# Patient Record
Sex: Male | Born: 1955 | Race: White | Hispanic: No | Marital: Married | Smoking: Former smoker
Health system: Southern US, Community
[De-identification: ages and names within clinical notes are randomized; demographics above are authoritative.]

## PROBLEM LIST (undated history)

## (undated) DIAGNOSIS — N189 Chronic kidney disease, unspecified: Secondary | ICD-10-CM

## (undated) DIAGNOSIS — Z87442 Personal history of urinary calculi: Secondary | ICD-10-CM

## (undated) DIAGNOSIS — E785 Hyperlipidemia, unspecified: Secondary | ICD-10-CM

## (undated) DIAGNOSIS — I739 Peripheral vascular disease, unspecified: Secondary | ICD-10-CM

## (undated) DIAGNOSIS — I219 Acute myocardial infarction, unspecified: Secondary | ICD-10-CM

## (undated) DIAGNOSIS — D494 Neoplasm of unspecified behavior of bladder: Secondary | ICD-10-CM

## (undated) DIAGNOSIS — I2510 Atherosclerotic heart disease of native coronary artery without angina pectoris: Secondary | ICD-10-CM

## (undated) DIAGNOSIS — C679 Malignant neoplasm of bladder, unspecified: Secondary | ICD-10-CM

## (undated) DIAGNOSIS — I700 Atherosclerosis of aorta: Secondary | ICD-10-CM

## (undated) DIAGNOSIS — C801 Malignant (primary) neoplasm, unspecified: Secondary | ICD-10-CM

## (undated) DIAGNOSIS — I10 Essential (primary) hypertension: Secondary | ICD-10-CM

## (undated) DIAGNOSIS — N186 End stage renal disease: Secondary | ICD-10-CM

## (undated) DIAGNOSIS — I779 Disorder of arteries and arterioles, unspecified: Secondary | ICD-10-CM

## (undated) DIAGNOSIS — Z973 Presence of spectacles and contact lenses: Secondary | ICD-10-CM

## (undated) HISTORY — DX: Malignant (primary) neoplasm, unspecified: C80.1

## (undated) SURGERY — Surgical Case
Anesthesia: *Unknown

## (undated) SURGICAL SUPPLY — 3 items
BALLN LUTONIX 018 4X150X130 (BALLOONS) ×2 IMPLANT
BALLN LUTONIX 018 5X220X130 (BALLOONS) ×1 IMPLANT
BALLN ULTRVRSE 2.5X300X150 (BALLOONS) ×1 IMPLANT

## (undated) NOTE — *Deleted (*Deleted)
   11/16/2019  CC: No chief complaint on file.  Urologic history: -Hospitalized12/16/2018-12/21/2018to [place] for hematuria and acute kidney injury with creatinine 6.9 and CT showing severe bilateral hydronephrosis and hyperdense material in the bladder with an antecedent 1 year history of intermittent gross hematuria and voiding difficulty.  -Initial placement bilateral percutaneous nephrostomy tubes 02/07/2017  -Cystoscopy with clot evacuation; TURBT performed 02/09/2017  -Intraoperative findings extensive papillary tumor estimated >5 cm occupying the entire trigone and obscuring both ureteral orifice ease. The tumor was incompletely resected  -Pathology:Taurothelial carcinoma of the bladder, low-grade  -Repeat TURBT 03/15/2017 [facility] with findings of extensive tumor over the entire right lateral wall and dome. Estimated approximately 90% of the tumor resected.  -Pathology low-grade urothelial carcinoma; equivocal subepithelial invasion; no muscle invasion noted  -Nephrostomy tubes converted to bilateral ureteral stents 04/06/2017  -Cystoscopy under anesthesia 04/25/2017; 4 cm papillary tumor posterior wall of the bladder entirely resected  -Pathology Taurothelial carcinoma, low-grade; muscle present and not involved  -Ureteral stents removed 05/02/2017  -6-week course of intravesical BCG recommended however patient did not follow-up stating he moved to [place] and did not establish urologic care in the area.  HPI: Anthony Hess is a 15 y.o. male who returns for a cystoscopy.   - Office surveillance cystoscopy performed late August 2020 showed a large amount of sediment and suboptimal visualization.  -He was scheduled for cystoscopy under anesthesia with possible bladder biopsy/TURBT. -He was scheduled in late October 2020 however his preop ECG was abnormal and Myoview showed <30% EF with anterior, apical  and septal scars.   -Cardiac catheterization showed significant two-vessel coronary artery disease and he subsequently underwent CABG in [facility] on 11/4.   -He saw his CT surgeon in late November and was cleared for his urologic procedure. -S/p cystoscopy with bladder biopsies and bilateral retrograde pyelograms with interpretation on 05/15/2019.  -Pathology noted urothelial mucosa with focal cystitis cystica. Negative for atypia and malignancy.    There were no vitals taken for this visit. NED. A&Ox3.   No respiratory distress   Abd soft, NT, ND Normal phallus with bilateral descended testicles  Cystoscopy Procedure Note  Patient identification was confirmed, informed consent was obtained, and patient was prepped using Betadine solution.  Lidocaine jelly was administered per urethral meatus.     Pre-Procedure: - Inspection reveals a normal caliber ureteral meatus.  Procedure: The flexible cystoscope was introduced without difficulty - No urethral strictures/lesions are present. - {Blank multiple:19197::"Enlarged","Surgically absent","Normal"} prostate *** - {Blank multiple:19197::"Normal","Elevated","Tight"} bladder neck - Bilateral ureteral orifices identified - Bladder mucosa  reveals no ulcers, tumors, or lesions - No bladder stones - No trabeculation  Retroflexion shows ***   Post-Procedure: - Patient tolerated the procedure well  Assessment/ Plan:   No follow-ups on file.  Selena Batten

---

## 2017-02-22 HISTORY — PX: TUMOR REMOVAL: SHX12

## 2017-07-07 ENCOUNTER — Ambulatory Visit: Payer: Self-pay | Admitting: Physician Assistant

## 2017-07-26 ENCOUNTER — Ambulatory Visit: Payer: Medicaid Other | Admitting: Physician Assistant

## 2018-01-02 ENCOUNTER — Ambulatory Visit: Payer: Medicaid Other | Admitting: Physician Assistant

## 2018-01-02 NOTE — Progress Notes (Deleted)
       Patient: Anthony Hess, Male    DOB: [birth year], 62 y.o.   MRN: [id number] Visit Date: 01/02/2018  Today's Provider: Trinna Post, PA-C   No chief complaint on file.  Subjective:    New Patient Appointment Anthony Hess is a 62 y.o. male who presents today for new patient appointment. He feels {DESC; WELL/FAIRLY WELL/POORLY:18703}. He reports exercising ***. He reports he is sleeping {DESC; WELL/FAIRLY WELL/POORLY:18703}.  -----------------------------------------------------------------   Review of Systems  Constitutional: Negative.   HENT: Negative.   Eyes: Negative.   Respiratory: Negative.   Cardiovascular: Negative.   Gastrointestinal: Negative.   Endocrine: Negative.   Genitourinary: Negative.   Musculoskeletal: Negative.   Skin: Negative.   Allergic/Immunologic: Negative.   Neurological: Negative.   Hematological: Negative.   Psychiatric/Behavioral: Negative.     Social History He   Social History   Socioeconomic History  . Marital status: Single    Spouse name: Not on file  . Number of children: Not on file  . Years of education: Not on file  . Highest education level: Not on file  Occupational History  . Not on file  Social Needs  . Financial resource strain: Not on file  . Food insecurity:    Worry: Not on file    Inability: Not on file  . Transportation needs:    Medical: Not on file    Non-medical: Not on file  Tobacco Use  . Smoking status: Not on file  Substance and Sexual Activity  . Alcohol use: Not on file  . Drug use: Not on file  . Sexual activity: Not on file  Lifestyle  . Physical activity:    Days per week: Not on file    Minutes per session: Not on file  . Stress: Not on file  Relationships  . Social connections:    Talks on phone: Not on file    Gets together: Not on file    Attends religious service: Not on file    Active member of club or organization: Not on file    Attends meetings of clubs or  organizations: Not on file    Relationship status: Not on file  Other Topics Concern  . Not on file  Social History Narrative  . Not on file    There are no active problems to display for this patient.   *** The histories are not reviewed yet. Please review them in the "History" navigator section and refresh this [place].  Family History  No family status information on file.   His family history is not on file.     Allergies not on file  Previous Medications   No medications on file    Patient Care Team: Paulene Floor as PCP - General (Physician Assistant)      Objective:   Vitals: There were no vitals taken for this visit.   Physical Exam   Depression Screen No flowsheet data found.    Assessment & Plan:     Routine Health Maintenance and Physical Exam  Exercise Activities and Dietary recommendations Goals   None      There is no immunization history on file for this patient.  There are no preventive care reminders to display for this patient.   Discussed health benefits of physical activity, and encouraged him to engage in regular exercise appropriate for his age and condition.    --------------------------------------------------------------------

## 2018-03-15 ENCOUNTER — Ambulatory Visit: Payer: Medicaid Other | Admitting: Family Medicine

## 2018-03-20 ENCOUNTER — Ambulatory Visit: Payer: Medicaid Other | Admitting: Family Medicine

## 2018-03-20 ENCOUNTER — Encounter: Payer: Self-pay | Admitting: Family Medicine

## 2018-03-20 VITALS — BP 156/99 | HR 69 | Temp 97.5°F | Ht 63.6 in | Wt 130.4 lb

## 2018-03-20 DIAGNOSIS — F17210 Nicotine dependence, cigarettes, uncomplicated: Secondary | ICD-10-CM | POA: Diagnosis not present

## 2018-03-20 DIAGNOSIS — Z7689 Persons encountering health services in other specified circumstances: Secondary | ICD-10-CM

## 2018-03-20 DIAGNOSIS — Z8551 Personal history of malignant neoplasm of bladder: Secondary | ICD-10-CM | POA: Diagnosis not present

## 2018-03-20 MED ORDER — NICOTINE 21 MG/24HR TD PT24: 21.0000 mg | MEDICATED_PATCH | Freq: Every day | TRANSDERMAL | 0 refills | Status: DC

## 2018-03-20 NOTE — Assessment & Plan Note (Signed)
Will obtain records from [facility]. Referral placed for further management locally

## 2018-03-20 NOTE — Progress Notes (Signed)
BP (!) 156/99 (BP Location: Right Arm, Cuff Size: Normal)   Pulse 69   Temp (!) 97.5 F (36.4 C)   Ht 5' 3.6" (1.615 m)   Wt 130 lb 6 oz (59.1 kg)   SpO2 98%   BMI 22.66 kg/m    Subjective:    Patient ID: Anthony Hess, male    DOB: 1955-12-14, 63 y.o.   MRN: [id number]  HPI: Anthony Hess is a 63 y.o. male  Chief Complaint  Patient presents with  . Establish Care  . Referral    Patient was diagnosed with bladder cancer in 01/2017, he needs to see a provider that takes medicaid  . Nicotine Dependence    would like to start patches   Here today to establish care.   Was diagnosed in 11/2016 with bladder cancer and had excisional surgery and then some scrapings after that per patient. Was told that he would always have the cancer and require regular scrapings to keep things at [place]. Needs to establish with a local provider - just moved here from [place]. No current sxs.   Tried quitting smoking last year with chantix and patches but stopped them and started back up smoking. Did not have any side effects. Wanting to restart patches.   No other known medical problems. Has not had a CPE in a long time.   Relevant past medical, surgical, family and social history reviewed and updated as indicated. Interim medical history since our last visit reviewed. Allergies and medications reviewed and updated.  Review of Systems  Per HPI unless specifically indicated above     Objective:    BP (!) 156/99 (BP Location: Right Arm, Cuff Size: Normal)   Pulse 69   Temp (!) 97.5 F (36.4 C)   Ht 5' 3.6" (1.615 m)   Wt 130 lb 6 oz (59.1 kg)   SpO2 98%   BMI 22.66 kg/m   Wt Readings from Last 3 Encounters:  03/20/18 130 lb 6 oz (59.1 kg)    Physical Exam Vitals signs and nursing note reviewed.  Constitutional:      Appearance: Normal appearance.  HENT:     Head: Atraumatic.  Eyes:     Extraocular Movements: Extraocular movements intact.     Conjunctiva/sclera: Conjunctivae normal.    Neck:     Musculoskeletal: Normal range of motion and neck supple.  Cardiovascular:     Rate and Rhythm: Normal rate and regular rhythm.  Pulmonary:     Effort: Pulmonary effort is normal.     Breath sounds: Normal breath sounds.  Musculoskeletal: Normal range of motion.  Skin:    General: Skin is warm and dry.  Neurological:     General: No focal deficit present.     Mental Status: He is oriented to person, place, and time.  Psychiatric:        Mood and Affect: Mood normal.        Thought Content: Thought content normal.        Judgment: Judgment normal.     No results found for this or any previous visit.    Assessment & Plan:   Problem List Items Addressed This Visit      Other   Cigarette smoker - Primary    Restart patches, discussed needing to be set on smoking prior to starting treatment. Follow up on prgress at upcoming CPE      History of bladder cancer    Will obtain records from [facility]. Referral  placed for further management locally      Relevant Orders   Ambulatory referral to Urology    Other Visit Diagnoses    Encounter to establish care           Follow up plan: Return for CPE.

## 2018-03-20 NOTE — Assessment & Plan Note (Signed)
Restart patches, discussed needing to be set on smoking prior to starting treatment. Follow up on prgress at upcoming CPE

## 2018-03-23 ENCOUNTER — Encounter: Payer: Medicaid Other | Admitting: Family Medicine

## 2018-03-24 ENCOUNTER — Telehealth: Payer: Self-pay | Admitting: Family Medicine

## 2018-03-24 NOTE — Telephone Encounter (Signed)
Copied from [place] [phone]. Topic: Quick Communication - Rx Refill/Question >> Mar 24, 2018  2:10 PM Sheppard Coil, [employer] wrote: Medication:  nicotine (NICODERM CQ - DOSED IN MG/24 HOURS) 21 mg/24hr patch  Pt states that Medicaid doesn't cover this patch.  Pt wants to know if there is anything else that can be called in for him.  Preferred Pharmacy (with phone number or street name): [place] (N), [place] - [place] [phone] (Phone) [phone] (Fax)  Agent: Please be advised that RX refills may take up to 3 business days. We ask that you follow-up with your pharmacy.

## 2018-03-27 ENCOUNTER — Encounter: Payer: Self-pay | Admitting: Family Medicine

## 2018-03-27 ENCOUNTER — Ambulatory Visit: Payer: Medicaid Other | Admitting: Family Medicine

## 2018-03-27 VITALS — BP 163/102 | HR 75 | Temp 98.3°F | Ht 63.6 in | Wt 131.5 lb

## 2018-03-27 DIAGNOSIS — R221 Localized swelling, mass and lump, neck: Secondary | ICD-10-CM

## 2018-03-27 DIAGNOSIS — Z1211 Encounter for screening for malignant neoplasm of colon: Secondary | ICD-10-CM

## 2018-03-27 DIAGNOSIS — I10 Essential (primary) hypertension: Secondary | ICD-10-CM

## 2018-03-27 DIAGNOSIS — Z0000 Encounter for general adult medical examination without abnormal findings: Secondary | ICD-10-CM | POA: Diagnosis not present

## 2018-03-27 MED ORDER — LISINOPRIL 10 MG PO TABS: 10.0000 mg | ORAL_TABLET | Freq: Every day | ORAL | 0 refills | Status: DC

## 2018-03-27 NOTE — Telephone Encounter (Signed)
Patient seen in clinic, has started patches

## 2018-03-27 NOTE — Patient Instructions (Signed)
We would like your blood pressures to be consistently under 140/90 but not lower than 100-110/60s-70s

## 2018-03-27 NOTE — Assessment & Plan Note (Signed)
Will start lisinopril and recheck in 1 month

## 2018-03-27 NOTE — Progress Notes (Signed)
BP (!) 163/102 (BP Location: Left Arm, Patient Position: Sitting, Cuff Size: Normal)   Pulse 75   Temp 98.3 F (36.8 C)   Ht 5' 3.6" (1.615 m)   Wt 131 lb 8 oz (59.6 kg)   SpO2 98%   BMI 22.86 kg/m    Subjective:    Patient ID: Anthony Hess, male    DOB: [birth year], 63 y.o.   MRN: [id number]  HPI: Anthony Hess is a 63 y.o. male presenting on 03/27/2018 for comprehensive medical examination. Current medical complaints include:see below  Interested in smoking cessation. Using the patches sent in last week, started yesterday. So far no issues but has not noticed a difference.  Awaiting establishing care with Urology for his hx of bladder cancer currently in remission per pt. Awaiting records from his Urologist in [place].   Has a cyst on the left side of his neck that he states has been there for years that he would like removed. Has never become infected but is a concern to patient.   No hx of BP issues and has never been treated with BP medicines. Was noted to have elevated BP reading at new pt visit last week and again today. Denies Cp, SOB, HAs, dizziness.   He currently lives with: Interim Problems from his last visit: yes  Depression Screen done today and results listed below:  Depression screen [facility] 2/9 03/20/2018  Decreased Interest 0  Down, Depressed, Hopeless 0  PHQ - 2 Score 0  Altered sleeping 0  Tired, decreased energy 3  Change in appetite 0  Feeling bad or failure about yourself  0  Trouble concentrating 0  Moving slowly or fidgety/restless 0  Suicidal thoughts 0  PHQ-9 Score 3  Difficult doing work/chores Not difficult at all    The patient does not have a history of falls. I did not complete a risk assessment for falls. A plan of care for falls was not documented.   Past Medical History:  Past Medical History:  Diagnosis Date  . Cancer [facility])    Bladder    Surgical History:  Past Surgical History:  Procedure Laterality Date  . TUMOR REMOVAL  2019   Bladder    Medications:  Current Outpatient Medications on File Prior to Visit  Medication Sig  . nicotine (NICODERM CQ - DOSED IN MG/24 HOURS) 21 mg/24hr patch Place 1 patch (21 mg total) onto the skin daily.   No current facility-administered medications on file prior to visit.     Allergies:  No Known Allergies  Social History:  Social History   Socioeconomic History  . Marital status: Single    Spouse name: Not on file  . Number of children: Not on file  . Years of education: Not on file  . Highest education level: Not on file  Occupational History  . Not on file  Social Needs  . Financial resource strain: Not on file  . Food insecurity:    Worry: Not on file    Inability: Not on file  . Transportation needs:    Medical: Not on file    Non-medical: Not on file  Tobacco Use  . Smoking status: Former Smoker    Packs/day: 0.50    Types: Cigarettes    Last attempt to quit: 03/25/2018  . Smokeless tobacco: Never Used  Substance and Sexual Activity  . Alcohol use: Never    Frequency: Never  . Drug use: Never  . Sexual activity: Not Currently  Lifestyle  .  Physical activity:    Days per week: Not on file    Minutes per session: Not on file  . Stress: Not on file  Relationships  . Social connections:    Talks on phone: Not on file    Gets together: Not on file    Attends religious service: Not on file    Active member of club or organization: Not on file    Attends meetings of clubs or organizations: Not on file    Relationship status: Not on file  . Intimate partner violence:    Fear of current or ex partner: Not on file    Emotionally abused: Not on file    Physically abused: Not on file    Forced sexual activity: Not on file  Other Topics Concern  . Not on file  Social History Narrative  . Not on file   Social History   Tobacco Use  Smoking Status Former Smoker  . Packs/day: 0.50  . Types: Cigarettes  . Last attempt to quit: 03/25/2018  Smokeless  Tobacco Never Used   Social History   Substance and Sexual Activity  Alcohol Use Never  . Frequency: Never    Family History:  Family History  Family history unknown: Yes    Past medical history, surgical history, medications, allergies, family history and social history reviewed with patient today and changes made to appropriate areas of the chart.   Review of Systems - General ROS: negative Psychological ROS: negative Ophthalmic ROS: negative ENT ROS: negative Allergy and Immunology ROS: negative Hematological and Lymphatic ROS: negative Endocrine ROS: negative Respiratory ROS: no cough, shortness of breath, or wheezing Cardiovascular ROS: no chest pain or dyspnea on exertion Gastrointestinal ROS: no abdominal pain, change in bowel habits, or black or bloody stools Genito-Urinary ROS: no dysuria, trouble voiding, or hematuria Musculoskeletal ROS: negative Neurological ROS: no TIA or stroke symptoms Dermatological ROS: negative All other ROS negative except what is listed above and in the HPI.      Objective:    BP (!) 163/102 (BP Location: Left Arm, Patient Position: Sitting, Cuff Size: Normal)   Pulse 75   Temp 98.3 F (36.8 C)   Ht 5' 3.6" (1.615 m)   Wt 131 lb 8 oz (59.6 kg)   SpO2 98%   BMI 22.86 kg/m   Wt Readings from Last 3 Encounters:  03/27/18 131 lb 8 oz (59.6 kg)  03/20/18 130 lb 6 oz (59.1 kg)    Physical Exam Vitals signs and nursing note reviewed.  Constitutional:      General: He is not in acute distress.    Appearance: He is well-developed.  HENT:     Head: Atraumatic.     Right Ear: External ear normal.     Left Ear: External ear normal.     Nose: Nose normal.  Eyes:     General: No scleral icterus.    Conjunctiva/sclera: Conjunctivae normal.     Pupils: Pupils are equal, round, and reactive to light.  Neck:     Musculoskeletal: Normal range of motion and neck supple.  Cardiovascular:     Rate and Rhythm: Normal rate and regular  rhythm.     Heart sounds: Normal heart sounds. No murmur.  Pulmonary:     Effort: Pulmonary effort is normal. No respiratory distress.     Breath sounds: Normal breath sounds.  Abdominal:     General: Bowel sounds are normal. There is no distension.     Palpations: Abdomen  is soft. There is no mass.     Tenderness: There is no abdominal tenderness. There is no guarding.  Musculoskeletal: Normal range of motion.        General: No tenderness.  Skin:    General: Skin is warm and dry.     Findings: No rash.     Comments: Large sebaceous cyst left side of neck, non infected  Neurological:     General: No focal deficit present.     Mental Status: He is alert.     Deep Tendon Reflexes: Reflexes are normal and symmetric.  Psychiatric:        Mood and Affect: Mood normal.        Behavior: Behavior normal.        Thought Content: Thought content normal.     No results found for this or any previous visit.    Assessment & Plan:   Problem List Items Addressed This Visit      Cardiovascular and Mediastinum   Essential hypertension    Will start lisinopril and recheck in 1 month      Relevant Medications   lisinopril (PRINIVIL,ZESTRIL) 10 MG tablet    Other Visit Diagnoses    Annual physical exam    -  Primary   Relevant Orders   CBC with Differential/Platelet   Comprehensive metabolic panel   Lipid Panel w/o Chol/HDL Ratio   TSH   UA/M w/rflx Culture, Routine   Screening for colon cancer       Relevant Orders   Cologuard   Neck mass       Referral placed to dermatology for removal   Relevant Orders   Ambulatory referral to Dermatology       Discussed aspirin prophylaxis for myocardial infarction prevention and decision was it was not indicated  LABORATORY TESTING:  Health maintenance labs ordered today as discussed above.   The natural history of prostate cancer and ongoing controversy regarding screening and potential treatment outcomes of prostate cancer has been  discussed with the patient. The meaning of a false positive PSA and a false negative PSA has been discussed. He indicates understanding of the limitations of this screening test and wishes to proceed with screening PSA testing. Will be managed by his Urologist once established   IMMUNIZATIONS:   - Tdap: Tetanus vaccination status reviewed: refused. - Influenza: Refused - Pneumovax: Refused - Prevnar: Not applicable - HPV: Not applicable - Zostavax vaccine: Refused  SCREENING: - Colonoscopy: cologuard ordered today  Discussed with patient purpose of the colonoscopy is to detect colon cancer at curable precancerous or early stages   PATIENT COUNSELING:    Sexuality: Discussed sexually transmitted diseases, partner selection, use of condoms, avoidance of unintended pregnancy  and contraceptive alternatives.   Advised to avoid cigarette smoking.  I discussed with the patient that most people either abstain from alcohol or drink within safe limits (<=14/week and <=4 drinks/occasion for males, <=7/weeks and <= 3 drinks/occasion for females) and that the risk for alcohol disorders and other health effects rises proportionally with the number of drinks per week and how often a drinker exceeds daily limits.  Discussed cessation/primary prevention of drug use and availability of treatment for abuse.   Diet: Encouraged to adjust caloric intake to maintain  or achieve ideal body weight, to reduce intake of dietary saturated fat and total fat, to limit sodium intake by avoiding high sodium foods and not adding table salt, and to maintain adequate dietary potassium and calcium preferably  from fresh fruits, vegetables, and low-fat dairy products.    stressed the importance of regular exercise  Injury prevention: Discussed safety belts, safety helmets, smoke detector, smoking near bedding or upholstery.   Dental health: Discussed importance of regular tooth brushing, flossing, and dental visits.    Follow up plan: NEXT PREVENTATIVE PHYSICAL DUE IN 1 YEAR. Return in about 4 weeks (around 04/24/2018) for BP.

## 2018-03-28 ENCOUNTER — Telehealth: Payer: Self-pay | Admitting: Family Medicine

## 2018-03-28 DIAGNOSIS — R7989 Other specified abnormal findings of blood chemistry: Secondary | ICD-10-CM

## 2018-03-28 LAB — COMPREHENSIVE METABOLIC PANEL
ALT: 21 IU/L (ref 0–44)
AST: 20 IU/L (ref 0–40)
Albumin/Globulin Ratio: 1.6 (ref 1.2–2.2)
Albumin: 4.2 g/dL (ref 3.8–4.9)
Alkaline Phosphatase: 91 IU/L (ref 39–117)
BUN/Creatinine Ratio: 19 (ref 10–24)
BUN: 62 mg/dL — ABNORMAL HIGH (ref 8–27)
Bilirubin Total: <0.2 mg/dL (ref 0.0–1.2)
CO2: 17 mmol/L — ABNORMAL LOW (ref 20–29)
Calcium: 8.6 mg/dL (ref 8.6–10.2)
Chloride: 110 mmol/L — ABNORMAL HIGH (ref 96–106)
Creatinine, Ser: 3.31 mg/dL — ABNORMAL HIGH (ref 0.76–1.27)
GFR calc Af Amer: 22 mL/min/{1.73_m2} — ABNORMAL LOW (ref >59)
GFR calc non Af Amer: 19 mL/min/{1.73_m2} — ABNORMAL LOW (ref >59)
Globulin, Total: 2.7 g/dL (ref 1.5–4.5)
Glucose: 95 mg/dL (ref 65–99)
Potassium: 5.6 mmol/L — ABNORMAL HIGH (ref 3.5–5.2)
Sodium: 144 mmol/L (ref 134–144)
Total Protein: 6.9 g/dL (ref 6.0–8.5)

## 2018-03-28 LAB — CBC WITH DIFFERENTIAL/PLATELET
Basophils Absolute: 0 10*3/uL (ref 0.0–0.2)
Basos: 1 %
EOS (ABSOLUTE): 0.5 10*3/uL — ABNORMAL HIGH (ref 0.0–0.4)
Eos: 6 %
Hematocrit: 34.3 % — ABNORMAL LOW (ref 37.5–51.0)
Hemoglobin: 10.7 g/dL — ABNORMAL LOW (ref 13.0–17.7)
Immature Grans (Abs): 0 10*3/uL (ref 0.0–0.1)
Immature Granulocytes: 0 %
Lymphocytes Absolute: 1.2 10*3/uL (ref 0.7–3.1)
Lymphs: 15 %
MCH: 26.9 pg (ref 26.6–33.0)
MCHC: 31.2 g/dL — ABNORMAL LOW (ref 31.5–35.7)
MCV: 86 fL (ref 79–97)
Monocytes Absolute: 1.1 10*3/uL — ABNORMAL HIGH (ref 0.1–0.9)
Monocytes: 13 %
Neutrophils Absolute: 5.5 10*3/uL (ref 1.4–7.0)
Neutrophils: 65 %
Platelets: 334 10*3/uL (ref 150–450)
RBC: 3.98 x10E6/uL — ABNORMAL LOW (ref 4.14–5.80)
RDW: 15.3 % (ref 11.6–15.4)
WBC: 8.4 10*3/uL (ref 3.4–10.8)

## 2018-03-28 LAB — LIPID PANEL W/O CHOL/HDL RATIO
Cholesterol, Total: 151 mg/dL (ref 100–199)
HDL: 41 mg/dL (ref >39)
LDL Calculated: 83 mg/dL (ref 0–99)
Triglycerides: 134 mg/dL (ref 0–149)
VLDL Cholesterol Cal: 27 mg/dL (ref 5–40)

## 2018-03-28 LAB — TSH: TSH: 3.97 u[IU]/mL (ref 0.450–4.500)

## 2018-03-28 MED ORDER — AMLODIPINE BESYLATE 5 MG PO TABS: 5.0000 mg | ORAL_TABLET | Freq: Every day | ORAL | 0 refills | Status: DC

## 2018-03-28 NOTE — Telephone Encounter (Signed)
Patient notified.  Medication was cancelled with the pharmacy.

## 2018-03-28 NOTE — Telephone Encounter (Signed)
Called and spoke to [place]. Relayed information in message. She is going to try to get patient to go to [facility] but she does not know if she can convince him. Patient is very concerned about money, recommenced they come get application for charity care. Ebony Hail would like to know if he should d/c his caffeine pills. Please advise.

## 2018-03-28 NOTE — Telephone Encounter (Signed)
Called and spoke with patient about his results - significantly elevated creatinine of 3.3 with GFR of 19. States he's got no hx of kidney issues and stays hydrated. Will place urgent referral to Nephrology, recommended ER for immediate evaluation in meantime. Does note his urine has been very cloudy and strange colored recently. Also anemic with a hg of 10.7, will recheck next week and send to Hematology if still abnormal.  Pt notes he never picked up the lisinopril that was sent over yesterday, discussed not picking this one up and starting amlodipine instead. Pt agreeable to all of these plans.   *Please call pharmacy and cancel the lisinopril from yesterday*

## 2018-03-28 NOTE — Telephone Encounter (Signed)
Pt and his room mate Ebony Hail is returning call. Pt would like for the office to discuss his results with his roommate also because he doesn't have a clear understanding. Roommate is on DPR. Call back: [phone] - Ebony Hail

## 2018-03-28 NOTE — Telephone Encounter (Signed)
100% he should stop caffeine pills

## 2018-03-28 NOTE — Telephone Encounter (Signed)
Routing to Rachel 

## 2018-03-29 ENCOUNTER — Other Ambulatory Visit: Payer: Self-pay | Admitting: Nephrology

## 2018-03-29 ENCOUNTER — Ambulatory Visit: Payer: Medicaid Other

## 2018-03-29 DIAGNOSIS — N179 Acute kidney failure, unspecified: Secondary | ICD-10-CM

## 2018-03-29 NOTE — Telephone Encounter (Signed)
Called and left a detailed message for patient, patient is going to the kidney doctor at 945am this morning, they will see what this doctor recommends and give [place] a call back about the ER,

## 2018-03-29 NOTE — Telephone Encounter (Signed)
Pt's ER visit should be fully covered by his medicaid, please call him and let him know that his insurance should cover that and recommend again that he goes

## 2018-03-29 NOTE — Telephone Encounter (Signed)
Attempted to notify Anthony Hess. Unable to leave VM.

## 2018-03-30 LAB — UA/M W/RFLX CULTURE, ROUTINE
Bilirubin, UA: NEGATIVE
Glucose, UA: NEGATIVE
Ketones, UA: NEGATIVE
Nitrite, UA: NEGATIVE
Specific Gravity, UA: 1.015 (ref 1.005–1.030)
Urobilinogen, Ur: 0.2 mg/dL (ref 0.2–1.0)
pH, UA: 6 (ref 5.0–7.5)

## 2018-03-30 LAB — MICROSCOPIC EXAMINATION
Bacteria, UA: NONE SEEN
Epithelial Cells (non renal): NONE SEEN /hpf (ref 0–10)
RBC, UA: NONE SEEN /hpf (ref 0–2)

## 2018-03-30 LAB — URINE CULTURE, REFLEX

## 2018-03-31 ENCOUNTER — Other Ambulatory Visit: Payer: Self-pay | Admitting: Family Medicine

## 2018-03-31 MED ORDER — AMOXICILLIN-POT CLAVULANATE 875-125 MG PO TABS: 1.0000 | ORAL_TABLET | Freq: Two times a day (BID) | ORAL | 0 refills | Status: DC

## 2018-04-03 ENCOUNTER — Ambulatory Visit
Admission: RE | Admit: 2018-04-03 | Discharge: 2018-04-03 | Disposition: A | Discharge status: Home / Self Care | Payer: Medicaid Other | Source: Ambulatory Visit | Attending: Nephrology | Admitting: Nephrology

## 2018-04-03 ENCOUNTER — Other Ambulatory Visit: Payer: Self-pay

## 2018-04-03 ENCOUNTER — Ambulatory Visit: Payer: Self-pay | Admitting: Family Medicine

## 2018-04-03 ENCOUNTER — Inpatient Hospital Stay
Admission: AD | Admit: 2018-04-03 | Discharge: 2018-04-05 | DRG: 690 | Disposition: A | Discharge status: Home / Self Care | Payer: Medicaid Other | Source: Ambulatory Visit | Attending: Internal Medicine | Admitting: Internal Medicine

## 2018-04-03 ENCOUNTER — Inpatient Hospital Stay: Payer: Medicaid Other

## 2018-04-03 DIAGNOSIS — I129 Hypertensive chronic kidney disease with stage 1 through stage 4 chronic kidney disease, or unspecified chronic kidney disease: Secondary | ICD-10-CM | POA: Diagnosis present

## 2018-04-03 DIAGNOSIS — B961 Klebsiella pneumoniae [K. pneumoniae] as the cause of diseases classified elsewhere: Secondary | ICD-10-CM | POA: Diagnosis present

## 2018-04-03 DIAGNOSIS — N1339 Other hydronephrosis: Secondary | ICD-10-CM | POA: Diagnosis not present

## 2018-04-03 DIAGNOSIS — Z87442 Personal history of urinary calculi: Secondary | ICD-10-CM

## 2018-04-03 DIAGNOSIS — E872 Acidosis: Secondary | ICD-10-CM | POA: Diagnosis present

## 2018-04-03 DIAGNOSIS — N136 Pyonephrosis: Principal | ICD-10-CM | POA: Diagnosis present

## 2018-04-03 DIAGNOSIS — N179 Acute kidney failure, unspecified: Secondary | ICD-10-CM

## 2018-04-03 DIAGNOSIS — N2581 Secondary hyperparathyroidism of renal origin: Secondary | ICD-10-CM | POA: Diagnosis present

## 2018-04-03 DIAGNOSIS — N184 Chronic kidney disease, stage 4 (severe): Secondary | ICD-10-CM | POA: Diagnosis present

## 2018-04-03 DIAGNOSIS — K769 Liver disease, unspecified: Secondary | ICD-10-CM | POA: Diagnosis present

## 2018-04-03 DIAGNOSIS — R229 Localized swelling, mass and lump, unspecified: Secondary | ICD-10-CM

## 2018-04-03 DIAGNOSIS — Z87891 Personal history of nicotine dependence: Secondary | ICD-10-CM | POA: Diagnosis not present

## 2018-04-03 DIAGNOSIS — R160 Hepatomegaly, not elsewhere classified: Secondary | ICD-10-CM

## 2018-04-03 DIAGNOSIS — Z8551 Personal history of malignant neoplasm of bladder: Secondary | ICD-10-CM | POA: Diagnosis not present

## 2018-04-03 DIAGNOSIS — Z79899 Other long term (current) drug therapy: Secondary | ICD-10-CM

## 2018-04-03 DIAGNOSIS — E875 Hyperkalemia: Secondary | ICD-10-CM | POA: Diagnosis present

## 2018-04-03 DIAGNOSIS — N1330 Unspecified hydronephrosis: Secondary | ICD-10-CM | POA: Diagnosis present

## 2018-04-03 DIAGNOSIS — IMO0002 Reserved for concepts with insufficient information to code with codable children: Secondary | ICD-10-CM

## 2018-04-03 LAB — URINALYSIS, COMPLETE (UACMP) WITH MICROSCOPIC
Bilirubin Urine: NEGATIVE
Glucose, UA: NEGATIVE mg/dL
Ketones, ur: NEGATIVE mg/dL
Nitrite: NEGATIVE
Protein, ur: 30 mg/dL — AB
Specific Gravity, Urine: 1.01 (ref 1.005–1.030)
Squamous Epithelial / HPF: NONE SEEN (ref 0–5)
WBC, UA: >50 WBC/hpf — ABNORMAL HIGH (ref 0–5)
pH: 6 (ref 5.0–8.0)

## 2018-04-03 LAB — BASIC METABOLIC PANEL
Anion gap: 5 (ref 5–15)
BUN: 66 mg/dL — ABNORMAL HIGH (ref 6–20)
CO2: 19 mmol/L — ABNORMAL LOW (ref 22–32)
Calcium: 8.1 mg/dL — ABNORMAL LOW (ref 8.9–10.3)
Chloride: 117 mmol/L — ABNORMAL HIGH (ref 98–111)
Creatinine, Ser: 2.86 mg/dL — ABNORMAL HIGH (ref 0.61–1.24)
GFR calc Af Amer: 26 mL/min — ABNORMAL LOW (ref >60)
GFR calc non Af Amer: 23 mL/min — ABNORMAL LOW (ref >60)
Glucose, Bld: 124 mg/dL — ABNORMAL HIGH (ref 70–99)
Potassium: 4.2 mmol/L (ref 3.5–5.1)
Sodium: 141 mmol/L (ref 135–145)

## 2018-04-03 LAB — CBC
HCT: 32.1 % — ABNORMAL LOW (ref 39.0–52.0)
Hemoglobin: 9.8 g/dL — ABNORMAL LOW (ref 13.0–17.0)
MCH: 26.8 pg (ref 26.0–34.0)
MCHC: 30.5 g/dL (ref 30.0–36.0)
MCV: 87.9 fL (ref 80.0–100.0)
Platelets: 284 10*3/uL (ref 150–400)
RBC: 3.65 MIL/uL — ABNORMAL LOW (ref 4.22–5.81)
RDW: 15.3 % (ref 11.5–15.5)
WBC: 7.3 10*3/uL (ref 4.0–10.5)
nRBC: 0 % (ref 0.0–0.2)

## 2018-04-03 MED ORDER — ONDANSETRON HCL 4 MG PO TABS: 4.0000 mg | ORAL_TABLET | Freq: Four times a day (QID) | ORAL | Status: DC | PRN

## 2018-04-03 MED ORDER — HYDROCODONE-ACETAMINOPHEN 5-325 MG PO TABS: 1.0000 | ORAL_TABLET | ORAL | Status: DC | PRN

## 2018-04-03 MED ORDER — HYDRALAZINE HCL 20 MG/ML IJ SOLN
10.0000 mg | Freq: Four times a day (QID) | INTRAMUSCULAR | Status: DC | PRN
  Administered 2018-04-03 – 2018-04-04 (×3): 10 mg via INTRAVENOUS
  Filled 2018-04-03 (×4): qty 1

## 2018-04-03 MED ORDER — ACETAMINOPHEN 325 MG PO TABS: 650.0000 mg | ORAL_TABLET | Freq: Four times a day (QID) | ORAL | Status: DC | PRN

## 2018-04-03 MED ORDER — ALBUTEROL SULFATE (2.5 MG/3ML) 0.083% IN NEBU: 2.5000 mg | INHALATION_SOLUTION | RESPIRATORY_TRACT | Status: DC | PRN

## 2018-04-03 MED ORDER — SODIUM CHLORIDE 0.9 % IV SOLN
INTRAVENOUS | Status: AC
  Administered 2018-04-03 – 2018-04-04 (×3): via INTRAVENOUS

## 2018-04-03 MED ORDER — BISACODYL 5 MG PO TBEC: 5.0000 mg | DELAYED_RELEASE_TABLET | Freq: Every day | ORAL | Status: DC | PRN

## 2018-04-03 MED ORDER — AMLODIPINE BESYLATE 5 MG PO TABS: 5.0000 mg | ORAL_TABLET | Freq: Every day | ORAL | Status: DC

## 2018-04-03 MED ORDER — ACETAMINOPHEN 650 MG RE SUPP: 650.0000 mg | Freq: Four times a day (QID) | RECTAL | Status: DC | PRN

## 2018-04-03 MED ORDER — SENNOSIDES-DOCUSATE SODIUM 8.6-50 MG PO TABS: 1.0000 | ORAL_TABLET | Freq: Every evening | ORAL | Status: DC | PRN

## 2018-04-03 MED ORDER — HEPARIN SODIUM (PORCINE) 5000 UNIT/ML IJ SOLN
5000.0000 [IU] | Freq: Three times a day (TID) | INTRAMUSCULAR | Status: DC
  Administered 2018-04-03 – 2018-04-05 (×6): 5000 [IU] via SUBCUTANEOUS
  Filled 2018-04-03 (×6): qty 1

## 2018-04-03 MED ORDER — ONDANSETRON HCL 4 MG/2ML IJ SOLN: 4.0000 mg | Freq: Four times a day (QID) | INTRAMUSCULAR | Status: DC | PRN

## 2018-04-03 MED ORDER — NICOTINE 21 MG/24HR TD PT24
21.0000 mg | MEDICATED_PATCH | Freq: Every day | TRANSDERMAL | Status: DC
  Administered 2018-04-04 – 2018-04-05 (×2): 21 mg via TRANSDERMAL
  Filled 2018-04-03 (×2): qty 1

## 2018-04-03 NOTE — Consult Note (Signed)
Urology Consult  Chief Complaint: Kidneys not working  History of Present Illness: Fritz Cauthon is a 63 y.o. year old seen in consultation at request of Dr. Bridgett Larsson for evaluation of bilateral hydronephrosis and an elevated creatinine.  He states he was diagnosed with carcinoma of the bladder while living in [place] in December 2018.  He apparently underwent TURBT and after the procedure had bilateral nephrostomy tubes placed which were indwelling for approximately 6 weeks.  He states he never had a follow-up visit with his urologist and had no additional treatment or follow-up because he moved to [place] in April 2019.    He was seen at [facility] in late January 2020.  He had a creatinine drawn on 03/27/2018 which was 3.31 with an estimated GFR of 19.  He was referred to nephrology and saw Dr. [name] today.  A renal ultrasound today showed severe bilateral hydronephrosis.  PVR was 71 mL.  Bilateral ureteral jets were identified.  No obvious tumor was identified in the bladder however there was bladder wall thickening.  He presently denies flank or abdominal pain.  He has no bothersome lower urinary tract symptoms and denies gross hematuria.  Recent urine culture grew Klebsiella.  His urine today does show significant pyuria.   Past Medical History:  Diagnosis Date  . [facility])    Bladder    Past Surgical History:  Procedure Laterality Date  . TUMOR REMOVAL  2019   Bladder    Home Medications:  Current Meds  Medication Sig  . amLODipine (NORVASC) 5 MG tablet Take 1 tablet (5 mg total) by mouth daily.  . nicotine (NICODERM CQ - DOSED IN MG/24 HOURS) 21 mg/24hr patch Place 1 patch (21 mg total) onto the skin daily.    Allergies: No Known Allergies  Family History  Family history unknown: Yes    Social History:  reports that he quit smoking 9 days ago. His smoking use included cigarettes. He smoked 0.50 packs per day. He has never used smokeless  tobacco. He reports that he does not drink alcohol or use drugs.  ROS: A complete review of systems was performed.  All systems are negative except for pertinent findings as noted.  Physical Exam:  Vital signs in last 24 hours: Temp:  [97.8 F (36.6 C)] 97.8 F (36.6 C) (02/10 1416) Pulse Rate:  [56-69] 66 (02/10 1821) Resp:  [20] 20 (02/10 1416) BP: (146-163)/(95-103) 146/97 (02/10 1821) SpO2:  [99 %] 99 % (02/10 1416) Weight:  [58.7 kg] 58.7 kg (02/10 1629) Constitutional:  Alert and oriented, No acute distress HEENT: [place] AT, moist mucus membranes.  Trachea midline, no masses Cardiovascular: Regular rate and rhythm, no clubbing, cyanosis, or edema. Respiratory: Normal respiratory effort, lungs clear bilaterally GI: Abdomen is soft, nontender, nondistended, no abdominal masses GU: No CVA tenderness Skin: No rashes, bruises or suspicious lesions Lymph: No cervical or inguinal adenopathy Neurologic: Grossly intact, no focal deficits, moving all 4 extremities Psychiatric: Normal mood and affect   Laboratory Data:  Recent Labs    04/03/18 1458  WBC 7.3  HGB 9.8*  HCT 32.1*   Recent Labs    04/03/18 1458  NA 141  K 4.2  CL 117*  CO2 19*  GLUCOSE 124*  BUN 66*  CREATININE 2.86*  CALCIUM 8.1*    Results for orders placed or performed in visit on 03/27/18  Microscopic Examination     Status: None   Collection Time: 03/27/18  4:30  PM  Result Value Ref Range Status   WBC, UA >30W 0 - 5 /hpf Final   RBC, UA None seen 0 - 2 /hpf Final   Epithelial Cells (non renal) None seen 0 - 10 /hpf Final   Bacteria, UA None seen None seen/Few Final  Urine Culture, Reflex     Status: Abnormal   Collection Time: 03/27/18  4:30 PM  Result Value Ref Range Status   Urine Culture, Routine Final report (A)  Final   Organism ID, Bacteria Klebsiella pneumoniae (A)  Final    Comment: Greater than 100,000 colony forming units per mL Cefazolin <=4 ug/mL Cefazolin with an MIC <=16 predicts  susceptibility to the oral agents cefaclor, cefdinir, cefpodoxime, cefprozil, cefuroxime, cephalexin, and loracarbef when used for therapy of uncomplicated urinary tract infections due to E. coli, Klebsiella pneumoniae, and Proteus mirabilis.    Antimicrobial Susceptibility Comment  Final    Comment:       ** S = Susceptible; I = Intermediate; R = Resistant **                    P = Positive; N = Negative             MICS are expressed in micrograms per mL    Antibiotic                 RSLT#1    RSLT#2    RSLT#3    RSLT#4 Amoxicillin/Clavulanic Acid    S Ampicillin                     R Cefepime                       S Ceftriaxone                    S Cefuroxime                     S Ciprofloxacin                  S Ertapenem                      S Gentamicin                     S Imipenem                       S Levofloxacin                   S Meropenem                      S Nitrofurantoin                 S Piperacillin/Tazobactam        S Tetracycline                   S Tobramycin                     S Trimethoprim/Sulfa             S      Radiologic Imaging: Images were personally reviewed Ct Abdomen Pelvis Wo Contrast  Result Date: 04/03/2018 CLINICAL DATA:  Hydronephrosis.  Reported history of bladder cancer. EXAM: CT ABDOMEN AND PELVIS WITHOUT CONTRAST TECHNIQUE:  Multidetector CT imaging of the abdomen and pelvis was performed following the standard protocol without IV contrast. COMPARISON:  04/03/2018 ultrasound FINDINGS: Lower chest: Unremarkable Hepatobiliary: 2.8 by 2.0 cm lesion in the right hepatic lobe, internal density 37 Hounsfield units, image 11/2. 2.2 by 1.9 cm lesion in segment 4 of the liver, internal density 40 Hounsfield units, image 16/2. 0.6 cm hypodense lesion inferiorly in the right hepatic lobe, image 37/2. Questionable 6 mm lesion in the right hepatic lobe, image 22/2. 0.9 cm fluid density lesion in the right hepatic lobe, image 20/2. Slightly  contracted gallbladder with dependent densities which could be gallstones or from sludge. Pancreas: Ill-defined pancreatic margins. Some of this may be from paucity of intra-abdominal adipose tissue. Spleen: Unremarkable Adrenals/Urinary Tract: Both adrenal glands appear normal. There is considerable bilateral hydronephrosis with bilateral hydroureter extending down to the urinary bladder without well-defined calculi. Cellules/lobulation along the urinary bladder noted. I do not see a well-defined tumor put some of the bladder wall appears mildly thickened including posteriorly. The bladder is currently not distended. Stomach/Bowel: Paucity of intra-adipose tissues and the lack of oral contrast makes separation of adjacent bowel loops problematic. Appendix is visualized and appears normal. Prominent stool throughout the colon favors constipation. Vascular/Lymphatic: Aortoiliac atherosclerotic vascular disease. No definite adenopathy is identified. Reproductive: Unremarkable Other: No supplemental non-categorized findings. Musculoskeletal: Considerable levoconvex lumbar scoliosis. Degenerative subcortical cyst formation or erosions along both acetabular rims superiorly. Grade 1 degenerative anterolisthesis at L4-5 with disc uncovering and facet arthropathy resulting in mild left foraminal impingement at the L4-5 level. 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 IMPRESSION: 1. Prominent bilateral hydronephrosis and hydroureter extending down to the bladder. The urinary bladder has an unusual contour with appearance of peripheral cellules and some bladder wall thickening more notably posteriorly. Sessile bladder tumor not excluded. No calculi are identified. 2. Several hypodense but above fluid density lesions in the liver are technically nonspecific. These could be benign or malignant/metastatic. Consider hepatic protocol MRI with and without contrast for definitive characterization. The  patient's most recent GFR is only 23 and accordingly MRI with contrast can not be performed at this time, but assuming improvement in renal function after resolution of the obstructive uropathy, MRI should be considered. An alternative would be to perform ultrasound, which might be expected to be less specific. 3. Ill-defined pancreatic margins, much of which may be due to the paucity of intra-adipose tissue. Consider correlating with a lipase level to ensure no pancreatitis. 4.  Prominent stool throughout the colon favors constipation. 5.  Aortic Atherosclerosis (ICD10-I70.0). 6. Levoconvex lumbar scoliosis. 7. Left foraminal impingement at L4-5. Electronically Signed   By: Van Clines M.D.   On: 04/03/2018 15:41   US Renal  Result Date: 04/03/2018 CLINICAL DATA:  Acute renal insufficiency. Known bladder cancer. Patient has an appointment with the nephrologist this morning. EXAM: RENAL / URINARY TRACT ULTRASOUND COMPLETE COMPARISON:  None. FINDINGS: Right Kidney: Renal measurements: 10.5 x 6.1 x 7.4 cm = volume: 248 mL. Severe hydronephrosis. Left Kidney: Renal measurements: 12.5 x 5.5 x 5.1 cm = volume: 184 mL. Severe hydronephrosis Bladder: The posterior wall of the bladder is thickened to 7 or 8 mm. Bilateral ureteral jets are noted. Debris is seen in the bladder and the dilated upper renal collecting systems. The pre void volume is 321 cc. The postvoid volume is 71 cc. IMPRESSION: 1. Severe bilateral hydronephrosis. There is some debris within the dilated collecting systems. 2. The posterior wall of the  bladder is thickened. No other evidence of the reported known bladder cancer. The postvoid volume is 71 cc. There is debris within the urine within the bladder. 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  No records are available for review.  He has a history of bilateral  nephrostomy tubes.  He has severe bilateral hydronephrosis/hydroureter which is most likely chronic.  We do not have a baseline creatinine.  CT shows bladder wall thickening but no obvious tumor.  Recommendation:  1.  Hydronephrosis is most likely chronic.  Prior to any intervention would recommend reviewing his previous records and baseline creatinine before any intervention if this can be expedited.  According to the patient he had bilateral nephrostomy tubes placed after his TURBT leading me to believe that he was unable to be stented.  2.  Request previous urology records for review  Kris Mouton, MD A.M.P. Urology [facility] [address] [place] [address] [place] [address]  Phone: [phone] Fax: [phone]  3.  He will eventually need cystoscopy with bilateral retrograde pyelograms  4.  Follow creatinine and if rising earlier intervention.   04/03/2018, 7:45 PM  John Giovanni,  MD

## 2018-04-03 NOTE — H&P (Addendum)
[facility] NAME: Anthony Hess    MR#:  [id number]  DATE OF BIRTH:  [birth year]  DATE OF ADMISSION:  04/03/2018  PRIMARY CARE PHYSICIAN: Volney American, PA-C   REQUESTING/REFERRING PHYSICIAN: Dr. Gabriela Eves  CHIEF COMPLAINT:  No chief complaint on file.  Bilateral severe hydronephrosis HISTORY OF PRESENT ILLNESS:  Anthony Hess  is a 63 y.o. male with a known history of bladder cancer and hypertension.  The patient is sent from Dr. Keturah Barre office for direct admission due to severe bilateral hydronephrosis.  He has a history of bladder cancer.  He went to Dr. Keturah Barre office due to PCPs referral but is found bilateral severe hydronephrosis per ultrasound.  In addition, he has worsening renal failure.  Dr. [name] requested for direct admission.  The patient denies any symptoms. PAST MEDICAL HISTORY:   Past Medical History:  Diagnosis Date  . Cancer [facility])    Bladder    PAST SURGICAL HISTORY:   Past Surgical History:  Procedure Laterality Date  . TUMOR REMOVAL  2019   Bladder    SOCIAL HISTORY:   Social History   Tobacco Use  . Smoking status: Former Smoker    Packs/day: 0.50    Types: Cigarettes    Last attempt to quit: 03/25/2018    Years since quitting: 0.0  . Smokeless tobacco: Never Used  Substance Use Topics  . Alcohol use: Never    Frequency: Never    FAMILY HISTORY:   Family History  Family history unknown: Yes    DRUG ALLERGIES:  No Known Allergies  REVIEW OF SYSTEMS:   Review of Systems  Constitutional: Negative for chills, fever and malaise/fatigue.  HENT: Negative for sore throat.   Eyes: Negative for blurred vision and double vision.  Respiratory: Negative for cough, hemoptysis, shortness of breath, wheezing and stridor.   Cardiovascular: Negative for chest pain, palpitations, orthopnea and leg swelling.  Gastrointestinal: Negative for abdominal pain, blood in stool, diarrhea,  melena, nausea and vomiting.  Genitourinary: Negative for dysuria, flank pain and hematuria.  Musculoskeletal: Negative for back pain and joint pain.  Skin: Negative for rash.  Neurological: Negative for dizziness, sensory change, focal weakness, seizures, loss of consciousness, weakness and headaches.  Endo/Heme/Allergies: Negative for polydipsia.  Psychiatric/Behavioral: Negative for depression. The patient is not nervous/anxious.     MEDICATIONS AT HOME:   Prior to Admission medications   Medication Sig Start Date End Date Taking? Authorizing Provider  amLODipine (NORVASC) 5 MG tablet Take 1 tablet (5 mg total) by mouth daily. 03/28/18   Volney American, PA-C  amoxicillin-clavulanate (AUGMENTIN) 875-125 MG tablet Take 1 tablet by mouth 2 (two) times daily. 03/31/18   Volney American, PA-C  nicotine (NICODERM CQ - DOSED IN MG/24 HOURS) 21 mg/24hr patch Place 1 patch (21 mg total) onto the skin daily. 03/20/18   Volney American, PA-C      VITAL SIGNS:  Blood pressure (!) 163/102, pulse 69, temperature 97.8 F (36.6 C), temperature source Oral, resp. rate 20, SpO2 99 %.  PHYSICAL EXAMINATION:  Physical Exam  GENERAL:  63 y.o.-year-old patient lying in the bed with no acute distress.  EYES: Pupils equal, round, reactive to light and accommodation. No scleral icterus. Extraocular muscles intact.  HEENT: Head atraumatic, normocephalic. Oropharynx and nasopharynx clear.  NECK:  Supple, no jugular venous distention. No thyroid enlargement, no tenderness.  LUNGS: Normal breath sounds bilaterally, no wheezing, rales,rhonchi or crepitation. No use  of accessory muscles of respiration.  CARDIOVASCULAR: S1, S2 normal. No murmurs, rubs, or gallops.  ABDOMEN: Soft, nontender, nondistended. Bowel sounds present. No organomegaly or mass.  EXTREMITIES: No pedal edema, cyanosis, or clubbing.  NEUROLOGIC: Cranial nerves II through XII are intact. Muscle strength 5/5 in all  extremities. Sensation intact. Gait not checked.  PSYCHIATRIC: The patient is alert and oriented x 3.  SKIN: No obvious rash, lesion, or ulcer.   LABORATORY PANEL:   CBC Recent Labs  Lab 03/27/18 1643  WBC 8.4  HGB 10.7*  HCT 34.3*  PLT 334   ------------------------------------------------------------------------------------------------------------------  Chemistries  Recent Labs  Lab 03/27/18 1643  NA 144  K 5.6*  CL 110*  CO2 17*  GLUCOSE 95  BUN 62*  CREATININE 3.31*  CALCIUM 8.6  AST 20  ALT 21  ALKPHOS 91  BILITOT <0.2   ------------------------------------------------------------------------------------------------------------------  Cardiac Enzymes No results for input(s): TROPONINI in the last 168 hours. ------------------------------------------------------------------------------------------------------------------  RADIOLOGY:  US Renal  Result Date: 04/03/2018 CLINICAL DATA:  Acute renal insufficiency. Known bladder cancer. Patient has an appointment with the nephrologist this morning. EXAM: RENAL / URINARY TRACT ULTRASOUND COMPLETE COMPARISON:  None. FINDINGS: Right Kidney: Renal measurements: 10.5 x 6.1 x 7.4 cm = volume: 248 mL. Severe hydronephrosis. Left Kidney: Renal measurements: 12.5 x 5.5 x 5.1 cm = volume: 184 mL. Severe hydronephrosis Bladder: The posterior wall of the bladder is thickened to 7 or 8 mm. Bilateral ureteral jets are noted. Debris is seen in the bladder and the dilated upper renal collecting systems. The pre void volume is 321 cc. The postvoid volume is 71 cc. IMPRESSION: 1. Severe bilateral hydronephrosis. There is some debris within the dilated collecting systems. 2. The posterior wall of the bladder is thickened. No other evidence of the reported known bladder cancer. The postvoid volume is 71 cc. There is debris within the urine within the bladder. Electronically Signed   By: Dorise Bullion III M.D   On: 04/03/2018 08:39       IMPRESSION AND PLAN:   Bilateral severe hydronephrosis. The patient will be admitted to medical floor. N.p.o. with IV fluid support, follow-up urologist for procedure. Per Dr. Bernardo Heater, CT of abdomen and pelvis without contrast.   May need nephrostomy tube by IR, depending on CT results.  CKD stage IV.  Stable.  History of bladder cancer.  Follow-up urologist consult.  Hypertension.  Continue home hypertension medication.  Tobacco abuse.  Smoking cessation was counseled for 3 to 4 minutes, nicotine patch.  Discussed with Dr. Bernardo Heater. All the records are reviewed and case discussed with ED provider. Management plans discussed with the patient, his wife and they are in agreement.  CODE STATUS: Full code.  TOTAL TIME TAKING CARE OF THIS PATIENT: 32 minutes.    Demetrios Loll M.D on 04/03/2018 at 2:44 PM  Between 7am to 6pm - Pager - [phone]  After 6pm go to [email or web address] - password [email or web address] [facility]  [facility]  Office  [phone]  CC: Primary care physician; Volney American, PA-C   Note: This dictation was prepared with Dragon dictation along with smaller phrase technology. Any transcriptional errors that result from this process are unin

## 2018-04-03 NOTE — Progress Notes (Addendum)
MD notified of vitals after PRN BP med given. Most current BP after hydralazyne given 146/97. HR 66. Diastolic BP still elevated.

## 2018-04-03 NOTE — Progress Notes (Signed)
MD messaged: Do you want to order PRN IV Blood pressure medication. admission BP 163/102. BP rechecked 154/95. He indicates he took his home dose of norvasc at home so dose order for 1600 has held for now.

## 2018-04-03 NOTE — Progress Notes (Signed)
Advanced Care Plan.  Purpose of Encounter: CODE STATUS. Parties in Attendance: The patient, his wife, RN and me. Patient's Decisional Capacity: Yes. Medical Story: Anthony Hess  is a 63 y.o. male with a known history of bladder cancer and hypertension.  The patient is being admitted for bilateral severe hydronephrosis, acute renal failure on CKD and bladder cancer.  I discussed with the patient about his current condition, prognosis and CODE STATUS.  The patient does want to be resuscitated and intubated if he has a cardiopulmonary arrest. Plan:  Code Status: Full code. Time spent discussing advance care planning: 17 minutes.

## 2018-04-04 LAB — HIV ANTIBODY (ROUTINE TESTING W REFLEX): HIV Screen 4th Generation wRfx: NONREACTIVE

## 2018-04-04 LAB — BASIC METABOLIC PANEL
Anion gap: 6 (ref 5–15)
BUN: 54 mg/dL — ABNORMAL HIGH (ref 6–20)
CO2: 18 mmol/L — ABNORMAL LOW (ref 22–32)
Calcium: 7.9 mg/dL — ABNORMAL LOW (ref 8.9–10.3)
Chloride: 118 mmol/L — ABNORMAL HIGH (ref 98–111)
Creatinine, Ser: 2.52 mg/dL — ABNORMAL HIGH (ref 0.61–1.24)
GFR calc Af Amer: 31 mL/min — ABNORMAL LOW (ref >60)
GFR calc non Af Amer: 27 mL/min — ABNORMAL LOW (ref >60)
Glucose, Bld: 88 mg/dL (ref 70–99)
Potassium: 4.3 mmol/L (ref 3.5–5.1)
Sodium: 142 mmol/L (ref 135–145)

## 2018-04-04 LAB — CBC
HCT: 35.5 % — ABNORMAL LOW (ref 39.0–52.0)
Hemoglobin: 10.8 g/dL — ABNORMAL LOW (ref 13.0–17.0)
MCH: 27.1 pg (ref 26.0–34.0)
MCHC: 30.4 g/dL (ref 30.0–36.0)
MCV: 89 fL (ref 80.0–100.0)
Platelets: 300 10*3/uL (ref 150–400)
RBC: 3.99 MIL/uL — ABNORMAL LOW (ref 4.22–5.81)
RDW: 15.4 % (ref 11.5–15.5)
WBC: 8.8 10*3/uL (ref 4.0–10.5)
nRBC: 0 % (ref 0.0–0.2)

## 2018-04-04 MED ORDER — AMLODIPINE BESYLATE 10 MG PO TABS
10.0000 mg | ORAL_TABLET | Freq: Every day | ORAL | Status: DC
  Administered 2018-04-04 – 2018-04-05 (×2): 10 mg via ORAL
  Filled 2018-04-04 (×2): qty 1

## 2018-04-04 NOTE — Progress Notes (Addendum)
[facility] NAME: Anthony Hess    MR#:  [id number]  DATE OF BIRTH:  31-May-1955  SUBJECTIVE:  CHIEF COMPLAINT: Patient sent as a direct admission for evaluation for severe bilateral hydronephrosis  No new complaint this morning.  No fevers.  No nausea vomiting.  No abdominal pains.  Patient seen by urologist.  Awaiting records from [place] before making decision on any intervention.  REVIEW OF SYSTEMS:  Review of Systems  Constitutional: Negative for chills and fever.  HENT: Negative for hearing loss and tinnitus.   Eyes: Negative for blurred vision and double vision.  Respiratory: Negative for cough, hemoptysis and shortness of breath.   Cardiovascular: Negative for chest pain, palpitations and orthopnea.  Gastrointestinal: Negative for abdominal pain, heartburn, nausea and vomiting.  Genitourinary: Negative for dysuria and urgency.  Musculoskeletal: Negative for myalgias and neck pain.  Skin: Negative for itching and rash.  Neurological: Negative for dizziness and headaches.  Psychiatric/Behavioral: Negative for depression and hallucinations.    DRUG ALLERGIES:  No Known Allergies VITALS:  Blood pressure (!) 148/99, pulse 79, temperature 98.6 F (37 C), temperature source Oral, resp. rate 16, height 5\' 4"  (1.626 m), weight 58.7 kg, SpO2 99 %. PHYSICAL EXAMINATION:   Physical Exam  Constitutional: He is oriented to person, place, and time. He appears well-developed and well-nourished.  HENT:  Head: Normocephalic and atraumatic.  Eyes: Pupils are equal, round, and reactive to light. Conjunctivae and EOM are normal.  Neck: Normal range of motion. Neck supple. No thyromegaly present.  Cardiovascular: Normal rate, regular rhythm and normal heart sounds.  Respiratory: Effort normal and breath sounds normal.  GI: Soft. Bowel sounds are normal. There is no abdominal tenderness.  Musculoskeletal: Normal range of motion.       General: No edema.  Neurological: He is alert and oriented to person, place, and time. He has normal reflexes.  Skin: Skin is warm. He is not diaphoretic. No erythema.  Psychiatric: He has a normal mood and affect. His behavior is normal.   LABORATORY PANEL:  Male CBC Recent Labs  Lab 04/04/18 0243  WBC 8.8  HGB 10.8*  HCT 35.5*  PLT 300   ------------------------------------------------------------------------------------------------------------------ Chemistries  Recent Labs  Lab 04/04/18 0243  NA 142  K 4.3  CL 118*  CO2 18*  GLUCOSE 88  BUN 54*  CREATININE 2.52*  CALCIUM 7.9*   RADIOLOGY:  Ct Abdomen Pelvis Wo Contrast  Result Date: 04/03/2018 CLINICAL DATA:  Hydronephrosis.  Reported history of bladder cancer. EXAM: CT ABDOMEN AND PELVIS WITHOUT CONTRAST TECHNIQUE: Multidetector CT imaging of the abdomen and pelvis was performed following the standard protocol without IV contrast. COMPARISON:  04/03/2018 ultrasound FINDINGS: Lower chest: Unremarkable Hepatobiliary: 2.8 by 2.0 cm lesion in the right hepatic lobe, internal density 37 Hounsfield units, image 11/2. 2.2 by 1.9 cm lesion in segment 4 of the liver, internal density 40 Hounsfield units, image 16/2. 0.6 cm hypodense lesion inferiorly in the right hepatic lobe, image 37/2. Questionable 6 mm lesion in the right hepatic lobe, image 22/2. 0.9 cm fluid density lesion in the right hepatic lobe, image 20/2. Slightly contracted gallbladder with dependent densities which could be gallstones or from sludge. Pancreas: Ill-defined pancreatic margins. Some of this may be from paucity of intra-abdominal adipose tissue. Spleen: Unremarkable Adrenals/Urinary Tract: Both adrenal glands appear normal. There is considerable bilateral hydronephrosis with bilateral hydroureter extending down to the urinary bladder without well-defined calculi. Cellules/lobulation along the urinary  bladder noted. I do not see a well-defined tumor put some of  the bladder wall appears mildly thickened including posteriorly. The bladder is currently not distended. Stomach/Bowel: Paucity of intra-adipose tissues and the lack of oral contrast makes separation of adjacent bowel loops problematic. Appendix is visualized and appears normal. Prominent stool throughout the colon favors constipation. Vascular/Lymphatic: Aortoiliac atherosclerotic vascular disease. No definite adenopathy is identified. Reproductive: Unremarkable Other: No supplemental non-categorized findings. Musculoskeletal: Considerable levoconvex lumbar scoliosis. Degenerative subcortical cyst formation or erosions along both acetabular rims superiorly. Grade 1 degenerative anterolisthesis at L4-5 with disc uncovering and facet arthropathy resulting in mild left foraminal impingement at the L4-5 level. 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 IMPRESSION: 1. Prominent bilateral hydronephrosis and hydroureter extending down to the bladder. The urinary bladder has an unusual contour with appearance of peripheral cellules and some bladder wall thickening more notably posteriorly. Sessile bladder tumor not excluded. No calculi are identified. 2. Several hypodense but above fluid density lesions in the liver are technically nonspecific. These could be benign or malignant/metastatic. Consider hepatic protocol MRI with and without contrast for definitive characterization. The patient's most recent GFR is only 23 and accordingly MRI with contrast can not be performed at this time, but assuming improvement in renal function after resolution of the obstructive uropathy, MRI should be considered. An alternative would be to perform ultrasound, which might be expected to be less specific. 3. Ill-defined pancreatic margins, much of which may be due to the paucity of intra-adipose tissue. Consider correlating with a lipase level to ensure no pancreatitis. 4.  Prominent stool throughout the colon favors  constipation. 5.  Aortic Atherosclerosis (ICD10-I70.0). 6. Levoconvex lumbar scoliosis. 7. Left foraminal impingement at L4-5. Electronically Signed   By: Van Clines M.D.   On: 04/03/2018 15:41   ASSESSMENT AND PLAN:   1. Severe bilateral hydronephrosis. Patient seen by urologist already.  Hydronephrosis felt to be likely chronic.  Medical records for patient already requested from [place] before urologist to make any decision regarding any further intervention. Renal function appears to be gradually improving.  No prior creatinine to compare with.  Records already requested. Interventional radiologist note also seen.  Holding off on bilateral nephrostomy tube placement for now until medical records reviewed by urologist.  Nursing staff working on getting records.  To go ahead and resume diet since records still not yet available and no immediate plans for any intervention at this time.  2.  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   Noted gradual improvement in renal function  Follow-up on previous labs when medical records available  3.  History of bladder cancer Follow-up on prior records from [place] when available.  4.  Hypertension; uncontrolled Increased dose of Norvasc from 5 to 10 mg p.o. daily.  PRN IV hydralazine with parameters  5. Tobacco abuse.  Smoking cessation was counseled done already.  Already placed on nicotine patch  6.  Several hypodense lesions in the liver.  Unable to do hepatic MRI protocol with contrast due to kidney disease.  Requested for liver ultrasound for further evaluation.  Also mention of nonspecific findings around the pancreatic margins.  Clinical correlation with lipase level to ensure no pancreatitis recommended.  DVT prophylaxis; heparin  All the records are reviewed and case discussed with Care Management/Social Worker. Management plans discussed with the patient, family and they are in agreement.  CODE STATUS: Full Code  TOTAL TIME TAKING CARE OF  THIS PATIENT: 36 minutes.  More than 50% of the time was spent in counseling/coordination of care: YES  POSSIBLE D/C IN 2 DAYS, DEPENDING ON CLINICAL CONDITION.   Santresa Levett M.D on 04/04/2018 at 12:46 PM  Between 7am to 6pm - Pager - [phone]  After 6pm go to [email or web address] - password [email or web address] [facility]  [employer] [facility] [facility]  Office  [phone]  CC: Primary care physician; Volney American, PA-C  Note: This dictation was prepared with Dragon dictation along with smaller phrase technology. Any transcriptional errors that result from this process are unintentional.

## 2018-04-04 NOTE — Progress Notes (Signed)
[facility]  ROUNDING NOTE   Subjective:   UOP 1975 Creatinine 2.52 (2.86)  NS at 44mL/hr  Objective:  Vital signs in last 24 hours:  Temp:  [97.8 F (36.6 C)-98.6 F (37 C)] 98.6 F (37 C) (02/11 1221) Pulse Rate:  [56-81] 79 (02/11 1221) Resp:  [15-20] 16 (02/11 1221) BP: (135-165)/(91-109) 148/99 (02/11 1221) SpO2:  [97 %-100 %] 99 % (02/11 1221) Weight:  [58.7 kg] 58.7 kg (02/10 1629)  Weight change:  Filed Weights   04/03/18 1629  Weight: 58.7 kg    Intake/Output: I/O last 3 completed shifts: In: 1153.1 [P.O.:390; I.V.:763.1] Out: 1975 [[id number]]   Intake/Output this shift:  Total I/O In: -  Out: 500 [Urine:500]  Physical Exam: General: NAD,   Head: Normocephalic, atraumatic. Moist oral mucosal membranes  Eyes: Anicteric, PERRL  Neck: Supple, trachea midline  Lungs:  Clear to auscultation  Heart: Regular rate and rhythm  Abdomen:  Soft, nontender,   Extremities: no peripheral edema.  Neurologic: Nonfocal, moving all four extremities  Skin: No lesions        Basic Metabolic Panel: Recent Labs  Lab 04/03/18 1458 04/04/18 0243  NA 141 142  K 4.2 4.3  CL 117* 118*  CO2 19* 18*  GLUCOSE 124* 88  BUN 66* 54*  CREATININE 2.86* 2.52*  CALCIUM 8.1* 7.9*    Liver Function Tests: No results for input(s): AST, ALT, ALKPHOS, BILITOT, PROT, ALBUMIN in the last 168 hours. No results for input(s): LIPASE, AMYLASE in the last 168 hours. No results for input(s): AMMONIA in the last 168 hours.  CBC: Recent Labs  Lab 04/03/18 1458 04/04/18 0243  WBC 7.3 8.8  HGB 9.8* 10.8*  HCT 32.1* 35.5*  MCV 87.9 89.0  PLT 284 300    Cardiac Enzymes: No results for input(s): CKTOTAL, CKMB, CKMBINDEX, TROPONINI in the last 168 hours.  BNP: Invalid input(s): POCBNP  CBG: No results for input(s): GLUCAP in the last 168 hours.  Microbiology: Results for orders placed or performed in visit on 03/27/18  Microscopic Examination     Status:  None   Collection Time: 03/27/18  4:30 PM  Result Value Ref Range Status   WBC, UA >30W 0 - 5 /hpf Final   RBC, UA None seen 0 - 2 /hpf Final   Epithelial Cells (non renal) None seen 0 - 10 /hpf Final   Bacteria, UA None seen None seen/Few Final  Urine Culture, Reflex     Status: Abnormal   Collection Time: 03/27/18  4:30 PM  Result Value Ref Range Status   Urine Culture, Routine Final report (A)  Final   Organism ID, Bacteria Klebsiella pneumoniae (A)  Final    Comment: Greater than 100,000 colony forming units per mL Cefazolin <=4 ug/mL Cefazolin with an MIC <=16 predicts susceptibility to the oral agents cefaclor, cefdinir, cefpodoxime, cefprozil, cefuroxime, cephalexin, and loracarbef when used for therapy of uncomplicated urinary tract infections due to E. coli, Klebsiella pneumoniae, and Proteus mirabilis.    Antimicrobial Susceptibility Comment  Final    Comment:       ** S = Susceptible; I = Intermediate; R = Resistant **                    P = Positive; N = Negative             MICS are expressed in micrograms per mL    Antibiotic  RSLT#1    RSLT#2    RSLT#3    RSLT#4 Amoxicillin/Clavulanic Acid    S Ampicillin                     R Cefepime                       S Ceftriaxone                    S Cefuroxime                     S Ciprofloxacin                  S Ertapenem                      S Gentamicin                     S Imipenem                       S Levofloxacin                   S Meropenem                      S Nitrofurantoin                 S Piperacillin/Tazobactam        S Tetracycline                   S Tobramycin                     S Trimethoprim/Sulfa             S     Coagulation Studies: No results for input(s): LABPROT, INR in the last 72 hours.  Urinalysis: Recent Labs    04/03/18 1510  COLORURINE YELLOW*  LABSPEC 1.010  PHURINE 6.0  GLUCOSEU NEGATIVE  HGBUR SMALL*  BILIRUBINUR NEGATIVE  KETONESUR NEGATIVE   PROTEINUR 30*  NITRITE NEGATIVE  LEUKOCYTESUR LARGE*      Imaging: Ct Abdomen Pelvis Wo Contrast  Result Date: 04/03/2018 CLINICAL DATA:  Hydronephrosis.  Reported history of bladder cancer. EXAM: CT ABDOMEN AND PELVIS WITHOUT CONTRAST TECHNIQUE: Multidetector CT imaging of the abdomen and pelvis was performed following the standard protocol without IV contrast. COMPARISON:  04/03/2018 ultrasound FINDINGS: Lower chest: Unremarkable Hepatobiliary: 2.8 by 2.0 cm lesion in the right hepatic lobe, internal density 37 Hounsfield units, image 11/2. 2.2 by 1.9 cm lesion in segment 4 of the liver, internal density 40 Hounsfield units, image 16/2. 0.6 cm hypodense lesion inferiorly in the right hepatic lobe, image 37/2. Questionable 6 mm lesion in the right hepatic lobe, image 22/2. 0.9 cm fluid density lesion in the right hepatic lobe, image 20/2. Slightly contracted gallbladder with dependent densities which could be gallstones or from sludge. Pancreas: Ill-defined pancreatic margins. Some of this may be from paucity of intra-abdominal adipose tissue. Spleen: Unremarkable Adrenals/Urinary Tract: Both adrenal glands appear normal. There is considerable bilateral hydronephrosis with bilateral hydroureter extending down to the urinary bladder without well-defined calculi. Cellules/lobulation along the urinary bladder noted. I do not see a well-defined tumor put some of the bladder wall appears mildly thickened including posteriorly. The bladder is currently not distended. Stomach/Bowel: Paucity of intra-adipose tissues and the lack  of oral contrast makes separation of adjacent bowel loops problematic. Appendix is visualized and appears normal. Prominent stool throughout the colon favors constipation. Vascular/Lymphatic: Aortoiliac atherosclerotic vascular disease. No definite adenopathy is identified. Reproductive: Unremarkable Other: No supplemental non-categorized findings. Musculoskeletal: Considerable  levoconvex lumbar scoliosis. Degenerative subcortical cyst formation or erosions along both acetabular rims superiorly. Grade 1 degenerative anterolisthesis at L4-5 with disc uncovering and facet arthropathy resulting in mild left foraminal impingement at the L4-5 level. 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 IMPRESSION: 1. Prominent bilateral hydronephrosis and hydroureter extending down to the bladder. The urinary bladder has an unusual contour with appearance of peripheral cellules and some bladder wall thickening more notably posteriorly. Sessile bladder tumor not excluded. No calculi are identified. 2. Several hypodense but above fluid density lesions in the liver are technically nonspecific. These could be benign or malignant/metastatic. Consider hepatic protocol MRI with and without contrast for definitive characterization. The patient's most recent GFR is only 23 and accordingly MRI with contrast can not be performed at this time, but assuming improvement in renal function after resolution of the obstructive uropathy, MRI should be considered. An alternative would be to perform ultrasound, which might be expected to be less specific. 3. Ill-defined pancreatic margins, much of which may be due to the paucity of intra-adipose tissue. Consider correlating with a lipase level to ensure no pancreatitis. 4.  Prominent stool throughout the colon favors constipation. 5.  Aortic Atherosclerosis (ICD10-I70.0). 6. Levoconvex lumbar scoliosis. 7. Left foraminal impingement at L4-5. Electronically Signed   By: Van Clines M.D.   On: 04/03/2018 15:41   US Renal  Result Date: 04/03/2018 CLINICAL DATA:  Acute renal insufficiency. Known bladder cancer. Patient has an appointment with the nephrologist this morning. EXAM: RENAL / URINARY TRACT ULTRASOUND COMPLETE COMPARISON:  None. FINDINGS: Right Kidney: Renal measurements: 10.5 x 6.1 x 7.4 cm = volume: 248 mL. Severe hydronephrosis. Left  Kidney: Renal measurements: 12.5 x 5.5 x 5.1 cm = volume: 184 mL. Severe hydronephrosis Bladder: The posterior wall of the bladder is thickened to 7 or 8 mm. Bilateral ureteral jets are noted. Debris is seen in the bladder and the dilated upper renal collecting systems. The pre void volume is 321 cc. The postvoid volume is 71 cc. IMPRESSION: 1. Severe bilateral hydronephrosis. There is some debris within the dilated collecting systems. 2. The posterior wall of the bladder is thickened. No other evidence of the reported known bladder cancer. The postvoid volume is 71 cc. There is debris within the urine within the bladder. Electronically Signed   By: Dorise Bullion III M.D   On: 04/03/2018 08:39     Medications:   . sodium chloride 75 mL/hr at 04/04/18 0737   . amLODipine  10 mg Oral Daily  . heparin  5,000 Units Subcutaneous Q8H  . nicotine  21 mg Transdermal Daily   acetaminophen **OR** acetaminophen, albuterol, bisacodyl, hydrALAZINE, HYDROcodone-acetaminophen, ondansetron **OR** ondansetron (ZOFRAN) IV, senna-docusate  Assessment/ Plan:  [name] is a 63 y.o. white male with hypertension, history of bladder cancer, history of kidney stones in the remote past  1. Acute kidney failure with hyperkalemia and metabolic acidosis on chronic kidney disease NOS. Unknown baseline creatinine. Serologic studies are negative from 03/30/2018 Obstructive uropathy with bilateral hydronephrosis.  Appreciate urology input.  - NS infusion  2. Hypertension: 148/99. Elevated.  - amlodipine.   3. Urinary tract infection: urine culture from 03/29/18 shows klebsiella. Has not received antibiotics.   4.  Secondary Hyperparathyroidism: PTH 182 from outpatient. Calcium low at 7.9. Not currently on vitamin D agent.    LOS: 1 Ezzie Senat 2/11/202012:53 PM

## 2018-04-04 NOTE — Progress Notes (Signed)
  Urology note seen.  Will hold of on bilateral nephrostomy tube placement for now.  Please call IR MD at [phone] if patient does need PCNs placed.  Tobey Schmelzle S Jenesys Casseus PA-C 04/04/2018 8:57 AM

## 2018-04-04 NOTE — Plan of Care (Signed)
Medical records requested from urologist in [place]. No success obtaining the medical records at this time. The patient indicated to day that if he is not able to have any procedure done he would like out patient follow up for procedures. MD has been notified. Voiding. NPO after midnight for US abdomen in the morning since the patient has to be NPO for 8hrs before procedure.  Problem: Education: Goal: Knowledge of General Education information will improve Description Including pain rating scale, medication(s)/side effects and non-pharmacologic comfort measures Outcome: Progressing   Problem: Health Behavior/Discharge Planning: Goal: Ability to manage health-related needs will improve Outcome: Progressing   Problem: Clinical Measurements: Goal: Ability to maintain clinical measurements within normal limits will improve Outcome: Progressing Goal: Will remain free from infection Outcome: Progressing Goal: Diagnostic test results will improve Outcome: Progressing Goal: Respiratory complications will improve Outcome: Progressing Goal: Cardiovascular complication will be avoided Outcome: Progressing   Problem: Activity: Goal: Risk for activity intolerance will decrease Outcome: Progressing   Problem: Nutrition: Goal: Adequate nutrition will be maintained Outcome: Progressing   Problem: Coping: Goal: Level of anxiety will decrease Outcome: Progressing   Problem: Elimination: Goal: Will not experience complications related to bowel motility Outcome: Progressing Goal: Will not experience complications related to urinary retention Outcome: Progressing   Problem: Pain Managment: Goal: General experience of comfort will improve Outcome: Progressing   Problem: Safety: Goal: Ability to remain free from injury will improve Outcome: Progressing   Problem: Skin Integrity: Goal: Risk for impaired skin integrity will decrease Outcome: Progressing

## 2018-04-04 NOTE — Progress Notes (Signed)
Verbal order provided by MD for the patient to be on a heart healthy diet.

## 2018-04-04 NOTE — Progress Notes (Signed)
No complaints this morning.  He remains asymptomatic. Creatinine this morning has decreased to 2.52.  I suspect his hydronephrosis is chronic.  Since his creatinine is decreasing would hold off on any intervention at this time until his records can be reviewed.  John Giovanni, MD

## 2018-04-05 ENCOUNTER — Inpatient Hospital Stay: Payer: Medicaid Other

## 2018-04-05 LAB — BASIC METABOLIC PANEL
Anion gap: 3 — ABNORMAL LOW (ref 5–15)
BUN: 49 mg/dL — ABNORMAL HIGH (ref 6–20)
CO2: 19 mmol/L — ABNORMAL LOW (ref 22–32)
Calcium: 8.1 mg/dL — ABNORMAL LOW (ref 8.9–10.3)
Chloride: 118 mmol/L — ABNORMAL HIGH (ref 98–111)
Creatinine, Ser: 2.75 mg/dL — ABNORMAL HIGH (ref 0.61–1.24)
GFR calc Af Amer: 28 mL/min — ABNORMAL LOW (ref >60)
GFR calc non Af Amer: 24 mL/min — ABNORMAL LOW (ref >60)
Glucose, Bld: 108 mg/dL — ABNORMAL HIGH (ref 70–99)
Potassium: 4.6 mmol/L (ref 3.5–5.1)
Sodium: 140 mmol/L (ref 135–145)

## 2018-04-05 LAB — CBC
HCT: 36.1 % — ABNORMAL LOW (ref 39.0–52.0)
Hemoglobin: 10.8 g/dL — ABNORMAL LOW (ref 13.0–17.0)
MCH: 26.5 pg (ref 26.0–34.0)
MCHC: 29.9 g/dL — ABNORMAL LOW (ref 30.0–36.0)
MCV: 88.5 fL (ref 80.0–100.0)
Platelets: 315 10*3/uL (ref 150–400)
RBC: 4.08 MIL/uL — ABNORMAL LOW (ref 4.22–5.81)
RDW: 15.4 % (ref 11.5–15.5)
WBC: 11.3 10*3/uL — ABNORMAL HIGH (ref 4.0–10.5)
nRBC: 0 % (ref 0.0–0.2)

## 2018-04-05 LAB — PHOSPHORUS: Phosphorus: 3.1 mg/dL (ref 2.5–4.6)

## 2018-04-05 LAB — MAGNESIUM: Magnesium: 2 mg/dL (ref 1.7–2.4)

## 2018-04-05 LAB — LIPASE, BLOOD: Lipase: 42 U/L (ref 11–51)

## 2018-04-05 MED ORDER — CEFTRIAXONE SODIUM 1 G IJ SOLR
1.0000 g | INTRAMUSCULAR | Status: DC
  Filled 2018-04-05 (×2): qty 10

## 2018-04-05 MED ORDER — AMLODIPINE BESYLATE 10 MG PO TABS: 10.0000 mg | ORAL_TABLET | Freq: Every day | ORAL | 0 refills | Status: DC

## 2018-04-05 MED ORDER — SODIUM CHLORIDE 0.9 % IV SOLN
INTRAVENOUS | Status: DC
  Administered 2018-04-05: 09:00:00 via INTRAVENOUS

## 2018-04-05 MED ORDER — SODIUM CHLORIDE 0.9 % IV SOLN
1.0000 g | INTRAVENOUS | Status: DC
  Administered 2018-04-05: 1 g via INTRAVENOUS
  Filled 2018-04-05: qty 1
  Filled 2018-04-05: qty 10

## 2018-04-05 NOTE — Progress Notes (Signed)
Anthony Hess  A and O x 4. VSS. Pt tolerating diet well. No complaints of pain or nausea. IV removed intact, prescriptions given. Pt voiced understanding of discharge instructions with no further questions. Pt discharged via wheelchair.    Allergies as of 04/05/2018   No Known Allergies     Medication List    TAKE these medications   amLODipine 10 MG tablet Commonly known as:  NORVASC Take 1 tablet (10 mg total) by mouth daily. Start taking on:  April 06, 2018 What changed:    medication strength  how much to take   amoxicillin-clavulanate 875-125 MG tablet Commonly known as:  AUGMENTIN Take 1 tablet by mouth 2 (two) times daily.   nicotine 21 mg/24hr patch Commonly known as:  NICODERM CQ - dosed in mg/24 hours Place 1 patch (21 mg total) onto the skin daily.       Vitals:   04/05/18 0921 04/05/18 1202  BP: (!) 145/75 (!) 134/97  Pulse:  76  Resp:    Temp:  98 F (36.7 C)  SpO2:  97%    [name]

## 2018-04-05 NOTE — Progress Notes (Addendum)
Per MD okay for RN to reorder iv fluids. NS at 75.  And place diet order,

## 2018-04-05 NOTE — Discharge Summary (Signed)
[facility] [facility] NAME: Anthony Hess    MR#:  [id number]  DATE OF BIRTH:  04/02/1955  DATE OF ADMISSION:  04/03/2018   ADMITTING PHYSICIAN: Demetrios Loll, MD  DATE OF DISCHARGE: 04/05/2018  PRIMARY CARE PHYSICIAN: Volney American, PA-C   ADMISSION DIAGNOSIS:  Acute renal failure bilateral hydronephrosis DISCHARGE DIAGNOSIS:  Active Problems:   Bilateral hydronephrosis  SECONDARY DIAGNOSIS:   Past Medical History:  Diagnosis Date  . Cancer [facility])    Bladder   HOSPITAL COURSE:  Chief complaint; patient sent as a direct admission due to severe bilateral hydronephrosis  History of presenting complaint; Anthony Hess  is a 63 y.o. male with a known history of bladder cancer , previously managed at [place] and hypertension.  The patient is sent from Dr. Keturah Barre office for direct admission due to severe bilateral hydronephrosis.  He has a history of bladder cancer.  He went to Dr. Keturah Barre office due to PCPs referral but is found bilateral severe hydronephrosis per ultrasound.  In addition, he has worsening renal failure.  Dr. [name] requested for direct admission.  The patient denied any symptoms.   Hospital course; 1. Severe bilateral hydronephrosis. Patient seen by urologist during this admission..  Hydronephrosis felt to be likely chronic.  Medical records was requested from [place].  After multiple attempts, no success with getting medical records from [place].  Patient and wife also confirmed that getting medical records from previous provider in [place] is always been very challenging for them.  I discussed with urologist again this morning Dr. Bernardo Heater who does not plan for any urological intervention at this time.  Felt findings are most likely chronic especially given prior creatinine of 3.31 on 03/27/2018.  Most recent creatinine of 2.75 today.  Patient asking to be discharged today and prefers to follow-up with urologist for  further evaluation and management as outpatient.  Neurologist has given clearance to discharge patient with recommendation to follow-up with primary care physician to repeat BMP early next week.  To follow-up with urologist in 1 week to make arrangements for cystoscopy under general anesthesia with bilateral retrograde pyelogram and possible stent placement within the next 1 to 2 weeks.  2.  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 Noted gradual improvement in renal function  Monitoring by primary care physician and possible outpatient nephrology referral after urological intervention is completed.  3.  History of bladder cancer Previously managed at [place].  To follow-up with urologist locally in the next 1 week.  4.  Hypertension;  Blood pressure better controlled this morning.  Dose of Norvasc was increased from 5 to 10 mg p.o. daily during this admission.  Prescription for 10 mg daily of Norvasc given on discharge.  5. Tobacco abuse. Smoking cessation was counseled done already.  Already placed on nicotine patch  6.  Several hypodense lesions in the liver.  Unable to do hepatic MRI protocol with contrast due to kidney disease. Liver ultrasound was done which revealed at least 2 echogenic lesions in the liver.  Findings suggestive of cavernous hemangioma.  MRI with gadolinium may be considered when renal function adequately improved and appropriate for studies.  DISCHARGE CONDITIONS:  Stable CONSULTS OBTAINED:  Treatment Team:  Abbie Sons, MD Murlean Iba, MD DRUG ALLERGIES:  No Known Allergies DISCHARGE MEDICATIONS:   Allergies as of 04/05/2018   No Known Allergies     Medication List    TAKE these medications  amLODipine 5 MG tablet Commonly known as:  NORVASC Take 1 tablet (5 mg total) by mouth daily.   amoxicillin-clavulanate 875-125 MG tablet Commonly known as:  AUGMENTIN Take 1 tablet by mouth 2 (two) times daily.   nicotine 21 mg/24hr patch Commonly known as:   NICODERM CQ - dosed in mg/24 hours Place 1 patch (21 mg total) onto the skin daily.        DISCHARGE INSTRUCTIONS:   DIET:  Cardiac diet DISCHARGE CONDITION:  Stable ACTIVITY:  Activity as tolerated OXYGEN:  Home Oxygen: No.  Oxygen Delivery: room air DISCHARGE LOCATION:  home   If you experience worsening of your admission symptoms, develop shortness of breath, life threatening emergency, suicidal or homicidal thoughts you must seek medical attention immediately by calling 911 or calling your MD immediately  if symptoms less severe.  You Must read complete instructions/literature along with all the possible adverse reactions/side effects for all the Medicines you take and that have been prescribed to you. Take any new Medicines after you have completely understood and accpet all the possible adverse reactions/side effects.   Please note  You were cared for by a hospitalist during your hospital stay. If you have any questions about your discharge medications or the care you received while you were in the hospital after you are discharged, you can call the unit and asked to speak with the hospitalist on call if the hospitalist that took care of you is not available. Once you are discharged, your primary care physician will handle any further medical issues. Please note that NO REFILLS for any discharge medications will be authorized once you are discharged, as it is imperative that you return to your primary care physician (or establish a relationship with a primary care physician if you do not have one) for your aftercare needs so that they can reassess your need for medications and monitor your lab values.    On the day of Discharge:  VITAL SIGNS:  Blood pressure (!) 134/97, pulse 76, temperature 98 F (36.7 C), temperature source Oral, resp. rate 16, height 5\' 4"  (1.626 m), weight 58.7 kg, SpO2 97 %. PHYSICAL EXAMINATION:  GENERAL:  63 y.o.-year-old patient lying in the bed with  no acute distress.  EYES: Pupils equal, round, reactive to light and accommodation. No scleral icterus. Extraocular muscles intact.  HEENT: Head atraumatic, normocephalic. Oropharynx and nasopharynx clear.  NECK:  Supple, no jugular venous distention. No thyroid enlargement, no tenderness.  LUNGS: Normal breath sounds bilaterally, no wheezing, rales,rhonchi or crepitation. No use of accessory muscles of respiration.  CARDIOVASCULAR: S1, S2 normal. No murmurs, rubs, or gallops.  ABDOMEN: Soft, non-tender, non-distended. Bowel sounds present. No organomegaly or mass.  EXTREMITIES: No pedal edema, cyanosis, or clubbing.  NEUROLOGIC: Cranial nerves II through XII are intact. Muscle strength 5/5 in all extremities. Sensation intact. Gait not checked.  PSYCHIATRIC: The patient is alert and oriented x 3.  SKIN: No obvious rash, lesion, or ulcer.  DATA REVIEW:   CBC Recent Labs  Lab 04/05/18 0437  WBC 11.3*  HGB 10.8*  HCT 36.1*  PLT 315    Chemistries  Recent Labs  Lab 04/05/18 0437  NA 140  K 4.6  CL 118*  CO2 19*  GLUCOSE 108*  BUN 49*  CREATININE 2.75*  CALCIUM 8.1*  MG 2.0     Microbiology Results  Results for orders placed or performed in visit on 03/27/18  Microscopic Examination     Status: None   Collection  Time: 03/27/18  4:30 PM  Result Value Ref Range Status   WBC, UA >30W 0 - 5 /hpf Final   RBC, UA None seen 0 - 2 /hpf Final   Epithelial Cells (non renal) None seen 0 - 10 /hpf Final   Bacteria, UA None seen None seen/Few Final  Urine Culture, Reflex     Status: Abnormal   Collection Time: 03/27/18  4:30 PM  Result Value Ref Range Status   Urine Culture, Routine Final report (A)  Final   Organism ID, Bacteria Klebsiella pneumoniae (A)  Final    Comment: Greater than 100,000 colony forming units per mL Cefazolin <=4 ug/mL Cefazolin with an MIC <=16 predicts susceptibility to the oral agents cefaclor, cefdinir, cefpodoxime, cefprozil, cefuroxime,  cephalexin, and loracarbef when used for therapy of uncomplicated urinary tract infections due to E. coli, Klebsiella pneumoniae, and Proteus mirabilis.    Antimicrobial Susceptibility Comment  Final    Comment:       ** S = Susceptible; I = Intermediate; R = Resistant **                    P = Positive; N = Negative             MICS are expressed in micrograms per mL    Antibiotic                 RSLT#1    RSLT#2    RSLT#3    RSLT#4 Amoxicillin/Clavulanic Acid    S Ampicillin                     R Cefepime                       S Ceftriaxone                    S Cefuroxime                     S Ciprofloxacin                  S Ertapenem                      S Gentamicin                     S Imipenem                       S Levofloxacin                   S Meropenem                      S Nitrofurantoin                 S Piperacillin/Tazobactam        S Tetracycline                   S Tobramycin                     S Trimethoprim/Sulfa             S     RADIOLOGY:  US Abdomen Limited Ruq  Result Date: 04/05/2018 CLINICAL DATA:  63 year old male with history of hepatic lesions noted on prior CT examination. Follow-up study. EXAM: ULTRASOUND ABDOMEN  LIMITED RIGHT UPPER QUADRANT COMPARISON:  None. FINDINGS: Gallbladder: 5 mm echogenic mural nodule associated with the fundus of the gallbladder which is in mobile and nonshadowing, most compatible with a small gallbladder polyp. No gallstones. No sonographic Murphy sign noted by sonographer. Common bile duct: Diameter: 5 mm Liver: At least 2 echogenic lesions are noted in the liver, largest of which measures 2.6 x 2.1 x 2.0 cm in the right lobe, and appears to have some increased through transmission. The other lesion also in the right lobe of the liver measures 2.2 x 1.9 x 1.3 cm, also with apparent increased through transmission. Within normal limits in parenchymal echogenicity. Portal vein is patent on color Doppler imaging with normal  direction of blood flow towards the liver. Other: Severe right-sided hydronephrosis. IMPRESSION: 1. There are at least 2 echogenic lesions in the liver, both of which appear to demonstrate some increased through transmission. These imaging findings are suggestive of cavernous hemangiomas. These could be definitively characterized with abdominal MRI with and without IV gadolinium if clinically appropriate. 2. Severe right-sided hydronephrosis redemonstrated. 3. Small gallbladder wall polyp measuring 5 mm. Electronically Signed   By: Vinnie Langton M.D.   On: 04/05/2018 10:36     Management plans discussed with the patient, family and they are in agreement.  CODE STATUS: Full Code   TOTAL TIME TAKING CARE OF THIS PATIENT: 35 minutes.    Emmitte Surgeon M.D on 04/05/2018 at 1:16 PM  Between 7am to 6pm - Pager - [phone]  After 6pm go to [email or web address] - password [email or web address] [facility]  [facility]  Office  [phone]  CC: Primary care physician; Volney American, PA-C   Note: This dictation was prepared with Dragon dictation along with smaller phrase technology. Any transcriptional errors that result from this process are unintentional.

## 2018-04-05 NOTE — Progress Notes (Signed)
[facility]  ROUNDING NOTE   Subjective:   UOP 2775.   NS at 8mL/hr  Objective:  Vital signs in last 24 hours:  Temp:  [98 F (36.7 C)-98.6 F (37 C)] 98 F (36.7 C) (02/12 1202) Pulse Rate:  [74-93] 76 (02/12 1202) Resp:  [16] 16 (02/12 0424) BP: (131-160)/(75-105) 134/97 (02/12 1202) SpO2:  [97 %-99 %] 97 % (02/12 1202)  Weight change:  Filed Weights   04/03/18 1629  Weight: 58.7 kg    Intake/Output: I/O last 3 completed shifts: In: 2580.7 [P.O.:830; I.V.:1750.7] Out: 4325 [Urine:4325]   Intake/Output this shift:  Total I/O In: -  Out: 200 [Urine:200]  Physical Exam: General: NAD,   Head: Normocephalic, atraumatic. Moist oral mucosal membranes  Eyes: Anicteric, PERRL  Neck: Supple, trachea midline  Lungs:  Clear to auscultation  Heart: Regular rate and rhythm  Abdomen:  Soft, nontender,   Extremities: no peripheral edema.  Neurologic: Nonfocal, moving all four extremities  Skin: No lesions        Basic Metabolic Panel: Recent Labs  Lab 04/03/18 1458 04/04/18 0243 04/05/18 0437  NA 141 142 140  K 4.2 4.3 4.6  CL 117* 118* 118*  CO2 19* 18* 19*  GLUCOSE 124* 88 108*  BUN 66* 54* 49*  CREATININE 2.86* 2.52* 2.75*  CALCIUM 8.1* 7.9* 8.1*  MG  --   --  2.0  PHOS  --   --  3.1    Liver Function Tests: No results for input(s): AST, ALT, ALKPHOS, BILITOT, PROT, ALBUMIN in the last 168 hours. Recent Labs  Lab 04/05/18 0437  LIPASE 42   No results for input(s): AMMONIA in the last 168 hours.  CBC: Recent Labs  Lab 04/03/18 1458 04/04/18 0243 04/05/18 0437  WBC 7.3 8.8 11.3*  HGB 9.8* 10.8* 10.8*  HCT 32.1* 35.5* 36.1*  MCV 87.9 89.0 88.5  PLT 284 300 315    Cardiac Enzymes: No results for input(s): CKTOTAL, CKMB, CKMBINDEX, TROPONINI in the last 168 hours.  BNP: Invalid input(s): POCBNP  CBG: No results for input(s): GLUCAP in the last 168 hours.  Microbiology: Results for orders placed or performed in visit on  03/27/18  Microscopic Examination     Status: None   Collection Time: 03/27/18  4:30 PM  Result Value Ref Range Status   WBC, UA >30W 0 - 5 /hpf Final   RBC, UA None seen 0 - 2 /hpf Final   Epithelial Cells (non renal) None seen 0 - 10 /hpf Final   Bacteria, UA None seen None seen/Few Final  Urine Culture, Reflex     Status: Abnormal   Collection Time: 03/27/18  4:30 PM  Result Value Ref Range Status   Urine Culture, Routine Final report (A)  Final   Organism ID, Bacteria Klebsiella pneumoniae (A)  Final    Comment: Greater than 100,000 colony forming units per mL Cefazolin <=4 ug/mL Cefazolin with an MIC <=16 predicts susceptibility to the oral agents cefaclor, cefdinir, cefpodoxime, cefprozil, cefuroxime, cephalexin, and loracarbef when used for therapy of uncomplicated urinary tract infections due to E. coli, Klebsiella pneumoniae, and Proteus mirabilis.    Antimicrobial Susceptibility Comment  Final    Comment:       ** S = Susceptible; I = Intermediate; R = Resistant **                    P = Positive; N = Negative  MICS are expressed in micrograms per mL    Antibiotic                 RSLT#1    RSLT#2    RSLT#3    RSLT#4 Amoxicillin/Clavulanic Acid    S Ampicillin                     R Cefepime                       S Ceftriaxone                    S Cefuroxime                     S Ciprofloxacin                  S Ertapenem                      S Gentamicin                     S Imipenem                       S Levofloxacin                   S Meropenem                      S Nitrofurantoin                 S Piperacillin/Tazobactam        S Tetracycline                   S Tobramycin                     S Trimethoprim/Sulfa             S     Coagulation Studies: No results for input(s): LABPROT, INR in the last 72 hours.  Urinalysis: Recent Labs    04/03/18 1510  COLORURINE YELLOW*  LABSPEC 1.010  PHURINE 6.0  GLUCOSEU NEGATIVE  HGBUR SMALL*   BILIRUBINUR NEGATIVE  KETONESUR NEGATIVE  PROTEINUR 30*  NITRITE NEGATIVE  LEUKOCYTESUR LARGE*      Imaging: Ct Abdomen Pelvis Wo Contrast  Result Date: 04/03/2018 CLINICAL DATA:  Hydronephrosis.  Reported history of bladder cancer. EXAM: CT ABDOMEN AND PELVIS WITHOUT CONTRAST TECHNIQUE: Multidetector CT imaging of the abdomen and pelvis was performed following the standard protocol without IV contrast. COMPARISON:  04/03/2018 ultrasound FINDINGS: Lower chest: Unremarkable Hepatobiliary: 2.8 by 2.0 cm lesion in the right hepatic lobe, internal density 37 Hounsfield units, image 11/2. 2.2 by 1.9 cm lesion in segment 4 of the liver, internal density 40 Hounsfield units, image 16/2. 0.6 cm hypodense lesion inferiorly in the right hepatic lobe, image 37/2. Questionable 6 mm lesion in the right hepatic lobe, image 22/2. 0.9 cm fluid density lesion in the right hepatic lobe, image 20/2. Slightly contracted gallbladder with dependent densities which could be gallstones or from sludge. Pancreas: Ill-defined pancreatic margins. Some of this may be from paucity of intra-abdominal adipose tissue. Spleen: Unremarkable Adrenals/Urinary Tract: Both adrenal glands appear normal. There is considerable bilateral hydronephrosis with bilateral hydroureter extending down to the urinary bladder without well-defined calculi. Cellules/lobulation along the urinary bladder noted. I do not see a  well-defined tumor put some of the bladder wall appears mildly thickened including posteriorly. The bladder is currently not distended. Stomach/Bowel: Paucity of intra-adipose tissues and the lack of oral contrast makes separation of adjacent bowel loops problematic. Appendix is visualized and appears normal. Prominent stool throughout the colon favors constipation. Vascular/Lymphatic: Aortoiliac atherosclerotic vascular disease. No definite adenopathy is identified. Reproductive: Unremarkable Other: No supplemental non-categorized  findings. Musculoskeletal: Considerable levoconvex lumbar scoliosis. Degenerative subcortical cyst formation or erosions along both acetabular rims superiorly. Grade 1 degenerative anterolisthesis at L4-5 with disc uncovering and facet arthropathy resulting in mild left foraminal impingement at the L4-5 level. 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 IMPRESSION: 1. Prominent bilateral hydronephrosis and hydroureter extending down to the bladder. The urinary bladder has an unusual contour with appearance of peripheral cellules and some bladder wall thickening more notably posteriorly. Sessile bladder tumor not excluded. No calculi are identified. 2. Several hypodense but above fluid density lesions in the liver are technically nonspecific. These could be benign or malignant/metastatic. Consider hepatic protocol MRI with and without contrast for definitive characterization. The patient's most recent GFR is only 23 and accordingly MRI with contrast can not be performed at this time, but assuming improvement in renal function after resolution of the obstructive uropathy, MRI should be considered. An alternative would be to perform ultrasound, which might be expected to be less specific. 3. Ill-defined pancreatic margins, much of which may be due to the paucity of intra-adipose tissue. Consider correlating with a lipase level to ensure no pancreatitis. 4.  Prominent stool throughout the colon favors constipation. 5.  Aortic Atherosclerosis (ICD10-I70.0). 6. Levoconvex lumbar scoliosis. 7. Left foraminal impingement at L4-5. Electronically Signed   By: Van Clines M.D.   On: 04/03/2018 15:41   US Abdomen Limited Ruq  Result Date: 04/05/2018 CLINICAL DATA:  63 year old male with history of hepatic lesions noted on prior CT examination. Follow-up study. EXAM: ULTRASOUND ABDOMEN LIMITED RIGHT UPPER QUADRANT COMPARISON:  None. FINDINGS: Gallbladder: 5 mm echogenic mural nodule associated  with the fundus of the gallbladder which is in mobile and nonshadowing, most compatible with a small gallbladder polyp. No gallstones. No sonographic Murphy sign noted by sonographer. Common bile duct: Diameter: 5 mm Liver: At least 2 echogenic lesions are noted in the liver, largest of which measures 2.6 x 2.1 x 2.0 cm in the right lobe, and appears to have some increased through transmission. The other lesion also in the right lobe of the liver measures 2.2 x 1.9 x 1.3 cm, also with apparent increased through transmission. Within normal limits in parenchymal echogenicity. Portal vein is patent on color Doppler imaging with normal direction of blood flow towards the liver. Other: Severe right-sided hydronephrosis. IMPRESSION: 1. There are at least 2 echogenic lesions in the liver, both of which appear to demonstrate some increased through transmission. These imaging findings are suggestive of cavernous hemangiomas. These could be definitively characterized with abdominal MRI with and without IV gadolinium if clinically appropriate. 2. Severe right-sided hydronephrosis redemonstrated. 3. Small gallbladder wall polyp measuring 5 mm. Electronically Signed   By: Vinnie Langton M.D.   On: 04/05/2018 10:36     Medications:   . cefTRIAXone (ROCEPHIN)  IV 1 g (04/05/18 1112)   . amLODipine  10 mg Oral Daily  . heparin  5,000 Units Subcutaneous Q8H  . nicotine  21 mg Transdermal Daily   acetaminophen **OR** acetaminophen, albuterol, bisacodyl, hydrALAZINE, HYDROcodone-acetaminophen, ondansetron **OR** ondansetron (ZOFRAN) IV, senna-docusate  Assessment/ Plan:  [name] is a 63 y.o. white male with hypertension, history of bladder cancer, history of kidney stones in the remote past  1. Acute kidney failure with hyperkalemia and metabolic acidosis on chronic kidney disease NOS. Unknown baseline creatinine. Serologic studies are negative from 03/30/2018 Obstructive uropathy with bilateral  hydronephrosis.  Appreciate urology input.  - NS infusion as per urology and hospitalist.   2. Hypertension:   - amlodipine.   3. Urinary tract infection: urine culture from 03/29/18 shows klebsiella.  - ceftriaxone  4. Secondary Hyperparathyroidism: PTH 182 from outpatient. Calcium low at 7.9. Not currently on vitamin D agent.    LOS: 2 Nan Maya 2/12/202012:37 PM

## 2018-04-07 ENCOUNTER — Telehealth: Payer: Self-pay

## 2018-04-07 NOTE — Telephone Encounter (Signed)
I have made the 1st attempt to contact the patient or family member in charge, in order to follow up from recently being discharged from the hospital. I left a message on voicemail requesting a CB. -MM 

## 2018-04-10 ENCOUNTER — Inpatient Hospital Stay: Payer: Medicaid Other | Admitting: Family Medicine

## 2018-04-14 ENCOUNTER — Ambulatory Visit: Payer: Medicaid Other | Admitting: Family Medicine

## 2018-04-14 ENCOUNTER — Telehealth: Payer: Self-pay | Admitting: Family Medicine

## 2018-04-14 ENCOUNTER — Other Ambulatory Visit: Payer: Self-pay

## 2018-04-14 ENCOUNTER — Encounter: Payer: Self-pay | Admitting: Family Medicine

## 2018-04-14 VITALS — BP 152/90 | HR 72 | Temp 97.5°F | Wt 138.4 lb

## 2018-04-14 DIAGNOSIS — R799 Abnormal finding of blood chemistry, unspecified: Secondary | ICD-10-CM

## 2018-04-14 DIAGNOSIS — Z8551 Personal history of malignant neoplasm of bladder: Secondary | ICD-10-CM

## 2018-04-14 DIAGNOSIS — R7989 Other specified abnormal findings of blood chemistry: Secondary | ICD-10-CM

## 2018-04-14 DIAGNOSIS — N1330 Unspecified hydronephrosis: Secondary | ICD-10-CM

## 2018-04-14 DIAGNOSIS — J439 Emphysema, unspecified: Secondary | ICD-10-CM | POA: Insufficient documentation

## 2018-04-14 DIAGNOSIS — J438 Other emphysema: Secondary | ICD-10-CM | POA: Insufficient documentation

## 2018-04-14 MED ORDER — AMLODIPINE BESYLATE 10 MG PO TABS: 10.0000 mg | ORAL_TABLET | Freq: Every day | ORAL | 0 refills | Status: DC

## 2018-04-14 MED ORDER — METOPROLOL SUCCINATE ER 25 MG PO TB24: 25.0000 mg | ORAL_TABLET | Freq: Every day | ORAL | 0 refills | Status: DC

## 2018-04-14 NOTE — Telephone Encounter (Signed)
Looks like he's scheduled to come in today - please let him know we will check at that visit  Copied from [place] [id number]. Topic: General - Other >> Apr 13, 2018  1:43 PM Yvette Rack wrote: Reason for CRM: pt calling stating that he would like to come in to get his creatine level checked please call pt at [phone]

## 2018-04-14 NOTE — Telephone Encounter (Signed)
Called patient, no answer. Left detailed message (DPR reviewed) and asked pt to return the call with any questions about the message.

## 2018-04-14 NOTE — Progress Notes (Signed)
BP (!) 152/90   Pulse 72   Temp (!) 97.5 F (36.4 C) (Oral)   Wt 138 lb 6.4 oz (62.8 kg)   SpO2 98%   BMI 23.76 kg/m    Subjective:    Patient ID: Anthony Hess, male    DOB: Jun 21, 1955, 63 y.o.   MRN: [id number]  HPI: Anthony Hess is a 63 y.o. male  Chief Complaint  Patient presents with  . Hospitalization Follow-up    at [facility] for 2 days, has appt with urology on Monday 04/17/18   Here today for hospital f/u for b/l hydronephrosis and renal insufficiency. Was direct admitted by Nephrologist after renal u/s indicated severe b/l hydronephrosis for further testing. Creatinine has slowly improved, at 2.75 at time of discharge. Has outpt Urology f/u for further management and possible stenting next week. Remains asymptomatic.   Relevant past medical, surgical, family and social history reviewed and updated as indicated. Interim medical history since our last visit reviewed. Allergies and medications reviewed and updated.  Review of Systems  Per HPI unless specifically indicated above     Objective:    BP (!) 152/90   Pulse 72   Temp (!) 97.5 F (36.4 C) (Oral)   Wt 138 lb 6.4 oz (62.8 kg)   SpO2 98%   BMI 23.76 kg/m   Wt Readings from Last 3 Encounters:  04/14/18 138 lb 6.4 oz (62.8 kg)  04/03/18 129 lb 6.6 oz (58.7 kg)  03/27/18 131 lb 8 oz (59.6 kg)    Physical Exam Vitals signs and nursing note reviewed.  Constitutional:      Appearance: Normal appearance.  HENT:     Head: Atraumatic.  Eyes:     Extraocular Movements: Extraocular movements intact.     Conjunctiva/sclera: Conjunctivae normal.  Neck:     Musculoskeletal: Normal range of motion and neck supple.  Cardiovascular:     Rate and Rhythm: Normal rate and regular rhythm.  Pulmonary:     Effort: Pulmonary effort is normal.     Breath sounds: Normal breath sounds.  Musculoskeletal: Normal range of motion.  Skin:    General: Skin is warm and dry.  Neurological:     General: No focal deficit  present.     Mental Status: He is oriented to person, place, and time.  Psychiatric:        Mood and Affect: Mood normal.        Thought Content: Thought content normal.        Judgment: Judgment normal.     Results for orders placed or performed in visit on 29/79/89  Basic Metabolic Panel (BMET)  Result Value Ref Range   Glucose 90 65 - 99 mg/dL   BUN 56 (H) 8 - 27 mg/dL   Creatinine, Ser 2.45 (H) 0.76 - 1.27 mg/dL   GFR calc non Af Amer 28 (L) >59 mL/min/1.73   GFR calc Af Amer 32 (L) >59 mL/min/1.73   BUN/Creatinine Ratio 23 10 - 24   Sodium 145 (H) 134 - 144 mmol/L   Potassium 5.2 3.5 - 5.2 mmol/L   Chloride 112 (H) 96 - 106 mmol/L   CO2 18 (L) 20 - 29 mmol/L   Calcium 7.9 (L) 8.6 - 10.2 mg/dL      Assessment & Plan:   Problem List Items Addressed This Visit      Genitourinary   Bilateral hydronephrosis - Primary    Await Urology follow up early next week. Repeat bmp today  Relevant Orders   Basic Metabolic Panel (BMET) (Completed)     Other   History of bladder cancer    Other Visit Diagnoses    Abnormal serum creatinine level       Relevant Orders   Basic Metabolic Panel (BMET) (Completed)       Follow up plan: Return in about 4 weeks (around 05/12/2018) for BP.

## 2018-04-15 LAB — BASIC METABOLIC PANEL
BUN/Creatinine Ratio: 23 (ref 10–24)
BUN: 56 mg/dL — ABNORMAL HIGH (ref 8–27)
CO2: 18 mmol/L — ABNORMAL LOW (ref 20–29)
Calcium: 7.9 mg/dL — ABNORMAL LOW (ref 8.6–10.2)
Chloride: 112 mmol/L — ABNORMAL HIGH (ref 96–106)
Creatinine, Ser: 2.45 mg/dL — ABNORMAL HIGH (ref 0.76–1.27)
GFR calc Af Amer: 32 mL/min/{1.73_m2} — ABNORMAL LOW (ref >59)
GFR calc non Af Amer: 28 mL/min/{1.73_m2} — ABNORMAL LOW (ref >59)
Glucose: 90 mg/dL (ref 65–99)
Potassium: 5.2 mmol/L (ref 3.5–5.2)
Sodium: 145 mmol/L — ABNORMAL HIGH (ref 134–144)

## 2018-04-15 NOTE — Assessment & Plan Note (Signed)
Await Urology follow up early next week. Repeat bmp today

## 2018-04-17 ENCOUNTER — Other Ambulatory Visit: Payer: Medicaid Other | Admitting: Urology

## 2018-04-18 ENCOUNTER — Ambulatory Visit: Payer: Medicaid Other | Admitting: Urology

## 2018-04-25 ENCOUNTER — Telehealth: Payer: Self-pay | Admitting: Family Medicine

## 2018-04-25 NOTE — Telephone Encounter (Signed)
Copied from [place] [phone] [id number]. Topic: General - Other >> Apr 25, 2018  8:28 AM Keene Breath wrote: Reason for CRM: Patient called to ask that the doctor fill out some temporary disability forms.  Please advise and call patient back at [phone]

## 2018-04-25 NOTE — Telephone Encounter (Signed)
Patient called and scheduled for OV to discuss Short term disability.

## 2018-04-26 ENCOUNTER — Encounter: Payer: Self-pay | Admitting: Family Medicine

## 2018-04-26 ENCOUNTER — Ambulatory Visit: Payer: Medicaid Other | Admitting: Family Medicine

## 2018-04-26 ENCOUNTER — Other Ambulatory Visit: Payer: Self-pay

## 2018-04-26 VITALS — BP 115/71 | HR 61 | Temp 97.3°F | Ht 63.6 in | Wt 131.0 lb

## 2018-04-26 DIAGNOSIS — N1330 Unspecified hydronephrosis: Secondary | ICD-10-CM

## 2018-04-26 NOTE — Progress Notes (Signed)
BP 115/71   Pulse 61   Temp (!) 97.3 F (36.3 C) (Oral)   Ht 5' 3.6" (1.615 m)   Wt 131 lb (59.4 kg)   SpO2 97%   BMI 22.77 kg/m    Subjective:    Patient ID: Anthony Hess, male    DOB: 03/30/1955, 63 y.o.   MRN: [id number]  HPI: Anthony Hess is a 63 y.o. male  Chief Complaint  Patient presents with  . Paperwork    short term disability   Here today requesting paperwork to excuse him from work for doctor appointments. States he has been unable to miss days in order to go to his specialist appointments for his severe b/l nephrosis and renal issues the past few weeks as strongly recommended. No new sxs, has been tracking his BP and states it's been normal when checked at home. Has been trying to make dietary changes to reduce sodium intake as well. Denies CP, SOB, new urinary sxs.   Relevant past medical, surgical, family and social history reviewed and updated as indicated. Interim medical history since our last visit reviewed. Allergies and medications reviewed and updated.  Review of Systems  Per HPI unless specifically indicated above     Objective:    BP 115/71   Pulse 61   Temp (!) 97.3 F (36.3 C) (Oral)   Ht 5' 3.6" (1.615 m)   Wt 131 lb (59.4 kg)   SpO2 97%   BMI 22.77 kg/m   Wt Readings from Last 3 Encounters:  04/26/18 131 lb (59.4 kg)  04/14/18 138 lb 6.4 oz (62.8 kg)  04/03/18 129 lb 6.6 oz (58.7 kg)    Physical Exam Vitals signs and nursing note reviewed.  Constitutional:      Appearance: Normal appearance.  HENT:     Head: Atraumatic.  Eyes:     Extraocular Movements: Extraocular movements intact.     Conjunctiva/sclera: Conjunctivae normal.  Neck:     Musculoskeletal: Normal range of motion and neck supple.  Cardiovascular:     Rate and Rhythm: Normal rate and regular rhythm.  Pulmonary:     Effort: Pulmonary effort is normal.     Breath sounds: Normal breath sounds.  Musculoskeletal: Normal range of motion.  Skin:    General: Skin is  warm and dry.  Neurological:     General: No focal deficit present.     Mental Status: He is oriented to person, place, and time.  Psychiatric:        Mood and Affect: Mood normal.        Thought Content: Thought content normal.        Judgment: Judgment normal.     Results for orders placed or performed in visit on 08/15/74  Basic Metabolic Panel (BMET)  Result Value Ref Range   Glucose 93 65 - 99 mg/dL   BUN 52 (H) 8 - 27 mg/dL   Creatinine, Ser 2.95 (H) 0.76 - 1.27 mg/dL   GFR calc non Af Amer 22 (L) >59 mL/min/1.73   GFR calc Af Amer 25 (L) >59 mL/min/1.73   BUN/Creatinine Ratio 18 10 - 24   Sodium 142 134 - 144 mmol/L   Potassium 5.4 (H) 3.5 - 5.2 mmol/L   Chloride 117 (HH) 96 - 106 mmol/L   CO2 12 (L) 20 - 29 mmol/L   Calcium 7.8 (L) 8.6 - 10.2 mg/dL      Assessment & Plan:   Problem List Items Addressed This Visit  Genitourinary   Bilateral hydronephrosis - Primary    Reiterated imperative nature of keeping his specialist appts to address his severe hydronephrosis and renal injury. He states he will get back in with them this week. Will recheck BMP and await specialist notes. Recommended he meet with HR at his job and generate paperwork to cover for his appts.       Relevant Orders   Basic Metabolic Panel (BMET) (Completed)       Follow up plan: Return for as scheduled.

## 2018-04-27 ENCOUNTER — Ambulatory Visit: Payer: Medicaid Other | Admitting: Family Medicine

## 2018-04-27 LAB — BASIC METABOLIC PANEL
BUN/Creatinine Ratio: 18 (ref 10–24)
BUN: 52 mg/dL — ABNORMAL HIGH (ref 8–27)
CO2: 12 mmol/L — ABNORMAL LOW (ref 20–29)
Calcium: 7.8 mg/dL — ABNORMAL LOW (ref 8.6–10.2)
Chloride: 117 mmol/L (ref 96–106)
Creatinine, Ser: 2.95 mg/dL — ABNORMAL HIGH (ref 0.76–1.27)
GFR calc Af Amer: 25 mL/min/{1.73_m2} — ABNORMAL LOW (ref >59)
GFR calc non Af Amer: 22 mL/min/{1.73_m2} — ABNORMAL LOW (ref >59)
Glucose: 93 mg/dL (ref 65–99)
Potassium: 5.4 mmol/L — ABNORMAL HIGH (ref 3.5–5.2)
Sodium: 142 mmol/L (ref 134–144)

## 2018-04-28 ENCOUNTER — Telehealth: Payer: Self-pay | Admitting: Family Medicine

## 2018-04-28 NOTE — Telephone Encounter (Signed)
Relayed lab results to patient. Please see lab results for message. He stated he would call to schedule OV w/ urology.

## 2018-04-28 NOTE — Telephone Encounter (Signed)
Copied from [place] [id number]. Topic: Quick Communication - See Telephone Encounter >> Apr 28, 2018  8:54 AM Anthony Hess wrote: CRM for notification. See Telephone encounter for: 04/28/18.  Patient is calling back for lab results-Please advise. Thank you. [phone]

## 2018-04-28 NOTE — Telephone Encounter (Signed)
Patient states he did not talk to anyone and get his lab results and would like Jada to call him

## 2018-04-28 NOTE — Telephone Encounter (Signed)
Please give him a call regarding the lab message - see previous telephone encounter

## 2018-04-29 NOTE — Assessment & Plan Note (Signed)
Reiterated imperative nature of keeping his specialist appts to address his severe hydronephrosis and renal injury. He states he will get back in with them this week. Will recheck BMP and await specialist notes. Recommended he meet with HR at his job and generate paperwork to cover for his appts.

## 2018-05-01 NOTE — Telephone Encounter (Signed)
Message relayed to patient. Verbalized understanding and denied questions. Pt stated he would schedule OV immediately. Reiterated that pt had no further questions and he again denied questions.    'Please let him know that his labs are worsening again significantly and it is imperative that he go see the Urologist immediately as discussed at multiple visits now.'

## 2018-05-04 ENCOUNTER — Other Ambulatory Visit: Payer: Self-pay

## 2018-05-22 ENCOUNTER — Ambulatory Visit: Payer: Medicaid Other | Admitting: Family Medicine

## 2018-05-25 ENCOUNTER — Telehealth: Payer: Self-pay | Admitting: Family Medicine

## 2018-05-25 MED ORDER — AMLODIPINE BESYLATE 10 MG PO TABS: 10.0000 mg | ORAL_TABLET | Freq: Every day | ORAL | 0 refills | Status: DC

## 2018-05-25 MED ORDER — METOPROLOL SUCCINATE ER 25 MG PO TB24: 25.0000 mg | ORAL_TABLET | Freq: Every day | ORAL | 0 refills | Status: DC

## 2018-05-25 NOTE — Telephone Encounter (Signed)
Copied from [place] [phone]. Topic: Quick Communication - Rx Refill/Question >> May 25, 2018 11:00 AM Rayann Heman wrote: Medication: metoprolol succinate (TOPROL-XL) 25 MG 24 hr tablet [[id number]] amLODipine (NORVASC) 10 MG tablet [[id number]]  Has the patient contacted their pharmacy? no Preferred Pharmacy (with phone number or street name):[facility] [place] [phone] (Phone) [phone] (Fax)   Agent: Please be advised that RX refills may take up to 3 business days. We ask that you follow-up with your pharmacy.

## 2018-05-25 NOTE — Telephone Encounter (Signed)
Refills sent

## 2018-06-06 ENCOUNTER — Encounter: Payer: Self-pay | Admitting: Urology

## 2018-06-06 NOTE — Progress Notes (Unsigned)
Patient seen as an inpatient consultation April 04, 2018 with a prior history of bladder cancer and his records were not available for review at the time of his hospitalization.  They have subsequently been received.  He was scheduled for a follow-up appointment last month however canceled. Chart review as follows:   -Hospitalized 02/06/2017-02/11/2017 to [facility] in [place] for hematuria and acute kidney injury with creatinine 6.9 and CT showing severe bilateral hydronephrosis and hyperdense material in the bladder with an antecedent 1 year history of intermittent gross hematuria and voiding difficulty.  -Initial placement bilateral percutaneous nephrostomy tubes 02/07/2017  -Cystoscopy with clot evacuation; TURBT performed 02/09/2017  -Intraoperative findings extensive papillary tumor estimated >5 cm occupying the entire trigone and obscuring both ureteral orifice ease.  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  Estimated approximately 90% of the tumor resected.  -Pathology low-grade urothelial carcinoma; equivocal subepithelial invasion; no muscle invasion noted  -Nephrostomy tubes converted to bilateral ureteral stents 04/06/2017  -Cystoscopy under anesthesia 04/25/2017; 4 cm papillary tumor posterior wall of the bladder entirely resected  -Pathology Ta urothelial carcinoma, low-grade; muscle present and not involved  -Ureteral stents removed 05/02/2017  -6-week course of intravesical BCG recommended however patient did not follow-up stating he moved to [place] and did not establish urologic care in the area.  -Last creatinine I can find in record review was January 2019 at 2.67

## 2018-06-07 ENCOUNTER — Telehealth: Payer: Self-pay | Admitting: Urology

## 2018-06-07 NOTE — Telephone Encounter (Signed)
LMOM FOR PT TO CALL OFFICE TO SCHEDULE FOLLOW UP APPT W/STOIOFF

## 2018-06-09 NOTE — Progress Notes (Unsigned)
I called and left a message for the patient to call the office back to reschd his missed app with Dr. Bernardo Heater for his hospital follow up.  Sharyn Lull

## 2018-06-16 ENCOUNTER — Telehealth: Payer: Self-pay | Admitting: Family Medicine

## 2018-06-16 NOTE — Telephone Encounter (Signed)
Called pt to set up virtual appt for Monday no answer left vm

## 2018-06-19 ENCOUNTER — Ambulatory Visit: Payer: Medicaid Other | Admitting: Family Medicine

## 2018-06-19 ENCOUNTER — Other Ambulatory Visit: Payer: Self-pay

## 2018-08-16 ENCOUNTER — Telehealth: Payer: Self-pay | Admitting: Family Medicine

## 2018-08-16 NOTE — Telephone Encounter (Signed)
Needs appt. Once appt made will bridge until then

## 2018-08-16 NOTE — Telephone Encounter (Signed)
Medication Refill - Medication:   amLODipine (NORVASC) 10 MG tablet    metoprolol succinate (TOPROL-XL) 25 MG 24 hr tablet     Preferred Pharmacy:  [facility] (N), [place] - [address] [phone] (Phone) [phone] (Fax     Agent: Please be advised that RX refills may take up to 3 business days. We ask that you follow-up with your pharmacy.

## 2018-08-17 MED ORDER — AMLODIPINE BESYLATE 10 MG PO TABS: 10.0000 mg | ORAL_TABLET | Freq: Every day | ORAL | 0 refills | Status: DC

## 2018-08-17 MED ORDER — METOPROLOL SUCCINATE ER 25 MG PO TB24: 25.0000 mg | ORAL_TABLET | Freq: Every day | ORAL | 0 refills | Status: DC

## 2018-08-17 NOTE — Telephone Encounter (Signed)
Pt scheduled for 2 weeks.

## 2018-08-17 NOTE — Telephone Encounter (Signed)
Left message on machine for pt to return call to the office.  

## 2018-08-17 NOTE — Telephone Encounter (Signed)
1 month rx's sent to bridge until appt

## 2018-08-31 ENCOUNTER — Other Ambulatory Visit: Payer: Self-pay

## 2018-08-31 ENCOUNTER — Ambulatory Visit: Payer: Medicaid Other | Admitting: Family Medicine

## 2018-08-31 ENCOUNTER — Encounter: Payer: Self-pay | Admitting: Family Medicine

## 2018-08-31 VITALS — BP 135/87 | HR 66 | Temp 98.5°F | Ht 63.6 in | Wt 127.0 lb

## 2018-08-31 DIAGNOSIS — Z8551 Personal history of malignant neoplasm of bladder: Secondary | ICD-10-CM

## 2018-08-31 DIAGNOSIS — N1330 Unspecified hydronephrosis: Secondary | ICD-10-CM

## 2018-08-31 DIAGNOSIS — I10 Essential (primary) hypertension: Secondary | ICD-10-CM

## 2018-08-31 DIAGNOSIS — R8290 Unspecified abnormal findings in urine: Secondary | ICD-10-CM

## 2018-08-31 NOTE — Patient Instructions (Signed)
Dr. Elenor Quinones the Urologist  [phone]

## 2018-08-31 NOTE — Progress Notes (Signed)
BP 135/87   Pulse 66   Temp 98.5 F (36.9 C) (Oral)   Ht 5' 3.6" (1.615 m)   Wt 127 lb (57.6 kg)   SpO2 97%   BMI 22.07 kg/m    Subjective:    Patient ID: Anthony Hess, male    DOB: 24-Jan-1956, 63 y.o.   MRN: [id number]  HPI: Anthony Hess is a 63 y.o. male  Chief Complaint  Patient presents with  . Hypertension   Here today for HTN f/u. BPs have been around 130s/80s when checked at home other than last week for a few days when he ran out of medicine. Otherwise takes faithfully. No side effects.   Hx of bladder cancer and b/l hydronephrosis with acute renal failure episode last year. Was supposed to establish with outpatient Urology several different times over the past year and has not done so yet. States he will call them today or tomorrow and get set up with an appointment. Having cloudy urine but otherwise feeling "ok".   Relevant past medical, surgical, family and social history reviewed and updated as indicated. Interim medical history since our last visit reviewed. Allergies and medications reviewed and updated.  Review of Systems  Per HPI unless specifically indicated above     Objective:    BP 135/87   Pulse 66   Temp 98.5 F (36.9 C) (Oral)   Ht 5' 3.6" (1.615 m)   Wt 127 lb (57.6 kg)   SpO2 97%   BMI 22.07 kg/m   Wt Readings from Last 3 Encounters:  08/31/18 127 lb (57.6 kg)  04/26/18 131 lb (59.4 kg)  04/14/18 138 lb 6.4 oz (62.8 kg)    Physical Exam Vitals signs and nursing note reviewed.  Constitutional:      Appearance: Normal appearance.  HENT:     Head: Atraumatic.  Eyes:     Extraocular Movements: Extraocular movements intact.     Conjunctiva/sclera: Conjunctivae normal.  Neck:     Musculoskeletal: Normal range of motion and neck supple.  Cardiovascular:     Rate and Rhythm: Normal rate and regular rhythm.  Pulmonary:     Effort: Pulmonary effort is normal.     Breath sounds: Normal breath sounds.  Musculoskeletal: Normal range of  motion.  Skin:    General: Skin is warm and dry.  Neurological:     General: No focal deficit present.     Mental Status: He is oriented to person, place, and time.  Psychiatric:        Mood and Affect: Mood normal.        Thought Content: Thought content normal.        Judgment: Judgment normal.     Results for orders placed or performed in visit on 08/31/18  Microscopic Examination   URINE  Result Value Ref Range   WBC, UA >30 (A) 0 - 5 /hpf   RBC 3-10 (A) 0 - 2 /hpf   Epithelial Cells (non renal) 0-10 0 - 10 /hpf   Bacteria, UA Many (A) None seen/Few  Urine Culture, Reflex   URINE  Result Value Ref Range   Urine Culture, Routine Preliminary report (A)    Organism ID, Bacteria Enterobacter cloacae (A)   UA/M w/rflx Culture, Routine   Specimen: Urine   URINE  Result Value Ref Range   Specific Gravity, UA 1.015 1.005 - 1.030   pH, UA 6.5 5.0 - 7.5   Color, UA Yellow Yellow   Appearance Ur Cloudy (A)  Clear   Leukocytes,UA 3+ (A) Negative   Protein,UA 2+ (A) Negative/Trace   Glucose, UA Negative Negative   Ketones, UA Negative Negative   RBC, UA 2+ (A) Negative   Bilirubin, UA Negative Negative   Urobilinogen, Ur 0.2 0.2 - 1.0 mg/dL   Nitrite, UA Positive (A) Negative   Microscopic Examination See below:    Urinalysis Reflex Comment   Basic metabolic panel  Result Value Ref Range   Glucose 88 65 - 99 mg/dL   BUN 44 (H) 8 - 27 mg/dL   Creatinine, Ser 2.79 (H) 0.76 - 1.27 mg/dL   GFR calc non Af Amer 24 (L) >59 mL/min/1.73   GFR calc Af Amer 27 (L) >59 mL/min/1.73   BUN/Creatinine Ratio 16 10 - 24   Sodium 142 134 - 144 mmol/L   Potassium 5.1 3.5 - 5.2 mmol/L   Chloride 111 (H) 96 - 106 mmol/L   CO2 18 (L) 20 - 29 mmol/L   Calcium 8.4 (L) 8.6 - 10.2 mg/dL      Assessment & Plan:   Problem List Items Addressed This Visit      Cardiovascular and Mediastinum   Essential hypertension - Primary    BPs stable and WNL, continue current regimen      Relevant  Orders   UA/M w/rflx Culture, Routine (Completed)   Basic metabolic panel (Completed)     Genitourinary   Bilateral hydronephrosis     Other   History of bladder cancer    Other Visit Diagnoses    Cloudy urine       Await U/A, tx as needed. Strongly urged patient to follow up with Urology as recommended for multiple issues both acute and chronic   Relevant Orders   UA/M w/rflx Culture, Routine (Completed)       Follow up plan: Return in about 6 months (around 03/03/2019) for 6 month f/u.

## 2018-09-01 LAB — BASIC METABOLIC PANEL
BUN/Creatinine Ratio: 16 (ref 10–24)
BUN: 44 mg/dL — ABNORMAL HIGH (ref 8–27)
CO2: 18 mmol/L — ABNORMAL LOW (ref 20–29)
Calcium: 8.4 mg/dL — ABNORMAL LOW (ref 8.6–10.2)
Chloride: 111 mmol/L — ABNORMAL HIGH (ref 96–106)
Creatinine, Ser: 2.79 mg/dL — ABNORMAL HIGH (ref 0.76–1.27)
GFR calc Af Amer: 27 mL/min/{1.73_m2} — ABNORMAL LOW (ref >59)
GFR calc non Af Amer: 24 mL/min/{1.73_m2} — ABNORMAL LOW (ref >59)
Glucose: 88 mg/dL (ref 65–99)
Potassium: 5.1 mmol/L (ref 3.5–5.2)
Sodium: 142 mmol/L (ref 134–144)

## 2018-09-03 NOTE — Assessment & Plan Note (Signed)
BPs stable and WNL, continue current regimen 

## 2018-09-04 ENCOUNTER — Other Ambulatory Visit: Payer: Self-pay | Admitting: Family Medicine

## 2018-09-04 LAB — MICROSCOPIC EXAMINATION: WBC, UA: >30 /hpf — AB (ref 0–5)

## 2018-09-04 LAB — UA/M W/RFLX CULTURE, ROUTINE
Bilirubin, UA: NEGATIVE
Glucose, UA: NEGATIVE
Ketones, UA: NEGATIVE
Nitrite, UA: POSITIVE — AB
Specific Gravity, UA: 1.015 (ref 1.005–1.030)
Urobilinogen, Ur: 0.2 mg/dL (ref 0.2–1.0)
pH, UA: 6.5 (ref 5.0–7.5)

## 2018-09-04 LAB — URINE CULTURE, REFLEX

## 2018-09-04 MED ORDER — SULFAMETHOXAZOLE-TRIMETHOPRIM 800-160 MG PO TABS: 1.0000 | ORAL_TABLET | Freq: Two times a day (BID) | ORAL | 0 refills | Status: DC

## 2018-09-13 ENCOUNTER — Telehealth: Payer: Self-pay | Admitting: Family Medicine

## 2018-09-13 NOTE — Telephone Encounter (Signed)
Pt called in to speak with Tiffany, pt says that he is returning her call. Pt would like a call back at spouse line  CB: [phone]- Anthony Hess

## 2018-09-13 NOTE — Telephone Encounter (Signed)
Called Anthony Hess back to set up 6 month fu no answer left vm

## 2018-10-12 ENCOUNTER — Other Ambulatory Visit: Payer: Medicaid Other | Admitting: Urology

## 2018-10-19 ENCOUNTER — Other Ambulatory Visit: Payer: Self-pay

## 2018-10-19 ENCOUNTER — Encounter: Payer: Self-pay | Admitting: Urology

## 2018-10-19 ENCOUNTER — Ambulatory Visit (INDEPENDENT_AMBULATORY_CARE_PROVIDER_SITE_OTHER): Payer: Medicaid Other | Admitting: Urology

## 2018-10-19 VITALS — BP 149/84 | HR 56 | Ht 65.0 in | Wt 128.5 lb

## 2018-10-19 DIAGNOSIS — Z8551 Personal history of malignant neoplasm of bladder: Secondary | ICD-10-CM

## 2018-10-19 LAB — MICROSCOPIC EXAMINATION: WBC, UA: >30 /hpf — AB (ref 0–5)

## 2018-10-19 LAB — URINALYSIS, COMPLETE
Bilirubin, UA: NEGATIVE
Glucose, UA: NEGATIVE
Ketones, UA: NEGATIVE
Nitrite, UA: NEGATIVE
Specific Gravity, UA: 1.015 (ref 1.005–1.030)
Urobilinogen, Ur: 0.2 mg/dL (ref 0.2–1.0)
pH, UA: 6 (ref 5.0–7.5)

## 2018-10-19 MED ORDER — LIDOCAINE HCL URETHRAL/MUCOSAL 2 % EX GEL: 1.0000 "application " | Freq: Once | CUTANEOUS | Status: DC

## 2018-10-22 LAB — CULTURE, URINE COMPREHENSIVE

## 2018-10-23 NOTE — Progress Notes (Signed)
   10/23/18  CC:  Chief Complaint  Patient presents with  . Cysto   Urologic history: -Hospitalized 02/06/2017-02/11/2017 to [facility] in [place] for hematuria and acute kidney injury with creatinine 6.9 and CT showing severe bilateral hydronephrosis and hyperdense material in the bladder with an antecedent 1 year history of intermittent gross hematuria and voiding difficulty.  -Initial placement bilateral percutaneous nephrostomy tubes 02/07/2017  -Cystoscopy with clot evacuation; TURBT performed 02/09/2017  -Intraoperative findings extensive papillary tumor estimated >5 cm occupying the entire trigone and obscuring both ureteral orifice ease.  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  Estimated approximately 90% of the tumor resected.  -Pathology low-grade urothelial carcinoma; equivocal subepithelial invasion; no muscle invasion noted  -Nephrostomy tubes converted to bilateral ureteral stents 04/06/2017  -Cystoscopy under anesthesia 04/25/2017; 4 cm papillary tumor posterior wall of the bladder entirely resected  -Pathology Ta urothelial carcinoma, low-grade; muscle present and not involved  -Ureteral stents removed 05/02/2017  -6-week course of intravesical BCG recommended however patient did not follow-up stating he moved to [place] and did not establish urologic care in the area.  -Last creatinine I can find in record review was January 2019 at 2.67   HPI: Patient presents for cystoscopy.  He has no complaints  Blood pressure (!) 149/84, pulse (!) 56, height 5\' 5"  (1.651 m), weight 128 lb 8 oz (58.3 kg). NED. A&Ox3.   No respiratory distress   Abd soft, NT, ND Normal phallus with bilateral descended testicles  Cystoscopy Procedure Note   Patient identification was confirmed, informed consent was obtained, and patient was prepped using Betadine solution.  Lidocaine jelly was administered per urethral meatus.     Pre-Procedure: - Inspection reveals a normal caliber ureteral meatus.  Procedure: The flexible cystoscope was introduced without difficulty.  Due to suboptimal visualization 200 mL of cloudy urine was aspirated through the cystoscope and sent for culture. - No urethral strictures/lesions are present. - Enlarged prostate  - Elevated bladder neck - Bilateral ureteral orifices identified - Bladder mucosa  reveals no ulcers, tumors, or lesions - No bladder stones - No trabeculation  Retroflexion shows no abnormalities   Post-Procedure: - Patient tolerated the procedure well  Assessment/ Plan: History urothelial carcinoma the bladder.  No obvious recurrent tumor on today's cystoscopy however visualization was suboptimal.  I have recommended scheduling cystoscopy under anesthesia with possible bladder biopsies/TURBT.  The indications and nature of the planned procedure were discussed as well as the potential benefits and expected outcome.  Alternatives have been discussed.  Potential complications were discussed including but not limited to bleeding, infection and bladder perforation. The postoperative care and followup was reviewed.  The patient was informed that he may need additional treatment along with periodic surveillance cystoscopy.  All of his questions were answered and he desires to proceed.   Abbie Sons, MD

## 2018-10-25 ENCOUNTER — Other Ambulatory Visit: Payer: Self-pay | Admitting: Family Medicine

## 2018-10-25 ENCOUNTER — Telehealth: Payer: Self-pay | Admitting: Urology

## 2018-10-25 MED ORDER — AMLODIPINE BESYLATE 10 MG PO TABS: 10.0000 mg | ORAL_TABLET | Freq: Every day | ORAL | 0 refills | Status: DC

## 2018-10-25 MED ORDER — METOPROLOL SUCCINATE ER 25 MG PO TB24: 25.0000 mg | ORAL_TABLET | Freq: Every day | ORAL | 0 refills | Status: DC

## 2018-10-25 NOTE — Telephone Encounter (Signed)
Medication Refill - Medication: amLODipine (NORVASC) 10 MG tablet [[id number]]  metoprolol succinate (TOPROL-XL) 25 MG 24 hr tablet     Has the patient contacted their pharmacy? No. (Agent: If no, request that the patient contact the pharmacy for the refill.) (Agent: If yes, when and what did the pharmacy advise?)  Preferred Pharmacy (with phone number or street name): [facility] on [address]   Agent: Please be advised that RX refills may take up to 3 business days. We ask that you follow-up with your pharmacy.

## 2018-10-25 NOTE — Telephone Encounter (Signed)
ERROR

## 2018-10-30 ENCOUNTER — Encounter: Payer: Self-pay | Admitting: Urology

## 2018-10-30 NOTE — Progress Notes (Signed)
10/19/2018 2:18 PM   Jens Som Aug 13, 1955 [id number]  Referring provider: Volney American, PA-C [place],  [place] [address]  Chief Complaint  Patient presents with  . Cysto    HPI: -Hospitalized 02/06/2017-02/11/2017 to [facility] in [place] for hematuria and acute kidney injury with creatinine 6.9 and CT showing severe bilateral hydronephrosis and hyperdense material in the bladder with an antecedent 1 year history of intermittent gross hematuria and voiding difficulty.  -Initial placement bilateral percutaneous nephrostomy tubes 02/07/2017  -Cystoscopy with clot evacuation; TURBT performed 02/09/2017  -Intraoperative findings extensive papillary tumor estimated >5 cm occupying the entire trigone and obscuring both ureteral orifice ease.  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  Estimated approximately 90% of the tumor resected.  -Pathology low-grade urothelial carcinoma; equivocal subepithelial invasion; no muscle invasion noted  -Nephrostomy tubes converted to bilateral ureteral stents 04/06/2017  -Cystoscopy under anesthesia 04/25/2017; 4 cm papillary tumor posterior wall of the bladder entirely resected  -Pathology Ta urothelial carcinoma, low-grade; muscle present and not involved  -Ureteral stents removed 05/02/2017  -6-week course of intravesical BCG recommended however patient did not follow-up stating he moved to [place] and did not establish urologic care in the area.  -Last creatinine I can find in record review was January 2019 at 2.67  Recent office cystoscopy with large amount of sediment and suboptimal visualization.  He presents for cystoscopy under anesthesia, bilateral retrograde pyelograms and TURBT.  PMH: Past  Medical History:  Diagnosis Date  . Cancer [facility])    Bladder    Surgical History: Past Surgical History:  Procedure Laterality Date  . TUMOR REMOVAL  2019   Bladder    Home Medications:  Allergies as of 10/19/2018   No Known Allergies     Medication List       Accurate as of October 19, 2018 11:59 PM. If you have any questions, ask your nurse or doctor.        STOP taking these medications   sulfamethoxazole-trimethoprim 800-160 MG tablet Commonly known as: BACTRIM DS Stopped by: Abbie Sons, MD     TAKE these medications   amLODipine 10 MG tablet Commonly known as: NORVASC Take 1 tablet (10 mg total) by mouth daily.   metoprolol succinate 25 MG 24 hr tablet Commonly known as: TOPROL-XL Take 1 tablet (25 mg total) by mouth daily.   nicotine 21 mg/24hr patch Commonly known as: NICODERM CQ - dosed in mg/24 hours Place 1 patch (21 mg total) onto the skin daily.       Allergies: No Known Allergies  Family History: Family History  Family history unknown: Yes    Social History:  reports that he quit smoking about 7 months ago. His smoking use included cigarettes. He smoked 0.50 packs per day. He has never used smokeless tobacco. He reports that he does not drink alcohol or use drugs.  ROS: Denies chest pain, cough, shortness of breath, nausea, vomiting  Physical Exam: BP (!) 149/84 (BP Location: Left Arm, Patient Position: Sitting, Cuff Size: Normal)   Pulse (!) 56   Ht 5\' 5"  (1.651 m)   Wt 128 lb 8 oz (58.3 kg)   BMI 21.38 kg/m   Constitutional:  Alert and oriented, No acute distress. HEENT: [place] AT, moist mucus membranes.  Trachea midline,  no masses. Cardiovascular: No clubbing, cyanosis, or edema.  RRR Respiratory: Normal respiratory effort, no increased work of breathing.  Clear GI: Abdomen is soft, nontender, nondistended, no abdominal masses GU: No CVA tenderness Lymph: No cervical or inguinal lymphadenopathy. Skin: No rashes, bruises or  suspicious lesions. Neurologic: Grossly intact, no focal deficits, moving all 4 extremities. Psychiatric: Normal mood and affect.  Pertinent Imaging:  Results for orders placed during the hospital encounter of 04/03/18  US RENAL   Narrative CLINICAL DATA:  Acute renal insufficiency. Known bladder cancer. Patient has an appointment with the nephrologist this morning.  EXAM: RENAL / URINARY TRACT ULTRASOUND COMPLETE  COMPARISON:  None.  FINDINGS: Right Kidney:  Renal measurements: 10.5 x 6.1 x 7.4 cm = volume: 248 mL. Severe hydronephrosis.  Left Kidney:  Renal measurements: 12.5 x 5.5 x 5.1 cm = volume: 184 mL. Severe hydronephrosis  Bladder:  The posterior wall of the bladder is thickened to 7 or 8 mm. Bilateral ureteral jets are noted. Debris is seen in the bladder and the dilated upper renal collecting systems. The pre void volume is 321 cc. The postvoid volume is 71 cc.  IMPRESSION: 1. Severe bilateral hydronephrosis. There is some debris within the dilated collecting systems. 2. The posterior wall of the bladder is thickened. No other evidence of the reported known bladder cancer. The postvoid volume is 71 cc. There is debris within the urine within the bladder.   Electronically Signed   By: Dorise Bullion III M.D   On: 04/03/2018 08:39      Assessment & Plan:    - History of bladder cancer 18 months overdue for surveillance cystoscopy with history of significant urothelial carcinoma the bladder. Office cystoscopy suboptimal and he presents for cystoscopy and anesthesia with bilateral retrograde pyelograms.    Abbie Sons, [place] [address] [place], [place] [address] [phone]

## 2018-10-31 ENCOUNTER — Other Ambulatory Visit: Payer: Self-pay | Admitting: Urology

## 2018-10-31 ENCOUNTER — Telehealth: Payer: Self-pay

## 2018-10-31 MED ORDER — SULFAMETHOXAZOLE-TRIMETHOPRIM 800-160 MG PO TABS: 1.0000 | ORAL_TABLET | Freq: Two times a day (BID) | ORAL | 0 refills | Status: AC

## 2018-10-31 NOTE — Telephone Encounter (Signed)
-----   Message from Abbie Sons, MD sent at 10/31/2018  7:37 AM EDT ----- Urine culture was positive for bacteria.  Antibiotic Rx was sent to pharmacy.  He should be hearing from our surgery scheduler this week.

## 2018-10-31 NOTE — Progress Notes (Signed)
Urine culture was positive for bacteria.  Antibiotic Rx was sent to pharmacy.  He should be hearing from our surgery scheduler this week.

## 2018-10-31 NOTE — Telephone Encounter (Signed)
Called pt informed him of the information below. Pt gave verbal understanding.  

## 2018-11-09 ENCOUNTER — Other Ambulatory Visit: Payer: Self-pay | Admitting: Radiology

## 2018-11-09 DIAGNOSIS — Z8551 Personal history of malignant neoplasm of bladder: Secondary | ICD-10-CM

## 2018-11-14 ENCOUNTER — Other Ambulatory Visit
Admission: RE | Admit: 2018-11-14 | Discharge: 2018-11-14 | Disposition: A | Discharge status: Home / Self Care | Payer: Medicaid Other | Source: Ambulatory Visit | Attending: Urology | Admitting: Urology

## 2018-11-14 DIAGNOSIS — Z8551 Personal history of malignant neoplasm of bladder: Secondary | ICD-10-CM | POA: Diagnosis present

## 2018-11-14 LAB — URINALYSIS, COMPLETE (UACMP) WITH MICROSCOPIC
Bacteria, UA: NONE SEEN
Bilirubin Urine: NEGATIVE
Glucose, UA: NEGATIVE mg/dL
Hgb urine dipstick: NEGATIVE
Ketones, ur: NEGATIVE mg/dL
Leukocytes,Ua: NEGATIVE
Nitrite: NEGATIVE
Protein, ur: 100 mg/dL — AB
Specific Gravity, Urine: 1.013 (ref 1.005–1.030)
Squamous Epithelial / HPF: NONE SEEN (ref 0–5)
pH: 5 (ref 5.0–8.0)

## 2018-11-15 LAB — URINE CULTURE: Culture: NO GROWTH

## 2018-11-21 ENCOUNTER — Other Ambulatory Visit: Payer: Self-pay

## 2018-11-21 ENCOUNTER — Encounter
Admission: RE | Admit: 2018-11-21 | Discharge: 2018-11-21 | Disposition: A | Discharge status: Home / Self Care | Payer: Medicaid Other | Source: Ambulatory Visit | Attending: Urology | Admitting: Urology

## 2018-11-21 DIAGNOSIS — Z01818 Encounter for other preprocedural examination: Secondary | ICD-10-CM | POA: Insufficient documentation

## 2018-11-21 DIAGNOSIS — I10 Essential (primary) hypertension: Secondary | ICD-10-CM | POA: Insufficient documentation

## 2018-11-21 DIAGNOSIS — Z20828 Contact with and (suspected) exposure to other viral communicable diseases: Secondary | ICD-10-CM | POA: Insufficient documentation

## 2018-11-21 HISTORY — DX: Chronic kidney disease, unspecified: N18.9

## 2018-11-21 HISTORY — DX: Essential (primary) hypertension: I10

## 2018-11-21 HISTORY — DX: Neoplasm of unspecified behavior of bladder: D49.4

## 2018-11-21 NOTE — Patient Instructions (Signed)
Your procedure is scheduled on: 11/28/2018 Tues Report to Same Day Surgery 2nd floor medical mall [facility] Entrance-take elevator on left to 2nd floor.  Check in with surgery information desk.) To find out your arrival time please call [phone] between 1PM - 3PM on 11/27/2018 Mon  Remember: Instructions that are not followed completely may result in serious medical risk, up to and including death, or upon the discretion of your surgeon and anesthesiologist your surgery may need to be rescheduled.    _x___ 1. Do not eat food after midnight the night before your procedure. You may drink clear liquids up to 2 hours before you are scheduled to arrive at the hospital for your procedure.  Do not drink clear liquids within 2 hours of your scheduled arrival to the hospital.  Clear liquids include  --Water or Apple juice without pulp  --Clear carbohydrate beverage such as ClearFast or Gatorade  --Black Coffee or Clear Tea (No milk, no creamers, do not add anything to                  the coffee or Tea Type 1 and type 2 diabetics should only drink water.   ____Ensure clear carbohydrate drink on the way to the hospital for bariatric patients  ____Ensure clear carbohydrate drink 3 hours before surgery.   No gum chewing or hard candies.     __x__ 2. No Alcohol for 24 hours before or after surgery.   __x__3. No Smoking or e-cigarettes for 24 prior to surgery.  Do not use any chewable tobacco products for at least 6 hour prior to surgery   ____  4. Bring all medications with you on the day of surgery if instructed.    __x__ 5. Notify your doctor if there is any change in your medical condition     (cold, fever, infections).    x___6. On the morning of surgery brush your teeth with toothpaste and water.  You may rinse your mouth with mouth wash if you wish.  Do not swallow any toothpaste or mouthwash.   Do not wear jewelry, make-up, hairpins, clips or nail polish.  Do not wear lotions,  powders, or perfumes. You may wear deodorant.  Do not shave 48 hours prior to surgery. Men may shave face and neck.  Do not bring valuables to the hospital.    [facility] is not responsible for any belongings or valuables.               Contacts, dentures or bridgework may not be worn into surgery.  Leave your suitcase in the car. After surgery it may be brought to your room.  For patients admitted to the hospital, discharge time is determined by your                       treatment team.  _  Patients discharged the day of surgery will not be allowed to drive home.  You will need someone to drive you home and stay with you the night of your procedure.    Please read over the following fact sheets that you were given:   [facility] Preparing for Surgery and or MRSA Information   _x___ Take anti-hypertensive listed below, cardiac, seizure, asthma,     anti-reflux and psychiatric medicines. These include:  1. amLODipine (NORVASC) 10 MG tablet  2.metoprolol succinate (TOPROL-XL) 25 MG 24 hr tablet  3.  4.  5.  6.  ____Fleets enema or  Magnesium Citrate as directed.   _x___ Use CHG Soap or sage wipes as directed on instruction sheet   ____ Use inhalers on the day of surgery and bring to hospital day of surgery  ____ Stop Metformin and Janumet 2 days prior to surgery.    ____ Take 1/2 of usual insulin dose the night before surgery and none on the morning     surgery.   _x___ Follow recommendations from Cardiologist, Pulmonologist or PCP regarding          stopping Aspirin, Coumadin, Plavix ,Eliquis, Effient, or Pradaxa, and Pletal.  X____Stop Anti-inflammatories such as Advil, Aleve, Ibuprofen, Motrin, Naproxen, Naprosyn, Goodies powders or aspirin products. OK to take Tylenol and                          Celebrex.   _x___ Stop supplements until after surgery.  But may continue Vitamin D, Vitamin B,       and multivitamin.   ____ Bring C-Pap to the hospital.

## 2018-11-24 ENCOUNTER — Other Ambulatory Visit: Payer: Medicaid Other

## 2018-11-24 ENCOUNTER — Encounter
Admission: RE | Admit: 2018-11-24 | Discharge: 2018-11-24 | Disposition: A | Discharge status: Home / Self Care | Payer: Medicaid Other | Source: Ambulatory Visit | Attending: Urology | Admitting: Urology

## 2018-11-24 ENCOUNTER — Encounter: Payer: Self-pay | Admitting: Anesthesiology

## 2018-11-24 ENCOUNTER — Other Ambulatory Visit: Payer: Self-pay

## 2018-11-24 ENCOUNTER — Telehealth: Payer: Self-pay | Admitting: Family Medicine

## 2018-11-24 DIAGNOSIS — I10 Essential (primary) hypertension: Secondary | ICD-10-CM

## 2018-11-24 DIAGNOSIS — Z01818 Encounter for other preprocedural examination: Secondary | ICD-10-CM | POA: Diagnosis present

## 2018-11-24 DIAGNOSIS — R9431 Abnormal electrocardiogram [ECG] [EKG]: Secondary | ICD-10-CM

## 2018-11-24 DIAGNOSIS — Z20828 Contact with and (suspected) exposure to other viral communicable diseases: Secondary | ICD-10-CM | POA: Diagnosis not present

## 2018-11-24 LAB — CBC
HCT: 37.4 % — ABNORMAL LOW (ref 39.0–52.0)
Hemoglobin: 11.5 g/dL — ABNORMAL LOW (ref 13.0–17.0)
MCH: 27.7 pg (ref 26.0–34.0)
MCHC: 30.7 g/dL (ref 30.0–36.0)
MCV: 90.1 fL (ref 80.0–100.0)
Platelets: 242 10*3/uL (ref 150–400)
RBC: 4.15 MIL/uL — ABNORMAL LOW (ref 4.22–5.81)
RDW: 15.9 % — ABNORMAL HIGH (ref 11.5–15.5)
WBC: 6.3 10*3/uL (ref 4.0–10.5)
nRBC: 0 % (ref 0.0–0.2)

## 2018-11-24 LAB — BASIC METABOLIC PANEL
Anion gap: 6 (ref 5–15)
BUN: 65 mg/dL — ABNORMAL HIGH (ref 6–20)
CO2: 21 mmol/L — ABNORMAL LOW (ref 22–32)
Calcium: 8 mg/dL — ABNORMAL LOW (ref 8.9–10.3)
Chloride: 114 mmol/L — ABNORMAL HIGH (ref 98–111)
Creatinine, Ser: 2.64 mg/dL — ABNORMAL HIGH (ref 0.61–1.24)
GFR calc Af Amer: 29 mL/min — ABNORMAL LOW (ref >60)
GFR calc non Af Amer: 25 mL/min — ABNORMAL LOW (ref >60)
Glucose, Bld: 92 mg/dL (ref 70–99)
Potassium: 5.7 mmol/L — ABNORMAL HIGH (ref 3.5–5.1)
Sodium: 141 mmol/L (ref 135–145)

## 2018-11-24 LAB — SARS CORONAVIRUS 2 (TAT 6-24 HRS): SARS Coronavirus 2: NEGATIVE

## 2018-11-24 NOTE — Telephone Encounter (Signed)
Will he need a surgical clearance if they have an EKG on him already?

## 2018-11-24 NOTE — Telephone Encounter (Signed)
Leah with [facility] calling and states that a form was faxed over yesterday for the patient to get surgical clearance for a surgery next week. Advised that there was no documentation of receiving this form. States that she would re-fax form, along with EKG. Please advise.

## 2018-11-24 NOTE — Pre-Procedure Instructions (Signed)
Patient sates "had chest pain on Wed came to hospital had blood work and EKG." I was sent home. "  Unable to locate any information in Epic.Secure chat with Dr Rosey Bath.  Cardiac clearance requested.

## 2018-11-24 NOTE — Pre-Procedure Instructions (Signed)
Dr Dene Gentry office and PCP, Merrie Roof, notified regarding need for cardiac clearance.Called and faxed the information.

## 2018-11-25 NOTE — Pre-Procedure Instructions (Signed)
CBC results sent to Dr. Bernardo Heater for review.

## 2018-11-26 LAB — URINE CULTURE: Culture: >=100000 — AB

## 2018-11-27 NOTE — Pre-Procedure Instructions (Signed)
Has appointment with PCP and [facility] cardiology.  Surgery rescheduled for 12/05/2018

## 2018-11-27 NOTE — Telephone Encounter (Signed)
Placed urgent Cardiology referral to eval abnormal EKG

## 2018-11-27 NOTE — Telephone Encounter (Signed)
Patient scheduled for 2:00PM tomorrow for Cardiology and 2:30 for here at [facility]. Patient stated he could not get off work and he works until 4:00.  Patient notified of both appointments and [facility] notified and is rescheduling surgery.   Will fax form to [facility].  Routing to provider as Juluis Rainier.

## 2018-11-29 ENCOUNTER — Ambulatory Visit: Payer: Self-pay | Admitting: Family Medicine

## 2018-11-30 ENCOUNTER — Other Ambulatory Visit: Payer: Self-pay | Admitting: Cardiology

## 2018-11-30 ENCOUNTER — Other Ambulatory Visit: Payer: Self-pay

## 2018-11-30 ENCOUNTER — Encounter: Payer: Self-pay | Admitting: Family Medicine

## 2018-11-30 ENCOUNTER — Ambulatory Visit (INDEPENDENT_AMBULATORY_CARE_PROVIDER_SITE_OTHER): Payer: Medicaid Other | Admitting: Family Medicine

## 2018-11-30 VITALS — BP 140/112 | HR 79 | Temp 98.3°F

## 2018-11-30 DIAGNOSIS — R079 Chest pain, unspecified: Secondary | ICD-10-CM

## 2018-11-30 DIAGNOSIS — R0602 Shortness of breath: Secondary | ICD-10-CM

## 2018-11-30 DIAGNOSIS — I10 Essential (primary) hypertension: Secondary | ICD-10-CM | POA: Diagnosis not present

## 2018-11-30 DIAGNOSIS — E875 Hyperkalemia: Secondary | ICD-10-CM

## 2018-11-30 DIAGNOSIS — Z8551 Personal history of malignant neoplasm of bladder: Secondary | ICD-10-CM | POA: Diagnosis not present

## 2018-11-30 DIAGNOSIS — Z01818 Encounter for other preprocedural examination: Secondary | ICD-10-CM

## 2018-11-30 DIAGNOSIS — R9431 Abnormal electrocardiogram [ECG] [EKG]: Secondary | ICD-10-CM

## 2018-11-30 DIAGNOSIS — N1330 Unspecified hydronephrosis: Secondary | ICD-10-CM

## 2018-11-30 MED ORDER — METOPROLOL SUCCINATE ER 25 MG PO TB24: 25.0000 mg | ORAL_TABLET | Freq: Every day | ORAL | 1 refills | Status: DC

## 2018-11-30 MED ORDER — AMLODIPINE BESYLATE 10 MG PO TABS: 10.0000 mg | ORAL_TABLET | Freq: Every day | ORAL | 1 refills | Status: DC

## 2018-11-30 NOTE — Progress Notes (Signed)
BP (!) 140/112   Pulse 79   Temp 98.3 F (36.8 C) (Oral)   SpO2 100%    Subjective:    Patient ID: Anthony Hess, male    DOB: Jan 21, 1956, 63 y.o.   MRN: [id number]  HPI: Anthony Hess is a 64 y.o. male  Chief Complaint  Patient presents with  . Surgical Clearance    not cleared by cardio, states he is having a stress test and echo tomorrow   Patient presenting today to discuss pre-surgical evaluation. Had pre-op labs and EKG at the hospital and was found to be hyperkalemic and with abnormal EKG changes. Urgent referral was placed to Cardiology for further evaluation, and he is currently awaiting echocardiogram and nuclear stress test prior to gaining cardiac clearance. Reports currently having frequent anginal episodes of chest pain and pressure, relived by the nitro tabs recently given through Cardiology. Otherwise, feeling in usual state of health.   He also notes running out of his BP medications 2 days ago. Prior to that, his BPs had been remaining WNL.   Relevant past medical, surgical, family and social history reviewed and updated as indicated. Interim medical history since our last visit reviewed. Allergies and medications reviewed and updated.  Review of Systems  Per HPI unless specifically indicated above     Objective:    BP (!) 140/112   Pulse 79   Temp 98.3 F (36.8 C) (Oral)   SpO2 100%   Wt Readings from Last 3 Encounters:  11/21/18 130 lb (59 kg)  10/19/18 128 lb 8 oz (58.3 kg)  08/31/18 127 lb (57.6 kg)    Physical Exam Vitals signs and nursing note reviewed.  Constitutional:      Appearance: Normal appearance.  HENT:     Head: Atraumatic.  Eyes:     Extraocular Movements: Extraocular movements intact.     Conjunctiva/sclera: Conjunctivae normal.  Neck:     Musculoskeletal: Normal range of motion and neck supple.  Cardiovascular:     Rate and Rhythm: Normal rate and regular rhythm.  Pulmonary:     Effort: Pulmonary effort is normal.   Breath sounds: Normal breath sounds.  Musculoskeletal: Normal range of motion.  Skin:    General: Skin is warm and dry.  Neurological:     General: No focal deficit present.     Mental Status: He is oriented to person, place, and time.  Psychiatric:        Mood and Affect: Mood normal.        Thought Content: Thought content normal.        Judgment: Judgment normal.     Results for orders placed or performed in visit on 35/00/93  Basic metabolic panel  Result Value Ref Range   Glucose 92 65 - 99 mg/dL   BUN 53 (H) 8 - 27 mg/dL   Creatinine, Ser 2.73 (H) 0.76 - 1.27 mg/dL   GFR calc non Af Amer 24 (L) >59 mL/min/1.73   GFR calc Af Amer 28 (L) >59 mL/min/1.73   BUN/Creatinine Ratio 19 10 - 24   Sodium 142 134 - 144 mmol/L   Potassium 5.2 3.5 - 5.2 mmol/L   Chloride 113 (H) 96 - 106 mmol/L   CO2 15 (L) 20 - 29 mmol/L   Calcium 8.1 (L) 8.6 - 10.2 mg/dL      Assessment & Plan:   Problem List Items Addressed This Visit      Cardiovascular and Mediastinum   Essential hypertension  Elevated today but reports being out of his BP medication x 2 days. Will refill and restart his regimen. Typically WNL during home checks. Has multiple Cardiology appts in next 2 weeks for further testing, can have BP rechecked at these prior to surgical clearance      Relevant Medications   nitroGLYCERIN (NITROSTAT) 0.4 MG SL tablet   metoprolol succinate (TOPROL-XL) 25 MG 24 hr tablet   amLODipine (NORVASC) 10 MG tablet     Genitourinary   Bilateral hydronephrosis    With CKD. Awaiting Urology procedure        Other   History of bladder cancer    Other Visit Diagnoses    Abnormal EKG    -  Primary   Being worked up by Cardiology, awaiting echocardiogram and stress test prior to surgical clearance   Hyperkalemia       Recheck BMP. Avoid high potassium foods. Push fluids   Relevant Orders   Basic metabolic panel (Completed)       Follow up plan: Return in about 6 months (around  05/31/2019) for 6 month f/u.

## 2018-12-01 ENCOUNTER — Ambulatory Visit
Admission: RE | Admit: 2018-12-01 | Discharge: 2018-12-01 | Disposition: A | Discharge status: Home / Self Care | Payer: Medicaid Other | Source: Ambulatory Visit | Attending: Cardiology | Admitting: Cardiology

## 2018-12-01 ENCOUNTER — Other Ambulatory Visit: Payer: Self-pay

## 2018-12-01 ENCOUNTER — Other Ambulatory Visit: Payer: Medicaid Other

## 2018-12-01 DIAGNOSIS — R079 Chest pain, unspecified: Secondary | ICD-10-CM | POA: Diagnosis present

## 2018-12-01 DIAGNOSIS — Z01818 Encounter for other preprocedural examination: Secondary | ICD-10-CM | POA: Diagnosis present

## 2018-12-01 DIAGNOSIS — R0602 Shortness of breath: Secondary | ICD-10-CM | POA: Insufficient documentation

## 2018-12-01 LAB — BASIC METABOLIC PANEL
BUN/Creatinine Ratio: 19 (ref 10–24)
BUN: 53 mg/dL — ABNORMAL HIGH (ref 8–27)
CO2: 15 mmol/L — ABNORMAL LOW (ref 20–29)
Calcium: 8.1 mg/dL — ABNORMAL LOW (ref 8.6–10.2)
Chloride: 113 mmol/L — ABNORMAL HIGH (ref 96–106)
Creatinine, Ser: 2.73 mg/dL — ABNORMAL HIGH (ref 0.76–1.27)
GFR calc Af Amer: 28 mL/min/{1.73_m2} — ABNORMAL LOW (ref >59)
GFR calc non Af Amer: 24 mL/min/{1.73_m2} — ABNORMAL LOW (ref >59)
Glucose: 92 mg/dL (ref 65–99)
Potassium: 5.2 mmol/L (ref 3.5–5.2)
Sodium: 142 mmol/L (ref 134–144)

## 2018-12-01 LAB — NM MYOCAR MULTI W/SPECT W/WALL MOTION / EF
Estimated workload: 1 METS
Exercise duration (min): 1 min
Exercise duration (sec): 0 s
LV dias vol: 119 mL (ref 62–150)
LV sys vol: 87 mL
MPHR: 160 {beats}/min
Peak HR: 85 {beats}/min
Percent HR: 53 %
Rest HR: 60 {beats}/min
SDS: 4
SRS: 8
SSS: 16
TID: 1.05

## 2018-12-01 MED ORDER — TECHNETIUM TC 99M TETROFOSMIN IV KIT
32.7700 | PACK | Freq: Once | INTRAVENOUS | Status: AC | PRN
  Administered 2018-12-01: 32.77 via INTRAVENOUS

## 2018-12-01 MED ORDER — REGADENOSON 0.4 MG/5ML IV SOLN
0.4000 mg | Freq: Once | INTRAVENOUS | Status: AC
  Administered 2018-12-01: 0.4 mg via INTRAVENOUS

## 2018-12-01 MED ORDER — TECHNETIUM TC 99M TETROFOSMIN IV KIT
10.9500 | PACK | Freq: Once | INTRAVENOUS | Status: AC | PRN
  Administered 2018-12-01: 10.95 via INTRAVENOUS

## 2018-12-05 ENCOUNTER — Other Ambulatory Visit: Payer: Self-pay | Admitting: Radiology

## 2018-12-05 ENCOUNTER — Ambulatory Visit: Payer: Self-pay | Admitting: Family Medicine

## 2018-12-05 ENCOUNTER — Ambulatory Visit
Admission: RE | Admit: 2018-12-05 | Discharge: 2018-12-05 | Disposition: A | Discharge status: Home / Self Care | Payer: Medicaid Other | Source: Ambulatory Visit | Attending: Cardiology | Admitting: Cardiology

## 2018-12-05 ENCOUNTER — Other Ambulatory Visit: Payer: Self-pay

## 2018-12-05 ENCOUNTER — Ambulatory Visit: Payer: Medicaid Other

## 2018-12-05 ENCOUNTER — Telehealth: Payer: Self-pay | Admitting: Radiology

## 2018-12-05 DIAGNOSIS — I420 Dilated cardiomyopathy: Secondary | ICD-10-CM

## 2018-12-05 DIAGNOSIS — N189 Chronic kidney disease, unspecified: Secondary | ICD-10-CM | POA: Diagnosis not present

## 2018-12-05 DIAGNOSIS — Z8551 Personal history of malignant neoplasm of bladder: Secondary | ICD-10-CM

## 2018-12-05 DIAGNOSIS — I070 Rheumatic tricuspid stenosis: Secondary | ICD-10-CM | POA: Insufficient documentation

## 2018-12-05 DIAGNOSIS — N390 Urinary tract infection, site not specified: Secondary | ICD-10-CM

## 2018-12-05 DIAGNOSIS — R0602 Shortness of breath: Secondary | ICD-10-CM | POA: Insufficient documentation

## 2018-12-05 DIAGNOSIS — R079 Chest pain, unspecified: Secondary | ICD-10-CM | POA: Diagnosis not present

## 2018-12-05 DIAGNOSIS — I5020 Unspecified systolic (congestive) heart failure: Secondary | ICD-10-CM

## 2018-12-05 HISTORY — DX: Unspecified systolic (congestive) heart failure: I50.20

## 2018-12-05 HISTORY — DX: Dilated cardiomyopathy: I42.0

## 2018-12-05 MED ORDER — SULFAMETHOXAZOLE-TRIMETHOPRIM 800-160 MG PO TABS: 1.0000 | ORAL_TABLET | Freq: Two times a day (BID) | ORAL | 0 refills | Status: DC

## 2018-12-05 NOTE — Assessment & Plan Note (Signed)
With CKD. Awaiting Urology procedure

## 2018-12-05 NOTE — Assessment & Plan Note (Addendum)
Elevated today but reports being out of his BP medication x 2 days. Will refill and restart his regimen. Typically WNL during home checks. Has multiple Cardiology appts in next 2 weeks for further testing, can have BP rechecked at these prior to surgical clearance

## 2018-12-05 NOTE — Progress Notes (Signed)
*  PRELIMINARY RESULTS* Echocardiogram 2D Echocardiogram has been performed.  Sherrie Sport 12/05/2018, 10:37 AM

## 2018-12-05 NOTE — Telephone Encounter (Signed)
Notified wife of script sent to pharmacy & to begin taking antibiotic on 12/16/2018. Perioperative antibiotic changed to Cipro 400mg  IV per Dr Bernardo Heater.

## 2018-12-05 NOTE — Telephone Encounter (Signed)
-----   Message from Abbie Sons, MD sent at 12/05/2018 11:48 AM EDT ----- He is asymptomatic and this represents colonization.  Would recommend starting Septra DS 1 twice daily 3 days prior to the procedure for a total of 5 days.  Would also change IV antibiotics to Cipro 400 mg ----- Message ----- From: Ranell Patrick, RN Sent: 12/05/2018  10:41 AM EDT To: Abbie Sons, MD  Does this patient need abx?

## 2018-12-07 ENCOUNTER — Encounter: Payer: Self-pay | Admitting: Family Medicine

## 2018-12-08 ENCOUNTER — Ambulatory Visit: Payer: Self-pay | Admitting: Family Medicine

## 2018-12-08 ENCOUNTER — Ambulatory Visit: Payer: Medicaid Other

## 2018-12-15 ENCOUNTER — Inpatient Hospital Stay
Admission: RE | Admit: 2018-12-15 | Discharge: 2018-12-15 | Disposition: A | Discharge status: Home / Self Care | Payer: Medicaid Other | Source: Ambulatory Visit | Attending: Urology | Admitting: Urology

## 2018-12-15 NOTE — Progress Notes (Signed)
Patient did not show up for covid test today as scheduled. Tried to call but went to voice mail and could not leave a message.Will contact Dr. Bernardo Heater office to inform them.

## 2018-12-18 ENCOUNTER — Other Ambulatory Visit: Payer: Self-pay

## 2018-12-18 ENCOUNTER — Other Ambulatory Visit
Admission: RE | Admit: 2018-12-18 | Discharge: 2018-12-18 | Disposition: A | Discharge status: Home / Self Care | Payer: Medicaid Other | Source: Ambulatory Visit | Attending: Cardiology | Admitting: Cardiology

## 2018-12-18 DIAGNOSIS — Z01812 Encounter for preprocedural laboratory examination: Secondary | ICD-10-CM | POA: Diagnosis not present

## 2018-12-18 DIAGNOSIS — Z20828 Contact with and (suspected) exposure to other viral communicable diseases: Secondary | ICD-10-CM | POA: Insufficient documentation

## 2018-12-19 ENCOUNTER — Ambulatory Visit: Admission: RE | Admit: 2018-12-19 | Payer: Medicaid Other | Source: Home / Self Care | Admitting: Urology

## 2018-12-19 ENCOUNTER — Encounter: Admission: RE | Payer: Self-pay | Source: Home / Self Care

## 2018-12-19 LAB — SARS CORONAVIRUS 2 (TAT 6-24 HRS): SARS Coronavirus 2: NEGATIVE

## 2018-12-19 SURGERY — CYSTOSCOPY, WITH RETROGRADE PYELOGRAM
Anesthesia: Choice

## 2018-12-20 ENCOUNTER — Encounter
Admission: RE | Disposition: A | Discharge status: Home / Self Care | Payer: Self-pay | Source: Home / Self Care | Attending: Cardiology

## 2018-12-20 ENCOUNTER — Other Ambulatory Visit: Payer: Self-pay

## 2018-12-20 ENCOUNTER — Ambulatory Visit
Admission: RE | Admit: 2018-12-20 | Discharge: 2018-12-20 | Disposition: A | Discharge status: Home / Self Care | Payer: Medicaid Other | Attending: Cardiology | Admitting: Cardiology

## 2018-12-20 DIAGNOSIS — I420 Dilated cardiomyopathy: Secondary | ICD-10-CM | POA: Insufficient documentation

## 2018-12-20 DIAGNOSIS — R079 Chest pain, unspecified: Secondary | ICD-10-CM | POA: Insufficient documentation

## 2018-12-20 DIAGNOSIS — Z79899 Other long term (current) drug therapy: Secondary | ICD-10-CM | POA: Insufficient documentation

## 2018-12-20 DIAGNOSIS — I2510 Atherosclerotic heart disease of native coronary artery without angina pectoris: Secondary | ICD-10-CM

## 2018-12-20 DIAGNOSIS — Z8551 Personal history of malignant neoplasm of bladder: Secondary | ICD-10-CM | POA: Insufficient documentation

## 2018-12-20 DIAGNOSIS — F17210 Nicotine dependence, cigarettes, uncomplicated: Secondary | ICD-10-CM | POA: Insufficient documentation

## 2018-12-20 HISTORY — PX: LEFT HEART CATH AND CORONARY ANGIOGRAPHY: CATH118249

## 2018-12-20 HISTORY — DX: Atherosclerotic heart disease of native coronary artery without angina pectoris: I25.10

## 2018-12-20 LAB — BASIC METABOLIC PANEL
Anion gap: 8 (ref 5–15)
BUN: 58 mg/dL — ABNORMAL HIGH (ref 6–20)
CO2: 20 mmol/L — ABNORMAL LOW (ref 22–32)
Calcium: 7.8 mg/dL — ABNORMAL LOW (ref 8.9–10.3)
Chloride: 116 mmol/L — ABNORMAL HIGH (ref 98–111)
Creatinine, Ser: 2.84 mg/dL — ABNORMAL HIGH (ref 0.61–1.24)
GFR calc Af Amer: 27 mL/min — ABNORMAL LOW (ref >60)
GFR calc non Af Amer: 23 mL/min — ABNORMAL LOW (ref >60)
Glucose, Bld: 94 mg/dL (ref 70–99)
Potassium: 4.9 mmol/L (ref 3.5–5.1)
Sodium: 144 mmol/L (ref 135–145)

## 2018-12-20 SURGERY — LEFT HEART CATH AND CORONARY ANGIOGRAPHY
Anesthesia: Moderate Sedation | Laterality: Left

## 2018-12-20 MED ORDER — SODIUM CHLORIDE 0.9 % IV SOLN: 250.0000 mL | INTRAVENOUS | Status: DC | PRN

## 2018-12-20 MED ORDER — HEPARIN (PORCINE) IN NACL 1000-0.9 UT/500ML-% IV SOLN
INTRAVENOUS | Status: DC | PRN
  Administered 2018-12-20: 500 mL

## 2018-12-20 MED ORDER — ASPIRIN 81 MG PO CHEW
81.0000 mg | CHEWABLE_TABLET | ORAL | Status: AC
  Administered 2018-12-20: 81 mg via ORAL

## 2018-12-20 MED ORDER — VERAPAMIL HCL 2.5 MG/ML IV SOLN
INTRAVENOUS | Status: AC
  Filled 2018-12-20: qty 2

## 2018-12-20 MED ORDER — CIPROFLOXACIN IN D5W 400 MG/200ML IV SOLN: 400.0000 mg | Freq: Once | INTRAVENOUS | Status: DC

## 2018-12-20 MED ORDER — SODIUM CHLORIDE 0.9% FLUSH: 3.0000 mL | Freq: Two times a day (BID) | INTRAVENOUS | Status: DC

## 2018-12-20 MED ORDER — IOHEXOL 300 MG/ML  SOLN
INTRAMUSCULAR | Status: DC | PRN
  Administered 2018-12-20: 40 mL

## 2018-12-20 MED ORDER — HYDRALAZINE HCL 20 MG/ML IJ SOLN: 10.0000 mg | INTRAMUSCULAR | Status: DC | PRN

## 2018-12-20 MED ORDER — FENTANYL CITRATE (PF) 100 MCG/2ML IJ SOLN
INTRAMUSCULAR | Status: DC | PRN
  Administered 2018-12-20: 25 ug via INTRAVENOUS

## 2018-12-20 MED ORDER — HEPARIN SODIUM (PORCINE) 1000 UNIT/ML IJ SOLN
INTRAMUSCULAR | Status: DC | PRN
  Administered 2018-12-20: 3000 [IU] via INTRAVENOUS

## 2018-12-20 MED ORDER — ASPIRIN 81 MG PO CHEW
CHEWABLE_TABLET | ORAL | Status: AC
  Filled 2018-12-20: qty 1

## 2018-12-20 MED ORDER — SODIUM CHLORIDE 0.9 % IV SOLN: INTRAVENOUS | Status: DC

## 2018-12-20 MED ORDER — ONDANSETRON HCL 4 MG/2ML IJ SOLN: 4.0000 mg | Freq: Four times a day (QID) | INTRAMUSCULAR | Status: DC | PRN

## 2018-12-20 MED ORDER — LABETALOL HCL 5 MG/ML IV SOLN: 10.0000 mg | INTRAVENOUS | Status: DC | PRN

## 2018-12-20 MED ORDER — VERAPAMIL HCL 2.5 MG/ML IV SOLN
INTRAVENOUS | Status: DC | PRN
  Administered 2018-12-20: 2.5 mg via INTRA_ARTERIAL

## 2018-12-20 MED ORDER — FENTANYL CITRATE (PF) 100 MCG/2ML IJ SOLN
INTRAMUSCULAR | Status: AC
  Filled 2018-12-20: qty 2

## 2018-12-20 MED ORDER — HEPARIN (PORCINE) IN NACL 1000-0.9 UT/500ML-% IV SOLN
INTRAVENOUS | Status: AC
  Filled 2018-12-20: qty 1000

## 2018-12-20 MED ORDER — MIDAZOLAM HCL 2 MG/2ML IJ SOLN
INTRAMUSCULAR | Status: AC
  Filled 2018-12-20: qty 2

## 2018-12-20 MED ORDER — MIDAZOLAM HCL 2 MG/2ML IJ SOLN
INTRAMUSCULAR | Status: DC | PRN
  Administered 2018-12-20: 1 mg via INTRAVENOUS

## 2018-12-20 MED ORDER — SODIUM CHLORIDE 0.9 % WEIGHT BASED INFUSION: 1.0000 mL/kg/h | INTRAVENOUS | Status: DC

## 2018-12-20 MED ORDER — SODIUM CHLORIDE 0.9% FLUSH: 3.0000 mL | INTRAVENOUS | Status: DC | PRN

## 2018-12-20 MED ORDER — SODIUM CHLORIDE 0.9 % IV BOLUS: 300.0000 mL | Freq: Once | INTRAVENOUS | Status: DC

## 2018-12-20 MED ORDER — ACETAMINOPHEN 325 MG PO TABS: 650.0000 mg | ORAL_TABLET | ORAL | Status: DC | PRN

## 2018-12-20 MED ORDER — HEPARIN SODIUM (PORCINE) 1000 UNIT/ML IJ SOLN
INTRAMUSCULAR | Status: AC
  Filled 2018-12-20: qty 1

## 2018-12-20 SURGICAL SUPPLY — 6 items
CATH 5F 110X4 TIG (CATHETERS) ×3 IMPLANT
DEVICE RAD TR BAND REGULAR (VASCULAR PRODUCTS) ×3 IMPLANT
GLIDESHEATH SLEND SS 6F .021 (SHEATH) ×3 IMPLANT
KIT MANI 3VAL PERCEP (MISCELLANEOUS) ×3 IMPLANT
PACK CARDIAC CATH (CUSTOM PROCEDURE TRAY) ×3 IMPLANT
WIRE ROSEN-J .035X260CM (WIRE) ×3 IMPLANT

## 2018-12-20 NOTE — Discharge Instructions (Signed)
Coronary Artery Bypass Grafting  Coronary artery bypass grafting (CABG) is a surgery that is done when arteries of the heart have become narrow or blocked. This is often caused by the buildup of fat called plaques. These arteries give the heart the oxygen and nutrients it needs to pump blood to your body. During CABG, a section of blood vessel from another part of the body is taken. This section is called a graft. The graft is placed where there is narrowing or blockage. Tell your doctor about:  Any allergies you have.  All medicines you are taking. Tell him or her about any steroids, blood thinners, vitamins, herbs, eye drops, creams, and over-the-counter medicines.  Any problems you or family members have had with anesthetic medicines.  Any blood disorders you have.  Any surgeries you have had.  Any medical conditions you have.  Whether you are pregnant or may be pregnant. What are the risks? Generally, this is a safe procedure. However, problems may occur, including:  Bleeding. You may need to get blood through an IV tube (transfusions).  Infection.  Allergic reactions to medicines or dyes.  Pain at the surgical site.  Damage to organs or other parts of the body.  Short-term memory loss, confusion, and personality changes.  Heart rhythm problems (arrhythmias).  Stroke.  Heart attack during or after surgery.  Kidney failure. What happens before the procedure? Staying hydrated Follow instructions from your doctor about hydration. These may include:  Up to 2 hours before the procedure - you may continue to drink clear liquids, such as: ? Water. ? Clear fruit juice. ? Black coffee. ? Plain tea.  Eating and drinking Follow instructions from your doctor about eating and drinking. These may include:  8 hours before the procedure - stop eating heavy meals or foods, such as: ? Meat. ? Fried foods. ? Fatty foods.  6 hours before the procedure - stop eating light  meals or foods, such as: ? Toast. ? Cereal.  6 hours before the procedure - stop drinking milk or drinks that contain milk.  2 hours before the procedure - stop drinking clear liquids. Medicines  Take over-the-counter and prescription medicines only as told by your doctor.  Ask your doctor about: ? Changing or stopping your normal medicines. This is important. ? Taking aspirin and ibuprofen. Do not take these medicines unless your doctor tells you to take them. ? Taking over-the-counter medicines, vitamins, herbs, and supplements. General instructions  Ask your doctor: ? How your surgery site will be marked. ? What steps will be taken to help prevent the spread of germs. These may include:  Removing hair at the surgery site.  Washing skin with a germ-killing soap.  Taking antibiotic medicine.  You may be asked to shower with a germ-killing soap.  For 3-6 weeks before the CABG, do not use any products that contain nicotine or tobacco. These include cigarettes, e-cigarettes, and chewing tobacco. Quitting smoking is one of the best things you can do for your heart health. If you need help quitting, ask your doctor.  Talk with your doctor about where the grafts will be taken from for your surgery. What happens during the procedure?  An IV tube will be placed into one of your veins.  You will be given one or more of the following: ? A medicine to help you relax (sedative). ? A medicine to make you fall asleep (general anesthetic).  A cut (incision) will be made down the front of the  chest through the breastbone (sternum).  The breastbone will be opened so your heart can be seen.  You may or may not be placed on a heart-lung bypass machine. ? If this machine is used, your heart will be briefly stopped. ? This machine will give oxygen to your blood while your heart is being worked on.  A section of blood vessel will be removed from another part of your body (often the chest,  arm, or leg).  The blood vessel will be attached above and below the blocked artery of your heart. This may be done on more than one artery of the heart.  You will be taken off the heart-lung machine if it was used.  If your heart was stopped, it will be restarted.  Your chest will be closed with special wire that will hold your bones together as they heal.  Your cuts will be closed with stitches (sutures), skin glue, or skin tape (adhesive) strips.  A bandage (dressing) will be placed over the cuts.  Tubes will stay in your chest. They will be connected to a device that will help drain fluid and reinflate the lungs. The procedure may vary among doctors and hospitals. What happens after the procedure?  You will be monitored until you leave the hospital. This includes checking your blood pressure, heart rate, breathing rate, and blood oxygen level.  You may wake up with a tube in your throat. This tube will help you breathe. You may be connected to a breathing machine. You will not be able to talk when the tube is in. The tube will be taken out when it is safe.  You will be groggy and may have some pain. You will be given medicine to help the pain.  You may be in the intensive care unit for 1-2 days.  You may be given oxygen to help you breathe.  You will be shown how to do deep breathing exercises.  You may have to wear compression stockings. These stockings help to prevent blood clots and reduce swelling in your legs.  You may be given new medicines to take.  Cardiac rehab will be started while you are in the hospital. This may include education and exercises to help you recover from your surgery. Summary  During CABG, a section of blood vessel from another part of the body is taken out. It is then placed where there is narrowing or blockage.  For 3-6 weeks before the procedure, do not use any products that contain nicotine or tobacco. Quitting smoking is one of the best  things you can do for your heart health. If you need help quitting, ask your doctor.  You may wake up with a tube in your throat. This tube will help you breathe. You will not be able to talk when the tube is in. The tube will be taken out when it is safe. This information is not intended to replace advice given to you by your health care provider. Make sure you discuss any questions you have with your health care provider. Document Released: 02/13/2013 Document Revised: 10/18/2017 Document Reviewed: 10/18/2017 Elsevier Patient Education  2020 [employer].    Coronary Artery Disease, Male Coronary artery disease (CAD) is a condition in which the arteries that lead to the heart (coronary arteries) become narrow or blocked. The narrowing or blockage can lead to decreased blood flow to the heart. Prolonged reduced blood flow can cause a heart attack (myocardial infarction or MI). This condition may  also be called coronary heart disease. Because CAD is the leading cause of death in men, it is important to understand what causes this condition and how it is treated. What are the causes? CAD is most often caused by atherosclerosis. This is the buildup of fat and cholesterol (plaque) on the inside of the arteries. Over time, the plaque may narrow or block the artery, reducing blood flow to the heart. Plaque can also become weak and break off within a coronary artery and cause a sudden blockage. Other less common causes of CAD include:  A blood clot or a piece of a blood clot or other substance that blocks the flow of blood in a coronary artery (embolism).  A tearing of the artery (spontaneous coronary artery dissection).  An enlargement of an artery (aneurysm).  Inflammation (vasculitis) in the artery wall. What increases the risk? The following factors may make you more likely to develop this condition:  Age. Men over age 51 are at a greater risk of CAD.  Family history of CAD.  Gender. Men  often develop CAD earlier in life than women.  High blood pressure (hypertension).  Diabetes.  High cholesterol levels.  Tobacco use.  Excessive alcohol use.  Lack of exercise.  A diet high in saturated and trans fats, such as fried food and processed meat. Other possible risk factors include:  High stress levels.  Depression.  Obesity.  Sleep apnea. What are the signs or symptoms? Many people do not have any symptoms during the early stages of CAD. As the condition progresses, symptoms may include:  Chest pain (angina). The pain can: ? Feel like crushing or squeezing, or like a tightness, pressure, fullness, or heaviness in the chest. ? Last more than a few minutes or can stop and recur. The pain tends to get worse with exercise or stress and to fade with rest.  Pain in the arms, neck, jaw, ear, or back.  Unexplained heartburn or indigestion.  Shortness of breath.  Nausea or vomiting.  Sudden light-headedness.  Sudden cold sweats.  Fluttering or fast heartbeat (palpitations). How is this diagnosed? This condition is diagnosed based on:  Your family and medical history.  A physical exam.  Tests, including: ? A test to check the electrical signals in your heart (electrocardiogram). ? Exercise stress test. This looks for signs of blockage when the heart is stressed with exercise, such as running on a treadmill. ? Pharmacologic stress test. This test looks for signs of blockage when the heart is being stressed with a medicine. ? Blood tests. ? Coronary angiogram. This is a procedure to look at the coronary arteries to see if there is any blockage. During this test, a dye is injected into your arteries so they appear on an X-ray. ? Coronary artery CT scan. This CT scan helps detect calcium deposits in your coronary arteries. Calcium deposits are an indicator of CAD. ? A test that uses sound waves to take a picture of your heart (echocardiogram). ? Chest  X-ray. How is this treated? This condition may be treated by:  Healthy lifestyle changes to reduce risk factors.  Medicines such as: ? Antiplatelet medicines and blood-thinning medicines, such as aspirin. These help to prevent blood clots. ? Nitroglycerin. ? Blood pressure medicines. ? Cholesterol-lowering medicine.  Coronary angioplasty and stenting. During this procedure, a thin, flexible tube is inserted through a blood vessel and into a blocked artery. A balloon or similar device on the end of the tube is inflated to  open up the artery. In some cases, a small, mesh tube (stent) is inserted into the artery to keep it open.  Coronary artery bypass surgery. During this surgery, veins or arteries from other parts of the body are used to create a bypass around the blockage and allow blood to reach your heart. Follow these instructions at home: Medicines  Take over-the-counter and prescription medicines only as told by your health care provider.  Do not take the following medicines unless your health care provider approves: ? NSAIDs, such as ibuprofen, naproxen, or celecoxib. ? Vitamin supplements that contain vitamin A, vitamin E, or both. Lifestyle  Follow an exercise program approved by your health care provider. Aim for 150 minutes of moderate exercise or 75 minutes of vigorous exercise each week.  Maintain a healthy weight or lose weight as approved by your health care provider.  Learn to manage stress or try to limit your stress. Ask your health care provider for suggestions if you need help.  Get screened for depression and seek treatment, if needed.  Do not use any products that contain nicotine or tobacco, such as cigarettes, e-cigarettes, and chewing tobacco. If you need help quitting, ask your health care provider.  Do not use illegal drugs. Eating and drinking   Follow a heart-healthy diet. A dietitian can help educate you about healthy food options and changes. In  general, eat plenty of fruits and vegetables, lean meats, and whole grains.  Avoid foods high in: ? Sugar. ? Salt (sodium). ? Saturated fat, such as processed or fatty meat. ? Trans fat, such as fried foods.  Use healthy cooking methods such as roasting, grilling, broiling, baking, poaching, steaming, or stir-frying.  Do not drink alcohol if your health care provider tells you not to drink.  If you drink alcohol: ? Limit how much you have to 0-2 drinks per day. ? Be aware of how much alcohol is in your drink. In the [place] one drink equals one 12 oz bottle of beer (355 mL), one 5 oz glass of wine (148 mL), or one 1 oz glass of hard liquor (44 mL). General instructions  Manage any other health conditions, such as hypertension and diabetes. These conditions affect your heart.  Your health care provider may ask you to monitor your blood pressure. Ideally, your blood pressure should be below 130/80.  Keep all follow-up visits as told by your health care provider. This is important. Get help right away if:  You have pain in your chest, neck, ear, arm, jaw, stomach, or back that: ? Lasts more than a few minutes. ? Is recurring. ? Is not relieved by taking medicine under your tongue (sublingual nitroglycerin).  You have profuse sweating without cause.  You have unexplained: ? Heartburn or indigestion. ? Shortness of breath or difficulty breathing. ? Fluttering or fast heartbeat (palpitations). ? Nausea or vomiting. ? Fatigue. ? Feelings of nervousness or anxiety. ? Weakness. ? Diarrhea.  You have sudden light-headedness or dizziness.  You faint.  You feel like hurting yourself or think about taking your own life. These symptoms may represent a serious problem that is an emergency. Do not wait to see if the symptoms will go away. Get medical help right away. Call your local emergency services (911 in the [place] Do not drive yourself to the hospital. Summary  Coronary artery  disease (CAD) is a condition in which the arteries that lead to the heart (coronary arteries) become narrow or blocked. The narrowing or blockage can  lead to a heart attack.  Many people do not have any symptoms during the early stages of CAD.  CAD can be treated with lifestyle changes, medicines, surgery, or a combination of these treatments. This information is not intended to replace advice given to you by your health care provider. Make sure you discuss any questions you have with your health care provider. Document Released: 09/05/2013 Document Revised: 10/28/2017 Document Reviewed: 10/18/2017 Elsevier Patient Education  2020 [place]  This sheet gives you information about how to care for yourself after your procedure. Your health care provider may also give you more specific instructions. If you have problems or questions, contact your health care provider. What can I expect after the procedure? After the procedure, it is common to have:  Bruising and tenderness at the catheter insertion area. Follow these instructions at home: Medicines  Take over-the-counter and prescription medicines only as told by your health care provider. Insertion site care  Follow instructions from your health care provider about how to take care of your insertion site. Make sure you: ? Wash your hands with soap and water before you change your bandage (dressing). If soap and water are not available, use hand sanitizer. ? Change your dressing as told by your health care provider. ? Leave stitches (sutures), skin glue, or adhesive strips in place. These skin closures may need to stay in place for 2 weeks or longer. If adhesive strip edges start to loosen and curl up, you may trim the loose edges. Do not remove adhesive strips completely unless your health care provider tells you to do that.  Check your insertion site every day for signs of infection. Check for: ? Redness,  swelling, or pain. ? Fluid or blood. ? Pus or a bad smell. ? Warmth.  Do not take baths, swim, or use a hot tub until your health care provider approves.  You may shower 24-48 hours after the procedure, or as directed by your health care provider. ? Remove the dressing and gently wash the site with plain soap and water. ? Pat the area dry with a clean towel. ? Do not rub the site. That could cause bleeding.  Do not apply powder or lotion to the site. Activity   For 24 hours after the procedure, or as directed by your health care provider: ? Do not flex or bend the affected arm. ? Do not push or pull heavy objects with the affected arm. ? Do not drive yourself home from the hospital or clinic. You may drive 24 hours after the procedure unless your health care provider tells you not to. ? Do not operate machinery or power tools.  Do not lift anything that is heavier than 10 lb (4.5 kg), or the limit that you are told, until your health care provider says that it is safe.  Ask your health care provider when it is okay to: ? Return to work or school. ? Resume usual physical activities or sports. ? Resume sexual activity. General instructions  If the catheter site starts to bleed, raise your arm and put firm pressure on the site. If the bleeding does not stop, get help right away. This is a medical emergency.  If you went home on the same day as your procedure, a responsible adult should be with you for the first 24 hours after you arrive home.  Keep all follow-up visits as told by your health care provider. This is important.  Contact a health care provider if:  You have a fever.  You have redness, swelling, or yellow drainage around your insertion site. Get help right away if:  You have unusual pain at the radial site.  The catheter insertion area swells very fast.  The insertion area is bleeding, and the bleeding does not stop when you hold steady pressure on the  area.  Your arm or hand becomes pale, cool, tingly, or numb. These symptoms may represent a serious problem that is an emergency. Do not wait to see if the symptoms will go away. Get medical help right away. Call your local emergency services (911 in the [place] Do not drive yourself to the hospital. Summary  After the procedure, it is common to have bruising and tenderness at the site.  Follow instructions from your health care provider about how to take care of your radial site wound. Check the wound every day for signs of infection.  Do not lift anything that is heavier than 10 lb (4.5 kg), or the limit that you are told, until your health care provider says that it is safe. This information is not intended to replace advice given to you by your health care provider. Make sure you discuss any questions you have with your health care provider. Document Released: 03/13/2010 Document Revised: 03/16/2017 Document Reviewed: 03/16/2017 Elsevier Patient Education  2020 [employer].

## 2018-12-21 ENCOUNTER — Institutional Professional Consult (permissible substitution): Payer: Medicaid Other | Admitting: Cardiothoracic Surgery

## 2018-12-21 ENCOUNTER — Other Ambulatory Visit: Payer: Self-pay | Admitting: *Deleted

## 2018-12-21 VITALS — BP 149/92 | HR 79 | Temp 97.7°F | Resp 16 | Ht 63.0 in | Wt 135.2 lb

## 2018-12-21 DIAGNOSIS — I2510 Atherosclerotic heart disease of native coronary artery without angina pectoris: Secondary | ICD-10-CM

## 2018-12-21 DIAGNOSIS — I25110 Atherosclerotic heart disease of native coronary artery with unstable angina pectoris: Secondary | ICD-10-CM

## 2018-12-21 MED ORDER — NICOTINE 14 MG/24HR TD PT24: 14.0000 mg | MEDICATED_PATCH | TRANSDERMAL | 0 refills | Status: DC

## 2018-12-21 NOTE — Progress Notes (Signed)
[address]Suite [address]       [place] [address]             [phone]     CARDIOTHORACIC SURGERY CONSULTATION REPORT  Referring Provider is Isaias Cowman, MD Primary Cardiologist is No primary care provider on file. PCP is Volney American, PA-C  Chief Complaint  Patient presents with  . Coronary Artery Disease    CABG evaluation    HPI:  63 yo man with PMHx of bladder CA was to undergo repeat bladder procedure when pre-op cardiac clearance was requested due to report of frequent chest pain, typically occurring with exertion but occasionally at rest. He had a + stress test which was followed by [facility] yesterday in [place], demonstrating severe 3V CAD. Referred for surgical consultation. Denies current chest pain. Is a recent and long-term smoker, quit approximately one week ago. Occasional shortness of breath with heavy exertion; continues to work.   Past Medical History:  Diagnosis Date  . Bladder tumor   . Cancer [facility])    Bladder  . Chronic kidney disease    renal insufficiency  . Hypertension     Past Surgical History:  Procedure Laterality Date  . LEFT HEART CATH AND CORONARY ANGIOGRAPHY Left 12/20/2018   Procedure: LEFT HEART CATH AND CORONARY ANGIOGRAPHY;  Surgeon: Isaias Cowman, MD;  Location: [facility];  Service: Cardiovascular;  Laterality: Left;  . TUMOR REMOVAL  2019   Bladder    Family History  Family history unknown: Yes    Social History   Socioeconomic History  . Marital status: Single    Spouse name: Not on file  . Number of children: Not on file  . Years of education: Not on file  . Highest education level: Not on file  Occupational History  . Not on file  Social Needs  . Financial resource strain: Not on file  . Food insecurity    Worry: Not on file    Inability: Not on file  . Transportation needs    Medical: Not on file    Non-medical: Not on file  Tobacco Use  . Smoking status:  Current Every Day Smoker    Packs/day: 0.50    Types: Cigarettes    Last attempt to quit: 03/25/2018    Years since quitting: 0.7  . Smokeless tobacco: Never Used  Substance and Sexual Activity  . Alcohol use: Never    Frequency: Never  . Drug use: Never  . Sexual activity: Yes  Lifestyle  . Physical activity    Days per week: Not on file    Minutes per session: Not on file  . Stress: Not on file  Relationships  . Social Herbalist on phone: Not on file    Gets together: Not on file    Attends religious service: Not on file    Active member of club or organization: Not on file    Attends meetings of clubs or organizations: Not on file    Relationship status: Not on file  . Intimate partner violence    Fear of current or ex partner: Not on file    Emotionally abused: Not on file    Physically abused: Not on file    Forced sexual activity: Not on file  Other Topics Concern  . Not on file  Social History Narrative  . Not on file    Current Outpatient Medications  Medication Sig Dispense Refill  .  amLODipine (NORVASC) 10 MG tablet Take 1 tablet (10 mg total) by mouth daily. 90 tablet 1  . isosorbide mononitrate (IMDUR) 60 MG 24 hr tablet Take 60 mg by mouth 2 (two) times daily.     . metoprolol succinate (TOPROL-XL) 25 MG 24 hr tablet Take 1 tablet (25 mg total) by mouth daily. 90 tablet 1  . nicotine (NICODERM CQ - DOSED IN MG/24 HOURS) 14 mg/24hr patch Place 1 patch (14 mg total) onto the skin daily. 30 patch 0  . nicotine (NICODERM CQ - DOSED IN MG/24 HOURS) 21 mg/24hr patch Place 1 patch (21 mg total) onto the skin daily. (Patient not taking: Reported on 12/19/2018) 28 patch 0  . sulfamethoxazole-trimethoprim (BACTRIM DS) 800-160 MG tablet Take 1 tablet by mouth every 12 (twelve) hours. (Patient not taking: Reported on 12/20/2018) 10 tablet 0   Current Facility-Administered Medications  Medication Dose Route Frequency Provider Last Rate Last Dose  . lidocaine  (XYLOCAINE) 2 % jelly 1 application  1 application Urethral Once Stoioff, Scott C, MD        No Known Allergies    Review of Systems:   General:  Decreased appetite, +weight loss,   Cardiac:  Per HPI  Respiratory:  Per HPI  GI:   neg  GU:   Bladder cancer s/p TURBT; h/o bilateral nephrostomy tubes; baseline Cr 2.5  Vascular:  neg  Neuro:   neg  Musculoskeletal: Denies arthritis  Skin:   neg  Psych:   neg  Eyes:   neg  ENT:   neg  Hematologic:  neg  Endocrine:  no diabetes, does not check CBG's at home     Physical Exam:   BP (!) 149/92 (BP Location: Left Arm)   Pulse 79   Temp 97.7 F (36.5 C) (Skin)   Resp 16   Ht 5\' 3"  (1.6 m)   Wt 61.3 kg   SpO2 94%   BMI 23.95 kg/m   General:   well-appearing  HEENT:  Unremarkable   Neck:   no JVD, no bruits, no adenopathy   Chest:   clear to auscultation, symmetrical breath sounds, no wheezes, no rhonchi   CV:   RRR, no murmur   Abdomen:  soft, non-tender, no masses  Extremities:  warm, well-perfused, pulses intact throughout, no LE edema  Rectal/GU  Deferred  Neuro:   Grossly non-focal and symmetrical throughout  Skin:   Clean and dry, no rashes, no breakdown   Diagnostic Tests:  [facility]HC personally reviewed and agree with interpretation   Impression:  62 yo man with daily angina and 3V CAD by [facility] yesterday   Plan:  For CABG next week.    I spent in excess of 45 minutes during the conduct of this office consultation and >50% of this time involved direct face-to-face encounter with the patient for counseling and/or coordination of their care.          Level 3 Office Consult = 40 minutes         Level 4 Office Consult = 60 minutes         Level 5 Office Consult = 80 minutes  B. Murvin Natal, MD 12/21/2018 2:56 PM

## 2018-12-25 ENCOUNTER — Other Ambulatory Visit (HOSPITAL_COMMUNITY): Payer: Medicaid Other

## 2018-12-25 ENCOUNTER — Ambulatory Visit (HOSPITAL_COMMUNITY)
Admission: RE | Admit: 2018-12-25 | Discharge: 2018-12-25 | Disposition: A | Discharge status: Home / Self Care | Payer: Medicaid Other | Source: Ambulatory Visit | Attending: Cardiothoracic Surgery | Admitting: Cardiothoracic Surgery

## 2018-12-25 ENCOUNTER — Encounter (HOSPITAL_COMMUNITY): Payer: Self-pay

## 2018-12-25 ENCOUNTER — Other Ambulatory Visit: Payer: Self-pay

## 2018-12-25 ENCOUNTER — Encounter (HOSPITAL_COMMUNITY)
Admission: RE | Admit: 2018-12-25 | Discharge: 2018-12-25 | Disposition: A | Discharge status: Home / Self Care | Payer: Medicaid Other | Source: Ambulatory Visit | Attending: Cardiothoracic Surgery | Admitting: Cardiothoracic Surgery

## 2018-12-25 ENCOUNTER — Other Ambulatory Visit (HOSPITAL_COMMUNITY)
Admission: RE | Admit: 2018-12-25 | Discharge: 2018-12-25 | Disposition: A | Discharge status: Home / Self Care | Payer: Medicaid Other | Source: Ambulatory Visit | Attending: Cardiothoracic Surgery | Admitting: Cardiothoracic Surgery

## 2018-12-25 DIAGNOSIS — I2510 Atherosclerotic heart disease of native coronary artery without angina pectoris: Secondary | ICD-10-CM

## 2018-12-25 DIAGNOSIS — Z20828 Contact with and (suspected) exposure to other viral communicable diseases: Secondary | ICD-10-CM | POA: Insufficient documentation

## 2018-12-25 DIAGNOSIS — Z01818 Encounter for other preprocedural examination: Secondary | ICD-10-CM | POA: Insufficient documentation

## 2018-12-25 HISTORY — DX: Acute myocardial infarction, unspecified: I21.9

## 2018-12-25 HISTORY — DX: Presence of spectacles and contact lenses: Z97.3

## 2018-12-25 HISTORY — DX: Atherosclerotic heart disease of native coronary artery without angina pectoris: I25.10

## 2018-12-25 LAB — COMPREHENSIVE METABOLIC PANEL
ALT: 23 U/L (ref 0–44)
AST: 17 U/L (ref 15–41)
Albumin: 3.2 g/dL — ABNORMAL LOW (ref 3.5–5.0)
Alkaline Phosphatase: 64 U/L (ref 38–126)
Anion gap: 9 (ref 5–15)
BUN: 57 mg/dL — ABNORMAL HIGH (ref 6–20)
CO2: 14 mmol/L — ABNORMAL LOW (ref 22–32)
Calcium: 7.6 mg/dL — ABNORMAL LOW (ref 8.9–10.3)
Chloride: 117 mmol/L — ABNORMAL HIGH (ref 98–111)
Creatinine, Ser: 2.9 mg/dL — ABNORMAL HIGH (ref 0.61–1.24)
GFR calc Af Amer: 26 mL/min — ABNORMAL LOW (ref >60)
GFR calc non Af Amer: 22 mL/min — ABNORMAL LOW (ref >60)
Glucose, Bld: 142 mg/dL — ABNORMAL HIGH (ref 70–99)
Potassium: 5.2 mmol/L — ABNORMAL HIGH (ref 3.5–5.1)
Sodium: 140 mmol/L (ref 135–145)
Total Bilirubin: 0.3 mg/dL (ref 0.3–1.2)
Total Protein: 6 g/dL — ABNORMAL LOW (ref 6.5–8.1)

## 2018-12-25 LAB — HEMOGLOBIN A1C
Hgb A1c MFr Bld: 6 % — ABNORMAL HIGH (ref 4.8–5.6)
Mean Plasma Glucose: 125.5 mg/dL

## 2018-12-25 LAB — SARS CORONAVIRUS 2 (TAT 6-24 HRS): SARS Coronavirus 2: NEGATIVE

## 2018-12-25 LAB — BLOOD GAS, ARTERIAL
Acid-base deficit: 8.5 mmol/L — ABNORMAL HIGH (ref 0.0–2.0)
Bicarbonate: 16 mmol/L — ABNORMAL LOW (ref 20.0–28.0)
Drawn by: 421801
FIO2: 21
O2 Saturation: 98 %
Patient temperature: 37
pCO2 arterial: 30 mmHg — ABNORMAL LOW (ref 32.0–48.0)
pH, Arterial: 7.348 — ABNORMAL LOW (ref 7.350–7.450)
pO2, Arterial: 142 mmHg — ABNORMAL HIGH (ref 83.0–108.0)

## 2018-12-25 LAB — URINALYSIS, ROUTINE W REFLEX MICROSCOPIC
Bilirubin Urine: NEGATIVE
Glucose, UA: NEGATIVE mg/dL
Ketones, ur: NEGATIVE mg/dL
Nitrite: POSITIVE — AB
Protein, ur: 100 mg/dL — AB
Specific Gravity, Urine: 1.01 (ref 1.005–1.030)
WBC, UA: >50 WBC/hpf — ABNORMAL HIGH (ref 0–5)
pH: 5 (ref 5.0–8.0)

## 2018-12-25 LAB — CBC
HCT: 35.5 % — ABNORMAL LOW (ref 39.0–52.0)
Hemoglobin: 10.4 g/dL — ABNORMAL LOW (ref 13.0–17.0)
MCH: 27.7 pg (ref 26.0–34.0)
MCHC: 29.3 g/dL — ABNORMAL LOW (ref 30.0–36.0)
MCV: 94.4 fL (ref 80.0–100.0)
Platelets: 294 10*3/uL (ref 150–400)
RBC: 3.76 MIL/uL — ABNORMAL LOW (ref 4.22–5.81)
RDW: 15.9 % — ABNORMAL HIGH (ref 11.5–15.5)
WBC: 7.5 10*3/uL (ref 4.0–10.5)
nRBC: 0 % (ref 0.0–0.2)

## 2018-12-25 LAB — ABO/RH: ABO/RH(D): A POS

## 2018-12-25 LAB — PROTIME-INR
INR: 1 (ref 0.8–1.2)
Prothrombin Time: 12.9 seconds (ref 11.4–15.2)

## 2018-12-25 LAB — APTT: aPTT: 28 seconds (ref 24–36)

## 2018-12-25 LAB — SURGICAL PCR SCREEN
MRSA, PCR: NEGATIVE
Staphylococcus aureus: NEGATIVE

## 2018-12-25 NOTE — Progress Notes (Signed)
Pt denies any acute cardiopulmonary issues. Pt stated that he is under the care of Dr. Isaias Cowman, Cardiology and Merrie Roof, [place]. Pt denies having an EKG in the last month and a chest x ray in the last 2 weeks. Pt denies having recent labs. Pt verbalized understanding of all pre-op instructions. Nurse spoke with Henrine Screws RN, to make MD aware to review pt abnormal labs ( UA, CMP). Pt verbalized understanding of all pre-op instructions. Pt chart forwarded to PA, Anesthesiology, for review.

## 2018-12-25 NOTE — Progress Notes (Signed)
Pre op vascular       has been completed. Preliminary results can be found under CV proc through chart review. June Leap, BS, RDMS, RVT

## 2018-12-25 NOTE — Pre-Procedure Instructions (Signed)
   [place]  12/25/2018    Anthony [facility] [address]), [place] [address] [place]) [place] [address] Phone: [phone] Fax: [phone]   Your procedure is scheduled on Wednesday, December 27, 2018  Report to [facility] Admitting at 6:30 A.M.  Call this number if you have problems the morning of surgery:  [phone]   Remember:  Do not eat or drink after midnight Tuesday, December 26, 2018   Take these medicines the morning of surgery with A SIP OF WATER : amLODipine (NORVASC), isosorbide mononitrate (IMDUR), metoprolol succinate (TOPROL-XL)  Stop taking  vitamins, fish oil and herbal medications. Do not take any NSAIDs ie: Ibuprofen, Advil, Naproxen (Aleve), Motrin, BC and Goody Powder; stop now.   Do not wear jewelry, make-up or nail polish.  Do not wear lotions, powders, or perfumes, or deodorant.  Do not shave 48 hours prior to surgery.  Men may shave face and neck.  Do not bring valuables to the hospital.  [facility] is not responsible for any belongings or valuables.  Contacts, dentures or bridgework may not be worn into surgery.  Leave your suitcase in the car.  After surgery it may be brought to your room.  For patients admitted to the hospital, discharge time will be determined by your treatment team. Special instructions: See " [facility] Preparing For Surgery " sheet.  Please read over the following fact sheets that you were given. Pain Booklet, Coughing and Deep Breathing, MRSA Information and Surgical Site Infection Prevention

## 2018-12-26 ENCOUNTER — Encounter (HOSPITAL_COMMUNITY): Payer: Self-pay | Admitting: Anesthesiology

## 2018-12-26 MED ORDER — DOPAMINE-DEXTROSE 3.2-5 MG/ML-% IV SOLN
0.0000 ug/kg/min | INTRAVENOUS | Status: DC
  Filled 2018-12-26: qty 250

## 2018-12-26 MED ORDER — VANCOMYCIN HCL 10 G IV SOLR
1250.0000 mg | INTRAVENOUS | Status: AC
  Administered 2018-12-27: 1250 mg via INTRAVENOUS
  Filled 2018-12-26: qty 1250

## 2018-12-26 MED ORDER — INSULIN REGULAR(HUMAN) IN NACL 100-0.9 UT/100ML-% IV SOLN
INTRAVENOUS | Status: AC
  Administered 2018-12-27: 1.2 [IU]/h via INTRAVENOUS
  Filled 2018-12-26: qty 100

## 2018-12-26 MED ORDER — SODIUM CHLORIDE 0.9 % IV SOLN
INTRAVENOUS | Status: DC
  Filled 2018-12-26: qty 30

## 2018-12-26 MED ORDER — TRANEXAMIC ACID 1000 MG/10ML IV SOLN
1.5000 mg/kg/h | INTRAVENOUS | Status: AC
  Administered 2018-12-27: 1.5 mg/kg/h via INTRAVENOUS
  Filled 2018-12-26: qty 25

## 2018-12-26 MED ORDER — MAGNESIUM SULFATE 50 % IJ SOLN
40.0000 meq | INTRAMUSCULAR | Status: DC
  Filled 2018-12-26: qty 9.85

## 2018-12-26 MED ORDER — TRANEXAMIC ACID (OHS) BOLUS VIA INFUSION
15.0000 mg/kg | INTRAVENOUS | Status: AC
  Administered 2018-12-27: 10:00:00 925.5 mg via INTRAVENOUS
  Filled 2018-12-26: qty 926

## 2018-12-26 MED ORDER — PLASMA-LYTE 148 IV SOLN
INTRAVENOUS | Status: DC
  Filled 2018-12-26: qty 2.5

## 2018-12-26 MED ORDER — SODIUM CHLORIDE 0.9 % IV SOLN
1.5000 g | INTRAVENOUS | Status: AC
  Administered 2018-12-27: 1.5 g via INTRAVENOUS
  Filled 2018-12-26: qty 1.5

## 2018-12-26 MED ORDER — EPINEPHRINE HCL 5 MG/250ML IV SOLN IN NS
0.0000 ug/min | INTRAVENOUS | Status: DC
  Filled 2018-12-26: qty 250

## 2018-12-26 MED ORDER — SODIUM CHLORIDE 0.9 % IV SOLN
750.0000 mg | INTRAVENOUS | Status: AC
  Administered 2018-12-27: 750 mg via INTRAVENOUS
  Filled 2018-12-26: qty 750

## 2018-12-26 MED ORDER — VANCOMYCIN HCL 1000 MG IV SOLR
INTRAVENOUS | Status: DC
  Filled 2018-12-26: qty 1000

## 2018-12-26 MED ORDER — POTASSIUM CHLORIDE 2 MEQ/ML IV SOLN
80.0000 meq | INTRAVENOUS | Status: DC
  Filled 2018-12-26: qty 40

## 2018-12-26 MED ORDER — NITROGLYCERIN IN D5W 200-5 MCG/ML-% IV SOLN
2.0000 ug/min | INTRAVENOUS | Status: AC
  Administered 2018-12-27: 16.6 ug/min via INTRAVENOUS
  Filled 2018-12-26: qty 250

## 2018-12-26 MED ORDER — DEXMEDETOMIDINE HCL IN NACL 400 MCG/100ML IV SOLN
0.1000 ug/kg/h | INTRAVENOUS | Status: AC
  Administered 2018-12-27: 12:00:00 .5 ug/kg/h via INTRAVENOUS
  Filled 2018-12-26: qty 100

## 2018-12-26 MED ORDER — TRANEXAMIC ACID (OHS) PUMP PRIME SOLUTION
2.0000 mg/kg | INTRAVENOUS | Status: DC
  Filled 2018-12-26: qty 1.23

## 2018-12-26 MED ORDER — PHENYLEPHRINE HCL-NACL 20-0.9 MG/250ML-% IV SOLN
30.0000 ug/min | INTRAVENOUS | Status: AC
  Administered 2018-12-27: 30 ug/min via INTRAVENOUS
  Filled 2018-12-26: qty 250

## 2018-12-26 MED ORDER — MILRINONE LACTATE IN DEXTROSE 20-5 MG/100ML-% IV SOLN
0.3000 ug/kg/min | INTRAVENOUS | Status: DC
  Filled 2018-12-26: qty 100

## 2018-12-26 NOTE — Anesthesia Preprocedure Evaluation (Addendum)
Anesthesia Evaluation  Patient identified by MRN, date of birth, ID band Patient awake    Reviewed: Allergy & Precautions, NPO status , Patient's Chart, lab work & pertinent test results  Airway Mallampati: II  TM Distance: >3 FB Neck ROM: Full    Dental  (+) Poor Dentition, Missing, Loose, Dental Advisory Given   Pulmonary COPD, Current Smoker and Patient abstained from smoking.,    breath sounds clear to auscultation       Cardiovascular hypertension, Pt. on home beta blockers and Pt. on medications + CAD and + Past MI   Rhythm:Regular Rate:Normal     Neuro/Psych negative neurological ROS     GI/Hepatic negative GI ROS, Neg liver ROS,   Endo/Other  negative endocrine ROS  Renal/GU Renal Insufficiency and CRFRenal disease     Musculoskeletal negative musculoskeletal ROS (+)   Abdominal Normal abdominal exam  (+)   Peds  Hematology negative hematology ROS (+)   Anesthesia Other Findings   Reproductive/Obstetrics                          Echo: Left ventricular ejection fraction, by visual estimation, is 40 to 45%. The left ventricle has mildly decreased function. Left ventricular septal wall thickness was mildly increased. Mildly increased left ventricular posterior wall thickness. There is mildly increased left ventricular hypertrophy. 2. Ant/Apical/Septal Hypo. 3. Global right ventricle has normal systolic function.The right ventricular size is normal. No increase in right ventricular wall thickness. 4. Left atrial size was normal. 5. Right atrial size was normal. 6. The mitral valve is grossly normal. No evidence of mitral valve regurgitation. 7. The tricuspid valve is normal in structure. Tricuspid valve regurgitation is mild. 8. The aortic valve is grossly normal Aortic valve regurgitation was not visualized by color flow Doppler. Mild aortic valve sclerosis without stenosis. 9. The  pulmonic valve was grossly normal. Pulmonic valve regurgitation is not visualized by color flow Doppler. 10. The aortic root was not well visualized.  Anesthesia Physical Anesthesia Plan  ASA: IV  Anesthesia Plan: General   Post-op Pain Management:    Induction: Intravenous  PONV Risk Score and Plan: Ondansetron and Treatment may vary due to age or medical condition  Airway Management Planned: Oral ETT  Additional Equipment: Arterial line, CVP, PA Cath, TEE and Ultrasound Guidance Line Placement  Intra-op Plan:   Post-operative Plan: Post-operative intubation/ventilation  Informed Consent: I have reviewed the patients History and Physical, chart, labs and discussed the procedure including the risks, benefits and alternatives for the proposed anesthesia with the patient or authorized representative who has indicated his/her understanding and acceptance.     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 Now with chronic hydronephrosis and CKD IV. He was scheduled for urologic procedure, however preop eval significant for CV symptoms and abn EKG, referred to cardiology and found to have significant CAD. Baseline creatinine ~2.8. Creatinine 2.9 on preop labs. Urinalysis was also abnormal. These results were called to Dr. Orvan Seen' office by PAT nurse. )      Anesthesia Quick Evaluation

## 2018-12-27 ENCOUNTER — Encounter (HOSPITAL_COMMUNITY): Payer: Self-pay | Admitting: Certified Registered Nurse Anesthetist

## 2018-12-27 ENCOUNTER — Other Ambulatory Visit: Payer: Self-pay

## 2018-12-27 ENCOUNTER — Inpatient Hospital Stay (HOSPITAL_COMMUNITY): Payer: Medicaid Other | Admitting: Vascular Surgery

## 2018-12-27 ENCOUNTER — Inpatient Hospital Stay (HOSPITAL_COMMUNITY)
Admission: RE | Admit: 2018-12-27 | Discharge: 2019-01-05 | DRG: 236 | Disposition: A | Discharge status: Home Health | Payer: Medicaid Other | Attending: Cardiothoracic Surgery | Admitting: Cardiothoracic Surgery

## 2018-12-27 ENCOUNTER — Inpatient Hospital Stay (HOSPITAL_COMMUNITY): Payer: Medicaid Other

## 2018-12-27 ENCOUNTER — Inpatient Hospital Stay (HOSPITAL_COMMUNITY): Payer: Medicaid Other | Admitting: Anesthesiology

## 2018-12-27 ENCOUNTER — Inpatient Hospital Stay (HOSPITAL_COMMUNITY)
Admission: RE | Disposition: A | Discharge status: Home Health | Payer: Self-pay | Source: Home / Self Care | Attending: Cardiothoracic Surgery

## 2018-12-27 DIAGNOSIS — I4891 Unspecified atrial fibrillation: Secondary | ICD-10-CM | POA: Diagnosis not present

## 2018-12-27 DIAGNOSIS — I4892 Unspecified atrial flutter: Secondary | ICD-10-CM | POA: Diagnosis not present

## 2018-12-27 DIAGNOSIS — N179 Acute kidney failure, unspecified: Secondary | ICD-10-CM

## 2018-12-27 DIAGNOSIS — E8770 Fluid overload, unspecified: Secondary | ICD-10-CM | POA: Diagnosis not present

## 2018-12-27 DIAGNOSIS — Z951 Presence of aortocoronary bypass graft: Secondary | ICD-10-CM | POA: Insufficient documentation

## 2018-12-27 DIAGNOSIS — D62 Acute posthemorrhagic anemia: Secondary | ICD-10-CM | POA: Diagnosis not present

## 2018-12-27 DIAGNOSIS — Z8551 Personal history of malignant neoplasm of bladder: Secondary | ICD-10-CM | POA: Diagnosis not present

## 2018-12-27 DIAGNOSIS — Z23 Encounter for immunization: Secondary | ICD-10-CM | POA: Diagnosis not present

## 2018-12-27 DIAGNOSIS — R0789 Other chest pain: Secondary | ICD-10-CM | POA: Diagnosis present

## 2018-12-27 DIAGNOSIS — N184 Chronic kidney disease, stage 4 (severe): Secondary | ICD-10-CM | POA: Diagnosis not present

## 2018-12-27 DIAGNOSIS — F05 Delirium due to known physiological condition: Secondary | ICD-10-CM | POA: Diagnosis not present

## 2018-12-27 DIAGNOSIS — I2510 Atherosclerotic heart disease of native coronary artery without angina pectoris: Principal | ICD-10-CM

## 2018-12-27 DIAGNOSIS — N186 End stage renal disease: Secondary | ICD-10-CM

## 2018-12-27 DIAGNOSIS — J439 Emphysema, unspecified: Secondary | ICD-10-CM | POA: Diagnosis present

## 2018-12-27 DIAGNOSIS — I129 Hypertensive chronic kidney disease with stage 1 through stage 4 chronic kidney disease, or unspecified chronic kidney disease: Secondary | ICD-10-CM | POA: Diagnosis present

## 2018-12-27 DIAGNOSIS — I9581 Postprocedural hypotension: Secondary | ICD-10-CM | POA: Diagnosis not present

## 2018-12-27 DIAGNOSIS — Z9889 Other specified postprocedural states: Secondary | ICD-10-CM

## 2018-12-27 DIAGNOSIS — I482 Chronic atrial fibrillation, unspecified: Secondary | ICD-10-CM

## 2018-12-27 DIAGNOSIS — E875 Hyperkalemia: Secondary | ICD-10-CM | POA: Diagnosis not present

## 2018-12-27 DIAGNOSIS — I10 Essential (primary) hypertension: Secondary | ICD-10-CM | POA: Diagnosis not present

## 2018-12-27 DIAGNOSIS — F17210 Nicotine dependence, cigarettes, uncomplicated: Secondary | ICD-10-CM | POA: Diagnosis not present

## 2018-12-27 DIAGNOSIS — N189 Chronic kidney disease, unspecified: Secondary | ICD-10-CM

## 2018-12-27 HISTORY — PX: TEE WITHOUT CARDIOVERSION: SHX5443

## 2018-12-27 HISTORY — DX: Presence of aortocoronary bypass graft: Z95.1

## 2018-12-27 HISTORY — PX: CORONARY ARTERY BYPASS GRAFT: SHX141

## 2018-12-27 HISTORY — PX: RADIAL ARTERY HARVEST: SHX5067

## 2018-12-27 LAB — CBC
HCT: 27.2 % — ABNORMAL LOW (ref 39.0–52.0)
Hemoglobin: 8.2 g/dL — ABNORMAL LOW (ref 13.0–17.0)
MCH: 28 pg (ref 26.0–34.0)
MCHC: 30.1 g/dL (ref 30.0–36.0)
MCV: 92.8 fL (ref 80.0–100.0)
Platelets: 236 10*3/uL (ref 150–400)
RBC: 2.93 MIL/uL — ABNORMAL LOW (ref 4.22–5.81)
RDW: 15.8 % — ABNORMAL HIGH (ref 11.5–15.5)
WBC: 13.6 10*3/uL — ABNORMAL HIGH (ref 4.0–10.5)
nRBC: 0 % (ref 0.0–0.2)

## 2018-12-27 LAB — POCT I-STAT 7, (LYTES, BLD GAS, ICA,H+H)
Acid-base deficit: 6 mmol/L — ABNORMAL HIGH (ref 0.0–2.0)
Acid-base deficit: 5 mmol/L — ABNORMAL HIGH (ref 0.0–2.0)
Acid-base deficit: 6 mmol/L — ABNORMAL HIGH (ref 0.0–2.0)
Acid-base deficit: 3 mmol/L — ABNORMAL HIGH (ref 0.0–2.0)
Acid-base deficit: 3 mmol/L — ABNORMAL HIGH (ref 0.0–2.0)
Acid-base deficit: 5 mmol/L — ABNORMAL HIGH (ref 0.0–2.0)
Acid-base deficit: 3 mmol/L — ABNORMAL HIGH (ref 0.0–2.0)
Bicarbonate: 22.1 mmol/L (ref 20.0–28.0)
Bicarbonate: 23.6 mmol/L (ref 20.0–28.0)
Bicarbonate: 21.4 mmol/L (ref 20.0–28.0)
Bicarbonate: 23.7 mmol/L (ref 20.0–28.0)
Bicarbonate: 23.5 mmol/L (ref 20.0–28.0)
Bicarbonate: 20.6 mmol/L (ref 20.0–28.0)
Bicarbonate: 21.8 mmol/L (ref 20.0–28.0)
Calcium, Ion: 0.91 mmol/L — ABNORMAL LOW (ref 1.15–1.40)
Calcium, Ion: 1.17 mmol/L (ref 1.15–1.40)
Calcium, Ion: 1.09 mmol/L — ABNORMAL LOW (ref 1.15–1.40)
Calcium, Ion: 1.03 mmol/L — ABNORMAL LOW (ref 1.15–1.40)
Calcium, Ion: 0.99 mmol/L — ABNORMAL LOW (ref 1.15–1.40)
Calcium, Ion: 1.01 mmol/L — ABNORMAL LOW (ref 1.15–1.40)
Calcium, Ion: 1.01 mmol/L — ABNORMAL LOW (ref 1.15–1.40)
HCT: 26 % — ABNORMAL LOW (ref 39.0–52.0)
HCT: 24 % — ABNORMAL LOW (ref 39.0–52.0)
HCT: 26 % — ABNORMAL LOW (ref 39.0–52.0)
HCT: 27 % — ABNORMAL LOW (ref 39.0–52.0)
HCT: 24 % — ABNORMAL LOW (ref 39.0–52.0)
HCT: 25 % — ABNORMAL LOW (ref 39.0–52.0)
HCT: 23 % — ABNORMAL LOW (ref 39.0–52.0)
Hemoglobin: 8.8 g/dL — ABNORMAL LOW (ref 13.0–17.0)
Hemoglobin: 8.2 g/dL — ABNORMAL LOW (ref 13.0–17.0)
Hemoglobin: 8.8 g/dL — ABNORMAL LOW (ref 13.0–17.0)
Hemoglobin: 9.2 g/dL — ABNORMAL LOW (ref 13.0–17.0)
Hemoglobin: 8.2 g/dL — ABNORMAL LOW (ref 13.0–17.0)
Hemoglobin: 8.5 g/dL — ABNORMAL LOW (ref 13.0–17.0)
Hemoglobin: 7.8 g/dL — ABNORMAL LOW (ref 13.0–17.0)
O2 Saturation: 100 %
O2 Saturation: 100 %
O2 Saturation: 97 %
O2 Saturation: 95 %
O2 Saturation: 98 %
O2 Saturation: 98 %
O2 Saturation: 99 %
Patient temperature: 36.1
Patient temperature: 36.6
Patient temperature: 36.9
Patient temperature: 37.1
Patient temperature: 36.9
Potassium: 6.5 mmol/L (ref 3.5–5.1)
Potassium: 5.7 mmol/L — ABNORMAL HIGH (ref 3.5–5.1)
Potassium: 5.5 mmol/L — ABNORMAL HIGH (ref 3.5–5.1)
Potassium: 5.6 mmol/L — ABNORMAL HIGH (ref 3.5–5.1)
Potassium: 6.1 mmol/L — ABNORMAL HIGH (ref 3.5–5.1)
Potassium: 6.2 mmol/L — ABNORMAL HIGH (ref 3.5–5.1)
Potassium: 6 mmol/L — ABNORMAL HIGH (ref 3.5–5.1)
Sodium: 142 mmol/L (ref 135–145)
Sodium: 143 mmol/L (ref 135–145)
Sodium: 144 mmol/L (ref 135–145)
Sodium: 145 mmol/L (ref 135–145)
Sodium: 144 mmol/L (ref 135–145)
Sodium: 143 mmol/L (ref 135–145)
Sodium: 143 mmol/L (ref 135–145)
TCO2: 24 mmol/L (ref 22–32)
TCO2: 25 mmol/L (ref 22–32)
TCO2: 23 mmol/L (ref 22–32)
TCO2: 25 mmol/L (ref 22–32)
TCO2: 25 mmol/L (ref 22–32)
TCO2: 22 mmol/L (ref 22–32)
TCO2: 23 mmol/L (ref 22–32)
pCO2 arterial: 59.1 mmHg — ABNORMAL HIGH (ref 32.0–48.0)
pCO2 arterial: 63.5 mmHg — ABNORMAL HIGH (ref 32.0–48.0)
pCO2 arterial: 52.3 mmHg — ABNORMAL HIGH (ref 32.0–48.0)
pCO2 arterial: 51.3 mmHg — ABNORMAL HIGH (ref 32.0–48.0)
pCO2 arterial: 48.4 mmHg — ABNORMAL HIGH (ref 32.0–48.0)
pCO2 arterial: 39.4 mmHg (ref 32.0–48.0)
pCO2 arterial: 38.9 mmHg (ref 32.0–48.0)
pH, Arterial: 7.181 — CL (ref 7.350–7.450)
pH, Arterial: 7.178 — CL (ref 7.350–7.450)
pH, Arterial: 7.215 — ABNORMAL LOW (ref 7.350–7.450)
pH, Arterial: 7.27 — ABNORMAL LOW (ref 7.350–7.450)
pH, Arterial: 7.293 — ABNORMAL LOW (ref 7.350–7.450)
pH, Arterial: 7.325 — ABNORMAL LOW (ref 7.350–7.450)
pH, Arterial: 7.356 (ref 7.350–7.450)
pO2, Arterial: 406 mmHg — ABNORMAL HIGH (ref 83.0–108.0)
pO2, Arterial: 381 mmHg — ABNORMAL HIGH (ref 83.0–108.0)
pO2, Arterial: 110 mmHg — ABNORMAL HIGH (ref 83.0–108.0)
pO2, Arterial: 88 mmHg (ref 83.0–108.0)
pO2, Arterial: 118 mmHg — ABNORMAL HIGH (ref 83.0–108.0)
pO2, Arterial: 107 mmHg (ref 83.0–108.0)
pO2, Arterial: 131 mmHg — ABNORMAL HIGH (ref 83.0–108.0)

## 2018-12-27 LAB — POCT I-STAT, CHEM 8
BUN: 44 mg/dL — ABNORMAL HIGH (ref 6–20)
BUN: 40 mg/dL — ABNORMAL HIGH (ref 6–20)
BUN: 43 mg/dL — ABNORMAL HIGH (ref 6–20)
BUN: 46 mg/dL — ABNORMAL HIGH (ref 6–20)
BUN: 45 mg/dL — ABNORMAL HIGH (ref 6–20)
BUN: 42 mg/dL — ABNORMAL HIGH (ref 6–20)
BUN: 45 mg/dL — ABNORMAL HIGH (ref 6–20)
Calcium, Ion: 1.14 mmol/L — ABNORMAL LOW (ref 1.15–1.40)
Calcium, Ion: 0.91 mmol/L — ABNORMAL LOW (ref 1.15–1.40)
Calcium, Ion: 0.92 mmol/L — ABNORMAL LOW (ref 1.15–1.40)
Calcium, Ion: 1.12 mmol/L — ABNORMAL LOW (ref 1.15–1.40)
Calcium, Ion: 0.93 mmol/L — ABNORMAL LOW (ref 1.15–1.40)
Calcium, Ion: 0.93 mmol/L — ABNORMAL LOW (ref 1.15–1.40)
Calcium, Ion: 1.21 mmol/L (ref 1.15–1.40)
Chloride: 116 mmol/L — ABNORMAL HIGH (ref 98–111)
Chloride: 112 mmol/L — ABNORMAL HIGH (ref 98–111)
Chloride: 112 mmol/L — ABNORMAL HIGH (ref 98–111)
Chloride: 115 mmol/L — ABNORMAL HIGH (ref 98–111)
Chloride: 110 mmol/L (ref 98–111)
Chloride: 111 mmol/L (ref 98–111)
Chloride: 111 mmol/L (ref 98–111)
Creatinine, Ser: 2.8 mg/dL — ABNORMAL HIGH (ref 0.61–1.24)
Creatinine, Ser: 2.4 mg/dL — ABNORMAL HIGH (ref 0.61–1.24)
Creatinine, Ser: 2.7 mg/dL — ABNORMAL HIGH (ref 0.61–1.24)
Creatinine, Ser: 2.9 mg/dL — ABNORMAL HIGH (ref 0.61–1.24)
Creatinine, Ser: 2.8 mg/dL — ABNORMAL HIGH (ref 0.61–1.24)
Creatinine, Ser: 2.5 mg/dL — ABNORMAL HIGH (ref 0.61–1.24)
Creatinine, Ser: 2.8 mg/dL — ABNORMAL HIGH (ref 0.61–1.24)
Glucose, Bld: 89 mg/dL (ref 70–99)
Glucose, Bld: 110 mg/dL — ABNORMAL HIGH (ref 70–99)
Glucose, Bld: 119 mg/dL — ABNORMAL HIGH (ref 70–99)
Glucose, Bld: 162 mg/dL — ABNORMAL HIGH (ref 70–99)
Glucose, Bld: 153 mg/dL — ABNORMAL HIGH (ref 70–99)
Glucose, Bld: 133 mg/dL — ABNORMAL HIGH (ref 70–99)
Glucose, Bld: 75 mg/dL (ref 70–99)
HCT: 32 % — ABNORMAL LOW (ref 39.0–52.0)
HCT: 25 % — ABNORMAL LOW (ref 39.0–52.0)
HCT: 29 % — ABNORMAL LOW (ref 39.0–52.0)
HCT: 33 % — ABNORMAL LOW (ref 39.0–52.0)
HCT: 24 % — ABNORMAL LOW (ref 39.0–52.0)
HCT: 23 % — ABNORMAL LOW (ref 39.0–52.0)
HCT: 26 % — ABNORMAL LOW (ref 39.0–52.0)
Hemoglobin: 10.9 g/dL — ABNORMAL LOW (ref 13.0–17.0)
Hemoglobin: 11.2 g/dL — ABNORMAL LOW (ref 13.0–17.0)
Hemoglobin: 8.5 g/dL — ABNORMAL LOW (ref 13.0–17.0)
Hemoglobin: 9.9 g/dL — ABNORMAL LOW (ref 13.0–17.0)
Hemoglobin: 8.2 g/dL — ABNORMAL LOW (ref 13.0–17.0)
Hemoglobin: 7.8 g/dL — ABNORMAL LOW (ref 13.0–17.0)
Hemoglobin: 8.8 g/dL — ABNORMAL LOW (ref 13.0–17.0)
Potassium: 5.2 mmol/L — ABNORMAL HIGH (ref 3.5–5.1)
Potassium: 6.6 mmol/L (ref 3.5–5.1)
Potassium: 6.6 mmol/L (ref 3.5–5.1)
Potassium: 7.1 mmol/L (ref 3.5–5.1)
Potassium: 7.2 mmol/L (ref 3.5–5.1)
Potassium: 5.8 mmol/L — ABNORMAL HIGH (ref 3.5–5.1)
Potassium: 7.2 mmol/L (ref 3.5–5.1)
Sodium: 145 mmol/L (ref 135–145)
Sodium: 141 mmol/L (ref 135–145)
Sodium: 143 mmol/L (ref 135–145)
Sodium: 143 mmol/L (ref 135–145)
Sodium: 139 mmol/L (ref 135–145)
Sodium: 142 mmol/L (ref 135–145)
Sodium: 144 mmol/L (ref 135–145)
TCO2: 19 mmol/L — ABNORMAL LOW (ref 22–32)
TCO2: 19 mmol/L — ABNORMAL LOW (ref 22–32)
TCO2: 22 mmol/L (ref 22–32)
TCO2: 23 mmol/L (ref 22–32)
TCO2: 21 mmol/L — ABNORMAL LOW (ref 22–32)
TCO2: 22 mmol/L (ref 22–32)
TCO2: 23 mmol/L (ref 22–32)

## 2018-12-27 LAB — GLUCOSE, CAPILLARY
Glucose-Capillary: 50 mg/dL — ABNORMAL LOW (ref 70–99)
Glucose-Capillary: 78 mg/dL (ref 70–99)
Glucose-Capillary: 97 mg/dL (ref 70–99)
Glucose-Capillary: 107 mg/dL — ABNORMAL HIGH (ref 70–99)
Glucose-Capillary: 130 mg/dL — ABNORMAL HIGH (ref 70–99)
Glucose-Capillary: 143 mg/dL — ABNORMAL HIGH (ref 70–99)
Glucose-Capillary: 163 mg/dL — ABNORMAL HIGH (ref 70–99)

## 2018-12-27 LAB — PLATELET COUNT: Platelets: 203 10*3/uL (ref 150–400)

## 2018-12-27 LAB — HEMOGLOBIN AND HEMATOCRIT, BLOOD
HCT: 23.3 % — ABNORMAL LOW (ref 39.0–52.0)
Hemoglobin: 7.3 g/dL — ABNORMAL LOW (ref 13.0–17.0)

## 2018-12-27 LAB — ECHO INTRAOPERATIVE TEE

## 2018-12-27 LAB — APTT: aPTT: 29 seconds (ref 24–36)

## 2018-12-27 LAB — PREPARE RBC (CROSSMATCH)

## 2018-12-27 LAB — PROTIME-INR
INR: 1.3 — ABNORMAL HIGH (ref 0.8–1.2)
Prothrombin Time: 15.7 seconds — ABNORMAL HIGH (ref 11.4–15.2)

## 2018-12-27 SURGERY — CORONARY ARTERY BYPASS GRAFTING (CABG)
Anesthesia: General | Site: Chest

## 2018-12-27 MED ORDER — HEMOSTATIC AGENTS (NO CHARGE) OPTIME
TOPICAL | Status: DC | PRN
  Administered 2018-12-27: 1 via TOPICAL

## 2018-12-27 MED ORDER — ALBUMIN HUMAN 5 % IV SOLN
250.0000 mL | INTRAVENOUS | Status: AC | PRN
Start: 2018-12-27 — End: 2018-12-28
  Administered 2018-12-27 – 2018-12-28 (×3): 12.5 g via INTRAVENOUS
  Filled 2018-12-27: qty 250

## 2018-12-27 MED ORDER — PROTAMINE SULFATE 10 MG/ML IV SOLN
INTRAVENOUS | Status: DC | PRN
  Administered 2018-12-27: 250 mg via INTRAVENOUS

## 2018-12-27 MED ORDER — BISACODYL 5 MG PO TBEC
10.0000 mg | DELAYED_RELEASE_TABLET | Freq: Every day | ORAL | Status: DC
  Administered 2018-12-28 – 2019-01-04 (×4): 10 mg via ORAL
  Filled 2018-12-27 (×7): qty 2

## 2018-12-27 MED ORDER — OXYCODONE HCL 5 MG PO TABS
5.0000 mg | ORAL_TABLET | ORAL | Status: DC | PRN
  Administered 2018-12-28 – 2019-01-02 (×7): 10 mg via ORAL
  Filled 2018-12-27 (×7): qty 2

## 2018-12-27 MED ORDER — CHLORHEXIDINE GLUCONATE 0.12 % MT SOLN
OROMUCOSAL | Status: AC
  Administered 2018-12-27: 07:00:00 15 mL via OROMUCOSAL
  Filled 2018-12-27: qty 15

## 2018-12-27 MED ORDER — SODIUM BICARBONATE 8.4 % IV SOLN
100.0000 meq | Freq: Once | INTRAVENOUS | Status: AC
  Administered 2018-12-27: 100 meq via INTRAVENOUS

## 2018-12-27 MED ORDER — LACTATED RINGERS IV SOLN
INTRAVENOUS | Status: DC | PRN
  Administered 2018-12-27 (×2): via INTRAVENOUS

## 2018-12-27 MED ORDER — SODIUM CHLORIDE 0.9 % IV SOLN
INTRAVENOUS | Status: DC
  Administered 2018-12-27: 16:00:00 via INTRAVENOUS

## 2018-12-27 MED ORDER — SODIUM CHLORIDE 0.9 % IV SOLN: INTRAVENOUS | Status: DC

## 2018-12-27 MED ORDER — MORPHINE SULFATE (PF) 2 MG/ML IV SOLN
1.0000 mg | INTRAVENOUS | Status: DC | PRN
  Administered 2018-12-27 – 2018-12-28 (×5): 2 mg via INTRAVENOUS
  Administered 2018-12-28 (×2): 4 mg via INTRAVENOUS
  Filled 2018-12-27: qty 2
  Filled 2018-12-27: qty 1
  Filled 2018-12-27: qty 2
  Filled 2018-12-27 (×4): qty 1

## 2018-12-27 MED ORDER — DOCUSATE SODIUM 100 MG PO CAPS
200.0000 mg | ORAL_CAPSULE | Freq: Every day | ORAL | Status: DC
  Administered 2018-12-28 – 2019-01-04 (×5): 200 mg via ORAL
  Filled 2018-12-27 (×8): qty 2

## 2018-12-27 MED ORDER — SODIUM CHLORIDE 0.9% FLUSH: 3.0000 mL | INTRAVENOUS | Status: DC | PRN

## 2018-12-27 MED ORDER — DEXTROSE 50 % IV SOLN
INTRAVENOUS | Status: DC | PRN
  Administered 2018-12-27: 12.5 g via INTRAVENOUS

## 2018-12-27 MED ORDER — LACTATED RINGERS IV SOLN: INTRAVENOUS | Status: DC

## 2018-12-27 MED ORDER — PHENYLEPHRINE HCL-NACL 20-0.9 MG/250ML-% IV SOLN
0.0000 ug/min | INTRAVENOUS | Status: DC
  Administered 2018-12-27 – 2018-12-28 (×2): 30 ug/min via INTRAVENOUS
  Administered 2018-12-28: 20 ug/min via INTRAVENOUS
  Administered 2018-12-28: 35 ug/min via INTRAVENOUS
  Filled 2018-12-27 (×3): qty 250

## 2018-12-27 MED ORDER — ASPIRIN EC 325 MG PO TBEC
325.0000 mg | DELAYED_RELEASE_TABLET | Freq: Every day | ORAL | Status: DC
  Administered 2018-12-28: 325 mg via ORAL
  Filled 2018-12-27: qty 1

## 2018-12-27 MED ORDER — EPHEDRINE SULFATE-NACL 50-0.9 MG/10ML-% IV SOSY
PREFILLED_SYRINGE | INTRAVENOUS | Status: DC | PRN
  Administered 2018-12-27: 10 mg via INTRAVENOUS
  Administered 2018-12-27 (×3): 5 mg via INTRAVENOUS

## 2018-12-27 MED ORDER — DEXTROSE 50 % IV SOLN
20.0000 mL | Freq: Once | INTRAVENOUS | Status: AC
  Administered 2018-12-27: 20 mL via INTRAVENOUS

## 2018-12-27 MED ORDER — STERILE WATER FOR INJECTION IJ SOLN
INTRAMUSCULAR | Status: DC | PRN
  Administered 2018-12-27: 10 mL

## 2018-12-27 MED ORDER — STERILE WATER FOR IRRIGATION IR SOLN
Status: DC | PRN
  Administered 2018-12-27: 1000 mL

## 2018-12-27 MED ORDER — VANCOMYCIN HCL 1000 MG IV SOLR
INTRAVENOUS | Status: DC | PRN
  Administered 2018-12-27: 1000 mL

## 2018-12-27 MED ORDER — DEXTROSE-NACL 5-0.45 % IV SOLN
INTRAVENOUS | Status: DC
  Administered 2018-12-27 – 2018-12-28 (×2): via INTRAVENOUS

## 2018-12-27 MED ORDER — CALCIUM CHLORIDE 10 % IV SOLN
INTRAVENOUS | Status: DC | PRN
  Administered 2018-12-27: 300 mg via INTRAVENOUS
  Administered 2018-12-27 (×3): 100 mg via INTRAVENOUS
  Administered 2018-12-27: 300 mg via INTRAVENOUS

## 2018-12-27 MED ORDER — STERILE WATER FOR INJECTION IJ SOLN
INTRAMUSCULAR | Status: AC
  Filled 2018-12-27: qty 10

## 2018-12-27 MED ORDER — BISACODYL 10 MG RE SUPP: 10.0000 mg | Freq: Every day | RECTAL | Status: DC

## 2018-12-27 MED ORDER — BUPIVACAINE HCL (PF) 0.5 % IJ SOLN
INTRAMUSCULAR | Status: DC | PRN
  Administered 2018-12-27: 30 mL

## 2018-12-27 MED ORDER — SODIUM CHLORIDE 0.9 % IV SOLN
20.0000 ug | Freq: Once | INTRAVENOUS | Status: AC
  Administered 2018-12-27: 20 ug via INTRAVENOUS
  Filled 2018-12-27: qty 5

## 2018-12-27 MED ORDER — VANCOMYCIN HCL 1000 MG IV SOLR
INTRAVENOUS | Status: DC | PRN
  Administered 2018-12-27: 3 g

## 2018-12-27 MED ORDER — HEPARIN SODIUM (PORCINE) 1000 UNIT/ML IJ SOLN
INTRAMUSCULAR | Status: AC
  Filled 2018-12-27: qty 1

## 2018-12-27 MED ORDER — ACETAMINOPHEN 160 MG/5ML PO SOLN: 650.0000 mg | Freq: Once | ORAL | Status: AC

## 2018-12-27 MED ORDER — MAGNESIUM SULFATE 4 GM/100ML IV SOLN
4.0000 g | Freq: Once | INTRAVENOUS | Status: DC
  Filled 2018-12-27: qty 100

## 2018-12-27 MED ORDER — NON FORMULARY
Status: DC | PRN
  Administered 2018-12-27: 10:00:00 30 mL via INTRA_ARTERIAL

## 2018-12-27 MED ORDER — PANTOPRAZOLE SODIUM 40 MG PO TBEC
40.0000 mg | DELAYED_RELEASE_TABLET | Freq: Every day | ORAL | Status: DC
  Administered 2018-12-29 – 2019-01-04 (×7): 40 mg via ORAL
  Filled 2018-12-27 (×8): qty 1

## 2018-12-27 MED ORDER — METOPROLOL TARTRATE 12.5 MG HALF TABLET: 12.5000 mg | ORAL_TABLET | Freq: Once | ORAL | Status: DC

## 2018-12-27 MED ORDER — PROPOFOL 10 MG/ML IV BOLUS
INTRAVENOUS | Status: AC
  Filled 2018-12-27: qty 20

## 2018-12-27 MED ORDER — METOCLOPRAMIDE HCL 5 MG/ML IJ SOLN
10.0000 mg | Freq: Four times a day (QID) | INTRAMUSCULAR | Status: AC
  Administered 2018-12-27 – 2018-12-28 (×3): 10 mg via INTRAVENOUS
  Filled 2018-12-27 (×3): qty 2

## 2018-12-27 MED ORDER — ONDANSETRON HCL 4 MG/2ML IJ SOLN
4.0000 mg | Freq: Four times a day (QID) | INTRAMUSCULAR | Status: DC | PRN
  Administered 2018-12-29 – 2018-12-30 (×3): 4 mg via INTRAVENOUS
  Filled 2018-12-27 (×3): qty 2

## 2018-12-27 MED ORDER — MIDAZOLAM HCL 2 MG/2ML IJ SOLN: 2.0000 mg | INTRAMUSCULAR | Status: DC | PRN

## 2018-12-27 MED ORDER — CHLORHEXIDINE GLUCONATE 0.12% ORAL RINSE (MEDLINE KIT)
15.0000 mL | Freq: Two times a day (BID) | OROMUCOSAL | Status: DC
  Administered 2018-12-27: 15 mL via OROMUCOSAL

## 2018-12-27 MED ORDER — VANCOMYCIN HCL 1000 MG IV SOLR
INTRAVENOUS | Status: AC
  Filled 2018-12-27: qty 3000

## 2018-12-27 MED ORDER — PROTAMINE SULFATE 10 MG/ML IV SOLN
INTRAVENOUS | Status: AC
  Filled 2018-12-27: qty 25

## 2018-12-27 MED ORDER — AMIODARONE HCL IN DEXTROSE 360-4.14 MG/200ML-% IV SOLN
30.0000 mg/h | INTRAVENOUS | Status: DC
  Administered 2018-12-27 – 2018-12-28 (×2): 30 mg/h via INTRAVENOUS
  Filled 2018-12-27: qty 200

## 2018-12-27 MED ORDER — CHLORHEXIDINE GLUCONATE 4 % EX LIQD: 30.0000 mL | CUTANEOUS | Status: DC

## 2018-12-27 MED ORDER — SODIUM CHLORIDE 0.9 % IV SOLN
1.5000 g | Freq: Two times a day (BID) | INTRAVENOUS | Status: DC
  Administered 2018-12-27 – 2018-12-28 (×2): 1.5 g via INTRAVENOUS
  Filled 2018-12-27 (×3): qty 1.5

## 2018-12-27 MED ORDER — BUPIVACAINE HCL (PF) 0.5 % IJ SOLN
INTRAMUSCULAR | Status: AC
  Filled 2018-12-27: qty 30

## 2018-12-27 MED ORDER — ORAL CARE MOUTH RINSE
15.0000 mL | Freq: Two times a day (BID) | OROMUCOSAL | Status: DC
  Administered 2018-12-28 – 2019-01-04 (×12): 15 mL via OROMUCOSAL

## 2018-12-27 MED ORDER — ASPIRIN 81 MG PO CHEW
324.0000 mg | CHEWABLE_TABLET | Freq: Every day | ORAL | Status: DC
Start: 2018-12-28 — End: 2018-12-28

## 2018-12-27 MED ORDER — ALBUTEROL SULFATE HFA 108 (90 BASE) MCG/ACT IN AERS
INHALATION_SPRAY | RESPIRATORY_TRACT | Status: DC | PRN
  Administered 2018-12-27: 6 via RESPIRATORY_TRACT

## 2018-12-27 MED ORDER — SODIUM CHLORIDE 0.9 % IV SOLN
INTRAVENOUS | Status: DC | PRN
  Administered 2018-12-27: 14:00:00 via INTRAVENOUS

## 2018-12-27 MED ORDER — AMIODARONE IV BOLUS ONLY 150 MG/100ML
INTRAVENOUS | Status: DC | PRN
  Administered 2018-12-27: 150 mg via INTRAVENOUS

## 2018-12-27 MED ORDER — MIDAZOLAM HCL 5 MG/5ML IJ SOLN
INTRAMUSCULAR | Status: DC | PRN
  Administered 2018-12-27 (×2): 1 mg via INTRAVENOUS
  Administered 2018-12-27 (×2): 2 mg via INTRAVENOUS
  Administered 2018-12-27 (×2): 1 mg via INTRAVENOUS
  Administered 2018-12-27: 2 mg via INTRAVENOUS

## 2018-12-27 MED ORDER — HEPARIN SODIUM (PORCINE) 1000 UNIT/ML IJ SOLN
INTRAMUSCULAR | Status: DC | PRN
  Administered 2018-12-27: 5000 [IU] via INTRAVENOUS
  Administered 2018-12-27: 17000 [IU] via INTRAVENOUS
  Administered 2018-12-27: 8000 [IU] via INTRAVENOUS

## 2018-12-27 MED ORDER — BUPIVACAINE LIPOSOME 1.3 % IJ SUSP
20.0000 mL | Freq: Once | INTRAMUSCULAR | Status: DC
  Filled 2018-12-27: qty 20

## 2018-12-27 MED ORDER — AMIODARONE HCL IN DEXTROSE 360-4.14 MG/200ML-% IV SOLN
INTRAVENOUS | Status: DC | PRN
  Administered 2018-12-27: 60 mg/h via INTRAVENOUS

## 2018-12-27 MED ORDER — AMIODARONE HCL IN DEXTROSE 360-4.14 MG/200ML-% IV SOLN: 60.0000 mg/h | INTRAVENOUS | Status: AC

## 2018-12-27 MED ORDER — AMIODARONE HCL IN DEXTROSE 360-4.14 MG/200ML-% IV SOLN
30.0000 mg/h | INTRAVENOUS | Status: DC
  Filled 2018-12-27: qty 200

## 2018-12-27 MED ORDER — POTASSIUM CHLORIDE 10 MEQ/50ML IV SOLN: 10.0000 meq | INTRAVENOUS | Status: AC

## 2018-12-27 MED ORDER — ALBUMIN HUMAN 5 % IV SOLN
INTRAVENOUS | Status: DC | PRN
  Administered 2018-12-27: 14:00:00 via INTRAVENOUS

## 2018-12-27 MED ORDER — CHLORHEXIDINE GLUCONATE 0.12 % MT SOLN: 15.0000 mL | Freq: Once | OROMUCOSAL | Status: DC

## 2018-12-27 MED ORDER — ACETAMINOPHEN 160 MG/5ML PO SOLN: 1000.0000 mg | Freq: Four times a day (QID) | ORAL | Status: AC

## 2018-12-27 MED ORDER — SODIUM CHLORIDE 0.9% FLUSH
3.0000 mL | Freq: Two times a day (BID) | INTRAVENOUS | Status: DC
  Administered 2018-12-28 – 2019-01-04 (×9): 3 mL via INTRAVENOUS

## 2018-12-27 MED ORDER — ACETAMINOPHEN 500 MG PO TABS
1000.0000 mg | ORAL_TABLET | Freq: Four times a day (QID) | ORAL | Status: AC
  Administered 2018-12-27 – 2019-01-01 (×13): 1000 mg via ORAL
  Filled 2018-12-27 (×15): qty 2

## 2018-12-27 MED ORDER — NOREPINEPHRINE 4 MG/250ML-% IV SOLN
INTRAVENOUS | Status: DC | PRN
  Administered 2018-12-27: 2 ug/min via INTRAVENOUS

## 2018-12-27 MED ORDER — LACTATED RINGERS IV SOLN: 500.0000 mL | Freq: Once | INTRAVENOUS | Status: DC | PRN

## 2018-12-27 MED ORDER — MIDAZOLAM HCL (PF) 10 MG/2ML IJ SOLN
INTRAMUSCULAR | Status: AC
  Filled 2018-12-27: qty 2

## 2018-12-27 MED ORDER — ALBUTEROL SULFATE (2.5 MG/3ML) 0.083% IN NEBU
INHALATION_SOLUTION | RESPIRATORY_TRACT | Status: AC
  Administered 2018-12-27: 2.5 mg
  Filled 2018-12-27: qty 3

## 2018-12-27 MED ORDER — METOPROLOL TARTRATE 5 MG/5ML IV SOLN: 2.5000 mg | INTRAVENOUS | Status: DC | PRN

## 2018-12-27 MED ORDER — ORAL CARE MOUTH RINSE
15.0000 mL | OROMUCOSAL | Status: DC
  Administered 2018-12-27: 15 mL via OROMUCOSAL

## 2018-12-27 MED ORDER — METOPROLOL TARTRATE 12.5 MG HALF TABLET
12.5000 mg | ORAL_TABLET | Freq: Two times a day (BID) | ORAL | Status: DC
  Administered 2018-12-29: 12.5 mg via ORAL
  Filled 2018-12-27 (×2): qty 1

## 2018-12-27 MED ORDER — HEMOSTATIC AGENTS (NO CHARGE) OPTIME
TOPICAL | Status: DC | PRN
  Administered 2018-12-27: 3 via TOPICAL

## 2018-12-27 MED ORDER — PROPOFOL 10 MG/ML IV BOLUS
INTRAVENOUS | Status: DC | PRN
  Administered 2018-12-27: 90 mg via INTRAVENOUS

## 2018-12-27 MED ORDER — PHENYLEPHRINE 40 MCG/ML (10ML) SYRINGE FOR IV PUSH (FOR BLOOD PRESSURE SUPPORT)
PREFILLED_SYRINGE | INTRAVENOUS | Status: DC | PRN
  Administered 2018-12-27: 80 ug via INTRAVENOUS
  Administered 2018-12-27: 40 ug via INTRAVENOUS

## 2018-12-27 MED ORDER — PLASMA-LYTE 148 IV SOLN
INTRAVENOUS | Status: DC | PRN
  Administered 2018-12-27: 500 mL via INTRAVASCULAR

## 2018-12-27 MED ORDER — INSULIN REGULAR BOLUS VIA INFUSION
0.0000 [IU] | Freq: Three times a day (TID) | INTRAVENOUS | Status: DC
  Filled 2018-12-27: qty 10

## 2018-12-27 MED ORDER — SODIUM CHLORIDE (PF) 0.9 % IJ SOLN
INTRAMUSCULAR | Status: AC
  Filled 2018-12-27: qty 10

## 2018-12-27 MED ORDER — SODIUM CHLORIDE 0.45 % IV SOLN
INTRAVENOUS | Status: DC | PRN
  Administered 2018-12-27: 16:00:00 via INTRAVENOUS

## 2018-12-27 MED ORDER — CHLORHEXIDINE GLUCONATE CLOTH 2 % EX PADS
6.0000 | MEDICATED_PAD | Freq: Every day | CUTANEOUS | Status: DC
  Administered 2018-12-28 – 2019-01-03 (×5): 6 via TOPICAL

## 2018-12-27 MED ORDER — FAMOTIDINE IN NACL 20-0.9 MG/50ML-% IV SOLN
20.0000 mg | Freq: Two times a day (BID) | INTRAVENOUS | Status: AC
  Administered 2018-12-27 (×2): 20 mg via INTRAVENOUS
  Filled 2018-12-27: qty 50

## 2018-12-27 MED ORDER — NOREPINEPHRINE 4 MG/250ML-% IV SOLN: 0.0000 ug/min | INTRAVENOUS | Status: DC

## 2018-12-27 MED ORDER — VANCOMYCIN HCL IN DEXTROSE 1-5 GM/200ML-% IV SOLN
1000.0000 mg | Freq: Once | INTRAVENOUS | Status: AC
  Administered 2018-12-27: 1000 mg via INTRAVENOUS
  Filled 2018-12-27: qty 200

## 2018-12-27 MED ORDER — DEXMEDETOMIDINE HCL IN NACL 400 MCG/100ML IV SOLN
0.0000 ug/kg/h | INTRAVENOUS | Status: DC
Start: 2018-12-27 — End: 2018-12-28
  Administered 2018-12-27 (×2): 0.7 ug/kg/h via INTRAVENOUS
  Filled 2018-12-27: qty 100

## 2018-12-27 MED ORDER — ROCURONIUM BROMIDE 10 MG/ML (PF) SYRINGE
PREFILLED_SYRINGE | INTRAVENOUS | Status: AC
  Filled 2018-12-27: qty 20

## 2018-12-27 MED ORDER — FENTANYL CITRATE (PF) 250 MCG/5ML IJ SOLN
INTRAMUSCULAR | Status: AC
  Filled 2018-12-27: qty 25

## 2018-12-27 MED ORDER — METOPROLOL TARTRATE 25 MG/10 ML ORAL SUSPENSION: 12.5000 mg | Freq: Two times a day (BID) | ORAL | Status: DC

## 2018-12-27 MED ORDER — TRAMADOL HCL 50 MG PO TABS
50.0000 mg | ORAL_TABLET | ORAL | Status: DC | PRN
  Administered 2018-12-28: 100 mg via ORAL
  Filled 2018-12-27: qty 2

## 2018-12-27 MED ORDER — FAMOTIDINE 20 MG PO TABS
20.0000 mg | ORAL_TABLET | Freq: Once | ORAL | Status: AC
  Administered 2018-12-27: 20 mg via ORAL
  Filled 2018-12-27: qty 1

## 2018-12-27 MED ORDER — LACTATED RINGERS IV SOLN
INTRAVENOUS | Status: DC
  Administered 2018-12-27: 08:00:00 via INTRAVENOUS

## 2018-12-27 MED ORDER — ROCURONIUM BROMIDE 10 MG/ML (PF) SYRINGE
PREFILLED_SYRINGE | INTRAVENOUS | Status: DC | PRN
  Administered 2018-12-27: 60 mg via INTRAVENOUS
  Administered 2018-12-27: 50 mg via INTRAVENOUS
  Administered 2018-12-27: 40 mg via INTRAVENOUS
  Administered 2018-12-27: 50 mg via INTRAVENOUS

## 2018-12-27 MED ORDER — EPHEDRINE 5 MG/ML INJ
INTRAVENOUS | Status: AC
  Filled 2018-12-27: qty 20

## 2018-12-27 MED ORDER — ACETAMINOPHEN 650 MG RE SUPP
650.0000 mg | Freq: Once | RECTAL | Status: AC
  Administered 2018-12-27: 650 mg via RECTAL

## 2018-12-27 MED ORDER — CHLORHEXIDINE GLUCONATE 0.12 % MT SOLN
15.0000 mL | OROMUCOSAL | Status: AC
  Administered 2018-12-27: 07:00:00 15 mL via OROMUCOSAL

## 2018-12-27 MED ORDER — NITROGLYCERIN IN D5W 200-5 MCG/ML-% IV SOLN: 7.0000 ug/min | INTRAVENOUS | Status: DC

## 2018-12-27 MED ORDER — BUPIVACAINE LIPOSOME 1.3 % IJ SUSP
INTRAMUSCULAR | Status: DC | PRN
  Administered 2018-12-27: 20 mL

## 2018-12-27 MED ORDER — SODIUM CHLORIDE 0.9 % IV SOLN: 250.0000 mL | INTRAVENOUS | Status: DC

## 2018-12-27 MED ORDER — INSULIN ASPART 100 UNIT/ML ~~LOC~~ SOLN
SUBCUTANEOUS | Status: DC | PRN
  Administered 2018-12-27: 10 [IU] via INTRAVENOUS
  Administered 2018-12-27: 10 [IU] via SUBCUTANEOUS

## 2018-12-27 MED ORDER — NITROGLYCERIN IN D5W 200-5 MCG/ML-% IV SOLN
0.0000 ug/min | INTRAVENOUS | Status: DC
  Administered 2018-12-27: 16.667 ug/min via INTRAVENOUS

## 2018-12-27 MED ORDER — 0.9 % SODIUM CHLORIDE (POUR BTL) OPTIME
TOPICAL | Status: DC | PRN
  Administered 2018-12-27: 5000 mL

## 2018-12-27 MED ORDER — FENTANYL CITRATE (PF) 250 MCG/5ML IJ SOLN
INTRAMUSCULAR | Status: DC | PRN
  Administered 2018-12-27: 100 ug via INTRAVENOUS
  Administered 2018-12-27 (×3): 50 ug via INTRAVENOUS
  Administered 2018-12-27: 100 ug via INTRAVENOUS
  Administered 2018-12-27: 150 ug via INTRAVENOUS
  Administered 2018-12-27: 100 ug via INTRAVENOUS
  Administered 2018-12-27: 150 ug via INTRAVENOUS
  Administered 2018-12-27: 100 ug via INTRAVENOUS
  Administered 2018-12-27: 150 ug via INTRAVENOUS

## 2018-12-27 MED ORDER — INSULIN REGULAR(HUMAN) IN NACL 100-0.9 UT/100ML-% IV SOLN
INTRAVENOUS | Status: DC
  Administered 2018-12-27: 0.3 [IU]/h via INTRAVENOUS

## 2018-12-27 MED ORDER — PHENYLEPHRINE 40 MCG/ML (10ML) SYRINGE FOR IV PUSH (FOR BLOOD PRESSURE SUPPORT)
PREFILLED_SYRINGE | INTRAVENOUS | Status: AC
  Filled 2018-12-27: qty 10

## 2018-12-27 MED ORDER — LACTATED RINGERS IV SOLN
INTRAVENOUS | Status: DC | PRN
  Administered 2018-12-27: 08:00:00 via INTRAVENOUS

## 2018-12-27 SURGICAL SUPPLY — 126 items
ADAPTER CARDIO PERF ANTE/RETRO (ADAPTER) ×4 IMPLANT
APPLIER CLIP 9.375 SM OPEN (CLIP) ×12
BAG DECANTER FOR FLEXI CONT (MISCELLANEOUS) ×4 IMPLANT
BASKET HEART (ORDER IN 25'S) (MISCELLANEOUS) ×1
BASKET HEART (ORDER IN 25S) (MISCELLANEOUS) ×3 IMPLANT
BLADE 10 SAFETY STRL DISP (BLADE) ×1 IMPLANT
BLADE 11 SAFETY STRL DISP (BLADE) ×1 IMPLANT
BLADE CLIPPER SURG (BLADE) ×4 IMPLANT
BLADE STERNUM SYSTEM 6 (BLADE) ×4 IMPLANT
BLADE SURG 15 STRL LF DISP TIS (BLADE) ×3 IMPLANT
BLADE SURG 15 STRL SS (BLADE) ×1
BNDG ELASTIC 4X5.8 VLCR STR LF (GAUZE/BANDAGES/DRESSINGS) ×4 IMPLANT
BNDG ELASTIC 6X5.8 VLCR STR LF (GAUZE/BANDAGES/DRESSINGS) ×4 IMPLANT
BNDG ESMARK 4X9 LF (GAUZE/BANDAGES/DRESSINGS) ×1 IMPLANT
BNDG GAUZE ELAST 4 BULKY (GAUZE/BANDAGES/DRESSINGS) ×4 IMPLANT
CANISTER SUCT 3000ML PPV (MISCELLANEOUS) ×4 IMPLANT
CANNULA NON VENT 18FR 12 (CANNULA) ×1 IMPLANT
CATH CPB KIT HENDRICKSON (MISCELLANEOUS) ×4 IMPLANT
CATH ROBINSON RED A/P 18FR (CATHETERS) ×8 IMPLANT
CLIP APPLIE 9.375 SM OPEN (CLIP) ×3 IMPLANT
CLIP RETRACTION 3.0MM CORONARY (MISCELLANEOUS) ×5 IMPLANT
CLIP VESOCCLUDE MED 24/CT (CLIP) IMPLANT
CLIP VESOCCLUDE SM WIDE 24/CT (CLIP) ×3 IMPLANT
CONN ST 1/4X3/8  BEN (MISCELLANEOUS) ×2
CONN ST 1/4X3/8 BEN (MISCELLANEOUS) IMPLANT
COVER MAYO STAND STRL (DRAPES) ×4 IMPLANT
COVER WAND RF STERILE (DRAPES) ×3 IMPLANT
CUFF TOURN SGL QUICK 34 (TOURNIQUET CUFF) ×1
CUFF TRNQT CYL 34X4.125X (TOURNIQUET CUFF) IMPLANT
DEFOGGER ANTIFOG KIT (MISCELLANEOUS) ×1 IMPLANT
DERMABOND ADVANCED (GAUZE/BANDAGES/DRESSINGS) ×2
DERMABOND ADVANCED .7 DNX12 (GAUZE/BANDAGES/DRESSINGS) ×3 IMPLANT
DRAIN CHANNEL 28F RND 3/8 FF (WOUND CARE) ×12 IMPLANT
DRAPE CARDIOVASCULAR INCISE (DRAPES) ×1
DRAPE EXTREMITY T 121X128X90 (DISPOSABLE) ×1 IMPLANT
DRAPE HALF SHEET 40X57 (DRAPES) ×4 IMPLANT
DRAPE SLUSH/WARMER DISC (DRAPES) ×4 IMPLANT
DRAPE SRG 135X102X78XABS (DRAPES) ×3 IMPLANT
DRSG AQUACEL AG ADV 3.5X14 (GAUZE/BANDAGES/DRESSINGS) ×4 IMPLANT
ELECT BLADE 4.0 EZ CLEAN MEGAD (MISCELLANEOUS) ×4
ELECT CAUTERY BLADE 6.4 (BLADE) ×5 IMPLANT
ELECT REM PT RETURN 9FT ADLT (ELECTROSURGICAL) ×8
ELECTRODE BLDE 4.0 EZ CLN MEGD (MISCELLANEOUS) IMPLANT
ELECTRODE REM PT RTRN 9FT ADLT (ELECTROSURGICAL) ×6 IMPLANT
FELT TEFLON 1X6 (MISCELLANEOUS) ×8 IMPLANT
GAUZE SPONGE 4X4 12PLY STRL (GAUZE/BANDAGES/DRESSINGS) ×8 IMPLANT
GEL ULTRASOUND 20GR AQUASONIC (MISCELLANEOUS) ×3 IMPLANT
GLOVE BIO SURGEON STRL SZ 6.5 (GLOVE) ×3 IMPLANT
GLOVE BIO SURGEON STRL SZ7.5 (GLOVE) ×1 IMPLANT
GLOVE BIOGEL PI IND STRL 6.5 (GLOVE) IMPLANT
GLOVE BIOGEL PI IND STRL 7.5 (GLOVE) IMPLANT
GLOVE BIOGEL PI IND STRL 8.5 (GLOVE) IMPLANT
GLOVE BIOGEL PI INDICATOR 6.5 (GLOVE) ×5
GLOVE BIOGEL PI INDICATOR 7.5 (GLOVE) ×4
GLOVE BIOGEL PI INDICATOR 8.5 (GLOVE) ×1
GLOVE NEODERM STRL 7.5 LF PF (GLOVE) ×9 IMPLANT
GLOVE SURG NEODERM 7.5  LF PF (GLOVE) ×3
GLOVE SURG SS PI 7.0 STRL IVOR (GLOVE) ×3 IMPLANT
GOWN STRL REUS W/ TWL LRG LVL3 (GOWN DISPOSABLE) ×12 IMPLANT
GOWN STRL REUS W/TWL LRG LVL3 (GOWN DISPOSABLE) ×8
HEMOSTAT POWDER SURGIFOAM 1G (HEMOSTASIS) ×12 IMPLANT
HEMOSTAT SURGICEL 2X14 (HEMOSTASIS) ×4 IMPLANT
INSERT FOGARTY XLG (MISCELLANEOUS) ×2 IMPLANT
KIT BASIN OR (CUSTOM PROCEDURE TRAY) ×4 IMPLANT
KIT MAXAN 1 LEVEL (KITS) ×1 IMPLANT
KIT SUCTION CATH 14FR (SUCTIONS) ×4 IMPLANT
KIT TURNOVER KIT B (KITS) ×4 IMPLANT
KIT VASOVIEW HEMOPRO 2 VH 4000 (KITS) ×4 IMPLANT
LEAD PACING MYOCARDI (MISCELLANEOUS) ×4 IMPLANT
MARKER GRAFT CORONARY BYPASS (MISCELLANEOUS) ×12 IMPLANT
MARKER SKIN DUAL TIP RULER LAB (MISCELLANEOUS) ×1 IMPLANT
NDL 18GX1X1/2 (RX/OR ONLY) (NEEDLE) ×3 IMPLANT
NEEDLE 18GX1X1/2 (RX/OR ONLY) (NEEDLE) ×4 IMPLANT
NS IRRIG 1000ML POUR BTL (IV SOLUTION) ×20 IMPLANT
PACK E OPEN HEART (SUTURE) ×4 IMPLANT
PACK OPEN HEART (CUSTOM PROCEDURE TRAY) ×4 IMPLANT
PACK SPY-PHI (KITS) IMPLANT
PAD ARMBOARD 7.5X6 YLW CONV (MISCELLANEOUS) ×8 IMPLANT
PAD CARDIAC INSULATION (MISCELLANEOUS) ×1 IMPLANT
PAD ELECT DEFIB RADIOL ZOLL (MISCELLANEOUS) ×4 IMPLANT
PENCIL BUTTON HOLSTER BLD 10FT (ELECTRODE) ×4 IMPLANT
POSITIONER HEAD DONUT 9IN (MISCELLANEOUS) ×4 IMPLANT
PUNCH AORTIC ROT 4.0MM RCL 40 (MISCELLANEOUS) ×1 IMPLANT
SEALANT SURG COSEAL 4ML (VASCULAR PRODUCTS) ×1 IMPLANT
SET CARDIOPLEGIA MPS 5001102 (MISCELLANEOUS) ×1 IMPLANT
SHEARS HARMONIC 9CM CVD (BLADE) ×3 IMPLANT
SHEARS HARMONIC STRL 23CM (MISCELLANEOUS) ×3 IMPLANT
SUPPORT HEART JANKE-BARRON (MISCELLANEOUS) ×1 IMPLANT
SUT BONE WAX W31G (SUTURE) ×4 IMPLANT
SUT ETHIBOND X763 2 0 SH 1 (SUTURE) ×2 IMPLANT
SUT MNCRL AB 3-0 PS2 18 (SUTURE) ×10 IMPLANT
SUT MNCRL AB 4-0 PS2 18 (SUTURE) ×1 IMPLANT
SUT PDS AB 1 CTX 36 (SUTURE) ×8 IMPLANT
SUT PROLENE 3 0 SH DA (SUTURE) ×4 IMPLANT
SUT PROLENE 4 0 RB 1 (SUTURE) ×1
SUT PROLENE 4 0 SH DA (SUTURE) ×2 IMPLANT
SUT PROLENE 4-0 RB1 .5 CRCL 36 (SUTURE) IMPLANT
SUT PROLENE 5 0 C 1 36 (SUTURE) IMPLANT
SUT PROLENE 6 0 C 1 30 (SUTURE) ×14 IMPLANT
SUT PROLENE 7 0 BV1 MDA (SUTURE) ×1 IMPLANT
SUT PROLENE 8 0 BV175 6 (SUTURE) ×2 IMPLANT
SUT PROLENE BLUE 7 0 (SUTURE) ×4 IMPLANT
SUT SILK 2 0 SH CR/8 (SUTURE) IMPLANT
SUT SILK 3 0 SH CR/8 (SUTURE) IMPLANT
SUT STEEL 6MS V (SUTURE) ×4 IMPLANT
SUT STEEL SZ 6 DBL 3X14 BALL (SUTURE) ×4 IMPLANT
SUT VIC AB 2-0 CT1 27 (SUTURE) ×2
SUT VIC AB 2-0 CT1 TAPERPNT 27 (SUTURE) IMPLANT
SUT VIC AB 2-0 CTX 27 (SUTURE) ×1 IMPLANT
SUT VIC AB 3-0 SH 27 (SUTURE)
SUT VIC AB 3-0 SH 27X BRD (SUTURE) IMPLANT
SUT VIC AB 3-0 X1 27 (SUTURE) IMPLANT
SYR 10ML LL (SYRINGE) ×2 IMPLANT
SYR 30ML LL (SYRINGE) ×4 IMPLANT
SYR 3ML LL SCALE MARK (SYRINGE) ×4 IMPLANT
SYR 50ML SLIP (SYRINGE) IMPLANT
SYSTEM SAHARA CHEST DRAIN ATS (WOUND CARE) ×4 IMPLANT
TAPE CLOTH SURG 4X10 WHT LF (GAUZE/BANDAGES/DRESSINGS) ×1 IMPLANT
TAPE PAPER 2X10 WHT MICROPORE (GAUZE/BANDAGES/DRESSINGS) ×1 IMPLANT
TOWEL GREEN STERILE (TOWEL DISPOSABLE) ×4 IMPLANT
TOWEL GREEN STERILE FF (TOWEL DISPOSABLE) ×4 IMPLANT
TRAY FOLEY SLVR 16FR TEMP STAT (SET/KITS/TRAYS/PACK) ×4 IMPLANT
TUBING LAP HI FLOW INSUFFLATIO (TUBING) ×4 IMPLANT
UNDERPAD 30X30 (UNDERPADS AND DIAPERS) ×4 IMPLANT
WATER STERILE IRR 1000ML POUR (IV SOLUTION) ×8 IMPLANT
WATER STERILE IRR 1000ML UROMA (IV SOLUTION) ×1 IMPLANT

## 2018-12-27 NOTE — Anesthesia Procedure Notes (Signed)
Central Venous Catheter Insertion Performed by: Effie Berkshire, MD, anesthesiologist Start/End11/05/2018 7:50 AM, 12/27/2018 8:00 AM Patient location: Pre-op. Preanesthetic checklist: patient identified, IV checked, site marked, risks and benefits discussed, surgical consent, monitors and equipment checked, pre-op evaluation, timeout performed and anesthesia consent Position: Trendelenburg Lidocaine 1% used for infiltration and patient sedated Hand hygiene performed , maximum sterile barriers used  and Seldinger technique used Catheter size: 8.5 Fr Total catheter length 10. Central line was placed.Sheath introducer Swan type:thermodilution PA Cath depth:50 Procedure performed using ultrasound guided technique. Ultrasound Notes:anatomy identified, needle tip was noted to be adjacent to the nerve/plexus identified, no ultrasound evidence of intravascular and/or intraneural injection and image(s) printed for medical record Attempts: 2 Following insertion, line sutured and dressing applied. Post procedure assessment: blood return through all ports, free fluid flow and no air  Patient tolerated the procedure well with no immediate complications. Additional procedure comments: Unable to pass guidewire during R IJ.  Small superficial mass noted near L IJ. L IJ placed below mass.  Marland Kitchen

## 2018-12-27 NOTE — Anesthesia Procedure Notes (Signed)
Arterial Line Insertion Start/End11/05/2018 8:05 AM Performed by: Alain Marion, CRNA, CRNA  Patient location: Pre-op. Preanesthetic checklist: patient identified, IV checked, risks and benefits discussed, surgical consent and monitors and equipment checked Lidocaine 1% used for infiltration Right, radial was placed Catheter size: 20 G Hand hygiene performed  and maximum sterile barriers used  Allen's test indicative of satisfactory collateral circulation Attempts: 2 (attempt by K. Olen Pel, CRNA x1, attempt by Zenon Mayo, CRNA x1) Procedure performed without using ultrasound guided technique. Following insertion, dressing applied and Biopatch. Post procedure assessment: normal  Patient tolerated the procedure well with no immediate complications.

## 2018-12-27 NOTE — Progress Notes (Signed)
Dr. Orvan Seen made aware of ABG results from 2030. Okay to proceed with wean protocol. Also notified of K+ 6.2. Per MD, keep patient on D5 1/2 NS infusion at 50/hr and insulin infusion overnight. Will continue to monitor.   Shirl Harris, RN

## 2018-12-27 NOTE — H&P (Signed)
History and Physical Interval Note:  12/27/2018 8:17 AM  Anthony Hess  has presented today for surgery, with the diagnosis of CAD.  The various methods of treatment have been discussed with the patient and family. After consideration of risks, benefits and other options for treatment, the patient has consented to  Procedure(s): CORONARY ARTERY BYPASS GRAFTING (CABG) (N/A) ENDOSCOPIC RADIAL ARTERY HARVEST (Left) TRANSESOPHAGEAL ECHOCARDIOGRAM (TEE) (N/A) INDOCYANINE GREEN FLUORESCENCE IMAGING (ICG) (N/A) as a surgical intervention.  The patient's history has been reviewed, patient examined, no change in status, stable for surgery.  I have reviewed the patient's chart and labs.  Questions were answered to the patient's satisfaction.     [name]

## 2018-12-27 NOTE — Brief Op Note (Signed)
12/27/2018  2:55 PM  PATIENT:  Anthony Hess  63 y.o. male  PRE-OPERATIVE DIAGNOSIS:  CORONARY ARTERY DISEASE  POST-OPERATIVE DIAGNOSIS:  CORONARY ARTERY DISEASE  PROCEDURE:  Procedure(s): CORONARY ARTERY BYPASS GRAFTING (CABG) X 4 ON PUMP USING RIGHT & LEFT INTERNAL MAMMARY ARTERY LEFT RADIAL ARTERY ENDOSCOPICALLY HARVESTED (N/A) ENDOSCOPIC RADIAL ARTERY HARVEST (Left) TRANSESOPHAGEAL ECHOCARDIOGRAM (TEE) (N/A) INDOCYANINE GREEN FLUORESCENCE IMAGING (ICG) (N/A)  SURGEON:  Surgeon(s) and Role:    * [name], MD - Primary  PHYSICIAN ASSISTANT:   ASSISTANTS: E. Barrett, PA-C   ANESTHESIA:   general  EBL:  400 mL   BLOOD ADMINISTERED:500 CC PRBC  DRAINS: 3 Chest Tube(s) in the mediastinum and bilateral pleural spaces   LOCAL MEDICATIONS USED:  OTHER expare/bupivicaine mixture  SPECIMEN:  No Specimen  DISPOSITION OF SPECIMEN:  N/A  COUNTS:  YES  TOURNIQUET:   Total Tourniquet Time Documented: Upper Arm (Left) - 24 minutes Total: Upper Arm (Left) - 24 minutes   DICTATION: .Note written in EPIC  PLAN OF CARE: Admit to inpatient   PATIENT DISPOSITION:  ICU - intubated and hemodynamically stable.   Delay start of Pharmacological VTE agent (>24hrs) due to surgical blood loss or risk of bleeding: yes  Anthony Hess, Anthony Hess

## 2018-12-27 NOTE — Transfer of Care (Signed)
Immediate Anesthesia Transfer of Care Note  Patient: Anthony Hess  Procedure(s) Performed: CORONARY ARTERY BYPASS GRAFTING (CABG) X 4 ON PUMP USING RIGHT & LEFT INTERNAL MAMMARY ARTERY LEFT RADIAL ARTERY ENDOSCOPICALLY HARVESTED (N/A Chest) ENDOSCOPIC RADIAL ARTERY HARVEST (Left Arm Lower) TRANSESOPHAGEAL ECHOCARDIOGRAM (TEE) (N/A ) INDOCYANINE GREEN FLUORESCENCE IMAGING (ICG) (N/A )  Patient Location: ICU  Anesthesia Type:General  Level of Consciousness: Patient remains intubated per anesthesia plan  Airway & Oxygen Therapy: Patient remains intubated per anesthesia plan and Patient placed on Ventilator (see vital sign flow sheet for setting)  Post-op Assessment: Report given to RN and Post -op Vital signs reviewed and stable  Post vital signs: Reviewed and stable  Last Vitals:  Vitals Value Taken Time  BP    Temp    Pulse 80 12/27/18 1523  Resp 18 12/27/18 1523  SpO2 97 % 12/27/18 1523  Vitals shown include unvalidated device data.  Last Pain:  Vitals:   12/27/18 0657  TempSrc: Oral      Patients Stated Pain Goal: 3 (62/86/38 1771)  Complications: No apparent anesthesia complications

## 2018-12-27 NOTE — Progress Notes (Signed)
  Echocardiogram Echocardiogram Transesophageal has been performed.  Anthony Hess 12/27/2018, 10:18 AM

## 2018-12-27 NOTE — Anesthesia Procedure Notes (Signed)
Central Venous Catheter Insertion Performed by: Effie Berkshire, MD, anesthesiologist Start/End11/05/2018 8:00 AM, 12/27/2018 8:05 AM Patient location: Pre-op. Preanesthetic checklist: patient identified, IV checked, site marked, risks and benefits discussed, surgical consent, monitors and equipment checked, pre-op evaluation, timeout performed and anesthesia consent Hand hygiene performed  and maximum sterile barriers used  PA cath was placed.Swan type:thermodilution Procedure performed without using ultrasound guided technique. Attempts: 1 Patient tolerated the procedure well with no immediate complications.

## 2018-12-27 NOTE — Brief Op Note (Signed)
12/27/2018  9:55 AM  PATIENT:  Jeneen Rinks Kiesler  63 y.o. male  PRE-OPERATIVE DIAGNOSIS:  CORONARY ARTERY DISEASE  POST-OPERATIVE DIAGNOSIS:  CORONARY ARTERY DISEASE  PROCEDURE:  Procedure(s):  CORONARY ARTERY BYPASS GRAFTING x 4 -LIMA to LAD -RIMA to PDA -SEQUENTIAL RADIAL ARTERY to OM and DIAGONAL  ENDOSCOPIC HARVEST RADIAL ARTERY -Left   TRANSESOPHAGEAL ECHOCARDIOGRAM (TEE) (N/A) INDOCYANINE GREEN FLUORESCENCE IMAGING (ICG) (N/A)  SURGEON:  Surgeon(s) and Role:    * [name], MD - Primary  PHYSICIAN ASSISTANT: Ellwood Handler PA-C  ANESTHESIA:   general  EBL:  400 mL   BLOOD ADMINISTERED: CELLSAVER  DRAINS: Left and Right Pleural Chest Tubes, Mediastinal Chest Drains   LOCAL MEDICATIONS USED:  NONE  SPECIMEN:  No Specimen  DISPOSITION OF SPECIMEN:  N/A  COUNTS:  YES  TOURNIQUET:   Total Tourniquet Time Documented: Upper Arm (Left) - 24 minutes Total: Upper Arm (Left) - 24 minutes   DICTATION: .Viviann Spare Dictation  PLAN OF CARE: Admit to inpatient   PATIENT DISPOSITION:  ICU - intubated and hemodynamically stable.   Delay start of Pharmacological VTE agent (>24hrs) due to surgical blood loss or risk of bleeding: yes

## 2018-12-27 NOTE — Op Note (Signed)
CARDIOTHORACIC SURGERY OPERATIVE NOTE  Date of Procedure: 12/27/2018  Preoperative Diagnosis: Severe 3-vessel Coronary Artery Disease  Postoperative Diagnosis: Same  Procedure:    Coronary Artery Bypass Grafting x 4   Left Internal Mammary Artery to Distal Left Anterior Descending Coronary Artery; pedicled right IMA to Posterior Descending Coronary Artery ; left radial artery graft to OM1 and Diagonal artery as a sequential graft; endoscopic left radial artery harvesting; bilateral IMA harvesting; completion graft surveillance with indocyanine green fluorescence imaging (SPY)   Surgeon: B. Murvin Natal, MD  Assistant: Ellwood Handler, PA-C  Anesthesia: get  Operative Findings:  reduced left ventricular systolic function  good quality left internal mammary artery conduit  Good quality radial artery conduit  good quality target vessels for grafting    BRIEF CLINICAL NOTE AND INDICATIONS FOR SURGERY  63 yo man with h/o bladder CA was scheduled to undergo additional procedure under general anesthesia when he reported shortness of breath and chest pain with exertion. He has also been quite fatigued lately. Underwent cardiac clearance workup which lead to LHC. This demonstrated 3v CAD; he is referred for surgery as best form of therapy given the severity of coronary stenoses and diffuse nature of disease.   DETAILS OF THE OPERATIVE PROCEDURE  Preparation:  The patient is brought to the operating room on the above mentioned date and central monitoring was established by the anesthesia team including placement of Swan-Ganz catheter and radial arterial line. The patient is placed in the supine position on the operating table.  Intravenous antibiotics are administered. General endotracheal anesthesia is induced uneventfully. A Foley catheter is placed.  Baseline transesophageal echocardiogram was performed.  Findings were notable for mildly dilated LV with reduced LV function.  The  patient's chest, abdomen, both groins, left upper extremity and both lower extremities are prepared and draped in a sterile manner. A time out procedure is performed.   Surgical Approach and Conduit Harvest:  Attention is first turned to the left radial artery harvesting, which is performed by an endoscopic technique. Preoperatively, doppler examination of the left radial and ulnar arteries was performed to demonstrate safety in removing the left radial in regards to palm perfusion. After removal of the radial artery, the small surgical incisions in the upper extremity are closed with absorbable suture. The arm is then tucked at the side. Median sternotomy incision was performed and the left internal mammary artery is dissected from the chest wall and prepared for bypass grafting. The left internal mammary artery is notably good quality conduit but is relatively small.  Prior to dividing the pedicle distally, 5,000 units of heparin is given intravenously. The LIMA pedicle was transected distally noted to have excellent flow; it is treated with papaverine solution. Next, attention is turned to the right chest where the right IMA is harvested in a similar fashion. Prior to dividing the pedicle distally, full heparinization is achieved. The pedicle is divided and treated with papaverine solution.    Extracorporeal Cardiopulmonary Bypass and Myocardial Protection:  Attention is returned to the mediastinum. The pericardium is opened. The ascending aorta is nondiseased in appearance. The ascending aorta and the right atrium are cannulated for cardiopulmonary bypass.  Adequate heparinization is verified.    The entire pre-bypass portion of the operation was notable for stable hemodynamics.  Cardiopulmonary bypass was begun and the surface of the heart is inspected. Distal target vessels are selected for coronary artery bypass grafting. A cardioplegia cannula is placed in the ascending aorta.   The patient is  allowed to cool passively to 34C systemic temperature.  The aortic cross clamp is applied and cold blood cardioplegia is delivered initially in an antegrade fashion through the aortic root.   Iced saline slush is applied for topical hypothermia.  The initial cardioplegic arrest is rapid with early diastolic arrest.  Repeat doses of cardioplegia are administered intermittently throughout the entire cross clamp portion of the operation through the aortic root and through subsequently placed vein grafts in order to maintain completely flat electrocardiogram.  Myocardial protection was felt to be excellent.   Coronary Artery Bypass Grafting:  The 1st obtuse marginal branch of the left circumflex coronary artery was grafted using the left radial artery in an end-to-side fashion.  At the site of distal anastomosis the target vessel was good quality and measured approximately 2 mm in diameter. Anastomotic patency and runoff was confirmed with indocyanine green fluorescence imaging (SPY). The more proximal portion of the radial graft was then anastomosed to the 1st diagonal branch of the left anterior descending coronary artery in an side-to-side fashion to create a sequenced graft.  At the site of distal anastomosis the target vessel was good quality and measured approximately 2 mm in diameter.Anastomotic patency and runoff was confirmed with indocyanine green fluorescence imaging (SPY).  The distal left anterior coronary artery was grafted with the left internal mammary artery in an end-to-side fashion.  At the site of distal anastomosis the target vessel was good quality and measured approximately 1.5 mm in diameter.Anastomotic patency and runoff was confirmed with indocyanine green fluorescence imaging (SPY).  The  posterior descending branch of the right coronary artery was grafted using the pedicled RIMA graft in an end-to-side fashion.  At the site of distal anastomosis the target vessel was good quality  and measured approximately 1.5 mm in diameter.Anastomotic patency and runoff was confirmed with indocyanine green fluorescence imaging (SPY).   The proximal radial artery graft anastomosis wasplaced directly to the ascending aorta prior to removal of the aortic cross clamp.  The aortic cross clamp was removed after a total cross clamp time of 80 minutes.   Procedure Completion:  All proximal and distal coronary anastomoses were inspected for hemostasis and appropriate graft orientation. Epicardial pacing wires are fixed to the right ventricular outflow tract and to the right atrial appendage. The patient is rewarmed to 37C temperature. The patient is weaned and disconnected from cardiopulmonary bypass.  The patient's rhythm at separation from bypass was NSR.  The patient was weaned from cardiopulmonary bypass without any inotropic support. Total cardiopulmonary bypass time for the operation was 109 minutes.  Followup transesophageal echocardiogram performed after separation from bypass revealed  no changes from the preoperative exam.  The aortic and venous cannula were removed uneventfully. Protamine was administered to reverse the anticoagulation. The mediastinum and pleural space were inspected for hemostasis and irrigated with saline solution. The mediastinum and bilateral pleural space were drained using fluted chest tubes placed through separate stab incisions inferiorly.  The soft tissues anterior to the aorta were reapproximated loosely. The sternum is closed with double strength sternal wire. The soft tissues anterior to the sternum were closed in multiple layers and the skin is closed with a running subcuticular skin closure.  The post-bypass portion of the operation was notable for stable rhythm and hemodynamics.    Disposition:  The patient tolerated the procedure well and is transported to the surgical intensive care in stable condition. There are no intraoperative complications. All  sponge instrument and needle counts are  verified correct at completion of the operation.    Jayme Cloud, MD 12/27/2018 9:53 PM

## 2018-12-27 NOTE — Procedures (Signed)
Extubation Procedure Note  Patient Details:   Name: Anthony Hess DOB: 06/10/1955 MRN: [id number]   Airway Documentation:  Airway 8 mm (Active)  Secured at (cm) 19 cm 12/27/18 1956  Measured From Lips 12/27/18 1956  Secured Location Right 12/27/18 1956  Secured By Pink Tape 12/27/18 1956  Cuff Pressure (cm H2O) 28 cm H2O 12/27/18 1956  Site Condition Dry 12/27/18 1956   Patient extubated to 4L [place]. Patient able to get NIF -20 and VC 1.1L. No stridor noted. RN at beside working on IS with the patient.  Evaluation  O2 sats: stable throughout Complications: No apparent complications Patient did tolerate procedure well. Bilateral Breath Sounds: Clear   Yes  Kelle Darting 12/27/2018, 2234

## 2018-12-27 NOTE — Progress Notes (Signed)
Progress  CT surgery p.m. rounds  Patient waking up, ventilator wean process beginning Hemodynamic stable No air leak from chest tubes Postoperative labs stable Continue current care

## 2018-12-27 NOTE — Anesthesia Procedure Notes (Signed)
Procedure Name: Intubation Date/Time: 12/27/2018 8:54 AM Performed by: Janace Litten, CRNA Pre-anesthesia Checklist: Patient identified, Emergency Drugs available, Suction available and Patient being monitored Patient Re-evaluated:Patient Re-evaluated prior to induction Oxygen Delivery Method: Circle System Utilized Preoxygenation: Pre-oxygenation with 100% oxygen Induction Type: IV induction Laryngoscope Size: Mac and 4 Grade View: Grade II Tube type: Oral Tube size: 8.0 mm Number of attempts: 1 Airway Equipment and Method: Stylet Placement Confirmation: ETT inserted through vocal cords under direct vision,  positive ETCO2 and breath sounds checked- equal and bilateral Secured at: 19 cm Tube secured with: Tape Dental Injury: Teeth and Oropharynx as per pre-operative assessment

## 2018-12-28 ENCOUNTER — Inpatient Hospital Stay (HOSPITAL_COMMUNITY): Payer: Medicaid Other

## 2018-12-28 ENCOUNTER — Other Ambulatory Visit: Payer: Self-pay

## 2018-12-28 ENCOUNTER — Encounter (HOSPITAL_COMMUNITY): Payer: Self-pay | Admitting: Cardiothoracic Surgery

## 2018-12-28 LAB — POCT I-STAT 7, (LYTES, BLD GAS, ICA,H+H)
Acid-base deficit: 5 mmol/L — ABNORMAL HIGH (ref 0.0–2.0)
Acid-base deficit: 7 mmol/L — ABNORMAL HIGH (ref 0.0–2.0)
Bicarbonate: 20.3 mmol/L (ref 20.0–28.0)
Bicarbonate: 20.4 mmol/L (ref 20.0–28.0)
Calcium, Ion: 1.02 mmol/L — ABNORMAL LOW (ref 1.15–1.40)
Calcium, Ion: 1.05 mmol/L — ABNORMAL LOW (ref 1.15–1.40)
HCT: 25 % — ABNORMAL LOW (ref 39.0–52.0)
HCT: 26 % — ABNORMAL LOW (ref 39.0–52.0)
Hemoglobin: 8.5 g/dL — ABNORMAL LOW (ref 13.0–17.0)
Hemoglobin: 8.8 g/dL — ABNORMAL LOW (ref 13.0–17.0)
O2 Saturation: 88 %
O2 Saturation: 96 %
Patient temperature: 35.9
Patient temperature: 36.9
Potassium: 5.6 mmol/L — ABNORMAL HIGH (ref 3.5–5.1)
Potassium: 6 mmol/L — ABNORMAL HIGH (ref 3.5–5.1)
Sodium: 142 mmol/L (ref 135–145)
Sodium: 144 mmol/L (ref 135–145)
TCO2: 22 mmol/L (ref 22–32)
TCO2: 22 mmol/L (ref 22–32)
pCO2 arterial: 40.2 mmHg (ref 32.0–48.0)
pCO2 arterial: 45.3 mmHg (ref 32.0–48.0)
pH, Arterial: 7.253 — ABNORMAL LOW (ref 7.350–7.450)
pH, Arterial: 7.313 — ABNORMAL LOW (ref 7.350–7.450)
pO2, Arterial: 60 mmHg — ABNORMAL LOW (ref 83.0–108.0)
pO2, Arterial: 86 mmHg (ref 83.0–108.0)

## 2018-12-28 LAB — MAGNESIUM
Magnesium: 2 mg/dL (ref 1.7–2.4)
Magnesium: 1.9 mg/dL (ref 1.7–2.4)

## 2018-12-28 LAB — BASIC METABOLIC PANEL
Anion gap: 11 (ref 5–15)
Anion gap: 11 (ref 5–15)
BUN: 45 mg/dL — ABNORMAL HIGH (ref 6–20)
BUN: 47 mg/dL — ABNORMAL HIGH (ref 6–20)
CO2: 19 mmol/L — ABNORMAL LOW (ref 22–32)
CO2: 21 mmol/L — ABNORMAL LOW (ref 22–32)
Calcium: 7.1 mg/dL — ABNORMAL LOW (ref 8.9–10.3)
Calcium: 7.3 mg/dL — ABNORMAL LOW (ref 8.9–10.3)
Chloride: 113 mmol/L — ABNORMAL HIGH (ref 98–111)
Chloride: 112 mmol/L — ABNORMAL HIGH (ref 98–111)
Creatinine, Ser: 2.95 mg/dL — ABNORMAL HIGH (ref 0.61–1.24)
Creatinine, Ser: 3.76 mg/dL — ABNORMAL HIGH (ref 0.61–1.24)
GFR calc Af Amer: 26 mL/min — ABNORMAL LOW (ref >60)
GFR calc Af Amer: 19 mL/min — ABNORMAL LOW (ref >60)
GFR calc non Af Amer: 22 mL/min — ABNORMAL LOW (ref >60)
GFR calc non Af Amer: 16 mL/min — ABNORMAL LOW (ref >60)
Glucose, Bld: 111 mg/dL — ABNORMAL HIGH (ref 70–99)
Glucose, Bld: 128 mg/dL — ABNORMAL HIGH (ref 70–99)
Potassium: 6 mmol/L — ABNORMAL HIGH (ref 3.5–5.1)
Potassium: 6.1 mmol/L — ABNORMAL HIGH (ref 3.5–5.1)
Sodium: 143 mmol/L (ref 135–145)
Sodium: 144 mmol/L (ref 135–145)

## 2018-12-28 LAB — CBC
HCT: 26.9 % — ABNORMAL LOW (ref 39.0–52.0)
HCT: 28.8 % — ABNORMAL LOW (ref 39.0–52.0)
Hemoglobin: 8.3 g/dL — ABNORMAL LOW (ref 13.0–17.0)
Hemoglobin: 8.6 g/dL — ABNORMAL LOW (ref 13.0–17.0)
MCH: 28.1 pg (ref 26.0–34.0)
MCH: 28.1 pg (ref 26.0–34.0)
MCHC: 30.9 g/dL (ref 30.0–36.0)
MCHC: 29.9 g/dL — ABNORMAL LOW (ref 30.0–36.0)
MCV: 91.2 fL (ref 80.0–100.0)
MCV: 94.1 fL (ref 80.0–100.0)
Platelets: 203 10*3/uL (ref 150–400)
Platelets: 207 10*3/uL (ref 150–400)
RBC: 2.95 MIL/uL — ABNORMAL LOW (ref 4.22–5.81)
RBC: 3.06 MIL/uL — ABNORMAL LOW (ref 4.22–5.81)
RDW: 15.9 % — ABNORMAL HIGH (ref 11.5–15.5)
RDW: 16.1 % — ABNORMAL HIGH (ref 11.5–15.5)
WBC: 13 10*3/uL — ABNORMAL HIGH (ref 4.0–10.5)
WBC: 17.5 10*3/uL — ABNORMAL HIGH (ref 4.0–10.5)
nRBC: 0 % (ref 0.0–0.2)
nRBC: 0 % (ref 0.0–0.2)

## 2018-12-28 LAB — GLUCOSE, CAPILLARY
Glucose-Capillary: 104 mg/dL — ABNORMAL HIGH (ref 70–99)
Glucose-Capillary: 114 mg/dL — ABNORMAL HIGH (ref 70–99)
Glucose-Capillary: 115 mg/dL — ABNORMAL HIGH (ref 70–99)
Glucose-Capillary: 115 mg/dL — ABNORMAL HIGH (ref 70–99)
Glucose-Capillary: 116 mg/dL — ABNORMAL HIGH (ref 70–99)
Glucose-Capillary: 120 mg/dL — ABNORMAL HIGH (ref 70–99)
Glucose-Capillary: 121 mg/dL — ABNORMAL HIGH (ref 70–99)
Glucose-Capillary: 121 mg/dL — ABNORMAL HIGH (ref 70–99)
Glucose-Capillary: 121 mg/dL — ABNORMAL HIGH (ref 70–99)
Glucose-Capillary: 132 mg/dL — ABNORMAL HIGH (ref 70–99)
Glucose-Capillary: 134 mg/dL — ABNORMAL HIGH (ref 70–99)
Glucose-Capillary: 139 mg/dL — ABNORMAL HIGH (ref 70–99)
Glucose-Capillary: 149 mg/dL — ABNORMAL HIGH (ref 70–99)
Glucose-Capillary: 73 mg/dL (ref 70–99)
Glucose-Capillary: 97 mg/dL (ref 70–99)

## 2018-12-28 MED ORDER — CLOPIDOGREL BISULFATE 75 MG PO TABS
75.0000 mg | ORAL_TABLET | Freq: Every day | ORAL | Status: DC
  Administered 2018-12-28 – 2019-01-04 (×8): 75 mg via ORAL
  Filled 2018-12-28 (×9): qty 1

## 2018-12-28 MED ORDER — ISOSORBIDE DINITRATE 10 MG PO TABS
5.0000 mg | ORAL_TABLET | Freq: Three times a day (TID) | ORAL | Status: DC
  Administered 2018-12-28 – 2018-12-29 (×5): 5 mg via ORAL
  Filled 2018-12-28 (×6): qty 1

## 2018-12-28 MED ORDER — ASPIRIN EC 81 MG PO TBEC
81.0000 mg | DELAYED_RELEASE_TABLET | Freq: Every day | ORAL | Status: DC
  Administered 2018-12-29 – 2019-01-04 (×7): 81 mg via ORAL
  Filled 2018-12-28 (×8): qty 1

## 2018-12-28 MED ORDER — DIAZEPAM 2 MG PO TABS
2.0000 mg | ORAL_TABLET | Freq: Three times a day (TID) | ORAL | Status: DC | PRN
  Administered 2019-01-02: 2 mg via ORAL
  Filled 2018-12-28: qty 1

## 2018-12-28 MED ORDER — FUROSEMIDE 10 MG/ML IJ SOLN
4.0000 mg/h | INTRAVENOUS | Status: DC
  Filled 2018-12-28: qty 25

## 2018-12-28 MED ORDER — FUROSEMIDE 10 MG/ML IJ SOLN
4.0000 mg/h | INTRAVENOUS | Status: DC
  Administered 2018-12-28: 4 mg/h via INTRAVENOUS
  Filled 2018-12-28 (×2): qty 25

## 2018-12-28 MED ORDER — SODIUM CHLORIDE 0.9 % IV SOLN
1.5000 g | INTRAVENOUS | Status: AC
  Administered 2018-12-29: 1.5 g via INTRAVENOUS
  Filled 2018-12-28: qty 1.5

## 2018-12-28 MED ORDER — AMIODARONE HCL 200 MG PO TABS
400.0000 mg | ORAL_TABLET | Freq: Two times a day (BID) | ORAL | Status: DC
  Administered 2018-12-28 – 2019-01-04 (×16): 400 mg via ORAL
  Filled 2018-12-28 (×17): qty 2

## 2018-12-28 MED ORDER — FUROSEMIDE 10 MG/ML IJ SOLN
60.0000 mg | Freq: Once | INTRAMUSCULAR | Status: AC
  Administered 2018-12-28: 60 mg via INTRAVENOUS
  Filled 2018-12-28: qty 6

## 2018-12-28 MED ORDER — INFLUENZA VAC SPLIT QUAD 0.5 ML IM SUSY: 0.5000 mL | PREFILLED_SYRINGE | INTRAMUSCULAR | Status: DC | PRN

## 2018-12-28 MED ORDER — DOPAMINE-DEXTROSE 3.2-5 MG/ML-% IV SOLN
3.0000 ug/kg/min | INTRAVENOUS | Status: DC
  Administered 2018-12-28: 2.5 ug/kg/min via INTRAVENOUS
  Filled 2018-12-28: qty 250

## 2018-12-28 MED ORDER — PNEUMOCOCCAL VAC POLYVALENT 25 MCG/0.5ML IJ INJ
0.5000 mL | INJECTION | INTRAMUSCULAR | Status: AC | PRN
  Administered 2019-01-05: 0.5 mL via INTRAMUSCULAR
  Filled 2018-12-28: qty 0.5

## 2018-12-28 NOTE — Progress Notes (Signed)
Late Entry  Approx 1330 patient c/o SOB. On assessment, O2 sats 90% on 4L n/c and pt diaphoretic. C/o some chest discomfort/pressure at incision site. EKG obtained. Prn morphine administered - appeared effective after approx 15 minutes. Dr. Orvan Seen happened to be rounding on patient at this time. Orders for ABG - obtained and results shown to MD. Orders for one time dose of lasix. Placed on 6L hfnc. Will continue to monitor.  Joellen Jersey, RN

## 2018-12-28 NOTE — Anesthesia Postprocedure Evaluation (Signed)
Anesthesia Post Note  Patient: Anthony Hess  Procedure(s) Performed: CORONARY ARTERY BYPASS GRAFTING (CABG) X 4 ON PUMP USING RIGHT & LEFT INTERNAL MAMMARY ARTERY LEFT RADIAL ARTERY ENDOSCOPICALLY HARVESTED (N/A Chest) ENDOSCOPIC RADIAL ARTERY HARVEST (Left Arm Lower) TRANSESOPHAGEAL ECHOCARDIOGRAM (TEE) (N/A ) INDOCYANINE GREEN FLUORESCENCE IMAGING (ICG) (N/A )     Patient location during evaluation: SICU Anesthesia Type: General Level of consciousness: awake and alert Pain management: pain level controlled Vital Signs Assessment: post-procedure vital signs reviewed and stable Respiratory status: spontaneous breathing, nonlabored ventilation, respiratory function stable and patient connected to nasal cannula oxygen Cardiovascular status: blood pressure returned to baseline and stable Postop Assessment: no apparent nausea or vomiting Anesthetic complications: no    Last Vitals:  Vitals:   12/28/18 0900 12/28/18 0915  BP: 105/76   Pulse: 89 88  Resp: 19 13  Temp: (!) 36.1 C (!) 36.1 C  SpO2: 91% 97%    Last Pain:  Vitals:   12/28/18 0830  TempSrc: Core  PainSc: 7                  Effie Berkshire

## 2018-12-28 NOTE — Discharge Summary (Signed)
Physician Discharge Summary  Patient ID: Anthony Hess MRN: [id number] DOB/AGE: 05/25/1955 63 y.o.  Admit date: 12/27/2018 Discharge date: 01/05/2019  Admission Diagnoses:  Patient Active Problem List   Diagnosis Date Noted  . Emphysema lung ([place]) 04/14/2018  . Bilateral hydronephrosis 04/03/2018  . Essential hypertension 03/27/2018  . Cigarette smoker 03/20/2018  . History of bladder cancer 03/20/2018   Discharge Diagnoses:   Patient Active Problem List   Diagnosis Date Noted  . Atrial fibrillation ([place]) 01/05/2019  . Chronic kidney disease (CKD), stage IV (severe) ([place]) 01/05/2019  . 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 Emphysema lung ([place]) 04/14/2018  . Bilateral hydronephrosis 04/03/2018  . Essential hypertension 03/27/2018  . Cigarette smoker 03/20/2018  . History of bladder cancer 03/20/2018   Discharged Condition: good  History of Present Illness:  Anthony Hess is a 63 yo male with PMH of bladder cancer.  He was scheduled to undergo repeat bladder procedure when pre operative cardiac clearance was requested due to the patient complaining of frequent chest typically with exertion, but occasionally this occurred with rest.  He underwent a stress test which was positive.  He underwent cardiac catheterization on 12/20/2018 which showed 3V CAD.  It was felt coronary bypass grafting would be indicated.  He was referred to [facility] for evaluation.  He was evaluated by Dr. Orvan Seen at which time the patient denied current chest pain.  He experienced occasional shortness of breath with heavy exertion.  He is able to continue work.  It was felt coronary bypass grafting would be indicated.  The risks and benefits of the procedure were explained to the patient he was agreeable to proceed.  Hospital Course:   Anthony Hess presented to [facility] hospital on 12/27/2018.  He was taken to the operating room and underwent CABG x 4 utilizing LIMA to LAD, RIMA to PDA, and Sequential  radial artery to OM 1 and Diagonal.  He also underwent endoscopic harvest of left radial artery.  He tolerated the procedure without difficulty and was taken to the SICU in stable condition.  He was extubated the evening of surgery.  He was weaned off NTG, Amiodarone, Levophed, and Neo-synephrine as tolerated.  His chest tubes and arterial lines were removed without difficulty.  The patient developed non oliguric renal failure.  Nephrology consult was obtained, patient was started on Lasix drip.  His potassium level improved.  His creatinine level peaked at 6.08.  He did not require dialysis.  Nephrology managed his diuretics.The patient developed confusion with impulse control.  He got up and pulled out his central line.  He had mild post op delirium, which resolved with time. He remained alert and oriented but continued to require instruction on impulse. He initially required AAI pacing and had hypotension.  His BP improved and he was not on a BB or Imdur, as taken pre op. His bradycardia resolved. As of 11/12, his creatine is down to 5.01. PT was consulted to assist with ambulation. He was felt stable for transfer from the ICU to [facility] on 11/10. He does not have much appetite (no abdominal pain, nausea, or vomiting) but is trying to eat more. Epicardial pacing wires were removed on 11/11. He continued to ambulate more with PT assistance. His wounds are continuing to heal and there are no signs of infection. He has been ambulating on room air. He has had several bowel movements. He is felt surgically stable for discharge today.  Consults: nephrology  Significant Diagnostic  Studies: angiography:    1st Mrg lesion is 50% stenosed.  2nd Mrg lesion is 40% stenosed.  Ost LAD to Prox LAD lesion is 95% stenosed.  Ost Cx to Prox Cx lesion is 75% stenosed.  Mid LM lesion is 40% stenosed.  1st Diag lesion is 70% stenosed.  Mid RCA-1 lesion is 60% stenosed.  Mid RCA-2 lesion is 50% stenosed.   1.   Two-vessel coronary artery disease with complex 95% stenosis ostial/proximal LAD, 70% stenosis D1, 75% stenosis ostium left circumflex 2.  Moderate reduced left ventricular function with dilated cardiomyopathy, LVEF 30 to 35%  Treatments: surgery:    Coronary Artery Bypass Grafting x 4              Left Internal Mammary Artery to Distal Left Anterior Descending Coronary Artery; pedicled right IMA to Posterior Descending Coronary Artery ; left radial artery graft to OM1 and Diagonal artery as a sequential graft; endoscopic left radial artery harvesting; bilateral IMA harvesting; completion graft surveillance with indocyanine green fluorescence imaging (SPY)   Discharge Exam: Blood pressure 90/60, pulse 73, temperature 97.6 F (36.4 C), temperature source Oral, resp. rate 15, height 5\' 3"  (1.6 m), weight 54.4 kg, SpO2 98 %.   Physical Exam: General appearance: alert, cooperative and no distress Neurologic: intact Heart: regular rate and rhythm Lungs: Breath sounds clear. Extremities: No edema.  Wound: Sternotomy incision is healing well.   Discharge medications:   The patient has been discharged on:   1.Beta Blocker:  Yes [ X ]                              No   [   ]                              If No, reason:  2.Ace Inhibitor/ARB: Yes [   ]                                     No  [  x  ]                                     If No, reason:Elevated creatinine  3.Statin:   Yes [  x ]                  No  [   ]                  If No, reason:  4.Ecasa:  Yes  [ x  ]                  No   [   ]                  If No, reason:     Allergies as of 01/05/2019   No Known Allergies     Medication List    STOP taking these medications   amLODipine 10 MG tablet Commonly known as: NORVASC   isosorbide mononitrate 60 MG 24 hr tablet Commonly known as: IMDUR     TAKE these medications   amiodarone 200 MG tablet Commonly known as: PACERONE Take 1 tablet (200 mg total) by  mouth 2 (two) times daily.   aspirin 81 MG EC tablet Take 1 tablet (81 mg total) by mouth daily. Start taking on: January 06, 2019   atorvastatin 40 MG tablet Commonly known as: LIPITOR Take 1 tablet (40 mg total) by mouth daily at 6 PM.   clopidogrel 75 MG tablet Commonly known as: PLAVIX Take 1 tablet (75 mg total) by mouth daily. Start taking on: January 06, 2019   furosemide 80 MG tablet Commonly known as: LASIX Take 1 tablet (80 mg total) by mouth daily.   metoprolol succinate 25 MG 24 hr tablet Commonly known as: TOPROL-XL Take 1 tablet (25 mg total) by mouth daily.   nicotine 21 mg/24hr patch Commonly known as: NICODERM CQ - dosed in mg/24 hours Place 1 patch (21 mg total) onto the skin daily.   nicotine 14 mg/24hr patch Commonly known as: NICODERM CQ - dosed in mg/24 hours Place 1 patch (14 mg total) onto the skin daily.   sulfamethoxazole-trimethoprim 800-160 MG tablet Commonly known as: BACTRIM DS Take 1 tablet by mouth every 12 (twelve) hours.   traMADol 50 MG tablet Commonly known as: ULTRAM Take 1 tablet (50 mg total) by mouth every 4 (four) hours as needed for up to 5 days for moderate pain.            Durable Medical Equipment  (From admission, onward)         Start     Ordered   01/04/19 0825  For home use only DME Walker rolling  Once    Comments: Patient needs rolling walker with 5" rolls  Question:  Patient needs a walker to treat with the following condition  Answer:  Physical deconditioning   01/04/19 0824         Follow-up Information    [name], MD. Go on 01/12/2019.   Specialty: Cardiothoracic Surgery Why: Appointment time is at 2:30 pm Contact information: [place] [id number] [phone]        Isaias Cowman, MD Follow up on 01/08/2019.   Specialty: Cardiology Why: Appointment is at 11:00 Contact information: [facility] [place] [address] [phone]        Rexene Agent, MD. Call on 01/04/2019.   Specialty: Nephrology Why: for a follow up appointment 1-2 weeks regarding follow up of kidney function (AKI on CKD)  Contact information: [place] [phone]           Signed: Antony Odea PA-C 01/05/2019, 10:13 AM

## 2018-12-28 NOTE — Progress Notes (Signed)
EVENING ROUNDS NOTE :     [address]       [place] [address]             [phone]                 1 Day Post-Op Procedure(s) (LRB): CORONARY ARTERY BYPASS GRAFTING (CABG) X 4 ON PUMP USING RIGHT & LEFT INTERNAL MAMMARY ARTERY LEFT RADIAL ARTERY ENDOSCOPICALLY HARVESTED (N/A) ENDOSCOPIC RADIAL ARTERY HARVEST (Left) TRANSESOPHAGEAL ECHOCARDIOGRAM (TEE) (N/A) INDOCYANINE GREEN FLUORESCENCE IMAGING (ICG) (N/A)   Total Length of Stay:  LOS: 1 day  Events:  Remains on neo Remains on high flow     BP 107/83   Pulse (!) 25   Temp 97.7 F (36.5 C) (Oral)   Resp 13   Ht 5\' 3"  (1.6 m)   Wt 64.2 kg   SpO2 93%   BMI 25.07 kg/m   PAP: (25-49)/(9-27) 39/26 CVP:  [1 mmHg-21 mmHg] 19 mmHg CO:  [4.6 L/min-4.9 L/min] 4.6 L/min CI:  [2.8 L/min/m2-3 L/min/m2] 2.8 L/min/m2  Vent Mode: CPAP;PSV FiO2 (%):  [40 %-50 %] 40 % Set Rate:  [4 bmp-18 bmp] 4 bmp Vt Set:  [450 mL] 450 mL PEEP:  [5 cmH20] 5 cmH20 Pressure Support:  [10 cmH20] 10 cmH20 Plateau Pressure:  [0 cmH20] 0 cmH20  . [START ON 12/29/2018] cefUROXime (ZINACEF)  IV    . lactated ringers    . magnesium sulfate    . nitroGLYCERIN    . norepinephrine (LEVOPHED) Adult infusion Stopped (12/28/18 0111)  . phenylephrine (NEO-SYNEPHRINE) Adult infusion 30 mcg/min (12/28/18 1600)    I/O last 3 completed shifts: In: 5236.3 [I.V.:4286.2; Blood:200; IV Piggyback:750.1] Out: 6734 [Urine:2390; Blood:400; Chest Tube:970]   CBC Latest Ref Rng & Units 12/28/2018 12/28/2018 12/28/2018  WBC 4.0 - 10.5 K/uL - 13.0(H) -  Hemoglobin 13.0 - 17.0 g/dL 8.5(L) 8.3(L) 8.8(L)  Hematocrit 39.0 - 52.0 % 25.0(L) 26.9(L) 26.0(L)  Platelets 150 - 400 K/uL - 203 -    BMP Latest Ref Rng & Units 12/28/2018 12/28/2018 12/28/2018  Glucose 70 - 99 mg/dL - 111(H) -  BUN 6 - 20 mg/dL - 45(H) -  Creatinine 0.61 - 1.24 mg/dL - 2.95(H) -  BUN/Creat Ratio 10 - 24 - - -  Sodium 135 - 145 mmol/L 142 143 144  Potassium 3.5 - 5.1 mmol/L  6.0(H) 6.0(H) 5.6(H)  Chloride 98 - 111 mmol/L - 113(H) -  CO2 22 - 32 mmol/L - 19(L) -  Calcium 8.9 - 10.3 mg/dL - 7.1(L) -    ABG    Component Value Date/Time   PHART 7.253 (L) 12/28/2018 1430   PCO2ART 45.3 12/28/2018 1430   PO2ART 60.0 (L) 12/28/2018 1430   HCO3 20.3 12/28/2018 1430   TCO2 22 12/28/2018 1430   ACIDBASEDEF 7.0 (H) 12/28/2018 1430   O2SAT 88.0 12/28/2018 [place], MD 12/28/2018 4:54 PM

## 2018-12-28 NOTE — Plan of Care (Signed)
  Problem: Education: Goal: Will demonstrate proper wound care and an understanding of methods to prevent future damage Outcome: Progressing Goal: Knowledge of disease or condition will improve Outcome: Progressing Goal: Knowledge of the prescribed therapeutic regimen will improve Outcome: Progressing   Problem: Activity: Goal: Risk for activity intolerance will decrease Outcome: Progressing   Problem: Cardiac: Goal: Will achieve and/or maintain hemodynamic stability Outcome: Progressing   Problem: Clinical Measurements: Goal: Postoperative complications will be avoided or minimized Outcome: Progressing   Problem: Respiratory: Goal: Respiratory status will improve Outcome: Progressing

## 2018-12-28 NOTE — Progress Notes (Signed)
1 Day Post-Op Procedure(s) (LRB): CORONARY ARTERY BYPASS GRAFTING (CABG) X 4 ON PUMP USING RIGHT & LEFT INTERNAL MAMMARY ARTERY LEFT RADIAL ARTERY ENDOSCOPICALLY HARVESTED (N/A) ENDOSCOPIC RADIAL ARTERY HARVEST (Left) TRANSESOPHAGEAL ECHOCARDIOGRAM (TEE) (N/A) INDOCYANINE GREEN FLUORESCENCE IMAGING (ICG) (N/A) Subjective: One episode of sweating and "chest pain"  Objective: Vital signs in last 24 hours: Temp:  [96.4 F (35.8 C)-98.8 F (37.1 C)] 97.7 F (36.5 C) (11/05 1606) Pulse Rate:  [25-114] 25 (11/05 1615) Cardiac Rhythm: A-V Sequential paced (11/05 0830) Resp:  [9-33] 13 (11/05 1615) BP: (85-124)/(68-93) 107/83 (11/05 1615) SpO2:  [87 %-100 %] 93 % (11/05 1615) Arterial Line BP: (86-132)/(52-77) 102/63 (11/05 1445) FiO2 (%):  [40 %-50 %] 40 % (11/04 2211) Weight:  [64.2 kg] 64.2 kg (11/05 0600)  Hemodynamic parameters for last 24 hours: PAP: (25-49)/(9-27) 39/26 CVP:  [1 mmHg-21 mmHg] 19 mmHg CO:  [4.6 L/min-4.9 L/min] 4.6 L/min CI:  [2.8 L/min/m2-3 L/min/m2] 2.8 L/min/m2  Intake/Output from previous day: 11/04 0701 - 11/05 0700 In: 5236.3 [I.V.:4286.2; Blood:200; IV Piggyback:750.1] Out: 3760 [Urine:2390; Blood:400; Chest Tube:970] Intake/Output this shift: Total I/O In: 1327.5 [P.O.:480; I.V.:847.5] Out: 720 [Urine:390; Chest Tube:330]  General appearance: alert, cooperative and mild distress Neurologic: intact Heart: regular rate and rhythm, S1, S2 normal, no murmur, click, rub or gallop Lungs: rales bilaterally Abdomen: soft, non-tender; bowel sounds normal; no masses,  no organomegaly Extremities: edema 1+  Lab Results: Recent Labs    12/27/18 1540  12/28/18 0317 12/28/18 1430  WBC 13.6*  --  13.0*  --   HGB 8.2*   < > 8.3* 8.5*  HCT 27.2*   < > 26.9* 25.0*  PLT 236  --  203  --    < > = values in this interval not displayed.   BMET:  Recent Labs    12/27/18 1408  12/28/18 0317 12/28/18 1430  NA 144   < > 143 142  K 5.8*   < > 6.0* 6.0*  CL  111  --  113*  --   CO2  --   --  19*  --   GLUCOSE 75  --  111*  --   BUN 45*  --  45*  --   CREATININE 2.50*  --  2.95*  --   CALCIUM  --   --  7.1*  --    < > = values in this interval not displayed.    PT/INR:  Recent Labs    12/27/18 1540  LABPROT 15.7*  INR 1.3*   ABG    Component Value Date/Time   PHART 7.253 (L) 12/28/2018 1430   HCO3 20.3 12/28/2018 1430   TCO2 22 12/28/2018 1430   ACIDBASEDEF 7.0 (H) 12/28/2018 1430   O2SAT 88.0 12/28/2018 1430   CBG (last 3)  Recent Labs    12/28/18 1219 12/28/18 1328 12/28/18 1602  GLUCAP 73 132* 121*    Assessment/Plan: S/P Procedure(s) (LRB): CORONARY ARTERY BYPASS GRAFTING (CABG) X 4 ON PUMP USING RIGHT & LEFT INTERNAL MAMMARY ARTERY LEFT RADIAL ARTERY ENDOSCOPICALLY HARVESTED (N/A) ENDOSCOPIC RADIAL ARTERY HARVEST (Left) TRANSESOPHAGEAL ECHOCARDIOGRAM (TEE) (N/A) INDOCYANINE GREEN FLUORESCENCE IMAGING (ICG) (N/A) Mobilize Diuresis remove pa catheter; remove arterial line   LOS: 1 day    Anthony Hess 12/28/2018

## 2018-12-29 ENCOUNTER — Inpatient Hospital Stay (HOSPITAL_COMMUNITY): Payer: Medicaid Other

## 2018-12-29 LAB — GLUCOSE, CAPILLARY
Glucose-Capillary: 49 mg/dL — ABNORMAL LOW (ref 70–99)
Glucose-Capillary: 65 mg/dL — ABNORMAL LOW (ref 70–99)
Glucose-Capillary: 86 mg/dL (ref 70–99)
Glucose-Capillary: 109 mg/dL — ABNORMAL HIGH (ref 70–99)

## 2018-12-29 LAB — BASIC METABOLIC PANEL
Anion gap: 10 (ref 5–15)
BUN: 50 mg/dL — ABNORMAL HIGH (ref 6–20)
CO2: 20 mmol/L — ABNORMAL LOW (ref 22–32)
Calcium: 7 mg/dL — ABNORMAL LOW (ref 8.9–10.3)
Chloride: 109 mmol/L (ref 98–111)
Creatinine, Ser: 4.18 mg/dL — ABNORMAL HIGH (ref 0.61–1.24)
GFR calc Af Amer: 17 mL/min — ABNORMAL LOW (ref >60)
GFR calc non Af Amer: 14 mL/min — ABNORMAL LOW (ref >60)
Glucose, Bld: 125 mg/dL — ABNORMAL HIGH (ref 70–99)
Potassium: 6.3 mmol/L (ref 3.5–5.1)
Sodium: 139 mmol/L (ref 135–145)

## 2018-12-29 LAB — CBC WITH DIFFERENTIAL/PLATELET
Abs Immature Granulocytes: 0.08 10*3/uL — ABNORMAL HIGH (ref 0.00–0.07)
Basophils Absolute: 0 10*3/uL (ref 0.0–0.1)
Basophils Relative: 0 %
Eosinophils Absolute: 0.1 10*3/uL (ref 0.0–0.5)
Eosinophils Relative: 0 %
HCT: 28.7 % — ABNORMAL LOW (ref 39.0–52.0)
Hemoglobin: 8.4 g/dL — ABNORMAL LOW (ref 13.0–17.0)
Immature Granulocytes: 0 %
Lymphocytes Relative: 6 %
Lymphs Abs: 1.1 10*3/uL (ref 0.7–4.0)
MCH: 27.8 pg (ref 26.0–34.0)
MCHC: 29.3 g/dL — ABNORMAL LOW (ref 30.0–36.0)
MCV: 95 fL (ref 80.0–100.0)
Monocytes Absolute: 1.6 10*3/uL — ABNORMAL HIGH (ref 0.1–1.0)
Monocytes Relative: 9 %
Neutro Abs: 15.1 10*3/uL — ABNORMAL HIGH (ref 1.7–7.7)
Neutrophils Relative %: 85 %
Platelets: 190 10*3/uL (ref 150–400)
RBC: 3.02 MIL/uL — ABNORMAL LOW (ref 4.22–5.81)
RDW: 16.3 % — ABNORMAL HIGH (ref 11.5–15.5)
WBC: 17.9 10*3/uL — ABNORMAL HIGH (ref 4.0–10.5)
nRBC: 0 % (ref 0.0–0.2)

## 2018-12-29 MED ORDER — SODIUM POLYSTYRENE SULFONATE 15 GM/60ML PO SUSP
15.0000 g | Freq: Once | ORAL | Status: AC
  Administered 2018-12-29: 15 g via ORAL
  Filled 2018-12-29: qty 60

## 2018-12-29 MED FILL — Magnesium Sulfate Inj 50%: INTRAMUSCULAR | Qty: 10 | Status: AC

## 2018-12-29 MED FILL — Heparin Sodium (Porcine) Inj 1000 Unit/ML: INTRAMUSCULAR | Qty: 10 | Status: AC

## 2018-12-29 MED FILL — Mannitol IV Soln 20%: INTRAVENOUS | Qty: 500 | Status: AC

## 2018-12-29 MED FILL — Lidocaine HCl Local Soln Prefilled Syringe 100 MG/5ML (2%): INTRAMUSCULAR | Qty: 5 | Status: AC

## 2018-12-29 MED FILL — Heparin Sodium (Porcine) Inj 1000 Unit/ML: INTRAMUSCULAR | Qty: 30 | Status: AC

## 2018-12-29 MED FILL — Electrolyte-R (PH 7.4) Solution: INTRAVENOUS | Qty: 3000 | Status: AC

## 2018-12-29 MED FILL — Potassium Chloride Inj 2 mEq/ML: INTRAVENOUS | Qty: 40 | Status: AC

## 2018-12-29 MED FILL — Sodium Chloride IV Soln 0.9%: INTRAVENOUS | Qty: 2000 | Status: AC

## 2018-12-29 MED FILL — Sodium Bicarbonate IV Soln 8.4%: INTRAVENOUS | Qty: 50 | Status: AC

## 2018-12-29 NOTE — Progress Notes (Signed)
      [address]       [place] [address]             [phone]      POD # 2 CABG  Just finished eating BP 101/73   Pulse 79   Temp 97.8 F (36.6 C)   Resp 12   Ht 5\' 3"  (1.6 m)   Wt 65.6 kg   SpO2 100%   BMI 25.62 kg/m   On HF [place] at 6L/min- 100% sat Dopamine at 3, lasix at 4, off neo  Intake/Output Summary (Last 24 hours) at 12/29/2018 1701 Last data filed at 12/29/2018 1600 Gross per 24 hour  Intake 1472.78 ml  Output 1860 ml  Net -387.22 ml   Hypoglycemia- improved PM labs pending  Remo Lipps C. Roxan Hockey, MD [facility] [phone]

## 2018-12-30 ENCOUNTER — Encounter (HOSPITAL_COMMUNITY): Payer: Self-pay | Admitting: Nephrology

## 2018-12-30 ENCOUNTER — Inpatient Hospital Stay (HOSPITAL_COMMUNITY): Payer: Medicaid Other

## 2018-12-30 LAB — GLUCOSE, CAPILLARY
Glucose-Capillary: 104 mg/dL — ABNORMAL HIGH (ref 70–99)
Glucose-Capillary: 104 mg/dL — ABNORMAL HIGH (ref 70–99)
Glucose-Capillary: 130 mg/dL — ABNORMAL HIGH (ref 70–99)

## 2018-12-30 LAB — CBC WITH DIFFERENTIAL/PLATELET
Abs Immature Granulocytes: 0.06 10*3/uL (ref 0.00–0.07)
Basophils Absolute: 0 10*3/uL (ref 0.0–0.1)
Basophils Relative: 0 %
Eosinophils Absolute: 0 10*3/uL (ref 0.0–0.5)
Eosinophils Relative: 0 %
HCT: 27.1 % — ABNORMAL LOW (ref 39.0–52.0)
Hemoglobin: 7.9 g/dL — ABNORMAL LOW (ref 13.0–17.0)
Immature Granulocytes: 1 %
Lymphocytes Relative: 5 %
Lymphs Abs: 0.5 10*3/uL — ABNORMAL LOW (ref 0.7–4.0)
MCH: 27.5 pg (ref 26.0–34.0)
MCHC: 29.2 g/dL — ABNORMAL LOW (ref 30.0–36.0)
MCV: 94.4 fL (ref 80.0–100.0)
Monocytes Absolute: 0.8 10*3/uL (ref 0.1–1.0)
Monocytes Relative: 8 %
Neutro Abs: 9.7 10*3/uL — ABNORMAL HIGH (ref 1.7–7.7)
Neutrophils Relative %: 86 %
Platelets: 183 10*3/uL (ref 150–400)
RBC: 2.87 MIL/uL — ABNORMAL LOW (ref 4.22–5.81)
RDW: 16.1 % — ABNORMAL HIGH (ref 11.5–15.5)
WBC: 11.2 10*3/uL — ABNORMAL HIGH (ref 4.0–10.5)
nRBC: 0 % (ref 0.0–0.2)

## 2018-12-30 LAB — SODIUM, URINE, RANDOM: Sodium, Ur: 27 mmol/L

## 2018-12-30 LAB — BASIC METABOLIC PANEL
Anion gap: 11 (ref 5–15)
BUN: 65 mg/dL — ABNORMAL HIGH (ref 6–20)
CO2: 21 mmol/L — ABNORMAL LOW (ref 22–32)
Calcium: 6.7 mg/dL — ABNORMAL LOW (ref 8.9–10.3)
Chloride: 108 mmol/L (ref 98–111)
Creatinine, Ser: 5.27 mg/dL — ABNORMAL HIGH (ref 0.61–1.24)
GFR calc Af Amer: 13 mL/min — ABNORMAL LOW (ref >60)
GFR calc non Af Amer: 11 mL/min — ABNORMAL LOW (ref >60)
Glucose, Bld: 114 mg/dL — ABNORMAL HIGH (ref 70–99)
Potassium: 6.1 mmol/L — ABNORMAL HIGH (ref 3.5–5.1)
Sodium: 140 mmol/L (ref 135–145)

## 2018-12-30 LAB — CREATININE, URINE, RANDOM: Creatinine, Urine: 70.41 mg/dL

## 2018-12-30 MED ORDER — SODIUM CHLORIDE 0.9 % IV SOLN
INTRAVENOUS | Status: DC | PRN
  Administered 2018-12-30: 16:00:00 250 mL via INTRAVENOUS

## 2018-12-30 MED ORDER — SODIUM ZIRCONIUM CYCLOSILICATE 10 G PO PACK
10.0000 g | PACK | Freq: Three times a day (TID) | ORAL | Status: AC
  Administered 2018-12-30 (×2): 10 g via ORAL
  Filled 2018-12-30 (×3): qty 1

## 2018-12-30 MED ORDER — THIAMINE HCL 100 MG/ML IJ SOLN
Freq: Once | INTRAVENOUS | Status: AC
  Administered 2018-12-30: 17:00:00 via INTRAVENOUS
  Filled 2018-12-30: qty 1000

## 2018-12-30 MED ORDER — FUROSEMIDE 10 MG/ML IJ SOLN
80.0000 mg | Freq: Three times a day (TID) | INTRAMUSCULAR | Status: DC
  Administered 2018-12-30 – 2019-01-02 (×8): 80 mg via INTRAVENOUS
  Filled 2018-12-30 (×10): qty 8

## 2018-12-30 NOTE — Progress Notes (Signed)
Pt non compliant with lokelma ordered TID for hyperkalemia.... will only take small sips this evening.... Per day shift, he did not take lokelma for them.... Will continue to encourage pt .... education given on importance of adhering to medication regimen while in ICU... pt verb understanding.... Will cont to monitor pt closely.

## 2018-12-30 NOTE — Consult Note (Signed)
Renal Service Consult Note [facility]  Anthony Hess 12/30/2018 Anthony Hess Requesting Physician:  Dr Orvan Seen, B.   Reason for Consult:  Renal failure HPI: The patient is a 63 y.o. year-old w/ hx of HTN, CAD, CKD IV and bladder Ca referred to cardiologist for frequent chest pain needing pre-op cardiac clearance for a repeat bladder procedure.  Had +stress test and then [facility] on 10/28 in [place] which showed severe 3VCAD. Pt was seen by CTS and admitted and underwent CABG elective surg on 12/27/18.  Creat was 2.90 on admission and has risen to 4.28 yesterday and 5.27 today. Asked to see for renal failure.    UOP was 2.4 on 11/4, 1.2 L on 11/5, 875 cc on 11/6.  Pt has had some postop hypotension , on neo gtt for 48hrs then weaned off. On Neo gtt for <24 hrs then weaned off. Some postop amio IV also weaned off. Dopamine 2.5 started on 11/5. Lasix IV at 28m /hr was started on 11/5.  Rec'd IV vanc 1250 mg on 11/4, also 3gm vanc made into paste for sternum 11/4 and 1gm for similar. No nsaids , no contrast given. Now is getting po amio 400 bid, ecasa 81, plavix, colace, isordil 5 tid, metoprolol 12.5 bid (most doses held for low BP, got one last night), PPI qd, IV dopamine, IV lasix 4 mg /hr  [place]. He is poor for details today.  Denies any SOB.  Abdomen / feet and hands feel swollen.  Hasn't been OOB yet.       Date  Creat  eGFR  Feb 2020 2.52 - 3.31 19- 19 May 2018 2.95  13 Sep 2018 2.79  16 Dec 2018 2.64- 2.84 23-25 ml /min   ROS  denies CP  no joint pain   no HA  no blurry vision  no rash  no diarrhea  no nausea/ vomiting     Past Medical History  Past Medical History:  Diagnosis Date  . Bladder tumor   . Cancer ([facility]    Bladder  . Chronic kidney disease    renal insufficiency  . Coronary artery disease   . Hypertension   . Myocardial infarction ([place]   . Wears glasses    Past Surgical  History  Past Surgical History:  Procedure Laterality Date  . CORONARY ARTERY BYPASS GRAFT N/A 12/27/2018   Procedure: CORONARY ARTERY BYPASS GRAFTING (CABG) X 4 ON PUMP USING RIGHT & LEFT INTERNAL MAMMARY ARTERY LEFT RADIAL ARTERY ENDOSCOPICALLY HARVESTED;  Surgeon: AWonda Olds MD;  [place]  Service: Open Heart Surgery;  Laterality: N/A;  . LEFT HEART CATH AND CORONARY ANGIOGRAPHY Left 12/20/2018   Procedure: LEFT HEART CATH AND CORONARY ANGIOGRAPHY;  Surgeon: PIsaias Cowman MD;  [facility] [facility];  Service: Cardiovascular;  Laterality: Left;  . RADIAL ARTERY HARVEST Left 12/27/2018   Procedure: ENDOSCOPIC RADIAL ARTERY HARVEST;  Surgeon: AWonda Olds MD;  [place] [facility]  Service: Open Heart Surgery;  Laterality: Left;  . TEE WITHOUT CARDIOVERSION N/A 12/27/2018   Procedure: TRANSESOPHAGEAL ECHOCARDIOGRAM (TEE);  Surgeon: AWonda Olds MD;  [place] [facility]  Service: Open Heart Surgery;  Laterality: N/A;  . TUMOR REMOVAL  2019   Bladder   Family History  Family History  Family history unknown: Yes   Social History  reports that he has been smoking cigarettes. He has been smoking  about 0.25 packs per day. He has never used smokeless tobacco. He reports that he does not drink alcohol or use drugs. Allergies No Known Allergies Home medications Prior to Admission medications   Medication Sig Start Date End Date Taking? Authorizing Provider  amLODipine (NORVASC) 10 MG tablet Take 1 tablet (10 mg total) by mouth daily. 11/30/18  Yes Volney American, PA-C  isosorbide mononitrate (IMDUR) 60 MG 24 hr tablet Take 60 mg by mouth 2 (two) times daily.    Yes [provider]  metoprolol succinate (TOPROL-XL) 25 MG 24 hr tablet Take 1 tablet (25 mg total) by mouth daily. 11/30/18  Yes Volney American, PA-C  nicotine (NICODERM CQ - DOSED IN MG/24 HOURS) 14 mg/24hr patch Place 1 patch (14 mg total) onto the skin daily. 12/21/18 12/21/19 Yes  Atkins, Glenice Bow, MD  nicotine (NICODERM CQ - DOSED IN MG/24 HOURS) 21 mg/24hr patch Place 1 patch (21 mg total) onto the skin daily. Patient not taking: Reported on 12/19/2018 03/20/18   Volney American, PA-C  sulfamethoxazole-trimethoprim (BACTRIM DS) 800-160 MG tablet Take 1 tablet by mouth every 12 (twelve) hours. Patient not taking: Reported on 12/20/2018 12/16/18   Abbie Sons, MD   Liver Function Tests Recent Labs  Lab 12/25/18 1220  AST 17  ALT 23  ALKPHOS 64  BILITOT 0.3  PROT 6.0*  ALBUMIN 3.2*   No results for input(s): LIPASE, AMYLASE in the last 168 hours. CBC Recent Labs  Lab 12/28/18 1830 12/29/18 0410 12/30/18 0451  WBC 17.5* 17.9* 11.2*  NEUTROABS  --  15.1* 9.7*  HGB 8.6* 8.4* 7.9*  HCT 28.8* 28.7* 27.1*  MCV 94.1 95.0 94.4  PLT 207 190 081   Basic Metabolic Panel Recent Labs  Lab 12/25/18 1220  12/27/18 1300 12/27/18 1336 12/27/18 1408  12/27/18 2231 12/28/18 0028 12/28/18 0317 12/28/18 1430 12/28/18 1830 12/29/18 0410 12/30/18 0451  NA 140   < > 139 142 144   < > 143 144 143 142 144 139 140  K 5.2*   < > 7.2* 7.2* 5.8*   < > 6.0* 5.6* 6.0* 6.0* 6.1* 6.3* 6.1*  CL 117*   < > 110 111 111  --   --   --  113*  --  112* 109 108  CO2 14*  --   --   --   --   --   --   --  19*  --  21* 20* 21*  GLUCOSE 142*   < > 153* 133* 75  --   --   --  111*  --  128* 125* 114*  BUN 57*   < > 45* 42* 45*  --   --   --  45*  --  47* 50* 65*  CREATININE 2.90*   < > 2.80* 2.80* 2.50*  --   --   --  2.95*  --  3.76* 4.18* 5.27*  CALCIUM 7.6*  --   --   --   --   --   --   --  7.1*  --  7.3* 7.0* 6.7*   < > = values in this interval not displayed.   Iron/TIBC/Ferritin/ %Sat No results found for: IRON, TIBC, FERRITIN, IRONPCTSAT  Vitals:   12/30/18 1200 12/30/18 1300 12/30/18 1400 12/30/18 1500  BP: _0 93/61  Pulse: 80 80 80   Resp: 11 10 (!) 8 (!) 9  Temp:      TempSrc:  SpO2: 98% 98% 96%   Weight:      Height:         Exam Gen alert, lying flat, no distress, nasal O2 No rash, cyanosis or gangrene Sclera anicteric, throat clear  +JVD to angle of jaw Chest clear bilat rales R > L at bases RRR no MRG, sternotomy scar and dressing Abd soft , nontender, slighlty distended, no mass or hsm GU normal male w/ foley draining normal appearing urine   MS no joint effusions or deformity Ext bilat pitting dependent hip edema 2+ , no wounds or ulcers Neuro is alert, Ox 3 , nf    Home meds:  - amlodipine 10 qd/ metoprolol xl 25  - isosorbide mononitrate 60  - nicotine patch/ bactrim DS bid   UA 11/2 - >50 wbc/ 0-5 rbc, few bact, 1.010, large LE/ +nitrite, 100 prot  CXR 11/2 - normal  CXR  11/4- 11/7 > vasc congestion + IS edema mild pattern  I/o since admission = 8.3L in and 7.6 L out  Creat 11/2 = 2.90 > 2.95 on 11/5 > 4.18 yest >  5.27 today   Assessment/ Plan: 1. AKI on CKD IV - baseline eGFR 77m/min , baseline creat 2.5- 2.9. AKI here could be hypoperfusion from hypotension, decomp HF/ vol ^, ATN from day-of-surg IV + intraop paste vanc toxicity (unlikely but will check vanc level), or a combination, or other. Looks volume up, UOP declining. Will add bolus lasix 80 mg IV tid. Will dc isordil and metoprolol po for now.  May require dialysis in the next 2- 3 days, have d/w pt and questions answered.  2. 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 Hypotension - bp's too low for optimal renal perfusion, would recommend holding any BP lowering meds for now (nitrates, BB) and let BP's come up some > 110 preferably.  Have d/w primary 4. Hyperkalemia - lokelma ordered tid until better, and renal diet 5. Vol excess - has some early CHF on all post op films, rales/ jvd and some edema. Not real symptomatic. As above      RKelly Splinter MD 12/30/2018, 4:26 PM

## 2018-12-30 NOTE — Progress Notes (Signed)
Summary- Pt labs drawn from [facility]- pt agreeable to ambulation. Pt agreed to wait 15 minutes for lab draw from 2nd pt. When room entered to prepare to walk, pt noted with legs over the edge of the bed. When room fully illuminated, blood noted at right wrist and left neck. Pt had self DC'ed his IV access and the introducer in his left neck. Small hematoma noted at neck site, pressure applied and call sent out for assistance. New IV, # 20 , started by Gwyndolyn Saxon, Agricultural consultant. After pressure held, sutures were removed and dressing applied to site. Dr Roxan Hockey informed of pt status, instruction rec'd to stop dopamine gtt and to continue to infuse the lasix gtt.

## 2018-12-30 NOTE — Progress Notes (Signed)
Pt remains alert since removing his IV access, but thinks he is in a garage, despite frequent re-orientation. Pt knows he had heart surgery, the date, month and year, but is now confused to location. No physical manifestations of neurologic impairment assessed.

## 2018-12-30 NOTE — Progress Notes (Signed)
      [address]       [place] [address]             [phone]      Still some confusion BP 102/77   Pulse 80   Temp 98.2 F (36.8 C) (Oral)   Resp 10   Ht 5\' 3"  (1.6 m)   Wt 65.6 kg   SpO2 96%   BMI 25.62 kg/m    Intake/Output Summary (Last 24 hours) at 12/30/2018 1725 Last data filed at 12/30/2018 1300 Gross per 24 hour  Intake 300.68 ml  Output 1320 ml  Net -1019.32 ml   340 of UO since 0700 Seen by Nephrology Hopefully can avoid dialysis Will hold Imdur and beta blocker in effort to increase renal perfusion Also increase AAI to 90  Felicidad Sugarman C. Roxan Hockey, MD [facility] [phone]

## 2018-12-30 NOTE — Progress Notes (Signed)
3 Days Post-Op Procedure(s) (LRB): CORONARY ARTERY BYPASS GRAFTING (CABG) X 4 ON PUMP USING RIGHT & LEFT INTERNAL MAMMARY ARTERY LEFT RADIAL ARTERY ENDOSCOPICALLY HARVESTED (N/A) ENDOSCOPIC RADIAL ARTERY HARVEST (Left) TRANSESOPHAGEAL ECHOCARDIOGRAM (TEE) (N/A) INDOCYANINE GREEN FLUORESCENCE IMAGING (ICG) (N/A) Subjective: Denies pain. Some confusion, but answers some questions appropriately Got out of bed last night and pulled out central line in the process  Objective: Vital signs in last 24 hours: Temp:  [97.8 F (36.6 C)-98.3 F (36.8 C)] 98.3 F (36.8 C) (11/07 0403) Pulse Rate:  [38-116] 80 (11/07 0900) Cardiac Rhythm: Atrial paced (11/07 0800) Resp:  [8-22] 11 (11/07 0900) BP: (81-112)/(55-82) 100/72 (11/07 0900) SpO2:  [20 %-100 %] 98 % (11/07 0900)  Hemodynamic parameters for last 24 hours:    Intake/Output from previous day: 11/06 0701 - 11/07 0700 In: 769.5 [P.O.:540; I.V.:229.5] Out: 1270 [Urine:845; Chest Tube:425] Intake/Output this shift: Total I/O In: 3.9 [I.V.:3.9] Out: 100 [Urine:50; Chest Tube:50]  General appearance: alert, cooperative, no distress and confused but easily reoriented Neurologic: no focal deficit Heart: regular rate and rhythm Lungs: diminished breath sounds bibasilar Abdomen: normal findings: soft, non-tender  Lab Results: Recent Labs    12/29/18 0410 12/30/18 0451  WBC 17.9* 11.2*  HGB 8.4* 7.9*  HCT 28.7* 27.1*  PLT 190 183   BMET:  Recent Labs    12/29/18 0410 12/30/18 0451  NA 139 140  K 6.3* 6.1*  CL 109 108  CO2 20* 21*  GLUCOSE 125* 114*  BUN 50* 65*  CREATININE 4.18* 5.27*  CALCIUM 7.0* 6.7*    PT/INR:  Recent Labs    12/27/18 1540  LABPROT 15.7*  INR 1.3*   ABG    Component Value Date/Time   PHART 7.253 (L) 12/28/2018 1430   HCO3 20.3 12/28/2018 1430   TCO2 22 12/28/2018 1430   ACIDBASEDEF 7.0 (H) 12/28/2018 1430   O2SAT 88.0 12/28/2018 1430   CBG (last 3)  Recent Labs    12/30/18 0013  12/30/18 0402 12/30/18 0920  GLUCAP 104* 104* 130*    Assessment/Plan: S/P Procedure(s) (LRB): CORONARY ARTERY BYPASS GRAFTING (CABG) X 4 ON PUMP USING RIGHT & LEFT INTERNAL MAMMARY ARTERY LEFT RADIAL ARTERY ENDOSCOPICALLY HARVESTED (N/A) ENDOSCOPIC RADIAL ARTERY HARVEST (Left) TRANSESOPHAGEAL ECHOCARDIOGRAM (TEE) (N/A) INDOCYANINE GREEN FLUORESCENCE IMAGING (ICG) (N/A) -POD # 3 CABG CV- in SR, BP relatively low but no significant change after dopamine stopped RESP- Continue IS RENAL- creatinine continues to rise. Nonoliguric acute on chronic renal failure  Dopamine stopped after central access lost. Continue with lasix drip for now  Will ask Nephrology to consult, hyperkalemia persists ENDO- CBG well controlled NEURO- mild postop delirium Dc chest tubes   LOS: 3 days    Melrose Nakayama 12/30/2018

## 2018-12-31 ENCOUNTER — Inpatient Hospital Stay (HOSPITAL_COMMUNITY): Payer: Medicaid Other

## 2018-12-31 LAB — CBC WITH DIFFERENTIAL/PLATELET
Abs Immature Granulocytes: 0.07 10*3/uL (ref 0.00–0.07)
Basophils Absolute: 0 10*3/uL (ref 0.0–0.1)
Basophils Relative: 0 %
Eosinophils Absolute: 0.1 10*3/uL (ref 0.0–0.5)
Eosinophils Relative: 1 %
HCT: 28.1 % — ABNORMAL LOW (ref 39.0–52.0)
Hemoglobin: 8.6 g/dL — ABNORMAL LOW (ref 13.0–17.0)
Immature Granulocytes: 1 %
Lymphocytes Relative: 6 %
Lymphs Abs: 0.6 10*3/uL — ABNORMAL LOW (ref 0.7–4.0)
MCH: 28 pg (ref 26.0–34.0)
MCHC: 30.6 g/dL (ref 30.0–36.0)
MCV: 91.5 fL (ref 80.0–100.0)
Monocytes Absolute: 1 10*3/uL (ref 0.1–1.0)
Monocytes Relative: 10 %
Neutro Abs: 7.8 10*3/uL — ABNORMAL HIGH (ref 1.7–7.7)
Neutrophils Relative %: 82 %
Platelets: 188 10*3/uL (ref 150–400)
RBC: 3.07 MIL/uL — ABNORMAL LOW (ref 4.22–5.81)
RDW: 15.9 % — ABNORMAL HIGH (ref 11.5–15.5)
WBC: 9.5 10*3/uL (ref 4.0–10.5)
nRBC: 0 % (ref 0.0–0.2)

## 2018-12-31 LAB — COMPREHENSIVE METABOLIC PANEL
ALT: 24 U/L (ref 0–44)
AST: 30 U/L (ref 15–41)
Albumin: 2.3 g/dL — ABNORMAL LOW (ref 3.5–5.0)
Alkaline Phosphatase: 68 U/L (ref 38–126)
Anion gap: 17 — ABNORMAL HIGH (ref 5–15)
BUN: 87 mg/dL — ABNORMAL HIGH (ref 6–20)
CO2: 19 mmol/L — ABNORMAL LOW (ref 22–32)
Calcium: 6.2 mg/dL — CL (ref 8.9–10.3)
Chloride: 106 mmol/L (ref 98–111)
Creatinine, Ser: 5.89 mg/dL — ABNORMAL HIGH (ref 0.61–1.24)
GFR calc Af Amer: 11 mL/min — ABNORMAL LOW (ref >60)
GFR calc non Af Amer: 10 mL/min — ABNORMAL LOW (ref >60)
Glucose, Bld: 91 mg/dL (ref 70–99)
Potassium: 5.2 mmol/L — ABNORMAL HIGH (ref 3.5–5.1)
Sodium: 142 mmol/L (ref 135–145)
Total Bilirubin: 0.6 mg/dL (ref 0.3–1.2)
Total Protein: 5.6 g/dL — ABNORMAL LOW (ref 6.5–8.1)

## 2018-12-31 LAB — GLUCOSE, CAPILLARY: Glucose-Capillary: 126 mg/dL — ABNORMAL HIGH (ref 70–99)

## 2018-12-31 LAB — VANCOMYCIN, RANDOM: Vancomycin Rm: 18

## 2018-12-31 MED ORDER — AMIODARONE IV BOLUS ONLY 150 MG/100ML: 150.0000 mg | Freq: Once | INTRAVENOUS | Status: DC

## 2018-12-31 NOTE — Plan of Care (Signed)
  Problem: Cardiac: Goal: Will achieve and/or maintain hemodynamic stability Outcome: Progressing   Problem: Clinical Measurements: Goal: Postoperative complications will be avoided or minimized Outcome: Progressing   Problem: Respiratory: Goal: Respiratory status will improve Outcome: Progressing   Problem: Skin Integrity: Goal: Wound healing without signs and symptoms of infection Outcome: Progressing   Problem: Clinical Measurements: Goal: Will remain free from infection Outcome: Progressing   Problem: Elimination: Goal: Will not experience complications related to urinary retention Outcome: Progressing   Problem: Safety: Goal: Ability to remain free from injury will improve Outcome: Progressing   Problem: Education: Goal: Will demonstrate proper wound care and an understanding of methods to prevent future damage Outcome: Not Progressing Goal: Knowledge of disease or condition will improve Outcome: Not Progressing Goal: Knowledge of the prescribed therapeutic regimen will improve Outcome: Not Progressing   Problem: Activity: Goal: Risk for activity intolerance will decrease Outcome: Not Progressing   Problem: Skin Integrity: Goal: Risk for impaired skin integrity will decrease Outcome: Not Progressing   Problem: Clinical Measurements: Goal: Diagnostic test results will improve Outcome: Not Progressing   Problem: Activity: Goal: Risk for activity intolerance will decrease Outcome: Not Progressing

## 2018-12-31 NOTE — Progress Notes (Addendum)
4 Days Post-Op Procedure(s) (LRB): CORONARY ARTERY BYPASS GRAFTING (CABG) X 4 ON PUMP USING RIGHT & LEFT INTERNAL MAMMARY ARTERY LEFT RADIAL ARTERY ENDOSCOPICALLY HARVESTED (N/A) ENDOSCOPIC RADIAL ARTERY HARVEST (Left) TRANSESOPHAGEAL ECHOCARDIOGRAM (TEE) (N/A) INDOCYANINE GREEN FLUORESCENCE IMAGING (ICG) (N/A) Subjective: Refused lab draws earlier, but were finally done Denies pain Aware he is hospital and had bypass surgery. Impulse control remains a problem  Objective: Vital signs in last 24 hours: Temp:  [97.9 F (36.6 C)-98.2 F (36.8 C)] 98.2 F (36.8 C) (11/08 0829) Pulse Rate:  [46-90] 89 (11/08 0800) Cardiac Rhythm: Atrial paced (11/08 0800) Resp:  [7-16] 12 (11/08 0800) BP: (80-110)/(58-81) 95/72 (11/08 0800) SpO2:  [96 %-100 %] 100 % (11/08 0800) Weight:  [65.3 kg] 65.3 kg (11/08 0500)  Hemodynamic parameters for last 24 hours:    Intake/Output from previous day: 11/07 0701 - 11/08 0700 In: 649.9 [I.V.:649.9] Out: 1580 [Urine:1530; Chest Tube:50] Intake/Output this shift: Total I/O In: 4 [I.V.:4] Out: 200 [Urine:200]  General appearance: alert, no distress and intermittently cooperative Neurologic: no focal deficit Heart: regular rate and rhythm Lungs: diminished breath sounds bibasilar Abdomen: normal findings: soft, non-tender  Lab Results: Recent Labs    12/29/18 0410 12/30/18 0451  WBC 17.9* 11.2*  HGB 8.4* 7.9*  HCT 28.7* 27.1*  PLT 190 183   BMET:  Recent Labs    12/29/18 0410 12/30/18 0451  NA 139 140  K 6.3* 6.1*  CL 109 108  CO2 20* 21*  GLUCOSE 125* 114*  BUN 50* 65*  CREATININE 4.18* 5.27*  CALCIUM 7.0* 6.7*    PT/INR: No results for input(s): LABPROT, INR in the last 72 hours. ABG    Component Value Date/Time   PHART 7.253 (L) 12/28/2018 1430   HCO3 20.3 12/28/2018 1430   TCO2 22 12/28/2018 1430   ACIDBASEDEF 7.0 (H) 12/28/2018 1430   O2SAT 88.0 12/28/2018 1430   CBG (last 3)  Recent Labs    12/30/18 0013  12/30/18 0402 12/30/18 0920  GLUCAP 104* 104* 130*    Assessment/Plan: S/P Procedure(s) (LRB): CORONARY ARTERY BYPASS GRAFTING (CABG) X 4 ON PUMP USING RIGHT & LEFT INTERNAL MAMMARY ARTERY LEFT RADIAL ARTERY ENDOSCOPICALLY HARVESTED (N/A) ENDOSCOPIC RADIAL ARTERY HARVEST (Left) TRANSESOPHAGEAL ECHOCARDIOGRAM (TEE) (N/A) INDOCYANINE GREEN FLUORESCENCE IMAGING (ICG) (N/A) -CV- in SR, BP better with beta blocker and Imdur held RESP- continue IS RENAL- relatively good UO. Labs pending ENDO= CBG well controlled Gi- Tolerating diet Continue cardiac rehab   LOS: 4 days    Melrose Nakayama 12/31/2018  Non oliguric renal failure- Creatinine up to 5.89, K better at 5.2  Remo Lipps C. Roxan Hockey, MD [facility] [phone]

## 2018-12-31 NOTE — Progress Notes (Signed)
Patient has refused lab draw twice this morning. He is very non-compliant and refuses to get his lab drawn till he can speak to his wife. RN called wife Derrek Monaco) three times this morning but it went straight to voicemail and her voicemail is also full. RN could not leave a voicemail. RN educated patient on the need for his labs to be drawn this morning especially when his potassium was elevated yesterday. Patient verbalized understanding but still refused staff from getting his labs. Will keep trying to reach wife.  RN also attempted ambulating patient this morning and patient refused to walk around unit. Will continue to monitor.

## 2018-12-31 NOTE — Progress Notes (Signed)
[facility] Progress Note  Subjective: pt w/o new c/o's, confused but pleasant, can't remember how long has been here, no SOB or cough  Vitals:   12/31/18 0900 12/31/18 0945 12/31/18 1000 12/31/18 1015  BP: 115/87   107/75  Pulse: 88  66 68  Resp: (!) _0 Temp:      TempSrc:      SpO2: 95%  100% 98%  Weight:      Height:        Inpatient medications: . acetaminophen  1,000 mg Oral Q6H   Or  . acetaminophen (TYLENOL) oral liquid 160 mg/5 mL  1,000 mg Per Tube Q6H  . amiodarone  400 mg Oral BID  . aspirin EC  81 mg Oral Daily  . bisacodyl  10 mg Oral Daily   Or  . bisacodyl  10 mg Rectal Daily  . Chlorhexidine Gluconate Cloth  6 each Topical Daily  . clopidogrel  75 mg Oral Daily  . docusate sodium  200 mg Oral Daily  . furosemide  80 mg Intravenous TID  . mouth rinse  15 mL Mouth Rinse BID  . pantoprazole  40 mg Oral Daily  . sodium chloride flush  3 mL Intravenous Q12H  . sodium zirconium cyclosilicate  10 g Oral TID   . sodium chloride Stopped (12/30/18 1703)  . amiodarone Stopped (12/31/18 1037)  . amiodarone    . DOPamine Stopped (12/30/18 6754)  . furosemide (LASIX) infusion 4 mg/hr (12/31/18 0800)  . magnesium sulfate    . norepinephrine (LEVOPHED) Adult infusion Stopped (12/28/18 0111)  . phenylephrine (NEO-SYNEPHRINE) Adult infusion Stopped (12/29/18 1503)   sodium chloride, diazepam, influenza vac split quadrivalent PF, metoprolol tartrate, morphine injection, ondansetron (ZOFRAN) IV, oxyCODONE, pneumococcal 23 valent vaccine, sodium chloride flush, traMADol    Exam: Gen alert, lying flat, no distress, nasal O2 No rash, cyanosis or gangrene Sclera anicteric, throat clear  +JVD Chest mild rales L base, R clear RRR no MRG, sternotomy scar and dressing Abd soft , nontender, slighlty distended, no mass or hsm GU normal male w/ foley draining normal appearing urine   MS no joint effusions or deformity Ext bilat pitting dependent hip  edema 2+ Neuro is alert, Ox 3 , nf    Home meds:  - amlodipine 10 qd/ metoprolol xl 25  - isosorbide mononitrate 60  - nicotine patch/ bactrim DS bid   UA 11/2 - >50 wbc/ 0-5 rbc, few bact, 1.010, large LE/ +nitrite, 100 prot  CXR 11/2 - normal  CXR  11/4- 11/7 > vasc congestion + IS edema mild pattern  I/o since admission = 8.3L in and 7.6 L out  Creat 11/2 = 2.90 > 2.95 on 11/5 > 4.18 yest >  5.27 today   Assessment/ Plan: 1. AKI on CKD IV - baseline eGFR 73m/min , baseline creat 2.5- 2.9. AKI here could be hypoperfusion from hypotension, decomp HF/ vol ^, ATN from vanc toxicity w/ vanc level 18 three days after surgery, or a combination. Making urine. DC"d metop/ isordil and surgery ^'d HR by pacer to get SBP up, goal BP > 100 for renal perfusion in CKD pt. Cont bolus lasix 80 IV tid , will dc lasix drip as bolus works better in advanced CKD.  Creat up a bit today but no indication for RRT. Cont to follow, hopefully w/ recover soon.  2. 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 Hypotension - a bit better, as above 4. Hyperkalemia - lokelma  ordered tid until K better, and renal diet 5. Vol excess - has some early CHF on all post op films, rales/ jvd and some edema. Not real symptomatic. As above      [employer] 12/31/2018, 11:32 AM  Iron/TIBC/Ferritin/ %Sat No results found for: IRON, TIBC, FERRITIN, IRONPCTSAT Recent Labs  Lab 12/27/18 1540  12/31/18 0911  NA  --    < > 142  K  --    < > 5.2*  CL  --    < > 106  CO2  --    < > 19*  GLUCOSE  --    < > 91  BUN  --    < > 87*  CREATININE  --    < > 5.89*  CALCIUM  --    < > 6.2*  ALBUMIN  --   --  2.3*  INR 1.3*  --   --    < > = values in this interval not displayed.   Recent Labs  Lab 12/31/18 0911  AST 30  ALT 24  ALKPHOS 68  BILITOT 0.6  PROT 5.6*   Recent Labs  Lab 12/31/18 0911  WBC 9.5  HGB 8.6*  HCT 28.1*  PLT 188

## 2018-12-31 NOTE — Progress Notes (Signed)
      [address]       [place] [address]             [phone]           [address]       [place] [address]             [phone]      Resting comfortably  Impulse control remains an issue  BP 107/85 (BP Location: Left Arm)   Pulse 88   Temp 98.1 F (36.7 C) (Oral)   Resp 13   Ht 5\' 3"  (1.6 m)   Wt 65.3 kg   SpO2 100%   BMI 25.50 kg/m    Intake/Output Summary (Last 24 hours) at 12/31/2018 1825 Last data filed at 12/31/2018 1800 Gross per 24 hour  Intake 1171.64 ml  Output 2050 ml  Net -878.36 ml   1000 ml of urine last 12 hours  Remo Lipps C. Roxan Hockey, MD [facility] [phone]

## 2018-12-31 NOTE — Plan of Care (Signed)
  Problem: Education: Goal: Will demonstrate proper wound care and an understanding of methods to prevent future damage Outcome: Progressing Goal: Knowledge of disease or condition will improve Outcome: Progressing Goal: Knowledge of the prescribed therapeutic regimen will improve Outcome: Progressing   Problem: Cardiac: Goal: Will achieve and/or maintain hemodynamic stability Outcome: Progressing   

## 2019-01-01 LAB — CBC WITH DIFFERENTIAL/PLATELET
Abs Immature Granulocytes: 0.03 10*3/uL (ref 0.00–0.07)
Basophils Absolute: 0 10*3/uL (ref 0.0–0.1)
Basophils Relative: 0 %
Eosinophils Absolute: 0.2 10*3/uL (ref 0.0–0.5)
Eosinophils Relative: 2 %
HCT: 26.4 % — ABNORMAL LOW (ref 39.0–52.0)
Hemoglobin: 8.3 g/dL — ABNORMAL LOW (ref 13.0–17.0)
Immature Granulocytes: 0 %
Lymphocytes Relative: 10 %
Lymphs Abs: 0.8 10*3/uL (ref 0.7–4.0)
MCH: 27.5 pg (ref 26.0–34.0)
MCHC: 31.4 g/dL (ref 30.0–36.0)
MCV: 87.4 fL (ref 80.0–100.0)
Monocytes Absolute: 0.8 10*3/uL (ref 0.1–1.0)
Monocytes Relative: 11 %
Neutro Abs: 5.9 10*3/uL (ref 1.7–7.7)
Neutrophils Relative %: 77 %
Platelets: 223 10*3/uL (ref 150–400)
RBC: 3.02 MIL/uL — ABNORMAL LOW (ref 4.22–5.81)
RDW: 15.6 % — ABNORMAL HIGH (ref 11.5–15.5)
WBC: 7.7 10*3/uL (ref 4.0–10.5)
nRBC: 0 % (ref 0.0–0.2)

## 2019-01-01 LAB — BASIC METABOLIC PANEL
Anion gap: 15 (ref 5–15)
BUN: 91 mg/dL — ABNORMAL HIGH (ref 6–20)
CO2: 22 mmol/L (ref 22–32)
Calcium: 6 mg/dL — CL (ref 8.9–10.3)
Chloride: 105 mmol/L (ref 98–111)
Creatinine, Ser: 6.08 mg/dL — ABNORMAL HIGH (ref 0.61–1.24)
GFR calc Af Amer: 11 mL/min — ABNORMAL LOW (ref >60)
GFR calc non Af Amer: 9 mL/min — ABNORMAL LOW (ref >60)
Glucose, Bld: 101 mg/dL — ABNORMAL HIGH (ref 70–99)
Potassium: 4.1 mmol/L (ref 3.5–5.1)
Sodium: 142 mmol/L (ref 135–145)

## 2019-01-01 LAB — GLUCOSE, CAPILLARY: Glucose-Capillary: >600 mg/dL (ref 70–99)

## 2019-01-01 NOTE — Progress Notes (Signed)
[facility] Progress Note  Subjective: No new complaints, in chair, excellent urine output, on 2 L nasal cannula, on 80 IV 3 times daily Lasix.  Serum creatinine fairly stable at 6.1 compared to 5.9 yesterday, K4.1, bicarbonate 22.  2.4 L net negative.  Vitals:   01/01/19 0700 01/01/19 0800 01/01/19 0809 01/01/19 0900  BP: 97/83 (!) 124/103  (!) 117/94  Pulse:  89    Resp: (!) 9 11  15   Temp:   98.1 F (36.7 C)   TempSrc:   Oral   SpO2:  99%    Weight:      Height:        Inpatient medications: . acetaminophen  1,000 mg Oral Q6H   Or  . acetaminophen (TYLENOL) oral liquid 160 mg/5 mL  1,000 mg Per Tube Q6H  . amiodarone  400 mg Oral BID  . aspirin EC  81 mg Oral Daily  . bisacodyl  10 mg Oral Daily   Or  . bisacodyl  10 mg Rectal Daily  . Chlorhexidine Gluconate Cloth  6 each Topical Daily  . clopidogrel  75 mg Oral Daily  . docusate sodium  200 mg Oral Daily  . furosemide  80 mg Intravenous TID  . mouth rinse  15 mL Mouth Rinse BID  . pantoprazole  40 mg Oral Daily  . sodium chloride flush  3 mL Intravenous Q12H   . sodium chloride Stopped (12/30/18 1703)  . amiodarone Stopped (12/31/18 1037)  . amiodarone    . DOPamine Stopped (12/30/18 7001)  . magnesium sulfate    . norepinephrine (LEVOPHED) Adult infusion Stopped (12/28/18 0111)  . phenylephrine (NEO-SYNEPHRINE) Adult infusion Stopped (12/29/18 1503)   sodium chloride, diazepam, influenza vac split quadrivalent PF, metoprolol tartrate, morphine injection, ondansetron (ZOFRAN) IV, oxyCODONE, pneumococcal 23 valent vaccine, sodium chloride flush, traMADol    Exam: Gen alert, sitting in chair, no distress, nasal O2 No rash, cyanosis or gangrene Sclera anicteric, throat clear  Chest mild rales L base, R clear RRR no MRG, sternotomy scar and dressing Abd soft , nontender, slighlty distended, no mass or hsm GU normal male w/ foley draining normal appearing urine   MS no joint effusions or  deformity Ext bilat pitting dependent hip edema 2+ Neuro is alert, Ox 3 , nf    Home meds:  - amlodipine 10 qd/ metoprolol xl 25  - isosorbide mononitrate 60  - nicotine patch/ bactrim DS bid   UA 11/2 - >50 wbc/ 0-5 rbc, few bact, 1.010, large LE/ +nitrite, 100 prot  CXR 11/2 - normal  CXR  11/4- 11/7 > vasc congestion + IS edema mild pattern  I/o since admission = 8.3L in and 7.6 L out  Creat 11/2 = 2.90 > 2.95 on 11/5 > 4.18 yest >  5.27 today   Assessment/ Plan: 1. AKI on CKD IV - baseline eGFR 76m/min , baseline creat 2.5- 2.9. AKI here could be hypoperfusion from hypotension, decomp HF/ vol ^, ATN from vanc toxicity w/ vanc level 18 three days after surgery, or a combination.  Responsive to Lasix.  Off metop/ isordil and surgery ^'d HR by pacer to get continue bolus Lasix.   creat up a bit today but no indication for RRT. Cont to follow, hopefully w/ recover soon.  2. 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 Hypotension - bettter, as above 4. Hyperkalemia - lStable, off Lokelma 5. Vol excess -improved in past 24 hours. As above   [employer]  01/01/2019, 9:51 AM  Iron/TIBC/Ferritin/ %Sat No results found for: IRON, TIBC, FERRITIN, IRONPCTSAT Recent Labs  Lab 12/27/18 1540  12/31/18 0911 01/01/19 0252  NA  --    < > 142 142  K  --    < > 5.2* 4.1  CL  --    < > 106 105  CO2  --    < > 19* 22  GLUCOSE  --    < > 91 101*  BUN  --    < > 87* 91*  CREATININE  --    < > 5.89* 6.08*  CALCIUM  --    < > 6.2* 6.0*  ALBUMIN  --   --  2.3*  --   INR 1.3*  --   --   --    < > = values in this interval not displayed.   Recent Labs  Lab 12/31/18 0911  AST 30  ALT 24  ALKPHOS 68  BILITOT 0.6  PROT 5.6*   Recent Labs  Lab 01/01/19 0252  WBC 7.7  HGB 8.3*  HCT 26.4*  PLT 223

## 2019-01-01 NOTE — Evaluation (Signed)
Physical Therapy Evaluation Patient Details Name: Anthony Hess MRN: [id number] DOB: [birth year] Today's Date: 01/01/2019   History of Present Illness  Patient is a 63 y/o male who presents s/p CABGx4 11/4. PMH includes MI, HTN, CAD, bladder cancer.  Clinical Impression  Patient presents with generalized weakness, fatigue, decreased activity tolerance, impaired cognition and impaired mobility s/p above. 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 Today, pt requires Min guard-Min A for bed mobility, transfers and gait training with use of RW for support. Pt demonstrates cognitive deficits relating to orientation, attention, problem solving, safety, awareness. Education re: sternal precautions and "move in the tube" however pt not adhering to these during session despite Max cues. Will follow acutely to maximize independence and mobility prior to return home.     Follow Up Recommendations Home health PT;Supervision for mobility/OOB;Supervision/Assistance - 24 hour    Equipment Recommendations  Rolling walker with 5" wheels    Recommendations for Other Services       Precautions / Restrictions Precautions Precautions: Fall Precaution Comments: pacer Restrictions Weight Bearing Restrictions: Yes Other Position/Activity Restrictions: sternal      Mobility  Bed Mobility Overal bed mobility: Needs Assistance Bed Mobility: Supine to Sit     Supine to sit: Min guard;HOB elevated     General bed mobility comments: Impulsively getting to EOB despite cues to adhere to sternal precautions. "now what"  Transfers Overall transfer level: Needs assistance Equipment used: Rolling walker (2 wheeled) Transfers: Sit to/from Stand Sit to Stand: Min assist         General transfer comment: Min A to steady in standing; stood from EOB x2. Cues for hand palcement however pt pulling up on RW, impulsive.  Ambulation/Gait Ambulation/Gait assistance: Min  guard Gait Distance (Feet): 5 Feet Assistive device: Rolling walker (2 wheeled) Gait Pattern/deviations: Step-through pattern;Decreased stride length Gait velocity: decreased   General Gait Details: Slow, unsteady gait taking a few steps to get to chair; declined further walking distance, "i am just tired" Sp02 difficult reading, on 2L/min 02. HR stable paced.  Stairs            Wheelchair Mobility    Modified Rankin (Stroke Patients Only)       Balance Overall balance assessment: Needs assistance Sitting-balance support: Feet supported;No upper extremity supported Sitting balance-Leahy Scale: Fair     Standing balance support: During functional activity Standing balance-Leahy Scale: Poor Standing balance comment: Requires UE support in standing.                             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     Journalist, newspaper        Extremity/Trunk Assessment   Upper Extremity Assessment Upper Extremity Assessment: Defer to OT evaluation    Lower Extremity Assessment Lower Extremity Assessment: Generalized weakness       Communication   Communication: No difficulties  Cognition Arousal/Alertness: Awake/alert Behavior During Therapy: Impulsive Overall Cognitive Status: Impaired/Different from baseline Area of  Impairment: Orientation;Memory;Safety/judgement;Awareness;Problem solving;Attention;Following commands                 Orientation Level: Disoriented to;Time;Situation Current Attention  Level: Focused Memory: Decreased short-term memory Following Commands: Follows one step commands inconsistently Safety/Judgement: Decreased awareness of safety;Decreased awareness of deficits Awareness: Intellectual Problem Solving: Slow processing;Requires verbal cues General Comments: "January" "December" when asked the year "11" and then got to "20"; "why didn't they just let me die?" Adamant about wanting to use the bathroom despite being told he had a catheter in, "dont look at me." "I did not have surgery, I had stents"      General Comments General comments (skin integrity, edema, etc.): VSS throughout. NOt able to get a good SP02 reading, no SOB noted.    Exercises     Assessment/Plan    PT Assessment Patient needs continued PT services  PT Problem List Decreased strength;Decreased mobility;Decreased safety awareness;Decreased knowledge of precautions;Decreased activity tolerance;Decreased cognition;Cardiopulmonary status limiting activity;Decreased balance;Decreased knowledge of use of DME       PT Treatment Interventions Therapeutic activities;Gait training;Therapeutic exercise;Patient/family education;Balance training;Stair training;Functional mobility training;DME instruction    PT Goals (Current goals can be found in the Care Plan section)  Acute Rehab PT Goals Patient Stated Goal: to just rest PT Goal Formulation: With patient Time For Goal Achievement: 01/15/19 Potential to Achieve Goals: Good    Frequency Min 3X/week   Barriers to discharge Inaccessible home environment stairs    Co-evaluation               AM-PAC PT "6 Clicks" Mobility  Outcome Measure Help needed turning from your back to your side while in a flat bed without using bedrails?: A Little Help needed moving from lying on your back to sitting on the side of a flat bed without using bedrails?: A Little Help needed moving to and from a bed to a chair (including a wheelchair)?: A  Little Help needed standing up from a chair using your arms (e.g., wheelchair or bedside chair)?: A Little Help needed to walk in hospital room?: A Little Help needed climbing 3-5 steps with a railing? : A Lot 6 Click Score: 17    End of Session Equipment Utilized During Treatment: Oxygen;Gait belt Activity Tolerance: Patient limited by fatigue Patient left: in chair;with call bell/phone within reach Nurse Communication: Mobility status;Other (comment)(RN aware no chair alarm, watching from window) PT Visit Diagnosis: Unsteadiness on feet (R26.81);Muscle weakness (generalized) (M62.81);Difficulty in walking, not elsewhere classified (R26.2)    Time: 8786-7672 PT Time Calculation (min) (ACUTE ONLY): 20 min   Charges:   PT Evaluation $PT Eval Moderate Complexity: 1 Mod          Marisa Severin, PT, DPT Acute Rehabilitation Services Pager [phone] Office [place] 01/01/2019, 9:18 AM

## 2019-01-01 NOTE — Progress Notes (Signed)
Patient ID: Anthony Hess, male   DOB: 1955/04/23, 63 y.o.   MRN: [id number] TCTS Evening Rounds:  Hemodynamically stable in sinus rhythm 80's. Pacer turned off but left attached.  Good urine output.  Reportedly more alert and cooperative today.  Ambulated.

## 2019-01-02 ENCOUNTER — Inpatient Hospital Stay (HOSPITAL_COMMUNITY): Payer: Medicaid Other

## 2019-01-02 LAB — CBC WITH DIFFERENTIAL/PLATELET
Abs Immature Granulocytes: 0.03 10*3/uL (ref 0.00–0.07)
Basophils Absolute: 0 10*3/uL (ref 0.0–0.1)
Basophils Relative: 0 %
Eosinophils Absolute: 0.2 10*3/uL (ref 0.0–0.5)
Eosinophils Relative: 3 %
HCT: 25.7 % — ABNORMAL LOW (ref 39.0–52.0)
Hemoglobin: 8.1 g/dL — ABNORMAL LOW (ref 13.0–17.0)
Immature Granulocytes: 0 %
Lymphocytes Relative: 10 %
Lymphs Abs: 0.7 10*3/uL (ref 0.7–4.0)
MCH: 27.5 pg (ref 26.0–34.0)
MCHC: 31.5 g/dL (ref 30.0–36.0)
MCV: 87.1 fL (ref 80.0–100.0)
Monocytes Absolute: 1 10*3/uL (ref 0.1–1.0)
Monocytes Relative: 15 %
Neutro Abs: 4.9 10*3/uL (ref 1.7–7.7)
Neutrophils Relative %: 72 %
Platelets: 231 10*3/uL (ref 150–400)
RBC: 2.95 MIL/uL — ABNORMAL LOW (ref 4.22–5.81)
RDW: 15.1 % (ref 11.5–15.5)
WBC: 6.8 10*3/uL (ref 4.0–10.5)
nRBC: 0 % (ref 0.0–0.2)

## 2019-01-02 LAB — BASIC METABOLIC PANEL
Anion gap: 14 (ref 5–15)
BUN: 96 mg/dL — ABNORMAL HIGH (ref 6–20)
CO2: 23 mmol/L (ref 22–32)
Calcium: 6.2 mg/dL — CL (ref 8.9–10.3)
Chloride: 105 mmol/L (ref 98–111)
Creatinine, Ser: 5.53 mg/dL — ABNORMAL HIGH (ref 0.61–1.24)
GFR calc Af Amer: 12 mL/min — ABNORMAL LOW (ref >60)
GFR calc non Af Amer: 10 mL/min — ABNORMAL LOW (ref >60)
Glucose, Bld: 116 mg/dL — ABNORMAL HIGH (ref 70–99)
Potassium: 3.2 mmol/L — ABNORMAL LOW (ref 3.5–5.1)
Sodium: 142 mmol/L (ref 135–145)

## 2019-01-02 MED ORDER — ADULT MULTIVITAMIN LIQUID CH
15.0000 mL | Freq: Every day | ORAL | Status: DC
  Administered 2019-01-02 – 2019-01-03 (×2): 15 mL via ORAL
  Filled 2019-01-02 (×3): qty 15

## 2019-01-02 MED ORDER — FE FUMARATE-B12-VIT C-FA-IFC PO CAPS
1.0000 | ORAL_CAPSULE | Freq: Three times a day (TID) | ORAL | Status: DC
  Administered 2019-01-02 – 2019-01-04 (×9): 1 via ORAL
  Filled 2019-01-02 (×12): qty 1

## 2019-01-02 MED ORDER — FUROSEMIDE 10 MG/ML IJ SOLN
80.0000 mg | Freq: Two times a day (BID) | INTRAMUSCULAR | Status: DC
  Administered 2019-01-02 – 2019-01-03 (×2): 80 mg via INTRAVENOUS
  Filled 2019-01-02 (×2): qty 8

## 2019-01-02 MED ORDER — POTASSIUM CHLORIDE CRYS ER 20 MEQ PO TBCR
20.0000 meq | EXTENDED_RELEASE_TABLET | ORAL | Status: AC
  Administered 2019-01-02 (×3): 20 meq via ORAL
  Filled 2019-01-02 (×3): qty 1

## 2019-01-02 MED ORDER — PRO-STAT SUGAR FREE PO LIQD
30.0000 mL | Freq: Two times a day (BID) | ORAL | Status: DC
  Administered 2019-01-02 – 2019-01-04 (×5): 30 mL via ORAL
  Filled 2019-01-02 (×6): qty 30

## 2019-01-02 NOTE — Progress Notes (Signed)
6 Days Post-Op Procedure(s) (LRB): CORONARY ARTERY BYPASS GRAFTING (CABG) X 4 ON PUMP USING RIGHT & LEFT INTERNAL MAMMARY ARTERY LEFT RADIAL ARTERY ENDOSCOPICALLY HARVESTED (N/A) ENDOSCOPIC RADIAL ARTERY HARVEST (Left) TRANSESOPHAGEAL ECHOCARDIOGRAM (TEE) (N/A) INDOCYANINE GREEN FLUORESCENCE IMAGING (ICG) (N/A) Subjective: No complaints  Objective: Vital signs in last 24 hours: Temp:  [97.9 F (36.6 C)-98.7 F (37.1 C)] 98.7 F (37.1 C) (11/10 0817) Pulse Rate:  [59-90] 75 (11/10 0800) Cardiac Rhythm: Normal sinus rhythm (11/10 0800) Resp:  [9-24] 10 (11/10 0800) BP: (81-124)/(55-94) 103/81 (11/10 0800) SpO2:  [90 %-99 %] 95 % (11/10 0800) Weight:  [57.6 kg] 57.6 kg (11/10 0659)  Hemodynamic parameters for last 24 hours:    Intake/Output from previous day: 11/09 0701 - 11/10 0700 In: 480 [P.O.:480] Out: 2735 [Urine:2735] Intake/Output this shift: Total I/O In: 120 [P.O.:120] Out: 125 [Urine:125]  General appearance: alert and no distress Neurologic: intact Heart: regular rate and rhythm, S1, S2 normal, no murmur, click, rub or gallop Lungs: clear to auscultation bilaterally Abdomen: soft, non-tender; bowel sounds normal; no masses,  no organomegaly Extremities: edema mild Wound: dressed, dry  Lab Results: Recent Labs    01/01/19 0252 01/02/19 0237  WBC 7.7 6.8  HGB 8.3* 8.1*  HCT 26.4* 25.7*  PLT 223 231   BMET:  Recent Labs    01/01/19 0252 01/02/19 0237  NA 142 142  K 4.1 3.2*  CL 105 105  CO2 22 23  GLUCOSE 101* 116*  BUN 91* 96*  CREATININE 6.08* 5.53*  CALCIUM 6.0* 6.2*    PT/INR: No results for input(s): LABPROT, INR in the last 72 hours. ABG    Component Value Date/Time   PHART 7.253 (L) 12/28/2018 1430   HCO3 20.3 12/28/2018 1430   TCO2 22 12/28/2018 1430   ACIDBASEDEF 7.0 (H) 12/28/2018 1430   O2SAT 88.0 12/28/2018 1430   CBG (last 3)  Recent Labs    12/30/18 0920 12/31/18 1132  GLUCAP 130* 126*    Assessment/Plan: S/P  Procedure(s) (LRB): CORONARY ARTERY BYPASS GRAFTING (CABG) X 4 ON PUMP USING RIGHT & LEFT INTERNAL MAMMARY ARTERY LEFT RADIAL ARTERY ENDOSCOPICALLY HARVESTED (N/A) ENDOSCOPIC RADIAL ARTERY HARVEST (Left) TRANSESOPHAGEAL ECHOCARDIOGRAM (TEE) (N/A) INDOCYANINE GREEN FLUORESCENCE IMAGING (ICG) (N/A) Mobilize Diuresis continue to allow renal recovery; may be able to leave ICU   LOS: 6 days    [name] 01/02/2019

## 2019-01-02 NOTE — Progress Notes (Signed)
CARDIAC REHAB PHASE I   PRE:  Rate/Rhythm: 82 SR  BP:  Sitting: 96/67      SaO2: 96 RA  Pt agreeable to walk. Pt needing frequent reminders of sternal precautions. Pt assisted around the end of the bed and seemed less alert. When asked if he was tired, pt agreed and stated he needed to sit. Pt sat on edge of bed, BP retaken, 75/58. Pt then states he needed to lie down, BP taken again 98/80. Pt adjusted in bed, bed alarm on. Encouraged pt to get up to chair as able later today with help. Will continue to follow.  [id number] Rufina Falco, RN BSN 01/02/2019 2:53 PM

## 2019-01-02 NOTE — TOC Progression Note (Signed)
Transition of Care [facility]) - Progression Note    Patient Details  Name: Anthony Hess MRN: [id number] Date of Birth: May 29, 1955  Transition of Care [facility]) CM/SW Contact  Zenon Mayo, RN Phone Number: 01/02/2019, 7:11 PM  Clinical Narrative:    Transfer from [place], patient from home with wife,  s/p CABG, has tele sitter, oreinted to self and place. Per pt eval rec HHPT and rolling walker.          Expected Discharge Plan and Services                                                 Social Determinants of Health (SDOH) Interventions    Readmission Risk Interventions No flowsheet data found.

## 2019-01-02 NOTE — Progress Notes (Signed)
[facility] Progress Note  Subjective:  Excellent urine output yesterday, 3.5 L, 2.4 L net negative and 2.7 kg down  Creatinine improved from 6.1-5.5, K3.2 this morning.  Currently on IV Lasix 80 mg 3 times daily  More awake, alert.  Vitals:   01/02/19 0659 01/02/19 0700 01/02/19 0800 01/02/19 0817  BP:  106/90 103/81   Pulse:  85 75   Resp:  15 10   Temp:    98.7 F (37.1 C)  TempSrc:    Oral  SpO2:  94% 95%   Weight: 57.6 kg     Height:        Inpatient medications: . amiodarone  400 mg Oral BID  . aspirin EC  81 mg Oral Daily  . bisacodyl  10 mg Oral Daily   Or  . bisacodyl  10 mg Rectal Daily  . Chlorhexidine Gluconate Cloth  6 each Topical Daily  . clopidogrel  75 mg Oral Daily  . docusate sodium  200 mg Oral Daily  . feeding supplement (PRO-STAT SUGAR FREE 64)  30 mL Oral BID  . ferrous [id number] C-folic acid  1 capsule Oral TID PC  . furosemide  80 mg Intravenous TID  . mouth rinse  15 mL Mouth Rinse BID  . multivitamin  15 mL Oral Daily  . pantoprazole  40 mg Oral Daily  . potassium chloride  20 mEq Oral Q4H  . sodium chloride flush  3 mL Intravenous Q12H   . sodium chloride Stopped (12/30/18 1703)  . magnesium sulfate     sodium chloride, influenza vac split quadrivalent PF, metoprolol tartrate, ondansetron (ZOFRAN) IV, oxyCODONE, pneumococcal 23 valent vaccine, sodium chloride flush, traMADol    Exam: Gen alert,no distress, nasal O2 Diminished in the bases otherwise clear, normal work of breathing RRR no MRG, sternotomy scar and dressing Abd soft , nontender, slighlty distended, no mass or hsm GU normal male w/ foley draining normal appearing urine   MS no joint effusions or deformity Ext  trace edema FF Neuro is alert, Ox 3 , nf    Home meds:  - amlodipine 10 qd/ metoprolol xl 25  - isosorbide mononitrate 60  - nicotine patch/ bactrim DS bid   UA 11/2 - >50 wbc/ 0-5 rbc, few bact, 1.010, large LE/ +nitrite, 100  prot  CXR 11/2 - normal  CXR  11/4- 11/7 > vasc congestion + IS edema mild pattern  I/o since admission = 8.3L in and 7.6 L out  Creat 11/2 = 2.90 > 2.95 on 11/5 > 4.18 yest >  5.27 today   Assessment/ Plan: 1. AKI on CKD IV - baseline eGFR 80m/min , baseline creat 2.5- 2.9. AKI here could be hypoperfusion from hypotension, decomp HF/ vol ^, ATN from vanc toxicity w/ vanc level 18 three days after surgery, or a combination.  Increasingly responsive to Lasix with improving creatinine, suggestive of recovery.  Reduce Lasix to twice daily today, will make sure he receives potassium chloride 2. 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 Hypotension - bettter, as above 4. Hyperkalemia -resolved with diuresis and improved GFR 5. Vol excess -significantly improved in past 24 hours. As above   RRexene Agent 01/02/2019, 9:25 AM  Iron/TIBC/Ferritin/ %Sat No results found for: IRON, TIBC, FERRITIN, IRONPCTSAT Recent Labs  Lab 12/27/18 1540  12/31/18 0911  01/02/19 0237  NA  --    < > 142   < > 142  K  --    < >  5.2*   < > 3.2*  CL  --    < > 106   < > 105  CO2  --    < > 19*   < > 23  GLUCOSE  --    < > 91   < > 116*  BUN  --    < > 87*   < > 96*  CREATININE  --    < > 5.89*   < > 5.53*  CALCIUM  --    < > 6.2*   < > 6.2*  ALBUMIN  --   --  2.3*  --   --   INR 1.3*  --   --   --   --    < > = values in this interval not displayed.   Recent Labs  Lab 12/31/18 0911  AST 30  ALT 24  ALKPHOS 68  BILITOT 0.6  PROT 5.6*   Recent Labs  Lab 01/02/19 0237  WBC 6.8  HGB 8.1*  HCT 25.7*  PLT 231

## 2019-01-02 NOTE — Progress Notes (Signed)
[facility] Progress Note  Subjective: No new complaints, in chair, excellent urine output, on 2 L nasal cannula, on 80 IV 3 times daily Lasix.  Serum creatinine fairly stable at 6.1 compared to 5.9 yesterday, K4.1, bicarbonate 22.  2.4 L net negative.  Vitals:   01/02/19 0659 01/02/19 0700 01/02/19 0800 01/02/19 0817  BP:  106/90 103/81   Pulse:  85 75   Resp:  15 10   Temp:    98.7 F (37.1 C)  TempSrc:    Oral  SpO2:  94% 95%   Weight: 57.6 kg     Height:        Inpatient medications: . amiodarone  400 mg Oral BID  . aspirin EC  81 mg Oral Daily  . bisacodyl  10 mg Oral Daily   Or  . bisacodyl  10 mg Rectal Daily  . Chlorhexidine Gluconate Cloth  6 each Topical Daily  . clopidogrel  75 mg Oral Daily  . docusate sodium  200 mg Oral Daily  . feeding supplement (PRO-STAT SUGAR FREE 64)  30 mL Oral BID  . ferrous [id number] C-folic acid  1 capsule Oral TID PC  . furosemide  80 mg Intravenous TID  . mouth rinse  15 mL Mouth Rinse BID  . multivitamin  15 mL Oral Daily  . pantoprazole  40 mg Oral Daily  . potassium chloride  20 mEq Oral Q4H  . sodium chloride flush  3 mL Intravenous Q12H   . sodium chloride Stopped (12/30/18 1703)  . magnesium sulfate     sodium chloride, influenza vac split quadrivalent PF, metoprolol tartrate, ondansetron (ZOFRAN) IV, oxyCODONE, pneumococcal 23 valent vaccine, sodium chloride flush, traMADol    Exam: Gen alert, sitting in chair, no distress, nasal O2 No rash, cyanosis or gangrene Sclera anicteric, throat clear  Chest mild rales L base, R clear RRR no MRG, sternotomy scar and dressing Abd soft , nontender, slighlty distended, no mass or hsm GU normal male w/ foley draining normal appearing urine   MS no joint effusions or deformity Ext bilat pitting dependent hip edema 2+ Neuro is alert, Ox 3 , nf    Home meds:  - amlodipine 10 qd/ metoprolol xl 25  - isosorbide mononitrate 60  - nicotine patch/  bactrim DS bid   UA 11/2 - >50 wbc/ 0-5 rbc, few bact, 1.010, large LE/ +nitrite, 100 prot  CXR 11/2 - normal  CXR  11/4- 11/7 > vasc congestion + IS edema mild pattern  I/o since admission = 8.3L in and 7.6 L out  Creat 11/2 = 2.90 > 2.95 on 11/5 > 4.18 yest >  5.27 today   Assessment/ Plan: 1. AKI on CKD IV - baseline eGFR 58m/min , baseline creat 2.5- 2.9. AKI here could be hypoperfusion from hypotension, decomp HF/ vol ^, ATN from vanc toxicity w/ vanc level 18 three days after surgery, or a combination.  Responsive to Lasix.  Off metop/ isordil and surgery ^'d HR by pacer to get continue bolus Lasix.   creat up a bit today but no indication for RRT. Cont to follow, hopefully w/ recover soon.  2. 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 Hypotension - bettter, as above 4. Hyperkalemia - lStable, off Lokelma 5. Vol excess -improved in past 24 hours. As above   RRexene Agent 01/02/2019, 8:33 AM  Iron/TIBC/Ferritin/ %Sat No results found for: IRON, TIBC, FERRITIN, IRONPCTSAT Recent Labs  Lab 12/27/18 1540  12/31/18 0911  01/02/19 0237  NA  --    < > 142   < > 142  K  --    < > 5.2*   < > 3.2*  CL  --    < > 106   < > 105  CO2  --    < > 19*   < > 23  GLUCOSE  --    < > 91   < > 116*  BUN  --    < > 87*   < > 96*  CREATININE  --    < > 5.89*   < > 5.53*  CALCIUM  --    < > 6.2*   < > 6.2*  ALBUMIN  --   --  2.3*  --   --   INR 1.3*  --   --   --   --    < > = values in this interval not displayed.   Recent Labs  Lab 12/31/18 0911  AST 30  ALT 24  ALKPHOS 68  BILITOT 0.6  PROT 5.6*   Recent Labs  Lab 01/02/19 0237  WBC 6.8  HGB 8.1*  HCT 25.7*  PLT 231

## 2019-01-02 NOTE — Plan of Care (Signed)
°  Problem: Education: °Goal: Will demonstrate proper wound care and an understanding of methods to prevent future damage °Outcome: Progressing °Goal: Knowledge of disease or condition will improve °Outcome: Progressing °Goal: Knowledge of the prescribed therapeutic regimen will improve °Outcome: Progressing °Goal: Individualized Educational Video(s) °Outcome: Progressing °  °Problem: Activity: °Goal: Risk for activity intolerance will decrease °Outcome: Progressing °  °Problem: Cardiac: °Goal: Will achieve and/or maintain hemodynamic stability °Outcome: Progressing °  °Problem: Clinical Measurements: °Goal: Postoperative complications will be avoided or minimized °Outcome: Progressing °  °Problem: Respiratory: °Goal: Respiratory status will improve °Outcome: Progressing °  °Problem: Skin Integrity: °Goal: Wound healing without signs and symptoms of infection °Outcome: Progressing °Goal: Risk for impaired skin integrity will decrease °Outcome: Progressing °  °Problem: Urinary Elimination: °Goal: Ability to achieve and maintain adequate renal perfusion and functioning will improve °Outcome: Progressing °  °Problem: Education: °Goal: Knowledge of General Education information will improve °Description: Including pain rating scale, medication(s)/side effects and non-pharmacologic comfort measures °Outcome: Progressing °  °Problem: Health Behavior/Discharge Planning: °Goal: Ability to manage health-related needs will improve °Outcome: Progressing °  °Problem: Clinical Measurements: °Goal: Ability to maintain clinical measurements within normal limits will improve °Outcome: Progressing °Goal: Will remain free from infection °Outcome: Progressing °Goal: Diagnostic test results will improve °Outcome: Progressing °Goal: Respiratory complications will improve °Outcome: Progressing °Goal: Cardiovascular complication will be avoided °Outcome: Progressing °  °Problem: Activity: °Goal: Risk for activity intolerance will  decrease °Outcome: Progressing °  °Problem: Nutrition: °Goal: Adequate nutrition will be maintained °Outcome: Progressing °  °Problem: Coping: °Goal: Level of anxiety will decrease °Outcome: Progressing °  °Problem: Elimination: °Goal: Will not experience complications related to bowel motility °Outcome: Progressing °Goal: Will not experience complications related to urinary retention °Outcome: Progressing °  °Problem: Pain Managment: °Goal: General experience of comfort will improve °Outcome: Progressing °  °Problem: Safety: °Goal: Ability to remain free from injury will improve °Outcome: Progressing °  °Problem: Skin Integrity: °Goal: Risk for impaired skin integrity will decrease °Outcome: Progressing °  °

## 2019-01-03 ENCOUNTER — Inpatient Hospital Stay (HOSPITAL_COMMUNITY): Payer: Medicaid Other

## 2019-01-03 LAB — COMPREHENSIVE METABOLIC PANEL
ALT: 25 U/L (ref 0–44)
AST: 27 U/L (ref 15–41)
Albumin: 2.5 g/dL — ABNORMAL LOW (ref 3.5–5.0)
Alkaline Phosphatase: 65 U/L (ref 38–126)
Anion gap: 16 — ABNORMAL HIGH (ref 5–15)
BUN: 96 mg/dL — ABNORMAL HIGH (ref 6–20)
CO2: 26 mmol/L (ref 22–32)
Calcium: 6.7 mg/dL — ABNORMAL LOW (ref 8.9–10.3)
Chloride: 100 mmol/L (ref 98–111)
Creatinine, Ser: 5.15 mg/dL — ABNORMAL HIGH (ref 0.61–1.24)
GFR calc Af Amer: 13 mL/min — ABNORMAL LOW (ref >60)
GFR calc non Af Amer: 11 mL/min — ABNORMAL LOW (ref >60)
Glucose, Bld: 138 mg/dL — ABNORMAL HIGH (ref 70–99)
Potassium: 3.4 mmol/L — ABNORMAL LOW (ref 3.5–5.1)
Sodium: 142 mmol/L (ref 135–145)
Total Bilirubin: 0.5 mg/dL (ref 0.3–1.2)
Total Protein: 6 g/dL — ABNORMAL LOW (ref 6.5–8.1)

## 2019-01-03 LAB — CBC WITH DIFFERENTIAL/PLATELET
Abs Immature Granulocytes: 0.03 10*3/uL (ref 0.00–0.07)
Basophils Absolute: 0 10*3/uL (ref 0.0–0.1)
Basophils Relative: 0 %
Eosinophils Absolute: 0.4 10*3/uL (ref 0.0–0.5)
Eosinophils Relative: 5 %
HCT: 27.9 % — ABNORMAL LOW (ref 39.0–52.0)
Hemoglobin: 8.8 g/dL — ABNORMAL LOW (ref 13.0–17.0)
Immature Granulocytes: 0 %
Lymphocytes Relative: 9 %
Lymphs Abs: 0.8 10*3/uL (ref 0.7–4.0)
MCH: 27.2 pg (ref 26.0–34.0)
MCHC: 31.5 g/dL (ref 30.0–36.0)
MCV: 86.1 fL (ref 80.0–100.0)
Monocytes Absolute: 1.3 10*3/uL — ABNORMAL HIGH (ref 0.1–1.0)
Monocytes Relative: 16 %
Neutro Abs: 5.7 10*3/uL (ref 1.7–7.7)
Neutrophils Relative %: 70 %
Platelets: 258 10*3/uL (ref 150–400)
RBC: 3.24 MIL/uL — ABNORMAL LOW (ref 4.22–5.81)
RDW: 14.6 % (ref 11.5–15.5)
WBC: 8.3 10*3/uL (ref 4.0–10.5)
nRBC: 0 % (ref 0.0–0.2)

## 2019-01-03 MED ORDER — FUROSEMIDE 80 MG PO TABS
80.0000 mg | ORAL_TABLET | Freq: Two times a day (BID) | ORAL | Status: DC
  Administered 2019-01-03 – 2019-01-05 (×4): 80 mg via ORAL
  Filled 2019-01-03 (×3): qty 1

## 2019-01-03 MED ORDER — POTASSIUM CHLORIDE CRYS ER 20 MEQ PO TBCR
40.0000 meq | EXTENDED_RELEASE_TABLET | Freq: Two times a day (BID) | ORAL | Status: AC
  Administered 2019-01-03 (×2): 40 meq via ORAL
  Filled 2019-01-03 (×2): qty 2

## 2019-01-03 MED ORDER — MAGIC MOUTHWASH
5.0000 mL | Freq: Three times a day (TID) | ORAL | Status: DC
  Administered 2019-01-03 – 2019-01-04 (×6): 5 mL via ORAL
  Filled 2019-01-03 (×9): qty 5

## 2019-01-03 MED ORDER — ATORVASTATIN CALCIUM 80 MG PO TABS
80.0000 mg | ORAL_TABLET | Freq: Every day | ORAL | Status: DC
  Administered 2019-01-03 – 2019-01-04 (×2): 80 mg via ORAL
  Filled 2019-01-03 (×2): qty 1

## 2019-01-03 NOTE — Plan of Care (Signed)
°  Problem: Education: °Goal: Will demonstrate proper wound care and an understanding of methods to prevent future damage °Outcome: Progressing °Goal: Knowledge of disease or condition will improve °Outcome: Progressing °Goal: Knowledge of the prescribed therapeutic regimen will improve °Outcome: Progressing °Goal: Individualized Educational Video(s) °Outcome: Progressing °  °Problem: Activity: °Goal: Risk for activity intolerance will decrease °Outcome: Progressing °  °Problem: Cardiac: °Goal: Will achieve and/or maintain hemodynamic stability °Outcome: Progressing °  °Problem: Clinical Measurements: °Goal: Postoperative complications will be avoided or minimized °Outcome: Progressing °  °Problem: Respiratory: °Goal: Respiratory status will improve °Outcome: Progressing °  °Problem: Skin Integrity: °Goal: Wound healing without signs and symptoms of infection °Outcome: Progressing °Goal: Risk for impaired skin integrity will decrease °Outcome: Progressing °  °Problem: Urinary Elimination: °Goal: Ability to achieve and maintain adequate renal perfusion and functioning will improve °Outcome: Progressing °  °Problem: Education: °Goal: Knowledge of General Education information will improve °Description: Including pain rating scale, medication(s)/side effects and non-pharmacologic comfort measures °Outcome: Progressing °  °Problem: Health Behavior/Discharge Planning: °Goal: Ability to manage health-related needs will improve °Outcome: Progressing °  °Problem: Clinical Measurements: °Goal: Ability to maintain clinical measurements within normal limits will improve °Outcome: Progressing °Goal: Will remain free from infection °Outcome: Progressing °Goal: Diagnostic test results will improve °Outcome: Progressing °Goal: Respiratory complications will improve °Outcome: Progressing °Goal: Cardiovascular complication will be avoided °Outcome: Progressing °  °Problem: Activity: °Goal: Risk for activity intolerance will  decrease °Outcome: Progressing °  °Problem: Nutrition: °Goal: Adequate nutrition will be maintained °Outcome: Progressing °  °Problem: Coping: °Goal: Level of anxiety will decrease °Outcome: Progressing °  °Problem: Elimination: °Goal: Will not experience complications related to bowel motility °Outcome: Progressing °Goal: Will not experience complications related to urinary retention °Outcome: Progressing °  °Problem: Pain Managment: °Goal: General experience of comfort will improve °Outcome: Progressing °  °Problem: Safety: °Goal: Ability to remain free from injury will improve °Outcome: Progressing °  °Problem: Skin Integrity: °Goal: Risk for impaired skin integrity will decrease °Outcome: Progressing °  °

## 2019-01-03 NOTE — Progress Notes (Addendum)
      [address]       Anthony Hess,[place] [address]             [phone]        7 Days Post-Op Procedure(s) (LRB): CORONARY ARTERY BYPASS GRAFTING (CABG) X 4 ON PUMP USING RIGHT & LEFT INTERNAL MAMMARY ARTERY LEFT RADIAL ARTERY ENDOSCOPICALLY HARVESTED (N/A) ENDOSCOPIC RADIAL ARTERY HARVEST (Left) TRANSESOPHAGEAL ECHOCARDIOGRAM (TEE) (N/A) INDOCYANINE GREEN FLUORESCENCE IMAGING (ICG) (N/A)  Subjective: Patient awake and alert. He states he is not eating much because he does not have an appetite.  Objective: Vital signs in last 24 hours: Temp:  [97.6 F (36.4 C)-98.1 F (36.7 C)] 98.1 F (36.7 C) (11/11 0715) Pulse Rate:  [76-80] 78 (11/11 0715) Cardiac Rhythm: Normal sinus rhythm (11/11 0731) Resp:  [12-18] 15 (11/11 0715) BP: (97-115)/(66-81) 99/66 (11/11 0715) SpO2:  [92 %-95 %] 94 % (11/11 0715) Weight:  [56 kg] 56 kg (11/11 0610)  Pre op weight 64.2 kg Current Weight  01/03/19 56 kg       Intake/Output from previous day: 11/10 0701 - 11/11 0700 In: 1249 [P.O.:1246; I.V.:3] Out: 1450 [Urine:1450]   Physical Exam:  Cardiovascular: RRR Pulmonary: Slightly diminished bibasilar breath sounds Abdomen: Soft, non tender, bowel sounds present. Extremities: No lower extremity edema. Wounds: Clean and dry.  No erythema or signs of infection.  Lab Results: CBC: Recent Labs    01/01/19 0252 01/02/19 0237  WBC 7.7 6.8  HGB 8.3* 8.1*  HCT 26.4* 25.7*  PLT 223 231   BMET:  Recent Labs    01/01/19 0252 01/02/19 0237  NA 142 142  K 4.1 3.2*  CL 105 105  CO2 22 23  GLUCOSE 101* 116*  BUN 91* 96*  CREATININE 6.08* 5.53*  CALCIUM 6.0* 6.2*    PT/INR:  Lab Results  Component Value Date   INR 1.3 (H) 12/27/2018   INR 1.0 12/25/2018   ABG:  INR: Will add last result for INR, ABG once components are confirmed Will add last 4 CBG results once components are confirmed  Assessment/Plan:  1. CV - SR in the 70's.  On Amiodarone 400 mg bid,  and Plavix 75 mg daily. Not on Imdur or BB secondary to hypotension. 2.  Pulmonary - On room air. CXR this am shows cardiomegaly, trace bilateral pleural effusions, and left base atelectasis. Encourage incentive spirometer 3. AKI on CKD (stage IV)-baseline creatinine 2.5-2.9. Nephrology following 4.  Acute blood loss anemia - H and H yesterday 8.1 and 25.7. On Trinsicon 5. Await this am's labs  6. Volume overload-on Lasix 80 IV bid. He still has foley. Will discuss with Dr. Orvan Seen about removal 7. Has magic mouthwash ordered  for apparent thrush 8. Will remove EPW  Anthony Weltman M ZimmermanPA-C 01/03/2019,8:22 AM

## 2019-01-03 NOTE — Plan of Care (Signed)
  Problem: Cardiac: Goal: Will achieve and/or maintain hemodynamic stability Outcome: Progressing

## 2019-01-03 NOTE — Progress Notes (Signed)
Physical Therapy Treatment Patient Details Name: Anthony Hess MRN: [id number] DOB: 01-23-1956 Today's Date: 01/03/2019    History of Present Illness Patient is a 63 y/o male who presents s/p CABGx4 11/4. PMH includes MI, HTN, CAD, bladder cancer.    PT Comments    Pt limited by dizziness and orthostatic BP during session. Pt mobilized well in bed and during transfers, however becomes dizzy with all standing activity, BP dropping as documented below. Pt continues to demonstrate the need for PT cueing to reinforce sternal precautions. PT anticipates the patient will progress quickly with ambulation if BP remains stable. Pt will continue to benefit from PT POC for further gait and stair training.   Follow Up Recommendations  Home health PT;Supervision for mobility/OOB;Supervision/Assistance - 24 hour     Equipment Recommendations  Rolling walker with 5" wheels    Recommendations for Other Services       Precautions / Restrictions Precautions Precautions: Fall;Sternal Restrictions Weight Bearing Restrictions: No Other Position/Activity Restrictions: sternal    Mobility  Bed Mobility Overal bed mobility: Needs Assistance Bed Mobility: Sit to Supine;Supine to Sit     Supine to sit: Supervision Sit to supine: Supervision   General bed mobility comments: pt requires cueing to abide by sternal precautions  Transfers Overall transfer level: Needs assistance Equipment used: Rolling walker (2 wheeled) Transfers: Sit to/from Stand Sit to Stand: Supervision         General transfer comment: pt requires cueing to abide by sternal precautions  Ambulation/Gait Ambulation/Gait assistance: Min guard Gait Distance (Feet): 0 Feet Assistive device: Rolling walker (2 wheeled) Gait Pattern/deviations: Step-to pattern     General Gait Details: pt practicing marching in place at edge of bed for ~20 seconds, limited ambulation 2/2 dizziness and hypotension   Stairs              Wheelchair Mobility    Modified Rankin (Stroke Patients Only)       Balance Overall balance assessment: Needs assistance Sitting-balance support: Feet supported;No upper extremity supported Sitting balance-Leahy Scale: Good Sitting balance - Comments: modI   Standing balance support: Bilateral upper extremity supported Standing balance-Leahy Scale: Fair Standing balance comment: minG                            Cognition Arousal/Alertness: Awake/alert Behavior During Therapy: Impulsive Overall Cognitive Status: Within Functional Limits for tasks assessed                                        Exercises      General Comments General comments (skin integrity, edema, etc.): Pt with orthostatic hypotension during multiple attempts at standing with BP of 68/54 and 64/55 in standing. Pt with symptoms of dizziness.      Pertinent Vitals/Pain Pain Assessment: No/denies pain    Home Living                      Prior Function            PT Goals (current goals can now be found in the care plan section) Acute Rehab PT Goals Patient Stated Goal: to improve mobility Progress towards PT goals: Not progressing toward goals - comment(limited by orthostatic)    Frequency    Min 3X/week      PT Plan Current plan remains appropriate    Co-evaluation  AM-PAC PT "6 Clicks" Mobility   Outcome Measure  Help needed turning from your back to your side while in a flat bed without using bedrails?: None Help needed moving from lying on your back to sitting on the side of a flat bed without using bedrails?: None Help needed moving to and from a bed to a chair (including a wheelchair)?: None Help needed standing up from a chair using your arms (e.g., wheelchair or bedside chair)?: None Help needed to walk in hospital room?: A Little Help needed climbing 3-5 steps with a railing? : A Lot 6 Click Score: 21    End of  Session Equipment Utilized During Treatment: Gait belt Activity Tolerance: Treatment limited secondary to medical complications (Comment)(orthostatic) Patient left: in bed;with call bell/phone within reach;with bed alarm set Nurse Communication: Mobility status;Other (comment)(orthostatic) PT Visit Diagnosis: Unsteadiness on feet (R26.81);Muscle weakness (generalized) (M62.81);Difficulty in walking, not elsewhere classified (R26.2)     Time: 4196-2229 PT Time Calculation (min) (ACUTE ONLY): 17 min  Charges:  $Therapeutic Activity: 8-22 mins                     Zenaida Niece, PT, DPT Acute Rehabilitation Pager: [phone]    Zenaida Niece 01/03/2019, 1:14 PM

## 2019-01-03 NOTE — Progress Notes (Signed)
[facility] Progress Note  Subjective:  Continues to improve, looks much better today, awake, alert, interactive  Afebrile, stable vital signs  Serum creatinine continues to improve, 5.15 this morning, K3.4, BUN 96.  1.5 L urine output yesterday, weight down 1.6 kg  Vitals:   01/03/19 0419 01/03/19 0610 01/03/19 0715 01/03/19 1053  BP: 105/78  99/66 104/71  Pulse:   78 80  Resp: _0 Temp: 98 F (36.7 C)  98.1 F (36.7 C) 98.4 F (36.9 C)  TempSrc: Oral  Oral Oral  SpO2: 95%  94% 94%  Weight:  56 kg    Height:        Inpatient medications: . amiodarone  400 mg Oral BID  . aspirin EC  81 mg Oral Daily  . atorvastatin  80 mg Oral q1800  . bisacodyl  10 mg Oral Daily   Or  . bisacodyl  10 mg Rectal Daily  . Chlorhexidine Gluconate Cloth  6 each Topical Daily  . clopidogrel  75 mg Oral Daily  . docusate sodium  200 mg Oral Daily  . feeding supplement (PRO-STAT SUGAR FREE 64)  30 mL Oral BID  . ferrous [id number] C-folic acid  1 capsule Oral TID PC  . furosemide  80 mg Intravenous BID  . magic mouthwash  5 mL Oral TID  . mouth rinse  15 mL Mouth Rinse BID  . multivitamin  15 mL Oral Daily  . pantoprazole  40 mg Oral Daily  . potassium chloride  40 mEq Oral BID  . sodium chloride flush  3 mL Intravenous Q12H    influenza vac split quadrivalent PF, metoprolol tartrate, ondansetron (ZOFRAN) IV, oxyCODONE, pneumococcal 23 valent vaccine, traMADol    Exam: Gen alert,no distress, nasal O2 Diminished in the bases otherwise clear, normal work of breathing RRR no MRG, sternotomy scar and dressing Abd soft , nontender, slighlty distended, no mass or hsm GU normal male w/ foley draining normal appearing urine   MS no joint effusions or deformity Ext  trace edema FF Neuro is alert, Ox 3 , nf    Home meds:  - amlodipine 10 qd/ metoprolol xl 25  - isosorbide mononitrate 60  - nicotine patch/ bactrim DS bid   UA 11/2 - >50 wbc/ 0-5  rbc, few bact, 1.010, large LE/ +nitrite, 100 prot  CXR 11/2 - normal  CXR  11/4- 11/7 > vasc congestion + IS edema mild pattern  I/o since admission = 8.3L in and 7.6 L out  Creat 11/2 = 2.90 > 2.95 on 11/5 > 4.18 yest >  5.27 today   Assessment/ Plan: 1. AKI on CKD IV - baseline eGFR 69m/min , baseline creat 2.5- 2.9. AKI here could be hypoperfusion from hypotension, decomp HF/ vol ^, ATN from vanc toxicity w/ vanc level 18 three days after surgery, or a combination.  Recovering GFR and is responsive to Lasix.convert to oral dosing today, 80 mg twice daily  2. 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 Hypotension - bettter, as above 4. Hyperkalemia -resolved 5. Vol excess -significantly improved. As above   RRexene Agent 01/03/2019, 12:03 PM  Iron/TIBC/Ferritin/ %Sat No results found for: IRON, TIBC, FERRITIN, IRONPCTSAT Recent Labs  Lab 12/27/18 1540  01/03/19 0852  NA  --    < > 142  K  --    < > 3.4*  CL  --    < > 100  CO2  --    < >  26  GLUCOSE  --    < > 138*  BUN  --    < > 96*  CREATININE  --    < > 5.15*  CALCIUM  --    < > 6.7*  ALBUMIN  --    < > 2.5*  INR 1.3*  --   --    < > = values in this interval not displayed.   Recent Labs  Lab 01/03/19 0852  AST 27  ALT 25  ALKPHOS 65  BILITOT 0.5  PROT 6.0*   Recent Labs  Lab 01/03/19 0852  WBC 8.3  HGB 8.8*  HCT 27.9*  PLT 258

## 2019-01-04 ENCOUNTER — Inpatient Hospital Stay (HOSPITAL_COMMUNITY): Payer: Medicaid Other

## 2019-01-04 LAB — CBC WITH DIFFERENTIAL/PLATELET
Abs Immature Granulocytes: 0.05 10*3/uL (ref 0.00–0.07)
Basophils Absolute: 0 10*3/uL (ref 0.0–0.1)
Basophils Relative: 0 %
Eosinophils Absolute: 0.5 10*3/uL (ref 0.0–0.5)
Eosinophils Relative: 6 %
HCT: 27.5 % — ABNORMAL LOW (ref 39.0–52.0)
Hemoglobin: 9 g/dL — ABNORMAL LOW (ref 13.0–17.0)
Immature Granulocytes: 1 %
Lymphocytes Relative: 10 %
Lymphs Abs: 0.9 10*3/uL (ref 0.7–4.0)
MCH: 27.9 pg (ref 26.0–34.0)
MCHC: 32.7 g/dL (ref 30.0–36.0)
MCV: 85.1 fL (ref 80.0–100.0)
Monocytes Absolute: 1.3 10*3/uL — ABNORMAL HIGH (ref 0.1–1.0)
Monocytes Relative: 14 %
Neutro Abs: 6.7 10*3/uL (ref 1.7–7.7)
Neutrophils Relative %: 69 %
Platelets: 278 10*3/uL (ref 150–400)
RBC: 3.23 MIL/uL — ABNORMAL LOW (ref 4.22–5.81)
RDW: 14.6 % (ref 11.5–15.5)
WBC: 9.5 10*3/uL (ref 4.0–10.5)
nRBC: 0 % (ref 0.0–0.2)

## 2019-01-04 LAB — BASIC METABOLIC PANEL
Anion gap: 15 (ref 5–15)
BUN: 95 mg/dL — ABNORMAL HIGH (ref 6–20)
CO2: 25 mmol/L (ref 22–32)
Calcium: 6.9 mg/dL — ABNORMAL LOW (ref 8.9–10.3)
Chloride: 101 mmol/L (ref 98–111)
Creatinine, Ser: 5.01 mg/dL — ABNORMAL HIGH (ref 0.61–1.24)
GFR calc Af Amer: 13 mL/min — ABNORMAL LOW (ref >60)
GFR calc non Af Amer: 12 mL/min — ABNORMAL LOW (ref >60)
Glucose, Bld: 131 mg/dL — ABNORMAL HIGH (ref 70–99)
Potassium: 3.4 mmol/L — ABNORMAL LOW (ref 3.5–5.1)
Sodium: 141 mmol/L (ref 135–145)

## 2019-01-04 MED ORDER — ADULT MULTIVITAMIN W/MINERALS CH
1.0000 | ORAL_TABLET | Freq: Every day | ORAL | Status: DC
  Administered 2019-01-04: 1 via ORAL
  Filled 2019-01-04 (×2): qty 1

## 2019-01-04 NOTE — Discharge Instructions (Signed)
Discharge Instructions:  1. You may shower, please wash incisions daily with soap and water and keep dry.  If you wish to cover wounds with dressing you may do so but please keep clean and change daily.  No tub baths or swimming until incisions have completely healed.  If your incisions become red or develop any drainage please call our office at [phone]  2. No Driving until cleared by Dr. Orvan Seen office and you are no longer using narcotic pain medications  3. Monitor your weight daily.. Please use the same scale and weigh at same time... If you gain 5-10 lbs in 48 hours with associated lower extremity swelling, please contact our office at [phone]  4. Fever of 101.5 for at least 24 hours with no source, please contact our office at [phone]  5. Activity- up as tolerated, please walk at least 3 times per day.  Avoid strenuous activity, no lifting, pushing, or pulling with your arms over 8-10 lbs for a minimum of 6 weeks  6. If any questions or concerns arise, please do not hesitate to contact our office at [place] [facility] When your kidneys are not working well, they cannot remove waste and excess substances from your blood as effectively as they did before. This can lead to a buildup and imbalance of these substances, which can worsen kidney damage and affect how your body functions. Certain foods lead to a buildup of these substances in the body. By changing your diet as recommended by your diet and nutrition specialist (dietitian) or health care provider, you could help prevent further kidney damage and delay or prevent the need for dialysis. What are tips for following this plan? General instructions   Work with your health care provider and dietitian to develop a meal plan that is right for you. Foods you can eat, limit, or avoid will be different for each person depending on the stage of kidney disease and any other existing health  conditions.  Talk with your health care provider about whether you should take a vitamin and mineral supplement.  Use standard measuring cups and spoons to measure servings of foods. Use a kitchen scale to measure portions of protein foods.  If directed by your health care provider, avoid drinking too much fluid. Measure and count all liquids, including water, ice, soups, flavored gelatin, and frozen desserts such as popsicles or ice cream. Reading food labels  Check the amount of sodium in foods. Choose foods that have less than 300 milligrams (mg) per serving.  Check the ingredient list for phosphorus or potassium-based additives or preservatives.  Check the amount of saturated and trans fat. Limit or avoid these fats as told by your dietitian. Shopping  Avoid buying foods that are: ? Processed, frozen, or prepackaged. ? Calcium-enriched or fortified.  Do not buy foods that have salt or sodium listed among the first five ingredients.  Do not buy canned vegetables. Cooking  Replace animal proteins, such as meat, fish, eggs, or dairy, with plant proteins from beans, nuts, and soy. ? Use soy milk instead of cow's milk. ? Add beans or tofu to soups, casseroles, or pasta dishes instead of meat.  Soak vegetables, such as potatoes, before cooking to reduce potassium. To do this: ? Peel and cut into small pieces. ? Soak in warm water for at least 2 hours. For every 1 cup of vegetables, use 10 cups of water. ? Drain and rinse with warm water. ? Boil  for at least 5 minutes. Meal planning  Limit the amount of protein from plant and animal sources you eat each day.  Do not add salt to food when cooking or before eating.  Eat meals and snacks at around the same time each day. If you have diabetes:  If you have diabetes (diabetes mellitus) and chronic kidney disease, it is important to keep your blood glucose in the target range recommended by your health care provider. Follow your  diabetes management plan. This may include: ? Checking your blood glucose regularly. ? Taking oral medicines, insulin, or both. ? Exercising for at least 30 minutes on 5 or more days each week, or as told by your health care provider. ? Tracking how many servings of carbohydrates you eat at each meal.  You may be given specific guidelines on how much of certain foods and nutrients you may eat, depending on your stage of kidney disease and whether you have high blood pressure (hypertension). Follow your meal plan as told by your dietitian. What nutrients should be limited? The items listed are not a complete list. Talk with your dietitian about what dietary choices are best for you. Potassium Potassium affects how steadily your heart beats. If too much potassium builds up in your blood, it can cause an irregular heartbeat or even a heart attack. You may need to eat less potassium, depending on your blood potassium levels and the stage of kidney disease. Talk to your dietitian about how much potassium you may have each day. You may need to limit or avoid foods that are high in potassium, such as:  Milk and soy milk.  Fruits, such as bananas, papaya, apricots, nectarines, melon, prunes, raisins, kiwi, and oranges.  Vegetables, such as potatoes, sweet potatoes, yams, tomatoes, leafy greens, beets, okra, avocado, pumpkin, and winter squash.  White and lima beans. Phosphorus Phosphorus is a mineral found in your bones. A balance between calcium and phosphorous is needed to build and maintain healthy bones. Too much phosphorus pulls calcium from your bones. This can make your bones weak and more likely to break. Too much phosphorus can also make your skin itch. You may need to eat less phosphorus depending on your blood phosphorus levels and the stage of kidney disease. Talk to your dietitian about how much potassium you may have each day. You may need to take medicine to lower your blood phosphorus  levels if diet changes do not help. You may need to limit or avoid foods that are high in phosphorus, such as:  Milk and dairy products.  Dried beans and peas.  Tofu, soy milk, and other soy-based meat replacements.  Colas.  Nuts and peanut butter.  Meat, poultry, and fish.  Bran cereals and oatmeals. Protein Protein helps you to make and keep muscle. It also helps in the repair of your bodys cells and tissues. One of the natural breakdown products of protein is a waste product called urea. When your kidneys are not working properly, they cannot remove wastes, such as urea, like they did before you developed chronic kidney disease. Reducing how much protein you eat can help prevent a buildup of urea in your blood. Depending on your stage of kidney disease, you may need to limit foods that are high in protein. Sources of animal protein include:  Meat (all types).  Fish and seafood.  Poultry.  Eggs.  Dairy. Other protein foods include:  Beans and legumes.  Nuts and nut butter.  Soy and tofu.  Sodium Sodium, which is found in salt, helps maintain a healthy balance of fluids in your body. Too much sodium can increase your blood pressure and have a negative effect on the function of your heart and lungs. Too much sodium can also cause your body to retain too much fluid, making your kidneys work harder. Most people should have less than 2,300 milligrams (mg) of sodium each day. If you have hypertension, you may need to limit your sodium to 1,500 mg each day. Talk to your dietitian about how much sodium you may have each day. You may need to limit or avoid foods that are high in sodium, such as:  Salt seasonings.  Soy sauce.  Cured and processed meats.  Salted crackers and snack foods.  Fast food.  Canned soups and most canned foods.  Pickled foods.  Vegetable juice.  Boxed mixes or ready-to-eat boxed meals and side dishes.  Bottled dressings, sauces, and  marinades. Summary  Chronic kidney disease can lead to a buildup and imbalance of waste and excess substances in the body. Certain foods lead to a buildup of these substances. By adjusting your intake of these foods, you could help prevent more kidney damage and delay or prevent the need for dialysis.  Food adjustments are different for each person with chronic kidney disease. Work with a dietitian to set up nutrient goals and a meal plan that is right for you.  If you have diabetes and chronic kidney disease, it is important to keep your blood glucose in the target range recommended by your health care provider. This information is not intended to replace advice given to you by your health care provider. Make sure you discuss any questions you have with your health care provider. Document Released: 05/01/2002 Document Revised: 06/01/2018 Document Reviewed: 02/04/2016 Elsevier Patient Education  2020 [employer].

## 2019-01-04 NOTE — Progress Notes (Signed)
      [address]       [place] [address]             [phone]        8 Days Post-Op Procedure(s) (LRB): CORONARY ARTERY BYPASS GRAFTING (CABG) X 4 ON PUMP USING RIGHT & LEFT INTERNAL MAMMARY ARTERY LEFT RADIAL ARTERY ENDOSCOPICALLY HARVESTED (N/A) ENDOSCOPIC RADIAL ARTERY HARVEST (Left) TRANSESOPHAGEAL ECHOCARDIOGRAM (TEE) (N/A) INDOCYANINE GREEN FLUORESCENCE IMAGING (ICG) (N/A)  Subjective: Patient just finished walking around the unit. He has no specific complaints this am  Objective: Vital signs in last 24 hours: Temp:  [97.5 F (36.4 C)-98.4 F (36.9 C)] 97.8 F (36.6 C) (11/12 0758) Pulse Rate:  [75-80] 75 (11/12 0759) Cardiac Rhythm: Normal sinus rhythm (11/12 0807) Resp:  [17-23] 20 (11/12 0759) BP: (89-108)/(63-73) 108/72 (11/12 0759) SpO2:  [92 %-100 %] 100 % (11/12 0759) Weight:  [55.8 kg] 55.8 kg (11/12 0401)  Pre op weight 64.2 kg Current Weight  01/04/19 55.8 kg       Intake/Output from previous day: 11/11 0701 - 11/12 0700 In: 1320 [P.O.:1320] Out: 3750 [Urine:3750]   Physical Exam:  Cardiovascular: RRR Pulmonary: Slightly diminished bibasilar breath sounds Abdomen: Soft, non tender, bowel sounds present. Extremities: No lower extremity edema. Wounds: Clean and dry.  No erythema or signs of infection.  Lab Results: CBC: Recent Labs    01/03/19 0852 01/04/19 0248  WBC 8.3 9.5  HGB 8.8* 9.0*  HCT 27.9* 27.5*  PLT 258 278   BMET:  Recent Labs    01/03/19 0852 01/04/19 0248  NA 142 141  K 3.4* 3.4*  CL 100 101  CO2 26 25  GLUCOSE 138* 131*  BUN 96* 95*  CREATININE 5.15* 5.01*  CALCIUM 6.7* 6.9*    PT/INR:  Lab Results  Component Value Date   INR 1.3 (H) 12/27/2018   INR 1.0 12/25/2018   ABG:  INR: Will add last result for INR, ABG once components are confirmed Will add last 4 CBG results once components are confirmed  Assessment/Plan:  1. CV - SR in the 80's.  On Amiodarone 400 mg bid, and  Plavix 75 mg daily. Not on Imdur or BB secondary to hypotension. Will decrease Amiodarone in am. 2.  Pulmonary - On room air. CXR this am shows cardiomegaly, trace bilateral pleural effusions, and left base atelectasis. Encourage incentive spirometer 3. AKI on CKD (stage IV)-baseline creatinine 2.5-2.9. Nephrology following 4.  Acute blood loss anemia - H and H yesterday 8.1 and 25.7. On Trinsicon 6. Volume overload-on Lasix 80 PO bid.  7. Has magic mouthwash ordered  for apparent thrush 8. Will arrange for home PT and likely discharge in am  Byran Bilotti M ZimmermanPA-C 01/04/2019,8:21 AM

## 2019-01-04 NOTE — Progress Notes (Signed)
CARDIAC REHAB PHASE I   PRE:  Rate/Rhythm: 65 SR with PACs  BP:  Sitting: 102/74     SaO2: 97 RA  MODE:  Ambulation: 150 ft   POST:  Rate/Rhythm: 88 SR with PACs  BP:  Sitting: 116/83    SaO2: 97 RA  After some convincing, pt ambulated 114ft in hallway assist of one with gait belt and front wheel walker. Tried to encourage pt to walk further, but pt stated he needed to rest. Pt returned to bed, bed alarm on, call bell and bedside table within reach. Will continue to follow.  [id number] Rufina Falco, RN BSN 01/04/2019 2:15 PM

## 2019-01-04 NOTE — Progress Notes (Signed)
Physical Therapy Treatment Patient Details Name: Anthony Hess MRN: [id number] DOB: 01-30-1956 Today's Date: 01/04/2019    History of Present Illness Patient is a 63 y/o male who presents s/p CABGx4 11/4. PMH includes MI, HTN, CAD, bladder cancer.    PT Comments    Pt tolerated treatment well, ambulating out of room for limited community distances without symptoms of orthostasis. Pt demonstrates steady gait with use of RW. Pt's endurance remains limited compared to baseline and pt continues to require verbal cues for sternal precautions. Pt will continue to benefit from PT POC to aide in a return to PLOF.  Follow Up Recommendations  Home health PT;Supervision for mobility/OOB;Supervision/Assistance - 24 hour     Equipment Recommendations  None recommended by PT(pt reports owning a RW)    Recommendations for Other Services       Precautions / Restrictions Precautions Precautions: Fall;Sternal Restrictions Weight Bearing Restrictions: No Other Position/Activity Restrictions: sternal    Mobility  Bed Mobility Overal bed mobility: Needs Assistance Bed Mobility: Supine to Sit;Sit to Supine     Supine to sit: Supervision Sit to supine: Supervision   General bed mobility comments: pt requires verbal cues to maintain sternal precautions  Transfers Overall transfer level: Needs assistance Equipment used: Rolling walker (2 wheeled) Transfers: Sit to/from Stand Sit to Stand: Supervision         General transfer comment: pt requiring verbal cues to maintain sternal precautions  Ambulation/Gait Ambulation/Gait assistance: Supervision Gait Distance (Feet): 150 Feet Assistive device: Rolling walker (2 wheeled) Gait Pattern/deviations: Step-through pattern Gait velocity: decreased Gait velocity interpretation: 1.31 - 2.62 ft/sec, indicative of limited community ambulator General Gait Details: steady step through gait pattern, otherwise unremarkable   Stairs             Wheelchair Mobility    Modified Rankin (Stroke Patients Only)       Balance Overall balance assessment: Modified Independent Sitting-balance support: Feet supported;No upper extremity supported Sitting balance-Leahy Scale: Good Sitting balance - Comments: modI   Standing balance support: Bilateral upper extremity supported Standing balance-Leahy Scale: Good Standing balance comment: modI                            Cognition Arousal/Alertness: Awake/alert Behavior During Therapy: Impulsive Overall Cognitive Status: Within Functional Limits for tasks assessed                                        Exercises      General Comments General comments (skin integrity, edema, etc.): pt VSS during session, no reports of orthostatic symptoms      Pertinent Vitals/Pain Pain Assessment: No/denies pain    Home Living                      Prior Function            PT Goals (current goals can now be found in the care plan section) Acute Rehab PT Goals Patient Stated Goal: to improve mobility Progress towards PT goals: Progressing toward goals    Frequency    Min 3X/week      PT Plan Current plan remains appropriate    Co-evaluation              AM-PAC PT "6 Clicks" Mobility   Outcome Measure  Help needed turning from your back to your  side while in a flat bed without using bedrails?: None Help needed moving from lying on your back to sitting on the side of a flat bed without using bedrails?: None Help needed moving to and from a bed to a chair (including a wheelchair)?: None Help needed standing up from a chair using your arms (e.g., wheelchair or bedside chair)?: None Help needed to walk in hospital room?: None Help needed climbing 3-5 steps with a railing? : A Little 6 Click Score: 23    End of Session Equipment Utilized During Treatment: Gait belt Activity Tolerance: Patient tolerated treatment well Patient  left: in bed;with call bell/phone within reach Nurse Communication: Mobility status PT Visit Diagnosis: Unsteadiness on feet (R26.81)     Time: 2505-3976 PT Time Calculation (min) (ACUTE ONLY): 11 min  Charges:  $Gait Training: 8-22 mins                     Zenaida Niece, PT, DPT Acute Rehabilitation Pager: [phone]    Zenaida Niece 01/04/2019, 11:14 AM

## 2019-01-04 NOTE — Plan of Care (Signed)
°  Problem: Education: °Goal: Will demonstrate proper wound care and an understanding of methods to prevent future damage °Outcome: Progressing °Goal: Knowledge of disease or condition will improve °Outcome: Progressing °Goal: Knowledge of the prescribed therapeutic regimen will improve °Outcome: Progressing °Goal: Individualized Educational Video(s) °Outcome: Progressing °  °Problem: Activity: °Goal: Risk for activity intolerance will decrease °Outcome: Progressing °  °Problem: Cardiac: °Goal: Will achieve and/or maintain hemodynamic stability °Outcome: Progressing °  °Problem: Clinical Measurements: °Goal: Postoperative complications will be avoided or minimized °Outcome: Progressing °  °Problem: Respiratory: °Goal: Respiratory status will improve °Outcome: Progressing °  °Problem: Skin Integrity: °Goal: Wound healing without signs and symptoms of infection °Outcome: Progressing °Goal: Risk for impaired skin integrity will decrease °Outcome: Progressing °  °Problem: Urinary Elimination: °Goal: Ability to achieve and maintain adequate renal perfusion and functioning will improve °Outcome: Progressing °  °Problem: Education: °Goal: Knowledge of General Education information will improve °Description: Including pain rating scale, medication(s)/side effects and non-pharmacologic comfort measures °Outcome: Progressing °  °Problem: Health Behavior/Discharge Planning: °Goal: Ability to manage health-related needs will improve °Outcome: Progressing °  °Problem: Clinical Measurements: °Goal: Ability to maintain clinical measurements within normal limits will improve °Outcome: Progressing °Goal: Will remain free from infection °Outcome: Progressing °Goal: Diagnostic test results will improve °Outcome: Progressing °Goal: Respiratory complications will improve °Outcome: Progressing °Goal: Cardiovascular complication will be avoided °Outcome: Progressing °  °Problem: Activity: °Goal: Risk for activity intolerance will  decrease °Outcome: Progressing °  °Problem: Nutrition: °Goal: Adequate nutrition will be maintained °Outcome: Progressing °  °Problem: Coping: °Goal: Level of anxiety will decrease °Outcome: Progressing °  °Problem: Elimination: °Goal: Will not experience complications related to bowel motility °Outcome: Progressing °Goal: Will not experience complications related to urinary retention °Outcome: Progressing °  °Problem: Pain Managment: °Goal: General experience of comfort will improve °Outcome: Progressing °  °Problem: Safety: °Goal: Ability to remain free from injury will improve °Outcome: Progressing °  °Problem: Skin Integrity: °Goal: Risk for impaired skin integrity will decrease °Outcome: Progressing °  °

## 2019-01-04 NOTE — Progress Notes (Signed)
[facility] Progress Note  Subjective:  No c/o  Afebrile, stable vital signs  Excellent UOP, 2.4L Negative again, on PO diuretics  SCr cont to improve, K 3.4  Vitals:   01/04/19 0758 01/04/19 0759 01/04/19 1034 01/04/19 1525  BP: 108/72 108/72 107/87 102/73  Pulse: 78 75 78 78  Resp: _0 Temp: 97.8 F (36.6 C)  97.8 F (36.6 C) 98.3 F (36.8 C)  TempSrc: Oral  Oral Oral  SpO2: 93% 100% 95% 93%  Weight:      Height:        Inpatient medications: . amiodarone  400 mg Oral BID  . aspirin EC  81 mg Oral Daily  . atorvastatin  80 mg Oral q1800  . bisacodyl  10 mg Oral Daily   Or  . bisacodyl  10 mg Rectal Daily  . Chlorhexidine Gluconate Cloth  6 each Topical Daily  . clopidogrel  75 mg Oral Daily  . docusate sodium  200 mg Oral Daily  . feeding supplement (PRO-STAT SUGAR FREE 64)  30 mL Oral BID  . ferrous [id number] C-folic acid  1 capsule Oral TID PC  . furosemide  80 mg Oral BID  . magic mouthwash  5 mL Oral TID  . mouth rinse  15 mL Mouth Rinse BID  . multivitamin with minerals  1 tablet Oral Daily  . pantoprazole  40 mg Oral Daily  . sodium chloride flush  3 mL Intravenous Q12H    influenza vac split quadrivalent PF, metoprolol tartrate, ondansetron (ZOFRAN) IV, oxyCODONE, pneumococcal 23 valent vaccine, traMADol    Exam: Gen alert,no distress, nasal O2 Diminished in the bases otherwise clear, normal work of breathing RRR no MRG, sternotomy scar and dressing Abd soft , nontender, slighlty distended, no mass or hsm GU normal male w/ foley draining normal appearing urine   MS no joint effusions or deformity Ext  trace edema FF Neuro is alert, Ox 3 , nf    Home meds:  - amlodipine 10 qd/ metoprolol xl 25  - isosorbide mononitrate 60  - nicotine patch/ bactrim DS bid   UA 11/2 - >50 wbc/ 0-5 rbc, few bact, 1.010, large LE/ +nitrite, 100 prot  CXR 11/2 - normal  CXR  11/4- 11/7 > vasc congestion + IS edema mild  pattern  I/o since admission = 8.3L in and 7.6 L out  Creat 11/2 = 2.90 > 2.95 on 11/5 > 4.18 yest >  5.27 today   Assessment/ Plan: 1. AKI on CKD IV - baseline eGFR 72m/min , baseline creat 2.5- 2.9. AKI here could be hypoperfusion from hypotension, decomp HF/ vol ^, ATN from vanc toxicity w/ vanc level 18 three days after surgery, or a combination.  Recovering GFR and is responsive to Lasix.cont oral dosing today, 80 mg twice daily  2. 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 Hypotension - bettter, as above 4. Hyperkalemia -stable 5. Vol excess -significantly improved. As above   RRexene Agent 01/04/2019, 3:42 PM  Iron/TIBC/Ferritin/ %Sat No results found for: IRON, TIBC, FERRITIN, IRONPCTSAT Recent Labs  Lab 01/03/19 0852 01/04/19 0248  NA 142 141  K 3.4* 3.4*  CL 100 101  CO2 26 25  GLUCOSE 138* 131*  BUN 96* 95*  CREATININE 5.15* 5.01*  CALCIUM 6.7* 6.9*  ALBUMIN 2.5*  --    Recent Labs  Lab 01/03/19 0852  AST 27  ALT 25  ALKPHOS 65  BILITOT 0.5  PROT 6.0*  Recent Labs  Lab 01/04/19 0248  WBC 9.5  HGB 9.0*  HCT 27.5*  PLT 278

## 2019-01-05 ENCOUNTER — Other Ambulatory Visit: Payer: Self-pay | Admitting: Cardiothoracic Surgery

## 2019-01-05 DIAGNOSIS — N186 End stage renal disease: Secondary | ICD-10-CM

## 2019-01-05 DIAGNOSIS — I482 Chronic atrial fibrillation, unspecified: Secondary | ICD-10-CM

## 2019-01-05 DIAGNOSIS — D631 Anemia in chronic kidney disease: Secondary | ICD-10-CM | POA: Insufficient documentation

## 2019-01-05 DIAGNOSIS — N184 Chronic kidney disease, stage 4 (severe): Secondary | ICD-10-CM

## 2019-01-05 DIAGNOSIS — I4891 Unspecified atrial fibrillation: Secondary | ICD-10-CM

## 2019-01-05 DIAGNOSIS — I2510 Atherosclerotic heart disease of native coronary artery without angina pectoris: Secondary | ICD-10-CM

## 2019-01-05 MED ORDER — ATORVASTATIN CALCIUM 40 MG PO TABS: 40.0000 mg | ORAL_TABLET | Freq: Every day | ORAL | 2 refills | Status: DC

## 2019-01-05 MED ORDER — ASPIRIN 81 MG PO TBEC: 81.0000 mg | DELAYED_RELEASE_TABLET | Freq: Every day | ORAL | Status: DC

## 2019-01-05 MED ORDER — TRAMADOL HCL 50 MG PO TABS: 50.0000 mg | ORAL_TABLET | ORAL | 0 refills | Status: AC | PRN

## 2019-01-05 MED ORDER — FUROSEMIDE 80 MG PO TABS: 80.0000 mg | ORAL_TABLET | Freq: Every day | ORAL | 0 refills | Status: DC

## 2019-01-05 MED ORDER — CLOPIDOGREL BISULFATE 75 MG PO TABS: 75.0000 mg | ORAL_TABLET | Freq: Every day | ORAL | 3 refills | Status: DC

## 2019-01-05 MED ORDER — AMIODARONE HCL 200 MG PO TABS: 200.0000 mg | ORAL_TABLET | Freq: Two times a day (BID) | ORAL | 2 refills | Status: DC

## 2019-01-05 NOTE — Progress Notes (Signed)
[facility] Progress Note  Subjective:  No c/o  Creatinine continues to slowly improve, excellent urine output on oral Lasix  Potassium 3.4, bicarbonate 25  His baseline creatinine was around 3  Vitals:   01/04/19 2348 01/05/19 0300 01/05/19 0534 01/05/19 0800  BP: 94/65 97/67  90/60  Pulse: 77 73    Resp: 20 17  15   Temp: 98.1 F (36.7 C) 97.7 F (36.5 C)  97.6 F (36.4 C)  TempSrc: Oral Oral  Oral  SpO2: 99% 98%  98%  Weight:   54.4 kg   Height:        Inpatient medications: . amiodarone  400 mg Oral BID  . aspirin EC  81 mg Oral Daily  . atorvastatin  80 mg Oral q1800  . bisacodyl  10 mg Oral Daily   Or  . bisacodyl  10 mg Rectal Daily  . Chlorhexidine Gluconate Cloth  6 each Topical Daily  . clopidogrel  75 mg Oral Daily  . docusate sodium  200 mg Oral Daily  . feeding supplement (PRO-STAT SUGAR FREE 64)  30 mL Oral BID  . ferrous [id number] C-folic acid  1 capsule Oral TID PC  . furosemide  80 mg Oral BID  . magic mouthwash  5 mL Oral TID  . mouth rinse  15 mL Mouth Rinse BID  . multivitamin with minerals  1 tablet Oral Daily  . pantoprazole  40 mg Oral Daily  . sodium chloride flush  3 mL Intravenous Q12H    influenza vac split quadrivalent PF, metoprolol tartrate, ondansetron (ZOFRAN) IV, oxyCODONE, traMADol    Exam: Gen alert,no distress, nasal O2 Diminished in the bases otherwise clear, normal work of breathing RRR no MRG, sternotomy scar and dressing Abd soft , nontender, slighlty distended, no mass or hsm GU normal male w/ foley draining normal appearing urine   MS no joint effusions or deformity Ext  trace edema FF Neuro is alert, Ox 3 , nf    Home meds:  - amlodipine 10 qd/ metoprolol xl 25  - isosorbide mononitrate 60  - nicotine patch/ bactrim DS bid   UA 11/2 - >50 wbc/ 0-5 rbc, few bact, 1.010, large LE/ +nitrite, 100 prot  CXR 11/2 - normal  CXR  11/4- 11/7 > vasc congestion + IS edema mild pattern  I/o since admission = 8.3L in and 7.6 L out  Creat 11/2 = 2.90 > 2.95 on 11/5 > 4.18 yest >  5.27 today   Assessment/ Plan: 1. Resolving AKI on CKD IV - baseline eGFR 11m/min , baseline creat ~3.  No further suggestions, continue Lasix at current dosing.  [facility]   2. 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 Hypotension -stable 4. Hyperkalemia -stable 5. Vol excess -significantly improved. As above   RRexene Agent 01/05/2019, 11:54 AM  Iron/TIBC/Ferritin/ %Sat No results found for: IRON, TIBC, FERRITIN, IRONPCTSAT Recent Labs  Lab 01/03/19 0852 01/04/19 0248  NA 142 141  K 3.4* 3.4*  CL 100 101  CO2 26 25  GLUCOSE 138* 131*  BUN 96* 95*  CREATININE 5.15* 5.01*  CALCIUM 6.7* 6.9*  ALBUMIN 2.5*  --    Recent Labs  Lab 01/03/19 0852  AST 27  ALT 25  ALKPHOS 65  BILITOT 0.5  PROT 6.0*   Recent Labs  Lab 01/04/19 0248  WBC 9.5  HGB 9.0*  HCT 27.5*  PLT 278

## 2019-01-05 NOTE — TOC Transition Note (Signed)
Transition of Care [facility]) - CM/SW Discharge Note   Patient Details  Name: Anthony Hess MRN: [id number] Date of Birth: 01/11/1956  [facility]) CM/SW Contact:  Zenon Mayo, RN Phone Number: 01/05/2019, 10:48 AM   Clinical Narrative:    Patient for dc home today, he is refusing HHPT, but states he would like to have a walker.  NCM contacted Zack with [employer] and they will bring the walker to patient's room.  He has no other needs.   Final next level of care: [place] Barriers to Discharge: No Barriers Identified   Patient Goals and CMS Choice Patient states their goals for this hospitalization and ongoing recovery are:: go home CMS Medicare.gov Compare Post Acute Care list provided to:: Patient Choice offered to / list presented to : Patient  Discharge Placement                       Discharge Plan and Services                DME Arranged: Walker rolling DME Agency: [employer] Date DME Agency Contacted: 01/05/19 Time DME Agency Contacted: [phone] Representative spoke with at DME Agency: zack HH Arranged: Refused HH          Social Determinants of Health ([place]) Interventions     Readmission Risk Interventions No flowsheet data found.

## 2019-01-05 NOTE — Progress Notes (Signed)
Discharge instructions reviewed with patient.  Understanding verbalized and written instructions given to patient.  His ride will be at the hospital at 3pm.

## 2019-01-05 NOTE — Progress Notes (Unsigned)
b

## 2019-01-05 NOTE — Progress Notes (Signed)
Patient refused all am medications.  Stated " I will take them when I get home"

## 2019-01-05 NOTE — Progress Notes (Signed)
Discussed sternal precautions, IS (got him a new one, 1500 mL), exercise, smoking cessation, diet, and CRPII. Pt receptive. He quit smoking 1 month ago. Discussed strategies to stay quit. Will refer to [place]. Encouraged pt to take meds appropriately. [place], ACSM 11:23 AM 01/05/2019

## 2019-01-05 NOTE — Progress Notes (Signed)
9 Days Post-Op Procedure(s) (LRB): CORONARY ARTERY BYPASS GRAFTING (CABG) X 4 ON PUMP USING RIGHT & LEFT INTERNAL MAMMARY ARTERY LEFT RADIAL ARTERY ENDOSCOPICALLY HARVESTED (N/A) ENDOSCOPIC RADIAL ARTERY HARVEST (Left) TRANSESOPHAGEAL ECHOCARDIOGRAM (TEE) (N/A) INDOCYANINE GREEN FLUORESCENCE IMAGING (ICG) (N/A) Subjective: Awake and alert. Says he feels well and is ready to return home.   Objective: Vital signs in last 24 hours: Temp:  [97.5 F (36.4 C)-98.4 F (36.9 C)] 97.6 F (36.4 C) (11/13 0800) Pulse Rate:  [73-86] 73 (11/13 0300) Cardiac Rhythm: Normal sinus rhythm;Bundle branch block (11/13 0746) Resp:  [15-20] 15 (11/13 0800) BP: (90-107)/(60-87) 90/60 (11/13 0800) SpO2:  [93 %-99 %] 98 % (11/13 0800) Weight:  [54.4 kg] 54.4 kg (11/13 0534)    Intake/Output from previous day: 11/12 0701 - 11/13 0700 In: 1640 [P.O.:1640] Out: 1825 [Urine:1825] Intake/Output this shift: No intake/output data recorded.  General appearance: alert, cooperative and no distress Neurologic: intact Heart: regular rate and rhythm Lungs: Breath sounds clear. Extremities: No edema.  Wound: Sternotomy incision is healing well.   Lab Results: Recent Labs    01/03/19 0852 01/04/19 0248  WBC 8.3 9.5  HGB 8.8* 9.0*  HCT 27.9* 27.5*  PLT 258 278   BMET:  Recent Labs    01/03/19 0852 01/04/19 0248  NA 142 141  K 3.4* 3.4*  CL 100 101  CO2 26 25  GLUCOSE 138* 131*  BUN 96* 95*  CREATININE 5.15* 5.01*  CALCIUM 6.7* 6.9*    PT/INR: No results for input(s): LABPROT, INR in the last 72 hours. ABG    Component Value Date/Time   PHART 7.253 (L) 12/28/2018 1430   HCO3 20.3 12/28/2018 1430   TCO2 22 12/28/2018 1430   ACIDBASEDEF 7.0 (H) 12/28/2018 1430   O2SAT 88.0 12/28/2018 1430   CBG (last 3)  No results for input(s): GLUCAP in the last 72 hours.  Assessment/Plan: S/P Procedure(s) (LRB): CORONARY ARTERY BYPASS GRAFTING (CABG) X 4 ON PUMP USING RIGHT & LEFT INTERNAL MAMMARY  ARTERY LEFT RADIAL ARTERY ENDOSCOPICALLY HARVESTED (N/A) ENDOSCOPIC RADIAL ARTERY HARVEST (Left) TRANSESOPHAGEAL ECHOCARDIOGRAM (TEE) (N/A) INDOCYANINE GREEN FLUORESCENCE IMAGING (ICG) (N/A)  -POD9 CABG x 4 with arterial conduit. Stable cardiac status and mobility is progressing. Plan to discharge with home health PT today. He said he has a walker at home. Continue Plavix, and ASA 81mg  along with Lipitor.   -Post-op A-fib Flutter- had a short burst of Flutter this AM, o/w SR. Discharge on amiodarone 200mg  BID.  -Acute on chronic renal failure- Urine output  1859ml yesterday. Creat 5.01 and trending down with baseline of 3.76. current wt is well below pre-op.  -Expected acute blood loss anemia- Hgb is stable.     LOS: 9 days    Antony Odea, [place] [phone] 01/05/2019

## 2019-01-07 LAB — TYPE AND SCREEN
ABO/RH(D): A POS
Antibody Screen: NEGATIVE
Unit division: 0
Unit division: 0
Unit division: 0
Unit division: 0

## 2019-01-07 LAB — BPAM RBC
Blood Product Expiration Date: 202011302359
Blood Product Expiration Date: 202011302359
Blood Product Expiration Date: 202011302359
Blood Product Expiration Date: 202011302359
ISSUE DATE / TIME: 202011040929
ISSUE DATE / TIME: 202011040929
Unit Type and Rh: 6200
Unit Type and Rh: 6200
Unit Type and Rh: 6200
Unit Type and Rh: 6200

## 2019-01-11 ENCOUNTER — Other Ambulatory Visit: Payer: Self-pay | Admitting: Cardiothoracic Surgery

## 2019-01-11 ENCOUNTER — Other Ambulatory Visit: Payer: Self-pay

## 2019-01-11 ENCOUNTER — Ambulatory Visit (INDEPENDENT_AMBULATORY_CARE_PROVIDER_SITE_OTHER): Payer: Medicaid Other | Admitting: Family Medicine

## 2019-01-11 ENCOUNTER — Encounter: Payer: Self-pay | Admitting: Family Medicine

## 2019-01-11 VITALS — BP 134/84 | HR 79 | Temp 98.4°F

## 2019-01-11 DIAGNOSIS — F17210 Nicotine dependence, cigarettes, uncomplicated: Secondary | ICD-10-CM | POA: Diagnosis not present

## 2019-01-11 DIAGNOSIS — Z951 Presence of aortocoronary bypass graft: Secondary | ICD-10-CM | POA: Diagnosis not present

## 2019-01-11 DIAGNOSIS — I10 Essential (primary) hypertension: Secondary | ICD-10-CM

## 2019-01-11 DIAGNOSIS — N184 Chronic kidney disease, stage 4 (severe): Secondary | ICD-10-CM | POA: Diagnosis not present

## 2019-01-11 LAB — BASIC METABOLIC PANEL
BUN/Creatinine Ratio: 21 (calc) (ref 6–22)
BUN: 99 mg/dL — ABNORMAL HIGH (ref 7–25)
CO2: 25 mmol/L (ref 20–32)
Calcium: 7.6 mg/dL — ABNORMAL LOW (ref 8.6–10.3)
Chloride: 106 mmol/L (ref 98–110)
Creat: 4.62 mg/dL — ABNORMAL HIGH (ref 0.70–1.25)
Glucose, Bld: 107 mg/dL — ABNORMAL HIGH (ref 65–99)
Potassium: 5.2 mmol/L (ref 3.5–5.3)
Sodium: 143 mmol/L (ref 135–146)

## 2019-01-11 NOTE — Progress Notes (Signed)
BP 134/84   Pulse 79   Temp 98.4 F (36.9 C)   SpO2 99%    Subjective:    Patient ID: Anthony Hess, male    DOB: 03/10/1955, 63 y.o.   MRN: [id number]  HPI: Anthony Hess is a 63 y.o. male  Chief Complaint  Patient presents with  . Hospitalization Follow-up   Patient here today for hospital follow up for CAD s/p CABG x 4 that was performed on 12/27/2018. Post operatively developed acute renal failure with highest creatinine of 6.08. Nephrology was consulted and diuretics modified with eventual return to baseline. He also became confused and mildly combative transiently. Also had some complications with bradycardia which initially required pacing which resolved as well. His amlodipine and imdur were held at discharge, and he was discharged home with metoprolol, lasix, and amiodarone which he seems to be tolerating well without known hypotensive episodes (does not check regularly at home). Also started on aspirin and plavix without issue so far. Chest pain has improved, surgical incision still painful but he states he's tolerating the pain with just tylenol. Denies fever, chills, dizziness, syncope.   Relevant past medical, surgical, family and social history reviewed and updated as indicated. Interim medical history since our last visit reviewed. Allergies and medications reviewed and updated.  Review of Systems  Per HPI unless specifically indicated above     Objective:    BP 134/84   Pulse 79   Temp 98.4 F (36.9 C)   SpO2 99%   Wt Readings from Last 3 Encounters:  01/12/19 132 lb (59.9 kg)  01/05/19 119 lb 14.9 oz (54.4 kg)  12/25/18 136 lb 1.6 oz (61.7 kg)    Physical Exam Vitals signs and nursing note reviewed.  Constitutional:      Appearance: Normal appearance.  HENT:     Head: Atraumatic.  Eyes:     Extraocular Movements: Extraocular movements intact.     Conjunctiva/sclera: Conjunctivae normal.  Neck:     Musculoskeletal: Normal range of motion and neck  supple.  Cardiovascular:     Rate and Rhythm: Normal rate and regular rhythm.  Pulmonary:     Effort: Pulmonary effort is normal.     Breath sounds: Normal breath sounds.  Abdominal:     General: Bowel sounds are normal.     Palpations: Abdomen is soft.     Tenderness: There is no abdominal tenderness. There is no guarding.  Musculoskeletal: Normal range of motion.  Skin:    General: Skin is warm.     Comments: Surgical site from recent CABG healing fairly well, but some edema and ttp particularly toward center of incision vertically. No active drainage, bleeding, or cellulitis noted at this time  Neurological:     General: No focal deficit present.     Mental Status: He is oriented to person, place, and time.  Psychiatric:        Mood and Affect: Mood normal.        Thought Content: Thought content normal.        Judgment: Judgment normal.     Results for orders placed or performed during the hospital encounter of 12/27/18  Hemoglobin and hematocrit, blood  Result Value Ref Range   Hemoglobin 7.3 (L) 13.0 - 17.0 g/dL   HCT 23.3 (L) 39.0 - 52.0 %  Platelet count  Result Value Ref Range   Platelets 203 150 - 400 K/uL  CBC  Result Value Ref Range   WBC 13.6 (H) 4.0 -  10.5 K/uL   RBC 2.93 (L) 4.22 - 5.81 MIL/uL   Hemoglobin 8.2 (L) 13.0 - 17.0 g/dL   HCT 27.2 (L) 39.0 - 52.0 %   MCV 92.8 80.0 - 100.0 fL   MCH 28.0 26.0 - 34.0 pg   MCHC 30.1 30.0 - 36.0 g/dL   RDW 15.8 (H) 11.5 - 15.5 %   Platelets 236 150 - 400 K/uL   nRBC 0.0 0.0 - 0.2 %  Protime-INR  Result Value Ref Range   Prothrombin Time 15.7 (H) 11.4 - 15.2 seconds   INR 1.3 (H) 0.8 - 1.2  APTT  Result Value Ref Range   aPTT 29 24 - 36 seconds  Glucose, capillary  Result Value Ref Range   Glucose-Capillary 50 (L) 70 - 99 mg/dL   Comment 1 Document in Chart   Glucose, capillary  Result Value Ref Range   Glucose-Capillary 78 70 - 99 mg/dL  CBC  Result Value Ref Range   WBC 13.0 (H) 4.0 - 10.5 K/uL   RBC  2.95 (L) 4.22 - 5.81 MIL/uL   Hemoglobin 8.3 (L) 13.0 - 17.0 g/dL   HCT 26.9 (L) 39.0 - 52.0 %   MCV 91.2 80.0 - 100.0 fL   MCH 28.1 26.0 - 34.0 pg   MCHC 30.9 30.0 - 36.0 g/dL   RDW 15.9 (H) 11.5 - 15.5 %   Platelets 203 150 - 400 K/uL   nRBC 0.0 0.0 - 0.2 %  Basic metabolic panel  Result Value Ref Range   Sodium 143 135 - 145 mmol/L   Potassium 6.0 (H) 3.5 - 5.1 mmol/L   Chloride 113 (H) 98 - 111 mmol/L   CO2 19 (L) 22 - 32 mmol/L   Glucose, Bld 111 (H) 70 - 99 mg/dL   BUN 45 (H) 6 - 20 mg/dL   Creatinine, Ser 2.95 (H) 0.61 - 1.24 mg/dL   Calcium 7.1 (L) 8.9 - 10.3 mg/dL   GFR calc non Af Amer 22 (L) >60 mL/min   GFR calc Af Amer 26 (L) >60 mL/min   Anion gap 11 5 - 15  Magnesium  Result Value Ref Range   Magnesium 2.0 1.7 - 2.4 mg/dL  Glucose, capillary  Result Value Ref Range   Glucose-Capillary 97 70 - 99 mg/dL  Glucose, capillary  Result Value Ref Range   Glucose-Capillary 107 (H) 70 - 99 mg/dL  Glucose, capillary  Result Value Ref Range   Glucose-Capillary 130 (H) 70 - 99 mg/dL  Glucose, capillary  Result Value Ref Range   Glucose-Capillary 143 (H) 70 - 99 mg/dL  Glucose, capillary  Result Value Ref Range   Glucose-Capillary 163 (H) 70 - 99 mg/dL  Basic metabolic panel  Result Value Ref Range   Sodium 144 135 - 145 mmol/L   Potassium 6.1 (H) 3.5 - 5.1 mmol/L   Chloride 112 (H) 98 - 111 mmol/L   CO2 21 (L) 22 - 32 mmol/L   Glucose, Bld 128 (H) 70 - 99 mg/dL   BUN 47 (H) 6 - 20 mg/dL   Creatinine, Ser 3.76 (H) 0.61 - 1.24 mg/dL   Calcium 7.3 (L) 8.9 - 10.3 mg/dL   GFR calc non Af Amer 16 (L) >60 mL/min   GFR calc Af Amer 19 (L) >60 mL/min   Anion gap 11 5 - 15  Magnesium  Result Value Ref Range   Magnesium 1.9 1.7 - 2.4 mg/dL  CBC  Result Value Ref Range   WBC 17.5 (H)  4.0 - 10.5 K/uL   RBC 3.06 (L) 4.22 - 5.81 MIL/uL   Hemoglobin 8.6 (L) 13.0 - 17.0 g/dL   HCT 28.8 (L) 39.0 - 52.0 %   MCV 94.1 80.0 - 100.0 fL   MCH 28.1 26.0 - 34.0 pg   MCHC 29.9  (L) 30.0 - 36.0 g/dL   RDW 16.1 (H) 11.5 - 15.5 %   Platelets 207 150 - 400 K/uL   nRBC 0.0 0.0 - 0.2 %  Glucose, capillary  Result Value Ref Range   Glucose-Capillary 139 (H) 70 - 99 mg/dL  Glucose, capillary  Result Value Ref Range   Glucose-Capillary 115 (H) 70 - 99 mg/dL  Glucose, capillary  Result Value Ref Range   Glucose-Capillary 116 (H) 70 - 99 mg/dL  Glucose, capillary  Result Value Ref Range   Glucose-Capillary 97 70 - 99 mg/dL  Glucose, capillary  Result Value Ref Range   Glucose-Capillary 134 (H) 70 - 99 mg/dL  Glucose, capillary  Result Value Ref Range   Glucose-Capillary 149 (H) 70 - 99 mg/dL  Glucose, capillary  Result Value Ref Range   Glucose-Capillary 121 (H) 70 - 99 mg/dL  Glucose, capillary  Result Value Ref Range   Glucose-Capillary 115 (H) 70 - 99 mg/dL  Glucose, capillary  Result Value Ref Range   Glucose-Capillary 121 (H) 70 - 99 mg/dL  Glucose, capillary  Result Value Ref Range   Glucose-Capillary 120 (H) 70 - 99 mg/dL  Glucose, capillary  Result Value Ref Range   Glucose-Capillary 114 (H) 70 - 99 mg/dL  Glucose, capillary  Result Value Ref Range   Glucose-Capillary 104 (H) 70 - 99 mg/dL  Glucose, capillary  Result Value Ref Range   Glucose-Capillary 73 70 - 99 mg/dL  Glucose, capillary  Result Value Ref Range   Glucose-Capillary 132 (H) 70 - 99 mg/dL  Glucose, capillary  Result Value Ref Range   Glucose-Capillary 121 (H) 70 - 99 mg/dL  CBC with Differential/Platelet  Result Value Ref Range   WBC 17.9 (H) 4.0 - 10.5 K/uL   RBC 3.02 (L) 4.22 - 5.81 MIL/uL   Hemoglobin 8.4 (L) 13.0 - 17.0 g/dL   HCT 28.7 (L) 39.0 - 52.0 %   MCV 95.0 80.0 - 100.0 fL   MCH 27.8 26.0 - 34.0 pg   MCHC 29.3 (L) 30.0 - 36.0 g/dL   RDW 16.3 (H) 11.5 - 15.5 %   Platelets 190 150 - 400 K/uL   nRBC 0.0 0.0 - 0.2 %   Neutrophils Relative % 85 %   Neutro Abs 15.1 (H) 1.7 - 7.7 K/uL   Lymphocytes Relative 6 %   Lymphs Abs 1.1 0.7 - 4.0 K/uL   Monocytes  Relative 9 %   Monocytes Absolute 1.6 (H) 0.1 - 1.0 K/uL   Eosinophils Relative 0 %   Eosinophils Absolute 0.1 0.0 - 0.5 K/uL   Basophils Relative 0 %   Basophils Absolute 0.0 0.0 - 0.1 K/uL   Immature Granulocytes 0 %   Abs Immature Granulocytes 0.08 (H) 0.00 - 0.07 K/uL  Basic metabolic panel  Result Value Ref Range   Sodium 139 135 - 145 mmol/L   Potassium 6.3 (HH) 3.5 - 5.1 mmol/L   Chloride 109 98 - 111 mmol/L   CO2 20 (L) 22 - 32 mmol/L   Glucose, Bld 125 (H) 70 - 99 mg/dL   BUN 50 (H) 6 - 20 mg/dL   Creatinine, Ser 4.18 (H) 0.61 - 1.24 mg/dL  Calcium 7.0 (L) 8.9 - 10.3 mg/dL   GFR calc non Af Amer 14 (L) >60 mL/min   GFR calc Af Amer 17 (L) >60 mL/min   Anion gap 10 5 - 15  Glucose, capillary  Result Value Ref Range   Glucose-Capillary 49 (L) 70 - 99 mg/dL  Glucose, capillary  Result Value Ref Range   Glucose-Capillary 65 (L) 70 - 99 mg/dL  Glucose, capillary  Result Value Ref Range   Glucose-Capillary 86 70 - 99 mg/dL  CBC with Differential/Platelet  Result Value Ref Range   WBC 11.2 (H) 4.0 - 10.5 K/uL   RBC 2.87 (L) 4.22 - 5.81 MIL/uL   Hemoglobin 7.9 (L) 13.0 - 17.0 g/dL   HCT 27.1 (L) 39.0 - 52.0 %   MCV 94.4 80.0 - 100.0 fL   MCH 27.5 26.0 - 34.0 pg   MCHC 29.2 (L) 30.0 - 36.0 g/dL   RDW 16.1 (H) 11.5 - 15.5 %   Platelets 183 150 - 400 K/uL   nRBC 0.0 0.0 - 0.2 %   Neutrophils Relative % 86 %   Neutro Abs 9.7 (H) 1.7 - 7.7 K/uL   Lymphocytes Relative 5 %   Lymphs Abs 0.5 (L) 0.7 - 4.0 K/uL   Monocytes Relative 8 %   Monocytes Absolute 0.8 0.1 - 1.0 K/uL   Eosinophils Relative 0 %   Eosinophils Absolute 0.0 0.0 - 0.5 K/uL   Basophils Relative 0 %   Basophils Absolute 0.0 0.0 - 0.1 K/uL   Immature Granulocytes 1 %   Abs Immature Granulocytes 0.06 0.00 - 0.07 K/uL  Basic metabolic panel  Result Value Ref Range   Sodium 140 135 - 145 mmol/L   Potassium 6.1 (H) 3.5 - 5.1 mmol/L   Chloride 108 98 - 111 mmol/L   CO2 21 (L) 22 - 32 mmol/L   Glucose,  Bld 114 (H) 70 - 99 mg/dL   BUN 65 (H) 6 - 20 mg/dL   Creatinine, Ser 5.27 (H) 0.61 - 1.24 mg/dL   Calcium 6.7 (L) 8.9 - 10.3 mg/dL   GFR calc non Af Amer 11 (L) >60 mL/min   GFR calc Af Amer 13 (L) >60 mL/min   Anion gap 11 5 - 15  Glucose, capillary  Result Value Ref Range   Glucose-Capillary 109 (H) 70 - 99 mg/dL  Glucose, capillary  Result Value Ref Range   Glucose-Capillary >600 (HH) 70 - 99 mg/dL  Glucose, capillary  Result Value Ref Range   Glucose-Capillary 104 (H) 70 - 99 mg/dL  Glucose, capillary  Result Value Ref Range   Glucose-Capillary 104 (H) 70 - 99 mg/dL  Glucose, capillary  Result Value Ref Range   Glucose-Capillary 130 (H) 70 - 99 mg/dL  Sodium, urine, random  Result Value Ref Range   Sodium, Ur 27 mmol/L  Creatinine, urine, random  Result Value Ref Range   Creatinine, Urine 70.41 mg/dL  CBC with Differential/Platelet  Result Value Ref Range   WBC 9.5 4.0 - 10.5 K/uL   RBC 3.07 (L) 4.22 - 5.81 MIL/uL   Hemoglobin 8.6 (L) 13.0 - 17.0 g/dL   HCT 28.1 (L) 39.0 - 52.0 %   MCV 91.5 80.0 - 100.0 fL   MCH 28.0 26.0 - 34.0 pg   MCHC 30.6 30.0 - 36.0 g/dL   RDW 15.9 (H) 11.5 - 15.5 %   Platelets 188 150 - 400 K/uL   nRBC 0.0 0.0 - 0.2 %   Neutrophils Relative % 82 %  Neutro Abs 7.8 (H) 1.7 - 7.7 K/uL   Lymphocytes Relative 6 %   Lymphs Abs 0.6 (L) 0.7 - 4.0 K/uL   Monocytes Relative 10 %   Monocytes Absolute 1.0 0.1 - 1.0 K/uL   Eosinophils Relative 1 %   Eosinophils Absolute 0.1 0.0 - 0.5 K/uL   Basophils Relative 0 %   Basophils Absolute 0.0 0.0 - 0.1 K/uL   Immature Granulocytes 1 %   Abs Immature Granulocytes 0.07 0.00 - 0.07 K/uL  Comprehensive metabolic panel  Result Value Ref Range   Sodium 142 135 - 145 mmol/L   Potassium 5.2 (H) 3.5 - 5.1 mmol/L   Chloride 106 98 - 111 mmol/L   CO2 19 (L) 22 - 32 mmol/L   Glucose, Bld 91 70 - 99 mg/dL   BUN 87 (H) 6 - 20 mg/dL   Creatinine, Ser 5.89 (H) 0.61 - 1.24 mg/dL   Calcium 6.2 (LL) 8.9 - 10.3  mg/dL   Total Protein 5.6 (L) 6.5 - 8.1 g/dL   Albumin 2.3 (L) 3.5 - 5.0 g/dL   AST 30 15 - 41 U/L   ALT 24 0 - 44 U/L   Alkaline Phosphatase 68 38 - 126 U/L   Total Bilirubin 0.6 0.3 - 1.2 mg/dL   GFR calc non Af Amer 10 (L) >60 mL/min   GFR calc Af Amer 11 (L) >60 mL/min   Anion gap 17 (H) 5 - 15  Vancomycin, random  Result Value Ref Range   Vancomycin Rm 18   Glucose, capillary  Result Value Ref Range   Glucose-Capillary 126 (H) 70 - 99 mg/dL  CBC with Differential/Platelet  Result Value Ref Range   WBC 7.7 4.0 - 10.5 K/uL   RBC 3.02 (L) 4.22 - 5.81 MIL/uL   Hemoglobin 8.3 (L) 13.0 - 17.0 g/dL   HCT 26.4 (L) 39.0 - 52.0 %   MCV 87.4 80.0 - 100.0 fL   MCH 27.5 26.0 - 34.0 pg   MCHC 31.4 30.0 - 36.0 g/dL   RDW 15.6 (H) 11.5 - 15.5 %   Platelets 223 150 - 400 K/uL   nRBC 0.0 0.0 - 0.2 %   Neutrophils Relative % 77 %   Neutro Abs 5.9 1.7 - 7.7 K/uL   Lymphocytes Relative 10 %   Lymphs Abs 0.8 0.7 - 4.0 K/uL   Monocytes Relative 11 %   Monocytes Absolute 0.8 0.1 - 1.0 K/uL   Eosinophils Relative 2 %   Eosinophils Absolute 0.2 0.0 - 0.5 K/uL   Basophils Relative 0 %   Basophils Absolute 0.0 0.0 - 0.1 K/uL   Immature Granulocytes 0 %   Abs Immature Granulocytes 0.03 0.00 - 0.07 K/uL  Basic metabolic panel  Result Value Ref Range   Sodium 142 135 - 145 mmol/L   Potassium 4.1 3.5 - 5.1 mmol/L   Chloride 105 98 - 111 mmol/L   CO2 22 22 - 32 mmol/L   Glucose, Bld 101 (H) 70 - 99 mg/dL   BUN 91 (H) 6 - 20 mg/dL   Creatinine, Ser 6.08 (H) 0.61 - 1.24 mg/dL   Calcium 6.0 (LL) 8.9 - 10.3 mg/dL   GFR calc non Af Amer 9 (L) >60 mL/min   GFR calc Af Amer 11 (L) >60 mL/min   Anion gap 15 5 - 15  CBC with Differential/Platelet  Result Value Ref Range   WBC 6.8 4.0 - 10.5 K/uL   RBC 2.95 (L) 4.22 - 5.81 MIL/uL  Hemoglobin 8.1 (L) 13.0 - 17.0 g/dL   HCT 25.7 (L) 39.0 - 52.0 %   MCV 87.1 80.0 - 100.0 fL   MCH 27.5 26.0 - 34.0 pg   MCHC 31.5 30.0 - 36.0 g/dL   RDW 15.1 11.5  - 15.5 %   Platelets 231 150 - 400 K/uL   nRBC 0.0 0.0 - 0.2 %   Neutrophils Relative % 72 %   Neutro Abs 4.9 1.7 - 7.7 K/uL   Lymphocytes Relative 10 %   Lymphs Abs 0.7 0.7 - 4.0 K/uL   Monocytes Relative 15 %   Monocytes Absolute 1.0 0.1 - 1.0 K/uL   Eosinophils Relative 3 %   Eosinophils Absolute 0.2 0.0 - 0.5 K/uL   Basophils Relative 0 %   Basophils Absolute 0.0 0.0 - 0.1 K/uL   Immature Granulocytes 0 %   Abs Immature Granulocytes 0.03 0.00 - 0.07 K/uL  Basic metabolic panel  Result Value Ref Range   Sodium 142 135 - 145 mmol/L   Potassium 3.2 (L) 3.5 - 5.1 mmol/L   Chloride 105 98 - 111 mmol/L   CO2 23 22 - 32 mmol/L   Glucose, Bld 116 (H) 70 - 99 mg/dL   BUN 96 (H) 6 - 20 mg/dL   Creatinine, Ser 5.53 (H) 0.61 - 1.24 mg/dL   Calcium 6.2 (LL) 8.9 - 10.3 mg/dL   GFR calc non Af Amer 10 (L) >60 mL/min   GFR calc Af Amer 12 (L) >60 mL/min   Anion gap 14 5 - 15  CBC with Differential/Platelet  Result Value Ref Range   WBC 8.3 4.0 - 10.5 K/uL   RBC 3.24 (L) 4.22 - 5.81 MIL/uL   Hemoglobin 8.8 (L) 13.0 - 17.0 g/dL   HCT 27.9 (L) 39.0 - 52.0 %   MCV 86.1 80.0 - 100.0 fL   MCH 27.2 26.0 - 34.0 pg   MCHC 31.5 30.0 - 36.0 g/dL   RDW 14.6 11.5 - 15.5 %   Platelets 258 150 - 400 K/uL   nRBC 0.0 0.0 - 0.2 %   Neutrophils Relative % 70 %   Neutro Abs 5.7 1.7 - 7.7 K/uL   Lymphocytes Relative 9 %   Lymphs Abs 0.8 0.7 - 4.0 K/uL   Monocytes Relative 16 %   Monocytes Absolute 1.3 (H) 0.1 - 1.0 K/uL   Eosinophils Relative 5 %   Eosinophils Absolute 0.4 0.0 - 0.5 K/uL   Basophils Relative 0 %   Basophils Absolute 0.0 0.0 - 0.1 K/uL   Immature Granulocytes 0 %   Abs Immature Granulocytes 0.03 0.00 - 0.07 K/uL  Comprehensive metabolic panel  Result Value Ref Range   Sodium 142 135 - 145 mmol/L   Potassium 3.4 (L) 3.5 - 5.1 mmol/L   Chloride 100 98 - 111 mmol/L   CO2 26 22 - 32 mmol/L   Glucose, Bld 138 (H) 70 - 99 mg/dL   BUN 96 (H) 6 - 20 mg/dL   Creatinine, Ser 5.15 (H)  0.61 - 1.24 mg/dL   Calcium 6.7 (L) 8.9 - 10.3 mg/dL   Total Protein 6.0 (L) 6.5 - 8.1 g/dL   Albumin 2.5 (L) 3.5 - 5.0 g/dL   AST 27 15 - 41 U/L   ALT 25 0 - 44 U/L   Alkaline Phosphatase 65 38 - 126 U/L   Total Bilirubin 0.5 0.3 - 1.2 mg/dL   GFR calc non Af Amer 11 (L) >60 mL/min   GFR calc  Af Amer 13 (L) >60 mL/min   Anion gap 16 (H) 5 - 15  Basic metabolic panel  Result Value Ref Range   Sodium 141 135 - 145 mmol/L   Potassium 3.4 (L) 3.5 - 5.1 mmol/L   Chloride 101 98 - 111 mmol/L   CO2 25 22 - 32 mmol/L   Glucose, Bld 131 (H) 70 - 99 mg/dL   BUN 95 (H) 6 - 20 mg/dL   Creatinine, Ser 5.01 (H) 0.61 - 1.24 mg/dL   Calcium 6.9 (L) 8.9 - 10.3 mg/dL   GFR calc non Af Amer 12 (L) >60 mL/min   GFR calc Af Amer 13 (L) >60 mL/min   Anion gap 15 5 - 15  CBC with Differential/Platelet  Result Value Ref Range   WBC 9.5 4.0 - 10.5 K/uL   RBC 3.23 (L) 4.22 - 5.81 MIL/uL   Hemoglobin 9.0 (L) 13.0 - 17.0 g/dL   HCT 27.5 (L) 39.0 - 52.0 %   MCV 85.1 80.0 - 100.0 fL   MCH 27.9 26.0 - 34.0 pg   MCHC 32.7 30.0 - 36.0 g/dL   RDW 14.6 11.5 - 15.5 %   Platelets 278 150 - 400 K/uL   nRBC 0.0 0.0 - 0.2 %   Neutrophils Relative % 69 %   Neutro Abs 6.7 1.7 - 7.7 K/uL   Lymphocytes Relative 10 %   Lymphs Abs 0.9 0.7 - 4.0 K/uL   Monocytes Relative 14 %   Monocytes Absolute 1.3 (H) 0.1 - 1.0 K/uL   Eosinophils Relative 6 %   Eosinophils Absolute 0.5 0.0 - 0.5 K/uL   Basophils Relative 0 %   Basophils Absolute 0.0 0.0 - 0.1 K/uL   Immature Granulocytes 1 %   Abs Immature Granulocytes 0.05 0.00 - 0.07 K/uL  I-STAT, chem 8  Result Value Ref Range   Sodium 145 135 - 145 mmol/L   Potassium 5.2 (H) 3.5 - 5.1 mmol/L   Chloride 116 (H) 98 - 111 mmol/L   BUN 44 (H) 6 - 20 mg/dL   Creatinine, Ser 2.80 (H) 0.61 - 1.24 mg/dL   Glucose, Bld 89 70 - 99 mg/dL   Calcium, Ion 1.14 (L) 1.15 - 1.40 mmol/L   TCO2 19 (L) 22 - 32 mmol/L   Hemoglobin 10.9 (L) 13.0 - 17.0 g/dL   HCT 32.0 (L) 39.0 - 52.0  %  I-STAT, chem 8  Result Value Ref Range   Sodium 143 135 - 145 mmol/L   Potassium 6.6 (HH) 3.5 - 5.1 mmol/L   Chloride 115 (H) 98 - 111 mmol/L   BUN 46 (H) 6 - 20 mg/dL   Creatinine, Ser 2.90 (H) 0.61 - 1.24 mg/dL   Glucose, Bld 119 (H) 70 - 99 mg/dL   Calcium, Ion 1.12 (L) 1.15 - 1.40 mmol/L   TCO2 19 (L) 22 - 32 mmol/L   Hemoglobin 11.2 (L) 13.0 - 17.0 g/dL   HCT 33.0 (L) 39.0 - 52.0 %  I-STAT 7, (LYTES, BLD GAS, ICA, H+H)  Result Value Ref Range   pH, Arterial 7.181 (LL) 7.350 - 7.450   pCO2 arterial 59.1 (H) 32.0 - 48.0 mmHg   pO2, Arterial 406.0 (H) 83.0 - 108.0 mmHg   Bicarbonate 22.1 20.0 - 28.0 mmol/L   TCO2 24 22 - 32 mmol/L   O2 Saturation 100.0 %   Acid-base deficit 6.0 (H) 0.0 - 2.0 mmol/L   Sodium 142 135 - 145 mmol/L   Potassium 6.5 (HH) 3.5 -  5.1 mmol/L   Calcium, Ion 0.91 (L) 1.15 - 1.40 mmol/L   HCT 26.0 (L) 39.0 - 52.0 %   Hemoglobin 8.8 (L) 13.0 - 17.0 g/dL   Patient temperature HIDE    Sample type CARDIOPULMONARY BYPASS   I-STAT, chem 8  Result Value Ref Range   Sodium 143 135 - 145 mmol/L   Potassium 6.6 (HH) 3.5 - 5.1 mmol/L   Chloride 112 (H) 98 - 111 mmol/L   BUN 40 (H) 6 - 20 mg/dL   Creatinine, Ser 2.40 (H) 0.61 - 1.24 mg/dL   Glucose, Bld 110 (H) 70 - 99 mg/dL   Calcium, Ion 0.91 (L) 1.15 - 1.40 mmol/L   TCO2 22 22 - 32 mmol/L   Hemoglobin 9.9 (L) 13.0 - 17.0 g/dL   HCT 29.0 (L) 39.0 - 52.0 %  I-STAT, chem 8  Result Value Ref Range   Sodium 141 135 - 145 mmol/L   Potassium 7.1 (HH) 3.5 - 5.1 mmol/L   Chloride 112 (H) 98 - 111 mmol/L   BUN 43 (H) 6 - 20 mg/dL   Creatinine, Ser 2.70 (H) 0.61 - 1.24 mg/dL   Glucose, Bld 162 (H) 70 - 99 mg/dL   Calcium, Ion 0.92 (L) 1.15 - 1.40 mmol/L   TCO2 23 22 - 32 mmol/L   Hemoglobin 8.5 (L) 13.0 - 17.0 g/dL   HCT 25.0 (L) 39.0 - 52.0 %  I-STAT, chem 8  Result Value Ref Range   Sodium 139 135 - 145 mmol/L   Potassium 7.2 (HH) 3.5 - 5.1 mmol/L   Chloride 110 98 - 111 mmol/L   BUN 45 (H) 6 - 20  mg/dL   Creatinine, Ser 2.80 (H) 0.61 - 1.24 mg/dL   Glucose, Bld 153 (H) 70 - 99 mg/dL   Calcium, Ion 0.93 (L) 1.15 - 1.40 mmol/L   TCO2 21 (L) 22 - 32 mmol/L   Hemoglobin 8.2 (L) 13.0 - 17.0 g/dL   HCT 24.0 (L) 39.0 - 52.0 %  I-STAT, chem 8  Result Value Ref Range   Sodium 142 135 - 145 mmol/L   Potassium 7.2 (HH) 3.5 - 5.1 mmol/L   Chloride 111 98 - 111 mmol/L   BUN 42 (H) 6 - 20 mg/dL   Creatinine, Ser 2.80 (H) 0.61 - 1.24 mg/dL   Glucose, Bld 133 (H) 70 - 99 mg/dL   Calcium, Ion 0.93 (L) 1.15 - 1.40 mmol/L   TCO2 23 22 - 32 mmol/L   Hemoglobin 7.8 (L) 13.0 - 17.0 g/dL   HCT 23.0 (L) 39.0 - 52.0 %  I-STAT, chem 8  Result Value Ref Range   Sodium 144 135 - 145 mmol/L   Potassium 5.8 (H) 3.5 - 5.1 mmol/L   Chloride 111 98 - 111 mmol/L   BUN 45 (H) 6 - 20 mg/dL   Creatinine, Ser 2.50 (H) 0.61 - 1.24 mg/dL   Glucose, Bld 75 70 - 99 mg/dL   Calcium, Ion 1.21 1.15 - 1.40 mmol/L   TCO2 22 22 - 32 mmol/L   Hemoglobin 8.8 (L) 13.0 - 17.0 g/dL   HCT 26.0 (L) 39.0 - 52.0 %  I-STAT 7, (LYTES, BLD GAS, ICA, H+H)  Result Value Ref Range   pH, Arterial 7.178 (LL) 7.350 - 7.450   pCO2 arterial 63.5 (H) 32.0 - 48.0 mmHg   pO2, Arterial 381.0 (H) 83.0 - 108.0 mmHg   Bicarbonate 23.6 20.0 - 28.0 mmol/L   TCO2 25 22 - 32 mmol/L  O2 Saturation 100.0 %   Acid-base deficit 5.0 (H) 0.0 - 2.0 mmol/L   Sodium 143 135 - 145 mmol/L   Potassium 5.7 (H) 3.5 - 5.1 mmol/L   Calcium, Ion 1.17 1.15 - 1.40 mmol/L   HCT 24.0 (L) 39.0 - 52.0 %   Hemoglobin 8.2 (L) 13.0 - 17.0 g/dL   Patient temperature HIDE    Sample type CARDIOPULMONARY BYPASS   I-STAT 7, (LYTES, BLD GAS, ICA, H+H)  Result Value Ref Range   pH, Arterial 7.215 (L) 7.350 - 7.450   pCO2 arterial 52.3 (H) 32.0 - 48.0 mmHg   pO2, Arterial 110.0 (H) 83.0 - 108.0 mmHg   Bicarbonate 21.4 20.0 - 28.0 mmol/L   TCO2 23 22 - 32 mmol/L   O2 Saturation 97.0 %   Acid-base deficit 6.0 (H) 0.0 - 2.0 mmol/L   Sodium 144 135 - 145 mmol/L    Potassium 5.5 (H) 3.5 - 5.1 mmol/L   Calcium, Ion 1.09 (L) 1.15 - 1.40 mmol/L   HCT 26.0 (L) 39.0 - 52.0 %   Hemoglobin 8.8 (L) 13.0 - 17.0 g/dL   Patient temperature 36.1 C    Collection site ARTERIAL LINE    Drawn by Operator    Sample type CARDIOPULMONARY BYPASS   I-STAT 7, (LYTES, BLD GAS, ICA, H+H)  Result Value Ref Range   pH, Arterial 7.270 (L) 7.350 - 7.450   pCO2 arterial 51.3 (H) 32.0 - 48.0 mmHg   pO2, Arterial 88.0 83.0 - 108.0 mmHg   Bicarbonate 23.7 20.0 - 28.0 mmol/L   TCO2 25 22 - 32 mmol/L   O2 Saturation 95.0 %   Acid-base deficit 3.0 (H) 0.0 - 2.0 mmol/L   Sodium 145 135 - 145 mmol/L   Potassium 5.6 (H) 3.5 - 5.1 mmol/L   Calcium, Ion 1.03 (L) 1.15 - 1.40 mmol/L   HCT 27.0 (L) 39.0 - 52.0 %   Hemoglobin 9.2 (L) 13.0 - 17.0 g/dL   Patient temperature 36.6 C    Collection site ARTERIAL LINE    Drawn by Operator    Sample type CARDIOPULMONARY BYPASS   I-STAT 7, (LYTES, BLD GAS, ICA, H+H)  Result Value Ref Range   pH, Arterial 7.293 (L) 7.350 - 7.450   pCO2 arterial 48.4 (H) 32.0 - 48.0 mmHg   pO2, Arterial 118.0 (H) 83.0 - 108.0 mmHg   Bicarbonate 23.5 20.0 - 28.0 mmol/L   TCO2 25 22 - 32 mmol/L   O2 Saturation 98.0 %   Acid-base deficit 3.0 (H) 0.0 - 2.0 mmol/L   Sodium 144 135 - 145 mmol/L   Potassium 6.1 (H) 3.5 - 5.1 mmol/L   Calcium, Ion 0.99 (L) 1.15 - 1.40 mmol/L   HCT 24.0 (L) 39.0 - 52.0 %   Hemoglobin 8.2 (L) 13.0 - 17.0 g/dL   Patient temperature 36.9 C    Collection site ARTERIAL LINE    Drawn by Operator    Sample type ARTERIAL   I-STAT 7, (LYTES, BLD GAS, ICA, H+H)  Result Value Ref Range   pH, Arterial 7.325 (L) 7.350 - 7.450   pCO2 arterial 39.4 32.0 - 48.0 mmHg   pO2, Arterial 107.0 83.0 - 108.0 mmHg   Bicarbonate 20.6 20.0 - 28.0 mmol/L   TCO2 22 22 - 32 mmol/L   O2 Saturation 98.0 %   Acid-base deficit 5.0 (H) 0.0 - 2.0 mmol/L   Sodium 143 135 - 145 mmol/L   Potassium 6.2 (H) 3.5 - 5.1 mmol/L   Calcium,  Ion 1.01 (L) 1.15 - 1.40  mmol/L   HCT 25.0 (L) 39.0 - 52.0 %   Hemoglobin 8.5 (L) 13.0 - 17.0 g/dL   Patient temperature 37.1 C    Sample type ARTERIAL   I-STAT 7, (LYTES, BLD GAS, ICA, H+H)  Result Value Ref Range   pH, Arterial 7.356 7.350 - 7.450   pCO2 arterial 38.9 32.0 - 48.0 mmHg   pO2, Arterial 131.0 (H) 83.0 - 108.0 mmHg   Bicarbonate 21.8 20.0 - 28.0 mmol/L   TCO2 23 22 - 32 mmol/L   O2 Saturation 99.0 %   Acid-base deficit 3.0 (H) 0.0 - 2.0 mmol/L   Sodium 143 135 - 145 mmol/L   Potassium 6.0 (H) 3.5 - 5.1 mmol/L   Calcium, Ion 1.01 (L) 1.15 - 1.40 mmol/L   HCT 23.0 (L) 39.0 - 52.0 %   Hemoglobin 7.8 (L) 13.0 - 17.0 g/dL   Patient temperature 36.9 C    Sample type ARTERIAL   I-STAT 7, (LYTES, BLD GAS, ICA, H+H)  Result Value Ref Range   pH, Arterial 7.313 (L) 7.350 - 7.450   pCO2 arterial 40.2 32.0 - 48.0 mmHg   pO2, Arterial 86.0 83.0 - 108.0 mmHg   Bicarbonate 20.4 20.0 - 28.0 mmol/L   TCO2 22 22 - 32 mmol/L   O2 Saturation 96.0 %   Acid-base deficit 5.0 (H) 0.0 - 2.0 mmol/L   Sodium 144 135 - 145 mmol/L   Potassium 5.6 (H) 3.5 - 5.1 mmol/L   Calcium, Ion 1.02 (L) 1.15 - 1.40 mmol/L   HCT 26.0 (L) 39.0 - 52.0 %   Hemoglobin 8.8 (L) 13.0 - 17.0 g/dL   Patient temperature 36.9 C    Sample type ARTERIAL   I-STAT 7, (LYTES, BLD GAS, ICA, H+H)  Result Value Ref Range   pH, Arterial 7.253 (L) 7.350 - 7.450   pCO2 arterial 45.3 32.0 - 48.0 mmHg   pO2, Arterial 60.0 (L) 83.0 - 108.0 mmHg   Bicarbonate 20.3 20.0 - 28.0 mmol/L   TCO2 22 22 - 32 mmol/L   O2 Saturation 88.0 %   Acid-base deficit 7.0 (H) 0.0 - 2.0 mmol/L   Sodium 142 135 - 145 mmol/L   Potassium 6.0 (H) 3.5 - 5.1 mmol/L   Calcium, Ion 1.05 (L) 1.15 - 1.40 mmol/L   HCT 25.0 (L) 39.0 - 52.0 %   Hemoglobin 8.5 (L) 13.0 - 17.0 g/dL   Patient temperature 35.9 C    Sample type ARTERIAL   ECHO INTRAOPERATIVE TEE  Result Value Ref Range   BP 169/114 mmHg  Prepare RBC (crossmatch)  Result Value Ref Range   Order Confirmation       ORDER PROCESSED BY BLOOD BANK Performed at [facility], [place] [address] [place], [place] [address]       Assessment & Plan:   Problem List Items Addressed This Visit      Cardiovascular and Mediastinum   Essential hypertension    Stable, appears to be tolerating regimen well. Continue per Cardiology recommendations        Genitourinary   Chronic kidney disease (CKD), stage IV (severe) ([place])    Has f/u with cardiothoracic surgery tomorrow, will hold f/u labs today as they will be repeated at this time. Creatinine back near baseline per most recent labs        Other   Cigarette smoker    Quit smoking prior to surgery, no longer taking NRT  S/P CABG x 4 - Primary    Has f/u with surgeon coming up. Incision does not appear acutely infected at this time but does have some edematous areas. Overall appears to be healing fairly well. Continue current regimen          Follow up plan: Return in about 3 months (around 04/13/2019) for F/u.

## 2019-01-12 ENCOUNTER — Ambulatory Visit
Admission: RE | Admit: 2019-01-12 | Discharge: 2019-01-12 | Disposition: A | Discharge status: Home / Self Care | Payer: Medicaid Other | Source: Ambulatory Visit | Attending: Cardiothoracic Surgery | Admitting: Cardiothoracic Surgery

## 2019-01-12 ENCOUNTER — Ambulatory Visit (INDEPENDENT_AMBULATORY_CARE_PROVIDER_SITE_OTHER): Payer: Self-pay | Admitting: Cardiothoracic Surgery

## 2019-01-12 VITALS — BP 116/83 | HR 83 | Temp 98.1°F | Resp 16 | Ht 63.0 in | Wt 132.0 lb

## 2019-01-12 DIAGNOSIS — Z951 Presence of aortocoronary bypass graft: Secondary | ICD-10-CM

## 2019-01-12 DIAGNOSIS — J9851 Mediastinitis: Secondary | ICD-10-CM

## 2019-01-12 DIAGNOSIS — I2510 Atherosclerotic heart disease of native coronary artery without angina pectoris: Secondary | ICD-10-CM

## 2019-01-15 ENCOUNTER — Other Ambulatory Visit: Payer: Self-pay | Admitting: Cardiothoracic Surgery

## 2019-01-15 DIAGNOSIS — Z951 Presence of aortocoronary bypass graft: Secondary | ICD-10-CM

## 2019-01-15 NOTE — Progress Notes (Signed)
[address]       [place] [address]             [phone]     CARDIOTHORACIC SURGERY OFFICE NOTE  Referring Provider is Isaias Cowman, MD Primary Cardiologist is No primary care provider on file. PCP is Volney American, PA-C   HPI:  63 yo man s/p "all-arterial" CABG x 4 for diffuse coronary disease and mildly depressed LV function. He remained in the hospital an extra few days due to mild delirium and acute-on-chronic renal insufficiency, never becoming oliguric/anuric and ultimately his renal function recovered to the point of being safe for discharge. He presented today complaining of "fullness" in his chest. Denies angina pain, fever or chills.   Current Outpatient Medications  Medication Sig Dispense Refill  . amiodarone (PACERONE) 200 MG tablet Take 1 tablet (200 mg total) by mouth 2 (two) times daily. 60 tablet 2  . aspirin EC 81 MG EC tablet Take 1 tablet (81 mg total) by mouth daily.    Marland Kitchen atorvastatin (LIPITOR) 40 MG tablet Take 1 tablet (40 mg total) by mouth daily at 6 PM. 30 tablet 2  . clopidogrel (PLAVIX) 75 MG tablet Take 1 tablet (75 mg total) by mouth daily. 30 tablet 3  . furosemide (LASIX) 80 MG tablet Take 1 tablet (80 mg total) by mouth daily. 7 tablet 0  . metoprolol succinate (TOPROL-XL) 25 MG 24 hr tablet Take 1 tablet (25 mg total) by mouth daily. 90 tablet 1   Current Facility-Administered Medications  Medication Dose Route Frequency Provider Last Rate Last Dose  . lidocaine (XYLOCAINE) 2 % jelly 1 application  1 application Urethral Once Stoioff, Ronda Fairly, MD          Physical Exam:   BP 116/83 (BP Location: Right Arm)   Pulse 83   Temp 98.1 F (36.7 C) (Skin) Comment: s  Resp 16   Ht 5\' 3"  (1.6 m)   Wt 59.9 kg   SpO2 99% Comment: RA  BMI 23.38 kg/m   General:  Well-appearing, NAD  Chest:   cta  CV:   rrr  Incisions:  Well-healed with one area of eschar approximately 1.5 cm in diameter in mid-sternum;  no surrounding erythema, fluctuence or fluid expression  Abdomen:  sntnd  Extremities:  No edema  Diagnostic Tests:  CXR with clear lung fields   Impression:  Doing well after CABG with slow recovery of renal function based on chem panel this past week  Plan:  F/u in 2 weeks Repeat chemistry panel at that time Report any wound drainage from the eschar area noted above or fevers Will arrange outpatient, NON-CONTRAST ct scan of the chest given his sensation of "fullness" and the presence of unusual eschar at the incisional level.   I spent in excess of 15 minutes during the conduct of this office consultation and >50% of this time involved direct face-to-face encounter with the patient for counseling and/or coordination of their care.  Level 2                 10 minutes Level 3                 15 minutes Level 4                 25 minutes Level 5                 40 minutes  B.  Murvin Natal, MD 01/15/2019 7:15 AM

## 2019-01-22 ENCOUNTER — Encounter: Payer: Self-pay | Admitting: Cardiothoracic Surgery

## 2019-01-22 ENCOUNTER — Ambulatory Visit (INDEPENDENT_AMBULATORY_CARE_PROVIDER_SITE_OTHER): Payer: Self-pay | Admitting: Cardiothoracic Surgery

## 2019-01-22 ENCOUNTER — Ambulatory Visit
Admission: RE | Admit: 2019-01-22 | Discharge: 2019-01-22 | Disposition: A | Discharge status: Home / Self Care | Payer: Medicaid Other | Source: Ambulatory Visit | Attending: Cardiothoracic Surgery | Admitting: Cardiothoracic Surgery

## 2019-01-22 ENCOUNTER — Other Ambulatory Visit: Payer: Self-pay

## 2019-01-22 VITALS — BP 178/112 | HR 78 | Temp 97.9°F | Resp 20 | Wt 133.2 lb

## 2019-01-22 DIAGNOSIS — Z951 Presence of aortocoronary bypass graft: Secondary | ICD-10-CM

## 2019-01-22 NOTE — Assessment & Plan Note (Signed)
Quit smoking prior to surgery, no longer taking NRT

## 2019-01-22 NOTE — Assessment & Plan Note (Signed)
Has f/u with surgeon coming up. Incision does not appear acutely infected at this time but does have some edematous areas. Overall appears to be healing fairly well. Continue current regimen

## 2019-01-22 NOTE — Assessment & Plan Note (Signed)
Has f/u with cardiothoracic surgery tomorrow, will hold f/u labs today as they will be repeated at this time. Creatinine back near baseline per most recent labs

## 2019-01-22 NOTE — Assessment & Plan Note (Signed)
Stable, appears to be tolerating regimen well. Continue per Cardiology recommendations

## 2019-01-22 NOTE — Progress Notes (Signed)
      [address]       [place] [address]             [phone]     CARDIOTHORACIC SURGERY OFFICE NOTE  Referring Provider is Isaias Cowman, MD Primary Cardiologist is No primary care provider on file. PCP is Volney American, PA-C   HPI:  Presents for 2nd visit after cabg. No complaints of chest pain, shortness of breath or fevers.  Ready to return to work. Has successfully stopped smoking.    Current Outpatient Medications  Medication Sig Dispense Refill  . aspirin EC 81 MG tablet Take 81 mg by mouth daily.    Marland Kitchen atorvastatin (LIPITOR) 40 MG tablet Take 1 tablet (40 mg total) by mouth daily at 6 PM. 30 tablet 2  . clopidogrel (PLAVIX) 75 MG tablet Take 1 tablet (75 mg total) by mouth daily. 30 tablet 3  . metoprolol succinate (TOPROL-XL) 25 MG 24 hr tablet Take 1 tablet (25 mg total) by mouth daily. 90 tablet 1   Current Facility-Administered Medications  Medication Dose Route Frequency Provider Last Rate Last Dose  . 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  F/u as needed  I spent in excess of 15 minutes during the conduct of this office consultation and >50% of this time involved direct face-to-face encounter with the patient for counseling and/or coordination of their care.  Level 2                 10  minutes Level 3                 15 minutes Level 4                 25 minutes Level 5                 40 minutes  B. Murvin Natal, MD 01/22/2019 10:31 AM

## 2019-01-26 ENCOUNTER — Encounter: Payer: Medicaid Other | Admitting: Cardiothoracic Surgery

## 2019-01-29 ENCOUNTER — Telehealth: Payer: Self-pay | Admitting: Family Medicine

## 2019-01-29 MED ORDER — AMLODIPINE BESYLATE 5 MG PO TABS: 5.0000 mg | ORAL_TABLET | Freq: Every day | ORAL | 0 refills | Status: DC

## 2019-01-29 NOTE — Telephone Encounter (Signed)
Pt is calling to ask rachel if he can go back to old BP med. Pt does not know the name . Pt said current BP med is not working. Pt BP is 112/102. Pt is not having any symptoms

## 2019-01-29 NOTE — Telephone Encounter (Signed)
Pt called again to check about old BP medication. Please advise.

## 2019-01-29 NOTE — Telephone Encounter (Signed)
Patient notified and verbalized understanding. Appointment scheduled for 02/09/19.

## 2019-01-29 NOTE — Telephone Encounter (Signed)
I will send back through the lower dose of amlodipine for him to restart and he should come in for recheck in about 2 weeks. If dizzy or numbers running low he should call back before then

## 2019-02-09 ENCOUNTER — Ambulatory Visit: Payer: Self-pay | Admitting: Family Medicine

## 2019-02-21 ENCOUNTER — Other Ambulatory Visit: Payer: Self-pay | Admitting: Family Medicine

## 2019-02-21 MED ORDER — AMLODIPINE BESYLATE 5 MG PO TABS: 5.0000 mg | ORAL_TABLET | Freq: Every day | ORAL | 0 refills | Status: DC

## 2019-02-21 MED ORDER — METOPROLOL SUCCINATE ER 25 MG PO TB24: 25.0000 mg | ORAL_TABLET | Freq: Every day | ORAL | 0 refills | Status: DC

## 2019-02-21 NOTE — Telephone Encounter (Signed)
Medication Refill - Medication: amLODipine (NORVASC) 5 MG tablet  metoprolol succinate (TOPROL-XL) 25 MG 24 hr   Has the patient contacted their pharmacy? Yes.   (Agent: If no, request that the patient contact the pharmacy for the refill.) (Agent: If yes, when and what did the pharmacy advise?)  Preferred Pharmacy (with phone number or street name):  [facility] (N), [place] [facility] [place] [facility] [place] [address]  Phone: [phone] Fax: [phone]     Agent: Please be advised that RX refills may take up to 3 business days. We ask that you follow-up with your pharmacy.

## 2019-04-06 ENCOUNTER — Ambulatory Visit: Payer: Self-pay

## 2019-04-06 NOTE — Telephone Encounter (Signed)
Patient called stating that he has had high BP for about a week. He is sitting at [facility] with his wife now. He has not checked it today. Yesterday BP 204/120. He denies symptoms. He has Hx of HTN and heart surgery 3 months ago. He is taking his medications as prescribed. Patient will go to ER at [facility] to have his BP evaluated. He will call back as advised by ER.  Reason for Disposition . [8] Systolic BP  >= 003 OR Diastolic >= 491 AND [7] cardiac or neurologic symptoms (e.g., chest pain, difficulty breathing, unsteady gait, blurred vision)  Answer Assessment - Initial Assessment Questions 1. BLOOD PRESSURE: "What is the blood pressure?" "Did you take at least two measurements 5 minutes apart?"     204/120 yesterday 2. ONSET: "When did you take your blood pressure?"     1week 3. HOW: "How did you obtain the blood pressure?" (e.g., visiting nurse, automatic home BP monitor)     Home monitor 4. HISTORY: "Do you have a history of high blood pressure?"     yes 5. MEDICATIONS: "Are you taking any medications for blood pressure?" "Have you missed any doses recently?"   No taking meds as prescribed 6. OTHER SYMPTOMS: "Do you have any symptoms?" (e.g., headache, chest pain, blurred vision, difficulty breathing, weakness)     Had heart surgery 3 months ago 7. PREGNANCY: "Is there any chance you are pregnant?" "When was your last menstrual period?"     N/A  Protocols used: HIGH BLOOD PRESSURE-A-AH

## 2019-04-25 ENCOUNTER — Other Ambulatory Visit: Payer: Self-pay

## 2019-04-25 ENCOUNTER — Ambulatory Visit (INDEPENDENT_AMBULATORY_CARE_PROVIDER_SITE_OTHER): Payer: Medicaid Other | Admitting: Urology

## 2019-04-25 ENCOUNTER — Encounter: Payer: Self-pay | Admitting: Urology

## 2019-04-25 VITALS — BP 178/122 | HR 85 | Ht 64.0 in | Wt 140.0 lb

## 2019-04-25 DIAGNOSIS — Z8551 Personal history of malignant neoplasm of bladder: Secondary | ICD-10-CM

## 2019-04-25 NOTE — Progress Notes (Signed)
04/25/2019 11:40 AM   Anthony Hess 01/13/1956 [id number]  Referring provider: Volney American, PA-C [address] [place],  [place] [address]  Chief Complaint  Patient presents with  . Follow-up    Urologic history: -Hospitalized12/16/2018-12/21/2018to [facility] for hematuria and acute kidney injury with creatinine 6.9 and CT showing severe bilateral hydronephrosis and hyperdense material in the bladder with an antecedent 1 year history of intermittent gross hematuria and voiding difficulty.  -Initial placement bilateral percutaneous nephrostomy tubes 02/07/2017  -Cystoscopy with clot evacuation; TURBT performed 02/09/2017  -Intraoperative findings extensive papillary tumor estimated >5 cm occupying the entire trigone and obscuring both ureteral orifice ease. The tumor was incompletely resected  -Pathology:Taurothelial carcinoma of the bladder, low-grade  -Repeat TURBT 03/15/2017 [place] with findings of extensive tumor over the entire right lateral wall and dome. Estimated approximately 90% of the tumor resected.  -Pathology low-grade urothelial carcinoma; equivocal subepithelial invasion; no muscle invasion noted  -Nephrostomy tubes converted to bilateral ureteral stents 04/06/2017  -Cystoscopy under anesthesia 04/25/2017; 4 cm papillary tumor posterior wall of the bladder entirely resected  -Pathology Taurothelial carcinoma, low-grade; muscle present and not involved  -Ureteral stents removed 05/02/2017  -6-week course of intravesical BCG recommended however patient did not follow-up stating he moved to [place] and did not establish urologic care in the area.  HPI: 64 yo presents for follow-up.  Office surveillance cystoscopy performed late August 2020 showed a large amount of sediment and suboptimal visualization.  He was scheduled for cystoscopy under anesthesia with possible bladder  biopsy/TURBT.  He was scheduled in late October 2020 however his preop ECG was abnormal and Myoview showed <30% EF with anterior, apical and septal scars.  Cardiac catheterization showed significant two-vessel coronary artery disease and he subsequently underwent CABG in [facility] on 11/4.  He saw his CT surgeon in late November and was cleared for his urologic procedure.   PMH: Past Medical History:  Diagnosis Date  . Bladder tumor   . Cancer [facility])    Bladder  . Chronic kidney disease    renal insufficiency  . Coronary artery disease   . Hypertension   . Myocardial infarction ([place])   . Wears glasses     Surgical History: Past Surgical History:  Procedure Laterality Date  . CORONARY ARTERY BYPASS GRAFT N/A 12/27/2018   Procedure: CORONARY ARTERY BYPASS GRAFTING (CABG) X 4 ON PUMP USING RIGHT & LEFT INTERNAL MAMMARY ARTERY LEFT RADIAL ARTERY ENDOSCOPICALLY HARVESTED;  Surgeon: [name], MD;  Location: [facility];  Service: Open Heart Surgery;  Laterality: N/A;  . LEFT HEART CATH AND CORONARY ANGIOGRAPHY Left 12/20/2018   Procedure: LEFT HEART CATH AND CORONARY ANGIOGRAPHY;  Surgeon: Isaias Cowman, MD;  Location: [facility] CV LAB;  Service: Cardiovascular;  Laterality: Left;  . RADIAL ARTERY HARVEST Left 12/27/2018   Procedure: ENDOSCOPIC RADIAL ARTERY HARVEST;  Surgeon: [name], MD;  Location: [facility];  Service: Open Heart Surgery;  Laterality: Left;  . TEE WITHOUT CARDIOVERSION N/A 12/27/2018   Procedure: TRANSESOPHAGEAL ECHOCARDIOGRAM (TEE);  Surgeon: [name], MD;  Location: [facility];  Service: Open Heart Surgery;  Laterality: N/A;  . TUMOR REMOVAL  2019   Bladder    Home Medications:  Allergies as of 04/25/2019   No Known Allergies     Medication List       Accurate as of April 25, 2019 11:40 AM. If you have any questions, ask your nurse or doctor.  amLODipine 5 MG tablet Commonly known as: NORVASC Take 1 tablet (5 mg total) by mouth  daily.   aspirin EC 81 MG tablet Take 81 mg by mouth daily.   atorvastatin 40 MG tablet Commonly known as: LIPITOR Take 1 tablet (40 mg total) by mouth daily at 6 PM.   clopidogrel 75 MG tablet Commonly known as: PLAVIX Take 1 tablet (75 mg total) by mouth daily.   metoprolol succinate 25 MG 24 hr tablet Commonly known as: TOPROL-XL Take 1 tablet (25 mg total) by mouth daily.       Allergies: No Known Allergies  Family History: Family History  Family history unknown: Yes    Social History:  reports that he has been smoking cigarettes. He has been smoking about 0.25 packs per day. He has never used smokeless tobacco. He reports that he does not drink alcohol or use drugs.   Physical Exam: BP (!) 178/122 (BP Location: Left Arm, Patient Position: Sitting, Cuff Size: Large)   Pulse 85   Ht 5\' 4"  (1.626 m)   Wt 140 lb (63.5 kg)   BMI 24.03 kg/m   Constitutional:  Alert and oriented, No acute distress. HEENT: [place] AT, moist mucus membranes.  Trachea midline, no masses. Cardiovascular: No clubbing, cyanosis, or edema.  RRR Respiratory: Normal respiratory effort, no increased work of breathing.  Clear GI: Abdomen is soft, nontender, nondistended, no abdominal masses GU: No CVA tenderness Lymph: No cervical or inguinal lymphadenopathy. Skin: No rashes, bruises or suspicious lesions. Neurologic: Grossly intact, no focal deficits, moving all 4 extremities. Psychiatric: Normal mood and affect.   Assessment & Plan:    - History urothelial carcinoma bladder Suboptimal surveillance cystoscopy.  He desires to reschedule cystoscopy under sedation/anesthesia and possible bladder biopsy/TURBT.  He has been cleared for surgery by his CT surgeon however will need to see if his Plavix can be held preprocedure.  The procedure was cussed in detail potential risk of bleeding, infection as well as anesthetic risks.  He indicated all questions were answered and desires to  proceed.   Abbie Sons, [place] [address] [place], [place] [address] [phone]

## 2019-04-25 NOTE — H&P (View-Only) (Signed)
04/25/2019 11:40 AM   Jens Som 1955-04-10 [id number]  Referring provider: Volney American, PA-C [address] [place],  [place] [address]  Chief Complaint  Patient presents with  . Follow-up    Urologic history: -Hospitalized12/16/2018-12/21/2018to [facility] for hematuria and acute kidney injury with creatinine 6.9 and CT showing severe bilateral hydronephrosis and hyperdense material in the bladder with an antecedent 1 year history of intermittent gross hematuria and voiding difficulty.  -Initial placement bilateral percutaneous nephrostomy tubes 02/07/2017  -Cystoscopy with clot evacuation; TURBT performed 02/09/2017  -Intraoperative findings extensive papillary tumor estimated >5 cm occupying the entire trigone and obscuring both ureteral orifice ease. The tumor was incompletely resected  -Pathology:Taurothelial carcinoma of the bladder, low-grade  -Repeat TURBT 03/15/2017 [place] with findings of extensive tumor over the entire right lateral wall and dome. Estimated approximately 90% of the tumor resected.  -Pathology low-grade urothelial carcinoma; equivocal subepithelial invasion; no muscle invasion noted  -Nephrostomy tubes converted to bilateral ureteral stents 04/06/2017  -Cystoscopy under anesthesia 04/25/2017; 4 cm papillary tumor posterior wall of the bladder entirely resected  -Pathology Taurothelial carcinoma, low-grade; muscle present and not involved  -Ureteral stents removed 05/02/2017  -6-week course of intravesical BCG recommended however patient did not follow-up stating he moved to [place] and did not establish urologic care in the area.  HPI: 64 yo presents for follow-up.  Office surveillance cystoscopy performed late August 2020 showed a large amount of sediment and suboptimal visualization.  He was scheduled for cystoscopy under anesthesia with possible bladder  biopsy/TURBT.  He was scheduled in late October 2020 however his preop ECG was abnormal and Myoview showed <30% EF with anterior, apical and septal scars.  Cardiac catheterization showed significant two-vessel coronary artery disease and he subsequently underwent CABG in [facility] on 11/4.  He saw his CT surgeon in late November and was cleared for his urologic procedure.   PMH: Past Medical History:  Diagnosis Date  . Bladder tumor   . Cancer [facility])    Bladder  . Chronic kidney disease    renal insufficiency  . Coronary artery disease   . Hypertension   . Myocardial infarction ([place])   . Wears glasses     Surgical History: Past Surgical History:  Procedure Laterality Date  . CORONARY ARTERY BYPASS GRAFT N/A 12/27/2018   Procedure: CORONARY ARTERY BYPASS GRAFTING (CABG) X 4 ON PUMP USING RIGHT & LEFT INTERNAL MAMMARY ARTERY LEFT RADIAL ARTERY ENDOSCOPICALLY HARVESTED;  Surgeon: [name], MD;  Location: [facility];  Service: Open Heart Surgery;  Laterality: N/A;  . LEFT HEART CATH AND CORONARY ANGIOGRAPHY Left 12/20/2018   Procedure: LEFT HEART CATH AND CORONARY ANGIOGRAPHY;  Surgeon: Isaias Cowman, MD;  Location: [facility] CV LAB;  Service: Cardiovascular;  Laterality: Left;  . RADIAL ARTERY HARVEST Left 12/27/2018   Procedure: ENDOSCOPIC RADIAL ARTERY HARVEST;  Surgeon: [name], MD;  Location: [facility];  Service: Open Heart Surgery;  Laterality: Left;  . TEE WITHOUT CARDIOVERSION N/A 12/27/2018   Procedure: TRANSESOPHAGEAL ECHOCARDIOGRAM (TEE);  Surgeon: [name], MD;  Location: [facility];  Service: Open Heart Surgery;  Laterality: N/A;  . TUMOR REMOVAL  2019   Bladder    Home Medications:  Allergies as of 04/25/2019   No Known Allergies     Medication List       Accurate as of April 25, 2019 11:40 AM. If you have any questions, ask your nurse or doctor.  amLODipine 5 MG tablet Commonly known as: NORVASC Take 1 tablet (5 mg total) by mouth  daily.   aspirin EC 81 MG tablet Take 81 mg by mouth daily.   atorvastatin 40 MG tablet Commonly known as: LIPITOR Take 1 tablet (40 mg total) by mouth daily at 6 PM.   clopidogrel 75 MG tablet Commonly known as: PLAVIX Take 1 tablet (75 mg total) by mouth daily.   metoprolol succinate 25 MG 24 hr tablet Commonly known as: TOPROL-XL Take 1 tablet (25 mg total) by mouth daily.       Allergies: No Known Allergies  Family History: Family History  Family history unknown: Yes    Social History:  reports that he has been smoking cigarettes. He has been smoking about 0.25 packs per day. He has never used smokeless tobacco. He reports that he does not drink alcohol or use drugs.   Physical Exam: BP (!) 178/122 (BP Location: Left Arm, Patient Position: Sitting, Cuff Size: Large)   Pulse 85   Ht 5\' 4"  (1.626 m)   Wt 140 lb (63.5 kg)   BMI 24.03 kg/m   Constitutional:  Alert and oriented, No acute distress. HEENT: [place] AT, moist mucus membranes.  Trachea midline, no masses. Cardiovascular: No clubbing, cyanosis, or edema.  RRR Respiratory: Normal respiratory effort, no increased work of breathing.  Clear GI: Abdomen is soft, nontender, nondistended, no abdominal masses GU: No CVA tenderness Lymph: No cervical or inguinal lymphadenopathy. Skin: No rashes, bruises or suspicious lesions. Neurologic: Grossly intact, no focal deficits, moving all 4 extremities. Psychiatric: Normal mood and affect.   Assessment & Plan:    - History urothelial carcinoma bladder Suboptimal surveillance cystoscopy.  He desires to reschedule cystoscopy under sedation/anesthesia and possible bladder biopsy/TURBT.  He has been cleared for surgery by his CT surgeon however will need to see if his Plavix can be held preprocedure.  The procedure was cussed in detail potential risk of bleeding, infection as well as anesthetic risks.  He indicated all questions were answered and desires to  proceed.   Abbie Sons, [place] [address] [place], [place] [address] [phone]

## 2019-05-02 ENCOUNTER — Other Ambulatory Visit: Payer: Self-pay | Admitting: Radiology

## 2019-05-02 DIAGNOSIS — Z8551 Personal history of malignant neoplasm of bladder: Secondary | ICD-10-CM

## 2019-05-04 ENCOUNTER — Other Ambulatory Visit: Payer: Self-pay | Admitting: Radiology

## 2019-05-07 ENCOUNTER — Other Ambulatory Visit: Payer: Self-pay | Admitting: *Deleted

## 2019-05-07 DIAGNOSIS — N390 Urinary tract infection, site not specified: Secondary | ICD-10-CM

## 2019-05-08 ENCOUNTER — Other Ambulatory Visit: Payer: Self-pay

## 2019-05-08 ENCOUNTER — Other Ambulatory Visit: Payer: Medicaid Other

## 2019-05-08 ENCOUNTER — Encounter
Admission: RE | Admit: 2019-05-08 | Discharge: 2019-05-08 | Disposition: A | Discharge status: Home / Self Care | Payer: Medicaid Other | Source: Ambulatory Visit | Attending: Urology | Admitting: Urology

## 2019-05-08 ENCOUNTER — Inpatient Hospital Stay: Admission: RE | Admit: 2019-05-08 | Payer: Medicaid Other | Source: Ambulatory Visit

## 2019-05-08 DIAGNOSIS — Z01818 Encounter for other preprocedural examination: Secondary | ICD-10-CM | POA: Diagnosis present

## 2019-05-08 DIAGNOSIS — N390 Urinary tract infection, site not specified: Secondary | ICD-10-CM

## 2019-05-08 HISTORY — DX: Personal history of urinary calculi: Z87.442

## 2019-05-08 LAB — CBC
HCT: 34.4 % — ABNORMAL LOW (ref 39.0–52.0)
Hemoglobin: 10.3 g/dL — ABNORMAL LOW (ref 13.0–17.0)
MCH: 24.8 pg — ABNORMAL LOW (ref 26.0–34.0)
MCHC: 29.9 g/dL — ABNORMAL LOW (ref 30.0–36.0)
MCV: 82.9 fL (ref 80.0–100.0)
Platelets: 323 10*3/uL (ref 150–400)
RBC: 4.15 MIL/uL — ABNORMAL LOW (ref 4.22–5.81)
RDW: 17.7 % — ABNORMAL HIGH (ref 11.5–15.5)
WBC: 6.6 10*3/uL (ref 4.0–10.5)
nRBC: 0 % (ref 0.0–0.2)

## 2019-05-08 LAB — BASIC METABOLIC PANEL
Anion gap: 9 (ref 5–15)
BUN: 77 mg/dL — ABNORMAL HIGH (ref 8–23)
CO2: 20 mmol/L — ABNORMAL LOW (ref 22–32)
Calcium: 7.5 mg/dL — ABNORMAL LOW (ref 8.9–10.3)
Chloride: 114 mmol/L — ABNORMAL HIGH (ref 98–111)
Creatinine, Ser: 3.4 mg/dL — ABNORMAL HIGH (ref 0.61–1.24)
GFR calc Af Amer: 21 mL/min — ABNORMAL LOW (ref >60)
GFR calc non Af Amer: 18 mL/min — ABNORMAL LOW (ref >60)
Glucose, Bld: 73 mg/dL (ref 70–99)
Potassium: 5.5 mmol/L — ABNORMAL HIGH (ref 3.5–5.1)
Sodium: 143 mmol/L (ref 135–145)

## 2019-05-08 NOTE — Patient Instructions (Signed)
Your procedure is scheduled on: Tues 3/23 Report to Day Surgery To find out your arrival time please call [phone] between 1PM - 3PM on Mon 3/22.  Remember: Instructions that are not followed completely may result in serious medical risk,  up to and including death, or upon the discretion of your surgeon and anesthesiologist your  surgery may need to be rescheduled.     _X__ 1. Do not eat food after midnight the night before your procedure.                 No gum chewing or hard candies. You may drink clear liquids up to 2 hours                 before you are scheduled to arrive for your surgery- DO not drink clear                 liquids within 2 hours of the start of your surgery.                 Clear Liquids include:  water, apple juice without pulp, clear Gatorade, G2 or                  Gatorade Zero (avoid Red/Purple/Blue), Black Coffee or Tea (Do not add                 anything to coffee or tea). _____2.   Complete the carbohydrate drink provided to you, 2 hours before arrival.  __X__2.  On the morning of surgery brush your teeth with toothpaste and water, you                may rinse your mouth with mouthwash if you wish.  Do not swallow any toothpaste of mouthwash.     ___ 3.  No Alcohol for 24 hours before or after surgery.   ___ 4.  Do Not Smoke or use e-cigarettes For 24 Hours Prior to Your Surgery.                 Do not use any chewable tobacco products for at least 6 hours prior to                 surgery.  ____  5.  Bring all medications with you on the day of surgery if instructed.   __x__  6.  Notify your doctor if there is any change in your medical condition      (cold, fever, infections).     Do not wear jewelry, make-up, hairpins, clips or nail polish. Do not wear lotions, powders, or perfumes. You may wear deodorant. Do not shave 48 hours prior to surgery. Men may shave face and neck. Do not bring valuables to the hospital.     [facility] is not responsible for any belongings or valuables.  Contacts, dentures or bridgework may not be worn into surgery. Leave your suitcase in the car. After surgery it may be brought to your room. For patients admitted to the hospital, discharge time is determined by your treatment team.   Patients discharged the day of surgery will not be allowed to drive home.   Make arrangements for someone to be with you for the first 24 hours of your Same Day Discharge.    Please read over the following fact sheets that you were given:       _x___ Take these medicines the morning of surgery with A SIP OF WATER:  1. amLODipine (NORVASC) 5 MG tablet  2. metoprolol succinate (TOPROL-XL) 25 MG 24 hr tablet  3. traMADol (ULTRAM) 50 MG tablet  If needed  4.  5.  6.  ____ Fleet Enema (as directed)   _x___ shower the night before and the morning of surgery.  Wear clean clothes to the hospital  ____ Use Benzoyl Peroxide Gel as instructed  ____ Use inhalers on the day of surgery  ____ Stop metformin 2 days prior to surgery    ____ Take 1/2 of usual insulin dose the night before surgery. No insulin the morning          of surgery.   _x___ Stop Plavix on 3/16     Continue aspirin.  Do not take the morning of surgery  __x__ Stop Anti-inflammatories no ibrprofen or aleve after 3/16   ____ Stop supplements until after surgery.    ____ Bring C-Pap to the hospital.

## 2019-05-09 LAB — MICROSCOPIC EXAMINATION: WBC, UA: >30 /hpf — AB (ref 0–5)

## 2019-05-09 LAB — URINALYSIS, COMPLETE
Bilirubin, UA: NEGATIVE
Glucose, UA: NEGATIVE
Ketones, UA: NEGATIVE
Nitrite, UA: NEGATIVE
Specific Gravity, UA: 1.02 (ref 1.005–1.030)
Urobilinogen, Ur: 0.2 mg/dL (ref 0.2–1.0)
pH, UA: 5.5 (ref 5.0–7.5)

## 2019-05-10 ENCOUNTER — Telehealth: Payer: Self-pay | Admitting: Radiology

## 2019-05-10 ENCOUNTER — Other Ambulatory Visit: Payer: Self-pay | Admitting: Radiology

## 2019-05-10 NOTE — Telephone Encounter (Signed)
-----   Message from Abbie Sons, MD sent at 05/09/2019  9:49 PM EDT ----- Potassium was elevated at 5.5.  Will need to repeat the morning of surgery

## 2019-05-11 ENCOUNTER — Other Ambulatory Visit
Admission: RE | Admit: 2019-05-11 | Discharge: 2019-05-11 | Disposition: A | Discharge status: Home / Self Care | Payer: Medicaid Other | Source: Ambulatory Visit | Attending: Urology | Admitting: Urology

## 2019-05-11 ENCOUNTER — Other Ambulatory Visit: Payer: Self-pay

## 2019-05-11 DIAGNOSIS — Z01812 Encounter for preprocedural laboratory examination: Secondary | ICD-10-CM | POA: Diagnosis present

## 2019-05-11 DIAGNOSIS — Z20822 Contact with and (suspected) exposure to covid-19: Secondary | ICD-10-CM | POA: Diagnosis not present

## 2019-05-11 LAB — SARS CORONAVIRUS 2 (TAT 6-24 HRS): SARS Coronavirus 2: NEGATIVE

## 2019-05-12 LAB — CULTURE, URINE COMPREHENSIVE

## 2019-05-14 ENCOUNTER — Telehealth: Payer: Self-pay | Admitting: Radiology

## 2019-05-14 ENCOUNTER — Other Ambulatory Visit: Payer: Self-pay | Admitting: Urology

## 2019-05-14 ENCOUNTER — Other Ambulatory Visit: Payer: Self-pay | Admitting: Radiology

## 2019-05-14 DIAGNOSIS — Z8551 Personal history of malignant neoplasm of bladder: Secondary | ICD-10-CM

## 2019-05-14 MED ORDER — SULFAMETHOXAZOLE-TRIMETHOPRIM 800-160 MG PO TABS: 1.0000 | ORAL_TABLET | Freq: Two times a day (BID) | ORAL | 0 refills | Status: AC

## 2019-05-14 NOTE — Telephone Encounter (Signed)
-----   Message from Abbie Sons, MD sent at 05/14/2019  7:35 AM EDT ----- Preop urine culture positive representing colonization.  Antibiotic Rx was sent to pharmacy.  Have him get 2 doses in today and please change preop IV antibiotic to Levaquin 500 mg.

## 2019-05-14 NOTE — Telephone Encounter (Signed)
LMOM to notify patient of script sent to pharmacy and importance of taking 2 doses today. Requested return call to confirm.

## 2019-05-15 ENCOUNTER — Encounter
Admission: RE | Disposition: A | Discharge status: Home / Self Care | Payer: Self-pay | Source: Home / Self Care | Attending: Urology

## 2019-05-15 ENCOUNTER — Ambulatory Visit: Payer: Medicaid Other | Admitting: Anesthesiology

## 2019-05-15 ENCOUNTER — Ambulatory Visit: Payer: Medicaid Other

## 2019-05-15 ENCOUNTER — Other Ambulatory Visit: Payer: Self-pay

## 2019-05-15 ENCOUNTER — Ambulatory Visit
Admission: RE | Admit: 2019-05-15 | Discharge: 2019-05-15 | Disposition: A | Discharge status: Home / Self Care | Payer: Medicaid Other | Attending: Urology | Admitting: Urology

## 2019-05-15 ENCOUNTER — Encounter: Payer: Self-pay | Admitting: Urology

## 2019-05-15 DIAGNOSIS — F17210 Nicotine dependence, cigarettes, uncomplicated: Secondary | ICD-10-CM | POA: Insufficient documentation

## 2019-05-15 DIAGNOSIS — N1330 Unspecified hydronephrosis: Secondary | ICD-10-CM | POA: Insufficient documentation

## 2019-05-15 DIAGNOSIS — N3080 Other cystitis without hematuria: Secondary | ICD-10-CM | POA: Insufficient documentation

## 2019-05-15 DIAGNOSIS — N189 Chronic kidney disease, unspecified: Secondary | ICD-10-CM | POA: Diagnosis not present

## 2019-05-15 DIAGNOSIS — E669 Obesity, unspecified: Secondary | ICD-10-CM | POA: Diagnosis not present

## 2019-05-15 DIAGNOSIS — Z7902 Long term (current) use of antithrombotics/antiplatelets: Secondary | ICD-10-CM | POA: Insufficient documentation

## 2019-05-15 DIAGNOSIS — I2510 Atherosclerotic heart disease of native coronary artery without angina pectoris: Secondary | ICD-10-CM | POA: Diagnosis not present

## 2019-05-15 DIAGNOSIS — I129 Hypertensive chronic kidney disease with stage 1 through stage 4 chronic kidney disease, or unspecified chronic kidney disease: Secondary | ICD-10-CM | POA: Insufficient documentation

## 2019-05-15 DIAGNOSIS — Z8551 Personal history of malignant neoplasm of bladder: Secondary | ICD-10-CM

## 2019-05-15 DIAGNOSIS — Z7982 Long term (current) use of aspirin: Secondary | ICD-10-CM | POA: Diagnosis not present

## 2019-05-15 DIAGNOSIS — Z951 Presence of aortocoronary bypass graft: Secondary | ICD-10-CM | POA: Diagnosis not present

## 2019-05-15 DIAGNOSIS — Z6824 Body mass index (BMI) 24.0-24.9, adult: Secondary | ICD-10-CM | POA: Insufficient documentation

## 2019-05-15 DIAGNOSIS — I252 Old myocardial infarction: Secondary | ICD-10-CM | POA: Insufficient documentation

## 2019-05-15 DIAGNOSIS — Z79899 Other long term (current) drug therapy: Secondary | ICD-10-CM | POA: Insufficient documentation

## 2019-05-15 HISTORY — PX: CYSTOSCOPY W/ RETROGRADES: SHX1426

## 2019-05-15 HISTORY — PX: CYSTOSCOPY WITH BIOPSY: SHX5122

## 2019-05-15 LAB — POCT I-STAT, CHEM 8
BUN: 50 mg/dL — ABNORMAL HIGH (ref 8–23)
Calcium, Ion: 1.07 mmol/L — ABNORMAL LOW (ref 1.15–1.40)
Chloride: 121 mmol/L — ABNORMAL HIGH (ref 98–111)
Creatinine, Ser: 3.2 mg/dL — ABNORMAL HIGH (ref 0.61–1.24)
Glucose, Bld: 87 mg/dL (ref 70–99)
HCT: 33 % — ABNORMAL LOW (ref 39.0–52.0)
Hemoglobin: 11.2 g/dL — ABNORMAL LOW (ref 13.0–17.0)
Potassium: 4.7 mmol/L (ref 3.5–5.1)
Sodium: 143 mmol/L (ref 135–145)
TCO2: 16 mmol/L — ABNORMAL LOW (ref 22–32)

## 2019-05-15 SURGERY — CYSTOSCOPY, WITH RETROGRADE PYELOGRAM
Anesthesia: General | Site: Ureter

## 2019-05-15 MED ORDER — FAMOTIDINE 20 MG PO TABS
ORAL_TABLET | ORAL | Status: AC
  Filled 2019-05-15: qty 1

## 2019-05-15 MED ORDER — PROPOFOL 10 MG/ML IV BOLUS
INTRAVENOUS | Status: AC
  Filled 2019-05-15: qty 20

## 2019-05-15 MED ORDER — FAMOTIDINE 20 MG PO TABS
20.0000 mg | ORAL_TABLET | Freq: Once | ORAL | Status: AC
  Administered 2019-05-15: 20 mg via ORAL

## 2019-05-15 MED ORDER — DEXAMETHASONE SODIUM PHOSPHATE 10 MG/ML IJ SOLN
INTRAMUSCULAR | Status: DC | PRN
  Administered 2019-05-15: 5 mg via INTRAVENOUS

## 2019-05-15 MED ORDER — MIDAZOLAM HCL 2 MG/2ML IJ SOLN
INTRAMUSCULAR | Status: DC | PRN
  Administered 2019-05-15: 2 mg via INTRAVENOUS

## 2019-05-15 MED ORDER — DEXAMETHASONE SODIUM PHOSPHATE 10 MG/ML IJ SOLN
INTRAMUSCULAR | Status: AC
  Filled 2019-05-15: qty 1

## 2019-05-15 MED ORDER — LIDOCAINE HCL (CARDIAC) PF 100 MG/5ML IV SOSY
PREFILLED_SYRINGE | INTRAVENOUS | Status: DC | PRN
  Administered 2019-05-15: 70 mg via INTRAVENOUS

## 2019-05-15 MED ORDER — ONDANSETRON HCL 4 MG/2ML IJ SOLN: 4.0000 mg | Freq: Once | INTRAMUSCULAR | Status: DC | PRN

## 2019-05-15 MED ORDER — LEVOFLOXACIN IN D5W 500 MG/100ML IV SOLN
INTRAVENOUS | Status: AC
  Filled 2019-05-15: qty 100

## 2019-05-15 MED ORDER — LEVOFLOXACIN IN D5W 500 MG/100ML IV SOLN
500.0000 mg | INTRAVENOUS | Status: AC
  Administered 2019-05-15: 500 mg via INTRAVENOUS

## 2019-05-15 MED ORDER — PROPOFOL 10 MG/ML IV BOLUS
INTRAVENOUS | Status: DC | PRN
  Administered 2019-05-15: 120 mg via INTRAVENOUS

## 2019-05-15 MED ORDER — ONDANSETRON HCL 4 MG/2ML IJ SOLN
INTRAMUSCULAR | Status: AC
  Filled 2019-05-15: qty 2

## 2019-05-15 MED ORDER — EPHEDRINE 5 MG/ML INJ
INTRAVENOUS | Status: AC
  Filled 2019-05-15: qty 10

## 2019-05-15 MED ORDER — MIDAZOLAM HCL 2 MG/2ML IJ SOLN
INTRAMUSCULAR | Status: AC
  Filled 2019-05-15: qty 2

## 2019-05-15 MED ORDER — EPHEDRINE SULFATE 50 MG/ML IJ SOLN
INTRAMUSCULAR | Status: DC | PRN
  Administered 2019-05-15 (×2): 10 mg via INTRAVENOUS
  Administered 2019-05-15: 5 mg via INTRAVENOUS

## 2019-05-15 MED ORDER — LACTATED RINGERS IV SOLN: INTRAVENOUS | Status: DC

## 2019-05-15 MED ORDER — FENTANYL CITRATE (PF) 100 MCG/2ML IJ SOLN
INTRAMUSCULAR | Status: DC | PRN
  Administered 2019-05-15 (×2): 25 ug via INTRAVENOUS

## 2019-05-15 MED ORDER — IOHEXOL 180 MG/ML  SOLN
INTRAMUSCULAR | Status: DC | PRN
  Administered 2019-05-15: 40 mL via ORAL

## 2019-05-15 MED ORDER — FENTANYL CITRATE (PF) 100 MCG/2ML IJ SOLN
INTRAMUSCULAR | Status: AC
  Filled 2019-05-15: qty 2

## 2019-05-15 MED ORDER — ONDANSETRON HCL 4 MG/2ML IJ SOLN
INTRAMUSCULAR | Status: DC | PRN
  Administered 2019-05-15: 4 mg via INTRAVENOUS

## 2019-05-15 MED ORDER — FENTANYL CITRATE (PF) 100 MCG/2ML IJ SOLN: 25.0000 ug | INTRAMUSCULAR | Status: DC | PRN

## 2019-05-15 SURGICAL SUPPLY — 35 items
BAG DRAIN CYSTO-URO LG1000N (MISCELLANEOUS) ×4 IMPLANT
BAG URINE DRAIN 2000ML AR STRL (UROLOGICAL SUPPLIES) ×4 IMPLANT
BRUSH SCRUB EZ  4% CHG (MISCELLANEOUS) ×2
BRUSH SCRUB EZ 1% IODOPHOR (MISCELLANEOUS) ×4 IMPLANT
BRUSH SCRUB EZ 4% CHG (MISCELLANEOUS) ×2 IMPLANT
CATH FOLEY 2WAY 18X30 (CATHETERS) IMPLANT
CATH FOLEY 2WAY SIL 18X30 (CATHETERS)
CATH URETL 5X70 OPEN END (CATHETERS) ×6 IMPLANT
DRAPE UTILITY 15X26 TOWEL STRL (DRAPES) ×4 IMPLANT
DRSG TELFA 4X3 1S NADH ST (GAUZE/BANDAGES/DRESSINGS) ×4 IMPLANT
ELECT LOOP 22F BIPOLAR SML (ELECTROSURGICAL)
ELECT REM PT RETURN 9FT ADLT (ELECTROSURGICAL) ×4
ELECTRODE LOOP 22F BIPOLAR SML (ELECTROSURGICAL) IMPLANT
ELECTRODE REM PT RTRN 9FT ADLT (ELECTROSURGICAL) ×2 IMPLANT
GLOVE BIO SURGEON STRL SZ8 (GLOVE) ×4 IMPLANT
GOWN STRL REUS W/ TWL LRG LVL3 (GOWN DISPOSABLE) ×4 IMPLANT
GOWN STRL REUS W/ TWL XL LVL3 (GOWN DISPOSABLE) ×2 IMPLANT
GOWN STRL REUS W/TWL LRG LVL3 (GOWN DISPOSABLE) ×4
GOWN STRL REUS W/TWL XL LVL3 (GOWN DISPOSABLE) ×6 IMPLANT
GOWN STRL REUS W/TWL XL LVL4 (GOWN DISPOSABLE) ×4 IMPLANT
GUIDEWIRE STR DUAL SENSOR (WIRE) ×4 IMPLANT
IV NS IRRIG 3000ML ARTHROMATIC (IV SOLUTION) ×8 IMPLANT
KIT TURNOVER CYSTO (KITS) ×4 IMPLANT
LOOP CUT BIPOLAR 24F LRG (ELECTROSURGICAL) IMPLANT
NDL SAFETY ECLIPSE 18X1.5 (NEEDLE) ×2 IMPLANT
NEEDLE HYPO 18GX1.5 SHARP (NEEDLE)
PACK CYSTO AR (MISCELLANEOUS) ×4 IMPLANT
SET CYSTO W/LG BORE CLAMP LF (SET/KITS/TRAYS/PACK) ×4 IMPLANT
SET IRRIG Y TYPE TUR BLADDER L (SET/KITS/TRAYS/PACK) ×4 IMPLANT
SOL .9 NS 3000ML IRR  AL (IV SOLUTION) ×2
SOL .9 NS 3000ML IRR UROMATIC (IV SOLUTION) ×2 IMPLANT
SURGILUBE 2OZ TUBE FLIPTOP (MISCELLANEOUS) ×4 IMPLANT
SYRINGE IRR TOOMEY STRL 70CC (SYRINGE) ×4 IMPLANT
WATER STERILE IRR 1000ML POUR (IV SOLUTION) ×4 IMPLANT
WATER STERILE IRR 3000ML UROMA (IV SOLUTION) ×4 IMPLANT

## 2019-05-15 NOTE — Anesthesia Procedure Notes (Signed)
Procedure Name: LMA Insertion Date/Time: 05/15/2019 11:00 AM Performed by: Aline Brochure, CRNA Pre-anesthesia Checklist: Patient identified, Emergency Drugs available, Suction available and Patient being monitored Patient Re-evaluated:Patient Re-evaluated prior to induction Oxygen Delivery Method: Circle system utilized Preoxygenation: Pre-oxygenation with 100% oxygen Induction Type: IV induction Ventilation: Mask ventilation without difficulty LMA: LMA inserted LMA Size: 4.0 Number of attempts: 1 Placement Confirmation: positive ETCO2 Tube secured with: Tape Dental Injury: Teeth and Oropharynx as per pre-operative assessment

## 2019-05-15 NOTE — Interval H&P Note (Signed)
History and Physical Interval Note:  05/15/2019 9:54 AM  Anthony Hess  has presented today for surgery, with the diagnosis of history of bladder cancer.  The various methods of treatment have been discussed with the patient and family. After consideration of risks, benefits and other options for treatment, the patient has consented to  Procedure(s): CYSTOSCOPY WITH RETROGRADE PYELOGRAM (Bilateral) TRANSURETHRAL RESECTION OF BLADDER TUMOR (TURBT) (N/A) CYSTOSCOPY WITH bladder BIOPSY (N/A) as a surgical intervention.  The patient's history has been reviewed, patient examined, no change in status, stable for surgery.  I have reviewed the patient's chart and labs.  Questions were answered to the patient's satisfaction.     [place]

## 2019-05-15 NOTE — Op Note (Signed)
Preoperative diagnosis:  1. History urothelial carcinoma the bladder 2. Bilateral hydronephrosis  Postoperative diagnosis:  1. Same  Procedure: 1. Cystoscopy with bladder biopsies 2. Bilateral retrograde pyelograms with interpretation  Surgeon: Abbie Sons, MD  Anesthesia: General  Complications: None  Intraoperative findings:  1.  No bladder lesions noted 2.  Left retrograde pyelogram shows moderate to severe hydronephrosis and tortuosity and dilation of the ureter.  Prompt emptying of the system with mild retention in the renal pelvis.  No filling defects. 3.  Right retrograde pyelogram with moderate to severe hydronephrosis and tortuosity and dilation of the ureter.  Prompt emptying of the system under real-time fluoroscopy with moderate contrast retention in the renal pelvis.  No filling defects noted  EBL: Minimal  Specimens: Bladder biopsies  Indication: Anthony Hess is a 64 y.o. patient with a history of urothelial carcinoma the bladder.  Refer to my H&P for clinical summary.  He underwent office cystoscopy September 2020 however visualization was suboptimal due to marked urinary sediment.  Cystoscopy under anesthesia was recommended however he subsequently was hospitalized and underwent CABG.  He has been cleared for the procedure by his cardiothoracic surgeon and has stopped his anticoagulation.  After reviewing the management options for treatment, he elected to proceed with the above surgical procedure(s). We have discussed the potential benefits and risks of the procedure, side effects of the proposed treatment, the likelihood of the patient achieving the goals of the procedure, and any potential problems that might occur during the procedure or recuperation. Informed consent has been obtained.  Description of procedure:  The patient was taken to the operating room and general anesthesia was induced.  The patient was placed in the dorsal lithotomy position, prepped and  draped in the usual sterile fashion, and preoperative antibiotics were administered. A preoperative time-out was performed.   A 26 French continuous-flow resectoscope sheath with visual obturator was lubricated and passed under direct vision.  There was a wide caliber bulbar stricture present which the cystoscope easily negotiated.  The prostate was remarkable for moderate lateral lobe enlargement.    The obturator was exchanged for an Saxton with loop and the bladder mucosa was closely inspected. The bladder was trabeculated with early cellule formation.  No other diverticula were noted.  No solid or papillary bladder lesions were identified.  There was minimal mucosal erythema of the posterior wall and bladder base.  The ureteral orifices were patulous bilaterally.  A 5 French open-ended catheter was placed through the cystoscope into the left ureteral orifice and advanced to the mid ureter.  There was backflow of contrast with poor visualization of the proximal system.  A 0.038 sensor wire was placed through the ureteral catheter and the catheter was advanced to the renal pelvis.  Repeat retrograde pyelogram was performed with findings as described above.  An identical procedure was then performed on the contralateral side with findings as described above.  Cold biopsies x3 were then taken of the areas of erythema with a flexible biopsy forceps placed through a laser bridge.  The laser bridge was exchanged for the resectoscope and the biopsy sites were then cauterized.  The bladder was emptied and the resectoscope was removed.  After anesthetic reversal patient was transported to the PACU in stable condition.  Plan: -Foley catheter was not placed -He will be contacted with his pathology results and recommended follow-up    Abbie Sons, M.D.

## 2019-05-15 NOTE — Transfer of Care (Signed)
Immediate Anesthesia Transfer of Care Note  Patient: Anthony Hess  Procedure(s) Performed: CYSTOSCOPY WITH RETROGRADE PYELOGRAM (Bilateral Ureter) CYSTOSCOPY WITH bladder BIOPSY (N/A )  Patient Location: PACU  Anesthesia Type:General  Level of Consciousness: sedated  Airway & Oxygen Therapy: Patient connected to face mask oxygen  Post-op Assessment: Post -op Vital signs reviewed and stable  Post vital signs: stable  Last Vitals:  Vitals Value Taken Time  BP 101/73 05/15/19 1144  Temp 36.2 C 05/15/19 1144  Pulse 72 05/15/19 1146  Resp 14 05/15/19 1146  SpO2 100 % 05/15/19 1146  Vitals shown include unvalidated device data.  Last Pain:  Vitals:   05/15/19 1144  TempSrc:   PainSc: Asleep         Complications: No apparent anesthesia complications

## 2019-05-15 NOTE — OR Nursing (Signed)
Per Dr. Elenor Quinones verbal, may resume aspirin and plavix today.  Added to discharge instructions under medication section.

## 2019-05-15 NOTE — Anesthesia Preprocedure Evaluation (Signed)
Anesthesia Evaluation  Patient identified by MRN, date of birth, ID band Patient awake    Reviewed: Allergy & Precautions, NPO status , Patient's Chart, lab work & pertinent test results  History of Anesthesia Complications Negative for: history of anesthetic complications  Airway Mallampati: II  TM Distance: >3 FB Neck ROM: Full    Dental  (+) Poor Dentition, Chipped, Missing   Pulmonary neg sleep apnea, COPD, former smoker,    breath sounds clear to auscultation- rhonchi (-) wheezing      Cardiovascular hypertension, Pt. on medications + CAD, + Past MI and + CABG  (-) Cardiac Stents  Rhythm:Regular Rate:Normal - Systolic murmurs and - Diastolic murmurs    Neuro/Psych neg Seizures negative neurological ROS  negative psych ROS   GI/Hepatic negative GI ROS, Neg liver ROS,   Endo/Other  negative endocrine ROS  Renal/GU CRFRenal disease     Musculoskeletal negative musculoskeletal ROS (+)   Abdominal (+) - obese,   Peds  Hematology negative hematology ROS (+)   Anesthesia Other Findings Past Medical History: No date: Bladder tumor No date: Cancer (HCC)     Comment:  Bladder No date: Chronic kidney disease     Comment:  renal insufficiency No date: Coronary artery disease No date: History of kidney stones No date: Hypertension No date: Myocardial infarction ([place]) No date: Wears glasses   Reproductive/Obstetrics                             Anesthesia Physical Anesthesia Plan  ASA: III  Anesthesia Plan: General   Post-op Pain Management:    Induction: Intravenous  PONV Risk Score and Plan: 1 and Ondansetron and Dexamethasone  Airway Management Planned: LMA  Additional Equipment:   Intra-op Plan:   Post-operative Plan:   Informed Consent: I have reviewed the patients History and Physical, chart, labs and discussed the procedure including the risks, benefits and  alternatives for the proposed anesthesia with the patient or authorized representative who has indicated his/her understanding and acceptance.     Dental advisory given  Plan Discussed with: CRNA and Anesthesiologist  Anesthesia Plan Comments:         Anesthesia Quick Evaluation

## 2019-05-15 NOTE — Anesthesia Postprocedure Evaluation (Signed)
Anesthesia Post Note  Patient: Anthony Hess  Procedure(s) Performed: CYSTOSCOPY WITH RETROGRADE PYELOGRAM (Bilateral Ureter) CYSTOSCOPY WITH bladder BIOPSY (N/A )  Patient location during evaluation: PACU Anesthesia Type: General Level of consciousness: awake and alert and oriented Pain management: pain level controlled Vital Signs Assessment: post-procedure vital signs reviewed and stable Respiratory status: spontaneous breathing, nonlabored ventilation and respiratory function stable Cardiovascular status: blood pressure returned to baseline and stable Postop Assessment: no signs of nausea or vomiting Anesthetic complications: no     Last Vitals:  Vitals:   05/15/19 1215 05/15/19 1236  BP: (!) 138/94 (!) 145/88  Pulse: 72 67  Resp: 15 16  Temp:  (!) 36.3 C  SpO2: 100% 97%    Last Pain:  Vitals:   05/15/19 1236  TempSrc: Temporal  PainSc: 0-No pain                 Encarnacion Bole

## 2019-05-15 NOTE — Discharge Instructions (Addendum)
Bladder Biopsy   Definition:  Transurethral Resection of the Bladder Tumor is a surgical procedure used to diagnose and remove tumors within the bladder. T  General instructions:     Your recent bladder surgery requires very little post hospital care but some definite precautions.  Despite the fact that no skin incisions were used, the area around the bladder incisions are raw and covered with scabs to promote healing and prevent bleeding. Certain precautions are needed to insure that the scabs are not disturbed over the next 2-4 weeks while the healing proceeds.  Because the raw surface inside your bladder and the irritating effects of urine you may expect frequency of urination and/or urgency (a stronger desire to urinate) and perhaps even getting up at night more often. This will usually resolve or improve slowly over the healing period. You may see some blood in your urine over the first 6 weeks. Do not be alarmed, even if the urine was clear for a while. Get off your feet and drink lots of fluids until clearing occurs. If you start to pass clots or don't improve call us.  Diet:  You may return to your normal diet immediately. Because of the raw surface of your bladder, alcohol, spicy foods, foods high in acid and drinks with caffeine may cause irritation or frequency and should be used in moderation. To keep your urine flowing freely and avoid constipation, drink plenty of fluids during the day (8-10 glasses). Tip: Avoid cranberry juice because it is very acidic.  Activity:  Your physical activity doesn't need to be restricted. However, if you are very active, you may see some blood in the urine. We suggest that you reduce your activity under the circumstances until the bleeding has stopped.  Bowels:  It is important to keep your bowels regular during the postoperative period. Straining with bowel movements can cause bleeding. A bowel movement every other day is reasonable. Use a mild  laxative if needed, such as milk of magnesia 2-3 tablespoons, or 2 Dulcolax tablets. Call if you continue to have problems. If you had been taking narcotics for pain, before, during or after your surgery, you may be constipated. Take a laxative if necessary.    Medication:  You should resume your pre-surgery medications unless told not to. In addition you may be given an antibiotic to prevent or treat infection. Antibiotics are not always necessary. All medication should be taken as prescribed until the bottles are finished unless you are having an unusual reaction to one of the drugs.   [facility] [address] [place], [place] [address] [phone]    AMBULATORY SURGERY  DISCHARGE INSTRUCTIONS   1) The drugs that you were given will stay in your system until tomorrow so for the next 24 hours you should not:  A) Drive an automobile B) Make any legal decisions C) Drink any alcoholic beverage   2) You may resume regular meals tomorrow.  Today it is better to start with liquids and gradually work up to solid foods.  You may eat anything you prefer, but it is better to start with liquids, then soup and crackers, and gradually work up to solid foods.   3) Please notify your doctor immediately if you have any unusual bleeding, trouble breathing, redness and pain at the surgery site, drainage, fever, or pain not relieved by medication.    4) Additional Instructions:        Please contact your physician with any problems  or Same Day Surgery at [phone], Monday through Friday 6 am to 4 pm, or [place] at [facility] number at [phone].

## 2019-05-16 LAB — SURGICAL PATHOLOGY

## 2019-05-18 ENCOUNTER — Telehealth: Payer: Self-pay | Admitting: Urology

## 2019-05-18 NOTE — Telephone Encounter (Signed)
Spoke with pt gave results all questions answered appts made and mailed per pts request   Sharyn Lull

## 2019-05-18 NOTE — Telephone Encounter (Signed)
-----   Message from Abbie Sons, MD sent at 05/18/2019  8:11 AM EDT ----- Can let patient know his bladder biopsy showed no evidence of cancer.  Recommend office cystoscopy in 6 months.  UA 1 week prior to cystoscopy appointment

## 2019-05-22 ENCOUNTER — Telehealth: Payer: Self-pay | Admitting: Family Medicine

## 2019-05-22 NOTE — Telephone Encounter (Signed)
Called pt and let him know that he would need an appt, pt states that he will see how it goes and may talk to provider about at next appt 07/11/19

## 2019-05-22 NOTE — Telephone Encounter (Signed)
Needs appt  Copied from [facility] #[id number]. Topic: General - Other >> May 22, 2019  9:38 AM Keene Breath wrote: Reason for CRM: Patient would like a note to go to physical therapy.  He said he cannot afford a gym membership.  He needs this exercise for his heart condition.  CB# [phone]

## 2019-05-23 ENCOUNTER — Ambulatory Visit: Payer: Medicaid Other | Admitting: Urology

## 2019-05-31 ENCOUNTER — Other Ambulatory Visit: Payer: Self-pay | Admitting: Family Medicine

## 2019-05-31 MED ORDER — AMLODIPINE BESYLATE 5 MG PO TABS: 5.0000 mg | ORAL_TABLET | Freq: Every day | ORAL | 0 refills | Status: DC

## 2019-05-31 NOTE — Telephone Encounter (Signed)
Medication Refill - Medication: amLODipine (NORVASC) 5 MG tablet   Pt is out of medication  Has the patient contacted their pharmacy? Yes.   (Agent: If no, request that the patient contact the pharmacy for the refill.) (Agent: If yes, when and what did the pharmacy advise?)  Preferred Pharmacy (with phone number or street name):  [facility] [place] [facility] [place] Phone:  [phone]  Fax:  [phone]       Agent: Please be advised that RX refills may take up to 3 business days. We ask that you follow-up with your pharmacy.

## 2019-05-31 NOTE — Telephone Encounter (Signed)
Requested Prescriptions  Pending Prescriptions Disp Refills  . amLODipine (NORVASC) 5 MG tablet 90 tablet 0    Sig: Take 1 tablet (5 mg total) by mouth daily.     Cardiovascular:  Calcium Channel Blockers Failed - 05/31/2019 10:26 AM      Failed - Last BP in normal range    BP Readings from Last 1 Encounters:  05/15/19 (!) 145/88         Passed - Valid encounter within last 6 months    Recent Outpatient Visits          4 months ago S/P CABG x [place], [place]   6 months ago Abnormal EKG   [facility] Merrie Roof [place], [place]   9 months ago Essential hypertension   [facility] Merrie Roof [place], [place]   1 year ago Bilateral hydronephrosis   [facility] [place], [place]   1 year ago Bilateral hydronephrosis   [facility] [place], [place]      Future Appointments            In 1 month Orene Desanctis, Lilia Argue, [place], [place]

## 2019-06-03 ENCOUNTER — Emergency Department
Admission: EM | Admit: 2019-06-03 | Discharge: 2019-06-03 | Disposition: A | Discharge status: Home / Self Care | Payer: Medicaid Other | Attending: Student in an Organized Health Care Education/Training Program | Admitting: Student in an Organized Health Care Education/Training Program

## 2019-06-03 ENCOUNTER — Other Ambulatory Visit: Payer: Self-pay

## 2019-06-03 DIAGNOSIS — N184 Chronic kidney disease, stage 4 (severe): Secondary | ICD-10-CM | POA: Diagnosis not present

## 2019-06-03 DIAGNOSIS — R339 Retention of urine, unspecified: Secondary | ICD-10-CM | POA: Diagnosis not present

## 2019-06-03 DIAGNOSIS — I129 Hypertensive chronic kidney disease with stage 1 through stage 4 chronic kidney disease, or unspecified chronic kidney disease: Secondary | ICD-10-CM | POA: Insufficient documentation

## 2019-06-03 DIAGNOSIS — Z7901 Long term (current) use of anticoagulants: Secondary | ICD-10-CM | POA: Insufficient documentation

## 2019-06-03 DIAGNOSIS — Z79899 Other long term (current) drug therapy: Secondary | ICD-10-CM | POA: Diagnosis not present

## 2019-06-03 DIAGNOSIS — F17210 Nicotine dependence, cigarettes, uncomplicated: Secondary | ICD-10-CM | POA: Insufficient documentation

## 2019-06-03 DIAGNOSIS — Z951 Presence of aortocoronary bypass graft: Secondary | ICD-10-CM | POA: Diagnosis not present

## 2019-06-03 DIAGNOSIS — I2510 Atherosclerotic heart disease of native coronary artery without angina pectoris: Secondary | ICD-10-CM | POA: Insufficient documentation

## 2019-06-03 DIAGNOSIS — C679 Malignant neoplasm of bladder, unspecified: Secondary | ICD-10-CM | POA: Diagnosis not present

## 2019-06-03 DIAGNOSIS — I252 Old myocardial infarction: Secondary | ICD-10-CM | POA: Diagnosis not present

## 2019-06-03 DIAGNOSIS — Z7982 Long term (current) use of aspirin: Secondary | ICD-10-CM | POA: Insufficient documentation

## 2019-06-03 DIAGNOSIS — N3090 Cystitis, unspecified without hematuria: Secondary | ICD-10-CM

## 2019-06-03 LAB — URINALYSIS, COMPLETE (UACMP) WITH MICROSCOPIC
Bacteria, UA: NONE SEEN
Bilirubin Urine: NEGATIVE
Glucose, UA: NEGATIVE mg/dL
Ketones, ur: NEGATIVE mg/dL
Nitrite: NEGATIVE
Protein, ur: 100 mg/dL — AB
RBC / HPF: >50 RBC/hpf — ABNORMAL HIGH (ref 0–5)
Specific Gravity, Urine: 1.017 (ref 1.005–1.030)
Squamous Epithelial / HPF: NONE SEEN (ref 0–5)
WBC, UA: >50 WBC/hpf — ABNORMAL HIGH (ref 0–5)
pH: 6 (ref 5.0–8.0)

## 2019-06-03 LAB — BASIC METABOLIC PANEL
Anion gap: 7 (ref 5–15)
BUN: 55 mg/dL — ABNORMAL HIGH (ref 8–23)
CO2: 18 mmol/L — ABNORMAL LOW (ref 22–32)
Calcium: 7.1 mg/dL — ABNORMAL LOW (ref 8.9–10.3)
Chloride: 115 mmol/L — ABNORMAL HIGH (ref 98–111)
Creatinine, Ser: 3.26 mg/dL — ABNORMAL HIGH (ref 0.61–1.24)
GFR calc Af Amer: 22 mL/min — ABNORMAL LOW (ref >60)
GFR calc non Af Amer: 19 mL/min — ABNORMAL LOW (ref >60)
Glucose, Bld: 124 mg/dL — ABNORMAL HIGH (ref 70–99)
Potassium: 4.6 mmol/L (ref 3.5–5.1)
Sodium: 140 mmol/L (ref 135–145)

## 2019-06-03 LAB — CBC
HCT: 33.6 % — ABNORMAL LOW (ref 39.0–52.0)
Hemoglobin: 10 g/dL — ABNORMAL LOW (ref 13.0–17.0)
MCH: 24.9 pg — ABNORMAL LOW (ref 26.0–34.0)
MCHC: 29.8 g/dL — ABNORMAL LOW (ref 30.0–36.0)
MCV: 83.6 fL (ref 80.0–100.0)
Platelets: 254 10*3/uL (ref 150–400)
RBC: 4.02 MIL/uL — ABNORMAL LOW (ref 4.22–5.81)
RDW: 18.6 % — ABNORMAL HIGH (ref 11.5–15.5)
WBC: 12.2 10*3/uL — ABNORMAL HIGH (ref 4.0–10.5)
nRBC: 0 % (ref 0.0–0.2)

## 2019-06-03 LAB — LACTIC ACID, PLASMA: Lactic Acid, Venous: 1.1 mmol/L (ref 0.5–1.9)

## 2019-06-03 MED ORDER — SODIUM CHLORIDE 0.9 % IV SOLN
2.0000 g | Freq: Once | INTRAVENOUS | Status: AC
  Administered 2019-06-03: 2 g via INTRAVENOUS
  Filled 2019-06-03: qty 2

## 2019-06-03 MED ORDER — PROBIOTIC 250 MG PO CAPS: 1.0000 | ORAL_CAPSULE | Freq: Two times a day (BID) | ORAL | 0 refills | Status: DC

## 2019-06-03 MED ORDER — CIPROFLOXACIN HCL 250 MG PO TABS: 250.0000 mg | ORAL_TABLET | Freq: Two times a day (BID) | ORAL | 0 refills | Status: AC

## 2019-06-03 MED ORDER — SODIUM CHLORIDE 0.9 % IV BOLUS
500.0000 mL | Freq: Once | INTRAVENOUS | Status: AC
  Administered 2019-06-03: 500 mL via INTRAVENOUS

## 2019-06-03 NOTE — Progress Notes (Signed)
PHARMACY -  BRIEF ANTIBIOTIC NOTE   Pharmacy has received consult(s) for cefepime from an ED provider.  The patient's profile has been reviewed for ht/wt/allergies/indication/available labs.    One time order(s) placed for cefepime 2 g IV  Further antibiotics/pharmacy consults should be ordered by admitting physician if indicated.                       Thank you,  Tawnya Crook, PharmD 06/03/2019  12:33 PM

## 2019-06-03 NOTE — ED Triage Notes (Signed)
Pt states he had bladder surgery for bladder cancer 2 weeks ago and had been doing fine until yesterday started passing blood with clots and has not been able to pass urine only a few drops since last night.

## 2019-06-03 NOTE — Discharge Instructions (Addendum)
I have sent a prescription for antibiotics to your pharmacy.  Please stop taking your plavix for 4 days to help decrease bleeding.  You can then restart after 4 days (06/07/19).   If you have trouble emptying your bladder you will need to return to the ED.  Follow up with urology.  Be sure to drink plenty of fluids.

## 2019-06-03 NOTE — ED Provider Notes (Signed)
[facility] Emergency Department Provider Note    First MD Initiated Contact with Patient 06/03/19 1137     (approximate)  I have reviewed the triage vital signs and the nursing notes.   HISTORY  Chief Complaint Urinary Retention    HPI Anthony Hess is a 64 y.o. male history of bladder cancer status post recent cystoscopy and recent mission for cardiac disease on anticoagulation presents to the ER for evaluation of urinary retention dysuria starting last night.  States he is passing clots and having hematuria.  No flank pain.  No measured fevers.  States last time he was able to empty his bladder was around 9 last night.    Past Medical History:  Diagnosis Date  . Bladder tumor   . Cancer [facility])    Bladder  . Chronic kidney disease    renal insufficiency  . Coronary artery disease   . History of kidney stones   . Hypertension   . Myocardial infarction ([place])   . Wears glasses    Family History  Family history unknown: Yes   Past Surgical History:  Procedure Laterality Date  . CORONARY ARTERY BYPASS GRAFT N/A 12/27/2018   Procedure: CORONARY ARTERY BYPASS GRAFTING (CABG) X 4 ON PUMP USING RIGHT & LEFT INTERNAL MAMMARY ARTERY LEFT RADIAL ARTERY ENDOSCOPICALLY HARVESTED;  Surgeon: [name], MD;  Location: [place];  Service: Open Heart Surgery;  Laterality: N/A;  . CYSTOSCOPY W/ RETROGRADES Bilateral 05/15/2019   Procedure: CYSTOSCOPY WITH RETROGRADE PYELOGRAM;  Surgeon: Abbie Sons, MD;  Location: [facility] ORS;  Service: Urology;  Laterality: Bilateral;  . CYSTOSCOPY WITH BIOPSY N/A 05/15/2019   Procedure: CYSTOSCOPY WITH bladder BIOPSY;  Surgeon: Abbie Sons, MD;  Location: [facility];  Service: Urology;  Laterality: N/A;  . LEFT HEART CATH AND CORONARY ANGIOGRAPHY Left 12/20/2018   Procedure: LEFT HEART CATH AND CORONARY ANGIOGRAPHY;  Surgeon: Isaias Cowman, MD;  Location: [place] CV LAB;  Service: Cardiovascular;  Laterality:  Left;  . RADIAL ARTERY HARVEST Left 12/27/2018   Procedure: ENDOSCOPIC RADIAL ARTERY HARVEST;  Surgeon: [name], MD;  Location: [place];  Service: Open Heart Surgery;  Laterality: Left;  . TEE WITHOUT CARDIOVERSION N/A 12/27/2018   Procedure: TRANSESOPHAGEAL ECHOCARDIOGRAM (TEE);  Surgeon: [name], MD;  Location: [place];  Service: Open Heart Surgery;  Laterality: N/A;  . TUMOR REMOVAL  2019   Bladder   Patient Active Problem List   Diagnosis Date Noted  . Atrial fibrillation ([place]) 01/05/2019  . Chronic kidney disease (CKD), stage IV (severe) ([place]) 01/05/2019  . 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 Emphysema lung ([place]) 04/14/2018  . Bilateral hydronephrosis 04/03/2018  . Essential hypertension 03/27/2018  . Cigarette smoker 03/20/2018  . History of bladder cancer 03/20/2018      Prior to Admission medications   Medication Sig Start Date End Date Taking? Authorizing Provider  amLODipine (NORVASC) 5 MG tablet Take 1 tablet (5 mg total) by mouth daily. 05/31/19   Volney American, PA-C  aspirin EC 81 MG tablet Take 81 mg by mouth daily.    [name], MD  atorvastatin (LIPITOR) 40 MG tablet Take 1 tablet (40 mg total) by mouth daily at 6 PM. Patient taking differently: Take 40 mg by mouth daily.  01/05/19   Antony Odea, PA-C  ciprofloxacin (CIPRO) 250 MG tablet Take 1 tablet (250 mg total) by mouth 2 (two) times daily for 7 days. 06/03/19 06/10/19  Merlyn Lot,  MD  clopidogrel (PLAVIX) 75 MG tablet Take 1 tablet (75 mg total) by mouth daily. 01/06/19   Antony Odea, PA-C  metoprolol succinate (TOPROL-XL) 25 MG 24 hr tablet Take 1 tablet (25 mg total) by mouth daily. 02/21/19   Volney American, PA-C  Saccharomyces boulardii (PROBIOTIC) 250 MG CAPS Take 1 capsule by mouth in the morning and at bedtime. 06/03/19   Merlyn Lot, MD  traMADol (ULTRAM) 50 MG tablet Take 50 mg by mouth 2 (two) times daily as needed (pain.).    [provider]    Allergies Patient has no known allergies.    Social History Social History   Tobacco Use  . Smoking status: Current Every Day Smoker    Packs/day: 0.25    Types: Cigarettes    Last attempt to quit: 02/19/2019    Years since quitting: 0.2  . Smokeless tobacco: Never Used  Substance Use Topics  . Alcohol use: Never  . Drug use: Never    Review of Systems Patient denies headaches, rhinorrhea, blurry vision, numbness, shortness of breath, chest pain, edema, cough, abdominal pain, nausea, vomiting, diarrhea, dysuria, fevers, rashes or hallucinations unless otherwise stated above in HPI. ____________________________________________   PHYSICAL EXAM:  VITAL SIGNS: Vitals:   06/03/19 1024 06/03/19 1402  BP: (!) 157/77 (!) 121/96  Pulse: 77   Resp: 17   Temp: 97.7 F (36.5 C)   SpO2: 100%     Constitutional: Alert and oriented.  Eyes: Conjunctivae are normal.  Head: Atraumatic. Nose: No congestion/rhinnorhea. Mouth/Throat: Mucous membranes are moist.   Neck: No stridor. Painless ROM.  Cardiovascular: Normal rate, regular rhythm. Grossly normal heart sounds.  Good peripheral circulation. Respiratory: Normal respiratory effort.  No retractions. Lungs CTAB. Gastrointestinal: Soft and nontender. No distention. No abdominal bruits. No CVA tenderness. Musculoskeletal: No lower extremity tenderness nor edema.  No joint effusions. Neurologic:  Normal speech and language. No gross focal neurologic deficits are appreciated. No facial droop Skin:  Skin is warm, dry and intact. No rash noted. Psychiatric: Mood and affect are normal. Speech and behavior are normal.  ____________________________________________   LABS (all labs ordered are listed, but only abnormal results are displayed)  Results for orders placed or performed during the hospital encounter of 06/03/19 (from the past 24 hour(s))  Urinalysis, Complete w Microscopic     Status: Abnormal   Collection Time:  06/03/19 10:36 AM  Result Value Ref Range   Color, Urine YELLOW (A) YELLOW   APPearance TURBID (A) CLEAR   Specific Gravity, Urine 1.017 1.005 - 1.030   pH 6.0 5.0 - 8.0   Glucose, UA NEGATIVE NEGATIVE mg/dL   Hgb urine dipstick MODERATE (A) NEGATIVE   Bilirubin Urine NEGATIVE NEGATIVE   Ketones, ur NEGATIVE NEGATIVE mg/dL   Protein, ur 100 (A) NEGATIVE mg/dL   Nitrite NEGATIVE NEGATIVE   Leukocytes,Ua MODERATE (A) NEGATIVE   RBC / HPF >50 (H) 0 - 5 RBC/hpf   WBC, UA >50 (H) 0 - 5 WBC/hpf   Bacteria, UA NONE SEEN NONE SEEN   Squamous Epithelial / LPF NONE SEEN 0 - 5   WBC Clumps PRESENT   Basic metabolic panel     Status: Abnormal   Collection Time: 06/03/19 10:36 AM  Result Value Ref Range   Sodium 140 135 - 145 mmol/L   Potassium 4.6 3.5 - 5.1 mmol/L   Chloride 115 (H) 98 - 111 mmol/L   CO2 18 (L) 22 - 32 mmol/L   Glucose, Bld  124 (H) 70 - 99 mg/dL   BUN 55 (H) 8 - 23 mg/dL   Creatinine, Ser 3.26 (H) 0.61 - 1.24 mg/dL   Calcium 7.1 (L) 8.9 - 10.3 mg/dL   GFR calc non Af Amer 19 (L) >60 mL/min   GFR calc Af Amer 22 (L) >60 mL/min   Anion gap 7 5 - 15  CBC     Status: Abnormal   Collection Time: 06/03/19 10:36 AM  Result Value Ref Range   WBC 12.2 (H) 4.0 - 10.5 K/uL   RBC 4.02 (L) 4.22 - 5.81 MIL/uL   Hemoglobin 10.0 (L) 13.0 - 17.0 g/dL   HCT 33.6 (L) 39.0 - 52.0 %   MCV 83.6 80.0 - 100.0 fL   MCH 24.9 (L) 26.0 - 34.0 pg   MCHC 29.8 (L) 30.0 - 36.0 g/dL   RDW 18.6 (H) 11.5 - 15.5 %   Platelets 254 150 - 400 K/uL   nRBC 0.0 0.0 - 0.2 %  Lactic acid, plasma     Status: None   Collection Time: 06/03/19 12:57 PM  Result Value Ref Range   Lactic Acid, Venous 1.1 0.5 - 1.9 mmol/L   ____________________________________________ ____________________________________________  RADIOLOGY  EMERGENCY DEPARTMENT ULTRASOUND  Study: Limited Ultrasound of Bladder  INDICATIONS: to assess for urinary retention and/or bladder volume prior to urinary catheter Multiple views of  the bladder were obtained in real-time in the transverse and longitudinal planes with a multi-frequency probe.  PERFORMED BY: Myself IMAGES ARCHIVED?: Yes LIMITATIONS:  none INTERPRETATION: Minimal Volume and Volume Measurement 150        ____________________________________________   PROCEDURES  Procedure(s) performed:  Procedures    Critical Care performed: no ____________________________________________   INITIAL IMPRESSION / ASSESSMENT AND PLAN / ED COURSE  Pertinent labs & imaging results that were available during my care of the patient were reviewed by me and considered in my medical decision making (see chart for details).   DDX: cystitis, pyelo, prostatitis, retention  Alik Mawson is a 64 y.o. who presents to the ED with symptoms as described above.  Patient with complaints of dysuria following recent cystoscopy.  Bladder is fairly reassuring and has baseline is not septic appearing.  Bedside ultrasound does not show any evidence of acute urinary retention or clots in the bladder.  Clinical Course as of Jun 02 1512  Sun Jun 03, 2019  1301 Patient discussed in consultation with Dr. Glori Luis of urology.  We will plan fluids give dose of antibiotics here.  Based on his previous urine cultures will cover with Cipro.   [PR]    Clinical Course User Index [PR] Merlyn Lot, MD   Patient tolerating p.o.  He is emptying his bladder.  No signs of obstruction or retention.  Antibiotic sent to pharmacy.  Discussed case with Dr. Nehemiah Massed who agrees with appropriate to hold Plavix for 4 days while having bleeding.  Discussed signs symptoms for which he should return to the ER.  Have discussed with the patient and available family all diagnostics and treatments performed thus far and all questions were answered to the best of my ability. The patient demonstrates understanding and agreement with plan.   The patient was evaluated in Emergency Department today for the  symptoms described in the history of present illness. He/she was evaluated in the context of the global COVID-19 pandemic, which necessitated consideration that the patient might be at risk for infection with the SARS-CoV-2 virus that causes COVID-19. Institutional protocols and algorithms that  pertain to the evaluation of patients at risk for COVID-19 are in a state of rapid change based on information released by regulatory bodies including the CDC and federal and state organizations. These policies and algorithms were followed during the patient's care in the ED.  As part of my medical decision making, I reviewed the following data within the [place] notes reviewed and incorporated, Labs reviewed, notes from prior ED visits and [place] Controlled Substance Database   ____________________________________________   FINAL CLINICAL IMPRESSION(S) / ED DIAGNOSES  Final diagnoses:  Cystitis      NEW MEDICATIONS STARTED DURING THIS VISIT:  Discharge Medication List as of 06/03/2019  1:48 PM    START taking these medications   Details  ciprofloxacin (CIPRO) 250 MG tablet Take 1 tablet (250 mg total) by mouth 2 (two) times daily for 7 days., Starting Sun 06/03/2019, Until Sun 06/10/2019, Normal    Saccharomyces boulardii (PROBIOTIC) 250 MG CAPS Take 1 capsule by mouth in the morning and at bedtime., Starting Sun 06/03/2019, Normal         Note:  This document was prepared using Dragon voice recognition software and may include unintentional dictation errors.    Merlyn Lot, MD 06/03/19 [phone]

## 2019-06-05 LAB — URINE CULTURE: Culture: 30000 — AB

## 2019-07-11 ENCOUNTER — Ambulatory Visit: Payer: Medicaid Other | Admitting: Family Medicine

## 2019-08-08 ENCOUNTER — Telehealth: Payer: Self-pay | Admitting: Family Medicine

## 2019-08-10 ENCOUNTER — Ambulatory Visit: Payer: Medicaid Other | Admitting: Family Medicine

## 2019-08-28 ENCOUNTER — Other Ambulatory Visit: Payer: Self-pay | Admitting: Physician Assistant

## 2019-11-12 ENCOUNTER — Other Ambulatory Visit: Payer: Self-pay | Admitting: Physician Assistant

## 2019-11-13 ENCOUNTER — Other Ambulatory Visit: Payer: Self-pay

## 2019-11-13 DIAGNOSIS — Z8551 Personal history of malignant neoplasm of bladder: Secondary | ICD-10-CM

## 2019-11-14 ENCOUNTER — Other Ambulatory Visit: Payer: Medicaid Other

## 2019-11-16 ENCOUNTER — Other Ambulatory Visit: Payer: Medicaid Other | Admitting: Urology

## 2019-11-16 ENCOUNTER — Encounter: Payer: Self-pay | Admitting: Urology

## 2019-11-21 ENCOUNTER — Other Ambulatory Visit: Payer: Medicaid Other | Admitting: Urology

## 2019-12-26 ENCOUNTER — Telehealth: Payer: Self-pay | Admitting: *Deleted

## 2019-12-26 NOTE — Telephone Encounter (Signed)
[name] called c/o SOB upon waking in the morning. He is s/p CABG x4 12/27/2018 with Dr. Orvan Seen. I advised [name] to call his PCP as it has been a year since Dr. Orvan Seen has seen him. Pt states [facility]t. Pt advised if he feels the SOB is worsening he should go to an urgent care or ED. Pt understands. All questions answered.

## 2019-12-27 ENCOUNTER — Encounter: Payer: Self-pay | Admitting: Emergency Medicine

## 2019-12-27 ENCOUNTER — Emergency Department: Payer: Medicaid Other

## 2019-12-27 ENCOUNTER — Other Ambulatory Visit: Payer: Self-pay

## 2019-12-27 ENCOUNTER — Inpatient Hospital Stay
Admission: EM | Admit: 2019-12-27 | Discharge: 2020-01-01 | DRG: 673 | Disposition: A | Discharge status: Home / Self Care | Payer: Medicaid Other | Attending: Internal Medicine | Admitting: Internal Medicine

## 2019-12-27 DIAGNOSIS — N184 Chronic kidney disease, stage 4 (severe): Secondary | ICD-10-CM

## 2019-12-27 DIAGNOSIS — I10 Essential (primary) hypertension: Secondary | ICD-10-CM

## 2019-12-27 DIAGNOSIS — I509 Heart failure, unspecified: Secondary | ICD-10-CM

## 2019-12-27 DIAGNOSIS — N179 Acute kidney failure, unspecified: Principal | ICD-10-CM | POA: Diagnosis present

## 2019-12-27 DIAGNOSIS — Z8249 Family history of ischemic heart disease and other diseases of the circulatory system: Secondary | ICD-10-CM

## 2019-12-27 DIAGNOSIS — Z87442 Personal history of urinary calculi: Secondary | ICD-10-CM

## 2019-12-27 DIAGNOSIS — T447X6A Underdosing of beta-adrenoreceptor antagonists, initial encounter: Secondary | ICD-10-CM | POA: Diagnosis present

## 2019-12-27 DIAGNOSIS — F17210 Nicotine dependence, cigarettes, uncomplicated: Secondary | ICD-10-CM | POA: Diagnosis present

## 2019-12-27 DIAGNOSIS — E785 Hyperlipidemia, unspecified: Secondary | ICD-10-CM | POA: Diagnosis present

## 2019-12-27 DIAGNOSIS — D631 Anemia in chronic kidney disease: Secondary | ICD-10-CM | POA: Diagnosis present

## 2019-12-27 DIAGNOSIS — K668 Other specified disorders of peritoneum: Secondary | ICD-10-CM | POA: Diagnosis present

## 2019-12-27 DIAGNOSIS — I429 Cardiomyopathy, unspecified: Secondary | ICD-10-CM | POA: Diagnosis present

## 2019-12-27 DIAGNOSIS — I132 Hypertensive heart and chronic kidney disease with heart failure and with stage 5 chronic kidney disease, or end stage renal disease: Secondary | ICD-10-CM | POA: Diagnosis present

## 2019-12-27 DIAGNOSIS — K37 Unspecified appendicitis: Secondary | ICD-10-CM | POA: Diagnosis present

## 2019-12-27 DIAGNOSIS — Z951 Presence of aortocoronary bypass graft: Secondary | ICD-10-CM

## 2019-12-27 DIAGNOSIS — N1330 Unspecified hydronephrosis: Secondary | ICD-10-CM | POA: Diagnosis present

## 2019-12-27 DIAGNOSIS — Z79899 Other long term (current) drug therapy: Secondary | ICD-10-CM

## 2019-12-27 DIAGNOSIS — Z7902 Long term (current) use of antithrombotics/antiplatelets: Secondary | ICD-10-CM

## 2019-12-27 DIAGNOSIS — J449 Chronic obstructive pulmonary disease, unspecified: Secondary | ICD-10-CM | POA: Diagnosis present

## 2019-12-27 DIAGNOSIS — N186 End stage renal disease: Secondary | ICD-10-CM | POA: Diagnosis present

## 2019-12-27 DIAGNOSIS — I5023 Acute on chronic systolic (congestive) heart failure: Secondary | ICD-10-CM | POA: Diagnosis present

## 2019-12-27 DIAGNOSIS — Z20822 Contact with and (suspected) exposure to covid-19: Secondary | ICD-10-CM | POA: Diagnosis present

## 2019-12-27 DIAGNOSIS — Z825 Family history of asthma and other chronic lower respiratory diseases: Secondary | ICD-10-CM

## 2019-12-27 DIAGNOSIS — E872 Acidosis: Secondary | ICD-10-CM | POA: Diagnosis present

## 2019-12-27 DIAGNOSIS — N185 Chronic kidney disease, stage 5: Secondary | ICD-10-CM

## 2019-12-27 DIAGNOSIS — I252 Old myocardial infarction: Secondary | ICD-10-CM

## 2019-12-27 DIAGNOSIS — Z91138 Patient's unintentional underdosing of medication regimen for other reason: Secondary | ICD-10-CM

## 2019-12-27 DIAGNOSIS — I5022 Chronic systolic (congestive) heart failure: Secondary | ICD-10-CM

## 2019-12-27 DIAGNOSIS — I160 Hypertensive urgency: Secondary | ICD-10-CM | POA: Diagnosis present

## 2019-12-27 DIAGNOSIS — Z7982 Long term (current) use of aspirin: Secondary | ICD-10-CM

## 2019-12-27 DIAGNOSIS — I5042 Chronic combined systolic (congestive) and diastolic (congestive) heart failure: Secondary | ICD-10-CM

## 2019-12-27 DIAGNOSIS — Z8551 Personal history of malignant neoplasm of bladder: Secondary | ICD-10-CM

## 2019-12-27 DIAGNOSIS — I2510 Atherosclerotic heart disease of native coronary artery without angina pectoris: Secondary | ICD-10-CM | POA: Diagnosis present

## 2019-12-27 DIAGNOSIS — T461X6A Underdosing of calcium-channel blockers, initial encounter: Secondary | ICD-10-CM | POA: Diagnosis present

## 2019-12-27 DIAGNOSIS — Z992 Dependence on renal dialysis: Secondary | ICD-10-CM

## 2019-12-27 LAB — HIV ANTIBODY (ROUTINE TESTING W REFLEX): HIV Screen 4th Generation wRfx: NONREACTIVE

## 2019-12-27 LAB — HEMOGLOBIN A1C
Hgb A1c MFr Bld: 5.6 % (ref 4.8–5.6)
Mean Plasma Glucose: 114.02 mg/dL

## 2019-12-27 LAB — COMPREHENSIVE METABOLIC PANEL
ALT: 29 U/L (ref 0–44)
AST: 30 U/L (ref 15–41)
Albumin: 3.4 g/dL — ABNORMAL LOW (ref 3.5–5.0)
Alkaline Phosphatase: 56 U/L (ref 38–126)
Anion gap: 14 (ref 5–15)
BUN: 90 mg/dL — ABNORMAL HIGH (ref 8–23)
CO2: 18 mmol/L — ABNORMAL LOW (ref 22–32)
Calcium: 5.3 mg/dL — CL (ref 8.9–10.3)
Chloride: 109 mmol/L (ref 98–111)
Creatinine, Ser: 6.35 mg/dL — ABNORMAL HIGH (ref 0.61–1.24)
GFR, Estimated: 9 mL/min — ABNORMAL LOW (ref >60)
Glucose, Bld: 101 mg/dL — ABNORMAL HIGH (ref 70–99)
Potassium: 4.7 mmol/L (ref 3.5–5.1)
Sodium: 141 mmol/L (ref 135–145)
Total Bilirubin: 0.7 mg/dL (ref 0.3–1.2)
Total Protein: 6.4 g/dL — ABNORMAL LOW (ref 6.5–8.1)

## 2019-12-27 LAB — CBC WITH DIFFERENTIAL/PLATELET
Abs Immature Granulocytes: 0.03 10*3/uL (ref 0.00–0.07)
Basophils Absolute: 0 10*3/uL (ref 0.0–0.1)
Basophils Relative: 0 %
Eosinophils Absolute: 0.3 10*3/uL (ref 0.0–0.5)
Eosinophils Relative: 3 %
HCT: 30 % — ABNORMAL LOW (ref 39.0–52.0)
Hemoglobin: 9.7 g/dL — ABNORMAL LOW (ref 13.0–17.0)
Immature Granulocytes: 0 %
Lymphocytes Relative: 9 %
Lymphs Abs: 0.8 10*3/uL (ref 0.7–4.0)
MCH: 28.4 pg (ref 26.0–34.0)
MCHC: 32.3 g/dL (ref 30.0–36.0)
MCV: 88 fL (ref 80.0–100.0)
Monocytes Absolute: 0.7 10*3/uL (ref 0.1–1.0)
Monocytes Relative: 8 %
Neutro Abs: 6.9 10*3/uL (ref 1.7–7.7)
Neutrophils Relative %: 80 %
Platelets: 232 10*3/uL (ref 150–400)
RBC: 3.41 MIL/uL — ABNORMAL LOW (ref 4.22–5.81)
RDW: 17 % — ABNORMAL HIGH (ref 11.5–15.5)
WBC: 8.7 10*3/uL (ref 4.0–10.5)
nRBC: 0 % (ref 0.0–0.2)

## 2019-12-27 LAB — IRON AND TIBC
Iron: 39 ug/dL — ABNORMAL LOW (ref 45–182)
Saturation Ratios: 18 % (ref 17.9–39.5)
TIBC: 221 ug/dL — ABNORMAL LOW (ref 250–450)
UIBC: 182 ug/dL

## 2019-12-27 LAB — MAGNESIUM: Magnesium: 2.1 mg/dL (ref 1.7–2.4)

## 2019-12-27 LAB — FERRITIN: Ferritin: 45 ng/mL (ref 24–336)

## 2019-12-27 LAB — URINALYSIS, ROUTINE W REFLEX MICROSCOPIC
Bacteria, UA: NONE SEEN
Bilirubin Urine: NEGATIVE
Glucose, UA: NEGATIVE mg/dL
Hgb urine dipstick: NEGATIVE
Ketones, ur: NEGATIVE mg/dL
Leukocytes,Ua: NEGATIVE
Nitrite: NEGATIVE
Protein, ur: >=300 mg/dL — AB
Specific Gravity, Urine: 1.01 (ref 1.005–1.030)
Squamous Epithelial / HPF: NONE SEEN (ref 0–5)
pH: 5 (ref 5.0–8.0)

## 2019-12-27 LAB — TSH: TSH: 5.156 u[IU]/mL — ABNORMAL HIGH (ref 0.350–4.500)

## 2019-12-27 LAB — TROPONIN I (HIGH SENSITIVITY)
Troponin I (High Sensitivity): 100 ng/L (ref <18)
Troponin I (High Sensitivity): 98 ng/L — ABNORMAL HIGH (ref <18)

## 2019-12-27 LAB — BRAIN NATRIURETIC PEPTIDE: B Natriuretic Peptide: 4155.4 pg/mL — ABNORMAL HIGH (ref 0.0–100.0)

## 2019-12-27 LAB — RESPIRATORY PANEL BY RT PCR (FLU A&B, COVID)
Influenza A by PCR: NEGATIVE
Influenza B by PCR: NEGATIVE
SARS Coronavirus 2 by RT PCR: NEGATIVE

## 2019-12-27 MED ORDER — CALCIUM ACETATE (PHOS BINDER) 667 MG PO CAPS
1334.0000 mg | ORAL_CAPSULE | Freq: Three times a day (TID) | ORAL | Status: DC
  Administered 2019-12-28 – 2020-01-01 (×6): 1334 mg via ORAL
  Filled 2019-12-27 (×15): qty 2

## 2019-12-27 MED ORDER — AMLODIPINE BESYLATE 5 MG PO TABS
5.0000 mg | ORAL_TABLET | Freq: Every day | ORAL | Status: DC
  Administered 2019-12-27 – 2019-12-28 (×2): 5 mg via ORAL
  Filled 2019-12-27 (×2): qty 1

## 2019-12-27 MED ORDER — FUROSEMIDE 10 MG/ML IJ SOLN
80.0000 mg | Freq: Two times a day (BID) | INTRAMUSCULAR | Status: DC
  Administered 2019-12-27 – 2019-12-28 (×2): 80 mg via INTRAVENOUS
  Filled 2019-12-27 (×2): qty 8

## 2019-12-27 MED ORDER — ATORVASTATIN CALCIUM 20 MG PO TABS
40.0000 mg | ORAL_TABLET | Freq: Every day | ORAL | Status: DC
  Administered 2019-12-27 – 2019-12-28 (×2): 40 mg via ORAL
  Filled 2019-12-27 (×2): qty 2

## 2019-12-27 MED ORDER — CALCIUM GLUCONATE-NACL 2-0.675 GM/100ML-% IV SOLN
2.0000 g | Freq: Once | INTRAVENOUS | Status: AC
  Administered 2019-12-27: 2000 mg via INTRAVENOUS
  Filled 2019-12-27: qty 100

## 2019-12-27 MED ORDER — METOPROLOL SUCCINATE ER 25 MG PO TB24
25.0000 mg | ORAL_TABLET | Freq: Every day | ORAL | Status: DC
  Administered 2019-12-27 – 2019-12-28 (×2): 25 mg via ORAL
  Filled 2019-12-27 (×2): qty 1

## 2019-12-27 MED ORDER — ASPIRIN EC 81 MG PO TBEC
81.0000 mg | DELAYED_RELEASE_TABLET | Freq: Every day | ORAL | Status: DC
  Administered 2019-12-27 – 2019-12-28 (×2): 81 mg via ORAL
  Filled 2019-12-27 (×2): qty 1

## 2019-12-27 MED ORDER — HEPARIN SODIUM (PORCINE) 5000 UNIT/ML IJ SOLN
5000.0000 [IU] | Freq: Three times a day (TID) | INTRAMUSCULAR | Status: DC
  Administered 2019-12-27 – 2019-12-31 (×9): 5000 [IU] via SUBCUTANEOUS
  Filled 2019-12-27 (×10): qty 1

## 2019-12-27 MED ORDER — SODIUM CHLORIDE 0.9 % IV SOLN
1.5000 mg/kg/h | INTRAVENOUS | Status: DC
  Filled 2019-12-27: qty 100

## 2019-12-27 NOTE — ED Notes (Signed)
Patient to US at this time.

## 2019-12-27 NOTE — ED Triage Notes (Addendum)
C/O blood pressure being high today when checking into work.  Patient states he has BP medications but "sometimes forgets to take it".  Patient does not know name of medication.  States he takes two types.  Has not had medication x 2-3 weeks.  Patient has appointment scheduled for early December with new PCP.  AAOx3.  Skin warm and dry. NAD

## 2019-12-27 NOTE — H&P (Addendum)
History and Physical   Safal Halderman INO:[id number] DOB: January 14, 1956 DOA: 12/27/2019  PCP: Volney American, PA-C  Patient coming from: Place of work  I have personally briefly reviewed patient's old medical records in [place].  Chief Concern: Blood pressure too high at work  HPI: Anthony Hess is a 64 y.o. male with medical history significant for complicated urologic history, hypertension with history of medication noncompliance, CKD 4, anemia of chronic disease, heart failure reduced ejection fraction presented to the ED at the urging of his employer for high blood pressure to the 096G systolic.  He endorses 3 days of shortness of breath.  He endorses unchanged cough.  Review of system was negative for headache, vision changes, dysphagia, abdominal pain, nausea, vomiting, chest pain, difficulty ambulation, diarrhea, dysuria, hematuria.  He endorses previous history of hematuria.  Social history: works in [employer], smokes 1/2 ppd, denies etoh and recreational drug use  ED Course: Discussed with ED provider.  Admit for sepsis-like presentation.  Review of Systems: As per HPI otherwise 10 point review of systems negative.   Past Medical History:  Diagnosis Date   Bladder tumor    Cancer [facility])    Bladder   Chronic kidney disease    renal insufficiency   Coronary artery disease    History of kidney stones    Hypertension    Myocardial infarction [facility])    Wears glasses    Past Surgical History:  Procedure Laterality Date   CORONARY ARTERY BYPASS GRAFT N/A 12/27/2018   Procedure: CORONARY ARTERY BYPASS GRAFTING (CABG) X 4 ON PUMP USING RIGHT & LEFT INTERNAL MAMMARY ARTERY LEFT RADIAL ARTERY ENDOSCOPICALLY HARVESTED;  Surgeon: [name], MD;  Location: [place];  Service: Open Heart Surgery;  Laterality: N/A;   CYSTOSCOPY W/ RETROGRADES Bilateral 05/15/2019   Procedure: CYSTOSCOPY WITH RETROGRADE PYELOGRAM;  Surgeon: Abbie Sons, MD;  Location: [facility];  Service: Urology;  Laterality: Bilateral;   CYSTOSCOPY WITH BIOPSY N/A 05/15/2019   Procedure: CYSTOSCOPY WITH bladder BIOPSY;  Surgeon: Abbie Sons, MD;  Location: [facility];  Service: Urology;  Laterality: N/A;   LEFT HEART CATH AND CORONARY ANGIOGRAPHY Left 12/20/2018   Procedure: LEFT HEART CATH AND CORONARY ANGIOGRAPHY;  Surgeon: Isaias [place]man, MD;  Location: [place] CV LAB;  Service: Cardiovascular;  Laterality: Left;   RADIAL ARTERY HARVEST Left 12/27/2018   Procedure: ENDOSCOPIC RADIAL ARTERY HARVEST;  Surgeon: [name], MD;  Location: [place];  Service: Open Heart Surgery;  Laterality: Left;   TEE WITHOUT CARDIOVERSION N/A 12/27/2018   Procedure: TRANSESOPHAGEAL ECHOCARDIOGRAM (TEE);  Surgeon: [name], MD;  Location: [place];  Service: Open Heart Surgery;  Laterality: N/A;   TUMOR REMOVAL  2019   Bladder   Social History:  reports that he has been smoking cigarettes. He has been smoking about 0.25 packs per day. He has never used smokeless tobacco. He reports that he does not drink alcohol and does not use drugs.  No Known Allergies  Family History  Family history unknown: Yes   Family history: Family history reviewed and no known family history of bladder cancer.  Prior to Admission medications   Medication Sig Start Date End Date Taking? Authorizing Provider  amLODipine (NORVASC) 5 MG tablet Take 1 tablet (5 mg total) by mouth daily. 05/31/19   Volney American, PA-C  aspirin EC 81 MG tablet Take 81 mg by mouth daily.    [name], MD  atorvastatin (LIPITOR) 40  MG tablet Take 1 tablet (40 mg total) by mouth daily at 6 PM. Patient taking differently: Take 40 mg by mouth daily.  01/05/19   Antony Odea, PA-C  clopidogrel (PLAVIX) 75 MG tablet Take 1 tablet (75 mg total) by mouth daily. 01/06/19   Antony Odea, PA-C  metoprolol succinate (TOPROL-XL) 25 MG 24 hr tablet Take 1 tablet (25 mg total) by mouth daily.  02/21/19   Volney American, PA-C  Saccharomyces boulardii (PROBIOTIC) 250 MG CAPS Take 1 capsule by mouth in the morning and at bedtime. 06/03/19   Merlyn Lot, MD  traMADol (ULTRAM) 50 MG tablet Take 50 mg by mouth 2 (two) times daily as needed (pain.).    [provider]   Physical Exam: Vitals:   12/27/19 1059 12/27/19 1102 12/27/19 1245  BP:  (!) 166/114 (!) 184/116  Pulse:  86 80  Resp:  16 18  Temp:  97.7 F (36.5 C)   TempSrc:  Oral   SpO2:  97% 93%  Weight: 63.5 kg    Height: 5\' 2"  (1.575 m)     Constitutional: NAD, calm, comfortable Eyes: PERRL, lids and conjunctivae normal ENMT: Mucous membranes are moist. Posterior pharynx clear of any exudate or lesions. poor dentition.  Neck: normal, supple, no masses, no thyromegaly Respiratory: clear to auscultation bilaterally, no wheezing, no crackles. Normal respiratory effort. No accessory muscle use.  Cardiovascular: Regular rate and rhythm, no murmurs / rubs / gallops.  1+ pitting lower extremity edema. 2+ pedal pulses. No carotid bruits.  Abdomen: no tenderness, no masses palpated. No hepatosplenomegaly. Bowel sounds positive.  Musculoskeletal: no clubbing / cyanosis. No joint deformity upper and lower extremities. Good ROM, no contractures. Normal muscle tone.  Skin: no rashes, lesions, ulcers. No induration Neurologic: CN 2-12 grossly intact. Sensation intact. Strength 5/5 in all 4.  Psychiatric:  Alert and oriented x 3.  Poor insight into personal health  Labs on Admission: I have personally reviewed following labs and imaging studies  CBC: Recent Labs  Lab 12/27/19 1108  WBC 8.7  NEUTROABS 6.9  HGB 9.7*  HCT 30.0*  MCV 88.0  PLT 809   Basic Metabolic Panel: Recent Labs  Lab 12/27/19 1108  NA 141  K 4.7  CL 109  CO2 18*  GLUCOSE 101*  BUN 90*  CREATININE 6.35*  CALCIUM 5.3*   GFR: Estimated Creatinine Clearance: 9.4 mL/min (A) (by C-G formula based on SCr of 6.35 mg/dL (H)). Liver  Function Tests: Recent Labs  Lab 12/27/19 1108  AST 30  ALT 29  ALKPHOS 56  BILITOT 0.7  PROT 6.4*  ALBUMIN 3.4*   Urine analysis:    Component Value Date/Time   COLORURINE YELLOW (A) 06/03/2019 1036   APPEARANCEUR TURBID (A) 06/03/2019 1036   APPEARANCEUR Cloudy (A) 05/08/2019 1448   LABSPEC 1.017 06/03/2019 1036   PHURINE 6.0 06/03/2019 1036   GLUCOSEU NEGATIVE 06/03/2019 1036   HGBUR MODERATE (A) 06/03/2019 1036   BILIRUBINUR NEGATIVE 06/03/2019 1036   BILIRUBINUR Negative 05/08/2019 1448   KETONESUR NEGATIVE 06/03/2019 1036   PROTEINUR 100 (A) 06/03/2019 1036   NITRITE NEGATIVE 06/03/2019 1036   LEUKOCYTESUR MODERATE (A) 06/03/2019 1036   Chest x-ray on Admission: Personally reviewed and I agree with radiologist reading as below. And presence of sternotomy wires  DG Chest 2 View  Result Date: 12/27/2019 CLINICAL DATA:  Hypertensive urgency EXAM: CHEST - 2 VIEW COMPARISON:  01/22/2019 FINDINGS: Increased interstitial prominence. No pleural effusion. No pneumothorax. Stable cardiomediastinal contours  no acute osseous abnormality. IMPRESSION: Increased interstitial prominence, which may reflect mild edema. Electronically Signed   By: Macy Mis M.D.   On: 12/27/2019 12:05   US Renal  Result Date: 12/27/2019 CLINICAL DATA:  Renal failure. EXAM: RENAL / URINARY TRACT ULTRASOUND COMPLETE COMPARISON:  Abdomen and pelvis CT dated 04/03/2018. Renal ultrasound dated 12/30/2018. FINDINGS: Right Kidney: Renal measurements: 9.58.2 x 5.7 cm = volume: 233 mL. Markedly diffusely echogenic with marked diffuse parenchymal thinning. Interval marked right collecting system dilatation. Left Kidney: Renal measurements: 10.7 x 5.5 x 5.5 cm = volume: 168 mL. Diffusely echogenic with poorly defined margins. Interval marked dilatation of the collecting system. Bladder: Mildly diffusely irregular walls with mild floating internal debris. Calculated prevoid volume of 411 cc and postvoid volume of 161  cc. Other: None. IMPRESSION: 1. Interval marked bilateral hydronephrosis. 2. Markedly echogenic kidneys compatible with marked chronic medical renal disease. 3. Marked diffuse right renal parenchymal atrophy and no significant left renal parenchymal atrophy. 4. Irregular bladder with a moderate-sized postvoid residual. Electronically Signed   By: Claudie Revering M.D.   On: 12/27/2019 14:11   EKG: Independently reviewed, showing normal sinus rhythm with rate of 82, no ST elevation  Assessment/Plan  Active Problems:   AKI (acute kidney injury) ([place])   Bilateral hydronephrosis and history of bladder cancer status post transurethral resection -Interval marked bilateral hydronephrosis, irregular bladder with moderate sized postvoid residual -Urology has been consulted  AKI -  - Nephrology has been consulted - Echo ordered for possible cardiorenal syndrome versus poor blood pressure control chronically -Ultrasound of the renals have been ordered  Hypocalcemia-suspect secondary to AKI -Asymptomatic at this time -Calcium gluconate has been ordered -Pharmacy has been consulted for calcium gluconate infusion management -Nephrology has been consulted and recommends check PTH, PTH related peptide, vitamin D, phosphorus -Recommends start calcium acetate oral 3 times daily with meals  Elevated troponin - echo,  - Echo ordered - Trend HS Troponin - Patient denies chest pain  Hypertension-amlodipine 5 mg daily, atorvastatin 40 mg  Possible acute on chronic heart failure-echo -Elevated BNP -Lasix trial 80 mg IV twice daily  CAD-low clinical suspicion for ACS at this time -No chest pain no EKG changes -Troponin negative delta -Resumed atorvastatin 40 mg, metoprolol succinate 25 daily, aspirin  DVT prophylaxis: Heparin Code Status: Full code Diet: Cardiac Family Communication: Spouse is aware, patient will update spouse Disposition Plan: Pending clinical course Consults called: Nephrology and  urology Admission status: Observation  Keelia Graybill N Ryon Layton D.O. [facility]  If 7AM-7PM, please contact day-coverage provider [email or web address]  12/27/2019, 2:23 PM

## 2019-12-27 NOTE — Consult Note (Signed)
Urology Consult   I have been asked to see the patient by Dr. Tobie Poet, for evaluation and management of chronic bilateral hydroureteronephrosis and AKI with history of bladder cancer  Chief Complaint: High blood pressure  HPI:  Anthony Hess is a 64 y.o. year old male regularly followed by Dr. Bernardo Heater in urology who has a complex urologic history.  He is asymptomatic today, but was sent to the ED because of hypertension at work.  Blood pressure on admission to ED was 180/115.  Baseline renal function is poor with creatinine of~3.3, eGFR 21.   To briefly summarize his urologic history, he was hospitalized in December 2018 in [place] for hematuria and AKI with a creatinine of 6.9 and CT showing severe bilateral hydronephrosis and hyperdense material in the bladder with 1 year of gross hematuria and voiding problems.  He underwent bilateral percutaneous nephrostomy tube placement on 02/07/2017, followed by cystoscopy with clot evacuation and TURBT on 02/09/2017.  At that time intraoperative findings showed extensive papillary tumor greater than 5 cm occupying the trigone and obscuring both ureteral orifices, and the tumor was reportedly incompletely resected.  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  He then underwent a repeat TURBT on 03/15/2017 with extensive tumor over the right lateral wall and dome which again showed low-grade noninvasive urothelial cell carcinoma, muscle was present with no evidence of involvement.  His nephrostomy tubes were converted to bilateral ureteral stents on 04/06/2017.  He underwent an additional TURBT on 04/25/2017 with 4 cm of papillary tumor at the posterior bladder wall that was resected, and again was low-grade noninvasive disease.  His ureteral stents were removed on 05/02/2017.  BCG was recommended, but he did not follow-up and ultimately moved to [facility].  He underwent CABG in [place] on 12/27/2018.  He ultimately underwent  cystoscopy under anesthesia and bilateral retrograde pyelograms with Dr. Bernardo Heater on 05/15/2019.  There were no bladder tumors noted, and biopsy of the bladder showed no evidence of malignancy or recurrent bladder cancer.  Retrograde pyelograms bilaterally showed chronic severe hydroureteronephrosis, but contrast drained promptly from both kidneys.  Both ureters were easily identified and patulous bilaterally.  He denies any gross hematuria, flank pain, or urinary complaints.  Renal ultrasound on admission shows severe bilateral hydroureteronephrosis.  Labs show acute on chronic renal failure with creatinine of 6.35 from 3.3 prior.  Other labs notable for BNP of 4000 155, troponin of 100, WBC 8.7, potassium normal at 4.7, and low calcium of 5.3.  He has not been compliant with his blood pressure medications over the last few weeks.  PMH: Past Medical History:  Diagnosis Date  . Bladder tumor   . Cancer [facility])    Bladder  . Chronic kidney disease    renal insufficiency  . Coronary artery disease   . History of kidney stones   . Hypertension   . Myocardial infarction ([place])   . Wears glasses     Surgical History: Past Surgical History:  Procedure Laterality Date  . CORONARY ARTERY BYPASS GRAFT N/A 12/27/2018   Procedure: CORONARY ARTERY BYPASS GRAFTING (CABG) X 4 ON PUMP USING RIGHT & LEFT INTERNAL MAMMARY ARTERY LEFT RADIAL ARTERY ENDOSCOPICALLY HARVESTED;  Surgeon: [name], MD;  Location: [place];  Service: Open Heart Surgery;  Laterality: N/A;  . CYSTOSCOPY W/ RETROGRADES Bilateral 05/15/2019   Procedure: CYSTOSCOPY WITH RETROGRADE PYELOGRAM;  Surgeon: Abbie Sons, MD;  Location: [facility];  Service: Urology;  Laterality: Bilateral;  .  CYSTOSCOPY WITH BIOPSY N/A 05/15/2019   Procedure: CYSTOSCOPY WITH bladder BIOPSY;  Surgeon: Abbie Sons, MD;  Location: [facility];  Service: Urology;  Laterality: N/A;  . LEFT HEART CATH AND CORONARY ANGIOGRAPHY Left 12/20/2018   Procedure: LEFT  HEART CATH AND CORONARY ANGIOGRAPHY;  Surgeon: Isaias Cowman, MD;  Location: [place] [facility];  Service: Cardiovascular;  Laterality: Left;  . RADIAL ARTERY HARVEST Left 12/27/2018   Procedure: ENDOSCOPIC RADIAL ARTERY HARVEST;  Surgeon: [name], MD;  Location: [place];  Service: Open Heart Surgery;  Laterality: Left;  . TEE WITHOUT CARDIOVERSION N/A 12/27/2018   Procedure: TRANSESOPHAGEAL ECHOCARDIOGRAM (TEE);  Surgeon: [name], MD;  Location: [place];  Service: Open Heart Surgery;  Laterality: N/A;  . TUMOR REMOVAL  2019   Bladder    Allergies: No Known Allergies  Family History: Family History  Family history unknown: Yes    Social History:  reports that he has been smoking cigarettes. He has been smoking about 0.25 packs per day. He has never used smokeless tobacco. He reports that he does not drink alcohol and does not use drugs.  ROS: Negative aside from those stated in the HPI.  Physical Exam: BP (!) 158/99 (BP Location: Right Arm)   Pulse 79   Temp 97.7 F (36.5 C) (Oral)   Resp 18   Ht _0  (1.575 m)   Wt 63.5 kg   SpO2 94%   BMI 25.60 kg/m    Constitutional:  Alert and oriented, No acute distress. Cardiovascular: No clubbing, cyanosis, or edema. Respiratory: Normal respiratory effort, no increased work of breathing. GI: Abdomen is soft, nontender, nondistended, no abdominal masses GU: No CVA tenderness  Laboratory Data: Reviewed, see HPI  Pertinent Imaging: I have personally reviewed the prior CT, as well as the recent renal ultrasound showing chronic severe bilateral hydroureteronephrosis  Assessment & Plan:   Briefly, he is a complex 64 year old male with a history of chronic bilateral severe hydroureteronephrosis of unclear etiology(bladder cancer, BPH/outlet obstruction, fibrotic bladder, scar tissue from prior resections, poorly compliant bladder) and CKD with baseline creatinine of 3.3(eGFR 20) who was sent from his work with  hypertension and is completely asymptomatic today.  His last cystoscopy was with Dr. Bernardo Heater in March 2021 and showed no evidence of recurrent bladder cancer, and chronic severe bilateral hydroureteronephrosis with collecting systems that drained well with no clear evidence of obstruction.  We discussed possible etiologies of his worsening renal function including poorly controlled hypertension and dehydration, and my low suspicion for acute obstruction as an etiology of his AKI.  He is not a candidate for nephrostomy tubes on anticoagulation, and I have a very high concern for placing ureteral stents with his poor compliance for following up with medical care in the past, as he would be high risk for developing recurrent UTIs and encrusted stents bilaterally.  Additionally, I also think there is a low likelihood of significant improvement in renal function with ureteral stent placement or nephrostomy tube placement.  -Recommend controlling blood pressure, hydration, trend renal function.  Urology will continue to follow along.  Unfortunately, I think he is at high risk for progressing to ESRD and ultimately requiring dialysis.  -No role for acute intervention with ureteral stents or nephrostomy tubes in the setting of chronic bilateral severe hydroureteronephrosis, and recent evaluation of both collecting systems in March 2021 with prompt drainage of contrast  -Case discussed with Dr. Juleen China of nephrology who is in agreement  Billey Co,  MD  Total time spent on the floor was 80 minutes, with greater than 50% spent in counseling and coordination of care with the patient regarding history of bladder cancer, chronic bilateral hydroureteronephrosis, progressing renal failure, and treatment options.  [facility] [address], [place], [place] [address] [phone]

## 2019-12-27 NOTE — ED Notes (Signed)
Patient at xray

## 2019-12-27 NOTE — ED Notes (Signed)
Patient comes in with complaints for high blood pressure after not taking BP meds for the past few weeks. Denies headaches or additional symptoms with the high blood pressure.

## 2019-12-27 NOTE — ED Notes (Signed)
Date and time results received: 12/27/19 1214 (use smartphrase ".now" to insert current time)  Test: Calcium Critical Value: 5.3  Name of Provider Notified: Quentin Cornwall  Orders Received? Or Actions Taken?: Orders Received - See Orders for details

## 2019-12-27 NOTE — Progress Notes (Signed)
[facility]  ROUNDING NOTE   Subjective:   [name] was admitted to [facility] on 12/27/2019 for AKI (acute kidney injury) [facility]) [N17.9]  Patient was last seen by nephrology on 03/2018 where his creatinine was 3.02, GFR of 21.   Today, patient presents with a serum creatinine of 8.46 with metabolic acidosis, hypocalcemia.   Found to have bilateral hydronephrosis on renal ultrasound. Patient has a history of bladder cancer.  Last cystoscopy by Dr. Bernardo Heater on 05/15/19 where patient was found to have bilateral hydronephrosis that seemed chronic with no acute obstruction.    Objective:  Vital signs in last 24 hours:  Temp:  [97.7 F (36.5 C)] 97.7 F (36.5 C) (11/04 1102) Pulse Rate:  [80-86] 80 (11/04 1245) Resp:  [16-18] 18 (11/04 1245) BP: (155-184)/(101-116) 155/101 (11/04 1547) SpO2:  [93 %-97 %] 93 % (11/04 1245) Weight:  [63.5 kg] 63.5 kg (11/04 1059)  Weight change:  Filed Weights   12/27/19 1059  Weight: 63.5 kg    Intake/Output: No intake/output data recorded.   Intake/Output this shift:  No intake/output data recorded.  Physical Exam: General: NAD,   Head: Normocephalic, atraumatic. Moist oral mucosal membranes  Eyes: Anicteric, PERRL  Neck: Supple, trachea midline  Lungs:  Clear to auscultation  Heart: Regular rate and rhythm  Abdomen:  Soft, nontender,   Extremities: no peripheral edema.  Neurologic: Nonfocal, moving all four extremities  Skin: No lesions        Basic Metabolic Panel: Recent Labs  Lab 12/27/19 1108  NA 141  K 4.7  CL 109  CO2 18*  GLUCOSE 101*  BUN 90*  CREATININE 6.35*  CALCIUM 5.3*    Liver Function Tests: Recent Labs  Lab 12/27/19 1108  AST 30  ALT 29  ALKPHOS 56  BILITOT 0.7  PROT 6.4*  ALBUMIN 3.4*   No results for input(s): LIPASE, AMYLASE in the last 168 hours. No results for input(s): AMMONIA in the last 168 hours.  CBC: Recent Labs  Lab 12/27/19 1108  WBC 8.7  NEUTROABS 6.9  HGB 9.7*   HCT 30.0*  MCV 88.0  PLT 232    Cardiac Enzymes: No results for input(s): CKTOTAL, CKMB, CKMBINDEX, TROPONINI in the last 168 hours.  BNP: Invalid input(s): POCBNP  CBG: No results for input(s): GLUCAP in the last 168 hours.  Microbiology: Results for orders placed or performed during the hospital encounter of 12/27/19  Respiratory Panel by RT PCR (Flu A&B, Covid) - Nasopharyngeal Swab     Status: None   Collection Time: 12/27/19 12:29 PM   Specimen: Nasopharyngeal Swab  Result Value Ref Range Status   SARS Coronavirus 2 by RT PCR NEGATIVE NEGATIVE Final    Comment: (NOTE) SARS-CoV-2 target nucleic acids are NOT DETECTED.  The SARS-CoV-2 RNA is generally detectable in upper respiratoy specimens during the acute phase of infection. The lowest concentration of SARS-CoV-2 viral copies this assay can detect is 131 copies/mL. A negative result does not preclude SARS-Cov-2 infection and should not be used as the sole basis for treatment or other patient management decisions. A negative result may occur with  improper specimen collection/handling, submission of specimen other than nasopharyngeal swab, presence of viral mutation(s) within the areas targeted by this assay, and inadequate number of viral copies (<131 copies/mL). A negative result must be combined with clinical observations, patient history, and epidemiological information. The expected result is Negative.  Fact Sheet for Patients:  PinkCheek.be  Fact Sheet for Healthcare Providers:  GravelBags.it  This test is no t yet approved or cleared by the [place] and  has been authorized for detection and/or diagnosis of SARS-CoV-2 by FDA under an Emergency Use Authorization (EUA). This EUA will remain  in effect (meaning this test can be used) for the duration of the COVID-19 declaration under Section 564(b)(1) of the Act, 21 U.S.C. section  360bbb-3(b)(1), unless the authorization is terminated or revoked sooner.     Influenza A by PCR NEGATIVE NEGATIVE Final   Influenza B by PCR NEGATIVE NEGATIVE Final    Comment: (NOTE) The Xpert Xpress SARS-CoV-2/FLU/RSV assay is intended as an aid in  the diagnosis of influenza from Nasopharyngeal swab specimens and  should not be used as a sole basis for treatment. Nasal washings and  aspirates are unacceptable for Xpert Xpress SARS-CoV-2/FLU/RSV  testing.  Fact Sheet for Patients: PinkCheek.be  Fact Sheet for Healthcare Providers: GravelBags.it  This test is not yet approved or cleared by the [place] FDA and  has been authorized for detection and/or diagnosis of SARS-CoV-2 by  FDA under an Emergency Use Authorization (EUA). This EUA will remain  in effect (meaning this test can be used) for the duration of the  Covid-19 declaration under Section 564(b)(1) of the Act, 21  U.S.C. section 360bbb-3(b)(1), unless the authorization is  terminated or revoked. Performed at [facility], [address] [place], [place] [address]     Coagulation Studies: No results for input(s): LABPROT, INR in the last 72 hours.  Urinalysis: No results for input(s): COLORURINE, LABSPEC, PHURINE, GLUCOSEU, HGBUR, BILIRUBINUR, KETONESUR, PROTEINUR, UROBILINOGEN, NITRITE, LEUKOCYTESUR in the last 72 hours.  Invalid input(s): APPERANCEUR    Imaging: DG Chest 2 View  Result Date: 12/27/2019 CLINICAL DATA:  Hypertensive urgency EXAM: CHEST - 2 VIEW COMPARISON:  01/22/2019 FINDINGS: Increased interstitial prominence. No pleural effusion. No pneumothorax. Stable cardiomediastinal contours no acute osseous abnormality. IMPRESSION: Increased interstitial prominence, which may reflect mild edema. Electronically Signed   By: Macy Mis M.D.   On: 12/27/2019 12:05   US Renal  Result Date: 12/27/2019 CLINICAL DATA:  Renal  failure. EXAM: RENAL / URINARY TRACT ULTRASOUND COMPLETE COMPARISON:  Abdomen and pelvis CT dated 04/03/2018. Renal ultrasound dated 12/30/2018. FINDINGS: Right Kidney: Renal measurements: 9.58.2 x 5.7 cm = volume: 233 mL. Markedly diffusely echogenic with marked diffuse parenchymal thinning. Interval marked right collecting system dilatation. Left Kidney: Renal measurements: 10.7 x 5.5 x 5.5 cm = volume: 168 mL. Diffusely echogenic with poorly defined margins. Interval marked dilatation of the collecting system. Bladder: Mildly diffusely irregular walls with mild floating internal debris. Calculated prevoid volume of 411 cc and postvoid volume of 161 cc. Other: None. IMPRESSION: 1. Interval marked bilateral hydronephrosis. 2. Markedly echogenic kidneys compatible with marked chronic medical renal disease. 3. Marked diffuse right renal parenchymal atrophy and no significant left renal parenchymal atrophy. 4. Irregular bladder with a moderate-sized postvoid residual. Electronically Signed   By: Claudie Revering M.D.   On: 12/27/2019 14:11     Medications:    calcium gluconate      amLODipine  5 mg Oral Daily   aspirin EC  81 mg Oral Daily   atorvastatin  40 mg Oral Daily   heparin  5,000 Units Subcutaneous Q8H   lidocaine  1 application Urethral Once   metoprolol succinate  25 mg Oral Daily     Assessment/ Plan:  [name] is a 64 y.o. white male with hypertension, coronary artery disease, nephrolithiasis, history of bladder  cancer who is admitted to [facility] on Jan 22, 2020 for AKI (acute kidney injury) ([place]) [N17.9]  1. Acute kidney injury with metabolic acidosis on chronic kidney disease stage IV: baseline creatinine of 3.26, GFR of 19 on 06/03/2019.  Acute kidney injury seems secondary to obstructive uropathy versus progression of chronic kidney disease.  Discussed case with Urology, Dr. Diamantina Providence.  - No indication for dialysis at this time.   2. Hypertension: hypertensive urgency.  Restart amlodipine and metoprolol. Most likely will need further control.  Chronic Systolic Congestive heart failure: with acute exacerbation - IV furosemide   3. Hypocalcemia: consistent with progression of chronic kidney disease - Check PTH, PTH related peptide, vitamin D, phosphorus - IV replacement - Start calcium acetate  4. Anemia with kidney disease: hemoglobin 9.7. normocytic - Check iron studies.   LOS: 0 Anthony Hess 11/30/214:00 PM

## 2019-12-27 NOTE — ED Notes (Signed)
Hospitalist at bedside 

## 2019-12-27 NOTE — ED Notes (Signed)
Patient given grape juice.

## 2019-12-27 NOTE — Progress Notes (Signed)
[facility] for Electrolyte Monitoring and Replacement of Calcium  Recent Labs: Potassium (mmol/L)  Date Value  12/27/2019 4.7   Magnesium (mg/dL)  Date Value  12/28/2018 1.9   Calcium (mg/dL)  Date Value  12/27/2019 5.3 (LL)   Albumin (g/dL)  Date Value  12/27/2019 3.4 (L)  03/27/2018 4.2   Phosphorus (mg/dL)  Date Value  04/05/2018 3.1   Sodium (mmol/L)  Date Value  12/27/2019 141  11/30/2018 142     Assessment: Patient is a 64yo male admitted with hypocalcemia. Pharmacy consulted to replace 90-180mg  of elemental calcium slowly over 1 hour.  Ca 5.3, Albumin 3.4  Plan:  Will order Calcium Gluconate 2g (186mg  elemental Calcium) IV to infuse over 1 hour. Follow up on AM labs.  Paulina Fusi, PharmD, BCPS 12/27/2019 4:20 PM

## 2019-12-27 NOTE — ED Notes (Signed)
Lab at bedside

## 2019-12-27 NOTE — ED Notes (Signed)
Date and time results received: 12/27/19 1254 (use smartphrase ".now" to insert current time)  Test: Troponin Critical Value: 100  Name of Provider Notified: Quentin Cornwall, MD  Orders Received? Or Actions Taken?: Orders Received - See Orders for details

## 2019-12-27 NOTE — ED Notes (Signed)
Patient given ginger ale. 

## 2019-12-27 NOTE — ED Notes (Signed)
Called dietary about getting patient meal tray. They stated tray should be on the way.

## 2019-12-28 ENCOUNTER — Encounter
Admission: EM | Disposition: A | Discharge status: Home / Self Care | Payer: Self-pay | Source: Home / Self Care | Attending: Internal Medicine

## 2019-12-28 ENCOUNTER — Other Ambulatory Visit: Payer: Self-pay

## 2019-12-28 ENCOUNTER — Observation Stay
Admit: 2019-12-28 | Discharge: 2019-12-28 | Disposition: A | Discharge status: Home / Self Care | Payer: Medicaid Other | Attending: Internal Medicine | Admitting: Internal Medicine

## 2019-12-28 ENCOUNTER — Encounter: Payer: Self-pay | Admitting: Vascular Surgery

## 2019-12-28 ENCOUNTER — Other Ambulatory Visit (INDEPENDENT_AMBULATORY_CARE_PROVIDER_SITE_OTHER): Payer: Self-pay | Admitting: Vascular Surgery

## 2019-12-28 DIAGNOSIS — Z87442 Personal history of urinary calculi: Secondary | ICD-10-CM | POA: Diagnosis not present

## 2019-12-28 DIAGNOSIS — K37 Unspecified appendicitis: Secondary | ICD-10-CM | POA: Diagnosis present

## 2019-12-28 DIAGNOSIS — I509 Heart failure, unspecified: Secondary | ICD-10-CM | POA: Diagnosis not present

## 2019-12-28 DIAGNOSIS — T447X6A Underdosing of beta-adrenoreceptor antagonists, initial encounter: Secondary | ICD-10-CM | POA: Diagnosis present

## 2019-12-28 DIAGNOSIS — N185 Chronic kidney disease, stage 5: Secondary | ICD-10-CM

## 2019-12-28 DIAGNOSIS — Z992 Dependence on renal dialysis: Secondary | ICD-10-CM | POA: Diagnosis not present

## 2019-12-28 DIAGNOSIS — I429 Cardiomyopathy, unspecified: Secondary | ICD-10-CM | POA: Diagnosis present

## 2019-12-28 DIAGNOSIS — N1330 Unspecified hydronephrosis: Secondary | ICD-10-CM | POA: Diagnosis present

## 2019-12-28 DIAGNOSIS — E872 Acidosis: Secondary | ICD-10-CM | POA: Diagnosis present

## 2019-12-28 DIAGNOSIS — I10 Essential (primary) hypertension: Secondary | ICD-10-CM | POA: Diagnosis not present

## 2019-12-28 DIAGNOSIS — I132 Hypertensive heart and chronic kidney disease with heart failure and with stage 5 chronic kidney disease, or end stage renal disease: Secondary | ICD-10-CM | POA: Diagnosis present

## 2019-12-28 DIAGNOSIS — N186 End stage renal disease: Secondary | ICD-10-CM | POA: Diagnosis present

## 2019-12-28 DIAGNOSIS — I5023 Acute on chronic systolic (congestive) heart failure: Secondary | ICD-10-CM | POA: Diagnosis present

## 2019-12-28 DIAGNOSIS — K668 Other specified disorders of peritoneum: Secondary | ICD-10-CM | POA: Diagnosis present

## 2019-12-28 DIAGNOSIS — T461X6A Underdosing of calcium-channel blockers, initial encounter: Secondary | ICD-10-CM | POA: Diagnosis present

## 2019-12-28 DIAGNOSIS — N179 Acute kidney failure, unspecified: Secondary | ICD-10-CM | POA: Diagnosis present

## 2019-12-28 DIAGNOSIS — D631 Anemia in chronic kidney disease: Secondary | ICD-10-CM | POA: Diagnosis present

## 2019-12-28 DIAGNOSIS — J449 Chronic obstructive pulmonary disease, unspecified: Secondary | ICD-10-CM | POA: Diagnosis present

## 2019-12-28 DIAGNOSIS — Z8551 Personal history of malignant neoplasm of bladder: Secondary | ICD-10-CM | POA: Diagnosis not present

## 2019-12-28 DIAGNOSIS — I252 Old myocardial infarction: Secondary | ICD-10-CM | POA: Diagnosis not present

## 2019-12-28 DIAGNOSIS — I2510 Atherosclerotic heart disease of native coronary artery without angina pectoris: Secondary | ICD-10-CM | POA: Diagnosis present

## 2019-12-28 DIAGNOSIS — Z7902 Long term (current) use of antithrombotics/antiplatelets: Secondary | ICD-10-CM | POA: Diagnosis not present

## 2019-12-28 DIAGNOSIS — Z20822 Contact with and (suspected) exposure to covid-19: Secondary | ICD-10-CM | POA: Diagnosis present

## 2019-12-28 DIAGNOSIS — I160 Hypertensive urgency: Secondary | ICD-10-CM | POA: Diagnosis present

## 2019-12-28 DIAGNOSIS — F17210 Nicotine dependence, cigarettes, uncomplicated: Secondary | ICD-10-CM | POA: Diagnosis present

## 2019-12-28 DIAGNOSIS — Z91138 Patient's unintentional underdosing of medication regimen for other reason: Secondary | ICD-10-CM | POA: Diagnosis not present

## 2019-12-28 HISTORY — PX: DIALYSIS/PERMA CATHETER INSERTION: CATH118288

## 2019-12-28 LAB — ECHOCARDIOGRAM COMPLETE
AR max vel: 2.05 cm2
AV Area VTI: 2.31 cm2
AV Area mean vel: 2.27 cm2
AV Mean grad: 4 mmHg
AV Peak grad: 8 mmHg
Ao pk vel: 1.41 m/s
Area-P 1/2: 4.06 cm2
Height: 62 in
S' Lateral: 4.17 cm
Weight: 2192 oz

## 2019-12-28 LAB — HEPATITIS B CORE ANTIBODY, TOTAL: Hep B Core Total Ab: NONREACTIVE

## 2019-12-28 LAB — COMPREHENSIVE METABOLIC PANEL
ALT: 23 U/L (ref 0–44)
AST: 21 U/L (ref 15–41)
Albumin: 2.9 g/dL — ABNORMAL LOW (ref 3.5–5.0)
Alkaline Phosphatase: 55 U/L (ref 38–126)
Anion gap: 14 (ref 5–15)
BUN: 86 mg/dL — ABNORMAL HIGH (ref 8–23)
CO2: 18 mmol/L — ABNORMAL LOW (ref 22–32)
Calcium: 5.8 mg/dL — CL (ref 8.9–10.3)
Chloride: 111 mmol/L (ref 98–111)
Creatinine, Ser: 6.71 mg/dL — ABNORMAL HIGH (ref 0.61–1.24)
GFR, Estimated: 9 mL/min — ABNORMAL LOW (ref >60)
Glucose, Bld: 88 mg/dL (ref 70–99)
Potassium: 4.5 mmol/L (ref 3.5–5.1)
Sodium: 143 mmol/L (ref 135–145)
Total Bilirubin: 0.5 mg/dL (ref 0.3–1.2)
Total Protein: 5.5 g/dL — ABNORMAL LOW (ref 6.5–8.1)

## 2019-12-28 LAB — CBC
HCT: 30.8 % — ABNORMAL LOW (ref 39.0–52.0)
Hemoglobin: 9.8 g/dL — ABNORMAL LOW (ref 13.0–17.0)
MCH: 28.2 pg (ref 26.0–34.0)
MCHC: 31.8 g/dL (ref 30.0–36.0)
MCV: 88.5 fL (ref 80.0–100.0)
Platelets: 235 10*3/uL (ref 150–400)
RBC: 3.48 MIL/uL — ABNORMAL LOW (ref 4.22–5.81)
RDW: 17.1 % — ABNORMAL HIGH (ref 11.5–15.5)
WBC: 7.1 10*3/uL (ref 4.0–10.5)
nRBC: 0 % (ref 0.0–0.2)

## 2019-12-28 LAB — HEPATITIS B SURFACE ANTIBODY,QUALITATIVE: Hep B S Ab: NONREACTIVE

## 2019-12-28 LAB — HEPATITIS B CORE ANTIBODY, IGM: Hep B C IgM: NONREACTIVE

## 2019-12-28 LAB — HEPATITIS C ANTIBODY: HCV Ab: NONREACTIVE

## 2019-12-28 LAB — CALCIUM, IONIZED: Calcium, Ionized, Serum: 3 mg/dL — ABNORMAL LOW (ref 4.5–5.6)

## 2019-12-28 LAB — HEPATITIS B SURFACE ANTIGEN: Hepatitis B Surface Ag: NONREACTIVE

## 2019-12-28 SURGERY — DIALYSIS/PERMA CATHETER INSERTION
Anesthesia: Choice

## 2019-12-28 MED ORDER — DIPHENHYDRAMINE HCL 50 MG/ML IJ SOLN: 50.0000 mg | Freq: Once | INTRAMUSCULAR | Status: DC | PRN

## 2019-12-28 MED ORDER — ONDANSETRON HCL 4 MG/2ML IJ SOLN
4.0000 mg | Freq: Four times a day (QID) | INTRAMUSCULAR | Status: DC | PRN
  Administered 2019-12-31: 4 mg via INTRAVENOUS

## 2019-12-28 MED ORDER — METOPROLOL SUCCINATE ER 50 MG PO TB24
50.0000 mg | ORAL_TABLET | Freq: Every day | ORAL | Status: DC
  Administered 2019-12-31: 50 mg via ORAL
  Filled 2019-12-28 (×2): qty 1

## 2019-12-28 MED ORDER — FAMOTIDINE 20 MG PO TABS: 40.0000 mg | ORAL_TABLET | Freq: Once | ORAL | Status: DC | PRN

## 2019-12-28 MED ORDER — AMLODIPINE BESYLATE 10 MG PO TABS: 10.0000 mg | ORAL_TABLET | Freq: Every day | ORAL | Status: DC

## 2019-12-28 MED ORDER — FENTANYL CITRATE (PF) 100 MCG/2ML IJ SOLN
INTRAMUSCULAR | Status: AC
  Filled 2019-12-28: qty 2

## 2019-12-28 MED ORDER — MIDAZOLAM HCL 2 MG/ML PO SYRP: 8.0000 mg | ORAL_SOLUTION | Freq: Once | ORAL | Status: DC | PRN

## 2019-12-28 MED ORDER — CEFAZOLIN SODIUM-DEXTROSE 1-4 GM/50ML-% IV SOLN
1.0000 g | Freq: Once | INTRAVENOUS | Status: AC
  Administered 2019-12-28: 1 g via INTRAVENOUS
  Filled 2019-12-28: qty 50

## 2019-12-28 MED ORDER — CHLORHEXIDINE GLUCONATE CLOTH 2 % EX PADS
6.0000 | MEDICATED_PAD | Freq: Every day | CUTANEOUS | Status: DC
  Administered 2019-12-29 – 2020-01-01 (×3): 6 via TOPICAL

## 2019-12-28 MED ORDER — SODIUM CHLORIDE 0.9 % IV SOLN: INTRAVENOUS | Status: DC

## 2019-12-28 MED ORDER — HYDROMORPHONE HCL 1 MG/ML IJ SOLN
1.0000 mg | Freq: Once | INTRAMUSCULAR | Status: AC | PRN
  Administered 2019-12-29: 1 mg via INTRAVENOUS
  Filled 2019-12-28: qty 1

## 2019-12-28 MED ORDER — MIDAZOLAM HCL 2 MG/2ML IJ SOLN
INTRAMUSCULAR | Status: DC | PRN
  Administered 2019-12-28: 2 mg via INTRAVENOUS

## 2019-12-28 MED ORDER — HYDRALAZINE HCL 20 MG/ML IJ SOLN
10.0000 mg | Freq: Four times a day (QID) | INTRAMUSCULAR | Status: DC | PRN
  Administered 2019-12-29 – 2019-12-31 (×4): 10 mg via INTRAVENOUS
  Filled 2019-12-28 (×4): qty 1

## 2019-12-28 MED ORDER — MIDAZOLAM HCL 5 MG/5ML IJ SOLN
INTRAMUSCULAR | Status: AC
  Filled 2019-12-28: qty 5

## 2019-12-28 MED ORDER — METHYLPREDNISOLONE SODIUM SUCC 125 MG IJ SOLR: 125.0000 mg | Freq: Once | INTRAMUSCULAR | Status: DC | PRN

## 2019-12-28 MED ORDER — HEPARIN SODIUM (PORCINE) 10000 UNIT/ML IJ SOLN
INTRAMUSCULAR | Status: AC
  Filled 2019-12-28: qty 1

## 2019-12-28 MED ORDER — FENTANYL CITRATE (PF) 100 MCG/2ML IJ SOLN
INTRAMUSCULAR | Status: DC | PRN
  Administered 2019-12-28: 50 ug via INTRAVENOUS

## 2019-12-28 SURGICAL SUPPLY — 8 items
BIOPATCH RED 1 DISK 7.0 (GAUZE/BANDAGES/DRESSINGS) ×2 IMPLANT
BIOPATCH RED 1IN DISK 7.0MM (GAUZE/BANDAGES/DRESSINGS) ×1
CATH PALIN MAXID VT KIT 19CM (CATHETERS) ×3 IMPLANT
DERMABOND ADVANCED (GAUZE/BANDAGES/DRESSINGS) ×2
DERMABOND ADVANCED .7 DNX12 (GAUZE/BANDAGES/DRESSINGS) ×1 IMPLANT
PACK ANGIOGRAPHY (CUSTOM PROCEDURE TRAY) ×3 IMPLANT
SUT MNCRL AB 4-0 PS2 18 (SUTURE) ×3 IMPLANT
SUT PROLENE 0 CT 1 30 (SUTURE) ×3 IMPLANT

## 2019-12-28 NOTE — Consult Note (Signed)
[facility] Vascular Consult Note  MRN : [id number]  Anthony Hess is a 64 y.o. (May 20, 1955) male who presents with chief complaint of  Chief Complaint  Patient presents with   Hypertension   History of Present Illness:  Anthony Hess is a 64 y.o. male with medical history significant for complicated urologic history, hypertension with history of medication noncompliance, CKD 4, anemia of chronic disease, heart failure reduced ejection fraction presented to the ED at the urging of his employer for high blood pressure to the 144R systolic.  He endorses 3 days of shortness of breath.  He endorses unchanged cough.  Review of system was negative for headache, vision changes, dysphagia, abdominal pain, nausea, vomiting, chest pain, difficulty ambulation, diarrhea, dysuria, hematuria.  Patient was last seen by nephrology on 03/2018 where his creatinine was 3.02, GFR of 21. Today, patient presents with a serum creatinine of 1.54 with metabolic acidosis, hypocalcemia.   Vascular surgery was consulted by Dr. Juleen China for PermCath insertion.  Current Facility-Administered Medications  Medication Dose Route Frequency Provider Last Rate Last Admin   0.9 %  sodium chloride infusion   Intravenous Continuous Pennie Vanblarcom, Janalyn Harder, PA-C       [START ON 12/29/2019] amLODipine (NORVASC) tablet 10 mg  10 mg Oral Daily Fritzi Mandes, MD       aspirin EC tablet 81 mg  81 mg Oral Daily Cox, Amy N, DO   81 mg at 12/28/19 0810   atorvastatin (LIPITOR) tablet 40 mg  40 mg Oral Daily Cox, Amy N, DO   40 mg at 12/28/19 0810   calcium acetate (PHOSLO) capsule 1,334 mg  1,334 mg Oral TID WC Kolluru, Lurena Nida, MD       [START ON 12/29/2019] ceFAZolin (ANCEF) IVPB 1 g/50 mL premix  1 g Intravenous Once Tomia Enlow A, PA-C       diphenhydrAMINE (BENADRYL) injection 50 mg  50 mg Intravenous Once PRN Marylyn Appenzeller A, PA-C       famotidine (PEPCID) tablet 40 mg  40 mg Oral Once PRN  Lachele Lievanos A, PA-C       heparin injection 5,000 Units  5,000 Units Subcutaneous Q8H Cox, Amy N, DO   5,000 Units at 12/28/19 0546   hydrALAZINE (APRESOLINE) injection 10 mg  10 mg Intravenous Q6H PRN Fritzi Mandes, MD       HYDROmorphone (DILAUDID) injection 1 mg  1 mg Intravenous Once PRN Jaymere Alen A, PA-C       methylPREDNISolone sodium succinate (SOLU-MEDROL) 125 mg/2 mL injection 125 mg  125 mg Intravenous Once PRN Tajee Savant, Janalyn Harder, PA-C       [START ON 12/29/2019] metoprolol succinate (TOPROL-XL) 24 hr tablet 50 mg  50 mg Oral Daily Fritzi Mandes, MD       midazolam (VERSED) 2 MG/ML syrup 8 mg  8 mg Oral Once PRN Derotha Fishbaugh A, PA-C       ondansetron (ZOFRAN) injection 4 mg  4 mg Intravenous Q6H PRN Khadijatou Borak, Janalyn Harder, PA-C       Past Medical History:  Diagnosis Date   Bladder tumor    Cancer ([place])    Bladder   Chronic kidney disease    renal insufficiency   Coronary artery disease    History of kidney stones    Hypertension    Myocardial infarction [facility])    Wears glasses    Past Surgical History:  Procedure Laterality Date   CORONARY ARTERY BYPASS GRAFT N/A 12/27/2018   Procedure:  CORONARY ARTERY BYPASS GRAFTING (CABG) X 4 ON PUMP USING RIGHT & LEFT INTERNAL MAMMARY ARTERY LEFT RADIAL ARTERY ENDOSCOPICALLY HARVESTED;  Surgeon: [name], MD;  Location: [place];  Service: Open Heart Surgery;  Laterality: N/A;   CYSTOSCOPY W/ RETROGRADES Bilateral 05/15/2019   Procedure: CYSTOSCOPY WITH RETROGRADE PYELOGRAM;  Surgeon: Abbie Sons, MD;  Location: [facility];  Service: Urology;  Laterality: Bilateral;   CYSTOSCOPY WITH BIOPSY N/A 05/15/2019   Procedure: CYSTOSCOPY WITH bladder BIOPSY;  Surgeon: Abbie Sons, MD;  Location: [facility];  Service: Urology;  Laterality: N/A;   LEFT HEART CATH AND CORONARY ANGIOGRAPHY Left 12/20/2018   Procedure: LEFT HEART CATH AND CORONARY ANGIOGRAPHY;  Surgeon: Isaias Cowman, MD;   Location: [place] CV LAB;  Service: Cardiovascular;  Laterality: Left;   RADIAL ARTERY HARVEST Left 12/27/2018   Procedure: ENDOSCOPIC RADIAL ARTERY HARVEST;  Surgeon: [name], MD;  Location: [place];  Service: Open Heart Surgery;  Laterality: Left;   TEE WITHOUT CARDIOVERSION N/A 12/27/2018   Procedure: TRANSESOPHAGEAL ECHOCARDIOGRAM (TEE);  Surgeon: [name], MD;  Location: [place];  Service: Open Heart Surgery;  Laterality: N/A;   TUMOR REMOVAL  2019   Bladder   Social History Social History   Tobacco Use   Smoking status: Current Every Day Smoker    Packs/day: 0.25    Types: Cigarettes    Last attempt to quit: 02/19/2019    Years since quitting: 0.8   Smokeless tobacco: Never Used  Vaping Use   Vaping Use: Never used  Substance Use Topics   Alcohol use: Never   Drug use: Never   Family History Family History  Family history unknown: Yes  Denies family history of peripheral artery disease, venous disease or renal disease  No Known Allergies  REVIEW OF SYSTEMS (Negative unless checked)  Constitutional: [] Weight loss  [] Fever  [] Chills Cardiac: [] Chest pain   [] Chest pressure   [] Palpitations   [x] Shortness of breath when laying flat   [x] Shortness of breath at rest   [x] Shortness of breath with exertion. Vascular:  [] Pain in legs with walking   [] Pain in legs at rest   [] Pain in legs when laying flat   [] Claudication   [] Pain in feet when walking  [] Pain in feet at rest  [] Pain in feet when laying flat   [] History of DVT   [] Phlebitis   [] Swelling in legs   [] Varicose veins   [] Non-healing ulcers Pulmonary:   [] Uses home oxygen   [] Productive cough   [] Hemoptysis   [] Wheeze  [] COPD   [] Asthma Neurologic:  [] Dizziness  [] Blackouts   [] Seizures   [] History of stroke   [] History of TIA  [] Aphasia   [] Temporary blindness   [] Dysphagia   [] Weakness or numbness in arms   [] Weakness or numbness in legs Musculoskeletal:  [] Arthritis   [] Joint swelling    [] Joint pain   [] Low back pain Hematologic:  [] Easy bruising  [] Easy bleeding   [] Hypercoagulable state   [] Anemic  [] Hepatitis Gastrointestinal:  [] Blood in stool   [] Vomiting blood  [] Gastroesophageal reflux/heartburn   [] Difficulty swallowing. Genitourinary:  [] Chronic kidney disease   [] Difficult urination  [] Frequent urination  [] Burning with urination   [] Blood in urine Skin:  [] Rashes   [] Ulcers   [] Wounds Psychological:  [] History of anxiety   []  History of major depression.  Physical Examination  Vitals:   12/28/19 0430 12/28/19 0500 12/28/19 0738 12/28/19 1125  BP: (!) 160/110  (!) 157/109 (!) 143/103  Pulse:  72  72 69  Resp: 18  16 17   Temp: 97.6 F (36.4 C)  97.7 F (36.5 C) 97.8 F (36.6 C)  TempSrc: Oral     SpO2: 98%  97% 96%  Weight:  62.1 kg    Height:       Body mass index is 25.06 kg/m. Gen:  WD/WN, NAD Head: [place]/AT, No temporalis wasting. Prominent temp pulse not noted. Ear/Nose/Throat: Hearing grossly intact, nares w/o erythema or drainage, oropharynx w/o Erythema/Exudate Eyes: Sclera non-icteric, conjunctiva clear Neck: Trachea midline.  No JVD.  Pulmonary:  Good air movement, respirations not labored, equal bilaterally.  Cardiac: RRR, normal S1, S2. Vascular:  Vessel Right Left  Radial Palpable Palpable  Ulnar Palpable Palpable  Brachial Palpable Palpable  Carotid Palpable, without bruit Palpable, without bruit  Aorta Not palpable N/A  Femoral Palpable Palpable  Popliteal Palpable Palpable  PT Palpable Palpable  DP Palpable Palpable   Gastrointestinal: soft, non-tender/non-distended. No guarding/reflex.  Musculoskeletal: M/S 5/5 throughout.  Extremities without ischemic changes.  No deformity or atrophy. No edema. Neurologic: Sensation grossly intact in extremities.  Symmetrical.  Speech is fluent. Motor exam as listed above. Psychiatric: Judgment intact, Mood & affect appropriate for pt's clinical situation. Dermatologic: No rashes or ulcers  noted.  No cellulitis or open wounds. Lymph : No Cervical, Axillary, or Inguinal lymphadenopathy.  CBC Lab Results  Component Value Date   WBC 7.1 12/28/2019   HGB 9.8 (L) 12/28/2019   HCT 30.8 (L) 12/28/2019   MCV 88.5 12/28/2019   PLT 235 12/28/2019   BMET    Component Value Date/Time   NA 143 12/28/2019 0524   NA 142 11/30/2018 1426   K 4.5 12/28/2019 0524   CL 111 12/28/2019 0524   CO2 18 (L) 12/28/2019 0524   GLUCOSE 88 12/28/2019 0524   BUN 86 (H) 12/28/2019 0524   BUN 53 (H) 11/30/2018 1426   CREATININE 6.71 (H) 12/28/2019 0524   CREATININE 4.62 (H) 01/10/2019 1351   CALCIUM 5.8 (LL) 12/28/2019 0524   GFRNONAA 9 (L) 12/28/2019 0524   GFRAA 22 (L) 06/03/2019 1036   Estimated Creatinine Clearance: 8.9 mL/min (A) (by C-G formula based on SCr of 6.71 mg/dL (H)).  COAG Lab Results  Component Value Date   INR 1.3 (H) 12/27/2018   INR 1.0 12/25/2018   Radiology DG Chest 2 View  Result Date: 12/27/2019 CLINICAL DATA:  Hypertensive urgency EXAM: CHEST - 2 VIEW COMPARISON:  01/22/2019 FINDINGS: Increased interstitial prominence. No pleural effusion. No pneumothorax. Stable cardiomediastinal contours no acute osseous abnormality. IMPRESSION: Increased interstitial prominence, which may reflect mild edema. Electronically Signed   By: Macy Mis M.D.   On: 12/27/2019 12:05   US Renal  Result Date: 12/27/2019 CLINICAL DATA:  Renal failure. EXAM: RENAL / URINARY TRACT ULTRASOUND COMPLETE COMPARISON:  Abdomen and pelvis CT dated 04/03/2018. Renal ultrasound dated 12/30/2018. FINDINGS: Right Kidney: Renal measurements: 9.58.2 x 5.7 cm = volume: 233 mL. Markedly diffusely echogenic with marked diffuse parenchymal thinning. Interval marked right collecting system dilatation. Left Kidney: Renal measurements: 10.7 x 5.5 x 5.5 cm = volume: 168 mL. Diffusely echogenic with poorly defined margins. Interval marked dilatation of the collecting system. Bladder: Mildly diffusely irregular  walls with mild floating internal debris. Calculated prevoid volume of 411 cc and postvoid volume of 161 cc. Other: None. IMPRESSION: 1. Interval marked bilateral hydronephrosis. 2. Markedly echogenic kidneys compatible with marked chronic medical renal disease. 3. Marked diffuse right renal parenchymal atrophy and no significant left  renal parenchymal atrophy. 4. Irregular bladder with a moderate-sized postvoid residual. Electronically Signed   By: Claudie Revering M.D.   On: 12/27/2019 14:11   Assessment/Plan Anthony Hess is a 64 y.o. male with medical history significant for complicated urologic history, hypertension with history of medication noncompliance, CKD 4, anemia of chronic disease, heart failure reduced ejection fraction  1.  Acute on chronic kidney disease: Patient with known history of chronic kidney disease noncompliant with medications now presents with worsening kidney function requiring hemodialysis.  At this time, the patient does not have an adequate dialysis access and vascular surgery has been consulted to place a PermCath.  PermCath will allow the patient to dialyze in the inpatient outpatient setting.  Risks and benefits were explained to the patient.  All questions were answered.  The patient wants to proceed.   2.  Hypertension: Noncompliant with medications Encouraged good control as its slows the progression of atherosclerotic disease  3.  Tobacco abuse We had a discussion for approximately three minutes regarding the absolute need for smoking cessation due to the deleterious nature of tobacco on the vascular system. We discussed the tobacco use would diminish patency of any intervention, and likely significantly worsen progressio of disease. We discussed multiple agents for quitting including replacement therapy or medications to reduce cravings such as Chantix. The patient voices their understanding of the importance of smoking cessation.  Discussed with Dr. Mayme Genta, PA-C  12/28/2019 12:19 PM  This note was created with Dragon medical transcription system.  Any error is purely unintentional

## 2019-12-28 NOTE — Progress Notes (Addendum)
[facility]  ROUNDING NOTE   Subjective:   [name] was admitted to [facility] on 12/27/2019 for AKI (acute kidney injury) [facility]) [N17.9] Acute renal failure, unspecified acute renal failure type ([place]) [N17.9] Hypertension, unspecified type [I10]  Patient was last seen by nephrology on 03/2018 where his creatinine was 3.02, GFR of 21.   Patient awake and alert, resting in bed.Dr.Kolluru discussed with the patient regarding acute kidney injury requiring dialysis at this point. Patient willing to go forward with temporary catheter placement and dialysis today. Patient also requested to talk with his wife over the phone. Dr.Kolluru spoke with his wife.She is in agreement with the treatment plan, she also requested to consider peritoneal dialysis on discharge. Dialysis educator will contact patient and wife for further education.   Objective:  Vital signs in last 24 hours:  Temp:  [97.5 F (36.4 C)-98 F (36.7 C)] 97.8 F (36.6 C) (11/05 1519) Pulse Rate:  [63-79] 64 (11/05 1519) Resp:  [11-20] 16 (11/05 1519) BP: (92-184)/(70-119) 124/87 (11/05 1519) SpO2:  [90 %-100 %] 96 % (11/05 1519) Weight:  [62.1 kg] 62.1 kg (11/05 0500)  Weight change:  Filed Weights   12/27/19 1059 12/28/19 0500  Weight: 63.5 kg 62.1 kg    Intake/Output: I/O last 3 completed shifts: In: 212.3 [P.O.:120; IV Piggyback:92.3] Out: 0    Intake/Output this shift:  Total I/O In: -  Out: 600 [Urine:600]  Physical Exam: General: In no acute distress  Head: Moist oral mucosal membranes  Eyes: Sclerae and conjunctivae clear  Lungs:  Respiration even, unlabored, Wheezes+   Heart: S1S2,no rubs or gallops,Regular  Abdomen:  Soft, nontender, non distended  Extremities: No peripheral edema.  Neurologic: Oriented x 3  Skin: No acute lesions or rashes        Basic Metabolic Panel: Recent Labs  Lab 12/27/19 1108 12/27/19 1756 12/28/19 0524  NA 141  --  143  K 4.7  --  4.5  CL 109  --  111   CO2 18*  --  18*  GLUCOSE 101*  --  88  BUN 90*  --  86*  CREATININE 6.35*  --  6.71*  CALCIUM 5.3*  --  5.8*  MG  --  2.1  --     Liver Function Tests: Recent Labs  Lab 12/27/19 1108 12/28/19 0524  AST 30 21  ALT 29 23  ALKPHOS 56 55  BILITOT 0.7 0.5  PROT 6.4* 5.5*  ALBUMIN 3.4* 2.9*   No results for input(s): LIPASE, AMYLASE in the last 168 hours. No results for input(s): AMMONIA in the last 168 hours.  CBC: Recent Labs  Lab 12/27/19 1108 12/28/19 0524  WBC 8.7 7.1  NEUTROABS 6.9  --   HGB 9.7* 9.8*  HCT 30.0* 30.8*  MCV 88.0 88.5  PLT 232 235    Cardiac Enzymes: No results for input(s): CKTOTAL, CKMB, CKMBINDEX, TROPONINI in the last 168 hours.  BNP: Invalid input(s): POCBNP  CBG: No results for input(s): GLUCAP in the last 168 hours.  Microbiology: Results for orders placed or performed during the hospital encounter of 12/27/19  Respiratory Panel by RT PCR (Flu A&B, Covid) - Nasopharyngeal Swab     Status: None   Collection Time: 12/27/19 12:29 PM   Specimen: Nasopharyngeal Swab  Result Value Ref Range Status   SARS Coronavirus 2 by RT PCR NEGATIVE NEGATIVE Final    Comment: (NOTE) SARS-CoV-2 target nucleic acids are NOT DETECTED.  The SARS-CoV-2 RNA is generally detectable  in upper respiratoy specimens during the acute phase of infection. The lowest concentration of SARS-CoV-2 viral copies this assay can detect is 131 copies/mL. A negative result does not preclude SARS-Cov-2 infection and should not be used as the sole basis for treatment or other patient management decisions. A negative result may occur with  improper specimen collection/handling, submission of specimen other than nasopharyngeal swab, presence of viral mutation(s) within the areas targeted by this assay, and inadequate number of viral copies (<131 copies/mL). A negative result must be combined with clinical observations, patient history, and epidemiological information.  The expected result is Negative.  Fact Sheet for Patients:  PinkCheek.be  Fact Sheet for Healthcare Providers:  GravelBags.it  This test is no t yet approved or cleared by the [place] FDA and  has been authorized for detection and/or diagnosis of SARS-CoV-2 by FDA under an Emergency Use Authorization (EUA). This EUA will remain  in effect (meaning this test can be used) for the duration of the COVID-19 declaration under Section 564(b)(1) of the Act, 21 U.S.C. section 360bbb-3(b)(1), unless the authorization is terminated or revoked sooner.     Influenza A by PCR NEGATIVE NEGATIVE Final   Influenza B by PCR NEGATIVE NEGATIVE Final    Comment: (NOTE) The Xpert Xpress SARS-CoV-2/FLU/RSV assay is intended as an aid in  the diagnosis of influenza from Nasopharyngeal swab specimens and  should not be used as a sole basis for treatment. Nasal washings and  aspirates are unacceptable for Xpert Xpress SARS-CoV-2/FLU/RSV  testing.  Fact Sheet for Patients: PinkCheek.be  Fact Sheet for Healthcare Providers: GravelBags.it  This test is not yet approved or cleared by the [place] FDA and  has been authorized for detection and/or diagnosis of SARS-CoV-2 by  FDA under an Emergency Use Authorization (EUA). This EUA will remain  in effect (meaning this test can be used) for the duration of the  Covid-19 declaration under Section 564(b)(1) of the Act, 21  U.S.C. section 360bbb-3(b)(1), unless the authorization is  terminated or revoked. Performed at [facility], [address] [place], [place] [address]     Coagulation Studies: No results for input(s): LABPROT, INR in the last 72 hours.  Urinalysis: Recent Labs    12/27/19 1756  COLORURINE STRAW*  LABSPEC 1.010  PHURINE 5.0  GLUCOSEU NEGATIVE  HGBUR NEGATIVE  BILIRUBINUR NEGATIVE  KETONESUR  NEGATIVE  PROTEINUR >=300*  NITRITE NEGATIVE  LEUKOCYTESUR NEGATIVE      Imaging: DG Chest 2 View  Result Date: 12/27/2019 CLINICAL DATA:  Hypertensive urgency EXAM: CHEST - 2 VIEW COMPARISON:  01/22/2019 FINDINGS: Increased interstitial prominence. No pleural effusion. No pneumothorax. Stable cardiomediastinal contours no acute osseous abnormality. IMPRESSION: Increased interstitial prominence, which may reflect mild edema. Electronically Signed   By: Macy Mis M.D.   On: 12/27/2019 12:05   US Renal  Result Date: 12/27/2019 CLINICAL DATA:  Renal failure. EXAM: RENAL / URINARY TRACT ULTRASOUND COMPLETE COMPARISON:  Abdomen and pelvis CT dated 04/03/2018. Renal ultrasound dated 12/30/2018. FINDINGS: Right Kidney: Renal measurements: 9.58.2 x 5.7 cm = volume: 233 mL. Markedly diffusely echogenic with marked diffuse parenchymal thinning. Interval marked right collecting system dilatation. Left Kidney: Renal measurements: 10.7 x 5.5 x 5.5 cm = volume: 168 mL. Diffusely echogenic with poorly defined margins. Interval marked dilatation of the collecting system. Bladder: Mildly diffusely irregular walls with mild floating internal debris. Calculated prevoid volume of 411 cc and postvoid volume of 161 cc. Other: None. IMPRESSION: 1. Interval marked bilateral hydronephrosis. 2.  Markedly echogenic kidneys compatible with marked chronic medical renal disease. 3. Marked diffuse right renal parenchymal atrophy and no significant left renal parenchymal atrophy. 4. Irregular bladder with a moderate-sized postvoid residual. Electronically Signed   By: Claudie Revering M.D.   On: 12/27/2019 14:11   PERIPHERAL VASCULAR CATHETERIZATION  Result Date: 12/28/2019 See op note  ECHOCARDIOGRAM COMPLETE  Result Date: 12/28/2019    ECHOCARDIOGRAM REPORT   Patient Name:   Anthony Hess Date of Exam: 12/28/2019 Medical Rec #:  [id number]    Height:       62.0 in Accession #:    [id number]   Weight:       137.0 lb Date of  Birth:  19-Jul-1955   BSA:          1.628 m Patient Age:    76 years     BP:           157/109 mmHg Patient Gender: M            HR:           69 bpm. Exam Location:  [facility] Procedure: 2D Echo, Color Doppler and Cardiac Doppler Indications:     R06.00 Dyspnea  History:         Patient has prior history of Echocardiogram examinations.                  Previous Myocardial Infarction and CAD, Prior CABG, CKD; Risk                  Factors:Hypertension.  Sonographer:     Charmayne Sheer RDCS (AE) Referring Phys:  [id number] AMY N COX Diagnosing Phys: Isaias Cowman MD IMPRESSIONS  1. Left ventricular ejection fraction, by estimation, is 20 to 25%. The left ventricle has severely decreased function. The left ventricle has no regional wall motion abnormalities. The left ventricular internal cavity size was moderately dilated. Left ventricular diastolic parameters are consistent with Grade I diastolic dysfunction (impaired relaxation).  2. Right ventricular systolic function is normal. The right ventricular size is normal.  3. The mitral valve is normal in structure. Moderate mitral valve regurgitation. No evidence of mitral stenosis.  4. The aortic valve is normal in structure. Aortic valve regurgitation is mild to moderate. No aortic stenosis is present.  5. The inferior vena cava is normal in size with greater than 50% respiratory variability, suggesting right atrial pressure of 3 mmHg. FINDINGS  Left Ventricle: Left ventricular ejection fraction, by estimation, is 20 to 25%. The left ventricle has severely decreased function. The left ventricle has no regional wall motion abnormalities. The left ventricular internal cavity size was moderately dilated. There is no left ventricular hypertrophy. Left ventricular diastolic parameters are consistent with Grade I diastolic dysfunction (impaired relaxation). Right Ventricle: The right ventricular size is normal. No increase in right ventricular wall thickness. Right ventricular  systolic function is normal. Left Atrium: Left atrial size was normal in size. Right Atrium: Right atrial size was normal in size. Pericardium: There is no evidence of pericardial effusion. Mitral Valve: The mitral valve is normal in structure. Moderate mitral valve regurgitation. No evidence of mitral valve stenosis. MV peak gradient, 4.6 mmHg. The mean mitral valve gradient is 2.0 mmHg. Tricuspid Valve: The tricuspid valve is normal in structure. Tricuspid valve regurgitation is mild . No evidence of tricuspid stenosis. Aortic Valve: The aortic valve is normal in structure. Aortic valve regurgitation is mild to moderate. No aortic stenosis is present. Aortic valve mean gradient measures  4.0 mmHg. Aortic valve peak gradient measures 8.0 mmHg. Aortic valve area, by VTI measures 2.31 cm. Pulmonic Valve: The pulmonic valve was normal in structure. Pulmonic valve regurgitation is not visualized. No evidence of pulmonic stenosis. Aorta: The aortic root is normal in size and structure. Venous: The inferior vena cava is normal in size with greater than 50% respiratory variability, suggesting right atrial pressure of 3 mmHg. IAS/Shunts: No atrial level shunt detected by color flow Doppler.  LEFT VENTRICLE PLAX 2D LVIDd:         4.52 cm  Diastology LVIDs:         4.17 cm  LV e' medial:    2.94 cm/s LV PW:         1.55 cm  LV E/e' medial:  21.8 LV IVS:        1.04 cm  LV e' lateral:   4.79 cm/s LVOT diam:     1.90 cm  LV E/e' lateral: 13.4 LV SV:         54 LV SV Index:   33 LVOT Area:     2.84 cm  RIGHT VENTRICLE RV Basal diam:  3.11 cm LEFT ATRIUM             Index       RIGHT ATRIUM           Index LA diam:        3.70 cm 2.27 cm/m  RA Area:     10.30 cm LA Vol (A2C):   36.7 ml 22.55 ml/m RA Volume:   23.20 ml  14.25 ml/m LA Vol (A4C):   36.5 ml 22.42 ml/m LA Biplane Vol: 37.0 ml 22.73 ml/m  AORTIC VALVE                   PULMONIC VALVE AV Area (Vmax):    2.05 cm    PV Vmax:       0.98 m/s AV Area (Vmean):   2.27  cm    PV Vmean:      61.000 cm/s AV Area (VTI):     2.31 cm    PV VTI:        0.160 m AV Vmax:           141.00 cm/s PV Peak grad:  3.8 mmHg AV Vmean:          88.300 cm/s PV Mean grad:  2.0 mmHg AV VTI:            0.232 m AV Peak Grad:      8.0 mmHg AV Mean Grad:      4.0 mmHg LVOT Vmax:         102.00 cm/s LVOT Vmean:        70.800 cm/s LVOT VTI:          0.189 m LVOT/AV VTI ratio: 0.81  AORTA Ao Root diam: 3.60 cm MITRAL VALVE                TRICUSPID VALVE MV Area (PHT): 4.06 cm     TR Peak grad:   22.8 mmHg MV Peak grad:  4.6 mmHg     TR Vmax:        239.00 cm/s MV Mean grad:  2.0 mmHg MV Vmax:       1.07 m/s     SHUNTS MV Vmean:      65.7 cm/s    Systemic VTI:  0.19 m MV Decel Time: 187 msec  Systemic Diam: 1.90 cm MV E velocity: 64.00 cm/s MV A velocity: 104.00 cm/s MV E/A ratio:  0.62 Isaias Cowman MD Electronically signed by Isaias Cowman MD Signature Date/Time: 12/28/2019/1:46:03 PM    Final      Medications:    . [START ON 12/29/2019] amLODipine  10 mg Oral Daily  . aspirin EC  81 mg Oral Daily  . atorvastatin  40 mg Oral Daily  . calcium acetate  1,334 mg Oral TID WC  . [START ON 12/29/2019] Chlorhexidine Gluconate Cloth  6 each Topical Q0600  . fentaNYL      . heparin      . heparin  5,000 Units Subcutaneous Q8H  . [START ON 12/29/2019] metoprolol succinate  50 mg Oral Daily  . midazolam         Assessment/ Plan:  [name] is a 64 y.o. white male with hypertension, coronary artery disease, nephrolithiasis, history of bladder cancer who is admitted to [facility] on 12/27/2019 for AKI (acute kidney injury) ([place]) [N17.9] Acute renal failure, unspecified acute renal failure type ([place]) [N17.9] Hypertension, unspecified type [I10]  # Acute kidney injury with metabolic acidosis on chronic kidney disease stage IV: baseline creatinine of 3.26, GFR of 19 on 06/03/2019.  Acute kidney injury seems secondary to obstructive uropathy versus progression of chronic kidney disease.     Renal US on 12/27/2019 IMPRESSION: 1. Interval marked bilateral hydronephrosis. 2. Markedly echogenic kidneys compatible with marked chronic medical renal disease. 3. Marked diffuse right renal parenchymal atrophy and no significant left renal parenchymal atrophy. 4. Irregular bladder with a moderate-sized postvoid residual.  Urology team is not recommending any interventions as Hydroureteonephrosis is chronic   Lab Results  Component Value Date   CREATININE 6.71 (H) 12/28/2019   CREATININE 6.35 (H) 12/27/2019   CREATININE 3.26 (H) 06/03/2019  Worsening Renal function BUN 86 GFR 9 Vascular consulted for Temporary dialysis access placement Patient is NPO Planning dialysis today  # Hypertension Blood Pressure readings were elevated on admission Normotensive today  Amlodipine and Metoprolol restarted yesterday  #Chronic Systolic Congestive heart failure: with acute exacerbation -Echocardiogram today -Elevated BNP  #Anemia with kidney disease Hemoglobin 9.8 No acute indication for Epogen  #Hypocalcemia  Received Calcium Gluconate Calcium 5.8 today Paient is on Phoslo     LOS: 0 Anthony Hess 11/5/20214:04 PM

## 2019-12-28 NOTE — Op Note (Signed)
OPERATIVE NOTE    PRE-OPERATIVE DIAGNOSIS: 1. ESRD   POST-OPERATIVE DIAGNOSIS: same as above  PROCEDURE: 1. Ultrasound guidance for vascular access to the right internal jugular vein 2. Fluoroscopic guidance for placement of catheter 3. Placement of a 19 cm tip to cuff tunneled hemodialysis catheter via the right internal jugular vein  SURGEON: [name], MD  ANESTHESIA:  Local with Moderate conscious sedation for approximately 14 minutes using 2 mg of Versed and 50 mcg of Fentanyl  ESTIMATED BLOOD LOSS: 5 cc  FLUORO TIME: less than one minute  CONTRAST: none  FINDING(S): 1.  Patent right internal jugular vein  SPECIMEN(S):  None  INDICATIONS:   Anthony Hess is a 64 y.o.male who presents with renal failure.  The patient needs long term dialysis access for their ESRD, and a Permcath is necessary.  Risks and benefits are discussed and informed consent is obtained.    DESCRIPTION: After obtaining full informed written consent, the patient was brought back to the vascular suited. The patient's right neck and chest were sterilely prepped and draped in a sterile surgical field was created. Moderate conscious sedation was administered during a face to face encounter with the patient throughout the procedure with my supervision of the RN administering medicines and monitoring the patient's vital signs, pulse oximetry, telemetry and mental status throughout from the start of the procedure until the patient was taken to the recovery room.  The right internal jugular vein was visualized with ultrasound and found to be patent. It was then accessed under direct ultrasound guidance and a permanent image was recorded. A wire was placed. After skin nick and dilatation, the peel-away sheath was placed over the wire. I then turned my attention to an area under the clavicle. Approximately 1-2 fingerbreadths below the clavicle a small counterincision was created and tunneled from the subclavicular  incision to the access site. Using fluoroscopic guidance, a 19 centimeter tip to cuff tunneled hemodialysis catheter was selected, and tunneled from the subclavicular incision to the access site. It was then placed through the peel-away sheath and the peel-away sheath was removed. Using fluoroscopic guidance the catheter tips were parked in the right atrium. The appropriate distal connectors were placed. It withdrew blood well and flushed easily with heparinized saline and a concentrated heparin solution was then placed. It was secured to the chest wall with 2 Prolene sutures. The access incision was closed single 4-0 Monocryl. A 4-0 Monocryl pursestring suture was placed around the exit site. Sterile dressings were placed. The patient tolerated the procedure well and was taken to the recovery room in stable condition.  COMPLICATIONS: None  CONDITION: Stable  [name], MD 12/28/2019 2:19 PM   This note was created with Dragon Medical transcription system. Any errors in dictation are purely unintentional.

## 2019-12-28 NOTE — ED Provider Notes (Signed)
[facility] Emergency Department Provider Note    First MD Initiated Contact with Patient 12/27/19 1146     (approximate)  I have reviewed the triage vital signs and the nursing notes.   HISTORY  Chief Complaint Hypertension    HPI Anthony Hess is a 64 y.o. male presents to the ER for evaluation of high blood pressure.  Patient was asymptomatic.  Was at work today not planing of any symptoms but had screening vital signs he states he knows that he was significantly hypertensive and called the symptoms to the ER.  States he was previously on blood pressure medications but has not taken in several months.  Does not really give a good reason as to why.  He denies any chest pain or shortness of breath.  No blurry vision.    Past Medical History:  Diagnosis Date  . Bladder tumor   . Cancer [facility])    Bladder  . Chronic kidney disease    renal insufficiency  . Coronary artery disease   . History of kidney stones   . Hypertension   . Myocardial infarction ([place])   . Wears glasses    Family History  Family history unknown: Yes   Past Surgical History:  Procedure Laterality Date  . CORONARY ARTERY BYPASS GRAFT N/A 12/27/2018   Procedure: CORONARY ARTERY BYPASS GRAFTING (CABG) X 4 ON PUMP USING RIGHT & LEFT INTERNAL MAMMARY ARTERY LEFT RADIAL ARTERY ENDOSCOPICALLY HARVESTED;  Surgeon: [name], MD;  Location: [place];  Service: Open Heart Surgery;  Laterality: N/A;  . CYSTOSCOPY W/ RETROGRADES Bilateral 05/15/2019   Procedure: CYSTOSCOPY WITH RETROGRADE PYELOGRAM;  Surgeon: Abbie Sons, MD;  Location: [facility] ORS;  Service: Urology;  Laterality: Bilateral;  . CYSTOSCOPY WITH BIOPSY N/A 05/15/2019   Procedure: CYSTOSCOPY WITH bladder BIOPSY;  Surgeon: Abbie Sons, MD;  Location: [facility];  Service: Urology;  Laterality: N/A;  . LEFT HEART CATH AND CORONARY ANGIOGRAPHY Left 12/20/2018   Procedure: LEFT HEART CATH AND CORONARY ANGIOGRAPHY;  Surgeon:  Isaias Cowman, MD;  Location: [place] CV LAB;  Service: Cardiovascular;  Laterality: Left;  . RADIAL ARTERY HARVEST Left 12/27/2018   Procedure: ENDOSCOPIC RADIAL ARTERY HARVEST;  Surgeon: [name], MD;  Location: [place];  Service: Open Heart Surgery;  Laterality: Left;  . TEE WITHOUT CARDIOVERSION N/A 12/27/2018   Procedure: TRANSESOPHAGEAL ECHOCARDIOGRAM (TEE);  Surgeon: [name], MD;  Location: [place];  Service: Open Heart Surgery;  Laterality: N/A;  . TUMOR REMOVAL  2019   Bladder   Patient Active Problem List   Diagnosis Date Noted  . Hydroureteronephrosis   . Acute congestive heart failure ([place])   . Acute renal failure superimposed on stage 5 chronic kidney disease, not on chronic dialysis ([place]) 12/27/2019  . Atrial fibrillation ([place]) 01/05/2019  . Chronic kidney disease (CKD), stage IV (severe) ([place]) 01/05/2019  . 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 Emphysema lung ([place]) 04/14/2018  . Bilateral hydronephrosis 04/03/2018  . Hypertension 03/27/2018  . Cigarette smoker 03/20/2018  . History of bladder cancer 03/20/2018      Prior to Admission medications   Medication Sig Start Date End Date Taking? Authorizing Provider  amLODipine (NORVASC) 5 MG tablet Take 1 tablet (5 mg total) by mouth daily. 05/31/19  Yes Volney American, PA-C  aspirin EC 81 MG tablet Take 81 mg by mouth daily.   Yes [name], MD  atorvastatin (LIPITOR) 40 MG tablet Take 1 tablet (  40 mg total) by mouth daily at 6 PM. Patient taking differently: Take 40 mg by mouth daily.  01/05/19  Yes Roddenberry, Arlis Porta, PA-C  clopidogrel (PLAVIX) 75 MG tablet Take 1 tablet (75 mg total) by mouth daily. 01/06/19  Yes Roddenberry, Arlis Porta, PA-C  metoprolol succinate (TOPROL-XL) 25 MG 24 hr tablet Take 1 tablet (25 mg total) by mouth daily. 02/21/19  Yes Volney American, PA-C  traMADol (ULTRAM) 50 MG tablet Take 50 mg by mouth 2 (two) times daily as needed (pain.).   Yes [provider]  amiodarone (PACERONE) 200 MG tablet Take 200 mg by mouth 2 (two) times daily. Patient not taking: Reported on 12/27/2019 08/11/19   [provider]  Saccharomyces boulardii (PROBIOTIC) 250 MG CAPS Take 1 capsule by mouth in the morning and at bedtime. Patient not taking: Reported on 12/27/2019 06/03/19   Merlyn Lot, MD    Allergies Patient has no known allergies.    Social History Social History   Tobacco Use  . Smoking status: Current Every Day Smoker    Packs/day: 0.25    Types: Cigarettes    Last attempt to quit: 02/19/2019    Years since quitting: 0.8  . Smokeless tobacco: Never Used  Vaping Use  . Vaping Use: Never used  Substance Use Topics  . Alcohol use: Never  . Drug use: Never    Review of Systems Patient denies headaches, rhinorrhea, blurry vision, numbness, shortness of breath, chest pain, edema, cough, abdominal pain, nausea, vomiting, diarrhea, dysuria, fevers, rashes or hallucinations unless otherwise stated above in HPI. ____________________________________________   PHYSICAL EXAM:  VITAL SIGNS: Vitals:   12/28/19 1500 12/28/19 1519  BP: 116/79 124/87  Pulse: 63 64  Resp: 12 16  Temp:  97.8 F (36.6 C)  SpO2: 98% 96%    Constitutional: Alert and oriented.  Eyes: Conjunctivae are normal.  Head: Atraumatic. Nose: No congestion/rhinnorhea. Mouth/Throat: Mucous membranes are moist.   Neck: No stridor. Painless ROM.  Cardiovascular: Normal rate, regular rhythm. Grossly normal heart sounds.  Good peripheral circulation. Respiratory: Normal respiratory effort.  No retractions. Lungs CTAB. Gastrointestinal: Soft and nontender. No distention. No abdominal bruits. No CVA tenderness. Genitourinary:  Musculoskeletal: No lower extremity tenderness nor edema.  No joint effusions. Neurologic:  Normal speech and language. No gross focal neurologic deficits are appreciated. No facial droop Skin:  Skin is warm, dry and intact. No  rash noted. Psychiatric: Mood and affect are normal. Speech and behavior are normal.  ____________________________________________   LABS (all labs ordered are listed, but only abnormal results are displayed)  Results for orders placed or performed during the hospital encounter of 12/27/19 (from the past 24 hour(s))  Hemoglobin A1c     Status: None   Collection Time: 12/27/19  3:50 PM  Result Value Ref Range   Hgb A1c MFr Bld 5.6 4.8 - 5.6 %   Mean Plasma Glucose 114.02 mg/dL  HIV Antibody (routine testing w rflx)     Status: None   Collection Time: 12/27/19  3:51 PM  Result Value Ref Range   HIV Screen 4th Generation wRfx Non Reactive Non Reactive  Urinalysis, Routine w reflex microscopic     Status: Abnormal   Collection Time: 12/27/19  5:56 PM  Result Value Ref Range   Color, Urine STRAW (A) YELLOW   APPearance CLEAR (A) CLEAR   Specific Gravity, Urine 1.010 1.005 - 1.030   pH 5.0 5.0 - 8.0   Glucose, UA NEGATIVE NEGATIVE mg/dL  Hgb urine dipstick NEGATIVE NEGATIVE   Bilirubin Urine NEGATIVE NEGATIVE   Ketones, ur NEGATIVE NEGATIVE mg/dL   Protein, ur >=300 (A) NEGATIVE mg/dL   Nitrite NEGATIVE NEGATIVE   Leukocytes,Ua NEGATIVE NEGATIVE   RBC / HPF 0-5 0 - 5 RBC/hpf   WBC, UA 0-5 0 - 5 WBC/hpf   Bacteria, UA NONE SEEN NONE SEEN   Squamous Epithelial / LPF NONE SEEN 0 - 5   Mucus PRESENT   Magnesium     Status: None   Collection Time: 12/27/19  5:56 PM  Result Value Ref Range   Magnesium 2.1 1.7 - 2.4 mg/dL  Ferritin     Status: None   Collection Time: 12/27/19  5:56 PM  Result Value Ref Range   Ferritin 45 24 - 336 ng/mL  Iron and TIBC     Status: Abnormal   Collection Time: 12/27/19  5:56 PM  Result Value Ref Range   Iron 39 (L) 45 - 182 ug/dL   TIBC 221 (L) 250 - 450 ug/dL   Saturation Ratios 18 17.9 - 39.5 %   UIBC 182 ug/dL  Hepatitis C antibody     Status: None   Collection Time: 12/27/19 11:07 PM  Result Value Ref Range   HCV Ab NON REACTIVE NON  REACTIVE  Hepatitis B surface antibody,qualitative     Status: None   Collection Time: 12/27/19 11:07 PM  Result Value Ref Range   Hep B S Ab NON REACTIVE NON REACTIVE  Hepatitis B core antibody, IgM     Status: None   Collection Time: 12/27/19 11:07 PM  Result Value Ref Range   Hep B C IgM NON REACTIVE NON REACTIVE  Hepatitis B surface antigen     Status: None   Collection Time: 12/27/19 11:07 PM  Result Value Ref Range   Hepatitis B Surface Ag NON REACTIVE NON REACTIVE  Hepatitis B core antibody, total     Status: None   Collection Time: 12/27/19 11:07 PM  Result Value Ref Range   Hep B Core Total Ab NON REACTIVE NON REACTIVE  Comprehensive metabolic panel     Status: Abnormal   Collection Time: 12/28/19  5:24 AM  Result Value Ref Range   Sodium 143 135 - 145 mmol/L   Potassium 4.5 3.5 - 5.1 mmol/L   Chloride 111 98 - 111 mmol/L   CO2 18 (L) 22 - 32 mmol/L   Glucose, Bld 88 70 - 99 mg/dL   BUN 86 (H) 8 - 23 mg/dL   Creatinine, Ser 6.71 (H) 0.61 - 1.24 mg/dL   Calcium 5.8 (LL) 8.9 - 10.3 mg/dL   Total Protein 5.5 (L) 6.5 - 8.1 g/dL   Albumin 2.9 (L) 3.5 - 5.0 g/dL   AST 21 15 - 41 U/L   ALT 23 0 - 44 U/L   Alkaline Phosphatase 55 38 - 126 U/L   Total Bilirubin 0.5 0.3 - 1.2 mg/dL   GFR, Estimated 9 (L) >60 mL/min   Anion gap 14 5 - 15  CBC     Status: Abnormal   Collection Time: 12/28/19  5:24 AM  Result Value Ref Range   WBC 7.1 4.0 - 10.5 K/uL   RBC 3.48 (L) 4.22 - 5.81 MIL/uL   Hemoglobin 9.8 (L) 13.0 - 17.0 g/dL   HCT 30.8 (L) 39 - 52 %   MCV 88.5 80.0 - 100.0 fL   MCH 28.2 26.0 - 34.0 pg   MCHC 31.8 30.0 - 36.0 g/dL  RDW 17.1 (H) 11.5 - 15.5 %   Platelets 235 150 - 400 K/uL   nRBC 0.0 0.0 - 0.2 %   ____________________________________________  EKG My review and personal interpretation at Time: 11:05   Indication: htn  Rate: 80  Rhythm: sinus Axis: normal Other: nonspecific st and t wave abnm ____________________________________________  RADIOLOGY  I  personally reviewed all radiographic images ordered to evaluate for the above acute complaints and reviewed radiology reports and findings.  These findings were personally discussed with the patient.  Please see medical record for radiology report.  ____________________________________________   PROCEDURES  Procedure(s) performed:  Procedures    Critical Care performed: no ____________________________________________   INITIAL IMPRESSION / ASSESSMENT AND PLAN / ED COURSE  Pertinent labs & imaging results that were available during my care of the patient were reviewed by me and considered in my medical decision making (see chart for details).   DDX: aki, electrolyte abn, chf, acs, obstructive uropathy  Raphel Stickles is a 64 y.o. who presents to the ED with evidence of acute renal failure.  Patient minimally symptomatic at this point.  Chest x-ray with some possible etiologies not complaining shortness of breath no hypoxia.  EKG with some nonspecific changes.  Troponin is mildly elevated but may be secondary to his renal failure.  I have ordered ultrasound to evaluate for obstructive uropathy.  Noted hypocalcemia.  Patient will require hospitalization for further work-up and management.  Clinical Course as of Dec 27 1532  Thu Dec 27, 2019  1219 Monocyte #: [phone] [PR]    Clinical Course User Index [PR] Merlyn Lot, MD    The patient was evaluated in Emergency Department today for the symptoms described in the history of present illness. He/she was evaluated in the context of the global COVID-19 pandemic, which necessitated consideration that the patient might be at risk for infection with the SARS-CoV-2 virus that causes COVID-19. Institutional protocols and algorithms that pertain to the evaluation of patients at risk for COVID-19 are in a state of rapid change based on information released by regulatory bodies including the CDC and federal and state organizations. These policies and  algorithms were followed during the patient's care in the ED.  As part of my medical decision making, I reviewed the following data within the [place] notes reviewed and incorporated, Labs reviewed, notes from prior ED visits and [place] Controlled Substance Database   ____________________________________________   FINAL CLINICAL IMPRESSION(S) / ED DIAGNOSES  Final diagnoses:  Acute renal failure, unspecified acute renal failure type ([place])  Hypertension, unspecified type      NEW MEDICATIONS STARTED DURING THIS VISIT:  Current Discharge Medication List       Note:  This document was prepared using Dragon voice recognition software and may include unintentional dictation errors.    Merlyn Lot, MD 12/28/19 1534

## 2019-12-28 NOTE — Progress Notes (Signed)
*  PRELIMINARY RESULTS* Echocardiogram 2D Echocardiogram has been performed.  Anthony Hess 12/28/2019, 9:42 AM

## 2019-12-28 NOTE — Plan of Care (Signed)
Nutrition Education Note  RD consulted for Renal Education.   Attempted to meet with patient at bedside multiple times throughout the day but he was out of room in procedure. RD left handouts in patient's room at bedside. Able to speak with patient over the phone later in the evening after he returned from PermCath placement and before he was taken for dialysis. Patient reports he has a good appetite and intake at baseline. He typically eats 2-3 meals per day. Patient reports if he eats breakfast he usually has eggs. Lunch is usually hamburger with fries. Dinner may be spaghetti, cubed steak, pot roast with green beans or other side. Patient denies any weight loss. He reports he is weight-stable at around 140 lbs.   Provided "Chronic Kidney Disease Stages 3-5 Nutrition Therapy", "Potassium Content of Foods" and "Phosphorus Content of Foods" handouts to patient from the [employer]. Explained why diet restrictions are needed and provided lists of foods to limit/avoid that are high potassium, sodium, and phosphorus. Provided specific recommendations on safer alternatives of these foods. Strongly encouraged compliance of this diet.   Discussed importance of protein intake at each meal and snack. Provided examples of how to maximize protein intake throughout the day. Discussed need for fluid restriction with dialysis, importance of minimizing weight gain between HD treatments, and renal-friendly beverage options.  Encouraged pt to discuss specific diet questions/concerns with RD at HD outpatient facility. Teach back method used.  Expect good compliance.  Body mass index is 25.06 kg/m. Pt meets criteria for overweight based on current BMI.  Current diet order is renal. No meal documentation completed at this time as patient was NPO most of the day for procedure and then was taken to dialysis. Labs and medications reviewed. No further nutrition interventions warranted at this time.  RD contact information provided. If additional nutrition issues arise, please re-consult RD.  Jacklynn Barnacle, MS, RD, LDN Pager number available on Amion

## 2019-12-28 NOTE — Progress Notes (Signed)
[facility] NAME: Anthony Hess    MR#:  [id number]  DATE OF BIRTH:  07-Jun-1955  SUBJECTIVE:  came in with increasing shortness of breath and elevated blood pressure at work. Poor compliance with MD follow-up and medication at home. Continues to smoke heavy. No family in the room  REVIEW OF SYSTEMS:   Review of Systems  Constitutional: Negative for chills, fever and weight loss.  HENT: Negative for ear discharge, ear pain and nosebleeds.   Eyes: Negative for blurred vision, pain and discharge.  Respiratory: Positive for shortness of breath and wheezing. Negative for sputum production and stridor.   Cardiovascular: Negative for chest pain, palpitations, orthopnea and PND.  Gastrointestinal: Negative for abdominal pain, diarrhea, nausea and vomiting.  Genitourinary: Negative for frequency and urgency.  Musculoskeletal: Positive for back pain. Negative for joint pain.  Neurological: Positive for weakness. Negative for sensory change, speech change and focal weakness.  Psychiatric/Behavioral: Negative for depression and hallucinations. The patient is not nervous/anxious.    Tolerating Diet:yes Tolerating PT:   DRUG ALLERGIES:  No Known Allergies  VITALS:  Blood pressure 116/79, pulse 63, temperature 98 F (36.7 C), temperature source Oral, resp. rate 12, height 5\' 2"  (1.575 m), weight 62.1 kg, SpO2 98 %.  PHYSICAL EXAMINATION:   Physical Exam  GENERAL:  64 y.o.-year-old patient lying in the bed with no acute distress. Weak, thin HEENT: Head atraumatic, normocephalic. Oropharynx and nasopharynx clear. Poor dentition NECK:  Supple, no jugular venous distention. No thyroid enlargement, no tenderness.  LUNGS: decreased breath sounds bilaterally, no wheezing, rales, rhonchi. No use of accessory muscles of respiration.  CARDIOVASCULAR: S1, S2 normal. No murmurs, rubs, or gallops.  ABDOMEN: Soft, nontender, nondistended. Bowel sounds  present. No organomegaly or mass.  EXTREMITIES: No cyanosis, clubbing or edema b/l.    NEUROLOGIC: Cranial nerves II through XII are intact. No focal Motor or sensory deficits b/l.   PSYCHIATRIC:  patient is alert and oriented x 3.  SKIN: No obvious rash, lesion, or ulcer.   LABORATORY PANEL:  CBC Recent Labs  Lab 12/28/19 0524  WBC 7.1  HGB 9.8*  HCT 30.8*  PLT 235    Chemistries  Recent Labs  Lab 12/27/19 1108 12/27/19 1756 12/28/19 0524  NA   < >  --  143  K   < >  --  4.5  CL   < >  --  111  CO2   < >  --  18*  GLUCOSE   < >  --  88  BUN   < >  --  86*  CREATININE   < >  --  6.71*  CALCIUM   < >  --  5.8*  MG  --  2.1  --   AST   < >  --  21  ALT   < >  --  23  ALKPHOS   < >  --  55  BILITOT   < >  --  0.5   < > = values in this interval not displayed.   Cardiac Enzymes No results for input(s): TROPONINI in the last 168 hours. RADIOLOGY:  DG Chest 2 View  Result Date: 12/27/2019 CLINICAL DATA:  Hypertensive urgency EXAM: CHEST - 2 VIEW COMPARISON:  01/22/2019 FINDINGS: Increased interstitial prominence. No pleural effusion. No pneumothorax. Stable cardiomediastinal contours no acute osseous abnormality. IMPRESSION: Increased interstitial prominence, which may reflect mild edema. Electronically Signed   By: Malachi Carl  Amery Minasyan M.D.   On: 12/27/2019 12:05   US Renal  Result Date: 12/27/2019 CLINICAL DATA:  Renal failure. EXAM: RENAL / URINARY TRACT ULTRASOUND COMPLETE COMPARISON:  Abdomen and pelvis CT dated 04/03/2018. Renal ultrasound dated 12/30/2018. FINDINGS: Right Kidney: Renal measurements: 9.58.2 x 5.7 cm = volume: 233 mL. Markedly diffusely echogenic with marked diffuse parenchymal thinning. Interval marked right collecting system dilatation. Left Kidney: Renal measurements: 10.7 x 5.5 x 5.5 cm = volume: 168 mL. Diffusely echogenic with poorly defined margins. Interval marked dilatation of the collecting system. Bladder: Mildly diffusely irregular walls with mild  floating internal debris. Calculated prevoid volume of 411 cc and postvoid volume of 161 cc. Other: None. IMPRESSION: 1. Interval marked bilateral hydronephrosis. 2. Markedly echogenic kidneys compatible with marked chronic medical renal disease. 3. Marked diffuse right renal parenchymal atrophy and no significant left renal parenchymal atrophy. 4. Irregular bladder with a moderate-sized postvoid residual. Electronically Signed   By: Claudie Revering M.D.   On: 12/27/2019 14:11   PERIPHERAL VASCULAR CATHETERIZATION  Result Date: 12/28/2019 See op note  ECHOCARDIOGRAM COMPLETE  Result Date: 12/28/2019    ECHOCARDIOGRAM REPORT   Patient Name:   Anthony Hess Date of Exam: 12/28/2019 Medical Rec #:  [id number]    Height:       62.0 in Accession #:    [id number]   Weight:       137.0 lb Date of Birth:  [birth year]   BSA:          1.628 m Patient Age:    64 years     BP:           157/109 mmHg Patient Gender: M            HR:           69 bpm. Exam Location:  [facility] Procedure: 2D Echo, Color Doppler and Cardiac Doppler Indications:     R06.00 Dyspnea  History:         Patient has prior history of Echocardiogram examinations.                  Previous Myocardial Infarction and CAD, Prior CABG, CKD; Risk                  Factors:Hypertension.  Sonographer:     Charmayne Sheer RDCS (AE) Referring Phys:  [id number] AMY N COX Diagnosing Phys: Isaias Cowman MD IMPRESSIONS  1. Left ventricular ejection fraction, by estimation, is 20 to 25%. The left ventricle has severely decreased function. The left ventricle has no regional wall motion abnormalities. The left ventricular internal cavity size was moderately dilated. Left ventricular diastolic parameters are consistent with Grade I diastolic dysfunction (impaired relaxation).  2. Right ventricular systolic function is normal. The right ventricular size is normal.  3. The mitral valve is normal in structure. Moderate mitral valve regurgitation. No evidence of mitral stenosis.  4.  The aortic valve is normal in structure. Aortic valve regurgitation is mild to moderate. No aortic stenosis is present.  5. The inferior vena cava is normal in size with greater than 50% respiratory variability, suggesting right atrial pressure of 3 mmHg. FINDINGS  Left Ventricle: Left ventricular ejection fraction, by estimation, is 20 to 25%. The left ventricle has severely decreased function. The left ventricle has no regional wall motion abnormalities. The left ventricular internal cavity size was moderately dilated. There is no left ventricular hypertrophy. Left ventricular diastolic parameters are consistent with Grade I diastolic dysfunction (impaired relaxation). Right  Ventricle: The right ventricular size is normal. No increase in right ventricular wall thickness. Right ventricular systolic function is normal. Left Atrium: Left atrial size was normal in size. Right Atrium: Right atrial size was normal in size. Pericardium: There is no evidence of pericardial effusion. Mitral Valve: The mitral valve is normal in structure. Moderate mitral valve regurgitation. No evidence of mitral valve stenosis. MV peak gradient, 4.6 mmHg. The mean mitral valve gradient is 2.0 mmHg. Tricuspid Valve: The tricuspid valve is normal in structure. Tricuspid valve regurgitation is mild . No evidence of tricuspid stenosis. Aortic Valve: The aortic valve is normal in structure. Aortic valve regurgitation is mild to moderate. No aortic stenosis is present. Aortic valve mean gradient measures 4.0 mmHg. Aortic valve peak gradient measures 8.0 mmHg. Aortic valve area, by VTI measures 2.31 cm. Pulmonic Valve: The pulmonic valve was normal in structure. Pulmonic valve regurgitation is not visualized. No evidence of pulmonic stenosis. Aorta: The aortic root is normal in size and structure. Venous: The inferior vena cava is normal in size with greater than 50% respiratory variability, suggesting right atrial pressure of 3 mmHg.  IAS/Shunts: No atrial level shunt detected by color flow Doppler.  LEFT VENTRICLE PLAX 2D LVIDd:         4.52 cm  Diastology LVIDs:         4.17 cm  LV e' medial:    2.94 cm/s LV PW:         1.55 cm  LV E/e' medial:  21.8 LV IVS:        1.04 cm  LV e' lateral:   4.79 cm/s LVOT diam:     1.90 cm  LV E/e' lateral: 13.4 LV SV:         54 LV SV Index:   33 LVOT Area:     2.84 cm  RIGHT VENTRICLE RV Basal diam:  3.11 cm LEFT ATRIUM             Index       RIGHT ATRIUM           Index LA diam:        3.70 cm 2.27 cm/m  RA Area:     10.30 cm LA Vol (A2C):   36.7 ml 22.55 ml/m RA Volume:   23.20 ml  14.25 ml/m LA Vol (A4C):   36.5 ml 22.42 ml/m LA Biplane Vol: 37.0 ml 22.73 ml/m  AORTIC VALVE                   PULMONIC VALVE AV Area (Vmax):    2.05 cm    PV Vmax:       0.98 m/s AV Area (Vmean):   2.27 cm    PV Vmean:      61.000 cm/s AV Area (VTI):     2.31 cm    PV VTI:        0.160 m AV Vmax:           141.00 cm/s PV Peak grad:  3.8 mmHg AV Vmean:          88.300 cm/s PV Mean grad:  2.0 mmHg AV VTI:            0.232 m AV Peak Grad:      8.0 mmHg AV Mean Grad:      4.0 mmHg LVOT Vmax:         102.00 cm/s LVOT Vmean:        70.800 cm/s LVOT  VTI:          0.189 m LVOT/AV VTI ratio: 0.81  AORTA Ao Root diam: 3.60 cm MITRAL VALVE                TRICUSPID VALVE MV Area (PHT): 4.06 cm     TR Peak grad:   22.8 mmHg MV Peak grad:  4.6 mmHg     TR Vmax:        239.00 cm/s MV Mean grad:  2.0 mmHg MV Vmax:       1.07 m/s     SHUNTS MV Vmean:      65.7 cm/s    Systemic VTI:  0.19 m MV Decel Time: 187 msec     Systemic Diam: 1.90 cm MV E velocity: 64.00 cm/s MV A velocity: 104.00 cm/s MV E/A ratio:  0.62 Isaias Cowman MD Electronically signed by Isaias Cowman MD Signature Date/Time: 12/28/2019/1:46:03 PM    Final    ASSESSMENT AND PLAN:   Jedaiah Rathbun is a 64 y.o. male with medical history significant for complicated urologic history, hypertension with history of medication noncompliance, CKD 4, anemia of  chronic disease, heart failure reduced ejection fraction presented to the ED at the urging of his employer for high blood pressure to the 417E systolic.   Chronic Bilateral hydronephrosis and history of bladder cancer status post transurethral resection -Interval marked bilateral hydronephrosis, irregular bladder with moderate sized postvoid residual -Urology consultation with Dr. Diamantina Providence upper appreciated. Per urology no indication for any urology procedure at this time. Recommend controlling blood pressure hydration and train renal function. No rule for acute intervention with ureteral strengths or nephrectomy tube in the setting of chronic bilateral severe Hydroureteronephrosis -patient has history of bladder cancer.  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 Juleen China recommends perm cath placement and start patient on dialysis. -He has discussed with patient's wife whose preference is peritoneal dialysis. PD cath to be placed on Monday by surgery. - -Ultrasound of the renals reviewed. --11/5-- vascular consultation by Dr. Lucky Cowboy for perm cath placement.  Hypocalcemia-suspect secondary to renal disease. -Asymptomatic at this time -Calcium gluconate has been given.t -Nephrology has been consulted and recommends check PTH, PTH related peptide, vitamin D, phosphorus -Recommends start calcium acetate oral 3 times daily with meals  uncontrolled hypertension with hypertensive nephropathy noncompliance to medication -increase-amlodipine 10 mg daily -added metoprolol 50 mg daily  Hyperlipidemia on atorvastatin  Possible acute on chronic heart failure-echo -Elevated BNP -Lasix trial 80 mg IV twice daily-- holding for now given renal failure --hopefully ultrafiltration once dialysis was started  CAD-low clinical suspicion for ACS at this time -No chest pain no EKG changes -Troponin negative  delta -Resumed atorvastatin 40 mg, metoprolol succinate 25 daily, aspirin  DVT prophylaxis: Heparin Code Status: Full code Diet: Cardiac Family Communication: Spouse is aware, updated spouse by dr Juleen China Disposition Plan: Pending clinical course Consults called: Nephrology and urology  Procedures: permcath placement  Status is: Inpatient  Remains inpatient appropriate because:IV treatments appropriate due to intensity of illness or inability to take PO   Dispo: The patient is from: Home              Anticipated d/c is to: Home              Anticipated d/c date is: 3 days              Patient currently is not medically stable  to d/c. patient admitted with elevated creatinine and uncontrolled blood pressure. To be started on hemodialysis. Perm cath placement done today. Patient will need peritoneal dialysis at home at discharge and hence catheter will be placed on Monday by surgery.       TOTAL TIME TAKING CARE OF THIS PATIENT: 25 minutes.  >50% time spent on counselling and coordination of care  Note: This dictation was prepared with Dragon dictation along with smaller phrase technology. Any transcriptional errors that result from this process are unintentional.  Fritzi Mandes M.D    [facility]   CC: Primary care physician; Volney American, PA-CPatient ID: Jens Som, male   DOB: 1955/12/02, 64 y.o.   MRN: [id number]

## 2019-12-29 DIAGNOSIS — N186 End stage renal disease: Secondary | ICD-10-CM | POA: Diagnosis not present

## 2019-12-29 DIAGNOSIS — N179 Acute kidney failure, unspecified: Secondary | ICD-10-CM | POA: Diagnosis not present

## 2019-12-29 DIAGNOSIS — I10 Essential (primary) hypertension: Secondary | ICD-10-CM | POA: Diagnosis not present

## 2019-12-29 DIAGNOSIS — I509 Heart failure, unspecified: Secondary | ICD-10-CM | POA: Diagnosis not present

## 2019-12-29 DIAGNOSIS — N1330 Unspecified hydronephrosis: Secondary | ICD-10-CM | POA: Diagnosis not present

## 2019-12-29 LAB — RENAL FUNCTION PANEL
Albumin: 2.9 g/dL — ABNORMAL LOW (ref 3.5–5.0)
Anion gap: 13 (ref 5–15)
BUN: 63 mg/dL — ABNORMAL HIGH (ref 8–23)
CO2: 25 mmol/L (ref 22–32)
Calcium: 5.9 mg/dL — CL (ref 8.9–10.3)
Chloride: 102 mmol/L (ref 98–111)
Creatinine, Ser: 5.34 mg/dL — ABNORMAL HIGH (ref 0.61–1.24)
GFR, Estimated: 11 mL/min — ABNORMAL LOW (ref >60)
Glucose, Bld: 230 mg/dL — ABNORMAL HIGH (ref 70–99)
Phosphorus: 5.1 mg/dL — ABNORMAL HIGH (ref 2.5–4.6)
Potassium: 3.6 mmol/L (ref 3.5–5.1)
Sodium: 140 mmol/L (ref 135–145)

## 2019-12-29 LAB — CBC
HCT: 31.4 % — ABNORMAL LOW (ref 39.0–52.0)
Hemoglobin: 10.1 g/dL — ABNORMAL LOW (ref 13.0–17.0)
MCH: 28.3 pg (ref 26.0–34.0)
MCHC: 32.2 g/dL (ref 30.0–36.0)
MCV: 88 fL (ref 80.0–100.0)
Platelets: 214 10*3/uL (ref 150–400)
RBC: 3.57 MIL/uL — ABNORMAL LOW (ref 4.22–5.81)
RDW: 16.5 % — ABNORMAL HIGH (ref 11.5–15.5)
WBC: 7.3 10*3/uL (ref 4.0–10.5)
nRBC: 0 % (ref 0.0–0.2)

## 2019-12-29 LAB — GLUCOSE, CAPILLARY: Glucose-Capillary: 104 mg/dL — ABNORMAL HIGH (ref 70–99)

## 2019-12-29 LAB — QUANTIFERON-TB GOLD PLUS: QuantiFERON-TB Gold Plus: NEGATIVE

## 2019-12-29 LAB — QUANTIFERON-TB GOLD PLUS (RQFGPL)
QuantiFERON Mitogen Value: >10 IU/mL
QuantiFERON Nil Value: 0 IU/mL
QuantiFERON TB1 Ag Value: 0 IU/mL
QuantiFERON TB2 Ag Value: 0 IU/mL

## 2019-12-29 LAB — PARATHYROID HORMONE, INTACT (NO CA): PTH: 159 pg/mL — ABNORMAL HIGH (ref 15–65)

## 2019-12-29 MED ORDER — CALCITRIOL 0.25 MCG PO CAPS
0.5000 ug | ORAL_CAPSULE | Freq: Every day | ORAL | Status: DC
  Filled 2019-12-29 (×5): qty 2

## 2019-12-29 MED ORDER — VITAMIN D (ERGOCALCIFEROL) 1.25 MG (50000 UNIT) PO CAPS
50000.0000 [IU] | ORAL_CAPSULE | ORAL | Status: DC
  Filled 2019-12-29: qty 1

## 2019-12-29 MED ORDER — CALCIUM CARBONATE 1250 (500 CA) MG PO TABS
1250.0000 mg | ORAL_TABLET | Freq: Two times a day (BID) | ORAL | Status: DC
  Administered 2019-12-29 – 2019-12-30 (×2): 1250 mg via ORAL
  Filled 2019-12-29 (×8): qty 1

## 2019-12-29 MED ORDER — ACETAMINOPHEN 325 MG PO TABS
650.0000 mg | ORAL_TABLET | Freq: Four times a day (QID) | ORAL | Status: DC | PRN
  Administered 2019-12-29 – 2019-12-30 (×3): 650 mg via ORAL
  Filled 2019-12-29 (×3): qty 2

## 2019-12-29 NOTE — Progress Notes (Signed)
[facility] NAME: Anthony Hess    MR#:  [id number]  DATE OF BIRTH:  September 26, 1955  SUBJECTIVE:  came in with increasing shortness of breath and elevated blood pressure at work. Poor compliance with MD follow-up and medication at home. Continues to smoke heavy. No family in the room  Patient started on hemodialysis tolerating well. No new complaints today. REVIEW OF SYSTEMS:   Review of Systems  Constitutional: Negative for chills, fever and weight loss.  HENT: Negative for ear discharge, ear pain and nosebleeds.   Eyes: Negative for blurred vision, pain and discharge.  Respiratory: Positive for shortness of breath and wheezing. Negative for sputum production and stridor.   Cardiovascular: Negative for chest pain, palpitations, orthopnea and PND.  Gastrointestinal: Negative for abdominal pain, diarrhea, nausea and vomiting.  Genitourinary: Negative for frequency and urgency.  Musculoskeletal: Positive for back pain. Negative for joint pain.  Neurological: Positive for weakness. Negative for sensory change, speech change and focal weakness.  Psychiatric/Behavioral: Negative for depression and hallucinations. The patient is not nervous/anxious.    Tolerating Diet:yes Tolerating PT: not needed  DRUG ALLERGIES:  No Known Allergies  VITALS:  Blood pressure 137/86, pulse 63, temperature 97.6 F (36.4 C), temperature source Oral, resp. rate 20, height 5\' 2"  (1.575 m), weight 57.7 kg, SpO2 98 %.  PHYSICAL EXAMINATION:   Physical Exam  GENERAL:  64 y.o.-year-old patient lying in the bed with no acute distress. Weak, thin HEENT: Head atraumatic, normocephalic. Oropharynx and nasopharynx clear. Poor dentition NECK:  Supple, no jugular venous distention. No thyroid enlargement, no tenderness.  LUNGS: decreased breath sounds bilaterally, no wheezing, rales, rhonchi. No use of accessory muscles of respiration. HD Access perm  cath CARDIOVASCULAR: S1, S2 normal. No murmurs, rubs, or gallops.  ABDOMEN: Soft, nontender, nondistended. Bowel sounds present. No organomegaly or mass.  EXTREMITIES: No cyanosis, clubbing or edema b/l.    NEUROLOGIC: Cranial nerves II through XII are intact. No focal Motor or sensory deficits b/l.   PSYCHIATRIC:  patient is alert and oriented x 3.  SKIN: No obvious rash, lesion, or ulcer.   LABORATORY PANEL:  CBC Recent Labs  Lab 12/29/19 0913  WBC 7.3  HGB 10.1*  HCT 31.4*  PLT 214    Chemistries  Recent Labs  Lab 12/27/19 1108 12/27/19 1756 12/28/19 0524 12/28/19 0524 12/29/19 0913  NA   < >  --  143   < > 140  K   < >  --  4.5   < > 3.6  CL   < >  --  111   < > 102  CO2   < >  --  18*   < > 25  GLUCOSE   < >  --  88   < > 230*  BUN   < >  --  86*   < > 63*  CREATININE   < >  --  6.71*   < > 5.34*  CALCIUM   < >  --  5.8*   < > 5.9*  MG  --  2.1  --   --   --   AST   < >  --  21  --   --   ALT   < >  --  23  --   --   ALKPHOS   < >  --  55  --   --   BILITOT   < >  --  0.5  --   --    < > = values in this interval not displayed.   Cardiac Enzymes No results for input(s): TROPONINI in the last 168 hours. RADIOLOGY:  US Renal  Result Date: 12/27/2019 CLINICAL DATA:  Renal failure. EXAM: RENAL / URINARY TRACT ULTRASOUND COMPLETE COMPARISON:  Abdomen and pelvis CT dated 04/03/2018. Renal ultrasound dated 12/30/2018. FINDINGS: Right Kidney: Renal measurements: 9.58.2 x 5.7 cm = volume: 233 mL. Markedly diffusely echogenic with marked diffuse parenchymal thinning. Interval marked right collecting system dilatation. Left Kidney: Renal measurements: 10.7 x 5.5 x 5.5 cm = volume: 168 mL. Diffusely echogenic with poorly defined margins. Interval marked dilatation of the collecting system. Bladder: Mildly diffusely irregular walls with mild floating internal debris. Calculated prevoid volume of 411 cc and postvoid volume of 161 cc. Other: None. IMPRESSION: 1. Interval marked  bilateral hydronephrosis. 2. Markedly echogenic kidneys compatible with marked chronic medical renal disease. 3. Marked diffuse right renal parenchymal atrophy and no significant left renal parenchymal atrophy. 4. Irregular bladder with a moderate-sized postvoid residual. Electronically Signed   By: Claudie Revering M.D.   On: 12/27/2019 14:11   PERIPHERAL VASCULAR CATHETERIZATION  Result Date: 12/28/2019 See op note  ECHOCARDIOGRAM COMPLETE  Result Date: 12/28/2019    ECHOCARDIOGRAM REPORT   Patient Name:   Anthony Hess Date of Exam: 12/28/2019 Medical Rec #:  [id number]    Height:       62.0 in Accession #:    [id number]   Weight:       137.0 lb Date of Birth:  [birth year]   BSA:          1.628 m Patient Age:    44 years     BP:           157/109 mmHg Patient Gender: M            HR:           69 bpm. Exam Location:  [facility] Procedure: 2D Echo, Color Doppler and Cardiac Doppler Indications:     R06.00 Dyspnea  History:         Patient has prior history of Echocardiogram examinations.                  Previous Myocardial Infarction and CAD, Prior CABG, CKD; Risk                  Factors:Hypertension.  Sonographer:     Charmayne Sheer RDCS (AE) Referring Phys:  [id number] AMY N COX Diagnosing Phys: Isaias Cowman MD IMPRESSIONS  1. Left ventricular ejection fraction, by estimation, is 20 to 25%. The left ventricle has severely decreased function. The left ventricle has no regional wall motion abnormalities. The left ventricular internal cavity size was moderately dilated. Left ventricular diastolic parameters are consistent with Grade I diastolic dysfunction (impaired relaxation).  2. Right ventricular systolic function is normal. The right ventricular size is normal.  3. The mitral valve is normal in structure. Moderate mitral valve regurgitation. No evidence of mitral stenosis.  4. The aortic valve is normal in structure. Aortic valve regurgitation is mild to moderate. No aortic stenosis is present.  5. The inferior  vena cava is normal in size with greater than 50% respiratory variability, suggesting right atrial pressure of 3 mmHg. FINDINGS  Left Ventricle: Left ventricular ejection fraction, by estimation, is 20 to 25%. The left ventricle has severely decreased function. The left ventricle has no regional wall motion abnormalities. The left ventricular internal  cavity size was moderately dilated. There is no left ventricular hypertrophy. Left ventricular diastolic parameters are consistent with Grade I diastolic dysfunction (impaired relaxation). Right Ventricle: The right ventricular size is normal. No increase in right ventricular wall thickness. Right ventricular systolic function is normal. Left Atrium: Left atrial size was normal in size. Right Atrium: Right atrial size was normal in size. Pericardium: There is no evidence of pericardial effusion. Mitral Valve: The mitral valve is normal in structure. Moderate mitral valve regurgitation. No evidence of mitral valve stenosis. MV peak gradient, 4.6 mmHg. The mean mitral valve gradient is 2.0 mmHg. Tricuspid Valve: The tricuspid valve is normal in structure. Tricuspid valve regurgitation is mild . No evidence of tricuspid stenosis. Aortic Valve: The aortic valve is normal in structure. Aortic valve regurgitation is mild to moderate. No aortic stenosis is present. Aortic valve mean gradient measures 4.0 mmHg. Aortic valve peak gradient measures 8.0 mmHg. Aortic valve area, by VTI measures 2.31 cm. Pulmonic Valve: The pulmonic valve was normal in structure. Pulmonic valve regurgitation is not visualized. No evidence of pulmonic stenosis. Aorta: The aortic root is normal in size and structure. Venous: The inferior vena cava is normal in size with greater than 50% respiratory variability, suggesting right atrial pressure of 3 mmHg. IAS/Shunts: No atrial level shunt detected by color flow Doppler.  LEFT VENTRICLE PLAX 2D LVIDd:         4.52 cm  Diastology LVIDs:         4.17 cm   LV e' medial:    2.94 cm/s LV PW:         1.55 cm  LV E/e' medial:  21.8 LV IVS:        1.04 cm  LV e' lateral:   4.79 cm/s LVOT diam:     1.90 cm  LV E/e' lateral: 13.4 LV SV:         54 LV SV Index:   33 LVOT Area:     2.84 cm  RIGHT VENTRICLE RV Basal diam:  3.11 cm LEFT ATRIUM             Index       RIGHT ATRIUM           Index LA diam:        3.70 cm 2.27 cm/m  RA Area:     10.30 cm LA Vol (A2C):   36.7 ml 22.55 ml/m RA Volume:   23.20 ml  14.25 ml/m LA Vol (A4C):   36.5 ml 22.42 ml/m LA Biplane Vol: 37.0 ml 22.73 ml/m  AORTIC VALVE                   PULMONIC VALVE AV Area (Vmax):    2.05 cm    PV Vmax:       0.98 m/s AV Area (Vmean):   2.27 cm    PV Vmean:      61.000 cm/s AV Area (VTI):     2.31 cm    PV VTI:        0.160 m AV Vmax:           141.00 cm/s PV Peak grad:  3.8 mmHg AV Vmean:          88.300 cm/s PV Mean grad:  2.0 mmHg AV VTI:            0.232 m AV Peak Grad:      8.0 mmHg AV Mean Grad:      4.0  mmHg LVOT Vmax:         102.00 cm/s LVOT Vmean:        70.800 cm/s LVOT VTI:          0.189 m LVOT/AV VTI ratio: 0.81  AORTA Ao Root diam: 3.60 cm MITRAL VALVE                TRICUSPID VALVE MV Area (PHT): 4.06 cm     TR Peak grad:   22.8 mmHg MV Peak grad:  4.6 mmHg     TR Vmax:        239.00 cm/s MV Mean grad:  2.0 mmHg MV Vmax:       1.07 m/s     SHUNTS MV Vmean:      65.7 cm/s    Systemic VTI:  0.19 m MV Decel Time: 187 msec     Systemic Diam: 1.90 cm MV E velocity: 64.00 cm/s MV A velocity: 104.00 cm/s MV E/A ratio:  0.62 Isaias Cowman MD Electronically signed by Isaias Cowman MD Signature Date/Time: 12/28/2019/1:46:03 PM    Final    ASSESSMENT AND PLAN:   Anthony Hess is a 64 y.o. male with medical history significant for complicated urologic history, hypertension with history of medication noncompliance, CKD 4, anemia of chronic disease, heart failure reduced ejection fraction presented to the ED at the urging of his employer for high blood pressure to the 741S  systolic.   Chronic Bilateral hydronephrosis and history of bladder cancer status post transurethral resection -Interval marked bilateral hydronephrosis, irregular bladder with moderate sized postvoid residual -Urology consultation with Dr. Diamantina Providence upper appreciated. Per urology no indication for any urology procedure at this time. Recommend controlling blood pressure hydration and train renal function. No rule for acute intervention with ureteral strengths or nephrectomy tube in the setting of chronic bilateral severe Hydroureteronephrosis -patient has history of bladder cancer.  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 UF 500 mL -sats 97 or 98% on room air.  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 Floyce Stakes. [name]  --11/5-- perm cath placement and started patient on dialysis. -He has discussed with patient's wife whose preference is peritoneal dialysis. PD cath to be placed on Monday by surgery. - -Ultrasound of the renals reviewed. --11/6--2nd HD rx  Hypocalcemia-suspect secondary to renal disease. -Asymptomatic at this time -Calcium gluconate has been given. -Recommends start calcium acetate oral 3 times daily with meals  uncontrolled hypertension with hypertensive nephropathy noncompliance to medication -increase-amlodipine 10 mg daily -added metoprolol 50 mg daily  Hyperlipidemia on atorvastatin  CAD -No chest pain no EKG changes -Troponin negative delta -Resumed atorvastatin 40 mg, metoprolol succinate 25 daily, aspirin  DVT prophylaxis: Heparin Code Status: Full code Diet: Cardiac Family Communication: Spouse is aware, updated spouse by dr Juleen China Disposition Plan: Pending clinical course Consults called:  Nephrology and urology  Procedures: permcath placement  Status is: Inpatient  Remains inpatient appropriate because:IV treatments appropriate due to intensity of illness or inability to take PO   Dispo: The patient is from: Home              Anticipated d/c is to: Home              Anticipated d/c date is: 3 days  Patient currently is not medically stable to d/c. patient admitted with elevated creatinine and uncontrolled blood pressure. To be started on hemodialysis. Perm cath placement done today. Patient will need peritoneal dialysis at home at discharge and hence catheter will be placed on Monday by surgery.       TOTAL TIME TAKING CARE OF THIS PATIENT: 25 minutes.  >50% time spent on counselling and coordination of care  Note: This dictation was prepared with Dragon dictation along with smaller phrase technology. Any transcriptional errors that result from this process are unintentional.  Fritzi Mandes M.D    [facility]   CC: Primary care physician; Volney American, PA-CPatient ID: Anthony Hess, male   DOB: Mar 01, 1955, 64 y.o.   MRN: [id number]

## 2019-12-29 NOTE — Progress Notes (Signed)
[facility]  ROUNDING NOTE   Subjective:   [name] was admitted to [facility] on 12/27/2019 for AKI (acute kidney injury) [facility]) [N17.9] Acute renal failure, unspecified acute renal failure type ([place]) [N17.9] Hypertension, unspecified type [I10]  Patient was last seen by nephrology on 03/2018 where his creatinine was 3.02, GFR of 21.    Patient seen during dialysis Tolerating well    HEMODIALYSIS FLOWSHEET:  Blood Flow Rate (mL/min): (P) 250 mL/min Arterial Pressure (mmHg): (P) -90 mmHg Venous Pressure (mmHg): (P) 70 mmHg Transmembrane Pressure (mmHg): (P) 60 mmHg Ultrafiltration Rate (mL/min): (P) 400 mL/min Dialysate Flow Rate (mL/min): (P) 500 ml/min Conductivity: Machine : (P) 13.8 Conductivity: Machine : (P) 13.8 Dialysis Fluid Bolus: Normal Saline Bolus Amount (mL): 250 mL     Objective:  Vital signs in last 24 hours:  Temp:  [97.7 F (36.5 C)-98 F (36.7 C)] 97.7 F (36.5 C) (11/06 0737) Pulse Rate:  [63-75] 70 (11/06 0737) Resp:  [11-22] 17 (11/06 0737) BP: (92-181)/(70-125) 150/114 (11/06 0737) SpO2:  [90 %-99 %] 97 % (11/06 0737) Weight:  [57.7 kg] 57.7 kg (11/06 0601)  Weight change: -5.802 kg Filed Weights   12/27/19 1059 12/28/19 0500 12/29/19 0601  Weight: 63.5 kg 62.1 kg 57.7 kg    Intake/Output: I/O last 3 completed shifts: In: 356.2 [P.O.:120; I.V.:36.2; IV Piggyback:200] Out: 1700 [Urine:1700]   Intake/Output this shift:  No intake/output data recorded.  Physical Exam: General: In no acute distress  Head: Moist oral mucosal membranes  Eyes: Sclerae and conjunctivae clear  Lungs:  Respiration even, unlabored, Wheezes+   Heart: S1S2,no rubs or gallops,Regular  Abdomen:  Soft, nontender, non distended  Extremities: No peripheral edema.  Neurologic: Oriented x 3  Skin: No acute lesions or rashes    Rt IJ PC / Dew/ 45/04/6466    Basic Metabolic Panel: Recent Labs  Lab 12/27/19 1108 12/27/19 1756 12/28/19 0524  NA 141   --  143  K 4.7  --  4.5  CL 109  --  111  CO2 18*  --  18*  GLUCOSE 101*  --  88  BUN 90*  --  86*  CREATININE 6.35*  --  6.71*  CALCIUM 5.3*  --  5.8*  MG  --  2.1  --     Liver Function Tests: Recent Labs  Lab 12/27/19 1108 12/28/19 0524  AST 30 21  ALT 29 23  ALKPHOS 56 55  BILITOT 0.7 0.5  PROT 6.4* 5.5*  ALBUMIN 3.4* 2.9*   No results for input(s): LIPASE, AMYLASE in the last 168 hours. No results for input(s): AMMONIA in the last 168 hours.  CBC: Recent Labs  Lab 12/27/19 1108 12/28/19 0524  WBC 8.7 7.1  NEUTROABS 6.9  --   HGB 9.7* 9.8*  HCT 30.0* 30.8*  MCV 88.0 88.5  PLT 232 235    Cardiac Enzymes: No results for input(s): CKTOTAL, CKMB, CKMBINDEX, TROPONINI in the last 168 hours.  BNP: Invalid input(s): POCBNP  CBG: No results for input(s): GLUCAP in the last 168 hours.  Microbiology: Results for orders placed or performed during the hospital encounter of 12/27/19  Respiratory Panel by RT PCR (Flu A&B, Covid) - Nasopharyngeal Swab     Status: None   Collection Time: 12/27/19 12:29 PM   Specimen: Nasopharyngeal Swab  Result Value Ref Range Status   SARS Coronavirus 2 by RT PCR NEGATIVE NEGATIVE Final    Comment: (NOTE) SARS-CoV-2 target nucleic acids are NOT DETECTED.  The SARS-CoV-2 RNA is generally detectable in upper respiratoy specimens during the acute phase of infection. The lowest concentration of SARS-CoV-2 viral copies this assay can detect is 131 copies/mL. A negative result does not preclude SARS-Cov-2 infection and should not be used as the sole basis for treatment or other patient management decisions. A negative result may occur with  improper specimen collection/handling, submission of specimen other than nasopharyngeal swab, presence of viral mutation(s) within the areas targeted by this assay, and inadequate number of viral copies (<131 copies/mL). A negative result must be combined with clinical observations, patient  history, and epidemiological information. The expected result is Negative.  Fact Sheet for Patients:  PinkCheek.be  Fact Sheet for Healthcare Providers:  GravelBags.it  This test is no t yet approved or cleared by the [place] FDA and  has been authorized for detection and/or diagnosis of SARS-CoV-2 by FDA under an Emergency Use Authorization (EUA). This EUA will remain  in effect (meaning this test can be used) for the duration of the COVID-19 declaration under Section 564(b)(1) of the Act, 21 U.S.C. section 360bbb-3(b)(1), unless the authorization is terminated or revoked sooner.     Influenza A by PCR NEGATIVE NEGATIVE Final   Influenza B by PCR NEGATIVE NEGATIVE Final    Comment: (NOTE) The Xpert Xpress SARS-CoV-2/FLU/RSV assay is intended as an aid in  the diagnosis of influenza from Nasopharyngeal swab specimens and  should not be used as a sole basis for treatment. Nasal washings and  aspirates are unacceptable for Xpert Xpress SARS-CoV-2/FLU/RSV  testing.  Fact Sheet for Patients: PinkCheek.be  Fact Sheet for Healthcare Providers: GravelBags.it  This test is not yet approved or cleared by the [place] FDA and  has been authorized for detection and/or diagnosis of SARS-CoV-2 by  FDA under an Emergency Use Authorization (EUA). This EUA will remain  in effect (meaning this test can be used) for the duration of the  Covid-19 declaration under Section 564(b)(1) of the Act, 21  U.S.C. section 360bbb-3(b)(1), unless the authorization is  terminated or revoked. Performed at [facility], [address] [place], [place] [address]     Coagulation Studies: No results for input(s): LABPROT, INR in the last 72 hours.  Urinalysis: Recent Labs    12/27/19 1756  COLORURINE STRAW*  LABSPEC 1.010  PHURINE 5.0  GLUCOSEU NEGATIVE  HGBUR  NEGATIVE  BILIRUBINUR NEGATIVE  KETONESUR NEGATIVE  PROTEINUR >=300*  NITRITE NEGATIVE  LEUKOCYTESUR NEGATIVE      Imaging: DG Chest 2 View  Result Date: 12/27/2019 CLINICAL DATA:  Hypertensive urgency EXAM: CHEST - 2 VIEW COMPARISON:  01/22/2019 FINDINGS: Increased interstitial prominence. No pleural effusion. No pneumothorax. Stable cardiomediastinal contours no acute osseous abnormality. IMPRESSION: Increased interstitial prominence, which may reflect mild edema. Electronically Signed   By: Macy Mis M.D.   On: 12/27/2019 12:05   US Renal  Result Date: 12/27/2019 CLINICAL DATA:  Renal failure. EXAM: RENAL / URINARY TRACT ULTRASOUND COMPLETE COMPARISON:  Abdomen and pelvis CT dated 04/03/2018. Renal ultrasound dated 12/30/2018. FINDINGS: Right Kidney: Renal measurements: 9.58.2 x 5.7 cm = volume: 233 mL. Markedly diffusely echogenic with marked diffuse parenchymal thinning. Interval marked right collecting system dilatation. Left Kidney: Renal measurements: 10.7 x 5.5 x 5.5 cm = volume: 168 mL. Diffusely echogenic with poorly defined margins. Interval marked dilatation of the collecting system. Bladder: Mildly diffusely irregular walls with mild floating internal debris. Calculated prevoid volume of 411 cc and postvoid volume of 161 cc. Other: None. IMPRESSION:  1. Interval marked bilateral hydronephrosis. 2. Markedly echogenic kidneys compatible with marked chronic medical renal disease. 3. Marked diffuse right renal parenchymal atrophy and no significant left renal parenchymal atrophy. 4. Irregular bladder with a moderate-sized postvoid residual. Electronically Signed   By: Claudie Revering M.D.   On: 12/27/2019 14:11   PERIPHERAL VASCULAR CATHETERIZATION  Result Date: 12/28/2019 See op note  ECHOCARDIOGRAM COMPLETE  Result Date: 12/28/2019    ECHOCARDIOGRAM REPORT   Patient Name:   Anthony Hess Date of Exam: 12/28/2019 Medical Rec #:  [id number]    Height:       62.0 in Accession #:     [id number]   Weight:       137.0 lb Date of Birth:  05/10/1955   BSA:          1.628 m Patient Age:    40 years     BP:           157/109 mmHg Patient Gender: M            HR:           69 bpm. Exam Location:  [facility] Procedure: 2D Echo, Color Doppler and Cardiac Doppler Indications:     R06.00 Dyspnea  History:         Patient has prior history of Echocardiogram examinations.                  Previous Myocardial Infarction and CAD, Prior CABG, CKD; Risk                  Factors:Hypertension.  Sonographer:     Charmayne Sheer RDCS (AE) Referring Phys:  [id number] AMY N COX Diagnosing Phys: Isaias Cowman MD IMPRESSIONS  1. Left ventricular ejection fraction, by estimation, is 20 to 25%. The left ventricle has severely decreased function. The left ventricle has no regional wall motion abnormalities. The left ventricular internal cavity size was moderately dilated. Left ventricular diastolic parameters are consistent with Grade I diastolic dysfunction (impaired relaxation).  2. Right ventricular systolic function is normal. The right ventricular size is normal.  3. The mitral valve is normal in structure. Moderate mitral valve regurgitation. No evidence of mitral stenosis.  4. The aortic valve is normal in structure. Aortic valve regurgitation is mild to moderate. No aortic stenosis is present.  5. The inferior vena cava is normal in size with greater than 50% respiratory variability, suggesting right atrial pressure of 3 mmHg. FINDINGS  Left Ventricle: Left ventricular ejection fraction, by estimation, is 20 to 25%. The left ventricle has severely decreased function. The left ventricle has no regional wall motion abnormalities. The left ventricular internal cavity size was moderately dilated. There is no left ventricular hypertrophy. Left ventricular diastolic parameters are consistent with Grade I diastolic dysfunction (impaired relaxation). Right Ventricle: The right ventricular size is normal. No increase in right  ventricular wall thickness. Right ventricular systolic function is normal. Left Atrium: Left atrial size was normal in size. Right Atrium: Right atrial size was normal in size. Pericardium: There is no evidence of pericardial effusion. Mitral Valve: The mitral valve is normal in structure. Moderate mitral valve regurgitation. No evidence of mitral valve stenosis. MV peak gradient, 4.6 mmHg. The mean mitral valve gradient is 2.0 mmHg. Tricuspid Valve: The tricuspid valve is normal in structure. Tricuspid valve regurgitation is mild . No evidence of tricuspid stenosis. Aortic Valve: The aortic valve is normal in structure. Aortic valve regurgitation is mild to moderate. No aortic stenosis is  present. Aortic valve mean gradient measures 4.0 mmHg. Aortic valve peak gradient measures 8.0 mmHg. Aortic valve area, by VTI measures 2.31 cm. Pulmonic Valve: The pulmonic valve was normal in structure. Pulmonic valve regurgitation is not visualized. No evidence of pulmonic stenosis. Aorta: The aortic root is normal in size and structure. Venous: The inferior vena cava is normal in size with greater than 50% respiratory variability, suggesting right atrial pressure of 3 mmHg. IAS/Shunts: No atrial level shunt detected by color flow Doppler.  LEFT VENTRICLE PLAX 2D LVIDd:         4.52 cm  Diastology LVIDs:         4.17 cm  LV e' medial:    2.94 cm/s LV PW:         1.55 cm  LV E/e' medial:  21.8 LV IVS:        1.04 cm  LV e' lateral:   4.79 cm/s LVOT diam:     1.90 cm  LV E/e' lateral: 13.4 LV SV:         54 LV SV Index:   33 LVOT Area:     2.84 cm  RIGHT VENTRICLE RV Basal diam:  3.11 cm LEFT ATRIUM             Index       RIGHT ATRIUM           Index LA diam:        3.70 cm 2.27 cm/m  RA Area:     10.30 cm LA Vol (A2C):   36.7 ml 22.55 ml/m RA Volume:   23.20 ml  14.25 ml/m LA Vol (A4C):   36.5 ml 22.42 ml/m LA Biplane Vol: 37.0 ml 22.73 ml/m  AORTIC VALVE                   PULMONIC VALVE AV Area (Vmax):    2.05 cm     PV Vmax:       0.98 m/s AV Area (Vmean):   2.27 cm    PV Vmean:      61.000 cm/s AV Area (VTI):     2.31 cm    PV VTI:        0.160 m AV Vmax:           141.00 cm/s PV Peak grad:  3.8 mmHg AV Vmean:          88.300 cm/s PV Mean grad:  2.0 mmHg AV VTI:            0.232 m AV Peak Grad:      8.0 mmHg AV Mean Grad:      4.0 mmHg LVOT Vmax:         102.00 cm/s LVOT Vmean:        70.800 cm/s LVOT VTI:          0.189 m LVOT/AV VTI ratio: 0.81  AORTA Ao Root diam: 3.60 cm MITRAL VALVE                TRICUSPID VALVE MV Area (PHT): 4.06 cm     TR Peak grad:   22.8 mmHg MV Peak grad:  4.6 mmHg     TR Vmax:        239.00 cm/s MV Mean grad:  2.0 mmHg MV Vmax:       1.07 m/s     SHUNTS MV Vmean:      65.7 cm/s    Systemic VTI:  0.19 m MV  Decel Time: 187 msec     Systemic Diam: 1.90 cm MV E velocity: 64.00 cm/s MV A velocity: 104.00 cm/s MV E/A ratio:  0.62 Isaias Cowman MD Electronically signed by Isaias Cowman MD Signature Date/Time: 12/28/2019/1:46:03 PM    Final      Medications:    . amLODipine  10 mg Oral Daily  . aspirin EC  81 mg Oral Daily  . atorvastatin  40 mg Oral Daily  . calcium acetate  1,334 mg Oral TID WC  . Chlorhexidine Gluconate Cloth  6 each Topical Q0600  . heparin  5,000 Units Subcutaneous Q8H  . metoprolol succinate  50 mg Oral Daily     Assessment/ Plan:  [name] is a 64 y.o. white male with hypertension, coronary artery disease, nephrolithiasis, history of bladder cancer who is admitted to [facility] on 12/27/2019 for AKI (acute kidney injury) ([place]) [N17.9] Acute renal failure, unspecified acute renal failure type ([place]) [N17.9] Hypertension, unspecified type [I10]  # ESRD  baseline creatinine of 3.26, GFR of 19 on 06/03/2019.  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 Interval marked bilateral hydronephrosis. 2. Markedly echogenic kidneys compatible with marked chronic medical renal disease. 3. Marked  diffuse right renal parenchymal atrophy and no significant left renal parenchymal atrophy. 4. Irregular bladder with a moderate-sized postvoid residual.  Urology team is not recommending any interventions as Hydroureteonephrosis is chronic   Lab Results  Component Value Date   CREATININE 6.71 (H) 12/28/2019   CREATININE 6.35 (H) 12/27/2019   CREATININE 3.26 (H) 06/03/2019    permcath in place Plan for PD cathter placement on Monday 11/8 by Dr Dahlia Byes   #Chronic Systolic Congestive heart failure: with acute exacerbation -Echocardiogram from December 28, 2019 shows LVEF 20 to 25%, no regional wall motion abnormalities, grade 1 diastolic dysfunction, moderately dilated left ventricular cavity, mild to moderate aortic regurgitation  - Euvolemic at present  #Anemia with kidney disease Lab Results  Component Value Date   HGB 10.1 (L) 12/29/2019    No acute indication for Epogen  #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 Supplement with vitamin D as well as calcitriol Paient is on Phoslo      LOS: 1 Anthony Hess 11/6/20218:42 AM

## 2019-12-29 NOTE — Progress Notes (Signed)
[place] visited pt. per OR for AD education; pt. lying in bed when [facility] arrived; [facility] brought pt. AD paperwork --> pt. will look over documents on his own.  No further needs shared at this time; [facility] remains available as needed.

## 2019-12-29 NOTE — Consult Note (Signed)
Patient ID: Anthony Hess, male   DOB: 01/06/1956, 64 y.o.   MRN: [id number]  HPI Anthony Hess is a 64 y.o. male seen in consultation at the request of Dr. Juleen China, discussed the situation in detail with both Dr. Mackey Birchwood and Dr. [name].  He was admitted and has been managed for bilateral chronic hydronephrosis.  He does have a history of bladder tumor and history significant for coronary artery disease and had an open heart surgery last year at [facility].  He had a recent right permacath placed by Dr. Lucky Cowboy for hemodialysis and has been started on hemodialysis and tolerated this well.  He is very interested in pursuing peritoneal dialysis at home.  He was in dual antiplatelet therapy up to a few days ago.  Since admission he has only been on aspirin.  He says he is able to walk.  He denies any abdominal pain he denies any major abdominal interventions.  CBC shows hemoglobin of 10 normal platelets, BMP shows hyperglycemia with a creatinine of 5.3.  He also had a CT scan from last year that have personally reviewed showing hydronephrosis.  No  findings to contraindicate PD catheter placement      HPI  Past Medical History:  Diagnosis Date  . Bladder tumor   . Cancer [facility])    Bladder  . Chronic kidney disease    renal insufficiency  . Coronary artery disease   . History of kidney stones   . Hypertension   . Myocardial infarction ([place])   . Wears glasses     Past Surgical History:  Procedure Laterality Date  . CORONARY ARTERY BYPASS GRAFT N/A 12/27/2018   Procedure: CORONARY ARTERY BYPASS GRAFTING (CABG) X 4 ON PUMP USING RIGHT & LEFT INTERNAL MAMMARY ARTERY LEFT RADIAL ARTERY ENDOSCOPICALLY HARVESTED;  Surgeon: [name], MD;  Location: [place];  Service: Open Heart Surgery;  Laterality: N/A;  . CYSTOSCOPY W/ RETROGRADES Bilateral 05/15/2019   Procedure: CYSTOSCOPY WITH RETROGRADE PYELOGRAM;  Surgeon: Abbie Sons, MD;  Location: [facility] ORS;  Service: Urology;  Laterality: Bilateral;  .  CYSTOSCOPY WITH BIOPSY N/A 05/15/2019   Procedure: CYSTOSCOPY WITH bladder BIOPSY;  Surgeon: Abbie Sons, MD;  Location: [facility] OR[facility];  Service: Urology;  Laterality: N/A;  . DIALYSIS/PERMA CATHETER INSERTION N/A 12/28/2019   Procedure: DIALYSIS/PERMA CATHETER INSERTION;  Surgeon: Algernon Huxley, MD;  Location: [place] CV LAB;  Service: Cardiovascular;  Laterality: N/A;  . LEFT HEART CATH AND CORONARY ANGIOGRAPHY Left 12/20/2018   Procedure: LEFT HEART CATH AND CORONARY ANGIOGRAPHY;  Surgeon: Isaias Cowman, MD;  Location: [place] [facility];  Service: Cardiovascular;  Laterality: Left;  . RADIAL ARTERY HARVEST Left 12/27/2018   Procedure: ENDOSCOPIC RADIAL ARTERY HARVEST;  Surgeon: [name], MD;  Location: [place];  Service: Open Heart Surgery;  Laterality: Left;  . TEE WITHOUT CARDIOVERSION N/A 12/27/2018   Procedure: TRANSESOPHAGEAL ECHOCARDIOGRAM (TEE);  Surgeon: [name], MD;  Location: [place];  Service: Open Heart Surgery;  Laterality: N/A;  . TUMOR REMOVAL  2019   Bladder    Family History  Family history unknown: Yes    Social History Social History   Tobacco Use  . Smoking status: Current Every Day Smoker    Packs/day: 0.25    Types: Cigarettes    Last attempt to quit: 02/19/2019    Years since quitting: 0.8  . Smokeless tobacco: Never Used  Vaping Use  . Vaping Use: Never used  Substance Use Topics  .  Alcohol use: Never  . Drug use: Never    No Known Allergies  Current Facility-Administered Medications  Medication Dose Route Frequency Provider Last Rate Last Admin  . acetaminophen (TYLENOL) tablet 650 mg  650 mg Oral Q6H PRN Fritzi Mandes, MD      . amLODipine (NORVASC) tablet 10 mg  10 mg Oral Daily Algernon Huxley, MD      . atorvastatin (LIPITOR) tablet 40 mg  40 mg Oral Daily Algernon Huxley, MD   40 mg at 12/28/19 0810  . calcitRIOL (ROCALTROL) capsule 0.5 mcg  0.5 mcg Oral Daily [name], [place], MD      . calcium acetate (PHOSLO) capsule 1,334  mg  1,334 mg Oral TID WC Algernon Huxley, MD   1,334 mg at 12/29/19 1602  . calcium carbonate (OS-CAL - dosed in mg of elemental calcium) tablet 1,250 mg  1,250 mg Oral BID BM Murlean Iba, MD   1,250 mg at 12/29/19 1602  . Chlorhexidine Gluconate Cloth 2 % PADS 6 each  6 each Topical Q0600 Algernon Huxley, MD   6 each at 12/29/19 0603  . heparin injection 5,000 Units  5,000 Units Subcutaneous Q8H Algernon Huxley, MD   5,000 Units at 12/29/19 1347  . hydrALAZINE (APRESOLINE) injection 10 mg  10 mg Intravenous Q6H PRN Algernon Huxley, MD      . metoprolol succinate (TOPROL-XL) 24 hr tablet 50 mg  50 mg Oral Daily Dew, Erskine Squibb, MD      . ondansetron (ZOFRAN) injection 4 mg  4 mg Intravenous Q6H PRN Algernon Huxley, MD      . Vitamin D (Ergocalciferol) (DRISDOL) capsule 50,000 Units  50,000 Units Oral Q7 days Murlean Iba, MD         Review of Systems Full ROS  was asked and was negative except for the information on the HPI  Physical Exam Blood pressure (!) 145/103, pulse 64, temperature 98.1 F (36.7 C), resp. rate 17, height 5\' 2"  (1.575 m), weight 57.7 kg, SpO2 97 %. CONSTITUTIONAL: NAD EYES: Pupils are equal, round, , Sclera are non-icteric. EARS, NOSE, MOUTH AND THROAT: He is wearing a mask hearing is intact to voice. LYMPH NODES:  Lymph nodes in the neck are normal. RESPIRATORY:  Lungs are clear. There is normal respiratory effort, with equal breath sounds bilaterally, and without pathologic use of accessory muscles. CARDIOVASCULAR: Heart is regular without murmurs, gallops, or rubs.  Well-healed midline sternotomy without any instability GI: The abdomen is  soft, nontender, and nondistended. There are no palpable masses. There is no hepatosplenomegaly. There are normal bowel sounds in all quadrants. GU: Rectal deferred.   MUSCULOSKELETAL: Normal muscle strength and tone. No cyanosis or edema.   SKIN: Turgor is good and there are no pathologic skin lesions or ulcers. NEUROLOGIC: Motor and  sensation is grossly normal. Cranial nerves are grossly intact. PSYCH:  Oriented to person, place and time. Affect is normal.  Data Reviewed  I have personally reviewed the patient's imaging, laboratory findings and medical records.    Assessment/Plan 64 year old male with end-stage renal disease coronary artery disease in need for dialysis.  He is currently being hemodialyzed.  He is very interested in pursuing peritoneal dialysis.  I do think that he will be a good candidate for it.  We will tentatively put him on schedule for this coming Monday.  In the interim we will hold aspirin since he was on dual antiplatelet therapy.  We will make him n.p.o.  after midnight on Sunday.  Procedure discussed with the patient in detail.  Risks, benefits and possible applications including but not limited to: Bleeding, infection, bowel injury, catheter dysfunction and chronic pain.  Caroleen Hamman, MD FACS General Surgeon 12/29/2019, 5:51 PM

## 2019-12-30 DIAGNOSIS — N179 Acute kidney failure, unspecified: Secondary | ICD-10-CM | POA: Diagnosis not present

## 2019-12-30 DIAGNOSIS — I509 Heart failure, unspecified: Secondary | ICD-10-CM | POA: Diagnosis not present

## 2019-12-30 DIAGNOSIS — N1330 Unspecified hydronephrosis: Secondary | ICD-10-CM | POA: Diagnosis not present

## 2019-12-30 DIAGNOSIS — I10 Essential (primary) hypertension: Secondary | ICD-10-CM | POA: Diagnosis not present

## 2019-12-30 NOTE — Progress Notes (Signed)
[place] [facility] [place] NAME: Anthony Hess    MR#:  [id number]  DATE OF BIRTH:  Feb 14, 1956  SUBJECTIVE:   Patient started on hemodialysis tolerating well. No new complaints today. Patient has a lot of questions about PD catheter placement, in-home dialysis attempted to answer to my best ability. Called and spoke with patient's wife Danton Clap on the phone. REVIEW OF SYSTEMS:   Review of Systems  Constitutional: Negative for chills, fever and weight loss.  HENT: Negative for ear discharge, ear pain and nosebleeds.   Eyes: Negative for blurred vision, pain and discharge.  Respiratory: Positive for shortness of breath and wheezing. Negative for sputum production and stridor.   Cardiovascular: Negative for chest pain, palpitations, orthopnea and PND.  Gastrointestinal: Negative for abdominal pain, diarrhea, nausea and vomiting.  Genitourinary: Negative for frequency and urgency.  Musculoskeletal: Positive for back pain. Negative for joint pain.  Neurological: Positive for weakness. Negative for sensory change, speech change and focal weakness.  Psychiatric/Behavioral: Negative for depression and hallucinations. The patient is not nervous/anxious.    Tolerating Diet:yes Tolerating PT: not needed  DRUG ALLERGIES:  No Known Allergies  VITALS:  Blood pressure (!) 144/94, pulse 75, temperature 98 F (36.7 C), temperature source Oral, resp. rate 20, height 5\' 2"  (1.575 m), weight 60.5 kg, SpO2 97 %.  PHYSICAL EXAMINATION:   Physical Exam  GENERAL:  64 y.o.-year-old patient lying in the bed with no acute distress. Weak, thin HEENT: Head atraumatic, normocephalic. Oropharynx and nasopharynx clear. Poor dentition NECK:  Supple, no jugular venous distention. No thyroid enlargement, no tenderness.  LUNGS: decreased breath sounds bilaterally, no wheezing, rales, rhonchi. No use of accessory muscles of respiration. HD Access perm cath CARDIOVASCULAR: S1, S2  normal. No murmurs, rubs, or gallops.  ABDOMEN: Soft, nontender, nondistended. Bowel sounds present. No organomegaly or mass.  EXTREMITIES: No cyanosis, clubbing or edema b/l.    NEUROLOGIC: Cranial nerves II through XII are intact. No focal Motor or sensory deficits b/l.   PSYCHIATRIC:  patient is alert and oriented x 3.  SKIN: No obvious rash, lesion, or ulcer.   LABORATORY PANEL:  CBC Recent Labs  Lab 12/29/19 0913  WBC 7.3  HGB 10.1*  HCT 31.4*  PLT 214    Chemistries  Recent Labs  Lab 12/27/19 1108 12/27/19 1756 12/28/19 0524 12/28/19 0524 12/29/19 0913  NA   < >  --  143   < > 140  K   < >  --  4.5   < > 3.6  CL   < >  --  111   < > 102  CO2   < >  --  18*   < > 25  GLUCOSE   < >  --  88   < > 230*  BUN   < >  --  86*   < > 63*  CREATININE   < >  --  6.71*   < > 5.34*  CALCIUM   < >  --  5.8*   < > 5.9*  MG  --  2.1  --   --   --   AST   < >  --  21  --   --   ALT   < >  --  23  --   --   ALKPHOS   < >  --  55  --   --   BILITOT   < >  --  0.5  --   --    < > = values in this interval not displayed.   Cardiac Enzymes No results for input(s): TROPONINI in the last 168 hours. RADIOLOGY:  PERIPHERAL VASCULAR CATHETERIZATION  Result Date: 12/28/2019 See op note  ASSESSMENT AND PLAN:   Anthony Hess is a 64 y.o. male with medical history significant for complicated urologic history, hypertension with history of medication noncompliance, CKD 4, anemia of chronic disease, heart failure reduced ejection fraction presented to the ED at the urging of his employer for high blood pressure to the 563O systolic.   Chronic Bilateral hydronephrosis and history of bladder cancer status post transurethral resection -Interval marked bilateral hydronephrosis, irregular bladder with moderate sized postvoid residual -Urology consultation with Dr. Diamantina Providence upper appreciated. Per urology no indication for any urology procedure at this time. Recommend controlling blood pressure  hydration and train renal function. No rule for acute intervention with ureteral strengths or nephrectomy tube in the setting of chronic bilateral severe Hydroureteronephrosis -patient has history of bladder cancer.  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 UF 500 mL -sats 97 or 98% on room air.  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 Floyce Stakes. [name]  --11/5-- perm cath placement and started patient on dialysis. -He has discussed with patient's wife whose preference is peritoneal dialysis. PD cath to be placed on Monday by surgery. - -Ultrasound of the renals reviewed. --11/6--2nd HD rx -11/7 tolerating HD well --pt to get PD cath tomorrow by Dr Dahlia Byes  Hypocalcemia-suspect secondary to renal disease. -Calcium gluconate has been given. -Recommends start calcium acetate oral 3 times daily with meals  uncontrolled hypertension with hypertensive nephropathy noncompliance to medication -increase-amlodipine 10 mg daily -added metoprolol 50 mg daily  Hyperlipidemia on atorvastatin  CAD -No chest pain no EKG changes -Troponin negative delta -Resumed atorvastatin 40 mg, metoprolol succinate 25 daily, aspirin  DVT prophylaxis: Heparin Code Status: Full code Diet: Cardiac Family Communication: spoke with wife [name] Disposition Plan: Pending clinical course Consults called: Nephrology and urology  Procedures: permcath placement  Status is: Inpatient  Remains inpatient appropriate because:IV treatments appropriate due to intensity of illness or inability to take PO   Dispo: The patient is from: Home              Anticipated d/c is to: Home              Anticipated d/c date is: 3 days              Patient currently is not  medically stable to d/c. Needs HD chair time and PD cath placement (planned for Monday)      TOTAL TIME TAKING CARE OF THIS PATIENT: 25 minutes.  >50% time spent on counselling and coordination of care  Note: This dictation was prepared with Dragon dictation along with smaller phrase technology. Any transcriptional errors that result from this process are unintentional.  Fritzi Mandes M.D    [facility]   CC: Primary care physician; Volney American, PA-CPatient ID: Anthony Hess, male   DOB: [birth year], 64 y.o.   MRN: [id number]

## 2019-12-30 NOTE — Progress Notes (Signed)
[facility]  ROUNDING NOTE   Subjective:   [name] was admitted to [facility] on 12/27/2019 for AKI (acute kidney injury) ([place]) [N17.9] Acute renal failure, unspecified acute renal failure type ([place]) [N17.9] Hypertension, unspecified type [I10]  Resting quietly today. Plan for PD catheter placement tomorrow Denies any nausea vomiting    Objective:  Vital signs in last 24 hours:  Temp:  [97.6 F (36.4 C)-99.1 F (37.3 C)] 98 F (36.7 C) (11/07 0757) Pulse Rate:  [63-95] 75 (11/07 0757) Resp:  [17-20] 20 (11/07 0757) BP: (126-160)/(86-113) 141/99 (11/07 0757) SpO2:  [95 %-100 %] 95 % (11/07 0757) Weight:  [60.5 kg] 60.5 kg (11/07 0447)  Weight change: 2.802 kg Filed Weights   12/28/19 0500 12/29/19 0601 12/30/19 0447  Weight: 62.1 kg 57.7 kg 60.5 kg    Intake/Output: I/O last 3 completed shifts: In: -  Out: 1700 [Urine:1200; Other:500]   Intake/Output this shift:  No intake/output data recorded.  Physical Exam: General: In no acute distress  Head: Moist oral mucosal membranes  Eyes: Sclerae and conjunctivae clear  Lungs:  Respiration even, unlabored,    Heart: S1S2,no rubs or gallops,Regular  Abdomen:  Soft, nontender, non distended  Extremities: No peripheral edema.  Neurologic: Oriented x 3  Skin: No acute lesions or rashes    Rt IJ PC / Dew/ 74/09/2705    Basic Metabolic Panel: Recent Labs  Lab 12/27/19 1108 12/27/19 1756 12/28/19 0524 12/29/19 0913  NA 141  --  143 140  K 4.7  --  4.5 3.6  CL 109  --  111 102  CO2 18*  --  18* 25  GLUCOSE 101*  --  88 230*  BUN 90*  --  86* 63*  CREATININE 6.35*  --  6.71* 5.34*  CALCIUM 5.3*  --  5.8* 5.9*  MG  --  2.1  --   --   PHOS  --   --   --  5.1*    Liver Function Tests: Recent Labs  Lab 12/27/19 1108 12/28/19 0524 12/29/19 0913  AST 30 21  --   ALT 29 23  --   ALKPHOS 56 55  --   BILITOT 0.7 0.5  --   PROT 6.4* 5.5*  --   ALBUMIN 3.4* 2.9* 2.9*   No results for input(s):  LIPASE, AMYLASE in the last 168 hours. No results for input(s): AMMONIA in the last 168 hours.  CBC: Recent Labs  Lab 12/27/19 1108 12/28/19 0524 12/29/19 0913  WBC 8.7 7.1 7.3  NEUTROABS 6.9  --   --   HGB 9.7* 9.8* 10.1*  HCT 30.0* 30.8* 31.4*  MCV 88.0 88.5 88.0  PLT 232 235 214    Cardiac Enzymes: No results for input(s): CKTOTAL, CKMB, CKMBINDEX, TROPONINI in the last 168 hours.  BNP: Invalid input(s): POCBNP  CBG: Recent Labs  Lab 12/29/19 2108  GLUCAP 104*    Microbiology: Results for orders placed or performed during the hospital encounter of 12/27/19  Respiratory Panel by RT PCR (Flu A&B, Covid) - Nasopharyngeal Swab     Status: None   Collection Time: 12/27/19 12:29 PM   Specimen: Nasopharyngeal Swab  Result Value Ref Range Status   SARS Coronavirus 2 by RT PCR NEGATIVE NEGATIVE Final    Comment: (NOTE) SARS-CoV-2 target nucleic acids are NOT DETECTED.  The SARS-CoV-2 RNA is generally detectable in upper respiratoy specimens during the acute phase of infection. The lowest concentration of SARS-CoV-2 viral copies this assay  can detect is 131 copies/mL. A negative result does not preclude SARS-Cov-2 infection and should not be used as the sole basis for treatment or other patient management decisions. A negative result may occur with  improper specimen collection/handling, submission of specimen other than nasopharyngeal swab, presence of viral mutation(s) within the areas targeted by this assay, and inadequate number of viral copies (<131 copies/mL). A negative result must be combined with clinical observations, patient history, and epidemiological information. The expected result is Negative.  Fact Sheet for Patients:  PinkCheek.be  Fact Sheet for Healthcare Providers:  GravelBags.it  This test is no t yet approved or cleared by the [place] FDA and  has been authorized for detection  and/or diagnosis of SARS-CoV-2 by FDA under an Emergency Use Authorization (EUA). This EUA will remain  in effect (meaning this test can be used) for the duration of the COVID-19 declaration under Section 564(b)(1) of the Act, 21 U.S.C. section 360bbb-3(b)(1), unless the authorization is terminated or revoked sooner.     Influenza A by PCR NEGATIVE NEGATIVE Final   Influenza B by PCR NEGATIVE NEGATIVE Final    Comment: (NOTE) The Xpert Xpress SARS-CoV-2/FLU/RSV assay is intended as an aid in  the diagnosis of influenza from Nasopharyngeal swab specimens and  should not be used as a sole basis for treatment. Nasal washings and  aspirates are unacceptable for Xpert Xpress SARS-CoV-2/FLU/RSV  testing.  Fact Sheet for Patients: PinkCheek.be  Fact Sheet for Healthcare Providers: GravelBags.it  This test is not yet approved or cleared by the [place] FDA and  has been authorized for detection and/or diagnosis of SARS-CoV-2 by  FDA under an Emergency Use Authorization (EUA). This EUA will remain  in effect (meaning this test can be used) for the duration of the  Covid-19 declaration under Section 564(b)(1) of the Act, 21  U.S.C. section 360bbb-3(b)(1), unless the authorization is  terminated or revoked. Performed at [facility], [address] [place], [place] [address]     Coagulation Studies: No results for input(s): LABPROT, INR in the last 72 hours.  Urinalysis: Recent Labs    12/27/19 1756  COLORURINE STRAW*  LABSPEC 1.010  PHURINE 5.0  GLUCOSEU NEGATIVE  HGBUR NEGATIVE  BILIRUBINUR NEGATIVE  KETONESUR NEGATIVE  PROTEINUR >=300*  NITRITE NEGATIVE  LEUKOCYTESUR NEGATIVE      Imaging: PERIPHERAL VASCULAR CATHETERIZATION  Result Date: 12/28/2019 See op note    Medications:    . amLODipine  10 mg Oral Daily  . atorvastatin  40 mg Oral Daily  . calcitRIOL  0.5 mcg Oral Daily  .  calcium acetate  1,334 mg Oral TID WC  . calcium carbonate  1,250 mg Oral BID BM  . Chlorhexidine Gluconate Cloth  6 each Topical Q0600  . heparin  5,000 Units Subcutaneous Q8H  . metoprolol succinate  50 mg Oral Daily  . Vitamin D (Ergocalciferol)  50,000 Units Oral Q7 days     Assessment/ Plan:  [name] is a 64 y.o. white male with hypertension, coronary artery disease, nephrolithiasis, history of bladder cancer who is admitted to [facility] on 12/27/2019 for AKI (acute kidney injury) ([place]) [N17.9] Acute renal failure, unspecified acute renal failure type ([place]) [N17.9] Hypertension, unspecified type [I10]  # ESRD  baseline creatinine of 3.26, GFR of 19 on 06/03/2019.  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 Interval marked bilateral hydronephrosis. 2. Markedly echogenic kidneys compatible with marked chronic medical  renal disease. 3. Marked diffuse right renal parenchymal atrophy and no significant left renal parenchymal atrophy. 4. Irregular bladder with a moderate-sized postvoid residual.  Urology team is not recommending any interventions as Hydroureteonephrosis is chronic   Lab Results  Component Value Date   CREATININE 5.34 (H) 12/29/2019   CREATININE 6.71 (H) 12/28/2019   CREATININE 6.35 (H) 12/27/2019    permcath in place Plan for PD cathter placement on Monday 11/8 by Dr Dahlia Byes Awaiting outpatient HD placement Next HD Monday or Tuesday depending on clinical status and outpatient schedule  #Chronic Systolic Congestive heart failure: with acute exacerbation -Echocardiogram from December 28, 2019 shows LVEF 20 to 25%, no regional wall motion abnormalities, grade 1 diastolic dysfunction, moderately dilated left ventricular cavity, mild to moderate aortic regurgitation  - Euvolemic at present  #Anemia with kidney disease Lab Results  Component Value Date   HGB 10.1 (L) 12/29/2019    No acute indication  for Epogen  #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 Supplement with vitamin D as well as calcitriol Paient is on Phoslo      LOS: 2 Anthony Hess 11/7/20211:08 PM

## 2019-12-30 NOTE — Anesthesia Preprocedure Evaluation (Addendum)
Anesthesia Evaluation  Patient identified by MRN, date of birth, ID band Patient awake    Reviewed: Allergy & Precautions, H&P , NPO status , Patient's Chart, lab work & pertinent test results  History of Anesthesia Complications Negative for: history of anesthetic complications  Airway Mallampati: III  TM Distance: >3 FB Neck ROM: limited    Dental  (+) Chipped, Poor Dentition, Missing   Pulmonary shortness of breath and with exertion, COPD, Current Smoker,    + rhonchi  + decreased breath sounds      Cardiovascular hypertension, Pt. on medications (-) angina+ CAD, + Past MI, + CABG and +CHF  Normal cardiovascular exam+ Valvular Problems/Murmurs MR and AI  - Systolic murmurs TTE 49/7/02: 1. Left ventricular ejection fraction, by estimation, is 20 to 25%. The  left ventricle has severely decreased function. The left ventricle has no  regional wall motion abnormalities. The left ventricular internal cavity  size was moderately dilated. Left  ventricular diastolic parameters are consistent with Grade I diastolic  dysfunction (impaired relaxation).  2. Right ventricular systolic function is normal. The right ventricular  size is normal.  3. The mitral valve is normal in structure. Moderate mitral valve  regurgitation. No evidence of mitral stenosis.  4. The aortic valve is normal in structure. Aortic valve regurgitation is  mild to moderate. No aortic stenosis is present.  5. The inferior vena cava is normal in size with greater than 50%  respiratory variability, suggesting right atrial pressure of 3 mmHg.    Neuro/Psych negative neurological ROS  negative psych ROS   GI/Hepatic negative GI ROS, Neg liver ROS, neg GERD  ,  Endo/Other  negative endocrine ROS  Renal/GU CRF and DialysisRenal disease     Musculoskeletal   Abdominal (+) - obese,   Peds  Hematology negative hematology ROS (+)   Anesthesia Other  Findings Past Medical History: No date: Bladder tumor No date: Cancer [facility])     Comment:  Bladder No date: Chronic kidney disease     Comment:  renal insufficiency No date: Coronary artery disease No date: History of kidney stones No date: Hypertension No date: Myocardial infarction ([place]) No date: Wears glasses   Reproductive/Obstetrics                           Anesthesia Physical  Anesthesia Plan  ASA: IV  Anesthesia Plan: General ETT   Post-op Pain Management:    Induction: Intravenous  PONV Risk Score and Plan: Ondansetron, Dexamethasone, Midazolam and Treatment may vary due to age or medical condition  Airway Management Planned: Oral ETT  Additional Equipment:   Intra-op Plan:   Post-operative Plan: Extubation in OR and Possible Post-op intubation/ventilation  Informed Consent: I have reviewed the patients History and Physical, chart, labs and discussed the procedure including the risks, benefits and alternatives for the proposed anesthesia with the patient or authorized representative who has indicated his/her understanding and acceptance.     Dental Advisory Given  Plan Discussed with: Anesthesiologist, CRNA and Surgeon  Anesthesia Plan Comments: (Patient informed that they are higher risk for complications from anesthesia during this procedure due to their medical history.  Patient voiced understanding.  Patient consented for risks of anesthesia including but not limited to:  - adverse reactions to medications - damage to eyes, teeth, lips or other oral mucosa - nerve damage due to positioning  - sore throat or hoarseness - Damage to heart, brain, nerves, lungs, other parts  of body or loss of life  Patient voiced understanding.)        Anesthesia Quick Evaluation

## 2019-12-30 NOTE — Progress Notes (Signed)
[facility]  ROUNDING NOTE   Subjective:   [name] was admitted to [facility] on 12/27/2019 for AKI (acute kidney injury) ([place]) [N17.9] Acute renal failure, unspecified acute renal failure type ([place]) [N17.9] Hypertension, unspecified type [I10]  Patient resting in bed, in no acute distress.  Planning for peritoneal catheter placement tomorrow.Plan reinforced today.Will be NPO tonight after midnight for the procedure.  Objective:  Vital signs in last 24 hours:  Temp:  [97.6 F (36.4 C)-99.1 F (37.3 C)] 98 F (36.7 C) (11/07 0757) Pulse Rate:  [63-95] 75 (11/07 0757) Resp:  [17-20] 20 (11/07 0757) BP: (126-160)/(86-113) 141/99 (11/07 0757) SpO2:  [95 %-100 %] 95 % (11/07 0757) Weight:  [60.5 kg] 60.5 kg (11/07 0447)  Weight change: 2.802 kg Filed Weights   12/28/19 0500 12/29/19 0601 12/30/19 0447  Weight: 62.1 kg 57.7 kg 60.5 kg    Intake/Output: I/O last 3 completed shifts: In: -  Out: 1700 [Urine:1200; Other:500]   Intake/Output this shift:  No intake/output data recorded.  Physical Exam: General: In no acute distress  Head: Moist oral mucosal membranes  Eyes: Sclerae and conjunctivae clear  Lungs:  Respiration even, unlabored  Heart: S1S2,no rubs or gallops,Regular  Abdomen:  Soft, nontender, non distended  Extremities: No peripheral edema.  Neurologic: Oriented x 3  Skin: No acute lesions or rashes   Access  Rt IJ Permcath    Basic Metabolic Panel: Recent Labs  Lab 12/27/19 1108 12/27/19 1756 12/28/19 0524 12/29/19 0913  NA 141  --  143 140  K 4.7  --  4.5 3.6  CL 109  --  111 102  CO2 18*  --  18* 25  GLUCOSE 101*  --  88 230*  BUN 90*  --  86* 63*  CREATININE 6.35*  --  6.71* 5.34*  CALCIUM 5.3*  --  5.8* 5.9*  MG  --  2.1  --   --   PHOS  --   --   --  5.1*    Liver Function Tests: Recent Labs  Lab 12/27/19 1108 12/28/19 0524 12/29/19 0913  AST 30 21  --   ALT 29 23  --   ALKPHOS 56 55  --   BILITOT 0.7 0.5  --    PROT 6.4* 5.5*  --   ALBUMIN 3.4* 2.9* 2.9*   No results for input(s): LIPASE, AMYLASE in the last 168 hours. No results for input(s): AMMONIA in the last 168 hours.  CBC: Recent Labs  Lab 12/27/19 1108 12/28/19 0524 12/29/19 0913  WBC 8.7 7.1 7.3  NEUTROABS 6.9  --   --   HGB 9.7* 9.8* 10.1*  HCT 30.0* 30.8* 31.4*  MCV 88.0 88.5 88.0  PLT 232 235 214    Cardiac Enzymes: No results for input(s): CKTOTAL, CKMB, CKMBINDEX, TROPONINI in the last 168 hours.  BNP: Invalid input(s): POCBNP  CBG: Recent Labs  Lab 12/29/19 2108  GLUCAP 104*    Microbiology: Results for orders placed or performed during the hospital encounter of 12/27/19  Respiratory Panel by RT PCR (Flu A&B, Covid) - Nasopharyngeal Swab     Status: None   Collection Time: 12/27/19 12:29 PM   Specimen: Nasopharyngeal Swab  Result Value Ref Range Status   SARS Coronavirus 2 by RT PCR NEGATIVE NEGATIVE Final    Comment: (NOTE) SARS-CoV-2 target nucleic acids are NOT DETECTED.  The SARS-CoV-2 RNA is generally detectable in upper respiratoy specimens during the acute phase of infection. The lowest concentration  of SARS-CoV-2 viral copies this assay can detect is 131 copies/mL. A negative result does not preclude SARS-Cov-2 infection and should not be used as the sole basis for treatment or other patient management decisions. A negative result may occur with  improper specimen collection/handling, submission of specimen other than nasopharyngeal swab, presence of viral mutation(s) within the areas targeted by this assay, and inadequate number of viral copies (<131 copies/mL). A negative result must be combined with clinical observations, patient history, and epidemiological information. The expected result is Negative.  Fact Sheet for Patients:  PinkCheek.be  Fact Sheet for Healthcare Providers:  GravelBags.it  This test is no t yet approved or  cleared by the [place] FDA and  has been authorized for detection and/or diagnosis of SARS-CoV-2 by FDA under an Emergency Use Authorization (EUA). This EUA will remain  in effect (meaning this test can be used) for the duration of the COVID-19 declaration under Section 564(b)(1) of the Act, 21 U.S.C. section 360bbb-3(b)(1), unless the authorization is terminated or revoked sooner.     Influenza A by PCR NEGATIVE NEGATIVE Final   Influenza B by PCR NEGATIVE NEGATIVE Final    Comment: (NOTE) The Xpert Xpress SARS-CoV-2/FLU/RSV assay is intended as an aid in  the diagnosis of influenza from Nasopharyngeal swab specimens and  should not be used as a sole basis for treatment. Nasal washings and  aspirates are unacceptable for Xpert Xpress SARS-CoV-2/FLU/RSV  testing.  Fact Sheet for Patients: PinkCheek.be  Fact Sheet for Healthcare Providers: GravelBags.it  This test is not yet approved or cleared by the [place] FDA and  has been authorized for detection and/or diagnosis of SARS-CoV-2 by  FDA under an Emergency Use Authorization (EUA). This EUA will remain  in effect (meaning this test can be used) for the duration of the  Covid-19 declaration under Section 564(b)(1) of the Act, 21  U.S.C. section 360bbb-3(b)(1), unless the authorization is  terminated or revoked. Performed at [facility], [address] [place], [place] [address]     Coagulation Studies: No results for input(s): LABPROT, INR in the last 72 hours.  Urinalysis: Recent Labs    12/27/19 1756  COLORURINE STRAW*  LABSPEC 1.010  PHURINE 5.0  GLUCOSEU NEGATIVE  HGBUR NEGATIVE  BILIRUBINUR NEGATIVE  KETONESUR NEGATIVE  PROTEINUR >=300*  NITRITE NEGATIVE  LEUKOCYTESUR NEGATIVE      Imaging: PERIPHERAL VASCULAR CATHETERIZATION  Result Date: 12/28/2019 See op note    Medications:    . amLODipine  10 mg Oral Daily  .  atorvastatin  40 mg Oral Daily  . calcitRIOL  0.5 mcg Oral Daily  . calcium acetate  1,334 mg Oral TID WC  . calcium carbonate  1,250 mg Oral BID BM  . Chlorhexidine Gluconate Cloth  6 each Topical Q0600  . heparin  5,000 Units Subcutaneous Q8H  . metoprolol succinate  50 mg Oral Daily  . Vitamin D (Ergocalciferol)  50,000 Units Oral Q7 days     Assessment/ Plan:  [name] is a 64 y.o. white male with hypertension, coronary artery disease, nephrolithiasis, history of bladder cancer who is admitted to [facility] on 12/27/2019 for AKI (acute kidney injury) ([place]) [N17.9] Acute renal failure, unspecified acute renal failure type ([place]) [N17.9] Hypertension, unspecified type [I10]  # Acute kidney injury with metabolic acidosis on chronic kidney disease stage IV: baseline creatinine of 3.26, GFR of 19 on 06/03/2019.  Acute kidney injury seems secondary to obstructive uropathy versus progression of chronic kidney disease.  Patient started on hemodialysis during this admission, on 12/28/2019 Will plan dialysis again tomorrow Also planning for PD catheter placement tomorrow  # Hypertension Blood Pressure readings were elevated on admission Better controlled now  He is on Amlodipine and Metoprolol  #Anemia with kidney disease Hemoglobin 10.1 No acute indication for Epogen       LOS: 2 Tosca Pletz 11/7/202112:09 PM

## 2019-12-31 ENCOUNTER — Inpatient Hospital Stay: Payer: Medicaid Other | Admitting: Anesthesiology

## 2019-12-31 ENCOUNTER — Encounter
Admission: EM | Disposition: A | Discharge status: Home / Self Care | Payer: Self-pay | Source: Home / Self Care | Attending: Internal Medicine

## 2019-12-31 ENCOUNTER — Ambulatory Visit: Admit: 2019-12-31 | Payer: Medicaid Other | Admitting: Surgery

## 2019-12-31 DIAGNOSIS — N179 Acute kidney failure, unspecified: Secondary | ICD-10-CM | POA: Diagnosis not present

## 2019-12-31 DIAGNOSIS — N1330 Unspecified hydronephrosis: Secondary | ICD-10-CM | POA: Diagnosis not present

## 2019-12-31 DIAGNOSIS — I10 Essential (primary) hypertension: Secondary | ICD-10-CM | POA: Diagnosis not present

## 2019-12-31 DIAGNOSIS — N186 End stage renal disease: Secondary | ICD-10-CM | POA: Diagnosis not present

## 2019-12-31 DIAGNOSIS — I509 Heart failure, unspecified: Secondary | ICD-10-CM | POA: Diagnosis not present

## 2019-12-31 HISTORY — PX: CAPD INSERTION: SHX5233

## 2019-12-31 LAB — PROTEIN ELECTROPHORESIS, SERUM
A/G Ratio: 1.4 (ref 0.7–1.7)
Albumin ELP: 3.1 g/dL (ref 2.9–4.4)
Alpha-1-Globulin: 0.2 g/dL (ref 0.0–0.4)
Alpha-2-Globulin: 0.7 g/dL (ref 0.4–1.0)
Beta Globulin: 0.7 g/dL (ref 0.7–1.3)
Gamma Globulin: 0.6 g/dL (ref 0.4–1.8)
Globulin, Total: 2.2 g/dL (ref 2.2–3.9)
Total Protein ELP: 5.3 g/dL — ABNORMAL LOW (ref 6.0–8.5)

## 2019-12-31 LAB — BASIC METABOLIC PANEL
Anion gap: 11 (ref 5–15)
BUN: 59 mg/dL — ABNORMAL HIGH (ref 8–23)
CO2: 23 mmol/L (ref 22–32)
Calcium: 6.2 mg/dL — CL (ref 8.9–10.3)
Chloride: 108 mmol/L (ref 98–111)
Creatinine, Ser: 5.23 mg/dL — ABNORMAL HIGH (ref 0.61–1.24)
GFR, Estimated: 12 mL/min — ABNORMAL LOW (ref >60)
Glucose, Bld: 92 mg/dL (ref 70–99)
Potassium: 3.7 mmol/L (ref 3.5–5.1)
Sodium: 142 mmol/L (ref 135–145)

## 2019-12-31 LAB — CBC
HCT: 30.1 % — ABNORMAL LOW (ref 39.0–52.0)
Hemoglobin: 9.5 g/dL — ABNORMAL LOW (ref 13.0–17.0)
MCH: 28.4 pg (ref 26.0–34.0)
MCHC: 31.6 g/dL (ref 30.0–36.0)
MCV: 89.9 fL (ref 80.0–100.0)
Platelets: 174 10*3/uL (ref 150–400)
RBC: 3.35 MIL/uL — ABNORMAL LOW (ref 4.22–5.81)
RDW: 16.2 % — ABNORMAL HIGH (ref 11.5–15.5)
WBC: 7 10*3/uL (ref 4.0–10.5)
nRBC: 0 % (ref 0.0–0.2)

## 2019-12-31 LAB — KAPPA/LAMBDA LIGHT CHAINS
Kappa free light chain: 115.7 mg/L — ABNORMAL HIGH (ref 3.3–19.4)
Kappa, lambda light chain ratio: 1.8 — ABNORMAL HIGH (ref 0.26–1.65)
Lambda free light chains: 64.3 mg/L — ABNORMAL HIGH (ref 5.7–26.3)

## 2019-12-31 SURGERY — LAPAROSCOPIC INSERTION CONTINUOUS AMBULATORY PERITONEAL DIALYSIS  (CAPD) CATHETER
Anesthesia: General

## 2019-12-31 MED ORDER — LIDOCAINE HCL (PF) 2 % IJ SOLN
INTRAMUSCULAR | Status: AC
  Filled 2019-12-31: qty 5

## 2019-12-31 MED ORDER — CEFAZOLIN SODIUM-DEXTROSE 2-4 GM/100ML-% IV SOLN
INTRAVENOUS | Status: AC
  Filled 2019-12-31: qty 100

## 2019-12-31 MED ORDER — BUPIVACAINE LIPOSOME 1.3 % IJ SUSP
INTRAMUSCULAR | Status: AC
  Filled 2019-12-31: qty 20

## 2019-12-31 MED ORDER — HYDRALAZINE HCL 50 MG PO TABS
50.0000 mg | ORAL_TABLET | Freq: Three times a day (TID) | ORAL | Status: DC
  Administered 2019-12-31 – 2020-01-01 (×2): 50 mg via ORAL
  Filled 2019-12-31 (×2): qty 1

## 2019-12-31 MED ORDER — PHENYLEPHRINE HCL (PRESSORS) 10 MG/ML IV SOLN
INTRAVENOUS | Status: DC | PRN
  Administered 2019-12-31 (×2): 50 ug via INTRAVENOUS
  Administered 2019-12-31: 100 ug via INTRAVENOUS

## 2019-12-31 MED ORDER — CEFAZOLIN SODIUM-DEXTROSE 2-4 GM/100ML-% IV SOLN
2.0000 g | Freq: Once | INTRAVENOUS | Status: AC
  Administered 2019-12-31: 2 g via INTRAVENOUS

## 2019-12-31 MED ORDER — OXYCODONE HCL 5 MG/5ML PO SOLN: 5.0000 mg | Freq: Once | ORAL | Status: DC | PRN

## 2019-12-31 MED ORDER — DEXAMETHASONE SODIUM PHOSPHATE 10 MG/ML IJ SOLN
INTRAMUSCULAR | Status: DC | PRN
  Administered 2019-12-31: 10 mg via INTRAVENOUS

## 2019-12-31 MED ORDER — SODIUM CHLORIDE 0.9 % IV SOLN: INTRAVENOUS | Status: DC | PRN

## 2019-12-31 MED ORDER — FENTANYL CITRATE (PF) 100 MCG/2ML IJ SOLN
INTRAMUSCULAR | Status: DC | PRN
  Administered 2019-12-31 (×2): 100 ug via INTRAVENOUS

## 2019-12-31 MED ORDER — FENTANYL CITRATE (PF) 100 MCG/2ML IJ SOLN: 25.0000 ug | INTRAMUSCULAR | Status: DC | PRN

## 2019-12-31 MED ORDER — LIDOCAINE HCL (CARDIAC) PF 100 MG/5ML IV SOSY
PREFILLED_SYRINGE | INTRAVENOUS | Status: DC | PRN
  Administered 2019-12-31: 60 mg via INTRAVENOUS

## 2019-12-31 MED ORDER — ETOMIDATE 2 MG/ML IV SOLN
INTRAVENOUS | Status: DC | PRN
  Administered 2019-12-31: 20 mg via INTRAVENOUS

## 2019-12-31 MED ORDER — HEPARIN SODIUM (PORCINE) 10000 UNIT/ML IJ SOLN
INTRAMUSCULAR | Status: AC
  Filled 2019-12-31: qty 1

## 2019-12-31 MED ORDER — FENTANYL CITRATE (PF) 100 MCG/2ML IJ SOLN
INTRAMUSCULAR | Status: AC
  Filled 2019-12-31: qty 2

## 2019-12-31 MED ORDER — CHLORHEXIDINE GLUCONATE 0.12 % MT SOLN
15.0000 mL | Freq: Once | OROMUCOSAL | Status: AC
  Administered 2019-12-31: 15 mL via OROMUCOSAL

## 2019-12-31 MED ORDER — ETOMIDATE 2 MG/ML IV SOLN
INTRAVENOUS | Status: AC
  Filled 2019-12-31: qty 10

## 2019-12-31 MED ORDER — SUGAMMADEX SODIUM 200 MG/2ML IV SOLN
INTRAVENOUS | Status: DC | PRN
  Administered 2019-12-31: 200 mg via INTRAVENOUS

## 2019-12-31 MED ORDER — BUPIVACAINE-EPINEPHRINE (PF) 0.25% -1:200000 IJ SOLN
INTRAMUSCULAR | Status: AC
  Filled 2019-12-31: qty 30

## 2019-12-31 MED ORDER — ROCURONIUM BROMIDE 100 MG/10ML IV SOLN
INTRAVENOUS | Status: DC | PRN
  Administered 2019-12-31: 30 mg via INTRAVENOUS

## 2019-12-31 MED ORDER — BUPIVACAINE-EPINEPHRINE 0.25% -1:200000 IJ SOLN
INTRAMUSCULAR | Status: DC | PRN
  Administered 2019-12-31: 30 mL

## 2019-12-31 MED ORDER — MIDAZOLAM HCL 2 MG/2ML IJ SOLN
INTRAMUSCULAR | Status: DC | PRN
  Administered 2019-12-31 (×2): 1 mg via INTRAVENOUS

## 2019-12-31 MED ORDER — OXYCODONE HCL 5 MG PO TABS: 5.0000 mg | ORAL_TABLET | Freq: Once | ORAL | Status: DC | PRN

## 2019-12-31 MED ORDER — BUPIVACAINE LIPOSOME 1.3 % IJ SUSP
INTRAMUSCULAR | Status: DC | PRN
  Administered 2019-12-31: 20 mL

## 2019-12-31 MED ORDER — ONDANSETRON HCL 4 MG/2ML IJ SOLN
INTRAMUSCULAR | Status: AC
  Filled 2019-12-31: qty 2

## 2019-12-31 MED ORDER — EPHEDRINE SULFATE 50 MG/ML IJ SOLN
INTRAMUSCULAR | Status: DC | PRN
  Administered 2019-12-31 (×2): 20 mg via INTRAVENOUS

## 2019-12-31 MED ORDER — ACETAMINOPHEN 500 MG PO TABS
1000.0000 mg | ORAL_TABLET | Freq: Four times a day (QID) | ORAL | Status: DC
  Administered 2019-12-31 – 2020-01-01 (×2): 1000 mg via ORAL
  Filled 2019-12-31 (×2): qty 2

## 2019-12-31 MED ORDER — HEPARIN SOD (PORK) LOCK FLUSH 10 UNIT/ML IV SOLN
INTRAVENOUS | Status: DC | PRN
  Administered 2019-12-31: 1 mL via INTRAPERITONEAL

## 2019-12-31 MED ORDER — DEXAMETHASONE SODIUM PHOSPHATE 10 MG/ML IJ SOLN
INTRAMUSCULAR | Status: AC
  Filled 2019-12-31: qty 1

## 2019-12-31 MED ORDER — LACTATED RINGERS IV SOLN: INTRAVENOUS | Status: DC | PRN

## 2019-12-31 MED ORDER — MIDAZOLAM HCL 2 MG/2ML IJ SOLN
INTRAMUSCULAR | Status: AC
  Filled 2019-12-31: qty 2

## 2019-12-31 MED ORDER — CHLORHEXIDINE GLUCONATE 0.12 % MT SOLN
OROMUCOSAL | Status: AC
  Filled 2019-12-31: qty 15

## 2019-12-31 MED ORDER — ACETAMINOPHEN 10 MG/ML IV SOLN
INTRAVENOUS | Status: AC
  Filled 2019-12-31: qty 100

## 2019-12-31 MED ORDER — ACETAMINOPHEN 10 MG/ML IV SOLN
INTRAVENOUS | Status: DC | PRN
  Administered 2019-12-31: 1000 mg via INTRAVENOUS

## 2019-12-31 MED ORDER — OXYCODONE HCL 5 MG PO TABS
5.0000 mg | ORAL_TABLET | ORAL | Status: DC | PRN
  Administered 2019-12-31: 5 mg via ORAL
  Filled 2019-12-31: qty 1

## 2019-12-31 SURGICAL SUPPLY — 53 items
ADAPTER CATH DIALYSIS 4X8 IT L (MISCELLANEOUS) ×3 IMPLANT
ADAPTER TITANIUM MEDIONICS (MISCELLANEOUS) IMPLANT
BIOPATCH WHT 1IN DISK W/4.0 H (GAUZE/BANDAGES/DRESSINGS) ×3 IMPLANT
BLADE CLIPPER SURG (BLADE) ×3 IMPLANT
CANISTER SUCT 1200ML W/VALVE (MISCELLANEOUS) ×3 IMPLANT
CATH EXTENDED DIALYSIS (CATHETERS) ×3 IMPLANT
CHLORAPREP W/TINT 26 (MISCELLANEOUS) ×3 IMPLANT
COVER WAND RF STERILE (DRAPES) ×3 IMPLANT
DEFOGGER SCOPE WARMER CLEARIFY (MISCELLANEOUS) ×3 IMPLANT
DERMABOND ADVANCED (GAUZE/BANDAGES/DRESSINGS) ×4
DERMABOND ADVANCED .7 DNX12 (GAUZE/BANDAGES/DRESSINGS) ×2 IMPLANT
ELECT CAUTERY BLADE 6.4 (BLADE) ×3 IMPLANT
ELECT REM PT RETURN 9FT ADLT (ELECTROSURGICAL) ×3
ELECTRODE REM PT RTRN 9FT ADLT (ELECTROSURGICAL) ×1 IMPLANT
GLOVE BIO SURGEON STRL SZ7 (GLOVE) ×3 IMPLANT
GOWN STRL REUS W/ TWL LRG LVL3 (GOWN DISPOSABLE) ×2 IMPLANT
GOWN STRL REUS W/TWL LRG LVL3 (GOWN DISPOSABLE) ×4
GRASPER SUT TROCAR 14GX15 (MISCELLANEOUS) ×3 IMPLANT
IV NS 1000ML (IV SOLUTION) ×2
IV NS 1000ML BAXH (IV SOLUTION) ×1 IMPLANT
KIT TURNOVER KIT A (KITS) ×3 IMPLANT
MANIFOLD NEPTUNE II (INSTRUMENTS) ×3 IMPLANT
MINICAP W/POVIDONE IODINE SOL (MISCELLANEOUS) ×3 IMPLANT
NDL SAFETY ECLIPSE 18X1.5 (NEEDLE) ×1 IMPLANT
NEEDLE HYPO 18GX1.5 SHARP (NEEDLE) ×2
NEEDLE HYPO 22GX1.5 SAFETY (NEEDLE) ×3 IMPLANT
NEEDLE VERESS 14GA 120MM (NEEDLE) ×3 IMPLANT
NS IRRIG 500ML POUR BTL (IV SOLUTION) ×3 IMPLANT
PACK LAP CHOLECYSTECTOMY (MISCELLANEOUS) ×3 IMPLANT
PENCIL ELECTRO HAND CTR (MISCELLANEOUS) ×3 IMPLANT
SCISSORS METZENBAUM CVD 33 (INSTRUMENTS) ×3 IMPLANT
SET CYSTO W/LG BORE CLAMP LF (SET/KITS/TRAYS/PACK) ×3 IMPLANT
SET TRANSFER 6 W/TWIST CLAMP 5 (SET/KITS/TRAYS/PACK) ×3 IMPLANT
SET TUBE SMOKE EVAC HIGH FLOW (TUBING) ×3 IMPLANT
SLEEVE ADV FIXATION 5X100MM (TROCAR) ×3 IMPLANT
SPONGE DRAIN TRACH 4X4 STRL 2S (GAUZE/BANDAGES/DRESSINGS) ×3 IMPLANT
SPONGE LAP 18X18 RF (DISPOSABLE) ×6 IMPLANT
STYLET FALLER (MISCELLANEOUS) ×3 IMPLANT
STYLET FALLER MEDIONICS (MISCELLANEOUS) IMPLANT
SUT ETHILON 3-0 FS-10 30 BLK (SUTURE) ×3
SUT MNCRL 4-0 (SUTURE) ×4
SUT MNCRL 4-0 27XMFL (SUTURE) ×2
SUT VIC AB 3-0 SH 27 (SUTURE) ×2
SUT VIC AB 3-0 SH 27X BRD (SUTURE) ×1 IMPLANT
SUT VICRYL 0 AB UR-6 (SUTURE) ×9 IMPLANT
SUTURE EHLN 3-0 FS-10 30 BLK (SUTURE) ×1 IMPLANT
SUTURE MNCRL 4-0 27XMF (SUTURE) ×2 IMPLANT
SYR 20ML LL LF (SYRINGE) ×3 IMPLANT
SYR 3ML LL SCALE MARK (SYRINGE) ×3 IMPLANT
SYS KII FIOS ACCESS ABD 5X100 (TROCAR) ×3
SYSTEM KII FIOS ACES ABD 5X100 (TROCAR) ×1 IMPLANT
TROCAR BALLN GELPORT 12X130M (ENDOMECHANICALS) IMPLANT
TROCAR XCEL NON-BLD 5MMX100MML (ENDOMECHANICALS) ×3 IMPLANT

## 2019-12-31 NOTE — Anesthesia Procedure Notes (Signed)
Procedure Name: Intubation Date/Time: 12/31/2019 11:39 AM Performed by: Jonna Clark, CRNA Pre-anesthesia Checklist: Patient identified, Patient being monitored, Timeout performed, Emergency Drugs available and Suction available Patient Re-evaluated:Patient Re-evaluated prior to induction Oxygen Delivery Method: Circle system utilized Preoxygenation: Pre-oxygenation with 100% oxygen Induction Type: IV induction Ventilation: Mask ventilation without difficulty Laryngoscope Size: 3 and McGraph Grade View: Grade I Tube type: Oral Tube size: 7.0 mm Number of attempts: 1 Airway Equipment and Method: Stylet Placement Confirmation: ETT inserted through vocal cords under direct vision,  positive ETCO2 and breath sounds checked- equal and bilateral Secured at: 21 cm Tube secured with: Tape Dental Injury: Teeth and Oropharynx as per pre-operative assessment

## 2019-12-31 NOTE — Progress Notes (Signed)
[facility] NAME: Anthony Hess    MR#:  [id number]  DATE OF BIRTH:  [birth year]  SUBJECTIVE:   Patient to get PD catheter placed into night. Tolerating dialysis well. Blood pressure bit on the higher side. No family in the room. Denies any complaints. No shortness of breath. REVIEW OF SYSTEMS:   Review of Systems  Constitutional: Negative for chills, fever and weight loss.  HENT: Negative for ear discharge, ear pain and nosebleeds.   Eyes: Negative for blurred vision, pain and discharge.  Respiratory: Positive for shortness of breath and wheezing. Negative for sputum production and stridor.   Cardiovascular: Negative for chest pain, palpitations, orthopnea and PND.  Gastrointestinal: Negative for abdominal pain, diarrhea, nausea and vomiting.  Genitourinary: Negative for frequency and urgency.  Musculoskeletal: Positive for back pain. Negative for joint pain.  Neurological: Positive for weakness. Negative for sensory change, speech change and focal weakness.  Psychiatric/Behavioral: Negative for depression and hallucinations. The patient is not nervous/anxious.    Tolerating Diet:yes Tolerating PT: not needed  DRUG ALLERGIES:  No Known Allergies  VITALS:  Blood pressure (!) 127/93, pulse 73, temperature 98.1 F (36.7 C), temperature source Tympanic, resp. rate 20, height 5\' 2"  (1.575 m), weight 60.5 kg, SpO2 97 %.  PHYSICAL EXAMINATION:   Physical Exam  GENERAL:  64 y.o.-year-old patient lying in the bed with no acute distress. Weak, thin HEENT: Head atraumatic, normocephalic. Oropharynx and nasopharynx clear. Poor dentition  LUNGS: decreased breath sounds bilaterally, no wheezing, rales, rhonchi. No use of accessory muscles of respiration. HD Access perm cath CARDIOVASCULAR: S1, S2 normal. No murmurs, rubs, or gallops.  ABDOMEN: Soft, nontender, nondistended. Bowel sounds present. No organomegaly or mass.  EXTREMITIES: No  cyanosis, clubbing or edema b/l.    NEUROLOGIC: Cranial nerves II through XII are intact. No focal Motor or sensory deficits b/l.   PSYCHIATRIC:  patient is alert and oriented x 3.  SKIN: No obvious rash, lesion, or ulcer.   LABORATORY PANEL:  CBC Recent Labs  Lab 12/31/19 0506  WBC 7.0  HGB 9.5*  HCT 30.1*  PLT 174    Chemistries  Recent Labs  Lab 12/27/19 1108 12/27/19 1756 12/28/19 0524 12/29/19 0913 12/31/19 0506  NA   < >  --  143   < > 142  K   < >  --  4.5   < > 3.7  CL   < >  --  111   < > 108  CO2   < >  --  18*   < > 23  GLUCOSE   < >  --  88   < > 92  BUN   < >  --  86*   < > 59*  CREATININE   < >  --  6.71*   < > 5.23*  CALCIUM   < >  --  5.8*   < > 6.2*  MG  --  2.1  --   --   --   AST   < >  --  21  --   --   ALT   < >  --  23  --   --   ALKPHOS   < >  --  55  --   --   BILITOT   < >  --  0.5  --   --    < > = values in this interval not displayed.  Cardiac Enzymes No results for input(s): TROPONINI in the last 168 hours. RADIOLOGY:  No results found. ASSESSMENT AND PLAN:   Anthony Hess is a 64 y.o. male with medical history significant for complicated urologic history, hypertension with history of medication noncompliance, CKD 4, anemia of chronic disease, heart failure reduced ejection fraction presented to the ED at the urging of his employer for high blood pressure to the 997F systolic.  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 Floyce Stakes. [name]  --11/5-- perm cath placement and started patient on dialysis. -He has discussed with patient's wife whose preference is peritoneal dialysis. PD cath to be placed on Monday by surgery. - -Ultrasound of the renals reviewed. --11/6--2nd HD rx -11/7 tolerating HD well --11/8 pt to get PD cath by Dr Dahlia Byes  Chronic Bilateral hydronephrosis and history of bladder cancer status post transurethral resection -Interval marked  bilateral hydronephrosis, irregular bladder with moderate sized postvoid residual -Urology consultation with Dr. Diamantina Providence upper appreciated. Per urology no indication for any urology procedure at this time. Recommend controlling blood pressure hydration and train renal function. No rule for acute intervention with ureteral strengths or nephrectomy tube in the setting of chronic bilateral severe Hydroureteronephrosis -patient has history of bladder cancer.  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 UF 500 mL -sats 97 or 98% on room air.  Hypocalcemia-suspect secondary to renal disease. -Calcium gluconate has been given. -Recommends start calcium acetate oral 3 times daily with meals  uncontrolled hypertension with hypertensive nephropathy noncompliance to medication -- amlodipine 10 mg daily, metoprolol 50 mg daily -11/8--added Hydralazine 50 mg tid  Hyperlipidemia on atorvastatin  CAD -No chest pain no EKG changes -Troponin negative delta -Resumed atorvastatin 40 mg, metoprolol succinate , aspirin (on hold)  DVT prophylaxis: Heparin Code Status: Full code Diet: Cardiac Family Communication: spoke with wife [name] Disposition Plan: Pending clinical course Consults called: Nephrology and urology  Procedures: permcath placement  Status is: Inpatient  Remains inpatient appropriate because:IV treatments appropriate due to intensity of illness or inability to take PO   Dispo: The patient is from: Home              Anticipated d/c is to: Home              Anticipated d/c date is: 1-2 days              Patient currently is not medically stable to d/c. Needs HD chair time and PD cath placement (planned for Monday)      TOTAL TIME TAKING CARE OF THIS PATIENT: 25 minutes.  >50% time spent on counselling and  coordination of care  Note: This dictation was prepared with Dragon dictation along with smaller phrase technology. Any transcriptional errors that result from this process are unintentional.  Fritzi Mandes M.D    [facility]   CC: Primary care physician; Volney American, PA-CPatient ID: Anthony Hess, male   DOB: [birth year], 64 y.o.   MRN: [id number]

## 2019-12-31 NOTE — Discharge Instructions (Addendum)
Your dialysis has been set up for Tue-thur-sat by Nephrology. Follow their instructions  Renal diet  Stop smoking

## 2019-12-31 NOTE — Anesthesia Postprocedure Evaluation (Signed)
Anesthesia Post Note  Patient: Anthony Hess  Procedure(s) Performed: LAPAROSCOPIC INSERTION CONTINUOUS AMBULATORY PERITONEAL DIALYSIS  (CAPD) CATHETER (N/A )  Anesthesia Type: General Anesthetic complications: no   No complications documented.   Last Vitals:  Vitals:   12/31/19 1421 12/31/19 1440  BP:  105/78  Pulse:  80  Resp:  16  Temp:  (!) 36.3 C  SpO2: 94% 95%    Last Pain:  Vitals:   12/31/19 1440  TempSrc:   PainSc: 0-No pain                 [name]

## 2019-12-31 NOTE — Op Note (Signed)
Laparoscopic placement of peritoneal Dialysis catheter ( Total three cuffs) Laparoscopic Omentopexy   Pre-operative Diagnosis: ESRD   Post-operative Diagnosis: same     Surgeon: Caroleen Hamman, MD FACS   Anesthesia: Gen. with endotracheal tube      Findings: Catheter within pelvis Good return after infusing Peritoneal cavity ometopexy   Estimated Blood Loss: 5cc              Complications: none     Procedure Details  The patient was seen again in the Holding Room. The benefits, complications, treatment options, and expected outcomes were discussed with the patient. The risks of bleeding, infection, recurrence of symptoms, failure to resolve symptoms, catheter malfunction bowel injury, any of which could require further surgery were reviewed with the patient. The likelihood of improving the patient's symptoms with return to their baseline status is good.  The patient and/or family concurred with the proposed plan, giving informed consent.  The patient was taken to Operating Room, identified and the procedure verified. A Time Out was held and the above information confirmed.   Prior to the induction of general anesthesia, antibiotic prophylaxis was administered. VTE prophylaxis was in place. General endotracheal anesthesia was then administered and tolerated well. After the induction, the abdomen was prepped with Chloraprep and draped in the sterile fashion. The patient was positioned in the supine position.   Periumbilical incision created to the left of the midline.   The anterior rectus fascia identified and incised, rectus muscle identified and retracted laterally, Fascia was elevated and veres needle insertedPneumoperitoneum was obtained w/o hemodynamic changes.  We placed two additional laparoscopic ports under direct visualization, No injuries were visualized Using a port We were able to tunnel into the retro rectus space for about 6 cms, We were able to thread the catheter via the  laparoscopic port within the retrorectus space in the standard fashion.  Under direct visualization we made sure that the coil portion of the catheter layed within the pelvis without any kinks.  With some evidence of epiploic appendicitis within the sigmoid colon that can potentially interfere with the patency of the catheter. I was able to also perform a counterincision in the left subcostal area and Tailored an additional extension of the catheter . The  Extension had 2 cuffs and I was able to connect it together with the titanium connector and reinforced the connection with 3-0 nylon stitch. in the standard fashion.  There was no evidence of any kinks.  The exit site was to the left upper quadrant. We instilled heparinized saline a liter into the pelvis and we had very good return. He did have generous omentum, attention was then turned to the omentum and using a PMI device we were able to perform an omentopexy and tacked the omentum to the abdominal wall using 2 interrupted 2-0 Vicryl sutures in the standard fashion. All skin incisions  were infiltrated with a liposomal Marcaine. 4-0 subcuticular Monocryl was used to close the skin. Dermabond was  applied. Sterile dressing applied to the catheter. The patient was then extubated and brought to the recovery room in stable condition. Sponge, lap, and needle counts were correct at closure and at the conclusion of the case.               Caroleen Hamman, MD, FACS

## 2019-12-31 NOTE — Transfer of Care (Signed)
Immediate Anesthesia Transfer of Care Note  Patient: Anthony Hess  Procedure(s) Performed: LAPAROSCOPIC INSERTION CONTINUOUS AMBULATORY PERITONEAL DIALYSIS  (CAPD) CATHETER (N/A )  Patient Location: PACU  Anesthesia Type:General  Level of Consciousness: sedated  Airway & Oxygen Therapy: Patient Spontanous Breathing and Patient connected to face mask oxygen  Post-op Assessment: Report given to RN  Post vital signs: Reviewed and stable  Last Vitals:  Vitals Value Taken Time  BP 112/67 12/31/19 1334  Temp    Pulse 71 12/31/19 1337  Resp 13 12/31/19 1337  SpO2 97 % 12/31/19 1337  Vitals shown include unvalidated device data.  Last Pain:  Vitals:   12/31/19 1101  TempSrc: Tympanic  PainSc: 0-No pain         Complications: No complications documented.

## 2019-12-31 NOTE — Progress Notes (Signed)
[facility]  ROUNDING NOTE   Subjective:   [name] was admitted to [facility] on 12/27/2019 for AKI (acute kidney injury) ([place]) [N17.9] Acute renal failure, unspecified acute renal failure type ([place]) [N17.9] Hypertension, unspecified type [I10]  Patient is laying in bed, resting. Discussed with patient that he will be getting his catheter placed today and receive dialysis tomorrow, then would likely be able to go home after his session. Patient asked about what he and his wife needed to learn about managing his PD at home, and it was discussed that one of the PD nurses outpatient would contact him or his wife to schedule outpatient PD training in one of the centers.  Patient understood plan and stated he would update his wife about this. Patient denies any nausea, vomiting, hematuria or dysuria. He reports right shoulder pain where the right IJ permcath is located, but otherwise is in no pain/distress.   Objective:  Vital signs in last 24 hours:  Temp:  [97.5 F (36.4 C)-98.5 F (36.9 C)] 98.1 F (36.7 C) (11/08 1101) Pulse Rate:  [69-80] 73 (11/08 1101) Resp:  [16-20] 20 (11/08 1101) BP: (127-173)/(92-117) 127/93 (11/08 1101) SpO2:  [95 %-98 %] 97 % (11/08 1101)  Weight change:  Filed Weights   12/28/19 0500 12/29/19 0601 12/30/19 0447  Weight: 62.1 kg 57.7 kg 60.5 kg    Intake/Output: I/O last 3 completed shifts: In: 3 [P.O.:960] Out: 1150 [Urine:1150]   Intake/Output this shift:  Total I/O In: 188 [I.V.:88; IV Piggyback:100] Out: 510 [Urine:510]  Physical Exam: General: In no acute distress or with gross abnormalities  Head: Moist oral mucosal membranes  Eyes: Sclerae and conjunctivae clear  Lungs:  Respiration even, unlabored, clear to auscultation bilaterally  Heart: S1S2,no rubs or gallops,Regular rate and rhythm   Abdomen:  Soft, nontender, non distended  Extremities: No peripheral edema. Knee length compression stockings on both legs   Neurologic: Oriented x 3  Skin: No acute lesions or rashes    Rt IJ PC / Dew/ 33/09/2503    Basic Metabolic Panel: Recent Labs  Lab 12/27/19 1108 12/27/19 1108 12/27/19 1756 12/28/19 0524 12/29/19 0913 12/31/19 0506  NA 141  --   --  143 140 142  K 4.7  --   --  4.5 3.6 3.7  CL 109  --   --  111 102 108  CO2 18*  --   --  18* 25 23  GLUCOSE 101*  --   --  88 230* 92  BUN 90*  --   --  86* 63* 59*  CREATININE 6.35*  --   --  6.71* 5.34* 5.23*  CALCIUM 5.3*   < >  --  5.8* 5.9* 6.2*  MG  --   --  2.1  --   --   --   PHOS  --   --   --   --  5.1*  --    < > = values in this interval not displayed.    Liver Function Tests: Recent Labs  Lab 12/27/19 1108 12/28/19 0524 12/29/19 0913  AST 30 21  --   ALT 29 23  --   ALKPHOS 56 55  --   BILITOT 0.7 0.5  --   PROT 6.4* 5.5*  --   ALBUMIN 3.4* 2.9* 2.9*   No results for input(s): LIPASE, AMYLASE in the last 168 hours. No results for input(s): AMMONIA in the last 168 hours.  CBC: Recent Labs  Lab  12/27/19 1108 12/28/19 0524 12/29/19 0913 12/31/19 0506  WBC 8.7 7.1 7.3 7.0  NEUTROABS 6.9  --   --   --   HGB 9.7* 9.8* 10.1* 9.5*  HCT 30.0* 30.8* 31.4* 30.1*  MCV 88.0 88.5 88.0 89.9  PLT 232 235 214 174    Cardiac Enzymes: No results for input(s): CKTOTAL, CKMB, CKMBINDEX, TROPONINI in the last 168 hours.  BNP: Invalid input(s): POCBNP  CBG: Recent Labs  Lab 12/29/19 2108  GLUCAP 104*    Microbiology: Results for orders placed or performed during the hospital encounter of 12/27/19  Respiratory Panel by RT PCR (Flu A&B, Covid) - Nasopharyngeal Swab     Status: None   Collection Time: 12/27/19 12:29 PM   Specimen: Nasopharyngeal Swab  Result Value Ref Range Status   SARS Coronavirus 2 by RT PCR NEGATIVE NEGATIVE Final    Comment: (NOTE) SARS-CoV-2 target nucleic acids are NOT DETECTED.  The SARS-CoV-2 RNA is generally detectable in upper respiratoy specimens during the acute phase of infection. The  lowest concentration of SARS-CoV-2 viral copies this assay can detect is 131 copies/mL. A negative result does not preclude SARS-Cov-2 infection and should not be used as the sole basis for treatment or other patient management decisions. A negative result may occur with  improper specimen collection/handling, submission of specimen other than nasopharyngeal swab, presence of viral mutation(s) within the areas targeted by this assay, and inadequate number of viral copies (<131 copies/mL). A negative result must be combined with clinical observations, patient history, and epidemiological information. The expected result is Negative.  Fact Sheet for Patients:  PinkCheek.be  Fact Sheet for Healthcare Providers:  GravelBags.it  This test is no t yet approved or cleared by the [place] FDA and  has been authorized for detection and/or diagnosis of SARS-CoV-2 by FDA under an Emergency Use Authorization (EUA). This EUA will remain  in effect (meaning this test can be used) for the duration of the COVID-19 declaration under Section 564(b)(1) of the Act, 21 U.S.C. section 360bbb-3(b)(1), unless the authorization is terminated or revoked sooner.     Influenza A by PCR NEGATIVE NEGATIVE Final   Influenza B by PCR NEGATIVE NEGATIVE Final    Comment: (NOTE) The Xpert Xpress SARS-CoV-2/FLU/RSV assay is intended as an aid in  the diagnosis of influenza from Nasopharyngeal swab specimens and  should not be used as a sole basis for treatment. Nasal washings and  aspirates are unacceptable for Xpert Xpress SARS-CoV-2/FLU/RSV  testing.  Fact Sheet for Patients: PinkCheek.be  Fact Sheet for Healthcare Providers: GravelBags.it  This test is not yet approved or cleared by the [place] FDA and  has been authorized for detection and/or diagnosis of SARS-CoV-2 by  FDA under  an Emergency Use Authorization (EUA). This EUA will remain  in effect (meaning this test can be used) for the duration of the  Covid-19 declaration under Section 564(b)(1) of the Act, 21  U.S.C. section 360bbb-3(b)(1), unless the authorization is  terminated or revoked. Performed at [facility], [address] [place], [place] [address]     Coagulation Studies: No results for input(s): LABPROT, INR in the last 72 hours.  Urinalysis: No results for input(s): COLORURINE, LABSPEC, PHURINE, GLUCOSEU, HGBUR, BILIRUBINUR, KETONESUR, PROTEINUR, UROBILINOGEN, NITRITE, LEUKOCYTESUR in the last 72 hours.  Invalid input(s): APPERANCEUR    Imaging: No results found.   Medications:   .  ceFAZolin (ANCEF) IV     . [MAR Hold] amLODipine  10 mg Oral Daily  . [  MAR Hold] atorvastatin  40 mg Oral Daily  . [MAR Hold] calcitRIOL  0.5 mcg Oral Daily  . [MAR Hold] calcium acetate  1,334 mg Oral TID WC  . [MAR Hold] calcium carbonate  1,250 mg Oral BID BM  . chlorhexidine      . [MAR Hold] Chlorhexidine Gluconate Cloth  6 each Topical Q0600  . [MAR Hold] heparin  5,000 Units Subcutaneous Q8H  . [MAR Hold] hydrALAZINE  50 mg Oral Q8H  . [MAR Hold] metoprolol succinate  50 mg Oral Daily  . [MAR Hold] Vitamin D (Ergocalciferol)  50,000 Units Oral Q7 days     Assessment/ Plan:  [name] is a 64 y.o. white male with hypertension, coronary artery disease, nephrolithiasis, history of bladder cancer who is admitted to [facility] on 12/27/2019 for AKI (acute kidney injury) ([place]) [N17.9] Acute renal failure, unspecified acute renal failure type ([place]) [N17.9] Hypertension, unspecified type [I10]  # ESRD  baseline creatinine of 3.26, GFR of 19 on 06/03/2019.  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 Interval marked bilateral hydronephrosis. 2. Markedly echogenic kidneys compatible with marked chronic medical renal disease. 3. Marked  diffuse right renal parenchymal atrophy and no significant left renal parenchymal atrophy. 4. Irregular bladder with a moderate-sized postvoid residual.  Urology team is not recommending any interventions as Hydroureteonephrosis is chronic   Lab Results  Component Value Date   CREATININE 5.23 (H) 12/31/2019   CREATININE 5.34 (H) 12/29/2019   CREATININE 6.71 (H) 12/28/2019    Right IJ permcath in place Plan for PD cathter placement today by Dr Dahlia Byes Awaiting outpatient HD placement Next HD planned for tomorrow, Tuesday  #Chronic Systolic Congestive heart failure: with acute exacerbation -Echocardiogram from December 28, 2019 shows LVEF 20 to 25%, no regional wall motion abnormalities, grade 1 diastolic dysfunction, moderately dilated left ventricular cavity, mild to moderate aortic regurgitation  - Euvolemic at present  #Anemia with kidney disease Lab Results  Component Value Date   HGB 9.5 (L) 12/31/2019    - No acute indication for Epogen  #Hypocalcemia in setting of secondary hyperparathyroidism Lab Results  Component Value Date   PTH 159 (H) 12/27/2019   CALCIUM 6.2 (LL) 12/31/2019   CAION 1.07 (L) 05/15/2019   PHOS 5.1 (H) 12/29/2019  Low calcium is noted. Supplement with vitamin D as well as calcitriol Paient is on Phoslo TID     LOS: 3 Cassandra Gonzalez-Hernandez 11/8/202112:07 PM  Patient was seen and evaluated with [place] PA student, Cassandra Hernandez-Gonzalez.  She assisted with transcription of the note.  I agree with the note as documented.  Murlean Iba, MD [facility]  11/8/20213:28 PM

## 2020-01-01 ENCOUNTER — Encounter: Payer: Self-pay | Admitting: Surgery

## 2020-01-01 DIAGNOSIS — I5022 Chronic systolic (congestive) heart failure: Secondary | ICD-10-CM

## 2020-01-01 DIAGNOSIS — I5023 Acute on chronic systolic (congestive) heart failure: Secondary | ICD-10-CM | POA: Diagnosis not present

## 2020-01-01 DIAGNOSIS — N1330 Unspecified hydronephrosis: Secondary | ICD-10-CM | POA: Diagnosis not present

## 2020-01-01 DIAGNOSIS — I10 Essential (primary) hypertension: Secondary | ICD-10-CM | POA: Diagnosis not present

## 2020-01-01 DIAGNOSIS — I5042 Chronic combined systolic (congestive) and diastolic (congestive) heart failure: Secondary | ICD-10-CM

## 2020-01-01 LAB — PROTEIN ELECTRO, RANDOM URINE
Albumin ELP, Urine: 67 %
Alpha-1-Globulin, U: 4.7 %
Alpha-2-Globulin, U: 7.9 %
Beta Globulin, U: 11.3 %
Gamma Globulin, U: 9.1 %
Total Protein, Urine: 294 mg/dL

## 2020-01-01 LAB — PTH-RELATED PEPTIDE: PTH-related peptide: <2 pmol/L

## 2020-01-01 MED ORDER — CLOPIDOGREL BISULFATE 75 MG PO TABS
75.0000 mg | ORAL_TABLET | Freq: Every day | ORAL | 3 refills | Status: DC
Start: 2020-01-01 — End: 2020-04-09

## 2020-01-01 MED ORDER — CALCITRIOL 0.5 MCG PO CAPS
0.5000 ug | ORAL_CAPSULE | Freq: Every day | ORAL | 1 refills | Status: DC
Start: 2020-01-01 — End: 2021-04-17

## 2020-01-01 MED ORDER — CALCIUM CARBONATE 1250 (500 CA) MG PO TABS
1250.0000 mg | ORAL_TABLET | Freq: Two times a day (BID) | ORAL | 1 refills | Status: DC
Start: 2020-01-01 — End: 2021-09-10

## 2020-01-01 MED ORDER — ATORVASTATIN CALCIUM 40 MG PO TABS
40.0000 mg | ORAL_TABLET | Freq: Every day | ORAL | 1 refills | Status: DC
Start: 2020-01-01 — End: 2021-11-18

## 2020-01-01 MED ORDER — CALCIUM ACETATE (PHOS BINDER) 667 MG PO CAPS
1334.0000 mg | ORAL_CAPSULE | Freq: Three times a day (TID) | ORAL | 1 refills | Status: DC
Start: 2020-01-01 — End: 2021-03-16

## 2020-01-01 MED ORDER — METOPROLOL SUCCINATE ER 50 MG PO TB24
50.0000 mg | ORAL_TABLET | Freq: Every day | ORAL | 1 refills | Status: DC
Start: 2020-01-01 — End: 2021-04-17

## 2020-01-01 MED ORDER — AMLODIPINE BESYLATE 10 MG PO TABS
10.0000 mg | ORAL_TABLET | Freq: Every day | ORAL | 1 refills | Status: DC
Start: 2020-01-01 — End: 2020-12-26

## 2020-01-01 MED ORDER — VITAMIN D (ERGOCALCIFEROL) 1.25 MG (50000 UNIT) PO CAPS
50000.0000 [IU] | ORAL_CAPSULE | ORAL | 1 refills | Status: DC
Start: 2020-01-05 — End: 2021-11-18

## 2020-01-01 MED ORDER — HYDRALAZINE HCL 50 MG PO TABS
50.0000 mg | ORAL_TABLET | Freq: Three times a day (TID) | ORAL | 1 refills | Status: DC
Start: 2020-01-01 — End: 2020-01-01

## 2020-01-01 NOTE — Discharge Summary (Addendum)
[facility] NAME: Anthony Hess    MR#:  [id number]  DATE OF BIRTH:  18-Jun-1955  DATE OF ADMISSION:  12/27/2019 ADMITTING PHYSICIAN: Fritzi Mandes, MD  DATE OF DISCHARGE: 01/01/2020  PRIMARY CARE PHYSICIAN: Volney American, PA-C    ADMISSION DIAGNOSIS:  AKI (acute kidney injury) ([place]) [N17.9] Acute renal failure, unspecified acute renal failure type ([place]) [N17.9] Hypertension, unspecified type [I10]  DISCHARGE DIAGNOSIS:  ESRD now started on Hemodialysis Malignant HTN Severe Cardiomyopathy with Acute on Chronic CHF systolic  SECONDARY DIAGNOSIS:   Past Medical History:  Diagnosis Date  . Bladder tumor   . Cancer [facility])    Bladder  . Chronic kidney disease    renal insufficiency  . Coronary artery disease   . History of kidney stones   . Hypertension   . Myocardial infarction ([place])   . Wears glasses     HOSPITAL COURSE:   Anthony Hess a 64 y.o.malewith medical history significant forcomplicated urologichistory,hypertension with history of medication noncompliance, CKD 4, anemia of chronic disease, heart failure reduced ejection fraction presented to the ED at the urging of his employer for high blood pressure to the 885O systolic.  Acute on chronic kidney disease stage V-- now has  end-stage renal disease requiring HD --Nephrology has been consulted-- Dr. Floyce Stakes. [name]  --11/5-- perm cath placement and started patient on dialysis. -He has discussed with patient's wife whose preference is peritoneal dialysis. PD cath to be placed on Monday by surgery. - -Ultrasound of the renals reviewed. --11/6--2nd HD rx -11/7 tolerating HD well --11/8 s/p PD cath by Dr Dahlia Byes --11/9-- pt to get HD today and then d/c to home--ok with nephrology  Chronic Bilateral hydronephrosis and history ofbladder cancer status post transurethral resection -Urology consultation with Dr. Diamantina Providence upper appreciated. Per urology no  indication for any urology procedure at this time. Recommend controlling blood pressure hydration and train renal function. No rule for acute intervention with ureteral strengths or nephrectomy tube in the setting of chronic bilateral severe Hydroureteronephrosis -patient has history of bladder cancer. --f/u urology  Acute on chronicheart systolic failure --echo shows EF of 20 to 25% with severe cardiomyopathy -Elevated BNP -Lasix trial 80 mg IV twice daily- x 2 doses -- started now on dialysis. UF 500 mL -sats 97 or 98% on room air.Euvolemic  Hypocalcemia-suspect secondary to renal disease. -Calcium gluconate has been given. -Recommends start calcium acetate oral 3 times daily with meals  uncontrolled hypertension with hypertensive nephropathy noncompliance to medication -- amlodipine 10 mg daily, metoprolol 50 mg daily --11/8--added Hydralazine 50 mg tid --11/9--d/c hydralazine due to low/stable bp  Hyperlipidemia on atorvastatin  CAD -No chest pain no EKG changes -Troponin negative delta -Resumed atorvastatin 40 mg, metoprolol succinate , aspirin and plavix          DVT prophylaxis:Heparin Code Status:Full code Diet:Cardiac Family Communication:spoke with wife Anthony Hess Disposition Plan:d/c to home after HD today Consults called:Nephrology, surgery and urology  Procedures: permcath placement , PD cath placement Status is: Inpatient  Dispo: The patient is from: Home  Anticipated d/c is to: Home  Anticipated d/c date YD:XAJOI  Patient currently is medically stable to d/c.  Pt has TTS chair time for HD in [place], [place]. Pt and wife aware   CONSULTS OBTAINED:  Treatment Team:  Billey Co, MD  DRUG ALLERGIES:  No Known Allergies  DISCHARGE MEDICATIONS:   Allergies as of 01/01/2020   No Known Allergies  Medication List    STOP taking these medications   amiodarone 200 MG tablet Commonly known as:  PACERONE   Probiotic 250 MG Caps     TAKE these medications   amLODipine 10 MG tablet Commonly known as: NORVASC Take 1 tablet (10 mg total) by mouth daily. What changed:  medication strength how much to take   aspirin EC 81 MG tablet Take 81 mg by mouth daily.   atorvastatin 40 MG tablet Commonly known as: LIPITOR Take 1 tablet (40 mg total) by mouth daily.   calcitRIOL 0.5 MCG capsule Commonly known as: ROCALTROL Take 1 capsule (0.5 mcg total) by mouth daily.   calcium acetate 667 MG capsule Commonly known as: PHOSLO Take 2 capsules (1,334 mg total) by mouth 3 (three) times daily with meals.   calcium carbonate 1250 (500 Ca) MG tablet Commonly known as: OS-CAL - dosed in mg of elemental calcium Take 1 tablet (1,250 mg total) by mouth 2 (two) times daily between meals.   clopidogrel 75 MG tablet Commonly known as: PLAVIX Take 1 tablet (75 mg total) by mouth daily.   hydrALAZINE 50 MG tablet Commonly known as: APRESOLINE Take 1 tablet (50 mg total) by mouth every 8 (eight) hours.   metoprolol succinate 50 MG 24 hr tablet Commonly known as: TOPROL-XL Take 1 tablet (50 mg total) by mouth daily. Take with or immediately following a meal. What changed:  medication strength how much to take additional instructions   traMADol 50 MG tablet Commonly known as: ULTRAM Take 50 mg by mouth 2 (two) times daily as needed (pain.).   Vitamin D (Ergocalciferol) 1.25 MG (50000 UNIT) Caps capsule Commonly known as: DRISDOL Take 1 capsule (50,000 Units total) by mouth every 7 (seven) days. Start taking on: January 05, 2020            Discharge Care Instructions  (From admission, onward)         Start     Ordered   01/01/20 0000  Discharge wound care:       Comments: Continue with current abdominal dressing. Change if needed   01/01/20 0819          If you experience worsening of your admission symptoms, develop shortness of breath, life threatening emergency,  suicidal or homicidal thoughts you must seek medical attention immediately by calling 911 or calling your MD immediately  if symptoms less severe.  You Must read complete instructions/literature along with all the possible adverse reactions/side effects for all the Medicines you take and that have been prescribed to you. Take any new Medicines after you have completely understood and accept all the possible adverse reactions/side effects.   Please note  You were cared for by a hospitalist during your hospital stay. If you have any questions about your discharge medications or the care you received while you were in the hospital after you are discharged, you can call the unit and asked to speak with the hospitalist on call if the hospitalist that took care of you is not available. Once you are discharged, your primary care physician will handle any further medical issues. Please note that NO REFILLS for any discharge medications will be authorized once you are discharged, as it is imperative that you return to your primary care physician (or establish a relationship with a primary care physician if you do not have one) for your aftercare needs so that they can reassess your need for medications and monitor your lab values. Today  SUBJECTIVE   Doing well. Anxious to go home  VITAL SIGNS:  Blood pressure 103/64, pulse 66, temperature 98.2 F (36.8 C), temperature source Oral, resp. rate 17, height 5\' 2"  (1.575 m), weight 60.5 kg, SpO2 96 %.  I/O:    Intake/Output Summary (Last 24 hours) at 01/01/2020 0821 Last data filed at 12/31/2019 2358 Gross per 24 hour  Intake 413 ml  Output 440 ml  Net -27 ml    PHYSICAL EXAMINATION:  GENERAL:  64 y.o.-year-old patient lying in the bed with no acute distress. thin HEENT: Head atraumatic, normocephalic. Oropharynx and nasopharynx clear. Poor dentition  LUNGS: decreased breath sounds bilaterally, no wheezing, rales, rhonchi. No use of accessory muscles  of respiration. HD Access perm cath CARDIOVASCULAR: S1, S2 normal. No murmurs, rubs, or gallops.  ABDOMEN: Soft, nontender, nondistended. Bowel sounds present. No organomegaly or mass. PD cath+ EXTREMITIES: No cyanosis, clubbing or edema b/l.    NEUROLOGIC: Cranial nerves II through XII are intact. No focal Motor or sensory deficits b/l.   PSYCHIATRIC:  patient is alert and oriented x 3.  SKIN: No obvious rash, lesion, or ulcer.  DATA REVIEW:   CBC  Recent Labs  Lab 12/31/19 0506  WBC 7.0  HGB 9.5*  HCT 30.1*  PLT 174    Chemistries  Recent Labs  Lab 12/27/19 1108 12/27/19 1756 12/28/19 0524 12/29/19 0913 12/31/19 0506  NA   < >  --  143   < > 142  K   < >  --  4.5   < > 3.7  CL   < >  --  111   < > 108  CO2   < >  --  18*   < > 23  GLUCOSE   < >  --  88   < > 92  BUN   < >  --  86*   < > 59*  CREATININE   < >  --  6.71*   < > 5.23*  CALCIUM   < >  --  5.8*   < > 6.2*  MG  --  2.1  --   --   --   AST   < >  --  21  --   --   ALT   < >  --  23  --   --   ALKPHOS   < >  --  55  --   --   BILITOT   < >  --  0.5  --   --    < > = values in this interval not displayed.    Microbiology Results   Recent Results (from the past 240 hour(s))  Respiratory Panel by RT PCR (Flu A&B, Covid) - Nasopharyngeal Swab     Status: None   Collection Time: 12/27/19 12:29 PM   Specimen: Nasopharyngeal Swab  Result Value Ref Range Status   SARS Coronavirus 2 by RT PCR NEGATIVE NEGATIVE Final    Comment: (NOTE) SARS-CoV-2 target nucleic acids are NOT DETECTED.  The SARS-CoV-2 RNA is generally detectable in upper respiratoy specimens during the acute phase of infection. The lowest concentration of SARS-CoV-2 viral copies this assay can detect is 131 copies/mL. A negative result does not preclude SARS-Cov-2 infection and should not be used as the sole basis for treatment or other patient management decisions. A negative result may occur with  improper specimen collection/handling,  submission of specimen other than nasopharyngeal swab, presence of viral mutation(s) within the  areas targeted by this assay, and inadequate number of viral copies (<131 copies/mL). A negative result must be combined with clinical observations, patient history, and epidemiological information. The expected result is Negative.  Fact Sheet for Patients:  PinkCheek.be  Fact Sheet for Healthcare Providers:  GravelBags.it  This test is no t yet approved or cleared by the [place] FDA and  has been authorized for detection and/or diagnosis of SARS-CoV-2 by FDA under an Emergency Use Authorization (EUA). This EUA will remain  in effect (meaning this test can be used) for the duration of the COVID-19 declaration under Section 564(b)(1) of the Act, 21 U.S.C. section 360bbb-3(b)(1), unless the authorization is terminated or revoked sooner.     Influenza A by PCR NEGATIVE NEGATIVE Final   Influenza B by PCR NEGATIVE NEGATIVE Final    Comment: (NOTE) The Xpert Xpress SARS-CoV-2/FLU/RSV assay is intended as an aid in  the diagnosis of influenza from Nasopharyngeal swab specimens and  should not be used as a sole basis for treatment. Nasal washings and  aspirates are unacceptable for Xpert Xpress SARS-CoV-2/FLU/RSV  testing.  Fact Sheet for Patients: PinkCheek.be  Fact Sheet for Healthcare Providers: GravelBags.it  This test is not yet approved or cleared by the [place] FDA and  has been authorized for detection and/or diagnosis of SARS-CoV-2 by  FDA under an Emergency Use Authorization (EUA). This EUA will remain  in effect (meaning this test can be used) for the duration of the  Covid-19 declaration under Section 564(b)(1) of the Act, 21  U.S.C. section 360bbb-3(b)(1), unless the authorization is  terminated or revoked. Performed at [facility], [address] [place], [place] [address]     RADIOLOGY:  No results found.   CODE STATUS:     Code Status Orders  (From admission, onward)         Start     Ordered   12/27/19 1422  Full code  Continuous        12/27/19 1422        Code Status History    Date Active Date Inactive Code Status Order ID Comments User Context   12/27/2018 1512 01/05/2019 1734 Full Code [id number]  Marcene Corning Inpatient   12/20/2018 1040 12/20/2018 1618 Full Code [id number]  Isaias Cowman, MD Inpatient   04/03/2018 1447 04/05/2018 1952 Full Code [id number]  Demetrios Loll, MD Inpatient   Advance Care Planning Activity       TOTAL TIME TAKING CARE OF THIS PATIENT: *35* minutes.    Fritzi Mandes M.D  [facility]    CC: Primary care physician; Volney American, [place]

## 2020-01-01 NOTE — Plan of Care (Signed)
  Problem: Health Behavior/Discharge Planning: Goal: Ability to manage health-related needs will improve Outcome: Not Progressing   

## 2020-01-01 NOTE — Progress Notes (Signed)
Dc instructions given and IV dc'd pt verbalizes understanding. Pt taken out via w/c in stable condition.

## 2020-01-01 NOTE — Progress Notes (Addendum)
[facility]  ROUNDING NOTE   Subjective:   [name] was admitted to [facility] on 12/27/2019 for AKI (acute kidney injury) ([place]) [N17.9] Acute renal failure, unspecified acute renal failure type ([place]) [N17.9] Hypertension, unspecified type [I10]  Patient is laying in bed, currently receiving his hemodialysis treatment. He reports he is feeling well since his PD catheter placement surgery yesterday. Denies fever, abdominal pain, nausea, or vomiting. Reports he has a good appetite, ate all of his breakfast, and is ready for lunch. Discussed with patient that he is planned to be discharged after his hemodialysis session today. Patient states he and his wife feel ready for the outpatient PD training that will be done at the [facility] center in [place]. Patient did not have any other questions or concerns.      HEMODIALYSIS FLOWSHEET:  Blood Flow Rate (mL/min): 150 mL/min Arterial Pressure (mmHg): -80 mmHg Venous Pressure (mmHg): 70 mmHg Transmembrane Pressure (mmHg): 20 mmHg Ultrafiltration Rate (mL/min): 340 mL/min Dialysate Flow Rate (mL/min): 600 ml/min Conductivity: Machine : 13.8 Conductivity: Machine : 13.8 Dialysis Fluid Bolus: Normal Saline Bolus Amount (mL): 250 mL     Objective:  Vital signs in last 24 hours:  Temp:  [96.9 F (36.1 C)-98.5 F (36.9 C)] 98.5 F (36.9 C) (11/09 0945) Pulse Rate:  [63-80] 66 (11/09 0800) Resp:  [14-18] 17 (11/09 0800) BP: (100-117)/(62-78) 100/70 (11/09 1015) SpO2:  [94 %-98 %] 96 % (11/09 0800)  Weight change:  Filed Weights   12/28/19 0500 12/29/19 0601 12/30/19 0447  Weight: 62.1 kg 57.7 kg 60.5 kg    Intake/Output: I/O last 3 completed shifts: In: 653 [P.O.:240; I.V.:213; IV Piggyback:200] Out: 1400 [Urine:1360; Blood:40]   Intake/Output this shift:  Total I/O In: 0  Out: 500 [Urine:500]  Physical Exam: General: Laying in bed, receiving hemodialysis, in no acute distress  Head: Moist oral mucosal membranes   Eyes: Sclerae and conjunctivae clear  Lungs:  Respiration even, unlabored, clear to auscultation bilaterally  Heart: S1S2,no rubs or gallops,Regular rate and rhythm   Abdomen:  Soft, nontender, non distended. Clean, dry dressing covering site of catheter  Extremities: No peripheral edema. Knee length compression stockings present on both legs  Neurologic: Oriented x 3  Skin: No acute lesions or rashes    Rt IJ PC / [place]/ 12/28/2019, new PD cathter    Basic Metabolic Panel: Recent Labs  Lab 12/27/19 1108 12/27/19 1108 12/27/19 1756 12/28/19 0524 12/29/19 0913 12/31/19 0506  NA 141  --   --  143 140 142  K 4.7  --   --  4.5 3.6 3.7  CL 109  --   --  111 102 108  CO2 18*  --   --  18* 25 23  GLUCOSE 101*  --   --  88 230* 92  BUN 90*  --   --  86* 63* 59*  CREATININE 6.35*  --   --  6.71* 5.34* 5.23*  CALCIUM 5.3*   < >  --  5.8* 5.9* 6.2*  MG  --   --  2.1  --   --   --   PHOS  --   --   --   --  5.1*  --    < > = values in this interval not displayed.    Liver Function Tests: Recent Labs  Lab 12/27/19 1108 12/28/19 0524 12/29/19 0913  AST 30 21  --   ALT 29 23  --   ALKPHOS 56 55  --  BILITOT 0.7 0.5  --   PROT 6.4* 5.5*  --   ALBUMIN 3.4* 2.9* 2.9*   No results for input(s): LIPASE, AMYLASE in the last 168 hours. No results for input(s): AMMONIA in the last 168 hours.  CBC: Recent Labs  Lab 12/27/19 1108 12/28/19 0524 12/29/19 0913 12/31/19 0506  WBC 8.7 7.1 7.3 7.0  NEUTROABS 6.9  --   --   --   HGB 9.7* 9.8* 10.1* 9.5*  HCT 30.0* 30.8* 31.4* 30.1*  MCV 88.0 88.5 88.0 89.9  PLT 232 235 214 174    Cardiac Enzymes: No results for input(s): CKTOTAL, CKMB, CKMBINDEX, TROPONINI in the last 168 hours.  BNP: Invalid input(s): POCBNP  CBG: Recent Labs  Lab 12/29/19 2108  GLUCAP 104*    Microbiology: Results for orders placed or performed during the hospital encounter of 12/27/19  Respiratory Panel by RT PCR (Flu A&B, Covid) - Nasopharyngeal Swab      Status: None   Collection Time: 12/27/19 12:29 PM   Specimen: Nasopharyngeal Swab  Result Value Ref Range Status   SARS Coronavirus 2 by RT PCR NEGATIVE NEGATIVE Final    Comment: (NOTE) SARS-CoV-2 target nucleic acids are NOT DETECTED.  The SARS-CoV-2 RNA is generally detectable in upper respiratoy specimens during the acute phase of infection. The lowest concentration of SARS-CoV-2 viral copies this assay can detect is 131 copies/mL. A negative result does not preclude SARS-Cov-2 infection and should not be used as the sole basis for treatment or other patient management decisions. A negative result may occur with  improper specimen collection/handling, submission of specimen other than nasopharyngeal swab, presence of viral mutation(s) within the areas targeted by this assay, and inadequate number of viral copies (<131 copies/mL). A negative result must be combined with clinical observations, patient history, and epidemiological information. The expected result is Negative.  Fact Sheet for Patients:  PinkCheek.be  Fact Sheet for Healthcare Providers:  GravelBags.it  This test is no t yet approved or cleared by the [place] FDA and  has been authorized for detection and/or diagnosis of SARS-CoV-2 by FDA under an Emergency Use Authorization (EUA). This EUA will remain  in effect (meaning this test can be used) for the duration of the COVID-19 declaration under Section 564(b)(1) of the Act, 21 U.S.C. section 360bbb-3(b)(1), unless the authorization is terminated or revoked sooner.     Influenza A by PCR NEGATIVE NEGATIVE Final   Influenza B by PCR NEGATIVE NEGATIVE Final    Comment: (NOTE) The Xpert Xpress SARS-CoV-2/FLU/RSV assay is intended as an aid in  the diagnosis of influenza from Nasopharyngeal swab specimens and  should not be used as a sole basis for treatment. Nasal washings and  aspirates are  unacceptable for Xpert Xpress SARS-CoV-2/FLU/RSV  testing.  Fact Sheet for Patients: PinkCheek.be  Fact Sheet for Healthcare Providers: GravelBags.it  This test is not yet approved or cleared by the [place] FDA and  has been authorized for detection and/or diagnosis of SARS-CoV-2 by  FDA under an Emergency Use Authorization (EUA). This EUA will remain  in effect (meaning this test can be used) for the duration of the  Covid-19 declaration under Section 564(b)(1) of the Act, 21  U.S.C. section 360bbb-3(b)(1), unless the authorization is  terminated or revoked. Performed at [facility], [address] [place], [place] [address]     Coagulation Studies: No results for input(s): LABPROT, INR in the last 72 hours.  Urinalysis: No results for input(s): COLORURINE, LABSPEC, [place],  GLUCOSEU, HGBUR, BILIRUBINUR, KETONESUR, PROTEINUR, UROBILINOGEN, NITRITE, LEUKOCYTESUR in the last 72 hours.  Invalid input(s): APPERANCEUR    Imaging: No results found.   Medications:    . acetaminophen  1,000 mg Oral Q6H  . amLODipine  10 mg Oral Daily  . atorvastatin  40 mg Oral Daily  . calcitRIOL  0.5 mcg Oral Daily  . calcium acetate  1,334 mg Oral TID WC  . calcium carbonate  1,250 mg Oral BID BM  . Chlorhexidine Gluconate Cloth  6 each Topical Q0600  . heparin  5,000 Units Subcutaneous Q8H  . hydrALAZINE  50 mg Oral Q8H  . metoprolol succinate  50 mg Oral Daily  . Vitamin D (Ergocalciferol)  50,000 Units Oral Q7 days     Assessment/ Plan:  [name] is a 64 y.o. white male with hypertension, coronary artery disease, nephrolithiasis, history of bladder cancer who is admitted to [facility] on 12/27/2019 for AKI (acute kidney injury) ([place]) [N17.9] Acute renal failure, unspecified acute renal failure type ([place]) [N17.9] Hypertension, unspecified type [I10]  # End stage renal disease  baseline creatinine of  3.26, GFR of 19 on 06/03/2019.  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 Interval marked bilateral hydronephrosis. 2. Markedly echogenic kidneys compatible with marked chronic medical renal disease. 3. Marked diffuse right renal parenchymal atrophy and no significant left renal parenchymal atrophy. 4. Irregular bladder with a moderate-sized postvoid residual.  Urology team is not recommending any interventions as Hydroureteonephrosis is chronic   Lab Results  Component Value Date   CREATININE 5.23 (H) 12/31/2019   CREATININE 5.34 (H) 12/29/2019   CREATININE 6.71 (H) 12/28/2019    - PD catheter was placed on 63/7 without complications.  - Right IJ permcath in place for hemodialysis while PD catheter heals. Plan to discharge after HD session today. - As per case management coordinator, the patient is accepted at [facility] in [place], on TTS schedule. Clinic will start him this Thursday 11/11.  #Chronic Systolic Congestive heart failure: with acute exacerbation -Echocardiogram from December 28, 2019 shows LVEF 20 to 25%, no regional wall motion abnormalities, grade 1 diastolic dysfunction, moderately dilated left ventricular cavity, mild to moderate aortic regurgitation  - Euvolemic at present  #Anemia with kidney disease Lab Results  Component Value Date   HGB 9.5 (L) 12/31/2019    - No acute indication for Epogen  #Hypocalcemia in setting of secondary hyperparathyroidism Lab Results  Component Value Date   PTH 159 (H) 12/27/2019   CALCIUM 6.2 (LL) 12/31/2019   CAION 1.07 (L) 05/15/2019   PHOS 5.1 (H) 12/29/2019  Low calcium is noted. Supplement with vitamin D as well as calcitriol Paient is on Phoslo TID  # Hypertension Blood pressure remained elevated on 11/8, and Hydralazine 50mg  TID was added to regimen as per inpatient hospitalist.  Recent BP 100/70. Currently on amlodipine, metoprolol, and hydralazine.  - Blood  pressure during hemodialysis decreased  Consider d/c hydralazine May have to modify Anti_HTN regimen outpatient    LOS: [place]  11/9/202112:13 PM   Patient was seen and evaluated with [place] PA student, Haynes Kerns.  She assisted with transcription of the note.  I agree with the note as documented.  Murlean Iba, MD [facility]  11/9/20212:37 PM

## 2020-01-01 NOTE — Plan of Care (Signed)

## 2020-01-01 NOTE — Progress Notes (Signed)
Patient accepted at [facility]) TTS 6:00am. Clinic will start him on Thursday 11/11 at 5:45am. Patient is aware of this plan. Please contact me with any other dialysis placement concerns.  Elvera Bicker Dialysis Coordinator [phone]

## 2020-01-02 ENCOUNTER — Telehealth: Payer: Self-pay | Admitting: Surgery

## 2020-01-02 ENCOUNTER — Other Ambulatory Visit: Payer: Self-pay | Admitting: Surgery

## 2020-01-02 LAB — 25-HYDROXY VITAMIN D LCMS D2+D3
25-Hydroxy, Vitamin D-2: <1 ng/mL
25-Hydroxy, Vitamin D-3: 28 ng/mL
25-Hydroxy, Vitamin D: 28 ng/mL — ABNORMAL LOW

## 2020-01-02 MED ORDER — HYDROCODONE-ACETAMINOPHEN 5-325 MG PO TABS
1.0000 | ORAL_TABLET | Freq: Four times a day (QID) | ORAL | 0 refills | Status: DC | PRN
Start: 2020-01-02 — End: 2020-01-16

## 2020-01-02 NOTE — Telephone Encounter (Signed)
Patient calls, he had surgery, hernia repair with Dr. Dahlia Byes on 01/01/20 and states that he needs something for pain.  He was not given anything when he left the hospital yesterday.  Please call him.  Thank you.

## 2020-01-02 NOTE — Telephone Encounter (Signed)
Patient notified to pick up medication at the pharmacy.

## 2020-01-02 NOTE — Telephone Encounter (Signed)
Let patient know request for pain medication has been sent to Dr.Pabon. For now patient can try ibuprofen and tylenol.

## 2020-01-03 ENCOUNTER — Inpatient Hospital Stay: Payer: Medicaid Other | Admitting: Nurse Practitioner

## 2020-01-04 ENCOUNTER — Ambulatory Visit: Payer: Medicaid Other | Admitting: Nurse Practitioner

## 2020-01-04 ENCOUNTER — Encounter: Payer: Self-pay | Admitting: Nurse Practitioner

## 2020-01-04 ENCOUNTER — Telehealth: Payer: Self-pay

## 2020-01-04 ENCOUNTER — Other Ambulatory Visit: Payer: Self-pay

## 2020-01-04 VITALS — BP 94/63 | HR 81 | Temp 98.1°F | Wt 131.4 lb

## 2020-01-04 DIAGNOSIS — N185 Chronic kidney disease, stage 5: Secondary | ICD-10-CM

## 2020-01-04 DIAGNOSIS — N186 End stage renal disease: Secondary | ICD-10-CM | POA: Diagnosis not present

## 2020-01-04 DIAGNOSIS — I10 Essential (primary) hypertension: Secondary | ICD-10-CM

## 2020-01-04 DIAGNOSIS — E785 Hyperlipidemia, unspecified: Secondary | ICD-10-CM | POA: Insufficient documentation

## 2020-01-04 DIAGNOSIS — I4891 Unspecified atrial fibrillation: Secondary | ICD-10-CM | POA: Diagnosis not present

## 2020-01-04 DIAGNOSIS — N179 Acute kidney failure, unspecified: Secondary | ICD-10-CM | POA: Diagnosis not present

## 2020-01-04 DIAGNOSIS — I5023 Acute on chronic systolic (congestive) heart failure: Secondary | ICD-10-CM

## 2020-01-04 DIAGNOSIS — D631 Anemia in chronic kidney disease: Secondary | ICD-10-CM

## 2020-01-04 DIAGNOSIS — E782 Mixed hyperlipidemia: Secondary | ICD-10-CM

## 2020-01-04 DIAGNOSIS — N189 Chronic kidney disease, unspecified: Secondary | ICD-10-CM | POA: Insufficient documentation

## 2020-01-04 DIAGNOSIS — I952 Hypotension due to drugs: Secondary | ICD-10-CM

## 2020-01-04 DIAGNOSIS — I959 Hypotension, unspecified: Secondary | ICD-10-CM | POA: Insufficient documentation

## 2020-01-04 DIAGNOSIS — Z8551 Personal history of malignant neoplasm of bladder: Secondary | ICD-10-CM

## 2020-01-04 DIAGNOSIS — R221 Localized swelling, mass and lump, neck: Secondary | ICD-10-CM

## 2020-01-04 DIAGNOSIS — D692 Other nonthrombocytopenic purpura: Secondary | ICD-10-CM

## 2020-01-04 LAB — CBC WITH DIFFERENTIAL/PLATELET
Hematocrit: 29.6 % — ABNORMAL LOW (ref 37.5–51.0)
Hemoglobin: 9.1 g/dL — ABNORMAL LOW (ref 13.0–17.7)
Lymphocytes Absolute: 1 10*3/uL (ref 0.7–3.1)
Lymphs: 15 %
MCH: 28.8 pg (ref 26.6–33.0)
MCHC: 30.7 g/dL — ABNORMAL LOW (ref 31.5–35.7)
MCV: 94 fL (ref 79–97)
MID (Absolute): 0.8 10*3/uL (ref 0.1–1.6)
MID: 12 %
Neutrophils Absolute: 5 10*3/uL (ref 1.4–7.0)
Neutrophils: 73 %
Platelets: 191 10*3/uL (ref 150–450)
RBC: 3.16 x10E6/uL — ABNORMAL LOW (ref 4.14–5.80)
RDW: 16.2 % — ABNORMAL HIGH (ref 11.6–15.4)
WBC: 6.8 10*3/uL (ref 3.4–10.8)

## 2020-01-04 NOTE — Assessment & Plan Note (Signed)
Continue collaboration with urology.

## 2020-01-04 NOTE — Assessment & Plan Note (Signed)
New diagnosis from hospital.  Currently starting dialysis.  Continue current medication regimen and collaboration with nephrology.

## 2020-01-04 NOTE — Assessment & Plan Note (Signed)
Chronic, ongoing, rate controlled.  Continue Metoprolol for rate control.  CMP today.  Continue to collaborate with cardiology.

## 2020-01-04 NOTE — Telephone Encounter (Signed)
Will complete and bring to you Monday:)

## 2020-01-04 NOTE — Assessment & Plan Note (Signed)
Acute and stable at this time.  Currently on dialysis, started in hospital.  Will continue current medication regimen and collaboration with nephrology.  BMP and TSH today.

## 2020-01-04 NOTE — Assessment & Plan Note (Signed)
Noted on hospital labs.  CBC noting H/H 9.1/29.6, baseline from hospital discharge.  Will continue collaboration with nephrology and monitor.  Check iron and ferritin today.

## 2020-01-04 NOTE — Assessment & Plan Note (Signed)
Chronic, ongoing.  Continue current medication regimen and adjust as needed. Lipid panel today. 

## 2020-01-04 NOTE — Assessment & Plan Note (Signed)
Noted on exam today, he reports this is a "cyst" that had been present for years and is not changing.  Continue to monitor and consider imaging of area and general surgery referral if any changes present.

## 2020-01-04 NOTE — Assessment & Plan Note (Signed)
Acute, improving, euvolemic at this time.  However, hypotension present.  Will stop Hydralazine due to concern for worsening hypotension now that has started dialysis, risk for hypotensive event during dialysis with tight control.  Continue Metoprolol and Amlodipine + Plavix and ASA.  Labs today include CBC, CMP, TSH, lipid.  Recommend: - Reminded to call for an overnight weight gain of >2 pounds or a weekly weight weight of >5 pounds - not adding salt to his food and has been reading food labels. Reviewed the importance of keeping daily sodium intake to 2000mg  daily

## 2020-01-04 NOTE — Patient Instructions (Signed)
STOP HYDRALAZINE DUE TO LOW BLOOD PRESSURE.   Hypotension As your heart beats, it forces blood through your body. This force is called blood pressure. If you have hypotension, you have low blood pressure. When your blood pressure is too low, you may not get enough blood to your brain or other parts of your body. This may cause you to feel weak, light-headed, have a fast heartbeat, or even pass out (faint). Low blood pressure may be harmless, or it may cause serious problems. What are the causes?  Blood loss.  Not enough water in the body (dehydration).  Heart problems.  Hormone problems.  Pregnancy.  A very bad infection.  Not having enough of certain nutrients.  Very bad allergic reactions.  Certain medicines. What increases the risk?  Age. The risk increases as you get older.  Conditions that affect the heart or the brain and spinal cord (central nervous system).  Taking certain medicines.  Being pregnant. What are the signs or symptoms?  Feeling: ? Weak. ? Light-headed. ? Dizzy. ? Tired (fatigued).  Blurred vision.  Fast heartbeat.  Passing out, in very bad cases. How is this treated?  Changing your diet. This may involve eating more salt (sodium) or drinking more water.  Taking medicines to raise your blood pressure.  Changing how much you take (the dosage) of some of your medicines.  Wearing compression stockings. These stockings help to prevent blood clots and reduce swelling in your legs. In some cases, you may need to go to the hospital for:  Fluid replacement. This means you will receive fluids through an IV tube.  Blood replacement. This means you will receive donated blood through an IV tube (transfusion).  Treating an infection or heart problems, if this applies.  Monitoring. You may need to be monitored while medicines that you are taking wear off. Follow these instructions at home: Eating and drinking   Drink enough fluids to keep your  pee (urine) pale yellow.  Eat a healthy diet. Follow instructions from your doctor about what you can eat or drink. A healthy diet includes: ? Fresh fruits and vegetables. ? Whole grains. ? Low-fat (lean) meats. ? Low-fat dairy products.  Eat extra salt only as told. Do not add extra salt to your diet unless your doctor tells you to.  Eat small meals often.  Avoid standing up quickly after you eat. Medicines  Take over-the-counter and prescription medicines only as told by your doctor. ? Follow instructions from your doctor about changing how much you take of your medicines, if this applies. ? Do not stop or change any of your medicines on your own. General instructions   Wear compression stockings as told by your doctor.  Get up slowly from lying down or sitting.  Avoid hot showers and a lot of heat as told by your doctor.  Return to your normal activities as told by your doctor. Ask what activities are safe for you.  Do not use any products that contain nicotine or tobacco, such as cigarettes, e-cigarettes, and chewing tobacco. If you need help quitting, ask your doctor.  Keep all follow-up visits as told by your doctor. This is important. Contact a doctor if:  You throw up (vomit).  You have watery poop (diarrhea).  You have a fever for more than 2-3 days.  You feel more thirsty than normal.  You feel weak and tired. Get help right away if:  You have chest pain.  You have a fast or uneven  heartbeat.  You lose feeling (have numbness) in any part of your body.  You cannot move your arms or your legs.  You have trouble talking.  You get sweaty or feel light-headed.  You pass out.  You have trouble breathing.  You have trouble staying awake.  You feel mixed up (confused). Summary  Hypotension is also called low blood pressure. It is when the force of blood pumping through your arteries is too weak.  Hypotension may be harmless, or it may cause  serious problems.  Treatment may include changing your diet and medicines, and wearing compression stockings.  In very bad cases, you may need to go to the hospital. This information is not intended to replace advice given to you by your health care provider. Make sure you discuss any questions you have with your health care provider. Document Revised: 08/04/2017 Document Reviewed: 08/04/2017 Elsevier Patient Education  [place].

## 2020-01-04 NOTE — Telephone Encounter (Signed)
Copied from [place] [phone]. Topic: General - Other >> Jan 04, 2020  4:18 PM Leward Quan A wrote: Reason for CRM: Patient called to say that he will need the disability paper work left this afternoon back on Monday. Please call with an answer to Ph# [phone]

## 2020-01-04 NOTE — Progress Notes (Signed)
BP 94/63 (BP Location: Left Arm)   Pulse 81   Temp 98.1 F (36.7 C) (Oral)   Wt 131 lb 6.4 oz (59.6 kg)   SpO2 97%   BMI 24.03 kg/m    Subjective:    Patient ID: Anthony Hess, male    DOB: [birth year], 64 y.o.   MRN: [id number]  HPI: Anthony Hess is a 64 y.o. male  Chief Complaint  Patient presents with  . Hospitalization Follow-up   Transition of Care Hospital Follow up.   Admitted to [facility] on 12/27/2019 for hypertension, his BP was elevated at work.  They stopped Amiodarone and Probiotic.  Added Hydralazine and continued Amlodipine and Metoprolol. Started hemodialysis -- is scheduled Tuesday, Thursday, and Saturday -- with plan to start PD. Yesterday was first visit with them.  Denies any light-headed, dizziness, chest pain.  Does endorse at baseline he gets winded pretty easily.  Has history of bypass 12/20/2018.  Is to follow-up with Dr. [name] for recent new diagnosis of ESRD.  He is followed by urology for history of bladder cancer, Dr. Bernardo Heater and Dr. Josefa Half for cardiology.  "Anthony Hess a 64 y.o.malewith medical history significant forcomplicated urologichistory,hypertension with history of medication noncompliance, CKD 4, anemia of chronic disease, heart failure reduced ejection fraction presented to the ED at the urging of his employer for high blood pressure to the 585I systolic.  Acute on chronic kidney disease stage V-- now has end-stage renal disease requiring HD --Nephrology has been consulted-- Dr. Floyce Stakes. [name]  --11/5-- perm cath placement and started patient on dialysis. -He has discussed with patient's wife whose preference is peritoneal dialysis. PD cath to be placed on Monday by surgery. - -Ultrasound of the renals reviewed. --11/6--2nd HD rx -11/7 tolerating HD well --11/8s/p PD cath by Dr Dahlia Byes --11/9-- pt to get HD today and then d/c to home--ok with nephrology  Chronic Bilateral hydronephrosis and history ofbladder cancer status post  transurethral resection -Urology consultation with Dr. Diamantina Providence upper appreciated. Per urology no indication for any urology procedure at this time. Recommend controlling blood pressure hydration and train renal function. No rule for acute intervention with ureteral strengths or nephrectomy tube in the setting of chronic bilateral severe Hydroureteronephrosis -patient has history of bladder cancer. --f/u urology  Acute on chronicheart systolic failure --echo shows EF of 20 to 25% with severe cardiomyopathy -Elevated BNP -Lasix trial 80 mg IV twice daily- x 2 doses -- started now on dialysis. UF 500 mL -sats 97 or 98% on room air.Euvolemic  Hypocalcemia-suspect secondary to renal disease. -Calcium gluconate has been given. -Recommends start calcium acetate oral 3 times daily with meals  uncontrolled hypertension with hypertensive nephropathy noncompliance to medication --amlodipine 10 mg daily,metoprolol 50 mg daily --11/8--added Hydralazine 50 mg tid --11/9--d/c hydralazine due to low/stable bp  Hyperlipidemia on atorvastatin  CAD -No chest pain no EKG changes -Troponin negative delta -Resumed atorvastatin 40 mg, metoprolol succinate,aspirin and plavix  DVT prophylaxis:Heparin Code Status:Full code Diet:Cardiac Family Communication:spoke with wife [name] Disposition Plan:d/c to home after HD today Consults called:Nephrology, surgery and urology  Procedures: permcath placement , PD cath placement Status is: Inpatient  Dispo: The patient is from: Home Anticipated d/c is to: Home Anticipated d/c date DP:OEUMP Patient currently is medically stable to d/c.  Pt has TTS chair time for HD in [place], [place]. Pt and wife aware"  Hospital/Facility: [facility] D/C Physician: Dr. Posey Pronto D/C Date: 01/01/20  Records Requested: 01/04/20 Records Received: 01/04/20 Records Reviewed: 01/04/20  Diagnoses on Discharge:  ESRD now  started on Hemodialysis Malignant HTN Severe Cardiomyopathy with Acute on Chronic CHF systolic  Date of interactive Contact within 48 hours of discharge:  Contact was through: phone  Date of 7 day or 14 day face-to-face visit:    within 14 days  Outpatient Encounter Medications as of 01/04/2020  Medication Sig  . amLODipine (NORVASC) 10 MG tablet Take 1 tablet (10 mg total) by mouth daily.  Marland Kitchen aspirin EC 81 MG tablet Take 81 mg by mouth daily.  Marland Kitchen atorvastatin (LIPITOR) 40 MG tablet Take 1 tablet (40 mg total) by mouth daily.  . calcitRIOL (ROCALTROL) 0.5 MCG capsule Take 1 capsule (0.5 mcg total) by mouth daily.  . calcium acetate (PHOSLO) 667 MG capsule Take 2 capsules (1,334 mg total) by mouth 3 (three) times daily with meals.  . calcium carbonate (OS-CAL - DOSED IN MG OF ELEMENTAL CALCIUM) 1250 (500 Ca) MG tablet Take 1 tablet (1,250 mg total) by mouth 2 (two) times daily between meals.  . clopidogrel (PLAVIX) 75 MG tablet Take 1 tablet (75 mg total) by mouth daily.  Marland Kitchen HYDROcodone-acetaminophen (NORCO) 5-325 MG tablet Take 1-2 tablets by mouth every 6 (six) hours as needed for moderate pain.  . metoprolol succinate (TOPROL-XL) 50 MG 24 hr tablet Take 1 tablet (50 mg total) by mouth daily. Take with or immediately following a meal.  . traMADol (ULTRAM) 50 MG tablet Take 50 mg by mouth 2 (two) times daily as needed (pain.).  Derrill Memo ON 01/05/2020] Vitamin D, Ergocalciferol, (DRISDOL) 1.25 MG (50000 UNIT) CAPS capsule Take 1 capsule (50,000 Units total) by mouth every 7 (seven) days.  . [DISCONTINUED] hydrALAZINE (APRESOLINE) 50 MG tablet Take 50 mg by mouth 3 (three) times daily.   No facility-administered encounter medications on file as of 01/04/2020.    Diagnostic Tests Reviewed/Disposition:   CLINICAL DATA:  Hypertensive urgency  EXAM: CHEST - 2 VIEW  COMPARISON:  01/22/2019  FINDINGS: Increased interstitial prominence. No pleural effusion. No pneumothorax. Stable  cardiomediastinal contours no acute osseous abnormality.  IMPRESSION: Increased interstitial prominence, which may reflect mild edema.   Electronically Signed   By: Macy Mis M.D.   On: 12/27/2019 12:05  CLINICAL DATA:  Renal failure.  EXAM: RENAL / URINARY TRACT ULTRASOUND COMPLETE  COMPARISON:  Abdomen and pelvis CT dated 04/03/2018. Renal ultrasound dated 12/30/2018.  FINDINGS: Right Kidney:  Renal measurements: 9.58.2 x 5.7 cm = volume: 233 mL. Markedly diffusely echogenic with marked diffuse parenchymal thinning. Interval marked right collecting system dilatation.  Left Kidney:  Renal measurements: 10.7 x 5.5 x 5.5 cm = volume: 168 mL. Diffusely echogenic with poorly defined margins. Interval marked dilatation of the collecting system.  Bladder:  Mildly diffusely irregular walls with mild floating internal debris. Calculated prevoid volume of 411 cc and postvoid volume of 161 cc.  Other:  None.  IMPRESSION: 1. Interval marked bilateral hydronephrosis. 2. Markedly echogenic kidneys compatible with marked chronic medical renal disease. 3. Marked diffuse right renal parenchymal atrophy and no significant left renal parenchymal atrophy. 4. Irregular bladder with a moderate-sized postvoid residual.   Electronically Signed   By: Claudie Revering M.D.   On: 12/27/2019 14:11  Consults: urology  Discharge Instructions: Follow-up with urology, nephrology, and PCP  Disease/illness Education: Discussed and educated patient today  Home Health/Community Services Discussions/Referrals:   Establishment or re-establishment of referral orders for community resources: None  Discussion with other health care providers: Reviewed notes  Assessment and Support of treatment regimen adherence: Reviewed with  patient  Appointments Coordinated with: Reviewed with patient  Education for self-management, independent living, and ADLs: Reviewed with  patient  Relevant past medical, surgical, family and social history reviewed and updated as indicated. Interim medical history since our last visit reviewed. Allergies and medications reviewed and updated.  Review of Systems  Constitutional: Negative for activity change, diaphoresis, fatigue and fever.  Respiratory: Positive for shortness of breath (at baseline, no worsening). Negative for cough, chest tightness and wheezing.   Cardiovascular: Negative for chest pain, palpitations and leg swelling.  Gastrointestinal: Negative.   Endocrine: Negative.   Neurological: Negative.   Psychiatric/Behavioral: Negative.     Per HPI unless specifically indicated above     Objective:    BP 94/63 (BP Location: Left Arm)   Pulse 81   Temp 98.1 F (36.7 C) (Oral)   Wt 131 lb 6.4 oz (59.6 kg)   SpO2 97%   BMI 24.03 kg/m   Wt Readings from Last 3 Encounters:  01/04/20 131 lb 6.4 oz (59.6 kg)  12/30/19 133 lb 6.1 oz (60.5 kg)  06/03/19 140 lb (63.5 kg)    Physical Exam Vitals and nursing note reviewed.  Constitutional:      General: He is awake. He is not in acute distress.    Appearance: He is well-developed and well-groomed. He is not ill-appearing.  HENT:     Head: Normocephalic and atraumatic.     Right Ear: Hearing normal. No drainage.     Left Ear: Hearing normal. No drainage.  Eyes:     General: Lids are normal.        Right eye: No discharge.        Left eye: No discharge.     Conjunctiva/sclera: Conjunctivae normal.     Pupils: Pupils are equal, round, and reactive to light.  Neck:     Thyroid: No thyromegaly.     Vascular: No carotid bruit.     Trachea: Trachea normal.   Cardiovascular:     Rate and Rhythm: Normal rate and regular rhythm.     Heart sounds: Normal heart sounds, S1 normal and S2 normal. No murmur heard.  No gallop.   Pulmonary:     Effort: Pulmonary effort is normal. No accessory muscle usage or respiratory distress.     Breath sounds: Normal breath  sounds.  Chest:     Comments: HD catheter intact with dressing present to right upper chest. Abdominal:     General: Bowel sounds are normal.     Palpations: Abdomen is soft.     Comments: PD catheter present with intact dressing over top to left quadrant.  Musculoskeletal:        General: Normal range of motion.     Cervical back: Normal range of motion and neck supple.     Right lower leg: No edema.     Left lower leg: No edema.  Skin:    General: Skin is warm and dry.     Capillary Refill: Capillary refill takes less than 2 seconds.     Findings: No rash.     Comments: Scattered pale purple bruises to bilateral upper extremities.  Neurological:     Mental Status: He is alert and oriented to person, place, and time.     Deep Tendon Reflexes: Reflexes are normal and symmetric.  Psychiatric:        Attention and Perception: Attention normal.        Mood and Affect: Mood normal.  Speech: Speech normal.        Behavior: Behavior normal. Behavior is cooperative.        Thought Content: Thought content normal.     Results for orders placed or performed in visit on 01/04/20  CBC With Differential/Platelet  Result Value Ref Range   WBC 6.8 3.4 - 10.8 x10E3/uL   RBC 3.16 (L) 4.14 - 5.80 x10E6/uL   Hemoglobin 9.1 (L) 13.0 - 17.7 g/dL   Hematocrit 29.6 (L) 37.5 - 51.0 %   MCV 94 79 - 97 fL   MCH 28.8 26.6 - 33.0 pg   MCHC 30.7 (L) 31 - 35 g/dL   RDW 16.2 (H) 11.6 - 15.4 %   Platelets 191 150 - 450 x10E3/uL   Neutrophils 73 Not Estab. %   Lymphs 15 Not Estab. %   MID 12 Not Estab. %   Neutrophils Absolute 5.0 1.40 - 7.00 x10E3/uL   Lymphocytes Absolute 1.0 0 - 3 x10E3/uL   MID (Absolute) 0.8 0.1 - 1.6 X10E3/uL      Assessment & Plan:   Problem List Items Addressed This Visit      Cardiovascular and Mediastinum   Hypertension    Chronic with lower levels at this time, tightly controlled.  Concerned for ongoing hypotension and worsening with dialysis.  Will stop  Hydralazine and continue Amlodipine and Metoprolol at this time, plan to further reduce as needed based on BP readings.  Recommend he monitor BP at home daily and document for provider + focus on DASH diet.  BMP, lipid, and TSH today.  Return in one week for follow-up.      Atrial fibrillation (HCC)    Chronic, ongoing, rate controlled.  Continue Metoprolol for rate control.  CMP today.  Continue to collaborate with cardiology.      Acute on chronic systolic CHF (congestive heart failure) (HCC) - Primary    Acute, improving, euvolemic at this time.  However, hypotension present.  Will stop Hydralazine due to concern for worsening hypotension now that has started dialysis, risk for hypotensive event during dialysis with tight control.  Continue Metoprolol and Amlodipine + Plavix and ASA.  Labs today include CBC, CMP, TSH, lipid.  Recommend: - Reminded to call for an overnight weight gain of >2 pounds or a weekly weight weight of >5 pounds - not adding salt to his food and has been reading food labels. Reviewed the importance of keeping daily sodium intake to 2000mg  daily       Relevant Orders   CBC With Differential/Platelet (Completed)   Comprehensive metabolic panel   Lipid Panel w/o Chol/HDL Ratio   TSH   Hypotension    Noted on exam today on all 3 checks.  He is asymptomatic at this time.  Concerning for worsening low readings now that is on dialysis.  Will stop Hydralazine and continue Amlodipine + Metoprolol at this time.  Plan to further reduce as needed.  Recommend he monitor BP at home and document for provider.  Check BMP today.  CBC in office shows ongoing baseline anemia, suspect from ESRD.  Return to office in one week for BP check.      Senile purpura ([place])    Noted on exam.  Recommend gentle skin care at home and moisturize skin daily.  To monitor for wounds and notify provider if present.        Genitourinary   ESRD (end stage renal disease) ([place])    New diagnosis from  hospital.  Currently  starting dialysis.  Continue current medication regimen and collaboration with nephrology.      Acute renal failure superimposed on stage 5 chronic kidney disease, not on chronic dialysis ([place])    Acute and stable at this time.  Currently on dialysis, started in hospital.  Will continue current medication regimen and collaboration with nephrology.  BMP and TSH today.      Relevant Orders   CBC With Differential/Platelet (Completed)   TSH   Iron, TIBC and Ferritin Panel   Anemia in ESRD (end-stage renal disease) ([place])    Noted on hospital labs.  CBC noting H/H 9.1/29.6, baseline from hospital discharge.  Will continue collaboration with nephrology and monitor.  Check iron and ferritin today.        Other   History of bladder cancer    Continue collaboration with urology.      Hyperlipidemia    Chronic, ongoing.  Continue current medication regimen and adjust as needed.  Lipid panel today.         Mass of left side of neck    Noted on exam today, he reports this is a "cyst" that had been present for years and is not changing.  Continue to monitor and consider imaging of area and general surgery referral if any changes present.         Time: 30 minutes, >50% spent counseling/or care coordination  Follow up plan: Return in about 1 week (around 01/11/2020) for Blood pressure check.

## 2020-01-04 NOTE — Assessment & Plan Note (Addendum)
Chronic with lower levels at this time, tightly controlled.  Concerned for ongoing hypotension and worsening with dialysis.  Will stop Hydralazine and continue Amlodipine and Metoprolol at this time, plan to further reduce as needed based on BP readings.  Recommend he monitor BP at home daily and document for provider + focus on DASH diet.  BMP, lipid, and TSH today.  Return in one week for follow-up.

## 2020-01-04 NOTE — Assessment & Plan Note (Signed)
Noted on exam.  Recommend gentle skin care at home and moisturize skin daily.  To monitor for wounds and notify provider if present.

## 2020-01-04 NOTE — Telephone Encounter (Signed)
Routing to provider, FYI.  

## 2020-01-04 NOTE — Assessment & Plan Note (Signed)
Noted on exam today on all 3 checks.  He is asymptomatic at this time.  Concerning for worsening low readings now that is on dialysis.  Will stop Hydralazine and continue Amlodipine + Metoprolol at this time.  Plan to further reduce as needed.  Recommend he monitor BP at home and document for provider.  Check BMP today.  CBC in office shows ongoing baseline anemia, suspect from ESRD.  Return to office in one week for BP check.

## 2020-01-05 LAB — IRON,TIBC AND FERRITIN PANEL
Ferritin: 137 ng/mL (ref 30–400)
Iron Saturation: 22 % (ref 15–55)
Iron: 41 ug/dL (ref 38–169)
Total Iron Binding Capacity: 184 ug/dL — ABNORMAL LOW (ref 250–450)
UIBC: 143 ug/dL (ref 111–343)

## 2020-01-05 LAB — LIPID PANEL W/O CHOL/HDL RATIO
Cholesterol, Total: 153 mg/dL (ref 100–199)
HDL: 36 mg/dL — ABNORMAL LOW (ref >39)
LDL Chol Calc (NIH): 78 mg/dL (ref 0–99)
Triglycerides: 236 mg/dL — ABNORMAL HIGH (ref 0–149)
VLDL Cholesterol Cal: 39 mg/dL (ref 5–40)

## 2020-01-05 LAB — COMPREHENSIVE METABOLIC PANEL
ALT: 8 IU/L (ref 0–44)
AST: 30 IU/L (ref 0–40)
Albumin/Globulin Ratio: 1.6 (ref 1.2–2.2)
Albumin: 3.5 g/dL — ABNORMAL LOW (ref 3.8–4.8)
Alkaline Phosphatase: 67 IU/L (ref 44–121)
BUN/Creatinine Ratio: 11 (ref 10–24)
BUN: 52 mg/dL — ABNORMAL HIGH (ref 8–27)
Bilirubin Total: <0.2 mg/dL (ref 0.0–1.2)
CO2: 22 mmol/L (ref 20–29)
Calcium: 7.1 mg/dL — ABNORMAL LOW (ref 8.6–10.2)
Chloride: 105 mmol/L (ref 96–106)
Creatinine, Ser: 4.59 mg/dL — ABNORMAL HIGH (ref 0.76–1.27)
GFR calc Af Amer: 15 mL/min/{1.73_m2} — ABNORMAL LOW (ref >59)
GFR calc non Af Amer: 13 mL/min/{1.73_m2} — ABNORMAL LOW (ref >59)
Globulin, Total: 2.2 g/dL (ref 1.5–4.5)
Glucose: 122 mg/dL — ABNORMAL HIGH (ref 65–99)
Potassium: 4.2 mmol/L (ref 3.5–5.2)
Sodium: 143 mmol/L (ref 134–144)
Total Protein: 5.7 g/dL — ABNORMAL LOW (ref 6.0–8.5)

## 2020-01-05 LAB — TSH: TSH: 3.98 u[IU]/mL (ref 0.450–4.500)

## 2020-01-06 NOTE — Progress Notes (Signed)
Good morning, please let Mr. Hoelting know his labs have returned and kidney function continues to show stage 5 disease, but some improvement from previous.  Thyroid lab is normal and cholesterol labs show LDL close to goal.  Continue all current medications, except for Hydralazine as we discussed stopping.  Will follow-up as scheduled.  If any questions, please let me know.  Have a great day!! Keep being awesome!!  Thank you for allowing me to participate in your care. Kindest regards, Ludivina Guymon

## 2020-01-07 ENCOUNTER — Ambulatory Visit: Payer: Self-pay

## 2020-01-07 ENCOUNTER — Encounter: Payer: Self-pay | Admitting: *Deleted

## 2020-01-07 ENCOUNTER — Other Ambulatory Visit: Payer: Self-pay

## 2020-01-07 DIAGNOSIS — Z8551 Personal history of malignant neoplasm of bladder: Secondary | ICD-10-CM | POA: Insufficient documentation

## 2020-01-07 DIAGNOSIS — N186 End stage renal disease: Secondary | ICD-10-CM | POA: Insufficient documentation

## 2020-01-07 DIAGNOSIS — I2510 Atherosclerotic heart disease of native coronary artery without angina pectoris: Secondary | ICD-10-CM | POA: Insufficient documentation

## 2020-01-07 DIAGNOSIS — I5023 Acute on chronic systolic (congestive) heart failure: Secondary | ICD-10-CM | POA: Insufficient documentation

## 2020-01-07 DIAGNOSIS — T8571XA Infection and inflammatory reaction due to peritoneal dialysis catheter, initial encounter: Secondary | ICD-10-CM | POA: Insufficient documentation

## 2020-01-07 DIAGNOSIS — Z87891 Personal history of nicotine dependence: Secondary | ICD-10-CM | POA: Insufficient documentation

## 2020-01-07 DIAGNOSIS — Z951 Presence of aortocoronary bypass graft: Secondary | ICD-10-CM | POA: Insufficient documentation

## 2020-01-07 DIAGNOSIS — Z79899 Other long term (current) drug therapy: Secondary | ICD-10-CM | POA: Insufficient documentation

## 2020-01-07 DIAGNOSIS — Z7982 Long term (current) use of aspirin: Secondary | ICD-10-CM | POA: Insufficient documentation

## 2020-01-07 DIAGNOSIS — Z992 Dependence on renal dialysis: Secondary | ICD-10-CM | POA: Insufficient documentation

## 2020-01-07 DIAGNOSIS — I132 Hypertensive heart and chronic kidney disease with heart failure and with stage 5 chronic kidney disease, or end stage renal disease: Secondary | ICD-10-CM | POA: Insufficient documentation

## 2020-01-07 LAB — CBC WITH DIFFERENTIAL/PLATELET
Abs Immature Granulocytes: 0.04 10*3/uL (ref 0.00–0.07)
Basophils Absolute: 0 10*3/uL (ref 0.0–0.1)
Basophils Relative: 1 %
Eosinophils Absolute: 0.9 10*3/uL — ABNORMAL HIGH (ref 0.0–0.5)
Eosinophils Relative: 10 %
HCT: 28.1 % — ABNORMAL LOW (ref 39.0–52.0)
Hemoglobin: 8.6 g/dL — ABNORMAL LOW (ref 13.0–17.0)
Immature Granulocytes: 1 %
Lymphocytes Relative: 16 %
Lymphs Abs: 1.4 10*3/uL (ref 0.7–4.0)
MCH: 28.8 pg (ref 26.0–34.0)
MCHC: 30.6 g/dL (ref 30.0–36.0)
MCV: 94 fL (ref 80.0–100.0)
Monocytes Absolute: 1.1 10*3/uL — ABNORMAL HIGH (ref 0.1–1.0)
Monocytes Relative: 12 %
Neutro Abs: 5.4 10*3/uL (ref 1.7–7.7)
Neutrophils Relative %: 60 %
Platelets: 242 10*3/uL (ref 150–400)
RBC: 2.99 MIL/uL — ABNORMAL LOW (ref 4.22–5.81)
RDW: 15.7 % — ABNORMAL HIGH (ref 11.5–15.5)
WBC: 8.9 10*3/uL (ref 4.0–10.5)
nRBC: 0 % (ref 0.0–0.2)

## 2020-01-07 LAB — BASIC METABOLIC PANEL
Anion gap: 14 (ref 5–15)
BUN: 68 mg/dL — ABNORMAL HIGH (ref 8–23)
CO2: 21 mmol/L — ABNORMAL LOW (ref 22–32)
Calcium: 6.7 mg/dL — ABNORMAL LOW (ref 8.9–10.3)
Chloride: 109 mmol/L (ref 98–111)
Creatinine, Ser: 5.53 mg/dL — ABNORMAL HIGH (ref 0.61–1.24)
GFR, Estimated: 11 mL/min — ABNORMAL LOW (ref >60)
Glucose, Bld: 112 mg/dL — ABNORMAL HIGH (ref 70–99)
Potassium: 4.5 mmol/L (ref 3.5–5.1)
Sodium: 144 mmol/L (ref 135–145)

## 2020-01-07 NOTE — Telephone Encounter (Signed)
I would recommend visit to ER today due to current symptoms and concerns for his dialysis catheter + new onset flank pain.  May need more urgent imaging and labs to further assess.  Also with BP running low still labs would be beneficial, as well as assessment today.

## 2020-01-07 NOTE — ED Triage Notes (Signed)
Pt to ED reporting pain at peritoneal dialysis catheter. Pt just had surgery to place the catheter last Saturday. No drainage or redness around site and no NVD reported. Pt was prescribed a pain medication that he can not remember at this time but reports it has not been helping.   Catheter was flushed today but pt reports the pain started before that.

## 2020-01-07 NOTE — Telephone Encounter (Signed)
Yes, as concern for current flank pain and tube placement + low BP, which I suspect may be lower after dialysis today.  I would also recommend he alert the dialysis crew while there to his concerns, so nephrology may be able to see him while there.

## 2020-01-07 NOTE — Telephone Encounter (Signed)
Called pt he states that he is a [facility] today doing his kidney flush and he doesn't know if he will make it to the hosp today. Pt is scheduled for tomorrow in office though. Would it be better for him to still go to the er instead of coming here. Please advise.

## 2020-01-07 NOTE — Telephone Encounter (Signed)
Forms completed and signed by provider. Called and notified patient that they were ready to be picked up. No fax number on forms to fax in for the patient.

## 2020-01-07 NOTE — Telephone Encounter (Signed)
Called pt advised of Jolene's message and concerns. He states that he told the dialysis crew and they told him to keep appt tomorrow. Advised that provider wants him to go to the ER pt verbalized understanding

## 2020-01-07 NOTE — Telephone Encounter (Signed)
Called pt back and initially spoke with wife Dorena Dew stated pt having back pain and is worried about his peritoneal dialysis tube coming out. Pt has HD Tues, Thursday and Saturdays. Wife concerned that his BP are still low. BP med dc'd last week.  Spoke with pt and he stated that his flank pain started last night and is 7/10 on pain scale. Pt stated he does not have BP cuff at home.  Pt requesting an appt today with Marnee Guarneri DNP. No appts available. Called office and spoke with [place] who stated to send note to office for Jolene to review.  Advised pt and wife to go to ED if worsens.  Informed pt and wife to expect a phone call back from office.     Reason for Disposition . MODERATE pain (e.g., interferes with normal activities or awakens from sleep)  Answer Assessment - Initial Assessment Questions 1. LOCATION: "Where does it hurt?" (e.g., left, right)     Both sides 2. ONSET: "When did the pain start?"     Last night 3. SEVERITY: "How bad is the pain?" (e.g., Scale 1-10; mild, moderate, or severe)   - MILD (1-3): doesn't interfere with normal activities    - MODERATE (4-7): interferes with normal activities or awakens from sleep    - SEVERE (8-10): excruciating pain and patient unable to do normal activities (stays in bed)       moderate 5. CAUSE: "What do you think is causing the pain?"     Kidney infection 6. OTHER SYMPTOMS:  "Do you have any other symptoms?" (e.g., fever, abdominal pain, vomiting, leg weakness, burning with urination, blood in urine)     no 7. PREGNANCY:  "Is there any chance you are pregnant?" "When was your last menstrual period?"     no  Protocols used: FLANK PAIN-A-AH

## 2020-01-08 ENCOUNTER — Emergency Department
Admission: EM | Admit: 2020-01-08 | Discharge: 2020-01-08 | Disposition: A | Discharge status: Home / Self Care | Payer: Medicaid Other | Source: Home / Self Care | Attending: Emergency Medicine | Admitting: Emergency Medicine

## 2020-01-08 ENCOUNTER — Other Ambulatory Visit: Payer: Self-pay

## 2020-01-08 ENCOUNTER — Ambulatory Visit: Payer: Self-pay | Admitting: Nurse Practitioner

## 2020-01-08 ENCOUNTER — Telehealth: Payer: Self-pay

## 2020-01-08 ENCOUNTER — Encounter: Payer: Self-pay | Admitting: Emergency Medicine

## 2020-01-08 ENCOUNTER — Inpatient Hospital Stay
Admission: EM | Admit: 2020-01-08 | Discharge: 2020-01-09 | DRG: 291 | Disposition: A | Discharge status: Home / Self Care | Payer: Medicaid Other | Attending: Internal Medicine | Admitting: Internal Medicine

## 2020-01-08 DIAGNOSIS — J438 Other emphysema: Secondary | ICD-10-CM | POA: Diagnosis present

## 2020-01-08 DIAGNOSIS — I5022 Chronic systolic (congestive) heart failure: Secondary | ICD-10-CM | POA: Diagnosis present

## 2020-01-08 DIAGNOSIS — I132 Hypertensive heart and chronic kidney disease with heart failure and with stage 5 chronic kidney disease, or end stage renal disease: Principal | ICD-10-CM | POA: Diagnosis present

## 2020-01-08 DIAGNOSIS — Z87891 Personal history of nicotine dependence: Secondary | ICD-10-CM

## 2020-01-08 DIAGNOSIS — I252 Old myocardial infarction: Secondary | ICD-10-CM

## 2020-01-08 DIAGNOSIS — Z7982 Long term (current) use of aspirin: Secondary | ICD-10-CM

## 2020-01-08 DIAGNOSIS — K650 Generalized (acute) peritonitis: Secondary | ICD-10-CM | POA: Diagnosis present

## 2020-01-08 DIAGNOSIS — T8571XA Infection and inflammatory reaction due to peritoneal dialysis catheter, initial encounter: Secondary | ICD-10-CM

## 2020-01-08 DIAGNOSIS — Z8551 Personal history of malignant neoplasm of bladder: Secondary | ICD-10-CM | POA: Diagnosis not present

## 2020-01-08 DIAGNOSIS — Z79899 Other long term (current) drug therapy: Secondary | ICD-10-CM | POA: Diagnosis not present

## 2020-01-08 DIAGNOSIS — Z951 Presence of aortocoronary bypass graft: Secondary | ICD-10-CM | POA: Diagnosis not present

## 2020-01-08 DIAGNOSIS — Z87442 Personal history of urinary calculi: Secondary | ICD-10-CM | POA: Diagnosis not present

## 2020-01-08 DIAGNOSIS — Z7902 Long term (current) use of antithrombotics/antiplatelets: Secondary | ICD-10-CM | POA: Diagnosis not present

## 2020-01-08 DIAGNOSIS — Z992 Dependence on renal dialysis: Secondary | ICD-10-CM

## 2020-01-08 DIAGNOSIS — N1330 Unspecified hydronephrosis: Secondary | ICD-10-CM | POA: Diagnosis present

## 2020-01-08 DIAGNOSIS — J439 Emphysema, unspecified: Secondary | ICD-10-CM | POA: Diagnosis present

## 2020-01-08 DIAGNOSIS — E785 Hyperlipidemia, unspecified: Secondary | ICD-10-CM | POA: Diagnosis present

## 2020-01-08 DIAGNOSIS — D631 Anemia in chronic kidney disease: Secondary | ICD-10-CM | POA: Diagnosis present

## 2020-01-08 DIAGNOSIS — I2510 Atherosclerotic heart disease of native coronary artery without angina pectoris: Secondary | ICD-10-CM | POA: Diagnosis present

## 2020-01-08 DIAGNOSIS — Z20822 Contact with and (suspected) exposure to covid-19: Secondary | ICD-10-CM | POA: Diagnosis present

## 2020-01-08 DIAGNOSIS — I10 Essential (primary) hypertension: Secondary | ICD-10-CM | POA: Diagnosis not present

## 2020-01-08 DIAGNOSIS — N186 End stage renal disease: Secondary | ICD-10-CM | POA: Diagnosis present

## 2020-01-08 DIAGNOSIS — N189 Chronic kidney disease, unspecified: Secondary | ICD-10-CM | POA: Diagnosis present

## 2020-01-08 LAB — BODY FLUID CELL COUNT WITH DIFFERENTIAL
Eos, Fluid: 59 %
Lymphs, Fluid: 8 %
Monocyte-Macrophage-Serous Fluid: 30 % — ABNORMAL LOW (ref 50–90)
Neutrophil Count, Fluid: 3 % (ref 0–25)
Total Nucleated Cell Count, Fluid: 2000 cu mm — ABNORMAL HIGH (ref 0–1000)

## 2020-01-08 LAB — CBC
HCT: 28.9 % — ABNORMAL LOW (ref 39.0–52.0)
Hemoglobin: 9 g/dL — ABNORMAL LOW (ref 13.0–17.0)
MCH: 28.8 pg (ref 26.0–34.0)
MCHC: 31.1 g/dL (ref 30.0–36.0)
MCV: 92.6 fL (ref 80.0–100.0)
Platelets: 233 10*3/uL (ref 150–400)
RBC: 3.12 MIL/uL — ABNORMAL LOW (ref 4.22–5.81)
RDW: 15.7 % — ABNORMAL HIGH (ref 11.5–15.5)
WBC: 8.1 10*3/uL (ref 4.0–10.5)
nRBC: 0 % (ref 0.0–0.2)

## 2020-01-08 LAB — RENAL FUNCTION PANEL
Albumin: 3 g/dL — ABNORMAL LOW (ref 3.5–5.0)
Anion gap: 13 (ref 5–15)
BUN: 69 mg/dL — ABNORMAL HIGH (ref 8–23)
CO2: 22 mmol/L (ref 22–32)
Calcium: 6.5 mg/dL — ABNORMAL LOW (ref 8.9–10.3)
Chloride: 109 mmol/L (ref 98–111)
Creatinine, Ser: 5.62 mg/dL — ABNORMAL HIGH (ref 0.61–1.24)
GFR, Estimated: 11 mL/min — ABNORMAL LOW (ref >60)
Glucose, Bld: 87 mg/dL (ref 70–99)
Phosphorus: 7 mg/dL — ABNORMAL HIGH (ref 2.5–4.6)
Potassium: 4.5 mmol/L (ref 3.5–5.1)
Sodium: 144 mmol/L (ref 135–145)

## 2020-01-08 LAB — RESPIRATORY PANEL BY RT PCR (FLU A&B, COVID)
Influenza A by PCR: NEGATIVE
Influenza B by PCR: NEGATIVE
SARS Coronavirus 2 by RT PCR: NEGATIVE

## 2020-01-08 MED ORDER — ASPIRIN EC 81 MG PO TBEC
81.0000 mg | DELAYED_RELEASE_TABLET | Freq: Every day | ORAL | Status: DC
  Administered 2020-01-09: 81 mg via ORAL
  Filled 2020-01-08: qty 1

## 2020-01-08 MED ORDER — PENTAFLUOROPROP-TETRAFLUOROETH EX AERO
1.0000 "application " | INHALATION_SPRAY | CUTANEOUS | Status: DC | PRN
  Filled 2020-01-08: qty 30

## 2020-01-08 MED ORDER — CALCIUM ACETATE (PHOS BINDER) 667 MG PO CAPS
1334.0000 mg | ORAL_CAPSULE | Freq: Three times a day (TID) | ORAL | Status: DC
  Administered 2020-01-08 – 2020-01-09 (×3): 1334 mg via ORAL
  Filled 2020-01-08 (×4): qty 2

## 2020-01-08 MED ORDER — ONDANSETRON HCL 4 MG PO TABS: 4.0000 mg | ORAL_TABLET | Freq: Four times a day (QID) | ORAL | Status: DC | PRN

## 2020-01-08 MED ORDER — ALTEPLASE 2 MG IJ SOLR: 2.0000 mg | Freq: Once | INTRAMUSCULAR | Status: DC | PRN

## 2020-01-08 MED ORDER — HEPARIN SODIUM (PORCINE) 1000 UNIT/ML DIALYSIS: 1000.0000 [IU] | INTRAMUSCULAR | Status: DC | PRN

## 2020-01-08 MED ORDER — EPOETIN ALFA 4000 UNIT/ML IJ SOLN
4000.0000 [IU] | INTRAMUSCULAR | Status: DC
  Administered 2020-01-08: 4000 [IU] via INTRAVENOUS
  Filled 2020-01-08: qty 1

## 2020-01-08 MED ORDER — HYDROCODONE-ACETAMINOPHEN 5-325 MG PO TABS: 1.0000 | ORAL_TABLET | Freq: Four times a day (QID) | ORAL | Status: DC | PRN

## 2020-01-08 MED ORDER — VITAMIN D (ERGOCALCIFEROL) 1.25 MG (50000 UNIT) PO CAPS
50000.0000 [IU] | ORAL_CAPSULE | ORAL | Status: DC
  Administered 2020-01-08: 50000 [IU] via ORAL
  Filled 2020-01-08: qty 1

## 2020-01-08 MED ORDER — CHLORHEXIDINE GLUCONATE CLOTH 2 % EX PADS
6.0000 | MEDICATED_PAD | Freq: Every day | CUTANEOUS | Status: DC
  Administered 2020-01-09: 6 via TOPICAL

## 2020-01-08 MED ORDER — HEPARIN SODIUM (PORCINE) 5000 UNIT/ML IJ SOLN
5000.0000 [IU] | Freq: Three times a day (TID) | INTRAMUSCULAR | Status: DC
  Administered 2020-01-08 – 2020-01-09 (×3): 5000 [IU] via SUBCUTANEOUS
  Filled 2020-01-08 (×3): qty 1

## 2020-01-08 MED ORDER — CLOPIDOGREL BISULFATE 75 MG PO TABS
75.0000 mg | ORAL_TABLET | Freq: Every day | ORAL | Status: DC
  Administered 2020-01-08 – 2020-01-09 (×2): 75 mg via ORAL
  Filled 2020-01-08 (×2): qty 1

## 2020-01-08 MED ORDER — SODIUM CHLORIDE 0.9 % IV SOLN: 100.0000 mL | INTRAVENOUS | Status: DC | PRN

## 2020-01-08 MED ORDER — CALCITRIOL 0.25 MCG PO CAPS
0.5000 ug | ORAL_CAPSULE | Freq: Every day | ORAL | Status: DC
  Administered 2020-01-08 – 2020-01-09 (×2): 0.5 ug via ORAL
  Filled 2020-01-08 (×2): qty 2

## 2020-01-08 MED ORDER — ONDANSETRON HCL 4 MG/2ML IJ SOLN: 4.0000 mg | Freq: Four times a day (QID) | INTRAMUSCULAR | Status: DC | PRN

## 2020-01-08 MED ORDER — SODIUM CHLORIDE 0.9 % IV SOLN
1.0000 g | Freq: Once | INTRAVENOUS | Status: AC
  Administered 2020-01-08: 1 g via INTRAVENOUS
  Filled 2020-01-08: qty 1

## 2020-01-08 MED ORDER — LIDOCAINE HCL (PF) 1 % IJ SOLN
5.0000 mL | INTRAMUSCULAR | Status: DC | PRN
  Filled 2020-01-08: qty 5

## 2020-01-08 MED ORDER — SODIUM CHLORIDE 0.9% FLUSH
3.0000 mL | Freq: Two times a day (BID) | INTRAVENOUS | Status: DC
  Administered 2020-01-08 – 2020-01-09 (×3): 3 mL via INTRAVENOUS

## 2020-01-08 MED ORDER — METOPROLOL SUCCINATE ER 50 MG PO TB24
50.0000 mg | ORAL_TABLET | Freq: Every day | ORAL | Status: DC
  Administered 2020-01-08 – 2020-01-09 (×2): 50 mg via ORAL
  Filled 2020-01-08 (×2): qty 1

## 2020-01-08 MED ORDER — VANCOMYCIN HCL 1250 MG/250ML IV SOLN
1250.0000 mg | Freq: Once | INTRAVENOUS | Status: AC
  Administered 2020-01-08: 1250 mg via INTRAVENOUS
  Filled 2020-01-08 (×2): qty 250

## 2020-01-08 MED ORDER — CEFTAZIDIME 200 MG/ML FOR DIALYSIS
1000.0000 mg | INJECTION | Freq: Once | INTRAVENOUS_CENTRAL | Status: DC
  Filled 2020-01-08: qty 5

## 2020-01-08 MED ORDER — SODIUM CHLORIDE 0.9 % IV SOLN: 250.0000 mL | INTRAVENOUS | Status: DC | PRN

## 2020-01-08 MED ORDER — SODIUM CHLORIDE 0.9% FLUSH: 3.0000 mL | INTRAVENOUS | Status: DC | PRN

## 2020-01-08 MED ORDER — ATORVASTATIN CALCIUM 20 MG PO TABS
40.0000 mg | ORAL_TABLET | Freq: Every day | ORAL | Status: DC
  Administered 2020-01-09: 40 mg via ORAL
  Filled 2020-01-08: qty 2

## 2020-01-08 MED ORDER — AMLODIPINE BESYLATE 10 MG PO TABS
10.0000 mg | ORAL_TABLET | Freq: Every day | ORAL | Status: DC
  Administered 2020-01-08 – 2020-01-09 (×2): 10 mg via ORAL
  Filled 2020-01-08: qty 2
  Filled 2020-01-08: qty 1

## 2020-01-08 MED ORDER — CALCIUM CARBONATE 1250 (500 CA) MG PO TABS
1250.0000 mg | ORAL_TABLET | Freq: Two times a day (BID) | ORAL | Status: DC
  Administered 2020-01-08 – 2020-01-09 (×2): 1250 mg via ORAL
  Filled 2020-01-08 (×4): qty 1

## 2020-01-08 MED ORDER — LIDOCAINE-PRILOCAINE 2.5-2.5 % EX CREA
1.0000 "application " | TOPICAL_CREAM | CUTANEOUS | Status: DC | PRN
  Filled 2020-01-08: qty 5

## 2020-01-08 NOTE — Progress Notes (Signed)
Patient's wife, Anthony Hess called wanting an update from MD and to understand plan of care as well as pending discharge date. She states she would like MD to call her back today with an update on patient's status.  MD notified.

## 2020-01-08 NOTE — Progress Notes (Signed)
Patient to room from ED, alert and oriented.  Patient oriented to room and call bell and alerted he would be going to dialysis. Will continue to montior.  Call bell within reach and bed locked in lowest position.

## 2020-01-08 NOTE — ED Provider Notes (Signed)
[facility] Emergency Department Provider Note ____________________________________________   First MD Initiated Contact with Patient 01/08/20 0118     (approximate)  I have reviewed the triage vital signs and the nursing notes.  HISTORY  Chief Complaint Post Op Problem   HPI Anthony Hess is a 64 y.o. malewho presents to the ED for evaluation of pain at PD catheter site  Chart review indicates Dr. Adora Fridge with general surgery placed PD catheter on 11/8, 8 days ago. History of bladder cancer s/p transurethral resection.  Systolic heart failure with EF of 20%.  CAD on DAPT.  HTN, HLD.  Patient presents to the ED for evaluation of pain around his PD catheter access site.  He reports the pain has been there since the catheter was placed a week ago in a constant fashion.  He denies diffuse abdominal pain reports the pain is more focally oriented around the insertion site.  He reports 4/10 intensity aching pain that is constant and nonradiating.  He reports getting his PD catheter flushed earlier today (11/15), and casually telling the nurse about this who advised ED evaluation.  Patient denies any fevers, emesis, stool changes or diarrhea, shortness of breath, cough or chest pain.  He reports that he feels fine, but has had constant pain to his PD access site.   Past Medical History:  Diagnosis Date  . Bladder tumor   . Cancer [facility])    Bladder  . Chronic kidney disease    renal insufficiency  . Coronary artery disease   . History of kidney stones   . Hypertension   . Myocardial infarction ([place])   . Wears glasses     Patient Active Problem List   Diagnosis Date Noted  . Hypotension 01/04/2020  . Anemia in ESRD (end-stage renal disease) ([place]) 01/04/2020  . Hyperlipidemia 01/04/2020  . Mass of left side of neck 01/04/2020  . Senile purpura ([place]) 01/04/2020  . Acute on chronic systolic CHF (congestive heart failure) ([place])   . Hydroureteronephrosis   .  Acute renal failure superimposed on stage 5 chronic kidney disease, not on chronic dialysis ([place]) 12/27/2019  . Atrial fibrillation ([place]) 01/05/2019  . ESRD (end stage renal disease) ([place]) 01/05/2019  . 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 Emphysema lung ([place]) 04/14/2018  . Bilateral hydronephrosis 04/03/2018  . Hypertension 03/27/2018  . Cigarette smoker 03/20/2018  . History of bladder cancer 03/20/2018    Past Surgical History:  Procedure Laterality Date  . CAPD INSERTION N/A 12/31/2019   Procedure: LAPAROSCOPIC INSERTION CONTINUOUS AMBULATORY PERITONEAL DIALYSIS  (CAPD) CATHETER;  Surgeon: Jules Husbands, MD;  Location: [facility];  Service: General;  Laterality: N/A;  . CORONARY ARTERY BYPASS GRAFT N/A 12/27/2018   Procedure: CORONARY ARTERY BYPASS GRAFTING (CABG) X 4 ON PUMP USING RIGHT & LEFT INTERNAL MAMMARY ARTERY LEFT RADIAL ARTERY ENDOSCOPICALLY HARVESTED;  Surgeon: [name], MD;  Location: [place];  Service: Open Heart Surgery;  Laterality: N/A;  . CYSTOSCOPY W/ RETROGRADES Bilateral 05/15/2019   Procedure: CYSTOSCOPY WITH RETROGRADE PYELOGRAM;  Surgeon: Abbie Sons, MD;  Location: [facility];  Service: Urology;  Laterality: Bilateral;  . CYSTOSCOPY WITH BIOPSY N/A 05/15/2019   Procedure: CYSTOSCOPY WITH bladder BIOPSY;  Surgeon: Abbie Sons, MD;  Location: [facility];  Service: Urology;  Laterality: N/A;  . DIALYSIS/PERMA CATHETER INSERTION N/A 12/28/2019   Procedure: DIALYSIS/PERMA CATHETER INSERTION;  Surgeon: Algernon Huxley, MD;  Location: [place] [facility];  Service: Cardiovascular;  Laterality: N/A;  .  LEFT HEART CATH AND CORONARY ANGIOGRAPHY Left 12/20/2018   Procedure: LEFT HEART CATH AND CORONARY ANGIOGRAPHY;  Surgeon: Isaias Cowman, MD;  Location: [place] [facility];  Service: Cardiovascular;  Laterality: Left;  . RADIAL ARTERY HARVEST Left 12/27/2018   Procedure: ENDOSCOPIC RADIAL ARTERY HARVEST;  Surgeon: [name], MD;  Location: [place];  Service: Open  Heart Surgery;  Laterality: Left;  . TEE WITHOUT CARDIOVERSION N/A 12/27/2018   Procedure: TRANSESOPHAGEAL ECHOCARDIOGRAM (TEE);  Surgeon: [name], MD;  Location: [place];  Service: Open Heart Surgery;  Laterality: N/A;  . TUMOR REMOVAL  2019   Bladder    Prior to Admission medications   Medication Sig Start Date End Date Taking? Authorizing Provider  amLODipine (NORVASC) 10 MG tablet Take 1 tablet (10 mg total) by mouth daily. 01/01/20   Fritzi Mandes, MD  aspirin EC 81 MG tablet Take 81 mg by mouth daily.    [name], MD  atorvastatin (LIPITOR) 40 MG tablet Take 1 tablet (40 mg total) by mouth daily. 01/01/20   Fritzi Mandes, MD  calcitRIOL (ROCALTROL) 0.5 MCG capsule Take 1 capsule (0.5 mcg total) by mouth daily. 01/01/20   Fritzi Mandes, MD  calcium acetate (PHOSLO) 667 MG capsule Take 2 capsules (1,334 mg total) by mouth 3 (three) times daily with meals. 01/01/20   Fritzi Mandes, MD  calcium carbonate (OS-CAL - DOSED IN MG OF ELEMENTAL CALCIUM) 1250 (500 Ca) MG tablet Take 1 tablet (1,250 mg total) by mouth 2 (two) times daily between meals. 01/01/20   Fritzi Mandes, MD  clopidogrel (PLAVIX) 75 MG tablet Take 1 tablet (75 mg total) by mouth daily. 01/01/20   Fritzi Mandes, MD  HYDROcodone-acetaminophen (NORCO) 5-325 MG tablet Take 1-2 tablets by mouth every 6 (six) hours as needed for moderate pain. 01/02/20   Pabon, Diego F, MD  metoprolol succinate (TOPROL-XL) 50 MG 24 hr tablet Take 1 tablet (50 mg total) by mouth daily. Take with or immediately following a meal. 01/01/20   Fritzi Mandes, MD  traMADol (ULTRAM) 50 MG tablet Take 50 mg by mouth 2 (two) times daily as needed (pain.).    [provider]  Vitamin D, Ergocalciferol, (DRISDOL) 1.25 MG (50000 UNIT) CAPS capsule Take 1 capsule (50,000 Units total) by mouth every 7 (seven) days. 01/05/20   Fritzi Mandes, MD    Allergies Patient has no known allergies.  Family History  Family history unknown: Yes    Social History Social  History   Tobacco Use  . Smoking status: Former Smoker    Packs/day: 0.25    Types: Cigarettes    Quit date: 02/19/2019    Years since quitting: 0.8  . Smokeless tobacco: Never Used  Vaping Use  . Vaping Use: Never used  Substance Use Topics  . Alcohol use: Never  . Drug use: Never    Review of Systems  Constitutional: No fever/chills Eyes: No visual changes. ENT: No sore throat. Cardiovascular: Denies chest pain. Respiratory: Denies shortness of breath. Gastrointestinal: .  No nausea, no vomiting.  No diarrhea.  No constipation.  Positive for abdominal pain. Genitourinary: Negative for dysuria. Musculoskeletal: Negative for back pain. Skin: Negative for rash. Neurological: Negative for headaches, focal weakness or numbness.  ____________________________________________   PHYSICAL EXAM:  VITAL SIGNS: Vitals:   01/08/20 0213 01/08/20 0505  BP: (!) 185/110 (!) 189/109  Pulse: 85 71  Resp: 17 16  Temp: 98 F (36.7 C)   SpO2: 97% 95%  Constitutional: Alert and oriented. Well appearing and in no acute distress. Eyes: Conjunctivae are normal. PERRL. EOMI. Head: Atraumatic. Nose: No congestion/rhinnorhea. Mouth/Throat: Mucous membranes are moist.  Oropharynx non-erythematous. Neck: No stridor. No cervical spine tenderness to palpation. Cardiovascular: Normal rate, regular rhythm. Grossly normal heart sounds.  Good peripheral circulation. Respiratory: Normal respiratory effort.  No retractions. Lungs CTAB. Gastrointestinal: Soft , nondistended, nontender to palpation. No CVA tenderness. PD catheter in place, and I removed clean bandages demonstrating clean insertion site to the skin.  No erythema, induration, fluctuance or purulence. Benign abdomen throughout. He does report "some bits of pain around there" referring to my palpations directly adjacent to his PD catheter insertion site, but he minimizes this and I don't appreciate it causing him any discomfort.   Musculoskeletal: No lower extremity tenderness nor edema.  No joint effusions. No signs of acute trauma. Neurologic:  Normal speech and language. No gross focal neurologic deficits are appreciated. No gait instability noted. Skin:  Skin is warm, dry and intact. No rash noted. Psychiatric: Mood and affect are normal. Speech and behavior are normal.  ____________________________________________   LABS (all labs ordered are listed, but only abnormal results are displayed)  Labs Reviewed  CBC WITH DIFFERENTIAL/PLATELET - Abnormal; Notable for the following components:      Result Value   RBC 2.99 (*)    Hemoglobin 8.6 (*)    HCT 28.1 (*)    RDW 15.7 (*)    Monocytes Absolute 1.1 (*)    Eosinophils Absolute 0.9 (*)    All other components within normal limits  BASIC METABOLIC PANEL - Abnormal; Notable for the following components:   CO2 21 (*)    Glucose, Bld 112 (*)    BUN 68 (*)    Creatinine, Ser 5.53 (*)    Calcium 6.7 (*)    GFR, Estimated 11 (*)    All other components within normal limits  BODY FLUID CELL COUNT WITH DIFFERENTIAL - Abnormal; Notable for the following components:   Appearance, Fluid CLOUDY (*)    Total Nucleated Cell Count, Fluid 2,000 (*)    Monocyte-Macrophage-Serous Fluid 30 (*)    All other components within normal limits  BODY FLUID CULTURE  PATHOLOGIST SMEAR REVIEW    ____________________________________________   PROCEDURES and INTERVENTIONS  Procedure(s) performed (including Critical Care):  .1-3 Lead EKG Interpretation Performed by: Vladimir Crofts, MD Authorized by: Vladimir Crofts, MD     Interpretation: normal     ECG rate:  85   ECG rate assessment: normal     Rhythm: sinus rhythm     Ectopy: none     Conduction: normal      Medications - No data to display  ____________________________________________   MDM / ED COURSE  64 year old man who just had a PD catheter placed 8 days ago, presents with complaints of mild pain that has  been constant and present since insertion, concerning for peritonitis and ultimately leaving AMA. Hemodynamically stable without fever or tachycardia. Exam is quite benign and reassuring with a well-appearing patient without distress or peritoneal signs on abdominal exam. Blood work demonstrates stigmata of ESRD, without leukocytosis or left shift. I sterilely acquire about 10 cc of his peritoneal fluid and sent to the lab. While we are waiting for this test, patient expresses desire to be discharged. We initially discussed via shared decision making plan to discharge to his normally scheduled dialysis, and he will get a phone call if any abnormal lab results. As he is leaving,  we are finally getting some information from cell count which shows a remarkably elevated total cell count concerning for peritonitis. Before he leaves, I educate him of this and my recommendations for medical admission, but he continues to decline indicating that he wishes to leave. He has capacity to make this decision and acknowledges the risk of decompensation. He reports that he will return to the ED after he let his dog out and gets dialyzed as an outpatient. We thoroughly discussed return precautions for the ED multiple times, and patient ambulates out of the ED independently in no acute distress AMA.    Clinical Course as of Jan 08 539  Tue Jan 08, 2020  3536 Utilizing sterile technique, about 10 cc of peritoneal fluid was aspirated by me at the bedside.  Sample is not particularly cloudy, it is clear, and has some floaters   [DS]  0325 Call the lab to add on cell count with differential which is not originally ordered.  The indicate that the fluid specimen had to be sent to [facility] because of the culture order which cannot be performed at this facility.  They will call over to [facility] and have the cell count added on   [DS]  0428 Call from lab tech, who has fixed my lab orders because one of them was for a CSF  cell count and not body fluid.  She updates me that the fluid probably has not yet arrived to [facility]   [DS]  0507 Reassessed.  Patient reports that he feels fine and has no complaints.  Reexamination of his abdomen reveals continued benign exam without peritoneal features.  We discussed the possibility of peritonitis as the source of his pain.  I educate him that this is relatively unlikely considering the catheters only been in place for 1 week, he is a nontender abdomen, no leukocytosis or fever, but certainly still possible.  Shared decision making of discharging so patient can make his normal dialysis appointment and we call him if there is any significant pathology on his fluid testing versus patient stays and we wait for these lab tests and he tries to reschedule his dialysis appointment.  He indicates a desire to go home and feels comfortable doing so.  I think this is reasonable.  We again discussed return precautions for the ED.    [DS]  0522 Gram stain just resulting. No organisms noted. Just WBCs. Still no cell count. Patient getting his clothes on and requesting to leave. Not willing to stay for remainder of results   [DS]  0531 Cell count returning very elevated.  Patient was walking out the door, and I catch him before he leaves.  Return to the room that he was in and I explained to him my concern for infection within his abdomen based off of these fluid tests.  I strongly urged him to stay and recommended medical admission for treatment of peritonitis.  He expresses hesitancy because he would like to go to his normally scheduled dialysis appointment, as well as having a dog at home and reports that it needs to be let out.  I again urged him to stay, we can try to have social work help arrange for someone to let his dog out.  He declines and indicates that he wishes to go home, then go to dialysis and then he will immediately return thereafter for treatment of peritonitis.  He acknowledges  the risk of worsening disease, infection, sepsis and/or death, and he has  capacity to make this decision.  He promises that he will return after dialysis and letting his dogs out.  He ambulates out of the ED in no acute distress after signing AMA paperwork   [DS]    Clinical Course User Index [DS] Vladimir Crofts, MD    ____________________________________________   FINAL CLINICAL IMPRESSION(S) / ED DIAGNOSES  Final diagnoses:  Peritoneal dialysis catheter in place [facility])  Peritoneal dialysis-associated peritonitis, initial encounter [facility])     ED Discharge Orders    None       Darnise Montag   Note:  This document was prepared using Dragon voice recognition software and may include unintentional dictation errors.   Vladimir Crofts, MD 01/08/20 [phone]

## 2020-01-08 NOTE — Telephone Encounter (Signed)
Please let his wife know I am glad he went to ER, I reviewed current admission note.  I looked at medication list and do not see any concerns for interaction.  They may adjust this further though while in hospital.  I would recommend she reach out to staff at hospital during this admission and see if changes made.

## 2020-01-08 NOTE — ED Provider Notes (Signed)
[facility] Emergency Department Provider Note ____________________________________________   First MD Initiated Contact with Patient 01/08/20 [phone]     (approximate)  I have reviewed the triage vital signs and the nursing notes.  HISTORY  Chief Complaint Vascular Access Problem and Post-op Problem   HPI Anthony Hess is a 64 y.o. malewho presents to the ED for evaluation of peritonitis  Chart review indicates I just saw this patient less than 1 hour ago for abdominal pain in the setting of recent PD catheter placement.  His work-up was concerning for peritonitis with cloudy dialysis fluid with high cell count.  I recommended medical admission, but he indicated that he had to go home to let his dog out.  He reports going home, letting his dog out and returns to the ED for admission.  While he was gone, he denies any acute events.  Denies fevers, worsening of his abdominal pain, syncope, emesis.  Reports similar and continued 5/10 intensity generalized aching abdominal discomfort.   Past Medical History:  Diagnosis Date  . Bladder tumor   . Cancer [facility])    Bladder  . Chronic kidney disease    renal insufficiency  . Coronary artery disease   . History of kidney stones   . Hypertension   . Myocardial infarction ([place])   . Wears glasses     Patient Active Problem List   Diagnosis Date Noted  . Hypotension 01/04/2020  . Anemia in ESRD (end-stage renal disease) ([place]) 01/04/2020  . Hyperlipidemia 01/04/2020  . Mass of left side of neck 01/04/2020  . Senile purpura ([place]) 01/04/2020  . Acute on chronic systolic CHF (congestive heart failure) ([place])   . Hydroureteronephrosis   . Acute renal failure superimposed on stage 5 chronic kidney disease, not on chronic dialysis ([place]) 12/27/2019  . Atrial fibrillation ([place]) 01/05/2019  . ESRD (end stage renal disease) ([place]) 01/05/2019  . 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 Emphysema lung ([place]) 04/14/2018  . Bilateral  hydronephrosis 04/03/2018  . Hypertension 03/27/2018  . Cigarette smoker 03/20/2018  . History of bladder cancer 03/20/2018    Past Surgical History:  Procedure Laterality Date  . CAPD INSERTION N/A 12/31/2019   Procedure: LAPAROSCOPIC INSERTION CONTINUOUS AMBULATORY PERITONEAL DIALYSIS  (CAPD) CATHETER;  Surgeon: Jules Husbands, MD;  Location: [facility] ORS;  Service: General;  Laterality: N/A;  . CORONARY ARTERY BYPASS GRAFT N/A 12/27/2018   Procedure: CORONARY ARTERY BYPASS GRAFTING (CABG) X 4 ON PUMP USING RIGHT & LEFT INTERNAL MAMMARY ARTERY LEFT RADIAL ARTERY ENDOSCOPICALLY HARVESTED;  Surgeon: [name], MD;  Location: [place];  Service: Open Heart Surgery;  Laterality: N/A;  . CYSTOSCOPY W/ RETROGRADES Bilateral 05/15/2019   Procedure: CYSTOSCOPY WITH RETROGRADE PYELOGRAM;  Surgeon: Abbie Sons, MD;  Location: [facility] ORS;  Service: Urology;  Laterality: Bilateral;  . CYSTOSCOPY WITH BIOPSY N/A 05/15/2019   Procedure: CYSTOSCOPY WITH bladder BIOPSY;  Surgeon: Abbie Sons, MD;  Location: [facility] ORS;  Service: Urology;  Laterality: N/A;  . DIALYSIS/PERMA CATHETER INSERTION N/A 12/28/2019   Procedure: DIALYSIS/PERMA CATHETER INSERTION;  Surgeon: Algernon Huxley, MD;  Location: [place] CV LAB;  Service: Cardiovascular;  Laterality: N/A;  . LEFT HEART CATH AND CORONARY ANGIOGRAPHY Left 12/20/2018   Procedure: LEFT HEART CATH AND CORONARY ANGIOGRAPHY;  Surgeon: Isaias Cowman, MD;  Location: [place] CV LAB;  Service: Cardiovascular;  Laterality: Left;  . RADIAL ARTERY HARVEST Left 12/27/2018   Procedure: ENDOSCOPIC RADIAL ARTERY HARVEST;  Surgeon: [name],  MD;  Location[facility];  Service: Open Heart Surgery;  Laterality: Left;  . TEE WITHOUT CARDIOVERSION N/A 12/27/2018   Procedure: TRANSESOPHAGEAL ECHOCARDIOGRAM (TEE);  Surgeon: [name], MD;  Location: [place];  Service: Open Heart Surgery;  Laterality: N/A;  . TUMOR REMOVAL  2019   Bladder    Prior to  Admission medications   Medication Sig Start Date End Date Taking? Authorizing Provider  amLODipine (NORVASC) 10 MG tablet Take 1 tablet (10 mg total) by mouth daily. 01/01/20   Fritzi Mandes, MD  aspirin EC 81 MG tablet Take 81 mg by mouth daily.    [name], MD  atorvastatin (LIPITOR) 40 MG tablet Take 1 tablet (40 mg total) by mouth daily. 01/01/20   Fritzi Mandes, MD  calcitRIOL (ROCALTROL) 0.5 MCG capsule Take 1 capsule (0.5 mcg total) by mouth daily. 01/01/20   Fritzi Mandes, MD  calcium acetate (PHOSLO) 667 MG capsule Take 2 capsules (1,334 mg total) by mouth 3 (three) times daily with meals. 01/01/20   Fritzi Mandes, MD  calcium carbonate (OS-CAL - DOSED IN MG OF ELEMENTAL CALCIUM) 1250 (500 Ca) MG tablet Take 1 tablet (1,250 mg total) by mouth 2 (two) times daily between meals. 01/01/20   Fritzi Mandes, MD  clopidogrel (PLAVIX) 75 MG tablet Take 1 tablet (75 mg total) by mouth daily. 01/01/20   Fritzi Mandes, MD  HYDROcodone-acetaminophen (NORCO) 5-325 MG tablet Take 1-2 tablets by mouth every 6 (six) hours as needed for moderate pain. 01/02/20   Pabon, Diego F, MD  metoprolol succinate (TOPROL-XL) 50 MG 24 hr tablet Take 1 tablet (50 mg total) by mouth daily. Take with or immediately following a meal. 01/01/20   Fritzi Mandes, MD  traMADol (ULTRAM) 50 MG tablet Take 50 mg by mouth 2 (two) times daily as needed (pain.).    [provider]  Vitamin D, Ergocalciferol, (DRISDOL) 1.25 MG (50000 UNIT) CAPS capsule Take 1 capsule (50,000 Units total) by mouth every 7 (seven) days. 01/05/20   Fritzi Mandes, MD    Allergies Patient has no known allergies.  Family History  Family history unknown: Yes    Social History Social History   Tobacco Use  . Smoking status: Former Smoker    Packs/day: 0.25    Types: Cigarettes    Quit date: 02/19/2019    Years since quitting: 0.8  . Smokeless tobacco: Never Used  Vaping Use  . Vaping Use: Never used  Substance Use Topics  . Alcohol use: Never   . Drug use: Never    Review of Systems  Constitutional: No fever/chills Eyes: No visual changes. ENT: No sore throat. Cardiovascular: Denies chest pain. Respiratory: Denies shortness of breath. Gastrointestinal:   No nausea, no vomiting.  No diarrhea.  No constipation. Positive for abdominal pain. Genitourinary: Negative for dysuria. Musculoskeletal: Negative for back pain. Skin: Negative for rash. Neurological: Negative for headaches, focal weakness or numbness.  ____________________________________________   PHYSICAL EXAM:  VITAL SIGNS: Vitals:   01/08/20 0626  BP: (!) 186/120  Pulse: 78  Resp: 16  Temp: 97.7 F (36.5 C)  SpO2: 96%     Constitutional: Alert and oriented. Well appearing and in no acute distress. Eyes: Conjunctivae are normal. PERRL. EOMI. Head: Atraumatic. Nose: No congestion/rhinnorhea. Mouth/Throat: Mucous membranes are moist.  Oropharynx non-erythematous. Neck: No stridor. No cervical spine tenderness to palpation. Cardiovascular: Normal rate, regular rhythm. Grossly normal heart sounds.  Good peripheral circulation. Respiratory: Normal respiratory effort.  No retractions. Lungs CTAB. Gastrointestinal:  Soft , nondistended. No CVA tenderness. PD catheter in place with clean insertion site without erythema, induration or purulence. Musculoskeletal: No lower extremity tenderness nor edema.  No joint effusions. No signs of acute trauma. Neurologic:  Normal speech and language. No gross focal neurologic deficits are appreciated. No gait instability noted. Skin:  Skin is warm, dry and intact. No rash noted. Psychiatric: Mood and affect are normal. Speech and behavior are normal.  ____________________________________________   LABS (all labs ordered are listed, but only abnormal results are displayed)  Labs from previous encounter reviewed, culture still in process.  Gram stain without organisms.  Cell count elevated with cloudy peritoneal  fluid. ____________________________________________   PROCEDURES and INTERVENTIONS  Procedure(s) performed (including Critical Care):  Procedures  Medications  cefTAZidime (FORTAZ) 200 mg/mL injection 1,000 mg (has no administration in time range)    ____________________________________________   MDM / ED COURSE   64 year old male presents to the ED with generalized abdominal pain in the setting of recent PD catheter placement, with evidence of peritonitis requiring intraperitoneal antibiotics and medical admission.  Hemodynamically stable without fever or tachycardia.  Exam without peritoneal tenderness in a well-appearing patient.  Cell counts from his dialysate reviewed and concerning for peritonitis.  Consulted pharmacy for assistance with ordering intraperitoneal antibiotics, and we will provide ceftazidime for this.  Will admit to hospitalist medicine for further work-up and management.   Clinical Course as of Jan 08 640  Tue Jan 08, 2020  0632 Called pharmacy to help with ordering intra-peritoneal rocephin to be delivered in a luer-lock syringe   [DS]  [phone] We do not readily have Rocephin available for this purpose, but have ceftazidime.  I order 1 g of this to be delivered in a Luer-Lok syringe to be provided intraperitoneally.   [DS]    Clinical Course User Index [DS] Vladimir Crofts, MD    ____________________________________________   FINAL CLINICAL IMPRESSION(S) / ED DIAGNOSES  Final diagnoses:  Peritoneal dialysis-associated peritonitis, initial encounter [facility])     ED Discharge Orders    None       Anthony Hess   Note:  This document was prepared using Dragon voice recognition software and may include unintentional dictation errors.   Vladimir Crofts, MD 01/08/20 [phone]

## 2020-01-08 NOTE — Telephone Encounter (Signed)
Patient's significant other notified of Jolene's message.

## 2020-01-08 NOTE — ED Notes (Signed)
MD in room to advise pt of new test results and informed pt of need to stay and be admitted for treatment. Pt states he wishes to leave a this time and come back after receiving his regular dialysis treatment and taking care of his dog at home. MD and RN strongly encouraged pt to stay. Pt still wishes to leave at this time and pt signed AMA form.

## 2020-01-08 NOTE — Progress Notes (Signed)
New hemodialysis patient known from last admission. Started at [facility] 6:00am on 11/11. Patient transports by  self or family. Please contact me with any dialysis placement concerns.  Elvera Bicker Dialysis Coordinator [phone]

## 2020-01-08 NOTE — Consult Note (Signed)
Pharmacy Antibiotic Note  Anthony Hess is a 64 y.o. male admitted on 01/08/2020 with Peritonitis.  Pharmacy has been consulted for Vancomycin & Ceftazidime dosing.  Plan:  Dosing below is for HD scheme until PD plan is determined. Ceftazidime 1g qHD session (re-evaluate at 24h) Vancomycin 1.25g x1 Loading dose (~20mg /kg); followed by maintenance dose of 750mg  (~12mg /kg) with each HD session. (re-evaluate at 24h)  Height: 5\' 4"  (162.6 cm) Weight: 59.4 kg (131 lb) IBW/kg (Calculated) : 59.2  Temp (24hrs), Avg:97.8 F (36.6 C), Min:97.6 F (36.4 C), Max:98 F (36.7 C)  Recent Labs  Lab 01/04/20 1540 01/04/20 1542 01/07/20 2257 01/08/20 0928  WBC 6.8  --  8.9 8.1  CREATININE  --  4.59* 5.53* 5.62*    Estimated Creatinine Clearance: 11.6 mL/min (A) (by C-G formula based on SCr of 5.62 mg/dL (H)).    No Known Allergies  Antimicrobials this admission: Ceftazadime 11/16 >> Vancomycin 11/16 >>  Dose adjustments this admission: Currently receiving HD; then will transition to PD access with intraperitoneal antibiotics.  Microbiology results: Flu A/B - Neg Sars-Cov 2 - Neg  Thank you for allowing pharmacy to be a part of this patient's care.  Anthony Hess 01/08/2020 2:19 PM

## 2020-01-08 NOTE — Discharge Instructions (Signed)
As we discussed, your abdominal dialysis fluid had to be sent to [facility] for appropriate lab testing.  If there are any significant problems with this, you should get a phone call with recommendations.  Like we talked about, if you develop any significantly worsening abdominal pain, fevers, throwing up please return to the ED immediately.  Otherwise, I recommend you follow-up with your primary care physician or your kidney doctor as soon as you are able to, ideally later this week, discuss your continued pain and to follow-up on these lab tests.

## 2020-01-08 NOTE — Telephone Encounter (Signed)
Copied from [place] [id number]. Topic: General - Other >> Jan 08, 2020  9:45 AM Antonieta Iba C wrote: Reason for CRM: pt's spouse called in for assistance. She says that pt took providers advise and is currently in the hospital. Spouse would like to know if provider could review pt's meds because she feels that some of his meds could be counter acting.   [phone]

## 2020-01-08 NOTE — H&P (Signed)
History and Physical    Anthony Hess ALP:[id number] DOB: Oct 10, 1955 DOA: 01/08/2020  PCP: Anthony American, PA-C   Patient coming from: Home  I have personally briefly reviewed patient's old medical records in [place]  Chief Complaint: Abdominal pain  HPI: Anthony Hess is a 64 y.o. male with medical history significant for end-stage renal disease on hemodialysis (replacement therapy was started on 12/28/19 and dialysis days are T/TH/S, chronic systolic heart failure last known LVEF of 20%, coronary artery disease, hypertension and dyslipidemia who presents to the ER for evaluation of pain around the site of insertion of fluids his peritoneal dialysis catheter.  Peritoneal dialysis catheter was placed on 12/31/19.  He complains of abdominal pain which he rates a 5 x 10 in intensity at its worst with no radiation and denies having any nausea, no vomiting, no changes in his bowel habits, no fever or chills. He denies having any chest pain, no shortness of breath, no cough, no headaches, no dizziness or lightheadedness.  Excuse me He was advised to come to the emergency room by the PD nurse who had flushed his PD catheter. Labs show sodium 144, potassium 4.5, chloride 109, bicarb 21, glucose 112, BUN 68, creatinine 5.53, calcium 6.7, white count 8.9, hemoglobin 8.6, hematocrit 28.1, MCV 94.0, RDW 15.7, platelet count 242 Respiratory viral panel is negative Peritoneal fluid was cloudy and had about 2000 nucleated cells Twelve-lead EKG reviewed by me shows normal sinus rhythm   ED Course: Patient is a 64 year old Caucasian male who was recently started on renal replacement therapy during his last hospitalization through a PermCath but had a PD catheter inserted due to patient's preference for peritoneal dialysis who presents to the ER for evaluation of abdominal pain mostly around site of insertion of the PD catheter.  PD fluid has about 2000 nucleated cells concerning for an  infection.  Patient be admitted to the hospital for further evaluation.  Review of Systems: As per HPI otherwise 10 point review of systems negative.    Past Medical History:  Diagnosis Date  . Bladder tumor   . Cancer [facility])    Bladder  . Chronic kidney disease    renal insufficiency  . Coronary artery disease   . History of kidney stones   . Hypertension   . Myocardial infarction ([place])   . Wears glasses     Past Surgical History:  Procedure Laterality Date  . CAPD INSERTION N/A 12/31/2019   Procedure: LAPAROSCOPIC INSERTION CONTINUOUS AMBULATORY PERITONEAL DIALYSIS  (CAPD) CATHETER;  Surgeon: Anthony Husbands, MD;  Location: [facility] ORS;  Service: General;  Laterality: N/A;  . CORONARY ARTERY BYPASS GRAFT N/A 12/27/2018   Procedure: CORONARY ARTERY BYPASS GRAFTING (CABG) X 4 ON PUMP USING RIGHT & LEFT INTERNAL MAMMARY ARTERY LEFT RADIAL ARTERY ENDOSCOPICALLY HARVESTED;  Surgeon: Anthony Olds, MD;  Location: [place];  Service: Open Heart Surgery;  Laterality: N/A;  . CYSTOSCOPY W/ RETROGRADES Bilateral 05/15/2019   Procedure: CYSTOSCOPY WITH RETROGRADE PYELOGRAM;  Surgeon: Anthony Sons, MD;  Location: [facility];  Service: Urology;  Laterality: Bilateral;  . CYSTOSCOPY WITH BIOPSY N/A 05/15/2019   Procedure: CYSTOSCOPY WITH bladder BIOPSY;  Surgeon: Anthony Sons, MD;  Location: [facility];  Service: Urology;  Laterality: N/A;  . DIALYSIS/PERMA CATHETER INSERTION N/A 12/28/2019   Procedure: DIALYSIS/PERMA CATHETER INSERTION;  Surgeon: Anthony Huxley, MD;  Location: [place] CV LAB;  Service: Cardiovascular;  Laterality: N/A;  . LEFT HEART CATH AND CORONARY ANGIOGRAPHY Left 12/20/2018  Procedure: LEFT HEART CATH AND CORONARY ANGIOGRAPHY;  Surgeon: Anthony Cowman, MD;  Location: [place] CV LAB;  Service: Cardiovascular;  Laterality: Left;  . RADIAL ARTERY HARVEST Left 12/27/2018   Procedure: ENDOSCOPIC RADIAL ARTERY HARVEST;  Surgeon: Anthony Olds, MD;  Location: [place];  Service: Open Heart Surgery;  Laterality: Left;  . TEE WITHOUT CARDIOVERSION N/A 12/27/2018   Procedure: TRANSESOPHAGEAL ECHOCARDIOGRAM (TEE);  Surgeon: Anthony Olds, MD;  Location: [place];  Service: Open Heart Surgery;  Laterality: N/A;  . TUMOR REMOVAL  2019   Bladder     reports that he quit smoking about 10 months ago. His smoking use included cigarettes. He smoked 0.25 packs per day. He has never used smokeless tobacco. He reports that he does not drink alcohol and does not use drugs.  No Known Allergies  Family History  Family history unknown: Yes     Prior to Admission medications   Medication Sig Start Date End Date Taking? Authorizing Provider  amLODipine (NORVASC) 10 MG tablet Take 1 tablet (10 mg total) by mouth daily. 01/01/20   Anthony Mandes, MD  aspirin EC 81 MG tablet Take 81 mg by mouth daily.    Anthony Olds, MD  atorvastatin (LIPITOR) 40 MG tablet Take 1 tablet (40 mg total) by mouth daily. 01/01/20   Anthony Mandes, MD  calcitRIOL (ROCALTROL) 0.5 MCG capsule Take 1 capsule (0.5 mcg total) by mouth daily. 01/01/20   Anthony Mandes, MD  calcium acetate (PHOSLO) 667 MG capsule Take 2 capsules (1,334 mg total) by mouth 3 (three) times daily with meals. 01/01/20   Anthony Mandes, MD  calcium carbonate (OS-CAL - DOSED IN MG OF ELEMENTAL CALCIUM) 1250 (500 Ca) MG tablet Take 1 tablet (1,250 mg total) by mouth 2 (two) times daily between meals. 01/01/20   Anthony Mandes, MD  clopidogrel (PLAVIX) 75 MG tablet Take 1 tablet (75 mg total) by mouth daily. 01/01/20   Anthony Mandes, MD  HYDROcodone-acetaminophen (NORCO) 5-325 MG tablet Take 1-2 tablets by mouth every 6 (six) hours as needed for moderate pain. 01/02/20   Hess, Anthony F, MD  metoprolol succinate (TOPROL-XL) 50 MG 24 hr tablet Take 1 tablet (50 mg total) by mouth daily. Take with or immediately following a meal. 01/01/20   Anthony Mandes, MD  traMADol (ULTRAM) 50 MG tablet Take 50 mg by mouth 2 (two) times daily as needed (pain.).     [provider]  Vitamin D, Ergocalciferol, (DRISDOL) 1.25 MG (50000 UNIT) CAPS capsule Take 1 capsule (50,000 Units total) by mouth every 7 (seven) days. 01/05/20   Anthony Mandes, MD    Physical Exam: Vitals:   01/08/20 0626 01/08/20 0628 01/08/20 0730 01/08/20 0808  BP: (!) 186/120  (!) 170/97 (!) 184/103  Pulse: 78  61 60  Resp: 16  15 16   Temp: 97.7 F (36.5 C)   97.8 F (36.6 C)  TempSrc: Oral   Oral  SpO2: 96%  98% 99%  Weight:  59.4 kg    Height:  5\' 4"  (1.626 m)       Vitals:   01/08/20 0626 01/08/20 0628 01/08/20 0730 01/08/20 0808  BP: (!) 186/120  (!) 170/97 (!) 184/103  Pulse: 78  61 60  Resp: 16  15 16   Temp: 97.7 F (36.5 C)   97.8 F (36.6 C)  TempSrc: Oral   Oral  SpO2: 96%  98% 99%  Weight:  59.4 kg    Height:  5\' 4"  (1.626 m)  Constitutional: NAD, alert and oriented x 3 Eyes: PERRL, lids and conjunctivae pallor ENMT: Mucous membranes are moist.  Neck: normal, supple, no masses, no thyromegaly Respiratory: clear to auscultation bilaterally, no wheezing, no crackles. Normal respiratory effort. No accessory muscle use.  Cardiovascular: Regular rate and rhythm, no murmurs / rubs / gallops. No extremity edema. 2+ pedal pulses. No carotid bruits. Permcath in the right ACW Abdomen: tenderness around the PD catheter insertion site , no masses palpated. No hepatosplenomegaly. Bowel sounds positive. Dressing over PD catheter  Musculoskeletal: no clubbing / cyanosis. No joint deformity upper and lower extremities.  Skin: no rashes, lesions, ulcers.  Neurologic: No gross focal neurologic deficit. Psychiatric: Normal mood and affect.   Labs on Admission: I have personally reviewed following labs and imaging studies  CBC: Recent Labs  Lab 01/04/20 1540 01/07/20 2257  WBC 6.8 8.9  NEUTROABS 5.0 5.4  HGB 9.1* 8.6*  HCT 29.6* 28.1*  MCV 94 94.0  PLT 191 193   Basic Metabolic Panel: Recent Labs  Lab 01/04/20 1542 01/07/20 2257  NA 143 144   K 4.2 4.5  CL 105 109  CO2 22 21*  GLUCOSE 122* 112*  BUN 52* 68*  CREATININE 4.59* 5.53*  CALCIUM 7.1* 6.7*   GFR: Estimated Creatinine Clearance: 11.7 mL/min (A) (by C-G formula based on SCr of 5.53 mg/dL (H)). Liver Function Tests: Recent Labs  Lab 01/04/20 1542  AST 30  ALT 8  ALKPHOS 67  BILITOT <0.2  PROT 5.7*  ALBUMIN 3.5*   No results for input(s): LIPASE, AMYLASE in the last 168 hours. No results for input(s): AMMONIA in the last 168 hours. Coagulation Profile: No results for input(s): INR, PROTIME in the last 168 hours. Cardiac Enzymes: No results for input(s): CKTOTAL, CKMB, CKMBINDEX, TROPONINI in the last 168 hours. BNP (last 3 results) No results for input(s): PROBNP in the last 8760 hours. HbA1C: No results for input(s): HGBA1C in the last 72 hours. CBG: No results for input(s): GLUCAP in the last 168 hours. Lipid Profile: No results for input(s): CHOL, HDL, LDLCALC, TRIG, CHOLHDL, LDLDIRECT in the last 72 hours. Thyroid Function Tests: No results for input(s): TSH, T4TOTAL, FREET4, T3FREE, THYROIDAB in the last 72 hours. Anemia Panel: No results for input(s): VITAMINB12, FOLATE, FERRITIN, TIBC, IRON, RETICCTPCT in the last 72 hours. Urine analysis:    Component Value Date/Time   COLORURINE STRAW (A) 12/27/2019 1756   APPEARANCEUR CLEAR (A) 12/27/2019 1756   APPEARANCEUR Cloudy (A) 05/08/2019 1448   LABSPEC 1.010 12/27/2019 1756   PHURINE 5.0 12/27/2019 1756   GLUCOSEU NEGATIVE 12/27/2019 1756   HGBUR NEGATIVE 12/27/2019 1756   BILIRUBINUR NEGATIVE 12/27/2019 1756   BILIRUBINUR Negative 05/08/2019 1448   KETONESUR NEGATIVE 12/27/2019 1756   PROTEINUR >=300 (A) 12/27/2019 1756   NITRITE NEGATIVE 12/27/2019 1756   LEUKOCYTESUR NEGATIVE 12/27/2019 1756    Radiological Exams on Admission: No results found.  EKG: Independently reviewed.  Sinus rhythm  Assessment/Plan Principal Problem:   Acute peritonitis ([place]) Active Problems:   History  of bladder cancer   Emphysema lung ([facility])   S/P CABG x 4   ESRD (end stage renal disease) ([facility])   Anemia in ESRD (end-stage renal disease) ([place])      Acute peritonitis Patient is status post recent PD catheter insertion for peritoneal dialysis and presents for evaluation of abdominal pain mostly around the insertion of the PD catheter Peritoneal fluid yields about 2000 nucleated cells We will place patient empirically on vancomycin and Tressie Ellis  Follow-up results of PD fluid culture    Hypertension Continue amlodipine and metoprolol   History of coronary artery disease Status post CABG Continue aspirin, Plavix, metoprolol and statins    End-stage renal disease  Patient was recently started on renal replacement therapy prior to initiation of peritoneal dialysis Dialysis days are T/TH/S Will consult nephrology    Chronic systolic heart failure Last known LVEF is 20% Continue renal replacement therapy for fluid management Optimize blood pressure control   DVT prophylaxis: Heparin Code Status: Full code Family Communication: Greater than 50% of time was spent discussing plan of care with patient at the bedside.  All questions and concerns have been addressed.  He verbalizes understanding and agrees with the plan. Disposition Plan: Back to previous home environment Consults called: Nephrology    Collier Bullock MD [facility]     01/08/2020, 9:03 AM

## 2020-01-08 NOTE — ED Triage Notes (Signed)
See prior triage note.  Pt returned after leaving AMA.  Here for pain around peritoneal dialysis catheter that was placed last Saturday.  No changes since leaving.  Pt A&Ox4, chest rise even and unlabored, in NAD at this time.

## 2020-01-08 NOTE — Progress Notes (Signed)
[facility]  ROUNDING NOTE   Subjective:   [name] was admitted to [facility] on 01/08/2020 for Acute peritonitis ([place]) [K65.0] Peritoneal dialysis-associated peritonitis, initial encounter [facility]) [T85.71XA]  Patient known to our practice from previous admission, with h/o ESRD, started  hemodialysis on 12/28/2019. Patient has a peritoneal dialysis catheter placed on 12/31/2019 as he and his wife preferred PD, and was getting trained as outpatient on management of PD. Patient reports getting his PD catheter flushed on Saturday along with receiving his last HD session. He noticed soreness and redness around  the PD catheter area, so presented to the ED today,denies fever/chills. We will plan for hemodialysis today,orders placed.  Objective:  Vital signs in last 24 hours:  Temp:  [97.7 F (36.5 C)-98 F (36.7 C)] 97.8 F (36.6 C) (11/16 0808) Pulse Rate:  [60-85] 60 (11/16 0808) Resp:  [15-17] 16 (11/16 0808) BP: (170-189)/(97-120) 174/105 (11/16 0905) SpO2:  [95 %-99 %] 99 % (11/16 0808) Weight:  [59.4 kg] 59.4 kg (11/16 0628)  Weight change:  Filed Weights   01/08/20 0628  Weight: 59.4 kg    Intake/Output: No intake/output data recorded.   Intake/Output this shift:  No intake/output data recorded.  Physical Exam: General: Resting in bed, in no acute distress  Head: Normocephalic, atraumatic  Eyes: Anicteric  Lungs:  Respirations symmetrical, unlabored  Heart: Regular rate and rhythm   Abdomen:  Soft, PD catheter site with mild erythema and tenderness  Extremities: No peripheral edema  Neurologic: Oriented x 3  Skin: No acute lesions or rashes   Access Rt IJ PC / Dew/ 12/28/2019,PD catheter    Basic Metabolic Panel: Recent Labs  Lab 01/04/20 1542 01/07/20 2257 01/08/20 0928  NA 143 144 144  K 4.2 4.5 4.5  CL 105 109 109  CO2 22 21* 22  GLUCOSE 122* 112* 87  BUN 52* 68* 69*  CREATININE 4.59* 5.53* 5.62*  CALCIUM 7.1* 6.7* 6.5*  PHOS  --   --   7.0*    Liver Function Tests: Recent Labs  Lab 01/04/20 1542 01/08/20 0928  AST 30  --   ALT 8  --   ALKPHOS 67  --   BILITOT <0.2  --   PROT 5.7*  --   ALBUMIN 3.5* 3.0*   No results for input(s): LIPASE, AMYLASE in the last 168 hours. No results for input(s): AMMONIA in the last 168 hours.  CBC: Recent Labs  Lab 01/04/20 1540 01/07/20 2257 01/08/20 0928  WBC 6.8 8.9 8.1  NEUTROABS 5.0 5.4  --   HGB 9.1* 8.6* 9.0*  HCT 29.6* 28.1* 28.9*  MCV 94 94.0 92.6  PLT 191 242 233    Cardiac Enzymes: No results for input(s): CKTOTAL, CKMB, CKMBINDEX, TROPONINI in the last 168 hours.  BNP: Invalid input(s): POCBNP  CBG: No results for input(s): GLUCAP in the last 168 hours.  Microbiology: Results for orders placed or performed during the hospital encounter of 01/08/20  Respiratory Panel by RT PCR (Flu A&B, Covid) - Nasopharyngeal Swab     Status: None   Collection Time: 01/08/20  6:46 AM   Specimen: Nasopharyngeal Swab  Result Value Ref Range Status   SARS Coronavirus 2 by RT PCR NEGATIVE NEGATIVE Final    Comment: (NOTE) SARS-CoV-2 target nucleic acids are NOT DETECTED.  The SARS-CoV-2 RNA is generally detectable in upper respiratoy specimens during the acute phase of infection. The lowest concentration of SARS-CoV-2 viral copies this assay can detect is 131 copies/mL. A  negative result does not preclude SARS-Cov-2 infection and should not be used as the sole basis for treatment or other patient management decisions. A negative result may occur with  improper specimen collection/handling, submission of specimen other than nasopharyngeal swab, presence of viral mutation(s) within the areas targeted by this assay, and inadequate number of viral copies (<131 copies/mL). A negative result must be combined with clinical observations, patient history, and epidemiological information. The expected result is Negative.  Fact Sheet for Patients:   PinkCheek.be  Fact Sheet for Healthcare Providers:  GravelBags.it  This test is no t yet approved or cleared by the [place] FDA and  has been authorized for detection and/or diagnosis of SARS-CoV-2 by FDA under an Emergency Use Authorization (EUA). This EUA will remain  in effect (meaning this test can be used) for the duration of the COVID-19 declaration under Section 564(b)(1) of the Act, 21 U.S.C. section 360bbb-3(b)(1), unless the authorization is terminated or revoked sooner.     Influenza A by PCR NEGATIVE NEGATIVE Final   Influenza B by PCR NEGATIVE NEGATIVE Final    Comment: (NOTE) The Xpert Xpress SARS-CoV-2/FLU/RSV assay is intended as an aid in  the diagnosis of influenza from Nasopharyngeal swab specimens and  should not be used as a sole basis for treatment. Nasal washings and  aspirates are unacceptable for Xpert Xpress SARS-CoV-2/FLU/RSV  testing.  Fact Sheet for Patients: PinkCheek.be  Fact Sheet for Healthcare Providers: GravelBags.it  This test is not yet approved or cleared by the [place] FDA and  has been authorized for detection and/or diagnosis of SARS-CoV-2 by  FDA under an Emergency Use Authorization (EUA). This EUA will remain  in effect (meaning this test can be used) for the duration of the  Covid-19 declaration under Section 564(b)(1) of the Act, 21  U.S.C. section 360bbb-3(b)(1), unless the authorization is  terminated or revoked. Performed at [facility], [address] [place], [place] [address]     Coagulation Studies: No results for input(s): LABPROT, INR in the last 72 hours.  Urinalysis: No results for input(s): COLORURINE, LABSPEC, PHURINE, GLUCOSEU, HGBUR, BILIRUBINUR, KETONESUR, PROTEINUR, UROBILINOGEN, NITRITE, LEUKOCYTESUR in the last 72 hours.  Invalid input(s): APPERANCEUR    Imaging: No  results found.   Medications:   . sodium chloride    . sodium chloride    . sodium chloride    . cefTAZidime (FORTAZ)  IV     . amLODipine  10 mg Oral Daily  . aspirin EC  81 mg Oral Daily  . atorvastatin  40 mg Oral Daily  . calcitRIOL  0.5 mcg Oral Daily  . calcium acetate  1,334 mg Oral TID WC  . calcium carbonate  1,250 mg Oral BID BM  . cefTAZidime  1,000 mg Intraperitoneal Once in dialysis  . Chlorhexidine Gluconate Cloth  6 each Topical Q0600  . clopidogrel  75 mg Oral Daily  . epoetin (EPOGEN/PROCRIT) injection  4,000 Units Intravenous Q T,Th,Sa-HD  . heparin  5,000 Units Subcutaneous Q8H  . metoprolol succinate  50 mg Oral Daily  . sodium chloride flush  3 mL Intravenous Q12H  . Vitamin D (Ergocalciferol)  50,000 Units Oral Q7 days     Assessment/ Plan:  Mr. Alija Riano is a 64 y.o. white male with hypertension, coronary artery disease, nephrolithiasis, history of bladder cancer who is admitted to [facility] on 01/08/2020 for Acute peritonitis ([place]) [K65.0] Peritoneal dialysis-associated peritonitis, initial encounter ([place]) [T85.71XA]  # ESRD  baseline creatinine of 3.26,  GFR of 19 on 06/03/2019.  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   Patient received his last HD session as outpatient on Saturday We will plan for hemodialysis today Orders placed    #Acute Peritonitis Patient coming with c/o erythema and tenderness on peritoneal catheter insertion site  Started on antibiotics from ED  #Anemia with kidney disease Lab Results  Component Value Date   HGB 9.0 (L) 01/08/2020   Started Epogen with dialysis  #Hypocalcemia in setting of secondary hyperparathyroidism Lab Results  Component Value Date   PTH 159 (H) 12/27/2019   CALCIUM 6.5 (L) 01/08/2020   CAION 1.07 (L) 05/15/2019   PHOS 7.0 (H) 01/08/2020  Hypocalcemic Patient is on Calcium and Vit D supplements  He is also on Phoslo     LOS: 0 Ginia Rudell 11/16/202110:58 AM

## 2020-01-09 DIAGNOSIS — Z951 Presence of aortocoronary bypass graft: Secondary | ICD-10-CM | POA: Diagnosis not present

## 2020-01-09 DIAGNOSIS — I10 Essential (primary) hypertension: Secondary | ICD-10-CM

## 2020-01-09 DIAGNOSIS — N186 End stage renal disease: Secondary | ICD-10-CM

## 2020-01-09 DIAGNOSIS — D631 Anemia in chronic kidney disease: Secondary | ICD-10-CM | POA: Diagnosis not present

## 2020-01-09 DIAGNOSIS — Z8551 Personal history of malignant neoplasm of bladder: Secondary | ICD-10-CM

## 2020-01-09 LAB — CBC
HCT: 32 % — ABNORMAL LOW (ref 39.0–52.0)
Hemoglobin: 10.1 g/dL — ABNORMAL LOW (ref 13.0–17.0)
MCH: 28.9 pg (ref 26.0–34.0)
MCHC: 31.6 g/dL (ref 30.0–36.0)
MCV: 91.7 fL (ref 80.0–100.0)
Platelets: 246 10*3/uL (ref 150–400)
RBC: 3.49 MIL/uL — ABNORMAL LOW (ref 4.22–5.81)
RDW: 15.6 % — ABNORMAL HIGH (ref 11.5–15.5)
WBC: 8.7 10*3/uL (ref 4.0–10.5)
nRBC: 0 % (ref 0.0–0.2)

## 2020-01-09 LAB — BASIC METABOLIC PANEL
Anion gap: 13 (ref 5–15)
BUN: 43 mg/dL — ABNORMAL HIGH (ref 8–23)
CO2: 26 mmol/L (ref 22–32)
Calcium: 7.4 mg/dL — ABNORMAL LOW (ref 8.9–10.3)
Chloride: 102 mmol/L (ref 98–111)
Creatinine, Ser: 4.19 mg/dL — ABNORMAL HIGH (ref 0.61–1.24)
GFR, Estimated: 15 mL/min — ABNORMAL LOW (ref >60)
Glucose, Bld: 96 mg/dL (ref 70–99)
Potassium: 4.5 mmol/L (ref 3.5–5.1)
Sodium: 141 mmol/L (ref 135–145)

## 2020-01-09 LAB — PATHOLOGIST SMEAR REVIEW

## 2020-01-09 MED ORDER — SODIUM CHLORIDE 0.9 % IV SOLN
1.0000 g | INTRAVENOUS | Status: DC | PRN
  Filled 2020-01-09: qty 1

## 2020-01-09 MED ORDER — SODIUM CHLORIDE 0.9 % IV SOLN: 1.5000 g | Freq: Four times a day (QID) | INTRAVENOUS | Status: DC

## 2020-01-09 MED ORDER — VANCOMYCIN HCL 750 MG/150ML IV SOLN
750.0000 mg | INTRAVENOUS | Status: DC | PRN
  Filled 2020-01-09: qty 150

## 2020-01-09 NOTE — Plan of Care (Signed)
Patient discharging to home with family.  Patient alert, oriented and independent in room.  PD cath dressing changed prior to leaving, no drainage or redness noted to site.  PIV removed.  Discharge instructions, and appointment reminders reviewed with patient prior to discharge and he verbalized understanding.  No changes made to medication list.  Patient notifying family for transport.  Patient awaiting ride.

## 2020-01-09 NOTE — Consult Note (Signed)
[name]  DOB: [birth year]  MRN: [id number]  Date/Time: 01/09/2020 1:08 PM  REQUESTING PROVIDER Dr. Jimmye Norman Subjective:  REASON FOR CONSULT: Peritonitis ? Anthony Hess is a 64 y.o. male with a history of bladder carcinoma causing bilateral hydronephrosis leading to End-stage renal disease and now  on dialysis, coronary artery disease status post CABG in 2020, hypertension, severe cardiomyopathy with acute on chronic CHF Presented  to the ED on 01/07/2020 after his nurse asked him to go to the ED as she thought the PD catheter may not have been placed right.  Patient had a catheter placed recently on 12/31/2019.  He has not started peritoneal dialysis yet.   patient had minimal pain at the site of the PD catheter.  There was no redness, no discharge or no fever.   He started hemodialysis for the first time during his last admission which was between11/05/2019 01/01/2020 when he presented with high blood pressure .  He was found to have AKI on CKD.  Creatinine was 6.35, calcium 5.3, K of 4.7.  He has a history of bladder cancer with bilateral hydronephrosis and hydroureter.  On 12/28/2019 a permacath was placed and he was started on dialysis. As he preferred to have peritoneal dialysis catheter was placed on 12/31/2019.  He was discharged after his blood pressure was under control.  He had a regular visit by the nurse and she found that the catheter may not have been placed correctly and sent to the ED. In the ED his vitals were temperature of 97.9, blood pressure of 186/114, heart rate of 82.  He underwent paracentesis and the fluid count was WBC of 2000 with 3% neutrophils.  Started on vancomycin and ceftazidime Both the blood culture and the peritoneal fluid culture has been negative.  I am asked to see the patient to rule out any infection.    As per his urological history he was hospitalized December 2018 in [place] for hematuria and acute kidney injury and CT showing bilateral  hydronephrosis and hyperdense material in the bladder with an antecedent 1 year history of intermittent gross hematuria and voiding difficulty.  Initially bilateral  percutaneous nephrostomy tubes were placed on 02/07/2017 that hospitalization.  Cystoscopy was done with clot evacuation.  TURBT performed 02/09/2017.  Intraoperative findings were extensive papillary tumor estimated more than 5 cm occupying the entire trigone and obscuring both ureteral orifice and the tumor was incompletely resected.  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  Then a repeat TURBT on 03/15/2017 at [facility] showed extensive tumor over the right lateral wall and dome.  About 90% of the tumor was resected.  The pathology showed low-grade urothelial carcinoma with equivocal subepithelial invasion.  Then in April 06, 2017 the nephrostomy tubes were converted into bilateral ureteral stents.  And in April 25, 2017 a 4 cm papillary tumor on the posterior wall of the bladder wall was entirely resected. Ureteral stents were removed on 05/02/2017. A 6-week course of intravesical BCG recommended however patient did not follow-up and he moved to [place].  In August 2020 he saw Dr. Bernardo Heater under internal office cystoscopy was suboptimal.  He was scheduled to have a cystoscopy under anesthesia with possible bladder biopsy/TURBT however his preop EKG was abnormal and Myoview showed less than 30% EF with anterior apical and septal scars.  Cardiac catheterization showed significant two-vessel coronary artery disease and subsequently underwent CABG in [facility] on 12/27/2018. Once he was cleared by cardiac surgeon he underwent cystoscopy  with bladder biopsies on 05/15/2019.  There was no solid or papillary bladder lesion seen.  Cold biopsies were taken.  That biopsy showed urothelial mucosa with focal cystitis cystica.  There was no malignancy.   Past Medical History:  Diagnosis Date  . Bladder tumor   . Cancer [facility])      Bladder  . Chronic kidney disease    renal insufficiency  . Coronary artery disease   . History of kidney stones   . Hypertension   . Myocardial infarction ([place])   . Wears glasses     Past Surgical History:  Procedure Laterality Date  . CAPD INSERTION N/A 12/31/2019   Procedure: LAPAROSCOPIC INSERTION CONTINUOUS AMBULATORY PERITONEAL DIALYSIS  (CAPD) CATHETER;  Surgeon: Jules Husbands, MD;  Location: [facility] ORS;  Service: General;  Laterality: N/A;  . CORONARY ARTERY BYPASS GRAFT N/A 12/27/2018   Procedure: CORONARY ARTERY BYPASS GRAFTING (CABG) X 4 ON PUMP USING RIGHT & LEFT INTERNAL MAMMARY ARTERY LEFT RADIAL ARTERY ENDOSCOPICALLY HARVESTED;  Surgeon: [name], MD;  Location: [place];  Service: Open Heart Surgery;  Laterality: N/A;  . CYSTOSCOPY W/ RETROGRADES Bilateral 05/15/2019   Procedure: CYSTOSCOPY WITH RETROGRADE PYELOGRAM;  Surgeon: Abbie Sons, MD;  Location: [facility] ORS;  Service: Urology;  Laterality: Bilateral;  . CYSTOSCOPY WITH BIOPSY N/A 05/15/2019   Procedure: CYSTOSCOPY WITH bladder BIOPSY;  Surgeon: Abbie Sons, MD;  Location: [facility] OR[facility];  Service: Urology;  Laterality: N/A;  . DIALYSIS/PERMA CATHETER INSERTION N/A 12/28/2019   Procedure: DIALYSIS/PERMA CATHETER INSERTION;  Surgeon: Algernon Huxley, MD;  Location: [place] [facility];  Service: Cardiovascular;  Laterality: N/A;  . LEFT HEART CATH AND CORONARY ANGIOGRAPHY Left 12/20/2018   Procedure: LEFT HEART CATH AND CORONARY ANGIOGRAPHY;  Surgeon: Isaias Cowman, MD;  Location: [place] [facility];  Service: Cardiovascular;  Laterality: Left;  . RADIAL ARTERY HARVEST Left 12/27/2018   Procedure: ENDOSCOPIC RADIAL ARTERY HARVEST;  Surgeon: [name], MD;  Location: [place];  Service: Open Heart Surgery;  Laterality: Left;  . TEE WITHOUT CARDIOVERSION N/A 12/27/2018   Procedure: TRANSESOPHAGEAL ECHOCARDIOGRAM (TEE);  Surgeon: [name], MD;  Location: [place];  Service: Open Heart Surgery;   Laterality: N/A;  . TUMOR REMOVAL  2019   Bladder    Social History   Socioeconomic History  . Marital status: Married    Spouse name: Not on file  . Number of children: Not on file  . Years of education: Not on file  . Highest education level: Not on file  Occupational History  . Not on file  Tobacco Use  . Smoking status: Former Smoker    Packs/day: 0.25    Types: Cigarettes    Quit date: 02/19/2019    Years since quitting: 0.8  . Smokeless tobacco: Never Used  Vaping Use  . Vaping Use: Never used  Substance and Sexual Activity  . Alcohol use: Never  . Drug use: Never  . Sexual activity: Yes  Other Topics Concern  . Not on file  Social History Narrative  . Not on file   Social Determinants of Health   Financial Resource Strain:   . Difficulty of Paying Living Expenses: Not on file  Food Insecurity:   . Worried About Charity fundraiser in the Last Year: Not on file  . Ran Out of Food in the Last Year: Not on file  Transportation Needs:   . Lack of Transportation (Medical): Not on file  . Lack of  Transportation (Non-Medical): Not on file  Physical Activity:   . Days of Exercise per Week: Not on file  . Minutes of Exercise per Session: Not on file  Stress:   . Feeling of Stress : Not on file  Social Connections:   . Frequency of Communication with Friends and Family: Not on file  . Frequency of Social Gatherings with Friends and Family: Not on file  . Attends Religious Services: Not on file  . Active Member of Clubs or Organizations: Not on file  . Attends Archivist Meetings: Not on file  . Marital Status: Not on file  Intimate Partner Violence:   . Fear of Current or Ex-Partner: Not on file  . Emotionally Abused: Not on file  . Physically Abused: Not on file  . Sexually Abused: Not on file    Family History  Family history unknown: Yes   No Known Allergies  ? Current Facility-Administered Medications  Medication Dose Route Frequency  Provider Last Rate Last Admin  . 0.9 %  sodium chloride infusion  250 mL Intravenous PRN Agbata, Tochukwu, MD      . amLODipine (NORVASC) tablet 10 mg  10 mg Oral Daily Agbata, Tochukwu, MD   10 mg at 01/09/20 0922  . aspirin EC tablet 81 mg  81 mg Oral Daily Agbata, Tochukwu, MD   81 mg at 01/09/20 0922  . atorvastatin (LIPITOR) tablet 40 mg  40 mg Oral Daily Agbata, Tochukwu, MD   40 mg at 01/09/20 0922  . calcitRIOL (ROCALTROL) capsule 0.5 mcg  0.5 mcg Oral Daily Agbata, Tochukwu, MD   0.5 mcg at 01/09/20 0922  . calcium acetate (PHOSLO) capsule 1,334 mg  1,334 mg Oral TID WC Agbata, Tochukwu, MD   1,334 mg at 01/09/20 1201  . calcium carbonate (OS-CAL - dosed in mg of elemental calcium) tablet 1,250 mg  1,250 mg Oral BID BM Agbata, Tochukwu, MD   1,250 mg at 01/09/20 0922  . Chlorhexidine Gluconate Cloth 2 % PADS 6 each  6 each Topical Q0600 Agbata, Tochukwu, MD   6 each at 01/09/20 0615  . clopidogrel (PLAVIX) tablet 75 mg  75 mg Oral Daily Agbata, Tochukwu, MD   75 mg at 01/09/20 0922  . epoetin alfa (EPOGEN) injection 4,000 Units  4,000 Units Intravenous Q T,Th,Sa-HD Agbata, Tochukwu, MD   4,000 Units at 01/08/20 1156  . heparin injection 5,000 Units  5,000 Units Subcutaneous Q8H Agbata, Tochukwu, MD   5,000 Units at 01/09/20 0614  . HYDROcodone-acetaminophen (NORCO/VICODIN) 5-325 MG per tablet 1-2 tablet  1-2 tablet Oral Q6H PRN Agbata, Tochukwu, MD      . metoprolol succinate (TOPROL-XL) 24 hr tablet 50 mg  50 mg Oral Daily Agbata, Tochukwu, MD   50 mg at 01/09/20 0922  . ondansetron (ZOFRAN) tablet 4 mg  4 mg Oral Q6H PRN Agbata, Tochukwu, MD       Or  . ondansetron (ZOFRAN) injection 4 mg  4 mg Intravenous Q6H PRN Agbata, Tochukwu, MD      . sodium chloride flush (NS) 0.9 % injection 3 mL  3 mL Intravenous Q12H Agbata, Tochukwu, MD   3 mL at 01/09/20 1000  . sodium chloride flush (NS) 0.9 % injection 3 mL  3 mL Intravenous PRN Agbata, Tochukwu, MD      . Vitamin D (Ergocalciferol)  (DRISDOL) capsule 50,000 Units  50,000 Units Oral Q7 days Agbata, Tochukwu, MD   50,000 Units at 01/08/20 1430     Abtx:  Anti-infectives (From admission, onward)   Start     Dose/Rate Route Frequency Ordered Stop   01/09/20 0930  ampicillin-sulbactam (UNASYN) 1.5 g in sodium chloride 0.9 % 100 mL IVPB  Status:  Discontinued        1.5 g 200 mL/hr over 30 Minutes Intravenous Every 6 hours 01/09/20 0839 01/09/20 0911   01/09/20 0856  cefTAZidime (FORTAZ) 1 g in sodium chloride 0.9 % 100 mL IVPB  Status:  Discontinued        1 g 200 mL/hr over 30 Minutes Intravenous Every Dialysis 01/09/20 0857 01/09/20 1249   01/09/20 0854  vancomycin (VANCOREADY) IVPB 750 mg/150 mL  Status:  Discontinued        750 mg 150 mL/hr over 60 Minutes Intravenous Every Dialysis 01/09/20 0857 01/09/20 1249   01/08/20 1400  vancomycin (VANCOREADY) IVPB 1250 mg/250 mL        1,250 mg 166.7 mL/hr over 90 Minutes Intravenous  Once 01/08/20 1309 01/08/20 1803   01/08/20 1130  cefTAZidime (FORTAZ) 1 g in sodium chloride 0.9 % 100 mL IVPB        1 g 200 mL/hr over 30 Minutes Intravenous  Once 01/08/20 1007 01/08/20 1514   01/08/20 0645  cefTAZidime (FORTAZ) 200 mg/mL injection 1,000 mg  Status:  Discontinued        1,000 mg Intraperitoneal Once in dialysis 01/08/20 0635 01/08/20 1424      REVIEW OF SYSTEMS:  Const: negative fever, negative chills, positive weight loss Eyes: negative diplopia or visual changes, negative eye pain ENT: negative coryza, negative sore throat Resp:Cough, no hemoptysis, dyspnea Cards: negative for chest pain, palpitations, lower extremity edema GI: Negative for abdominal pain, diarrhea, bleeding, constipation Skin: negative for rash and pruritus Heme: negative for easy bruising and gum/nose bleeding MS: Weakness Neurolo:negative for headaches, dizziness, vertigo, memory problems  Psych: negative for feelings of anxiety, depression  Endocrine: negative for thyroid,  diabetes Allergy/Immunology- negative for any medication or food allergies ? Objective:  VITALS:  BP (!) 145/97 (BP Location: Right Arm)   Pulse 66   Temp 98.2 F (36.8 C)   Resp 16   Ht 5\' 4"  (1.626 m)   Wt 59.4 kg   SpO2 98%   BMI 22.49 kg/m  PHYSICAL EXAM:  General: Alert, cooperative, no distress, thin built Head: Normocephalic, without obvious abnormality, atraumatic. Eyes: Conjunctivae clear, anicteric sclerae. Pupils are equal ENT Nares normal. No drainage or sinus tenderness. Lips, mucosa, and tongue normal. No Thrush Poor dentition. Neck: Supple, symmetrical, no adenopathy, thyroid: non tender Cystic lesion on the left mandibular area no carotid bruit and no JVD. Back: No CVA tenderness. Lungs: Lateral air entry Sternal scar Heart: Regular rate and rhythm, no murmur, rub or gallop. Hemodialysis catheter on the right IJ Abdomen: Soft, non-tender,not distended. Bowel sounds normal. No masses PD catheter present No redness no discharge no tenderness.    Extremities: atraumatic, no cyanosis. No edema. No clubbing Skin: No rashes or lesions. Or bruising Lymph: Cervical, supraclavicular normal. Neurologic: Grossly non-focal Pertinent Labs Lab Results CBC    Component Value Date/Time   WBC 8.7 01/09/2020 0613   RBC 3.49 (L) 01/09/2020 0613   HGB 10.1 (L) 01/09/2020 0613   HGB 9.1 (L) 01/04/2020 1540   HCT 32.0 (L) 01/09/2020 0613   HCT 29.6 (L) 01/04/2020 1540   PLT 246 01/09/2020 0613   PLT 191 01/04/2020 1540   MCV 91.7 01/09/2020 0613   MCV 94 01/04/2020 1540   MCH 28.9 01/09/2020  2671   MCHC 31.6 01/09/2020 0613   RDW 15.6 (H) 01/09/2020 0613   RDW 16.2 (H) 01/04/2020 1540   LYMPHSABS 1.4 01/07/2020 2257   LYMPHSABS 1.0 01/04/2020 1540   MONOABS 1.1 (H) 01/07/2020 2257   EOSABS 0.9 (H) 01/07/2020 2257   EOSABS 0.5 (H) 03/27/2018 1643   BASOSABS 0.0 01/07/2020 2257   BASOSABS 0.0 03/27/2018 1643    CMP Latest Ref Rng & Units 01/09/2020  01/08/2020 01/07/2020  Glucose 70 - 99 mg/dL 96 87 112(H)  BUN 8 - 23 mg/dL 43(H) 69(H) 68(H)  Creatinine 0.61 - 1.24 mg/dL 4.19(H) 5.62(H) 5.53(H)  Sodium 135 - 145 mmol/L 141 144 144  Potassium 3.5 - 5.1 mmol/L 4.5 4.5 4.5  Chloride 98 - 111 mmol/L 102 109 109  CO2 22 - 32 mmol/L 26 22 21(L)  Calcium 8.9 - 10.3 mg/dL 7.4(L) 6.5(L) 6.7(L)  Total Protein 6.0 - 8.5 g/dL - - -  Total Bilirubin 0.0 - 1.2 mg/dL - - -  Alkaline Phos 44 - 121 IU/L - - -  AST 0 - 40 IU/L - - -  ALT 0 - 44 IU/L - - -      Microbiology: Recent Results (from the past 240 hour(s))  Body fluid culture     Status: None (Preliminary result)   Collection Time: 01/08/20  2:09 AM   Specimen: Peritoneal Cavity  Result Value Ref Range Status   Specimen Description   Final    PERITONEAL CAVITY Performed at [facility], [address], [place], [place] [address]    Special Requests   Final    NONE Performed at [facility], [address] [place], [place] [address]    Gram Stain   Final    WBC PRESENT, PREDOMINANTLY PMN NO ORGANISMS SEEN CYTOSPIN SMEAR    Culture   Final    NO GROWTH 1 DAY Performed at [facility], [place] [address], [place],  [address]    Report Status PENDING  Incomplete  Respiratory Panel by RT PCR (Flu A&B, Covid) - Nasopharyngeal Swab     Status: None   Collection Time: 01/08/20  6:46 AM   Specimen: Nasopharyngeal Swab  Result Value Ref Range Status   SARS Coronavirus 2 by RT PCR NEGATIVE NEGATIVE Final    Comment: (NOTE) SARS-CoV-2 target nucleic acids are NOT DETECTED.  The SARS-CoV-2 RNA is generally detectable in upper respiratoy specimens during the acute phase of infection. The lowest concentration of SARS-CoV-2 viral copies this assay can detect is 131 copies/mL. A negative result does not preclude SARS-Cov-2 infection and should not be used as the sole basis for treatment or other patient management decisions. A negative result may occur  with  improper specimen collection/handling, submission of specimen other than nasopharyngeal swab, presence of viral mutation(s) within the areas targeted by this assay, and inadequate number of viral copies (<131 copies/mL). A negative result must be combined with clinical observations, patient history, and epidemiological information. The expected result is Negative.  Fact Sheet for Patients:  PinkCheek.be  Fact Sheet for Healthcare Providers:  GravelBags.it  This test is no t yet approved or cleared by the [place] FDA and  has been authorized for detection and/or diagnosis of SARS-CoV-2 by FDA under an Emergency Use Authorization (EUA). This EUA will remain  in effect (meaning this test can be used) for the duration of the COVID-19 declaration under Section 564(b)(1) of the Act, 21 U.S.C. section 360bbb-3(b)(1), unless the authorization is  terminated or revoked sooner.     Influenza A by PCR NEGATIVE NEGATIVE Final   Influenza B by PCR NEGATIVE NEGATIVE Final    Comment: (NOTE) The Xpert Xpress SARS-CoV-2/FLU/RSV assay is intended as an aid in  the diagnosis of influenza from Nasopharyngeal swab specimens and  should not be used as a sole basis for treatment. Nasal washings and  aspirates are unacceptable for Xpert Xpress SARS-CoV-2/FLU/RSV  testing.  Fact Sheet for Patients: PinkCheek.be  Fact Sheet for Healthcare Providers: GravelBags.it  This test is not yet approved or cleared by the [place] FDA and  has been authorized for detection and/or diagnosis of SARS-CoV-2 by  FDA under an Emergency Use Authorization (EUA). This EUA will remain  in effect (meaning this test can be used) for the duration of the  Covid-19 declaration under Section 564(b)(1) of the Act, 21  U.S.C. section 360bbb-3(b)(1), unless the authorization is  terminated or  revoked. Performed at [facility], [address] [place], [place] [address]     IMAGING RESULTS: No current images to review  Impression/Recommendation ?64 year old male with history of bladder carcinoma since 2018 status post multiple resection, bilateral hydronephrosis and hydroureter secondary to that leading to CKD and now end-stage renal disease.  Since 12/28/2019 has been on hemodialysis. Peritoneal catheter was placed on 12/31/2019. There is no evidence of infection at the site of the catheter placement or peritonitis.  The peritoneal fluid just had 2000 WBCs predominantly eosinophils.  Not sure why this is but pathology smear review does not show any malignant cells.  Nephrology team need to keep an eye on this.  Need to be followed up as outpatient He does not need any antibiotics.  End-stage renal disease on dialysis  Bilateral hydronephrosis hydroureter  secondary to bladder cancer with obstruction  Bladder cancer urothelial carcinoma status post multiple resection  Hypertension on amlodipine and metoprolol.  Coronary artery disease status post CABG. 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  Discussed with patient, requesting provider Note:  This document was prepared using Dragon voice recognition software and may include unintentional dictation errors.

## 2020-01-09 NOTE — Progress Notes (Signed)
Mobility Specialist - Progress Note   01/09/20 1228  Mobility  Activity Ambulated in room;Ambulated in hall  Level of Assistance Independent ([place] for safety)  Assistive Device None  Distance Ambulated (ft) 340 ft  Mobility Response Tolerated well  Mobility performed by Mobility specialist  $Mobility charge 1 Mobility    Pre-mobility: 72 HR, 98% SpO2 Post-mobility: 80 HR, 99% SpO2   Pt laying in bed upon arrival. Pt agreed to session. Pt ambulated 340' total in room and hallway independently w/o AD. CGA utilized for safety. No LOB noted. No c/o pain or SOB. Overall, pt tolerated session very well. Pt pleasant and motivated t/o session. Pt left laying in bed w/ all needs placed in reach.      Anthony Hess Mobility Specialist  01/09/20, 12:31 PM

## 2020-01-09 NOTE — Discharge Summary (Signed)
Physician Discharge Summary  Anthony Hess BBC:[id number] DOB: 1955/12/21 DOA: 01/08/2020  PCP: Volney American, PA-C  Admit date: 01/08/2020 Discharge date: 01/09/2020  Admitted From: home  Disposition: home  Recommendations for Outpatient Follow-up:  1. Follow up with PCP in 1-2 weeks 2. F/u nephro in 1 week   Home Health: no  Equipment/Devices:  Discharge Condition: stable  CODE STATUS: full  Diet recommendation: Heart Healthy / Renal   Brief/Interim Summary: HPI was taken from Dr. Francine Graven: Anthony Hess is a 64 y.o. male with medical history significant for end-stage renal disease on hemodialysis (replacement therapy was started on 12/28/19 and dialysis days are T/TH/S, chronic systolic heart failure last known LVEF of 20%, coronary artery disease, hypertension and dyslipidemia who presents to the ER for evaluation of pain around the site of insertion of fluids his peritoneal dialysis catheter.  Peritoneal dialysis catheter was placed on 12/31/19.  He complains of abdominal pain which he rates a 5 x 10 in intensity at its worst with no radiation and denies having any nausea, no vomiting, no changes in his bowel habits, no fever or chills. He denies having any chest pain, no shortness of breath, no cough, no headaches, no dizziness or lightheadedness.  Excuse me He was advised to come to the emergency room by the PD nurse who had flushed his PD catheter. Labs show sodium 144, potassium 4.5, chloride 109, bicarb 21, glucose 112, BUN 68, creatinine 5.53, calcium 6.7, white count 8.9, hemoglobin 8.6, hematocrit 28.1, MCV 94.0, RDW 15.7, platelet count 242 Respiratory viral panel is negative Peritoneal fluid was cloudy and had about 2000 nucleated cells Twelve-lead EKG reviewed by me shows normal sinus rhythm   ED Course: Patient is a 64 year old Caucasian male who was recently started on renal replacement therapy during his last hospitalization through a [facility] but had a PD  catheter inserted due to patient's preference for peritoneal dialysis who presents to the ER for evaluation of abdominal pain mostly around site of insertion of the PD catheter.  PD fluid has about 2000 nucleated cells concerning for an infection.  Patient be admitted to the hospital for further evaluation.  Hospital course from Dr. Lenna Sciara. Jimmye Norman 01/09/20: Pt presented w/ abd pain initially thought to be secondary to peritonitis. Peritoneal fluid cx was NGTD. Abxs were d/c as per ID and pt was d/c home. Pt will f/u w/ nephro w/in 1 week. Pt verbalized his understanding. For more information, please see other progress/consult notes.    Discharge Diagnoses:  Principal Problem:   Acute peritonitis ([place]) Active Problems:   History of bladder cancer   Emphysema lung ([facility])   S/P CABG x 4   ESRD (end stage renal disease) ([place])   Anemia in ESRD (end-stage renal disease) ([facility])   R/o acute peritonitis: s/p recent PD catheter insertion for peritoneal dialysis. Peritoneal fluid cx NGTD. D/c IV abxs. Nephro following and recs apprec   HTN: continue on metoprolol, amlodipine   CAD: s/p CABG. Continue on aspirin, plavix, metoprolol, & statin  ESRD: on peritoneal dialysis TTS. Nephro following and recs apprec  Chronic systolic heart failure: last known LVEF is 20%. Continue renal replacement therapy for fluid management  Discharge Instructions  Discharge Instructions    Diet - low sodium heart healthy   Complete by: As directed    Renal diet as well   Discharge instructions   Complete by: As directed    F/u PCP in 1-2 weeks. F/u w/ nephro in 1 week   Discharge  wound care:   Complete by: As directed    Wound care as stated above   Increase activity slowly   Complete by: As directed      Allergies as of 01/09/2020   No Known Allergies     Medication List    TAKE these medications   amLODipine 10 MG tablet Commonly known as: NORVASC Take 1 tablet (10 mg total) by mouth daily.    aspirin EC 81 MG tablet Take 81 mg by mouth daily.   atorvastatin 40 MG tablet Commonly known as: LIPITOR Take 1 tablet (40 mg total) by mouth daily.   calcitRIOL 0.5 MCG capsule Commonly known as: ROCALTROL Take 1 capsule (0.5 mcg total) by mouth daily.   calcium acetate 667 MG capsule Commonly known as: PHOSLO Take 2 capsules (1,334 mg total) by mouth 3 (three) times daily with meals.   calcium carbonate 1250 (500 Ca) MG tablet Commonly known as: OS-CAL - dosed in mg of elemental calcium Take 1 tablet (1,250 mg total) by mouth 2 (two) times daily between meals.   clopidogrel 75 MG tablet Commonly known as: PLAVIX Take 1 tablet (75 mg total) by mouth daily.   HYDROcodone-acetaminophen 5-325 MG tablet Commonly known as: Norco Take 1-2 tablets by mouth every 6 (six) hours as needed for moderate pain.   metoprolol succinate 50 MG 24 hr tablet Commonly known as: TOPROL-XL Take 1 tablet (50 mg total) by mouth daily. Take with or immediately following a meal.   traMADol 50 MG tablet Commonly known as: ULTRAM Take 50 mg by mouth 2 (two) times daily as needed (pain.).   Vitamin D (Ergocalciferol) 1.25 MG (50000 UNIT) Caps capsule Commonly known as: DRISDOL Take 1 capsule (50,000 Units total) by mouth every 7 (seven) days.            Discharge Care Instructions  (From admission, onward)         Start     Ordered   01/09/20 0000  Discharge wound care:       Comments: Wound care as stated above   01/09/20 1251          No Known Allergies  Consultations:  ID  Nephro    Procedures/Studies: DG Chest 2 View  Result Date: 12/27/2019 CLINICAL DATA:  Hypertensive urgency EXAM: CHEST - 2 VIEW COMPARISON:  01/22/2019 FINDINGS: Increased interstitial prominence. No pleural effusion. No pneumothorax. Stable cardiomediastinal contours no acute osseous abnormality. IMPRESSION: Increased interstitial prominence, which may reflect mild edema. Electronically Signed   By:  Macy Mis M.D.   On: 12/27/2019 12:05   US Renal  Result Date: 12/27/2019 CLINICAL DATA:  Renal failure. EXAM: RENAL / URINARY TRACT ULTRASOUND COMPLETE COMPARISON:  Abdomen and pelvis CT dated 04/03/2018. Renal ultrasound dated 12/30/2018. FINDINGS: Right Kidney: Renal measurements: 9.58.2 x 5.7 cm = volume: 233 mL. Markedly diffusely echogenic with marked diffuse parenchymal thinning. Interval marked right collecting system dilatation. Left Kidney: Renal measurements: 10.7 x 5.5 x 5.5 cm = volume: 168 mL. Diffusely echogenic with poorly defined margins. Interval marked dilatation of the collecting system. Bladder: Mildly diffusely irregular walls with mild floating internal debris. Calculated prevoid volume of 411 cc and postvoid volume of 161 cc. Other: None. IMPRESSION: 1. Interval marked bilateral hydronephrosis. 2. Markedly echogenic kidneys compatible with marked chronic medical renal disease. 3. Marked diffuse right renal parenchymal atrophy and no significant left renal parenchymal atrophy. 4. Irregular bladder with a moderate-sized postvoid residual. Electronically Signed   By: Claudie Revering  M.D.   On: 12/27/2019 14:11   PERIPHERAL VASCULAR CATHETERIZATION  Result Date: 12/28/2019 See op note  ECHOCARDIOGRAM COMPLETE  Result Date: 12/28/2019    ECHOCARDIOGRAM REPORT   Patient Name:   LES LONGMORE Date of Exam: 12/28/2019 Medical Rec #:  [id number]    Height:       62.0 in Accession #:    [id number]   Weight:       137.0 lb Date of Birth:  1955-06-05   BSA:          1.628 m Patient Age:    46 years     BP:           157/109 mmHg Patient Gender: M            HR:           69 bpm. Exam Location:  [facility] Procedure: 2D Echo, Color Doppler and Cardiac Doppler Indications:     R06.00 Dyspnea  History:         Patient has prior history of Echocardiogram examinations.                  Previous Myocardial Infarction and CAD, Prior CABG, CKD; Risk                  Factors:Hypertension.  Sonographer:      Charmayne Sheer RDCS (AE) Referring Phys:  [id number] AMY N COX Diagnosing Phys: Isaias Cowman MD IMPRESSIONS  1. Left ventricular ejection fraction, by estimation, is 20 to 25%. The left ventricle has severely decreased function. The left ventricle has no regional wall motion abnormalities. The left ventricular internal cavity size was moderately dilated. Left ventricular diastolic parameters are consistent with Grade I diastolic dysfunction (impaired relaxation).  2. Right ventricular systolic function is normal. The right ventricular size is normal.  3. The mitral valve is normal in structure. Moderate mitral valve regurgitation. No evidence of mitral stenosis.  4. The aortic valve is normal in structure. Aortic valve regurgitation is mild to moderate. No aortic stenosis is present.  5. The inferior vena cava is normal in size with greater than 50% respiratory variability, suggesting right atrial pressure of 3 mmHg. FINDINGS  Left Ventricle: Left ventricular ejection fraction, by estimation, is 20 to 25%. The left ventricle has severely decreased function. The left ventricle has no regional wall motion abnormalities. The left ventricular internal cavity size was moderately dilated. There is no left ventricular hypertrophy. Left ventricular diastolic parameters are consistent with Grade I diastolic dysfunction (impaired relaxation). Right Ventricle: The right ventricular size is normal. No increase in right ventricular wall thickness. Right ventricular systolic function is normal. Left Atrium: Left atrial size was normal in size. Right Atrium: Right atrial size was normal in size. Pericardium: There is no evidence of pericardial effusion. Mitral Valve: The mitral valve is normal in structure. Moderate mitral valve regurgitation. No evidence of mitral valve stenosis. MV peak gradient, 4.6 mmHg. The mean mitral valve gradient is 2.0 mmHg. Tricuspid Valve: The tricuspid valve is normal in structure. Tricuspid valve  regurgitation is mild . No evidence of tricuspid stenosis. Aortic Valve: The aortic valve is normal in structure. Aortic valve regurgitation is mild to moderate. No aortic stenosis is present. Aortic valve mean gradient measures 4.0 mmHg. Aortic valve peak gradient measures 8.0 mmHg. Aortic valve area, by VTI measures 2.31 cm. Pulmonic Valve: The pulmonic valve was normal in structure. Pulmonic valve regurgitation is not visualized. No evidence of pulmonic stenosis. Aorta: The aortic  root is normal in size and structure. Venous: The inferior vena cava is normal in size with greater than 50% respiratory variability, suggesting right atrial pressure of 3 mmHg. IAS/Shunts: No atrial level shunt detected by color flow Doppler.  LEFT VENTRICLE PLAX 2D LVIDd:         4.52 cm  Diastology LVIDs:         4.17 cm  LV e' medial:    2.94 cm/s LV PW:         1.55 cm  LV E/e' medial:  21.8 LV IVS:        1.04 cm  LV e' lateral:   4.79 cm/s LVOT diam:     1.90 cm  LV E/e' lateral: 13.4 LV SV:         54 LV SV Index:   33 LVOT Area:     2.84 cm  RIGHT VENTRICLE RV Basal diam:  3.11 cm LEFT ATRIUM             Index       RIGHT ATRIUM           Index LA diam:        3.70 cm 2.27 cm/m  RA Area:     10.30 cm LA Vol (A2C):   36.7 ml 22.55 ml/m RA Volume:   23.20 ml  14.25 ml/m LA Vol (A4C):   36.5 ml 22.42 ml/m LA Biplane Vol: 37.0 ml 22.73 ml/m  AORTIC VALVE                   PULMONIC VALVE AV Area (Vmax):    2.05 cm    PV Vmax:       0.98 m/s AV Area (Vmean):   2.27 cm    PV Vmean:      61.000 cm/s AV Area (VTI):     2.31 cm    PV VTI:        0.160 m AV Vmax:           141.00 cm/s PV Peak grad:  3.8 mmHg AV Vmean:          88.300 cm/s PV Mean grad:  2.0 mmHg AV VTI:            0.232 m AV Peak Grad:      8.0 mmHg AV Mean Grad:      4.0 mmHg LVOT Vmax:         102.00 cm/s LVOT Vmean:        70.800 cm/s LVOT VTI:          0.189 m LVOT/AV VTI ratio: 0.81  AORTA Ao Root diam: 3.60 cm MITRAL VALVE                TRICUSPID VALVE  MV Area (PHT): 4.06 cm     TR Peak grad:   22.8 mmHg MV Peak grad:  4.6 mmHg     TR Vmax:        239.00 cm/s MV Mean grad:  2.0 mmHg MV Vmax:       1.07 m/s     SHUNTS MV Vmean:      65.7 cm/s    Systemic VTI:  0.19 m MV Decel Time: 187 msec     Systemic Diam: 1.90 cm MV E velocity: 64.00 cm/s MV A velocity: 104.00 cm/s MV E/A ratio:  0.62 Isaias Cowman MD Electronically signed by Isaias Cowman MD Signature Date/Time: 12/28/2019/1:46:03 PM    Final  Subjective: Pt c/o fatigue    Discharge Exam: Vitals:   01/09/20 0754 01/09/20 1148  BP: (!) 149/105 (!) 145/97  Pulse: 67 66  Resp: 18 16  Temp: 98 F (36.7 C) 98.2 F (36.8 C)  SpO2: 97% 98%   Vitals:   01/08/20 2314 01/09/20 0448 01/09/20 0754 01/09/20 1148  BP: (!) 145/100 (!) 134/97 (!) 149/105 (!) 145/97  Pulse: 73 70 67 66  Resp: 16  18 16   Temp: 98.4 F (36.9 C) 99.3 F (37.4 C) 98 F (36.7 C) 98.2 F (36.8 C)  TempSrc: Oral  Oral   SpO2: 96% 95% 97% 98%  Weight:      Height:        General: Pt is alert, awake, not in acute distress Cardiovascular: S1/S2 +, no rubs, no gallops Respiratory: CTA bilaterally, no wheezing, no rhonchi Abdominal: Soft, NT, ND, bowel sounds + Extremities:  no cyanosis    The results of significant diagnostics from this hospitalization (including imaging, microbiology, ancillary and laboratory) are listed below for reference.     Microbiology: Recent Results (from the past 240 hour(s))  Body fluid culture     Status: None (Preliminary result)   Collection Time: 01/08/20  2:09 AM   Specimen: Peritoneal Cavity  Result Value Ref Range Status   Specimen Description   Final    PERITONEAL CAVITY Performed at [facility], [address] [place], [place] [address]    Special Requests   Final    NONE Performed at [facility], [address] [place], [place] [address]    Gram Stain   Final    WBC PRESENT, PREDOMINANTLY PMN NO ORGANISMS  SEEN CYTOSPIN SMEAR    Culture   Final    NO GROWTH 1 DAY Performed at [facility], [place] [address] [place], [place] [address]    Report Status PENDING  Incomplete  Respiratory Panel by RT PCR (Flu A&B, Covid) - Nasopharyngeal Swab     Status: None   Collection Time: 01/08/20  6:46 AM   Specimen: Nasopharyngeal Swab  Result Value Ref Range Status   SARS Coronavirus 2 by RT PCR NEGATIVE NEGATIVE Final    Comment: (NOTE) SARS-CoV-2 target nucleic acids are NOT DETECTED.  The SARS-CoV-2 RNA is generally detectable in upper respiratoy specimens during the acute phase of infection. The lowest concentration of SARS-CoV-2 viral copies this assay can detect is 131 copies/mL. A negative result does not preclude SARS-Cov-2 infection and should not be used as the sole basis for treatment or other patient management decisions. A negative result may occur with  improper specimen collection/handling, submission of specimen other than nasopharyngeal swab, presence of viral mutation(s) within the areas targeted by this assay, and inadequate number of viral copies (<131 copies/mL). A negative result must be combined with clinical observations, patient history, and epidemiological information. The expected result is Negative.  Fact Sheet for Patients:  PinkCheek.be  Fact Sheet for Healthcare Providers:  GravelBags.it  This test is no t yet approved or cleared by the [place] FDA and  has been authorized for detection and/or diagnosis of SARS-CoV-2 by FDA under an Emergency Use Authorization (EUA). This EUA will remain  in effect (meaning this test can be used) for the duration of the COVID-19 declaration under Section 564(b)(1) of the Act, 21 U.S.C. section 360bbb-3(b)(1), unless the authorization is terminated or revoked sooner.     Influenza A by PCR NEGATIVE NEGATIVE Final   Influenza B by  PCR NEGATIVE NEGATIVE Final     Comment: (NOTE) The Xpert Xpress SARS-CoV-2/FLU/RSV assay is intended as an aid in  the diagnosis of influenza from Nasopharyngeal swab specimens and  should not be used as a sole basis for treatment. Nasal washings and  aspirates are unacceptable for Xpert Xpress SARS-CoV-2/FLU/RSV  testing.  Fact Sheet for Patients: PinkCheek.be  Fact Sheet for Healthcare Providers: GravelBags.it  This test is not yet approved or cleared by the [place] FDA and  has been authorized for detection and/or diagnosis of SARS-CoV-2 by  FDA under an Emergency Use Authorization (EUA). This EUA will remain  in effect (meaning this test can be used) for the duration of the  Covid-19 declaration under Section 564(b)(1) of the Act, 21  U.S.C. section 360bbb-3(b)(1), unless the authorization is  terminated or revoked. Performed at [facility], [address] [place], [place] [address]      Labs: BNP (last 3 results) Recent Labs    12/27/19 1108  BNP 2,130.8*   Basic Metabolic Panel: Recent Labs  Lab 01/04/20 1542 01/07/20 2257 01/08/20 0928 01/09/20 0613  NA 143 144 144 141  K 4.2 4.5 4.5 4.5  CL 105 109 109 102  CO2 22 21* 22 26  GLUCOSE 122* 112* 87 96  BUN 52* 68* 69* 43*  CREATININE 4.59* 5.53* 5.62* 4.19*  CALCIUM 7.1* 6.7* 6.5* 7.4*  PHOS  --   --  7.0*  --    Liver Function Tests: Recent Labs  Lab 01/04/20 1542 01/08/20 0928  AST 30  --   ALT 8  --   ALKPHOS 67  --   BILITOT <0.2  --   PROT 5.7*  --   ALBUMIN 3.5* 3.0*   No results for input(s): LIPASE, AMYLASE in the last 168 hours. No results for input(s): AMMONIA in the last 168 hours. CBC: Recent Labs  Lab 01/04/20 1540 01/07/20 2257 01/08/20 0928 01/09/20 0613  WBC 6.8 8.9 8.1 8.7  NEUTROABS 5.0 5.4  --   --   HGB 9.1* 8.6* 9.0* 10.1*  HCT 29.6* 28.1* 28.9* 32.0*  MCV 94 94.0 92.6 91.7  PLT 191 242 233 246   Cardiac Enzymes: No  results for input(s): CKTOTAL, CKMB, CKMBINDEX, TROPONINI in the last 168 hours. BNP: Invalid input(s): POCBNP CBG: No results for input(s): GLUCAP in the last 168 hours. D-Dimer No results for input(s): DDIMER in the last 72 hours. Hgb A1c No results for input(s): HGBA1C in the last 72 hours. Lipid Profile No results for input(s): CHOL, HDL, LDLCALC, TRIG, CHOLHDL, LDLDIRECT in the last 72 hours. Thyroid function studies No results for input(s): TSH, T4TOTAL, T3FREE, THYROIDAB in the last 72 hours.  Invalid input(s): FREET3 Anemia work up No results for input(s): VITAMINB12, FOLATE, FERRITIN, TIBC, IRON, RETICCTPCT in the last 72 hours. Urinalysis    Component Value Date/Time   COLORURINE STRAW (A) 12/27/2019 1756   APPEARANCEUR CLEAR (A) 12/27/2019 1756   APPEARANCEUR Cloudy (A) 05/08/2019 1448   LABSPEC 1.010 12/27/2019 1756   PHURINE 5.0 12/27/2019 1756   GLUCOSEU NEGATIVE 12/27/2019 1756   HGBUR NEGATIVE 12/27/2019 1756   BILIRUBINUR NEGATIVE 12/27/2019 1756   BILIRUBINUR Negative 05/08/2019 1448   KETONESUR NEGATIVE 12/27/2019 1756   PROTEINUR >=300 (A) 12/27/2019 1756   NITRITE NEGATIVE 12/27/2019 1756   LEUKOCYTESUR NEGATIVE 12/27/2019 1756   Sepsis Labs Invalid input(s): PROCALCITONIN,  WBC,  LACTICIDVEN Microbiology Recent Results (from the past 240 hour(s))  Body fluid culture  Status: None (Preliminary result)   Collection Time: 01/08/20  2:09 AM   Specimen: Peritoneal Cavity  Result Value Ref Range Status   Specimen Description   Final    PERITONEAL CAVITY Performed at [facility], [address] [place], [place] [address]    Special Requests   Final    NONE Performed at [facility], [address] [place], [place] [address]    Gram Stain   Final    WBC PRESENT, PREDOMINANTLY PMN NO ORGANISMS SEEN CYTOSPIN SMEAR    Culture   Final    NO GROWTH 1 DAY Performed at [facility], [place] [address] [place], [place]  [address]    Report Status PENDING  Incomplete  Respiratory Panel by RT PCR (Flu A&B, Covid) - Nasopharyngeal Swab     Status: None   Collection Time: 01/08/20  6:46 AM   Specimen: Nasopharyngeal Swab  Result Value Ref Range Status   SARS Coronavirus 2 by RT PCR NEGATIVE NEGATIVE Final    Comment: (NOTE) SARS-CoV-2 target nucleic acids are NOT DETECTED.  The SARS-CoV-2 RNA is generally detectable in upper respiratoy specimens during the acute phase of infection. The lowest concentration of SARS-CoV-2 viral copies this assay can detect is 131 copies/mL. A negative result does not preclude SARS-Cov-2 infection and should not be used as the sole basis for treatment or other patient management decisions. A negative result may occur with  improper specimen collection/handling, submission of specimen other than nasopharyngeal swab, presence of viral mutation(s) within the areas targeted by this assay, and inadequate number of viral copies (<131 copies/mL). A negative result must be combined with clinical observations, patient history, and epidemiological information. The expected result is Negative.  Fact Sheet for Patients:  PinkCheek.be  Fact Sheet for Healthcare Providers:  GravelBags.it  This test is no t yet approved or cleared by the [place] FDA and  has been authorized for detection and/or diagnosis of SARS-CoV-2 by FDA under an Emergency Use Authorization (EUA). This EUA will remain  in effect (meaning this test can be used) for the duration of the COVID-19 declaration under Section 564(b)(1) of the Act, 21 U.S.C. section 360bbb-3(b)(1), unless the authorization is terminated or revoked sooner.     Influenza A by PCR NEGATIVE NEGATIVE Final   Influenza B by PCR NEGATIVE NEGATIVE Final    Comment: (NOTE) The Xpert Xpress SARS-CoV-2/FLU/RSV assay is intended as an aid in  the diagnosis of influenza from  Nasopharyngeal swab specimens and  should not be used as a sole basis for treatment. Nasal washings and  aspirates are unacceptable for Xpert Xpress SARS-CoV-2/FLU/RSV  testing.  Fact Sheet for Patients: PinkCheek.be  Fact Sheet for Healthcare Providers: GravelBags.it  This test is not yet approved or cleared by the [place] FDA and  has been authorized for detection and/or diagnosis of SARS-CoV-2 by  FDA under an Emergency Use Authorization (EUA). This EUA will remain  in effect (meaning this test can be used) for the duration of the  Covid-19 declaration under Section 564(b)(1) of the Act, 21  U.S.C. section 360bbb-3(b)(1), unless the authorization is  terminated or revoked. Performed at [facility], [address] [place], [place] [address]      Time coordinating discharge: Over 30 minutes  SIGNED:   Wyvonnia Dusky, MD  [facility] 01/09/2020, 1:49 PM Pager   If 7PM-7AM, please contact night-coverage [email or web address]

## 2020-01-09 NOTE — [place] Notes (Signed)
[facility]  ROUNDING NOTE   Subjective:   [name] was admitted to [facility] on 01/08/2020 for Acute peritonitis ([place]) [K65.0] Peritoneal dialysis-associated peritonitis, initial encounter [facility]) [T85.71XA]  Patient received hemodialysis treatment yesterday, tolerated treatment well. He denies nausea,vomiting, abdominal pain and SOB. Peritoneal catheter site still 'sore'.   Objective:  Vital signs in last 24 hours:  Temp:  [97.5 F (36.4 C)-99.3 F (37.4 C)] 98 F (36.7 C) (11/17 0754) Pulse Rate:  [61-76] 67 (11/17 0754) Resp:  [13-19] 18 (11/17 0754) BP: (115-150)/(91-106) 149/105 (11/17 0754) SpO2:  [94 %-99 %] 97 % (11/17 0754)  Weight change:  Filed Weights   01/08/20 0628  Weight: 59.4 kg    Intake/Output: I/O last 3 completed shifts: In: 100 [IV Piggyback:100] Out: 1610 [Urine:1100; Other:1500]   Intake/Output this shift:  No intake/output data recorded.  Physical Exam: General: In no acute distress  Head: Oral mucous membranes moist  Eyes: Sclerae and conjunctivae clear  Lungs:  Lungs clear to auscultation, respiration symmetrical,unlabored   Heart: S1S2, no rubs or gallops   Abdomen:  Soft, PD catheter site with mild erythema and tenderness  Extremities: No peripheral edema  Neurologic: Awake, alert,oriented  Skin: No acute lesions or rashes   Access Rt IJ PC / Dew/ 12/28/2019,PD catheter    Basic Metabolic Panel: Recent Labs  Lab 01/04/20 1542 01/04/20 1542 01/07/20 2257 01/08/20 0928 01/09/20 0613  NA 143  --  144 144 141  K 4.2  --  4.5 4.5 4.5  CL 105  --  109 109 102  CO2 22  --  21* 22 26  GLUCOSE 122*  --  112* 87 96  BUN 52*  --  68* 69* 43*  CREATININE 4.59*  --  5.53* 5.62* 4.19*  CALCIUM 7.1*   < > 6.7* 6.5* 7.4*  PHOS  --   --   --  7.0*  --    < > = values in this interval not displayed.    Liver Function Tests: Recent Labs  Lab 01/04/20 1542 01/08/20 0928  AST 30  --   ALT 8  --   ALKPHOS 67  --    BILITOT <0.2  --   PROT 5.7*  --   ALBUMIN 3.5* 3.0*   No results for input(s): LIPASE, AMYLASE in the last 168 hours. No results for input(s): AMMONIA in the last 168 hours.  CBC: Recent Labs  Lab 01/04/20 1540 01/07/20 2257 01/08/20 0928 01/09/20 0613  WBC 6.8 8.9 8.1 8.7  NEUTROABS 5.0 5.4  --   --   HGB 9.1* 8.6* 9.0* 10.1*  HCT 29.6* 28.1* 28.9* 32.0*  MCV 94 94.0 92.6 91.7  PLT 191 242 233 246    Cardiac Enzymes: No results for input(s): CKTOTAL, CKMB, CKMBINDEX, TROPONINI in the last 168 hours.  BNP: Invalid input(s): POCBNP  CBG: No results for input(s): GLUCAP in the last 168 hours.  Microbiology: Results for orders placed or performed during the hospital encounter of 01/08/20  Respiratory Panel by RT PCR (Flu A&B, Covid) - Nasopharyngeal Swab     Status: None   Collection Time: 01/08/20  6:46 AM   Specimen: Nasopharyngeal Swab  Result Value Ref Range Status   SARS Coronavirus 2 by RT PCR NEGATIVE NEGATIVE Final    Comment: (NOTE) SARS-CoV-2 target nucleic acids are NOT DETECTED.  The SARS-CoV-2 RNA is generally detectable in upper respiratoy specimens during the acute phase of infection. The lowest concentration of SARS-CoV-2 viral  copies this assay can detect is 131 copies/mL. A negative result does not preclude SARS-Cov-2 infection and should not be used as the sole basis for treatment or other patient management decisions. A negative result may occur with  improper specimen collection/handling, submission of specimen other than nasopharyngeal swab, presence of viral mutation(s) within the areas targeted by this assay, and inadequate number of viral copies (<131 copies/mL). A negative result must be combined with clinical observations, patient history, and epidemiological information. The expected result is Negative.  Fact Sheet for Patients:  PinkCheek.be  Fact Sheet for Healthcare Providers:   GravelBags.it  This test is no t yet approved or cleared by the [place] FDA and  has been authorized for detection and/or diagnosis of SARS-CoV-2 by FDA under an Emergency Use Authorization (EUA). This EUA will remain  in effect (meaning this test can be used) for the duration of the COVID-19 declaration under Section 564(b)(1) of the Act, 21 U.S.C. section 360bbb-3(b)(1), unless the authorization is terminated or revoked sooner.     Influenza A by PCR NEGATIVE NEGATIVE Final   Influenza B by PCR NEGATIVE NEGATIVE Final    Comment: (NOTE) The Xpert Xpress SARS-CoV-2/FLU/RSV assay is intended as an aid in  the diagnosis of influenza from Nasopharyngeal swab specimens and  should not be used as a sole basis for treatment. Nasal washings and  aspirates are unacceptable for Xpert Xpress SARS-CoV-2/FLU/RSV  testing.  Fact Sheet for Patients: PinkCheek.be  Fact Sheet for Healthcare Providers: GravelBags.it  This test is not yet approved or cleared by the [place] FDA and  has been authorized for detection and/or diagnosis of SARS-CoV-2 by  FDA under an Emergency Use Authorization (EUA). This EUA will remain  in effect (meaning this test can be used) for the duration of the  Covid-19 declaration under Section 564(b)(1) of the Act, 21  U.S.C. section 360bbb-3(b)(1), unless the authorization is  terminated or revoked. Performed at [facility], [address] [place], [place] [address]     Coagulation Studies: No results for input(s): LABPROT, INR in the last 72 hours.  Urinalysis: No results for input(s): COLORURINE, LABSPEC, PHURINE, GLUCOSEU, HGBUR, BILIRUBINUR, KETONESUR, PROTEINUR, UROBILINOGEN, NITRITE, LEUKOCYTESUR in the last 72 hours.  Invalid input(s): APPERANCEUR    Imaging: No results found.   Medications:   . sodium chloride    . cefTAZidime (FORTAZ)   IV    . vancomycin     . amLODipine  10 mg Oral Daily  . aspirin EC  81 mg Oral Daily  . atorvastatin  40 mg Oral Daily  . calcitRIOL  0.5 mcg Oral Daily  . calcium acetate  1,334 mg Oral TID WC  . calcium carbonate  1,250 mg Oral BID BM  . Chlorhexidine Gluconate Cloth  6 each Topical Q0600  . clopidogrel  75 mg Oral Daily  . epoetin (EPOGEN/PROCRIT) injection  4,000 Units Intravenous Q T,Th,Sa-HD  . heparin  5,000 Units Subcutaneous Q8H  . metoprolol succinate  50 mg Oral Daily  . sodium chloride flush  3 mL Intravenous Q12H  . Vitamin D (Ergocalciferol)  50,000 Units Oral Q7 days     Assessment/ Plan:  [name] is a 64 y.o. white male with hypertension, coronary artery disease, nephrolithiasis, history of bladder cancer who is admitted to [facility] on 01/08/2020 for Acute peritonitis ([place]) [K65.0] Peritoneal dialysis-associated peritonitis, initial encounter ([place]) [T85.71XA]  # ESRD  baseline creatinine of 3.26, GFR of 19 on 06/03/2019.  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   Received dialysis treatment yesterday, tolerated well No additional dialysis required Will continue TTS schedule as outpatient if getting discharged today    #Acute Peritonitis Patient coming with c/o erythema and tenderness on peritoneal catheter insertion site  Peritoneal fluid culture with no growth in one day Patient is on Ceftazidime and Vancomycin   #Anemia with kidney disease Lab Results  Component Value Date   HGB 10.1 (L) 01/09/2020   At goal  #Hypocalcemia in setting of secondary hyperparathyroidism Lab Results  Component Value Date   PTH 159 (H) 12/27/2019   CALCIUM 7.4 (L) 01/09/2020   CAION 1.07 (L) 05/15/2019   PHOS 7.0 (H) 01/08/2020  Continue Calcitriol, Calcium carbonate and calcium acetate     LOS: 1 Dashanti Burr 11/17/202111:19 AM

## 2020-01-09 NOTE — TOC Initial Note (Addendum)
Transition of Care [facility]) - Initial/Assessment Note    Patient Details  Name: Anthony Hess MRN: [id number] Date of Birth: [birth year]  [facility]) CM/SW Contact:    Beverly Sessions, RN Phone Number: 01/09/2020, 10:48 AM  Clinical Narrative:                 Patient admitted for peritonitis Patient states that he live at home with wife Currently received outpatient HD and drives himself to appointments. Patient states that he already has PD cath in place, wife is going to be educated and plan for initiated of PD at home in the future.   PCP [facility] - denies issues obtaining medications  Patient states that the plan if for him to "get antibiotics at dialysis, but they already set that up"  TOC to confirm   Update:  Per MD patient will discharge home today.  Does not require antibiotics at HD  Elvera Bicker dialysis liaison notified of discharge   Expected Discharge Plan: Home/Self Care Barriers to Discharge: Continued Medical Work up   Patient Goals and CMS Choice        Expected Discharge Plan and Services Expected Discharge Plan: Home/Self Care   Discharge Planning Services: CM Consult   Living arrangements for the past 2 months: Single Family Home                                      Prior Living Arrangements/Services Living arrangements for the past 2 months: Single Family Home Lives with:: Spouse   Do you feel safe going back to the place where you live?: Yes      Need for Family Participation in Patient Care: No (Comment) Care giver support system in place?: Yes (comment)   Criminal Activity/Legal Involvement Pertinent to Current Situation/Hospitalization: No - Comment as needed  Activities of Daily Living Home Assistive Devices/Equipment: Blood pressure cuff, Eyeglasses ADL Screening (condition at time of admission) Patient's cognitive ability adequate to safely complete daily activities?: Yes Is the  patient deaf or have difficulty hearing?: No Does the patient have difficulty seeing, even when wearing glasses/contacts?: No Does the patient have difficulty concentrating, remembering, or making decisions?: No Patient able to express need for assistance with ADLs?: Yes Does the patient have difficulty dressing or bathing?: No Independently performs ADLs?: Yes (appropriate for developmental age) Does the patient have difficulty walking or climbing stairs?: No Weakness of Legs: None Weakness of Arms/Hands: None  Permission Sought/Granted                  Emotional Assessment Appearance:: Appears stated age     Orientation: : Oriented to Self, Oriented to Place, Oriented to  Time, Oriented to Situation   Psych Involvement: No (comment)  Admission diagnosis:  Acute peritonitis ([place]) [K65.0] Peritoneal dialysis-associated peritonitis, initial encounter [facility]) [T85.71XA] Patient Active Problem List   Diagnosis Date Noted  . Acute peritonitis ([place]) 01/08/2020  . Hypotension 01/04/2020  . Anemia in ESRD (end-stage renal disease) ([place]) 01/04/2020  . Hyperlipidemia 01/04/2020  . Mass of left side of neck 01/04/2020  . Senile purpura ([place]) 01/04/2020  . Acute on chronic systolic CHF (congestive heart failure) ([place])   . Hydroureteronephrosis   . Acute renal failure superimposed on stage 5 chronic kidney disease, not on chronic dialysis ([place]) 12/27/2019  . Atrial fibrillation ([place]) 01/05/2019  . ESRD (end stage renal disease) (  [place]) 01/05/2019  . 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 Emphysema lung ([place]) 04/14/2018  . Bilateral hydronephrosis 04/03/2018  . Hypertension 03/27/2018  . Cigarette smoker 03/20/2018  . History of bladder cancer 03/20/2018   PCP:  Volney American, PA-C Pharmacy:   [facility] [place] [address] [place] [address] Phone: [phone] Fax: [phone]     Social Determinants  of Health ([place]) Interventions    Readmission Risk Interventions Readmission Risk Prevention Plan 01/09/2020  Transportation Screening Complete  Social Work Consult for [place] Planning/Counseling [place] Not Applicable  Medication Review Press photographer) Complete

## 2020-01-11 ENCOUNTER — Ambulatory Visit (INDEPENDENT_AMBULATORY_CARE_PROVIDER_SITE_OTHER): Payer: Medicaid Other | Admitting: Nurse Practitioner

## 2020-01-11 ENCOUNTER — Other Ambulatory Visit: Payer: Self-pay

## 2020-01-11 ENCOUNTER — Encounter: Payer: Self-pay | Admitting: Nurse Practitioner

## 2020-01-11 VITALS — BP 113/71 | HR 70 | Temp 98.5°F | Wt 134.4 lb

## 2020-01-11 DIAGNOSIS — I10 Essential (primary) hypertension: Secondary | ICD-10-CM | POA: Diagnosis not present

## 2020-01-11 DIAGNOSIS — I952 Hypotension due to drugs: Secondary | ICD-10-CM | POA: Diagnosis not present

## 2020-01-11 DIAGNOSIS — I5022 Chronic systolic (congestive) heart failure: Secondary | ICD-10-CM

## 2020-01-11 DIAGNOSIS — N186 End stage renal disease: Secondary | ICD-10-CM | POA: Diagnosis not present

## 2020-01-11 LAB — BODY FLUID CULTURE: Culture: NO GROWTH

## 2020-01-11 NOTE — Assessment & Plan Note (Signed)
Chronic, stable.  Continue Amlodipine and Metoprolol at this time, plan to further reduce as needed based on BP readings.  Recommend he monitor BP at home daily and document for provider + focus on DASH diet.  Labs next visit.  Return in 3 months.

## 2020-01-11 NOTE — Patient Instructions (Signed)
Chronic Kidney Disease, Adult Chronic kidney disease (CKD) happens when the kidneys are damaged over a long period of time. The kidneys are two organs that help with:  Getting rid of waste and extra fluid from the blood.  Making hormones that maintain the amount of fluid in your tissues and blood vessels.  Making sure that the body has the right amount of fluids and chemicals. Most of the time, CKD does not go away, but it can usually be controlled. Steps must be taken to slow down the kidney damage or to stop it from getting worse. If this is not done, the kidneys may stop working. Follow these instructions at home: Medicines  Take over-the-counter and prescription medicines only as told by your doctor. You may need to change the amount of medicines you take.  Do not take any new medicines unless your doctor says it is okay. Many medicines can make your kidney damage worse.  Do not take any vitamin and supplements unless your doctor says it is okay. Many vitamins and supplements can make your kidney damage worse. General instructions  Follow a diet as told by your doctor. You may need to stay away from: ? Alcohol. ? Salty foods. ? Foods that are high in:  Potassium.  Calcium.  Protein.  Do not use any products that contain nicotine or tobacco, such as cigarettes and e-cigarettes. If you need help quitting, ask your doctor.  Keep track of your blood pressure at home. Tell your doctor about any changes.  If you have diabetes, keep track of your blood sugar as told by your doctor.  Try to stay at a healthy weight. If you need help, ask your doctor.  Exercise at least 30 minutes a day, 5 days a week.  Stay up-to-date with your shots (immunizations) as told by your doctor.  Keep all follow-up visits as told by your doctor. This is important. Contact a doctor if:  Your symptoms get worse.  You have new symptoms. Get help right away if:  You have symptoms of end-stage  kidney disease. These may include: ? Headaches. ? Numbness in your hands or feet. ? Easy bruising. ? Having hiccups often. ? Chest pain. ? Shortness of breath. ? Stopping of menstrual periods in women.  You have a fever.  You have very little pee (urine).  You have pain or bleeding when you pee. Summary  Chronic kidney disease (CKD) happens when the kidneys are damaged over a long period of time.  Most of the time, this condition does not go away, but it can usually be controlled. Steps must be taken to slow down the kidney damage or to stop it from getting worse.  Treatment may include a combination of medicines and lifestyle changes. This information is not intended to replace advice given to you by your health care provider. Make sure you discuss any questions you have with your health care provider. Document Revised: 01/21/2017 Document Reviewed: 03/15/2016 Elsevier Patient Education  2020 Elsevier Inc.  

## 2020-01-11 NOTE — Assessment & Plan Note (Signed)
Ongoing.  Currently doing dialysis Tuesday, Thursday, Saturday.  Continue current medication regimen and collaboration with nephrology.

## 2020-01-11 NOTE — [place] Notes (Signed)
BP 113/71   Pulse 70   Temp 98.5 F (36.9 C)   Wt 134 lb 6.4 oz (61 kg)   SpO2 96%   BMI 23.07 kg/m    Subjective:    Patient ID: Anthony Hess, male    DOB: August 16, 1955, 64 y.o.   MRN: [id number]  HPI: Anthony Hess is a 64 y.o. male  Chief Complaint  Patient presents with  . Hypertension   HYPERTENSION WITH HF AND ESRD Follow-up today for low blood pressure readings last visit with recent initiation of hemodialysis in presence of new diagnoses HF and ESRD.  Was hospitalized recently for PD catheter issues -- discharge labs noted CRT 4.19, GFR 15, CA+ 7.4, BUN 43, H/H 10.1/32.0.  Last visit discontinued Hydralazine due to low BP readings.  Currently taking Amlodipine and Metoprolol + ASA and Plavix.  Goes to dialysis on Tuesday, Thursday, Saturday. Sees nephrology next on 24th he reports.   Hypertension status: stable  Satisfied with current treatment? yes Duration of hypertension: chronic BP monitoring frequency:  daily BP range: 116-20/70 range at home BP medication side effects:  no Medication compliance: good compliance Previous BP meds: Hydralazine, Amlodipine, Metoprolol Aspirin: yes Recurrent headaches: no Visual changes: no Palpitations: no Dyspnea: no Chest pain: no Lower extremity edema: no Dizzy/lightheaded: no  Relevant past medical, surgical, family and social history reviewed and updated as indicated. Interim medical history since our last visit reviewed. Allergies and medications reviewed and updated.  Review of Systems  Constitutional: Negative for activity change, diaphoresis, fatigue and fever.  Respiratory: Negative for cough, chest tightness, shortness of breath and wheezing.   Cardiovascular: Negative for chest pain, palpitations and leg swelling.  Gastrointestinal: Negative.   Endocrine: Negative.   Neurological: Negative.   Psychiatric/Behavioral: Negative.     Per HPI unless specifically indicated above     Objective:    BP 113/71    Pulse 70   Temp 98.5 F (36.9 C)   Wt 134 lb 6.4 oz (61 kg)   SpO2 96%   BMI 23.07 kg/m   Wt Readings from Last 3 Encounters:  01/11/20 134 lb 6.4 oz (61 kg)  01/08/20 131 lb (59.4 kg)  01/07/20 131 lb (59.4 kg)    Physical Exam Vitals and nursing note reviewed.  Constitutional:      General: He is awake. He is not in acute distress.    Appearance: He is well-developed and well-groomed. He is not ill-appearing.  HENT:     Head: Normocephalic and atraumatic.     Right Ear: Hearing normal. No drainage.     Left Ear: Hearing normal. No drainage.  Eyes:     General: Lids are normal.        Right eye: No discharge.        Left eye: No discharge.     Conjunctiva/sclera: Conjunctivae normal.     Pupils: Pupils are equal, round, and reactive to light.  Neck:     Thyroid: No thyromegaly.     Vascular: No carotid bruit.     Trachea: Trachea normal.   Cardiovascular:     Rate and Rhythm: Normal rate and regular rhythm.     Heart sounds: Normal heart sounds, S1 normal and S2 normal. No murmur heard.  No gallop.   Pulmonary:     Effort: Pulmonary effort is normal. No accessory muscle usage or respiratory distress.     Breath sounds: Normal breath sounds.  Chest:     Comments: HD catheter intact  with dressing present to right upper chest. Abdominal:     General: Bowel sounds are normal.     Palpations: Abdomen is soft.     Comments: PD catheter present with intact dressing over top to left quadrant.  Musculoskeletal:        General: Normal range of motion.     Cervical back: Normal range of motion and neck supple.     Right lower leg: No edema.     Left lower leg: No edema.  Skin:    General: Skin is warm and dry.     Capillary Refill: Capillary refill takes less than 2 seconds.     Findings: No rash.     Comments: Scattered pale purple bruises to bilateral upper extremities.  Neurological:     Mental Status: He is alert and oriented to person, place, and time.     Deep  Tendon Reflexes: Reflexes are normal and symmetric.  Psychiatric:        Attention and Perception: Attention normal.        Mood and Affect: Mood normal.        Speech: Speech normal.        Behavior: Behavior normal. Behavior is cooperative.        Thought Content: Thought content normal.     Results for orders placed or performed during the hospital encounter of 01/08/20  Respiratory Panel by RT PCR (Flu A&B, Covid) - Nasopharyngeal Swab   Specimen: Nasopharyngeal Swab  Result Value Ref Range   SARS Coronavirus 2 by RT PCR NEGATIVE NEGATIVE   Influenza A by PCR NEGATIVE NEGATIVE   Influenza B by PCR NEGATIVE NEGATIVE  Renal function panel  Result Value Ref Range   Sodium 144 135 - 145 mmol/L   Potassium 4.5 3.5 - 5.1 mmol/L   Chloride 109 98 - 111 mmol/L   CO2 22 22 - 32 mmol/L   Glucose, Bld 87 70 - 99 mg/dL   BUN 69 (H) 8 - 23 mg/dL   Creatinine, Ser 5.62 (H) 0.61 - 1.24 mg/dL   Calcium 6.5 (L) 8.9 - 10.3 mg/dL   Phosphorus 7.0 (H) 2.5 - 4.6 mg/dL   Albumin 3.0 (L) 3.5 - 5.0 g/dL   GFR, Estimated 11 (L) >60 mL/min   Anion gap 13 5 - 15  CBC  Result Value Ref Range   WBC 8.1 4.0 - 10.5 K/uL   RBC 3.12 (L) 4.22 - 5.81 MIL/uL   Hemoglobin 9.0 (L) 13.0 - 17.0 g/dL   HCT 28.9 (L) 39 - 52 %   MCV 92.6 80.0 - 100.0 fL   MCH 28.8 26.0 - 34.0 pg   MCHC 31.1 30.0 - 36.0 g/dL   RDW 15.7 (H) 11.5 - 15.5 %   Platelets 233 150 - 400 K/uL   nRBC 0.0 0.0 - 0.2 %  Basic metabolic panel  Result Value Ref Range   Sodium 141 135 - 145 mmol/L   Potassium 4.5 3.5 - 5.1 mmol/L   Chloride 102 98 - 111 mmol/L   CO2 26 22 - 32 mmol/L   Glucose, Bld 96 70 - 99 mg/dL   BUN 43 (H) 8 - 23 mg/dL   Creatinine, Ser 4.19 (H) 0.61 - 1.24 mg/dL   Calcium 7.4 (L) 8.9 - 10.3 mg/dL   GFR, Estimated 15 (L) >60 mL/min   Anion gap 13 5 - 15  CBC  Result Value Ref Range   WBC 8.7 4.0 - 10.5 K/uL  RBC 3.49 (L) 4.22 - 5.81 MIL/uL   Hemoglobin 10.1 (L) 13.0 - 17.0 g/dL   HCT 32.0 (L) 39 - 52 %    MCV 91.7 80.0 - 100.0 fL   MCH 28.9 26.0 - 34.0 pg   MCHC 31.6 30.0 - 36.0 g/dL   RDW 15.6 (H) 11.5 - 15.5 %   Platelets 246 150 - 400 K/uL   nRBC 0.0 0.0 - 0.2 %      Assessment & Plan:   Problem List Items Addressed This Visit      Cardiovascular and Mediastinum   Hypertension    Chronic, stable.  Continue Amlodipine and Metoprolol at this time, plan to further reduce as needed based on BP readings.  Recommend he monitor BP at home daily and document for provider + focus on DASH diet.  Labs next visit.  Return in 3 months.      Chronic HFrEF (heart failure with reduced ejection fraction) (HCC) - Primary    Chronic, euvolemic at this time.  Continue Metoprolol and Amlodipine + Plavix and ASA.  Labs up to date, will defer further today as had at dialysis recently he reports.  Recommend: - Reminded to call for an overnight weight gain of >2 pounds or a weekly weight weight of >5 pounds - not adding salt to his food and has been reading food labels. Reviewed the importance of keeping daily sodium intake to 2000mg  daily       Hypotension    Improved this visit with discontinuation of Hydralazine.  Continue Amlodipine + Metoprolol at this time.  Plan to further reduce as needed based on BP readings.  Recommend he continue to monitor BP at home and document for provider.  Check labs next visit.  Return to office in 3 months.        Genitourinary   ESRD (end stage renal disease) ([place])    Ongoing.  Currently doing dialysis Tuesday, Thursday, Saturday.  Continue current medication regimen and collaboration with nephrology.          Follow up plan: Return in about 3 months (around 04/12/2020) for HTN, ESRD, HF -- to meet new provider.

## 2020-01-11 NOTE — Assessment & Plan Note (Signed)
Chronic, euvolemic at this time.  Continue Metoprolol and Amlodipine + Plavix and ASA.  Labs up to date, will defer further today as had at dialysis recently he reports.  Recommend: - Reminded to call for an overnight weight gain of >2 pounds or a weekly weight weight of >5 pounds - not adding salt to his food and has been reading food labels. Reviewed the importance of keeping daily sodium intake to 2000mg  daily

## 2020-01-11 NOTE — Assessment & Plan Note (Signed)
Improved this visit with discontinuation of Hydralazine.  Continue Amlodipine + Metoprolol at this time.  Plan to further reduce as needed based on BP readings.  Recommend he continue to monitor BP at home and document for provider.  Check labs next visit.  Return to office in 3 months.

## 2020-01-14 ENCOUNTER — Other Ambulatory Visit: Payer: Self-pay | Admitting: Nephrology

## 2020-01-16 ENCOUNTER — Encounter: Payer: Self-pay | Admitting: Surgery

## 2020-01-16 ENCOUNTER — Ambulatory Visit (INDEPENDENT_AMBULATORY_CARE_PROVIDER_SITE_OTHER): Payer: Medicaid Other | Admitting: Surgery

## 2020-01-16 ENCOUNTER — Other Ambulatory Visit: Payer: Self-pay

## 2020-01-16 VITALS — BP 148/91 | HR 72 | Temp 98.2°F | Ht 64.0 in | Wt 132.0 lb

## 2020-01-16 DIAGNOSIS — Z09 Encounter for follow-up examination after completed treatment for conditions other than malignant neoplasm: Secondary | ICD-10-CM

## 2020-01-16 NOTE — Patient Instructions (Addendum)
Please see follow up appointment below.

## 2020-01-22 NOTE — [place] Notes (Signed)
S/p rob PD placement Doing well Doing HD  PE: NAD Abd: soft, nt, incisions c/d/i, no infection \ A/P  Doing well No complications Ok to use PD next week

## 2020-01-28 ENCOUNTER — Telehealth: Payer: Self-pay | Admitting: *Deleted

## 2020-01-28 NOTE — Telephone Encounter (Signed)
Patient called and had a CAPD Catheter insertion on 12/31/19 by Dr Dahlia Byes. He stated that it is very sore and when he went to dialysis they wanted him to call to see why it was placed so close to the skin. Please call and advise

## 2020-01-28 NOTE — Telephone Encounter (Signed)
Spoke with patient to gather more information regarding catheter. Patient states he has some discomfort and pain such as a pulling sensation when he is bending over, but it isn't constant pain. Patient also informed me that during his dialysis treatment, the nurse ask him to give our office a call to ask why his catheter was so close to the skin. Per Dr.Pabon states he will see the patient Wednesday afternoon to check the catheter site. Patient verbalized understanding and has no further questions.

## 2020-01-30 ENCOUNTER — Encounter: Payer: Medicaid Other | Admitting: Surgery

## 2020-01-30 DIAGNOSIS — N2581 Secondary hyperparathyroidism of renal origin: Secondary | ICD-10-CM | POA: Insufficient documentation

## 2020-01-30 DIAGNOSIS — N185 Chronic kidney disease, stage 5: Secondary | ICD-10-CM | POA: Insufficient documentation

## 2020-01-30 DIAGNOSIS — I129 Hypertensive chronic kidney disease with stage 1 through stage 4 chronic kidney disease, or unspecified chronic kidney disease: Secondary | ICD-10-CM | POA: Insufficient documentation

## 2020-02-04 ENCOUNTER — Encounter: Payer: Self-pay | Admitting: Surgery

## 2020-02-04 ENCOUNTER — Other Ambulatory Visit: Payer: Self-pay

## 2020-02-04 ENCOUNTER — Ambulatory Visit: Payer: Medicaid Other | Admitting: Surgery

## 2020-02-06 ENCOUNTER — Ambulatory Visit: Payer: Medicaid Other | Admitting: Surgery

## 2020-02-11 ENCOUNTER — Ambulatory Visit: Payer: Self-pay | Admitting: *Deleted

## 2020-02-11 ENCOUNTER — Ambulatory Visit: Payer: Medicaid Other | Admitting: Nurse Practitioner

## 2020-02-11 NOTE — Telephone Encounter (Signed)
B/P now with home cuff 169/112, hr 57. B/p before PD treatment today was 220/190 reported the wife. Denies CP/SOB/dizziness, blurred vision/weakness.Took  Norvasc 10 mg and Toprol xl 50mg  prior to the peritoneal dialysis treatment today. Requesting name of medication that was discontinued in November, it was Hydralazine. Medicaid has moved him to the [facility] physicians which he has appointment tonight at 6:20p. Reviewed urgent symptoms and parameters requiring immediate evaluation.   Reason for Disposition . Systolic BP  >= 142 OR Diastolic >= 395  Answer Assessment - Initial Assessment Questions 1. BLOOD PRESSURE: "What is the blood pressure?" "Did you take at least two measurements 5 minutes apart?"     220/190 Saturday, 165/110 after peritoneal dialysis.  2. ONSET: "When did you take your blood pressure?"     At the training center. 3. HOW: "How did you obtain the blood pressure?" (e.g., visiting nurse, automatic home BP monitor)     At his PD treatment 4. HISTORY: "Do you have a history of high blood pressure?"     yes 5. MEDICATIONS: "Are you taking any medications for blood pressure?" "Have you missed any doses recently?"     Yes 6. OTHER SYMPTOMS: "Do you have any symptoms?" (e.g., headache, chest pain, blurred vision, difficulty breathing, weakness)     none 7. PREGNANCY: "Is there any chance you are pregnant?" "When was your last menstrual period?"     na  Protocols used: BLOOD PRESSURE - HIGH-A-AH

## 2020-02-27 ENCOUNTER — Other Ambulatory Visit: Payer: Self-pay

## 2020-02-27 ENCOUNTER — Other Ambulatory Visit: Payer: Self-pay | Admitting: Surgery

## 2020-02-27 ENCOUNTER — Encounter: Payer: Self-pay | Admitting: Surgery

## 2020-02-27 ENCOUNTER — Ambulatory Visit (INDEPENDENT_AMBULATORY_CARE_PROVIDER_SITE_OTHER): Payer: Medicaid Other | Admitting: Surgery

## 2020-02-27 VITALS — BP 139/96 | HR 70 | Temp 98.8°F | Ht 64.0 in | Wt 131.0 lb

## 2020-02-27 DIAGNOSIS — L720 Epidermal cyst: Secondary | ICD-10-CM | POA: Diagnosis not present

## 2020-02-27 NOTE — Progress Notes (Signed)
Procedure 1.  Excision of 3.2 cm left neck mass EIC 2.  Complex repair of an 8 cm left neck  Diagnosis : Epidermal inclusion cyst left neck  EBL: 2cc  Anesthesia: lidocaine 1% w epi 10 cc  complications None  Patient with a symptomatic left neck epidermal inclusion cyst and need for excision.  Patient was explained about the procedure in detail.  Risks, benefits and possible applications including but not limited to: Bleeding, infection hematomas and deformities.  Awake consent was obtained.  He was placed in a supine position and was prepped and draped in the usual sterile fashion.  Local anesthetic was infiltrated.  Elliptical incision was created incorporating epidermal inclusion cyst.  We were able to dissect and remove the cyst from the platysma muscle shaving it off with Metzenbaum scissors.  Specimen was sent for permanent pathology.  Hemostasis was obtained with pressure.  Due to the defect I needed to perform a complex repair and random cutaneous flaps were developed in the subcu space within the neck to be able to perform a tension-free repair.  We then closed the dermal tissue with multiple interrupted 3-0 Vicryls and the skin with 4-0 Monocryl in a subcuticular fashion.  Dermabond was applied. Complications

## 2020-02-27 NOTE — Patient Instructions (Signed)
We have removed a Cyst in our office today.  You have sutures under the skin that will dissolve and also dermabond (skin glue) on top of your skin which will come off on it's own in 10-14 days.  You may shower in 48 hours, this is on 02/29/2020.  Avoid Strenuous activities that will make you sweat during the next 48 hours to avoid the glue coming off prematurely. Avoid activities that will place pressure to this area of the body for 1-2 weeks to avoid re-injury to incision site.  Please see your follow-up appointment provided. We will see you back in office to make sure this area is healed and to review the final pathology. If you have any questions or concerns prior to this appointment, call our office and speak with a nurse.    Excision of Skin Cysts or Lesions Excision of a skin lesion refers to the removal of a section of skin by making small cuts (incisions) in the skin. This procedure may be done to remove a cancerous (malignant) or noncancerous (benign) growth on the skin. It is typically done to treat or prevent cancer or infection. It may also be done to improve cosmetic appearance. The procedure may be done to remove:  Cancerous growths, such as basal cell carcinoma, squamous cell carcinoma, or melanoma.  Noncancerous growths, such as a cyst or lipoma.  Growths, such as moles or skin tags, which may be removed for cosmetic reasons.  Various excision or surgical techniques may be used depending on your condition, the location of the lesion, and your overall health. Tell a health care provider about:  Any allergies you have.  All medicines you are taking, including vitamins, herbs, eye drops, creams, and over-the-counter medicines.  Any problems you or family members have had with anesthetic medicines.  Any blood disorders you have.  Any surgeries you have had.  Any medical conditions you have.  Whether you are pregnant or may be pregnant. What are the risks? Generally,  this is a safe procedure. However, problems may occur, including:  Bleeding.  Infection.  Scarring.  Recurrence of the cyst, lipoma, or cancer.  Changes in skin sensation or appearance, such as discoloration or swelling.  Reaction to the anesthetics.  Allergic reaction to surgical materials or ointments.  Damage to nerves, blood vessels, muscles, or other structures.  Continued pain.  What happens before the procedure?  Ask your health care provider about: ? Changing or stopping your regular medicines. This is especially important if you are taking diabetes medicines or blood thinners. ? Taking medicines such as aspirin and ibuprofen. These medicines can thin your blood. Do not take these medicines before your procedure if your health care provider instructs you not to.  You may be asked to take certain medicines.  You may be asked to stop smoking.  You may have an exam or testing.  Plan to have someone take you home after the procedure.  Plan to have someone help you with activities during recovery. What happens during the procedure?  To reduce your risk of infection: ? Your health care team will wash or sanitize their hands. ? Your skin will be washed with soap.  You will be given a medicine to numb the area (local anesthetic).  One of the following excision techniques will be performed.  At the end of any of these procedures, antibiotic ointment will be applied as needed. Each of the following techniques may vary among health care providers and hospitals. Complete  Surgical Excision The area of skin that needs to be removed will be marked with a pen. Using a small scalpel or scissors, the surgeon will gently cut around and under the lesion until it is completely removed. The lesion will be placed in a fluid and sent to the lab for examination. If necessary, bleeding will be controlled with a device that delivers heat (electrocautery). The edges of the wound may be  stitched (sutured) together, and a bandage (dressing) will be applied. This procedure may be performed to treat a cancerous growth or a noncancerous cyst or lesion. Excision of a Cyst The surgeon will make an incision on the cyst. The entire cyst will be removed through the incision. The incision may be closed with sutures. Shave Excision During shave excision, the surgeon will use a small blade or an electrically heated loop instrument to shave off the lesion. This may be done to remove a mole or a skin tag. The wound will usually be left to heal on its own without sutures. Punch Excision During punch excision, the surgeon will use a small tool that is like a cookie cutter or a hole punch to cut a circle shape out of the skin. The outer edges of the skin will be sutured together. This may be done to remove a mole or a scar or to perform a biopsy of the lesion. Mohs Micrographic Surgery During Mohs micrographic surgery, layers of the lesion will be removed with a scalpel or a loop instrument and will be examined right away under a microscope. Layers will be removed until all of the abnormal or cancerous tissue has been removed. This procedure is minimally invasive, and it ensures the best cosmetic outcome. It involves the removal of as little normal tissue as possible. Mohs is usually done to treat skin cancer, such as basal cell carcinoma or squamous cell carcinoma, particularly on the face and ears. Depending on the size of the surgical wound, it may be sutured closed. What happens after the procedure?  Return to your normal activities as told by your health care provider.  Talk with your health care provider to discuss any test results, treatment options, and if necessary, the need for more tests. This information is not intended to replace advice given to you by your health care provider. Make sure you discuss any questions you have with your health care provider. Document Released: 05/05/2009  Document Revised: 07/17/2015 Document Reviewed: 03/27/2014 Elsevier Interactive Patient Education  [employer].

## 2020-02-29 DIAGNOSIS — I6523 Occlusion and stenosis of bilateral carotid arteries: Secondary | ICD-10-CM | POA: Insufficient documentation

## 2020-02-29 DIAGNOSIS — I25810 Atherosclerosis of coronary artery bypass graft(s) without angina pectoris: Secondary | ICD-10-CM | POA: Insufficient documentation

## 2020-03-05 ENCOUNTER — Other Ambulatory Visit: Payer: Self-pay

## 2020-03-05 ENCOUNTER — Telehealth (INDEPENDENT_AMBULATORY_CARE_PROVIDER_SITE_OTHER): Payer: Medicaid Other | Admitting: Surgery

## 2020-03-05 DIAGNOSIS — T85611A Breakdown (mechanical) of intraperitoneal dialysis catheter, initial encounter: Secondary | ICD-10-CM

## 2020-03-05 DIAGNOSIS — Z09 Encounter for follow-up examination after completed treatment for conditions other than malignant neoplasm: Secondary | ICD-10-CM

## 2020-03-05 NOTE — Progress Notes (Signed)
This service was provided via telemedicine. I placed a phone call to his house.  The patient consented to the visit being carried via telemedicine ( limitation, risks and benefits were explaiend.)  Patient's location:  home  Provider's location:  office   People participating in this telemedicine visit:  Pt and provider  Time spent:  5 minutes  S/p EIC neck doing very well, pathology d/w pt. He has no complaints F/u prn

## 2020-03-19 ENCOUNTER — Telehealth (INDEPENDENT_AMBULATORY_CARE_PROVIDER_SITE_OTHER): Payer: Self-pay

## 2020-03-19 NOTE — Telephone Encounter (Signed)
I attempted to contact the regarding getting scheduled for a permcath removal and due to the voicemail box not being set up I was unable to leave a message. Patient is scheduled with Dr. Delana Meyer for 03/25/20 with a 10:15 am arrival to the [facility]. Covid testing on 03/21/20 at the [place] between 8-1 pm. Pre-procedure instructions will be faxed to [facility].

## 2020-03-21 ENCOUNTER — Other Ambulatory Visit: Payer: Self-pay

## 2020-03-21 ENCOUNTER — Other Ambulatory Visit
Admission: RE | Admit: 2020-03-21 | Discharge: 2020-03-21 | Disposition: A | Discharge status: Home / Self Care | Payer: Medicaid Other | Source: Ambulatory Visit | Attending: Vascular Surgery | Admitting: Vascular Surgery

## 2020-03-21 DIAGNOSIS — Z20822 Contact with and (suspected) exposure to covid-19: Secondary | ICD-10-CM | POA: Diagnosis not present

## 2020-03-21 DIAGNOSIS — Z01812 Encounter for preprocedural laboratory examination: Secondary | ICD-10-CM | POA: Diagnosis not present

## 2020-03-22 LAB — SARS CORONAVIRUS 2 (TAT 6-24 HRS): SARS Coronavirus 2: NEGATIVE

## 2020-03-24 ENCOUNTER — Telehealth (INDEPENDENT_AMBULATORY_CARE_PROVIDER_SITE_OTHER): Payer: Self-pay

## 2020-03-24 NOTE — Telephone Encounter (Signed)
I attempted to contact the patient to reschedule his appt with Dr. Delana Meyer for a permcath removal and the phone declined the call. I contacted Garnette Scheuermann at [facility] and explained that the patient will be rescheduled due to unforseen circumstances and I will fax the information to her. Patient rescheduled to 04/08/20 with a 11:00 am arrival time to the [facility] and covid testing on 04/04/20 between 8-1 pm at  [facility] [place].

## 2020-03-25 DIAGNOSIS — N186 End stage renal disease: Secondary | ICD-10-CM

## 2020-03-26 ENCOUNTER — Other Ambulatory Visit: Payer: Self-pay | Admitting: Nephrology

## 2020-03-26 ENCOUNTER — Telehealth: Payer: Self-pay

## 2020-03-26 ENCOUNTER — Other Ambulatory Visit: Payer: Self-pay

## 2020-03-26 DIAGNOSIS — T85611A Breakdown (mechanical) of intraperitoneal dialysis catheter, initial encounter: Secondary | ICD-10-CM

## 2020-03-26 NOTE — Telephone Encounter (Signed)
Per Dr.Pabon-patient needs abdominal xray tomorrow and added to Dr.Pabons schedule 03/31/20.spoke with patient wife and she verbalized understanding.

## 2020-03-27 ENCOUNTER — Ambulatory Visit
Admission: RE | Admit: 2020-03-27 | Discharge: 2020-03-27 | Disposition: A | Discharge status: Home / Self Care | Payer: Medicare Other | Source: Ambulatory Visit | Attending: Surgery | Admitting: Surgery

## 2020-03-27 ENCOUNTER — Other Ambulatory Visit: Payer: Self-pay

## 2020-03-27 DIAGNOSIS — T85611A Breakdown (mechanical) of intraperitoneal dialysis catheter, initial encounter: Secondary | ICD-10-CM | POA: Diagnosis present

## 2020-03-28 ENCOUNTER — Telehealth: Payer: Self-pay

## 2020-03-28 NOTE — Telephone Encounter (Signed)
Spoke with pt regarding IR appt at 9 am at [facility] and the r/s appt with Dr. Dahlia Byes on 04/02/20 @ 3:45 pm. Rosezena Sensor understanding.

## 2020-03-28 NOTE — Addendum Note (Signed)
Addended by: Caroleen Hamman F on: 03/28/2020 12:02 PM   Modules accepted: Orders

## 2020-03-31 ENCOUNTER — Ambulatory Visit: Payer: Self-pay | Admitting: Surgery

## 2020-03-31 NOTE — Progress Notes (Signed)
Patient placed on schedule for IR procedure with PD catheter 04/02/2020 for 0900. Spoke with patient's wife on phone with instructions given. Made aware he needs to be here @ 0900 to have procedure @ 0930. Wife states he has H/D that am, however will cut dialysis run that am thus to be here @ 0900. Informed her that is necessary for him to be here no later in that due to schedule he may not be able to have procedure if late. Stated understanding.

## 2020-04-01 ENCOUNTER — Other Ambulatory Visit: Payer: Self-pay | Admitting: Radiology

## 2020-04-02 ENCOUNTER — Ambulatory Visit
Admission: RE | Admit: 2020-04-02 | Discharge: 2020-04-02 | Disposition: A | Discharge status: Home / Self Care | Payer: Medicaid Other | Source: Ambulatory Visit | Attending: Surgery | Admitting: Surgery

## 2020-04-02 ENCOUNTER — Ambulatory Visit: Payer: Self-pay | Admitting: Surgery

## 2020-04-03 ENCOUNTER — Other Ambulatory Visit: Payer: Self-pay | Admitting: Radiology

## 2020-04-04 ENCOUNTER — Other Ambulatory Visit: Payer: Self-pay | Admitting: Student

## 2020-04-04 ENCOUNTER — Other Ambulatory Visit: Payer: Self-pay | Admitting: Radiology

## 2020-04-07 ENCOUNTER — Telehealth (INDEPENDENT_AMBULATORY_CARE_PROVIDER_SITE_OTHER): Payer: Self-pay

## 2020-04-07 ENCOUNTER — Other Ambulatory Visit: Admission: RE | Admit: 2020-04-07 | Payer: Medicare Other | Source: Ambulatory Visit

## 2020-04-07 ENCOUNTER — Ambulatory Visit
Admission: RE | Admit: 2020-04-07 | Discharge: 2020-04-07 | Disposition: A | Discharge status: Home / Self Care | Payer: Medicare Other | Source: Ambulatory Visit | Attending: Surgery | Admitting: Surgery

## 2020-04-07 ENCOUNTER — Other Ambulatory Visit: Payer: Self-pay

## 2020-04-07 DIAGNOSIS — Y841 Kidney dialysis as the cause of abnormal reaction of the patient, or of later complication, without mention of misadventure at the time of the procedure: Secondary | ICD-10-CM | POA: Diagnosis not present

## 2020-04-07 DIAGNOSIS — E785 Hyperlipidemia, unspecified: Secondary | ICD-10-CM | POA: Insufficient documentation

## 2020-04-07 DIAGNOSIS — Z87891 Personal history of nicotine dependence: Secondary | ICD-10-CM | POA: Diagnosis not present

## 2020-04-07 DIAGNOSIS — I132 Hypertensive heart and chronic kidney disease with heart failure and with stage 5 chronic kidney disease, or end stage renal disease: Secondary | ICD-10-CM | POA: Diagnosis not present

## 2020-04-07 DIAGNOSIS — T85611A Breakdown (mechanical) of intraperitoneal dialysis catheter, initial encounter: Secondary | ICD-10-CM | POA: Diagnosis present

## 2020-04-07 DIAGNOSIS — Z992 Dependence on renal dialysis: Secondary | ICD-10-CM | POA: Diagnosis not present

## 2020-04-07 DIAGNOSIS — N186 End stage renal disease: Secondary | ICD-10-CM | POA: Diagnosis not present

## 2020-04-07 DIAGNOSIS — I509 Heart failure, unspecified: Secondary | ICD-10-CM | POA: Diagnosis not present

## 2020-04-07 DIAGNOSIS — I2510 Atherosclerotic heart disease of native coronary artery without angina pectoris: Secondary | ICD-10-CM | POA: Insufficient documentation

## 2020-04-07 HISTORY — PX: IR IMAGE GUIDED DRAINAGE PERCUT CATH  PERITONEAL RETROPERIT: IMG5467

## 2020-04-07 MED ORDER — SODIUM CHLORIDE 0.9 % IV SOLN: INTRAVENOUS | Status: DC

## 2020-04-07 MED ORDER — CEFAZOLIN SODIUM-DEXTROSE 2-4 GM/100ML-% IV SOLN: 2.0000 g | INTRAVENOUS | Status: AC

## 2020-04-07 MED ORDER — CEFAZOLIN SODIUM-DEXTROSE 2-4 GM/100ML-% IV SOLN
INTRAVENOUS | Status: AC
  Administered 2020-04-07: 2 g via INTRAVENOUS
  Filled 2020-04-07: qty 100

## 2020-04-07 NOTE — Telephone Encounter (Signed)
Patient has been rescheduled due to not getting a covid test on 04/04/20 for his permcath removal on 04/08/20. Patient rescheduled to 04/15/20 with a 2:30 pm arrival time to the [facility]. Covid testing on 04/11/20 between 8-1 pm at the [place]. Pre-procedure instructions will be faxed to his dialysis center as requested.

## 2020-04-07 NOTE — Procedures (Signed)
Interventional Radiology Procedure Note  Procedure: PD catheter manipulation   Indication: Poorly functioning PD cath  Findings: Please refer to procedural dictation for full description.  Complications: None  EBL: < 10 mL  Miachel Roux, MD [phone]

## 2020-04-07 NOTE — H&P (Signed)
Chief Complaint: PD catheter malfunction. Team is requesting a PD catheter manipulation  Referring Physician(s): Caroleen Hamman F  Supervising Physician: Mir, Sharen Heck  Patient Status: [facility] - Out-pt  History of Present Illness: Anthony Hess is a 65 y.o. male  65 y.o male outpatient. Smoker. History of CAD, HTN, HLD, CHF, bladder cancer, ESRD on PD via placed on 11.8.21 with 3 cuffs. Abd X-ray from 2.3.22 shows no abnormality. Per Epic patient has a tunneled HD catheter that was placed by cardiovascular surgery and is scheduled to be removed on 2.15.21. Team is reporting malfunction with the existing PD catheter. Patient states that his able to instill but not able to drain the dialyzer. He describes it a  "clog that got worse over the last week".  Patient presents for HPD catheter manipulation.   Past Medical History:  Diagnosis Date  . Bladder tumor   . Cancer [facility])    Bladder  . Chronic kidney disease    renal insufficiency  . Coronary artery disease   . History of kidney stones   . Hypertension   . Myocardial infarction ([place])   . Wears glasses     Past Surgical History:  Procedure Laterality Date  . CAPD INSERTION N/A 12/31/2019   Procedure: LAPAROSCOPIC INSERTION CONTINUOUS AMBULATORY PERITONEAL DIALYSIS  (CAPD) CATHETER;  Surgeon: Jules Husbands, MD;  Location: [facility] ORS;  Service: General;  Laterality: N/A;  . CORONARY ARTERY BYPASS GRAFT N/A 12/27/2018   Procedure: CORONARY ARTERY BYPASS GRAFTING (CABG) X 4 ON PUMP USING RIGHT & LEFT INTERNAL MAMMARY ARTERY LEFT RADIAL ARTERY ENDOSCOPICALLY HARVESTED;  Surgeon: [name], MD;  Location: [place];  Service: Open Heart Surgery;  Laterality: N/A;  . CYSTOSCOPY W/ RETROGRADES Bilateral 05/15/2019   Procedure: CYSTOSCOPY WITH RETROGRADE PYELOGRAM;  Surgeon: Abbie Sons, MD;  Location: [facility] ORS;  Service: Urology;  Laterality: Bilateral;  . CYSTOSCOPY WITH BIOPSY N/A 05/15/2019   Procedure: CYSTOSCOPY WITH bladder BIOPSY;   Surgeon: Abbie Sons, MD;  Location: [facility] ORS;  Service: Urology;  Laterality: N/A;  . DIALYSIS/PERMA CATHETER INSERTION N/A 12/28/2019   Procedure: DIALYSIS/PERMA CATHETER INSERTION;  Surgeon: Algernon Huxley, MD;  Location: [place] CV LAB;  Service: Cardiovascular;  Laterality: N/A;  . LEFT HEART CATH AND CORONARY ANGIOGRAPHY Left 12/20/2018   Procedure: LEFT HEART CATH AND CORONARY ANGIOGRAPHY;  Surgeon: Isaias Cowman, MD;  Location: [place] CV [facility];  Service: Cardiovascular;  Laterality: Left;  . RADIAL ARTERY HARVEST Left 12/27/2018   Procedure: ENDOSCOPIC RADIAL ARTERY HARVEST;  Surgeon: [name], MD;  Location: [place];  Service: Open Heart Surgery;  Laterality: Left;  . TEE WITHOUT CARDIOVERSION N/A 12/27/2018   Procedure: TRANSESOPHAGEAL ECHOCARDIOGRAM (TEE);  Surgeon: [name], MD;  Location: [place];  Service: Open Heart Surgery;  Laterality: N/A;  . TUMOR REMOVAL  2019   Bladder    Allergies: Patient has no known allergies.  Medications: Prior to Admission medications   Medication Sig Start Date End Date Taking? Authorizing Provider  amLODipine (NORVASC) 10 MG tablet Take 1 tablet (10 mg total) by mouth daily. 01/01/20   Fritzi Mandes, MD  aspirin EC 81 MG tablet Take 81 mg by mouth daily.    [name], MD  atorvastatin (LIPITOR) 40 MG tablet Take 1 tablet (40 mg total) by mouth daily. 01/01/20   Fritzi Mandes, MD  calcitRIOL (ROCALTROL) 0.5 MCG capsule Take 1 capsule (0.5 mcg total) by mouth daily. 01/01/20   Fritzi Mandes, MD  calcium  acetate (PHOSLO) 667 MG capsule Take 2 capsules (1,334 mg total) by mouth 3 (three) times daily with meals. 01/01/20   Fritzi Mandes, MD  calcium carbonate (OS-CAL - DOSED IN MG OF ELEMENTAL CALCIUM) 1250 (500 Ca) MG tablet Take 1 tablet (1,250 mg total) by mouth 2 (two) times daily between meals. 01/01/20   Fritzi Mandes, MD  clopidogrel (PLAVIX) 75 MG tablet Take 1 tablet (75 mg total) by mouth daily. 01/01/20   Fritzi Mandes, MD  metoprolol succinate (TOPROL-XL) 50 MG 24 hr tablet Take 1 tablet (50 mg total) by mouth daily. Take with or immediately following a meal. 01/01/20   Fritzi Mandes, MD  Vitamin D, Ergocalciferol, (DRISDOL) 1.25 MG (50000 UNIT) CAPS capsule Take 1 capsule (50,000 Units total) by mouth every 7 (seven) days. 01/05/20   Fritzi Mandes, MD     Family History  Family history unknown: Yes    Social History   Socioeconomic History  . Marital status: Married    Spouse name: Not on file  . Number of children: Not on file  . Years of education: Not on file  . Highest education level: Not on file  Occupational History  . Not on file  Tobacco Use  . Smoking status: Former Smoker    Packs/day: 0.25    Types: Cigarettes    Quit date: 02/19/2019    Years since quitting: 1.1  . Smokeless tobacco: Never Used  Vaping Use  . Vaping Use: Never used  Substance and Sexual Activity  . Alcohol use: Never  . Drug use: Never  . Sexual activity: Yes  Other Topics Concern  . Not on file  Social History Narrative  . Not on file   Social Determinants of Health   Financial Resource Strain: Not on file  Food Insecurity: Not on file  Transportation Needs: Not on file  Physical Activity: Not on file  Stress: Not on file  Social Connections: Not on file    Review of Systems: A 12 point ROS discussed and pertinent positives are indicated in the HPI above.  All other systems are negative.  Review of Systems  Constitutional: Negative for fever.  HENT: Negative for congestion.   Respiratory: Negative for cough and shortness of breath.   Cardiovascular: Negative for chest pain.  Gastrointestinal: Negative for abdominal pain.  Neurological: Negative for headaches.  Psychiatric/Behavioral: Negative for behavioral problems and confusion.    Vital Signs: BP (!) 163/106   Pulse 70   Temp 97.8 F (36.6 C) (Oral)   Resp 17   SpO2 100%   Physical Exam Vitals and nursing note reviewed.   Constitutional:      Appearance: He is well-developed and well-nourished.  HENT:     Head: Normocephalic.  Cardiovascular:     Rate and Rhythm: Normal rate and regular rhythm.     Heart sounds: Normal heart sounds.  Pulmonary:     Effort: Pulmonary effort is normal.     Breath sounds: Normal breath sounds.  Musculoskeletal:        General: Normal range of motion.     Cervical back: Normal range of motion.  Skin:    General: Skin is dry.  Neurological:     Mental Status: He is alert and oriented to person, place, and time.  Psychiatric:        Mood and Affect: Mood and affect normal.     Imaging: DG Abd 1 View  Result Date: 03/27/2020 CLINICAL DATA:  65 year old male with  malfunctioning peritoneal dialysis catheter. EXAM: ABDOMEN - 1 VIEW COMPARISON:  None. FINDINGS: . No dialysis catheter positioned inserted into the peritoneum via the the left lower quadrant with the distal tip also coiled in the left lower quadrant. No evidence of kinking or catheter disruption. The bowel gas pattern is normal. No radio-opaque calculi or other significant radiographic abnormality are seen. IMPRESSION: No radiographic abnormality of the left lower quadrant indwelling peritoneal dialysis catheter. Consider catheter check and possible manipulation by Interventional Radiology as clinically indicated. Ruthann Cancer, MD [facility] Radiology Electronically Signed   By: Ruthann Cancer MD   On: 03/27/2020 16:13    Labs:  CBC: Recent Labs    01/04/20 1540 01/07/20 2257 01/08/20 0928 01/09/20 0613  WBC 6.8 8.9 8.1 8.7  HGB 9.1* 8.6* 9.0* 10.1*  HCT 29.6* 28.1* 28.9* 32.0*  PLT 191 242 233 246    COAGS: No results for input(s): INR, APTT in the last 8760 hours.  BMP: Recent Labs    05/08/19 1503 05/15/19 1049 06/03/19 1036 12/27/19 1108 01/04/20 1542 01/07/20 2257 01/08/20 0928 01/09/20 0613  NA 143   < > 140   < > 143 144 144 141  K  5.5*   < > 4.6   < > 4.2 4.5 4.5 4.5  CL 114*   < > 115*   < > 105 109 109 102  CO2 20*  --  18*   < > 22 21* 22 26  GLUCOSE 73   < > 124*   < > 122* 112* 87 96  BUN 77*   < > 55*   < > 52* 68* 69* 43*  CALCIUM 7.5*  --  7.1*   < > 7.1* 6.7* 6.5* 7.4*  CREATININE 3.40*   < > 3.26*   < > 4.59* 5.53* 5.62* 4.19*  GFRNONAA 18*  --  19*   < > 13* 11* 11* 15*  GFRAA 21*  --  22*  --  15*  --   --   --    < > = values in this interval not displayed.    LIVER FUNCTION TESTS: Recent Labs    12/27/19 1108 12/28/19 0524 12/29/19 0913 01/04/20 1542 01/08/20 0928  BILITOT 0.7 0.5  --  <0.2  --   AST 30 21  --  30  --   ALT 29 23  --  8  --   ALKPHOS 56 55  --  67  --   PROT 6.4* 5.5*  --  5.7*  --   ALBUMIN 3.4* 2.9* 2.9* 3.5* 3.0*     Assessment and Plan:   65 y.o male outpatient. Smoker. History of CAD, HTN, HLD, CHF, bladder cancer, ESRD on PD via placed on 11.8.21 with 3 cuffs. Abd X-ray from 2.3.22 shows no abnormality. Per Epic patient has a tunneled HD catheter that was placed by cardiovascular surgery and is scheduled to be removed on 2.15.21. Team is reporting malfunction with the existing PD catheter. Patient states that his able to instill but not able to drain the dialyzer. He describes it a "clog that got worse over the last week".  Patient presents for HPD catheter manipulation.   No recent labs. All medications are within acceptable parameters. NKDA. Patient has been NPO since midnight.  Risks and benefits discussed with the patient including bleeding, infection, damage to adjacent structures, bowel perforation/fistula connection, and sepsis.  All of the patient's questions were answered, patient is agreeable  to proceed. Consent signed and in chart.    Thank you for this interesting consult.  I greatly enjoyed meeting Anthony Hess and look forward to participating in their care.  A copy of this report was sent to the requesting provider on this date.  Electronically  Signed: Jacqualine Mau, NP 04/07/2020, 1:03 PM   I spent a total of  30 Minutes   in face to face in clinical consultation, greater than 50% of which was counseling/coordinating care for PD catheter manipulation

## 2020-04-07 NOTE — H&P (View-Only) (Signed)
Chief Complaint: PD catheter malfunction. Team is requesting a PD catheter manipulation  Referring Physician(s): Caroleen Hamman F  Supervising Physician: Mir, Sharen Heck  Patient Status: [facility] - Out-pt  History of Present Illness: Anthony Hess is a 65 y.o. male  65 y.o male outpatient. Smoker. History of CAD, HTN, HLD, CHF, bladder cancer, ESRD on PD via placed on 11.8.21 with 3 cuffs. Abd X-ray from 2.3.22 shows no abnormality. Per Epic patient has a tunneled HD catheter that was placed by cardiovascular surgery and is scheduled to be removed on 2.15.21. Team is reporting malfunction with the existing PD catheter. Patient states that his able to instill but not able to drain the dialyzer. He describes it a  "clog that got worse over the last week".  Patient presents for HPD catheter manipulation.   Past Medical History:  Diagnosis Date  . Bladder tumor   . Cancer [facility])    Bladder  . Chronic kidney disease    renal insufficiency  . Coronary artery disease   . History of kidney stones   . Hypertension   . Myocardial infarction ([place])   . Wears glasses     Past Surgical History:  Procedure Laterality Date  . CAPD INSERTION N/A 12/31/2019   Procedure: LAPAROSCOPIC INSERTION CONTINUOUS AMBULATORY PERITONEAL DIALYSIS  (CAPD) CATHETER;  Surgeon: Jules Husbands, MD;  Location: [facility] ORS;  Service: General;  Laterality: N/A;  . CORONARY ARTERY BYPASS GRAFT N/A 12/27/2018   Procedure: CORONARY ARTERY BYPASS GRAFTING (CABG) X 4 ON PUMP USING RIGHT & LEFT INTERNAL MAMMARY ARTERY LEFT RADIAL ARTERY ENDOSCOPICALLY HARVESTED;  Surgeon: [name], MD;  Location: [place];  Service: Open Heart Surgery;  Laterality: N/A;  . CYSTOSCOPY W/ RETROGRADES Bilateral 05/15/2019   Procedure: CYSTOSCOPY WITH RETROGRADE PYELOGRAM;  Surgeon: Abbie Sons, MD;  Location: [facility] ORS;  Service: Urology;  Laterality: Bilateral;  . CYSTOSCOPY WITH BIOPSY N/A 05/15/2019   Procedure: CYSTOSCOPY WITH bladder BIOPSY;   Surgeon: Abbie Sons, MD;  Location: [facility] ORS;  Service: Urology;  Laterality: N/A;  . DIALYSIS/PERMA CATHETER INSERTION N/A 12/28/2019   Procedure: DIALYSIS/PERMA CATHETER INSERTION;  Surgeon: Algernon Huxley, MD;  Location: [place] CV LAB;  Service: Cardiovascular;  Laterality: N/A;  . LEFT HEART CATH AND CORONARY ANGIOGRAPHY Left 12/20/2018   Procedure: LEFT HEART CATH AND CORONARY ANGIOGRAPHY;  Surgeon: Isaias Cowman, MD;  Location: [place] CV LAB;  Service: Cardiovascular;  Laterality: Left;  . RADIAL ARTERY HARVEST Left 12/27/2018   Procedure: ENDOSCOPIC RADIAL ARTERY HARVEST;  Surgeon: [name], MD;  Location: [place];  Service: Open Heart Surgery;  Laterality: Left;  . TEE WITHOUT CARDIOVERSION N/A 12/27/2018   Procedure: TRANSESOPHAGEAL ECHOCARDIOGRAM (TEE);  Surgeon: [name], MD;  Location: [place];  Service: Open Heart Surgery;  Laterality: N/A;  . TUMOR REMOVAL  2019   Bladder    Allergies: Patient has no known allergies.  Medications: Prior to Admission medications   Medication Sig Start Date End Date Taking? Authorizing Provider  amLODipine (NORVASC) 10 MG tablet Take 1 tablet (10 mg total) by mouth daily. 01/01/20   Fritzi Mandes, MD  aspirin EC 81 MG tablet Take 81 mg by mouth daily.    [name], MD  atorvastatin (LIPITOR) 40 MG tablet Take 1 tablet (40 mg total) by mouth daily. 01/01/20   Fritzi Mandes, MD  calcitRIOL (ROCALTROL) 0.5 MCG capsule Take 1 capsule (0.5 mcg total) by mouth daily. 01/01/20   Fritzi Mandes, MD  calcium  acetate (PHOSLO) 667 MG capsule Take 2 capsules (1,334 mg total) by mouth 3 (three) times daily with meals. 01/01/20   Fritzi Mandes, MD  calcium carbonate (OS-CAL - DOSED IN MG OF ELEMENTAL CALCIUM) 1250 (500 Ca) MG tablet Take 1 tablet (1,250 mg total) by mouth 2 (two) times daily between meals. 01/01/20   Fritzi Mandes, MD  clopidogrel (PLAVIX) 75 MG tablet Take 1 tablet (75 mg total) by mouth daily. 01/01/20   Fritzi Mandes, MD  metoprolol succinate (TOPROL-XL) 50 MG 24 hr tablet Take 1 tablet (50 mg total) by mouth daily. Take with or immediately following a meal. 01/01/20   Fritzi Mandes, MD  Vitamin D, Ergocalciferol, (DRISDOL) 1.25 MG (50000 UNIT) CAPS capsule Take 1 capsule (50,000 Units total) by mouth every 7 (seven) days. 01/05/20   Fritzi Mandes, MD     Family History  Family history unknown: Yes    Social History   Socioeconomic History  . Marital status: Married    Spouse name: Not on file  . Number of children: Not on file  . Years of education: Not on file  . Highest education level: Not on file  Occupational History  . Not on file  Tobacco Use  . Smoking status: Former Smoker    Packs/day: 0.25    Types: Cigarettes    Quit date: 02/19/2019    Years since quitting: 1.1  . Smokeless tobacco: Never Used  Vaping Use  . Vaping Use: Never used  Substance and Sexual Activity  . Alcohol use: Never  . Drug use: Never  . Sexual activity: Yes  Other Topics Concern  . Not on file  Social History Narrative  . Not on file   Social Determinants of Health   Financial Resource Strain: Not on file  Food Insecurity: Not on file  Transportation Needs: Not on file  Physical Activity: Not on file  Stress: Not on file  Social Connections: Not on file    Review of Systems: A 12 point ROS discussed and pertinent positives are indicated in the HPI above.  All other systems are negative.  Review of Systems  Constitutional: Negative for fever.  HENT: Negative for congestion.   Respiratory: Negative for cough and shortness of breath.   Cardiovascular: Negative for chest pain.  Gastrointestinal: Negative for abdominal pain.  Neurological: Negative for headaches.  Psychiatric/Behavioral: Negative for behavioral problems and confusion.    Vital Signs: BP (!) 163/106   Pulse 70   Temp 97.8 F (36.6 C) (Oral)   Resp 17   SpO2 100%   Physical Exam Vitals and nursing note reviewed.   Constitutional:      Appearance: He is well-developed and well-nourished.  HENT:     Head: Normocephalic.  Cardiovascular:     Rate and Rhythm: Normal rate and regular rhythm.     Heart sounds: Normal heart sounds.  Pulmonary:     Effort: Pulmonary effort is normal.     Breath sounds: Normal breath sounds.  Musculoskeletal:        General: Normal range of motion.     Cervical back: Normal range of motion.  Skin:    General: Skin is dry.  Neurological:     Mental Status: He is alert and oriented to person, place, and time.  Psychiatric:        Mood and Affect: Mood and affect normal.     Imaging: DG Abd 1 View  Result Date: 03/27/2020 CLINICAL DATA:  65 year old male with  malfunctioning peritoneal dialysis catheter. EXAM: ABDOMEN - 1 VIEW COMPARISON:  None. FINDINGS: . No dialysis catheter positioned inserted into the peritoneum via the the left lower quadrant with the distal tip also coiled in the left lower quadrant. No evidence of kinking or catheter disruption. The bowel gas pattern is normal. No radio-opaque calculi or other significant radiographic abnormality are seen. IMPRESSION: No radiographic abnormality of the left lower quadrant indwelling peritoneal dialysis catheter. Consider catheter check and possible manipulation by Interventional Radiology as clinically indicated. Ruthann Cancer, MD [facility] Electronically Signed   By: Ruthann Cancer MD   On: 03/27/2020 16:13    Labs:  CBC: Recent Labs    01/04/20 1540 01/07/20 2257 01/08/20 0928 01/09/20 0613  WBC 6.8 8.9 8.1 8.7  HGB 9.1* 8.6* 9.0* 10.1*  HCT 29.6* 28.1* 28.9* 32.0*  PLT 191 242 233 246    COAGS: No results for input(s): INR, APTT in the last 8760 hours.  BMP: Recent Labs    05/08/19 1503 05/15/19 1049 06/03/19 1036 12/27/19 1108 01/04/20 1542 01/07/20 2257 01/08/20 0928 01/09/20 0613  NA 143   < > 140   < > 143 144 144 141  K  5.5*   < > 4.6   < > 4.2 4.5 4.5 4.5  CL 114*   < > 115*   < > 105 109 109 102  CO2 20*  --  18*   < > 22 21* 22 26  GLUCOSE 73   < > 124*   < > 122* 112* 87 96  BUN 77*   < > 55*   < > 52* 68* 69* 43*  CALCIUM 7.5*  --  7.1*   < > 7.1* 6.7* 6.5* 7.4*  CREATININE 3.40*   < > 3.26*   < > 4.59* 5.53* 5.62* 4.19*  GFRNONAA 18*  --  19*   < > 13* 11* 11* 15*  GFRAA 21*  --  22*  --  15*  --   --   --    < > = values in this interval not displayed.    LIVER FUNCTION TESTS: Recent Labs    12/27/19 1108 12/28/19 0524 12/29/19 0913 01/04/20 1542 01/08/20 0928  BILITOT 0.7 0.5  --  <0.2  --   AST 30 21  --  30  --   ALT 29 23  --  8  --   ALKPHOS 56 55  --  67  --   PROT 6.4* 5.5*  --  5.7*  --   ALBUMIN 3.4* 2.9* 2.9* 3.5* 3.0*     Assessment and Plan:   65 y.o male outpatient. Smoker. History of CAD, HTN, HLD, CHF, bladder cancer, ESRD on PD via placed on 11.8.21 with 3 cuffs. Abd X-ray from 2.3.22 shows no abnormality. Per Epic patient has a tunneled HD catheter that was placed by cardiovascular surgery and is scheduled to be removed on 2.15.21. Team is reporting malfunction with the existing PD catheter. Patient states that his able to instill but not able to drain the dialyzer. He describes it a "clog that got worse over the last week".  Patient presents for HPD catheter manipulation.   No recent labs. All medications are within acceptable parameters. NKDA. Patient has been NPO since midnight.  Risks and benefits discussed with the patient including bleeding, infection, damage to adjacent structures, bowel perforation/fistula connection, and sepsis.  All of the patient's questions were answered, patient is agreeable  to proceed. Consent signed and in chart.    Thank you for this interesting consult.  I greatly enjoyed meeting Josehua Hammar and look forward to participating in their care.  A copy of this report was sent to the requesting provider on this date.  Electronically  Signed: Jacqualine Mau, NP 04/07/2020, 1:03 PM   I spent a total of  30 Minutes   in face to face in clinical consultation, greater than 50% of which was counseling/coordinating care for PD catheter manipulation

## 2020-04-08 ENCOUNTER — Other Ambulatory Visit (INDEPENDENT_AMBULATORY_CARE_PROVIDER_SITE_OTHER): Payer: Self-pay | Admitting: Nurse Practitioner

## 2020-04-08 DIAGNOSIS — N186 End stage renal disease: Secondary | ICD-10-CM

## 2020-04-09 ENCOUNTER — Other Ambulatory Visit: Payer: Self-pay

## 2020-04-09 ENCOUNTER — Other Ambulatory Visit
Admission: RE | Admit: 2020-04-09 | Discharge: 2020-04-09 | Disposition: A | Discharge status: Home / Self Care | Payer: Medicare Other | Source: Ambulatory Visit | Attending: Surgery | Admitting: Surgery

## 2020-04-09 ENCOUNTER — Telehealth: Payer: Self-pay | Admitting: Surgery

## 2020-04-09 ENCOUNTER — Encounter: Payer: Self-pay | Admitting: Surgery

## 2020-04-09 ENCOUNTER — Ambulatory Visit (INDEPENDENT_AMBULATORY_CARE_PROVIDER_SITE_OTHER): Payer: Medicare Other | Admitting: Surgery

## 2020-04-09 VITALS — BP 172/105 | HR 68 | Temp 97.6°F | Ht 63.0 in | Wt 133.8 lb

## 2020-04-09 DIAGNOSIS — T85611A Breakdown (mechanical) of intraperitoneal dialysis catheter, initial encounter: Secondary | ICD-10-CM | POA: Diagnosis not present

## 2020-04-09 DIAGNOSIS — Z20822 Contact with and (suspected) exposure to covid-19: Secondary | ICD-10-CM | POA: Insufficient documentation

## 2020-04-09 DIAGNOSIS — Z01812 Encounter for preprocedural laboratory examination: Secondary | ICD-10-CM | POA: Diagnosis present

## 2020-04-09 NOTE — Telephone Encounter (Signed)
Patient has been advised of Pre-Admission date/time, COVID Testing date and Surgery date.  Surgery Date: 04/10/20 Preadmission Testing Date: 04/10/20 (phone patient informed to arrive 2 hours early (12:00) Covid Testing Date: 04/09/20 - patient advised to go to the [facility] ([place]) between 8a-1p  Patient has been made aware to arrive 2 hours early, at 12:00 day of his surgery 04/10/20 and voices understanding.  He has also been reminded not to eat or drink after midnight tonight.

## 2020-04-09 NOTE — H&P (View-Only) (Signed)
Outpatient Surgical Follow Up  04/09/2020  Anthony Hess is an 65 y.o. male.   Chief Complaint  Patient presents with  . Follow-up    PD catheter    HPI: Anthony Hess is a 65 year old male well-known to me with a history of ESRD s/p upper scopic PD placement over 3 months ago.  Over the last couple weeks he started having issues with his PD catheter.  They have tried TPA Heparin without success.  I have obtained an x-ray that have personally reviewed showing evidence of good position of the catheter.  There is no kinks.  The obstruction is an outflow problem I have also discussed with him in detail about options and he wanted to try manipulation by interventional radiologist.  I also reviewed personally the images.  The position remains the same report states that the flow is better however the patient reports that he still having significant issues and has not noticed any changes. He denies any fevers and chills.  He is currently dialyzed via permacath in the right IJ but is also giving him issues.   Past Medical History:  Diagnosis Date  . Bladder tumor   . Cancer [facility])    Bladder  . Chronic kidney disease    renal insufficiency  . Coronary artery disease   . History of kidney stones   . Hypertension   . Myocardial infarction ([place])   . Wears glasses     Past Surgical History:  Procedure Laterality Date  . CAPD INSERTION N/A 12/31/2019   Procedure: LAPAROSCOPIC INSERTION CONTINUOUS AMBULATORY PERITONEAL DIALYSIS  (CAPD) CATHETER;  Surgeon: Jules Husbands, MD;  Location: [facility] ORS;  Service: General;  Laterality: N/A;  . CORONARY ARTERY BYPASS GRAFT N/A 12/27/2018   Procedure: CORONARY ARTERY BYPASS GRAFTING (CABG) X 4 ON PUMP USING RIGHT & LEFT INTERNAL MAMMARY ARTERY LEFT RADIAL ARTERY ENDOSCOPICALLY HARVESTED;  Surgeon: [name], MD;  Location: [place];  Service: Open Heart Surgery;  Laterality: N/A;  . CYSTOSCOPY W/ RETROGRADES Bilateral 05/15/2019   Procedure: CYSTOSCOPY WITH  RETROGRADE PYELOGRAM;  Surgeon: Abbie Sons, MD;  Location: [facility] ORS;  Service: Urology;  Laterality: Bilateral;  . CYSTOSCOPY WITH BIOPSY N/A 05/15/2019   Procedure: CYSTOSCOPY WITH bladder BIOPSY;  Surgeon: Abbie Sons, MD;  Location: [facility] ORS;  Service: Urology;  Laterality: N/A;  . DIALYSIS/PERMA CATHETER INSERTION N/A 12/28/2019   Procedure: DIALYSIS/PERMA CATHETER INSERTION;  Surgeon: Algernon Huxley, MD;  Location: [place] CV LAB;  Service: Cardiovascular;  Laterality: N/A;  . IR IMAGE GUIDED DRAINAGE PERCUT CATH  PERITONEAL RETROPERIT  04/07/2020  . LEFT HEART CATH AND CORONARY ANGIOGRAPHY Left 12/20/2018   Procedure: LEFT HEART CATH AND CORONARY ANGIOGRAPHY;  Surgeon: Isaias Cowman, MD;  Location: [place] CV LAB;  Service: Cardiovascular;  Laterality: Left;  . RADIAL ARTERY HARVEST Left 12/27/2018   Procedure: ENDOSCOPIC RADIAL ARTERY HARVEST;  Surgeon: [name], MD;  Location: [place];  Service: Open Heart Surgery;  Laterality: Left;  . TEE WITHOUT CARDIOVERSION N/A 12/27/2018   Procedure: TRANSESOPHAGEAL ECHOCARDIOGRAM (TEE);  Surgeon: [name], MD;  Location: [place];  Service: Open Heart Surgery;  Laterality: N/A;  . TUMOR REMOVAL  2019   Bladder    Family History  Family history unknown: Yes    Social History:  reports that he quit smoking about 13 months ago. His smoking use included cigarettes. He smoked 0.25 packs per day. He has never used smokeless tobacco. He reports that he does not  drink alcohol and does not use drugs.  Allergies: No Known Allergies  Medications reviewed.    ROS Full ROS performed and is otherwise negative other than what is stated in HPI   BP (!) 172/105   Pulse 68   Temp 97.6 F (36.4 C) (Oral)   Ht 5\' 3"  (1.6 m)   Wt 133 lb 12.8 oz (60.7 kg)   SpO2 99%   BMI 23.70 kg/m   Physical Exam Vitals and nursing note reviewed. Exam conducted with a chaperone present.  Constitutional:      General: He is  not in acute distress.    Appearance: Normal appearance. He is normal weight. He is not ill-appearing.  Eyes:     General: No scleral icterus.       Right eye: No discharge.        Left eye: No discharge.  Cardiovascular:     Rate and Rhythm: Normal rate and regular rhythm.  Pulmonary:     Effort: Pulmonary effort is normal. No respiratory distress.     Breath sounds: Normal breath sounds. No stridor. No wheezing or rhonchi.  Abdominal:     General: Abdomen is flat. There is no distension.     Palpations: There is no mass.     Tenderness: There is no abdominal tenderness. There is no guarding.     Hernia: No hernia is present.     Comments: Peritoneal dialysis in place.  No evidence of infection.  Musculoskeletal:        General: Normal range of motion.     Cervical back: Normal range of motion and neck supple. No rigidity or tenderness.  Lymphadenopathy:     Cervical: No cervical adenopathy.  Skin:    General: Skin is warm and dry.     Capillary Refill: Capillary refill takes less than 2 seconds.     Coloration: Skin is not jaundiced.  Neurological:     General: No focal deficit present.     Mental Status: He is alert and oriented to person, place, and time.  Psychiatric:        Mood and Affect: Mood normal.        Behavior: Behavior normal.        Thought Content: Thought content normal.        Judgment: Judgment normal.        No results found for this or any previous visit (from the past 48 hour(s)). IR IMAGE GUIDED DRAINAGE PERCUT CATH  PERITONEAL RETROPERIT  Result Date: 04/07/2020 INDICATION: 65 year old gentleman with poorly functioning peritoneal dialysis catheter presents to interventional radiology for manipulation. EXAM: Peritoneal dialysis catheter manipulation MEDICATIONS: Ancef 1 g IV ANESTHESIA/SEDATION: None COMPLICATIONS: None immediate. PROCEDURE: Informed written consent was obtained from the patient after a thorough discussion of the procedural risks,  benefits and alternatives. All questions were addressed. Maximal Sterile Barrier Technique was utilized including caps, mask, sterile gowns, sterile gloves, sterile drape, hand hygiene and skin antiseptic. A timeout was performed prior to the initiation of the procedure. External segment of the peritoneal dialysis catheter and surrounding skin prepped and draped in usual fashion. The catheter was manipulated under fluoroscopy with stiff glide and superstiff Amplatz guidewires. Although the overall position of the pigtail did not change, there was improved drainage at the end the procedure. Drain flushed and capped. IMPRESSION: Fluoroscopic manipulation of peritoneal dialysis catheter as above. Electronically Signed   By: Miachel Roux M.D.   On: 04/07/2020 16:30    Assessment/Plan:  1. Peritoneal dialysis catheter dysfunction, initial encounter ([place]) Cussed with patient in detail given that he had chemical manipulation as well as manipulation under fluoroscopy and they have not work the next step is to perform revision of the catheter in the OR guided with laparoscopy.  Procedure discussed with patient detail.  Risks, benefits and possible revision including but not limited to: Bleeding, infection bowel injuries, catheter dysfunction.  He wants to get this done as soon as possible.  He also reports that he is permacath has a malfunction.  I have messaged Dr. Lucky Cowboy who was the operating surgeon in hopes that his team can revise the permacath in the same operative setting.  I do understand this is a last-minute request. Extensive counseling provided Greater than 50% of the 40 minutes  visit was spent in counseling/coordination of care   Caroleen Hamman, MD [place] Surgeon

## 2020-04-09 NOTE — Progress Notes (Signed)
Outpatient Surgical Follow Up  04/09/2020  Anthony Hess is an 65 y.o. male.   Chief Complaint  Patient presents with  . Follow-up    PD catheter    HPI: Anthony Hess is a 65 year old male well-known to me with a history of ESRD s/p upper scopic PD placement over 3 months ago.  Over the last couple weeks he started having issues with his PD catheter.  They have tried TPA Heparin without success.  I have obtained an x-ray that have personally reviewed showing evidence of good position of the catheter.  There is no kinks.  The obstruction is an outflow problem I have also discussed with him in detail about options and he wanted to try manipulation by interventional radiologist.  I also reviewed personally the images.  The position remains the same report states that the flow is better however the patient reports that he still having significant issues and has not noticed any changes. He denies any fevers and chills.  He is currently dialyzed via permacath in the right IJ but is also giving him issues.   Past Medical History:  Diagnosis Date  . Bladder tumor   . Cancer [facility])    Bladder  . Chronic kidney disease    renal insufficiency  . Coronary artery disease   . History of kidney stones   . Hypertension   . Myocardial infarction ([place])   . Wears glasses     Past Surgical History:  Procedure Laterality Date  . CAPD INSERTION N/A 12/31/2019   Procedure: LAPAROSCOPIC INSERTION CONTINUOUS AMBULATORY PERITONEAL DIALYSIS  (CAPD) CATHETER;  Surgeon: Jules Husbands, MD;  Location: [facility] ORS;  Service: General;  Laterality: N/A;  . CORONARY ARTERY BYPASS GRAFT N/A 12/27/2018   Procedure: CORONARY ARTERY BYPASS GRAFTING (CABG) X 4 ON PUMP USING RIGHT & LEFT INTERNAL MAMMARY ARTERY LEFT RADIAL ARTERY ENDOSCOPICALLY HARVESTED;  Surgeon: [name], MD;  Location: [place];  Service: Open Heart Surgery;  Laterality: N/A;  . CYSTOSCOPY W/ RETROGRADES Bilateral 05/15/2019   Procedure: CYSTOSCOPY WITH  RETROGRADE PYELOGRAM;  Surgeon: Abbie Sons, MD;  Location: [facility] ORS;  Service: Urology;  Laterality: Bilateral;  . CYSTOSCOPY WITH BIOPSY N/A 05/15/2019   Procedure: CYSTOSCOPY WITH bladder BIOPSY;  Surgeon: Abbie Sons, MD;  Location: [facility] ORS;  Service: Urology;  Laterality: N/A;  . DIALYSIS/PERMA CATHETER INSERTION N/A 12/28/2019   Procedure: DIALYSIS/PERMA CATHETER INSERTION;  Surgeon: Algernon Huxley, MD;  Location: [place] [facility];  Service: Cardiovascular;  Laterality: N/A;  . IR IMAGE GUIDED DRAINAGE PERCUT CATH  PERITONEAL RETROPERIT  04/07/2020  . LEFT HEART CATH AND CORONARY ANGIOGRAPHY Left 12/20/2018   Procedure: LEFT HEART CATH AND CORONARY ANGIOGRAPHY;  Surgeon: Isaias Cowman, MD;  Location: [place] CV [facility];  Service: Cardiovascular;  Laterality: Left;  . RADIAL ARTERY HARVEST Left 12/27/2018   Procedure: ENDOSCOPIC RADIAL ARTERY HARVEST;  Surgeon: [name], MD;  Location: [place];  Service: Open Heart Surgery;  Laterality: Left;  . TEE WITHOUT CARDIOVERSION N/A 12/27/2018   Procedure: TRANSESOPHAGEAL ECHOCARDIOGRAM (TEE);  Surgeon: [name], MD;  Location: [place];  Service: Open Heart Surgery;  Laterality: N/A;  . TUMOR REMOVAL  2019   Bladder    Family History  Family history unknown: Yes    Social History:  reports that he quit smoking about 13 months ago. His smoking use included cigarettes. He smoked 0.25 packs per day. He has never used smokeless tobacco. He reports that he does not  drink alcohol and does not use drugs.  Allergies: No Known Allergies  Medications reviewed.    ROS Full ROS performed and is otherwise negative other than what is stated in HPI   BP (!) 172/105   Pulse 68   Temp 97.6 F (36.4 C) (Oral)   Ht 5\' 3"  (1.6 m)   Wt 133 lb 12.8 oz (60.7 kg)   SpO2 99%   BMI 23.70 kg/m   Physical Exam Vitals and nursing note reviewed. Exam conducted with a chaperone present.  Constitutional:      General: He is  not in acute distress.    Appearance: Normal appearance. He is normal weight. He is not ill-appearing.  Eyes:     General: No scleral icterus.       Right eye: No discharge.        Left eye: No discharge.  Cardiovascular:     Rate and Rhythm: Normal rate and regular rhythm.  Pulmonary:     Effort: Pulmonary effort is normal. No respiratory distress.     Breath sounds: Normal breath sounds. No stridor. No wheezing or rhonchi.  Abdominal:     General: Abdomen is flat. There is no distension.     Palpations: There is no mass.     Tenderness: There is no abdominal tenderness. There is no guarding.     Hernia: No hernia is present.     Comments: Peritoneal dialysis in place.  No evidence of infection.  Musculoskeletal:        General: Normal range of motion.     Cervical back: Normal range of motion and neck supple. No rigidity or tenderness.  Lymphadenopathy:     Cervical: No cervical adenopathy.  Skin:    General: Skin is warm and dry.     Capillary Refill: Capillary refill takes less than 2 seconds.     Coloration: Skin is not jaundiced.  Neurological:     General: No focal deficit present.     Mental Status: He is alert and oriented to person, place, and time.  Psychiatric:        Mood and Affect: Mood normal.        Behavior: Behavior normal.        Thought Content: Thought content normal.        Judgment: Judgment normal.        No results found for this or any previous visit (from the past 48 hour(s)). IR IMAGE GUIDED DRAINAGE PERCUT CATH  PERITONEAL RETROPERIT  Result Date: 04/07/2020 INDICATION: 64 year old gentleman with poorly functioning peritoneal dialysis catheter presents to interventional radiology for manipulation. EXAM: Peritoneal dialysis catheter manipulation MEDICATIONS: Ancef 1 g IV ANESTHESIA/SEDATION: None COMPLICATIONS: None immediate. PROCEDURE: Informed written consent was obtained from the patient after a thorough discussion of the procedural risks,  benefits and alternatives. All questions were addressed. Maximal Sterile Barrier Technique was utilized including caps, mask, sterile gowns, sterile gloves, sterile drape, hand hygiene and skin antiseptic. A timeout was performed prior to the initiation of the procedure. External segment of the peritoneal dialysis catheter and surrounding skin prepped and draped in usual fashion. The catheter was manipulated under fluoroscopy with stiff glide and superstiff Amplatz guidewires. Although the overall position of the pigtail did not change, there was improved drainage at the end the procedure. Drain flushed and capped. IMPRESSION: Fluoroscopic manipulation of peritoneal dialysis catheter as above. Electronically Signed   By: Miachel Roux M.D.   On: 04/07/2020 16:30    Assessment/Plan:  1. Peritoneal dialysis catheter dysfunction, initial encounter ([place]) Cussed with patient in detail given that he had chemical manipulation as well as manipulation under fluoroscopy and they have not work the next step is to perform revision of the catheter in the OR guided with laparoscopy.  Procedure discussed with patient detail.  Risks, benefits and possible revision including but not limited to: Bleeding, infection bowel injuries, catheter dysfunction.  He wants to get this done as soon as possible.  He also reports that he is permacath has a malfunction.  I have messaged Dr. Lucky Cowboy who was the operating surgeon in hopes that his team can revise the permacath in the same operative setting.  I do understand this is a last-minute request. Extensive counseling provided Greater than 50% of the 40 minutes  visit was spent in counseling/coordination of care   Caroleen Hamman, MD [place] Surgeon

## 2020-04-09 NOTE — Patient Instructions (Signed)
We will get you scheduled for surgery on 04/10/2020 and you will need to get covid tested today 04/09/2020 at [facility]. If you have any concerns or questions, please feel free to call our office.    Peritoneal Dialysis Catheter Placement, Care After The following information offers guidance on how to care for yourself after your procedure. Your health care provider may also give you more specific instructions. If you have problems or questions, contact your health care provider. What can I expect after the procedure? After the procedure, it is common to have some pain or discomfort in your abdomen and your incision area. You may need to wait 2 weeks after your procedure before you can start peritoneal dialysis treatment. If you need dialysis before that time, your health care provider may begin peritoneal dialysis treatment early or offer kidney dialysis treatments (hemodialysis) until you heal. Follow these instructions at home: Incision care  Follow instructions from your health care provider about how to take care of your incision or incisions. Make sure you: ? Wash your hands with soap and water for at least 20 seconds before and after you change your bandage (dressing). If soap and water are not available, use hand sanitizer. ? Change your dressing only as told by your health care provider. Your health care provider may tell you not to touch or change your dressing. ? Leave stitches (sutures), staples, skin glue, or adhesive strips in place. These skin closures may need to stay in place for 2 weeks or longer. If adhesive strip edges start to loosen and curl up, you may trim the loose edges. Do not remove adhesive strips completely unless your health care provider tells you to do that.  Check your incision areas every day for signs of infection. If you were instructed not to touch or change your dressing, look at your dressing for signs of infection. Check  for: ? Redness, swelling, or more pain. ? Fluid or blood. ? Warmth. ? Pus or a bad smell.   Medicines  Take over-the-counter and prescription medicines only as told by your health care provider.  If you were prescribed an antibiotic medicine, use it as told by your health care provider. Do not stop using the antibiotic even if you start to feel better.  Ask your health care provider if the medicine prescribed to you requires you to avoid driving or using machinery. Driving  Do not drive or ride in a car until your health care provider approves. Your seat belt could move the catheter out of position or cause irritation by rubbing on your incision. Activity  Rest and limit your activity.  Do not lift anything that is heavier than 10 lb (4.5 kg), or the limit that you are told, until your health care provider says that it is safe.  Return to your normal activities as told by your health care provider. Ask your health care provider what activities are safe for you.   Managing constipation Your condition may cause constipation. To prevent or treat constipation, you may need to:  Drink enough fluid to keep your urine pale yellow.  Take over-the-counter or prescription medicines.  Eat foods that are high in fiber, such as beans, whole grains, and fresh fruits and vegetables.  Limit foods that are high in fat and processed sugars, such as fried or sweet foods. General instructions  Do not use any products that contain nicotine or tobacco. These products include cigarettes, chewing tobacco, and  vaping devices, such as e-cigarettes. If you need help quitting, ask your health care provider.  Follow instructions from your health care provider about eating or drinking restrictions.  Do not take baths, swim, or use a hot tub until your health care provider approves. Ask your health care provider if you may take showers. You may only be allowed to take sponge baths.  Wear loose-fitting  clothing that keeps the catheter covered so that it cannot get caught on something.  Keep your catheter clean and dry.  Keep all follow-up visits. This is important. Contact a health care provider if:  You have a fever or chills.  You have warmth, redness, swelling, or more pain around an incision.  You have fluid or blood coming from an incision.  You have pus or a bad smell coming from an incision.  You cannot eat or drink without vomiting. Get help right away if:  You have problems breathing.  You are confused.  You have trouble speaking.  You have severe pain in your abdomen that does not get better with treatment.  You have bright red blood in your stool (feces), or your stool is dark black and looks like tar. These symptoms may represent a serious problem that is an emergency. Do not wait to see if the symptoms will go away. Get medical help right away. Call your local emergency services (911 in the [place] Do not drive yourself to the hospital. Summary  After the procedure, it is common to have some pain or discomfort in your abdomen, your incision area, or both.  You may have to wait 2 weeks after your procedure before you can start peritoneal dialysis treatment.  Check your incision area every day for signs of infection.  Get medical help right away if you have severe pain in your abdomen that does not get better with treatment. This information is not intended to replace advice given to you by your health care provider. Make sure you discuss any questions you have with your health care provider. Document Revised: 09/27/2019 Document Reviewed: 09/27/2019 Elsevier Patient Education  2021 [employer].

## 2020-04-09 NOTE — Telephone Encounter (Signed)
Patient was scheduled for a permcath removal on 04/15/20 with Dr. Delana Meyer and after a fax requesting a permcath exchange he has been rescheduled for 04/14/20 with Dr. Lucky Cowboy with a 11:45 am arrival time to the [facility]. Covid testing on 04/10/20 between 8-1 pm. Pre-procedure instructions will be faxed to [facility] attention [place].

## 2020-04-10 ENCOUNTER — Ambulatory Visit: Payer: Medicare Other

## 2020-04-10 ENCOUNTER — Ambulatory Visit
Admission: RE | Admit: 2020-04-10 | Discharge: 2020-04-10 | Disposition: A | Discharge status: Home / Self Care | Payer: Medicare Other | Attending: Surgery | Admitting: Surgery

## 2020-04-10 ENCOUNTER — Other Ambulatory Visit: Payer: Self-pay

## 2020-04-10 ENCOUNTER — Ambulatory Visit: Payer: Medicare Other | Admitting: Certified Registered Nurse Anesthetist

## 2020-04-10 ENCOUNTER — Encounter: Payer: Self-pay | Admitting: Surgery

## 2020-04-10 ENCOUNTER — Encounter
Admission: RE | Disposition: A | Discharge status: Home / Self Care | Payer: Self-pay | Source: Home / Self Care | Attending: Surgery

## 2020-04-10 DIAGNOSIS — E785 Hyperlipidemia, unspecified: Secondary | ICD-10-CM | POA: Insufficient documentation

## 2020-04-10 DIAGNOSIS — Z79899 Other long term (current) drug therapy: Secondary | ICD-10-CM | POA: Insufficient documentation

## 2020-04-10 DIAGNOSIS — Z951 Presence of aortocoronary bypass graft: Secondary | ICD-10-CM | POA: Diagnosis not present

## 2020-04-10 DIAGNOSIS — Z87442 Personal history of urinary calculi: Secondary | ICD-10-CM | POA: Diagnosis not present

## 2020-04-10 DIAGNOSIS — I2510 Atherosclerotic heart disease of native coronary artery without angina pectoris: Secondary | ICD-10-CM | POA: Diagnosis not present

## 2020-04-10 DIAGNOSIS — F17210 Nicotine dependence, cigarettes, uncomplicated: Secondary | ICD-10-CM | POA: Diagnosis not present

## 2020-04-10 DIAGNOSIS — Y831 Surgical operation with implant of artificial internal device as the cause of abnormal reaction of the patient, or of later complication, without mention of misadventure at the time of the procedure: Secondary | ICD-10-CM | POA: Insufficient documentation

## 2020-04-10 DIAGNOSIS — Z992 Dependence on renal dialysis: Secondary | ICD-10-CM | POA: Insufficient documentation

## 2020-04-10 DIAGNOSIS — I252 Old myocardial infarction: Secondary | ICD-10-CM | POA: Insufficient documentation

## 2020-04-10 DIAGNOSIS — Z7982 Long term (current) use of aspirin: Secondary | ICD-10-CM | POA: Diagnosis not present

## 2020-04-10 DIAGNOSIS — T85611A Breakdown (mechanical) of intraperitoneal dialysis catheter, initial encounter: Secondary | ICD-10-CM

## 2020-04-10 DIAGNOSIS — I132 Hypertensive heart and chronic kidney disease with heart failure and with stage 5 chronic kidney disease, or end stage renal disease: Secondary | ICD-10-CM | POA: Diagnosis not present

## 2020-04-10 DIAGNOSIS — T85691A Other mechanical complication of intraperitoneal dialysis catheter, initial encounter: Secondary | ICD-10-CM | POA: Insufficient documentation

## 2020-04-10 DIAGNOSIS — Z8551 Personal history of malignant neoplasm of bladder: Secondary | ICD-10-CM | POA: Diagnosis not present

## 2020-04-10 DIAGNOSIS — N185 Chronic kidney disease, stage 5: Secondary | ICD-10-CM | POA: Diagnosis not present

## 2020-04-10 DIAGNOSIS — N186 End stage renal disease: Secondary | ICD-10-CM | POA: Insufficient documentation

## 2020-04-10 DIAGNOSIS — I509 Heart failure, unspecified: Secondary | ICD-10-CM | POA: Insufficient documentation

## 2020-04-10 HISTORY — PX: EXCHANGE OF A DIALYSIS CATHETER: SHX5818

## 2020-04-10 HISTORY — PX: CAPD REMOVAL: SHX5234

## 2020-04-10 LAB — POCT I-STAT, CHEM 8
BUN: 50 mg/dL — ABNORMAL HIGH (ref 8–23)
Calcium, Ion: 0.84 mmol/L — CL (ref 1.15–1.40)
Chloride: 116 mmol/L — ABNORMAL HIGH (ref 98–111)
Creatinine, Ser: 6.5 mg/dL — ABNORMAL HIGH (ref 0.61–1.24)
Glucose, Bld: 82 mg/dL (ref 70–99)
HCT: 32 % — ABNORMAL LOW (ref 39.0–52.0)
Hemoglobin: 10.9 g/dL — ABNORMAL LOW (ref 13.0–17.0)
Potassium: 4.4 mmol/L (ref 3.5–5.1)
Sodium: 145 mmol/L (ref 135–145)
TCO2: 19 mmol/L — ABNORMAL LOW (ref 22–32)

## 2020-04-10 LAB — SARS CORONAVIRUS 2 (TAT 6-24 HRS): SARS Coronavirus 2: NEGATIVE

## 2020-04-10 SURGERY — LAPAROSCOPIC REMOVAL CONTINUOUS AMBULATORY PERITONEAL DIALYSIS  (CAPD) CATHETER
Anesthesia: General | Site: Neck | Laterality: Right

## 2020-04-10 MED ORDER — CHLORHEXIDINE GLUCONATE 0.12 % MT SOLN: 15.0000 mL | Freq: Once | OROMUCOSAL | Status: AC

## 2020-04-10 MED ORDER — HEPARIN SODIUM (PORCINE) 10000 UNIT/ML IJ SOLN
INTRAMUSCULAR | Status: DC | PRN
  Administered 2020-04-10: 10000 [IU] via SUBCUTANEOUS

## 2020-04-10 MED ORDER — HEPARIN SODIUM (PORCINE) 5000 UNIT/ML IJ SOLN
INTRAMUSCULAR | Status: AC
  Administered 2020-04-10: 5000 [IU] via SUBCUTANEOUS
  Filled 2020-04-10: qty 1

## 2020-04-10 MED ORDER — ACETAMINOPHEN 500 MG PO TABS: 1000.0000 mg | ORAL_TABLET | ORAL | Status: AC

## 2020-04-10 MED ORDER — ONDANSETRON HCL 4 MG/2ML IJ SOLN
INTRAMUSCULAR | Status: DC | PRN
  Administered 2020-04-10: 4 mg via INTRAVENOUS

## 2020-04-10 MED ORDER — FENTANYL CITRATE (PF) 100 MCG/2ML IJ SOLN
INTRAMUSCULAR | Status: DC | PRN
  Administered 2020-04-10: 50 ug via INTRAVENOUS
  Administered 2020-04-10 (×2): 25 ug via INTRAVENOUS
  Administered 2020-04-10: 50 ug via INTRAVENOUS

## 2020-04-10 MED ORDER — SUGAMMADEX SODIUM 200 MG/2ML IV SOLN
INTRAVENOUS | Status: DC | PRN
  Administered 2020-04-10: 250 mg via INTRAVENOUS

## 2020-04-10 MED ORDER — CHLORHEXIDINE GLUCONATE CLOTH 2 % EX PADS: 6.0000 | MEDICATED_PAD | Freq: Once | CUTANEOUS | Status: DC

## 2020-04-10 MED ORDER — CEFAZOLIN SODIUM-DEXTROSE 2-4 GM/100ML-% IV SOLN
2.0000 g | INTRAVENOUS | Status: AC
  Administered 2020-04-10: 2 g via INTRAVENOUS

## 2020-04-10 MED ORDER — HEPARIN SODIUM (PORCINE) 5000 UNIT/ML IJ SOLN: 5000.0000 [IU] | Freq: Once | INTRAMUSCULAR | Status: AC

## 2020-04-10 MED ORDER — FENTANYL CITRATE (PF) 100 MCG/2ML IJ SOLN
INTRAMUSCULAR | Status: AC
  Filled 2020-04-10: qty 2

## 2020-04-10 MED ORDER — ROCURONIUM BROMIDE 10 MG/ML (PF) SYRINGE
PREFILLED_SYRINGE | INTRAVENOUS | Status: AC
  Filled 2020-04-10: qty 10

## 2020-04-10 MED ORDER — ESMOLOL HCL 100 MG/10ML IV SOLN
INTRAVENOUS | Status: AC
  Filled 2020-04-10: qty 10

## 2020-04-10 MED ORDER — BUPIVACAINE-EPINEPHRINE (PF) 0.25% -1:200000 IJ SOLN
INTRAMUSCULAR | Status: AC
  Filled 2020-04-10: qty 30

## 2020-04-10 MED ORDER — PROPOFOL 10 MG/ML IV BOLUS
INTRAVENOUS | Status: AC
  Filled 2020-04-10: qty 20

## 2020-04-10 MED ORDER — BUPIVACAINE LIPOSOME 1.3 % IJ SUSP: 20.0000 mL | Freq: Once | INTRAMUSCULAR | Status: DC

## 2020-04-10 MED ORDER — DEXAMETHASONE SODIUM PHOSPHATE 10 MG/ML IJ SOLN
INTRAMUSCULAR | Status: AC
  Filled 2020-04-10: qty 1

## 2020-04-10 MED ORDER — ONDANSETRON HCL 4 MG/2ML IJ SOLN
INTRAMUSCULAR | Status: AC
  Filled 2020-04-10: qty 2

## 2020-04-10 MED ORDER — ROCURONIUM BROMIDE 10 MG/ML (PF) SYRINGE
PREFILLED_SYRINGE | INTRAVENOUS | Status: AC
  Filled 2020-04-10: qty 30

## 2020-04-10 MED ORDER — ONDANSETRON HCL 4 MG/2ML IJ SOLN: 4.0000 mg | Freq: Once | INTRAMUSCULAR | Status: DC | PRN

## 2020-04-10 MED ORDER — BUPIVACAINE LIPOSOME 1.3 % IJ SUSP
INTRAMUSCULAR | Status: AC
  Filled 2020-04-10: qty 20

## 2020-04-10 MED ORDER — GLYCOPYRROLATE 0.2 MG/ML IJ SOLN
INTRAMUSCULAR | Status: DC | PRN
  Administered 2020-04-10: .2 mg via INTRAVENOUS

## 2020-04-10 MED ORDER — EPHEDRINE SULFATE 50 MG/ML IJ SOLN
INTRAMUSCULAR | Status: DC | PRN
  Administered 2020-04-10: 10 mg via INTRAVENOUS
  Administered 2020-04-10 (×2): 5 mg via INTRAVENOUS

## 2020-04-10 MED ORDER — ORAL CARE MOUTH RINSE: 15.0000 mL | Freq: Once | OROMUCOSAL | Status: AC

## 2020-04-10 MED ORDER — ESMOLOL HCL 100 MG/10ML IV SOLN
INTRAVENOUS | Status: DC | PRN
  Administered 2020-04-10: 10 mg via INTRAVENOUS

## 2020-04-10 MED ORDER — KETAMINE HCL 50 MG/5ML IJ SOSY
PREFILLED_SYRINGE | INTRAMUSCULAR | Status: AC
  Filled 2020-04-10: qty 5

## 2020-04-10 MED ORDER — BUPIVACAINE LIPOSOME 1.3 % IJ SUSP
INTRAMUSCULAR | Status: DC | PRN
  Administered 2020-04-10: 20 mL

## 2020-04-10 MED ORDER — DEXAMETHASONE SODIUM PHOSPHATE 10 MG/ML IJ SOLN
INTRAMUSCULAR | Status: DC | PRN
  Administered 2020-04-10: 5 mg via INTRAVENOUS

## 2020-04-10 MED ORDER — HEPARIN SODIUM (PORCINE) 10000 UNIT/ML IJ SOLN
INTRAMUSCULAR | Status: AC
  Filled 2020-04-10: qty 1

## 2020-04-10 MED ORDER — LABETALOL HCL 5 MG/ML IV SOLN
INTRAVENOUS | Status: DC | PRN
  Administered 2020-04-10: 5 mg via INTRAVENOUS

## 2020-04-10 MED ORDER — EPHEDRINE 5 MG/ML INJ
INTRAVENOUS | Status: AC
  Filled 2020-04-10: qty 10

## 2020-04-10 MED ORDER — HYDROCODONE-ACETAMINOPHEN 5-325 MG PO TABS: 1.0000 | ORAL_TABLET | Freq: Four times a day (QID) | ORAL | 0 refills | Status: DC | PRN

## 2020-04-10 MED ORDER — SODIUM CHLORIDE 0.9 % IV SOLN: INTRAVENOUS | Status: DC

## 2020-04-10 MED ORDER — BUPIVACAINE-EPINEPHRINE 0.25% -1:200000 IJ SOLN
INTRAMUSCULAR | Status: DC | PRN
  Administered 2020-04-10: 30 mL

## 2020-04-10 MED ORDER — CHLORHEXIDINE GLUCONATE 0.12 % MT SOLN
OROMUCOSAL | Status: AC
  Administered 2020-04-10: 15 mL via OROMUCOSAL
  Filled 2020-04-10: qty 15

## 2020-04-10 MED ORDER — SUCCINYLCHOLINE CHLORIDE 200 MG/10ML IV SOSY
PREFILLED_SYRINGE | INTRAVENOUS | Status: AC
  Filled 2020-04-10: qty 10

## 2020-04-10 MED ORDER — MIDAZOLAM HCL 2 MG/2ML IJ SOLN
INTRAMUSCULAR | Status: AC
  Filled 2020-04-10: qty 2

## 2020-04-10 MED ORDER — LIDOCAINE HCL (PF) 2 % IJ SOLN
INTRAMUSCULAR | Status: AC
  Filled 2020-04-10: qty 5

## 2020-04-10 MED ORDER — ROCURONIUM BROMIDE 100 MG/10ML IV SOLN
INTRAVENOUS | Status: DC | PRN
  Administered 2020-04-10: 50 mg via INTRAVENOUS

## 2020-04-10 MED ORDER — PROPOFOL 10 MG/ML IV BOLUS
INTRAVENOUS | Status: DC | PRN
  Administered 2020-04-10: 150 mg via INTRAVENOUS

## 2020-04-10 MED ORDER — GABAPENTIN 300 MG PO CAPS: 300.0000 mg | ORAL_CAPSULE | ORAL | Status: AC

## 2020-04-10 MED ORDER — ACETAMINOPHEN 500 MG PO TABS
ORAL_TABLET | ORAL | Status: AC
  Administered 2020-04-10: 1000 mg via ORAL
  Filled 2020-04-10: qty 2

## 2020-04-10 MED ORDER — FENTANYL CITRATE (PF) 100 MCG/2ML IJ SOLN: 25.0000 ug | INTRAMUSCULAR | Status: DC | PRN

## 2020-04-10 MED ORDER — CEFAZOLIN SODIUM-DEXTROSE 2-4 GM/100ML-% IV SOLN
INTRAVENOUS | Status: AC
  Filled 2020-04-10: qty 100

## 2020-04-10 MED ORDER — GABAPENTIN 300 MG PO CAPS
ORAL_CAPSULE | ORAL | Status: AC
  Administered 2020-04-10: 300 mg via ORAL
  Filled 2020-04-10: qty 1

## 2020-04-10 MED ORDER — LIDOCAINE HCL (CARDIAC) PF 100 MG/5ML IV SOSY
PREFILLED_SYRINGE | INTRAVENOUS | Status: DC | PRN
  Administered 2020-04-10: 80 mg via INTRAVENOUS

## 2020-04-10 SURGICAL SUPPLY — 52 items
ADAPTER CATH DIALYSIS 4X8 IT L (MISCELLANEOUS) ×3 IMPLANT
ADH SKN CLS APL DERMABOND .7 (GAUZE/BANDAGES/DRESSINGS)
APL PRP STRL LF DISP 70% ISPRP (MISCELLANEOUS) ×6
BIOPATCH WHT 1IN DISK W/4.0 H (GAUZE/BANDAGES/DRESSINGS) ×3 IMPLANT
BLADE CLIPPER SURG (BLADE) IMPLANT
CATH EXTENDED DIALYSIS (CATHETERS) ×3 IMPLANT
CHLORAPREP W/TINT 26 (MISCELLANEOUS) ×9 IMPLANT
COVER WAND RF STERILE (DRAPES) ×3 IMPLANT
DEFOGGER SCOPE WARMER CLEARIFY (MISCELLANEOUS) ×3 IMPLANT
DERMABOND ADVANCED (GAUZE/BANDAGES/DRESSINGS)
DERMABOND ADVANCED .7 DNX12 (GAUZE/BANDAGES/DRESSINGS) IMPLANT
ELECT CAUTERY BLADE 6.4 (BLADE) ×3 IMPLANT
ELECT REM PT RETURN 9FT ADLT (ELECTROSURGICAL) ×3
ELECTRODE REM PT RTRN 9FT ADLT (ELECTROSURGICAL) ×2 IMPLANT
GLOVE SURG ENC MOIS LTX SZ7 (GLOVE) ×3 IMPLANT
GOWN STRL REUS W/ TWL LRG LVL3 (GOWN DISPOSABLE) ×4 IMPLANT
GOWN STRL REUS W/TWL LRG LVL3 (GOWN DISPOSABLE) ×6
GRASPER SUT TROCAR 14GX15 (MISCELLANEOUS) ×3 IMPLANT
IV NS 1000ML (IV SOLUTION) ×3
IV NS 1000ML BAXH (IV SOLUTION) ×2 IMPLANT
KIT TURNOVER KIT A (KITS) ×3 IMPLANT
MANIFOLD NEPTUNE II (INSTRUMENTS) ×3 IMPLANT
MINICAP W/POVIDONE IODINE SOL (MISCELLANEOUS) ×3 IMPLANT
NDL SAFETY ECLIPSE 18X1.5 (NEEDLE) ×2 IMPLANT
NEEDLE HYPO 18GX1.5 SHARP (NEEDLE) ×3
NEEDLE HYPO 22GX1.5 SAFETY (NEEDLE) ×3 IMPLANT
NEEDLE INSUFFLATION 14GA 120MM (NEEDLE) ×3 IMPLANT
NS IRRIG 500ML POUR BTL (IV SOLUTION) ×3 IMPLANT
PACK LAP CHOLECYSTECTOMY (MISCELLANEOUS) ×3 IMPLANT
PENCIL ELECTRO HAND CTR (MISCELLANEOUS) ×3 IMPLANT
SET CYSTO W/LG BORE CLAMP LF (SET/KITS/TRAYS/PACK) ×3 IMPLANT
SET TRANSFER 6 W/TWIST CLAMP 5 (SET/KITS/TRAYS/PACK) ×3 IMPLANT
SET TUBE SMOKE EVAC HIGH FLOW (TUBING) ×3 IMPLANT
SLEEVE ADV FIXATION 5X100MM (TROCAR) ×3 IMPLANT
SPONGE DRAIN TRACH 4X4 STRL 2S (GAUZE/BANDAGES/DRESSINGS) ×3 IMPLANT
SPONGE LAP 18X18 RF (DISPOSABLE) ×3 IMPLANT
STYLET FALLER (MISCELLANEOUS) ×3 IMPLANT
STYLET FALLER MEDIONICS (MISCELLANEOUS) ×3 IMPLANT
SUT ETHILON 3-0 FS-10 30 BLK (SUTURE) ×3
SUT MNCRL 4-0 (SUTURE) ×3
SUT MNCRL 4-0 27XMFL (SUTURE) ×2
SUT VIC AB 3-0 SH 27 (SUTURE) ×3
SUT VIC AB 3-0 SH 27X BRD (SUTURE) ×2 IMPLANT
SUT VICRYL 0 AB UR-6 (SUTURE) ×6 IMPLANT
SUTURE EHLN 3-0 FS-10 30 BLK (SUTURE) ×2 IMPLANT
SUTURE MNCRL 4-0 27XMF (SUTURE) ×2 IMPLANT
SYR 20ML LL LF (SYRINGE) ×3 IMPLANT
SYR 3ML LL SCALE MARK (SYRINGE) ×3 IMPLANT
SYS KII FIOS ACCESS ABD 5X100 (TROCAR) ×3
SYSTEM KII FIOS ACES ABD 5X100 (TROCAR) ×2 IMPLANT
TROCAR BALLN GELPORT 12X130M (ENDOMECHANICALS) ×3 IMPLANT
TROCAR XCEL NON-BLD 5MMX100MML (ENDOMECHANICALS) ×6 IMPLANT

## 2020-04-10 NOTE — Op Note (Signed)
OPERATIVE NOTE    PRE-OPERATIVE DIAGNOSIS: 1. ESRD 2. Non-functional permcath  POST-OPERATIVE DIAGNOSIS: same as above  PROCEDURE: 1. Fluoroscopic guidance for placement of catheter 2. Placement of a 19 cm tip to cuff tunneled hemodialysis catheter via the right internal jugular vein and removal of previous catheter  SURGEON: [name], MD  ANESTHESIA: general  ESTIMATED BLOOD LOSS: 5 cc  FINDING(S): none  SPECIMEN(S):  None  INDICATIONS:   Patient is a 65 y.o.male who presents with non-functional dialysis catheter and ESRD.  The patient needs long term dialysis access for their ESRD, and a Permcath is necessary.  Risks and benefits are discussed and informed consent is obtained.    DESCRIPTION: After obtaining full informed written consent, the patient was brought back to the OR. The patient's existing catheter, right neck and chest were sterilely prepped and draped in a sterile surgical field was created.  The existing catheter was dissected free from the fibrous sheath securing the cuff with hemostats and blunt dissection.  A wire was placed. The existing catheter was then removed and the wire used to keep venous access. I selected a 19 cm tip to cuff tunneled dialysis catheter.  Using fluoroscopic guidance the catheter tips were parked in the right atrium. The appropriate distal connectors were placed. It withdrew blood well and flushed easily with heparinized saline and a concentrated heparin solution was then placed. It was secured to the chest wall with 2 Prolene sutures. A 4-0 Monocryl pursestring suture was placed around the exit site. Sterile dressings were placed. The patient tolerated the procedure well and was taken to the recovery room in stable condition.  COMPLICATIONS: None  CONDITION: Stable  [name] 04/10/2020 2:21 PM   This note was created with Dragon Medical transcription system. Any errors in dictation are purely unintentional.

## 2020-04-10 NOTE — Interval H&P Note (Signed)
History and Physical Interval Note:  04/10/2020 1:54 PM  Anthony Hess  has presented today for surgery, with the diagnosis of ESRD.  The various methods of treatment have been discussed with the patient and family. After consideration of risks, benefits and other options for treatment, the patient has consented to  Procedure(s): LAPAROSCOPIC REMOVAL CONTINUOUS AMBULATORY PERITONEAL DIALYSIS  (CAPD) CATHETER (N/A) as a surgical intervention.  The patient's history has been reviewed, patient examined, no change in status, stable for surgery.  I have reviewed the patient's chart and labs.  Questions were answered to the patient's satisfaction.     [place]

## 2020-04-10 NOTE — Interval H&P Note (Signed)
History and Physical Interval Note:  04/10/2020 1:50 PM  Anthony Hess  has presented today for surgery, with the diagnosis of ESRD.  The various methods of treatment have been discussed with the patient and family. After consideration of risks, benefits and other options for treatment, the patient has consented to  Procedure(s): LAPAROSCOPIC REMOVAL CONTINUOUS AMBULATORY PERITONEAL DIALYSIS  (CAPD) CATHETER (N/A) as a surgical intervention.  The patient's history has been reviewed, patient examined, no change in status, stable for surgery.  I have reviewed the patient's chart and labs.  Questions were answered to the patient's satisfaction.     [place]

## 2020-04-10 NOTE — Anesthesia Preprocedure Evaluation (Signed)
Anesthesia Evaluation  Patient identified by MRN, date of birth, ID band Patient awake    Reviewed: Allergy & Precautions, H&P , NPO status , Patient's Chart, lab work & pertinent test results  History of Anesthesia Complications Negative for: history of anesthetic complications  Airway Mallampati: III  TM Distance: >3 FB Neck ROM: limited    Dental  (+) Chipped, Poor Dentition, Missing   Pulmonary shortness of breath and with exertion, COPD, neg recent URI, Current Smoker, former smoker,    + rhonchi  + decreased breath sounds      Cardiovascular Exercise Tolerance: Good hypertension, Pt. on medications (-) angina+ CAD, + Past MI, + CABG, + Peripheral Vascular Disease and +CHF  (-) Cardiac Stents Normal cardiovascular exam(-) dysrhythmias + Valvular Problems/Murmurs MR and AI  - Systolic murmurs TTE 85/4/62: 1. Left ventricular ejection fraction, by estimation, is 20 to 25%. The  left ventricle has severely decreased function. The left ventricle has no  regional wall motion abnormalities. The left ventricular internal cavity  size was moderately dilated. Left  ventricular diastolic parameters are consistent with Grade I diastolic  dysfunction (impaired relaxation).  2. Right ventricular systolic function is normal. The right ventricular  size is normal.  3. The mitral valve is normal in structure. Moderate mitral valve  regurgitation. No evidence of mitral stenosis.  4. The aortic valve is normal in structure. Aortic valve regurgitation is  mild to moderate. No aortic stenosis is present.  5. The inferior vena cava is normal in size with greater than 50%  respiratory variability, suggesting right atrial pressure of 3 mmHg.    Neuro/Psych negative neurological ROS  negative psych ROS   GI/Hepatic negative GI ROS, Neg liver ROS, neg GERD  ,  Endo/Other  negative endocrine ROS  Renal/GU CRF and DialysisRenal disease      Musculoskeletal   Abdominal (+) - obese,   Peds  Hematology negative hematology ROS (+)   Anesthesia Other Findings Past Medical History: No date: Bladder tumor No date: Cancer [facility])     Comment:  Bladder No date: Chronic kidney disease     Comment:  renal insufficiency No date: Coronary artery disease No date: History of kidney stones No date: Hypertension No date: Myocardial infarction ([place]) No date: Wears glasses   Reproductive/Obstetrics                             Anesthesia Physical  Anesthesia Plan  ASA: IV  Anesthesia Plan: General   Post-op Pain Management:    Induction: Intravenous  PONV Risk Score and Plan: Ondansetron, Dexamethasone, Midazolam and Treatment may vary due to age or medical condition  Airway Management Planned: Oral ETT  Additional Equipment:   Intra-op Plan:   Post-operative Plan: Extubation in OR and Possible Post-op intubation/ventilation  Informed Consent: I have reviewed the patients History and Physical, chart, labs and discussed the procedure including the risks, benefits and alternatives for the proposed anesthesia with the patient or authorized representative who has indicated his/her understanding and acceptance.     Dental Advisory Given  Plan Discussed with: Anesthesiologist, CRNA and Surgeon  Anesthesia Plan Comments: (Patient informed that they are higher risk for complications from anesthesia during this procedure due to their medical history.  Patient voiced understanding.  Patient consented for risks of anesthesia including but not limited to:  - adverse reactions to medications - damage to eyes, teeth, lips or other oral mucosa - nerve damage  due to positioning  - sore throat or hoarseness - Damage to heart, brain, nerves, lungs, other parts of body or loss of life  Patient voiced understanding.)        Anesthesia Quick Evaluation

## 2020-04-10 NOTE — Discharge Instructions (Addendum)

## 2020-04-10 NOTE — Anesthesia Postprocedure Evaluation (Signed)
Anesthesia Post Note  Patient: Jeneen Rinks Christy  Procedure(s) Performed: LAPAROSCOPIC REVISION OF CONTINUOUS AMBULATORY PERITONEAL DIALYSIS  (CAPD) CATHETER (N/A ) EXCHANGE OF A DIALYSIS CATHETER (Right Neck)  Patient location during evaluation: PACU Anesthesia Type: General Level of consciousness: awake and alert Pain management: pain level controlled Vital Signs Assessment: post-procedure vital signs reviewed and stable Respiratory status: spontaneous breathing, nonlabored ventilation, respiratory function stable and patient connected to nasal cannula oxygen Cardiovascular status: blood pressure returned to baseline and stable Postop Assessment: no apparent nausea or vomiting Anesthetic complications: no   No complications documented.   Last Vitals:  Vitals:   04/10/20 1700 04/10/20 1712  BP: (!) 144/103 (!) 159/93  Pulse: 73 69  Resp: 15 16  Temp: (!) 36.4 C (!) 36.4 C  SpO2: 92% 94%    Last Pain:  Vitals:   04/10/20 1712  TempSrc: Temporal  PainSc: 0-No pain                 Martha Clan

## 2020-04-10 NOTE — Interval H&P Note (Signed)
History and Physical Interval Note:  04/10/2020 1:26 PM  Anthony Hess  has presented today for surgery, with the diagnosis of ESRD.  The various methods of treatment have been discussed with the patient and family. After consideration of risks, benefits and other options for treatment, the patient has consented to  permcath exchange as a surgical intervention.  The patient's history has been reviewed, patient examined, no change in status, stable for surgery.  I have reviewed the patient's chart and labs.  Questions were answered to the patient's satisfaction.     [name]

## 2020-04-10 NOTE — Anesthesia Procedure Notes (Signed)
Procedure Name: Intubation Performed by: Fletcher-Harrison, Ahuva Poynor, CRNA Pre-anesthesia Checklist: Patient identified, Emergency Drugs available, Suction available and Patient being monitored Patient Re-evaluated:Patient Re-evaluated prior to induction Oxygen Delivery Method: Circle system utilized Preoxygenation: Pre-oxygenation with 100% oxygen Induction Type: IV induction Ventilation: Mask ventilation without difficulty Laryngoscope Size: McGraph and 3 Grade View: Grade I Tube type: Oral Tube size: 6.5 mm Number of attempts: 1 Airway Equipment and Method: Stylet and Oral airway Placement Confirmation: ETT inserted through vocal cords under direct vision,  positive ETCO2,  breath sounds checked- equal and bilateral and CO2 detector Secured at: 21 cm Tube secured with: Tape Dental Injury: Teeth and Oropharynx as per pre-operative assessment        

## 2020-04-10 NOTE — Op Note (Signed)
Laparoscopic revision of peritoneal Dialysis catheterwith removal of intraluminal material Laparoscopic Omentopexy   Pre-operative Diagnosis: ESRD, PD catheter dysfunction   Post-operative Diagnosis: same     Surgeon: Caroleen Hamman, MD FACS   Anesthesia: Gen. 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 The benefits, complications, treatment options, and expected outcomes were discussed with the patient. The risks of bleeding, infection, recurrence of symptoms, failure to resolve symptoms, catheter malfunction bowel injury, any of which could require further surgery were reviewed with the patient. The likelihood of improving the patient's symptoms with return to their baseline status is good.  The patient and/or family concurred with the proposed plan, giving informed consent.  The patient was taken to Operating Room, identified and the procedure verified. A Time Out was held and the above information confirmed. Please see Dr. Bunnie Domino report regarding exchange of permcath. Prior to the induction of general anesthesia, antibiotic prophylaxis was administered. VTE prophylaxis was in place. General endotracheal anesthesia was then administered and tolerated well. After the induction, the abdomen was prepped with Chloraprep and draped in the sterile fashion. The patient was positioned in the supine position. Veres needle was used to gain access to the abd cavity. Palmer point was identified and veres needle inserted, drop test was successful and intra-abdominal pressures were appropriate. CO2 insufflation performed w/o HD changes. Three 5 mm trochars were placed under direct visualization using optiview technique  for the first one. Laparoscopy revealed no evidence of injuries and catheter wrapped around an omental tongue. I freed the catheter from the adhesion and performed omentopexy of this tongue tacking the omentum piece to the abd wall using 2-0 vycril suture. I was then able to retrieve the catheter tip via one of the ports and performed direct inspection. There was intraluminal material that I removed using gentle pressure. I tested the catheter exvivo and functioned perfectly. I inserted the catheter back to the abdominal cavity and placed it in the pelvis. 1 lt heparinized saline instilled to the abdominal cavity and there was no obstruction and excellent returned after the bad was placed below the level of the  abdominal cavity.    All skin incisions  were infiltrated with a liposomal Marcaine. 4-0 subcuticular Monocryl was used to close the skin. Dermabond was  applied. Sterile dressing applied to the catheter. The patient was then extubated and brought to the recovery room in stable condition. Sponge, lap, and needle counts were correct at closure and at the conclusion of the case.               Caroleen Hamman, MD, FACS

## 2020-04-10 NOTE — Transfer of Care (Signed)
Immediate Anesthesia Transfer of Care Note  Patient: Anthony Hess  Procedure(s) Performed: LAPAROSCOPIC REVISION OF CONTINUOUS AMBULATORY PERITONEAL DIALYSIS  (CAPD) CATHETER (N/A ) EXCHANGE OF A DIALYSIS CATHETER (Right Neck)  Patient Location: PACU  Anesthesia Type:General  Level of Consciousness: drowsy, patient cooperative and responds to stimulation  Airway & Oxygen Therapy: Patient Spontanous Breathing and Patient connected to face mask oxygen  Post-op Assessment: Report given to RN and Post -op Vital signs reviewed and stable  Post vital signs: Reviewed and stable  Last Vitals:  Vitals Value Taken Time  BP 188/110 04/10/20 1538  Temp    Pulse 73 04/10/20 1540  Resp 16 04/10/20 1540  SpO2 100 % 04/10/20 1540  Vitals shown include unvalidated device data.  Last Pain:  Vitals:   04/10/20 1308  TempSrc: Tympanic  PainSc: 0-No pain         Complications: No complications documented.

## 2020-04-10 NOTE — Progress Notes (Signed)
Dr Rosey Bath aware of b/p of 159/109. Not going to treat unless diastolic gets higher than 110

## 2020-04-11 ENCOUNTER — Encounter: Payer: Self-pay | Admitting: Surgery

## 2020-04-13 ENCOUNTER — Observation Stay
Admission: EM | Admit: 2020-04-13 | Discharge: 2020-04-13 | Disposition: A | Discharge status: Home / Self Care | Payer: Medicare Other | Attending: Internal Medicine | Admitting: Internal Medicine

## 2020-04-13 ENCOUNTER — Other Ambulatory Visit: Payer: Self-pay

## 2020-04-13 ENCOUNTER — Encounter: Payer: Self-pay | Admitting: Emergency Medicine

## 2020-04-13 DIAGNOSIS — Z8551 Personal history of malignant neoplasm of bladder: Secondary | ICD-10-CM | POA: Insufficient documentation

## 2020-04-13 DIAGNOSIS — T85691A Other mechanical complication of intraperitoneal dialysis catheter, initial encounter: Secondary | ICD-10-CM | POA: Diagnosis present

## 2020-04-13 DIAGNOSIS — I120 Hypertensive chronic kidney disease with stage 5 chronic kidney disease or end stage renal disease: Secondary | ICD-10-CM | POA: Insufficient documentation

## 2020-04-13 DIAGNOSIS — I2510 Atherosclerotic heart disease of native coronary artery without angina pectoris: Secondary | ICD-10-CM | POA: Diagnosis not present

## 2020-04-13 DIAGNOSIS — Z79899 Other long term (current) drug therapy: Secondary | ICD-10-CM | POA: Diagnosis not present

## 2020-04-13 DIAGNOSIS — T8090XA Unspecified complication following infusion and therapeutic injection, initial encounter: Secondary | ICD-10-CM

## 2020-04-13 DIAGNOSIS — Z20822 Contact with and (suspected) exposure to covid-19: Secondary | ICD-10-CM | POA: Insufficient documentation

## 2020-04-13 DIAGNOSIS — T85611A Breakdown (mechanical) of intraperitoneal dialysis catheter, initial encounter: Secondary | ICD-10-CM | POA: Diagnosis present

## 2020-04-13 DIAGNOSIS — Z7982 Long term (current) use of aspirin: Secondary | ICD-10-CM | POA: Diagnosis not present

## 2020-04-13 DIAGNOSIS — T85691D Other mechanical complication of intraperitoneal dialysis catheter, subsequent encounter: Secondary | ICD-10-CM

## 2020-04-13 DIAGNOSIS — N186 End stage renal disease: Secondary | ICD-10-CM | POA: Diagnosis not present

## 2020-04-13 DIAGNOSIS — Z87891 Personal history of nicotine dependence: Secondary | ICD-10-CM | POA: Insufficient documentation

## 2020-04-13 DIAGNOSIS — Z992 Dependence on renal dialysis: Secondary | ICD-10-CM | POA: Insufficient documentation

## 2020-04-13 DIAGNOSIS — Y828 Other medical devices associated with adverse incidents: Secondary | ICD-10-CM | POA: Insufficient documentation

## 2020-04-13 DIAGNOSIS — Z951 Presence of aortocoronary bypass graft: Secondary | ICD-10-CM | POA: Diagnosis not present

## 2020-04-13 LAB — COMPREHENSIVE METABOLIC PANEL
ALT: <5 U/L (ref 0–44)
ALT: <5 U/L (ref 0–44)
AST: 15 U/L (ref 15–41)
AST: 20 U/L (ref 15–41)
Albumin: 2.8 g/dL — ABNORMAL LOW (ref 3.5–5.0)
Albumin: 3.3 g/dL — ABNORMAL LOW (ref 3.5–5.0)
Alkaline Phosphatase: 38 U/L (ref 38–126)
Alkaline Phosphatase: 42 U/L (ref 38–126)
Anion gap: 10 (ref 5–15)
Anion gap: 11 (ref 5–15)
BUN: 74 mg/dL — ABNORMAL HIGH (ref 8–23)
BUN: 76 mg/dL — ABNORMAL HIGH (ref 8–23)
CO2: 19 mmol/L — ABNORMAL LOW (ref 22–32)
CO2: 21 mmol/L — ABNORMAL LOW (ref 22–32)
Calcium: 6.1 mg/dL — CL (ref 8.9–10.3)
Calcium: 6.1 mg/dL — CL (ref 8.9–10.3)
Chloride: 111 mmol/L (ref 98–111)
Chloride: 114 mmol/L — ABNORMAL HIGH (ref 98–111)
Creatinine, Ser: 6.07 mg/dL — ABNORMAL HIGH (ref 0.61–1.24)
Creatinine, Ser: 6.11 mg/dL — ABNORMAL HIGH (ref 0.61–1.24)
GFR, Estimated: 10 mL/min — ABNORMAL LOW (ref >60)
GFR, Estimated: 10 mL/min — ABNORMAL LOW (ref >60)
Glucose, Bld: 83 mg/dL (ref 70–99)
Glucose, Bld: 94 mg/dL (ref 70–99)
Potassium: 4.6 mmol/L (ref 3.5–5.1)
Potassium: 4.8 mmol/L (ref 3.5–5.1)
Sodium: 142 mmol/L (ref 135–145)
Sodium: 144 mmol/L (ref 135–145)
Total Bilirubin: 0.6 mg/dL (ref 0.3–1.2)
Total Bilirubin: 0.6 mg/dL (ref 0.3–1.2)
Total Protein: 5.2 g/dL — ABNORMAL LOW (ref 6.5–8.1)
Total Protein: 5.7 g/dL — ABNORMAL LOW (ref 6.5–8.1)

## 2020-04-13 LAB — CBC
HCT: 29.2 % — ABNORMAL LOW (ref 39.0–52.0)
HCT: 29.3 % — ABNORMAL LOW (ref 39.0–52.0)
HCT: 31 % — ABNORMAL LOW (ref 39.0–52.0)
Hemoglobin: 8.9 g/dL — ABNORMAL LOW (ref 13.0–17.0)
Hemoglobin: 9 g/dL — ABNORMAL LOW (ref 13.0–17.0)
Hemoglobin: 9.4 g/dL — ABNORMAL LOW (ref 13.0–17.0)
MCH: 27.7 pg (ref 26.0–34.0)
MCH: 27.8 pg (ref 26.0–34.0)
MCH: 28 pg (ref 26.0–34.0)
MCHC: 30.3 g/dL (ref 30.0–36.0)
MCHC: 30.4 g/dL (ref 30.0–36.0)
MCHC: 30.8 g/dL (ref 30.0–36.0)
MCV: 91 fL (ref 80.0–100.0)
MCV: 91.3 fL (ref 80.0–100.0)
MCV: 91.7 fL (ref 80.0–100.0)
Platelets: 196 10*3/uL (ref 150–400)
Platelets: 199 10*3/uL (ref 150–400)
Platelets: 214 10*3/uL (ref 150–400)
RBC: 3.21 MIL/uL — ABNORMAL LOW (ref 4.22–5.81)
RBC: 3.21 MIL/uL — ABNORMAL LOW (ref 4.22–5.81)
RBC: 3.38 MIL/uL — ABNORMAL LOW (ref 4.22–5.81)
RDW: 17.7 % — ABNORMAL HIGH (ref 11.5–15.5)
RDW: 17.7 % — ABNORMAL HIGH (ref 11.5–15.5)
RDW: 17.9 % — ABNORMAL HIGH (ref 11.5–15.5)
WBC: 6.2 10*3/uL (ref 4.0–10.5)
WBC: 7.2 10*3/uL (ref 4.0–10.5)
WBC: 8.1 10*3/uL (ref 4.0–10.5)
nRBC: 0 % (ref 0.0–0.2)
nRBC: 0 % (ref 0.0–0.2)
nRBC: 0 % (ref 0.0–0.2)

## 2020-04-13 LAB — RESP PANEL BY RT-PCR (FLU A&B, COVID) ARPGX2
Influenza A by PCR: NEGATIVE
Influenza B by PCR: NEGATIVE
SARS Coronavirus 2 by RT PCR: NEGATIVE

## 2020-04-13 LAB — CREATININE, SERUM
Creatinine, Ser: 6.19 mg/dL — ABNORMAL HIGH (ref 0.61–1.24)
GFR, Estimated: 10 mL/min — ABNORMAL LOW (ref >60)

## 2020-04-13 MED ORDER — CALCITRIOL 0.25 MCG PO CAPS
0.5000 ug | ORAL_CAPSULE | Freq: Every day | ORAL | Status: DC
  Administered 2020-04-13: 10:00:00 0.5 ug via ORAL
  Filled 2020-04-13: qty 2

## 2020-04-13 MED ORDER — SENNOSIDES-DOCUSATE SODIUM 8.6-50 MG PO TABS: 1.0000 | ORAL_TABLET | Freq: Every evening | ORAL | Status: DC | PRN

## 2020-04-13 MED ORDER — GENTAMICIN SULFATE 0.1 % EX CREA
1.0000 "application " | TOPICAL_CREAM | Freq: Every day | CUTANEOUS | Status: DC
  Filled 2020-04-13: qty 15

## 2020-04-13 MED ORDER — METOPROLOL SUCCINATE ER 50 MG PO TB24
50.0000 mg | ORAL_TABLET | Freq: Every day | ORAL | Status: DC
  Administered 2020-04-13: 10:00:00 50 mg via ORAL
  Filled 2020-04-13: qty 1

## 2020-04-13 MED ORDER — CALCIUM CARBONATE 1250 (500 CA) MG PO TABS
1250.0000 mg | ORAL_TABLET | Freq: Two times a day (BID) | ORAL | Status: DC
  Administered 2020-04-13 (×2): 1250 mg via ORAL
  Filled 2020-04-13 (×2): qty 1

## 2020-04-13 MED ORDER — MAGNESIUM CITRATE PO SOLN
1.0000 | Freq: Once | ORAL | Status: DC | PRN
  Filled 2020-04-13: qty 296

## 2020-04-13 MED ORDER — ASPIRIN EC 81 MG PO TBEC
81.0000 mg | DELAYED_RELEASE_TABLET | Freq: Every day | ORAL | Status: DC
  Administered 2020-04-13: 10:00:00 81 mg via ORAL
  Filled 2020-04-13: qty 1

## 2020-04-13 MED ORDER — IPRATROPIUM-ALBUTEROL 0.5-2.5 (3) MG/3ML IN SOLN: 3.0000 mL | Freq: Four times a day (QID) | RESPIRATORY_TRACT | Status: DC | PRN

## 2020-04-13 MED ORDER — BISACODYL 5 MG PO TBEC
5.0000 mg | DELAYED_RELEASE_TABLET | Freq: Every day | ORAL | Status: DC | PRN
Start: 2020-04-13 — End: 2020-04-13

## 2020-04-13 MED ORDER — ONDANSETRON HCL 4 MG PO TABS: 4.0000 mg | ORAL_TABLET | Freq: Four times a day (QID) | ORAL | Status: DC | PRN

## 2020-04-13 MED ORDER — HEPARIN 1000 UNIT/ML FOR PERITONEAL DIALYSIS
500.0000 [IU] | INTRAMUSCULAR | Status: DC | PRN
  Filled 2020-04-13: qty 0.5

## 2020-04-13 MED ORDER — ATORVASTATIN CALCIUM 20 MG PO TABS
40.0000 mg | ORAL_TABLET | Freq: Every day | ORAL | Status: DC
  Administered 2020-04-13: 40 mg via ORAL
  Filled 2020-04-13: qty 2

## 2020-04-13 MED ORDER — MORPHINE SULFATE (PF) 2 MG/ML IV SOLN: 1.0000 mg | Freq: Four times a day (QID) | INTRAVENOUS | Status: DC | PRN

## 2020-04-13 MED ORDER — DELFLEX-LC/2.5% DEXTROSE 394 MOSM/L IP SOLN
INTRAPERITONEAL | Status: DC
  Filled 2020-04-13: qty 3000

## 2020-04-13 MED ORDER — ACETAMINOPHEN 650 MG RE SUPP: 650.0000 mg | Freq: Four times a day (QID) | RECTAL | Status: DC | PRN

## 2020-04-13 MED ORDER — HYDROCODONE-ACETAMINOPHEN 5-325 MG PO TABS
1.0000 | ORAL_TABLET | Freq: Four times a day (QID) | ORAL | Status: DC | PRN
Start: 2020-04-13 — End: 2020-04-13

## 2020-04-13 MED ORDER — ACETAMINOPHEN 325 MG PO TABS: 650.0000 mg | ORAL_TABLET | Freq: Four times a day (QID) | ORAL | Status: DC | PRN

## 2020-04-13 MED ORDER — HEPARIN SODIUM (PORCINE) 5000 UNIT/ML IJ SOLN
5000.0000 [IU] | Freq: Three times a day (TID) | INTRAMUSCULAR | Status: DC
  Administered 2020-04-13 (×3): 5000 [IU] via SUBCUTANEOUS
  Filled 2020-04-13 (×3): qty 1

## 2020-04-13 MED ORDER — AMLODIPINE BESYLATE 10 MG PO TABS
10.0000 mg | ORAL_TABLET | Freq: Every day | ORAL | Status: DC
  Administered 2020-04-13: 10:00:00 10 mg via ORAL
  Filled 2020-04-13: qty 1

## 2020-04-13 MED ORDER — CALCIUM ACETATE (PHOS BINDER) 667 MG PO CAPS
1334.0000 mg | ORAL_CAPSULE | Freq: Three times a day (TID) | ORAL | Status: DC
  Administered 2020-04-13 (×2): 1334 mg via ORAL
  Filled 2020-04-13 (×4): qty 2

## 2020-04-13 MED ORDER — ONDANSETRON HCL 4 MG/2ML IJ SOLN: 4.0000 mg | Freq: Four times a day (QID) | INTRAMUSCULAR | Status: DC | PRN

## 2020-04-13 MED ORDER — HYDRALAZINE HCL 20 MG/ML IJ SOLN: 10.0000 mg | INTRAMUSCULAR | Status: DC | PRN

## 2020-04-13 NOTE — ED Notes (Signed)
Pt denies chest pain or SOB. PT does note he does feel a little full.

## 2020-04-13 NOTE — Progress Notes (Signed)
[place], [place] 04/13/20  Subjective:   LOS: 0 Patient known to our practice from outpatient dialysis Presented to the emergency room because he was unable to drain his PD catheter.  He feels like his abdomen was distended He recently underwent revision of PD catheter by Dr. Adora Fridge on February 17 In the outpatient setting, PD catheter was used successfully and patient was given intraperitoneal vancomycin 2 g  At present he denies any nausea, vomiting or abdominal pain No shortness of breath   Objective:  Vital signs in last 24 hours:  Temp:  [97.4 F (36.3 C)-98.4 F (36.9 C)] 98.1 F (36.7 C) (02/20 0814) Pulse Rate:  [63-76] 76 (02/20 0814) Resp:  [15-20] 15 (02/20 0814) BP: (155-192)/(100-121) 155/107 (02/20 0814) SpO2:  [96 %-100 %] 97 % (02/20 0814)  Weight change:  There were no vitals filed for this visit.  Intake/Output:    Intake/Output Summary (Last 24 hours) at 04/13/2020 1010 Last data filed at 04/13/2020 1005 Gross per 24 hour  Intake 358 ml  Output -  Net 358 ml    Physical Exam: General:  No acute distress, laying in the bed  HEENT  anicteric, moist oral mucous membrane  Pulm/lungs  normal breathing effort, lungs are clear to auscultation  CVS/Heart  regular rhythm, no rub or gallop+ systolic murmur  Abdomen:   Soft, nontender  Extremities:  No peripheral edema  Neurologic:  Alert, oriented, able to follow commands  Skin:  No acute rashes    Basic Metabolic Panel:  Recent Labs  Lab 04/10/20 1326 04/13/20 0000 04/13/20 0008 04/13/20 0450  NA 145  --  142 144  K 4.4  --  4.6 4.8  CL 116*  --  111 114*  CO2  --   --  21* 19*  GLUCOSE 82  --  94 83  BUN 50*  --  76* 74*  CREATININE 6.50* 6.19* 6.11* 6.07*  CALCIUM  --   --  6.1* 6.1*     CBC: Recent Labs  Lab 04/10/20 1326 04/13/20 0000 04/13/20 0008 04/13/20 0450  WBC  --  7.2 8.1 6.2  HGB 10.9* 8.9* 9.4* 9.0*  HCT 32.0* 29.3* 31.0* 29.2*  MCV   --  91.3 91.7 91.0  PLT  --  199 214 196      Lab Results  Component Value Date   HEPBSAG NON REACTIVE 12/27/2019   HEPBSAB NON REACTIVE 12/27/2019   HEPBIGM NON REACTIVE 12/27/2019      Microbiology:  Recent Results (from the past 240 hour(s))  SARS CORONAVIRUS 2 (TAT 6-24 HRS) Nasopharyngeal Nasopharyngeal Swab     Status: None   Collection Time: 04/09/20  2:00 PM   Specimen: Nasopharyngeal Swab  Result Value Ref Range Status   SARS Coronavirus 2 NEGATIVE NEGATIVE Final    Comment: (NOTE) SARS-CoV-2 target nucleic acids are NOT DETECTED.  The SARS-CoV-2 RNA is generally detectable in upper and lower respiratory specimens during the acute phase of infection. Negative results do not preclude SARS-CoV-2 infection, do not rule out co-infections with other pathogens, and should not be used as the sole basis for treatment or other patient management decisions. Negative results must be combined with clinical observations, patient history, and epidemiological information. The expected result is Negative.  Fact Sheet for Patients: SugarRoll.be  Fact Sheet for Healthcare Providers: https://www.woods-mathews.com/  This test is not yet approved or cleared by the [place] FDA and  has been authorized for detection and/or diagnosis of  SARS-CoV-2 by FDA under an Emergency Use Authorization (EUA). This EUA will remain  in effect (meaning this test can be used) for the duration of the COVID-19 declaration under Se ction 564(b)(1) of the Act, 21 U.S.C. section 360bbb-3(b)(1), unless the authorization is terminated or revoked sooner.  Performed at [facility], [place] [address], [place], [place] [address]   Resp Panel by RT-PCR (Flu A&B, Covid) Nasopharyngeal Swab     Status: None   Collection Time: 04/13/20 12:08 AM   Specimen: Nasopharyngeal Swab; Nasopharyngeal(NP) swabs in vial transport medium  Result Value Ref Range Status    SARS Coronavirus 2 by RT PCR NEGATIVE NEGATIVE Final    Comment: (NOTE) SARS-CoV-2 target nucleic acids are NOT DETECTED.  The SARS-CoV-2 RNA is generally detectable in upper respiratory specimens during the acute phase of infection. The lowest concentration of SARS-CoV-2 viral copies this assay can detect is 138 copies/mL. A negative result does not preclude SARS-Cov-2 infection and should not be used as the sole basis for treatment or other patient management decisions. A negative result may occur with  improper specimen collection/handling, submission of specimen other than nasopharyngeal swab, presence of viral mutation(s) within the areas targeted by this assay, and inadequate number of viral copies(<138 copies/mL). A negative result must be combined with clinical observations, patient history, and epidemiological information. The expected result is Negative.  Fact Sheet for Patients:  EntrepreneurPulse.com.au  Fact Sheet for Healthcare Providers:  IncredibleEmployment.be  This test is no t yet approved or cleared by the [place] FDA and  has been authorized for detection and/or diagnosis of SARS-CoV-2 by FDA under an Emergency Use Authorization (EUA). This EUA will remain  in effect (meaning this test can be used) for the duration of the COVID-19 declaration under Section 564(b)(1) of the Act, 21 U.S.C.section 360bbb-3(b)(1), unless the authorization is terminated  or revoked sooner.       Influenza A by PCR NEGATIVE NEGATIVE Final   Influenza B by PCR NEGATIVE NEGATIVE Final    Comment: (NOTE) The Xpert Xpress SARS-CoV-2/FLU/RSV plus assay is intended as an aid in the diagnosis of influenza from Nasopharyngeal swab specimens and should not be used as a sole basis for treatment. Nasal washings and aspirates are unacceptable for Xpert Xpress SARS-CoV-2/FLU/RSV testing.  Fact Sheet for  Patients: EntrepreneurPulse.com.au  Fact Sheet for Healthcare Providers: IncredibleEmployment.be  This test is not yet approved or cleared by the [place] FDA and has been authorized for detection and/or diagnosis of SARS-CoV-2 by FDA under an Emergency Use Authorization (EUA). This EUA will remain in effect (meaning this test can be used) for the duration of the COVID-19 declaration under Section 564(b)(1) of the Act, 21 U.S.C. section 360bbb-3(b)(1), unless the authorization is terminated or revoked.  Performed at [facility], [address] [place],  [address]     Coagulation Studies: No results for input(s): LABPROT, INR in the last 72 hours.  Urinalysis: No results for input(s): COLORURINE, LABSPEC, PHURINE, GLUCOSEU, HGBUR, BILIRUBINUR, KETONESUR, PROTEINUR, UROBILINOGEN, NITRITE, LEUKOCYTESUR in the last 72 hours.  Invalid input(s): APPERANCEUR    Imaging: No results found.   Medications:    . amLODipine  10 mg Oral Daily  . aspirin EC  81 mg Oral Daily  . atorvastatin  40 mg Oral Daily  . calcitRIOL  0.5 mcg Oral Daily  . calcium acetate  1,334 mg Oral TID WC  . calcium carbonate  1,250 mg Oral BID BM  . heparin  5,000  Units Subcutaneous Q8H  . metoprolol succinate  50 mg Oral Daily   acetaminophen **OR** acetaminophen, bisacodyl, HYDROcodone-acetaminophen, ipratropium-albuterol, magnesium citrate, morphine injection, ondansetron **OR** ondansetron (ZOFRAN) IV, senna-docusate  Assessment/ Plan:  65 y.o. male with End-stage renal disease, coronary artery disease status post MI, hypertension, history of bladder cancer was admitted on 04/13/2020 for  Active Problems:   Peritoneal dialysis catheter mechanical complication [facility])  Peritoneal dialysis catheter mechanical complication ([place]) [M41.583E] Complication of peritoneal dialysis, initial encounter [T80.90XA]  #. ESRD with complication of medical  device Patient reports he was unable to drain PD fluid last night We will attempt peritoneal dialysis this morning to assess flow of the catheter.  Dialysis nurse has been notified. Patient has tunneled dialysis catheter for backup hemodialysis  #. Anemia of CKD  Lab Results  Component Value Date   HGB 9.0 (L) 04/13/2020   Low dose EPO as outpatient  #. Secondary hyperparathyroidism of renal origin N 25.81      Component Value Date/Time   PTH 159 (H) 12/27/2019 1756   Lab Results  Component Value Date   PHOS 7.0 (H) 01/08/2020   Monitor calcium and phos level during this admission     LOS: 0 Sherlon Nied 2/20/202210:10 [place], [place]

## 2020-04-13 NOTE — Progress Notes (Signed)
PROGRESS NOTE    Anthony Hess  KDT:[id number] DOB: 06-16-1955 DOA: 04/13/2020 PCP: Jon Billings, NP   Brief Narrative:  65 year old with history of ESRD on PD at home, CAD status post MI, HTN, bladder cancer came to the ER with complains of malfunctioning PD reported of abdominal distention and bloating.  Missed two full nights of PD.  Admitted with concerns of malfunctioning PD equipment.   Assessment & Plan:   Active Problems:   Peritoneal dialysis catheter mechanical complication (HCC)  ESRD on PD at home Malfunctioning PD equipment -Admitted to the hospital.  Nephrology team consulted to help facilitate dialysis plans.  Closely monitor electrolytes including potassium and bicarb -Continue rest of the medications as planned.  Adjust as needed-defer to nephrology -Recently had tunneled catheter placed on 2/17 due to obstruction/dysfunction of PD catheter.  Hypocalcemia -Might need renal meds adjusted  Essential hypertension   -Norvasc and metoprolol  CAD -Chest pain-free.  Aspirin statin  Hyperlipidemia -On statin    DVT prophylaxis: Subcu heparin Code Status: Full code Family Communication:     Dispo: The patient is from: Home              Anticipated d/c is to: Home              Anticipated d/c date is: 2 days              Patient currently is not medically stable to d/c.  Maintain hospital stay until cleared by nephrology and we have a safe outpatient dialysis plan.   Difficult to place patient No  There is no height or weight on file to calculate BMI.      Subjective: Patient feels okay.  Tells me he has not been dialyzed in couple of days due to issues with his peritoneal dialysis catheter in his equipment.  Does have mild exertional shortness of breath due to this.  Review of Systems Otherwise negative except as per HPI, including: General: Denies fever, chills, night sweats or unintended weight loss. Resp: Denies cough, wheezing, shortness of  breath. Cardiac: Denies chest pain, palpitations, orthopnea, paroxysmal nocturnal dyspnea. GI: Denies abdominal pain, nausea, vomiting, diarrhea or constipation GU: Denies dysuria, frequency, hesitancy or incontinence MS: Denies muscle aches, joint pain or swelling Neuro: Denies headache, neurologic deficits (focal weakness, numbness, tingling), abnormal gait Psych: Denies anxiety, depression, SI/HI/AVH Skin: Denies new rashes or lesions ID: Denies sick contacts, exotic exposures, travel  Examination:  General exam: Appears calm and comfortable  Respiratory system: Clear to auscultation. Respiratory effort normal. Cardiovascular system: S1 & S2 heard, RRR. No JVD, murmurs, rubs, gallops or clicks. No pedal edema. Gastrointestinal system: Abdomen is nondistended, soft and nontender. No organomegaly or masses felt. Normal bowel sounds heard. Central nervous system: Alert and oriented. No focal neurological deficits. Extremities: Symmetric 5 x 5 power. Skin: No rashes, lesions or ulcers Psychiatry: Judgement and insight appear normal. Mood & affect appropriate.   PD catheter in place  Objective: Vitals:   04/13/20 0008 04/13/20 0220 04/13/20 0255 04/13/20 0502  BP: (!) 192/117 (!) 173/112 (!) 188/121 (!) 176/100  Pulse:  66 67 63  Resp:  20 16 18   Temp:   (!) 97.4 F (36.3 C) 97.8 F (36.6 C)  TempSrc:   Oral Oral  SpO2:  96% 100% 96%    Intake/Output Summary (Last 24 hours) at 04/13/2020 0803 Last data filed at 04/13/2020 0230 Gross per 24 hour  Intake 118 ml  Output --  Net 118 ml  There were no vitals filed for this visit.   Data Reviewed:   CBC: Recent Labs  Lab 04/10/20 1326 04/13/20 0000 04/13/20 0008 04/13/20 0450  WBC  --  7.2 8.1 6.2  HGB 10.9* 8.9* 9.4* 9.0*  HCT 32.0* 29.3* 31.0* 29.2*  MCV  --  91.3 91.7 91.0  PLT  --  199 214 161   Basic Metabolic Panel: Recent Labs  Lab 04/10/20 1326 04/13/20 0000 04/13/20 0008 04/13/20 0450  NA 145  --   142 144  K 4.4  --  4.6 4.8  CL 116*  --  111 114*  CO2  --   --  21* 19*  GLUCOSE 82  --  94 83  BUN 50*  --  76* 74*  CREATININE 6.50* 6.19* 6.11* 6.07*  CALCIUM  --   --  6.1* 6.1*   GFR: Estimated Creatinine Clearance: 10.2 mL/min (A) (by C-G formula based on SCr of 6.07 mg/dL (H)). Liver Function Tests: Recent Labs  Lab 04/13/20 0008 04/13/20 0450  AST 20 15  ALT <5 <5  ALKPHOS 42 38  BILITOT 0.6 0.6  PROT 5.7* 5.2*  ALBUMIN 3.3* 2.8*   No results for input(s): LIPASE, AMYLASE in the last 168 hours. No results for input(s): AMMONIA in the last 168 hours. Coagulation Profile: No results for input(s): INR, PROTIME in the last 168 hours. Cardiac Enzymes: No results for input(s): CKTOTAL, CKMB, CKMBINDEX, TROPONINI in the last 168 hours. BNP (last 3 results) No results for input(s): PROBNP in the last 8760 hours. HbA1C: No results for input(s): HGBA1C in the last 72 hours. CBG: No results for input(s): GLUCAP in the last 168 hours. Lipid Profile: No results for input(s): CHOL, HDL, LDLCALC, TRIG, CHOLHDL, LDLDIRECT in the last 72 hours. Thyroid Function Tests: No results for input(s): TSH, T4TOTAL, FREET4, T3FREE, THYROIDAB in the last 72 hours. Anemia Panel: No results for input(s): VITAMINB12, FOLATE, FERRITIN, TIBC, IRON, RETICCTPCT in the last 72 hours. Sepsis Labs: No results for input(s): PROCALCITON, LATICACIDVEN in the last 168 hours.  Recent Results (from the past 240 hour(s))  SARS CORONAVIRUS 2 (TAT 6-24 HRS) Nasopharyngeal Nasopharyngeal Swab     Status: None   Collection Time: 04/09/20  2:00 PM   Specimen: Nasopharyngeal Swab  Result Value Ref Range Status   SARS Coronavirus 2 NEGATIVE NEGATIVE Final    Comment: (NOTE) SARS-CoV-2 target nucleic acids are NOT DETECTED.  The SARS-CoV-2 RNA is generally detectable in upper and lower respiratory specimens during the acute phase of infection. Negative results do not preclude SARS-CoV-2 infection, do not  rule out co-infections with other pathogens, and should not be used as the sole basis for treatment or other patient management decisions. Negative results must be combined with clinical observations, patient history, and epidemiological information. The expected result is Negative.  Fact Sheet for Patients: SugarRoll.be  Fact Sheet for Healthcare Providers: https://www.woods-mathews.com/  This test is not yet approved or cleared by the [place] FDA and  has been authorized for detection and/or diagnosis of SARS-CoV-2 by FDA under an Emergency Use Authorization (EUA). This EUA will remain  in effect (meaning this test can be used) for the duration of the COVID-19 declaration under Se ction 564(b)(1) of the Act, 21 U.S.C. section 360bbb-3(b)(1), unless the authorization is terminated or revoked sooner.  Performed at [facility], [place] [address] [place], [place] [address]   Resp Panel by RT-PCR (Flu A&B, Covid) Nasopharyngeal Swab     Status: None  Collection Time: 04/13/20 12:08 AM   Specimen: Nasopharyngeal Swab; Nasopharyngeal(NP) swabs in vial transport medium  Result Value Ref Range Status   SARS Coronavirus 2 by RT PCR NEGATIVE NEGATIVE Final    Comment: (NOTE) SARS-CoV-2 target nucleic acids are NOT DETECTED.  The SARS-CoV-2 RNA is generally detectable in upper respiratory specimens during the acute phase of infection. The lowest concentration of SARS-CoV-2 viral copies this assay can detect is 138 copies/mL. A negative result does not preclude SARS-Cov-2 infection and should not be used as the sole basis for treatment or other patient management decisions. A negative result may occur with  improper specimen collection/handling, submission of specimen other than nasopharyngeal swab, presence of viral mutation(s) within the areas targeted by this assay, and inadequate number of viral copies(<138 copies/mL). A negative  result must be combined with clinical observations, patient history, and epidemiological information. The expected result is Negative.  Fact Sheet for Patients:  EntrepreneurPulse.com.au  Fact Sheet for Healthcare Providers:  IncredibleEmployment.be  This test is no t yet approved or cleared by the [place] FDA and  has been authorized for detection and/or diagnosis of SARS-CoV-2 by FDA under an Emergency Use Authorization (EUA). This EUA will remain  in effect (meaning this test can be used) for the duration of the COVID-19 declaration under Section 564(b)(1) of the Act, 21 U.S.C.section 360bbb-3(b)(1), unless the authorization is terminated  or revoked sooner.       Influenza A by PCR NEGATIVE NEGATIVE Final   Influenza B by PCR NEGATIVE NEGATIVE Final    Comment: (NOTE) The Xpert Xpress SARS-CoV-2/FLU/RSV plus assay is intended as an aid in the diagnosis of influenza from Nasopharyngeal swab specimens and should not be used as a sole basis for treatment. Nasal washings and aspirates are unacceptable for Xpert Xpress SARS-CoV-2/FLU/RSV testing.  Fact Sheet for Patients: EntrepreneurPulse.com.au  Fact Sheet for Healthcare Providers: IncredibleEmployment.be  This test is not yet approved or cleared by the [place] FDA and has been authorized for detection and/or diagnosis of SARS-CoV-2 by FDA under an Emergency Use Authorization (EUA). This EUA will remain in effect (meaning this test can be used) for the duration of the COVID-19 declaration under Section 564(b)(1) of the Act, 21 U.S.C. section 360bbb-3(b)(1), unless the authorization is terminated or revoked.  Performed at [facility], [address] [place], [place] [address]          Radiology Studies: No results found.      Scheduled Meds: . amLODipine  10 mg Oral Daily  . aspirin EC  81 mg Oral Daily  .  atorvastatin  40 mg Oral Daily  . calcitRIOL  0.5 mcg Oral Daily  . calcium acetate  1,334 mg Oral TID WC  . calcium carbonate  1,250 mg Oral BID BM  . heparin  5,000 Units Subcutaneous Q8H  . metoprolol succinate  50 mg Oral Daily   Continuous Infusions:   LOS: 0 days   Time spent= 35 mins    Cambrie Sonnenfeld Arsenio Loader, MD [facility]  If 7PM-7AM, please contact night-coverage  04/13/2020, 8:03 AM

## 2020-04-13 NOTE — ED Notes (Signed)
Critical lab value  Calcium-6.1  Dr. Alfred Levins notified face to face

## 2020-04-13 NOTE — H&P (Addendum)
History and Physical   [facility] -  @ [facility] Admission History and Physical [employer], D.O.    Patient Name: Anthony Hess MR#: [id number] Date of Birth: 23-Feb-1955 Date of Admission: 04/13/2020  Referring MD/NP/PA: Dr. Alfred Levins Primary Care Physician: Jon Billings, NP  Chief Complaint:  Chief Complaint  Patient presents with  . Abdominal Pain    HPI: Anthony Hess is a 65 y.o. male with a known history of ESRD on PD at home, CAD s/p MI, HTN, bladder cancer presents to the emergency department for evaluation of malfunctioning PD equipment.  Reports abdominal distention and bloating, about 6 pounds over target weight. Missed two full nights of PD.  Recently had PD catheter replaced by Dr. Dahlia Byes.  Patient denies fevers/chills, weakness, dizziness, chest pain, shortness of breath, N/V/C/D, , dysuria/frequency, changes in mental status.    Otherwise there has been no change in status. Patient has been taking medication as prescribed and there has been no recent change in medication or diet.  No recent antibiotics.  There has been no recent illness, hospitalizations, travel or sick contacts.    EMS/ED Course:  Medical admission has been requested for further management of malfunctioning PD equipment.  Review of Systems:  CONSTITUTIONAL: No fever/chills, fatigue, weakness, weight gain/loss, headache. EYES: No blurry or double vision. ENT: No tinnitus, postnasal drip, redness or soreness of the oropharynx. RESPIRATORY: No cough, dyspnea, wheeze.  No hemoptysis.  CARDIOVASCULAR: No chest pain, palpitations, syncope, orthopnea. No lower extremity edema.  GASTROINTESTINAL: Abdominal distention. No nausea, vomiting, abdominal pain, diarrhea, constipation.  No hematemesis, melena or hematochezia. GENITOURINARY: No dysuria, frequency, hematuria. ENDOCRINE: No polyuria or nocturia. No heat or cold intolerance. HEMATOLOGY: No anemia, bruising,  bleeding. INTEGUMENTARY: No rashes, ulcers, lesions. MUSCULOSKELETAL: No arthritis, gout, dyspnea. NEUROLOGIC: No numbness, tingling, ataxia, seizure-type activity, weakness. PSYCHIATRIC: No anxiety, depression, insomnia.   Past Medical History:  Diagnosis Date  . Bladder tumor   . Cancer [facility])    Bladder  . Chronic kidney disease    renal insufficiency  . Coronary artery disease   . History of kidney stones   . Hypertension   . Myocardial infarction ([place])   . Wears glasses     Past Surgical History:  Procedure Laterality Date  . CAPD INSERTION N/A 12/31/2019   Procedure: LAPAROSCOPIC INSERTION CONTINUOUS AMBULATORY PERITONEAL DIALYSIS  (CAPD) CATHETER;  Surgeon: Jules Husbands, MD;  Location: [facility] ORS;  Service: General;  Laterality: N/A;  . CAPD REMOVAL N/A 04/10/2020   Procedure: LAPAROSCOPIC REVISION OF CONTINUOUS AMBULATORY PERITONEAL DIALYSIS  (CAPD) CATHETER;  Surgeon: Jules Husbands, MD;  Location: [facility];  Service: General;  Laterality: N/A;  . CORONARY ARTERY BYPASS GRAFT N/A 12/27/2018   Procedure: CORONARY ARTERY BYPASS GRAFTING (CABG) X 4 ON PUMP USING RIGHT & LEFT INTERNAL MAMMARY ARTERY LEFT RADIAL ARTERY ENDOSCOPICALLY HARVESTED;  Surgeon: [name], MD;  Location: [place];  Service: Open Heart Surgery;  Laterality: N/A;  . CYSTOSCOPY W/ RETROGRADES Bilateral 05/15/2019   Procedure: CYSTOSCOPY WITH RETROGRADE PYELOGRAM;  Surgeon: Abbie Sons, MD;  Location: [facility];  Service: Urology;  Laterality: Bilateral;  . CYSTOSCOPY WITH BIOPSY N/A 05/15/2019   Procedure: CYSTOSCOPY WITH bladder BIOPSY;  Surgeon: Abbie Sons, MD;  Location: [facility];  Service: Urology;  Laterality: N/A;  . DIALYSIS/PERMA CATHETER INSERTION N/A 12/28/2019   Procedure: DIALYSIS/PERMA CATHETER INSERTION;  Surgeon: Algernon Huxley, MD;  Location: [place] [facility];  Service: Cardiovascular;  Laterality: N/A;  . EXCHANGE  OF A DIALYSIS CATHETER Right 04/10/2020   Procedure: EXCHANGE OF  A DIALYSIS CATHETER;  Surgeon: Jules Husbands, MD;  Location: [facility];  Service: General;  Laterality: Right;  . IR IMAGE GUIDED DRAINAGE PERCUT CATH  PERITONEAL RETROPERIT  04/07/2020  . LEFT HEART CATH AND CORONARY ANGIOGRAPHY Left 12/20/2018   Procedure: LEFT HEART CATH AND CORONARY ANGIOGRAPHY;  Surgeon: Isaias Cowman, MD;  Location: [place] [facility];  Service: Cardiovascular;  Laterality: Left;  . RADIAL ARTERY HARVEST Left 12/27/2018   Procedure: ENDOSCOPIC RADIAL ARTERY HARVEST;  Surgeon: [name], MD;  Location: [place];  Service: Open Heart Surgery;  Laterality: Left;  . TEE WITHOUT CARDIOVERSION N/A 12/27/2018   Procedure: TRANSESOPHAGEAL ECHOCARDIOGRAM (TEE);  Surgeon: [name], MD;  Location: [place];  Service: Open Heart Surgery;  Laterality: N/A;  . TUMOR REMOVAL  2019   Bladder     reports that he quit smoking about 13 months ago. His smoking use included cigarettes. He smoked 0.25 packs per day. He has never used smokeless tobacco. He reports that he does not drink alcohol and does not use drugs.  No Known Allergies  Family History  Family history unknown: Yes    Prior to Admission medications   Medication Sig Start Date End Date Taking? Authorizing Provider  amLODipine (NORVASC) 10 MG tablet Take 1 tablet (10 mg total) by mouth daily. 01/01/20   Fritzi Mandes, MD  aspirin EC 81 MG tablet Take 81 mg by mouth daily.    [name], MD  atorvastatin (LIPITOR) 40 MG tablet Take 1 tablet (40 mg total) by mouth daily. 01/01/20   Fritzi Mandes, MD  calcitRIOL (ROCALTROL) 0.5 MCG capsule Take 1 capsule (0.5 mcg total) by mouth daily. 01/01/20   Fritzi Mandes, MD  calcium acetate (PHOSLO) 667 MG capsule Take 2 capsules (1,334 mg total) by mouth 3 (three) times daily with meals. 01/01/20   Fritzi Mandes, MD  calcium carbonate (OS-CAL - DOSED IN MG OF ELEMENTAL CALCIUM) 1250 (500 Ca) MG tablet Take 1 tablet (1,250 mg total) by mouth 2 (two) times daily between meals.  01/01/20   Fritzi Mandes, MD  HYDROcodone-acetaminophen (NORCO/VICODIN) 5-325 MG tablet Take 1 tablet by mouth every 6 (six) hours as needed for moderate pain. 04/10/20   Pabon, Diego F, MD  metoprolol succinate (TOPROL-XL) 50 MG 24 hr tablet Take 1 tablet (50 mg total) by mouth daily. Take with or immediately following a meal. 01/01/20   Fritzi Mandes, MD  Vitamin D, Ergocalciferol, (DRISDOL) 1.25 MG (50000 UNIT) CAPS capsule Take 1 capsule (50,000 Units total) by mouth every 7 (seven) days. Patient taking differently: Take 50,000 Units by mouth every Saturday. 01/05/20   Fritzi Mandes, MD    Physical Exam: Vitals:   04/13/20 0004 04/13/20 0008  BP:  (!) 192/117  Pulse: 70   Resp: 20   Temp: 98.4 F (36.9 C)   TempSrc: Oral   SpO2: 100%     GENERAL: 65 y.o.-year-old male patient, well-developed, well-nourished lying in the bed in no acute distress.  Pleasant and cooperative.   HEENT: Head atraumatic, normocephalic. Pupils equal. Mucus membranes moist. NECK: Supple. No JVD. CHEST: Normal breath sounds bilaterally. No wheezing, rales, rhonchi or crackles. No use of accessory muscles of respiration.  No reproducible chest wall tenderness.  R chest wall catheter, midline scar well healed CARDIOVASCULAR: S1, S2 normal. No murmurs, rubs, or gallops. ABDOMEN: Soft, distended, mild diffuse tenderness.   No rebound, guarding, rigidity. Normoactive  bowel sounds present in all four quadrants.  EXTREMITIES: No pedal edema, cyanosis, or clubbing. No calf tenderness or Homan's sign.  NEUROLOGIC: The patient is alert and oriented x 3. Cranial nerves II through XII are grossly intact with no focal sensorimotor deficit. PSYCHIATRIC:  Normal affect, mood, thought content. SKIN: Warm, dry, and intact without obvious rash, lesion, or ulcer.    Labs on Admission:  CBC: Recent Labs  Lab 04/10/20 1326 04/13/20 0008  WBC  --  8.1  HGB 10.9* 9.4*  HCT 32.0* 31.0*  MCV  --  91.7  PLT  --  253   Basic  Metabolic Panel: Recent Labs  Lab 04/10/20 1326 04/13/20 0008  NA 145 142  K 4.4 4.6  CL 116* 111  CO2  --  21*  GLUCOSE 82 94  BUN 50* 76*  CREATININE 6.50* 6.11*  CALCIUM  --  6.1*   GFR: Estimated Creatinine Clearance: 10.1 mL/min (A) (by C-G formula based on SCr of 6.11 mg/dL (H)). Liver Function Tests: Recent Labs  Lab 04/13/20 0008  AST 20  ALT <5  ALKPHOS 42  BILITOT 0.6  PROT 5.7*  ALBUMIN 3.3*   No results for input(s): LIPASE, AMYLASE in the last 168 hours. No results for input(s): AMMONIA in the last 168 hours. Coagulation Profile: No results for input(s): INR, PROTIME in the last 168 hours. Cardiac Enzymes: No results for input(s): CKTOTAL, CKMB, CKMBINDEX, TROPONINI in the last 168 hours. BNP (last 3 results) No results for input(s): PROBNP in the last 8760 hours. HbA1C: No results for input(s): HGBA1C in the last 72 hours. CBG: No results for input(s): GLUCAP in the last 168 hours. Lipid Profile: No results for input(s): CHOL, HDL, LDLCALC, TRIG, CHOLHDL, LDLDIRECT in the last 72 hours. Thyroid Function Tests: No results for input(s): TSH, T4TOTAL, FREET4, T3FREE, THYROIDAB in the last 72 hours. Anemia Panel: No results for input(s): VITAMINB12, FOLATE, FERRITIN, TIBC, IRON, RETICCTPCT in the last 72 hours. Urine analysis:    Component Value Date/Time   COLORURINE STRAW (A) 12/27/2019 1756   APPEARANCEUR CLEAR (A) 12/27/2019 1756   APPEARANCEUR Cloudy (A) 05/08/2019 1448   LABSPEC 1.010 12/27/2019 1756   PHURINE 5.0 12/27/2019 1756   GLUCOSEU NEGATIVE 12/27/2019 1756   HGBUR NEGATIVE 12/27/2019 1756   BILIRUBINUR NEGATIVE 12/27/2019 1756   BILIRUBINUR Negative 05/08/2019 1448   KETONESUR NEGATIVE 12/27/2019 1756   PROTEINUR >=300 (A) 12/27/2019 1756   NITRITE NEGATIVE 12/27/2019 1756   LEUKOCYTESUR NEGATIVE 12/27/2019 1756   Sepsis Labs: @LABRCNTIP (procalcitonin:4,lacticidven:4) ) Recent Results (from the past 240 hour(s))  SARS  CORONAVIRUS 2 (TAT 6-24 HRS) Nasopharyngeal Nasopharyngeal Swab     Status: None   Collection Time: 04/09/20  2:00 PM   Specimen: Nasopharyngeal Swab  Result Value Ref Range Status   SARS Coronavirus 2 NEGATIVE NEGATIVE Final    Comment: (NOTE) SARS-CoV-2 target nucleic acids are NOT DETECTED.  The SARS-CoV-2 RNA is generally detectable in upper and lower respiratory specimens during the acute phase of infection. Negative results do not preclude SARS-CoV-2 infection, do not rule out co-infections with other pathogens, and should not be used as the sole basis for treatment or other patient management decisions. Negative results must be combined with clinical observations, patient history, and epidemiological information. The expected result is Negative.  Fact Sheet for Patients: SugarRoll.be  Fact Sheet for Healthcare Providers: https://www.woods-mathews.com/  This test is not yet approved or cleared by the [place] FDA and  has been authorized for detection and/or  diagnosis of SARS-CoV-2 by FDA under an Emergency Use Authorization (EUA). This EUA will remain  in effect (meaning this test can be used) for the duration of the COVID-19 declaration under Se ction 564(b)(1) of the Act, 21 U.S.C. section 360bbb-3(b)(1), unless the authorization is terminated or revoked sooner.  Performed at [facility], [place] [address], [place], [place] [address]      Radiological Exams on Admission: No results found.  EKG: Normal sinus rhythm at 69 bpm with normal axis and nonspecific ST-T wave changes.   Assessment/Plan  This is a 65 y.o. 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 Malfunctioning PD equipment requiring surgical consult - Admit observation for HD  - Nephrology to be contacted in AM - May need surgical consult to evaluate catheter  #. History of HTN - Continue Norvasc,  metoprolol  #. History of ESRD on PD - Continued calcitrol, Oscal, OPhoslo, Vit D  #. History of CAD/HLD - Continue atorvastatin, aspirin  Admission status: Obs IV Fluids: HL Diet/Nutrition: Renal with fluid restriction Consults called: Please call nephrology & surgery in AM  DVT Px: Lovenox  and early ambulation. Code Status: Full Code  Disposition Plan: To home in <24h days  All the records are reviewed and case discussed with ED provider. Management plans discussed with the patient and/or family who express understanding and agree with plan of care.  [employer] D.O. on 04/13/2020 at 12:40 AM CC: Primary care physician; Jon Billings, NP   04/13/2020, 12:40 AM

## 2020-04-13 NOTE — ED Notes (Signed)
Informed Kory, RN of bed assignment.

## 2020-04-13 NOTE — ED Triage Notes (Signed)
Pt arrived via EMS from home where pt has not been able to receive his "at home" peritoneal dialysis x1 day due to malfunctioning equipment. Pt hypertensive in route to ED by EMS. Pt c/o abdominal pain.

## 2020-04-13 NOTE — ED Provider Notes (Signed)
[facility] Emergency Department Provider Note  ____________________________________________  Time seen: Approximately 12:35 AM  I have reviewed the triage vital signs and the nursing notes.   HISTORY  Chief Complaint Abdominal Pain   HPI Anthony Hess is a 65 y.o. male with a history of ESRD on peritoneal dialysis who presents from home for a malfunctioning of his equipment.  Patient has been unable to dialyze himself last night and tonight because his equipment is not working.  He feels like his abdomen is distended due to fluid retention but denies abdominal pain, fever, chills, nausea, vomiting, chest pain or shortness of breath.  Patient does have a dialysis catheter on his right chest wall.   Past Medical History:  Diagnosis Date   Bladder tumor    Cancer [facility])    Bladder   Chronic kidney disease    renal insufficiency   Coronary artery disease    History of kidney stones    Hypertension    Myocardial infarction [facility])    Wears glasses     Patient Active Problem List   Diagnosis Date Noted   Peritoneal dialysis catheter mechanical complication ([place]) 14/78/2956   Chronic kidney disease due to hypertension 01/30/2020   Hyperparathyroidism due to renal insufficiency ([place]) 01/30/2020   Acute peritonitis ([place]) 01/08/2020   Hypotension 01/04/2020   Anemia in ESRD (end-stage renal disease) ([place]) 01/04/2020   Hyperlipidemia 01/04/2020   Mass of left side of neck 01/04/2020   Senile purpura ([place]) 01/04/2020   Chronic HFrEF (heart failure with reduced ejection fraction) ([facility])    Hydroureteronephrosis    Atrial fibrillation ([place]) 01/05/2019   ESRD (end stage renal disease) ([place]) 01/05/2019   Anemia in chronic kidney disease (CODE) 01/05/2019   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   Presence of aortocoronary bypass graft 12/27/2018   Emphysema lung ([place]) 04/14/2018   Bilateral hydronephrosis 04/03/2018   Hypertension 03/27/2018    Cigarette smoker 03/20/2018   History of bladder cancer 03/20/2018    Past Surgical History:  Procedure Laterality Date   CAPD INSERTION N/A 12/31/2019   Procedure: LAPAROSCOPIC INSERTION CONTINUOUS AMBULATORY PERITONEAL DIALYSIS  (CAPD) CATHETER;  Surgeon: Jules Husbands, MD;  Location: [facility];  Service: General;  Laterality: N/A;   CAPD REMOVAL N/A 04/10/2020   Procedure: LAPAROSCOPIC REVISION OF CONTINUOUS AMBULATORY PERITONEAL DIALYSIS  (CAPD) CATHETER;  Surgeon: Jules Husbands, MD;  Location: [facility];  Service: General;  Laterality: N/A;   CORONARY ARTERY BYPASS GRAFT N/A 12/27/2018   Procedure: CORONARY ARTERY BYPASS GRAFTING (CABG) X 4 ON PUMP USING RIGHT & LEFT INTERNAL MAMMARY ARTERY LEFT RADIAL ARTERY ENDOSCOPICALLY HARVESTED;  Surgeon: [name], MD;  Location: [place];  Service: Open Heart Surgery;  Laterality: N/A;   CYSTOSCOPY W/ RETROGRADES Bilateral 05/15/2019   Procedure: CYSTOSCOPY WITH RETROGRADE PYELOGRAM;  Surgeon: Abbie Sons, MD;  Location: [facility];  Service: Urology;  Laterality: Bilateral;   CYSTOSCOPY WITH BIOPSY N/A 05/15/2019   Procedure: CYSTOSCOPY WITH bladder BIOPSY;  Surgeon: Abbie Sons, MD;  Location: [facility];  Service: Urology;  Laterality: N/A;   DIALYSIS/PERMA CATHETER INSERTION N/A 12/28/2019   Procedure: DIALYSIS/PERMA CATHETER INSERTION;  Surgeon: Algernon Huxley, MD;  Location: [place] [facility];  Service: Cardiovascular;  Laterality: N/A;   EXCHANGE OF A DIALYSIS CATHETER Right 04/10/2020   Procedure: EXCHANGE OF A DIALYSIS CATHETER;  Surgeon: Jules Husbands, MD;  Location: [facility];  Service: General;  Laterality: Right;   IR IMAGE GUIDED DRAINAGE  PERCUT CATH  PERITONEAL RETROPERIT  04/07/2020   LEFT HEART CATH AND CORONARY ANGIOGRAPHY Left 12/20/2018   Procedure: LEFT HEART CATH AND CORONARY ANGIOGRAPHY;  Surgeon: Isaias Cowman, MD;  Location: [place] [facility];  Service: Cardiovascular;  Laterality: Left;   RADIAL  ARTERY HARVEST Left 12/27/2018   Procedure: ENDOSCOPIC RADIAL ARTERY HARVEST;  Surgeon: [name], MD;  Location: [place];  Service: Open Heart Surgery;  Laterality: Left;   TEE WITHOUT CARDIOVERSION N/A 12/27/2018   Procedure: TRANSESOPHAGEAL ECHOCARDIOGRAM (TEE);  Surgeon: [name], MD;  Location: [place];  Service: Open Heart Surgery;  Laterality: N/A;   TUMOR REMOVAL  2019   Bladder    Prior to Admission medications   Medication Sig Start Date End Date Taking? Authorizing Provider  amLODipine (NORVASC) 10 MG tablet Take 1 tablet (10 mg total) by mouth daily. 01/01/20   Fritzi Mandes, MD  aspirin EC 81 MG tablet Take 81 mg by mouth daily.    [name], MD  atorvastatin (LIPITOR) 40 MG tablet Take 1 tablet (40 mg total) by mouth daily. 01/01/20   Fritzi Mandes, MD  calcitRIOL (ROCALTROL) 0.5 MCG capsule Take 1 capsule (0.5 mcg total) by mouth daily. 01/01/20   Fritzi Mandes, MD  calcium acetate (PHOSLO) 667 MG capsule Take 2 capsules (1,334 mg total) by mouth 3 (three) times daily with meals. 01/01/20   Fritzi Mandes, MD  calcium carbonate (OS-CAL - DOSED IN MG OF ELEMENTAL CALCIUM) 1250 (500 Ca) MG tablet Take 1 tablet (1,250 mg total) by mouth 2 (two) times daily between meals. 01/01/20   Fritzi Mandes, MD  HYDROcodone-acetaminophen (NORCO/VICODIN) 5-325 MG tablet Take 1 tablet by mouth every 6 (six) hours as needed for moderate pain. 04/10/20   Pabon, Diego F, MD  metoprolol succinate (TOPROL-XL) 50 MG 24 hr tablet Take 1 tablet (50 mg total) by mouth daily. Take with or immediately following a meal. 01/01/20   Fritzi Mandes, MD  Vitamin D, Ergocalciferol, (DRISDOL) 1.25 MG (50000 UNIT) CAPS capsule Take 1 capsule (50,000 Units total) by mouth every 7 (seven) days. Patient taking differently: Take 50,000 Units by mouth every Saturday. 01/05/20   Fritzi Mandes, MD    Allergies Patient has no known allergies.  Family History  Family history unknown: Yes    Social History Social  History   Tobacco Use   Smoking status: Former Smoker    Packs/day: 0.25    Types: Cigarettes    Quit date: 02/19/2019    Years since quitting: 1.1   Smokeless tobacco: Never Used  Vaping Use   Vaping Use: Never used  Substance Use Topics   Alcohol use: Never   Drug use: Never    Review of Systems  Constitutional: Negative for fever. Eyes: Negative for visual changes. ENT: Negative for sore throat. Neck: No neck pain  Cardiovascular: Negative for chest pain. Respiratory: Negative for shortness of breath. Gastrointestinal: Negative for abdominal pain, vomiting or diarrhea. Genitourinary: Negative for dysuria. Musculoskeletal: Negative for back pain. Skin: Negative for rash. Neurological: Negative for headaches, weakness or numbness. Psych: No SI or HI  ____________________________________________   PHYSICAL EXAM:  VITAL SIGNS: ED Triage Vitals  Enc Vitals Group     BP 04/13/20 0008 (!) 192/117     Pulse Rate 04/13/20 0004 70     Resp 04/13/20 0004 20     Temp 04/13/20 0004 98.4 F (36.9 C)     Temp Source 04/13/20 0004 Oral     SpO2  04/13/20 0004 100 %     Weight --      Height --      Head Circumference --      Peak Flow --      Pain Score 04/13/20 0009 0     Pain Loc --      Pain Edu? --      Excl. in [place]? --     Constitutional: Alert and oriented. Well appearing and in no apparent distress. HEENT:      Head: Normocephalic and atraumatic.         Eyes: Conjunctivae are normal. Sclera is non-icteric.       Mouth/Throat: Mucous membranes are moist.       Neck: Supple with no signs of meningismus. Cardiovascular: Regular rate and rhythm. No murmurs, gallops, or rubs.  Respiratory: Normal respiratory effort. Lungs are clear to auscultation bilaterally.  Gastrointestinal: Soft, distended, non tender. Musculoskeletal:  No edema, cyanosis, or erythema of extremities. Neurologic: Normal speech and language. Face is symmetric. Moving all extremities. No  gross focal neurologic deficits are appreciated. Skin: Skin is warm, dry and intact. No rash noted. Psychiatric: Mood and affect are normal. Speech and behavior are normal.  ____________________________________________   LABS (all labs ordered are listed, but only abnormal results are displayed)  Labs Reviewed  CBC - Abnormal; Notable for the following components:      Result Value   RBC 3.38 (*)    Hemoglobin 9.4 (*)    HCT 31.0 (*)    RDW 17.9 (*)    All other components within normal limits  COMPREHENSIVE METABOLIC PANEL - Abnormal; Notable for the following components:   CO2 21 (*)    BUN 76 (*)    Creatinine, Ser 6.11 (*)    Calcium 6.1 (*)    Total Protein 5.7 (*)    Albumin 3.3 (*)    GFR, Estimated 10 (*)    All other components within normal limits  RESP PANEL BY RT-PCR (FLU A&B, COVID) [id number]   ____________________________________________  EKG  ED ECG REPORT I, Rudene Re, the attending physician, personally viewed and interpreted this ECG.  Sinus rhythm, rate of 69, occasional PVCs, intraventricular conduction delay, normal QTC, normal axis, no ST elevations or depressions. Unchanged from prior.  ____________________________________________  RADIOLOGY  none ____________________________________________   PROCEDURES  Procedure(s) performed:yes .1-3 Lead EKG Interpretation Performed by: Rudene Re, MD Authorized by: Rudene Re, MD     Interpretation: non-specific     ECG rate assessment: normal     Rhythm: sinus rhythm     Ectopy: none     Critical Care performed:  None ____________________________________________   INITIAL IMPRESSION / ASSESSMENT AND PLAN / ED COURSE   65 y.o. male with a history of ESRD on peritoneal dialysis who presents from home for a malfunctioning of his equipment.  Patient unable to have dialysis over the last 2 nights.  No indication for emergent dialysis at this time with no hypoxia, no shortness  of breath, no hyperkalemia, no severe acidosis.  Patient does have distended abdomen but no tenderness throughout.  Denies any signs of infectious etiology.  Discussed with Dr. Ara Kussmaul who will admit patient for dialysis in the morning.  Patient placed on telemetry for close monitoring.      _____________________________________________ Please note:  Patient was evaluated in Emergency Department today for the symptoms described in the history of present illness. Patient was evaluated in the context of the global COVID-19 pandemic, which necessitated  consideration that the patient might be at risk for infection with the SARS-CoV-2 virus that causes COVID-19. Institutional protocols and algorithms that pertain to the evaluation of patients at risk for COVID-19 are in a state of rapid change based on information released by regulatory bodies including the CDC and federal and state organizations. These policies and algorithms were followed during the patient's care in the ED.  Some ED evaluations and interventions may be delayed as a result of limited staffing during the pandemic.   [place] Controlled Substance Database was reviewed by me. ____________________________________________   FINAL CLINICAL IMPRESSION(S) / ED DIAGNOSES   Final diagnoses:  Complication of peritoneal dialysis, initial encounter      NEW MEDICATIONS STARTED DURING THIS VISIT:  ED Discharge Orders    None       Note:  This document was prepared using Dragon voice recognition software and may include unintentional dictation errors.    Alfred Levins, [place], MD 04/13/20 [phone]

## 2020-04-13 NOTE — Progress Notes (Signed)
Placed Pt on PD for trail 4 hour treatment to assess if PD cath is draining properly.

## 2020-04-14 ENCOUNTER — Encounter: Admission: RE | Payer: Self-pay | Source: Home / Self Care

## 2020-04-14 ENCOUNTER — Other Ambulatory Visit (INDEPENDENT_AMBULATORY_CARE_PROVIDER_SITE_OTHER): Payer: Self-pay | Admitting: Nurse Practitioner

## 2020-04-14 ENCOUNTER — Ambulatory Visit: Admission: RE | Admit: 2020-04-14 | Payer: Medicaid Other | Source: Home / Self Care | Admitting: Vascular Surgery

## 2020-04-14 DIAGNOSIS — N186 End stage renal disease: Secondary | ICD-10-CM

## 2020-04-14 SURGERY — DIALYSIS/PERMA CATHETER INSERTION
Anesthesia: Moderate Sedation

## 2020-04-14 NOTE — Discharge Summary (Signed)
Physician Discharge Summary  Anthony Hess TMH:[id number] DOB: 06-Aug-1955 DOA: 04/13/2020  PCP: Jon Billings, NP  Admit date: 04/13/2020 Discharge date: 04/14/2020  Admitted From: Home Disposition: Home  Recommendations for Outpatient Follow-up:  1. Follow up with PCP in 1-2 weeks 2. Please obtain BMP/CBC in one week your next doctors visit.  3. Follow-up patient nephrology   Discharge Condition: Stable CODE STATUS: Full Diet recommendation: Heart healthy/renal  Brief/Interim Summary: 65 year old with history of ESRD on PD at home, CAD status post MI, HTN, bladder cancer came to the ER with complains of malfunctioning PD reported of abdominal distention and bloating.  Missed two full nights of PD.  Admitted with concerns of malfunctioning PD equipment. This was adjusted by Nephro and received a session in the hospital. Did well, and was discharged home in stable condition.   Assessment & Plan:   Active Problems:   Peritoneal dialysis catheter mechanical complication (HCC)  ESRD on PD at home Malfunctioning PD equipment -Nephrology team was consulted, his PD catheter was fixed and received a session in the hospital..  TUnneled catheter placed on 2/17 due to obstruction/dysfunction of PD catheter.  Discharged in stable condition after  Hypocalcemia -Might need renal meds adjusted, defer to outpatient nephrology.  Essential hypertension   -Norvasc and metoprolol  CAD -Chest pain-free.  Aspirin statin  Hyperlipidemia -On statin    There is no height or weight on file to calculate BMI.         Discharge Diagnoses:  Active Problems:   Peritoneal dialysis catheter mechanical complication [facility])      Consultations:  Nephrology  Subjective: Felt great no complaints  Discharge Exam: Vitals:   04/13/20 1220 04/13/20 1546  BP: (!) 191/115 (!) 167/101  Pulse: 65 66  Resp: 16   Temp: 98.5 F (36.9 C) 97.6 F (36.4 C)  SpO2: 98% 100%    Vitals:   04/13/20 0502 04/13/20 0814 04/13/20 1220 04/13/20 1546  BP: (!) 176/100 (!) 155/107 (!) 191/115 (!) 167/101  Pulse: 63 76 65 66  Resp: 18 15 16    Temp: 97.8 F (36.6 C) 98.1 F (36.7 C) 98.5 F (36.9 C) 97.6 F (36.4 C)  TempSrc: Oral  Oral Oral  SpO2: 96% 97% 98% 100%    General: Pt is alert, awake, not in acute distress Cardiovascular: RRR, S1/S2 +, no rubs, no gallops Respiratory: CTA bilaterally, no wheezing, no rhonchi Abdominal: Soft, NT, ND, bowel sounds + Extremities: no edema, no cyanosis  Discharge Instructions   Allergies as of 04/13/2020   No Known Allergies     Medication List    TAKE these medications   amLODipine 10 MG tablet Commonly known as: NORVASC Take 1 tablet (10 mg total) by mouth daily.   aspirin EC 81 MG tablet Take 81 mg by mouth daily.   atorvastatin 40 MG tablet Commonly known as: LIPITOR Take 1 tablet (40 mg total) by mouth daily.   calcitRIOL 0.5 MCG capsule Commonly known as: ROCALTROL Take 1 capsule (0.5 mcg total) by mouth daily.   calcium acetate 667 MG capsule Commonly known as: PHOSLO Take 2 capsules (1,334 mg total) by mouth 3 (three) times daily with meals.   calcium carbonate 1250 (500 Ca) MG tablet Commonly known as: OS-CAL - dosed in mg of elemental calcium Take 1 tablet (1,250 mg total) by mouth 2 (two) times daily between meals.   HYDROcodone-acetaminophen 5-325 MG tablet Commonly known as: NORCO/VICODIN Take 1 tablet by mouth every 6 (six) hours as needed for  moderate pain.   metoprolol succinate 50 MG 24 hr tablet Commonly known as: TOPROL-XL Take 1 tablet (50 mg total) by mouth daily. Take with or immediately following a meal.   Vitamin D (Ergocalciferol) 1.25 MG (50000 UNIT) Caps capsule Commonly known as: DRISDOL Take 1 capsule (50,000 Units total) by mouth every 7 (seven) days. What changed: when to take this       No Known Allergies  You were cared for by a hospitalist during your  hospital stay. If you have any questions about your discharge medications or the care you received while you were in the hospital after you are discharged, you can call the unit and asked to speak with the hospitalist on call if the hospitalist that took care of you is not available. Once you are discharged, your primary care physician will handle any further medical issues. Please note that no refills for any discharge medications will be authorized once you are discharged, as it is imperative that you return to your primary care physician (or establish a relationship with a primary care physician if you do not have one) for your aftercare needs so that they can reassess your need for medications and monitor your lab values.   Procedures/Studies: DG Abd 1 View  Result Date: 03/27/2020 CLINICAL DATA:  65 year old male with malfunctioning peritoneal dialysis catheter. EXAM: ABDOMEN - 1 VIEW COMPARISON:  None. FINDINGS: . No dialysis catheter positioned inserted into the peritoneum via the the left lower quadrant with the distal tip also coiled in the left lower quadrant. No evidence of kinking or catheter disruption. The bowel gas pattern is normal. No radio-opaque calculi or other significant radiographic abnormality are seen. IMPRESSION: No radiographic abnormality of the left lower quadrant indwelling peritoneal dialysis catheter. Consider catheter check and possible manipulation by Interventional Radiology as clinically indicated. Ruthann Cancer, MD [facility] Radiology Electronically Signed   By: Ruthann Cancer MD   On: 03/27/2020 16:13   DG C-Arm 1-60 Min-No Report  Result Date: 04/10/2020 Fluoroscopy was utilized by the requesting physician.  No radiographic interpretation.   IR IMAGE GUIDED DRAINAGE PERCUT CATH  PERITONEAL RETROPERIT  Result Date: 04/07/2020 INDICATION: 65 year old gentleman with poorly functioning peritoneal dialysis catheter  presents to interventional radiology for manipulation. EXAM: Peritoneal dialysis catheter manipulation MEDICATIONS: Ancef 1 g IV ANESTHESIA/SEDATION: None COMPLICATIONS: None immediate. PROCEDURE: Informed written consent was obtained from the patient after a thorough discussion of the procedural risks, benefits and alternatives. All questions were addressed. Maximal Sterile Barrier Technique was utilized including caps, mask, sterile gowns, sterile gloves, sterile drape, hand hygiene and skin antiseptic. A timeout was performed prior to the initiation of the procedure. External segment of the peritoneal dialysis catheter and surrounding skin prepped and draped in usual fashion. The catheter was manipulated under fluoroscopy with stiff glide and superstiff Amplatz guidewires. Although the overall position of the pigtail did not change, there was improved drainage at the end the procedure. Drain flushed and capped. IMPRESSION: Fluoroscopic manipulation of peritoneal dialysis catheter as above. Electronically Signed   By: Miachel Roux M.D.   On: 04/07/2020 16:30      The results of significant diagnostics from this hospitalization (including imaging, microbiology, ancillary and laboratory) are listed below for reference.     Microbiology: Recent Results (from the past 240 hour(s))  SARS CORONAVIRUS 2 (TAT 6-24 HRS) Nasopharyngeal Nasopharyngeal Swab     Status: None   Collection Time: 04/09/20  2:00 PM   Specimen: Nasopharyngeal  Swab  Result Value Ref Range Status   SARS Coronavirus 2 NEGATIVE NEGATIVE Final    Comment: (NOTE) SARS-CoV-2 target nucleic acids are NOT DETECTED.  The SARS-CoV-2 RNA is generally detectable in upper and lower respiratory specimens during the acute phase of infection. Negative results do not preclude SARS-CoV-2 infection, do not rule out co-infections with other pathogens, and should not be used as the sole basis for treatment or other patient management  decisions. Negative results must be combined with clinical observations, patient history, and epidemiological information. The expected result is Negative.  Fact Sheet for Patients: SugarRoll.be  Fact Sheet for Healthcare Providers: https://www.woods-mathews.com/  This test is not yet approved or cleared by the [place] FDA and  has been authorized for detection and/or diagnosis of SARS-CoV-2 by FDA under an Emergency Use Authorization (EUA). This EUA will remain  in effect (meaning this test can be used) for the duration of the COVID-19 declaration under Se ction 564(b)(1) of the Act, 21 U.S.C. section 360bbb-3(b)(1), unless the authorization is terminated or revoked sooner.  Performed at [facility], [place] [address], [place], [place] [address]   Resp Panel by RT-PCR (Flu A&B, Covid) Nasopharyngeal Swab     Status: None   Collection Time: 04/13/20 12:08 AM   Specimen: Nasopharyngeal Swab; Nasopharyngeal(NP) swabs in vial transport medium  Result Value Ref Range Status   SARS Coronavirus 2 by RT PCR NEGATIVE NEGATIVE Final    Comment: (NOTE) SARS-CoV-2 target nucleic acids are NOT DETECTED.  The SARS-CoV-2 RNA is generally detectable in upper respiratory specimens during the acute phase of infection. The lowest concentration of SARS-CoV-2 viral copies this assay can detect is 138 copies/mL. A negative result does not preclude SARS-Cov-2 infection and should not be used as the sole basis for treatment or other patient management decisions. A negative result may occur with  improper specimen collection/handling, submission of specimen other than nasopharyngeal swab, presence of viral mutation(s) within the areas targeted by this assay, and inadequate number of viral copies(<138 copies/mL). A negative result must be combined with clinical observations, patient history, and epidemiological information. The expected result is  Negative.  Fact Sheet for Patients:  EntrepreneurPulse.com.au  Fact Sheet for Healthcare Providers:  IncredibleEmployment.be  This test is no t yet approved or cleared by the [place] FDA and  has been authorized for detection and/or diagnosis of SARS-CoV-2 by FDA under an Emergency Use Authorization (EUA). This EUA will remain  in effect (meaning this test can be used) for the duration of the COVID-19 declaration under Section 564(b)(1) of the Act, 21 U.S.C.section 360bbb-3(b)(1), unless the authorization is terminated  or revoked sooner.       Influenza A by PCR NEGATIVE NEGATIVE Final   Influenza B by PCR NEGATIVE NEGATIVE Final    Comment: (NOTE) The Xpert Xpress SARS-CoV-2/FLU/RSV plus assay is intended as an aid in the diagnosis of influenza from Nasopharyngeal swab specimens and should not be used as a sole basis for treatment. Nasal washings and aspirates are unacceptable for Xpert Xpress SARS-CoV-2/FLU/RSV testing.  Fact Sheet for Patients: EntrepreneurPulse.com.au  Fact Sheet for Healthcare Providers: IncredibleEmployment.be  This test is not yet approved or cleared by the [place] FDA and has been authorized for detection and/or diagnosis of SARS-CoV-2 by FDA under an Emergency Use Authorization (EUA). This EUA will remain in effect (meaning this test can be used) for the duration of the COVID-19 declaration under Section 564(b)(1) of the Act, 21 U.S.C. section 360bbb-3(b)(1), unless  the authorization is terminated or revoked.  Performed at [facility], [address] [place], [place] [address]      Labs: BNP (last 3 results) Recent Labs    12/27/19 1108  BNP 6,712.4*   Basic Metabolic Panel: Recent Labs  Lab 04/10/20 1326 04/13/20 0000 04/13/20 0008 04/13/20 0450  NA 145  --  142 144  K 4.4  --  4.6 4.8  CL 116*  --  111 114*  CO2  --   --  21* 19*   GLUCOSE 82  --  94 83  BUN 50*  --  76* 74*  CREATININE 6.50* 6.19* 6.11* 6.07*  CALCIUM  --   --  6.1* 6.1*   Liver Function Tests: Recent Labs  Lab 04/13/20 0008 04/13/20 0450  AST 20 15  ALT <5 <5  ALKPHOS 42 38  BILITOT 0.6 0.6  PROT 5.7* 5.2*  ALBUMIN 3.3* 2.8*   No results for input(s): LIPASE, AMYLASE in the last 168 hours. No results for input(s): AMMONIA in the last 168 hours. CBC: Recent Labs  Lab 04/10/20 1326 04/13/20 0000 04/13/20 0008 04/13/20 0450  WBC  --  7.2 8.1 6.2  HGB 10.9* 8.9* 9.4* 9.0*  HCT 32.0* 29.3* 31.0* 29.2*  MCV  --  91.3 91.7 91.0  PLT  --  199 214 196   Cardiac Enzymes: No results for input(s): CKTOTAL, CKMB, CKMBINDEX, TROPONINI in the last 168 hours. BNP: Invalid input(s): POCBNP CBG: No results for input(s): GLUCAP in the last 168 hours. D-Dimer No results for input(s): DDIMER in the last 72 hours. Hgb A1c No results for input(s): HGBA1C in the last 72 hours. Lipid Profile No results for input(s): CHOL, HDL, LDLCALC, TRIG, CHOLHDL, LDLDIRECT in the last 72 hours. Thyroid function studies No results for input(s): TSH, T4TOTAL, T3FREE, THYROIDAB in the last 72 hours.  Invalid input(s): FREET3 Anemia work up No results for input(s): VITAMINB12, FOLATE, FERRITIN, TIBC, IRON, RETICCTPCT in the last 72 hours. Urinalysis    Component Value Date/Time   COLORURINE STRAW (A) 12/27/2019 1756   APPEARANCEUR CLEAR (A) 12/27/2019 1756   APPEARANCEUR Cloudy (A) 05/08/2019 1448   LABSPEC 1.010 12/27/2019 1756   PHURINE 5.0 12/27/2019 1756   GLUCOSEU NEGATIVE 12/27/2019 1756   HGBUR NEGATIVE 12/27/2019 1756   BILIRUBINUR NEGATIVE 12/27/2019 1756   BILIRUBINUR Negative 05/08/2019 1448   KETONESUR NEGATIVE 12/27/2019 1756   PROTEINUR >=300 (A) 12/27/2019 1756   NITRITE NEGATIVE 12/27/2019 1756   LEUKOCYTESUR NEGATIVE 12/27/2019 1756   Sepsis Labs Invalid input(s): PROCALCITONIN,  WBC,  LACTICIDVEN Microbiology Recent Results  (from the past 240 hour(s))  SARS CORONAVIRUS 2 (TAT 6-24 HRS) Nasopharyngeal Nasopharyngeal Swab     Status: None   Collection Time: 04/09/20  2:00 PM   Specimen: Nasopharyngeal Swab  Result Value Ref Range Status   SARS Coronavirus 2 NEGATIVE NEGATIVE Final    Comment: (NOTE) SARS-CoV-2 target nucleic acids are NOT DETECTED.  The SARS-CoV-2 RNA is generally detectable in upper and lower respiratory specimens during the acute phase of infection. Negative results do not preclude SARS-CoV-2 infection, do not rule out co-infections with other pathogens, and should not be used as the sole basis for treatment or other patient management decisions. Negative results must be combined with clinical observations, patient history, and epidemiological information. The expected result is Negative.  Fact Sheet for Patients: SugarRoll.be  Fact Sheet for Healthcare Providers: https://www.woods-mathews.com/  This test is not yet approved or cleared by the [place]  FDA and  has been authorized for detection and/or diagnosis of SARS-CoV-2 by FDA under an Emergency Use Authorization (EUA). This EUA will remain  in effect (meaning this test can be used) for the duration of the COVID-19 declaration under Se ction 564(b)(1) of the Act, 21 U.S.C. section 360bbb-3(b)(1), unless the authorization is terminated or revoked sooner.  Performed at [facility], [place] [address], [place], [place] [address]   Resp Panel by RT-PCR (Flu A&B, Covid) Nasopharyngeal Swab     Status: None   Collection Time: 04/13/20 12:08 AM   Specimen: Nasopharyngeal Swab; Nasopharyngeal(NP) swabs in vial transport medium  Result Value Ref Range Status   SARS Coronavirus 2 by RT PCR NEGATIVE NEGATIVE Final    Comment: (NOTE) SARS-CoV-2 target nucleic acids are NOT DETECTED.  The SARS-CoV-2 RNA is generally detectable in upper respiratory specimens during the acute phase of  infection. The lowest concentration of SARS-CoV-2 viral copies this assay can detect is 138 copies/mL. A negative result does not preclude SARS-Cov-2 infection and should not be used as the sole basis for treatment or other patient management decisions. A negative result may occur with  improper specimen collection/handling, submission of specimen other than nasopharyngeal swab, presence of viral mutation(s) within the areas targeted by this assay, and inadequate number of viral copies(<138 copies/mL). A negative result must be combined with clinical observations, patient history, and epidemiological information. The expected result is Negative.  Fact Sheet for Patients:  EntrepreneurPulse.com.au  Fact Sheet for Healthcare Providers:  IncredibleEmployment.be  This test is no t yet approved or cleared by the [place] FDA and  has been authorized for detection and/or diagnosis of SARS-CoV-2 by FDA under an Emergency Use Authorization (EUA). This EUA will remain  in effect (meaning this test can be used) for the duration of the COVID-19 declaration under Section 564(b)(1) of the Act, 21 U.S.C.section 360bbb-3(b)(1), unless the authorization is terminated  or revoked sooner.       Influenza A by PCR NEGATIVE NEGATIVE Final   Influenza B by PCR NEGATIVE NEGATIVE Final    Comment: (NOTE) The Xpert Xpress SARS-CoV-2/FLU/RSV plus assay is intended as an aid in the diagnosis of influenza from Nasopharyngeal swab specimens and should not be used as a sole basis for treatment. Nasal washings and aspirates are unacceptable for Xpert Xpress SARS-CoV-2/FLU/RSV testing.  Fact Sheet for Patients: EntrepreneurPulse.com.au  Fact Sheet for Healthcare Providers: IncredibleEmployment.be  This test is not yet approved or cleared by the [place] FDA and has been authorized for detection and/or diagnosis of SARS-CoV-2  by FDA under an Emergency Use Authorization (EUA). This EUA will remain in effect (meaning this test can be used) for the duration of the COVID-19 declaration under Section 564(b)(1) of the Act, 21 U.S.C. section 360bbb-3(b)(1), unless the authorization is terminated or revoked.  Performed at [facility], [address] [place], [place] [address]      Time coordinating discharge:  I have spent 35 minutes face to face with the patient and on the ward discussing the patients care, assessment, plan and disposition with other care givers. >50% of the time was devoted counseling the patient about the risks and benefits of treatment/Discharge disposition and coordinating care.   SIGNED:   Damita Lack, MD  [facility] 04/14/2020, 12:10 PM   If 7PM-7AM, please contact night-coverage

## 2020-04-14 NOTE — Telephone Encounter (Signed)
Encounter opened in error

## 2020-04-15 DIAGNOSIS — N186 End stage renal disease: Secondary | ICD-10-CM

## 2020-04-21 ENCOUNTER — Ambulatory Visit
Admission: RE | Admit: 2020-04-21 | Discharge: 2020-04-21 | Disposition: A | Discharge status: Home / Self Care | Payer: Medicare Other | Attending: Nephrology | Admitting: Nephrology

## 2020-04-21 ENCOUNTER — Other Ambulatory Visit: Payer: Self-pay

## 2020-04-21 ENCOUNTER — Ambulatory Visit
Admission: RE | Admit: 2020-04-21 | Discharge: 2020-04-21 | Disposition: A | Discharge status: Home / Self Care | Payer: Medicare Other | Source: Ambulatory Visit | Attending: Nephrology | Admitting: Nephrology

## 2020-04-21 ENCOUNTER — Other Ambulatory Visit: Payer: Self-pay | Admitting: Nephrology

## 2020-04-21 DIAGNOSIS — N186 End stage renal disease: Secondary | ICD-10-CM

## 2020-05-06 DIAGNOSIS — R17 Unspecified jaundice: Secondary | ICD-10-CM | POA: Insufficient documentation

## 2020-05-06 DIAGNOSIS — E440 Moderate protein-calorie malnutrition: Secondary | ICD-10-CM | POA: Insufficient documentation

## 2020-05-06 DIAGNOSIS — Z79899 Other long term (current) drug therapy: Secondary | ICD-10-CM | POA: Insufficient documentation

## 2020-05-06 DIAGNOSIS — D509 Iron deficiency anemia, unspecified: Secondary | ICD-10-CM | POA: Insufficient documentation

## 2020-05-07 ENCOUNTER — Telehealth: Payer: Medicaid Other | Admitting: Surgery

## 2020-05-27 ENCOUNTER — Telehealth (INDEPENDENT_AMBULATORY_CARE_PROVIDER_SITE_OTHER): Payer: Self-pay

## 2020-05-27 NOTE — Telephone Encounter (Signed)
Spoke with the patient and he is scheduled with Dr. Lucky Cowboy for a permcath removal on 06/02/20 with a 9:30 am arrival time to the [facility]. Covid testing on 05/29/20 between 8-2 pm at the [facility]. Pare-procedure instructions were discussed and will be mailed.

## 2020-05-29 ENCOUNTER — Other Ambulatory Visit: Payer: Self-pay

## 2020-05-29 ENCOUNTER — Other Ambulatory Visit
Admission: RE | Admit: 2020-05-29 | Discharge: 2020-05-29 | Disposition: A | Discharge status: Home / Self Care | Payer: Medicare Other | Source: Ambulatory Visit | Attending: Vascular Surgery | Admitting: Vascular Surgery

## 2020-05-29 DIAGNOSIS — Z20822 Contact with and (suspected) exposure to covid-19: Secondary | ICD-10-CM | POA: Diagnosis not present

## 2020-05-29 DIAGNOSIS — Z01812 Encounter for preprocedural laboratory examination: Secondary | ICD-10-CM | POA: Diagnosis present

## 2020-05-30 LAB — SARS CORONAVIRUS 2 (TAT 6-24 HRS): SARS Coronavirus 2: NEGATIVE

## 2020-06-02 ENCOUNTER — Ambulatory Visit
Admission: RE | Admit: 2020-06-02 | Discharge: 2020-06-02 | Disposition: A | Discharge status: Home / Self Care | Payer: Medicare Other | Attending: Vascular Surgery | Admitting: Vascular Surgery

## 2020-06-02 ENCOUNTER — Other Ambulatory Visit: Payer: Self-pay

## 2020-06-02 ENCOUNTER — Encounter
Admission: RE | Disposition: A | Discharge status: Home / Self Care | Payer: Self-pay | Source: Home / Self Care | Attending: Vascular Surgery

## 2020-06-02 ENCOUNTER — Other Ambulatory Visit (INDEPENDENT_AMBULATORY_CARE_PROVIDER_SITE_OTHER): Payer: Self-pay | Admitting: Nurse Practitioner

## 2020-06-02 ENCOUNTER — Encounter: Payer: Self-pay | Admitting: Vascular Surgery

## 2020-06-02 DIAGNOSIS — Z951 Presence of aortocoronary bypass graft: Secondary | ICD-10-CM | POA: Insufficient documentation

## 2020-06-02 DIAGNOSIS — Z452 Encounter for adjustment and management of vascular access device: Secondary | ICD-10-CM | POA: Diagnosis not present

## 2020-06-02 DIAGNOSIS — N186 End stage renal disease: Secondary | ICD-10-CM | POA: Insufficient documentation

## 2020-06-02 DIAGNOSIS — Z4901 Encounter for fitting and adjustment of extracorporeal dialysis catheter: Secondary | ICD-10-CM | POA: Diagnosis present

## 2020-06-02 DIAGNOSIS — N185 Chronic kidney disease, stage 5: Secondary | ICD-10-CM | POA: Diagnosis not present

## 2020-06-02 DIAGNOSIS — Z87891 Personal history of nicotine dependence: Secondary | ICD-10-CM | POA: Insufficient documentation

## 2020-06-02 DIAGNOSIS — Z8551 Personal history of malignant neoplasm of bladder: Secondary | ICD-10-CM | POA: Diagnosis not present

## 2020-06-02 DIAGNOSIS — I120 Hypertensive chronic kidney disease with stage 5 chronic kidney disease or end stage renal disease: Secondary | ICD-10-CM | POA: Diagnosis not present

## 2020-06-02 DIAGNOSIS — I252 Old myocardial infarction: Secondary | ICD-10-CM | POA: Diagnosis not present

## 2020-06-02 DIAGNOSIS — Z992 Dependence on renal dialysis: Secondary | ICD-10-CM | POA: Diagnosis not present

## 2020-06-02 DIAGNOSIS — I2510 Atherosclerotic heart disease of native coronary artery without angina pectoris: Secondary | ICD-10-CM | POA: Diagnosis not present

## 2020-06-02 HISTORY — PX: DIALYSIS/PERMA CATHETER REMOVAL: CATH118289

## 2020-06-02 SURGERY — DIALYSIS/PERMA CATHETER REMOVAL
Anesthesia: LOCAL

## 2020-06-02 MED ORDER — LIDOCAINE-EPINEPHRINE (PF) 1 %-1:200000 IJ SOLN
INTRAMUSCULAR | Status: DC | PRN
  Administered 2020-06-02: 10 mL via INTRADERMAL

## 2020-06-02 SURGICAL SUPPLY — 3 items
CHLORAPREP W/TINT 26 (MISCELLANEOUS) ×4 IMPLANT
FORCEPS HALSTEAD CVD 5IN STRL (INSTRUMENTS) ×2 IMPLANT
TRAY LACERAT/PLASTIC (MISCELLANEOUS) ×2 IMPLANT

## 2020-06-02 NOTE — Op Note (Signed)
Operative Note  Preoperative diagnosis:    1. ESRD with functional permanent access  Postoperative diagnosis:   1. ESRD with functional permanent access  Procedure:  Removal of right Permcath  Physician Assistant: Hezzie Bump PA-C  Surgeon:  [name], MD  Anesthesia:  Local  EBL:  Minimal  Indication for the Procedure:  The patient has a functional permanent dialysis access and no longer needs their permcath.  This can be removed.  Risks and benefits are discussed and informed consent is obtained.  Description of the Procedure:  The patient's right neck, chest and existing catheter were sterilely prepped and draped. The area around the catheter was anesthetized copiously with 1% lidocaine. The catheter was dissected out with curved hemostats until the cuff was freed from the surrounding fibrous sheath. The fiber sheath was transected, and the catheter was then removed in its entirety using gentle traction. Pressure was held and sterile dressings were placed. The patient tolerated the procedure well and was taken to the recovery room in stable condition.  Jaleigh Mccroskey A Amoni Scallan  06/02/2020, 10:44 AM  This note was created with Dragon Medical transcription system. Any errors in dictation are purely unintentional.

## 2020-06-02 NOTE — H&P (Signed)
[facility] Admission History & Physical  MRN : [id number]  Anthony Hess is a 65 y.o. (10/23/1955) male who presents with chief complaint of scheduled PermCath removal.  History of Present Illness:  I am asked to evaluate the patient by his nephrologist. The patient was sent here because they have a functioning peritoneal dialysis catheter is no longer need of his PermCath.  The patient reports they're not been any problems with any of their dialysis runs. They are reporting good flows with good parameters at dialysis. Patient denies pain or tenderness overlying the access.  There is no pain with dialysis. No fevers or chills while on dialysis.  Patient presents for a scheduled PermCath removal.  No current facility-administered medications for this encounter.   Past Medical History:  Diagnosis Date  . Bladder tumor   . Cancer [facility])    Bladder  . Chronic kidney disease    renal insufficiency  . Coronary artery disease   . History of kidney stones   . Hypertension   . Myocardial infarction ([place])   . Wears glasses    Past Surgical History:  Procedure Laterality Date  . CAPD INSERTION N/A 12/31/2019   Procedure: LAPAROSCOPIC INSERTION CONTINUOUS AMBULATORY PERITONEAL DIALYSIS  (CAPD) CATHETER;  Surgeon: Jules Husbands, MD;  Location: [facility];  Service: General;  Laterality: N/A;  . CAPD REMOVAL N/A 04/10/2020   Procedure: LAPAROSCOPIC REVISION OF CONTINUOUS AMBULATORY PERITONEAL DIALYSIS  (CAPD) CATHETER;  Surgeon: Jules Husbands, MD;  Location: [facility];  Service: General;  Laterality: N/A;  . CORONARY ARTERY BYPASS GRAFT N/A 12/27/2018   Procedure: CORONARY ARTERY BYPASS GRAFTING (CABG) X 4 ON PUMP USING RIGHT & LEFT INTERNAL MAMMARY ARTERY LEFT RADIAL ARTERY ENDOSCOPICALLY HARVESTED;  Surgeon: [name], MD;  Location: [place];  Service: Open Heart Surgery;  Laterality: N/A;  . CYSTOSCOPY W/ RETROGRADES Bilateral 05/15/2019   Procedure: CYSTOSCOPY WITH  RETROGRADE PYELOGRAM;  Surgeon: Abbie Sons, MD;  Location: [facility];  Service: Urology;  Laterality: Bilateral;  . CYSTOSCOPY WITH BIOPSY N/A 05/15/2019   Procedure: CYSTOSCOPY WITH bladder BIOPSY;  Surgeon: Abbie Sons, MD;  Location: [facility];  Service: Urology;  Laterality: N/A;  . DIALYSIS/PERMA CATHETER INSERTION N/A 12/28/2019   Procedure: DIALYSIS/PERMA CATHETER INSERTION;  Surgeon: Algernon Huxley, MD;  Location: [place] [facility];  Service: Cardiovascular;  Laterality: N/A;  . EXCHANGE OF A DIALYSIS CATHETER Right 04/10/2020   Procedure: EXCHANGE OF A DIALYSIS CATHETER;  Surgeon: Jules Husbands, MD;  Location: [facility];  Service: General;  Laterality: Right;  . IR IMAGE GUIDED DRAINAGE PERCUT CATH  PERITONEAL RETROPERIT  04/07/2020  . LEFT HEART CATH AND CORONARY ANGIOGRAPHY Left 12/20/2018   Procedure: LEFT HEART CATH AND CORONARY ANGIOGRAPHY;  Surgeon: Isaias Cowman, MD;  Location: [place] [facility];  Service: Cardiovascular;  Laterality: Left;  . RADIAL ARTERY HARVEST Left 12/27/2018   Procedure: ENDOSCOPIC RADIAL ARTERY HARVEST;  Surgeon: [name], MD;  Location: [place];  Service: Open Heart Surgery;  Laterality: Left;  . TEE WITHOUT CARDIOVERSION N/A 12/27/2018   Procedure: TRANSESOPHAGEAL ECHOCARDIOGRAM (TEE);  Surgeon: [name], MD;  Location: [place];  Service: Open Heart Surgery;  Laterality: N/A;  . TUMOR REMOVAL  2019   Bladder   Social History Social History   Tobacco Use  . Smoking status: Former Smoker    Packs/day: 0.25    Types: Cigarettes    Quit date: 02/19/2019    Years since  quitting: 1.2  . Smokeless tobacco: Never Used  Vaping Use  . Vaping Use: Never used  Substance Use Topics  . Alcohol use: Never  . Drug use: Never   Family History Family History  Family history unknown: Yes  No family history of bleeding or clotting disorders, autoimmune disease or porphyria.  No Known Allergies  REVIEW OF SYSTEMS (Negative  unless checked)  Constitutional: [] Weight loss  [] Fever  [] Chills Cardiac: [] Chest pain   [] Chest pressure   [] Palpitations   [] Shortness of breath when laying flat   [] Shortness of breath at rest   [x] Shortness of breath with exertion. Vascular:  [] Pain in legs with walking   [] Pain in legs at rest   [] Pain in legs when laying flat   [] Claudication   [] Pain in feet when walking  [] Pain in feet at rest  [] Pain in feet when laying flat   [] History of DVT   [] Phlebitis   [] Swelling in legs   [] Varicose veins   [] Non-healing ulcers Pulmonary:   [] Uses home oxygen   [] Productive cough   [] Hemoptysis   [] Wheeze  [] COPD   [] Asthma Neurologic:  [] Dizziness  [] Blackouts   [] Seizures   [] History of stroke   [] History of TIA  [] Aphasia   [] Temporary blindness   [] Dysphagia   [] Weakness or numbness in arms   [] Weakness or numbness in legs Musculoskeletal:  [x] Arthritis   [] Joint swelling   [] Joint pain   [] Low back pain Hematologic:  [] Easy bruising  [] Easy bleeding   [] Hypercoagulable state   [x] Anemic  [] Hepatitis Gastrointestinal:  [] Blood in stool   [] Vomiting blood  [] Gastroesophageal reflux/heartburn   [] Difficulty swallowing. Genitourinary:  [x] Chronic kidney disease   [] Difficult urination  [] Frequent urination  [] Burning with urination   [] Blood in urine Skin:  [] Rashes   [] Ulcers   [] Wounds Psychological:  [] History of anxiety   []  History of major depression.  Physical Examination  Vitals:   06/02/20 0959  BP: (!) 136/105  Pulse: 100  Resp: 19  Temp: 97.8 F (36.6 C)  TempSrc: Oral  SpO2: 95%   There is no height or weight on file to calculate BMI. Gen: WD/WN, NAD Head: [place]/AT, No temporalis wasting. Prominent temp pulse not noted. Ear/Nose/Throat: Hearing grossly intact, nares w/o erythema or drainage, oropharynx w/o Erythema/Exudate,  Eyes: Conjunctiva clear, sclera non-icteric Neck: Trachea midline.  No JVD.  Pulmonary:  Good air movement, respirations not labored, no use of  accessory muscles.  Cardiac: RRR, normal S1, S2. Vascular:  Vessel Right Left  Radial Palpable Palpable  Ulnar Not Palpable Not Palpable  Brachial Palpable Palpable  Carotid Palpable, without bruit Palpable, without bruit   Peritoneal dialysis catheter: Intact.  Site is clean and dry.  No signs of infection.  Right IJ PermCath: Intact.  Site is clean and dry.  No signs of infection.  Gastrointestinal: soft, non-tender/non-distended. No guarding/reflex.  Musculoskeletal: M/S 5/5 throughout.  Extremities without ischemic changes.  No deformity or atrophy.  Neurologic: Sensation grossly intact in extremities.  Symmetrical.  Speech is fluent. Motor exam as listed above. Psychiatric: Judgment intact, Mood & affect appropriate for pt's clinical situation. Dermatologic: No rashes or ulcers noted.  No cellulitis or open wounds. Lymph : No Cervical, Axillary, or Inguinal lymphadenopathy.  CBC Lab Results  Component Value Date   WBC 6.2 04/13/2020   HGB 9.0 (L) 04/13/2020   HCT 29.2 (L) 04/13/2020   MCV 91.0 04/13/2020   PLT 196 04/13/2020   BMET    Component Value  Date/Time   NA 144 04/13/2020 0450   NA 143 01/04/2020 1542   K 4.8 04/13/2020 0450   CL 114 (H) 04/13/2020 0450   CO2 19 (L) 04/13/2020 0450   GLUCOSE 83 04/13/2020 0450   BUN 74 (H) 04/13/2020 0450   BUN 52 (H) 01/04/2020 1542   CREATININE 6.07 (H) 04/13/2020 0450   CREATININE 4.62 (H) 01/10/2019 1351   CALCIUM 6.1 (LL) 04/13/2020 0450   GFRNONAA 10 (L) 04/13/2020 0450   GFRAA 15 (L) 01/04/2020 1542   CrCl cannot be calculated (Patient's most recent lab result is older than the maximum 21 days allowed.).  COAG Lab Results  Component Value Date   INR 1.3 (H) 12/27/2018   INR 1.0 12/25/2018   Radiology No results found.  Assessment/Plan 1.  End-stage renal disease requiring hemodialysis: The patient has a peritoneal dialysis catheter which is functioning appropriately. Therefore, the patient will undergo  removal of the tunneled catheter under local anesthesia. The risks and benefits were described to the patient. All questions were answered. The patient agrees to proceed with angiography and intervention.  Patient will continue dialysis therapy without further interruption if a successful intervention is not achieved then a tunneled catheter will be placed. Dialysis has already been arranged. 2.  Hypertension:  Patient will continue medical management; nephrology is following no changes in oral medications. 3.  Coronary artery disease:  EKG will be monitored. Nitrates will be used if needed. The patient's oral cardiac medications will be continued.  Discussed with Dr. Mayme Genta, PA-C  06/02/2020 10:33 AM

## 2020-11-19 ENCOUNTER — Other Ambulatory Visit: Payer: Self-pay | Admitting: Nephrology

## 2020-11-19 DIAGNOSIS — N186 End stage renal disease: Secondary | ICD-10-CM

## 2020-11-19 DIAGNOSIS — K439 Ventral hernia without obstruction or gangrene: Secondary | ICD-10-CM

## 2020-12-05 ENCOUNTER — Ambulatory Visit
Admission: RE | Admit: 2020-12-05 | Discharge: 2020-12-05 | Disposition: A | Discharge status: Home / Self Care | Payer: Medicare Other | Source: Ambulatory Visit | Attending: Nephrology | Admitting: Nephrology

## 2020-12-05 DIAGNOSIS — K439 Ventral hernia without obstruction or gangrene: Secondary | ICD-10-CM | POA: Insufficient documentation

## 2020-12-05 DIAGNOSIS — N186 End stage renal disease: Secondary | ICD-10-CM | POA: Insufficient documentation

## 2020-12-10 DIAGNOSIS — K650 Generalized (acute) peritonitis: Secondary | ICD-10-CM | POA: Diagnosis present

## 2020-12-26 ENCOUNTER — Encounter: Payer: Self-pay | Admitting: Surgery

## 2020-12-26 ENCOUNTER — Other Ambulatory Visit: Payer: Self-pay

## 2020-12-26 ENCOUNTER — Telehealth: Payer: Self-pay

## 2020-12-26 ENCOUNTER — Ambulatory Visit (INDEPENDENT_AMBULATORY_CARE_PROVIDER_SITE_OTHER): Payer: Medicare Other | Admitting: Surgery

## 2020-12-26 VITALS — BP 143/104 | HR 77 | Temp 97.8°F | Ht 64.0 in | Wt 125.0 lb

## 2020-12-26 DIAGNOSIS — T85611A Breakdown (mechanical) of intraperitoneal dialysis catheter, initial encounter: Secondary | ICD-10-CM

## 2020-12-26 DIAGNOSIS — N186 End stage renal disease: Secondary | ICD-10-CM

## 2020-12-26 DIAGNOSIS — Z992 Dependence on renal dialysis: Secondary | ICD-10-CM

## 2020-12-26 NOTE — Patient Instructions (Addendum)
Our surgery scheduler Pamala Hurry will call you within 24-48 hours to get you scheduled. If you have not heard from her after 48 hours, please call our office. You will not need to get Covid tested before surgery and have the blue sheet available when she calls to write down important information.    If you have any concerns or questions, please feel free to call our office.   Ventral Hernia A ventral hernia is a bulge of tissue from inside the abdomen that pushes through a weak area of the muscles that form the front wall of the abdomen. The tissues inside the abdomen are inside a sac (peritoneum). These tissues include the small intestine, large intestine, and the fatty tissue that covers the intestines (omentum). Sometimes, the bulge that forms a hernia contains intestines. Other hernias contain only fat. Ventral hernias do not go away without surgical treatment. There are several types of ventral hernias. You may have: A hernia at an incision site from previous abdominal surgery (incisional hernia). A hernia just above the belly button (epigastric hernia), or at the belly button (umbilical hernia). These types of hernias can develop from heavy lifting or straining. A hernia that comes and goes (reducible hernia). It may be visible only when you lift or strain. This type of hernia can be pushed back into the abdomen (reduced). A hernia that traps abdominal tissue inside the hernia (incarcerated hernia). This type of hernia does not reduce. A hernia that cuts off blood flow to the tissues inside the hernia (strangulated hernia). The tissues can start to die if this happens. This is a very painful bulge that cannot be reduced. A strangulated hernia is a medical emergency. What are the causes? This condition is caused by abdominal tissue putting pressure on an area of weakness in the abdominal muscles. What increases the risk? The following factors may make you more likely to develop this  condition: Being age 40 or older. Being overweight or obese. Having had previous abdominal surgery, especially if there was an infection after surgery. Having had an injury to the abdominal wall. Frequently lifting or pushing heavy objects. Having had several pregnancies. Having a buildup of fluid inside the abdomen (ascites). Straining to have a bowel movement or to urinate. Having frequent coughing episodes. What are the signs or symptoms? The only symptom of a ventral hernia may be a painless bulge in the abdomen. A reducible hernia may be visible only when you strain, cough, or lift. Other symptoms may include: Dull pain. A feeling of pressure. Signs and symptoms of a strangulated hernia may include: Increasing pain. Nausea and vomiting. Pain when pressing on the hernia. The skin over the hernia turning red or purple. Constipation. Blood in the stool (feces). How is this diagnosed? This condition may be diagnosed based on: Your symptoms. Your medical history. A physical exam. You may be asked to cough or strain while standing. These actions increase the pressure inside your abdomen and force the hernia through the opening in your muscles. Your health care provider may try to reduce the hernia by gently pushing the hernia back in. Imaging studies, such as an ultrasound or CT scan. How is this treated? This condition is treated with surgery. If you have a strangulated hernia, surgery is done as soon as possible. If your hernia is small and not incarcerated, you may be asked to lose some weight before surgery. Follow these instructions at home: Follow instructions from your health care provider about eating or drinking  restrictions. If you are overweight, your health care provider may recommend that you increase your activity level and eat a healthier diet. Do not lift anything that is heavier than 10 lb (4.5 kg), or the limit that you are told, until your health care provider says  that it is safe. Return to your normal activities as told by your health care provider. Ask your health care provider what activities are safe for you. You may need to avoid activities that increase pressure on your hernia. Take over-the-counter and prescription medicines only as told by your health care provider. Keep all follow-up visits. This is important. Contact a health care provider if: Your hernia gets larger. Your hernia becomes painful. Get help right away if: Your hernia becomes increasingly painful. You have pain along with any of the following: Changes in skin color in the area of the hernia. Nausea. Vomiting. Fever. These symptoms may represent a serious problem that is an emergency. Do not wait to see if the symptoms will go away. Get medical help right away. Call your local emergency services (911 in the [place] Do not drive yourself to the hospital. Summary A ventral hernia is a bulge of tissue from inside the abdomen that pushes through a weak area of the muscles that form the front wall of the abdomen. This condition is treated with surgery, which may be urgent depending on your hernia. Do not lift anything that is heavier than 10 lb (4.5 kg), and follow activity instructions from your health care provider. This information is not intended to replace advice given to you by your health care provider. Make sure you discuss any questions you have with your health care provider. Document Revised: 09/28/2019 Document Reviewed: 09/28/2019 Elsevier Patient Education  [place].

## 2020-12-26 NOTE — Telephone Encounter (Signed)
A Cardiac Clearance has been faxed to Dr. Raynald Blend at [phone].

## 2020-12-26 NOTE — Progress Notes (Signed)
12/26/2020  History of Present Illness: Anthony Hess is a 65 y.o. male presenting for evaluation of possible ventral hernias and PD catheter issues.  He has a complex history with his PD catheter.  He has overall had 4 surgeries including replacement and also revisions due to malfunction or infection.  He had two surgeries with Dr. Dahlia Byes and two surgeries with Dr. Raul Del at [facility].  Most recently, he reports that he feels the catheter bulges at the insertion site through the rectus muscle and felt he had a hernia there.  He specifically mentions that he feels the catheter is "migrating".  He also had a recent bacterial peritonitis episode with fluid cultures showing Enterococcus and Klebsiella and he just completed treatment for it.  He had a CT scan on 12/05/20 which shows two small ventral hernias, but not at the site of concern.  He had a small 4 mm defect about 5 cm superior to the insertion site just to the right of midline, and an even smaller defect in the RUQ.  However, the patient denies any issues with those two areas and was not aware of any hernia in those locations.  Reports that the PD catheter currently is working well and has not had any issues with drainage/filtration.  Past Medical History: Past Medical History:  Diagnosis Date   Bladder tumor    Cancer [facility])    Bladder   Chronic kidney disease    renal insufficiency   Coronary artery disease    History of kidney stones    Hypertension    Myocardial infarction [facility])    Wears glasses      Past Surgical History: Past Surgical History:  Procedure Laterality Date   CAPD INSERTION N/A 12/31/2019   Procedure: LAPAROSCOPIC INSERTION CONTINUOUS AMBULATORY PERITONEAL DIALYSIS  (CAPD) CATHETER;  Surgeon: Jules Husbands, MD;  Location: [facility];  Service: General;  Laterality: N/A;   CAPD REMOVAL N/A 04/10/2020   Procedure: LAPAROSCOPIC REVISION OF CONTINUOUS AMBULATORY PERITONEAL DIALYSIS  (CAPD) CATHETER;  Surgeon:  Jules Husbands, MD;  Location: [facility];  Service: General;  Laterality: N/A;   CORONARY ARTERY BYPASS GRAFT N/A 12/27/2018   Procedure: CORONARY ARTERY BYPASS GRAFTING (CABG) X 4 ON PUMP USING RIGHT & LEFT INTERNAL MAMMARY ARTERY LEFT RADIAL ARTERY ENDOSCOPICALLY HARVESTED;  Surgeon: [name], MD;  Location: [place];  Service: Open Heart Surgery;  Laterality: N/A;   CYSTOSCOPY W/ RETROGRADES Bilateral 05/15/2019   Procedure: CYSTOSCOPY WITH RETROGRADE PYELOGRAM;  Surgeon: Abbie Sons, MD;  Location: [facility];  Service: Urology;  Laterality: Bilateral;   CYSTOSCOPY WITH BIOPSY N/A 05/15/2019   Procedure: CYSTOSCOPY WITH bladder BIOPSY;  Surgeon: Abbie Sons, MD;  Location: [facility];  Service: Urology;  Laterality: N/A;   DIALYSIS/PERMA CATHETER INSERTION N/A 12/28/2019   Procedure: DIALYSIS/PERMA CATHETER INSERTION;  Surgeon: Algernon Huxley, MD;  Location: [place] [facility];  Service: Cardiovascular;  Laterality: N/A;   DIALYSIS/PERMA CATHETER REMOVAL N/A 06/02/2020   Procedure: DIALYSIS/PERMA CATHETER REMOVAL;  Surgeon: Algernon Huxley, MD;  Location: [place] [facility];  Service: Cardiovascular;  Laterality: N/A;   EXCHANGE OF A DIALYSIS CATHETER Right 04/10/2020   Procedure: EXCHANGE OF A DIALYSIS CATHETER;  Surgeon: Jules Husbands, MD;  Location: [facility];  Service: General;  Laterality: Right;   IR IMAGE GUIDED DRAINAGE PERCUT CATH  PERITONEAL RETROPERIT  04/07/2020   LEFT HEART CATH AND CORONARY ANGIOGRAPHY Left 12/20/2018   Procedure: LEFT HEART CATH AND CORONARY  ANGIOGRAPHY;  Surgeon: Isaias Cowman, MD;  Location: [place] [facility];  Service: Cardiovascular;  Laterality: Left;   RADIAL ARTERY HARVEST Left 12/27/2018   Procedure: ENDOSCOPIC RADIAL ARTERY HARVEST;  Surgeon: [name], MD;  Location: [place];  Service: Open Heart Surgery;  Laterality: Left;   TEE WITHOUT CARDIOVERSION N/A 12/27/2018   Procedure: TRANSESOPHAGEAL ECHOCARDIOGRAM (TEE);  Surgeon: [name], MD;  Location: [place];  Service: Open Heart Surgery;  Laterality: N/A;   TUMOR REMOVAL  2019   Bladder    Home Medications: Prior to Admission medications   Medication Sig Start Date End Date Taking? Authorizing Provider  aspirin EC 81 MG tablet Take 81 mg by mouth daily.   Yes [name], MD  atorvastatin (LIPITOR) 40 MG tablet Take 1 tablet (40 mg total) by mouth daily. 01/01/20  Yes Fritzi Mandes, MD  calcitRIOL (ROCALTROL) 0.5 MCG capsule Take 1 capsule (0.5 mcg total) by mouth daily. 01/01/20  Yes Fritzi Mandes, MD  calcium acetate (PHOSLO) 667 MG capsule Take 2 capsules (1,334 mg total) by mouth 3 (three) times daily with meals. 01/01/20  Yes Fritzi Mandes, MD  calcium carbonate (OS-CAL - DOSED IN MG OF ELEMENTAL CALCIUM) 1250 (500 Ca) MG tablet Take 1 tablet (1,250 mg total) by mouth 2 (two) times daily between meals. 01/01/20  Yes Fritzi Mandes, MD  metoprolol succinate (TOPROL-XL) 50 MG 24 hr tablet Take 1 tablet (50 mg total) by mouth daily. Take with or immediately following a meal. 01/01/20  Yes Fritzi Mandes, MD  Vitamin D, Ergocalciferol, (DRISDOL) 1.25 MG (50000 UNIT) CAPS capsule Take 1 capsule (50,000 Units total) by mouth every 7 (seven) days. Patient taking differently: Take 50,000 Units by mouth every Saturday. 01/05/20  Yes Fritzi Mandes, MD    Allergies: No Known Allergies  Review of Systems: Review of Systems  Constitutional:  Negative for chills and fever.  HENT:  Negative for hearing loss.   Respiratory:  Negative for shortness of breath.   Cardiovascular:  Negative for chest pain.  Gastrointestinal:  Negative for abdominal pain, constipation, diarrhea, nausea and vomiting.  Genitourinary:  Negative for dysuria.  Musculoskeletal:  Negative for myalgias.  Skin:  Negative for rash.  Neurological:  Negative for dizziness.  Psychiatric/Behavioral:  Negative for depression.    Physical Exam BP (!) 143/104   Pulse 77   Temp 97.8 F (36.6 C) (Oral)   Ht 5\' 4"   (1.626 m)   Wt 125 lb (56.7 kg)   SpO2 98%   BMI 21.46 kg/m  CONSTITUTIONAL: No acute distress, well nourished. HEENT:  Normocephalic, atraumatic, extraocular motion intact. NECK:  trachea is midline, no jugular venous distention. RESPIRATORY:  Lungs are clear, and breath sounds are equal bilaterally. Normal respiratory effort without pathologic use of accessory muscles. CARDIOVASCULAR: Heart is regular without murmurs, gallops, or rubs. GI: The abdomen is soft, non-distended, non-tender to palpation.  The patient has a left sided PD catheter in place without any evidence of skin infection.  Prior laparoscopic incisions are well healed.  I am unable to discern any issues at the site where the catheter goes through the rectus muscle, and the catheter does not move when straining or movement.  He has a palpable 2 cm nodule in the midline about 5 cm superior to the catheter consistent with the hernia noted on CT scan with associated air/fluid collection.  It is soft, non-tender, without any erythema or induration.  Unable to palpate any other hernias.  MUSCULOSKELETAL:  No peripheral edema, normal gait on ambulation. NEUROLOGIC:  Motor and sensation is grossly normal.  Cranial nerves are grossly intact. PSYCH:  Alert and oriented to person, place and time. Affect is normal.  Labs/Imaging: CT scan abdomen/pelvis 12/05/20: IMPRESSION: Small right ventral abdominal wall hernias containing fluid and a few locules of gas.   Small volume abdominal ascites. Scattered small locules of free intraperitoneal air, likely related to peritoneal dialysis technique.   Bilateral hydronephrosis, overall decreased compared to prior exam. Thick-walled and trabeculated urinary bladder, findings are likely due to chronic outlet obstruction.   Aortic Atherosclerosis (ICD10-I70.0).  Assessment and Plan: This is a 65 y.o. male with possible PD catheter malfunction  --Discussed with the patient that I am unable  to palpate any hernia where the catheter inserts through the rectus.  Of the two hernia sites noted on his CT scan, I'm only able to palpate one which is asymptomatic, and the neck of the hernia is only 4 mm.  The patient's main complaint is his feeling that the catheter is "migrating".  Potentially, the cuff of the catheter may not be in good approximation to the rectus sheath and it could possibly be sliding as a result. On the CT scan the cuff does not appear to be in a wrong position.   --Based on his symptoms and findings, I think we could proceed with simple revision of the catheter insertion site through the rectus to evaluate for any loosening and reinforce if needed.  If there is any issue, could possibly need to replace the catheter, but I think that's less likely since the catheter is otherwise working well.  Also discussed with Dr. Juleen China and given that he's responded well to the antibiotic treatment for his peritonitis, he believes the catheter can stay and does not need to be replaced for now.   --Reviewed with the patient the surgery at length including risks of bleeding, infection, injury to surrounding structures, and he's willing to proceed.  We'll schedule him for 01/20/21.  Will also obtain cardiology clearance in case a more in depth surgery is needed.  He would also have to stop his Aspirin 5 days prior to surgery.   I spent 40 minutes dedicated to the care of this patient on the date of this encounter to include pre-visit review of records, face-to-face time with the patient discussing diagnosis and management, and any post-visit coordination of care.   Melvyn Neth, [facility]

## 2020-12-26 NOTE — H&P (View-Only) (Signed)
12/26/2020  History of Present Illness: Anthony Hess is a 65 y.o. male presenting for evaluation of possible ventral hernias and PD catheter issues.  He has a complex history with his PD catheter.  He has overall had 4 surgeries including replacement and also revisions due to malfunction or infection.  He had two surgeries with Dr. Dahlia Byes and two surgeries with Dr. Raul Del at [facility].  Most recently, he reports that he feels the catheter bulges at the insertion site through the rectus muscle and felt he had a hernia there.  He specifically mentions that he feels the catheter is "migrating".  He also had a recent bacterial peritonitis episode with fluid cultures showing Enterococcus and Klebsiella and he just completed treatment for it.  He had a CT scan on 12/05/20 which shows two small ventral hernias, but not at the site of concern.  He had a small 4 mm defect about 5 cm superior to the insertion site just to the right of midline, and an even smaller defect in the RUQ.  However, the patient denies any issues with those two areas and was not aware of any hernia in those locations.  Reports that the PD catheter currently is working well and has not had any issues with drainage/filtration.  Past Medical History: Past Medical History:  Diagnosis Date   Bladder tumor    Cancer [facility])    Bladder   Chronic kidney disease    renal insufficiency   Coronary artery disease    History of kidney stones    Hypertension    Myocardial infarction [facility])    Wears glasses      Past Surgical History: Past Surgical History:  Procedure Laterality Date   CAPD INSERTION N/A 12/31/2019   Procedure: LAPAROSCOPIC INSERTION CONTINUOUS AMBULATORY PERITONEAL DIALYSIS  (CAPD) CATHETER;  Surgeon: Jules Husbands, MD;  Location: [facility] ORS;  Service: General;  Laterality: N/A;   CAPD REMOVAL N/A 04/10/2020   Procedure: LAPAROSCOPIC REVISION OF CONTINUOUS AMBULATORY PERITONEAL DIALYSIS  (CAPD) CATHETER;  Surgeon:  Jules Husbands, MD;  Location: [facility];  Service: General;  Laterality: N/A;   CORONARY ARTERY BYPASS GRAFT N/A 12/27/2018   Procedure: CORONARY ARTERY BYPASS GRAFTING (CABG) X 4 ON PUMP USING RIGHT & LEFT INTERNAL MAMMARY ARTERY LEFT RADIAL ARTERY ENDOSCOPICALLY HARVESTED;  Surgeon: [name], MD;  Location: [place];  Service: Open Heart Surgery;  Laterality: N/A;   CYSTOSCOPY W/ RETROGRADES Bilateral 05/15/2019   Procedure: CYSTOSCOPY WITH RETROGRADE PYELOGRAM;  Surgeon: Abbie Sons, MD;  Location: [facility] OR[facility];  Service: Urology;  Laterality: Bilateral;   CYSTOSCOPY WITH BIOPSY N/A 05/15/2019   Procedure: CYSTOSCOPY WITH bladder BIOPSY;  Surgeon: Abbie Sons, MD;  Location: [facility];  Service: Urology;  Laterality: N/A;   DIALYSIS/PERMA CATHETER INSERTION N/A 12/28/2019   Procedure: DIALYSIS/PERMA CATHETER INSERTION;  Surgeon: Algernon Huxley, MD;  Location: [place] [facility];  Service: Cardiovascular;  Laterality: N/A;   DIALYSIS/PERMA CATHETER REMOVAL N/A 06/02/2020   Procedure: DIALYSIS/PERMA CATHETER REMOVAL;  Surgeon: Algernon Huxley, MD;  Location: [place] [facility];  Service: Cardiovascular;  Laterality: N/A;   EXCHANGE OF A DIALYSIS CATHETER Right 04/10/2020   Procedure: EXCHANGE OF A DIALYSIS CATHETER;  Surgeon: Jules Husbands, MD;  Location: [facility];  Service: General;  Laterality: Right;   IR IMAGE GUIDED DRAINAGE PERCUT CATH  PERITONEAL RETROPERIT  04/07/2020   LEFT HEART CATH AND CORONARY ANGIOGRAPHY Left 12/20/2018   Procedure: LEFT HEART CATH AND CORONARY  ANGIOGRAPHY;  Surgeon: Isaias Cowman, MD;  Location: [place] [facility];  Service: Cardiovascular;  Laterality: Left;   RADIAL ARTERY HARVEST Left 12/27/2018   Procedure: ENDOSCOPIC RADIAL ARTERY HARVEST;  Surgeon: [name], MD;  Location: [place];  Service: Open Heart Surgery;  Laterality: Left;   TEE WITHOUT CARDIOVERSION N/A 12/27/2018   Procedure: TRANSESOPHAGEAL ECHOCARDIOGRAM (TEE);  Surgeon: [name], MD;  Location: [place];  Service: Open Heart Surgery;  Laterality: N/A;   TUMOR REMOVAL  2019   Bladder    Home Medications: Prior to Admission medications   Medication Sig Start Date End Date Taking? Authorizing Provider  aspirin EC 81 MG tablet Take 81 mg by mouth daily.   Yes [name], MD  atorvastatin (LIPITOR) 40 MG tablet Take 1 tablet (40 mg total) by mouth daily. 01/01/20  Yes Fritzi Mandes, MD  calcitRIOL (ROCALTROL) 0.5 MCG capsule Take 1 capsule (0.5 mcg total) by mouth daily. 01/01/20  Yes Fritzi Mandes, MD  calcium acetate (PHOSLO) 667 MG capsule Take 2 capsules (1,334 mg total) by mouth 3 (three) times daily with meals. 01/01/20  Yes Fritzi Mandes, MD  calcium carbonate (OS-CAL - DOSED IN MG OF ELEMENTAL CALCIUM) 1250 (500 Ca) MG tablet Take 1 tablet (1,250 mg total) by mouth 2 (two) times daily between meals. 01/01/20  Yes Fritzi Mandes, MD  metoprolol succinate (TOPROL-XL) 50 MG 24 hr tablet Take 1 tablet (50 mg total) by mouth daily. Take with or immediately following a meal. 01/01/20  Yes Fritzi Mandes, MD  Vitamin D, Ergocalciferol, (DRISDOL) 1.25 MG (50000 UNIT) CAPS capsule Take 1 capsule (50,000 Units total) by mouth every 7 (seven) days. Patient taking differently: Take 50,000 Units by mouth every Saturday. 01/05/20  Yes Fritzi Mandes, MD    Allergies: No Known Allergies  Review of Systems: Review of Systems  Constitutional:  Negative for chills and fever.  HENT:  Negative for hearing loss.   Respiratory:  Negative for shortness of breath.   Cardiovascular:  Negative for chest pain.  Gastrointestinal:  Negative for abdominal pain, constipation, diarrhea, nausea and vomiting.  Genitourinary:  Negative for dysuria.  Musculoskeletal:  Negative for myalgias.  Skin:  Negative for rash.  Neurological:  Negative for dizziness.  Psychiatric/Behavioral:  Negative for depression.    Physical Exam BP (!) 143/104   Pulse 77   Temp 97.8 F (36.6 C) (Oral)   Ht 5\' 4"   (1.626 m)   Wt 125 lb (56.7 kg)   SpO2 98%   BMI 21.46 kg/m  CONSTITUTIONAL: No acute distress, well nourished. HEENT:  Normocephalic, atraumatic, extraocular motion intact. NECK:  trachea is midline, no jugular venous distention. RESPIRATORY:  Lungs are clear, and breath sounds are equal bilaterally. Normal respiratory effort without pathologic use of accessory muscles. CARDIOVASCULAR: Heart is regular without murmurs, gallops, or rubs. GI: The abdomen is soft, non-distended, non-tender to palpation.  The patient has a left sided PD catheter in place without any evidence of skin infection.  Prior laparoscopic incisions are well healed.  I am unable to discern any issues at the site where the catheter goes through the rectus muscle, and the catheter does not move when straining or movement.  He has a palpable 2 cm nodule in the midline about 5 cm superior to the catheter consistent with the hernia noted on CT scan with associated air/fluid collection.  It is soft, non-tender, without any erythema or induration.  Unable to palpate any other hernias.  MUSCULOSKELETAL:  No peripheral edema, normal gait on ambulation. NEUROLOGIC:  Motor and sensation is grossly normal.  Cranial nerves are grossly intact. PSYCH:  Alert and oriented to person, place and time. Affect is normal.  Labs/Imaging: CT scan abdomen/pelvis 12/05/20: IMPRESSION: Small right ventral abdominal wall hernias containing fluid and a few locules of gas.   Small volume abdominal ascites. Scattered small locules of free intraperitoneal air, likely related to peritoneal dialysis technique.   Bilateral hydronephrosis, overall decreased compared to prior exam. Thick-walled and trabeculated urinary bladder, findings are likely due to chronic outlet obstruction.   Aortic Atherosclerosis (ICD10-I70.0).  Assessment and Plan: This is a 65 y.o. male with possible PD catheter malfunction  --Discussed with the patient that I am unable  to palpate any hernia where the catheter inserts through the rectus.  Of the two hernia sites noted on his CT scan, I'm only able to palpate one which is asymptomatic, and the neck of the hernia is only 4 mm.  The patient's main complaint is his feeling that the catheter is "migrating".  Potentially, the cuff of the catheter may not be in good approximation to the rectus sheath and it could possibly be sliding as a result. On the CT scan the cuff does not appear to be in a wrong position.   --Based on his symptoms and findings, I think we could proceed with simple revision of the catheter insertion site through the rectus to evaluate for any loosening and reinforce if needed.  If there is any issue, could possibly need to replace the catheter, but I think that's less likely since the catheter is otherwise working well.  Also discussed with Dr. Juleen China and given that he's responded well to the antibiotic treatment for his peritonitis, he believes the catheter can stay and does not need to be replaced for now.   --Reviewed with the patient the surgery at length including risks of bleeding, infection, injury to surrounding structures, and he's willing to proceed.  We'll schedule him for 01/20/21.  Will also obtain cardiology clearance in case a more in depth surgery is needed.  He would also have to stop his Aspirin 5 days prior to surgery.   I spent 40 minutes dedicated to the care of this patient on the date of this encounter to include pre-visit review of records, face-to-face time with the patient discussing diagnosis and management, and any post-visit coordination of care.   Melvyn Neth, [facility]

## 2020-12-29 ENCOUNTER — Telehealth: Payer: Self-pay | Admitting: Surgery

## 2020-12-29 NOTE — Telephone Encounter (Signed)
Received call back from patient.  He is now aware of all dates regarding his surgery and verbalized understanding.  Patient also reminded to stop his aspirin 5 days prior to surgery.

## 2020-12-29 NOTE — Telephone Encounter (Signed)
Outgoing call is made, unable to leave a message as patient's voice mail box not set up.  Please inform patient of the following:  Patient has been advised of Pre-Admission date/time, COVID Testing date and Surgery date.  Surgery Date: 01/20/21 Preadmission Testing Date: 01/13/21 (phone 8a-1p) Covid Testing Date: Not needed.     Also patient will need to call at [phone], between 1-3:00pm the day before surgery, to find out what time to arrive for surgery.

## 2020-12-29 NOTE — Telephone Encounter (Signed)
ALSO, patient will need to stop his aspirin 5 days prior to surgery.

## 2020-12-30 ENCOUNTER — Telehealth: Payer: Self-pay

## 2020-12-30 NOTE — Telephone Encounter (Signed)
Received Cardiac Clearance from Dr. Cammie Sickle. Pt's procedure risk assessment is low. This patient is optimized for surgery. Pt should stop Aspirin 4 days prior to procedure.

## 2021-01-13 ENCOUNTER — Encounter
Admission: RE | Admit: 2021-01-13 | Discharge: 2021-01-13 | Disposition: A | Discharge status: Home / Self Care | Payer: Medicare Other | Source: Ambulatory Visit | Attending: Surgery | Admitting: Surgery

## 2021-01-13 ENCOUNTER — Other Ambulatory Visit: Payer: Self-pay

## 2021-01-13 VITALS — Ht 64.0 in | Wt 130.0 lb

## 2021-01-13 DIAGNOSIS — I129 Hypertensive chronic kidney disease with stage 1 through stage 4 chronic kidney disease, or unspecified chronic kidney disease: Secondary | ICD-10-CM

## 2021-01-13 NOTE — Patient Instructions (Addendum)
Your procedure is scheduled on: Tuesday 01/20/21 Report to the Registration Desk on the 1st floor of the [facility]. To find out your arrival time, please call [phone] between 1PM - 3PM on: Monday 01/19/21  REMEMBER: Instructions that are not followed completely may result in serious medical risk, up to and including death; or upon the discretion of your surgeon and anesthesiologist your surgery may need to be rescheduled.  Do not eat food after midnight the night before surgery.  No gum chewing, lozengers or hard candies.  You may however, drink CLEAR liquids up to 2 hours before you are scheduled to arrive for your surgery. Do not drink anything within 2 hours of your scheduled arrival time.  Clear liquids include: - water  - apple juice without pulp - gatorade (not RED, PURPLE, OR BLUE) - black coffee or tea (Do NOT add milk or creamers to the coffee or tea) Do NOT drink anything that is not on this list.  TAKE THESE MEDICATIONS THE MORNING OF SURGERY WITH A SIP OF WATER: atorvastatin (LIPITOR) 40 MG tablet  One week prior to surgery: Stop Anti-inflammatories (NSAIDS) such as Advil, Aleve, Ibuprofen, Motrin, Naproxen, Naprosyn and Aspirin based products such as Excedrin, Goodys Powder, BC Powder. Stop ANY OVER THE COUNTER supplements until after surgery.  You may however, continue to take Tylenol if needed for pain up until the day of surgery.  Stop taking your Aspirin 5 days prior to procedure per Dr. Hampton Abbot.  No Alcohol for 24 hours before or after surgery.  No Smoking including e-cigarettes for 24 hours prior to surgery.  No chewable tobacco products for at least 6 hours prior to surgery.  No nicotine patches on the day of surgery.  Do not use any "recreational" drugs for at least a week prior to your surgery.  Please be advised that the combination of cocaine and anesthesia may have negative outcomes, up to and including death. If you test positive for cocaine,  your surgery will be cancelled.  On the morning of surgery brush your teeth with toothpaste and water, you may rinse your mouth with mouthwash if you wish. Do not swallow any toothpaste or mouthwash.  Use CHG wipes as directed on instruction sheet.  Do not wear jewelry.  Do not wear lotions, powders, or cologne.   Do not shave body from the neck down 48 hours prior to surgery just in case you cut yourself which could leave a site for infection.  Also, freshly shaved skin may become irritated if using the CHG soap.  Do not bring valuables to the hospital. [facility] is not responsible for any missing/lost belongings or valuables.   Notify your doctor if there is any change in your medical condition (cold, fever, infection).  Wear comfortable clothing (specific to your surgery type) to the hospital.  After surgery, you can help prevent lung complications by doing breathing exercises.  Take deep breaths and cough every 1-2 hours. Your doctor may order a device called an Incentive Spirometer to help you take deep breaths. When coughing or sneezing, hold a pillow firmly against your incision with both hands. This is called "splinting." Doing this helps protect your incision. It also decreases belly discomfort.  If you are being discharged the day of surgery, you will not be allowed to drive home. You will need a responsible adult (18 years or older) to drive you home and stay with you that night.   If you are taking public transportation, you  will need to have a responsible adult (18 years or older) with you. Please confirm with your physician that it is acceptable to use public transportation.   Please call the [facility]. at [phone] if you have any questions about these instructions.  Surgery Visitation Policy:  Patients undergoing a surgery or procedure may have one family member or support person with them as long as that person is not COVID-19 positive or  experiencing its symptoms.  That person may remain in the waiting area during the procedure and may rotate out with other people.  Inpatient Visitation:    Visiting hours are 7 a.m. to 8 p.m. Up to two visitors ages 16+ are allowed at one time in a patient room. The visitors may rotate out with other people during the day. Visitors must check out when they leave, or other visitors will not be allowed. One designated support person may remain overnight. The visitor must pass COVID-19 screenings, use hand sanitizer when entering and exiting the patient's room and wear a mask at all times, including in the patient's room. Patients must also wear a mask when staff or their visitor are in the room. Masking is required regardless of vaccination status.

## 2021-01-14 ENCOUNTER — Other Ambulatory Visit: Admission: RE | Admit: 2021-01-14 | Payer: Medicare Other | Source: Ambulatory Visit

## 2021-01-19 ENCOUNTER — Other Ambulatory Visit
Admission: RE | Admit: 2021-01-19 | Discharge: 2021-01-19 | Disposition: A | Discharge status: Home / Self Care | Payer: Medicare Other | Source: Ambulatory Visit | Attending: Surgery | Admitting: Surgery

## 2021-01-19 ENCOUNTER — Other Ambulatory Visit: Payer: Self-pay

## 2021-01-19 DIAGNOSIS — N189 Chronic kidney disease, unspecified: Secondary | ICD-10-CM | POA: Insufficient documentation

## 2021-01-19 DIAGNOSIS — I129 Hypertensive chronic kidney disease with stage 1 through stage 4 chronic kidney disease, or unspecified chronic kidney disease: Secondary | ICD-10-CM | POA: Insufficient documentation

## 2021-01-19 DIAGNOSIS — Z01810 Encounter for preprocedural cardiovascular examination: Secondary | ICD-10-CM

## 2021-01-19 DIAGNOSIS — Z01818 Encounter for other preprocedural examination: Secondary | ICD-10-CM | POA: Diagnosis present

## 2021-01-19 LAB — CBC
HCT: 34.3 % — ABNORMAL LOW (ref 39.0–52.0)
Hemoglobin: 10.4 g/dL — ABNORMAL LOW (ref 13.0–17.0)
MCH: 29 pg (ref 26.0–34.0)
MCHC: 30.3 g/dL (ref 30.0–36.0)
MCV: 95.5 fL (ref 80.0–100.0)
Platelets: 329 10*3/uL (ref 150–400)
RBC: 3.59 MIL/uL — ABNORMAL LOW (ref 4.22–5.81)
RDW: 14.6 % (ref 11.5–15.5)
WBC: 7.7 10*3/uL (ref 4.0–10.5)
nRBC: 0 % (ref 0.0–0.2)

## 2021-01-20 ENCOUNTER — Other Ambulatory Visit: Payer: Self-pay

## 2021-01-20 ENCOUNTER — Ambulatory Visit: Payer: Medicare Other | Admitting: Urgent Care

## 2021-01-20 ENCOUNTER — Ambulatory Visit
Admission: RE | Admit: 2021-01-20 | Discharge: 2021-01-20 | Disposition: A | Discharge status: Home / Self Care | Payer: Medicare Other | Attending: Surgery | Admitting: Surgery

## 2021-01-20 ENCOUNTER — Ambulatory Visit: Payer: Medicare Other | Admitting: Anesthesiology

## 2021-01-20 ENCOUNTER — Encounter: Payer: Self-pay | Admitting: Surgery

## 2021-01-20 ENCOUNTER — Encounter
Admission: RE | Disposition: A | Discharge status: Home / Self Care | Payer: Self-pay | Source: Home / Self Care | Attending: Surgery

## 2021-01-20 DIAGNOSIS — Z951 Presence of aortocoronary bypass graft: Secondary | ICD-10-CM | POA: Insufficient documentation

## 2021-01-20 DIAGNOSIS — N2581 Secondary hyperparathyroidism of renal origin: Secondary | ICD-10-CM | POA: Diagnosis not present

## 2021-01-20 DIAGNOSIS — I120 Hypertensive chronic kidney disease with stage 5 chronic kidney disease or end stage renal disease: Secondary | ICD-10-CM | POA: Diagnosis not present

## 2021-01-20 DIAGNOSIS — D631 Anemia in chronic kidney disease: Secondary | ICD-10-CM | POA: Diagnosis not present

## 2021-01-20 DIAGNOSIS — N186 End stage renal disease: Secondary | ICD-10-CM | POA: Diagnosis not present

## 2021-01-20 DIAGNOSIS — T85611D Breakdown (mechanical) of intraperitoneal dialysis catheter, subsequent encounter: Secondary | ICD-10-CM

## 2021-01-20 DIAGNOSIS — T85611A Breakdown (mechanical) of intraperitoneal dialysis catheter, initial encounter: Secondary | ICD-10-CM

## 2021-01-20 DIAGNOSIS — Z8551 Personal history of malignant neoplasm of bladder: Secondary | ICD-10-CM | POA: Diagnosis not present

## 2021-01-20 DIAGNOSIS — Z87442 Personal history of urinary calculi: Secondary | ICD-10-CM | POA: Insufficient documentation

## 2021-01-20 DIAGNOSIS — Z992 Dependence on renal dialysis: Secondary | ICD-10-CM | POA: Diagnosis not present

## 2021-01-20 DIAGNOSIS — K432 Incisional hernia without obstruction or gangrene: Secondary | ICD-10-CM | POA: Insufficient documentation

## 2021-01-20 HISTORY — PX: INCISIONAL HERNIA REPAIR: SHX193

## 2021-01-20 LAB — POCT I-STAT, CHEM 8
BUN: 65 mg/dL — ABNORMAL HIGH (ref 8–23)
Calcium, Ion: 0.74 mmol/L — CL (ref 1.15–1.40)
Chloride: 107 mmol/L (ref 98–111)
Creatinine, Ser: 7.7 mg/dL — ABNORMAL HIGH (ref 0.61–1.24)
Glucose, Bld: 87 mg/dL (ref 70–99)
HCT: 31 % — ABNORMAL LOW (ref 39.0–52.0)
Hemoglobin: 10.5 g/dL — ABNORMAL LOW (ref 13.0–17.0)
Potassium: 3.7 mmol/L (ref 3.5–5.1)
Sodium: 142 mmol/L (ref 135–145)
TCO2: 21 mmol/L — ABNORMAL LOW (ref 22–32)

## 2021-01-20 SURGERY — REPAIR, HERNIA, INCISIONAL
Anesthesia: General

## 2021-01-20 MED ORDER — EPHEDRINE SULFATE 50 MG/ML IJ SOLN
INTRAMUSCULAR | Status: DC | PRN
  Administered 2021-01-20 (×3): 10 mg via INTRAVENOUS

## 2021-01-20 MED ORDER — OXYCODONE HCL 5 MG PO TABS: 5.0000 mg | ORAL_TABLET | ORAL | 0 refills | Status: DC | PRN

## 2021-01-20 MED ORDER — ONDANSETRON HCL 4 MG/2ML IJ SOLN
INTRAMUSCULAR | Status: DC | PRN
  Administered 2021-01-20: 4 mg via INTRAVENOUS

## 2021-01-20 MED ORDER — DEXAMETHASONE SODIUM PHOSPHATE 10 MG/ML IJ SOLN
INTRAMUSCULAR | Status: DC | PRN
  Administered 2021-01-20: 5 mg via INTRAVENOUS

## 2021-01-20 MED ORDER — LACTATED RINGERS IV SOLN: INTRAVENOUS | Status: DC

## 2021-01-20 MED ORDER — CHLORHEXIDINE GLUCONATE 0.12 % MT SOLN
15.0000 mL | Freq: Once | OROMUCOSAL | Status: AC
  Administered 2021-01-20: 15 mL via OROMUCOSAL

## 2021-01-20 MED ORDER — FENTANYL CITRATE (PF) 100 MCG/2ML IJ SOLN
INTRAMUSCULAR | Status: DC | PRN
  Administered 2021-01-20: 25 ug via INTRAVENOUS
  Administered 2021-01-20: 75 ug via INTRAVENOUS

## 2021-01-20 MED ORDER — CHLORHEXIDINE GLUCONATE CLOTH 2 % EX PADS: 6.0000 | MEDICATED_PAD | Freq: Once | CUTANEOUS | Status: DC

## 2021-01-20 MED ORDER — LIDOCAINE HCL (CARDIAC) PF 100 MG/5ML IV SOSY
PREFILLED_SYRINGE | INTRAVENOUS | Status: DC | PRN
  Administered 2021-01-20: 60 mg via INTRAVENOUS

## 2021-01-20 MED ORDER — CEFAZOLIN SODIUM-DEXTROSE 2-4 GM/100ML-% IV SOLN
2.0000 g | INTRAVENOUS | Status: AC
  Administered 2021-01-20: 2 g via INTRAVENOUS

## 2021-01-20 MED ORDER — SUCCINYLCHOLINE CHLORIDE 200 MG/10ML IV SOSY
PREFILLED_SYRINGE | INTRAVENOUS | Status: DC | PRN
  Administered 2021-01-20: 80 mg via INTRAVENOUS

## 2021-01-20 MED ORDER — FAMOTIDINE 20 MG PO TABS
20.0000 mg | ORAL_TABLET | Freq: Once | ORAL | Status: AC
  Administered 2021-01-20: 20 mg via ORAL

## 2021-01-20 MED ORDER — SUGAMMADEX SODIUM 200 MG/2ML IV SOLN
INTRAVENOUS | Status: DC | PRN
  Administered 2021-01-20: 125 mg via INTRAVENOUS

## 2021-01-20 MED ORDER — ORAL CARE MOUTH RINSE: 15.0000 mL | Freq: Once | OROMUCOSAL | Status: AC

## 2021-01-20 MED ORDER — GABAPENTIN 100 MG PO CAPS
200.0000 mg | ORAL_CAPSULE | ORAL | Status: AC
  Administered 2021-01-20: 200 mg via ORAL

## 2021-01-20 MED ORDER — ACETAMINOPHEN 500 MG PO TABS
1000.0000 mg | ORAL_TABLET | ORAL | Status: AC
  Administered 2021-01-20: 1000 mg via ORAL

## 2021-01-20 MED ORDER — ETOMIDATE 2 MG/ML IV SOLN
INTRAVENOUS | Status: DC | PRN
  Administered 2021-01-20: 14 mg via INTRAVENOUS

## 2021-01-20 MED ORDER — SODIUM CHLORIDE 0.9 % IV SOLN: INTRAVENOUS | Status: DC

## 2021-01-20 MED ORDER — ROCURONIUM BROMIDE 100 MG/10ML IV SOLN
INTRAVENOUS | Status: DC | PRN
  Administered 2021-01-20: 30 mg via INTRAVENOUS

## 2021-01-20 MED ORDER — BUPIVACAINE-EPINEPHRINE 0.5% -1:200000 IJ SOLN
INTRAMUSCULAR | Status: DC | PRN
  Administered 2021-01-20: 30 mL

## 2021-01-20 MED ORDER — ACETAMINOPHEN 500 MG PO TABS: 1000.0000 mg | ORAL_TABLET | Freq: Four times a day (QID) | ORAL | Status: DC | PRN

## 2021-01-20 MED ORDER — PHENYLEPHRINE HCL (PRESSORS) 10 MG/ML IV SOLN
INTRAVENOUS | Status: DC | PRN
  Administered 2021-01-20 (×4): 100 ug via INTRAVENOUS

## 2021-01-20 SURGICAL SUPPLY — 47 items
ADAPTER CATH DIALYSIS 4X8 IT L (MISCELLANEOUS) IMPLANT
ADH SKN CLS APL DERMABOND .7 (GAUZE/BANDAGES/DRESSINGS) ×2
APL PRP STRL LF DISP 70% ISPRP (MISCELLANEOUS)
BLADE SURG 11 STRL SS SAFETY (MISCELLANEOUS) ×3 IMPLANT
CATH EXTENDED DIALYSIS (CATHETERS) IMPLANT
CHLORAPREP W/TINT 26 (MISCELLANEOUS) IMPLANT
DERMABOND ADVANCED (GAUZE/BANDAGES/DRESSINGS) ×1
DERMABOND ADVANCED .7 DNX12 (GAUZE/BANDAGES/DRESSINGS) ×2 IMPLANT
ELECT CAUTERY BLADE 6.4 (BLADE) ×3 IMPLANT
ELECT REM PT RETURN 9FT ADLT (ELECTROSURGICAL) ×3 IMPLANT
ELECTRODE REM PT RTRN 9FT ADLT (ELECTROSURGICAL) ×2 IMPLANT
GAUZE 4X4 16PLY ~~LOC~~+RFID DBL (SPONGE) ×3 IMPLANT
GLOVE SURG SYN 7.0 (GLOVE) ×6 IMPLANT
GLOVE SURG SYN 7.5  E (GLOVE) ×2
GLOVE SURG SYN 7.5 E (GLOVE) ×4 IMPLANT
GOWN STRL REUS W/ TWL LRG LVL3 (GOWN DISPOSABLE) ×4 IMPLANT
GOWN STRL REUS W/TWL LRG LVL3 (GOWN DISPOSABLE) ×6
IV NS 1000ML (IV SOLUTION)
IV NS 1000ML BAXH (IV SOLUTION) IMPLANT
KIT TURNOVER KIT A (KITS) ×3 IMPLANT
LABEL OR SOLS (LABEL) ×3 IMPLANT
MANIFOLD NEPTUNE II (INSTRUMENTS) ×3 IMPLANT
MINICAP W/POVIDONE IODINE SOL (MISCELLANEOUS) IMPLANT
NEEDLE HYPO 22GX1.5 SAFETY (NEEDLE) ×3 IMPLANT
NEEDLE INSUFFLATION 14GA 120MM (NEEDLE) IMPLANT
NS IRRIG 500ML POUR BTL (IV SOLUTION) ×3 IMPLANT
PACK LAP CHOLECYSTECTOMY (MISCELLANEOUS) ×3 IMPLANT
PENCIL ELECTRO HAND CTR (MISCELLANEOUS) ×3 IMPLANT
SET CYSTO W/LG BORE CLAMP LF (SET/KITS/TRAYS/PACK) IMPLANT
SET TRANSFER 6 W/TWIST CLAMP 5 (SET/KITS/TRAYS/PACK) IMPLANT
SET TUBE SMOKE EVAC HIGH FLOW (TUBING) IMPLANT
SLEEVE ADV FIXATION 5X100MM (TROCAR) IMPLANT
SPONGE DRAIN TRACH 4X4 STRL 2S (GAUZE/BANDAGES/DRESSINGS) ×3 IMPLANT
STYLET FALLER (MISCELLANEOUS) IMPLANT
STYLET FALLER MEDIONICS (MISCELLANEOUS) IMPLANT
SUT ETHILON 2 0 FS 18 (SUTURE) IMPLANT
SUT MNCRL 4-0 (SUTURE) ×3
SUT MNCRL 4-0 27 PS-2 XMFL (SUTURE) ×2 IMPLANT
SUT MNCRL 4-0 27XMFL (SUTURE) ×2
SUT PDS AB 1 CT1 36 (SUTURE) ×6 IMPLANT
SUT VIC AB 3-0 SH 27 (SUTURE) ×3
SUT VIC AB 3-0 SH 27X BRD (SUTURE) ×2 IMPLANT
SUT VICRYL 0 AB UR-6 (SUTURE) ×3 IMPLANT
SUTURE MNCRL 4-0 27XMF (SUTURE) ×2 IMPLANT
SYS KII FIOS ACCESS ABD 5X100 (TROCAR) IMPLANT
SYSTEM KII FIOS ACES ABD 5X100 (TROCAR) IMPLANT
WATER STERILE IRR 500ML POUR (IV SOLUTION) IMPLANT

## 2021-01-20 NOTE — Interval H&P Note (Signed)
History and Physical Interval Note:  01/20/2021 12:59 PM  Anthony Hess  has presented today for surgery, with the diagnosis of PD catheter malfunction.  The various methods of treatment have been discussed with the patient and family. After consideration of risks, benefits and other options for treatment, the patient has consented to  Procedure(s): [place]  (CAPD) CATHETER (N/A) as a surgical intervention.  The patient's history has been reviewed, patient examined, no change in status, stable for surgery.  I have reviewed the patient's chart and labs.  Questions were answered to the patient's satisfaction.     Kampbell Holaway

## 2021-01-20 NOTE — Progress Notes (Signed)
Notified Dr. Wynetta Emery about elevated BP and made aware that patient reports not taking his Metoprolol in the last week. No new orders were received at this time.

## 2021-01-20 NOTE — Discharge Instructions (Signed)

## 2021-01-20 NOTE — Anesthesia Procedure Notes (Signed)
Procedure Name: Intubation Date/Time: 01/20/2021 1:30 PM Performed by: Chanetta Marshall, CRNA Pre-anesthesia Checklist: Patient identified, Emergency Drugs available, Suction available and Patient being monitored Patient Re-evaluated:Patient Re-evaluated prior to induction Oxygen Delivery Method: Circle system utilized Preoxygenation: Pre-oxygenation with 100% oxygen Induction Type: IV induction Ventilation: Mask ventilation without difficulty Laryngoscope Size: McGraph and 4 Grade View: Grade I Tube type: Oral Tube size: 7.5 mm Number of attempts: 1 Airway Equipment and Method: Oral airway and Video-laryngoscopy Placement Confirmation: ETT inserted through vocal cords under direct vision, positive ETCO2, breath sounds checked- equal and bilateral and CO2 detector Secured at: 21 cm Tube secured with: Tape Dental Injury: Teeth and Oropharynx as per pre-operative assessment

## 2021-01-20 NOTE — Anesthesia Preprocedure Evaluation (Addendum)
Anesthesia Evaluation  Patient identified by MRN, date of birth, ID band Patient awake    Reviewed: Allergy & Precautions, H&P , NPO status , Patient's Chart, lab work & pertinent test results, reviewed documented beta blocker date and time   History of Anesthesia Complications Negative for: history of anesthetic complications  Airway Mallampati: II  TM Distance: >3 FB Neck ROM: limited    Dental  (+) Chipped, Poor Dentition, Missing, Edentulous Upper,    Pulmonary COPD, neg recent URI, former smoker,    - rhonchi + decreased breath sounds      Cardiovascular Exercise Tolerance: Good hypertension, Pt. on medications and Pt. on home beta blockers (-) angina+ CAD, + Past MI, + CABG (x4 2020), + Peripheral Vascular Disease and +CHF (HFrEF)  (-) Cardiac Stents Normal cardiovascular exam(-) dysrhythmias + Valvular Problems/Murmurs MR and AI  Rhythm:Regular Rate:Normal - Systolic murmurs TTE 45/4/09: 1. Left ventricular ejection fraction, by estimation, is 20 to 25%. The  left ventricle has severely decreased function. The left ventricle has no  regional wall motion abnormalities. The left ventricular internal cavity  size was moderately dilated. Left  ventricular diastolic parameters are consistent with Grade I diastolic  dysfunction (impaired relaxation).  2. Right ventricular systolic function is normal. The right ventricular  size is normal.  3. The mitral valve is normal in structure. Moderate mitral valve  regurgitation. No evidence of mitral stenosis.  4. The aortic valve is normal in structure. Aortic valve regurgitation is  mild to moderate. No aortic stenosis is present.  5. The inferior vena cava is normal in size with greater than 50%  respiratory variability, suggesting right atrial pressure of 3 mmHg.    Neuro/Psych negative neurological ROS  negative psych ROS   GI/Hepatic negative GI ROS, Neg liver ROS, neg  GERD  ,  Endo/Other  Secondary hyperparathyroidism   Renal/GU CRF and DialysisRenal diseasePD catheter malfunction     Musculoskeletal   Abdominal (+) - obese,   Peds  Hematology  (+) Blood dyscrasia (Anemia of Chronic disease), anemia ,   Anesthesia Other Findings Past Medical History: No date: Bladder tumor No date: Cancer ([place])     Comment:  Bladder No date: Chronic kidney disease     Comment:  renal insufficiency No date: Coronary artery disease No date: History of kidney stones No date: Hypertension No date: Myocardial infarction ([place]) No date: Wears glasses    Reproductive/Obstetrics                           Anesthesia Physical  Anesthesia Plan  ASA: 3  Anesthesia Plan: General   Post-op Pain Management:    Induction: Intravenous  PONV Risk Score and Plan: 2 and Ondansetron, Dexamethasone, Treatment may vary due to age or medical condition and Midazolam  Airway Management Planned: Oral ETT  Additional Equipment:   Intra-op Plan:   Post-operative Plan: Extubation in OR  Informed Consent: I have reviewed the patients History and Physical, chart, labs and discussed the procedure including the risks, benefits and alternatives for the proposed anesthesia with the patient or authorized representative who has indicated his/her understanding and acceptance.     Dental advisory given  Plan Discussed with: CRNA and Anesthesiologist  Anesthesia Plan Comments: (Patient informed that they are higher risk for complications from anesthesia during this procedure due to their medical history.  Patient voiced understanding.  Patient consented for risks of anesthesia including but not limited to:  -  adverse reactions to medications - damage to eyes, teeth, lips or other oral mucosa - nerve damage due to positioning  - sore throat or hoarseness - Damage to heart, brain, nerves, lungs, other parts of body or loss of life  Patient voiced  understanding.)      Anesthesia Quick Evaluation

## 2021-01-20 NOTE — Op Note (Signed)
  Procedure Date:  01/20/2021  Pre-operative Diagnosis:  Peritoneal dialysis catheter malfunction  Post-operative Diagnosis: Incisional hernia  Procedure:  Open incisional hernia repair  Surgeon:  Melvyn Neth, MD  Anesthesia:  General endotracheal  Estimated Blood Loss:  5 ml  Specimens:  None  Complications:  None  Findings:  Upon exploration of the PD catheter insertion site through the left rectus muscle, it was noted that the anterior rectus sheath had developed an incisional hernia, with a 1 cm hernia defect surrounding the catheter proximal to the cuff.  The cuff itself was well secured and did not need revision.  Indications for Procedure:  This is a 65 y.o. male with a history of end stage renal disease, currently on peritoneal dialysis at home.  He presented to our office with complaints of feeling the catheter migrating at the insertion site.  Discussed with him that he could have a possible hernia or that the cuff itself may be loose, and we would explore the insertion site in the OR.  Discussed with him the risks of bleeding, infection, injury to the catheter, injury to surrounding structures, and he's willing to proceed.  Description of Procedure: The patient was correctly identified in the preoperative area and brought into the operating room.  The patient was placed supine with VTE prophylaxis in place.  Appropriate time-outs were performed.  Anesthesia was induced and the patient was intubated.  Appropriate antibiotics were infused.  The abdomen including the peritoneal dialysis catheter was prepped and draped in a sterile fashion.  An incision was made at the patient's infraumbilical site, and cautery was used to dissect down the subcutaneous tissue down to the catheter itself.  The catheter was preserved intact without injury.  It was tracked down to the rectus sheath and it was noted that the anterior rectus sheath had separated from the catheter, exposing a hernia  defect about 3 cm in size.  The posterior sheath was also separated.  The catheter cuff was well adhered medially.  The anterior rectus sheath was then dissected and the defect in the anterior rectus sheath was closed using a #1 PDS suture x 2.  There was no injury to the catheter.  The wound was irrigated.  Local anesthetic was then infused onto the rectus sheath and subcutaneous tissue.  The wound was then closed in three layers using 2-0 Vicryl, 3-0 Vicryl, and 4-0 Monocryl.  The wound was then cleaned and sealed the DermaBond.  The PD catheter was dressed with 4x4 gauze and tape.  The patient was emerged from anesthesia and extubated and brought to the recovery room for further management.  The patient tolerated the procedure well and all counts were correct at the end of the case.   Melvyn Neth, MD

## 2021-01-20 NOTE — Transfer of Care (Signed)
Immediate Anesthesia Transfer of Care Note  Patient: Anthony Hess  Procedure(s) Performed: HERNIA REPAIR INCISIONAL  Patient Location: PACU  Anesthesia Type:General  Level of Consciousness: awake, alert  and oriented  Airway & Oxygen Therapy: Patient Spontanous Breathing and Patient connected to face mask oxygen  Post-op Assessment: Report given to RN and Post -op Vital signs reviewed and stable  Post vital signs: Reviewed and stable  Last Vitals:  Vitals Value Taken Time  BP    Temp    Pulse    Resp    SpO2      Last Pain:  Vitals:   01/20/21 1219  TempSrc: Temporal  PainSc: 0-No pain         Complications: No notable events documented.

## 2021-01-21 ENCOUNTER — Encounter: Payer: Self-pay | Admitting: Surgery

## 2021-01-21 NOTE — Anesthesia Postprocedure Evaluation (Signed)
Anesthesia Post Note  Patient: Alejos Reinhardt  Procedure(s) Performed: [place] INCISIONAL  Patient location during evaluation: PACU Anesthesia Type: General Level of consciousness: awake and alert Pain management: pain level controlled Vital Signs Assessment: post-procedure vital signs reviewed and stable Respiratory status: spontaneous breathing, nonlabored ventilation, respiratory function stable and patient connected to nasal cannula oxygen Cardiovascular status: blood pressure returned to baseline and stable Postop Assessment: no apparent nausea or vomiting Anesthetic complications: no   No notable events documented.   Last Vitals:  Vitals:   01/20/21 1550 01/20/21 1600  BP: 131/87 (!) 130/92  Pulse: 69 69  Resp: 16 16  Temp:  36.4 C  SpO2: 90% 92%    Last Pain:  Vitals:   01/20/21 1600  TempSrc: Temporal  PainSc: 0-No pain                 Jesusa Stenerson M Shontae Rosiles

## 2021-02-04 ENCOUNTER — Encounter: Payer: Medicare Other | Admitting: Surgery

## 2021-02-11 ENCOUNTER — Encounter: Payer: Medicare Other | Admitting: Surgery

## 2021-02-26 ENCOUNTER — Other Ambulatory Visit: Payer: Self-pay | Admitting: Nephrology

## 2021-02-26 DIAGNOSIS — N399 Disorder of urinary system, unspecified: Secondary | ICD-10-CM

## 2021-02-26 DIAGNOSIS — N179 Acute kidney failure, unspecified: Secondary | ICD-10-CM

## 2021-02-26 DIAGNOSIS — N1330 Unspecified hydronephrosis: Secondary | ICD-10-CM

## 2021-03-04 ENCOUNTER — Emergency Department: Payer: Medicare Other

## 2021-03-04 ENCOUNTER — Inpatient Hospital Stay
Admission: EM | Admit: 2021-03-04 | Discharge: 2021-03-09 | DRG: 371 | Disposition: A | Discharge status: Home / Self Care | Payer: Medicare Other | Attending: Internal Medicine | Admitting: Internal Medicine

## 2021-03-04 ENCOUNTER — Other Ambulatory Visit: Payer: Self-pay

## 2021-03-04 DIAGNOSIS — I10 Essential (primary) hypertension: Secondary | ICD-10-CM | POA: Diagnosis present

## 2021-03-04 DIAGNOSIS — I959 Hypotension, unspecified: Secondary | ICD-10-CM | POA: Diagnosis present

## 2021-03-04 DIAGNOSIS — Z79899 Other long term (current) drug therapy: Secondary | ICD-10-CM

## 2021-03-04 DIAGNOSIS — Z992 Dependence on renal dialysis: Secondary | ICD-10-CM | POA: Diagnosis not present

## 2021-03-04 DIAGNOSIS — I4891 Unspecified atrial fibrillation: Secondary | ICD-10-CM | POA: Diagnosis present

## 2021-03-04 DIAGNOSIS — E871 Hypo-osmolality and hyponatremia: Secondary | ICD-10-CM | POA: Diagnosis present

## 2021-03-04 DIAGNOSIS — D72829 Elevated white blood cell count, unspecified: Secondary | ICD-10-CM | POA: Diagnosis present

## 2021-03-04 DIAGNOSIS — I252 Old myocardial infarction: Secondary | ICD-10-CM

## 2021-03-04 DIAGNOSIS — N186 End stage renal disease: Secondary | ICD-10-CM | POA: Diagnosis present

## 2021-03-04 DIAGNOSIS — R195 Other fecal abnormalities: Secondary | ICD-10-CM | POA: Diagnosis present

## 2021-03-04 DIAGNOSIS — R1084 Generalized abdominal pain: Secondary | ICD-10-CM | POA: Diagnosis not present

## 2021-03-04 DIAGNOSIS — N2581 Secondary hyperparathyroidism of renal origin: Secondary | ICD-10-CM | POA: Diagnosis present

## 2021-03-04 DIAGNOSIS — Z9114 Patient's other noncompliance with medication regimen: Secondary | ICD-10-CM

## 2021-03-04 DIAGNOSIS — K652 Spontaneous bacterial peritonitis: Secondary | ICD-10-CM | POA: Diagnosis present

## 2021-03-04 DIAGNOSIS — E785 Hyperlipidemia, unspecified: Secondary | ICD-10-CM | POA: Diagnosis present

## 2021-03-04 DIAGNOSIS — J438 Other emphysema: Secondary | ICD-10-CM | POA: Diagnosis present

## 2021-03-04 DIAGNOSIS — J439 Emphysema, unspecified: Secondary | ICD-10-CM | POA: Diagnosis present

## 2021-03-04 DIAGNOSIS — D75839 Thrombocytosis, unspecified: Secondary | ICD-10-CM | POA: Diagnosis present

## 2021-03-04 DIAGNOSIS — R34 Anuria and oliguria: Secondary | ICD-10-CM | POA: Diagnosis present

## 2021-03-04 DIAGNOSIS — T8571XA Infection and inflammatory reaction due to peritoneal dialysis catheter, initial encounter: Secondary | ICD-10-CM | POA: Diagnosis not present

## 2021-03-04 DIAGNOSIS — Z8551 Personal history of malignant neoplasm of bladder: Secondary | ICD-10-CM

## 2021-03-04 DIAGNOSIS — Z20822 Contact with and (suspected) exposure to covid-19: Secondary | ICD-10-CM | POA: Diagnosis present

## 2021-03-04 DIAGNOSIS — F17210 Nicotine dependence, cigarettes, uncomplicated: Secondary | ICD-10-CM | POA: Diagnosis present

## 2021-03-04 DIAGNOSIS — N1330 Unspecified hydronephrosis: Secondary | ICD-10-CM | POA: Diagnosis present

## 2021-03-04 DIAGNOSIS — Z951 Presence of aortocoronary bypass graft: Secondary | ICD-10-CM

## 2021-03-04 DIAGNOSIS — U071 COVID-19: Secondary | ICD-10-CM | POA: Diagnosis not present

## 2021-03-04 DIAGNOSIS — G8929 Other chronic pain: Secondary | ICD-10-CM | POA: Diagnosis not present

## 2021-03-04 DIAGNOSIS — E782 Mixed hyperlipidemia: Secondary | ICD-10-CM | POA: Diagnosis present

## 2021-03-04 DIAGNOSIS — D631 Anemia in chronic kidney disease: Secondary | ICD-10-CM | POA: Diagnosis present

## 2021-03-04 DIAGNOSIS — K659 Peritonitis, unspecified: Secondary | ICD-10-CM | POA: Diagnosis not present

## 2021-03-04 DIAGNOSIS — R531 Weakness: Secondary | ICD-10-CM | POA: Diagnosis present

## 2021-03-04 DIAGNOSIS — D494 Neoplasm of unspecified behavior of bladder: Secondary | ICD-10-CM | POA: Diagnosis present

## 2021-03-04 DIAGNOSIS — R109 Unspecified abdominal pain: Secondary | ICD-10-CM | POA: Diagnosis not present

## 2021-03-04 DIAGNOSIS — Z7982 Long term (current) use of aspirin: Secondary | ICD-10-CM

## 2021-03-04 DIAGNOSIS — T8571XD Infection and inflammatory reaction due to peritoneal dialysis catheter, subsequent encounter: Secondary | ICD-10-CM | POA: Diagnosis not present

## 2021-03-04 DIAGNOSIS — I2510 Atherosclerotic heart disease of native coronary artery without angina pectoris: Secondary | ICD-10-CM | POA: Diagnosis present

## 2021-03-04 DIAGNOSIS — N189 Chronic kidney disease, unspecified: Secondary | ICD-10-CM | POA: Diagnosis present

## 2021-03-04 DIAGNOSIS — Z87442 Personal history of urinary calculi: Secondary | ICD-10-CM

## 2021-03-04 DIAGNOSIS — E8809 Other disorders of plasma-protein metabolism, not elsewhere classified: Secondary | ICD-10-CM | POA: Diagnosis present

## 2021-03-04 DIAGNOSIS — I120 Hypertensive chronic kidney disease with stage 5 chronic kidney disease or end stage renal disease: Secondary | ICD-10-CM | POA: Diagnosis present

## 2021-03-04 DIAGNOSIS — I482 Chronic atrial fibrillation, unspecified: Secondary | ICD-10-CM | POA: Diagnosis present

## 2021-03-04 DIAGNOSIS — A419 Sepsis, unspecified organism: Secondary | ICD-10-CM | POA: Diagnosis not present

## 2021-03-04 LAB — HEPATIC FUNCTION PANEL
ALT: 24 U/L (ref 0–44)
AST: 23 U/L (ref 15–41)
Albumin: 1.8 g/dL — ABNORMAL LOW (ref 3.5–5.0)
Alkaline Phosphatase: 49 U/L (ref 38–126)
Bilirubin, Direct: <0.1 mg/dL (ref 0.0–0.2)
Total Bilirubin: 0.7 mg/dL (ref 0.3–1.2)
Total Protein: 5.3 g/dL — ABNORMAL LOW (ref 6.5–8.1)

## 2021-03-04 LAB — CBC
HCT: 35.7 % — ABNORMAL LOW (ref 39.0–52.0)
Hemoglobin: 11.1 g/dL — ABNORMAL LOW (ref 13.0–17.0)
MCH: 26.9 pg (ref 26.0–34.0)
MCHC: 31.1 g/dL (ref 30.0–36.0)
MCV: 86.4 fL (ref 80.0–100.0)
Platelets: 617 10*3/uL — ABNORMAL HIGH (ref 150–400)
RBC: 4.13 MIL/uL — ABNORMAL LOW (ref 4.22–5.81)
RDW: 14.7 % (ref 11.5–15.5)
WBC: 16.9 10*3/uL — ABNORMAL HIGH (ref 4.0–10.5)
nRBC: 0 % (ref 0.0–0.2)

## 2021-03-04 LAB — BASIC METABOLIC PANEL
Anion gap: 18 — ABNORMAL HIGH (ref 5–15)
BUN: 71 mg/dL — ABNORMAL HIGH (ref 8–23)
CO2: 24 mmol/L (ref 22–32)
Calcium: 5.9 mg/dL — CL (ref 8.9–10.3)
Chloride: 93 mmol/L — ABNORMAL LOW (ref 98–111)
Creatinine, Ser: 11.07 mg/dL — ABNORMAL HIGH (ref 0.61–1.24)
GFR, Estimated: 5 mL/min — ABNORMAL LOW (ref >60)
Glucose, Bld: 96 mg/dL (ref 70–99)
Potassium: 3.2 mmol/L — ABNORMAL LOW (ref 3.5–5.1)
Sodium: 135 mmol/L (ref 135–145)

## 2021-03-04 LAB — PHOSPHORUS: Phosphorus: 10.7 mg/dL — ABNORMAL HIGH (ref 2.5–4.6)

## 2021-03-04 LAB — RESP PANEL BY RT-PCR (FLU A&B, COVID) ARPGX2
Influenza A by PCR: NEGATIVE
Influenza B by PCR: NEGATIVE
SARS Coronavirus 2 by RT PCR: NEGATIVE

## 2021-03-04 LAB — TROPONIN I (HIGH SENSITIVITY)
Troponin I (High Sensitivity): 30 ng/L — ABNORMAL HIGH (ref <18)
Troponin I (High Sensitivity): 31 ng/L — ABNORMAL HIGH (ref <18)

## 2021-03-04 LAB — PROCALCITONIN: Procalcitonin: 2.67 ng/mL

## 2021-03-04 LAB — LACTIC ACID, PLASMA
Lactic Acid, Venous: 1.7 mmol/L (ref 0.5–1.9)
Lactic Acid, Venous: 1.5 mmol/L (ref 0.5–1.9)

## 2021-03-04 LAB — LIPASE, BLOOD: Lipase: 30 U/L (ref 11–51)

## 2021-03-04 LAB — MAGNESIUM: Magnesium: 1.7 mg/dL (ref 1.7–2.4)

## 2021-03-04 MED ORDER — ACETAMINOPHEN 325 MG PO TABS
650.0000 mg | ORAL_TABLET | Freq: Four times a day (QID) | ORAL | Status: DC | PRN
  Administered 2021-03-05 – 2021-03-06 (×2): 650 mg via ORAL
  Filled 2021-03-04 (×3): qty 2

## 2021-03-04 MED ORDER — SODIUM CHLORIDE 0.9 % IV BOLUS: 1000.0000 mL | Freq: Once | INTRAVENOUS | Status: DC

## 2021-03-04 MED ORDER — OXYCODONE HCL 5 MG PO TABS
5.0000 mg | ORAL_TABLET | Freq: Four times a day (QID) | ORAL | Status: AC | PRN
  Administered 2021-03-04 – 2021-03-05 (×3): 5 mg via ORAL
  Filled 2021-03-04 (×3): qty 1

## 2021-03-04 MED ORDER — ATORVASTATIN CALCIUM 20 MG PO TABS
40.0000 mg | ORAL_TABLET | Freq: Every day | ORAL | Status: DC
  Administered 2021-03-04 – 2021-03-08 (×5): 40 mg via ORAL
  Filled 2021-03-04 (×5): qty 2

## 2021-03-04 MED ORDER — VANCOMYCIN HCL IN DEXTROSE 1-5 GM/200ML-% IV SOLN
1000.0000 mg | Freq: Once | INTRAVENOUS | Status: AC
  Administered 2021-03-04: 1000 mg via INTRAVENOUS
  Filled 2021-03-04: qty 200

## 2021-03-04 MED ORDER — CEFTAZIDIME 1 G IJ SOLR
1.0000 g | Freq: Once | INTRAMUSCULAR | Status: AC
  Administered 2021-03-04: 1 g via INTRAMUSCULAR
  Filled 2021-03-04: qty 1

## 2021-03-04 MED ORDER — HEPARIN SODIUM (PORCINE) 5000 UNIT/ML IJ SOLN
5000.0000 [IU] | Freq: Three times a day (TID) | INTRAMUSCULAR | Status: DC
  Administered 2021-03-05 – 2021-03-09 (×12): 5000 [IU] via SUBCUTANEOUS
  Filled 2021-03-04 (×12): qty 1

## 2021-03-04 MED ORDER — ACETAMINOPHEN 650 MG RE SUPP: 650.0000 mg | Freq: Four times a day (QID) | RECTAL | Status: DC | PRN

## 2021-03-04 MED ORDER — ONDANSETRON HCL 4 MG PO TABS: 4.0000 mg | ORAL_TABLET | Freq: Four times a day (QID) | ORAL | Status: DC | PRN

## 2021-03-04 MED ORDER — ONDANSETRON HCL 4 MG/2ML IJ SOLN: 4.0000 mg | Freq: Four times a day (QID) | INTRAMUSCULAR | Status: DC | PRN

## 2021-03-04 MED ORDER — MORPHINE SULFATE (PF) 2 MG/ML IV SOLN
0.5000 mg | INTRAVENOUS | Status: AC | PRN
  Administered 2021-03-06 – 2021-03-08 (×5): 0.5 mg via INTRAVENOUS
  Filled 2021-03-04 (×5): qty 1

## 2021-03-04 NOTE — ED Triage Notes (Addendum)
Pt presents to ER via ems from home c/o weakness and "kidney pain."  Pt states he does peritoneal dialysis at home and was only able to do around 3 out of 8 hrs of treatment last night and is more weak today than normal.  Pt also states he was dx with kidney infection a week ago and has been taking abx for it. Ems reports BP of 90/50 in route. Pt A&O x4 at this time.  Pt states he was unable to complete tx d/t slow drain with his machine.

## 2021-03-04 NOTE — ED Provider Notes (Signed)
[facility] Provider Note    Event Date/Time   First MD Initiated Contact with Patient 03/04/21 2011     (approximate)   History   Weakness   HPI  Hudsen Fei is a 66 y.o. male with past medical history of CAD, status post MI, end-stage renal disease on peritoneal dialysis presents with abdominal pain and weakness.  Patient tells me that he has had cloudy effluent from the PD catheter for about a month.  He has been getting intraperitoneal antibiotics at dialysis.  Continues to have cloudy effluent is having ongoing pain.  Has not improved over the last month.  Denies fevers.  Feels overall weak and has decreased p.o. intake.  Also with some cough and shortness of breath.  Denies chest pain.  Does make a small amount of urine.   Viewed notes from outpatient nephrology, patient has been getting intraperitoneal vancomycin.  Culture from the beginning of this month grew Enterobacter which was pansensitive.  Spoke with Dr. Zollie Scale who notes that the patient had cultures redrawn again today and that are in process.    Past Medical History:  Diagnosis Date   Bladder tumor    Cancer [facility])    Bladder   Chronic kidney disease    renal insufficiency   Coronary artery disease    History of kidney stones    Hypertension    Myocardial infarction [facility])    Wears glasses     Patient Active Problem List   Diagnosis Date Noted   Leukocytosis 03/05/2021   Spontaneous bacterial peritonitis ([place]) 03/04/2021   Incisional hernia, without obstruction or gangrene    PD catheter dysfunction ([place]) 04/13/2020   Chronic kidney disease due to hypertension 01/30/2020   Hyperparathyroidism due to renal insufficiency ([place]) 01/30/2020   Acute peritonitis ([place]) 01/08/2020   Hypotension 01/04/2020   Anemia in ESRD (end-stage renal disease) ([place]) 01/04/2020   Hyperlipidemia 01/04/2020   Mass of left side of neck 01/04/2020   Senile purpura ([place]) 01/04/2020   Chronic HFrEF (heart  failure with reduced ejection fraction) (H[facility])    Hydroureteronephrosis    Atrial fibrillation ([place]) 01/05/2019   ESRD (end stage renal disease) ([place]) 01/05/2019   Anemia in chronic kidney disease ([facility]) 01/05/2019   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   Presence of aortocoronary bypass graft 12/27/2018   Emphysema lung ([place]) 04/14/2018   Bilateral hydronephrosis 04/03/2018   Hypertension 03/27/2018   Cigarette smoker 03/20/2018   History of bladder cancer 03/20/2018     Physical Exam  Triage Vital Signs: ED Triage Vitals  Enc Vitals Group     BP 03/04/21 1953 91/60     Pulse Rate 03/04/21 1953 (!) 102     Resp 03/04/21 1953 20     Temp 03/04/21 1953 98.1 F (36.7 C)     Temp Source 03/04/21 1953 Oral     SpO2 03/04/21 1953 95 %     Weight 03/04/21 1952 130 lb (59 kg)     Height 03/04/21 1952 5\' 3"  (1.6 m)     Head Circumference --      Peak Flow --      Pain Score 03/04/21 1952 6     Pain Loc --      Pain Edu? --      Excl. in [place]? --     Most recent vital signs: Vitals:   03/04/21 2300 03/04/21 2330  BP: 109/82 116/70  Pulse: 91 94  Resp:    Temp:  SpO2: 96% 95%     General: Awake, no distress.  CV:  Good peripheral perfusion.  Resp:  Normal effort.  Abd:  No distention.  PD catheter in place, no surrounding erythema, abdomen is mildly tender throughout but nonfocal Neuro:             Awake, Alert, Oriented x 3  Other:     ED Results / Procedures / Treatments  Labs (all labs ordered are listed, but only abnormal results are displayed) Labs Reviewed  BASIC METABOLIC PANEL - Abnormal; Notable for the following components:      Result Value   Potassium 3.2 (*)    Chloride 93 (*)    BUN 71 (*)    Creatinine, Ser 11.07 (*)    Calcium 5.9 (*)    GFR, Estimated 5 (*)    Anion gap 18 (*)    All other components within normal limits  CBC - Abnormal; Notable for the following components:   WBC 16.9 (*)    RBC 4.13 (*)    Hemoglobin 11.1 (*)    HCT 35.7 (*)     Platelets 617 (*)    All other components within normal limits  PHOSPHORUS - Abnormal; Notable for the following components:   Phosphorus 10.7 (*)    All other components within normal limits  HEPATIC FUNCTION PANEL - Abnormal; Notable for the following components:   Total Protein 5.3 (*)    Albumin 1.8 (*)    All other components within normal limits  TROPONIN I (HIGH SENSITIVITY) - Abnormal; Notable for the following components:   Troponin I (High Sensitivity) 30 (*)    All other components within normal limits  TROPONIN I (HIGH SENSITIVITY) - Abnormal; Notable for the following components:   Troponin I (High Sensitivity) 31 (*)    All other components within normal limits  RESP PANEL BY RT-PCR (FLU A&B, COVID) ARPGX2  CULTURE, BLOOD (ROUTINE X 2)  CULTURE, BLOOD (ROUTINE X 2)  C DIFFICILE QUICK SCREEN W PCR REFLEX    GASTROINTESTINAL PANEL BY PCR, STOOL (REPLACES STOOL CULTURE)  MAGNESIUM  LACTIC ACID, PLASMA  LACTIC ACID, PLASMA  LIPASE, BLOOD  PROCALCITONIN  URINALYSIS, ROUTINE W REFLEX MICROSCOPIC  BASIC METABOLIC PANEL  MAGNESIUM  PHOSPHORUS  CBC WITH DIFFERENTIAL/PLATELET  PROTIME-INR  CORTISOL-AM, BLOOD  HIV ANTIBODY (ROUTINE TESTING W REFLEX)     EKG  Sinus tachycardia, right axis deviation, prolonged QT interval, no acute ischemic changes   RADIOLOGY I reviewed the CXR which does not show any acute cardiopulmonary process; agree with radiology report     PROCEDURES:  Critical Care performed: No  Procedures  The patient is on the cardiac monitor to evaluate for evidence of arrhythmia and/or significant heart rate changes.   MEDICATIONS ORDERED IN ED: Medications  sodium chloride 0.9 % bolus 1,000 mL (1,000 mLs Intravenous Not Given 03/04/21 2112)  atorvastatin (LIPITOR) tablet 40 mg (40 mg Oral Given 03/04/21 2346)  acetaminophen (TYLENOL) tablet 650 mg (has no administration in time range)    Or  acetaminophen (TYLENOL) suppository 650 mg (has no  administration in time range)  ondansetron (ZOFRAN) tablet 4 mg (has no administration in time range)    Or  ondansetron (ZOFRAN) injection 4 mg (has no administration in time range)  heparin injection 5,000 Units (5,000 Units Subcutaneous Patient Refused/Not Given 03/05/21 0001)  oxyCODONE (Oxy IR/ROXICODONE) immediate release tablet 5 mg (5 mg Oral Given 03/04/21 2346)  morphine 2 MG/ML injection 0.5 mg (has no  administration in time range)  vancomycin (VANCOCIN) IVPB 1000 mg/200 mL premix (0 mg Intravenous Stopped 03/04/21 2213)  cefTAZidime (FORTAZ) injection 1 g (1 g Intramuscular Given 03/04/21 2254)     IMPRESSION / MDM / ASSESSMENT AND PLAN / ED COURSE  I reviewed the triage vital signs and the nursing notes.                              Differential diagnosis includes, but is not limited to, peritonitis, bacteremia, sepsis, UTI, pneumonia, viral syndrome  Is a 66 year old male on peritoneal dialysis who has been treated as an outpatient for peritonitis who presents with ongoing abdominal pain and weakness.  Blood pressures borderline low 90s over 60s and is initially tachycardic.  Patient appears chronically ill but nontoxic on exam he is a white count of 17.  His BMP is notable for low calcium, mildly low potassium as well.  Does have an anion gap of 18.  Notably patient has had decreased flow from the PD over the last 3 days.  Discussed with Dr. Zollie Scale who after speaking with Dr. Juleen China tells me that there will repeat cultures done today so we do not need to get another sample of the fluid.  He recommends dosing with Vanco and ceftaz now 1 g each and admission.  We will give the patient a liter of fluids and check additional labs including LFTs and lipase.  Low suspicion for acute surgical process given the diffuse nature of the pain that is been going on for about a month do think this is more likely attributed to the peritonitis.  Patient will require admission.  Discussed with the  hospitalist. Clinical Course as of 03/05/21 0048  Wed Mar 04, 2021  2201 Lactic Acid, Venous: 1.7 [KM]    Clinical Course User Index [KM] Rada Hay, MD     FINAL CLINICAL IMPRESSION(S) / ED DIAGNOSES   Final diagnoses:  SBP (spontaneous bacterial peritonitis) ([place])     Rx / DC Orders   ED Discharge Orders     None        Note:  This document was prepared using Dragon voice recognition software and may include unintentional dictation errors.   Rada Hay, MD 03/05/21 [phone]

## 2021-03-04 NOTE — H&P (Addendum)
History and Physical   [name] FAO:[id number] DOB: [birth year] DOA: 03/04/2021  PCP: Pcp, No  Outpatient Specialists: Dr. Juleen China, nephrology Patient coming from: Home  I have personally briefly reviewed patient's old medical records in [place].  Chief Concern: abdominal pain   HPI: Anthony Hess is a 66 y.o. male with medical history significant for end-stage renal disease on peritoneal dialysis, history of tobacco use, hyperlipidemia, hypertension, history of medication noncompliance, anemia of chronic disease, who presents emergency department for chief concerns of abdominal pain.  He states that the abdominal pain has been ongoing for the 1.5 weeks, and it was getting really bad, prompting him to present to the emergency department for further evaluation. He states the pain is sharp, 7/10 at his peak and is consistently still a 7.  He states the pain is persistent. He endorses nausea and vomiting. He reports vomiting 2-3 times in the night for the last two weeks. He endorses diarrhea, watery, yellow. He has been having watery yellow stool for 3-4x day. This is not normal for him as he normally has one bowel movement per day.  He reports the diarrhea has been ongoing for the last 1.5 weeks.  He has been on antibiotics for the last two weeks antibiotic treatments during with his daily peritoneal dialysis treatments.   He also has a new cough that started about 1 week ago that is not productive.  He endorses generalized weakness.  He denies chest pain, shortness of breath, syncope, loss of consciousness. He is anuric at baseline.   Social history: He lives at home with his wife. He is a former tobacco user, quitting about two years ago. At his peak, he smoked 2 ppd. He started smoking at age 15. He denies recreational drug use. He is retired and formerly worked in a [employer].  Vaccination history: he is vaccinated and has received 5 total doses of Moderna,   ROS: Constitutional:  no weight change, no fever ENT/Mouth: no sore throat, no rhinorrhea Eyes: no eye pain, no vision changes Cardiovascular: no chest pain, no dyspnea,  no edema, no palpitations Respiratory: + cough, no sputum, no wheezing Gastrointestinal: + abdominal pain, + nausea, + vomiting, + diarrhea, no constipation Genitourinary: no urinary incontinence, no dysuria, no hematuria Musculoskeletal: no arthralgias, no myalgias Skin: no skin lesions, no pruritus, Neuro: + weakness, no loss of consciousness, no syncope Psych: no anxiety, no depression, + decrease appetite Heme/Lymph: no bruising, no bleeding  ED Course: Discussed with emergency medicine provider, patient requiring hospitalization for chief concerns of spontaneous bacterial peritonitis.  Vitals in the emergency department showed temperature of 98.1, respiration rate of 20, heart rate of 102, blood pressure 91/60, improved to 93/62, SPO2 of 95% on room air.  Serum sodium is 135, potassium 3.2, chloride of 93, bicarb 24, BUN of 71, serum creatinine of 11.07, nonfasting blood glucose 96.  Anion gap is 18.  GFR 5.  WBC is elevated at 16.9, hemoglobin 11.1, platelets of 617.  In the emergency department patient received vancomycin, Fortaz 1 g IM at the recommendation of nephrology.  Assessment/Plan  Principal Problem:   Spontaneous bacterial peritonitis ([place]) Active Problems:   Cigarette smoker   History of bladder cancer   Hypertension   Bilateral hydronephrosis   S/P CABG x 4   Atrial fibrillation (HCC)   ESRD (end stage renal disease) ([place])   Anemia in ESRD (end-stage renal disease) ([place])   # Abdominal pain-suspect secondary to SBP - Blood cultures x2 in  process - Status post vancomycin and ceftazidime injection 1 g IM - Continue with vancomycin per pharmacy - Patient is end-stage renal disease on hemodialysis, ceftazidime should be continued dose adjusted for end-stage renal disease - Would recommend a.m. team to consider  infectious disease consultation for further evaluation - Per EDP, nephrologist has obtained a sample of peritoneal fluid prior to patient presentation to the emergency department - A.m. team follow-up with nephrologist, Dr. Juleen China for further follow-up of peritoneal fluid culture study - Oxycodone 5 mg p.o. every 6 hours as needed for moderate pain, 1 day ordered; morphine 0.5 mg IV every 4 hours as needed for severe pain, 5 doses ordered  # End-stage renal disease on peritoneal dialysis-nephrology is aware - Resumed calcitriol 0.5 mcg daily on 03/05/2021 - Resumed PhosLo twice daily with meals  - Epic order placed for nephrology consultation  # Cough in setting of elevated procalcitonin-added azithromycin for atypical cough  # Diarrhea in setting of recent antibiotic use and markedly elevated leukocytosis - Infectious diarrhea cannot be excluded at this time - Check C. difficile and GI panel  # History of CAD status post CABG-resumed aspirin and atorvastatin - Holding home metoprolol succinate at this time   # Hyperlipidemia-atorvastatin 40 mg nightly resumed  # History of hypertension-patient currently hypotensive and or low normotensive - Holding home metoprolol succinate 50 mg daily, irbesartan 150 mg daily  # Need med reconciliation a.m. team to complete med reconciliation # History of bladder cancer status post transurethral resection  Chart reviewed.   DVT prophylaxis: Heparin 5000 units subcutaneous every 8 hours Code Status: Full code Diet: Renal Family Communication: No Disposition Plan: Pending clinical course Consults called: Nephrology Admission status: Telemetry cardiac, inpatient  Past Medical History:  Diagnosis Date   Bladder tumor    Cancer [facility])    Bladder   Chronic kidney disease    renal insufficiency   Coronary artery disease    History of kidney stones    Hypertension    Myocardial infarction ([place])    Wears glasses    Past Surgical History:   Procedure Laterality Date   CAPD INSERTION N/A 12/31/2019   Procedure: LAPAROSCOPIC INSERTION CONTINUOUS AMBULATORY PERITONEAL DIALYSIS  (CAPD) CATHETER;  Surgeon: Jules Husbands, MD;  Location: [facility] OR[facility];  Service: General;  Laterality: N/A;   CAPD REMOVAL N/A 04/10/2020   Procedure: LAPAROSCOPIC REVISION OF CONTINUOUS AMBULATORY PERITONEAL DIALYSIS  (CAPD) CATHETER;  Surgeon: Jules Husbands, MD;  Location: [facility];  Service: General;  Laterality: N/A;   CORONARY ARTERY BYPASS GRAFT N/A 12/27/2018   Procedure: CORONARY ARTERY BYPASS GRAFTING (CABG) X 4 ON PUMP USING RIGHT & LEFT INTERNAL MAMMARY ARTERY LEFT RADIAL ARTERY ENDOSCOPICALLY HARVESTED;  Surgeon: [name], MD;  Location: [place];  Service: Open Heart Surgery;  Laterality: N/A;   CYSTOSCOPY W/ RETROGRADES Bilateral 05/15/2019   Procedure: CYSTOSCOPY WITH RETROGRADE PYELOGRAM;  Surgeon: Abbie Sons, MD;  Location: [facility];  Service: Urology;  Laterality: Bilateral;   CYSTOSCOPY WITH BIOPSY N/A 05/15/2019   Procedure: CYSTOSCOPY WITH bladder BIOPSY;  Surgeon: Abbie Sons, MD;  Location: [facility];  Service: Urology;  Laterality: N/A;   DIALYSIS/PERMA CATHETER INSERTION N/A 12/28/2019   Procedure: DIALYSIS/PERMA CATHETER INSERTION;  Surgeon: Algernon Huxley, MD;  Location: [place] [facility];  Service: Cardiovascular;  Laterality: N/A;   DIALYSIS/PERMA CATHETER REMOVAL N/A 06/02/2020   Procedure: DIALYSIS/PERMA CATHETER REMOVAL;  Surgeon: Algernon Huxley, MD;  Location: [place] [facility];  Service:  Cardiovascular;  Laterality: N/A;   EXCHANGE OF A DIALYSIS CATHETER Right 04/10/2020   Procedure: EXCHANGE OF A DIALYSIS CATHETER;  Surgeon: Jules Husbands, MD;  Location: [facility];  Service: General;  Laterality: Right;   [place]  01/20/2021   Procedure: HERNIA REPAIR INCISIONAL;  Surgeon: Olean Ree, MD;  Location: [facility];  Service: General;;   IR IMAGE GUIDED DRAINAGE PERCUT CATH  PERITONEAL RETROPERIT   04/07/2020   LEFT HEART CATH AND CORONARY ANGIOGRAPHY Left 12/20/2018   Procedure: LEFT HEART CATH AND CORONARY ANGIOGRAPHY;  Surgeon: Isaias Cowman, MD;  Location: [place] [facility];  Service: Cardiovascular;  Laterality: Left;   RADIAL ARTERY HARVEST Left 12/27/2018   Procedure: ENDOSCOPIC RADIAL ARTERY HARVEST;  Surgeon: [name], MD;  Location: [place];  Service: Open Heart Surgery;  Laterality: Left;   TEE WITHOUT CARDIOVERSION N/A 12/27/2018   Procedure: TRANSESOPHAGEAL ECHOCARDIOGRAM (TEE);  Surgeon: [name], MD;  Location: [place];  Service: Open Heart Surgery;  Laterality: N/A;   TUMOR REMOVAL  2019   Bladder   Social History:  reports that he quit smoking about 2 years ago. His smoking use included cigarettes. He smoked an average of .25 packs per day. He has never used smokeless tobacco. He reports that he does not drink alcohol and does not use drugs.  No Known Allergies Family History  Family history unknown: Yes   Family history: Family history reviewed and not pertinent  Prior to Admission medications   Medication Sig Start Date End Date Taking? Authorizing Provider  acetaminophen (TYLENOL) 500 MG tablet Take 2 tablets (1,000 mg total) by mouth every 6 (six) hours as needed for mild pain. 01/20/21   Olean Ree, MD  aspirin EC 81 MG tablet Take 81 mg by mouth daily.    [name], MD  atorvastatin (LIPITOR) 40 MG tablet Take 1 tablet (40 mg total) by mouth daily. Patient not taking: Reported on 01/13/2021 01/01/20   Fritzi Mandes, MD  calcitRIOL (ROCALTROL) 0.5 MCG capsule Take 1 capsule (0.5 mcg total) by mouth daily. 01/01/20   Fritzi Mandes, MD  calcium acetate (PHOSLO) 667 MG capsule Take 2 capsules (1,334 mg total) by mouth 3 (three) times daily with meals. Patient taking differently: Take 1,334 mg by mouth 2 (two) times daily with a meal. 01/01/20   Fritzi Mandes, MD  calcium carbonate (OS-CAL - DOSED IN MG OF ELEMENTAL CALCIUM) 1250 (500 Ca) MG  tablet Take 1 tablet (1,250 mg total) by mouth 2 (two) times daily between meals. Patient not taking: Reported on 01/09/2021 01/01/20   Fritzi Mandes, MD  irbesartan (AVAPRO) 150 MG tablet Take 150 mg by mouth daily. 09/18/20   [provider]  metoprolol succinate (TOPROL-XL) 50 MG 24 hr tablet Take 1 tablet (50 mg total) by mouth daily. Take with or immediately following a meal. 01/01/20   Fritzi Mandes, MD  oxyCODONE (OXY IR/ROXICODONE) 5 MG immediate release tablet Take 1 tablet (5 mg total) by mouth every 4 (four) hours as needed for severe pain. 01/20/21   Olean Ree, MD  Vitamin D, Ergocalciferol, (DRISDOL) 1.25 MG (50000 UNIT) CAPS capsule Take 1 capsule (50,000 Units total) by mouth every 7 (seven) days. Patient taking differently: Take 50,000 Units by mouth every Saturday. 01/05/20   Fritzi Mandes, MD   Physical Exam: Vitals:   03/04/21 2130 03/04/21 2145 03/04/21 2225 03/04/21 2230  BP: 93/67  101/77 115/81  Pulse:  96 79   Resp:  18   Temp:      TempSrc:      SpO2:  97% 100% 98%  Weight:      Height:       Constitutional: appears older than chronological age, frail, cachectic, NAD, calm, comfortable Eyes: PERRL, lids and conjunctivae normal ENMT: Mucous membranes are moist. Posterior pharynx clear of any exudate or lesions. Age-appropriate dentition. Hearing appropriate Neck: normal, supple, no masses, no thyromegaly Respiratory: clear to auscultation bilaterally, + mild diffuse wheezing, no crackles. Normal respiratory effort. No accessory muscle use.  Cardiovascular: Regular rate and rhythm, no murmurs / rubs / gallops. No extremity edema. 2+ pedal pulses. No carotid bruits.  Abdomen: + Generalized tenderness, no masses palpated, no hepatosplenomegaly. Bowel sounds positive.  Peritoneal catheter in place. Musculoskeletal: no clubbing / cyanosis. No joint deformity upper and lower extremities. Good ROM, no contractures, no atrophy. Normal muscle tone.  Skin: no rashes,  lesions, ulcers. No induration Neurologic: Sensation intact. Strength 5/5 in all 4.  Psychiatric: Normal judgment and insight. Alert and oriented x 3. Normal mood.   EKG: independently reviewed, showing sinus tachycardia with rate of 106, QTc 539  Chest x-ray on Admission: I personally reviewed and I agree with radiologist reading as below.  DG Chest Portable 1 View  Result Date: 03/04/2021 CLINICAL DATA:  Shortness of breath.  Weakness. EXAM: PORTABLE CHEST 1 VIEW COMPARISON:  Radiograph 12/27/2019 FINDINGS: Post median sternotomy. Stable upper normal heart size. Unchanged mediastinal contours. No pulmonary edema, pleural effusion, or focal airspace disease. Scoliotic curvature of the spine. IMPRESSION: No acute chest findings. Electronically Signed   By: Keith Rake M.D.   On: 03/04/2021 20:50    Labs on Admission: I have personally reviewed following labs  CBC: Recent Labs  Lab 03/04/21 1958  WBC 16.9*  HGB 11.1*  HCT 35.7*  MCV 86.4  PLT 962*   Basic Metabolic Panel: Recent Labs  Lab 03/04/21 1958 03/04/21 2146  NA 135  --   K 3.2*  --   CL 93*  --   CO2 24  --   GLUCOSE 96  --   BUN 71*  --   CREATININE 11.07*  --   CALCIUM 5.9*  --   MG  --  1.7  PHOS  --  10.7*   GFR: Estimated Creatinine Clearance: 5.5 mL/min (A) (by C-G formula based on SCr of 11.07 mg/dL (H)).  Liver Function Tests: Recent Labs  Lab 03/04/21 2146  AST 23  ALT 24  ALKPHOS 49  BILITOT 0.7  PROT 5.3*  ALBUMIN 1.8*   Recent Labs  Lab 03/04/21 2146  LIPASE 30   Urine analysis:    Component Value Date/Time   COLORURINE STRAW (A) 12/27/2019 1756   APPEARANCEUR CLEAR (A) 12/27/2019 1756   APPEARANCEUR Cloudy (A) 05/08/2019 1448   LABSPEC 1.010 12/27/2019 1756   PHURINE 5.0 12/27/2019 1756   GLUCOSEU NEGATIVE 12/27/2019 1756   HGBUR NEGATIVE 12/27/2019 1756   BILIRUBINUR NEGATIVE 12/27/2019 1756   BILIRUBINUR Negative 05/08/2019 1448   KETONESUR NEGATIVE 12/27/2019 1756    PROTEINUR >=300 (A) 12/27/2019 1756   NITRITE NEGATIVE 12/27/2019 1756   LEUKOCYTESUR NEGATIVE 12/27/2019 1756   Dr. Tobie Poet [facility]  If 7PM-7AM, please contact overnight-coverage provider If 7AM-7PM, please contact day coverage provider [email or web address]  03/04/2021, 10:49 PM

## 2021-03-05 ENCOUNTER — Inpatient Hospital Stay: Payer: Medicare Other

## 2021-03-05 DIAGNOSIS — I7143 Infrarenal abdominal aortic aneurysm, without rupture: Secondary | ICD-10-CM

## 2021-03-05 DIAGNOSIS — T8571XA Infection and inflammatory reaction due to peritoneal dialysis catheter, initial encounter: Secondary | ICD-10-CM

## 2021-03-05 DIAGNOSIS — N186 End stage renal disease: Secondary | ICD-10-CM

## 2021-03-05 DIAGNOSIS — D72829 Elevated white blood cell count, unspecified: Secondary | ICD-10-CM | POA: Diagnosis present

## 2021-03-05 HISTORY — DX: Infrarenal abdominal aortic aneurysm, without rupture: I71.43

## 2021-03-05 LAB — CBC WITH DIFFERENTIAL/PLATELET
Abs Immature Granulocytes: 0.1 10*3/uL — ABNORMAL HIGH (ref 0.00–0.07)
Basophils Absolute: 0 10*3/uL (ref 0.0–0.1)
Basophils Relative: 0 %
Eosinophils Absolute: 0.2 10*3/uL (ref 0.0–0.5)
Eosinophils Relative: 1 %
HCT: 31.2 % — ABNORMAL LOW (ref 39.0–52.0)
Hemoglobin: 9.8 g/dL — ABNORMAL LOW (ref 13.0–17.0)
Immature Granulocytes: 1 %
Lymphocytes Relative: 6 %
Lymphs Abs: 0.8 10*3/uL (ref 0.7–4.0)
MCH: 27.3 pg (ref 26.0–34.0)
MCHC: 31.4 g/dL (ref 30.0–36.0)
MCV: 86.9 fL (ref 80.0–100.0)
Monocytes Absolute: 1 10*3/uL (ref 0.1–1.0)
Monocytes Relative: 7 %
Neutro Abs: 11.3 10*3/uL — ABNORMAL HIGH (ref 1.7–7.7)
Neutrophils Relative %: 85 %
Platelets: 544 10*3/uL — ABNORMAL HIGH (ref 150–400)
RBC: 3.59 MIL/uL — ABNORMAL LOW (ref 4.22–5.81)
RDW: 14.7 % (ref 11.5–15.5)
WBC: 13.3 10*3/uL — ABNORMAL HIGH (ref 4.0–10.5)
nRBC: 0 % (ref 0.0–0.2)

## 2021-03-05 LAB — BASIC METABOLIC PANEL
Anion gap: 17 — ABNORMAL HIGH (ref 5–15)
BUN: 75 mg/dL — ABNORMAL HIGH (ref 8–23)
CO2: 20 mmol/L — ABNORMAL LOW (ref 22–32)
Calcium: 5.4 mg/dL — CL (ref 8.9–10.3)
Chloride: 97 mmol/L — ABNORMAL LOW (ref 98–111)
Creatinine, Ser: 11.32 mg/dL — ABNORMAL HIGH (ref 0.61–1.24)
GFR, Estimated: 5 mL/min — ABNORMAL LOW (ref >60)
Glucose, Bld: 97 mg/dL (ref 70–99)
Potassium: 3.4 mmol/L — ABNORMAL LOW (ref 3.5–5.1)
Sodium: 134 mmol/L — ABNORMAL LOW (ref 135–145)

## 2021-03-05 LAB — MAGNESIUM: Magnesium: 1.5 mg/dL — ABNORMAL LOW (ref 1.7–2.4)

## 2021-03-05 LAB — CORTISOL-AM, BLOOD: Cortisol - AM: 15.4 ug/dL (ref 6.7–22.6)

## 2021-03-05 LAB — HIV ANTIBODY (ROUTINE TESTING W REFLEX): HIV Screen 4th Generation wRfx: NONREACTIVE

## 2021-03-05 LAB — PHOSPHORUS: Phosphorus: 11.2 mg/dL — ABNORMAL HIGH (ref 2.5–4.6)

## 2021-03-05 LAB — PROTIME-INR
INR: 1.2 (ref 0.8–1.2)
Prothrombin Time: 15.6 seconds — ABNORMAL HIGH (ref 11.4–15.2)

## 2021-03-05 MED ORDER — HEPARIN 1000 UNIT/ML FOR PERITONEAL DIALYSIS
500.0000 [IU] | INTRAMUSCULAR | Status: DC | PRN
  Filled 2021-03-05: qty 0.5

## 2021-03-05 MED ORDER — GENTAMICIN SULFATE 0.1 % EX CREA
1.0000 "application " | TOPICAL_CREAM | Freq: Every day | CUTANEOUS | Status: DC
  Administered 2021-03-08: 1 via TOPICAL
  Filled 2021-03-05 (×3): qty 15

## 2021-03-05 MED ORDER — CALCITRIOL 0.25 MCG PO CAPS
0.5000 ug | ORAL_CAPSULE | Freq: Every day | ORAL | Status: DC
  Administered 2021-03-05 – 2021-03-09 (×5): 0.5 ug via ORAL
  Filled 2021-03-05 (×5): qty 2

## 2021-03-05 MED ORDER — ASPIRIN EC 81 MG PO TBEC
81.0000 mg | DELAYED_RELEASE_TABLET | Freq: Every day | ORAL | Status: DC
  Administered 2021-03-05 – 2021-03-09 (×5): 81 mg via ORAL
  Filled 2021-03-05 (×6): qty 1

## 2021-03-05 MED ORDER — CALCIUM GLUCONATE-NACL 1-0.675 GM/50ML-% IV SOLN
1.0000 g | Freq: Once | INTRAVENOUS | Status: AC
  Administered 2021-03-05: 1000 mg via INTRAVENOUS
  Filled 2021-03-05: qty 50

## 2021-03-05 MED ORDER — SODIUM CHLORIDE 0.9 % IV SOLN
500.0000 mg | Freq: Every day | INTRAVENOUS | Status: DC
  Administered 2021-03-05: 500 mg via INTRAVENOUS
  Filled 2021-03-05: qty 5

## 2021-03-05 MED ORDER — AZITHROMYCIN 250 MG PO TABS
500.0000 mg | ORAL_TABLET | Freq: Every day | ORAL | Status: DC
  Administered 2021-03-06: 500 mg via ORAL
  Filled 2021-03-05: qty 2

## 2021-03-05 MED ORDER — VANCOMYCIN HCL 500 MG/100ML IV SOLN
500.0000 mg | Freq: Once | INTRAVENOUS | Status: AC
  Administered 2021-03-05: 500 mg via INTRAVENOUS
  Filled 2021-03-05: qty 100

## 2021-03-05 MED ORDER — IPRATROPIUM-ALBUTEROL 0.5-2.5 (3) MG/3ML IN SOLN
3.0000 mL | Freq: Four times a day (QID) | RESPIRATORY_TRACT | Status: DC | PRN
  Filled 2021-03-05 (×2): qty 3

## 2021-03-05 MED ORDER — CALCIUM ACETATE (PHOS BINDER) 667 MG PO CAPS
1334.0000 mg | ORAL_CAPSULE | Freq: Two times a day (BID) | ORAL | Status: DC
  Administered 2021-03-05 – 2021-03-06 (×4): 1334 mg via ORAL
  Filled 2021-03-05 (×6): qty 2

## 2021-03-05 MED ORDER — BENZONATATE 100 MG PO CAPS: 200.0000 mg | ORAL_CAPSULE | Freq: Three times a day (TID) | ORAL | Status: DC | PRN

## 2021-03-05 MED ORDER — MAGNESIUM SULFATE 2 GM/50ML IV SOLN
2.0000 g | Freq: Once | INTRAVENOUS | Status: AC
  Administered 2021-03-05: 2 g via INTRAVENOUS
  Filled 2021-03-05: qty 50

## 2021-03-05 MED ORDER — DELFLEX-LC/1.5% DEXTROSE 344 MOSM/L IP SOLN
INTRAPERITONEAL | Status: DC
  Filled 2021-03-05 (×6): qty 3000

## 2021-03-05 MED ORDER — VANCOMYCIN VARIABLE DOSE PER UNSTABLE RENAL FUNCTION (PHARMACIST DOSING): Status: DC

## 2021-03-05 NOTE — Progress Notes (Signed)
Pharmacy Antibiotic Note  Anthony Hess is a 66 y.o. male admitted on 03/04/2021 with Intra-abdominal infection, SBP, end-stage renal disease patient on peritoneal dialysis.  Pharmacy has been consulted for Vancomycin dosing.  Plan: Pt given initial dose of Vancomycin 1 gm.  Ordered additional 500 mg for total LD of 1500 mg per pt wt: 59 kg.  Height: 5\' 3"  (160 cm) Weight: 59 kg (130 lb) IBW/kg (Calculated) : 56.9  Temp (24hrs), Avg:98.1 F (36.7 C), Min:98.1 F (36.7 C), Max:98.1 F (36.7 C)  Recent Labs  Lab 03/04/21 1958 03/04/21 2126 03/04/21 2255  WBC 16.9*  --   --   CREATININE 11.07*  --   --   LATICACIDVEN  --  1.7 1.5    Estimated Creatinine Clearance: 5.5 mL/min (A) (by C-G formula based on SCr of 11.07 mg/dL (H)).    No Known Allergies  Antimicrobials this admission: 1/12 Azithromycin >> x 5 doses 1/11 Vancomycin >> 1/11 Ceftazidime >> x 1  Microbiology results: 1/11 BCx: Pending 1/11 GI Panel: Pending  1/11 C. Diff:  Pending   Thank you for allowing pharmacy to be a part of this patients care.  Renda Rolls, PharmD, [facility] [address] 03/05/2021 2:06 AM

## 2021-03-05 NOTE — Consult Note (Addendum)
NAME: Anthony Hess  DOB: Nov 12, 1955  MRN: [id number]  Date/Time: 03/05/2021 7:56 PM  REQUESTING PROVIDER: Dr. Jimmye Norman Subjective:  REASON FOR CONSULT: Peritoneal infection secondary to peritoneal dialysis catheter ? Anthony Hess is a 66 y.o. male with a history of end-stage renal disease on peritoneal dialysis, hyperlipidemia, hypertension, CAD status post CABG Anemia of chronic disease presents to the ED with chief complaint of abdominal pain, diarrhea for 2 weeks , says he has 3-4 yellow stools/day, no blood or mucus, also has been having sporadic vomiting. Coughing a lot and has pain at the site of the PD catheter esepcially on breathing and coughing No fever . Patient has had a complications with peritoneal dialysis catheter for the past year He first was started on hemodialysis in November 2021.  He had a peritoneal dialysis catheter placed on 12/31/2019 and at the end of the month he started using the PD catheter.  Then in February 2022 was having some trouble with the PD catheter and underwent revision of the catheter with removal of intra-abdominal blockage which was secondary to omentum and underwent omentopexy.  This was done at [facility].  March 2022 he saw [place] and the old catheter was removed and a new was placed.  During that procedure he also had lysis of adhesions and omentopexy.  Then in July 2022 he underwent revision of the PD catheter because of malfunctioning. 01/06/22 Peritoneal fluid coag neg staph He felt the catheter was migrating and thought he had a hernia and on 01/20/2021 he underwent repair of the defect of the anterior rectus sheath at [facility].   01/26/22 peritoneal fluid culture positive for enterobacter cloacae complex pretty sensitive bacteria. Cell count was 3374 (75% N). Not sure what antibiotic was given He now presents to the ED on 03/04/2021 with abdominal pain.  he has had this pain for nearly a week and a half.  In the ED BP was 112/70, temperature  98.1, pulse 94, respiratory 18 and sats 95%.  WBC was 16.9, Hb 11.1, platelets 617 and creatinine was 11.  CT abdomen showed small amount of intraperitoneal fluid without loculated collection.  The catheter was in place. Blood culture was sent. He was started on vancomycin ceftazidime and azithromycin. I am seeing the patient for peritonitis Past Medical History:  Diagnosis Date   Bladder tumor    Cancer [facility])    Bladder   Chronic kidney disease    renal insufficiency   Coronary artery disease    History of kidney stones    Hypertension    Myocardial infarction [facility])    Wears glasses     Past Surgical History:  Procedure Laterality Date   CAPD INSERTION N/A 12/31/2019   Procedure: LAPAROSCOPIC INSERTION CONTINUOUS AMBULATORY PERITONEAL DIALYSIS  (CAPD) CATHETER;  Surgeon: Jules Husbands, MD;  Location: [facility];  Service: General;  Laterality: N/A;   CAPD REMOVAL N/A 04/10/2020   Procedure: LAPAROSCOPIC REVISION OF CONTINUOUS AMBULATORY PERITONEAL DIALYSIS  (CAPD) CATHETER;  Surgeon: Jules Husbands, MD;  Location: [facility];  Service: General;  Laterality: N/A;   CORONARY ARTERY BYPASS GRAFT N/A 12/27/2018   Procedure: CORONARY ARTERY BYPASS GRAFTING (CABG) X 4 ON PUMP USING RIGHT & LEFT INTERNAL MAMMARY ARTERY LEFT RADIAL ARTERY ENDOSCOPICALLY HARVESTED;  Surgeon: [name], MD;  Location: [place];  Service: Open Heart Surgery;  Laterality: N/A;   CYSTOSCOPY W/ RETROGRADES Bilateral 05/15/2019   Procedure: CYSTOSCOPY WITH RETROGRADE PYELOGRAM;  Surgeon: Abbie Sons, MD;  Location: [facility];  Service: Urology;  Laterality: Bilateral;   CYSTOSCOPY WITH BIOPSY N/A 05/15/2019   Procedure: CYSTOSCOPY WITH bladder BIOPSY;  Surgeon: Abbie Sons, MD;  Location: [facility];  Service: Urology;  Laterality: N/A;   DIALYSIS/PERMA CATHETER INSERTION N/A 12/28/2019   Procedure: DIALYSIS/PERMA CATHETER INSERTION;  Surgeon: Algernon Huxley, MD;  Location: [place] [facility];  Service:  Cardiovascular;  Laterality: N/A;   DIALYSIS/PERMA CATHETER REMOVAL N/A 06/02/2020   Procedure: DIALYSIS/PERMA CATHETER REMOVAL;  Surgeon: Algernon Huxley, MD;  Location: [place] [facility];  Service: Cardiovascular;  Laterality: N/A;   EXCHANGE OF A DIALYSIS CATHETER Right 04/10/2020   Procedure: EXCHANGE OF A DIALYSIS CATHETER;  Surgeon: Jules Husbands, MD;  Location: [facility];  Service: General;  Laterality: Right;   [place]  01/20/2021   Procedure: HERNIA REPAIR INCISIONAL;  Surgeon: Olean Ree, MD;  Location: [facility];  Service: General;;   IR IMAGE GUIDED DRAINAGE PERCUT CATH  PERITONEAL RETROPERIT  04/07/2020   LEFT HEART CATH AND CORONARY ANGIOGRAPHY Left 12/20/2018   Procedure: LEFT HEART CATH AND CORONARY ANGIOGRAPHY;  Surgeon: Isaias Cowman, MD;  Location: [place] [facility];  Service: Cardiovascular;  Laterality: Left;   RADIAL ARTERY HARVEST Left 12/27/2018   Procedure: ENDOSCOPIC RADIAL ARTERY HARVEST;  Surgeon: [name], MD;  Location: [place];  Service: Open Heart Surgery;  Laterality: Left;   TEE WITHOUT CARDIOVERSION N/A 12/27/2018   Procedure: TRANSESOPHAGEAL ECHOCARDIOGRAM (TEE);  Surgeon: [name], MD;  Location: [place];  Service: Open Heart Surgery;  Laterality: N/A;   TUMOR REMOVAL  2019   Bladder    Social History   Socioeconomic History   Marital status: Married    Spouse name: Not on file   Number of children: Not on file   Years of education: Not on file   Highest education level: Not on file  Occupational History   Not on file  Tobacco Use   Smoking status: Former    Packs/day: 0.25    Types: Cigarettes    Quit date: 02/19/2019    Years since quitting: 2.0   Smokeless tobacco: Never  Vaping Use   Vaping Use: Never used  Substance and Sexual Activity   Alcohol use: Never   Drug use: Never   Sexual activity: Yes  Other Topics Concern   Not on file  Social History Narrative   Not on file   Social Determinants  of Health   Financial Resource Strain: Not on file  Food Insecurity: Not on file  Transportation Needs: Not on file  Physical Activity: Not on file  Stress: Not on file  Social Connections: Not on file  Intimate Partner Violence: Not on file    Family History  Family history unknown: Yes   No Known Allergies I? Current Facility-Administered Medications  Medication Dose Route Frequency Provider Last Rate Last Admin   acetaminophen (TYLENOL) tablet 650 mg  650 mg Oral Q6H PRN Cox, Amy N, DO   650 mg at 03/05/21 0956   Or   acetaminophen (TYLENOL) suppository 650 mg  650 mg Rectal Q6H PRN Cox, Amy N, DO       aspirin EC tablet 81 mg  81 mg Oral Daily Cox, Amy N, DO   81 mg at 03/05/21 0956   atorvastatin (LIPITOR) tablet 40 mg  40 mg Oral QHS Cox, Amy N, DO   40 mg at 03/04/21 2346   [START ON 03/06/2021] azithromycin (ZITHROMAX) tablet 500 mg  500  mg Oral Daily Benita Gutter, RPH       benzonatate (TESSALON) capsule 200 mg  200 mg Oral TID PRN Wyvonnia Dusky, MD       calcitRIOL (ROCALTROL) capsule 0.5 mcg  0.5 mcg Oral Daily Cox, Amy N, DO   0.5 mcg at 03/05/21 0956   calcium acetate (PHOSLO) capsule 1,334 mg  1,334 mg Oral BID WC Cox, Amy N, DO   1,334 mg at 03/05/21 1749   dialysis solution 1.5% low-MG/low-CA dianeal solution   Intraperitoneal Q24H Colon Flattery, NP   Held at 03/05/21 1429   gentamicin cream (GARAMYCIN) 0.1 % 1 application  1 application Topical Daily Breeze, Benancio Deeds, NP       heparin 1000 unit/ml injection 500 Units  500 Units Intraperitoneal PRN Colon Flattery, NP       heparin injection 5,000 Units  5,000 Units Subcutaneous Q8H Cox, Amy N, DO   5,000 Units at 03/05/21 1320   ipratropium-albuterol (DUONEB) 0.5-2.5 (3) MG/3ML nebulizer solution 3 mL  3 mL Nebulization Q6H PRN Cox, Amy N, DO       morphine 2 MG/ML injection 0.5 mg  0.5 mg Intravenous Q4H PRN Cox, Amy N, DO       ondansetron (ZOFRAN) tablet 4 mg  4 mg Oral Q6H PRN Cox, Amy N, DO        Or   ondansetron (ZOFRAN) injection 4 mg  4 mg Intravenous Q6H PRN Cox, Amy N, DO       oxyCODONE (Oxy IR/ROXICODONE) immediate release tablet 5 mg  5 mg Oral Q6H PRN Cox, Amy N, DO   5 mg at 03/05/21 1345   sodium chloride 0.9 % bolus 1,000 mL  1,000 mL Intravenous Once Cox, Amy N, DO       vancomycin variable dose per unstable renal function (pharmacist dosing)   Does not apply See admin instructions Renda Rolls, [facility]       Current Outpatient Medications  Medication Sig Dispense Refill   acetaminophen (TYLENOL) 500 MG tablet Take 2 tablets (1,000 mg total) by mouth every 6 (six) hours as needed for mild pain.     aspirin EC 81 MG tablet Take 81 mg by mouth daily.     atorvastatin (LIPITOR) 40 MG tablet Take 1 tablet (40 mg total) by mouth daily. 30 tablet 1   benzonatate (TESSALON) 200 MG capsule Take 200 mg by mouth 3 (three) times daily as needed.     calcitRIOL (ROCALTROL) 0.5 MCG capsule Take 1 capsule (0.5 mcg total) by mouth daily. 30 capsule 1   calcium acetate (PHOSLO) 667 MG capsule Take 2 capsules (1,334 mg total) by mouth 3 (three) times daily with meals. (Patient taking differently: Take 1,334 mg by mouth 2 (two) times daily with a meal.) 90 capsule 1   calcium carbonate (OS-CAL - DOSED IN MG OF ELEMENTAL CALCIUM) 1250 (500 Ca) MG tablet Take 1 tablet (1,250 mg total) by mouth 2 (two) times daily between meals. 60 tablet 1   hydrALAZINE (APRESOLINE) 10 MG tablet Take 10 mg by mouth 3 (three) times daily.     irbesartan (AVAPRO) 150 MG tablet Take 150 mg by mouth daily.     metoprolol succinate (TOPROL-XL) 50 MG 24 hr tablet Take 1 tablet (50 mg total) by mouth daily. Take with or immediately following a meal. 30 tablet 1   ondansetron (ZOFRAN-ODT) 8 MG disintegrating tablet Take 8 mg by mouth every 8 (eight) hours as needed.  VELPHORO 500 MG chewable tablet Chew 1,000 mg by mouth 3 (three) times daily.     Vitamin D, Ergocalciferol, (DRISDOL) 1.25 MG (50000 UNIT) CAPS capsule  Take 1 capsule (50,000 Units total) by mouth every 7 (seven) days. (Patient taking differently: Take 50,000 Units by mouth every Saturday.) 5 capsule 1   nystatin (MYCOSTATIN) 500000 units TABS tablet Take 1 tablet by mouth 3 (three) times daily. (Patient not taking: Reported on 03/05/2021)     oxyCODONE (OXY IR/ROXICODONE) 5 MG immediate release tablet Take 1 tablet (5 mg total) by mouth every 4 (four) hours as needed for severe pain. (Patient not taking: Reported on 03/05/2021) 30 tablet 0     Abtx:  Anti-infectives (From admission, onward)    Start     Dose/Rate Route Frequency Ordered Stop   03/06/21 1000  azithromycin (ZITHROMAX) tablet 500 mg        500 mg Oral Daily 03/05/21 0959 03/10/21 0959   03/05/21 0709  vancomycin variable dose per unstable renal function (pharmacist dosing)         Does not apply See admin instructions 03/05/21 0709     03/05/21 0215  vancomycin (VANCOREADY) IVPB 500 mg/100 mL        500 mg 100 mL/hr over 60 Minutes Intravenous  Once 03/05/21 0202 03/05/21 0451   03/05/21 0145  azithromycin (ZITHROMAX) 500 mg in sodium chloride 0.9 % 250 mL IVPB  Status:  Discontinued        500 mg 250 mL/hr over 60 Minutes Intravenous Daily 03/05/21 0141 03/05/21 0958   03/04/21 2100  vancomycin (VANCOCIN) IVPB 1000 mg/200 mL premix        1,000 mg 200 mL/hr over 60 Minutes Intravenous  Once 03/04/21 2050 03/04/21 2213   03/04/21 2100  cefTAZidime (FORTAZ) injection 1 g        1 g Intramuscular  Once 03/04/21 2050 03/04/21 2254       REVIEW OF SYSTEMS:  Const: negative fever, negative chills, some  weight loss Eyes: negative diplopia or visual changes, negative eye pain ENT: negative coryza, negative sore throat Resp: ++ cough,yellow sputum -- hemoptysis, +dyspnea Cards: negative for chest pain, palpitations, lower extremity edema GU: does not make much urine GI: ++ abdominal pain, +diarrhea,  No bleeding, constipation Skin: negative for rash and pruritus Heme:  negative for easy bruising and gum/nose bleeding MS: weakness Neurolo:negative for headaches, dizziness, vertigo, memory problems  Psych: negative for feelings of anxiety, depression  Endocrine: negative for thyroid, diabetes Allergy/Immunology- negative for any medication or food allergies ? Objective:  VITALS:  BP 114/70    Pulse 84    Temp 98.1 F (36.7 C) (Oral)    Resp 16    Ht 5\' 3"  (1.6 m)    Wt 59 kg    SpO2 92%    BMI 23.03 kg/m  PHYSICAL EXAM:  General: Alert, cooperative, no distress, appears stated age. Thin , chronically ill Head: Normocephalic, without obvious abnormality, atraumatic. Eyes: Conjunctivae clear, anicteric sclerae. Pupils are equal ENT Nares normal. No drainage or sinus tenderness. Lips, mucosa, and tongue normal. No Thrush Neck: Supple, symmetrical, no adenopathy, thyroid: non tender no carotid bruit and no JVD. Back: No CVA tenderness. Lungs: b/l air entry Crepts bases Heart: Regular rate and rhythm, no murmur, rub or gallop. Abdomen: Soft, not distended. Tender at the site of the PD cath- scars Bowel sounds normal. No masses Extremities: atraumatic, no cyanosis. No edema. No clubbing Skin: No rashes or lesions. Or  bruising Lymph: Cervical, supraclavicular normal. Neurologic: Grossly non-focal Pertinent Labs Lab Results CBC    Component Value Date/Time   WBC 13.3 (H) 03/05/2021 0731   RBC 3.59 (L) 03/05/2021 0731   HGB 9.8 (L) 03/05/2021 0731   HGB 9.1 (L) 01/04/2020 1540   HCT 31.2 (L) 03/05/2021 0731   HCT 29.6 (L) 01/04/2020 1540   PLT 544 (H) 03/05/2021 0731   PLT 191 01/04/2020 1540   MCV 86.9 03/05/2021 0731   MCV 94 01/04/2020 1540   MCH 27.3 03/05/2021 0731   MCHC 31.4 03/05/2021 0731   RDW 14.7 03/05/2021 0731   RDW 16.2 (H) 01/04/2020 1540   LYMPHSABS 0.8 03/05/2021 0731   LYMPHSABS 1.0 01/04/2020 1540   MONOABS 1.0 03/05/2021 0731   EOSABS 0.2 03/05/2021 0731   EOSABS 0.5 (H) 03/27/2018 1643   BASOSABS 0.0 03/05/2021 0731    BASOSABS 0.0 03/27/2018 1643    CMP Latest Ref Rng & Units 03/05/2021 03/04/2021 01/20/2021  Glucose 70 - 99 mg/dL 97 96 87  BUN 8 - 23 mg/dL 75(H) 71(H) 65(H)  Creatinine 0.61 - 1.24 mg/dL 11.32(H) 11.07(H) 7.70(H)  Sodium 135 - 145 mmol/L 134(L) 135 142  Potassium 3.5 - 5.1 mmol/L 3.4(L) 3.2(L) 3.7  Chloride 98 - 111 mmol/L 97(L) 93(L) 107  CO2 22 - 32 mmol/L 20(L) 24 -  Calcium 8.9 - 10.3 mg/dL 5.4(LL) 5.9(LL) -  Total Protein 6.5 - 8.1 g/dL - 5.3(L) -  Total Bilirubin 0.3 - 1.2 mg/dL - 0.7 -  Alkaline Phos 38 - 126 U/L - 49 -  AST 15 - 41 U/L - 23 -  ALT 0 - 44 U/L - 24 -      Microbiology: Recent Results (from the past 240 hour(s))  Blood culture (routine x 2)     Status: None (Preliminary result)   Collection Time: 03/04/21  9:26 PM   Specimen: BLOOD  Result Value Ref Range Status   Specimen Description BLOOD LEFT FOREARM  Final   Special Requests   Final    BOTTLES DRAWN AEROBIC AND ANAEROBIC Blood Culture adequate volume   Culture   Final    NO GROWTH < 12 HOURS Performed at [facility], [address], [place], [place] [address]    Report Status PENDING  Incomplete  Resp Panel by RT-PCR (Flu A&B, Covid) Nasopharyngeal Swab     Status: None   Collection Time: 03/04/21  9:26 PM   Specimen: Nasopharyngeal Swab; Nasopharyngeal(NP) swabs in vial transport medium  Result Value Ref Range Status   SARS Coronavirus 2 by RT PCR NEGATIVE NEGATIVE Final    Comment: (NOTE) SARS-CoV-2 target nucleic acids are NOT DETECTED.  The SARS-CoV-2 RNA is generally detectable in upper respiratory specimens during the acute phase of infection. The lowest concentration of SARS-CoV-2 viral copies this assay can detect is 138 copies/mL. A negative result does not preclude SARS-Cov-2 infection and should not be used as the sole basis for treatment or other patient management decisions. A negative result may occur with  improper specimen collection/handling, submission of  specimen other than nasopharyngeal swab, presence of viral mutation(s) within the areas targeted by this assay, and inadequate number of viral copies(<138 copies/mL). A negative result must be combined with clinical observations, patient history, and epidemiological information. The expected result is Negative.  Fact Sheet for Patients:  EntrepreneurPulse.com.au  Fact Sheet for Healthcare Providers:  IncredibleEmployment.be  This test is no t yet approved or cleared by the [place] and  has been authorized for detection and/or diagnosis of SARS-CoV-2 by FDA under an Emergency Use Authorization (EUA). This EUA will remain  in effect (meaning this test can be used) for the duration of the COVID-19 declaration under Section 564(b)(1) of the Act, 21 U.S.C.section 360bbb-3(b)(1), unless the authorization is terminated  or revoked sooner.       Influenza A by PCR NEGATIVE NEGATIVE Final   Influenza B by PCR NEGATIVE NEGATIVE Final    Comment: (NOTE) The Xpert Xpress SARS-CoV-2/FLU/RSV plus assay is intended as an aid in the diagnosis of influenza from Nasopharyngeal swab specimens and should not be used as a sole basis for treatment. Nasal washings and aspirates are unacceptable for Xpert Xpress SARS-CoV-2/FLU/RSV testing.  Fact Sheet for Patients: EntrepreneurPulse.com.au  Fact Sheet for Healthcare Providers: IncredibleEmployment.be  This test is not yet approved or cleared by the [place] FDA and has been authorized for detection and/or diagnosis of SARS-CoV-2 by FDA under an Emergency Use Authorization (EUA). This EUA will remain in effect (meaning this test can be used) for the duration of the COVID-19 declaration under Section 564(b)(1) of the Act, 21 U.S.C. section 360bbb-3(b)(1), unless the authorization is terminated or revoked.  Performed at [facility], [address] [place], [place] [address]   Blood culture (routine x 2)     Status: None (Preliminary result)   Collection Time: 03/04/21  9:46 PM   Specimen: BLOOD  Result Value Ref Range Status   Specimen Description BLOOD RIGHT HAND  Final   Special Requests   Final    BOTTLES DRAWN AEROBIC AND ANAEROBIC Blood Culture results may not be optimal due to an inadequate volume of blood received in culture bottles   Culture   Final    NO GROWTH < 12 HOURS Performed at [facility], [address] [place], [place] [address]    Report Status PENDING  Incomplete    IMAGING RESULTS:  I have personally reviewed the films No infiltrate in the chest x-ray.  Impression/Recommendation  Peritonitis secondary to PD catheter- on 01/26/21 he had enterobacter cloacae in the fluid- not sure what antibiotic he got He needs to have repeat fluid cell count and culture Patient is currently on Vanco and ceftazidime and azithromycin. May Dc azithromycin tomorrow   Diarrhea- 2 weeks- r/o cdiff because of antibiotics  Anemia due to ESRD  Hypoalbuminemia  H/o Malfunctioning peritoneal dialysis catheter. Has undergone 4 surgeries in the past year  and twice it has been replaced.  The last surgery has been on 01/20/2021.  Repair of the anterior rectus sheath defect was fixed.  CAD status post CABG  Hypertension Recommend 2 d echo to assess EF  Hyperlipidemia ___________________________________________________ Discussed with patient,and his nurse Note:  This document was prepared using Dragon voice recognition software and may include unintentional dictation errors.

## 2021-03-05 NOTE — Progress Notes (Signed)
PHARMACIST - PHYSICIAN COMMUNICATION  CONCERNING: Antibiotic IV to Oral Route Change Policy  RECOMMENDATION: This patient is receiving azithromycin by the intravenous route.  Based on criteria approved by the Pharmacy and Therapeutics Committee, the antibiotic(s) is/are being converted to the equivalent oral dose form(s).   DESCRIPTION: These criteria include: Patient being treated for a respiratory tract infection, urinary tract infection, cellulitis or clostridium difficile associated diarrhea if on metronidazole The patient is not neutropenic and does not exhibit a GI malabsorption state The patient is eating (either orally or via tube) and/or has been taking other orally administered medications for a least 24 hours The patient is improving clinically and has a Tmax < 100.5  If you have questions about this conversion, please contact the [place]  03/05/21

## 2021-03-05 NOTE — Progress Notes (Signed)
PROGRESS NOTE    Anthony Hess  YYT:[id number] DOB: 02/24/1955 DOA: 03/04/2021 PCP: Pcp, No    Assessment & Plan:   Principal Problem:   Spontaneous bacterial peritonitis ([place]) Active Problems:   Cigarette smoker   History of bladder cancer   Hypertension   Bilateral hydronephrosis   Emphysema lung (HCC)   S/P CABG x 4   Atrial fibrillation (HCC)   ESRD (end stage renal disease) (HCC)   Anemia in ESRD (end-stage renal disease) (HCC)   Hyperlipidemia   Anemia in chronic kidney disease (CODE)   Leukocytosis   Abdominal pain: etiology unclear, possible SBP. Procal 2.67. Blood cxs NGTD. Continue on IV vanco. ID consulted. Nephro, Dr. Juleen China obtain peritoneal fluid prior to pt coming to ED.   ESRD: on PD. Dialysis as per nephro. 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 No need for a transfusion currently   Thrombocytosis: etiology unclear. Will continue to monitor   Hypocalcemia: Ca gluconate x 1. Management as per nephro   Hypomagnesemia: mg sulfate ordered.    Cough: CXR was neg for any acute findings. Tessalon prn. Encourage incentive spirometry    Diarrhea: w/ recent abx use. GI PCR panel and c. diff ordered   Hx of CAD: s/p CABG. Continue on aspirin, statin. Continue to hold metoprolol    HLD: continue on statin    HTN: continue to hold home dose of metoprolol, irbesartan secondary to low normal BP    Hx of bladder cancer: s/p transurethral resection. Management per onco outpatient     DVT prophylaxis: heparin  Code Status: full  Family Communication:  Disposition Plan: likely d/c back home  Level of care: Telemetry Cardiac  Status is: Inpatient  Remains inpatient appropriate because: severity of illness    Consultants:  ID Nephro  Procedures:   Antimicrobials: vanco    Subjective: Pt c/o abd pain   Objective: Vitals:   03/05/21 1300 03/05/21 1315 03/05/21 1330 03/05/21 1345  BP:   103/73   Pulse: 88 89 79 84  Resp:   19    Temp:      TempSrc:      SpO2: 94% 94% 95% 95%  Weight:      Height:        Intake/Output Summary (Last 24 hours) at 03/05/2021 1419 Last data filed at 03/05/2021 0451 Gross per 24 hour  Intake 512.44 ml  Output --  Net 512.44 ml   Filed Weights   03/04/21 1952  Weight: 59 kg    Examination:  General exam: Appears calm and comfortable  Respiratory system: Clear to auscultation. Respiratory effort normal. Cardiovascular system: S1 & S2 +. No rubs, gallops or clicks. No pedal edema. Gastrointestinal system: Abdomen is nondistended, soft and tenderness to palpation. Normal bowel sounds heard. Central nervous system: Alert and oriented. Moves all extremities  Psychiatry: Judgement and insight appear normal. Mood & affect appropriate.     Data Reviewed: I have personally reviewed following labs and imaging studies  CBC: Recent Labs  Lab 03/04/21 1958 03/05/21 0731  WBC 16.9* 13.3*  NEUTROABS  --  11.3*  HGB 11.1* 9.8*  HCT 35.7* 31.2*  MCV 86.4 86.9  PLT 617* 275*   Basic Metabolic Panel: Recent Labs  Lab 03/04/21 1958 03/04/21 2146 03/05/21 0731  NA 135  --  134*  K 3.2*  --  3.4*  CL 93*  --  97*  CO2 24  --  20*  GLUCOSE 96  --  97  BUN 71*  --  75*  CREATININE 11.07*  --  11.32*  CALCIUM 5.9*  --  5.4*  MG  --  1.7 1.5*  PHOS  --  10.7* 11.2*   GFR: Estimated Creatinine Clearance: 5.4 mL/min (A) (by C-G formula based on SCr of 11.32 mg/dL (H)). Liver Function Tests: Recent Labs  Lab 03/04/21 2146  AST 23  ALT 24  ALKPHOS 49  BILITOT 0.7  PROT 5.3*  ALBUMIN 1.8*   Recent Labs  Lab 03/04/21 2146  LIPASE 30   No results for input(s): AMMONIA in the last 168 hours. Coagulation Profile: Recent Labs  Lab 03/05/21 0731  INR 1.2   Cardiac Enzymes: No results for input(s): CKTOTAL, CKMB, CKMBINDEX, TROPONINI in the last 168 hours. BNP (last 3 results) No results for input(s): PROBNP in the last 8760 hours. HbA1C: No results for  input(s): HGBA1C in the last 72 hours. CBG: No results for input(s): GLUCAP in the last 168 hours. Lipid Profile: No results for input(s): CHOL, HDL, LDLCALC, TRIG, CHOLHDL, LDLDIRECT in the last 72 hours. Thyroid Function Tests: No results for input(s): TSH, T4TOTAL, FREET4, T3FREE, THYROIDAB in the last 72 hours. Anemia Panel: No results for input(s): VITAMINB12, FOLATE, FERRITIN, TIBC, IRON, RETICCTPCT in the last 72 hours. Sepsis Labs: Recent Labs  Lab 03/04/21 2126 03/04/21 2255  PROCALCITON  --  2.67  LATICACIDVEN 1.7 1.5    Recent Results (from the past 240 hour(s))  Blood culture (routine x 2)     Status: None (Preliminary result)   Collection Time: 03/04/21  9:26 PM   Specimen: BLOOD  Result Value Ref Range Status   Specimen Description BLOOD LEFT FOREARM  Final   Special Requests   Final    BOTTLES DRAWN AEROBIC AND ANAEROBIC Blood Culture adequate volume   Culture   Final    NO GROWTH < 12 HOURS Performed at [facility], [address] [place], [place] [address]    Report Status PENDING  Incomplete  Resp Panel by RT-PCR (Flu A&B, Covid) Nasopharyngeal Swab     Status: None   Collection Time: 03/04/21  9:26 PM   Specimen: Nasopharyngeal Swab; Nasopharyngeal(NP) swabs in vial transport medium  Result Value Ref Range Status   SARS Coronavirus 2 by RT PCR NEGATIVE NEGATIVE Final    Comment: (NOTE) SARS-CoV-2 target nucleic acids are NOT DETECTED.  The SARS-CoV-2 RNA is generally detectable in upper respiratory specimens during the acute phase of infection. The lowest concentration of SARS-CoV-2 viral copies this assay can detect is 138 copies/mL. A negative result does not preclude SARS-Cov-2 infection and should not be used as the sole basis for treatment or other patient management decisions. A negative result may occur with  improper specimen collection/handling, submission of specimen other than nasopharyngeal swab, presence of viral mutation(s)  within the areas targeted by this assay, and inadequate number of viral copies(<138 copies/mL). A negative result must be combined with clinical observations, patient history, and epidemiological information. The expected result is Negative.  Fact Sheet for Patients:  EntrepreneurPulse.com.au  Fact Sheet for Healthcare Providers:  IncredibleEmployment.be  This test is no t yet approved or cleared by the [place] FDA and  has been authorized for detection and/or diagnosis of SARS-CoV-2 by FDA under an Emergency Use Authorization (EUA). This EUA will remain  in effect (meaning this test can be used) for the duration of the COVID-19 declaration under Section 564(b)(1) of the Act, 21 U.S.C.section 360bbb-3(b)(1), unless the authorization is terminated  or revoked sooner.       Influenza A by PCR NEGATIVE NEGATIVE Final   Influenza B by PCR NEGATIVE NEGATIVE Final    Comment: (NOTE) The Xpert Xpress SARS-CoV-2/FLU/RSV plus assay is intended as an aid in the diagnosis of influenza from Nasopharyngeal swab specimens and should not be used as a sole basis for treatment. Nasal washings and aspirates are unacceptable for Xpert Xpress SARS-CoV-2/FLU/RSV testing.  Fact Sheet for Patients: EntrepreneurPulse.com.au  Fact Sheet for Healthcare Providers: IncredibleEmployment.be  This test is not yet approved or cleared by the [place] FDA and has been authorized for detection and/or diagnosis of SARS-CoV-2 by FDA under an Emergency Use Authorization (EUA). This EUA will remain in effect (meaning this test can be used) for the duration of the COVID-19 declaration under Section 564(b)(1) of the Act, 21 U.S.C. section 360bbb-3(b)(1), unless the authorization is terminated or revoked.  Performed at [facility], [address] [place], [place] [address]   Blood culture (routine x 2)     Status: None  (Preliminary result)   Collection Time: 03/04/21  9:46 PM   Specimen: BLOOD  Result Value Ref Range Status   Specimen Description BLOOD RIGHT HAND  Final   Special Requests   Final    BOTTLES DRAWN AEROBIC AND ANAEROBIC Blood Culture results may not be optimal due to an inadequate volume of blood received in culture bottles   Culture   Final    NO GROWTH < 12 HOURS Performed at [facility], [address], [place],  [address]    Report Status PENDING  Incomplete         Radiology Studies: DG Chest Portable 1 View  Result Date: 03/04/2021 CLINICAL DATA:  Shortness of breath.  Weakness. EXAM: PORTABLE CHEST 1 VIEW COMPARISON:  Radiograph 12/27/2019 FINDINGS: Post median sternotomy. Stable upper normal heart size. Unchanged mediastinal contours. No pulmonary edema, pleural effusion, or focal airspace disease. Scoliotic curvature of the spine. IMPRESSION: No acute chest findings. Electronically Signed   By: Keith Rake M.D.   On: 03/04/2021 20:50        Scheduled Meds:  aspirin EC  81 mg Oral Daily   atorvastatin  40 mg Oral QHS   [START ON 03/06/2021] azithromycin  500 mg Oral Daily   calcitRIOL  0.5 mcg Oral Daily   calcium acetate  1,334 mg Oral BID WC   gentamicin cream  1 application Topical Daily   heparin  5,000 Units Subcutaneous Q8H   vancomycin variable dose per unstable renal function (pharmacist dosing)   Does not apply See admin instructions   Continuous Infusions:  dialysis solution 1.5% low-MG/low-CA     sodium chloride       LOS: 1 day    Time spent: 33 mins     Wyvonnia Dusky, MD [facility] Pager [phone]  If 7PM-7AM, please contact night-coverage 03/05/2021, 2:19 PM

## 2021-03-05 NOTE — Care Management Important Message (Signed)
Important Message  Patient Details  Name: Anthony Hess MRN: [id number] Date of Birth: [birth year]   Medicare Important Message Given:  N/A - LOS <3 / Initial given by admissions     Dannette Barbara 03/05/2021, 7:22 PM

## 2021-03-05 NOTE — Progress Notes (Addendum)
[facility]  ROUNDING NOTE   Subjective:   Anthony Hess is a 66 y.o. male with past medical conditions including hypertension. Hyperlipidemia, and end stage renal disease on peritoneal dialysis. Patient presents to ED with complaints of abdominal pain. He has been admitted for Spontaneous bacterial peritonitis [facility]) [K65.2]   Patient is known to our practice and receives PD treatment and management by Dr Juleen China. Patient states the discomfort began a week ago. He has continued to perform his nightly treatments, but states the pain has gotten worse. He notified the PD clinic and was seen with cultures obtained from PD catheter. Denies fibrin in drainage bags. Reports cloudy PD fluid. States he's had diarrhea for the same time with nausea and vomiting. Denies shortness of breath. Endorses non productive cough.   Labs on arrival include potassium 3.2, BUN 71, and creatinine 11 with GFR 5. Chest xray negative for acute findings.   We have been consulted to manage dialysis needs during this admission   Objective:  Vital signs in last 24 hours:  Temp:  [98.1 F (36.7 C)] 98.1 F (36.7 C) (01/11 1953) Pulse Rate:  [79-102] 93 (01/12 1030) Resp:  [16-20] 17 (01/12 1030) BP: (79-116)/(57-83) 82/57 (01/12 1030) SpO2:  [90 %-100 %] 93 % (01/12 1030) Weight:  [59 kg] 59 kg (01/11 1952)  Weight change:  Filed Weights   03/04/21 1952  Weight: 59 kg    Intake/Output: I/O last 3 completed shifts: In: 512.4 [IV Piggyback:512.4] Out: -    Intake/Output this shift:  No intake/output data recorded.  Physical Exam: General: NAD, resting on stretcher  Head: Normocephalic, atraumatic. Moist oral mucosal membranes  Eyes: Anicteric  Lungs:  Clear to auscultation, normal effort  Heart: Regular rate and rhythm  Abdomen:  Soft, nontender  Extremities:  no peripheral edema.  Neurologic: Nonfocal, moving all four extremities  Skin: No lesions  Access: PD catheter    Basic  Metabolic Panel: Recent Labs  Lab 03/04/21 1958 03/04/21 2146 03/05/21 0731  NA 135  --  134*  K 3.2*  --  3.4*  CL 93*  --  97*  CO2 24  --  20*  GLUCOSE 96  --  97  BUN 71*  --  75*  CREATININE 11.07*  --  11.32*  CALCIUM 5.9*  --  5.4*  MG  --  1.7 1.5*  PHOS  --  10.7* 11.2*    Liver Function Tests: Recent Labs  Lab 03/04/21 2146  AST 23  ALT 24  ALKPHOS 49  BILITOT 0.7  PROT 5.3*  ALBUMIN 1.8*   Recent Labs  Lab 03/04/21 2146  LIPASE 30   No results for input(s): AMMONIA in the last 168 hours.  CBC: Recent Labs  Lab 03/04/21 1958 03/05/21 0731  WBC 16.9* 13.3*  NEUTROABS  --  11.3*  HGB 11.1* 9.8*  HCT 35.7* 31.2*  MCV 86.4 86.9  PLT 617* 544*    Cardiac Enzymes: No results for input(s): CKTOTAL, CKMB, CKMBINDEX, TROPONINI in the last 168 hours.  BNP: Invalid input(s): POCBNP  CBG: No results for input(s): GLUCAP in the last 168 hours.  Microbiology: Results for orders placed or performed during the hospital encounter of 03/04/21  Blood culture (routine x 2)     Status: None (Preliminary result)   Collection Time: 03/04/21  9:26 PM   Specimen: BLOOD  Result Value Ref Range Status   Specimen Description BLOOD LEFT FOREARM  Final   Special Requests   Final  BOTTLES DRAWN AEROBIC AND ANAEROBIC Blood Culture adequate volume   Culture   Final    NO GROWTH < 12 HOURS Performed at [facility], [address] [place], [place] [address]    Report Status PENDING  Incomplete  Resp Panel by RT-PCR (Flu A&B, Covid) Nasopharyngeal Swab     Status: None   Collection Time: 03/04/21  9:26 PM   Specimen: Nasopharyngeal Swab; Nasopharyngeal(NP) swabs in vial transport medium  Result Value Ref Range Status   SARS Coronavirus 2 by RT PCR NEGATIVE NEGATIVE Final    Comment: (NOTE) SARS-CoV-2 target nucleic acids are NOT DETECTED.  The SARS-CoV-2 RNA is generally detectable in upper respiratory specimens during the acute phase of infection.  The lowest concentration of SARS-CoV-2 viral copies this assay can detect is 138 copies/mL. A negative result does not preclude SARS-Cov-2 infection and should not be used as the sole basis for treatment or other patient management decisions. A negative result may occur with  improper specimen collection/handling, submission of specimen other than nasopharyngeal swab, presence of viral mutation(s) within the areas targeted by this assay, and inadequate number of viral copies(<138 copies/mL). A negative result must be combined with clinical observations, patient history, and epidemiological information. The expected result is Negative.  Fact Sheet for Patients:  EntrepreneurPulse.com.au  Fact Sheet for Healthcare Providers:  IncredibleEmployment.be  This test is no t yet approved or cleared by the [place] FDA and  has been authorized for detection and/or diagnosis of SARS-CoV-2 by FDA under an Emergency Use Authorization (EUA). This EUA will remain  in effect (meaning this test can be used) for the duration of the COVID-19 declaration under Section 564(b)(1) of the Act, 21 U.S.C.section 360bbb-3(b)(1), unless the authorization is terminated  or revoked sooner.       Influenza A by PCR NEGATIVE NEGATIVE Final   Influenza B by PCR NEGATIVE NEGATIVE Final    Comment: (NOTE) The Xpert Xpress SARS-CoV-2/FLU/RSV plus assay is intended as an aid in the diagnosis of influenza from Nasopharyngeal swab specimens and should not be used as a sole basis for treatment. Nasal washings and aspirates are unacceptable for Xpert Xpress SARS-CoV-2/FLU/RSV testing.  Fact Sheet for Patients: EntrepreneurPulse.com.au  Fact Sheet for Healthcare Providers: IncredibleEmployment.be  This test is not yet approved or cleared by the [place] FDA and has been authorized for detection and/or diagnosis of SARS-CoV-2 by FDA  under an Emergency Use Authorization (EUA). This EUA will remain in effect (meaning this test can be used) for the duration of the COVID-19 declaration under Section 564(b)(1) of the Act, 21 U.S.C. section 360bbb-3(b)(1), unless the authorization is terminated or revoked.  Performed at [facility], [address] [place], [place] [address]   Blood culture (routine x 2)     Status: None (Preliminary result)   Collection Time: 03/04/21  9:46 PM   Specimen: BLOOD  Result Value Ref Range Status   Specimen Description BLOOD RIGHT HAND  Final   Special Requests   Final    BOTTLES DRAWN AEROBIC AND ANAEROBIC Blood Culture results may not be optimal due to an inadequate volume of blood received in culture bottles   Culture   Final    NO GROWTH < 12 HOURS Performed at [facility], [address], [place], [place] [address]    Report Status PENDING  Incomplete    Coagulation Studies: Recent Labs    03/05/21 0731  LABPROT 15.6*  INR 1.2    Urinalysis: No  results for input(s): COLORURINE, LABSPEC, [place], GLUCOSEU, HGBUR, BILIRUBINUR, KETONESUR, PROTEINUR, UROBILINOGEN, NITRITE, LEUKOCYTESUR in the last 72 hours.  Invalid input(s): APPERANCEUR    Imaging: DG Chest Portable 1 View  Result Date: 03/04/2021 CLINICAL DATA:  Shortness of breath.  Weakness. EXAM: PORTABLE CHEST 1 VIEW COMPARISON:  Radiograph 12/27/2019 FINDINGS: Post median sternotomy. Stable upper normal heart size. Unchanged mediastinal contours. No pulmonary edema, pleural effusion, or focal airspace disease. Scoliotic curvature of the spine. IMPRESSION: No acute chest findings. Electronically Signed   By: Keith Rake M.D.   On: 03/04/2021 20:50     Medications:    sodium chloride      aspirin EC  81 mg Oral Daily   atorvastatin  40 mg Oral QHS   [START ON 03/06/2021] azithromycin  500 mg Oral Daily   calcitRIOL  0.5 mcg Oral Daily   calcium acetate  1,334 mg Oral BID WC   heparin   5,000 Units Subcutaneous Q8H   vancomycin variable dose per unstable renal function (pharmacist dosing)   Does not apply See admin instructions   acetaminophen **OR** acetaminophen, ipratropium-albuterol, morphine injection, ondansetron **OR** ondansetron (ZOFRAN) IV, oxyCODONE  Assessment/ Plan:  Mr. Daqwan Dougal is a 66 y.o.  male ith past medical conditions including hypertension. Hyperlipidemia, and end stage renal disease on peritoneal dialysis. Patient presents to ED with complaints of abdominal pain. He has been admitted for Spontaneous bacterial peritonitis ([place]) [K65.2]   [facility] [address]  End stage renal disease on peritoneal dialysis. Will maintain nightly schedule.   2. Anemia of chronic kidney disease Lab Results  Component Value Date   HGB 9.8 (L) 03/05/2021    Hgb at target. Will continue to monitor  3. Secondary Hyperparathyroidism:   Lab Results  Component Value Date   PTH 159 (H) 12/27/2019   CALCIUM 5.4 (LL) 03/05/2021   CAION 0.74 (LL) 01/20/2021   PHOS 11.2 (H) 03/05/2021    Calcium below target. Primary team has ordered Calcium gluconate 1G IV. Phosphorus elevated.  Continue daily calcitriol and calcium acetate with meals  4. Suspected peritonitis. Awaiting cultures drawn prior to admission. Empirically treated with Vancomycin and Ceftazidime. Remains on oral azithromycin. Primary team to consult ID   LOS: 1 [place] 1/12/202312:32 PM

## 2021-03-05 NOTE — Progress Notes (Addendum)
Pt have no bowel movement but has a stool order sample for c-diff. Accepting nurse made aware. Will continue to monitor.

## 2021-03-06 DIAGNOSIS — K659 Peritonitis, unspecified: Secondary | ICD-10-CM | POA: Diagnosis not present

## 2021-03-06 DIAGNOSIS — T8571XA Infection and inflammatory reaction due to peritoneal dialysis catheter, initial encounter: Secondary | ICD-10-CM | POA: Diagnosis not present

## 2021-03-06 LAB — CBC
HCT: 32.1 % — ABNORMAL LOW (ref 39.0–52.0)
Hemoglobin: 10 g/dL — ABNORMAL LOW (ref 13.0–17.0)
MCH: 27.4 pg (ref 26.0–34.0)
MCHC: 31.2 g/dL (ref 30.0–36.0)
MCV: 87.9 fL (ref 80.0–100.0)
Platelets: 503 10*3/uL — ABNORMAL HIGH (ref 150–400)
RBC: 3.65 MIL/uL — ABNORMAL LOW (ref 4.22–5.81)
RDW: 15 % (ref 11.5–15.5)
WBC: 13.6 10*3/uL — ABNORMAL HIGH (ref 4.0–10.5)
nRBC: 0 % (ref 0.0–0.2)

## 2021-03-06 LAB — ALBUMIN: Albumin: 1.5 g/dL — ABNORMAL LOW (ref 3.5–5.0)

## 2021-03-06 LAB — MAGNESIUM: Magnesium: 2.3 mg/dL (ref 1.7–2.4)

## 2021-03-06 LAB — BASIC METABOLIC PANEL
Anion gap: 19 — ABNORMAL HIGH (ref 5–15)
BUN: 95 mg/dL — ABNORMAL HIGH (ref 8–23)
CO2: 19 mmol/L — ABNORMAL LOW (ref 22–32)
Calcium: 5.9 mg/dL — CL (ref 8.9–10.3)
Chloride: 95 mmol/L — ABNORMAL LOW (ref 98–111)
Creatinine, Ser: 13.07 mg/dL — ABNORMAL HIGH (ref 0.61–1.24)
GFR, Estimated: 4 mL/min — ABNORMAL LOW (ref >60)
Glucose, Bld: 138 mg/dL — ABNORMAL HIGH (ref 70–99)
Potassium: 3.6 mmol/L (ref 3.5–5.1)
Sodium: 133 mmol/L — ABNORMAL LOW (ref 135–145)

## 2021-03-06 MED ORDER — CHLORHEXIDINE GLUCONATE CLOTH 2 % EX PADS
6.0000 | MEDICATED_PAD | Freq: Every day | CUTANEOUS | Status: DC
  Administered 2021-03-07 – 2021-03-08 (×2): 6 via TOPICAL

## 2021-03-06 MED ORDER — CALCIUM GLUCONATE-NACL 2-0.675 GM/100ML-% IV SOLN
2.0000 g | Freq: Once | INTRAVENOUS | Status: AC
  Administered 2021-03-06: 2000 mg via INTRAVENOUS
  Filled 2021-03-06: qty 100

## 2021-03-06 MED ORDER — SODIUM CHLORIDE 0.9 % IV SOLN
1.0000 g | INTRAVENOUS | Status: DC
  Administered 2021-03-06 – 2021-03-08 (×3): 1 g via INTRAVENOUS
  Filled 2021-03-06 (×4): qty 1

## 2021-03-06 MED ORDER — SODIUM CHLORIDE 0.9 % IV SOLN: INTRAVENOUS | Status: DC | PRN

## 2021-03-06 NOTE — Progress Notes (Signed)
Pharmacy Antibiotic Note  Anthony Hess is a 66 y.o. male admitted on 03/04/2021 with Intra-abdominal infection, SBP, end-stage renal disease patient on peritoneal dialysis.  Pharmacy has been consulted for Vancomycin dosing.   Height: 5\' 3"  (160 cm) Weight: 54 kg (119 lb 0.8 oz) IBW/kg (Calculated) : 56.9  Temp (24hrs), Avg:98.1 F (36.7 C), Min:97.8 F (36.6 C), Max:98.6 F (37 C)  Recent Labs  Lab 03/04/21 1958 03/04/21 2126 03/04/21 2255 03/05/21 0731 03/06/21 0533  WBC 16.9*  --   --  13.3* 13.6*  CREATININE 11.07*  --   --  11.32* 13.07*  LATICACIDVEN  --  1.7 1.5  --   --      Estimated Creatinine Clearance: 4.4 mL/min (A) (by C-G formula based on SCr of 13.07 mg/dL (H)).    No Known Allergies  Antimicrobials this admission: 1/12 Azithromycin >>  1/11 Vancomycin >> 1/11 Ceftazidime >> x1  Microbiology results: 1/11 BCx: Pending 1/11 GI Panel: Pending  1/11 C. Diff:  Needs to be collected  Plan: Pt was given initial load of Vancomycin 1.5 grams IV per pt wt: 59 kg on 1/11.   1/13: Vancomycin random level ordered on day 4 post-vancomycin load (03/08/21).  Thank you for allowing pharmacy to be a part of this patients care.  Darrick Penna, PharmD, MS PGPM Clinical Pharmacist 03/06/2021 12:08 PM,

## 2021-03-06 NOTE — Consult Note (Signed)
Pharmacy Antibiotic Note  Anthony Hess is a 66 y.o. male admitted on 03/04/2021 with Intra-abdominal infection, SBP, end-stage renal disease patient on peritoneal dialysis.  Pharmacy previously consulted for Vancomycin dosing. New consult for ceftazidime dosing received today 03/06/2021 from ID provider.  Height: 5\' 3"  (160 cm) Weight: 54 kg (119 lb 0.8 oz) IBW/kg (Calculated) : 56.9  Temp (24hrs), Avg:97.9 F (36.6 C), Min:97.7 F (36.5 C), Max:98.3 F (36.8 C)  Recent Labs  Lab 03/04/21 1958 03/04/21 2126 03/04/21 2255 03/05/21 0731 03/06/21 0533  WBC 16.9*  --   --  13.3* 13.6*  CREATININE 11.07*  --   --  11.32* 13.07*  LATICACIDVEN  --  1.7 1.5  --   --      Estimated Creatinine Clearance: 4.4 mL/min (A) (by C-G formula based on SCr of 13.07 mg/dL (H)).    No Known Allergies  Antimicrobials this admission: 1/12 Azithromycin >> 1/13 1/11 Vancomycin >> 1/11 Ceftazidime >> x1, resumed   Microbiology results: 1/11 BCx: Pending 1/11 GI Panel: Pending  1/11 C. Diff:  Needs to be collected  Plan: Ceftazidine IVPD 1gm q 24hr  Tiphanie Vo Rodriguez-Guzman PharmD, BCPS 03/06/2021 9:25 PM

## 2021-03-06 NOTE — Progress Notes (Signed)
°  Transition of Care [facility]) Screening Note   Patient Details  Name: Anthony Hess Date of Birth: 1955-09-09   Transition of Care [facility]) CM/SW Contact:    Alberteen Sam, LCSW Phone Number: 03/06/2021, 9:20 AM    Transition of Care Department [facility]) has reviewed patient and no TOC needs have been identified at this time. We will continue to monitor patient advancement through interdisciplinary progression rounds. If new patient transition needs arise, please place a TOC consult.  [place], [place]

## 2021-03-06 NOTE — Progress Notes (Signed)
Pt did not receive Peritoneal Dialysis overnight. This RN called the Dialysis unit multiple times but no one answered. This RN also called AC and inquired about dialysis nurse availability, AC was going to check if dialysis nurse is available but did not get a call back. Pt informed of the following events. Will continue to monitor pt.

## 2021-03-06 NOTE — Progress Notes (Signed)
[facility]  ROUNDING NOTE   Subjective:   Anthony Hess is a 66 y.o. male with past medical conditions including hypertension. Hyperlipidemia, and end stage renal disease on peritoneal dialysis. Patient presents to ED with complaints of abdominal pain. He has been admitted for Spontaneous bacterial peritonitis ([place]) [K65.2] SBP (spontaneous bacterial peritonitis) ([place]) [K65.2]   Patient is known to our practice and receives PD treatment and management by Dr Juleen China.  Continues to complain of discomfort in abdomen Tolerating meals without nausea and vomiting Denies shortness of breath No edema   Objective:  Vital signs in last 24 hours:  Temp:  [97.8 F (36.6 C)-98.6 F (37 C)] 98.3 F (36.8 C) (01/13 0905) Pulse Rate:  [49-95] 84 (01/13 0905) Resp:  [16-20] 18 (01/13 0905) BP: (103-146)/(70-103) 146/103 (01/13 0905) SpO2:  [92 %-99 %] 95 % (01/13 0905) Weight:  [53.4 kg-54 kg] 54 kg (01/13 0431)  Weight change: -5.579 kg Filed Weights   03/04/21 1952 03/05/21 2049 03/06/21 0431  Weight: 59 kg 53.4 kg 54 kg    Intake/Output: I/O last 3 completed shifts: In: 1112.4 [P.O.:600; IV Piggyback:512.4] Out: -    Intake/Output this shift:  Total I/O In: 120 [P.O.:120] Out: -   Physical Exam: General: NAD, resting in bed  Head: Normocephalic, atraumatic. Moist oral mucosal membranes  Eyes: Anicteric  Lungs:  Clear to auscultation, normal effort  Heart: Regular rate and rhythm  Abdomen:  Soft, nontender  Extremities:  no peripheral edema.  Neurologic: Nonfocal, moving all four extremities  Skin: No lesions  Access: PD catheter    Basic Metabolic Panel: Recent Labs  Lab 03/04/21 1958 03/04/21 2146 03/05/21 0731 03/06/21 0533  NA 135  --  134* 133*  K 3.2*  --  3.4* 3.6  CL 93*  --  97* 95*  CO2 24  --  20* 19*  GLUCOSE 96  --  97 138*  BUN 71*  --  75* 95*  CREATININE 11.07*  --  11.32* 13.07*  CALCIUM 5.9*  --  5.4* 5.9*  MG  --  1.7 1.5* 2.3   PHOS  --  10.7* 11.2*  --      Liver Function Tests: Recent Labs  Lab 03/04/21 2146 03/06/21 0533  AST 23  --   ALT 24  --   ALKPHOS 49  --   BILITOT 0.7  --   PROT 5.3*  --   ALBUMIN 1.8* 1.5*    Recent Labs  Lab 03/04/21 2146  LIPASE 30    No results for input(s): AMMONIA in the last 168 hours.  CBC: Recent Labs  Lab 03/04/21 1958 03/05/21 0731 03/06/21 0533  WBC 16.9* 13.3* 13.6*  NEUTROABS  --  11.3*  --   HGB 11.1* 9.8* 10.0*  HCT 35.7* 31.2* 32.1*  MCV 86.4 86.9 87.9  PLT 617* 544* 503*     Cardiac Enzymes: No results for input(s): CKTOTAL, CKMB, CKMBINDEX, TROPONINI in the last 168 hours.  BNP: Invalid input(s): POCBNP  CBG: No results for input(s): GLUCAP in the last 168 hours.  Microbiology: Results for orders placed or performed during the hospital encounter of 03/04/21  Blood culture (routine x 2)     Status: None (Preliminary result)   Collection Time: 03/04/21  9:26 PM   Specimen: BLOOD  Result Value Ref Range Status   Specimen Description BLOOD LEFT FOREARM  Final   Special Requests   Final    BOTTLES DRAWN AEROBIC AND ANAEROBIC Blood Culture adequate  volume   Culture   Final    NO GROWTH 2 DAYS Performed at [facility], [address] [place], [place] [address]    Report Status PENDING  Incomplete  Resp Panel by RT-PCR (Flu A&B, Covid) Nasopharyngeal Swab     Status: None   Collection Time: 03/04/21  9:26 PM   Specimen: Nasopharyngeal Swab; Nasopharyngeal(NP) swabs in vial transport medium  Result Value Ref Range Status   SARS Coronavirus 2 by RT PCR NEGATIVE NEGATIVE Final    Comment: (NOTE) SARS-CoV-2 target nucleic acids are NOT DETECTED.  The SARS-CoV-2 RNA is generally detectable in upper respiratory specimens during the acute phase of infection. The lowest concentration of SARS-CoV-2 viral copies this assay can detect is 138 copies/mL. A negative result does not preclude SARS-Cov-2 infection and should not  be used as the sole basis for treatment or other patient management decisions. A negative result may occur with  improper specimen collection/handling, submission of specimen other than nasopharyngeal swab, presence of viral mutation(s) within the areas targeted by this assay, and inadequate number of viral copies(<138 copies/mL). A negative result must be combined with clinical observations, patient history, and epidemiological information. The expected result is Negative.  Fact Sheet for Patients:  EntrepreneurPulse.com.au  Fact Sheet for Healthcare Providers:  IncredibleEmployment.be  This test is no t yet approved or cleared by the [place] FDA and  has been authorized for detection and/or diagnosis of SARS-CoV-2 by FDA under an Emergency Use Authorization (EUA). This EUA will remain  in effect (meaning this test can be used) for the duration of the COVID-19 declaration under Section 564(b)(1) of the Act, 21 U.S.C.section 360bbb-3(b)(1), unless the authorization is terminated  or revoked sooner.       Influenza A by PCR NEGATIVE NEGATIVE Final   Influenza B by PCR NEGATIVE NEGATIVE Final    Comment: (NOTE) The Xpert Xpress SARS-CoV-2/FLU/RSV plus assay is intended as an aid in the diagnosis of influenza from Nasopharyngeal swab specimens and should not be used as a sole basis for treatment. Nasal washings and aspirates are unacceptable for Xpert Xpress SARS-CoV-2/FLU/RSV testing.  Fact Sheet for Patients: EntrepreneurPulse.com.au  Fact Sheet for Healthcare Providers: IncredibleEmployment.be  This test is not yet approved or cleared by the [place] FDA and has been authorized for detection and/or diagnosis of SARS-CoV-2 by FDA under an Emergency Use Authorization (EUA). This EUA will remain in effect (meaning this test can be used) for the duration of the COVID-19 declaration under Section  564(b)(1) of the Act, 21 U.S.C. section 360bbb-3(b)(1), unless the authorization is terminated or revoked.  Performed at [facility], [address] [place], [place] [address]   Blood culture (routine x 2)     Status: None (Preliminary result)   Collection Time: 03/04/21  9:46 PM   Specimen: BLOOD  Result Value Ref Range Status   Specimen Description BLOOD RIGHT HAND  Final   Special Requests   Final    BOTTLES DRAWN AEROBIC AND ANAEROBIC Blood Culture results may not be optimal due to an inadequate volume of blood received in culture bottles   Culture   Final    NO GROWTH 2 DAYS Performed at [facility], [address] [place], [place] [address]    Report Status PENDING  Incomplete    Coagulation Studies: Recent Labs    03/05/21 0731  LABPROT 15.6*  INR 1.2     Urinalysis: No results for input(s): COLORURINE, LABSPEC, [place], GLUCOSEU, [place], BILIRUBINUR,  KETONESUR, PROTEINUR, UROBILINOGEN, NITRITE, LEUKOCYTESUR in the last 72 hours.  Invalid input(s): APPERANCEUR    Imaging: CT ABDOMEN PELVIS WO CONTRAST  Result Date: 03/05/2021 CLINICAL DATA:  Abdominal pain, nonlocalized. Peritoneal dialysis patient. Cloudy dialysate. EXAM: CT ABDOMEN AND PELVIS WITHOUT CONTRAST TECHNIQUE: Multidetector CT imaging of the abdomen and pelvis was performed following the standard protocol without IV contrast. RADIATION DOSE REDUCTION: This exam was performed according to the departmental dose-optimization program which includes automated exposure control, adjustment of the mA and/or kV according to patient size and/or use of iterative reconstruction technique. COMPARISON:  04/03/2018.  12/05/2020. FINDINGS: Lower chest: Lung bases are clear. Hepatobiliary: Chronic benign low densities within the liver probably representing cysts and of angiomas are unchanged. Question small stones or sludge in the gallbladder. Pancreas: No significant finding. Spleen: Normal  Adrenals/Urinary Tract: Adrenal glands are normal. Native kidneys show atrophic change and chronic hydroureteronephrosis right more than left. Bladder is unremarkable. Stomach/Bowel: Stomach and small intestine appear normal. No sign of bowel obstruction or inflammation. Colon is within normal limits. Moderate amount of stool and gas. Peritoneal dialysis catheter in place. Small amount of intraperitoneal fluid without evidence of loculated collection. Vascular/Lymphatic: Aortic atherosclerosis. Maximal diameter of the infrarenal abdominal aorta measures 3.2 cm. Reproductive: Normal Other: Small ventral hernias as seen previously contain less fluid. No sign of bowel extension or incarceration. Musculoskeletal: Scoliosis and degenerative change of the spine. Previous right femoral ORIF with hardware removal. IMPRESSION: Dialysis catheter remains in place. Small amount of intraperitoneal fluid without loculated collection. No specific complicating feature by CT. Stable appearance of chronic and presumably benign low densities within the liver. Question small amount of sludge or tiny stones dependent in the gallbladder. Aortic atherosclerosis. Maximal diameter of the aorta 3.2 cm. Recommend follow-up ultrasound every 3 years. This recommendation follows ACR consensus guidelines: White Paper of the ACR Incidental Findings Committee II on Vascular Findings. J Am Coll Radiol 2013; 10:789-794. Electronically Signed   By: Nelson Chimes M.D.   On: 03/05/2021 19:42   DG Chest Portable 1 View  Result Date: 03/04/2021 CLINICAL DATA:  Shortness of breath.  Weakness. EXAM: PORTABLE CHEST 1 VIEW COMPARISON:  Radiograph 12/27/2019 FINDINGS: Post median sternotomy. Stable upper normal heart size. Unchanged mediastinal contours. No pulmonary edema, pleural effusion, or focal airspace disease. Scoliotic curvature of the spine. IMPRESSION: No acute chest findings. Electronically Signed   By: Keith Rake M.D.   On: 03/04/2021  20:50     Medications:    calcium gluconate 2,000 mg (03/06/21 1129)   dialysis solution 1.5% low-MG/low-CA Stopped (03/05/21 1429)   sodium chloride      aspirin EC  81 mg Oral Daily   atorvastatin  40 mg Oral QHS   azithromycin  500 mg Oral Daily   calcitRIOL  0.5 mcg Oral Daily   calcium acetate  1,334 mg Oral BID WC   gentamicin cream  1 application Topical Daily   heparin  5,000 Units Subcutaneous Q8H   vancomycin variable dose per unstable renal function (pharmacist dosing)   Does not apply See admin instructions   acetaminophen **OR** acetaminophen, benzonatate, heparin, ipratropium-albuterol, morphine injection, ondansetron **OR** ondansetron (ZOFRAN) IV  Assessment/ Plan:  [name] is a 66 y.o.  male ith past medical conditions including hypertension. Hyperlipidemia, and end stage renal disease on peritoneal dialysis. Patient presents to ED with complaints of abdominal pain. He has been admitted for Spontaneous bacterial peritonitis ([place]) [K65.2] SBP (spontaneous bacterial peritonitis) ([place]) [K65.2]   [facility]  [address]  End stage renal disease on peritoneal dialysis. Will maintain nightly schedule.  Dialysis not received overnight.  Will provide treatment during daytime today.  2. Anemia of chronic kidney disease Lab Results  Component Value Date   HGB 10.0 (L) 03/06/2021    Hgb remains within acceptable goal  3. Secondary Hyperparathyroidism:   Lab Results  Component Value Date   PTH 159 (H) 12/27/2019   CALCIUM 5.9 (LL) 03/06/2021   CAION 0.74 (LL) 01/20/2021   PHOS 11.2 (H) 03/05/2021    Calcium remains low but improved.. Calcium gluconate 2G IV.  Continue daily calcitriol and calcium acetate with meals  4. Suspected peritonitis. Awaiting cultures drawn prior to admission. Empirically treated with Vancomycin and Ceftazidime in ED.  Currently prescribed oral azithromycin.  ID consulted and following   LOS: 2 [place] 1/13/202311:31  AM

## 2021-03-06 NOTE — Progress Notes (Signed)
Date of Admission:  03/04/2021    ID: Anthony Hess is a 66 y.o. male  Principal Problem:   Spontaneous bacterial peritonitis ([place]) Active Problems:   Cigarette smoker   History of bladder cancer   Hypertension   Bilateral hydronephrosis   Emphysema lung (H[facility])   S/P CABG x 4   Atrial fibrillation ([facility])   ESRD (end stage renal disease) ([place])   Anemia in ESRD (end-stage renal disease) ([facility])   Hyperlipidemia   Anemia in chronic kidney disease (CODE)   Leukocytosis    Subjective: He has no diarrhea since 24 hrs Has some abdominal pain cough present Getting peritoneal dialysis  Medications:   aspirin EC  81 mg Oral Daily   atorvastatin  40 mg Oral QHS   azithromycin  500 mg Oral Daily   calcitRIOL  0.5 mcg Oral Daily   calcium acetate  1,334 mg Oral BID WC   gentamicin cream  1 application Topical Daily   heparin  5,000 Units Subcutaneous Q8H   vancomycin variable dose per unstable renal function (pharmacist dosing)   Does not apply See admin instructions    Objective: Vital signs in last 24 hours: Patient Vitals for the past 24 hrs:  BP Temp Temp src Pulse Resp SpO2 Weight  03/06/21 1233 (!) 164/98 97.8 F (36.6 C) -- 78 18 93 % --  03/06/21 1132 123/88 98 F (36.7 C) Oral 77 -- 95 % --  03/06/21 0905 (!) 146/103 98.3 F (36.8 C) Oral 84 18 95 % --  03/06/21 0431 (!) 134/98 98 F (36.7 C) Oral 92 16 92 % 54 kg  03/05/21 2049 136/87 97.8 F (36.6 C) -- (!) 49 18 92 % 53.4 kg  03/05/21 2009 124/88 98.6 F (37 C) Oral 78 20 95 % --  03/05/21 1630 114/70 -- -- 84 16 92 % --       PHYSICAL EXAM:  General: Alert, cooperative, chronically ill, pale Lungs: b/l air entry Heart: s1s2 Abdomen: slighlty distended- PD catheter site tender  Extremities: atraumatic, no cyanosis. No edema. No clubbing Skin: No rashes or lesions. Or bruising Lymph: Cervical, supraclavicular normal. Neurologic: Grossly non-focal CBC Latest Ref Rng & Units 03/06/2021 03/05/2021 03/04/2021   WBC 4.0 - 10.5 K/uL 13.6(H) 13.3(H) 16.9(H)  Hemoglobin 13.0 - 17.0 g/dL 10.0(L) 9.8(L) 11.1(L)  Hematocrit 39.0 - 52.0 % 32.1(L) 31.2(L) 35.7(L)  Platelets 150 - 400 K/uL 503(H) 544(H) 617(H)    CMP Latest Ref Rng & Units 03/06/2021 03/05/2021 03/04/2021  Glucose 70 - 99 mg/dL 138(H) 97 96  BUN 8 - 23 mg/dL 95(H) 75(H) 71(H)  Creatinine 0.61 - 1.24 mg/dL 13.07(H) 11.32(H) 11.07(H)  Sodium 135 - 145 mmol/L 133(L) 134(L) 135  Potassium 3.5 - 5.1 mmol/L 3.6 3.4(L) 3.2(L)  Chloride 98 - 111 mmol/L 95(L) 97(L) 93(L)  CO2 22 - 32 mmol/L 19(L) 20(L) 24  Calcium 8.9 - 10.3 mg/dL 5.9(LL) 5.4(LL) 5.9(LL)  Total Protein 6.5 - 8.1 g/dL - - 5.3(L)  Total Bilirubin 0.3 - 1.2 mg/dL - - 0.7  Alkaline Phos 38 - 126 U/L - - 49  AST 15 - 41 U/L - - 23  ALT 0 - 44 U/L - - 24     Sedimentation Rate No results for input(s): ESRSEDRATE in the last 72 hours. C-Reactive Protein No results for input(s): CRP in the last 72 hours.  Microbiology:  Studies/Results: CT ABDOMEN PELVIS WO CONTRAST  Result Date: 03/05/2021 CLINICAL DATA:  Abdominal pain, nonlocalized. Peritoneal dialysis patient. Cloudy  dialysate. EXAM: CT ABDOMEN AND PELVIS WITHOUT CONTRAST TECHNIQUE: Multidetector CT imaging of the abdomen and pelvis was performed following the standard protocol without IV contrast. RADIATION DOSE REDUCTION: This exam was performed according to the departmental dose-optimization program which includes automated exposure control, adjustment of the mA and/or kV according to patient size and/or use of iterative reconstruction technique. COMPARISON:  04/03/2018.  12/05/2020. FINDINGS: Lower chest: Lung bases are clear. Hepatobiliary: Chronic benign low densities within the liver probably representing cysts and of angiomas are unchanged. Question small stones or sludge in the gallbladder. Pancreas: No significant finding. Spleen: Normal Adrenals/Urinary Tract: Adrenal glands are normal. Native kidneys show atrophic change  and chronic hydroureteronephrosis right more than left. Bladder is unremarkable. Stomach/Bowel: Stomach and small intestine appear normal. No sign of bowel obstruction or inflammation. Colon is within normal limits. Moderate amount of stool and gas. Peritoneal dialysis catheter in place. Small amount of intraperitoneal fluid without evidence of loculated collection. Vascular/Lymphatic: Aortic atherosclerosis. Maximal diameter of the infrarenal abdominal aorta measures 3.2 cm. Reproductive: Normal Other: Small ventral hernias as seen previously contain less fluid. No sign of bowel extension or incarceration. Musculoskeletal: Scoliosis and degenerative change of the spine. Previous right femoral ORIF with hardware removal. IMPRESSION: Dialysis catheter remains in place. Small amount of intraperitoneal fluid without loculated collection. No specific complicating feature by CT. Stable appearance of chronic and presumably benign low densities within the liver. Question small amount of sludge or tiny stones dependent in the gallbladder. Aortic atherosclerosis. Maximal diameter of the aorta 3.2 cm. Recommend follow-up ultrasound every 3 years. This recommendation follows ACR consensus guidelines: White Paper of the ACR Incidental Findings Committee II on Vascular Findings. J Am Coll Radiol 2013; 10:789-794. Electronically Signed   By: Nelson Chimes M.D.   On: 03/05/2021 19:42   DG Chest Portable 1 View  Result Date: 03/04/2021 CLINICAL DATA:  Shortness of breath.  Weakness. EXAM: PORTABLE CHEST 1 VIEW COMPARISON:  Radiograph 12/27/2019 FINDINGS: Post median sternotomy. Stable upper normal heart size. Unchanged mediastinal contours. No pulmonary edema, pleural effusion, or focal airspace disease. Scoliotic curvature of the spine. IMPRESSION: No acute chest findings. Electronically Signed   By: Keith Rake M.D.   On: 03/04/2021 20:50     Assessment/Plan: Peritonitis secondary to PD catheter suspected - on 01/26/21  he had enterobacter cloacae in the fluid- and apparently got vanco/cefepime with PD as per nephrology  repeat fluid cell count and culture sent from their office- still pending Patient is currently on Vanco and ceftazidime( apparently after the first dose it was not continued) and azithromycin. DC azithro     Diarrhea- 2 weeks- r/o cdiff because of antibiotics- since 24 hrs  no diarrhea- so doubt this is cdiff   Anemia due to ESRD   Hypoalbuminemia   H/o Malfunctioning peritoneal dialysis catheter. Has undergone 4 surgeries in the past year  and twice it has been replaced.  The last surgery has been on 01/20/2021.  Repair of the anterior rectus sheath defect was fixed.   CAD status post CABG  Hypertension  Discussed the management with care team Because this is peritonitis due to dialysis cath I will let Nephrology manage the case and I will sign off- call if needed

## 2021-03-06 NOTE — Progress Notes (Signed)
PROGRESS NOTE    Anthony Hess  LGX:[id number] DOB: 1955-07-16 DOA: 03/04/2021 PCP: Pcp, No    Assessment & Plan:   Principal Problem:   Spontaneous bacterial peritonitis ([place]) Active Problems:   Cigarette smoker   History of bladder cancer   Hypertension   Bilateral hydronephrosis   Emphysema lung (HCC)   S/P CABG x 4   Atrial fibrillation (HCC)   ESRD (end stage renal disease) (HCC)   Anemia in ESRD (end-stage renal disease) (HCC)   Hyperlipidemia   Anemia in chronic kidney disease (CODE)   Leukocytosis   Abdominal pain: etiology unclear, possible SBP. Procal 2.67. Blood cxs NGTD. Continue on IV vanco as per ID. Peritoneal fluid cx is pending as per nephro (cx was obtained as an outpatient)  ESRD: on PD. Dialysis as per nephro. 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 H&H are labile   Thrombocytosis: etiology unclear. Will continue to monitor   Hypocalcemia: Ca gluconate given again today.   Hypomagnesemia: WNL today   Hyponatremia: will be managed w/ PD   Cough: CXR was neg for any acute findings. Tessalon prn. Encourage incentive spirometry    Diarrhea: w/ recent abx use. C. diff & GI PCR panel need to be collected today, if not will d/c both tests    Hx of CAD: s/p CABG. Continue on aspirin, statin. Continue to hold metoprolol, irbesartan   HLD: continue on statin    HTN: continue to hold home dose of irbesartan, metoprolol secondary to low normal BP   Hx of bladder cancer: s/p transurethral resection. Management per onco outpatient     DVT prophylaxis: heparin  Code Status: full  Family Communication:  Disposition Plan: likely d/c back home  Level of care: Telemetry Cardiac  Status is: Inpatient  Remains inpatient appropriate because: severity of illness    Consultants:  ID Nephro  Procedures:   Antimicrobials: vanco    Subjective: Pt c/o malaise   Objective: Vitals:   03/05/21 1630 03/05/21 2009 03/05/21 2049 03/06/21  0431  BP: 114/70 124/88 136/87 (!) 134/98  Pulse: 84 78 (!) 49 92  Resp: 16 20 18 16   Temp:  98.6 F (37 C) 97.8 F (36.6 C) 98 F (36.7 C)  TempSrc:  Oral  Oral  SpO2: 92% 95% 92% 92%  Weight:   53.4 kg 54 kg  Height:        Intake/Output Summary (Last 24 hours) at 03/06/2021 0747 Last data filed at 03/06/2021 0600 Gross per 24 hour  Intake 600 ml  Output --  Net 600 ml   Filed Weights   03/04/21 1952 03/05/21 2049 03/06/21 0431  Weight: 59 kg 53.4 kg 54 kg    Examination:  General exam: Appears comfortable   Respiratory system: clear breath sounds b/l  Cardiovascular system: S1/S2+. No rubs or clicks  Gastrointestinal system: Abd is soft, NT, ND & hypoactive bowel sounds Central nervous system: alert and oriented. Moves all extremities  Psychiatry: Judgement and insight appears normal. Flat mood and affect     Data Reviewed: I have personally reviewed following labs and imaging studies  CBC: Recent Labs  Lab 03/04/21 1958 03/05/21 0731 03/06/21 0533  WBC 16.9* 13.3* 13.6*  NEUTROABS  --  11.3*  --   HGB 11.1* 9.8* 10.0*  HCT 35.7* 31.2* 32.1*  MCV 86.4 86.9 87.9  PLT 617* 544* 814*   Basic Metabolic Panel: Recent Labs  Lab 03/04/21 1958 03/04/21 2146 03/05/21 0731 03/06/21 0533  NA  135  --  134* 133*  K 3.2*  --  3.4* 3.6  CL 93*  --  97* 95*  CO2 24  --  20* 19*  GLUCOSE 96  --  97 138*  BUN 71*  --  75* 95*  CREATININE 11.07*  --  11.32* 13.07*  CALCIUM 5.9*  --  5.4* 5.9*  MG  --  1.7 1.5* 2.3  PHOS  --  10.7* 11.2*  --    GFR: Estimated Creatinine Clearance: 4.4 mL/min (A) (by C-G formula based on SCr of 13.07 mg/dL (H)). Liver Function Tests: Recent Labs  Lab 03/04/21 2146 03/06/21 0533  AST 23  --   ALT 24  --   ALKPHOS 49  --   BILITOT 0.7  --   PROT 5.3*  --   ALBUMIN 1.8* 1.5*   Recent Labs  Lab 03/04/21 2146  LIPASE 30   No results for input(s): AMMONIA in the last 168 hours. Coagulation Profile: Recent Labs  Lab  03/05/21 0731  INR 1.2   Cardiac Enzymes: No results for input(s): CKTOTAL, CKMB, CKMBINDEX, TROPONINI in the last 168 hours. BNP (last 3 results) No results for input(s): PROBNP in the last 8760 hours. HbA1C: No results for input(s): HGBA1C in the last 72 hours. CBG: No results for input(s): GLUCAP in the last 168 hours. Lipid Profile: No results for input(s): CHOL, HDL, LDLCALC, TRIG, CHOLHDL, LDLDIRECT in the last 72 hours. Thyroid Function Tests: No results for input(s): TSH, T4TOTAL, FREET4, T3FREE, THYROIDAB in the last 72 hours. Anemia Panel: No results for input(s): VITAMINB12, FOLATE, FERRITIN, TIBC, IRON, RETICCTPCT in the last 72 hours. Sepsis Labs: Recent Labs  Lab 03/04/21 2126 03/04/21 2255  PROCALCITON  --  2.67  LATICACIDVEN 1.7 1.5    Recent Results (from the past 240 hour(s))  Blood culture (routine x 2)     Status: None (Preliminary result)   Collection Time: 03/04/21  9:26 PM   Specimen: BLOOD  Result Value Ref Range Status   Specimen Description BLOOD LEFT FOREARM  Final   Special Requests   Final    BOTTLES DRAWN AEROBIC AND ANAEROBIC Blood Culture adequate volume   Culture   Final    NO GROWTH 2 DAYS Performed at [facility], [address] [place], [place] [address]    Report Status PENDING  Incomplete  Resp Panel by RT-PCR (Flu A&B, Covid) Nasopharyngeal Swab     Status: None   Collection Time: 03/04/21  9:26 PM   Specimen: Nasopharyngeal Swab; Nasopharyngeal(NP) swabs in vial transport medium  Result Value Ref Range Status   SARS Coronavirus 2 by RT PCR NEGATIVE NEGATIVE Final    Comment: (NOTE) SARS-CoV-2 target nucleic acids are NOT DETECTED.  The SARS-CoV-2 RNA is generally detectable in upper respiratory specimens during the acute phase of infection. The lowest concentration of SARS-CoV-2 viral copies this assay can detect is 138 copies/mL. A negative result does not preclude SARS-Cov-2 infection and should not be used as  the sole basis for treatment or other patient management decisions. A negative result may occur with  improper specimen collection/handling, submission of specimen other than nasopharyngeal swab, presence of viral mutation(s) within the areas targeted by this assay, and inadequate number of viral copies(<138 copies/mL). A negative result must be combined with clinical observations, patient history, and epidemiological information. The expected result is Negative.  Fact Sheet for Patients:  EntrepreneurPulse.com.au  Fact Sheet for Healthcare Providers:  IncredibleEmployment.be  This test is no  t yet approved or cleared by the [place] and  has been authorized for detection and/or diagnosis of SARS-CoV-2 by FDA under an Emergency Use Authorization (EUA). This EUA will remain  in effect (meaning this test can be used) for the duration of the COVID-19 declaration under Section 564(b)(1) of the Act, 21 U.S.C.section 360bbb-3(b)(1), unless the authorization is terminated  or revoked sooner.       Influenza A by PCR NEGATIVE NEGATIVE Final   Influenza B by PCR NEGATIVE NEGATIVE Final    Comment: (NOTE) The Xpert Xpress SARS-CoV-2/FLU/RSV plus assay is intended as an aid in the diagnosis of influenza from Nasopharyngeal swab specimens and should not be used as a sole basis for treatment. Nasal washings and aspirates are unacceptable for Xpert Xpress SARS-CoV-2/FLU/RSV testing.  Fact Sheet for Patients: EntrepreneurPulse.com.au  Fact Sheet for Healthcare Providers: IncredibleEmployment.be  This test is not yet approved or cleared by the [place] FDA and has been authorized for detection and/or diagnosis of SARS-CoV-2 by FDA under an Emergency Use Authorization (EUA). This EUA will remain in effect (meaning this test can be used) for the duration of the COVID-19 declaration under Section 564(b)(1) of  the Act, 21 U.S.C. section 360bbb-3(b)(1), unless the authorization is terminated or revoked.  Performed at [facility], [address] [place], [place] [address]   Blood culture (routine x 2)     Status: None (Preliminary result)   Collection Time: 03/04/21  9:46 PM   Specimen: BLOOD  Result Value Ref Range Status   Specimen Description BLOOD RIGHT HAND  Final   Special Requests   Final    BOTTLES DRAWN AEROBIC AND ANAEROBIC Blood Culture results may not be optimal due to an inadequate volume of blood received in culture bottles   Culture   Final    NO GROWTH 2 DAYS Performed at [facility], [address], [place], [place] [address]    Report Status PENDING  Incomplete         Radiology Studies: CT ABDOMEN PELVIS WO CONTRAST  Result Date: 03/05/2021 CLINICAL DATA:  Abdominal pain, nonlocalized. Peritoneal dialysis patient. Cloudy dialysate. EXAM: CT ABDOMEN AND PELVIS WITHOUT CONTRAST TECHNIQUE: Multidetector CT imaging of the abdomen and pelvis was performed following the standard protocol without IV contrast. RADIATION DOSE REDUCTION: This exam was performed according to the departmental dose-optimization program which includes automated exposure control, adjustment of the mA and/or kV according to patient size and/or use of iterative reconstruction technique. COMPARISON:  04/03/2018.  12/05/2020. FINDINGS: Lower chest: Lung bases are clear. Hepatobiliary: Chronic benign low densities within the liver probably representing cysts and of angiomas are unchanged. Question small stones or sludge in the gallbladder. Pancreas: No significant finding. Spleen: Normal Adrenals/Urinary Tract: Adrenal glands are normal. Native kidneys show atrophic change and chronic hydroureteronephrosis right more than left. Bladder is unremarkable. Stomach/Bowel: Stomach and small intestine appear normal. No sign of bowel obstruction or inflammation. Colon is within normal limits.  Moderate amount of stool and gas. Peritoneal dialysis catheter in place. Small amount of intraperitoneal fluid without evidence of loculated collection. Vascular/Lymphatic: Aortic atherosclerosis. Maximal diameter of the infrarenal abdominal aorta measures 3.2 cm. Reproductive: Normal Other: Small ventral hernias as seen previously contain less fluid. No sign of bowel extension or incarceration. Musculoskeletal: Scoliosis and degenerative change of the spine. Previous right femoral ORIF with hardware removal. IMPRESSION: Dialysis catheter remains in place. Small amount of intraperitoneal fluid without loculated collection. No specific complicating feature by CT. Stable  appearance of chronic and presumably benign low densities within the liver. Question small amount of sludge or tiny stones dependent in the gallbladder. Aortic atherosclerosis. Maximal diameter of the aorta 3.2 cm. Recommend follow-up ultrasound every 3 years. This recommendation follows ACR consensus guidelines: White Paper of the ACR Incidental Findings Committee II on Vascular Findings. J Am Coll Radiol 2013; 10:789-794. Electronically Signed   By: Nelson Chimes M.D.   On: 03/05/2021 19:42   DG Chest Portable 1 View  Result Date: 03/04/2021 CLINICAL DATA:  Shortness of breath.  Weakness. EXAM: PORTABLE CHEST 1 VIEW COMPARISON:  Radiograph 12/27/2019 FINDINGS: Post median sternotomy. Stable upper normal heart size. Unchanged mediastinal contours. No pulmonary edema, pleural effusion, or focal airspace disease. Scoliotic curvature of the spine. IMPRESSION: No acute chest findings. Electronically Signed   By: Keith Rake M.D.   On: 03/04/2021 20:50        Scheduled Meds:  aspirin EC  81 mg Oral Daily   atorvastatin  40 mg Oral QHS   azithromycin  500 mg Oral Daily   calcitRIOL  0.5 mcg Oral Daily   calcium acetate  1,334 mg Oral BID WC   gentamicin cream  1 application Topical Daily   heparin  5,000 Units Subcutaneous Q8H    vancomycin variable dose per unstable renal function (pharmacist dosing)   Does not apply See admin instructions   Continuous Infusions:  calcium gluconate     dialysis solution 1.5% low-MG/low-CA Stopped (03/05/21 1429)   sodium chloride       LOS: 2 days    Time spent: 25 mins     Wyvonnia Dusky, MD [facility] Pager [phone]  If 7PM-7AM, please contact night-coverage 03/06/2021, 7:47 AM

## 2021-03-06 NOTE — Progress Notes (Signed)
Patient completes 8-hour CCPD treatment, without incident, received one call from unit citing machine alarming, patient states he got up to use bathroom and was lying on the lines. Repositioned patient and restarted machine.   PD fluid cloudy with large amounts of fibrin seen, patient states he uses heparin at home. Catheter dressing changed, no redness nor drainage noted, antibiotic ointment applied. Patient offers no concerns, remains in bed, bed in low position, call light within reach.

## 2021-03-07 LAB — BASIC METABOLIC PANEL
Anion gap: 17 — ABNORMAL HIGH (ref 5–15)
BUN: 90 mg/dL — ABNORMAL HIGH (ref 8–23)
CO2: 20 mmol/L — ABNORMAL LOW (ref 22–32)
Calcium: 7 mg/dL — ABNORMAL LOW (ref 8.9–10.3)
Chloride: 99 mmol/L (ref 98–111)
Creatinine, Ser: 11.15 mg/dL — ABNORMAL HIGH (ref 0.61–1.24)
GFR, Estimated: 5 mL/min — ABNORMAL LOW (ref >60)
Glucose, Bld: 117 mg/dL — ABNORMAL HIGH (ref 70–99)
Potassium: 3.5 mmol/L (ref 3.5–5.1)
Sodium: 136 mmol/L (ref 135–145)

## 2021-03-07 LAB — CBC
HCT: 35.3 % — ABNORMAL LOW (ref 39.0–52.0)
Hemoglobin: 11.1 g/dL — ABNORMAL LOW (ref 13.0–17.0)
MCH: 26.8 pg (ref 26.0–34.0)
MCHC: 31.4 g/dL (ref 30.0–36.0)
MCV: 85.3 fL (ref 80.0–100.0)
Platelets: 567 10*3/uL — ABNORMAL HIGH (ref 150–400)
RBC: 4.14 MIL/uL — ABNORMAL LOW (ref 4.22–5.81)
RDW: 14.8 % (ref 11.5–15.5)
WBC: 17.2 10*3/uL — ABNORMAL HIGH (ref 4.0–10.5)
nRBC: 0 % (ref 0.0–0.2)

## 2021-03-07 LAB — MAGNESIUM: Magnesium: 2.1 mg/dL (ref 1.7–2.4)

## 2021-03-07 MED ORDER — CALCIUM ACETATE (PHOS BINDER) 667 MG PO CAPS
2001.0000 mg | ORAL_CAPSULE | Freq: Two times a day (BID) | ORAL | Status: DC
  Administered 2021-03-07 – 2021-03-09 (×4): 2001 mg via ORAL
  Filled 2021-03-07 (×6): qty 3

## 2021-03-07 MED ORDER — METOPROLOL SUCCINATE ER 50 MG PO TB24
50.0000 mg | ORAL_TABLET | Freq: Every day | ORAL | Status: DC
  Administered 2021-03-07 – 2021-03-09 (×3): 50 mg via ORAL
  Filled 2021-03-07 (×3): qty 1

## 2021-03-07 NOTE — Progress Notes (Signed)
PROGRESS NOTE    Anthony Hess  UVO:[id number] DOB: 08-Jun-1955 DOA: 03/04/2021 PCP: Pcp, No    Assessment & Plan:   Principal Problem:   Spontaneous bacterial peritonitis ([place]) Active Problems:   Cigarette smoker   History of bladder cancer   Hypertension   Bilateral hydronephrosis   Emphysema lung (HCC)   S/P CABG x 4   Atrial fibrillation (HCC)   ESRD (end stage renal disease) (HCC)   Anemia in ESRD (end-stage renal disease) (HCC)   Hyperlipidemia   Anemia in chronic kidney disease (CODE)   Leukocytosis   Abdominal pain: etiology unclear, possible SBP. Procal 2.67. Blood cxs NGTD. Continue on IV vanco, ceftazidime as per nephro. Peritoneal fluid cx pending as per nephro (cx was obtained as an outpatient)   ESRD: on PD. Dialysis as per nephro   ACD: likely secondary to ESRD. H&H are stable   Thrombocytosis: etiology unclear, labile   Hypocalcemia: improved, management as per nephro   Hypomagnesemia: WNL today  Hyponatremia: resolved    Cough: CXR was neg for any acute findings. Tessalon pearls prn. Encourage incentive spirometry    Diarrhea: w/ recent abx use. Resolved    Hx of CAD: s/p CABG. Continue on aspirin, statin and will restart home dose of metoprolol   HLD: continue on statin    HTN:  restart home dose of metoprolol. Continue to hold irbesartan  Hx of bladder cancer: s/p transurethral resection. Management per onco outpatient     DVT prophylaxis: heparin  Code Status: full  Family Communication:  Disposition Plan: likely d/c back home  Level of care: Telemetry Cardiac  Status is: Inpatient  Remains inpatient appropriate because: severity of illness    Consultants:  ID Nephro  Procedures:   Antimicrobials: vanco, ceftazidime    Subjective: Pt c/o fatigue   Objective: Vitals:   03/06/21 2036 03/07/21 0011 03/07/21 0453 03/07/21 0500  BP: (!) 157/112 (!) 133/107 (!) 139/109   Pulse: 95 97 (!) 104   Resp: 17 16 17    Temp: 97.7  F (36.5 C) 98.5 F (36.9 C) 98.2 F (36.8 C)   TempSrc:      SpO2: 94% 93% 94%   Weight:    53.9 kg  Height:        Intake/Output Summary (Last 24 hours) at 03/07/2021 0743 Last data filed at 03/07/2021 0400 Gross per 24 hour  Intake 8443.93 ml  Output --  Net 8443.93 ml   Filed Weights   03/05/21 2049 03/06/21 0431 03/07/21 0500  Weight: 53.4 kg 54 kg 53.9 kg    Examination:  General exam: Appears calm & comfortable   Respiratory system: clear breath sounds b/l  Cardiovascular system: S1 & S2+. No rubs or clicks   Gastrointestinal system: Abd is soft, NT, ND & normal bowel sounds  Central nervous system: alert and oriented. Moves all extremities  Psychiatry: Judgement and insight appears normal. Flat mood and affect     Data Reviewed: I have personally reviewed following labs and imaging studies  CBC: Recent Labs  Lab 03/04/21 1958 03/05/21 0731 03/06/21 0533 03/07/21 0513  WBC 16.9* 13.3* 13.6* 17.2*  NEUTROABS  --  11.3*  --   --   HGB 11.1* 9.8* 10.0* 11.1*  HCT 35.7* 31.2* 32.1* 35.3*  MCV 86.4 86.9 87.9 85.3  PLT 617* 544* 503* 742*   Basic Metabolic Panel: Recent Labs  Lab 03/04/21 1958 03/04/21 2146 03/05/21 0731 03/06/21 0533 03/07/21 0513  NA 135  --  134* 133*  136  K 3.2*  --  3.4* 3.6 3.5  CL 93*  --  97* 95* 99  CO2 24  --  20* 19* 20*  GLUCOSE 96  --  97 138* 117*  BUN 71*  --  75* 95* 90*  CREATININE 11.07*  --  11.32* 13.07* 11.15*  CALCIUM 5.9*  --  5.4* 5.9* 7.0*  MG  --  1.7 1.5* 2.3 2.1  PHOS  --  10.7* 11.2*  --   --    GFR: Estimated Creatinine Clearance: 5.2 mL/min (A) (by C-G formula based on SCr of 11.15 mg/dL (H)). Liver Function Tests: Recent Labs  Lab 03/04/21 2146 03/06/21 0533  AST 23  --   ALT 24  --   ALKPHOS 49  --   BILITOT 0.7  --   PROT 5.3*  --   ALBUMIN 1.8* 1.5*   Recent Labs  Lab 03/04/21 2146  LIPASE 30   No results for input(s): AMMONIA in the last 168 hours. Coagulation Profile: Recent  Labs  Lab 03/05/21 0731  INR 1.2   Cardiac Enzymes: No results for input(s): CKTOTAL, CKMB, CKMBINDEX, TROPONINI in the last 168 hours. BNP (last 3 results) No results for input(s): PROBNP in the last 8760 hours. HbA1C: No results for input(s): HGBA1C in the last 72 hours. CBG: No results for input(s): GLUCAP in the last 168 hours. Lipid Profile: No results for input(s): CHOL, HDL, LDLCALC, TRIG, CHOLHDL, LDLDIRECT in the last 72 hours. Thyroid Function Tests: No results for input(s): TSH, T4TOTAL, FREET4, T3FREE, THYROIDAB in the last 72 hours. Anemia Panel: No results for input(s): VITAMINB12, FOLATE, FERRITIN, TIBC, IRON, RETICCTPCT in the last 72 hours. Sepsis Labs: Recent Labs  Lab 03/04/21 2126 03/04/21 2255  PROCALCITON  --  2.67  LATICACIDVEN 1.7 1.5    Recent Results (from the past 240 hour(s))  Blood culture (routine x 2)     Status: None (Preliminary result)   Collection Time: 03/04/21  9:26 PM   Specimen: BLOOD  Result Value Ref Range Status   Specimen Description BLOOD LEFT FOREARM  Final   Special Requests   Final    BOTTLES DRAWN AEROBIC AND ANAEROBIC Blood Culture adequate volume   Culture   Final    NO GROWTH 3 DAYS Performed at [facility], [address], [place], [place] [address]    Report Status PENDING  Incomplete  Resp Panel by RT-PCR (Flu A&B, Covid) Nasopharyngeal Swab     Status: None   Collection Time: 03/04/21  9:26 PM   Specimen: Nasopharyngeal Swab; Nasopharyngeal(NP) swabs in vial transport medium  Result Value Ref Range Status   SARS Coronavirus 2 by RT PCR NEGATIVE NEGATIVE Final    Comment: (NOTE) SARS-CoV-2 target nucleic acids are NOT DETECTED.  The SARS-CoV-2 RNA is generally detectable in upper respiratory specimens during the acute phase of infection. The lowest concentration of SARS-CoV-2 viral copies this assay can detect is 138 copies/mL. A negative result does not preclude SARS-Cov-2 infection and should not  be used as the sole basis for treatment or other patient management decisions. A negative result may occur with  improper specimen collection/handling, submission of specimen other than nasopharyngeal swab, presence of viral mutation(s) within the areas targeted by this assay, and inadequate number of viral copies(<138 copies/mL). A negative result must be combined with clinical observations, patient history, and epidemiological information. The expected result is Negative.  Fact Sheet for Patients:  EntrepreneurPulse.com.au  Fact Sheet for Healthcare Providers:  IncredibleEmployment.be  This test is no t yet approved or cleared by the [place] and  has been authorized for detection and/or diagnosis of SARS-CoV-2 by FDA under an Emergency Use Authorization (EUA). This EUA will remain  in effect (meaning this test can be used) for the duration of the COVID-19 declaration under Section 564(b)(1) of the Act, 21 U.S.C.section 360bbb-3(b)(1), unless the authorization is terminated  or revoked sooner.       Influenza A by PCR NEGATIVE NEGATIVE Final   Influenza B by PCR NEGATIVE NEGATIVE Final    Comment: (NOTE) The Xpert Xpress SARS-CoV-2/FLU/RSV plus assay is intended as an aid in the diagnosis of influenza from Nasopharyngeal swab specimens and should not be used as a sole basis for treatment. Nasal washings and aspirates are unacceptable for Xpert Xpress SARS-CoV-2/FLU/RSV testing.  Fact Sheet for Patients: EntrepreneurPulse.com.au  Fact Sheet for Healthcare Providers: IncredibleEmployment.be  This test is not yet approved or cleared by the [place] FDA and has been authorized for detection and/or diagnosis of SARS-CoV-2 by FDA under an Emergency Use Authorization (EUA). This EUA will remain in effect (meaning this test can be used) for the duration of the COVID-19 declaration under Section  564(b)(1) of the Act, 21 U.S.C. section 360bbb-3(b)(1), unless the authorization is terminated or revoked.  Performed at [facility], [address] [place], [place] [address]   Blood culture (routine x 2)     Status: None (Preliminary result)   Collection Time: 03/04/21  9:46 PM   Specimen: BLOOD  Result Value Ref Range Status   Specimen Description BLOOD RIGHT HAND  Final   Special Requests   Final    BOTTLES DRAWN AEROBIC AND ANAEROBIC Blood Culture results may not be optimal due to an inadequate volume of blood received in culture bottles   Culture   Final    NO GROWTH 3 DAYS Performed at [facility], [address], [place], [place] [address]    Report Status PENDING  Incomplete         Radiology Studies: CT ABDOMEN PELVIS WO CONTRAST  Result Date: 03/05/2021 CLINICAL DATA:  Abdominal pain, nonlocalized. Peritoneal dialysis patient. Cloudy dialysate. EXAM: CT ABDOMEN AND PELVIS WITHOUT CONTRAST TECHNIQUE: Multidetector CT imaging of the abdomen and pelvis was performed following the standard protocol without IV contrast. RADIATION DOSE REDUCTION: This exam was performed according to the departmental dose-optimization program which includes automated exposure control, adjustment of the mA and/or kV according to patient size and/or use of iterative reconstruction technique. COMPARISON:  04/03/2018.  12/05/2020. FINDINGS: Lower chest: Lung bases are clear. Hepatobiliary: Chronic benign low densities within the liver probably representing cysts and of angiomas are unchanged. Question small stones or sludge in the gallbladder. Pancreas: No significant finding. Spleen: Normal Adrenals/Urinary Tract: Adrenal glands are normal. Native kidneys show atrophic change and chronic hydroureteronephrosis right more than left. Bladder is unremarkable. Stomach/Bowel: Stomach and small intestine appear normal. No sign of bowel obstruction or inflammation. Colon is within normal  limits. Moderate amount of stool and gas. Peritoneal dialysis catheter in place. Small amount of intraperitoneal fluid without evidence of loculated collection. Vascular/Lymphatic: Aortic atherosclerosis. Maximal diameter of the infrarenal abdominal aorta measures 3.2 cm. Reproductive: Normal Other: Small ventral hernias as seen previously contain less fluid. No sign of bowel extension or incarceration. Musculoskeletal: Scoliosis and degenerative change of the spine. Previous right femoral ORIF with hardware removal. IMPRESSION: Dialysis catheter remains in place. Small amount of intraperitoneal fluid without loculated collection. No  specific complicating feature by CT. Stable appearance of chronic and presumably benign low densities within the liver. Question small amount of sludge or tiny stones dependent in the gallbladder. Aortic atherosclerosis. Maximal diameter of the aorta 3.2 cm. Recommend follow-up ultrasound every 3 years. This recommendation follows ACR consensus guidelines: White Paper of the ACR Incidental Findings Committee II on Vascular Findings. J Am Coll Radiol 2013; 10:789-794. Electronically Signed   By: Nelson Chimes M.D.   On: 03/05/2021 19:42        Scheduled Meds:  aspirin EC  81 mg Oral Daily   atorvastatin  40 mg Oral QHS   calcitRIOL  0.5 mcg Oral Daily   calcium acetate  1,334 mg Oral BID WC   Chlorhexidine Gluconate Cloth  6 each Topical Daily   gentamicin cream  1 application Topical Daily   heparin  5,000 Units Subcutaneous Q8H   vancomycin variable dose per unstable renal function (pharmacist dosing)   Does not apply See admin instructions   Continuous Infusions:  sodium chloride Stopped (03/06/21 2353)   cefTAZidime (FORTAZ)  IV 1 g (03/06/21 2235)   dialysis solution 1.5% low-MG/low-CA Stopped (03/05/21 1429)   sodium chloride       LOS: 3 days    Time spent: 15 mins     Wyvonnia Dusky, MD [facility] Pager [phone]  If 7PM-7AM, please  contact night-coverage 03/07/2021, 7:43 AM

## 2021-03-07 NOTE — Progress Notes (Signed)
[facility]  ROUNDING NOTE   Subjective:   Peritoneal dialysis yesterday during the day. Tolerated treatment well. However effluent with fibrin and was cloudy.   Pizza boxes at bedside.   States diarrhea has improved.  Continues to complain of abdominal pain.   Afebrile  Wbc 17.2 (13.6)  Outpatient labs from 1/11 show gram positive cocci is pairs and clusters. WBC of 3398. Consistent with peritonitis.   Objective:  Vital signs in last 24 hours:  Temp:  [97.7 F (36.5 C)-98.5 F (36.9 C)] 98 F (36.7 C) (01/14 0817) Pulse Rate:  [78-104] 101 (01/14 0817) Resp:  [16-19] 19 (01/14 0817) BP: (129-164)/(91-112) 129/91 (01/14 0817) SpO2:  [92 %-95 %] 95 % (01/14 0817) Weight:  [53.9 kg] 53.9 kg (01/14 0500)  Weight change: 0.512 kg Filed Weights   03/05/21 2049 03/06/21 0431 03/07/21 0500  Weight: 53.4 kg 54 kg 53.9 kg    Intake/Output: I/O last 3 completed shifts: In: 9043.9 [P.O.:840; I.V.:3.9; Other:8000; IV Piggyback:200] Out: -    Intake/Output this shift:  No intake/output data recorded.  Physical Exam: General: NAD, laying in bed  Head: Normocephalic, atraumatic. Moist oral mucosal membranes  Eyes: Anicteric  Lungs:  Clear to auscultation, normal effort  Heart: Regular rate and rhythm  Abdomen:  +tender to palpation  Extremities:  no peripheral edema.  Neurologic: Nonfocal, moving all four extremities  Skin: No lesions  Access: PD catheter    Basic Metabolic Panel: Recent Labs  Lab 03/04/21 1958 03/04/21 2146 03/05/21 0731 03/06/21 0533 03/07/21 0513  NA 135  --  134* 133* 136  K 3.2*  --  3.4* 3.6 3.5  CL 93*  --  97* 95* 99  CO2 24  --  20* 19* 20*  GLUCOSE 96  --  97 138* 117*  BUN 71*  --  75* 95* 90*  CREATININE 11.07*  --  11.32* 13.07* 11.15*  CALCIUM 5.9*  --  5.4* 5.9* 7.0*  MG  --  1.7 1.5* 2.3 2.1  PHOS  --  10.7* 11.2*  --   --      Liver Function Tests: Recent Labs  Lab 03/04/21 2146 03/06/21 0533  AST 23   --   ALT 24  --   ALKPHOS 49  --   BILITOT 0.7  --   PROT 5.3*  --   ALBUMIN 1.8* 1.5*    Recent Labs  Lab 03/04/21 2146  LIPASE 30    No results for input(s): AMMONIA in the last 168 hours.  CBC: Recent Labs  Lab 03/04/21 1958 03/05/21 0731 03/06/21 0533 03/07/21 0513  WBC 16.9* 13.3* 13.6* 17.2*  NEUTROABS  --  11.3*  --   --   HGB 11.1* 9.8* 10.0* 11.1*  HCT 35.7* 31.2* 32.1* 35.3*  MCV 86.4 86.9 87.9 85.3  PLT 617* 544* 503* 567*     Cardiac Enzymes: No results for input(s): CKTOTAL, CKMB, CKMBINDEX, TROPONINI in the last 168 hours.  BNP: Invalid input(s): POCBNP  CBG: No results for input(s): GLUCAP in the last 168 hours.  Microbiology: Results for orders placed or performed during the hospital encounter of 03/04/21  Blood culture (routine x 2)     Status: None (Preliminary result)   Collection Time: 03/04/21  9:26 PM   Specimen: BLOOD  Result Value Ref Range Status   Specimen Description BLOOD LEFT FOREARM  Final   Special Requests   Final    BOTTLES DRAWN AEROBIC AND ANAEROBIC Blood Culture adequate volume  Culture   Final    NO GROWTH 3 DAYS Performed at [facility], [address] [place], [place] [address]    Report Status PENDING  Incomplete  Resp Panel by RT-PCR (Flu A&B, Covid) Nasopharyngeal Swab     Status: None   Collection Time: 03/04/21  9:26 PM   Specimen: Nasopharyngeal Swab; Nasopharyngeal(NP) swabs in vial transport medium  Result Value Ref Range Status   SARS Coronavirus 2 by RT PCR NEGATIVE NEGATIVE Final    Comment: (NOTE) SARS-CoV-2 target nucleic acids are NOT DETECTED.  The SARS-CoV-2 RNA is generally detectable in upper respiratory specimens during the acute phase of infection. The lowest concentration of SARS-CoV-2 viral copies this assay can detect is 138 copies/mL. A negative result does not preclude SARS-Cov-2 infection and should not be used as the sole basis for treatment or other patient management  decisions. A negative result may occur with  improper specimen collection/handling, submission of specimen other than nasopharyngeal swab, presence of viral mutation(s) within the areas targeted by this assay, and inadequate number of viral copies(<138 copies/mL). A negative result must be combined with clinical observations, patient history, and epidemiological information. The expected result is Negative.  Fact Sheet for Patients:  EntrepreneurPulse.com.au  Fact Sheet for Healthcare Providers:  IncredibleEmployment.be  This test is no t yet approved or cleared by the [place] FDA and  has been authorized for detection and/or diagnosis of SARS-CoV-2 by FDA under an Emergency Use Authorization (EUA). This EUA will remain  in effect (meaning this test can be used) for the duration of the COVID-19 declaration under Section 564(b)(1) of the Act, 21 U.S.C.section 360bbb-3(b)(1), unless the authorization is terminated  or revoked sooner.       Influenza A by PCR NEGATIVE NEGATIVE Final   Influenza B by PCR NEGATIVE NEGATIVE Final    Comment: (NOTE) The Xpert Xpress SARS-CoV-2/FLU/RSV plus assay is intended as an aid in the diagnosis of influenza from Nasopharyngeal swab specimens and should not be used as a sole basis for treatment. Nasal washings and aspirates are unacceptable for Xpert Xpress SARS-CoV-2/FLU/RSV testing.  Fact Sheet for Patients: EntrepreneurPulse.com.au  Fact Sheet for Healthcare Providers: IncredibleEmployment.be  This test is not yet approved or cleared by the [place] FDA and has been authorized for detection and/or diagnosis of SARS-CoV-2 by FDA under an Emergency Use Authorization (EUA). This EUA will remain in effect (meaning this test can be used) for the duration of the COVID-19 declaration under Section 564(b)(1) of the Act, 21 U.S.C. section 360bbb-3(b)(1), unless the  authorization is terminated or revoked.  Performed at [facility], [address] [place], [place] [address]   Blood culture (routine x 2)     Status: None (Preliminary result)   Collection Time: 03/04/21  9:46 PM   Specimen: BLOOD  Result Value Ref Range Status   Specimen Description BLOOD RIGHT HAND  Final   Special Requests   Final    BOTTLES DRAWN AEROBIC AND ANAEROBIC Blood Culture results may not be optimal due to an inadequate volume of blood received in culture bottles   Culture   Final    NO GROWTH 3 DAYS Performed at [facility], [address], [place], [place] [address]    Report Status PENDING  Incomplete    Coagulation Studies: Recent Labs    03/05/21 0731  LABPROT 15.6*  INR 1.2     Urinalysis: No results for input(s): COLORURINE, LABSPEC, PHURINE, GLUCOSEU, HGBUR, BILIRUBINUR, KETONESUR, PROTEINUR, UROBILINOGEN,  NITRITE, LEUKOCYTESUR in the last 72 hours.  Invalid input(s): APPERANCEUR    Imaging: CT ABDOMEN PELVIS WO CONTRAST  Result Date: 03/05/2021 CLINICAL DATA:  Abdominal pain, nonlocalized. Peritoneal dialysis patient. Cloudy dialysate. EXAM: CT ABDOMEN AND PELVIS WITHOUT CONTRAST TECHNIQUE: Multidetector CT imaging of the abdomen and pelvis was performed following the standard protocol without IV contrast. RADIATION DOSE REDUCTION: This exam was performed according to the departmental dose-optimization program which includes automated exposure control, adjustment of the mA and/or kV according to patient size and/or use of iterative reconstruction technique. COMPARISON:  04/03/2018.  12/05/2020. FINDINGS: Lower chest: Lung bases are clear. Hepatobiliary: Chronic benign low densities within the liver probably representing cysts and of angiomas are unchanged. Question small stones or sludge in the gallbladder. Pancreas: No significant finding. Spleen: Normal Adrenals/Urinary Tract: Adrenal glands are normal. Native kidneys show atrophic  change and chronic hydroureteronephrosis right more than left. Bladder is unremarkable. Stomach/Bowel: Stomach and small intestine appear normal. No sign of bowel obstruction or inflammation. Colon is within normal limits. Moderate amount of stool and gas. Peritoneal dialysis catheter in place. Small amount of intraperitoneal fluid without evidence of loculated collection. Vascular/Lymphatic: Aortic atherosclerosis. Maximal diameter of the infrarenal abdominal aorta measures 3.2 cm. Reproductive: Normal Other: Small ventral hernias as seen previously contain less fluid. No sign of bowel extension or incarceration. Musculoskeletal: Scoliosis and degenerative change of the spine. Previous right femoral ORIF with hardware removal. IMPRESSION: Dialysis catheter remains in place. Small amount of intraperitoneal fluid without loculated collection. No specific complicating feature by CT. Stable appearance of chronic and presumably benign low densities within the liver. Question small amount of sludge or tiny stones dependent in the gallbladder. Aortic atherosclerosis. Maximal diameter of the aorta 3.2 cm. Recommend follow-up ultrasound every 3 years. This recommendation follows ACR consensus guidelines: White Paper of the ACR Incidental Findings Committee II on Vascular Findings. J Am Coll Radiol 2013; 10:789-794. Electronically Signed   By: Nelson Chimes M.D.   On: 03/05/2021 19:42     Medications:    sodium chloride Stopped (03/06/21 2353)   cefTAZidime (FORTAZ)  IV 1 g (03/06/21 2235)   dialysis solution 1.5% low-MG/low-CA Stopped (03/05/21 1429)   sodium chloride      aspirin EC  81 mg Oral Daily   atorvastatin  40 mg Oral QHS   calcitRIOL  0.5 mcg Oral Daily   calcium acetate  1,334 mg Oral BID WC   Chlorhexidine Gluconate Cloth  6 each Topical Daily   gentamicin cream  1 application Topical Daily   heparin  5,000 Units Subcutaneous Q8H   vancomycin variable dose per unstable renal function (pharmacist  dosing)   Does not apply See admin instructions   sodium chloride, acetaminophen **OR** acetaminophen, benzonatate, heparin, ipratropium-albuterol, morphine injection, ondansetron **OR** ondansetron (ZOFRAN) IV  Assessment/ Plan:  [name] is a 66 y.o. white male with end stage renal disease on peritoneal dialysis, hypertension, hyperlipidemia, coronary artery disease status post CABG, history of bladder cancer, COPD with ongoing tobacco use, who is admitted to [facility] on 03/04/2021 for Spontaneous bacterial peritonitis ([place]) [K65.2] SBP (spontaneous bacterial peritonitis) ([place]) [K65.2]  [facility] admitted for Spontaneous bacterial peritonitis ([place]) [K65.2] SBP (spontaneous bacterial peritonitis) ([place]) [K65.2]   [facility] [address] 55kg.  [facility] 8 hours 4 exchanges 2 liter fill  End stage renal disease on peritoneal dialysis. Peritoneal dialysis for tonight. Orders prepared. Mix of 1.5% and 2.5% dextrose.   2. Anemia of chronic kidney disease:  hemoglobin 11.1, Mircera as outpatient.   3. Secondary Hyperparathyroidism: with hyperphosphatemia and hypocalcemia. Eating pizza and not being complaint with his binders.   - Continue calcitriol  - calcium acetate with meals. Increase to 3 tabs with meals.   4. Peritonitis:  Awaiting cultures drawn prior to admission on 1/11. Empirically treated with Vancomycin and Ceftazidime.   - Appreciate ID input - Continue empiric antibiotics  5. Hypertension with chronic kidney disease: 129/91. Currently holding home regimen of irbesartan and metoprolol.    LOS: 3 Evelene Roussin 1/14/202311:38 AM

## 2021-03-08 LAB — CBC
HCT: 29.5 % — ABNORMAL LOW (ref 39.0–52.0)
Hemoglobin: 9.4 g/dL — ABNORMAL LOW (ref 13.0–17.0)
MCH: 27.3 pg (ref 26.0–34.0)
MCHC: 31.9 g/dL (ref 30.0–36.0)
MCV: 85.8 fL (ref 80.0–100.0)
Platelets: 491 10*3/uL — ABNORMAL HIGH (ref 150–400)
RBC: 3.44 MIL/uL — ABNORMAL LOW (ref 4.22–5.81)
RDW: 14.8 % (ref 11.5–15.5)
WBC: 12.6 10*3/uL — ABNORMAL HIGH (ref 4.0–10.5)
nRBC: 0 % (ref 0.0–0.2)

## 2021-03-08 LAB — BASIC METABOLIC PANEL
Anion gap: 19 — ABNORMAL HIGH (ref 5–15)
BUN: 94 mg/dL — ABNORMAL HIGH (ref 8–23)
CO2: 17 mmol/L — ABNORMAL LOW (ref 22–32)
Calcium: 6.9 mg/dL — ABNORMAL LOW (ref 8.9–10.3)
Chloride: 101 mmol/L (ref 98–111)
Creatinine, Ser: 10.99 mg/dL — ABNORMAL HIGH (ref 0.61–1.24)
GFR, Estimated: 5 mL/min — ABNORMAL LOW (ref >60)
Glucose, Bld: 88 mg/dL (ref 70–99)
Potassium: 3.8 mmol/L (ref 3.5–5.1)
Sodium: 137 mmol/L (ref 135–145)

## 2021-03-08 LAB — VANCOMYCIN, RANDOM: Vancomycin Rm: 20

## 2021-03-08 LAB — MAGNESIUM: Magnesium: 2.1 mg/dL (ref 1.7–2.4)

## 2021-03-08 MED ORDER — OXYCODONE-ACETAMINOPHEN 5-325 MG PO TABS
1.0000 | ORAL_TABLET | Freq: Four times a day (QID) | ORAL | Status: DC | PRN
Start: 2021-03-08 — End: 2021-03-08
  Administered 2021-03-08: 1 via ORAL
  Filled 2021-03-08: qty 1

## 2021-03-08 MED ORDER — IRBESARTAN 150 MG PO TABS
150.0000 mg | ORAL_TABLET | Freq: Every day | ORAL | Status: DC
  Administered 2021-03-08 – 2021-03-09 (×2): 150 mg via ORAL
  Filled 2021-03-08 (×2): qty 1

## 2021-03-08 MED ORDER — MORPHINE SULFATE (PF) 2 MG/ML IV SOLN
1.0000 mg | INTRAVENOUS | Status: DC | PRN
Start: 2021-03-08 — End: 2021-03-10
  Administered 2021-03-08 (×2): 1 mg via INTRAVENOUS
  Filled 2021-03-08 (×2): qty 1

## 2021-03-08 MED ORDER — OXYCODONE-ACETAMINOPHEN 5-325 MG PO TABS
2.0000 | ORAL_TABLET | Freq: Four times a day (QID) | ORAL | Status: DC | PRN
  Administered 2021-03-08 – 2021-03-09 (×3): 2 via ORAL
  Filled 2021-03-08 (×3): qty 2

## 2021-03-08 MED ORDER — VANCOMYCIN HCL IN DEXTROSE 1-5 GM/200ML-% IV SOLN
1000.0000 mg | Freq: Once | INTRAVENOUS | Status: AC
  Administered 2021-03-09: 1000 mg via INTRAVENOUS
  Filled 2021-03-08: qty 200

## 2021-03-08 NOTE — Progress Notes (Signed)
MEWS protocol was not implemented as patient recheck vital signs were WNL.   03/08/21 0013  Assess: MEWS Score  Temp 98.6 F (37 C)  BP (!) 126/94  ECG Heart Rate (!) 103  Resp (!) 23  SpO2 99 %  O2 Device Room Air  Assess: MEWS Score  MEWS Temp 0  MEWS Systolic 0  MEWS Pulse 1  MEWS RR 1  MEWS LOC 0  MEWS Score 2  MEWS Score Color Yellow  Assess: if the MEWS score is Yellow or Red  Were vital signs taken at a resting state? No  Focused Assessment No change from prior assessment  Does the patient meet 2 or more of the SIRS criteria? No  MEWS guidelines implemented *See Row Information* No, vital signs rechecked  Treat  Pain Scale 0-10  Pain Score 6  Pain Location Abdomen  Pain Intervention(s) RN made aware  Document  Progress note created (see row info) Yes  Assess: SIRS CRITERIA  SIRS Temperature  0  SIRS Pulse 1  SIRS Respirations  1  SIRS WBC 0  SIRS Score Sum  2

## 2021-03-08 NOTE — Progress Notes (Signed)
PROGRESS NOTE    Anthony Hess  ZOX:[id number] DOB: April 29, 1955 DOA: 03/04/2021 PCP: Pcp, No    Assessment & Plan:   Principal Problem:   Spontaneous bacterial peritonitis ([place]) Active Problems:   Cigarette smoker   History of bladder cancer   Hypertension   Bilateral hydronephrosis   Emphysema lung (HCC)   S/P CABG x 4   Atrial fibrillation (HCC)   ESRD (end stage renal disease) (HCC)   Anemia in ESRD (end-stage renal disease) (HCC)   Hyperlipidemia   Anemia in chronic kidney disease (CODE)   Leukocytosis   Abdominal pain: etiology unclear, possible SBP. Procal 2.67. Blood cxs NGTD. Continue on IV vanco, ceftazidime as per nephro. Peritoneal fluid cx growing stap, sens pending. (Cx was obtained outpatient by nephro)   ESRD: on PD. Dialysis as per nephro   ACD: likely secondary to ESRD. H&H are labile   Thrombocytosis: labile, etiology unclear.   Hypocalcemia: labile, management as per nephro   Hypomagnesemia: WNL today  Hyponatremia: resolved    Cough: CXR was neg for any acute findings. Tessalon pearls prn. Encourage incentive spirometry    Diarrhea: w/ recent abx use. Resolved    Hx of CAD: s/p CABG. Continue on metoprolol, irbesartan, aspirin, & statin   HLD: continue on statin    HTN: continue on home dose of metoprolol, irbesartan   Hx of bladder cancer: s/p transurethral resection. Management per onco outpatient     DVT prophylaxis: heparin  Code Status: full  Family Communication:  Disposition Plan: likely d/c back home  Level of care: Telemetry Cardiac  Status is: Inpatient  Remains inpatient appropriate because: severity of illness    Consultants:  ID Nephro  Procedures:   Antimicrobials: vanco, ceftazidime    Subjective: Pt c/o pain   Objective: Vitals:   03/07/21 2355 03/08/21 0013 03/08/21 0015 03/08/21 0513  BP: (!) 139/103 (!) 126/94 128/90 (!) 150/113  Pulse: (!) 104  99 89  Resp: 17 (!) 23 19 19   Temp: 99.4 F (37.4  C) 98.6 F (37 C) 98.6 F (37 C) 98.3 F (36.8 C)  TempSrc: Oral Oral  Oral  SpO2: 94% 99% 99% 95%  Weight:  56.6 kg  55.8 kg  Height:        Intake/Output Summary (Last 24 hours) at 03/08/2021 0758 Last data filed at 03/08/2021 0100 Gross per 24 hour  Intake 1420 ml  Output --  Net 1420 ml   Filed Weights   03/07/21 0500 03/08/21 0013 03/08/21 0513  Weight: 53.9 kg 56.6 kg 55.8 kg    Examination:  General exam: Appears comfortable   Respiratory system: clear breath sounds b/l  Cardiovascular system: S1/S2+. No rubs or clicks    Gastrointestinal system: Abd is soft, NT, ND & normal bowel sounds  Central nervous system: alert and oriented. Moves all extremities  Psychiatry: Judgement and insight appears normal. Flat mood and affect     Data Reviewed: I have personally reviewed following labs and imaging studies  CBC: Recent Labs  Lab 03/04/21 1958 03/05/21 0731 03/06/21 0533 03/07/21 0513 03/08/21 0539  WBC 16.9* 13.3* 13.6* 17.2* 12.6*  NEUTROABS  --  11.3*  --   --   --   HGB 11.1* 9.8* 10.0* 11.1* 9.4*  HCT 35.7* 31.2* 32.1* 35.3* 29.5*  MCV 86.4 86.9 87.9 85.3 85.8  PLT 617* 544* 503* 567* 811*   Basic Metabolic Panel: Recent Labs  Lab 03/04/21 1958 03/04/21 2146 03/05/21 9147 03/06/21 0533 03/07/21 0513 03/08/21 8295  NA 135  --  134* 133* 136 137  K 3.2*  --  3.4* 3.6 3.5 3.8  CL 93*  --  97* 95* 99 101  CO2 24  --  20* 19* 20* 17*  GLUCOSE 96  --  97 138* 117* 88  BUN 71*  --  75* 95* 90* 94*  CREATININE 11.07*  --  11.32* 13.07* 11.15* 10.99*  CALCIUM 5.9*  --  5.4* 5.9* 7.0* 6.9*  MG  --  1.7 1.5* 2.3 2.1 2.1  PHOS  --  10.7* 11.2*  --   --   --    GFR: Estimated Creatinine Clearance: 5.4 mL/min (A) (by C-G formula based on SCr of 10.99 mg/dL (H)). Liver Function Tests: Recent Labs  Lab 03/04/21 2146 03/06/21 0533  AST 23  --   ALT 24  --   ALKPHOS 49  --   BILITOT 0.7  --   PROT 5.3*  --   ALBUMIN 1.8* 1.5*   Recent Labs   Lab 03/04/21 2146  LIPASE 30   No results for input(s): AMMONIA in the last 168 hours. Coagulation Profile: Recent Labs  Lab 03/05/21 0731  INR 1.2   Cardiac Enzymes: No results for input(s): CKTOTAL, CKMB, CKMBINDEX, TROPONINI in the last 168 hours. BNP (last 3 results) No results for input(s): PROBNP in the last 8760 hours. HbA1C: No results for input(s): HGBA1C in the last 72 hours. CBG: No results for input(s): GLUCAP in the last 168 hours. Lipid Profile: No results for input(s): CHOL, HDL, LDLCALC, TRIG, CHOLHDL, LDLDIRECT in the last 72 hours. Thyroid Function Tests: No results for input(s): TSH, T4TOTAL, FREET4, T3FREE, THYROIDAB in the last 72 hours. Anemia Panel: No results for input(s): VITAMINB12, FOLATE, FERRITIN, TIBC, IRON, RETICCTPCT in the last 72 hours. Sepsis Labs: Recent Labs  Lab 03/04/21 2126 03/04/21 2255  PROCALCITON  --  2.67  LATICACIDVEN 1.7 1.5    Recent Results (from the past 240 hour(s))  Blood culture (routine x 2)     Status: None (Preliminary result)   Collection Time: 03/04/21  9:26 PM   Specimen: BLOOD  Result Value Ref Range Status   Specimen Description BLOOD LEFT FOREARM  Final   Special Requests   Final    BOTTLES DRAWN AEROBIC AND ANAEROBIC Blood Culture adequate volume   Culture   Final    NO GROWTH 4 DAYS Performed at [facility], [address] [place], [place] [address]    Report Status PENDING  Incomplete  Resp Panel by RT-PCR (Flu A&B, Covid) Nasopharyngeal Swab     Status: None   Collection Time: 03/04/21  9:26 PM   Specimen: Nasopharyngeal Swab; Nasopharyngeal(NP) swabs in vial transport medium  Result Value Ref Range Status   SARS Coronavirus 2 by RT PCR NEGATIVE NEGATIVE Final    Comment: (NOTE) SARS-CoV-2 target nucleic acids are NOT DETECTED.  The SARS-CoV-2 RNA is generally detectable in upper respiratory specimens during the acute phase of infection. The lowest concentration of SARS-CoV-2 viral  copies this assay can detect is 138 copies/mL. A negative result does not preclude SARS-Cov-2 infection and should not be used as the sole basis for treatment or other patient management decisions. A negative result may occur with  improper specimen collection/handling, submission of specimen other than nasopharyngeal swab, presence of viral mutation(s) within the areas targeted by this assay, and inadequate number of viral copies(<138 copies/mL). A negative result must be combined with clinical observations, patient history, and epidemiological information.  The expected result is Negative.  Fact Sheet for Patients:  EntrepreneurPulse.com.au  Fact Sheet for Healthcare Providers:  IncredibleEmployment.be  This test is no t yet approved or cleared by the [place] FDA and  has been authorized for detection and/or diagnosis of SARS-CoV-2 by FDA under an Emergency Use Authorization (EUA). This EUA will remain  in effect (meaning this test can be used) for the duration of the COVID-19 declaration under Section 564(b)(1) of the Act, 21 U.S.C.section 360bbb-3(b)(1), unless the authorization is terminated  or revoked sooner.       Influenza A by PCR NEGATIVE NEGATIVE Final   Influenza B by PCR NEGATIVE NEGATIVE Final    Comment: (NOTE) The Xpert Xpress SARS-CoV-2/FLU/RSV plus assay is intended as an aid in the diagnosis of influenza from Nasopharyngeal swab specimens and should not be used as a sole basis for treatment. Nasal washings and aspirates are unacceptable for Xpert Xpress SARS-CoV-2/FLU/RSV testing.  Fact Sheet for Patients: EntrepreneurPulse.com.au  Fact Sheet for Healthcare Providers: IncredibleEmployment.be  This test is not yet approved or cleared by the [place] FDA and has been authorized for detection and/or diagnosis of SARS-CoV-2 by FDA under an Emergency Use Authorization (EUA). This  EUA will remain in effect (meaning this test can be used) for the duration of the COVID-19 declaration under Section 564(b)(1) of the Act, 21 U.S.C. section 360bbb-3(b)(1), unless the authorization is terminated or revoked.  Performed at [facility], [address] [place], [place] [address]   Blood culture (routine x 2)     Status: None (Preliminary result)   Collection Time: 03/04/21  9:46 PM   Specimen: BLOOD  Result Value Ref Range Status   Specimen Description BLOOD RIGHT HAND  Final   Special Requests   Final    BOTTLES DRAWN AEROBIC AND ANAEROBIC Blood Culture results may not be optimal due to an inadequate volume of blood received in culture bottles   Culture   Final    NO GROWTH 4 DAYS Performed at [facility], [address], [place],  [address]    Report Status PENDING  Incomplete         Radiology Studies: No results found.      Scheduled Meds:  aspirin EC  81 mg Oral Daily   atorvastatin  40 mg Oral QHS   calcitRIOL  0.5 mcg Oral Daily   calcium acetate  2,001 mg Oral BID WC   Chlorhexidine Gluconate Cloth  6 each Topical Daily   gentamicin cream  1 application Topical Daily   heparin  5,000 Units Subcutaneous Q8H   metoprolol succinate  50 mg Oral Daily   vancomycin variable dose per unstable renal function (pharmacist dosing)   Does not apply See admin instructions   Continuous Infusions:  sodium chloride Stopped (03/06/21 2353)   cefTAZidime (FORTAZ)  IV 1 g (03/08/21 0000)   dialysis solution 1.5% low-MG/low-CA Stopped (03/05/21 1429)   sodium chloride       LOS: 4 days    Time spent: 15 mins     Wyvonnia Dusky, MD [facility] Pager [phone]  If 7PM-7AM, please contact night-coverage 03/08/2021, 7:58 AM

## 2021-03-08 NOTE — Progress Notes (Signed)
[facility]  ROUNDING NOTE   Subjective:   Seen and examined on peritoneal dialysis.   Cultures now with Staph species  Objective:  Vital signs in last 24 hours:  Temp:  [98 F (36.7 C)-99.4 F (37.4 C)] 98.9 F (37.2 C) (01/15 1055) Pulse Rate:  [89-104] 92 (01/15 0859) Resp:  [17-23] 21 (01/15 1105) BP: (101-150)/(90-113) 133/99 (01/15 1105) SpO2:  [90 %-99 %] 96 % (01/15 1105) Weight:  [55.8 kg-56.6 kg] 55.8 kg (01/15 0513)  Weight change: 2.7 kg Filed Weights   03/07/21 0500 03/08/21 0013 03/08/21 0513  Weight: 53.9 kg 56.6 kg 55.8 kg    Intake/Output: I/O last 3 completed shifts: In: 9523.9 [P.O.:1320; I.V.:3.9; Other:8000; IV Piggyback:200] Out: -    Intake/Output this shift:  Total I/O In: 8000 [Other:8000] Out: -   Physical Exam: General: NAD, laying in bed  Head: Normocephalic, atraumatic. Moist oral mucosal membranes  Eyes: Anicteric  Lungs:  Clear to auscultation, normal effort  Heart: Regular rate and rhythm  Abdomen:  +tender to palpation  Extremities:  no peripheral edema.  Neurologic: Nonfocal, moving all four extremities  Skin: No lesions  Access: PD catheter    Basic Metabolic Panel: Recent Labs  Lab 03/04/21 1958 03/04/21 2146 03/05/21 0731 03/06/21 0533 03/07/21 0513 03/08/21 0539  NA 135  --  134* 133* 136 137  K 3.2*  --  3.4* 3.6 3.5 3.8  CL 93*  --  97* 95* 99 101  CO2 24  --  20* 19* 20* 17*  GLUCOSE 96  --  97 138* 117* 88  BUN 71*  --  75* 95* 90* 94*  CREATININE 11.07*  --  11.32* 13.07* 11.15* 10.99*  CALCIUM 5.9*  --  5.4* 5.9* 7.0* 6.9*  MG  --  1.7 1.5* 2.3 2.1 2.1  PHOS  --  10.7* 11.2*  --   --   --      Liver Function Tests: Recent Labs  Lab 03/04/21 2146 03/06/21 0533  AST 23  --   ALT 24  --   ALKPHOS 49  --   BILITOT 0.7  --   PROT 5.3*  --   ALBUMIN 1.8* 1.5*    Recent Labs  Lab 03/04/21 2146  LIPASE 30    No results for input(s): AMMONIA in the last 168  hours.  CBC: Recent Labs  Lab 03/04/21 1958 03/05/21 0731 03/06/21 0533 03/07/21 0513 03/08/21 0539  WBC 16.9* 13.3* 13.6* 17.2* 12.6*  NEUTROABS  --  11.3*  --   --   --   HGB 11.1* 9.8* 10.0* 11.1* 9.4*  HCT 35.7* 31.2* 32.1* 35.3* 29.5*  MCV 86.4 86.9 87.9 85.3 85.8  PLT 617* 544* 503* 567* 491*     Cardiac Enzymes: No results for input(s): CKTOTAL, CKMB, CKMBINDEX, TROPONINI in the last 168 hours.  BNP: Invalid input(s): POCBNP  CBG: No results for input(s): GLUCAP in the last 168 hours.  Microbiology: Results for orders placed or performed during the hospital encounter of 03/04/21  Blood culture (routine x 2)     Status: None (Preliminary result)   Collection Time: 03/04/21  9:26 PM   Specimen: BLOOD  Result Value Ref Range Status   Specimen Description BLOOD LEFT FOREARM  Final   Special Requests   Final    BOTTLES DRAWN AEROBIC AND ANAEROBIC Blood Culture adequate volume   Culture   Final    NO GROWTH 4 DAYS Performed at [facility], [address] [place], [place] [address]    Report Status PENDING  Incomplete  Resp Panel by RT-PCR (Flu A&B, Covid) Nasopharyngeal Swab     Status: None   Collection Time: 03/04/21  9:26 PM   Specimen: Nasopharyngeal Swab; Nasopharyngeal(NP) swabs in vial transport medium  Result Value Ref Range Status   SARS Coronavirus 2 by RT PCR NEGATIVE NEGATIVE Final    Comment: (NOTE) SARS-CoV-2 target nucleic acids are NOT DETECTED.  The SARS-CoV-2 RNA is generally detectable in upper respiratory specimens during the acute phase of infection. The lowest concentration of SARS-CoV-2 viral copies this assay can detect is 138 copies/mL. A negative result does not preclude SARS-Cov-2 infection and should not be used as the sole basis for treatment or other patient management decisions. A negative result may occur with  improper specimen collection/handling, submission of specimen other than nasopharyngeal swab, presence of  viral mutation(s) within the areas targeted by this assay, and inadequate number of viral copies(<138 copies/mL). A negative result must be combined with clinical observations, patient history, and epidemiological information. The expected result is Negative.  Fact Sheet for Patients:  EntrepreneurPulse.com.au  Fact Sheet for Healthcare Providers:  IncredibleEmployment.be  This test is no t yet approved or cleared by the [place] FDA and  has been authorized for detection and/or diagnosis of SARS-CoV-2 by FDA under an Emergency Use Authorization (EUA). This EUA will remain  in effect (meaning this test can be used) for the duration of the COVID-19 declaration under Section 564(b)(1) of the Act, 21 U.S.C.section 360bbb-3(b)(1), unless the authorization is terminated  or revoked sooner.       Influenza A by PCR NEGATIVE NEGATIVE Final   Influenza B by PCR NEGATIVE NEGATIVE Final    Comment: (NOTE) The Xpert Xpress SARS-CoV-2/FLU/RSV plus assay is intended as an aid in the diagnosis of influenza from Nasopharyngeal swab specimens and should not be used as a sole basis for treatment. Nasal washings and aspirates are unacceptable for Xpert Xpress SARS-CoV-2/FLU/RSV testing.  Fact Sheet for Patients: EntrepreneurPulse.com.au  Fact Sheet for Healthcare Providers: IncredibleEmployment.be  This test is not yet approved or cleared by the [place] FDA and has been authorized for detection and/or diagnosis of SARS-CoV-2 by FDA under an Emergency Use Authorization (EUA). This EUA will remain in effect (meaning this test can be used) for the duration of the COVID-19 declaration under Section 564(b)(1) of the Act, 21 U.S.C. section 360bbb-3(b)(1), unless the authorization is terminated or revoked.  Performed at [facility], [address] [place], [place] [address]   Blood culture (routine x  2)     Status: None (Preliminary result)   Collection Time: 03/04/21  9:46 PM   Specimen: BLOOD  Result Value Ref Range Status   Specimen Description BLOOD RIGHT HAND  Final   Special Requests   Final    BOTTLES DRAWN AEROBIC AND ANAEROBIC Blood Culture results may not be optimal due to an inadequate volume of blood received in culture bottles   Culture   Final    NO GROWTH 4 DAYS Performed at [facility], [address] [place], [place] [address]    Report Status PENDING  Incomplete    Coagulation Studies: No results for input(s): LABPROT, INR in the last 72 hours.   Urinalysis: No results for input(s): COLORURINE, LABSPEC, PHURINE, GLUCOSEU, HGBUR, BILIRUBINUR, KETONESUR, PROTEINUR, UROBILINOGEN, NITRITE, LEUKOCYTESUR in the last 72 hours.  Invalid input(s): APPERANCEUR    Imaging: No results found.   Medications:  sodium chloride Stopped (03/06/21 2353)   cefTAZidime (FORTAZ)  IV 1 g (03/08/21 0000)   dialysis solution 1.5% low-MG/low-CA Stopped (03/05/21 1429)   sodium chloride      aspirin EC  81 mg Oral Daily   atorvastatin  40 mg Oral QHS   calcitRIOL  0.5 mcg Oral Daily   calcium acetate  2,001 mg Oral BID WC   Chlorhexidine Gluconate Cloth  6 each Topical Daily   gentamicin cream  1 application Topical Daily   heparin  5,000 Units Subcutaneous Q8H   metoprolol succinate  50 mg Oral Daily   vancomycin variable dose per unstable renal function (pharmacist dosing)   Does not apply See admin instructions   sodium chloride, acetaminophen **OR** acetaminophen, benzonatate, heparin, ipratropium-albuterol, ondansetron **OR** ondansetron (ZOFRAN) IV, oxyCODONE-acetaminophen  Assessment/ Plan:  [name] is a 66 y.o. white male with end stage renal disease on peritoneal dialysis, hypertension, hyperlipidemia, coronary artery disease status post CABG, history of bladder cancer, COPD with ongoing tobacco use, who is admitted to [facility] on 03/04/2021 for  Spontaneous bacterial peritonitis ([place]) [K65.2] SBP (spontaneous bacterial peritonitis) ([place]) [K65.2]  [facility] [place] admitted for Spontaneous bacterial peritonitis ([place]) [K65.2] SBP (spontaneous bacterial peritonitis) ([place]) [K65.2]   [facility] [address] 55kg.  [facility] 8 hours 4 exchanges 2 liter fill  End stage renal disease on peritoneal dialysis. Continue nightly peritoneal dialysis. Orders prepared.   2. Anemia of chronic kidney disease: hemoglobin 9.4, Mircera as outpatient.   3. Secondary Hyperparathyroidism: with hyperphosphatemia and hypocalcemia.    - Continue calcitriol  - calcium acetate with meals. Increased to 3 tabs with meals.   4. Peritonitis:  Awaiting cultures drawn prior to admission on 1/11. Currently reporting staphylococcus species identification and susceptibility to follow.  - Empirically treated with Vancomycin and Ceftazidime.   - Appreciate ID input - Continue empiric antibiotics  5. Hypertension with chronic kidney disease: 133/99. Currently holding home regimen of irbesartan and metoprolol.   - restart metoprolol.    LOS: 4 Anthony Hess 1/15/202311:12 AM

## 2021-03-08 NOTE — Progress Notes (Signed)
Pharmacy Antibiotic Note  Anthony Hess is a 66 y.o. male admitted on 03/04/2021 with Intra-abdominal infection, SBP, end-stage renal disease patient on peritoneal dialysis.  Pharmacy has been consulted for Vancomycin and Ceftazidime dosing.   Height: 5\' 3"  (160 cm) Weight: 55.8 kg (123 lb) IBW/kg (Calculated) : 56.9  Temp (24hrs), Avg:98.5 F (36.9 C), Min:98 F (36.7 C), Max:99.4 F (37.4 C)  Recent Labs  Lab 03/04/21 1958 03/04/21 2126 03/04/21 2255 03/05/21 0731 03/06/21 0533 03/07/21 0513 03/08/21 0539  WBC 16.9*  --   --  13.3* 13.6* 17.2* 12.6*  CREATININE 11.07*  --   --  11.32* 13.07* 11.15* 10.99*  LATICACIDVEN  --  1.7 1.5  --   --   --   --   VANCORANDOM  --   --   --   --   --   --  20     Estimated Creatinine Clearance: 5.4 mL/min (A) (by C-G formula based on SCr of 10.99 mg/dL (H)).    No Known Allergies  Antimicrobials this admission: 1/12 Azithromycin >>  1/11 Vancomycin >> 1/11 Ceftazidime >> x1  Microbiology results: 1/11 BCx: Pending 1/11 GI Panel: Pending  1/11 C. Diff:  Needs to be collected  Plan: Pt was given initial load of Vancomycin 1.5 grams IV per pt wt: 59 kg on 1/11.   -1/15: Vancomycin random level 1/15 @0539 = 20 mcg/ml (4 days post initial dose). Patient getting PD at night. Goal level 15-20 mcg/ml.  Will order Vancomycin 1 gm IV x 1 dose for 1/16 am.   F/u next level in 3-4 days  - Ceftazidime IVPD 1 gm IV q24hr added 03/06/21 per ID consult   Thank you for allowing pharmacy to be a part of this patients care.  Simrah Chatham A, PharmD 03/08/2021 11:56 AM,

## 2021-03-08 NOTE — Progress Notes (Signed)
Overall, patient had an uneventful night. Awakened minimally for vital signs. PD in progress. Call bell kept within reach.

## 2021-03-09 DIAGNOSIS — D631 Anemia in chronic kidney disease: Secondary | ICD-10-CM

## 2021-03-09 LAB — BASIC METABOLIC PANEL
Anion gap: 14 (ref 5–15)
BUN: 82 mg/dL — ABNORMAL HIGH (ref 8–23)
CO2: 23 mmol/L (ref 22–32)
Calcium: 7.1 mg/dL — ABNORMAL LOW (ref 8.9–10.3)
Chloride: 98 mmol/L (ref 98–111)
Creatinine, Ser: 9.6 mg/dL — ABNORMAL HIGH (ref 0.61–1.24)
GFR, Estimated: 6 mL/min — ABNORMAL LOW (ref >60)
Glucose, Bld: 116 mg/dL — ABNORMAL HIGH (ref 70–99)
Potassium: 3.4 mmol/L — ABNORMAL LOW (ref 3.5–5.1)
Sodium: 135 mmol/L (ref 135–145)

## 2021-03-09 LAB — CBC
HCT: 29.5 % — ABNORMAL LOW (ref 39.0–52.0)
Hemoglobin: 9.2 g/dL — ABNORMAL LOW (ref 13.0–17.0)
MCH: 26.4 pg (ref 26.0–34.0)
MCHC: 31.2 g/dL (ref 30.0–36.0)
MCV: 84.5 fL (ref 80.0–100.0)
Platelets: 520 10*3/uL — ABNORMAL HIGH (ref 150–400)
RBC: 3.49 MIL/uL — ABNORMAL LOW (ref 4.22–5.81)
RDW: 15 % (ref 11.5–15.5)
WBC: 12.6 10*3/uL — ABNORMAL HIGH (ref 4.0–10.5)
nRBC: 0 % (ref 0.0–0.2)

## 2021-03-09 LAB — CULTURE, BLOOD (ROUTINE X 2)
Culture: NO GROWTH
Culture: NO GROWTH
Special Requests: ADEQUATE

## 2021-03-09 LAB — MAGNESIUM: Magnesium: 1.9 mg/dL (ref 1.7–2.4)

## 2021-03-09 NOTE — Discharge Summary (Signed)
Physician Discharge Summary  Anthony Hess WLS:[id number] DOB: 1955-06-15 DOA: 03/04/2021  PCP: Pcp, No  Admit date: 03/04/2021 Discharge date: 03/09/2021  Admitted From: home  Disposition:  home   Recommendations for Outpatient Follow-up:  Follow up with PCP in 1-2 weeks  Home Health: no  Equipment/Devices:  Discharge Condition: stable  CODE STATUS: full  Diet recommendation: Heart Healthy / renal diet   Brief/Interim Summary: HPI was taken from Dr. Tobie Poet:  Anthony Hess is a 66 y.o. male with medical history significant for end-stage renal disease on peritoneal dialysis, history of tobacco use, hyperlipidemia, hypertension, history of medication noncompliance, anemia of chronic disease, who presents emergency department for chief concerns of abdominal pain.   He states that the abdominal pain has been ongoing for the 1.5 weeks, and it was getting really bad, prompting him to present to the emergency department for further evaluation. He states the pain is sharp, 7/10 at his peak and is consistently still a 7.  He states the pain is persistent. He endorses nausea and vomiting. He reports vomiting 2-3 times in the night for the last two weeks. He endorses diarrhea, watery, yellow. He has been having watery yellow stool for 3-4x day. This is not normal for him as he normally has one bowel movement per day.  He reports the diarrhea has been ongoing for the last 1.5 weeks.   He has been on antibiotics for the last two weeks antibiotic treatments during with his daily peritoneal dialysis treatments.    He also has a new cough that started about 1 week ago that is not productive.  He endorses generalized weakness.   He denies chest pain, shortness of breath, syncope, loss of consciousness. He is anuric at baseline.    Social history: He lives at home with his wife. He is a former tobacco user, quitting about two years ago. At his peak, he smoked 2 ppd. He started smoking at age 19. He denies  recreational drug use. He is retired and formerly worked in a [employer].   Vaccination history: he is vaccinated and has received 5 total doses of [facility] course from Dr. Jimmye Norman 1/12-1/16/23: Pt presented w/ abd pain likely secondary to SBP. Outpatient peritoneal cx grew stap species. Pt was receiving IV vanco, ceftazidime inpatient but will be changed to IV cefazolin 125mg /L in PD bags x 2 weeks as per nephro. Pt was able to ambulate and transfer independently so no therapy was needed. See previous progress/consult notes for more information.    Discharge Diagnoses:  Principal Problem:   Spontaneous bacterial peritonitis ([place]) Active Problems:   Cigarette smoker   History of bladder cancer   Hypertension   Bilateral hydronephrosis   Emphysema lung (HCC)   S/P CABG x 4   Atrial fibrillation (HCC)   ESRD (end stage renal disease) (HCC)   Anemia in ESRD (end-stage renal disease) (HCC)   Hyperlipidemia   Anemia in chronic kidney disease (CODE)   Leukocytosis   Hypocalcemia   Abdominal pain: etiology unclear, possible SBP. Procal 2.67. Blood cxs NGTD. Abxs change to IV cefazolin 125mg /L in PD bags x 2 weeks as per nephro. Peritoneal fluid cx growing stap species as per nephro. (Cx was obtained outpatient by nephro)   ESRD: on PD. Dialysis as per nephro   ACD: likely secondary to ESRD. H&H are labile   Thrombocytosis: labile, etiology unclear.   Hypocalcemia: labile, management as per nephro   Hypomagnesemia: WNL today  Hyponatremia: resolved  Cough: CXR was neg for any acute findings. Tessalon pearls prn. Encourage incentive spirometry    Diarrhea: w/ recent abx use. Resolved    Hx of CAD: s/p CABG. Continue on metoprolol, irbesartan, aspirin, & statin   HLD: continue on statin    HTN: continue on home dose of metoprolol, irbesartan   Hx of bladder cancer: s/p transurethral resection. Management per onco outpatient    Discharge Instructions  Discharge  Instructions     Diet - low sodium heart healthy   Complete by: As directed    W/ renal diet, 1200 mL fluid restriction per 24 hrs   Discharge instructions   Complete by: As directed    F/u w/ PCP in 1-2 weeks. F/u w/ nephro, Dr. Juleen China w/in 1 week. Will continue on IV abxs as per nephro recommendations. Dialysis clinic will take care of antibiotics. Continue with your PD.   Discharge wound care:   Complete by: As directed    Clean skin near exit site with chloraprep swab sticks.  Starting at catheter, use circular pattern around exit site, moving towards outer edges of area covered by dressing.  Apply gentamicin cream to site once daily.  Cover with dry dressing.   Increase activity slowly   Complete by: As directed       Allergies as of 03/09/2021   No Known Allergies      Medication List     STOP taking these medications    oxyCODONE 5 MG immediate release tablet Commonly known as: Oxy IR/ROXICODONE       TAKE these medications    acetaminophen 500 MG tablet Commonly known as: TYLENOL Take 2 tablets (1,000 mg total) by mouth every 6 (six) hours as needed for mild pain.   aspirin EC 81 MG tablet Take 81 mg by mouth daily.   atorvastatin 40 MG tablet Commonly known as: LIPITOR Take 1 tablet (40 mg total) by mouth daily.   benzonatate 200 MG capsule Commonly known as: TESSALON Take 200 mg by mouth 3 (three) times daily as needed.   calcitRIOL 0.5 MCG capsule Commonly known as: ROCALTROL Take 1 capsule (0.5 mcg total) by mouth daily.   calcium acetate 667 MG capsule Commonly known as: PHOSLO Take 2 capsules (1,334 mg total) by mouth 3 (three) times daily with meals. What changed: when to take this   calcium carbonate 1250 (500 Ca) MG tablet Commonly known as: OS-CAL - dosed in mg of elemental calcium Take 1 tablet (1,250 mg total) by mouth 2 (two) times daily between meals.   hydrALAZINE 10 MG tablet Commonly known as: APRESOLINE Take 10 mg by mouth 3  (three) times daily.   irbesartan 150 MG tablet Commonly known as: AVAPRO Take 150 mg by mouth daily.   metoprolol succinate 50 MG 24 hr tablet Commonly known as: TOPROL-XL Take 1 tablet (50 mg total) by mouth daily. Take with or immediately following a meal.   nystatin 500000 units Tabs tablet Commonly known as: MYCOSTATIN Take 1 tablet by mouth 3 (three) times daily.   ondansetron 8 MG disintegrating tablet Commonly known as: ZOFRAN-ODT Take 8 mg by mouth every 8 (eight) hours as needed.   Velphoro 500 MG chewable tablet Generic drug: sucroferric oxyhydroxide Chew 1,000 mg by mouth 3 (three) times daily.   Vitamin D (Ergocalciferol) 1.25 MG (50000 UNIT) Caps capsule Commonly known as: DRISDOL Take 1 capsule (50,000 Units total) by mouth every 7 (seven) days. What changed: when to take this  Discharge Care Instructions  (From admission, onward)           Start     Ordered   03/09/21 0000  Discharge wound care:       Comments: Clean skin near exit site with chloraprep swab sticks.  Starting at catheter, use circular pattern around exit site, moving towards outer edges of area covered by dressing.  Apply gentamicin cream to site once daily.  Cover with dry dressing.   03/09/21 1409            No Known Allergies  Consultations: Nephro ID   Procedures/Studies: CT ABDOMEN PELVIS WO CONTRAST  Result Date: 03/05/2021 CLINICAL DATA:  Abdominal pain, nonlocalized. Peritoneal dialysis patient. Cloudy dialysate. EXAM: CT ABDOMEN AND PELVIS WITHOUT CONTRAST TECHNIQUE: Multidetector CT imaging of the abdomen and pelvis was performed following the standard protocol without IV contrast. RADIATION DOSE REDUCTION: This exam was performed according to the departmental dose-optimization program which includes automated exposure control, adjustment of the mA and/or kV according to patient size and/or use of iterative reconstruction technique. COMPARISON:   04/03/2018.  12/05/2020. FINDINGS: Lower chest: Lung bases are clear. Hepatobiliary: Chronic benign low densities within the liver probably representing cysts and of angiomas are unchanged. Question small stones or sludge in the gallbladder. Pancreas: No significant finding. Spleen: Normal Adrenals/Urinary Tract: Adrenal glands are normal. Native kidneys show atrophic change and chronic hydroureteronephrosis right more than left. Bladder is unremarkable. Stomach/Bowel: Stomach and small intestine appear normal. No sign of bowel obstruction or inflammation. Colon is within normal limits. Moderate amount of stool and gas. Peritoneal dialysis catheter in place. Small amount of intraperitoneal fluid without evidence of loculated collection. Vascular/Lymphatic: Aortic atherosclerosis. Maximal diameter of the infrarenal abdominal aorta measures 3.2 cm. Reproductive: Normal Other: Small ventral hernias as seen previously contain less fluid. No sign of bowel extension or incarceration. Musculoskeletal: Scoliosis and degenerative change of the spine. Previous right femoral ORIF with hardware removal. IMPRESSION: Dialysis catheter remains in place. Small amount of intraperitoneal fluid without loculated collection. No specific complicating feature by CT. Stable appearance of chronic and presumably benign low densities within the liver. Question small amount of sludge or tiny stones dependent in the gallbladder. Aortic atherosclerosis. Maximal diameter of the aorta 3.2 cm. Recommend follow-up ultrasound every 3 years. This recommendation follows ACR consensus guidelines: White Paper of the ACR Incidental Findings Committee II on Vascular Findings. J Am Coll Radiol 2013; 10:789-794. Electronically Signed   By: Nelson Chimes M.D.   On: 03/05/2021 19:42   DG Chest Portable 1 View  Result Date: 03/04/2021 CLINICAL DATA:  Shortness of breath.  Weakness. EXAM: PORTABLE CHEST 1 VIEW COMPARISON:  Radiograph 12/27/2019 FINDINGS:  Post median sternotomy. Stable upper normal heart size. Unchanged mediastinal contours. No pulmonary edema, pleural effusion, or focal airspace disease. Scoliotic curvature of the spine. IMPRESSION: No acute chest findings. Electronically Signed   By: Keith Rake M.D.   On: 03/04/2021 20:50   (Echo, Carotid, EGD, Colonoscopy, ERCP)    Subjective: Pt denies any complaints    Discharge Exam: Vitals:   03/09/21 1015 03/09/21 1018  BP:  92/72  Pulse:    Resp: 15 18  Temp: 98 F (36.7 C)   SpO2:  90%   Vitals:   03/09/21 0500 03/09/21 0754 03/09/21 1015 03/09/21 1018  BP:  104/80  92/72  Pulse:  86    Resp:  18 15 18   Temp:  98.4 F (36.9 C) 98 F (36.7 C)   TempSrc:  Oral Oral   SpO2:  94%  90%  Weight: 57.6 kg  57.3 kg   Height:        General: Pt is alert, awake, not in acute distress Cardiovascular: S1/S2 +, no rubs, no gallops Respiratory: CTA bilaterally, no wheezing, no rhonchi Abdominal: Soft, NT, ND, bowel sounds + Extremities: no cyanosis    The results of significant diagnostics from this hospitalization (including imaging, microbiology, ancillary and laboratory) are listed below for reference.     Microbiology: Recent Results (from the past 240 hour(s))  Blood culture (routine x 2)     Status: None   Collection Time: 03/04/21  9:26 PM   Specimen: BLOOD  Result Value Ref Range Status   Specimen Description BLOOD LEFT FOREARM  Final   Special Requests   Final    BOTTLES DRAWN AEROBIC AND ANAEROBIC Blood Culture adequate volume   Culture   Final    NO GROWTH 5 DAYS Performed at [facility], [address] [place], [place] [address]    Report Status 03/09/2021 FINAL  Final  Resp Panel by RT-PCR (Flu A&B, Covid) Nasopharyngeal Swab     Status: None   Collection Time: 03/04/21  9:26 PM   Specimen: Nasopharyngeal Swab; Nasopharyngeal(NP) swabs in vial transport medium  Result Value Ref Range Status   SARS Coronavirus 2 by RT PCR NEGATIVE  NEGATIVE Final    Comment: (NOTE) SARS-CoV-2 target nucleic acids are NOT DETECTED.  The SARS-CoV-2 RNA is generally detectable in upper respiratory specimens during the acute phase of infection. The lowest concentration of SARS-CoV-2 viral copies this assay can detect is 138 copies/mL. A negative result does not preclude SARS-Cov-2 infection and should not be used as the sole basis for treatment or other patient management decisions. A negative result may occur with  improper specimen collection/handling, submission of specimen other than nasopharyngeal swab, presence of viral mutation(s) within the areas targeted by this assay, and inadequate number of viral copies(<138 copies/mL). A negative result must be combined with clinical observations, patient history, and epidemiological information. The expected result is Negative.  Fact Sheet for Patients:  EntrepreneurPulse.com.au  Fact Sheet for Healthcare Providers:  IncredibleEmployment.be  This test is no t yet approved or cleared by the [place] FDA and  has been authorized for detection and/or diagnosis of SARS-CoV-2 by FDA under an Emergency Use Authorization (EUA). This EUA will remain  in effect (meaning this test can be used) for the duration of the COVID-19 declaration under Section 564(b)(1) of the Act, 21 U.S.C.section 360bbb-3(b)(1), unless the authorization is terminated  or revoked sooner.       Influenza A by PCR NEGATIVE NEGATIVE Final   Influenza B by PCR NEGATIVE NEGATIVE Final    Comment: (NOTE) The Xpert Xpress SARS-CoV-2/FLU/RSV plus assay is intended as an aid in the diagnosis of influenza from Nasopharyngeal swab specimens and should not be used as a sole basis for treatment. Nasal washings and aspirates are unacceptable for Xpert Xpress SARS-CoV-2/FLU/RSV testing.  Fact Sheet for Patients: EntrepreneurPulse.com.au  Fact Sheet for Healthcare  Providers: IncredibleEmployment.be  This test is not yet approved or cleared by the [place] FDA and has been authorized for detection and/or diagnosis of SARS-CoV-2 by FDA under an Emergency Use Authorization (EUA). This EUA will remain in effect (meaning this test can be used) for the duration of the COVID-19 declaration under Section 564(b)(1) of the Act, 21 U.S.C. section 360bbb-3(b)(1), unless the authorization is terminated or revoked.  Performed at  [facility], [address] [place], [place] [address]   Blood culture (routine x 2)     Status: None   Collection Time: 03/04/21  9:46 PM   Specimen: BLOOD  Result Value Ref Range Status   Specimen Description BLOOD RIGHT HAND  Final   Special Requests   Final    BOTTLES DRAWN AEROBIC AND ANAEROBIC Blood Culture results may not be optimal due to an inadequate volume of blood received in culture bottles   Culture   Final    NO GROWTH 5 DAYS Performed at [facility], [address] [place], [place] [address]    Report Status 03/09/2021 FINAL  Final     Labs: BNP (last 3 results) No results for input(s): BNP in the last 8760 hours. Basic Metabolic Panel: Recent Labs  Lab 03/04/21 2146 03/05/21 0731 03/06/21 0533 03/07/21 0513 03/08/21 0539 03/09/21 0539  NA  --  134* 133* 136 137 135  K  --  3.4* 3.6 3.5 3.8 3.4*  CL  --  97* 95* 99 101 98  CO2  --  20* 19* 20* 17* 23  GLUCOSE  --  97 138* 117* 88 116*  BUN  --  75* 95* 90* 94* 82*  CREATININE  --  11.32* 13.07* 11.15* 10.99* 9.60*  CALCIUM  --  5.4* 5.9* 7.0* 6.9* 7.1*  MG 1.7 1.5* 2.3 2.1 2.1 1.9  PHOS 10.7* 11.2*  --   --   --   --    Liver Function Tests: Recent Labs  Lab 03/04/21 2146 03/06/21 0533  AST 23  --   ALT 24  --   ALKPHOS 49  --   BILITOT 0.7  --   PROT 5.3*  --   ALBUMIN 1.8* 1.5*   Recent Labs  Lab 03/04/21 2146  LIPASE 30   No results for input(s): AMMONIA in the last 168  hours. CBC: Recent Labs  Lab 03/05/21 0731 03/06/21 0533 03/07/21 0513 03/08/21 0539 03/09/21 0539  WBC 13.3* 13.6* 17.2* 12.6* 12.6*  NEUTROABS 11.3*  --   --   --   --   HGB 9.8* 10.0* 11.1* 9.4* 9.2*  HCT 31.2* 32.1* 35.3* 29.5* 29.5*  MCV 86.9 87.9 85.3 85.8 84.5  PLT 544* 503* 567* 491* 520*   Cardiac Enzymes: No results for input(s): CKTOTAL, CKMB, CKMBINDEX, TROPONINI in the last 168 hours. BNP: Invalid input(s): POCBNP CBG: No results for input(s): GLUCAP in the last 168 hours. D-Dimer No results for input(s): DDIMER in the last 72 hours. Hgb A1c No results for input(s): HGBA1C in the last 72 hours. Lipid Profile No results for input(s): CHOL, HDL, LDLCALC, TRIG, CHOLHDL, LDLDIRECT in the last 72 hours. Thyroid function studies No results for input(s): TSH, T4TOTAL, T3FREE, THYROIDAB in the last 72 hours.  Invalid input(s): FREET3 Anemia work up No results for input(s): VITAMINB12, FOLATE, FERRITIN, TIBC, IRON, RETICCTPCT in the last 72 hours. Urinalysis    Component Value Date/Time   COLORURINE STRAW (A) 12/27/2019 1756   APPEARANCEUR CLEAR (A) 12/27/2019 1756   APPEARANCEUR Cloudy (A) 05/08/2019 1448   LABSPEC 1.010 12/27/2019 1756   PHURINE 5.0 12/27/2019 1756   GLUCOSEU NEGATIVE 12/27/2019 1756   HGBUR NEGATIVE 12/27/2019 1756   BILIRUBINUR NEGATIVE 12/27/2019 1756   BILIRUBINUR Negative 05/08/2019 [place] 12/27/2019 1756   PROTEINUR >=300 (A) 12/27/2019 1756   NITRITE NEGATIVE 12/27/2019 1756   LEUKOCYTESUR NEGATIVE 12/27/2019 1756   Sepsis Labs Invalid input(s):  PROCALCITONIN,  WBC,  LACTICIDVEN Microbiology Recent Results (from the past 240 hour(s))  Blood culture (routine x 2)     Status: None   Collection Time: 03/04/21  9:26 PM   Specimen: BLOOD  Result Value Ref Range Status   Specimen Description BLOOD LEFT FOREARM  Final   Special Requests   Final    BOTTLES DRAWN AEROBIC AND ANAEROBIC Blood Culture adequate volume    Culture   Final    NO GROWTH 5 DAYS Performed at [facility], [address] [place], [place] [address]    Report Status 03/09/2021 FINAL  Final  Resp Panel by RT-PCR (Flu A&B, Covid) Nasopharyngeal Swab     Status: None   Collection Time: 03/04/21  9:26 PM   Specimen: Nasopharyngeal Swab; Nasopharyngeal(NP) swabs in vial transport medium  Result Value Ref Range Status   SARS Coronavirus 2 by RT PCR NEGATIVE NEGATIVE Final    Comment: (NOTE) SARS-CoV-2 target nucleic acids are NOT DETECTED.  The SARS-CoV-2 RNA is generally detectable in upper respiratory specimens during the acute phase of infection. The lowest concentration of SARS-CoV-2 viral copies this assay can detect is 138 copies/mL. A negative result does not preclude SARS-Cov-2 infection and should not be used as the sole basis for treatment or other patient management decisions. A negative result may occur with  improper specimen collection/handling, submission of specimen other than nasopharyngeal swab, presence of viral mutation(s) within the areas targeted by this assay, and inadequate number of viral copies(<138 copies/mL). A negative result must be combined with clinical observations, patient history, and epidemiological information. The expected result is Negative.  Fact Sheet for Patients:  EntrepreneurPulse.com.au  Fact Sheet for Healthcare Providers:  IncredibleEmployment.be  This test is no t yet approved or cleared by the [place] FDA and  has been authorized for detection and/or diagnosis of SARS-CoV-2 by FDA under an Emergency Use Authorization (EUA). This EUA will remain  in effect (meaning this test can be used) for the duration of the COVID-19 declaration under Section 564(b)(1) of the Act, 21 U.S.C.section 360bbb-3(b)(1), unless the authorization is terminated  or revoked sooner.       Influenza A by PCR NEGATIVE NEGATIVE Final   Influenza B by  PCR NEGATIVE NEGATIVE Final    Comment: (NOTE) The Xpert Xpress SARS-CoV-2/FLU/RSV plus assay is intended as an aid in the diagnosis of influenza from Nasopharyngeal swab specimens and should not be used as a sole basis for treatment. Nasal washings and aspirates are unacceptable for Xpert Xpress SARS-CoV-2/FLU/RSV testing.  Fact Sheet for Patients: EntrepreneurPulse.com.au  Fact Sheet for Healthcare Providers: IncredibleEmployment.be  This test is not yet approved or cleared by the [place] FDA and has been authorized for detection and/or diagnosis of SARS-CoV-2 by FDA under an Emergency Use Authorization (EUA). This EUA will remain in effect (meaning this test can be used) for the duration of the COVID-19 declaration under Section 564(b)(1) of the Act, 21 U.S.C. section 360bbb-3(b)(1), unless the authorization is terminated or revoked.  Performed at [facility], [address] [place], [place] [address]   Blood culture (routine x 2)     Status: None   Collection Time: 03/04/21  9:46 PM   Specimen: BLOOD  Result Value Ref Range Status   Specimen Description BLOOD RIGHT HAND  Final   Special Requests   Final    BOTTLES DRAWN AEROBIC AND ANAEROBIC Blood Culture results may not be optimal due to an inadequate volume of blood received  in culture bottles   Culture   Final    NO GROWTH 5 DAYS Performed at [facility], [address] [place], [place] [address]    Report Status 03/09/2021 FINAL  Final     Time coordinating discharge: Over 30 minutes  SIGNED:   Wyvonnia Dusky, MD  [facility] 03/09/2021, 2:10 PM Pager   If 7PM-7AM, please contact night-coverage

## 2021-03-09 NOTE — Progress Notes (Signed)
[facility]  ROUNDING NOTE   Subjective:   Patient resting comfortably Continues to complain of intermittent abdominal tenderness in left upper quadrant Completed successful PD treatment overnight  Staph species seen on culture, awaiting sensitivity  Objective:  Vital signs in last 24 hours:  Temp:  [98 F (36.7 C)-98.4 F (36.9 C)] 98 F (36.7 C) (01/16 1015) Pulse Rate:  [86-97] 86 (01/16 0754) Resp:  [15-23] 18 (01/16 1018) BP: (92-148)/(72-110) 92/72 (01/16 1018) SpO2:  [90 %-99 %] 90 % (01/16 1018) Weight:  [56.9 kg-57.6 kg] 57.3 kg (01/16 1015)  Weight change: 0.3 kg Filed Weights   03/08/21 2045 03/09/21 0500 03/09/21 1015  Weight: 56.9 kg 57.6 kg 57.3 kg    Intake/Output: I/O last 3 completed shifts: In: 60 [P.O.:1200; Other:8000; IV Piggyback:100] Out: 7510 [CHENI:7782]   Intake/Output this shift:  Total I/O In: 8000 [Other:8000] Out: 7764 [Other:7764]  Physical Exam: General: NAD, laying in bed  Head: Normocephalic, atraumatic. Moist oral mucosal membranes  Eyes: Anicteric  Lungs:  Clear to auscultation, normal effort  Heart: Regular rate and rhythm  Abdomen:  +tender to palpation  Extremities:  no peripheral edema.  Neurologic: Nonfocal, moving all four extremities  Skin: No lesions  Access: PD catheter    Basic Metabolic Panel: Recent Labs  Lab 03/04/21 2146 03/05/21 0731 03/06/21 0533 03/07/21 0513 03/08/21 0539 03/09/21 0539  NA  --  134* 133* 136 137 135  K  --  3.4* 3.6 3.5 3.8 3.4*  CL  --  97* 95* 99 101 98  CO2  --  20* 19* 20* 17* 23  GLUCOSE  --  97 138* 117* 88 116*  BUN  --  75* 95* 90* 94* 82*  CREATININE  --  11.32* 13.07* 11.15* 10.99* 9.60*  CALCIUM  --  5.4* 5.9* 7.0* 6.9* 7.1*  MG 1.7 1.5* 2.3 2.1 2.1 1.9  PHOS 10.7* 11.2*  --   --   --   --      Liver Function Tests: Recent Labs  Lab 03/04/21 2146 03/06/21 0533  AST 23  --   ALT 24  --   ALKPHOS 49  --   BILITOT 0.7  --   PROT 5.3*  --    ALBUMIN 1.8* 1.5*    Recent Labs  Lab 03/04/21 2146  LIPASE 30    No results for input(s): AMMONIA in the last 168 hours.  CBC: Recent Labs  Lab 03/05/21 0731 03/06/21 0533 03/07/21 0513 03/08/21 0539 03/09/21 0539  WBC 13.3* 13.6* 17.2* 12.6* 12.6*  NEUTROABS 11.3*  --   --   --   --   HGB 9.8* 10.0* 11.1* 9.4* 9.2*  HCT 31.2* 32.1* 35.3* 29.5* 29.5*  MCV 86.9 87.9 85.3 85.8 84.5  PLT 544* 503* 567* 491* 520*     Cardiac Enzymes: No results for input(s): CKTOTAL, CKMB, CKMBINDEX, TROPONINI in the last 168 hours.  BNP: Invalid input(s): POCBNP  CBG: No results for input(s): GLUCAP in the last 168 hours.  Microbiology: Results for orders placed or performed during the hospital encounter of 03/04/21  Blood culture (routine x 2)     Status: None   Collection Time: 03/04/21  9:26 PM   Specimen: BLOOD  Result Value Ref Range Status   Specimen Description BLOOD LEFT FOREARM  Final   Special Requests   Final    BOTTLES DRAWN AEROBIC AND ANAEROBIC Blood Culture adequate volume   Culture   Final    NO GROWTH  5 DAYS Performed at [facility], [address] [place], [place] [address]    Report Status 03/09/2021 FINAL  Final  Resp Panel by RT-PCR (Flu A&B, Covid) Nasopharyngeal Swab     Status: None   Collection Time: 03/04/21  9:26 PM   Specimen: Nasopharyngeal Swab; Nasopharyngeal(NP) swabs in vial transport medium  Result Value Ref Range Status   SARS Coronavirus 2 by RT PCR NEGATIVE NEGATIVE Final    Comment: (NOTE) SARS-CoV-2 target nucleic acids are NOT DETECTED.  The SARS-CoV-2 RNA is generally detectable in upper respiratory specimens during the acute phase of infection. The lowest concentration of SARS-CoV-2 viral copies this assay can detect is 138 copies/mL. A negative result does not preclude SARS-Cov-2 infection and should not be used as the sole basis for treatment or other patient management decisions. A negative result may occur with   improper specimen collection/handling, submission of specimen other than nasopharyngeal swab, presence of viral mutation(s) within the areas targeted by this assay, and inadequate number of viral copies(<138 copies/mL). A negative result must be combined with clinical observations, patient history, and epidemiological information. The expected result is Negative.  Fact Sheet for Patients:  EntrepreneurPulse.com.au  Fact Sheet for Healthcare Providers:  IncredibleEmployment.be  This test is no t yet approved or cleared by the [place] FDA and  has been authorized for detection and/or diagnosis of SARS-CoV-2 by FDA under an Emergency Use Authorization (EUA). This EUA will remain  in effect (meaning this test can be used) for the duration of the COVID-19 declaration under Section 564(b)(1) of the Act, 21 U.S.C.section 360bbb-3(b)(1), unless the authorization is terminated  or revoked sooner.       Influenza A by PCR NEGATIVE NEGATIVE Final   Influenza B by PCR NEGATIVE NEGATIVE Final    Comment: (NOTE) The Xpert Xpress SARS-CoV-2/FLU/RSV plus assay is intended as an aid in the diagnosis of influenza from Nasopharyngeal swab specimens and should not be used as a sole basis for treatment. Nasal washings and aspirates are unacceptable for Xpert Xpress SARS-CoV-2/FLU/RSV testing.  Fact Sheet for Patients: EntrepreneurPulse.com.au  Fact Sheet for Healthcare Providers: IncredibleEmployment.be  This test is not yet approved or cleared by the [place] FDA and has been authorized for detection and/or diagnosis of SARS-CoV-2 by FDA under an Emergency Use Authorization (EUA). This EUA will remain in effect (meaning this test can be used) for the duration of the COVID-19 declaration under Section 564(b)(1) of the Act, 21 U.S.C. section 360bbb-3(b)(1), unless the authorization is terminated  or revoked.  Performed at [facility], [address] [place], [place] [address]   Blood culture (routine x 2)     Status: None   Collection Time: 03/04/21  9:46 PM   Specimen: BLOOD  Result Value Ref Range Status   Specimen Description BLOOD RIGHT HAND  Final   Special Requests   Final    BOTTLES DRAWN AEROBIC AND ANAEROBIC Blood Culture results may not be optimal due to an inadequate volume of blood received in culture bottles   Culture   Final    NO GROWTH 5 DAYS Performed at [facility], [address], [place], [place] [address]    Report Status 03/09/2021 FINAL  Final    Coagulation Studies: No results for input(s): LABPROT, INR in the last 72 hours.   Urinalysis: No results for input(s): COLORURINE, LABSPEC, PHURINE, GLUCOSEU, HGBUR, BILIRUBINUR, KETONESUR, PROTEINUR, UROBILINOGEN, NITRITE, LEUKOCYTESUR in the last 72 hours.  Invalid input(s): APPERANCEUR  Imaging: No results found.   Medications:    sodium chloride 10 mL/hr at 03/09/21 0422   cefTAZidime (FORTAZ)  IV 1 g (03/08/21 2235)   dialysis solution 1.5% low-MG/low-CA Stopped (03/05/21 1429)   sodium chloride      aspirin EC  81 mg Oral Daily   atorvastatin  40 mg Oral QHS   calcitRIOL  0.5 mcg Oral Daily   calcium acetate  2,001 mg Oral BID WC   Chlorhexidine Gluconate Cloth  6 each Topical Daily   gentamicin cream  1 application Topical Daily   heparin  5,000 Units Subcutaneous Q8H   irbesartan  150 mg Oral Daily   metoprolol succinate  50 mg Oral Daily   vancomycin variable dose per unstable renal function (pharmacist dosing)   Does not apply See admin instructions   sodium chloride, acetaminophen **OR** acetaminophen, benzonatate, heparin, ipratropium-albuterol, morphine injection, ondansetron **OR** ondansetron (ZOFRAN) IV, oxyCODONE-acetaminophen  Assessment/ Plan:  [name] is a 66 y.o. white male with end stage renal disease on peritoneal dialysis,  hypertension, hyperlipidemia, coronary artery disease status post CABG, history of bladder cancer, COPD with ongoing tobacco use, who is admitted to [facility] on 03/04/2021 for Spontaneous bacterial peritonitis ([place]) [K65.2] SBP (spontaneous bacterial peritonitis) ([place]) [K65.2]  [facility] [place] admitted for Spontaneous bacterial peritonitis ([place]) [K65.2] SBP (spontaneous bacterial peritonitis) ([place]) [K65.2]   [facility] [address] 55kg.  [facility] 8 hours 4 exchanges 2 liter fill  End stage renal disease on peritoneal dialysis. Continue nightly peritoneal dialysis.  We will continue nightly treatments during this admission.  2. Anemia of chronic kidney disease: hemoglobin 9.2, Mircera as outpatient.  Hemoglobin remains at target at this time.  3. Secondary Hyperparathyroidism: with hyperphosphatemia and hypocalcemia.    - Continue calcitriol  - calcium acetate with meals, recently increased to 3 tabs per meal  4. Peritonitis:  Awaiting cultures drawn prior to admission on 1/11. Currently reporting staphylococcus species identification and susceptibility to follow.  - Empirically treated with Vancomycin and Ceftazidime.   - Appreciate ID input - Continue empiric antibiotics -Awaiting sensitivity to determine discharge needs.  5. Hypertension with chronic kidney disease: 133/99. Home regimen of irbesartan and metoprolol.   -Both medications have been restarted.  Current BP 92/80   LOS: 5 [place] Basista 1/16/202311:12 AM

## 2021-03-10 ENCOUNTER — Ambulatory Visit: Admission: RE | Admit: 2021-03-10 | Payer: Medicare Other | Source: Ambulatory Visit

## 2021-03-14 ENCOUNTER — Emergency Department: Payer: Medicare Other

## 2021-03-14 ENCOUNTER — Other Ambulatory Visit: Payer: Self-pay

## 2021-03-14 ENCOUNTER — Inpatient Hospital Stay
Admission: EM | Admit: 2021-03-14 | Discharge: 2021-03-24 | DRG: 853 | Disposition: A | Discharge status: Home / Self Care | Payer: Medicare Other | Attending: Internal Medicine | Admitting: Internal Medicine

## 2021-03-14 DIAGNOSIS — R1084 Generalized abdominal pain: Secondary | ICD-10-CM

## 2021-03-14 DIAGNOSIS — Z87442 Personal history of urinary calculi: Secondary | ICD-10-CM

## 2021-03-14 DIAGNOSIS — Y812 Prosthetic and other implants, materials and accessory general- and plastic-surgery devices associated with adverse incidents: Secondary | ICD-10-CM | POA: Diagnosis present

## 2021-03-14 DIAGNOSIS — Z79899 Other long term (current) drug therapy: Secondary | ICD-10-CM

## 2021-03-14 DIAGNOSIS — I252 Old myocardial infarction: Secondary | ICD-10-CM

## 2021-03-14 DIAGNOSIS — I2510 Atherosclerotic heart disease of native coronary artery without angina pectoris: Secondary | ICD-10-CM | POA: Diagnosis present

## 2021-03-14 DIAGNOSIS — N1330 Unspecified hydronephrosis: Secondary | ICD-10-CM | POA: Diagnosis present

## 2021-03-14 DIAGNOSIS — T85611A Breakdown (mechanical) of intraperitoneal dialysis catheter, initial encounter: Secondary | ICD-10-CM | POA: Diagnosis not present

## 2021-03-14 DIAGNOSIS — K828 Other specified diseases of gallbladder: Secondary | ICD-10-CM | POA: Diagnosis present

## 2021-03-14 DIAGNOSIS — T80211A Bloodstream infection due to central venous catheter, initial encounter: Secondary | ICD-10-CM | POA: Diagnosis present

## 2021-03-14 DIAGNOSIS — I132 Hypertensive heart and chronic kidney disease with heart failure and with stage 5 chronic kidney disease, or end stage renal disease: Secondary | ICD-10-CM | POA: Diagnosis present

## 2021-03-14 DIAGNOSIS — I4891 Unspecified atrial fibrillation: Secondary | ICD-10-CM | POA: Diagnosis present

## 2021-03-14 DIAGNOSIS — K652 Spontaneous bacterial peritonitis: Secondary | ICD-10-CM | POA: Diagnosis present

## 2021-03-14 DIAGNOSIS — E785 Hyperlipidemia, unspecified: Secondary | ICD-10-CM | POA: Diagnosis present

## 2021-03-14 DIAGNOSIS — Z951 Presence of aortocoronary bypass graft: Secondary | ICD-10-CM

## 2021-03-14 DIAGNOSIS — I714 Abdominal aortic aneurysm, without rupture, unspecified: Secondary | ICD-10-CM | POA: Diagnosis present

## 2021-03-14 DIAGNOSIS — I509 Heart failure, unspecified: Secondary | ICD-10-CM | POA: Diagnosis present

## 2021-03-14 DIAGNOSIS — D638 Anemia in other chronic diseases classified elsewhere: Secondary | ICD-10-CM | POA: Diagnosis not present

## 2021-03-14 DIAGNOSIS — E782 Mixed hyperlipidemia: Secondary | ICD-10-CM | POA: Diagnosis present

## 2021-03-14 DIAGNOSIS — R109 Unspecified abdominal pain: Secondary | ICD-10-CM | POA: Diagnosis present

## 2021-03-14 DIAGNOSIS — N185 Chronic kidney disease, stage 5: Secondary | ICD-10-CM | POA: Diagnosis not present

## 2021-03-14 DIAGNOSIS — Z8551 Personal history of malignant neoplasm of bladder: Secondary | ICD-10-CM

## 2021-03-14 DIAGNOSIS — T8571XD Infection and inflammatory reaction due to peritoneal dialysis catheter, subsequent encounter: Secondary | ICD-10-CM | POA: Diagnosis not present

## 2021-03-14 DIAGNOSIS — K659 Peritonitis, unspecified: Secondary | ICD-10-CM | POA: Diagnosis not present

## 2021-03-14 DIAGNOSIS — J449 Chronic obstructive pulmonary disease, unspecified: Secondary | ICD-10-CM | POA: Diagnosis present

## 2021-03-14 DIAGNOSIS — A419 Sepsis, unspecified organism: Principal | ICD-10-CM | POA: Diagnosis present

## 2021-03-14 DIAGNOSIS — Z7982 Long term (current) use of aspirin: Secondary | ICD-10-CM

## 2021-03-14 DIAGNOSIS — Y838 Other surgical procedures as the cause of abnormal reaction of the patient, or of later complication, without mention of misadventure at the time of the procedure: Secondary | ICD-10-CM | POA: Diagnosis present

## 2021-03-14 DIAGNOSIS — D631 Anemia in chronic kidney disease: Secondary | ICD-10-CM | POA: Diagnosis present

## 2021-03-14 DIAGNOSIS — Z20822 Contact with and (suspected) exposure to covid-19: Secondary | ICD-10-CM | POA: Diagnosis present

## 2021-03-14 DIAGNOSIS — N189 Chronic kidney disease, unspecified: Secondary | ICD-10-CM | POA: Diagnosis present

## 2021-03-14 DIAGNOSIS — Y848 Other medical procedures as the cause of abnormal reaction of the patient, or of later complication, without mention of misadventure at the time of the procedure: Secondary | ICD-10-CM | POA: Diagnosis present

## 2021-03-14 DIAGNOSIS — K650 Generalized (acute) peritonitis: Secondary | ICD-10-CM | POA: Diagnosis not present

## 2021-03-14 DIAGNOSIS — E876 Hypokalemia: Secondary | ICD-10-CM | POA: Diagnosis not present

## 2021-03-14 DIAGNOSIS — Z87891 Personal history of nicotine dependence: Secondary | ICD-10-CM

## 2021-03-14 DIAGNOSIS — N186 End stage renal disease: Secondary | ICD-10-CM | POA: Diagnosis present

## 2021-03-14 DIAGNOSIS — Z992 Dependence on renal dialysis: Secondary | ICD-10-CM | POA: Diagnosis not present

## 2021-03-14 DIAGNOSIS — I10 Essential (primary) hypertension: Secondary | ICD-10-CM | POA: Diagnosis present

## 2021-03-14 DIAGNOSIS — I482 Chronic atrial fibrillation, unspecified: Secondary | ICD-10-CM | POA: Diagnosis present

## 2021-03-14 HISTORY — DX: Sepsis, unspecified organism: A41.9

## 2021-03-14 LAB — BODY FLUID CELL COUNT WITH DIFFERENTIAL
Eos, Fluid: 0 %
Lymphs, Fluid: 8 %
Monocyte-Macrophage-Serous Fluid: 13 %
Neutrophil Count, Fluid: 78 %
Other Cells, Fluid: 0 %
Total Nucleated Cell Count, Fluid: 2172 cu mm

## 2021-03-14 LAB — CBC
HCT: 32.7 % — ABNORMAL LOW (ref 39.0–52.0)
Hemoglobin: 10 g/dL — ABNORMAL LOW (ref 13.0–17.0)
MCH: 26.8 pg (ref 26.0–34.0)
MCHC: 30.6 g/dL (ref 30.0–36.0)
MCV: 87.7 fL (ref 80.0–100.0)
Platelets: 573 10*3/uL — ABNORMAL HIGH (ref 150–400)
RBC: 3.73 MIL/uL — ABNORMAL LOW (ref 4.22–5.81)
RDW: 15.5 % (ref 11.5–15.5)
WBC: 15.7 10*3/uL — ABNORMAL HIGH (ref 4.0–10.5)
nRBC: 0 % (ref 0.0–0.2)

## 2021-03-14 LAB — COMPREHENSIVE METABOLIC PANEL
ALT: <5 U/L (ref 0–44)
AST: 23 U/L (ref 15–41)
Albumin: 1.6 g/dL — ABNORMAL LOW (ref 3.5–5.0)
Alkaline Phosphatase: 56 U/L (ref 38–126)
Anion gap: 13 (ref 5–15)
BUN: 71 mg/dL — ABNORMAL HIGH (ref 8–23)
CO2: 28 mmol/L (ref 22–32)
Calcium: 7 mg/dL — ABNORMAL LOW (ref 8.9–10.3)
Chloride: 97 mmol/L — ABNORMAL LOW (ref 98–111)
Creatinine, Ser: 8.74 mg/dL — ABNORMAL HIGH (ref 0.61–1.24)
GFR, Estimated: 6 mL/min — ABNORMAL LOW (ref >60)
Glucose, Bld: 106 mg/dL — ABNORMAL HIGH (ref 70–99)
Potassium: 3.4 mmol/L — ABNORMAL LOW (ref 3.5–5.1)
Sodium: 138 mmol/L (ref 135–145)
Total Bilirubin: 0.1 mg/dL — ABNORMAL LOW (ref 0.3–1.2)
Total Protein: 5.2 g/dL — ABNORMAL LOW (ref 6.5–8.1)

## 2021-03-14 LAB — CREATININE, SERUM
Creatinine, Ser: 9.4 mg/dL — ABNORMAL HIGH (ref 0.61–1.24)
GFR, Estimated: 6 mL/min — ABNORMAL LOW (ref >60)

## 2021-03-14 LAB — PROCALCITONIN: Procalcitonin: 1.35 ng/mL

## 2021-03-14 LAB — APTT: aPTT: 40 seconds — ABNORMAL HIGH (ref 24–36)

## 2021-03-14 LAB — RESP PANEL BY RT-PCR (FLU A&B, COVID) ARPGX2
Influenza A by PCR: NEGATIVE
Influenza B by PCR: NEGATIVE
SARS Coronavirus 2 by RT PCR: NEGATIVE

## 2021-03-14 LAB — CBC WITH DIFFERENTIAL/PLATELET
Abs Immature Granulocytes: 0.13 10*3/uL — ABNORMAL HIGH (ref 0.00–0.07)
Basophils Absolute: 0 10*3/uL (ref 0.0–0.1)
Basophils Relative: 0 %
Eosinophils Absolute: 0.2 10*3/uL (ref 0.0–0.5)
Eosinophils Relative: 1 %
HCT: 32.3 % — ABNORMAL LOW (ref 39.0–52.0)
Hemoglobin: 10.2 g/dL — ABNORMAL LOW (ref 13.0–17.0)
Immature Granulocytes: 1 %
Lymphocytes Relative: 5 %
Lymphs Abs: 0.9 10*3/uL (ref 0.7–4.0)
MCH: 27.3 pg (ref 26.0–34.0)
MCHC: 31.6 g/dL (ref 30.0–36.0)
MCV: 86.6 fL (ref 80.0–100.0)
Monocytes Absolute: 1.4 10*3/uL — ABNORMAL HIGH (ref 0.1–1.0)
Monocytes Relative: 8 %
Neutro Abs: 13.9 10*3/uL — ABNORMAL HIGH (ref 1.7–7.7)
Neutrophils Relative %: 85 %
Platelets: 590 10*3/uL — ABNORMAL HIGH (ref 150–400)
RBC: 3.73 MIL/uL — ABNORMAL LOW (ref 4.22–5.81)
RDW: 15.5 % (ref 11.5–15.5)
WBC: 16.4 10*3/uL — ABNORMAL HIGH (ref 4.0–10.5)
nRBC: 0 % (ref 0.0–0.2)

## 2021-03-14 LAB — PROTIME-INR
INR: 1.7 — ABNORMAL HIGH (ref 0.8–1.2)
Prothrombin Time: 20.3 seconds — ABNORMAL HIGH (ref 11.4–15.2)

## 2021-03-14 LAB — LACTIC ACID, PLASMA
Lactic Acid, Venous: 2 mmol/L (ref 0.5–1.9)
Lactic Acid, Venous: 1.3 mmol/L (ref 0.5–1.9)

## 2021-03-14 LAB — LIPASE, BLOOD: Lipase: 25 U/L (ref 11–51)

## 2021-03-14 MED ORDER — SUCROFERRIC OXYHYDROXIDE 500 MG PO CHEW
1000.0000 mg | CHEWABLE_TABLET | Freq: Three times a day (TID) | ORAL | Status: DC
  Administered 2021-03-14 – 2021-03-24 (×26): 1000 mg via ORAL
  Filled 2021-03-14 (×32): qty 2

## 2021-03-14 MED ORDER — LACTATED RINGERS IV SOLN: INTRAVENOUS | Status: DC

## 2021-03-14 MED ORDER — METRONIDAZOLE 500 MG/100ML IV SOLN: 500.0000 mg | Freq: Once | INTRAVENOUS | Status: DC

## 2021-03-14 MED ORDER — VITAMIN D (ERGOCALCIFEROL) 1.25 MG (50000 UNIT) PO CAPS
50000.0000 [IU] | ORAL_CAPSULE | ORAL | Status: DC
  Administered 2021-03-14 – 2021-03-21 (×2): 50000 [IU] via ORAL
  Filled 2021-03-14 (×2): qty 1

## 2021-03-14 MED ORDER — CALCIUM CARBONATE 1250 (500 CA) MG PO TABS
1250.0000 mg | ORAL_TABLET | Freq: Two times a day (BID) | ORAL | Status: DC
  Administered 2021-03-15 – 2021-03-24 (×18): 1250 mg via ORAL
  Filled 2021-03-14 (×20): qty 1

## 2021-03-14 MED ORDER — CALCITRIOL 0.25 MCG PO CAPS
0.5000 ug | ORAL_CAPSULE | Freq: Every day | ORAL | Status: DC
  Administered 2021-03-15 – 2021-03-24 (×10): 0.5 ug via ORAL
  Filled 2021-03-14 (×10): qty 2

## 2021-03-14 MED ORDER — SODIUM CHLORIDE 0.9 % IV SOLN
1.0000 g | Freq: Once | INTRAVENOUS | Status: AC
  Administered 2021-03-14: 1 g via INTRAVENOUS
  Filled 2021-03-14: qty 1

## 2021-03-14 MED ORDER — ONDANSETRON HCL 4 MG PO TABS: 4.0000 mg | ORAL_TABLET | Freq: Four times a day (QID) | ORAL | Status: DC | PRN

## 2021-03-14 MED ORDER — ACETAMINOPHEN 325 MG PO TABS
650.0000 mg | ORAL_TABLET | Freq: Four times a day (QID) | ORAL | Status: DC | PRN
  Administered 2021-03-16: 21:00:00 650 mg via ORAL
  Filled 2021-03-14: qty 2

## 2021-03-14 MED ORDER — HEPARIN 1000 UNIT/ML FOR PERITONEAL DIALYSIS
500.0000 [IU] | INTRAMUSCULAR | Status: DC | PRN
  Filled 2021-03-14: qty 0.5

## 2021-03-14 MED ORDER — SODIUM CHLORIDE 0.9 % IV BOLUS (SEPSIS)
250.0000 mL | Freq: Once | INTRAVENOUS | Status: AC
  Administered 2021-03-15: 250 mL via INTRAVENOUS

## 2021-03-14 MED ORDER — HEPARIN SODIUM (PORCINE) 5000 UNIT/ML IJ SOLN
5000.0000 [IU] | Freq: Three times a day (TID) | INTRAMUSCULAR | Status: DC
  Administered 2021-03-14 – 2021-03-24 (×27): 5000 [IU] via SUBCUTANEOUS
  Filled 2021-03-14 (×28): qty 1

## 2021-03-14 MED ORDER — ASPIRIN EC 81 MG PO TBEC
81.0000 mg | DELAYED_RELEASE_TABLET | Freq: Every day | ORAL | Status: DC
  Administered 2021-03-15 – 2021-03-24 (×10): 81 mg via ORAL
  Filled 2021-03-14 (×10): qty 1

## 2021-03-14 MED ORDER — BENZONATATE 100 MG PO CAPS: 200.0000 mg | ORAL_CAPSULE | Freq: Three times a day (TID) | ORAL | Status: DC | PRN

## 2021-03-14 MED ORDER — VANCOMYCIN HCL IN DEXTROSE 1-5 GM/200ML-% IV SOLN: 1000.0000 mg | Freq: Once | INTRAVENOUS | Status: DC

## 2021-03-14 MED ORDER — ATORVASTATIN CALCIUM 20 MG PO TABS
40.0000 mg | ORAL_TABLET | Freq: Every day | ORAL | Status: DC
  Administered 2021-03-15 – 2021-03-24 (×10): 40 mg via ORAL
  Filled 2021-03-14 (×10): qty 2

## 2021-03-14 MED ORDER — SODIUM CHLORIDE 0.9 % IV SOLN: 2.0000 g | Freq: Once | INTRAVENOUS | Status: DC

## 2021-03-14 MED ORDER — SODIUM CHLORIDE 0.9 % IV BOLUS: 250.0000 mL | Freq: Once | INTRAVENOUS | Status: DC

## 2021-03-14 MED ORDER — NYSTATIN 500000 UNITS PO TABS
1.0000 | ORAL_TABLET | Freq: Three times a day (TID) | ORAL | Status: DC
  Filled 2021-03-14 (×9): qty 1

## 2021-03-14 MED ORDER — GENTAMICIN SULFATE 0.1 % EX CREA
1.0000 "application " | TOPICAL_CREAM | Freq: Every day | CUTANEOUS | Status: DC
  Administered 2021-03-14 – 2021-03-18 (×5): 1 via TOPICAL
  Filled 2021-03-14 (×2): qty 15

## 2021-03-14 MED ORDER — BISACODYL 5 MG PO TBEC: 5.0000 mg | DELAYED_RELEASE_TABLET | Freq: Every day | ORAL | Status: DC | PRN

## 2021-03-14 MED ORDER — DELFLEX-LC/1.5% DEXTROSE 344 MOSM/L IP SOLN
INTRAPERITONEAL | Status: DC
  Filled 2021-03-14 (×15): qty 3000

## 2021-03-14 MED ORDER — VANCOMYCIN HCL 1250 MG/250ML IV SOLN
1250.0000 mg | Freq: Once | INTRAVENOUS | Status: AC
  Administered 2021-03-14: 1250 mg via INTRAVENOUS
  Filled 2021-03-14 (×2): qty 250

## 2021-03-14 MED ORDER — ONDANSETRON HCL 4 MG/2ML IJ SOLN: 4.0000 mg | Freq: Four times a day (QID) | INTRAMUSCULAR | Status: DC | PRN

## 2021-03-14 MED ORDER — OXYCODONE HCL 5 MG PO TABS
5.0000 mg | ORAL_TABLET | ORAL | Status: DC | PRN
  Administered 2021-03-14 – 2021-03-21 (×16): 5 mg via ORAL
  Filled 2021-03-14 (×16): qty 1

## 2021-03-14 NOTE — Progress Notes (Signed)
Patient presents to ED with complaint of abdominal pain, has LUQ PD catheter. Patient recently discharged on ABT for peritonitis reports some improvement in symptoms, but pain has returned. PD sample is collected after a transfer set change. Fluid is cloudy, with fibrin noted. Patient reports that he has decided to return to hemodialysis, citing multiple reasons to end PD treatment, most stem from inadequate treatment, space for supplies, and recent infection. Patient was encouraged to fully discuss options with Nephrologist.  HD Charge Nurse transported sample to the lab and, handed to lab technician.

## 2021-03-14 NOTE — Plan of Care (Signed)
  Problem: Clinical Measurements: Goal: Ability to maintain clinical measurements within normal limits will improve Outcome: Progressing   

## 2021-03-14 NOTE — H&P (Signed)
[facility] History and Physical   Patient: Anthony Hess CLE:[id number]   PCP: Pcp, No DOB: 1955/09/12   DOA: 03/14/2021   DOS: 03/14/2021   DOS: the patient was seen and examined on 03/14/2021  Patient coming from: The patient is coming from Home  Chief Complaint: Worsening of abdominal pain  HPI: Kaelon Weekes is a 66 y.o. male with Past medical history of CAD s/p CABG, HTN, A. fib, CHF, ESRD on PD, history of bladder cancer, hyperparathyroidism, anemia of chronic disease, recently discharged on 1/16, admitted due to SBP, antibiotics were given.  Patient was discharged home.  Patient presented back in the ED secondary to worsening of abdominal pain.    ED Course: BP soft 99/81, saturating well on room air, Potassium 3.4, creatinine 8.7, albumin 1.6, lactic acid 2.0, procalcitonin 1.35 WBC 15.7 and 10.0 Respiratory viral panel is pending Blood culture drawn CT abdomen pelvis shows possible enteritis, ascites, pneumoperitoneum due to PD catheter, edema, 3.2 cm AAA, every 3-year ultrasound follow-up recommended Abdominal sonogram pending   Review of Systems: as mentioned in the history of present illness.  All other systems reviewed and are negative.   Assessment/Plan Principal Problem:   Sepsis ([place]) Active Problems:   History of bladder cancer   Hypertension   Atrial fibrillation (HCC)   ESRD (end stage renal disease) ([place])   Sepsis secondary to SBP Lactic acid 2.0, procalcitonin 1.35, WBC 15.7 hypotension BP 99/81 Patient was started on vancomycin and ceftazidime in the ED which has been continued Follow blood cultures Pharmacy consulted for dosing and Vanco trough monitoring Consider ID consult for antibiotics and duration of treatment   ESRD on PD, Nephrology was informed by ED physician   CAD stable CABG, HTN, A. fib, CHF, abdominal aortic aneurysm Recommended ultrasound every 3 years for AAA Health antihypertensive medications for now due to soft blood  pressure Resumed aspirin and statin Monitor BP and titrate medications accordingly   History of bladder cancer, s/p transurethral resection. Management per onco outpatient         Nutrition: Renal diet DVT Prophylaxis: Subcutaneous Heparin   Advance goals of care discussion: Full code   Consults: Nephrology, informed by ED physician   Family Communication: family was not present at bedside, at the time of interview.  opportunity was given to ask question and all questions were answered satisfactorily.  Disposition: Admitted as inpatient, telemetry- Med unit. Likely to be discharged home, in 2-3 days when clinically stable.   Past Medical History:  Diagnosis Date   Bladder tumor    Cancer [facility])    Bladder   Chronic kidney disease    renal insufficiency   Coronary artery disease    History of kidney stones    Hypertension    Myocardial infarction [facility])    Wears glasses    Past Surgical History:  Procedure Laterality Date   CAPD INSERTION N/A 12/31/2019   Procedure: LAPAROSCOPIC INSERTION CONTINUOUS AMBULATORY PERITONEAL DIALYSIS  (CAPD) CATHETER;  Surgeon: Jules Husbands, MD;  Location: [facility] ORS;  Service: General;  Laterality: N/A;   CAPD REMOVAL N/A 04/10/2020   Procedure: LAPAROSCOPIC REVISION OF CONTINUOUS AMBULATORY PERITONEAL DIALYSIS  (CAPD) CATHETER;  Surgeon: Jules Husbands, MD;  Location: [facility] ORS;  Service: General;  Laterality: N/A;   CORONARY ARTERY BYPASS GRAFT N/A 12/27/2018   Procedure: CORONARY ARTERY BYPASS GRAFTING (CABG) X 4 ON PUMP USING RIGHT & LEFT INTERNAL MAMMARY ARTERY LEFT RADIAL ARTERY ENDOSCOPICALLY HARVESTED;  Surgeon: [name], MD;  Location:  [place] OR;  Service: Open Heart Surgery;  Laterality: N/A;   CYSTOSCOPY W/ RETROGRADES Bilateral 05/15/2019   Procedure: CYSTOSCOPY WITH RETROGRADE PYELOGRAM;  Surgeon: Abbie Sons, MD;  Location: [facility] ORS;  Service: Urology;  Laterality: Bilateral;   CYSTOSCOPY WITH BIOPSY N/A 05/15/2019    Procedure: CYSTOSCOPY WITH bladder BIOPSY;  Surgeon: Abbie Sons, MD;  Location: [facility] ORS;  Service: Urology;  Laterality: N/A;   DIALYSIS/PERMA CATHETER INSERTION N/A 12/28/2019   Procedure: DIALYSIS/PERMA CATHETER INSERTION;  Surgeon: Algernon Huxley, MD;  Location: [place] CV LAB;  Service: Cardiovascular;  Laterality: N/A;   DIALYSIS/PERMA CATHETER REMOVAL N/A 06/02/2020   Procedure: DIALYSIS/PERMA CATHETER REMOVAL;  Surgeon: Algernon Huxley, MD;  Location: [place] CV LAB;  Service: Cardiovascular;  Laterality: N/A;   EXCHANGE OF A DIALYSIS CATHETER Right 04/10/2020   Procedure: EXCHANGE OF A DIALYSIS CATHETER;  Surgeon: Jules Husbands, MD;  Location: [facility] ORS;  Service: General;  Laterality: Right;   [place]  01/20/2021   Procedure: HERNIA REPAIR INCISIONAL;  Surgeon: Olean Ree, MD;  Location: [facility] ORS;  Service: General;;   IR IMAGE GUIDED DRAINAGE PERCUT CATH  PERITONEAL RETROPERIT  04/07/2020   LEFT HEART CATH AND CORONARY ANGIOGRAPHY Left 12/20/2018   Procedure: LEFT HEART CATH AND CORONARY ANGIOGRAPHY;  Surgeon: Isaias Cowman, MD;  Location: [place] [facility] LAB;  Service: Cardiovascular;  Laterality: Left;   RADIAL ARTERY HARVEST Left 12/27/2018   Procedure: ENDOSCOPIC RADIAL ARTERY HARVEST;  Surgeon: [name], MD;  Location: [place];  Service: Open Heart Surgery;  Laterality: Left;   TEE WITHOUT CARDIOVERSION N/A 12/27/2018   Procedure: TRANSESOPHAGEAL ECHOCARDIOGRAM (TEE);  Surgeon: [name], MD;  Location: [place];  Service: Open Heart Surgery;  Laterality: N/A;   TUMOR REMOVAL  2019   Bladder   Social History:  reports that he quit smoking about 2 years ago. His smoking use included cigarettes. He smoked an average of .25 packs per day. He has never used smokeless tobacco. He reports that he does not drink alcohol and does not use drugs.  No Known Allergies   Family history reviewed and not pertinent Family History  Family  history unknown: Yes     Prior to Admission medications   Medication Sig Start Date End Date Taking? Authorizing Provider  acetaminophen (TYLENOL) 500 MG tablet Take 2 tablets (1,000 mg total) by mouth every 6 (six) hours as needed for mild pain. 01/20/21   Olean Ree, MD  aspirin EC 81 MG tablet Take 81 mg by mouth daily.    [name], MD  atorvastatin (LIPITOR) 40 MG tablet Take 1 tablet (40 mg total) by mouth daily. 01/01/20   Fritzi Mandes, MD  benzonatate (TESSALON) 200 MG capsule Take 200 mg by mouth 3 (three) times daily as needed. 02/27/21   [provider]  calcitRIOL (ROCALTROL) 0.5 MCG capsule Take 1 capsule (0.5 mcg total) by mouth daily. 01/01/20   Fritzi Mandes, MD  calcium acetate (PHOSLO) 667 MG capsule Take 2 capsules (1,334 mg total) by mouth 3 (three) times daily with meals. Patient taking differently: Take 1,334 mg by mouth 2 (two) times daily with a meal. 01/01/20   Fritzi Mandes, MD  calcium carbonate (OS-CAL - DOSED IN MG OF ELEMENTAL CALCIUM) 1250 (500 Ca) MG tablet Take 1 tablet (1,250 mg total) by mouth 2 (two) times daily between meals. 01/01/20   Fritzi Mandes, MD  hydrALAZINE (APRESOLINE) 10 MG tablet Take 10 mg by mouth  3 (three) times daily. 01/12/21   [provider]  irbesartan (AVAPRO) 150 MG tablet Take 150 mg by mouth daily. 09/18/20   [provider]  metoprolol succinate (TOPROL-XL) 50 MG 24 hr tablet Take 1 tablet (50 mg total) by mouth daily. Take with or immediately following a meal. 01/01/20   Fritzi Mandes, MD  nystatin (MYCOSTATIN) 500000 units TABS tablet Take 1 tablet by mouth 3 (three) times daily. Patient not taking: Reported on 03/05/2021 02/01/21   [provider]  ondansetron (ZOFRAN-ODT) 8 MG disintegrating tablet Take 8 mg by mouth every 8 (eight) hours as needed. 02/27/21   [provider]  VELPHORO 500 MG chewable tablet Chew 1,000 mg by mouth 3 (three) times daily. 01/21/21   [provider]   Vitamin D, Ergocalciferol, (DRISDOL) 1.25 MG (50000 UNIT) CAPS capsule Take 1 capsule (50,000 Units total) by mouth every 7 (seven) days. Patient taking differently: Take 50,000 Units by mouth every Saturday. 01/05/20   Fritzi Mandes, MD    Physical Exam: Vitals:   03/14/21 1022 03/14/21 1025 03/14/21 1533  BP: 114/84 114/84 99/81  Pulse: 90 90 82  Resp: 20 18 18   Temp:  98.2 F (36.8 C) 97.8 F (36.6 C)  TempSrc:  Oral Oral  SpO2: 98% 92% 97%  Weight: 62.1 kg    Height: 5\' 3"  (1.6 m)      General: alert and oriented to time, place, and person. Appear in mild distress, affect appropriate Eyes: PERRLA, Conjunctiva normal ENT: Oral Mucosa Clear, moist  Neck: no JVD,  Cardiovascular: S1 and S2 Present, no Murmur, peripheral pulses symmetrical Respiratory: good respiratory effort, Bilateral Air entry equal and Decreased, no signs of accessory muscle use, Clear to Auscultation, mild Crackles, no wheezes Abdomen: Bowel Sound present, Soft and generalized tenderness, no hernia, PD catheter intact Skin: no rashes  Extremities: no significant pedal edema, no calf tenderness Neurologic: without any new focal findings Gait not checked due to patient safety concerns  Data Reviewed: I have personally reviewed and interpreted labs, imaging as discussed below.  CBC: Recent Labs  Lab 03/08/21 0539 03/09/21 0539 03/14/21 1025  WBC 12.6* 12.6* 15.7*  HGB 9.4* 9.2* 10.0*  HCT 29.5* 29.5* 32.7*  MCV 85.8 84.5 87.7  PLT 491* 520* 947*   Basic Metabolic Panel: Recent Labs  Lab 03/08/21 0539 03/09/21 0539 03/14/21 1025  NA 137 135 138  K 3.8 3.4* 3.4*  CL 101 98 97*  CO2 17* 23 28  GLUCOSE 88 116* 106*  BUN 94* 82* 71*  CREATININE 10.99* 9.60* 8.74*  CALCIUM 6.9* 7.1* 7.0*  MG 2.1 1.9  --    GFR: Estimated Creatinine Clearance: 7 mL/min (A) (by C-G formula based on SCr of 8.74 mg/dL (H)). Liver Function Tests: Recent Labs  Lab 03/14/21 1025  AST 23  ALT <5  ALKPHOS 56   BILITOT 0.1*  PROT 5.2*  ALBUMIN 1.6*   Recent Labs  Lab 03/14/21 1025  LIPASE 25   No results for input(s): AMMONIA in the last 168 hours. Coagulation Profile: No results for input(s): INR, PROTIME in the last 168 hours. Cardiac Enzymes: No results for input(s): CKTOTAL, CKMB, CKMBINDEX, TROPONINI in the last 168 hours. BNP (last 3 results) No results for input(s): PROBNP in the last 8760 hours. HbA1C: No results for input(s): HGBA1C in the last 72 hours. CBG: No results for input(s): GLUCAP in the last 168 hours. Lipid Profile: No results for input(s): CHOL, HDL, LDLCALC, TRIG, CHOLHDL, LDLDIRECT in  the last 72 hours. Thyroid Function Tests: No results for input(s): TSH, T4TOTAL, FREET4, T3FREE, THYROIDAB in the last 72 hours. Anemia Panel: No results for input(s): VITAMINB12, FOLATE, FERRITIN, TIBC, IRON, RETICCTPCT in the last 72 hours. Urine analysis:    Component Value Date/Time   COLORURINE STRAW (A) 12/27/2019 1756   APPEARANCEUR CLEAR (A) 12/27/2019 1756   APPEARANCEUR Cloudy (A) 05/08/2019 1448   LABSPEC 1.010 12/27/2019 1756   PHURINE 5.0 12/27/2019 1756   GLUCOSEU NEGATIVE 12/27/2019 1756   HGBUR NEGATIVE 12/27/2019 1756   BILIRUBINUR NEGATIVE 12/27/2019 1756   BILIRUBINUR Negative 05/08/2019 1448   KETONESUR NEGATIVE 12/27/2019 1756   PROTEINUR >=300 (A) 12/27/2019 1756   NITRITE NEGATIVE 12/27/2019 1756   LEUKOCYTESUR NEGATIVE 12/27/2019 1756    Radiological Exams on Admission: CT ABDOMEN PELVIS WO CONTRAST  Result Date: 03/14/2021 CLINICAL DATA:  66 year old male with acute abdominal and pelvic pain and nausea and vomiting. Patient on peritoneal dialysis. EXAM: CT ABDOMEN AND PELVIS WITHOUT CONTRAST TECHNIQUE: Multidetector CT imaging of the abdomen and pelvis was performed following the standard protocol without IV contrast. COMPARISON:  03/05/2021 and prior studies FINDINGS: Please note that parenchymal and vascular abnormalities may be missed as  intravenous contrast was not administered. Lower chest: Increased LEFT basilar atelectasis noted. Hepatobiliary: Unchanged low-density hepatic lesions again noted likely representing cysts or hemangiomas. The gallbladder is mildly distended with possible sludge versus cholelithiasis. Mild gallbladder wall thickening is identified. There is no evidence of intrahepatic or extrahepatic biliary dilatation. Pancreas: Unremarkable Spleen: Unremarkable Adrenals/Urinary Tract: Atrophic kidneys and moderate to severe RIGHT hydroureteronephrosis again noted. LEFT hydronephrosis has resolved. The adrenal glands are unremarkable. Mild bladder wall thickening is unchanged. Stomach/Bowel: Mildly distended thick walled loops of small bowel are noted and involving the majority of the small bowel loops. There is no transition point identified and gas and stool is noted within the colon. Vascular/Lymphatic: Abdominal aortic aneurysm measuring 3.2 cm in maximal diameter again noted and unchanged. Aortic atherosclerotic calcifications are present. No vascular changes are otherwise noted. No enlarged lymph nodes are identified. Reproductive: No significant prostate abnormalities noted. Other: Small amount of perihepatic ascites is noted. Stranding throughout the mesentery and subcutaneous tissues is increased. Peritoneal dialysis catheter is present without definite abnormality. A trace amount of pneumoperitoneum within the anterior mid abdomen (series 2: Image 31) is not unexpected given peritoneal dialysis. A small RIGHT paramedian ventral hernia containing fat is present (2:45). Musculoskeletal: No acute or suspicious bony abnormalities are present. Scoliosis and degenerative changes within the lumbar spine again noted. Degenerative changes in the hips again identified. IMPRESSION: 1. Mildly distended thick-walled loops of small bowel and colon without transition point, nonspecific but may represent an enteritis. 2. Small amount of  perihepatic ascites and trace amount of pneumoperitoneum within the anterior mid abdomen, not unexpected given peritoneal dialysis. Correlate clinically. 3. Increased stranding throughout the mesentery and subcutaneous tissues which may be related to edema. 4. Mildly distended gallbladder with possible sludge versus cholelithiasis. Mild gallbladder wall thickening is nonspecific but may be related to ascites. If there is clinical concern for acute cholecystitis, consider ultrasound or nuclear medicine study. 5. Unchanged 3.2 cm abdominal aortic aneurysm. Recommend follow-up ultrasound every 3 years. This recommendation follows ACR consensus guidelines: White Paper of the ACR Incidental Findings Committee II on Vascular Findings. J Am Coll Radiol 2013; 10:789-794. 6. Increased LEFT basilar atelectasis. 7. Aortic Atherosclerosis (ICD10-I70.0). Electronically Signed   By: Margarette Canada M.D.   On: 03/14/2021 12:02   DG Chest  Port 1 View  Result Date: 03/14/2021 CLINICAL DATA:  Question sepsis EXAM: PORTABLE CHEST 1 VIEW COMPARISON:  03/04/2021 FINDINGS: Postop CABG. Heart size and vascularity normal. Negative for edema or effusion Mild bibasilar atelectasis left greater than right has developed since the prior study. IMPRESSION: Mild bibasilar atelectasis. Electronically Signed   By: Franchot Gallo M.D.   On: 03/14/2021 17:46     I reviewed all nursing notes, pharmacy notes, vitals, pertinent old records.   I have discussed plan of care as described above with RN and patient/family.  Severity of Illness: The appropriate patient status for this patient is INPATIENT. Inpatient status is judged to be reasonable and necessary in order to provide the required intensity of service to ensure the patient's safety. The patient's presenting symptoms, physical exam findings, and initial radiographic and laboratory data in the context of their chronic comorbidities is felt to place them at high risk for further clinical  deterioration. Furthermore, it is not anticipated that the patient will be medically stable for discharge from the hospital within 2 midnights of admission.   * I certify that at the point of admission it is my clinical judgment that the patient will require inpatient hospital care spanning beyond 2 midnights from the point of admission due to high intensity of service, high risk for further deterioration and high frequency of surveillance required.*   Author: Val Riles, MD [facility] 03/14/2021 6:14 PM   To reach On-call, see care teams to locate the attending and reach out to them via [email or web address]. If 7PM-7AM, please contact night-coverage If you still have difficulty reaching the attending provider, please page the [facility] (Director on Call) for [facility] on amion for assistance.

## 2021-03-14 NOTE — ED Provider Triage Note (Signed)
Emergency Medicine Provider Triage Evaluation Note  Anthony Hess , a 66 y.o. male  was evaluated in triage.  Pt complains of abd pain.  PD patient who was admitted this past week for SBP.  Reports worsening pain now..  Review of Systems  Positive: Abd pain, N/V Negative:   Physical Exam  BP 114/84 (BP Location: Left Arm)    Pulse 90    Resp 20    Ht 5\' 3"  (1.6 m)    Wt 62.1 kg    SpO2 98%    BMI 24.27 kg/m  Gen:   Awake, no distress   Resp:  Normal effort  MSK:   Moves extremities without difficulty  Other:  Abd Ttp and voluntary guarding  Medical Decision Making  Medically screening exam initiated at 10:29 AM.  Appropriate orders placed.  Sukhraj Knoblock was informed that the remainder of the evaluation will be completed by another provider, this initial triage assessment does not replace that evaluation, and the importance of remaining in the ED until their evaluation is complete.     Vladimir Crofts, MD 03/14/21 1029

## 2021-03-14 NOTE — Consult Note (Signed)
CODE SEPSIS - PHARMACY COMMUNICATION  **Broad Spectrum Antibiotics should be administered within 1 hour of Sepsis diagnosis**  Time Code Sepsis Called/Page Received: 1722  Antibiotics Ordered: 1722  Time of 1st antibiotic administration: 2033  Additional action taken by pharmacy: Messaged RN  If necessary, Name of Provider/Nurse Contacted: Dareen Piano ,PharmD Clinical Pharmacist  03/14/2021  6:00 PM

## 2021-03-14 NOTE — Sepsis Progress Note (Signed)
Secure chat with the provider requesting for lactic acid order. Prior lactic acid was drawn at 1025.

## 2021-03-14 NOTE — ED Provider Notes (Addendum)
[facility] Provider Note    Event Date/Time   First MD Initiated Contact with Patient 03/14/21 1705     (approximate)   History   Abdominal Pain   HPI  Anthony Hess is a 65 y.o. male who comes in reporting pain in his abdomen.  He does peritoneal dialysis at home.  He was seen on Thursday and felt that he might have spontaneous bacterial peritonitis.  He was later discharged.  Patient reports the pain today is worse.  It hurts in his abdomen even to take a deep breath.  He has a low-grade fever 99.5.      Physical Exam   Triage Vital Signs: ED Triage Vitals  Enc Vitals Group     BP 03/14/21 1022 114/84     Pulse Rate 03/14/21 1022 90     Resp 03/14/21 1022 20     Temp 03/14/21 1025 98.2 F (36.8 C)     Temp Source 03/14/21 1025 Oral     SpO2 03/14/21 1022 98 %     Weight 03/14/21 1022 137 lb (62.1 kg)     Height 03/14/21 1022 5\' 3"  (1.6 m)     Head Circumference --      Peak Flow --      Pain Score 03/14/21 1022 8     Pain Loc --      Pain Edu? --      Excl. in [place]? --     Most recent vital signs: Vitals:   03/14/21 1533 03/14/21 2107  BP: 99/81 (!) 139/100  Pulse: 82 88  Resp: 18 18  Temp: 97.8 F (36.6 C) 99.5 F (37.5 C)  SpO2: 97% 98%     General: Awake, alert, no distress.  CV:  Good peripheral perfusion.  Heart regular rate and rhythm no audible murmurs Resp:  Normal effort.  Lungs are clear Abd:  No distention.  Soft but diffusely very tender to palpation or percussion. Extremities very slight trace of edema.   ED Results / Procedures / Treatments   Labs (all labs ordered are listed, but only abnormal results are displayed) Labs Reviewed  COMPREHENSIVE METABOLIC PANEL - Abnormal; Notable for the following components:      Result Value   Potassium 3.4 (*)    Chloride 97 (*)    Glucose, Bld 106 (*)    BUN 71 (*)    Creatinine, Ser 8.74 (*)    Calcium 7.0 (*)    Total Protein 5.2 (*)    Albumin 1.6 (*)    Total  Bilirubin 0.1 (*)    GFR, Estimated 6 (*)    All other components within normal limits  CBC - Abnormal; Notable for the following components:   WBC 15.7 (*)    RBC 3.73 (*)    Hemoglobin 10.0 (*)    HCT 32.7 (*)    Platelets 573 (*)    All other components within normal limits  LACTIC ACID, PLASMA - Abnormal; Notable for the following components:   Lactic Acid, Venous 2.0 (*)    All other components within normal limits  BODY FLUID CELL COUNT WITH DIFFERENTIAL - Abnormal; Notable for the following components:   Color, Fluid COLORLESS (*)    Appearance, Fluid HAZY (*)    All other components within normal limits  CBC WITH DIFFERENTIAL/PLATELET - Abnormal; Notable for the following components:   WBC 16.4 (*)    RBC 3.73 (*)    Hemoglobin 10.2 (*)  HCT 32.3 (*)    Platelets 590 (*)    Neutro Abs 13.9 (*)    Monocytes Absolute 1.4 (*)    Abs Immature Granulocytes 0.13 (*)    All other components within normal limits  PROTIME-INR - Abnormal; Notable for the following components:   Prothrombin Time 20.3 (*)    INR 1.7 (*)    All other components within normal limits  APTT - Abnormal; Notable for the following components:   aPTT 40 (*)    All other components within normal limits  CREATININE, SERUM - Abnormal; Notable for the following components:   Creatinine, Ser 9.40 (*)    GFR, Estimated 6 (*)    All other components within normal limits  RESP PANEL BY RT-PCR (FLU A&B, COVID) ARPGX2  BODY FLUID CULTURE W GRAM STAIN  GASTROINTESTINAL PANEL BY PCR, STOOL (REPLACES STOOL CULTURE)  C DIFFICILE QUICK SCREEN W PCR REFLEX    CULTURE, BLOOD (ROUTINE X 2)  CULTURE, BLOOD (ROUTINE X 2)  URINE CULTURE  LIPASE, BLOOD  LACTIC ACID, PLASMA  PROCALCITONIN  PROCALCITONIN  URINALYSIS, COMPLETE (UACMP) WITH MICROSCOPIC  LACTIC ACID, PLASMA  LACTIC ACID, PLASMA  CBC  BASIC METABOLIC PANEL  MAGNESIUM  PHOSPHORUS     EKG  EKG read interpreted by me shows normal sinus rhythm  rate of 90 slight right axis almost normal axis left bundle branch block there is S1 and Q 3 but no T3 and patient did not have any pleuritic chest pain he did have pain in his abdomen when he took a deep breath but any touching or palpation or movement of his abdomen caused it to be painful in the same way.   RADIOLOGY Patient CT scan read by radiology reviewed by me was read as below.  I did review the film and agree with the reading.  It is easier to copy and paste that and try to summarize that whole thing. 1. Mildly distended thick-walled loops of small bowel and colon without transition point, nonspecific but may represent an enteritis. 2. Small amount of perihepatic ascites and trace amount of pneumoperitoneum within the anterior mid abdomen, not unexpected given peritoneal dialysis. Correlate clinically. 3. Increased stranding throughout the mesentery and subcutaneous tissues which may be related to edema. 4. Mildly distended gallbladder with possible sludge versus cholelithiasis. Mild gallbladder wall thickening is nonspecific but may be related to ascites. If there is clinical concern for acute cholecystitis, consider ultrasound or nuclear medicine study. 5. Unchanged 3.2 cm abdominal aortic aneurysm. Recommend follow-up ultrasound every 3 years. This recommendation follows ACR consensus guidelines: White Paper of the ACR Incidental Findings Committee II on Vascular Findings. J Am Coll Radiol 2013; 10:789-794. 6. Increased LEFT basilar atelectasis. 7. Aortic Atherosclerosis (ICD10-I70.0). Ultrasound was done because of the above reading ordered by me read by radiology and reviewed by me shows only some gallbladder sludge.  Radiology did not feel there were any signs of cholecystitis.  Based on the patient's exam I feel that is most likely.  I reviewed the film but will defer to the radiologist reading Chest x-ray read by radiology reviewed by me shows only some  atelectasis. PROCEDURES:  Critical Care performed: Critical care time 20 minutes.  This includes reviewing all the patient's studies lab work and speaking with both the renal doctor to arrange treatment of his spontaneous bacterial peritonitis with vancomycin and also discussing the care with the hospital doctor.  Procedures   MEDICATIONS ORDERED IN ED: Medications  vancomycin (VANCOREADY) IVPB  1250 mg/250 mL (1,250 mg Intravenous New Bag/Given 03/14/21 2316)  sodium chloride 0.9 % bolus 250 mL (has no administration in time range)  heparin 1000 unit/ml injection 500 Units (has no administration in time range)  gentamicin cream (GARAMYCIN) 0.1 % 1 application (1 application Topical Given 03/14/21 2313)  dialysis solution 1.5% low-MG/low-CA dianeal solution (has no administration in time range)  sodium chloride 0.9 % bolus 250 mL (has no administration in time range)  aspirin EC tablet 81 mg (has no administration in time range)  atorvastatin (LIPITOR) tablet 40 mg (has no administration in time range)  calcium carbonate (OS-CAL - dosed in mg of elemental calcium) tablet 1,250 mg (has no administration in time range)  calcitRIOL (ROCALTROL) capsule 0.5 mcg (has no administration in time range)  Vitamin D (Ergocalciferol) (DRISDOL) capsule 50,000 Units (50,000 Units Oral Given 03/14/21 2312)  acetaminophen (TYLENOL) tablet 650 mg (has no administration in time range)  benzonatate (TESSALON) capsule 200 mg (has no administration in time range)  nystatin (MYCOSTATIN) tablet 500,000 Units (has no administration in time range)  sucroferric oxyhydroxide (VELPHORO) chewable tablet 1,000 mg (1,000 mg Oral Given 03/14/21 2312)  heparin injection 5,000 Units (5,000 Units Subcutaneous Given 03/14/21 2145)  oxyCODONE (Oxy IR/ROXICODONE) immediate release tablet 5 mg (5 mg Oral Given 03/14/21 2145)  bisacodyl (DULCOLAX) EC tablet 5 mg (has no administration in time range)  ondansetron (ZOFRAN) tablet 4 mg  (has no administration in time range)    Or  ondansetron (ZOFRAN) injection 4 mg (has no administration in time range)  cefTAZidime (FORTAZ) 1 g in sodium chloride 0.9 % 100 mL IVPB (0 g Intravenous Stopped 03/14/21 2323)     IMPRESSION / MDM / ASSESSMENT AND PLAN / ED COURSE  I reviewed the triage vital signs and the nursing notes. Patient's presentation and symptoms are most consistent with spontaneous bacterial peritonitis.  Cholecystitis could have been possible although unlikely but does not look likely at all with the results of the ultrasound.  Patient's COVID test was negative.  Patient's white count is elevated lactic acid was elevated procalcitonin was elevated all making me suspicious for bacterial infection.  His electrolytes were okay.   I reviewed all the studies and discussed the care with both the hospitalist and the renal doctor.  We will get the patient in the hospital and treated with IV antibiotics and possibly depending on what the renal doctor feels peritoneal antibiotics.          Some of his old records and his labs were reviewed. The patient is on the cardiac monitor to evaluate for evidence of arrhythmia and/or significant heart rate changes. I did order antibiotics for this patient along with a small amount of fluids since he is a dialysis patient.  I did not want to drown him.  He did meet the criteria for sepsis.      FINAL CLINICAL IMPRESSION(S) / ED DIAGNOSES   Final diagnoses:  Generalized abdominal pain  Spontaneous bacterial peritonitis (HCC)  Sepsis, due to unspecified organism, unspecified whether acute organ dysfunction present [facility])     Rx / DC Orders   ED Discharge Orders     None        Note:  This document was prepared using Dragon voice recognition software and may include unintentional dictation errors.   Nena Polio, MD 03/15/21 Laureen Abrahams    Nena Polio, MD 03/15/21 [phone]

## 2021-03-14 NOTE — ED Notes (Signed)
Waiting on vancomycin and fortaz from pharmacy

## 2021-03-14 NOTE — Consult Note (Signed)
PHARMACY -  BRIEF ANTIBIOTIC NOTE   Pharmacy has received consult(s) for vancomycin from an ED provider.  The patient's profile has been reviewed for ht/wt/allergies/indication/available labs.    One time order(s) placed for vancomycin 1250 mg IV  Further antibiotics/pharmacy consults should be ordered by admitting physician if indicated.                       Thank you, Darnelle Bos, PharmD 03/14/2021  6:02 PM

## 2021-03-14 NOTE — ED Triage Notes (Signed)
Pt states he is having pain in his abdomen d/t his kidneys- pt does peritoneal dialysis at home and has not missed any of his treatments and did his treatment last night- pt states he was see here for same on Thursday- pt states it makes it hard to take a deep breath

## 2021-03-14 NOTE — Sepsis Progress Note (Signed)
Sepsis protocol is being followed by [facility]. 

## 2021-03-14 NOTE — ED Notes (Signed)
Pt transported to ultrasound.

## 2021-03-15 ENCOUNTER — Encounter: Payer: Self-pay | Admitting: Student

## 2021-03-15 LAB — URINALYSIS, COMPLETE (UACMP) WITH MICROSCOPIC
Bacteria, UA: NONE SEEN
Bilirubin Urine: NEGATIVE
Glucose, UA: NEGATIVE mg/dL
Ketones, ur: NEGATIVE mg/dL
Leukocytes,Ua: NEGATIVE
Nitrite: NEGATIVE
Protein, ur: 30 mg/dL — AB
Specific Gravity, Urine: 1.015 (ref 1.005–1.030)
pH: 5 (ref 5.0–8.0)

## 2021-03-15 LAB — BASIC METABOLIC PANEL
Anion gap: 17 — ABNORMAL HIGH (ref 5–15)
BUN: 72 mg/dL — ABNORMAL HIGH (ref 8–23)
CO2: 25 mmol/L (ref 22–32)
Calcium: 6.4 mg/dL — CL (ref 8.9–10.3)
Chloride: 96 mmol/L — ABNORMAL LOW (ref 98–111)
Creatinine, Ser: 8.24 mg/dL — ABNORMAL HIGH (ref 0.61–1.24)
GFR, Estimated: 7 mL/min — ABNORMAL LOW (ref >60)
Glucose, Bld: 98 mg/dL (ref 70–99)
Potassium: 3.5 mmol/L (ref 3.5–5.1)
Sodium: 138 mmol/L (ref 135–145)

## 2021-03-15 LAB — PROCALCITONIN: Procalcitonin: 1.14 ng/mL

## 2021-03-15 LAB — CBC
HCT: 29.4 % — ABNORMAL LOW (ref 39.0–52.0)
Hemoglobin: 9.2 g/dL — ABNORMAL LOW (ref 13.0–17.0)
MCH: 27 pg (ref 26.0–34.0)
MCHC: 31.3 g/dL (ref 30.0–36.0)
MCV: 86.2 fL (ref 80.0–100.0)
Platelets: 538 10*3/uL — ABNORMAL HIGH (ref 150–400)
RBC: 3.41 MIL/uL — ABNORMAL LOW (ref 4.22–5.81)
RDW: 15.6 % — ABNORMAL HIGH (ref 11.5–15.5)
WBC: 14.1 10*3/uL — ABNORMAL HIGH (ref 4.0–10.5)
nRBC: 0 % (ref 0.0–0.2)

## 2021-03-15 LAB — PHOSPHORUS: Phosphorus: 7.4 mg/dL — ABNORMAL HIGH (ref 2.5–4.6)

## 2021-03-15 LAB — MAGNESIUM: Magnesium: 1.7 mg/dL (ref 1.7–2.4)

## 2021-03-15 MED ORDER — ENSURE MAX PROTEIN PO LIQD
11.0000 [oz_av] | Freq: Two times a day (BID) | ORAL | Status: DC
  Administered 2021-03-15 – 2021-03-18 (×4): 11 [oz_av] via ORAL
  Filled 2021-03-15: qty 330

## 2021-03-15 MED ORDER — NYSTATIN 100000 UNIT/ML MT SUSP
5.0000 mL | Freq: Three times a day (TID) | OROMUCOSAL | Status: DC
  Administered 2021-03-15 – 2021-03-24 (×22): 500000 [IU] via ORAL
  Filled 2021-03-15 (×23): qty 5

## 2021-03-15 MED ORDER — HEPARIN SODIUM (PORCINE) 1000 UNIT/ML IJ SOLN
INTRAMUSCULAR | Status: AC
  Filled 2021-03-15: qty 10

## 2021-03-15 MED ORDER — CHLORHEXIDINE GLUCONATE CLOTH 2 % EX PADS
6.0000 | MEDICATED_PAD | Freq: Every day | CUTANEOUS | Status: DC
  Administered 2021-03-15 – 2021-03-24 (×10): 6 via TOPICAL

## 2021-03-15 NOTE — Progress Notes (Signed)
PD patient scheduled 10-hour treatment incomplete due to slow drain per patient report,  the cycler was turned off overnight after troubleshooting with [employer]. Examination of fluid shows large fibrin, heparin will be added to dialysate for next treatment.  Patient reports continued abdominal discomfort and tenderness noted with dressing change. Patient reiterates the desire to discontinue PD and return to HD, informed the Nephrologist will be notified, and Dr. [name] was made aware. Primary nurse at bedside report given.

## 2021-03-15 NOTE — Progress Notes (Signed)
[place], [place] 03/15/21  Subjective:   Hospital day # 1   Patient presents with abdominal pain PD fluid analysis shows peritonitis Patient reports he was taking antibiotics; original pain did not get better Overnight there was some technical difficulty with PD  Patient has lots of fibrin and fluid is cloudy Getting broad spectrum antibiotics No nausea or vomiting. States he is able to eat Reports no loose stool since being in the hospital   Objective:  Vital signs in last 24 hours:  Temp:  [97.8 F (36.6 C)-99.5 F (37.5 C)] 98.1 F (36.7 C) (01/22 0733) Pulse Rate:  [82-93] 92 (01/22 0733) Resp:  [18-20] 18 (01/22 0733) BP: (99-139)/(81-100) 122/95 (01/22 0733) SpO2:  [94 %-98 %] 94 % (01/22 0733) Weight:  [55.8 kg] 55.8 kg (01/21 2107)  Weight change:  Filed Weights   03/14/21 1022 03/14/21 2107  Weight: 62.1 kg 55.8 kg    Intake/Output:    Intake/Output Summary (Last 24 hours) at 03/15/2021 1218 Last data filed at 03/15/2021 1010 Gross per 24 hour  Intake 10600 ml  Output 10274 ml  Net 326 ml    Physical Exam: General:  No acute distress, laying in the bed  HEENT  anicteric, moist oral mucous membrane  Pulm/lungs  normal breathing effort, lungs are clear to auscultation  CVS/Heart  regular rhythm, no rub or gallop  Abdomen:   Soft, diffusely tender, PD cathter in place  Extremities:  No peripheral edema  Neurologic:  Alert, oriented, able to follow commands  Skin:  No acute rashes       Basic Metabolic Panel:  Recent Labs  Lab 03/09/21 0539 03/14/21 1025 03/14/21 2120 03/15/21 0808  NA 135 138  --  138  K 3.4* 3.4*  --  3.5  CL 98 97*  --  96*  CO2 23 28  --  25  GLUCOSE 116* 106*  --  98  BUN 82* 71*  --  72*  CREATININE 9.60* 8.74* 9.40* 8.24*  CALCIUM 7.1* 7.0*  --  6.4*  MG 1.9  --   --  1.7  PHOS  --   --   --  7.4*     CBC: Recent Labs  Lab 03/09/21 0539 03/14/21 1025 03/14/21 2112  03/15/21 0808  WBC 12.6* 15.7* 16.4* 14.1*  NEUTROABS  --   --  13.9*  --   HGB 9.2* 10.0* 10.2* 9.2*  HCT 29.5* 32.7* 32.3* 29.4*  MCV 84.5 87.7 86.6 86.2  PLT 520* 573* 590* 538*      Lab Results  Component Value Date   HEPBSAG NON REACTIVE 12/27/2019   HEPBSAB NON REACTIVE 12/27/2019   HEPBIGM NON REACTIVE 12/27/2019      Microbiology:  Recent Results (from the past 240 hour(s))  Resp Panel by RT-PCR (Flu A&B, Covid) Nasopharyngeal Swab     Status: None   Collection Time: 03/14/21  5:14 PM   Specimen: Nasopharyngeal Swab; Nasopharyngeal(NP) swabs in vial transport medium  Result Value Ref Range Status   SARS Coronavirus 2 by RT PCR NEGATIVE NEGATIVE Final    Comment: (NOTE) SARS-CoV-2 target nucleic acids are NOT DETECTED.  The SARS-CoV-2 RNA is generally detectable in upper respiratory specimens during the acute phase of infection. The lowest concentration of SARS-CoV-2 viral copies this assay can detect is 138 copies/mL. A negative result does not preclude SARS-Cov-2 infection and should not be used as the sole basis for treatment or other patient management decisions. A negative  result may occur with  improper specimen collection/handling, submission of specimen other than nasopharyngeal swab, presence of viral mutation(s) within the areas targeted by this assay, and inadequate number of viral copies(<138 copies/mL). A negative result must be combined with clinical observations, patient history, and epidemiological information. The expected result is Negative.  Fact Sheet for Patients:  EntrepreneurPulse.com.au  Fact Sheet for Healthcare Providers:  IncredibleEmployment.be  This test is no t yet approved or cleared by the [place] FDA and  has been authorized for detection and/or diagnosis of SARS-CoV-2 by FDA under an Emergency Use Authorization (EUA). This EUA will remain  in effect (meaning this test can be used)  for the duration of the COVID-19 declaration under Section 564(b)(1) of the Act, 21 U.S.C.section 360bbb-3(b)(1), unless the authorization is terminated  or revoked sooner.       Influenza A by PCR NEGATIVE NEGATIVE Final   Influenza B by PCR NEGATIVE NEGATIVE Final    Comment: (NOTE) The Xpert Xpress SARS-CoV-2/FLU/RSV plus assay is intended as an aid in the diagnosis of influenza from Nasopharyngeal swab specimens and should not be used as a sole basis for treatment. Nasal washings and aspirates are unacceptable for Xpert Xpress SARS-CoV-2/FLU/RSV testing.  Fact Sheet for Patients: EntrepreneurPulse.com.au  Fact Sheet for Healthcare Providers: IncredibleEmployment.be  This test is not yet approved or cleared by the [place] FDA and has been authorized for detection and/or diagnosis of SARS-CoV-2 by FDA under an Emergency Use Authorization (EUA). This EUA will remain in effect (meaning this test can be used) for the duration of the COVID-19 declaration under Section 564(b)(1) of the Act, 21 U.S.C. section 360bbb-3(b)(1), unless the authorization is terminated or revoked.  Performed at [facility], [address] [place], [place] [address]   Blood Culture (routine x 2)     Status: None (Preliminary result)   Collection Time: 03/14/21  5:14 PM   Specimen: BLOOD  Result Value Ref Range Status   Specimen Description BLOOD RIGHT ANTECUBITAL  Final   Special Requests   Final    BOTTLES DRAWN AEROBIC AND ANAEROBIC Blood Culture adequate volume   Culture   Final    NO GROWTH < 12 HOURS Performed at [facility], [address] [place], [place] [address]    Report Status PENDING  Incomplete  Blood Culture (routine x 2)     Status: None (Preliminary result)   Collection Time: 03/14/21  9:12 PM   Specimen: BLOOD  Result Value Ref Range Status   Specimen Description BLOOD RIGHT ANTECUBITAL  Final   Special Requests    Final    BOTTLES DRAWN AEROBIC AND ANAEROBIC Blood Culture adequate volume   Culture   Final    NO GROWTH < 12 HOURS Performed at [facility], [address] [place], [place] [address]    Report Status PENDING  Incomplete    Coagulation Studies: Recent Labs    03/14/21 06-03-2110  LABPROT 20.3*  INR 1.7*    Urinalysis: No results for input(s): COLORURINE, LABSPEC, PHURINE, GLUCOSEU, HGBUR, BILIRUBINUR, KETONESUR, PROTEINUR, UROBILINOGEN, NITRITE, LEUKOCYTESUR in the last 72 hours.  Invalid input(s): APPERANCEUR    Imaging: CT ABDOMEN PELVIS WO CONTRAST  Result Date: 03/14/2021 CLINICAL DATA:  66 year old male with acute abdominal and pelvic pain and nausea and vomiting. Patient on peritoneal dialysis. EXAM: CT ABDOMEN AND PELVIS WITHOUT CONTRAST TECHNIQUE: Multidetector CT imaging of the abdomen and pelvis was performed following the standard protocol without IV contrast. COMPARISON:  03/05/2021 and  prior studies FINDINGS: Please note that parenchymal and vascular abnormalities may be missed as intravenous contrast was not administered. Lower chest: Increased LEFT basilar atelectasis noted. Hepatobiliary: Unchanged low-density hepatic lesions again noted likely representing cysts or hemangiomas. The gallbladder is mildly distended with possible sludge versus cholelithiasis. Mild gallbladder wall thickening is identified. There is no evidence of intrahepatic or extrahepatic biliary dilatation. Pancreas: Unremarkable Spleen: Unremarkable Adrenals/Urinary Tract: Atrophic kidneys and moderate to severe RIGHT hydroureteronephrosis again noted. LEFT hydronephrosis has resolved. The adrenal glands are unremarkable. Mild bladder wall thickening is unchanged. Stomach/Bowel: Mildly distended thick walled loops of small bowel are noted and involving the majority of the small bowel loops. There is no transition point identified and gas and stool is noted within the colon. Vascular/Lymphatic:  Abdominal aortic aneurysm measuring 3.2 cm in maximal diameter again noted and unchanged. Aortic atherosclerotic calcifications are present. No vascular changes are otherwise noted. No enlarged lymph nodes are identified. Reproductive: No significant prostate abnormalities noted. Other: Small amount of perihepatic ascites is noted. Stranding throughout the mesentery and subcutaneous tissues is increased. Peritoneal dialysis catheter is present without definite abnormality. A trace amount of pneumoperitoneum within the anterior mid abdomen (series 2: Image 31) is not unexpected given peritoneal dialysis. A small RIGHT paramedian ventral hernia containing fat is present (2:45). Musculoskeletal: No acute or suspicious bony abnormalities are present. Scoliosis and degenerative changes within the lumbar spine again noted. Degenerative changes in the hips again identified. IMPRESSION: 1. Mildly distended thick-walled loops of small bowel and colon without transition point, nonspecific but may represent an enteritis. 2. Small amount of perihepatic ascites and trace amount of pneumoperitoneum within the anterior mid abdomen, not unexpected given peritoneal dialysis. Correlate clinically. 3. Increased stranding throughout the mesentery and subcutaneous tissues which may be related to edema. 4. Mildly distended gallbladder with possible sludge versus cholelithiasis. Mild gallbladder wall thickening is nonspecific but may be related to ascites. If there is clinical concern for acute cholecystitis, consider ultrasound or nuclear medicine study. 5. Unchanged 3.2 cm abdominal aortic aneurysm. Recommend follow-up ultrasound every 3 years. This recommendation follows ACR consensus guidelines: White Paper of the ACR Incidental Findings Committee II on Vascular Findings. J Am Coll Radiol 2013; 10:789-794. 6. Increased LEFT basilar atelectasis. 7. Aortic Atherosclerosis (ICD10-I70.0). Electronically Signed   By: Margarette Canada M.D.   On:  03/14/2021 12:02   DG Chest Port 1 View  Result Date: 03/14/2021 CLINICAL DATA:  Question sepsis EXAM: PORTABLE CHEST 1 VIEW COMPARISON:  03/04/2021 FINDINGS: Postop CABG. Heart size and vascularity normal. Negative for edema or effusion Mild bibasilar atelectasis left greater than right has developed since the prior study. IMPRESSION: Mild bibasilar atelectasis. Electronically Signed   By: Franchot Gallo M.D.   On: 03/14/2021 17:46   US Abdomen Limited RUQ (LIVER/GB)  Result Date: 03/14/2021 CLINICAL DATA:  Gallbladder sludge EXAM: ULTRASOUND ABDOMEN LIMITED RIGHT UPPER QUADRANT COMPARISON:  CT today FINDINGS: Gallbladder: Small layering stones within the gallbladder. Gallbladder wall is borderline thickened at 4 mm. Negative sonographic Murphy's. Common bile duct: Diameter: Normal caliber, 5 mm Liver: Multiple hyperechoic lesions within the liver most compatible with hemangiomas, the largest in the left hepatic lobe measuring up to 2.6 cm. Normal echotexture otherwise. No biliary ductal dilatation. Portal vein is patent on color Doppler imaging with normal direction of blood flow towards the liver. Other: Small amount of ascites adjacent to the liver edge. IMPRESSION: Small layering gallstones. Slight gallbladder wall thickening with negative sonographic Murphy sign. Multiple hyperechoic lesions in  the liver most compatible with hemangiomas. Trace perihepatic ascites. Electronically Signed   By: Rolm Baptise M.D.   On: 03/14/2021 18:30     Medications:    dialysis solution 1.5% low-MG/low-CA     sodium chloride      aspirin EC  81 mg Oral Daily   atorvastatin  40 mg Oral Daily   calcitRIOL  0.5 mcg Oral Daily   calcium carbonate  1,250 mg Oral BID WC   Chlorhexidine Gluconate Cloth  6 each Topical Daily   gentamicin cream  1 application Topical Daily   heparin  5,000 Units Subcutaneous Q8H   nystatin  1 tablet Oral TID   sucroferric oxyhydroxide  1,000 mg Oral TID   Vitamin D  (Ergocalciferol)  50,000 Units Oral Q Sat   acetaminophen, benzonatate, bisacodyl, heparin, ondansetron **OR** ondansetron (ZOFRAN) IV, oxyCODONE  Assessment/ Plan:  66 y.o. male with   end stage renal disease on peritoneal dialysis, hypertension, hyperlipidemia, coronary artery disease status post CABG, history of bladder cancer, COPD with ongoing tobacco use, who is    admitted on 03/14/2021 for Spontaneous bacterial peritonitis ([place]) [K65.2] Generalized abdominal pain [R10.84] Gallbladder sludge [K82.8] Sepsis ([place]) [A41.9] Sepsis, due to unspecified organism, unspecified whether acute organ dysfunction present ([place]) [A41.9]  [facility] PD [place]  #End-stage renal disease on peritoneal dialysis Will continue patient on peritoneal dialysis for now.  He wishes to switch to hemodialysis.  Once acute peritonitis has subsided, will consider placing PermCath.  Patient is right-handed therefore save left arm for future AV access  #Acute peritonitis Patient was admitted last week for the same problem and was discharged on January 16. Patient claims he was taking antibiotics at home as prescribed Currently being treated with IV ceftazidime and vancomycin Will need pharmacy consult for dosing assistance Await fluid cultures Follow PD fluid cell count periodically  #Anemia of chronic kidney disease Lab Results  Component Value Date   HGB 9.2 (L) 03/15/2021  Will resume subcu Epogen weekly while hospitalized.  #Secondary hyperparathyroidism Lab Results  Component Value Date   PTH 159 (H) 12/27/2019   CALCIUM 6.4 (LL) 03/15/2021   CAION 0.74 (LL) 01/20/2021   PHOS 7.4 (H) 03/15/2021  Monitor calcium and phosphorus during this admission Currently hypocalcemic.  We will add calcium supplements and calcitriol     LOS: 1 Jonell Brumbaugh 1/22/202312:18 PM  [place], [place]  Note: This note was prepared with Dragon dictation. Any  transcription errors are unintentional

## 2021-03-15 NOTE — Progress Notes (Signed)
[facility] Progress Note  Patient: Anthony Hess    GNF:[id number]  DOA: 03/14/2021     Date of Service: the patient was seen and examined on 03/15/2021  Chief Complaint  Patient presents with   Abdominal Pain   Brief hospital course: Anthony Hess is a 66 y.o. male with Past medical history of CAD s/p CABG, HTN, A. fib, CHF, ESRD on PD, history of bladder cancer, hyperparathyroidism, anemia of chronic disease, recently discharged on 1/16, admitted due to SBP, antibiotics were given.  Patient was discharged home.  Patient presented back in the ED secondary to worsening of abdominal pain.      ED Course: BP soft 99/81, saturating well on room air, Potassium 3.4, creatinine 8.7, albumin 1.6, lactic acid 2.0, procalcitonin 1.35 WBC 15.7 and 10.0 Respiratory viral panel is pending Blood culture drawn CT abdomen pelvis shows possible enteritis, ascites, pneumoperitoneum due to PD catheter, edema, 3.2 cm AAA, every 3-year ultrasound follow-up recommended Abdominal sonogram pending   Assessment and Plan: Principal Problem:   Sepsis ([place]) Active Problems:   History of bladder cancer   Hypertension   Atrial fibrillation ([place])   ESRD (end stage renal disease) ([place])     Sepsis secondary to SBP Lactic acid 2.0, procalcitonin 1.35, WBC 15.7 hypotension BP 99/81 Patient was started on vancomycin and ceftazidime in the ED which has been continued Peritoneal fluid WBC count 2172 elevated Follow fluid culture Follow blood cultures Pharmacy consulted for dosing and Vanco trough monitoring Consider ID consult for antibiotics and duration of treatment     ESRD on PD, Nephrology was informed by ED physician Peritoneal dialysis will be continued, patient may need hemodialysis, dialysis catheter will be inserted after resolution of infection.     CAD stable CABG, HTN, A. fib, CHF, abdominal aortic aneurysm Recommended ultrasound every 3 years for AAA Held antihypertensive medications  for now due to soft blood pressure Resumed aspirin and statin At home patient was taking hydralazine, irbesartan, and  metoprolol Monitor BP and titrate medications accordingly     History of bladder cancer, s/p transurethral resection. Management per onco outpatient        Body mass index is 21.79 kg/m.  Interventions:       Diet: renal DVT Prophylaxis: Subcutaneous Heparin    Advance goals of care discussion: Full code  Family Communication: family was NOT present at bedside, at the time of interview.  The pt provided permission to discuss medical plan with the family. Opportunity was given to ask question and all questions were answered satisfactorily.   Disposition:  Pt is from Home, admitted with sepsis due to SBP, still has abdominal pain on IV antibiotics, on peritoneal dialysis, may need to switch to hemodialysis, which precludes a safe discharge. Discharge to home, when clinically stable, may require 3 to 5 days.  Subjective: No significant overnight events, patient still has significant abdominal pain 8/10, denies any nausea vomiting, no diarrhea, no chest pain or palpitation, no shortness of breath.  Physical Exam: General:  alert oriented to time, place, and person.  Appear in mild distress, affect appropriate Eyes: PERRLA ENT: Oral Mucosa Clear, moist  Neck: no JVD,  Cardiovascular: S1 and S2 Present, no Murmur,  Respiratory: good respiratory effort, Bilateral Air entry equal and Decreased, no Crackles, no wheezes Abdomen: Bowel Sound present, Soft and no tenderness,  Skin: no rashes Extremities: no Pedal edema, no calf tenderness Neurologic: without any new focal findings Gait not checked due to patient safety concerns  Vitals:  03/14/21 1533 03/14/21 2107 03/15/21 0354 03/15/21 0733  BP: 99/81 (!) 139/100 117/89 (!) 122/95  Pulse: 82 88 93 92  Resp: 18 18 20 18   Temp: 97.8 F (36.6 C) 99.5 F (37.5 C) 99.1 F (37.3 C) 98.1 F (36.7 C)  TempSrc:  Oral Oral Oral Oral  SpO2: 97% 98% 94% 94%  Weight:  55.8 kg    Height:  5\' 3"  (1.6 m)      Intake/Output Summary (Last 24 hours) at 03/15/2021 1431 Last data filed at 03/15/2021 1010 Gross per 24 hour  Intake 10600 ml  Output 10274 ml  Net 326 ml   Filed Weights   03/14/21 1022 03/14/21 2107  Weight: 62.1 kg 55.8 kg    Data Reviewed: I have personally reviewed and interpreted daily labs, tele strips, imagings as discussed above. I reviewed all nursing notes, pharmacy notes, vitals, pertinent old records I have discussed plan of care as described above with RN and patient/family.  CBC: Recent Labs  Lab 03/09/21 0539 03/14/21 1025 03/14/21 2112 03/15/21 0808  WBC 12.6* 15.7* 16.4* 14.1*  NEUTROABS  --   --  13.9*  --   HGB 9.2* 10.0* 10.2* 9.2*  HCT 29.5* 32.7* 32.3* 29.4*  MCV 84.5 87.7 86.6 86.2  PLT 520* 573* 590* 416*   Basic Metabolic Panel: Recent Labs  Lab 03/09/21 0539 03/14/21 1025 03/14/21 2120 03/15/21 0808  NA 135 138  --  138  K 3.4* 3.4*  --  3.5  CL 98 97*  --  96*  CO2 23 28  --  25  GLUCOSE 116* 106*  --  98  BUN 82* 71*  --  72*  CREATININE 9.60* 8.74* 9.40* 8.24*  CALCIUM 7.1* 7.0*  --  6.4*  MG 1.9  --   --  1.7  PHOS  --   --   --  7.4*    Studies: DG Chest Port 1 View  Result Date: 03/14/2021 CLINICAL DATA:  Question sepsis EXAM: PORTABLE CHEST 1 VIEW COMPARISON:  03/04/2021 FINDINGS: Postop CABG. Heart size and vascularity normal. Negative for edema or effusion Mild bibasilar atelectasis left greater than right has developed since the prior study. IMPRESSION: Mild bibasilar atelectasis. Electronically Signed   By: Franchot Gallo M.D.   On: 03/14/2021 17:46   US Abdomen Limited RUQ (LIVER/GB)  Result Date: 03/14/2021 CLINICAL DATA:  Gallbladder sludge EXAM: ULTRASOUND ABDOMEN LIMITED RIGHT UPPER QUADRANT COMPARISON:  CT today FINDINGS: Gallbladder: Small layering stones within the gallbladder. Gallbladder wall is borderline thickened  at 4 mm. Negative sonographic Murphy's. Common bile duct: Diameter: Normal caliber, 5 mm Liver: Multiple hyperechoic lesions within the liver most compatible with hemangiomas, the largest in the left hepatic lobe measuring up to 2.6 cm. Normal echotexture otherwise. No biliary ductal dilatation. Portal vein is patent on color Doppler imaging with normal direction of blood flow towards the liver. Other: Small amount of ascites adjacent to the liver edge. IMPRESSION: Small layering gallstones. Slight gallbladder wall thickening with negative sonographic Murphy sign. Multiple hyperechoic lesions in the liver most compatible with hemangiomas. Trace perihepatic ascites. Electronically Signed   By: Rolm Baptise M.D.   On: 03/14/2021 18:30    Scheduled Meds:  aspirin EC  81 mg Oral Daily   atorvastatin  40 mg Oral Daily   calcitRIOL  0.5 mcg Oral Daily   calcium carbonate  1,250 mg Oral BID WC   Chlorhexidine Gluconate Cloth  6 each Topical Daily   gentamicin cream  1 application Topical Daily   heparin  5,000 Units Subcutaneous Q8H   nystatin  1 tablet Oral TID   Ensure Max Protein  11 oz Oral BID   sucroferric oxyhydroxide  1,000 mg Oral TID   Vitamin D (Ergocalciferol)  50,000 Units Oral Q Sat   Continuous Infusions:  dialysis solution 1.5% low-MG/low-CA     sodium chloride     PRN Meds: acetaminophen, benzonatate, bisacodyl, heparin, ondansetron **OR** ondansetron (ZOFRAN) IV, oxyCODONE  Time spent: 35 minutes  Author: Val Riles. MD [facility] 03/15/2021 2:31 PM  To reach On-call, see care teams to locate the attending and reach out to them via [email or web address]. If 7PM-7AM, please contact night-coverage If you still have difficulty reaching the attending provider, please page the [facility] (Director on Call) for [facility] on amion for assistance.

## 2021-03-15 NOTE — Progress Notes (Signed)
The assistance number was called for the Peritoneal Dialysis Machine because it was beeping. Erline Levine, Charge Nurse was on the call for about 30 minutes. The assistant was unsuccessful in helping [place] with the problem. She said clamp all the lines, turn off the machine and call HD in the morning. I did.

## 2021-03-16 LAB — BASIC METABOLIC PANEL
Anion gap: 13 (ref 5–15)
BUN: 71 mg/dL — ABNORMAL HIGH (ref 8–23)
CO2: 23 mmol/L (ref 22–32)
Calcium: 6.3 mg/dL — CL (ref 8.9–10.3)
Chloride: 101 mmol/L (ref 98–111)
Creatinine, Ser: 8.05 mg/dL — ABNORMAL HIGH (ref 0.61–1.24)
GFR, Estimated: 7 mL/min — ABNORMAL LOW (ref >60)
Glucose, Bld: 111 mg/dL — ABNORMAL HIGH (ref 70–99)
Potassium: 3 mmol/L — ABNORMAL LOW (ref 3.5–5.1)
Sodium: 137 mmol/L (ref 135–145)

## 2021-03-16 LAB — PHOSPHORUS: Phosphorus: 6.8 mg/dL — ABNORMAL HIGH (ref 2.5–4.6)

## 2021-03-16 LAB — CBC
HCT: 28.8 % — ABNORMAL LOW (ref 39.0–52.0)
Hemoglobin: 8.9 g/dL — ABNORMAL LOW (ref 13.0–17.0)
MCH: 26.3 pg (ref 26.0–34.0)
MCHC: 30.9 g/dL (ref 30.0–36.0)
MCV: 85.2 fL (ref 80.0–100.0)
Platelets: 493 10*3/uL — ABNORMAL HIGH (ref 150–400)
RBC: 3.38 MIL/uL — ABNORMAL LOW (ref 4.22–5.81)
RDW: 15.6 % — ABNORMAL HIGH (ref 11.5–15.5)
WBC: 12.4 10*3/uL — ABNORMAL HIGH (ref 4.0–10.5)
nRBC: 0 % (ref 0.0–0.2)

## 2021-03-16 LAB — PROCALCITONIN: Procalcitonin: 1.16 ng/mL

## 2021-03-16 LAB — MAGNESIUM: Magnesium: 1.5 mg/dL — ABNORMAL LOW (ref 1.7–2.4)

## 2021-03-16 MED ORDER — HEPARIN SODIUM (PORCINE) 1000 UNIT/ML IJ SOLN
INTRAMUSCULAR | Status: AC
  Filled 2021-03-16: qty 10

## 2021-03-16 MED ORDER — MAGNESIUM OXIDE -MG SUPPLEMENT 400 (240 MG) MG PO TABS
400.0000 mg | ORAL_TABLET | Freq: Every day | ORAL | Status: DC
  Administered 2021-03-16 – 2021-03-24 (×9): 400 mg via ORAL
  Filled 2021-03-16 (×9): qty 1

## 2021-03-16 MED ORDER — FENTANYL CITRATE PF 50 MCG/ML IJ SOSY
12.5000 ug | PREFILLED_SYRINGE | Freq: Once | INTRAMUSCULAR | Status: AC
  Administered 2021-03-16: 12.5 ug via INTRAVENOUS
  Filled 2021-03-16: qty 1

## 2021-03-16 MED ORDER — METOPROLOL SUCCINATE ER 50 MG PO TB24
50.0000 mg | ORAL_TABLET | Freq: Every day | ORAL | Status: DC
  Administered 2021-03-16 – 2021-03-24 (×8): 50 mg via ORAL
  Filled 2021-03-16 (×9): qty 1

## 2021-03-16 MED ORDER — POTASSIUM CHLORIDE CRYS ER 20 MEQ PO TBCR
40.0000 meq | EXTENDED_RELEASE_TABLET | Freq: Once | ORAL | Status: AC
  Administered 2021-03-16: 40 meq via ORAL
  Filled 2021-03-16: qty 2

## 2021-03-16 MED ORDER — SODIUM CHLORIDE 0.9 % IV SOLN
1.0000 g | INTRAVENOUS | Status: DC
  Administered 2021-03-16 – 2021-03-22 (×7): 1 g via INTRAVENOUS
  Filled 2021-03-16 (×9): qty 1

## 2021-03-16 MED ORDER — EPOETIN ALFA 10000 UNIT/ML IJ SOLN
10000.0000 [IU] | INTRAMUSCULAR | Status: DC
  Administered 2021-03-16 – 2021-03-23 (×2): 10000 [IU] via SUBCUTANEOUS
  Filled 2021-03-16: qty 1

## 2021-03-16 MED ORDER — POTASSIUM CHLORIDE CRYS ER 20 MEQ PO TBCR
20.0000 meq | EXTENDED_RELEASE_TABLET | Freq: Every day | ORAL | Status: DC
  Administered 2021-03-17 – 2021-03-24 (×8): 20 meq via ORAL
  Filled 2021-03-16 (×9): qty 1

## 2021-03-16 NOTE — Progress Notes (Signed)
[facility] Progress Note  Patient: Anthony Hess    ZTI:[id number]  DOA: 03/14/2021     Date of Service: the patient was seen and examined on 03/16/2021  Chief Complaint  Patient presents with   Abdominal Pain   Brief hospital course: Anthony Hess is a 65 y.o. male with Past medical history of CAD s/p CABG, HTN, A. fib, CHF, ESRD on PD, history of bladder cancer, hyperparathyroidism, anemia of chronic disease, recently discharged on 1/16, admitted due to SBP, antibiotics were given.  Patient was discharged home.  Patient presented back in the ED secondary to worsening of abdominal pain.      ED Course: BP soft 99/81, saturating well on room air, Potassium 3.4, creatinine 8.7, albumin 1.6, lactic acid 2.0, procalcitonin 1.35 WBC 15.7 and 10.0 Respiratory viral panel is pending Blood culture drawn CT abdomen pelvis shows possible enteritis, ascites, pneumoperitoneum due to PD catheter, edema, 3.2 cm AAA, every 3-year ultrasound follow-up recommended Abdominal sonogram pending   Assessment and Plan: Principal Problem:   Sepsis ([place]) Active Problems:   History of bladder cancer   Hypertension   Atrial fibrillation ([place])   ESRD (end stage renal disease) ([place])     Sepsis secondary to SBP Lactic acid 2.0, procalcitonin 1.35, WBC 15.7 hypotension BP 99/81 Patient was started on vancomycin and ceftazidime in the ED which has been continued Peritoneal fluid WBC count 2172 elevated Follow fluid culture Follow blood cultures Pharmacy consulted for dosing and Vanco trough monitoring Consider ID consult for antibiotics and duration of treatment     ESRD on PD, Nephrology was informed by ED physician Peritoneal dialysis will be continued, patient may need hemodialysis, dialysis catheter will be inserted after resolution of infection.  Hypokalemia, potassium repleted by nephro, monitor and replete as needed. Hypomagnesemia, mag repleted, monitor and replete as needed.    CAD  stable CABG, HTN, A. fib, CHF, abdominal aortic aneurysm Recommended ultrasound every 3 years for AAA Held antihypertensive medications for now due to soft blood pressure Resumed aspirin and statin At home patient was taking hydralazine, irbesartan, and  metoprolol Monitor BP and titrate medications accordingly     History of bladder cancer, s/p transurethral resection. Management per onco outpatient        Body mass index is 21.79 kg/m.  Interventions:       Diet: renal DVT Prophylaxis: Subcutaneous Heparin    Advance goals of care discussion: Full code  Family Communication: family was NOT present at bedside, at the time of interview.  The pt provided permission to discuss medical plan with the family. Opportunity was given to ask question and all questions were answered satisfactorily.   Disposition:  Pt is from Home, admitted with sepsis due to SBP, still has abdominal pain on IV antibiotics, on peritoneal dialysis, may need to switch to hemodialysis, which precludes a safe discharge. Discharge to home, when clinically stable, may require 3 to 5 days.  Subjective: No significant overnight events, patient still has significant abdominal pain 8/10, denies any nausea vomiting, no diarrhea, no chest pain or palpitation, no shortness of breath.  Physical Exam: General:  alert oriented to time, place, and person.  Appear in mild distress, affect appropriate Eyes: PERRLA ENT: Oral Mucosa Clear, moist  Neck: no JVD,  Cardiovascular: S1 and S2 Present, no Murmur,  Respiratory: good respiratory effort, Bilateral Air entry equal and Decreased, no Crackles, no wheezes Abdomen: Bowel Sound present, Soft and generalized tenderness, PD catheter intact Skin: no rashes Extremities: no Pedal  edema, no calf tenderness Neurologic: without any new focal findings Gait not checked due to patient safety concerns  Vitals:   03/15/21 1943 03/16/21 0409 03/16/21 0502 03/16/21 0739  BP: 117/88  (!) 153/108  (!) 141/98  Pulse: 98 88  86  Resp: 15 16  18   Temp: 98.8 F (37.1 C) 98.2 F (36.8 C)  98.6 F (37 C)  TempSrc: Oral     SpO2: 96% 95%  92%  Weight:   57.3 kg   Height:        Intake/Output Summary (Last 24 hours) at 03/16/2021 1503 Last data filed at 03/16/2021 1419 Gross per 24 hour  Intake 420 ml  Output --  Net 420 ml   Filed Weights   03/14/21 1022 03/14/21 2107 03/16/21 0502  Weight: 62.1 kg 55.8 kg 57.3 kg    Data Reviewed: I have personally reviewed and interpreted daily labs, tele strips, imagings as discussed above. I reviewed all nursing notes, pharmacy notes, vitals, pertinent old records I have discussed plan of care as described above with RN and patient/family.  CBC: Recent Labs  Lab 03/14/21 1025 03/14/21 2112 03/15/21 0808 03/16/21 0400  WBC 15.7* 16.4* 14.1* 12.4*  NEUTROABS  --  13.9*  --   --   HGB 10.0* 10.2* 9.2* 8.9*  HCT 32.7* 32.3* 29.4* 28.8*  MCV 87.7 86.6 86.2 85.2  PLT 573* 590* 538* 935*   Basic Metabolic Panel: Recent Labs  Lab 03/14/21 1025 03/14/21 2120 03/15/21 0808 03/16/21 0400  NA 138  --  138 137  K 3.4*  --  3.5 3.0*  CL 97*  --  96* 101  CO2 28  --  25 23  GLUCOSE 106*  --  98 111*  BUN 71*  --  72* 71*  CREATININE 8.74* 9.40* 8.24* 8.05*  CALCIUM 7.0*  --  6.4* 6.3*  MG  --   --  1.7 1.5*  PHOS  --   --  7.4* 6.8*    Studies: No results found.  Scheduled Meds:  aspirin EC  81 mg Oral Daily   atorvastatin  40 mg Oral Daily   calcitRIOL  0.5 mcg Oral Daily   calcium carbonate  1,250 mg Oral BID WC   Chlorhexidine Gluconate Cloth  6 each Topical Daily   gentamicin cream  1 application Topical Daily   heparin  5,000 Units Subcutaneous Q8H   magnesium oxide  400 mg Oral Daily   metoprolol succinate  50 mg Oral Daily   nystatin  5 mL Oral TID   [START ON 03/17/2021] potassium chloride  20 mEq Oral Daily   Ensure Max Protein  11 oz Oral BID   sucroferric oxyhydroxide  1,000 mg Oral TID    Vitamin D (Ergocalciferol)  50,000 Units Oral Q Sat   Continuous Infusions:  cefTAZidime (FORTAZ)  IV 1 g (03/16/21 1141)   dialysis solution 1.5% low-MG/low-CA     sodium chloride     PRN Meds: acetaminophen, benzonatate, bisacodyl, heparin, ondansetron **OR** ondansetron (ZOFRAN) IV, oxyCODONE  Time spent: 35 minutes  Author: Val Riles. MD [facility] 03/16/2021 3:03 PM  To reach On-call, see care teams to locate the attending and reach out to them via [email or web address]. If 7PM-7AM, please contact night-coverage If you still have difficulty reaching the attending provider, please page the [facility] (Director on Call) for [facility] on amion for assistance.

## 2021-03-16 NOTE — Progress Notes (Signed)
Pharmacy Antibiotic Note  Anthony Hess is a 66 y.o. male w/ PMH of CAD s/p CABG, HTN, A. fib, CHF, ESRD on PD, history of bladder cancer, hyperparathyroidism, anemia of chronic disease admitted on 03/14/2021 with SBP.  Pharmacy has been consulted for vancomycin and ceftazidime dosing. On admission he received 1250 mg IV vancomycin and 1000 mg IV ceftazidime.  Plan:  1) will order a vancomycin level with am labs 01/24 Goal vancomycin level 15 - 20 mcg/mL  2) start ceftazidime 1 gram IV every 24 hours   Height: 5\' 3"  (160 cm) Weight: 57.3 kg (126 lb 5.2 oz) IBW/kg (Calculated) : 56.9  Temp (24hrs), Avg:98.5 F (36.9 C), Min:98.2 F (36.8 C), Max:98.8 F (37.1 C)  Recent Labs  Lab 03/14/21 1025 03/14/21 2112 03/14/21 2120 03/15/21 0808 03/16/21 0400  WBC 15.7* 16.4*  --  14.1* 12.4*  CREATININE 8.74*  --  9.40* 8.24* 8.05*  LATICACIDVEN 2.0* 1.3  --   --   --     Estimated Creatinine Clearance: 7.6 mL/min (A) (by C-G formula based on SCr of 8.05 mg/dL (H)).    No Known Allergies  Antimicrobials this admission: 01/21 ceftazidime >>  01/21 vancomycin >>   Microbiology results: 01/21 BCx: NGD 01/21 Wcx: NGTD 01/21 UCx: pending 01/21 SARS CoV-2: negative 01/21 influenza A/B: negative    Thank you for allowing pharmacy to be a part of this patients care.  Dallie Piles 03/16/2021 9:38 AM

## 2021-03-16 NOTE — TOC Initial Note (Signed)
Transition of Care [facility]) - Initial/Assessment Note    Patient Details  Name: Anthony Hess MRN: [id number] Date of Birth: [birth year]  [facility]) CM/SW Contact:    Beverly Sessions, RN Phone Number: 03/16/2021, 2:18 PM  Clinical Narrative:                  Admitted for: sepsis Admitted from: Home with wife  PCP: No PCP.  Patient states he is agreeable to new patient appointment and does not have a preference of practice.  New patient appointment made for 2/6 and added to AVS [facility]  Current home health/prior home health/DME:No DME,  independent per patient   Elvera Bicker dialysis liaison notified of admission.     Expected Discharge Plan: Home/Self Care Barriers to Discharge: Continued Medical Work up   Patient Goals and CMS Choice        Expected Discharge Plan and Services Expected Discharge Plan: Home/Self Care       Living arrangements for the past 2 months: Single Family Home                                      Prior Living Arrangements/Services Living arrangements for the past 2 months: Single Family Home Lives with:: Spouse Patient language and need for interpreter reviewed:: Yes Do you feel safe going back to the place where you live?: Yes      Need for Family Participation in Patient Care: Yes (Comment) Care giver support system in place?: Yes (comment)   Criminal Activity/Legal Involvement Pertinent to Current Situation/Hospitalization: No - Comment as needed  Activities of Daily Living Home Assistive Devices/Equipment: None ADL Screening (condition at time of admission) Patient's cognitive ability adequate to safely complete daily activities?: Yes Is the patient deaf or have difficulty hearing?: No Does the patient have difficulty seeing, even when wearing glasses/contacts?: No Does the patient have difficulty concentrating, remembering, or making decisions?: No Patient able to express need for assistance with  ADLs?: Yes Does the patient have difficulty dressing or bathing?: No Independently performs ADLs?: Yes (appropriate for developmental age) Does the patient have difficulty walking or climbing stairs?: No Weakness of Legs: Both Weakness of Arms/Hands: None  Permission Sought/Granted                  Emotional Assessment Appearance:: Appears stated age     Orientation: : Oriented to Self, Oriented to Place, Oriented to  Time, Oriented to Situation Alcohol / Substance Use: Not Applicable Psych Involvement: No (comment)  Admission diagnosis:  Spontaneous bacterial peritonitis ([place]) [K65.2] Generalized abdominal pain [R10.84] Gallbladder sludge [K82.8] Sepsis ([place]) [A41.9] Sepsis, due to unspecified organism, unspecified whether acute organ dysfunction present [facility]) [A41.9] Patient Active Problem List   Diagnosis Date Noted   Sepsis ([place]) 03/14/2021   Hypocalcemia    Leukocytosis 03/05/2021   Spontaneous bacterial peritonitis ([place]) 03/04/2021   Incisional hernia, without obstruction or gangrene    PD catheter dysfunction ([place]) 04/13/2020   Chronic kidney disease due to hypertension 01/30/2020   Hyperparathyroidism due to renal insufficiency ([place]) 01/30/2020   Acute peritonitis ([place]) 01/08/2020   Hypotension 01/04/2020   Anemia in ESRD (end-stage renal disease) ([place]) 01/04/2020   Hyperlipidemia 01/04/2020   Mass of left side of neck 01/04/2020   Senile purpura ([place]) 01/04/2020   Chronic HFrEF (heart failure with reduced ejection fraction) ([place])    Hydroureteronephrosis  Atrial fibrillation ([place]) 01/05/2019   ESRD (end stage renal disease) ([place]) 01/05/2019   Anemia in chronic kidney disease ([place]) 01/05/2019   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   Presence of aortocoronary bypass graft 12/27/2018   Emphysema lung ([place]) 04/14/2018   Bilateral hydronephrosis 04/03/2018   Hypertension 03/27/2018   Cigarette smoker 03/20/2018   History of bladder cancer 03/20/2018   PCP:  Merryl Hacker,  No Pharmacy:   [place], [place] [address] [place] [address] Phone: [phone] Fax: [phone]  [place] [address], [place] [address] Phone: [phone] Fax: [phone]     Social Determinants of Health ([place]) Interventions    Readmission Risk Interventions Readmission Risk Prevention Plan 03/16/2021 01/09/2020  Transportation Screening Complete Complete  PCP or Specialist Appt within 3-5 Days - Complete  Social Work Consult for [place] Planning/Counseling Complete Complete  Palliative Care Screening Not Applicable Not Applicable  Medication Review Press photographer) Complete Complete  Some recent data might be hidden

## 2021-03-16 NOTE — Progress Notes (Signed)
[facility], [place] 03/16/21  Subjective:   Hospital day # 2  Patient seen resting in bed Alert and oriented Continues to complain of abdominal pain Denies shortness of breath   Objective:  Vital signs in last 24 hours:  Temp:  [98.2 F (36.8 C)-98.8 F (37.1 C)] 98.6 F (37 C) (01/23 0739) Pulse Rate:  [85-98] 86 (01/23 0739) Resp:  [15-20] 18 (01/23 0739) BP: (116-153)/(88-108) 141/98 (01/23 0739) SpO2:  [92 %-96 %] 92 % (01/23 0739) Weight:  [57.3 kg] 57.3 kg (01/23 0502)  Weight change: -4.843 kg Filed Weights   03/14/21 1022 03/14/21 2107 03/16/21 0502  Weight: 62.1 kg 55.8 kg 57.3 kg    Intake/Output:    Intake/Output Summary (Last 24 hours) at 03/16/2021 1354 Last data filed at 03/16/2021 1008 Gross per 24 hour  Intake 240 ml  Output --  Net 240 ml     Physical Exam: General:  No acute distress, laying in the bed  HEENT  anicteric, moist oral mucous membrane  Pulm/lungs  normal breathing effort, lungs are clear to auscultation  CVS/Heart  regular rhythm, no rub or gallop  Abdomen:   Soft, diffusely tender, PD cathter in place  Extremities:  No peripheral edema  Neurologic:  Alert, oriented, able to follow commands  Skin:  No acute rashes       Basic Metabolic Panel:  Recent Labs  Lab 03/14/21 1025 03/14/21 2120 03/15/21 0808 03/16/21 0400  NA 138  --  138 137  K 3.4*  --  3.5 3.0*  CL 97*  --  96* 101  CO2 28  --  25 23  GLUCOSE 106*  --  98 111*  BUN 71*  --  72* 71*  CREATININE 8.74* 9.40* 8.24* 8.05*  CALCIUM 7.0*  --  6.4* 6.3*  MG  --   --  1.7 1.5*  PHOS  --   --  7.4* 6.8*      CBC: Recent Labs  Lab 03/14/21 1025 03/14/21 2112 03/15/21 0808 03/16/21 0400  WBC 15.7* 16.4* 14.1* 12.4*  NEUTROABS  --  13.9*  --   --   HGB 10.0* 10.2* 9.2* 8.9*  HCT 32.7* 32.3* 29.4* 28.8*  MCV 87.7 86.6 86.2 85.2  PLT 573* 590* 538* 493*       Lab Results  Component Value Date   HEPBSAG NON REACTIVE  12/27/2019   HEPBSAB NON REACTIVE 12/27/2019   HEPBIGM NON REACTIVE 12/27/2019      Microbiology:  Recent Results (from the past 240 hour(s))  Resp Panel by RT-PCR (Flu A&B, Covid) Nasopharyngeal Swab     Status: None   Collection Time: 03/14/21  5:14 PM   Specimen: Nasopharyngeal Swab; Nasopharyngeal(NP) swabs in vial transport medium  Result Value Ref Range Status   SARS Coronavirus 2 by RT PCR NEGATIVE NEGATIVE Final    Comment: (NOTE) SARS-CoV-2 target nucleic acids are NOT DETECTED.  The SARS-CoV-2 RNA is generally detectable in upper respiratory specimens during the acute phase of infection. The lowest concentration of SARS-CoV-2 viral copies this assay can detect is 138 copies/mL. A negative result does not preclude SARS-Cov-2 infection and should not be used as the sole basis for treatment or other patient management decisions. A negative result may occur with  improper specimen collection/handling, submission of specimen other than nasopharyngeal swab, presence of viral mutation(s) within the areas targeted by this assay, and inadequate number of viral copies(<138 copies/mL). A negative result must be combined with clinical  observations, patient history, and epidemiological information. The expected result is Negative.  Fact Sheet for Patients:  EntrepreneurPulse.com.au  Fact Sheet for Healthcare Providers:  IncredibleEmployment.be  This test is no t yet approved or cleared by the [place] FDA and  has been authorized for detection and/or diagnosis of SARS-CoV-2 by FDA under an Emergency Use Authorization (EUA). This EUA will remain  in effect (meaning this test can be used) for the duration of the COVID-19 declaration under Section 564(b)(1) of the Act, 21 U.S.C.section 360bbb-3(b)(1), unless the authorization is terminated  or revoked sooner.       Influenza A by PCR NEGATIVE NEGATIVE Final   Influenza B by PCR  NEGATIVE NEGATIVE Final    Comment: (NOTE) The Xpert Xpress SARS-CoV-2/FLU/RSV plus assay is intended as an aid in the diagnosis of influenza from Nasopharyngeal swab specimens and should not be used as a sole basis for treatment. Nasal washings and aspirates are unacceptable for Xpert Xpress SARS-CoV-2/FLU/RSV testing.  Fact Sheet for Patients: EntrepreneurPulse.com.au  Fact Sheet for Healthcare Providers: IncredibleEmployment.be  This test is not yet approved or cleared by the [place] FDA and has been authorized for detection and/or diagnosis of SARS-CoV-2 by FDA under an Emergency Use Authorization (EUA). This EUA will remain in effect (meaning this test can be used) for the duration of the COVID-19 declaration under Section 564(b)(1) of the Act, 21 U.S.C. section 360bbb-3(b)(1), unless the authorization is terminated or revoked.  Performed at [facility], [address] [place], [place] [address]   Blood Culture (routine x 2)     Status: None (Preliminary result)   Collection Time: 03/14/21  5:14 PM   Specimen: BLOOD  Result Value Ref Range Status   Specimen Description BLOOD RIGHT ANTECUBITAL  Final   Special Requests   Final    BOTTLES DRAWN AEROBIC AND ANAEROBIC Blood Culture adequate volume   Culture   Final    NO GROWTH 2 DAYS Performed at [facility], [address] [place], [place] [address]    Report Status PENDING  Incomplete  Body fluid culture w Gram Stain     Status: None (Preliminary result)   Collection Time: 03/14/21  7:19 PM   Specimen: Peritoneal Dialysate; Body Fluid  Result Value Ref Range Status   Specimen Description   Final    PERITONEAL DIALYSATE Performed at [facility], [address] [place], [place] [address]    Special Requests   Final    Normal Performed at [facility], [address] [place], [place] [address]    Gram Stain   Final    MODERATE  WBC PRESENT,BOTH PMN AND MONONUCLEAR NO ORGANISMS SEEN    Culture   Final    NO GROWTH 1 DAY Performed at [facility], [place] [address] [place], [place] [address]    Report Status PENDING  Incomplete  Blood Culture (routine x 2)     Status: None (Preliminary result)   Collection Time: 03/14/21  9:12 PM   Specimen: BLOOD  Result Value Ref Range Status   Specimen Description BLOOD RIGHT ANTECUBITAL  Final   Special Requests   Final    BOTTLES DRAWN AEROBIC AND ANAEROBIC Blood Culture adequate volume   Culture   Final    NO GROWTH 2 DAYS Performed at [facility], [address], [place], [place] [address]    Report Status PENDING  Incomplete    Coagulation Studies: Recent Labs    03/14/21  2112  LABPROT 20.3*  INR 1.7*     Urinalysis: Recent Labs    03/14/21 1720  COLORURINE YELLOW*  LABSPEC 1.015  PHURINE 5.0  GLUCOSEU NEGATIVE  HGBUR SMALL*  BILIRUBINUR NEGATIVE  KETONESUR NEGATIVE  PROTEINUR 30*  NITRITE NEGATIVE  LEUKOCYTESUR NEGATIVE       Imaging: DG Chest Port 1 View  Result Date: 03/14/2021 CLINICAL DATA:  Question sepsis EXAM: PORTABLE CHEST 1 VIEW COMPARISON:  03/04/2021 FINDINGS: Postop CABG. Heart size and vascularity normal. Negative for edema or effusion Mild bibasilar atelectasis left greater than right has developed since the prior study. IMPRESSION: Mild bibasilar atelectasis. Electronically Signed   By: Franchot Gallo M.D.   On: 03/14/2021 17:46   US Abdomen Limited RUQ (LIVER/GB)  Result Date: 03/14/2021 CLINICAL DATA:  Gallbladder sludge EXAM: ULTRASOUND ABDOMEN LIMITED RIGHT UPPER QUADRANT COMPARISON:  CT today FINDINGS: Gallbladder: Small layering stones within the gallbladder. Gallbladder wall is borderline thickened at 4 mm. Negative sonographic Murphy's. Common bile duct: Diameter: Normal caliber, 5 mm Liver: Multiple hyperechoic lesions within the liver most compatible with hemangiomas, the largest in the left hepatic lobe  measuring up to 2.6 cm. Normal echotexture otherwise. No biliary ductal dilatation. Portal vein is patent on color Doppler imaging with normal direction of blood flow towards the liver. Other: Small amount of ascites adjacent to the liver edge. IMPRESSION: Small layering gallstones. Slight gallbladder wall thickening with negative sonographic Murphy sign. Multiple hyperechoic lesions in the liver most compatible with hemangiomas. Trace perihepatic ascites. Electronically Signed   By: Rolm Baptise M.D.   On: 03/14/2021 18:30     Medications:    cefTAZidime (FORTAZ)  IV 1 g (03/16/21 1141)   dialysis solution 1.5% low-MG/low-CA     sodium chloride      aspirin EC  81 mg Oral Daily   atorvastatin  40 mg Oral Daily   calcitRIOL  0.5 mcg Oral Daily   calcium carbonate  1,250 mg Oral BID WC   Chlorhexidine Gluconate Cloth  6 each Topical Daily   gentamicin cream  1 application Topical Daily   heparin  5,000 Units Subcutaneous Q8H   magnesium oxide  400 mg Oral Daily   metoprolol succinate  50 mg Oral Daily   nystatin  5 mL Oral TID   [START ON 03/17/2021] potassium chloride  20 mEq Oral Daily   Ensure Max Protein  11 oz Oral BID   sucroferric oxyhydroxide  1,000 mg Oral TID   Vitamin D (Ergocalciferol)  50,000 Units Oral Q Sat   acetaminophen, benzonatate, bisacodyl, heparin, ondansetron **OR** ondansetron (ZOFRAN) IV, oxyCODONE  Assessment/ Plan:  66 y.o. male with   end stage renal disease on peritoneal dialysis, hypertension, hyperlipidemia, coronary artery disease status post CABG, history of bladder cancer, COPD with ongoing tobacco use, who is    admitted on 03/14/2021 for Spontaneous bacterial peritonitis ([place]) [K65.2] Generalized abdominal pain [R10.84] Gallbladder sludge [K82.8] Sepsis ([place]) [A41.9] Sepsis, due to unspecified organism, unspecified whether acute organ dysfunction present ([place]) [A41.9]  [facility] PD [place]  #End-stage renal disease on peritoneal  dialysis Will continue peritoneal dialysis and transition to hemodialysis once adequate antibiotic therapy is completed. Will continue nightly treatments.   #Acute peritonitis Patient was admitted last week for the same problem and was discharged on January 16. Patient claims he was taking antibiotics at home as prescribed Currently being treated with IV ceftazidime and vancomycin Will need pharmacy consult for dosing assistance Still awaiting fluid cultures Follow PD  fluid cell count periodically  #Anemia of chronic kidney disease Lab Results  Component Value Date   HGB 8.9 (L) 03/16/2021  Resume subcu Epogen weekly while hospitalized.  #Secondary hyperparathyroidism Lab Results  Component Value Date   PTH 159 (H) 12/27/2019   CALCIUM 6.3 (LL) 03/16/2021   CAION 0.74 (LL) 01/20/2021   PHOS 6.8 (H) 03/16/2021  Monitor calcium and phosphorus during this admission Currently hypocalcemic.  We will add calcium supplements and calcitriol    LOS: 2 Colon [place] 1/23/20231:54 PM  [place], [place]  Note: This note was prepared with Dragon dictation. Any transcription errors are unintentional

## 2021-03-17 DIAGNOSIS — K659 Peritonitis, unspecified: Secondary | ICD-10-CM

## 2021-03-17 DIAGNOSIS — I10 Essential (primary) hypertension: Secondary | ICD-10-CM

## 2021-03-17 DIAGNOSIS — Z8551 Personal history of malignant neoplasm of bladder: Secondary | ICD-10-CM

## 2021-03-17 DIAGNOSIS — N186 End stage renal disease: Secondary | ICD-10-CM

## 2021-03-17 DIAGNOSIS — T8571XD Infection and inflammatory reaction due to peritoneal dialysis catheter, subsequent encounter: Secondary | ICD-10-CM

## 2021-03-17 LAB — VANCOMYCIN, RANDOM: Vancomycin Rm: 28

## 2021-03-17 LAB — CBC
HCT: 29.5 % — ABNORMAL LOW (ref 39.0–52.0)
Hemoglobin: 8.9 g/dL — ABNORMAL LOW (ref 13.0–17.0)
MCH: 26.1 pg (ref 26.0–34.0)
MCHC: 30.2 g/dL (ref 30.0–36.0)
MCV: 86.5 fL (ref 80.0–100.0)
Platelets: 467 10*3/uL — ABNORMAL HIGH (ref 150–400)
RBC: 3.41 MIL/uL — ABNORMAL LOW (ref 4.22–5.81)
RDW: 15.7 % — ABNORMAL HIGH (ref 11.5–15.5)
WBC: 14.5 10*3/uL — ABNORMAL HIGH (ref 4.0–10.5)
nRBC: 0 % (ref 0.0–0.2)

## 2021-03-17 LAB — URINE CULTURE: Culture: <10000 — AB

## 2021-03-17 LAB — BASIC METABOLIC PANEL
Anion gap: 13 (ref 5–15)
BUN: 72 mg/dL — ABNORMAL HIGH (ref 8–23)
CO2: 26 mmol/L (ref 22–32)
Calcium: 6.7 mg/dL — ABNORMAL LOW (ref 8.9–10.3)
Chloride: 99 mmol/L (ref 98–111)
Creatinine, Ser: 8.15 mg/dL — ABNORMAL HIGH (ref 0.61–1.24)
GFR, Estimated: 7 mL/min — ABNORMAL LOW (ref >60)
Glucose, Bld: 122 mg/dL — ABNORMAL HIGH (ref 70–99)
Potassium: 3.5 mmol/L (ref 3.5–5.1)
Sodium: 138 mmol/L (ref 135–145)

## 2021-03-17 LAB — PHOSPHORUS: Phosphorus: 7.1 mg/dL — ABNORMAL HIGH (ref 2.5–4.6)

## 2021-03-17 LAB — MAGNESIUM: Magnesium: 1.7 mg/dL (ref 1.7–2.4)

## 2021-03-17 LAB — CALCIUM, IONIZED: Calcium, Ionized, Serum: 3.6 mg/dL — ABNORMAL LOW (ref 4.5–5.6)

## 2021-03-17 NOTE — Progress Notes (Addendum)
[place], [place] 03/17/21  Subjective:   Hospital day # 3  Patient seen resting in bed Continues to complain of abdominal pain, states it was excruciating overnight.  Prevented patient from standing. Denies shortness of breath  Received scheduled PD treatment overnight, tolerated well    Objective:  Vital signs in last 24 hours:  Temp:  [97.8 F (36.6 C)-100.1 F (37.8 C)] 98 F (36.7 C) (01/24 0929) Pulse Rate:  [77-110] 84 (01/24 0929) Resp:  [18-26] 22 (01/24 0929) BP: (96-142)/(66-105) 96/66 (01/24 0929) SpO2:  [92 %-99 %] 95 % (01/24 0929) Weight:  [55.6 kg] 55.6 kg (01/23 2041)  Weight change: -1.7 kg Filed Weights   03/14/21 2107 03/16/21 0502 03/16/21 2041  Weight: 55.8 kg 57.3 kg 55.6 kg    Intake/Output:    Intake/Output Summary (Last 24 hours) at 03/17/2021 1353 Last data filed at 03/17/2021 0951 Gross per 24 hour  Intake 10640 ml  Output 10359 ml  Net 281 ml     Physical Exam: General:  No acute distress, laying in the bed  HEENT  anicteric, moist oral mucous membrane  Pulm/lungs  normal breathing effort, lungs are clear to auscultation  CVS/Heart  regular rhythm, no rub or gallop  Abdomen:   Soft, diffusely tender, PD cathter in place  Extremities:  No peripheral edema  Neurologic:  Alert, oriented, able to follow commands  Skin:  No acute rashes       Basic Metabolic Panel:  Recent Labs  Lab 03/14/21 1025 03/14/21 2120 03/15/21 0808 03/16/21 0400 03/17/21 0413  NA 138  --  138 137 138  K 3.4*  --  3.5 3.0* 3.5  CL 97*  --  96* 101 99  CO2 28  --  25 23 26   GLUCOSE 106*  --  98 111* 122*  BUN 71*  --  72* 71* 72*  CREATININE 8.74* 9.40* 8.24* 8.05* 8.15*  CALCIUM 7.0*  --  6.4* 6.3* 6.7*  MG  --   --  1.7 1.5* 1.7  PHOS  --   --  7.4* 6.8* 7.1*      CBC: Recent Labs  Lab 03/14/21 1025 03/14/21 2112 03/15/21 0808 03/16/21 0400 03/17/21 0413  WBC 15.7* 16.4* 14.1* 12.4* 14.5*  NEUTROABS   --  13.9*  --   --   --   HGB 10.0* 10.2* 9.2* 8.9* 8.9*  HCT 32.7* 32.3* 29.4* 28.8* 29.5*  MCV 87.7 86.6 86.2 85.2 86.5  PLT 573* 590* 538* 493* 467*       Lab Results  Component Value Date   HEPBSAG NON REACTIVE 12/27/2019   HEPBSAB NON REACTIVE 12/27/2019   HEPBIGM NON REACTIVE 12/27/2019      Microbiology:  Recent Results (from the past 240 hour(s))  Resp Panel by RT-PCR (Flu A&B, Covid) Nasopharyngeal Swab     Status: None   Collection Time: 03/14/21  5:14 PM   Specimen: Nasopharyngeal Swab; Nasopharyngeal(NP) swabs in vial transport medium  Result Value Ref Range Status   SARS Coronavirus 2 by RT PCR NEGATIVE NEGATIVE Final    Comment: (NOTE) SARS-CoV-2 target nucleic acids are NOT DETECTED.  The SARS-CoV-2 RNA is generally detectable in upper respiratory specimens during the acute phase of infection. The lowest concentration of SARS-CoV-2 viral copies this assay can detect is 138 copies/mL. A negative result does not preclude SARS-Cov-2 infection and should not be used as the sole basis for treatment or other patient management decisions. A negative result may  occur with  improper specimen collection/handling, submission of specimen other than nasopharyngeal swab, presence of viral mutation(s) within the areas targeted by this assay, and inadequate number of viral copies(<138 copies/mL). A negative result must be combined with clinical observations, patient history, and epidemiological information. The expected result is Negative.  Fact Sheet for Patients:  EntrepreneurPulse.com.au  Fact Sheet for Healthcare Providers:  IncredibleEmployment.be  This test is no t yet approved or cleared by the [place] FDA and  has been authorized for detection and/or diagnosis of SARS-CoV-2 by FDA under an Emergency Use Authorization (EUA). This EUA will remain  in effect (meaning this test can be used) for the duration of  the COVID-19 declaration under Section 564(b)(1) of the Act, 21 U.S.C.section 360bbb-3(b)(1), unless the authorization is terminated  or revoked sooner.       Influenza A by PCR NEGATIVE NEGATIVE Final   Influenza B by PCR NEGATIVE NEGATIVE Final    Comment: (NOTE) The Xpert Xpress SARS-CoV-2/FLU/RSV plus assay is intended as an aid in the diagnosis of influenza from Nasopharyngeal swab specimens and should not be used as a sole basis for treatment. Nasal washings and aspirates are unacceptable for Xpert Xpress SARS-CoV-2/FLU/RSV testing.  Fact Sheet for Patients: EntrepreneurPulse.com.au  Fact Sheet for Healthcare Providers: IncredibleEmployment.be  This test is not yet approved or cleared by the [place] FDA and has been authorized for detection and/or diagnosis of SARS-CoV-2 by FDA under an Emergency Use Authorization (EUA). This EUA will remain in effect (meaning this test can be used) for the duration of the COVID-19 declaration under Section 564(b)(1) of the Act, 21 U.S.C. section 360bbb-3(b)(1), unless the authorization is terminated or revoked.  Performed at [facility], [address] [place], [place] [address]   Blood Culture (routine x 2)     Status: None (Preliminary result)   Collection Time: 03/14/21  5:14 PM   Specimen: BLOOD  Result Value Ref Range Status   Specimen Description BLOOD RIGHT ANTECUBITAL  Final   Special Requests   Final    BOTTLES DRAWN AEROBIC AND ANAEROBIC Blood Culture adequate volume   Culture   Final    NO GROWTH 3 DAYS Performed at [facility], [address] [place], [place] [address]    Report Status PENDING  Incomplete  Urine Culture     Status: Abnormal   Collection Time: 03/14/21  5:20 PM   Specimen: Urine, Random  Result Value Ref Range Status   Specimen Description   Final    URINE, RANDOM Performed at [facility], [address], [place],  [place] [address]    Special Requests   Final    NONE Performed at [facility], [address] [place], [place] [address]    Culture (A)  Final    <10,000 COLONIES/mL INSIGNIFICANT GROWTH Performed at [facility], [place] [address] [place], [place] [address]    Report Status 03/17/2021 FINAL  Final  Body fluid culture w Gram Stain     Status: None (Preliminary result)   Collection Time: 03/14/21  7:19 PM   Specimen: Peritoneal Dialysate; Body Fluid  Result Value Ref Range Status   Specimen Description   Final    PERITONEAL DIALYSATE Performed at [facility], [address] [place], [place] [address]    Special Requests   Final    Normal Performed at [facility], [address] [place],  [address]    Gram Stain   Final  MODERATE WBC PRESENT,BOTH PMN AND MONONUCLEAR NO ORGANISMS SEEN    Culture   Final    NO GROWTH 2 DAYS Performed at [facility], [place] [address] [place], [place] [address]    Report Status PENDING  Incomplete  Blood Culture (routine x 2)     Status: None (Preliminary result)   Collection Time: 03/14/21  9:12 PM   Specimen: BLOOD  Result Value Ref Range Status   Specimen Description BLOOD RIGHT ANTECUBITAL  Final   Special Requests   Final    BOTTLES DRAWN AEROBIC AND ANAEROBIC Blood Culture adequate volume   Culture   Final    NO GROWTH 3 DAYS Performed at [facility], [address] [place],  [address]    Report Status PENDING  Incomplete    Coagulation Studies: Recent Labs    03/14/21 05/13/10  LABPROT 20.3*  INR 1.7*     Urinalysis: Recent Labs    03/14/21 1720  COLORURINE YELLOW*  LABSPEC 1.015  PHURINE 5.0  GLUCOSEU NEGATIVE  HGBUR SMALL*  BILIRUBINUR NEGATIVE  KETONESUR NEGATIVE  PROTEINUR 30*  NITRITE NEGATIVE  LEUKOCYTESUR NEGATIVE       Imaging: No results found.   Medications:    cefTAZidime (FORTAZ)  IV 1 g (03/17/21 1035)   dialysis solution 1.5%  low-MG/low-CA     sodium chloride      aspirin EC  81 mg Oral Daily   atorvastatin  40 mg Oral Daily   calcitRIOL  0.5 mcg Oral Daily   calcium carbonate  1,250 mg Oral BID WC   Chlorhexidine Gluconate Cloth  6 each Topical Daily   epoetin (EPOGEN/PROCRIT) injection  10,000 Units Subcutaneous Weekly   gentamicin cream  1 application Topical Daily   heparin  5,000 Units Subcutaneous Q8H   magnesium oxide  400 mg Oral Daily   metoprolol succinate  50 mg Oral Daily   nystatin  5 mL Oral TID   potassium chloride  20 mEq Oral Daily   Ensure Max Protein  11 oz Oral BID   sucroferric oxyhydroxide  1,000 mg Oral TID   Vitamin D (Ergocalciferol)  50,000 Units Oral Q Sat   acetaminophen, benzonatate, bisacodyl, heparin, ondansetron **OR** ondansetron (ZOFRAN) IV, oxyCODONE  Assessment/ Plan:  66 y.o. male with   end stage renal disease on peritoneal dialysis, hypertension, hyperlipidemia, coronary artery disease status post CABG, history of bladder cancer, COPD with ongoing tobacco use, who is    admitted on 03/14/2021 for Spontaneous bacterial peritonitis ([place]) [K65.2] Generalized abdominal pain [R10.84] Gallbladder sludge [K82.8] Sepsis ([place]) [A41.9] Sepsis, due to unspecified organism, unspecified whether acute organ dysfunction present ([place]) [A41.9]  [facility] PD [place]  #End-stage renal disease on peritoneal dialysis Will continue peritoneal dialysis and transition to hemodialysis once adequate antibiotic therapy is completed.  Successful PD treatment overnight, no fibrin visualized in drainage. UF of 766 mL achieved. We will continue nightly treatments   #Acute peritonitis Patient was admitted last week for the same problem and was discharged on January 16. Patient claims he was taking antibiotics at home as prescribed Currently being treated with IV ceftazidime and vancomycin Pharmacy consult for dosing assistance no growth thus far and fluid culture Follow PD fluid cell  count periodically  #Anemia of chronic kidney disease Lab Results  Component Value Date   HGB 8.9 (L) 03/17/2021  Receiving  Subcu Epogen weekly while hospitalized.  #Secondary hyperparathyroidism Lab Results  Component Value Date   PTH 159 (H)  12/27/2019   CALCIUM 6.7 (L) 03/17/2021   CAION 0.74 (LL) 01/20/2021   PHOS 7.1 (H) 03/17/2021  Monitor calcium and phosphorus during this admission  Calcium level slowly improving.  Continue calcium carbonate and calcitriol.    LOS: [place] 1/24/20231:53 PM  [place], [place]  Note: This note was prepared with Dragon dictation. Any transcription errors are unintentional

## 2021-03-17 NOTE — Consult Note (Signed)
NAMEHarrold Hess  DOB: 10-20-1955  MRN: [id number]  Date/Time: 03/17/2021 9:09 PM  REQUESTING PROVIDER: Dr. Horris Latino Subjective:  REASON FOR CONSULT: Peritonitis secondary to dialysis catheter ? [name] is a 66 y.o. male with a history of end-stage renal disease on peritoneal dialysis, hyperlipidemia, hypertension, CAD status post CABG, anemia of chronic disease Presented to the ED on 03/14/2021 with abdominal pain He was recently in the hospital between 03/04/2021 until 03/09/2021 and was treated for secondary bacterial peritonitis due to peritoneal dialysis catheter.  He had staff epidermidis in the peritoneal culture that was sent from the nephrologist office.  He was sent home on cefazolin to be administered during renal dialysis.  I am not sure whether patient was taking the medication because he still thinks he was going to the dialysis center to get IV antibiotics but that was not the case..  I had seen him briefly during last hospitalization but had signed off because nephrology was on board and they were managing his peritonitis..    In the ED temperature 99.5, BP 139 100, respiratory rate 18, pulse 88. WBC 16.4, Hb 10.2, platelet 590.  Blood cultures were sent. CT abdomen showed atrophic kidneys and moderate to severe right hydroureteronephrosis.  Left hydronephrosis had resolved.  Mildly distended thick-walled loops of small bowel.  Abdominal aortic aneurysm measuring 3.2 cm in diameter.  Peritoneal dialysis catheter was seen.  A trace amount of pneumoperitoneum within the anterior mid abdomen. Patient was started on vancomycin and ceftazidime. Underwent a paracentesis and that showed 2172 cells with 78% neutrophils and lymphs 8%.   I am asked to see the patient for secondary bacterial peritonitis. Past Medical History:  Diagnosis Date   Bladder tumor    Cancer [facility])    Bladder   Chronic kidney disease    renal insufficiency   Coronary artery disease    History of kidney  stones    Hypertension    Myocardial infarction [facility])    Wears glasses     Past Surgical History:  Procedure Laterality Date   CAPD INSERTION N/A 12/31/2019   Procedure: LAPAROSCOPIC INSERTION CONTINUOUS AMBULATORY PERITONEAL DIALYSIS  (CAPD) CATHETER;  Surgeon: Jules Husbands, MD;  Location: [facility] ORS;  Service: General;  Laterality: N/A;   CAPD REMOVAL N/A 04/10/2020   Procedure: LAPAROSCOPIC REVISION OF CONTINUOUS AMBULATORY PERITONEAL DIALYSIS  (CAPD) CATHETER;  Surgeon: Jules Husbands, MD;  Location: [facility];  Service: General;  Laterality: N/A;   CORONARY ARTERY BYPASS GRAFT N/A 12/27/2018   Procedure: CORONARY ARTERY BYPASS GRAFTING (CABG) X 4 ON PUMP USING RIGHT & LEFT INTERNAL MAMMARY ARTERY LEFT RADIAL ARTERY ENDOSCOPICALLY HARVESTED;  Surgeon: [name], MD;  Location: [place];  Service: Open Heart Surgery;  Laterality: N/A;   CYSTOSCOPY W/ RETROGRADES Bilateral 05/15/2019   Procedure: CYSTOSCOPY WITH RETROGRADE PYELOGRAM;  Surgeon: Abbie Sons, MD;  Location: [facility];  Service: Urology;  Laterality: Bilateral;   CYSTOSCOPY WITH BIOPSY N/A 05/15/2019   Procedure: CYSTOSCOPY WITH bladder BIOPSY;  Surgeon: Abbie Sons, MD;  Location: [facility];  Service: Urology;  Laterality: N/A;   DIALYSIS/PERMA CATHETER INSERTION N/A 12/28/2019   Procedure: DIALYSIS/PERMA CATHETER INSERTION;  Surgeon: Algernon Huxley, MD;  Location: [place] [facility];  Service: Cardiovascular;  Laterality: N/A;   DIALYSIS/PERMA CATHETER REMOVAL N/A 06/02/2020   Procedure: DIALYSIS/PERMA CATHETER REMOVAL;  Surgeon: Algernon Huxley, MD;  Location: [place] CV LAB;  Service: Cardiovascular;  Laterality: N/A;   EXCHANGE OF A DIALYSIS CATHETER  Right 04/10/2020   Procedure: EXCHANGE OF A DIALYSIS CATHETER;  Surgeon: Jules Husbands, MD;  Location: [facility];  Service: General;  Laterality: Right;   [place]  01/20/2021   Procedure: HERNIA REPAIR INCISIONAL;  Surgeon: Olean Ree, MD;  Location:  [facility];  Service: General;;   IR IMAGE GUIDED DRAINAGE PERCUT CATH  PERITONEAL RETROPERIT  04/07/2020   LEFT HEART CATH AND CORONARY ANGIOGRAPHY Left 12/20/2018   Procedure: LEFT HEART CATH AND CORONARY ANGIOGRAPHY;  Surgeon: Isaias Cowman, MD;  Location: [place] [facility] LAB;  Service: Cardiovascular;  Laterality: Left;   RADIAL ARTERY HARVEST Left 12/27/2018   Procedure: ENDOSCOPIC RADIAL ARTERY HARVEST;  Surgeon: [name], MD;  Location: [place];  Service: Open Heart Surgery;  Laterality: Left;   TEE WITHOUT CARDIOVERSION N/A 12/27/2018   Procedure: TRANSESOPHAGEAL ECHOCARDIOGRAM (TEE);  Surgeon: [name], MD;  Location: [place];  Service: Open Heart Surgery;  Laterality: N/A;   TUMOR REMOVAL  2019   Bladder    Social History   Socioeconomic History   Marital status: Married    Spouse name: Not on file   Number of children: Not on file   Years of education: Not on file   Highest education level: Not on file  Occupational History   Not on file  Tobacco Use   Smoking status: Former    Packs/day: 0.25    Types: Cigarettes    Quit date: 02/19/2019    Years since quitting: 2.0   Smokeless tobacco: Never  Vaping Use   Vaping Use: Never used  Substance and Sexual Activity   Alcohol use: Never   Drug use: Never   Sexual activity: Yes  Other Topics Concern   Not on file  Social History Narrative   Not on file   Social Determinants of Health   Financial Resource Strain: Not on file  Food Insecurity: Not on file  Transportation Needs: Not on file  Physical Activity: Not on file  Stress: Not on file  Social Connections: Not on file  Intimate Partner Violence: Not on file    Family History  Family history unknown: Yes   No Known Allergies I? Current Facility-Administered Medications  Medication Dose Route Frequency Provider Last Rate Last Admin   acetaminophen (TYLENOL) tablet 650 mg  650 mg Oral Q6H PRN Val Riles, MD   650 mg at 03/16/21 2107    aspirin EC tablet 81 mg  81 mg Oral Daily Val Riles, MD   81 mg at 03/17/21 1029   atorvastatin (LIPITOR) tablet 40 mg  40 mg Oral Daily Val Riles, MD   40 mg at 03/17/21 1028   benzonatate (TESSALON) capsule 200 mg  200 mg Oral TID PRN Val Riles, MD       bisacodyl (DULCOLAX) EC tablet 5 mg  5 mg Oral Daily PRN Val Riles, MD       calcitRIOL (ROCALTROL) capsule 0.5 mcg  0.5 mcg Oral Daily Val Riles, MD   0.5 mcg at 03/17/21 1042   calcium carbonate (OS-CAL - dosed in mg of elemental calcium) tablet 1,250 mg  1,250 mg Oral BID WC Val Riles, MD   1,250 mg at 03/17/21 1752   cefTAZidime (FORTAZ) 1 g in sodium chloride 0.9 % 100 mL IVPB  1 g Intravenous Q24H Dallie Piles, RPH 200 mL/hr at 03/17/21 1035 1 g at 03/17/21 1035   Chlorhexidine Gluconate Cloth 2 % PADS 6 each  6 each Topical Daily Dwyane Dee,  Dileep, MD   6 each at 03/17/21 1450   dialysis solution 1.5% low-MG/low-CA dianeal solution   Intraperitoneal Q24H Val Riles, MD       epoetin alfa (EPOGEN) injection 10,000 Units  10,000 Units Subcutaneous Weekly Murlean Iba, MD   10,000 Units at 03/16/21 1605   gentamicin cream (GARAMYCIN) 0.1 % 1 application  1 application Topical Daily Val Riles, MD   1 application at 51/76/16 1039   heparin 1000 unit/ml injection 500 Units  500 Units Intraperitoneal PRN Val Riles, MD       heparin injection 5,000 Units  5,000 Units Subcutaneous Q8H Val Riles, MD   5,000 Units at 03/17/21 2039   magnesium oxide (MAG-OX) tablet 400 mg  400 mg Oral Daily Murlean Iba, MD   400 mg at 03/17/21 1029   metoprolol succinate (TOPROL-XL) 24 hr tablet 50 mg  50 mg Oral Daily Val Riles, MD   50 mg at 03/16/21 0940   nystatin (MYCOSTATIN) 100000 UNIT/ML suspension 500,000 Units  5 mL Oral TID Darrick Penna, RPH   500,000 Units at 03/17/21 1544   ondansetron (ZOFRAN) tablet 4 mg  4 mg Oral Q6H PRN Val Riles, MD       Or   ondansetron [facility]) injection 4 mg  4 mg  Intravenous Q6H PRN Val Riles, MD       oxyCODONE (Oxy IR/ROXICODONE) immediate release tablet 5 mg  5 mg Oral Q4H PRN Val Riles, MD   5 mg at 03/17/21 2040   potassium chloride SA (KLOR-CON M) CR tablet 20 mEq  20 mEq Oral Daily Murlean Iba, MD   20 mEq at 03/17/21 1027   protein supplement (ENSURE MAX) liquid  11 oz Oral BID Murlean Iba, MD   11 oz at 03/17/21 1026   sodium chloride 0.9 % bolus 250 mL  250 mL Intravenous Once Val Riles, MD       sucroferric oxyhydroxide [facility]) chewable tablet 1,000 mg  1,000 mg Oral TID Val Riles, MD   1,000 mg at 03/17/21 2039   Vitamin D (Ergocalciferol) (DRISDOL) capsule 50,000 Units  50,000 Units Oral Q Sat Val Riles, MD   50,000 Units at 03/14/21 2312     Abtx:  Anti-infectives (From admission, onward)    Start     Dose/Rate Route Frequency Ordered Stop   03/16/21 1200  cefTAZidime (FORTAZ) 1 g in sodium chloride 0.9 % 100 mL IVPB        1 g 200 mL/hr over 30 Minutes Intravenous Every 24 hours 03/16/21 1015     03/14/21 2200  nystatin (MYCOSTATIN) tablet 500,000 Units  Status:  Discontinued        1 tablet Oral 3 times daily 03/14/21 2110 03/15/21 1810   03/14/21 1800  cefTAZidime (FORTAZ) 1 g in sodium chloride 0.9 % 100 mL IVPB        1 g 200 mL/hr over 30 Minutes Intravenous  Once 03/14/21 1716 03/14/21 2323   03/14/21 1800  vancomycin (VANCOREADY) IVPB 1250 mg/250 mL        1,250 mg 166.7 mL/hr over 90 Minutes Intravenous  Once 03/14/21 1716 03/15/21 0046   03/14/21 1730  aztreonam (AZACTAM) 2 g in sodium chloride 0.9 % 100 mL IVPB  Status:  Discontinued        2 g 200 mL/hr over 30 Minutes Intravenous  Once 03/14/21 1721 03/14/21 1805   03/14/21 1730  metroNIDAZOLE (FLAGYL) IVPB 500 mg  Status:  Discontinued  500 mg 100 mL/hr over 60 Minutes Intravenous  Once 03/14/21 1721 03/14/21 1722   03/14/21 1730  vancomycin (VANCOCIN) IVPB 1000 mg/200 mL premix  Status:  Discontinued        1,000 mg 200 mL/hr over  60 Minutes Intravenous  Once 03/14/21 1721 03/14/21 1722       REVIEW OF SYSTEMS:  Const: negative fever, negative chills, has weight loss Eyes: negative diplopia or visual changes, negative eye pain ENT: negative coryza, negative sore throat Resp: Has baseline cough, no hemoptysis, no dyspnea Cards: negative for chest pain, palpitations, lower extremity edema GU: negative for frequency, dysuria and hematuria GI: Has abdominal pain.  No bleeding per rectum. Skin: negative for rash and pruritus Heme: negative for easy bruising and gum/nose bleeding MS: Generalized weakness Neurolo:negative for headaches, dizziness, vertigo, memory problems  Psych: negative for feelings of anxiety, depression  Endocrine: negative for thyroid, diabetes Allergy/Immunology- negative for any medication or food allergies  Objective:  VITALS:  BP (!) 132/92 (BP Location: Left Arm)    Pulse (!) 101    Temp 98.9 F (37.2 C) (Oral)    Resp 16    Ht 5\' 3"  (1.6 m)    Wt 58.4 kg    SpO2 96%    BMI 22.81 kg/m  PHYSICAL EXAM:  General: Alert, cooperative, no distress, emaciated, chronically ill Head: Normocephalic, without obvious abnormality, atraumatic. Eyes: Conjunctivae clear, anicteric sclerae. Pupils are equal ENT Nares normal. No drainage or sinus tenderness. Tongue coated.  While Neck: Supple, symmetrical, no adenopathy, thyroid: non tender no carotid bruit and no JVD. Back: No CVA tenderness. Lungs: Bilateral air entry Heart: Regular rate and rhythm, no murmur, rub or gallop. Abdomen: Mildly distended Soft Tenderness over the umbilical and lower abdominal area PD catheter present Extremities: atraumatic, no cyanosis. No edema. No clubbing Skin: No rashes or lesions. Or bruising Lymph: Cervical, supraclavicular normal. Neurologic: Grossly non-focal Pertinent Labs Lab Results CBC    Component Value Date/Time   WBC 14.5 (H) 03/17/2021 0413   RBC 3.41 (L) 03/17/2021 0413   HGB 8.9 (L)  03/17/2021 0413   HGB 9.1 (L) 01/04/2020 1540   HCT 29.5 (L) 03/17/2021 0413   HCT 29.6 (L) 01/04/2020 1540   PLT 467 (H) 03/17/2021 0413   PLT 191 01/04/2020 1540   MCV 86.5 03/17/2021 0413   MCV 94 01/04/2020 1540   MCH 26.1 03/17/2021 0413   MCHC 30.2 03/17/2021 0413   RDW 15.7 (H) 03/17/2021 0413   RDW 16.2 (H) 01/04/2020 1540   LYMPHSABS 0.9 03/14/2021 2112   LYMPHSABS 1.0 01/04/2020 1540   MONOABS 1.4 (H) 03/14/2021 2112   EOSABS 0.2 03/14/2021 2112   EOSABS 0.5 (H) 03/27/2018 1643   BASOSABS 0.0 03/14/2021 2112   BASOSABS 0.0 03/27/2018 1643    CMP Latest Ref Rng & Units 03/17/2021 03/16/2021 03/15/2021  Glucose 70 - 99 mg/dL 122(H) 111(H) 98  BUN 8 - 23 mg/dL 72(H) 71(H) 72(H)  Creatinine 0.61 - 1.24 mg/dL 8.15(H) 8.05(H) 8.24(H)  Sodium 135 - 145 mmol/L 138 137 138  Potassium 3.5 - 5.1 mmol/L 3.5 3.0(L) 3.5  Chloride 98 - 111 mmol/L 99 101 96(L)  CO2 22 - 32 mmol/L 26 23 25   Calcium 8.9 - 10.3 mg/dL 6.7(L) 6.3(LL) 6.4(LL)  Total Protein 6.5 - 8.1 g/dL - - -  Total Bilirubin 0.3 - 1.2 mg/dL - - -  Alkaline Phos 38 - 126 U/L - - -  AST 15 - 41 U/L - - -  ALT 0 - 44 U/L - - -      Microbiology: Recent Results (from the past 240 hour(s))  Resp Panel by RT-PCR (Flu A&B, Covid) Nasopharyngeal Swab     Status: None   Collection Time: 03/14/21  5:14 PM   Specimen: Nasopharyngeal Swab; Nasopharyngeal(NP) swabs in vial transport medium  Result Value Ref Range Status   SARS Coronavirus 2 by RT PCR NEGATIVE NEGATIVE Final    Comment: (NOTE) SARS-CoV-2 target nucleic acids are NOT DETECTED.  The SARS-CoV-2 RNA is generally detectable in upper respiratory specimens during the acute phase of infection. The lowest concentration of SARS-CoV-2 viral copies this assay can detect is 138 copies/mL. A negative result does not preclude SARS-Cov-2 infection and should not be used as the sole basis for treatment or other patient management decisions. A negative result may occur  with  improper specimen collection/handling, submission of specimen other than nasopharyngeal swab, presence of viral mutation(s) within the areas targeted by this assay, and inadequate number of viral copies(<138 copies/mL). A negative result must be combined with clinical observations, patient history, and epidemiological information. The expected result is Negative.  Fact Sheet for Patients:  EntrepreneurPulse.com.au  Fact Sheet for Healthcare Providers:  IncredibleEmployment.be  This test is no t yet approved or cleared by the [place] FDA and  has been authorized for detection and/or diagnosis of SARS-CoV-2 by FDA under an Emergency Use Authorization (EUA). This EUA will remain  in effect (meaning this test can be used) for the duration of the COVID-19 declaration under Section 564(b)(1) of the Act, 21 U.S.C.section 360bbb-3(b)(1), unless the authorization is terminated  or revoked sooner.       Influenza A by PCR NEGATIVE NEGATIVE Final   Influenza B by PCR NEGATIVE NEGATIVE Final    Comment: (NOTE) The Xpert Xpress SARS-CoV-2/FLU/RSV plus assay is intended as an aid in the diagnosis of influenza from Nasopharyngeal swab specimens and should not be used as a sole basis for treatment. Nasal washings and aspirates are unacceptable for Xpert Xpress SARS-CoV-2/FLU/RSV testing.  Fact Sheet for Patients: EntrepreneurPulse.com.au  Fact Sheet for Healthcare Providers: IncredibleEmployment.be  This test is not yet approved or cleared by the [place] FDA and has been authorized for detection and/or diagnosis of SARS-CoV-2 by FDA under an Emergency Use Authorization (EUA). This EUA will remain in effect (meaning this test can be used) for the duration of the COVID-19 declaration under Section 564(b)(1) of the Act, 21 U.S.C. section 360bbb-3(b)(1), unless the authorization is terminated  or revoked.  Performed at [facility], [address] [place], [place] [address]   Blood Culture (routine x 2)     Status: None (Preliminary result)   Collection Time: 03/14/21  5:14 PM   Specimen: BLOOD  Result Value Ref Range Status   Specimen Description BLOOD RIGHT ANTECUBITAL  Final   Special Requests   Final    BOTTLES DRAWN AEROBIC AND ANAEROBIC Blood Culture adequate volume   Culture   Final    NO GROWTH 3 DAYS Performed at [facility], [address] [place], [place] [address]    Report Status PENDING  Incomplete  Urine Culture     Status: Abnormal   Collection Time: 03/14/21  5:20 PM   Specimen: Urine, Random  Result Value Ref Range Status   Specimen Description   Final    URINE, RANDOM Performed at [facility], [address], [place], [place] [address]    Special Requests   Final  NONE Performed at [facility], [address] [place], [place] [address]    Culture (A)  Final    <10,000 COLONIES/mL INSIGNIFICANT GROWTH Performed at [place] [address], [place], [place] [address]    Report Status 03/17/2021 FINAL  Final  Body fluid culture w Gram Stain     Status: None (Preliminary result)   Collection Time: 03/14/21  7:19 PM   Specimen: Peritoneal Dialysate; Body Fluid  Result Value Ref Range Status   Specimen Description   Final    PERITONEAL DIALYSATE Performed at [facility], [address] [place], [place] [address]    Special Requests   Final    Normal Performed at [facility], [address] [place], [place] [address]    Gram Stain   Final    MODERATE WBC PRESENT,BOTH PMN AND MONONUCLEAR NO ORGANISMS SEEN    Culture   Final    NO GROWTH 2 DAYS Performed at [facility], [place] [address] [place], [place] [address]    Report Status PENDING  Incomplete  Blood Culture (routine x 2)     Status: None (Preliminary result)   Collection Time: 03/14/21  9:12 PM    Specimen: BLOOD  Result Value Ref Range Status   Specimen Description BLOOD RIGHT ANTECUBITAL  Final   Special Requests   Final    BOTTLES DRAWN AEROBIC AND ANAEROBIC Blood Culture adequate volume   Culture   Final    NO GROWTH 3 DAYS Performed at [facility], [address] [place],  [address]    Report Status PENDING  Incomplete    IMAGING RESULTS: I have personally reviewed the films ? Impression/Recommendation Bacterial peritonitis secondary to peritoneal dialysis catheter Has had chronic leukocytosis since earlier this month in spite of antibiotics. Need to remove the infected dialysis catheter which could be keeping the leukocytosis up As a permanent hemodialysis catheter cannot be placed now a temporary catheter must be placed for HD.    Recent coag negative staph PD catheter related peritonitis Patient was discharged on cefazolin but I am not sure whether he was taking it with the peritoneal dialysis. Prior to that patient was on IV Vanco and ceftazidime at the dialysis center  Anemia of chronic disease CAD status post CABG On metoprolol ? __Hyperlipidemia on atorvastatin _________________________________________________ Discussed with patient, and care team.  Note:  This document was prepared using Dragon voice recognition software and may include unintentional dictation errors.

## 2021-03-17 NOTE — Progress Notes (Signed)
Pharmacy Antibiotic Note  Anthony Hess is a 66 y.o. male w/ PMH of CAD s/p CABG, HTN, A. fib, CHF, ESRD on PD, history of bladder cancer, hyperparathyroidism, anemia of chronic disease admitted on 03/14/2021 with SBP.  Pharmacy has been consulted for vancomycin and ceftazidime dosing. On admission he received 1250 mg IV vancomycin and 1000 mg IV ceftazidime.  Plan:  1) will order a vancomycin level with am labs 01/25 redose vancomycin when level is < 15 mcg/mL  2) continue ceftazidime 1 gram IV every 24 hours   Height: 5\' 3"  (160 cm) Weight: 55.6 kg (122 lb 9.2 oz) IBW/kg (Calculated) : 56.9  Temp (24hrs), Avg:99 F (37.2 C), Min:97.9 F (36.6 C), Max:100.1 F (37.8 C)  Recent Labs  Lab 03/14/21 1025 03/14/21 2112 03/14/21 2120 03/15/21 0808 03/16/21 0400 03/17/21 0413  WBC 15.7* 16.4*  --  14.1* 12.4* 14.5*  CREATININE 8.74*  --  9.40* 8.24* 8.05* 8.15*  LATICACIDVEN 2.0* 1.3  --   --   --   --   VANCORANDOM  --   --   --   --   --  28     Estimated Creatinine Clearance: 7.3 mL/min (A) (by C-G formula based on SCr of 8.15 mg/dL (H)).    No Known Allergies  Antimicrobials this admission: 01/21 ceftazidime >>  01/21 vancomycin >>   Microbiology results: 01/21 BCx: NGTD 01/21 WCx (peritoneal dialysate) : NGTD 01/21 UCx: insignificant growth 01/21 SARS CoV-2: negative 01/21 influenza A/B: negative    Thank you for allowing pharmacy to be a part of this patients care.  Dallie Piles 03/17/2021 7:00 AM

## 2021-03-17 NOTE — Progress Notes (Signed)
[facility] Progress Note  Patient: Anthony Hess    QMG:[id number]  DOA: 03/14/2021     Date of Service: the patient was seen and examined on 03/17/2021  Chief Complaint  Patient presents with   Abdominal Pain   Brief hospital course: Anthony Hess is a 66 y.o. male with Past medical history of CAD s/p CABG, HTN, CHF, ESRD on PD, history of bladder cancer, hyperparathyroidism, anemia of chronic disease, recently discharged on 1/16, after management for SBP. Patient presented back in the ED secondary to worsening of abdominal pain. In the ED, BP soft 99/81, saturating well on room air. Labs procalcitonin 1.35, WBC 15.7. CT abdomen pelvis with possible enteritis, ascites, pneumoperitoneum due to PD catheter, edema, 3.2 cm AAA. Pt admitted for further management.   Assessment and Plan: Principal Problem:   Sepsis ([place]) Active Problems:   History of bladder cancer   Hypertension   Atrial fibrillation (HCC)   ESRD (end stage renal disease) ([place])     Sepsis secondary to SBP Currently afebrile with leukocytosis BC x2 NGTD CT abdomen pelvis with possible enteritis, ascites, pneumoperitoneum due to PD catheter, edema, 3.2 cm AAA. Peritoneal fluid analysis showed WBC count 2172, elevated Peritoneal fluid culture NGTD Discussed with ID on 03/17/21, recommend PD catheter be removed, discharge antibiotics and duration will be determined by nephrology Continue vancomycin, ceftazidime Monitor closely   ESRD on PD Nephrology on board PD will be continued, plan to transition to HD once resolution of infection and antibiotics completed  Anemia of ESRD Hemoglobin slightly low from baseline Further management per nephrology Daily CBC  HTN, CHF Held antihypertensive medications for now due to soft blood pressure, except for metoprolol Hold hydralazine, irbesartan  CAD s/p CABG Resumed aspirin and statin  Abdominal aortic aneurysm Recommended ultrasound every 3 years for AAA    History of bladder cancer s/p transurethral resection      Body mass index is 21.79 kg/m.  Interventions:       Diet: renal DVT Prophylaxis: Subcutaneous Heparin    Advance goals of care discussion: Full code  Family Communication:  None at bedside  Disposition:  Plan to d/c home  Subjective:  Still c/o abdominal pain, about 7/10, denies any diarrhea, N/V, fever/chills, chest pain, SOB    Physical Exam: General: NAD  Cardiovascular: S1, S2 present Respiratory: Diminished BS b/l with some minimal wheezing Abdomen: Soft, +tender, +distended, bowel sounds present, PD catheter intact Musculoskeletal: No bilateral pedal edema noted Skin: Normal Psychiatry: Normal mood   Vitals:   03/17/21 0049 03/17/21 0432 03/17/21 0752 03/17/21 0929  BP: 96/84 (!) 131/91 120/90 96/66  Pulse: 90 77 83 84  Resp:  20 18 (!) 22  Temp: 98.7 F (37.1 C) 97.9 F (36.6 C) 97.8 F (36.6 C) 98 F (36.7 C)  TempSrc: Oral Oral    SpO2: 95% 98% 97% 95%  Weight:      Height:        Intake/Output Summary (Last 24 hours) at 03/17/2021 1255 Last data filed at 03/17/2021 0951 Gross per 24 hour  Intake 10640 ml  Output 10359 ml  Net 281 ml   Filed Weights   03/14/21 2107 03/16/21 0502 03/16/21 2041  Weight: 55.8 kg 57.3 kg 55.6 kg      CBC: Recent Labs  Lab 03/14/21 1025 03/14/21 2112 03/15/21 0808 03/16/21 0400 03/17/21 0413  WBC 15.7* 16.4* 14.1* 12.4* 14.5*  NEUTROABS  --  13.9*  --   --   --   HGB  10.0* 10.2* 9.2* 8.9* 8.9*  HCT 32.7* 32.3* 29.4* 28.8* 29.5*  MCV 87.7 86.6 86.2 85.2 86.5  PLT 573* 590* 538* 493* 694*   Basic Metabolic Panel: Recent Labs  Lab 03/14/21 1025 03/14/21 2120 03/15/21 0808 03/16/21 0400 03/17/21 0413  NA 138  --  138 137 138  K 3.4*  --  3.5 3.0* 3.5  CL 97*  --  96* 101 99  CO2 28  --  25 23 26   GLUCOSE 106*  --  98 111* 122*  BUN 71*  --  72* 71* 72*  CREATININE 8.74* 9.40* 8.24* 8.05* 8.15*  CALCIUM 7.0*  --  6.4* 6.3* 6.7*   MG  --   --  1.7 1.5* 1.7  PHOS  --   --  7.4* 6.8* 7.1*    Studies: No results found.  Scheduled Meds:  aspirin EC  81 mg Oral Daily   atorvastatin  40 mg Oral Daily   calcitRIOL  0.5 mcg Oral Daily   calcium carbonate  1,250 mg Oral BID WC   Chlorhexidine Gluconate Cloth  6 each Topical Daily   epoetin (EPOGEN/PROCRIT) injection  10,000 Units Subcutaneous Weekly   gentamicin cream  1 application Topical Daily   heparin  5,000 Units Subcutaneous Q8H   magnesium oxide  400 mg Oral Daily   metoprolol succinate  50 mg Oral Daily   nystatin  5 mL Oral TID   potassium chloride  20 mEq Oral Daily   Ensure Max Protein  11 oz Oral BID   sucroferric oxyhydroxide  1,000 mg Oral TID   Vitamin D (Ergocalciferol)  50,000 Units Oral Q Sat   Continuous Infusions:  cefTAZidime (FORTAZ)  IV 1 g (03/17/21 1035)   dialysis solution 1.5% low-MG/low-CA     sodium chloride     PRN Meds: acetaminophen, benzonatate, bisacodyl, heparin, ondansetron **OR** ondansetron (ZOFRAN) IV, oxyCODONE    Author: Alma Friendly, MD [facility] 03/17/2021 12:55 PM  To reach On-call, see care teams to locate the attending and reach out to them via [email or web address]. If 7PM-7AM, please contact night-coverage

## 2021-03-17 NOTE — Care Management Important Message (Signed)
Important Message  Patient Details  Name: Anthony Hess MRN: [id number] Date of Birth: [birth year]   Medicare Important Message Given:  Yes     Dannette Barbara 03/17/2021, 10:17 AM

## 2021-03-18 ENCOUNTER — Encounter
Admission: EM | Disposition: A | Discharge status: Home / Self Care | Payer: Self-pay | Source: Home / Self Care | Attending: Internal Medicine

## 2021-03-18 DIAGNOSIS — N185 Chronic kidney disease, stage 5: Secondary | ICD-10-CM | POA: Diagnosis not present

## 2021-03-18 DIAGNOSIS — I714 Abdominal aortic aneurysm, without rupture, unspecified: Secondary | ICD-10-CM | POA: Diagnosis present

## 2021-03-18 HISTORY — PX: DIALYSIS/PERMA CATHETER INSERTION: CATH118288

## 2021-03-18 LAB — VANCOMYCIN, RANDOM: Vancomycin Rm: 24

## 2021-03-18 LAB — BODY FLUID CULTURE W GRAM STAIN
Culture: NO GROWTH
Special Requests: NORMAL

## 2021-03-18 LAB — CBC
HCT: 31.3 % — ABNORMAL LOW (ref 39.0–52.0)
Hemoglobin: 9.5 g/dL — ABNORMAL LOW (ref 13.0–17.0)
MCH: 26.9 pg (ref 26.0–34.0)
MCHC: 30.4 g/dL (ref 30.0–36.0)
MCV: 88.7 fL (ref 80.0–100.0)
Platelets: 481 10*3/uL — ABNORMAL HIGH (ref 150–400)
RBC: 3.53 MIL/uL — ABNORMAL LOW (ref 4.22–5.81)
RDW: 15.6 % — ABNORMAL HIGH (ref 11.5–15.5)
WBC: 11.7 10*3/uL — ABNORMAL HIGH (ref 4.0–10.5)
nRBC: 0 % (ref 0.0–0.2)

## 2021-03-18 LAB — RENAL FUNCTION PANEL
Albumin: <1.5 g/dL — ABNORMAL LOW (ref 3.5–5.0)
Anion gap: 13 (ref 5–15)
BUN: 68 mg/dL — ABNORMAL HIGH (ref 8–23)
CO2: 26 mmol/L (ref 22–32)
Calcium: 7.1 mg/dL — ABNORMAL LOW (ref 8.9–10.3)
Chloride: 99 mmol/L (ref 98–111)
Creatinine, Ser: 7.77 mg/dL — ABNORMAL HIGH (ref 0.61–1.24)
GFR, Estimated: 7 mL/min — ABNORMAL LOW (ref >60)
Glucose, Bld: 109 mg/dL — ABNORMAL HIGH (ref 70–99)
Phosphorus: 6.7 mg/dL — ABNORMAL HIGH (ref 2.5–4.6)
Potassium: 3.2 mmol/L — ABNORMAL LOW (ref 3.5–5.1)
Sodium: 138 mmol/L (ref 135–145)

## 2021-03-18 LAB — HEPATITIS B SURFACE ANTIGEN: Hepatitis B Surface Ag: NONREACTIVE

## 2021-03-18 LAB — HEPATITIS B CORE ANTIBODY, TOTAL: Hep B Core Total Ab: NONREACTIVE

## 2021-03-18 SURGERY — DIALYSIS/PERMA CATHETER INSERTION
Anesthesia: Moderate Sedation

## 2021-03-18 MED ORDER — MIDAZOLAM HCL 5 MG/5ML IJ SOLN
INTRAMUSCULAR | Status: AC
  Filled 2021-03-18: qty 5

## 2021-03-18 MED ORDER — SODIUM CHLORIDE 0.9 % IV SOLN: INTRAVENOUS | Status: DC

## 2021-03-18 MED ORDER — FENTANYL CITRATE (PF) 100 MCG/2ML IJ SOLN
INTRAMUSCULAR | Status: DC | PRN
  Administered 2021-03-18: 50 ug via INTRAVENOUS

## 2021-03-18 MED ORDER — CEFAZOLIN SODIUM-DEXTROSE 1-4 GM/50ML-% IV SOLN
INTRAVENOUS | Status: AC
  Filled 2021-03-18: qty 50

## 2021-03-18 MED ORDER — MIDAZOLAM HCL 2 MG/2ML IJ SOLN
INTRAMUSCULAR | Status: DC | PRN
  Administered 2021-03-18: 2 mg via INTRAVENOUS

## 2021-03-18 MED ORDER — IODIXANOL 320 MG/ML IV SOLN
INTRAVENOUS | Status: DC | PRN
  Administered 2021-03-18: 16:00:00 5 mL

## 2021-03-18 MED ORDER — POTASSIUM CHLORIDE CRYS ER 20 MEQ PO TBCR
40.0000 meq | EXTENDED_RELEASE_TABLET | Freq: Once | ORAL | Status: AC
Start: 2021-03-18 — End: 2021-03-18
  Administered 2021-03-18: 18:00:00 40 meq via ORAL
  Filled 2021-03-18: qty 2

## 2021-03-18 MED ORDER — CEFAZOLIN SODIUM-DEXTROSE 1-4 GM/50ML-% IV SOLN: 1.0000 g | INTRAVENOUS | Status: DC

## 2021-03-18 MED ORDER — FENTANYL CITRATE PF 50 MCG/ML IJ SOSY
PREFILLED_SYRINGE | INTRAMUSCULAR | Status: AC
  Filled 2021-03-18: qty 1

## 2021-03-18 SURGICAL SUPPLY — 9 items
BIOPATCH RED 1 DISK 7.0 (GAUZE/BANDAGES/DRESSINGS) ×1 IMPLANT
CANNULA 5F STIFF (CANNULA) ×1 IMPLANT
CATH GLIDEPATH RETRO 14.5X19 (CATHETERS) ×1 IMPLANT
COVER PROBE U/S 5X48 (MISCELLANEOUS) ×1 IMPLANT
DERMABOND ADVANCED (GAUZE/BANDAGES/DRESSINGS) ×1
DERMABOND ADVANCED .7 DNX12 (GAUZE/BANDAGES/DRESSINGS) IMPLANT
PACK ANGIOGRAPHY (CUSTOM PROCEDURE TRAY) ×1 IMPLANT
SUT MNCRL AB 4-0 PS2 18 (SUTURE) ×1 IMPLANT
SUT PROLENE 0 CT 1 30 (SUTURE) ×1 IMPLANT

## 2021-03-18 NOTE — Assessment & Plan Note (Signed)
HR controlled.   Continue metoprolol. Telemetry.

## 2021-03-18 NOTE — Assessment & Plan Note (Addendum)
Continue ceftazidine and vancomycin. PD catheter removed by vascular surgery on 1/27. Follow cultures.  1/29: awaiting decision for DC abx.  ??need ID consult on Monday to assist in final ABX plans.

## 2021-03-18 NOTE — Assessment & Plan Note (Addendum)
Mgmt per nephrology. PD patient with SBP requiring removal of PD catheter -done on 1/27.   Right IJ dialysis catheter placed on 03/18/21.   Dialysis per nephrology. Outpatient dialysis will be at [facility] 12:25, to start 1/31 if pt out of hospital at that time.

## 2021-03-18 NOTE — Assessment & Plan Note (Addendum)
Continue metoprolol. Hold hydralazine, irbesartan with soft BP's, resume when indicated.  1/29 - BP Stable.

## 2021-03-18 NOTE — Progress Notes (Signed)
[place], [place] 03/18/21  Subjective:   Hospital day # 4  Patient resting in bed, continues to complain of abdominal pain PD treatment received overnight, tolerated well Answered patient's question regarding antibiotic regimen spoke to patient's wife on the telephone and informed her of the proposed treatment plan.   Objective:  Vital signs in last 24 hours:  Temp:  [97.9 F (36.6 C)-98.9 F (37.2 C)] 98.4 F (36.9 C) (01/25 0813) Pulse Rate:  [76-101] 94 (01/25 0813) Resp:  [16-25] 17 (01/25 0813) BP: (124-152)/(90-104) 133/98 (01/25 0813) SpO2:  [93 %-96 %] 94 % (01/25 0813) Weight:  [58.4 kg] 58.4 kg (01/24 1817)  Weight change: 2.8 kg Filed Weights   03/16/21 0502 03/16/21 2041 03/17/21 1817  Weight: 57.3 kg 55.6 kg 58.4 kg    Intake/Output:    Intake/Output Summary (Last 24 hours) at 03/18/2021 1414 Last data filed at 03/18/2021 1038 Gross per 24 hour  Intake 10000 ml  Output 10668 ml  Net -668 ml     Physical Exam: General:  No acute distress, laying in the bed  HEENT  anicteric, moist oral mucous membrane  Pulm/lungs  normal breathing effort, lungs are clear to auscultation  CVS/Heart  regular rhythm, no rub or gallop  Abdomen:   Soft, diffusely tender, PD cathter in place  Extremities:  No peripheral edema  Neurologic:  Alert, oriented, able to follow commands  Skin:  No acute rashes       Basic Metabolic Panel:  Recent Labs  Lab 03/14/21 1025 03/14/21 2120 03/15/21 0808 03/16/21 0400 03/17/21 0413 03/18/21 0417  NA 138  --  138 137 138 138  K 3.4*  --  3.5 3.0* 3.5 3.2*  CL 97*  --  96* 101 99 99  CO2 28  --  25 23 26 26   GLUCOSE 106*  --  98 111* 122* 109*  BUN 71*  --  72* 71* 72* 68*  CREATININE 8.74* 9.40* 8.24* 8.05* 8.15* 7.77*  CALCIUM 7.0*  --  6.4* 6.3* 6.7* 7.1*  MG  --   --  1.7 1.5* 1.7  --   PHOS  --   --  7.4* 6.8* 7.1* 6.7*      CBC: Recent Labs  Lab 03/14/21 2112 03/15/21 0808  03/16/21 0400 03/17/21 0413 03/18/21 0417  WBC 16.4* 14.1* 12.4* 14.5* 11.7*  NEUTROABS 13.9*  --   --   --   --   HGB 10.2* 9.2* 8.9* 8.9* 9.5*  HCT 32.3* 29.4* 28.8* 29.5* 31.3*  MCV 86.6 86.2 85.2 86.5 88.7  PLT 590* 538* 493* 467* 481*       Lab Results  Component Value Date   HEPBSAG NON REACTIVE 12/27/2019   HEPBSAB NON REACTIVE 12/27/2019   HEPBIGM NON REACTIVE 12/27/2019      Microbiology:  Recent Results (from the past 240 hour(s))  Resp Panel by RT-PCR (Flu A&B, Covid) Nasopharyngeal Swab     Status: None   Collection Time: 03/14/21  5:14 PM   Specimen: Nasopharyngeal Swab; Nasopharyngeal(NP) swabs in vial transport medium  Result Value Ref Range Status   SARS Coronavirus 2 by RT PCR NEGATIVE NEGATIVE Final    Comment: (NOTE) SARS-CoV-2 target nucleic acids are NOT DETECTED.  The SARS-CoV-2 RNA is generally detectable in upper respiratory specimens during the acute phase of infection. The lowest concentration of SARS-CoV-2 viral copies this assay can detect is 138 copies/mL. A negative result does not preclude SARS-Cov-2 infection and should not  be used as the sole basis for treatment or other patient management decisions. A negative result may occur with  improper specimen collection/handling, submission of specimen other than nasopharyngeal swab, presence of viral mutation(s) within the areas targeted by this assay, and inadequate number of viral copies(<138 copies/mL). A negative result must be combined with clinical observations, patient history, and epidemiological information. The expected result is Negative.  Fact Sheet for Patients:  EntrepreneurPulse.com.au  Fact Sheet for Healthcare Providers:  IncredibleEmployment.be  This test is no t yet approved or cleared by the [place] FDA and  has been authorized for detection and/or diagnosis of SARS-CoV-2 by FDA under an Emergency Use Authorization (EUA). This  EUA will remain  in effect (meaning this test can be used) for the duration of the COVID-19 declaration under Section 564(b)(1) of the Act, 21 U.S.C.section 360bbb-3(b)(1), unless the authorization is terminated  or revoked sooner.       Influenza A by PCR NEGATIVE NEGATIVE Final   Influenza B by PCR NEGATIVE NEGATIVE Final    Comment: (NOTE) The Xpert Xpress SARS-CoV-2/FLU/RSV plus assay is intended as an aid in the diagnosis of influenza from Nasopharyngeal swab specimens and should not be used as a sole basis for treatment. Nasal washings and aspirates are unacceptable for Xpert Xpress SARS-CoV-2/FLU/RSV testing.  Fact Sheet for Patients: EntrepreneurPulse.com.au  Fact Sheet for Healthcare Providers: IncredibleEmployment.be  This test is not yet approved or cleared by the [place] FDA and has been authorized for detection and/or diagnosis of SARS-CoV-2 by FDA under an Emergency Use Authorization (EUA). This EUA will remain in effect (meaning this test can be used) for the duration of the COVID-19 declaration under Section 564(b)(1) of the Act, 21 U.S.C. section 360bbb-3(b)(1), unless the authorization is terminated or revoked.  Performed at [facility], [address] [place], [place] [address]   Blood Culture (routine x 2)     Status: None (Preliminary result)   Collection Time: 03/14/21  5:14 PM   Specimen: BLOOD  Result Value Ref Range Status   Specimen Description BLOOD RIGHT ANTECUBITAL  Final   Special Requests   Final    BOTTLES DRAWN AEROBIC AND ANAEROBIC Blood Culture adequate volume   Culture   Final    NO GROWTH 3 DAYS Performed at [facility], [address], [place], [place] [address]    Report Status PENDING  Incomplete  Urine Culture     Status: Abnormal   Collection Time: 03/14/21  5:20 PM   Specimen: Urine, Random  Result Value Ref Range Status   Specimen Description   Final    URINE,  RANDOM Performed at [facility], [address], [place], [place] [address]    Special Requests   Final    NONE Performed at [facility], [address] [place], [place] [address]    Culture (A)  Final    <10,000 COLONIES/mL INSIGNIFICANT GROWTH Performed at [facility], [place] [address] [place], [place] [address]    Report Status 03/17/2021 FINAL  Final  Body fluid culture w Gram Stain     Status: None   Collection Time: 03/14/21  7:19 PM   Specimen: Peritoneal Dialysate; Body Fluid  Result Value Ref Range Status   Specimen Description   Final    PERITONEAL DIALYSATE Performed at [facility], [address] [place],  [address]    Special Requests   Final    Normal Performed at [facility],  [place], [place] [address]    Gram Stain   Final    MODERATE WBC PRESENT,BOTH PMN AND MONONUCLEAR NO ORGANISMS SEEN    Culture   Final    NO GROWTH 3 DAYS Performed at [place] [address], [place], [place] [address]    Report Status 03/18/2021 FINAL  Final  Blood Culture (routine x 2)     Status: None (Preliminary result)   Collection Time: 03/14/21  9:12 PM   Specimen: BLOOD  Result Value Ref Range Status   Specimen Description BLOOD RIGHT ANTECUBITAL  Final   Special Requests   Final    BOTTLES DRAWN AEROBIC AND ANAEROBIC Blood Culture adequate volume   Culture   Final    NO GROWTH 3 DAYS Performed at [facility], [address] [place], [place] [address]    Report Status PENDING  Incomplete    Coagulation Studies: No results for input(s): LABPROT, INR in the last 72 hours.   Urinalysis: No results for input(s): COLORURINE, LABSPEC, PHURINE, GLUCOSEU, HGBUR, BILIRUBINUR, KETONESUR, PROTEINUR, UROBILINOGEN, NITRITE, LEUKOCYTESUR in the last 72 hours.  Invalid input(s): APPERANCEUR     Imaging: No results found.   Medications:    ceFAZolin     sodium chloride       ceFAZolin (ANCEF) IV     cefTAZidime (FORTAZ)  IV 1 g (03/18/21 1153)   dialysis solution 1.5% low-MG/low-CA     sodium chloride      aspirin EC  81 mg Oral Daily   atorvastatin  40 mg Oral Daily   calcitRIOL  0.5 mcg Oral Daily   calcium carbonate  1,250 mg Oral BID WC   Chlorhexidine Gluconate Cloth  6 each Topical Daily   epoetin (EPOGEN/PROCRIT) injection  10,000 Units Subcutaneous Weekly   gentamicin cream  1 application Topical Daily   heparin  5,000 Units Subcutaneous Q8H   magnesium oxide  400 mg Oral Daily   metoprolol succinate  50 mg Oral Daily   nystatin  5 mL Oral TID   potassium chloride  20 mEq Oral Daily   potassium chloride  40 mEq Oral Once   Ensure Max Protein  11 oz Oral BID   sucroferric oxyhydroxide  1,000 mg Oral TID   Vitamin D (Ergocalciferol)  50,000 Units Oral Q Sat   acetaminophen, benzonatate, bisacodyl, heparin, ondansetron **OR** ondansetron (ZOFRAN) IV, oxyCODONE  Assessment/ Plan:  66 y.o. male with   end stage renal disease on peritoneal dialysis, hypertension, hyperlipidemia, coronary artery disease status post CABG, history of bladder cancer, COPD with ongoing tobacco use, who is    admitted on 03/14/2021 for Spontaneous bacterial peritonitis ([place]) [K65.2] Generalized abdominal pain [R10.84] Gallbladder sludge [K82.8] Sepsis ([place]) [A41.9] Sepsis, due to unspecified organism, unspecified whether acute organ dysfunction present ([place]) [A41.9]  [facility] PD [employer]  #End-stage renal disease on peritoneal dialysis Peritoneal dialysis received overnight, tolerated well with negative UF.  Consulted vascular about possible PermCath placement today or tomorrow to begin initiation to hemodialysis.  Discussed this with patient and wife and they are both agreeable to this plan.  Once placed, patient we will begin hemodialysis tomorrow.  Thus will not have a peritoneal dialysis treatment tonight.  Patient will receive antibiotics during  hemodialysis.  #Acute peritonitis Patient was admitted last week for the same problem and was discharged on January 16. Patient claims he was taking antibiotics at home as prescribed, but now admits to missing 2-3 antibiotic treatments  Currently being treated with IV ceftazidime daily and vancomycin Pharmacy consult for dosing assistance  Follow PD fluid cell count periodically  #Anemia of chronic kidney disease Lab Results  Component Value Date   HGB 9.5 (L) 03/18/2021  hemoglobin at goal. Subcu Epogen weekly while hospitalized.  #Secondary hyperparathyroidism Lab Results  Component Value Date   PTH 159 (H) 12/27/2019   CALCIUM 7.1 (L) 03/18/2021   CAION 0.74 (LL) 01/20/2021   PHOS 6.7 (H) 03/18/2021  Monitor calcium and phosphorus during this admission  Calcium continues to improve slowly. Continue calcium carbonate and calcitriol.    LOS: [place] 1/25/20232:14 PM  [place], [place]  Note: This note was prepared with Dragon dictation. Any transcription errors are unintentional

## 2021-03-18 NOTE — Progress Notes (Signed)
Progress Note   Patient: Anthony Hess ZOX:[id number] DOB: December 14, 1955 DOA: 03/14/2021     4 DOS: the patient was seen and examined on 03/18/2021   Brief hospital course: [name] is a 66 y.o. male with Past medical history of CAD s/p CABG, HTN, CHF, ESRD on PD, history of bladder cancer, hyperparathyroidism, anemia of chronic disease, recently discharged on 1/16, after management for SBP. Patient presented back in the ED secondary to worsening of abdominal pain. In the ED, BP soft 99/81, saturating well on room air. Labs procalcitonin 1.35, WBC 15.7. CT abdomen pelvis with possible enteritis, ascites, pneumoperitoneum due to PD catheter, edema, 3.2 cm AAA. Pt admitted for further management.    Assessment and Plan * Sepsis ([name])- (present on admission) POA with hypotension, leukocytosis, lactic acidosis.  Procalcitonin was elevated. Sepsis physiology improved. --Continue antibiotics per ID --Follow cultures --Monitor hemodynamics  Spontaneous bacterial peritonitis ([name])- (present on admission) Continue ceftazidine and vancomycin. PD catheter to be removed after HD cath is placed. Follow cultures.  ESRD (end stage renal disease) ([name])- (present on admission) Mgmt per nephrology. PD patient with SBP requiring removal of PD catheter.  HD catheter placement planned for transition to hemodialysis.    Anemia in ESRD (end-stage renal disease) ([name])- (present on admission) Hbg stable, slightly low from baseline. On Epo weekly per nephrology. --Monitor CBC  Atrial fibrillation ([name])- (present on admission) HR controlled.   Continue metoprolol. Telemetry.  Hyperlipidemia- (present on admission) Continue statin  S/P CABG x 4 Stable, no chest pain.  Continue ASA, statin  Hypertension- (present on admission) Continue metoprolol. Hold hydralazine, irbesartan with soft BP's, resume when indicated.  AAA (abdominal aortic aneurysm)- (present on admission) Per CT abd/pelvis on  03/14/2021: Abdominal aortic aneurysm measuring 3.2 cm in maximal diameter again noted and unchanged. --Recommended U/S every 3 years   History of bladder cancer S/p transurethral resection     Subjective: Patient awake resting in bed when seen today.  He reports ongoing diffuse abdominal pain and just feeling poorly.  No nausea vomiting fevers or chills.  He reports less urine output than usual for him.  No other acute complaints at this time.  Objective Vitals reviewed and notable for soft blood pressures yesterday have improved.  BP 133/98.  No fevers.  No hypoxia.  Intermittent tachypnea appears resolved.  General exam: awake, alert, no acute distress, chronically ill-appearing HEENT: atraumatic, clear conjunctiva, anicteric sclera, moist mucus membranes, hearing grossly normal  Respiratory system: CTAB with diminished bases, no wheezes, rales or rhonchi, normal respiratory effort. Cardiovascular system: normal S1/S2, RRR, no pedal edema.   Gastrointestinal system: Distended, diffusely tender on palpation, peritoneal dialysis catheter present, +bowel sounds. Central nervous system: A&O x3. no gross focal neurologic deficits, normal speech Extremities: moves all, no edema, normal tone Skin: dry, intact, normal temperature Psychiatry: normal mood, congruent affect, judgement and insight appear normal   Data Reviewed:  Labs reviewed and notable for potassium 3.2, glucose 109, BUN 68, creatinine 7.77, calcium 7.1, phosphorus 6.7, albumin less than 1.5, white count improved to 11.7, hemoglobin improved from 8.9 up to 9.5, platelets 41  Family Communication: None at bedside during encounter, will attempt to call  Disposition: Status is: Inpatient  Remains inpatient appropriate because: Severity of illness on IV antibiotics pending cultures, placement of hemodialysis catheter, and removal of peritoneal dialysis catheter         Time spent: 35 minutes  Author: Ezekiel Slocumb, DO 03/18/2021 1:04 PM  For on call review [email or web address].

## 2021-03-18 NOTE — Op Note (Signed)
OPERATIVE NOTE    PRE-OPERATIVE DIAGNOSIS: 1. ESRD   POST-OPERATIVE DIAGNOSIS: same as above  PROCEDURE: Ultrasound guidance for vascular access to the right internal jugular vein Fluoroscopic guidance for placement of catheter Right jugular venogram and superior venacavogram Placement of a 19 cm tip to cuff tunneled hemodialysis catheter via the right internal jugular vein  SURGEON: [name], MD  ANESTHESIA:  Local with Moderate conscious sedation for approximately 19 minutes using 2 mg of Versed and 50 mcg of Fentanyl  ESTIMATED BLOOD LOSS: 5 cc  FLUORO TIME: less than one minute  CONTRAST: 5 cc  FINDING(S): 1.  Patent right internal jugular vein although it was quite tortuous  SPECIMEN(S):  None  INDICATIONS:   Anthony Hess is a 66 y.o.male who presents with renal failure.  The patient needs long term dialysis access for their ESRD, and a Permcath is necessary.  Risks and benefits are discussed and informed consent is obtained.    DESCRIPTION: After obtaining full informed written consent, the patient was brought back to the vascular suited. The patient's right neck and chest were sterilely prepped and draped in a sterile surgical field was created. Moderate conscious sedation was administered during a face to face encounter with the patient throughout the procedure with my supervision of the RN administering medicines and monitoring the patient's vital signs, pulse oximetry, telemetry and mental status throughout from the start of the procedure until the patient was taken to the recovery room.  The right internal jugular vein was visualized with ultrasound and found to be patent. It was then accessed under direct ultrasound guidance and a permanent image was recorded. A J-wire would not initially traverse across the clavicle.  I then performed a venogram through the needle to evaluate the right jugular vein and the superior vena cava.  Right jugular vein was patent although was  quite tortuous and curved across the clavicle and superior vena cava was widely patent.  I then used a micropuncture wire and navigated through this tortuosity without difficulty and then placed a micropuncture sheath exchanged for a J-wire. After skin nick and dilatation, the peel-away sheath was placed over the wire. I then turned my attention to an area under the clavicle. Approximately 1-2 fingerbreadths below the clavicle a small counterincision was created and tunneled from the subclavicular incision to the access site. Using fluoroscopic guidance, a 19 centimeter tip to cuff tunneled hemodialysis catheter was selected, and tunneled from the subclavicular incision to the access site. It was then placed through the peel-away sheath and the peel-away sheath was removed. Using fluoroscopic guidance the catheter tips were parked in the right atrium. The appropriate distal connectors were placed. It withdrew blood well and flushed easily with heparinized saline and a concentrated heparin solution was then placed. It was secured to the chest wall with 2 Prolene sutures. The access incision was closed single 4-0 Monocryl. A 4-0 Monocryl pursestring suture was placed around the exit site. Sterile dressings were placed. The patient tolerated the procedure well and was taken to the recovery room in stable condition.  COMPLICATIONS: None  CONDITION: Stable  [name], MD 03/18/2021 4:11 PM   This note was created with Dragon Medical transcription system. Any errors in dictation are purely unintentional.

## 2021-03-18 NOTE — Hospital Course (Addendum)
Anthony Hess is a 66 y.o. male with Past medical history of CAD s/p CABG, HTN, CHF, ESRD on PD, history of bladder cancer, hyperparathyroidism, anemia of chronic disease, recently discharged on 1/16, after management for SBP. Patient presented back in the ED secondary to worsening of abdominal pain. In the ED, BP soft 99/81, saturating well on room air. Labs procalcitonin 1.35, WBC 15.7. CT abdomen pelvis with possible enteritis, ascites, pneumoperitoneum due to PD catheter, edema, 3.2 cm AAA. Pt admitted for further management.    1/28: PD catheter removed yesterday.  Outpatient dialysis arranged, can start 1/31.  Awaiting culture data and final antibiotic recommendations prior to discharge.

## 2021-03-18 NOTE — Assessment & Plan Note (Signed)
Stable, no chest pain.  Continue ASA, statin

## 2021-03-18 NOTE — Assessment & Plan Note (Addendum)
POA with hypotension, leukocytosis, lactic acidosis.  Procalcitonin was elevated. Sepsis physiology improved. Blood cultures from admission negative --Continue antibiotics per ID --Follow peritoneal dialysis await culture -negative to date --Monitor hemodynamics  1/29: resolved sepsis.

## 2021-03-18 NOTE — Assessment & Plan Note (Signed)
Hbg stable, slightly low from baseline. On Epo weekly per nephrology. --Monitor CBC

## 2021-03-18 NOTE — Assessment & Plan Note (Signed)
Continue statin. 

## 2021-03-18 NOTE — Assessment & Plan Note (Signed)
S/p transurethral resection

## 2021-03-18 NOTE — Assessment & Plan Note (Signed)
Per CT abd/pelvis on 03/14/2021: Abdominal aortic aneurysm measuring 3.2 cm in maximal diameter again noted and unchanged. --Recommended U/S every 3 years

## 2021-03-19 ENCOUNTER — Encounter: Payer: Self-pay | Admitting: Vascular Surgery

## 2021-03-19 DIAGNOSIS — Z992 Dependence on renal dialysis: Secondary | ICD-10-CM | POA: Diagnosis not present

## 2021-03-19 DIAGNOSIS — I2510 Atherosclerotic heart disease of native coronary artery without angina pectoris: Secondary | ICD-10-CM

## 2021-03-19 DIAGNOSIS — K652 Spontaneous bacterial peritonitis: Secondary | ICD-10-CM

## 2021-03-19 DIAGNOSIS — D638 Anemia in other chronic diseases classified elsewhere: Secondary | ICD-10-CM | POA: Diagnosis not present

## 2021-03-19 DIAGNOSIS — T8571XD Infection and inflammatory reaction due to peritoneal dialysis catheter, subsequent encounter: Secondary | ICD-10-CM | POA: Diagnosis not present

## 2021-03-19 LAB — BASIC METABOLIC PANEL
Anion gap: 12 (ref 5–15)
BUN: 75 mg/dL — ABNORMAL HIGH (ref 8–23)
CO2: 24 mmol/L (ref 22–32)
Calcium: 7.1 mg/dL — ABNORMAL LOW (ref 8.9–10.3)
Chloride: 101 mmol/L (ref 98–111)
Creatinine, Ser: 8.81 mg/dL — ABNORMAL HIGH (ref 0.61–1.24)
GFR, Estimated: 6 mL/min — ABNORMAL LOW (ref >60)
Glucose, Bld: 119 mg/dL — ABNORMAL HIGH (ref 70–99)
Potassium: 3.5 mmol/L (ref 3.5–5.1)
Sodium: 137 mmol/L (ref 135–145)

## 2021-03-19 LAB — RENAL FUNCTION PANEL
Albumin: <1.5 g/dL — ABNORMAL LOW (ref 3.5–5.0)
Anion gap: 13 (ref 5–15)
BUN: 76 mg/dL — ABNORMAL HIGH (ref 8–23)
CO2: 24 mmol/L (ref 22–32)
Calcium: 7.1 mg/dL — ABNORMAL LOW (ref 8.9–10.3)
Chloride: 101 mmol/L (ref 98–111)
Creatinine, Ser: 8.71 mg/dL — ABNORMAL HIGH (ref 0.61–1.24)
GFR, Estimated: 6 mL/min — ABNORMAL LOW (ref >60)
Glucose, Bld: 115 mg/dL — ABNORMAL HIGH (ref 70–99)
Phosphorus: 7.4 mg/dL — ABNORMAL HIGH (ref 2.5–4.6)
Potassium: 3.6 mmol/L (ref 3.5–5.1)
Sodium: 138 mmol/L (ref 135–145)

## 2021-03-19 LAB — HEPATITIS B SURFACE ANTIBODY, QUANTITATIVE: Hep B S AB Quant (Post): 47 m[IU]/mL (ref >9.9)

## 2021-03-19 LAB — CBC
HCT: 29 % — ABNORMAL LOW (ref 39.0–52.0)
Hemoglobin: 8.7 g/dL — ABNORMAL LOW (ref 13.0–17.0)
MCH: 26.7 pg (ref 26.0–34.0)
MCHC: 30 g/dL (ref 30.0–36.0)
MCV: 89 fL (ref 80.0–100.0)
Platelets: 424 10*3/uL — ABNORMAL HIGH (ref 150–400)
RBC: 3.26 MIL/uL — ABNORMAL LOW (ref 4.22–5.81)
RDW: 15.6 % — ABNORMAL HIGH (ref 11.5–15.5)
WBC: 13.4 10*3/uL — ABNORMAL HIGH (ref 4.0–10.5)
nRBC: 0 % (ref 0.0–0.2)

## 2021-03-19 LAB — CULTURE, BLOOD (ROUTINE X 2)
Culture: NO GROWTH
Culture: NO GROWTH
Special Requests: ADEQUATE
Special Requests: ADEQUATE

## 2021-03-19 MED ORDER — PENTAFLUOROPROP-TETRAFLUOROETH EX AERO: 1.0000 "application " | INHALATION_SPRAY | CUTANEOUS | Status: DC | PRN

## 2021-03-19 MED ORDER — ALTEPLASE 2 MG IJ SOLR: 2.0000 mg | Freq: Once | INTRAMUSCULAR | Status: DC | PRN

## 2021-03-19 MED ORDER — SODIUM CHLORIDE 0.9 % IV SOLN: 100.0000 mL | INTRAVENOUS | Status: DC | PRN

## 2021-03-19 MED ORDER — HEPARIN SODIUM (PORCINE) 1000 UNIT/ML IJ SOLN
INTRAMUSCULAR | Status: AC
  Filled 2021-03-19: qty 10

## 2021-03-19 MED ORDER — VANCOMYCIN HCL 500 MG/100ML IV SOLN
500.0000 mg | Freq: Once | INTRAVENOUS | Status: AC
  Administered 2021-03-19: 500 mg via INTRAVENOUS
  Filled 2021-03-19: qty 100

## 2021-03-19 MED ORDER — LIDOCAINE-PRILOCAINE 2.5-2.5 % EX CREA: 1.0000 "application " | TOPICAL_CREAM | CUTANEOUS | Status: DC | PRN

## 2021-03-19 MED ORDER — LIDOCAINE HCL (PF) 1 % IJ SOLN: 5.0000 mL | INTRAMUSCULAR | Status: DC | PRN

## 2021-03-19 MED ORDER — HEPARIN SODIUM (PORCINE) 1000 UNIT/ML DIALYSIS: 1000.0000 [IU] | INTRAMUSCULAR | Status: DC | PRN

## 2021-03-19 NOTE — Progress Notes (Signed)
Have been asked to remove PD catheter Will schedule for tomorrow

## 2021-03-19 NOTE — Progress Notes (Signed)
Patient returned from HD.

## 2021-03-19 NOTE — Progress Notes (Signed)
Patient scheduled HD treatment with newly placed RIJ catheter. Patient resumes HD modality, has been doing PD, currently being treated for an infection. Catheter initially functions well, maintaining BFR, however, increasing arterial and venous pressure caused dialyzer to clot, thus unable to complete 2-hour treatment.  Blood was return, antibiotic which was in the process of being transfused stopped. Patient will return for day 2 of new HD start, with plans of hepatization of dialyzer. Patient returned to assigned room.

## 2021-03-19 NOTE — Progress Notes (Signed)
Peritoneal dialysis patient known at [facility] ([place]), patient is transitioning to Hemodialysis and is needing outpatient placement. Patient would prefer to stay at [facility]. Checking with clinic for capacity. Clinic stated they will follow up in the morning. Patient stated that if [facility] can not accommodate, then next choice is [facility].

## 2021-03-19 NOTE — Progress Notes (Signed)
Date of Admission:  03/14/2021     ID: Anthony Hess is a 66 y.o. male Principal Problem:   Sepsis ([place]) Active Problems:   History of bladder cancer   Hypertension   S/P CABG x 4   Atrial fibrillation ([facility])   ESRD (end stage renal disease) ([place])   Anemia in ESRD (end-stage renal disease) ([place])   Hyperlipidemia   Spontaneous bacterial peritonitis ([place])   AAA (abdominal aortic aneurysm)    Subjective: Patient sitting in chair. Continues to have abdominal pain but is better. Underwent r placement of right IJ dialysis catheter yesterday.  Awaiting removal of PD catheter  Medications:   aspirin EC  81 mg Oral Daily   atorvastatin  40 mg Oral Daily   calcitRIOL  0.5 mcg Oral Daily   calcium carbonate  1,250 mg Oral BID WC   Chlorhexidine Gluconate Cloth  6 each Topical Daily   epoetin (EPOGEN/PROCRIT) injection  10,000 Units Subcutaneous Weekly   gentamicin cream  1 application Topical Daily   heparin  5,000 Units Subcutaneous Q8H   magnesium oxide  400 mg Oral Daily   metoprolol succinate  50 mg Oral Daily   nystatin  5 mL Oral TID   potassium chloride  20 mEq Oral Daily   Ensure Max Protein  11 oz Oral BID   sucroferric oxyhydroxide  1,000 mg Oral TID   Vitamin D (Ergocalciferol)  50,000 Units Oral Q Sat    Objective: Vital signs in last 24 hours: Temp:  [98 F (36.7 C)-98.6 F (37 C)] 98.6 F (37 C) (01/26 1601) Pulse Rate:  [73-96] 91 (01/26 1601) Resp:  [16-20] 18 (01/26 1601) BP: (92-150)/(69-99) 92/69 (01/26 1601) SpO2:  [92 %-100 %] 95 % (01/26 1601) Weight:  [57.2 kg] 57.2 kg (01/26 1030)  PHYSICAL EXAM:  General: Alert, cooperative, no distress, emaciated Head: Normocephalic, without obvious abnormality, atraumatic. Eyes: Conjunctivae clear, anicteric sclerae. Pupils are equal ENT Nares normal. No drainage or sinus tenderness. Lips, mucosa, and tongue normal. No Thrush Neck: Supple, symmetrical, no adenopathy, thyroid: non tender.  Right IJ  catheter no carotid bruit and no JVD. Back: No CVA tenderness. Lungs: Clear to auscultation bilaterally. No Wheezing or Rhonchi. No rales. Heart: Regular rate and rhythm, no murmur, rub or gallop. Abdomen: Soft,  distended.  Tender at the site of dialysis catheter.  Extremities: atraumatic, no cyanosis. No edema. No clubbing Skin: No rashes or lesions. Or bruising Lymph: Cervical, supraclavicular normal. Neurologic: Grossly non-focal  Lab Results Recent Labs    03/18/21 0417 03/19/21 0418  WBC 11.7* 13.4*  HGB 9.5* 8.7*  HCT 31.3* 29.0*  NA 138 138   137  K 3.2* 3.6   3.5  CL 99 101   101  CO2 26 24   24   BUN 68* 76*   75*  CREATININE 7.77* 8.71*   8.81*   Liver Panel Recent Labs    03/18/21 0417 03/19/21 0418  ALBUMIN <1.5* <1.5*   Sedimentation Rate No results for input(s): ESRSEDRATE in the last 72 hours. C-Reactive Protein No results for input(s): CRP in the last 72 hours.  Microbiology:  Studies/Results: PERIPHERAL VASCULAR CATHETERIZATION  Result Date: 03/18/2021 See surgical note for result.    Assessment/Plan: Bacterial peritonitis secondary to peritoneal dialysis catheter Has had chronic leukocytosis since earlier this month in spite of antibiotics. Need to remove the infected dialysis catheter which is keeping the leukocytosis up .  Continue IV vancomycin and ceftazidime   Recent coag negative staph  PD catheter related peritonitis Patient was discharged on cefazolin but I am not sure whether he was taking it with the peritoneal dialysis. Prior to that patient was on IV Vanco and ceftazidime at the dialysis center   Anemia of chronic disease CAD status post CABG On metoprolol ?  Discussed the management with the patient and the care team.

## 2021-03-19 NOTE — H&P (View-Only) (Signed)
Have been asked to remove PD catheter Will schedule for tomorrow

## 2021-03-19 NOTE — Progress Notes (Signed)
°  Progress Note   Patient: Anthony Hess VQX:[id number] DOB: Feb 12, 1956 DOA: 03/14/2021     5 DOS: the patient was seen and examined on 03/19/2021   Brief hospital course: Renso Swett is a 66 y.o. male with Past medical history of CAD s/p CABG, HTN, CHF, ESRD on PD, history of bladder cancer, hyperparathyroidism, anemia of chronic disease, recently discharged on 1/16, after management for SBP. Patient presented back in the ED secondary to worsening of abdominal pain. In the ED, BP soft 99/81, saturating well on room air. Labs procalcitonin 1.35, WBC 15.7. CT abdomen pelvis with possible enteritis, ascites, pneumoperitoneum due to PD catheter, edema, 3.2 cm AAA. Pt admitted for further management.    Assessment and Plan * Sepsis ([place])- (present on admission) POA with hypotension, leukocytosis, lactic acidosis.  Procalcitonin was elevated. Sepsis physiology improved. --Continue antibiotics per ID --Follow cultures --Monitor hemodynamics  Spontaneous bacterial peritonitis ([place])- (present on admission) Continue ceftazidine and vancomycin. PD catheter to be removed.   Follow cultures.  ESRD (end stage renal disease) ([place])- (present on admission) Mgmt per nephrology. PD patient with SBP requiring removal of PD catheter.  Right IJ dialysis catheter placed on 03/18/21.  Pt had HD this AM and tolerated well.  Anemia in ESRD (end-stage renal disease) ([place])- (present on admission) Hbg stable, slightly low from baseline. On Epo weekly per nephrology. --Monitor CBC  Atrial fibrillation ([place])- (present on admission) HR controlled.   Continue metoprolol. Telemetry.  Hyperlipidemia- (present on admission) Continue statin  S/P CABG x 4 Stable, no chest pain.  Continue ASA, statin  Hypertension- (present on admission) Continue metoprolol. Hold hydralazine, irbesartan with soft BP's, resume when indicated.  AAA (abdominal aortic aneurysm)- (present on admission) Per CT abd/pelvis on  03/14/2021: Abdominal aortic aneurysm measuring 3.2 cm in maximal diameter again noted and unchanged. --Recommended U/S every 3 years   History of bladder cancer S/p transurethral resection     Subjective: Pt feeling a little better today.  Tolerated HD this AM without issues.  Asks that I call his wife to update her, she is worried.  Pt denies other complaints.  Asks when PD cath will be taken out.   Objective Vitals reviewed, notable for no fevers, BP mildly elevated this AM 150/99, normalized to 118/86.    General exam: awake, alert, no acute distress, chronically ill appearing HEENT: atraumatic, clear conjunctiva, anicteric sclera, moist mucus membranes, hearing grossly normal  Respiratory system: CTAB, no wheezes, rales or rhonchi, normal respiratory effort. Cardiovascular system: normal S1/S2, RRR, R IJ dialysis catheter in place Gastrointestinal system: distended, mildly tender, PD cath in place Central nervous system: A&O x3. no gross focal neurologic deficits, normal speech Extremities: moves all, no edema, normal tone Skin: dry, intact, normal temperature Psychiatry: normal mood, congruent affect, judgement and insight appear normal   Data Reviewed:  Labs reviewed, notable for glucose 115, BUN 76, Cr 8.71, Ca 7.1, Phos 7.4, WBC 13.4, Hbg stable 8.7.  Family Communication: Updated wife by phone today  Disposition: Status is: Inpatient  Remains inpatient appropriate because: severity of illness on IV antibiotics pending cutlures and removal of PD catheter. Awaiting set up for outpatient hemodialysis.          Time spent: 35 minutes  Author: Ezekiel Slocumb, DO 03/19/2021 1:08 PM  For on call review [email or web address].

## 2021-03-19 NOTE — TOC Progression Note (Signed)
Transition of Care [facility]) - Progression Note    Patient Details  Name: Anthony Hess MRN: [id number] Date of Birth: [birth year]  Transition of [facility]) CM/SW Contact  Beverly Sessions, RN Phone Number: 03/19/2021, 2:31 PM  Clinical Narrative:    HD coordinator working on arranging outpatient HD   Expected Discharge Plan: Home/Self Care Barriers to Discharge: Continued Medical Work up  Expected Discharge Plan and Services Expected Discharge Plan: Home/Self Care       Living arrangements for the past 2 months: Single Family Home                                       Social Determinants of Health (SDOH) Interventions    Readmission Risk Interventions Readmission Risk Prevention Plan 03/16/2021 01/09/2020  Transportation Screening Complete Complete  PCP or Specialist Appt within 3-5 Days - Complete  Social Work Consult for [place] Planning/Counseling Complete Complete  Palliative Care Screening Not Applicable Not Applicable  Medication Review [employer]) Complete Complete  Some recent data might be hidden

## 2021-03-19 NOTE — Progress Notes (Signed)
Order received from Dr [name] for consent for HD

## 2021-03-19 NOTE — Progress Notes (Signed)
Pharmacy Antibiotic Note  Anthony Hess is a 66 y.o. male w/ PMH of CAD s/p CABG, HTN, A. fib, CHF, ESRD on PD, history of bladder cancer, hyperparathyroidism, anemia of chronic disease admitted on 03/14/2021 with SBP.  Pharmacy has been consulted for vancomycin and ceftazidime dosing. On admission he received 1250 mg IV vancomycin (no doses since based on supratherapeutic levels) and 1000 mg IV ceftazidime. Since admission his PD catheter has been removed and today he underwent his first HD session (3h of HD at BFR at 200/hr) estimating ~6% removal or total 18% removal total for anticipated post-HD Level of 18-20 mcg/mL  Plan:  1) will order a vancomycin 500 mg IV x 1 level with am labs 01/27 redose vancomycin when level is 15 - 25 mcg/mL  2) continue ceftazidime 1 gram IV every 24 hours timed after HD   Height: 5\' 3"  (160 cm) Weight: 58.4 kg (128 lb 12 oz) IBW/kg (Calculated) : 56.9  Temp (24hrs), Avg:98.3 F (36.8 C), Min:98 F (36.7 C), Max:98.6 F (37 C)  Recent Labs  Lab 03/14/21 1025 03/14/21 2112 03/14/21 2120 03/15/21 0808 03/16/21 0400 03/17/21 0413 03/18/21 0417 03/19/21 0418  WBC 15.7* 16.4*  --  14.1* 12.4* 14.5* 11.7* 13.4*  CREATININE 8.74*  --    < > 8.24* 8.05* 8.15* 7.77* 8.71*   8.81*  LATICACIDVEN 2.0* 1.3  --   --   --   --   --   --   VANCORANDOM  --   --   --   --   --  28 24  --    < > = values in this interval not displayed.     Estimated Creatinine Clearance: 7 mL/min (A) (by C-G formula based on SCr of 8.71 mg/dL (H)).    No Known Allergies  Antimicrobials this admission: 01/21 ceftazidime >>  01/21 vancomycin >>   Microbiology results: 01/21 BCx: NG final 01/21 WCx (peritoneal dialysate) : NG final 01/21 UCx: insignificant growth 01/21 SARS CoV-2: negative 01/21 influenza A/B: negative    Thank you for allowing pharmacy to be a part of this patients care.  Dallie Piles 03/19/2021 7:37 AM

## 2021-03-19 NOTE — Progress Notes (Signed)
[facility], [place] 03/19/21  Subjective:   Hospital day # 5  Patient seen and evaluated during dialysis   HEMODIALYSIS FLOWSHEET:  Blood Flow Rate (mL/min): 200 mL/min Arterial Pressure (mmHg): -250 mmHg Venous Pressure (mmHg): 400 mmHg Transmembrane Pressure (mmHg): 90 mmHg Ultrafiltration Rate (mL/min): 280 mL/min Dialysate Flow Rate (mL/min): 200 ml/min Conductivity: Machine : 13.8 Conductivity: Machine : 13.8 Dialysis Fluid Bolus: Normal Saline Bolus Amount (mL): 250 mL  No complaints at this time Currently tolerating treatment well   Objective:  Vital signs in last 24 hours:  Temp:  [98 F (36.7 C)-98.6 F (37 C)] 98.1 F (36.7 C) (01/26 1102) Pulse Rate:  [72-96] 83 (01/26 1102) Resp:  [12-22] 16 (01/26 1102) BP: (95-150)/(67-99) 126/93 (01/26 1102) SpO2:  [92 %-100 %] 94 % (01/26 1102) Weight:  [57.2 kg] 57.2 kg (01/26 1030)  Weight change: 0 kg Filed Weights   03/18/21 1425 03/19/21 0844 03/19/21 1030  Weight: 58.4 kg 57.2 kg 57.2 kg    Intake/Output:    Intake/Output Summary (Last 24 hours) at 03/19/2021 1539 Last data filed at 03/19/2021 1357 Gross per 24 hour  Intake 418.67 ml  Output -215 ml  Net 633.67 ml     Physical Exam: General:  No acute distress, laying in the bed  HEENT  anicteric, moist oral mucous membrane  Pulm/lungs  normal breathing effort, lungs are clear to auscultation  CVS/Heart  regular rhythm, no rub or gallop  Abdomen:   Soft, diffusely tender, PD cathter in place  Extremities:  No peripheral edema  Neurologic:  Alert, oriented, able to follow commands  Skin:  No acute rashes  Access Rt IJ Permcath placed on 03/18/21 by Dr Lucky Cowboy     Basic Metabolic Panel:  Recent Labs  Lab 03/15/21 0808 03/16/21 0400 03/17/21 0413 03/18/21 0417 03/19/21 0418  NA 138 137 138 138 138   137  K 3.5 3.0* 3.5 3.2* 3.6   3.5  CL 96* 101 99 99 101   101  CO2 25 23 26 26 24   24   GLUCOSE 98 111* 122* 109*  115*   119*  BUN 72* 71* 72* 68* 76*   75*  CREATININE 8.24* 8.05* 8.15* 7.77* 8.71*   8.81*  CALCIUM 6.4* 6.3* 6.7* 7.1* 7.1*   7.1*  MG 1.7 1.5* 1.7  --   --   PHOS 7.4* 6.8* 7.1* 6.7* 7.4*      CBC: Recent Labs  Lab 03/14/21 2112 03/15/21 0808 03/16/21 0400 03/17/21 0413 03/18/21 0417 03/19/21 0418  WBC 16.4* 14.1* 12.4* 14.5* 11.7* 13.4*  NEUTROABS 13.9*  --   --   --   --   --   HGB 10.2* 9.2* 8.9* 8.9* 9.5* 8.7*  HCT 32.3* 29.4* 28.8* 29.5* 31.3* 29.0*  MCV 86.6 86.2 85.2 86.5 88.7 89.0  PLT 590* 538* 493* 467* 481* 424*       Lab Results  Component Value Date   HEPBSAG NON REACTIVE 03/18/2021   HEPBSAB NON REACTIVE 12/27/2019   HEPBIGM NON REACTIVE 12/27/2019      Microbiology:  Recent Results (from the past 240 hour(s))  Resp Panel by RT-PCR (Flu A&B, Covid) Nasopharyngeal Swab     Status: None   Collection Time: 03/14/21  5:14 PM   Specimen: Nasopharyngeal Swab; Nasopharyngeal(NP) swabs in vial transport medium  Result Value Ref Range Status   SARS Coronavirus 2 by RT PCR NEGATIVE NEGATIVE Final    Comment: (NOTE) SARS-CoV-2 target nucleic acids  are NOT DETECTED.  The SARS-CoV-2 RNA is generally detectable in upper respiratory specimens during the acute phase of infection. The lowest concentration of SARS-CoV-2 viral copies this assay can detect is 138 copies/mL. A negative result does not preclude SARS-Cov-2 infection and should not be used as the sole basis for treatment or other patient management decisions. A negative result may occur with  improper specimen collection/handling, submission of specimen other than nasopharyngeal swab, presence of viral mutation(s) within the areas targeted by this assay, and inadequate number of viral copies(<138 copies/mL). A negative result must be combined with clinical observations, patient history, and epidemiological information. The expected result is Negative.  Fact Sheet for Patients:   EntrepreneurPulse.com.au  Fact Sheet for Healthcare Providers:  IncredibleEmployment.be  This test is no t yet approved or cleared by the [place] FDA and  has been authorized for detection and/or diagnosis of SARS-CoV-2 by FDA under an Emergency Use Authorization (EUA). This EUA will remain  in effect (meaning this test can be used) for the duration of the COVID-19 declaration under Section 564(b)(1) of the Act, 21 U.S.C.section 360bbb-3(b)(1), unless the authorization is terminated  or revoked sooner.       Influenza A by PCR NEGATIVE NEGATIVE Final   Influenza B by PCR NEGATIVE NEGATIVE Final    Comment: (NOTE) The Xpert Xpress SARS-CoV-2/FLU/RSV plus assay is intended as an aid in the diagnosis of influenza from Nasopharyngeal swab specimens and should not be used as a sole basis for treatment. Nasal washings and aspirates are unacceptable for Xpert Xpress SARS-CoV-2/FLU/RSV testing.  Fact Sheet for Patients: EntrepreneurPulse.com.au  Fact Sheet for Healthcare Providers: IncredibleEmployment.be  This test is not yet approved or cleared by the [place] FDA and has been authorized for detection and/or diagnosis of SARS-CoV-2 by FDA under an Emergency Use Authorization (EUA). This EUA will remain in effect (meaning this test can be used) for the duration of the COVID-19 declaration under Section 564(b)(1) of the Act, 21 U.S.C. section 360bbb-3(b)(1), unless the authorization is terminated or revoked.  Performed at [facility], [address] [place], [place] [address]   Blood Culture (routine x 2)     Status: None   Collection Time: 03/14/21  5:14 PM   Specimen: BLOOD  Result Value Ref Range Status   Specimen Description BLOOD RIGHT ANTECUBITAL  Final   Special Requests   Final    BOTTLES DRAWN AEROBIC AND ANAEROBIC Blood Culture adequate volume   Culture   Final    NO  GROWTH 5 DAYS Performed at [facility], [address], [place], [place] [address]    Report Status 03/19/2021 FINAL  Final  Urine Culture     Status: Abnormal   Collection Time: 03/14/21  5:20 PM   Specimen: Urine, Random  Result Value Ref Range Status   Specimen Description   Final    URINE, RANDOM Performed at [facility], [address], [place], [place] [address]    Special Requests   Final    NONE Performed at [facility], [address], [place], [place] [address]    Culture (A)  Final    <10,000 COLONIES/mL INSIGNIFICANT GROWTH Performed at [facility], [place] [address], [place], [place] [address]    Report Status 03/17/2021 FINAL  Final  Body fluid culture w Gram Stain     Status: None   Collection Time: 03/14/21  7:19 PM   Specimen: Peritoneal Dialysate; Body Fluid  Result Value  Ref Range Status   Specimen Description   Final    PERITONEAL DIALYSATE Performed at [facility], [address] [place], [place] [address]    Special Requests   Final    Normal Performed at [facility], [place], [place] [address]    Gram Stain   Final    MODERATE WBC PRESENT,BOTH PMN AND MONONUCLEAR NO ORGANISMS SEEN    Culture   Final    NO GROWTH 3 DAYS Performed at [facility], [place] [address] [place], [place] [address]    Report Status 03/18/2021 FINAL  Final  Blood Culture (routine x 2)     Status: None   Collection Time: 03/14/21  9:12 PM   Specimen: BLOOD  Result Value Ref Range Status   Specimen Description BLOOD RIGHT ANTECUBITAL  Final   Special Requests   Final    BOTTLES DRAWN AEROBIC AND ANAEROBIC Blood Culture adequate volume   Culture   Final    NO GROWTH 5 DAYS Performed at [facility], [address], [place], [place] [address]    Report Status 03/19/2021 FINAL  Final    Coagulation Studies: No results for input(s): LABPROT, INR in the last 72  hours.   Urinalysis: No results for input(s): COLORURINE, LABSPEC, PHURINE, GLUCOSEU, HGBUR, BILIRUBINUR, KETONESUR, PROTEINUR, UROBILINOGEN, NITRITE, LEUKOCYTESUR in the last 72 hours.  Invalid input(s): APPERANCEUR     Imaging: PERIPHERAL VASCULAR CATHETERIZATION  Result Date: 03/18/2021 See surgical note for result.    Medications:    cefTAZidime (FORTAZ)  IV 1 g (03/19/21 1355)   dialysis solution 1.5% low-MG/low-CA     sodium chloride     vancomycin      aspirin EC  81 mg Oral Daily   atorvastatin  40 mg Oral Daily   calcitRIOL  0.5 mcg Oral Daily   calcium carbonate  1,250 mg Oral BID WC   Chlorhexidine Gluconate Cloth  6 each Topical Daily   epoetin (EPOGEN/PROCRIT) injection  10,000 Units Subcutaneous Weekly   gentamicin cream  1 application Topical Daily   heparin  5,000 Units Subcutaneous Q8H   magnesium oxide  400 mg Oral Daily   metoprolol succinate  50 mg Oral Daily   nystatin  5 mL Oral TID   potassium chloride  20 mEq Oral Daily   Ensure Max Protein  11 oz Oral BID   sucroferric oxyhydroxide  1,000 mg Oral TID   Vitamin D (Ergocalciferol)  50,000 Units Oral Q Sat   acetaminophen, benzonatate, bisacodyl, heparin, ondansetron **OR** ondansetron (ZOFRAN) IV, oxyCODONE  Assessment/ Plan:  66 y.o. male with   end stage renal disease on peritoneal dialysis, hypertension, hyperlipidemia, coronary artery disease status post CABG, history of bladder cancer, COPD with ongoing tobacco use, who is    admitted on 03/14/2021 for Spontaneous bacterial peritonitis ([place]) [K65.2] Generalized abdominal pain [R10.84] Gallbladder sludge [K82.8] Sepsis ([place]) [A41.9] Sepsis, due to unspecified organism, unspecified whether acute organ dysfunction present ([place]) [A41.9]  [facility] [address]  #End-stage renal disease on peritoneal dialysis Appreciate vascular placing right IJ PermCath yesterday.  Patient received first dialysis treatment today, tolerated well.   Treatment terminated 1 hour into initiation due to clotted system.  Next treatment scheduled for tomorrow.  We will prime dialyzer and lines with heparin.  Patient will need PD catheter removed.  Initially placed in [facility].  We will consult surgery to gauge their comfort with removal.  #Acute peritonitis Patient was  admitted last week for the same problem and was discharged on January 16. Patient claims he was taking antibiotics at home as prescribed, but now admits to missing 2-3 antibiotic treatments Currently receiving ceftazidime and vancomycin.  Pharmacy assisting with vancomycin dosing.  Attempted to give ceftazidime during dialysis today.  Unsuccessful due to early termination of treatment.  HD RN reported to staff nurse of this event.  #Anemia of chronic kidney disease Lab Results  Component Value Date   HGB 8.7 (L) 03/19/2021  Hemoglobin below target.  Will transition EPO to IV during treatments.  #Secondary hyperparathyroidism Lab Results  Component Value Date   PTH 159 (H) 12/27/2019   CALCIUM 7.1 (L) 03/19/2021   CALCIUM 7.1 (L) 03/19/2021   CAION 0.74 (LL) 01/20/2021   PHOS 7.4 (H) 03/19/2021   Continue calcium carbonate and calcitriol.    LOS: [place] 1/26/20233:39 PM  [place], [place]  Note: This note was prepared with Dragon dictation. Any transcription errors are unintentional

## 2021-03-20 ENCOUNTER — Inpatient Hospital Stay: Payer: Medicare Other | Admitting: Anesthesiology

## 2021-03-20 ENCOUNTER — Encounter
Admission: EM | Disposition: A | Discharge status: Home / Self Care | Payer: Self-pay | Source: Home / Self Care | Attending: Internal Medicine

## 2021-03-20 ENCOUNTER — Encounter: Payer: Self-pay | Admitting: Student

## 2021-03-20 ENCOUNTER — Other Ambulatory Visit: Payer: Self-pay

## 2021-03-20 DIAGNOSIS — N185 Chronic kidney disease, stage 5: Secondary | ICD-10-CM | POA: Diagnosis not present

## 2021-03-20 DIAGNOSIS — D631 Anemia in chronic kidney disease: Secondary | ICD-10-CM

## 2021-03-20 HISTORY — PX: REMOVAL OF A DIALYSIS CATHETER: SHX6053

## 2021-03-20 LAB — BASIC METABOLIC PANEL
Anion gap: 13 (ref 5–15)
BUN: 73 mg/dL — ABNORMAL HIGH (ref 8–23)
CO2: 24 mmol/L (ref 22–32)
Calcium: 6.9 mg/dL — ABNORMAL LOW (ref 8.9–10.3)
Chloride: 102 mmol/L (ref 98–111)
Creatinine, Ser: 8.29 mg/dL — ABNORMAL HIGH (ref 0.61–1.24)
GFR, Estimated: 7 mL/min — ABNORMAL LOW (ref >60)
Glucose, Bld: 100 mg/dL — ABNORMAL HIGH (ref 70–99)
Potassium: 3.8 mmol/L (ref 3.5–5.1)
Sodium: 139 mmol/L (ref 135–145)

## 2021-03-20 LAB — RENAL FUNCTION PANEL
Albumin: <1.5 g/dL — ABNORMAL LOW (ref 3.5–5.0)
Anion gap: 12 (ref 5–15)
BUN: 75 mg/dL — ABNORMAL HIGH (ref 8–23)
CO2: 23 mmol/L (ref 22–32)
Calcium: 6.8 mg/dL — ABNORMAL LOW (ref 8.9–10.3)
Chloride: 102 mmol/L (ref 98–111)
Creatinine, Ser: 8.54 mg/dL — ABNORMAL HIGH (ref 0.61–1.24)
GFR, Estimated: 6 mL/min — ABNORMAL LOW (ref >60)
Glucose, Bld: 94 mg/dL (ref 70–99)
Phosphorus: 6.2 mg/dL — ABNORMAL HIGH (ref 2.5–4.6)
Potassium: 3.8 mmol/L (ref 3.5–5.1)
Sodium: 137 mmol/L (ref 135–145)

## 2021-03-20 LAB — CBC
HCT: 28 % — ABNORMAL LOW (ref 39.0–52.0)
Hemoglobin: 8.3 g/dL — ABNORMAL LOW (ref 13.0–17.0)
MCH: 26.6 pg (ref 26.0–34.0)
MCHC: 29.6 g/dL — ABNORMAL LOW (ref 30.0–36.0)
MCV: 89.7 fL (ref 80.0–100.0)
Platelets: 405 10*3/uL — ABNORMAL HIGH (ref 150–400)
RBC: 3.12 MIL/uL — ABNORMAL LOW (ref 4.22–5.81)
RDW: 15.6 % — ABNORMAL HIGH (ref 11.5–15.5)
WBC: 10.6 10*3/uL — ABNORMAL HIGH (ref 4.0–10.5)
nRBC: 0 % (ref 0.0–0.2)

## 2021-03-20 LAB — VANCOMYCIN, RANDOM: Vancomycin Rm: 27

## 2021-03-20 SURGERY — REMOVAL, DIALYSIS CATHETER
Anesthesia: General | Site: Abdomen | Laterality: Left

## 2021-03-20 MED ORDER — MIDAZOLAM HCL 2 MG/2ML IJ SOLN
INTRAMUSCULAR | Status: DC | PRN
  Administered 2021-03-20 (×2): 1 mg via INTRAVENOUS

## 2021-03-20 MED ORDER — ONDANSETRON HCL 4 MG/2ML IJ SOLN
INTRAMUSCULAR | Status: AC
  Filled 2021-03-20: qty 2

## 2021-03-20 MED ORDER — PROPOFOL 10 MG/ML IV BOLUS
INTRAVENOUS | Status: AC
  Filled 2021-03-20: qty 20

## 2021-03-20 MED ORDER — EPHEDRINE 5 MG/ML INJ
INTRAVENOUS | Status: AC
  Filled 2021-03-20: qty 5

## 2021-03-20 MED ORDER — VASOPRESSIN 20 UNIT/ML IV SOLN
INTRAVENOUS | Status: AC
  Filled 2021-03-20: qty 1

## 2021-03-20 MED ORDER — RENA-VITE PO TABS
1.0000 | ORAL_TABLET | Freq: Every day | ORAL | Status: DC
  Administered 2021-03-20 – 2021-03-23 (×4): 1 via ORAL
  Filled 2021-03-20 (×4): qty 1

## 2021-03-20 MED ORDER — FENTANYL CITRATE (PF) 100 MCG/2ML IJ SOLN
INTRAMUSCULAR | Status: DC | PRN
  Administered 2021-03-20 (×2): 25 ug via INTRAVENOUS

## 2021-03-20 MED ORDER — LIDOCAINE HCL (PF) 2 % IJ SOLN
INTRAMUSCULAR | Status: AC
  Filled 2021-03-20: qty 5

## 2021-03-20 MED ORDER — MIDAZOLAM HCL 2 MG/2ML IJ SOLN
INTRAMUSCULAR | Status: AC
  Filled 2021-03-20: qty 2

## 2021-03-20 MED ORDER — CEFAZOLIN SODIUM-DEXTROSE 2-4 GM/100ML-% IV SOLN
2.0000 g | INTRAVENOUS | Status: AC
  Administered 2021-03-20: 2 g via INTRAVENOUS

## 2021-03-20 MED ORDER — FENTANYL CITRATE (PF) 100 MCG/2ML IJ SOLN
INTRAMUSCULAR | Status: AC
  Filled 2021-03-20: qty 2

## 2021-03-20 MED ORDER — LACTATED RINGERS IV SOLN: INTRAVENOUS | Status: DC | PRN

## 2021-03-20 MED ORDER — ONDANSETRON HCL 4 MG/2ML IJ SOLN: 4.0000 mg | Freq: Once | INTRAMUSCULAR | Status: DC | PRN

## 2021-03-20 MED ORDER — ETOMIDATE 2 MG/ML IV SOLN
INTRAVENOUS | Status: AC
  Filled 2021-03-20: qty 10

## 2021-03-20 MED ORDER — LIDOCAINE HCL (CARDIAC) PF 100 MG/5ML IV SOSY
PREFILLED_SYRINGE | INTRAVENOUS | Status: DC | PRN
  Administered 2021-03-20: 60 mg via INTRAVENOUS

## 2021-03-20 MED ORDER — ALBUMIN HUMAN 25 % IV SOLN
25.0000 g | Freq: Once | INTRAVENOUS | Status: AC
  Administered 2021-03-21: 25 g via INTRAVENOUS
  Filled 2021-03-20: qty 100

## 2021-03-20 MED ORDER — SODIUM CHLORIDE FLUSH 0.9 % IV SOLN
INTRAVENOUS | Status: AC
  Filled 2021-03-20: qty 10

## 2021-03-20 MED ORDER — SODIUM CHLORIDE 0.9 % IV SOLN: INTRAVENOUS | Status: DC

## 2021-03-20 MED ORDER — VASOPRESSIN 20 UNIT/ML IV SOLN
INTRAVENOUS | Status: DC | PRN
Start: 2021-03-20 — End: 2021-03-20
  Administered 2021-03-20 (×2): 1 [IU] via INTRAVENOUS

## 2021-03-20 MED ORDER — EPHEDRINE SULFATE (PRESSORS) 50 MG/ML IJ SOLN
INTRAMUSCULAR | Status: DC | PRN
  Administered 2021-03-20: 10 mg via INTRAVENOUS
  Administered 2021-03-20: 5 mg via INTRAVENOUS

## 2021-03-20 MED ORDER — OXYCODONE HCL 5 MG PO TABS: 5.0000 mg | ORAL_TABLET | Freq: Once | ORAL | Status: DC | PRN

## 2021-03-20 MED ORDER — ETOMIDATE 2 MG/ML IV SOLN
INTRAVENOUS | Status: DC | PRN
  Administered 2021-03-20: 12 mg via INTRAVENOUS

## 2021-03-20 MED ORDER — ONDANSETRON HCL 4 MG/2ML IJ SOLN
INTRAMUSCULAR | Status: DC | PRN
  Administered 2021-03-20: 4 mg via INTRAVENOUS

## 2021-03-20 MED ORDER — CEFAZOLIN SODIUM-DEXTROSE 2-4 GM/100ML-% IV SOLN
INTRAVENOUS | Status: AC
  Filled 2021-03-20: qty 100

## 2021-03-20 MED ORDER — NEPRO/CARBSTEADY PO LIQD
237.0000 mL | Freq: Three times a day (TID) | ORAL | Status: DC
  Administered 2021-03-21 – 2021-03-23 (×7): 237 mL via ORAL
  Filled 2021-03-20 (×5): qty 237

## 2021-03-20 MED ORDER — FENTANYL CITRATE (PF) 100 MCG/2ML IJ SOLN: 25.0000 ug | INTRAMUSCULAR | Status: DC | PRN

## 2021-03-20 MED ORDER — 0.9 % SODIUM CHLORIDE (POUR BTL) OPTIME
TOPICAL | Status: DC | PRN
  Administered 2021-03-20: 500 mL

## 2021-03-20 MED ORDER — ACETAMINOPHEN 10 MG/ML IV SOLN: 1000.0000 mg | Freq: Once | INTRAVENOUS | Status: DC | PRN

## 2021-03-20 MED ORDER — LACTATED RINGERS IV SOLN: INTRAVENOUS | Status: DC

## 2021-03-20 MED ORDER — OXYCODONE HCL 5 MG/5ML PO SOLN: 5.0000 mg | Freq: Once | ORAL | Status: DC | PRN

## 2021-03-20 SURGICAL SUPPLY — 26 items
CHLORAPREP W/TINT 26 (MISCELLANEOUS) ×2 IMPLANT
DERMABOND ADVANCED (GAUZE/BANDAGES/DRESSINGS) ×1
DERMABOND ADVANCED .7 DNX12 (GAUZE/BANDAGES/DRESSINGS) ×1 IMPLANT
DRAPE LAPAROTOMY TRNSV 106X77 (MISCELLANEOUS) ×2 IMPLANT
ELECT REM PT RETURN 9FT ADLT (ELECTROSURGICAL) ×2 IMPLANT
ELECTRODE REM PT RTRN 9FT ADLT (ELECTROSURGICAL) ×1 IMPLANT
GAUZE 4X4 16PLY ~~LOC~~+RFID DBL (SPONGE) ×2 IMPLANT
GAUZE SPONGE 4X4 12PLY STRL (GAUZE/BANDAGES/DRESSINGS) ×2 IMPLANT
GLOVE SURG SYN 7.0 (GLOVE) ×2 IMPLANT
GLOVE SURG SYN 7.0 PF PI (GLOVE) ×1 IMPLANT
GOWN STRL REUS W/ TWL LRG LVL3 (GOWN DISPOSABLE) ×1 IMPLANT
GOWN STRL REUS W/ TWL XL LVL3 (GOWN DISPOSABLE) ×1 IMPLANT
GOWN STRL REUS W/TWL LRG LVL3 (GOWN DISPOSABLE) ×1
GOWN STRL REUS W/TWL XL LVL3 (GOWN DISPOSABLE) ×1
KIT TURNOVER KIT A (KITS) ×2 IMPLANT
LABEL OR SOLS (LABEL) ×2 IMPLANT
MANIFOLD NEPTUNE II (INSTRUMENTS) ×2 IMPLANT
NDL HYPO 25X1 1.5 SAFETY (NEEDLE) ×1 IMPLANT
NEEDLE HYPO 25X1 1.5 SAFETY (NEEDLE) ×2 IMPLANT
NS IRRIG 500ML POUR BTL (IV SOLUTION) ×2 IMPLANT
PACK BASIN MINOR ARMC (MISCELLANEOUS) ×2 IMPLANT
SUT MNCRL AB 4-0 PS2 18 (SUTURE) ×2 IMPLANT
SUT VIC AB 3-0 SH 27 (SUTURE) ×1
SUT VIC AB 3-0 SH 27X BRD (SUTURE) IMPLANT
SUT VICRYL+ 3-0 36IN CT-1 (SUTURE) ×2 IMPLANT
SYR 10ML LL (SYRINGE) ×2 IMPLANT

## 2021-03-20 NOTE — Progress Notes (Signed)
Pharmacy Antibiotic Note  Anthony Hess is a 66 y.o. male w/ PMH of CAD s/p CABG, HTN, A. fib, CHF, ESRD on PD, history of bladder cancer, hyperparathyroidism, anemia of chronic disease admitted on 03/14/2021 with SBP.  Pharmacy has been consulted for vancomycin and ceftazidime dosing.  PD patient with SBP requiring removal of PD catheter.  Right IJ dialysis catheter placed on 03/18/21 s/p 1st HD session 01/26 (terminated 1 hour into session) with next planned for today.  Vancomycin Level (03/20/21 0413): 27 mcg/mL  Plan:  1) given level no vancomycin dose warranted for today level with am labs 01/28 redose vancomycin when level is 15 - 25 mcg/mL  2) continue ceftazidime 1 gram IV every 24 hours timed after HD   Height: 5\' 3"  (160 cm) Weight: 59.1 kg (130 lb 4.7 oz) IBW/kg (Calculated) : 56.9  Temp (24hrs), Avg:98.5 F (36.9 C), Min:98.1 F (36.7 C), Max:98.9 F (37.2 C)  Recent Labs  Lab 03/14/21 1025 03/14/21 2112 03/14/21 2120 03/16/21 0400 03/17/21 0413 03/17/21 0413 03/18/21 0417 03/19/21 0418 03/20/21 0413  WBC 15.7* 16.4*   < > 12.4* 14.5*  --  11.7* 13.4* 10.6*  CREATININE 8.74*  --    < > 8.05* 8.15*  --  7.77* 8.71*   8.81* 8.54*   8.29*  LATICACIDVEN 2.0* 1.3  --   --   --   --   --   --   --   VANCORANDOM  --   --   --   --  28   < > 24  --  27   < > = values in this interval not displayed.     Estimated Creatinine Clearance: 7.1 mL/min (A) (by C-G formula based on SCr of 8.54 mg/dL (H)).    No Known Allergies  Antimicrobials this admission: 01/21 ceftazidime >>  01/21 vancomycin >>   Microbiology results: 01/21 BCx: NG final 01/21 WCx (peritoneal dialysate) : NG final 01/21 UCx: insignificant growth 01/21 SARS CoV-2: negative 01/21 influenza A/B: negative    Thank you for allowing pharmacy to be a part of this patients care.  Dallie Piles 03/20/2021 8:07 AM

## 2021-03-20 NOTE — Progress Notes (Signed)
Initial Nutrition Assessment  DOCUMENTATION CODES:   Not applicable  INTERVENTION:   Nepro Shake po TID, each supplement provides 425 kcal and 19 grams protein  Rena-vit po daily   NUTRITION DIAGNOSIS:   Increased nutrient needs related to chronic illness (ESRD on PD) as evidenced by estimated needs.  GOAL:   Patient will meet greater than or equal to 90% of their needs  MONITOR:   PO intake, Supplement acceptance, Labs, Weight trends, Skin, I & O's  REASON FOR ASSESSMENT:   Consult Assessment of nutrition requirement/status  ASSESSMENT:   66 y.o. male with past medical history of emphysema lung, CAD s/p CABG, HTN, A. fib, CHF, ESRD on PD, bladder cancer, hyperparathyroidism, anemia of chronic disease and AAA who is admitted with SBP and sepsis.  Unable to see pt today as pt in OR at time of RD visit. Per chart review, pt eating 50-100% of meals in hospital. Pt ordered for Ensure Max protein; RD will change pt to Nepro. Pt NPO today for planned removal of his PD cath. Per chart, pt appears weight stable at baseline. RD will obtain nutrition related history and exam at follow up.    Medications reviewed and include: aspirin, calcitriol, epoetin, heparin, Mg oxide, Kcl, vitamin D, albumin, ceftazidime  Labs reviewed: K 3.8 wnl, BUN 75(H), creat 8.54(H), P 6.2(H), alb <1.5(L) Cbgs- 10.6(H), Hgb 8.3(L), Hct 28.0(L)  NUTRITION - FOCUSED PHYSICAL EXAM: Unable to perform at this time   Diet Order:   Diet Order             Diet NPO time specified  Diet effective midnight                  EDUCATION NEEDS:   No education needs have been identified at this time  Skin:  Skin Assessment: Reviewed RN Assessment  Last BM:  1/25- type 2  Height:   Ht Readings from Last 1 Encounters:  03/20/21 5\' 3"  (1.6 m)    Weight:   Wt Readings from Last 1 Encounters:  03/20/21 63 kg    Ideal Body Weight:  56.36 kg  BMI:  Body mass index is 24.62 kg/m.  Estimated  Nutritional Needs:   Kcal:  1700-2000kcal/day  Protein:  85-100g/day  Fluid:  UOP +1L  Koleen Distance MS, RD, LDN Please refer to [facility] for RD and/or RD on-call/weekend/after hours pager

## 2021-03-20 NOTE — Anesthesia Procedure Notes (Signed)
Procedure Name: LMA Insertion Date/Time: 03/20/2021 3:10 PM Performed by: Hedda Slade, CRNA Pre-anesthesia Checklist: Patient identified, Patient being monitored, Timeout performed, Emergency Drugs available and Suction available Patient Re-evaluated:Patient Re-evaluated prior to induction Oxygen Delivery Method: Circle system utilized Preoxygenation: Pre-oxygenation with 100% oxygen Induction Type: IV induction Ventilation: Mask ventilation without difficulty LMA: LMA inserted LMA Size: 4.0 Tube type: Oral Number of attempts: 1 Placement Confirmation: positive ETCO2 and breath sounds checked- equal and bilateral Tube secured with: Tape Dental Injury: Teeth and Oropharynx as per pre-operative assessment

## 2021-03-20 NOTE — Progress Notes (Addendum)
[place], [place] 03/20/21  Subjective:   Hospital day # 6  Patient seen resting in bed, alert and oriented Currently n.p.o. awaiting PD catheter removal by surgery Denies shortness of breath Patient questioned about appetite prior to admission.  Patient states he has never been a big eater. Patient continues to complain of abdominal pain but states discomfort has improved since admission  Albumin <1.5   Objective:  Vital signs in last 24 hours:  Temp:  [98 F (36.7 C)-98.9 F (37.2 C)] 98 F (36.7 C) (01/27 0821) Pulse Rate:  [64-91] 64 (01/27 0821) Resp:  [18-20] 18 (01/27 0821) BP: (91-123)/(68-84) 123/78 (01/27 0821) SpO2:  [93 %-100 %] 99 % (01/27 0821) Weight:  [59.1 kg] 59.1 kg (01/27 0500)  Weight change: -1.2 kg Filed Weights   03/19/21 0844 03/19/21 1030 03/20/21 0500  Weight: 57.2 kg 57.2 kg 59.1 kg    Intake/Output:    Intake/Output Summary (Last 24 hours) at 03/20/2021 1231 Last data filed at 03/19/2021 1853 Gross per 24 hour  Intake 160 ml  Output --  Net 160 ml     Physical Exam: General:  No acute distress, laying in the bed  HEENT  anicteric, moist oral mucous membrane  Pulm/lungs  normal breathing effort, lungs are clear to auscultation  CVS/Heart  regular rhythm, no rub or gallop  Abdomen:   Soft, diffusely tender, PD cathter in place  Extremities:  No peripheral edema  Neurologic:  Alert, oriented, able to follow commands  Skin:  No acute rashes  Access Rt IJ Permcath placed on 03/18/21 by Dr Lucky Cowboy     Basic Metabolic Panel:  Recent Labs  Lab 03/15/21 0808 03/16/21 0400 03/17/21 0413 03/18/21 0417 03/19/21 0418 03/20/21 0413  NA 138 137 138 138 138   137 137   139  K 3.5 3.0* 3.5 3.2* 3.6   3.5 3.8   3.8  CL 96* 101 99 99 101   101 102   102  CO2 25 23 26 26 24   24 23   24   GLUCOSE 98 111* 122* 109* 115*   119* 94   100*  BUN 72* 71* 72* 68* 76*   75* 75*   73*  CREATININE 8.24* 8.05* 8.15* 7.77*  8.71*   8.81* 8.54*   8.29*  CALCIUM 6.4* 6.3* 6.7* 7.1* 7.1*   7.1* 6.8*   6.9*  MG 1.7 1.5* 1.7  --   --   --   PHOS 7.4* 6.8* 7.1* 6.7* 7.4* 6.2*      CBC: Recent Labs  Lab 03/14/21 2112 03/15/21 0808 03/16/21 0400 03/17/21 0413 03/18/21 0417 03/19/21 0418 03/20/21 0413  WBC 16.4*   < > 12.4* 14.5* 11.7* 13.4* 10.6*  NEUTROABS 13.9*  --   --   --   --   --   --   HGB 10.2*   < > 8.9* 8.9* 9.5* 8.7* 8.3*  HCT 32.3*   < > 28.8* 29.5* 31.3* 29.0* 28.0*  MCV 86.6   < > 85.2 86.5 88.7 89.0 89.7  PLT 590*   < > 493* 467* 481* 424* 405*   < > = values in this interval not displayed.       Lab Results  Component Value Date   HEPBSAG NON REACTIVE 03/18/2021   HEPBSAB NON REACTIVE 12/27/2019   HEPBIGM NON REACTIVE 12/27/2019      Microbiology:  Recent Results (from the past 240 hour(s))  Resp Panel by RT-PCR (  Flu A&B, Covid) Nasopharyngeal Swab     Status: None   Collection Time: 03/14/21  5:14 PM   Specimen: Nasopharyngeal Swab; Nasopharyngeal(NP) swabs in vial transport medium  Result Value Ref Range Status   SARS Coronavirus 2 by RT PCR NEGATIVE NEGATIVE Final    Comment: (NOTE) SARS-CoV-2 target nucleic acids are NOT DETECTED.  The SARS-CoV-2 RNA is generally detectable in upper respiratory specimens during the acute phase of infection. The lowest concentration of SARS-CoV-2 viral copies this assay can detect is 138 copies/mL. A negative result does not preclude SARS-Cov-2 infection and should not be used as the sole basis for treatment or other patient management decisions. A negative result may occur with  improper specimen collection/handling, submission of specimen other than nasopharyngeal swab, presence of viral mutation(s) within the areas targeted by this assay, and inadequate number of viral copies(<138 copies/mL). A negative result must be combined with clinical observations, patient history, and epidemiological information. The expected result is  Negative.  Fact Sheet for Patients:  EntrepreneurPulse.com.au  Fact Sheet for Healthcare Providers:  IncredibleEmployment.be  This test is no t yet approved or cleared by the [place] FDA and  has been authorized for detection and/or diagnosis of SARS-CoV-2 by FDA under an Emergency Use Authorization (EUA). This EUA will remain  in effect (meaning this test can be used) for the duration of the COVID-19 declaration under Section 564(b)(1) of the Act, 21 U.S.C.section 360bbb-3(b)(1), unless the authorization is terminated  or revoked sooner.       Influenza A by PCR NEGATIVE NEGATIVE Final   Influenza B by PCR NEGATIVE NEGATIVE Final    Comment: (NOTE) The Xpert Xpress SARS-CoV-2/FLU/RSV plus assay is intended as an aid in the diagnosis of influenza from Nasopharyngeal swab specimens and should not be used as a sole basis for treatment. Nasal washings and aspirates are unacceptable for Xpert Xpress SARS-CoV-2/FLU/RSV testing.  Fact Sheet for Patients: EntrepreneurPulse.com.au  Fact Sheet for Healthcare Providers: IncredibleEmployment.be  This test is not yet approved or cleared by the [place] FDA and has been authorized for detection and/or diagnosis of SARS-CoV-2 by FDA under an Emergency Use Authorization (EUA). This EUA will remain in effect (meaning this test can be used) for the duration of the COVID-19 declaration under Section 564(b)(1) of the Act, 21 U.S.C. section 360bbb-3(b)(1), unless the authorization is terminated or revoked.  Performed at [facility], [address] [place], [place] [address]   Blood Culture (routine x 2)     Status: None   Collection Time: 03/14/21  5:14 PM   Specimen: BLOOD  Result Value Ref Range Status   Specimen Description BLOOD RIGHT ANTECUBITAL  Final   Special Requests   Final    BOTTLES DRAWN AEROBIC AND ANAEROBIC Blood Culture adequate  volume   Culture   Final    NO GROWTH 5 DAYS Performed at [facility], [address], [place], [place] [address]    Report Status 03/19/2021 FINAL  Final  Urine Culture     Status: Abnormal   Collection Time: 03/14/21  5:20 PM   Specimen: Urine, Random  Result Value Ref Range Status   Specimen Description   Final    URINE, RANDOM Performed at [facility], [address] [place], [place] [address]    Special Requests   Final    NONE Performed at [facility], [address] [place], [place] [address]    Culture (A)  Final    <10,000  COLONIES/mL INSIGNIFICANT GROWTH Performed at [facility] [address] [place], [place] [address]    Report Status 03/17/2021 FINAL  Final  Body fluid culture w Gram Stain     Status: None   Collection Time: 03/14/21  7:19 PM   Specimen: Peritoneal Dialysate; Body Fluid  Result Value Ref Range Status   Specimen Description   Final    PERITONEAL DIALYSATE Performed at [facility], [address] [place], [place] [address]    Special Requests   Final    Normal Performed at [facility], [address] [place], [place] [address]    Gram Stain   Final    MODERATE WBC PRESENT,BOTH PMN AND MONONUCLEAR NO ORGANISMS SEEN    Culture   Final    NO GROWTH 3 DAYS Performed at [facility], [place] [address], [place], [place] [address]    Report Status 03/18/2021 FINAL  Final  Blood Culture (routine x 2)     Status: None   Collection Time: 03/14/21  9:12 PM   Specimen: BLOOD  Result Value Ref Range Status   Specimen Description BLOOD RIGHT ANTECUBITAL  Final   Special Requests   Final    BOTTLES DRAWN AEROBIC AND ANAEROBIC Blood Culture adequate volume   Culture   Final    NO GROWTH 5 DAYS Performed at [facility], [address] [place], [place] [address]    Report Status 03/19/2021 FINAL  Final    Coagulation Studies: No results for input(s): LABPROT, INR in  the last 72 hours.   Urinalysis: No results for input(s): COLORURINE, LABSPEC, PHURINE, GLUCOSEU, HGBUR, BILIRUBINUR, KETONESUR, PROTEINUR, UROBILINOGEN, NITRITE, LEUKOCYTESUR in the last 72 hours.  Invalid input(s): APPERANCEUR     Imaging: PERIPHERAL VASCULAR CATHETERIZATION  Result Date: 03/18/2021 See surgical note for result.    Medications:    albumin human     cefTAZidime (FORTAZ)  IV 1 g (03/19/21 1355)   dialysis solution 1.5% low-MG/low-CA     sodium chloride      aspirin EC  81 mg Oral Daily   atorvastatin  40 mg Oral Daily   calcitRIOL  0.5 mcg Oral Daily   calcium carbonate  1,250 mg Oral BID WC   Chlorhexidine Gluconate Cloth  6 each Topical Daily   epoetin (EPOGEN/PROCRIT) injection  10,000 Units Subcutaneous Weekly   gentamicin cream  1 application Topical Daily   heparin  5,000 Units Subcutaneous Q8H   magnesium oxide  400 mg Oral Daily   metoprolol succinate  50 mg Oral Daily   nystatin  5 mL Oral TID   potassium chloride  20 mEq Oral Daily   Ensure Max Protein  11 oz Oral BID   sucroferric oxyhydroxide  1,000 mg Oral TID   Vitamin D (Ergocalciferol)  50,000 Units Oral Q Sat   acetaminophen, benzonatate, bisacodyl, heparin, ondansetron **OR** ondansetron (ZOFRAN) IV, oxyCODONE  Assessment/ Plan:  66 y.o. male with   end stage renal disease on peritoneal dialysis, hypertension, hyperlipidemia, coronary artery disease status post CABG, history of bladder cancer, COPD with ongoing tobacco use, who is    admitted on 03/14/2021 for Spontaneous bacterial peritonitis ([place]) [K65.2] Generalized abdominal pain [R10.84] Gallbladder sludge [K82.8] Sepsis ([place]) [A41.9] Sepsis, due to unspecified organism, unspecified whether acute organ dysfunction present ([place]) [A41.9]  [facility]  #End-stage renal disease on peritoneal dialysis Appreciate vascular placing right IJ PermCath on 03/18/21.  Dialysis initiated on 03/19/21.  Treatment  terminated  after 1 hour due to dialyzer clotting.  Plan to heparinize dialyzer and lines during treatment today.  Plan to dialyze patient after PD catheter removal this afternoon.  If procedure later this afternoon may dialyze tomorrow.  Dialysis coordinator aware of patient and is currently working on outpatient center placement. Will order Albumin 25g with dialysis.   #Acute peritonitis Patient was admitted previously for the same problem and was discharged on January 16. Patient claims he was taking antibiotics at home as prescribed, but now admits to missing 2-3 antibiotic treatments.  Currently receiving daily ceftazidime and vancomycin based on pharmacy dosing.    #Anemia of chronic kidney disease Lab Results  Component Value Date   HGB 8.3 (L) 03/20/2021  Hemoglobin below target.  Will prescribe EPO 10,000 units IV with dialysis.  #Secondary hyperparathyroidism Lab Results  Component Value Date   PTH 159 (H) 12/27/2019   CALCIUM 6.8 (L) 03/20/2021   CALCIUM 6.9 (L) 03/20/2021   CAION 0.74 (LL) 01/20/2021   PHOS 6.2 (H) 03/20/2021  Calcium and phosphorus remains out above target. Continue calcium carbonate and calcitriol.    LOS: [place] 1/27/202312:31 PM  [place], [place]  Note: This note was prepared with Dragon dictation. Any transcription errors are unintentional

## 2021-03-20 NOTE — Op Note (Addendum)
[facility]   OPERATIVE NOTE  DATE: 03/20/2021  PRE-OPERATIVE DIAGNOSIS: ESRD, nonfunctional PD catheter  POST-OPERATIVE DIAGNOSIS: same as above  PROCEDURE: 1.   Removal of peritoneal dialysis catheter  SURGEON: [name], MD  ASSISTANT(S): none  ANESTHESIA: general  ESTIMATED BLOOD LOSS: 3 cc  FINDING(S): 1.  none  SPECIMEN(S):  PD catheter  INDICATIONS:   Patient is a 66 y.o.male with a nonfunctional PD catheter.  The peritoneal catheter needs to be removed.  Risks and benefits are discussed.  DESCRIPTION: After obtaining full informed written consent, the patient was brought back to the operating room and placed supine upon the operating table.  The patient received IV antibiotics prior to induction.  After obtaining adequate anesthesia, the patient was prepped and draped in the standard fashion. The previous incision beside the umbilicus were the deep cuff was located was reopened, and the cuff was dissected out at the fascia without difficulty.  The catheter was then removed from the peritoneal cavity and a 0 Vicryl pursestring suture was used to close this opening.  The superficial cuff was then dissected out and freed and the catheter was then transected and removed from the field.  The transverse periumbilical incision was then closed with a 3-0 Vicryl and a 4-0 Monocryl and dermabond was placed.  A dry dressing was placed at the previous catheter exit site.  The patient was then awakened from anesthesia and taken to the recovery room in stable condition.  COMPLICATIONS: None  CONDITION: Stable   [name] 03/20/2021 3:34 PM

## 2021-03-20 NOTE — Transfer of Care (Signed)
Immediate Anesthesia Transfer of Care Note  Patient: Anthony Hess  Procedure(s) Performed: REMOVAL OF A PD CATHETER (Left: Abdomen)  Patient Location: PACU  Anesthesia Type:General  Level of Consciousness: sedated  Airway & Oxygen Therapy: Patient Spontanous Breathing  Post-op Assessment: Report given to RN and Post -op Vital signs reviewed and stable  Post vital signs: Reviewed and stable  Last Vitals:  Vitals Value Taken Time  BP 155/95 03/20/21 1541  Temp    Pulse 62 03/20/21 1544  Resp 13 03/20/21 1544  SpO2 97 % 03/20/21 1544  Vitals shown include unvalidated device data.  Last Pain:  Vitals:   03/20/21 1346  TempSrc: Temporal  PainSc: 0-No pain      Patients Stated Pain Goal: 4 (46/96/29 5284)  Complications: No notable events documented.

## 2021-03-20 NOTE — Anesthesia Preprocedure Evaluation (Addendum)
Anesthesia Evaluation  Patient identified by MRN, date of birth, ID band Patient awake    Reviewed: Allergy & Precautions, NPO status , Patient's Chart, lab work & pertinent test results  History of Anesthesia Complications Negative for: history of anesthetic complications  Airway Mallampati: III   Neck ROM: Full    Dental  (+) Poor Dentition   Pulmonary COPD, former smoker (quit 2020),    Pulmonary exam normal breath sounds clear to auscultation       Cardiovascular hypertension, + CAD (s/p MI and CABG), + Peripheral Vascular Disease and +CHF (EF 20-25%)  Normal cardiovascular exam+ dysrhythmias (a fib this admission)  Rhythm:Regular Rate:Normal  AAA 3.2 cm  ECG 03/14/21: NSR, rightward axis  Myocardial perfusion 12/01/18:  - Blood pressure demonstrated a normal response to exercise. - No T wave inversion was noted during stress. - Defect 1: There is a medium defect of moderate severity present in the basal anterior, basal anteroseptal, basal anterolateral, mid anterior, mid anteroseptal, mid anterolateral, apical anterior and apex location. - Findings consistent with prior myocardial infarction with peri-infarct ischemia. - This is a high risk study. - The left ventricular ejection fraction is severely decreased (<30%).   Neuro/Psych negative neurological ROS     GI/Hepatic Bacterial peritonitis 2/2 infected PD catheter   Endo/Other  diabetes, Type 2  Renal/GU ESRF and DialysisRenal diseaseBladder CA     Musculoskeletal   Abdominal   Peds  Hematology  (+) Blood dyscrasia, anemia ,   Anesthesia Other Findings   Reproductive/Obstetrics                           Anesthesia Physical Anesthesia Plan  ASA: 4  Anesthesia Plan: General   Post-op Pain Management:    Induction: Intravenous  PONV Risk Score and Plan: 2 and Ondansetron, Dexamethasone and Treatment may vary due to age or  medical condition  Airway Management Planned: LMA  Additional Equipment:   Intra-op Plan:   Post-operative Plan: Extubation in OR  Informed Consent: I have reviewed the patients History and Physical, chart, labs and discussed the procedure including the risks, benefits and alternatives for the proposed anesthesia with the patient or authorized representative who has indicated his/her understanding and acceptance.     Dental advisory given  Plan Discussed with: CRNA  Anesthesia Plan Comments: (Patient consented for risks of anesthesia including but not limited to:  - adverse reactions to medications - damage to eyes, teeth, lips or other oral mucosa - nerve damage due to positioning  - sore throat or hoarseness - damage to heart, brain, nerves, lungs, other parts of body or loss of life  Informed patient about role of CRNA in peri- and intra-operative care.  Patient voiced understanding.)       Anesthesia Quick Evaluation

## 2021-03-20 NOTE — Progress Notes (Signed)
Patient accepted for outpatient hemodialysis at [facility] TTS 12:25. Per Clinical Coordinator, patient will not be able to start until 1/31. Patient is aware of start date and time. Please contact me with any dialysis placement concerns.

## 2021-03-20 NOTE — Progress Notes (Signed)
Progress Note   Patient: Anthony Hess RJJ:[id number] DOB: 27-Dec-1955 DOA: 03/14/2021     6 DOS: the patient was seen and examined on 03/20/2021   Brief hospital course: Halden Phegley is a 66 y.o. male with Past medical history of CAD s/p CABG, HTN, CHF, ESRD on PD, history of bladder cancer, hyperparathyroidism, anemia of chronic disease, recently discharged on 1/16, after management for SBP. Patient presented back in the ED secondary to worsening of abdominal pain. In the ED, BP soft 99/81, saturating well on room air. Labs procalcitonin 1.35, WBC 15.7. CT abdomen pelvis with possible enteritis, ascites, pneumoperitoneum due to PD catheter, edema, 3.2 cm AAA. Pt admitted for further management.    Assessment and Plan * Sepsis ([place])- (present on admission) POA with hypotension, leukocytosis, lactic acidosis.  Procalcitonin was elevated. Sepsis physiology improved. --Continue antibiotics per ID --Follow cultures --Monitor hemodynamics  Spontaneous bacterial peritonitis ([place])- (present on admission) Continue ceftazidine and vancomycin. PD catheter to be removed by VS today.   Follow cultures.  ESRD (end stage renal disease) ([place])- (present on admission) Mgmt per nephrology. PD patient with SBP requiring removal of PD catheter - scheduled for today 1/27.   Right IJ dialysis catheter placed on 03/18/21.   Dialysis per nephrology. Outpatient dialysis will be at [facility] TTS 12:25, to start 1/31 if pt out of hospital at that time.  Anemia in ESRD (end-stage renal disease) ([place])- (present on admission) Hbg stable, slightly low from baseline. On Epo weekly per nephrology. --Monitor CBC  Atrial fibrillation ([place])- (present on admission) HR controlled.   Continue metoprolol. Telemetry.  Hyperlipidemia- (present on admission) Continue statin  S/P CABG x 4 Stable, no chest pain.  Continue ASA, statin  Hypertension- (present on admission) Continue metoprolol. Hold hydralazine,  irbesartan with soft BP's, resume when indicated.  AAA (abdominal aortic aneurysm)- (present on admission) Per CT abd/pelvis on 03/14/2021: Abdominal aortic aneurysm measuring 3.2 cm in maximal diameter again noted and unchanged. --Recommended U/S every 3 years   History of bladder cancer S/p transurethral resection     Subjective: Patient awake resting in bed when seen today.  He denies fevers chills.  Still has some abdominal discomfort.  Overall says he is feeling a little better, continues to feel weak and tired.  Acknowledges he needs to eat more protein in his diet.  Currently n.p.o. for peritoneal dialysis catheter to be removed this afternoon.  Objective Vitals reviewed and notable for no fevers, heart rate controlled, blood pressures stable with last soft BP of 91/68 around 9 PM last night.  General exam: awake, alert, no acute distress HEENT: moist mucus membranes, hearing grossly normal  Respiratory system: CTAB with diminished bases, no wheezes, rales or rhonchi, normal respiratory effort. Cardiovascular system: normal S1/S2, RRR, no pedal edema, right IJ hemodialysis catheter in place.   Gastrointestinal system: Distended, mildly tender on palpation, peritoneal dialysis catheter in place. Central nervous system: A&O x3. no gross focal neurologic deficits, normal speech Extremities: moves all, no edema, normal tone Skin: dry, intact, normal temperature Psychiatry: normal mood, congruent affect, judgement and insight appear normal   Data Reviewed:  Labs reviewed and notable for glucose 100, BUN 73, creatinine 8.29, calcium 6.9, phosphorus 6.2, albumin less than 1.5, white count improved to 10.6, hemoglobin stable 8.3, platelets 405  Family Communication: None at bedside.  Wife updated by phone on 1/26.  Disposition: Status is: Inpatient  Remains inpatient appropriate because: Severity of illness on broad-spectrum IV antibiotics pending removal of peritoneal dialysis  catheter this afternoon         Time spent: 35 minutes  Author: Ezekiel Slocumb, DO 03/20/2021 2:24 PM  For on call review [email or web address].

## 2021-03-20 NOTE — Interval H&P Note (Signed)
History and Physical Interval Note:  03/20/2021 1:57 PM  Anthony Hess  has presented today for surgery, with the diagnosis of Non functioning PD catheter, ESRD.  The various methods of treatment have been discussed with the patient and family. After consideration of risks, benefits and other options for treatment, the patient has consented to  Procedure(s): REMOVAL OF A PD CATHETER (N/A) as a surgical intervention.  The patient's history has been reviewed, patient examined, no change in status, stable for surgery.  I have reviewed the patient's chart and labs.  Questions were answered to the patient's satisfaction.     [name]

## 2021-03-20 NOTE — Care Management Important Message (Signed)
Important Message  Patient Details  Name: Anthony Hess MRN: [id number] Date of Birth: 01-Apr-1955   Medicare Important Message Given:  Yes     Dannette Barbara 03/20/2021, 11:01 AM

## 2021-03-21 LAB — CBC
HCT: 27.4 % — ABNORMAL LOW (ref 39.0–52.0)
Hemoglobin: 8.2 g/dL — ABNORMAL LOW (ref 13.0–17.0)
MCH: 26.9 pg (ref 26.0–34.0)
MCHC: 29.9 g/dL — ABNORMAL LOW (ref 30.0–36.0)
MCV: 89.8 fL (ref 80.0–100.0)
Platelets: 450 10*3/uL — ABNORMAL HIGH (ref 150–400)
RBC: 3.05 MIL/uL — ABNORMAL LOW (ref 4.22–5.81)
RDW: 15.7 % — ABNORMAL HIGH (ref 11.5–15.5)
WBC: 11 10*3/uL — ABNORMAL HIGH (ref 4.0–10.5)
nRBC: 0 % (ref 0.0–0.2)

## 2021-03-21 LAB — RENAL FUNCTION PANEL
Albumin: <1.5 g/dL — ABNORMAL LOW (ref 3.5–5.0)
Anion gap: 15 (ref 5–15)
BUN: 80 mg/dL — ABNORMAL HIGH (ref 8–23)
CO2: 23 mmol/L (ref 22–32)
Calcium: 7.6 mg/dL — ABNORMAL LOW (ref 8.9–10.3)
Chloride: 103 mmol/L (ref 98–111)
Creatinine, Ser: 9.54 mg/dL — ABNORMAL HIGH (ref 0.61–1.24)
GFR, Estimated: 6 mL/min — ABNORMAL LOW (ref >60)
Glucose, Bld: 137 mg/dL — ABNORMAL HIGH (ref 70–99)
Phosphorus: 7.5 mg/dL — ABNORMAL HIGH (ref 2.5–4.6)
Potassium: 4.1 mmol/L (ref 3.5–5.1)
Sodium: 141 mmol/L (ref 135–145)

## 2021-03-21 LAB — VANCOMYCIN, RANDOM: Vancomycin Rm: 24

## 2021-03-21 MED ORDER — VANCOMYCIN HCL 500 MG/100ML IV SOLN
500.0000 mg | INTRAVENOUS | Status: DC
  Filled 2021-03-21: qty 100

## 2021-03-21 NOTE — Progress Notes (Signed)
Pharmacy Antibiotic Note  Anthony Hess is a 66 y.o. male w/ PMH of CAD s/p CABG, HTN, A. fib, CHF, ESRD on PD, history of bladder cancer, hyperparathyroidism, anemia of chronic disease admitted on 03/14/2021 with SBP.  Pharmacy has been consulted for vancomycin and ceftazidime dosing.  PD patient with SBP requiring removal of PD catheter.  Right IJ dialysis catheter placed on 03/18/21 s/p 1st HD session 01/26 (terminated 1 hour into session) with next planned for today.  Vancomycin Level (03/21/21 0413): 24 mcg/mL  Plan:  1) Will give vancomycin 500 mg x 1 today since vanc level < 25. Ordered with HD. Redose vancomycin when level is 15 - 25 mcg/mL  2) continue ceftazidime 1 gram IV every 24 hours timed after HD   Height: 5\' 3"  (160 cm) Weight: 58.8 kg (129 lb 10.1 oz) IBW/kg (Calculated) : 56.9  Temp (24hrs), Avg:97.7 F (36.5 C), Min:97.3 F (36.3 C), Max:98.3 F (36.8 C)  Recent Labs  Lab 03/14/21 2112 03/14/21 2120 03/17/21 0413 03/18/21 0417 03/19/21 0418 03/20/21 0413 03/21/21 0526  WBC 16.4*   < > 14.5* 11.7* 13.4* 10.6* 11.0*  CREATININE  --    < > 8.15* 7.77* 8.71*   8.81* 8.54*   8.29* 9.54*  LATICACIDVEN 1.3  --   --   --   --   --   --   VANCORANDOM  --    < > 28 24  --  27 24   < > = values in this interval not displayed.     Estimated Creatinine Clearance: 6.4 mL/min (A) (by C-G formula based on SCr of 9.54 mg/dL (H)).    No Known Allergies  Antimicrobials this admission: 01/21 ceftazidime >>  01/21 vancomycin >>   Microbiology results: 01/21 BCx: NG final 01/21 WCx (peritoneal dialysate) : NG final 01/21 UCx: insignificant growth 01/21 SARS CoV-2: negative 01/21 influenza A/B: negative    Thank you for allowing pharmacy to be a part of this patients care.  Oswald Hillock, PharmD, BCPS 03/21/2021 11:04 AM

## 2021-03-21 NOTE — Progress Notes (Signed)
Progress Note   Patient: Anthony Hess ZOX:[id number] DOB: Jan 12, 1956 DOA: 03/14/2021     7 DOS: the patient was seen and examined on 03/21/2021   Brief hospital course: [name] is a 66 y.o. male with Past medical history of CAD s/p CABG, HTN, CHF, ESRD on PD, history of bladder cancer, hyperparathyroidism, anemia of chronic disease, recently discharged on 1/16, after management for SBP. Patient presented back in the ED secondary to worsening of abdominal pain. In the ED, BP soft 99/81, saturating well on room air. Labs procalcitonin 1.35, WBC 15.7. CT abdomen pelvis with possible enteritis, ascites, pneumoperitoneum due to PD catheter, edema, 3.2 cm AAA. Pt admitted for further management.    1/28: PD catheter removed yesterday.  Outpatient dialysis arranged, can start 1/31.  Awaiting culture data and final antibiotic recommendations prior to discharge.  Assessment and Plan * Sepsis ([place])- (present on admission) POA with hypotension, leukocytosis, lactic acidosis.  Procalcitonin was elevated. Sepsis physiology improved. Blood cultures from admission negative --Continue antibiotics per ID --Follow peritoneal dialysis await culture -negative to date --Monitor hemodynamics  Spontaneous bacterial peritonitis ([place])- (present on admission) Continue ceftazidine and vancomycin. PD catheter removed by vascular surgery on 1/27. Follow cultures.  ESRD (end stage renal disease) ([place])- (present on admission) Mgmt per nephrology. PD patient with SBP requiring removal of PD catheter -done on 1/27.   Right IJ dialysis catheter placed on 03/18/21.   Dialysis per nephrology. Outpatient dialysis will be at [facility] TTS 12:25, to start 1/31 if pt out of hospital at that time.  Anemia in ESRD (end-stage renal disease) ([place])- (present on admission) Hbg stable, slightly low from baseline. On Epo weekly per nephrology. --Monitor CBC  Atrial fibrillation ([place])- (present on admission) HR  controlled.   Continue metoprolol. Telemetry.  Hyperlipidemia- (present on admission) Continue statin  S/P CABG x 4 Stable, no chest pain.  Continue ASA, statin  Hypertension- (present on admission) Continue metoprolol. Hold hydralazine, irbesartan with soft BP's, resume when indicated.  AAA (abdominal aortic aneurysm)- (present on admission) Per CT abd/pelvis on 03/14/2021: Abdominal aortic aneurysm measuring 3.2 cm in maximal diameter again noted and unchanged. --Recommended U/S every 3 years   History of bladder cancer S/p transurethral resection     Subjective: Patient seen in dialysis today.  Tolerating well.  Patient overall reports feeling fair, slowly improving.  No fevers or chills.  Some abdominal discomfort.  No other acute complaints.  Objective Vitals reviewed and notable for labile blood pressures 145/99, during dialysis dropped to 87/56, this afternoon 137/98.  No fevers.  O2 sat stable on room air.  Heart rates controlled  General exam: awake, alert, no acute distress, frail, chronically ill-appearing HEENT: moist mucus membranes, hearing grossly normal  Respiratory system: CTAB, no wheezes, rales or rhonchi, normal respiratory effort. Cardiovascular system: normal S1/S2, RRR, right IJ tunneled dialysis catheter in place accessed with HD underway.   Gastrointestinal system: Distended, mildly tender, PD catheter removed. Central nervous system: A&O x3. no gross focal neurologic deficits, normal speech Extremities: moves all, no edema, normal tone Skin: dry, intact, normal temperature Psychiatry: normal mood, congruent affect, judgement and insight appear normal   Data Reviewed:  Labs reviewed and notable for glucose 137, BUN 80, creatinine 9.54, calcium 7.6, phosphorus 7.5, albumin less than 1.5, WBC 11.0, hemoglobin stable 8.2, platelets 450  Family Communication: None at bedside.  Will attempt to call wife this afternoon  Disposition: Status is:  Inpatient  Remains inpatient appropriate because: Severity of illness on IV  antibiotics with cultures pending.  Anticipate discharge in the next 24 to 48 hours once final antibiotic recommendations are made.         Time spent: 35 minutes  Author: Ezekiel Slocumb, DO 03/21/2021 3:16 PM  For on call review [email or web address].

## 2021-03-21 NOTE — Progress Notes (Signed)
[place], [place] 03/21/21  Subjective:   Hospital day # 7  Patient was seen today while on dialysis Patient's main complaint in today visit was that he misses his peritoneal dialysis catheter Patient offers no new specific physical complaints   Objective:  Vital signs in last 24 hours:  Temp:  [97.3 F (36.3 C)-98.3 F (36.8 C)] 98.3 F (36.8 C) (01/28 0812) Pulse Rate:  [59-82] 77 (01/28 0812) Resp:  [13-18] 18 (01/28 0812) BP: (105-155)/(80-99) 145/99 (01/28 0812) SpO2:  [91 %-98 %] 95 % (01/28 0812) Weight:  [63 kg] 63 kg (01/27 1346)  Weight change: 5.85 kg Filed Weights   03/19/21 1030 03/20/21 0500 03/20/21 1346  Weight: 57.2 kg 59.1 kg 63 kg    Intake/Output:    Intake/Output Summary (Last 24 hours) at 03/21/2021 0836 Last data filed at 03/20/2021 1900 Gross per 24 hour  Intake 440 ml  Output 2 ml  Net 438 ml    Physical Exam: General:  No acute distress, laying in the bed  HEENT  anicteric, moist oral mucous membrane  Pulm/lungs  normal breathing effort, lungs are clear to auscultation  CVS/Heart  regular rhythm, no rub or gallop  Abdomen:   Soft, minimal  tenderness, Well-healed surgical scar  Extremities:  No peripheral edema  Neurologic:  Alert, oriented, able to follow commands  Skin:  No acute rashes  Access Rt IJ Permcath placed on 03/18/21 by Dr Lucky Cowboy     Basic Metabolic Panel:  Recent Labs  Lab 03/15/21 0808 03/16/21 0400 03/17/21 0413 03/18/21 0417 03/19/21 0418 03/20/21 0413 03/21/21 0526  NA 138 137 138 138 138   137 137   139 141  K 3.5 3.0* 3.5 3.2* 3.6   3.5 3.8   3.8 4.1  CL 96* 101 99 99 101   101 102   102 103  CO2 25 23 26 26 24   24 23   24 23   GLUCOSE 98 111* 122* 109* 115*   119* 94   100* 137*  BUN 72* 71* 72* 68* 76*   75* 75*   73* 80*  CREATININE 8.24* 8.05* 8.15* 7.77* 8.71*   8.81* 8.54*   8.29* 9.54*  CALCIUM 6.4* 6.3* 6.7* 7.1* 7.1*   7.1* 6.8*   6.9* 7.6*  MG 1.7 1.5* 1.7  --   --    --   --   PHOS 7.4* 6.8* 7.1* 6.7* 7.4* 6.2* 7.5*     CBC: Recent Labs  Lab 03/14/21 2112 03/15/21 0808 03/17/21 0413 03/18/21 0417 03/19/21 0418 03/20/21 0413 03/21/21 0526  WBC 16.4*   < > 14.5* 11.7* 13.4* 10.6* 11.0*  NEUTROABS 13.9*  --   --   --   --   --   --   HGB 10.2*   < > 8.9* 9.5* 8.7* 8.3* 8.2*  HCT 32.3*   < > 29.5* 31.3* 29.0* 28.0* 27.4*  MCV 86.6   < > 86.5 88.7 89.0 89.7 89.8  PLT 590*   < > 467* 481* 424* 405* 450*   < > = values in this interval not displayed.      Lab Results  Component Value Date   HEPBSAG NON REACTIVE 03/18/2021   HEPBSAB NON REACTIVE 12/27/2019   HEPBIGM NON REACTIVE 12/27/2019      Microbiology:  Recent Results (from the past 240 hour(s))  Resp Panel by RT-PCR (Flu A&B, Covid) Nasopharyngeal Swab     Status: None   Collection  Time: 03/14/21  5:14 PM   Specimen: Nasopharyngeal Swab; Nasopharyngeal(NP) swabs in vial transport medium  Result Value Ref Range Status   SARS Coronavirus 2 by RT PCR NEGATIVE NEGATIVE Final    Comment: (NOTE) SARS-CoV-2 target nucleic acids are NOT DETECTED.  The SARS-CoV-2 RNA is generally detectable in upper respiratory specimens during the acute phase of infection. The lowest concentration of SARS-CoV-2 viral copies this assay can detect is 138 copies/mL. A negative result does not preclude SARS-Cov-2 infection and should not be used as the sole basis for treatment or other patient management decisions. A negative result may occur with  improper specimen collection/handling, submission of specimen other than nasopharyngeal swab, presence of viral mutation(s) within the areas targeted by this assay, and inadequate number of viral copies(<138 copies/mL). A negative result must be combined with clinical observations, patient history, and epidemiological information. The expected result is Negative.  Fact Sheet for Patients:  EntrepreneurPulse.com.au  Fact Sheet for  Healthcare Providers:  IncredibleEmployment.be  This test is no t yet approved or cleared by the [place] FDA and  has been authorized for detection and/or diagnosis of SARS-CoV-2 by FDA under an Emergency Use Authorization (EUA). This EUA will remain  in effect (meaning this test can be used) for the duration of the COVID-19 declaration under Section 564(b)(1) of the Act, 21 U.S.C.section 360bbb-3(b)(1), unless the authorization is terminated  or revoked sooner.       Influenza A by PCR NEGATIVE NEGATIVE Final   Influenza B by PCR NEGATIVE NEGATIVE Final    Comment: (NOTE) The Xpert Xpress SARS-CoV-2/FLU/RSV plus assay is intended as an aid in the diagnosis of influenza from Nasopharyngeal swab specimens and should not be used as a sole basis for treatment. Nasal washings and aspirates are unacceptable for Xpert Xpress SARS-CoV-2/FLU/RSV testing.  Fact Sheet for Patients: EntrepreneurPulse.com.au  Fact Sheet for Healthcare Providers: IncredibleEmployment.be  This test is not yet approved or cleared by the [place] FDA and has been authorized for detection and/or diagnosis of SARS-CoV-2 by FDA under an Emergency Use Authorization (EUA). This EUA will remain in effect (meaning this test can be used) for the duration of the COVID-19 declaration under Section 564(b)(1) of the Act, 21 U.S.C. section 360bbb-3(b)(1), unless the authorization is terminated or revoked.  Performed at [facility], [address] [place], [place] [address]   Blood Culture (routine x 2)     Status: None   Collection Time: 03/14/21  5:14 PM   Specimen: BLOOD  Result Value Ref Range Status   Specimen Description BLOOD RIGHT ANTECUBITAL  Final   Special Requests   Final    BOTTLES DRAWN AEROBIC AND ANAEROBIC Blood Culture adequate volume   Culture   Final    NO GROWTH 5 DAYS Performed at [facility], [address] [place], [place] [address]    Report Status 03/19/2021 FINAL  Final  Urine Culture     Status: Abnormal   Collection Time: 03/14/21  5:20 PM   Specimen: Urine, Random  Result Value Ref Range Status   Specimen Description   Final    URINE, RANDOM Performed at [facility], [address] [place], [place] [address]    Special Requests   Final    NONE Performed at [facility], [address], [place],  [address]    Culture (A)  Final    <10,000 COLONIES/mL INSIGNIFICANT GROWTH Performed at [facility], [place],  [place] [address]    Report Status 03/17/2021 FINAL  Final  Body fluid culture w Gram Stain     Status: None   Collection Time: 03/14/21  7:19 PM   Specimen: Peritoneal Dialysate; Body Fluid  Result Value Ref Range Status   Specimen Description   Final    PERITONEAL DIALYSATE Performed at [facility], [address] [place], [place] [address]    Special Requests   Final    Normal Performed at [facility], [address] [place], [place] [address]    Gram Stain   Final    MODERATE WBC PRESENT,BOTH PMN AND MONONUCLEAR NO ORGANISMS SEEN    Culture   Final    NO GROWTH 3 DAYS Performed at [facility], [place] [address], [place], [place] [address]    Report Status 03/18/2021 FINAL  Final  Blood Culture (routine x 2)     Status: None   Collection Time: 03/14/21  9:12 PM   Specimen: BLOOD  Result Value Ref Range Status   Specimen Description BLOOD RIGHT ANTECUBITAL  Final   Special Requests   Final    BOTTLES DRAWN AEROBIC AND ANAEROBIC Blood Culture adequate volume   Culture   Final    NO GROWTH 5 DAYS Performed at [facility], [address] [place], [place] [address]    Report Status 03/19/2021 FINAL  Final    Coagulation Studies: No results for input(s): LABPROT, INR in the last 72 hours.   Urinalysis: No results for input(s): COLORURINE, LABSPEC, PHURINE,  GLUCOSEU, HGBUR, BILIRUBINUR, KETONESUR, PROTEINUR, UROBILINOGEN, NITRITE, LEUKOCYTESUR in the last 72 hours.  Invalid input(s): APPERANCEUR     Imaging: No results found.   Medications:    albumin human     cefTAZidime (FORTAZ)  IV 1 g (03/20/21 1744)   dialysis solution 1.5% low-MG/low-CA     sodium chloride      aspirin EC  81 mg Oral Daily   atorvastatin  40 mg Oral Daily   calcitRIOL  0.5 mcg Oral Daily   calcium carbonate  1,250 mg Oral BID WC   Chlorhexidine Gluconate Cloth  6 each Topical Daily   epoetin (EPOGEN/PROCRIT) injection  10,000 Units Subcutaneous Weekly   feeding supplement (NEPRO CARB STEADY)  237 mL Oral TID BM   gentamicin cream  1 application Topical Daily   heparin  5,000 Units Subcutaneous Q8H   magnesium oxide  400 mg Oral Daily   metoprolol succinate  50 mg Oral Daily   multivitamin  1 tablet Oral QHS   nystatin  5 mL Oral TID   potassium chloride  20 mEq Oral Daily   sucroferric oxyhydroxide  1,000 mg Oral TID   Vitamin D (Ergocalciferol)  50,000 Units Oral Q Sat   acetaminophen, benzonatate, bisacodyl, heparin, ondansetron **OR** ondansetron (ZOFRAN) IV, oxyCODONE  Assessment/ Plan:  66 y.o. male with   end stage renal disease on peritoneal dialysis, hypertension, hyperlipidemia, coronary artery disease status post CABG, history of bladder cancer, COPD with ongoing tobacco use, who is    admitted on 03/14/2021 for Spontaneous bacterial peritonitis ([place]) [K65.2] Generalized abdominal pain [R10.84] Gallbladder sludge [K82.8] Sepsis ([place]) [A41.9] Sepsis, due to unspecified organism, unspecified whether acute organ dysfunction present ([place]) [A41.9]  [facility] [place]  #End-stage renal disease now on hemodialysis Patient was on on peritoneal dialysis Appreciate vascular placing right IJ PermCath on 03/18/21.   Patient now transition to hemodialysis Patient's dialysis was initiated on 03/19/21.  Patient is being dialyzed today To help  with discharge planning dialysis coordinator is aware of patient and is y working on outpatient center placement.    #Acute peritonitis Patient was admitted previously for the same problem and was discharged on January 16. Patient claims he was taking antibiotics at home as prescribed, but now admits to missing 2-3 antibiotic treatments. Patient is currently receiving daily ceftazidime and vancomycin based on pharmacy dosing.    #Anemia of chronic kidney disease Lab Results  Component Value Date   HGB 8.2 (L) 03/21/2021  Hemoglobin is below target. On  EPO 10,000 units IV with dialysis.  #Secondary hyperparathyroidism Lab Results  Component Value Date   PTH 159 (H) 12/27/2019   CALCIUM 7.6 (L) 03/21/2021   CAION 0.74 (LL) 01/20/2021   PHOS 7.5 (H) 03/21/2021  Calcium and phosphorus remains out above target. Continue calcium carbonate and calcitriol.    LOS: 7 Khamil Lamica s [facility] 1/28/20238:36 [place], [place]  Note: This note was prepared with Dragon dictation. Any transcription errors are unintentional

## 2021-03-22 DIAGNOSIS — I4891 Unspecified atrial fibrillation: Secondary | ICD-10-CM | POA: Diagnosis not present

## 2021-03-22 DIAGNOSIS — I10 Essential (primary) hypertension: Secondary | ICD-10-CM | POA: Diagnosis not present

## 2021-03-22 DIAGNOSIS — A419 Sepsis, unspecified organism: Secondary | ICD-10-CM | POA: Diagnosis not present

## 2021-03-22 DIAGNOSIS — N186 End stage renal disease: Secondary | ICD-10-CM | POA: Diagnosis not present

## 2021-03-22 LAB — CBC
HCT: 27.4 % — ABNORMAL LOW (ref 39.0–52.0)
Hemoglobin: 8.2 g/dL — ABNORMAL LOW (ref 13.0–17.0)
MCH: 26.8 pg (ref 26.0–34.0)
MCHC: 29.9 g/dL — ABNORMAL LOW (ref 30.0–36.0)
MCV: 89.5 fL (ref 80.0–100.0)
Platelets: 443 10*3/uL — ABNORMAL HIGH (ref 150–400)
RBC: 3.06 MIL/uL — ABNORMAL LOW (ref 4.22–5.81)
RDW: 15.7 % — ABNORMAL HIGH (ref 11.5–15.5)
WBC: 9.4 10*3/uL (ref 4.0–10.5)
nRBC: 0 % (ref 0.0–0.2)

## 2021-03-22 LAB — RENAL FUNCTION PANEL
Albumin: 1.6 g/dL — ABNORMAL LOW (ref 3.5–5.0)
Anion gap: 10 (ref 5–15)
BUN: 62 mg/dL — ABNORMAL HIGH (ref 8–23)
CO2: 27 mmol/L (ref 22–32)
Calcium: 7.4 mg/dL — ABNORMAL LOW (ref 8.9–10.3)
Chloride: 103 mmol/L (ref 98–111)
Creatinine, Ser: 7.24 mg/dL — ABNORMAL HIGH (ref 0.61–1.24)
GFR, Estimated: 8 mL/min — ABNORMAL LOW (ref >60)
Glucose, Bld: 101 mg/dL — ABNORMAL HIGH (ref 70–99)
Phosphorus: 5 mg/dL — ABNORMAL HIGH (ref 2.5–4.6)
Potassium: 4.2 mmol/L (ref 3.5–5.1)
Sodium: 140 mmol/L (ref 135–145)

## 2021-03-22 MED ORDER — VANCOMYCIN HCL 500 MG/100ML IV SOLN
500.0000 mg | Freq: Once | INTRAVENOUS | Status: AC
  Administered 2021-03-22: 500 mg via INTRAVENOUS
  Filled 2021-03-22: qty 100

## 2021-03-22 MED ORDER — VANCOMYCIN HCL 500 MG/100ML IV SOLN
500.0000 mg | INTRAVENOUS | Status: DC
  Filled 2021-03-22: qty 100

## 2021-03-22 MED ORDER — VANCOMYCIN HCL 500 MG/100ML IV SOLN: 500.0000 mg | INTRAVENOUS | Status: DC

## 2021-03-22 NOTE — Progress Notes (Signed)
2 Days Post-Op   Subjective/Chief Complaint: Doing OK. States abdomen is sore at surgical site/PD removal. Tolerating HD via Tunneled catheter   Objective: Vital signs in last 24 hours: Temp:  [97.8 F (36.6 C)-98.5 F (36.9 C)] 97.8 F (36.6 C) (01/29 0801) Pulse Rate:  [71-81] 74 (01/29 0801) Resp:  [14-20] 16 (01/29 0801) BP: (87-145)/(56-99) 145/99 (01/29 0801) SpO2:  [89 %-97 %] 93 % (01/29 0801) Weight:  [58.8 kg] 58.8 kg (01/28 0918) Last BM Date: 03/20/21  Intake/Output from previous day: 01/28 0701 - 01/29 0700 In: 440 [P.O.:240; IV Piggyback:200] Out: 502  Intake/Output this shift: No intake/output data recorded.  General appearance: alert and no distress GI: soft, distended, incisions C/D/I, PD site dry, no erythema  Lab Results:  Recent Labs    03/21/21 0526 03/22/21 0353  WBC 11.0* 9.4  HGB 8.2* 8.2*  HCT 27.4* 27.4*  PLT 450* 443*   BMET Recent Labs    03/21/21 0526 03/22/21 0353  NA 141 140  K 4.1 4.2  CL 103 103  CO2 23 27  GLUCOSE 137* 101*  BUN 80* 62*  CREATININE 9.54* 7.24*  CALCIUM 7.6* 7.4*   PT/INR No results for input(s): LABPROT, INR in the last 72 hours. ABG No results for input(s): PHART, HCO3 in the last 72 hours.  Invalid input(s): PCO2, PO2  Studies/Results: No results found.  Anti-infectives: Anti-infectives (From admission, onward)    Start     Dose/Rate Route Frequency Ordered Stop   03/21/21 1200  vancomycin (VANCOREADY) IVPB 500 mg/100 mL        500 mg 100 mL/hr over 60 Minutes Intravenous Every T-Th-Sa (Hemodialysis) 03/21/21 1102 03/24/21 1159   03/20/21 1351  ceFAZolin (ANCEF) 2-4 GM/100ML-% IVPB       Note to Pharmacy: [place] Calix, Cryst: cabinet override      03/20/21 1351 03/20/21 1520   03/20/21 1350  ceFAZolin (ANCEF) IVPB 2g/100 mL premix        2 g 200 mL/hr over 30 Minutes Intravenous 30 min pre-op 03/20/21 1350 03/20/21 1540   03/19/21 2000  vancomycin (VANCOREADY) IVPB 500 mg/100 mL         500 mg 100 mL/hr over 60 Minutes Intravenous  Once 03/19/21 1215 03/19/21 2213   03/18/21 1352  ceFAZolin (ANCEF) 1-4 GM/50ML-% IVPB       Note to Pharmacy: Lysbeth Penner : cabinet override      03/18/21 1352 03/19/21 0159   03/18/21 1310  ceFAZolin (ANCEF) IVPB 1 g/50 mL premix  Status:  Discontinued        1 g 100 mL/hr over 30 Minutes Intravenous 30 min pre-op 03/18/21 1310 03/18/21 1620   03/16/21 1200  cefTAZidime (FORTAZ) 1 g in sodium chloride 0.9 % 100 mL IVPB        1 g 200 mL/hr over 30 Minutes Intravenous Every 24 hours 03/16/21 1015     03/14/21 2200  nystatin (MYCOSTATIN) tablet 500,000 Units  Status:  Discontinued        1 tablet Oral 3 times daily 03/14/21 2110 03/15/21 1810   03/14/21 1800  cefTAZidime (FORTAZ) 1 g in sodium chloride 0.9 % 100 mL IVPB        1 g 200 mL/hr over 30 Minutes Intravenous  Once 03/14/21 1716 03/14/21 2323   03/14/21 1800  vancomycin (VANCOREADY) IVPB 1250 mg/250 mL        1,250 mg 166.7 mL/hr over 90 Minutes Intravenous  Once 03/14/21 1716 03/15/21 0046  03/14/21 1730  aztreonam (AZACTAM) 2 g in sodium chloride 0.9 % 100 mL IVPB  Status:  Discontinued        2 g 200 mL/hr over 30 Minutes Intravenous  Once 03/14/21 1721 03/14/21 1805   03/14/21 1730  metroNIDAZOLE (FLAGYL) IVPB 500 mg  Status:  Discontinued        500 mg 100 mL/hr over 60 Minutes Intravenous  Once 03/14/21 1721 03/14/21 1722   03/14/21 1730  vancomycin (VANCOCIN) IVPB 1000 mg/200 mL premix  Status:  Discontinued        1,000 mg 200 mL/hr over 60 Minutes Intravenous  Once 03/14/21 1721 03/14/21 1722       Assessment/Plan: s/p Procedure(s): REMOVAL OF A PD CATHETER (Left) Continue supportive care Daily dry dressing over PD site Continue HD via Tunneled Catheter per Nephrology No further invention from Vascular  LOS: 8 days    Jamesetta So A 03/22/2021

## 2021-03-22 NOTE — Progress Notes (Signed)
Progress Note   Patient: Anthony Hess ZDG:[id number] DOB: [birth year] DOA: 03/14/2021     8 DOS: the patient was seen and examined on 03/22/2021   Brief hospital course: Anthony Hess is a 66 y.o. male with Past medical history of CAD s/p CABG, HTN, CHF, ESRD on PD, history of bladder cancer, hyperparathyroidism, anemia of chronic disease, recently discharged on 1/16, after management for SBP. Patient presented back in the ED secondary to worsening of abdominal pain. In the ED, BP soft 99/81, saturating well on room air. Labs procalcitonin 1.35, WBC 15.7. CT abdomen pelvis with possible enteritis, ascites, pneumoperitoneum due to PD catheter, edema, 3.2 cm AAA. Pt admitted for further management.    1/28: PD catheter removed yesterday.  Outpatient dialysis arranged, can start 1/31.  Awaiting culture data and final antibiotic recommendations prior to discharge.  Assessment and Plan * Sepsis ([name])- (present on admission) POA with hypotension, leukocytosis, lactic acidosis.  Procalcitonin was elevated. Sepsis physiology improved. Blood cultures from admission negative --Continue antibiotics per ID --Follow peritoneal dialysis await culture -negative to date --Monitor hemodynamics  1/29: resolved sepsis.  Spontaneous bacterial peritonitis ([name])- (present on admission) Continue ceftazidine and vancomycin. PD catheter removed by vascular surgery on 1/27. Follow cultures.  1/29: awaiting decision for DC abx.  ??need ID consult on Monday to assist in final ABX plans.  ESRD (end stage renal disease) ([name])- (present on admission) Mgmt per nephrology. PD patient with SBP requiring removal of PD catheter -done on 1/27.   Right IJ dialysis catheter placed on 03/18/21.   Dialysis per nephrology. Outpatient dialysis will be at [facility] T[facility] 12:25, to start 1/31 if pt out of hospital at that time.  AAA (abdominal aortic aneurysm)- (present on admission) Per CT abd/pelvis on 03/14/2021:  Abdominal aortic aneurysm measuring 3.2 cm in maximal diameter again noted and unchanged. --Recommended U/S every 3 years   Hyperlipidemia- (present on admission) Continue statin  Anemia in ESRD (end-stage renal disease) ([name])- (present on admission) Hbg stable, slightly low from baseline. On Epo weekly per nephrology. --Monitor CBC  Atrial fibrillation ([name])- (present on admission) HR controlled.   Continue metoprolol. Telemetry.  S/P CABG x 4 Stable, no chest pain.  Continue ASA, statin  Hypertension- (present on admission) Continue metoprolol. Hold hydralazine, irbesartan with soft BP's, resume when indicated.  1/29 - BP Stable.  History of bladder cancer S/p transurethral resection   Subjective: stable. Asking when he can go home. Cultures negative thus far.  Objective BP (!) 145/99 (BP Location: Left Arm)    Pulse 74    Temp 97.8 F (36.6 C) (Oral)    Resp 16    Ht 5\' 3"  (1.6 m)    Wt 58.8 kg    SpO2 93%    BMI 22.96 kg/m   Physical Exam Vitals and nursing note reviewed.  Constitutional:      General: He is not in acute distress.    Appearance: He is normal weight. He is not ill-appearing, toxic-appearing or diaphoretic.  HENT:     Head: Normocephalic and atraumatic.     Nose: Nose normal.  Eyes:     General: No scleral icterus. Cardiovascular:     Rate and Rhythm: Normal rate. Rhythm irregular.  Pulmonary:     Effort: Pulmonary effort is normal. No respiratory distress.     Breath sounds: No wheezing.  Abdominal:     General: Abdomen is flat. Bowel sounds are normal. There is no distension.     Tenderness: There is  no abdominal tenderness.  Musculoskeletal:     Right lower leg: No edema.     Left lower leg: No edema.     Comments: Right ACW HD catheter  Skin:    General: Skin is warm and dry.     Capillary Refill: Capillary refill takes less than 2 seconds.  Neurological:     General: No focal deficit present.     Mental Status: He is alert and  oriented to person, place, and time.     Data Reviewed:  BMP Latest Ref Rng & Units 03/22/2021 03/21/2021 03/20/2021  Glucose 70 - 99 mg/dL 101(H) 137(H) 94  BUN 8 - 23 mg/dL 62(H) 80(H) 75(H)  Creatinine 0.61 - 1.24 mg/dL 7.24(H) 9.54(H) 8.54(H)  BUN/Creat Ratio 10 - 24 - - -  Sodium 135 - 145 mmol/L 140 141 137  Potassium 3.5 - 5.1 mmol/L 4.2 4.1 3.8  Chloride 98 - 111 mmol/L 103 103 102  CO2 22 - 32 mmol/L 27 23 23   Calcium 8.9 - 10.3 mg/dL 7.4(L) 7.6(L) 6.8(L)   CBC Latest Ref Rng & Units 03/22/2021 03/21/2021 03/20/2021  WBC 4.0 - 10.5 K/uL 9.4 11.0(H) 10.6(H)  Hemoglobin 13.0 - 17.0 g/dL 8.2(L) 8.2(L) 8.3(L)  Hematocrit 39.0 - 52.0 % 27.4(L) 27.4(L) 28.0(L)  Platelets 150 - 400 K/uL 443(H) 450(H) 405(H)    Family Communication: None at bedside.   Disposition: Status is: Inpatient  Remains inpatient appropriate because: Severity of illness on IV antibiotics with cultures pending.  Anticipate discharge in the next 24 to 48 hours once final antibiotic recommendations are made.   Time spent: 35 minutes  Author: Kristopher Oppenheim, DO 03/22/2021 10:56 AM  For on call review [email or web address]

## 2021-03-22 NOTE — Progress Notes (Signed)
[facility], [place] 03/22/21  Subjective:   Hospital day # 8  Patient seen today on second floor Patient resting comfortably in the bed Patient offers no new specific physical complaints   Objective:  Vital signs in last 24 hours:  Temp:  [97.8 F (36.6 C)-98.5 F (36.9 C)] 97.8 F (36.6 C) (01/29 0801) Pulse Rate:  [71-81] 74 (01/29 0801) Resp:  [14-20] 16 (01/29 0801) BP: (87-145)/(56-99) 145/99 (01/29 0801) SpO2:  [89 %-97 %] 93 % (01/29 0801) Weight:  [58.8 kg] 58.8 kg (01/28 0918)  Weight change: -4.25 kg Filed Weights   03/20/21 0500 03/20/21 1346 03/21/21 0918  Weight: 59.1 kg 63 kg 58.8 kg    Intake/Output:    Intake/Output Summary (Last 24 hours) at 03/22/2021 2831 Last data filed at 03/21/2021 1915 Gross per 24 hour  Intake 440 ml  Output 502 ml  Net -62 ml    Physical Exam: General:  No acute distress, laying in the bed  HEENT  anicteric, moist oral mucous membrane  Pulm/lungs  normal breathing effort, lungs are clear to auscultation  CVS/Heart  regular rhythm, no rub or gallop  Abdomen:   Soft, minimal  tenderness, Well-healed surgical scar  Extremities:  No peripheral edema  Neurologic:  Alert, oriented, able to follow commands  Skin:  No acute rashes  Access Rt IJ Permcath placed on 03/18/21 by Dr Lucky Cowboy     Basic Metabolic Panel:  Recent Labs  Lab 03/16/21 0400 03/17/21 0413 03/18/21 0417 03/19/21 0418 03/20/21 0413 03/21/21 0526 03/22/21 0353  NA 137 138 138 138   137 137   139 141 140  K 3.0* 3.5 3.2* 3.6   3.5 3.8   3.8 4.1 4.2  CL 101 99 99 101   101 102   102 103 103  CO2 23 26 26 24   24 23   24 23 27   GLUCOSE 111* 122* 109* 115*   119* 94   100* 137* 101*  BUN 71* 72* 68* 76*   75* 75*   73* 80* 62*  CREATININE 8.05* 8.15* 7.77* 8.71*   8.81* 8.54*   8.29* 9.54* 7.24*  CALCIUM 6.3* 6.7* 7.1* 7.1*   7.1* 6.8*   6.9* 7.6* 7.4*  MG 1.5* 1.7  --   --   --   --   --   PHOS 6.8* 7.1* 6.7* 7.4* 6.2* 7.5*  5.0*     CBC: Recent Labs  Lab 03/18/21 0417 03/19/21 0418 03/20/21 0413 03/21/21 0526 03/22/21 0353  WBC 11.7* 13.4* 10.6* 11.0* 9.4  HGB 9.5* 8.7* 8.3* 8.2* 8.2*  HCT 31.3* 29.0* 28.0* 27.4* 27.4*  MCV 88.7 89.0 89.7 89.8 89.5  PLT 481* 424* 405* 450* 443*      Lab Results  Component Value Date   HEPBSAG NON REACTIVE 03/18/2021   HEPBSAB NON REACTIVE 12/27/2019   HEPBIGM NON REACTIVE 12/27/2019      Microbiology:  Recent Results (from the past 240 hour(s))  Resp Panel by RT-PCR (Flu A&B, Covid) Nasopharyngeal Swab     Status: None   Collection Time: 03/14/21  5:14 PM   Specimen: Nasopharyngeal Swab; Nasopharyngeal(NP) swabs in vial transport medium  Result Value Ref Range Status   SARS Coronavirus 2 by RT PCR NEGATIVE NEGATIVE Final    Comment: (NOTE) SARS-CoV-2 target nucleic acids are NOT DETECTED.  The SARS-CoV-2 RNA is generally detectable in upper respiratory specimens during the acute phase of infection. The lowest concentration of SARS-CoV-2 viral  copies this assay can detect is 138 copies/mL. A negative result does not preclude SARS-Cov-2 infection and should not be used as the sole basis for treatment or other patient management decisions. A negative result may occur with  improper specimen collection/handling, submission of specimen other than nasopharyngeal swab, presence of viral mutation(s) within the areas targeted by this assay, and inadequate number of viral copies(<138 copies/mL). A negative result must be combined with clinical observations, patient history, and epidemiological information. The expected result is Negative.  Fact Sheet for Patients:  EntrepreneurPulse.com.au  Fact Sheet for Healthcare Providers:  IncredibleEmployment.be  This test is no t yet approved or cleared by the [place] FDA and  has been authorized for detection and/or diagnosis of SARS-CoV-2 by FDA under an Emergency Use  Authorization (EUA). This EUA will remain  in effect (meaning this test can be used) for the duration of the COVID-19 declaration under Section 564(b)(1) of the Act, 21 U.S.C.section 360bbb-3(b)(1), unless the authorization is terminated  or revoked sooner.       Influenza A by PCR NEGATIVE NEGATIVE Final   Influenza B by PCR NEGATIVE NEGATIVE Final    Comment: (NOTE) The Xpert Xpress SARS-CoV-2/FLU/RSV plus assay is intended as an aid in the diagnosis of influenza from Nasopharyngeal swab specimens and should not be used as a sole basis for treatment. Nasal washings and aspirates are unacceptable for Xpert Xpress SARS-CoV-2/FLU/RSV testing.  Fact Sheet for Patients: EntrepreneurPulse.com.au  Fact Sheet for Healthcare Providers: IncredibleEmployment.be  This test is not yet approved or cleared by the [place] FDA and has been authorized for detection and/or diagnosis of SARS-CoV-2 by FDA under an Emergency Use Authorization (EUA). This EUA will remain in effect (meaning this test can be used) for the duration of the COVID-19 declaration under Section 564(b)(1) of the Act, 21 U.S.C. section 360bbb-3(b)(1), unless the authorization is terminated or revoked.  Performed at [facility], [address] [place], [place] [address]   Blood Culture (routine x 2)     Status: None   Collection Time: 03/14/21  5:14 PM   Specimen: BLOOD  Result Value Ref Range Status   Specimen Description BLOOD RIGHT ANTECUBITAL  Final   Special Requests   Final    BOTTLES DRAWN AEROBIC AND ANAEROBIC Blood Culture adequate volume   Culture   Final    NO GROWTH 5 DAYS Performed at [facility], [address], [place], [place] [address]    Report Status 03/19/2021 FINAL  Final  Urine Culture     Status: Abnormal   Collection Time: 03/14/21  5:20 PM   Specimen: Urine, Random  Result Value Ref Range Status   Specimen Description   Final     URINE, RANDOM Performed at [facility], [address], [place], [place] [address]    Special Requests   Final    NONE Performed at [facility], [address], [place], [place] [address]    Culture (A)  Final    <10,000 COLONIES/mL INSIGNIFICANT GROWTH Performed at [facility], [place] [address] [place], [place] [address]    Report Status 03/17/2021 FINAL  Final  Body fluid culture w Gram Stain     Status: None   Collection Time: 03/14/21  7:19 PM   Specimen: Peritoneal Dialysate; Body Fluid  Result Value Ref Range Status   Specimen Description   Final    PERITONEAL DIALYSATE Performed at [facility], [place], [place]  [address]    Special Requests   Final    Normal Performed at [facility], [place], [place] [address]    Gram Stain   Final    MODERATE WBC PRESENT,BOTH PMN AND MONONUCLEAR NO ORGANISMS SEEN    Culture   Final    NO GROWTH 3 DAYS Performed at [facility], [place] [address] [place], [place] [address]    Report Status 03/18/2021 FINAL  Final  Blood Culture (routine x 2)     Status: None   Collection Time: 03/14/21  9:12 PM   Specimen: BLOOD  Result Value Ref Range Status   Specimen Description BLOOD RIGHT ANTECUBITAL  Final   Special Requests   Final    BOTTLES DRAWN AEROBIC AND ANAEROBIC Blood Culture adequate volume   Culture   Final    NO GROWTH 5 DAYS Performed at [facility], [address], [place],  [address]    Report Status 03/19/2021 FINAL  Final    Coagulation Studies: No results for input(s): LABPROT, INR in the last 72 hours.   Urinalysis: No results for input(s): COLORURINE, LABSPEC, PHURINE, GLUCOSEU, HGBUR, BILIRUBINUR, KETONESUR, PROTEINUR, UROBILINOGEN, NITRITE, LEUKOCYTESUR in the last 72 hours.  Invalid input(s): APPERANCEUR     Imaging: No results found.   Medications:    cefTAZidime (FORTAZ)  IV Stopped (03/21/21  1801)   dialysis solution 1.5% low-MG/low-CA     sodium chloride     vancomycin      aspirin EC  81 mg Oral Daily   atorvastatin  40 mg Oral Daily   calcitRIOL  0.5 mcg Oral Daily   calcium carbonate  1,250 mg Oral BID WC   Chlorhexidine Gluconate Cloth  6 each Topical Daily   epoetin (EPOGEN/PROCRIT) injection  10,000 Units Subcutaneous Weekly   feeding supplement (NEPRO CARB STEADY)  237 mL Oral TID BM   gentamicin cream  1 application Topical Daily   heparin  5,000 Units Subcutaneous Q8H   magnesium oxide  400 mg Oral Daily   metoprolol succinate  50 mg Oral Daily   multivitamin  1 tablet Oral QHS   nystatin  5 mL Oral TID   potassium chloride  20 mEq Oral Daily   sucroferric oxyhydroxide  1,000 mg Oral TID   Vitamin D (Ergocalciferol)  50,000 Units Oral Q Sat   acetaminophen, benzonatate, bisacodyl, heparin, ondansetron **OR** ondansetron (ZOFRAN) IV, oxyCODONE  Assessment/ Plan:  66 y.o. male with   end stage renal disease on peritoneal dialysis, hypertension, hyperlipidemia, coronary artery disease status post CABG, history of bladder cancer, COPD with ongoing tobacco use, who is    admitted on 03/14/2021 for Spontaneous bacterial peritonitis ([place]) [K65.2] Generalized abdominal pain [R10.84] Gallbladder sludge [K82.8] Sepsis ([place]) [A41.9] Sepsis, due to unspecified organism, unspecified whether acute organ dysfunction present ([place]) [A41.9]  [facility] [place]  #End-stage renal disease now on hemodialysis Patient was on on peritoneal dialysis Appreciate vascular placing right IJ PermCath on 03/18/21.   Patient now transition to hemodialysis Patient's dialysis was initiated on 03/19/21.   Patient is being dialyzed Yesterday To help with discharge planning dialysis coordinator is aware of patient and is working on outpatient center placement.  No need for dialysis today   We will dialyze patient tomorrow  #Acute peritonitis Patient was admitted previously for  the same problem and was discharged on January 16. Patient claims he was taking antibiotics at home as prescribed, but now admits to missing  2-3 antibiotic treatments. Patient is currently receiving daily ceftazidime and vancomycin based on pharmacy dosing.    #Anemia of chronic kidney disease Lab Results  Component Value Date   HGB 8.2 (L) 03/22/2021  Hemoglobin is below target but stable On  EPO 10,000 units IV with dialysis.  #Secondary hyperparathyroidism Lab Results  Component Value Date   PTH 159 (H) 12/27/2019   CALCIUM 7.4 (L) 03/22/2021   CAION 0.74 (LL) 01/20/2021   PHOS 5.0 (H) 03/22/2021  Calcium and phosphorus remains out above target. Continue calcium carbonate and calcitriol.    LOS: [place] 1/29/20238:12 AM  [place], [place]  Note: This note was prepared with Dragon dictation. Any transcription errors are unintentional

## 2021-03-23 ENCOUNTER — Encounter: Payer: Self-pay | Admitting: Vascular Surgery

## 2021-03-23 DIAGNOSIS — K659 Peritonitis, unspecified: Secondary | ICD-10-CM | POA: Diagnosis not present

## 2021-03-23 LAB — RENAL FUNCTION PANEL
Albumin: 1.7 g/dL — ABNORMAL LOW (ref 3.5–5.0)
Anion gap: 11 (ref 5–15)
BUN: 70 mg/dL — ABNORMAL HIGH (ref 8–23)
CO2: 23 mmol/L (ref 22–32)
Calcium: 7.3 mg/dL — ABNORMAL LOW (ref 8.9–10.3)
Chloride: 106 mmol/L (ref 98–111)
Creatinine, Ser: 8.03 mg/dL — ABNORMAL HIGH (ref 0.61–1.24)
GFR, Estimated: 7 mL/min — ABNORMAL LOW (ref >60)
Glucose, Bld: 86 mg/dL (ref 70–99)
Phosphorus: 5.2 mg/dL — ABNORMAL HIGH (ref 2.5–4.6)
Potassium: 4.3 mmol/L (ref 3.5–5.1)
Sodium: 140 mmol/L (ref 135–145)

## 2021-03-23 MED ORDER — SODIUM CHLORIDE 0.9 % IV SOLN
2.0000 g | Freq: Once | INTRAVENOUS | Status: AC
  Administered 2021-03-23: 2 g via INTRAVENOUS
  Filled 2021-03-23: qty 2

## 2021-03-23 MED ORDER — MAGNESIUM OXIDE -MG SUPPLEMENT 400 (240 MG) MG PO TABS: 400.0000 mg | ORAL_TABLET | Freq: Every day | ORAL | 1 refills | Status: DC

## 2021-03-23 MED ORDER — EPOETIN ALFA 10000 UNIT/ML IJ SOLN
INTRAMUSCULAR | Status: AC
  Filled 2021-03-23: qty 1

## 2021-03-23 MED ORDER — HYDRALAZINE HCL 10 MG PO TABS
10.0000 mg | ORAL_TABLET | Freq: Three times a day (TID) | ORAL | Status: DC
  Administered 2021-03-23 – 2021-03-24 (×3): 10 mg via ORAL
  Filled 2021-03-23 (×8): qty 1

## 2021-03-23 MED ORDER — VANCOMYCIN HCL 500 MG/100ML IV SOLN
500.0000 mg | INTRAVENOUS | Status: AC
Start: 2021-03-23 — End: 2021-03-23
  Administered 2021-03-23: 500 mg via INTRAVENOUS
  Filled 2021-03-23: qty 100

## 2021-03-23 MED ORDER — VANCOMYCIN IV (FOR PTA / DISCHARGE USE ONLY): 500.0000 mg | INTRAVENOUS | 0 refills | Status: AC

## 2021-03-23 MED ORDER — CEFTAZIDIME IV (FOR PTA / DISCHARGE USE ONLY): 1.0000 g | INTRAVENOUS | 0 refills | Status: AC

## 2021-03-23 MED ORDER — NEPRO/CARBSTEADY PO LIQD: 237.0000 mL | Freq: Three times a day (TID) | ORAL | 0 refills | Status: DC

## 2021-03-23 MED ORDER — RENA-VITE PO TABS: 1.0000 | ORAL_TABLET | Freq: Every day | ORAL | 1 refills | Status: DC

## 2021-03-23 MED ORDER — IRBESARTAN 150 MG PO TABS
150.0000 mg | ORAL_TABLET | Freq: Every day | ORAL | Status: DC
  Administered 2021-03-23 – 2021-03-24 (×2): 150 mg via ORAL
  Filled 2021-03-23 (×2): qty 1

## 2021-03-23 MED ORDER — HEPARIN SODIUM (PORCINE) 1000 UNIT/ML IJ SOLN
INTRAMUSCULAR | Status: AC
  Filled 2021-03-23: qty 10

## 2021-03-23 NOTE — Progress Notes (Signed)
° °  Date of Admission:  03/14/2021     ID: Anthony Hess is a 66 y.o. male Principal Problem:   Sepsis ([place]) Active Problems:   History of bladder cancer   Hypertension   S/P CABG x 4   Atrial fibrillation (HCC)   ESRD (end stage renal disease) ([place])   Anemia in ESRD (end-stage renal disease) (HCC)   Hyperlipidemia   Spontaneous bacterial peritonitis ([place])   AAA (abdominal aortic aneurysm)    Subjective: Feeling much better No pain abdomen since PD catheter got removed   Medications:   aspirin EC  81 mg Oral Daily   atorvastatin  40 mg Oral Daily   calcitRIOL  0.5 mcg Oral Daily   calcium carbonate  1,250 mg Oral BID WC   Chlorhexidine Gluconate Cloth  6 each Topical Daily   epoetin (EPOGEN/PROCRIT) injection  10,000 Units Subcutaneous Weekly   feeding supplement (NEPRO CARB STEADY)  237 mL Oral TID BM   gentamicin cream  1 application Topical Daily   heparin  5,000 Units Subcutaneous Q8H   hydrALAZINE  10 mg Oral TID   irbesartan  150 mg Oral Daily   magnesium oxide  400 mg Oral Daily   metoprolol succinate  50 mg Oral Daily   multivitamin  1 tablet Oral QHS   nystatin  5 mL Oral TID   potassium chloride  20 mEq Oral Daily   sucroferric oxyhydroxide  1,000 mg Oral TID   Vitamin D (Ergocalciferol)  50,000 Units Oral Q Sat    Objective: Vital signs in last 24 hours: Temp:  [97.6 F (36.4 C)-98.5 F (36.9 C)] 97.9 F (36.6 C) (01/30 1010) Pulse Rate:  [73-80] 80 (01/30 0822) Resp:  [14-20] 19 (01/30 1100) BP: (120-184)/(89-124) 120/89 (01/30 1100) SpO2:  [95 %-97 %] 97 % (01/30 0822) Weight:  [58 kg-59.4 kg] 59.4 kg (01/30 1001)  PHYSICAL EXAM:  General: Alert, cooperative, no distress, emaciated Lungs: Clear to auscultation bilaterally. No Wheezing or Rhonchi. No rales. Heart: Regular rate and rhythm, no murmur, rub or gallop. Abdomen: Soft,  PD cath out, no tenderness  Extremities: atraumatic, no cyanosis. No edema. No clubbing Skin: No rashes or lesions.  Or bruising Lymph: Cervical, supraclavicular normal. Neurologic: Grossly non-focal Rt IJ- HD cath  Lab Results Recent Labs    03/21/21 0526 03/22/21 0353 03/23/21 0423  WBC 11.0* 9.4  --   HGB 8.2* 8.2*  --   HCT 27.4* 27.4*  --   NA 141 140 140  K 4.1 4.2 4.3  CL 103 103 106  CO2 23 27 23   BUN 80* 62* 70*  CREATININE 9.54* 7.24* 8.03*   Liver Panel Recent Labs    03/22/21 0353 03/23/21 0423  ALBUMIN 1.6* 1.7*   Microbiology: 03/14/21- BC- NG  Studies/Results: No results found.   Assessment/Plan: Bacterial peritonitis secondary to peritoneal dialysis catheter Has had chronic leukocytosis since earlier this month in spite of antibiotics. Now the leucocytosis has resolved since cath removed.  will give IV vanco and ceftazidime till 04/02/21   Recent coag negative staph PD catheter related peritonitis   Anemia of chronic disease CAD status post CABG On metoprolol ?  Discussed the management with the patient and the care team. ID will sign off- call if needed

## 2021-03-23 NOTE — Discharge Summary (Addendum)
Physician Discharge Summary   Patient: Anthony Hess MRN: [id number] DOB: 1956-01-06  Admit date:     03/14/2021  Discharge date: 03/23/2021  Discharge Physician: Ezekiel Slocumb   PCP: Pcp, No   Recommendations at discharge:    Follow up with nephrology and outpatient dialysis Follow up PCP in 1-2 weeks Repeat labs in 1-2 weeks Complete IV antibiotics per ID - IV Vancomycin with hemodialysis Follow up with ID  Discharge Diagnoses Active Problems:   Spontaneous bacterial peritonitis ([place])   ESRD (end stage renal disease) ([place])   Anemia in ESRD (end-stage renal disease) ([facility])   Atrial fibrillation ([facility])   Hypertension   S/P CABG x 4   Hyperlipidemia   AAA (abdominal aortic aneurysm)   History of bladder cancer   1/30: Patient medically stable for discharge today with outpatient dialysis arrangements in place.   Hospital Course   Anthony Hess is a 66 y.o. male with Past medical history of CAD s/p CABG, HTN, CHF, ESRD on PD, history of bladder cancer, hyperparathyroidism, anemia of chronic disease, recently discharged on 1/16, after management for SBP. Patient presented back in the ED secondary to worsening of abdominal pain. In the ED, BP soft 99/81, saturating well on room air. Labs procalcitonin 1.35, WBC 15.7. CT abdomen pelvis with possible enteritis, ascites, pneumoperitoneum due to PD catheter, edema, 3.2 cm AAA. Pt admitted for further management.    1/28: PD catheter removed yesterday.  Outpatient dialysis arranged, can start 1/31.  Awaiting culture data and final antibiotic recommendations prior to discharge.  * Sepsis ([place])- (present on admission) POA with hypotension, leukocytosis, lactic acidosis.  Procalcitonin was elevated. Sepsis physiology improved. Blood cultures from admission negative --Continue antibiotics per ID --Follow peritoneal dialysis await culture -negative to date --Monitor hemodynamics  1/29: resolved sepsis.  Spontaneous bacterial  peritonitis ([place])- (present on admission) Continue ceftazidine and vancomycin. PD catheter removed by vascular surgery on 1/27. Follow cultures.  1/29: awaiting decision for DC abx.  ??need ID consult on Monday to assist in final ABX plans.  ESRD (end stage renal disease) ([place])- (present on admission) Mgmt per nephrology. PD patient with SBP requiring removal of PD catheter -done on 1/27.   Right IJ dialysis catheter placed on 03/18/21.   Dialysis per nephrology. Outpatient dialysis will be at [facility] TTS 12:25, to start 1/31 if pt out of hospital at that time.  Anemia in ESRD (end-stage renal disease) ([place])- (present on admission) Hbg stable, slightly low from baseline. On Epo weekly per nephrology. --Monitor CBC  Atrial fibrillation ([place])- (present on admission) HR controlled.   Continue metoprolol. Telemetry.  Hyperlipidemia- (present on admission) Continue statin  S/P CABG x 4 Stable, no chest pain.  Continue ASA, statin  Hypertension- (present on admission) Continue metoprolol. Hold hydralazine, irbesartan with soft BP's, resume when indicated.  1/29 - BP Stable.  AAA (abdominal aortic aneurysm)- (present on admission) Per CT abd/pelvis on 03/14/2021: Abdominal aortic aneurysm measuring 3.2 cm in maximal diameter again noted and unchanged. --Recommended U/S every 3 years   History of bladder cancer S/p transurethral resection        Consultants: Nephrology, Infectious disease Procedures performed: HD catheter placement, PD catheter removal, hemodialysis  Disposition: Home Diet recommendation: Renal  DISCHARGE MEDICATION: Allergies as of 03/24/2021   No Known Allergies      Medication List     TAKE these medications    acetaminophen 500 MG tablet Commonly known as: TYLENOL Take 2 tablets (1,000 mg total) by mouth every  6 (six) hours as needed for mild pain.   aspirin EC 81 MG tablet Take 81 mg by mouth daily.   atorvastatin 40 MG  tablet Commonly known as: LIPITOR Take 1 tablet (40 mg total) by mouth daily.   benzonatate 200 MG capsule Commonly known as: TESSALON Take 200 mg by mouth 3 (three) times daily as needed.   calcium carbonate 1250 (500 Ca) MG tablet Commonly known as: OS-CAL - dosed in mg of elemental calcium Take 1 tablet (1,250 mg total) by mouth 2 (two) times daily between meals.   feeding supplement (NEPRO CARB STEADY) Liqd Take 237 mLs by mouth 3 (three) times daily between meals.   magnesium oxide 400 (240 Mg) MG tablet Commonly known as: MAG-OX Take 1 tablet (400 mg total) by mouth daily.   multivitamin Tabs tablet Take 1 tablet by mouth at bedtime.   ondansetron 8 MG disintegrating tablet Commonly known as: ZOFRAN-ODT Take 8 mg by mouth every 8 (eight) hours as needed.   Velphoro 500 MG chewable tablet Generic drug: sucroferric oxyhydroxide Chew 1,000 mg by mouth 3 (three) times daily.   Vitamin D (Ergocalciferol) 1.25 MG (50000 UNIT) Caps capsule Commonly known as: DRISDOL Take 1 capsule (50,000 Units total) by mouth every 7 (seven) days. What changed: when to take this       ASK your doctor about these medications    cefTAZidime  IVPB Commonly known as: FORTAZ Inject 1-2 g into the vein Every Tuesday,Thursday,and Saturday with dialysis for 9 days. Give cefazidime 1gm on tue and Thur with HD and 2gm on Sat with HD Indication:  peritonitis related to peritoneal dialysis catheter Last Day of Therapy:  04/02/2021 Labs - Once weekly:  CBC/D,  CMP, vancomycin level Ask about: Should I take this medication?   vancomycin  IVPB Inject 500 mg into the vein Every Tuesday,Thursday,and Saturday with dialysis for 9 days. Indication:  peritonitis related to peritoneal dialysis catheter Last Day of Therapy:  04/02/2021 Labs - Once weekly:  CBC/D,  CMP, vancomycin level Ask about: Should I take this medication?               Discharge Care Instructions  (From admission, onward)            Start     Ordered   03/23/21 0000  Discharge wound care:       Comments: Daily dry dressing over Peritoneal catheter site on abdomen   03/23/21 1420            Follow-up Information     Jodi Marble, MD. Go on 03/30/2021.   Specialty: Internal Medicine Why: New primary care appointment Arrive at 10 am Bring insruance card, ID, copay, and home medications Contact information: [place] [address] [phone]         [place]. Call.   Why: Call for assistance with substance abuse issues Contact information: [address] [place] [address] [phone]                 Discharge Exam: [place] [facility]   03/24/21 0500 03/24/21 0815 03/24/21 1045  Weight: 57.7 kg 58.1 kg 57.3 kg   General exam: awake, alert, no acute distress, chronically ill appearing HEENT: moist mucus membranes, hearing grossly normal  Respiratory system: CTAB, no wheezes, rales or rhonchi, normal respiratory effort. Cardiovascular system: normal S1/S2,  RRR, R IJ HD cath in place Gastrointestinal system: soft, NT, ND, no HSM felt, +bowel sounds. Central  nervous system: A&O x3. no gross focal neurologic deficits, normal speech Extremities: moves all , no edema, normal tone Skin: dry, intact, normal temperature Psychiatry: normal mood, congruent affect, judgement and insight appear normal   Condition at discharge: stable  The results of significant diagnostics from this hospitalization (including imaging, microbiology, ancillary and laboratory) are listed below for reference.   Imaging Studies: CT Abdomen Pelvis W Contrast  Result Date: 04/08/2021 CLINICAL DATA:  66 year old male dialysis patient with left lower quadrant abdominal pain since tube removed last week. On antibiotics. Hypotension. EXAM: CT ABDOMEN AND PELVIS WITH CONTRAST TECHNIQUE: Multidetector CT imaging of the abdomen and pelvis was performed using the standard  protocol following bolus administration of intravenous contrast. RADIATION DOSE REDUCTION: This exam was performed according to the departmental dose-optimization program which includes automated exposure control, adjustment of the mA and/or kV according to patient size and/or use of iterative reconstruction technique. CONTRAST:  79mL OMNIPAQUE IOHEXOL 300 MG/ML  SOLN COMPARISON:  CT Abdomen and Pelvis 03/14/2021 and earlier. FINDINGS: Lower chest: Left lower lobe atelectasis seen last month has resolved. Prior sternotomy. No pericardial or pleural effusion. Stable borderline cardiomegaly. Hepatobiliary: Stable liver since last year. Several small benign hepatic hemangiomas, most apparent at the liver dome on series 2, image 9. Questionable mild gallbladder wall thickening, although ascites is present and the gallbladder does not appear distended. No bile duct enlargement. Pancreas: Negative. Spleen: Stable and negative. Adrenals/Urinary Tract: Adrenal glands remain within normal limits. Unchanged appearance of both kidneys since last year, partial renal atrophy and chronic hydronephrosis on the right. No renal contrast excretion on delayed images. Mildly distended urinary bladder. Stomach/Bowel: Peritoneal dialysis catheter has been removed since last month. But there is increased free fluid in the peritoneal cavity, with simple fluid density (series 2 images 40 and 44. No superimposed pneumoperitoneum. Low-density retained stool throughout redundant distal large bowel. Low-density stool and gas throughout the transverse and descending colon. Fluid in the right colon. No convincing large bowel inflammation. Evidence of normal gas containing retrocecal appendix on coronal image 53. Fluid containing small bowel in the lower abdomen and pelvis. Thick-walled proximal jejunum as on series 2, image 47. No transition point identified. Gas containing decompressed small bowel loops in the ventral abdomen. No definite  pneumatosis. Fluid-filled distal stomach.  Similar fluid-filled duodenum. Small mesenteric ventral abdominal hernia again suspected on series 2, image 38. No herniated bowel. Vascular/Lymphatic: Advanced Aortoiliac calcified atherosclerosis. Tortuous abdominal aorta. Major arterial structures in the abdomen and pelvis remain patent despite the atherosclerosis. Portal venous system is patent. No lymphadenopathy identified. Reproductive: Negative. Other: No pelvic free fluid. Musculoskeletal: Prior sternotomy. Moderate thoracolumbar scoliosis. Mild lower lumbar spondylolisthesis with disc and facet degeneration. Previous instrumentation of the proximal right femur. Stable visualized osseous structures. No acute or suspicious osseous lesion identified. IMPRESSION: 1. Peritoneal dialysis catheter has been removed since last month, but peritoneal Ascites has increased since that time. This is nonspecific but consider peritonitis in the setting of abdominal pain and hypotension. 2. Thick-walled proximal small bowel loops, nonspecific. No transition point or convincing bowel obstruction. Infectious enteritis remains possible. 3. Normal appendix. Retained low-density stool throughout the distal colon. No convincing large bowel inflammation. 4. Otherwise stable CT appearance of the abdomen and pelvis. Advanced Aortic Atherosclerosis (ICD10-I70.0). Electronically Signed   By: Genevie Ann M.D.   On: 04/08/2021 05:21   DG Chest Port 1 View  Result Date: 04/08/2021 CLINICAL DATA:  Abdominal pain EXAM: PORTABLE CHEST 1 VIEW COMPARISON:  03/14/2021 FINDINGS:  The heart size and mediastinal contours are within normal limits. Both lungs are clear. The visualized skeletal structures are unremarkable. Remote median sternotomy. Right IJ approach central venous catheter tip at the cavoatrial junction. IMPRESSION: No active disease. Electronically Signed   By: Ulyses Jarred M.D.   On: 04/08/2021 03:41   [place] ASCITES (ABDOMEN  LIMITED)  Result Date: 04/13/2021 CLINICAL DATA:  Assess for ascites EXAM: LIMITED ABDOMEN ULTRASOUND FOR ASCITES TECHNIQUE: Limited ultrasound survey for ascites was performed in all four abdominal quadrants. COMPARISON:  CT 04/08/2021 FINDINGS: Only trace amount of ascites is visible within the left lower quadrant and mid abdominal region. No sizable fluid pocket identified for paracentesis IMPRESSION: Trace too small amount of ascites is present. Electronically Signed   By: Donavan Foil M.D.   On: 04/13/2021 15:48    Microbiology: Results for orders placed or performed during the hospital encounter of 03/14/21  Resp Panel by RT-PCR (Flu A&B, Covid) Nasopharyngeal Swab     Status: None   Collection Time: 03/14/21  5:14 PM   Specimen: Nasopharyngeal Swab; Nasopharyngeal(NP) swabs in vial transport medium  Result Value Ref Range Status   SARS Coronavirus 2 by RT PCR NEGATIVE NEGATIVE Final    Comment: (NOTE) SARS-CoV-2 target nucleic acids are NOT DETECTED.  The SARS-CoV-2 RNA is generally detectable in upper respiratory specimens during the acute phase of infection. The lowest concentration of SARS-CoV-2 viral copies this assay can detect is 138 copies/mL. A negative result does not preclude SARS-Cov-2 infection and should not be used as the sole basis for treatment or other patient management decisions. A negative result may occur with  improper specimen collection/handling, submission of specimen other than nasopharyngeal swab, presence of viral mutation(s) within the areas targeted by this assay, and inadequate number of viral copies(<138 copies/mL). A negative result must be combined with clinical observations, patient history, and epidemiological information. The expected result is Negative.  Fact Sheet for Patients:  EntrepreneurPulse.com.au  Fact Sheet for Healthcare Providers:  IncredibleEmployment.be  This test is no t yet approved or  cleared by the [place] FDA and  has been authorized for detection and/or diagnosis of SARS-CoV-2 by FDA under an Emergency Use Authorization (EUA). This EUA will remain  in effect (meaning this test can be used) for the duration of the COVID-19 declaration under Section 564(b)(1) of the Act, 21 U.S.C.section 360bbb-3(b)(1), unless the authorization is terminated  or revoked sooner.       Influenza A by PCR NEGATIVE NEGATIVE Final   Influenza B by PCR NEGATIVE NEGATIVE Final    Comment: (NOTE) The Xpert Xpress SARS-CoV-2/FLU/RSV plus assay is intended as an aid in the diagnosis of influenza from Nasopharyngeal swab specimens and should not be used as a sole basis for treatment. Nasal washings and aspirates are unacceptable for Xpert Xpress SARS-CoV-2/FLU/RSV testing.  Fact Sheet for Patients: EntrepreneurPulse.com.au  Fact Sheet for Healthcare Providers: IncredibleEmployment.be  This test is not yet approved or cleared by the [place] FDA and has been authorized for detection and/or diagnosis of SARS-CoV-2 by FDA under an Emergency Use Authorization (EUA). This EUA will remain in effect (meaning this test can be used) for the duration of the COVID-19 declaration under Section 564(b)(1) of the Act, 21 U.S.C. section 360bbb-3(b)(1), unless the authorization is terminated or revoked.  Performed at [facility], [address] [place], [place] [address]   Blood Culture (routine x 2)     Status: None   Collection Time: 03/14/21  5:14 PM  Specimen: BLOOD  Result Value Ref Range Status   Specimen Description BLOOD RIGHT ANTECUBITAL  Final   Special Requests   Final    BOTTLES DRAWN AEROBIC AND ANAEROBIC Blood Culture adequate volume   Culture   Final    NO GROWTH 5 DAYS Performed at [facility], [address], [place], [place] [address]    Report Status 03/19/2021 FINAL  Final  Urine Culture     Status:  Abnormal   Collection Time: 03/14/21  5:20 PM   Specimen: Urine, Random  Result Value Ref Range Status   Specimen Description   Final    URINE, RANDOM Performed at [facility], [address] [place], [place] [address]    Special Requests   Final    NONE Performed at [facility], [address], [place], [place] [address]    Culture (A)  Final    <10,000 COLONIES/mL INSIGNIFICANT GROWTH Performed at [facility], [place] [address] [place], [place] [address]    Report Status 03/17/2021 FINAL  Final  Body fluid culture w Gram Stain     Status: None   Collection Time: 03/14/21  7:19 PM   Specimen: Peritoneal Dialysate; Body Fluid  Result Value Ref Range Status   Specimen Description   Final    PERITONEAL DIALYSATE Performed at [facility], [address] [place], [place] [address]    Special Requests   Final    Normal Performed at [facility], [address] [place], [place] [address]    Gram Stain   Final    MODERATE WBC PRESENT,BOTH PMN AND MONONUCLEAR NO ORGANISMS SEEN    Culture   Final    NO GROWTH 3 DAYS Performed at [facility], [place] [address] [place], [place] [address]    Report Status 03/18/2021 FINAL  Final  Blood Culture (routine x 2)     Status: None   Collection Time: 03/14/21  9:12 PM   Specimen: BLOOD  Result Value Ref Range Status   Specimen Description BLOOD RIGHT ANTECUBITAL  Final   Special Requests   Final    BOTTLES DRAWN AEROBIC AND ANAEROBIC Blood Culture adequate volume   Culture   Final    NO GROWTH 5 DAYS Performed at [facility], [address], [place],  [address]    Report Status 03/19/2021 FINAL  Final    Labs: CBC: Recent Labs  Lab 04/15/21 0329  WBC 6.0  HGB 8.5*  HCT 29.3*  MCV 89.1  PLT 025   Basic Metabolic Panel: Recent Labs  Lab 04/15/21 0329 04/16/21 0800  NA  --  142  K  --  4.2  CL  --  105  CO2  --  24  GLUCOSE  --  84  BUN  --   63*  CREATININE 5.71* 7.84*  CALCIUM  --  7.3*  PHOS  --  6.9*   Liver Function Tests: Recent Labs  Lab 04/16/21 0800  ALBUMIN 2.3*   CBG: No results for input(s): GLUCAP in the last 168 hours.  Discharge time spent: greater than 30 minutes.  Signed: Ezekiel Slocumb, DO [facility] 04/21/2021

## 2021-03-23 NOTE — Progress Notes (Signed)
°   03/21/21 0930 03/21/21 0945 03/21/21 1000  Vitals  BP 103/69  --  (!) 87/56  MAP (mmHg) 81  --  68  Pulse Rate  --   --  81  ECG Heart Rate 76 73 80  Resp 17 14 14   During Hemodialysis Assessment  Blood Flow Rate (mL/min) 250 mL/min 250 mL/min 250 mL/min  Arterial Pressure (mmHg) -70 mmHg -70 mmHg -70 mmHg  Venous Pressure (mmHg) 40 mmHg 40 mmHg 40 mmHg  Transmembrane Pressure (mmHg) 70 mmHg 70 mmHg 40 mmHg  Ultrafiltration Rate (mL/min) 400 mL/min 400 mL/min 400 mL/min  Dialysate Flow Rate (mL/min) 300 ml/min 300 ml/min 300 ml/min  Conductivity: Machine  13.8 13.8 13.8  HD Safety Checks Performed Yes Yes Yes  Dialysis Fluid Bolus Normal Saline  --   --   Bolus Amount (mL) 250 mL  --   --   Intra-Hemodialysis Comments Tx initiated (Treatment start 0936) Progressing as prescribed Progressing as prescribed    03/21/21 1015 03/21/21 1030  Vitals  BP 102/67 112/82  MAP (mmHg) 78 92  Pulse Rate 79 78  ECG Heart Rate 80 82  Resp 15 16  During Hemodialysis Assessment  Blood Flow Rate (mL/min) 250 mL/min 250 mL/min  Arterial Pressure (mmHg) -70 mmHg -70 mmHg  Venous Pressure (mmHg) 40 mmHg 40 mmHg  Transmembrane Pressure (mmHg) 40 mmHg 40 mmHg  Ultrafiltration Rate (mL/min) 400 mL/min 400 mL/min  Dialysate Flow Rate (mL/min) 300 ml/min 300 ml/min  Conductivity: Machine  13.8 13.8  HD Safety Checks Performed Yes Yes  Dialysis Fluid Bolus  --   --   Bolus Amount (mL)  --   --   Intra-Hemodialysis Comments Progressing as prescribed Progressing as prescribed

## 2021-03-23 NOTE — Progress Notes (Signed)
Anthony Hess, [place] 03/23/21  Subjective:   Hospital day # 9  Patient seen and evaluated during hemodialysis treatment today. Tolerating well. Outpatient antibiotics have been set up.  Patient to dialyze at [facility].   Objective:  Vital signs in last 24 hours:  Temp:  [97.6 F (36.4 C)-98.5 F (36.9 C)] 97.9 F (36.6 C) (01/30 1010) Pulse Rate:  [73-83] 73 (01/30 1245) Resp:  [14-21] 19 (01/30 1245) BP: (120-184)/(89-124) 136/101 (01/30 1245) SpO2:  [90 %-99 %] 90 % (01/30 1245) Weight:  [58 kg-59.4 kg] 59.4 kg (01/30 1001)  Weight change: -0.8 kg Filed Weights   03/23/21 0500 03/23/21 0945 03/23/21 1001  Weight: 58 kg 59.4 kg 59.4 kg    Intake/Output:    Intake/Output Summary (Last 24 hours) at 03/23/2021 1249 Last data filed at 03/22/2021 1914 Gross per 24 hour  Intake 340 ml  Output 2 ml  Net 338 ml     Physical Exam: General:  No acute distress, laying in the bed  HEENT  anicteric, moist oral mucous membrane  Pulm/lungs  normal breathing effort, lungs are clear to auscultation  CVS/Heart  regular rhythm, no rub or gallop  Abdomen:   Soft, diffusely tender, PD cathter in place  Extremities:  No peripheral edema  Neurologic:  Alert, oriented, able to follow commands  Skin:  No acute rashes  Access  Rt IJ Permcath placed on 03/18/21 by Dr Lucky Cowboy     Basic Metabolic Panel:  Recent Labs  Lab 03/17/21 0413 03/18/21 4709 03/19/21 6283 03/20/21 0413 03/21/21 0526 03/22/21 0353 03/23/21 0423  NA 138   < > 138   137 137   139 141 140 140  K 3.5   < > 3.6   3.5 3.8   3.8 4.1 4.2 4.3  CL 99   < > 101   101 102   102 103 103 106  CO2 26   < > 24   24 23   24 23 27 23   GLUCOSE 122*   < > 115*   119* 94   100* 137* 101* 86  BUN 72*   < > 76*   75* 75*   73* 80* 62* 70*  CREATININE 8.15*   < > 8.71*   8.81* 8.54*   8.29* 9.54* 7.24* 8.03*  CALCIUM 6.7*   < > 7.1*   7.1* 6.8*   6.9* 7.6* 7.4* 7.3*  MG 1.7  --   --   --   --    --   --   PHOS 7.1*   < > 7.4* 6.2* 7.5* 5.0* 5.2*   < > = values in this interval not displayed.      CBC: Recent Labs  Lab 03/18/21 0417 03/19/21 0418 03/20/21 0413 03/21/21 0526 03/22/21 0353  WBC 11.7* 13.4* 10.6* 11.0* 9.4  HGB 9.5* 8.7* 8.3* 8.2* 8.2*  HCT 31.3* 29.0* 28.0* 27.4* 27.4*  MCV 88.7 89.0 89.7 89.8 89.5  PLT 481* 424* 405* 450* 443*       Lab Results  Component Value Date   HEPBSAG NON REACTIVE 03/18/2021   HEPBSAB NON REACTIVE 12/27/2019   HEPBIGM NON REACTIVE 12/27/2019      Microbiology:  Recent Results (from the past 240 hour(s))  Resp Panel by RT-PCR (Flu A&B, Covid) Nasopharyngeal Swab     Status: None   Collection Time: 03/14/21  5:14 PM   Specimen: Nasopharyngeal Swab; Nasopharyngeal(NP) swabs in vial transport medium  Result Value  Ref Range Status   SARS Coronavirus 2 by RT PCR NEGATIVE NEGATIVE Final    Comment: (NOTE) SARS-CoV-2 target nucleic acids are NOT DETECTED.  The SARS-CoV-2 RNA is generally detectable in upper respiratory specimens during the acute phase of infection. The lowest concentration of SARS-CoV-2 viral copies this assay can detect is 138 copies/mL. A negative result does not preclude SARS-Cov-2 infection and should not be used as the sole basis for treatment or other patient management decisions. A negative result may occur with  improper specimen collection/handling, submission of specimen other than nasopharyngeal swab, presence of viral mutation(s) within the areas targeted by this assay, and inadequate number of viral copies(<138 copies/mL). A negative result must be combined with clinical observations, patient history, and epidemiological information. The expected result is Negative.  Fact Sheet for Patients:  EntrepreneurPulse.com.au  Fact Sheet for Healthcare Providers:  IncredibleEmployment.be  This test is no t yet approved or cleared by the [place] FDA and   has been authorized for detection and/or diagnosis of SARS-CoV-2 by FDA under an Emergency Use Authorization (EUA). This EUA will remain  in effect (meaning this test can be used) for the duration of the COVID-19 declaration under Section 564(b)(1) of the Act, 21 U.S.C.section 360bbb-3(b)(1), unless the authorization is terminated  or revoked sooner.       Influenza A by PCR NEGATIVE NEGATIVE Final   Influenza B by PCR NEGATIVE NEGATIVE Final    Comment: (NOTE) The Xpert Xpress SARS-CoV-2/FLU/RSV plus assay is intended as an aid in the diagnosis of influenza from Nasopharyngeal swab specimens and should not be used as a sole basis for treatment. Nasal washings and aspirates are unacceptable for Xpert Xpress SARS-CoV-2/FLU/RSV testing.  Fact Sheet for Patients: EntrepreneurPulse.com.au  Fact Sheet for Healthcare Providers: IncredibleEmployment.be  This test is not yet approved or cleared by the [place] FDA and has been authorized for detection and/or diagnosis of SARS-CoV-2 by FDA under an Emergency Use Authorization (EUA). This EUA will remain in effect (meaning this test can be used) for the duration of the COVID-19 declaration under Section 564(b)(1) of the Act, 21 U.S.C. section 360bbb-3(b)(1), unless the authorization is terminated or revoked.  Performed at [facility], [address] [place], [place] [address]   Blood Culture (routine x 2)     Status: None   Collection Time: 03/14/21  5:14 PM   Specimen: BLOOD  Result Value Ref Range Status   Specimen Description BLOOD RIGHT ANTECUBITAL  Final   Special Requests   Final    BOTTLES DRAWN AEROBIC AND ANAEROBIC Blood Culture adequate volume   Culture   Final    NO GROWTH 5 DAYS Performed at [facility], [address] [place], [place] [address]    Report Status 03/19/2021 FINAL  Final  Urine Culture     Status: Abnormal   Collection Time: 03/14/21   5:20 PM   Specimen: Urine, Random  Result Value Ref Range Status   Specimen Description   Final    URINE, RANDOM Performed at [facility], [address] [place], [place] [address]    Special Requests   Final    NONE Performed at [facility], [address], [place], [place] [address]    Culture (A)  Final    <10,000 COLONIES/mL INSIGNIFICANT GROWTH Performed at [facility], [place] [address], [place],  [address]    Report Status 03/17/2021 FINAL  Final  Body fluid culture w Gram Stain  Status: None   Collection Time: 03/14/21  7:19 PM   Specimen: Peritoneal Dialysate; Body Fluid  Result Value Ref Range Status   Specimen Description   Final    PERITONEAL DIALYSATE Performed at [facility], [address] [place], [place] [address]    Special Requests   Final    Normal Performed at [facility], [address] [place], [place] [address]    Gram Stain   Final    MODERATE WBC PRESENT,BOTH PMN AND MONONUCLEAR NO ORGANISMS SEEN    Culture   Final    NO GROWTH 3 DAYS Performed at [facility], [place] [address] [place], [place] [address]    Report Status 03/18/2021 FINAL  Final  Blood Culture (routine x 2)     Status: None   Collection Time: 03/14/21  9:12 PM   Specimen: BLOOD  Result Value Ref Range Status   Specimen Description BLOOD RIGHT ANTECUBITAL  Final   Special Requests   Final    BOTTLES DRAWN AEROBIC AND ANAEROBIC Blood Culture adequate volume   Culture   Final    NO GROWTH 5 DAYS Performed at [facility], [address] [place], [place] [address]    Report Status 03/19/2021 FINAL  Final    Coagulation Studies: No results for input(s): LABPROT, INR in the last 72 hours.   Urinalysis: No results for input(s): COLORURINE, LABSPEC, PHURINE, GLUCOSEU, HGBUR, BILIRUBINUR, KETONESUR, PROTEINUR, UROBILINOGEN, NITRITE, LEUKOCYTESUR in the last 72 hours.  Invalid input(s): APPERANCEUR      Imaging: No results found.   Medications:    cefTAZidime (FORTAZ)  IV Stopped (03/22/21 1618)   dialysis solution 1.5% low-MG/low-CA     sodium chloride     [START ON 03/24/2021] vancomycin      aspirin EC  81 mg Oral Daily   atorvastatin  40 mg Oral Daily   calcitRIOL  0.5 mcg Oral Daily   calcium carbonate  1,250 mg Oral BID WC   Chlorhexidine Gluconate Cloth  6 each Topical Daily   epoetin (EPOGEN/PROCRIT) injection  10,000 Units Subcutaneous Weekly   feeding supplement (NEPRO CARB STEADY)  237 mL Oral TID BM   gentamicin cream  1 application Topical Daily   heparin  5,000 Units Subcutaneous Q8H   heparin sodium (porcine)       hydrALAZINE  10 mg Oral TID   irbesartan  150 mg Oral Daily   magnesium oxide  400 mg Oral Daily   metoprolol succinate  50 mg Oral Daily   multivitamin  1 tablet Oral QHS   nystatin  5 mL Oral TID   potassium chloride  20 mEq Oral Daily   sucroferric oxyhydroxide  1,000 mg Oral TID   Vitamin D (Ergocalciferol)  50,000 Units Oral Q Sat   acetaminophen, benzonatate, bisacodyl, heparin, ondansetron **OR** ondansetron (ZOFRAN) IV, oxyCODONE  Assessment/ Plan:  66 y.o. male with   end stage renal disease on peritoneal dialysis, hypertension, hyperlipidemia, coronary artery disease status post CABG, history of bladder cancer, COPD with ongoing tobacco use, who is    admitted on 03/14/2021 for Spontaneous bacterial peritonitis ([place]) [K65.2] Generalized abdominal pain [R10.84] Gallbladder sludge [K82.8] Sepsis ([place]) [A41.9] Sepsis, due to unspecified organism, unspecified whether acute organ dysfunction present ([place]) [A41.9]  [facility] [place]  #End-stage renal disease on peritoneal dialysis Appreciate vascular placing right IJ PermCath on 03/18/21.  Dialysis initiated on 03/19/21.   -Patient seen and evaluated during hemodialysis treatment today.  Tolerating well.  We plan to complete dialysis treatment today.  #Acute  peritonitis Patient was admitted previously for the same problem and was discharged on January 16. Patient claims he was taking antibiotics at home as prescribed, but now admits to missing 2-3 antibiotic treatments.  Patient to receive both ceftazidime and vancomycin as an outpatient till 04/02/2021.  Continue Epogen  #Anemia of chronic kidney disease Lab Results  Component Value Date   HGB 8.2 (L) 03/22/2021  Hemoglobin below target.  Will prescribe EPO 10,000 units IV with dialysis.  #Secondary hyperparathyroidism Lab Results  Component Value Date   PTH 159 (H) 12/27/2019   CALCIUM 7.3 (L) 03/23/2021   CAION 0.74 (LL) 01/20/2021   PHOS 5.2 (H) 03/23/2021  Phosphorus at target at 5.2.  Continue to monitor.    LOS: 9 Mirah Nevins 1/30/202312:49 PM  [place], [place]  Note: This note was prepared with Dragon dictation. Any transcription errors are unintentional

## 2021-03-23 NOTE — Progress Notes (Signed)
PHARMACY CONSULT NOTE FOR:  OUTPATIENT  PARENTERAL ANTIBIOTIC THERAPY (OPAT)  Indication: bacterial peritonitis due to peritoneal dialysis catheter Regimen: vancomycin 500mg  IV with HD on TTS and ceftazidime 1gm with HD on Tue and Thur and 2gm with HD on Sat End date: 04/02/2021  IV antibiotic discharge orders are pended. To discharging provider:  please sign these orders via discharge navigator,  Select New Orders & click on the button choice - Manage This Unsigned Work.     Thank you for allowing pharmacy to be a part of this patient's care. Doreene Eland, PharmD, BCPS, BCIDP Work Cell: [phone] 03/23/2021 12:51 PM

## 2021-03-23 NOTE — Treatment Plan (Signed)
Diagnosis: Secondary bacterial peritonitis due to PD ctaheter Baseline Creatinine ESRD on dialysis   No Known Allergies  OPAT Orders Discharge antibiotics: Vancomycin 500mg  on days of dialysis for 2 weeks Ceftazidime 1-1-2grams on Tue/Thur/sat  of dialysis   End Date: 04/02/21    Labs weekly while on IV antibiotics: _X_ CBC with differential  _X_ CMP  _X_ Vancomycin level   Clinic Follow Up Appt:None needed with ID- follow with nephrology

## 2021-03-23 NOTE — TOC Transition Note (Addendum)
Transition of Care [facility]) - CM/SW Discharge Note   Patient Details  Name: Anthony Hess MRN: [id number] Date of Birth: 15-Jun-1955  Transition of [facility]) CM/SW Contact:  Beverly Sessions, RN Phone Number: 03/23/2021, 4:16 PM   Clinical Narrative:    Patient to discharge today. Initially wife expressed concerns about patient returning home.  Wife declined any resources.  After speak again with wife and patient they are in agreement for patient to discharge to home address  Patient to complete IV antibiotics today, and will start outpatient HD on Thursday.  Patient is aware  Wife is currently at a funeral, so patient will go home via taxi.  Taxi voucher printed to floor.  And bedside RN to call taxi after antibiotic infusion complete   [facility] dialysis liaison notified of discharge      Barriers to Discharge: Continued Medical Work up   Patient Goals and CMS Choice        Discharge Placement                       Discharge Plan and Services                                     Social Determinants of Health (SDOH) Interventions     Readmission Risk Interventions Readmission Risk Prevention Plan 03/16/2021 01/09/2020  Transportation Screening Complete Complete  PCP or Specialist Appt within 3-5 Days - Complete  Social Work Consult for [place] Planning/Counseling Complete Complete  Palliative Care Screening Not Applicable Not Applicable  Medication Review Press photographer) Complete Complete  Some recent data might be hidden

## 2021-03-23 NOTE — Progress Notes (Signed)
Met with patient today who had concerns of the chair time that was given on Friday. 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 Patient requested to go to [place]. Clinic gave TTS 9:45am chair time and can start patient on Thursday.

## 2021-03-23 NOTE — Progress Notes (Signed)
°   03/21/21 1045 03/21/21 1100 03/21/21 1115  Vitals  BP 108/82 119/84 98/79  MAP (mmHg) 91 94 87  Pulse Rate 79 81 78  ECG Heart Rate 78 79 79  Resp 17 18 15   During Hemodialysis Assessment  Blood Flow Rate (mL/min) 250 mL/min 250 mL/min 250 mL/min  Arterial Pressure (mmHg) -70 mmHg -70 mmHg -70 mmHg  Venous Pressure (mmHg) 40 mmHg 40 mmHg 40 mmHg  Transmembrane Pressure (mmHg) 40 mmHg 40 mmHg 40 mmHg  Ultrafiltration Rate (mL/min) 400 mL/min 400 mL/min 400 mL/min  Dialysate Flow Rate (mL/min) 300 ml/min 300 ml/min 300 ml/min  Conductivity: Machine  13.8 13.8 13.8  HD Safety Checks Performed Yes Yes  --   Intra-Hemodialysis Comments Progressing as prescribed Progressing as prescribed Progressing as prescribed    03/21/21 1130 03/21/21 1145 03/21/21 1200  Vitals  BP 121/82 111/87 122/85  MAP (mmHg) 95 94 97  Pulse Rate 81 73 71  ECG Heart Rate 81 73 77  Resp 17 15 16   During Hemodialysis Assessment  Blood Flow Rate (mL/min) 250 mL/min 250 mL/min 250 mL/min  Arterial Pressure (mmHg) -70 mmHg -70 mmHg -70 mmHg  Venous Pressure (mmHg) 40 mmHg 40 mmHg 40 mmHg  Transmembrane Pressure (mmHg) 40 mmHg 40 mmHg 40 mmHg  Ultrafiltration Rate (mL/min) 400 mL/min 400 mL/min 400 mL/min  Dialysate Flow Rate (mL/min) 300 ml/min 300 ml/min 300 ml/min  Conductivity: Machine  13.8 13.8 13.8  HD Safety Checks Performed  --   --   --   Intra-Hemodialysis Comments Progressing as prescribed Progressing as prescribed Progressing as prescribed

## 2021-03-23 NOTE — Care Management Important Message (Signed)
Important Message  Patient Details  Name: Anthony Hess MRN: [id number] Date of Birth: [birth year]   Medicare Important Message Given:  Yes     Dannette Barbara 03/23/2021, 11:44 AM

## 2021-03-23 NOTE — Progress Notes (Signed)
Pharmacy Antibiotic Note  Anthony Hess is a 66 y.o. male w/ PMH of CAD s/p CABG, HTN, A. fib, CHF, ESRD on PD, history of bladder cancer, hyperparathyroidism, anemia of chronic disease admitted on 03/14/2021 with SBP.  Pharmacy has been consulted for vancomycin and ceftazidime dosing.  PD patient with SBP requiring removal of PD catheter.  Right IJ dialysis catheter placed on 03/18/21 s/p 1st HD session 01/26 (terminated 1 hour into session) with next planned for today.  Vancomycin Level (03/21/21 0413): 24 mcg/mL  Plan:  1) give vancomycin 500mg  IV x 1 as getting HD today but plan is HD TTS at discharge but 1st outpatient HD can be done 2/2  2) continue ceftazidime 1 gram IV every 24 hours timed after HD  3) See OPAT Order vancomycin and ceftazidime to be given with HD   Height: 5\' 3"  (160 cm) Weight: 59.4 kg (130 lb 15.3 oz) IBW/kg (Calculated) : 56.9  Temp (24hrs), Avg:97.9 F (36.6 C), Min:97.6 F (36.4 C), Max:98.5 F (36.9 C)  Recent Labs  Lab 03/18/21 0417 03/19/21 0418 03/20/21 0413 03/21/21 0526 03/22/21 0353 03/23/21 0423  WBC 11.7* 13.4* 10.6* 11.0* 9.4  --   CREATININE 7.77* 8.71*   8.81* 8.54*   8.29* 9.54* 7.24* 8.03*  VANCORANDOM 24  --  27 24  --   --      Estimated Creatinine Clearance: 7.6 mL/min (A) (by C-G formula based on SCr of 8.03 mg/dL (H)).    No Known Allergies  Antimicrobials this admission: 01/21 ceftazidime >>  01/21 vancomycin >>   Microbiology results: 01/21 BCx: NG final 01/21 WCx (peritoneal dialysate) : NG final 01/21 UCx: insignificant growth 01/21 SARS CoV-2: negative 01/21 influenza A/B: negative    Thank you for allowing pharmacy to be a part of this patients care.  Doreene Eland, PharmD, BCPS, BCIDP Work Cell: [phone] 03/23/2021 2:14 PM

## 2021-03-24 LAB — RENAL FUNCTION PANEL
Albumin: 1.7 g/dL — ABNORMAL LOW (ref 3.5–5.0)
Anion gap: 10 (ref 5–15)
BUN: 48 mg/dL — ABNORMAL HIGH (ref 8–23)
CO2: 26 mmol/L (ref 22–32)
Calcium: 7.6 mg/dL — ABNORMAL LOW (ref 8.9–10.3)
Chloride: 105 mmol/L (ref 98–111)
Creatinine, Ser: 5.93 mg/dL — ABNORMAL HIGH (ref 0.61–1.24)
GFR, Estimated: 10 mL/min — ABNORMAL LOW (ref >60)
Glucose, Bld: 89 mg/dL (ref 70–99)
Phosphorus: 3.8 mg/dL (ref 2.5–4.6)
Potassium: 4.1 mmol/L (ref 3.5–5.1)
Sodium: 141 mmol/L (ref 135–145)

## 2021-03-24 MED ORDER — PENTAFLUOROPROP-TETRAFLUOROETH EX AERO
1.0000 "application " | INHALATION_SPRAY | CUTANEOUS | Status: DC | PRN
  Filled 2021-03-24: qty 30

## 2021-03-24 MED ORDER — SODIUM CHLORIDE 0.9 % IV SOLN: 100.0000 mL | INTRAVENOUS | Status: DC | PRN

## 2021-03-24 MED ORDER — DEXTROSE 5 % IV SOLN
0.5000 g | Freq: Once | INTRAVENOUS | Status: DC
  Filled 2021-03-24: qty 0.5

## 2021-03-24 MED ORDER — LIDOCAINE-PRILOCAINE 2.5-2.5 % EX CREA
1.0000 "application " | TOPICAL_CREAM | CUTANEOUS | Status: DC | PRN
  Filled 2021-03-24: qty 5

## 2021-03-24 MED ORDER — LIDOCAINE HCL (PF) 1 % IJ SOLN
5.0000 mL | INTRAMUSCULAR | Status: DC | PRN
  Filled 2021-03-24: qty 5

## 2021-03-24 MED ORDER — DEXTROSE 5 % IV SOLN
0.5000 g | INTRAVENOUS | Status: DC
  Filled 2021-03-24: qty 0.5

## 2021-03-24 MED ORDER — ALTEPLASE 2 MG IJ SOLR: 2.0000 mg | Freq: Once | INTRAMUSCULAR | Status: DC | PRN

## 2021-03-24 MED ORDER — HEPARIN SODIUM (PORCINE) 1000 UNIT/ML IJ SOLN
INTRAMUSCULAR | Status: AC
  Administered 2021-03-24: 10000 [IU]
  Filled 2021-03-24: qty 10

## 2021-03-24 MED ORDER — HEPARIN SODIUM (PORCINE) 1000 UNIT/ML IJ SOLN
1000.0000 [IU] | Freq: Once | INTRAMUSCULAR | Status: DC
  Filled 2021-03-24: qty 1

## 2021-03-24 MED ORDER — HEPARIN SODIUM (PORCINE) 1000 UNIT/ML DIALYSIS: 1000.0000 [IU] | INTRAMUSCULAR | Status: DC | PRN

## 2021-03-24 NOTE — TOC Transition Note (Addendum)
Transition of Care [facility]) - CM/SW Discharge Note   Patient Details  Name: Anthony Hess MRN: [id number] Date of Birth: 05-07-1955  Transition of Care [facility]) CM/SW Contact:  Beverly Sessions, RN Phone Number: 03/24/2021, 10:07 AM   Clinical Narrative:     Bedside RN to call cab after patient completes HD Cab voucher printed to floor [facility] dialysis liaison notified of discharge RNCM signing off   330pm  received call from patient after he arrive home.  He request resources for anger management.  Provided the name of 2 local counseling services to patient.  Encouraged patient to reached out to his insurance to determine in network providers     Barriers to Discharge: Continued Medical Work up   Patient Goals and CMS Choice        Discharge Placement                       Discharge Plan and Services                                     Social Determinants of Health (SDOH) Interventions     Readmission Risk Interventions Readmission Risk Prevention Plan 03/16/2021 01/09/2020  Transportation Screening Complete Complete  PCP or Specialist Appt within 3-5 Days - Complete  Social Work Consult for [place] Planning/Counseling Complete Complete  Palliative Care Screening Not Applicable Not Applicable  Medication Review Press photographer) Complete Complete  Some recent data might be hidden

## 2021-03-24 NOTE — Progress Notes (Signed)
Patient completed two hours of hemodialysis via (R) CVC using a 3K/2.5 ca++ bath for a net UF of 0.5 liters.  Patient tolerated well with no HD complications.

## 2021-03-24 NOTE — Progress Notes (Signed)
[facility], [place] 03/24/21  Subjective:   Hospital day # 10  Patient seen and evaluated during dialysis   HEMODIALYSIS FLOWSHEET:  Blood Flow Rate (mL/min): 200 mL/min Arterial Pressure (mmHg): -180 mmHg Venous Pressure (mmHg): 110 mmHg Transmembrane Pressure (mmHg): 50 mmHg Ultrafiltration Rate (mL/min): 500 mL/min Dialysate Flow Rate (mL/min): 800 ml/min Conductivity: Machine : 14.1 Conductivity: Machine : 14.1 Dialysis Fluid Bolus: Normal Saline Bolus Amount (mL): 250 mL  No complaints at this time   Objective:  Vital signs in last 24 hours:  Temp:  [97.7 F (36.5 C)-98.8 F (37.1 C)] 98.8 F (37.1 C) (01/31 1045) Pulse Rate:  [72-102] 102 (01/31 1216) Resp:  [16-20] 16 (01/31 1045) BP: (129-173)/(86-122) 141/109 (01/31 1216) SpO2:  [95 %-100 %] 100 % (01/31 1045) Weight:  [57.3 kg-58.1 kg] 57.3 kg (01/31 1045)  Weight change: 1.4 kg Filed Weights   03/24/21 0500 03/24/21 0815 03/24/21 1045  Weight: 57.7 kg 58.1 kg 57.3 kg    Intake/Output:    Intake/Output Summary (Last 24 hours) at 03/24/2021 1331 Last data filed at 03/24/2021 1039 Gross per 24 hour  Intake 6.14 ml  Output 700 ml  Net -693.86 ml     Physical Exam: General:  No acute distress, laying in the bed  HEENT  anicteric, moist oral mucous membrane  Pulm/lungs  normal breathing effort, lungs are clear to auscultation  CVS/Heart  regular rhythm, no rub or gallop  Abdomen:   Soft, diffusely tender  Extremities:  No peripheral edema  Neurologic:  Alert, oriented, able to follow commands  Skin:  No acute rashes  Access  Rt IJ Permcath placed on 03/18/21 by Dr Lucky Cowboy     Basic Metabolic Panel:  Recent Labs  Lab 03/20/21 0413 03/21/21 0526 03/22/21 0353 03/23/21 0423 03/24/21 0429  NA 137   139 141 140 140 141  K 3.8   3.8 4.1 4.2 4.3 4.1  CL 102   102 103 103 106 105  CO2 23   24 23 27 23 26   GLUCOSE 94   100* 137* 101* 86 89  BUN 75*   73* 80* 62* 70*  48*  CREATININE 8.54*   8.29* 9.54* 7.24* 8.03* 5.93*  CALCIUM 6.8*   6.9* 7.6* 7.4* 7.3* 7.6*  PHOS 6.2* 7.5* 5.0* 5.2* 3.8      CBC: Recent Labs  Lab 03/18/21 0417 03/19/21 0418 03/20/21 0413 03/21/21 0526 03/22/21 0353  WBC 11.7* 13.4* 10.6* 11.0* 9.4  HGB 9.5* 8.7* 8.3* 8.2* 8.2*  HCT 31.3* 29.0* 28.0* 27.4* 27.4*  MCV 88.7 89.0 89.7 89.8 89.5  PLT 481* 424* 405* 450* 443*       Lab Results  Component Value Date   HEPBSAG NON REACTIVE 03/18/2021   HEPBSAB NON REACTIVE 12/27/2019   HEPBIGM NON REACTIVE 12/27/2019      Microbiology:  Recent Results (from the past 240 hour(s))  Resp Panel by RT-PCR (Flu A&B, Covid) Nasopharyngeal Swab     Status: None   Collection Time: 03/14/21  5:14 PM   Specimen: Nasopharyngeal Swab; Nasopharyngeal(NP) swabs in vial transport medium  Result Value Ref Range Status   SARS Coronavirus 2 by RT PCR NEGATIVE NEGATIVE Final    Comment: (NOTE) SARS-CoV-2 target nucleic acids are NOT DETECTED.  The SARS-CoV-2 RNA is generally detectable in upper respiratory specimens during the acute phase of infection. The lowest concentration of SARS-CoV-2 viral copies this assay can detect is 138 copies/mL. A negative result does not preclude SARS-Cov-2 infection  and should not be used as the sole basis for treatment or other patient management decisions. A negative result may occur with  improper specimen collection/handling, submission of specimen other than nasopharyngeal swab, presence of viral mutation(s) within the areas targeted by this assay, and inadequate number of viral copies(<138 copies/mL). A negative result must be combined with clinical observations, patient history, and epidemiological information. The expected result is Negative.  Fact Sheet for Patients:  EntrepreneurPulse.com.au  Fact Sheet for Healthcare Providers:  IncredibleEmployment.be  This test is no t yet approved or cleared  by the [place] FDA and  has been authorized for detection and/or diagnosis of SARS-CoV-2 by FDA under an Emergency Use Authorization (EUA). This EUA will remain  in effect (meaning this test can be used) for the duration of the COVID-19 declaration under Section 564(b)(1) of the Act, 21 U.S.C.section 360bbb-3(b)(1), unless the authorization is terminated  or revoked sooner.       Influenza A by PCR NEGATIVE NEGATIVE Final   Influenza B by PCR NEGATIVE NEGATIVE Final    Comment: (NOTE) The Xpert Xpress SARS-CoV-2/FLU/RSV plus assay is intended as an aid in the diagnosis of influenza from Nasopharyngeal swab specimens and should not be used as a sole basis for treatment. Nasal washings and aspirates are unacceptable for Xpert Xpress SARS-CoV-2/FLU/RSV testing.  Fact Sheet for Patients: EntrepreneurPulse.com.au  Fact Sheet for Healthcare Providers: IncredibleEmployment.be  This test is not yet approved or cleared by the [place] FDA and has been authorized for detection and/or diagnosis of SARS-CoV-2 by FDA under an Emergency Use Authorization (EUA). This EUA will remain in effect (meaning this test can be used) for the duration of the COVID-19 declaration under Section 564(b)(1) of the Act, 21 U.S.C. section 360bbb-3(b)(1), unless the authorization is terminated or revoked.  Performed at [facility], [address] [place], [place] [address]   Blood Culture (routine x 2)     Status: None   Collection Time: 03/14/21  5:14 PM   Specimen: BLOOD  Result Value Ref Range Status   Specimen Description BLOOD RIGHT ANTECUBITAL  Final   Special Requests   Final    BOTTLES DRAWN AEROBIC AND ANAEROBIC Blood Culture adequate volume   Culture   Final    NO GROWTH 5 DAYS Performed at [facility], [address] [place], [place] [address]    Report Status 03/19/2021 FINAL  Final  Urine Culture     Status: Abnormal    Collection Time: 03/14/21  5:20 PM   Specimen: Urine, Random  Result Value Ref Range Status   Specimen Description   Final    URINE, RANDOM Performed at [facility], [address] [place], [place] [address]    Special Requests   Final    NONE Performed at [facility], [address], [place], [place] [address]    Culture (A)  Final    <10,000 COLONIES/mL INSIGNIFICANT GROWTH Performed at [facility], [place] [address] [place], [place] [address]    Report Status 03/17/2021 FINAL  Final  Body fluid culture w Gram Stain     Status: None   Collection Time: 03/14/21  7:19 PM   Specimen: Peritoneal Dialysate; Body Fluid  Result Value Ref Range Status   Specimen Description   Final    PERITONEAL DIALYSATE Performed at [facility], [address], [place], [place] [address]    Special Requests   Final    Normal Performed at [facility], [place], [place] [address]    Gram Stain   Final    MODERATE WBC PRESENT,BOTH PMN AND MONONUCLEAR NO ORGANISMS SEEN    Culture   Final    NO GROWTH 3 DAYS Performed at [facility], [place] [address] [place], [place] [address]    Report Status 03/18/2021 FINAL  Final  Blood Culture (routine x 2)     Status: None   Collection Time: 03/14/21  9:12 PM   Specimen: BLOOD  Result Value Ref Range Status   Specimen Description BLOOD RIGHT ANTECUBITAL  Final   Special Requests   Final    BOTTLES DRAWN AEROBIC AND ANAEROBIC Blood Culture adequate volume   Culture   Final    NO GROWTH 5 DAYS Performed at [facility], [address], [place], [place] [address]    Report Status 03/19/2021 FINAL  Final    Coagulation Studies: No results for input(s): LABPROT, INR in the last 72 hours.   Urinalysis: No results for input(s): COLORURINE, LABSPEC, PHURINE, GLUCOSEU, HGBUR, BILIRUBINUR, KETONESUR, PROTEINUR, UROBILINOGEN, NITRITE, LEUKOCYTESUR in the last 72  hours.  Invalid input(s): APPERANCEUR     Imaging: No results found.   Medications:    sodium chloride     sodium chloride     cefTAZidime (FORTAZ)  IV     dialysis solution 1.5% low-MG/low-CA     sodium chloride     vancomycin      aspirin EC  81 mg Oral Daily   atorvastatin  40 mg Oral Daily   calcitRIOL  0.5 mcg Oral Daily   calcium carbonate  1,250 mg Oral BID WC   Chlorhexidine Gluconate Cloth  6 each Topical Daily   epoetin (EPOGEN/PROCRIT) injection  10,000 Units Subcutaneous Weekly   feeding supplement (NEPRO CARB STEADY)  237 mL Oral TID BM   gentamicin cream  1 application Topical Daily   heparin  5,000 Units Subcutaneous Q8H   heparin sodium (porcine)  1,000 Units Intravenous Once in dialysis   hydrALAZINE  10 mg Oral TID   irbesartan  150 mg Oral Daily   magnesium oxide  400 mg Oral Daily   metoprolol succinate  50 mg Oral Daily   multivitamin  1 tablet Oral QHS   nystatin  5 mL Oral TID   potassium chloride  20 mEq Oral Daily   sucroferric oxyhydroxide  1,000 mg Oral TID   Vitamin D (Ergocalciferol)  50,000 Units Oral Q Sat   sodium chloride, sodium chloride, acetaminophen, alteplase, benzonatate, bisacodyl, heparin, heparin, lidocaine (PF), lidocaine-prilocaine, ondansetron **OR** ondansetron (ZOFRAN) IV, oxyCODONE, pentafluoroprop-tetrafluoroeth  Assessment/ Plan:  66 y.o. male with   end stage renal disease on peritoneal dialysis, hypertension, hyperlipidemia, coronary artery disease status post CABG, history of bladder cancer, COPD with ongoing tobacco use, who is    admitted on 03/14/2021 for Spontaneous bacterial peritonitis ([place]) [K65.2] Generalized abdominal pain [R10.84] Gallbladder sludge [K82.8] Sepsis ([place]) [A41.9] Sepsis, due to unspecified organism, unspecified whether acute organ dysfunction present ([place]) [A41.9]  [facility] [place]  #End-stage renal disease on peritoneal dialysis Appreciate vascular placing right IJ PermCath on  03/18/21.  Dialysis initiated on 03/19/21.   -Currently receiving dialysis, tolerating well.  UF goal 500 mL achieved. -Appreciate dialysis coordinator arranging outpatient clinic at [facility] on TTS schedule.  Patient cleared to begin outpatient on Thursday.  #Acute peritonitis Patient was admitted previously for the same problem and was discharged on January 16. Patient  claims he was taking antibiotics at home as prescribed, but now admits to missing 2-3 antibiotic treatments.  Plan to continue ceftazidime and vancomycin as an outpatient until 04/02/2021.    #Anemia of chronic kidney disease Lab Results  Component Value Date   HGB 8.2 (L) 03/22/2021  Hemoglobin below desired target.  Continue EPO with treatments  #Secondary hyperparathyroidism Lab Results  Component Value Date   PTH 159 (H) 12/27/2019   CALCIUM 7.6 (L) 03/24/2021   CAION 0.74 (LL) 01/20/2021   PHOS 3.8 03/24/2021  Calcium slightly decreased with phosphorus at goal.    LOS: [place] 1/31/20231:31 PM  [place], [place]

## 2021-03-24 NOTE — Plan of Care (Signed)

## 2021-03-27 NOTE — Anesthesia Postprocedure Evaluation (Signed)
Anesthesia Post Note  Patient: Anthony Hess  Procedure(s) Performed: REMOVAL OF A PD CATHETER (Left: Abdomen)  Patient location during evaluation: PACU Anesthesia Type: General Level of consciousness: awake and alert Pain management: pain level controlled Vital Signs Assessment: post-procedure vital signs reviewed and stable Respiratory status: spontaneous breathing, nonlabored ventilation, respiratory function stable and patient connected to nasal cannula oxygen Cardiovascular status: blood pressure returned to baseline and stable Postop Assessment: no apparent nausea or vomiting Anesthetic complications: no   No notable events documented.   Last Vitals:  Vitals:   03/24/21 1045 03/24/21 1216  BP: (!) 138/100 (!) 141/109  Pulse: 94 (!) 102  Resp: 16   Temp: 37.1 C   SpO2: 100%     Last Pain:  Vitals:   03/24/21 1220  TempSrc:   PainSc: 0-No pain                 Martha Clan

## 2021-04-07 ENCOUNTER — Encounter: Payer: Self-pay | Admitting: Emergency Medicine

## 2021-04-07 DIAGNOSIS — E785 Hyperlipidemia, unspecified: Secondary | ICD-10-CM | POA: Diagnosis present

## 2021-04-07 DIAGNOSIS — Z79899 Other long term (current) drug therapy: Secondary | ICD-10-CM

## 2021-04-07 DIAGNOSIS — I252 Old myocardial infarction: Secondary | ICD-10-CM

## 2021-04-07 DIAGNOSIS — T8571XA Infection and inflammatory reaction due to peritoneal dialysis catheter, initial encounter: Secondary | ICD-10-CM | POA: Diagnosis not present

## 2021-04-07 DIAGNOSIS — Z951 Presence of aortocoronary bypass graft: Secondary | ICD-10-CM

## 2021-04-07 DIAGNOSIS — Z8551 Personal history of malignant neoplasm of bladder: Secondary | ICD-10-CM

## 2021-04-07 DIAGNOSIS — D631 Anemia in chronic kidney disease: Secondary | ICD-10-CM | POA: Diagnosis present

## 2021-04-07 DIAGNOSIS — G8929 Other chronic pain: Secondary | ICD-10-CM | POA: Diagnosis present

## 2021-04-07 DIAGNOSIS — R188 Other ascites: Secondary | ICD-10-CM | POA: Diagnosis present

## 2021-04-07 DIAGNOSIS — N186 End stage renal disease: Secondary | ICD-10-CM | POA: Diagnosis present

## 2021-04-07 DIAGNOSIS — N2581 Secondary hyperparathyroidism of renal origin: Secondary | ICD-10-CM | POA: Diagnosis present

## 2021-04-07 DIAGNOSIS — Z7982 Long term (current) use of aspirin: Secondary | ICD-10-CM

## 2021-04-07 DIAGNOSIS — Z87891 Personal history of nicotine dependence: Secondary | ICD-10-CM

## 2021-04-07 DIAGNOSIS — E8809 Other disorders of plasma-protein metabolism, not elsewhere classified: Secondary | ICD-10-CM | POA: Diagnosis present

## 2021-04-07 DIAGNOSIS — K529 Noninfective gastroenteritis and colitis, unspecified: Secondary | ICD-10-CM | POA: Diagnosis present

## 2021-04-07 DIAGNOSIS — Y841 Kidney dialysis as the cause of abnormal reaction of the patient, or of later complication, without mention of misadventure at the time of the procedure: Secondary | ICD-10-CM | POA: Diagnosis present

## 2021-04-07 DIAGNOSIS — I2510 Atherosclerotic heart disease of native coronary artery without angina pectoris: Secondary | ICD-10-CM | POA: Diagnosis present

## 2021-04-07 DIAGNOSIS — I9581 Postprocedural hypotension: Secondary | ICD-10-CM | POA: Diagnosis present

## 2021-04-07 DIAGNOSIS — U071 COVID-19: Secondary | ICD-10-CM | POA: Diagnosis present

## 2021-04-07 DIAGNOSIS — K652 Spontaneous bacterial peritonitis: Secondary | ICD-10-CM | POA: Diagnosis present

## 2021-04-07 DIAGNOSIS — Z992 Dependence on renal dialysis: Secondary | ICD-10-CM

## 2021-04-07 DIAGNOSIS — I120 Hypertensive chronic kidney disease with stage 5 chronic kidney disease or end stage renal disease: Secondary | ICD-10-CM | POA: Diagnosis present

## 2021-04-07 DIAGNOSIS — E876 Hypokalemia: Secondary | ICD-10-CM | POA: Diagnosis present

## 2021-04-07 LAB — CBC
HCT: 29.8 % — ABNORMAL LOW (ref 39.0–52.0)
Hemoglobin: 8.7 g/dL — ABNORMAL LOW (ref 13.0–17.0)
MCH: 26.3 pg (ref 26.0–34.0)
MCHC: 29.2 g/dL — ABNORMAL LOW (ref 30.0–36.0)
MCV: 90 fL (ref 80.0–100.0)
Platelets: 177 10*3/uL (ref 150–400)
RBC: 3.31 MIL/uL — ABNORMAL LOW (ref 4.22–5.81)
RDW: 17.8 % — ABNORMAL HIGH (ref 11.5–15.5)
WBC: 5.1 10*3/uL (ref 4.0–10.5)
nRBC: 0 % (ref 0.0–0.2)

## 2021-04-07 LAB — COMPREHENSIVE METABOLIC PANEL
ALT: 70 U/L — ABNORMAL HIGH (ref 0–44)
AST: 50 U/L — ABNORMAL HIGH (ref 15–41)
Albumin: 2.3 g/dL — ABNORMAL LOW (ref 3.5–5.0)
Alkaline Phosphatase: 57 U/L (ref 38–126)
Anion gap: 8 (ref 5–15)
BUN: 23 mg/dL (ref 8–23)
CO2: 27 mmol/L (ref 22–32)
Calcium: 5.9 mg/dL — CL (ref 8.9–10.3)
Chloride: 104 mmol/L (ref 98–111)
Creatinine, Ser: 4.41 mg/dL — ABNORMAL HIGH (ref 0.61–1.24)
GFR, Estimated: 14 mL/min — ABNORMAL LOW (ref >60)
Glucose, Bld: 99 mg/dL (ref 70–99)
Potassium: 2.9 mmol/L — ABNORMAL LOW (ref 3.5–5.1)
Sodium: 139 mmol/L (ref 135–145)
Total Bilirubin: 0.4 mg/dL (ref 0.3–1.2)
Total Protein: 5.4 g/dL — ABNORMAL LOW (ref 6.5–8.1)

## 2021-04-07 LAB — LIPASE, BLOOD: Lipase: 47 U/L (ref 11–51)

## 2021-04-07 NOTE — ED Triage Notes (Signed)
Pt arrived via [facility] from home with c/o LLQ abd pain since removal of G-Tube last week. Pt currently on antibiotic due to. Pt was at dialysis today and was unable to complete due to low BP reading. Pt reports they fed him and sent him home. Pt called EMS due to pain in stomach increasing once home from dialysis. BP in route by EMS 124/28. Pt is A&O x4.

## 2021-04-08 ENCOUNTER — Other Ambulatory Visit: Payer: Self-pay

## 2021-04-08 ENCOUNTER — Emergency Department: Payer: Medicare Other

## 2021-04-08 ENCOUNTER — Inpatient Hospital Stay
Admission: EM | Admit: 2021-04-08 | Discharge: 2021-04-17 | DRG: 907 | Disposition: A | Discharge status: Home / Self Care | Payer: Medicare Other | Attending: Internal Medicine | Admitting: Internal Medicine

## 2021-04-08 DIAGNOSIS — Z951 Presence of aortocoronary bypass graft: Secondary | ICD-10-CM | POA: Diagnosis not present

## 2021-04-08 DIAGNOSIS — K650 Generalized (acute) peritonitis: Secondary | ICD-10-CM | POA: Diagnosis present

## 2021-04-08 DIAGNOSIS — R109 Unspecified abdominal pain: Secondary | ICD-10-CM | POA: Diagnosis not present

## 2021-04-08 DIAGNOSIS — N186 End stage renal disease: Secondary | ICD-10-CM | POA: Diagnosis present

## 2021-04-08 DIAGNOSIS — I10 Essential (primary) hypertension: Secondary | ICD-10-CM | POA: Diagnosis not present

## 2021-04-08 DIAGNOSIS — I252 Old myocardial infarction: Secondary | ICD-10-CM | POA: Diagnosis not present

## 2021-04-08 DIAGNOSIS — U071 COVID-19: Secondary | ICD-10-CM

## 2021-04-08 DIAGNOSIS — I2510 Atherosclerotic heart disease of native coronary artery without angina pectoris: Secondary | ICD-10-CM | POA: Diagnosis present

## 2021-04-08 DIAGNOSIS — D631 Anemia in chronic kidney disease: Secondary | ICD-10-CM | POA: Diagnosis present

## 2021-04-08 DIAGNOSIS — E876 Hypokalemia: Secondary | ICD-10-CM | POA: Diagnosis present

## 2021-04-08 DIAGNOSIS — Z7982 Long term (current) use of aspirin: Secondary | ICD-10-CM | POA: Diagnosis not present

## 2021-04-08 DIAGNOSIS — T8571XD Infection and inflammatory reaction due to peritoneal dialysis catheter, subsequent encounter: Secondary | ICD-10-CM | POA: Diagnosis not present

## 2021-04-08 DIAGNOSIS — Z87891 Personal history of nicotine dependence: Secondary | ICD-10-CM | POA: Diagnosis not present

## 2021-04-08 DIAGNOSIS — E8809 Other disorders of plasma-protein metabolism, not elsewhere classified: Secondary | ICD-10-CM | POA: Diagnosis present

## 2021-04-08 DIAGNOSIS — R188 Other ascites: Secondary | ICD-10-CM

## 2021-04-08 DIAGNOSIS — K652 Spontaneous bacterial peritonitis: Secondary | ICD-10-CM | POA: Diagnosis present

## 2021-04-08 DIAGNOSIS — Z8616 Personal history of COVID-19: Secondary | ICD-10-CM

## 2021-04-08 DIAGNOSIS — R1084 Generalized abdominal pain: Secondary | ICD-10-CM | POA: Diagnosis not present

## 2021-04-08 DIAGNOSIS — N189 Chronic kidney disease, unspecified: Secondary | ICD-10-CM | POA: Diagnosis present

## 2021-04-08 DIAGNOSIS — K529 Noninfective gastroenteritis and colitis, unspecified: Secondary | ICD-10-CM | POA: Diagnosis present

## 2021-04-08 DIAGNOSIS — I120 Hypertensive chronic kidney disease with stage 5 chronic kidney disease or end stage renal disease: Secondary | ICD-10-CM | POA: Diagnosis present

## 2021-04-08 DIAGNOSIS — Z8551 Personal history of malignant neoplasm of bladder: Secondary | ICD-10-CM | POA: Diagnosis not present

## 2021-04-08 DIAGNOSIS — G8929 Other chronic pain: Secondary | ICD-10-CM | POA: Diagnosis present

## 2021-04-08 DIAGNOSIS — Y841 Kidney dialysis as the cause of abnormal reaction of the patient, or of later complication, without mention of misadventure at the time of the procedure: Secondary | ICD-10-CM | POA: Diagnosis present

## 2021-04-08 DIAGNOSIS — T8571XA Infection and inflammatory reaction due to peritoneal dialysis catheter, initial encounter: Secondary | ICD-10-CM | POA: Diagnosis present

## 2021-04-08 DIAGNOSIS — I9581 Postprocedural hypotension: Secondary | ICD-10-CM | POA: Diagnosis present

## 2021-04-08 DIAGNOSIS — E785 Hyperlipidemia, unspecified: Secondary | ICD-10-CM | POA: Diagnosis present

## 2021-04-08 DIAGNOSIS — Z992 Dependence on renal dialysis: Secondary | ICD-10-CM | POA: Diagnosis not present

## 2021-04-08 DIAGNOSIS — Z79899 Other long term (current) drug therapy: Secondary | ICD-10-CM | POA: Diagnosis not present

## 2021-04-08 DIAGNOSIS — N2581 Secondary hyperparathyroidism of renal origin: Secondary | ICD-10-CM | POA: Diagnosis present

## 2021-04-08 HISTORY — DX: Personal history of COVID-19: Z86.16

## 2021-04-08 LAB — RESP PANEL BY RT-PCR (FLU A&B, COVID) ARPGX2
Influenza A by PCR: NEGATIVE
Influenza B by PCR: NEGATIVE
SARS Coronavirus 2 by RT PCR: POSITIVE — AB

## 2021-04-08 LAB — MRSA NEXT GEN BY PCR, NASAL: MRSA by PCR Next Gen: NOT DETECTED

## 2021-04-08 LAB — LACTIC ACID, PLASMA: Lactic Acid, Venous: 1.3 mmol/L (ref 0.5–1.9)

## 2021-04-08 MED ORDER — CALCIUM GLUCONATE-NACL 1-0.675 GM/50ML-% IV SOLN
1.0000 g | Freq: Once | INTRAVENOUS | Status: AC
  Administered 2021-04-08: 1000 mg via INTRAVENOUS
  Filled 2021-04-08: qty 50

## 2021-04-08 MED ORDER — SODIUM CHLORIDE 0.9 % IV SOLN
100.0000 mg | Freq: Every day | INTRAVENOUS | Status: AC
  Administered 2021-04-09 – 2021-04-10 (×2): 100 mg via INTRAVENOUS
  Filled 2021-04-08 (×2): qty 100

## 2021-04-08 MED ORDER — ONDANSETRON HCL 4 MG/2ML IJ SOLN
4.0000 mg | Freq: Four times a day (QID) | INTRAMUSCULAR | Status: DC | PRN
  Administered 2021-04-16: 4 mg via INTRAVENOUS

## 2021-04-08 MED ORDER — MAGNESIUM OXIDE -MG SUPPLEMENT 400 (240 MG) MG PO TABS
400.0000 mg | ORAL_TABLET | Freq: Every day | ORAL | Status: DC
  Administered 2021-04-08 – 2021-04-17 (×9): 400 mg via ORAL
  Filled 2021-04-08 (×9): qty 1

## 2021-04-08 MED ORDER — ACETAMINOPHEN 500 MG PO TABS
1000.0000 mg | ORAL_TABLET | Freq: Four times a day (QID) | ORAL | Status: DC | PRN
  Administered 2021-04-08: 1000 mg via ORAL
  Filled 2021-04-08: qty 2

## 2021-04-08 MED ORDER — ASPIRIN EC 81 MG PO TBEC
81.0000 mg | DELAYED_RELEASE_TABLET | Freq: Every day | ORAL | Status: AC
  Administered 2021-04-08 – 2021-04-15 (×8): 81 mg via ORAL
  Filled 2021-04-08 (×8): qty 1

## 2021-04-08 MED ORDER — MORPHINE SULFATE (PF) 4 MG/ML IV SOLN
4.0000 mg | Freq: Once | INTRAVENOUS | Status: AC
  Administered 2021-04-08: 4 mg via INTRAVENOUS
  Filled 2021-04-08: qty 1

## 2021-04-08 MED ORDER — NEPRO/CARBSTEADY PO LIQD
237.0000 mL | Freq: Three times a day (TID) | ORAL | Status: AC
  Administered 2021-04-08 – 2021-04-15 (×13): 237 mL via ORAL

## 2021-04-08 MED ORDER — ENOXAPARIN SODIUM 30 MG/0.3ML IJ SOSY: 30.0000 mg | PREFILLED_SYRINGE | INTRAMUSCULAR | Status: DC

## 2021-04-08 MED ORDER — GUAIFENESIN-DM 100-10 MG/5ML PO SYRP
10.0000 mL | ORAL_SOLUTION | ORAL | Status: DC | PRN
  Administered 2021-04-11: 10 mL via ORAL
  Filled 2021-04-08: qty 10

## 2021-04-08 MED ORDER — SODIUM CHLORIDE 0.9 % IV SOLN
200.0000 mg | Freq: Once | INTRAVENOUS | Status: AC
  Administered 2021-04-08: 200 mg via INTRAVENOUS
  Filled 2021-04-08: qty 200

## 2021-04-08 MED ORDER — SUCROFERRIC OXYHYDROXIDE 500 MG PO CHEW
1000.0000 mg | CHEWABLE_TABLET | Freq: Three times a day (TID) | ORAL | Status: DC
  Administered 2021-04-08 – 2021-04-11 (×11): 1000 mg via ORAL
  Filled 2021-04-08 (×14): qty 2

## 2021-04-08 MED ORDER — MORPHINE SULFATE (PF) 2 MG/ML IV SOLN
2.0000 mg | INTRAVENOUS | Status: DC | PRN
  Administered 2021-04-08 – 2021-04-11 (×14): 2 mg via INTRAVENOUS
  Filled 2021-04-08 (×16): qty 1

## 2021-04-08 MED ORDER — ONDANSETRON HCL 4 MG/2ML IJ SOLN
4.0000 mg | Freq: Once | INTRAMUSCULAR | Status: AC
  Administered 2021-04-08: 4 mg via INTRAVENOUS
  Filled 2021-04-08: qty 2

## 2021-04-08 MED ORDER — RENA-VITE PO TABS: 1.0000 | ORAL_TABLET | Freq: Every day | ORAL | Status: DC

## 2021-04-08 MED ORDER — IOHEXOL 300 MG/ML  SOLN
75.0000 mL | Freq: Once | INTRAMUSCULAR | Status: AC | PRN
  Administered 2021-04-08: 75 mL via INTRAVENOUS

## 2021-04-08 MED ORDER — VANCOMYCIN HCL 1250 MG/250ML IV SOLN
1250.0000 mg | Freq: Once | INTRAVENOUS | Status: AC
  Administered 2021-04-08: 1250 mg via INTRAVENOUS
  Filled 2021-04-08: qty 250

## 2021-04-08 MED ORDER — SODIUM CHLORIDE 0.9 % IV SOLN
2.0000 g | INTRAVENOUS | Status: DC
  Administered 2021-04-08 – 2021-04-13 (×6): 2 g via INTRAVENOUS
  Filled 2021-04-08: qty 20
  Filled 2021-04-08 (×2): qty 2
  Filled 2021-04-08 (×4): qty 20

## 2021-04-08 MED ORDER — VANCOMYCIN HCL 500 MG/100ML IV SOLN
500.0000 mg | INTRAVENOUS | Status: DC
  Administered 2021-04-09 – 2021-04-11 (×2): 500 mg via INTRAVENOUS
  Filled 2021-04-08 (×3): qty 100

## 2021-04-08 MED ORDER — VITAMIN D (ERGOCALCIFEROL) 1.25 MG (50000 UNIT) PO CAPS: 50000.0000 [IU] | ORAL_CAPSULE | ORAL | Status: DC

## 2021-04-08 MED ORDER — PIPERACILLIN-TAZOBACTAM 3.375 G IVPB 30 MIN
3.3750 g | Freq: Once | INTRAVENOUS | Status: AC
  Administered 2021-04-08: 3.375 g via INTRAVENOUS
  Filled 2021-04-08: qty 50

## 2021-04-08 MED ORDER — ZINC SULFATE 220 (50 ZN) MG PO CAPS
220.0000 mg | ORAL_CAPSULE | Freq: Every day | ORAL | Status: DC
  Administered 2021-04-08 – 2021-04-17 (×9): 220 mg via ORAL
  Filled 2021-04-08 (×9): qty 1

## 2021-04-08 MED ORDER — IRBESARTAN 150 MG PO TABS
150.0000 mg | ORAL_TABLET | Freq: Every day | ORAL | Status: DC
  Administered 2021-04-08 – 2021-04-10 (×4): 150 mg via ORAL
  Filled 2021-04-08 (×3): qty 1

## 2021-04-08 MED ORDER — CHLORHEXIDINE GLUCONATE CLOTH 2 % EX PADS
6.0000 | MEDICATED_PAD | Freq: Every day | CUTANEOUS | Status: DC
  Administered 2021-04-08 – 2021-04-17 (×9): 6 via TOPICAL

## 2021-04-08 MED ORDER — CALCITRIOL 0.25 MCG PO CAPS
0.5000 ug | ORAL_CAPSULE | Freq: Every day | ORAL | Status: DC
  Administered 2021-04-08 – 2021-04-17 (×9): 0.5 ug via ORAL
  Filled 2021-04-08 (×10): qty 2

## 2021-04-08 MED ORDER — ONDANSETRON HCL 4 MG PO TABS: 4.0000 mg | ORAL_TABLET | Freq: Four times a day (QID) | ORAL | Status: DC | PRN

## 2021-04-08 MED ORDER — HYDROCOD POLI-CHLORPHE POLI ER 10-8 MG/5ML PO SUER: 5.0000 mL | Freq: Two times a day (BID) | ORAL | Status: DC | PRN

## 2021-04-08 MED ORDER — METOPROLOL SUCCINATE ER 50 MG PO TB24
50.0000 mg | ORAL_TABLET | Freq: Every day | ORAL | Status: DC
  Administered 2021-04-08 – 2021-04-15 (×8): 50 mg via ORAL
  Filled 2021-04-08 (×9): qty 1

## 2021-04-08 MED ORDER — ASCORBIC ACID 500 MG PO TABS
500.0000 mg | ORAL_TABLET | Freq: Every day | ORAL | Status: DC
  Administered 2021-04-08 – 2021-04-17 (×9): 500 mg via ORAL
  Filled 2021-04-08 (×9): qty 1

## 2021-04-08 MED ORDER — ACETAMINOPHEN 500 MG PO TABS: 1000.0000 mg | ORAL_TABLET | Freq: Four times a day (QID) | ORAL | Status: DC | PRN

## 2021-04-08 MED ORDER — HYDRALAZINE HCL 10 MG PO TABS
10.0000 mg | ORAL_TABLET | Freq: Three times a day (TID) | ORAL | Status: DC
  Administered 2021-04-08 – 2021-04-16 (×23): 10 mg via ORAL
  Filled 2021-04-08 (×29): qty 1

## 2021-04-08 MED ORDER — HEPARIN SODIUM (PORCINE) 5000 UNIT/ML IJ SOLN
5000.0000 [IU] | Freq: Three times a day (TID) | INTRAMUSCULAR | Status: AC
  Administered 2021-04-08 – 2021-04-15 (×22): 5000 [IU] via SUBCUTANEOUS
  Filled 2021-04-08 (×22): qty 1

## 2021-04-08 MED ORDER — CALCIUM CARBONATE 1250 (500 CA) MG PO TABS
1250.0000 mg | ORAL_TABLET | Freq: Two times a day (BID) | ORAL | Status: DC
  Administered 2021-04-08 – 2021-04-17 (×17): 1250 mg via ORAL
  Filled 2021-04-08 (×17): qty 1

## 2021-04-08 MED ORDER — POTASSIUM CHLORIDE CRYS ER 20 MEQ PO TBCR
40.0000 meq | EXTENDED_RELEASE_TABLET | Freq: Once | ORAL | Status: AC
  Administered 2021-04-08: 40 meq via ORAL
  Filled 2021-04-08: qty 2

## 2021-04-08 MED ORDER — ATORVASTATIN CALCIUM 20 MG PO TABS
40.0000 mg | ORAL_TABLET | Freq: Every day | ORAL | Status: DC
  Administered 2021-04-08 – 2021-04-17 (×9): 40 mg via ORAL
  Filled 2021-04-08 (×9): qty 2

## 2021-04-08 MED ORDER — ALBUTEROL SULFATE HFA 108 (90 BASE) MCG/ACT IN AERS
2.0000 | INHALATION_SPRAY | Freq: Four times a day (QID) | RESPIRATORY_TRACT | Status: DC
  Administered 2021-04-08 – 2021-04-11 (×12): 2 via RESPIRATORY_TRACT
  Filled 2021-04-08: qty 6.7

## 2021-04-08 NOTE — Progress Notes (Signed)
BP has been slightly elevated. Dr Francine Graven notified and said to start hydralazine now.

## 2021-04-08 NOTE — Progress Notes (Signed)
[facility]  ROUNDING NOTE   Subjective:   Anthony Hess is a 66 year old male with past medical conditions including bladder cancer, CAD, hypertension, and end-stage renal disease on hemodialysis.  Patient presents to the emergency department with complaints of abdominal pain.  Patient has been admitted for Hypocalcemia [E83.51] Hypokalemia [E87.6] SBP (spontaneous bacterial peritonitis) ([place]) [K65.2] Peritonitis associated with peritoneal dialysis ([place]) [T85.71XA] Abdominal pain, unspecified abdominal location [R10.9]  Patient is known to our practice and receives outpatient dialysis treatments at [facility] on a TTS schedule, supervised by Dr. Holley Raring.  Patient received half of dialysis treatment prior to terminated early due to increased abdominal pain.  Patient was recently admitted for chronic peritonitis resulting in removal of PD catheter and transition to hemodialysis.  Patient seen resting comfortably at this time on stretcher.  States that initially pain had improved after discharge but recently in 1 to 2 days has gotten worse.  Denies nausea and vomiting but states he has had a poor appetite.  Denies shortness of breath or cough.  Denies fever and chills.  Lab work on arrival includes potassium 2.9, calcium 5.9, and creatinine of 1.41 with GFR 14.  Hemoglobin 8.7 with albumin 2.3.  Respiratory panel negative for influenza but positive for COVID-19.  Chest x-ray negative for acute findings CT abdomen pelvis shows increased ascites since removal of PD catheter last month.  We have been consulted to provide dialysis during this admission.   Objective:  Vital signs in last 24 hours:  Temp:  [98.2 F (36.8 C)-99.5 F (37.5 C)] 98.2 F (36.8 C) (02/15 1432) Pulse Rate:  [70-95] 72 (02/15 1432) Resp:  [13-20] 16 (02/15 1432) BP: (104-157)/(78-109) 157/109 (02/15 1432) SpO2:  [91 %-98 %] 98 % (02/15 1432) Weight:  [52.9 kg-57.3 kg] 52.9 kg (02/15 1443)  Weight  change:  Filed Weights   04/08/21 0900 04/08/21 1443  Weight: 57.3 kg 52.9 kg    Intake/Output: I/O last 3 completed shifts: In: 90.5 [IV Piggyback:90.5] Out: -    Intake/Output this shift:  No intake/output data recorded.  Physical Exam: General: NAD, resting on stretcher  Head: Normocephalic, atraumatic. Moist oral mucosal membranes  Eyes: Anicteric  Lungs:  Clear to auscultation, normal effort  Heart: Regular rate and rhythm  Abdomen:  Soft, tender,  Extremities: No peripheral edema.  Neurologic: Nonfocal, moving all four extremities  Skin: No lesions  Access: Right IJ PermCath    Basic Metabolic Panel: Recent Labs  Lab 04/07/21 2258  NA 139  K 2.9*  CL 104  CO2 27  GLUCOSE 99  BUN 23  CREATININE 4.41*  CALCIUM 5.9*    Liver Function Tests: Recent Labs  Lab 04/07/21 2258  AST 50*  ALT 70*  ALKPHOS 57  BILITOT 0.4  PROT 5.4*  ALBUMIN 2.3*   Recent Labs  Lab 04/07/21 2258  LIPASE 47   No results for input(s): AMMONIA in the last 168 hours.  CBC: Recent Labs  Lab 04/07/21 2258  WBC 5.1  HGB 8.7*  HCT 29.8*  MCV 90.0  PLT 177    Cardiac Enzymes: No results for input(s): CKTOTAL, CKMB, CKMBINDEX, TROPONINI in the last 168 hours.  BNP: Invalid input(s): POCBNP  CBG: No results for input(s): GLUCAP in the last 168 hours.  Microbiology: Results for orders placed or performed during the hospital encounter of 04/08/21  Culture, blood (routine x 2)     Status: None (Preliminary result)   Collection Time: 04/08/21  5:45 AM   Specimen:  BLOOD  Result Value Ref Range Status   Specimen Description BLOOD LEFT HAND  Final   Special Requests   Final    BOTTLES DRAWN AEROBIC AND ANAEROBIC Blood Culture results may not be optimal due to an excessive volume of blood received in culture bottles   Culture   Final    NO GROWTH < 12 HOURS Performed at [facility], [address] [place], [place] [address]    Report Status PENDING   Incomplete  Resp Panel by RT-PCR (Flu A&B, Covid) Nasopharyngeal Swab     Status: Abnormal   Collection Time: 04/08/21  5:45 AM   Specimen: Nasopharyngeal Swab; Nasopharyngeal(NP) swabs in vial transport medium  Result Value Ref Range Status   SARS Coronavirus 2 by RT PCR POSITIVE (A) NEGATIVE Final    Comment: (NOTE) SARS-CoV-2 target nucleic acids are DETECTED.  The SARS-CoV-2 RNA is generally detectable in upper respiratory specimens during the acute phase of infection. Positive results are indicative of the presence of the identified virus, but do not rule out bacterial infection or co-infection with other pathogens not detected by the test. Clinical correlation with patient history and other diagnostic information is necessary to determine patient infection status. The expected result is Negative.  Fact Sheet for Patients: EntrepreneurPulse.com.au  Fact Sheet for Healthcare Providers: IncredibleEmployment.be  This test is not yet approved or cleared by the [place] FDA and  has been authorized for detection and/or diagnosis of SARS-CoV-2 by FDA under an Emergency Use Authorization (EUA).  This EUA will remain in effect (meaning this test can be used) for the duration of  the COVID-19 declaration under Section 564(b)(1) of the A ct, 21 U.S.C. section 360bbb-3(b)(1), unless the authorization is terminated or revoked sooner.     Influenza A by PCR NEGATIVE NEGATIVE Final   Influenza B by PCR NEGATIVE NEGATIVE Final    Comment: (NOTE) The Xpert Xpress SARS-CoV-2/FLU/RSV plus assay is intended as an aid in the diagnosis of influenza from Nasopharyngeal swab specimens and should not be used as a sole basis for treatment. Nasal washings and aspirates are unacceptable for Xpert Xpress SARS-CoV-2/FLU/RSV testing.  Fact Sheet for Patients: EntrepreneurPulse.com.au  Fact Sheet for Healthcare  Providers: IncredibleEmployment.be  This test is not yet approved or cleared by the [place] FDA and has been authorized for detection and/or diagnosis of SARS-CoV-2 by FDA under an Emergency Use Authorization (EUA). This EUA will remain in effect (meaning this test can be used) for the duration of the COVID-19 declaration under Section 564(b)(1) of the Act, 21 U.S.C. section 360bbb-3(b)(1), unless the authorization is terminated or revoked.  Performed at [facility], [address] [place], [place] [address]   Culture, blood (routine x 2)     Status: None (Preliminary result)   Collection Time: 04/08/21  5:46 AM   Specimen: BLOOD  Result Value Ref Range Status   Specimen Description BLOOD RIGHT HAND  Final   Special Requests   Final    BOTTLES DRAWN AEROBIC AND ANAEROBIC Blood Culture adequate volume   Culture   Final    NO GROWTH < 12 HOURS Performed at [facility], [address] [place], [place] [address]    Report Status PENDING  Incomplete    Coagulation Studies: No results for input(s): LABPROT, INR in the last 72 hours.  Urinalysis: No results for input(s): COLORURINE, LABSPEC, PHURINE, GLUCOSEU, HGBUR, BILIRUBINUR, KETONESUR, PROTEINUR, UROBILINOGEN, NITRITE, LEUKOCYTESUR in the last 72 hours.  Invalid input(s): APPERANCEUR  Imaging: CT Abdomen Pelvis W Contrast  Result Date: 04/08/2021 CLINICAL DATA:  66 year old male dialysis patient with left lower quadrant abdominal pain since tube removed last week. On antibiotics. Hypotension. EXAM: CT ABDOMEN AND PELVIS WITH CONTRAST TECHNIQUE: Multidetector CT imaging of the abdomen and pelvis was performed using the standard protocol following bolus administration of intravenous contrast. RADIATION DOSE REDUCTION: This exam was performed according to the departmental dose-optimization program which includes automated exposure control, adjustment of the mA and/or kV according to  patient size and/or use of iterative reconstruction technique. CONTRAST:  27mL OMNIPAQUE IOHEXOL 300 MG/ML  SOLN COMPARISON:  CT Abdomen and Pelvis 03/14/2021 and earlier. FINDINGS: Lower chest: Left lower lobe atelectasis seen last month has resolved. Prior sternotomy. No pericardial or pleural effusion. Stable borderline cardiomegaly. Hepatobiliary: Stable liver since last year. Several small benign hepatic hemangiomas, most apparent at the liver dome on series 2, image 9. Questionable mild gallbladder wall thickening, although ascites is present and the gallbladder does not appear distended. No bile duct enlargement. Pancreas: Negative. Spleen: Stable and negative. Adrenals/Urinary Tract: Adrenal glands remain within normal limits. Unchanged appearance of both kidneys since last year, partial renal atrophy and chronic hydronephrosis on the right. No renal contrast excretion on delayed images. Mildly distended urinary bladder. Stomach/Bowel: Peritoneal dialysis catheter has been removed since last month. But there is increased free fluid in the peritoneal cavity, with simple fluid density (series 2 images 40 and 44. No superimposed pneumoperitoneum. Low-density retained stool throughout redundant distal large bowel. Low-density stool and gas throughout the transverse and descending colon. Fluid in the right colon. No convincing large bowel inflammation. Evidence of normal gas containing retrocecal appendix on coronal image 53. Fluid containing small bowel in the lower abdomen and pelvis. Thick-walled proximal jejunum as on series 2, image 47. No transition point identified. Gas containing decompressed small bowel loops in the ventral abdomen. No definite pneumatosis. Fluid-filled distal stomach.  Similar fluid-filled duodenum. Small mesenteric ventral abdominal hernia again suspected on series 2, image 38. No herniated bowel. Vascular/Lymphatic: Advanced Aortoiliac calcified atherosclerosis. Tortuous abdominal  aorta. Major arterial structures in the abdomen and pelvis remain patent despite the atherosclerosis. Portal venous system is patent. No lymphadenopathy identified. Reproductive: Negative. Other: No pelvic free fluid. Musculoskeletal: Prior sternotomy. Moderate thoracolumbar scoliosis. Mild lower lumbar spondylolisthesis with disc and facet degeneration. Previous instrumentation of the proximal right femur. Stable visualized osseous structures. No acute or suspicious osseous lesion identified. IMPRESSION: 1. Peritoneal dialysis catheter has been removed since last month, but peritoneal Ascites has increased since that time. This is nonspecific but consider peritonitis in the setting of abdominal pain and hypotension. 2. Thick-walled proximal small bowel loops, nonspecific. No transition point or convincing bowel obstruction. Infectious enteritis remains possible. 3. Normal appendix. Retained low-density stool throughout the distal colon. No convincing large bowel inflammation. 4. Otherwise stable CT appearance of the abdomen and pelvis. Advanced Aortic Atherosclerosis (ICD10-I70.0). Electronically Signed   By: Genevie Ann M.D.   On: 04/08/2021 05:21   DG Chest Port 1 View  Result Date: 04/08/2021 CLINICAL DATA:  Abdominal pain EXAM: PORTABLE CHEST 1 VIEW COMPARISON:  03/14/2021 FINDINGS: The heart size and mediastinal contours are within normal limits. Both lungs are clear. The visualized skeletal structures are unremarkable. Remote median sternotomy. Right IJ approach central venous catheter tip at the cavoatrial junction. IMPRESSION: No active disease. Electronically Signed   By: Ulyses Jarred M.D.   On: 04/08/2021 03:41     Medications:    cefTRIAXone (ROCEPHIN)  IV Stopped (04/08/21 1406)   [START ON 04/09/2021] remdesivir 100 mg in NS 100 mL     [START ON 04/09/2021] vancomycin      albuterol  2 puff Inhalation Q6H   vitamin C  500 mg Oral Daily   aspirin EC  81 mg Oral Daily   atorvastatin  40 mg  Oral Daily   calcitRIOL  0.5 mcg Oral Daily   calcium carbonate  1,250 mg Oral BID WC   feeding supplement (NEPRO CARB STEADY)  237 mL Oral TID BM   heparin injection (subcutaneous)  5,000 Units Subcutaneous Q8H   irbesartan  150 mg Oral Daily   magnesium oxide  400 mg Oral Daily   metoprolol succinate  50 mg Oral Daily   sucroferric oxyhydroxide  1,000 mg Oral TID   zinc sulfate  220 mg Oral Daily   acetaminophen, guaiFENesin-dextromethorphan, morphine injection, ondansetron **OR** ondansetron (ZOFRAN) IV  Assessment/ Plan:  Mr. Loomis Anacker is a 66 y.o.  male with past medical conditions including bladder cancer, CAD, hypertension, and end-stage renal disease on hemodialysis.  Patient presents to the emergency department with complaints of abdominal pain.  Patient has been admitted for Hypocalcemia [E83.51] Hypokalemia [E87.6] SBP (spontaneous bacterial peritonitis) ([place]) [K65.2] Peritonitis associated with peritoneal dialysis ([place]) [T85.71XA] Abdominal pain, unspecified abdominal location [R10.9]  [facility] [place]/TTS/ Rt Permcath  End-stage renal disease on hemodialysis.  Will maintain outpatient schedule if possible.  Due to stable respiratory status and electrolytes, will defer next treatment until tomorrow.  2. Anemia of chronic kidney disease Normocytic  Lab Results  Component Value Date   HGB 8.7 (L) 04/07/2021    Hemoglobin below desired target, Micera ordered outpatient  3. Secondary Hyperparathyroidism:  Lab Results  Component Value Date   PTH 159 (H) 12/27/2019   CALCIUM 5.9 (LL) 04/07/2021   CAION 0.74 (LL) 01/20/2021   PHOS 3.8 03/24/2021  Calcium currently 5.9, does not below desired target.  Phosphorus within range.  We will begin to correct calcium with dialysis tomorrow.  Will defer IV supplementation of calcium to primary team. Vitamin D and Velphoro ordered outpatient  4.  Hypertension with chronic kidney disease.  Outpatient regimen includes hydralazine,  irbesartan, and metoprolol.  Hydralazine currently held.  5.  Peritonitis associated with peritoneal dialysis.  Empirically placed on vancomycin and Rocephin.    LOS: 0 Teren Zurcher 2/15/20233:32 PM

## 2021-04-08 NOTE — ED Notes (Signed)
Pt unable to void at this time, states due to kidney function he does not void regularly.

## 2021-04-08 NOTE — H&P (Addendum)
History and Physical    Patient: Anthony Hess OAC:[id number] DOB: 1955-02-27 DOA: 04/08/2021 DOS: the patient was seen and examined on 04/08/2021 PCP: Pcp, No  Patient coming from: Home  Chief Complaint:  Chief Complaint  Patient presents with   Abdominal Pain    HPI: Anthony Hess is a 66 y.o. male with medical history significant of end-stage renal disease currently on hemodialysis, dialysis days are T/TH/S, he is status post removal of nonfunctional PD catheter a week ago. He presents to the ER for evaluation of diffuse abdominal pain but mostly worse over the area of insertion of his PD catheter.  He states that he has had pain for a week and initially thought it was going to get better but it has not.  He rates his pain a 7 x 10 in intensity at its worst. Patient went to dialysis one day prior to his admission but states that it was not completed due to hypotension.  He has had diarrhea but denies having any fever, no chills, no cough, no chest pain, no shortness of breath, no nausea.  Review of Systems: As mentioned in the history of present illness. All other systems reviewed and are negative. Past Medical History:  Diagnosis Date   Bladder tumor    Cancer [facility])    Bladder   Chronic kidney disease    renal insufficiency   Coronary artery disease    History of kidney stones    Hypertension    Myocardial infarction [facility])    Wears glasses    Past Surgical History:  Procedure Laterality Date   CAPD INSERTION N/A 12/31/2019   Procedure: LAPAROSCOPIC INSERTION CONTINUOUS AMBULATORY PERITONEAL DIALYSIS  (CAPD) CATHETER;  Surgeon: Jules Husbands, MD;  Location: [facility];  Service: General;  Laterality: N/A;   CAPD REMOVAL N/A 04/10/2020   Procedure: LAPAROSCOPIC REVISION OF CONTINUOUS AMBULATORY PERITONEAL DIALYSIS  (CAPD) CATHETER;  Surgeon: Jules Husbands, MD;  Location: [facility];  Service: General;  Laterality: N/A;   CORONARY ARTERY BYPASS GRAFT N/A 12/27/2018   Procedure:  CORONARY ARTERY BYPASS GRAFTING (CABG) X 4 ON PUMP USING RIGHT & LEFT INTERNAL MAMMARY ARTERY LEFT RADIAL ARTERY ENDOSCOPICALLY HARVESTED;  Surgeon: [name], MD;  Location: [place];  Service: Open Heart Surgery;  Laterality: N/A;   CYSTOSCOPY W/ RETROGRADES Bilateral 05/15/2019   Procedure: CYSTOSCOPY WITH RETROGRADE PYELOGRAM;  Surgeon: Abbie Sons, MD;  Location: [facility];  Service: Urology;  Laterality: Bilateral;   CYSTOSCOPY WITH BIOPSY N/A 05/15/2019   Procedure: CYSTOSCOPY WITH bladder BIOPSY;  Surgeon: Abbie Sons, MD;  Location: [facility];  Service: Urology;  Laterality: N/A;   DIALYSIS/PERMA CATHETER INSERTION N/A 12/28/2019   Procedure: DIALYSIS/PERMA CATHETER INSERTION;  Surgeon: Algernon Huxley, MD;  Location: [place] [facility];  Service: Cardiovascular;  Laterality: N/A;   DIALYSIS/PERMA CATHETER INSERTION N/A 03/18/2021   Procedure: DIALYSIS/PERMA CATHETER INSERTION;  Surgeon: Algernon Huxley, MD;  Location: [place] [facility];  Service: Cardiovascular;  Laterality: N/A;   DIALYSIS/PERMA CATHETER REMOVAL N/A 06/02/2020   Procedure: DIALYSIS/PERMA CATHETER REMOVAL;  Surgeon: Algernon Huxley, MD;  Location: [place] [facility];  Service: Cardiovascular;  Laterality: N/A;   EXCHANGE OF A DIALYSIS CATHETER Right 04/10/2020   Procedure: EXCHANGE OF A DIALYSIS CATHETER;  Surgeon: Jules Husbands, MD;  Location: [facility];  Service: General;  Laterality: Right;   [name]  01/20/2021   Procedure: HERNIA REPAIR INCISIONAL;  Surgeon: Olean Ree, MD;  Location: [facility];  Service: General;;   IR IMAGE GUIDED DRAINAGE PERCUT CATH  PERITONEAL RETROPERIT  04/07/2020   LEFT HEART CATH AND CORONARY ANGIOGRAPHY Left 12/20/2018   Procedure: LEFT HEART CATH AND CORONARY ANGIOGRAPHY;  Surgeon: Isaias Cowman, MD;  Location: [place] [facility];  Service: Cardiovascular;  Laterality: Left;   RADIAL ARTERY HARVEST Left 12/27/2018   Procedure: ENDOSCOPIC RADIAL ARTERY  HARVEST;  Surgeon: [name], MD;  Location: [place];  Service: Open Heart Surgery;  Laterality: Left;   REMOVAL OF A DIALYSIS CATHETER Left 03/20/2021   Procedure: REMOVAL OF A PD CATHETER;  Surgeon: Algernon Huxley, MD;  Location: [facility];  Service: Vascular;  Laterality: Left;   TEE WITHOUT CARDIOVERSION N/A 12/27/2018   Procedure: TRANSESOPHAGEAL ECHOCARDIOGRAM (TEE);  Surgeon: [name], MD;  Location: [place];  Service: Open Heart Surgery;  Laterality: N/A;   TUMOR REMOVAL  2019   Bladder   Social History:  reports that he quit smoking about 2 years ago. His smoking use included cigarettes. He smoked an average of .25 packs per day. He has never used smokeless tobacco. He reports that he does not drink alcohol and does not use drugs.  No Known Allergies  Family History  Family history unknown: Yes    Prior to Admission medications   Medication Sig Start Date End Date Taking? Authorizing Provider  acetaminophen (TYLENOL) 500 MG tablet Take 2 tablets (1,000 mg total) by mouth every 6 (six) hours as needed for mild pain. 01/20/21  Yes Piscoya, Jacqulyn Bath, MD  aspirin EC 81 MG tablet Take 81 mg by mouth daily.   Yes [name], MD  atorvastatin (LIPITOR) 40 MG tablet Take 1 tablet (40 mg total) by mouth daily. 01/01/20  Yes Fritzi Mandes, MD  benzonatate (TESSALON) 200 MG capsule Take 200 mg by mouth 3 (three) times daily as needed. 02/27/21  Yes [provider]  calcitRIOL (ROCALTROL) 0.5 MCG capsule Take 1 capsule (0.5 mcg total) by mouth daily. 01/01/20  Yes Fritzi Mandes, MD  calcium carbonate (OS-CAL - DOSED IN MG OF ELEMENTAL CALCIUM) 1250 (500 Ca) MG tablet Take 1 tablet (1,250 mg total) by mouth 2 (two) times daily between meals. 01/01/20  Yes Fritzi Mandes, MD  hydrALAZINE (APRESOLINE) 10 MG tablet Take 10 mg by mouth 3 (three) times daily. 01/12/21  Yes [provider]  irbesartan (AVAPRO) 150 MG tablet Take 150 mg by mouth daily. 09/18/20  Yes [provider]  magnesium oxide (MAG-OX) 400 (240 Mg) MG tablet Take 1 tablet (400 mg total) by mouth daily. 03/24/21  Yes Nicole Kindred A, DO  metoprolol succinate (TOPROL-XL) 50 MG 24 hr tablet Take 1 tablet (50 mg total) by mouth daily. Take with or immediately following a meal. 01/01/20  Yes Fritzi Mandes, MD  multivitamin (RENA-VIT) TABS tablet Take 1 tablet by mouth at bedtime. 03/23/21  Yes Nicole Kindred A, DO  Nutritional Supplements (FEEDING SUPPLEMENT, NEPRO CARB STEADY,) LIQD Take 237 mLs by mouth 3 (three) times daily between meals. 03/23/21  Yes Nicole Kindred A, DO  VELPHORO 500 MG chewable tablet Chew 1,000 mg by mouth 3 (three) times daily. 01/21/21  Yes [provider]  Vitamin D, Ergocalciferol, (DRISDOL) 1.25 MG (50000 UNIT) CAPS capsule Take 1 capsule (50,000 Units total) by mouth every 7 (seven) days. Patient taking differently: Take 50,000 Units by mouth every Saturday. 01/05/20  Yes Fritzi Mandes, MD  ondansetron (ZOFRAN-ODT) 8 MG disintegrating tablet Take 8 mg by mouth every 8 (eight)  hours as needed. 02/27/21   [provider]    Physical Exam: Vitals:   04/08/21 0223 04/08/21 0400 04/08/21 0700 04/08/21 0800  BP: 104/85 (!) 131/101 (!) 149/98 (!) 141/97  Pulse: 80 84 70 71  Resp: 20 17 13 15   Temp:      TempSrc:      SpO2: 94% 94% 91% 93%   Physical Exam Vitals and nursing note reviewed.  HENT:     Head: Normocephalic and atraumatic.     Mouth/Throat:     Mouth: Mucous membranes are moist.  Cardiovascular:     Rate and Rhythm: Normal rate and regular rhythm.  Pulmonary:     Effort: Pulmonary effort is normal.     Breath sounds: Normal breath sounds.  Abdominal:     General: Abdomen is flat.     Comments: Diffusely tender.  Skin:    General: Skin is warm and dry.  Neurological:     General: No focal deficit present.     Mental Status: He is alert.  Psychiatric:        Mood and Affect: Mood normal.     Data Reviewed: Notes from  primary care and specialist visits, past discharge summaries. Prior diagnostic testing as applicable to current admission diagnoses Updated medications and problem lists for reconciliation ED course, including vitals, labs, imaging, treatment and response to treatment Triage notes and ED providers notes Noted to have a sodium of 2.9, calcium of 5.9, corrected calcium of 6.9 SARS coronavirus PCR was positive Chest x-ray reviewed by me shows no active cardiopulmonary disease CT scan of abdomen and pelvis shows Peritoneal dialysis catheter has been removed since last month,but peritoneal Ascites has increased since that time. This is nonspecific but consider peritonitis in the setting of abdominal pain and hypotension. Thick-walled proximal small bowel loops, nonspecific. No transition point or convincing bowel obstruction. Infectious enteritis remains possible. Normal appendix. Retained low-density stool throughout the distal colon. No convincing large bowel inflammation. Twelve-lead EKG reviewed by me shows normal sinus rhythm.  There are no new results to review at this time.  Assessment and Plan: Principal Problem:   Peritonitis associated with peritoneal dialysis ([place]) Active Problems:   Anemia in ESRD (end-stage renal disease) (HCC)   Hypocalcemia   Hypokalemia   COVID-19 virus infection     Peritonitis associated with peritoneal dialysis Patient presents for evaluation of diffuse abdominal pain following removal of nonfunctional PD catheter. Imaging shows increase peritoneal ascites We will place patient empirically on vancomycin and Rocephin We will consult nephrology     End-stage renal disease on hemodialysis with associated anemia Dialysis days are T/TH/S We will consult nephrology for renal replacement therapy     COVID-19 viral infection We will treat patient with remdesivir per protocol He is not hypoxic and does not require systemic steroids at this  time Supportive care with antitussives, bronchodilator therapy and vitamins    Hypocalcemia Patient has a serum calcium of 5.9 and corrected calcium of 6.3 We will give a dose of calcium gluconate IV Continue calcium supplement    Hypokalemia Supplement potassium   Advance Care Planning:   Code Status: Full Code   Consults: Nephrology  Family Communication: Greater than 50% of time was spent discussing patient's condition and plan of care with him at the bedside.  All questions and concerns have been addressed.  He verbalized understanding and agrees with the plan.  Severity of Illness: The appropriate patient status for this patient is INPATIENT. Inpatient  status is judged to be reasonable and necessary in order to provide the required intensity of service to ensure the patient's safety. The patient's presenting symptoms, physical exam findings, and initial radiographic and laboratory data in the context of their chronic comorbidities is felt to place them at high risk for further clinical deterioration. Furthermore, it is not anticipated that the patient will be medically stable for discharge from the hospital within 2 midnights of admission.   * I certify that at the point of admission it is my clinical judgment that the patient will require inpatient hospital care spanning beyond 2 midnights from the point of admission due to high intensity of service, high risk for further deterioration and high frequency of surveillance required.*  Author: Collier Bullock, MD 04/08/2021 9:16 AM  For on call review [email or web address].

## 2021-04-08 NOTE — ED Provider Notes (Signed)
[facility] Provider Note    Event Date/Time   First MD Initiated Contact with Patient 04/08/21 0259     (approximate)   History   Abdominal Pain   HPI  Anthony Hess is a 66 y.o. male brought to the ED via EMS from home with a chief complaint of abdominal pain.  Patient reports left lower quadrant abdominal pain since removal of nonfunctional PD catheter last week.  Patient also with a history of ESRD on HD T/TH/SAT; he was at dialysis yesterday and they were able to completed secondary a low blood pressure reading.  Patient denies fever, chills, cough, chest pain, shortness of breath, nausea, vomiting or diarrhea.  Last bowel movement yesterday which was normal for patient.  Makes very little urine at baseline.     Past Medical History   Past Medical History:  Diagnosis Date   Bladder tumor    Cancer [facility])    Bladder   Chronic kidney disease    renal insufficiency   Coronary artery disease    History of kidney stones    Hypertension    Myocardial infarction [facility])    Wears glasses      Active Problem List   Patient Active Problem List   Diagnosis Date Noted   SBP (spontaneous bacterial peritonitis) ([place]) 04/08/2021   AAA (abdominal aortic aneurysm) 03/18/2021   Sepsis ([place]) 03/14/2021   Hypocalcemia    Leukocytosis 03/05/2021   Spontaneous bacterial peritonitis ([place]) 03/04/2021   Incisional hernia, without obstruction or gangrene    PD catheter dysfunction ([place]) 04/13/2020   Chronic kidney disease due to hypertension 01/30/2020   Hyperparathyroidism due to renal insufficiency ([place]) 01/30/2020   Acute peritonitis ([place]) 01/08/2020   Hypotension 01/04/2020   Anemia in ESRD (end-stage renal disease) ([place]) 01/04/2020   Hyperlipidemia 01/04/2020   Mass of left side of neck 01/04/2020   Senile purpura ([place]) 01/04/2020   Chronic HFrEF (heart failure with reduced ejection fraction) (HCC)    Hydroureteronephrosis    Atrial fibrillation ([place])  01/05/2019   ESRD (end stage renal disease) ([place]) 01/05/2019   Anemia in chronic kidney disease (CODE) 01/05/2019   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   Presence of aortocoronary bypass graft 12/27/2018   Emphysema lung ([place]) 04/14/2018   Bilateral hydronephrosis 04/03/2018   Hypertension 03/27/2018   Cigarette smoker 03/20/2018   History of bladder cancer 03/20/2018     Past Surgical History   Past Surgical History:  Procedure Laterality Date   CAPD INSERTION N/A 12/31/2019   Procedure: LAPAROSCOPIC INSERTION CONTINUOUS AMBULATORY PERITONEAL DIALYSIS  (CAPD) CATHETER;  Surgeon: Jules Husbands, MD;  Location: [facility] OR[facility];  Service: General;  Laterality: N/A;   CAPD REMOVAL N/A 04/10/2020   Procedure: LAPAROSCOPIC REVISION OF CONTINUOUS AMBULATORY PERITONEAL DIALYSIS  (CAPD) CATHETER;  Surgeon: Jules Husbands, MD;  Location: [facility];  Service: General;  Laterality: N/A;   CORONARY ARTERY BYPASS GRAFT N/A 12/27/2018   Procedure: CORONARY ARTERY BYPASS GRAFTING (CABG) X 4 ON PUMP USING RIGHT & LEFT INTERNAL MAMMARY ARTERY LEFT RADIAL ARTERY ENDOSCOPICALLY HARVESTED;  Surgeon: [name], MD;  Location: [place];  Service: Open Heart Surgery;  Laterality: N/A;   CYSTOSCOPY W/ RETROGRADES Bilateral 05/15/2019   Procedure: CYSTOSCOPY WITH RETROGRADE PYELOGRAM;  Surgeon: Abbie Sons, MD;  Location: [facility];  Service: Urology;  Laterality: Bilateral;   CYSTOSCOPY WITH BIOPSY N/A 05/15/2019   Procedure: CYSTOSCOPY WITH bladder BIOPSY;  Surgeon: Abbie Sons, MD;  Location: [facility];  Service: Urology;  Laterality: N/A;   DIALYSIS/PERMA CATHETER INSERTION N/A 12/28/2019   Procedure: DIALYSIS/PERMA CATHETER INSERTION;  Surgeon: Algernon Huxley, MD;  Location: [place] [facility];  Service: Cardiovascular;  Laterality: N/A;   DIALYSIS/PERMA CATHETER INSERTION N/A 03/18/2021   Procedure: DIALYSIS/PERMA CATHETER INSERTION;  Surgeon: Algernon Huxley, MD;  Location: [place] [facility];  Service: Cardiovascular;   Laterality: N/A;   DIALYSIS/PERMA CATHETER REMOVAL N/A 06/02/2020   Procedure: DIALYSIS/PERMA CATHETER REMOVAL;  Surgeon: Algernon Huxley, MD;  Location: [place] [facility];  Service: Cardiovascular;  Laterality: N/A;   EXCHANGE OF A DIALYSIS CATHETER Right 04/10/2020   Procedure: EXCHANGE OF A DIALYSIS CATHETER;  Surgeon: Jules Husbands, MD;  Location: [facility];  Service: General;  Laterality: Right;   [place]  01/20/2021   Procedure: HERNIA REPAIR INCISIONAL;  Surgeon: Olean Ree, MD;  Location: [facility];  Service: General;;   IR IMAGE GUIDED DRAINAGE PERCUT CATH  PERITONEAL RETROPERIT  04/07/2020   LEFT HEART CATH AND CORONARY ANGIOGRAPHY Left 12/20/2018   Procedure: LEFT HEART CATH AND CORONARY ANGIOGRAPHY;  Surgeon: Isaias Cowman, MD;  Location: [place] [facility];  Service: Cardiovascular;  Laterality: Left;   RADIAL ARTERY HARVEST Left 12/27/2018   Procedure: ENDOSCOPIC RADIAL ARTERY HARVEST;  Surgeon: [name], MD;  Location: [place];  Service: Open Heart Surgery;  Laterality: Left;   REMOVAL OF A DIALYSIS CATHETER Left 03/20/2021   Procedure: REMOVAL OF A PD CATHETER;  Surgeon: Algernon Huxley, MD;  Location: [facility];  Service: Vascular;  Laterality: Left;   TEE WITHOUT CARDIOVERSION N/A 12/27/2018   Procedure: TRANSESOPHAGEAL ECHOCARDIOGRAM (TEE);  Surgeon: [name], MD;  Location: [place];  Service: Open Heart Surgery;  Laterality: N/A;   TUMOR REMOVAL  2019   Bladder     Home Medications   Prior to Admission medications   Medication Sig Start Date End Date Taking? Authorizing Provider  acetaminophen (TYLENOL) 500 MG tablet Take 2 tablets (1,000 mg total) by mouth every 6 (six) hours as needed for mild pain. 01/20/21  Yes Piscoya, Jacqulyn Bath, MD  aspirin EC 81 MG tablet Take 81 mg by mouth daily.   Yes [name], MD  atorvastatin (LIPITOR) 40 MG tablet Take 1 tablet (40 mg total) by mouth daily. 01/01/20  Yes Fritzi Mandes, MD  benzonatate  (TESSALON) 200 MG capsule Take 200 mg by mouth 3 (three) times daily as needed. 02/27/21  Yes [provider]  calcitRIOL (ROCALTROL) 0.5 MCG capsule Take 1 capsule (0.5 mcg total) by mouth daily. 01/01/20  Yes Fritzi Mandes, MD  calcium carbonate (OS-CAL - DOSED IN MG OF ELEMENTAL CALCIUM) 1250 (500 Ca) MG tablet Take 1 tablet (1,250 mg total) by mouth 2 (two) times daily between meals. 01/01/20  Yes Fritzi Mandes, MD  hydrALAZINE (APRESOLINE) 10 MG tablet Take 10 mg by mouth 3 (three) times daily. 01/12/21  Yes [provider]  irbesartan (AVAPRO) 150 MG tablet Take 150 mg by mouth daily. 09/18/20  Yes [provider]  magnesium oxide (MAG-OX) 400 (240 Mg) MG tablet Take 1 tablet (400 mg total) by mouth daily. 03/24/21  Yes Nicole Kindred A, DO  metoprolol succinate (TOPROL-XL) 50 MG 24 hr tablet Take 1 tablet (50 mg total) by mouth daily. Take with or immediately following a meal. 01/01/20  Yes Fritzi Mandes, MD  multivitamin (RENA-VIT) TABS tablet Take 1 tablet by mouth at bedtime. 03/23/21  Yes Ezekiel Slocumb, DO  Nutritional  Supplements (FEEDING SUPPLEMENT, NEPRO CARB STEADY,) LIQD Take 237 mLs by mouth 3 (three) times daily between meals. 03/23/21  Yes Nicole Kindred A, DO  VELPHORO 500 MG chewable tablet Chew 1,000 mg by mouth 3 (three) times daily. 01/21/21  Yes [provider]  Vitamin D, Ergocalciferol, (DRISDOL) 1.25 MG (50000 UNIT) CAPS capsule Take 1 capsule (50,000 Units total) by mouth every 7 (seven) days. Patient taking differently: Take 50,000 Units by mouth every Saturday. 01/05/20  Yes Fritzi Mandes, MD  ondansetron (ZOFRAN-ODT) 8 MG disintegrating tablet Take 8 mg by mouth every 8 (eight) hours as needed. 02/27/21   [provider]     Allergies  Patient has no known allergies.   Family History   Family History  Family history unknown: Yes     Physical Exam  Triage Vital Signs: ED Triage Vitals [04/07/21 2248]  Enc Vitals Group      BP 106/78     Pulse Rate 95     Resp 16     Temp 99.5 F (37.5 C)     Temp Source Oral     SpO2 96 %     Weight      Height      Head Circumference      Peak Flow      Pain Score      Pain Loc      Pain Edu?      Excl. in [place]?     Updated Vital Signs: BP (!) 131/101    Pulse 84    Temp 99.5 F (37.5 C) (Oral)    Resp 17    SpO2 94%    General: Asleep, awakened for exam, no distress.  CV:  RRR.  Good peripheral perfusion.  Resp:  Normal effort.  CTA B. Abd:  Mild tenderness at PD cath removal site without rebound or guarding.  Mild distention.  Postop site C/D/I. Other:  No pedal edema.   ED Results / Procedures / Treatments  Labs (all labs ordered are listed, but only abnormal results are displayed) Labs Reviewed  COMPREHENSIVE METABOLIC PANEL - Abnormal; Notable for the following components:      Result Value   Potassium 2.9 (*)    Creatinine, Ser 4.41 (*)    Calcium 5.9 (*)    Total Protein 5.4 (*)    Albumin 2.3 (*)    AST 50 (*)    ALT 70 (*)    GFR, Estimated 14 (*)    All other components within normal limits  CBC - Abnormal; Notable for the following components:   RBC 3.31 (*)    Hemoglobin 8.7 (*)    HCT 29.8 (*)    MCHC 29.2 (*)    RDW 17.8 (*)    All other components within normal limits  CULTURE, BLOOD (ROUTINE X 2)  CULTURE, BLOOD (ROUTINE X 2)  RESP PANEL BY RT-PCR (FLU A&B, COVID) ARPGX2  LIPASE, BLOOD  URINALYSIS, ROUTINE W REFLEX MICROSCOPIC  LACTIC ACID, PLASMA  LACTIC ACID, PLASMA     EKG  ED ECG REPORT I, Asriel Westrup J, the attending physician, personally viewed and interpreted this ECG.   Date: 04/08/2021  EKG Time: 0416  Rate: 74  Rhythm: normal sinus rhythm  Axis: Normal  Intervals:nonspecific intraventricular conduction delay  ST&T Change: Nonspecific    RADIOLOGY I have personally reviewed patient's chest x-ray as well as CT abdomen pelvis, as well as the radiology interpretation:  Chest x-ray: No acute  cardiopulmonary process  CT abdomen pelvis: Increased peritoneal ascites, thick-walled proximal small bowel loops without bowel obstruction but infectious enteritis considered  Official radiology report(s): CT Abdomen Pelvis W Contrast  Result Date: 04/08/2021 CLINICAL DATA:  66 year old male dialysis patient with left lower quadrant abdominal pain since tube removed last week. On antibiotics. Hypotension. EXAM: CT ABDOMEN AND PELVIS WITH CONTRAST TECHNIQUE: Multidetector CT imaging of the abdomen and pelvis was performed using the standard protocol following bolus administration of intravenous contrast. RADIATION DOSE REDUCTION: This exam was performed according to the departmental dose-optimization program which includes automated exposure control, adjustment of the mA and/or kV according to patient size and/or use of iterative reconstruction technique. CONTRAST:  36mL OMNIPAQUE IOHEXOL 300 MG/ML  SOLN COMPARISON:  CT Abdomen and Pelvis 03/14/2021 and earlier. FINDINGS: Lower chest: Left lower lobe atelectasis seen last month has resolved. Prior sternotomy. No pericardial or pleural effusion. Stable borderline cardiomegaly. Hepatobiliary: Stable liver since last year. Several small benign hepatic hemangiomas, most apparent at the liver dome on series 2, image 9. Questionable mild gallbladder wall thickening, although ascites is present and the gallbladder does not appear distended. No bile duct enlargement. Pancreas: Negative. Spleen: Stable and negative. Adrenals/Urinary Tract: Adrenal glands remain within normal limits. Unchanged appearance of both kidneys since last year, partial renal atrophy and chronic hydronephrosis on the right. No renal contrast excretion on delayed images. Mildly distended urinary bladder. Stomach/Bowel: Peritoneal dialysis catheter has been removed since last month. But there is increased free fluid in the peritoneal cavity, with simple fluid density (series 2 images 40 and 44.  No superimposed pneumoperitoneum. Low-density retained stool throughout redundant distal large bowel. Low-density stool and gas throughout the transverse and descending colon. Fluid in the right colon. No convincing large bowel inflammation. Evidence of normal gas containing retrocecal appendix on coronal image 53. Fluid containing small bowel in the lower abdomen and pelvis. Thick-walled proximal jejunum as on series 2, image 47. No transition point identified. Gas containing decompressed small bowel loops in the ventral abdomen. No definite pneumatosis. Fluid-filled distal stomach.  Similar fluid-filled duodenum. Small mesenteric ventral abdominal hernia again suspected on series 2, image 38. No herniated bowel. Vascular/Lymphatic: Advanced Aortoiliac calcified atherosclerosis. Tortuous abdominal aorta. Major arterial structures in the abdomen and pelvis remain patent despite the atherosclerosis. Portal venous system is patent. No lymphadenopathy identified. Reproductive: Negative. Other: No pelvic free fluid. Musculoskeletal: Prior sternotomy. Moderate thoracolumbar scoliosis. Mild lower lumbar spondylolisthesis with disc and facet degeneration. Previous instrumentation of the proximal right femur. Stable visualized osseous structures. No acute or suspicious osseous lesion identified. IMPRESSION: 1. Peritoneal dialysis catheter has been removed since last month, but peritoneal Ascites has increased since that time. This is nonspecific but consider peritonitis in the setting of abdominal pain and hypotension. 2. Thick-walled proximal small bowel loops, nonspecific. No transition point or convincing bowel obstruction. Infectious enteritis remains possible. 3. Normal appendix. Retained low-density stool throughout the distal colon. No convincing large bowel inflammation. 4. Otherwise stable CT appearance of the abdomen and pelvis. Advanced Aortic Atherosclerosis (ICD10-I70.0). Electronically Signed   By: Genevie Ann  M.D.   On: 04/08/2021 05:21   DG Chest Port 1 View  Result Date: 04/08/2021 CLINICAL DATA:  Abdominal pain EXAM: PORTABLE CHEST 1 VIEW COMPARISON:  03/14/2021 FINDINGS: The heart size and mediastinal contours are within normal limits. Both lungs are clear. The visualized skeletal structures are unremarkable. Remote median sternotomy. Right IJ approach central venous catheter tip at the cavoatrial junction. IMPRESSION: No active disease. Electronically Signed  By: Ulyses Jarred M.D.   On: 04/08/2021 03:41     PROCEDURES:  Critical Care performed: No  .1-3 Lead EKG Interpretation Performed by: Paulette Blanch, MD Authorized by: Paulette Blanch, MD     Interpretation: normal     ECG rate:  80   ECG rate assessment: normal     Rhythm: sinus rhythm     Ectopy: none     Conduction: normal   Comments:     Patient is on cardiac monitor to evaluate for arrhythmias   MEDICATIONS ORDERED IN ED: Medications  piperacillin-tazobactam (ZOSYN) IVPB 3.375 g (has no administration in time range)  morphine (PF) 4 MG/ML injection 4 mg (4 mg Intravenous Given 04/08/21 0356)  ondansetron (ZOFRAN) injection 4 mg (4 mg Intravenous Given 04/08/21 0356)  calcium gluconate 1 g/ 50 mL sodium chloride IVPB (0 mg Intravenous Stopped 04/08/21 0500)  iohexol (OMNIPAQUE) 300 MG/ML solution 75 mL (75 mLs Intravenous Contrast Given 04/08/21 0421)  morphine (PF) 4 MG/ML injection 4 mg (4 mg Intravenous Given 04/08/21 0543)     IMPRESSION / MDM / ASSESSMENT AND PLAN / ED COURSE  I reviewed the triage vital signs and the nursing notes.                             66 year old male presenting with abdominal pain status post PD catheter removal. Differential diagnosis includes, but is not limited to, acute appendicitis, renal colic, testicular torsion, urinary tract infection/pyelonephritis, prostatitis,  epididymitis, diverticulitis, small bowel obstruction or ileus, colitis, abdominal aortic aneurysm, gastroenteritis,  hernia, etc. I have personally reviewed patient's records and reviewed his op note from 03/20/2021  The patient is on the cardiac monitor to evaluate for evidence of arrhythmia and/or significant heart rate changes.  Laboratory results demonstrate stable anemia H/H 8.7/29.8, mild hypokalemia potassium 2.9, elevated creatinine consistent with ESRD, hypocalcemia with calcium 5.9.  Patient tells me he does not regularly take his calcium carbonate because he often forgets it and has not had it in several days.  Will obtain CT abdomen pelvis, replete calcium, administer IV morphine for pain paired with IV Zofran for nausea.  Will reassess.  Clinical Course as of 04/08/21 0550  Wed Apr 08, 2021  0549 Patient reports pain is returning.  Updated him of CT imaging results and my concern for SBP.  Will administer IV Zosyn.  Will redose IV morphine for pain and consult hospitalist services for evaluation and admission. [JS]    Clinical Course User Index [JS] Paulette Blanch, MD     FINAL CLINICAL IMPRESSION(S) / ED DIAGNOSES   Final diagnoses:  Abdominal pain, unspecified abdominal location  Hypocalcemia  Hypokalemia  SBP (spontaneous bacterial peritonitis) ([place])     Rx / DC Orders   ED Discharge Orders     None        Note:  This document was prepared using Dragon voice recognition software and may include unintentional dictation errors.   Paulette Blanch, MD 04/08/21 [phone]

## 2021-04-08 NOTE — Progress Notes (Signed)
Patient arrived from the ED

## 2021-04-08 NOTE — Progress Notes (Signed)
Pharmacy Antibiotic Note  Anthony Hess is a 66 y.o. male admitted on 04/08/2021 with  peritonitis .  Pharmacy has been consulted for Vancomycin dosing.  -currently ordered Ceftriaxone -covid+, remdesivir  Plan: Will order Loading dose of 1250mg  IV Vancomycin x1  Will continue with Vancomycin 500mg  IV with each dialysis (ordered Q T/Th/Sat based on H&P)  -f/u hemodialysis schedule, cultures    Weight: 57.3 kg (126 lb 5.2 oz)  Temp (24hrs), Avg:99.5 F (37.5 C), Min:99.5 F (37.5 C), Max:99.5 F (37.5 C)  Recent Labs  Lab 04/07/21 2258 04/08/21 0545  WBC 5.1  --   CREATININE 4.41*  --   LATICACIDVEN  --  1.3    Estimated Creatinine Clearance: 13.8 mL/min (A) (by C-G formula based on SCr of 4.41 mg/dL (H)).    No Known Allergies  Antimicrobials this admission: CTX 2/15 >> Vanc 2/15 >> Remdesivir 2/15>>  Dose adjustments this admission:    Microbiology results: 2/15 BCx: pending   UCx:      Sputum:      MRSA PCR:   Covid +  Thank you for allowing pharmacy to be a part of this patients care.  Dyamon Sosinski A 04/08/2021 9:44 AM

## 2021-04-09 DIAGNOSIS — N186 End stage renal disease: Secondary | ICD-10-CM

## 2021-04-09 DIAGNOSIS — R109 Unspecified abdominal pain: Secondary | ICD-10-CM | POA: Diagnosis not present

## 2021-04-09 DIAGNOSIS — T8571XA Infection and inflammatory reaction due to peritoneal dialysis catheter, initial encounter: Secondary | ICD-10-CM | POA: Diagnosis not present

## 2021-04-09 DIAGNOSIS — D631 Anemia in chronic kidney disease: Secondary | ICD-10-CM

## 2021-04-09 DIAGNOSIS — U071 COVID-19: Secondary | ICD-10-CM | POA: Diagnosis not present

## 2021-04-09 LAB — BASIC METABOLIC PANEL
Anion gap: 13 (ref 5–15)
BUN: 41 mg/dL — ABNORMAL HIGH (ref 8–23)
CO2: 22 mmol/L (ref 22–32)
Calcium: 5.7 mg/dL — CL (ref 8.9–10.3)
Chloride: 104 mmol/L (ref 98–111)
Creatinine, Ser: 6.3 mg/dL — ABNORMAL HIGH (ref 0.61–1.24)
GFR, Estimated: 9 mL/min — ABNORMAL LOW (ref >60)
Glucose, Bld: 72 mg/dL (ref 70–99)
Potassium: 4.4 mmol/L (ref 3.5–5.1)
Sodium: 139 mmol/L (ref 135–145)

## 2021-04-09 LAB — HEPATIC FUNCTION PANEL
ALT: 47 U/L — ABNORMAL HIGH (ref 0–44)
AST: 31 U/L (ref 15–41)
Albumin: 2 g/dL — ABNORMAL LOW (ref 3.5–5.0)
Alkaline Phosphatase: 49 U/L (ref 38–126)
Bilirubin, Direct: <0.1 mg/dL (ref 0.0–0.2)
Total Bilirubin: 0.5 mg/dL (ref 0.3–1.2)
Total Protein: 4.8 g/dL — ABNORMAL LOW (ref 6.5–8.1)

## 2021-04-09 LAB — CBC
HCT: 29.7 % — ABNORMAL LOW (ref 39.0–52.0)
Hemoglobin: 8.7 g/dL — ABNORMAL LOW (ref 13.0–17.0)
MCH: 26.5 pg (ref 26.0–34.0)
MCHC: 29.3 g/dL — ABNORMAL LOW (ref 30.0–36.0)
MCV: 90.5 fL (ref 80.0–100.0)
Platelets: 183 10*3/uL (ref 150–400)
RBC: 3.28 MIL/uL — ABNORMAL LOW (ref 4.22–5.81)
RDW: 17.6 % — ABNORMAL HIGH (ref 11.5–15.5)
WBC: 6.3 10*3/uL (ref 4.0–10.5)
nRBC: 0 % (ref 0.0–0.2)

## 2021-04-09 LAB — MAGNESIUM: Magnesium: 2 mg/dL (ref 1.7–2.4)

## 2021-04-09 MED ORDER — CALCIUM GLUCONATE-NACL 2-0.675 GM/100ML-% IV SOLN
2.0000 g | Freq: Once | INTRAVENOUS | Status: AC
  Administered 2021-04-09: 2000 mg via INTRAVENOUS
  Filled 2021-04-09: qty 100

## 2021-04-09 MED ORDER — HEPARIN SODIUM (PORCINE) 1000 UNIT/ML IJ SOLN
INTRAMUSCULAR | Status: AC
  Filled 2021-04-09: qty 10

## 2021-04-09 MED ORDER — SODIUM CHLORIDE 0.9 % IV SOLN: INTRAVENOUS | Status: DC | PRN

## 2021-04-09 NOTE — Progress Notes (Signed)
Hemodialysis patient known at [place] 9:45am. Patient asked for Medicaid transportation number, I looked this up and supplied it to him. He stated that he need to contact them to inform that he was admitted. Patient stated that Medicaid pays for [facility] for transportation to and from dialysis. I informed patient that his chair time will not change, however, he will require to wear a mask while in clinic at all times. Patient stated he was aware of this. Patient stated no other dialysis concerns.

## 2021-04-09 NOTE — Progress Notes (Signed)
Patient transported to HD via bed

## 2021-04-09 NOTE — TOC Initial Note (Addendum)
Transition of Care [facility]) - Initial/Assessment Note    Patient Details  Name: Anthony Hess MRN: [id number] Date of Birth: 06-26-1955  Transition of Care [facility]) CM/SW Contact:    Beverly Sessions, RN Phone Number: 04/09/2021, 1:33 PM  Clinical Narrative:                    Patient with high risk of readmission score.  Assessed by toc 03/16/20 see note from that time below  "Admitted for: sepsis Admitted from: Home with wife  PCP: No PCP.  Patient states he is agreeable to new patient appointment and does not have a preference of practice.  New patient appointment made for 2/6 and added to AVS Pharmacy: [facility]  Current home health/prior home health/DME:No DME,  independent per patient "  Elvera Bicker dialysis liaison notified of admission.        Patient Goals and CMS Choice        Expected Discharge Plan and Services                                                Prior Living Arrangements/Services                       Activities of Daily Living Home Assistive Devices/Equipment: None ADL Screening (condition at time of admission) Patient's cognitive ability adequate to safely complete daily activities?: Yes Is the patient deaf or have difficulty hearing?: No Does the patient have difficulty seeing, even when wearing glasses/contacts?: No Does the patient have difficulty concentrating, remembering, or making decisions?: No Patient able to express need for assistance with ADLs?: Yes Does the patient have difficulty dressing or bathing?: No Independently performs ADLs?: Yes (appropriate for developmental age) Does the patient have difficulty walking or climbing stairs?: No Weakness of Legs: None Weakness of Arms/Hands: None  Permission Sought/Granted                  Emotional Assessment              Admission diagnosis:  Hypocalcemia [E83.51] Hypokalemia [E87.6] SBP (spontaneous bacterial peritonitis) ([place]) [K65.2] Peritonitis  associated with peritoneal dialysis ([place]) [T85.71XA] Abdominal pain, unspecified abdominal location [R10.9] Patient Active Problem List   Diagnosis Date Noted   SBP (spontaneous bacterial peritonitis) ([place]) 04/08/2021   Peritonitis associated with peritoneal dialysis ([place]) 04/08/2021   Hypokalemia 04/08/2021   COVID-19 virus infection 04/08/2021   AAA (abdominal aortic aneurysm) 03/18/2021   Sepsis ([place]) 03/14/2021   Hypocalcemia    Leukocytosis 03/05/2021   Spontaneous bacterial peritonitis ([place]) 03/04/2021   Incisional hernia, without obstruction or gangrene    PD catheter dysfunction ([place]) 04/13/2020   Chronic kidney disease due to hypertension 01/30/2020   Hyperparathyroidism due to renal insufficiency ([place]) 01/30/2020   Acute peritonitis ([place]) 01/08/2020   Hypotension 01/04/2020   Anemia in ESRD (end-stage renal disease) ([place]) 01/04/2020   Hyperlipidemia 01/04/2020   Mass of left side of neck 01/04/2020   Senile purpura ([place]) 01/04/2020   Chronic HFrEF (heart failure with reduced ejection fraction) ([place])    Hydroureteronephrosis    Atrial fibrillation ([place]) 01/05/2019   ESRD (end stage renal disease) ([place]) 01/05/2019   Anemia in chronic kidney disease ([place]) 01/05/2019   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   Presence of aortocoronary bypass graft 12/27/2018   Emphysema lung ([place])  04/14/2018   Bilateral hydronephrosis 04/03/2018   Hypertension 03/27/2018   Cigarette smoker 03/20/2018   History of bladder cancer 03/20/2018   PCP:  Pcp, No Pharmacy:   [place], [address] [place] [address] Phone: [phone] Fax: [phone]  [place], [place] [address] [place] [address] Phone: [phone] Fax: [phone]     Social Determinants of Health (SDOH) Interventions    Readmission Risk Interventions Readmission Risk Prevention Plan 04/09/2021 03/16/2021  01/09/2020  Transportation Screening Complete Complete Complete  PCP or Specialist Appt within 3-5 Days - - Complete  Social Work Consult for [place] Planning/Counseling - Complete Complete  Palliative Care Screening Not Applicable Not Applicable Not Applicable  Medication Review Press photographer) Complete Complete Complete  Some recent data might be hidden

## 2021-04-09 NOTE — Progress Notes (Signed)
Urine specimen collection is not obtained due to patient not making urine thus far. Urine collection hat is placed in the bathroom and patient is educated to use the collection chamber and call the nurse.

## 2021-04-09 NOTE — Progress Notes (Signed)
[facility]  ROUNDING NOTE   Subjective:   Anthony Hess is a 65 year old male with past medical conditions including bladder cancer, CAD, hypertension, and end-stage renal disease on hemodialysis.  Patient presents to the emergency department with complaints of abdominal pain.  Patient has been admitted for Hypocalcemia [E83.51] Hypokalemia [E87.6] SBP (spontaneous bacterial peritonitis) ([place]) [K65.2] Peritonitis associated with peritoneal dialysis ([place]) [T85.71XA] Abdominal pain, unspecified abdominal location [R10.9]  Patient is known to our practice and receives outpatient dialysis treatments at [facility] on a TTS schedule, supervised by Dr. Holley Raring.  Patient received half of dialysis treatment prior to terminated early due to increased abdominal pain.    Patient seen resting quietly, alert and oriented Continues to complain of abdominal pain but states is well managed with prescribed medications. Currently tolerating meals  Plan to dialyze later today   Objective:  Vital signs in last 24 hours:  Temp:  [98.2 F (36.8 C)-98.6 F (37 C)] 98.6 F (37 C) (02/16 0655) Pulse Rate:  [69-79] 77 (02/16 0852) Resp:  [14-18] 18 (02/16 0655) BP: (116-163)/(91-116) 116/91 (02/16 0852) SpO2:  [98 %-100 %] 100 % (02/16 0655) Weight:  [52.9 kg] 52.9 kg (02/15 1443)  Weight change:  Filed Weights   04/08/21 0900 04/08/21 1443  Weight: 57.3 kg 52.9 kg    Intake/Output: I/O last 3 completed shifts: In: 570.5 [P.O.:480; IV Piggyback:90.5] Out: 0    Intake/Output this shift:  No intake/output data recorded.  Physical Exam: General: NAD, resting in bed  Head: Normocephalic, atraumatic. Moist oral mucosal membranes  Eyes: Anicteric  Lungs:  Clear to auscultation, normal effort  Heart: Regular rate and rhythm  Abdomen:  Soft, tender, mild distention  Extremities: No peripheral edema.  Neurologic: Nonfocal, moving all four extremities  Skin: No lesions  Access:  Right IJ PermCath    Basic Metabolic Panel: Recent Labs  Lab 04/07/21 2258 04/09/21 0352  NA 139 139  K 2.9* 4.4  CL 104 104  CO2 27 22  GLUCOSE 99 72  BUN 23 41*  CREATININE 4.41* 6.30*  CALCIUM 5.9* 5.7*  MG  --  2.0     Liver Function Tests: Recent Labs  Lab 04/07/21 2258 04/09/21 0352  AST 50* 31  ALT 70* 47*  ALKPHOS 57 49  BILITOT 0.4 0.5  PROT 5.4* 4.8*  ALBUMIN 2.3* 2.0*    Recent Labs  Lab 04/07/21 2258  LIPASE 47    No results for input(s): AMMONIA in the last 168 hours.  CBC: Recent Labs  Lab 04/07/21 2258 04/09/21 0352  WBC 5.1 6.3  HGB 8.7* 8.7*  HCT 29.8* 29.7*  MCV 90.0 90.5  PLT 177 183     Cardiac Enzymes: No results for input(s): CKTOTAL, CKMB, CKMBINDEX, TROPONINI in the last 168 hours.  BNP: Invalid input(s): POCBNP  CBG: No results for input(s): GLUCAP in the last 168 hours.  Microbiology: Results for orders placed or performed during the hospital encounter of 04/08/21  Culture, blood (routine x 2)     Status: None (Preliminary result)   Collection Time: 04/08/21  5:45 AM   Specimen: BLOOD  Result Value Ref Range Status   Specimen Description BLOOD LEFT HAND  Final   Special Requests   Final    BOTTLES DRAWN AEROBIC AND ANAEROBIC Blood Culture results may not be optimal due to an excessive volume of blood received in culture bottles   Culture   Final    NO GROWTH 1 DAY Performed at [facility], [address] [place], [place] [address]    Report Status PENDING  Incomplete  Resp Panel by RT-PCR (Flu A&B, Covid) Nasopharyngeal Swab     Status: Abnormal   Collection Time: 04/08/21  5:45 AM   Specimen: Nasopharyngeal Swab; Nasopharyngeal(NP) swabs in vial transport medium  Result Value Ref Range Status   SARS Coronavirus 2 by RT PCR POSITIVE (A) NEGATIVE Final    Comment: (NOTE) SARS-CoV-2 target nucleic acids are DETECTED.  The SARS-CoV-2 RNA is generally detectable in upper respiratory specimens  during the acute phase of infection. Positive results are indicative of the presence of the identified virus, but do not rule out bacterial infection or co-infection with other pathogens not detected by the test. Clinical correlation with patient history and other diagnostic information is necessary to determine patient infection status. The expected result is Negative.  Fact Sheet for Patients: EntrepreneurPulse.com.au  Fact Sheet for Healthcare Providers: IncredibleEmployment.be  This test is not yet approved or cleared by the [place] FDA and  has been authorized for detection and/or diagnosis of SARS-CoV-2 by FDA under an Emergency Use Authorization (EUA).  This EUA will remain in effect (meaning this test can be used) for the duration of  the COVID-19 declaration under Section 564(b)(1) of the A ct, 21 U.S.C. section 360bbb-3(b)(1), unless the authorization is terminated or revoked sooner.     Influenza A by PCR NEGATIVE NEGATIVE Final   Influenza B by PCR NEGATIVE NEGATIVE Final    Comment: (NOTE) The Xpert Xpress SARS-CoV-2/FLU/RSV plus assay is intended as an aid in the diagnosis of influenza from Nasopharyngeal swab specimens and should not be used as a sole basis for treatment. Nasal washings and aspirates are unacceptable for Xpert Xpress SARS-CoV-2/FLU/RSV testing.  Fact Sheet for Patients: EntrepreneurPulse.com.au  Fact Sheet for Healthcare Providers: IncredibleEmployment.be  This test is not yet approved or cleared by the [place] FDA and has been authorized for detection and/or diagnosis of SARS-CoV-2 by FDA under an Emergency Use Authorization (EUA). This EUA will remain in effect (meaning this test can be used) for the duration of the COVID-19 declaration under Section 564(b)(1) of the Act, 21 U.S.C. section 360bbb-3(b)(1), unless the authorization is terminated  or revoked.  Performed at [facility], [address] [place], [place] [address]   Culture, blood (routine x 2)     Status: None (Preliminary result)   Collection Time: 04/08/21  5:46 AM   Specimen: BLOOD  Result Value Ref Range Status   Specimen Description BLOOD RIGHT HAND  Final   Special Requests   Final    BOTTLES DRAWN AEROBIC AND ANAEROBIC Blood Culture adequate volume   Culture   Final    NO GROWTH 1 DAY Performed at [facility], [address] [place], [place] [address]    Report Status PENDING  Incomplete  MRSA Next Gen by PCR, Nasal     Status: None   Collection Time: 04/08/21  3:16 PM   Specimen: Nasal Mucosa; Nasal Swab  Result Value Ref Range Status   MRSA by PCR Next Gen NOT DETECTED NOT DETECTED Final    Comment: (NOTE) The GeneXpert MRSA Assay (FDA approved for NASAL specimens only), is one component of a comprehensive MRSA colonization surveillance program. It is not intended to diagnose MRSA infection nor to guide or monitor treatment for MRSA infections. Test performance is not FDA approved in patients less than 53 years old. Performed at [facility], [address] [place],  [place] [address]     Coagulation Studies: No results for input(s): LABPROT, INR in the last 72 hours.  Urinalysis: No results for input(s): COLORURINE, LABSPEC, PHURINE, GLUCOSEU, HGBUR, BILIRUBINUR, KETONESUR, PROTEINUR, UROBILINOGEN, NITRITE, LEUKOCYTESUR in the last 72 hours.  Invalid input(s): APPERANCEUR    Imaging: CT Abdomen Pelvis W Contrast  Result Date: 04/08/2021 CLINICAL DATA:  66 year old male dialysis patient with left lower quadrant abdominal pain since tube removed last week. On antibiotics. Hypotension. EXAM: CT ABDOMEN AND PELVIS WITH CONTRAST TECHNIQUE: Multidetector CT imaging of the abdomen and pelvis was performed using the standard protocol following bolus administration of intravenous contrast. RADIATION DOSE REDUCTION:  This exam was performed according to the departmental dose-optimization program which includes automated exposure control, adjustment of the mA and/or kV according to patient size and/or use of iterative reconstruction technique. CONTRAST:  88mL OMNIPAQUE IOHEXOL 300 MG/ML  SOLN COMPARISON:  CT Abdomen and Pelvis 03/14/2021 and earlier. FINDINGS: Lower chest: Left lower lobe atelectasis seen last month has resolved. Prior sternotomy. No pericardial or pleural effusion. Stable borderline cardiomegaly. Hepatobiliary: Stable liver since last year. Several small benign hepatic hemangiomas, most apparent at the liver dome on series 2, image 9. Questionable mild gallbladder wall thickening, although ascites is present and the gallbladder does not appear distended. No bile duct enlargement. Pancreas: Negative. Spleen: Stable and negative. Adrenals/Urinary Tract: Adrenal glands remain within normal limits. Unchanged appearance of both kidneys since last year, partial renal atrophy and chronic hydronephrosis on the right. No renal contrast excretion on delayed images. Mildly distended urinary bladder. Stomach/Bowel: Peritoneal dialysis catheter has been removed since last month. But there is increased free fluid in the peritoneal cavity, with simple fluid density (series 2 images 40 and 44. No superimposed pneumoperitoneum. Low-density retained stool throughout redundant distal large bowel. Low-density stool and gas throughout the transverse and descending colon. Fluid in the right colon. No convincing large bowel inflammation. Evidence of normal gas containing retrocecal appendix on coronal image 53. Fluid containing small bowel in the lower abdomen and pelvis. Thick-walled proximal jejunum as on series 2, image 47. No transition point identified. Gas containing decompressed small bowel loops in the ventral abdomen. No definite pneumatosis. Fluid-filled distal stomach.  Similar fluid-filled duodenum. Small mesenteric  ventral abdominal hernia again suspected on series 2, image 38. No herniated bowel. Vascular/Lymphatic: Advanced Aortoiliac calcified atherosclerosis. Tortuous abdominal aorta. Major arterial structures in the abdomen and pelvis remain patent despite the atherosclerosis. Portal venous system is patent. No lymphadenopathy identified. Reproductive: Negative. Other: No pelvic free fluid. Musculoskeletal: Prior sternotomy. Moderate thoracolumbar scoliosis. Mild lower lumbar spondylolisthesis with disc and facet degeneration. Previous instrumentation of the proximal right femur. Stable visualized osseous structures. No acute or suspicious osseous lesion identified. IMPRESSION: 1. Peritoneal dialysis catheter has been removed since last month, but peritoneal Ascites has increased since that time. This is nonspecific but consider peritonitis in the setting of abdominal pain and hypotension. 2. Thick-walled proximal small bowel loops, nonspecific. No transition point or convincing bowel obstruction. Infectious enteritis remains possible. 3. Normal appendix. Retained low-density stool throughout the distal colon. No convincing large bowel inflammation. 4. Otherwise stable CT appearance of the abdomen and pelvis. Advanced Aortic Atherosclerosis (ICD10-I70.0). Electronically Signed   By: Genevie Ann M.D.   On: 04/08/2021 05:21   DG Chest Port 1 View  Result Date: 04/08/2021 CLINICAL DATA:  Abdominal pain EXAM: PORTABLE CHEST 1 VIEW COMPARISON:  03/14/2021 FINDINGS: The heart size and mediastinal contours are within normal limits. Both lungs are clear. The  visualized skeletal structures are unremarkable. Remote median sternotomy. Right IJ approach central venous catheter tip at the cavoatrial junction. IMPRESSION: No active disease. Electronically Signed   By: Ulyses Jarred M.D.   On: 04/08/2021 03:41     Medications:    sodium chloride     calcium gluconate     cefTRIAXone (ROCEPHIN)  IV Stopped (04/08/21 1406)    remdesivir 100 mg in NS 100 mL 100 mg (04/09/21 0901)   vancomycin      albuterol  2 puff Inhalation Q6H   vitamin C  500 mg Oral Daily   aspirin EC  81 mg Oral Daily   atorvastatin  40 mg Oral Daily   calcitRIOL  0.5 mcg Oral Daily   calcium carbonate  1,250 mg Oral BID WC   Chlorhexidine Gluconate Cloth  6 each Topical Daily   feeding supplement (NEPRO CARB STEADY)  237 mL Oral TID BM   heparin injection (subcutaneous)  5,000 Units Subcutaneous Q8H   hydrALAZINE  10 mg Oral Q8H   irbesartan  150 mg Oral Daily   magnesium oxide  400 mg Oral Daily   metoprolol succinate  50 mg Oral Daily   sucroferric oxyhydroxide  1,000 mg Oral TID   zinc sulfate  220 mg Oral Daily   sodium chloride, acetaminophen, guaiFENesin-dextromethorphan, morphine injection, ondansetron **OR** ondansetron (ZOFRAN) IV  Assessment/ Plan:  [name] is a 67 y.o.  male with past medical conditions including bladder cancer, CAD, hypertension, and end-stage renal disease on hemodialysis.  Patient presents to the emergency department with complaints of abdominal pain.  Patient has been admitted for Hypocalcemia [E83.51] Hypokalemia [E87.6] SBP (spontaneous bacterial peritonitis) ([place]) [K65.2] Peritonitis associated with peritoneal dialysis ([place]) [T85.71XA] Abdominal pain, unspecified abdominal location [R10.9]  [facility]/TTS/ Rt Permcath  End-stage renal disease on hemodialysis.  Will maintain outpatient schedule if possible.  Will provide scheduled dialysis treatment this afternoon.  UF goal 1 L as tolerated.  2. Anemia of chronic kidney disease Normocytic  Lab Results  Component Value Date   HGB 8.7 (L) 04/09/2021    Hemoglobin below desired target, Micera ordered outpatient We will continue to monitor  3. Secondary Hyperparathyroidism:  Lab Results  Component Value Date   PTH 159 (H) 12/27/2019   CALCIUM 5.7 (LL) 04/09/2021   CAION 0.74 (LL) 01/20/2021   PHOS 3.8 03/24/2021  Corrected  calcium of 7.3.  Will transfuse one-time dose of calcium gluconate 2 g.  We will dialyze patient today with 3 calcium. Vitamin D and Velphoro ordered outpatient.  These have been continued inpatient along with calcium carbonate twice daily  4.  Hypertension with chronic kidney disease.  Outpatient regimen includes hydralazine, irbesartan, and metoprolol.  Hydralazine currently held at admission.  All currently prescribed at this time.  BP elevated this morning at 163/116.  5.  Peritonitis associated with peritoneal dialysis.  Empirically placed on vancomycin and Rocephin. . 6.  COVID-19 positive Positive finding on 04/08/2021.  Currently receiving remdesivir and supportive care therapies.   LOS: 1 [place] 2/16/202311:34 AM

## 2021-04-09 NOTE — Progress Notes (Signed)
PROGRESS NOTE  Anthony Hess    DOB: [birth year], 66 y.o.  DQQ:[id number]    Code Status: Full Code   DOA: 04/08/2021   LOS: 1   Brief hospital course  Anthony Hess is a 66 y.o. male with a PMH significant for ESRD on PD previously and now HD TThS. They presented from home to the ED on 04/08/2021 with diffuse abdominal pain x 7 days. He had his non-functioning PD catheter removed about a week ago.  On another note, he has been receiving HD through a tunneled cath but his most recent session was shortened due to hypotension. In the ED, it was found that they had mild ascites, peritonitis on CT scan. Signs of enteritis also present.  They were treated with IV Abx and supportive care. Nephrology was consulted for routine HD.  Additionally, patient was found to be COVID-19 positive.  Patient was admitted to medicine service for further workup and management of peritonitis as outlined in detail below.  04/09/21 -stable, improved  Assessment & Plan  Principal Problem:   Peritonitis associated with peritoneal dialysis ([place]) Active Problems:   Anemia in ESRD (end-stage renal disease) ([place])   Hypocalcemia   Hypokalemia   COVID-19 virus infection  Peritonitis- likely related to recent PD catheter removed prior to admission.  - continue antibiotics - consider diagnostic paracentesis  Hypocalcemia- corrects to 7.3 with significant hypoalbuminemia - continue to monitor  Covid-19 - continue remdesivir - supportive care  ESRD on HD TThS - management per nephrology  Body mass index is 20.67 kg/m.  VTE ppx: heparin injection 5,000 Units Start: 04/08/21 0945   Diet:     Diet   Diet renal with fluid restriction Fluid restriction: 1200 mL Fluid; Room service appropriate? Yes; Fluid consistency: Thin   Subjective 04/09/21    Pt reports feeling improved. He has improved abdominal pain but still present. Denies N/V.   Objective   Vitals:   04/08/21 1443 04/08/21 1657 04/08/21 1854  04/08/21 2004  BP:  (!) 160/106 (!) 141/98 (!) 138/94  Pulse:   79 77  Resp:    17  Temp:    98.2 F (36.8 C)  TempSrc:    Oral  SpO2:    98%  Weight: 52.9 kg     Height: 5\' 3"  (1.6 m)       Intake/Output Summary (Last 24 hours) at 04/09/2021 0734 Last data filed at 04/09/2021 0400 Gross per 24 hour  Intake 480 ml  Output 0 ml  Net 480 ml   Filed Weights   04/08/21 0900 04/08/21 1443  Weight: 57.3 kg 52.9 kg     Physical Exam:  General: awake, alert, NAD HEENT: atraumatic, clear conjunctiva, anicteric sclera, MMM, hearing grossly normal Respiratory: normal respiratory effort. Cardiovascular: normal S1/S2, RRR, no JVD, murmurs, quick capillary refill  Gastrointestinal: soft, enlarged, mildly tender generalized Nervous: A&O x3. no gross focal neurologic deficits, normal speech Extremities: moves all equally, no edema, normal tone Skin: dry, intact, normal temperature, normal color. No rashes, lesions or ulcers on exposed skin Psychiatry: normal mood, congruent affect  Labs   I have personally reviewed the following labs and imaging studies CBC    Component Value Date/Time   WBC 6.3 04/09/2021 0352   RBC 3.28 (L) 04/09/2021 0352   HGB 8.7 (L) 04/09/2021 0352   HGB 9.1 (L) 01/04/2020 1540   HCT 29.7 (L) 04/09/2021 0352   HCT 29.6 (L) 01/04/2020 1540   PLT 183 04/09/2021 0352   PLT 191  01/04/2020 1540   MCV 90.5 04/09/2021 0352   MCV 94 01/04/2020 1540   MCH 26.5 04/09/2021 0352   MCHC 29.3 (L) 04/09/2021 0352   RDW 17.6 (H) 04/09/2021 0352   RDW 16.2 (H) 01/04/2020 1540   LYMPHSABS 0.9 03/14/2021 2112   LYMPHSABS 1.0 01/04/2020 1540   MONOABS 1.4 (H) 03/14/2021 2112   EOSABS 0.2 03/14/2021 2112   EOSABS 0.5 (H) 03/27/2018 1643   BASOSABS 0.0 03/14/2021 2112   BASOSABS 0.0 03/27/2018 1643   BMP Latest Ref Rng & Units 04/09/2021 04/07/2021 03/24/2021  Glucose 70 - 99 mg/dL 72 99 89  BUN 8 - 23 mg/dL 41(H) 23 48(H)  Creatinine 0.61 - 1.24 mg/dL 6.30(H) 4.41(H)  5.93(H)  BUN/Creat Ratio 10 - 24 - - -  Sodium 135 - 145 mmol/L 139 139 141  Potassium 3.5 - 5.1 mmol/L 4.4 2.9(L) 4.1  Chloride 98 - 111 mmol/L 104 104 105  CO2 22 - 32 mmol/L 22 27 26   Calcium 8.9 - 10.3 mg/dL 5.7(LL) 5.9(LL) 7.6(L)    CT Abdomen Pelvis W Contrast  Result Date: 04/08/2021 CLINICAL DATA:  66 year old male dialysis patient with left lower quadrant abdominal pain since tube removed last week. On antibiotics. Hypotension. EXAM: CT ABDOMEN AND PELVIS WITH CONTRAST TECHNIQUE: Multidetector CT imaging of the abdomen and pelvis was performed using the standard protocol following bolus administration of intravenous contrast. RADIATION DOSE REDUCTION: This exam was performed according to the departmental dose-optimization program which includes automated exposure control, adjustment of the mA and/or kV according to patient size and/or use of iterative reconstruction technique. CONTRAST:  65mL OMNIPAQUE IOHEXOL 300 MG/ML  SOLN COMPARISON:  CT Abdomen and Pelvis 03/14/2021 and earlier. FINDINGS: Lower chest: Left lower lobe atelectasis seen last month has resolved. Prior sternotomy. No pericardial or pleural effusion. Stable borderline cardiomegaly. Hepatobiliary: Stable liver since last year. Several small benign hepatic hemangiomas, most apparent at the liver dome on series 2, image 9. Questionable mild gallbladder wall thickening, although ascites is present and the gallbladder does not appear distended. No bile duct enlargement. Pancreas: Negative. Spleen: Stable and negative. Adrenals/Urinary Tract: Adrenal glands remain within normal limits. Unchanged appearance of both kidneys since last year, partial renal atrophy and chronic hydronephrosis on the right. No renal contrast excretion on delayed images. Mildly distended urinary bladder. Stomach/Bowel: Peritoneal dialysis catheter has been removed since last month. But there is increased free fluid in the peritoneal cavity, with simple fluid  density (series 2 images 40 and 44. No superimposed pneumoperitoneum. Low-density retained stool throughout redundant distal large bowel. Low-density stool and gas throughout the transverse and descending colon. Fluid in the right colon. No convincing large bowel inflammation. Evidence of normal gas containing retrocecal appendix on coronal image 53. Fluid containing small bowel in the lower abdomen and pelvis. Thick-walled proximal jejunum as on series 2, image 47. No transition point identified. Gas containing decompressed small bowel loops in the ventral abdomen. No definite pneumatosis. Fluid-filled distal stomach.  Similar fluid-filled duodenum. Small mesenteric ventral abdominal hernia again suspected on series 2, image 38. No herniated bowel. Vascular/Lymphatic: Advanced Aortoiliac calcified atherosclerosis. Tortuous abdominal aorta. Major arterial structures in the abdomen and pelvis remain patent despite the atherosclerosis. Portal venous system is patent. No lymphadenopathy identified. Reproductive: Negative. Other: No pelvic free fluid. Musculoskeletal: Prior sternotomy. Moderate thoracolumbar scoliosis. Mild lower lumbar spondylolisthesis with disc and facet degeneration. Previous instrumentation of the proximal right femur. Stable visualized osseous structures. No acute or suspicious osseous lesion identified. IMPRESSION: 1. Peritoneal  dialysis catheter has been removed since last month, but peritoneal Ascites has increased since that time. This is nonspecific but consider peritonitis in the setting of abdominal pain and hypotension. 2. Thick-walled proximal small bowel loops, nonspecific. No transition point or convincing bowel obstruction. Infectious enteritis remains possible. 3. Normal appendix. Retained low-density stool throughout the distal colon. No convincing large bowel inflammation. 4. Otherwise stable CT appearance of the abdomen and pelvis. Advanced Aortic Atherosclerosis (ICD10-I70.0).  Electronically Signed   By: Genevie Ann M.D.   On: 04/08/2021 05:21   DG Chest Port 1 View  Result Date: 04/08/2021 CLINICAL DATA:  Abdominal pain EXAM: PORTABLE CHEST 1 VIEW COMPARISON:  03/14/2021 FINDINGS: The heart size and mediastinal contours are within normal limits. Both lungs are clear. The visualized skeletal structures are unremarkable. Remote median sternotomy. Right IJ approach central venous catheter tip at the cavoatrial junction. IMPRESSION: No active disease. Electronically Signed   By: Ulyses Jarred M.D.   On: 04/08/2021 03:41    Disposition Plan & Communication  Patient status: Inpatient  Admitted From: Home Planned disposition location: Home Anticipated discharge date: TBD pending improvement in peritonitiis  Family Communication: none    Author: Richarda Osmond, DO [facility] 04/09/2021, 7:34 AM   Available by Epic secure chat 7AM-7PM. If 7PM-7AM, please contact night-coverage.  [facility] contact information found on [email or web address].

## 2021-04-09 NOTE — Progress Notes (Signed)
Lab reports critical calcium of 5.7. Provider is notified. New orders are received. New Lab orders: hepatic function and magnesium is added on

## 2021-04-10 ENCOUNTER — Other Ambulatory Visit (HOSPITAL_COMMUNITY): Payer: Self-pay

## 2021-04-10 DIAGNOSIS — R1084 Generalized abdominal pain: Secondary | ICD-10-CM

## 2021-04-10 DIAGNOSIS — N186 End stage renal disease: Secondary | ICD-10-CM | POA: Diagnosis not present

## 2021-04-10 DIAGNOSIS — T8571XD Infection and inflammatory reaction due to peritoneal dialysis catheter, subsequent encounter: Secondary | ICD-10-CM

## 2021-04-10 DIAGNOSIS — U071 COVID-19: Secondary | ICD-10-CM | POA: Diagnosis not present

## 2021-04-10 DIAGNOSIS — R109 Unspecified abdominal pain: Secondary | ICD-10-CM | POA: Diagnosis not present

## 2021-04-10 LAB — RENAL FUNCTION PANEL
Albumin: 2.1 g/dL — ABNORMAL LOW (ref 3.5–5.0)
Anion gap: 9 (ref 5–15)
BUN: 33 mg/dL — ABNORMAL HIGH (ref 8–23)
CO2: 28 mmol/L (ref 22–32)
Calcium: 6.9 mg/dL — ABNORMAL LOW (ref 8.9–10.3)
Chloride: 103 mmol/L (ref 98–111)
Creatinine, Ser: 5.36 mg/dL — ABNORMAL HIGH (ref 0.61–1.24)
GFR, Estimated: 11 mL/min — ABNORMAL LOW (ref >60)
Glucose, Bld: 100 mg/dL — ABNORMAL HIGH (ref 70–99)
Phosphorus: 6.4 mg/dL — ABNORMAL HIGH (ref 2.5–4.6)
Potassium: 4.5 mmol/L (ref 3.5–5.1)
Sodium: 140 mmol/L (ref 135–145)

## 2021-04-10 MED ORDER — SACUBITRIL-VALSARTAN 24-26 MG PO TABS
1.0000 | ORAL_TABLET | Freq: Two times a day (BID) | ORAL | Status: DC
  Administered 2021-04-10 – 2021-04-16 (×13): 1 via ORAL
  Filled 2021-04-10 (×14): qty 1

## 2021-04-10 NOTE — Consult Note (Addendum)
NAME: Anthony Hess  DOB: 06-29-1955  MRN: [id number]  Date/Time: 04/10/2021 10:43 PM  REQUESTING PROVIDER: Crista Elliot Subjective:  REASON FOR CONSULT: peritonitis ?This is the third time I am seeing the patient in the past 6 weeks  Daivd Fredericksen is a 66 y.o. with a history of end-stage renal disease was on peritoneal dialysis, hyperlipidemia, hypertension, CAD status post CABG, anemia of chronic disease Presents to the ED with abdominal pain. Pt was recently in [facility] for peritonitis secondary to the dialysis catheter and underwent removal of the PD catheter.  He was discharged home on 03/24/21 on vancomycin and ceftazidime to be given during dialysis for 2 weeks and the end date was 04/02/2021.  Since completing his antibiotics last week his pain has worsened.  He was still having pain while on antibiotics. He also has for the last year loose stool 1 episode every day.  Does not have much of formed stool Subjective fever Some weight loss No nausea or vomiting Appetite is fair  He was up to 03/09/2021   In the ED on 04/07/2021 BP 106/78, temperature 99.5, heart rate 95 and pulse ox 96% WBC 5.1, Hb 8.7, platelet 177 and creatinine 4.41.  Blood culture sent and he was started on vancomycin and ceftriaxone.  He also was incidentally diagnosed with COVID but he is totally asymptomatic.  He was given remdesivir for 3 days. CT abdomen done 04/08/2021 showed peritoneal ascites increased since the removal of the PD catheter, thick-walled proximal small bowel loops.  Retained low-density stool throughout the distal colon.  No convincing large bowel inflammation. Past Medical History:  Diagnosis Date   Bladder tumor    Cancer [facility])    Bladder   Chronic kidney disease    renal insufficiency   Coronary artery disease    History of kidney stones    Hypertension    Myocardial infarction [facility])    Wears glasses     Past Surgical History:  Procedure Laterality Date   CAPD INSERTION N/A 12/31/2019    Procedure: LAPAROSCOPIC INSERTION CONTINUOUS AMBULATORY PERITONEAL DIALYSIS  (CAPD) CATHETER;  Surgeon: Jules Husbands, MD;  Location: [facility];  Service: General;  Laterality: N/A;   CAPD REMOVAL N/A 04/10/2020   Procedure: LAPAROSCOPIC REVISION OF CONTINUOUS AMBULATORY PERITONEAL DIALYSIS  (CAPD) CATHETER;  Surgeon: Jules Husbands, MD;  Location: [facility];  Service: General;  Laterality: N/A;   CORONARY ARTERY BYPASS GRAFT N/A 12/27/2018   Procedure: CORONARY ARTERY BYPASS GRAFTING (CABG) X 4 ON PUMP USING RIGHT & LEFT INTERNAL MAMMARY ARTERY LEFT RADIAL ARTERY ENDOSCOPICALLY HARVESTED;  Surgeon: [name], MD;  Location: [place];  Service: Open Heart Surgery;  Laterality: N/A;   CYSTOSCOPY W/ RETROGRADES Bilateral 05/15/2019   Procedure: CYSTOSCOPY WITH RETROGRADE PYELOGRAM;  Surgeon: Abbie Sons, MD;  Location: [facility];  Service: Urology;  Laterality: Bilateral;   CYSTOSCOPY WITH BIOPSY N/A 05/15/2019   Procedure: CYSTOSCOPY WITH bladder BIOPSY;  Surgeon: Abbie Sons, MD;  Location: [facility];  Service: Urology;  Laterality: N/A;   DIALYSIS/PERMA CATHETER INSERTION N/A 12/28/2019   Procedure: DIALYSIS/PERMA CATHETER INSERTION;  Surgeon: Algernon Huxley, MD;  Location: [place] [facility];  Service: Cardiovascular;  Laterality: N/A;   DIALYSIS/PERMA CATHETER INSERTION N/A 03/18/2021   Procedure: DIALYSIS/PERMA CATHETER INSERTION;  Surgeon: Algernon Huxley, MD;  Location: [place] [facility];  Service: Cardiovascular;  Laterality: N/A;   DIALYSIS/PERMA CATHETER REMOVAL N/A 06/02/2020   Procedure: DIALYSIS/PERMA CATHETER REMOVAL;  Surgeon: Algernon Huxley, MD;  Location: [place] [facility];  Service: Cardiovascular;  Laterality: N/A;   EXCHANGE OF A DIALYSIS CATHETER Right 04/10/2020   Procedure: EXCHANGE OF A DIALYSIS CATHETER;  Surgeon: Jules Husbands, MD;  Location: [facility];  Service: General;  Laterality: Right;   [place]  01/20/2021   Procedure: HERNIA REPAIR INCISIONAL;   Surgeon: Olean Ree, MD;  Location: [facility];  Service: General;;   IR IMAGE GUIDED DRAINAGE PERCUT CATH  PERITONEAL RETROPERIT  04/07/2020   LEFT HEART CATH AND CORONARY ANGIOGRAPHY Left 12/20/2018   Procedure: LEFT HEART CATH AND CORONARY ANGIOGRAPHY;  Surgeon: Isaias Cowman, MD;  Location: [place] [facility];  Service: Cardiovascular;  Laterality: Left;   RADIAL ARTERY HARVEST Left 12/27/2018   Procedure: ENDOSCOPIC RADIAL ARTERY HARVEST;  Surgeon: [name], MD;  Location: [place];  Service: Open Heart Surgery;  Laterality: Left;   REMOVAL OF A DIALYSIS CATHETER Left 03/20/2021   Procedure: REMOVAL OF A PD CATHETER;  Surgeon: Algernon Huxley, MD;  Location: [facility];  Service: Vascular;  Laterality: Left;   TEE WITHOUT CARDIOVERSION N/A 12/27/2018   Procedure: TRANSESOPHAGEAL ECHOCARDIOGRAM (TEE);  Surgeon: [name], MD;  Location: [place];  Service: Open Heart Surgery;  Laterality: N/A;   TUMOR REMOVAL  2019   Bladder    Social History   Socioeconomic History   Marital status: Married    Spouse name: Not on file   Number of children: Not on file   Years of education: Not on file   Highest education level: Not on file  Occupational History   Not on file  Tobacco Use   Smoking status: Former    Packs/day: 0.25    Types: Cigarettes    Quit date: 02/19/2019    Years since quitting: 2.1   Smokeless tobacco: Never  Vaping Use   Vaping Use: Never used  Substance and Sexual Activity   Alcohol use: Never   Drug use: Never   Sexual activity: Yes  Other Topics Concern   Not on file  Social History Narrative   Not on file   Social Determinants of Health   Financial Resource Strain: Not on file  Food Insecurity: Not on file  Transportation Needs: Not on file  Physical Activity: Not on file  Stress: Not on file  Social Connections: Not on file  Intimate Partner Violence: Not on file    Family History  Family history unknown: Yes   No Known  Allergies I? Current Facility-Administered Medications  Medication Dose Route Frequency Provider Last Rate Last Admin   0.9 %  sodium chloride infusion   Intravenous PRN Richarda Osmond, MD       acetaminophen (TYLENOL) tablet 1,000 mg  1,000 mg Oral Q6H PRN Oswald Hillock, RPH   1,000 mg at 04/08/21 2223   albuterol (VENTOLIN HFA) 108 (90 Base) MCG/ACT inhaler 2 puff  2 puff Inhalation Q6H Agbata, Tochukwu, MD   2 puff at 04/10/21 2100   ascorbic acid (VITAMIN C) tablet 500 mg  500 mg Oral Daily Agbata, Tochukwu, MD   500 mg at 04/10/21 0843   aspirin EC tablet 81 mg  81 mg Oral Daily Agbata, Tochukwu, MD   81 mg at 04/10/21 0843   atorvastatin (LIPITOR) tablet 40 mg  40 mg Oral Daily Agbata, Tochukwu, MD   40 mg at 04/10/21 0843   calcitRIOL (ROCALTROL) capsule 0.5 mcg  0.5 mcg Oral Daily Agbata, Tochukwu, MD   0.5 mcg at  04/10/21 0842   calcium carbonate (OS-CAL - dosed in mg of elemental calcium) tablet 1,250 mg  1,250 mg Oral BID WC Agbata, Tochukwu, MD   1,250 mg at 04/10/21 1722   cefTRIAXone (ROCEPHIN) 2 g in sodium chloride 0.9 % 100 mL IVPB  2 g Intravenous Q24H Agbata, Tochukwu, MD 200 mL/hr at 04/10/21 1126 2 g at 04/10/21 1126   Chlorhexidine Gluconate Cloth 2 % PADS 6 each  6 each Topical Daily Agbata, Tochukwu, MD   6 each at 04/10/21 0844   feeding supplement (NEPRO CARB STEADY) liquid 237 mL  237 mL Oral TID BM Agbata, Tochukwu, MD   237 mL at 04/10/21 2005   guaiFENesin-dextromethorphan (ROBITUSSIN DM) 100-10 MG/5ML syrup 10 mL  10 mL Oral Q4H PRN Agbata, Tochukwu, MD       heparin injection 5,000 Units  5,000 Units Subcutaneous Q8H Agbata, Tochukwu, MD   5,000 Units at 04/10/21 2104   hydrALAZINE (APRESOLINE) tablet 10 mg  10 mg Oral Q8H Agbata, Tochukwu, MD   10 mg at 04/10/21 2105   magnesium oxide (MAG-OX) tablet 400 mg  400 mg Oral Daily Agbata, Tochukwu, MD   400 mg at 04/10/21 0844   metoprolol succinate (TOPROL-XL) 24 hr tablet 50 mg  50 mg Oral Daily Agbata,  Tochukwu, MD   50 mg at 04/10/21 0843   morphine (PF) 2 MG/ML injection 2 mg  2 mg Intravenous Q4H PRN Agbata, Tochukwu, MD   2 mg at 04/10/21 2140   ondansetron (ZOFRAN) tablet 4 mg  4 mg Oral Q6H PRN Agbata, Tochukwu, MD       Or   ondansetron (ZOFRAN) injection 4 mg  4 mg Intravenous Q6H PRN Agbata, Tochukwu, MD       sacubitril-valsartan (ENTRESTO) 24-26 mg per tablet  1 tablet Oral BID Richarda Osmond, MD   1 tablet at 04/10/21 2103   sucroferric oxyhydroxide (VELPHORO) chewable tablet 1,000 mg  1,000 mg Oral TID Agbata, Tochukwu, MD   1,000 mg at 04/10/21 2105   vancomycin (VANCOREADY) IVPB 500 mg/100 mL  500 mg Intravenous Q T,Th,Sa-HD Noralee Space, RPH   Stopped at 04/09/21 1618   zinc sulfate capsule 220 mg  220 mg Oral Daily Agbata, Tochukwu, MD   220 mg at 04/10/21 0843     Abtx:  Anti-infectives (From admission, onward)    Start     Dose/Rate Route Frequency Ordered Stop   04/09/21 1200  vancomycin (VANCOREADY) IVPB 500 mg/100 mL        500 mg 100 mL/hr over 60 Minutes Intravenous Every T-Th-Sa (Hemodialysis) 04/08/21 0939     04/09/21 1000  remdesivir 100 mg in sodium chloride 0.9 % 100 mL IVPB       See Hyperspace for full Linked Orders Report.   100 mg 200 mL/hr over 30 Minutes Intravenous Daily 04/08/21 0915 04/10/21 0921   04/08/21 1200  cefTRIAXone (ROCEPHIN) 2 g in sodium chloride 0.9 % 100 mL IVPB        2 g 200 mL/hr over 30 Minutes Intravenous Every 24 hours 04/08/21 0843     04/08/21 1030  remdesivir 200 mg in sodium chloride 0.9% 250 mL IVPB       See Hyperspace for full Linked Orders Report.   200 mg 580 mL/hr over 30 Minutes Intravenous Once 04/08/21 0915 04/08/21 1336   04/08/21 1000  vancomycin (VANCOREADY) IVPB 1250 mg/250 mL        1,250 mg 166.7 mL/hr over 90 Minutes  Intravenous  Once 04/08/21 0939 04/08/21 1301   04/08/21 0545  piperacillin-tazobactam (ZOSYN) IVPB 3.375 g        3.375 g 100 mL/hr over 30 Minutes Intravenous  Once 04/08/21  0530 04/08/21 1610       REVIEW OF SYSTEMS:  Const: negative fever, negative chills, negative weight loss Eyes: negative diplopia or visual changes, negative eye pain ENT: negative coryza, negative sore throat Resp: negative cough, hemoptysis, dyspnea Cards: negative for chest pain, palpitations, lower extremity edema GU: negative for frequency, dysuria and hematuria GI: Negative for abdominal pain, diarrhea, bleeding, constipation Skin: negative for rash and pruritus Heme: negative for easy bruising and gum/nose bleeding MS: negative for myalgias, arthralgias, back pain and muscle weakness Neurolo:negative for headaches, dizziness, vertigo, memory problems  Psych: negative for feelings of anxiety, depression  Endocrine: negative for thyroid, diabetes Allergy/Immunology- negative for any medication or food allergies ? Pertinent Positives include : Objective:  VITALS:  BP (!) 150/97 (BP Location: Right Arm)    Pulse 73    Temp 97.6 F (36.4 C) (Oral)    Resp 16    Ht 5\' 3"  (1.6 m)    Wt 53.9 kg    SpO2 100%    BMI 21.05 kg/m  PHYSICAL EXAM:  General: Alert, cooperative, no distress, appears stated age.  Head: Normocephalic, without obvious abnormality, atraumatic. Eyes: Conjunctivae clear, anicteric sclerae. Pupils are equal ENT Nares normal. No drainage or sinus tenderness. Lips, mucosa, and tongue normal. No Thrush Neck: Supple, symmetrical, no adenopathy, thyroid: non tender no carotid bruit and no JVD. Back: No CVA tenderness. Lungs: Clear to auscultation bilaterally. No Wheezing or Rhonchi. No rales. Heart: Regular rate and rhythm, no murmur, rub or gallop. Abdomen: Soft, non-tender,not distended. Bowel sounds normal. No masses Extremities: atraumatic, no cyanosis. No edema. No clubbing Skin: No rashes or lesions. Or bruising Lymph: Cervical, supraclavicular normal. Neurologic: Grossly non-focal Pertinent Labs Lab Results CBC    Component Value Date/Time   WBC 6.3  04/09/2021 0352   RBC 3.28 (L) 04/09/2021 0352   HGB 8.7 (L) 04/09/2021 0352   HGB 9.1 (L) 01/04/2020 1540   HCT 29.7 (L) 04/09/2021 0352   HCT 29.6 (L) 01/04/2020 1540   PLT 183 04/09/2021 0352   PLT 191 01/04/2020 1540   MCV 90.5 04/09/2021 0352   MCV 94 01/04/2020 1540   MCH 26.5 04/09/2021 0352   MCHC 29.3 (L) 04/09/2021 0352   RDW 17.6 (H) 04/09/2021 0352   RDW 16.2 (H) 01/04/2020 1540   LYMPHSABS 0.9 03/14/2021 2112   LYMPHSABS 1.0 01/04/2020 1540   MONOABS 1.4 (H) 03/14/2021 2112   EOSABS 0.2 03/14/2021 2112   EOSABS 0.5 (H) 03/27/2018 1643   BASOSABS 0.0 03/14/2021 2112   BASOSABS 0.0 03/27/2018 1643    CMP Latest Ref Rng & Units 04/10/2021 04/09/2021 04/07/2021  Glucose 70 - 99 mg/dL 100(H) 72 99  BUN 8 - 23 mg/dL 33(H) 41(H) 23  Creatinine 0.61 - 1.24 mg/dL 5.36(H) 6.30(H) 4.41(H)  Sodium 135 - 145 mmol/L 140 139 139  Potassium 3.5 - 5.1 mmol/L 4.5 4.4 2.9(L)  Chloride 98 - 111 mmol/L 103 104 104  CO2 22 - 32 mmol/L 28 22 27   Calcium 8.9 - 10.3 mg/dL 6.9(L) 5.7(LL) 5.9(LL)  Total Protein 6.5 - 8.1 g/dL - 4.8(L) 5.4(L)  Total Bilirubin 0.3 - 1.2 mg/dL - 0.5 0.4  Alkaline Phos 38 - 126 U/L - 49 57  AST 15 - 41 U/L - 31 50(H)  ALT 0 -  44 U/L - 47(H) 70(H)      Microbiology: Recent Results (from the past 240 hour(s))  Culture, blood (routine x 2)     Status: None (Preliminary result)   Collection Time: 04/08/21  5:45 AM   Specimen: BLOOD  Result Value Ref Range Status   Specimen Description BLOOD LEFT HAND  Final   Special Requests   Final    BOTTLES DRAWN AEROBIC AND ANAEROBIC Blood Culture results may not be optimal due to an excessive volume of blood received in culture bottles   Culture   Final    NO GROWTH 2 DAYS Performed at [facility], [address], [place], [place] [address]    Report Status PENDING  Incomplete  Resp Panel by RT-PCR (Flu A&B, Covid) Nasopharyngeal Swab     Status: Abnormal   Collection Time: 04/08/21  5:45 AM    Specimen: Nasopharyngeal Swab; Nasopharyngeal(NP) swabs in vial transport medium  Result Value Ref Range Status   SARS Coronavirus 2 by RT PCR POSITIVE (A) NEGATIVE Final    Comment: (NOTE) SARS-CoV-2 target nucleic acids are DETECTED.  The SARS-CoV-2 RNA is generally detectable in upper respiratory specimens during the acute phase of infection. Positive results are indicative of the presence of the identified virus, but do not rule out bacterial infection or co-infection with other pathogens not detected by the test. Clinical correlation with patient history and other diagnostic information is necessary to determine patient infection status. The expected result is Negative.  Fact Sheet for Patients: EntrepreneurPulse.com.au  Fact Sheet for Healthcare Providers: IncredibleEmployment.be  This test is not yet approved or cleared by the [place] FDA and  has been authorized for detection and/or diagnosis of SARS-CoV-2 by FDA under an Emergency Use Authorization (EUA).  This EUA will remain in effect (meaning this test can be used) for the duration of  the COVID-19 declaration under Section 564(b)(1) of the A ct, 21 U.S.C. section 360bbb-3(b)(1), unless the authorization is terminated or revoked sooner.     Influenza A by PCR NEGATIVE NEGATIVE Final   Influenza B by PCR NEGATIVE NEGATIVE Final    Comment: (NOTE) The Xpert Xpress SARS-CoV-2/FLU/RSV plus assay is intended as an aid in the diagnosis of influenza from Nasopharyngeal swab specimens and should not be used as a sole basis for treatment. Nasal washings and aspirates are unacceptable for Xpert Xpress SARS-CoV-2/FLU/RSV testing.  Fact Sheet for Patients: EntrepreneurPulse.com.au  Fact Sheet for Healthcare Providers: IncredibleEmployment.be  This test is not yet approved or cleared by the [place] FDA and has been authorized for detection  and/or diagnosis of SARS-CoV-2 by FDA under an Emergency Use Authorization (EUA). This EUA will remain in effect (meaning this test can be used) for the duration of the COVID-19 declaration under Section 564(b)(1) of the Act, 21 U.S.C. section 360bbb-3(b)(1), unless the authorization is terminated or revoked.  Performed at [facility], [address] [place], [place] [address]   Culture, blood (routine x 2)     Status: None (Preliminary result)   Collection Time: 04/08/21  5:46 AM   Specimen: BLOOD  Result Value Ref Range Status   Specimen Description BLOOD RIGHT HAND  Final   Special Requests   Final    BOTTLES DRAWN AEROBIC AND ANAEROBIC Blood Culture adequate volume   Culture   Final    NO GROWTH 2 DAYS Performed at [facility], [address] [place],  [address]    Report Status PENDING  Incomplete  MRSA  Next Gen by PCR, Nasal     Status: None   Collection Time: 04/08/21  3:16 PM   Specimen: Nasal Mucosa; Nasal Swab  Result Value Ref Range Status   MRSA by PCR Next Gen NOT DETECTED NOT DETECTED Final    Comment: (NOTE) The GeneXpert MRSA Assay (FDA approved for NASAL specimens only), is one component of a comprehensive MRSA colonization surveillance program. It is not intended to diagnose MRSA infection nor to guide or monitor treatment for MRSA infections. Test performance is not FDA approved in patients less than 21 years old. Performed at [facility], [address] [place], [place] [address]     IMAGING RESULTS:  I have personally reviewed the films ?Peritoneal dialysis catheter has been removed since last month, but peritoneal Ascites has increased since that time. This is nonspecific but consider peritonitis in the setting of abdominal pain and hypotension.   2. Thick-walled proximal small bowel loops, nonspecific. No transition point or convincing bowel obstruction. Infectious enteritis remains  possible.  Impression/Recommendation Chronic abdominal pain , for many weeks Being treated like peritonitis , related to peritoneal dialysis initially - Dec 5,2022 had enterobacter clocae, Jan 2023 staph epidermidis, last culture from 03/14/21 ascitic fluid NG. PD cath was removed on 03/20/21 and he completed IV antibiotic on 04/02/21  Now more ascites seen in CT with pain-  Need to r/o TB peritonitis and lymphoma with peritoneal mets especially with small bowel thickening and loose stool . No large bowel thickening  need to do large volume paracentesis and send for cytology, AFB culture, fungal culture and bacterial culture  If this procedure does not yield a diagnosis may need small bowel follow thru,  Peritoneal biopsy. Pt is on vanco and ceftriaxone since admission  COVID 19- incidentally positive but asymptomatic   Anemia due to ESRD  Hypoalbuminemia'  ESRDon dilaysis thru IJ HD cath  CAD s/p CABG  HTN- management as per primary team ? ?Discussed with patient,and care team ID will follow him peripherally this weekend- call if needed Note:  This document was prepared using Dragon voice recognition software and may include unintentional dictation errors.

## 2021-04-10 NOTE — Progress Notes (Signed)
UA speciem is not obtained due to no urine output during the shift. Pt is educated to use the collection chamber in the bathroom and then call staff to obtain the urine.

## 2021-04-10 NOTE — Progress Notes (Signed)
Pt refused for In and Out cath, Benancio Deeds B, NP notified.

## 2021-04-10 NOTE — Consult Note (Signed)
Pharmacy Antibiotic Note  Anthony Hess is a 66 y.o. male admitted on 04/08/2021 with  peritonitis .  Pharmacy has been consulted for vancomycin dosing.  Plan: Vancomycin 1250mg  IV once followed by 500mg  IV every Tue Thur Sat  with hemodialysis Collect vancomycin random  level on 2/24 prior to 4th dose.   Height: 5\' 3"  (160 cm) Weight: 53.9 kg (118 lb 13.3 oz) IBW/kg (Calculated) : 56.9  Temp (24hrs), Avg:98.2 F (36.8 C), Min:98 F (36.7 C), Max:98.6 F (37 C)  Recent Labs  Lab 04/07/21 2258 04/08/21 0545 04/09/21 0352 04/10/21 0434  WBC 5.1  --  6.3  --   CREATININE 4.41*  --  6.30* 5.36*  LATICACIDVEN  --  1.3  --   --     Estimated Creatinine Clearance: 10.8 mL/min (A) (by C-G formula based on SCr of 5.36 mg/dL (H)).    No Known Allergies  Antimicrobials this admission: 2/15 Vancomycin >>   Microbiology results: 2/15 BCx: NGTD  Thank you for allowing pharmacy to be a part of this patients care.  Darrick Penna 04/10/2021 3:45 PM

## 2021-04-10 NOTE — Progress Notes (Signed)
PROGRESS NOTE  Anthony Hess    DOB: 1955-10-26, 66 y.o.  VXB:[id number]    Code Status: Full Code   DOA: 04/08/2021   LOS: 2   Brief hospital course  Anthony Hess is a 66 y.o. male with a PMH significant for ESRD on PD previously and now HD TThS. They presented from home to the ED on 04/08/2021 with diffuse abdominal pain x 7 days. He had his non-functioning PD catheter removed about a week ago.  On another note, he has been receiving HD through a tunneled cath but his most recent session was shortened due to hypotension. In the ED, it was found that they had mild ascites, peritonitis on CT scan. Signs of enteritis also present.  They were treated with IV Abx and supportive care. Nephrology was consulted for routine HD.  Additionally, patient was found to be COVID-19 positive.  Patient was admitted to medicine service for further workup and management of peritonitis as outlined in detail below.  04/10/21 -stable, improved  Assessment & Plan  Principal Problem:   Peritonitis associated with peritoneal dialysis ([place]) Active Problems:   Anemia in ESRD (end-stage renal disease) (HCC)   Hypocalcemia   Hypokalemia   COVID-19 virus infection   Abdominal pain  Peritonitis- likely related to recent PD catheter removed prior to admission. Has previously been treated with vancomycin and ceftaz - continue antibiotics  - CTX (2/15-  - vanc (2/15-  - got one dose zosyn 2/15 - IR consulted for diagnostic paracentesis  - planning to complete when off COVID isolation? - consult ID to help guide antibiotic choice  Hypocalcemia- corrects to 8.4 with significant hypoalbuminemia s/p calcium gluconate and HD yesterday.  - continue to monitor  Covid-19- mildly symptomatic. Stable ORA - continue remdesivir (2/15-2/17) - supportive care  ESRD on HD TThS - management per nephrology - RFP on HD days  HTN- moderately well controlled - continue home hydralazine, metoprolol.Marland KitchenMarland KitchenIrbesartan  -  transition to entresto today  Body mass index is 21.05 kg/m.  VTE ppx: heparin injection 5,000 Units Start: 04/08/21 0945   Diet:     Diet   Diet renal with fluid restriction Fluid restriction: 1200 mL Fluid; Room service appropriate? Yes; Fluid consistency: Thin   Subjective 04/10/21    Pt reports continued mild abdominal pain. He feels overall well and that he is very hungry. He is OK with getting paracentesis.    Objective   Vitals:   04/09/21 1646 04/09/21 1708 04/09/21 2002 04/10/21 0531  BP: (!) 167/99 (!) 155/102 98/76 (!) 153/107  Pulse:  80 88 70  Resp: 15 16 18 17   Temp: 98.1 F (36.7 C) 98.6 F (37 C) 98.2 F (36.8 C) 98 F (36.7 C)  TempSrc: Oral Oral Oral Oral  SpO2:  98% 95% 97%  Weight: 53.9 kg     Height:        Intake/Output Summary (Last 24 hours) at 04/10/2021 0734 Last data filed at 04/09/2021 1741 Gross per 24 hour  Intake 255.78 ml  Output 1000 ml  Net -744.22 ml    Filed Weights   04/08/21 1443 04/09/21 1337 04/09/21 1646  Weight: 52.9 kg 55.6 kg 53.9 kg     Physical Exam:  General: awake, alert, NAD HEENT: atraumatic, clear conjunctiva, anicteric sclera, MMM, hearing grossly normal Respiratory: normal respiratory effort. Cardiovascular: normal S1/S2, RRR, no JVD, murmurs, quick capillary refill  Gastrointestinal: soft, enlarged, mildly tender generalized Nervous: A&O x3. no gross focal neurologic deficits, normal speech Extremities: moves  all equally, no edema, normal tone Skin: dry, intact, normal temperature, normal color. No rashes, lesions or ulcers on exposed skin Psychiatry: normal mood, congruent affect  Labs   I have personally reviewed the following labs and imaging studies CBC    Component Value Date/Time   WBC 6.3 04/09/2021 0352   RBC 3.28 (L) 04/09/2021 0352   HGB 8.7 (L) 04/09/2021 0352   HGB 9.1 (L) 01/04/2020 1540   HCT 29.7 (L) 04/09/2021 0352   HCT 29.6 (L) 01/04/2020 1540   PLT 183 04/09/2021 0352   PLT  191 01/04/2020 1540   MCV 90.5 04/09/2021 0352   MCV 94 01/04/2020 1540   MCH 26.5 04/09/2021 0352   MCHC 29.3 (L) 04/09/2021 0352   RDW 17.6 (H) 04/09/2021 0352   RDW 16.2 (H) 01/04/2020 1540   LYMPHSABS 0.9 03/14/2021 2112   LYMPHSABS 1.0 01/04/2020 1540   MONOABS 1.4 (H) 03/14/2021 2112   EOSABS 0.2 03/14/2021 2112   EOSABS 0.5 (H) 03/27/2018 1643   BASOSABS 0.0 03/14/2021 2112   BASOSABS 0.0 03/27/2018 1643   BMP Latest Ref Rng & Units 04/10/2021 04/09/2021 04/07/2021  Glucose 70 - 99 mg/dL 100(H) 72 99  BUN 8 - 23 mg/dL 33(H) 41(H) 23  Creatinine 0.61 - 1.24 mg/dL 5.36(H) 6.30(H) 4.41(H)  BUN/Creat Ratio 10 - 24 - - -  Sodium 135 - 145 mmol/L 140 139 139  Potassium 3.5 - 5.1 mmol/L 4.5 4.4 2.9(L)  Chloride 98 - 111 mmol/L 103 104 104  CO2 22 - 32 mmol/L 28 22 27   Calcium 8.9 - 10.3 mg/dL 6.9(L) 5.7(LL) 5.9(LL)    Disposition Plan & Communication  Patient status: Inpatient  Admitted From: Home Planned disposition location: Home Anticipated discharge date: TBD pending improvement in peritonitiis  Family Communication: none    Author: Richarda Osmond, DO [facility] 04/10/2021, 7:34 AM   Available by Epic secure chat 7AM-7PM. If 7PM-7AM, please contact night-coverage.  [facility] contact information found on [email or web address].

## 2021-04-10 NOTE — Progress Notes (Signed)
[facility]  ROUNDING NOTE   Subjective:   Anthony Hess is a 66 year old male with past medical conditions including bladder cancer, CAD, hypertension, and end-stage renal disease on hemodialysis.  Patient presents to the emergency department with complaints of abdominal pain.  Patient has been admitted for Hypocalcemia [E83.51] Hypokalemia [E87.6] SBP (spontaneous bacterial peritonitis) ([place]) [K65.2] Peritonitis associated with peritoneal dialysis ([place]) [T85.71XA] Abdominal pain, unspecified abdominal location [R10.9]  Patient is known to our practice and receives outpatient dialysis treatments at [facility] on a TTS schedule, supervised by Dr. Holley Raring.    Patient resting quietly States he now has lower abdominal pain that began 2-3 days ago Tolerating small meals Denies shortness of breath  Dialysis yesterday, tolerated well   Objective:  Vital signs in last 24 hours:  Temp:  [98 F (36.7 C)-98.6 F (37 C)] 98 F (36.7 C) (02/17 1308) Pulse Rate:  [63-88] 73 (02/17 1308) Resp:  [12-26] 16 (02/17 0838) BP: (98-167)/(76-107) 130/88 (02/17 1308) SpO2:  [95 %-99 %] 99 % (02/17 1308) Weight:  [53.9 kg] 53.9 kg (02/16 1646)  Weight change: -1.7 kg Filed Weights   04/08/21 1443 04/09/21 1337 04/09/21 1646  Weight: 52.9 kg 55.6 kg 53.9 kg    Intake/Output: I/O last 3 completed shifts: In: 735.8 [P.O.:480; IV Piggyback:255.8] Out: 1000 [Other:1000]   Intake/Output this shift:  Total I/O In: -  Out: 20 [Urine:20]  Physical Exam: General: NAD, resting in bed  Head: Normocephalic, atraumatic. Moist oral mucosal membranes  Eyes: Anicteric  Lungs:  Clear to auscultation, normal effort  Heart: Regular rate and rhythm  Abdomen:  Soft, tender, mild distention  Extremities: No peripheral edema.  Neurologic: Nonfocal, moving all four extremities  Skin: No lesions  Access: Right IJ PermCath    Basic Metabolic Panel: Recent Labs  Lab 04/07/21 2258  04/09/21 0352 04/10/21 0434  NA 139 139 140  K 2.9* 4.4 4.5  CL 104 104 103  CO2 27 22 28   GLUCOSE 99 72 100*  BUN 23 41* 33*  CREATININE 4.41* 6.30* 5.36*  CALCIUM 5.9* 5.7* 6.9*  MG  --  2.0  --   PHOS  --   --  6.4*     Liver Function Tests: Recent Labs  Lab 04/07/21 2258 04/09/21 0352 04/10/21 0434  AST 50* 31  --   ALT 70* 47*  --   ALKPHOS 57 49  --   BILITOT 0.4 0.5  --   PROT 5.4* 4.8*  --   ALBUMIN 2.3* 2.0* 2.1*    Recent Labs  Lab 04/07/21 2258  LIPASE 47    No results for input(s): AMMONIA in the last 168 hours.  CBC: Recent Labs  Lab 04/07/21 2258 04/09/21 0352  WBC 5.1 6.3  HGB 8.7* 8.7*  HCT 29.8* 29.7*  MCV 90.0 90.5  PLT 177 183     Cardiac Enzymes: No results for input(s): CKTOTAL, CKMB, CKMBINDEX, TROPONINI in the last 168 hours.  BNP: Invalid input(s): POCBNP  CBG: No results for input(s): GLUCAP in the last 168 hours.  Microbiology: Results for orders placed or performed during the hospital encounter of 04/08/21  Culture, blood (routine x 2)     Status: None (Preliminary result)   Collection Time: 04/08/21  5:45 AM   Specimen: BLOOD  Result Value Ref Range Status   Specimen Description BLOOD LEFT HAND  Final   Special Requests   Final    BOTTLES DRAWN AEROBIC AND ANAEROBIC Blood Culture results may not  be optimal due to an excessive volume of blood received in culture bottles   Culture   Final    NO GROWTH 2 DAYS Performed at [facility], [address] [place], [place] [address]    Report Status PENDING  Incomplete  Resp Panel by RT-PCR (Flu A&B, Covid) Nasopharyngeal Swab     Status: Abnormal   Collection Time: 04/08/21  5:45 AM   Specimen: Nasopharyngeal Swab; Nasopharyngeal(NP) swabs in vial transport medium  Result Value Ref Range Status   SARS Coronavirus 2 by RT PCR POSITIVE (A) NEGATIVE Final    Comment: (NOTE) SARS-CoV-2 target nucleic acids are DETECTED.  The SARS-CoV-2 RNA is generally  detectable in upper respiratory specimens during the acute phase of infection. Positive results are indicative of the presence of the identified virus, but do not rule out bacterial infection or co-infection with other pathogens not detected by the test. Clinical correlation with patient history and other diagnostic information is necessary to determine patient infection status. The expected result is Negative.  Fact Sheet for Patients: EntrepreneurPulse.com.au  Fact Sheet for Healthcare Providers: IncredibleEmployment.be  This test is not yet approved or cleared by the [place] FDA and  has been authorized for detection and/or diagnosis of SARS-CoV-2 by FDA under an Emergency Use Authorization (EUA).  This EUA will remain in effect (meaning this test can be used) for the duration of  the COVID-19 declaration under Section 564(b)(1) of the A ct, 21 U.S.C. section 360bbb-3(b)(1), unless the authorization is terminated or revoked sooner.     Influenza A by PCR NEGATIVE NEGATIVE Final   Influenza B by PCR NEGATIVE NEGATIVE Final    Comment: (NOTE) The Xpert Xpress SARS-CoV-2/FLU/RSV plus assay is intended as an aid in the diagnosis of influenza from Nasopharyngeal swab specimens and should not be used as a sole basis for treatment. Nasal washings and aspirates are unacceptable for Xpert Xpress SARS-CoV-2/FLU/RSV testing.  Fact Sheet for Patients: EntrepreneurPulse.com.au  Fact Sheet for Healthcare Providers: IncredibleEmployment.be  This test is not yet approved or cleared by the [place] FDA and has been authorized for detection and/or diagnosis of SARS-CoV-2 by FDA under an Emergency Use Authorization (EUA). This EUA will remain in effect (meaning this test can be used) for the duration of the COVID-19 declaration under Section 564(b)(1) of the Act, 21 U.S.C. section 360bbb-3(b)(1), unless the  authorization is terminated or revoked.  Performed at [facility], [address] [place], [place] [address]   Culture, blood (routine x 2)     Status: None (Preliminary result)   Collection Time: 04/08/21  5:46 AM   Specimen: BLOOD  Result Value Ref Range Status   Specimen Description BLOOD RIGHT HAND  Final   Special Requests   Final    BOTTLES DRAWN AEROBIC AND ANAEROBIC Blood Culture adequate volume   Culture   Final    NO GROWTH 2 DAYS Performed at [facility], [address] [place], [place] [address]    Report Status PENDING  Incomplete  MRSA Next Gen by PCR, Nasal     Status: None   Collection Time: 04/08/21  3:16 PM   Specimen: Nasal Mucosa; Nasal Swab  Result Value Ref Range Status   MRSA by PCR Next Gen NOT DETECTED NOT DETECTED Final    Comment: (NOTE) The GeneXpert MRSA Assay (FDA approved for NASAL specimens only), is one component of a comprehensive MRSA colonization surveillance program. It is not intended to diagnose MRSA infection nor to  guide or monitor treatment for MRSA infections. Test performance is not FDA approved in patients less than 65 years old. Performed at [facility], [address] [place], [place] [address]     Coagulation Studies: No results for input(s): LABPROT, INR in the last 72 hours.  Urinalysis: No results for input(s): COLORURINE, LABSPEC, PHURINE, GLUCOSEU, HGBUR, BILIRUBINUR, KETONESUR, PROTEINUR, UROBILINOGEN, NITRITE, LEUKOCYTESUR in the last 72 hours.  Invalid input(s): APPERANCEUR    Imaging: No results found.   Medications:    sodium chloride     cefTRIAXone (ROCEPHIN)  IV 2 g (04/10/21 1126)   vancomycin Stopped (04/09/21 1618)    albuterol  2 puff Inhalation Q6H   vitamin C  500 mg Oral Daily   aspirin EC  81 mg Oral Daily   atorvastatin  40 mg Oral Daily   calcitRIOL  0.5 mcg Oral Daily   calcium carbonate  1,250 mg Oral BID WC   Chlorhexidine Gluconate Cloth  6 each  Topical Daily   feeding supplement (NEPRO CARB STEADY)  237 mL Oral TID BM   heparin injection (subcutaneous)  5,000 Units Subcutaneous Q8H   hydrALAZINE  10 mg Oral Q8H   magnesium oxide  400 mg Oral Daily   metoprolol succinate  50 mg Oral Daily   sacubitril-valsartan  1 tablet Oral BID   sucroferric oxyhydroxide  1,000 mg Oral TID   zinc sulfate  220 mg Oral Daily   sodium chloride, acetaminophen, guaiFENesin-dextromethorphan, morphine injection, ondansetron **OR** ondansetron (ZOFRAN) IV  Assessment/ Plan:  Anthony Hess is a 66 y.o.  male with past medical conditions including bladder cancer, CAD, hypertension, and end-stage renal disease on hemodialysis.  Patient presents to the emergency department with complaints of abdominal pain.  Patient has been admitted for Hypocalcemia [E83.51] Hypokalemia [E87.6] SBP (spontaneous bacterial peritonitis) ([place]) [K65.2] Peritonitis associated with peritoneal dialysis ([place]) [T85.71XA] Abdominal pain, unspecified abdominal location [R10.9]  [facility]/TTS/ Rt Permcath  End-stage renal disease on hemodialysis.  Will maintain outpatient schedule if possible.  Dialysis received yesterday, UF goal 1L achieved. Next treatment scheduled for Saturday.   2. Anemia of chronic kidney disease Normocytic  Lab Results  Component Value Date   HGB 8.7 (L) 04/09/2021    Hemoglobin below desired target, Micera ordered outpatient We will continue to monitor  3. Secondary Hyperparathyroidism:  Lab Results  Component Value Date   PTH 159 (H) 12/27/2019   CALCIUM 6.9 (L) 04/10/2021   CAION 0.74 (LL) 01/20/2021   PHOS 6.4 (H) 04/10/2021  Corrected calcium improved to 8.1 after 2g calcium supplementation and calcium 3 bath during dialysis.  Continue Vitamin D and Velphoro   along with calcium carbonate twice daily  4.  Hypertension with chronic kidney disease.  Outpatient regimen includes hydralazine, irbesartan, and metoprolol. Irbesartan held at  this time. Now on Entresto. BP 130/88  5.  Peritonitis associated with peritoneal dialysis.  Empirically placed on vancomycin and Rocephin. . 6.  COVID-19 positive Positive finding on 04/08/2021.  Currently receiving remdesivir and supportive care therapies.   LOS: 2 [place] 2/17/20231:50 PM

## 2021-04-10 NOTE — Consult Note (Signed)
Subjective:   CC: abdominal pain  HPI:  Anthony Hess is a 66 y.o. male who was consulted by Ouida Sills for issue above.  Symptoms were first noted several days ago. Pain is sharp, abdominal.  Associated with some nausea, exacerbated by nothing specific.     Past Medical History:  has a past medical history of Bladder tumor, Cancer ([place]), Chronic kidney disease, Coronary artery disease, History of kidney stones, Hypertension, Myocardial infarction ([place]), and Wears glasses.  Past Surgical History:  Past Surgical History:  Procedure Laterality Date   CAPD INSERTION N/A 12/31/2019   Procedure: LAPAROSCOPIC INSERTION CONTINUOUS AMBULATORY PERITONEAL DIALYSIS  (CAPD) CATHETER;  Surgeon: Jules Husbands, MD;  Location: [facility];  Service: General;  Laterality: N/A;   CAPD REMOVAL N/A 04/10/2020   Procedure: LAPAROSCOPIC REVISION OF CONTINUOUS AMBULATORY PERITONEAL DIALYSIS  (CAPD) CATHETER;  Surgeon: Jules Husbands, MD;  Location: [facility];  Service: General;  Laterality: N/A;   CORONARY ARTERY BYPASS GRAFT N/A 12/27/2018   Procedure: CORONARY ARTERY BYPASS GRAFTING (CABG) X 4 ON PUMP USING RIGHT & LEFT INTERNAL MAMMARY ARTERY LEFT RADIAL ARTERY ENDOSCOPICALLY HARVESTED;  Surgeon: [name], MD;  Location: [place];  Service: Open Heart Surgery;  Laterality: N/A;   CYSTOSCOPY W/ RETROGRADES Bilateral 05/15/2019   Procedure: CYSTOSCOPY WITH RETROGRADE PYELOGRAM;  Surgeon: Abbie Sons, MD;  Location: [facility];  Service: Urology;  Laterality: Bilateral;   CYSTOSCOPY WITH BIOPSY N/A 05/15/2019   Procedure: CYSTOSCOPY WITH bladder BIOPSY;  Surgeon: Abbie Sons, MD;  Location: [facility];  Service: Urology;  Laterality: N/A;   DIALYSIS/PERMA CATHETER INSERTION N/A 12/28/2019   Procedure: DIALYSIS/PERMA CATHETER INSERTION;  Surgeon: Algernon Huxley, MD;  Location: [place] [facility];  Service: Cardiovascular;  Laterality: N/A;   DIALYSIS/PERMA CATHETER INSERTION N/A 03/18/2021   Procedure:  DIALYSIS/PERMA CATHETER INSERTION;  Surgeon: Algernon Huxley, MD;  Location: [place] [facility];  Service: Cardiovascular;  Laterality: N/A;   DIALYSIS/PERMA CATHETER REMOVAL N/A 06/02/2020   Procedure: DIALYSIS/PERMA CATHETER REMOVAL;  Surgeon: Algernon Huxley, MD;  Location: [place] [facility];  Service: Cardiovascular;  Laterality: N/A;   EXCHANGE OF A DIALYSIS CATHETER Right 04/10/2020   Procedure: EXCHANGE OF A DIALYSIS CATHETER;  Surgeon: Jules Husbands, MD;  Location: [facility];  Service: General;  Laterality: Right;   [place]  01/20/2021   Procedure: HERNIA REPAIR INCISIONAL;  Surgeon: Olean Ree, MD;  Location: [facility];  Service: General;;   IR IMAGE GUIDED DRAINAGE PERCUT CATH  PERITONEAL RETROPERIT  04/07/2020   LEFT HEART CATH AND CORONARY ANGIOGRAPHY Left 12/20/2018   Procedure: LEFT HEART CATH AND CORONARY ANGIOGRAPHY;  Surgeon: Isaias Cowman, MD;  Location: [place] [facility];  Service: Cardiovascular;  Laterality: Left;   RADIAL ARTERY HARVEST Left 12/27/2018   Procedure: ENDOSCOPIC RADIAL ARTERY HARVEST;  Surgeon: [name], MD;  Location: [place];  Service: Open Heart Surgery;  Laterality: Left;   REMOVAL OF A DIALYSIS CATHETER Left 03/20/2021   Procedure: REMOVAL OF A PD CATHETER;  Surgeon: Algernon Huxley, MD;  Location: [facility];  Service: Vascular;  Laterality: Left;   TEE WITHOUT CARDIOVERSION N/A 12/27/2018   Procedure: TRANSESOPHAGEAL ECHOCARDIOGRAM (TEE);  Surgeon: [name], MD;  Location: [place];  Service: Open Heart Surgery;  Laterality: N/A;   TUMOR REMOVAL  2019   Bladder    Family History: Family history is unknown by patient.  Social History:  reports that he quit smoking about 2 years  ago. His smoking use included cigarettes. He smoked an average of .25 packs per day. He has never used smokeless tobacco. He reports that he does not drink alcohol and does not use drugs.  Current Medications:  Prior to Admission medications    Medication Sig Start Date End Date Taking? Authorizing Provider  acetaminophen (TYLENOL) 500 MG tablet Take 2 tablets (1,000 mg total) by mouth every 6 (six) hours as needed for mild pain. 01/20/21  Yes Piscoya, Jacqulyn Bath, MD  aspirin EC 81 MG tablet Take 81 mg by mouth daily.   Yes [name], MD  atorvastatin (LIPITOR) 40 MG tablet Take 1 tablet (40 mg total) by mouth daily. 01/01/20  Yes Fritzi Mandes, MD  benzonatate (TESSALON) 200 MG capsule Take 200 mg by mouth 3 (three) times daily as needed. 02/27/21  Yes [provider]  calcitRIOL (ROCALTROL) 0.5 MCG capsule Take 1 capsule (0.5 mcg total) by mouth daily. 01/01/20  Yes Fritzi Mandes, MD  calcium carbonate (OS-CAL - DOSED IN MG OF ELEMENTAL CALCIUM) 1250 (500 Ca) MG tablet Take 1 tablet (1,250 mg total) by mouth 2 (two) times daily between meals. 01/01/20  Yes Fritzi Mandes, MD  hydrALAZINE (APRESOLINE) 10 MG tablet Take 10 mg by mouth 3 (three) times daily. 01/12/21  Yes [provider]  irbesartan (AVAPRO) 150 MG tablet Take 150 mg by mouth daily. 09/18/20  Yes [provider]  magnesium oxide (MAG-OX) 400 (240 Mg) MG tablet Take 1 tablet (400 mg total) by mouth daily. 03/24/21  Yes Nicole Kindred A, DO  metoprolol succinate (TOPROL-XL) 50 MG 24 hr tablet Take 1 tablet (50 mg total) by mouth daily. Take with or immediately following a meal. 01/01/20  Yes Fritzi Mandes, MD  multivitamin (RENA-VIT) TABS tablet Take 1 tablet by mouth at bedtime. 03/23/21  Yes Nicole Kindred A, DO  Nutritional Supplements (FEEDING SUPPLEMENT, NEPRO CARB STEADY,) LIQD Take 237 mLs by mouth 3 (three) times daily between meals. 03/23/21  Yes Nicole Kindred A, DO  VELPHORO 500 MG chewable tablet Chew 1,000 mg by mouth 3 (three) times daily. 01/21/21  Yes [provider]  Vitamin D, Ergocalciferol, (DRISDOL) 1.25 MG (50000 UNIT) CAPS capsule Take 1 capsule (50,000 Units total) by mouth every 7 (seven) days. Patient taking differently: Take  50,000 Units by mouth every Saturday. 01/05/20  Yes Fritzi Mandes, MD  ondansetron (ZOFRAN-ODT) 8 MG disintegrating tablet Take 8 mg by mouth every 8 (eight) hours as needed. 02/27/21   [provider]    Allergies:  Allergies as of 04/07/2021   (No Known Allergies)    ROS:  General: Denies weight loss, weight gain, fatigue, fevers, chills, and night sweats. Eyes: Denies blurry vision, double vision, eye pain, itchy eyes, and tearing. Ears: Denies hearing loss, earache, and ringing in ears. Nose: Denies sinus pain, congestion, infections, runny nose, and nosebleeds. Mouth/throat: Denies hoarseness, sore throat, bleeding gums, and difficulty swallowing. Heart: Denies chest pain, palpitations, racing heart, irregular heartbeat, leg pain or swelling, and decreased activity tolerance. Respiratory: Denies breathing difficulty, shortness of breath, wheezing, cough, and sputum. GI: Denies change in appetite, heartburn, nausea, vomiting, constipation, diarrhea, and blood in stool. GU: Denies difficulty urinating, pain with urinating, urgency, frequency, blood in urine. Musculoskeletal: Denies joint stiffness, pain, swelling, muscle weakness. Skin: Denies rash, itching, mass, tumors, sores, and boils Neurologic: Denies headache, fainting, dizziness, seizures, numbness, and tingling. Psychiatric: Denies depression, anxiety, difficulty sleeping, and memory loss. Endocrine: Denies heat or cold intolerance, and increased thirst or  urination. Blood/lymph: Denies easy bruising, easy bruising, and swollen glands     Objective:     BP 130/88 (BP Location: Right Arm)    Pulse 73    Temp 98 F (36.7 C) (Oral)    Resp 16    Ht 5\' 3"  (1.6 m)    Wt 53.9 kg    SpO2 99%    BMI 21.05 kg/m   Constitutional :  alert, cooperative, appears stated age, and no distress  Lymphatics/Throat:  no asymmetry, masses, or scars  Respiratory:  clear to auscultation bilaterally  Cardiovascular:  regular rate and  rhythm  Gastrointestinal: Soft, on guarding, some TTP all quadrants, more so in RLQ at time of exam  palpable lump x2 along midline, likely incisional hernia.   Musculoskeletal: Steady movement  Skin: Cool and moist, visible surgical scars   Psychiatric: Normal affect, non-agitated, not confused       LABS:  CMP Latest Ref Rng & Units 04/10/2021 04/09/2021 04/07/2021  Glucose 70 - 99 mg/dL 100(H) 72 99  BUN 8 - 23 mg/dL 33(H) 41(H) 23  Creatinine 0.61 - 1.24 mg/dL 5.36(H) 6.30(H) 4.41(H)  Sodium 135 - 145 mmol/L 140 139 139  Potassium 3.5 - 5.1 mmol/L 4.5 4.4 2.9(L)  Chloride 98 - 111 mmol/L 103 104 104  CO2 22 - 32 mmol/L 28 22 27   Calcium 8.9 - 10.3 mg/dL 6.9(L) 5.7(LL) 5.9(LL)  Total Protein 6.5 - 8.1 g/dL - 4.8(L) 5.4(L)  Total Bilirubin 0.3 - 1.2 mg/dL - 0.5 0.4  Alkaline Phos 38 - 126 U/L - 49 57  AST 15 - 41 U/L - 31 50(H)  ALT 0 - 44 U/L - 47(H) 70(H)   CBC Latest Ref Rng & Units 04/09/2021 04/07/2021 03/22/2021  WBC 4.0 - 10.5 K/uL 6.3 5.1 9.4  Hemoglobin 13.0 - 17.0 g/dL 8.7(L) 8.7(L) 8.2(L)  Hematocrit 39.0 - 52.0 % 29.7(L) 29.8(L) 27.4(L)  Platelets 150 - 400 K/uL 183 177 443(H)    RADS: CLINICAL DATA:  66 year old male dialysis patient with left lower quadrant abdominal pain since tube removed last week. On antibiotics. Hypotension.   EXAM: CT ABDOMEN AND PELVIS WITH CONTRAST   TECHNIQUE: Multidetector CT imaging of the abdomen and pelvis was performed using the standard protocol following bolus administration of intravenous contrast.   RADIATION DOSE REDUCTION: This exam was performed according to the departmental dose-optimization program which includes automated exposure control, adjustment of the mA and/or kV according to patient size and/or use of iterative reconstruction technique.   CONTRAST:  43mL OMNIPAQUE IOHEXOL 300 MG/ML  SOLN   COMPARISON:  CT Abdomen and Pelvis 03/14/2021 and earlier.   FINDINGS: Lower chest: Left lower lobe atelectasis seen  last month has resolved. Prior sternotomy. No pericardial or pleural effusion. Stable borderline cardiomegaly.   Hepatobiliary: Stable liver since last year. Several small benign hepatic hemangiomas, most apparent at the liver dome on series 2, image 9. Questionable mild gallbladder wall thickening, although ascites is present and the gallbladder does not appear distended. No bile duct enlargement.   Pancreas: Negative.   Spleen: Stable and negative.   Adrenals/Urinary Tract: Adrenal glands remain within normal limits. Unchanged appearance of both kidneys since last year, partial renal atrophy and chronic hydronephrosis on the right. No renal contrast excretion on delayed images. Mildly distended urinary bladder.   Stomach/Bowel: Peritoneal dialysis catheter has been removed since last month. But there is increased free fluid in the peritoneal cavity, with simple fluid density (series 2 images 40 and 44.  No superimposed pneumoperitoneum.   Low-density retained stool throughout redundant distal large bowel. Low-density stool and gas throughout the transverse and descending colon. Fluid in the right colon. No convincing large bowel inflammation. Evidence of normal gas containing retrocecal appendix on coronal image 53.   Fluid containing small bowel in the lower abdomen and pelvis. Thick-walled proximal jejunum as on series 2, image 47. No transition point identified. Gas containing decompressed small bowel loops in the ventral abdomen. No definite pneumatosis.   Fluid-filled distal stomach.  Similar fluid-filled duodenum.   Small mesenteric ventral abdominal hernia again suspected on series 2, image 38. No herniated bowel.   Vascular/Lymphatic: Advanced Aortoiliac calcified atherosclerosis. Tortuous abdominal aorta. Major arterial structures in the abdomen and pelvis remain patent despite the atherosclerosis. Portal venous system is patent. No lymphadenopathy identified.    Reproductive: Negative.   Other: No pelvic free fluid.   Musculoskeletal: Prior sternotomy. Moderate thoracolumbar scoliosis. Mild lower lumbar spondylolisthesis with disc and facet degeneration. Previous instrumentation of the proximal right femur. Stable visualized osseous structures. No acute or suspicious osseous lesion identified.   IMPRESSION: 1. Peritoneal dialysis catheter has been removed since last month, but peritoneal Ascites has increased since that time. This is nonspecific but consider peritonitis in the setting of abdominal pain and hypotension.   2. Thick-walled proximal small bowel loops, nonspecific. No transition point or convincing bowel obstruction. Infectious enteritis remains possible.   3. Normal appendix. Retained low-density stool throughout the distal colon. No convincing large bowel inflammation.   4. Otherwise stable CT appearance of the abdomen and pelvis. Advanced Aortic Atherosclerosis (ICD10-I70.0).     Electronically Signed   By: Genevie Ann M.D.   On: 04/08/2021 05:21   Assessment:   Abdominal pain, possible bacterial peritonitis.   Plan:   Pain is too diffuse to be from incisional pain, and with no overlying skin changes at recent PD cath sites, along with chronicity of pain, doubt this is related to recent PD cath removal.  Initial workup should include paracentesis for fluid analysis. Surgical exploration at this time is excessive, especially with no obvious signs of tumors or other pathology beside peritoneal fluid on CT.  Further care per primary team at this point.  Surgery will peripherally follow.  Call with questions.  Ok to resume diet.

## 2021-04-10 NOTE — TOC Benefit Eligibility Note (Signed)
Patient Teacher, English as a foreign language completed.    The patient is currently admitted and upon discharge could be taking Entresto 24-26 mg.  The current 30 day co-pay is, $4.30.   The patient is insured through [place], [place] Patient Advocate Specialist [place] Patient Advocate Team Direct Number: [phone]  Fax: [phone]

## 2021-04-11 DIAGNOSIS — T8571XD Infection and inflammatory reaction due to peritoneal dialysis catheter, subsequent encounter: Secondary | ICD-10-CM | POA: Diagnosis not present

## 2021-04-11 DIAGNOSIS — U071 COVID-19: Secondary | ICD-10-CM | POA: Diagnosis not present

## 2021-04-11 DIAGNOSIS — N186 End stage renal disease: Secondary | ICD-10-CM | POA: Diagnosis not present

## 2021-04-11 LAB — URINALYSIS, ROUTINE W REFLEX MICROSCOPIC
Bacteria, UA: NONE SEEN
Bilirubin Urine: NEGATIVE
Glucose, UA: NEGATIVE mg/dL
Hgb urine dipstick: NEGATIVE
Ketones, ur: NEGATIVE mg/dL
Leukocytes,Ua: NEGATIVE
Nitrite: NEGATIVE
Protein, ur: 100 mg/dL — AB
Specific Gravity, Urine: 1.016 (ref 1.005–1.030)
pH: 6 (ref 5.0–8.0)

## 2021-04-11 LAB — RENAL FUNCTION PANEL
Albumin: 2.1 g/dL — ABNORMAL LOW (ref 3.5–5.0)
Anion gap: 15 (ref 5–15)
BUN: 54 mg/dL — ABNORMAL HIGH (ref 8–23)
CO2: 22 mmol/L (ref 22–32)
Calcium: 6.3 mg/dL — CL (ref 8.9–10.3)
Chloride: 105 mmol/L (ref 98–111)
Creatinine, Ser: 7.58 mg/dL — ABNORMAL HIGH (ref 0.61–1.24)
GFR, Estimated: 7 mL/min — ABNORMAL LOW (ref >60)
Glucose, Bld: 84 mg/dL (ref 70–99)
Phosphorus: 9.1 mg/dL — ABNORMAL HIGH (ref 2.5–4.6)
Potassium: 4.4 mmol/L (ref 3.5–5.1)
Sodium: 142 mmol/L (ref 135–145)

## 2021-04-11 MED ORDER — ALBUTEROL SULFATE HFA 108 (90 BASE) MCG/ACT IN AERS
2.0000 | INHALATION_SPRAY | RESPIRATORY_TRACT | Status: DC | PRN
  Filled 2021-04-11: qty 6.7

## 2021-04-11 MED ORDER — ACETAMINOPHEN 325 MG PO TABS
650.0000 mg | ORAL_TABLET | Freq: Four times a day (QID) | ORAL | Status: DC | PRN
  Administered 2021-04-11 – 2021-04-13 (×2): 650 mg via ORAL
  Filled 2021-04-11 (×3): qty 2

## 2021-04-11 MED ORDER — OXYCODONE HCL 5 MG PO TABS
5.0000 mg | ORAL_TABLET | Freq: Four times a day (QID) | ORAL | Status: DC | PRN
  Administered 2021-04-11 – 2021-04-12 (×4): 5 mg via ORAL
  Filled 2021-04-11 (×4): qty 1

## 2021-04-11 MED ORDER — SUCROFERRIC OXYHYDROXIDE 500 MG PO CHEW
1000.0000 mg | CHEWABLE_TABLET | Freq: Three times a day (TID) | ORAL | Status: DC
  Administered 2021-04-12 – 2021-04-17 (×12): 1000 mg via ORAL
  Filled 2021-04-11 (×18): qty 2

## 2021-04-11 NOTE — Progress Notes (Signed)
PROGRESS NOTE  Anthony Hess    DOB: 04-17-1955, 66 y.o.  NLG:[id number]    Code Status: Full Code   DOA: 04/08/2021   LOS: 3   Brief hospital course  Anthony Hess is a 66 y.o. male with a PMH significant for ESRD on PD previously and now HD TThS. They presented from home to the ED on 04/08/2021 with diffuse abdominal pain x 7 days. He had his non-functioning PD catheter removed about a week ago.  On another note, he has been receiving HD through a tunneled cath but his most recent session was shortened due to hypotension. In the ED, it was found that they had mild ascites, peritonitis on CT scan. Signs of enteritis also present.  They were treated with IV Abx and supportive care. Nephrology was consulted for routine HD.  Additionally, patient was found to be COVID-19 positive.  Patient was admitted to medicine service for further workup and management of peritonitis as outlined in detail below.  04/11/21 -stable, improved  Assessment & Plan  Principal Problem:   Peritonitis associated with peritoneal dialysis ([place]) Active Problems:   Anemia in ESRD (end-stage renal disease) ([facility])   Hypocalcemia   Hypokalemia   COVID-19 virus infection   Abdominal pain  Peritonitis- likely related to recent PD catheter removed prior to admission. Has previously been treated with vancomycin and ceftaz. Differential includes infectious vs malignancy.  - continue antibiotics  - CTX (2/15-  - vanc (2/15-  - got one dose zosyn 2/15 - IR consulted for diagnostic paracentesis  - planning to complete when off COVID isolation?. Incidental COVID finding so could end isolation after 5 days from initial positive test. Will plan to complete 2/20.  - consult ID to help guide antibiotic choice  Hypocalcemia- corrects to 7.8 with significant hypoalbuminemia  - continue to monitor - nephrology addressing with HD  Covid-19- mildly symptomatic. Stable ORA. Last day isolation precautions 2/19. - remdesivir  (2/15-2/17) - supportive care  ESRD on HD TThS- tunneled cath in place.  - management per nephrology - RFP on HD days  HTN- moderately well controlled - continue home hydralazine, metoprolol  - transition to entresto  Body mass index is 21.05 kg/m.  VTE ppx: heparin injection 5,000 Units Start: 04/08/21 0945   Diet:     Diet   Diet renal with fluid restriction Fluid restriction: 1200 mL Fluid; Room service appropriate? Yes; Fluid consistency: Thin   Subjective 04/11/21    Pt reports doing well today. He has no complaints. Persistent mild abdominal pain.   Objective   Vitals:   04/10/21 1308 04/10/21 2005 04/11/21 0329 04/11/21 0659  BP: 130/88 (!) 150/97 (!) 132/100 (!) 151/103  Pulse: 73 73 71   Resp:  16 16   Temp: 98 F (36.7 C) 97.6 F (36.4 C) 97.9 F (36.6 C)   TempSrc: Oral Oral Oral   SpO2: 99% 100% 98%   Weight:      Height:        Intake/Output Summary (Last 24 hours) at 04/11/2021 0733 Last data filed at 04/11/2021 0316 Gross per 24 hour  Intake 240 ml  Output 220 ml  Net 20 ml    Filed Weights   04/08/21 1443 04/09/21 1337 04/09/21 1646  Weight: 52.9 kg 55.6 kg 53.9 kg     Physical Exam:  General: awake, alert, NAD HEENT: atraumatic, clear conjunctiva, anicteric sclera, MMM, hearing grossly normal Respiratory: normal respiratory effort. Cardiovascular: normal S1/S2, RRR, no JVD, murmurs, quick capillary refill  Gastrointestinal: soft, enlarged, mildly tender generalized Nervous: A&O x3. no gross focal neurologic deficits, normal speech Extremities: moves all equally, no edema, normal tone Skin: dry, intact, normal temperature, normal color. No rashes, lesions or ulcers on exposed skin Psychiatry: normal mood, congruent affect  Labs   I have personally reviewed the following labs and imaging studies CBC    Component Value Date/Time   WBC 6.3 04/09/2021 0352   RBC 3.28 (L) 04/09/2021 0352   HGB 8.7 (L) 04/09/2021 0352   HGB 9.1 (L)  01/04/2020 1540   HCT 29.7 (L) 04/09/2021 0352   HCT 29.6 (L) 01/04/2020 1540   PLT 183 04/09/2021 0352   PLT 191 01/04/2020 1540   MCV 90.5 04/09/2021 0352   MCV 94 01/04/2020 1540   MCH 26.5 04/09/2021 0352   MCHC 29.3 (L) 04/09/2021 0352   RDW 17.6 (H) 04/09/2021 0352   RDW 16.2 (H) 01/04/2020 1540   LYMPHSABS 0.9 03/14/2021 2112   LYMPHSABS 1.0 01/04/2020 1540   MONOABS 1.4 (H) 03/14/2021 2112   EOSABS 0.2 03/14/2021 2112   EOSABS 0.5 (H) 03/27/2018 1643   BASOSABS 0.0 03/14/2021 2112   BASOSABS 0.0 03/27/2018 1643   BMP Latest Ref Rng & Units 04/10/2021 04/09/2021 04/07/2021  Glucose 70 - 99 mg/dL 100(H) 72 99  BUN 8 - 23 mg/dL 33(H) 41(H) 23  Creatinine 0.61 - 1.24 mg/dL 5.36(H) 6.30(H) 4.41(H)  BUN/Creat Ratio 10 - 24 - - -  Sodium 135 - 145 mmol/L 140 139 139  Potassium 3.5 - 5.1 mmol/L 4.5 4.4 2.9(L)  Chloride 98 - 111 mmol/L 103 104 104  CO2 22 - 32 mmol/L 28 22 27   Calcium 8.9 - 10.3 mg/dL 6.9(L) 5.7(LL) 5.9(LL)    Disposition Plan & Communication  Patient status: Inpatient  Admitted From: Home Planned disposition location: Home Anticipated discharge date: TBD pending improvement in peritonitis. Potentially after thoracentesis 2/20  Family Communication: none    Author: Richarda Osmond, DO [facility] 04/11/2021, 7:33 AM   Available by Epic secure chat 7AM-7PM. If 7PM-7AM, please contact night-coverage.  [facility] contact information found on [email or web address].

## 2021-04-11 NOTE — Progress Notes (Signed)
[facility]  ROUNDING NOTE   Subjective:   Anthony Hess is a 66 year old male with past medical conditions including bladder cancer, CAD, hypertension, and end-stage renal disease on hemodialysis.  Patient presents to the emergency department with complaints of abdominal pain.  Patient has been admitted for Hypocalcemia [E83.51] Hypokalemia [E87.6] SBP (spontaneous bacterial peritonitis) ([place]) [K65.2] Peritonitis associated with peritoneal dialysis ([place]) [T85.71XA] Abdominal pain, unspecified abdominal location [R10.9]  Patient is known to our practice and receives outpatient dialysis treatments at [facility] on a TTS schedule, supervised by Dr. Holley Raring.    Patient resting comfortably in the bed.  Denies shortness of breath.  Stated that he feels better today.  Patient is scheduled to have a paracentesis on Monday. Denies any abdominal pain today  Scheduled for dialysis today l   Objective:  Vital signs in last 24 hours:  Temp:  [97.4 F (36.3 C)-97.9 F (36.6 C)] 97.4 F (36.3 C) (02/18 0817) Pulse Rate:  [69-73] 69 (02/18 0817) Resp:  [16-20] 20 (02/18 0817) BP: (132-151)/(94-103) 136/94 (02/18 0817) SpO2:  [96 %-100 %] 96 % (02/18 0817)  Weight change:  Filed Weights   04/08/21 1443 04/09/21 1337 04/09/21 1646  Weight: 52.9 kg 55.6 kg 53.9 kg    Intake/Output: I/O last 3 completed shifts: In: 240 [P.O.:240] Out: 220 [Urine:220]   Intake/Output this shift:  No intake/output data recorded.  Physical Exam: General: NAD, resting in bed  Head: Normocephalic, atraumatic. Moist oral mucosal membranes  Eyes: Anicteric  Lungs:  Clear to auscultation, normal effort  Heart: Regular rate and rhythm  Abdomen:  Soft, tender, mild distention  Extremities: No peripheral edema.  Neurologic: Nonfocal, moving all four extremities  Skin: No lesions  Access: Right IJ PermCath    Basic Metabolic Panel: Recent Labs  Lab 04/07/21 2258 04/09/21 0352  04/10/21 0434 04/11/21 0821  NA 139 139 140 142  K 2.9* 4.4 4.5 4.4  CL 104 104 103 105  CO2 27 22 28 22   GLUCOSE 99 72 100* 84  BUN 23 41* 33* 54*  CREATININE 4.41* 6.30* 5.36* 7.58*  CALCIUM 5.9* 5.7* 6.9* 6.3*  MG  --  2.0  --   --   PHOS  --   --  6.4* 9.1*     Liver Function Tests: Recent Labs  Lab 04/07/21 2258 04/09/21 0352 04/10/21 0434 04/11/21 0821  AST 50* 31  --   --   ALT 70* 47*  --   --   ALKPHOS 57 49  --   --   BILITOT 0.4 0.5  --   --   PROT 5.4* 4.8*  --   --   ALBUMIN 2.3* 2.0* 2.1* 2.1*    Recent Labs  Lab 04/07/21 2258  LIPASE 47    No results for input(s): AMMONIA in the last 168 hours.  CBC: Recent Labs  Lab 04/07/21 2258 04/09/21 0352  WBC 5.1 6.3  HGB 8.7* 8.7*  HCT 29.8* 29.7*  MCV 90.0 90.5  PLT 177 183     Cardiac Enzymes: No results for input(s): CKTOTAL, CKMB, CKMBINDEX, TROPONINI in the last 168 hours.  BNP: Invalid input(s): POCBNP  CBG: No results for input(s): GLUCAP in the last 168 hours.  Microbiology: Results for orders placed or performed during the hospital encounter of 04/08/21  Culture, blood (routine x 2)     Status: None (Preliminary result)   Collection Time: 04/08/21  5:45 AM   Specimen: BLOOD  Result Value Ref Range Status  Specimen Description BLOOD LEFT HAND  Final   Special Requests   Final    BOTTLES DRAWN AEROBIC AND ANAEROBIC Blood Culture results may not be optimal due to an excessive volume of blood received in culture bottles   Culture   Final    NO GROWTH 3 DAYS Performed at [facility], [address], [place], [place] [address]    Report Status PENDING  Incomplete  Resp Panel by RT-PCR (Flu A&B, Covid) Nasopharyngeal Swab     Status: Abnormal   Collection Time: 04/08/21  5:45 AM   Specimen: Nasopharyngeal Swab; Nasopharyngeal(NP) swabs in vial transport medium  Result Value Ref Range Status   SARS Coronavirus 2 by RT PCR POSITIVE (A) NEGATIVE Final    Comment:  (NOTE) SARS-CoV-2 target nucleic acids are DETECTED.  The SARS-CoV-2 RNA is generally detectable in upper respiratory specimens during the acute phase of infection. Positive results are indicative of the presence of the identified virus, but do not rule out bacterial infection or co-infection with other pathogens not detected by the test. Clinical correlation with patient history and other diagnostic information is necessary to determine patient infection status. The expected result is Negative.  Fact Sheet for Patients: EntrepreneurPulse.com.au  Fact Sheet for Healthcare Providers: IncredibleEmployment.be  This test is not yet approved or cleared by the [place] FDA and  has been authorized for detection and/or diagnosis of SARS-CoV-2 by FDA under an Emergency Use Authorization (EUA).  This EUA will remain in effect (meaning this test can be used) for the duration of  the COVID-19 declaration under Section 564(b)(1) of the A ct, 21 U.S.C. section 360bbb-3(b)(1), unless the authorization is terminated or revoked sooner.     Influenza A by PCR NEGATIVE NEGATIVE Final   Influenza B by PCR NEGATIVE NEGATIVE Final    Comment: (NOTE) The Xpert Xpress SARS-CoV-2/FLU/RSV plus assay is intended as an aid in the diagnosis of influenza from Nasopharyngeal swab specimens and should not be used as a sole basis for treatment. Nasal washings and aspirates are unacceptable for Xpert Xpress SARS-CoV-2/FLU/RSV testing.  Fact Sheet for Patients: EntrepreneurPulse.com.au  Fact Sheet for Healthcare Providers: IncredibleEmployment.be  This test is not yet approved or cleared by the [place] FDA and has been authorized for detection and/or diagnosis of SARS-CoV-2 by FDA under an Emergency Use Authorization (EUA). This EUA will remain in effect (meaning this test can be used) for the duration of the COVID-19  declaration under Section 564(b)(1) of the Act, 21 U.S.C. section 360bbb-3(b)(1), unless the authorization is terminated or revoked.  Performed at [facility], [address] [place], [place] [address]   Culture, blood (routine x 2)     Status: None (Preliminary result)   Collection Time: 04/08/21  5:46 AM   Specimen: BLOOD  Result Value Ref Range Status   Specimen Description BLOOD RIGHT HAND  Final   Special Requests   Final    BOTTLES DRAWN AEROBIC AND ANAEROBIC Blood Culture adequate volume   Culture   Final    NO GROWTH 3 DAYS Performed at [facility], [address] [place], [place] [address]    Report Status PENDING  Incomplete  MRSA Next Gen by PCR, Nasal     Status: None   Collection Time: 04/08/21  3:16 PM   Specimen: Nasal Mucosa; Nasal Swab  Result Value Ref Range Status   MRSA by PCR Next Gen NOT DETECTED NOT DETECTED Final    Comment: (NOTE) The GeneXpert MRSA  Assay (FDA approved for NASAL specimens only), is one component of a comprehensive MRSA colonization surveillance program. It is not intended to diagnose MRSA infection nor to guide or monitor treatment for MRSA infections. Test performance is not FDA approved in patients less than 38 years old. Performed at [facility], [address] [place], [place] [address]     Coagulation Studies: No results for input(s): LABPROT, INR in the last 72 hours.  Urinalysis: No results for input(s): COLORURINE, LABSPEC, PHURINE, GLUCOSEU, HGBUR, BILIRUBINUR, KETONESUR, PROTEINUR, UROBILINOGEN, NITRITE, LEUKOCYTESUR in the last 72 hours.  Invalid input(s): APPERANCEUR    Imaging: No results found.   Medications:    sodium chloride     cefTRIAXone (ROCEPHIN)  IV 2 g (04/11/21 1348)   vancomycin Stopped (04/09/21 1618)    vitamin C  500 mg Oral Daily   aspirin EC  81 mg Oral Daily   atorvastatin  40 mg Oral Daily   calcitRIOL  0.5 mcg Oral Daily   calcium carbonate  1,250  mg Oral BID WC   Chlorhexidine Gluconate Cloth  6 each Topical Daily   feeding supplement (NEPRO CARB STEADY)  237 mL Oral TID BM   heparin injection (subcutaneous)  5,000 Units Subcutaneous Q8H   hydrALAZINE  10 mg Oral Q8H   magnesium oxide  400 mg Oral Daily   metoprolol succinate  50 mg Oral Daily   sacubitril-valsartan  1 tablet Oral BID   sucroferric oxyhydroxide  1,000 mg Oral TID   zinc sulfate  220 mg Oral Daily   sodium chloride, acetaminophen, albuterol, guaiFENesin-dextromethorphan, ondansetron **OR** ondansetron (ZOFRAN) IV, oxyCODONE  Assessment/ Plan:  Mr. Tyreck Bell is a 66 y.o.  male with past medical conditions including bladder cancer, CAD, hypertension, and end-stage renal disease on hemodialysis.  Patient presents to the emergency department with complaints of abdominal pain.  Patient has been admitted for Hypocalcemia [E83.51] Hypokalemia [E87.6] SBP (spontaneous bacterial peritonitis) ([place]) [K65.2] Peritonitis associated with peritoneal dialysis ([place]) [T85.71XA] Abdominal pain, unspecified abdominal location [R10.9]  [facility] [place]/TTS/ Rt Permcath  End-stage renal disease on hemodialysis.  Will maintain outpatient schedule if possible. Scheduled to have HD today.    2. Anemia of chronic kidney disease Normocytic  Lab Results  Component Value Date   HGB 8.7 (L) 04/09/2021    Hemoglobin below desired target, Micera ordered outpatient We will continue to monitor  3. Secondary Hyperparathyroidism:  Lab Results  Component Value Date   PTH 159 (H) 12/27/2019   CALCIUM 6.3 (LL) 04/11/2021   CAION 0.74 (LL) 01/20/2021   PHOS 9.1 (H) 04/11/2021  Phos today 9.1. Continue Vitamin D and Velphoro along with calcium carbonate twice daily  4.  Hypertension with chronic kidney disease.  Outpatient regimen includes hydralazine, irbesartan, and metoprolol. Irbesartan held at this time. Now on Entresto. BP 130/88  5.  Peritonitis associated with peritoneal dialysis.   Empirically placed on vancomycin and Rocephin. . 6.  COVID-19 positive Positive finding on 04/08/2021.  Currently receiving remdesivir and supportive care therapies.   LOS: 3 Jiraiya Mcewan P Macarthur Lorusso 2/18/20232:24 PM

## 2021-04-12 DIAGNOSIS — T8571XD Infection and inflammatory reaction due to peritoneal dialysis catheter, subsequent encounter: Secondary | ICD-10-CM | POA: Diagnosis not present

## 2021-04-12 DIAGNOSIS — R109 Unspecified abdominal pain: Secondary | ICD-10-CM | POA: Diagnosis not present

## 2021-04-12 DIAGNOSIS — U071 COVID-19: Secondary | ICD-10-CM | POA: Diagnosis not present

## 2021-04-12 MED ORDER — HYDROMORPHONE HCL 1 MG/ML IJ SOLN
0.5000 mg | Freq: Once | INTRAMUSCULAR | Status: AC
Start: 2021-04-12 — End: 2021-04-12
  Administered 2021-04-12: 0.5 mg via INTRAVENOUS
  Filled 2021-04-12: qty 0.5

## 2021-04-12 NOTE — Progress Notes (Signed)
[facility]  ROUNDING NOTE   Subjective:   Anthony Hess is a 66 year old male with past medical conditions including bladder cancer, CAD, hypertension, and end-stage renal disease on hemodialysis.  Patient presents to the emergency department with complaints of abdominal pain.  Patient has been admitted for Hypocalcemia [E83.51] Hypokalemia [E87.6] SBP (spontaneous bacterial peritonitis) ([place]) [K65.2] Peritonitis associated with peritoneal dialysis ([place]) [T85.71XA] Abdominal pain, unspecified abdominal location [R10.9]  Patient is known to our practice and receives outpatient dialysis treatments at [facility] on a TTS schedule, supervised by Dr. Holley Raring.    Patient resting comfortably in the bed.  Denies shortness of breath.  Verbalized that he is ready to go home.  He is scheduled to have a paracentesis on tomorrow. Denies any abdominal pain or shortness of breath. .    Objective:  Vital signs in last 24 hours:  Temp:  [97.8 F (36.6 C)-98.6 F (37 C)] 98 F (36.7 C) (02/19 0900) Pulse Rate:  [58-102] 80 (02/19 0900) Resp:  [10-25] 19 (02/19 0900) BP: (99-143)/(72-103) 116/82 (02/19 0900) SpO2:  [94 %-100 %] 100 % (02/19 0900) Weight:  [55.2 kg-55.3 kg] 55.3 kg (02/18 1905)  Weight change:  Filed Weights   04/09/21 1646 04/11/21 1553 04/11/21 1905  Weight: 53.9 kg 55.2 kg 55.3 kg    Intake/Output: I/O last 3 completed shifts: In: 480 [P.O.:480] Out: 1248 [Urine:200; CWCBJ:6283]   Intake/Output this shift:  No intake/output data recorded.  Physical Exam: General: NAD, resting in bed  Head: Normocephalic, atraumatic. Moist oral mucosal membranes  Eyes: Anicteric  Lungs:  Clear to auscultation, normal effort  Heart: Regular rate and rhythm  Abdomen:  Soft, tender, mild distention  Extremities: No peripheral edema.  Neurologic: Nonfocal, moving all four extremities  Skin: No lesions  Access: Right IJ PermCath    Basic Metabolic Panel: Recent  Labs  Lab 04/07/21 2258 04/09/21 0352 04/10/21 0434 04/11/21 0821  NA 139 139 140 142  K 2.9* 4.4 4.5 4.4  CL 104 104 103 105  CO2 27 22 28 22   GLUCOSE 99 72 100* 84  BUN 23 41* 33* 54*  CREATININE 4.41* 6.30* 5.36* 7.58*  CALCIUM 5.9* 5.7* 6.9* 6.3*  MG  --  2.0  --   --   PHOS  --   --  6.4* 9.1*     Liver Function Tests: Recent Labs  Lab 04/07/21 2258 04/09/21 0352 04/10/21 0434 04/11/21 0821  AST 50* 31  --   --   ALT 70* 47*  --   --   ALKPHOS 57 49  --   --   BILITOT 0.4 0.5  --   --   PROT 5.4* 4.8*  --   --   ALBUMIN 2.3* 2.0* 2.1* 2.1*    Recent Labs  Lab 04/07/21 2258  LIPASE 47    No results for input(s): AMMONIA in the last 168 hours.  CBC: Recent Labs  Lab 04/07/21 2258 04/09/21 0352  WBC 5.1 6.3  HGB 8.7* 8.7*  HCT 29.8* 29.7*  MCV 90.0 90.5  PLT 177 183     Cardiac Enzymes: No results for input(s): CKTOTAL, CKMB, CKMBINDEX, TROPONINI in the last 168 hours.  BNP: Invalid input(s): POCBNP  CBG: No results for input(s): GLUCAP in the last 168 hours.  Microbiology: Results for orders placed or performed during the hospital encounter of 04/08/21  Culture, blood (routine x 2)     Status: None (Preliminary result)   Collection Time: 04/08/21  5:45  AM   Specimen: BLOOD  Result Value Ref Range Status   Specimen Description BLOOD LEFT HAND  Final   Special Requests   Final    BOTTLES DRAWN AEROBIC AND ANAEROBIC Blood Culture results may not be optimal due to an excessive volume of blood received in culture bottles   Culture   Final    NO GROWTH 4 DAYS Performed at [facility], [address], [place], [place] [address]    Report Status PENDING  Incomplete  Resp Panel by RT-PCR (Flu A&B, Covid) Nasopharyngeal Swab     Status: Abnormal   Collection Time: 04/08/21  5:45 AM   Specimen: Nasopharyngeal Swab; Nasopharyngeal(NP) swabs in vial transport medium  Result Value Ref Range Status   SARS Coronavirus 2 by RT PCR  POSITIVE (A) NEGATIVE Final    Comment: (NOTE) SARS-CoV-2 target nucleic acids are DETECTED.  The SARS-CoV-2 RNA is generally detectable in upper respiratory specimens during the acute phase of infection. Positive results are indicative of the presence of the identified virus, but do not rule out bacterial infection or co-infection with other pathogens not detected by the test. Clinical correlation with patient history and other diagnostic information is necessary to determine patient infection status. The expected result is Negative.  Fact Sheet for Patients: EntrepreneurPulse.com.au  Fact Sheet for Healthcare Providers: IncredibleEmployment.be  This test is not yet approved or cleared by the [place] FDA and  has been authorized for detection and/or diagnosis of SARS-CoV-2 by FDA under an Emergency Use Authorization (EUA).  This EUA will remain in effect (meaning this test can be used) for the duration of  the COVID-19 declaration under Section 564(b)(1) of the A ct, 21 U.S.C. section 360bbb-3(b)(1), unless the authorization is terminated or revoked sooner.     Influenza A by PCR NEGATIVE NEGATIVE Final   Influenza B by PCR NEGATIVE NEGATIVE Final    Comment: (NOTE) The Xpert Xpress SARS-CoV-2/FLU/RSV plus assay is intended as an aid in the diagnosis of influenza from Nasopharyngeal swab specimens and should not be used as a sole basis for treatment. Nasal washings and aspirates are unacceptable for Xpert Xpress SARS-CoV-2/FLU/RSV testing.  Fact Sheet for Patients: EntrepreneurPulse.com.au  Fact Sheet for Healthcare Providers: IncredibleEmployment.be  This test is not yet approved or cleared by the [place] FDA and has been authorized for detection and/or diagnosis of SARS-CoV-2 by FDA under an Emergency Use Authorization (EUA). This EUA will remain in effect (meaning this test can be used)  for the duration of the COVID-19 declaration under Section 564(b)(1) of the Act, 21 U.S.C. section 360bbb-3(b)(1), unless the authorization is terminated or revoked.  Performed at [facility], [address] [place], [place] [address]   Culture, blood (routine x 2)     Status: None (Preliminary result)   Collection Time: 04/08/21  5:46 AM   Specimen: BLOOD  Result Value Ref Range Status   Specimen Description BLOOD RIGHT HAND  Final   Special Requests   Final    BOTTLES DRAWN AEROBIC AND ANAEROBIC Blood Culture adequate volume   Culture   Final    NO GROWTH 4 DAYS Performed at [facility], [address], [place], [place] [address]    Report Status PENDING  Incomplete  MRSA Next Gen by PCR, Nasal     Status: None   Collection Time: 04/08/21  3:16 PM   Specimen: Nasal Mucosa; Nasal Swab  Result Value Ref Range Status   MRSA by PCR Next Gen  NOT DETECTED NOT DETECTED Final    Comment: (NOTE) The GeneXpert MRSA Assay (FDA approved for NASAL specimens only), is one component of a comprehensive MRSA colonization surveillance program. It is not intended to diagnose MRSA infection nor to guide or monitor treatment for MRSA infections. Test performance is not FDA approved in patients less than 33 years old. Performed at [facility], [address] [place], [place] [address]     Coagulation Studies: No results for input(s): LABPROT, INR in the last 72 hours.  Urinalysis: Recent Labs    04/11/21 0316  COLORURINE YELLOW*  LABSPEC 1.016  PHURINE 6.0  GLUCOSEU NEGATIVE  HGBUR NEGATIVE  BILIRUBINUR NEGATIVE  KETONESUR NEGATIVE  PROTEINUR 100*  NITRITE NEGATIVE  LEUKOCYTESUR NEGATIVE       Imaging: No results found.   Medications:    sodium chloride     cefTRIAXone (ROCEPHIN)  IV 2 g (04/12/21 1146)   vancomycin 500 mg (04/11/21 1747)    vitamin C  500 mg Oral Daily   aspirin EC  81 mg Oral Daily   atorvastatin  40 mg Oral Daily    calcitRIOL  0.5 mcg Oral Daily   calcium carbonate  1,250 mg Oral BID WC   Chlorhexidine Gluconate Cloth  6 each Topical Daily   feeding supplement (NEPRO CARB STEADY)  237 mL Oral TID BM   heparin injection (subcutaneous)  5,000 Units Subcutaneous Q8H   hydrALAZINE  10 mg Oral Q8H   magnesium oxide  400 mg Oral Daily   metoprolol succinate  50 mg Oral Daily   sacubitril-valsartan  1 tablet Oral BID   sucroferric oxyhydroxide  1,000 mg Oral TID WC   zinc sulfate  220 mg Oral Daily   sodium chloride, acetaminophen, albuterol, guaiFENesin-dextromethorphan, ondansetron **OR** ondansetron (ZOFRAN) IV, oxyCODONE  Assessment/ Plan:  [name] is a 66 y.o.  male with past medical conditions including bladder cancer, CAD, hypertension, and end-stage renal disease on hemodialysis.  Patient presents to the emergency department with complaints of abdominal pain.  Patient has been admitted for Hypocalcemia [E83.51] Hypokalemia [E87.6] SBP (spontaneous bacterial peritonitis) ([place]) [K65.2] Peritonitis associated with peritoneal dialysis ([place]) [T85.71XA] Abdominal pain, unspecified abdominal location [R10.9]  [facility] [place]/TTS/ Rt Permcath  End-stage renal disease on hemodialysis.  Will maintain outpatient schedule if possible. HD on TTS.  2. Anemia of chronic kidney disease Normocytic  Lab Results  Component Value Date   HGB 8.7 (L) 04/09/2021    Hemoglobin below desired target, Micera ordered outpatient We will continue to monitor  3. Secondary Hyperparathyroidism:  Lab Results  Component Value Date   PTH 159 (H) 12/27/2019   CALCIUM 6.3 (LL) 04/11/2021   CAION 0.74 (LL) 01/20/2021   PHOS 9.1 (H) 04/11/2021  F/U phosphorus levels. continue Vitamin D and Velphoro along with calcium carbonate twice daily.  Latest calcium level was 6.3 follow-up labs in a.m.  4.  Hypertension with chronic kidney disease.  Outpatient regimen includes hydralazine, irbesartan, and metoprolol.  Irbesartan held at this time. Now on Entresto. BP 130/88  5.  Peritonitis associated with peritoneal dialysis. Continue vancomycin and Rocephin. ID recommended paracentesis. IR consulted for diagnostic paracentesis, possibly in a.m. . 6.  COVID-19 positive Positive finding on 04/08/2021.  S/P remdesivir and continue  supportive care therapies.   LOS: 4 Regina Coppolino P Paxten Appelt 2/19/20232:25 PM

## 2021-04-12 NOTE — Progress Notes (Signed)
PROGRESS NOTE  Anthony Hess    DOB: January 17, 1956, 66 y.o.  ZOX:[id number]    Code Status: Full Code   DOA: 04/08/2021   LOS: 4   Brief hospital course  Anthony Hess is a 66 y.o. male with a PMH significant for ESRD on PD previously and now HD TThS. They presented from home to the ED on 04/08/2021 with diffuse abdominal pain x 7 days. He had his non-functioning PD catheter removed about a week ago.  On another note, he has been receiving HD through a tunneled cath but his most recent session was shortened due to hypotension. In the ED, it was found that they had mild ascites, peritonitis on CT scan. Signs of enteritis also present.  They were treated with IV Abx and supportive care. Nephrology was consulted for routine HD.  Additionally, patient was found to be COVID-19 positive.  Patient was admitted to medicine service for further workup and management of peritonitis as outlined in detail below.  04/12/21 -stable, improved  Assessment & Plan  Principal Problem:   Peritonitis associated with peritoneal dialysis ([place]) Active Problems:   Anemia in ESRD (end-stage renal disease) (HCC)   Hypocalcemia   Hypokalemia   COVID-19 virus infection   Abdominal pain  Peritonitis- likely related to recent PD catheter removed prior to admission. Has previously been treated with vancomycin and ceftaz. Differential includes infectious vs malignancy.  - continue antibiotics  - CTX (2/15-  - vanc (2/15-  - got one dose zosyn 2/15 - IR consulted for diagnostic paracentesis  - planning to complete when off COVID isolation?. Incidental COVID finding so could end isolation after 5 days from initial positive test. Will plan to complete 2/20.   - ID recommended paracentesis to help rule out TB vs carcinomatosis peritonitis as patient has refractory illness - ID consulted to help guide antibiotics, appreciate recs  Hypocalcemia- corrects to 7.8 with significant hypoalbuminemia  - continue to monitor -  nephrology addressing with HD  Covid-19- mildly symptomatic. Stable ORA. Last day isolation precautions 2/19. - remdesivir (2/15-2/17) - supportive care  ESRD on HD TThS- tunneled cath in place.  - management per nephrology - RFP on HD days  HTN- moderately well controlled - continue home hydralazine, metoprolol  - transitioned to entresto  Body mass index is 21.6 kg/m.  VTE ppx: heparin injection 5,000 Units Start: 04/08/21 0945  Diet:     Diet   Diet renal with fluid restriction Fluid restriction: 1200 mL Fluid; Room service appropriate? Yes; Fluid consistency: Thin   Subjective 04/12/21    Pt reports doing well today. Mild abdominal pain. Tolerating diet well.   Objective   Vitals:   04/11/21 1905 04/11/21 1940 04/11/21 2237 04/12/21 0426  BP:  108/81 106/85 120/83  Pulse:  (!) 102  78  Resp: (!) 24 16  16   Temp: 98.6 F (37 C) 98.1 F (36.7 C)  97.9 F (36.6 C)  TempSrc: Oral Oral  Oral  SpO2:  94%  100%  Weight: 55.3 kg     Height:        Intake/Output Summary (Last 24 hours) at 04/12/2021 8119 Last data filed at 04/11/2021 2246 Gross per 24 hour  Intake 240 ml  Output 1048 ml  Net -808 ml    Filed Weights   04/09/21 1646 04/11/21 1553 04/11/21 1905  Weight: 53.9 kg 55.2 kg 55.3 kg     Physical Exam:  General: awake, alert, NAD HEENT: atraumatic, clear conjunctiva, anicteric sclera, MMM, hearing  grossly normal Respiratory: normal respiratory effort. Cardiovascular: normal S1/S2, RRR, no JVD, murmurs, quick capillary refill  Gastrointestinal: soft, enlarged, mildly tender generalized Nervous: A&O x3. no gross focal neurologic deficits, normal speech Extremities: moves all equally, no edema, normal tone Skin: dry, intact, normal temperature, normal color. No rashes, lesions or ulcers on exposed skin Psychiatry: normal mood, congruent affect  Labs   I have personally reviewed the following labs and imaging studies CBC    Component Value  Date/Time   WBC 6.3 04/09/2021 0352   RBC 3.28 (L) 04/09/2021 0352   HGB 8.7 (L) 04/09/2021 0352   HGB 9.1 (L) 01/04/2020 1540   HCT 29.7 (L) 04/09/2021 0352   HCT 29.6 (L) 01/04/2020 1540   PLT 183 04/09/2021 0352   PLT 191 01/04/2020 1540   MCV 90.5 04/09/2021 0352   MCV 94 01/04/2020 1540   MCH 26.5 04/09/2021 0352   MCHC 29.3 (L) 04/09/2021 0352   RDW 17.6 (H) 04/09/2021 0352   RDW 16.2 (H) 01/04/2020 1540   LYMPHSABS 0.9 03/14/2021 2112   LYMPHSABS 1.0 01/04/2020 1540   MONOABS 1.4 (H) 03/14/2021 2112   EOSABS 0.2 03/14/2021 2112   EOSABS 0.5 (H) 03/27/2018 1643   BASOSABS 0.0 03/14/2021 2112   BASOSABS 0.0 03/27/2018 1643   BMP Latest Ref Rng & Units 04/11/2021 04/10/2021 04/09/2021  Glucose 70 - 99 mg/dL 84 100(H) 72  BUN 8 - 23 mg/dL 54(H) 33(H) 41(H)  Creatinine 0.61 - 1.24 mg/dL 7.58(H) 5.36(H) 6.30(H)  BUN/Creat Ratio 10 - 24 - - -  Sodium 135 - 145 mmol/L 142 140 139  Potassium 3.5 - 5.1 mmol/L 4.4 4.5 4.4  Chloride 98 - 111 mmol/L 105 103 104  CO2 22 - 32 mmol/L 22 28 22   Calcium 8.9 - 10.3 mg/dL 6.3(LL) 6.9(L) 5.7(LL)    Disposition Plan & Communication  Patient status: Inpatient  Admitted From: Home Planned disposition location: Home Anticipated discharge date: TBD pending improvement in peritonitis. Potentially after thoracentesis 2/20  Family Communication: none    Author: Richarda Osmond, DO [facility] 04/12/2021, 7:12 AM   Available by Epic secure chat 7AM-7PM. If 7PM-7AM, please contact night-coverage.  [facility] contact information found on [email or web address].

## 2021-04-12 NOTE — Progress Notes (Signed)
Patient was told by attending during day shift that he would come off Isolation today. No orders entered regarding this. According to [facility], he's not due to come off isolation until 2/21. Test was done on 2/15, count of days starts on next day which is 2/16 and is 5 days total if asymptomatic, which he is. Should come off isolation on 2/21 or until order to discontinue is placed.

## 2021-04-13 ENCOUNTER — Inpatient Hospital Stay: Payer: Medicare Other

## 2021-04-13 DIAGNOSIS — R109 Unspecified abdominal pain: Secondary | ICD-10-CM

## 2021-04-13 DIAGNOSIS — T8571XD Infection and inflammatory reaction due to peritoneal dialysis catheter, subsequent encounter: Secondary | ICD-10-CM | POA: Diagnosis not present

## 2021-04-13 LAB — CULTURE, BLOOD (ROUTINE X 2)
Culture: NO GROWTH
Culture: NO GROWTH
Special Requests: ADEQUATE

## 2021-04-13 MED ORDER — OXYCODONE HCL 5 MG PO TABS
10.0000 mg | ORAL_TABLET | Freq: Four times a day (QID) | ORAL | Status: DC | PRN
  Administered 2021-04-13 – 2021-04-16 (×11): 10 mg via ORAL
  Filled 2021-04-13 (×13): qty 2

## 2021-04-13 MED ORDER — EPOETIN ALFA 10000 UNIT/ML IJ SOLN: 10000.0000 [IU] | INTRAMUSCULAR | Status: DC

## 2021-04-13 NOTE — Progress Notes (Signed)
Continues to have abdominal pain.  O/e  Awake and alert No no distress at rest Patient Vitals for the past 24 hrs:  BP Temp Temp src Pulse Resp SpO2  04/13/21 0828 (!) 151/97 98 F (36.7 C) Oral 62 -- 96 %  04/13/21 0526 124/88 98.1 F (36.7 C) Oral 70 20 100 %  04/12/21 2016 115/82 98.1 F (36.7 C) -- 75 18 98 %  04/12/21 1633 119/85 98.3 F (36.8 C) Oral 75 18 95 %   Chest b/l air entry [id number] Abd soft tenderness over the lower abdomen at the site of the prior surgical scar  RT IJ HD cath CNS nonfocal  Labs CBC Latest Ref Rng & Units 04/09/2021 04/07/2021 03/22/2021  WBC 4.0 - 10.5 K/uL 6.3 5.1 9.4  Hemoglobin 13.0 - 17.0 g/dL 8.7(L) 8.7(L) 8.2(L)  Hematocrit 39.0 - 52.0 % 29.7(L) 29.8(L) 27.4(L)  Platelets 150 - 400 K/uL 183 177 443(H)    CMP Latest Ref Rng & Units 04/11/2021 04/10/2021 04/09/2021  Glucose 70 - 99 mg/dL 84 100(H) 72  BUN 8 - 23 mg/dL 54(H) 33(H) 41(H)  Creatinine 0.61 - 1.24 mg/dL 7.58(H) 5.36(H) 6.30(H)  Sodium 135 - 145 mmol/L 142 140 139  Potassium 3.5 - 5.1 mmol/L 4.4 4.5 4.4  Chloride 98 - 111 mmol/L 105 103 104  CO2 22 - 32 mmol/L 22 28 22   Calcium 8.9 - 10.3 mg/dL 6.3(LL) 6.9(L) 5.7(LL)  Total Protein 6.5 - 8.1 g/dL - - 4.8(L)  Total Bilirubin 0.3 - 1.2 mg/dL - - 0.5  Alkaline Phos 38 - 126 U/L - - 49  AST 15 - 41 U/L - - 31  ALT 0 - 44 U/L - - 47(H)     Impression/recommendation Admononal pain, ascites for the past few months Has been treated many times like PD cath  associated peritnoins- Catheter was removed in Jan during his last admission- continues to have pain.  Paracentesis could not be done because IR did not see any pocket of easily accessible fluid. We need to rule out because of persistent pain tuberculosis and lymphoma discussed with the care team including the nephrologist and surgeon and the hospitalist.  Would benefit from a diagnostic laparoscopy and peritoneal biopsy should be sent for TB, fungus and histopathology. Patient  continues to be on vancomycin and ceftriaxone.  No improvement in pain.  Would recommend stopping  antibiotics  SARS cov 2 incidental positive test- asymptomatic- day 6- can go for paracentesis  ESRD on HEmodialysis  Discussed the management with the patient and care team.

## 2021-04-13 NOTE — Care Management Important Message (Signed)
Important Message  Patient Details  Name: Anthony Hess MRN: [id number] Date of Birth: [birth year]   Medicare Important Message Given:  Yes     Dannette Barbara 04/13/2021, 10:29 AM

## 2021-04-13 NOTE — Progress Notes (Signed)
[facility]  ROUNDING NOTE   Subjective:   Anthony Hess is a 66 year old male with past medical conditions including bladder cancer, CAD, hypertension, and end-stage renal disease on hemodialysis.  Patient presents to the emergency department with complaints of abdominal pain.  Patient has been admitted for Hypocalcemia [E83.51] Hypokalemia [E87.6] SBP (spontaneous bacterial peritonitis) ([place]) [K65.2] Peritonitis associated with peritoneal dialysis ([place]) [T85.71XA] Abdominal pain, unspecified abdominal location [R10.9]  Patient is known to our practice and receives outpatient dialysis treatments at [facility] on a TTS schedule, supervised by Dr. Holley Raring.    Patient resting well Continues to complain of abdominal pain Awaiting biopsy procedure  Dialysis planned for tomorrow  Objective:  Vital signs in last 24 hours:  Temp:  [98 F (36.7 C)-98.3 F (36.8 C)] 98 F (36.7 C) (02/20 0828) Pulse Rate:  [62-75] 62 (02/20 0828) Resp:  [18-20] 20 (02/20 0526) BP: (115-151)/(82-97) 151/97 (02/20 0828) SpO2:  [95 %-100 %] 96 % (02/20 0828)  Weight change:  Filed Weights   04/09/21 1646 04/11/21 1553 04/11/21 1905  Weight: 53.9 kg 55.2 kg 55.3 kg    Intake/Output: I/O last 3 completed shifts: In: 240 [P.O.:240] Out: -    Intake/Output this shift:  Total I/O In: 360 [P.O.:360] Out: -   Physical Exam: General: NAD, resting in bed  Head: Normocephalic, atraumatic. Moist oral mucosal membranes  Eyes: Anicteric  Lungs:  Clear to auscultation, normal effort  Heart: Regular rate and rhythm  Abdomen:  Soft, tender, mild distention  Extremities: No peripheral edema.  Neurologic: Nonfocal, moving all four extremities  Skin: No lesions  Access: Right IJ PermCath    Basic Metabolic Panel: Recent Labs  Lab 04/07/21 2258 04/09/21 0352 04/10/21 0434 04/11/21 0821  NA 139 139 140 142  K 2.9* 4.4 4.5 4.4  CL 104 104 103 105  CO2 27 22 28 22   GLUCOSE 99 72  100* 84  BUN 23 41* 33* 54*  CREATININE 4.41* 6.30* 5.36* 7.58*  CALCIUM 5.9* 5.7* 6.9* 6.3*  MG  --  2.0  --   --   PHOS  --   --  6.4* 9.1*     Liver Function Tests: Recent Labs  Lab 04/07/21 2258 04/09/21 0352 04/10/21 0434 04/11/21 0821  AST 50* 31  --   --   ALT 70* 47*  --   --   ALKPHOS 57 49  --   --   BILITOT 0.4 0.5  --   --   PROT 5.4* 4.8*  --   --   ALBUMIN 2.3* 2.0* 2.1* 2.1*    Recent Labs  Lab 04/07/21 2258  LIPASE 47    No results for input(s): AMMONIA in the last 168 hours.  CBC: Recent Labs  Lab 04/07/21 2258 04/09/21 0352  WBC 5.1 6.3  HGB 8.7* 8.7*  HCT 29.8* 29.7*  MCV 90.0 90.5  PLT 177 183     Cardiac Enzymes: No results for input(s): CKTOTAL, CKMB, CKMBINDEX, TROPONINI in the last 168 hours.  BNP: Invalid input(s): POCBNP  CBG: No results for input(s): GLUCAP in the last 168 hours.  Microbiology: Results for orders placed or performed during the hospital encounter of 04/08/21  Culture, blood (routine x 2)     Status: None   Collection Time: 04/08/21  5:45 AM   Specimen: BLOOD  Result Value Ref Range Status   Specimen Description BLOOD LEFT HAND  Final   Special Requests   Final    BOTTLES DRAWN  AEROBIC AND ANAEROBIC Blood Culture results may not be optimal due to an excessive volume of blood received in culture bottles   Culture   Final    NO GROWTH 5 DAYS Performed at [facility], [address] [place], [place] [address]    Report Status 04/13/2021 FINAL  Final  Resp Panel by RT-PCR (Flu A&B, Covid) Nasopharyngeal Swab     Status: Abnormal   Collection Time: 04/08/21  5:45 AM   Specimen: Nasopharyngeal Swab; Nasopharyngeal(NP) swabs in vial transport medium  Result Value Ref Range Status   SARS Coronavirus 2 by RT PCR POSITIVE (A) NEGATIVE Final    Comment: (NOTE) SARS-CoV-2 target nucleic acids are DETECTED.  The SARS-CoV-2 RNA is generally detectable in upper respiratory specimens during the acute  phase of infection. Positive results are indicative of the presence of the identified virus, but do not rule out bacterial infection or co-infection with other pathogens not detected by the test. Clinical correlation with patient history and other diagnostic information is necessary to determine patient infection status. The expected result is Negative.  Fact Sheet for Patients: EntrepreneurPulse.com.au  Fact Sheet for Healthcare Providers: IncredibleEmployment.be  This test is not yet approved or cleared by the [place] FDA and  has been authorized for detection and/or diagnosis of SARS-CoV-2 by FDA under an Emergency Use Authorization (EUA).  This EUA will remain in effect (meaning this test can be used) for the duration of  the COVID-19 declaration under Section 564(b)(1) of the A ct, 21 U.S.C. section 360bbb-3(b)(1), unless the authorization is terminated or revoked sooner.     Influenza A by PCR NEGATIVE NEGATIVE Final   Influenza B by PCR NEGATIVE NEGATIVE Final    Comment: (NOTE) The Xpert Xpress SARS-CoV-2/FLU/RSV plus assay is intended as an aid in the diagnosis of influenza from Nasopharyngeal swab specimens and should not be used as a sole basis for treatment. Nasal washings and aspirates are unacceptable for Xpert Xpress SARS-CoV-2/FLU/RSV testing.  Fact Sheet for Patients: EntrepreneurPulse.com.au  Fact Sheet for Healthcare Providers: IncredibleEmployment.be  This test is not yet approved or cleared by the [place] FDA and has been authorized for detection and/or diagnosis of SARS-CoV-2 by FDA under an Emergency Use Authorization (EUA). This EUA will remain in effect (meaning this test can be used) for the duration of the COVID-19 declaration under Section 564(b)(1) of the Act, 21 U.S.C. section 360bbb-3(b)(1), unless the authorization is terminated or revoked.  Performed at  [facility], [address] [place], [place] [address]   Culture, blood (routine x 2)     Status: None   Collection Time: 04/08/21  5:46 AM   Specimen: BLOOD  Result Value Ref Range Status   Specimen Description BLOOD RIGHT HAND  Final   Special Requests   Final    BOTTLES DRAWN AEROBIC AND ANAEROBIC Blood Culture adequate volume   Culture   Final    NO GROWTH 5 DAYS Performed at [facility], [address] [place],  [address]    Report Status 04/13/2021 FINAL  Final  MRSA Next Gen by PCR, Nasal     Status: None   Collection Time: 04/08/21  3:16 PM   Specimen: Nasal Mucosa; Nasal Swab  Result Value Ref Range Status   MRSA by PCR Next Gen NOT DETECTED NOT DETECTED Final    Comment: (NOTE) The GeneXpert MRSA Assay (FDA approved for NASAL specimens only), is one component of a comprehensive MRSA colonization surveillance program. It is  not intended to diagnose MRSA infection nor to guide or monitor treatment for MRSA infections. Test performance is not FDA approved in patients less than 46 years old. Performed at [facility], [address] [place], [place] [address]     Coagulation Studies: No results for input(s): LABPROT, INR in the last 72 hours.  Urinalysis: Recent Labs    04/11/21 0316  COLORURINE YELLOW*  LABSPEC 1.016  PHURINE 6.0  GLUCOSEU NEGATIVE  HGBUR NEGATIVE  BILIRUBINUR NEGATIVE  KETONESUR NEGATIVE  PROTEINUR 100*  NITRITE NEGATIVE  LEUKOCYTESUR NEGATIVE       Imaging: No results found.   Medications:    sodium chloride     cefTRIAXone (ROCEPHIN)  IV 2 g (04/13/21 1258)   vancomycin 500 mg (04/11/21 1747)    vitamin C  500 mg Oral Daily   aspirin EC  81 mg Oral Daily   atorvastatin  40 mg Oral Daily   calcitRIOL  0.5 mcg Oral Daily   calcium carbonate  1,250 mg Oral BID WC   Chlorhexidine Gluconate Cloth  6 each Topical Daily   feeding supplement (NEPRO CARB STEADY)  237 mL Oral TID BM    heparin injection (subcutaneous)  5,000 Units Subcutaneous Q8H   hydrALAZINE  10 mg Oral Q8H   magnesium oxide  400 mg Oral Daily   metoprolol succinate  50 mg Oral Daily   sacubitril-valsartan  1 tablet Oral BID   sucroferric oxyhydroxide  1,000 mg Oral TID WC   zinc sulfate  220 mg Oral Daily   sodium chloride, acetaminophen, albuterol, guaiFENesin-dextromethorphan, ondansetron **OR** ondansetron (ZOFRAN) IV, oxyCODONE  Assessment/ Plan:  [name] is a 66 y.o.  male with past medical conditions including bladder cancer, CAD, hypertension, and end-stage renal disease on hemodialysis.  Patient presents to the emergency department with complaints of abdominal pain.  Patient has been admitted for Hypocalcemia [E83.51] Hypokalemia [E87.6] SBP (spontaneous bacterial peritonitis) ([place]) [K65.2] Peritonitis associated with peritoneal dialysis ([place]) [T85.71XA] Abdominal pain, unspecified abdominal location [R10.9]  [facility]/TTS/ Rt Permcath  End-stage renal disease on hemodialysis.  Will maintain outpatient schedule if possible.  Plan for dialysis tomorrow   2. Anemia of chronic kidney disease Normocytic  Lab Results  Component Value Date   HGB 8.7 (L) 04/09/2021    Hemoglobin below desired target, Micera ordered outpatient will prescribe EPO with next dialysis treatment  3. Secondary Hyperparathyroidism:  Lab Results  Component Value Date   PTH 159 (H) 12/27/2019   CALCIUM 6.3 (LL) 04/11/2021   CAION 0.74 (LL) 01/20/2021   PHOS 9.1 (H) 04/11/2021  We will obtain updated lab work in a.m.  Calcium remains low, primary team may consider IV supplementation.  Will place on calcium 3 diet during dialysis tomorrow.  Phosphorus also remains elevated.  We will consider binders.  Continue calcitriol and calcium carbonate at this time  4.  Hypertension with chronic kidney disease.  Outpatient regimen includes hydralazine, irbesartan, and metoprolol. Irbesartan held at this time.  Now on Entresto.  BP remains elevated 151/97  5.  Peritonitis associated with peritoneal dialysis. Continue vancomycin and Rocephin. ID recommended paracentesis which will be completed once off COVID-19 isolation.  Paracentesis will be used to help rule out TB versus carcinomatosis peritonitis . 6.  COVID-19 positive Positive finding on 04/08/2021.  S/P remdesivir and continue  supportive care therapies.  Isolation discontinued on 04/13/2021.   LOS: 5 Tyyonna Soucy 2/20/20232:27 PM

## 2021-04-13 NOTE — Progress Notes (Signed)
Progress NoteZack Hess    WFU:[id number] DOB: Nov 19, 1955 DOA: 04/08/2021  PCP: Pcp, No    Brief Narrative:   Anthony Hess is a 66 y.o. male with a PMH significant for ESRD on PD previously and now HD TThS. They presented from home to the ED on 04/08/2021 with diffuse abdominal pain x 7 days. He had his non-functioning PD catheter removed about a week ago.  On another note, he has been receiving HD through a tunneled cath. In the ED, it was found that they had mild ascites, peritonitis on CT scan. Signs of enteritis also present.  They were treated with IV Abx and supportive care. Nephrology was consulted for routine HD.  Additionally, patient was found to be COVID-19 positive.  Patient was admitted to medicine service for further workup and management of peritonitis as outlined in detail below.     Subjective:   No acute events reported overnight.  Patient seen and examined this morning.  No fevers or chills.  No nausea or vomiting.  Still having abdominal pain.  Tolerating diet. Plan dialysis tomorrow.  Isolation precautions discontinued.    Assessment and Plan:   PD related peritonitis: Infectious disease following in consultation.  Continue vancomycin and Rocephin.  No safe window for percutaneous access with IR.  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  N.p.o. after midnight.  Incidental COVID-19: Status post remdesivir.  Now off isolation precautions.  On room air.  Hyperphosphatemia and ESRD on HD TTS: VI right 8J PermCath.  Nephrology following.  Continue Velphoro and calcium carbonate.  Dialysis tomorrow.  HTN: Continue home hydralazine, metoprolol, Entresto    Other information:    DVT prophylaxis: Heparin subcu Code Status: Full code Family Communication: None Disposition:   Status is: Inpatient Remains inpatient appropriate because: Need for laparoscopic peritoneal biopsy    Consultants:    General surgery, interventional radiology, nephrology, ID    Objective:    Vitals:   04/12/21 2016 04/13/21 0526 04/13/21 0828 04/13/21 1615  BP: 115/82 124/88 (!) 151/97 110/70  Pulse: 75 70 62 62  Resp: 18 20  16   Temp: 98.1 F (36.7 C) 98.1 F (36.7 C) 98 F (36.7 C) 98.1 F (36.7 C)  TempSrc:  Oral Oral Oral  SpO2: 98% 100% 96% 95%  Weight:      Height:        Intake/Output Summary (Last 24 hours) at 04/13/2021 1745 Last data filed at 04/13/2021 1417 Gross per 24 hour  Intake 360 ml  Output --  Net 360 ml   Filed Weights   04/09/21 1646 04/11/21 1553 04/11/21 1905  Weight: 53.9 kg 55.2 kg 55.3 kg       Physical Exam:    General exam: Appears calm and comfortable, right IJ PermCath in place Respiratory system: Clear to auscultation. Respiratory effort normal. Cardiovascular system: S1 & S2 heard, RRR. No JVD, murmurs, rubs, gallops or clicks. No pedal edema. Gastrointestinal system: Abdomen is mildly distended, soft and mild generalized tenderness. No organomegaly or masses felt. Normal bowel sounds heard. Central nervous system: Alert and oriented. No focal neurological deficits. Extremities: Symmetric 5 x 5 power. Skin: No rashes, lesions or ulcers Psychiatry: Judgement and insight appear normal. Mood & affect appropriate.     Data Reviewed:    I have personally reviewed following labs and imaging studies  CBC: Recent Labs  Lab 04/07/21 2258 04/09/21 0352  WBC 5.1 6.3  HGB 8.7* 8.7*  HCT 29.8* 29.7*  MCV 90.0 90.5  PLT 177 751    Basic Metabolic Panel: Recent Labs  Lab 04/07/21 2258 04/09/21 0352 04/10/21 0434 04/11/21 0821  NA 139 139 140 142  K 2.9* 4.4 4.5 4.4  CL 104 104 103 105  CO2 27 22 28 22   GLUCOSE 99 72 100* 84  BUN 23 41* 33* 54*  CREATININE 4.41* 6.30* 5.36* 7.58*  CALCIUM 5.9* 5.7* 6.9* 6.3*  MG  --  2.0  --   --   PHOS  --   --  6.4* 9.1*    GFR: Estimated Creatinine Clearance: 7.8 mL/min (A) (by C-G  formula based on SCr of 7.58 mg/dL (H)).  Liver Function Tests: Recent Labs  Lab 04/07/21 2258 04/09/21 0352 04/10/21 0434 04/11/21 0821  AST 50* 31  --   --   ALT 70* 47*  --   --   ALKPHOS 57 49  --   --   BILITOT 0.4 0.5  --   --   PROT 5.4* 4.8*  --   --   ALBUMIN 2.3* 2.0* 2.1* 2.1*    CBG: No results for input(s): GLUCAP in the last 168 hours.   Recent Results (from the past 240 hour(s))  Culture, blood (routine x 2)     Status: None   Collection Time: 04/08/21  5:45 AM   Specimen: BLOOD  Result Value Ref Range Status   Specimen Description BLOOD LEFT HAND  Final   Special Requests   Final    BOTTLES DRAWN AEROBIC AND ANAEROBIC Blood Culture results may not be optimal due to an excessive volume of blood received in culture bottles   Culture   Final    NO GROWTH 5 DAYS Performed at [facility], [address] [place], [place] [address]    Report Status 04/13/2021 FINAL  Final  Resp Panel by RT-PCR (Flu A&B, Covid) Nasopharyngeal Swab     Status: Abnormal   Collection Time: 04/08/21  5:45 AM   Specimen: Nasopharyngeal Swab; Nasopharyngeal(NP) swabs in vial transport medium  Result Value Ref Range Status   SARS Coronavirus 2 by RT PCR POSITIVE (A) NEGATIVE Final    Comment: (NOTE) SARS-CoV-2 target nucleic acids are DETECTED.  The SARS-CoV-2 RNA is generally detectable in upper respiratory specimens during the acute phase of infection. Positive results are indicative of the presence of the identified virus, but do not rule out bacterial infection or co-infection with other pathogens not detected by the test. Clinical correlation with patient history and other diagnostic information is necessary to determine patient infection status. The expected result is Negative.  Fact Sheet for Patients: EntrepreneurPulse.com.au  Fact Sheet for Healthcare Providers: IncredibleEmployment.be  This test is not yet approved or  cleared by the [place] FDA and  has been authorized for detection and/or diagnosis of SARS-CoV-2 by FDA under an Emergency Use Authorization (EUA).  This EUA will remain in effect (meaning this test can be used) for the duration of  the COVID-19 declaration under Section 564(b)(1) of the A ct, 21 U.S.C. section 360bbb-3(b)(1), unless the authorization is terminated or revoked sooner.     Influenza A by PCR NEGATIVE NEGATIVE Final   Influenza B by PCR NEGATIVE NEGATIVE Final    Comment: (NOTE) The Xpert Xpress SARS-CoV-2/FLU/RSV plus assay is intended as an aid in the diagnosis of influenza from Nasopharyngeal swab specimens and should not be used as a sole basis for treatment. Nasal washings and aspirates are unacceptable  for Xpert Xpress SARS-CoV-2/FLU/RSV testing.  Fact Sheet for Patients: EntrepreneurPulse.com.au  Fact Sheet for Healthcare Providers: IncredibleEmployment.be  This test is not yet approved or cleared by the [place] FDA and has been authorized for detection and/or diagnosis of SARS-CoV-2 by FDA under an Emergency Use Authorization (EUA). This EUA will remain in effect (meaning this test can be used) for the duration of the COVID-19 declaration under Section 564(b)(1) of the Act, 21 U.S.C. section 360bbb-3(b)(1), unless the authorization is terminated or revoked.  Performed at [facility], [address] [place], [place] [address]   Culture, blood (routine x 2)     Status: None   Collection Time: 04/08/21  5:46 AM   Specimen: BLOOD  Result Value Ref Range Status   Specimen Description BLOOD RIGHT HAND  Final   Special Requests   Final    BOTTLES DRAWN AEROBIC AND ANAEROBIC Blood Culture adequate volume   Culture   Final    NO GROWTH 5 DAYS Performed at [facility], [address], [place], [place] [address]    Report Status 04/13/2021 FINAL  Final  MRSA Next Gen by PCR, Nasal      Status: None   Collection Time: 04/08/21  3:16 PM   Specimen: Nasal Mucosa; Nasal Swab  Result Value Ref Range Status   MRSA by PCR Next Gen NOT DETECTED NOT DETECTED Final    Comment: (NOTE) The GeneXpert MRSA Assay (FDA approved for NASAL specimens only), is one component of a comprehensive MRSA colonization surveillance program. It is not intended to diagnose MRSA infection nor to guide or monitor treatment for MRSA infections. Test performance is not FDA approved in patients less than 41 years old. Performed at [facility], [address] [place], [place] [address]          Radiology Studies:    [place] ASCITES (ABDOMEN LIMITED)  Result Date: 04/13/2021 CLINICAL DATA:  Assess for ascites EXAM: LIMITED ABDOMEN ULTRASOUND FOR ASCITES TECHNIQUE: Limited ultrasound survey for ascites was performed in all four abdominal quadrants. COMPARISON:  CT 04/08/2021 FINDINGS: Only trace amount of ascites is visible within the left lower quadrant and mid abdominal region. No sizable fluid pocket identified for paracentesis IMPRESSION: Trace too small amount of ascites is present. Electronically Signed   By: Donavan Foil M.D.   On: 04/13/2021 15:48        Medications:    Scheduled Meds:  vitamin C  500 mg Oral Daily   aspirin EC  81 mg Oral Daily   atorvastatin  40 mg Oral Daily   calcitRIOL  0.5 mcg Oral Daily   calcium carbonate  1,250 mg Oral BID WC   Chlorhexidine Gluconate Cloth  6 each Topical Daily   [START ON 04/14/2021] epoetin (EPOGEN/PROCRIT) injection  10,000 Units Subcutaneous Q T,Th,Sa-HD   feeding supplement (NEPRO CARB STEADY)  237 mL Oral TID BM   heparin injection (subcutaneous)  5,000 Units Subcutaneous Q8H   hydrALAZINE  10 mg Oral Q8H   magnesium oxide  400 mg Oral Daily   metoprolol succinate  50 mg Oral Daily   sacubitril-valsartan  1 tablet Oral BID   sucroferric oxyhydroxide  1,000 mg Oral TID WC   zinc sulfate  220 mg Oral Daily   Continuous  Infusions:  sodium chloride     cefTRIAXone (ROCEPHIN)  IV 2 g (04/13/21 1258)   vancomycin 500 mg (04/11/21 1747)       LOS: 5 days    Time  spent: 40 minutes    Leslee Home, MD [facility]   To contact the attending provider between 7A-7P or the covering provider during after hours 7P-7A, please log into the web site [email or web address] and access using universal [place] password for that web site. If you do not have the password, please call the hospital operator.  04/13/2021, 5:45 PM

## 2021-04-13 NOTE — Progress Notes (Signed)
Patient seen in [place] for diagnostic paracentesis, imaging obtained of all abdominal quadrants with no safe window for percutaneous access, only trace amount of ascites seen in one quadrant with overlying bowel. Procedure cancelled and findings discussed with patient today.  Hedy Jacob, PA-C 04/13/2021, 2:59 PM

## 2021-04-13 NOTE — Consult Note (Signed)
Pharmacy Antibiotic Note  Anthony Hess is a 66 y.o. male admitted on 04/08/2021 with  peritonitis .  Pharmacy has been consulted for vancomycin dosing.  Plan: Vancomycin 1250mg  IV once followed by 500mg  IV every Tue Thur Sat  with hemodialysis Collect vancomycin random  level on 2/23 prior to 4th dose.   Dr. Delaine Lame (ID specialist) will continue to follow.  Height: 5\' 3"  (160 cm) Weight: 55.3 kg (121 lb 14.6 oz) IBW/kg (Calculated) : 56.9  Temp (24hrs), Avg:98.1 F (36.7 C), Min:98 F (36.7 C), Max:98.3 F (36.8 C)  Recent Labs  Lab 04/07/21 2258 04/08/21 0545 04/09/21 0352 04/10/21 0434 04/11/21 0821  WBC 5.1  --  6.3  --   --   CREATININE 4.41*  --  6.30* 5.36* 7.58*  LATICACIDVEN  --  1.3  --   --   --      Estimated Creatinine Clearance: 7.8 mL/min (A) (by C-G formula based on SCr of 7.58 mg/dL (H)).    No Known Allergies  Antimicrobials this admission: 2/15 Vancomycin >>  2/15 Ceftriaxone >>  Microbiology results: 2/15 BCx: NG x 5 days  Thank you for allowing pharmacy to be a part of this patients care.  Kirk Basquez Rodriguez-Guzman PharmD, BCPS 04/13/2021 12:37 PM

## 2021-04-14 ENCOUNTER — Encounter: Payer: Self-pay | Admitting: Internal Medicine

## 2021-04-14 DIAGNOSIS — R1084 Generalized abdominal pain: Secondary | ICD-10-CM | POA: Diagnosis not present

## 2021-04-14 DIAGNOSIS — T8571XD Infection and inflammatory reaction due to peritoneal dialysis catheter, subsequent encounter: Secondary | ICD-10-CM | POA: Diagnosis not present

## 2021-04-14 LAB — RENAL FUNCTION PANEL
Albumin: 2 g/dL — ABNORMAL LOW (ref 3.5–5.0)
Anion gap: 13 (ref 5–15)
BUN: 64 mg/dL — ABNORMAL HIGH (ref 8–23)
CO2: 23 mmol/L (ref 22–32)
Calcium: 6.8 mg/dL — ABNORMAL LOW (ref 8.9–10.3)
Chloride: 104 mmol/L (ref 98–111)
Creatinine, Ser: 8.45 mg/dL — ABNORMAL HIGH (ref 0.61–1.24)
GFR, Estimated: 7 mL/min — ABNORMAL LOW (ref >60)
Glucose, Bld: 82 mg/dL (ref 70–99)
Phosphorus: 7.5 mg/dL — ABNORMAL HIGH (ref 2.5–4.6)
Potassium: 4.7 mmol/L (ref 3.5–5.1)
Sodium: 140 mmol/L (ref 135–145)

## 2021-04-14 MED ORDER — SODIUM CHLORIDE 0.9 % IV SOLN: 100.0000 mL | INTRAVENOUS | Status: DC | PRN

## 2021-04-14 MED ORDER — HEPARIN SODIUM (PORCINE) 1000 UNIT/ML IJ SOLN
INTRAMUSCULAR | Status: AC
  Administered 2021-04-14: 3800 [IU] via INTRAVENOUS_CENTRAL
  Filled 2021-04-14: qty 10

## 2021-04-14 MED ORDER — EPOETIN ALFA 10000 UNIT/ML IJ SOLN
INTRAMUSCULAR | Status: AC
  Administered 2021-04-14: 10000 [IU]
  Filled 2021-04-14: qty 1

## 2021-04-14 MED ORDER — HEPARIN SODIUM (PORCINE) 1000 UNIT/ML DIALYSIS: 1000.0000 [IU] | INTRAMUSCULAR | Status: DC | PRN

## 2021-04-14 MED ORDER — RENA-VITE PO TABS
1.0000 | ORAL_TABLET | Freq: Every day | ORAL | Status: DC
  Administered 2021-04-14 – 2021-04-16 (×3): 1 via ORAL
  Filled 2021-04-14 (×4): qty 1

## 2021-04-14 MED ORDER — LIDOCAINE-PRILOCAINE 2.5-2.5 % EX CREA
1.0000 "application " | TOPICAL_CREAM | CUTANEOUS | Status: DC | PRN
  Filled 2021-04-14: qty 5

## 2021-04-14 MED ORDER — PENTAFLUOROPROP-TETRAFLUOROETH EX AERO
1.0000 "application " | INHALATION_SPRAY | CUTANEOUS | Status: DC | PRN
  Filled 2021-04-14: qty 30

## 2021-04-14 MED ORDER — ALTEPLASE 2 MG IJ SOLR: 2.0000 mg | Freq: Once | INTRAMUSCULAR | Status: DC | PRN

## 2021-04-14 MED ORDER — LIDOCAINE HCL (PF) 1 % IJ SOLN
5.0000 mL | INTRAMUSCULAR | Status: DC | PRN
  Filled 2021-04-14: qty 5

## 2021-04-14 NOTE — Progress Notes (Signed)
Patient completed three hours of hemodialysis via (R) CVC using a 2K, 3 ca++ bath for a net UF of 652 mL.  Patient's BP soft at start of treatment and UF paused for fifteen minutes with improvement in BP.

## 2021-04-14 NOTE — Assessment & Plan Note (Signed)
Blood pressure within goal. -Continue home dose of hydralazine, metoprolol and Entresto

## 2021-04-14 NOTE — Progress Notes (Signed)
[facility]  ROUNDING NOTE   Subjective:   Anthony Hess is a 66 year old male with past medical conditions including bladder cancer, CAD, hypertension, and end-stage renal disease on hemodialysis.  Patient presents to the emergency department with complaints of abdominal pain.  Patient has been admitted for Hypocalcemia [E83.51] Hypokalemia [E87.6] SBP (spontaneous bacterial peritonitis) ([place]) [K65.2] Peritonitis associated with peritoneal dialysis ([place]) [T85.71XA] Abdominal pain, unspecified abdominal location [R10.9]  Patient is known to our practice and receives outpatient dialysis treatments at [facility] on a TTS schedule, supervised by Dr. Holley Raring.    Patient seen and evaluated during dialysis   HEMODIALYSIS FLOWSHEET:  Blood Flow Rate (mL/min): 400 mL/min Arterial Pressure (mmHg): -170 mmHg Venous Pressure (mmHg): 110 mmHg Transmembrane Pressure (mmHg): 40 mmHg Ultrafiltration Rate (mL/min): 500 mL/min Dialysate Flow Rate (mL/min): 500 ml/min Conductivity: Machine : 13.7 Conductivity: Machine : 13.7 Dialysis Fluid Bolus: Normal Saline Bolus Amount (mL): 250 mL  No complaints at this time Questioning timing of biopsy Request wife be informed of current treatment plan  Objective:  Vital signs in last 24 hours:  Temp:  [97.8 F (36.6 C)-98.8 F (37.1 C)] 98.8 F (37.1 C) (02/21 0830) Pulse Rate:  [44-75] 58 (02/21 1130) Resp:  [10-26] 20 (02/21 1200) BP: (83-141)/(56-104) 96/73 (02/21 1200) SpO2:  [86 %-100 %] 99 % (02/21 1200) Weight:  [55.4 kg] 55.4 kg (02/21 0830)  Weight change:  Filed Weights   04/11/21 1553 04/11/21 1905 04/14/21 0830  Weight: 55.2 kg 55.3 kg 55.4 kg    Intake/Output: I/O last 3 completed shifts: In: 360 [P.O.:360] Out: -    Intake/Output this shift:  No intake/output data recorded.  Physical Exam: General: NAD, resting in bed  Head: Normocephalic, atraumatic. Moist oral mucosal membranes  Eyes: Anicteric   Lungs:  Clear to auscultation, normal effort  Heart: Regular rate and rhythm  Abdomen:  Soft, tender, mild distention  Extremities: No peripheral edema.  Neurologic: Nonfocal, moving all four extremities  Skin: No lesions  Access: Right IJ PermCath    Basic Metabolic Panel: Recent Labs  Lab 04/07/21 2258 04/09/21 0352 04/10/21 0434 04/11/21 0821 04/14/21 0808  NA 139 139 140 142 140  K 2.9* 4.4 4.5 4.4 4.7  CL 104 104 103 105 104  CO2 27 22 28 22 23   GLUCOSE 99 72 100* 84 82  BUN 23 41* 33* 54* 64*  CREATININE 4.41* 6.30* 5.36* 7.58* 8.45*  CALCIUM 5.9* 5.7* 6.9* 6.3* 6.8*  MG  --  2.0  --   --   --   PHOS  --   --  6.4* 9.1* 7.5*     Liver Function Tests: Recent Labs  Lab 04/07/21 2258 04/09/21 0352 04/10/21 0434 04/11/21 0821 04/14/21 0808  AST 50* 31  --   --   --   ALT 70* 47*  --   --   --   ALKPHOS 57 49  --   --   --   BILITOT 0.4 0.5  --   --   --   PROT 5.4* 4.8*  --   --   --   ALBUMIN 2.3* 2.0* 2.1* 2.1* 2.0*    Recent Labs  Lab 04/07/21 2258  LIPASE 47    No results for input(s): AMMONIA in the last 168 hours.  CBC: Recent Labs  Lab 04/07/21 2258 04/09/21 0352  WBC 5.1 6.3  HGB 8.7* 8.7*  HCT 29.8* 29.7*  MCV 90.0 90.5  PLT 177 183  Cardiac Enzymes: No results for input(s): CKTOTAL, CKMB, CKMBINDEX, TROPONINI in the last 168 hours.  BNP: Invalid input(s): POCBNP  CBG: No results for input(s): GLUCAP in the last 168 hours.  Microbiology: Results for orders placed or performed during the hospital encounter of 04/08/21  Culture, blood (routine x 2)     Status: None   Collection Time: 04/08/21  5:45 AM   Specimen: BLOOD  Result Value Ref Range Status   Specimen Description BLOOD LEFT HAND  Final   Special Requests   Final    BOTTLES DRAWN AEROBIC AND ANAEROBIC Blood Culture results may not be optimal due to an excessive volume of blood received in culture bottles   Culture   Final    NO GROWTH 5 DAYS Performed at  [facility], [address] [place], [place] [address]    Report Status 04/13/2021 FINAL  Final  Resp Panel by RT-PCR (Flu A&B, Covid) Nasopharyngeal Swab     Status: Abnormal   Collection Time: 04/08/21  5:45 AM   Specimen: Nasopharyngeal Swab; Nasopharyngeal(NP) swabs in vial transport medium  Result Value Ref Range Status   SARS Coronavirus 2 by RT PCR POSITIVE (A) NEGATIVE Final    Comment: (NOTE) SARS-CoV-2 target nucleic acids are DETECTED.  The SARS-CoV-2 RNA is generally detectable in upper respiratory specimens during the acute phase of infection. Positive results are indicative of the presence of the identified virus, but do not rule out bacterial infection or co-infection with other pathogens not detected by the test. Clinical correlation with patient history and other diagnostic information is necessary to determine patient infection status. The expected result is Negative.  Fact Sheet for Patients: EntrepreneurPulse.com.au  Fact Sheet for Healthcare Providers: IncredibleEmployment.be  This test is not yet approved or cleared by the [place] FDA and  has been authorized for detection and/or diagnosis of SARS-CoV-2 by FDA under an Emergency Use Authorization (EUA).  This EUA will remain in effect (meaning this test can be used) for the duration of  the COVID-19 declaration under Section 564(b)(1) of the A ct, 21 U.S.C. section 360bbb-3(b)(1), unless the authorization is terminated or revoked sooner.     Influenza A by PCR NEGATIVE NEGATIVE Final   Influenza B by PCR NEGATIVE NEGATIVE Final    Comment: (NOTE) The Xpert Xpress SARS-CoV-2/FLU/RSV plus assay is intended as an aid in the diagnosis of influenza from Nasopharyngeal swab specimens and should not be used as a sole basis for treatment. Nasal washings and aspirates are unacceptable for Xpert Xpress SARS-CoV-2/FLU/RSV testing.  Fact Sheet for  Patients: EntrepreneurPulse.com.au  Fact Sheet for Healthcare Providers: IncredibleEmployment.be  This test is not yet approved or cleared by the [place] FDA and has been authorized for detection and/or diagnosis of SARS-CoV-2 by FDA under an Emergency Use Authorization (EUA). This EUA will remain in effect (meaning this test can be used) for the duration of the COVID-19 declaration under Section 564(b)(1) of the Act, 21 U.S.C. section 360bbb-3(b)(1), unless the authorization is terminated or revoked.  Performed at [facility], [address] [place], [place] [address]   Culture, blood (routine x 2)     Status: None   Collection Time: 04/08/21  5:46 AM   Specimen: BLOOD  Result Value Ref Range Status   Specimen Description BLOOD RIGHT HAND  Final   Special Requests   Final    BOTTLES DRAWN AEROBIC AND ANAEROBIC Blood Culture adequate volume   Culture   Final    NO  GROWTH 5 DAYS Performed at [facility], [address] [place], [place] [address]    Report Status 04/13/2021 FINAL  Final  MRSA Next Gen by PCR, Nasal     Status: None   Collection Time: 04/08/21  3:16 PM   Specimen: Nasal Mucosa; Nasal Swab  Result Value Ref Range Status   MRSA by PCR Next Gen NOT DETECTED NOT DETECTED Final    Comment: (NOTE) The GeneXpert MRSA Assay (FDA approved for NASAL specimens only), is one component of a comprehensive MRSA colonization surveillance program. It is not intended to diagnose MRSA infection nor to guide or monitor treatment for MRSA infections. Test performance is not FDA approved in patients less than 48 years old. Performed at [facility], [address] [place], [place] [address]     Coagulation Studies: No results for input(s): LABPROT, INR in the last 72 hours.  Urinalysis: No results for input(s): COLORURINE, LABSPEC, PHURINE, GLUCOSEU, HGBUR, BILIRUBINUR, KETONESUR, PROTEINUR,  UROBILINOGEN, NITRITE, LEUKOCYTESUR in the last 72 hours.  Invalid input(s): APPERANCEUR     Imaging: [place] ASCITES (ABDOMEN LIMITED)  Result Date: 04/13/2021 CLINICAL DATA:  Assess for ascites EXAM: LIMITED ABDOMEN ULTRASOUND FOR ASCITES TECHNIQUE: Limited ultrasound survey for ascites was performed in all four abdominal quadrants. COMPARISON:  CT 04/08/2021 FINDINGS: Only trace amount of ascites is visible within the left lower quadrant and mid abdominal region. No sizable fluid pocket identified for paracentesis IMPRESSION: Trace too small amount of ascites is present. Electronically Signed   By: Donavan Foil M.D.   On: 04/13/2021 15:48     Medications:    sodium chloride     sodium chloride     sodium chloride      vitamin C  500 mg Oral Daily   aspirin EC  81 mg Oral Daily   atorvastatin  40 mg Oral Daily   calcitRIOL  0.5 mcg Oral Daily   calcium carbonate  1,250 mg Oral BID WC   Chlorhexidine Gluconate Cloth  6 each Topical Daily   epoetin (EPOGEN/PROCRIT) injection  10,000 Units Subcutaneous Q T,Th,Sa-HD   feeding supplement (NEPRO CARB STEADY)  237 mL Oral TID BM   heparin injection (subcutaneous)  5,000 Units Subcutaneous Q8H   heparin sodium (porcine)       hydrALAZINE  10 mg Oral Q8H   magnesium oxide  400 mg Oral Daily   metoprolol succinate  50 mg Oral Daily   sacubitril-valsartan  1 tablet Oral BID   sucroferric oxyhydroxide  1,000 mg Oral TID WC   zinc sulfate  220 mg Oral Daily   sodium chloride, sodium chloride, sodium chloride, acetaminophen, albuterol, alteplase, guaiFENesin-dextromethorphan, heparin, lidocaine (PF), lidocaine-prilocaine, ondansetron **OR** ondansetron (ZOFRAN) IV, oxyCODONE, pentafluoroprop-tetrafluoroeth  Assessment/ Plan:  [name] is a 66 y.o.  male with past medical conditions including bladder cancer, CAD, hypertension, and end-stage renal disease on hemodialysis.  Patient presents to the emergency department with complaints of  abdominal pain.  Patient has been admitted for Hypocalcemia [E83.51] Hypokalemia [E87.6] SBP (spontaneous bacterial peritonitis) ([place]) [K65.2] Peritonitis associated with peritoneal dialysis ([place]) [T85.71XA] Abdominal pain, unspecified abdominal location [R10.9]  [facility]/TTS/ Rt Permcath  End-stage renal disease on hemodialysis.  Will maintain outpatient schedule if possible.  Currently receiving hemodialysis, UF goal 1 L as tolerated.  Next treatment scheduled for Thursday.  2. Anemia of chronic kidney disease Normocytic  Lab Results  Component Value Date   HGB 8.7 (L) 04/09/2021    Hemoglobin remains below  target. Mircera prescribed outpatient We will start patient on EPO 10,000 units IV with dialysis treatments  3. Secondary Hyperparathyroidism:  Lab Results  Component Value Date   PTH 159 (H) 12/27/2019   CALCIUM 6.8 (L) 04/14/2021   CAION 0.74 (LL) 01/20/2021   PHOS 7.5 (H) 04/14/2021  Calcium slightly improved.  We will continue calcium 3 bath on dialysis.  Continue calcium carbonate and calcitriol  4.  Hypertension with chronic kidney disease.  Outpatient regimen includes hydralazine, irbesartan, and metoprolol. Irbesartan held at this time. Now on Entresto.     BP currently 96/73 during dialysis  5.  Peritonitis associated with peritoneal dialysis. Continue vancomycin and Rocephin. Paracentesis attempted yesterday but was unable to safely localize a pocket of fluid to obtain.  We will continue with laparoscopic peritoneal biopsy later today. . 6.  COVID-19 positive Positive finding on 04/08/2021.  S/P remdesivir and continue  supportive care therapies.  Isolation discontinued on 04/13/2021.   LOS: 6 [place] 2/21/202312:07 PM

## 2021-04-14 NOTE — Hospital Course (Addendum)
Taken from prior notes.  Anthony Hess is a 66 y.o. male with a PMH significant for ESRD on PD previously and now HD TThS. They presented from home to the ED on 04/08/2021 with diffuse abdominal pain x 7 days. He had his non-functioning PD catheter removed about a week ago.  On another note, he has been receiving HD through a tunneled cath. In the ED, it was found that they had mild ascites, peritonitis on CT scan. Signs of enteritis also present.  They were treated with IV Abx and supportive care.  ID was also consulted, they are recommending stopping antibiotics and doing a peritoneal biopsy as there is no sufficient ascites for paracentesis. Surgery was consulted and plan is to do a peritoneal biopsy to rule out lymphoma, TB or any other atypical infection due to persistent pain on Thursday morning.  Nephrology was consulted for routine HD, patient is having regular dialysis on TTS.  He was also tested positive for COVID-19, remained asymptomatic.  Per ID note patient needs total of 10 days of isolation, today day 8.  Patient underwent diagnostic laparoscopic evaluation and peritoneal biopsies today with general surgery.  Tolerated the procedure well.  Developed mild hypotension after the procedure, responded well to IV fluid.

## 2021-04-14 NOTE — Assessment & Plan Note (Signed)
Hemoglobin seems stable. -Continue to monitor -Transfuse if below 7

## 2021-04-14 NOTE — Progress Notes (Signed)
Care nurse walked in to give patient heads up that he will be nothing to eat or drink after 15 minutes. Pt had multiple empty containers of drink on his tray table. Care nurse proceeded to clear the table and found his HD catheter dressing balled up on the tray table. At earlier assessment time dressing was c/d/I approx. at 2115. Care nurse briskly checked the catheter which was uncovered with any dressing under his T-shirt and gown. Asked patient to stay still, grabbed central line dressing change kit with biopatch. Cleaned the area with CHG swabs prior to performing complete sterile dressing change in witness with another nurse Burlene Arnt RN. Re-educated patient on importance of keeping dressing intact to prevent central line infection and severity of those infection with grave consequences. Pt reported he didn't realize when he pulled it and later stated "it was loose so I put it there". Once again reeducated patient on importance of notifying RN prior to touching that area and avoid self intervention.

## 2021-04-14 NOTE — Assessment & Plan Note (Signed)
Patient had her dialysis today. Nephrology is following. -Continue routine dialysis -Continue with Velphoro and calcium carbonate supplement

## 2021-04-14 NOTE — Progress Notes (Signed)
Progress Note   Patient: Anthony Hess XBD:[id number] DOB: 1955-11-18 DOA: 04/08/2021     6 DOS: the patient was seen and examined on 04/14/2021   Brief hospital course: Taken from prior notes.  Anthony Hess is a 66 y.o. male with a PMH significant for ESRD on PD previously and now HD TThS. They presented from home to the ED on 04/08/2021 with diffuse abdominal pain x 7 days. He had his non-functioning PD catheter removed about a week ago.  On another note, he has been receiving HD through a tunneled cath. In the ED, it was found that they had mild ascites, peritonitis on CT scan. Signs of enteritis also present.  They were treated with IV Abx and supportive care.  ID was also consulted, they are recommending stopping antibiotics and doing a peritoneal biopsy as there is no sufficient ascites for paracentesis. Surgery was consulted and plan is to do a peritoneal biopsy to rule out lymphoma, TB or any other atypical infection due to persistent pain on Thursday morning.  Nephrology was consulted for routine HD, patient is having regular dialysis on TTS.  He was also tested positive for COVID-19, remained asymptomatic and complete isolation.   Assessment and Plan: * Peritonitis associated with peritoneal dialysis ([place])- (present on admission) Patient continued to have abdominal pain, seems chronic.  Infectious diseases following and they discontinue antibiotics. -Plan is to have a peritoneal biopsy done by general surgery on Thursday morning, need to rule out lymphoma/TB/any other atypical infection.-Appreciate ID help. -Continue with pain management.  ESRD (end stage renal disease) ([place])- (present on admission) Patient had her dialysis today. Nephrology is following. -Continue routine dialysis -Continue with Velphoro and calcium carbonate supplement  Anemia in ESRD (end-stage renal disease) ([place])- (present on admission) Hemoglobin seems stable. -Continue to monitor -Transfuse if below  7  COVID-19 virus infection Patient received remdesivir.  Now off the isolation. Remained on room air. -Supportive care  Hypertension- (present on admission) Blood pressure within goal. -Continue home dose of hydralazine, metoprolol and Entresto  Estimated body mass index is 21.21 kg/m as calculated from the following:   Height as of this encounter: 5\' 3"  (1.6 m).   Weight as of this encounter: 54.3 kg.   Subjective: Patient was seen and examined after getting dialysis today.  Continue to have diffuse abdominal pain.  No nausea or vomiting.  He was asking about his biopsy.  Physical Exam: Vitals:   04/14/21 1214 04/14/21 1215 04/14/21 1230 04/14/21 1343  BP: 100/77 100/77 100/77 115/87  Pulse:    70  Resp: 17 (!) 22 18 16   Temp:    98.4 F (36.9 C)  TempSrc:    Oral  SpO2:    96%  Weight:   54.3 kg   Height:       General.  Malnourished gentleman, in no acute distress. Pulmonary.  Lungs clear bilaterally, normal respiratory effort. CV.  Regular rate and rhythm, no JVD, rub or murmur. Abdomen.  Soft, nontender, nondistended, BS positive. CNS.  Alert and oriented .  No focal neurologic deficit. Extremities.  No edema, no cyanosis, pulses intact and symmetrical. Psychiatry.  Judgment and insight appears normal.  Data Reviewed: I reviewed prior notes and labs.  Family Communication: Discussed with patient  Disposition: Status is: Inpatient Remains inpatient appropriate because: Severity of illness.  Planned Discharge Destination: Home with Home Health, pending peritoneal biopsy  DVT prophylaxis.  Subcu heparin Time spent: 50 minutes  Author: Lorella Nimrod, MD 04/14/2021 2:54 PM  This record has been created using Systems analyst. Errors have been sought and corrected,but may not always be located. Such creation errors do not reflect on the standard of care.  For on call review [email or web address].

## 2021-04-14 NOTE — Assessment & Plan Note (Signed)
Patient continued to have abdominal pain, seems chronic.  Infectious diseases following and they discontinue antibiotics. -Plan is to have a peritoneal biopsy done by general surgery on Thursday morning, need to rule out lymphoma/TB/any other atypical infection.-Appreciate ID help. -Continue with pain management.

## 2021-04-14 NOTE — Assessment & Plan Note (Signed)
Patient received remdesivir.  Now off the isolation. Remained on room air. -Supportive care

## 2021-04-15 DIAGNOSIS — Z992 Dependence on renal dialysis: Secondary | ICD-10-CM

## 2021-04-15 DIAGNOSIS — G8929 Other chronic pain: Secondary | ICD-10-CM

## 2021-04-15 DIAGNOSIS — R1084 Generalized abdominal pain: Secondary | ICD-10-CM | POA: Diagnosis not present

## 2021-04-15 DIAGNOSIS — T8571XD Infection and inflammatory reaction due to peritoneal dialysis catheter, subsequent encounter: Secondary | ICD-10-CM | POA: Diagnosis not present

## 2021-04-15 LAB — CBC
HCT: 29.3 % — ABNORMAL LOW (ref 39.0–52.0)
Hemoglobin: 8.5 g/dL — ABNORMAL LOW (ref 13.0–17.0)
MCH: 25.8 pg — ABNORMAL LOW (ref 26.0–34.0)
MCHC: 29 g/dL — ABNORMAL LOW (ref 30.0–36.0)
MCV: 89.1 fL (ref 80.0–100.0)
Platelets: 219 10*3/uL (ref 150–400)
RBC: 3.29 MIL/uL — ABNORMAL LOW (ref 4.22–5.81)
RDW: 17.4 % — ABNORMAL HIGH (ref 11.5–15.5)
WBC: 6 10*3/uL (ref 4.0–10.5)
nRBC: 0 % (ref 0.0–0.2)

## 2021-04-15 LAB — CREATININE, SERUM
Creatinine, Ser: 5.71 mg/dL — ABNORMAL HIGH (ref 0.61–1.24)
GFR, Estimated: 10 mL/min — ABNORMAL LOW (ref >60)

## 2021-04-15 NOTE — Progress Notes (Signed)
ID Pt c/o abdominal pain No fever or other associated symptoms No nausea or vomiting  O/e comfortable at rest Patient Vitals for the past 24 hrs:  BP Temp Temp src Pulse Resp SpO2  04/15/21 1524 107/88 98.7 F (37.1 C) Oral 69 18 98 %  04/15/21 0757 (!) 124/98 (!) 97.4 F (36.3 C) Oral 69 18 100 %  04/15/21 0520 (!) 131/97 97.7 F (36.5 C) Oral 65 20 95 %  04/14/21 2109 118/85 98.6 F (37 C) Oral 82 16 95 %  04/14/21 1656 101/61 (!) 97.4 F (36.3 C) -- 86 18 93 %   RT IJ HD cath Chest b/l air entry [id number] Abd soft Some distension Tender to palpation below umbilicus No erythema or discharge from previous PD cath site  Labs CBC Latest Ref Rng & Units 04/15/2021 04/09/2021 04/07/2021  WBC 4.0 - 10.5 K/uL 6.0 6.3 5.1  Hemoglobin 13.0 - 17.0 g/dL 8.5(L) 8.7(L) 8.7(L)  Hematocrit 39.0 - 52.0 % 29.3(L) 29.7(L) 29.8(L)  Platelets 150 - 400 K/uL 219 183 177    CMP Latest Ref Rng & Units 04/15/2021 04/14/2021 04/11/2021  Glucose 70 - 99 mg/dL - 82 84  BUN 8 - 23 mg/dL - 64(H) 54(H)  Creatinine 0.61 - 1.24 mg/dL 5.71(H) 8.45(H) 7.58(H)  Sodium 135 - 145 mmol/L - 140 142  Potassium 3.5 - 5.1 mmol/L - 4.7 4.4  Chloride 98 - 111 mmol/L - 104 105  CO2 22 - 32 mmol/L - 23 22  Calcium 8.9 - 10.3 mg/dL - 6.8(L) 6.3(LL)  Total Protein 6.5 - 8.1 g/dL - - -  Total Bilirubin 0.3 - 1.2 mg/dL - - -  Alkaline Phos 38 - 126 U/L - - -  AST 15 - 41 U/L - - -  ALT 0 - 44 U/L - - -     Micro 04/08/21 BC NG  Impression /recommendation Chronic abdominal pain for past 3 months- initially thought to be due to PD cath induced peritonitis- was treated with prolonged courses of antibiotics- cath was removed in Jan. Pt is waiting to have laparoscopy for diagnosis and biopsies from peritoneum will be taken for AFB, fungal, bacterial and histopathological examination. 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

## 2021-04-15 NOTE — Progress Notes (Signed)
[facility]  ROUNDING NOTE   Subjective:   Anthony Hess is a 66 year old male with past medical conditions including bladder cancer, CAD, hypertension, and end-stage renal disease on hemodialysis.  Patient presents to the emergency department with complaints of abdominal pain.  Patient has been admitted for Hypocalcemia [E83.51] Hypokalemia [E87.6] SBP (spontaneous bacterial peritonitis) ([place]) [K65.2] Peritonitis associated with peritoneal dialysis ([place]) [T85.71XA] Abdominal pain, unspecified abdominal location [R10.9]  Patient is known to our practice and receives outpatient dialysis treatments at [facility] on a TTS schedule, supervised by Dr. Holley Raring.    Patient seen resting quietly Alert and oriented Complains of mild lower abdominal pain Tolerating small meals  Objective:  Vital signs in last 24 hours:  Temp:  [97.4 F (36.3 C)-98.7 F (37.1 C)] 98.7 F (37.1 C) (02/22 1524) Pulse Rate:  [65-86] 69 (02/22 1524) Resp:  [16-20] 18 (02/22 1524) BP: (101-131)/(61-98) 107/88 (02/22 1524) SpO2:  [93 %-100 %] 98 % (02/22 1524)  Weight change:  Filed Weights   04/11/21 1905 04/14/21 0830 04/14/21 1230  Weight: 55.3 kg 55.4 kg 54.3 kg    Intake/Output: I/O last 3 completed shifts: In: 120 [P.O.:120] Out: 652 [Other:652]   Intake/Output this shift:  No intake/output data recorded.  Physical Exam: General: NAD, resting in bed  Head: Normocephalic, atraumatic. Moist oral mucosal membranes  Eyes: Anicteric  Lungs:  Clear to auscultation, normal effort  Heart: Regular rate and rhythm  Abdomen:  Soft, tender, mild distention  Extremities: No peripheral edema.  Neurologic: Nonfocal, moving all four extremities  Skin: No lesions  Access: Right IJ PermCath    Basic Metabolic Panel: Recent Labs  Lab 04/09/21 0352 04/10/21 0434 04/11/21 0821 04/14/21 0808 04/15/21 0329  NA 139 140 142 140  --   K 4.4 4.5 4.4 4.7  --   CL 104 103 105 104  --   CO2  22 28 22 23   --   GLUCOSE 72 100* 84 82  --   BUN 41* 33* 54* 64*  --   CREATININE 6.30* 5.36* 7.58* 8.45* 5.71*  CALCIUM 5.7* 6.9* 6.3* 6.8*  --   MG 2.0  --   --   --   --   PHOS  --  6.4* 9.1* 7.5*  --      Liver Function Tests: Recent Labs  Lab 04/09/21 0352 04/10/21 0434 04/11/21 0821 04/14/21 0808  AST 31  --   --   --   ALT 47*  --   --   --   ALKPHOS 49  --   --   --   BILITOT 0.5  --   --   --   PROT 4.8*  --   --   --   ALBUMIN 2.0* 2.1* 2.1* 2.0*    No results for input(s): LIPASE, AMYLASE in the last 168 hours.  No results for input(s): AMMONIA in the last 168 hours.  CBC: Recent Labs  Lab 04/09/21 0352 04/15/21 0329  WBC 6.3 6.0  HGB 8.7* 8.5*  HCT 29.7* 29.3*  MCV 90.5 89.1  PLT 183 219     Cardiac Enzymes: No results for input(s): CKTOTAL, CKMB, CKMBINDEX, TROPONINI in the last 168 hours.  BNP: Invalid input(s): POCBNP  CBG: No results for input(s): GLUCAP in the last 168 hours.  Microbiology: Results for orders placed or performed during the hospital encounter of 04/08/21  Culture, blood (routine x 2)     Status: None   Collection Time: 04/08/21  5:45 AM   Specimen: BLOOD  Result Value Ref Range Status   Specimen Description BLOOD LEFT HAND  Final   Special Requests   Final    BOTTLES DRAWN AEROBIC AND ANAEROBIC Blood Culture results may not be optimal due to an excessive volume of blood received in culture bottles   Culture   Final    NO GROWTH 5 DAYS Performed at [facility], [address] [place], [place] [address]    Report Status 04/13/2021 FINAL  Final  Resp Panel by RT-PCR (Flu A&B, Covid) Nasopharyngeal Swab     Status: Abnormal   Collection Time: 04/08/21  5:45 AM   Specimen: Nasopharyngeal Swab; Nasopharyngeal(NP) swabs in vial transport medium  Result Value Ref Range Status   SARS Coronavirus 2 by RT PCR POSITIVE (A) NEGATIVE Final    Comment: (NOTE) SARS-CoV-2 target nucleic acids are DETECTED.  The  SARS-CoV-2 RNA is generally detectable in upper respiratory specimens during the acute phase of infection. Positive results are indicative of the presence of the identified virus, but do not rule out bacterial infection or co-infection with other pathogens not detected by the test. Clinical correlation with patient history and other diagnostic information is necessary to determine patient infection status. The expected result is Negative.  Fact Sheet for Patients: EntrepreneurPulse.com.au  Fact Sheet for Healthcare Providers: IncredibleEmployment.be  This test is not yet approved or cleared by the [place] FDA and  has been authorized for detection and/or diagnosis of SARS-CoV-2 by FDA under an Emergency Use Authorization (EUA).  This EUA will remain in effect (meaning this test can be used) for the duration of  the COVID-19 declaration under Section 564(b)(1) of the A ct, 21 U.S.C. section 360bbb-3(b)(1), unless the authorization is terminated or revoked sooner.     Influenza A by PCR NEGATIVE NEGATIVE Final   Influenza B by PCR NEGATIVE NEGATIVE Final    Comment: (NOTE) The Xpert Xpress SARS-CoV-2/FLU/RSV plus assay is intended as an aid in the diagnosis of influenza from Nasopharyngeal swab specimens and should not be used as a sole basis for treatment. Nasal washings and aspirates are unacceptable for Xpert Xpress SARS-CoV-2/FLU/RSV testing.  Fact Sheet for Patients: EntrepreneurPulse.com.au  Fact Sheet for Healthcare Providers: IncredibleEmployment.be  This test is not yet approved or cleared by the [place] FDA and has been authorized for detection and/or diagnosis of SARS-CoV-2 by FDA under an Emergency Use Authorization (EUA). This EUA will remain in effect (meaning this test can be used) for the duration of the COVID-19 declaration under Section 564(b)(1) of the Act, 21 U.S.C. section  360bbb-3(b)(1), unless the authorization is terminated or revoked.  Performed at [facility], [address] [place], [place] [address]   Culture, blood (routine x 2)     Status: None   Collection Time: 04/08/21  5:46 AM   Specimen: BLOOD  Result Value Ref Range Status   Specimen Description BLOOD RIGHT HAND  Final   Special Requests   Final    BOTTLES DRAWN AEROBIC AND ANAEROBIC Blood Culture adequate volume   Culture   Final    NO GROWTH 5 DAYS Performed at [facility] [address] [place], [place] [address]    Report Status 04/13/2021 FINAL  Final  MRSA Next Gen by PCR, Nasal     Status: None   Collection Time: 04/08/21  3:16 PM   Specimen: Nasal Mucosa; Nasal Swab  Result Value Ref Range Status   MRSA by PCR Next  Gen NOT DETECTED NOT DETECTED Final    Comment: (NOTE) The GeneXpert MRSA Assay (FDA approved for NASAL specimens only), is one component of a comprehensive MRSA colonization surveillance program. It is not intended to diagnose MRSA infection nor to guide or monitor treatment for MRSA infections. Test performance is not FDA approved in patients less than 79 years old. Performed at [facility], [address] [place], [place] [address]     Coagulation Studies: No results for input(s): LABPROT, INR in the last 72 hours.  Urinalysis: No results for input(s): COLORURINE, LABSPEC, PHURINE, GLUCOSEU, HGBUR, BILIRUBINUR, KETONESUR, PROTEINUR, UROBILINOGEN, NITRITE, LEUKOCYTESUR in the last 72 hours.  Invalid input(s): APPERANCEUR     Imaging: No results found.   Medications:    sodium chloride      vitamin C  500 mg Oral Daily   atorvastatin  40 mg Oral Daily   calcitRIOL  0.5 mcg Oral Daily   calcium carbonate  1,250 mg Oral BID WC   Chlorhexidine Gluconate Cloth  6 each Topical Daily   epoetin (EPOGEN/PROCRIT) injection  10,000 Units Subcutaneous Q T,Th,Sa-HD   feeding supplement (NEPRO CARB STEADY)  237 mL Oral  TID BM   heparin injection (subcutaneous)  5,000 Units Subcutaneous Q8H   hydrALAZINE  10 mg Oral Q8H   magnesium oxide  400 mg Oral Daily   metoprolol succinate  50 mg Oral Daily   multivitamin  1 tablet Oral QHS   sacubitril-valsartan  1 tablet Oral BID   sucroferric oxyhydroxide  1,000 mg Oral TID WC   zinc sulfate  220 mg Oral Daily   sodium chloride, acetaminophen, albuterol, guaiFENesin-dextromethorphan, ondansetron **OR** ondansetron (ZOFRAN) IV, oxyCODONE  Assessment/ Plan:  Anthony Hess is a 66 y.o.  male with past medical conditions including bladder cancer, CAD, hypertension, and end-stage renal disease on hemodialysis.  Patient presents to the emergency department with complaints of abdominal pain.  Patient has been admitted for Hypocalcemia [E83.51] Hypokalemia [E87.6] SBP (spontaneous bacterial peritonitis) ([place]) [K65.2] Peritonitis associated with peritoneal dialysis ([place]) [T85.71XA] Abdominal pain, unspecified abdominal location [R10.9]  [facility]/TTS/ Rt Permcath  End-stage renal disease on hemodialysis.  Will maintain outpatient schedule if possible.  Received dialysis yesterday with UF 652 mL achieved.  Next treatment scheduled for Thursday.  2. Anemia of chronic kidney disease Normocytic  Lab Results  Component Value Date   HGB 8.5 (L) 04/15/2021    Hemoglobin not at goal Mircera prescribed outpatient EPO 10,000 units IV with dialysis treatments  3. Secondary Hyperparathyroidism:  Lab Results  Component Value Date   PTH 159 (H) 12/27/2019   CALCIUM 6.8 (L) 04/14/2021   CAION 0.74 (LL) 01/20/2021   PHOS 7.5 (H) 04/14/2021  We will obtain updated lab work in a.m.  Continue calcium carbonate and calcitriol  4.  Hypertension with chronic kidney disease.  Outpatient regimen includes hydralazine, irbesartan, and metoprolol. Irbesartan held at this time. Now on Entresto.     BP 107/88.  5.  Peritonitis associated with peritoneal dialysis. Continue  vancomycin and Rocephin. Laparoscopic peritoneal biopsy scheduled for Thursday morning . 6.  COVID-19 positive Positive finding on 04/08/2021.  S/P remdesivir and continue  supportive care therapies.  Isolation discontinued on 04/13/2021.   LOS: 7 [place] 2/22/20233:25 PM

## 2021-04-15 NOTE — H&P (View-Only) (Signed)
Subjective:  CC: Anthony Hess is a 66 y.o. male  Hospital stay day 7,   abdominal pain  HPI: No acute issues overnight.  ROS:  General: Denies weight loss, weight gain, fatigue, fevers, chills, and night sweats. Heart: Denies chest pain, palpitations, racing heart, irregular heartbeat, leg pain or swelling, and decreased activity tolerance. Respiratory: Denies breathing difficulty, shortness of breath, wheezing, cough, and sputum. GI: Denies change in appetite, heartburn, nausea, vomiting, constipation, diarrhea, and blood in stool. GU: Denies difficulty urinating, pain with urinating, urgency, frequency, blood in urine.   Objective:   Temp:  [97.4 F (36.3 C)-98.6 F (37 C)] 97.4 F (36.3 C) (02/22 0757) Pulse Rate:  [65-87] 69 (02/22 0757) Resp:  [16-20] 18 (02/22 0757) BP: (101-131)/(61-98) 124/98 (02/22 0757) SpO2:  [93 %-100 %] 100 % (02/22 0757)     Height: 5\' 3"  (160 cm) Weight: 54.3 kg BMI (Calculated): 21.21   Intake/Output this shift:   Intake/Output Summary (Last 24 hours) at 04/15/2021 1322 Last data filed at 04/14/2021 2110 Gross per 24 hour  Intake 120 ml  Output --  Net 120 ml    Constitutional :  alert, cooperative, appears stated age, and no distress  Respiratory:  clear to auscultation bilaterally  Cardiovascular:  regular rate and rhythm  Gastrointestinal: Soft, some TTP, no guarding .   Skin: Cool and moist. Incisions from previous abdominal catheter placement  Psychiatric: Normal affect, non-agitated, not confused       LABS:  CMP Latest Ref Rng & Units 04/15/2021 04/14/2021 04/11/2021  Glucose 70 - 99 mg/dL - 82 84  BUN 8 - 23 mg/dL - 64(H) 54(H)  Creatinine 0.61 - 1.24 mg/dL 5.71(H) 8.45(H) 7.58(H)  Sodium 135 - 145 mmol/L - 140 142  Potassium 3.5 - 5.1 mmol/L - 4.7 4.4  Chloride 98 - 111 mmol/L - 104 105  CO2 22 - 32 mmol/L - 23 22  Calcium 8.9 - 10.3 mg/dL - 6.8(L) 6.3(LL)  Total Protein 6.5 - 8.1 g/dL - - -  Total Bilirubin 0.3 - 1.2 mg/dL -  - -  Alkaline Phos 38 - 126 U/L - - -  AST 15 - 41 U/L - - -  ALT 0 - 44 U/L - - -   CBC Latest Ref Rng & Units 04/15/2021 04/09/2021 04/07/2021  WBC 4.0 - 10.5 K/uL 6.0 6.3 5.1  Hemoglobin 13.0 - 17.0 g/dL 8.5(L) 8.7(L) 8.7(L)  Hematocrit 39.0 - 52.0 % 29.3(L) 29.7(L) 29.8(L)  Platelets 150 - 400 K/uL 219 183 177    RADS: N/a Assessment:   Abdominal pain, chronic.  Etiology still unclear thus far.  Request by ID, nephro for diagnostic laparoscopyc,  peritoneal fluid analysis and peritoneal biopsy to further investigate cause of symptoms.  IR unsuccesful at finding window for paracentesis.  Discussed the risk of surgery including post-op infxn, bleeding, poor-delayed wound healing, need for additional procedures to address said risks.  The risks of general anesthetic including MI, CVA, sudden death or even reaction to anesthetic medications also discussed. Alternatives include continued observation. Benefits include possible diagnostic workup.  The patient understands the risks, any and all questions were answered to the patient's satisfaction.  To OR for diagnostic laparoscopy  labs/images/medications/previous chart entries reviewed personally and relevant changes/updates noted above.

## 2021-04-15 NOTE — Assessment & Plan Note (Signed)
Patient continued to have abdominal pain, seems chronic.  Infectious diseases following and they discontinue antibiotics. -Plan is to have a peritoneal biopsy done by general surgery on Thursday morning, need to rule out lymphoma/TB/any other atypical infection.-Appreciate ID help. -Continue with pain management.

## 2021-04-15 NOTE — Progress Notes (Signed)
Progress Note   Patient: Anthony Hess PFX:[id number] DOB: 11-08-1955 DOA: 04/08/2021     7 DOS: the patient was seen and examined on 04/15/2021   Brief hospital course: Taken from prior notes.  Anthony Hess is a 66 y.o. male with a PMH significant for ESRD on PD previously and now HD TThS. They presented from home to the ED on 04/08/2021 with diffuse abdominal pain x 7 days. He had his non-functioning PD catheter removed about a week ago.  On another note, he has been receiving HD through a tunneled cath. In the ED, it was found that they had mild ascites, peritonitis on CT scan. Signs of enteritis also present.  They were treated with IV Abx and supportive care.  ID was also consulted, they are recommending stopping antibiotics and doing a peritoneal biopsy as there is no sufficient ascites for paracentesis. Surgery was consulted and plan is to do a peritoneal biopsy to rule out lymphoma, TB or any other atypical infection due to persistent pain on Thursday morning.  Nephrology was consulted for routine HD, patient is having regular dialysis on TTS.  He was also tested positive for COVID-19, remained asymptomatic and complete isolation.   Assessment and Plan: * Peritonitis associated with peritoneal dialysis ([place])- (present on admission) Patient continued to have abdominal pain, seems chronic.  Infectious diseases following and they discontinue antibiotics. -Plan is to have a peritoneal biopsy done by general surgery on Thursday morning, need to rule out lymphoma/TB/any other atypical infection.-Appreciate ID help. -Continue with pain management.  ESRD (end stage renal disease) ([place])- (present on admission) Patient had her dialysis today. Nephrology is following. -Continue routine dialysis -Continue with Velphoro and calcium carbonate supplement  Anemia in ESRD (end-stage renal disease) ([place])- (present on admission) Hemoglobin seems stable. -Continue to monitor -Transfuse if below  7  COVID-19 virus infection Patient received remdesivir.  Now off the isolation. Remained on room air. -Supportive care  Hypertension- (present on admission) Blood pressure within goal. -Continue home dose of hydralazine, metoprolol and Entresto   Subjective: Patient continued to complain about sore belly.  No nausea or vomiting.  He was resting comfortably when seen today.  He was requesting to restart morphine, I explained to him that in order to discharge him we cannot do IV pain medications.  Oxycodone should be working fine for him, patient agrees to give it a try.  Physical Exam: Vitals:   04/14/21 2109 04/15/21 0520 04/15/21 0757 04/15/21 1524  BP: 118/85 (!) 131/97 (!) 124/98 107/88  Pulse: 82 65 69 69  Resp: 16 20 18 18   Temp: 98.6 F (37 C) 97.7 F (36.5 C) (!) 97.4 F (36.3 C) 98.7 F (37.1 C)  TempSrc: Oral Oral Oral Oral  SpO2: 95% 95% 100% 98%  Weight:      Height:       General.  Malnourished gentleman, in no acute distress. Pulmonary.  Lungs clear bilaterally, normal respiratory effort. CV.  Regular rate and rhythm, no JVD, rub or murmur. Abdomen.  Soft, nontender, nondistended, BS positive. CNS.  Alert and oriented .  No focal neurologic deficit. Extremities.  No edema, no cyanosis, pulses intact and symmetrical. Psychiatry.  Judgment and insight appears normal.  Data Reviewed: Reviewed other notes and labs.  Family Communication: Discussed with patient  Disposition: Status is: Inpatient Remains inpatient appropriate because: Severity of illness  Planned Discharge Destination: Home with Home Health  DVT prophylaxis.  Subcu heparin  Time spent: 40 minutes  This record has been  created using Systems analyst. Errors have been sought and corrected,but may not always be located. Such creation errors do not reflect on the standard of care.  Author: Lorella Nimrod, MD 04/15/2021 3:37 PM  For on call review [email or web address].

## 2021-04-15 NOTE — Progress Notes (Signed)
Subjective:  CC: Anthony Hess is a 66 y.o. male  Hospital stay day 7,   abdominal pain  HPI: No acute issues overnight.  ROS:  General: Denies weight loss, weight gain, fatigue, fevers, chills, and night sweats. Heart: Denies chest pain, palpitations, racing heart, irregular heartbeat, leg pain or swelling, and decreased activity tolerance. Respiratory: Denies breathing difficulty, shortness of breath, wheezing, cough, and sputum. GI: Denies change in appetite, heartburn, nausea, vomiting, constipation, diarrhea, and blood in stool. GU: Denies difficulty urinating, pain with urinating, urgency, frequency, blood in urine.   Objective:   Temp:  [97.4 F (36.3 C)-98.6 F (37 C)] 97.4 F (36.3 C) (02/22 0757) Pulse Rate:  [65-87] 69 (02/22 0757) Resp:  [16-20] 18 (02/22 0757) BP: (101-131)/(61-98) 124/98 (02/22 0757) SpO2:  [93 %-100 %] 100 % (02/22 0757)     Height: 5\' 3"  (160 cm) Weight: 54.3 kg BMI (Calculated): 21.21   Intake/Output this shift:   Intake/Output Summary (Last 24 hours) at 04/15/2021 1322 Last data filed at 04/14/2021 2110 Gross per 24 hour  Intake 120 ml  Output --  Net 120 ml    Constitutional :  alert, cooperative, appears stated age, and no distress  Respiratory:  clear to auscultation bilaterally  Cardiovascular:  regular rate and rhythm  Gastrointestinal: Soft, some TTP, no guarding .   Skin: Cool and moist. Incisions from previous abdominal catheter placement  Psychiatric: Normal affect, non-agitated, not confused       LABS:  CMP Latest Ref Rng & Units 04/15/2021 04/14/2021 04/11/2021  Glucose 70 - 99 mg/dL - 82 84  BUN 8 - 23 mg/dL - 64(H) 54(H)  Creatinine 0.61 - 1.24 mg/dL 5.71(H) 8.45(H) 7.58(H)  Sodium 135 - 145 mmol/L - 140 142  Potassium 3.5 - 5.1 mmol/L - 4.7 4.4  Chloride 98 - 111 mmol/L - 104 105  CO2 22 - 32 mmol/L - 23 22  Calcium 8.9 - 10.3 mg/dL - 6.8(L) 6.3(LL)  Total Protein 6.5 - 8.1 g/dL - - -  Total Bilirubin 0.3 - 1.2 mg/dL -  - -  Alkaline Phos 38 - 126 U/L - - -  AST 15 - 41 U/L - - -  ALT 0 - 44 U/L - - -   CBC Latest Ref Rng & Units 04/15/2021 04/09/2021 04/07/2021  WBC 4.0 - 10.5 K/uL 6.0 6.3 5.1  Hemoglobin 13.0 - 17.0 g/dL 8.5(L) 8.7(L) 8.7(L)  Hematocrit 39.0 - 52.0 % 29.3(L) 29.7(L) 29.8(L)  Platelets 150 - 400 K/uL 219 183 177    RADS: N/a Assessment:   Abdominal pain, chronic.  Etiology still unclear thus far.  Request by ID, nephro for diagnostic laparoscopyc,  peritoneal fluid analysis and peritoneal biopsy to further investigate cause of symptoms.  IR unsuccesful at finding window for paracentesis.  Discussed the risk of surgery including post-op infxn, bleeding, poor-delayed wound healing, need for additional procedures to address said risks.  The risks of general anesthetic including MI, CVA, sudden death or even reaction to anesthetic medications also discussed. Alternatives include continued observation. Benefits include possible diagnostic workup.  The patient understands the risks, any and all questions were answered to the patient's satisfaction.  To OR for diagnostic laparoscopy  labs/images/medications/previous chart entries reviewed personally and relevant changes/updates noted above.

## 2021-04-16 ENCOUNTER — Other Ambulatory Visit: Payer: Self-pay

## 2021-04-16 ENCOUNTER — Inpatient Hospital Stay: Payer: Medicare Other | Admitting: Anesthesiology

## 2021-04-16 ENCOUNTER — Encounter
Admission: EM | Disposition: A | Discharge status: Home / Self Care | Payer: Self-pay | Source: Home / Self Care | Attending: Student in an Organized Health Care Education/Training Program

## 2021-04-16 DIAGNOSIS — R1084 Generalized abdominal pain: Secondary | ICD-10-CM | POA: Diagnosis not present

## 2021-04-16 DIAGNOSIS — T8571XD Infection and inflammatory reaction due to peritoneal dialysis catheter, subsequent encounter: Secondary | ICD-10-CM | POA: Diagnosis not present

## 2021-04-16 HISTORY — PX: LAPAROSCOPY: SHX197

## 2021-04-16 LAB — RENAL FUNCTION PANEL
Albumin: 2.3 g/dL — ABNORMAL LOW (ref 3.5–5.0)
Anion gap: 13 (ref 5–15)
BUN: 63 mg/dL — ABNORMAL HIGH (ref 8–23)
CO2: 24 mmol/L (ref 22–32)
Calcium: 7.3 mg/dL — ABNORMAL LOW (ref 8.9–10.3)
Chloride: 105 mmol/L (ref 98–111)
Creatinine, Ser: 7.84 mg/dL — ABNORMAL HIGH (ref 0.61–1.24)
GFR, Estimated: 7 mL/min — ABNORMAL LOW (ref >60)
Glucose, Bld: 84 mg/dL (ref 70–99)
Phosphorus: 6.9 mg/dL — ABNORMAL HIGH (ref 2.5–4.6)
Potassium: 4.2 mmol/L (ref 3.5–5.1)
Sodium: 142 mmol/L (ref 135–145)

## 2021-04-16 SURGERY — LAPAROSCOPY, DIAGNOSTIC
Anesthesia: General | Site: Abdomen

## 2021-04-16 MED ORDER — DEXAMETHASONE SODIUM PHOSPHATE 10 MG/ML IJ SOLN
INTRAMUSCULAR | Status: DC | PRN
Start: 2021-04-16 — End: 2021-04-16
  Administered 2021-04-16: 10 mg via INTRAVENOUS

## 2021-04-16 MED ORDER — NOREPINEPHRINE 4 MG/250ML-% IV SOLN
INTRAVENOUS | Status: AC
  Filled 2021-04-16: qty 250

## 2021-04-16 MED ORDER — NOREPINEPHRINE 4 MG/250ML-% IV SOLN
INTRAVENOUS | Status: DC | PRN
  Administered 2021-04-16: 4 ug/min via INTRAVENOUS

## 2021-04-16 MED ORDER — HEPARIN SODIUM (PORCINE) 1000 UNIT/ML IJ SOLN
INTRAMUSCULAR | Status: AC
  Administered 2021-04-16: 3800 [IU]
  Filled 2021-04-16: qty 10

## 2021-04-16 MED ORDER — ROCURONIUM BROMIDE 100 MG/10ML IV SOLN
INTRAVENOUS | Status: DC | PRN
  Administered 2021-04-16: 30 mg via INTRAVENOUS

## 2021-04-16 MED ORDER — ASPIRIN EC 81 MG PO TBEC
81.0000 mg | DELAYED_RELEASE_TABLET | Freq: Every day | ORAL | Status: DC
  Administered 2021-04-17: 81 mg via ORAL
  Filled 2021-04-16: qty 1

## 2021-04-16 MED ORDER — SODIUM CHLORIDE 0.9 % IV BOLUS
500.0000 mL | Freq: Once | INTRAVENOUS | Status: AC
  Administered 2021-04-16: 500 mL via INTRAVENOUS

## 2021-04-16 MED ORDER — HEPARIN SODIUM (PORCINE) 5000 UNIT/ML IJ SOLN
5000.0000 [IU] | Freq: Three times a day (TID) | INTRAMUSCULAR | Status: DC
  Administered 2021-04-17: 5000 [IU] via SUBCUTANEOUS
  Filled 2021-04-16: qty 1

## 2021-04-16 MED ORDER — SODIUM CHLORIDE 0.9 % IV SOLN: INTRAVENOUS | Status: DC | PRN

## 2021-04-16 MED ORDER — LIDOCAINE HCL (CARDIAC) PF 100 MG/5ML IV SOSY
PREFILLED_SYRINGE | INTRAVENOUS | Status: DC | PRN
Start: 2021-04-16 — End: 2021-04-16
  Administered 2021-04-16: 100 mg via INTRAVENOUS

## 2021-04-16 MED ORDER — GLYCOPYRROLATE 0.2 MG/ML IJ SOLN
INTRAMUSCULAR | Status: DC | PRN
Start: 2021-04-16 — End: 2021-04-16
  Administered 2021-04-16: .2 mg via INTRAVENOUS

## 2021-04-16 MED ORDER — PHENYLEPHRINE HCL-NACL 20-0.9 MG/250ML-% IV SOLN
INTRAVENOUS | Status: AC
  Filled 2021-04-16: qty 250

## 2021-04-16 MED ORDER — NEPRO/CARBSTEADY PO LIQD
237.0000 mL | Freq: Three times a day (TID) | ORAL | Status: DC
Start: 2021-04-16 — End: 2021-04-17

## 2021-04-16 MED ORDER — SUCCINYLCHOLINE CHLORIDE 200 MG/10ML IV SOSY
PREFILLED_SYRINGE | INTRAVENOUS | Status: DC | PRN
  Administered 2021-04-16: 100 mg via INTRAVENOUS

## 2021-04-16 MED ORDER — 0.9 % SODIUM CHLORIDE (POUR BTL) OPTIME
TOPICAL | Status: DC | PRN
Start: 2021-04-16 — End: 2021-04-16
  Administered 2021-04-16: 500 mL

## 2021-04-16 MED ORDER — LIDOCAINE HCL 1 % IJ SOLN
INTRAMUSCULAR | Status: DC | PRN
  Administered 2021-04-16: 8 mL via INTRAMUSCULAR

## 2021-04-16 MED ORDER — ETOMIDATE 2 MG/ML IV SOLN
INTRAVENOUS | Status: DC | PRN
  Administered 2021-04-16: 20 mg via INTRAVENOUS

## 2021-04-16 MED ORDER — LIDOCAINE HCL (PF) 1 % IJ SOLN
INTRAMUSCULAR | Status: AC
  Filled 2021-04-16: qty 30

## 2021-04-16 MED ORDER — CEFAZOLIN SODIUM-DEXTROSE 2-3 GM-%(50ML) IV SOLR
INTRAVENOUS | Status: DC | PRN
  Administered 2021-04-16: 2 g via INTRAVENOUS

## 2021-04-16 MED ORDER — BUPIVACAINE-EPINEPHRINE (PF) 0.5% -1:200000 IJ SOLN
INTRAMUSCULAR | Status: AC
  Filled 2021-04-16: qty 30

## 2021-04-16 MED ORDER — ACETAMINOPHEN 500 MG PO TABS
1000.0000 mg | ORAL_TABLET | Freq: Once | ORAL | Status: AC
  Administered 2021-04-16: 1000 mg via ORAL
  Filled 2021-04-16: qty 2

## 2021-04-16 SURGICAL SUPPLY — 45 items
"PENCIL ELECTRO HAND CTR " (MISCELLANEOUS) ×1 IMPLANT
BLADE SURG SZ11 CARB STEEL (BLADE) ×2 IMPLANT
CHLORAPREP W/TINT 26 (MISCELLANEOUS) ×2 IMPLANT
DEFOGGER SCOPE WARMER CLEARIFY (MISCELLANEOUS) ×2 IMPLANT
DERMABOND ADVANCED (GAUZE/BANDAGES/DRESSINGS) ×1
DERMABOND ADVANCED .7 DNX12 (GAUZE/BANDAGES/DRESSINGS) ×1 IMPLANT
DRAPE LAPAROTOMY 100X77 ABD (DRAPES) ×1 IMPLANT
ELECT CAUTERY BLADE 6.4 (BLADE) ×2 IMPLANT
ELECT REM PT RETURN 9FT ADLT (ELECTROSURGICAL) ×2 IMPLANT
ELECTRODE REM PT RTRN 9FT ADLT (ELECTROSURGICAL) ×1 IMPLANT
GLOVE SURG SYN 6.5 ES PF (GLOVE) ×2 IMPLANT
GLOVE SURG SYN 6.5 PF PI (GLOVE) ×1 IMPLANT
GLOVE SURG UNDER POLY LF SZ7 (GLOVE) ×2 IMPLANT
GOWN STRL REUS W/ TWL LRG LVL3 (GOWN DISPOSABLE) ×2 IMPLANT
GOWN STRL REUS W/TWL LRG LVL3 (GOWN DISPOSABLE) ×2
GRASPER SUT TROCAR 14GX15 (MISCELLANEOUS) IMPLANT
IRRIGATION STRYKERFLOW (MISCELLANEOUS) IMPLANT
IRRIGATOR STRYKERFLOW (MISCELLANEOUS) ×2 IMPLANT
IV NS 1000ML (IV SOLUTION) ×1
IV NS 1000ML BAXH (IV SOLUTION) IMPLANT
KIT TURNOVER KIT A (KITS) ×2 IMPLANT
MANIFOLD NEPTUNE II (INSTRUMENTS) ×2 IMPLANT
NDL INSUFFLATION 14GA 120MM (NEEDLE) ×1 IMPLANT
NEEDLE HYPO 22GX1.5 SAFETY (NEEDLE) ×2 IMPLANT
NEEDLE INSUFFLATION 14GA 120MM (NEEDLE) ×2 IMPLANT
PACK LAP CHOLECYSTECTOMY (MISCELLANEOUS) ×2 IMPLANT
PENCIL ELECTRO HAND CTR (MISCELLANEOUS) ×2 IMPLANT
SCISSORS METZENBAUM CVD 33 (INSTRUMENTS) IMPLANT
SET TUBE SMOKE EVAC HIGH FLOW (TUBING) ×2 IMPLANT
SLEEVE ENDOPATH XCEL 5M (ENDOMECHANICALS) ×4 IMPLANT
SPIKE FLUID TRANSFER (MISCELLANEOUS) ×4 IMPLANT
SUT MNCRL 4-0 (SUTURE) ×1
SUT MNCRL 4-0 27 PS-2 XMFL (SUTURE) ×1 IMPLANT
SUT MNCRL 4-0 27XMFL (SUTURE) ×1
SUT VIC AB 3-0 SH 27 (SUTURE) ×1
SUT VIC AB 3-0 SH 27X BRD (SUTURE) ×1 IMPLANT
SUT VICRYL 0 AB UR-6 (SUTURE) ×2 IMPLANT
SUTURE MNCRL 4-0 27XMF (SUTURE) ×1 IMPLANT
SYSTEM WECK SHIELD CLOSURE (TROCAR) IMPLANT
TRAP SPECIMEN MUCUS 40CC (MISCELLANEOUS) ×1 IMPLANT
TRAY FOLEY SLVR 16FR LF STAT (SET/KITS/TRAYS/PACK) IMPLANT
TROCAR XCEL BLUNT TIP 100MML (ENDOMECHANICALS) IMPLANT
TROCAR XCEL NON-BLD 11X100MML (ENDOMECHANICALS) IMPLANT
TROCAR XCEL NON-BLD 5MMX100MML (ENDOMECHANICALS) ×2 IMPLANT
WATER STERILE IRR 500ML POUR (IV SOLUTION) ×2 IMPLANT

## 2021-04-16 NOTE — Anesthesia Procedure Notes (Signed)
Procedure Name: Intubation Date/Time: 04/16/2021 9:46 AM Performed by: Beverely Low, CRNA Pre-anesthesia Checklist: Patient identified, Patient being monitored, Timeout performed, Emergency Drugs available and Suction available Patient Re-evaluated:Patient Re-evaluated prior to induction Oxygen Delivery Method: Circle system utilized Preoxygenation: Pre-oxygenation with 100% oxygen Induction Type: IV induction Ventilation: Mask ventilation without difficulty Laryngoscope Size: Mac and 3 Grade View: Grade I Tube type: Oral Tube size: 7.0 mm Number of attempts: 1 Airway Equipment and Method: Stylet Placement Confirmation: ETT inserted through vocal cords under direct vision, positive ETCO2 and breath sounds checked- equal and bilateral Secured at: 21 cm Tube secured with: Tape Dental Injury: Teeth and Oropharynx as per pre-operative assessment

## 2021-04-16 NOTE — Progress Notes (Signed)
Progress Note   Patient: Anthony Hess SEG:[id number] DOB: 11-20-1955 DOA: 04/08/2021     8 DOS: the patient was seen and examined on 04/16/2021   Brief hospital course: Taken from prior notes.  Anthony Hess is a 66 y.o. male with a PMH significant for ESRD on PD previously and now HD TThS. They presented from home to the ED on 04/08/2021 with diffuse abdominal pain x 7 days. He had his non-functioning PD catheter removed about a week ago.  On another note, he has been receiving HD through a tunneled cath. In the ED, it was found that they had mild ascites, peritonitis on CT scan. Signs of enteritis also present.  They were treated with IV Abx and supportive care.  ID was also consulted, they are recommending stopping antibiotics and doing a peritoneal biopsy as there is no sufficient ascites for paracentesis. Surgery was consulted and plan is to do a peritoneal biopsy to rule out lymphoma, TB or any other atypical infection due to persistent pain on Thursday morning.  Nephrology was consulted for routine HD, patient is having regular dialysis on TTS.  He was also tested positive for COVID-19, remained asymptomatic.  Per ID note patient needs total of 10 days of isolation, today day 8.  Patient underwent diagnostic laparoscopic evaluation and peritoneal biopsies today with general surgery.  Tolerated the procedure well.  Developed mild hypotension after the procedure, responded well to IV fluid.   Assessment and Plan: * Peritonitis associated with peritoneal dialysis ([place])- (present on admission) Patient continued to have abdominal pain, seems chronic.  Infectious diseases following and they discontinue antibiotics. Patient underwent diagnostic laparoscopic evaluation with peritoneal biopsies with general surgery earlier today.  Tolerated the procedure well. -Biopsies were sent for pathology and culture to rule out lymphoma/fungal infection/any other atypical infection -Continue with pain  management. -Most likely be discharged tomorrow for outpatient follow-up for final recommendations  ESRD (end stage renal disease) ([place])- (present on admission) Patient had her dialysis today. Nephrology is following. -Continue routine dialysis -Continue with Velphoro and calcium carbonate supplement  Anemia in ESRD (end-stage renal disease) ([place])- (present on admission) Hemoglobin seems stable. -Continue to monitor -Transfuse if below 7  COVID-19 virus infection Patient received remdesivir. Remained on room air. Per ID recommendation inpatient needs to be in isolation for 10 days -Supportive care  Hypertension- (present on admission) Developed mild hypotension after his surgical procedure requiring 1 L of bolus. -Holding home dose of hydralazine, metoprolol and Entresto  Subjective: Patient was seen and examined after the procedure today.  No new complaints.  He was asking for food.  Physical Exam: Vitals:   04/16/21 1109 04/16/21 1252 04/16/21 1434 04/16/21 1445  BP: (!) 81/60 (!) 85/57 94/69 90/66   Pulse: 66 66 65 62  Resp: 15  20 11   Temp: 97.9 F (36.6 C)  98.1 F (36.7 C)   TempSrc: Oral  Oral   SpO2: 93% 93% 95% 96%  Weight:   56.3 kg   Height:       General.     In no acute distress. Pulmonary.  Lungs clear bilaterally, normal respiratory effort. CV.  Regular rate and rhythm, no JVD, rub or murmur. Abdomen.  Soft, nontender, nondistended, BS positive. CNS.  Alert and oriented x3.  No focal neurologic deficit. Extremities.  No edema, no cyanosis, pulses intact and symmetrical. Psychiatry.  Judgment and insight appears normal.  Data Reviewed: I reviewed prior notes and labs.  Family Communication:   Disposition: Status is: Inpatient  Remains inpatient appropriate because: Severity of illness  Planned Discharge Destination: Home  DVT prophylaxis.  Subcu heparin  Time spent: 42 minutes  This record has been created using TEFL teacher. Errors have been sought and corrected,but may not always be located. Such creation errors do not reflect on the standard of care.  Author: Lorella Nimrod, MD 04/16/2021 2:49 PM  For on call review [email or web address].

## 2021-04-16 NOTE — Assessment & Plan Note (Signed)
Patient received remdesivir. Remained on room air. Per ID recommendation inpatient needs to be in isolation for 10 days -Supportive care

## 2021-04-16 NOTE — Discharge Instructions (Addendum)
biopsy, Care After This sheet gives you information about how to care for yourself after your procedure. Your health care provider may also give you more specific instructions. If you have problems or questions, contact your health care provider. What can I expect after the procedure? After the procedure, it is common to have: Soreness. Bruising. Itching. Follow these instructions at home: site care Follow instructions from your health care provider about how to take care of your site. Make sure you: Wash your hands with soap and water before and after you change your bandage (dressing). If soap and water are not available, use hand sanitizer. Leave stitches (sutures), skin glue, or adhesive strips in place. These skin closures may need to stay in place for 2 weeks or longer. If adhesive strip edges start to loosen and curl up, you may trim the loose edges. Do not remove adhesive strips completely unless your health care provider tells you to do that. If the area bleeds or bruises, apply gentle pressure for 10 minutes. OK TO SHOWER IN 24HRS  Check your site every day for signs of infection. Check for: Redness, swelling, or pain. Fluid or blood. Warmth. Pus or a bad smell.  General instructions Rest and then return to your normal activities as told by your health care provider.  tylenol and advil as needed for discomfort.  Please alternate between the two every four hours as needed for pain.    Use narcotics, if prescribed, only when tylenol and motrin is not enough to control pain.  325-650mg  every 8hrs to max of 3000mg /24hrs (including the 325mg  in every norco dose) for the tylenol.    Advil up to 800mg  per dose every 8hrs as needed for pain.   Keep all follow-up visits as told by your health care provider. This is important. Contact a health care provider if: You have redness, swelling, or pain around your site. You have fluid or blood coming from your site. Your site feels warm to  the touch. You have pus or a bad smell coming from your site. You have a fever. Your sutures, skin glue, or adhesive strips loosen or come off sooner than expected. Get help right away if: You have bleeding that does not stop with pressure or a dressing. Summary After the procedure, it is common to have some soreness, bruising, and itching at the site. Follow instructions from your health care provider about how to take care of your site. Check your site every day for signs of infection. Contact a health care provider if you have redness, swelling, or pain around your site, or your site feels warm to the touch. Keep all follow-up visits as told by your health care provider. This is important. This information is not intended to replace advice given to you by your health care provider. Make sure you discuss any questions you have with your health care provider. Document Released: 03/07/2015 Document Revised: 08/08/2017 Document Reviewed: 08/08/2017 Elsevier Interactive Patient Education  [employer].  t was pleasure taking care of you. We made some changes to your medications, decrease the dose of metoprolol, stop taking hydralazine and irbesartan, we added Entresto.  Please take it according to your new medication list. Please follow-up very closely with your doctors for further recommendations. Please follow-up with infectious disease and general surgery for your final biopsy results.

## 2021-04-16 NOTE — Assessment & Plan Note (Signed)
Patient continued to have abdominal pain, seems chronic.  Infectious diseases following and they discontinue antibiotics. Patient underwent diagnostic laparoscopic evaluation with peritoneal biopsies with general surgery earlier today.  Tolerated the procedure well. -Biopsies were sent for pathology and culture to rule out lymphoma/fungal infection/any other atypical infection -Continue with pain management. -Most likely be discharged tomorrow for outpatient follow-up for final recommendations

## 2021-04-16 NOTE — Transfer of Care (Signed)
Immediate Anesthesia Transfer of Care Note  Patient: Anthony Hess  Procedure(s) Performed: LAPAROSCOPY DIAGNOSTIC (Abdomen)  Patient Location: PACU and Nursing Unit  Anesthesia Type:General  Level of Consciousness: awake and alert   Airway & Oxygen Therapy: Patient Spontanous Breathing  Post-op Assessment: Report given to RN and Post -op Vital signs reviewed and stable  Post vital signs: Reviewed and stable  Last Vitals:  Vitals Value Taken Time  BP 81/60 04/16/21 1109  Temp 36.6 C 04/16/21 1109  Pulse 66 04/16/21 1109  Resp 15 04/16/21 1109  SpO2 93 % 04/16/21 1109    Last Pain:  Vitals:   04/16/21 1109  TempSrc: Oral  PainSc:       Patients Stated Pain Goal: 5 (78/93/81 0175)  Complications: No notable events documented.

## 2021-04-16 NOTE — Assessment & Plan Note (Signed)
Developed mild hypotension after his surgical procedure requiring 1 L of bolus. -Holding home dose of hydralazine, metoprolol and Entresto

## 2021-04-16 NOTE — Interval H&P Note (Signed)
History and Physical Interval Note:  04/16/2021 9:04 AM  Anthony Hess  has presented today for surgery, with the diagnosis of Abdominal pain, possible bacterial peritonitis.  The various methods of treatment have been discussed with the patient and family. After consideration of risks, benefits and other options for treatment, the patient has consented to  Procedure(s): LAPAROSCOPY DIAGNOSTIC (N/A) as a surgical intervention.  The patient's history has been reviewed, patient examined, no change in status, stable for surgery.  I have reviewed the patient's chart and labs.  Questions were answered to the patient's satisfaction.     [name]

## 2021-04-16 NOTE — Care Management Important Message (Signed)
Important Message  Patient Details  Name: Anthony Hess MRN: [id number] Date of Birth: 03/14/1955   Medicare Important Message Given:  Yes     Dannette Barbara 04/16/2021, 1:39 PM

## 2021-04-16 NOTE — Anesthesia Preprocedure Evaluation (Addendum)
Anesthesia Evaluation  Patient identified by MRN, date of birth, ID band Patient awake    Reviewed: Allergy & Precautions, NPO status , Patient's Chart, lab work & pertinent test results, reviewed documented beta blocker date and time   History of Anesthesia Complications Negative for: history of anesthetic complications  Airway Mallampati: III  TM Distance: >3 FB Neck ROM: full    Dental  (+) Chipped   Pulmonary pneumonia, COPD, former smoker,  positive for COVID-19, remained asymptomatic and complete isolation   Pulmonary exam normal breath sounds clear to auscultation       Cardiovascular hypertension, Pt. 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 - No T wave inversion was noted during stress. - Defect 1: There is a medium defect of moderate severity present in the basal anterior, basal anteroseptal, basal anterolateral, mid anterior, mid anteroseptal, mid anterolateral, apical anterior and apex location. - Findings consistent with prior myocardial infarction with peri-infarct ischemia. - This is a high risk study. - The left ventricular ejection fraction is severely decreased (<30%).  ECHO:  11/21 1. Left ventricular ejection fraction, by estimation, is 20 to 25%. The  left ventricle has severely decreased function. The left ventricle has no  regional wall motion abnormalities. The left ventricular internal cavity  size was moderately dilated. Left  ventricular diastolic parameters are consistent with Grade I diastolic  dysfunction (impaired relaxation).  2. Right  ventricular systolic function is normal. The right ventricular  size is normal.  3. The mitral valve is normal in structure. Moderate mitral valve  regurgitation. No evidence of mitral stenosis.  4. The aortic valve is normal in structure. Aortic valve regurgitation is  mild to moderate. No aortic stenosis is present.  5. The inferior vena cava is normal in size with greater than 50%  respiratory variability, suggesting right atrial pressure of 3 mmHg.   Neuro/Psych negative neurological ROS  negative psych ROS   GI/Hepatic Neg liver ROS, Abdominal pain, possible bacterial peritonitis   Endo/Other  negative endocrine ROSType 2  Renal/GU ESRF and DialysisRenal disease (ESRD on HD TThS)Bladder CA     Musculoskeletal   Abdominal   Peds  Hematology  (+) Blood dyscrasia, anemia ,   Anesthesia Other Findings Past Medical History: No date: Bladder tumor No date: Cancer [facility])     Comment:  Bladder No date: Chronic kidney disease     Comment:  renal insufficiency No date: Coronary artery disease No date: History of kidney stones No date: Hypertension No date: Myocardial infarction ([place]) 03/14/2021: Sepsis ([place]) No date: Wears glasses  Past Surgical History: 12/31/2019: CAPD INSERTION; N/A     Comment:  Procedure: LAPAROSCOPIC INSERTION CONTINUOUS AMBULATORY               PERITONEAL DIALYSIS  (CAPD) CATHETER;  Surgeon: Jules Husbands, MD;  Location: [facility];  Service: General;                Laterality: N/A; 04/10/2020: CAPD REMOVAL; N/A     Comment:  Procedure: LAPAROSCOPIC REVISION OF CONTINUOUS  AMBULATORY PERITONEAL DIALYSIS  (CAPD) CATHETER;                Surgeon: Jules Husbands, MD;  Location: [facility];                Service: General;  Laterality: N/A; 12/27/2018: CORONARY ARTERY BYPASS GRAFT; N/A     Comment:  Procedure: CORONARY ARTERY BYPASS GRAFTING (CABG) X 4 ON              PUMP USING RIGHT & LEFT INTERNAL MAMMARY ARTERY LEFT                RADIAL ARTERY ENDOSCOPICALLY HARVESTED;  Surgeon: [name], MD;  Location: [place];  Service: Open Heart               Surgery;  Laterality: N/A; 05/15/2019: CYSTOSCOPY W/ RETROGRADES; Bilateral     Comment:  Procedure: CYSTOSCOPY WITH RETROGRADE PYELOGRAM;                Surgeon: Abbie Sons, MD;  Location: [facility];                Service: Urology;  Laterality: Bilateral; 05/15/2019: CYSTOSCOPY WITH BIOPSY; N/A     Comment:  Procedure: CYSTOSCOPY WITH bladder BIOPSY;  Surgeon:               Abbie Sons, MD;  Location: [facility];  Service:               Urology;  Laterality: N/A; 12/28/2019: DIALYSIS/PERMA CATHETER INSERTION; N/A     Comment:  Procedure: DIALYSIS/PERMA CATHETER INSERTION;  Surgeon:               Algernon Huxley, MD;  Location: [place] [facility];                Service: Cardiovascular;  Laterality: N/A; 03/18/2021: DIALYSIS/PERMA CATHETER INSERTION; N/A     Comment:  Procedure: DIALYSIS/PERMA CATHETER INSERTION;  Surgeon:               Algernon Huxley, MD;  Location: [place] [facility];                Service: Cardiovascular;  Laterality: N/A; 06/02/2020: DIALYSIS/PERMA CATHETER REMOVAL; N/A     Comment:  Procedure: DIALYSIS/PERMA CATHETER REMOVAL;  Surgeon:               Algernon Huxley, MD;  Location: [place] [facility];                Service: Cardiovascular;  Laterality: N/A; 04/10/2020: EXCHANGE OF A DIALYSIS CATHETER; Right     Comment:  Procedure: EXCHANGE OF A DIALYSIS CATHETER;  Surgeon:               Jules Husbands, MD;  Location: [facility];  Service:               General;  Laterality: Right; 01/20/2021: INCISIONAL HERNIA REPAIR     Comment:  Procedure: HERNIA REPAIR INCISIONAL;  Surgeon: Olean Ree, MD;  Location: [facility];  Service: General;; 04/07/2020: IR IMAGE GUIDED DRAINAGE PERCUT CATH  PERITONEAL RETROPERIT 12/20/2018: LEFT HEART CATH AND CORONARY ANGIOGRAPHY; Left     Comment:  Procedure: LEFT HEART  CATH AND CORONARY ANGIOGRAPHY;  Surgeon: Isaias Cowman, MD;  Location: [place] [facility];  Service: Cardiovascular;  Laterality:               Left; 12/27/2018: RADIAL ARTERY HARVEST; Left     Comment:  Procedure: ENDOSCOPIC RADIAL ARTERY HARVEST;  Surgeon:               [name], MD;  Location: [place];  Service: Open               Heart Surgery;  Laterality: Left; 03/20/2021: REMOVAL OF A DIALYSIS CATHETER; Left     Comment:  Procedure: REMOVAL OF A PD CATHETER;  Surgeon: Algernon Huxley, MD;  Location: [facility];  Service: Vascular;                Laterality: Left; 12/27/2018: TEE WITHOUT CARDIOVERSION; N/A     Comment:  Procedure: TRANSESOPHAGEAL ECHOCARDIOGRAM (TEE);                Surgeon: [name], MD;  Location: [place];                Service: Open Heart Surgery;  Laterality: N/A; 2019: TUMOR REMOVAL     Comment:  Bladder  BMI    Body Mass Index: 21.21 kg/m      Reproductive/Obstetrics negative OB ROS                            Anesthesia Physical  Anesthesia Plan  ASA: 4  Anesthesia Plan: General ETT   Post-op Pain Management:    Induction: Intravenous  PONV Risk Score and Plan: 2 and Ondansetron, Dexamethasone, Midazolam and Treatment may vary due to age or medical condition  Airway Management Planned: Oral ETT  Additional Equipment:   Intra-op Plan:   Post-operative Plan:   Informed Consent:   Plan Discussed with: Anesthesiologist, CRNA and Surgeon  Anesthesia Plan Comments: (Patient consented for risks of anesthesia including but not limited to:  - adverse reactions to medications - damage to eyes, teeth, lips or other oral mucosa - nerve damage due to positioning  - sore throat or hoarseness - damage to heart, brain, nerves, lungs, other parts of body or loss of life  Informed patient about role of CRNA in peri- and intra-operative care.  Patient voiced  understanding.)        Anesthesia Quick Evaluation

## 2021-04-16 NOTE — Progress Notes (Signed)
[facility]  ROUNDING NOTE   Subjective:   Anthony Hess is a 66 year old male with past medical conditions including bladder cancer, CAD, hypertension, and end-stage renal disease on hemodialysis.  Patient presents to the emergency department with complaints of abdominal pain.  Patient has been admitted for Hypocalcemia [E83.51] Hypokalemia [E87.6] SBP (spontaneous bacterial peritonitis) ([place]) [K65.2] Peritonitis associated with peritoneal dialysis ([place]) [T85.71XA] Abdominal pain, unspecified abdominal location [R10.9]  Patient is known to our practice and receives outpatient dialysis treatments at [facility] on a TTS schedule, supervised by Dr. Holley Raring.    Patient seen resting quietly Currently n.p.o. for biopsy procedure Denies pain or discomfort at this time  Dialysis planned for later this afternoon   Objective:  Vital signs in last 24 hours:  Temp:  [97.9 F (36.6 C)-98.7 F (37.1 C)] 97.9 F (36.6 C) (02/23 1109) Pulse Rate:  [49-69] 66 (02/23 1252) Resp:  [15-18] 15 (02/23 1109) BP: (81-119)/(57-88) 85/57 (02/23 1252) SpO2:  [93 %-98 %] 93 % (02/23 1252)  Weight change:  Filed Weights   04/11/21 1905 04/14/21 0830 04/14/21 1230  Weight: 55.3 kg 55.4 kg 54.3 kg    Intake/Output: I/O last 3 completed shifts: In: 240 [P.O.:240] Out: -    Intake/Output this shift:  Total I/O In: -  Out: 1 [Blood:1]  Physical Exam: General: NAD, resting in bed  Head: Normocephalic, atraumatic. Moist oral mucosal membranes  Eyes: Anicteric  Lungs:  Clear to auscultation, normal effort  Heart: Regular rate and rhythm  Abdomen:  Soft, tender, mild distention  Extremities: No peripheral edema.  Neurologic: Nonfocal, moving all four extremities  Skin: No lesions  Access: Right IJ PermCath    Basic Metabolic Panel: Recent Labs  Lab 04/10/21 0434 04/11/21 0821 04/14/21 0808 04/15/21 0329 04/16/21 0800  NA 140 142 140  --  142  K 4.5 4.4 4.7  --  4.2   CL 103 105 104  --  105  CO2 28 22 23   --  24  GLUCOSE 100* 84 82  --  84  BUN 33* 54* 64*  --  63*  CREATININE 5.36* 7.58* 8.45* 5.71* 7.84*  CALCIUM 6.9* 6.3* 6.8*  --  7.3*  PHOS 6.4* 9.1* 7.5*  --  6.9*     Liver Function Tests: Recent Labs  Lab 04/10/21 0434 04/11/21 0821 04/14/21 0808 04/16/21 0800  ALBUMIN 2.1* 2.1* 2.0* 2.3*    No results for input(s): LIPASE, AMYLASE in the last 168 hours.  No results for input(s): AMMONIA in the last 168 hours.  CBC: Recent Labs  Lab 04/15/21 0329  WBC 6.0  HGB 8.5*  HCT 29.3*  MCV 89.1  PLT 219     Cardiac Enzymes: No results for input(s): CKTOTAL, CKMB, CKMBINDEX, TROPONINI in the last 168 hours.  BNP: Invalid input(s): POCBNP  CBG: No results for input(s): GLUCAP in the last 168 hours.  Microbiology: Results for orders placed or performed during the hospital encounter of 04/08/21  Culture, blood (routine x 2)     Status: None   Collection Time: 04/08/21  5:45 AM   Specimen: BLOOD  Result Value Ref Range Status   Specimen Description BLOOD LEFT HAND  Final   Special Requests   Final    BOTTLES DRAWN AEROBIC AND ANAEROBIC Blood Culture results may not be optimal due to an excessive volume of blood received in culture bottles   Culture   Final    NO GROWTH 5 DAYS Performed at [facility],  [place], [place] [address]    Report Status 04/13/2021 FINAL  Final  Resp Panel by RT-PCR (Flu A&B, Covid) Nasopharyngeal Swab     Status: Abnormal   Collection Time: 04/08/21  5:45 AM   Specimen: Nasopharyngeal Swab; Nasopharyngeal(NP) swabs in vial transport medium  Result Value Ref Range Status   SARS Coronavirus 2 by RT PCR POSITIVE (A) NEGATIVE Final    Comment: (NOTE) SARS-CoV-2 target nucleic acids are DETECTED.  The SARS-CoV-2 RNA is generally detectable in upper respiratory specimens during the acute phase of infection. Positive results are indicative of the presence of the  identified virus, but do not rule out bacterial infection or co-infection with other pathogens not detected by the test. Clinical correlation with patient history and other diagnostic information is necessary to determine patient infection status. The expected result is Negative.  Fact Sheet for Patients: EntrepreneurPulse.com.au  Fact Sheet for Healthcare Providers: IncredibleEmployment.be  This test is not yet approved or cleared by the [place] FDA and  has been authorized for detection and/or diagnosis of SARS-CoV-2 by FDA under an Emergency Use Authorization (EUA).  This EUA will remain in effect (meaning this test can be used) for the duration of  the COVID-19 declaration under Section 564(b)(1) of the A ct, 21 U.S.C. section 360bbb-3(b)(1), unless the authorization is terminated or revoked sooner.     Influenza A by PCR NEGATIVE NEGATIVE Final   Influenza B by PCR NEGATIVE NEGATIVE Final    Comment: (NOTE) The Xpert Xpress SARS-CoV-2/FLU/RSV plus assay is intended as an aid in the diagnosis of influenza from Nasopharyngeal swab specimens and should not be used as a sole basis for treatment. Nasal washings and aspirates are unacceptable for Xpert Xpress SARS-CoV-2/FLU/RSV testing.  Fact Sheet for Patients: EntrepreneurPulse.com.au  Fact Sheet for Healthcare Providers: IncredibleEmployment.be  This test is not yet approved or cleared by the [place] FDA and has been authorized for detection and/or diagnosis of SARS-CoV-2 by FDA under an Emergency Use Authorization (EUA). This EUA will remain in effect (meaning this test can be used) for the duration of the COVID-19 declaration under Section 564(b)(1) of the Act, 21 U.S.C. section 360bbb-3(b)(1), unless the authorization is terminated or revoked.  Performed at [facility], [address] [place], [place] [address]   Culture,  blood (routine x 2)     Status: None   Collection Time: 04/08/21  5:46 AM   Specimen: BLOOD  Result Value Ref Range Status   Specimen Description BLOOD RIGHT HAND  Final   Special Requests   Final    BOTTLES DRAWN AEROBIC AND ANAEROBIC Blood Culture adequate volume   Culture   Final    NO GROWTH 5 DAYS Performed at [facility], [address] [place], [place] [address]    Report Status 04/13/2021 FINAL  Final  MRSA Next Gen by PCR, Nasal     Status: None   Collection Time: 04/08/21  3:16 PM   Specimen: Nasal Mucosa; Nasal Swab  Result Value Ref Range Status   MRSA by PCR Next Gen NOT DETECTED NOT DETECTED Final    Comment: (NOTE) The GeneXpert MRSA Assay (FDA approved for NASAL specimens only), is one component of a comprehensive MRSA colonization surveillance program. It is not intended to diagnose MRSA infection nor to guide or monitor treatment for MRSA infections. Test performance is not FDA approved in patients less than 26 years old. Performed at [facility], [place], [place]  [address]     Coagulation Studies: No results for input(s): LABPROT, INR in the last 72 hours.  Urinalysis: No results for input(s): COLORURINE, LABSPEC, PHURINE, GLUCOSEU, HGBUR, BILIRUBINUR, KETONESUR, PROTEINUR, UROBILINOGEN, NITRITE, LEUKOCYTESUR in the last 72 hours.  Invalid input(s): APPERANCEUR     Imaging: No results found.   Medications:    sodium chloride      vitamin C  500 mg Oral Daily   [START ON 04/17/2021] aspirin EC  81 mg Oral Daily   atorvastatin  40 mg Oral Daily   calcitRIOL  0.5 mcg Oral Daily   calcium carbonate  1,250 mg Oral BID WC   Chlorhexidine Gluconate Cloth  6 each Topical Daily   epoetin (EPOGEN/PROCRIT) injection  10,000 Units Subcutaneous Q T,Th,Sa-HD   feeding supplement (NEPRO CARB STEADY)  237 mL Oral TID BM   [START ON 04/17/2021] heparin  5,000 Units Subcutaneous Q8H   hydrALAZINE  10 mg Oral Q8H   magnesium  oxide  400 mg Oral Daily   metoprolol succinate  50 mg Oral Daily   multivitamin  1 tablet Oral QHS   sacubitril-valsartan  1 tablet Oral BID   sucroferric oxyhydroxide  1,000 mg Oral TID WC   zinc sulfate  220 mg Oral Daily   sodium chloride, acetaminophen, albuterol, guaiFENesin-dextromethorphan, ondansetron **OR** ondansetron (ZOFRAN) IV, oxyCODONE  Assessment/ Plan:  Anthony Hess is a 66 y.o.  male with past medical conditions including bladder cancer, CAD, hypertension, and end-stage renal disease on hemodialysis.  Patient presents to the emergency department with complaints of abdominal pain.  Patient has been admitted for Hypocalcemia [E83.51] Hypokalemia [E87.6] SBP (spontaneous bacterial peritonitis) ([place]) [K65.2] Peritonitis associated with peritoneal dialysis ([place]) [T85.71XA] Abdominal pain, unspecified abdominal location [R10.9]  [facility]/TTS/ Rt Permcath  End-stage renal disease on hemodialysis.  Will maintain outpatient schedule if possible.  Dialysis scheduled for later today after procedure.  2. Anemia of chronic kidney disease Normocytic  Lab Results  Component Value Date   HGB 8.5 (L) 04/15/2021    Hemoglobin not at goal Mircera prescribed outpatient We will order EPO with dialysis treatments  3. Secondary Hyperparathyroidism:  Lab Results  Component Value Date   PTH 159 (H) 12/27/2019   CALCIUM 7.3 (L) 04/16/2021   CAION 0.74 (LL) 01/20/2021   PHOS 6.9 (H) 04/16/2021  Calcium starting to improve, will continue calcium 3 baths during dialysis.  Continue calcium carbonate and calcitriol  4.  Hypertension with chronic kidney disease.  Outpatient regimen includes hydralazine, irbesartan, and metoprolol. Irbesartan held at this time. Now on Entresto.     BP 109/71, stable for this patient  5.  Peritonitis associated with peritoneal dialysis. Continue vancomycin and Rocephin. Laparoscopic peritoneal biopsy scheduled for this morning . 6.  COVID-19  positive Positive finding on 04/08/2021.  S/P remdesivir and continue  supportive care therapies.  Isolation discontinued on 04/13/2021.   LOS: 8 [place] 2/23/20231:11 PM

## 2021-04-16 NOTE — Progress Notes (Signed)
COVID-19 isolation  Patient is currently day 8 of isolation, should remain on airborne/contact for 2 additional days per policy. Call IP if questions.  Elzie Rings [place] for Infectious Diseases [phone]

## 2021-04-16 NOTE — Progress Notes (Signed)
Hemodialysis notes  HD treatment completed. Tolerated well. No complications. Total treatment time 3 hours. No fluid removal.

## 2021-04-16 NOTE — Progress Notes (Signed)
ID Pt underwent lap guided peritoneal biopsy today Went for HD Doing okay  O/e in no distress BP 100/65 (BP Location: Right Arm)    Pulse 79    Temp 98.3 F (36.8 C) (Oral)    Resp 20    Ht 5\' 3"  (1.6 m)    Wt 55 kg    SpO2 93%    BMI 21.48 kg/m   Chest CTA [id number]- 2/6 systolic murmur Abd soft Lap sites are glued CNS non focal  Labs CBC    Component Value Date/Time   WBC 6.0 04/15/2021 0329   RBC 3.29 (L) 04/15/2021 0329   HGB 8.5 (L) 04/15/2021 0329   HGB 9.1 (L) 01/04/2020 1540   HCT 29.3 (L) 04/15/2021 0329   HCT 29.6 (L) 01/04/2020 1540   PLT 219 04/15/2021 0329   PLT 191 01/04/2020 1540   MCV 89.1 04/15/2021 0329   MCV 94 01/04/2020 1540   MCH 25.8 (L) 04/15/2021 0329   MCHC 29.0 (L) 04/15/2021 0329   RDW 17.4 (H) 04/15/2021 0329   RDW 16.2 (H) 01/04/2020 1540   LYMPHSABS 0.9 03/14/2021 2112   LYMPHSABS 1.0 01/04/2020 1540   MONOABS 1.4 (H) 03/14/2021 2112   EOSABS 0.2 03/14/2021 2112   EOSABS 0.5 (H) 03/27/2018 1643   BASOSABS 0.0 03/14/2021 2112   BASOSABS 0.0 03/27/2018 1643    CMP Latest Ref Rng & Units 04/16/2021 04/15/2021 04/14/2021  Glucose 70 - 99 mg/dL 84 - 82  BUN 8 - 23 mg/dL 63(H) - 64(H)  Creatinine 0.61 - 1.24 mg/dL 7.84(H) 5.71(H) 8.45(H)  Sodium 135 - 145 mmol/L 142 - 140  Potassium 3.5 - 5.1 mmol/L 4.2 - 4.7  Chloride 98 - 111 mmol/L 105 - 104  CO2 22 - 32 mmol/L 24 - 23  Calcium 8.9 - 10.3 mg/dL 7.3(L) - 6.8(L)  Total Protein 6.5 - 8.1 g/dL - - -  Total Bilirubin 0.3 - 1.2 mg/dL - - -  Alkaline Phos 38 - 126 U/L - - -  AST 15 - 41 U/L - - -  ALT 0 - 44 U/L - - -    Impression/recommendation Chronic abdominal pain for past 3 months- initially thought to be due to PD cath induced peritonitis- was treated with prolonged courses of antibiotics- cath was removed in Jan. Underwent diagnostic laparoscopy  and biopsies from peritoneum sent  for AFB, fungal, bacterial and histopathological examination.  No antibitoics currently- agree   ESRD on  HD   Anemia due to Renal disease   Discussed the management with patient and care team Follow all the above results ID will sign off- call if needed

## 2021-04-17 ENCOUNTER — Encounter: Payer: Self-pay | Admitting: Surgery

## 2021-04-17 DIAGNOSIS — T8571XD Infection and inflammatory reaction due to peritoneal dialysis catheter, subsequent encounter: Secondary | ICD-10-CM | POA: Diagnosis not present

## 2021-04-17 DIAGNOSIS — R1084 Generalized abdominal pain: Secondary | ICD-10-CM | POA: Diagnosis not present

## 2021-04-17 DIAGNOSIS — U071 COVID-19: Secondary | ICD-10-CM | POA: Diagnosis not present

## 2021-04-17 DIAGNOSIS — I10 Essential (primary) hypertension: Secondary | ICD-10-CM

## 2021-04-17 DIAGNOSIS — N186 End stage renal disease: Secondary | ICD-10-CM | POA: Diagnosis not present

## 2021-04-17 LAB — ACID FAST SMEAR (AFB, MYCOBACTERIA): Acid Fast Smear: NEGATIVE

## 2021-04-17 LAB — SURGICAL PATHOLOGY

## 2021-04-17 LAB — CYTOLOGY - NON PAP

## 2021-04-17 MED ORDER — SACUBITRIL-VALSARTAN 24-26 MG PO TABS
1.0000 | ORAL_TABLET | Freq: Two times a day (BID) | ORAL | 1 refills | Status: DC
Start: 2021-04-17 — End: 2021-11-18

## 2021-04-17 MED ORDER — METOPROLOL SUCCINATE ER 50 MG PO TB24: 25.0000 mg | ORAL_TABLET | Freq: Every day | ORAL | 1 refills | Status: DC

## 2021-04-17 MED ORDER — ASCORBIC ACID 500 MG PO TABS: 500.0000 mg | ORAL_TABLET | Freq: Every day | ORAL | 0 refills | Status: DC

## 2021-04-17 MED ORDER — ZINC SULFATE 220 (50 ZN) MG PO CAPS: 220.0000 mg | ORAL_CAPSULE | Freq: Every day | ORAL | 0 refills | Status: DC

## 2021-04-17 NOTE — Op Note (Addendum)
Preoperative diagnosis:  abdominal pain  Postoperative diagnosis: same as above  Procedure: diagnostic laparoscopy, ascite fluid aspiration and peritoneal biopsy  Anesthesia: GETA   Surgeon: Benjamine Sprague  Specimen: Gallbladder  Complications: None  EBL: minimal  Wound Classification: Clean   Indications: see HPI  Findings: Extensive adhesions throughout abdomen with pockets of straw colored fluid, exudative buildup.  No obvious mass or other pathology visible  Description of procedure:  The patient was placed on the operating table in the supine position. SCDs placed, pre-op abx administered.  General anesthesia was induced and OG tube placed by anesthesia. A time-out was completed verifying correct patient, procedure, site, positioning, and implant(s) and/or special equipment prior to beginning this procedure. The abdomen was prepped and draped in the usual sterile fashion.    Veress needle was placed at the Palmer's point and insufflation was started after confirming a positive saline drop test and no immediate increase in abdominal pressure.  After reaching 15 mm, the Veress needle was removed and a 8 mm port was placed via optiview technique in same area.  Inspection of abdominal cavity noted no injury during placement.  One additional 55mm port placed medial to original site.    Extensive adhesions throughout abdomen with pockets of straw colored fluid, exudative buildup.  No obvious mass or other pathology visible.  All visible pockets of fluid suctioned and portion of peritoneum away from bowel along with some of the exudative type buildup stuck to peritoneum was removed out of abdomen and sent to pathology for further analysis.  No bleeding or injuries noted.    Abdomen desufflated and trocars were removed under direct vision. No bleeding was noted. All skin incisions then closed with subcuticular sutures of 4-0 monocryl and dressed with topical skin adhesive. The orogastric  tube was removed and patient extubated.  The patient tolerated the procedure well and was taken to the postanesthesia care unit in stable condition.  All sponge and instrument count correct at end of procedure.

## 2021-04-17 NOTE — Anesthesia Postprocedure Evaluation (Addendum)
Anesthesia Post Note  Patient: Anthony Hess  Procedure(s) Performed: LAPAROSCOPY DIAGNOSTIC (Abdomen)  Patient location: recovered in the OR. Anesthesia Type: General Level of consciousness: awake and alert Pain management: pain level controlled Vital Signs Assessment: post-procedure vital signs reviewed and stable Respiratory status: spontaneous breathing, nonlabored ventilation and respiratory function stable Cardiovascular status: blood pressure returned to baseline and stable Postop Assessment: no apparent nausea or vomiting Anesthetic complications: no   No notable events documented.   Last Vitals:  Vitals:   04/17/21 0835 04/17/21 0948  BP: 94/71 (!) 89/62  Pulse: (!) 55 73  Resp: 18   Temp: 36.8 C   SpO2: 94%     Last Pain:  Vitals:   04/17/21 0835  TempSrc: Oral  PainSc:                  [place] Ouch

## 2021-04-17 NOTE — Discharge Summary (Signed)
Physician Discharge Summary   Patient: Anthony Hess MRN: [id number] DOB: [birth year]  Admit date:     04/08/2021  Discharge date: 04/17/21  Discharge Physician: Lorella Nimrod   PCP: Pcp, No   Recommendations at discharge:  Please follow-up with your general surgeon in a week to obtain your biopsy results. Continue going for your scheduled dialysis. Follow-up with your primary care provider  Discharge Diagnoses: Principal Problem:   Peritonitis associated with peritoneal dialysis [facility]) Active Problems:   ESRD (end stage renal disease) ([place])   Anemia in ESRD (end-stage renal disease) ([place])   COVID-19 virus infection   Hypertension   Hypocalcemia   Hypokalemia   Abdominal pain   Hospital Course: Taken from prior notes.  Xzander Gilham is a 66 y.o. male with a PMH significant for ESRD on PD previously and now HD TThS. They presented from home to the ED on 04/08/2021 with diffuse abdominal pain x 7 days. He had his non-functioning PD catheter removed about a week ago.  On another note, he has been receiving HD through a tunneled cath. In the ED, it was found that they had mild ascites, peritonitis on CT scan. Signs of enteritis also present.  They were treated with IV Abx and supportive care.  ID was also consulted, they are recommending stopping antibiotics and doing a peritoneal biopsy as there is no sufficient ascites for paracentesis. Surgery was consulted and plan is to do a peritoneal biopsy to rule out lymphoma, TB or any other atypical infection due to persistent pain on Thursday morning.  Nephrology was consulted for routine HD, patient is having regular dialysis on TTS.  He was also tested positive for COVID-19, remained asymptomatic.  Per ID note patient needs total of 10 days of isolation, today day 8.  Patient underwent diagnostic laparoscopic evaluation and peritoneal biopsies today with general surgery.  Tolerated the procedure well.  Developed mild hypotension after the  procedure, responded well to IV fluid.  Patient remained stable, had his routine dialysis on 04/16/2021 after his diagnostic laparoscopy.  His abdominal pain improving.  Initial aerobic/anaerobic cultures were without any growth.  Rest of the pathology results and cultures pending.  Patient needs to have a close follow-up with general surgery and his nephrologist can also follow-up for his biopsy and culture results.  Patient will need an ID referral if any of the results warranted.  Patient was also incidentally tested positive for COVID-19, mostly remained asymptomatic and complete the course of remdesivir.  Patient did develop intermittent softer blood pressure.  We made some changes to his home meds, he will discontinue low-dose hydralazine, we decreased the dose of metoprolol, irbesartan was discontinued and Entresto was added.  Patient will need a close follow-up with PCP for further titration and management of his medications.  Patient will continue with the rest of his home medications and follow-up with his providers.  Assessment and Plan: * Peritonitis associated with peritoneal dialysis ([place])- (present on admission) Patient continued to have abdominal pain, seems chronic.  Infectious diseases following and they discontinue antibiotics. Patient underwent diagnostic laparoscopic evaluation with peritoneal biopsies with general surgery earlier today.  Tolerated the procedure well. -Biopsies were sent for pathology and culture to rule out lymphoma/fungal infection/any other atypical infection -Continue with pain management. -Most likely be discharged tomorrow for outpatient follow-up for final recommendations  ESRD (end stage renal disease) ([place])- (present on admission) Patient had her dialysis today. Nephrology is following. -Continue routine dialysis -Continue with Velphoro and calcium carbonate supplement  Anemia in ESRD (end-stage renal disease) ([place])- (present on  admission) Hemoglobin seems stable. -Continue to monitor -Transfuse if below 7  COVID-19 virus infection Patient received remdesivir. Remained on room air. Per ID recommendation inpatient needs to be in isolation for 10 days -Supportive care  Hypertension- (present on admission) Developed mild hypotension after his surgical procedure requiring 1 L of bolus. -Holding home dose of hydralazine, metoprolol and Entresto   Consultants: Infectious disease, nephrology, general surgery Procedures performed: Diagnostic laparoscopy, hemodialysis Disposition: Home Diet recommendation:  Discharge Diet Orders (From admission, onward)     Start     Ordered   04/17/21 0000  Diet - low sodium heart healthy        04/17/21 1313           Cardiac diet  DISCHARGE MEDICATION: Allergies as of 04/17/2021   No Known Allergies      Medication List     STOP taking these medications    calcitRIOL 0.5 MCG capsule Commonly known as: ROCALTROL   hydrALAZINE 10 MG tablet Commonly known as: APRESOLINE   irbesartan 150 MG tablet Commonly known as: AVAPRO       TAKE these medications    acetaminophen 500 MG tablet Commonly known as: TYLENOL Take 2 tablets (1,000 mg total) by mouth every 6 (six) hours as needed for mild pain.   ascorbic acid 500 MG tablet Commonly known as: VITAMIN C Take 1 tablet (500 mg total) by mouth daily. Start taking on: April 18, 2021   aspirin EC 81 MG tablet Take 81 mg by mouth daily.   atorvastatin 40 MG tablet Commonly known as: LIPITOR Take 1 tablet (40 mg total) by mouth daily.   benzonatate 200 MG capsule Commonly known as: TESSALON Take 200 mg by mouth 3 (three) times daily as needed.   calcium carbonate 1250 (500 Ca) MG tablet Commonly known as: OS-CAL - dosed in mg of elemental calcium Take 1 tablet (1,250 mg total) by mouth 2 (two) times daily between meals.   feeding supplement (NEPRO CARB STEADY) Liqd Take 237 mLs by mouth 3  (three) times daily between meals.   magnesium oxide 400 (240 Mg) MG tablet Commonly known as: MAG-OX Take 1 tablet (400 mg total) by mouth daily.   metoprolol succinate 50 MG 24 hr tablet Commonly known as: TOPROL-XL Take 0.5 tablets (25 mg total) by mouth daily. Take with or immediately following a meal. What changed: how much to take   multivitamin Tabs tablet Take 1 tablet by mouth at bedtime.   ondansetron 8 MG disintegrating tablet Commonly known as: ZOFRAN-ODT Take 8 mg by mouth every 8 (eight) hours as needed.   sacubitril-valsartan 24-26 MG Commonly known as: ENTRESTO Take 1 tablet by mouth 2 (two) times daily.   Velphoro 500 MG chewable tablet Generic drug: sucroferric oxyhydroxide Chew 1,000 mg by mouth 3 (three) times daily.   Vitamin D (Ergocalciferol) 1.25 MG (50000 UNIT) Caps capsule Commonly known as: DRISDOL Take 1 capsule (50,000 Units total) by mouth every 7 (seven) days. What changed: when to take this   zinc sulfate 220 (50 Zn) MG capsule Take 1 capsule (220 mg total) by mouth daily. Start taking on: April 18, 2021               Discharge Care Instructions  (From admission, onward)           Start     Ordered   04/17/21 0000  No dressing needed  04/17/21 1313            Follow-up Information     Sakai, Isami, DO Follow up in 2 week(s).   Specialty: Surgery Why: post op diagnostic laparoscopy Contact information: [address] [place] [address] [phone]                 Discharge Exam: Filed Weights   04/14/21 1230 04/16/21 1434 04/16/21 1800  Weight: 54.3 kg 56.3 kg 55 kg   General.     In no acute distress. Pulmonary.  Lungs clear bilaterally, normal respiratory effort. CV.  Regular rate and rhythm, no JVD, rub or murmur. Abdomen.  Soft, nontender, nondistended, BS positive. CNS.  Alert and oriented .  No focal neurologic deficit. Extremities.  No edema, no cyanosis, pulses intact and  symmetrical. Psychiatry.  Judgment and insight appears normal.   Condition at discharge: stable  The results of significant diagnostics from this hospitalization (including imaging, microbiology, ancillary and laboratory) are listed below for reference.   Imaging Studies: CT Abdomen Pelvis W Contrast  Result Date: 04/08/2021 CLINICAL DATA:  66 year old male dialysis patient with left lower quadrant abdominal pain since tube removed last week. On antibiotics. Hypotension. EXAM: CT ABDOMEN AND PELVIS WITH CONTRAST TECHNIQUE: Multidetector CT imaging of the abdomen and pelvis was performed using the standard protocol following bolus administration of intravenous contrast. RADIATION DOSE REDUCTION: This exam was performed according to the departmental dose-optimization program which includes automated exposure control, adjustment of the mA and/or kV according to patient size and/or use of iterative reconstruction technique. CONTRAST:  30mL OMNIPAQUE IOHEXOL 300 MG/ML  SOLN COMPARISON:  CT Abdomen and Pelvis 03/14/2021 and earlier. FINDINGS: Lower chest: Left lower lobe atelectasis seen last month has resolved. Prior sternotomy. No pericardial or pleural effusion. Stable borderline cardiomegaly. Hepatobiliary: Stable liver since last year. Several small benign hepatic hemangiomas, most apparent at the liver dome on series 2, image 9. Questionable mild gallbladder wall thickening, although ascites is present and the gallbladder does not appear distended. No bile duct enlargement. Pancreas: Negative. Spleen: Stable and negative. Adrenals/Urinary Tract: Adrenal glands remain within normal limits. Unchanged appearance of both kidneys since last year, partial renal atrophy and chronic hydronephrosis on the right. No renal contrast excretion on delayed images. Mildly distended urinary bladder. Stomach/Bowel: Peritoneal dialysis catheter has been removed since last month. But there is increased free fluid in the  peritoneal cavity, with simple fluid density (series 2 images 40 and 44. No superimposed pneumoperitoneum. Low-density retained stool throughout redundant distal large bowel. Low-density stool and gas throughout the transverse and descending colon. Fluid in the right colon. No convincing large bowel inflammation. Evidence of normal gas containing retrocecal appendix on coronal image 53. Fluid containing small bowel in the lower abdomen and pelvis. Thick-walled proximal jejunum as on series 2, image 47. No transition point identified. Gas containing decompressed small bowel loops in the ventral abdomen. No definite pneumatosis. Fluid-filled distal stomach.  Similar fluid-filled duodenum. Small mesenteric ventral abdominal hernia again suspected on series 2, image 38. No herniated bowel. Vascular/Lymphatic: Advanced Aortoiliac calcified atherosclerosis. Tortuous abdominal aorta. Major arterial structures in the abdomen and pelvis remain patent despite the atherosclerosis. Portal venous system is patent. No lymphadenopathy identified. Reproductive: Negative. Other: No pelvic free fluid. Musculoskeletal: Prior sternotomy. Moderate thoracolumbar scoliosis. Mild lower lumbar spondylolisthesis with disc and facet degeneration. Previous instrumentation of the proximal right femur. Stable visualized osseous structures. No acute or suspicious osseous lesion identified. IMPRESSION: 1. Peritoneal dialysis catheter has been  removed since last month, but peritoneal Ascites has increased since that time. This is nonspecific but consider peritonitis in the setting of abdominal pain and hypotension. 2. Thick-walled proximal small bowel loops, nonspecific. No transition point or convincing bowel obstruction. Infectious enteritis remains possible. 3. Normal appendix. Retained low-density stool throughout the distal colon. No convincing large bowel inflammation. 4. Otherwise stable CT appearance of the abdomen and pelvis. Advanced  Aortic Atherosclerosis (ICD10-I70.0). Electronically Signed   By: Genevie Ann M.D.   On: 04/08/2021 05:21   PERIPHERAL VASCULAR CATHETERIZATION  Result Date: 03/18/2021 See surgical note for result.  DG Chest Port 1 View  Result Date: 04/08/2021 CLINICAL DATA:  Abdominal pain EXAM: PORTABLE CHEST 1 VIEW COMPARISON:  03/14/2021 FINDINGS: The heart size and mediastinal contours are within normal limits. Both lungs are clear. The visualized skeletal structures are unremarkable. Remote median sternotomy. Right IJ approach central venous catheter tip at the cavoatrial junction. IMPRESSION: No active disease. Electronically Signed   By: Ulyses Jarred M.D.   On: 04/08/2021 03:41   [place] ASCITES (ABDOMEN LIMITED)  Result Date: 04/13/2021 CLINICAL DATA:  Assess for ascites EXAM: LIMITED ABDOMEN ULTRASOUND FOR ASCITES TECHNIQUE: Limited ultrasound survey for ascites was performed in all four abdominal quadrants. COMPARISON:  CT 04/08/2021 FINDINGS: Only trace amount of ascites is visible within the left lower quadrant and mid abdominal region. No sizable fluid pocket identified for paracentesis IMPRESSION: Trace too small amount of ascites is present. Electronically Signed   By: Donavan Foil M.D.   On: 04/13/2021 15:48    Microbiology: Results for orders placed or performed during the hospital encounter of 04/08/21  Culture, blood (routine x 2)     Status: None   Collection Time: 04/08/21  5:45 AM   Specimen: BLOOD  Result Value Ref Range Status   Specimen Description BLOOD LEFT HAND  Final   Special Requests   Final    BOTTLES DRAWN AEROBIC AND ANAEROBIC Blood Culture results may not be optimal due to an excessive volume of blood received in culture bottles   Culture   Final    NO GROWTH 5 DAYS Performed at [facility], [address] [place], [place] [address]    Report Status 04/13/2021 FINAL  Final  Resp Panel by RT-PCR (Flu A&B, Covid) Nasopharyngeal Swab     Status: Abnormal    Collection Time: 04/08/21  5:45 AM   Specimen: Nasopharyngeal Swab; Nasopharyngeal(NP) swabs in vial transport medium  Result Value Ref Range Status   SARS Coronavirus 2 by RT PCR POSITIVE (A) NEGATIVE Final    Comment: (NOTE) SARS-CoV-2 target nucleic acids are DETECTED.  The SARS-CoV-2 RNA is generally detectable in upper respiratory specimens during the acute phase of infection. Positive results are indicative of the presence of the identified virus, but do not rule out bacterial infection or co-infection with other pathogens not detected by the test. Clinical correlation with patient history and other diagnostic information is necessary to determine patient infection status. The expected result is Negative.  Fact Sheet for Patients: EntrepreneurPulse.com.au  Fact Sheet for Healthcare Providers: IncredibleEmployment.be  This test is not yet approved or cleared by the [place] FDA and  has been authorized for detection and/or diagnosis of SARS-CoV-2 by FDA under an Emergency Use Authorization (EUA).  This EUA will remain in effect (meaning this test can be used) for the duration of  the COVID-19 declaration under Section 564(b)(1) of the A ct, 21 U.S.C. section 360bbb-3(b)(1), unless the authorization is terminated  or revoked sooner.     Influenza A by PCR NEGATIVE NEGATIVE Final   Influenza B by PCR NEGATIVE NEGATIVE Final    Comment: (NOTE) The Xpert Xpress SARS-CoV-2/FLU/RSV plus assay is intended as an aid in the diagnosis of influenza from Nasopharyngeal swab specimens and should not be used as a sole basis for treatment. Nasal washings and aspirates are unacceptable for Xpert Xpress SARS-CoV-2/FLU/RSV testing.  Fact Sheet for Patients: EntrepreneurPulse.com.au  Fact Sheet for Healthcare Providers: IncredibleEmployment.be  This test is not yet approved or cleared by the [place] FDA  and has been authorized for detection and/or diagnosis of SARS-CoV-2 by FDA under an Emergency Use Authorization (EUA). This EUA will remain in effect (meaning this test can be used) for the duration of the COVID-19 declaration under Section 564(b)(1) of the Act, 21 U.S.C. section 360bbb-3(b)(1), unless the authorization is terminated or revoked.  Performed at [facility], [address] [place], [place] [address]   Culture, blood (routine x 2)     Status: None   Collection Time: 04/08/21  5:46 AM   Specimen: BLOOD  Result Value Ref Range Status   Specimen Description BLOOD RIGHT HAND  Final   Special Requests   Final    BOTTLES DRAWN AEROBIC AND ANAEROBIC Blood Culture adequate volume   Culture   Final    NO GROWTH 5 DAYS Performed at [facility], [address] [place], [place] [address]    Report Status 04/13/2021 FINAL  Final  MRSA Next Gen by PCR, Nasal     Status: None   Collection Time: 04/08/21  3:16 PM   Specimen: Nasal Mucosa; Nasal Swab  Result Value Ref Range Status   MRSA by PCR Next Gen NOT DETECTED NOT DETECTED Final    Comment: (NOTE) The GeneXpert MRSA Assay (FDA approved for NASAL specimens only), is one component of a comprehensive MRSA colonization surveillance program. It is not intended to diagnose MRSA infection nor to guide or monitor treatment for MRSA infections. Test performance is not FDA approved in patients less than 81 years old. Performed at [facility], [address] [place], [place] [address]   Aerobic/Anaerobic Culture w Gram Stain (surgical/deep wound)     Status: None (Preliminary result)   Collection Time: 04/16/21  9:58 AM   Specimen: PATH Cytology Peritoneal fluid; Body Fluid  Result Value Ref Range Status   Specimen Description   Final    PERITONEAL Performed at [facility], [address] [place], [place] [address]    Special Requests   Final    NONE Performed at [facility], [address] [place], [place] [address]    Gram Stain   Final    ABUNDANT WBC PRESENT, PREDOMINANTLY MONONUCLEAR NO ORGANISMS SEEN    Culture   Final    NO GROWTH < 24 HOURS Performed at [facility], [place] [address], [place], [place] [address]    Report Status PENDING  Incomplete  Aerobic/Anaerobic Culture w Gram Stain (surgical/deep wound)     Status: None (Preliminary result)   Collection Time: 04/16/21 11:28 AM   Specimen: Tissue  Result Value Ref Range Status   Specimen Description   Final    TISSUE Performed at [facility], [address] [place], [place] [address]    Special Requests   Final    BIOPSY PERITONEAL Performed at [facility], [address], [place], [place] [address]    Gram Stain  NO WBC SEEN NO ORGANISMS SEEN   Final   Culture   Final    NO GROWTH < 24 HOURS Performed at [facility], [place] [address], [place], [place] [address]    Report Status PENDING  Incomplete    Labs: CBC: Recent Labs  Lab 04/15/21 0329  WBC 6.0  HGB 8.5*  HCT 29.3*  MCV 89.1  PLT 203   Basic Metabolic Panel: Recent Labs  Lab 04/11/21 0821 04/14/21 0808 04/15/21 0329 04/16/21 0800  NA 142 140  --  142  K 4.4 4.7  --  4.2  CL 105 104  --  105  CO2 22 23  --  24  GLUCOSE 84 82  --  84  BUN 54* 64*  --  63*  CREATININE 7.58* 8.45* 5.71* 7.84*  CALCIUM 6.3* 6.8*  --  7.3*  PHOS 9.1* 7.5*  --  6.9*   Liver Function Tests: Recent Labs  Lab 04/11/21 0821 04/14/21 0808 04/16/21 0800  ALBUMIN 2.1* 2.0* 2.3*   CBG: No results for input(s): GLUCAP in the last 168 hours.  Discharge time spent: greater than 30 minutes.  This record has been created using Systems analyst. Errors have been sought and corrected,but may not always be located. Such creation errors do not reflect on the standard of care.   Signed: Lorella Nimrod, MD [facility] 04/17/2021

## 2021-04-17 NOTE — Plan of Care (Signed)

## 2021-04-17 NOTE — Progress Notes (Signed)
[facility]  ROUNDING NOTE   Subjective:   Anthony Hess is a 66 year old male with past medical conditions including bladder cancer, CAD, hypertension, and end-stage renal disease on hemodialysis.  Patient presents to the emergency department with complaints of abdominal pain.  Patient has been admitted for Hypocalcemia [E83.51] Hypokalemia [E87.6] SBP (spontaneous bacterial peritonitis) ([place]) [K65.2] Peritonitis associated with peritoneal dialysis ([place]) [T85.71XA] Abdominal pain, unspecified abdominal location [R10.9]  Patient is known to our practice and receives outpatient dialysis treatments at [facility] on a TTS schedule, supervised by Dr. Holley Raring.    Patient seen sitting at side of bed, partially dressed Reports mild pain from procedure site yesterday Denies lower abdominal pain Tolerating meals  Received dialysis yesterday, tolerated well   Objective:  Vital signs in last 24 hours:  Temp:  [97.7 F (36.5 C)-98.3 F (36.8 C)] 98.2 F (36.8 C) (02/24 0835) Pulse Rate:  [50-106] 73 (02/24 0948) Resp:  [9-26] 18 (02/24 0835) BP: (81-134)/(57-105) 89/62 (02/24 0948) SpO2:  [80 %-100 %] 94 % (02/24 0835) Weight:  [55 kg-56.3 kg] 55 kg (02/23 1800)  Weight change:  Filed Weights   04/14/21 1230 04/16/21 1434 04/16/21 1800  Weight: 54.3 kg 56.3 kg 55 kg    Intake/Output: I/O last 3 completed shifts: In: -  Out: 1 [Blood:1]   Intake/Output this shift:  No intake/output data recorded.  Physical Exam: General: NAD, resting in bed  Head: Normocephalic, atraumatic. Moist oral mucosal membranes  Eyes: Anicteric  Lungs:  Clear to auscultation, normal effort  Heart: Regular rate and rhythm  Abdomen:  Soft, nontender, mild distention  Extremities: No peripheral edema.  Neurologic: Nonfocal, moving all four extremities  Skin: No lesions  Access: Right IJ PermCath    Basic Metabolic Panel: Recent Labs  Lab 04/11/21 0821 04/14/21 0808  04/15/21 0329 04/16/21 0800  NA 142 140  --  142  K 4.4 4.7  --  4.2  CL 105 104  --  105  CO2 22 23  --  24  GLUCOSE 84 82  --  84  BUN 54* 64*  --  63*  CREATININE 7.58* 8.45* 5.71* 7.84*  CALCIUM 6.3* 6.8*  --  7.3*  PHOS 9.1* 7.5*  --  6.9*     Liver Function Tests: Recent Labs  Lab 04/11/21 0821 04/14/21 0808 04/16/21 0800  ALBUMIN 2.1* 2.0* 2.3*    No results for input(s): LIPASE, AMYLASE in the last 168 hours.  No results for input(s): AMMONIA in the last 168 hours.  CBC: Recent Labs  Lab 04/15/21 0329  WBC 6.0  HGB 8.5*  HCT 29.3*  MCV 89.1  PLT 219     Cardiac Enzymes: No results for input(s): CKTOTAL, CKMB, CKMBINDEX, TROPONINI in the last 168 hours.  BNP: Invalid input(s): POCBNP  CBG: No results for input(s): GLUCAP in the last 168 hours.  Microbiology: Results for orders placed or performed during the hospital encounter of 04/08/21  Culture, blood (routine x 2)     Status: None   Collection Time: 04/08/21  5:45 AM   Specimen: BLOOD  Result Value Ref Range Status   Specimen Description BLOOD LEFT HAND  Final   Special Requests   Final    BOTTLES DRAWN AEROBIC AND ANAEROBIC Blood Culture results may not be optimal due to an excessive volume of blood received in culture bottles   Culture   Final    NO GROWTH 5 DAYS Performed at [facility], [place],  [place], [place] [address]    Report Status 04/13/2021 FINAL  Final  Resp Panel by RT-PCR (Flu A&B, Covid) Nasopharyngeal Swab     Status: Abnormal   Collection Time: 04/08/21  5:45 AM   Specimen: Nasopharyngeal Swab; Nasopharyngeal(NP) swabs in vial transport medium  Result Value Ref Range Status   SARS Coronavirus 2 by RT PCR POSITIVE (A) NEGATIVE Final    Comment: (NOTE) SARS-CoV-2 target nucleic acids are DETECTED.  The SARS-CoV-2 RNA is generally detectable in upper respiratory specimens during the acute phase of infection. Positive results are indicative of the  presence of the identified virus, but do not rule out bacterial infection or co-infection with other pathogens not detected by the test. Clinical correlation with patient history and other diagnostic information is necessary to determine patient infection status. The expected result is Negative.  Fact Sheet for Patients: EntrepreneurPulse.com.au  Fact Sheet for Healthcare Providers: IncredibleEmployment.be  This test is not yet approved or cleared by the [place] FDA and  has been authorized for detection and/or diagnosis of SARS-CoV-2 by FDA under an Emergency Use Authorization (EUA).  This EUA will remain in effect (meaning this test can be used) for the duration of  the COVID-19 declaration under Section 564(b)(1) of the A ct, 21 U.S.C. section 360bbb-3(b)(1), unless the authorization is terminated or revoked sooner.     Influenza A by PCR NEGATIVE NEGATIVE Final   Influenza B by PCR NEGATIVE NEGATIVE Final    Comment: (NOTE) The Xpert Xpress SARS-CoV-2/FLU/RSV plus assay is intended as an aid in the diagnosis of influenza from Nasopharyngeal swab specimens and should not be used as a sole basis for treatment. Nasal washings and aspirates are unacceptable for Xpert Xpress SARS-CoV-2/FLU/RSV testing.  Fact Sheet for Patients: EntrepreneurPulse.com.au  Fact Sheet for Healthcare Providers: IncredibleEmployment.be  This test is not yet approved or cleared by the [place] FDA and has been authorized for detection and/or diagnosis of SARS-CoV-2 by FDA under an Emergency Use Authorization (EUA). This EUA will remain in effect (meaning this test can be used) for the duration of the COVID-19 declaration under Section 564(b)(1) of the Act, 21 U.S.C. section 360bbb-3(b)(1), unless the authorization is terminated or revoked.  Performed at [facility], [address] [place], [place]  [address]   Culture, blood (routine x 2)     Status: None   Collection Time: 04/08/21  5:46 AM   Specimen: BLOOD  Result Value Ref Range Status   Specimen Description BLOOD RIGHT HAND  Final   Special Requests   Final    BOTTLES DRAWN AEROBIC AND ANAEROBIC Blood Culture adequate volume   Culture   Final    NO GROWTH 5 DAYS Performed at [facility], [address] [place], [place] [address]    Report Status 04/13/2021 FINAL  Final  MRSA Next Gen by PCR, Nasal     Status: None   Collection Time: 04/08/21  3:16 PM   Specimen: Nasal Mucosa; Nasal Swab  Result Value Ref Range Status   MRSA by PCR Next Gen NOT DETECTED NOT DETECTED Final    Comment: (NOTE) The GeneXpert MRSA Assay (FDA approved for NASAL specimens only), is one component of a comprehensive MRSA colonization surveillance program. It is not intended to diagnose MRSA infection nor to guide or monitor treatment for MRSA infections. Test performance is not FDA approved in patients less than 38 years old. Performed at [facility], [address], [place], [place] [address]   Aerobic/Anaerobic  Culture w Gram Stain (surgical/deep wound)     Status: None (Preliminary result)   Collection Time: 04/16/21  9:58 AM   Specimen: PATH Cytology Peritoneal fluid; Body Fluid  Result Value Ref Range Status   Specimen Description   Final    PERITONEAL Performed at [facility], [address] [place], [place] [address]    Special Requests   Final    NONE Performed at [facility], [address] [place], [place] [address]    Gram Stain   Final    ABUNDANT WBC PRESENT, PREDOMINANTLY MONONUCLEAR NO ORGANISMS SEEN    Culture   Final    NO GROWTH < 24 HOURS Performed at [facility], [place] [address], [place], [place] [address]    Report Status PENDING  Incomplete  Aerobic/Anaerobic Culture w Gram Stain (surgical/deep wound)     Status: None (Preliminary result)   Collection Time:  04/16/21 11:28 AM   Specimen: Tissue  Result Value Ref Range Status   Specimen Description   Final    TISSUE Performed at [facility], [address] [place], [place] [address]    Special Requests   Final    BIOPSY PERITONEAL Performed at [facility], [place], [place] [address]    Gram Stain NO WBC SEEN NO ORGANISMS SEEN   Final   Culture   Final    NO GROWTH < 24 HOURS Performed at [facility], [place] [address], [place], [place] [address]    Report Status PENDING  Incomplete    Coagulation Studies: No results for input(s): LABPROT, INR in the last 72 hours.  Urinalysis: No results for input(s): COLORURINE, LABSPEC, PHURINE, GLUCOSEU, HGBUR, BILIRUBINUR, KETONESUR, PROTEINUR, UROBILINOGEN, NITRITE, LEUKOCYTESUR in the last 72 hours.  Invalid input(s): APPERANCEUR     Imaging: No results found.   Medications:    sodium chloride      vitamin C  500 mg Oral Daily   aspirin EC  81 mg Oral Daily   atorvastatin  40 mg Oral Daily   calcitRIOL  0.5 mcg Oral Daily   calcium carbonate  1,250 mg Oral BID WC   Chlorhexidine Gluconate Cloth  6 each Topical Daily   epoetin (EPOGEN/PROCRIT) injection  10,000 Units Subcutaneous Q T,Th,Sa-HD   feeding supplement (NEPRO CARB STEADY)  237 mL Oral TID BM   heparin  5,000 Units Subcutaneous Q8H   hydrALAZINE  10 mg Oral Q8H   magnesium oxide  400 mg Oral Daily   metoprolol succinate  50 mg Oral Daily   multivitamin  1 tablet Oral QHS   sacubitril-valsartan  1 tablet Oral BID   sucroferric oxyhydroxide  1,000 mg Oral TID WC   zinc sulfate  220 mg Oral Daily   sodium chloride, acetaminophen, albuterol, guaiFENesin-dextromethorphan, ondansetron **OR** ondansetron (ZOFRAN) IV, oxyCODONE  Assessment/ Plan:  Anthony Hess is a 66 y.o.  male with past medical conditions including bladder cancer, CAD, hypertension, and end-stage renal disease on hemodialysis.  Patient presents to the  emergency department with complaints of abdominal pain.  Patient has been admitted for Hypocalcemia [E83.51] Hypokalemia [E87.6] SBP (spontaneous bacterial peritonitis) ([place]) [K65.2] Peritonitis associated with peritoneal dialysis ([place]) [T85.71XA] Abdominal pain, unspecified abdominal location [R10.9]  [facility]/TTS/ Rt Permcath  End-stage renal disease on hemodialysis.  Will maintain outpatient schedule if possible.  Received dialysis yesterday, no UF.  Tolerated well.  Next treatment scheduled for Saturday.  2. Anemia of chronic  kidney disease Normocytic  Lab Results  Component Value Date   HGB 8.5 (L) 04/15/2021    Mircera prescribed outpatient EPO with dialysis treatments  3. Secondary Hyperparathyroidism:  Lab Results  Component Value Date   PTH 159 (H) 12/27/2019   CALCIUM 7.3 (L) 04/16/2021   CAION 0.74 (LL) 01/20/2021   PHOS 6.9 (H) 04/16/2021  Will continue calcium 3 baths during dialysis.  Continue calcium carbonate and calcitriol  4.  Hypertension with chronic kidney disease.  Outpatient regimen includes hydralazine, irbesartan, and metoprolol. Irbesartan held at this time. Now on Entresto.     BP appears soft this morning, 92/67  5.  Peritonitis associated with peritoneal dialysis. Continue vancomycin and Rocephin. Laparoscopic peritoneal biopsy completed on 04/16/2021.  Currently awaiting results . 6.  COVID-19 positive Positive finding on 04/08/2021.  S/P remdesivir and continue  supportive care therapies.  Isolation resumed due to required 10-day quarantine.   LOS: 9 Anthony Hess 2/24/20239:58 AM

## 2021-04-17 NOTE — TOC Transition Note (Signed)
Transition of Care [facility]) - CM/SW Discharge Note   Patient Details  Name: Jerard Bays MRN: [id number] Date of Birth: 1956/02/03  [facility] of [facility]) CM/SW Contact:  Beverly Sessions, RN Phone Number: 04/17/2021, 12:40 PM   Clinical Narrative:     Cab voucher placed on chart for when bedside RN is ready for discharge Elvera Bicker dialysis liaison notified of discharge         Patient Goals and CMS Choice        Discharge Placement                       Discharge Plan and Services                                     Social Determinants of Health (SDOH) Interventions     Readmission Risk Interventions Readmission Risk Prevention Plan 04/09/2021 03/16/2021 01/09/2020  Transportation Screening Complete Complete Complete  PCP or Specialist Appt within 3-5 Days - - Complete  Social Work Consult for [place] Planning/Counseling - Complete Complete  Palliative Care Screening Not Applicable Not Applicable Not Applicable  Medication Review Press photographer) Complete Complete Complete  Some recent data might be hidden

## 2021-04-17 NOTE — Progress Notes (Signed)
Pt getting discharged today Nephrology to follow all labs including biopsy and cultures and if positive to contact [facility] -ID schedule available in Amion and can be contcted by secure chat for further management as I will be away until 3/21 Discussed with Dr.LAteef

## 2021-04-18 LAB — ACID FAST SMEAR (AFB, MYCOBACTERIA): Acid Fast Smear: NEGATIVE

## 2021-04-21 LAB — AEROBIC/ANAEROBIC CULTURE W GRAM STAIN (SURGICAL/DEEP WOUND)
Culture: NO GROWTH
Culture: NO GROWTH
Gram Stain: NONE SEEN

## 2021-05-08 LAB — CULTURE, FUNGUS WITHOUT SMEAR

## 2021-05-18 LAB — FUNGUS CULTURE WITH STAIN

## 2021-05-18 LAB — FUNGAL ORGANISM REFLEX

## 2021-05-18 LAB — FUNGUS CULTURE RESULT

## 2021-05-30 LAB — ACID FAST CULTURE WITH REFLEXED SENSITIVITIES (MYCOBACTERIA): Acid Fast Culture: NEGATIVE

## 2021-05-31 LAB — ACID FAST CULTURE WITH REFLEXED SENSITIVITIES (MYCOBACTERIA): Acid Fast Culture: NEGATIVE

## 2021-06-23 DIAGNOSIS — D510 Vitamin B12 deficiency anemia due to intrinsic factor deficiency: Secondary | ICD-10-CM | POA: Insufficient documentation

## 2021-06-24 ENCOUNTER — Telehealth (INDEPENDENT_AMBULATORY_CARE_PROVIDER_SITE_OTHER): Payer: Self-pay | Admitting: Nurse Practitioner

## 2021-06-24 NOTE — Telephone Encounter (Signed)
I received a referral for this pt - we have put in and taken out perm caths but he is not our patient (last one inserted 02/2021 by Dr.Dew). The referral cam over from [facility]. Would you like ue v-map and ue art dup or just consult. I only have the referral for the consult for ESRD. Please advise.  ?

## 2021-07-14 ENCOUNTER — Other Ambulatory Visit (INDEPENDENT_AMBULATORY_CARE_PROVIDER_SITE_OTHER): Payer: Self-pay | Admitting: Nurse Practitioner

## 2021-07-14 DIAGNOSIS — N186 End stage renal disease: Secondary | ICD-10-CM

## 2021-07-15 ENCOUNTER — Ambulatory Visit (INDEPENDENT_AMBULATORY_CARE_PROVIDER_SITE_OTHER): Payer: Medicare Other | Admitting: Nurse Practitioner

## 2021-07-15 ENCOUNTER — Ambulatory Visit (INDEPENDENT_AMBULATORY_CARE_PROVIDER_SITE_OTHER): Payer: Medicare Other

## 2021-07-15 ENCOUNTER — Encounter (INDEPENDENT_AMBULATORY_CARE_PROVIDER_SITE_OTHER): Payer: Self-pay | Admitting: Nurse Practitioner

## 2021-07-15 VITALS — BP 144/87 | HR 86 | Resp 16 | Ht 63.0 in | Wt 122.0 lb

## 2021-07-15 DIAGNOSIS — I10 Essential (primary) hypertension: Secondary | ICD-10-CM

## 2021-07-15 DIAGNOSIS — N186 End stage renal disease: Secondary | ICD-10-CM | POA: Diagnosis not present

## 2021-07-17 ENCOUNTER — Telehealth (INDEPENDENT_AMBULATORY_CARE_PROVIDER_SITE_OTHER): Payer: Self-pay

## 2021-07-17 NOTE — Telephone Encounter (Signed)
Spoke with the patient's spouse and he is scheduled with Dr. Lucky Cowboy for a left brachial axillary graft on 07/721 at [facility]. Pre-op phone call is on 07/24/21 between 8-1 pm. Pre-surgical instructions were discussed and will be mailed.

## 2021-07-20 NOTE — H&P (View-Only) (Signed)
Subjective:    Patient ID: Anthony Hess, male    DOB: 1955/11/24, 66 y.o.   MRN: [id number] Chief Complaint  Patient presents with   New Patient (Initial Visit)    Ref consult vein mapping      The patient is seen for evaluation of dialysis access.  The patient has previously been on peritoneal dialysis Current access is via a catheter which is currently functioning without issue..  The patient is right-handed.  No recent shortening of the patient's walking distance or new symptoms consistent with claudication.  No history of rest pain symptoms. No new ulcers or wounds of the lower extremities have occurred.  The patient denies amaurosis fugax or recent TIA symptoms. There are no recent neurological changes noted. There is no history of DVT, PE or superficial thrombophlebitis. No recent episodes of angina or shortness of breath documented.    Today noninvasive studies reveal adequate access for a left brachial axillary AV graft.   Review of Systems  All other systems reviewed and are negative.     Objective:   Physical Exam Vitals reviewed.  HENT:     Head: Normocephalic.  Cardiovascular:     Rate and Rhythm: Normal rate.     Pulses:          Radial pulses are 1+ on the right side and 1+ on the left side.  Pulmonary:     Effort: Pulmonary effort is normal.  Skin:    General: Skin is warm and dry.  Neurological:     Mental Status: He is alert and oriented to person, place, and time.  Psychiatric:        Mood and Affect: Mood normal.        Behavior: Behavior normal.        Thought Content: Thought content normal.        Judgment: Judgment normal.    BP (!) 144/87 (BP Location: Right Arm)   Pulse 86   Resp 16   Ht '5\' 3"'$  (1.6 m)   Wt 122 lb (55.3 kg)   BMI 21.61 kg/m   Past Medical History:  Diagnosis Date   Bladder tumor    Cancer [facility])    Bladder   Chronic kidney disease    renal insufficiency   Coronary artery disease    History of kidney stones     Hypertension    Myocardial infarction ([place])    Sepsis ([place]) 03/14/2021   Wears glasses     Social History   Socioeconomic History   Marital status: Married    Spouse name: Not on file   Number of children: Not on file   Years of education: Not on file   Highest education level: Not on file  Occupational History   Not on file  Tobacco Use   Smoking status: Former    Packs/day: 0.25    Types: Cigarettes    Quit date: 02/19/2019    Years since quitting: 2.4   Smokeless tobacco: Never  Vaping Use   Vaping Use: Never used  Substance and Sexual Activity   Alcohol use: Never   Drug use: Never   Sexual activity: Yes  Other Topics Concern   Not on file  Social History Narrative   Not on file   Social Determinants of Health   Financial Resource Strain: Not on file  Food Insecurity: Not on file  Transportation Needs: Not on file  Physical Activity: Not on file  Stress: Not on file  Social Connections: Not on file  Intimate Partner Violence: Not on file    Past Surgical History:  Procedure Laterality Date   CAPD INSERTION N/A 12/31/2019   Procedure: LAPAROSCOPIC INSERTION CONTINUOUS AMBULATORY PERITONEAL DIALYSIS  (CAPD) CATHETER;  Surgeon: Jules Husbands, MD;  Location: [facility];  Service: General;  Laterality: N/A;   CAPD REMOVAL N/A 04/10/2020   Procedure: LAPAROSCOPIC REVISION OF CONTINUOUS AMBULATORY PERITONEAL DIALYSIS  (CAPD) CATHETER;  Surgeon: Jules Husbands, MD;  Location: [facility];  Service: General;  Laterality: N/A;   CORONARY ARTERY BYPASS GRAFT N/A 12/27/2018   Procedure: CORONARY ARTERY BYPASS GRAFTING (CABG) X 4 ON PUMP USING RIGHT & LEFT INTERNAL MAMMARY ARTERY LEFT RADIAL ARTERY ENDOSCOPICALLY HARVESTED;  Surgeon: [name], MD;  Location: [place];  Service: Open Heart Surgery;  Laterality: N/A;   CYSTOSCOPY W/ RETROGRADES Bilateral 05/15/2019   Procedure: CYSTOSCOPY WITH RETROGRADE PYELOGRAM;  Surgeon: Abbie Sons, MD;  Location: [facility];  Service:  Urology;  Laterality: Bilateral;   CYSTOSCOPY WITH BIOPSY N/A 05/15/2019   Procedure: CYSTOSCOPY WITH bladder BIOPSY;  Surgeon: Abbie Sons, MD;  Location: [facility];  Service: Urology;  Laterality: N/A;   DIALYSIS/PERMA CATHETER INSERTION N/A 12/28/2019   Procedure: DIALYSIS/PERMA CATHETER INSERTION;  Surgeon: Algernon Huxley, MD;  Location: [place] [facility];  Service: Cardiovascular;  Laterality: N/A;   DIALYSIS/PERMA CATHETER INSERTION N/A 03/18/2021   Procedure: DIALYSIS/PERMA CATHETER INSERTION;  Surgeon: Algernon Huxley, MD;  Location: [place] [facility];  Service: Cardiovascular;  Laterality: N/A;   DIALYSIS/PERMA CATHETER REMOVAL N/A 06/02/2020   Procedure: DIALYSIS/PERMA CATHETER REMOVAL;  Surgeon: Algernon Huxley, MD;  Location: [place] [facility];  Service: Cardiovascular;  Laterality: N/A;   EXCHANGE OF A DIALYSIS CATHETER Right 04/10/2020   Procedure: EXCHANGE OF A DIALYSIS CATHETER;  Surgeon: Jules Husbands, MD;  Location: [facility];  Service: General;  Laterality: Right;   [place]  01/20/2021   Procedure: HERNIA REPAIR INCISIONAL;  Surgeon: Olean Ree, MD;  Location: [facility];  Service: General;;   IR IMAGE GUIDED DRAINAGE PERCUT CATH  PERITONEAL RETROPERIT  04/07/2020   LAPAROSCOPY N/A 04/16/2021   Procedure: LAPAROSCOPY DIAGNOSTIC;  Surgeon: Benjamine Sprague, DO;  Location: [facility];  Service: General;  Laterality: N/A;   LEFT HEART CATH AND CORONARY ANGIOGRAPHY Left 12/20/2018   Procedure: LEFT HEART CATH AND CORONARY ANGIOGRAPHY;  Surgeon: Isaias Cowman, MD;  Location: [place] [facility];  Service: Cardiovascular;  Laterality: Left;   RADIAL ARTERY HARVEST Left 12/27/2018   Procedure: ENDOSCOPIC RADIAL ARTERY HARVEST;  Surgeon: [name], MD;  Location: [place];  Service: Open Heart Surgery;  Laterality: Left;   REMOVAL OF A DIALYSIS CATHETER Left 03/20/2021   Procedure: REMOVAL OF A PD CATHETER;  Surgeon: Algernon Huxley, MD;  Location: [facility];   Service: Vascular;  Laterality: Left;   TEE WITHOUT CARDIOVERSION N/A 12/27/2018   Procedure: TRANSESOPHAGEAL ECHOCARDIOGRAM (TEE);  Surgeon: [name], MD;  Location: [place];  Service: Open Heart Surgery;  Laterality: N/A;   TUMOR REMOVAL  2019   Bladder    Family History  Family history unknown: Yes    No Known Allergies     Latest Ref Rng & Units 04/15/2021    3:29 AM 04/09/2021    3:52 AM 04/07/2021   10:58 PM  CBC  WBC 4.0 - 10.5 K/uL 6.0   6.3   5.1    Hemoglobin 13.0 - 17.0 g/dL 8.5  8.7   8.7    Hematocrit 39.0 - 52.0 % 29.3   29.7   29.8    Platelets 150 - 400 K/uL 219   183   177        CMP     Component Value Date/Time   NA 142 04/16/2021 0800   NA 143 01/04/2020 1542   K 4.2 04/16/2021 0800   CL 105 04/16/2021 0800   CO2 24 04/16/2021 0800   GLUCOSE 84 04/16/2021 0800   BUN 63 (H) 04/16/2021 0800   BUN 52 (H) 01/04/2020 1542   CREATININE 7.84 (H) 04/16/2021 0800   CREATININE 4.62 (H) 01/10/2019 1351   CALCIUM 7.3 (L) 04/16/2021 0800   PROT 4.8 (L) 04/09/2021 0352   PROT 5.7 (L) 01/04/2020 1542   ALBUMIN 2.3 (L) 04/16/2021 0800   ALBUMIN 3.5 (L) 01/04/2020 1542   AST 31 04/09/2021 0352   ALT 47 (H) 04/09/2021 0352   ALKPHOS 49 04/09/2021 0352   BILITOT 0.5 04/09/2021 0352   BILITOT <0.2 01/04/2020 1542   GFRNONAA 7 (L) 04/16/2021 0800   GFRAA 15 (L) 01/04/2020 1542     No results found.     Assessment & Plan:   1. ESRD (end stage renal disease) ([place]) Recommend:  At this time the patient does not have appropriate extremity access for dialysis  Patient should have a left brachial axillary AV graft created.  The risks, benefits and alternative therapies were reviewed in detail with the patient.  All questions were answered.  The patient agrees to proceed with surgery.   The patient will follow up with me in the office after the surgery.   2. Primary hypertension Continue antihypertensive medications as already ordered, these  medications have been reviewed and there are no changes at this time.    Current Outpatient Medications on File Prior to Visit  Medication Sig Dispense Refill   acetaminophen (TYLENOL) 500 MG tablet Take 2 tablets (1,000 mg total) by mouth every 6 (six) hours as needed for mild pain.     ascorbic acid (VITAMIN C) 500 MG tablet Take 1 tablet (500 mg total) by mouth daily. 90 tablet 0   aspirin EC 81 MG tablet Take 81 mg by mouth daily.     atorvastatin (LIPITOR) 40 MG tablet Take 1 tablet (40 mg total) by mouth daily. 30 tablet 1   benzonatate (TESSALON) 200 MG capsule Take 200 mg by mouth 3 (three) times daily as needed.     calcium carbonate (OS-CAL - DOSED IN MG OF ELEMENTAL CALCIUM) 1250 (500 Ca) MG tablet Take 1 tablet (1,250 mg total) by mouth 2 (two) times daily between meals. 60 tablet 1   magnesium oxide (MAG-OX) 400 (240 Mg) MG tablet Take 1 tablet (400 mg total) by mouth daily. 30 tablet 1   metoprolol succinate (TOPROL-XL) 50 MG 24 hr tablet Take 0.5 tablets (25 mg total) by mouth daily. Take with or immediately following a meal. 30 tablet 1   multivitamin (RENA-VIT) TABS tablet Take 1 tablet by mouth at bedtime. 30 tablet 1   Nutritional Supplements (FEEDING SUPPLEMENT, NEPRO CARB STEADY,) LIQD Take 237 mLs by mouth 3 (three) times daily between meals.  0   ondansetron (ZOFRAN-ODT) 8 MG disintegrating tablet Take 8 mg by mouth every 8 (eight) hours as needed.     sacubitril-valsartan (ENTRESTO) 24-26 MG Take 1 tablet by mouth 2 (two) times daily. 60 tablet 1   VELPHORO 500 MG chewable tablet Chew 1,000 mg by  mouth 3 (three) times daily.     Vitamin D, Ergocalciferol, (DRISDOL) 1.25 MG (50000 UNIT) CAPS capsule Take 1 capsule (50,000 Units total) by mouth every 7 (seven) days. (Patient taking differently: Take 50,000 Units by mouth every Saturday.) 5 capsule 1   zinc sulfate 220 (50 Zn) MG capsule Take 1 capsule (220 mg total) by mouth daily. 90 capsule 0   No current  facility-administered medications on file prior to visit.    There are no Patient Instructions on file for this visit. No follow-ups on file.   Kris Hartmann, NP

## 2021-07-20 NOTE — Progress Notes (Signed)
Subjective:    Patient ID: Anthony Hess, male    DOB: 1956/01/02, 66 y.o.   MRN: [id number] Chief Complaint  Patient presents with   New Patient (Initial Visit)    Ref consult vein mapping      The patient is seen for evaluation of dialysis access.  The patient has previously been on peritoneal dialysis Current access is via a catheter which is currently functioning without issue..  The patient is right-handed.  No recent shortening of the patient's walking distance or new symptoms consistent with claudication.  No history of rest pain symptoms. No new ulcers or wounds of the lower extremities have occurred.  The patient denies amaurosis fugax or recent TIA symptoms. There are no recent neurological changes noted. There is no history of DVT, PE or superficial thrombophlebitis. No recent episodes of angina or shortness of breath documented.    Today noninvasive studies reveal adequate access for a left brachial axillary AV graft.   Review of Systems  All other systems reviewed and are negative.     Objective:   Physical Exam Vitals reviewed.  HENT:     Head: Normocephalic.  Cardiovascular:     Rate and Rhythm: Normal rate.     Pulses:          Radial pulses are 1+ on the right side and 1+ on the left side.  Pulmonary:     Effort: Pulmonary effort is normal.  Skin:    General: Skin is warm and dry.  Neurological:     Mental Status: He is alert and oriented to person, place, and time.  Psychiatric:        Mood and Affect: Mood normal.        Behavior: Behavior normal.        Thought Content: Thought content normal.        Judgment: Judgment normal.    BP (!) 144/87 (BP Location: Right Arm)   Pulse 86   Resp 16   Ht '5\' 3"'$  (1.6 m)   Wt 122 lb (55.3 kg)   BMI 21.61 kg/m   Past Medical History:  Diagnosis Date   Bladder tumor    Cancer [facility])    Bladder   Chronic kidney disease    renal insufficiency   Coronary artery disease    History of kidney stones     Hypertension    Myocardial infarction ([place])    Sepsis ([place]) 03/14/2021   Wears glasses     Social History   Socioeconomic History   Marital status: Married    Spouse name: Not on file   Number of children: Not on file   Years of education: Not on file   Highest education level: Not on file  Occupational History   Not on file  Tobacco Use   Smoking status: Former    Packs/day: 0.25    Types: Cigarettes    Quit date: 02/19/2019    Years since quitting: 2.4   Smokeless tobacco: Never  Vaping Use   Vaping Use: Never used  Substance and Sexual Activity   Alcohol use: Never   Drug use: Never   Sexual activity: Yes  Other Topics Concern   Not on file  Social History Narrative   Not on file   Social Determinants of Health   Financial Resource Strain: Not on file  Food Insecurity: Not on file  Transportation Needs: Not on file  Physical Activity: Not on file  Stress: Not on file  Social Connections: Not on file  Intimate Partner Violence: Not on file    Past Surgical History:  Procedure Laterality Date   CAPD INSERTION N/A 12/31/2019   Procedure: LAPAROSCOPIC INSERTION CONTINUOUS AMBULATORY PERITONEAL DIALYSIS  (CAPD) CATHETER;  Surgeon: Jules Husbands, MD;  Location: [facility];  Service: General;  Laterality: N/A;   CAPD REMOVAL N/A 04/10/2020   Procedure: LAPAROSCOPIC REVISION OF CONTINUOUS AMBULATORY PERITONEAL DIALYSIS  (CAPD) CATHETER;  Surgeon: Jules Husbands, MD;  Location: [facility];  Service: General;  Laterality: N/A;   CORONARY ARTERY BYPASS GRAFT N/A 12/27/2018   Procedure: CORONARY ARTERY BYPASS GRAFTING (CABG) X 4 ON PUMP USING RIGHT & LEFT INTERNAL MAMMARY ARTERY LEFT RADIAL ARTERY ENDOSCOPICALLY HARVESTED;  Surgeon: [name], MD;  Location: [place];  Service: Open Heart Surgery;  Laterality: N/A;   CYSTOSCOPY W/ RETROGRADES Bilateral 05/15/2019   Procedure: CYSTOSCOPY WITH RETROGRADE PYELOGRAM;  Surgeon: Abbie Sons, MD;  Location: [facility];  Service:  Urology;  Laterality: Bilateral;   CYSTOSCOPY WITH BIOPSY N/A 05/15/2019   Procedure: CYSTOSCOPY WITH bladder BIOPSY;  Surgeon: Abbie Sons, MD;  Location: [facility];  Service: Urology;  Laterality: N/A;   DIALYSIS/PERMA CATHETER INSERTION N/A 12/28/2019   Procedure: DIALYSIS/PERMA CATHETER INSERTION;  Surgeon: Algernon Huxley, MD;  Location: [place] [facility];  Service: Cardiovascular;  Laterality: N/A;   DIALYSIS/PERMA CATHETER INSERTION N/A 03/18/2021   Procedure: DIALYSIS/PERMA CATHETER INSERTION;  Surgeon: Algernon Huxley, MD;  Location: [place] [facility];  Service: Cardiovascular;  Laterality: N/A;   DIALYSIS/PERMA CATHETER REMOVAL N/A 06/02/2020   Procedure: DIALYSIS/PERMA CATHETER REMOVAL;  Surgeon: Algernon Huxley, MD;  Location: [place] [facility];  Service: Cardiovascular;  Laterality: N/A;   EXCHANGE OF A DIALYSIS CATHETER Right 04/10/2020   Procedure: EXCHANGE OF A DIALYSIS CATHETER;  Surgeon: Jules Husbands, MD;  Location: [facility];  Service: General;  Laterality: Right;   [place]  01/20/2021   Procedure: HERNIA REPAIR INCISIONAL;  Surgeon: Olean Ree, MD;  Location: [facility];  Service: General;;   IR IMAGE GUIDED DRAINAGE PERCUT CATH  PERITONEAL RETROPERIT  04/07/2020   LAPAROSCOPY N/A 04/16/2021   Procedure: LAPAROSCOPY DIAGNOSTIC;  Surgeon: Benjamine Sprague, DO;  Location: [facility];  Service: General;  Laterality: N/A;   LEFT HEART CATH AND CORONARY ANGIOGRAPHY Left 12/20/2018   Procedure: LEFT HEART CATH AND CORONARY ANGIOGRAPHY;  Surgeon: Isaias Cowman, MD;  Location: [place] [facility];  Service: Cardiovascular;  Laterality: Left;   RADIAL ARTERY HARVEST Left 12/27/2018   Procedure: ENDOSCOPIC RADIAL ARTERY HARVEST;  Surgeon: [name], MD;  Location: [place];  Service: Open Heart Surgery;  Laterality: Left;   REMOVAL OF A DIALYSIS CATHETER Left 03/20/2021   Procedure: REMOVAL OF A PD CATHETER;  Surgeon: Algernon Huxley, MD;  Location: [facility];   Service: Vascular;  Laterality: Left;   TEE WITHOUT CARDIOVERSION N/A 12/27/2018   Procedure: TRANSESOPHAGEAL ECHOCARDIOGRAM (TEE);  Surgeon: [name], MD;  Location: [place];  Service: Open Heart Surgery;  Laterality: N/A;   TUMOR REMOVAL  2019   Bladder    Family History  Family history unknown: Yes    No Known Allergies     Latest Ref Rng & Units 04/15/2021    3:29 AM 04/09/2021    3:52 AM 04/07/2021   10:58 PM  CBC  WBC 4.0 - 10.5 K/uL 6.0   6.3   5.1    Hemoglobin 13.0 - 17.0 g/dL 8.5  8.7   8.7    Hematocrit 39.0 - 52.0 % 29.3   29.7   29.8    Platelets 150 - 400 K/uL 219   183   177        CMP     Component Value Date/Time   NA 142 04/16/2021 0800   NA 143 01/04/2020 1542   K 4.2 04/16/2021 0800   CL 105 04/16/2021 0800   CO2 24 04/16/2021 0800   GLUCOSE 84 04/16/2021 0800   BUN 63 (H) 04/16/2021 0800   BUN 52 (H) 01/04/2020 1542   CREATININE 7.84 (H) 04/16/2021 0800   CREATININE 4.62 (H) 01/10/2019 1351   CALCIUM 7.3 (L) 04/16/2021 0800   PROT 4.8 (L) 04/09/2021 0352   PROT 5.7 (L) 01/04/2020 1542   ALBUMIN 2.3 (L) 04/16/2021 0800   ALBUMIN 3.5 (L) 01/04/2020 1542   AST 31 04/09/2021 0352   ALT 47 (H) 04/09/2021 0352   ALKPHOS 49 04/09/2021 0352   BILITOT 0.5 04/09/2021 0352   BILITOT <0.2 01/04/2020 1542   GFRNONAA 7 (L) 04/16/2021 0800   GFRAA 15 (L) 01/04/2020 1542     No results found.     Assessment & Plan:   1. ESRD (end stage renal disease) ([place]) Recommend:  At this time the patient does not have appropriate extremity access for dialysis  Patient should have a left brachial axillary AV graft created.  The risks, benefits and alternative therapies were reviewed in detail with the patient.  All questions were answered.  The patient agrees to proceed with surgery.   The patient will follow up with me in the office after the surgery.   2. Primary hypertension Continue antihypertensive medications as already ordered, these  medications have been reviewed and there are no changes at this time.    Current Outpatient Medications on File Prior to Visit  Medication Sig Dispense Refill   acetaminophen (TYLENOL) 500 MG tablet Take 2 tablets (1,000 mg total) by mouth every 6 (six) hours as needed for mild pain.     ascorbic acid (VITAMIN C) 500 MG tablet Take 1 tablet (500 mg total) by mouth daily. 90 tablet 0   aspirin EC 81 MG tablet Take 81 mg by mouth daily.     atorvastatin (LIPITOR) 40 MG tablet Take 1 tablet (40 mg total) by mouth daily. 30 tablet 1   benzonatate (TESSALON) 200 MG capsule Take 200 mg by mouth 3 (three) times daily as needed.     calcium carbonate (OS-CAL - DOSED IN MG OF ELEMENTAL CALCIUM) 1250 (500 Ca) MG tablet Take 1 tablet (1,250 mg total) by mouth 2 (two) times daily between meals. 60 tablet 1   magnesium oxide (MAG-OX) 400 (240 Mg) MG tablet Take 1 tablet (400 mg total) by mouth daily. 30 tablet 1   metoprolol succinate (TOPROL-XL) 50 MG 24 hr tablet Take 0.5 tablets (25 mg total) by mouth daily. Take with or immediately following a meal. 30 tablet 1   multivitamin (RENA-VIT) TABS tablet Take 1 tablet by mouth at bedtime. 30 tablet 1   Nutritional Supplements (FEEDING SUPPLEMENT, NEPRO CARB STEADY,) LIQD Take 237 mLs by mouth 3 (three) times daily between meals.  0   ondansetron (ZOFRAN-ODT) 8 MG disintegrating tablet Take 8 mg by mouth every 8 (eight) hours as needed.     sacubitril-valsartan (ENTRESTO) 24-26 MG Take 1 tablet by mouth 2 (two) times daily. 60 tablet 1   VELPHORO 500 MG chewable tablet Chew 1,000 mg by  mouth 3 (three) times daily.     Vitamin D, Ergocalciferol, (DRISDOL) 1.25 MG (50000 UNIT) CAPS capsule Take 1 capsule (50,000 Units total) by mouth every 7 (seven) days. (Patient taking differently: Take 50,000 Units by mouth every Saturday.) 5 capsule 1   zinc sulfate 220 (50 Zn) MG capsule Take 1 capsule (220 mg total) by mouth daily. 90 capsule 0   No current  facility-administered medications on file prior to visit.    There are no Patient Instructions on file for this visit. No follow-ups on file.   Kris Hartmann, NP

## 2021-07-21 ENCOUNTER — Encounter (INDEPENDENT_AMBULATORY_CARE_PROVIDER_SITE_OTHER): Payer: Self-pay | Admitting: Nurse Practitioner

## 2021-07-24 ENCOUNTER — Encounter
Admission: RE | Admit: 2021-07-24 | Discharge: 2021-07-24 | Disposition: A | Discharge status: Home / Self Care | Payer: Medicare Other | Source: Ambulatory Visit | Attending: Vascular Surgery | Admitting: Vascular Surgery

## 2021-07-24 ENCOUNTER — Other Ambulatory Visit (INDEPENDENT_AMBULATORY_CARE_PROVIDER_SITE_OTHER): Payer: Self-pay | Admitting: Nurse Practitioner

## 2021-07-24 VITALS — Ht 63.0 in | Wt 127.0 lb

## 2021-07-24 DIAGNOSIS — I129 Hypertensive chronic kidney disease with stage 1 through stage 4 chronic kidney disease, or unspecified chronic kidney disease: Secondary | ICD-10-CM

## 2021-07-24 DIAGNOSIS — I4891 Unspecified atrial fibrillation: Secondary | ICD-10-CM

## 2021-07-24 DIAGNOSIS — I10 Essential (primary) hypertension: Secondary | ICD-10-CM

## 2021-07-24 DIAGNOSIS — N186 End stage renal disease: Secondary | ICD-10-CM

## 2021-07-24 NOTE — Patient Instructions (Addendum)
Your procedure is scheduled on: Wednesday July 29, 2021. Report to Day Surgery inside [facility] 2nd floor, stop by admissions desk before getting on elevator.  To find out your arrival time please call [phone] between 1PM - 3PM on Tuesday July 28, 2021.  Remember: Instructions that are not followed completely may result in serious medical risk,  up to and including death, or upon the discretion of your surgeon and anesthesiologist your  surgery may need to be rescheduled.     _X__ 1. Do not eat food or drink fluids after midnight the night before your procedure.                 No chewing gum or hard candies.   __X__2.  On the morning of surgery brush your teeth with toothpaste and water, you                may rinse your mouth with mouthwash if you wish.  Do not swallow any toothpaste or mouthwash.     _X__ 3.  No Alcohol for 24 hours before or after surgery.   _X__ 4.  Do Not Smoke or use e-cigarettes For 24 Hours Prior to Your Surgery.                 Do not use any chewable tobacco products for at least 6 hours prior to                 Surgery.  _X__  5.  Do not use any recreational drugs (marijuana, cocaine, heroin, ecstasy, MDMA or other)                For at least one week prior to your surgery.  Combination of these drugs with anesthesia                May have life threatening results.  ____  6.  Bring all medications with you on the day of surgery if instructed.   __X__  7.  Notify your doctor if there is any change in your medical condition      (cold, fever, infections).     Do not wear jewelry, make-up, hairpins, clips or nail polish. Do not wear lotions, powders, or perfumes or deodorant. Do not shave 48 hours prior to surgery.  Do not bring valuables to the hospital.    [facility] is not responsible for any belongings or valuables.  Contacts, dentures or bridgework may not be worn into surgery. Leave your suitcase in the car. After  surgery it may be brought to your room. For patients admitted to the hospital, discharge time is determined by your treatment team.   Patients discharged the day of surgery will not be allowed to drive home.   Make arrangements for someone to be with you for the first 24 hours of your Same Day Discharge.  __X__ Take these medicines the morning of surgery with A SIP OF WATER:    1. metoprolol succinate (TOPROL-XL) 50 MG (1/2 tablet)  2.   3.   4.  5.  6.  ____ Fleet Enema (as directed)   __X__ Use CHG Soap (or wipes) as directed  ____ Use Benzoyl Peroxide Gel as instructed  ____ Use inhalers on the day of surgery  ____ Stop metformin 2 days prior to surgery    ____ Take 1/2 of usual insulin dose the night before surgery. No insulin the morning  of surgery.   ____ Call your PCP, cardiologist, or Pulmonologist if taking Coumadin/Plavix/aspirin and ask when to stop before your surgery.   __X__ One Week prior to surgery- Stop Anti-inflammatories such as Ibuprofen, Aleve, Advil, Motrin, meloxicam (MOBIC), diclofenac, etodolac, ketorolac, Toradol, Daypro, piroxicam, Goody's or BC powders. OK TO USE TYLENOL IF NEEDED   __X__ Stop ALL supplements until after surgery. Vitamin C    ____ Bring C-Pap to the hospital.    If you have any questions regarding your pre-procedure instructions,  Please call Pre-admit Testing at [phone]

## 2021-07-27 ENCOUNTER — Telehealth (INDEPENDENT_AMBULATORY_CARE_PROVIDER_SITE_OTHER): Payer: Self-pay

## 2021-07-27 ENCOUNTER — Encounter (HOSPITAL_COMMUNITY): Payer: Self-pay | Admitting: Urgent Care

## 2021-07-27 ENCOUNTER — Encounter
Admission: RE | Admit: 2021-07-27 | Discharge: 2021-07-27 | Disposition: A | Discharge status: Home / Self Care | Payer: Medicare Other | Source: Ambulatory Visit | Attending: Vascular Surgery | Admitting: Vascular Surgery

## 2021-07-27 DIAGNOSIS — Z01818 Encounter for other preprocedural examination: Secondary | ICD-10-CM | POA: Diagnosis present

## 2021-07-27 DIAGNOSIS — N189 Chronic kidney disease, unspecified: Secondary | ICD-10-CM | POA: Diagnosis not present

## 2021-07-27 DIAGNOSIS — I129 Hypertensive chronic kidney disease with stage 1 through stage 4 chronic kidney disease, or unspecified chronic kidney disease: Secondary | ICD-10-CM | POA: Diagnosis not present

## 2021-07-27 DIAGNOSIS — I4891 Unspecified atrial fibrillation: Secondary | ICD-10-CM | POA: Insufficient documentation

## 2021-07-27 DIAGNOSIS — I10 Essential (primary) hypertension: Secondary | ICD-10-CM

## 2021-07-27 DIAGNOSIS — N186 End stage renal disease: Secondary | ICD-10-CM | POA: Diagnosis not present

## 2021-07-27 LAB — CBC
HCT: 41.8 % (ref 39.0–52.0)
Hemoglobin: 12.5 g/dL — ABNORMAL LOW (ref 13.0–17.0)
MCH: 27 pg (ref 26.0–34.0)
MCHC: 29.9 g/dL — ABNORMAL LOW (ref 30.0–36.0)
MCV: 90.3 fL (ref 80.0–100.0)
Platelets: 248 10*3/uL (ref 150–400)
RBC: 4.63 MIL/uL (ref 4.22–5.81)
RDW: 19.1 % — ABNORMAL HIGH (ref 11.5–15.5)
WBC: 8.5 10*3/uL (ref 4.0–10.5)
nRBC: 0 % (ref 0.0–0.2)

## 2021-07-27 LAB — TYPE AND SCREEN
ABO/RH(D): A POS
Antibody Screen: NEGATIVE

## 2021-07-27 LAB — BASIC METABOLIC PANEL
Anion gap: 14 (ref 5–15)
BUN: 68 mg/dL — ABNORMAL HIGH (ref 8–23)
CO2: 22 mmol/L (ref 22–32)
Calcium: 7.3 mg/dL — ABNORMAL LOW (ref 8.9–10.3)
Chloride: 107 mmol/L (ref 98–111)
Creatinine, Ser: 7.42 mg/dL — ABNORMAL HIGH (ref 0.61–1.24)
GFR, Estimated: 8 mL/min — ABNORMAL LOW (ref >60)
Glucose, Bld: 79 mg/dL (ref 70–99)
Potassium: 5.3 mmol/L — ABNORMAL HIGH (ref 3.5–5.1)
Sodium: 143 mmol/L (ref 135–145)

## 2021-07-27 NOTE — Telephone Encounter (Signed)
I spoke with the patient and his spouse on 07/24/21 and had the patient moved to 07/30/21 for his surgery with Dr. Lucky Cowboy. New pre-surgical instructions will be mailed.

## 2021-07-27 NOTE — OR Nursing (Signed)
Call received in Same Day Surgery from [facility] in Dr. Alveria Apley office.  This patient has not been seen in the office since 2022.  Clearance for upcoming surgery, 07/30/21, will not be provided until the patient is seen in the office.  Anthony Hess has been unable to reach patient to schedule office visit, updated phone numbers provided.  She will continue to reach out to the patient but advises that there are available appointments that would allow for clearance to be completed prior to 6/8.

## 2021-07-27 NOTE — Telephone Encounter (Signed)
Katie from pre-admitt called to informed that patient surgery will need to be reschedule and patient will need cardiac clearance. A detailed message was left on patient voicemail with surgery information

## 2021-07-29 ENCOUNTER — Telehealth (INDEPENDENT_AMBULATORY_CARE_PROVIDER_SITE_OTHER): Payer: Self-pay

## 2021-07-29 ENCOUNTER — Encounter: Payer: Self-pay | Admitting: Urgent Care

## 2021-07-29 MED ORDER — FAMOTIDINE 20 MG PO TABS: 20.0000 mg | ORAL_TABLET | Freq: Once | ORAL | Status: AC

## 2021-07-29 MED ORDER — LACTATED RINGERS IV SOLN: INTRAVENOUS | Status: DC

## 2021-07-29 MED ORDER — CHLORHEXIDINE GLUCONATE 0.12 % MT SOLN: 15.0000 mL | Freq: Once | OROMUCOSAL | Status: AC

## 2021-07-29 MED ORDER — ORAL CARE MOUTH RINSE: 15.0000 mL | Freq: Once | OROMUCOSAL | Status: AC

## 2021-07-29 MED ORDER — CHLORHEXIDINE GLUCONATE CLOTH 2 % EX PADS
6.0000 | MEDICATED_PAD | Freq: Once | CUTANEOUS | Status: AC
  Administered 2021-07-30: 6 via TOPICAL

## 2021-07-29 MED ORDER — CHLORHEXIDINE GLUCONATE CLOTH 2 % EX PADS: 6.0000 | MEDICATED_PAD | Freq: Once | CUTANEOUS | Status: DC

## 2021-07-29 MED ORDER — CEFAZOLIN SODIUM-DEXTROSE 2-4 GM/100ML-% IV SOLN
2.0000 g | INTRAVENOUS | Status: AC
  Administered 2021-07-30: 2 g via INTRAVENOUS

## 2021-07-29 NOTE — Telephone Encounter (Signed)
Patient has been rescheduled for his surgery with Dr. Lucky Cowboy on 07/30/21. Patient was informed of his surgery and the time he was told to be at [facility] was 11:45 am.

## 2021-07-29 NOTE — Telephone Encounter (Signed)
I spoke with the patient's spouse and patient and let them both know that once I have a cardiac clearance. I will call to reschedule the patient for his surgery. Cardiac clearance was requested by anesthesia for the patient to have surgery.

## 2021-07-29 NOTE — Progress Notes (Signed)
Perioperative Services  Pre-Admission/Anesthesia Testing Clinical Review  Date: 07/29/21  Patient Demographics:  Name: Anthony Hess DOB:   [birth year] MRN:   [id number]  Planned Surgical Procedure(s):    Case: [id number] Date: 07/30/21   Procedure: INSERTION OF ARTERIOVENOUS (AV) GORE-TEX GRAFT ARM (Left)   Anesthesia type: General   Pre-op diagnosis: ESRD   Location: [facility]S FOR ANEST[facility]ESIA [facility]   Surgeons: Algernon Huxley, MD   NOTE: Available PAT nursing documentation and vital signs have been reviewed. Clinical nursing staff has updated patient's PMH/PSHx, current medication list, and drug allergies/intolerances to ensure comprehensive history available to assist in medical decision making as it pertains to the aforementioned surgical procedure and anticipated anesthetic course. Extensive review of available clinical information performed. Anthony Hess PMH and PSHx updated with any diagnoses/procedures that  may have been inadvertently omitted during his intake with the pre-admission testing department's nursing staff.  Clinical Discussion:  Anthony Hess is a 66 y.o. male who is submitted for pre-surgical anesthesia review and clearance prior to him undergoing the above procedure.Patient is a Former Smoker (quit 01/2019). Pertinent PMH includes: CAD (s/p CABG x 4), MI, DCM, HFrEF, PVD, infrarenal AAA, aortic atherosclerosis, BILATERAL carotid artery stenosis, HTN, HLD, ESRD (on hemodialysis), bladder cancer.  Patient is followed by cardiology Nehemiah Massed, MD). He was last seen in the cardiology clinic on 07/29/2021. Full note pending dictation/completion at time of consult. Patient with a past medical history significant for cardiovascular diagnoses.  Patient reported history of MI in the past, however details regarding this cardiovascular event unknown at time of consult.  Patient with significant angina back in 11/2018.  Given the degree and of his symptoms, patient underwent cardiovascular  work-up.  Patient underwent myocardial perfusion imaging study on 12/01/2018 revealing a severely reduced left ventricular systolic function with an EF of <30%.  Moderate perfusion abnormality noted in the basal anterior, basal anteroseptal, basal anterolateral, mid anterior, mid anteroseptal, anterolateral, apical anterior, and apex locations.  Findings felt to be consistent with prior myocardial infarction with peri-infarct ischemia.  TTE performed on 12/05/2018 revealed a moderately reduced left ventricular systolic function with an EF of 40-45%.  There was mild LVH.  Anterior, apical, and septal hypokinesis noted.  RVSF normal.  There was mild tricuspid valve regurgitation.  Aortic valve sclerosis present with no evidence of stenosis.  Diagnostic left heart catheterization was performed on 12/20/2018 revealing multivessel CAD; 50% OM1, 40% OM2, 95% ostial to proximal LAD, 75% ostial proximal LCx, 40% mid LM, 70% D1, 60% mid RCA-1, and 50% mid RCA-2.  Patient was referred to CVTS for consultation regarding CABG procedure.  Patient underwent four-vessel CABG procedure on 12/27/2018 at [place], RIMA-PDA, and sequential LEFT radial artery graft-OM1-D1 bypass grafts were placed.  Most recent TTE was performed on 12/28/2019 revealing a severely reduced left ventricular systolic function with an EF of 20-25%.  There were no regional wall motion abnormalities.  Left ventricular cavity moderately dilated. Diastolic Doppler parameters consistent with abnormal relaxation (G1DD). RVSF normal.  There was moderate mitral and mild to moderate aortic valve stenosis.  There was no significant transvalvular gradient to suggest stenosis.  Patient underwent routine CT imaging of the abdomen pelvis without contrast on 03/05/2021 to further assess abdominal pain in the setting of his peritoneal dialysis with cloudy dialysate.  Incidental finding made of a 3.2 cm infrarenal abdominal aortic  aneurysm.  Heart failure and blood pressure well controlled on currently prescribed beta-blocker and ARB/ANRi (Entresto) regimen; blood pressure documented  at 131/86.  Patient is on a statin for his HLD and further ASCVD prevention.  He is not diabetic.  Patient compliant with hemodialysis regimen. Functional capacity, as defined by DASI, is documented as being >/= 4 METS. No changes were made to his medication regimen.  Patient follow-up with outpatient cardiology in 1 year or sooner if needed.  Anthony Hess is scheduled for the INSERTION OF LEFT UPPER EXTREMITY ARTERIOVENOUS (AV) GORE-TEX GRAFT on 07/30/2021 with Dr. [name], MD. Given patient's past medical history significant for cardiovascular diagnoses, presurgical cardiac clearance was sought by the  PAT team. "The patient is at the lowest risk possible for perioperative cardiovascular complications with the planned procedure.  The overall risk his procedure is low (<1%).  Currently has no evidence active and/or significant angina and/or congestive heart failure. Patient may proceed to surgery without restriction or need for further cardiovascular testing and an overall LOW risk". This patient is on daily antiplatelet therapy.  Per standing orders from Dr. Lucky Cowboy for this procedure, patient may continue his daily low-dose ASA throughout his perioperative course.  Patient denies previous perioperative complications with anesthesia in the past. In review of the available records, it is noted that patient underwent a general anesthetic course here at [facility] (ASA IV) in 03/2021 without documented complications.      07/24/2021   12:57 PM 07/15/2021    3:35 PM 04/17/2021    2:25 PM  Vitals with BMI  Height '5\' 3"'$  '5\' 3"'$    Weight 127 lbs 122 lbs   BMI 29.4 76.54   Systolic  650 92  Diastolic  87 57  Pulse  86 55    Providers/Specialists:   NOTE: Primary physician provider listed below. Patient may have been  seen by APP or partner within same practice.   PROVIDER ROLE / SPECIALTY LAST Imelda Pillow, MD Vascular Surgery (Surgeon) 07/15/2021  Pcp, No Primary Care Provider ???  Serafina Royals, MD Cardiology 07/29/2021  Anthonette Legato, MD Nephrology 06/23/2021   Allergies:  Patient has no known allergies.  Current Home Medications:   No current facility-administered medications for this encounter.    acetaminophen (TYLENOL) 500 MG tablet   ascorbic acid (VITAMIN C) 500 MG tablet   aspirin EC 81 MG tablet   atorvastatin (LIPITOR) 40 MG tablet   benzonatate (TESSALON) 200 MG capsule   calcitRIOL (ROCALTROL) 0.5 MCG capsule   calcium carbonate (OS-CAL - DOSED IN MG OF ELEMENTAL CALCIUM) 1250 (500 Ca) MG tablet   magnesium oxide (MAG-OX) 400 (240 Mg) MG tablet   metoprolol succinate (TOPROL-XL) 50 MG 24 hr tablet   multivitamin (RENA-VIT) TABS tablet   Nutritional Supplements (FEEDING SUPPLEMENT, NEPRO CARB STEADY,) LIQD   ondansetron (ZOFRAN-ODT) 8 MG disintegrating tablet   sacubitril-valsartan (ENTRESTO) 24-26 MG   VELPHORO 500 MG chewable tablet   Vitamin D, Ergocalciferol, (DRISDOL) 1.25 MG (50000 UNIT) CAPS capsule   zinc sulfate 220 (50 Zn) MG capsule   History:   Past Medical History:  Diagnosis Date   Aortic atherosclerosis (HCC)    Bilateral carotid artery disease (HCC)    Bladder cancer (HCC)    Coronary artery disease 12/20/2018   a.) LHC 12/20/2018: 50% OM1, 40% OM2, 95% o-pLAD, 75% o=pLCx, 40% mLM, 70% D1, 60% mRCA-1, 50% mRCA-2; refer to CVTS. b.) 4v CABG at [facility] on 12/27/2018: LIMA-LAD, RIMA-PDA, seg LRA-OM1-D1   DCM (dilated cardiomyopathy) ([place]) 12/05/2018   a.) TTE 12/05/2018: EF 40-45%. b.) TTE 12/28/2019:  EF 20-25%.   ESRD (end stage renal disease) ([place])    HFrEF (heart failure with reduced ejection fraction) ([place]) 12/05/2018   a.) TTE 12/05/2018: EF 40-45%; mild LVH; ant/apical/sep HK; mild TR . b.) TTE 12/28/2019: EF 20-25%; mod LVH; mod MR/AR; G1DD.    History of 2019 novel coronavirus disease (COVID-19) 04/08/2021   History of kidney stones    HLD (hyperlipidemia)    Hypertension    Infrarenal abdominal aortic aneurysm (AAA) without rupture ([place]) 03/05/2021   a.) CT abd/pelvis; measured 3.2 cm.   Myocardial infarction [facility])    PVD (peripheral vascular disease) ([place])    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   a.) LIMA-LAD, RIMA-PDA, sequential LEFT radial artery to OM1 and D1   Sepsis ([place]) 03/14/2021   Wears glasses    Past Surgical History:  Procedure Laterality Date   CAPD INSERTION N/A 12/31/2019   Procedure: LAPAROSCOPIC INSERTION CONTINUOUS AMBULATORY PERITONEAL DIALYSIS  (CAPD) CATHETER;  Surgeon: Jules Husbands, MD;  Location: [facility] ORS;  Service: General;  Laterality: N/A;   CAPD REMOVAL N/A 04/10/2020   Procedure: LAPAROSCOPIC REVISION OF CONTINUOUS AMBULATORY PERITONEAL DIALYSIS  (CAPD) CATHETER;  Surgeon: Jules Husbands, MD;  Location: [facility] ORS;  Service: General;  Laterality: N/A;   CORONARY ARTERY BYPASS GRAFT N/A 12/27/2018   Procedure: CORONARY ARTERY BYPASS GRAFTING (CABG) X 4 ON PUMP USING RIGHT & LEFT INTERNAL MAMMARY ARTERY LEFT RADIAL ARTERY ENDOSCOPICALLY HARVESTED;  Surgeon: [name], MD;  Location: [place];  Service: Open Heart Surgery;  Laterality: N/A;   CYSTOSCOPY W/ RETROGRADES Bilateral 05/15/2019   Procedure: CYSTOSCOPY WITH RETROGRADE PYELOGRAM;  Surgeon: Abbie Sons, MD;  Location: [facility] ORS;  Service: Urology;  Laterality: Bilateral;   CYSTOSCOPY WITH BIOPSY N/A 05/15/2019   Procedure: CYSTOSCOPY WITH bladder BIOPSY;  Surgeon: Abbie Sons, MD;  Location: [facility];  Service: Urology;  Laterality: N/A;   DIALYSIS/PERMA CATHETER INSERTION N/A 12/28/2019   Procedure: DIALYSIS/PERMA CATHETER INSERTION;  Surgeon: Algernon Huxley, MD;  Location: [place] CV LAB;  Service: Cardiovascular;  Laterality: N/A;   DIALYSIS/PERMA CATHETER INSERTION N/A 03/18/2021   Procedure: DIALYSIS/PERMA CATHETER INSERTION;  Surgeon:  Algernon Huxley, MD;  Location: [place] CV LAB;  Service: Cardiovascular;  Laterality: N/A;   DIALYSIS/PERMA CATHETER REMOVAL N/A 06/02/2020   Procedure: DIALYSIS/PERMA CATHETER REMOVAL;  Surgeon: Algernon Huxley, MD;  Location: [place] CV LAB;  Service: Cardiovascular;  Laterality: N/A;   EXCHANGE OF A DIALYSIS CATHETER Right 04/10/2020   Procedure: EXCHANGE OF A DIALYSIS CATHETER;  Surgeon: Jules Husbands, MD;  Location: [facility];  Service: General;  Laterality: Right;   [place]  01/20/2021   Procedure: HERNIA REPAIR INCISIONAL;  Surgeon: Olean Ree, MD;  Location: [facility] OR[facility];  Service: General;;   IR IMAGE GUIDED DRAINAGE PERCUT CATH  PERITONEAL RETROPERIT  04/07/2020   LAPAROSCOPY N/A 04/16/2021   Procedure: LAPAROSCOPY DIAGNOSTIC;  Surgeon: Benjamine Sprague, DO;  Location: [facility]S;  Service: General;  Laterality: N/A;   LEFT HEART CATH AND CORONARY ANGIOGRAPHY Left 12/20/2018   Procedure: LEFT HEART CATH AND CORONARY ANGIOGRAPHY;  Surgeon: Isaias Cowman, MD;  Location: [place] CV LAB;  Service: Cardiovascular;  Laterality: Left;   RADIAL ARTERY HARVEST Left 12/27/2018   Procedure: ENDOSCOPIC RADIAL ARTERY HARVEST;  Surgeon: [name], MD;  Location: [place];  Service: Open Heart Surgery;  Laterality: Left;   REMOVAL OF A DIALYSIS CATHETER Left 03/20/2021   Procedure: REMOVAL OF A PD CATHETER;  Surgeon: Algernon Huxley, MD;  Location: [facility];  Service: Vascular;  Laterality: Left;   TEE WITHOUT CARDIOVERSION N/A 12/27/2018   Procedure: TRANSESOPHAGEAL ECHOCARDIOGRAM (TEE);  Surgeon: [name], MD;  Location: [place];  Service: Open Heart Surgery;  Laterality: N/A;   TUMOR REMOVAL  2019   Bladder   Family History  Family history unknown: Yes   Social History   Tobacco Use   Smoking status: Former    Packs/day: 0.25    Types: Cigarettes    Quit date: 02/19/2019    Years since quitting: 2.4   Smokeless tobacco: Never  Vaping Use   Vaping Use:  Never used  Substance Use Topics   Alcohol use: Never   Drug use: Never    Pertinent Clinical Results:  LABS: Labs reviewed: Acceptable for surgery.  Hospital Outpatient Visit on 07/27/2021  Component Date Value Ref Range Status   ABO/RH(D) 07/27/2021 A POS   Final   Antibody Screen 07/27/2021 NEG   Final   Sample Expiration 07/27/2021 08/10/2021,2359   Final   Extend sample reason 07/27/2021    Final                   Value:NO TRANSFUSIONS OR PREGNANCY IN THE PAST 3 MONTHS Performed at [facility], [place], [place] [address]    Sodium 07/27/2021 143  135 - 145 mmol/L Final   Potassium 07/27/2021 5.3 (H)  3.5 - 5.1 mmol/L Final   Chloride 07/27/2021 107  98 - 111 mmol/L Final   CO2 07/27/2021 22  22 - 32 mmol/L Final   Glucose, Bld 07/27/2021 79  70 - 99 mg/dL Final   Glucose reference range applies only to samples taken after fasting for at least 8 hours.   BUN 07/27/2021 68 (H)  8 - 23 mg/dL Final   Creatinine, Ser 07/27/2021 7.42 (H)  0.61 - 1.24 mg/dL Final   Calcium 07/27/2021 7.3 (L)  8.9 - 10.3 mg/dL Final   GFR, Estimated 07/27/2021 8 (L)  >60 mL/min Final   Comment: (NOTE) Calculated using the CKD-EPI Creatinine Equation (2021)    Anion gap 07/27/2021 14  5 - 15 Final   Performed at [facility], [address] [place], [place] [address]   WBC 07/27/2021 8.5  4.0 - 10.5 K/uL Final   RBC 07/27/2021 4.63  4.22 - 5.81 MIL/uL Final   Hemoglobin 07/27/2021 12.5 (L)  13.0 - 17.0 g/dL Final   HCT 07/27/2021 41.8  39.0 - 52.0 % Final   MCV 07/27/2021 90.3  80.0 - 100.0 fL Final   MCH 07/27/2021 27.0  26.0 - 34.0 pg Final   MCHC 07/27/2021 29.9 (L)  30.0 - 36.0 g/dL Final   RDW 07/27/2021 19.1 (H)  11.5 - 15.5 % Final   Platelets 07/27/2021 248  150 - 400 K/uL Final   nRBC 07/27/2021 0.0  0.0 - 0.2 % Final   Performed at [facility], [address] [place], [place] [address]    ECG: Date: 07/27/2021 Time ECG obtained:  0835 AM Rate: 62 bpm Rhythm: normal sinus Axis (leads I and aVF): Left axis deviation Intervals: PR 154 ms. QRS 88 ms. QTc 479 ms. ST segment and T wave changes: No evidence of acute ST segment elevation or depression Comparison: Similar to previous tracing obtained on 04/08/2021   IMAGING / PROCEDURES: TRANSTHORACIC ECHOCARDIOGRAM performed on 12/28/2019 Left ventricular ejection fraction, by estimation, is 20 to 25%. The left ventricle  has severely decreased function. The left ventricle has no regional wall motion abnormalities. The left ventricular internal cavity size was moderately dilated. Left ventricular diastolic parameters are consistent with Grade I diastolic dysfunction (impaired relaxation). Right ventricular systolic function is normal. The right ventricular size is normal.  The mitral valve is normal in structure. Moderate mitral valve regurgitation. No evidence of mitral stenosis.  The aortic valve is normal in structure. Aortic valve regurgitation is mild to moderate. No aortic stenosis is present.  The inferior vena cava is normal in size with greater than 50% respiratory variability, suggesting right atrial pressure of 3 mmHg.   CORONARY ARTERY BYPASS GRAFTING performed on 12/27/2018 Four-vessel CABG procedure LIMA-LAD RIMA-PDA Sequential left radial artery (LRA) graft to OM1 and D1  LEFT HEART CATHETERIZATION AND CORONARY ANGIOGRAPHY performed on 12/20/2018 Moderately reduced left ventricular systolic function with dilated cardiomyopathy; LVEF 30-35% Multivessel CAD 50% stenosis of OM1 40% stenosis of OM 2 95% stenosis of ostial to proximal LAD 75% stenosis of ostial to proximal LCx 40% stenosis of mid LM 70% stenosis of D1 60% stenosis mid RCA-1 50% stenosis of mid RCA-2 Recommendations Refer to CVTS for consideration of CABG procedure    MYOCARDIAL PERFUSION IMAGING STUDY (LEXISCAN) performed on 12/01/2018 Severely reduced left ventricular systolic  function with an EF of <30% Medium defect of moderate severity present in the basal anterior, basal anteroseptal, basal anterolateral, mid anterior, mid anteroseptal, mid anterolateral, apical anterior, and apex locations.  Findings consistent with prior myocardial infarction with peri-infarct ischemia No T wave inversion noted during stress Blood pressure demonstrated normal response to exercise This is a high risk study  Impression and Plan:  Aquilla Voiles has been referred for pre-anesthesia review and clearance prior to him undergoing the planned anesthetic and procedural courses. Available labs, pertinent testing, and imaging results were personally reviewed by me. This patient has been appropriately cleared by cardiology with an overall LOW risk of significant perioperative cardiovascular complications.  Based on clinical review performed today (07/29/21), barring any significant acute changes in the patient's overall condition, it is anticipated that he will be able to proceed with the planned surgical intervention. Any acute changes in clinical condition may necessitate his procedure being postponed and/or cancelled. Patient will meet with anesthesia team (MD and/or CRNA) on the day of his procedure for preoperative evaluation/assessment. Questions regarding anesthetic course will be fielded at that time.   Pre-surgical instructions were reviewed with the patient during his PAT appointment and questions were fielded by PAT clinical staff. Patient was advised that if any questions or concerns arise prior to his procedure then he should return a call to PAT and/or his surgeon's office to discuss.  Honor Loh, MSN, APRN, FNP-C, CEN [facility]  Peri-operative Services Nurse Practitioner Phone: [phone] Fax: [phone] 07/29/21 1:53 PM  NOTE: This note has been prepared using Dragon dictation software. Despite my best ability to proofread, there is always the  potential that unintentional transcriptional errors may still occur from this process.

## 2021-07-29 NOTE — Telephone Encounter (Signed)
Pt's wife called stating that Pt is at [facility] right now and if clearance is faxed over today can he still have surgery tomorrow; I told her that would probably be a no but I do not schedule any type of appts.  She sates that the dialysis center can't pull hardly anything off and they gave him heparin yesterday and he has to go back today because they are worried his kidneys will fail soon if something is not done. She is very concerned.  I told her that I do not do anything with the scheduling but Mickel Baas would give her a call as soon as she gets the Cardiac clearance paperwork.

## 2021-07-30 ENCOUNTER — Encounter: Admission: RE | Payer: Self-pay | Source: Home / Self Care

## 2021-07-30 ENCOUNTER — Encounter: Payer: Self-pay | Admitting: Vascular Surgery

## 2021-07-30 ENCOUNTER — Ambulatory Visit
Admission: RE | Admit: 2021-07-30 | Discharge: 2021-07-30 | Disposition: A | Discharge status: Home / Self Care | Payer: Medicare Other | Attending: Vascular Surgery | Admitting: Vascular Surgery

## 2021-07-30 ENCOUNTER — Ambulatory Visit: Payer: Medicare Other | Admitting: Urgent Care

## 2021-07-30 ENCOUNTER — Encounter
Admission: RE | Disposition: A | Discharge status: Home / Self Care | Payer: Self-pay | Source: Home / Self Care | Attending: Vascular Surgery

## 2021-07-30 ENCOUNTER — Ambulatory Visit: Admission: RE | Admit: 2021-07-30 | Payer: Medicare Other | Source: Home / Self Care | Admitting: Vascular Surgery

## 2021-07-30 ENCOUNTER — Other Ambulatory Visit: Payer: Self-pay

## 2021-07-30 DIAGNOSIS — J449 Chronic obstructive pulmonary disease, unspecified: Secondary | ICD-10-CM | POA: Insufficient documentation

## 2021-07-30 DIAGNOSIS — Z992 Dependence on renal dialysis: Secondary | ICD-10-CM | POA: Diagnosis not present

## 2021-07-30 DIAGNOSIS — I509 Heart failure, unspecified: Secondary | ICD-10-CM | POA: Insufficient documentation

## 2021-07-30 DIAGNOSIS — I132 Hypertensive heart and chronic kidney disease with heart failure and with stage 5 chronic kidney disease, or end stage renal disease: Secondary | ICD-10-CM | POA: Insufficient documentation

## 2021-07-30 DIAGNOSIS — I739 Peripheral vascular disease, unspecified: Secondary | ICD-10-CM | POA: Diagnosis not present

## 2021-07-30 DIAGNOSIS — D631 Anemia in chronic kidney disease: Secondary | ICD-10-CM | POA: Diagnosis not present

## 2021-07-30 DIAGNOSIS — I2510 Atherosclerotic heart disease of native coronary artery without angina pectoris: Secondary | ICD-10-CM | POA: Insufficient documentation

## 2021-07-30 DIAGNOSIS — Z87891 Personal history of nicotine dependence: Secondary | ICD-10-CM | POA: Diagnosis not present

## 2021-07-30 DIAGNOSIS — I252 Old myocardial infarction: Secondary | ICD-10-CM | POA: Insufficient documentation

## 2021-07-30 DIAGNOSIS — N186 End stage renal disease: Secondary | ICD-10-CM | POA: Insufficient documentation

## 2021-07-30 HISTORY — DX: End stage renal disease: N18.6

## 2021-07-30 HISTORY — PX: AV FISTULA PLACEMENT: SHX1204

## 2021-07-30 HISTORY — DX: Peripheral vascular disease, unspecified: I73.9

## 2021-07-30 HISTORY — DX: Atherosclerosis of aorta: I70.0

## 2021-07-30 HISTORY — DX: Malignant neoplasm of bladder, unspecified: C67.9

## 2021-07-30 HISTORY — DX: Disorder of arteries and arterioles, unspecified: I77.9

## 2021-07-30 HISTORY — DX: Hyperlipidemia, unspecified: E78.5

## 2021-07-30 LAB — POCT I-STAT, CHEM 8
BUN: 81 mg/dL — ABNORMAL HIGH (ref 8–23)
BUN: 77 mg/dL — ABNORMAL HIGH (ref 8–23)
Calcium, Ion: 0.75 mmol/L — CL (ref 1.15–1.40)
Calcium, Ion: 0.93 mmol/L — ABNORMAL LOW (ref 1.15–1.40)
Chloride: 110 mmol/L (ref 98–111)
Chloride: 107 mmol/L (ref 98–111)
Creatinine, Ser: 7.8 mg/dL — ABNORMAL HIGH (ref 0.61–1.24)
Creatinine, Ser: 7.6 mg/dL — ABNORMAL HIGH (ref 0.61–1.24)
Glucose, Bld: 78 mg/dL (ref 70–99)
Glucose, Bld: 79 mg/dL (ref 70–99)
HCT: 39 % (ref 39.0–52.0)
HCT: 36 % — ABNORMAL LOW (ref 39.0–52.0)
Hemoglobin: 13.3 g/dL (ref 13.0–17.0)
Hemoglobin: 12.2 g/dL — ABNORMAL LOW (ref 13.0–17.0)
Potassium: 5.2 mmol/L — ABNORMAL HIGH (ref 3.5–5.1)
Potassium: 4.6 mmol/L (ref 3.5–5.1)
Sodium: 141 mmol/L (ref 135–145)
Sodium: 143 mmol/L (ref 135–145)
TCO2: 21 mmol/L — ABNORMAL LOW (ref 22–32)
TCO2: 23 mmol/L (ref 22–32)

## 2021-07-30 SURGERY — INSERTION OF ARTERIOVENOUS (AV) GORE-TEX GRAFT ARM
Anesthesia: General | Laterality: Left

## 2021-07-30 MED ORDER — IPRATROPIUM-ALBUTEROL 0.5-2.5 (3) MG/3ML IN SOLN
3.0000 mL | Freq: Once | RESPIRATORY_TRACT | Status: AC
  Administered 2021-07-30: 3 mL via RESPIRATORY_TRACT

## 2021-07-30 MED ORDER — STERILE WATER FOR IRRIGATION IR SOLN
Status: DC | PRN
  Administered 2021-07-30: 1000 mL

## 2021-07-30 MED ORDER — MIDAZOLAM HCL 2 MG/2ML IJ SOLN
INTRAMUSCULAR | Status: DC | PRN
  Administered 2021-07-30: 2 mg via INTRAVENOUS

## 2021-07-30 MED ORDER — MIDAZOLAM HCL 2 MG/2ML IJ SOLN
INTRAMUSCULAR | Status: AC
  Filled 2021-07-30: qty 2

## 2021-07-30 MED ORDER — IPRATROPIUM-ALBUTEROL 0.5-2.5 (3) MG/3ML IN SOLN
RESPIRATORY_TRACT | Status: AC
  Filled 2021-07-30: qty 3

## 2021-07-30 MED ORDER — PROPOFOL 10 MG/ML IV BOLUS
INTRAVENOUS | Status: AC
  Filled 2021-07-30: qty 20

## 2021-07-30 MED ORDER — DEXAMETHASONE SODIUM PHOSPHATE 10 MG/ML IJ SOLN
INTRAMUSCULAR | Status: AC
  Filled 2021-07-30: qty 1

## 2021-07-30 MED ORDER — FENTANYL CITRATE (PF) 100 MCG/2ML IJ SOLN: 25.0000 ug | INTRAMUSCULAR | Status: DC | PRN

## 2021-07-30 MED ORDER — DEXAMETHASONE SODIUM PHOSPHATE 10 MG/ML IJ SOLN
INTRAMUSCULAR | Status: DC | PRN
  Administered 2021-07-30: 5 mg via INTRAVENOUS

## 2021-07-30 MED ORDER — KETAMINE HCL 10 MG/ML IJ SOLN
INTRAMUSCULAR | Status: DC | PRN
  Administered 2021-07-30: 20 mg via INTRAVENOUS
  Administered 2021-07-30 (×3): 10 mg via INTRAVENOUS

## 2021-07-30 MED ORDER — LIDOCAINE HCL (PF) 2 % IJ SOLN
INTRAMUSCULAR | Status: AC
  Filled 2021-07-30: qty 5

## 2021-07-30 MED ORDER — FENTANYL CITRATE (PF) 100 MCG/2ML IJ SOLN
INTRAMUSCULAR | Status: DC | PRN
Start: 2021-07-30 — End: 2021-07-30
  Administered 2021-07-30: 25 ug via INTRAVENOUS
  Administered 2021-07-30: 50 ug via INTRAVENOUS
  Administered 2021-07-30: 25 ug via INTRAVENOUS

## 2021-07-30 MED ORDER — PHENYLEPHRINE HCL-NACL 20-0.9 MG/250ML-% IV SOLN
INTRAVENOUS | Status: DC | PRN
  Administered 2021-07-30: 50 ug/min via INTRAVENOUS

## 2021-07-30 MED ORDER — HYDROCODONE-ACETAMINOPHEN 5-325 MG PO TABS: 2.0000 | ORAL_TABLET | Freq: Four times a day (QID) | ORAL | 0 refills | Status: DC | PRN

## 2021-07-30 MED ORDER — FAMOTIDINE 20 MG PO TABS
ORAL_TABLET | ORAL | Status: AC
  Administered 2021-07-30: 20 mg via ORAL
  Filled 2021-07-30: qty 1

## 2021-07-30 MED ORDER — PROPOFOL 10 MG/ML IV BOLUS
INTRAVENOUS | Status: DC | PRN
  Administered 2021-07-30: 80 mg via INTRAVENOUS

## 2021-07-30 MED ORDER — HEPARIN SODIUM (PORCINE) 1000 UNIT/ML IJ SOLN
INTRAMUSCULAR | Status: DC | PRN
  Administered 2021-07-30: 3000 [IU] via INTRAVENOUS

## 2021-07-30 MED ORDER — KETAMINE HCL 50 MG/5ML IJ SOSY
PREFILLED_SYRINGE | INTRAMUSCULAR | Status: AC
  Filled 2021-07-30: qty 5

## 2021-07-30 MED ORDER — LABETALOL HCL 5 MG/ML IV SOLN
5.0000 mg | INTRAVENOUS | Status: AC
  Administered 2021-07-30 (×2): 5 mg via INTRAVENOUS
  Filled 2021-07-30: qty 4

## 2021-07-30 MED ORDER — PHENYLEPHRINE 80 MCG/ML (10ML) SYRINGE FOR IV PUSH (FOR BLOOD PRESSURE SUPPORT)
PREFILLED_SYRINGE | INTRAVENOUS | Status: AC
  Filled 2021-07-30: qty 10

## 2021-07-30 MED ORDER — GLYCOPYRROLATE 0.2 MG/ML IJ SOLN
INTRAMUSCULAR | Status: DC | PRN
  Administered 2021-07-30: .1 mg via INTRAVENOUS

## 2021-07-30 MED ORDER — LIDOCAINE HCL (CARDIAC) PF 100 MG/5ML IV SOSY
PREFILLED_SYRINGE | INTRAVENOUS | Status: DC | PRN
  Administered 2021-07-30: 40 mg via INTRAVENOUS

## 2021-07-30 MED ORDER — HYDRALAZINE HCL 20 MG/ML IJ SOLN
INTRAMUSCULAR | Status: AC
  Filled 2021-07-30: qty 1

## 2021-07-30 MED ORDER — HYDRALAZINE HCL 20 MG/ML IJ SOLN
10.0000 mg | Freq: Once | INTRAMUSCULAR | Status: AC
  Administered 2021-07-30: 10 mg via INTRAVENOUS

## 2021-07-30 MED ORDER — ONDANSETRON HCL 4 MG/2ML IJ SOLN
INTRAMUSCULAR | Status: DC | PRN
  Administered 2021-07-30: 4 mg via INTRAVENOUS

## 2021-07-30 MED ORDER — PHENYLEPHRINE HCL-NACL 20-0.9 MG/250ML-% IV SOLN
INTRAVENOUS | Status: AC
  Filled 2021-07-30: qty 250

## 2021-07-30 MED ORDER — LABETALOL HCL 5 MG/ML IV SOLN: 0.5000 mg/min | Status: DC

## 2021-07-30 MED ORDER — CEFAZOLIN SODIUM-DEXTROSE 2-4 GM/100ML-% IV SOLN
INTRAVENOUS | Status: AC
  Filled 2021-07-30: qty 100

## 2021-07-30 MED ORDER — SODIUM CHLORIDE 0.9 % IV SOLN: INTRAVENOUS | Status: DC | PRN

## 2021-07-30 MED ORDER — PHENYLEPHRINE 80 MCG/ML (10ML) SYRINGE FOR IV PUSH (FOR BLOOD PRESSURE SUPPORT)
PREFILLED_SYRINGE | INTRAVENOUS | Status: DC | PRN
  Administered 2021-07-30 (×2): 80 ug via INTRAVENOUS
  Administered 2021-07-30 (×2): 160 ug via INTRAVENOUS

## 2021-07-30 MED ORDER — HEMOSTATIC AGENTS (NO CHARGE) OPTIME
TOPICAL | Status: DC | PRN
  Administered 2021-07-30: 1 via TOPICAL

## 2021-07-30 MED ORDER — BUPIVACAINE-EPINEPHRINE (PF) 0.5% -1:200000 IJ SOLN
INTRAMUSCULAR | Status: AC
  Filled 2021-07-30: qty 30

## 2021-07-30 MED ORDER — LABETALOL HCL 5 MG/ML IV SOLN
10.0000 mg | Freq: Once | INTRAVENOUS | Status: AC
  Filled 2021-07-30: qty 4

## 2021-07-30 MED ORDER — EPHEDRINE SULFATE (PRESSORS) 50 MG/ML IJ SOLN
INTRAMUSCULAR | Status: DC | PRN
  Administered 2021-07-30 (×2): 10 mg via INTRAVENOUS

## 2021-07-30 MED ORDER — HEPARIN SODIUM (PORCINE) 1000 UNIT/ML IJ SOLN
INTRAMUSCULAR | Status: AC
  Filled 2021-07-30: qty 10

## 2021-07-30 MED ORDER — "VISTASEAL 4 ML SINGLE DOSE KIT "
PACK | CUTANEOUS | Status: DC | PRN
  Administered 2021-07-30: 4 mL via TOPICAL

## 2021-07-30 MED ORDER — HEPARIN 5000 UNITS IN NS 1000 ML (FLUSH)
INTRAMUSCULAR | Status: DC | PRN
  Administered 2021-07-30: 1000 mL via INTRAMUSCULAR

## 2021-07-30 MED ORDER — ONDANSETRON HCL 4 MG/2ML IJ SOLN: 4.0000 mg | Freq: Once | INTRAMUSCULAR | Status: DC | PRN

## 2021-07-30 MED ORDER — LABETALOL HCL 5 MG/ML IV SOLN
INTRAVENOUS | Status: AC
  Administered 2021-07-30: 10 mg via INTRAVENOUS
  Filled 2021-07-30: qty 4

## 2021-07-30 MED ORDER — HEPARIN SODIUM (PORCINE) 5000 UNIT/ML IJ SOLN
INTRAMUSCULAR | Status: AC
  Filled 2021-07-30: qty 1

## 2021-07-30 MED ORDER — EPHEDRINE 5 MG/ML INJ
INTRAVENOUS | Status: AC
  Filled 2021-07-30: qty 5

## 2021-07-30 MED ORDER — FENTANYL CITRATE (PF) 100 MCG/2ML IJ SOLN
INTRAMUSCULAR | Status: AC
  Filled 2021-07-30: qty 2

## 2021-07-30 MED ORDER — LABETALOL HCL 20 MG/4ML IV SOSY
5.0000 mg | PREFILLED_SYRINGE | INTRAVENOUS | Status: AC
  Administered 2021-07-30: 5 mg via INTRAVENOUS

## 2021-07-30 MED ORDER — LABETALOL HCL 5 MG/ML IV SOLN
INTRAVENOUS | Status: AC
  Filled 2021-07-30: qty 4

## 2021-07-30 MED ORDER — GLYCOPYRROLATE 0.2 MG/ML IJ SOLN
INTRAMUSCULAR | Status: AC
  Filled 2021-07-30: qty 1

## 2021-07-30 MED ORDER — CHLORHEXIDINE GLUCONATE 0.12 % MT SOLN
OROMUCOSAL | Status: AC
  Administered 2021-07-30: 15 mL via OROMUCOSAL
  Filled 2021-07-30: qty 15

## 2021-07-30 MED ORDER — ONDANSETRON HCL 4 MG/2ML IJ SOLN
INTRAMUSCULAR | Status: AC
  Filled 2021-07-30: qty 2

## 2021-07-30 SURGICAL SUPPLY — 60 items
ADH SKN CLS APL DERMABOND .7 (GAUZE/BANDAGES/DRESSINGS) ×1
APL PRP STRL LF DISP 70% ISPRP (MISCELLANEOUS) ×1
BAG DECANTER FOR FLEXI CONT (MISCELLANEOUS) ×2 IMPLANT
BLADE SURG SZ11 CARB STEEL (BLADE) ×2 IMPLANT
BOOT SUTURE AID YELLOW STND (SUTURE) ×2 IMPLANT
BRUSH SCRUB EZ  4% CHG (MISCELLANEOUS) ×1
BRUSH SCRUB EZ 4% CHG (MISCELLANEOUS) ×1 IMPLANT
CHLORAPREP W/TINT 26 (MISCELLANEOUS) ×2 IMPLANT
CLIP SPRNG 6 S-JAW DBL (CLIP) ×1 IMPLANT
CLIP SPRNG 6MM S-JAW DBL (CLIP) ×2 IMPLANT
DERMABOND ADVANCED (GAUZE/BANDAGES/DRESSINGS) ×1
DERMABOND ADVANCED .7 DNX12 (GAUZE/BANDAGES/DRESSINGS) ×1 IMPLANT
ELECT CAUTERY BLADE 6.4 (BLADE) ×2 IMPLANT
ELECT REM PT RETURN 9FT ADLT (ELECTROSURGICAL) ×2 IMPLANT
ELECTRODE REM PT RTRN 9FT ADLT (ELECTROSURGICAL) ×1 IMPLANT
GLOVE BIO SURGEON STRL SZ7 (GLOVE) ×2 IMPLANT
GOWN STRL REUS W/ TWL LRG LVL3 (GOWN DISPOSABLE) ×1 IMPLANT
GOWN STRL REUS W/ TWL XL LVL3 (GOWN DISPOSABLE) ×1 IMPLANT
GOWN STRL REUS W/TWL LRG LVL3 (GOWN DISPOSABLE) ×2
GOWN STRL REUS W/TWL XL LVL3 (GOWN DISPOSABLE) ×2
GRAFT PROPATEN STD WALL 6X40 (Vascular Products) ×1 IMPLANT
HEMOSTAT SURGICEL 2X3 (HEMOSTASIS) ×2 IMPLANT
IV NS 1000ML (IV SOLUTION) ×2
IV NS 1000ML BAXH (IV SOLUTION) IMPLANT
IV NS 500ML (IV SOLUTION)
IV NS 500ML BAXH (IV SOLUTION) ×1 IMPLANT
KIT TURNOVER KIT A (KITS) ×2 IMPLANT
LABEL OR SOLS (LABEL) ×2 IMPLANT
LOOP RED MAXI  1X406MM (MISCELLANEOUS) ×1
LOOP VESSEL MAXI 1X406 RED (MISCELLANEOUS) ×1 IMPLANT
LOOP VESSEL MINI 0.8X406 BLUE (MISCELLANEOUS) ×1 IMPLANT
LOOPS BLUE MINI 0.8X406MM (MISCELLANEOUS) ×1
MANIFOLD NEPTUNE II (INSTRUMENTS) ×2 IMPLANT
NDL FILTER BLUNT 18X1 1/2 (NEEDLE) ×1 IMPLANT
NEEDLE FILTER BLUNT 18X 1/2SAF (NEEDLE) ×1
NEEDLE FILTER BLUNT 18X1 1/2 (NEEDLE) ×1 IMPLANT
NS IRRIG 500ML POUR BTL (IV SOLUTION) ×2 IMPLANT
PACK EXTREMITY ARMC (MISCELLANEOUS) ×2 IMPLANT
PAD PREP 24X41 OB/GYN DISP (PERSONAL CARE ITEMS) ×2 IMPLANT
SOLUTION CELL SAVER (CLIP) ×1 IMPLANT
SPIKE FLUID TRANSFER (MISCELLANEOUS) ×2 IMPLANT
STOCKINETTE 48X4 2 PLY STRL (GAUZE/BANDAGES/DRESSINGS) ×1 IMPLANT
STOCKINETTE STRL 4IN 9604848 (GAUZE/BANDAGES/DRESSINGS) ×2 IMPLANT
SUT GORETEX 6.0 TT9 (SUTURE) ×3 IMPLANT
SUT MNCRL AB 4-0 PS2 18 (SUTURE) ×2 IMPLANT
SUT PROLENE 6 0 BV (SUTURE) ×7 IMPLANT
SUT SILK 0 SH 30 (SUTURE) ×1 IMPLANT
SUT SILK 2 0 (SUTURE) ×2
SUT SILK 2-0 18XBRD TIE 12 (SUTURE) ×1 IMPLANT
SUT SILK 3 0 (SUTURE)
SUT SILK 3-0 (SUTURE) ×1 IMPLANT
SUT SILK 3-0 18XBRD TIE 12 (SUTURE) ×1 IMPLANT
SUT SILK 4 0 (SUTURE)
SUT SILK 4-0 18XBRD TIE 12 (SUTURE) ×1 IMPLANT
SUT VIC AB 3-0 SH 27 (SUTURE) ×2
SUT VIC AB 3-0 SH 27X BRD (SUTURE) ×1 IMPLANT
SYR 20ML LL LF (SYRINGE) ×2 IMPLANT
SYR 3ML LL SCALE MARK (SYRINGE) ×2 IMPLANT
WATER STERILE IRR 1000ML POUR (IV SOLUTION) ×1 IMPLANT
WATER STERILE IRR 500ML POUR (IV SOLUTION) ×2 IMPLANT

## 2021-07-30 NOTE — Interval H&P Note (Signed)
History and Physical Interval Note:  07/30/2021 1:34 PM  Anthony Hess  has presented today for surgery, with the diagnosis of ESRD.  The various methods of treatment have been discussed with the patient and family. After consideration of risks, benefits and other options for treatment, the patient has consented to  Procedure(s): INSERTION OF ARTERIOVENOUS (AV) GORE-TEX GRAFT ARM (Left) as a surgical intervention.  The patient's history has been reviewed, patient examined, no change in status, stable for surgery.  I have reviewed the patient's chart and labs.  Questions were answered to the patient's satisfaction.     [name]

## 2021-07-30 NOTE — Anesthesia Procedure Notes (Signed)
Procedure Name: LMA Insertion Date/Time: 07/30/2021 1:59 PM  Performed by: Cammie Sickle, CRNAPre-anesthesia Checklist: Patient identified, Patient being monitored, Timeout performed, Emergency Drugs available and Suction available Patient Re-evaluated:Patient Re-evaluated prior to induction Oxygen Delivery Method: Circle system utilized Preoxygenation: Pre-oxygenation with 100% oxygen Induction Type: IV induction Ventilation: Mask ventilation without difficulty LMA: LMA inserted LMA Size: 4.0 Tube type: Oral Number of attempts: 1 Placement Confirmation: positive ETCO2 and breath sounds checked- equal and bilateral Tube secured with: Tape Dental Injury: Teeth and Oropharynx as per pre-operative assessment

## 2021-07-30 NOTE — Transfer of Care (Signed)
Immediate Anesthesia Transfer of Care Note  Patient: Anthony Hess  Procedure(s) Performed: INSERTION OF ARTERIOVENOUS (AV) GORE-TEX GRAFT ARM BRACHIAL ARTERY TO AXILLARY VEIN (Left)  Patient Location: PACU  Anesthesia Type:General  Level of Consciousness: drowsy  Airway & Oxygen Therapy: Patient Spontanous Breathing and Patient connected to face mask oxygen  Post-op Assessment: Report given to RN and Post -op Vital signs reviewed and stable  Post vital signs: Reviewed and stable  Last Vitals:  Vitals Value Taken Time  BP 166/102 07/30/21 1605  Temp 36.2 C 07/30/21 1603  Pulse 88 07/30/21 1607  Resp 21 07/30/21 1607  SpO2 96 % 07/30/21 1607  Vitals shown include unvalidated device data.  Last Pain:  Vitals:   07/30/21 1122  TempSrc: Temporal  PainSc: 0-No pain         Complications: No notable events documented.

## 2021-07-30 NOTE — Op Note (Signed)
[facility]  OPERATIVE NOTE   PROCEDURE:  Left upper arm brachial artery to axillary vein arteriovenous graft  PRE-OPERATIVE DIAGNOSIS: 1. end stage renal disease    POST-OPERATIVE DIAGNOSIS: same  SURGEON: Kamarian Sahakian  ASSISTANT(S): none  ANESTHESIA: general  ESTIMATED BLOOD LOSS: 50 cc  FINDING(S): Small brachial artery and axillary veins  SPECIMEN(S):  None  INDICATIONS:   Anthony Hess is a 66 y.o. male who presents with end stage renal disease and need for permanent access.  Risk, benefits, and alternatives to access surgery were discussed.  The patient is aware the risks include but are not limited to: bleeding, infection, steal syndrome, nerve damage, ischemic monomelic neuropathy, failure to mature, and need for additional procedures.  The patient is aware of the risks and elects to proceed forward.  DESCRIPTION: After full informed written consent was obtained from the patient, the patient was brought back to the operating room and placed supine upon the operating table.  The patient was given IV antibiotics prior to proceeding.  After obtaining adequate sedation, the patient was prepped and draped in standard fashion for a left arm access procedure.  I turned my attention first to the antecubitum.   I made an incision over the brachial artery, and dissected down through the subcutaneous tissue to the fascia carefully and was able to dissect out the brachial artery.  The artery was about patent but small.  It was controlled proximally and distally with vessel loops and then I turned my attention to the high bicipital groove in the axilla.  I made an incision and dissected down through the subcutaneous tissue and fascia until I reached the axillary vein.  It was patent but small and our only reasonable option for graft creation.  I then dissected this vein proximally and distal and prepared it for control with Bulldog clamps.  I took a Dietitian  and dissected from the antecubital up to the axillary incision.  Then I delivered the 6 mm Propaten pTFE graft, through this metal tunneler and then pulled out the metal tunneler leaving the graft in place and making sure the line was up for orientation.  I then gave the patient 3000 units of heparin to gain some anticoagulation.  After waiting 3 minutes, I placed the brachial artery under tension proximally and distally with vessel loops, made an arteriotomy and extended it with a Potts scissor.  I sewed the graft to this arteriotomy with a running stitch of CV-6 suture.  At this point, then I completed the anastomosis in the usual fashion.  I released the vessel loops on the inflow and allowed the artery to decompress through the graft. There was good pulsatile bleeding through this graft.  I clamped the graft near its arterial anastomosis and sucked out all the blood in the graft and loaded the graft with heparinized saline.  At this point, I pulled the graft to appropriate length and reset my exposure of the axillary vein.  Then, I controlled the vein with Bulldog clamps.  An anterior wall venotomy was created with an 11 blade and extended with Potts scissors.  I then spatulated the graft to facilitate an end-to- side anastomosis matching the arteriotomy.  In the process of spatulating, I cut the graft to appropriate length for this anastomosis.  This graft was sewn to the vein in an end-to-side configuration with a running CV-6 suture.  Prior to completing this anastomosis, I allowed the vein to back bleed and  then I also allowed the artery to bleed in an antegrade fashion.  I completed this anastomosis in the usual fashion, and then irrigated out the high bicipital exposure and then placed Surgicel and Evicel.  I then turned my attention back to the antecubitum.  There was pulsatile flow in the artery beyond the graft.   At this point, I washed out the antecubital incision. Surgicel and Evicel were then  placed. There was no more active bleeding.  The subcutaneous tissue was reapproximated with a running stitch of 3-0 Vicryl.  The skin was then reapproximated with a running subcuticular 4-0 Monocryl.  The skin was then cleaned, dried, and Dermabond used to reinforce the skin closure.  We then turned our attention to the axillary incision.  The subcutaneous tissue was repaired with running stitch of 3-0 Vicryl.  The skin was then reapproximated with running subcuticular 4-0 Monocryl.  The skin was then cleaned, dried, and then the skin closure was reinforced with Dermabond.  The patient was then awakened from anesthesia and taken to the recovery room in stable condition having tolerated the procedure well.    COMPLICATIONS: None  CONDITION: Stable   [name] 07/30/2021 4:04 PM   This note was created with Dragon Medical transcription system. Any errors in dictation are purely unintentional.

## 2021-07-30 NOTE — Anesthesia Preprocedure Evaluation (Signed)
Anesthesia Evaluation  Patient identified by MRN, date of birth, ID band Patient awake    Reviewed: Allergy & Precautions, H&P , NPO status , Patient's Chart, lab work & pertinent test results, reviewed documented beta blocker date and time   Airway Mallampati: II  TM Distance: >3 FB Neck ROM: full    Dental  (+) Teeth Intact   Pulmonary COPD, former smoker,    Pulmonary exam normal        Cardiovascular Exercise Tolerance: Poor hypertension, On Medications + CAD, + Past MI and + Peripheral Vascular Disease  Normal cardiovascular exam Rate:Normal     Neuro/Psych negative neurological ROS  negative psych ROS   GI/Hepatic negative GI ROS, Neg liver ROS,   Endo/Other  negative endocrine ROS  Renal/GU Dialysis and ESRFRenal disease  negative genitourinary   Musculoskeletal   Abdominal   Peds  Hematology  (+) Blood dyscrasia, anemia ,   Anesthesia Other Findings   Reproductive/Obstetrics negative OB ROS                             Anesthesia Physical Anesthesia Plan  ASA: 4  Anesthesia Plan: General LMA   Post-op Pain Management:    Induction:   PONV Risk Score and Plan: 3  Airway Management Planned:   Additional Equipment:   Intra-op Plan:   Post-operative Plan:   Informed Consent: I have reviewed the patients History and Physical, chart, labs and discussed the procedure including the risks, benefits and alternatives for the proposed anesthesia with the patient or authorized representative who has indicated his/her understanding and acceptance.       Plan Discussed with: CRNA  Anesthesia Plan Comments:         Anesthesia Quick Evaluation

## 2021-07-30 NOTE — Discharge Instructions (Signed)
AMBULATORY SURGERY  ?DISCHARGE INSTRUCTIONS ? ? ?The drugs that you were given will stay in your system until tomorrow so for the next 24 hours you should not: ? ?Drive an automobile ?Make any legal decisions ?Drink any alcoholic beverage ? ? ?You may resume regular meals tomorrow.  Today it is better to start with liquids and gradually work up to solid foods. ? ?You may eat anything you prefer, but it is better to start with liquids, then soup and crackers, and gradually work up to solid foods. ? ? ?Please notify your doctor immediately if you have any unusual bleeding, trouble breathing, redness and pain at the surgery site, drainage, fever, or pain not relieved by medication. ? ? ? ?Additional Instructions: ? ? ? ?Please contact your physician with any problems or Same Day Surgery at [phone], Monday through Friday 6 am to 4 pm, or [place] at [place] [facility] number at [phone].  ?

## 2021-07-30 NOTE — Anesthesia Postprocedure Evaluation (Signed)
Anesthesia Post Note  Patient: Anthony Hess  Procedure(s) Performed: INSERTION OF ARTERIOVENOUS (AV) GORE-TEX GRAFT ARM BRACHIAL ARTERY TO AXILLARY VEIN (Left)  Patient location during evaluation: PACU Anesthesia Type: General Level of consciousness: awake and alert, oriented and patient cooperative Pain management: pain level controlled Vital Signs Assessment: post-procedure vital signs reviewed and stable Respiratory status: spontaneous breathing, nonlabored ventilation and respiratory function stable Cardiovascular status: blood pressure returned to baseline and stable Postop Assessment: adequate PO intake Anesthetic complications: no   No notable events documented.   Last Vitals:  Vitals:   07/30/21 1645 07/30/21 1653  BP: (!) 144/100 112/84  Pulse: 87 87  Resp: 17 18  Temp:    SpO2: 96% 95%    Last Pain:  Vitals:   07/30/21 1630  TempSrc:   PainSc: 0-No pain                 Darrin Nipper

## 2021-07-30 NOTE — OR Nursing (Signed)
After 25 mg of labetalol patient's HR dropped to 58. Dr. Baruch Merl notified. He has ordered patient to get IV hydraulazine and to stop further doses of labetalol.

## 2021-07-30 NOTE — OR Nursing (Signed)
BP high as documented. Dr. Baruch Merl notified and labetalol given as ordered. After 20 mg of labetalol Dr. Baruch Merl in to see patient. Additional labetalol ordered and being given.

## 2021-08-03 ENCOUNTER — Encounter: Payer: Self-pay | Admitting: Vascular Surgery

## 2021-08-03 ENCOUNTER — Telehealth (INDEPENDENT_AMBULATORY_CARE_PROVIDER_SITE_OTHER): Payer: Self-pay | Admitting: Nurse Practitioner

## 2021-08-03 ENCOUNTER — Other Ambulatory Visit (INDEPENDENT_AMBULATORY_CARE_PROVIDER_SITE_OTHER): Payer: Self-pay | Admitting: Nurse Practitioner

## 2021-08-03 NOTE — Telephone Encounter (Signed)
Patient called in wanting something else sent in foe his pain. States that what was prescribed is no working   Talked with NP Eulogio Ditch and she agreed to send patient another Rx to pharmacy. Pharmacy confirmed and patient was ok with this

## 2021-08-04 ENCOUNTER — Other Ambulatory Visit (INDEPENDENT_AMBULATORY_CARE_PROVIDER_SITE_OTHER): Payer: Self-pay | Admitting: Nurse Practitioner

## 2021-08-04 MED ORDER — OXYCODONE-ACETAMINOPHEN 5-325 MG PO TABS: 1.0000 | ORAL_TABLET | ORAL | 0 refills | Status: DC | PRN

## 2021-08-12 ENCOUNTER — Telehealth (INDEPENDENT_AMBULATORY_CARE_PROVIDER_SITE_OTHER): Payer: Self-pay | Admitting: Vascular Surgery

## 2021-08-12 NOTE — Telephone Encounter (Signed)
This is the first we are hearing of this because the last call we received he wanted more pain medication.  We can get him in to look at the wound tomorrown

## 2021-08-12 NOTE — Telephone Encounter (Signed)
Patients wife called in upset that patients arm is swollen and he can not use his ar. Also says its puss coming out of it. She is wanting to speak with a provider ASAP. States if she has to take him out of the state to find out what's wrong with his arm she will. But she expects a call back today    Please call and advise

## 2021-08-12 NOTE — Telephone Encounter (Signed)
Patients wife called in upset that patients arm is swollen and he can not use his arm. Also says its puss coming out of it. She is wanting to speak with a provider ASAP. States if she has to take him out of the state to find out what's wrong with his arm she will. But she expects a call back today

## 2021-08-13 ENCOUNTER — Ambulatory Visit (INDEPENDENT_AMBULATORY_CARE_PROVIDER_SITE_OTHER): Payer: Medicare Other | Admitting: Nurse Practitioner

## 2021-08-13 ENCOUNTER — Encounter (INDEPENDENT_AMBULATORY_CARE_PROVIDER_SITE_OTHER): Payer: Self-pay | Admitting: Nurse Practitioner

## 2021-08-13 VITALS — BP 173/92 | HR 69 | Resp 16 | Wt 127.4 lb

## 2021-08-13 DIAGNOSIS — N186 End stage renal disease: Secondary | ICD-10-CM

## 2021-08-13 DIAGNOSIS — I10 Essential (primary) hypertension: Secondary | ICD-10-CM

## 2021-08-13 MED ORDER — OXYCODONE HCL 5 MG PO TABS: 5.0000 mg | ORAL_TABLET | Freq: Four times a day (QID) | ORAL | 0 refills | Status: DC | PRN

## 2021-08-13 MED ORDER — DOXYCYCLINE HYCLATE 100 MG PO CAPS: 100.0000 mg | ORAL_CAPSULE | Freq: Two times a day (BID) | ORAL | 0 refills | Status: DC

## 2021-08-14 ENCOUNTER — Telehealth (INDEPENDENT_AMBULATORY_CARE_PROVIDER_SITE_OTHER): Payer: Self-pay

## 2021-08-14 ENCOUNTER — Other Ambulatory Visit (INDEPENDENT_AMBULATORY_CARE_PROVIDER_SITE_OTHER): Payer: Self-pay | Admitting: Nurse Practitioner

## 2021-08-14 ENCOUNTER — Telehealth (INDEPENDENT_AMBULATORY_CARE_PROVIDER_SITE_OTHER): Payer: Self-pay | Admitting: Nurse Practitioner

## 2021-08-14 MED ORDER — DOXYCYCLINE HYCLATE 100 MG PO CAPS: 100.0000 mg | ORAL_CAPSULE | Freq: Two times a day (BID) | ORAL | 0 refills | Status: DC

## 2021-08-14 NOTE — Telephone Encounter (Signed)
Pt called and LVM stating that the Rx prescribed from [facility] was not at [facility] on [address].  I called to make pt aware that the Rx has been sent to that pharmacy today. Pt's wife answers and states that the dialysis center needs to orders for antibiotics through IV. Also Aurther Loft at [facility] dialysis called and LVM stating that there were no orders that Dr Lorenda Hatchet office could see so could we please place orders or call and let someone know what pt should be receiving for the infection at fistula site. Aurther Loft can be reached at [phone]. Please advise.

## 2021-08-14 NOTE — Telephone Encounter (Signed)
Patient called and LVM stating the prescription is not at the pharmacy and he needs that today.  Please advise.

## 2021-08-17 ENCOUNTER — Ambulatory Visit (INDEPENDENT_AMBULATORY_CARE_PROVIDER_SITE_OTHER): Payer: Medicare Other | Admitting: Nurse Practitioner

## 2021-08-20 ENCOUNTER — Encounter (INDEPENDENT_AMBULATORY_CARE_PROVIDER_SITE_OTHER): Payer: Self-pay | Admitting: Nurse Practitioner

## 2021-08-20 ENCOUNTER — Ambulatory Visit (INDEPENDENT_AMBULATORY_CARE_PROVIDER_SITE_OTHER): Payer: Medicare Other | Admitting: Nurse Practitioner

## 2021-08-20 VITALS — BP 119/74 | HR 74 | Resp 16 | Wt 123.6 lb

## 2021-08-20 DIAGNOSIS — N186 End stage renal disease: Secondary | ICD-10-CM

## 2021-08-21 ENCOUNTER — Telehealth (INDEPENDENT_AMBULATORY_CARE_PROVIDER_SITE_OTHER): Payer: Self-pay

## 2021-08-21 NOTE — Telephone Encounter (Signed)
Patient called this morning asking about his surgery with Dr. Delana Meyer. Patient was seen 08/20/21 by Eulogio Ditch NP. Patient wants to speak with Dr. Delana Meyer before having surgery, I advised he make an appt with Dr. Delana Meyer to discuss surgery and patient stated he would do so.

## 2021-08-25 ENCOUNTER — Encounter (INDEPENDENT_AMBULATORY_CARE_PROVIDER_SITE_OTHER): Payer: Self-pay | Admitting: Nurse Practitioner

## 2021-08-25 NOTE — Progress Notes (Signed)
Subjective:    Patient ID: Anthony Hess, male    DOB: [birth year], 66 y.o.   MRN: [id number] Chief Complaint  Patient presents with   Follow-up    Wound check    Anthony Hess is a 66 year old male who presents today for follow-up evaluation of a left brachial axillary graft.  The patient was previously on peritoneal dialysis.  It was initially inserted by Dr. Adora Fridge in 2021 and replaced in 2022.  It was finally removed by Dr. Lucky Cowboy in January 2023.  The patient notes that his arm has been recently getting swollen and there has been some dehiscence of his wound.  There is also been significant drainage noted.  He also notes that the arm is sore as well.    Review of Systems  Skin:  Positive for color change.  All other systems reviewed and are negative.      Objective:   Physical Exam Vitals reviewed.  HENT:     Head: Normocephalic.  Cardiovascular:     Rate and Rhythm: Normal rate.  Pulmonary:     Effort: Pulmonary effort is normal.  Skin:    General: Skin is warm and dry.  Neurological:     Mental Status: He is alert and oriented to person, place, and time.  Psychiatric:        Mood and Affect: Mood normal.        Behavior: Behavior normal.        Thought Content: Thought content normal.        Judgment: Judgment normal.     BP (!) 173/92 (BP Location: Right Arm)   Pulse 69   Resp 16   Wt 127 lb 6.4 oz (57.8 kg)   BMI 22.57 kg/m   Past Medical History:  Diagnosis Date   Aortic atherosclerosis (HCC)    Bilateral carotid artery disease (HCC)    Bladder cancer ([place])    Coronary artery disease 12/20/2018   a.) LHC 12/20/2018: 50% OM1, 40% OM2, 95% o-pLAD, 75% o=pLCx, 40% mLM, 70% D1, 60% mRCA-1, 50% mRCA-2; refer to CVTS. b.) 4v CABG at [facility] on 12/27/2018: LIMA-LAD, RIMA-PDA, seg LRA-OM1-D1   DCM (dilated cardiomyopathy) ([place]) 12/05/2018   a.) TTE 12/05/2018: EF 40-45%. b.) TTE 12/28/2019: EF 20-25%.   ESRD (end stage renal disease) ([place])    a.) T-Th-Sat   HFrEF  (heart failure with reduced ejection fraction) ([place]) 12/05/2018   a.) TTE 12/05/2018: EF 40-45%; mild LVH; ant/apical/sep HK; mild TR . b.) TTE 12/28/2019: EF 20-25%; mod LVH; mod MR/AR; G1DD.   History of 2019 novel coronavirus disease (COVID-19) 04/08/2021   History of kidney stones    HLD (hyperlipidemia)    Hypertension    Infrarenal abdominal aortic aneurysm (AAA) without rupture ([place]) 03/05/2021   a.) CT abd/pelvis; measured 3.2 cm.   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   a.) LIMA-LAD, RIMA-PDA, sequential LEFT radial artery to OM1 and D1   Sepsis ([place]) 03/14/2021   Wears glasses     Social History   Socioeconomic History   Marital status: Married    Spouse name: Not on file   Number of children: Not on file   Years of education: Not on file   Highest education level: Not on file  Occupational History   Not on file  Tobacco Use   Smoking status: Former    Packs/day: 0.25    Types: Cigarettes  Quit date: 02/19/2019    Years since quitting: 2.5   Smokeless tobacco: Never  Vaping Use   Vaping Use: Never used  Substance and Sexual Activity   Alcohol use: Never   Drug use: Never   Sexual activity: Yes  Other Topics Concern   Not on file  Social History Narrative   Not on file   Social Determinants of Health   Financial Resource Strain: Not on file  Food Insecurity: Not on file  Transportation Needs: Not on file  Physical Activity: Not on file  Stress: Not on file  Social Connections: Not on file  Intimate Partner Violence: Not on file    Past Surgical History:  Procedure Laterality Date   AV FISTULA PLACEMENT Left 07/30/2021   Procedure: INSERTION OF ARTERIOVENOUS (AV) GORE-TEX GRAFT ARM BRACHIAL ARTERY TO AXILLARY VEIN;  Surgeon: Algernon Huxley, MD;  Location: [facility];  Service: Vascular;  Laterality: Left;   CAPD INSERTION N/A 12/31/2019   Procedure: LAPAROSCOPIC INSERTION CONTINUOUS AMBULATORY  PERITONEAL DIALYSIS  (CAPD) CATHETER;  Surgeon: Jules Husbands, MD;  Location: [facility];  Service: General;  Laterality: N/A;   CAPD REMOVAL N/A 04/10/2020   Procedure: LAPAROSCOPIC REVISION OF CONTINUOUS AMBULATORY PERITONEAL DIALYSIS  (CAPD) CATHETER;  Surgeon: Jules Husbands, MD;  Location: [facility];  Service: General;  Laterality: N/A;   CORONARY ARTERY BYPASS GRAFT N/A 12/27/2018   Procedure: CORONARY ARTERY BYPASS GRAFTING (CABG) X 4 ON PUMP USING RIGHT & LEFT INTERNAL MAMMARY ARTERY LEFT RADIAL ARTERY ENDOSCOPICALLY HARVESTED;  Surgeon: [name], MD;  Location: [place];  Service: Open Heart Surgery;  Laterality: N/A;   CYSTOSCOPY W/ RETROGRADES Bilateral 05/15/2019   Procedure: CYSTOSCOPY WITH RETROGRADE PYELOGRAM;  Surgeon: Abbie Sons, MD;  Location: [facility];  Service: Urology;  Laterality: Bilateral;   CYSTOSCOPY WITH BIOPSY N/A 05/15/2019   Procedure: CYSTOSCOPY WITH bladder BIOPSY;  Surgeon: Abbie Sons, MD;  Location: [facility];  Service: Urology;  Laterality: N/A;   DIALYSIS/PERMA CATHETER INSERTION N/A 12/28/2019   Procedure: DIALYSIS/PERMA CATHETER INSERTION;  Surgeon: Algernon Huxley, MD;  Location: [place] [facility];  Service: Cardiovascular;  Laterality: N/A;   DIALYSIS/PERMA CATHETER INSERTION N/A 03/18/2021   Procedure: DIALYSIS/PERMA CATHETER INSERTION;  Surgeon: Algernon Huxley, MD;  Location: [place] [facility];  Service: Cardiovascular;  Laterality: N/A;   DIALYSIS/PERMA CATHETER REMOVAL N/A 06/02/2020   Procedure: DIALYSIS/PERMA CATHETER REMOVAL;  Surgeon: Algernon Huxley, MD;  Location: [place] [facility];  Service: Cardiovascular;  Laterality: N/A;   EXCHANGE OF A DIALYSIS CATHETER Right 04/10/2020   Procedure: EXCHANGE OF A DIALYSIS CATHETER;  Surgeon: Jules Husbands, MD;  Location: [facility];  Service: General;  Laterality: Right;   [place]  01/20/2021   Procedure: HERNIA REPAIR INCISIONAL;  Surgeon: Olean Ree, MD;  Location: [facility];   Service: General;;   IR IMAGE GUIDED DRAINAGE PERCUT CATH  PERITONEAL RETROPERIT  04/07/2020   LAPAROSCOPY N/A 04/16/2021   Procedure: LAPAROSCOPY DIAGNOSTIC;  Surgeon: Benjamine Sprague, DO;  Location: [facility];  Service: General;  Laterality: N/A;   LEFT HEART CATH AND CORONARY ANGIOGRAPHY Left 12/20/2018   Procedure: LEFT HEART CATH AND CORONARY ANGIOGRAPHY;  Surgeon: Isaias Cowman, MD;  Location: [place] [facility];  Service: Cardiovascular;  Laterality: Left;   RADIAL ARTERY HARVEST Left 12/27/2018   Procedure: ENDOSCOPIC RADIAL ARTERY HARVEST;  Surgeon: [name], MD;  Location: [place];  Service: Open Heart Surgery;  Laterality: Left;  REMOVAL OF A DIALYSIS CATHETER Left 03/20/2021   Procedure: REMOVAL OF A PD CATHETER;  Surgeon: Algernon Huxley, MD;  Location: [facility];  Service: Vascular;  Laterality: Left;   TEE WITHOUT CARDIOVERSION N/A 12/27/2018   Procedure: TRANSESOPHAGEAL ECHOCARDIOGRAM (TEE);  Surgeon: [name], MD;  Location: [place];  Service: Open Heart Surgery;  Laterality: N/A;   TUMOR REMOVAL  2019   Bladder    Family History  Family history unknown: Yes    No Known Allergies     Latest Ref Rng & Units 07/30/2021   11:41 AM 07/30/2021   11:22 AM 07/27/2021    9:00 AM  CBC  WBC 4.0 - 10.5 K/uL   8.5   Hemoglobin 13.0 - 17.0 g/dL 12.2  13.3  12.5   Hematocrit 39.0 - 52.0 % 36.0  39.0  41.8   Platelets 150 - 400 K/uL   248       CMP     Component Value Date/Time   NA 143 07/30/2021 1141   NA 143 01/04/2020 1542   K 4.6 07/30/2021 1141   CL 107 07/30/2021 1141   CO2 22 07/27/2021 0900   GLUCOSE 79 07/30/2021 1141   BUN 77 (H) 07/30/2021 1141   BUN 52 (H) 01/04/2020 1542   CREATININE 7.60 (H) 07/30/2021 1141   CREATININE 4.62 (H) 01/10/2019 1351   CALCIUM 7.3 (L) 07/27/2021 0900   PROT 4.8 (L) 04/09/2021 0352   PROT 5.7 (L) 01/04/2020 1542   ALBUMIN 2.3 (L) 04/16/2021 0800   ALBUMIN 3.5 (L) 01/04/2020 1542   AST 31 04/09/2021 0352   ALT 47  (H) 04/09/2021 0352   ALKPHOS 49 04/09/2021 0352   BILITOT 0.5 04/09/2021 0352   BILITOT <0.2 01/04/2020 1542   GFRNONAA 8 (L) 07/27/2021 0900   GFRAA 15 (L) 01/04/2020 1542     No results found.     Assessment & Plan:   1. ESRD (end stage renal disease) ([employer]) The patient has evidence of wound dehiscence and obvious infection near the corner opening of his wound.  He also has significant drainage.  Due to the patient's and wife's concerns we will start the patient on doxycycline but we will also have the patient get IV vancomycin via dialysis.  We will have the patient return in 1 week to evaluate wound progression to discuss possible removal of graft.  2. Primary hypertension Continue antihypertensive medications as already ordered, these medications have been reviewed and there are no changes at this time.    Current Outpatient Medications on File Prior to Visit  Medication Sig Dispense Refill   acetaminophen (TYLENOL) 500 MG tablet Take 2 tablets (1,000 mg total) by mouth every 6 (six) hours as needed for mild pain.     ascorbic acid (VITAMIN C) 500 MG tablet Take 1 tablet (500 mg total) by mouth daily. 90 tablet 0   aspirin EC 81 MG tablet Take 81 mg by mouth daily.     atorvastatin (LIPITOR) 40 MG tablet Take 1 tablet (40 mg total) by mouth daily. 30 tablet 1   calcitRIOL (ROCALTROL) 0.5 MCG capsule Take 0.5 mcg by mouth daily.     calcium carbonate (OS-CAL - DOSED IN MG OF ELEMENTAL CALCIUM) 1250 (500 Ca) MG tablet Take 1 tablet (1,250 mg total) by mouth 2 (two) times daily between meals. 60 tablet 1   magnesium oxide (MAG-OX) 400 (240 Mg) MG tablet Take 1 tablet (400 mg total) by mouth daily. 30 tablet 1  metoprolol succinate (TOPROL-XL) 50 MG 24 hr tablet Take 0.5 tablets (25 mg total) by mouth daily. Take with or immediately following a meal. 30 tablet 1   multivitamin (RENA-VIT) TABS tablet Take 1 tablet by mouth at bedtime. 30 tablet 1   Nutritional Supplements (FEEDING  SUPPLEMENT, NEPRO CARB STEADY,) LIQD Take 237 mLs by mouth 3 (three) times daily between meals.  0   ondansetron (ZOFRAN-ODT) 8 MG disintegrating tablet Take 8 mg by mouth every 8 (eight) hours as needed.     sacubitril-valsartan (ENTRESTO) 24-26 MG Take 1 tablet by mouth 2 (two) times daily. 60 tablet 1   VELPHORO 500 MG chewable tablet Chew 1,000 mg by mouth 3 (three) times daily.     Vitamin D, Ergocalciferol, (DRISDOL) 1.25 MG (50000 UNIT) CAPS capsule Take 1 capsule (50,000 Units total) by mouth every 7 (seven) days. (Patient taking differently: Take 50,000 Units by mouth every Saturday.) 5 capsule 1   zinc sulfate 220 (50 Zn) MG capsule Take 1 capsule (220 mg total) by mouth daily. 90 capsule 0   benzonatate (TESSALON) 200 MG capsule Take 200 mg by mouth 3 (three) times daily as needed. (Patient not taking: Reported on 07/24/2021)     No current facility-administered medications on file prior to visit.    There are no Patient Instructions on file for this visit. No follow-ups on file.   Kris Hartmann, NP

## 2021-08-25 NOTE — Progress Notes (Signed)
Subjective:    Patient ID: Anthony Hess, male    DOB: [birth year], 66 y.o.   MRN: [id number] Chief Complaint  Patient presents with   Follow-up    Wound check    The patient returns today for wound evaluation.  The redness is improved but the wound itself has enlarged and there is worsening drainage.  It is more serous in nature but it is significant.  The patient's wife is understandably concerned about ongoing infection.    Review of Systems  Skin:  Positive for wound.  All other systems reviewed and are negative.      Objective:   Physical Exam Vitals reviewed.  HENT:     Head: Normocephalic.  Cardiovascular:     Rate and Rhythm: Normal rate.     Pulses:          Radial pulses are 1+ on the right side and 1+ on the left side.  Pulmonary:     Effort: Pulmonary effort is normal.  Skin:    General: Skin is warm and dry.  Neurological:     Mental Status: He is alert and oriented to person, place, and time.  Psychiatric:        Mood and Affect: Mood normal.        Behavior: Behavior normal.        Thought Content: Thought content normal.        Judgment: Judgment normal.     BP 119/74 (BP Location: Right Arm)   Pulse 74   Resp 16   Wt 123 lb 9.6 oz (56.1 kg)   BMI 21.89 kg/m   Past Medical History:  Diagnosis Date   Aortic atherosclerosis (HCC)    Bilateral carotid artery disease (HCC)    Bladder cancer ([place])    Coronary artery disease 12/20/2018   a.) LHC 12/20/2018: 50% OM1, 40% OM2, 95% o-pLAD, 75% o=pLCx, 40% mLM, 70% D1, 60% mRCA-1, 50% mRCA-2; refer to CVTS. b.) 4v CABG at [facility] on 12/27/2018: LIMA-LAD, RIMA-PDA, seg LRA-OM1-D1   DCM (dilated cardiomyopathy) ([place]) 12/05/2018   a.) TTE 12/05/2018: EF 40-45%. b.) TTE 12/28/2019: EF 20-25%.   ESRD (end stage renal disease) ([place])    a.) T-Th-Sat   HFrEF (heart failure with reduced ejection fraction) ([place]) 12/05/2018   a.) TTE 12/05/2018: EF 40-45%; mild LVH; ant/apical/sep HK; mild TR . b.) TTE 12/28/2019:  EF 20-25%; mod LVH; mod MR/AR; G1DD.   History of 2019 novel coronavirus disease (COVID-19) 04/08/2021   History of kidney stones    HLD (hyperlipidemia)    Hypertension    Infrarenal abdominal aortic aneurysm (AAA) without rupture ([place]) 03/05/2021   a.) CT abd/pelvis; measured 3.2 cm.   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   a.) LIMA-LAD, RIMA-PDA, sequential LEFT radial artery to OM1 and D1   Sepsis ([place]) 03/14/2021   Wears glasses     Social History   Socioeconomic History   Marital status: Married    Spouse name: Not on file   Number of children: Not on file   Years of education: Not on file   Highest education level: Not on file  Occupational History   Not on file  Tobacco Use   Smoking status: Former    Packs/day: 0.25    Types: Cigarettes    Quit date: 02/19/2019    Years since quitting: 2.5   Smokeless tobacco: Never  Vaping Use  Vaping Use: Never used  Substance and Sexual Activity   Alcohol use: Never   Drug use: Never   Sexual activity: Yes  Other Topics Concern   Not on file  Social History Narrative   Not on file   Social Determinants of Health   Financial Resource Strain: Not on file  Food Insecurity: Not on file  Transportation Needs: Not on file  Physical Activity: Not on file  Stress: Not on file  Social Connections: Not on file  Intimate Partner Violence: Not on file    Past Surgical History:  Procedure Laterality Date   AV FISTULA PLACEMENT Left 07/30/2021   Procedure: INSERTION OF ARTERIOVENOUS (AV) GORE-TEX GRAFT ARM BRACHIAL ARTERY TO AXILLARY VEIN;  Surgeon: Algernon Huxley, MD;  Location: [facility];  Service: Vascular;  Laterality: Left;   CAPD INSERTION N/A 12/31/2019   Procedure: LAPAROSCOPIC INSERTION CONTINUOUS AMBULATORY PERITONEAL DIALYSIS  (CAPD) CATHETER;  Surgeon: Jules Husbands, MD;  Location: [facility];  Service: General;  Laterality: N/A;   CAPD REMOVAL N/A 04/10/2020    Procedure: LAPAROSCOPIC REVISION OF CONTINUOUS AMBULATORY PERITONEAL DIALYSIS  (CAPD) CATHETER;  Surgeon: Jules Husbands, MD;  Location: [facility];  Service: General;  Laterality: N/A;   CORONARY ARTERY BYPASS GRAFT N/A 12/27/2018   Procedure: CORONARY ARTERY BYPASS GRAFTING (CABG) X 4 ON PUMP USING RIGHT & LEFT INTERNAL MAMMARY ARTERY LEFT RADIAL ARTERY ENDOSCOPICALLY HARVESTED;  Surgeon: [name], MD;  Location: [place];  Service: Open Heart Surgery;  Laterality: N/A;   CYSTOSCOPY W/ RETROGRADES Bilateral 05/15/2019   Procedure: CYSTOSCOPY WITH RETROGRADE PYELOGRAM;  Surgeon: Abbie Sons, MD;  Location: [facility];  Service: Urology;  Laterality: Bilateral;   CYSTOSCOPY WITH BIOPSY N/A 05/15/2019   Procedure: CYSTOSCOPY WITH bladder BIOPSY;  Surgeon: Abbie Sons, MD;  Location: [facility];  Service: Urology;  Laterality: N/A;   DIALYSIS/PERMA CATHETER INSERTION N/A 12/28/2019   Procedure: DIALYSIS/PERMA CATHETER INSERTION;  Surgeon: Algernon Huxley, MD;  Location: [place] [facility];  Service: Cardiovascular;  Laterality: N/A;   DIALYSIS/PERMA CATHETER INSERTION N/A 03/18/2021   Procedure: DIALYSIS/PERMA CATHETER INSERTION;  Surgeon: Algernon Huxley, MD;  Location: [place] [facility];  Service: Cardiovascular;  Laterality: N/A;   DIALYSIS/PERMA CATHETER REMOVAL N/A 06/02/2020   Procedure: DIALYSIS/PERMA CATHETER REMOVAL;  Surgeon: Algernon Huxley, MD;  Location: [place] [facility];  Service: Cardiovascular;  Laterality: N/A;   EXCHANGE OF A DIALYSIS CATHETER Right 04/10/2020   Procedure: EXCHANGE OF A DIALYSIS CATHETER;  Surgeon: Jules Husbands, MD;  Location: [facility];  Service: General;  Laterality: Right;   [place]  01/20/2021   Procedure: HERNIA REPAIR INCISIONAL;  Surgeon: Olean Ree, MD;  Location: [facility];  Service: General;;   IR IMAGE GUIDED DRAINAGE PERCUT CATH  PERITONEAL RETROPERIT  04/07/2020   LAPAROSCOPY N/A 04/16/2021   Procedure: LAPAROSCOPY DIAGNOSTIC;   Surgeon: Benjamine Sprague, DO;  Location: [facility];  Service: General;  Laterality: N/A;   LEFT HEART CATH AND CORONARY ANGIOGRAPHY Left 12/20/2018   Procedure: LEFT HEART CATH AND CORONARY ANGIOGRAPHY;  Surgeon: Isaias Cowman, MD;  Location: [place] [facility];  Service: Cardiovascular;  Laterality: Left;   RADIAL ARTERY HARVEST Left 12/27/2018   Procedure: ENDOSCOPIC RADIAL ARTERY HARVEST;  Surgeon: [name], MD;  Location: [place];  Service: Open Heart Surgery;  Laterality: Left;   REMOVAL OF A DIALYSIS CATHETER Left 03/20/2021   Procedure: REMOVAL OF A PD CATHETER;  Surgeon: Algernon Huxley,  MD;  Location: [facility];  Service: Vascular;  Laterality: Left;   TEE WITHOUT CARDIOVERSION N/A 12/27/2018   Procedure: TRANSESOPHAGEAL ECHOCARDIOGRAM (TEE);  Surgeon: [name], MD;  Location: [place];  Service: Open Heart Surgery;  Laterality: N/A;   TUMOR REMOVAL  2019   Bladder    Family History  Family history unknown: Yes    No Known Allergies     Latest Ref Rng & Units 07/30/2021   11:41 AM 07/30/2021   11:22 AM 07/27/2021    9:00 AM  CBC  WBC 4.0 - 10.5 K/uL   8.5   Hemoglobin 13.0 - 17.0 g/dL 12.2  13.3  12.5   Hematocrit 39.0 - 52.0 % 36.0  39.0  41.8   Platelets 150 - 400 K/uL   248       CMP     Component Value Date/Time   NA 143 07/30/2021 1141   NA 143 01/04/2020 1542   K 4.6 07/30/2021 1141   CL 107 07/30/2021 1141   CO2 22 07/27/2021 0900   GLUCOSE 79 07/30/2021 1141   BUN 77 (H) 07/30/2021 1141   BUN 52 (H) 01/04/2020 1542   CREATININE 7.60 (H) 07/30/2021 1141   CREATININE 4.62 (H) 01/10/2019 1351   CALCIUM 7.3 (L) 07/27/2021 0900   PROT 4.8 (L) 04/09/2021 0352   PROT 5.7 (L) 01/04/2020 1542   ALBUMIN 2.3 (L) 04/16/2021 0800   ALBUMIN 3.5 (L) 01/04/2020 1542   AST 31 04/09/2021 0352   ALT 47 (H) 04/09/2021 0352   ALKPHOS 49 04/09/2021 0352   BILITOT 0.5 04/09/2021 0352   BILITOT <0.2 01/04/2020 1542   GFRNONAA 8 (L) 07/27/2021 0900   GFRAA 15 (L)  01/04/2020 1542     No results found.     Assessment & Plan:   1. ESRD (end stage renal disease) ([place]) Today although the redness has improved the wound itself is worsened and the drainage is also significantly increased.  Because this is a graft placement, there is concern that there is an underlying infection within the graft area itself.  It is recommended that the patient have the graft removed and healed and then we can reassess for upper extremity dialysis access after healing.  This was discussed with both the patient and wife.  The risk, benefits and alternatives were discussed.  We also discussed that the wound healing process will be somewhat longer as the wound itself will be open and may require wound VAC placement.  They are in agreement.   Current Outpatient Medications on File Prior to Visit  Medication Sig Dispense Refill   acetaminophen (TYLENOL) 500 MG tablet Take 2 tablets (1,000 mg total) by mouth every 6 (six) hours as needed for mild pain.     ascorbic acid (VITAMIN C) 500 MG tablet Take 1 tablet (500 mg total) by mouth daily. 90 tablet 0   aspirin EC 81 MG tablet Take 81 mg by mouth daily.     atorvastatin (LIPITOR) 40 MG tablet Take 1 tablet (40 mg total) by mouth daily. 30 tablet 1   calcitRIOL (ROCALTROL) 0.5 MCG capsule Take 0.5 mcg by mouth daily.     calcium carbonate (OS-CAL - DOSED IN MG OF ELEMENTAL CALCIUM) 1250 (500 Ca) MG tablet Take 1 tablet (1,250 mg total) by mouth 2 (two) times daily between meals. 60 tablet 1   doxycycline (VIBRAMYCIN) 100 MG capsule Take 1 capsule (100 mg total) by mouth 2 (two) times daily. 8 capsule 0  magnesium oxide (MAG-OX) 400 (240 Mg) MG tablet Take 1 tablet (400 mg total) by mouth daily. 30 tablet 1   metoprolol succinate (TOPROL-XL) 50 MG 24 hr tablet Take 0.5 tablets (25 mg total) by mouth daily. Take with or immediately following a meal. 30 tablet 1   multivitamin (RENA-VIT) TABS tablet Take 1 tablet by mouth at bedtime. 30  tablet 1   Nutritional Supplements (FEEDING SUPPLEMENT, NEPRO CARB STEADY,) LIQD Take 237 mLs by mouth 3 (three) times daily between meals.  0   ondansetron (ZOFRAN-ODT) 8 MG disintegrating tablet Take 8 mg by mouth every 8 (eight) hours as needed.     oxyCODONE (OXY IR/ROXICODONE) 5 MG immediate release tablet Take 1 tablet (5 mg total) by mouth every 6 (six) hours as needed for severe pain. 20 tablet 0   sacubitril-valsartan (ENTRESTO) 24-26 MG Take 1 tablet by mouth 2 (two) times daily. 60 tablet 1   VELPHORO 500 MG chewable tablet Chew 1,000 mg by mouth 3 (three) times daily.     Vitamin D, Ergocalciferol, (DRISDOL) 1.25 MG (50000 UNIT) CAPS capsule Take 1 capsule (50,000 Units total) by mouth every 7 (seven) days. (Patient taking differently: Take 50,000 Units by mouth every Saturday.) 5 capsule 1   zinc sulfate 220 (50 Zn) MG capsule Take 1 capsule (220 mg total) by mouth daily. 90 capsule 0   benzonatate (TESSALON) 200 MG capsule Take 200 mg by mouth 3 (three) times daily as needed. (Patient not taking: Reported on 07/24/2021)     No current facility-administered medications on file prior to visit.    There are no Patient Instructions on file for this visit. No follow-ups on file.   Kris Hartmann, NP

## 2021-08-26 ENCOUNTER — Telehealth (INDEPENDENT_AMBULATORY_CARE_PROVIDER_SITE_OTHER): Payer: Self-pay

## 2021-08-26 NOTE — Telephone Encounter (Signed)
Pt LVM stating that he was drained from catheter yesterday and needs to be schedules for surgery, and no one has reached out to him to schedule this.  I called pt back to speak with him because the last time he spoke with Mickel Baas he wanted an appt with [facility] before moving on with surgery.  Pts wife answered and states the pt needs to see Dr Delana Meyer that his shoulder thing is not working properly and the graft in his arm is useless and infected.  He wants to see Dr Delana Meyer and then schedule surgery ASAP.  I transferred her to the front office staff to get him on there schedule with Dr Delana Meyer.

## 2021-08-30 ENCOUNTER — Emergency Department
Admission: EM | Admit: 2021-08-30 | Discharge: 2021-08-30 | Disposition: A | Discharge status: Home / Self Care | Payer: Medicare Other | Attending: Emergency Medicine | Admitting: Emergency Medicine

## 2021-08-30 ENCOUNTER — Other Ambulatory Visit: Payer: Self-pay

## 2021-08-30 ENCOUNTER — Emergency Department: Payer: Medicare Other

## 2021-08-30 DIAGNOSIS — I120 Hypertensive chronic kidney disease with stage 5 chronic kidney disease or end stage renal disease: Secondary | ICD-10-CM | POA: Diagnosis present

## 2021-08-30 DIAGNOSIS — N186 End stage renal disease: Secondary | ICD-10-CM | POA: Diagnosis not present

## 2021-08-30 DIAGNOSIS — I10 Essential (primary) hypertension: Secondary | ICD-10-CM

## 2021-08-30 LAB — CBC WITH DIFFERENTIAL/PLATELET
Abs Immature Granulocytes: 0.04 10*3/uL (ref 0.00–0.07)
Basophils Absolute: 0 10*3/uL (ref 0.0–0.1)
Basophils Relative: 0 %
Eosinophils Absolute: 0.1 10*3/uL (ref 0.0–0.5)
Eosinophils Relative: 1 %
HCT: 40.7 % (ref 39.0–52.0)
Hemoglobin: 12 g/dL — ABNORMAL LOW (ref 13.0–17.0)
Immature Granulocytes: 0 %
Lymphocytes Relative: 7 %
Lymphs Abs: 0.8 10*3/uL (ref 0.7–4.0)
MCH: 27.7 pg (ref 26.0–34.0)
MCHC: 29.5 g/dL — ABNORMAL LOW (ref 30.0–36.0)
MCV: 94 fL (ref 80.0–100.0)
Monocytes Absolute: 0.8 10*3/uL (ref 0.1–1.0)
Monocytes Relative: 7 %
Neutro Abs: 9.4 10*3/uL — ABNORMAL HIGH (ref 1.7–7.7)
Neutrophils Relative %: 85 %
Platelets: 216 10*3/uL (ref 150–400)
RBC: 4.33 MIL/uL (ref 4.22–5.81)
RDW: 21.7 % — ABNORMAL HIGH (ref 11.5–15.5)
Smear Review: NORMAL
WBC: 11.1 10*3/uL — ABNORMAL HIGH (ref 4.0–10.5)
nRBC: 0 % (ref 0.0–0.2)

## 2021-08-30 LAB — TROPONIN I (HIGH SENSITIVITY)
Troponin I (High Sensitivity): 18 ng/L — ABNORMAL HIGH (ref <18)
Troponin I (High Sensitivity): 22 ng/L — ABNORMAL HIGH (ref <18)

## 2021-08-30 LAB — BASIC METABOLIC PANEL
Anion gap: 14 (ref 5–15)
BUN: 42 mg/dL — ABNORMAL HIGH (ref 8–23)
CO2: 20 mmol/L — ABNORMAL LOW (ref 22–32)
Calcium: 7.1 mg/dL — ABNORMAL LOW (ref 8.9–10.3)
Chloride: 112 mmol/L — ABNORMAL HIGH (ref 98–111)
Creatinine, Ser: 6.29 mg/dL — ABNORMAL HIGH (ref 0.61–1.24)
GFR, Estimated: 9 mL/min — ABNORMAL LOW (ref >60)
Glucose, Bld: 98 mg/dL (ref 70–99)
Potassium: 5.3 mmol/L — ABNORMAL HIGH (ref 3.5–5.1)
Sodium: 146 mmol/L — ABNORMAL HIGH (ref 135–145)

## 2021-08-30 MED ORDER — METOPROLOL SUCCINATE ER 50 MG PO TB24
50.0000 mg | ORAL_TABLET | Freq: Every day | ORAL | Status: DC
  Administered 2021-08-30: 50 mg via ORAL
  Filled 2021-08-30: qty 1

## 2021-08-30 MED ORDER — HYDRALAZINE HCL 20 MG/ML IJ SOLN
10.0000 mg | Freq: Once | INTRAMUSCULAR | Status: AC
  Administered 2021-08-30: 10 mg via INTRAVENOUS
  Filled 2021-08-30: qty 1

## 2021-08-30 NOTE — Discharge Instructions (Signed)
Please seek medical attention for any high fevers, chest pain, shortness of breath, change in behavior, persistent vomiting, bloody stool or any other new or concerning symptoms.  

## 2021-08-30 NOTE — ED Triage Notes (Signed)
Pt's wife called out for [facility] and was found to have a bp 238/141- pt does dialysis t/th/s- pt was also having pain in the LUQ- pt also has wound to the L arm and has been getting antibiotics at dialysis

## 2021-08-30 NOTE — ED Provider Notes (Signed)
[facility] Provider Note    Event Date/Time   First MD Initiated Contact with Patient 08/30/21 1353     (approximate)   History   Hypertension   HPI  Anthony Hess is a 66 y.o. male with history of hypertension, ESRD, and anemia who presents with elevated blood pressure as well as some resolved abdominal discomfort and shortness of breath.  The patient states that he was feeling well when he awoke this morning.  About 2 hours ago he started having some shortness of breath and upper abdominal pain.  She states that he felt very unwell.  EMS was called and they found his blood pressure to be significantly elevated as high as 238/141.  The patient states that his symptoms have resolved and he is already feeling a lot better.  He denies any chest pain or headache.  He states he did not take his blood pressure medications this morning but otherwise has been compliant with them.  He states he wants to go home.    Physical Exam   Triage Vital Signs: ED Triage Vitals  Enc Vitals Group     BP 08/30/21 1402 (!) 209/132     Pulse Rate 08/30/21 1353 72     Resp 08/30/21 1353 15     Temp 08/30/21 1402 97.8 F (36.6 C)     Temp Source 08/30/21 1402 Oral     SpO2 08/30/21 1354 95 %     Weight --      Height --      Head Circumference --      Peak Flow --      Pain Score --      Pain Loc --      Pain Edu? --      Excl. in [place]? --     Most recent vital signs: Vitals:   08/30/21 1354 08/30/21 1402  BP:  (!) 209/132  Pulse:    Resp:    Temp:  97.8 F (36.6 C)  SpO2: 95%      General: Alert and oriented, comfortable appearing. CV:  Good peripheral perfusion.  Resp:  Normal effort. Abd:  No distention.  Other:  No peripheral edema.  Left medial arm graft wound clean and intact with no erythema, induration, or purulent drainage.   ED Results / Procedures / Treatments   Labs (all labs ordered are listed, but only abnormal results are displayed) Labs  Reviewed  BASIC METABOLIC PANEL - Abnormal; Notable for the following components:      Result Value   Sodium 146 (*)    Potassium 5.3 (*)    Chloride 112 (*)    CO2 20 (*)    BUN 42 (*)    Creatinine, Ser 6.29 (*)    Calcium 7.1 (*)    GFR, Estimated 9 (*)    All other components within normal limits  CBC WITH DIFFERENTIAL/PLATELET - Abnormal; Notable for the following components:   WBC 11.1 (*)    Hemoglobin 12.0 (*)    MCHC 29.5 (*)    RDW 21.7 (*)    Neutro Abs 9.4 (*)    All other components within normal limits  TROPONIN I (HIGH SENSITIVITY) - Abnormal; Notable for the following components:   Troponin I (High Sensitivity) 18 (*)    All other components within normal limits     EKG  ED ECG REPORT I, Arta Silence, the attending physician, personally viewed and interpreted this ECG.  Date: 08/30/2021  EKG Time: 1355 Rate: 80 Rhythm: normal sinus rhythm QRS Axis: normal Intervals: Nonspecific IVCD ST/T Wave abnormalities: normal Narrative Interpretation: no evidence of acute ischemia; no significant change when compared to EKG of 07/27/2021    RADIOLOGY  Chest x-ray: I independently viewed and interpreted the images; there is no focal consolidation or edema  PROCEDURES:  Critical Care performed: No  Procedures   MEDICATIONS ORDERED IN ED: Medications  hydrALAZINE (APRESOLINE) injection 10 mg (has no administration in time range)  metoprolol succinate (TOPROL-XL) 24 hr tablet 50 mg (has no administration in time range)     IMPRESSION / MDM / ASSESSMENT AND PLAN / ED COURSE  I reviewed the triage vital signs and the nursing notes.  66 year old male with PMH as noted above presents with shortness of breath and upper abdominal discomfort which have now resolved, as well as elevated blood pressure.  I reviewed the past medical records.  The patient has recently been followed by the vascular surgeons for a left arm wound from a graft placement.  When he  was last seen on 6/29 there was concern for acute infection.  He was planned for graft removal but this has not yet occurred.  The patient was most recently admitted in February.  Per the hospitalist discharge summary from 2/24 he was admitted at that time for peritonitis.  Differential diagnosis includes, but is not limited to, hypertensive emergency, hypertensive urgency, ACS, AKI.  However given the resolved symptoms at this time the patient does not have any specific evidence of endorgan dysfunction.  His EKG is nonischemic.  We will obtain a chest x-ray, lab work-up including cardiac enzymes, give IV antihypertensives and reassess.  Patient's presentation is most consistent with acute presentation with potential threat to life or bodily function.  The patient is on the cardiac monitor to evaluate for evidence of arrhythmia and/or significant heart rate changes.  ----------------------------------------- 3:05 PM on 08/30/2021 -----------------------------------------  Lab work-up and BP reassessment pending. The patient out to the oncoming ED physician Dr. Archie Balboa.   FINAL CLINICAL IMPRESSION(S) / ED DIAGNOSES   Final diagnoses:  Hypertension, unspecified type     Rx / DC Orders   ED Discharge Orders     None        Note:  This document was prepared using Dragon voice recognition software and may include unintentional dictation errors.    Arta Silence, MD 08/30/21 1511

## 2021-08-30 NOTE — ED Notes (Signed)
Lab called to get repeat trop

## 2021-09-02 NOTE — H&P (View-Only) (Signed)
MRN : [id number]  Anthony Hess is a 66 y.o. (1955-11-27) male who presents with chief complaint of check access.  History of Present Illness:    The patient is seen for evaluation of dialysis access.  The access appears to be thrombosed and the arterieal wound is open.   Current access is via a catheter which is functioning poorly the flow rates have been less than ideal.  There have not been multiple episodes of catheter infection.  The patient denies fever and chills while on dialysis.  No tenderness or drainage at the exit site.    No outpatient medications have been marked as taking for the 09/03/21 encounter (Appointment) with Delana Meyer, Dolores Lory, MD.    Past Medical History:  Diagnosis Date   Aortic atherosclerosis ([place])    Bilateral carotid artery disease ([place])    Bladder cancer ([place])    Coronary artery disease 12/20/2018   a.) LHC 12/20/2018: 50% OM1, 40% OM2, 95% o-pLAD, 75% o=pLCx, 40% mLM, 70% D1, 60% mRCA-1, 50% mRCA-2; refer to CVTS. b.) 4v CABG at [facility] on 12/27/2018: LIMA-LAD, RIMA-PDA, seg LRA-OM1-D1   DCM (dilated cardiomyopathy) ([place]) 12/05/2018   a.) TTE 12/05/2018: EF 40-45%. b.) TTE 12/28/2019: EF 20-25%.   ESRD (end stage renal disease) ([place])    a.) T-Th-Sat   HFrEF (heart failure with reduced ejection fraction) ([place]) 12/05/2018   a.) TTE 12/05/2018: EF 40-45%; mild LVH; ant/apical/sep HK; mild TR . b.) TTE 12/28/2019: EF 20-25%; mod LVH; mod MR/AR; G1DD.   History of 2019 novel coronavirus disease (COVID-19) 04/08/2021   History of kidney stones    HLD (hyperlipidemia)    Hypertension    Infrarenal abdominal aortic aneurysm (AAA) without rupture ([place]) 03/05/2021   a.) CT abd/pelvis; measured 3.2 cm.   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   a.) LIMA-LAD, RIMA-PDA, sequential LEFT radial artery to OM1 and D1   Sepsis ([place]) 03/14/2021   Wears glasses     Past Surgical History:   Procedure Laterality Date   AV FISTULA PLACEMENT Left 07/30/2021   Procedure: INSERTION OF ARTERIOVENOUS (AV) GORE-TEX GRAFT ARM BRACHIAL ARTERY TO AXILLARY VEIN;  Surgeon: Algernon Huxley, MD;  Location: [facility];  Service: Vascular;  Laterality: Left;   CAPD INSERTION N/A 12/31/2019   Procedure: LAPAROSCOPIC INSERTION CONTINUOUS AMBULATORY PERITONEAL DIALYSIS  (CAPD) CATHETER;  Surgeon: Jules Husbands, MD;  Location: [facility];  Service: General;  Laterality: N/A;   CAPD REMOVAL N/A 04/10/2020   Procedure: LAPAROSCOPIC REVISION OF CONTINUOUS AMBULATORY PERITONEAL DIALYSIS  (CAPD) CATHETER;  Surgeon: Jules Husbands, MD;  Location: [facility];  Service: General;  Laterality: N/A;   CORONARY ARTERY BYPASS GRAFT N/A 12/27/2018   Procedure: CORONARY ARTERY BYPASS GRAFTING (CABG) X 4 ON PUMP USING RIGHT & LEFT INTERNAL MAMMARY ARTERY LEFT RADIAL ARTERY ENDOSCOPICALLY HARVESTED;  Surgeon: [name], MD;  Location: [place];  Service: Open Heart Surgery;  Laterality: N/A;   CYSTOSCOPY W/ RETROGRADES Bilateral 05/15/2019   Procedure: CYSTOSCOPY WITH RETROGRADE PYELOGRAM;  Surgeon: Abbie Sons, MD;  Location: [facility];  Service: Urology;  Laterality: Bilateral;   CYSTOSCOPY WITH BIOPSY N/A 05/15/2019   Procedure: CYSTOSCOPY WITH bladder BIOPSY;  Surgeon: Abbie Sons, MD;  Location: [facility];  Service: Urology;  Laterality: N/A;   DIALYSIS/PERMA CATHETER INSERTION N/A 12/28/2019   Procedure: DIALYSIS/PERMA CATHETER  INSERTION;  Surgeon: Algernon Huxley, MD;  Location: [place] [facility];  Service: Cardiovascular;  Laterality: N/A;   DIALYSIS/PERMA CATHETER INSERTION N/A 03/18/2021   Procedure: DIALYSIS/PERMA CATHETER INSERTION;  Surgeon: Algernon Huxley, MD;  Location: [place] [facility];  Service: Cardiovascular;  Laterality: N/A;   DIALYSIS/PERMA CATHETER REMOVAL N/A 06/02/2020   Procedure: DIALYSIS/PERMA CATHETER REMOVAL;  Surgeon: Algernon Huxley, MD;  Location: [place] [facility];  Service:  Cardiovascular;  Laterality: N/A;   EXCHANGE OF A DIALYSIS CATHETER Right 04/10/2020   Procedure: EXCHANGE OF A DIALYSIS CATHETER;  Surgeon: Jules Husbands, MD;  Location: [facility];  Service: General;  Laterality: Right;   [place]  01/20/2021   Procedure: HERNIA REPAIR INCISIONAL;  Surgeon: Olean Ree, MD;  Location: [facility];  Service: General;;   IR IMAGE GUIDED DRAINAGE PERCUT CATH  PERITONEAL RETROPERIT  04/07/2020   LAPAROSCOPY N/A 04/16/2021   Procedure: LAPAROSCOPY DIAGNOSTIC;  Surgeon: Benjamine Sprague, DO;  Location: [facility];  Service: General;  Laterality: N/A;   LEFT HEART CATH AND CORONARY ANGIOGRAPHY Left 12/20/2018   Procedure: LEFT HEART CATH AND CORONARY ANGIOGRAPHY;  Surgeon: Isaias Cowman, MD;  Location: [place] [facility];  Service: Cardiovascular;  Laterality: Left;   RADIAL ARTERY HARVEST Left 12/27/2018   Procedure: ENDOSCOPIC RADIAL ARTERY HARVEST;  Surgeon: [name], MD;  Location: [place];  Service: Open Heart Surgery;  Laterality: Left;   REMOVAL OF A DIALYSIS CATHETER Left 03/20/2021   Procedure: REMOVAL OF A PD CATHETER;  Surgeon: Algernon Huxley, MD;  Location: [facility];  Service: Vascular;  Laterality: Left;   TEE WITHOUT CARDIOVERSION N/A 12/27/2018   Procedure: TRANSESOPHAGEAL ECHOCARDIOGRAM (TEE);  Surgeon: [name], MD;  Location: [place];  Service: Open Heart Surgery;  Laterality: N/A;   TUMOR REMOVAL  2019   Bladder    Social History Social History   Tobacco Use   Smoking status: Former    Packs/day: 0.25    Types: Cigarettes    Quit date: 02/19/2019    Years since quitting: 2.5   Smokeless tobacco: Never  Vaping Use   Vaping Use: Never used  Substance Use Topics   Alcohol use: Never   Drug use: Never    Family History Family History  Family history unknown: Yes    No Known Allergies   REVIEW OF SYSTEMS (Negative unless checked)  Constitutional: '[]'$ Weight loss  '[]'$ Fever  '[]'$ Chills Cardiac: '[]'$ Chest pain    '[]'$ Chest pressure   '[]'$ Palpitations   '[]'$ Shortness of breath when laying flat   '[]'$ Shortness of breath with exertion. Vascular:  '[]'$ Pain in legs with walking   '[]'$ Pain in legs at rest  '[]'$ History of DVT   '[]'$ Phlebitis   '[]'$ Swelling in legs   '[]'$ Varicose veins   '[]'$ Non-healing ulcers Pulmonary:   '[]'$ Uses home oxygen   '[]'$ Productive cough   '[]'$ Hemoptysis   '[]'$ Wheeze  '[]'$ COPD   '[]'$ Asthma Neurologic:  '[]'$ Dizziness   '[]'$ Seizures   '[]'$ History of stroke   '[]'$ History of TIA  '[]'$ Aphasia   '[]'$ Vissual changes   '[]'$ Weakness or numbness in arm   '[]'$ Weakness or numbness in leg Musculoskeletal:   '[]'$ Joint swelling   '[]'$ Joint pain   '[]'$ Low back pain Hematologic:  '[]'$ Easy bruising  '[]'$ Easy bleeding   '[]'$ Hypercoagulable state   '[]'$ Anemic Gastrointestinal:  '[]'$ Diarrhea   '[]'$ Vomiting  '[]'$ Gastroesophageal reflux/heartburn   '[]'$ Difficulty swallowing. Genitourinary:  '[x]'$ Chronic kidney disease   '[]'$ Difficult urination  '[]'$ Frequent urination   '[]'$ Blood in urine Skin:  '[]'$ Rashes   '[]'$   Ulcers  Psychological:  '[]'$ History of anxiety   '[]'$  History of major depression.  Physical Examination  There were no vitals filed for this visit. There is no height or weight on file to calculate BMI. Gen: WD/WN, NAD Head: [place]/AT, No temporalis wasting.  Ear/Nose/Throat: Hearing grossly intact, nares w/o erythema or drainage Eyes: PER, EOMI, sclera nonicteric.  Neck: Supple, no gross masses or lesions.  No JVD.  Pulmonary:  Good air movement, no audible wheezing, no use of accessory muscles.  Cardiac: RRR, precordium non-hyperdynamic. Vascular:   left brachial axillary graft is thrombosed and the arterial incision is open with muscle  exposed Vessel Right Left  Radial Palpable Palpable  Brachial Palpable Palpable  Gastrointestinal: soft, non-distended. No guarding/no peritoneal signs.  Musculoskeletal: M/S 5/5 throughout.  No deformity.  Neurologic: CN 2-12 intact. Pain and light touch intact in extremities.  Symmetrical.  Speech is fluent. Motor exam as listed  above. Psychiatric: Judgment intact, Mood & affect appropriate for pt's clinical situation. Dermatologic: No rashes or ulcers noted.  No changes consistent with cellulitis.   CBC Lab Results  Component Value Date   WBC 11.1 (H) 08/30/2021   HGB 12.0 (L) 08/30/2021   HCT 40.7 08/30/2021   MCV 94.0 08/30/2021   PLT 216 08/30/2021    BMET    Component Value Date/Time   NA 146 (H) 08/30/2021 1416   NA 143 01/04/2020 1542   K 5.3 (H) 08/30/2021 1416   CL 112 (H) 08/30/2021 1416   CO2 20 (L) 08/30/2021 1416   GLUCOSE 98 08/30/2021 1416   BUN 42 (H) 08/30/2021 1416   BUN 52 (H) 01/04/2020 1542   CREATININE 6.29 (H) 08/30/2021 1416   CREATININE 4.62 (H) 01/10/2019 1351   CALCIUM 7.1 (L) 08/30/2021 1416   GFRNONAA 9 (L) 08/30/2021 1416   GFRAA 15 (L) 01/04/2020 1542   Estimated Creatinine Clearance: 9.5 mL/min (A) (by C-G formula based on SCr of 6.29 mg/dL (H)).  COAG Lab Results  Component Value Date   INR 1.7 (H) 03/14/2021   INR 1.2 03/05/2021   INR 1.3 (H) 12/27/2018    Radiology DG Chest 2 View  Result Date: 08/30/2021 CLINICAL DATA:  Shortness of breath. EXAM: CHEST - 2 VIEW COMPARISON:  Chest x-ray dated April 08, 2021. FINDINGS: Unchanged tunneled right internal jugular dialysis catheter with tip at the cavoatrial junction. The heart size and mediastinal contours are within normal limits. Prior CABG. Normal pulmonary vascularity. No focal consolidation, pleural effusion, or pneumothorax. No acute osseous abnormality. IMPRESSION: No active cardiopulmonary disease. Electronically Signed   By: Titus Dubin M.D.   On: 08/30/2021 14:32     Assessment/Plan 1. ESRD (end stage renal disease) ([place]) Recommend:  At this time the patient does not have appropriate extremity access for dialysis  Patient should have the left av graft removed.  Once that has healed venography will be performed and then new access will be planned.  His catheter is not working well and I  will arrange a tpa infusion.  The risks, benefits and alternative therapies were reviewed in detail with the patient.  All questions were answered.  The patient agrees to proceed with surgery.   The patient will follow up with me in the office after the surgery.   2. Infrarenal abdominal aortic aneurysm (AAA) without rupture (HCC) Recommend: No surgery or intervention is indicated at this time.  CT scan 03/16/2021:  Unchanged 3.2 cm abdominal aortic aneurysm  The patient has an asymptomatic abdominal aortic  aneurysm that is less than 4 cm in maximal diameter.    I have reviewed the natural history of abdominal aortic aneurysm and the small risk of rupture for aneurysm less than 5 cm in size.  However, as these small aneurysms tend to enlarge over time, continued surveillance with ultrasound or CT scan is mandatory.   I have also discussed optimizing medical management with hypertension and lipid control and the importance of abstinence from tobacco.  The patient is also encouraged to exercise a minimum of 30 minutes 4 times a week.   Should the patient develop new onset abdominal or back pain or signs of peripheral embolization they are instructed to seek medical attention immediately and to alert the physician providing care that they have an aneurysm.   The patient voices their understanding.  The patient will return in 12 months with an aortic duplex.   3. Primary hypertension Continue antihypertensive medications as already ordered, these medications have been reviewed and there are no changes at this time.   4. Atrial fibrillation, unspecified type ([place]) Continue antiarrhythmia medications as already ordered, these medications have been reviewed and there are no changes at this time.  Continue anticoagulation as ordered by Cardiology Service   5. Pulmonary emphysema, unspecified emphysema type ([place]) Continue pulmonary medications and aerosols as already ordered, these  medications have been reviewed and there are no changes at this time.      Hortencia Pilar, MD  09/02/2021 5:00 PM

## 2021-09-02 NOTE — Progress Notes (Signed)
MRN : [id number]  Anthony Hess is a 66 y.o. ([birth year]) male who presents with chief complaint of check access.  History of Present Illness:    The patient is seen for evaluation of dialysis access.  The access appears to be thrombosed and the arterieal wound is open.   Current access is via a catheter which is functioning poorly the flow rates have been less than ideal.  There have not been multiple episodes of catheter infection.  The patient denies fever and chills while on dialysis.  No tenderness or drainage at the exit site.    No outpatient medications have been marked as taking for the 09/03/21 encounter (Appointment) with Delana Meyer, Dolores Lory, MD.    Past Medical History:  Diagnosis Date   Aortic atherosclerosis ([place])    Bilateral carotid artery disease ([place])    Bladder cancer ([place])    Coronary artery disease 12/20/2018   a.) LHC 12/20/2018: 50% OM1, 40% OM2, 95% o-pLAD, 75% o=pLCx, 40% mLM, 70% D1, 60% mRCA-1, 50% mRCA-2; refer to CVTS. b.) 4v CABG at [facility] on 12/27/2018: LIMA-LAD, RIMA-PDA, seg LRA-OM1-D1   DCM (dilated cardiomyopathy) ([place]) 12/05/2018   a.) TTE 12/05/2018: EF 40-45%. b.) TTE 12/28/2019: EF 20-25%.   ESRD (end stage renal disease) ([place])    a.) T-Th-Sat   HFrEF (heart failure with reduced ejection fraction) ([place]) 12/05/2018   a.) TTE 12/05/2018: EF 40-45%; mild LVH; ant/apical/sep HK; mild TR . b.) TTE 12/28/2019: EF 20-25%; mod LVH; mod MR/AR; G1DD.   History of 2019 novel coronavirus disease (COVID-19) 04/08/2021   History of kidney stones    HLD (hyperlipidemia)    Hypertension    Infrarenal abdominal aortic aneurysm (AAA) without rupture ([place]) 03/05/2021   a.) CT abd/pelvis; measured 3.2 cm.   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   a.) LIMA-LAD, RIMA-PDA, sequential LEFT radial artery to OM1 and D1   Sepsis ([place]) 03/14/2021   Wears glasses     Past Surgical History:   Procedure Laterality Date   AV FISTULA PLACEMENT Left 07/30/2021   Procedure: INSERTION OF ARTERIOVENOUS (AV) GORE-TEX GRAFT ARM BRACHIAL ARTERY TO AXILLARY VEIN;  Surgeon: Algernon Huxley, MD;  Location: [facility];  Service: Vascular;  Laterality: Left;   CAPD INSERTION N/A 12/31/2019   Procedure: LAPAROSCOPIC INSERTION CONTINUOUS AMBULATORY PERITONEAL DIALYSIS  (CAPD) CATHETER;  Surgeon: Jules Husbands, MD;  Location: [facility];  Service: General;  Laterality: N/A;   CAPD REMOVAL N/A 04/10/2020   Procedure: LAPAROSCOPIC REVISION OF CONTINUOUS AMBULATORY PERITONEAL DIALYSIS  (CAPD) CATHETER;  Surgeon: Jules Husbands, MD;  Location: [facility];  Service: General;  Laterality: N/A;   CORONARY ARTERY BYPASS GRAFT N/A 12/27/2018   Procedure: CORONARY ARTERY BYPASS GRAFTING (CABG) X 4 ON PUMP USING RIGHT & LEFT INTERNAL MAMMARY ARTERY LEFT RADIAL ARTERY ENDOSCOPICALLY HARVESTED;  Surgeon: [name], MD;  Location: [place];  Service: Open Heart Surgery;  Laterality: N/A;   CYSTOSCOPY W/ RETROGRADES Bilateral 05/15/2019   Procedure: CYSTOSCOPY WITH RETROGRADE PYELOGRAM;  Surgeon: Abbie Sons, MD;  Location: [facility];  Service: Urology;  Laterality: Bilateral;   CYSTOSCOPY WITH BIOPSY N/A 05/15/2019   Procedure: CYSTOSCOPY WITH bladder BIOPSY;  Surgeon: Abbie Sons, MD;  Location: [facility];  Service: Urology;  Laterality: N/A;   DIALYSIS/PERMA CATHETER INSERTION N/A 12/28/2019   Procedure: DIALYSIS/PERMA CATHETER  INSERTION;  Surgeon: Algernon Huxley, MD;  Location: [place] [facility];  Service: Cardiovascular;  Laterality: N/A;   DIALYSIS/PERMA CATHETER INSERTION N/A 03/18/2021   Procedure: DIALYSIS/PERMA CATHETER INSERTION;  Surgeon: Algernon Huxley, MD;  Location: [place] [facility];  Service: Cardiovascular;  Laterality: N/A;   DIALYSIS/PERMA CATHETER REMOVAL N/A 06/02/2020   Procedure: DIALYSIS/PERMA CATHETER REMOVAL;  Surgeon: Algernon Huxley, MD;  Location: [place] [facility];  Service:  Cardiovascular;  Laterality: N/A;   EXCHANGE OF A DIALYSIS CATHETER Right 04/10/2020   Procedure: EXCHANGE OF A DIALYSIS CATHETER;  Surgeon: Jules Husbands, MD;  Location: [facility];  Service: General;  Laterality: Right;   [place]  01/20/2021   Procedure: HERNIA REPAIR INCISIONAL;  Surgeon: Olean Ree, MD;  Location: [facility];  Service: General;;   IR IMAGE GUIDED DRAINAGE PERCUT CATH  PERITONEAL RETROPERIT  04/07/2020   LAPAROSCOPY N/A 04/16/2021   Procedure: LAPAROSCOPY DIAGNOSTIC;  Surgeon: Benjamine Sprague, DO;  Location: [facility];  Service: General;  Laterality: N/A;   LEFT HEART CATH AND CORONARY ANGIOGRAPHY Left 12/20/2018   Procedure: LEFT HEART CATH AND CORONARY ANGIOGRAPHY;  Surgeon: Isaias Cowman, MD;  Location: [place] [facility];  Service: Cardiovascular;  Laterality: Left;   RADIAL ARTERY HARVEST Left 12/27/2018   Procedure: ENDOSCOPIC RADIAL ARTERY HARVEST;  Surgeon: [name], MD;  Location: [place];  Service: Open Heart Surgery;  Laterality: Left;   REMOVAL OF A DIALYSIS CATHETER Left 03/20/2021   Procedure: REMOVAL OF A PD CATHETER;  Surgeon: Algernon Huxley, MD;  Location: [facility];  Service: Vascular;  Laterality: Left;   TEE WITHOUT CARDIOVERSION N/A 12/27/2018   Procedure: TRANSESOPHAGEAL ECHOCARDIOGRAM (TEE);  Surgeon: [name], MD;  Location: [place];  Service: Open Heart Surgery;  Laterality: N/A;   TUMOR REMOVAL  2019   Bladder    Social History Social History   Tobacco Use   Smoking status: Former    Packs/day: 0.25    Types: Cigarettes    Quit date: 02/19/2019    Years since quitting: 2.5   Smokeless tobacco: Never  Vaping Use   Vaping Use: Never used  Substance Use Topics   Alcohol use: Never   Drug use: Never    Family History Family History  Family history unknown: Yes    No Known Allergies   REVIEW OF SYSTEMS (Negative unless checked)  Constitutional: '[]'$ Weight loss  '[]'$ Fever  '[]'$ Chills Cardiac: '[]'$ Chest pain    '[]'$ Chest pressure   '[]'$ Palpitations   '[]'$ Shortness of breath when laying flat   '[]'$ Shortness of breath with exertion. Vascular:  '[]'$ Pain in legs with walking   '[]'$ Pain in legs at rest  '[]'$ History of DVT   '[]'$ Phlebitis   '[]'$ Swelling in legs   '[]'$ Varicose veins   '[]'$ Non-healing ulcers Pulmonary:   '[]'$ Uses home oxygen   '[]'$ Productive cough   '[]'$ Hemoptysis   '[]'$ Wheeze  '[]'$ COPD   '[]'$ Asthma Neurologic:  '[]'$ Dizziness   '[]'$ Seizures   '[]'$ History of stroke   '[]'$ History of TIA  '[]'$ Aphasia   '[]'$ Vissual changes   '[]'$ Weakness or numbness in arm   '[]'$ Weakness or numbness in leg Musculoskeletal:   '[]'$ Joint swelling   '[]'$ Joint pain   '[]'$ Low back pain Hematologic:  '[]'$ Easy bruising  '[]'$ Easy bleeding   '[]'$ Hypercoagulable state   '[]'$ Anemic Gastrointestinal:  '[]'$ Diarrhea   '[]'$ Vomiting  '[]'$ Gastroesophageal reflux/heartburn   '[]'$ Difficulty swallowing. Genitourinary:  '[x]'$ Chronic kidney disease   '[]'$ Difficult urination  '[]'$ Frequent urination   '[]'$ Blood in urine Skin:  '[]'$ Rashes   '[]'$   Ulcers  Psychological:  '[]'$ History of anxiety   '[]'$  History of major depression.  Physical Examination  There were no vitals filed for this visit. There is no height or weight on file to calculate BMI. Gen: WD/WN, NAD Head: [place]/AT, No temporalis wasting.  Ear/Nose/Throat: Hearing grossly intact, nares w/o erythema or drainage Eyes: PER, EOMI, sclera nonicteric.  Neck: Supple, no gross masses or lesions.  No JVD.  Pulmonary:  Good air movement, no audible wheezing, no use of accessory muscles.  Cardiac: RRR, precordium non-hyperdynamic. Vascular:   left brachial axillary graft is thrombosed and the arterial incision is open with muscle  exposed Vessel Right Left  Radial Palpable Palpable  Brachial Palpable Palpable  Gastrointestinal: soft, non-distended. No guarding/no peritoneal signs.  Musculoskeletal: M/S 5/5 throughout.  No deformity.  Neurologic: CN 2-12 intact. Pain and light touch intact in extremities.  Symmetrical.  Speech is fluent. Motor exam as listed  above. Psychiatric: Judgment intact, Mood & affect appropriate for pt's clinical situation. Dermatologic: No rashes or ulcers noted.  No changes consistent with cellulitis.   CBC Lab Results  Component Value Date   WBC 11.1 (H) 08/30/2021   HGB 12.0 (L) 08/30/2021   HCT 40.7 08/30/2021   MCV 94.0 08/30/2021   PLT 216 08/30/2021    BMET    Component Value Date/Time   NA 146 (H) 08/30/2021 1416   NA 143 01/04/2020 1542   K 5.3 (H) 08/30/2021 1416   CL 112 (H) 08/30/2021 1416   CO2 20 (L) 08/30/2021 1416   GLUCOSE 98 08/30/2021 1416   BUN 42 (H) 08/30/2021 1416   BUN 52 (H) 01/04/2020 1542   CREATININE 6.29 (H) 08/30/2021 1416   CREATININE 4.62 (H) 01/10/2019 1351   CALCIUM 7.1 (L) 08/30/2021 1416   GFRNONAA 9 (L) 08/30/2021 1416   GFRAA 15 (L) 01/04/2020 1542   Estimated Creatinine Clearance: 9.5 mL/min (A) (by C-G formula based on SCr of 6.29 mg/dL (H)).  COAG Lab Results  Component Value Date   INR 1.7 (H) 03/14/2021   INR 1.2 03/05/2021   INR 1.3 (H) 12/27/2018    Radiology DG Chest 2 View  Result Date: 08/30/2021 CLINICAL DATA:  Shortness of breath. EXAM: CHEST - 2 VIEW COMPARISON:  Chest x-ray dated April 08, 2021. FINDINGS: Unchanged tunneled right internal jugular dialysis catheter with tip at the cavoatrial junction. The heart size and mediastinal contours are within normal limits. Prior CABG. Normal pulmonary vascularity. No focal consolidation, pleural effusion, or pneumothorax. No acute osseous abnormality. IMPRESSION: No active cardiopulmonary disease. Electronically Signed   By: Titus Dubin M.D.   On: 08/30/2021 14:32     Assessment/Plan 1. ESRD (end stage renal disease) ([place]) Recommend:  At this time the patient does not have appropriate extremity access for dialysis  Patient should have the left av graft removed.  Once that has healed venography will be performed and then new access will be planned.  His catheter is not working well and I  will arrange a tpa infusion.  The risks, benefits and alternative therapies were reviewed in detail with the patient.  All questions were answered.  The patient agrees to proceed with surgery.   The patient will follow up with me in the office after the surgery.   2. Infrarenal abdominal aortic aneurysm (AAA) without rupture (HCC) Recommend: No surgery or intervention is indicated at this time.  CT scan 03/16/2021:  Unchanged 3.2 cm abdominal aortic aneurysm  The patient has an asymptomatic abdominal aortic  aneurysm that is less than 4 cm in maximal diameter.    I have reviewed the natural history of abdominal aortic aneurysm and the small risk of rupture for aneurysm less than 5 cm in size.  However, as these small aneurysms tend to enlarge over time, continued surveillance with ultrasound or CT scan is mandatory.   I have also discussed optimizing medical management with hypertension and lipid control and the importance of abstinence from tobacco.  The patient is also encouraged to exercise a minimum of 30 minutes 4 times a week.   Should the patient develop new onset abdominal or back pain or signs of peripheral embolization they are instructed to seek medical attention immediately and to alert the physician providing care that they have an aneurysm.   The patient voices their understanding.  The patient will return in 12 months with an aortic duplex.   3. Primary hypertension Continue antihypertensive medications as already ordered, these medications have been reviewed and there are no changes at this time.   4. Atrial fibrillation, unspecified type ([place]) Continue antiarrhythmia medications as already ordered, these medications have been reviewed and there are no changes at this time.  Continue anticoagulation as ordered by Cardiology Service   5. Pulmonary emphysema, unspecified emphysema type ([place]) Continue pulmonary medications and aerosols as already ordered, these  medications have been reviewed and there are no changes at this time.      Hortencia Pilar, MD  09/02/2021 5:00 PM

## 2021-09-03 ENCOUNTER — Ambulatory Visit (INDEPENDENT_AMBULATORY_CARE_PROVIDER_SITE_OTHER): Payer: Medicare Other | Admitting: Vascular Surgery

## 2021-09-03 ENCOUNTER — Encounter (INDEPENDENT_AMBULATORY_CARE_PROVIDER_SITE_OTHER): Payer: Self-pay | Admitting: Vascular Surgery

## 2021-09-03 ENCOUNTER — Telehealth (INDEPENDENT_AMBULATORY_CARE_PROVIDER_SITE_OTHER): Payer: Self-pay

## 2021-09-03 VITALS — BP 194/118 | HR 75 | Resp 16 | Wt 123.0 lb

## 2021-09-03 DIAGNOSIS — I7143 Infrarenal abdominal aortic aneurysm, without rupture: Secondary | ICD-10-CM

## 2021-09-03 DIAGNOSIS — I4891 Unspecified atrial fibrillation: Secondary | ICD-10-CM

## 2021-09-03 DIAGNOSIS — N186 End stage renal disease: Secondary | ICD-10-CM

## 2021-09-03 DIAGNOSIS — I10 Essential (primary) hypertension: Secondary | ICD-10-CM

## 2021-09-03 DIAGNOSIS — J439 Emphysema, unspecified: Secondary | ICD-10-CM

## 2021-09-03 NOTE — Telephone Encounter (Addendum)
I attempted to contact the patient regarding his excision of left AV graft and TPA infusion with Dr. Delana Meyer. A message was left for a return call. Patient returned my call and was given the information about his procedure on 09/07/21 with a 10:00 am arrival time to the [facility] and his pre-surgical instructions were also discussed. Pre-procedure instructions will be mailed.

## 2021-09-04 NOTE — Telephone Encounter (Signed)
I have attempted to contact the patient multiple times to no avail to discuss his TPA infusion on 09/07/21 and his surgery on 09/11/21. I have left messages each time for a return call.

## 2021-09-07 ENCOUNTER — Other Ambulatory Visit (INDEPENDENT_AMBULATORY_CARE_PROVIDER_SITE_OTHER): Payer: Self-pay | Admitting: Nurse Practitioner

## 2021-09-07 ENCOUNTER — Ambulatory Visit
Admission: RE | Admit: 2021-09-07 | Discharge: 2021-09-07 | Disposition: A | Discharge status: Home / Self Care | Payer: Medicare Other | Source: Ambulatory Visit | Attending: Vascular Surgery | Admitting: Vascular Surgery

## 2021-09-07 DIAGNOSIS — N186 End stage renal disease: Secondary | ICD-10-CM | POA: Insufficient documentation

## 2021-09-07 MED ORDER — SODIUM CHLORIDE 0.9 % IV SOLN
5.0000 mg | Freq: Once | INTRAVENOUS | Status: AC
  Administered 2021-09-07: 5 mg via INTRAVENOUS
  Filled 2021-09-07: qty 5

## 2021-09-07 NOTE — Discharge Instructions (Signed)
[facility] tpa [place] This sheet gives you information about how to care for yourself after your procedure. Your health care provider may also give you more specific instructions. If you have problems or questions, contact your health care provider. Follow these instructions at home:   Follow instructions from your health care provider about how to take care of your incision. Make sure you: Wash your hands with soap and water before you touch anywhere around your catheter site.   If soap and water are not available, use hand sanitizer. Your dressing will be changed at your dialysis center  Check your incision area every day for signs of infection and bleeding. Check for: More redness, swelling, or pain. More fluid or blood. Warmth. Pus or a bad smell. fever  Medicines Take over-the-counter and prescription medicines only as told by your health care provider. If you were prescribed an antibiotic medicine, use it as told by your health care provider. Do not stop using the antibiotic even if you start to feel better. Activity Return to your normal activities as told by your health care provider. Ask your health care provider what activities are safe for you. Do not lift anything that is heavier than 10 lb (4.5 kg) until your health care provider says that this is safe. Driving Do not drive for 24 hours if you were given a medicine to help you relax (sedative) during your procedure. Do not drive or use heavy machinery while taking prescription pain medicine. Lifestyle Limit alcohol intake to no more than 1 drink a day for nonpregnant women and 2 drinks a day for men. One drink equals 12 oz of beer, 5 oz of wine, or 1 oz of hard liquor. Do not use any products that contain nicotine or tobacco, such as cigarettes and e-cigarettes. If you need help quitting, ask your health care provider. General instructions Do not take baths or showers, swim, or use a hot tub until your  health care provider approves. You may only be allowed to take sponge baths for bathing. Wear compression stockings as told by your health care provider. These stockings help to prevent blood clots and reduce swelling in your legs. Follow instructions from your health care provider about eating or drinking restrictions. Keep all follow-up visits as told by your health care provider. This is important. Contact a health care provider if: Your catheter gets pulled out of place. Your catheter site becomes itchy. You develop a rash around your catheter site. You have more redness, swelling, or pain around your incision. You have more fluid or blood coming from your incision. Your incision area feels warm to the touch. You have pus or a bad smell coming from your incision. You have a fever. Get help right away if: You become light-headed or dizzy. You faint. You have difficulty breathing. Your catheter gets pulled out completely. This information is not intended to replace advice given to you by your health care provider. Make sure you discuss any questions you have with your health care provider. Document Released: 09/23/2003 Document Revised: 11/03/2015 Document Reviewed: 11/03/2015 Elsevier Interactive Patient Education  2019 [employer] 

## 2021-09-09 ENCOUNTER — Inpatient Hospital Stay
Admission: RE | Admit: 2021-09-09 | Discharge: 2021-09-09 | Disposition: A | Discharge status: Home / Self Care | Payer: Medicare Other | Source: Ambulatory Visit

## 2021-09-09 ENCOUNTER — Other Ambulatory Visit (INDEPENDENT_AMBULATORY_CARE_PROVIDER_SITE_OTHER): Payer: Self-pay | Admitting: Nurse Practitioner

## 2021-09-09 DIAGNOSIS — N186 End stage renal disease: Secondary | ICD-10-CM

## 2021-09-10 ENCOUNTER — Encounter
Admission: RE | Admit: 2021-09-10 | Discharge: 2021-09-10 | Disposition: A | Discharge status: Home / Self Care | Payer: Medicare Other | Source: Ambulatory Visit | Attending: Vascular Surgery | Admitting: Vascular Surgery

## 2021-09-10 DIAGNOSIS — Z01818 Encounter for other preprocedural examination: Secondary | ICD-10-CM

## 2021-09-10 NOTE — Patient Instructions (Signed)
Your procedure is scheduled on:09-11-21 Friday Report to the Registration Desk on the 1st floor of the [facility]. Then proceed to the 2nd floor Surgery Desk-Arrive at 6 am If your arrival time is 6:00 am, do not arrive prior to that time as the [facility] entrance doors do not open until 6:00 am.  REMEMBER: Instructions that are not followed completely may result in serious medical risk, up to and including death; or upon the discretion of your surgeon and anesthesiologist your surgery may need to be rescheduled.  Do not eat food OR drink any liquids after midnight the night before surgery.  No gum chewing, lozengers or hard candies.  TAKE THESE MEDICATIONS THE MORNING OF SURGERY WITH A SIP OF WATER: -atorvastatin (LIPITOR)  -metoprolol succinate (TOPROL-XL)   Continue your 81 mg Aspirin-Do NOT take the day of surgery  One week prior to surgery: Stop Anti-inflammatories (NSAIDS) such as Advil, Aleve, Ibuprofen, Motrin, Naproxen, Naprosyn and Aspirin based products such as Excedrin, Goodys Powder, BC Powder You may however, continue to take Tylenol if needed for pain up until the day of surgery.  No Alcohol for 24 hours before or after surgery.  No Smoking including e-cigarettes for 24 hours prior to surgery.  No chewable tobacco products for at least 6 hours prior to surgery.  No nicotine patches on the day of surgery.  Do not use any "recreational" drugs for at least a week prior to your surgery.  Please be advised that the combination of cocaine and anesthesia may have negative outcomes, up to and including death. If you test positive for cocaine, your surgery will be cancelled.  On the morning of surgery brush your teeth with toothpaste and water, you may rinse your mouth with mouthwash if you wish. Do not swallow any toothpaste or mouthwash.  Do not wear jewelry, make-up, hairpins, clips or nail polish.  Do not wear lotions, powders, or perfumes.   Do not shave body from  the neck down 48 hours prior to surgery just in case you cut yourself which could leave a site for infection.  Also, freshly shaved skin may become irritated if using the CHG soap.  Contact lenses, hearing aids and dentures may not be worn into surgery.  Do not bring valuables to the hospital. [facility] is not responsible for any missing/lost belongings or valuables.   Notify your doctor if there is any change in your medical condition (cold, fever, infection).  Wear comfortable clothing (specific to your surgery type) to the hospital.  After surgery, you can help prevent lung complications by doing breathing exercises.  Take deep breaths and cough every 1-2 hours. Your doctor may order a device called an Incentive Spirometer to help you take deep breaths. When coughing or sneezing, hold a pillow firmly against your incision with both hands. This is called "splinting." Doing this helps protect your incision. It also decreases belly discomfort.  If you are being admitted to the hospital overnight, leave your suitcase in the car. After surgery it may be brought to your room.  If you are being discharged the day of surgery, you will not be allowed to drive home. You will need a responsible adult (18 years or older) to drive you home and stay with you that night.   If you are taking public transportation, you will need to have a responsible adult (18 years or older) with you. Please confirm with your physician that it is acceptable to use public transportation.  Please call the [facility] at [phone] if you have any questions about these instructions.  Surgery Visitation Policy:  Patients undergoing a surgery or procedure may have two family members or support persons with them as long as the person is not COVID-19 positive or experiencing its symptoms.

## 2021-09-10 NOTE — Anesthesia Preprocedure Evaluation (Addendum)
Anesthesia Evaluation  Patient identified by MRN, date of birth, ID band Patient awake    Reviewed: Allergy & Precautions, NPO status , Patient's Chart, lab work & pertinent test results, reviewed documented beta blocker date and time   Airway Mallampati: III  TM Distance: >3 FB Neck ROM: full    Dental  (+) Poor Dentition, Missing, Edentulous Upper   Pulmonary COPD, former smoker,    Pulmonary exam normal        Cardiovascular Exercise Tolerance: Good hypertension, Pt. on medications and Pt. on home beta blockers + CAD, + Past MI, + CABG, + Peripheral Vascular Disease and +CHF  Normal cardiovascular exam+ dysrhythmias Atrial Fibrillation   Poorly controlled blood pressure  EKTG Sinus rhythm Nonspecific IVCD with LAD Left ventricular hypertrophy Anterior Q waves, possibly due to LVH  MPS 1. Severely reduced left ventricular systolic function with an EF of <30% 2. Medium defect of moderate severity present in the basal anterior, basal anteroseptal, basal anterolateral, mid anterior, mid anteroseptal, mid anterolateral, apical anterior, and apex locations.  Findings consistent with prior myocardial infarction with peri-infarct ischemia 3. No T wave inversion noted during stress 4. Blood pressure demonstrated normal response to exercise 5. This is a high risk study   Neuro/Psych negative neurological ROS  negative psych ROS   GI/Hepatic negative GI ROS, Neg liver ROS,   Endo/Other  negative endocrine ROS  Renal/GU Dialysis and ESRFRenal disease     Musculoskeletal   Abdominal Normal abdominal exam  (+)   Peds  Hematology  (+) Blood dyscrasia, anemia ,   Anesthesia Other Findings Past Medical History: No date: Aortic atherosclerosis (HCC) No date: Bilateral carotid artery disease ([place]) No date: Bladder cancer ([place]) 12/20/2018: Coronary artery disease     Comment:  a.) LHC 12/20/2018: 50% OM1, 40% OM2, 95% o-pLAD,  75%               o=pLCx, 40% mLM, 70% D1, 60% mRCA-1, 50% mRCA-2; refer to              CVTS. b.) 4v CABG at [facility] on 12/27/2018: LIMA-LAD,               RIMA-PDA, seg LRA-OM1-D1 12/05/2018: DCM (dilated cardiomyopathy) ([place])     Comment:  a.) TTE 12/05/2018: EF 40-45%. b.) TTE 12/28/2019: EF               20-25%. No date: ESRD (end stage renal disease) ([place])     Comment:  a.) T-Th-Sat 12/05/2018: HFrEF (heart failure with reduced ejection fraction) ([place])     Comment:  a.) TTE 12/05/2018: EF 40-45%; mild LVH; ant/apical/sep               HK; mild TR . b.) TTE 12/28/2019: EF 20-25%; mod LVH; mod              MR/AR; G1DD. 04/08/2021: History of 2019 novel coronavirus disease (COVID-19) No date: History of kidney stones No date: HLD (hyperlipidemia) No date: Hypertension 03/05/2021: Infrarenal abdominal aortic aneurysm (AAA) without  rupture (HCC)     Comment:  a.) CT abd/pelvis; measured 3.2 cm. No date: Myocardial infarction [facility]) No date: PVD (peripheral vascular disease) ([place]) 12/27/2018: S/P CABG x 4     Comment:  a.) LIMA-LAD, RIMA-PDA, sequential LEFT radial artery to              OM1 and D1 03/14/2021: Sepsis ([place]) No date: Wears glasses  Past Surgical History: 07/30/2021: AV FISTULA PLACEMENT; Left  Comment:  Procedure: INSERTION OF ARTERIOVENOUS (AV) GORE-TEX               GRAFT ARM BRACHIAL ARTERY TO AXILLARY VEIN;  Surgeon:               Algernon Huxley, MD;  Location: [facility];  Service:               Vascular;  Laterality: Left; 12/31/2019: CAPD INSERTION; N/A     Comment:  Procedure: [place]  (CAPD) CATHETER;  Surgeon: Jules Husbands, MD;  Location: [facility];  Service: General;                Laterality: N/A; 04/10/2020: CAPD REMOVAL; N/A     Comment:  Procedure: LAPAROSCOPIC REVISION OF CONTINUOUS               AMBULATORY PERITONEAL DIALYSIS  (CAPD) CATHETER;                Surgeon:  Jules Husbands, MD;  Location: [facility];                Service: General;  Laterality: N/A; 12/27/2018: CORONARY ARTERY BYPASS GRAFT; N/A     Comment:  Procedure: CORONARY ARTERY BYPASS GRAFTING (CABG) X 4 ON              PUMP USING RIGHT & LEFT INTERNAL MAMMARY ARTERY LEFT               RADIAL ARTERY ENDOSCOPICALLY HARVESTED;  Surgeon: [name], MD;  Location: [facility];  Service: Open Heart               Surgery;  Laterality: N/A; 05/15/2019: CYSTOSCOPY W/ RETROGRADES; Bilateral     Comment:  Procedure: CYSTOSCOPY WITH RETROGRADE PYELOGRAM;                Surgeon: Abbie Sons, MD;  Location: [facility];                Service: Urology;  Laterality: Bilateral; 05/15/2019: CYSTOSCOPY WITH BIOPSY; N/A     Comment:  Procedure: CYSTOSCOPY WITH bladder BIOPSY;  Surgeon:               Abbie Sons, MD;  Location: [facility];  Service:               Urology;  Laterality: N/A; 12/28/2019: DIALYSIS/PERMA CATHETER INSERTION; N/A     Comment:  Procedure: DIALYSIS/PERMA CATHETER INSERTION;  Surgeon:               Algernon Huxley, MD;  Location: [facility];                Service: Cardiovascular;  Laterality: N/A; 03/18/2021: DIALYSIS/PERMA CATHETER INSERTION; N/A     Comment:  Procedure: DIALYSIS/PERMA CATHETER INSERTION;  Surgeon:               Algernon Huxley, MD;  Location: [facility];                Service: Cardiovascular;  Laterality: N/A; 06/02/2020: DIALYSIS/PERMA CATHETER REMOVAL; N/A     Comment:  Procedure: DIALYSIS/PERMA CATHETER REMOVAL;  Surgeon:  Algernon Huxley, MD;  Location: [facility];                Service: Cardiovascular;  Laterality: N/A; 04/10/2020: EXCHANGE OF A DIALYSIS CATHETER; Right     Comment:  Procedure: EXCHANGE OF A DIALYSIS CATHETER;  Surgeon:               Jules Husbands, MD;  Location: [facility];  Service:               General;  Laterality: Right; 01/20/2021: INCISIONAL HERNIA REPAIR     Comment:  Procedure:  HERNIA REPAIR INCISIONAL;  Surgeon: Olean Ree, MD;  Location: [facility];  Service: General;; 04/07/2020: IR IMAGE GUIDED DRAINAGE PERCUT CATH  PERITONEAL RETROPERIT 04/16/2021: LAPAROSCOPY; N/A     Comment:  Procedure: LAPAROSCOPY DIAGNOSTIC;  Surgeon: Benjamine Sprague, DO;  Location: [facility];  Service: General;                Laterality: N/A; 12/20/2018: LEFT HEART CATH AND CORONARY ANGIOGRAPHY; Left     Comment:  Procedure: LEFT HEART CATH AND CORONARY ANGIOGRAPHY;                Surgeon: Isaias Cowman, MD;  Location: [place] [facility];  Service: Cardiovascular;  Laterality:               Left; 12/27/2018: RADIAL ARTERY HARVEST; Left     Comment:  Procedure: ENDOSCOPIC RADIAL ARTERY HARVEST;  Surgeon:               [name], MD;  Location: [facility];  Service: Open               Heart Surgery;  Laterality: Left; 03/20/2021: REMOVAL OF A DIALYSIS CATHETER; Left     Comment:  Procedure: REMOVAL OF A PD CATHETER;  Surgeon: Algernon Huxley, MD;  Location: [facility];  Service: Vascular;                Laterality: Left; 12/27/2018: TEE WITHOUT CARDIOVERSION; N/A     Comment:  Procedure: TRANSESOPHAGEAL ECHOCARDIOGRAM (TEE);                Surgeon: [name], MD;  Location: [place];                Service: Open Heart Surgery;  Laterality: N/A; 2019: TUMOR REMOVAL     Comment:  Bladder  BMI    Body Mass Index: 22.50 kg/m      Reproductive/Obstetrics negative OB ROS                           Anesthesia Physical Anesthesia Plan  ASA: 3  Anesthesia Plan: MAC   Post-op Pain Management: Regional block*   Induction:   PONV Risk Score and Plan: 1 and Ondansetron and Midazolam  Airway Management Planned: Natural Airway and Simple Face Mask  Additional Equipment:   Intra-op Plan:   Post-operative Plan:   Informed Consent: I have reviewed the patients History and Physical, chart, labs and  discussed the procedure including the risks, benefits and alternatives for  the proposed anesthesia with the patient or authorized representative who has indicated his/her understanding and acceptance.     Dental Advisory Given  Plan Discussed with: Anesthesiologist, CRNA and Surgeon  Anesthesia Plan Comments:        Anesthesia Quick Evaluation

## 2021-09-10 NOTE — Progress Notes (Signed)
Perioperative Services  Pre-Admission/Anesthesia Testing Clinical Review  Date: 09/10/21  Patient Demographics:  Name: Anthony Hess DOB:   09-17-1955 MRN:   [id number]  Planned Surgical Procedure(s):    Case: [id number] Date/Time: 09/11/21 0715   Procedure: Excision of infected AV graft (Left)   Anesthesia type: General   Pre-op diagnosis: ESRD   Location: [facility] OR ROOM 09 / [place] [facility] FOR ANESTHESIA GROUP   Surgeons: Katha Cabal, MD   NOTE: Available PAT nursing documentation and vital signs have been reviewed. Clinical nursing staff has updated patient's PMH/PSHx, current medication list, and drug allergies/intolerances to ensure comprehensive history available to assist in medical decision making as it pertains to the aforementioned surgical procedure and anticipated anesthetic course. Extensive review of available clinical information performed. Anthony Hess PMH and PSHx updated with any diagnoses/procedures that  may have been inadvertently omitted during his intake with the pre-admission testing department's nursing staff.  Clinical Discussion:  Anthony Hess is a 66 y.o. male who is submitted for pre-surgical anesthesia review and clearance prior to him undergoing the above procedure.Patient is a Former Smoker (quit 01/2019). Pertinent PMH includes: CAD (s/p CABG x 4), MI, DCM, HFrEF, PVD, infrarenal AAA, aortic atherosclerosis, BILATERAL carotid artery stenosis, HTN, HLD, ESRD (on hemodialysis), bladder cancer.  Patient is followed by cardiology Nehemiah Massed, MD). He was last seen in the cardiology clinic on 07/29/2021. Full note pending dictation/completion at time of consult. Patient with a past medical history significant for cardiovascular diagnoses.  Patient reported history of MI in the past, however details regarding this cardiovascular event unknown at time of consult.  Patient with significant angina back in 11/2018.  Given the degree and of his symptoms, patient underwent  cardiovascular work-up.  Patient underwent myocardial perfusion imaging study on 12/01/2018 revealing a severely reduced left ventricular systolic function with an EF of <30%.  Moderate perfusion abnormality noted in the basal anterior, basal anteroseptal, basal anterolateral, mid anterior, mid anteroseptal, anterolateral, apical anterior, and apex locations.  Findings felt to be consistent with prior myocardial infarction with peri-infarct ischemia.  TTE performed on 12/05/2018 revealed a moderately reduced left ventricular systolic function with an EF of 40-45%.  There was mild LVH.  Anterior, apical, and septal hypokinesis noted.  RVSF normal.  There was mild tricuspid valve regurgitation.  Aortic valve sclerosis present with no evidence of stenosis.  Diagnostic left heart catheterization was performed on 12/20/2018 revealing multivessel CAD; 50% OM1, 40% OM2, 95% ostial to proximal LAD, 75% ostial proximal LCx, 40% mid LM, 70% D1, 60% mid RCA-1, and 50% mid RCA-2.  Patient was referred to CVTS for consultation regarding CABG procedure.  Patient underwent four-vessel CABG procedure on 12/27/2018 at [place], RIMA-PDA, and sequential LEFT radial artery graft-OM1-D1 bypass grafts were placed.  Most recent TTE was performed on 12/28/2019 revealing a severely reduced left ventricular systolic function with an EF of 20-25%.  There were no regional wall motion abnormalities.  Left ventricular cavity moderately dilated. Diastolic Doppler parameters consistent with abnormal relaxation (G1DD). RVSF normal.  There was moderate mitral and mild to moderate aortic valve stenosis.  There was no significant transvalvular gradient to suggest stenosis.  Patient underwent routine CT imaging of the abdomen pelvis without contrast on 03/05/2021 to further assess abdominal pain in the setting of his peritoneal dialysis with cloudy dialysate.  Incidental finding made of a 3.2 cm infrarenal abdominal  aortic aneurysm.  Heart failure and blood pressure well controlled on currently prescribed beta-blocker and ARB/ANRi Delene Loll)  regimen; blood pressure documented at 131/86.  Patient is on a statin for his HLD and further ASCVD prevention.  He is not diabetic.  Patient compliant with hemodialysis regimen. Functional capacity, as defined by DASI, is documented as being >/= 4 METS. No changes were made to his medication regimen.  Patient follow-up with outpatient cardiology in 1 year or sooner if needed.  Anthony Hess is scheduled for the EXCISION OF INFECTED AV GRAFT (LEFT) on 09/11/2021 with Dr. Hortencia Pilar, MD. Given patient's past medical history significant for cardiovascular diagnoses, presurgical cardiac clearance was sought by the  PAT team.  Patient previously cleared for procedure back in 07/2021.  Given the length of time since 10% back clearance was issued, and in the absence of further cardiovascular/cardiopulmonary issues, clearance extended to cover this procedure as well.  Per cardiology, "the patient is at the lowest risk possible for perioperative cardiovascular complications with the planned procedure.  The overall risk his procedure is low (<1%).  Currently has no evidence active and/or significant angina and/or congestive heart failure. Patient may proceed to surgery without restriction or need for further cardiovascular testing and an overall LOW risk". The patient has been instructed to continue his daily low-dose ASA throughout his perioperative course.  Patient denies previous perioperative complications with anesthesia in the past. In review of the available records, it is noted that patient underwent a general anesthetic course here at [facility] (ASA IV) in 07/2021 without documented complications.      09/07/2021   11:45 AM 09/07/2021   10:58 AM 09/03/2021   11:18 AM  Vitals with BMI  Height  '5\' 3"'$    Weight  127 lbs 123 lbs  BMI  18.8    Systolic 416 606 301  Diastolic 601 093 235  Pulse  66 75    Providers/Specialists:   NOTE: Primary physician provider listed below. Patient may have been seen by APP or partner within same practice.   PROVIDER ROLE / SPECIALTY LAST OV  Schnier, Dolores Lory, MD Vascular Surgery (Surgeon) 09/03/2021  Patient, No Pcp Per Primary Care Provider ???  Serafina Royals, MD Cardiology 07/29/2021  Anthonette Legato, MD Nephrology 06/23/2021   Allergies:  Patient has no known allergies.  Current Home Medications:   No current facility-administered medications for this encounter.    aspirin EC 81 MG tablet   atorvastatin (LIPITOR) 40 MG tablet   calcitRIOL (ROCALTROL) 0.5 MCG capsule   metoprolol succinate (TOPROL-XL) 50 MG 24 hr tablet   multivitamin (RENA-VIT) TABS tablet   Nutritional Supplements (FEEDING SUPPLEMENT, NEPRO CARB STEADY,) LIQD   sacubitril-valsartan (ENTRESTO) 24-26 MG   Vitamin D, Ergocalciferol, (DRISDOL) 1.25 MG (50000 UNIT) CAPS capsule   zinc sulfate 220 (50 Zn) MG capsule   History:   Past Medical History:  Diagnosis Date   Aortic atherosclerosis (HCC)    Bilateral carotid artery disease (HCC)    Bladder cancer ([place])    Coronary artery disease 12/20/2018   a.) LHC 12/20/2018: 50% OM1, 40% OM2, 95% o-pLAD, 75% o=pLCx, 40% mLM, 70% D1, 60% mRCA-1, 50% mRCA-2; refer to CVTS. b.) [facility]v CABG at [facility] on 12/27/2018: LIMA-LAD, RIMA-PDA, seg LRA-OM1-D1   DCM (dilated cardiomyopathy) ([place]) 12/05/2018   a.) TTE 12/05/2018: EF 40-45%. b.) TTE 12/28/2019: EF 20-25%.   ESRD (end stage renal disease) ([place])    a.) T-Th-Sat   HFrEF (heart failure with reduced ejection fraction) ([place]) 12/05/2018   a.) TTE 12/05/2018: EF 40-45%; mild LVH; ant/apical/sep HK; mild  TR . b.) TTE 12/28/2019: EF 20-25%; mod LVH; mod MR/AR; G1DD.   History of 2019 novel coronavirus disease (COVID-19) 04/08/2021   History of kidney stones    HLD (hyperlipidemia)    Hypertension    Infrarenal abdominal  aortic aneurysm (AAA) without rupture ([place]) 03/05/2021   a.) CT abd/pelvis; measured 3.2 cm.   Myocardial infarction [facility])    PVD (peripheral vascular disease) ([place])    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   a.) LIMA-LAD, RIMA-PDA, sequential LEFT radial artery to OM1 and D1   Sepsis ([place]) 03/14/2021   Wears glasses    Past Surgical History:  Procedure Laterality Date   AV FISTULA PLACEMENT Left 07/30/2021   Procedure: INSERTION OF ARTERIOVENOUS (AV) GORE-TEX GRAFT ARM BRACHIAL ARTERY TO AXILLARY VEIN;  Surgeon: Algernon Huxley, MD;  Location: [facility] ORS;  Service: Vascular;  Laterality: Left;   CAPD INSERTION N/A 12/31/2019   Procedure: LAPAROSCOPIC INSERTION CONTINUOUS AMBULATORY PERITONEAL DIALYSIS  (CAPD) CATHETER;  Surgeon: Jules Husbands, MD;  Location: [facility] ORS;  Service: General;  Laterality: N/A;   CAPD REMOVAL N/A 04/10/2020   Procedure: LAPAROSCOPIC REVISION OF CONTINUOUS AMBULATORY PERITONEAL DIALYSIS  (CAPD) CATHETER;  Surgeon: Jules Husbands, MD;  Location: [facility] ORS;  Service: General;  Laterality: N/A;   CORONARY ARTERY BYPASS GRAFT N/A 12/27/2018   Procedure: CORONARY ARTERY BYPASS GRAFTING (CABG) X 4 ON PUMP USING RIGHT & LEFT INTERNAL MAMMARY ARTERY LEFT RADIAL ARTERY ENDOSCOPICALLY HARVESTED;  Surgeon: [name], MD;  Location: [place];  Service: Open Heart Surgery;  Laterality: N/A;   CYSTOSCOPY W/ RETROGRADES Bilateral 05/15/2019   Procedure: CYSTOSCOPY WITH RETROGRADE PYELOGRAM;  Surgeon: Abbie Sons, MD;  Location: [facility] ORS;  Service: Urology;  Laterality: Bilateral;   CYSTOSCOPY WITH BIOPSY N/A 05/15/2019   Procedure: CYSTOSCOPY WITH bladder BIOPSY;  Surgeon: Abbie Sons, MD;  Location: [facility] ORS;  Service: Urology;  Laterality: N/A;   DIALYSIS/PERMA CATHETER INSERTION N/A 12/28/2019   Procedure: DIALYSIS/PERMA CATHETER INSERTION;  Surgeon: Algernon Huxley, MD;  Location: [place] CV LAB;  Service: Cardiovascular;  Laterality: N/A;   DIALYSIS/PERMA CATHETER INSERTION N/A  03/18/2021   Procedure: DIALYSIS/PERMA CATHETER INSERTION;  Surgeon: Algernon Huxley, MD;  Location: [place] CV LAB;  Service: Cardiovascular;  Laterality: N/A;   DIALYSIS/PERMA CATHETER REMOVAL N/A 06/02/2020   Procedure: DIALYSIS/PERMA CATHETER REMOVAL;  Surgeon: Algernon Huxley, MD;  Location: [place] CV LAB;  Service: Cardiovascular;  Laterality: N/A;   EXCHANGE OF A DIALYSIS CATHETER Right 04/10/2020   Procedure: EXCHANGE OF A DIALYSIS CATHETER;  Surgeon: Jules Husbands, MD;  Location: [facility] ORS;  Service: General;  Laterality: Right;   [place]  01/20/2021   Procedure: HERNIA REPAIR INCISIONAL;  Surgeon: Olean Ree, MD;  Location: [facility] ORS;  Service: General;;   IR IMAGE GUIDED DRAINAGE PERCUT CATH  PERITONEAL RETROPERIT  04/07/2020   LAPAROSCOPY N/A 04/16/2021   Procedure: LAPAROSCOPY DIAGNOSTIC;  Surgeon: Benjamine Sprague, DO;  Location: [facility] ORS;  Service: General;  Laterality: N/A;   LEFT HEART CATH AND CORONARY ANGIOGRAPHY Left 12/20/2018   Procedure: LEFT HEART CATH AND CORONARY ANGIOGRAPHY;  Surgeon: Isaias Cowman, MD;  Location: [place] CV LAB;  Service: Cardiovascular;  Laterality: Left;   RADIAL ARTERY HARVEST Left 12/27/2018   Procedure: ENDOSCOPIC RADIAL ARTERY HARVEST;  Surgeon: [name], MD;  Location: [place];  Service: Open Heart Surgery;  Laterality: Left;   REMOVAL OF A DIALYSIS CATHETER Left 03/20/2021   Procedure: REMOVAL OF  A PD CATHETER;  Surgeon: Algernon Huxley, MD;  Location: [facility] ORS;  Service: Vascular;  Laterality: Left;   TEE WITHOUT CARDIOVERSION N/A 12/27/2018   Procedure: TRANSESOPHAGEAL ECHOCARDIOGRAM (TEE);  Surgeon: [name], MD;  Location: [place];  Service: Open Heart Surgery;  Laterality: N/A;   TUMOR REMOVAL  2019   Bladder   Family History  Family history unknown: Yes   Social History   Tobacco Use   Smoking status: Former    Packs/day: 0.25    Types: Cigarettes    Quit date: 02/19/2019    Years since  quitting: 2.5   Smokeless tobacco: Never  Vaping Use   Vaping Use: Never used  Substance Use Topics   Alcohol use: Never   Drug use: Never    Pertinent Clinical Results:  LABS: Labs reviewed: Acceptable for surgery.  Lab Results  Component Value Date   WBC 11.1 (H) 08/30/2021   HGB 12.0 (L) 08/30/2021   HCT 40.7 08/30/2021   MCV 94.0 08/30/2021   PLT 216 08/30/2021   Lab Results  Component Value Date   NA 146 (H) 08/30/2021   K 5.3 (H) 08/30/2021   CO2 20 (L) 08/30/2021   GLUCOSE 98 08/30/2021   BUN 42 (H) 08/30/2021   CREATININE 6.29 (H) 08/30/2021   CALCIUM 7.1 (L) 08/30/2021   GFRNONAA 9 (L) 08/30/2021    ECG: Date: 07/27/2021 Time ECG obtained: 0835 AM Rate: 62 bpm Rhythm: normal sinus Axis (leads I and aVF): Left axis deviation Intervals: PR 154 ms. QRS 88 ms. QTc 479 ms. ST segment and T wave changes: No evidence of acute ST segment elevation or depression Comparison: Similar to previous tracing obtained on 04/08/2021   IMAGING / PROCEDURES: TRANSTHORACIC ECHOCARDIOGRAM performed on 12/28/2019 Left ventricular ejection fraction, by estimation, is 20 to 25%. The left ventricle has severely decreased function. The left ventricle has no regional wall motion abnormalities. The left ventricular internal cavity size was moderately dilated. Left ventricular diastolic parameters are consistent with Grade I diastolic dysfunction (impaired relaxation). Right ventricular systolic function is normal. The right ventricular size is normal.  The mitral valve is normal in structure. Moderate mitral valve regurgitation. No evidence of mitral stenosis.  The aortic valve is normal in structure. Aortic valve regurgitation is mild to moderate. No aortic stenosis is present.  The inferior vena cava is normal in size with greater than 50% respiratory variability, suggesting right atrial pressure of 3 mmHg.   CORONARY ARTERY BYPASS GRAFTING performed on 12/27/2018 Four-vessel CABG  procedure LIMA-LAD RIMA-PDA Sequential left radial artery (LRA) graft to OM1 and D1  LEFT HEART CATHETERIZATION AND CORONARY ANGIOGRAPHY performed on 12/20/2018 Moderately reduced left ventricular systolic function with dilated cardiomyopathy; LVEF 30-35% Multivessel CAD 50% stenosis of OM1 40% stenosis of OM 2 95% stenosis of ostial to proximal LAD 75% stenosis of ostial to proximal LCx 40% stenosis of mid LM 70% stenosis of D1 60% stenosis mid RCA-1 50% stenosis of mid RCA-2 Recommendations Refer to CVTS for consideration of CABG procedure    MYOCARDIAL PERFUSION IMAGING STUDY (LEXISCAN) performed on 12/01/2018 Severely reduced left ventricular systolic function with an EF of <30% Medium defect of moderate severity present in the basal anterior, basal anteroseptal, basal anterolateral, mid anterior, mid anteroseptal, mid anterolateral, apical anterior, and apex locations.  Findings consistent with prior myocardial infarction with peri-infarct ischemia No T wave inversion noted during stress Blood pressure demonstrated normal response to exercise This is a high risk study  Impression and  Plan:  Anthony Hess has been referred for pre-anesthesia review and clearance prior to him undergoing the planned anesthetic and procedural courses. Available labs, pertinent testing, and imaging results were personally reviewed by me. This patient has been appropriately cleared by cardiology with an overall LOW risk of significant perioperative cardiovascular complications.  Based on clinical review performed today (09/10/21), barring any significant acute changes in the patient's overall condition, it is anticipated that he will be able to proceed with the planned surgical intervention. Any acute changes in clinical condition may necessitate his procedure being postponed and/or cancelled. Patient will meet with anesthesia team (MD and/or CRNA) on the day of his procedure for preoperative  evaluation/assessment. Questions regarding anesthetic course will be fielded at that time.   Pre-surgical instructions were reviewed with the patient during his PAT appointment and questions were fielded by PAT clinical staff. Patient was advised that if any questions or concerns arise prior to his procedure then he should return a call to PAT and/or his surgeon's office to discuss.  Honor Loh, MSN, APRN, FNP-C, CEN [facility]  Peri-operative Services Nurse Practitioner Phone: [phone] Fax: [phone] 09/10/21 2:00 PM  NOTE: This note has been prepared using Dragon dictation software. Despite my best ability to proofread, there is always the potential that unintentional transcriptional errors may still occur from this process.

## 2021-09-11 ENCOUNTER — Ambulatory Visit
Admission: RE | Admit: 2021-09-11 | Discharge: 2021-09-11 | Disposition: A | Discharge status: Home / Self Care | Payer: Medicare Other | Attending: Vascular Surgery | Admitting: Vascular Surgery

## 2021-09-11 ENCOUNTER — Ambulatory Visit: Payer: Medicare Other

## 2021-09-11 ENCOUNTER — Ambulatory Visit: Payer: Medicare Other | Admitting: Urgent Care

## 2021-09-11 ENCOUNTER — Encounter
Admission: RE | Disposition: A | Discharge status: Home / Self Care | Payer: Self-pay | Source: Home / Self Care | Attending: Vascular Surgery

## 2021-09-11 ENCOUNTER — Encounter: Payer: Self-pay | Admitting: Vascular Surgery

## 2021-09-11 ENCOUNTER — Other Ambulatory Visit: Payer: Self-pay

## 2021-09-11 DIAGNOSIS — I739 Peripheral vascular disease, unspecified: Secondary | ICD-10-CM | POA: Diagnosis not present

## 2021-09-11 DIAGNOSIS — Z8551 Personal history of malignant neoplasm of bladder: Secondary | ICD-10-CM | POA: Insufficient documentation

## 2021-09-11 DIAGNOSIS — J449 Chronic obstructive pulmonary disease, unspecified: Secondary | ICD-10-CM | POA: Diagnosis not present

## 2021-09-11 DIAGNOSIS — I132 Hypertensive heart and chronic kidney disease with heart failure and with stage 5 chronic kidney disease, or end stage renal disease: Secondary | ICD-10-CM | POA: Diagnosis not present

## 2021-09-11 DIAGNOSIS — I4891 Unspecified atrial fibrillation: Secondary | ICD-10-CM | POA: Insufficient documentation

## 2021-09-11 DIAGNOSIS — Y832 Surgical operation with anastomosis, bypass or graft as the cause of abnormal reaction of the patient, or of later complication, without mention of misadventure at the time of the procedure: Secondary | ICD-10-CM | POA: Insufficient documentation

## 2021-09-11 DIAGNOSIS — I509 Heart failure, unspecified: Secondary | ICD-10-CM | POA: Insufficient documentation

## 2021-09-11 DIAGNOSIS — N186 End stage renal disease: Secondary | ICD-10-CM | POA: Diagnosis not present

## 2021-09-11 DIAGNOSIS — X58XXXA Exposure to other specified factors, initial encounter: Secondary | ICD-10-CM | POA: Diagnosis not present

## 2021-09-11 DIAGNOSIS — Z992 Dependence on renal dialysis: Secondary | ICD-10-CM | POA: Insufficient documentation

## 2021-09-11 DIAGNOSIS — Z87891 Personal history of nicotine dependence: Secondary | ICD-10-CM | POA: Insufficient documentation

## 2021-09-11 DIAGNOSIS — Z01818 Encounter for other preprocedural examination: Secondary | ICD-10-CM

## 2021-09-11 DIAGNOSIS — E785 Hyperlipidemia, unspecified: Secondary | ICD-10-CM | POA: Insufficient documentation

## 2021-09-11 DIAGNOSIS — Z951 Presence of aortocoronary bypass graft: Secondary | ICD-10-CM | POA: Diagnosis not present

## 2021-09-11 DIAGNOSIS — I2510 Atherosclerotic heart disease of native coronary artery without angina pectoris: Secondary | ICD-10-CM | POA: Diagnosis not present

## 2021-09-11 DIAGNOSIS — T827XXA Infection and inflammatory reaction due to other cardiac and vascular devices, implants and grafts, initial encounter: Secondary | ICD-10-CM | POA: Diagnosis present

## 2021-09-11 DIAGNOSIS — I252 Old myocardial infarction: Secondary | ICD-10-CM | POA: Insufficient documentation

## 2021-09-11 HISTORY — PX: REVISION OF ARTERIOVENOUS GORETEX GRAFT: SHX6073

## 2021-09-11 LAB — TYPE AND SCREEN
ABO/RH(D): A POS
Antibody Screen: NEGATIVE

## 2021-09-11 LAB — POCT I-STAT, CHEM 8
BUN: 36 mg/dL — ABNORMAL HIGH (ref 8–23)
Calcium, Ion: 0.84 mmol/L — CL (ref 1.15–1.40)
Chloride: 106 mmol/L (ref 98–111)
Creatinine, Ser: 6 mg/dL — ABNORMAL HIGH (ref 0.61–1.24)
Glucose, Bld: 89 mg/dL (ref 70–99)
HCT: 43 % (ref 39.0–52.0)
Hemoglobin: 14.6 g/dL (ref 13.0–17.0)
Potassium: 5 mmol/L (ref 3.5–5.1)
Sodium: 142 mmol/L (ref 135–145)
TCO2: 27 mmol/L (ref 22–32)

## 2021-09-11 SURGERY — REVISION OF ARTERIOVENOUS GORETEX GRAFT
Anesthesia: General | Site: Arm Upper | Laterality: Left

## 2021-09-11 MED ORDER — BUPIVACAINE LIPOSOME 1.3 % IJ SUSP
INTRAMUSCULAR | Status: AC
  Filled 2021-09-11: qty 20

## 2021-09-11 MED ORDER — FAMOTIDINE 20 MG PO TABS
ORAL_TABLET | ORAL | Status: AC
  Administered 2021-09-11: 20 mg via ORAL
  Filled 2021-09-11: qty 1

## 2021-09-11 MED ORDER — CHLORHEXIDINE GLUCONATE 0.12 % MT SOLN
OROMUCOSAL | Status: AC
  Administered 2021-09-11: 15 mL via OROMUCOSAL
  Filled 2021-09-11: qty 15

## 2021-09-11 MED ORDER — ACETAMINOPHEN 10 MG/ML IV SOLN: 1000.0000 mg | Freq: Once | INTRAVENOUS | Status: DC | PRN

## 2021-09-11 MED ORDER — HYDRALAZINE HCL 20 MG/ML IJ SOLN
5.0000 mg | Freq: Once | INTRAMUSCULAR | Status: AC
  Administered 2021-09-11: 5 mg via INTRAVENOUS

## 2021-09-11 MED ORDER — BUPIVACAINE HCL (PF) 0.5 % IJ SOLN
INTRAMUSCULAR | Status: AC
  Filled 2021-09-11: qty 20

## 2021-09-11 MED ORDER — CEFAZOLIN SODIUM-DEXTROSE 2-4 GM/100ML-% IV SOLN
2.0000 g | INTRAVENOUS | Status: AC
  Administered 2021-09-11: 2 g via INTRAVENOUS

## 2021-09-11 MED ORDER — CHLORHEXIDINE GLUCONATE CLOTH 2 % EX PADS: 6.0000 | MEDICATED_PAD | Freq: Once | CUTANEOUS | Status: DC

## 2021-09-11 MED ORDER — PROPOFOL 10 MG/ML IV BOLUS
INTRAVENOUS | Status: AC
  Filled 2021-09-11: qty 20

## 2021-09-11 MED ORDER — FENTANYL CITRATE (PF) 100 MCG/2ML IJ SOLN
INTRAMUSCULAR | Status: AC
  Filled 2021-09-11: qty 2

## 2021-09-11 MED ORDER — FAMOTIDINE 20 MG PO TABS: 20.0000 mg | ORAL_TABLET | Freq: Once | ORAL | Status: AC

## 2021-09-11 MED ORDER — FENTANYL CITRATE (PF) 100 MCG/2ML IJ SOLN: 25.0000 ug | INTRAMUSCULAR | Status: DC | PRN

## 2021-09-11 MED ORDER — CHLORHEXIDINE GLUCONATE 0.12 % MT SOLN: 15.0000 mL | Freq: Once | OROMUCOSAL | Status: AC

## 2021-09-11 MED ORDER — HEPARIN SODIUM (PORCINE) 5000 UNIT/ML IJ SOLN
INTRAMUSCULAR | Status: AC
  Filled 2021-09-11: qty 1

## 2021-09-11 MED ORDER — SODIUM CHLORIDE 0.9 % IR SOLN
Status: DC | PRN
  Administered 2021-09-11: 1000 mL

## 2021-09-11 MED ORDER — MIDAZOLAM HCL 2 MG/2ML IJ SOLN
INTRAMUSCULAR | Status: AC
  Administered 2021-09-11: 1 mg via INTRAVENOUS
  Filled 2021-09-11: qty 2

## 2021-09-11 MED ORDER — HEPARIN 5000 UNITS IN NS 1000 ML (FLUSH)
INTRAMUSCULAR | Status: DC | PRN
  Administered 2021-09-11: 501 mL via INTRAMUSCULAR

## 2021-09-11 MED ORDER — MIDAZOLAM HCL 2 MG/2ML IJ SOLN
1.0000 mg | Freq: Once | INTRAMUSCULAR | Status: AC
  Administered 2021-09-11: 1 mg via INTRAVENOUS

## 2021-09-11 MED ORDER — MIDAZOLAM HCL 2 MG/2ML IJ SOLN: 1.0000 mg | Freq: Once | INTRAMUSCULAR | Status: AC

## 2021-09-11 MED ORDER — ONDANSETRON HCL 4 MG/2ML IJ SOLN
INTRAMUSCULAR | Status: AC
  Filled 2021-09-11: qty 2

## 2021-09-11 MED ORDER — OXYCODONE-ACETAMINOPHEN 5-325 MG PO TABS: 1.0000 | ORAL_TABLET | Freq: Four times a day (QID) | ORAL | 0 refills | Status: DC | PRN

## 2021-09-11 MED ORDER — SODIUM CHLORIDE 0.9 % IV SOLN: INTRAVENOUS | Status: DC

## 2021-09-11 MED ORDER — CEFAZOLIN SODIUM-DEXTROSE 2-4 GM/100ML-% IV SOLN
INTRAVENOUS | Status: AC
  Filled 2021-09-11: qty 100

## 2021-09-11 MED ORDER — BUPIVACAINE HCL (PF) 0.5 % IJ SOLN
INTRAMUSCULAR | Status: AC
  Filled 2021-09-11: qty 10

## 2021-09-11 MED ORDER — HEMOSTATIC AGENTS (NO CHARGE) OPTIME
TOPICAL | Status: DC | PRN
  Administered 2021-09-11: 1 via TOPICAL

## 2021-09-11 MED ORDER — DOXYCYCLINE HYCLATE 100 MG PO TABS
100.0000 mg | ORAL_TABLET | Freq: Two times a day (BID) | ORAL | Status: DC
  Filled 2021-09-11: qty 1

## 2021-09-11 MED ORDER — PROPOFOL 10 MG/ML IV BOLUS
INTRAVENOUS | Status: DC | PRN
  Administered 2021-09-11: 10 mg via INTRAVENOUS
  Administered 2021-09-11: 20 mg via INTRAVENOUS

## 2021-09-11 MED ORDER — ORAL CARE MOUTH RINSE: 15.0000 mL | Freq: Once | OROMUCOSAL | Status: AC

## 2021-09-11 MED ORDER — OXYCODONE HCL 5 MG PO TABS: 5.0000 mg | ORAL_TABLET | Freq: Once | ORAL | Status: DC | PRN

## 2021-09-11 MED ORDER — FENTANYL CITRATE (PF) 100 MCG/2ML IJ SOLN
INTRAMUSCULAR | Status: DC | PRN
Start: 2021-09-11 — End: 2021-09-11
  Administered 2021-09-11 (×4): 25 ug via INTRAVENOUS

## 2021-09-11 MED ORDER — OXYCODONE HCL 5 MG/5ML PO SOLN: 5.0000 mg | Freq: Once | ORAL | Status: DC | PRN

## 2021-09-11 MED ORDER — PROPOFOL 500 MG/50ML IV EMUL
INTRAVENOUS | Status: DC | PRN
  Administered 2021-09-11: 75 ug/kg/min via INTRAVENOUS

## 2021-09-11 MED ORDER — HYDRALAZINE HCL 20 MG/ML IJ SOLN
INTRAMUSCULAR | Status: AC
  Filled 2021-09-11: qty 1

## 2021-09-11 MED ORDER — PROPOFOL 10 MG/ML IV BOLUS
INTRAVENOUS | Status: AC
  Filled 2021-09-11: qty 40

## 2021-09-11 MED ORDER — BUPIVACAINE HCL (PF) 0.5 % IJ SOLN
INTRAMUSCULAR | Status: DC | PRN
  Administered 2021-09-11: 5 mL
  Administered 2021-09-11: 20 mL

## 2021-09-11 MED ORDER — PHENYLEPHRINE HCL-NACL 20-0.9 MG/250ML-% IV SOLN
INTRAVENOUS | Status: AC
  Filled 2021-09-11: qty 250

## 2021-09-11 MED ORDER — PROMETHAZINE HCL 25 MG/ML IJ SOLN: 6.2500 mg | INTRAMUSCULAR | Status: DC | PRN

## 2021-09-11 MED ORDER — BUPIVACAINE HCL (PF) 0.5 % IJ SOLN
INTRAMUSCULAR | Status: AC
  Filled 2021-09-11: qty 30

## 2021-09-11 MED ORDER — LIDOCAINE HCL (PF) 2 % IJ SOLN
INTRAMUSCULAR | Status: AC
  Filled 2021-09-11: qty 5

## 2021-09-11 SURGICAL SUPPLY — 74 items
ADH SKN CLS APL DERMABOND .7 (GAUZE/BANDAGES/DRESSINGS) ×1
APL PRP STRL LF DISP 70% ISPRP (MISCELLANEOUS) ×1
APPLIER CLIP 11 MED OPEN (CLIP) IMPLANT
APPLIER CLIP 9.375 SM OPEN (CLIP) IMPLANT
APR CLP MED 11 20 MLT OPN (CLIP)
APR CLP SM 9.3 20 MLT OPN (CLIP)
BAG DECANTER FOR FLEXI CONT (MISCELLANEOUS) ×2 IMPLANT
BLADE SURG 15 STRL LF DISP TIS (BLADE) ×1 IMPLANT
BLADE SURG 15 STRL SS (BLADE) ×2
BLADE SURG SZ11 CARB STEEL (BLADE) ×2 IMPLANT
BOOT SUTURE AID YELLOW STND (SUTURE) ×2 IMPLANT
BRUSH SCRUB EZ  4% CHG (MISCELLANEOUS) ×1
BRUSH SCRUB EZ 4% CHG (MISCELLANEOUS) ×1 IMPLANT
CHLORAPREP W/TINT 26 (MISCELLANEOUS) ×2 IMPLANT
CLIP APPLIE 11 MED OPEN (CLIP) IMPLANT
CLIP APPLIE 9.375 SM OPEN (CLIP) IMPLANT
DERMABOND ADVANCED (GAUZE/BANDAGES/DRESSINGS) ×1
DERMABOND ADVANCED .7 DNX12 (GAUZE/BANDAGES/DRESSINGS) ×1 IMPLANT
DRESSING SURGICEL FIBRLLR 1X2 (HEMOSTASIS) ×1 IMPLANT
DRSG SURGICEL FIBRILLAR 1X2 (HEMOSTASIS) ×2 IMPLANT
ELECT CAUTERY BLADE 6.4 (BLADE) ×2 IMPLANT
ELECT REM PT RETURN 9FT ADLT (ELECTROSURGICAL) ×2 IMPLANT
ELECTRODE REM PT RTRN 9FT ADLT (ELECTROSURGICAL) ×1 IMPLANT
GAUZE 4X4 16PLY ~~LOC~~+RFID DBL (SPONGE) ×1 IMPLANT
GAUZE PACKING IODOFORM 1/2 (PACKING) ×1 IMPLANT
GAUZE SPONGE 4X4 12PLY STRL (GAUZE/BANDAGES/DRESSINGS) ×1 IMPLANT
GEL ULTRASOUND 20GR AQUASONIC (MISCELLANEOUS) ×1 IMPLANT
GLOVE SURG SYN 8.0 (GLOVE) ×2 IMPLANT
GLOVE SURG SYN 8.0 PF PI (GLOVE) ×1 IMPLANT
GOWN STRL REUS W/ TWL LRG LVL3 (GOWN DISPOSABLE) ×1 IMPLANT
GOWN STRL REUS W/ TWL XL LVL3 (GOWN DISPOSABLE) ×1 IMPLANT
GOWN STRL REUS W/TWL LRG LVL3 (GOWN DISPOSABLE) ×2
GOWN STRL REUS W/TWL XL LVL3 (GOWN DISPOSABLE) ×2
IV NS 100ML SINGLE PACK (IV SOLUTION) IMPLANT
IV NS 500ML (IV SOLUTION) ×2
IV NS 500ML BAXH (IV SOLUTION) ×1 IMPLANT
KIT TURNOVER KIT A (KITS) ×2 IMPLANT
LABEL OR SOLS (LABEL) ×2 IMPLANT
LOOP RED MAXI  1X406MM (MISCELLANEOUS) ×1
LOOP VESSEL MAXI 1X406 RED (MISCELLANEOUS) ×1 IMPLANT
LOOP VESSEL MINI 0.8X406 BLUE (MISCELLANEOUS) ×2 IMPLANT
LOOPS BLUE MINI 0.8X406MM (MISCELLANEOUS) ×2
MANIFOLD NEPTUNE II (INSTRUMENTS) ×2 IMPLANT
NDL FILTER BLUNT 18X1 1/2 (NEEDLE) ×1 IMPLANT
NEEDLE FILTER BLUNT 18X 1/2SAF (NEEDLE) ×1
NEEDLE FILTER BLUNT 18X1 1/2 (NEEDLE) ×1 IMPLANT
NS IRRIG 1000ML POUR BTL (IV SOLUTION) ×1 IMPLANT
NS IRRIG 500ML POUR BTL (IV SOLUTION) ×1 IMPLANT
PACK EXTREMITY ARMC (MISCELLANEOUS) ×2 IMPLANT
PAD PREP 24X41 OB/GYN DISP (PERSONAL CARE ITEMS) ×2 IMPLANT
SPIKE FLUID TRANSFER (MISCELLANEOUS) ×2 IMPLANT
STAPLER SKIN PROX 35W (STAPLE) ×1 IMPLANT
STOCKINETTE 48X4 2 PLY STRL (GAUZE/BANDAGES/DRESSINGS) ×1 IMPLANT
STOCKINETTE STRL 3IN 960336 (SOFTGOODS) ×1 IMPLANT
STOCKINETTE STRL 4IN 9604848 (GAUZE/BANDAGES/DRESSINGS) IMPLANT
SUT ETHIBOND 0 (SUTURE) ×1 IMPLANT
SUT MNCRL+ 5-0 UNDYED PC-3 (SUTURE) ×1 IMPLANT
SUT MONOCRYL 5-0 (SUTURE)
SUT PROLENE 5 0 RB 1 DA (SUTURE) ×1 IMPLANT
SUT PROLENE 6 0 BV (SUTURE) ×11 IMPLANT
SUT SILK 2 0 (SUTURE) ×2
SUT SILK 2-0 18XBRD TIE 12 (SUTURE) ×1 IMPLANT
SUT SILK 3 0 (SUTURE) ×2
SUT SILK 3-0 18XBRD TIE 12 (SUTURE) ×1 IMPLANT
SUT SILK 4 0 (SUTURE) ×2
SUT SILK 4-0 18XBRD TIE 12 (SUTURE) ×1 IMPLANT
SUT VIC AB 3-0 SH 27 (SUTURE) ×6
SUT VIC AB 3-0 SH 27X BRD (SUTURE) ×2 IMPLANT
SWAB CULTURE AMIES ANAERIB BLU (MISCELLANEOUS) ×1 IMPLANT
SYR 20ML LL LF (SYRINGE) ×2 IMPLANT
SYR 3ML LL SCALE MARK (SYRINGE) ×2 IMPLANT
SYR TOOMEY IRRIG 70ML (MISCELLANEOUS) IMPLANT
SYRINGE TOOMEY IRRIG 70ML (MISCELLANEOUS) IMPLANT
WATER STERILE IRR 500ML POUR (IV SOLUTION) ×1 IMPLANT

## 2021-09-11 NOTE — Interval H&P Note (Signed)
History and Physical Interval Note:  09/11/2021 7:18 AM  Anthony Hess  has presented today for surgery, with the diagnosis of ESRD.  The various methods of treatment have been discussed with the patient and family. After consideration of risks, benefits and other options for treatment, the patient has consented to  Procedure(s): Excisionof infected AV graft (Left) as a surgical intervention.  The patient's history has been reviewed, patient examined, no change in status, stable for surgery.  I have reviewed the patient's chart and labs.  Questions were answered to the patient's satisfaction.     Hortencia Pilar

## 2021-09-11 NOTE — Brief Op Note (Signed)
Dr. Wynetta Emery and nurse anesthesia have been notified of patient's BP's.

## 2021-09-11 NOTE — Anesthesia Procedure Notes (Signed)
Anesthesia Regional Block: Supraclavicular block (with axillary ring block ultrasound guided)   Pre-Anesthetic Checklist: , timeout performed,  Correct Patient, Correct Site, Correct Laterality,  Correct Procedure, Correct Position, site marked,  Risks and benefits discussed,  Surgical consent,  Pre-op evaluation,  At surgeon's request and post-op pain management  Laterality: Left  Prep: chloraprep       Needles:  Injection technique: Single-shot  Needle Type: Stimiplex     Needle Length: 9cm  Needle Gauge: 22     Additional Needles:   Procedures:,,,, ultrasound used (permanent image in chart),,    Narrative:  Start time: 09/11/2021 7:15 AM End time: 09/11/2021 7:25 AM Injection made incrementally with aspirations every 5 mL.  Performed by: Personally  Anesthesiologist: Iran Ouch, MD  Additional Notes: Patient consented for risk and benefits of nerve block including but not limited to nerve damage, failed block, bleeding and infection.  Patient voiced understanding.  Functioning IV was confirmed and monitors were applied.  Timeout done prior to procedure and prior to any sedation being given to the patient.  Patient confirmed procedure site prior to any sedation given to the patient. Sterile prep,hand hygiene and sterile gloves were used.  Minimal sedation used for procedure.  No paresthesia endorsed by patient during the procedure.  Negative aspiration and negative test dose prior to incremental administration of local anesthetic. The patient tolerated the procedure well with no immediate complications.

## 2021-09-11 NOTE — Transfer of Care (Signed)
Immediate Anesthesia Transfer of Care Note  Patient: Anthony Hess  Procedure(s) Performed: Excisionof infected AV graft (Left: Arm Upper)  Patient Location: PACU  Anesthesia Type:General  Level of Consciousness: awake, drowsy and patient cooperative  Airway & Oxygen Therapy: Patient Spontanous Breathing  Post-op Assessment: Report given to RN and Post -op Vital signs reviewed and stable  Post vital signs: Reviewed and stable  Last Vitals:  Vitals Value Taken Time  BP 200/126 09/11/21 1041  Temp    Pulse 74 09/11/21 1041  Resp 17 09/11/21 1041  SpO2 90 % 09/11/21 1041  Vitals shown include unvalidated device data.  Last Pain:  Vitals:   09/11/21 0615  TempSrc: Temporal  PainSc: 0-No pain         Complications: No notable events documented.

## 2021-09-11 NOTE — Discharge Instructions (Signed)

## 2021-09-11 NOTE — Anesthesia Postprocedure Evaluation (Signed)
Anesthesia Post Note  Patient: Anthony Hess  Procedure(s) Performed: Excisionof infected AV graft (Left: Arm Upper)  Patient location during evaluation: PACU Anesthesia Type: General Level of consciousness: awake and alert Pain management: pain level controlled Vital Signs Assessment: post-procedure vital signs reviewed and stable Respiratory status: spontaneous breathing, nonlabored ventilation, respiratory function stable and patient connected to nasal cannula oxygen Cardiovascular status: blood pressure returned to baseline and stable Postop Assessment: no apparent nausea or vomiting Anesthetic complications: no   No notable events documented.   Last Vitals:  Vitals:   09/11/21 1130 09/11/21 1140  BP: (!) 153/96 (!) 147/100  Pulse: 60 65  Resp: 13 13  Temp:    SpO2: 95% 97%    Last Pain:  Vitals:   09/11/21 1130  TempSrc:   PainSc: 0-No pain                 Dimas Millin

## 2021-09-11 NOTE — Op Note (Signed)
OPERATIVE NOTE   PROCEDURE: Removal of infected left brachial axillary AV graft Vein patch angioplasty of the brachial artery  PRE-OPERATIVE DIAGNOSIS: Infected left brachial axillary AV graft; end-stage renal disease on hemodialysis  POST-OPERATIVE DIAGNOSIS: Same  SURGEON: Katha Cabal, M.D. ASSISTANT(S): None  ANESTHESIA: general  ESTIMATED BLOOD LOSS: 50 cc  FINDING(S): 1.  Infected graft  SPECIMEN(S):  AV graft in segments  INDICATIONS:   Anthony Hess is a 66 y.o. male who presents with an infected AV access.  Given the prosthetic material this will require excision. The risks and benefits of been reviewed all questions are answered patient has agreed to proceed.  DESCRIPTION: After obtaining full informed written consent, the patient was brought back to the operating room and placed supine upon the operating table.  The patient received IV antibiotics prior to induction.  After obtaining adequate anesthesia, the patient's left arm was prepped and draped in the standard fashion appropriate time out is called.    The a linear incision overlying the brachial artery beginning just proximally to the antecubital fossa is created and the dissection is carried down to expose the arterial anastomosis.  There was extensive scar tissue in this dissection was quite difficult.  Additionally, the patient appears to have a high takeoff of the radial artery as this was encountered prior to encountering the true brachial artery.  The native artery is then dissected circumferentially proximally and distally and the suture line is located. In similar fashion the venous anastomosis is exposed.  A separate incision is created over the cephalic vein in the lateral arm.  A segment of approximately 2 cm in length of the cephalic vein is dissected free ligated proximally and distally with silk ties irrigated with sterile saline and then opened longitudinally to create a patch.  The graft is then  retracted and dissected circumferentially 0 Ethibond is then used to ligate the graft beyond the anastomosis. Hemostat is then used to control the graft just above the anastomosis and the graft is transected. Final dissection around the artery is then performed. Using a 15 blade scalpel the suture line is incised and the entire prosthetic material is removed from the artery.  After achieving an adequate intimal edge the vein patch is trimmed to an appropriate shape and applied to repair the arterial defect using running 6-0 Prolene. Flushing maneuvers were performed and flow was reestablished to the left hand. Suture line is inspected bleeding points are controlled with interrupted 6-0 Prolene as needed.  Attention is then turned to the venous portion where in similar fashion the graft is retracted oversewn with an 0 Ethibond and then transected. 15 blade scalpel was used to remove the graft at the suture line and subsequently using a 5-0 Prolene the vein is oversewn using a horizontal mattress suture followed by a running over and under. Flushing maneuvers were performed and flow was reestablished through the vein.  The graft is completely unincorporated a small transverse incision is made slightly more proximal than the midpoint.  The graft is identified grasped with a Claiborne Billings and then removed without difficulty.  A culture is obtained of the tract left by the graft.  The wounds are then irrigated Surgicel is placed and the wound is closed in layers over the repaired vessels using 3-0 Vicryl followed by staples.  Iodoform ribbon is placed in between staples in the axillary incision as well as the antecubital incision.  Bulky gauze dressing is then applied.  COMPLICATIONS: None  CONDITION: Good  Katha Cabal, M.D. [facility] Office: [phone]   09/11/2021, 10:41 AM

## 2021-09-13 NOTE — Progress Notes (Deleted)
Patient ID: Anthony Hess, male   DOB: 01-15-1956, 66 y.o.   MRN: [id number]  No chief complaint on file.   HPI Anthony Hess is a 66 y.o. male.    The patient is s/p excision of AV graft on 09/11/2021.   Past Medical History:  Diagnosis Date   Aortic atherosclerosis ([place])    Bilateral carotid artery disease ([place])    Bladder cancer ([place])    Coronary artery disease 12/20/2018   a.) LHC 12/20/2018: 50% OM1, 40% OM2, 95% o-pLAD, 75% o=pLCx, 40% mLM, 70% D1, 60% mRCA-1, 50% mRCA-2; refer to CVTS. b.) 4v CABG at [facility] on 12/27/2018: LIMA-LAD, RIMA-PDA, seg LRA-OM1-D1   DCM (dilated cardiomyopathy) ([place]) 12/05/2018   a.) TTE 12/05/2018: EF 40-45%. b.) TTE 12/28/2019: EF 20-25%.   ESRD (end stage renal disease) ([place])    a.) T-Th-Sat   HFrEF (heart failure with reduced ejection fraction) ([place]) 12/05/2018   a.) TTE 12/05/2018: EF 40-45%; mild LVH; ant/apical/sep HK; mild TR . b.) TTE 12/28/2019: EF 20-25%; mod LVH; mod MR/AR; G1DD.   History of 2019 novel coronavirus disease (COVID-19) 04/08/2021   History of kidney stones    HLD (hyperlipidemia)    Hypertension    Infrarenal abdominal aortic aneurysm (AAA) without rupture ([place]) 03/05/2021   a.) CT abd/pelvis; measured 3.2 cm.   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   a.) LIMA-LAD, RIMA-PDA, sequential LEFT radial artery to OM1 and D1   Sepsis ([place]) 03/14/2021   Wears glasses     Past Surgical History:  Procedure Laterality Date   AV FISTULA PLACEMENT Left 07/30/2021   Procedure: INSERTION OF ARTERIOVENOUS (AV) GORE-TEX GRAFT ARM BRACHIAL ARTERY TO AXILLARY VEIN;  Surgeon: Algernon Huxley, MD;  Location: [facility];  Service: Vascular;  Laterality: Left;   CAPD INSERTION N/A 12/31/2019   Procedure: LAPAROSCOPIC INSERTION CONTINUOUS AMBULATORY PERITONEAL DIALYSIS  (CAPD) CATHETER;  Surgeon: Jules Husbands, MD;  Location: [facility];  Service: General;  Laterality: N/A;   CAPD REMOVAL  N/A 04/10/2020   Procedure: LAPAROSCOPIC REVISION OF CONTINUOUS AMBULATORY PERITONEAL DIALYSIS  (CAPD) CATHETER;  Surgeon: Jules Husbands, MD;  Location: [facility];  Service: General;  Laterality: N/A;   CORONARY ARTERY BYPASS GRAFT N/A 12/27/2018   Procedure: CORONARY ARTERY BYPASS GRAFTING (CABG) X 4 ON PUMP USING RIGHT & LEFT INTERNAL MAMMARY ARTERY LEFT RADIAL ARTERY ENDOSCOPICALLY HARVESTED;  Surgeon: [name], MD;  Location: [place];  Service: Open Heart Surgery;  Laterality: N/A;   CYSTOSCOPY W/ RETROGRADES Bilateral 05/15/2019   Procedure: CYSTOSCOPY WITH RETROGRADE PYELOGRAM;  Surgeon: Abbie Sons, MD;  Location: [facility];  Service: Urology;  Laterality: Bilateral;   CYSTOSCOPY WITH BIOPSY N/A 05/15/2019   Procedure: CYSTOSCOPY WITH bladder BIOPSY;  Surgeon: Abbie Sons, MD;  Location: [facility];  Service: Urology;  Laterality: N/A;   DIALYSIS/PERMA CATHETER INSERTION N/A 12/28/2019   Procedure: DIALYSIS/PERMA CATHETER INSERTION;  Surgeon: Algernon Huxley, MD;  Location: [place] [facility];  Service: Cardiovascular;  Laterality: N/A;   DIALYSIS/PERMA CATHETER INSERTION N/A 03/18/2021   Procedure: DIALYSIS/PERMA CATHETER INSERTION;  Surgeon: Algernon Huxley, MD;  Location: [place] [facility];  Service: Cardiovascular;  Laterality: N/A;   DIALYSIS/PERMA CATHETER REMOVAL N/A 06/02/2020   Procedure: DIALYSIS/PERMA CATHETER REMOVAL;  Surgeon: Algernon Huxley, MD;  Location: [place] [facility];  Service: Cardiovascular;  Laterality: N/A;   EXCHANGE OF A DIALYSIS CATHETER Right  04/10/2020   Procedure: EXCHANGE OF A DIALYSIS CATHETER;  Surgeon: Jules Husbands, MD;  Location: [facility];  Service: General;  Laterality: Right;   [place]  01/20/2021   Procedure: HERNIA REPAIR INCISIONAL;  Surgeon: Olean Ree, MD;  Location: [facility];  Service: General;;   IR IMAGE GUIDED DRAINAGE PERCUT CATH  PERITONEAL RETROPERIT  04/07/2020   LAPAROSCOPY N/A 04/16/2021   Procedure: LAPAROSCOPY  DIAGNOSTIC;  Surgeon: Benjamine Sprague, DO;  Location: [facility];  Service: General;  Laterality: N/A;   LEFT HEART CATH AND CORONARY ANGIOGRAPHY Left 12/20/2018   Procedure: LEFT HEART CATH AND CORONARY ANGIOGRAPHY;  Surgeon: Isaias Cowman, MD;  Location: [place] [facility];  Service: Cardiovascular;  Laterality: Left;   RADIAL ARTERY HARVEST Left 12/27/2018   Procedure: ENDOSCOPIC RADIAL ARTERY HARVEST;  Surgeon: [name], MD;  Location: [place];  Service: Open Heart Surgery;  Laterality: Left;   REMOVAL OF A DIALYSIS CATHETER Left 03/20/2021   Procedure: REMOVAL OF A PD CATHETER;  Surgeon: Algernon Huxley, MD;  Location: [facility];  Service: Vascular;  Laterality: Left;   REVISION OF ARTERIOVENOUS GORETEX GRAFT Left 09/11/2021   Procedure: Excisionof infected AV graft;  Surgeon: Katha Cabal, MD;  Location: [facility];  Service: Vascular;  Laterality: Left;   TEE WITHOUT CARDIOVERSION N/A 12/27/2018   Procedure: TRANSESOPHAGEAL ECHOCARDIOGRAM (TEE);  Surgeon: [name], MD;  Location: [place];  Service: Open Heart Surgery;  Laterality: N/A;   TUMOR REMOVAL  2019   Bladder      No Known Allergies  Current Outpatient Medications  Medication Sig Dispense Refill   aspirin EC 81 MG tablet Take 81 mg by mouth daily.     atorvastatin (LIPITOR) 40 MG tablet Take 1 tablet (40 mg total) by mouth daily. (Patient taking differently: Take 40 mg by mouth every morning.) 30 tablet 1   calcitRIOL (ROCALTROL) 0.5 MCG capsule Take 0.5 mcg by mouth daily.     metoprolol succinate (TOPROL-XL) 50 MG 24 hr tablet Take 0.5 tablets (25 mg total) by mouth daily. Take with or immediately following a meal. (Patient taking differently: Take 25 mg by mouth every morning. Take with or immediately following a meal.) 30 tablet 1   multivitamin (RENA-VIT) TABS tablet Take 1 tablet by mouth at bedtime. 30 tablet 1   Nutritional Supplements (FEEDING SUPPLEMENT, NEPRO CARB STEADY,) LIQD Take 237 mLs by mouth 3  (three) times daily between meals.  0   oxyCODONE-acetaminophen (PERCOCET) 5-325 MG tablet Take 1-2 tablets by mouth every 6 (six) hours as needed for severe pain. 45 tablet 0   sacubitril-valsartan (ENTRESTO) 24-26 MG Take 1 tablet by mouth 2 (two) times daily. 60 tablet 1   Vitamin D, Ergocalciferol, (DRISDOL) 1.25 MG (50000 UNIT) CAPS capsule Take 1 capsule (50,000 Units total) by mouth every 7 (seven) days. (Patient taking differently: Take 50,000 Units by mouth every Saturday.) 5 capsule 1   zinc sulfate 220 (50 Zn) MG capsule Take 1 capsule (220 mg total) by mouth daily. 90 capsule 0   No current facility-administered medications for this visit.        Physical Exam There were no vitals taken for this visit. Gen:  WD/WN, NAD Skin: incision C/D/I     Assessment/Plan:  No problem-specific Assessment & Plan notes found for this encounter.      Hortencia Pilar 09/13/2021, 1:51 PM   This note was created with Dragon medical transcription system.  Any errors from dictation are  unintentional.

## 2021-09-14 ENCOUNTER — Telehealth (INDEPENDENT_AMBULATORY_CARE_PROVIDER_SITE_OTHER): Payer: Self-pay | Admitting: Vascular Surgery

## 2021-09-14 ENCOUNTER — Ambulatory Visit (INDEPENDENT_AMBULATORY_CARE_PROVIDER_SITE_OTHER): Payer: Medicare Other | Admitting: Vascular Surgery

## 2021-09-14 LAB — SURGICAL PATHOLOGY

## 2021-09-14 NOTE — Telephone Encounter (Signed)
Patient called in upset that Dr. Didn't call his wife after his surgery like he said he would. I let him know wife was able to come to visit he had scheduled today. He cancelled appt and is now schd for 09/28/2021. I let patient know provider was in surgery and had clinic today as well. But I would send a message voicing his concerns     Please call and advise

## 2021-09-16 LAB — AEROBIC/ANAEROBIC CULTURE W GRAM STAIN (SURGICAL/DEEP WOUND)

## 2021-09-28 ENCOUNTER — Encounter (INDEPENDENT_AMBULATORY_CARE_PROVIDER_SITE_OTHER): Payer: Self-pay | Admitting: Nurse Practitioner

## 2021-09-28 ENCOUNTER — Ambulatory Visit (INDEPENDENT_AMBULATORY_CARE_PROVIDER_SITE_OTHER): Payer: Medicare Other | Admitting: Nurse Practitioner

## 2021-09-28 VITALS — BP 163/90 | HR 56 | Resp 16 | Wt 127.8 lb

## 2021-09-28 DIAGNOSIS — N186 End stage renal disease: Secondary | ICD-10-CM

## 2021-09-28 MED ORDER — DOXYCYCLINE HYCLATE 100 MG PO CAPS: 100.0000 mg | ORAL_CAPSULE | Freq: Two times a day (BID) | ORAL | 0 refills | Status: DC

## 2021-09-29 ENCOUNTER — Encounter (INDEPENDENT_AMBULATORY_CARE_PROVIDER_SITE_OTHER): Payer: Self-pay | Admitting: Nurse Practitioner

## 2021-09-29 NOTE — Progress Notes (Signed)
Subjective:    Patient ID: Anthony Hess, male    DOB: [birth year], 66 y.o.   MRN: [id number] Chief Complaint  Patient presents with   Follow-up    [facility] follow up    The patient presents today for follow-up evaluation of her recent graft excision.  The incisions are well-healed except for the original open area of dehiscence from his graft.  This area has notable fibrinous exudate with some serous drainage noted.  The patient notes that he has more drainage and that it is foul-smelling and nature.  He denies any significant pain or fevers.    Review of Systems  Skin:  Positive for wound.       Objective:   Physical Exam Vitals reviewed.  HENT:     Head: Normocephalic.  Cardiovascular:     Rate and Rhythm: Normal rate.     Pulses:          Radial pulses are 2+ on the left side.  Pulmonary:     Effort: Pulmonary effort is normal.  Skin:    General: Skin is warm and dry.  Neurological:     Mental Status: He is alert and oriented to person, place, and time.  Psychiatric:        Mood and Affect: Mood normal.        Behavior: Behavior normal.        Thought Content: Thought content normal.        Judgment: Judgment normal.     BP (!) 163/90 (BP Location: Right Arm)   Pulse (!) 56   Resp 16   Wt 127 lb 12.8 oz (58 kg)   BMI 22.64 kg/m   Past Medical History:  Diagnosis Date   Aortic atherosclerosis ([facility])    Bilateral carotid artery disease ([facility])    Bladder cancer ([facility])    Coronary artery disease 12/20/2018   a.) LHC 12/20/2018: 50% OM1, 40% OM2, 95% o-pLAD, 75% o=pLCx, 40% mLM, 70% D1, 60% mRCA-1, 50% mRCA-2; refer to CVTS. b.) 4v CABG at [facility] on 12/27/2018: LIMA-LAD, RIMA-PDA, seg LRA-OM1-D1   DCM (dilated cardiomyopathy) ([place]) 12/05/2018   a.) TTE 12/05/2018: EF 40-45%. b.) TTE 12/28/2019: EF 20-25%.   ESRD (end stage renal disease) ([place])    a.) T-Th-Sat   HFrEF (heart failure with reduced ejection fraction) ([place]) 12/05/2018   a.) TTE 12/05/2018: EF 40-45%; mild  LVH; ant/apical/sep HK; mild TR . b.) TTE 12/28/2019: EF 20-25%; mod LVH; mod MR/AR; G1DD.   History of 2019 novel coronavirus disease (COVID-19) 04/08/2021   History of kidney stones    HLD (hyperlipidemia)    Hypertension    Infrarenal abdominal aortic aneurysm (AAA) without rupture ([place]) 03/05/2021   a.) CT abd/pelvis; measured 3.2 cm.   Myocardial infarction 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   a.) LIMA-LAD, RIMA-PDA, sequential LEFT radial artery to OM1 and D1   Sepsis ([place]) 03/14/2021   Wears glasses     Social History   Socioeconomic History   Marital status: Married    Spouse name: Not on file   Number of children: Not on file   Years of education: Not on file   Highest education level: Not on file  Occupational History   Not on file  Tobacco Use   Smoking status: Former    Packs/day: 0.25    Types: Cigarettes    Quit date: 02/19/2019    Years since quitting: 2.6  Smokeless tobacco: Never  Vaping Use   Vaping Use: Never used  Substance and Sexual Activity   Alcohol use: Never   Drug use: Never   Sexual activity: Yes  Other Topics Concern   Not on file  Social History Narrative   Not on file   Social Determinants of Health   Financial Resource Strain: Not on file  Food Insecurity: Not on file  Transportation Needs: Not on file  Physical Activity: Not on file  Stress: Not on file  Social Connections: Not on file  Intimate Partner Violence: Not on file    Past Surgical History:  Procedure Laterality Date   AV FISTULA PLACEMENT Left 07/30/2021   Procedure: INSERTION OF ARTERIOVENOUS (AV) GORE-TEX GRAFT ARM BRACHIAL ARTERY TO AXILLARY VEIN;  Surgeon: Algernon Huxley, MD;  Location: [facility];  Service: Vascular;  Laterality: Left;   CAPD INSERTION N/A 12/31/2019   Procedure: LAPAROSCOPIC INSERTION CONTINUOUS AMBULATORY PERITONEAL DIALYSIS  (CAPD) CATHETER;  Surgeon: Jules Husbands, MD;  Location: [facility];  Service:  General;  Laterality: N/A;   CAPD REMOVAL N/A 04/10/2020   Procedure: LAPAROSCOPIC REVISION OF CONTINUOUS AMBULATORY PERITONEAL DIALYSIS  (CAPD) CATHETER;  Surgeon: Jules Husbands, MD;  Location: [facility];  Service: General;  Laterality: N/A;   CORONARY ARTERY BYPASS GRAFT N/A 12/27/2018   Procedure: CORONARY ARTERY BYPASS GRAFTING (CABG) X 4 ON PUMP USING RIGHT & LEFT INTERNAL MAMMARY ARTERY LEFT RADIAL ARTERY ENDOSCOPICALLY HARVESTED;  Surgeon: [name], MD;  Location: [place];  Service: Open Heart Surgery;  Laterality: N/A;   CYSTOSCOPY W/ RETROGRADES Bilateral 05/15/2019   Procedure: CYSTOSCOPY WITH RETROGRADE PYELOGRAM;  Surgeon: Abbie Sons, MD;  Location: [facility];  Service: Urology;  Laterality: Bilateral;   CYSTOSCOPY WITH BIOPSY N/A 05/15/2019   Procedure: CYSTOSCOPY WITH bladder BIOPSY;  Surgeon: Abbie Sons, MD;  Location: [facility];  Service: Urology;  Laterality: N/A;   DIALYSIS/PERMA CATHETER INSERTION N/A 12/28/2019   Procedure: DIALYSIS/PERMA CATHETER INSERTION;  Surgeon: Algernon Huxley, MD;  Location: [place] [facility];  Service: Cardiovascular;  Laterality: N/A;   DIALYSIS/PERMA CATHETER INSERTION N/A 03/18/2021   Procedure: DIALYSIS/PERMA CATHETER INSERTION;  Surgeon: Algernon Huxley, MD;  Location: [place] [facility];  Service: Cardiovascular;  Laterality: N/A;   DIALYSIS/PERMA CATHETER REMOVAL N/A 06/02/2020   Procedure: DIALYSIS/PERMA CATHETER REMOVAL;  Surgeon: Algernon Huxley, MD;  Location: [place] [facility];  Service: Cardiovascular;  Laterality: N/A;   EXCHANGE OF A DIALYSIS CATHETER Right 04/10/2020   Procedure: EXCHANGE OF A DIALYSIS CATHETER;  Surgeon: Jules Husbands, MD;  Location: [facility];  Service: General;  Laterality: Right;   [place]  01/20/2021   Procedure: HERNIA REPAIR INCISIONAL;  Surgeon: Olean Ree, MD;  Location: [facility];  Service: General;;   IR IMAGE GUIDED DRAINAGE PERCUT CATH  PERITONEAL RETROPERIT  04/07/2020    LAPAROSCOPY N/A 04/16/2021   Procedure: LAPAROSCOPY DIAGNOSTIC;  Surgeon: Benjamine Sprague, DO;  Location: [facility];  Service: General;  Laterality: N/A;   LEFT HEART CATH AND CORONARY ANGIOGRAPHY Left 12/20/2018   Procedure: LEFT HEART CATH AND CORONARY ANGIOGRAPHY;  Surgeon: Isaias Cowman, MD;  Location: [place] [facility];  Service: Cardiovascular;  Laterality: Left;   RADIAL ARTERY HARVEST Left 12/27/2018   Procedure: ENDOSCOPIC RADIAL ARTERY HARVEST;  Surgeon: [name], MD;  Location: [place];  Service: Open Heart Surgery;  Laterality: Left;   REMOVAL OF A DIALYSIS CATHETER Left 03/20/2021   Procedure: REMOVAL OF  A PD CATHETER;  Surgeon: Algernon Huxley, MD;  Location: [facility];  Service: Vascular;  Laterality: Left;   REVISION OF ARTERIOVENOUS GORETEX GRAFT Left 09/11/2021   Procedure: Excisionof infected AV graft;  Surgeon: Katha Cabal, MD;  Location: [facility];  Service: Vascular;  Laterality: Left;   TEE WITHOUT CARDIOVERSION N/A 12/27/2018   Procedure: TRANSESOPHAGEAL ECHOCARDIOGRAM (TEE);  Surgeon: [name], MD;  Location: [place];  Service: Open Heart Surgery;  Laterality: N/A;   TUMOR REMOVAL  2019   Bladder    Family History  Family history unknown: Yes    No Known Allergies     Latest Ref Rng & Units 09/11/2021    6:24 AM 08/30/2021    2:16 PM 07/30/2021   11:41 AM  CBC  WBC 4.0 - 10.5 K/uL  11.1    Hemoglobin 13.0 - 17.0 g/dL 14.6  12.0  12.2   Hematocrit 39.0 - 52.0 % 43.0  40.7  36.0   Platelets 150 - 400 K/uL  216        CMP     Component Value Date/Time   NA 142 09/11/2021 0624   NA 143 01/04/2020 1542   K 5.0 09/11/2021 0624   CL 106 09/11/2021 0624   CO2 20 (L) 08/30/2021 1416   GLUCOSE 89 09/11/2021 0624   BUN 36 (H) 09/11/2021 0624   BUN 52 (H) 01/04/2020 1542   CREATININE 6.00 (H) 09/11/2021 0624   CREATININE 4.62 (H) 01/10/2019 1351   CALCIUM 7.1 (L) 08/30/2021 1416   PROT 4.8 (L) 04/09/2021 0352   PROT 5.7 (L) 01/04/2020 1542    ALBUMIN 2.3 (L) 04/16/2021 0800   ALBUMIN 3.5 (L) 01/04/2020 1542   AST 31 04/09/2021 0352   ALT 47 (H) 04/09/2021 0352   ALKPHOS 49 04/09/2021 0352   BILITOT 0.5 04/09/2021 0352   BILITOT <0.2 01/04/2020 1542   GFRNONAA 9 (L) 08/30/2021 1416   GFRAA 15 (L) 01/04/2020 1542     No results found.     Assessment & Plan:   1. ESRD (end stage renal disease) ([place]) Staples removed with Steri-Strips applied.  The patient does have some redness near the original open area.  We will send in doxycycline as the intraoperative culture showed MRSA.  We have also instructed the patient to utilize a wet-to-dry dressing change twice a day.  Will have her return in 2 weeks for wound evaluation.   Current Outpatient Medications on File Prior to Visit  Medication Sig Dispense Refill   aspirin EC 81 MG tablet Take 81 mg by mouth daily.     atorvastatin (LIPITOR) 40 MG tablet Take 1 tablet (40 mg total) by mouth daily. (Patient taking differently: Take 40 mg by mouth every morning.) 30 tablet 1   calcitRIOL (ROCALTROL) 0.5 MCG capsule Take 0.5 mcg by mouth daily.     metoprolol succinate (TOPROL-XL) 50 MG 24 hr tablet Take 0.5 tablets (25 mg total) by mouth daily. Take with or immediately following a meal. (Patient taking differently: Take 25 mg by mouth every morning. Take with or immediately following a meal.) 30 tablet 1   multivitamin (RENA-VIT) TABS tablet Take 1 tablet by mouth at bedtime. 30 tablet 1   Nutritional Supplements (FEEDING SUPPLEMENT, NEPRO CARB STEADY,) LIQD Take 237 mLs by mouth 3 (three) times daily between meals.  0   oxyCODONE-acetaminophen (PERCOCET) 5-325 MG tablet Take 1-2 tablets by mouth every 6 (six) hours as needed for severe pain. 45 tablet 0  sacubitril-valsartan (ENTRESTO) 24-26 MG Take 1 tablet by mouth 2 (two) times daily. 60 tablet 1   Vitamin D, Ergocalciferol, (DRISDOL) 1.25 MG (50000 UNIT) CAPS capsule Take 1 capsule (50,000 Units total) by mouth every 7 (seven)  days. (Patient taking differently: Take 50,000 Units by mouth every Saturday.) 5 capsule 1   zinc sulfate 220 (50 Zn) MG capsule Take 1 capsule (220 mg total) by mouth daily. 90 capsule 0   No current facility-administered medications on file prior to visit.    There are no Patient Instructions on file for this visit. No follow-ups on file.   Kris Hartmann, NP

## 2021-10-11 NOTE — Progress Notes (Deleted)
Patient ID: Anthony Hess, male   DOB: May 23, 1955, 65 y.o.   MRN: [id number]  No chief complaint on file.   HPI Anthony Hess is a 66 y.o. male.    PROCEDURE 09/11/2021: Removal of infected left brachial axillary AV graft Vein patch angioplasty of the brachial artery   Past Medical History:  Diagnosis Date   Aortic atherosclerosis (HCC)    Bilateral carotid artery disease (HCC)    Bladder cancer ([place])    Coronary artery disease 12/20/2018   a.) LHC 12/20/2018: 50% OM1, 40% OM2, 95% o-pLAD, 75% o=pLCx, 40% mLM, 70% D1, 60% mRCA-1, 50% mRCA-2; refer to CVTS. b.) 4v CABG at [facility] on 12/27/2018: LIMA-LAD, RIMA-PDA, seg LRA-OM1-D1   DCM (dilated cardiomyopathy) ([place]) 12/05/2018   a.) TTE 12/05/2018: EF 40-45%. b.) TTE 12/28/2019: EF 20-25%.   ESRD (end stage renal disease) ([place])    a.) T-Th-Sat   HFrEF (heart failure with reduced ejection fraction) ([place]) 12/05/2018   a.) TTE 12/05/2018: EF 40-45%; mild LVH; ant/apical/sep HK; mild TR . b.) TTE 12/28/2019: EF 20-25%; mod LVH; mod MR/AR; G1DD.   History of 2019 novel coronavirus disease (COVID-19) 04/08/2021   History of kidney stones    HLD (hyperlipidemia)    Hypertension    Infrarenal abdominal aortic aneurysm (AAA) without rupture ([place]) 03/05/2021   a.) CT abd/pelvis; measured 3.2 cm.   Myocardial infarction 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   a.) LIMA-LAD, RIMA-PDA, sequential LEFT radial artery to OM1 and D1   Sepsis ([place]) 03/14/2021   Wears glasses     Past Surgical History:  Procedure Laterality Date   AV FISTULA PLACEMENT Left 07/30/2021   Procedure: INSERTION OF ARTERIOVENOUS (AV) GORE-TEX GRAFT ARM BRACHIAL ARTERY TO AXILLARY VEIN;  Surgeon: Algernon Huxley, MD;  Location: [facility];  Service: Vascular;  Laterality: Left;   CAPD INSERTION N/A 12/31/2019   Procedure: LAPAROSCOPIC INSERTION CONTINUOUS AMBULATORY PERITONEAL DIALYSIS  (CAPD) CATHETER;  Surgeon: Jules Husbands, MD;   Location: [facility];  Service: General;  Laterality: N/A;   CAPD REMOVAL N/A 04/10/2020   Procedure: LAPAROSCOPIC REVISION OF CONTINUOUS AMBULATORY PERITONEAL DIALYSIS  (CAPD) CATHETER;  Surgeon: Jules Husbands, MD;  Location: [facility];  Service: General;  Laterality: N/A;   CORONARY ARTERY BYPASS GRAFT N/A 12/27/2018   Procedure: CORONARY ARTERY BYPASS GRAFTING (CABG) X 4 ON PUMP USING RIGHT & LEFT INTERNAL MAMMARY ARTERY LEFT RADIAL ARTERY ENDOSCOPICALLY HARVESTED;  Surgeon: [name], MD;  Location: [place];  Service: Open Heart Surgery;  Laterality: N/A;   CYSTOSCOPY W/ RETROGRADES Bilateral 05/15/2019   Procedure: CYSTOSCOPY WITH RETROGRADE PYELOGRAM;  Surgeon: Abbie Sons, MD;  Location: [facility];  Service: Urology;  Laterality: Bilateral;   CYSTOSCOPY WITH BIOPSY N/A 05/15/2019   Procedure: CYSTOSCOPY WITH bladder BIOPSY;  Surgeon: Abbie Sons, MD;  Location: [facility];  Service: Urology;  Laterality: N/A;   DIALYSIS/PERMA CATHETER INSERTION N/A 12/28/2019   Procedure: DIALYSIS/PERMA CATHETER INSERTION;  Surgeon: Algernon Huxley, MD;  Location: [place] CV LAB;  Service: Cardiovascular;  Laterality: N/A;   DIALYSIS/PERMA CATHETER INSERTION N/A 03/18/2021   Procedure: DIALYSIS/PERMA CATHETER INSERTION;  Surgeon: Algernon Huxley, MD;  Location: [place] CV LAB;  Service: Cardiovascular;  Laterality: N/A;   DIALYSIS/PERMA CATHETER REMOVAL N/A 06/02/2020   Procedure: DIALYSIS/PERMA CATHETER REMOVAL;  Surgeon: Algernon Huxley, MD;  Location: [place] [facility];  Service: Cardiovascular;  Laterality: N/A;  EXCHANGE OF A DIALYSIS CATHETER Right 04/10/2020   Procedure: EXCHANGE OF A DIALYSIS CATHETER;  Surgeon: Jules Husbands, MD;  Location: [facility];  Service: General;  Laterality: Right;   [place]  01/20/2021   Procedure: HERNIA REPAIR INCISIONAL;  Surgeon: Olean Ree, MD;  Location: [facility];  Service: General;;   IR IMAGE GUIDED DRAINAGE PERCUT CATH  PERITONEAL  RETROPERIT  04/07/2020   LAPAROSCOPY N/A 04/16/2021   Procedure: LAPAROSCOPY DIAGNOSTIC;  Surgeon: Benjamine Sprague, DO;  Location: [facility];  Service: General;  Laterality: N/A;   LEFT HEART CATH AND CORONARY ANGIOGRAPHY Left 12/20/2018   Procedure: LEFT HEART CATH AND CORONARY ANGIOGRAPHY;  Surgeon: Isaias Cowman, MD;  Location: [place] CV LAB;  Service: Cardiovascular;  Laterality: Left;   RADIAL ARTERY HARVEST Left 12/27/2018   Procedure: ENDOSCOPIC RADIAL ARTERY HARVEST;  Surgeon: [name], MD;  Location: [place];  Service: Open Heart Surgery;  Laterality: Left;   REMOVAL OF A DIALYSIS CATHETER Left 03/20/2021   Procedure: REMOVAL OF A PD CATHETER;  Surgeon: Algernon Huxley, MD;  Location: [facility];  Service: Vascular;  Laterality: Left;   REVISION OF ARTERIOVENOUS GORETEX GRAFT Left 09/11/2021   Procedure: Excisionof infected AV graft;  Surgeon: Katha Cabal, MD;  Location: [facility];  Service: Vascular;  Laterality: Left;   TEE WITHOUT CARDIOVERSION N/A 12/27/2018   Procedure: TRANSESOPHAGEAL ECHOCARDIOGRAM (TEE);  Surgeon: [name], MD;  Location: [place];  Service: Open Heart Surgery;  Laterality: N/A;   TUMOR REMOVAL  2019   Bladder      No Known Allergies  Current Outpatient Medications  Medication Sig Dispense Refill   aspirin EC 81 MG tablet Take 81 mg by mouth daily.     atorvastatin (LIPITOR) 40 MG tablet Take 1 tablet (40 mg total) by mouth daily. (Patient taking differently: Take 40 mg by mouth every morning.) 30 tablet 1   calcitRIOL (ROCALTROL) 0.5 MCG capsule Take 0.5 mcg by mouth daily.     doxycycline (VIBRAMYCIN) 100 MG capsule Take 1 capsule (100 mg total) by mouth 2 (two) times daily. 28 capsule 0   metoprolol succinate (TOPROL-XL) 50 MG 24 hr tablet Take 0.5 tablets (25 mg total) by mouth daily. Take with or immediately following a meal. (Patient taking differently: Take 25 mg by mouth every morning. Take with or immediately following a meal.)  30 tablet 1   multivitamin (RENA-VIT) TABS tablet Take 1 tablet by mouth at bedtime. 30 tablet 1   Nutritional Supplements (FEEDING SUPPLEMENT, NEPRO CARB STEADY,) LIQD Take 237 mLs by mouth 3 (three) times daily between meals.  0   oxyCODONE-acetaminophen (PERCOCET) 5-325 MG tablet Take 1-2 tablets by mouth every 6 (six) hours as needed for severe pain. 45 tablet 0   sacubitril-valsartan (ENTRESTO) 24-26 MG Take 1 tablet by mouth 2 (two) times daily. 60 tablet 1   Vitamin D, Ergocalciferol, (DRISDOL) 1.25 MG (50000 UNIT) CAPS capsule Take 1 capsule (50,000 Units total) by mouth every 7 (seven) days. (Patient taking differently: Take 50,000 Units by mouth every Saturday.) 5 capsule 1   zinc sulfate 220 (50 Zn) MG capsule Take 1 capsule (220 mg total) by mouth daily. 90 capsule 0   No current facility-administered medications for this visit.        Physical Exam There were no vitals taken for this visit. Gen:  WD/WN, NAD Skin: incision C/D/I     Assessment/Plan:  No problem-specific Assessment & Plan notes found for this  encounter.      Anthony Hess 10/11/2021, 4:25 PM   This note was created with Dragon medical transcription system.  Any errors from dictation are unintentional.     Patient ID: Anthony Hess, male   DOB: 10/12/1955, 66 y.o.   MRN: [id number]  No chief complaint on file.   HPI Anthony Hess is a 66 y.o. male.  ***   Past Medical History:  Diagnosis Date   Aortic atherosclerosis ([place])    Bilateral carotid artery disease (HCC)    Bladder cancer ([place])    Coronary artery disease 12/20/2018   a.) LHC 12/20/2018: 50% OM1, 40% OM2, 95% o-pLAD, 75% o=pLCx, 40% mLM, 70% D1, 60% mRCA-1, 50% mRCA-2; refer to CVTS. b.) 4v CABG at [facility] on 12/27/2018: LIMA-LAD, RIMA-PDA, seg LRA-OM1-D1   DCM (dilated cardiomyopathy) ([place]) 12/05/2018   a.) TTE 12/05/2018: EF 40-45%. b.) TTE 12/28/2019: EF 20-25%.   ESRD (end stage renal disease) ([place])    a.) T-Th-Sat   HFrEF  (heart failure with reduced ejection fraction) ([place]) 12/05/2018   a.) TTE 12/05/2018: EF 40-45%; mild LVH; ant/apical/sep HK; mild TR . b.) TTE 12/28/2019: EF 20-25%; mod LVH; mod MR/AR; G1DD.   History of 2019 novel coronavirus disease (COVID-19) 04/08/2021   History of kidney stones    HLD (hyperlipidemia)    Hypertension    Infrarenal abdominal aortic aneurysm (AAA) without rupture ([place]) 03/05/2021   a.) CT abd/pelvis; measured 3.2 cm.   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   a.) LIMA-LAD, RIMA-PDA, sequential LEFT radial artery to OM1 and D1   Sepsis ([place]) 03/14/2021   Wears glasses     Past Surgical History:  Procedure Laterality Date   AV FISTULA PLACEMENT Left 07/30/2021   Procedure: INSERTION OF ARTERIOVENOUS (AV) GORE-TEX GRAFT ARM BRACHIAL ARTERY TO AXILLARY VEIN;  Surgeon: Algernon Huxley, MD;  Location: [facility];  Service: Vascular;  Laterality: Left;   CAPD INSERTION N/A 12/31/2019   Procedure: LAPAROSCOPIC INSERTION CONTINUOUS AMBULATORY PERITONEAL DIALYSIS  (CAPD) CATHETER;  Surgeon: Jules Husbands, MD;  Location: [facility];  Service: General;  Laterality: N/A;   CAPD REMOVAL N/A 04/10/2020   Procedure: LAPAROSCOPIC REVISION OF CONTINUOUS AMBULATORY PERITONEAL DIALYSIS  (CAPD) CATHETER;  Surgeon: Jules Husbands, MD;  Location: [facility];  Service: General;  Laterality: N/A;   CORONARY ARTERY BYPASS GRAFT N/A 12/27/2018   Procedure: CORONARY ARTERY BYPASS GRAFTING (CABG) X 4 ON PUMP USING RIGHT & LEFT INTERNAL MAMMARY ARTERY LEFT RADIAL ARTERY ENDOSCOPICALLY HARVESTED;  Surgeon: [name], MD;  Location: [place];  Service: Open Heart Surgery;  Laterality: N/A;   CYSTOSCOPY W/ RETROGRADES Bilateral 05/15/2019   Procedure: CYSTOSCOPY WITH RETROGRADE PYELOGRAM;  Surgeon: Abbie Sons, MD;  Location: [facility];  Service: Urology;  Laterality: Bilateral;   CYSTOSCOPY WITH BIOPSY N/A 05/15/2019   Procedure: CYSTOSCOPY WITH  bladder BIOPSY;  Surgeon: Abbie Sons, MD;  Location: [facility];  Service: Urology;  Laterality: N/A;   DIALYSIS/PERMA CATHETER INSERTION N/A 12/28/2019   Procedure: DIALYSIS/PERMA CATHETER INSERTION;  Surgeon: Algernon Huxley, MD;  Location: [place] [facility] LAB;  Service: Cardiovascular;  Laterality: N/A;   DIALYSIS/PERMA CATHETER INSERTION N/A 03/18/2021   Procedure: DIALYSIS/PERMA CATHETER INSERTION;  Surgeon: Algernon Huxley, MD;  Location: [place] [facility];  Service: Cardiovascular;  Laterality: N/A;   DIALYSIS/PERMA CATHETER REMOVAL N/A 06/02/2020   Procedure: DIALYSIS/PERMA CATHETER REMOVAL;  Surgeon: Algernon Huxley,  MD;  Location: [place] [facility];  Service: Cardiovascular;  Laterality: N/A;   EXCHANGE OF A DIALYSIS CATHETER Right 04/10/2020   Procedure: EXCHANGE OF A DIALYSIS CATHETER;  Surgeon: Jules Husbands, MD;  Location: [facility];  Service: General;  Laterality: Right;   [place]  01/20/2021   Procedure: HERNIA REPAIR INCISIONAL;  Surgeon: Olean Ree, MD;  Location: [facility];  Service: General;;   IR IMAGE GUIDED DRAINAGE PERCUT CATH  PERITONEAL RETROPERIT  04/07/2020   LAPAROSCOPY N/A 04/16/2021   Procedure: LAPAROSCOPY DIAGNOSTIC;  Surgeon: Benjamine Sprague, DO;  Location: [facility];  Service: General;  Laterality: N/A;   LEFT HEART CATH AND CORONARY ANGIOGRAPHY Left 12/20/2018   Procedure: LEFT HEART CATH AND CORONARY ANGIOGRAPHY;  Surgeon: Isaias Cowman, MD;  Location: [place] [facility];  Service: Cardiovascular;  Laterality: Left;   RADIAL ARTERY HARVEST Left 12/27/2018   Procedure: ENDOSCOPIC RADIAL ARTERY HARVEST;  Surgeon: [name], MD;  Location: [place];  Service: Open Heart Surgery;  Laterality: Left;   REMOVAL OF A DIALYSIS CATHETER Left 03/20/2021   Procedure: REMOVAL OF A PD CATHETER;  Surgeon: Algernon Huxley, MD;  Location: [facility];  Service: Vascular;  Laterality: Left;   REVISION OF ARTERIOVENOUS GORETEX GRAFT Left 09/11/2021   Procedure:  Excisionof infected AV graft;  Surgeon: Katha Cabal, MD;  Location: [facility];  Service: Vascular;  Laterality: Left;   TEE WITHOUT CARDIOVERSION N/A 12/27/2018   Procedure: TRANSESOPHAGEAL ECHOCARDIOGRAM (TEE);  Surgeon: [name], MD;  Location: [place];  Service: Open Heart Surgery;  Laterality: N/A;   TUMOR REMOVAL  2019   Bladder      No Known Allergies  Current Outpatient Medications  Medication Sig Dispense Refill   aspirin EC 81 MG tablet Take 81 mg by mouth daily.     atorvastatin (LIPITOR) 40 MG tablet Take 1 tablet (40 mg total) by mouth daily. (Patient taking differently: Take 40 mg by mouth every morning.) 30 tablet 1   calcitRIOL (ROCALTROL) 0.5 MCG capsule Take 0.5 mcg by mouth daily.     doxycycline (VIBRAMYCIN) 100 MG capsule Take 1 capsule (100 mg total) by mouth 2 (two) times daily. 28 capsule 0   metoprolol succinate (TOPROL-XL) 50 MG 24 hr tablet Take 0.5 tablets (25 mg total) by mouth daily. Take with or immediately following a meal. (Patient taking differently: Take 25 mg by mouth every morning. Take with or immediately following a meal.) 30 tablet 1   multivitamin (RENA-VIT) TABS tablet Take 1 tablet by mouth at bedtime. 30 tablet 1   Nutritional Supplements (FEEDING SUPPLEMENT, NEPRO CARB STEADY,) LIQD Take 237 mLs by mouth 3 (three) times daily between meals.  0   oxyCODONE-acetaminophen (PERCOCET) 5-325 MG tablet Take 1-2 tablets by mouth every 6 (six) hours as needed for severe pain. 45 tablet 0   sacubitril-valsartan (ENTRESTO) 24-26 MG Take 1 tablet by mouth 2 (two) times daily. 60 tablet 1   Vitamin D, Ergocalciferol, (DRISDOL) 1.25 MG (50000 UNIT) CAPS capsule Take 1 capsule (50,000 Units total) by mouth every 7 (seven) days. (Patient taking differently: Take 50,000 Units by mouth every Saturday.) 5 capsule 1   zinc sulfate 220 (50 Zn) MG capsule Take 1 capsule (220 mg total) by mouth daily. 90 capsule 0   No current facility-administered  medications for this visit.        Physical Exam There were no vitals taken for this visit. Gen:  WD/WN, NAD Skin: incision C/D/I  Assessment/Plan:  No problem-specific Assessment & Plan notes found for this encounter.      Anthony Hess 10/11/2021, 4:25 PM   This note was created with Dragon medical transcription system.  Any errors from dictation are unintentional.

## 2021-10-12 ENCOUNTER — Ambulatory Visit (INDEPENDENT_AMBULATORY_CARE_PROVIDER_SITE_OTHER): Payer: Medicare Other | Admitting: Vascular Surgery

## 2021-10-12 DIAGNOSIS — N186 End stage renal disease: Secondary | ICD-10-CM

## 2021-10-15 DIAGNOSIS — I151 Hypertension secondary to other renal disorders: Secondary | ICD-10-CM | POA: Insufficient documentation

## 2021-10-29 ENCOUNTER — Ambulatory Visit (INDEPENDENT_AMBULATORY_CARE_PROVIDER_SITE_OTHER): Payer: Medicare Other | Admitting: Vascular Surgery

## 2021-11-01 NOTE — Progress Notes (Signed)
Patient ID: Anthony Hess, male   DOB: 1955-06-14, 66 y.o.   MRN: [id number]  No chief complaint on file.   HPI Anthony Hess is a 66 y.o. male.    The patient returns to the office for followup status post intervention of the dialysis access.   Procedure 09/11/2021: Removal of infected left brachial axillary AV graft Vein patch angioplasty of the brachial artery.  Following the intervention the excess function was unchanged per the patient.  The patient continues to be experiencing increased bleeding times following decannulation and increased recirculation with diminished efficiency of their dialysis. The patient denies an increase in arm swelling. At the present time the patient denies hand pain.  No recent shortening of the patient's walking distance or new symptoms consistent with claudication.  No history of rest pain symptoms. No new ulcers or wounds of the lower extremities have occurred.  The patient denies amaurosis fugax or recent TIA symptoms. There are no recent neurological changes noted. There is no history of DVT, PE or superficial thrombophlebitis. No recent episodes of angina or shortness of breath documented.    Past Medical History:  Diagnosis Date   Aortic atherosclerosis ([place])    Bilateral carotid artery disease ([place])    Bladder cancer ([place])    Coronary artery disease 12/20/2018   a.) LHC 12/20/2018: 50% OM1, 40% OM2, 95% o-pLAD, 75% o=pLCx, 40% mLM, 70% D1, 60% mRCA-1, 50% mRCA-2; refer to CVTS. b.) 4v CABG at [facility] on 12/27/2018: LIMA-LAD, RIMA-PDA, seg LRA-OM1-D1   DCM (dilated cardiomyopathy) ([place]) 12/05/2018   a.) TTE 12/05/2018: EF 40-45%. b.) TTE 12/28/2019: EF 20-25%.   ESRD (end stage renal disease) ([place])    a.) T-Th-Sat   HFrEF (heart failure with reduced ejection fraction) ([place]) 12/05/2018   a.) TTE 12/05/2018: EF 40-45%; mild LVH; ant/apical/sep HK; mild TR . b.) TTE 12/28/2019: EF 20-25%; mod LVH; mod MR/AR; G1DD.   History of 2019 novel  coronavirus disease (COVID-19) 04/08/2021   History of kidney stones    HLD (hyperlipidemia)    Hypertension    Infrarenal abdominal aortic aneurysm (AAA) without rupture ([place]) 03/05/2021   a.) CT abd/pelvis; measured 3.2 cm.   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   a.) LIMA-LAD, RIMA-PDA, sequential LEFT radial artery to OM1 and D1   Sepsis ([place]) 03/14/2021   Wears glasses     Past Surgical History:  Procedure Laterality Date   AV FISTULA PLACEMENT Left 07/30/2021   Procedure: INSERTION OF ARTERIOVENOUS (AV) GORE-TEX GRAFT ARM BRACHIAL ARTERY TO AXILLARY VEIN;  Surgeon: Algernon Huxley, MD;  [facility]: [facility];  Service: Vascular;  Laterality: Left;   CAPD INSERTION N/A 12/31/2019   Procedure: LAPAROSCOPIC INSERTION CONTINUOUS AMBULATORY PERITONEAL DIALYSIS  (CAPD) CATHETER;  Surgeon: Jules Husbands, MD;  [facility]: [facility];  Service: General;  Laterality: N/A;   CAPD REMOVAL N/A 04/10/2020   Procedure: LAPAROSCOPIC REVISION OF CONTINUOUS AMBULATORY PERITONEAL DIALYSIS  (CAPD) CATHETER;  Surgeon: Jules Husbands, MD;  [facility]: [facility];  Service: General;  Laterality: N/A;   CORONARY ARTERY BYPASS GRAFT N/A 12/27/2018   Procedure: CORONARY ARTERY BYPASS GRAFTING (CABG) X 4 ON PUMP USING RIGHT & LEFT INTERNAL MAMMARY ARTERY LEFT RADIAL ARTERY ENDOSCOPICALLY HARVESTED;  Surgeon: [name], MD;  Location: [place];  Service: Open Heart Surgery;  Laterality: N/A;   CYSTOSCOPY W/ RETROGRADES Bilateral 05/15/2019   Procedure: CYSTOSCOPY WITH RETROGRADE PYELOGRAM;  Surgeon:  Stoioff, Ronda Fairly, MD;  Location: [facility];  Service: Urology;  Laterality: Bilateral;   CYSTOSCOPY WITH BIOPSY N/A 05/15/2019   Procedure: CYSTOSCOPY WITH bladder BIOPSY;  Surgeon: Abbie Sons, MD;  [facility]: [facility];  Service: Urology;  Laterality: N/A;   DIALYSIS/PERMA CATHETER INSERTION N/A 12/28/2019   Procedure: DIALYSIS/PERMA CATHETER INSERTION;  Surgeon:  Algernon Huxley, MD;  Location: [place] [facility];  Service: Cardiovascular;  Laterality: N/A;   DIALYSIS/PERMA CATHETER INSERTION N/A 03/18/2021   Procedure: DIALYSIS/PERMA CATHETER INSERTION;  Surgeon: Algernon Huxley, MD;  Location: [place] [facility];  Service: Cardiovascular;  Laterality: N/A;   DIALYSIS/PERMA CATHETER REMOVAL N/A 06/02/2020   Procedure: DIALYSIS/PERMA CATHETER REMOVAL;  Surgeon: Algernon Huxley, MD;  Location: [place] [facility];  Service: Cardiovascular;  Laterality: N/A;   EXCHANGE OF A DIALYSIS CATHETER Right 04/10/2020   Procedure: EXCHANGE OF A DIALYSIS CATHETER;  Surgeon: Jules Husbands, MD;  [facility];  Service: General;  Laterality: Right;   [place]  01/20/2021   Procedure: HERNIA REPAIR INCISIONAL;  Surgeon: Olean Ree, MD;  [facility]: [facility];  Service: General;;   IR IMAGE GUIDED DRAINAGE PERCUT CATH  PERITONEAL RETROPERIT  04/07/2020   LAPAROSCOPY N/A 04/16/2021   Procedure: LAPAROSCOPY DIAGNOSTIC;  Surgeon: Benjamine Sprague, DO;  [facility]: [facility];  Service: General;  Laterality: N/A;   LEFT HEART CATH AND CORONARY ANGIOGRAPHY Left 12/20/2018   Procedure: LEFT HEART CATH AND CORONARY ANGIOGRAPHY;  Surgeon: Isaias Cowman, MD;  Location: [place] [facility];  Service: Cardiovascular;  Laterality: Left;   RADIAL ARTERY HARVEST Left 12/27/2018   Procedure: ENDOSCOPIC RADIAL ARTERY HARVEST;  Surgeon: [name], MD;  Location: [place];  Service: Open Heart Surgery;  Laterality: Left;   REMOVAL OF A DIALYSIS CATHETER Left 03/20/2021   Procedure: REMOVAL OF A PD CATHETER;  Surgeon: Algernon Huxley, MD;  [facility]: [facility];  Service: Vascular;  Laterality: Left;   REVISION OF ARTERIOVENOUS GORETEX GRAFT Left 09/11/2021   Procedure: Excisionof infected AV graft;  Surgeon: Katha Cabal, MD;  [facility];  Service: Vascular;  Laterality: Left;   TEE WITHOUT CARDIOVERSION N/A 12/27/2018   Procedure: TRANSESOPHAGEAL ECHOCARDIOGRAM  (TEE);  Surgeon: [name], MD;  Location: [place];  Service: Open Heart Surgery;  Laterality: N/A;   TUMOR REMOVAL  2019   Bladder      No Known Allergies  Current Outpatient Medications  Medication Sig Dispense Refill   aspirin EC 81 MG tablet Take 81 mg by mouth daily.     atorvastatin (LIPITOR) 40 MG tablet Take 1 tablet (40 mg total) by mouth daily. (Patient taking differently: Take 40 mg by mouth every morning.) 30 tablet 1   calcitRIOL (ROCALTROL) 0.5 MCG capsule Take 0.5 mcg by mouth daily.     doxycycline (VIBRAMYCIN) 100 MG capsule Take 1 capsule (100 mg total) by mouth 2 (two) times daily. 28 capsule 0   metoprolol succinate (TOPROL-XL) 50 MG 24 hr tablet Take 0.5 tablets (25 mg total) by mouth daily. Take with or immediately following a meal. (Patient taking differently: Take 25 mg by mouth every morning. Take with or immediately following a meal.) 30 tablet 1   multivitamin (RENA-VIT) TABS tablet Take 1 tablet by mouth at bedtime. 30 tablet 1   Nutritional Supplements (FEEDING SUPPLEMENT, NEPRO CARB STEADY,) LIQD Take 237 mLs by mouth 3 (three) times daily between meals.  0   oxyCODONE-acetaminophen (PERCOCET) 5-325 MG tablet Take 1-2  tablets by mouth every 6 (six) hours as needed for severe pain. 45 tablet 0   sacubitril-valsartan (ENTRESTO) 24-26 MG Take 1 tablet by mouth 2 (two) times daily. 60 tablet 1   Vitamin D, Ergocalciferol, (DRISDOL) 1.25 MG (50000 UNIT) CAPS capsule Take 1 capsule (50,000 Units total) by mouth every 7 (seven) days. (Patient taking differently: Take 50,000 Units by mouth every Saturday.) 5 capsule 1   zinc sulfate 220 (50 Zn) MG capsule Take 1 capsule (220 mg total) by mouth daily. 90 capsule 0   No current facility-administered medications for this visit.        Physical Exam There were no vitals taken for this visit. Gen:  WD/WN, NAD Skin: incision C/D/I     Assessment/Plan: 1. ESRD (end stage renal disease)  ([place]) Recommend:  The patient is experiencing increasing problems with their dialysis access.  Patient should have a left arm venogram to assess for possible left arm access.  As well as improve the quality of dialysis therapy.  The risks, benefits and alternative therapies were reviewed in detail with the patient.  All questions were answered.  The patient agrees to proceed with angio/intervention.    The patient will follow up with me in the office after the procedure.    2. Primary hypertension Continue antihypertensive medications as already ordered, these medications have been reviewed and there are no changes at this time.   3. Atrial fibrillation, unspecified type ([place]) Continue antiarrhythmia medications as already ordered, these medications have been reviewed and there are no changes at this time.  Continue anticoagulation as ordered by Cardiology Service   4. Pulmonary emphysema, unspecified emphysema type ([place]) Continue pulmonary medications and aerosols as already ordered, these medications have been reviewed and there are no changes at this time.    5. Mixed hyperlipidemia Continue statin as ordered and reviewed, no changes at this time       Hortencia Pilar 11/01/2021, 8:53 PM   This note was created with Dragon medical transcription system.  Any errors from dictation are unintentional.

## 2021-11-02 ENCOUNTER — Encounter (INDEPENDENT_AMBULATORY_CARE_PROVIDER_SITE_OTHER): Payer: Self-pay | Admitting: Vascular Surgery

## 2021-11-02 ENCOUNTER — Ambulatory Visit (INDEPENDENT_AMBULATORY_CARE_PROVIDER_SITE_OTHER): Payer: Medicare Other | Admitting: Vascular Surgery

## 2021-11-02 VITALS — BP 167/95 | HR 78 | Resp 16 | Ht 63.0 in | Wt 128.0 lb

## 2021-11-02 DIAGNOSIS — J439 Emphysema, unspecified: Secondary | ICD-10-CM

## 2021-11-02 DIAGNOSIS — N186 End stage renal disease: Secondary | ICD-10-CM

## 2021-11-02 DIAGNOSIS — I4891 Unspecified atrial fibrillation: Secondary | ICD-10-CM

## 2021-11-02 DIAGNOSIS — I10 Essential (primary) hypertension: Secondary | ICD-10-CM

## 2021-11-02 DIAGNOSIS — E782 Mixed hyperlipidemia: Secondary | ICD-10-CM

## 2021-11-04 ENCOUNTER — Telehealth (INDEPENDENT_AMBULATORY_CARE_PROVIDER_SITE_OTHER): Payer: Self-pay

## 2021-11-04 NOTE — Telephone Encounter (Signed)
Patient returned my call stating that he had started a new job and can not do his procedure at this time. Patient stated he would call when he was ready to schedule.

## 2021-11-04 NOTE — Telephone Encounter (Signed)
I attempted to contact the patient to schedule him for a right arm venogram with Dr. Delana Meyer. The voice mailbox is full for both numbers that are available to contact. I was unable to leave a message.

## 2021-11-08 ENCOUNTER — Encounter (INDEPENDENT_AMBULATORY_CARE_PROVIDER_SITE_OTHER): Payer: Self-pay | Admitting: Vascular Surgery

## 2021-11-16 ENCOUNTER — Other Ambulatory Visit: Payer: Self-pay

## 2021-11-16 ENCOUNTER — Inpatient Hospital Stay
Admission: EM | Admit: 2021-11-16 | Discharge: 2021-11-18 | DRG: 304 | Disposition: A | Discharge status: Home / Self Care | Payer: Medicare Other | Attending: Osteopathic Medicine | Admitting: Osteopathic Medicine

## 2021-11-16 ENCOUNTER — Emergency Department: Payer: Medicare Other

## 2021-11-16 DIAGNOSIS — D631 Anemia in chronic kidney disease: Secondary | ICD-10-CM | POA: Diagnosis present

## 2021-11-16 DIAGNOSIS — Z951 Presence of aortocoronary bypass graft: Secondary | ICD-10-CM

## 2021-11-16 DIAGNOSIS — I739 Peripheral vascular disease, unspecified: Secondary | ICD-10-CM | POA: Diagnosis present

## 2021-11-16 DIAGNOSIS — T465X6A Underdosing of other antihypertensive drugs, initial encounter: Secondary | ICD-10-CM | POA: Diagnosis present

## 2021-11-16 DIAGNOSIS — I700 Atherosclerosis of aorta: Secondary | ICD-10-CM | POA: Diagnosis present

## 2021-11-16 DIAGNOSIS — Z8551 Personal history of malignant neoplasm of bladder: Secondary | ICD-10-CM

## 2021-11-16 DIAGNOSIS — N2581 Secondary hyperparathyroidism of renal origin: Secondary | ICD-10-CM | POA: Diagnosis present

## 2021-11-16 DIAGNOSIS — I2510 Atherosclerotic heart disease of native coronary artery without angina pectoris: Secondary | ICD-10-CM | POA: Diagnosis present

## 2021-11-16 DIAGNOSIS — Z5900 Homelessness unspecified: Secondary | ICD-10-CM

## 2021-11-16 DIAGNOSIS — I160 Hypertensive urgency: Principal | ICD-10-CM | POA: Diagnosis present

## 2021-11-16 DIAGNOSIS — I252 Old myocardial infarction: Secondary | ICD-10-CM

## 2021-11-16 DIAGNOSIS — Z20822 Contact with and (suspected) exposure to covid-19: Secondary | ICD-10-CM | POA: Diagnosis present

## 2021-11-16 DIAGNOSIS — Z992 Dependence on renal dialysis: Secondary | ICD-10-CM

## 2021-11-16 DIAGNOSIS — N189 Chronic kidney disease, unspecified: Secondary | ICD-10-CM | POA: Diagnosis present

## 2021-11-16 DIAGNOSIS — I420 Dilated cardiomyopathy: Secondary | ICD-10-CM | POA: Diagnosis present

## 2021-11-16 DIAGNOSIS — N186 End stage renal disease: Secondary | ICD-10-CM | POA: Diagnosis present

## 2021-11-16 DIAGNOSIS — G8929 Other chronic pain: Secondary | ICD-10-CM | POA: Diagnosis present

## 2021-11-16 DIAGNOSIS — E785 Hyperlipidemia, unspecified: Secondary | ICD-10-CM | POA: Diagnosis present

## 2021-11-16 DIAGNOSIS — Z87891 Personal history of nicotine dependence: Secondary | ICD-10-CM

## 2021-11-16 DIAGNOSIS — E782 Mixed hyperlipidemia: Secondary | ICD-10-CM | POA: Diagnosis present

## 2021-11-16 DIAGNOSIS — Z87442 Personal history of urinary calculi: Secondary | ICD-10-CM

## 2021-11-16 DIAGNOSIS — I5022 Chronic systolic (congestive) heart failure: Secondary | ICD-10-CM | POA: Diagnosis present

## 2021-11-16 DIAGNOSIS — I10 Essential (primary) hypertension: Secondary | ICD-10-CM | POA: Diagnosis present

## 2021-11-16 DIAGNOSIS — I714 Abdominal aortic aneurysm, without rupture, unspecified: Secondary | ICD-10-CM | POA: Diagnosis present

## 2021-11-16 DIAGNOSIS — Z7982 Long term (current) use of aspirin: Secondary | ICD-10-CM

## 2021-11-16 DIAGNOSIS — Z8616 Personal history of COVID-19: Secondary | ICD-10-CM

## 2021-11-16 DIAGNOSIS — I132 Hypertensive heart and chronic kidney disease with heart failure and with stage 5 chronic kidney disease, or end stage renal disease: Secondary | ICD-10-CM | POA: Diagnosis present

## 2021-11-16 DIAGNOSIS — Z79899 Other long term (current) drug therapy: Secondary | ICD-10-CM

## 2021-11-16 DIAGNOSIS — E875 Hyperkalemia: Secondary | ICD-10-CM | POA: Diagnosis present

## 2021-11-16 LAB — URINALYSIS, ROUTINE W REFLEX MICROSCOPIC
Bacteria, UA: NONE SEEN
Bilirubin Urine: NEGATIVE
Glucose, UA: 150 mg/dL — AB
Hgb urine dipstick: NEGATIVE
Ketones, ur: NEGATIVE mg/dL
Leukocytes,Ua: NEGATIVE
Nitrite: NEGATIVE
Protein, ur: 100 mg/dL — AB
Specific Gravity, Urine: 1.009 (ref 1.005–1.030)
pH: 7 (ref 5.0–8.0)

## 2021-11-16 LAB — COMPREHENSIVE METABOLIC PANEL
ALT: 37 U/L (ref 0–44)
AST: 26 U/L (ref 15–41)
Albumin: 4.2 g/dL (ref 3.5–5.0)
Alkaline Phosphatase: 60 U/L (ref 38–126)
Anion gap: 16 — ABNORMAL HIGH (ref 5–15)
BUN: 86 mg/dL — ABNORMAL HIGH (ref 8–23)
CO2: 20 mmol/L — ABNORMAL LOW (ref 22–32)
Calcium: 7.8 mg/dL — ABNORMAL LOW (ref 8.9–10.3)
Chloride: 107 mmol/L (ref 98–111)
Creatinine, Ser: 8.21 mg/dL — ABNORMAL HIGH (ref 0.61–1.24)
GFR, Estimated: 7 mL/min — ABNORMAL LOW (ref >60)
Glucose, Bld: 97 mg/dL (ref 70–99)
Potassium: 5.8 mmol/L — ABNORMAL HIGH (ref 3.5–5.1)
Sodium: 143 mmol/L (ref 135–145)
Total Bilirubin: 0.7 mg/dL (ref 0.3–1.2)
Total Protein: 7.9 g/dL (ref 6.5–8.1)

## 2021-11-16 LAB — PROTIME-INR
INR: 1 (ref 0.8–1.2)
Prothrombin Time: 13 seconds (ref 11.4–15.2)

## 2021-11-16 LAB — CBC WITH DIFFERENTIAL/PLATELET
Abs Immature Granulocytes: 0.04 10*3/uL (ref 0.00–0.07)
Basophils Absolute: 0.1 10*3/uL (ref 0.0–0.1)
Basophils Relative: 1 %
Eosinophils Absolute: 0.6 10*3/uL — ABNORMAL HIGH (ref 0.0–0.5)
Eosinophils Relative: 6 %
HCT: 39.9 % (ref 39.0–52.0)
Hemoglobin: 12 g/dL — ABNORMAL LOW (ref 13.0–17.0)
Immature Granulocytes: 0 %
Lymphocytes Relative: 11 %
Lymphs Abs: 1.2 10*3/uL (ref 0.7–4.0)
MCH: 28.3 pg (ref 26.0–34.0)
MCHC: 30.1 g/dL (ref 30.0–36.0)
MCV: 94.1 fL (ref 80.0–100.0)
Monocytes Absolute: 0.8 10*3/uL (ref 0.1–1.0)
Monocytes Relative: 8 %
Neutro Abs: 7.8 10*3/uL — ABNORMAL HIGH (ref 1.7–7.7)
Neutrophils Relative %: 74 %
Platelets: 308 10*3/uL (ref 150–400)
RBC: 4.24 MIL/uL (ref 4.22–5.81)
RDW: 16.3 % — ABNORMAL HIGH (ref 11.5–15.5)
WBC: 10.5 10*3/uL (ref 4.0–10.5)
nRBC: 0 % (ref 0.0–0.2)

## 2021-11-16 LAB — HEPATITIS B SURFACE ANTIBODY,QUALITATIVE: Hep B S Ab: REACTIVE — AB

## 2021-11-16 LAB — TROPONIN I (HIGH SENSITIVITY): Troponin I (High Sensitivity): 19 ng/L — ABNORMAL HIGH (ref <18)

## 2021-11-16 LAB — LACTIC ACID, PLASMA: Lactic Acid, Venous: 1.1 mmol/L (ref 0.5–1.9)

## 2021-11-16 LAB — SARS CORONAVIRUS 2 BY RT PCR: SARS Coronavirus 2 by RT PCR: NEGATIVE

## 2021-11-16 LAB — MAGNESIUM: Magnesium: 2.7 mg/dL — ABNORMAL HIGH (ref 1.7–2.4)

## 2021-11-16 LAB — HEPATITIS B CORE ANTIBODY, TOTAL: Hep B Core Total Ab: NONREACTIVE

## 2021-11-16 LAB — HEPATITIS B SURFACE ANTIGEN: Hepatitis B Surface Ag: NONREACTIVE

## 2021-11-16 LAB — HEPATITIS C ANTIBODY: HCV Ab: NONREACTIVE

## 2021-11-16 MED ORDER — LIDOCAINE HCL (PF) 1 % IJ SOLN: 5.0000 mL | INTRAMUSCULAR | Status: DC | PRN

## 2021-11-16 MED ORDER — LIDOCAINE-PRILOCAINE 2.5-2.5 % EX CREA: 1.0000 | TOPICAL_CREAM | CUTANEOUS | Status: DC | PRN

## 2021-11-16 MED ORDER — OXYCODONE-ACETAMINOPHEN 5-325 MG PO TABS
1.0000 | ORAL_TABLET | Freq: Four times a day (QID) | ORAL | Status: DC | PRN
  Administered 2021-11-16: 1 via ORAL
  Filled 2021-11-16: qty 1

## 2021-11-16 MED ORDER — AMLODIPINE BESYLATE 10 MG PO TABS
10.0000 mg | ORAL_TABLET | Freq: Every day | ORAL | Status: DC
  Administered 2021-11-16 – 2021-11-18 (×3): 10 mg via ORAL
  Filled 2021-11-16 (×3): qty 1

## 2021-11-16 MED ORDER — HEPARIN SODIUM (PORCINE) 1000 UNIT/ML DIALYSIS
1000.0000 [IU] | INTRAMUSCULAR | Status: DC | PRN
  Filled 2021-11-16: qty 1

## 2021-11-16 MED ORDER — CALCITRIOL 0.25 MCG PO CAPS
0.5000 ug | ORAL_CAPSULE | Freq: Every day | ORAL | Status: DC
  Administered 2021-11-17 – 2021-11-18 (×2): 0.5 ug via ORAL
  Filled 2021-11-16 (×2): qty 2

## 2021-11-16 MED ORDER — ONDANSETRON HCL 4 MG PO TABS: 4.0000 mg | ORAL_TABLET | Freq: Four times a day (QID) | ORAL | Status: DC | PRN

## 2021-11-16 MED ORDER — OXYCODONE-ACETAMINOPHEN 5-325 MG PO TABS
1.0000 | ORAL_TABLET | Freq: Four times a day (QID) | ORAL | Status: DC | PRN
  Administered 2021-11-17: 2 via ORAL
  Administered 2021-11-17: 1 via ORAL
  Administered 2021-11-17 – 2021-11-18 (×3): 2 via ORAL
  Filled 2021-11-16 (×2): qty 2
  Filled 2021-11-16: qty 1
  Filled 2021-11-16 (×2): qty 2

## 2021-11-16 MED ORDER — ALUM & MAG HYDROXIDE-SIMETH 200-200-20 MG/5ML PO SUSP
30.0000 mL | Freq: Once | ORAL | Status: AC
  Administered 2021-11-16: 30 mL via ORAL
  Filled 2021-11-16: qty 30

## 2021-11-16 MED ORDER — ALTEPLASE 2 MG IJ SOLR: 2.0000 mg | Freq: Once | INTRAMUSCULAR | Status: DC | PRN

## 2021-11-16 MED ORDER — ZINC SULFATE 220 (50 ZN) MG PO CAPS
220.0000 mg | ORAL_CAPSULE | Freq: Every day | ORAL | Status: DC
  Administered 2021-11-17 – 2021-11-18 (×2): 220 mg via ORAL
  Filled 2021-11-16 (×2): qty 1

## 2021-11-16 MED ORDER — AMLODIPINE BESYLATE 10 MG PO TABS: 10.0000 mg | ORAL_TABLET | Freq: Every day | ORAL | Status: DC

## 2021-11-16 MED ORDER — RENA-VITE PO TABS
1.0000 | ORAL_TABLET | Freq: Every day | ORAL | Status: DC
  Administered 2021-11-17: 1 via ORAL
  Filled 2021-11-16 (×2): qty 1

## 2021-11-16 MED ORDER — ONDANSETRON HCL 4 MG/2ML IJ SOLN: 4.0000 mg | Freq: Four times a day (QID) | INTRAMUSCULAR | Status: DC | PRN

## 2021-11-16 MED ORDER — SACUBITRIL-VALSARTAN 24-26 MG PO TABS
1.0000 | ORAL_TABLET | Freq: Two times a day (BID) | ORAL | Status: DC
  Filled 2021-11-16: qty 1

## 2021-11-16 MED ORDER — OXYCODONE-ACETAMINOPHEN 5-325 MG PO TABS: 1.0000 | ORAL_TABLET | Freq: Four times a day (QID) | ORAL | Status: DC | PRN

## 2021-11-16 MED ORDER — NEPRO/CARBSTEADY PO LIQD
237.0000 mL | Freq: Three times a day (TID) | ORAL | Status: DC
  Administered 2021-11-17 – 2021-11-18 (×4): 237 mL via ORAL

## 2021-11-16 MED ORDER — HEPARIN SODIUM (PORCINE) 5000 UNIT/ML IJ SOLN
5000.0000 [IU] | Freq: Three times a day (TID) | INTRAMUSCULAR | Status: DC
  Administered 2021-11-16 – 2021-11-18 (×6): 5000 [IU] via SUBCUTANEOUS
  Filled 2021-11-16 (×6): qty 1

## 2021-11-16 MED ORDER — ONDANSETRON HCL 4 MG/2ML IJ SOLN
4.0000 mg | Freq: Once | INTRAMUSCULAR | Status: AC
  Administered 2021-11-16: 4 mg via INTRAVENOUS
  Filled 2021-11-16: qty 2

## 2021-11-16 MED ORDER — ANTICOAGULANT SODIUM CITRATE 4% (200MG/5ML) IV SOLN: 5.0000 mL | Status: DC | PRN

## 2021-11-16 MED ORDER — CALCIUM ACETATE (PHOS BINDER) 667 MG PO CAPS
667.0000 mg | ORAL_CAPSULE | Freq: Three times a day (TID) | ORAL | Status: DC
  Administered 2021-11-17 – 2021-11-18 (×5): 667 mg via ORAL
  Filled 2021-11-16 (×5): qty 1

## 2021-11-16 MED ORDER — PANTOPRAZOLE SODIUM 40 MG IV SOLR
40.0000 mg | Freq: Once | INTRAVENOUS | Status: AC
  Administered 2021-11-16: 40 mg via INTRAVENOUS
  Filled 2021-11-16: qty 10

## 2021-11-16 MED ORDER — SENNOSIDES-DOCUSATE SODIUM 8.6-50 MG PO TABS: 1.0000 | ORAL_TABLET | Freq: Every evening | ORAL | Status: DC | PRN

## 2021-11-16 MED ORDER — ACETAMINOPHEN 500 MG PO TABS: 1000.0000 mg | ORAL_TABLET | Freq: Four times a day (QID) | ORAL | Status: DC | PRN

## 2021-11-16 MED ORDER — PENTAFLUOROPROP-TETRAFLUOROETH EX AERO: 1.0000 | INHALATION_SPRAY | CUTANEOUS | Status: DC | PRN

## 2021-11-16 MED ORDER — FENTANYL CITRATE PF 50 MCG/ML IJ SOSY
50.0000 ug | PREFILLED_SYRINGE | Freq: Once | INTRAMUSCULAR | Status: AC
  Administered 2021-11-16: 50 ug via INTRAVENOUS
  Filled 2021-11-16: qty 1

## 2021-11-16 MED ORDER — MORPHINE SULFATE (PF) 4 MG/ML IV SOLN: 4.0000 mg | INTRAVENOUS | Status: DC | PRN

## 2021-11-16 MED ORDER — IOHEXOL 350 MG/ML SOLN
75.0000 mL | Freq: Once | INTRAVENOUS | Status: AC | PRN
  Administered 2021-11-16: 75 mL via INTRAVENOUS

## 2021-11-16 MED ORDER — CHLORHEXIDINE GLUCONATE CLOTH 2 % EX PADS
6.0000 | MEDICATED_PAD | Freq: Every day | CUTANEOUS | Status: DC
  Administered 2021-11-17 – 2021-11-18 (×2): 6 via TOPICAL

## 2021-11-16 MED ORDER — ACETAMINOPHEN 650 MG RE SUPP: 650.0000 mg | Freq: Four times a day (QID) | RECTAL | Status: DC | PRN

## 2021-11-16 MED ORDER — ATORVASTATIN CALCIUM 20 MG PO TABS
40.0000 mg | ORAL_TABLET | ORAL | Status: DC
  Administered 2021-11-17 – 2021-11-18 (×2): 40 mg via ORAL
  Filled 2021-11-16 (×2): qty 2

## 2021-11-16 MED ORDER — HYDRALAZINE HCL 20 MG/ML IJ SOLN
10.0000 mg | Freq: Three times a day (TID) | INTRAMUSCULAR | Status: AC | PRN
  Administered 2021-11-18: 10 mg via INTRAVENOUS
  Filled 2021-11-16: qty 1

## 2021-11-16 MED ORDER — SUCROFERRIC OXYHYDROXIDE 500 MG PO CHEW
500.0000 mg | CHEWABLE_TABLET | Freq: Three times a day (TID) | ORAL | Status: DC
  Administered 2021-11-17 – 2021-11-18 (×5): 500 mg via ORAL
  Filled 2021-11-16 (×6): qty 1

## 2021-11-16 MED ORDER — ASPIRIN 81 MG PO TBEC
81.0000 mg | DELAYED_RELEASE_TABLET | Freq: Every day | ORAL | Status: DC
  Administered 2021-11-16 – 2021-11-18 (×3): 81 mg via ORAL
  Filled 2021-11-16 (×3): qty 1

## 2021-11-16 NOTE — Assessment & Plan Note (Signed)
-   Amlodipine 10 mg daily resumed - Hydralazine 10 mg IV every 6 hours as needed for SBP greater than 180, 2 days

## 2021-11-16 NOTE — Assessment & Plan Note (Signed)
-   Pain is unchanged from baseline - Home medications: Oxycodone-acetaminophen 5-325 mg p.o., 1 to 2 tablets every 6 hours as needed for moderate and severe pain resumed

## 2021-11-16 NOTE — Progress Notes (Signed)
Pt 3.5 hour HD treatment complete w/ no complications. Alert, no c/o, stable, sbp >180, report to primary RN. Start: 1440 End: 1813 1088m fluid removed 81.6L BVP 56.2kg post standing weight No meds ordered w/ HD

## 2021-11-16 NOTE — Progress Notes (Signed)
Post HD RN assessment 

## 2021-11-16 NOTE — Assessment & Plan Note (Signed)
-   Due to incomplete dialysis session on Saturday 11/14/2021 due to catheter malfunction - Nephrology has been consulted

## 2021-11-16 NOTE — Progress Notes (Signed)
Pre HD RN assessment 

## 2021-11-16 NOTE — ED Notes (Signed)
Patient refusing In and Out at this time. Md Ellender Hose made aware

## 2021-11-16 NOTE — Assessment & Plan Note (Signed)
-   Nephrology has been consulted

## 2021-11-16 NOTE — ED Triage Notes (Signed)
Pt picked up by EMS walking behind mechanical shop. Pt states he is dialysis pt, only received half of his treatment on Saturday due to catheter not working. Pt also complaining of lower left back pain and generalized abdominal pain.  BP 207/131 HR 74 100% RA

## 2021-11-16 NOTE — H&P (Addendum)
History and Physical   Anthony Hess SJG:[id number] DOB: 07/07/1955 DOA: 11/16/2021  PCP: Patient, No Pcp Per  Outpatient Specialists: Dr. Holley Raring, nephrology Patient coming from: Home via EMS  I have personally briefly reviewed patient's old medical records in [facility].  Chief Concern: Left flank pain.  HPI: [name] is a 66 year old male with end-stage renal disease on hemodialysis, hypertension, hyperlipidemia, who presents emergency department for chief concerns of left flank pain.  Initial vitals in the emergency department showed temperature 98.9, respiration rate of 18, heart rate 67, blood pressure 204/119, SPO2 of 100% on room air.  Serum sodium is 143, potassium 4.8, chloride 107, bicarb 20, BUN of 97, serum creatinine of 8.21, nonfasting blood glucose 97, WBC 10.5, hemoglobin 12, platelets of 308.  High sensitivity troponin is 19.  Lactic acid is 1.1.  UA was negative for leukocytes and nitrates.  COVID by PCR was negative.  Magnesium was elevated at 2.7.  ED treatment: Fentanyl 50 mcg, ondansetron 4 mg IV, pantoprazole 40 mg IV one-time dose ------------------------- At bedside patient is able to tell me his name, his age, he knows the current calendar year.  He does not appear to be in acute distress.  He is watching TV comfortably.  He reports that he only received half of his dialysis session on Saturday due to a catheter malfunction.  He endorses feeling short of breath along with left sided flank pain.  He denies trauma to his person.  She reports he is recently been homeless as he and his girlfriend have Split up.  Social history: He is currently homeless.  He is a former tobacco user quitting 2 years ago.  He denies EtOH and recreational drug use.  ROS: Constitutional: no weight change, no fever ENT/Mouth: no sore throat, no rhinorrhea Eyes: no eye pain, no vision changes Cardiovascular: no chest pain, no dyspnea,  no edema, no  palpitations Respiratory: no cough, no sputum, no wheezing Gastrointestinal: no nausea, no vomiting, no diarrhea, no constipation Genitourinary: no urinary incontinence, no dysuria, no hematuria Musculoskeletal: no arthralgias, no myalgias Skin: no skin lesions, no pruritus, Neuro: + weakness, no loss of consciousness, no syncope Psych: no anxiety, no depression, + decrease appetite Heme/Lymph: no bruising, no bleeding  ED Course: Discussed with emergency medicine provider, patient requiring hospitalization for chief concerns of dialysis requirement in setting of catheter malfunctioning and hypertensive urgency.  Assessment/Plan  Principal Problem:   Hypertensive urgency Active Problems:   Hypertension   S/P CABG x 4   ESRD (end stage renal disease) (HCC)   Anemia in ESRD (end-stage renal disease) ([place])   Hyperlipidemia   Hyperparathyroidism due to renal insufficiency (HCC)   Presence of aortocoronary bypass graft   AAA (abdominal aortic aneurysm) (HCC)   Hyperkalemia   Chronic back pain   Assessment and Plan:  * Hypertensive urgency - Amlodipine 10 mg daily resumed - Hydralazine 10 mg IV every 6 hours as needed for SBP greater than 180, 2 days   Chronic back pain - Pain is unchanged from baseline - Home medications: Oxycodone-acetaminophen 5-325 mg p.o., 1 to 2 tablets every 6 hours as needed for moderate and severe pain resumed  Hyperkalemia - Due to incomplete dialysis session on Saturday 11/14/2021 due to catheter malfunction - Nephrology has been consulted  Hyperlipidemia - Atorvastatin 40 mg daily resumed  ESRD (end stage renal disease) [facility]) - Nephrology has been consulted  Hypertension - Sacubitril-valsartan 24-26 mg tablet, 1 tablet twice daily resumed in the  emergency department however with med reconciliation, patient's Delene Loll has been discontinued by provider therefore I have not resumed this on admission - Resumed home amlodipine 10 mg daily -  Hydralazine 10 mg IV every 8 hours as needed for SBP greater than 180, 2 days ordered  Chart reviewed.   DVT prophylaxis: Heparin 5000 units subcutaneous every 8 hours Code Status: Renal diet Diet: Renal diet Family Communication: No Disposition Plan: Pending clinical course Consults called: Nephrology Admission status: Progressive, observation  Past Medical History:  Diagnosis Date   Aortic atherosclerosis ([place])    Bilateral carotid artery disease (HCC)    Bladder cancer ([place])    Coronary artery disease 12/20/2018   a.) LHC 12/20/2018: 50% OM1, 40% OM2, 95% o-pLAD, 75% o=pLCx, 40% mLM, 70% D1, 60% mRCA-1, 50% mRCA-2; refer to CVTS. b.) 4v CABG at [facility] on 12/27/2018: LIMA-LAD, RIMA-PDA, seg LRA-OM1-D1   DCM (dilated cardiomyopathy) ([place]) 12/05/2018   a.) TTE 12/05/2018: EF 40-45%. b.) TTE 12/28/2019: EF 20-25%.   ESRD (end stage renal disease) ([place])    a.) T-Th-Sat   HFrEF (heart failure with reduced ejection fraction) ([place]) 12/05/2018   a.) TTE 12/05/2018: EF 40-45%; mild LVH; ant/apical/sep HK; mild TR . b.) TTE 12/28/2019: EF 20-25%; mod LVH; mod MR/AR; G1DD.   History of 2019 novel coronavirus disease (COVID-19) 04/08/2021   History of kidney stones    HLD (hyperlipidemia)    Hypertension    Infrarenal abdominal aortic aneurysm (AAA) without rupture ([place]) 03/05/2021   a.) CT abd/pelvis; measured 3.2 cm.   Myocardial infarction [facility])    PVD (peripheral vascular disease) ([place])    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   a.) LIMA-LAD, RIMA-PDA, sequential LEFT radial artery to OM1 and D1   Sepsis ([place]) 03/14/2021   Wears glasses    Past Surgical History:  Procedure Laterality Date   AV FISTULA PLACEMENT Left 07/30/2021   Procedure: INSERTION OF ARTERIOVENOUS (AV) GORE-TEX GRAFT ARM BRACHIAL ARTERY TO AXILLARY VEIN;  Surgeon: Algernon Huxley, MD;  Location: [facility] ORS;  Service: Vascular;  Laterality: Left;   CAPD INSERTION N/A 12/31/2019   Procedure: LAPAROSCOPIC INSERTION CONTINUOUS AMBULATORY  PERITONEAL DIALYSIS  (CAPD) CATHETER;  Surgeon: Jules Husbands, MD;  Location: [facility];  Service: General;  Laterality: N/A;   CAPD REMOVAL N/A 04/10/2020   Procedure: LAPAROSCOPIC REVISION OF CONTINUOUS AMBULATORY PERITONEAL DIALYSIS  (CAPD) CATHETER;  Surgeon: Jules Husbands, MD;  Location: [facility];  Service: General;  Laterality: N/A;   CORONARY ARTERY BYPASS GRAFT N/A 12/27/2018   Procedure: CORONARY ARTERY BYPASS GRAFTING (CABG) X 4 ON PUMP USING RIGHT & LEFT INTERNAL MAMMARY ARTERY LEFT RADIAL ARTERY ENDOSCOPICALLY HARVESTED;  Surgeon: [name], MD;  Location: [place];  Service: Open Heart Surgery;  Laterality: N/A;   CYSTOSCOPY W/ RETROGRADES Bilateral 05/15/2019   Procedure: CYSTOSCOPY WITH RETROGRADE PYELOGRAM;  Surgeon: Abbie Sons, MD;  Location: [facility];  Service: Urology;  Laterality: Bilateral;   CYSTOSCOPY WITH BIOPSY N/A 05/15/2019   Procedure: CYSTOSCOPY WITH bladder BIOPSY;  Surgeon: Abbie Sons, MD;  Location: [facility];  Service: Urology;  Laterality: N/A;   DIALYSIS/PERMA CATHETER INSERTION N/A 12/28/2019   Procedure: DIALYSIS/PERMA CATHETER INSERTION;  Surgeon: Algernon Huxley, MD;  Location: [place] CV LAB;  Service: Cardiovascular;  Laterality: N/A;   DIALYSIS/PERMA CATHETER INSERTION N/A 03/18/2021   Procedure: DIALYSIS/PERMA CATHETER INSERTION;  Surgeon: Algernon Huxley, MD;  Location: [place] CV LAB;  Service: Cardiovascular;  Laterality: N/A;   DIALYSIS/PERMA CATHETER  REMOVAL N/A 06/02/2020   Procedure: DIALYSIS/PERMA CATHETER REMOVAL;  Surgeon: Algernon Huxley, MD;  Location: [place] CV LAB;  Service: Cardiovascular;  Laterality: N/A;   EXCHANGE OF A DIALYSIS CATHETER Right 04/10/2020   Procedure: EXCHANGE OF A DIALYSIS CATHETER;  Surgeon: Jules Husbands, MD;  Location: [facility];  Service: General;  Laterality: Right;   [place]  01/20/2021   Procedure: HERNIA REPAIR INCISIONAL;  Surgeon: Olean Ree, MD;  Location: [facility];   Service: General;;   IR IMAGE GUIDED DRAINAGE PERCUT CATH  PERITONEAL RETROPERIT  04/07/2020   LAPAROSCOPY N/A 04/16/2021   Procedure: LAPAROSCOPY DIAGNOSTIC;  Surgeon: Benjamine Sprague, DO;  Location: [facility];  Service: General;  Laterality: N/A;   LEFT HEART CATH AND CORONARY ANGIOGRAPHY Left 12/20/2018   Procedure: LEFT HEART CATH AND CORONARY ANGIOGRAPHY;  Surgeon: Isaias Cowman, MD;  Location: [place] CV LAB;  Service: Cardiovascular;  Laterality: Left;   RADIAL ARTERY HARVEST Left 12/27/2018   Procedure: ENDOSCOPIC RADIAL ARTERY HARVEST;  Surgeon: [name], MD;  Location: [place];  Service: Open Heart Surgery;  Laterality: Left;   REMOVAL OF A DIALYSIS CATHETER Left 03/20/2021   Procedure: REMOVAL OF A PD CATHETER;  Surgeon: Algernon Huxley, MD;  Location: [facility];  Service: Vascular;  Laterality: Left;   REVISION OF ARTERIOVENOUS GORETEX GRAFT Left 09/11/2021   Procedure: Excisionof infected AV graft;  Surgeon: Katha Cabal, MD;  Location: [facility];  Service: Vascular;  Laterality: Left;   TEE WITHOUT CARDIOVERSION N/A 12/27/2018   Procedure: TRANSESOPHAGEAL ECHOCARDIOGRAM (TEE);  Surgeon: [name], MD;  Location: [place];  Service: Open Heart Surgery;  Laterality: N/A;   TUMOR REMOVAL  2019   Bladder   Social History:  reports that he quit smoking about 2 years ago. His smoking use included cigarettes. He smoked an average of .25 packs per day. He has never used smokeless tobacco. He reports that he does not drink alcohol and does not use drugs.  No Known Allergies Family History  Family history unknown: Yes   Family history: Family history reviewed and not pertinent.  Prior to Admission medications   Medication Sig Start Date End Date Taking? Authorizing Provider  amLODipine (NORVASC) 10 MG tablet Take 10 mg by mouth daily.   Yes [provider]  aspirin EC 81 MG tablet Take 81 mg by mouth daily.   Yes [name], MD  atorvastatin (LIPITOR)  40 MG tablet Take 1 tablet (40 mg total) by mouth daily. Patient taking differently: Take 40 mg by mouth every morning. 01/01/20  Yes Fritzi Mandes, MD  calcitRIOL (ROCALTROL) 0.5 MCG capsule Take 0.5 mcg by mouth daily.   Yes [provider]  calcium acetate (PHOSLO) 667 MG capsule Take by mouth 3 (three) times daily with meals.   Yes [provider]  multivitamin (RENA-VIT) TABS tablet Take 1 tablet by mouth at bedtime. 03/23/21  Yes Ezekiel Slocumb, DO  sucroferric oxyhydroxide (VELPHORO) 500 MG chewable tablet Chew 500 mg by mouth 3 (three) times daily with meals.   Yes [provider]  zinc sulfate 220 (50 Zn) MG capsule Take 1 capsule (220 mg total) by mouth daily. 04/18/21  Yes Lorella Nimrod, MD  metoprolol succinate (TOPROL-XL) 50 MG 24 hr tablet Take 0.5 tablets (25 mg total) by mouth daily. Take with or immediately following a meal. Patient not taking: Reported on 11/16/2021 04/17/21   Lorella Nimrod, MD  Nutritional Supplements (FEEDING SUPPLEMENT, NEPRO  CARB STEADY,) LIQD Take 237 mLs by mouth 3 (three) times daily between meals. 03/23/21   Ezekiel Slocumb, DO  oxyCODONE-acetaminophen (PERCOCET) 5-325 MG tablet Take 1-2 tablets by mouth every 6 (six) hours as needed for severe pain. Patient not taking: Reported on 11/16/2021 09/11/21 09/11/22  Schnier, Dolores Lory, MD  sacubitril-valsartan (ENTRESTO) 24-26 MG Take 1 tablet by mouth 2 (two) times daily. Patient not taking: Reported on 11/16/2021 04/17/21   Lorella Nimrod, MD  Vitamin D, Ergocalciferol, (DRISDOL) 1.25 MG (50000 UNIT) CAPS capsule Take 1 capsule (50,000 Units total) by mouth every 7 (seven) days. Patient not taking: Reported on 11/16/2021 01/05/20   Fritzi Mandes, MD   Physical Exam: Vitals:   11/16/21 1813 11/16/21 1818 11/16/21 1825 11/16/21 1833  BP: (!) 185/121 (!) 181/112  (!) 167/112  Pulse: 75 74  79  Resp: (!) 21 20  (!) 21  Temp: (!) 97.4 F (36.3 C)   98.6 F (37 C)  TempSrc: Oral   Oral   SpO2: 100% 98%  99%  Weight:   56.2 kg   Height:       Constitutional: appears older than chronological age, frail, NAD, calm, comfortable Eyes: PERRL, lids and conjunctivae normal ENMT: Mucous membranes are moist. Posterior pharynx clear of any exudate or lesions. Age-appropriate dentition. Hearing appropriate Neck: normal, supple, no masses, no thyromegaly Respiratory: clear to auscultation bilaterally, no wheezing, no crackles. Normal respiratory effort. No accessory muscle use.  Cardiovascular: Regular rate and rhythm, no murmurs / rubs / gallops. No extremity edema. 2+ pedal pulses. No carotid bruits.  Abdomen: no tenderness, no masses palpated, no hepatosplenomegaly. Bowel sounds positive.  Musculoskeletal: no clubbing / cyanosis. No joint deformity upper and lower extremities. Good ROM, no contractures, no atrophy. Normal muscle tone.  Skin: no rashes, lesions, ulcers. No induration Neurologic: Sensation intact. Strength 5/5 in all 4.  Psychiatric: Normal judgment and insight. Alert and oriented x 3. Normal mood.   EKG: independently reviewed, showing ectopic atrial rhythm, rate of 69, QTc 492  Chest x-ray on Admission: Not indicated on admission  CT Angio Chest/Abd/Pel for Dissection W and/or Wo Contrast  Result Date: 11/16/2021 CLINICAL DATA:  Acute aortic syndrome suspected. EXAM: CT ANGIOGRAPHY CHEST, ABDOMEN AND PELVIS TECHNIQUE: Non-contrast CT of the chest was initially obtained. Multidetector CT imaging through the chest, abdomen and pelvis was performed using the standard protocol during bolus administration of intravenous contrast. Multiplanar reconstructed images and MIPs were obtained and reviewed to evaluate the vascular anatomy. RADIATION DOSE REDUCTION: This exam was performed according to the departmental dose-optimization program which includes automated exposure control, adjustment of the mA and/or kV according to patient size and/or use of iterative reconstruction  technique. CONTRAST:  75m OMNIPAQUE IOHEXOL 350 MG/ML SOLN COMPARISON:  None Available. FINDINGS: CTA CHEST FINDINGS Cardiovascular: Preferential opacification of the thoracic aorta. No evidence of thoracic aortic aneurysm or dissection. Normal heart size. No pericardial effusion. Mediastinum/Nodes: No enlarged mediastinal, hilar, or axillary lymph nodes. Thyroid gland, trachea, and esophagus demonstrate no significant findings. Lungs/Pleura: Emphysematous changes of the lungs. No focal consolidation or pleural effusion. Musculoskeletal: Sternotomy wires were noted. No acute osseous abnormality. Review of the MIP images confirms the above findings. CTA ABDOMEN AND PELVIS FINDINGS VASCULAR Aorta: Moderate atherosclerotic calcification of abdominal aorta. Focal dilatation of the infrarenal abdominal aorta measuring 2.5 x 2.9 cm. Celiac: Patent without evidence of aneurysm, dissection, vasculitis or significant stenosis. SMA: Patent without evidence of aneurysm, dissection, vasculitis or significant stenosis. Renals: Both renal arteries  are patent without evidence of aneurysm, dissection, vasculitis, fibromuscular dysplasia or significant stenosis. IMA: Patent without evidence of aneurysm, dissection, vasculitis or significant stenosis. Inflow: Patent without evidence of aneurysm, dissection, vasculitis or significant stenosis. Veins: No obvious venous abnormality within the limitations of this arterial phase study. Review of the MIP images confirms the above findings. NON-VASCULAR Hepatobiliary: Multiple hepatic lesions with peripheral discontinuous enhancement suggesting hemangiomas. No gallstones, gallbladder wall thickening, or biliary dilatation. Pancreas: Unremarkable. No pancreatic ductal dilatation or surrounding inflammatory changes. Spleen: Normal in size without focal abnormality. Adrenals/Urinary Tract: Symmetric perfusion of bilateral kidneys. Mild bilateral renal cortical atrophy. Right parapelvic renal  cysts. No evidence of hydronephrosis. Thickening of the urinary bladder wall. Stomach/Bowel: Stomach is within normal limits. Appendix appears normal. No evidence of bowel wall thickening, distention, or inflammatory changes. Lymphatic: No lymphadenopathy. Reproductive: Prostate is unremarkable. Other: No abdominal wall hernia or abnormality. No abdominopelvic ascites. Musculoskeletal: Advanced levoscoliosis centered at L1 vertebral body. Mild bilateral hip osteoarthritis. No acute fracture or dislocation. Review of the MIP images confirms the above findings. IMPRESSION: 1.  No evidence of aortic dissection or acute vascular abnormality. 2. Emphysematous changes of the lungs without evidence of pneumonia or pulmonary edema. 3. Moderate atherosclerotic disease of abdominal aorta and branch vessels. Mildly dilated infrarenal abdominal aorta measuring 2.5 x 2.9 cm. Follow-up examination five years is recommended. 5. Thickening of the urinary bladder wall suggesting cystitis, correlate with urinalysis. 6. Moderate levoscoliosis of the lumbar spine centered at L1 vertebral body. Degenerate disc disease of the lumbar spine. Bilateral hip osteoarthritis. 6.  Additional chronic findings as above. Electronically Signed   By: Keane Police D.O.   On: 11/16/2021 13:12   CT Renal Stone Study  Result Date: 11/16/2021 CLINICAL DATA:  Lower back pain and abdominal pain. EXAM: CT ABDOMEN AND PELVIS WITHOUT CONTRAST TECHNIQUE: Multidetector CT imaging of the abdomen and pelvis was performed following the standard protocol without IV contrast. RADIATION DOSE REDUCTION: This exam was performed according to the departmental dose-optimization program which includes automated exposure control, adjustment of the mA and/or kV according to patient size and/or use of iterative reconstruction technique. COMPARISON:  CT abdomen/pelvis 04/08/2018 FINDINGS: Lower chest: The lung bases are clear. The imaged heart is unremarkable.  Hepatobiliary: Hypodense lesions in the liver are again seen consistent with hemangiomas, unchanged. There are small layering gallstones in the gallbladder. There is no gallbladder wall thickening. There is no biliary ductal dilatation. Pancreas: Grossly unremarkable. Spleen: Unremarkable. Adrenals/Urinary Tract: The adrenals are unremarkable. Right renal atrophy and cortical thinning is unchanged. Chronic right hydronephrosis and proximal hydroureter is unchanged. The distal right ureter is not definitely seen. No obstructing lesion or stone is seen. Fullness of the left renal pelvis is unchanged without frank hydronephrosis or hydroureter. No obstructing stone is seen on the left. There is mild diffuse bladder wall thickening. Stomach/Bowel: There is thickening of the stomach wall as well as thickening of the proximal small bowel wall, overall similar to the prior study from 04/08/2021. There is no convincing evidence of large bowel wall thickening or inflammation. There is no evidence of mechanical obstruction. The appendix is not definitively identified. Vascular/Lymphatic: There is extensive calcified atherosclerotic plaque throughout the mildly aneurysmal abdominal aorta measuring up to 3.0 cm. There is no abdominal or pelvic lymphadenopathy. Reproductive: The prostate and seminal vesicles are unremarkable. Other: There is small volume ascites in the abdomen. There is no definite organized or drainable fluid collection. There is no free intraperitoneal air. Musculoskeletal: There is no  acute osseous abnormality or suspicious osseous lesion. Levoscoliosis is again seen. There has been prior instrumentation of the proximal right femur. IMPRESSION: 1. Thickening of the stomach wall and proximal small bowel wall may reflect gastroenteritis in the correct clinical setting but could also reflect a component of fluid overload. 2. Small volume ascites. 3. Unchanged right renal atrophy and chronic hydronephrosis and  proximal hydroureter. No obstructing lesion or stone is seen along the course of either ureter. 4. Small gallstones without evidence of acute cholecystitis. 5. Mildly aneurysmal abdominal aorta measuring up to 3.0 cm. Recommend follow-up ultrasound every 3 years. Electronically Signed   By: Valetta Mole M.D.   On: 11/16/2021 11:23    Labs on Admission: I have personally reviewed following labs  CBC: Recent Labs  Lab 11/16/21 1052  WBC 10.5  NEUTROABS 7.8*  HGB 12.0*  HCT 39.9  MCV 94.1  PLT 458   Basic Metabolic Panel: Recent Labs  Lab 11/16/21 1052  NA 143  K 5.8*  CL 107  CO2 20*  GLUCOSE 97  BUN 86*  CREATININE 8.21*  CALCIUM 7.8*  MG 2.7*   GFR: Estimated Creatinine Clearance: 7.3 mL/min (A) (by C-G formula based on SCr of 8.21 mg/dL (H)).  Liver Function Tests: Recent Labs  Lab 11/16/21 1052  AST 26  ALT 37  ALKPHOS 60  BILITOT 0.7  PROT 7.9  ALBUMIN 4.2   Coagulation Profile: Recent Labs  Lab 11/16/21 1052  INR 1.0   Urine analysis:    Component Value Date/Time   COLORURINE STRAW (A) 11/16/2021 1135   APPEARANCEUR CLEAR (A) 11/16/2021 1135   APPEARANCEUR Cloudy (A) 05/08/2019 1448   LABSPEC 1.009 11/16/2021 1135   PHURINE 7.0 11/16/2021 1135   GLUCOSEU 150 (A) 11/16/2021 1135   HGBUR NEGATIVE 11/16/2021 1135   BILIRUBINUR NEGATIVE 11/16/2021 1135   BILIRUBINUR Negative 05/08/2019 1448   KETONESUR NEGATIVE 11/16/2021 1135   PROTEINUR 100 (A) 11/16/2021 1135   NITRITE NEGATIVE 11/16/2021 1135   LEUKOCYTESUR NEGATIVE 11/16/2021 1135   Dr. Tobie Poet [facility]  If 7PM-7AM, please contact overnight-coverage provider If 7AM-7PM, please contact day coverage provider [email or web address]  11/16/2021, 8:07 PM

## 2021-11-16 NOTE — Assessment & Plan Note (Signed)
-   Atorvastatin 40 mg daily resumed 

## 2021-11-16 NOTE — Assessment & Plan Note (Addendum)
-   Sacubitril-valsartan 24-26 mg tablet, 1 tablet twice daily resumed in the emergency department however with med reconciliation, patient's Anthony Hess has been discontinued by provider therefore I have not resumed this on admission - Resumed home amlodipine 10 mg daily - Hydralazine 10 mg IV every 8 hours as needed for SBP greater than 180, 2 days ordered

## 2021-11-16 NOTE — Hospital Course (Signed)
[name] is a 66 year old male with end-stage renal disease on hemodialysis, hypertension, hyperlipidemia, who presents emergency department for chief concerns of left flank pain.  Initial vitals in the emergency department showed temperature 98.9, respiration rate of 18, heart rate 67, blood pressure 204/119, SPO2 of 100% on room air.  Serum sodium is 143, potassium 4.8, chloride 107, bicarb 20, BUN of 97, serum creatinine of 8.21, nonfasting blood glucose 97, WBC 10.5, hemoglobin 12, platelets of 308.  High sensitivity troponin is 19.  Lactic acid is 1.1.  UA was negative for leukocytes and nitrates.  COVID by PCR was negative.  Magnesium was elevated at 2.7.  ED treatment: Fentanyl 50 mcg, ondansetron 4 mg IV, pantoprazole 40 mg IV one-time dose

## 2021-11-16 NOTE — ED Notes (Signed)
Patient transported to CT 

## 2021-11-16 NOTE — Progress Notes (Signed)
[facility]  ROUNDING NOTE   Subjective:   Anthony Hess is a 65 year old male with past medical conditions including CAD, bladder cancer, hyperlipidemia, hypertension, PVD, and end-stage renal disease on hemodialysis.  Patient presents to the emergency department with left flank pain and has been admitted for Hypertensive urgency [I16.0]   Patient is known to our practice and receives outpatient dialysis treatments at [facility] on a TTS schedule, supervised by [facility] physicians.  Patient received partial treatment on Saturday due to catheter malfunctioning.  Patient also states he ran out of his blood pressure medication.  Patient seen resting quietly on stretcher.  Currently denies pain or discomfort.  Alert and oriented.  Room air.  No lower extremity edema.  Labs on ED arrival include potassium 5.8, serum bicarb 20, BUN 86, creatinine 8.21 with GFR 7, and hemoglobin 12.0.  Respiratory panel negative for  COVID-19.  UA shows moderate proteinuria.  CT renal stone negative for obstruction.  CT angio chest shows emphysema mildly dilated abdominal aorta with follow-up recommended in a moderate lumbar spine levosccoliosis.  We have been consulted to manage dialysis needs.   Objective:  Vital signs in last 24 hours:  Temp:  [97.7 F (36.5 C)-98.9 F (37.2 C)] 97.7 F (36.5 C) (09/25 1425) Pulse Rate:  [61-75] 69 (09/25 1645) Resp:  [15-22] 17 (09/25 1645) BP: (181-228)/(114-146) 198/126 (09/25 1645) SpO2:  [94 %-100 %] 100 % (09/25 1645) Weight:  [57 kg-57.6 kg] 57 kg (09/25 1424)  Weight change:  Filed Weights   11/16/21 1029 11/16/21 1424  Weight: 57.6 kg 57 kg    Intake/Output: No intake/output data recorded.   Intake/Output this shift:  No intake/output data recorded.  Physical Exam: General: NAD  Head: Normocephalic, atraumatic. Moist oral mucosal membranes  Eyes: Anicteric  Neck: Supple  Lungs:  Clear to auscultation, normal effort  Heart: Regular rate  and rhythm  Abdomen:  Soft, nontender, nondistended  Extremities:  No  peripheral edema.  Neurologic: Nonfocal, moving all four extremities  Skin: No lesions  Access: Rt permcath    Basic Metabolic Panel: Recent Labs  Lab 11/16/21 1052  NA 143  K 5.8*  CL 107  CO2 20*  GLUCOSE 97  BUN 86*  CREATININE 8.21*  CALCIUM 7.8*  MG 2.7*    Liver Function Tests: Recent Labs  Lab 11/16/21 1052  AST 26  ALT 37  ALKPHOS 60  BILITOT 0.7  PROT 7.9  ALBUMIN 4.2   No results for input(s): "LIPASE", "AMYLASE" in the last 168 hours. No results for input(s): "AMMONIA" in the last 168 hours.  CBC: Recent Labs  Lab 11/16/21 1052  WBC 10.5  NEUTROABS 7.8*  HGB 12.0*  HCT 39.9  MCV 94.1  PLT 308    Cardiac Enzymes: No results for input(s): "CKTOTAL", "CKMB", "CKMBINDEX", "TROPONINI" in the last 168 hours.  BNP: Invalid input(s): "POCBNP"  CBG: No results for input(s): "GLUCAP" in the last 168 hours.  Microbiology: Results for orders placed or performed during the hospital encounter of 11/16/21  SARS Coronavirus 2 by RT PCR (hospital order, performed in [facility] hospital lab) *cepheid single result test* Anterior Nasal Swab     Status: None   Collection Time: 11/16/21 11:35 AM   Specimen: Anterior Nasal Swab  Result Value Ref Range Status   SARS Coronavirus 2 by RT PCR NEGATIVE NEGATIVE Final    Comment: (NOTE) SARS-CoV-2 target nucleic acids are NOT DETECTED.  The SARS-CoV-2 RNA is generally detectable in upper and  lower respiratory specimens during the acute phase of infection. The lowest concentration of SARS-CoV-2 viral copies this assay can detect is 250 copies / mL. A negative result does not preclude SARS-CoV-2 infection and should not be used as the sole basis for treatment or other patient management decisions.  A negative result may occur with improper specimen collection / handling, submission of specimen other than nasopharyngeal swab, presence of  viral mutation(s) within the areas targeted by this assay, and inadequate number of viral copies (<250 copies / mL). A negative result must be combined with clinical observations, patient history, and epidemiological information.  Fact Sheet for Patients:   https://www.patel.info/  Fact Sheet for Healthcare Providers: https://hall.com/  This test is not yet approved or  cleared by the [place] FDA and has been authorized for detection and/or diagnosis of SARS-CoV-2 by FDA under an Emergency Use Authorization (EUA).  This EUA will remain in effect (meaning this test can be used) for the duration of the COVID-19 declaration under Section 564(b)(1) of the Act, 21 U.S.C. section 360bbb-3(b)(1), unless the authorization is terminated or revoked sooner.  Performed at [facility], [address] [place], [place] [address]     Coagulation Studies: Recent Labs    11/16/21 1052  LABPROT 13.0  INR 1.0    Urinalysis: Recent Labs    11/16/21 1135  COLORURINE STRAW*  LABSPEC 1.009  PHURINE 7.0  GLUCOSEU 150*  HGBUR NEGATIVE  BILIRUBINUR NEGATIVE  KETONESUR NEGATIVE  PROTEINUR 100*  NITRITE NEGATIVE  LEUKOCYTESUR NEGATIVE      Imaging: CT Angio Chest/Abd/Pel for Dissection W and/or Wo Contrast  Result Date: 11/16/2021 CLINICAL DATA:  Acute aortic syndrome suspected. EXAM: CT ANGIOGRAPHY CHEST, ABDOMEN AND PELVIS TECHNIQUE: Non-contrast CT of the chest was initially obtained. Multidetector CT imaging through the chest, abdomen and pelvis was performed using the standard protocol during bolus administration of intravenous contrast. Multiplanar reconstructed images and MIPs were obtained and reviewed to evaluate the vascular anatomy. RADIATION DOSE REDUCTION: This exam was performed according to the departmental dose-optimization program which includes automated exposure control, adjustment of the mA and/or kV according to  patient size and/or use of iterative reconstruction technique. CONTRAST:  57m OMNIPAQUE IOHEXOL 350 MG/ML SOLN COMPARISON:  None Available. FINDINGS: CTA CHEST FINDINGS Cardiovascular: Preferential opacification of the thoracic aorta. No evidence of thoracic aortic aneurysm or dissection. Normal heart size. No pericardial effusion. Mediastinum/Nodes: No enlarged mediastinal, hilar, or axillary lymph nodes. Thyroid gland, trachea, and esophagus demonstrate no significant findings. Lungs/Pleura: Emphysematous changes of the lungs. No focal consolidation or pleural effusion. Musculoskeletal: Sternotomy wires were noted. No acute osseous abnormality. Review of the MIP images confirms the above findings. CTA ABDOMEN AND PELVIS FINDINGS VASCULAR Aorta: Moderate atherosclerotic calcification of abdominal aorta. Focal dilatation of the infrarenal abdominal aorta measuring 2.5 x 2.9 cm. Celiac: Patent without evidence of aneurysm, dissection, vasculitis or significant stenosis. SMA: Patent without evidence of aneurysm, dissection, vasculitis or significant stenosis. Renals: Both renal arteries are patent without evidence of aneurysm, dissection, vasculitis, fibromuscular dysplasia or significant stenosis. IMA: Patent without evidence of aneurysm, dissection, vasculitis or significant stenosis. Inflow: Patent without evidence of aneurysm, dissection, vasculitis or significant stenosis. Veins: No obvious venous abnormality within the limitations of this arterial phase study. Review of the MIP images confirms the above findings. NON-VASCULAR Hepatobiliary: Multiple hepatic lesions with peripheral discontinuous enhancement suggesting hemangiomas. No gallstones, gallbladder wall thickening, or biliary dilatation. Pancreas: Unremarkable. No pancreatic ductal dilatation or surrounding inflammatory changes. Spleen: Normal in  size without focal abnormality. Adrenals/Urinary Tract: Symmetric perfusion of bilateral kidneys. Mild  bilateral renal cortical atrophy. Right parapelvic renal cysts. No evidence of hydronephrosis. Thickening of the urinary bladder wall. Stomach/Bowel: Stomach is within normal limits. Appendix appears normal. No evidence of bowel wall thickening, distention, or inflammatory changes. Lymphatic: No lymphadenopathy. Reproductive: Prostate is unremarkable. Other: No abdominal wall hernia or abnormality. No abdominopelvic ascites. Musculoskeletal: Advanced levoscoliosis centered at L1 vertebral body. Mild bilateral hip osteoarthritis. No acute fracture or dislocation. Review of the MIP images confirms the above findings. IMPRESSION: 1.  No evidence of aortic dissection or acute vascular abnormality. 2. Emphysematous changes of the lungs without evidence of pneumonia or pulmonary edema. 3. Moderate atherosclerotic disease of abdominal aorta and branch vessels. Mildly dilated infrarenal abdominal aorta measuring 2.5 x 2.9 cm. Follow-up examination five years is recommended. 5. Thickening of the urinary bladder wall suggesting cystitis, correlate with urinalysis. 6. Moderate levoscoliosis of the lumbar spine centered at L1 vertebral body. Degenerate disc disease of the lumbar spine. Bilateral hip osteoarthritis. 6.  Additional chronic findings as above. Electronically Signed   By: Keane Police D.O.   On: 11/16/2021 13:12   CT Renal Stone Study  Result Date: 11/16/2021 CLINICAL DATA:  Lower back pain and abdominal pain. EXAM: CT ABDOMEN AND PELVIS WITHOUT CONTRAST TECHNIQUE: Multidetector CT imaging of the abdomen and pelvis was performed following the standard protocol without IV contrast. RADIATION DOSE REDUCTION: This exam was performed according to the departmental dose-optimization program which includes automated exposure control, adjustment of the mA and/or kV according to patient size and/or use of iterative reconstruction technique. COMPARISON:  CT abdomen/pelvis 04/08/2018 FINDINGS: Lower chest: The lung bases  are clear. The imaged heart is unremarkable. Hepatobiliary: Hypodense lesions in the liver are again seen consistent with hemangiomas, unchanged. There are small layering gallstones in the gallbladder. There is no gallbladder wall thickening. There is no biliary ductal dilatation. Pancreas: Grossly unremarkable. Spleen: Unremarkable. Adrenals/Urinary Tract: The adrenals are unremarkable. Right renal atrophy and cortical thinning is unchanged. Chronic right hydronephrosis and proximal hydroureter is unchanged. The distal right ureter is not definitely seen. No obstructing lesion or stone is seen. Fullness of the left renal pelvis is unchanged without frank hydronephrosis or hydroureter. No obstructing stone is seen on the left. There is mild diffuse bladder wall thickening. Stomach/Bowel: There is thickening of the stomach wall as well as thickening of the proximal small bowel wall, overall similar to the prior study from 04/08/2021. There is no convincing evidence of large bowel wall thickening or inflammation. There is no evidence of mechanical obstruction. The appendix is not definitively identified. Vascular/Lymphatic: There is extensive calcified atherosclerotic plaque throughout the mildly aneurysmal abdominal aorta measuring up to 3.0 cm. There is no abdominal or pelvic lymphadenopathy. Reproductive: The prostate and seminal vesicles are unremarkable. Other: There is small volume ascites in the abdomen. There is no definite organized or drainable fluid collection. There is no free intraperitoneal air. Musculoskeletal: There is no acute osseous abnormality or suspicious osseous lesion. Levoscoliosis is again seen. There has been prior instrumentation of the proximal right femur. IMPRESSION: 1. Thickening of the stomach wall and proximal small bowel wall may reflect gastroenteritis in the correct clinical setting but could also reflect a component of fluid overload. 2. Small volume ascites. 3. Unchanged right  renal atrophy and chronic hydronephrosis and proximal hydroureter. No obstructing lesion or stone is seen along the course of either ureter. 4. Small gallstones without evidence of acute cholecystitis. 5. Mildly  aneurysmal abdominal aorta measuring up to 3.0 cm. Recommend follow-up ultrasound every 3 years. Electronically Signed   By: Valetta Mole M.D.   On: 11/16/2021 11:23     Medications:    anticoagulant sodium citrate      amLODipine  10 mg Oral Daily   aspirin EC  81 mg Oral Daily   [START ON 11/17/2021] atorvastatin  40 mg Oral BH-[id number]   calcitRIOL  0.5 mcg Oral Daily   calcium acetate  667 mg Oral TID WC   [START ON 11/17/2021] Chlorhexidine Gluconate Cloth  6 each Topical Q0600   feeding supplement (NEPRO CARB STEADY)  237 mL Oral TID BM   heparin  5,000 Units Subcutaneous Q8H   multivitamin  1 tablet Oral QHS   sucroferric oxyhydroxide  500 mg Oral TID WC   [START ON 11/17/2021] zinc sulfate  220 mg Oral Daily   acetaminophen **OR** acetaminophen, alteplase, anticoagulant sodium citrate, heparin, hydrALAZINE, lidocaine (PF), lidocaine-prilocaine, morphine injection, ondansetron **OR** ondansetron (ZOFRAN) IV, oxyCODONE-acetaminophen, pentafluoroprop-tetrafluoroeth, senna-docusate  Assessment/ Plan:  [name] is a 66 y.o.  male with past medical conditions including CAD, bladder cancer, hyperlipidemia, hypertension, PVD, and end-stage renal disease on hemodialysis.  Patient presents to the emergency department with left flank pain and has been admitted for Hypertensive urgency [I16.0]  [facility] [place]/TTS/ Rt permcath  Hyperkalemia with end stage renal disease on hemodialysis. Potassium 5.8. Will provide urgent dialysis to manage. Will assess permcath prior to treatment to determine patency. Cathflo ordered if needed. Will dialysis on 2K bath. UF goal 1L as tolerated. Next treatment scheduled for Tuesday to maintain outpatient schedule.   2. Anemia of chronic kidney  disease Lab Results  Component Value Date   HGB 12.0 (L) 11/16/2021    Hgb at goal  3. Secondary Hyperparathyroidism: Lab Results  Component Value Date   PTH 159 (H) 12/27/2019   CALCIUM 7.8 (L) 11/16/2021   CAION 0.84 (LL) 09/11/2021   PHOS 6.9 (H) 04/16/2021    Calcium decreased. Will recheck with am labs.  Prescribed calcitriol and calcium acetate outpatient.  4  Hypertension with chronic kidney disease.  Home regimen includes amlodipine and metoprolol.  Patient states he ran out of current prescription.  Blood pressure elevated on ED arrival.  Currently prescribed amlodipine with hydralazine ordered as needed.  Last blood pressure 211/124.    LOS: 0 Christopherjame Carnell 9/25/20234:[id number] PM

## 2021-11-16 NOTE — ED Provider Notes (Signed)
[facility] Provider Note    Event Date/Time   First MD Initiated Contact with Patient 11/16/21 1034     (approximate)   History   Back Pain and Vascular Access Problem   HPI  Anthony Hess is a 66 y.o. male with history of end-stage renal disease here with multiple complaints.  The patient states that he currently has a right subclavian catheter in place.  He states that he was having difficulty with access of this at dialysis on Saturday.  He was only able to sit for half the session.  He states that over the last day or so, he has had initially mild onset, progressively worsening left flank pain.  He has noted some pain with urination.  He only urinates a small amount every day.  This is new just over the last week, however.  Denies any falls.  Denies any lower extremity weakness or numbness.  Pain is not sharp or tearing.     Physical Exam   Triage Vital Signs: ED Triage Vitals  Enc Vitals Group     BP 11/16/21 1028 (!) 199/117     Pulse Rate 11/16/21 1028 67     Resp 11/16/21 1028 (!) 21     Temp 11/16/21 1028 98.9 F (37.2 C)     Temp Source 11/16/21 1028 Oral     SpO2 11/16/21 1028 100 %     Weight 11/16/21 1029 127 lb (57.6 kg)     Height 11/16/21 1029 '5\' 3"'$  (1.6 m)     Head Circumference --      Peak Flow --      Pain Score 11/16/21 1028 7     Pain Loc --      Pain Edu? --      Excl. in [place]? --     Most recent vital signs: Vitals:   11/16/21 1818 11/16/21 1833  BP: (!) 181/112 (!) 167/112  Pulse: 74 79  Resp: 20 (!) 21  Temp:  98.6 F (37 C)  SpO2: 98% 99%     General: Awake, no distress.  CV:  Good peripheral perfusion.  Regular rate and rhythm. Resp:  Normal effort.  Abd:  No distention.  Moderate left flank tenderness.  No distention. Other:  Right subclavian catheter in place, no bleeding, erythema, swelling.   ED Results / Procedures / Treatments   Labs (all labs ordered are listed, but only abnormal results are  displayed) Labs Reviewed  CBC WITH DIFFERENTIAL/PLATELET - Abnormal; Notable for the following components:      Result Value   Hemoglobin 12.0 (*)    RDW 16.3 (*)    Neutro Abs 7.8 (*)    Eosinophils Absolute 0.6 (*)    All other components within normal limits  COMPREHENSIVE METABOLIC PANEL - Abnormal; Notable for the following components:   Potassium 5.8 (*)    CO2 20 (*)    BUN 86 (*)    Creatinine, Ser 8.21 (*)    Calcium 7.8 (*)    GFR, Estimated 7 (*)    Anion gap 16 (*)    All other components within normal limits  MAGNESIUM - Abnormal; Notable for the following components:   Magnesium 2.7 (*)    All other components within normal limits  URINALYSIS, ROUTINE W REFLEX MICROSCOPIC - Abnormal; Notable for the following components:   Color, Urine STRAW (*)    APPearance CLEAR (*)    Glucose, UA 150 (*)    Protein,  ur 100 (*)    All other components within normal limits  TROPONIN I (HIGH SENSITIVITY) - Abnormal; Notable for the following components:   Troponin I (High Sensitivity) 19 (*)    All other components within normal limits  SARS CORONAVIRUS 2 BY RT PCR  PROTIME-INR  LACTIC ACID, PLASMA  HEPATITIS B SURFACE ANTIGEN  HEPATITIS B SURFACE ANTIBODY,QUALITATIVE  HEPATITIS B SURFACE ANTIBODY, QUANTITATIVE  HEPATITIS B CORE ANTIBODY, TOTAL  HEPATITIS C ANTIBODY  BASIC METABOLIC PANEL  CBC     EKG Normal sinus rhythm, ventricular rate 69.  PR 120, QRS 106, QTc 492.  Prolonged QT interval.  Slight peaked T waves.   RADIOLOGY CT renal stone: Thickening of the stomach wall and proximal small bowel, possible gastroenteritis, small volume ascites, unchanged chronic hydronephrosis   I also independently reviewed and agree with radiologist interpretations.   PROCEDURES:  Critical Care performed: No  .1-3 Lead EKG Interpretation  Performed by: Duffy Bruce, MD Authorized by: Duffy Bruce, MD     Interpretation: normal     ECG rate:  60-80   ECG rate  assessment: normal     Rhythm: sinus rhythm     Ectopy: none     Conduction: normal   Comments:     Indication: Weakness, back pain, HD patient     [place] [facility]: Medications  Chlorhexidine Gluconate Cloth 2 % PADS 6 each (has no administration in time range)  pentafluoroprop-tetrafluoroeth (GEBAUERS) aerosol 1 Application (has no administration in time range)  lidocaine (PF) (XYLOCAINE) 1 % injection 5 mL (has no administration in time range)  lidocaine-prilocaine (EMLA) cream 1 Application (has no administration in time range)  heparin injection 1,000 Units (has no administration in time range)  anticoagulant sodium citrate solution 5 mL (has no administration in time range)  alteplase (CATHFLO ACTIVASE) injection 2 mg (has no administration in time range)  acetaminophen (TYLENOL) tablet 1,000 mg (has no administration in time range)    Or  acetaminophen (TYLENOL) suppository 650 mg (has no administration in time range)  ondansetron (ZOFRAN) tablet 4 mg (has no administration in time range)    Or  ondansetron (ZOFRAN) injection 4 mg (has no administration in time range)  heparin injection 5,000 Units (5,000 Units Subcutaneous Given 11/16/21 1851)  senna-docusate (Senokot-S) tablet 1 tablet (has no administration in time range)  oxyCODONE-acetaminophen (PERCOCET/ROXICET) 5-325 MG per tablet 1-2 tablet (1 tablet Oral Given 11/16/21 1855)  morphine (PF) 4 MG/ML injection 4 mg (has no administration in time range)  hydrALAZINE (APRESOLINE) injection 10 mg (has no administration in time range)  aspirin EC tablet 81 mg (has no administration in time range)  atorvastatin (LIPITOR) tablet 40 mg (has no administration in time range)  calcitRIOL (ROCALTROL) capsule 0.5 mcg (has no administration in time range)  calcium acetate (PHOSLO) capsule 667 mg (has no administration in time range)  sucroferric oxyhydroxide (VELPHORO) chewable tablet 500 mg (has no administration in time  range)  multivitamin (RENA-VIT) tablet 1 tablet (has no administration in time range)  feeding supplement (NEPRO CARB STEADY) liquid 237 mL (237 mLs Oral Not Given 11/16/21 1600)  zinc sulfate capsule 220 mg (has no administration in time range)  amLODipine (NORVASC) tablet 10 mg (10 mg Oral Given 11/16/21 1851)  fentaNYL (SUBLIMAZE) injection 50 mcg (50 mcg Intravenous Given 11/16/21 1130)  ondansetron (ZOFRAN) injection 4 mg (4 mg Intravenous Given 11/16/21 1130)  alum & mag hydroxide-simeth (MAALOX/MYLANTA) 200-200-20 MG/5ML suspension 30 mL (30 mLs Oral Given 11/16/21  1159)  pantoprazole (PROTONIX) injection 40 mg (40 mg Intravenous Given 11/16/21 1159)  iohexol (OMNIPAQUE) 350 MG/ML injection 75 mL (75 mLs Intravenous Contrast Given 11/16/21 1231)     IMPRESSION / MDM / ASSESSMENT AND PLAN / ED COURSE  I reviewed the triage vital signs and the nursing notes.                               The patient is on the cardiac monitor to evaluate for evidence of arrhythmia and/or significant heart rate changes.   Ddx:  Differential includes the following, with pertinent life- or limb-threatening emergencies considered:  Hypertensive urgency, dissection, AAA, gastritis, UTI with pyelonephritis, ACS, PNA  Patient's presentation is most consistent with acute presentation with potential threat to life or bodily function.  MDM:  66 yo M with h/o ESRD here with difficulty accessing [facility] port, general fatigue, and abdominal pain. Pt markedly hypertensive here, with mild abd pain. CMP shows hyperkalemia and EKG has slightly peaked TW. CBC without leukocytosis. CT a/p wo initially obtained for eval of stone, hydro, RP hematoma, other etiology. Pt noted to have AAA on CT. Given his profound HTN with tearing back pain, CT angio also obtained and fortunately is negative. Discussed with Nephrology, will admit for HD and management of HTN urgency.   MEDICATIONS GIVEN IN ED: Medications   Chlorhexidine Gluconate Cloth 2 % PADS 6 each (has no administration in time range)  pentafluoroprop-tetrafluoroeth (GEBAUERS) aerosol 1 Application (has no administration in time range)  lidocaine (PF) (XYLOCAINE) 1 % injection 5 mL (has no administration in time range)  lidocaine-prilocaine (EMLA) cream 1 Application (has no administration in time range)  heparin injection 1,000 Units (has no administration in time range)  anticoagulant sodium citrate solution 5 mL (has no administration in time range)  alteplase (CATHFLO ACTIVASE) injection 2 mg (has no administration in time range)  acetaminophen (TYLENOL) tablet 1,000 mg (has no administration in time range)    Or  acetaminophen (TYLENOL) suppository 650 mg (has no administration in time range)  ondansetron (ZOFRAN) tablet 4 mg (has no administration in time range)    Or  ondansetron (ZOFRAN) injection 4 mg (has no administration in time range)  heparin injection 5,000 Units (5,000 Units Subcutaneous Given 11/16/21 1851)  senna-docusate (Senokot-S) tablet 1 tablet (has no administration in time range)  oxyCODONE-acetaminophen (PERCOCET/ROXICET) 5-325 MG per tablet 1-2 tablet (1 tablet Oral Given 11/16/21 1855)  morphine (PF) 4 MG/ML injection 4 mg (has no administration in time range)  hydrALAZINE (APRESOLINE) injection 10 mg (has no administration in time range)  aspirin EC tablet 81 mg (has no administration in time range)  atorvastatin (LIPITOR) tablet 40 mg (has no administration in time range)  calcitRIOL (ROCALTROL) capsule 0.5 mcg (has no administration in time range)  calcium acetate (PHOSLO) capsule 667 mg (has no administration in time range)  sucroferric oxyhydroxide (VELPHORO) chewable tablet 500 mg (has no administration in time range)  multivitamin (RENA-VIT) tablet 1 tablet (has no administration in time range)  feeding supplement (NEPRO CARB STEADY) liquid 237 mL (237 mLs Oral Not Given 11/16/21 1600)  zinc sulfate  capsule 220 mg (has no administration in time range)  amLODipine (NORVASC) tablet 10 mg (10 mg Oral Given 11/16/21 1851)  fentaNYL (SUBLIMAZE) injection 50 mcg (50 mcg Intravenous Given 11/16/21 1130)  ondansetron (ZOFRAN) injection 4 mg (4 mg Intravenous Given 11/16/21 1130)  alum & mag hydroxide-simeth (MAALOX/MYLANTA)  200-200-20 MG/5ML suspension 30 mL (30 mLs Oral Given 11/16/21 1159)  pantoprazole (PROTONIX) injection 40 mg (40 mg Intravenous Given 11/16/21 1159)  iohexol (OMNIPAQUE) 350 MG/ML injection 75 mL (75 mLs Intravenous Contrast Given 11/16/21 1231)     Consults:  Nephrology Hospitalist   EMR reviewed      FINAL CLINICAL IMPRESSION(S) / ED DIAGNOSES   Final diagnoses:  Hypertensive urgency  ESRD (end stage renal disease) ([place])     Rx / DC Orders   ED Discharge Orders     None        Note:  This document was prepared using Dragon voice recognition software and may include unintentional dictation errors.   Duffy Bruce, MD 11/16/21 Lurena Nida

## 2021-11-17 DIAGNOSIS — I714 Abdominal aortic aneurysm, without rupture, unspecified: Secondary | ICD-10-CM | POA: Diagnosis present

## 2021-11-17 DIAGNOSIS — R748 Abnormal levels of other serum enzymes: Secondary | ICD-10-CM | POA: Diagnosis not present

## 2021-11-17 DIAGNOSIS — E875 Hyperkalemia: Secondary | ICD-10-CM | POA: Diagnosis present

## 2021-11-17 DIAGNOSIS — N2581 Secondary hyperparathyroidism of renal origin: Secondary | ICD-10-CM | POA: Diagnosis present

## 2021-11-17 DIAGNOSIS — Z7982 Long term (current) use of aspirin: Secondary | ICD-10-CM | POA: Diagnosis not present

## 2021-11-17 DIAGNOSIS — Z8616 Personal history of COVID-19: Secondary | ICD-10-CM | POA: Diagnosis not present

## 2021-11-17 DIAGNOSIS — I739 Peripheral vascular disease, unspecified: Secondary | ICD-10-CM | POA: Diagnosis present

## 2021-11-17 DIAGNOSIS — I252 Old myocardial infarction: Secondary | ICD-10-CM | POA: Diagnosis not present

## 2021-11-17 DIAGNOSIS — Z79899 Other long term (current) drug therapy: Secondary | ICD-10-CM | POA: Diagnosis not present

## 2021-11-17 DIAGNOSIS — Z87442 Personal history of urinary calculi: Secondary | ICD-10-CM | POA: Diagnosis not present

## 2021-11-17 DIAGNOSIS — Z8551 Personal history of malignant neoplasm of bladder: Secondary | ICD-10-CM | POA: Diagnosis not present

## 2021-11-17 DIAGNOSIS — R109 Unspecified abdominal pain: Secondary | ICD-10-CM | POA: Diagnosis present

## 2021-11-17 DIAGNOSIS — E785 Hyperlipidemia, unspecified: Secondary | ICD-10-CM | POA: Diagnosis present

## 2021-11-17 DIAGNOSIS — I2510 Atherosclerotic heart disease of native coronary artery without angina pectoris: Secondary | ICD-10-CM | POA: Diagnosis present

## 2021-11-17 DIAGNOSIS — I420 Dilated cardiomyopathy: Secondary | ICD-10-CM | POA: Diagnosis present

## 2021-11-17 DIAGNOSIS — I160 Hypertensive urgency: Secondary | ICD-10-CM | POA: Diagnosis present

## 2021-11-17 DIAGNOSIS — I5022 Chronic systolic (congestive) heart failure: Secondary | ICD-10-CM | POA: Diagnosis present

## 2021-11-17 DIAGNOSIS — Z20822 Contact with and (suspected) exposure to covid-19: Secondary | ICD-10-CM | POA: Diagnosis present

## 2021-11-17 DIAGNOSIS — I700 Atherosclerosis of aorta: Secondary | ICD-10-CM | POA: Diagnosis present

## 2021-11-17 DIAGNOSIS — G8929 Other chronic pain: Secondary | ICD-10-CM | POA: Diagnosis present

## 2021-11-17 DIAGNOSIS — I120 Hypertensive chronic kidney disease with stage 5 chronic kidney disease or end stage renal disease: Secondary | ICD-10-CM | POA: Diagnosis not present

## 2021-11-17 DIAGNOSIS — N186 End stage renal disease: Secondary | ICD-10-CM | POA: Diagnosis present

## 2021-11-17 DIAGNOSIS — D631 Anemia in chronic kidney disease: Secondary | ICD-10-CM | POA: Diagnosis present

## 2021-11-17 DIAGNOSIS — Z87891 Personal history of nicotine dependence: Secondary | ICD-10-CM | POA: Diagnosis not present

## 2021-11-17 DIAGNOSIS — Z992 Dependence on renal dialysis: Secondary | ICD-10-CM | POA: Diagnosis not present

## 2021-11-17 DIAGNOSIS — R1084 Generalized abdominal pain: Secondary | ICD-10-CM | POA: Diagnosis not present

## 2021-11-17 DIAGNOSIS — I132 Hypertensive heart and chronic kidney disease with heart failure and with stage 5 chronic kidney disease, or end stage renal disease: Secondary | ICD-10-CM | POA: Diagnosis present

## 2021-11-17 LAB — CBC
HCT: 33.3 % — ABNORMAL LOW (ref 39.0–52.0)
Hemoglobin: 10.6 g/dL — ABNORMAL LOW (ref 13.0–17.0)
MCH: 28.6 pg (ref 26.0–34.0)
MCHC: 31.8 g/dL (ref 30.0–36.0)
MCV: 90 fL (ref 80.0–100.0)
Platelets: 243 10*3/uL (ref 150–400)
RBC: 3.7 MIL/uL — ABNORMAL LOW (ref 4.22–5.81)
RDW: 16 % — ABNORMAL HIGH (ref 11.5–15.5)
WBC: 7.5 10*3/uL (ref 4.0–10.5)
nRBC: 0 % (ref 0.0–0.2)

## 2021-11-17 LAB — PHOSPHORUS: Phosphorus: 7.4 mg/dL — ABNORMAL HIGH (ref 2.5–4.6)

## 2021-11-17 LAB — BASIC METABOLIC PANEL
Anion gap: 10 (ref 5–15)
BUN: 47 mg/dL — ABNORMAL HIGH (ref 8–23)
CO2: 25 mmol/L (ref 22–32)
Calcium: 6.9 mg/dL — ABNORMAL LOW (ref 8.9–10.3)
Chloride: 105 mmol/L (ref 98–111)
Creatinine, Ser: 5.32 mg/dL — ABNORMAL HIGH (ref 0.61–1.24)
GFR, Estimated: 11 mL/min — ABNORMAL LOW (ref >60)
Glucose, Bld: 93 mg/dL (ref 70–99)
Potassium: 3.7 mmol/L (ref 3.5–5.1)
Sodium: 140 mmol/L (ref 135–145)

## 2021-11-17 LAB — HEPATITIS B SURFACE ANTIBODY, QUANTITATIVE: Hep B S AB Quant (Post): 35.8 m[IU]/mL (ref >9.9)

## 2021-11-17 MED ORDER — CARVEDILOL 6.25 MG PO TABS
6.2500 mg | ORAL_TABLET | Freq: Two times a day (BID) | ORAL | Status: DC
  Administered 2021-11-18: 6.25 mg via ORAL
  Filled 2021-11-17: qty 1

## 2021-11-17 MED ORDER — HYDRALAZINE HCL 25 MG PO TABS
25.0000 mg | ORAL_TABLET | Freq: Three times a day (TID) | ORAL | Status: DC
  Administered 2021-11-17 – 2021-11-18 (×4): 25 mg via ORAL
  Filled 2021-11-17 (×4): qty 1

## 2021-11-17 MED ORDER — METOPROLOL SUCCINATE ER 50 MG PO TB24
50.0000 mg | ORAL_TABLET | Freq: Every day | ORAL | Status: DC
  Administered 2021-11-17: 50 mg via ORAL
  Filled 2021-11-17: qty 1

## 2021-11-17 MED ORDER — HEPARIN SODIUM (PORCINE) 1000 UNIT/ML IJ SOLN
INTRAMUSCULAR | Status: AC
  Administered 2021-11-17: 1000 [IU]
  Filled 2021-11-17: qty 10

## 2021-11-17 NOTE — Progress Notes (Signed)
Pt 3.5 HD complete w/ no complications. Alert, no c/o, stable, report to primary RN. Start: 0800 End: 1139 1038m fluid removed 83.9L BVP 54.5kg post HD standing weight No HD meds ordered

## 2021-11-17 NOTE — Progress Notes (Signed)
Post HD RN assessment 

## 2021-11-17 NOTE — Progress Notes (Signed)
PROGRESS NOTE    Anthony Hess   RDE:[id number] DOB: 1955/12/23  DOA: 11/16/2021 Date of Service: 11/17/21 PCP: Patient, No Pcp Per     Brief Narrative / Hospital Course:  Anthony Hess is a 66 year old male with end-stage renal disease on hemodialysis, hypertension, hyperlipidemia, who presents emergency department for chief concerns of left flank pain. Out of home BP meds.  09/25: Initial vitals in the emergency department showed temperature 98.9, respiration rate of 18, heart rate 67, blood pressure 204/119, SPO2 of 100% on room air. Serum sodium is 143, potassium 5.8, chloride 107, bicarb 20, BUN of 97, serum creatinine of 8.21, nonfasting blood glucose 97, WBC 10.5, hemoglobin 12, platelets of 308. High sensitivity troponin is 19.  Lactic acid is 1.1. UA was negative for leukocytes and nitrates.  COVID by PCR was negative.  Magnesium was elevated at 2.7. ED treatment: Fentanyl 50 mcg, ondansetron 4 mg IV, pantoprazole 40 mg IV one-time dose. Admitted for hyperkalemia and hypertensive urgency.      Consultants:  Nephrology  Procedures: Emergency dialysis to treat hyperkalemia, then routine dialysis       ASSESSMENT & PLAN:   Principal Problem:   Hypertensive urgency Active Problems:   Hypertension   S/P CABG x 4   ESRD (end stage renal disease) ([place])   Anemia in ESRD (end-stage renal disease) ([place])   Hyperlipidemia   Hyperparathyroidism due to renal insufficiency (HCC)   Presence of aortocoronary bypass graft   AAA (abdominal aortic aneurysm) (HCC)   Hyperkalemia   Chronic back pain   Hypertension Hypertensive urgency Sacubitril-valsartan 24-26 mg tablet, 1 tablet twice daily resumed in the emergency department however with med reconciliation, patient's Entresto had been discontinued  Resumed home amlodipine 10 mg daily Increased home metoprolol to 50 mg daily Hydralazine 10 mg IV every 8 hours as needed for SBP greater than 180, 2 days  ordered  Hyperlipidemia Atorvastatin 40 mg daily resumed  ESRD (end stage renal disease) [facility]) Nephrology has been consulted Dialysis   Hyperkalemia - corrected w/ dialysis Due to incomplete dialysis session on Saturday 11/14/2021 due to catheter malfunction Nephrology following  Chronic back pain Pain is unchanged from baseline Home medications: Oxycodone-acetaminophen 5-325 mg p.o., 1 to 2 tablets every 6 hours as needed for moderate and severe pain resumed    DVT prophylaxis: heparin Pertinent IV fluids/nutrition: no IV fluids Central lines / invasive devices: dialysis catheter   Code Status: FULL CODE Family Communication: none at this time  Disposition: inpatient TOC needs: pt is homeless, any resources available are appreciate it  Barriers to discharge / significant pending items: BP control, anticipate discharge readiness likely tomorrow             Subjective:  Patient reports feeling sore in lower and upper back, no chest pain or trouble breathing.        Objective:  Vitals:   11/17/21 1139 11/17/21 1143 11/17/21 1147 11/17/21 1504  BP: (!) 158/116 (!) 170/110  (!) 159/101  Pulse: 78 64  60  Resp: '17 13  17  '$ Temp: 98.2 F (36.8 C)   97.8 F (36.6 C)  TempSrc: Oral   Oral  SpO2: 96% 95%  96%  Weight:  54.5 kg 54.5 kg   Height:        Intake/Output Summary (Last 24 hours) at 11/17/2021 1532 Last data filed at 11/17/2021 1139 Gross per 24 hour  Intake --  Output 2000 ml  Net -2000 ml   [employer]  11/17/21 0756 11/17/21 1143 11/17/21 1147  Weight: 56.3 kg 54.5 kg 54.5 kg    Examination:  Constitutional:  VS as above General Appearance: alert, well-developed, thin/frail, NAD Ears, Nose, Mouth, Throat: MMM Neck: No masses, trachea midline Respiratory: Normal respiratory effort No wheeze No rhonchi Cardiovascular: S1/S2 normal No murmur No rub/gallop auscultated No lower extremity edema Gastrointestinal: No  tenderness Musculoskeletal:  No clubbing/cyanosis of digits Symmetrical movement in all extremities Neurological: No cranial nerve deficit on limited exam Alert Psychiatric: Normal judgment/insight Normal mood and affect       Scheduled Medications:   amLODipine  10 mg Oral Daily   aspirin EC  81 mg Oral Daily   atorvastatin  40 mg Oral BH-q7a   calcitRIOL  0.5 mcg Oral Daily   calcium acetate  667 mg Oral TID WC   Chlorhexidine Gluconate Cloth  6 each Topical Q0600   feeding supplement (NEPRO CARB STEADY)  237 mL Oral TID BM   heparin  5,000 Units Subcutaneous Q8H   metoprolol succinate  50 mg Oral Daily   multivitamin  1 tablet Oral QHS   sucroferric oxyhydroxide  500 mg Oral TID WC   zinc sulfate  220 mg Oral Daily    Continuous Infusions:  anticoagulant sodium citrate      PRN Medications:  acetaminophen **OR** acetaminophen, alteplase, anticoagulant sodium citrate, heparin, hydrALAZINE, lidocaine (PF), lidocaine-prilocaine, ondansetron **OR** ondansetron (ZOFRAN) IV, oxyCODONE-acetaminophen, pentafluoroprop-tetrafluoroeth, senna-docusate  Antimicrobials:  Anti-infectives (From admission, onward)    None       Data Reviewed: I have personally reviewed following labs and imaging studies  CBC: Recent Labs  Lab 11/16/21 1052 11/17/21 0551  WBC 10.5 7.5  NEUTROABS 7.8*  --   HGB 12.0* 10.6*  HCT 39.9 33.3*  MCV 94.1 90.0  PLT 308 209   Basic Metabolic Panel: Recent Labs  Lab 11/16/21 1052 11/17/21 0548 11/17/21 0551  NA 143  --  140  K 5.8*  --  3.7  CL 107  --  105  CO2 20*  --  25  GLUCOSE 97  --  93  BUN 86*  --  47*  CREATININE 8.21*  --  5.32*  CALCIUM 7.8*  --  6.9*  MG 2.7*  --   --   PHOS  --  7.4*  --    GFR: Estimated Creatinine Clearance: 11 mL/min (A) (by C-G formula based on SCr of 5.32 mg/dL (H)). Liver Function Tests: Recent Labs  Lab 11/16/21 1052  AST 26  ALT 37  ALKPHOS 60  BILITOT 0.7  PROT 7.9  ALBUMIN 4.2    No results for input(s): "LIPASE", "AMYLASE" in the last 168 hours. No results for input(s): "AMMONIA" in the last 168 hours. Coagulation Profile: Recent Labs  Lab 11/16/21 1052  INR 1.0   Cardiac Enzymes: No results for input(s): "CKTOTAL", "CKMB", "CKMBINDEX", "TROPONINI" in the last 168 hours. BNP (last 3 results) No results for input(s): "PROBNP" in the last 8760 hours. HbA1C: No results for input(s): "HGBA1C" in the last 72 hours. CBG: No results for input(s): "GLUCAP" in the last 168 hours. Lipid Profile: No results for input(s): "CHOL", "HDL", "LDLCALC", "TRIG", "CHOLHDL", "LDLDIRECT" in the last 72 hours. Thyroid Function Tests: No results for input(s): "TSH", "T4TOTAL", "FREET4", "T3FREE", "THYROIDAB" in the last 72 hours. Anemia Panel: No results for input(s): "VITAMINB12", "FOLATE", "FERRITIN", "TIBC", "IRON", "RETICCTPCT" in the last 72 hours. Urine analysis:    Component Value Date/Time   COLORURINE STRAW (A) 11/16/2021 1135  APPEARANCEUR CLEAR (A) 11/16/2021 1135   APPEARANCEUR Cloudy (A) 05/08/2019 1448   LABSPEC 1.009 11/16/2021 1135   PHURINE 7.0 11/16/2021 1135   GLUCOSEU 150 (A) 11/16/2021 1135   HGBUR NEGATIVE 11/16/2021 1135   BILIRUBINUR NEGATIVE 11/16/2021 1135   BILIRUBINUR Negative 05/08/2019 1448   KETONESUR NEGATIVE 11/16/2021 1135   PROTEINUR 100 (A) 11/16/2021 1135   NITRITE NEGATIVE 11/16/2021 1135   LEUKOCYTESUR NEGATIVE 11/16/2021 1135   Sepsis Labs: '@LABRCNTIP'$ (procalcitonin:4,lacticidven:4)  Recent Results (from the past 240 hour(s))  SARS Coronavirus 2 by RT PCR (hospital order, performed in [place] hospital lab) *cepheid single result test* Anterior Nasal Swab     Status: None   Collection Time: 11/16/21 11:35 AM   Specimen: Anterior Nasal Swab  Result Value Ref Range Status   SARS Coronavirus 2 by RT PCR NEGATIVE NEGATIVE Final    Comment: (NOTE) SARS-CoV-2 target nucleic acids are NOT DETECTED.  The SARS-CoV-2 RNA is  generally detectable in upper and lower respiratory specimens during the acute phase of infection. The lowest concentration of SARS-CoV-2 viral copies this assay can detect is 250 copies / mL. A negative result does not preclude SARS-CoV-2 infection and should not be used as the sole basis for treatment or other patient management decisions.  A negative result may occur with improper specimen collection / handling, submission of specimen other than nasopharyngeal swab, presence of viral mutation(s) within the areas targeted by this assay, and inadequate number of viral copies (<250 copies / mL). A negative result must be combined with clinical observations, patient history, and epidemiological information.  Fact Sheet for Patients:   https://www.patel.info/  Fact Sheet for Healthcare Providers: https://hall.com/  This test is not yet approved or  cleared by the [place] FDA and has been authorized for detection and/or diagnosis of SARS-CoV-2 by FDA under an Emergency Use Authorization (EUA).  This EUA will remain in effect (meaning this test can be used) for the duration of the COVID-19 declaration under Section 564(b)(1) of the Act, 21 U.S.C. section 360bbb-3(b)(1), unless the authorization is terminated or revoked sooner.  Performed at [facility], [address] [place], [place] [address]          Radiology Studies: CT Angio Chest/Abd/Pel for Dissection W and/or Wo Contrast  Result Date: 11/16/2021 CLINICAL DATA:  Acute aortic syndrome suspected. EXAM: CT ANGIOGRAPHY CHEST, ABDOMEN AND PELVIS TECHNIQUE: Non-contrast CT of the chest was initially obtained. Multidetector CT imaging through the chest, abdomen and pelvis was performed using the standard protocol during bolus administration of intravenous contrast. Multiplanar reconstructed images and MIPs were obtained and reviewed to evaluate the vascular anatomy.  RADIATION DOSE REDUCTION: This exam was performed according to the departmental dose-optimization program which includes automated exposure control, adjustment of the mA and/or kV according to patient size and/or use of iterative reconstruction technique. CONTRAST:  50m OMNIPAQUE IOHEXOL 350 MG/ML SOLN COMPARISON:  None Available. FINDINGS: CTA CHEST FINDINGS Cardiovascular: Preferential opacification of the thoracic aorta. No evidence of thoracic aortic aneurysm or dissection. Normal heart size. No pericardial effusion. Mediastinum/Nodes: No enlarged mediastinal, hilar, or axillary lymph nodes. Thyroid gland, trachea, and esophagus demonstrate no significant findings. Lungs/Pleura: Emphysematous changes of the lungs. No focal consolidation or pleural effusion. Musculoskeletal: Sternotomy wires were noted. No acute osseous abnormality. Review of the MIP images confirms the above findings. CTA ABDOMEN AND PELVIS FINDINGS VASCULAR Aorta: Moderate atherosclerotic calcification of abdominal aorta. Focal dilatation of the infrarenal abdominal aorta measuring 2.5 x 2.9 cm. Celiac: Patent without  evidence of aneurysm, dissection, vasculitis or significant stenosis. SMA: Patent without evidence of aneurysm, dissection, vasculitis or significant stenosis. Renals: Both renal arteries are patent without evidence of aneurysm, dissection, vasculitis, fibromuscular dysplasia or significant stenosis. IMA: Patent without evidence of aneurysm, dissection, vasculitis or significant stenosis. Inflow: Patent without evidence of aneurysm, dissection, vasculitis or significant stenosis. Veins: No obvious venous abnormality within the limitations of this arterial phase study. Review of the MIP images confirms the above findings. NON-VASCULAR Hepatobiliary: Multiple hepatic lesions with peripheral discontinuous enhancement suggesting hemangiomas. No gallstones, gallbladder wall thickening, or biliary dilatation. Pancreas: Unremarkable. No  pancreatic ductal dilatation or surrounding inflammatory changes. Spleen: Normal in size without focal abnormality. Adrenals/Urinary Tract: Symmetric perfusion of bilateral kidneys. Mild bilateral renal cortical atrophy. Right parapelvic renal cysts. No evidence of hydronephrosis. Thickening of the urinary bladder wall. Stomach/Bowel: Stomach is within normal limits. Appendix appears normal. No evidence of bowel wall thickening, distention, or inflammatory changes. Lymphatic: No lymphadenopathy. Reproductive: Prostate is unremarkable. Other: No abdominal wall hernia or abnormality. No abdominopelvic ascites. Musculoskeletal: Advanced levoscoliosis centered at L1 vertebral body. Mild bilateral hip osteoarthritis. No acute fracture or dislocation. Review of the MIP images confirms the above findings. IMPRESSION: 1.  No evidence of aortic dissection or acute vascular abnormality. 2. Emphysematous changes of the lungs without evidence of pneumonia or pulmonary edema. 3. Moderate atherosclerotic disease of abdominal aorta and branch vessels. Mildly dilated infrarenal abdominal aorta measuring 2.5 x 2.9 cm. Follow-up examination five years is recommended. 5. Thickening of the urinary bladder wall suggesting cystitis, correlate with urinalysis. 6. Moderate levoscoliosis of the lumbar spine centered at L1 vertebral body. Degenerate disc disease of the lumbar spine. Bilateral hip osteoarthritis. 6.  Additional chronic findings as above. Electronically Signed   By: Keane Police D.O.   On: 11/16/2021 13:12   CT Renal Stone Study  Result Date: 11/16/2021 CLINICAL DATA:  Lower back pain and abdominal pain. EXAM: CT ABDOMEN AND PELVIS WITHOUT CONTRAST TECHNIQUE: Multidetector CT imaging of the abdomen and pelvis was performed following the standard protocol without IV contrast. RADIATION DOSE REDUCTION: This exam was performed according to the departmental dose-optimization program which includes automated exposure control,  adjustment of the mA and/or kV according to patient size and/or use of iterative reconstruction technique. COMPARISON:  CT abdomen/pelvis 04/08/2018 FINDINGS: Lower chest: The lung bases are clear. The imaged heart is unremarkable. Hepatobiliary: Hypodense lesions in the liver are again seen consistent with hemangiomas, unchanged. There are small layering gallstones in the gallbladder. There is no gallbladder wall thickening. There is no biliary ductal dilatation. Pancreas: Grossly unremarkable. Spleen: Unremarkable. Adrenals/Urinary Tract: The adrenals are unremarkable. Right renal atrophy and cortical thinning is unchanged. Chronic right hydronephrosis and proximal hydroureter is unchanged. The distal right ureter is not definitely seen. No obstructing lesion or stone is seen. Fullness of the left renal pelvis is unchanged without frank hydronephrosis or hydroureter. No obstructing stone is seen on the left. There is mild diffuse bladder wall thickening. Stomach/Bowel: There is thickening of the stomach wall as well as thickening of the proximal small bowel wall, overall similar to the prior study from 04/08/2021. There is no convincing evidence of large bowel wall thickening or inflammation. There is no evidence of mechanical obstruction. The appendix is not definitively identified. Vascular/Lymphatic: There is extensive calcified atherosclerotic plaque throughout the mildly aneurysmal abdominal aorta measuring up to 3.0 cm. There is no abdominal or pelvic lymphadenopathy. Reproductive: The prostate and seminal vesicles are unremarkable. Other: There is small volume  ascites in the abdomen. There is no definite organized or drainable fluid collection. There is no free intraperitoneal air. Musculoskeletal: There is no acute osseous abnormality or suspicious osseous lesion. Levoscoliosis is again seen. There has been prior instrumentation of the proximal right femur. IMPRESSION: 1. Thickening of the stomach wall and  proximal small bowel wall may reflect gastroenteritis in the correct clinical setting but could also reflect a component of fluid overload. 2. Small volume ascites. 3. Unchanged right renal atrophy and chronic hydronephrosis and proximal hydroureter. No obstructing lesion or stone is seen along the course of either ureter. 4. Small gallstones without evidence of acute cholecystitis. 5. Mildly aneurysmal abdominal aorta measuring up to 3.0 cm. Recommend follow-up ultrasound every 3 years. Electronically Signed   By: Valetta Mole M.D.   On: 11/16/2021 11:23            LOS: 0 days      Emeterio Reeve, DO [facility] 11/17/2021, 3:32 PM   Staff may message me via secure chat in [place]  but this may not receive immediate response,  please page for urgent matters!  If 7PM-7AM, please contact night-coverage [email or web address]  Dictation software was used to generate the above note. Typos may occur and escape review, as with typed/written notes. Please contact Dr Sheppard Coil directly for clarity if needed.

## 2021-11-17 NOTE — Progress Notes (Signed)
Hemodialysis patient known at [facility] 6:00am. Patient informed that he and his girlfriend broke up about 2 or 3 months ago and he has been homeless ever since. Stated that he will sleep wherever he can, in parking lots or abandoned buildings. He stated he gets rides from friends to treatments, however this is becoming more difficult. Spoke with Judson Roch SW and informed her of our conversation. She was aware of his need for homeless resources and stated she will see him later after dialysis. Patient stated no other dialysis concerns.

## 2021-11-17 NOTE — Progress Notes (Signed)
Pre HD RN assessment 

## 2021-11-17 NOTE — Progress Notes (Addendum)
[facility]  ROUNDING NOTE   Subjective:   Anthony Hess is a 66 year old male with past medical conditions including CAD, bladder cancer, hyperlipidemia, hypertension, PVD, and end-stage renal disease on hemodialysis.  Patient presents to the emergency department with left flank pain and has been admitted for Hypertensive urgency [I16.0]   Patient is known to our practice and receives outpatient dialysis treatments at [facility] on a TTS schedule, supervised by [facility] physicians.   Patient seen and evaluated during dialysis   HEMODIALYSIS FLOWSHEET:  Blood Flow Rate (mL/min): 400 mL/min Arterial Pressure (mmHg): -190 mmHg Venous Pressure (mmHg): 200 mmHg TMP (mmHg): 1 mmHg Ultrafiltration Rate (mL/min): 542 mL/min Dialysate Flow Rate (mL/min): 300 ml/min Dialysis Fluid Bolus: Normal Saline Bolus Amount (mL): 300 mL  Reports lower back pain Tolerating treatment  Objective:  Vital signs in last 24 hours:  Temp:  [97.4 F (36.3 C)-99.1 F (37.3 C)] 97.8 F (36.6 C) (09/26 1504) Pulse Rate:  [60-88] 60 (09/26 1504) Resp:  [11-23] 17 (09/26 1504) BP: (153-216)/(101-146) 159/101 (09/26 1504) SpO2:  [94 %-100 %] 96 % (09/26 1504) Weight:  [54.5 kg-56.8 kg] 54.5 kg (09/26 1147)  Weight change:  Filed Weights   11/17/21 0756 11/17/21 1143 11/17/21 1147  Weight: 56.3 kg 54.5 kg 54.5 kg    Intake/Output: I/O last 3 completed shifts: In: -  Out: 1000 [Other:1000]   Intake/Output this shift:  Total I/O In: -  Out: 1000 [Other:1000]  Physical Exam: General: NAD  Head: Normocephalic, atraumatic. Moist oral mucosal membranes  Eyes: Anicteric  Lungs:  Clear to auscultation, normal effort  Heart: Regular rate and rhythm  Abdomen:  Soft, nontender, nondistended  Extremities:  No  peripheral edema.  Neurologic: Nonfocal, moving all four extremities  Skin: No lesions  Access: Rt permcath    Basic Metabolic Panel: Recent Labs  Lab 11/16/21 1052  11/17/21 0548 11/17/21 0551  NA 143  --  140  K 5.8*  --  3.7  CL 107  --  105  CO2 20*  --  25  GLUCOSE 97  --  93  BUN 86*  --  47*  CREATININE 8.21*  --  5.32*  CALCIUM 7.8*  --  6.9*  MG 2.7*  --   --   PHOS  --  7.4*  --     Liver Function Tests: Recent Labs  Lab 11/16/21 1052  AST 26  ALT 37  ALKPHOS 60  BILITOT 0.7  PROT 7.9  ALBUMIN 4.2   No results for input(s): "LIPASE", "AMYLASE" in the last 168 hours. No results for input(s): "AMMONIA" in the last 168 hours.  CBC: Recent Labs  Lab 11/16/21 1052 11/17/21 0551  WBC 10.5 7.5  NEUTROABS 7.8*  --   HGB 12.0* 10.6*  HCT 39.9 33.3*  MCV 94.1 90.0  PLT 308 243    Cardiac Enzymes: No results for input(s): "CKTOTAL", "CKMB", "CKMBINDEX", "TROPONINI" in the last 168 hours.  BNP: Invalid input(s): "POCBNP"  CBG: No results for input(s): "GLUCAP" in the last 168 hours.  Microbiology: Results for orders placed or performed during the hospital encounter of 11/16/21  SARS Coronavirus 2 by RT PCR (hospital order, performed in [facility] hospital lab) *cepheid single result test* Anterior Nasal Swab     Status: None   Collection Time: 11/16/21 11:35 AM   Specimen: Anterior Nasal Swab  Result Value Ref Range Status   SARS Coronavirus 2 by RT PCR NEGATIVE NEGATIVE Final    Comment: (NOTE)  SARS-CoV-2 target nucleic acids are NOT DETECTED.  The SARS-CoV-2 RNA is generally detectable in upper and lower respiratory specimens during the acute phase of infection. The lowest concentration of SARS-CoV-2 viral copies this assay can detect is 250 copies / mL. A negative result does not preclude SARS-CoV-2 infection and should not be used as the sole basis for treatment or other patient management decisions.  A negative result may occur with improper specimen collection / handling, submission of specimen other than nasopharyngeal swab, presence of viral mutation(s) within the areas targeted by this assay, and  inadequate number of viral copies (<250 copies / mL). A negative result must be combined with clinical observations, patient history, and epidemiological information.  Fact Sheet for Patients:   https://www.patel.info/  Fact Sheet for Healthcare Providers: https://hall.com/  This test is not yet approved or  cleared by the [place] FDA and has been authorized for detection and/or diagnosis of SARS-CoV-2 by FDA under an Emergency Use Authorization (EUA).  This EUA will remain in effect (meaning this test can be used) for the duration of the COVID-19 declaration under Section 564(b)(1) of the Act, 21 U.S.C. section 360bbb-3(b)(1), unless the authorization is terminated or revoked sooner.  Performed at [facility], [address] [place], [place] [address]     Coagulation Studies: Recent Labs    11/16/21 1052  LABPROT 13.0  INR 1.0    Urinalysis: Recent Labs    11/16/21 1135  COLORURINE STRAW*  LABSPEC 1.009  PHURINE 7.0  GLUCOSEU 150*  HGBUR NEGATIVE  BILIRUBINUR NEGATIVE  KETONESUR NEGATIVE  PROTEINUR 100*  NITRITE NEGATIVE  LEUKOCYTESUR NEGATIVE      Imaging: CT Angio Chest/Abd/Pel for Dissection W and/or Wo Contrast  Result Date: 11/16/2021 CLINICAL DATA:  Acute aortic syndrome suspected. EXAM: CT ANGIOGRAPHY CHEST, ABDOMEN AND PELVIS TECHNIQUE: Non-contrast CT of the chest was initially obtained. Multidetector CT imaging through the chest, abdomen and pelvis was performed using the standard protocol during bolus administration of intravenous contrast. Multiplanar reconstructed images and MIPs were obtained and reviewed to evaluate the vascular anatomy. RADIATION DOSE REDUCTION: This exam was performed according to the departmental dose-optimization program which includes automated exposure control, adjustment of the mA and/or kV according to patient size and/or use of iterative reconstruction technique.  CONTRAST:  81m OMNIPAQUE IOHEXOL 350 MG/ML SOLN COMPARISON:  None Available. FINDINGS: CTA CHEST FINDINGS Cardiovascular: Preferential opacification of the thoracic aorta. No evidence of thoracic aortic aneurysm or dissection. Normal heart size. No pericardial effusion. Mediastinum/Nodes: No enlarged mediastinal, hilar, or axillary lymph nodes. Thyroid gland, trachea, and esophagus demonstrate no significant findings. Lungs/Pleura: Emphysematous changes of the lungs. No focal consolidation or pleural effusion. Musculoskeletal: Sternotomy wires were noted. No acute osseous abnormality. Review of the MIP images confirms the above findings. CTA ABDOMEN AND PELVIS FINDINGS VASCULAR Aorta: Moderate atherosclerotic calcification of abdominal aorta. Focal dilatation of the infrarenal abdominal aorta measuring 2.5 x 2.9 cm. Celiac: Patent without evidence of aneurysm, dissection, vasculitis or significant stenosis. SMA: Patent without evidence of aneurysm, dissection, vasculitis or significant stenosis. Renals: Both renal arteries are patent without evidence of aneurysm, dissection, vasculitis, fibromuscular dysplasia or significant stenosis. IMA: Patent without evidence of aneurysm, dissection, vasculitis or significant stenosis. Inflow: Patent without evidence of aneurysm, dissection, vasculitis or significant stenosis. Veins: No obvious venous abnormality within the limitations of this arterial phase study. Review of the MIP images confirms the above findings. NON-VASCULAR Hepatobiliary: Multiple hepatic lesions with peripheral discontinuous enhancement suggesting hemangiomas. No gallstones, gallbladder wall  thickening, or biliary dilatation. Pancreas: Unremarkable. No pancreatic ductal dilatation or surrounding inflammatory changes. Spleen: Normal in size without focal abnormality. Adrenals/Urinary Tract: Symmetric perfusion of bilateral kidneys. Mild bilateral renal cortical atrophy. Right parapelvic renal cysts. No  evidence of hydronephrosis. Thickening of the urinary bladder wall. Stomach/Bowel: Stomach is within normal limits. Appendix appears normal. No evidence of bowel wall thickening, distention, or inflammatory changes. Lymphatic: No lymphadenopathy. Reproductive: Prostate is unremarkable. Other: No abdominal wall hernia or abnormality. No abdominopelvic ascites. Musculoskeletal: Advanced levoscoliosis centered at L1 vertebral body. Mild bilateral hip osteoarthritis. No acute fracture or dislocation. Review of the MIP images confirms the above findings. IMPRESSION: 1.  No evidence of aortic dissection or acute vascular abnormality. 2. Emphysematous changes of the lungs without evidence of pneumonia or pulmonary edema. 3. Moderate atherosclerotic disease of abdominal aorta and branch vessels. Mildly dilated infrarenal abdominal aorta measuring 2.5 x 2.9 cm. Follow-up examination five years is recommended. 5. Thickening of the urinary bladder wall suggesting cystitis, correlate with urinalysis. 6. Moderate levoscoliosis of the lumbar spine centered at L1 vertebral body. Degenerate disc disease of the lumbar spine. Bilateral hip osteoarthritis. 6.  Additional chronic findings as above. Electronically Signed   By: Keane Police D.O.   On: 11/16/2021 13:12   CT Renal Stone Study  Result Date: 11/16/2021 CLINICAL DATA:  Lower back pain and abdominal pain. EXAM: CT ABDOMEN AND PELVIS WITHOUT CONTRAST TECHNIQUE: Multidetector CT imaging of the abdomen and pelvis was performed following the standard protocol without IV contrast. RADIATION DOSE REDUCTION: This exam was performed according to the departmental dose-optimization program which includes automated exposure control, adjustment of the mA and/or kV according to patient size and/or use of iterative reconstruction technique. COMPARISON:  CT abdomen/pelvis 04/08/2018 FINDINGS: Lower chest: The lung bases are clear. The imaged heart is unremarkable. Hepatobiliary:  Hypodense lesions in the liver are again seen consistent with hemangiomas, unchanged. There are small layering gallstones in the gallbladder. There is no gallbladder wall thickening. There is no biliary ductal dilatation. Pancreas: Grossly unremarkable. Spleen: Unremarkable. Adrenals/Urinary Tract: The adrenals are unremarkable. Right renal atrophy and cortical thinning is unchanged. Chronic right hydronephrosis and proximal hydroureter is unchanged. The distal right ureter is not definitely seen. No obstructing lesion or stone is seen. Fullness of the left renal pelvis is unchanged without frank hydronephrosis or hydroureter. No obstructing stone is seen on the left. There is mild diffuse bladder wall thickening. Stomach/Bowel: There is thickening of the stomach wall as well as thickening of the proximal small bowel wall, overall similar to the prior study from 04/08/2021. There is no convincing evidence of large bowel wall thickening or inflammation. There is no evidence of mechanical obstruction. The appendix is not definitively identified. Vascular/Lymphatic: There is extensive calcified atherosclerotic plaque throughout the mildly aneurysmal abdominal aorta measuring up to 3.0 cm. There is no abdominal or pelvic lymphadenopathy. Reproductive: The prostate and seminal vesicles are unremarkable. Other: There is small volume ascites in the abdomen. There is no definite organized or drainable fluid collection. There is no free intraperitoneal air. Musculoskeletal: There is no acute osseous abnormality or suspicious osseous lesion. Levoscoliosis is again seen. There has been prior instrumentation of the proximal right femur. IMPRESSION: 1. Thickening of the stomach wall and proximal small bowel wall may reflect gastroenteritis in the correct clinical setting but could also reflect a component of fluid overload. 2. Small volume ascites. 3. Unchanged right renal atrophy and chronic hydronephrosis and proximal  hydroureter. No obstructing lesion or stone is  seen along the course of either ureter. 4. Small gallstones without evidence of acute cholecystitis. 5. Mildly aneurysmal abdominal aorta measuring up to 3.0 cm. Recommend follow-up ultrasound every 3 years. Electronically Signed   By: Valetta Mole M.D.   On: 11/16/2021 11:23     Medications:    anticoagulant sodium citrate      amLODipine  10 mg Oral Daily   aspirin EC  81 mg Oral Daily   atorvastatin  40 mg Oral BH-q7a   calcitRIOL  0.5 mcg Oral Daily   calcium acetate  667 mg Oral TID WC   Chlorhexidine Gluconate Cloth  6 each Topical Q0600   feeding supplement (NEPRO CARB STEADY)  237 mL Oral TID BM   heparin  5,000 Units Subcutaneous Q8H   multivitamin  1 tablet Oral QHS   sucroferric oxyhydroxide  500 mg Oral TID WC   zinc sulfate  220 mg Oral Daily   acetaminophen **OR** acetaminophen, alteplase, anticoagulant sodium citrate, heparin, hydrALAZINE, lidocaine (PF), lidocaine-prilocaine, ondansetron **OR** ondansetron (ZOFRAN) IV, oxyCODONE-acetaminophen, pentafluoroprop-tetrafluoroeth, senna-docusate  Assessment/ Plan:  Anthony Hess is a 66 y.o.  male with past medical conditions including CAD, bladder cancer, hyperlipidemia, hypertension, PVD, and end-stage renal disease on hemodialysis.  Patient presents to the emergency department with left flank pain and has been admitted for Hypertensive urgency [I16.0]  [facility]/TTS/ Rt permcath  Hyperkalemia with end stage renal disease on hemodialysis. Potassium 5.8. Received urgent dialysis yesterday to correct potassium. Potassium 3.7 today. Receiving dialysis today to maintain outpatient schedule. Next treatment scheduled for Thursday.  2. Anemia of chronic kidney disease Lab Results  Component Value Date   HGB 10.6 (L) 11/17/2021    Hgb within desired target  3. Secondary Hyperparathyroidism: Lab Results  Component Value Date   PTH 159 (H) 12/27/2019   CALCIUM 6.9 (L)  11/17/2021   CAION 0.84 (LL) 09/11/2021   PHOS 7.4 (H) 11/17/2021    Calcium remains decreased, outpatient medications restarted.Phosphorus remains elevated as well.  Daily calcitriol and calcium acetate with meals.    4  Hypertension with chronic kidney disease.  Home regimen includes amlodipine and metoprolol.  Patient states he was out of current prescription.   Improved blood pressure control today. 170/110    LOS: 0 Eileene Kisling 9/26/20233:11 PM

## 2021-11-18 ENCOUNTER — Other Ambulatory Visit: Payer: Self-pay

## 2021-11-18 ENCOUNTER — Emergency Department
Admission: EM | Admit: 2021-11-18 | Discharge: 2021-11-19 | Disposition: A | Discharge status: Home / Self Care | Payer: Medicare Other | Attending: Emergency Medicine | Admitting: Emergency Medicine

## 2021-11-18 DIAGNOSIS — Z992 Dependence on renal dialysis: Secondary | ICD-10-CM | POA: Insufficient documentation

## 2021-11-18 DIAGNOSIS — R109 Unspecified abdominal pain: Secondary | ICD-10-CM | POA: Diagnosis present

## 2021-11-18 DIAGNOSIS — R748 Abnormal levels of other serum enzymes: Secondary | ICD-10-CM | POA: Diagnosis not present

## 2021-11-18 DIAGNOSIS — I120 Hypertensive chronic kidney disease with stage 5 chronic kidney disease or end stage renal disease: Secondary | ICD-10-CM | POA: Insufficient documentation

## 2021-11-18 DIAGNOSIS — I160 Hypertensive urgency: Secondary | ICD-10-CM | POA: Diagnosis not present

## 2021-11-18 DIAGNOSIS — R1084 Generalized abdominal pain: Secondary | ICD-10-CM | POA: Diagnosis not present

## 2021-11-18 DIAGNOSIS — N186 End stage renal disease: Secondary | ICD-10-CM | POA: Diagnosis not present

## 2021-11-18 LAB — CBC
HCT: 37.8 % — ABNORMAL LOW (ref 39.0–52.0)
Hemoglobin: 11.6 g/dL — ABNORMAL LOW (ref 13.0–17.0)
MCH: 28.8 pg (ref 26.0–34.0)
MCHC: 30.7 g/dL (ref 30.0–36.0)
MCV: 93.8 fL (ref 80.0–100.0)
Platelets: 295 10*3/uL (ref 150–400)
RBC: 4.03 MIL/uL — ABNORMAL LOW (ref 4.22–5.81)
RDW: 16.5 % — ABNORMAL HIGH (ref 11.5–15.5)
WBC: 11.3 10*3/uL — ABNORMAL HIGH (ref 4.0–10.5)
nRBC: 0 % (ref 0.0–0.2)

## 2021-11-18 LAB — BASIC METABOLIC PANEL
Anion gap: 12 (ref 5–15)
BUN: 31 mg/dL — ABNORMAL HIGH (ref 8–23)
CO2: 26 mmol/L (ref 22–32)
Calcium: 8.1 mg/dL — ABNORMAL LOW (ref 8.9–10.3)
Chloride: 101 mmol/L (ref 98–111)
Creatinine, Ser: 4.49 mg/dL — ABNORMAL HIGH (ref 0.61–1.24)
GFR, Estimated: 14 mL/min — ABNORMAL LOW (ref >60)
Glucose, Bld: 97 mg/dL (ref 70–99)
Potassium: 3.9 mmol/L (ref 3.5–5.1)
Sodium: 139 mmol/L (ref 135–145)

## 2021-11-18 LAB — MRSA NEXT GEN BY PCR, NASAL: MRSA by PCR Next Gen: DETECTED — AB

## 2021-11-18 MED ORDER — VELPHORO 500 MG PO CHEW: 500.0000 mg | CHEWABLE_TABLET | Freq: Three times a day (TID) | ORAL | 0 refills | Status: DC

## 2021-11-18 MED ORDER — CARVEDILOL 12.5 MG PO TABS
12.5000 mg | ORAL_TABLET | Freq: Two times a day (BID) | ORAL | Status: DC
  Administered 2021-11-18: 12.5 mg via ORAL
  Filled 2021-11-18: qty 1

## 2021-11-18 MED ORDER — CALCITRIOL 0.5 MCG PO CAPS: 0.5000 ug | ORAL_CAPSULE | Freq: Every day | ORAL | 0 refills | Status: DC

## 2021-11-18 MED ORDER — AMLODIPINE BESYLATE 10 MG PO TABS: 10.0000 mg | ORAL_TABLET | Freq: Every day | ORAL | 0 refills | Status: DC

## 2021-11-18 MED ORDER — HYDRALAZINE HCL 50 MG PO TABS: 25.0000 mg | ORAL_TABLET | Freq: Three times a day (TID) | ORAL | 0 refills | Status: DC

## 2021-11-18 MED ORDER — HYDRALAZINE HCL 50 MG PO TABS
25.0000 mg | ORAL_TABLET | Freq: Once | ORAL | Status: AC
  Administered 2021-11-19: 25 mg via ORAL
  Filled 2021-11-18: qty 1

## 2021-11-18 MED ORDER — ASPIRIN 81 MG PO TBEC: 81.0000 mg | DELAYED_RELEASE_TABLET | Freq: Every day | ORAL | 0 refills | Status: DC

## 2021-11-18 MED ORDER — CALCIUM ACETATE (PHOS BINDER) 667 MG PO CAPS: 667.0000 mg | ORAL_CAPSULE | Freq: Three times a day (TID) | ORAL | 0 refills | Status: DC

## 2021-11-18 MED ORDER — ACETAMINOPHEN 500 MG PO TABS: 1000.0000 mg | ORAL_TABLET | Freq: Four times a day (QID) | ORAL | 0 refills | Status: DC | PRN

## 2021-11-18 MED ORDER — ATORVASTATIN CALCIUM 80 MG PO TABS: 40.0000 mg | ORAL_TABLET | Freq: Every day | ORAL | 0 refills | Status: DC

## 2021-11-18 MED ORDER — CARVEDILOL 25 MG PO TABS: 12.5000 mg | ORAL_TABLET | Freq: Two times a day (BID) | ORAL | 0 refills | Status: DC

## 2021-11-18 MED ORDER — CARVEDILOL 6.25 MG PO TABS
6.2500 mg | ORAL_TABLET | Freq: Once | ORAL | Status: AC
  Administered 2021-11-18: 6.25 mg via ORAL
  Filled 2021-11-18: qty 1

## 2021-11-18 MED ORDER — VITAMIN D (ERGOCALCIFEROL) 1.25 MG (50000 UNIT) PO CAPS: 50000.0000 [IU] | ORAL_CAPSULE | ORAL | 0 refills | Status: DC

## 2021-11-18 MED ORDER — CARVEDILOL 6.25 MG PO TABS
12.5000 mg | ORAL_TABLET | Freq: Once | ORAL | Status: AC
  Administered 2021-11-19: 12.5 mg via ORAL
  Filled 2021-11-18: qty 2

## 2021-11-18 MED ORDER — ZINC SULFATE 220 (50 ZN) MG PO CAPS: 220.0000 mg | ORAL_CAPSULE | Freq: Every day | ORAL | 0 refills | Status: DC

## 2021-11-18 MED ORDER — SENNOSIDES-DOCUSATE SODIUM 8.6-50 MG PO TABS: 2.0000 | ORAL_TABLET | Freq: Every evening | ORAL | Status: DC | PRN

## 2021-11-18 NOTE — ED Triage Notes (Signed)
Pt presents to ED c/o RLQ abdominal pain. Pt states he was released from the hospital today after being admitted for 3 days for lighteheadness, high bp and does not feel better. Pt having nausea and vomiting. Pt is dialysis pt (T, TH, Sat).

## 2021-11-18 NOTE — TOC Transition Note (Signed)
Transition of Care [facility]) - CM/SW Discharge Note   Patient Details  Name: Ida Uppal MRN: [id number] Date of Birth: [birth year]  Transition of [facility]) CM/SW Contact:  Candie Chroman, LCSW Phone Number: 11/18/2021, 3:41 PM   Clinical Narrative:  Patient has orders to discharge today. He does not have anyone to pick him up and no where specific to go. Patient said for RN to roll him to front of hospital and he will decide where to go from there. He tried calling shelter but no answer. No further concerns. CSW signing off.   Final next level of care: Other (comment) (Homeless) Barriers to Discharge: No Barriers Identified   Patient Goals and CMS Choice        Discharge Placement                Patient to be transferred to facility by: Walk   Patient and family notified of of transfer: 11/18/21  Discharge Plan and Services     Post Acute Care Choice: NA                               Social Determinants of Health (SDOH) Interventions     Readmission Risk Interventions    11/18/2021    2:04 PM 04/09/2021    1:32 PM 03/16/2021    2:18 PM  Readmission Risk Prevention Plan  Transportation Screening Complete Complete Complete  Social Work Consult for [place] Planning/Counseling   Complete  Palliative Care Screening  Not Applicable Not Applicable  Medication Review Press photographer) Complete Complete Complete  PCP or Specialist appointment within 3-5 days of discharge Complete    SW Recovery Care/Counseling Consult Complete    [place] Not Applicable

## 2021-11-18 NOTE — ED Provider Notes (Signed)
[facility] Provider Note    Event Date/Time   First MD Initiated Contact with Patient 11/18/21 2257     (approximate)   History   Abdominal Pain   HPI  Anthony Hess is a 66 y.o. male   Past medical history of end-stage renal disease on hemodialysis Tuesday Thursday Saturday, hypertension, hyperlipidemia with recent hospitalization discharged earlier this afternoon hypertensive urgency and left flank pain work-up included CTA chest abdomen and pelvis which was negative for acute pathology returns to the emergency department today for ongoing flank pain, unchanged in quality or severity, and housing instability he states he was recently kicked out of his house by his wife and could not get in touch with the homeless shelter yesterday after discharge.  His flank pain is unchanged, continues to hurt since about 10 days ago when it started atraumatically, and denies chest pain, shortness of breath, fever, or any changes in his symptoms since his presentation during last hospitalization  Has had mild dysuria (he still makes urine) unchanged for nearly 1 week.  Was unable to take his antihypertensives nighttime doses tonight.  History was obtained via patient and review of external medical notes including discharge summary dated 11/18/2021      Physical Exam   Triage Vital Signs: ED Triage Vitals  Enc Vitals Group     BP 11/18/21 2237 (!) 212/133     Pulse Rate 11/18/21 2237 88     Resp 11/18/21 2237 18     Temp 11/18/21 2237 98.2 F (36.8 C)     Temp Source 11/18/21 2237 Oral     SpO2 11/18/21 2237 96 %     Weight --      Height --      Head Circumference --      Peak Flow --      Pain Score 11/18/21 2238 7     Pain Loc --      Pain Edu? --      Excl. in [place]? --     Most recent vital signs: Vitals:   11/19/21 0215 11/19/21 0345  BP: 108/71 117/81  Pulse: 62 (!) 57  Resp: 16 16  Temp:    SpO2: 95% 97%    General: Awake, no distress.   Alert and oriented, pleasant and cooperative CV:  Good peripheral perfusion.  Resp:  Normal effort.  Clear to auscultation Abd:  No distention.  Mild tenderness left-sided Other:  Hypertensive 200s over 130s, afebrile   ED Results / Procedures / Treatments   Labs (all labs ordered are listed, but only abnormal results are displayed) Labs Reviewed  LIPASE, BLOOD - Abnormal; Notable for the following components:      Result Value   Lipase 110 (*)    All other components within normal limits  COMPREHENSIVE METABOLIC PANEL - Abnormal; Notable for the following components:   Chloride 97 (*)    BUN 56 (*)    Creatinine, Ser 6.63 (*)    Calcium 7.5 (*)    GFR, Estimated 9 (*)    All other components within normal limits  CBC - Abnormal; Notable for the following components:   WBC 11.3 (*)    RBC 4.03 (*)    Hemoglobin 11.6 (*)    HCT 37.8 (*)    RDW 16.5 (*)    All other components within normal limits  TROPONIN I (HIGH SENSITIVITY) - Abnormal; Notable for the following components:   Troponin I (High Sensitivity) 21 (*)  All other components within normal limits  TROPONIN I (HIGH SENSITIVITY) - Abnormal; Notable for the following components:   Troponin I (High Sensitivity) 19 (*)    All other components within normal limits  URINALYSIS, ROUTINE W REFLEX MICROSCOPIC     I reviewed labs and they are notable for trop 21 initial, similar to prior obtained yesterday. Lipase mildly elevated 100  EKG  ED ECG REPORT I, Lucillie Garfinkel, the attending physician, personally viewed and interpreted this ECG.   Date: 11/18/2021  EKG Time: 1025  Rate: 69  Rhythm: unchanged from previous tracings  Axis: normal  Intervals:none  ST&T Change: no ischemic changes     PROCEDURES:  Critical Care performed: No  Procedures   MEDICATIONS ORDERED IN ED: Medications  carvedilol (COREG) tablet 12.5 mg (12.5 mg Oral Given 11/19/21 0046)  hydrALAZINE (APRESOLINE) tablet 25 mg (25 mg Oral  Given 11/19/21 0046)    IMPRESSION / MDM / ASSESSMENT AND PLAN / ED COURSE  I reviewed the triage vital signs and the nursing notes.                              Differential diagnosis includes, but is not limited to, hypertensive urgency, hypertensive emergency, intra-abdominal infection, vascular, renal stone, urinary tract infection   The patient is on the cardiac monitor to evaluate for evidence of arrhythmia and/or significant heart rate changes.  MDM: This patient with ongoing symptoms unchanged from last hospitalization chiefly concern for ongoing outpatient pain management and social issues including homelessness unable to get into the shelter yesterday.  Work-up largely unremarkable, mildly elevated lipase but no epigastric tenderness and no LFT abnormalities, pain is unchanged in quality or severity since 48 hours ago when he had a CT scan which was negative for acute abdominal pathology.  Patient feels better without interventions in the emergency department, blood pressure normalized with his nighttime medications.  We were pending a urinalysis to check for urine infection, though his urinalysis was normal 2 days ago, patient unable to produce urine and declines catheterization at this time.  Spoke with him again about further evaluation or admission to the hospital for pain control, CT scans, but patient elects to be discharged at this time to be able to continue working on finding access to his homeless shelter.  I emphasized that he should return to the emergency department should he have any worsening of symptoms or changes.    Patient's presentation is most consistent with acute presentation with potential threat to life or bodily function.       FINAL CLINICAL IMPRESSION(S) / ED DIAGNOSES   Final diagnoses:  Generalized abdominal pain     Rx / DC Orders   ED Discharge Orders     None        Note:  This document was prepared using Dragon voice recognition  software and may include unintentional dictation errors.    Lucillie Garfinkel, MD 11/19/21 [phone]

## 2021-11-18 NOTE — Progress Notes (Signed)
[facility]  ROUNDING NOTE   Subjective:   Anthony Hess is a 66 year old male with past medical conditions including CAD, bladder cancer, hyperlipidemia, hypertension, PVD, and end-stage renal disease on hemodialysis.  Patient presents to the emergency department with left flank pain and has been admitted for Hypertensive urgency [I16.0]   Patient is known to our practice and receives outpatient dialysis treatments at [facility] on a TTS schedule, supervised by [facility] physicians.   Update: Patient resting comfortably Alert and oriented Dialysis yesterday, UF 1L, tolerated well Voices concerns about discharge, states he is homeless and unsure of transportation to dialysis and medication.   Objective:  Vital signs in last 24 hours:  Temp:  [97.8 F (36.6 C)-98.3 F (36.8 C)] 97.9 F (36.6 C) (09/27 1155) Pulse Rate:  [60-74] 69 (09/27 1155) Resp:  [16-20] 20 (09/27 1155) BP: (118-206)/(92-124) 118/92 (09/27 1155) SpO2:  [95 %-100 %] 95 % (09/27 1155)  Weight change: -1.307 kg Filed Weights   11/17/21 0756 11/17/21 1143 11/17/21 1147  Weight: 56.3 kg 54.5 kg 54.5 kg    Intake/Output: I/O last 3 completed shifts: In: -  Out: 1000 [Other:1000]   Intake/Output this shift:  No intake/output data recorded.  Physical Exam: General: NAD  Head: Normocephalic, atraumatic. Moist oral mucosal membranes  Eyes: Anicteric  Lungs:  Clear to auscultation, normal effort  Heart: Regular rate and rhythm  Abdomen:  Soft, nontender, nondistended  Extremities:  No  peripheral edema.  Neurologic: Nonfocal, moving all four extremities  Skin: No lesions  Access: Rt permcath    Basic Metabolic Panel: Recent Labs  Lab 11/16/21 1052 11/17/21 0548 11/17/21 0551 11/18/21 0513  NA 143  --  140 139  K 5.8*  --  3.7 3.9  CL 107  --  105 101  CO2 20*  --  25 26  GLUCOSE 97  --  93 97  BUN 86*  --  47* 31*  CREATININE 8.21*  --  5.32* 4.49*  CALCIUM 7.8*  --  6.9* 8.1*   MG 2.7*  --   --   --   PHOS  --  7.4*  --   --      Liver Function Tests: Recent Labs  Lab 11/16/21 1052  AST 26  ALT 37  ALKPHOS 60  BILITOT 0.7  PROT 7.9  ALBUMIN 4.2    No results for input(s): "LIPASE", "AMYLASE" in the last 168 hours. No results for input(s): "AMMONIA" in the last 168 hours.  CBC: Recent Labs  Lab 11/16/21 1052 11/17/21 0551  WBC 10.5 7.5  NEUTROABS 7.8*  --   HGB 12.0* 10.6*  HCT 39.9 33.3*  MCV 94.1 90.0  PLT 308 243     Cardiac Enzymes: No results for input(s): "CKTOTAL", "CKMB", "CKMBINDEX", "TROPONINI" in the last 168 hours.  BNP: Invalid input(s): "POCBNP"  CBG: No results for input(s): "GLUCAP" in the last 168 hours.  Microbiology: Results for orders placed or performed during the hospital encounter of 11/16/21  SARS Coronavirus 2 by RT PCR (hospital order, performed in [facility] hospital lab) *cepheid single result test* Anterior Nasal Swab     Status: None   Collection Time: 11/16/21 11:35 AM   Specimen: Anterior Nasal Swab  Result Value Ref Range Status   SARS Coronavirus 2 by RT PCR NEGATIVE NEGATIVE Final    Comment: (NOTE) SARS-CoV-2 target nucleic acids are NOT DETECTED.  The SARS-CoV-2 RNA is generally detectable in upper and lower respiratory specimens during the  acute phase of infection. The lowest concentration of SARS-CoV-2 viral copies this assay can detect is 250 copies / mL. A negative result does not preclude SARS-CoV-2 infection and should not be used as the sole basis for treatment or other patient management decisions.  A negative result may occur with improper specimen collection / handling, submission of specimen other than nasopharyngeal swab, presence of viral mutation(s) within the areas targeted by this assay, and inadequate number of viral copies (<250 copies / mL). A negative result must be combined with clinical observations, patient history, and epidemiological information.  Fact Sheet for  Patients:   https://www.patel.info/  Fact Sheet for Healthcare Providers: https://hall.com/  This test is not yet approved or  cleared by the [place] FDA and has been authorized for detection and/or diagnosis of SARS-CoV-2 by FDA under an Emergency Use Authorization (EUA).  This EUA will remain in effect (meaning this test can be used) for the duration of the COVID-19 declaration under Section 564(b)(1) of the Act, 21 U.S.C. section 360bbb-3(b)(1), unless the authorization is terminated or revoked sooner.  Performed at [facility], [address] [place], [place] [address]     Coagulation Studies: Recent Labs    11/16/21 1052  LABPROT 13.0  INR 1.0     Urinalysis: Recent Labs    11/16/21 1135  COLORURINE STRAW*  LABSPEC 1.009  PHURINE 7.0  GLUCOSEU 150*  HGBUR NEGATIVE  BILIRUBINUR NEGATIVE  KETONESUR NEGATIVE  PROTEINUR 100*  NITRITE NEGATIVE  LEUKOCYTESUR NEGATIVE       Imaging: CT Angio Chest/Abd/Pel for Dissection W and/or Wo Contrast  Result Date: 11/16/2021 CLINICAL DATA:  Acute aortic syndrome suspected. EXAM: CT ANGIOGRAPHY CHEST, ABDOMEN AND PELVIS TECHNIQUE: Non-contrast CT of the chest was initially obtained. Multidetector CT imaging through the chest, abdomen and pelvis was performed using the standard protocol during bolus administration of intravenous contrast. Multiplanar reconstructed images and MIPs were obtained and reviewed to evaluate the vascular anatomy. RADIATION DOSE REDUCTION: This exam was performed according to the departmental dose-optimization program which includes automated exposure control, adjustment of the mA and/or kV according to patient size and/or use of iterative reconstruction technique. CONTRAST:  50m OMNIPAQUE IOHEXOL 350 MG/ML SOLN COMPARISON:  None Available. FINDINGS: CTA CHEST FINDINGS Cardiovascular: Preferential opacification of the thoracic aorta. No evidence  of thoracic aortic aneurysm or dissection. Normal heart size. No pericardial effusion. Mediastinum/Nodes: No enlarged mediastinal, hilar, or axillary lymph nodes. Thyroid gland, trachea, and esophagus demonstrate no significant findings. Lungs/Pleura: Emphysematous changes of the lungs. No focal consolidation or pleural effusion. Musculoskeletal: Sternotomy wires were noted. No acute osseous abnormality. Review of the MIP images confirms the above findings. CTA ABDOMEN AND PELVIS FINDINGS VASCULAR Aorta: Moderate atherosclerotic calcification of abdominal aorta. Focal dilatation of the infrarenal abdominal aorta measuring 2.5 x 2.9 cm. Celiac: Patent without evidence of aneurysm, dissection, vasculitis or significant stenosis. SMA: Patent without evidence of aneurysm, dissection, vasculitis or significant stenosis. Renals: Both renal arteries are patent without evidence of aneurysm, dissection, vasculitis, fibromuscular dysplasia or significant stenosis. IMA: Patent without evidence of aneurysm, dissection, vasculitis or significant stenosis. Inflow: Patent without evidence of aneurysm, dissection, vasculitis or significant stenosis. Veins: No obvious venous abnormality within the limitations of this arterial phase study. Review of the MIP images confirms the above findings. NON-VASCULAR Hepatobiliary: Multiple hepatic lesions with peripheral discontinuous enhancement suggesting hemangiomas. No gallstones, gallbladder wall thickening, or biliary dilatation. Pancreas: Unremarkable. No pancreatic ductal dilatation or surrounding inflammatory changes. Spleen: Normal in size without focal  abnormality. Adrenals/Urinary Tract: Symmetric perfusion of bilateral kidneys. Mild bilateral renal cortical atrophy. Right parapelvic renal cysts. No evidence of hydronephrosis. Thickening of the urinary bladder wall. Stomach/Bowel: Stomach is within normal limits. Appendix appears normal. No evidence of bowel wall thickening,  distention, or inflammatory changes. Lymphatic: No lymphadenopathy. Reproductive: Prostate is unremarkable. Other: No abdominal wall hernia or abnormality. No abdominopelvic ascites. Musculoskeletal: Advanced levoscoliosis centered at L1 vertebral body. Mild bilateral hip osteoarthritis. No acute fracture or dislocation. Review of the MIP images confirms the above findings. IMPRESSION: 1.  No evidence of aortic dissection or acute vascular abnormality. 2. Emphysematous changes of the lungs without evidence of pneumonia or pulmonary edema. 3. Moderate atherosclerotic disease of abdominal aorta and branch vessels. Mildly dilated infrarenal abdominal aorta measuring 2.5 x 2.9 cm. Follow-up examination five years is recommended. 5. Thickening of the urinary bladder wall suggesting cystitis, correlate with urinalysis. 6. Moderate levoscoliosis of the lumbar spine centered at L1 vertebral body. Degenerate disc disease of the lumbar spine. Bilateral hip osteoarthritis. 6.  Additional chronic findings as above. Electronically Signed   By: Keane Police D.O.   On: 11/16/2021 13:12     Medications:    anticoagulant sodium citrate      amLODipine  10 mg Oral Daily   aspirin EC  81 mg Oral Daily   atorvastatin  40 mg Oral BH-[id number]   calcitRIOL  0.5 mcg Oral Daily   calcium acetate  667 mg Oral TID WC   carvedilol  12.5 mg Oral BID WC   Chlorhexidine Gluconate Cloth  6 each Topical Q0600   feeding supplement (NEPRO CARB STEADY)  237 mL Oral TID BM   heparin  5,000 Units Subcutaneous Q8H   hydrALAZINE  25 mg Oral Q8H   multivitamin  1 tablet Oral QHS   sucroferric oxyhydroxide  500 mg Oral TID WC   zinc sulfate  220 mg Oral Daily   acetaminophen **OR** acetaminophen, alteplase, anticoagulant sodium citrate, heparin, hydrALAZINE, lidocaine (PF), lidocaine-prilocaine, ondansetron **OR** ondansetron (ZOFRAN) IV, oxyCODONE-acetaminophen, pentafluoroprop-tetrafluoroeth, senna-docusate  Assessment/ Plan:  Mr. Earsel Shouse is a 66 y.o.  male with past medical conditions including CAD, bladder cancer, hyperlipidemia, hypertension, PVD, and end-stage renal disease on hemodialysis.  Patient presents to the emergency department with left flank pain and has been admitted for Hypertensive urgency [I16.0]  [facility] [place]/TTS/ Rt permcath  Hyperkalemia with end stage renal disease on hemodialysis. Potassium corrected with dialysis. Received dialysis yesterday, UF 1L achieved. Next treatment scheduled for Thursday. Patient waiting to speak with case management about discharge plan and medications management.  2. Anemia of chronic kidney disease Lab Results  Component Value Date   HGB 10.6 (L) 11/17/2021    Hgb at goal  3. Secondary Hyperparathyroidism: Lab Results  Component Value Date   PTH 159 (H) 12/27/2019   CALCIUM 8.1 (L) 11/18/2021   CAION 0.84 (LL) 09/11/2021   PHOS 7.4 (H) 11/17/2021    Will continue to monitor bone minerals during this admission.    4  Hypertension with chronic kidney disease.  Home regimen includes amlodipine and metoprolol.  Patient states he was out of current prescription.   Blood pressure 118/92    LOS: 1 Jaquasia Doscher 9/27/202312:07 PM

## 2021-11-18 NOTE — Discharge Summary (Signed)
Physician Discharge Summary   Patient: Anthony Hess MRN: [id number]  DOB: [birth year]   Admit:     Date of Admission: 11/16/2021 Admitted from: home/self-care (pt is unhoused)   Discharge: Date of discharge: 11/18/21 Disposition: Home Condition at discharge: fair  CODE STATUS: FULL CODE     Discharge Physician: Emeterio Reeve, DO [facility]     PCP: Patient, No Pcp Per  Recommendations for Outpatient Follow-up:  Follow up with PCP Patient, No Pcp Per in 1-2 weeks Please obtain labs/tests: BMP Follow w/ dialysis / nephrology  Please follow up on the following pending results: none PCP AND OTHER OUTPATIENT PROVIDERS: SEE BELOW FOR SPECIFIC DISCHARGE INSTRUCTIONS PRINTED FOR PATIENT IN ADDITION TO GENERIC AVS PATIENT INFO    Discharge Instructions     Diet - low sodium heart healthy   Complete by: As directed    Increase activity slowly   Complete by: As directed    No wound care   Complete by: As directed          Discharge Diagnoses: Principal Problem:   Hypertensive urgency Active Problems:   Hypertension   S/P CABG x 4   ESRD (end stage renal disease) ([place])   Anemia in ESRD (end-stage renal disease) ([place])   Hyperlipidemia   Hyperparathyroidism due to renal insufficiency ([place])   Presence of aortocoronary bypass graft   AAA (abdominal aortic aneurysm) ([place])   Hyperkalemia   Chronic back pain       Hospital Course: Anthony Hess is a 66 year old male with end-stage renal disease on hemodialysis, hypertension, hyperlipidemia, who presents emergency department for chief concerns of left flank pain. Out of home BP meds.  09/25: Initial vitals in the emergency department showed temperature 98.9, respiration rate of 18, heart rate 67, blood pressure 204/119, SPO2 of 100% on room air. Serum sodium is 143, potassium 5.8, chloride 107, bicarb 20, BUN of 97, serum creatinine of 8.21, nonfasting blood glucose 97, WBC 10.5, hemoglobin 12,  platelets of 308. High sensitivity troponin is 19.  Lactic acid is 1.1. UA was negative for leukocytes and nitrates.  COVID by PCR was negative.  Magnesium was elevated at 2.7. ED treatment: Fentanyl 50 mcg, ondansetron 4 mg IV, pantoprazole 40 mg IV one-time dose. Admitted for hyperkalemia and hypertensive urgency.      Consultants:  Nephrology  Procedures: Emergency dialysis to treat hyperkalemia, then routine dialysis       ASSESSMENT & PLAN:   Principal Problem:   Hypertensive urgency Active Problems:   Hypertension   S/P CABG x 4   ESRD (end stage renal disease) ([place])   Anemia in ESRD (end-stage renal disease) ([place])   Hyperlipidemia   Hyperparathyroidism due to renal insufficiency ([facility])   Presence of aortocoronary bypass graft   AAA (abdominal aortic aneurysm) ([facility])   Hyperkalemia   Chronic back pain   Hypertension Hypertensive urgency Resume home meds Refills sent  See med rec for full list   Hyperlipidemia Atorvastatin 40 mg daily resumed  ESRD (end stage renal disease) ([place]) Continue Dialysis   Hyperkalemia - corrected w/ dialysis Resolved  Chronic back pain Pain is unchanged from baseline Home medications: Oxycodone-acetaminophen 5-325 mg p.o., 1 to 2 tablets every 6 hours as needed for moderate and severe pain resumed           Discharge Instructions  Allergies as of 11/18/2021   No Known Allergies      Medication List     STOP taking these  medications    metoprolol succinate 50 MG 24 hr tablet Commonly known as: TOPROL-XL   oxyCODONE-acetaminophen 5-325 MG tablet Commonly known as: Percocet   sacubitril-valsartan 24-26 MG Commonly known as: ENTRESTO       TAKE these medications    acetaminophen 500 MG tablet Commonly known as: TYLENOL Take 2 tablets (1,000 mg total) by mouth every 6 (six) hours as needed for mild pain, fever or headache (or Fever >/= 101).   amLODipine 10 MG tablet Commonly known as: NORVASC Take 1  tablet (10 mg total) by mouth daily.   aspirin EC 81 MG tablet Take 1 tablet (81 mg total) by mouth daily. Swallow whole. Start taking on: November 19, 2021 What changed: additional instructions   atorvastatin 80 MG tablet Commonly known as: LIPITOR Take 0.5 tablets (40 mg total) by mouth daily. What changed: medication strength   calcitRIOL 0.5 MCG capsule Commonly known as: ROCALTROL Take 1 capsule (0.5 mcg total) by mouth daily.   calcium acetate 667 MG capsule Commonly known as: PHOSLO Take 1 capsule (667 mg total) by mouth 3 (three) times daily with meals. What changed: how much to take   carvedilol 25 MG tablet Commonly known as: COREG Take 0.5 tablets (12.5 mg total) by mouth 2 (two) times daily with a meal.   feeding supplement (NEPRO CARB STEADY) Liqd Take 237 mLs by mouth 3 (three) times daily between meals.   hydrALAZINE 50 MG tablet Commonly known as: APRESOLINE Take 0.5 tablets (25 mg total) by mouth 3 (three) times daily.   multivitamin Tabs tablet Take 1 tablet by mouth at bedtime.   senna-docusate 8.6-50 MG tablet Commonly known as: Senokot-S Take 2 tablets by mouth at bedtime as needed for mild constipation or moderate constipation.   Velphoro 500 MG chewable tablet Generic drug: sucroferric oxyhydroxide Chew 1 tablet (500 mg total) by mouth 3 (three) times daily with meals.   Vitamin D (Ergocalciferol) 1.25 MG (50000 UNIT) Caps capsule Commonly known as: DRISDOL Take 1 capsule (50,000 Units total) by mouth every 7 (seven) days.   zinc sulfate 220 (50 Zn) MG capsule Take 1 capsule (220 mg total) by mouth daily.          No Known Allergies   Subjective: Reports chronic back pain is about the same, no other concerns today    Discharge Exam: BP 110/89   Pulse 69   Temp 97.9 F (36.6 C)   Resp 20   Ht '5\' 3"'$  (1.6 m)   Wt 54.5 kg   SpO2 95%   BMI 21.28 kg/m  General: Pt is alert, awake, not in acute distress. Thin/frail.   Cardiovascular: RRR, S1/S2 +, no rubs, no gallops Respiratory: CTA bilaterally, no wheezing, no rhonchi Abdominal: Soft, NT, ND, bowel sounds + Extremities: no edema, no cyanosis     The results of significant diagnostics from this hospitalization (including imaging, microbiology, ancillary and laboratory) are listed below for reference.     Microbiology: Recent Results (from the past 240 hour(s))  SARS Coronavirus 2 by RT PCR (hospital order, performed in [facility] hospital lab) *cepheid single result test* Anterior Nasal Swab     Status: None   Collection Time: 11/16/21 11:35 AM   Specimen: Anterior Nasal Swab  Result Value Ref Range Status   SARS Coronavirus 2 by RT PCR NEGATIVE NEGATIVE Final    Comment: (NOTE) SARS-CoV-2 target nucleic acids are NOT DETECTED.  The SARS-CoV-2 RNA is generally detectable in upper and lower respiratory specimens  during the acute phase of infection. The lowest concentration of SARS-CoV-2 viral copies this assay can detect is 250 copies / mL. A negative result does not preclude SARS-CoV-2 infection and should not be used as the sole basis for treatment or other patient management decisions.  A negative result may occur with improper specimen collection / handling, submission of specimen other than nasopharyngeal swab, presence of viral mutation(s) within the areas targeted by this assay, and inadequate number of viral copies (<250 copies / mL). A negative result must be combined with clinical observations, patient history, and epidemiological information.  Fact Sheet for Patients:   https://www.patel.info/  Fact Sheet for Healthcare Providers: https://hall.com/  This test is not yet approved or  cleared by the [place] FDA and has been authorized for detection and/or diagnosis of SARS-CoV-2 by FDA under an Emergency Use Authorization (EUA).  This EUA will remain in effect (meaning this test  can be used) for the duration of the COVID-19 declaration under Section 564(b)(1) of the Act, 21 U.S.C. section 360bbb-3(b)(1), unless the authorization is terminated or revoked sooner.  Performed at [facility], [address] [place], [place] [address]   MRSA Next Gen by PCR, Nasal     Status: Abnormal   Collection Time: 11/18/21  9:54 AM   Specimen: Nasal Mucosa; Nasal Swab  Result Value Ref Range Status   MRSA by PCR Next Gen DETECTED (A) NOT DETECTED Final    Comment: RESULT CALLED TO, READ BACK BY AND VERIFIED WITH: MIA GARNER 11/18/21 1215 MW (NOTE) The GeneXpert MRSA Assay (FDA approved for NASAL specimens only), is one component of a comprehensive MRSA colonization surveillance program. It is not intended to diagnose MRSA infection nor to guide or monitor treatment for MRSA infections. Test performance is not FDA approved in patients less than 30 years old. Performed at [facility], [address] [place], [place] [address]      Labs: BNP (last 3 results) No results for input(s): "BNP" in the last 8760 hours. Basic Metabolic Panel: Recent Labs  Lab 11/16/21 1052 11/17/21 0548 11/17/21 0551 11/18/21 0513  NA 143  --  140 139  K 5.8*  --  3.7 3.9  CL 107  --  105 101  CO2 20*  --  25 26  GLUCOSE 97  --  93 97  BUN 86*  --  47* 31*  CREATININE 8.21*  --  5.32* 4.49*  CALCIUM 7.8*  --  6.9* 8.1*  MG 2.7*  --   --   --   PHOS  --  7.4*  --   --    Liver Function Tests: Recent Labs  Lab 11/16/21 1052  AST 26  ALT 37  ALKPHOS 60  BILITOT 0.7  PROT 7.9  ALBUMIN 4.2   No results for input(s): "LIPASE", "AMYLASE" in the last 168 hours. No results for input(s): "AMMONIA" in the last 168 hours. CBC: Recent Labs  Lab 11/16/21 1052 11/17/21 0551  WBC 10.5 7.5  NEUTROABS 7.8*  --   HGB 12.0* 10.6*  HCT 39.9 33.3*  MCV 94.1 90.0  PLT 308 243   Cardiac Enzymes: No results for input(s): "CKTOTAL", "CKMB", "CKMBINDEX", "TROPONINI"  in the last 168 hours. BNP: Invalid input(s): "POCBNP" CBG: No results for input(s): "GLUCAP" in the last 168 hours. D-Dimer No results for input(s): "DDIMER" in the last 72 hours. Hgb A1c No results for input(s): "HGBA1C" in the last 72 hours. Lipid Profile No results for input(s): "  CHOL", "HDL", "LDLCALC", "TRIG", "CHOLHDL", "LDLDIRECT" in the last 72 hours. Thyroid function studies No results for input(s): "TSH", "T4TOTAL", "T3FREE", "THYROIDAB" in the last 72 hours.  Invalid input(s): "FREET3" Anemia work up No results for input(s): "VITAMINB12", "FOLATE", "FERRITIN", "TIBC", "IRON", "RETICCTPCT" in the last 72 hours. Urinalysis    Component Value Date/Time   COLORURINE STRAW (A) 11/16/2021 1135   APPEARANCEUR CLEAR (A) 11/16/2021 1135   APPEARANCEUR Cloudy (A) 05/08/2019 1448   LABSPEC 1.009 11/16/2021 1135   PHURINE 7.0 11/16/2021 1135   GLUCOSEU 150 (A) 11/16/2021 1135   HGBUR NEGATIVE 11/16/2021 1135   BILIRUBINUR NEGATIVE 11/16/2021 1135   BILIRUBINUR Negative 05/08/2019 1448   KETONESUR NEGATIVE 11/16/2021 1135   PROTEINUR 100 (A) 11/16/2021 1135   NITRITE NEGATIVE 11/16/2021 1135   LEUKOCYTESUR NEGATIVE 11/16/2021 1135   Sepsis Labs Recent Labs  Lab 11/16/21 1052 11/17/21 0551  WBC 10.5 7.5   Microbiology Recent Results (from the past 240 hour(s))  SARS Coronavirus 2 by RT PCR (hospital order, performed in [facility] hospital lab) *cepheid single result test* Anterior Nasal Swab     Status: None   Collection Time: 11/16/21 11:35 AM   Specimen: Anterior Nasal Swab  Result Value Ref Range Status   SARS Coronavirus 2 by RT PCR NEGATIVE NEGATIVE Final    Comment: (NOTE) SARS-CoV-2 target nucleic acids are NOT DETECTED.  The SARS-CoV-2 RNA is generally detectable in upper and lower respiratory specimens during the acute phase of infection. The lowest concentration of SARS-CoV-2 viral copies this assay can detect is 250 copies / mL. A negative result does  not preclude SARS-CoV-2 infection and should not be used as the sole basis for treatment or other patient management decisions.  A negative result may occur with improper specimen collection / handling, submission of specimen other than nasopharyngeal swab, presence of viral mutation(s) within the areas targeted by this assay, and inadequate number of viral copies (<250 copies / mL). A negative result must be combined with clinical observations, patient history, and epidemiological information.  Fact Sheet for Patients:   https://www.patel.info/  Fact Sheet for Healthcare Providers: https://hall.com/  This test is not yet approved or  cleared by the [place] FDA and has been authorized for detection and/or diagnosis of SARS-CoV-2 by FDA under an Emergency Use Authorization (EUA).  This EUA will remain in effect (meaning this test can be used) for the duration of the COVID-19 declaration under Section 564(b)(1) of the Act, 21 U.S.C. section 360bbb-3(b)(1), unless the authorization is terminated or revoked sooner.  Performed at [facility], [address] [place], [place] [address]   MRSA Next Gen by PCR, Nasal     Status: Abnormal   Collection Time: 11/18/21  9:54 AM   Specimen: Nasal Mucosa; Nasal Swab  Result Value Ref Range Status   MRSA by PCR Next Gen DETECTED (A) NOT DETECTED Final    Comment: RESULT CALLED TO, READ BACK BY AND VERIFIED WITH: MIA GARNER 11/18/21 1215 MW (NOTE) The GeneXpert MRSA Assay (FDA approved for NASAL specimens only), is one component of a comprehensive MRSA colonization surveillance program. It is not intended to diagnose MRSA infection nor to guide or monitor treatment for MRSA infections. Test performance is not FDA approved in patients less than 17 years old. Performed at [facility], [address] [place], [place] [address]    Imaging CT Angio Chest/Abd/Pel for  Dissection W and/or Wo Contrast  Result Date: 11/16/2021 CLINICAL DATA:  Acute aortic syndrome  suspected. EXAM: CT ANGIOGRAPHY CHEST, ABDOMEN AND PELVIS TECHNIQUE: Non-contrast CT of the chest was initially obtained. Multidetector CT imaging through the chest, abdomen and pelvis was performed using the standard protocol during bolus administration of intravenous contrast. Multiplanar reconstructed images and MIPs were obtained and reviewed to evaluate the vascular anatomy. RADIATION DOSE REDUCTION: This exam was performed according to the departmental dose-optimization program which includes automated exposure control, adjustment of the mA and/or kV according to patient size and/or use of iterative reconstruction technique. CONTRAST:  51m OMNIPAQUE IOHEXOL 350 MG/ML SOLN COMPARISON:  None Available. FINDINGS: CTA CHEST FINDINGS Cardiovascular: Preferential opacification of the thoracic aorta. No evidence of thoracic aortic aneurysm or dissection. Normal heart size. No pericardial effusion. Mediastinum/Nodes: No enlarged mediastinal, hilar, or axillary lymph nodes. Thyroid gland, trachea, and esophagus demonstrate no significant findings. Lungs/Pleura: Emphysematous changes of the lungs. No focal consolidation or pleural effusion. Musculoskeletal: Sternotomy wires were noted. No acute osseous abnormality. Review of the MIP images confirms the above findings. CTA ABDOMEN AND PELVIS FINDINGS VASCULAR Aorta: Moderate atherosclerotic calcification of abdominal aorta. Focal dilatation of the infrarenal abdominal aorta measuring 2.5 x 2.9 cm. Celiac: Patent without evidence of aneurysm, dissection, vasculitis or significant stenosis. SMA: Patent without evidence of aneurysm, dissection, vasculitis or significant stenosis. Renals: Both renal arteries are patent without evidence of aneurysm, dissection, vasculitis, fibromuscular dysplasia or significant stenosis. IMA: Patent without evidence of aneurysm, dissection,  vasculitis or significant stenosis. Inflow: Patent without evidence of aneurysm, dissection, vasculitis or significant stenosis. Veins: No obvious venous abnormality within the limitations of this arterial phase study. Review of the MIP images confirms the above findings. NON-VASCULAR Hepatobiliary: Multiple hepatic lesions with peripheral discontinuous enhancement suggesting hemangiomas. No gallstones, gallbladder wall thickening, or biliary dilatation. Pancreas: Unremarkable. No pancreatic ductal dilatation or surrounding inflammatory changes. Spleen: Normal in size without focal abnormality. Adrenals/Urinary Tract: Symmetric perfusion of bilateral kidneys. Mild bilateral renal cortical atrophy. Right parapelvic renal cysts. No evidence of hydronephrosis. Thickening of the urinary bladder wall. Stomach/Bowel: Stomach is within normal limits. Appendix appears normal. No evidence of bowel wall thickening, distention, or inflammatory changes. Lymphatic: No lymphadenopathy. Reproductive: Prostate is unremarkable. Other: No abdominal wall hernia or abnormality. No abdominopelvic ascites. Musculoskeletal: Advanced levoscoliosis centered at L1 vertebral body. Mild bilateral hip osteoarthritis. No acute fracture or dislocation. Review of the MIP images confirms the above findings. IMPRESSION: 1.  No evidence of aortic dissection or acute vascular abnormality. 2. Emphysematous changes of the lungs without evidence of pneumonia or pulmonary edema. 3. Moderate atherosclerotic disease of abdominal aorta and branch vessels. Mildly dilated infrarenal abdominal aorta measuring 2.5 x 2.9 cm. Follow-up examination five years is recommended. 5. Thickening of the urinary bladder wall suggesting cystitis, correlate with urinalysis. 6. Moderate levoscoliosis of the lumbar spine centered at L1 vertebral body. Degenerate disc disease of the lumbar spine. Bilateral hip osteoarthritis. 6.  Additional chronic findings as above.  Electronically Signed   By: IKeane PoliceD.O.   On: 11/16/2021 13:12   CT Renal Stone Study  Result Date: 11/16/2021 CLINICAL DATA:  Lower back pain and abdominal pain. EXAM: CT ABDOMEN AND PELVIS WITHOUT CONTRAST TECHNIQUE: Multidetector CT imaging of the abdomen and pelvis was performed following the standard protocol without IV contrast. RADIATION DOSE REDUCTION: This exam was performed according to the departmental dose-optimization program which includes automated exposure control, adjustment of the mA and/or kV according to patient size and/or use of iterative reconstruction technique. COMPARISON:  CT abdomen/pelvis 04/08/2018 FINDINGS: Lower chest: The lung bases  are clear. The imaged heart is unremarkable. Hepatobiliary: Hypodense lesions in the liver are again seen consistent with hemangiomas, unchanged. There are small layering gallstones in the gallbladder. There is no gallbladder wall thickening. There is no biliary ductal dilatation. Pancreas: Grossly unremarkable. Spleen: Unremarkable. Adrenals/Urinary Tract: The adrenals are unremarkable. Right renal atrophy and cortical thinning is unchanged. Chronic right hydronephrosis and proximal hydroureter is unchanged. The distal right ureter is not definitely seen. No obstructing lesion or stone is seen. Fullness of the left renal pelvis is unchanged without frank hydronephrosis or hydroureter. No obstructing stone is seen on the left. There is mild diffuse bladder wall thickening. Stomach/Bowel: There is thickening of the stomach wall as well as thickening of the proximal small bowel wall, overall similar to the prior study from 04/08/2021. There is no convincing evidence of large bowel wall thickening or inflammation. There is no evidence of mechanical obstruction. The appendix is not definitively identified. Vascular/Lymphatic: There is extensive calcified atherosclerotic plaque throughout the mildly aneurysmal abdominal aorta measuring up to 3.0 cm.  There is no abdominal or pelvic lymphadenopathy. Reproductive: The prostate and seminal vesicles are unremarkable. Other: There is small volume ascites in the abdomen. There is no definite organized or drainable fluid collection. There is no free intraperitoneal air. Musculoskeletal: There is no acute osseous abnormality or suspicious osseous lesion. Levoscoliosis is again seen. There has been prior instrumentation of the proximal right femur. IMPRESSION: 1. Thickening of the stomach wall and proximal small bowel wall may reflect gastroenteritis in the correct clinical setting but could also reflect a component of fluid overload. 2. Small volume ascites. 3. Unchanged right renal atrophy and chronic hydronephrosis and proximal hydroureter. No obstructing lesion or stone is seen along the course of either ureter. 4. Small gallstones without evidence of acute cholecystitis. 5. Mildly aneurysmal abdominal aorta measuring up to 3.0 cm. Recommend follow-up ultrasound every 3 years. Electronically Signed   By: Valetta Mole M.D.   On: 11/16/2021 11:23      Time coordinating discharge: over 30 minutes  SIGNED:  Emeterio Reeve DO [facility]

## 2021-11-18 NOTE — TOC Initial Note (Signed)
Transition of Care [facility]) - Initial/Assessment Note    Patient Details  Name: Anthony Hess MRN: [id number] Date of Birth: [birth year]  Transition of Care [facility]) CM/SW Contact:    Candie Chroman, LCSW Phone Number: 11/18/2021, 2:07 PM  Clinical Narrative:  Readmission prevention screen complete. CSW met with patient. No supports at bedside. CSW introduced role and explained that discharge planning would be discussed. Patient does not have a PCP. He is agreeable to an appointment at [facility]. Will schedule once discharge date determined. Typically uses [facility] but feels like meds should be sent to [facility] at discharge. No DME use prior to admission. Patient is currently homeless. 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 He is unsure where he will go at discharge. No further concerns. CSW encouraged patient to contact CSW as needed. CSW will continue to follow patient for support and facilitate discharge once stable.              Patient Goals and CMS Choice        Expected Discharge Plan and Services       Post Acute Care Choice: NA Living arrangements for the past 2 months: Homeless                                      Prior Living Arrangements/Services Living arrangements for the past 2 months: Homeless   Patient language and need for interpreter reviewed:: Yes        Need for Family Participation in Patient Care: Yes (Comment) Care giver support system in place?: Yes (comment)   Criminal Activity/Legal Involvement Pertinent to Current Situation/Hospitalization: No - Comment as needed  Activities of Daily Living Home Assistive Devices/Equipment: Eyeglasses ADL Screening (condition at time of admission) Patient's cognitive ability adequate to safely complete daily activities?: Yes Is the patient deaf or have difficulty hearing?: No Does the patient have difficulty seeing, even when wearing glasses/contacts?:  No Does the patient have difficulty concentrating, remembering, or making decisions?: No Patient able to express need for assistance with ADLs?: Yes Does the patient have difficulty dressing or bathing?: No Independently performs ADLs?: Yes (appropriate for developmental age) Does the patient have difficulty walking or climbing stairs?: No Weakness of Legs: None Weakness of Arms/Hands: None  Permission Sought/Granted                  Emotional Assessment Appearance:: Appears stated age Attitude/Demeanor/Rapport: Engaged, Gracious Affect (typically observed): Accepting, Appropriate, Calm, Pleasant Orientation: : Oriented to Self, Oriented to Place, Oriented to  Time, Oriented to Situation Alcohol / Substance Use: Not Applicable Psych Involvement: No (comment)  Admission diagnosis:  Hypertensive urgency [I16.0] Patient Active Problem List   Diagnosis Date Noted   Hypertensive urgency 11/16/2021   Hyperkalemia 11/16/2021   Chronic back pain 11/16/2021   Abdominal pain    SBP (spontaneous bacterial peritonitis) ([place]) 04/08/2021   Peritonitis associated with peritoneal dialysis ([place]) 04/08/2021   Hypokalemia 04/08/2021   COVID-19 virus infection 04/08/2021   AAA (abdominal aortic aneurysm) ([place]) 03/18/2021   Hypocalcemia    Spontaneous bacterial peritonitis ([place]) 03/04/2021   Incisional hernia, without obstruction or gangrene    PD catheter dysfunction ([place]) 04/13/2020   Chronic kidney disease due to hypertension 01/30/2020   Hyperparathyroidism due to renal insufficiency ([place]) 01/30/2020   Acute peritonitis ([place]) 01/08/2020   Hypotension 01/04/2020   Anemia in ESRD (end-stage renal  disease) ([place]) 01/04/2020   Hyperlipidemia 01/04/2020   Mass of left side of neck 01/04/2020   Senile purpura ([place]) 01/04/2020   Chronic HFrEF (heart failure with reduced ejection fraction) (HCC)    Hydroureteronephrosis    Atrial fibrillation ([place]) 01/05/2019   ESRD (end stage renal  disease) ([place]) 01/05/2019   Anemia in chronic kidney disease (CODE) 01/05/2019   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, [place] [address] Phone: [phone] Fax: [place], [place] [address] Phone: [phone] Fax: [phone]     Social Determinants of Health ([place]) Interventions    Readmission Risk Interventions    11/18/2021    2:04 PM 04/09/2021    1:32 PM 03/16/2021    2:18 PM  Readmission Risk Prevention Plan  Transportation Screening Complete Complete Complete  Social Work Consult for [place] Planning/Counseling   Complete  Palliative Care Screening  Not Applicable Not Applicable  Medication Review Press photographer) Complete Complete Complete  PCP or Specialist appointment within 3-5 days of discharge Complete    SW Recovery Care/Counseling Consult Complete    [place] Not Applicable

## 2021-11-19 LAB — COMPREHENSIVE METABOLIC PANEL
ALT: 23 U/L (ref 0–44)
AST: 22 U/L (ref 15–41)
Albumin: 3.8 g/dL (ref 3.5–5.0)
Alkaline Phosphatase: 51 U/L (ref 38–126)
Anion gap: 15 (ref 5–15)
BUN: 56 mg/dL — ABNORMAL HIGH (ref 8–23)
CO2: 27 mmol/L (ref 22–32)
Calcium: 7.5 mg/dL — ABNORMAL LOW (ref 8.9–10.3)
Chloride: 97 mmol/L — ABNORMAL LOW (ref 98–111)
Creatinine, Ser: 6.63 mg/dL — ABNORMAL HIGH (ref 0.61–1.24)
GFR, Estimated: 9 mL/min — ABNORMAL LOW (ref >60)
Glucose, Bld: 93 mg/dL (ref 70–99)
Potassium: 5.1 mmol/L (ref 3.5–5.1)
Sodium: 139 mmol/L (ref 135–145)
Total Bilirubin: 0.8 mg/dL (ref 0.3–1.2)
Total Protein: 7.2 g/dL (ref 6.5–8.1)

## 2021-11-19 LAB — TROPONIN I (HIGH SENSITIVITY)
Troponin I (High Sensitivity): 21 ng/L — ABNORMAL HIGH (ref <18)
Troponin I (High Sensitivity): 19 ng/L — ABNORMAL HIGH (ref <18)

## 2021-11-19 LAB — LIPASE, BLOOD: Lipase: 110 U/L — ABNORMAL HIGH (ref 11–51)

## 2021-11-19 NOTE — Discharge Instructions (Signed)
   Thank you for choosing us for your health care today!  Please see your primary doctor this week for a follow up appointment.   If you do not have a primary doctor call the following clinics to establish care:  If you have insurance:  [facility] [phone] [address] [place] [address]   [facility]  [phone] [address] [place] [address]   If you do not have insurance:  [facility]  [phone] [address] [place] [address]  Sometimes, in the early stages of certain disease courses it is difficult to detect in the emergency department evaluation -- so, it is important that you continue to monitor your symptoms and call your doctor right away or return to the emergency department if you develop any new or worsening symptoms.  It was my pleasure to care for you today.   Neiko Trivedi S. Smitty Ackerley, MD  

## 2021-11-19 NOTE — ED Notes (Signed)
Lab at bedside to collect blood work 

## 2022-02-01 ENCOUNTER — Telehealth (INDEPENDENT_AMBULATORY_CARE_PROVIDER_SITE_OTHER): Payer: Self-pay | Admitting: Vascular Surgery

## 2022-02-01 NOTE — Telephone Encounter (Signed)
Patient called ready to get procedure scheduled now. Let patient know you would call them back at earliest convenience to get scheduled. Patient voice understanding    Please call and advise

## 2022-02-17 NOTE — Telephone Encounter (Signed)
Patient has an appt in our office to be seen to discuss his procedure, that needs to be scheduled with Dr. Delana Meyer on 03/08/22 at 1:45 pm.

## 2022-03-05 NOTE — Progress Notes (Deleted)
MRN : [id number]  Anthony Hess is a 67 y.o. (06/04/1955) male who presents with chief complaint of check access.  History of Present Illness:   The patient returns to the office for followup status post intervention of the dialysis access.    Procedure 09/11/2021: Removal of infected left brachial axillary AV graft Vein patch angioplasty of the brachial artery.   Following the intervention the excess function was unchanged per the patient.  The patient continues to be experiencing increased bleeding times following decannulation and increased recirculation with diminished efficiency of their dialysis. The patient denies an increase in arm swelling. At the present time the patient denies hand pain.   No recent shortening of the patient's walking distance or new symptoms consistent with claudication.  No history of rest pain symptoms. No new ulcers or wounds of the lower extremities have occurred.   The patient denies amaurosis fugax or recent TIA symptoms. There are no recent neurological changes noted. There is no history of DVT, PE or superficial thrombophlebitis. No recent episodes of angina or shortness of breath documented.    No outpatient medications have been marked as taking for the 03/08/22 encounter (Appointment) with Delana Meyer, Dolores Lory, MD.    Past Medical History:  Diagnosis Date   Aortic atherosclerosis ([place])    Bilateral carotid artery disease ([place])    Bladder cancer ([place])    Coronary artery disease 12/20/2018   a.) LHC 12/20/2018: 50% OM1, 40% OM2, 95% o-pLAD, 75% o=pLCx, 40% mLM, 70% D1, 60% mRCA-1, 50% mRCA-2; refer to CVTS. b.) 4v CABG at [facility] on 12/27/2018: LIMA-LAD, RIMA-PDA, seg LRA-OM1-D1   DCM (dilated cardiomyopathy) ([place]) 12/05/2018   a.) TTE 12/05/2018: EF 40-45%. b.) TTE 12/28/2019: EF 20-25%.   ESRD (end stage renal disease) ([place])    a.) T-Th-Sat   HFrEF (heart failure with reduced ejection fraction) ([place]) 12/05/2018   a.) TTE  12/05/2018: EF 40-45%; mild LVH; ant/apical/sep HK; mild TR . b.) TTE 12/28/2019: EF 20-25%; mod LVH; mod MR/AR; G1DD.   History of 2019 novel coronavirus disease (COVID-19) 04/08/2021   History of kidney stones    HLD (hyperlipidemia)    Hypertension    Infrarenal abdominal aortic aneurysm (AAA) without rupture ([place]) 03/05/2021   a.) CT abd/pelvis; measured 3.2 cm.   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   a.) LIMA-LAD, RIMA-PDA, sequential LEFT radial artery to OM1 and D1   Sepsis ([employer]) 03/14/2021   Wears glasses     Past Surgical History:  Procedure Laterality Date   AV FISTULA PLACEMENT Left 07/30/2021   Procedure: INSERTION OF ARTERIOVENOUS (AV) GORE-TEX GRAFT ARM BRACHIAL ARTERY TO AXILLARY VEIN;  Surgeon: Algernon Huxley, MD;  Location: [facility] ORS;  Service: Vascular;  Laterality: Left;   CAPD INSERTION N/A 12/31/2019   Procedure: LAPAROSCOPIC INSERTION CONTINUOUS AMBULATORY PERITONEAL DIALYSIS  (CAPD) CATHETER;  Surgeon: Jules Husbands, MD;  Location: [facility] ORS;  Service: General;  Laterality: N/A;   CAPD REMOVAL N/A 04/10/2020   Procedure: LAPAROSCOPIC REVISION OF CONTINUOUS AMBULATORY PERITONEAL DIALYSIS  (CAPD) CATHETER;  Surgeon: Jules Husbands, MD;  Location: [facility] ORS;  Service: General;  Laterality: N/A;   CORONARY ARTERY BYPASS GRAFT N/A 12/27/2018   Procedure: CORONARY ARTERY BYPASS GRAFTING (CABG) X 4 ON PUMP USING RIGHT & LEFT INTERNAL MAMMARY ARTERY  LEFT RADIAL ARTERY ENDOSCOPICALLY HARVESTED;  Surgeon: [name], MD;  [facility];  Service: Open Heart Surgery;  Laterality: N/A;   CYSTOSCOPY W/ RETROGRADES Bilateral 05/15/2019   Procedure: CYSTOSCOPY WITH RETROGRADE PYELOGRAM;  Surgeon: Abbie Sons, MD;  Location: [facility] ORS;  Service: Urology;  Laterality: Bilateral;   CYSTOSCOPY WITH BIOPSY N/A 05/15/2019   Procedure: CYSTOSCOPY WITH bladder BIOPSY;  Surgeon: Abbie Sons, MD;  Location: [facility] ORS;   Service: Urology;  Laterality: N/A;   DIALYSIS/PERMA CATHETER INSERTION N/A 12/28/2019   Procedure: DIALYSIS/PERMA CATHETER INSERTION;  Surgeon: Algernon Huxley, MD;  Location: [facility] CV LAB;  Service: Cardiovascular;  Laterality: N/A;   DIALYSIS/PERMA CATHETER INSERTION N/A 03/18/2021   Procedure: DIALYSIS/PERMA CATHETER INSERTION;  Surgeon: Algernon Huxley, MD;  Location: [facility] CV LAB;  Service: Cardiovascular;  Laterality: N/A;   DIALYSIS/PERMA CATHETER REMOVAL N/A 06/02/2020   Procedure: DIALYSIS/PERMA CATHETER REMOVAL;  Surgeon: Algernon Huxley, MD;  Location: [facility] CV LAB;  Service: Cardiovascular;  Laterality: N/A;   EXCHANGE OF A DIALYSIS CATHETER Right 04/10/2020   Procedure: EXCHANGE OF A DIALYSIS CATHETER;  Surgeon: Jules Husbands, MD;  Location: [facility];  Service: General;  Laterality: Right;   [place]  01/20/2021   Procedure: HERNIA REPAIR INCISIONAL;  Surgeon: Olean Ree, MD;  Location: [facility] ORS;  Service: General;;   IR IMAGE GUIDED DRAINAGE PERCUT CATH  PERITONEAL RETROPERIT  04/07/2020   LAPAROSCOPY N/A 04/16/2021   Procedure: LAPAROSCOPY DIAGNOSTIC;  Surgeon: Benjamine Sprague, DO;  Location: [facility] OR[facility];  Service: General;  Laterality: N/A;   LEFT HEART CATH AND CORONARY ANGIOGRAPHY Left 12/20/2018   Procedure: LEFT HEART CATH AND CORONARY ANGIOGRAPHY;  Surgeon: Isaias Cowman, MD;  Location: [facility] CV LAB;  Service: Cardiovascular;  Laterality: Left;   RADIAL ARTERY HARVEST Left 12/27/2018   Procedure: ENDOSCOPIC RADIAL ARTERY HARVEST;  Surgeon: [name], MD;  Location: [facility];  Service: Open Heart Surgery;  Laterality: Left;   REMOVAL OF A DIALYSIS CATHETER Left 03/20/2021   Procedure: REMOVAL OF A PD CATHETER;  Surgeon: Algernon Huxley, MD;  Location: [facility];  Service: Vascular;  Laterality: Left;   REVISION OF ARTERIOVENOUS GORETEX GRAFT Left 09/11/2021   Procedure: Excisionof infected AV graft;  Surgeon: Katha Cabal, MD;   Location: [facility] OR[facility];  Service: Vascular;  Laterality: Left;   TEE WITHOUT CARDIOVERSION N/A 12/27/2018   Procedure: TRANSESOPHAGEAL ECHOCARDIOGRAM (TEE);  Surgeon: [name], MD;  Location: [facility];  Service: Open Heart Surgery;  Laterality: N/A;   TUMOR REMOVAL  2019   Bladder    Social History Social History   Tobacco Use   Smoking status: Former    Packs/day: 0.25    Types: Cigarettes    Quit date: 02/19/2019    Years since quitting: 3.0   Smokeless tobacco: Never  Vaping Use   Vaping Use: Never used  Substance Use Topics   Alcohol use: Never   Drug use: Never    Family History Family History  Family history unknown: Yes    No Known Allergies   REVIEW OF SYSTEMS (Negative unless checked)  Constitutional: []$ Weight loss  []$ Fever  []$ Chills Cardiac: []$ Chest pain   []$ Chest pressure   []$ Palpitations   []$ Shortness of breath when laying flat   []$ Shortness of breath with exertion. Vascular:  []$ Pain in legs with walking   []$ Pain in legs at rest  []$ History of DVT   []$ Phlebitis   []$ Swelling in legs   []$   Varicose veins   []$ Non-healing ulcers Pulmonary:   []$ Uses home oxygen   []$ Productive cough   []$ Hemoptysis   []$ Wheeze  []$ COPD   []$ Asthma Neurologic:  []$ Dizziness   []$ Seizures   []$ History of stroke   []$ History of TIA  []$ Aphasia   []$ Vissual changes   []$ Weakness or numbness in arm   []$ Weakness or numbness in leg Musculoskeletal:   []$ Joint swelling   []$ Joint pain   []$ Low back pain Hematologic:  []$ Easy bruising  []$ Easy bleeding   []$ Hypercoagulable state   []$ Anemic Gastrointestinal:  []$ Diarrhea   []$ Vomiting  []$ Gastroesophageal reflux/heartburn   []$ Difficulty swallowing. Genitourinary:  [x]$ Chronic kidney disease   []$ Difficult urination  []$ Frequent urination   []$ Blood in urine Skin:  []$ Rashes   []$ Ulcers  Psychological:  []$ History of anxiety   []$  History of major depression.  Physical Examination  There were no vitals filed for this visit. There is no height or weight on file to  calculate BMI. Gen: WD/WN, NAD Head: [place]/AT, No temporalis wasting.  Ear/Nose/Throat: Hearing grossly intact, nares w/o erythema or drainage Eyes: PER, EOMI, sclera nonicteric.  Neck: Supple, no gross masses or lesions.  No JVD.  Pulmonary:  Good air movement, no audible wheezing, no use of accessory muscles.  Cardiac: RRR, precordium non-hyperdynamic. Vascular:   *** Vessel Right Left  Radial Palpable Palpable  Brachial Palpable Palpable  Gastrointestinal: soft, non-distended. No guarding/no peritoneal signs.  Musculoskeletal: M/S 5/5 throughout.  No deformity.  Neurologic: CN 2-12 intact. Pain and light touch intact in extremities.  Symmetrical.  Speech is fluent. Motor exam as listed above. Psychiatric: Judgment intact, Mood & affect appropriate for pt's clinical situation. Dermatologic: No rashes or ulcers noted.  No changes consistent with cellulitis.   CBC Lab Results  Component Value Date   WBC 11.3 (H) 11/18/2021   HGB 11.6 (L) 11/18/2021   HCT 37.8 (L) 11/18/2021   MCV 93.8 11/18/2021   PLT 295 11/18/2021    BMET    Component Value Date/Time   NA 139 11/18/2021 2315   NA 143 01/04/2020 1542   K 5.1 11/18/2021 2315   CL 97 (L) 11/18/2021 2315   CO2 27 11/18/2021 2315   GLUCOSE 93 11/18/2021 2315   BUN 56 (H) 11/18/2021 2315   BUN 52 (H) 01/04/2020 1542   CREATININE 6.63 (H) 11/18/2021 2315   CREATININE 4.62 (H) 01/10/2019 1351   CALCIUM 7.5 (L) 11/18/2021 2315   GFRNONAA 9 (L) 11/18/2021 2315   GFRAA 15 (L) 01/04/2020 1542   CrCl cannot be calculated (Patient's most recent lab result is older than the maximum 21 days allowed.).  COAG Lab Results  Component Value Date   INR 1.0 11/16/2021   INR 1.7 (H) 03/14/2021   INR 1.2 03/05/2021    Radiology No results found.   Assessment/Plan 1. ESRD (end stage renal disease) ([place]) Recommend:   The patient is experiencing increasing problems with their dialysis access.   Patient should have a left arm  venogram to assess for possible left arm access.  As well as improve the quality of dialysis therapy.   The risks, benefits and alternative therapies were reviewed in detail with the patient.  All questions were answered.  The patient agrees to proceed with angio/intervention.     The patient will follow up with me in the office after the procedure. Hortencia Pilar, MD  03/05/2022 4:43 PM

## 2022-03-08 ENCOUNTER — Ambulatory Visit (INDEPENDENT_AMBULATORY_CARE_PROVIDER_SITE_OTHER): Payer: Medicare Other | Admitting: Vascular Surgery

## 2022-03-08 DIAGNOSIS — I7143 Infrarenal abdominal aortic aneurysm, without rupture: Secondary | ICD-10-CM

## 2022-03-08 DIAGNOSIS — N186 End stage renal disease: Secondary | ICD-10-CM

## 2022-03-08 DIAGNOSIS — I10 Essential (primary) hypertension: Secondary | ICD-10-CM

## 2022-03-08 DIAGNOSIS — I4891 Unspecified atrial fibrillation: Secondary | ICD-10-CM

## 2022-03-08 DIAGNOSIS — J439 Emphysema, unspecified: Secondary | ICD-10-CM

## 2022-04-05 DIAGNOSIS — I5022 Chronic systolic (congestive) heart failure: Secondary | ICD-10-CM

## 2022-04-18 ENCOUNTER — Inpatient Hospital Stay
Admission: EM | Admit: 2022-04-18 | Discharge: 2022-04-21 | DRG: 175 | Disposition: A | Discharge status: Home / Self Care | Payer: Medicare Other | Attending: Internal Medicine | Admitting: Internal Medicine

## 2022-04-18 ENCOUNTER — Emergency Department: Payer: Medicare Other

## 2022-04-18 ENCOUNTER — Other Ambulatory Visit: Payer: Self-pay

## 2022-04-18 DIAGNOSIS — Z992 Dependence on renal dialysis: Secondary | ICD-10-CM

## 2022-04-18 DIAGNOSIS — I700 Atherosclerosis of aorta: Secondary | ICD-10-CM | POA: Diagnosis present

## 2022-04-18 DIAGNOSIS — I5022 Chronic systolic (congestive) heart failure: Secondary | ICD-10-CM | POA: Diagnosis present

## 2022-04-18 DIAGNOSIS — Z7901 Long term (current) use of anticoagulants: Secondary | ICD-10-CM

## 2022-04-18 DIAGNOSIS — I2699 Other pulmonary embolism without acute cor pulmonale: Secondary | ICD-10-CM | POA: Diagnosis not present

## 2022-04-18 DIAGNOSIS — Z951 Presence of aortocoronary bypass graft: Secondary | ICD-10-CM

## 2022-04-18 DIAGNOSIS — R0602 Shortness of breath: Secondary | ICD-10-CM

## 2022-04-18 DIAGNOSIS — I2510 Atherosclerotic heart disease of native coronary artery without angina pectoris: Secondary | ICD-10-CM | POA: Diagnosis present

## 2022-04-18 DIAGNOSIS — I132 Hypertensive heart and chronic kidney disease with heart failure and with stage 5 chronic kidney disease, or end stage renal disease: Secondary | ICD-10-CM | POA: Diagnosis present

## 2022-04-18 DIAGNOSIS — I252 Old myocardial infarction: Secondary | ICD-10-CM

## 2022-04-18 DIAGNOSIS — F17200 Nicotine dependence, unspecified, uncomplicated: Secondary | ICD-10-CM | POA: Insufficient documentation

## 2022-04-18 DIAGNOSIS — Z7982 Long term (current) use of aspirin: Secondary | ICD-10-CM

## 2022-04-18 DIAGNOSIS — F17210 Nicotine dependence, cigarettes, uncomplicated: Secondary | ICD-10-CM | POA: Diagnosis present

## 2022-04-18 DIAGNOSIS — I5042 Chronic combined systolic (congestive) and diastolic (congestive) heart failure: Secondary | ICD-10-CM | POA: Diagnosis present

## 2022-04-18 DIAGNOSIS — R079 Chest pain, unspecified: Secondary | ICD-10-CM

## 2022-04-18 DIAGNOSIS — I21A1 Myocardial infarction type 2: Secondary | ICD-10-CM | POA: Diagnosis present

## 2022-04-18 DIAGNOSIS — Z79899 Other long term (current) drug therapy: Secondary | ICD-10-CM

## 2022-04-18 DIAGNOSIS — E785 Hyperlipidemia, unspecified: Secondary | ICD-10-CM | POA: Diagnosis present

## 2022-04-18 DIAGNOSIS — Z8249 Family history of ischemic heart disease and other diseases of the circulatory system: Secondary | ICD-10-CM

## 2022-04-18 DIAGNOSIS — I214 Non-ST elevation (NSTEMI) myocardial infarction: Principal | ICD-10-CM

## 2022-04-18 DIAGNOSIS — Z8551 Personal history of malignant neoplasm of bladder: Secondary | ICD-10-CM

## 2022-04-18 DIAGNOSIS — J439 Emphysema, unspecified: Secondary | ICD-10-CM | POA: Diagnosis present

## 2022-04-18 DIAGNOSIS — Z7902 Long term (current) use of antithrombotics/antiplatelets: Secondary | ICD-10-CM

## 2022-04-18 DIAGNOSIS — Z8616 Personal history of COVID-19: Secondary | ICD-10-CM

## 2022-04-18 DIAGNOSIS — I160 Hypertensive urgency: Secondary | ICD-10-CM | POA: Diagnosis present

## 2022-04-18 DIAGNOSIS — N2581 Secondary hyperparathyroidism of renal origin: Secondary | ICD-10-CM | POA: Diagnosis present

## 2022-04-18 DIAGNOSIS — E875 Hyperkalemia: Secondary | ICD-10-CM | POA: Diagnosis present

## 2022-04-18 DIAGNOSIS — D631 Anemia in chronic kidney disease: Secondary | ICD-10-CM | POA: Diagnosis present

## 2022-04-18 DIAGNOSIS — N186 End stage renal disease: Secondary | ICD-10-CM | POA: Diagnosis present

## 2022-04-18 DIAGNOSIS — R1084 Generalized abdominal pain: Secondary | ICD-10-CM

## 2022-04-18 DIAGNOSIS — I739 Peripheral vascular disease, unspecified: Secondary | ICD-10-CM | POA: Diagnosis present

## 2022-04-18 LAB — CBC WITH DIFFERENTIAL/PLATELET
Abs Immature Granulocytes: 0.06 10*3/uL (ref 0.00–0.07)
Basophils Absolute: 0 10*3/uL (ref 0.0–0.1)
Basophils Relative: 0 %
Eosinophils Absolute: 0.6 10*3/uL — ABNORMAL HIGH (ref 0.0–0.5)
Eosinophils Relative: 5 %
HCT: 40.2 % (ref 39.0–52.0)
Hemoglobin: 12.2 g/dL — ABNORMAL LOW (ref 13.0–17.0)
Immature Granulocytes: 1 %
Lymphocytes Relative: 14 %
Lymphs Abs: 1.5 10*3/uL (ref 0.7–4.0)
MCH: 28.4 pg (ref 26.0–34.0)
MCHC: 30.3 g/dL (ref 30.0–36.0)
MCV: 93.5 fL (ref 80.0–100.0)
Monocytes Absolute: 1.6 10*3/uL — ABNORMAL HIGH (ref 0.1–1.0)
Monocytes Relative: 14 %
Neutro Abs: 7.4 10*3/uL (ref 1.7–7.7)
Neutrophils Relative %: 66 %
Platelets: 268 10*3/uL (ref 150–400)
RBC: 4.3 MIL/uL (ref 4.22–5.81)
RDW: 17.6 % — ABNORMAL HIGH (ref 11.5–15.5)
WBC: 11.1 10*3/uL — ABNORMAL HIGH (ref 4.0–10.5)
nRBC: 0 % (ref 0.0–0.2)

## 2022-04-18 MED ORDER — PANTOPRAZOLE SODIUM 40 MG IV SOLR
40.0000 mg | Freq: Once | INTRAVENOUS | Status: AC
  Administered 2022-04-19: 40 mg via INTRAVENOUS
  Filled 2022-04-18: qty 10

## 2022-04-18 MED ORDER — ONDANSETRON HCL 4 MG/2ML IJ SOLN
4.0000 mg | Freq: Once | INTRAMUSCULAR | Status: AC
  Administered 2022-04-19: 4 mg via INTRAVENOUS
  Filled 2022-04-18: qty 2

## 2022-04-18 NOTE — ED Provider Notes (Signed)
[facility] Provider Note    Event Date/Time   First MD Initiated Contact with Patient 04/18/22 2326     (approximate)   History   Shortness of Breath   HPI  Anthony Hess is a 67 y.o. male patient here with history of CAD status post CABG, end-stage renal disease on hemodialysis Tuesday, Thursday and Saturday with noncompliance, CHF, hypertension, hyperlipidemia who presents to the emergency department with 3 weeks of shortness of breath that he states is getting worse.  Also reports chest pain that he describes as a burning but when asked where his chest pain is located he points to his abdomen.  He reports vomiting yesterday.  No diarrhea.  No fever or cough.  No extremity swelling or pain.   History provided by patient, EMS.    Past Medical History:  Diagnosis Date   Aortic atherosclerosis ([place])    Bilateral carotid artery disease ([place])    Bladder cancer ([place])    Coronary artery disease 12/20/2018   a.) LHC 12/20/2018: 50% OM1, 40% OM2, 95% o-pLAD, 75% o=pLCx, 40% mLM, 70% D1, 60% mRCA-1, 50% mRCA-2; refer to CVTS. b.) 4v CABG at [facility] on 12/27/2018: LIMA-LAD, RIMA-PDA, seg LRA-OM1-D1   DCM (dilated cardiomyopathy) ([place]) 12/05/2018   a.) TTE 12/05/2018: EF 40-45%. b.) TTE 12/28/2019: EF 20-25%.   ESRD (end stage renal disease) ([place])    a.) T-Th-Sat   HFrEF (heart failure with reduced ejection fraction) ([place]) 12/05/2018   a.) TTE 12/05/2018: EF 40-45%; mild LVH; ant/apical/sep HK; mild TR . b.) TTE 12/28/2019: EF 20-25%; mod LVH; mod MR/AR; G1DD.   History of 2019 novel coronavirus disease (COVID-19) 04/08/2021   History of kidney stones    HLD (hyperlipidemia)    Hypertension    Infrarenal abdominal aortic aneurysm (AAA) without rupture ([place]) 03/05/2021   a.) CT abd/pelvis; measured 3.2 cm.   Myocardial infarction [facility])    PVD (peripheral vascular disease) ([place])    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   a.) LIMA-LAD, RIMA-PDA, sequential LEFT radial artery to  OM1 and D1   Sepsis ([place]) 03/14/2021   Wears glasses     Past Surgical History:  Procedure Laterality Date   AV FISTULA PLACEMENT Left 07/30/2021   Procedure: INSERTION OF ARTERIOVENOUS (AV) GORE-TEX GRAFT ARM BRACHIAL ARTERY TO AXILLARY VEIN;  Surgeon: Algernon Huxley, MD;  Location: [facility] ORS;  Service: Vascular;  Laterality: Left;   CAPD INSERTION N/A 12/31/2019   Procedure: LAPAROSCOPIC INSERTION CONTINUOUS AMBULATORY PERITONEAL DIALYSIS  (CAPD) CATHETER;  Surgeon: Jules Husbands, MD;  Location: [facility];  Service: General;  Laterality: N/A;   CAPD REMOVAL N/A 04/10/2020   Procedure: LAPAROSCOPIC REVISION OF CONTINUOUS AMBULATORY PERITONEAL DIALYSIS  (CAPD) CATHETER;  Surgeon: Jules Husbands, MD;  Location: [facility];  Service: General;  Laterality: N/A;   CORONARY ARTERY BYPASS GRAFT N/A 12/27/2018   Procedure: CORONARY ARTERY BYPASS GRAFTING (CABG) X 4 ON PUMP USING RIGHT & LEFT INTERNAL MAMMARY ARTERY LEFT RADIAL ARTERY ENDOSCOPICALLY HARVESTED;  Surgeon: [name], MD;  Location: [place];  Service: Open Heart Surgery;  Laterality: N/A;   CYSTOSCOPY W/ RETROGRADES Bilateral 05/15/2019   Procedure: CYSTOSCOPY WITH RETROGRADE PYELOGRAM;  Surgeon: Abbie Sons, MD;  Location: [facility];  Service: Urology;  Laterality: Bilateral;   CYSTOSCOPY WITH BIOPSY N/A 05/15/2019   Procedure: CYSTOSCOPY WITH bladder BIOPSY;  Surgeon: Abbie Sons, MD;  Location: [facility];  Service: Urology;  Laterality: N/A;   DIALYSIS/PERMA CATHETER INSERTION N/A 12/28/2019  Procedure: DIALYSIS/PERMA CATHETER INSERTION;  Surgeon: Algernon Huxley, MD;  Location: [place] CV LAB;  Service: Cardiovascular;  Laterality: N/A;   DIALYSIS/PERMA CATHETER INSERTION N/A 03/18/2021   Procedure: DIALYSIS/PERMA CATHETER INSERTION;  Surgeon: Algernon Huxley, MD;  Location: [place] CV LAB;  Service: Cardiovascular;  Laterality: N/A;   DIALYSIS/PERMA CATHETER REMOVAL N/A 06/02/2020   Procedure: DIALYSIS/PERMA CATHETER REMOVAL;   Surgeon: Algernon Huxley, MD;  Location: [place] CV LAB;  Service: Cardiovascular;  Laterality: N/A;   EXCHANGE OF A DIALYSIS CATHETER Right 04/10/2020   Procedure: EXCHANGE OF A DIALYSIS CATHETER;  Surgeon: Jules Husbands, MD;  Location: [facility];  Service: General;  Laterality: Right;   [place]  01/20/2021   Procedure: HERNIA REPAIR INCISIONAL;  Surgeon: Olean Ree, MD;  Location: [facility];  Service: General;;   IR IMAGE GUIDED DRAINAGE PERCUT CATH  PERITONEAL RETROPERIT  04/07/2020   LAPAROSCOPY N/A 04/16/2021   Procedure: LAPAROSCOPY DIAGNOSTIC;  Surgeon: Benjamine Sprague, DO;  Location: [facility];  Service: General;  Laterality: N/A;   LEFT HEART CATH AND CORONARY ANGIOGRAPHY Left 12/20/2018   Procedure: LEFT HEART CATH AND CORONARY ANGIOGRAPHY;  Surgeon: Isaias Cowman, MD;  Location: [place] CV LAB;  Service: Cardiovascular;  Laterality: Left;   RADIAL ARTERY HARVEST Left 12/27/2018   Procedure: ENDOSCOPIC RADIAL ARTERY HARVEST;  Surgeon: [name], MD;  Location: [place];  Service: Open Heart Surgery;  Laterality: Left;   REMOVAL OF A DIALYSIS CATHETER Left 03/20/2021   Procedure: REMOVAL OF A PD CATHETER;  Surgeon: Algernon Huxley, MD;  Location: [facility];  Service: Vascular;  Laterality: Left;   REVISION OF ARTERIOVENOUS GORETEX GRAFT Left 09/11/2021   Procedure: Excisionof infected AV graft;  Surgeon: Katha Cabal, MD;  Location: [facility];  Service: Vascular;  Laterality: Left;   TEE WITHOUT CARDIOVERSION N/A 12/27/2018   Procedure: TRANSESOPHAGEAL ECHOCARDIOGRAM (TEE);  Surgeon: [name], MD;  Location: [place];  Service: Open Heart Surgery;  Laterality: N/A;   TUMOR REMOVAL  2019   Bladder    MEDICATIONS:  Prior to Admission medications   Medication Sig Start Date End Date Taking? Authorizing Provider  acetaminophen (TYLENOL) 500 MG tablet Take 2 tablets (1,000 mg total) by mouth every 6 (six) hours as needed for mild pain, fever or  headache (or Fever >/= 101). 11/18/21   Emeterio Reeve, DO  amLODipine (NORVASC) 10 MG tablet Take 1 tablet (10 mg total) by mouth daily. 11/18/21   Emeterio Reeve, DO  aspirin EC 81 MG tablet Take 1 tablet (81 mg total) by mouth daily. Swallow whole. 11/19/21   Emeterio Reeve, DO  atorvastatin (LIPITOR) 80 MG tablet Take 0.5 tablets (40 mg total) by mouth daily. 11/18/21   Emeterio Reeve, DO  calcitRIOL (ROCALTROL) 0.5 MCG capsule Take 1 capsule (0.5 mcg total) by mouth daily. 11/18/21   Emeterio Reeve, DO  calcium acetate (PHOSLO) 667 MG capsule Take 1 capsule (667 mg total) by mouth 3 (three) times daily with meals. 11/18/21   Emeterio Reeve, DO  carvedilol (COREG) 25 MG tablet Take 0.5 tablets (12.5 mg total) by mouth 2 (two) times daily with a meal. 11/18/21   Emeterio Reeve, DO  hydrALAZINE (APRESOLINE) 50 MG tablet Take 0.5 tablets (25 mg total) by mouth 3 (three) times daily. 11/18/21 12/18/21  Emeterio Reeve, DO  multivitamin (RENA-VIT) TABS tablet Take 1 tablet by mouth at bedtime. 03/23/21   Ezekiel Slocumb, DO  Nutritional Supplements (FEEDING SUPPLEMENT, NEPRO  CARB STEADY,) LIQD Take 237 mLs by mouth 3 (three) times daily between meals. 03/23/21   Ezekiel Slocumb, DO  senna-docusate (SENOKOT-S) 8.6-50 MG tablet Take 2 tablets by mouth at bedtime as needed for mild constipation or moderate constipation. 11/18/21   Emeterio Reeve, DO  sucroferric oxyhydroxide (VELPHORO) 500 MG chewable tablet Chew 1 tablet (500 mg total) by mouth 3 (three) times daily with meals. 11/18/21   Emeterio Reeve, DO  Vitamin D, Ergocalciferol, (DRISDOL) 1.25 MG (50000 UNIT) CAPS capsule Take 1 capsule (50,000 Units total) by mouth every 7 (seven) days. 11/18/21   Emeterio Reeve, DO  zinc sulfate 220 (50 Zn) MG capsule Take 1 capsule (220 mg total) by mouth daily. 11/18/21   Emeterio Reeve, DO    Physical Exam   Triage Vital Signs: ED Triage Vitals  Enc Vitals Group      BP 04/18/22 2330 (!) 165/113     Pulse Rate 04/18/22 2330 77     Resp 04/18/22 2330 20     Temp 04/18/22 2335 97.9 F (36.6 C)     Temp Source 04/18/22 2335 Oral     SpO2 04/18/22 2330 100 %     Weight 04/18/22 2331 137 lb (62.1 kg)     Height 04/18/22 2331 '5\' 3"'$  (1.6 m)     Head Circumference --      Peak Flow --      Pain Score 04/18/22 2330 5     Pain Loc --      Pain Edu? --      Excl. in [place]? --     Most recent vital signs: Vitals:   04/19/22 0030 04/19/22 0100  BP: (!) 155/101 (!) 169/104  Pulse: 74 74  Resp: (!) 22 20  Temp:    SpO2: 99% 100%    CONSTITUTIONAL: Alert, responds appropriately to questions.  Chronically ill-appearing HEAD: Normocephalic, atraumatic EYES: Conjunctivae clear, pupils appear equal, sclera nonicteric ENT: normal nose; moist mucous membranes NECK: Supple, normal ROM CARD: RRR; S1 and S2 appreciated RESP: Normal chest excursion without splinting or tachypnea; breath sounds clear and equal bilaterally; no wheezes, no rhonchi, no rales, no hypoxia or respiratory distress, speaking full sentences ABD/GI: Non-distended; soft, tender to palpation diffusely without guarding or rebound BACK: The back appears normal EXT: Normal ROM in all joints; no deformity noted, no edema, no calf tenderness or calf swelling SKIN: Normal color for age and race; warm; no rash on exposed skin NEURO: Moves all extremities equally, normal speech PSYCH: The patient's mood and manner are appropriate.   ED Results / Procedures / Treatments   LABS: (all labs ordered are listed, but only abnormal results are displayed) Labs Reviewed  CBC WITH DIFFERENTIAL/PLATELET - Abnormal; Notable for the following components:      Result Value   WBC 11.1 (*)    Hemoglobin 12.2 (*)    RDW 17.6 (*)    Monocytes Absolute 1.6 (*)    Eosinophils Absolute 0.6 (*)    All other components within normal limits  COMPREHENSIVE METABOLIC PANEL - Abnormal; Notable for the following  components:   Potassium 5.4 (*)    BUN 62 (*)    Creatinine, Ser 8.20 (*)    Calcium 7.1 (*)    GFR, Estimated 7 (*)    Anion gap 19 (*)    All other components within normal limits  LIPASE, BLOOD - Abnormal; Notable for the following components:   Lipase 56 (*)    All other components  within normal limits  BRAIN NATRIURETIC PEPTIDE - Abnormal; Notable for the following components:   B Natriuretic Peptide 843.6 (*)    All other components within normal limits  TROPONIN I (HIGH SENSITIVITY) - Abnormal; Notable for the following components:   Troponin I (High Sensitivity) 467 (*)    All other components within normal limits  APTT  PROTIME-INR  URINALYSIS, ROUTINE W REFLEX MICROSCOPIC  HEPARIN LEVEL (UNFRACTIONATED)  TROPONIN I (HIGH SENSITIVITY)     EKG:  EKG Interpretation  Date/Time:  Sunday April 18 2022 23:41:33 EST Ventricular Rate:  75 PR Interval:  128 QRS Duration: 118 QT Interval:  448 QTC Calculation: 501 R Axis:   -12 Text Interpretation: Ectopic atrial rhythm LAD, consider left anterior fascicular block Nonspecific T abnormalities, lateral leads Confirmed by Pryor Curia [phone]) on 04/18/2022 11:56:19 PM        EKG Interpretation  Date/Time:  Monday April 19 2022 00:25:31 EST Ventricular Rate:  79 PR Interval:  123 QRS Duration: 147 QT Interval:  457 QTC Calculation: 524 R Axis:   -22 Text Interpretation: Sinus or ectopic atrial rhythm IVCD, consider atypical RBBB Confirmed by Pryor Curia [phone]) on 04/19/2022 12:33:51 AM         RADIOLOGY: My personal review and interpretation of imaging: CTA chest shows small PE without right heart strain.  CT of the abdomen pelvis shows no acute abnormality.  I have personally reviewed all radiology reports.   CT ABDOMEN PELVIS W CONTRAST  Result Date: 04/19/2022 CLINICAL DATA:  Abdominal pain, acute, nonlocalized; Pulmonary embolism (PE) suspected, high prob EXAM: CT ANGIOGRAPHY CHEST CT ABDOMEN AND  PELVIS WITH CONTRAST TECHNIQUE: Multidetector CT imaging of the chest was performed using the standard protocol during bolus administration of intravenous contrast. Multiplanar CT image reconstructions and MIPs were obtained to evaluate the vascular anatomy. Multidetector CT imaging of the abdomen and pelvis was performed using the standard protocol during bolus administration of intravenous contrast. RADIATION DOSE REDUCTION: This exam was performed according to the departmental dose-optimization program which includes automated exposure control, adjustment of the mA and/or kV according to patient size and/or use of iterative reconstruction technique. CONTRAST:  146m OMNIPAQUE IOHEXOL 350 MG/ML SOLN COMPARISON:  11/16/2021 FINDINGS: CTA CHEST FINDINGS Cardiovascular: There is adequate opacification of the pulmonary arterial tree. A small intraluminal filling defect is identified within the segmental pulmonary arteries of the left lower lobe in keeping with changes of acute pulmonary embolism. The embolic burden is small. The central pulmonary arteries are of normal caliber. No CT evidence of right heart strain. Coronary artery bypass grafting has been performed. Cardiac size is globally within normal limits though left ventricular hypertrophy is noted. No pericardial effusion. Mild atherosclerotic calcification noted within the thoracic aorta. No aortic aneurysm. Right internal jugular hemodialysis catheter tip noted within the superior right atrium. Mediastinum/Nodes: No enlarged mediastinal, hilar, or axillary lymph nodes. Thyroid gland, trachea, and esophagus demonstrate no significant findings. Lungs/Pleura: Moderate emphysema. No superimposed focal pulmonary infiltrate. No pneumothorax or pleural effusion. No central obstructing lesion. Musculoskeletal: Nonunion of the sternal body noted. No acute bone abnormality. No lytic or blastic bone lesion. Review of the MIP images confirms the above findings. CT  ABDOMEN and PELVIS FINDINGS Hepatobiliary: Multiple simple cysts and at least 3 benign cavernous hemangioma are seen within the liver. No suspicious enhancing intrahepatic masses identified. No intra or extrahepatic biliary ductal dilation. Gallbladder unremarkable. Pancreas: Unremarkable. No pancreatic ductal dilatation or surrounding inflammatory changes. Spleen: Normal in size without focal abnormality. Adrenals/Urinary  Tract: The adrenal glands are unremarkable. The kidneys are markedly atrophic bilaterally but are otherwise unremarkable. The bladder is decompressed Stomach/Bowel: Stomach is within normal limits. Appendix appears normal. No evidence of bowel wall thickening, distention, or inflammatory changes. Vascular/Lymphatic: Extensive aortoiliac atherosclerotic calcification. No aortic aneurysm. No pathologic adenopathy within the abdomen and pelvis. Reproductive: Prostate is unremarkable. Other: No abdominal wall hernia or abnormality. No abdominopelvic ascites. Musculoskeletal: No acute bone abnormality. No lytic or blastic bone lesion. Lumbar levoscoliosis noted. Degenerative changes noted at the lumbosacral junction. Review of the MIP images confirms the above findings. IMPRESSION: 1. Acute pulmonary embolism. The embolic burden is small. No CT evidence of right heart strain. 2. Left ventricular hypertrophy. 3. Moderate emphysema. 4. No acute intra-abdominal pathology identified. 5. Marked bilateral renal atrophy in keeping with chronic renal failure. 6. Extensive aortoiliac atherosclerotic calcification. Aortic Atherosclerosis (ICD10-I70.0) and Emphysema (ICD10-J43.9). Electronically Signed   By: Fidela Salisbury M.D.   On: 04/19/2022 00:57   CT Angio Chest PE W and/or Wo Contrast  Result Date: 04/19/2022 CLINICAL DATA:  Abdominal pain, acute, nonlocalized; Pulmonary embolism (PE) suspected, high prob EXAM: CT ANGIOGRAPHY CHEST CT ABDOMEN AND PELVIS WITH CONTRAST TECHNIQUE: Multidetector CT imaging  of the chest was performed using the standard protocol during bolus administration of intravenous contrast. Multiplanar CT image reconstructions and MIPs were obtained to evaluate the vascular anatomy. Multidetector CT imaging of the abdomen and pelvis was performed using the standard protocol during bolus administration of intravenous contrast. RADIATION DOSE REDUCTION: This exam was performed according to the departmental dose-optimization program which includes automated exposure control, adjustment of the mA and/or kV according to patient size and/or use of iterative reconstruction technique. CONTRAST:  135m OMNIPAQUE IOHEXOL 350 MG/ML SOLN COMPARISON:  11/16/2021 FINDINGS: CTA CHEST FINDINGS Cardiovascular: There is adequate opacification of the pulmonary arterial tree. A small intraluminal filling defect is identified within the segmental pulmonary arteries of the left lower lobe in keeping with changes of acute pulmonary embolism. The embolic burden is small. The central pulmonary arteries are of normal caliber. No CT evidence of right heart strain. Coronary artery bypass grafting has been performed. Cardiac size is globally within normal limits though left ventricular hypertrophy is noted. No pericardial effusion. Mild atherosclerotic calcification noted within the thoracic aorta. No aortic aneurysm. Right internal jugular hemodialysis catheter tip noted within the superior right atrium. Mediastinum/Nodes: No enlarged mediastinal, hilar, or axillary lymph nodes. Thyroid gland, trachea, and esophagus demonstrate no significant findings. Lungs/Pleura: Moderate emphysema. No superimposed focal pulmonary infiltrate. No pneumothorax or pleural effusion. No central obstructing lesion. Musculoskeletal: Nonunion of the sternal body noted. No acute bone abnormality. No lytic or blastic bone lesion. Review of the MIP images confirms the above findings. CT ABDOMEN and PELVIS FINDINGS Hepatobiliary: Multiple simple  cysts and at least 3 benign cavernous hemangioma are seen within the liver. No suspicious enhancing intrahepatic masses identified. No intra or extrahepatic biliary ductal dilation. Gallbladder unremarkable. Pancreas: Unremarkable. No pancreatic ductal dilatation or surrounding inflammatory changes. Spleen: Normal in size without focal abnormality. Adrenals/Urinary Tract: The adrenal glands are unremarkable. The kidneys are markedly atrophic bilaterally but are otherwise unremarkable. The bladder is decompressed Stomach/Bowel: Stomach is within normal limits. Appendix appears normal. No evidence of bowel wall thickening, distention, or inflammatory changes. Vascular/Lymphatic: Extensive aortoiliac atherosclerotic calcification. No aortic aneurysm. No pathologic adenopathy within the abdomen and pelvis. Reproductive: Prostate is unremarkable. Other: No abdominal wall hernia or abnormality. No abdominopelvic ascites. Musculoskeletal: No acute bone abnormality. No lytic or blastic bone  lesion. Lumbar levoscoliosis noted. Degenerative changes noted at the lumbosacral junction. Review of the MIP images confirms the above findings. IMPRESSION: 1. Acute pulmonary embolism. The embolic burden is small. No CT evidence of right heart strain. 2. Left ventricular hypertrophy. 3. Moderate emphysema. 4. No acute intra-abdominal pathology identified. 5. Marked bilateral renal atrophy in keeping with chronic renal failure. 6. Extensive aortoiliac atherosclerotic calcification. Aortic Atherosclerosis (ICD10-I70.0) and Emphysema (ICD10-J43.9). Electronically Signed   By: Fidela Salisbury M.D.   On: 04/19/2022 00:57   DG Chest Portable 1 View  Result Date: 04/19/2022 CLINICAL DATA:  Three weeks history of shortness of breath. EXAM: PORTABLE CHEST 1 VIEW COMPARISON:  CTA chest 11/16/21. 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 No vascular congestion is seen. Right IJ dialysis catheter again noted with tip about the  superior cavoatrial junction. Stable mediastinum with aortic tortuosity and calcific plaque. The lungs are emphysematous but clear. No pleural effusion. S shaped thoracolumbar scoliosis. IMPRESSION: 1. No evidence of acute chest process. Emphysema. 2. Aortic atherosclerosis and tortuosity. Electronically Signed   By: Telford Nab M.D.   On: 04/19/2022 00:07     PROCEDURES:  Critical Care performed: Yes, see critical care procedure note(s)   CRITICAL CARE Performed by: Cyril Mourning Giacomo Valone   Total critical care time: 40 minutes  Critical care time was exclusive of separately billable procedures and treating other patients.  Critical care was necessary to treat or prevent imminent or life-threatening deterioration.  Critical care was time spent personally by me on the following activities: development of treatment plan with patient and/or surrogate as well as nursing, discussions with consultants, evaluation of patient's response to treatment, examination of patient, obtaining history from patient or surrogate, ordering and performing treatments and interventions, ordering and review of laboratory studies, ordering and review of radiographic studies, pulse oximetry and re-evaluation of patient's condition.   Marland Kitchen1-3 Lead EKG Interpretation  Performed by: Eyad Rochford, Delice Bison, DO Authorized by: Trayvond Viets, Delice Bison, DO     Interpretation: normal     ECG rate:  77   ECG rate assessment: normal     Rhythm: sinus rhythm     Ectopy: none     Conduction: normal       IMPRESSION / MDM / ASSESSMENT AND PLAN / ED COURSE  I reviewed the triage vital signs and the nursing notes.    Patient here with complaints of shortness of breath progressively worsening for 3 weeks with burning chest pain, abdominal pain and vomiting.  The patient is on the cardiac monitor to evaluate for evidence of arrhythmia and/or significant heart rate changes.   DIFFERENTIAL DIAGNOSIS (includes but not limited to):   Volume  overload, CHF exacerbation, ACS, PE, pneumonia, dissection, colitis, diverticulitis, bowel obstruction, UTI, appendicitis, gastritis, GERD, cholelithiasis, cholecystitis, pancreatitis   Patient's presentation is most consistent with acute presentation with potential threat to life or bodily function.   PLAN: Will obtain CBC, CMP, lipase, troponin x 2, urinalysis, chest x-ray, CT of the abdomen pelvis.  EKG does show some slightly worsened T wave changes compared to previous.  Does have a cardiac history.  Will keep on cardiac monitoring.  Will give Protonix, Zofran for symptomatic relief.  Lungs clear to auscultation here with no oxygen requirement.   MEDICATIONS GIVEN IN ED: Medications  nitroGLYCERIN (NITROSTAT) SL tablet 0.4 mg (has no administration in time range)  heparin ADULT infusion 100 units/mL (25000 units/233m) (800 Units/hr Intravenous New Bag/Given 04/19/22 0054)  pantoprazole (PROTONIX) injection 40 mg (40  mg Intravenous Given 04/19/22 0011)  ondansetron (ZOFRAN) injection 4 mg (4 mg Intravenous Given 04/19/22 0012)  aspirin chewable tablet 324 mg (324 mg Oral Given 04/19/22 0029)  iohexol (OMNIPAQUE) 350 MG/ML injection 100 mL (100 mLs Intravenous Contrast Given 04/19/22 0033)  heparin bolus via infusion 3,700 Units (3,700 Units Intravenous Bolus from Bag 04/19/22 0054)     ED COURSE: Patient has normal hemoglobin.  Potassium of 5.4 without EKG changes.  LFTs normal.  Lipase minimally elevated at 56.  Troponin today however significantly elevated at 467 suggestive of an NSTEMI.  Will give aspirin, nitroglycerin, heparin.  BNP elevated 843 but CTs of the chest, abdomen pelvis were reviewed and interpreted by myself and the radiologist they show no pulmonary edema.  No acute pathology in the abdomen.  He does have a small pulmonary embolus without right heart strain which is unlikely the cause of his elevated troponin.  Patient reports feeling better.  Will discuss with hospitalist for  admission for NSTEMI, PE.   CONSULTS:  Consulted and discussed patient's case with hospitalist, Dr. Sidney Ace.  I have recommended admission and consulting physician agrees and will place admission orders.  Patient (and family if present) agree with this plan.   I reviewed all nursing notes, vitals, pertinent previous records.  All labs, EKGs, imaging ordered have been independently reviewed and interpreted by myself.    OUTSIDE RECORDS REVIEWED: Reviewed patient's last cardiology note on 04/05/2022.  Cardiologist recommended stress test and echo at that time to evaluate if this was the cause of his shortness of breath.       FINAL CLINICAL IMPRESSION(S) / ED DIAGNOSES   Final diagnoses:  Shortness of breath  Chest pain, unspecified type  Generalized abdominal pain  NSTEMI (non-ST elevated myocardial infarction) ([place])  Acute pulmonary embolism, unspecified pulmonary embolism type, unspecified whether acute cor pulmonale present ([place])     Rx / DC Orders   ED Discharge Orders     None        Note:  This document was prepared using Dragon voice recognition software and may include unintentional dictation errors.   Layza Summa, Delice Bison, DO 04/19/22 [phone]

## 2022-04-18 NOTE — ED Triage Notes (Signed)
EMS states "called out to home for SOB x 3 weeks. Patient went to dialysis yesterday but did not finish due to cath issue. Patient ambulated to stretcher on scene w no issues. Unable to get good sat due to clubbing of the nails. Diminished lungs sounds. CO2 capnography showing 32-35."

## 2022-04-19 ENCOUNTER — Inpatient Hospital Stay (HOSPITAL_COMMUNITY)
Admit: 2022-04-19 | Discharge: 2022-04-19 | Disposition: A | Discharge status: Home / Self Care | Payer: Medicare Other | Attending: Family Medicine | Admitting: Family Medicine

## 2022-04-19 ENCOUNTER — Emergency Department: Payer: Medicare Other

## 2022-04-19 DIAGNOSIS — I214 Non-ST elevation (NSTEMI) myocardial infarction: Secondary | ICD-10-CM

## 2022-04-19 DIAGNOSIS — I252 Old myocardial infarction: Secondary | ICD-10-CM | POA: Diagnosis not present

## 2022-04-19 DIAGNOSIS — I2699 Other pulmonary embolism without acute cor pulmonale: Secondary | ICD-10-CM | POA: Diagnosis present

## 2022-04-19 DIAGNOSIS — J439 Emphysema, unspecified: Secondary | ICD-10-CM | POA: Diagnosis present

## 2022-04-19 DIAGNOSIS — F17200 Nicotine dependence, unspecified, uncomplicated: Secondary | ICD-10-CM | POA: Insufficient documentation

## 2022-04-19 DIAGNOSIS — E875 Hyperkalemia: Secondary | ICD-10-CM | POA: Diagnosis present

## 2022-04-19 DIAGNOSIS — Z7901 Long term (current) use of anticoagulants: Secondary | ICD-10-CM | POA: Diagnosis not present

## 2022-04-19 DIAGNOSIS — I132 Hypertensive heart and chronic kidney disease with heart failure and with stage 5 chronic kidney disease, or end stage renal disease: Secondary | ICD-10-CM | POA: Diagnosis present

## 2022-04-19 DIAGNOSIS — N186 End stage renal disease: Secondary | ICD-10-CM

## 2022-04-19 DIAGNOSIS — I2510 Atherosclerotic heart disease of native coronary artery without angina pectoris: Secondary | ICD-10-CM | POA: Diagnosis present

## 2022-04-19 DIAGNOSIS — E785 Hyperlipidemia, unspecified: Secondary | ICD-10-CM | POA: Insufficient documentation

## 2022-04-19 DIAGNOSIS — I5042 Chronic combined systolic (congestive) and diastolic (congestive) heart failure: Secondary | ICD-10-CM | POA: Diagnosis present

## 2022-04-19 DIAGNOSIS — Z992 Dependence on renal dialysis: Secondary | ICD-10-CM | POA: Diagnosis not present

## 2022-04-19 DIAGNOSIS — T82838A Hemorrhage of vascular prosthetic devices, implants and grafts, initial encounter: Secondary | ICD-10-CM | POA: Diagnosis present

## 2022-04-19 DIAGNOSIS — D631 Anemia in chronic kidney disease: Secondary | ICD-10-CM | POA: Diagnosis present

## 2022-04-19 DIAGNOSIS — R0602 Shortness of breath: Secondary | ICD-10-CM | POA: Diagnosis present

## 2022-04-19 DIAGNOSIS — Z8249 Family history of ischemic heart disease and other diseases of the circulatory system: Secondary | ICD-10-CM | POA: Diagnosis not present

## 2022-04-19 DIAGNOSIS — I21A1 Myocardial infarction type 2: Secondary | ICD-10-CM | POA: Diagnosis present

## 2022-04-19 DIAGNOSIS — Z8551 Personal history of malignant neoplasm of bladder: Secondary | ICD-10-CM | POA: Diagnosis not present

## 2022-04-19 DIAGNOSIS — I160 Hypertensive urgency: Secondary | ICD-10-CM

## 2022-04-19 DIAGNOSIS — F17210 Nicotine dependence, cigarettes, uncomplicated: Secondary | ICD-10-CM | POA: Diagnosis present

## 2022-04-19 DIAGNOSIS — Z951 Presence of aortocoronary bypass graft: Secondary | ICD-10-CM | POA: Diagnosis not present

## 2022-04-19 DIAGNOSIS — N2581 Secondary hyperparathyroidism of renal origin: Secondary | ICD-10-CM | POA: Diagnosis present

## 2022-04-19 DIAGNOSIS — I700 Atherosclerosis of aorta: Secondary | ICD-10-CM | POA: Diagnosis present

## 2022-04-19 DIAGNOSIS — Z8616 Personal history of COVID-19: Secondary | ICD-10-CM | POA: Diagnosis not present

## 2022-04-19 DIAGNOSIS — I739 Peripheral vascular disease, unspecified: Secondary | ICD-10-CM | POA: Diagnosis present

## 2022-04-19 HISTORY — DX: Non-ST elevation (NSTEMI) myocardial infarction: I21.4

## 2022-04-19 LAB — ECHOCARDIOGRAM COMPLETE
AR max vel: 2.17 cm2
AV Area VTI: 2.03 cm2
AV Area mean vel: 2.1 cm2
AV Mean grad: 6 mmHg
AV Peak grad: 11 mmHg
Ao pk vel: 1.66 m/s
Area-P 1/2: 2.7 cm2
Height: 63 in
MV VTI: 2 cm2
S' Lateral: 2.9 cm
Weight: 2192 oz

## 2022-04-19 LAB — LIPID PANEL
Cholesterol: 147 mg/dL (ref 0–200)
HDL: 43 mg/dL (ref >40)
LDL Cholesterol: 90 mg/dL (ref 0–99)
Total CHOL/HDL Ratio: 3.4 RATIO
Triglycerides: 72 mg/dL (ref <150)
VLDL: 14 mg/dL (ref 0–40)

## 2022-04-19 LAB — COMPREHENSIVE METABOLIC PANEL
ALT: 18 U/L (ref 0–44)
AST: 18 U/L (ref 15–41)
Albumin: 3.8 g/dL (ref 3.5–5.0)
Alkaline Phosphatase: 59 U/L (ref 38–126)
Anion gap: 19 — ABNORMAL HIGH (ref 5–15)
BUN: 62 mg/dL — ABNORMAL HIGH (ref 8–23)
CO2: 22 mmol/L (ref 22–32)
Calcium: 7.1 mg/dL — ABNORMAL LOW (ref 8.9–10.3)
Chloride: 100 mmol/L (ref 98–111)
Creatinine, Ser: 8.2 mg/dL — ABNORMAL HIGH (ref 0.61–1.24)
GFR, Estimated: 7 mL/min — ABNORMAL LOW (ref >60)
Glucose, Bld: 97 mg/dL (ref 70–99)
Potassium: 5.4 mmol/L — ABNORMAL HIGH (ref 3.5–5.1)
Sodium: 141 mmol/L (ref 135–145)
Total Bilirubin: 0.6 mg/dL (ref 0.3–1.2)
Total Protein: 7.2 g/dL (ref 6.5–8.1)

## 2022-04-19 LAB — BASIC METABOLIC PANEL
Anion gap: 17 — ABNORMAL HIGH (ref 5–15)
BUN: 62 mg/dL — ABNORMAL HIGH (ref 8–23)
CO2: 18 mmol/L — ABNORMAL LOW (ref 22–32)
Calcium: 6.6 mg/dL — ABNORMAL LOW (ref 8.9–10.3)
Chloride: 100 mmol/L (ref 98–111)
Creatinine, Ser: 8.3 mg/dL — ABNORMAL HIGH (ref 0.61–1.24)
GFR, Estimated: 7 mL/min — ABNORMAL LOW (ref >60)
Glucose, Bld: 94 mg/dL (ref 70–99)
Potassium: 5.9 mmol/L — ABNORMAL HIGH (ref 3.5–5.1)
Sodium: 135 mmol/L (ref 135–145)

## 2022-04-19 LAB — MRSA NEXT GEN BY PCR, NASAL: MRSA by PCR Next Gen: DETECTED — AB

## 2022-04-19 LAB — CBC
HCT: 37.4 % — ABNORMAL LOW (ref 39.0–52.0)
Hemoglobin: 11.7 g/dL — ABNORMAL LOW (ref 13.0–17.0)
MCH: 29 pg (ref 26.0–34.0)
MCHC: 31.3 g/dL (ref 30.0–36.0)
MCV: 92.6 fL (ref 80.0–100.0)
Platelets: 275 10*3/uL (ref 150–400)
RBC: 4.04 MIL/uL — ABNORMAL LOW (ref 4.22–5.81)
RDW: 17.4 % — ABNORMAL HIGH (ref 11.5–15.5)
WBC: 13.9 10*3/uL — ABNORMAL HIGH (ref 4.0–10.5)
nRBC: 0 % (ref 0.0–0.2)

## 2022-04-19 LAB — TROPONIN I (HIGH SENSITIVITY)
Troponin I (High Sensitivity): 467 ng/L (ref <18)
Troponin I (High Sensitivity): 473 ng/L (ref <18)
Troponin I (High Sensitivity): 684 ng/L (ref <18)

## 2022-04-19 LAB — LIPASE, BLOOD: Lipase: 56 U/L — ABNORMAL HIGH (ref 11–51)

## 2022-04-19 LAB — PROTIME-INR
INR: 1 (ref 0.8–1.2)
Prothrombin Time: 13.4 seconds (ref 11.4–15.2)

## 2022-04-19 LAB — HEPARIN LEVEL (UNFRACTIONATED): Heparin Unfractionated: 0.12 IU/mL — ABNORMAL LOW (ref 0.30–0.70)

## 2022-04-19 LAB — POTASSIUM: Potassium: 3.9 mmol/L (ref 3.5–5.1)

## 2022-04-19 LAB — HEPATITIS B SURFACE ANTIGEN: Hepatitis B Surface Ag: NONREACTIVE

## 2022-04-19 LAB — BRAIN NATRIURETIC PEPTIDE: B Natriuretic Peptide: 843.6 pg/mL — ABNORMAL HIGH (ref 0.0–100.0)

## 2022-04-19 LAB — APTT: aPTT: 29 seconds (ref 24–36)

## 2022-04-19 LAB — HIV ANTIBODY (ROUTINE TESTING W REFLEX): HIV Screen 4th Generation wRfx: NONREACTIVE

## 2022-04-19 MED ORDER — METOPROLOL SUCCINATE ER 25 MG PO TB24
25.0000 mg | ORAL_TABLET | Freq: Every day | ORAL | Status: DC
  Administered 2022-04-19 – 2022-04-21 (×3): 25 mg via ORAL
  Filled 2022-04-19 (×3): qty 1

## 2022-04-19 MED ORDER — SUCROFERRIC OXYHYDROXIDE 500 MG PO CHEW
500.0000 mg | CHEWABLE_TABLET | Freq: Three times a day (TID) | ORAL | Status: DC
  Administered 2022-04-19 – 2022-04-21 (×6): 500 mg via ORAL
  Filled 2022-04-19 (×9): qty 1

## 2022-04-19 MED ORDER — IOHEXOL 300 MG/ML  SOLN: 100.0000 mL | Freq: Once | INTRAMUSCULAR | Status: DC | PRN

## 2022-04-19 MED ORDER — ATORVASTATIN CALCIUM 20 MG PO TABS: 40.0000 mg | ORAL_TABLET | Freq: Every day | ORAL | Status: DC

## 2022-04-19 MED ORDER — NICOTINE 14 MG/24HR TD PT24
14.0000 mg | MEDICATED_PATCH | Freq: Every day | TRANSDERMAL | Status: DC
  Administered 2022-04-19 – 2022-04-21 (×3): 14 mg via TRANSDERMAL
  Filled 2022-04-19 (×3): qty 1

## 2022-04-19 MED ORDER — HEPARIN BOLUS VIA INFUSION
1800.0000 [IU] | Freq: Once | INTRAVENOUS | Status: AC
  Administered 2022-04-19: 1800 [IU] via INTRAVENOUS
  Filled 2022-04-19: qty 1800

## 2022-04-19 MED ORDER — MAGNESIUM HYDROXIDE 400 MG/5ML PO SUSP: 30.0000 mL | Freq: Every day | ORAL | Status: DC | PRN

## 2022-04-19 MED ORDER — AMLODIPINE BESYLATE 5 MG PO TABS: 10.0000 mg | ORAL_TABLET | Freq: Every day | ORAL | Status: DC

## 2022-04-19 MED ORDER — ASPIRIN 300 MG RE SUPP: 300.0000 mg | RECTAL | Status: AC

## 2022-04-19 MED ORDER — MUPIROCIN 2 % EX OINT
1.0000 | TOPICAL_OINTMENT | Freq: Two times a day (BID) | CUTANEOUS | Status: DC
  Administered 2022-04-20 – 2022-04-21 (×4): 1 via NASAL
  Filled 2022-04-19: qty 22

## 2022-04-19 MED ORDER — HYDRALAZINE HCL 20 MG/ML IJ SOLN: 10.0000 mg | Freq: Four times a day (QID) | INTRAMUSCULAR | Status: DC | PRN

## 2022-04-19 MED ORDER — CLOPIDOGREL BISULFATE 75 MG PO TABS
75.0000 mg | ORAL_TABLET | Freq: Every day | ORAL | Status: DC
  Administered 2022-04-19 – 2022-04-21 (×3): 75 mg via ORAL
  Filled 2022-04-19 (×3): qty 1

## 2022-04-19 MED ORDER — ASPIRIN 81 MG PO CHEW
324.0000 mg | CHEWABLE_TABLET | ORAL | Status: AC
  Filled 2022-04-19: qty 4

## 2022-04-19 MED ORDER — PATIROMER SORBITEX CALCIUM 8.4 G PO PACK
8.4000 g | PACK | Freq: Every day | ORAL | Status: DC
  Administered 2022-04-19: 8.4 g via ORAL
  Filled 2022-04-19 (×2): qty 1

## 2022-04-19 MED ORDER — ALTEPLASE 2 MG IJ SOLR: 2.0000 mg | Freq: Once | INTRAMUSCULAR | Status: DC | PRN

## 2022-04-19 MED ORDER — HEPARIN BOLUS VIA INFUSION
3700.0000 [IU] | Freq: Once | INTRAVENOUS | Status: AC
  Administered 2022-04-19: 3700 [IU] via INTRAVENOUS
  Filled 2022-04-19: qty 3700

## 2022-04-19 MED ORDER — HEPARIN (PORCINE) 25000 UT/250ML-% IV SOLN
1600.0000 [IU]/h | INTRAVENOUS | Status: DC
  Administered 2022-04-19: 800 [IU]/h via INTRAVENOUS
  Administered 2022-04-19: 950 [IU]/h via INTRAVENOUS
  Administered 2022-04-20: 1400 [IU]/h via INTRAVENOUS
  Filled 2022-04-19 (×3): qty 250

## 2022-04-19 MED ORDER — ISOSORBIDE DINITRATE 10 MG PO TABS
10.0000 mg | ORAL_TABLET | Freq: Three times a day (TID) | ORAL | Status: DC
  Administered 2022-04-19 – 2022-04-21 (×5): 10 mg via ORAL
  Filled 2022-04-19 (×7): qty 1

## 2022-04-19 MED ORDER — LABETALOL HCL 5 MG/ML IV SOLN
20.0000 mg | INTRAVENOUS | Status: DC | PRN
  Administered 2022-04-19: 20 mg via INTRAVENOUS
  Filled 2022-04-19: qty 4

## 2022-04-19 MED ORDER — ACETAMINOPHEN 325 MG PO TABS: 650.0000 mg | ORAL_TABLET | ORAL | Status: DC | PRN

## 2022-04-19 MED ORDER — VITAMIN D (ERGOCALCIFEROL) 1.25 MG (50000 UNIT) PO CAPS
50000.0000 [IU] | ORAL_CAPSULE | ORAL | Status: DC
  Administered 2022-04-19: 50000 [IU] via ORAL
  Filled 2022-04-19: qty 1

## 2022-04-19 MED ORDER — RENA-VITE PO TABS
1.0000 | ORAL_TABLET | Freq: Every day | ORAL | Status: DC
  Administered 2022-04-19 – 2022-04-20 (×2): 1 via ORAL
  Filled 2022-04-19 (×2): qty 1

## 2022-04-19 MED ORDER — CALCIUM ACETATE (PHOS BINDER) 667 MG PO CAPS
667.0000 mg | ORAL_CAPSULE | Freq: Three times a day (TID) | ORAL | Status: DC
  Administered 2022-04-19 – 2022-04-21 (×6): 667 mg via ORAL
  Filled 2022-04-19 (×8): qty 1

## 2022-04-19 MED ORDER — HYDRALAZINE HCL 25 MG PO TABS
25.0000 mg | ORAL_TABLET | Freq: Three times a day (TID) | ORAL | Status: DC
  Administered 2022-04-19 – 2022-04-21 (×4): 25 mg via ORAL
  Filled 2022-04-19 (×5): qty 1

## 2022-04-19 MED ORDER — NITROGLYCERIN 0.4 MG SL SUBL: 0.4000 mg | SUBLINGUAL_TABLET | SUBLINGUAL | Status: DC | PRN

## 2022-04-19 MED ORDER — TRAZODONE HCL 50 MG PO TABS: 25.0000 mg | ORAL_TABLET | Freq: Every evening | ORAL | Status: DC | PRN

## 2022-04-19 MED ORDER — ASPIRIN 81 MG PO TBEC
81.0000 mg | DELAYED_RELEASE_TABLET | Freq: Every day | ORAL | Status: DC
  Administered 2022-04-20 – 2022-04-21 (×2): 81 mg via ORAL
  Filled 2022-04-19 (×2): qty 1

## 2022-04-19 MED ORDER — CALCIUM GLUCONATE-NACL 1-0.675 GM/50ML-% IV SOLN
1.0000 g | Freq: Once | INTRAVENOUS | Status: AC
  Administered 2022-04-19: 1000 mg via INTRAVENOUS
  Filled 2022-04-19: qty 50

## 2022-04-19 MED ORDER — CHLORHEXIDINE GLUCONATE CLOTH 2 % EX PADS
6.0000 | MEDICATED_PAD | Freq: Every day | CUTANEOUS | Status: DC
  Filled 2022-04-19 (×2): qty 6

## 2022-04-19 MED ORDER — ASPIRIN 81 MG PO TBEC: 81.0000 mg | DELAYED_RELEASE_TABLET | Freq: Every day | ORAL | Status: DC

## 2022-04-19 MED ORDER — SODIUM CHLORIDE 0.9 % IV SOLN: INTRAVENOUS | Status: DC

## 2022-04-19 MED ORDER — HEPARIN SODIUM (PORCINE) 1000 UNIT/ML IJ SOLN
INTRAMUSCULAR | Status: AC
  Filled 2022-04-19: qty 4

## 2022-04-19 MED ORDER — CALCITRIOL 0.25 MCG PO CAPS
0.5000 ug | ORAL_CAPSULE | Freq: Every day | ORAL | Status: DC
  Administered 2022-04-19 – 2022-04-21 (×3): 0.5 ug via ORAL
  Filled 2022-04-19 (×3): qty 2

## 2022-04-19 MED ORDER — ATORVASTATIN CALCIUM 80 MG PO TABS
80.0000 mg | ORAL_TABLET | Freq: Every day | ORAL | Status: DC
  Administered 2022-04-19 – 2022-04-21 (×3): 80 mg via ORAL
  Filled 2022-04-19 (×2): qty 1
  Filled 2022-04-19 (×2): qty 4

## 2022-04-19 MED ORDER — PANTOPRAZOLE SODIUM 40 MG PO TBEC
40.0000 mg | DELAYED_RELEASE_TABLET | Freq: Every day | ORAL | Status: DC
  Administered 2022-04-19 – 2022-04-20 (×2): 40 mg via ORAL
  Filled 2022-04-19 (×2): qty 1

## 2022-04-19 MED ORDER — ALPRAZOLAM 0.25 MG PO TABS: 0.2500 mg | ORAL_TABLET | Freq: Two times a day (BID) | ORAL | Status: DC | PRN

## 2022-04-19 MED ORDER — ASPIRIN 81 MG PO CHEW
324.0000 mg | CHEWABLE_TABLET | Freq: Once | ORAL | Status: AC
  Administered 2022-04-19: 324 mg via ORAL
  Filled 2022-04-19: qty 4

## 2022-04-19 MED ORDER — CLONIDINE HCL 0.1 MG PO TABS: 0.2000 mg | ORAL_TABLET | Freq: Two times a day (BID) | ORAL | Status: DC

## 2022-04-19 MED ORDER — HEPARIN SODIUM (PORCINE) 1000 UNIT/ML DIALYSIS
1000.0000 [IU] | INTRAMUSCULAR | Status: DC | PRN
  Administered 2022-04-19: 1000 [IU]
  Filled 2022-04-19: qty 1

## 2022-04-19 MED ORDER — ONDANSETRON HCL 4 MG/2ML IJ SOLN: 4.0000 mg | Freq: Four times a day (QID) | INTRAMUSCULAR | Status: DC | PRN

## 2022-04-19 MED ORDER — IOHEXOL 350 MG/ML SOLN
100.0000 mL | Freq: Once | INTRAVENOUS | Status: AC | PRN
  Administered 2022-04-19: 100 mL via INTRAVENOUS

## 2022-04-19 MED ORDER — SACUBITRIL-VALSARTAN 24-26 MG PO TABS
1.0000 | ORAL_TABLET | Freq: Two times a day (BID) | ORAL | Status: DC
  Administered 2022-04-19 – 2022-04-21 (×5): 1 via ORAL
  Filled 2022-04-19 (×5): qty 1

## 2022-04-19 MED ORDER — CHLORHEXIDINE GLUCONATE CLOTH 2 % EX PADS
6.0000 | MEDICATED_PAD | Freq: Every day | CUTANEOUS | Status: DC
  Administered 2022-04-20 – 2022-04-21 (×2): 6 via TOPICAL

## 2022-04-19 NOTE — ED Notes (Signed)
MD made aware of critical troponin result called from lab.

## 2022-04-19 NOTE — Assessment & Plan Note (Addendum)
Hyperkalemia - POA, resolved after HD. Nephrology following for hemodialysis. Underwent dialysis 2/25, 2/26 Monitor BMP

## 2022-04-19 NOTE — Progress Notes (Signed)
[place] for heparin infusion Indication: ACS/STEMI  No Known Allergies  Patient Measurements: Height: '5\' 3"'$  (160 cm) Weight: 62.1 kg (137 lb) IBW/kg (Calculated) : 56.9 Heparin Dosing Weight: 62.1 kg  Vital Signs: Temp: 98.5 F (36.9 C) (02/26 1238) Temp Source: Oral (02/26 1238) BP: 129/95 (02/26 1238) Pulse Rate: 75 (02/26 1238)  Labs: Recent Labs    04/18/22 0032 04/18/22 2337 04/19/22 0132 04/19/22 0409 04/19/22 1346  HGB  --  12.2*  --  11.7*  --   HCT  --  40.2  --  37.4*  --   PLT  --  268  --  275  --   APTT 29  --   --   --   --   LABPROT 13.4  --   --   --   --   INR 1.0  --   --   --   --   HEPARINUNFRC  --   --   --   --  0.12*  CREATININE  --  8.20*  --  8.30*  --   TROPONINIHS  --  467* 473*  --  684*     Estimated Creatinine Clearance: 7.2 mL/min (A) (by C-G formula based on SCr of 8.3 mg/dL (H)).   Medical History: Past Medical History:  Diagnosis Date   Aortic atherosclerosis ([place])    Bilateral carotid artery disease ([place])    Bladder cancer ([place])    Coronary artery disease 12/20/2018   a.) LHC 12/20/2018: 50% OM1, 40% OM2, 95% o-pLAD, 75% o=pLCx, 40% mLM, 70% D1, 60% mRCA-1, 50% mRCA-2; refer to CVTS. b.) [facility]v CABG at [facility] on 12/27/2018: LIMA-LAD, RIMA-PDA, seg LRA-OM1-D1   DCM (dilated cardiomyopathy) ([place]) 12/05/2018   a.) TTE 12/05/2018: EF 40-45%. b.) TTE 12/28/2019: EF 20-25%.   ESRD (end stage renal disease) ([place])    a.) T-Th-Sat   HFrEF (heart failure with reduced ejection fraction) ([place]) 12/05/2018   a.) TTE 12/05/2018: EF 40-45%; mild LVH; ant/apical/sep HK; mild TR . b.) TTE 12/28/2019: EF 20-25%; mod LVH; mod MR/AR; G1DD.   History of 2019 novel coronavirus disease (COVID-19) 04/08/2021   History of kidney stones    HLD (hyperlipidemia)    Hypertension    Infrarenal abdominal aortic aneurysm (AAA) without rupture ([place]) 03/05/2021   a.) CT abd/pelvis; measured 3.2 cm.   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 PMH includes CAD s/p CABG, chronic HFrEF (EF 20-25%), ESRD HD T/Th/Sat.  Goal of Therapy:  Heparin level 0.3-0.7 units/ml Monitor platelets by anticoagulation protocol: Yes  Plan: heparin level 0.12 (subtherapeutic) Bolus 1800 units x 1 Increase heparin infusion to 950 units/hr Will check HL in 8 hr after start of infusion CBC daily while on heparin   Glean Salvo, PharmD, BCPS Clinical Pharmacist  04/19/2022 2:24 PM

## 2022-04-19 NOTE — Progress Notes (Signed)
Called wife to give her an update. She said she is so frustrated by her husband and told me that he is completely non-compliant with everything, spends money on donuts and cigarettes instead of gas money to get to his dialysis. When she confronts him about his issues and his response is, "I don't care, I'll just go to the hospital if I get bad enough."

## 2022-04-19 NOTE — Progress Notes (Signed)
Received patient in bed to unit.  Alert and oriented.  Informed consent signed and in chart.   TX duration: 3.5  Patient tolerated well.  Transported back to the room  Alert, without acute distress.  Hand-off given to patient's nurse.   Access used: CVC Access issues: None  Total UF removed: 2000 ml Medication(s) given: none  Post HD VS: stable Post HD weight: Unable to weigh   [facility]

## 2022-04-19 NOTE — ED Notes (Signed)
Report given to dialysis center. Consent signed by pt.

## 2022-04-19 NOTE — Discharge Planning (Signed)
[facility] [address], [place] [address] [phone]  Scheduled days: Tuesday Thursday and Saturday  Treatment time: 6:00am

## 2022-04-19 NOTE — Assessment & Plan Note (Signed)
Smokes 1/2 ppd. Nicotine patch ordered.

## 2022-04-19 NOTE — Assessment & Plan Note (Addendum)
Presented with chest discomfort.  Found to have small LLL acute PE without evidence of right heart strain.  Hemodynamically stable.  No hypoxia. Chest pain has resolved. --Continue IV heparin --Follow up BLE doppler U/S --Vascular consulted --Already on DAPT with ASA/Plavix --Will transition to [place] at discharge.  Defer to vascular regarding antiplatelets, but likely d/c on Eliquis and ASA unless strong indication for Plavix or triple therapy

## 2022-04-19 NOTE — Progress Notes (Signed)
*  PRELIMINARY RESULTS* Echocardiogram 2D Echocardiogram has been performed.  Anthony Hess 04/19/2022, 3:12 PM

## 2022-04-19 NOTE — Progress Notes (Signed)
[facility] [place] for heparin infusion Indication: ACS/STEMI  No Known Allergies  Patient Measurements: Height: '5\' 3"'$  (160 cm) Weight: 62.1 kg (137 lb) IBW/kg (Calculated) : 56.9 Heparin Dosing Weight: 62.1 kg  Vital Signs: Temp: 97.9 F (36.6 C) (02/25 2335) Temp Source: Oral (02/25 2335) BP: 165/113 (02/25 2330) Pulse Rate: 77 (02/25 2330)  Labs: Recent Labs    04/18/22 2337  HGB 12.2*  HCT 40.2  PLT 268  CREATININE 8.20*  TROPONINIHS 467*    Estimated Creatinine Clearance: 7.3 mL/min (A) (by C-G formula based on SCr of 8.2 mg/dL (H)).   Medical History: Past Medical History:  Diagnosis Date   Aortic atherosclerosis ([place])    Bilateral carotid artery disease ([place])    Bladder cancer ([place])    Coronary artery disease 12/20/2018   a.) LHC 12/20/2018: 50% OM1, 40% OM2, 95% o-pLAD, 75% o=pLCx, 40% mLM, 70% D1, 60% mRCA-1, 50% mRCA-2; refer to CVTS. b.) 4v CABG at [facility] on 12/27/2018: LIMA-LAD, RIMA-PDA, seg LRA-OM1-D1   DCM (dilated cardiomyopathy) ([place]) 12/05/2018   a.) TTE 12/05/2018: EF 40-45%. b.) TTE 12/28/2019: EF 20-25%.   ESRD (end stage renal disease) ([place])    a.) T-Th-Sat   HFrEF (heart failure with reduced ejection fraction) ([place]) 12/05/2018   a.) TTE 12/05/2018: EF 40-45%; mild LVH; ant/apical/sep HK; mild TR . b.) TTE 12/28/2019: EF 20-25%; mod LVH; mod MR/AR; G1DD.   History of 2019 novel coronavirus disease (COVID-19) 04/08/2021   History of kidney stones    HLD (hyperlipidemia)    Hypertension    Infrarenal abdominal aortic aneurysm (AAA) without rupture ([place]) 03/05/2021   a.) CT abd/pelvis; measured 3.2 cm.   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  Goal  of Therapy:  Heparin level 0.3-0.7 units/ml Monitor platelets by anticoagulation protocol: Yes   Plan:  Bolus 3700 units x 1 Start heparin infusion at 800 units/hr Will check HL in 8 hr after start of infusion CBC daily while on heparin  Renda Rolls, PharmD, [facility] 04/19/2022 12:30 AM

## 2022-04-19 NOTE — ED Notes (Signed)
MD Mansy made aware of patient's elevated blood pressure

## 2022-04-19 NOTE — Assessment & Plan Note (Addendum)
He will be continued on statin with high-dose and will check fasting lipids.

## 2022-04-19 NOTE — Assessment & Plan Note (Addendum)
Demand ischemia - due to acute PE, not consistent with ACS.  Treated with IV heparin for PE. Troponin trend flat: 467 >> 473 --Stat EKG and troponin in new or recurrent chest pain --On DAPT (ASA/Plavix), Toprol-XL, Lipitor

## 2022-04-19 NOTE — H&P (Addendum)
[place]   PATIENT NAME: Anthony Hess    MR#:  [id number]  DATE OF BIRTH:  24-Apr-1955  DATE OF ADMISSION:  04/18/2022  PRIMARY CARE PHYSICIAN: Patient, No Pcp Per   Patient is coming from: Home  REQUESTING/REFERRING PHYSICIAN: Ward, Delice Bison, DO  CHIEF COMPLAINT:   Chief Complaint  Patient presents with   Shortness of Breath    HISTORY OF PRESENT ILLNESS:  Anthony Hess is a 67 y.o. [place] male with medical history significant for coronary artery disease, status post 4-vessel CABG, end-stage renal disease on hemodialysis on TTS, heart failure with reduced EF, hypertension, dyslipidemia, and peripheral vascular disease, who presented to the emergency room with acute onset of worsening dyspnea over the last 3 weeks as well as chest burning over the last couple weeks which has been intermittent and graded 6/10 in severity in the substernal area worsening with exertion and without radiation however with occasional vomiting without diaphoresis.  He denied any cough however has been having palpitations with his dyspnea and chest pain.  He denies any wheezing.  No hemoptysis or other bleeding diathesis.  He denies any abdominal pain or melena or bright red blood per rectum.  No dysuria, oliguria or hematuria or flank pain.  ED Course: Upon presentation to the emergency room, BP was 165/113 with otherwise normal vital signs.  Labs revealed potassium of 5.4 and a BUN of 62 with creatinine 8.2 anion gap of 19 with CO2 of 22 and serum lipase was 56 with otherwise normal CMP.  High sensitive troponin I was 467 and later 473 and BNP 843.6.  CBC showed hemoglobin 12.2 hematocrit 40.2 with WBC of 11.1. EKG as reviewed by me : Ectopic atrial rhythm with a rate of 75 with left axis deviation and left atrial fascicular block. Imaging: Portable chest x-ray showed aortic atherosclerosis and tortuosity, emphysema with no acute intra thoracic process.  Chest CTA revealed acute pulmonary  embolism with small embolic burden and no CT evidence for right heart strain.  It showed LVH and moderate emphysema.  Abdominal and pelvic CT with contrast came back with no acute intra abdominal or pelvic pathology.  It showed marked bilateral renal atrophy in keeping with chronic renal failure as well as extensive aortoiliac atherosclerotic calcification and aortic atherosclerosis as well as emphysema.  The patient was given IV heparin bolus and drip, 4 baby aspirin, sublingual nitroglycerin, 40 mg of IV Protonix and 4 mg of IV Zofran.  He will be admitted to a progressive unit bed for further evaluation and management. PAST MEDICAL HISTORY:   Past Medical History:  Diagnosis Date   Aortic atherosclerosis ([place])    Bilateral carotid artery disease ([place])    Bladder cancer ([place])    Coronary artery disease 12/20/2018   a.) LHC 12/20/2018: 50% OM1, 40% OM2, 95% o-pLAD, 75% o=pLCx, 40% mLM, 70% D1, 60% mRCA-1, 50% mRCA-2; refer to CVTS. b.) [facility]v [facility]G [facility] on 12/27/2018: LIMA-LAD, RIMA-PDA, seg LRA-OM1-D1   DCM (dilated cardiomyopathy) ([place]) 12/05/2018   a.) TTE 12/05/2018: EF 40-45%. b.) TTE 12/28/2019: EF 20-25%.   ESRD (end stage renal disease) ([place])    a.) T-Th-Sat   HFrEF (heart failure with reduced ejection fraction) ([place]) 12/05/2018   a.) TTE 12/05/2018: EF 40-45%; mild LVH; ant/apical/sep HK; mild TR . b.) TTE 12/28/2019: EF 20-25%; mod LVH; mod MR/AR; G1DD.   History of 2019 novel coronavirus disease (COVID-19) 04/08/2021   History of kidney stones    HLD (  hyperlipidemia)    Hypertension    Infrarenal abdominal aortic aneurysm (AAA) without rupture ([place]) 03/05/2021   a.) CT abd/pelvis; measured 3.2 cm.   Myocardial infarction [facility])    PVD (peripheral vascular disease) ([place])    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   a.) LIMA-LAD, RIMA-PDA, sequential LEFT radial artery to OM1 and D1   Sepsis ([place]) 03/14/2021   Wears glasses     PAST SURGICAL HISTORY:   Past Surgical History:  Procedure  Laterality Date   AV FISTULA PLACEMENT Left 07/30/2021   Procedure: INSERTION OF ARTERIOVENOUS (AV) GORE-TEX GRAFT ARM BRACHIAL ARTERY TO AXILLARY VEIN;  Surgeon: Algernon Huxley, MD;  Location: [facility] ORS;  Service: Vascular;  Laterality: Left;   CAPD INSERTION N/A 12/31/2019   Procedure: LAPAROSCOPIC INSERTION CONTINUOUS AMBULATORY PERITONEAL DIALYSIS  (CAPD) CATHETER;  Surgeon: Jules Husbands, MD;  Location: [facility];  Service: General;  Laterality: N/A;   CAPD REMOVAL N/A 04/10/2020   Procedure: LAPAROSCOPIC REVISION OF CONTINUOUS AMBULATORY PERITONEAL DIALYSIS  (CAPD) CATHETER;  Surgeon: Jules Husbands, MD;  Location: [facility];  Service: General;  Laterality: N/A;   CORONARY ARTERY BYPASS GRAFT N/A 12/27/2018   Procedure: CORONARY ARTERY BYPASS GRAFTING (CABG) X 4 ON PUMP USING RIGHT & LEFT INTERNAL MAMMARY ARTERY LEFT RADIAL ARTERY ENDOSCOPICALLY HARVESTED;  Surgeon: [name], MD;  Location: [place];  Service: Open Heart Surgery;  Laterality: N/A;   CYSTOSCOPY W/ RETROGRADES Bilateral 05/15/2019   Procedure: CYSTOSCOPY WITH RETROGRADE PYELOGRAM;  Surgeon: Abbie Sons, MD;  Location: [facility];  Service: Urology;  Laterality: Bilateral;   CYSTOSCOPY WITH BIOPSY N/A 05/15/2019   Procedure: CYSTOSCOPY WITH bladder BIOPSY;  Surgeon: Abbie Sons, MD;  Location: [facility];  Service: Urology;  Laterality: N/A;   DIALYSIS/PERMA CATHETER INSERTION N/A 12/28/2019   Procedure: DIALYSIS/PERMA CATHETER INSERTION;  Surgeon: Algernon Huxley, MD;  Location: [place] CV LAB;  Service: Cardiovascular;  Laterality: N/A;   DIALYSIS/PERMA CATHETER INSERTION N/A 03/18/2021   Procedure: DIALYSIS/PERMA CATHETER INSERTION;  Surgeon: Algernon Huxley, MD;  Location: [place] [facility];  Service: Cardiovascular;  Laterality: N/A;   DIALYSIS/PERMA CATHETER REMOVAL N/A 06/02/2020   Procedure: DIALYSIS/PERMA CATHETER REMOVAL;  Surgeon: Algernon Huxley, MD;  Location: [place] [facility];  Service: Cardiovascular;   Laterality: N/A;   EXCHANGE OF A DIALYSIS CATHETER Right 04/10/2020   Procedure: EXCHANGE OF A DIALYSIS CATHETER;  Surgeon: Jules Husbands, MD;  Location: [facility];  Service: General;  Laterality: Right;   [place]  01/20/2021   Procedure: HERNIA REPAIR INCISIONAL;  Surgeon: Olean Ree, MD;  Location: [facility];  Service: General;;   IR IMAGE GUIDED DRAINAGE PERCUT CATH  PERITONEAL RETROPERIT  04/07/2020   LAPAROSCOPY N/A 04/16/2021   Procedure: LAPAROSCOPY DIAGNOSTIC;  Surgeon: Benjamine Sprague, DO;  Location: [facility];  Service: General;  Laterality: N/A;   LEFT HEART CATH AND CORONARY ANGIOGRAPHY Left 12/20/2018   Procedure: LEFT HEART CATH AND CORONARY ANGIOGRAPHY;  Surgeon: Isaias Cowman, MD;  Location: [place] [facility];  Service: Cardiovascular;  Laterality: Left;   RADIAL ARTERY HARVEST Left 12/27/2018   Procedure: ENDOSCOPIC RADIAL ARTERY HARVEST;  Surgeon: [name], MD;  Location: [place];  Service: Open Heart Surgery;  Laterality: Left;   REMOVAL OF A DIALYSIS CATHETER Left 03/20/2021   Procedure: REMOVAL OF A PD CATHETER;  Surgeon: Algernon Huxley, MD;  Location: [facility];  Service: Vascular;  Laterality: Left;   REVISION OF ARTERIOVENOUS GORETEX GRAFT  Left 09/11/2021   Procedure: Excisionof infected AV graft;  Surgeon: Katha Cabal, MD;  Location: [facility];  Service: Vascular;  Laterality: Left;   TEE WITHOUT CARDIOVERSION N/A 12/27/2018   Procedure: TRANSESOPHAGEAL ECHOCARDIOGRAM (TEE);  Surgeon: [name], MD;  Location: [place];  Service: Open Heart Surgery;  Laterality: N/A;   TUMOR REMOVAL  2019   Bladder    SOCIAL HISTORY:   Social History   Tobacco Use   Smoking status: Former    Packs/day: 0.25    Types: Cigarettes    Quit date: 02/19/2019    Years since quitting: 3.1   Smokeless tobacco: Never  Substance Use Topics   Alcohol use: Never    FAMILY HISTORY:   Positive for hypertension in his father and coronary artery disease  in his mother.  Both his parents died.  DRUG ALLERGIES:  No Known Allergies  REVIEW OF SYSTEMS:   ROS As per history of present illness. All pertinent systems were reviewed above. Constitutional, HEENT, cardiovascular, respiratory, GI, GU, musculoskeletal, neuro, psychiatric, endocrine, integumentary and hematologic systems were reviewed and are otherwise negative/unremarkable except for positive findings mentioned above in the HPI.   MEDICATIONS AT HOME:   Prior to Admission medications   Medication Sig Start Date End Date Taking? Authorizing Provider  amLODipine (NORVASC) 10 MG tablet Take 1 tablet (10 mg total) by mouth daily. 11/18/21  Yes Emeterio Reeve, DO  aspirin EC 81 MG tablet Take 1 tablet (81 mg total) by mouth daily. Swallow whole. 11/19/21  Yes Emeterio Reeve, DO  atorvastatin (LIPITOR) 80 MG tablet Take 0.5 tablets (40 mg total) by mouth daily. 11/18/21  Yes Emeterio Reeve, DO  calcitRIOL (ROCALTROL) 0.5 MCG capsule Take 1 capsule (0.5 mcg total) by mouth daily. 11/18/21  Yes Emeterio Reeve, DO  clopidogrel (PLAVIX) 75 MG tablet Take 75 mg by mouth daily.   Yes [provider]  multivitamin (RENA-VIT) TABS tablet Take 1 tablet by mouth at bedtime. 03/23/21  Yes Ezekiel Slocumb, DO  sucroferric oxyhydroxide (VELPHORO) 500 MG chewable tablet Chew 1 tablet (500 mg total) by mouth 3 (three) times daily with meals. 11/18/21  Yes Emeterio Reeve, DO  acetaminophen (TYLENOL) 500 MG tablet Take 2 tablets (1,000 mg total) by mouth every 6 (six) hours as needed for mild pain, fever or headache (or Fever >/= 101). 11/18/21   Emeterio Reeve, DO  calcium acetate (PHOSLO) 667 MG capsule Take 1 capsule (667 mg total) by mouth 3 (three) times daily with meals. 11/18/21   Emeterio Reeve, DO  cloNIDine (CATAPRES) 0.2 MG tablet Take 0.2 mg by mouth 2 (two) times daily. 02/13/22   [provider]  losartan (COZAAR) 100 MG tablet Take 100 mg by mouth daily.  12/29/21   [provider]  metoprolol succinate (TOPROL-XL) 25 MG 24 hr tablet Take 25 mg by mouth daily. 03/12/22   [provider]  sacubitril-valsartan (ENTRESTO) 24-26 MG Take 1 tablet by mouth 2 (two) times daily.    [provider]  Vitamin D, Ergocalciferol, (DRISDOL) 1.25 MG (50000 UNIT) CAPS capsule Take 1 capsule (50,000 Units total) by mouth every 7 (seven) days. 11/18/21   Emeterio Reeve, DO      VITAL SIGNS:  Blood pressure (!) 148/103, pulse 69, temperature 98 F (36.7 C), temperature source Oral, resp. rate (!) 25, height '5\' 3"'$  (1.6 m), weight 62.1 kg, SpO2 99 %.  PHYSICAL EXAMINATION:  Physical Exam  GENERAL:  67 y.o.-year-old [place] American male patient lying  in the bed with no acute distress.  EYES: Pupils equal, round, reactive to light and accommodation. No scleral icterus. Extraocular muscles intact.  HEENT: Head atraumatic, normocephalic. Oropharynx and nasopharynx clear.  NECK:  Supple, no jugular venous distention. No thyroid enlargement, no tenderness.  LUNGS: Normal breath sounds bilaterally, no wheezing, rales,rhonchi or crepitation. No use of accessory muscles of respiration.  CARDIOVASCULAR: Regular rate and rhythm, S1, S2 normal. No murmurs, rubs, or gallops.  ABDOMEN: Soft, nondistended, nontender. Bowel sounds present. No organomegaly or mass.  EXTREMITIES: No pedal edema, cyanosis, or clubbing.  NEUROLOGIC: Cranial nerves II through XII are intact. Muscle strength 5/5 in all extremities. Sensation intact. Gait not checked.  PSYCHIATRIC: The patient is alert and oriented x 3.  Normal affect and good eye contact. SKIN: No obvious rash, lesion, or ulcer.   LABORATORY PANEL:   CBC Recent Labs  Lab 04/19/22 0409  WBC 13.9*  HGB 11.7*  HCT 37.4*  PLT 275   ------------------------------------------------------------------------------------------------------------------  Chemistries  Recent Labs  Lab 04/18/22 2337  04/19/22 0409  NA 141 135  K 5.4* 5.9*  CL 100 100  CO2 22 18*  GLUCOSE 97 94  BUN 62* 62*  CREATININE 8.20* 8.30*  CALCIUM 7.1* 6.6*  AST 18  --   ALT 18  --   ALKPHOS 59  --   BILITOT 0.6  --    ------------------------------------------------------------------------------------------------------------------  Cardiac Enzymes No results for input(s): "TROPONINI" in the last 168 hours. ------------------------------------------------------------------------------------------------------------------  RADIOLOGY:  CT ABDOMEN PELVIS W CONTRAST  Result Date: 04/19/2022 CLINICAL DATA:  Abdominal pain, acute, nonlocalized; Pulmonary embolism (PE) suspected, high prob EXAM: CT ANGIOGRAPHY CHEST CT ABDOMEN AND PELVIS WITH CONTRAST TECHNIQUE: Multidetector CT imaging of the chest was performed using the standard protocol during bolus administration of intravenous contrast. Multiplanar CT image reconstructions and MIPs were obtained to evaluate the vascular anatomy. Multidetector CT imaging of the abdomen and pelvis was performed using the standard protocol during bolus administration of intravenous contrast. RADIATION DOSE REDUCTION: This exam was performed according to the departmental dose-optimization program which includes automated exposure control, adjustment of the mA and/or kV according to patient size and/or use of iterative reconstruction technique. CONTRAST:  121m OMNIPAQUE IOHEXOL 350 MG/ML SOLN COMPARISON:  11/16/2021 FINDINGS: CTA CHEST FINDINGS Cardiovascular: There is adequate opacification of the pulmonary arterial tree. A small intraluminal filling defect is identified within the segmental pulmonary arteries of the left lower lobe in keeping with changes of acute pulmonary embolism. The embolic burden is small. The central pulmonary arteries are of normal caliber. No CT evidence of right heart strain. Coronary artery bypass grafting has been performed. Cardiac size is globally within  normal limits though left ventricular hypertrophy is noted. No pericardial effusion. Mild atherosclerotic calcification noted within the thoracic aorta. No aortic aneurysm. Right internal jugular hemodialysis catheter tip noted within the superior right atrium. Mediastinum/Nodes: No enlarged mediastinal, hilar, or axillary lymph nodes. Thyroid gland, trachea, and esophagus demonstrate no significant findings. Lungs/Pleura: Moderate emphysema. No superimposed focal pulmonary infiltrate. No pneumothorax or pleural effusion. No central obstructing lesion. Musculoskeletal: Nonunion of the sternal body noted. No acute bone abnormality. No lytic or blastic bone lesion. Review of the MIP images confirms the above findings. CT ABDOMEN and PELVIS FINDINGS Hepatobiliary: Multiple simple cysts and at least 3 benign cavernous hemangioma are seen within the liver. No suspicious enhancing intrahepatic masses identified. No intra or extrahepatic biliary ductal dilation. Gallbladder unremarkable. Pancreas: Unremarkable. No pancreatic ductal dilatation or surrounding inflammatory changes. Spleen:  Normal in size without focal abnormality. Adrenals/Urinary Tract: The adrenal glands are unremarkable. The kidneys are markedly atrophic bilaterally but are otherwise unremarkable. The bladder is decompressed Stomach/Bowel: Stomach is within normal limits. Appendix appears normal. No evidence of bowel wall thickening, distention, or inflammatory changes. Vascular/Lymphatic: Extensive aortoiliac atherosclerotic calcification. No aortic aneurysm. No pathologic adenopathy within the abdomen and pelvis. Reproductive: Prostate is unremarkable. Other: No abdominal wall hernia or abnormality. No abdominopelvic ascites. Musculoskeletal: No acute bone abnormality. No lytic or blastic bone lesion. Lumbar levoscoliosis noted. Degenerative changes noted at the lumbosacral junction. Review of the MIP images confirms the above findings. IMPRESSION: 1.  Acute pulmonary embolism. The embolic burden is small. No CT evidence of right heart strain. 2. Left ventricular hypertrophy. 3. Moderate emphysema. 4. No acute intra-abdominal pathology identified. 5. Marked bilateral renal atrophy in keeping with chronic renal failure. 6. Extensive aortoiliac atherosclerotic calcification. Aortic Atherosclerosis (ICD10-I70.0) and Emphysema (ICD10-J43.9). Electronically Signed   By: Fidela Salisbury M.D.   On: 04/19/2022 00:57   CT Angio Chest PE W and/or Wo Contrast  Result Date: 04/19/2022 CLINICAL DATA:  Abdominal pain, acute, nonlocalized; Pulmonary embolism (PE) suspected, high prob EXAM: CT ANGIOGRAPHY CHEST CT ABDOMEN AND PELVIS WITH CONTRAST TECHNIQUE: Multidetector CT imaging of the chest was performed using the standard protocol during bolus administration of intravenous contrast. Multiplanar CT image reconstructions and MIPs were obtained to evaluate the vascular anatomy. Multidetector CT imaging of the abdomen and pelvis was performed using the standard protocol during bolus administration of intravenous contrast. RADIATION DOSE REDUCTION: This exam was performed according to the departmental dose-optimization program which includes automated exposure control, adjustment of the mA and/or kV according to patient size and/or use of iterative reconstruction technique. CONTRAST:  11m OMNIPAQUE IOHEXOL 350 MG/ML SOLN COMPARISON:  11/16/2021 FINDINGS: CTA CHEST FINDINGS Cardiovascular: There is adequate opacification of the pulmonary arterial tree. A small intraluminal filling defect is identified within the segmental pulmonary arteries of the left lower lobe in keeping with changes of acute pulmonary embolism. The embolic burden is small. The central pulmonary arteries are of normal caliber. No CT evidence of right heart strain. Coronary artery bypass grafting has been performed. Cardiac size is globally within normal limits though left ventricular hypertrophy is noted.  No pericardial effusion. Mild atherosclerotic calcification noted within the thoracic aorta. No aortic aneurysm. Right internal jugular hemodialysis catheter tip noted within the superior right atrium. Mediastinum/Nodes: No enlarged mediastinal, hilar, or axillary lymph nodes. Thyroid gland, trachea, and esophagus demonstrate no significant findings. Lungs/Pleura: Moderate emphysema. No superimposed focal pulmonary infiltrate. No pneumothorax or pleural effusion. No central obstructing lesion. Musculoskeletal: Nonunion of the sternal body noted. No acute bone abnormality. No lytic or blastic bone lesion. Review of the MIP images confirms the above findings. CT ABDOMEN and PELVIS FINDINGS Hepatobiliary: Multiple simple cysts and at least 3 benign cavernous hemangioma are seen within the liver. No suspicious enhancing intrahepatic masses identified. No intra or extrahepatic biliary ductal dilation. Gallbladder unremarkable. Pancreas: Unremarkable. No pancreatic ductal dilatation or surrounding inflammatory changes. Spleen: Normal in size without focal abnormality. Adrenals/Urinary Tract: The adrenal glands are unremarkable. The kidneys are markedly atrophic bilaterally but are otherwise unremarkable. The bladder is decompressed Stomach/Bowel: Stomach is within normal limits. Appendix appears normal. No evidence of bowel wall thickening, distention, or inflammatory changes. Vascular/Lymphatic: Extensive aortoiliac atherosclerotic calcification. No aortic aneurysm. No pathologic adenopathy within the abdomen and pelvis. Reproductive: Prostate is unremarkable. Other: No abdominal wall hernia or abnormality. No abdominopelvic ascites. Musculoskeletal: No acute  bone abnormality. No lytic or blastic bone lesion. Lumbar levoscoliosis noted. Degenerative changes noted at the lumbosacral junction. Review of the MIP images confirms the above findings. IMPRESSION: 1. Acute pulmonary embolism. The embolic burden is small. No CT  evidence of right heart strain. 2. Left ventricular hypertrophy. 3. Moderate emphysema. 4. No acute intra-abdominal pathology identified. 5. Marked bilateral renal atrophy in keeping with chronic renal failure. 6. Extensive aortoiliac atherosclerotic calcification. Aortic Atherosclerosis (ICD10-I70.0) and Emphysema (ICD10-J43.9). Electronically Signed   By: Fidela Salisbury M.D.   On: 04/19/2022 00:57   DG Chest Portable 1 View  Result Date: 04/19/2022 CLINICAL DATA:  Three weeks history of shortness of breath. EXAM: PORTABLE CHEST 1 VIEW COMPARISON:  CTA chest 11/16/21. 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 No vascular congestion is seen. Right IJ dialysis catheter again noted with tip about the superior cavoatrial junction. Stable mediastinum with aortic tortuosity and calcific plaque. The lungs are emphysematous but clear. No pleural effusion. S shaped thoracolumbar scoliosis. IMPRESSION: 1. No evidence of acute chest process. Emphysema. 2. Aortic atherosclerosis and tortuosity. Electronically Signed   By: Telford Nab M.D.   On: 04/19/2022 00:07      IMPRESSION AND PLAN:  Assessment and Plan: * NSTEMI (non-ST elevated myocardial infarction) [facility]) - The patient will be admitted to a progressive unit bed. - Will follow serial troponins and EKGs. - We will continue IV heparin drip per protocol. - The patient will be continued on aspirin and Plavix as well as placed on p.r.n. sublingual nitroglycerin and morphine sulfate for pain. - Will be placed on high-dose statin and beta-blocker therapy. - We will obtain a 2D echo and cardiology consult. - I notified Dr. Saralyn Pilar about the patient.   Acute pulmonary embolism (HCC) - We will continue IV heparin as mentioned above. - 2D echo will be obtained and should assess RV strain. - Will obtain bilateral lower extremity venous Doppler to rule out DVT.  Hypertensive urgency - We will continue his antihypertensives and place  him on as needed IV labetalol.  End-stage renal disease on hemodialysis [facility]) --Nephrology consult will be obtained. - I notified Dr. Juleen China about the patient. - She stated that he has been compliant with HD but he has been noncompliant in the past. - He has mild hyperkalemia that will be managed with Veltassa and IV calcium gluconate.  Dyslipidemia He will be continued on statin with high-dose and will check fasting lipids.   DVT prophylaxis: IV heparin drip. Advanced Care Planning:  Code Status: full code. Family Communication:  The plan of care was discussed in details with the patient (and family). I answered all questions. The patient agreed to proceed with the above mentioned plan. Further management will depend upon hospital course. Disposition Plan: Back to previous home environment Consults called: Cardiology and nephrology. All the records are reviewed and case discussed with ED provider.  Status is: Inpatient    At the time of the admission, it appears that the appropriate admission status for this patient is inpatient.  This is judged to be reasonable and necessary in order to provide the required intensity of service to ensure the patient's safety given the presenting symptoms, physical exam findings and initial radiographic and laboratory data in the context of comorbid conditions.  The patient requires inpatient status due to high intensity of service, high risk of further deterioration and high frequency of surveillance required.  I certify that at the time of admission, it is my clinical judgment  that the patient will require inpatient hospital care extending more than 2 midnights.                            Dispo: The patient is from: Home              Anticipated d/c is to: Home              Patient currently is not medically stable to d/c.              Difficult to place patient: No  Christel Mormon M.D on 04/19/2022 at 5:52 AM  [facility]   From 7 PM-7  AM, contact night-coverage [email or web address]  CC: Primary care physician; Patient, No Pcp Per

## 2022-04-19 NOTE — Progress Notes (Signed)
[facility]  ROUNDING NOTE   Subjective:   Anthony Hess is a 67 year old male with past medical conditions including hypertension, PVD, dyslipidemia, CAD, four-vessel CABG, and end-stage renal disease on hemodialysis.  Patient presents to the emergency department with complaints of shortness of breath and has been admitted for NSTEMI (non-ST elevated myocardial infarction) [facility]) [I21.4]  Patient is known to our practice from previous admissions and currently receives outpatient dialysis treatments at [facility] on a TTS schedule, supervised by [facility] physicians.  Patient is unsure how much treatment he received on Saturday, however treatment terminated early due to malfunctioning PermCath.  Patient states he has been experiencing progressive shortness of breath over the past few weeks.  Patient at the point he is unable to complete activities of daily living without assistance.  Also reports substernal chest burning.  Has not missed any recent dialysis treatments.  Labs on ED arrival include potassium 5.4, BUN 62, creatinine 8.20 with GFR 7, calcium 7.1, anion gap 19, troponin 467 with BNP 843, elevated WBC 11.1 with hemoglobin 12.2.  Chest x-ray shows emphysema.  CT angio chest with and without contrast shows acute pulmonary embolism, moderate emphysema, and LVH.  We have been consulted to help manage dialysis needs during this admission.  Objective:  Vital signs in last 24 hours:  Temp:  [97.6 F (36.4 C)-98.5 F (36.9 C)] 98.5 F (36.9 C) (02/26 1238) Pulse Rate:  [59-77] 75 (02/26 1238) Resp:  [14-25] 16 (02/26 1238) BP: (124-195)/(77-123) 129/95 (02/26 1238) SpO2:  [97 %-100 %] 97 % (02/26 1238) Weight:  [62.1 kg] 62.1 kg (02/25 2331)  Weight change:  Filed Weights   04/18/22 2331  Weight: 62.1 kg    Intake/Output: No intake/output data recorded.   Intake/Output this shift:  Total I/O In: -  Out: 2000 [Other:2000]  Physical Exam: General: NAD  Head:  Normocephalic, atraumatic. Moist oral mucosal membranes  Eyes: Anicteric  Lungs:  Clear to auscultation, normal effort  Heart: Regular rate and rhythm  Abdomen:  Soft, nontender  Extremities: Trace peripheral edema.  Neurologic: Nonfocal, moving all four extremities  Skin: No lesions  Access: Right PermCath    Basic Metabolic Panel: Recent Labs  Lab 04/18/22 2337 04/19/22 0409  NA 141 135  K 5.4* 5.9*  CL 100 100  CO2 22 18*  GLUCOSE 97 94  BUN 62* 62*  CREATININE 8.20* 8.30*  CALCIUM 7.1* 6.6*    Liver Function Tests: Recent Labs  Lab 04/18/22 2337  AST 18  ALT 18  ALKPHOS 59  BILITOT 0.6  PROT 7.2  ALBUMIN 3.8   Recent Labs  Lab 04/18/22 2337  LIPASE 56*   No results for input(s): "AMMONIA" in the last 168 hours.  CBC: Recent Labs  Lab 04/18/22 2337 04/19/22 0409  WBC 11.1* 13.9*  NEUTROABS 7.4  --   HGB 12.2* 11.7*  HCT 40.2 37.4*  MCV 93.5 92.6  PLT 268 275    Cardiac Enzymes: No results for input(s): "CKTOTAL", "CKMB", "CKMBINDEX", "TROPONINI" in the last 168 hours.  BNP: Invalid input(s): "POCBNP"  CBG: No results for input(s): "GLUCAP" in the last 168 hours.  Microbiology: Results for orders placed or performed during the hospital encounter of 11/16/21  SARS Coronavirus 2 by RT PCR (hospital order, performed in [facility] hospital lab) *cepheid single result test* Anterior Nasal Swab     Status: None   Collection Time: 11/16/21 11:35 AM   Specimen: Anterior Nasal Swab  Result Value Ref Range Status  SARS Coronavirus 2 by RT PCR NEGATIVE NEGATIVE Final    Comment: (NOTE) SARS-CoV-2 target nucleic acids are NOT DETECTED.  The SARS-CoV-2 RNA is generally detectable in upper and lower respiratory specimens during the acute phase of infection. The lowest concentration of SARS-CoV-2 viral copies this assay can detect is 250 copies / mL. A negative result does not preclude SARS-CoV-2 infection and should not be used as the sole basis  for treatment or other patient management decisions.  A negative result may occur with improper specimen collection / handling, submission of specimen other than nasopharyngeal swab, presence of viral mutation(s) within the areas targeted by this assay, and inadequate number of viral copies (<250 copies / mL). A negative result must be combined with clinical observations, patient history, and epidemiological information.  Fact Sheet for Patients:   https://www.patel.info/  Fact Sheet for Healthcare Providers: https://hall.com/  This test is not yet approved or  cleared by the [place] FDA and has been authorized for detection and/or diagnosis of SARS-CoV-2 by FDA under an Emergency Use Authorization (EUA).  This EUA will remain in effect (meaning this test can be used) for the duration of the COVID-19 declaration under Section 564(b)(1) of the Act, 21 U.S.C. section 360bbb-3(b)(1), unless the authorization is terminated or revoked sooner.  Performed at [facility], [address] [place], [place] [address]   MRSA Next Gen by PCR, Nasal     Status: Abnormal   Collection Time: 11/18/21  9:54 AM   Specimen: Nasal Mucosa; Nasal Swab  Result Value Ref Range Status   MRSA by PCR Next Gen DETECTED (A) NOT DETECTED Final    Comment: RESULT CALLED TO, READ BACK BY AND VERIFIED WITH: MIA GARNER 11/18/21 1215 MW (NOTE) The GeneXpert MRSA Assay (FDA approved for NASAL specimens only), is one component of a comprehensive MRSA colonization surveillance program. It is not intended to diagnose MRSA infection nor to guide or monitor treatment for MRSA infections. Test performance is not FDA approved in patients less than 66 years old. Performed at [facility], [address] [place], [place] [address]     Coagulation Studies: Recent Labs    04/18/22 0032  LABPROT 13.4  INR 1.0    Urinalysis: No results for  input(s): "COLORURINE", "LABSPEC", "PHURINE", "GLUCOSEU", "HGBUR", "BILIRUBINUR", "KETONESUR", "PROTEINUR", "UROBILINOGEN", "NITRITE", "LEUKOCYTESUR" in the last 72 hours.  Invalid input(s): "APPERANCEUR"    Imaging: CT ABDOMEN PELVIS W CONTRAST  Result Date: 04/19/2022 CLINICAL DATA:  Abdominal pain, acute, nonlocalized; Pulmonary embolism (PE) suspected, high prob EXAM: CT ANGIOGRAPHY CHEST CT ABDOMEN AND PELVIS WITH CONTRAST TECHNIQUE: Multidetector CT imaging of the chest was performed using the standard protocol during bolus administration of intravenous contrast. Multiplanar CT image reconstructions and MIPs were obtained to evaluate the vascular anatomy. Multidetector CT imaging of the abdomen and pelvis was performed using the standard protocol during bolus administration of intravenous contrast. RADIATION DOSE REDUCTION: This exam was performed according to the departmental dose-optimization program which includes automated exposure control, adjustment of the mA and/or kV according to patient size and/or use of iterative reconstruction technique. CONTRAST:  187m OMNIPAQUE IOHEXOL 350 MG/ML SOLN COMPARISON:  11/16/2021 FINDINGS: CTA CHEST FINDINGS Cardiovascular: There is adequate opacification of the pulmonary arterial tree. A small intraluminal filling defect is identified within the segmental pulmonary arteries of the left lower lobe in keeping with changes of acute pulmonary embolism. The embolic burden is small. The central pulmonary arteries are of normal caliber. No CT evidence of right  heart strain. Coronary artery bypass grafting has been performed. Cardiac size is globally within normal limits though left ventricular hypertrophy is noted. No pericardial effusion. Mild atherosclerotic calcification noted within the thoracic aorta. No aortic aneurysm. Right internal jugular hemodialysis catheter tip noted within the superior right atrium. Mediastinum/Nodes: No enlarged mediastinal, hilar, or  axillary lymph nodes. Thyroid gland, trachea, and esophagus demonstrate no significant findings. Lungs/Pleura: Moderate emphysema. No superimposed focal pulmonary infiltrate. No pneumothorax or pleural effusion. No central obstructing lesion. Musculoskeletal: Nonunion of the sternal body noted. No acute bone abnormality. No lytic or blastic bone lesion. Review of the MIP images confirms the above findings. CT ABDOMEN and PELVIS FINDINGS Hepatobiliary: Multiple simple cysts and at least 3 benign cavernous hemangioma are seen within the liver. No suspicious enhancing intrahepatic masses identified. No intra or extrahepatic biliary ductal dilation. Gallbladder unremarkable. Pancreas: Unremarkable. No pancreatic ductal dilatation or surrounding inflammatory changes. Spleen: Normal in size without focal abnormality. Adrenals/Urinary Tract: The adrenal glands are unremarkable. The kidneys are markedly atrophic bilaterally but are otherwise unremarkable. The bladder is decompressed Stomach/Bowel: Stomach is within normal limits. Appendix appears normal. No evidence of bowel wall thickening, distention, or inflammatory changes. Vascular/Lymphatic: Extensive aortoiliac atherosclerotic calcification. No aortic aneurysm. No pathologic adenopathy within the abdomen and pelvis. Reproductive: Prostate is unremarkable. Other: No abdominal wall hernia or abnormality. No abdominopelvic ascites. Musculoskeletal: No acute bone abnormality. No lytic or blastic bone lesion. Lumbar levoscoliosis noted. Degenerative changes noted at the lumbosacral junction. Review of the MIP images confirms the above findings. IMPRESSION: 1. Acute pulmonary embolism. The embolic burden is small. No CT evidence of right heart strain. 2. Left ventricular hypertrophy. 3. Moderate emphysema. 4. No acute intra-abdominal pathology identified. 5. Marked bilateral renal atrophy in keeping with chronic renal failure. 6. Extensive aortoiliac atherosclerotic  calcification. Aortic Atherosclerosis (ICD10-I70.0) and Emphysema (ICD10-J43.9). Electronically Signed   By: Fidela Salisbury M.D.   On: 04/19/2022 00:57   CT Angio Chest PE W and/or Wo Contrast  Result Date: 04/19/2022 CLINICAL DATA:  Abdominal pain, acute, nonlocalized; Pulmonary embolism (PE) suspected, high prob EXAM: CT ANGIOGRAPHY CHEST CT ABDOMEN AND PELVIS WITH CONTRAST TECHNIQUE: Multidetector CT imaging of the chest was performed using the standard protocol during bolus administration of intravenous contrast. Multiplanar CT image reconstructions and MIPs were obtained to evaluate the vascular anatomy. Multidetector CT imaging of the abdomen and pelvis was performed using the standard protocol during bolus administration of intravenous contrast. RADIATION DOSE REDUCTION: This exam was performed according to the departmental dose-optimization program which includes automated exposure control, adjustment of the mA and/or kV according to patient size and/or use of iterative reconstruction technique. CONTRAST:  152m OMNIPAQUE IOHEXOL 350 MG/ML SOLN COMPARISON:  11/16/2021 FINDINGS: CTA CHEST FINDINGS Cardiovascular: There is adequate opacification of the pulmonary arterial tree. A small intraluminal filling defect is identified within the segmental pulmonary arteries of the left lower lobe in keeping with changes of acute pulmonary embolism. The embolic burden is small. The central pulmonary arteries are of normal caliber. No CT evidence of right heart strain. Coronary artery bypass grafting has been performed. Cardiac size is globally within normal limits though left ventricular hypertrophy is noted. No pericardial effusion. Mild atherosclerotic calcification noted within the thoracic aorta. No aortic aneurysm. Right internal jugular hemodialysis catheter tip noted within the superior right atrium. Mediastinum/Nodes: No enlarged mediastinal, hilar, or axillary lymph nodes. Thyroid gland, trachea, and  esophagus demonstrate no significant findings. Lungs/Pleura: Moderate emphysema. No superimposed focal pulmonary infiltrate. No pneumothorax or pleural  effusion. No central obstructing lesion. Musculoskeletal: Nonunion of the sternal body noted. No acute bone abnormality. No lytic or blastic bone lesion. Review of the MIP images confirms the above findings. CT ABDOMEN and PELVIS FINDINGS Hepatobiliary: Multiple simple cysts and at least 3 benign cavernous hemangioma are seen within the liver. No suspicious enhancing intrahepatic masses identified. No intra or extrahepatic biliary ductal dilation. Gallbladder unremarkable. Pancreas: Unremarkable. No pancreatic ductal dilatation or surrounding inflammatory changes. Spleen: Normal in size without focal abnormality. Adrenals/Urinary Tract: The adrenal glands are unremarkable. The kidneys are markedly atrophic bilaterally but are otherwise unremarkable. The bladder is decompressed Stomach/Bowel: Stomach is within normal limits. Appendix appears normal. No evidence of bowel wall thickening, distention, or inflammatory changes. Vascular/Lymphatic: Extensive aortoiliac atherosclerotic calcification. No aortic aneurysm. No pathologic adenopathy within the abdomen and pelvis. Reproductive: Prostate is unremarkable. Other: No abdominal wall hernia or abnormality. No abdominopelvic ascites. Musculoskeletal: No acute bone abnormality. No lytic or blastic bone lesion. Lumbar levoscoliosis noted. Degenerative changes noted at the lumbosacral junction. Review of the MIP images confirms the above findings. IMPRESSION: 1. Acute pulmonary embolism. The embolic burden is small. No CT evidence of right heart strain. 2. Left ventricular hypertrophy. 3. Moderate emphysema. 4. No acute intra-abdominal pathology identified. 5. Marked bilateral renal atrophy in keeping with chronic renal failure. 6. Extensive aortoiliac atherosclerotic calcification. Aortic Atherosclerosis (ICD10-I70.0) and  Emphysema (ICD10-J43.9). Electronically Signed   By: Fidela Salisbury M.D.   On: 04/19/2022 00:57   DG Chest Portable 1 View  Result Date: 04/19/2022 CLINICAL DATA:  Three weeks history of shortness of breath. EXAM: PORTABLE CHEST 1 VIEW COMPARISON:  CTA chest 11/16/21. 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 No vascular congestion is seen. Right IJ dialysis catheter again noted with tip about the superior cavoatrial junction. Stable mediastinum with aortic tortuosity and calcific plaque. The lungs are emphysematous but clear. No pleural effusion. S shaped thoracolumbar scoliosis. IMPRESSION: 1. No evidence of acute chest process. Emphysema. 2. Aortic atherosclerosis and tortuosity. Electronically Signed   By: Telford Nab M.D.   On: 04/19/2022 00:07     Medications:    heparin 800 Units/hr (04/19/22 0054)    amLODipine  10 mg Oral Daily   aspirin  324 mg Oral NOW   Or   aspirin  300 mg Rectal NOW   [START ON 04/20/2022] aspirin EC  81 mg Oral Daily   atorvastatin  80 mg Oral Daily   calcitRIOL  0.5 mcg Oral Daily   calcium acetate  667 mg Oral TID WC   Chlorhexidine Gluconate Cloth  6 each Topical Q0600   cloNIDine  0.2 mg Oral BID   clopidogrel  75 mg Oral Daily   heparin sodium (porcine)       metoprolol succinate  25 mg Oral Daily   multivitamin  1 tablet Oral QHS   patiromer  8.4 g Oral Daily   sacubitril-valsartan  1 tablet Oral BID   sucroferric oxyhydroxide  500 mg Oral TID WC   Vitamin D (Ergocalciferol)  50,000 Units Oral Q7 days   acetaminophen, ALPRAZolam, alteplase, heparin, heparin sodium (porcine), hydrALAZINE, labetalol, magnesium hydroxide, nitroGLYCERIN, ondansetron (ZOFRAN) IV, traZODone  Assessment/ Plan:  Mr. Aryen Kise is a 67 y.o.  male  Onaje Angon is a 67 year old male with past medical conditions including hypertension, PVD, dyslipidemia, CAD, four-vessel CABG, and end-stage renal disease on hemodialysis.  Patient presents to the  emergency department with complaints of shortness of breath and has been admitted for NSTEMI (  non-ST elevated myocardial infarction) ([place]) [I21.4]  [facility]/TTS/right PermCath.  End-stage renal disease with hyperkalemia on hemodialysis.  Last treatment completed on Saturday, terminated early due to malfunctioning HD access.  Potassium 5.9 today.  Patient will receive dialysis today on 1K bath to manage elevated potassium.  UF goal 2 L as tolerated.  Next treatment scheduled for Tuesday to maintain outpatient schedule.  2. Anemia of chronic kidney disease Lab Results  Component Value Date   HGB 11.7 (L) 04/19/2022    Hemoglobin within desired range.  No need for ESA's at this time.  3. Secondary Hyperparathyroidism: with outpatient labs: PTH 532, phosphorus 10.7, calcium 6.1 on 02/11/22.  :  Lab Results  Component Value Date   PTH 159 (H) 12/27/2019   CALCIUM 6.6 (L) 04/19/2022   CAION 0.84 (LL) 09/11/2021   PHOS 7.4 (H) 11/17/2021    Hypocalcemia/hyperphosphatemia-continue daily calcitriol, weekly ergocalciferol, and calcium acetate with meals.  4.  Hypertension with chronic kidney disease.  Home regimen includes amlodipine, clonidine, metoprolol, Entresto and losartan.  Currently receiving hydralazine, isosorbide, metoprolol, and Entresto.  5.  Acute pulmonary embolism.  Patient currently on heparin drip with echo pending.  Primary team to order bilateral lower extremity Dopplers.   LOS: 0 Olaf Mesa 2/26/202412:48 PM

## 2022-04-19 NOTE — Progress Notes (Addendum)
Brief rounding note, same day as admission  HPI: Anthony Hess is a 67 y.o. male with medical history significant for coronary artery disease, status post 4-vessel CABG, end-stage renal disease on hemodialysis on TTS, heart failure with reduced EF, hypertension, dyslipidemia, and peripheral vascular disease, who presented to the emergency room with acute onset of worsening dyspnea over the last 3 weeks as well as chest discomfort.   See full H&P for detailed HPI on admission.  He was found to have a small LLL acute PE, no right heart strain. Admitted after midnight.   Started on IV heparin.   Interval history: Pt seen in dialysis this AM.  Reports some intermittent ongoing left-sided chest pain. No other acute complaints.  Exam: General exam: awake, alert, no acute distress, chronically ill appearing HEENT: moist mucus membranes, hearing grossly normal  Respiratory system: CTAB, no wheezes, rales or rhonchi, normal respiratory effort. Cardiovascular system: normal S1/S2, RRR, no pedal edema.   Gastrointestinal system: soft, NT, ND, no HSM felt, +bowel sounds. Central nervous system: A&O x 3. no gross focal neurologic deficits, normal speech Extremities: moves all, no edema, normal tone Skin: dry, intact, normal temperature, normal color, No rashes, lesions or ulcers Psychiatry: normal mood, congruent affect, judgement and insight appear normal    A&P: as per H&P by Dr. Sidney Ace, with any changes or additions as below:  --Stop IV fluids in dialysis patient with low EF --Follow pending echo --Repeat potassium level after dialysis      No charge

## 2022-04-19 NOTE — Consult Note (Signed)
[place] NOTE       Patient ID: Anthony Hess MRN: [id number] DOB/AGE: Sep 03, 1955 67 y.o.  Admit date: 04/18/2022 Referring Physician Dr. Eugenie Norrie Primary Physician  Primary Cardiologist Dr. Stan Head Reason for Consultation elevated troponin  HPI: Anthony Hess is a 14yoM with a PMH of CAD s/p CABG x 4 (2020), HFrEF (20-25%, g1DD, mod MR, mi-mod AR 2021), ESRD (T,TH,S HD), PAD, hx tobacco use who presented to [facility] ED 04/18/22 with worsening dyspnea x 3 weeks and abdominal burning with an episode of vomiting the day prior to admission. Troponin elevated (467, 473) and CTA chest positive for LLL PE with small clot burden. Cardiology is consulted because of his elevated troponin.  The patient was previously followed by Dr. Nehemiah Massed and recently seen by Dr. Stan Head in clinic on 04/05/2022 where he was feeling more winded on exertion, having to stop walking after a couple blocks to rest.  Was not having any chest discomfort, orthopnea, PND or lower extremity edema at that time.  Prior to his CABG he had chest pain and dyspnea.  A Lexiscan stress test and echocardiogram were ordered and schedule for 3/4.  He presented to [facility] ED 04/18/2022 with worsening dyspnea on exertion and chest pain he describes as a burning sensation and points to his epigastrum as the location where he feels it the worse. Notes the burning pain occurs sometimes with exertion, sometimes is better after eating, and has to lay down for a while for the discomfort to pass. This is different in character to what he experienced pre-CABG, which was a left sided sharp pain. He reports continued worsening dyspnea on exertion over the past several weeks, limiting his ability to walk around the grocery store or run errands without having to stop and take breaks. He also restarted smoking, 1/2 PPD recently. Denies orthopnea, PND, missed dialysis treatments. Reports occasional abdominal pain and one  episode of vomiting yesterday, no diarrhea, constipation, known sick contracts. At my time of evaluation he is sitting at incline in bed after returning from dialysis. He denies current shortness of breath or chest discomfort. He was given '324mg'$  ASA, started on a heparin infusion, and given SL ntiro, IV protonix, and IV zofran.   Recent vitals notable for BP 145/106, HR in the 60-70s in NSR on tele , comfortable on room air. Labs notable for hyperkalemia 5.9, BUN/Cr 62  8.3 and GFR 7. BNP elevated at 843, and HS trop 467, 473. WBC 13.9, 11.7/37.4. CXR with emphysematous changes, CTA chest with a segmental filling defect in the LLL, without evidence of R heart strain.   Review of systems complete and found to be negative unless listed above     Past Medical History:  Diagnosis Date   Aortic atherosclerosis ([place])    Bilateral carotid artery disease (HCC)    Bladder cancer ([place])    Coronary artery disease 12/20/2018   a.) LHC 12/20/2018: 50% OM1, 40% OM2, 95% o-pLAD, 75% o=pLCx, 40% mLM, 70% D1, 60% mRCA-1, 50% mRCA-2; refer to CVTS. b.) 4v CABG at [facility] on 12/27/2018: LIMA-LAD, RIMA-PDA, seg LRA-OM1-D1   DCM (dilated cardiomyopathy) ([place]) 12/05/2018   a.) TTE 12/05/2018: EF 40-45%. b.) TTE 12/28/2019: EF 20-25%.   ESRD (end stage renal disease) ([place])    a.) T-Th-Sat   HFrEF (heart failure with reduced ejection fraction) ([place]) 12/05/2018   a.) TTE 12/05/2018: EF 40-45%; mild LVH; ant/apical/sep HK; mild TR . b.) TTE 12/28/2019: EF 20-25%; mod LVH; mod  MR/AR; G1DD.   History of 2019 novel coronavirus disease (COVID-19) 04/08/2021   History of kidney stones    HLD (hyperlipidemia)    Hypertension    Infrarenal abdominal aortic aneurysm (AAA) without rupture ([place]) 03/05/2021   a.) CT abd/pelvis; measured 3.2 cm.   Myocardial infarction [facility])    PVD (peripheral vascular disease) ([place])    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   a.) LIMA-LAD, RIMA-PDA, sequential LEFT radial artery to OM1 and D1   Sepsis ([place])  03/14/2021   Wears glasses     Past Surgical History:  Procedure Laterality Date   AV FISTULA PLACEMENT Left 07/30/2021   Procedure: INSERTION OF ARTERIOVENOUS (AV) GORE-TEX GRAFT ARM BRACHIAL ARTERY TO AXILLARY VEIN;  Surgeon: Algernon Huxley, MD;  Location: [facility];  Service: Vascular;  Laterality: Left;   CAPD INSERTION N/A 12/31/2019   Procedure: LAPAROSCOPIC INSERTION CONTINUOUS AMBULATORY PERITONEAL DIALYSIS  (CAPD) CATHETER;  Surgeon: Jules Husbands, MD;  Location: [facility];  Service: General;  Laterality: N/A;   CAPD REMOVAL N/A 04/10/2020   Procedure: LAPAROSCOPIC REVISION OF CONTINUOUS AMBULATORY PERITONEAL DIALYSIS  (CAPD) CATHETER;  Surgeon: Jules Husbands, MD;  Location: [facility];  Service: General;  Laterality: N/A;   CORONARY ARTERY BYPASS GRAFT N/A 12/27/2018   Procedure: CORONARY ARTERY BYPASS GRAFTING (CABG) X 4 ON PUMP USING RIGHT & LEFT INTERNAL MAMMARY ARTERY LEFT RADIAL ARTERY ENDOSCOPICALLY HARVESTED;  Surgeon: [name], MD;  Location: [facility];  Service: Open Heart Surgery;  Laterality: N/A;   CYSTOSCOPY W/ RETROGRADES Bilateral 05/15/2019   Procedure: CYSTOSCOPY WITH RETROGRADE PYELOGRAM;  Surgeon: Abbie Sons, MD;  Location: [facility];  Service: Urology;  Laterality: Bilateral;   CYSTOSCOPY WITH BIOPSY N/A 05/15/2019   Procedure: CYSTOSCOPY WITH bladder BIOPSY;  Surgeon: Abbie Sons, MD;  Location: [facility];  Service: Urology;  Laterality: N/A;   DIALYSIS/PERMA CATHETER INSERTION N/A 12/28/2019   Procedure: DIALYSIS/PERMA CATHETER INSERTION;  Surgeon: Algernon Huxley, MD;  Location: [place] [facility];  Service: Cardiovascular;  Laterality: N/A;   DIALYSIS/PERMA CATHETER INSERTION N/A 03/18/2021   Procedure: DIALYSIS/PERMA CATHETER INSERTION;  Surgeon: Algernon Huxley, MD;  Location: [place] [facility];  Service: Cardiovascular;  Laterality: N/A;   DIALYSIS/PERMA CATHETER REMOVAL N/A 06/02/2020   Procedure: DIALYSIS/PERMA CATHETER REMOVAL;  Surgeon: Algernon Huxley, MD;   Location: [place] [facility];  Service: Cardiovascular;  Laterality: N/A;   EXCHANGE OF A DIALYSIS CATHETER Right 04/10/2020   Procedure: EXCHANGE OF A DIALYSIS CATHETER;  Surgeon: Jules Husbands, MD;  Location: [facility];  Service: General;  Laterality: Right;   [place]  01/20/2021   Procedure: HERNIA REPAIR INCISIONAL;  Surgeon: Olean Ree, MD;  Location: [facility];  Service: General;;   IR IMAGE GUIDED DRAINAGE PERCUT CATH  PERITONEAL RETROPERIT  04/07/2020   LAPAROSCOPY N/A 04/16/2021   Procedure: LAPAROSCOPY DIAGNOSTIC;  Surgeon: Benjamine Sprague, DO;  Location: [facility];  Service: General;  Laterality: N/A;   LEFT HEART CATH AND CORONARY ANGIOGRAPHY Left 12/20/2018   Procedure: LEFT HEART CATH AND CORONARY ANGIOGRAPHY;  Surgeon: Isaias Cowman, MD;  Location: [place] [facility];  Service: Cardiovascular;  Laterality: Left;   RADIAL ARTERY HARVEST Left 12/27/2018   Procedure: ENDOSCOPIC RADIAL ARTERY HARVEST;  Surgeon: [name], MD;  Location: [place];  Service: Open Heart Surgery;  Laterality: Left;   REMOVAL OF A DIALYSIS CATHETER Left 03/20/2021   Procedure: REMOVAL OF A PD CATHETER;  Surgeon: Algernon Huxley, MD;  Location: [facility];  Service: Vascular;  Laterality: Left;   REVISION OF ARTERIOVENOUS GORETEX GRAFT Left 09/11/2021   Procedure: Excisionof infected AV graft;  Surgeon: Katha Cabal, MD;  Location: [facility];  Service: Vascular;  Laterality: Left;   TEE WITHOUT CARDIOVERSION N/A 12/27/2018   Procedure: TRANSESOPHAGEAL ECHOCARDIOGRAM (TEE);  Surgeon: [name], MD;  Location: [place];  Service: Open Heart Surgery;  Laterality: N/A;   TUMOR REMOVAL  2019   Bladder    (Not in a hospital admission)  Social History   Socioeconomic History   Marital status: Married    Spouse name: Not on file   Number of children: Not on file   Years of education: Not on file   Highest education level: Not on file  Occupational History   Not on file   Tobacco Use   Smoking status: Former    Packs/day: 0.25    Types: Cigarettes    Quit date: 02/19/2019    Years since quitting: 3.1   Smokeless tobacco: Never  Vaping Use   Vaping Use: Never used  Substance and Sexual Activity   Alcohol use: Never   Drug use: Never   Sexual activity: Yes  Other Topics Concern   Not on file  Social History Narrative   Not on file   Social Determinants of Health   Financial Resource Strain: Not on file  Food Insecurity: Not on file  Transportation Needs: Not on file  Physical Activity: Not on file  Stress: Not on file  Social Connections: Not on file  Intimate Partner Violence: Not on file    Family History  Family history unknown: Yes     No intake or output data in the 24 hours ending 04/19/22 0820  Vitals:   04/19/22 0645 04/19/22 0700 04/19/22 0715 04/19/22 0730  BP: (!) 142/84 (!) 135/90 (!) 141/79 129/81  Pulse: 62  62 64  Resp: '16 15 17 20  '$ Temp:      TempSrc:      SpO2: 98%  99% 99%  Weight:      Height:        PHYSICAL EXAM General: middle aged thin male, in no acute distress. Laying at incline in bed. HEENT:  Normocephalic and atraumatic. Neck:  No JVD.  Lungs: Normal respiratory effort on room air. Decreased breath sounds, no crackles or wheezing Chest: well healed median sternotomy, reproducible tenderness to palpation along her sternum, L chest wall  Heart: HRRR . Normal S1 and S2 without gallops or murmurs.  Abdomen: Non-distended appearing. General tenderness to palpation of the epigastrium and upper quadrants, no rebound or guarding Msk: Normal strength and tone for age. Extremities: Warm and well perfused. No clubbing, cyanosis. No peripheral edema.  Neuro: Alert and oriented X 3. Psych:  Answers questions appropriately.   Labs: Basic Metabolic Panel: Recent Labs    04/18/22 2337 04/19/22 0409  NA 141 135  K 5.4* 5.9*  CL 100 100  CO2 22 18*  GLUCOSE 97 94  BUN 62* 62*  CREATININE 8.20* 8.30*   CALCIUM 7.1* 6.6*   Liver Function Tests: Recent Labs    04/18/22 2337  AST 18  ALT 18  ALKPHOS 59  BILITOT 0.6  PROT 7.2  ALBUMIN 3.8   Recent Labs    04/18/22 2337  LIPASE 56*   CBC: Recent Labs    04/18/22 2337 04/19/22 0409  WBC 11.1* 13.9*  NEUTROABS 7.4  --   HGB 12.2* 11.7*  HCT  40.2 37.4*  MCV 93.5 92.6  PLT 268 275   Cardiac Enzymes: Recent Labs    04/18/22 2337 04/19/22 0132  TROPONINIHS 467* 473*   BNP: Recent Labs    04/18/22 2237  BNP 843.6*   D-Dimer: No results for input(s): "DDIMER" in the last 72 hours. Hemoglobin A1C: No results for input(s): "HGBA1C" in the last 72 hours. Fasting Lipid Panel: Recent Labs    04/19/22 0409  CHOL 147  HDL 43  LDLCALC 90  TRIG 72  CHOLHDL 3.4   Thyroid Function Tests: No results for input(s): "TSH", "T4TOTAL", "T3FREE", "THYROIDAB" in the last 72 hours.  Invalid input(s): "FREET3" Anemia Panel: No results for input(s): "VITAMINB12", "FOLATE", "FERRITIN", "TIBC", "IRON", "RETICCTPCT" in the last 72 hours.   Radiology: CT ABDOMEN PELVIS W CONTRAST  Result Date: 04/19/2022 CLINICAL DATA:  Abdominal pain, acute, nonlocalized; Pulmonary embolism (PE) suspected, high prob EXAM: CT ANGIOGRAPHY CHEST CT ABDOMEN AND PELVIS WITH CONTRAST TECHNIQUE: Multidetector CT imaging of the chest was performed using the standard protocol during bolus administration of intravenous contrast. Multiplanar CT image reconstructions and MIPs were obtained to evaluate the vascular anatomy. Multidetector CT imaging of the abdomen and pelvis was performed using the standard protocol during bolus administration of intravenous contrast. RADIATION DOSE REDUCTION: This exam was performed according to the departmental dose-optimization program which includes automated exposure control, adjustment of the mA and/or kV according to patient size and/or use of iterative reconstruction technique. CONTRAST:  172m OMNIPAQUE IOHEXOL 350 MG/ML  SOLN COMPARISON:  11/16/2021 FINDINGS: CTA CHEST FINDINGS Cardiovascular: There is adequate opacification of the pulmonary arterial tree. A small intraluminal filling defect is identified within the segmental pulmonary arteries of the left lower lobe in keeping with changes of acute pulmonary embolism. The embolic burden is small. The central pulmonary arteries are of normal caliber. No CT evidence of right heart strain. Coronary artery bypass grafting has been performed. Cardiac size is globally within normal limits though left ventricular hypertrophy is noted. No pericardial effusion. Mild atherosclerotic calcification noted within the thoracic aorta. No aortic aneurysm. Right internal jugular hemodialysis catheter tip noted within the superior right atrium. Mediastinum/Nodes: No enlarged mediastinal, hilar, or axillary lymph nodes. Thyroid gland, trachea, and esophagus demonstrate no significant findings. Lungs/Pleura: Moderate emphysema. No superimposed focal pulmonary infiltrate. No pneumothorax or pleural effusion. No central obstructing lesion. Musculoskeletal: Nonunion of the sternal body noted. No acute bone abnormality. No lytic or blastic bone lesion. Review of the MIP images confirms the above findings. CT ABDOMEN and PELVIS FINDINGS Hepatobiliary: Multiple simple cysts and at least 3 benign cavernous hemangioma are seen within the liver. No suspicious enhancing intrahepatic masses identified. No intra or extrahepatic biliary ductal dilation. Gallbladder unremarkable. Pancreas: Unremarkable. No pancreatic ductal dilatation or surrounding inflammatory changes. Spleen: Normal in size without focal abnormality. Adrenals/Urinary Tract: The adrenal glands are unremarkable. The kidneys are markedly atrophic bilaterally but are otherwise unremarkable. The bladder is decompressed Stomach/Bowel: Stomach is within normal limits. Appendix appears normal. No evidence of bowel wall thickening, distention, or  inflammatory changes. Vascular/Lymphatic: Extensive aortoiliac atherosclerotic calcification. No aortic aneurysm. No pathologic adenopathy within the abdomen and pelvis. Reproductive: Prostate is unremarkable. Other: No abdominal wall hernia or abnormality. No abdominopelvic ascites. Musculoskeletal: No acute bone abnormality. No lytic or blastic bone lesion. Lumbar levoscoliosis noted. Degenerative changes noted at the lumbosacral junction. Review of the MIP images confirms the above findings. IMPRESSION: 1. Acute pulmonary embolism. The embolic burden is small. No CT evidence of right heart strain. 2.  Left ventricular hypertrophy. 3. Moderate emphysema. 4. No acute intra-abdominal pathology identified. 5. Marked bilateral renal atrophy in keeping with chronic renal failure. 6. Extensive aortoiliac atherosclerotic calcification. Aortic Atherosclerosis (ICD10-I70.0) and Emphysema (ICD10-J43.9). Electronically Signed   By: Fidela Salisbury M.D.   On: 04/19/2022 00:57   CT Angio Chest PE W and/or Wo Contrast  Result Date: 04/19/2022 CLINICAL DATA:  Abdominal pain, acute, nonlocalized; Pulmonary embolism (PE) suspected, high prob EXAM: CT ANGIOGRAPHY CHEST CT ABDOMEN AND PELVIS WITH CONTRAST TECHNIQUE: Multidetector CT imaging of the chest was performed using the standard protocol during bolus administration of intravenous contrast. Multiplanar CT image reconstructions and MIPs were obtained to evaluate the vascular anatomy. Multidetector CT imaging of the abdomen and pelvis was performed using the standard protocol during bolus administration of intravenous contrast. RADIATION DOSE REDUCTION: This exam was performed according to the departmental dose-optimization program which includes automated exposure control, adjustment of the mA and/or kV according to patient size and/or use of iterative reconstruction technique. CONTRAST:  148m OMNIPAQUE IOHEXOL 350 MG/ML SOLN COMPARISON:  11/16/2021 FINDINGS: CTA CHEST  FINDINGS Cardiovascular: There is adequate opacification of the pulmonary arterial tree. A small intraluminal filling defect is identified within the segmental pulmonary arteries of the left lower lobe in keeping with changes of acute pulmonary embolism. The embolic burden is small. The central pulmonary arteries are of normal caliber. No CT evidence of right heart strain. Coronary artery bypass grafting has been performed. Cardiac size is globally within normal limits though left ventricular hypertrophy is noted. No pericardial effusion. Mild atherosclerotic calcification noted within the thoracic aorta. No aortic aneurysm. Right internal jugular hemodialysis catheter tip noted within the superior right atrium. Mediastinum/Nodes: No enlarged mediastinal, hilar, or axillary lymph nodes. Thyroid gland, trachea, and esophagus demonstrate no significant findings. Lungs/Pleura: Moderate emphysema. No superimposed focal pulmonary infiltrate. No pneumothorax or pleural effusion. No central obstructing lesion. Musculoskeletal: Nonunion of the sternal body noted. No acute bone abnormality. No lytic or blastic bone lesion. Review of the MIP images confirms the above findings. CT ABDOMEN and PELVIS FINDINGS Hepatobiliary: Multiple simple cysts and at least 3 benign cavernous hemangioma are seen within the liver. No suspicious enhancing intrahepatic masses identified. No intra or extrahepatic biliary ductal dilation. Gallbladder unremarkable. Pancreas: Unremarkable. No pancreatic ductal dilatation or surrounding inflammatory changes. Spleen: Normal in size without focal abnormality. Adrenals/Urinary Tract: The adrenal glands are unremarkable. The kidneys are markedly atrophic bilaterally but are otherwise unremarkable. The bladder is decompressed Stomach/Bowel: Stomach is within normal limits. Appendix appears normal. No evidence of bowel wall thickening, distention, or inflammatory changes. Vascular/Lymphatic: Extensive  aortoiliac atherosclerotic calcification. No aortic aneurysm. No pathologic adenopathy within the abdomen and pelvis. Reproductive: Prostate is unremarkable. Other: No abdominal wall hernia or abnormality. No abdominopelvic ascites. Musculoskeletal: No acute bone abnormality. No lytic or blastic bone lesion. Lumbar levoscoliosis noted. Degenerative changes noted at the lumbosacral junction. Review of the MIP images confirms the above findings. IMPRESSION: 1. Acute pulmonary embolism. The embolic burden is small. No CT evidence of right heart strain. 2. Left ventricular hypertrophy. 3. Moderate emphysema. 4. No acute intra-abdominal pathology identified. 5. Marked bilateral renal atrophy in keeping with chronic renal failure. 6. Extensive aortoiliac atherosclerotic calcification. Aortic Atherosclerosis (ICD10-I70.0) and Emphysema (ICD10-J43.9). Electronically Signed   By: AFidela SalisburyM.D.   On: 04/19/2022 00:57   DG Chest Portable 1 View  Result Date: 04/19/2022 CLINICAL DATA:  Three weeks history of shortness of breath. EXAM: PORTABLE CHEST 1 VIEW COMPARISON:  CTA chest 11/16/21.  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 No vascular congestion is seen. Right IJ dialysis catheter again noted with tip about the superior cavoatrial junction. Stable mediastinum with aortic tortuosity and calcific plaque. The lungs are emphysematous but clear. No pleural effusion. S shaped thoracolumbar scoliosis. IMPRESSION: 1. No evidence of acute chest process. Emphysema. 2. Aortic atherosclerosis and tortuosity. Electronically Signed   By: Telford Nab M.D.   On: 04/19/2022 00:07    ECHO 12/28/2019  1. Left ventricular ejection fraction, by estimation, is 20 to 25%. The  left ventricle has severely decreased function. The left ventricle has no  regional wall motion abnormalities. The left ventricular internal cavity  size was moderately dilated. Left  ventricular diastolic parameters are consistent  with Grade I diastolic  dysfunction (impaired relaxation).   2. Right ventricular systolic function is normal. The right ventricular  size is normal.   3. The mitral valve is normal in structure. Moderate mitral valve  regurgitation. No evidence of mitral stenosis.   4. The aortic valve is normal in structure. Aortic valve regurgitation is  mild to moderate. No aortic stenosis is present.   5. The inferior vena cava is normal in size with greater than 50%  respiratory variability, suggesting right atrial pressure of 3 mmHg.   TELEMETRY reviewed by me (LT) 04/19/2022 : NSR 60s-70s  EKG reviewed by me: NSR 1* AV block, nonspecific ST changes similar to prior from 10/2021  Data reviewed by me (LT) 04/19/2022: ed note, admission H&P nephrology note, last cardiology clinic note, last 24h vitals tele labs imaging I/O    Principal Problem:   NSTEMI (non-ST elevated myocardial infarction) ([place]) Active Problems:   Hypertensive urgency   Acute pulmonary embolism ([place])   Dyslipidemia   End-stage renal disease on hemodialysis ([place])    ASSESSMENT AND PLAN:  Anthony Hess is a [phone] with a PMH of CAD s/p CABG x 4 (2020), HFrEF (20-25%, g1DD, mod MR, mi-mod AR 2021), ESRD (T,TH,S HD), PAD, hx tobacco use who presented to [facility] ED 04/18/22 with worsening dyspnea x 3 weeks and abdominal burning with an episode of vomiting the day prior to admission. Troponin elevated (467, 473) and CTA chest positive for LLL PE with small clot burden. Cardiology is consulted because of his elevated troponin.  # acute LLL PE Seen on CTA chest, small clot burden in LLL. Hemodynamically stable. Started on a heparin infusion  # CAD s/p CABG w/ atypical chest pain # demand ischemia Reports several weeks of worsening dyspnea on exertion + intermittent burning in his epigastrium, occasionally with exertion, sometimes relieved with eating or laying down to rest. Reproducible discomfort to palpation of his sternum + left  chest wall, and epigastrium. Dissimilar to the L sharp discomfort he experienced Pre-CABG. EKG with nonspecific TW abnormality, similar to prior from 10/2021, HS trop elevated but flat trending 467, 473, with a third repeat pending, most consistent with demand/supply mismatch and not ACS  - s/p '325mg'$  aspirin, continue aspirin '81mg'$  & plavix '75mg'$  daily today - continue atorva '80mg'$  daily - continue metoprolol XL '25mg'$  daily.  - echo complete, further recommendations based on results - likely continue management of PE above and complete lexiscan myoview as scheduled outpatient on 3/4.   # chronic HFrEF (20-25%, g1DD, mod MR, mi-mod AR 2021)  Euvolmic on exam, BNP elevated at 843 on admission. Makes a little urine, not on diuretics on non-dialysis days.  - continue GDMT with metoprolol, entresto 24-'26mg'$  daily - start isordil  $'10mg'X$  TID & hydral '25mg'$  TID for afterload reduction - defer MRA, [id number] with renal insufficiency - volume per dialysis    # ESRD on (T, TH, S HD)  S/p treatment today since he received contrast with CTA Chest.   # tobacco abuse Recently restarted smoking 1/2 PPD. Strongly recommended complete cessation. Agreeable to nicotine patches while impatient.    This patient's plan of care was discussed and created with Dr. Clayborn Bigness and he is in agreement.  Signed: Tristan Schroeder , PA-C 04/19/2022, 8:20 AM [facility]

## 2022-04-19 NOTE — Assessment & Plan Note (Addendum)
POA - resolved. BP's have been quite labile.  BP was low in dialysis today. Continue hydralazine, Isordil, Toprol-XL, Entresto

## 2022-04-19 NOTE — Progress Notes (Signed)
Pt just arrived back from dialysis and I assumed care.

## 2022-04-20 ENCOUNTER — Encounter: Payer: Self-pay | Admitting: Family Medicine

## 2022-04-20 ENCOUNTER — Telehealth (HOSPITAL_COMMUNITY): Payer: Self-pay

## 2022-04-20 ENCOUNTER — Other Ambulatory Visit (HOSPITAL_COMMUNITY): Payer: Self-pay

## 2022-04-20 ENCOUNTER — Inpatient Hospital Stay: Payer: Medicare Other

## 2022-04-20 DIAGNOSIS — I214 Non-ST elevation (NSTEMI) myocardial infarction: Secondary | ICD-10-CM | POA: Diagnosis not present

## 2022-04-20 LAB — BASIC METABOLIC PANEL
Anion gap: 15 (ref 5–15)
BUN: 46 mg/dL — ABNORMAL HIGH (ref 8–23)
CO2: 25 mmol/L (ref 22–32)
Calcium: 6.9 mg/dL — ABNORMAL LOW (ref 8.9–10.3)
Chloride: 98 mmol/L (ref 98–111)
Creatinine, Ser: 6.37 mg/dL — ABNORMAL HIGH (ref 0.61–1.24)
GFR, Estimated: 9 mL/min — ABNORMAL LOW (ref >60)
Glucose, Bld: 85 mg/dL (ref 70–99)
Potassium: 4.7 mmol/L (ref 3.5–5.1)
Sodium: 138 mmol/L (ref 135–145)

## 2022-04-20 LAB — CBC
HCT: 38.3 % — ABNORMAL LOW (ref 39.0–52.0)
Hemoglobin: 11.6 g/dL — ABNORMAL LOW (ref 13.0–17.0)
MCH: 28.3 pg (ref 26.0–34.0)
MCHC: 30.3 g/dL (ref 30.0–36.0)
MCV: 93.4 fL (ref 80.0–100.0)
Platelets: 267 10*3/uL (ref 150–400)
RBC: 4.1 MIL/uL — ABNORMAL LOW (ref 4.22–5.81)
RDW: 17.3 % — ABNORMAL HIGH (ref 11.5–15.5)
WBC: 8.8 10*3/uL (ref 4.0–10.5)
nRBC: 0 % (ref 0.0–0.2)

## 2022-04-20 LAB — HEPARIN LEVEL (UNFRACTIONATED)
Heparin Unfractionated: 0.12 IU/mL — ABNORMAL LOW (ref 0.30–0.70)
Heparin Unfractionated: 0.31 IU/mL (ref 0.30–0.70)
Heparin Unfractionated: <0.1 IU/mL — ABNORMAL LOW (ref 0.30–0.70)

## 2022-04-20 LAB — ALBUMIN: Albumin: 3.3 g/dL — ABNORMAL LOW (ref 3.5–5.0)

## 2022-04-20 MED ORDER — HEPARIN BOLUS VIA INFUSION
1850.0000 [IU] | Freq: Once | INTRAVENOUS | Status: AC
  Administered 2022-04-20: 1850 [IU] via INTRAVENOUS
  Filled 2022-04-20: qty 1850

## 2022-04-20 MED ORDER — HEPARIN BOLUS VIA INFUSION
1800.0000 [IU] | Freq: Once | INTRAVENOUS | Status: AC
  Administered 2022-04-20: 1800 [IU] via INTRAVENOUS
  Filled 2022-04-20: qty 1800

## 2022-04-20 NOTE — Progress Notes (Signed)
[place] for heparin infusion Indication: ACS/STEMI  No Known Allergies  Patient Measurements: Height: '5\' 3"'$  (160 cm) Weight: 62.1 kg (137 lb) IBW/kg (Calculated) : 56.9 Heparin Dosing Weight: 62.1 kg  Vital Signs: Temp: 98.4 F (36.9 C) (02/26 2341) Temp Source: Oral (02/26 2341) BP: 85/59 (02/26 2341) Pulse Rate: 77 (02/26 2341)  Labs: Recent Labs    04/18/22 0032 04/18/22 2337 04/19/22 0132 04/19/22 0409 04/19/22 1346 04/20/22 0025  HGB  --  12.2*  --  11.7*  --   --   HCT  --  40.2  --  37.4*  --   --   PLT  --  268  --  275  --   --   APTT 29  --   --   --   --   --   LABPROT 13.4  --   --   --   --   --   INR 1.0  --   --   --   --   --   HEPARINUNFRC  --   --   --   --  0.12* 0.12*  CREATININE  --  8.20*  --  8.30*  --   --   TROPONINIHS  --  467* 473*  --  684*  --      Estimated Creatinine Clearance: 7.2 mL/min (A) (by C-G formula based on SCr of 8.3 mg/dL (H)).   Medical History: Past Medical History:  Diagnosis Date   Aortic atherosclerosis ([place])    Bilateral carotid artery disease ([place])    Bladder cancer ([place])    Coronary artery disease 12/20/2018   a.) LHC 12/20/2018: 50% OM1, 40% OM2, 95% o-pLAD, 75% o=pLCx, 40% mLM, 70% D1, 60% mRCA-1, 50% mRCA-2; refer to CVTS. b.) 4v CABG at [facility] on 12/27/2018: LIMA-LAD, RIMA-PDA, seg LRA-OM1-D1   DCM (dilated cardiomyopathy) ([place]) 12/05/2018   a.) TTE 12/05/2018: EF 40-45%. b.) TTE 12/28/2019: EF 20-25%.   ESRD (end stage renal disease) ([place])    a.) T-Th-Sat   HFrEF (heart failure with reduced ejection fraction) ([place]) 12/05/2018   a.) TTE 12/05/2018: EF 40-45%; mild LVH; ant/apical/sep HK; mild TR . b.) TTE 12/28/2019: EF 20-25%; mod LVH; mod MR/AR; G1DD.   History of 2019 novel coronavirus disease (COVID-19) 04/08/2021   History of kidney stones    HLD (hyperlipidemia)    Hypertension    Infrarenal abdominal aortic aneurysm (AAA) without rupture ([place]) 03/05/2021    a.) CT abd/pelvis; measured 3.2 cm.   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 PMH includes CAD s/p CABG, chronic HFrEF (EF 20-25%), ESRD HD T/Th/Sat.  Goal of Therapy:  Heparin level 0.3-0.7 units/ml Monitor platelets by anticoagulation protocol: Yes  Plan:  2/27:  HL @ 0025 = 0.12, SUBtherapeutic  Will order heparin 1800 units IV X 1 bolus and increase drip rate to 1150 units/hr. Will recheck HL 8 hrs after rate change.  CBC daily while on heparin   Nahom Carfagno D Clinical Pharmacist  04/20/2022 1:00 AM

## 2022-04-20 NOTE — Telephone Encounter (Signed)
Pharmacy Patient Advocate Encounter  Insurance verification completed.    The patient is insured through Medicare Part D   The patient is currently admitted and ran test claims for the following: Eliquis.  Copays and coinsurance results were relayed to Inpatient clinical team.  

## 2022-04-20 NOTE — TOC Benefit Eligibility Note (Signed)
Patient Teacher, English as a foreign language completed.    The patient is currently admitted and upon discharge could be taking Eliqus.  The current 30 day co-pay is $11.20.   The patient is insured through Medicare Part D

## 2022-04-20 NOTE — Progress Notes (Signed)
[facility]  ROUNDING NOTE   Subjective:   Anthony Hess is a 67 year old male with past medical conditions including hypertension, PVD, dyslipidemia, CAD, four-vessel CABG, and end-stage renal disease on hemodialysis.  Patient presents to the emergency department with complaints of shortness of breath and has been admitted for Shortness of breath [R06.02] Generalized abdominal pain [R10.84] NSTEMI (non-ST elevated myocardial infarction) ([place]) [I21.4] Chest pain, unspecified type [R07.9] Acute pulmonary embolism, unspecified pulmonary embolism type, unspecified whether acute cor pulmonale present [facility]) [I26.99]  Patient is known to our practice from previous admissions and currently receives outpatient dialysis treatments at [facility] on a TTS schedule, supervised by [place].    Patient seen and evaluated during dialysis   HEMODIALYSIS FLOWSHEET:  Blood Flow Rate (mL/min): 400 mL/min Arterial Pressure (mmHg): -190 mmHg Venous Pressure (mmHg): 150 mmHg TMP (mmHg): -4 mmHg Ultrafiltration Rate (mL/min): 661 mL/min Dialysate Flow Rate (mL/min): 300 ml/min  Currently sitting up, eating breakfast Tolerating treatment well Room air, denies shortness of breath Cough present, nonproductive  Objective:  Vital signs in last 24 hours:  Temp:  [97.6 F (36.4 C)-98.5 F (36.9 C)] 98.3 F (36.8 C) (02/27 0804) Pulse Rate:  [59-85] 74 (02/27 0900) Resp:  [15-23] 18 (02/27 0900) BP: (85-150)/(55-106) 89/56 (02/27 0900) SpO2:  [94 %-99 %] 94 % (02/27 0900) Weight:  [52.4 kg] 52.4 kg (02/27 0759)  Weight change:  Filed Weights   04/18/22 2331 04/20/22 0759  Weight: 62.1 kg 52.4 kg    Intake/Output: I/O last 3 completed shifts: In: 299.4 [I.V.:299.4] Out: 2200 [Urine:200; Other:2000]   Intake/Output this shift:  No intake/output data recorded.  Physical Exam: General: NAD  Head: Normocephalic, atraumatic. Moist oral mucosal membranes  Eyes: Anicteric   Lungs:  Clear to auscultation, normal effort  Heart: Regular rate and rhythm  Abdomen:  Soft, nontender  Extremities: Trace peripheral edema.  Neurologic: Alert and oriented, moving all four extremities  Skin: No lesions  Access: Right PermCath    Basic Metabolic Panel: Recent Labs  Lab 04/18/22 2337 04/19/22 0409 04/19/22 2139 04/20/22 0807  NA 141 135  --  138  K 5.4* 5.9* 3.9 4.7  CL 100 100  --  98  CO2 22 18*  --  25  GLUCOSE 97 94  --  85  BUN 62* 62*  --  46*  CREATININE 8.20* 8.30*  --  6.37*  CALCIUM 7.1* 6.6*  --  6.9*     Liver Function Tests: Recent Labs  Lab 04/18/22 2337  AST 18  ALT 18  ALKPHOS 59  BILITOT 0.6  PROT 7.2  ALBUMIN 3.8    Recent Labs  Lab 04/18/22 2337  LIPASE 56*    No results for input(s): "AMMONIA" in the last 168 hours.  CBC: Recent Labs  Lab 04/18/22 2337 04/19/22 0409 04/20/22 0807  WBC 11.1* 13.9* 8.8  NEUTROABS 7.4  --   --   HGB 12.2* 11.7* 11.6*  HCT 40.2 37.4* 38.3*  MCV 93.5 92.6 93.4  PLT 268 275 267     Cardiac Enzymes: No results for input(s): "CKTOTAL", "CKMB", "CKMBINDEX", "TROPONINI" in the last 168 hours.  BNP: Invalid input(s): "POCBNP"  CBG: No results for input(s): "GLUCAP" in the last 168 hours.  Microbiology: Results for orders placed or performed during the hospital encounter of 04/18/22  MRSA Next Gen by PCR, Nasal     Status: Abnormal   Collection Time: 04/19/22  1:25 PM   Specimen: Nasal Mucosa; Nasal Swab  Result Value Ref Range Status   MRSA by PCR Next Gen DETECTED (A) NOT DETECTED Final    Comment: RESULT CALLED TO, READ BACK BY AND VERIFIED WITH: JOY BEEMAN2/26/24 MW (NOTE) The GeneXpert MRSA Assay (FDA approved for NASAL specimens only), is one component of a comprehensive MRSA colonization surveillance program. It is not intended to diagnose MRSA infection nor to guide or monitor treatment for MRSA infections. Test performance is not FDA approved in patients less than  45 years old. Performed at [facility], [address] [place],  [id number]     Coagulation Studies: Recent Labs    04/18/22 0032  LABPROT 13.4  INR 1.0     Urinalysis: No results for input(s): "COLORURINE", "LABSPEC", "PHURINE", "GLUCOSEU", "HGBUR", "BILIRUBINUR", "KETONESUR", "PROTEINUR", "UROBILINOGEN", "NITRITE", "LEUKOCYTESUR" in the last 72 hours.  Invalid input(s): "APPERANCEUR"    Imaging: ECHOCARDIOGRAM COMPLETE  Result Date: 04/19/2022    ECHOCARDIOGRAM REPORT   Patient Name:   Anthony Hess Date of Exam: 04/19/2022 Medical Rec #:  [id number]    Height:       63.0 in Accession #:    [id number]   Weight:       137.0 lb Date of Birth:  03-May-1955   BSA:          1.646 m Patient Age:    73 years     BP:           129/81 mmHg Patient Gender: M            HR:           71 bpm. Exam Location:  [facility] Procedure: 2D Echo, Cardiac Doppler and Color Doppler Indications:     NSTEMI  History:         Patient has prior history of Echocardiogram examinations, most                  recent 12/28/2019. CHF, Acute MI, Previous Myocardial Infarction                  and CAD, Prior CABG, Arrythmias:Atrial Fibrillation; Risk                  Factors:Hypertension, Current Smoker and Dyslipidemia.                  Pulmonary embolus, AAA.  Sonographer:     Wenda Low Referring Phys:  [id number] JAN A MANSY Diagnosing Phys: Kate Sable MD IMPRESSIONS  1. Left ventricular ejection fraction, by estimation, is 55 to 60%. The left ventricle has normal function. The left ventricle has no regional wall motion abnormalities. There is moderate concentric left ventricular hypertrophy. Left ventricular diastolic parameters are consistent with Grade I diastolic dysfunction (impaired relaxation).  2. Right ventricular systolic function is normal. The right ventricular size is normal. There is normal pulmonary artery systolic pressure.  3. The mitral valve is normal in structure. No evidence of  mitral valve regurgitation.  4. The aortic valve is tricuspid. Aortic valve regurgitation is not visualized. Aortic valve sclerosis/calcification is present, without any evidence of aortic stenosis.  5. The inferior vena cava is normal in size with greater than 50% respiratory variability, suggesting right atrial pressure of 3 mmHg. FINDINGS  Left Ventricle: Left ventricular ejection fraction, by estimation, is 55 to 60%. The left ventricle has normal function. The left ventricle has no regional wall motion abnormalities. The left ventricular internal cavity size was normal in size. There is  moderate concentric left  ventricular hypertrophy. Left ventricular diastolic parameters are consistent with Grade I diastolic dysfunction (impaired relaxation). Right Ventricle: The right ventricular size is normal. No increase in right ventricular wall thickness. Right ventricular systolic function is normal. There is normal pulmonary artery systolic pressure. The tricuspid regurgitant velocity is 2.18 m/s, and  with an assumed right atrial pressure of 3 mmHg, the estimated right ventricular systolic pressure is [id number] mmHg. Left Atrium: Left atrial size was normal in size. Right Atrium: Right atrial size was normal in size. Pericardium: There is no evidence of pericardial effusion. Mitral Valve: The mitral valve is normal in structure. No evidence of mitral valve regurgitation. MV peak gradient, 6.4 mmHg. The mean mitral valve gradient is 2.0 mmHg. Tricuspid Valve: The tricuspid valve is normal in structure. Tricuspid valve regurgitation is trivial. Aortic Valve: The aortic valve is tricuspid. Aortic valve regurgitation is not visualized. Aortic valve sclerosis/calcification is present, without any evidence of aortic stenosis. Aortic valve mean gradient measures 6.0 mmHg. Aortic valve peak gradient measures 11.0 mmHg. Aortic valve area, by VTI measures 2.03 cm. Pulmonic Valve: The pulmonic valve was normal in structure.  Pulmonic valve regurgitation is not visualized. Aorta: The aortic root is normal in size and structure. Venous: The inferior vena cava is normal in size with greater than 50% respiratory variability, suggesting right atrial pressure of 3 mmHg. IAS/Shunts: No atrial level shunt detected by color flow Doppler.  LEFT VENTRICLE PLAX 2D LVIDd:         3.80 cm   Diastology LVIDs:         2.90 cm   LV e' medial:    5.44 cm/s LV PW:         1.70 cm   LV E/e' medial:  9.3 LV IVS:        1.40 cm   LV e' lateral:   12.20 cm/s LVOT diam:     1.90 cm   LV E/e' lateral: 4.2 LV SV:         62 LV SV Index:   38 LVOT Area:     2.84 cm  RIGHT VENTRICLE RV Basal diam:  4.10 cm RV Mid diam:    3.60 cm RV S prime:     6.96 cm/s LEFT ATRIUM             Index        RIGHT ATRIUM           Index LA diam:        3.90 cm 2.37 cm/m   RA Area:     14.60 cm LA Vol (A2C):   62.0 ml 37.66 ml/m  RA Volume:   30.80 ml  18.71 ml/m LA Vol (A4C):   31.5 ml 19.13 ml/m LA Biplane Vol: 44.0 ml 26.72 ml/m  AORTIC VALVE                     PULMONIC VALVE AV Area (Vmax):    2.17 cm      PV Vmax:       1.12 m/s AV Area (Vmean):   2.10 cm      PV Peak grad:  5.0 mmHg AV Area (VTI):     2.03 cm AV Vmax:           166.00 cm/s AV Vmean:          119.000 cm/s AV VTI:            0.307 m AV Peak Grad:  11.0 mmHg AV Mean Grad:      6.0 mmHg LVOT Vmax:         127.00 cm/s LVOT Vmean:        88.000 cm/s LVOT VTI:          0.220 m LVOT/AV VTI ratio: 0.72  AORTA Ao Root diam: 3.60 cm MITRAL VALVE               TRICUSPID VALVE MV Area (PHT): 2.70 cm    TR Peak grad:   19.0 mmHg MV Area VTI:   2.00 cm    TR Vmax:        218.00 cm/s MV Peak grad:  6.4 mmHg MV Mean grad:  2.0 mmHg    SHUNTS MV Vmax:       1.26 m/s    Systemic VTI:  0.22 m MV Vmean:      58.5 cm/s   Systemic Diam: 1.90 cm MV Decel Time: 281 msec MV E velocity: 50.80 cm/s MV A velocity: 68.10 cm/s MV E/A ratio:  0.75 Kate Sable MD Electronically signed by Kate Sable MD Signature  Date/Time: 04/19/2022/4:02:54 PM    Final    CT ABDOMEN PELVIS W CONTRAST  Result Date: 04/19/2022 CLINICAL DATA:  Abdominal pain, acute, nonlocalized; Pulmonary embolism (PE) suspected, high prob EXAM: CT ANGIOGRAPHY CHEST CT ABDOMEN AND PELVIS WITH CONTRAST TECHNIQUE: Multidetector CT imaging of the chest was performed using the standard protocol during bolus administration of intravenous contrast. Multiplanar CT image reconstructions and MIPs were obtained to evaluate the vascular anatomy. Multidetector CT imaging of the abdomen and pelvis was performed using the standard protocol during bolus administration of intravenous contrast. RADIATION DOSE REDUCTION: This exam was performed according to the departmental dose-optimization program which includes automated exposure control, adjustment of the mA and/or kV according to patient size and/or use of iterative reconstruction technique. CONTRAST:  150m OMNIPAQUE IOHEXOL 350 MG/ML SOLN COMPARISON:  11/16/2021 FINDINGS: CTA CHEST FINDINGS Cardiovascular: There is adequate opacification of the pulmonary arterial tree. A small intraluminal filling defect is identified within the segmental pulmonary arteries of the left lower lobe in keeping with changes of acute pulmonary embolism. The embolic burden is small. The central pulmonary arteries are of normal caliber. No CT evidence of right heart strain. Coronary artery bypass grafting has been performed. Cardiac size is globally within normal limits though left ventricular hypertrophy is noted. No pericardial effusion. Mild atherosclerotic calcification noted within the thoracic aorta. No aortic aneurysm. Right internal jugular hemodialysis catheter tip noted within the superior right atrium. Mediastinum/Nodes: No enlarged mediastinal, hilar, or axillary lymph nodes. Thyroid gland, trachea, and esophagus demonstrate no significant findings. Lungs/Pleura: Moderate emphysema. No superimposed focal pulmonary infiltrate.  No pneumothorax or pleural effusion. No central obstructing lesion. Musculoskeletal: Nonunion of the sternal body noted. No acute bone abnormality. No lytic or blastic bone lesion. Review of the MIP images confirms the above findings. CT ABDOMEN and PELVIS FINDINGS Hepatobiliary: Multiple simple cysts and at least 3 benign cavernous hemangioma are seen within the liver. No suspicious enhancing intrahepatic masses identified. No intra or extrahepatic biliary ductal dilation. Gallbladder unremarkable. Pancreas: Unremarkable. No pancreatic ductal dilatation or surrounding inflammatory changes. Spleen: Normal in size without focal abnormality. Adrenals/Urinary Tract: The adrenal glands are unremarkable. The kidneys are markedly atrophic bilaterally but are otherwise unremarkable. The bladder is decompressed Stomach/Bowel: Stomach is within normal limits. Appendix appears normal. No evidence of bowel wall thickening, distention, or inflammatory changes. Vascular/Lymphatic: Extensive aortoiliac atherosclerotic calcification. No aortic aneurysm.  No pathologic adenopathy within the abdomen and pelvis. Reproductive: Prostate is unremarkable. Other: No abdominal wall hernia or abnormality. No abdominopelvic ascites. Musculoskeletal: No acute bone abnormality. No lytic or blastic bone lesion. Lumbar levoscoliosis noted. Degenerative changes noted at the lumbosacral junction. Review of the MIP images confirms the above findings. IMPRESSION: 1. Acute pulmonary embolism. The embolic burden is small. No CT evidence of right heart strain. 2. Left ventricular hypertrophy. 3. Moderate emphysema. 4. No acute intra-abdominal pathology identified. 5. Marked bilateral renal atrophy in keeping with chronic renal failure. 6. Extensive aortoiliac atherosclerotic calcification. Aortic Atherosclerosis (ICD10-I70.0) and Emphysema (ICD10-J43.9). Electronically Signed   By: Fidela Salisbury M.D.   On: 04/19/2022 00:57   CT Angio Chest PE W  and/or Wo Contrast  Result Date: 04/19/2022 CLINICAL DATA:  Abdominal pain, acute, nonlocalized; Pulmonary embolism (PE) suspected, high prob EXAM: CT ANGIOGRAPHY CHEST CT ABDOMEN AND PELVIS WITH CONTRAST TECHNIQUE: Multidetector CT imaging of the chest was performed using the standard protocol during bolus administration of intravenous contrast. Multiplanar CT image reconstructions and MIPs were obtained to evaluate the vascular anatomy. Multidetector CT imaging of the abdomen and pelvis was performed using the standard protocol during bolus administration of intravenous contrast. RADIATION DOSE REDUCTION: This exam was performed according to the departmental dose-optimization program which includes automated exposure control, adjustment of the mA and/or kV according to patient size and/or use of iterative reconstruction technique. CONTRAST:  137m OMNIPAQUE IOHEXOL 350 MG/ML SOLN COMPARISON:  11/16/2021 FINDINGS: CTA CHEST FINDINGS Cardiovascular: There is adequate opacification of the pulmonary arterial tree. A small intraluminal filling defect is identified within the segmental pulmonary arteries of the left lower lobe in keeping with changes of acute pulmonary embolism. The embolic burden is small. The central pulmonary arteries are of normal caliber. No CT evidence of right heart strain. Coronary artery bypass grafting has been performed. Cardiac size is globally within normal limits though left ventricular hypertrophy is noted. No pericardial effusion. Mild atherosclerotic calcification noted within the thoracic aorta. No aortic aneurysm. Right internal jugular hemodialysis catheter tip noted within the superior right atrium. Mediastinum/Nodes: No enlarged mediastinal, hilar, or axillary lymph nodes. Thyroid gland, trachea, and esophagus demonstrate no significant findings. Lungs/Pleura: Moderate emphysema. No superimposed focal pulmonary infiltrate. No pneumothorax or pleural effusion. No central  obstructing lesion. Musculoskeletal: Nonunion of the sternal body noted. No acute bone abnormality. No lytic or blastic bone lesion. Review of the MIP images confirms the above findings. CT ABDOMEN and PELVIS FINDINGS Hepatobiliary: Multiple simple cysts and at least 3 benign cavernous hemangioma are seen within the liver. No suspicious enhancing intrahepatic masses identified. No intra or extrahepatic biliary ductal dilation. Gallbladder unremarkable. Pancreas: Unremarkable. No pancreatic ductal dilatation or surrounding inflammatory changes. Spleen: Normal in size without focal abnormality. Adrenals/Urinary Tract: The adrenal glands are unremarkable. The kidneys are markedly atrophic bilaterally but are otherwise unremarkable. The bladder is decompressed Stomach/Bowel: Stomach is within normal limits. Appendix appears normal. No evidence of bowel wall thickening, distention, or inflammatory changes. Vascular/Lymphatic: Extensive aortoiliac atherosclerotic calcification. No aortic aneurysm. No pathologic adenopathy within the abdomen and pelvis. Reproductive: Prostate is unremarkable. Other: No abdominal wall hernia or abnormality. No abdominopelvic ascites. Musculoskeletal: No acute bone abnormality. No lytic or blastic bone lesion. Lumbar levoscoliosis noted. Degenerative changes noted at the lumbosacral junction. Review of the MIP images confirms the above findings. IMPRESSION: 1. Acute pulmonary embolism. The embolic burden is small. No CT evidence of right heart strain. 2. Left ventricular hypertrophy. 3. Moderate emphysema. 4. No acute  intra-abdominal pathology identified. 5. Marked bilateral renal atrophy in keeping with chronic renal failure. 6. Extensive aortoiliac atherosclerotic calcification. Aortic Atherosclerosis (ICD10-I70.0) and Emphysema (ICD10-J43.9). Electronically Signed   By: Fidela Salisbury M.D.   On: 04/19/2022 00:57   DG Chest Portable 1 View  Result Date: 04/19/2022 CLINICAL DATA:  Three  weeks history of shortness of breath. EXAM: PORTABLE CHEST 1 VIEW COMPARISON:  CTA chest 11/16/21. 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 No vascular congestion is seen. Right IJ dialysis catheter again noted with tip about the superior cavoatrial junction. Stable mediastinum with aortic tortuosity and calcific plaque. The lungs are emphysematous but clear. No pleural effusion. S shaped thoracolumbar scoliosis. IMPRESSION: 1. No evidence of acute chest process. Emphysema. 2. Aortic atherosclerosis and tortuosity. Electronically Signed   By: Telford Nab M.D.   On: 04/19/2022 00:07     Medications:    heparin 1,150 Units/hr (04/20/22 0112)    aspirin EC  81 mg Oral Daily   atorvastatin  80 mg Oral Daily   calcitRIOL  0.5 mcg Oral Daily   calcium acetate  667 mg Oral TID WC   Chlorhexidine Gluconate Cloth  6 each Topical Q0600   clopidogrel  75 mg Oral Daily   hydrALAZINE  25 mg Oral TID   isosorbide dinitrate  10 mg Oral TID   metoprolol succinate  25 mg Oral Daily   multivitamin  1 tablet Oral QHS   mupirocin ointment  1 Application Nasal BID   nicotine  14 mg Transdermal Daily   pantoprazole  40 mg Oral QHS   patiromer  8.4 g Oral Daily   sacubitril-valsartan  1 tablet Oral BID   sucroferric oxyhydroxide  500 mg Oral TID WC   Vitamin D (Ergocalciferol)  50,000 Units Oral Q7 days   acetaminophen, ALPRAZolam, alteplase, heparin, hydrALAZINE, labetalol, magnesium hydroxide, nitroGLYCERIN, ondansetron (ZOFRAN) IV, traZODone  Assessment/ Plan:  Anthony Hess is a 67 y.o.  male  Anthony Hess is a 67 year old male with past medical conditions including hypertension, PVD, dyslipidemia, CAD, four-vessel CABG, and end-stage renal disease on hemodialysis.  Patient presents to the emergency department with complaints of shortness of breath and has been admitted for Shortness of breath [R06.02] Generalized abdominal pain [R10.84] NSTEMI (non-ST elevated myocardial  infarction) ([place]) [I21.4] Chest pain, unspecified type [R07.9] Acute pulmonary embolism, unspecified pulmonary embolism type, unspecified whether acute cor pulmonale present ([place]) [I26.99]  [facility]/TTS/right PermCath.  End-stage renal disease with hyperkalemia on hemodialysis.    Dialysis received yesterday, UF 2L achieved. Receiving treatment today to maintain outpatient, UF reduced to 1L due to hypotension. Next treatment scheduled for Thursday.   2. Anemia of chronic kidney disease Lab Results  Component Value Date   HGB 11.6 (L) 04/20/2022    Hemoglobin acceptable. No need for ESA's at this time.  3. Secondary Hyperparathyroidism: with outpatient labs: PTH 532, phosphorus 10.7, calcium 6.1 on 02/11/22.  :  Lab Results  Component Value Date   PTH 159 (H) 12/27/2019   CALCIUM 6.9 (L) 04/20/2022   CAION 0.84 (LL) 09/11/2021   PHOS 7.4 (H) 11/17/2021    Hypocalcemia/hyperphosphatemia-continue daily calcitriol, weekly ergocalciferol, and calcium acetate with meals.  4.  Hypertension with chronic kidney disease.  Home regimen includes amlodipine, clonidine, metoprolol, Entresto and losartan.  Currently receiving hydralazine, isosorbide, metoprolol, and Entresto.  Blood pressure during dialysis, 107/65  5.  Acute pulmonary embolism.  Patient currently on heparin drip with echo showing EF 55 to 60% with  moderate LVH and grade 1 diastolic dysfunction.   LOS: 1 Anthony Hess 2/27/20249:23 AM

## 2022-04-20 NOTE — Assessment & Plan Note (Signed)
EF recovered.  Echo this admission EF 55-60% (previously 20% in Nov 2021), grade I diastolic dysfunction. --Volume mgmt by dialysis --Continue Entresto, Toprol-XL, hydralazine, Isordil

## 2022-04-20 NOTE — Progress Notes (Signed)
Progress Note   Patient: Bridget Woznick [id number] DOB: 20-Jul-1955 DOA: 04/18/2022     1 DOS: the patient was seen and examined on 04/20/2022   Brief hospital course: Quinten Witting is a 67 y.o. male with medical history significant for coronary artery disease, status post 4-vessel CABG, end-stage renal disease on hemodialysis on TTS, heart failure with reduced EF, hypertension, dyslipidemia, and peripheral vascular disease, who presented to the emergency room with acute onset of worsening dyspnea over the last 3 weeks as well as chest discomfort.    See full H&P for detailed HPI on admission.   He was found to have a small LLL acute PE, no right heart strain. Admitted to the hospital and started on IV heparin.  Assessment and Plan: * NSTEMI (non-ST elevated myocardial infarction) ([place]) Demand ischemia - due to acute PE, not consistent with ACS.  Treated with IV heparin for PE. Troponin trend flat: 467 >> 473 --Stat EKG and troponin in new or recurrent chest pain --On DAPT (ASA/Plavix), Toprol-XL, Lipitor  Acute pulmonary embolism ([place]) Presented with chest discomfort.  Found to have small LLL acute PE without evidence of right heart strain.  Hemodynamically stable.  No hypoxia. Chest pain has resolved. --Continue IV heparin --Follow up BLE doppler U/S --Vascular consulted --Already on DAPT with ASA/Plavix --Will transition to [place] at discharge.  Defer to vascular regarding antiplatelets, but likely d/c on Eliquis and ASA unless strong indication for Plavix or triple therapy  Hypertensive urgency POA - resolved. BP's have been quite labile.  BP was low in dialysis today. Continue hydralazine, Isordil, Toprol-XL, Entresto  Tobacco dependence Smokes 1/2 ppd. Nicotine patch ordered.  End-stage renal disease on hemodialysis ([facility]) Hyperkalemia - POA, resolved after HD. Nephrology following for hemodialysis. Underwent dialysis 2/25, 2/26 Monitor BMP  Dyslipidemia Continue  statin  Chronic combined systolic and diastolic CHF (congestive heart failure) ([place]) EF recovered.  Echo this admission EF 55-60% (previously 20% in Nov 2021), grade I diastolic dysfunction. --Volume mgmt by dialysis --Continue Entresto, Toprol-XL, hydralazine, Isordil        Subjective: Pt seen in dialysis this AM.  He reports chest pain resolved.  Doing well overall, asks about when he might go home.  Requests wife be called with update.  No other acute complaints or acute events reported.   Physical Exam: Vitals:   04/20/22 1130 04/20/22 1156 04/20/22 1200 04/20/22 1220  BP: 103/75 116/88  98/77  Pulse: 70 66  73  Resp: '18 18  14  '$ Temp:  98.5 F (36.9 C)  97.9 F (36.6 C)  TempSrc:  Oral  Oral  SpO2: 97% 97%  95%  Weight:   53.1 kg   Height:       General exam: awake, alert, no acute distress, underweight, chronically ill appearing HEENT: atraumatic, clear conjunctiva, anicteric sclera, moist mucus membranes, hearing grossly normal  Respiratory system: CTAB, no wheezes, rales or rhonchi, normal respiratory effort. Cardiovascular system: normal S1/S2, RRR, no pedal edema.   Central nervous system: A&O x3. no gross focal neurologic deficits, normal speech Extremities: moves all, no edema, normal tone Skin: dry, intact, normal temperature Psychiatry: normal mood, congruent affect, judgement and insight appear normal   Data Reviewed:  Notable labs --- BUN 46, Cr 6.37, GFR 9, Ca 6.9 (add on albumin pending), Hbg stable 11.6  Family Communication: Updated wife [name] by phone this afternoon  Disposition: Status is: Inpatient Remains inpatient appropriate because: On IV heparin, ongoing evaluation and vascular consult. Anticipate discharge tomorrow if  stable   Planned Discharge Destination: Home    Time spent: 38 minutes  Author: Ezekiel Slocumb, DO 04/20/2022 12:40 PM  For on call review [email or web address].

## 2022-04-20 NOTE — Progress Notes (Signed)
[place] NOTE       Patient ID: Anthony Hess MRN: [id number] DOB/AGE: 65-09-1955 65 y.o.  Admit date: 04/18/2022 Referring Physician Dr. Eugenie Norrie Primary Physician  Primary Cardiologist Dr. Stan Head Reason for Consultation elevated troponin  HPI: Drago Mckendrick" Boback is a 89yoM with a PMH of CAD s/p CABG x 4 (2020), HFrEF (20-25%, g1DD, mod MR, mi-mod AR 2021), ESRD (T,TH,S HD), PAD, hx tobacco use who presented to [facility] ED 04/18/22 with worsening dyspnea x 3 weeks and abdominal burning with an episode of vomiting the day prior to admission. Troponin elevated (467, 473) and CTA chest positive for LLL PE with small clot burden. Cardiology is consulted because of his elevated troponin, most c/w demand ischemia in the setting of PE.   Interval History: -seen and examined on 2A after dialysis this AM - no further chest pain, feels as though it it better since starting the heparin infusion. No shortness of breath, orthopnea, on room air - echo with improved EF 55-60%, g1 DD, no rRMAs or valve pathologies  Review of systems complete and found to be negative unless listed above     Past Medical History:  Diagnosis Date   Aortic atherosclerosis ([place])    Bilateral carotid artery disease (HCC)    Bladder cancer ([place])    Coronary artery disease 12/20/2018   a.) LHC 12/20/2018: 50% OM1, 40% OM2, 95% o-pLAD, 75% o=pLCx, 40% mLM, 70% D1, 60% mRCA-1, 50% mRCA-2; refer to CVTS. b.) 4v CABG at [facility] on 12/27/2018: LIMA-LAD, RIMA-PDA, seg LRA-OM1-D1   DCM (dilated cardiomyopathy) ([place]) 12/05/2018   a.) TTE 12/05/2018: EF 40-45%. b.) TTE 12/28/2019: EF 20-25%.   ESRD (end stage renal disease) ([place])    a.) T-Th-Sat   HFrEF (heart failure with reduced ejection fraction) ([place]) 12/05/2018   a.) TTE 12/05/2018: EF 40-45%; mild LVH; ant/apical/sep HK; mild TR . b.) TTE 12/28/2019: EF 20-25%; mod LVH; mod MR/AR; G1DD.   History of 2019 novel coronavirus disease (COVID-19)  04/08/2021   History of kidney stones    HLD (hyperlipidemia)    Hypertension    Infrarenal abdominal aortic aneurysm (AAA) without rupture ([place]) 03/05/2021   a.) CT abd/pelvis; measured 3.2 cm.   Myocardial infarction [facility])    PVD (peripheral vascular disease) ([place])    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   a.) LIMA-LAD, RIMA-PDA, sequential LEFT radial artery to OM1 and D1   Sepsis ([place]) 03/14/2021   Wears glasses     Past Surgical History:  Procedure Laterality Date   AV FISTULA PLACEMENT Left 07/30/2021   Procedure: INSERTION OF ARTERIOVENOUS (AV) GORE-TEX GRAFT ARM BRACHIAL ARTERY TO AXILLARY VEIN;  Surgeon: Algernon Huxley, MD;  Location: [facility] ORS;  Service: Vascular;  Laterality: Left;   CAPD INSERTION N/A 12/31/2019   Procedure: LAPAROSCOPIC INSERTION CONTINUOUS AMBULATORY PERITONEAL DIALYSIS  (CAPD) CATHETER;  Surgeon: Jules Husbands, MD;  Location: [facility];  Service: General;  Laterality: N/A;   CAPD REMOVAL N/A 04/10/2020   Procedure: LAPAROSCOPIC REVISION OF CONTINUOUS AMBULATORY PERITONEAL DIALYSIS  (CAPD) CATHETER;  Surgeon: Jules Husbands, MD;  Location: [facility];  Service: General;  Laterality: N/A;   CORONARY ARTERY BYPASS GRAFT N/A 12/27/2018   Procedure: CORONARY ARTERY BYPASS GRAFTING (CABG) X 4 ON PUMP USING RIGHT & LEFT INTERNAL MAMMARY ARTERY LEFT RADIAL ARTERY ENDOSCOPICALLY HARVESTED;  Surgeon: [name], MD;  Location: [place];  Service: Open Heart Surgery;  Laterality: N/A;   CYSTOSCOPY W/ RETROGRADES Bilateral 05/15/2019  Procedure: CYSTOSCOPY WITH RETROGRADE PYELOGRAM;  Surgeon: Abbie Sons, MD;  Location: [facility];  Service: Urology;  Laterality: Bilateral;   CYSTOSCOPY WITH BIOPSY N/A 05/15/2019   Procedure: CYSTOSCOPY WITH bladder BIOPSY;  Surgeon: Abbie Sons, MD;  Location: [facility];  Service: Urology;  Laterality: N/A;   DIALYSIS/PERMA CATHETER INSERTION N/A 12/28/2019   Procedure: DIALYSIS/PERMA CATHETER INSERTION;  Surgeon: Algernon Huxley, MD;  Location: [place] [facility];  Service: Cardiovascular;  Laterality: N/A;   DIALYSIS/PERMA CATHETER INSERTION N/A 03/18/2021   Procedure: DIALYSIS/PERMA CATHETER INSERTION;  Surgeon: Algernon Huxley, MD;  Location: [place] [facility];  Service: Cardiovascular;  Laterality: N/A;   DIALYSIS/PERMA CATHETER REMOVAL N/A 06/02/2020   Procedure: DIALYSIS/PERMA CATHETER REMOVAL;  Surgeon: Algernon Huxley, MD;  Location: [place] [facility];  Service: Cardiovascular;  Laterality: N/A;   EXCHANGE OF A DIALYSIS CATHETER Right 04/10/2020   Procedure: EXCHANGE OF A DIALYSIS CATHETER;  Surgeon: Jules Husbands, MD;  Location: [facility];  Service: General;  Laterality: Right;   [place]  01/20/2021   Procedure: HERNIA REPAIR INCISIONAL;  Surgeon: Olean Ree, MD;  Location: [facility];  Service: General;;   IR IMAGE GUIDED DRAINAGE PERCUT CATH  PERITONEAL RETROPERIT  04/07/2020   LAPAROSCOPY N/A 04/16/2021   Procedure: LAPAROSCOPY DIAGNOSTIC;  Surgeon: Benjamine Sprague, DO;  Location: [facility];  Service: General;  Laterality: N/A;   LEFT HEART CATH AND CORONARY ANGIOGRAPHY Left 12/20/2018   Procedure: LEFT HEART CATH AND CORONARY ANGIOGRAPHY;  Surgeon: Isaias Cowman, MD;  Location: [place] [facility];  Service: Cardiovascular;  Laterality: Left;   RADIAL ARTERY HARVEST Left 12/27/2018   Procedure: ENDOSCOPIC RADIAL ARTERY HARVEST;  Surgeon: [name], MD;  Location: [place];  Service: Open Heart Surgery;  Laterality: Left;   REMOVAL OF A DIALYSIS CATHETER Left 03/20/2021   Procedure: REMOVAL OF A PD CATHETER;  Surgeon: Algernon Huxley, MD;  Location: [facility];  Service: Vascular;  Laterality: Left;   REVISION OF ARTERIOVENOUS GORETEX GRAFT Left 09/11/2021   Procedure: Excisionof infected AV graft;  Surgeon: Katha Cabal, MD;  Location: [facility];  Service: Vascular;  Laterality: Left;   TEE WITHOUT CARDIOVERSION N/A 12/27/2018   Procedure: TRANSESOPHAGEAL ECHOCARDIOGRAM (TEE);  Surgeon: [name],  MD;  Location: [place];  Service: Open Heart Surgery;  Laterality: N/A;   TUMOR REMOVAL  2019   Bladder    Medications Prior to Admission  Medication Sig Dispense Refill Last Dose   amLODipine (NORVASC) 10 MG tablet Take 1 tablet (10 mg total) by mouth daily. 30 tablet 0 04/18/2022   atorvastatin (LIPITOR) 80 MG tablet Take 0.5 tablets (40 mg total) by mouth daily. (Patient taking differently: Take 80 mg by mouth daily.) 30 tablet 0 04/18/2022   calcitRIOL (ROCALTROL) 0.5 MCG capsule Take 1 capsule (0.5 mcg total) by mouth daily. 30 capsule 0 04/18/2022   calcium acetate (PHOSLO) 667 MG capsule Take 1 capsule (667 mg total) by mouth 3 (three) times daily with meals. 30 capsule 0 04/18/2022   cloNIDine (CATAPRES) 0.2 MG tablet Take 0.2 mg by mouth 2 (two) times daily.   04/18/2022   metoprolol succinate (TOPROL-XL) 25 MG 24 hr tablet Take 25 mg by mouth daily.   04/18/2022   multivitamin (RENA-VIT) TABS tablet Take 1 tablet by mouth at bedtime. 30 tablet 1 04/18/2022   sucroferric oxyhydroxide (VELPHORO) 500 MG chewable tablet Chew 1 tablet (500 mg total) by mouth 3 (three) times daily  with meals. 90 tablet 0 04/18/2022   Vitamin D, Ergocalciferol, (DRISDOL) 1.25 MG (50000 UNIT) CAPS capsule Take 1 capsule (50,000 Units total) by mouth every 7 (seven) days. 5 capsule 0 Past Week   acetaminophen (TYLENOL) 500 MG tablet Take 2 tablets (1,000 mg total) by mouth every 6 (six) hours as needed for mild pain, fever or headache (or Fever >/= 101). 30 tablet 0 prn at prn   aspirin EC 81 MG tablet Take 1 tablet (81 mg total) by mouth daily. Swallow whole. (Patient not taking: Reported on 04/19/2022) 30 tablet 0 Not Taking   clopidogrel (PLAVIX) 75 MG tablet Take 75 mg by mouth daily. (Patient not taking: Reported on 04/19/2022)   Not Taking   losartan (COZAAR) 100 MG tablet Take 100 mg by mouth daily. (Patient not taking: Reported on 04/19/2022)   Not Taking   sacubitril-valsartan (ENTRESTO) 24-26 MG Take 1 tablet by  mouth 2 (two) times daily. (Patient not taking: Reported on 04/19/2022)   Not Taking    Social History   Socioeconomic History   Marital status: Married    Spouse name: Not on file   Number of children: Not on file   Years of education: Not on file   Highest education level: Not on file  Occupational History   Not on file  Tobacco Use   Smoking status: Former    Packs/day: 0.25    Types: Cigarettes    Quit date: 02/19/2019    Years since quitting: 3.1   Smokeless tobacco: Never  Vaping Use   Vaping Use: Never used  Substance and Sexual Activity   Alcohol use: Never   Drug use: Never   Sexual activity: Yes  Other Topics Concern   Not on file  Social History Narrative   Not on file   Social Determinants of Health   Financial Resource Strain: Not on file  Food Insecurity: No Food Insecurity (04/19/2022)   Hunger Vital Sign    Worried About Running Out of Food in the Last Year: Never true    Ran Out of Food in the Last Year: Never true  Transportation Needs: No Transportation Needs (04/19/2022)   PRAPARE - Hydrologist (Medical): No    Lack of Transportation (Non-Medical): No  Physical Activity: Not on file  Stress: Not on file  Social Connections: Not on file  Intimate Partner Violence: Not At Risk (04/19/2022)   Humiliation, Afraid, Rape, and Kick questionnaire    Fear of Current or Ex-Partner: No    Emotionally Abused: No    Physically Abused: No    Sexually Abused: No    Family History  Family history unknown: Yes      Intake/Output Summary (Last 24 hours) at 04/20/2022 1423 Last data filed at 04/20/2022 1156 Gross per 24 hour  Intake 299.4 ml  Output 1200 ml  Net -900.6 ml    Vitals:   04/20/22 1130 04/20/22 1156 04/20/22 1200 04/20/22 1220  BP: 103/75 116/88  98/77  Pulse: 70 66  73  Resp: '18 18  14  '$ Temp:  98.5 F (36.9 C)  97.9 F (36.6 C)  TempSrc:  Oral  Oral  SpO2: 97% 97%  95%  Weight:   53.1 kg   Height:         PHYSICAL EXAM General: middle aged thin male, in no acute distress. Laying at incline in bed. HEENT:  Normocephalic and atraumatic. Neck:  No JVD.  Lungs: Normal respiratory effort  on room air. Decreased breath sounds, no crackles or wheezing Chest: well healed prominent median sternotomy scar, nontender to palpation Heart: HRRR . Normal S1 and S2 without gallops or murmurs.  Abdomen: Non-distended appearing.  Msk: Normal strength and tone for age. Extremities: Warm and well perfused. No clubbing, cyanosis. No peripheral edema.  Neuro: Alert and oriented X 3. Psych:  Answers questions appropriately.   Labs: Basic Metabolic Panel: Recent Labs    04/19/22 0409 04/19/22 2139 04/20/22 0807  NA 135  --  138  K 5.9* 3.9 4.7  CL 100  --  98  CO2 18*  --  25  GLUCOSE 94  --  85  BUN 62*  --  46*  CREATININE 8.30*  --  6.37*  CALCIUM 6.6*  --  6.9*    Liver Function Tests: Recent Labs    04/18/22 2337 04/20/22 0806  AST 18  --   ALT 18  --   ALKPHOS 59  --   BILITOT 0.6  --   PROT 7.2  --   ALBUMIN 3.8 3.3*    Recent Labs    04/18/22 2337  LIPASE 56*    CBC: Recent Labs    04/18/22 2337 04/19/22 0409 04/20/22 0807  WBC 11.1* 13.9* 8.8  NEUTROABS 7.4  --   --   HGB 12.2* 11.7* 11.6*  HCT 40.2 37.4* 38.3*  MCV 93.5 92.6 93.4  PLT 268 275 267    Cardiac Enzymes: Recent Labs    04/18/22 2337 04/19/22 0132 04/19/22 1346  TROPONINIHS 467* 473* 684*    BNP: Recent Labs    04/18/22 2237  BNP 843.6*    D-Dimer: No results for input(s): "DDIMER" in the last 72 hours. Hemoglobin A1C: No results for input(s): "HGBA1C" in the last 72 hours. Fasting Lipid Panel: Recent Labs    04/19/22 0409  CHOL 147  HDL 43  LDLCALC 90  TRIG 72  CHOLHDL 3.4    Thyroid Function Tests: No results for input(s): "TSH", "T4TOTAL", "T3FREE", "THYROIDAB" in the last 72 hours.  Invalid input(s): "FREET3" Anemia Panel: No results for input(s): "VITAMINB12",  "FOLATE", "FERRITIN", "TIBC", "IRON", "RETICCTPCT" in the last 72 hours.   Radiology: ECHOCARDIOGRAM COMPLETE  Result Date: 04/19/2022    ECHOCARDIOGRAM REPORT   Patient Name:   ANTHONYMICHAEL MCGARY Date of Exam: 04/19/2022 Medical Rec #:  [id number]    Height:       63.0 in Accession #:    [id number]   Weight:       137.0 lb Date of Birth:  September 23, 1955   BSA:          1.646 m Patient Age:    67 years     BP:           129/81 mmHg Patient Gender: M            HR:           71 bpm. Exam Location:  [facility] Procedure: 2D Echo, Cardiac Doppler and Color Doppler Indications:     NSTEMI  History:         Patient has prior history of Echocardiogram examinations, most                  recent 12/28/2019. CHF, Acute MI, Previous Myocardial Infarction                  and CAD, Prior CABG, Arrythmias:Atrial Fibrillation; Risk  Factors:Hypertension, Current Smoker and Dyslipidemia.                  Pulmonary embolus, AAA.  Sonographer:     Wenda Low Referring Phys:  [id number] JAN A MANSY Diagnosing Phys: Kate Sable MD IMPRESSIONS  1. Left ventricular ejection fraction, by estimation, is 55 to 60%. The left ventricle has normal function. The left ventricle has no regional wall motion abnormalities. There is moderate concentric left ventricular hypertrophy. Left ventricular diastolic parameters are consistent with Grade I diastolic dysfunction (impaired relaxation).  2. Right ventricular systolic function is normal. The right ventricular size is normal. There is normal pulmonary artery systolic pressure.  3. The mitral valve is normal in structure. No evidence of mitral valve regurgitation.  4. The aortic valve is tricuspid. Aortic valve regurgitation is not visualized. Aortic valve sclerosis/calcification is present, without any evidence of aortic stenosis.  5. The inferior vena cava is normal in size with greater than 50% respiratory variability, suggesting right atrial pressure of 3 mmHg. FINDINGS  Left  Ventricle: Left ventricular ejection fraction, by estimation, is 55 to 60%. The left ventricle has normal function. The left ventricle has no regional wall motion abnormalities. The left ventricular internal cavity size was normal in size. There is  moderate concentric left ventricular hypertrophy. Left ventricular diastolic parameters are consistent with Grade I diastolic dysfunction (impaired relaxation). Right Ventricle: The right ventricular size is normal. No increase in right ventricular wall thickness. Right ventricular systolic function is normal. There is normal pulmonary artery systolic pressure. The tricuspid regurgitant velocity is 2.18 m/s, and  with an assumed right atrial pressure of 3 mmHg, the estimated right ventricular systolic pressure is [id number] mmHg. Left Atrium: Left atrial size was normal in size. Right Atrium: Right atrial size was normal in size. Pericardium: There is no evidence of pericardial effusion. Mitral Valve: The mitral valve is normal in structure. No evidence of mitral valve regurgitation. MV peak gradient, 6.4 mmHg. The mean mitral valve gradient is 2.0 mmHg. Tricuspid Valve: The tricuspid valve is normal in structure. Tricuspid valve regurgitation is trivial. Aortic Valve: The aortic valve is tricuspid. Aortic valve regurgitation is not visualized. Aortic valve sclerosis/calcification is present, without any evidence of aortic stenosis. Aortic valve mean gradient measures 6.0 mmHg. Aortic valve peak gradient measures 11.0 mmHg. Aortic valve area, by VTI measures 2.03 cm. Pulmonic Valve: The pulmonic valve was normal in structure. Pulmonic valve regurgitation is not visualized. Aorta: The aortic root is normal in size and structure. Venous: The inferior vena cava is normal in size with greater than 50% respiratory variability, suggesting right atrial pressure of 3 mmHg. IAS/Shunts: No atrial level shunt detected by color flow Doppler.  LEFT VENTRICLE PLAX 2D LVIDd:         3.80 cm    Diastology LVIDs:         2.90 cm   LV e' medial:    5.44 cm/s LV PW:         1.70 cm   LV E/e' medial:  9.3 LV IVS:        1.40 cm   LV e' lateral:   12.20 cm/s LVOT diam:     1.90 cm   LV E/e' lateral: 4.2 LV SV:         62 LV SV Index:   38 LVOT Area:     2.84 cm  RIGHT VENTRICLE RV Basal diam:  4.10 cm RV Mid diam:    3.60 cm RV  S prime:     6.96 cm/s LEFT ATRIUM             Index        RIGHT ATRIUM           Index LA diam:        3.90 cm 2.37 cm/m   RA Area:     14.60 cm LA Vol (A2C):   62.0 ml 37.66 ml/m  RA Volume:   30.80 ml  18.71 ml/m LA Vol (A4C):   31.5 ml 19.13 ml/m LA Biplane Vol: 44.0 ml 26.72 ml/m  AORTIC VALVE                     PULMONIC VALVE AV Area (Vmax):    2.17 cm      PV Vmax:       1.12 m/s AV Area (Vmean):   2.10 cm      PV Peak grad:  5.0 mmHg AV Area (VTI):     2.03 cm AV Vmax:           166.00 cm/s AV Vmean:          119.000 cm/s AV VTI:            0.307 m AV Peak Grad:      11.0 mmHg AV Mean Grad:      6.0 mmHg LVOT Vmax:         127.00 cm/s LVOT Vmean:        88.000 cm/s LVOT VTI:          0.220 m LVOT/AV VTI ratio: 0.72  AORTA Ao Root diam: 3.60 cm MITRAL VALVE               TRICUSPID VALVE MV Area (PHT): 2.70 cm    TR Peak grad:   19.0 mmHg MV Area VTI:   2.00 cm    TR Vmax:        218.00 cm/s MV Peak grad:  6.4 mmHg MV Mean grad:  2.0 mmHg    SHUNTS MV Vmax:       1.26 m/s    Systemic VTI:  0.22 m MV Vmean:      58.5 cm/s   Systemic Diam: 1.90 cm MV Decel Time: 281 msec MV E velocity: 50.80 cm/s MV A velocity: 68.10 cm/s MV E/A ratio:  0.75 Kate Sable MD Electronically signed by Kate Sable MD Signature Date/Time: 04/19/2022/4:02:54 PM    Final    CT ABDOMEN PELVIS W CONTRAST  Result Date: 04/19/2022 CLINICAL DATA:  Abdominal pain, acute, nonlocalized; Pulmonary embolism (PE) suspected, high prob EXAM: CT ANGIOGRAPHY CHEST CT ABDOMEN AND PELVIS WITH CONTRAST TECHNIQUE: Multidetector CT imaging of the chest was performed using the standard protocol  during bolus administration of intravenous contrast. Multiplanar CT image reconstructions and MIPs were obtained to evaluate the vascular anatomy. Multidetector CT imaging of the abdomen and pelvis was performed using the standard protocol during bolus administration of intravenous contrast. RADIATION DOSE REDUCTION: This exam was performed according to the departmental dose-optimization program which includes automated exposure control, adjustment of the mA and/or kV according to patient size and/or use of iterative reconstruction technique. CONTRAST:  188m OMNIPAQUE IOHEXOL 350 MG/ML SOLN COMPARISON:  11/16/2021 FINDINGS: CTA CHEST FINDINGS Cardiovascular: There is adequate opacification of the pulmonary arterial tree. A small intraluminal filling defect is identified within the segmental pulmonary arteries of the left lower lobe in keeping with changes of acute pulmonary embolism. The embolic burden  is small. The central pulmonary arteries are of normal caliber. No CT evidence of right heart strain. Coronary artery bypass grafting has been performed. Cardiac size is globally within normal limits though left ventricular hypertrophy is noted. No pericardial effusion. Mild atherosclerotic calcification noted within the thoracic aorta. No aortic aneurysm. Right internal jugular hemodialysis catheter tip noted within the superior right atrium. Mediastinum/Nodes: No enlarged mediastinal, hilar, or axillary lymph nodes. Thyroid gland, trachea, and esophagus demonstrate no significant findings. Lungs/Pleura: Moderate emphysema. No superimposed focal pulmonary infiltrate. No pneumothorax or pleural effusion. No central obstructing lesion. Musculoskeletal: Nonunion of the sternal body noted. No acute bone abnormality. No lytic or blastic bone lesion. Review of the MIP images confirms the above findings. CT ABDOMEN and PELVIS FINDINGS Hepatobiliary: Multiple simple cysts and at least 3 benign cavernous hemangioma are seen  within the liver. No suspicious enhancing intrahepatic masses identified. No intra or extrahepatic biliary ductal dilation. Gallbladder unremarkable. Pancreas: Unremarkable. No pancreatic ductal dilatation or surrounding inflammatory changes. Spleen: Normal in size without focal abnormality. Adrenals/Urinary Tract: The adrenal glands are unremarkable. The kidneys are markedly atrophic bilaterally but are otherwise unremarkable. The bladder is decompressed Stomach/Bowel: Stomach is within normal limits. Appendix appears normal. No evidence of bowel wall thickening, distention, or inflammatory changes. Vascular/Lymphatic: Extensive aortoiliac atherosclerotic calcification. No aortic aneurysm. No pathologic adenopathy within the abdomen and pelvis. Reproductive: Prostate is unremarkable. Other: No abdominal wall hernia or abnormality. No abdominopelvic ascites. Musculoskeletal: No acute bone abnormality. No lytic or blastic bone lesion. Lumbar levoscoliosis noted. Degenerative changes noted at the lumbosacral junction. Review of the MIP images confirms the above findings. IMPRESSION: 1. Acute pulmonary embolism. The embolic burden is small. No CT evidence of right heart strain. 2. Left ventricular hypertrophy. 3. Moderate emphysema. 4. No acute intra-abdominal pathology identified. 5. Marked bilateral renal atrophy in keeping with chronic renal failure. 6. Extensive aortoiliac atherosclerotic calcification. Aortic Atherosclerosis (ICD10-I70.0) and Emphysema (ICD10-J43.9). Electronically Signed   By: Fidela Salisbury M.D.   On: 04/19/2022 00:57   CT Angio Chest PE W and/or Wo Contrast  Result Date: 04/19/2022 CLINICAL DATA:  Abdominal pain, acute, nonlocalized; Pulmonary embolism (PE) suspected, high prob EXAM: CT ANGIOGRAPHY CHEST CT ABDOMEN AND PELVIS WITH CONTRAST TECHNIQUE: Multidetector CT imaging of the chest was performed using the standard protocol during bolus administration of intravenous contrast.  Multiplanar CT image reconstructions and MIPs were obtained to evaluate the vascular anatomy. Multidetector CT imaging of the abdomen and pelvis was performed using the standard protocol during bolus administration of intravenous contrast. RADIATION DOSE REDUCTION: This exam was performed according to the departmental dose-optimization program which includes automated exposure control, adjustment of the mA and/or kV according to patient size and/or use of iterative reconstruction technique. CONTRAST:  178m OMNIPAQUE IOHEXOL 350 MG/ML SOLN COMPARISON:  11/16/2021 FINDINGS: CTA CHEST FINDINGS Cardiovascular: There is adequate opacification of the pulmonary arterial tree. A small intraluminal filling defect is identified within the segmental pulmonary arteries of the left lower lobe in keeping with changes of acute pulmonary embolism. The embolic burden is small. The central pulmonary arteries are of normal caliber. No CT evidence of right heart strain. Coronary artery bypass grafting has been performed. Cardiac size is globally within normal limits though left ventricular hypertrophy is noted. No pericardial effusion. Mild atherosclerotic calcification noted within the thoracic aorta. No aortic aneurysm. Right internal jugular hemodialysis catheter tip noted within the superior right atrium. Mediastinum/Nodes: No enlarged mediastinal, hilar, or axillary lymph nodes. Thyroid gland, trachea, and esophagus demonstrate no  significant findings. Lungs/Pleura: Moderate emphysema. No superimposed focal pulmonary infiltrate. No pneumothorax or pleural effusion. No central obstructing lesion. Musculoskeletal: Nonunion of the sternal body noted. No acute bone abnormality. No lytic or blastic bone lesion. Review of the MIP images confirms the above findings. CT ABDOMEN and PELVIS FINDINGS Hepatobiliary: Multiple simple cysts and at least 3 benign cavernous hemangioma are seen within the liver. No suspicious enhancing  intrahepatic masses identified. No intra or extrahepatic biliary ductal dilation. Gallbladder unremarkable. Pancreas: Unremarkable. No pancreatic ductal dilatation or surrounding inflammatory changes. Spleen: Normal in size without focal abnormality. Adrenals/Urinary Tract: The adrenal glands are unremarkable. The kidneys are markedly atrophic bilaterally but are otherwise unremarkable. The bladder is decompressed Stomach/Bowel: Stomach is within normal limits. Appendix appears normal. No evidence of bowel wall thickening, distention, or inflammatory changes. Vascular/Lymphatic: Extensive aortoiliac atherosclerotic calcification. No aortic aneurysm. No pathologic adenopathy within the abdomen and pelvis. Reproductive: Prostate is unremarkable. Other: No abdominal wall hernia or abnormality. No abdominopelvic ascites. Musculoskeletal: No acute bone abnormality. No lytic or blastic bone lesion. Lumbar levoscoliosis noted. Degenerative changes noted at the lumbosacral junction. Review of the MIP images confirms the above findings. IMPRESSION: 1. Acute pulmonary embolism. The embolic burden is small. No CT evidence of right heart strain. 2. Left ventricular hypertrophy. 3. Moderate emphysema. 4. No acute intra-abdominal pathology identified. 5. Marked bilateral renal atrophy in keeping with chronic renal failure. 6. Extensive aortoiliac atherosclerotic calcification. Aortic Atherosclerosis (ICD10-I70.0) and Emphysema (ICD10-J43.9). Electronically Signed   By: Fidela Salisbury M.D.   On: 04/19/2022 00:57   DG Chest Portable 1 View  Result Date: 04/19/2022 CLINICAL DATA:  Three weeks history of shortness of breath. EXAM: PORTABLE CHEST 1 VIEW COMPARISON:  CTA chest 11/16/21. 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 No vascular congestion is seen. Right IJ dialysis catheter again noted with tip about the superior cavoatrial junction. Stable mediastinum with aortic tortuosity and calcific plaque. The  lungs are emphysematous but clear. No pleural effusion. S shaped thoracolumbar scoliosis. IMPRESSION: 1. No evidence of acute chest process. Emphysema. 2. Aortic atherosclerosis and tortuosity. Electronically Signed   By: Telford Nab M.D.   On: 04/19/2022 00:07    ECHO 12/28/2019  1. Left ventricular ejection fraction, by estimation, is 20 to 25%. The  left ventricle has severely decreased function. The left ventricle has no  regional wall motion abnormalities. The left ventricular internal cavity  size was moderately dilated. Left  ventricular diastolic parameters are consistent with Grade I diastolic  dysfunction (impaired relaxation).   2. Right ventricular systolic function is normal. The right ventricular  size is normal.   3. The mitral valve is normal in structure. Moderate mitral valve  regurgitation. No evidence of mitral stenosis.   4. The aortic valve is normal in structure. Aortic valve regurgitation is  mild to moderate. No aortic stenosis is present.   5. The inferior vena cava is normal in size with greater than 50%  respiratory variability, suggesting right atrial pressure of 3 mmHg.   TELEMETRY reviewed by me (LT) 04/20/2022 : NSR 60s-70s  EKG reviewed by me: NSR 1* AV block, nonspecific ST changes similar to prior from 10/2021  Data reviewed by me (LT) 04/20/2022: hospitalist progress note last 24h vitals tele labs imaging I/O    Principal Problem:   NSTEMI (non-ST elevated myocardial infarction) [facility]) Active Problems:   Chronic combined systolic and diastolic CHF (congestive heart failure) ([place])   Hypertensive urgency   Acute pulmonary embolism ([place])  Dyslipidemia   End-stage renal disease on hemodialysis (HCC)   Tobacco dependence    ASSESSMENT AND PLAN:  Eastan "Clair Gulling" Montante is a 63yoM with a PMH of CAD s/p CABG x 4 (2020), HFrEF (20-25%, g1DD, mod MR, mi-mod AR 2021), ESRD (T,TH,S HD), PAD, hx tobacco use who presented to [facility] ED 04/18/22 with worsening dyspnea  x 3 weeks and abdominal burning with an episode of vomiting the day prior to admission. Troponin elevated (467, 473) and CTA chest positive for LLL PE with small clot burden. Cardiology is consulted because of his elevated troponin, most c/w demand ischemia in the setting of PE.   # acute LLL PE Seen on CTA chest, small clot burden in LLL. Hemodynamically stable. Started on a heparin infusion  # CAD s/p CABG w/ atypical chest pain # demand ischemia Reports several weeks of worsening dyspnea on exertion + intermittent burning in his epigastrium, occasionally with exertion, sometimes relieved with eating or laying down to rest. Reproducible discomfort to palpation of his sternum + left chest wall, and epigastrium. Dissimilar to the L sharp discomfort he experienced Pre-CABG. EKG with nonspecific TW abnormality, similar to prior from 10/2021, HS trop elevated but flat trending 467, 473, 684 most consistent with demand/supply mismatch and not ACS. Chest pain free today.  - s/p '325mg'$  aspirin, continue aspirin '81mg'$  & plavix '75mg'$  daily  - continue atorva '80mg'$  daily - continue metoprolol XL '25mg'$  daily.  - echo complete without rWMAs, actual improvement in EF compared to 39yr ago - continue management of PE above and complete lexiscan myoview as scheduled outpatient on 3/4.  -will arrange for follow up in clinic with ALeroy Libman PA-C following discharge and outpatient myoview  # chronic HF improved EF (55-60%, G1 DD 2/26/ 2024, previously 20-25%, g1DD, mod MR, mi-mod AR 2021)  Euvolmic on exam, BNP elevated at 843 on admission. Makes a little urine, not on diuretics on non-dialysis days.  - continue GDMT with metoprolol, entresto 24-'26mg'$  daily - continue isordil '10mg'$  TID & hydral '25mg'$  TID for afterload reduction - defer MRA, sglt2i with renal insufficiency - volume per dialysis    # ESRD on (T, TH, S HD)  S/p treatment today since he received contrast with CTA Chest.   # tobacco abuse Recently  restarted smoking 1/2 PPD. Strongly recommended complete cessation. He is motivated to quit, continue nicotine patches.  Cardiology will sign off. Please haiku with questions or re-engage if needed.    This patient's plan of care was discussed and created with Dr. CClayborn Bignessand he is in agreement.  Signed: LTristan Schroeder, PA-C 04/20/2022, 2:23 PM [facility]

## 2022-04-20 NOTE — Hospital Course (Signed)
Anthony Hess is a 67 y.o. male with medical history significant for coronary artery disease, status post 4-vessel CABG, end-stage renal disease on hemodialysis on TTS, heart failure with reduced EF, hypertension, dyslipidemia, and peripheral vascular disease, who presented to the emergency room with acute onset of worsening dyspnea over the last 3 weeks as well as chest discomfort.    See full H&P for detailed HPI on admission.   He was found to have a small LLL acute PE, no right heart strain. Admitted to the hospital and started on IV heparin.

## 2022-04-20 NOTE — Progress Notes (Signed)
Received patient in bed to unit.  Alert and oriented.  Informed consent signed and in chart.   TX duration: 3.5  Patient tolerated well.  Transported back to the room  Alert, without acute distress.  Hand-off given to patient's nurse.   Access used: CVC Access issues: None  Total UF removed: 1000 ml Medication(s) given: none Post HD VS: stable Post HD weight: 53.1 kg   [facility]

## 2022-04-20 NOTE — Progress Notes (Signed)
ANTICOAGULATION CONSULT NOTE  Pharmacy Consult for Heparin  Indication: ACS/STEMI  No Known Allergies  Patient Measurements: Height: '5\' 3"'$  (160 cm) Weight: 53.1 kg (117 lb 1 oz) IBW/kg (Calculated) : 56.9 Heparin Dosing Weight: 62.1 kg  Vital Signs: Temp: 98.4 F (36.9 C) (02/27 2053) Temp Source: Oral (02/27 2053) BP: 103/74 (02/27 2053) Pulse Rate: 79 (02/27 2053)  Labs: Recent Labs    04/18/22 0032 04/18/22 2337 04/18/22 2337 04/19/22 0132 04/19/22 0409 04/19/22 1346 04/19/22 1346 04/20/22 0025 04/20/22 0807 04/20/22 1218 04/20/22 2117  HGB  --  12.2*   < >  --  11.7*  --   --   --  11.6*  --   --   HCT  --  40.2  --   --  37.4*  --   --   --  38.3*  --   --   PLT  --  268  --   --  275  --   --   --  267  --   --   APTT 29  --   --   --   --   --   --   --   --   --   --   LABPROT 13.4  --   --   --   --   --   --   --   --   --   --   INR 1.0  --   --   --   --   --   --   --   --   --   --   HEPARINUNFRC  --   --   --   --   --  0.12*   < > 0.12*  --  0.31 <0.10*  CREATININE  --  8.20*  --   --  8.30*  --   --   --  6.37*  --   --   TROPONINIHS  --  467*  --  473*  --  684*  --   --   --   --   --    < > = values in this interval not displayed.     Estimated Creatinine Clearance: 8.8 mL/min (A) (by C-G formula based on SCr of 6.37 mg/dL (H)).   Medical History: Past Medical History:  Diagnosis Date   Aortic atherosclerosis ([place])    Bilateral carotid artery disease ([place])    Bladder cancer ([place])    Coronary artery disease 12/20/2018   a.) LHC 12/20/2018: 50% OM1, 40% OM2, 95% o-pLAD, 75% o=pLCx, 40% mLM, 70% D1, 60% mRCA-1, 50% mRCA-2; refer to CVTS. b.) [facility]v CABG at [facility] on 12/27/2018: LIMA-LAD, RIMA-PDA, seg LRA-OM1-D1   DCM (dilated cardiomyopathy) ([place]) 12/05/2018   a.) TTE 12/05/2018: EF 40-45%. b.) TTE 12/28/2019: EF 20-25%.   ESRD (end stage renal disease) ([place])    a.) T-Th-Sat   HFrEF (heart failure with reduced ejection fraction) ([place])  12/05/2018   a.) TTE 12/05/2018: EF 40-45%; mild LVH; ant/apical/sep HK; mild TR . b.) TTE 12/28/2019: EF 20-25%; mod LVH; mod MR/AR; G1DD.   History of 2019 novel coronavirus disease (COVID-19) 04/08/2021   History of kidney stones    HLD (hyperlipidemia)    Hypertension    Infrarenal abdominal aortic aneurysm (AAA) without rupture ([place]) 03/05/2021   a.) CT abd/pelvis; measured 3.2 cm.   Myocardial infarction [facility])    PVD (peripheral vascular disease) (HCC)    S/P CABG x 4  12/27/2018   a.) LIMA-LAD, RIMA-PDA, sequential LEFT radial artery to OM1 and D1   Sepsis ([place]) 03/14/2021   Wears glasses     Assessment: Pt is a 67 yo male presenting to ED c/o SOB, found with acute LLL PE and atypical chest pain. PMH includes CAD s/p CABG, chronic HFrEF (EF 20-25%), ESRD HD T/Th/Sat. Pt has a small clot burden in LLL.   2/27 0025 HL 0.12  2/27 1218 HL 0.31  2/27 2117 HL 0.<0.1  Goal of Therapy:  Heparin level 0.3-0.7 units/ml Monitor platelets by anticoagulation protocol: Yes  Plan:  2/27:  HL @ 2117 = < 0.1 - spoke with RN who said that there had not been any interruptions in heparin infusion - Will order heparin 1850 units IV X 1 bolus and increase drip rate to 1400 units/hr. - Will recheck HL 8 hrs after rate change CBC daily  Franshesca Chipman D, PharmD Clinical Pharmacist  04/20/2022 10:41 PM

## 2022-04-20 NOTE — Progress Notes (Signed)
ANTICOAGULATION CONSULT NOTE  Pharmacy Consult for Heparin  Indication: ACS/STEMI  No Known Allergies  Patient Measurements: Height: '5\' 3"'$  (160 cm) Weight: 53.1 kg (117 lb 1 oz) IBW/kg (Calculated) : 56.9 Heparin Dosing Weight: 62.1 kg  Vital Signs: Temp: 97.9 F (36.6 C) (02/27 1220) Temp Source: Oral (02/27 1220) BP: 98/77 (02/27 1220) Pulse Rate: 73 (02/27 1220)  Labs: Recent Labs    04/18/22 0032 04/18/22 2337 04/18/22 2337 04/19/22 0132 04/19/22 0409 04/19/22 1346 04/20/22 0025 04/20/22 0807 04/20/22 1218  HGB  --  12.2*   < >  --  11.7*  --   --  11.6*  --   HCT  --  40.2  --   --  37.4*  --   --  38.3*  --   PLT  --  268  --   --  275  --   --  267  --   APTT 29  --   --   --   --   --   --   --   --   LABPROT 13.4  --   --   --   --   --   --   --   --   INR 1.0  --   --   --   --   --   --   --   --   HEPARINUNFRC  --   --   --   --   --  0.12* 0.12*  --  0.31  CREATININE  --  8.20*  --   --  8.30*  --   --  6.37*  --   TROPONINIHS  --  467*  --  473*  --  684*  --   --   --    < > = values in this interval not displayed.     Estimated Creatinine Clearance: 8.8 mL/min (A) (by C-G formula based on SCr of 6.37 mg/dL (H)).   Medical History: Past Medical History:  Diagnosis Date   Aortic atherosclerosis ([place])    Bilateral carotid artery disease ([place])    Bladder cancer ([place])    Coronary artery disease 12/20/2018   a.) LHC 12/20/2018: 50% OM1, 40% OM2, 95% o-pLAD, 75% o=pLCx, 40% mLM, 70% D1, 60% mRCA-1, 50% mRCA-2; refer to CVTS. b.) 4v CABG at [facility] on 12/27/2018: LIMA-LAD, RIMA-PDA, seg LRA-OM1-D1   DCM (dilated cardiomyopathy) ([place]) 12/05/2018   a.) TTE 12/05/2018: EF 40-45%. b.) TTE 12/28/2019: EF 20-25%.   ESRD (end stage renal disease) ([place])    a.) T-Th-Sat   HFrEF (heart failure with reduced ejection fraction) ([place]) 12/05/2018   a.) TTE 12/05/2018: EF 40-45%; mild LVH; ant/apical/sep HK; mild TR . b.) TTE 12/28/2019: EF 20-25%; mod LVH; mod  MR/AR; G1DD.   History of 2019 novel coronavirus disease (COVID-19) 04/08/2021   History of kidney stones    HLD (hyperlipidemia)    Hypertension    Infrarenal abdominal aortic aneurysm (AAA) without rupture ([place]) 03/05/2021   a.) CT abd/pelvis; measured 3.2 cm.   Myocardial infarction [facility])    PVD (peripheral vascular disease) ([place])    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   a.) LIMA-LAD, RIMA-PDA, sequential LEFT radial artery to OM1 and D1   Sepsis ([place]) 03/14/2021   Wears glasses     Assessment: Pt is a 67 yo male presenting to ED c/o SOB, found with acute LLL PE and atypical chest pain. PMH includes CAD s/p CABG, chronic HFrEF (EF 20-25%),  ESRD HD T/Th/Sat. Pt has a small clot burden in LLL.   2/27 0025 HL 0.12  2/27 1218 HL 0.31   Goal of Therapy:  Heparin level 0.3-0.7 units/ml Monitor platelets by anticoagulation protocol: Yes  Plan:  Heparin level is therapeutic. Will continue heparin infusion at 1150 units/hr. Recheck heparin level in 8 hours. CBC daily while on heparin.    Oswald Hillock, PharmD, BCPS  Clinical Pharmacist  04/20/2022 1:34 PM

## 2022-04-20 NOTE — Progress Notes (Signed)
Patient BP  80/57 (65). Charge Nurse made aware. UF off and goal decrease to 1L per Charge Nurse request. Patient is asymptomatic.

## 2022-04-21 ENCOUNTER — Other Ambulatory Visit: Payer: Self-pay

## 2022-04-21 ENCOUNTER — Emergency Department
Admission: EM | Admit: 2022-04-21 | Discharge: 2022-04-21 | Disposition: A | Discharge status: Home / Self Care | Payer: Medicare Other | Attending: Emergency Medicine | Admitting: Emergency Medicine

## 2022-04-21 DIAGNOSIS — I2699 Other pulmonary embolism without acute cor pulmonale: Secondary | ICD-10-CM | POA: Diagnosis not present

## 2022-04-21 DIAGNOSIS — T148XXA Other injury of unspecified body region, initial encounter: Secondary | ICD-10-CM

## 2022-04-21 DIAGNOSIS — T82838A Hemorrhage of vascular prosthetic devices, implants and grafts, initial encounter: Secondary | ICD-10-CM | POA: Diagnosis present

## 2022-04-21 DIAGNOSIS — Z7901 Long term (current) use of anticoagulants: Secondary | ICD-10-CM | POA: Diagnosis not present

## 2022-04-21 DIAGNOSIS — R58 Hemorrhage, not elsewhere classified: Secondary | ICD-10-CM

## 2022-04-21 DIAGNOSIS — N186 End stage renal disease: Secondary | ICD-10-CM | POA: Diagnosis not present

## 2022-04-21 DIAGNOSIS — I214 Non-ST elevation (NSTEMI) myocardial infarction: Secondary | ICD-10-CM | POA: Diagnosis not present

## 2022-04-21 DIAGNOSIS — Z992 Dependence on renal dialysis: Secondary | ICD-10-CM | POA: Diagnosis not present

## 2022-04-21 LAB — CBC
HCT: 39.4 % (ref 39.0–52.0)
Hemoglobin: 12.1 g/dL — ABNORMAL LOW (ref 13.0–17.0)
MCH: 28.6 pg (ref 26.0–34.0)
MCHC: 30.7 g/dL (ref 30.0–36.0)
MCV: 93.1 fL (ref 80.0–100.0)
Platelets: 285 10*3/uL (ref 150–400)
RBC: 4.23 MIL/uL (ref 4.22–5.81)
RDW: 17.3 % — ABNORMAL HIGH (ref 11.5–15.5)
WBC: 9.4 10*3/uL (ref 4.0–10.5)
nRBC: 0 % (ref 0.0–0.2)

## 2022-04-21 LAB — BASIC METABOLIC PANEL
Anion gap: 12 (ref 5–15)
BUN: 44 mg/dL — ABNORMAL HIGH (ref 8–23)
CO2: 24 mmol/L (ref 22–32)
Calcium: 7.3 mg/dL — ABNORMAL LOW (ref 8.9–10.3)
Chloride: 103 mmol/L (ref 98–111)
Creatinine, Ser: 5 mg/dL — ABNORMAL HIGH (ref 0.61–1.24)
GFR, Estimated: 12 mL/min — ABNORMAL LOW (ref >60)
Glucose, Bld: 103 mg/dL — ABNORMAL HIGH (ref 70–99)
Potassium: 4.2 mmol/L (ref 3.5–5.1)
Sodium: 139 mmol/L (ref 135–145)

## 2022-04-21 LAB — HEPARIN LEVEL (UNFRACTIONATED): Heparin Unfractionated: 0.16 IU/mL — ABNORMAL LOW (ref 0.30–0.70)

## 2022-04-21 LAB — HEPATITIS B SURFACE ANTIBODY, QUANTITATIVE: Hep B S AB Quant (Post): 28.9 m[IU]/mL (ref >9.9)

## 2022-04-21 MED ORDER — HYDRALAZINE HCL 25 MG PO TABS: 25.0000 mg | ORAL_TABLET | Freq: Three times a day (TID) | ORAL | 0 refills | Status: DC

## 2022-04-21 MED ORDER — HEPARIN BOLUS VIA INFUSION
1850.0000 [IU] | Freq: Once | INTRAVENOUS | Status: AC
  Administered 2022-04-21: 1850 [IU] via INTRAVENOUS
  Filled 2022-04-21: qty 1850

## 2022-04-21 MED ORDER — APIXABAN 5 MG PO TABS: 10.0000 mg | ORAL_TABLET | Freq: Two times a day (BID) | ORAL | 0 refills | Status: DC

## 2022-04-21 MED ORDER — APIXABAN 5 MG PO TABS: 5.0000 mg | ORAL_TABLET | Freq: Two times a day (BID) | ORAL | 0 refills | Status: DC

## 2022-04-21 MED ORDER — ATORVASTATIN CALCIUM 80 MG PO TABS: 80.0000 mg | ORAL_TABLET | Freq: Every day | ORAL | Status: DC

## 2022-04-21 MED ORDER — MUPIROCIN 2 % EX OINT: 1.0000 | TOPICAL_OINTMENT | Freq: Two times a day (BID) | CUTANEOUS | 0 refills | Status: AC

## 2022-04-21 MED ORDER — APIXABAN 5 MG PO TABS
10.0000 mg | ORAL_TABLET | Freq: Two times a day (BID) | ORAL | Status: DC
  Administered 2022-04-21: 10 mg via ORAL
  Filled 2022-04-21: qty 2

## 2022-04-21 MED ORDER — APIXABAN 5 MG PO TABS: 5.0000 mg | ORAL_TABLET | Freq: Two times a day (BID) | ORAL | Status: DC

## 2022-04-21 MED ORDER — ISOSORBIDE DINITRATE 10 MG PO TABS: 10.0000 mg | ORAL_TABLET | Freq: Three times a day (TID) | ORAL | 0 refills | Status: DC

## 2022-04-21 NOTE — Progress Notes (Signed)
Discharge instructions given to the patient.  Education emphasized on heart healthy diet, compliance with medications, and risk for bleeding.  Encouraged to call the doctor for questions.  Patient verbalized understanding.  Waiting for wife for transportation.

## 2022-04-21 NOTE — Plan of Care (Cosign Needed)
  [facility] CARDIOLOGY PLAN OF CARE  ASSESSMENT AND PLAN:  Anthony Hess is a 34yoM with a PMH of CAD s/p CABG x 4 (2020), HFrEF (20-25%, g1DD, mod MR, mi-mod AR 2021), ESRD (T,TH,S HD), PAD, hx tobacco use who presented to [facility] ED 04/18/22 with worsening dyspnea x 3 weeks and abdominal burning with an episode of vomiting the day prior to admission. Troponin elevated (467, 473) and CTA chest positive for LLL PE with small clot burden. Cardiology is consulted because of his elevated troponin, most c/w demand ischemia in the setting of PE.   # acute LLL PE Seen on CTA chest, small clot burden in LLL. Hemodynamically stable. Started on a heparin infusion, transitioning to eliquis per vascular  # CAD s/p CABG w/ atypical chest pain # demand ischemia Reports several weeks of worsening dyspnea on exertion + intermittent burning in his epigastrium, occasionally with exertion, sometimes relieved with eating or laying down to rest. Reproducible discomfort to palpation of his sternum + left chest wall, and epigastrium. Dissimilar to the L sharp discomfort he experienced Pre-CABG. EKG with nonspecific TW abnormality, similar to prior from 10/2021, HS trop elevated but flat trending 467, 473, 684 most consistent with demand/supply mismatch and not ACS.  - s/p 375m aspirin, continue aspirin 838mwhile on Eliquis as above, ok to stop plavix 7569maily  - continue atorva 58m40mily - continue metoprolol XL 25mg12mly.  - echo complete without rWMAs, actual improvement in EF compared to 58yrs 3yr- continue management of PE above and complete lexiscan myoview as scheduled outpatient on 3/4.  -will arrange for follow up in clinic with [name] following discharge and outpatient myoview  # chronic HF improved EF (55-60%, G1 DD 2/26/ 2024, previously 20-25%, g1DD, mod MR, mi-mod AR 2021)  Euvolmic on exam, BNP elevated at 843 on admission. Makes a little urine, not on diuretics on non-dialysis days.  -  continue GDMT with metoprolol, entresto 24-26mg d89m - continue isordil 10mg TI57mhydral 25mg TID81m afterload reduction - defer MRA, sglt2i with renal insufficiency - volume per dialysis    # ESRD on (T, TH, S HD)  Per nephrology   # tobacco abuse Recently restarted smoking 1/2 PPD. Strongly recommended complete cessation. He is motivated to quit, continue nicotine patches.  This patient's plan of care was discussed and created with Dr. ParaschosSaralyn Pilars in agreement.  Signed: Aletheia Tangredi MichTristan Schroeder/28/2024, 9:37 AM [facility]

## 2022-04-21 NOTE — Progress Notes (Addendum)
[facility]  ROUNDING NOTE   Subjective:   Gilman Cantey is a 67 year old male with past medical conditions including hypertension, PVD, dyslipidemia, CAD, four-vessel CABG, and end-stage renal disease on hemodialysis.  Patient presents to the emergency department with complaints of shortness of breath and has been admitted for Shortness of breath [R06.02] Generalized abdominal pain [R10.84] NSTEMI (non-ST elevated myocardial infarction) ([place]) [I21.4] Chest pain, unspecified type [R07.9] Acute pulmonary embolism, unspecified pulmonary embolism type, unspecified whether acute cor pulmonale present [facility]) [I26.99]  Patient is known to our practice from previous admissions and currently receives outpatient dialysis treatments at [facility] on a TTS schedule, supervised by Dr. Holley Raring.    Patient seen preparing to ambulate with mobility specialist.  States he feels well today, ready for discharge Dialysis yesterday, tolerated well  Objective:  Vital signs in last 24 hours:  Temp:  [97.7 F (36.5 C)-98.5 F (36.9 C)] 97.9 F (36.6 C) (02/28 1115) Pulse Rate:  [56-79] 64 (02/28 1115) Resp:  [14-18] 14 (02/28 1115) BP: (98-135)/(68-93) 98/68 (02/28 1115) SpO2:  [95 %-99 %] 98 % (02/28 1115) Weight:  [53.1 kg] 53.1 kg (02/27 1200)  Weight change:  Filed Weights   04/18/22 2331 04/20/22 0759 04/20/22 1200  Weight: 62.1 kg 52.4 kg 53.1 kg    Intake/Output: I/O last 3 completed shifts: In: 684.9 [P.O.:240; I.V.:444.9] Out: 1200 [Urine:200; Other:1000]   Intake/Output this shift:  Total I/O In: 256 [P.O.:240; I.V.:16] Out: -   Physical Exam: General: NAD  Head: Normocephalic, atraumatic. Moist oral mucosal membranes  Eyes: Anicteric  Lungs:  Clear to auscultation, normal effort  Heart: Regular rate and rhythm  Abdomen:  Soft, nontender  Extremities: No peripheral edema.  Neurologic: Alert and oriented, moving all four extremities  Skin: No lesions  Access:  Right PermCath    Basic Metabolic Panel: Recent Labs  Lab 04/18/22 2337 04/19/22 0409 04/19/22 2139 04/20/22 0807 04/21/22 0255  NA 141 135  --  138 139  K 5.4* 5.9* 3.9 4.7 4.2  CL 100 100  --  98 103  CO2 22 18*  --  25 24  GLUCOSE 97 94  --  85 103*  BUN 62* 62*  --  46* 44*  CREATININE 8.20* 8.30*  --  6.37* 5.00*  CALCIUM 7.1* 6.6*  --  6.9* 7.3*     Liver Function Tests: Recent Labs  Lab 04/18/22 2337 04/20/22 0806  AST 18  --   ALT 18  --   ALKPHOS 59  --   BILITOT 0.6  --   PROT 7.2  --   ALBUMIN 3.8 3.3*    Recent Labs  Lab 04/18/22 2337  LIPASE 56*    No results for input(s): "AMMONIA" in the last 168 hours.  CBC: Recent Labs  Lab 04/18/22 2337 04/19/22 0409 04/20/22 0807 04/21/22 0255  WBC 11.1* 13.9* 8.8 9.4  NEUTROABS 7.4  --   --   --   HGB 12.2* 11.7* 11.6* 12.1*  HCT 40.2 37.4* 38.3* 39.4  MCV 93.5 92.6 93.4 93.1  PLT 268 275 267 285     Cardiac Enzymes: No results for input(s): "CKTOTAL", "CKMB", "CKMBINDEX", "TROPONINI" in the last 168 hours.  BNP: Invalid input(s): "POCBNP"  CBG: No results for input(s): "GLUCAP" in the last 168 hours.  Microbiology: Results for orders placed or performed during the hospital encounter of 04/18/22  MRSA Next Gen by PCR, Nasal     Status: Abnormal   Collection Time: 04/19/22  1:25  PM   Specimen: Nasal Mucosa; Nasal Swab  Result Value Ref Range Status   MRSA by PCR Next Gen DETECTED (A) NOT DETECTED Final    Comment: RESULT CALLED TO, READ BACK BY AND VERIFIED WITH: JOY BEEMAN2/26/24 MW (NOTE) The GeneXpert MRSA Assay (FDA approved for NASAL specimens only), is one component of a comprehensive MRSA colonization surveillance program. It is not intended to diagnose MRSA infection nor to guide or monitor treatment for MRSA infections. Test performance is not FDA approved in patients less than 72 years old. Performed at [facility], [address] [place], [place] [address]      Coagulation Studies: No results for input(s): "LABPROT", "INR" in the last 72 hours.   Urinalysis: No results for input(s): "COLORURINE", "LABSPEC", "PHURINE", "GLUCOSEU", "HGBUR", "BILIRUBINUR", "KETONESUR", "PROTEINUR", "UROBILINOGEN", "NITRITE", "LEUKOCYTESUR" in the last 72 hours.  Invalid input(s): "APPERANCEUR"    Imaging: US Venous Img Lower Bilateral (DVT)  Result Date: 04/20/2022 CLINICAL DATA:  History of smoking. History of previous DVT. Evaluate for acute or chronic DVT. EXAM: BILATERAL LOWER EXTREMITY VENOUS DOPPLER ULTRASOUND TECHNIQUE: Gray-scale sonography with graded compression, as well as color Doppler and duplex ultrasound were performed to evaluate the lower extremity deep venous systems from the level of the common femoral vein and including the common femoral, femoral, profunda femoral, popliteal and calf veins including the posterior tibial, peroneal and gastrocnemius veins when visible. The superficial great saphenous vein was also interrogated. Spectral Doppler was utilized to evaluate flow at rest and with distal augmentation maneuvers in the common femoral, femoral and popliteal veins. COMPARISON:  Chest CT-04/19/2022 FINDINGS: RIGHT LOWER EXTREMITY Common Femoral Vein: No evidence of thrombus. Normal compressibility, respiratory phasicity and response to augmentation. Saphenofemoral Junction: No evidence of thrombus. Normal compressibility and flow on color Doppler imaging. Profunda Femoral Vein: No evidence of thrombus. Normal compressibility and flow on color Doppler imaging. Femoral Vein: No evidence of thrombus. Normal compressibility, respiratory phasicity and response to augmentation. Popliteal Vein: No evidence of thrombus. Normal compressibility, respiratory phasicity and response to augmentation. Calf Veins: No evidence of thrombus. Normal compressibility and flow on color Doppler imaging. Superficial Great Saphenous Vein: No evidence of thrombus. Normal  compressibility. Other Findings:  None. LEFT LOWER EXTREMITY Common Femoral Vein: No evidence of thrombus. Normal compressibility, respiratory phasicity and response to augmentation. Saphenofemoral Junction: No evidence of thrombus. Normal compressibility and flow on color Doppler imaging. Profunda Femoral Vein: No evidence of thrombus. Normal compressibility and flow on color Doppler imaging. Femoral Vein: No evidence of thrombus. Normal compressibility, respiratory phasicity and response to augmentation. Popliteal Vein: No evidence of thrombus. Normal compressibility, respiratory phasicity and response to augmentation. Calf Veins: No evidence of thrombus. Normal compressibility and flow on color Doppler imaging. Superficial Great Saphenous Vein: No evidence of thrombus. Normal compressibility. Other Findings:  None. IMPRESSION: No evidence of acute or chronic DVT within either lower extremity. Electronically Signed   By: Sandi Mariscal M.D.   On: 04/20/2022 15:37   ECHOCARDIOGRAM COMPLETE  Result Date: 04/19/2022    ECHOCARDIOGRAM REPORT   Patient Name:   TAIO BUSCHMAN Date of Exam: 04/19/2022 Medical Rec #:  [id number]    Height:       63.0 in Accession #:    [id number]   Weight:       137.0 lb Date of Birth:  Jun 18, 1955   BSA:          1.646 m Patient Age:    67 years     BP:  129/81 mmHg Patient Gender: M            HR:           71 bpm. Exam Location:  [facility] Procedure: 2D Echo, Cardiac Doppler and Color Doppler Indications:     NSTEMI  History:         Patient has prior history of Echocardiogram examinations, most                  recent 12/28/2019. CHF, Acute MI, Previous Myocardial Infarction                  and CAD, Prior CABG, Arrythmias:Atrial Fibrillation; Risk                  Factors:Hypertension, Current Smoker and Dyslipidemia.                  Pulmonary embolus, AAA.  Sonographer:     Wenda Low Referring Phys:  [id number] JAN A MANSY Diagnosing Phys: Kate Sable MD IMPRESSIONS  1.  Left ventricular ejection fraction, by estimation, is 55 to 60%. The left ventricle has normal function. The left ventricle has no regional wall motion abnormalities. There is moderate concentric left ventricular hypertrophy. Left ventricular diastolic parameters are consistent with Grade I diastolic dysfunction (impaired relaxation).  2. Right ventricular systolic function is normal. The right ventricular size is normal. There is normal pulmonary artery systolic pressure.  3. The mitral valve is normal in structure. No evidence of mitral valve regurgitation.  4. The aortic valve is tricuspid. Aortic valve regurgitation is not visualized. Aortic valve sclerosis/calcification is present, without any evidence of aortic stenosis.  5. The inferior vena cava is normal in size with greater than 50% respiratory variability, suggesting right atrial pressure of 3 mmHg. FINDINGS  Left Ventricle: Left ventricular ejection fraction, by estimation, is 55 to 60%. The left ventricle has normal function. The left ventricle has no regional wall motion abnormalities. The left ventricular internal cavity size was normal in size. There is  moderate concentric left ventricular hypertrophy. Left ventricular diastolic parameters are consistent with Grade I diastolic dysfunction (impaired relaxation). Right Ventricle: The right ventricular size is normal. No increase in right ventricular wall thickness. Right ventricular systolic function is normal. There is normal pulmonary artery systolic pressure. The tricuspid regurgitant velocity is 2.18 m/s, and  with an assumed right atrial pressure of 3 mmHg, the estimated right ventricular systolic pressure is [id number] mmHg. Left Atrium: Left atrial size was normal in size. Right Atrium: Right atrial size was normal in size. Pericardium: There is no evidence of pericardial effusion. Mitral Valve: The mitral valve is normal in structure. No evidence of mitral valve regurgitation. MV peak gradient, 6.4  mmHg. The mean mitral valve gradient is 2.0 mmHg. Tricuspid Valve: The tricuspid valve is normal in structure. Tricuspid valve regurgitation is trivial. Aortic Valve: The aortic valve is tricuspid. Aortic valve regurgitation is not visualized. Aortic valve sclerosis/calcification is present, without any evidence of aortic stenosis. Aortic valve mean gradient measures 6.0 mmHg. Aortic valve peak gradient measures 11.0 mmHg. Aortic valve area, by VTI measures 2.03 cm. Pulmonic Valve: The pulmonic valve was normal in structure. Pulmonic valve regurgitation is not visualized. Aorta: The aortic root is normal in size and structure. Venous: The inferior vena cava is normal in size with greater than 50% respiratory variability, suggesting right atrial pressure of 3 mmHg. IAS/Shunts: No atrial level shunt detected by color flow Doppler.  LEFT VENTRICLE PLAX 2D  LVIDd:         3.80 cm   Diastology LVIDs:         2.90 cm   LV e' medial:    5.44 cm/s LV PW:         1.70 cm   LV E/e' medial:  9.3 LV IVS:        1.40 cm   LV e' lateral:   12.20 cm/s LVOT diam:     1.90 cm   LV E/e' lateral: 4.2 LV SV:         62 LV SV Index:   38 LVOT Area:     2.84 cm  RIGHT VENTRICLE RV Basal diam:  4.10 cm RV Mid diam:    3.60 cm RV S prime:     6.96 cm/s LEFT ATRIUM             Index        RIGHT ATRIUM           Index LA diam:        3.90 cm 2.37 cm/m   RA Area:     14.60 cm LA Vol (A2C):   62.0 ml 37.66 ml/m  RA Volume:   30.80 ml  18.71 ml/m LA Vol (A4C):   31.5 ml 19.13 ml/m LA Biplane Vol: 44.0 ml 26.72 ml/m  AORTIC VALVE                     PULMONIC VALVE AV Area (Vmax):    2.17 cm      PV Vmax:       1.12 m/s AV Area (Vmean):   2.10 cm      PV Peak grad:  5.0 mmHg AV Area (VTI):     2.03 cm AV Vmax:           166.00 cm/s AV Vmean:          119.000 cm/s AV VTI:            0.307 m AV Peak Grad:      11.0 mmHg AV Mean Grad:      6.0 mmHg LVOT Vmax:         127.00 cm/s LVOT Vmean:        88.000 cm/s LVOT VTI:          0.220 m  LVOT/AV VTI ratio: 0.72  AORTA Ao Root diam: 3.60 cm MITRAL VALVE               TRICUSPID VALVE MV Area (PHT): 2.70 cm    TR Peak grad:   19.0 mmHg MV Area VTI:   2.00 cm    TR Vmax:        218.00 cm/s MV Peak grad:  6.4 mmHg MV Mean grad:  2.0 mmHg    SHUNTS MV Vmax:       1.26 m/s    Systemic VTI:  0.22 m MV Vmean:      58.5 cm/s   Systemic Diam: 1.90 cm MV Decel Time: 281 msec MV E velocity: 50.80 cm/s MV A velocity: 68.10 cm/s MV E/A ratio:  0.75 Kate Sable MD Electronically signed by Kate Sable MD Signature Date/Time: 04/19/2022/4:02:54 PM    Final      Medications:      apixaban  10 mg Oral BID   Followed by   Derrill Memo ON 04/28/2022] apixaban  5 mg Oral BID   aspirin EC  81 mg Oral Daily  atorvastatin  80 mg Oral Daily   calcitRIOL  0.5 mcg Oral Daily   calcium acetate  667 mg Oral TID WC   Chlorhexidine Gluconate Cloth  6 each Topical Q0600   hydrALAZINE  25 mg Oral TID   isosorbide dinitrate  10 mg Oral TID   metoprolol succinate  25 mg Oral Daily   multivitamin  1 tablet Oral QHS   mupirocin ointment  1 Application Nasal BID   nicotine  14 mg Transdermal Daily   pantoprazole  40 mg Oral QHS   sacubitril-valsartan  1 tablet Oral BID   sucroferric oxyhydroxide  500 mg Oral TID WC   Vitamin D (Ergocalciferol)  50,000 Units Oral Q7 days   acetaminophen, ALPRAZolam, hydrALAZINE, labetalol, magnesium hydroxide, nitroGLYCERIN, ondansetron (ZOFRAN) IV, traZODone  Assessment/ Plan:  Mr. Camarie Regen is a 67 y.o.  male  Datron Lopiano is a 67 year old male with past medical conditions including hypertension, PVD, dyslipidemia, CAD, four-vessel CABG, and end-stage renal disease on hemodialysis.  Patient presents to the emergency department with complaints of shortness of breath and has been admitted for Shortness of breath [R06.02] Generalized abdominal pain [R10.84] NSTEMI (non-ST elevated myocardial infarction) ([place]) [I21.4] Chest pain, unspecified type [R07.9] Acute  pulmonary embolism, unspecified pulmonary embolism type, unspecified whether acute cor pulmonale present ([place]) [I26.99]  [facility]/TTS/right PermCath.  End-stage renal disease with hyperkalemia on hemodialysis.    Dialysis received yesterday, UF 1L achieved.  Next treatment scheduled for Thursday.   2. Anemia of chronic kidney disease Lab Results  Component Value Date   HGB 12.1 (L) 04/21/2022    Hemoglobin within desired range. No need for ESA's at this time.  3. Secondary Hyperparathyroidism: with outpatient labs: PTH 532, phosphorus 10.7, calcium 6.1 on 02/11/22.  :  Lab Results  Component Value Date   PTH 159 (H) 12/27/2019   CALCIUM 7.3 (L) 04/21/2022   CAION 0.84 (LL) 09/11/2021   PHOS 7.4 (H) 11/17/2021    Hypocalcemia/hyperphosphatemia-continue daily calcitriol, weekly ergocalciferol, and calcium acetate with meals.  4.  Hypertension with chronic kidney disease.  Home regimen includes amlodipine, clonidine, metoprolol, Entresto and losartan.  Currently receiving hydralazine, isosorbide, metoprolol, and Entresto.  Blood pressure 98/68  5.  Acute pulmonary embolism.  Patient currently on heparin drip with echo showing EF 55 to 60% with moderate LVH and grade 1 diastolic dysfunction.   LOS: 2 [place] 2/28/202411:50 AM

## 2022-04-21 NOTE — Consult Note (Signed)
Hospital Consult    Reason for Consult:  Anticoagulation  Requesting Physician:  Dr Nicole Kindred MD MRN #:  [id number]  History of Present Illness: This is a 67 y.o. male  with a PMH of CAD s/p CABG x 4 (2020), HFrEF (20-25%, g1DD, mod MR, mi-mod AR 2021), ESRD (T,TH,S HD), PAD, hx tobacco use who presented to [facility] ED 04/18/22 with worsening dyspnea x 3 weeks and abdominal burning with an episode of vomiting the day prior to admission. Troponin elevated (467, 473) and CTA chest positive for LLL PE with small clot burden. Cardiology is consulted because of his elevated troponin, most c/w demand ischemia in the setting of PE.   Past Medical History:  Diagnosis Date   Aortic atherosclerosis ([place])    Bilateral carotid artery disease ([place])    Bladder cancer ([place])    Coronary artery disease 12/20/2018   a.) LHC 12/20/2018: 50% OM1, 40% OM2, 95% o-pLAD, 75% o=pLCx, 40% mLM, 70% D1, 60% mRCA-1, 50% mRCA-2; refer to CVTS. b.) 4v CABG at [facility] on 12/27/2018: LIMA-LAD, RIMA-PDA, seg LRA-OM1-D1   DCM (dilated cardiomyopathy) ([place]) 12/05/2018   a.) TTE 12/05/2018: EF 40-45%. b.) TTE 12/28/2019: EF 20-25%.   ESRD (end stage renal disease) ([place])    a.) T-Th-Sat   HFrEF (heart failure with reduced ejection fraction) ([place]) 12/05/2018   a.) TTE 12/05/2018: EF 40-45%; mild LVH; ant/apical/sep HK; mild TR . b.) TTE 12/28/2019: EF 20-25%; mod LVH; mod MR/AR; G1DD.   History of 2019 novel coronavirus disease (COVID-19) 04/08/2021   History of kidney stones    HLD (hyperlipidemia)    Hypertension    Infrarenal abdominal aortic aneurysm (AAA) without rupture ([place]) 03/05/2021   a.) CT abd/pelvis; measured 3.2 cm.   Myocardial infarction [facility])    PVD (peripheral vascular disease) ([place])    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   a.) LIMA-LAD, RIMA-PDA, sequential LEFT radial artery to OM1 and D1   Sepsis ([place]) 03/14/2021   Wears glasses     Past Surgical History:  Procedure Laterality Date   AV FISTULA PLACEMENT Left  07/30/2021   Procedure: INSERTION OF ARTERIOVENOUS (AV) GORE-TEX GRAFT ARM BRACHIAL ARTERY TO AXILLARY VEIN;  Surgeon: Algernon Huxley, MD;  Location: [facility] OR[facility];  Service: Vascular;  Laterality: Left;   CAPD INSERTION N/A 12/31/2019   Procedure: LAPAROSCOPIC INSERTION CONTINUOUS AMBULATORY PERITONEAL DIALYSIS  (CAPD) CATHETER;  Surgeon: Jules Husbands, MD;  Location: [facility];  Service: General;  Laterality: N/A;   CAPD REMOVAL N/A 04/10/2020   Procedure: LAPAROSCOPIC REVISION OF CONTINUOUS AMBULATORY PERITONEAL DIALYSIS  (CAPD) CATHETER;  Surgeon: Jules Husbands, MD;  Location: [facility];  Service: General;  Laterality: N/A;   CORONARY ARTERY BYPASS GRAFT N/A 12/27/2018   Procedure: CORONARY ARTERY BYPASS GRAFTING (CABG) X 4 ON PUMP USING RIGHT & LEFT INTERNAL MAMMARY ARTERY LEFT RADIAL ARTERY ENDOSCOPICALLY HARVESTED;  Surgeon: [name], MD;  Location: [place];  Service: Open Heart Surgery;  Laterality: N/A;   CYSTOSCOPY W/ RETROGRADES Bilateral 05/15/2019   Procedure: CYSTOSCOPY WITH RETROGRADE PYELOGRAM;  Surgeon: Abbie Sons, MD;  Location: [facility];  Service: Urology;  Laterality: Bilateral;   CYSTOSCOPY WITH BIOPSY N/A 05/15/2019   Procedure: CYSTOSCOPY WITH bladder BIOPSY;  Surgeon: Abbie Sons, MD;  Location: [facility];  Service: Urology;  Laterality: N/A;   DIALYSIS/PERMA CATHETER INSERTION N/A 12/28/2019   Procedure: DIALYSIS/PERMA CATHETER INSERTION;  Surgeon: Algernon Huxley, MD;  Location: [place] CV [facility];  Service: Cardiovascular;  Laterality: N/A;  DIALYSIS/PERMA CATHETER INSERTION N/A 03/18/2021   Procedure: DIALYSIS/PERMA CATHETER INSERTION;  Surgeon: Algernon Huxley, MD;  Location: [place] CV LAB;  Service: Cardiovascular;  Laterality: N/A;   DIALYSIS/PERMA CATHETER REMOVAL N/A 06/02/2020   Procedure: DIALYSIS/PERMA CATHETER REMOVAL;  Surgeon: Algernon Huxley, MD;  Location: [place] CV LAB;  Service: Cardiovascular;  Laterality: N/A;   EXCHANGE OF A DIALYSIS CATHETER  Right 04/10/2020   Procedure: EXCHANGE OF A DIALYSIS CATHETER;  Surgeon: Jules Husbands, MD;  Location: [facility];  Service: General;  Laterality: Right;   [place]  01/20/2021   Procedure: HERNIA REPAIR INCISIONAL;  Surgeon: Olean Ree, MD;  Location: [facility];  Service: General;;   IR IMAGE GUIDED DRAINAGE PERCUT CATH  PERITONEAL RETROPERIT  04/07/2020   LAPAROSCOPY N/A 04/16/2021   Procedure: LAPAROSCOPY DIAGNOSTIC;  Surgeon: Benjamine Sprague, DO;  Location: [facility];  Service: General;  Laterality: N/A;   LEFT HEART CATH AND CORONARY ANGIOGRAPHY Left 12/20/2018   Procedure: LEFT HEART CATH AND CORONARY ANGIOGRAPHY;  Surgeon: Isaias Cowman, MD;  Location: [place] CV LAB;  Service: Cardiovascular;  Laterality: Left;   RADIAL ARTERY HARVEST Left 12/27/2018   Procedure: ENDOSCOPIC RADIAL ARTERY HARVEST;  Surgeon: [name], MD;  Location: [place];  Service: Open Heart Surgery;  Laterality: Left;   REMOVAL OF A DIALYSIS CATHETER Left 03/20/2021   Procedure: REMOVAL OF A PD CATHETER;  Surgeon: Algernon Huxley, MD;  Location: [facility];  Service: Vascular;  Laterality: Left;   REVISION OF ARTERIOVENOUS GORETEX GRAFT Left 09/11/2021   Procedure: Excisionof infected AV graft;  Surgeon: Katha Cabal, MD;  Location: [facility];  Service: Vascular;  Laterality: Left;   TEE WITHOUT CARDIOVERSION N/A 12/27/2018   Procedure: TRANSESOPHAGEAL ECHOCARDIOGRAM (TEE);  Surgeon: [name], MD;  Location: [place];  Service: Open Heart Surgery;  Laterality: N/A;   TUMOR REMOVAL  2019   Bladder    No Known Allergies  Prior to Admission medications   Medication Sig Start Date End Date Taking? Authorizing Provider  amLODipine (NORVASC) 10 MG tablet Take 1 tablet (10 mg total) by mouth daily. 11/18/21  Yes Emeterio Reeve, DO  atorvastatin (LIPITOR) 80 MG tablet Take 0.5 tablets (40 mg total) by mouth daily. Patient taking differently: Take 80 mg by mouth daily. 11/18/21  Yes  Emeterio Reeve, DO  calcitRIOL (ROCALTROL) 0.5 MCG capsule Take 1 capsule (0.5 mcg total) by mouth daily. 11/18/21  Yes Emeterio Reeve, DO  calcium acetate (PHOSLO) 667 MG capsule Take 1 capsule (667 mg total) by mouth 3 (three) times daily with meals. 11/18/21  Yes Emeterio Reeve, DO  cloNIDine (CATAPRES) 0.2 MG tablet Take 0.2 mg by mouth 2 (two) times daily. 02/13/22  Yes [provider]  metoprolol succinate (TOPROL-XL) 25 MG 24 hr tablet Take 25 mg by mouth daily. 03/12/22  Yes [provider]  multivitamin (RENA-VIT) TABS tablet Take 1 tablet by mouth at bedtime. 03/23/21  Yes Ezekiel Slocumb, DO  sucroferric oxyhydroxide (VELPHORO) 500 MG chewable tablet Chew 1 tablet (500 mg total) by mouth 3 (three) times daily with meals. 11/18/21  Yes Emeterio Reeve, DO  Vitamin D, Ergocalciferol, (DRISDOL) 1.25 MG (50000 UNIT) CAPS capsule Take 1 capsule (50,000 Units total) by mouth every 7 (seven) days. 11/18/21  Yes Emeterio Reeve, DO  acetaminophen (TYLENOL) 500 MG tablet Take 2 tablets (1,000 mg total) by mouth every 6 (six) hours as needed for mild pain, fever or headache (or Fever >/= 101).  11/18/21   Emeterio Reeve, DO  aspirin EC 81 MG tablet Take 1 tablet (81 mg total) by mouth daily. Swallow whole. Patient not taking: Reported on 04/19/2022 11/19/21   Emeterio Reeve, DO  clopidogrel (PLAVIX) 75 MG tablet Take 75 mg by mouth daily. Patient not taking: Reported on 04/19/2022    [provider]  losartan (COZAAR) 100 MG tablet Take 100 mg by mouth daily. Patient not taking: Reported on 04/19/2022 12/29/21   [provider]  sacubitril-valsartan (ENTRESTO) 24-26 MG Take 1 tablet by mouth 2 (two) times daily. Patient not taking: Reported on 04/19/2022    [provider]    Social History   Socioeconomic History   Marital status: Married    Spouse name: Not on file   Number of children: Not on file   Years of education: Not on  file   Highest education level: Not on file  Occupational History   Not on file  Tobacco Use   Smoking status: Former    Packs/day: 0.25    Types: Cigarettes    Quit date: 02/19/2019    Years since quitting: 3.1   Smokeless tobacco: Never  Vaping Use   Vaping Use: Never used  Substance and Sexual Activity   Alcohol use: Never   Drug use: Never   Sexual activity: Yes  Other Topics Concern   Not on file  Social History Narrative   Not on file   Social Determinants of Health   Financial Resource Strain: Not on file  Food Insecurity: No Food Insecurity (04/19/2022)   Hunger Vital Sign    Worried About Running Out of Food in the Last Year: Never true    Ran Out of Food in the Last Year: Never true  Transportation Needs: No Transportation Needs (04/19/2022)   PRAPARE - Hydrologist (Medical): No    Lack of Transportation (Non-Medical): No  Physical Activity: Not on file  Stress: Not on file  Social Connections: Not on file  Intimate Partner Violence: Not At Risk (04/19/2022)   Humiliation, Afraid, Rape, and Kick questionnaire    Fear of Current or Ex-Partner: No    Emotionally Abused: No    Physically Abused: No    Sexually Abused: No     Family History  Family history unknown: Yes    ROS: Otherwise negative unless mentioned in HPI  Physical Examination  Vitals:   04/21/22 0458 04/21/22 0735  BP: (!) 135/92 115/77  Pulse: (!) 56 62  Resp: 16 15  Temp: 98.4 F (36.9 C) 97.7 F (36.5 C)  SpO2: 97% 98%   Body mass index is 20.74 kg/m.  General:  WDWN in NAD Gait: Not observed HENT: WNL, normocephalic Pulmonary: normal non-labored breathing, without Rales, rhonchi,  wheezing Cardiac: regular, without  Murmurs, rubs or gallops; without carotid bruits Abdomen: Positive bowel sounds, soft, NT/ND, no masses Skin: without rashes Vascular Exam/Pulses: Palpable radial pulses non palpable PT/DP pulses Extremities: without ischemic  changes, without Gangrene , without cellulitis; without open wounds;  Musculoskeletal: no muscle wasting or atrophy  Neurologic: A&O X 3;  No focal weakness or paresthesias are detected; speech is fluent/normal Psychiatric:  The pt has Normal affect. Lymph:  Unremarkable  CBC    Component Value Date/Time   WBC 9.4 04/21/2022 0255   RBC 4.23 04/21/2022 0255   HGB 12.1 (L) 04/21/2022 0255   HGB 9.1 (L) 01/04/2020 1540   HCT 39.4 04/21/2022 0255   HCT 29.6 (L)  01/04/2020 1540   PLT 285 04/21/2022 0255   PLT 191 01/04/2020 1540   MCV 93.1 04/21/2022 0255   MCV 94 01/04/2020 1540   MCH 28.6 04/21/2022 0255   MCHC 30.7 04/21/2022 0255   RDW 17.3 (H) 04/21/2022 0255   RDW 16.2 (H) 01/04/2020 1540   LYMPHSABS 1.5 04/18/2022 2337   LYMPHSABS 1.0 01/04/2020 1540   MONOABS 1.6 (H) 04/18/2022 2337   EOSABS 0.6 (H) 04/18/2022 2337   EOSABS 0.5 (H) 03/27/2018 1643   BASOSABS 0.0 04/18/2022 2337   BASOSABS 0.0 03/27/2018 1643    BMET    Component Value Date/Time   NA 139 04/21/2022 0255   NA 143 01/04/2020 1542   K 4.2 04/21/2022 0255   CL 103 04/21/2022 0255   CO2 24 04/21/2022 0255   GLUCOSE 103 (H) 04/21/2022 0255   BUN 44 (H) 04/21/2022 0255   BUN 52 (H) 01/04/2020 1542   CREATININE 5.00 (H) 04/21/2022 0255   CREATININE 4.62 (H) 01/10/2019 1351   CALCIUM 7.3 (L) 04/21/2022 0255   GFRNONAA 12 (L) 04/21/2022 0255   GFRAA 15 (L) 01/04/2020 1542    COAGS: Lab Results  Component Value Date   INR 1.0 04/18/2022   INR 1.0 11/16/2021   INR 1.7 (H) 03/14/2021     Non-Invasive Vascular Imaging:   EXAM: CT ABDOMEN AND PELVIS WITH CONTRAST  IMPRESSION: 1. Acute pulmonary embolism. The embolic burden is small. No CT evidence of right heart strain. 2. Left ventricular hypertrophy. 3. Moderate emphysema. 4. No acute intra-abdominal pathology identified. 5. Marked bilateral renal atrophy in keeping with chronic renal failure. 6. Extensive aortoiliac atherosclerotic  calcification.  Statin:  Yes.   Beta Blocker:  Yes.   Aspirin:  Yes.   ACEI:  No. ARB:  No. CCB use:  Yes Other antiplatelets/anticoagulants:  Yes.   ASA and Plavix   ASSESSMENT/PLAN: This is a 67 y.o. male with an extensive cardiac history and vascular history who presents to the hospital with increased shortness of breath found now to have a pulmonary embolism. Question was asked about possible procedure and anticoagulation therapy.  At this time the pulmonary embolism is small and does not require a pulmonary thrombectomy. There is no right heart strain noted. The patient was placed on DAPT by cardiology and will now need to be on Eliquis as well for the treatment of the pulmonary embolism. Therefore Vascular Surgery recommend the patient be placed on 1 antiplatelet therapy with the Eliquis. ASA 81 mg with Eliquis 5 mg BID or Plavix 75 mg and Eliquis 5 mg BID. We ask that the primary team please consult his cardiologist as to how they would like to proceed with his anticoagulation therapy.     -Plan was discussed with Dr Ella Jubilee MD and he agrees with the plan.    Drema Pry [facility] 04/21/2022 9:06 AM

## 2022-04-21 NOTE — Progress Notes (Signed)
ANTICOAGULATION CONSULT NOTE  Pharmacy Consult for Heparin  Indication: ACS/STEMI  No Known Allergies  Patient Measurements: Height: '5\' 3"'$  (160 cm) Weight: 53.1 kg (117 lb 1 oz) IBW/kg (Calculated) : 56.9 Heparin Dosing Weight: 62.1 kg  Vital Signs: Temp: 98.4 F (36.9 C) (02/28 0458) Temp Source: Oral (02/28 0458) BP: 135/92 (02/28 0458) Pulse Rate: 56 (02/28 0458)  Labs: Recent Labs    04/18/22 2337 04/19/22 0132 04/19/22 0409 04/19/22 1346 04/20/22 0025 04/20/22 0807 04/20/22 1218 04/20/22 2117 04/21/22 0255  HGB 12.2*  --  11.7*  --   --  11.6*  --   --  12.1*  HCT 40.2  --  37.4*  --   --  38.3*  --   --  39.4  PLT 268  --  275  --   --  267  --   --  285  HEPARINUNFRC  --   --   --  0.12*   < >  --  0.31 <0.10* 0.16*  CREATININE 8.20*  --  8.30*  --   --  6.37*  --   --  5.00*  TROPONINIHS 467* 473*  --  684*  --   --   --   --   --    < > = values in this interval not displayed.     Estimated Creatinine Clearance: 11.2 mL/min (A) (by C-G formula based on SCr of 5 mg/dL (H)).   Medical History: Past Medical History:  Diagnosis Date   Aortic atherosclerosis ([place])    Bilateral carotid artery disease ([place])    Bladder cancer ([place])    Coronary artery disease 12/20/2018   a.) LHC 12/20/2018: 50% OM1, 40% OM2, 95% o-pLAD, 75% o=pLCx, 40% mLM, 70% D1, 60% mRCA-1, 50% mRCA-2; refer to CVTS. b.) 4v CABG at [facility] on 12/27/2018: LIMA-LAD, RIMA-PDA, seg LRA-OM1-D1   DCM (dilated cardiomyopathy) ([place]) 12/05/2018   a.) TTE 12/05/2018: EF 40-45%. b.) TTE 12/28/2019: EF 20-25%.   ESRD (end stage renal disease) ([place])    a.) T-Th-Sat   HFrEF (heart failure with reduced ejection fraction) ([place]) 12/05/2018   a.) TTE 12/05/2018: EF 40-45%; mild LVH; ant/apical/sep HK; mild TR . b.) TTE 12/28/2019: EF 20-25%; mod LVH; mod MR/AR; G1DD.   History of 2019 novel coronavirus disease (COVID-19) 04/08/2021   History of kidney stones    HLD (hyperlipidemia)    Hypertension     Infrarenal abdominal aortic aneurysm (AAA) without rupture ([place]) 03/05/2021   a.) CT abd/pelvis; measured 3.2 cm.   Myocardial infarction [facility])    PVD (peripheral vascular disease) ([place])    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   a.) LIMA-LAD, RIMA-PDA, sequential LEFT radial artery to OM1 and D1   Sepsis ([place]) 03/14/2021   Wears glasses     Assessment: Pt is a 67 yo male presenting to ED c/o SOB, found with acute LLL PE and atypical chest pain. PMH includes CAD s/p CABG, chronic HFrEF (EF 20-25%), ESRD HD T/Th/Sat. Pt has a small clot burden in LLL.   2/27 0025 HL 0.12  2/27 1218 HL 0.31  2/27 2117 HL 0.<0.1 2/28 0255 HL 0.16  Goal of Therapy:  Heparin level 0.3-0.7 units/ml Monitor platelets by anticoagulation protocol: Yes  Plan:  2/28:  HL @ 0255 = 0.16, SUBtherapeutic - Will order heparin 1850 units IV X 1 bolus and increase drip rate to 1600 units/hr. - Will recheck HL 8 hrs after rate change CBC daily  Jeramey Lanuza D, PharmD Clinical Pharmacist  04/21/2022 6:59 AM

## 2022-04-21 NOTE — Plan of Care (Addendum)
Patient Anthony Hess, VSS throughout shift.  All meds given on time as ordered.  Heparin gtt on-going in right wrist now.  Heparin infiltrated in left arm, PIV removed, dressing applied.  Warm compress applied to left arm per pharmacy recommendation.  Pt standby assist to bathroom.  Diminished lungs, IS encouraged.  POC maintained, will continue to monitor.  Problem: Education: Goal: Understanding of cardiac disease, CV risk reduction, and recovery process will improve Outcome: Progressing Goal: Individualized Educational Video(s) Outcome: Progressing   Problem: Activity: Goal: Ability to tolerate increased activity will improve Outcome: Progressing   Problem: Cardiac: Goal: Ability to achieve and maintain adequate cardiovascular perfusion will improve Outcome: Progressing   Problem: Health Behavior/Discharge Planning: Goal: Ability to safely manage health-related needs after discharge will improve Outcome: Progressing   Problem: Education: Goal: Knowledge of General Education information will improve Description: Including pain rating scale, medication(s)/side effects and non-pharmacologic comfort measures Outcome: Progressing   Problem: Health Behavior/Discharge Planning: Goal: Ability to manage health-related needs will improve Outcome: Progressing   Problem: Clinical Measurements: Goal: Ability to maintain clinical measurements within normal limits will improve Outcome: Progressing Goal: Will remain free from infection Outcome: Progressing Goal: Diagnostic test results will improve Outcome: Progressing Goal: Respiratory complications will improve Outcome: Progressing Goal: Cardiovascular complication will be avoided Outcome: Progressing   Problem: Activity: Goal: Risk for activity intolerance will decrease Outcome: Progressing   Problem: Nutrition: Goal: Adequate nutrition will be maintained Outcome: Progressing   Problem: Coping: Goal: Level of anxiety will  decrease Outcome: Progressing   Problem: Elimination: Goal: Will not experience complications related to bowel motility Outcome: Progressing Goal: Will not experience complications related to urinary retention Outcome: Progressing   Problem: Pain Managment: Goal: General experience of comfort will improve Outcome: Progressing   Problem: Safety: Goal: Ability to remain free from injury will improve Outcome: Progressing   Problem: Skin Integrity: Goal: Risk for impaired skin integrity will decrease Outcome: Progressing

## 2022-04-21 NOTE — ED Provider Notes (Signed)
[facility] Provider Note   Event Date/Time   First MD Initiated Contact with Patient 04/21/22 1750     (approximate) History  bleeding from IV site  HPI Anthony Hess is a 67 y.o. male with stated past medical history DVT and PEs recently diagnosed and discharged yesterday on Eliquis who presents for continued bleeding from his IV site on the right wrist.  Patient states that it has been bleeding through multiple bandages since being discharged.  Patient is concerned as he is continuing to take his blood thinning medicine.  Patient denies any chest pain, shortness of breath, dyspnea on exertion, or orthostatic lightheadedness. ROS: Patient currently denies any vision changes, tinnitus, difficulty speaking, facial droop, sore throat, chest pain, shortness of breath, abdominal pain, nausea/vomiting/diarrhea, dysuria, or weakness/numbness/paresthesias in any extremity   Physical Exam  Triage Vital Signs: ED Triage Vitals  Enc Vitals Group     BP 04/21/22 1639 (!) 119/96     Pulse Rate 04/21/22 1639 73     Resp 04/21/22 1639 16     Temp 04/21/22 1639 98.1 F (36.7 C)     Temp Source 04/21/22 1639 Oral     SpO2 04/21/22 1639 95 %     Weight 04/21/22 1640 116 lb 13.5 oz (53 kg)     Height 04/21/22 1640 '5\' 3"'$  (1.6 m)     Head Circumference --      Peak Flow --      Pain Score 04/21/22 1640 0     Pain Loc --      Pain Edu? --      Excl. in [place]? --    Most recent vital signs: Vitals:   04/21/22 1639 04/21/22 1851  BP: (!) 119/96 (!) 155/110  Pulse: 73 70  Resp: 16 18  Temp: 98.1 F (36.7 C)   SpO2: 95% 96%   General: Awake, oriented x4. CV:  Good peripheral perfusion.  Resp:  Normal effort.  Abd:  No distention.  Other:  Elderly Caucasian male laying in bed in no acute distress.  Bandage in place to right wrist with oozing blood from peripheral IV site on the dorsum of the right wrist. ED Results / Procedures / Treatments   PROCEDURES: Critical Care  performed: No Procedures MEDICATIONS ORDERED IN ED: Medications - No data to display IMPRESSION / MDM / [place] / ED COURSE  I reviewed the triage vital signs and the nursing notes.                             The patient is on the cardiac monitor to evaluate for evidence of arrhythmia and/or significant heart rate changes. Patient's presentation is most consistent with acute presentation with potential threat to life or bodily function. Patient is 67 year old male who presents for continued bleeding from his peripheral IV site after being discharged yesterday from hospitalization due to DVT and PE.  Bleeding controlled with Surgicel over the site and Curlex dressing.  Patient encouraged to change dressing only after 24 hours but continue taking his Eliquis for the time being.  I am aware that this continued bleeding was likely secondary to his heparin infusion as well as being transitioned to Eliquis.  Patient encouraged to follow-up with his primary care physician in the next 1 to 3 days for further evaluation and management.  Dispo: Discharge home   FINAL CLINICAL IMPRESSION(S) / ED DIAGNOSES   Final diagnoses:  Excessive bleeding  Bleeding from wound   Rx / DC Orders   ED Discharge Orders     None      Note:  This document was prepared using Dragon voice recognition software and may include unintentional dictation errors.   Naaman Plummer, MD 04/21/22 2322

## 2022-04-21 NOTE — ED Triage Notes (Signed)
Pt to ED states just left this ED a couple hrs ago and that was given Eliquis today for blood clot, and that IV site has not stopped bleeding since left here. IV site is to R hand. Was dripping blood upon arrival but has pressure dressing applied by first nurse at this time.

## 2022-04-21 NOTE — Discharge Summary (Signed)
Physician Discharge Summary   Patient: Anthony Hess MRN: [id number] DOB: 1955-04-27  Admit date:     04/18/2022  Discharge date: 04/21/22  Discharge Physician: Jennye Boroughs   PCP: Patient, No Pcp Per   Recommendations at discharge:   Follow-up with PCP in 1 week Continue outpatient hemodialysis on Tuesdays, Thursdays and Saturdays  Discharge Diagnoses: Principal Problem:   NSTEMI (non-ST elevated myocardial infarction) [facility]) Active Problems:   Acute pulmonary embolism ([place])   Hypertensive urgency   Chronic combined systolic and diastolic CHF (congestive heart failure) ([place])   Dyslipidemia   End-stage renal disease on hemodialysis ([place])   Tobacco dependence  Resolved Problems:   * No resolved hospital problems. [facility] Course:  [name] is a 67 y.o. male with medical history significant for coronary artery disease, status post 4-vessel CABG, end-stage renal disease on hemodialysis on TTS, heart failure with reduced EF, hypertension, dyslipidemia, and peripheral vascular disease, who presented to the emergency department with shortness of breath of about 3 weeks duration.  He also reported chest discomfort.   He was found to have acute pulmonary embolism and elevated troponins.  Elevated troponins were attributed to demand ischemia/type II NSTEMI.  He was treated with IV heparin drip and was subsequently switched to Eliquis for long-term anticoagulation.  He understands that Eliquis is associated with increased risk of bleeding.  He was previously on dual antiplatelet therapy with aspirin and Plavix.  Cardiologist recommended that Plavix be discontinued.  He had hypertensive urgency on admission but this has improved.  He was seen by the nephrologist for ESRD and hyperkalemia.  He underwent hemodialysis and hyperkalemia has improved.  He complained of bruising/swelling on the left forearm from peripheral IV access.  Compression dressing was applied.  Dressing was dry,  clean and intact.  He was to be discharged as soon as possible because he has to go to work at 4 PM today.  He is deemed stable for discharge.  Discharge plan was discussed with cardiology team and vascular surgery team (Dr. Delana Meyer as attending).       Consultants: Cardiologist, vascular surgeon, nephrologist Procedures performed: None Disposition: Home Diet recommendation:  Discharge Diet Orders (From admission, onward)     Start     Ordered   04/21/22 0000  Diet - low sodium heart healthy        04/21/22 1157           Cardiac diet DISCHARGE MEDICATION: Allergies as of 04/21/2022   No Known Allergies      Medication List     STOP taking these medications    amLODipine 10 MG tablet Commonly known as: NORVASC   cloNIDine 0.2 MG tablet Commonly known as: CATAPRES   clopidogrel 75 MG tablet Commonly known as: PLAVIX   losartan 100 MG tablet Commonly known as: COZAAR       TAKE these medications    acetaminophen 500 MG tablet Commonly known as: TYLENOL Take 2 tablets (1,000 mg total) by mouth every 6 (six) hours as needed for mild pain, fever or headache (or Fever >/= 101).   apixaban 5 MG Tabs tablet Commonly known as: ELIQUIS Take 2 tablets (10 mg total) by mouth 2 (two) times daily for 7 days.   apixaban 5 MG Tabs tablet Commonly known as: ELIQUIS Take 1 tablet (5 mg total) by mouth 2 (two) times daily. Start taking on: April 28, 2022   aspirin EC 81 MG tablet Take 1 tablet (81 mg total)  by mouth daily. Swallow whole.   atorvastatin 80 MG tablet Commonly known as: LIPITOR Take 1 tablet (80 mg total) by mouth daily.   calcitRIOL 0.5 MCG capsule Commonly known as: ROCALTROL Take 1 capsule (0.5 mcg total) by mouth daily.   calcium acetate 667 MG capsule Commonly known as: PHOSLO Take 1 capsule (667 mg total) by mouth 3 (three) times daily with meals.   Entresto 24-26 MG Generic drug: sacubitril-valsartan Take 1 tablet by mouth 2 (two) times  daily.   hydrALAZINE 25 MG tablet Commonly known as: APRESOLINE Take 1 tablet (25 mg total) by mouth 3 (three) times daily.   isosorbide dinitrate 10 MG tablet Commonly known as: ISORDIL Take 1 tablet (10 mg total) by mouth 3 (three) times daily.   metoprolol succinate 25 MG 24 hr tablet Commonly known as: TOPROL-XL Take 25 mg by mouth daily.   multivitamin Tabs tablet Take 1 tablet by mouth at bedtime.   mupirocin ointment 2 % Commonly known as: BACTROBAN Place 1 Application into the nose 2 (two) times daily for 3 days.   Velphoro 500 MG chewable tablet Generic drug: sucroferric oxyhydroxide Chew 1 tablet (500 mg total) by mouth 3 (three) times daily with meals.   Vitamin D (Ergocalciferol) 1.25 MG (50000 UNIT) Caps capsule Commonly known as: DRISDOL Take 1 capsule (50,000 Units total) by mouth every 7 (seven) days.        Follow-up Information     Scheryl Marten, PA-C. Go in 2 week(s).   Specialty: Cardiology Contact information: [place] [address] [phone]                Discharge Exam: Danley Danker Weights   04/18/22 2331 04/20/22 0759 04/20/22 1200  Weight: 62.1 kg 52.4 kg 53.1 kg   GEN: NAD SKIN: Warm and dry.  Dressing on left forearm swelling/bruising is clean, dry and intact EYES: No pallor no icterus ENT: MMM CV: RRR PULM: CTA B ABD: soft, ND, NT, +BS CNS: AAO x 3, non focal EXT: No edema or tenderness   Condition at discharge: good  The results of significant diagnostics from this hospitalization (including imaging, microbiology, ancillary and laboratory) are listed below for reference.   Imaging Studies: US Venous Img Lower Bilateral (DVT)  Result Date: 04/20/2022 CLINICAL DATA:  History of smoking. History of previous DVT. Evaluate for acute or chronic DVT. EXAM: BILATERAL LOWER EXTREMITY VENOUS DOPPLER ULTRASOUND TECHNIQUE: Gray-scale sonography with graded compression, as well as color Doppler and duplex  ultrasound were performed to evaluate the lower extremity deep venous systems from the level of the common femoral vein and including the common femoral, femoral, profunda femoral, popliteal and calf veins including the posterior tibial, peroneal and gastrocnemius veins when visible. The superficial great saphenous vein was also interrogated. Spectral Doppler was utilized to evaluate flow at rest and with distal augmentation maneuvers in the common femoral, femoral and popliteal veins. COMPARISON:  Chest CT-04/19/2022 FINDINGS: RIGHT LOWER EXTREMITY Common Femoral Vein: No evidence of thrombus. Normal compressibility, respiratory phasicity and response to augmentation. Saphenofemoral Junction: No evidence of thrombus. Normal compressibility and flow on color Doppler imaging. Profunda Femoral Vein: No evidence of thrombus. Normal compressibility and flow on color Doppler imaging. Femoral Vein: No evidence of thrombus. Normal compressibility, respiratory phasicity and response to augmentation. Popliteal Vein: No evidence of thrombus. Normal compressibility, respiratory phasicity and response to augmentation. Calf Veins: No evidence of thrombus. Normal compressibility and flow on color Doppler imaging. Superficial Great Saphenous Vein:  No evidence of thrombus. Normal compressibility. Other Findings:  None. LEFT LOWER EXTREMITY Common Femoral Vein: No evidence of thrombus. Normal compressibility, respiratory phasicity and response to augmentation. Saphenofemoral Junction: No evidence of thrombus. Normal compressibility and flow on color Doppler imaging. Profunda Femoral Vein: No evidence of thrombus. Normal compressibility and flow on color Doppler imaging. Femoral Vein: No evidence of thrombus. Normal compressibility, respiratory phasicity and response to augmentation. Popliteal Vein: No evidence of thrombus. Normal compressibility, respiratory phasicity and response to augmentation. Calf Veins: No evidence of  thrombus. Normal compressibility and flow on color Doppler imaging. Superficial Great Saphenous Vein: No evidence of thrombus. Normal compressibility. Other Findings:  None. IMPRESSION: No evidence of acute or chronic DVT within either lower extremity. Electronically Signed   By: Sandi Mariscal M.D.   On: 04/20/2022 15:37   ECHOCARDIOGRAM COMPLETE  Result Date: 04/19/2022    ECHOCARDIOGRAM REPORT   Patient Name:   ADREAN MOLZAHN Date of Exam: 04/19/2022 Medical Rec #:  [id number]    Height:       63.0 in Accession #:    [id number]   Weight:       137.0 lb Date of Birth:  01-24-1956   BSA:          1.646 m Patient Age:    66 years     BP:           129/81 mmHg Patient Gender: M            HR:           71 bpm. Exam Location:  [facility] Procedure: 2D Echo, Cardiac Doppler and Color Doppler Indications:     NSTEMI  History:         Patient has prior history of Echocardiogram examinations, most                  recent 12/28/2019. CHF, Acute MI, Previous Myocardial Infarction                  and CAD, Prior CABG, Arrythmias:Atrial Fibrillation; Risk                  Factors:Hypertension, Current Smoker and Dyslipidemia.                  Pulmonary embolus, AAA.  Sonographer:     Wenda Low Referring Phys:  [id number] JAN A MANSY Diagnosing Phys: Kate Sable MD IMPRESSIONS  1. Left ventricular ejection fraction, by estimation, is 55 to 60%. The left ventricle has normal function. The left ventricle has no regional wall motion abnormalities. There is moderate concentric left ventricular hypertrophy. Left ventricular diastolic parameters are consistent with Grade I diastolic dysfunction (impaired relaxation).  2. Right ventricular systolic function is normal. The right ventricular size is normal. There is normal pulmonary artery systolic pressure.  3. The mitral valve is normal in structure. No evidence of mitral valve regurgitation.  4. The aortic valve is tricuspid. Aortic valve regurgitation is not visualized. Aortic  valve sclerosis/calcification is present, without any evidence of aortic stenosis.  5. The inferior vena cava is normal in size with greater than 50% respiratory variability, suggesting right atrial pressure of 3 mmHg. FINDINGS  Left Ventricle: Left ventricular ejection fraction, by estimation, is 55 to 60%. The left ventricle has normal function. The left ventricle has no regional wall motion abnormalities. The left ventricular internal cavity size was normal in size. There is  moderate concentric left ventricular hypertrophy. Left ventricular diastolic parameters are  consistent with Grade I diastolic dysfunction (impaired relaxation). Right Ventricle: The right ventricular size is normal. No increase in right ventricular wall thickness. Right ventricular systolic function is normal. There is normal pulmonary artery systolic pressure. The tricuspid regurgitant velocity is 2.18 m/s, and  with an assumed right atrial pressure of 3 mmHg, the estimated right ventricular systolic pressure is [id number] mmHg. Left Atrium: Left atrial size was normal in size. Right Atrium: Right atrial size was normal in size. Pericardium: There is no evidence of pericardial effusion. Mitral Valve: The mitral valve is normal in structure. No evidence of mitral valve regurgitation. MV peak gradient, 6.4 mmHg. The mean mitral valve gradient is 2.0 mmHg. Tricuspid Valve: The tricuspid valve is normal in structure. Tricuspid valve regurgitation is trivial. Aortic Valve: The aortic valve is tricuspid. Aortic valve regurgitation is not visualized. Aortic valve sclerosis/calcification is present, without any evidence of aortic stenosis. Aortic valve mean gradient measures 6.0 mmHg. Aortic valve peak gradient measures 11.0 mmHg. Aortic valve area, by VTI measures 2.03 cm. Pulmonic Valve: The pulmonic valve was normal in structure. Pulmonic valve regurgitation is not visualized. Aorta: The aortic root is normal in size and structure. Venous: The  inferior vena cava is normal in size with greater than 50% respiratory variability, suggesting right atrial pressure of 3 mmHg. IAS/Shunts: No atrial level shunt detected by color flow Doppler.  LEFT VENTRICLE PLAX 2D LVIDd:         3.80 cm   Diastology LVIDs:         2.90 cm   LV e' medial:    5.44 cm/s LV PW:         1.70 cm   LV E/e' medial:  9.3 LV IVS:        1.40 cm   LV e' lateral:   12.20 cm/s LVOT diam:     1.90 cm   LV E/e' lateral: 4.2 LV SV:         62 LV SV Index:   38 LVOT Area:     2.84 cm  RIGHT VENTRICLE RV Basal diam:  4.10 cm RV Mid diam:    3.60 cm RV S prime:     6.96 cm/s LEFT ATRIUM             Index        RIGHT ATRIUM           Index LA diam:        3.90 cm 2.37 cm/m   RA Area:     14.60 cm LA Vol (A2C):   62.0 ml 37.66 ml/m  RA Volume:   30.80 ml  18.71 ml/m LA Vol (A4C):   31.5 ml 19.13 ml/m LA Biplane Vol: 44.0 ml 26.72 ml/m  AORTIC VALVE                     PULMONIC VALVE AV Area (Vmax):    2.17 cm      PV Vmax:       1.12 m/s AV Area (Vmean):   2.10 cm      PV Peak grad:  5.0 mmHg AV Area (VTI):     2.03 cm AV Vmax:           166.00 cm/s AV Vmean:          119.000 cm/s AV VTI:            0.307 m AV Peak Grad:      11.0 mmHg AV  Mean Grad:      6.0 mmHg LVOT Vmax:         127.00 cm/s LVOT Vmean:        88.000 cm/s LVOT VTI:          0.220 m LVOT/AV VTI ratio: 0.72  AORTA Ao Root diam: 3.60 cm MITRAL VALVE               TRICUSPID VALVE MV Area (PHT): 2.70 cm    TR Peak grad:   19.0 mmHg MV Area VTI:   2.00 cm    TR Vmax:        218.00 cm/s MV Peak grad:  6.4 mmHg MV Mean grad:  2.0 mmHg    SHUNTS MV Vmax:       1.26 m/s    Systemic VTI:  0.22 m MV Vmean:      58.5 cm/s   Systemic Diam: 1.90 cm MV Decel Time: 281 msec MV E velocity: 50.80 cm/s MV A velocity: 68.10 cm/s MV E/A ratio:  0.75 Kate Sable MD Electronically signed by Kate Sable MD Signature Date/Time: 04/19/2022/4:02:54 PM    Final    CT ABDOMEN PELVIS W CONTRAST  Result Date: 04/19/2022 CLINICAL DATA:   Abdominal pain, acute, nonlocalized; Pulmonary embolism (PE) suspected, high prob EXAM: CT ANGIOGRAPHY CHEST CT ABDOMEN AND PELVIS WITH CONTRAST TECHNIQUE: Multidetector CT imaging of the chest was performed using the standard protocol during bolus administration of intravenous contrast. Multiplanar CT image reconstructions and MIPs were obtained to evaluate the vascular anatomy. Multidetector CT imaging of the abdomen and pelvis was performed using the standard protocol during bolus administration of intravenous contrast. RADIATION DOSE REDUCTION: This exam was performed according to the departmental dose-optimization program which includes automated exposure control, adjustment of the mA and/or kV according to patient size and/or use of iterative reconstruction technique. CONTRAST:  125m OMNIPAQUE IOHEXOL 350 MG/ML SOLN COMPARISON:  11/16/2021 FINDINGS: CTA CHEST FINDINGS Cardiovascular: There is adequate opacification of the pulmonary arterial tree. A small intraluminal filling defect is identified within the segmental pulmonary arteries of the left lower lobe in keeping with changes of acute pulmonary embolism. The embolic burden is small. The central pulmonary arteries are of normal caliber. No CT evidence of right heart strain. Coronary artery bypass grafting has been performed. Cardiac size is globally within normal limits though left ventricular hypertrophy is noted. No pericardial effusion. Mild atherosclerotic calcification noted within the thoracic aorta. No aortic aneurysm. Right internal jugular hemodialysis catheter tip noted within the superior right atrium. Mediastinum/Nodes: No enlarged mediastinal, hilar, or axillary lymph nodes. Thyroid gland, trachea, and esophagus demonstrate no significant findings. Lungs/Pleura: Moderate emphysema. No superimposed focal pulmonary infiltrate. No pneumothorax or pleural effusion. No central obstructing lesion. Musculoskeletal: Nonunion of the sternal body  noted. No acute bone abnormality. No lytic or blastic bone lesion. Review of the MIP images confirms the above findings. CT ABDOMEN and PELVIS FINDINGS Hepatobiliary: Multiple simple cysts and at least 3 benign cavernous hemangioma are seen within the liver. No suspicious enhancing intrahepatic masses identified. No intra or extrahepatic biliary ductal dilation. Gallbladder unremarkable. Pancreas: Unremarkable. No pancreatic ductal dilatation or surrounding inflammatory changes. Spleen: Normal in size without focal abnormality. Adrenals/Urinary Tract: The adrenal glands are unremarkable. The kidneys are markedly atrophic bilaterally but are otherwise unremarkable. The bladder is decompressed Stomach/Bowel: Stomach is within normal limits. Appendix appears normal. No evidence of bowel wall thickening, distention, or inflammatory changes. Vascular/Lymphatic: Extensive aortoiliac atherosclerotic calcification. No aortic aneurysm. No pathologic adenopathy  within the abdomen and pelvis. Reproductive: Prostate is unremarkable. Other: No abdominal wall hernia or abnormality. No abdominopelvic ascites. Musculoskeletal: No acute bone abnormality. No lytic or blastic bone lesion. Lumbar levoscoliosis noted. Degenerative changes noted at the lumbosacral junction. Review of the MIP images confirms the above findings. IMPRESSION: 1. Acute pulmonary embolism. The embolic burden is small. No CT evidence of right heart strain. 2. Left ventricular hypertrophy. 3. Moderate emphysema. 4. No acute intra-abdominal pathology identified. 5. Marked bilateral renal atrophy in keeping with chronic renal failure. 6. Extensive aortoiliac atherosclerotic calcification. Aortic Atherosclerosis (ICD10-I70.0) and Emphysema (ICD10-J43.9). Electronically Signed   By: Fidela Salisbury M.D.   On: 04/19/2022 00:57   CT Angio Chest PE W and/or Wo Contrast  Result Date: 04/19/2022 CLINICAL DATA:  Abdominal pain, acute, nonlocalized; Pulmonary embolism  (PE) suspected, high prob EXAM: CT ANGIOGRAPHY CHEST CT ABDOMEN AND PELVIS WITH CONTRAST TECHNIQUE: Multidetector CT imaging of the chest was performed using the standard protocol during bolus administration of intravenous contrast. Multiplanar CT image reconstructions and MIPs were obtained to evaluate the vascular anatomy. Multidetector CT imaging of the abdomen and pelvis was performed using the standard protocol during bolus administration of intravenous contrast. RADIATION DOSE REDUCTION: This exam was performed according to the departmental dose-optimization program which includes automated exposure control, adjustment of the mA and/or kV according to patient size and/or use of iterative reconstruction technique. CONTRAST:  181m OMNIPAQUE IOHEXOL 350 MG/ML SOLN COMPARISON:  11/16/2021 FINDINGS: CTA CHEST FINDINGS Cardiovascular: There is adequate opacification of the pulmonary arterial tree. A small intraluminal filling defect is identified within the segmental pulmonary arteries of the left lower lobe in keeping with changes of acute pulmonary embolism. The embolic burden is small. The central pulmonary arteries are of normal caliber. No CT evidence of right heart strain. Coronary artery bypass grafting has been performed. Cardiac size is globally within normal limits though left ventricular hypertrophy is noted. No pericardial effusion. Mild atherosclerotic calcification noted within the thoracic aorta. No aortic aneurysm. Right internal jugular hemodialysis catheter tip noted within the superior right atrium. Mediastinum/Nodes: No enlarged mediastinal, hilar, or axillary lymph nodes. Thyroid gland, trachea, and esophagus demonstrate no significant findings. Lungs/Pleura: Moderate emphysema. No superimposed focal pulmonary infiltrate. No pneumothorax or pleural effusion. No central obstructing lesion. Musculoskeletal: Nonunion of the sternal body noted. No acute bone abnormality. No lytic or blastic bone  lesion. Review of the MIP images confirms the above findings. CT ABDOMEN and PELVIS FINDINGS Hepatobiliary: Multiple simple cysts and at least 3 benign cavernous hemangioma are seen within the liver. No suspicious enhancing intrahepatic masses identified. No intra or extrahepatic biliary ductal dilation. Gallbladder unremarkable. Pancreas: Unremarkable. No pancreatic ductal dilatation or surrounding inflammatory changes. Spleen: Normal in size without focal abnormality. Adrenals/Urinary Tract: The adrenal glands are unremarkable. The kidneys are markedly atrophic bilaterally but are otherwise unremarkable. The bladder is decompressed Stomach/Bowel: Stomach is within normal limits. Appendix appears normal. No evidence of bowel wall thickening, distention, or inflammatory changes. Vascular/Lymphatic: Extensive aortoiliac atherosclerotic calcification. No aortic aneurysm. No pathologic adenopathy within the abdomen and pelvis. Reproductive: Prostate is unremarkable. Other: No abdominal wall hernia or abnormality. No abdominopelvic ascites. Musculoskeletal: No acute bone abnormality. No lytic or blastic bone lesion. Lumbar levoscoliosis noted. Degenerative changes noted at the lumbosacral junction. Review of the MIP images confirms the above findings. IMPRESSION: 1. Acute pulmonary embolism. The embolic burden is small. No CT evidence of right heart strain. 2. Left ventricular hypertrophy. 3. Moderate emphysema. 4. No acute intra-abdominal pathology identified.  5. Marked bilateral renal atrophy in keeping with chronic renal failure. 6. Extensive aortoiliac atherosclerotic calcification. Aortic Atherosclerosis (ICD10-I70.0) and Emphysema (ICD10-J43.9). Electronically Signed   By: Fidela Salisbury M.D.   On: 04/19/2022 00:57   DG Chest Portable 1 View  Result Date: 04/19/2022 CLINICAL DATA:  Three weeks history of shortness of breath. EXAM: PORTABLE CHEST 1 VIEW COMPARISON:  CTA chest 11/16/21. FINDINGS: Again noted  borderline cardiomegaly with old CABG. No vascular congestion is seen. Right IJ dialysis catheter again noted with tip about the superior cavoatrial junction. Stable mediastinum with aortic tortuosity and calcific plaque. The lungs are emphysematous but clear. No pleural effusion. S shaped thoracolumbar scoliosis. IMPRESSION: 1. No evidence of acute chest process. Emphysema. 2. Aortic atherosclerosis and tortuosity. Electronically Signed   By: Telford Nab M.D.   On: 04/19/2022 00:07    Microbiology: Results for orders placed or performed during the hospital encounter of 04/18/22  MRSA Next Gen by PCR, Nasal     Status: Abnormal   Collection Time: 04/19/22  1:25 PM   Specimen: Nasal Mucosa; Nasal Swab  Result Value Ref Range Status   MRSA by PCR Next Gen DETECTED (A) NOT DETECTED Final    Comment: RESULT CALLED TO, READ BACK BY AND VERIFIED WITH: JOY BEEMAN2/26/24 MW (NOTE) The GeneXpert MRSA Assay (FDA approved for NASAL specimens only), is one component of a comprehensive MRSA colonization surveillance program. It is not intended to diagnose MRSA infection nor to guide or monitor treatment for MRSA infections. Test performance is not FDA approved in patients less than 65 years old. Performed at [facility], [address] [place], [place] [address]     Labs: CBC: Recent Labs  Lab 04/18/22 2337 04/19/22 0409 04/20/22 0807 04/21/22 0255  WBC 11.1* 13.9* 8.8 9.4  NEUTROABS 7.4  --   --   --   HGB 12.2* 11.7* 11.6* 12.1*  HCT 40.2 37.4* 38.3* 39.4  MCV 93.5 92.6 93.4 93.1  PLT 268 275 267 [id number]   Basic Metabolic Panel: Recent Labs  Lab 04/18/22 2337 04/19/22 0409 04/19/22 2139 04/20/22 0807 04/21/22 0255  NA 141 135  --  138 139  K 5.4* 5.9* 3.9 4.7 4.2  CL 100 100  --  98 103  CO2 22 18*  --  25 24  GLUCOSE 97 94  --  85 103*  BUN 62* 62*  --  46* 44*  CREATININE 8.20* 8.30*  --  6.37* 5.00*  CALCIUM 7.1* 6.6*  --  6.9* 7.3*   Liver Function  Tests: Recent Labs  Lab 04/18/22 2337 04/20/22 0806  AST 18  --   ALT 18  --   ALKPHOS 59  --   BILITOT 0.6  --   PROT 7.2  --   ALBUMIN 3.8 3.3*   CBG: No results for input(s): "GLUCAP" in the last 168 hours.  Discharge time spent: greater than 30 minutes.  Signed: Jennye Boroughs, MD [facility] 04/21/2022

## 2022-04-21 NOTE — Progress Notes (Signed)
Mobility Specialist - Progress Note   04/21/22 0941  Mobility  Activity Stood at bedside;Ambulated independently in hallway;Dangled on edge of bed  Level of Assistance Independent  Assistive Device None  Distance Ambulated (ft) 140 ft  Activity Response Tolerated well  Mobility Referral Yes  $Mobility charge 1 Mobility   Pt supine in bed on RA upon arrival. Pt STS and ambulates in hallway indep with no LOB noted. Pt returns to recliner with needs in reach.   Gretchen Short  Mobility Specialist  04/21/22 9:42 AM

## 2022-04-21 NOTE — ED Notes (Signed)
Pt hand still bleeding after bandage removed.

## 2022-04-21 NOTE — Progress Notes (Signed)
Pharmacist provided education on apixaban.  First dose given, patient verbalized understanding of the importance of adherence to new med.  Pt stated he is ready to go.  Dr. Mal Misty made aware.

## 2022-04-22 LAB — LIPOPROTEIN A (LPA): Lipoprotein (a): 46.2 nmol/L — ABNORMAL HIGH (ref <75.0)

## 2022-04-26 ENCOUNTER — Emergency Department: Payer: Medicare Other

## 2022-04-26 ENCOUNTER — Inpatient Hospital Stay: Payer: Medicare Other | Admitting: Anesthesiology

## 2022-04-26 ENCOUNTER — Encounter
Admission: EM | Disposition: A | Discharge status: Home / Self Care | Payer: Self-pay | Source: Home / Self Care | Attending: Student

## 2022-04-26 ENCOUNTER — Other Ambulatory Visit: Payer: Self-pay

## 2022-04-26 ENCOUNTER — Inpatient Hospital Stay
Admission: EM | Admit: 2022-04-26 | Discharge: 2022-05-01 | DRG: 326 | Disposition: A | Discharge status: Home / Self Care | Payer: Medicare Other | Attending: Internal Medicine | Admitting: Internal Medicine

## 2022-04-26 DIAGNOSIS — I420 Dilated cardiomyopathy: Secondary | ICD-10-CM | POA: Diagnosis present

## 2022-04-26 DIAGNOSIS — E872 Acidosis, unspecified: Secondary | ICD-10-CM | POA: Diagnosis present

## 2022-04-26 DIAGNOSIS — Z992 Dependence on renal dialysis: Secondary | ICD-10-CM

## 2022-04-26 DIAGNOSIS — Z8551 Personal history of malignant neoplasm of bladder: Secondary | ICD-10-CM | POA: Diagnosis not present

## 2022-04-26 DIAGNOSIS — K265 Chronic or unspecified duodenal ulcer with perforation: Principal | ICD-10-CM | POA: Diagnosis present

## 2022-04-26 DIAGNOSIS — Z8616 Personal history of COVID-19: Secondary | ICD-10-CM | POA: Diagnosis not present

## 2022-04-26 DIAGNOSIS — I252 Old myocardial infarction: Secondary | ICD-10-CM | POA: Diagnosis not present

## 2022-04-26 DIAGNOSIS — N186 End stage renal disease: Secondary | ICD-10-CM | POA: Diagnosis present

## 2022-04-26 DIAGNOSIS — Z7982 Long term (current) use of aspirin: Secondary | ICD-10-CM

## 2022-04-26 DIAGNOSIS — I480 Paroxysmal atrial fibrillation: Secondary | ICD-10-CM | POA: Diagnosis present

## 2022-04-26 DIAGNOSIS — Z7901 Long term (current) use of anticoagulants: Secondary | ICD-10-CM

## 2022-04-26 DIAGNOSIS — K631 Perforation of intestine (nontraumatic): Secondary | ICD-10-CM

## 2022-04-26 DIAGNOSIS — I739 Peripheral vascular disease, unspecified: Secondary | ICD-10-CM | POA: Diagnosis present

## 2022-04-26 DIAGNOSIS — Z79899 Other long term (current) drug therapy: Secondary | ICD-10-CM

## 2022-04-26 DIAGNOSIS — E785 Hyperlipidemia, unspecified: Secondary | ICD-10-CM | POA: Diagnosis present

## 2022-04-26 DIAGNOSIS — N2581 Secondary hyperparathyroidism of renal origin: Secondary | ICD-10-CM | POA: Diagnosis present

## 2022-04-26 DIAGNOSIS — I132 Hypertensive heart and chronic kidney disease with heart failure and with stage 5 chronic kidney disease, or end stage renal disease: Secondary | ICD-10-CM | POA: Diagnosis present

## 2022-04-26 DIAGNOSIS — I2510 Atherosclerotic heart disease of native coronary artery without angina pectoris: Secondary | ICD-10-CM | POA: Diagnosis present

## 2022-04-26 DIAGNOSIS — E875 Hyperkalemia: Secondary | ICD-10-CM | POA: Diagnosis present

## 2022-04-26 DIAGNOSIS — R109 Unspecified abdominal pain: Principal | ICD-10-CM

## 2022-04-26 DIAGNOSIS — Z951 Presence of aortocoronary bypass graft: Secondary | ICD-10-CM | POA: Diagnosis not present

## 2022-04-26 DIAGNOSIS — Z1152 Encounter for screening for COVID-19: Secondary | ICD-10-CM

## 2022-04-26 DIAGNOSIS — I5022 Chronic systolic (congestive) heart failure: Secondary | ICD-10-CM | POA: Diagnosis present

## 2022-04-26 DIAGNOSIS — D631 Anemia in chronic kidney disease: Secondary | ICD-10-CM | POA: Diagnosis present

## 2022-04-26 DIAGNOSIS — Z86711 Personal history of pulmonary embolism: Secondary | ICD-10-CM

## 2022-04-26 DIAGNOSIS — K668 Other specified disorders of peritoneum: Secondary | ICD-10-CM

## 2022-04-26 HISTORY — PX: LAPAROTOMY: SHX154

## 2022-04-26 HISTORY — DX: Perforation of intestine (nontraumatic): K63.1

## 2022-04-26 LAB — CBC
HCT: 40.9 % (ref 39.0–52.0)
Hemoglobin: 12.4 g/dL — ABNORMAL LOW (ref 13.0–17.0)
MCH: 29 pg (ref 26.0–34.0)
MCHC: 30.3 g/dL (ref 30.0–36.0)
MCV: 95.6 fL (ref 80.0–100.0)
Platelets: 323 10*3/uL (ref 150–400)
RBC: 4.28 MIL/uL (ref 4.22–5.81)
RDW: 17.8 % — ABNORMAL HIGH (ref 11.5–15.5)
WBC: 17.2 10*3/uL — ABNORMAL HIGH (ref 4.0–10.5)
nRBC: 0 % (ref 0.0–0.2)

## 2022-04-26 LAB — COMPREHENSIVE METABOLIC PANEL
ALT: 15 U/L (ref 0–44)
AST: 15 U/L (ref 15–41)
Albumin: 3.8 g/dL (ref 3.5–5.0)
Alkaline Phosphatase: 52 U/L (ref 38–126)
Anion gap: 19 — ABNORMAL HIGH (ref 5–15)
BUN: 77 mg/dL — ABNORMAL HIGH (ref 8–23)
CO2: 17 mmol/L — ABNORMAL LOW (ref 22–32)
Calcium: 7.4 mg/dL — ABNORMAL LOW (ref 8.9–10.3)
Chloride: 102 mmol/L (ref 98–111)
Creatinine, Ser: 9.9 mg/dL — ABNORMAL HIGH (ref 0.61–1.24)
GFR, Estimated: 5 mL/min — ABNORMAL LOW (ref >60)
Glucose, Bld: 98 mg/dL (ref 70–99)
Potassium: 5.9 mmol/L — ABNORMAL HIGH (ref 3.5–5.1)
Sodium: 138 mmol/L (ref 135–145)
Total Bilirubin: 0.5 mg/dL (ref 0.3–1.2)
Total Protein: 7.2 g/dL (ref 6.5–8.1)

## 2022-04-26 LAB — RESP PANEL BY RT-PCR (RSV, FLU A&B, COVID)  RVPGX2
Influenza A by PCR: NEGATIVE
Influenza B by PCR: NEGATIVE
Resp Syncytial Virus by PCR: NEGATIVE
SARS Coronavirus 2 by RT PCR: NEGATIVE

## 2022-04-26 LAB — POTASSIUM: Potassium: 5 mmol/L (ref 3.5–5.1)

## 2022-04-26 LAB — LIPASE, BLOOD: Lipase: 38 U/L (ref 11–51)

## 2022-04-26 SURGERY — LAPAROTOMY, EXPLORATORY
Anesthesia: General

## 2022-04-26 MED ORDER — SUGAMMADEX SODIUM 200 MG/2ML IV SOLN
INTRAVENOUS | Status: DC | PRN
  Administered 2022-04-26: 200 mg via INTRAVENOUS

## 2022-04-26 MED ORDER — ALBUMIN HUMAN 5 % IV SOLN
INTRAVENOUS | Status: AC
  Filled 2022-04-26: qty 250

## 2022-04-26 MED ORDER — ATORVASTATIN CALCIUM 80 MG PO TABS
80.0000 mg | ORAL_TABLET | Freq: Every day | ORAL | Status: DC
  Administered 2022-04-28 – 2022-04-30 (×3): 80 mg via ORAL
  Filled 2022-04-26 (×4): qty 1

## 2022-04-26 MED ORDER — 0.9 % SODIUM CHLORIDE (POUR BTL) OPTIME
TOPICAL | Status: DC | PRN
  Administered 2022-04-26: 2000 mL

## 2022-04-26 MED ORDER — BUPIVACAINE HCL (PF) 0.5 % IJ SOLN
INTRAMUSCULAR | Status: AC
  Filled 2022-04-26: qty 30

## 2022-04-26 MED ORDER — ANTICOAGULANT SODIUM CITRATE 4% (200MG/5ML) IV SOLN
5.0000 mL | Status: DC | PRN
  Filled 2022-04-26: qty 5

## 2022-04-26 MED ORDER — ALBUTEROL SULFATE HFA 108 (90 BASE) MCG/ACT IN AERS
INHALATION_SPRAY | RESPIRATORY_TRACT | Status: DC | PRN
  Administered 2022-04-26: 4 via RESPIRATORY_TRACT

## 2022-04-26 MED ORDER — PENTAFLUOROPROP-TETRAFLUOROETH EX AERO: 1.0000 | INHALATION_SPRAY | CUTANEOUS | Status: DC | PRN

## 2022-04-26 MED ORDER — OXYCODONE HCL 5 MG PO TABS: 5.0000 mg | ORAL_TABLET | Freq: Once | ORAL | Status: DC | PRN

## 2022-04-26 MED ORDER — FENTANYL CITRATE (PF) 100 MCG/2ML IJ SOLN
INTRAMUSCULAR | Status: DC | PRN
  Administered 2022-04-26 (×4): 50 ug via INTRAVENOUS

## 2022-04-26 MED ORDER — HYDRALAZINE HCL 20 MG/ML IJ SOLN
10.0000 mg | Freq: Four times a day (QID) | INTRAMUSCULAR | Status: DC | PRN
  Administered 2022-04-29: 10 mg via INTRAVENOUS
  Filled 2022-04-26: qty 1

## 2022-04-26 MED ORDER — CHLORHEXIDINE GLUCONATE CLOTH 2 % EX PADS
6.0000 | MEDICATED_PAD | Freq: Every day | CUTANEOUS | Status: DC
  Administered 2022-04-27 – 2022-05-01 (×5): 6 via TOPICAL

## 2022-04-26 MED ORDER — LABETALOL HCL 5 MG/ML IV SOLN: 5.0000 mg | INTRAVENOUS | Status: DC | PRN

## 2022-04-26 MED ORDER — PIPERACILLIN-TAZOBACTAM IN DEX 2-0.25 GM/50ML IV SOLN
2.2500 g | Freq: Three times a day (TID) | INTRAVENOUS | Status: DC
  Administered 2022-04-27 – 2022-04-30 (×10): 2.25 g via INTRAVENOUS
  Filled 2022-04-26 (×11): qty 50

## 2022-04-26 MED ORDER — CEFAZOLIN SODIUM-DEXTROSE 2-3 GM-%(50ML) IV SOLR
INTRAVENOUS | Status: DC | PRN
  Administered 2022-04-26: 2 g via INTRAVENOUS

## 2022-04-26 MED ORDER — SODIUM CHLORIDE 0.9% FLUSH: 3.0000 mL | INTRAVENOUS | Status: DC | PRN

## 2022-04-26 MED ORDER — SODIUM CHLORIDE 0.9% FLUSH
3.0000 mL | Freq: Two times a day (BID) | INTRAVENOUS | Status: DC
  Administered 2022-04-27 – 2022-04-30 (×6): 3 mL via INTRAVENOUS

## 2022-04-26 MED ORDER — EPHEDRINE 5 MG/ML INJ
INTRAVENOUS | Status: AC
  Filled 2022-04-26: qty 5

## 2022-04-26 MED ORDER — ACETAMINOPHEN 325 MG PO TABS: 650.0000 mg | ORAL_TABLET | Freq: Four times a day (QID) | ORAL | Status: DC | PRN

## 2022-04-26 MED ORDER — ACETAMINOPHEN 10 MG/ML IV SOLN
INTRAVENOUS | Status: AC
  Filled 2022-04-26: qty 100

## 2022-04-26 MED ORDER — PROTHROMBIN COMPLEX CONC HUMAN 500 UNITS IV KIT
2610.0000 [IU] | PACK | Status: AC
  Administered 2022-04-26: 2610 [IU] via INTRAVENOUS
  Filled 2022-04-26: qty 2610

## 2022-04-26 MED ORDER — HYDROMORPHONE HCL 1 MG/ML IJ SOLN
0.5000 mg | INTRAMUSCULAR | Status: DC | PRN
  Administered 2022-04-26 – 2022-04-29 (×11): 1 mg via INTRAVENOUS
  Filled 2022-04-26 (×11): qty 1

## 2022-04-26 MED ORDER — BUPIVACAINE LIPOSOME 1.3 % IJ SUSP
INTRAMUSCULAR | Status: AC
  Filled 2022-04-26: qty 20

## 2022-04-26 MED ORDER — BUPIVACAINE LIPOSOME 1.3 % IJ SUSP
INTRAMUSCULAR | Status: DC | PRN
  Administered 2022-04-26: 20 mL

## 2022-04-26 MED ORDER — ONDANSETRON HCL 4 MG/2ML IJ SOLN: 4.0000 mg | Freq: Four times a day (QID) | INTRAMUSCULAR | Status: DC | PRN

## 2022-04-26 MED ORDER — LIDOCAINE HCL (PF) 1 % IJ SOLN
5.0000 mL | INTRAMUSCULAR | Status: DC | PRN
  Filled 2022-04-26: qty 5

## 2022-04-26 MED ORDER — CALCITRIOL 0.25 MCG PO CAPS
0.5000 ug | ORAL_CAPSULE | Freq: Every day | ORAL | Status: DC
  Administered 2022-04-28 – 2022-05-01 (×4): 0.5 ug via ORAL
  Filled 2022-04-26 (×5): qty 2

## 2022-04-26 MED ORDER — MORPHINE SULFATE (PF) 4 MG/ML IV SOLN
4.0000 mg | Freq: Once | INTRAVENOUS | Status: AC
  Administered 2022-04-26: 4 mg via INTRAVENOUS
  Filled 2022-04-26: qty 1

## 2022-04-26 MED ORDER — MIDAZOLAM HCL 2 MG/2ML IJ SOLN
INTRAMUSCULAR | Status: DC | PRN
  Administered 2022-04-26: 1 mg via INTRAVENOUS

## 2022-04-26 MED ORDER — MIDAZOLAM HCL 2 MG/2ML IJ SOLN
INTRAMUSCULAR | Status: AC
  Filled 2022-04-26: qty 2

## 2022-04-26 MED ORDER — VITAMIN K1 10 MG/ML IJ SOLN: 10.0000 mg | INTRAVENOUS | Status: DC

## 2022-04-26 MED ORDER — INSULIN ASPART 100 UNIT/ML IV SOLN
10.0000 [IU] | Freq: Once | INTRAVENOUS | Status: AC
  Administered 2022-04-26: 10 [IU] via INTRAVENOUS
  Filled 2022-04-26: qty 0.1

## 2022-04-26 MED ORDER — ONDANSETRON HCL 4 MG/2ML IJ SOLN
INTRAMUSCULAR | Status: AC
  Filled 2022-04-26: qty 2

## 2022-04-26 MED ORDER — ALBUTEROL SULFATE HFA 108 (90 BASE) MCG/ACT IN AERS
INHALATION_SPRAY | RESPIRATORY_TRACT | Status: AC
  Filled 2022-04-26: qty 6.7

## 2022-04-26 MED ORDER — LACTATED RINGERS IV SOLN: INTRAVENOUS | Status: DC | PRN

## 2022-04-26 MED ORDER — FENTANYL CITRATE (PF) 100 MCG/2ML IJ SOLN
INTRAMUSCULAR | Status: AC
  Filled 2022-04-26: qty 2

## 2022-04-26 MED ORDER — PHENYLEPHRINE 80 MCG/ML (10ML) SYRINGE FOR IV PUSH (FOR BLOOD PRESSURE SUPPORT)
PREFILLED_SYRINGE | INTRAVENOUS | Status: AC
  Filled 2022-04-26: qty 10

## 2022-04-26 MED ORDER — PROPOFOL 10 MG/ML IV BOLUS
INTRAVENOUS | Status: AC
  Filled 2022-04-26: qty 40

## 2022-04-26 MED ORDER — HEPARIN SODIUM (PORCINE) 1000 UNIT/ML DIALYSIS
1000.0000 [IU] | INTRAMUSCULAR | Status: DC | PRN
  Administered 2022-04-27: 1000 [IU]
  Filled 2022-04-26: qty 1

## 2022-04-26 MED ORDER — SODIUM CHLORIDE 0.9 % IV SOLN: INTRAVENOUS | Status: DC | PRN

## 2022-04-26 MED ORDER — CALCIUM GLUCONATE-NACL 1-0.675 GM/50ML-% IV SOLN
1.0000 g | Freq: Once | INTRAVENOUS | Status: AC
  Administered 2022-04-26: 1000 mg via INTRAVENOUS
  Filled 2022-04-26: qty 50

## 2022-04-26 MED ORDER — ROCURONIUM BROMIDE 10 MG/ML (PF) SYRINGE
PREFILLED_SYRINGE | INTRAVENOUS | Status: AC
  Filled 2022-04-26: qty 10

## 2022-04-26 MED ORDER — SODIUM CHLORIDE (PF) 0.9 % IJ SOLN
INTRAMUSCULAR | Status: AC
  Filled 2022-04-26: qty 50

## 2022-04-26 MED ORDER — LIDOCAINE-PRILOCAINE 2.5-2.5 % EX CREA: 1.0000 | TOPICAL_CREAM | CUTANEOUS | Status: DC | PRN

## 2022-04-26 MED ORDER — ONDANSETRON HCL 4 MG PO TABS: 4.0000 mg | ORAL_TABLET | Freq: Four times a day (QID) | ORAL | Status: DC | PRN

## 2022-04-26 MED ORDER — STERILE WATER FOR INJECTION IV SOLN
INTRAVENOUS | Status: DC
  Filled 2022-04-26 (×2): qty 1000
  Filled 2022-04-26: qty 150
  Filled 2022-04-26: qty 1000
  Filled 2022-04-26: qty 150

## 2022-04-26 MED ORDER — HYDRALAZINE HCL 25 MG PO TABS
25.0000 mg | ORAL_TABLET | Freq: Three times a day (TID) | ORAL | Status: DC
  Administered 2022-04-28 – 2022-04-30 (×7): 25 mg via ORAL
  Filled 2022-04-26 (×9): qty 1

## 2022-04-26 MED ORDER — FENTANYL CITRATE (PF) 100 MCG/2ML IJ SOLN: 25.0000 ug | INTRAMUSCULAR | Status: DC | PRN

## 2022-04-26 MED ORDER — PROPOFOL 10 MG/ML IV BOLUS
INTRAVENOUS | Status: DC | PRN
  Administered 2022-04-26 (×2): 50 mg via INTRAVENOUS
  Administered 2022-04-26: 80 mg via INTRAVENOUS

## 2022-04-26 MED ORDER — EPHEDRINE SULFATE (PRESSORS) 50 MG/ML IJ SOLN
INTRAMUSCULAR | Status: DC | PRN
  Administered 2022-04-26: 25 mg via INTRAVENOUS

## 2022-04-26 MED ORDER — CALCIUM CHLORIDE 10 % IV SOLN
INTRAVENOUS | Status: AC
  Filled 2022-04-26: qty 10

## 2022-04-26 MED ORDER — OXYCODONE HCL 5 MG/5ML PO SOLN: 5.0000 mg | Freq: Once | ORAL | Status: DC | PRN

## 2022-04-26 MED ORDER — KETAMINE HCL 50 MG/5ML IJ SOSY
PREFILLED_SYRINGE | INTRAMUSCULAR | Status: AC
  Filled 2022-04-26: qty 5

## 2022-04-26 MED ORDER — DEXTROSE 50 % IV SOLN
1.0000 | Freq: Once | INTRAVENOUS | Status: AC
  Administered 2022-04-26: 50 mL via INTRAVENOUS
  Filled 2022-04-26: qty 50

## 2022-04-26 MED ORDER — EPINEPHRINE PF 1 MG/ML IJ SOLN
INTRAMUSCULAR | Status: AC
  Filled 2022-04-26: qty 1

## 2022-04-26 MED ORDER — ONDANSETRON HCL 4 MG/2ML IJ SOLN: 4.0000 mg | Freq: Once | INTRAMUSCULAR | Status: DC | PRN

## 2022-04-26 MED ORDER — ISOSORBIDE DINITRATE 10 MG PO TABS
10.0000 mg | ORAL_TABLET | Freq: Three times a day (TID) | ORAL | Status: DC
  Administered 2022-04-28 – 2022-04-30 (×6): 10 mg via ORAL
  Filled 2022-04-26 (×12): qty 1

## 2022-04-26 MED ORDER — NOREPINEPHRINE 4 MG/250ML-% IV SOLN
INTRAVENOUS | Status: DC | PRN
  Administered 2022-04-26: 4 ug/min via INTRAVENOUS

## 2022-04-26 MED ORDER — SODIUM CHLORIDE 0.9 % IV SOLN
250.0000 mL | INTRAVENOUS | Status: DC | PRN
  Administered 2022-04-28: 250 mL via INTRAVENOUS

## 2022-04-26 MED ORDER — SODIUM CHLORIDE 0.9% FLUSH
3.0000 mL | Freq: Two times a day (BID) | INTRAVENOUS | Status: DC
  Administered 2022-04-27 – 2022-05-01 (×7): 3 mL via INTRAVENOUS

## 2022-04-26 MED ORDER — KETAMINE HCL 10 MG/ML IJ SOLN
INTRAMUSCULAR | Status: DC | PRN
  Administered 2022-04-26 (×2): 25 mg via INTRAVENOUS

## 2022-04-26 MED ORDER — ALTEPLASE 2 MG IJ SOLR: 2.0000 mg | Freq: Once | INTRAMUSCULAR | Status: DC | PRN

## 2022-04-26 MED ORDER — SUCCINYLCHOLINE CHLORIDE 200 MG/10ML IV SOSY
PREFILLED_SYRINGE | INTRAVENOUS | Status: AC
  Filled 2022-04-26: qty 10

## 2022-04-26 MED ORDER — PIPERACILLIN-TAZOBACTAM 3.375 G IVPB 30 MIN
3.3750 g | Freq: Once | INTRAVENOUS | Status: AC
  Administered 2022-04-26: 3.375 g via INTRAVENOUS
  Filled 2022-04-26: qty 50

## 2022-04-26 MED ORDER — LIDOCAINE HCL (PF) 2 % IJ SOLN
INTRAMUSCULAR | Status: AC
  Filled 2022-04-26: qty 5

## 2022-04-26 MED ORDER — DEXAMETHASONE SODIUM PHOSPHATE 10 MG/ML IJ SOLN
INTRAMUSCULAR | Status: DC | PRN
  Administered 2022-04-26: 10 mg via INTRAVENOUS

## 2022-04-26 MED ORDER — CALCIUM CHLORIDE 10 % IV SOLN
INTRAVENOUS | Status: DC | PRN
  Administered 2022-04-26 (×2): .5 g via INTRAVENOUS

## 2022-04-26 MED ORDER — LIDOCAINE HCL (CARDIAC) PF 100 MG/5ML IV SOSY
PREFILLED_SYRINGE | INTRAVENOUS | Status: DC | PRN
  Administered 2022-04-26: 60 mg via INTRAVENOUS

## 2022-04-26 MED ORDER — PHENYLEPHRINE 80 MCG/ML (10ML) SYRINGE FOR IV PUSH (FOR BLOOD PRESSURE SUPPORT)
PREFILLED_SYRINGE | INTRAVENOUS | Status: DC | PRN
  Administered 2022-04-26 (×2): 200 ug via INTRAVENOUS

## 2022-04-26 MED ORDER — ROCURONIUM BROMIDE 100 MG/10ML IV SOLN
INTRAVENOUS | Status: DC | PRN
  Administered 2022-04-26: 70 mg via INTRAVENOUS
  Administered 2022-04-26: 10 mg via INTRAVENOUS

## 2022-04-26 MED ORDER — BUPIVACAINE-EPINEPHRINE (PF) 0.5% -1:200000 IJ SOLN
INTRAMUSCULAR | Status: DC | PRN
  Administered 2022-04-26: 10 mL

## 2022-04-26 MED ORDER — ACETAMINOPHEN 650 MG RE SUPP: 650.0000 mg | Freq: Four times a day (QID) | RECTAL | Status: DC | PRN

## 2022-04-26 MED ORDER — FENTANYL CITRATE PF 50 MCG/ML IJ SOSY
50.0000 ug | PREFILLED_SYRINGE | Freq: Once | INTRAMUSCULAR | Status: AC
  Administered 2022-04-26: 50 ug via INTRAVENOUS
  Filled 2022-04-26: qty 1

## 2022-04-26 MED ORDER — ONDANSETRON HCL 4 MG/2ML IJ SOLN
INTRAMUSCULAR | Status: DC | PRN
  Administered 2022-04-26: 4 mg via INTRAVENOUS

## 2022-04-26 SURGICAL SUPPLY — 52 items
APL PRP STRL LF DISP 70% ISPRP (MISCELLANEOUS) ×1
BULB RESERV EVAC DRAIN JP 100C (MISCELLANEOUS) IMPLANT
CHLORAPREP W/TINT 26 (MISCELLANEOUS) ×1 IMPLANT
DRAIN CHANNEL JP 15F RND 16 (MISCELLANEOUS) IMPLANT
DRAPE LAPAROTOMY 100X77 ABD (DRAPES) ×1 IMPLANT
DRSG OPSITE POSTOP 4X10 (GAUZE/BANDAGES/DRESSINGS) IMPLANT
DRSG OPSITE POSTOP 4X8 (GAUZE/BANDAGES/DRESSINGS) IMPLANT
ELECT BLADE 6.5 EXT (BLADE) ×1 IMPLANT
ELECT CAUTERY BLADE 6.4 (BLADE) ×1 IMPLANT
ELECT REM PT RETURN 9FT ADLT (ELECTROSURGICAL) ×1 IMPLANT
ELECTRODE REM PT RTRN 9FT ADLT (ELECTROSURGICAL) ×1 IMPLANT
GAUZE 4X4 16PLY ~~LOC~~+RFID DBL (SPONGE) ×1 IMPLANT
GLOVE BIO SURGEON STRL SZ 6.5 (GLOVE) ×1 IMPLANT
GLOVE BIOGEL PI IND STRL 6.5 (GLOVE) ×1 IMPLANT
GOWN STRL REUS W/ TWL LRG LVL3 (GOWN DISPOSABLE) ×2 IMPLANT
GOWN STRL REUS W/TWL LRG LVL3 (GOWN DISPOSABLE) ×2
KIT TURNOVER KIT A (KITS) ×1 IMPLANT
LABEL OR SOLS (LABEL) ×1 IMPLANT
LIGASURE IMPACT 36 18CM CVD LR (INSTRUMENTS) IMPLANT
MANIFOLD NEPTUNE II (INSTRUMENTS) ×1 IMPLANT
NEEDLE HYPO 22GX1.5 SAFETY (NEEDLE) ×1 IMPLANT
NS IRRIG 1000ML POUR BTL (IV SOLUTION) ×1 IMPLANT
NS IRRIG 500ML POUR BTL (IV SOLUTION) IMPLANT
PACK BASIN MAJOR ARMC (MISCELLANEOUS) ×1 IMPLANT
PACK COLON CLEAN CLOSURE (MISCELLANEOUS) ×1 IMPLANT
RELOAD LINEAR CUT PROX 55 BLUE (ENDOMECHANICALS) IMPLANT
RELOAD PROXIMATE 75MM BLUE (ENDOMECHANICALS) IMPLANT
RELOAD STAPLE 55 3.8 BLU REG (ENDOMECHANICALS) IMPLANT
RELOAD STAPLE 75 3.8 BLU REG (ENDOMECHANICALS) IMPLANT
SLEEVE SCD COMPRESS KNEE MED (STOCKING) IMPLANT
SPIKE FLUID TRANSFER (MISCELLANEOUS) IMPLANT
SPONGE DRAIN TRACH 4X4 STRL 2S (GAUZE/BANDAGES/DRESSINGS) IMPLANT
SPONGE T-LAP 18X18 ~~LOC~~+RFID (SPONGE) ×4 IMPLANT
STAPLER GUN LINEAR PROX 60 (STAPLE) IMPLANT
STAPLER PROXIMATE 55 BLUE (STAPLE) IMPLANT
STAPLER PROXIMATE 75MM BLUE (STAPLE) IMPLANT
STAPLER SKIN PROX 35W (STAPLE) ×1 IMPLANT
SUT ETHILON 3-0 FS-10 30 BLK (SUTURE) ×1 IMPLANT
SUT PDS AB 0 CT1 27 (SUTURE) IMPLANT
SUT SILK 2 0 (SUTURE)
SUT SILK 2 0SH CR/8 30 (SUTURE) IMPLANT
SUT SILK 2-0 18XBRD TIE 12 (SUTURE) ×1 IMPLANT
SUT SILK 3-0 (SUTURE) IMPLANT
SUT STRATAFIX PDS 30 CT-1 (SUTURE) IMPLANT
SUT VIC AB 3-0 SH 27 (SUTURE) ×1
SUT VIC AB 3-0 SH 27X BRD (SUTURE) ×1 IMPLANT
SUTURE EHLN 3-0 FS-10 30 BLK (SUTURE) IMPLANT
SYR 20ML LL LF (SYRINGE) ×2 IMPLANT
SYR TB 1ML 27GX1/2 LL (SYRINGE) IMPLANT
TRAP FLUID SMOKE EVACUATOR (MISCELLANEOUS) ×1 IMPLANT
TRAY FOLEY MTR SLVR 16FR STAT (SET/KITS/TRAYS/PACK) ×1 IMPLANT
WATER STERILE IRR 500ML POUR (IV SOLUTION) ×1 IMPLANT

## 2022-04-26 NOTE — Anesthesia Procedure Notes (Signed)
Arterial Line Insertion Performed by: Arita Miss, MD, anesthesiologist  Patient location: OR. Preanesthetic checklist: patient identified, IV checked, site marked, risks and benefits discussed, surgical consent, monitors and equipment checked, pre-op evaluation, timeout performed and anesthesia consent Patient sedated Right, radial was placed Catheter size: 20 G Hand hygiene performed  and maximum sterile barriers used   Attempts: 1 Procedure performed using ultrasound guided technique. Ultrasound Notes:anatomy identified, needle tip was noted to be adjacent to the nerve/plexus identified and no ultrasound evidence of intravascular and/or intraneural injection Following insertion, dressing applied and Biopatch. Post procedure assessment: normal and unchanged  Patient tolerated the procedure well with no immediate complications.

## 2022-04-26 NOTE — ED Notes (Signed)
Surgeon Ferrel Logan currently at pt bedside, speaking with pt and wife over the phone.

## 2022-04-26 NOTE — ED Notes (Signed)
Patient to the OR with belongings.

## 2022-04-26 NOTE — ED Triage Notes (Signed)
Pt presents to the ED via [facility]  due to lower right abdominal pain that started last night. Pt states he will not go away. Pt denies NVD. Pt denies urinary symptoms. Pt is on dialysis [facility].

## 2022-04-26 NOTE — Op Note (Signed)
Preoperative diagnosis: Acute abdomen and perforated viscus.   Postoperative diagnosis: Perforated duodenal ulcer.  Procedure: Perforated duodenal ulcer repair and Phillip Heal patch.   Anesthesia: GETA  Surgeon: Dr. Windell Moment, MD  Wound Classification: Contaminated  Indications: Patient is a 67 y.o. male developed acute abdomen with free air on computed tomography scan of abdomen.   Findings:  1. Large quantity of bilious and purulent fluid identified onto entering the abdomen.  2. Perforated ulcer of the first portion of the duodenum 3. Adequate hemostasis  Description of procedure: The patient was placed in the supine position and general endotracheal anesthesia was induced. A time-out was completed verifying correct patient, procedure, site, positioning, and implant(s) and/or special equipment prior to beginning this procedure. Preoperative antibiotics were given. A Foley catheter and a nasogastric tube were placed. The abdomen was prepped and draped in the usual sterile fashion. A vertical midline incision was made from xiphoid to just below the umbilicus. This was deepened through the subcutaneous tissues and hemostasis was achieved with electrocautery. The linea alba was identified and incised and the peritoneal cavity entered. The abdomen was explored. Adhesions were lysed sharply under direct vision with Metzenbaum scissors. A large quantity of bilious and purulent turbid fluid was suctioned from the peritoneal cavity. The stomach and duodenum were inspected and palpated and a small anterior perforation of a duodenal ulcer was found just distal to the pylorus. The duodenal ulcer was repaired with interrupted 3-0 Vicryl. Four interrupted sutures of 2-0 silk were placed through healthy duodenal tissue in such a fashion as to span the perforation. These were left untied for now. A mobile portion of viable omentum was brought up, placed over the perforation, and secured in place by tying the  previously placed sutures over it in such a manner as to completely close the hole in the duodenum. Care was taken to avoid excess tension.  Hemostasis was checked. A 15 french drain was left in the right upper quadrant. The position of the nasogastric tube was verified. The abdomen was copiously lavaged with warm saline. The fascia was closed with a running suture of PDS 0. The skin was closed with skin staples.  The patient tolerated the procedure well and was taken to the postanesthesia care unit in stable condition.   Specimen: None  Complications: None  Estimated Blood Loss: 30 mL

## 2022-04-26 NOTE — Consult Note (Signed)
SURGICAL CONSULTATION NOTE   HISTORY OF PRESENT ILLNESS (HPI):  67 y.o. male presented to [facility] ED for evaluation of abdominal pain. Patient reports started having abdominal pains the last few days.  He endorses that last night the pain intensified I was very sharp in the right side of the abdomen.  He denies any nausea or vomiting.  Pain does not radiate to other part of body.  Pain is aggravated with moving abdominal cavity.  No alleviating factors.  In the ED he was found with acute abdomen with severe tenderness to palpation.  Vitals were stable.  There was elevated white blood cell count to 17,000.  Normal hemoglobin.  He had a CT of the abdomen appointment that shows pneumoperitoneum.  Unable to identify the specific location of perforation.  I personally evaluated the images.  Surgery is consulted by Dr. Domingo Dimes in this context for evaluation and management of intestinal perforation.  PAST MEDICAL HISTORY (PMH):  Past Medical History:  Diagnosis Date   Aortic atherosclerosis ([place])    Bilateral carotid artery disease ([place])    Bladder cancer ([place])    Coronary artery disease 12/20/2018   a.) LHC 12/20/2018: 50% OM1, 40% OM2, 95% o-pLAD, 75% o=pLCx, 40% mLM, 70% D1, 60% mRCA-1, 50% mRCA-2; refer to CVTS. b.) 4v CABG at [facility] on 12/27/2018: LIMA-LAD, RIMA-PDA, seg LRA-OM1-D1   DCM (dilated cardiomyopathy) ([place]) 12/05/2018   a.) TTE 12/05/2018: EF 40-45%. b.) TTE 12/28/2019: EF 20-25%.   ESRD (end stage renal disease) ([place])    a.) T-Th-Sat   HFrEF (heart failure with reduced ejection fraction) ([place]) 12/05/2018   a.) TTE 12/05/2018: EF 40-45%; mild LVH; ant/apical/sep HK; mild TR . b.) TTE 12/28/2019: EF 20-25%; mod LVH; mod MR/AR; G1DD.   History of 2019 novel coronavirus disease (COVID-19) 04/08/2021   History of kidney stones    HLD (hyperlipidemia)    Hypertension    Infrarenal abdominal aortic aneurysm (AAA) without rupture ([place]) 03/05/2021   a.) CT abd/pelvis; measured 3.2 cm.    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   a.) LIMA-LAD, RIMA-PDA, sequential LEFT radial artery to OM1 and D1   Sepsis ([place]) 03/14/2021   Wears glasses      PAST SURGICAL HISTORY ([place]):  Past Surgical History:  Procedure Laterality Date   AV FISTULA PLACEMENT Left 07/30/2021   Procedure: INSERTION OF ARTERIOVENOUS (AV) GORE-TEX GRAFT ARM BRACHIAL ARTERY TO AXILLARY VEIN;  Surgeon: Algernon Huxley, MD;  Location: [facility];  Service: Vascular;  Laterality: Left;   CAPD INSERTION N/A 12/31/2019   Procedure: LAPAROSCOPIC INSERTION CONTINUOUS AMBULATORY PERITONEAL DIALYSIS  (CAPD) CATHETER;  Surgeon: Jules Husbands, MD;  Location: [facility];  Service: General;  Laterality: N/A;   CAPD REMOVAL N/A 04/10/2020   Procedure: LAPAROSCOPIC REVISION OF CONTINUOUS AMBULATORY PERITONEAL DIALYSIS  (CAPD) CATHETER;  Surgeon: Jules Husbands, MD;  Location: [facility];  Service: General;  Laterality: N/A;   CORONARY ARTERY BYPASS GRAFT N/A 12/27/2018   Procedure: CORONARY ARTERY BYPASS GRAFTING (CABG) X 4 ON PUMP USING RIGHT & LEFT INTERNAL MAMMARY ARTERY LEFT RADIAL ARTERY ENDOSCOPICALLY HARVESTED;  Surgeon: [name], MD;  Location: [place];  Service: Open Heart Surgery;  Laterality: N/A;   CYSTOSCOPY W/ RETROGRADES Bilateral 05/15/2019   Procedure: CYSTOSCOPY WITH RETROGRADE PYELOGRAM;  Surgeon: Abbie Sons, MD;  Location: [facility];  Service: Urology;  Laterality: Bilateral;   CYSTOSCOPY WITH BIOPSY N/A 05/15/2019  Procedure: CYSTOSCOPY WITH bladder BIOPSY;  Surgeon: Abbie Sons, MD;  Location: [facility];  Service: Urology;  Laterality: N/A;   DIALYSIS/PERMA CATHETER INSERTION N/A 12/28/2019   Procedure: DIALYSIS/PERMA CATHETER INSERTION;  Surgeon: Algernon Huxley, MD;  Location: [place] [facility];  Service: Cardiovascular;  Laterality: N/A;   DIALYSIS/PERMA CATHETER INSERTION N/A 03/18/2021   Procedure: DIALYSIS/PERMA CATHETER INSERTION;  Surgeon:  Algernon Huxley, MD;  Location: [place] [facility];  Service: Cardiovascular;  Laterality: N/A;   DIALYSIS/PERMA CATHETER REMOVAL N/A 06/02/2020   Procedure: DIALYSIS/PERMA CATHETER REMOVAL;  Surgeon: Algernon Huxley, MD;  Location: [place] [facility];  Service: Cardiovascular;  Laterality: N/A;   EXCHANGE OF A DIALYSIS CATHETER Right 04/10/2020   Procedure: EXCHANGE OF A DIALYSIS CATHETER;  Surgeon: Jules Husbands, MD;  Location: [facility];  Service: General;  Laterality: Right;   [place]  01/20/2021   Procedure: HERNIA REPAIR INCISIONAL;  Surgeon: Olean Ree, MD;  Location: [facility];  Service: General;;   IR IMAGE GUIDED DRAINAGE PERCUT CATH  PERITONEAL RETROPERIT  04/07/2020   LAPAROSCOPY N/A 04/16/2021   Procedure: LAPAROSCOPY DIAGNOSTIC;  Surgeon: Benjamine Sprague, DO;  Location: [facility];  Service: General;  Laterality: N/A;   LEFT HEART CATH AND CORONARY ANGIOGRAPHY Left 12/20/2018   Procedure: LEFT HEART CATH AND CORONARY ANGIOGRAPHY;  Surgeon: Isaias Cowman, MD;  Location: [place] [facility];  Service: Cardiovascular;  Laterality: Left;   RADIAL ARTERY HARVEST Left 12/27/2018   Procedure: ENDOSCOPIC RADIAL ARTERY HARVEST;  Surgeon: [name], MD;  Location: [place];  Service: Open Heart Surgery;  Laterality: Left;   REMOVAL OF A DIALYSIS CATHETER Left 03/20/2021   Procedure: REMOVAL OF A PD CATHETER;  Surgeon: Algernon Huxley, MD;  Location: [facility];  Service: Vascular;  Laterality: Left;   REVISION OF ARTERIOVENOUS GORETEX GRAFT Left 09/11/2021   Procedure: Excisionof infected AV graft;  Surgeon: Katha Cabal, MD;  Location: [facility];  Service: Vascular;  Laterality: Left;   TEE WITHOUT CARDIOVERSION N/A 12/27/2018   Procedure: TRANSESOPHAGEAL ECHOCARDIOGRAM (TEE);  Surgeon: [name], MD;  Location: [place];  Service: Open Heart Surgery;  Laterality: N/A;   TUMOR REMOVAL  2019   Bladder     MEDICATIONS:  Prior to Admission medications   Medication  Sig Start Date End Date Taking? Authorizing Provider  acetaminophen (TYLENOL) 500 MG tablet Take 2 tablets (1,000 mg total) by mouth every 6 (six) hours as needed for mild pain, fever or headache (or Fever >/= 101). 11/18/21   Emeterio Reeve, DO  apixaban (ELIQUIS) 5 MG TABS tablet Take 2 tablets (10 mg total) by mouth 2 (two) times daily for 7 days. 04/21/22 04/28/22  Jennye Boroughs, MD  apixaban (ELIQUIS) 5 MG TABS tablet Take 1 tablet (5 mg total) by mouth 2 (two) times daily. 04/28/22   Jennye Boroughs, MD  aspirin EC 81 MG tablet Take 1 tablet (81 mg total) by mouth daily. Swallow whole. Patient not taking: Reported on 04/19/2022 11/19/21   Emeterio Reeve, DO  atorvastatin (LIPITOR) 80 MG tablet Take 1 tablet (80 mg total) by mouth daily. 04/21/22   Jennye Boroughs, MD  calcitRIOL (ROCALTROL) 0.5 MCG capsule Take 1 capsule (0.5 mcg total) by mouth daily. 11/18/21   Emeterio Reeve, DO  calcium acetate (PHOSLO) 667 MG capsule Take 1 capsule (667 mg total) by mouth 3 (three) times daily with meals. 11/18/21   Emeterio Reeve, DO  hydrALAZINE (APRESOLINE) 25 MG tablet Take  1 tablet (25 mg total) by mouth 3 (three) times daily. 04/21/22   Jennye Boroughs, MD  isosorbide dinitrate (ISORDIL) 10 MG tablet Take 1 tablet (10 mg total) by mouth 3 (three) times daily. 04/21/22   Jennye Boroughs, MD  metoprolol succinate (TOPROL-XL) 25 MG 24 hr tablet Take 25 mg by mouth daily. 03/12/22   [provider]  multivitamin (RENA-VIT) TABS tablet Take 1 tablet by mouth at bedtime. 03/23/21   Ezekiel Slocumb, DO  sacubitril-valsartan (ENTRESTO) 24-26 MG Take 1 tablet by mouth 2 (two) times daily. Patient not taking: Reported on 04/19/2022    [provider]  sucroferric oxyhydroxide (VELPHORO) 500 MG chewable tablet Chew 1 tablet (500 mg total) by mouth 3 (three) times daily with meals. 11/18/21   Emeterio Reeve, DO  Vitamin D, Ergocalciferol, (DRISDOL) 1.25 MG (50000 UNIT) CAPS capsule Take 1  capsule (50,000 Units total) by mouth every 7 (seven) days. 11/18/21   Emeterio Reeve, DO     ALLERGIES:  No Known Allergies   SOCIAL HISTORY:  Social History   Socioeconomic History   Marital status: Married    Spouse name: Not on file   Number of children: Not on file   Years of education: Not on file   Highest education level: Not on file  Occupational History   Not on file  Tobacco Use   Smoking status: Former    Packs/day: 0.25    Types: Cigarettes    Quit date: 02/19/2019    Years since quitting: 3.1   Smokeless tobacco: Never  Vaping Use   Vaping Use: Never used  Substance and Sexual Activity   Alcohol use: Never   Drug use: Never   Sexual activity: Yes  Other Topics Concern   Not on file  Social History Narrative   Not on file   Social Determinants of Health   Financial Resource Strain: Not on file  Food Insecurity: No Food Insecurity (04/19/2022)   Hunger Vital Sign    Worried About Running Out of Food in the Last Year: Never true    Ran Out of Food in the Last Year: Never true  Transportation Needs: No Transportation Needs (04/19/2022)   PRAPARE - Hydrologist (Medical): No    Lack of Transportation (Non-Medical): No  Physical Activity: Not on file  Stress: Not on file  Social Connections: Not on file  Intimate Partner Violence: Not At Risk (04/19/2022)   Humiliation, Afraid, Rape, and Kick questionnaire    Fear of Current or Ex-Partner: No    Emotionally Abused: No    Physically Abused: No    Sexually Abused: No      FAMILY HISTORY:  Family History  Family history unknown: Yes     REVIEW OF SYSTEMS:  Constitutional: denies weight loss, fever, chills, or sweats  Eyes: denies any other vision changes, history of eye injury  ENT: denies sore throat, hearing problems  Respiratory: denies shortness of breath, wheezing  Cardiovascular: denies chest pain, palpitations  Gastrointestinal: Positive abdominal  pain Genitourinary: denies burning with urination or urinary frequency Musculoskeletal: denies any other joint pains or cramps  Skin: denies any other rashes or skin discolorations  Neurological: denies any other headache, dizziness, weakness  Psychiatric: denies any other depression, anxiety   All other review of systems were negative   VITAL SIGNS:  Temp:  [98.5 F (36.9 C)] 98.5 F (36.9 C) (03/04 1517) Pulse Rate:  [71-84] 84 (03/04 1700) Resp:  [18-30]  30 (03/04 1700) BP: (174-206)/(116-132) 206/132 (03/04 1700) SpO2:  [84 %-97 %] 84 % (03/04 1700)             INTAKE/OUTPUT:  This shift: No intake/output data recorded.  Last 2 shifts: '@IOLAST2SHIFTS'$ @   PHYSICAL EXAM:  Constitutional:  -- Normal body habitus  -- Awake, alert, and oriented x3  Eyes:  -- Pupils equally round and reactive to light  -- No scleral icterus  Ear, nose, and throat:  -- No jugular venous distension  Pulmonary:  -- No crackles  -- Equal breath sounds bilaterally -- Breathing non-labored at rest Cardiovascular:  -- S1, S2 present  -- No pericardial rubs Gastrointestinal:  -- Abdomen soft, tender to palpation in all quadrants, non-distended, with guarding and rebound tenderness -- No abdominal masses appreciated, pulsatile or otherwise  Musculoskeletal and Integumentary:  -- Wounds: None appreciated -- Extremities: B/L UE and LE FROM, hands and feet warm, no edema  Neurologic:  -- Motor function: intact and symmetric -- Sensation: intact and symmetric   Labs:     Latest Ref Rng & Units 04/26/2022    3:18 PM 04/21/2022    2:55 AM 04/20/2022    8:07 AM  CBC  WBC 4.0 - 10.5 K/uL 17.2  9.4  8.8   Hemoglobin 13.0 - 17.0 g/dL 12.4  12.1  11.6   Hematocrit 39.0 - 52.0 % 40.9  39.4  38.3   Platelets 150 - 400 K/uL 323  285  267       Latest Ref Rng & Units 04/26/2022    3:18 PM 04/21/2022    2:55 AM 04/20/2022    8:07 AM  CMP  Glucose 70 - 99 mg/dL 98  103  85   BUN 8 - 23 mg/dL 77  44  46    Creatinine 0.61 - 1.24 mg/dL 9.90  5.00  6.37   Sodium 135 - 145 mmol/L 138  139  138   Potassium 3.5 - 5.1 mmol/L 5.9  4.2  4.7   Chloride 98 - 111 mmol/L 102  103  98   CO2 22 - 32 mmol/L '17  24  25   '$ Calcium 8.9 - 10.3 mg/dL 7.4  7.3  6.9   Total Protein 6.5 - 8.1 g/dL 7.2     Total Bilirubin 0.3 - 1.2 mg/dL 0.5     Alkaline Phos 38 - 126 U/L 52     AST 15 - 41 U/L 15     ALT 0 - 44 U/L 15       Imaging studies:  EXAM: CT ABDOMEN AND PELVIS WITHOUT CONTRAST   TECHNIQUE: Multidetector CT imaging of the abdomen and pelvis was performed following the standard protocol without IV contrast.   RADIATION DOSE REDUCTION: This exam was performed according to the departmental dose-optimization program which includes automated exposure control, adjustment of the mA and/or kV according to patient size and/or use of iterative reconstruction technique.   COMPARISON:  CT abdomen pelvis dated 04/19/2022.   FINDINGS: Evaluation of this exam is limited in the absence of intravenous contrast. Evaluation is also limited due to respiratory motion and paucity of intra-abdominal fat.   Lower chest: The visualized lung bases are clear.   There is pneumoperitoneum and small ascites.   Hepatobiliary: A 2 cm hypodense lesion in the dome of the liver not characterized on this CT but was present on the prior CT. Small hypodense lesion in the left lobe of the liver also present on the  prior CT. A small cyst noted in the right lobe 1 of the liver inferiorly. No dilatation. There is layering sludge and small stones within the gallbladder. No pericholecystic fluid.   Pancreas: The pancreas is grossly unremarkable as visualized.   Spleen: Normal in size without focal abnormality.   Adrenals/Urinary Tract: The adrenal glands are unremarkable. Moderate bilateral renal parenchyma atrophy. There is no hydronephrosis or nephrolithiasis on either side. The urinary bladder is minimally distended and  grossly unremarkable.   Stomach/Bowel: There is no bowel obstruction. The appendix is unremarkable as visualized.   Vascular/Lymphatic: Advanced aortoiliac atherosclerotic disease. The aorta is tortuous. The IVC is grossly unremarkable. No obvious adenopathy.   Reproductive: Mildly enlarged prostate gland measuring 4.5 cm in transverse axial diameter.   Other: None   Musculoskeletal: Degenerative changes of the spine and scoliosis. No acute osseous pathology.   IMPRESSION: 1. Pneumoperitoneum and small ascites. Findings are concerning for bowel perforation. Surgical consult is advised. 2. Cholelithiasis. 3. No hydronephrosis or nephrolithiasis. 4. No bowel obstruction. Normal appendix. 5.  Aortic Atherosclerosis (ICD10-I70.0).   These results were called by telephone at the time of interpretation on 04/26/2022 at 4:49 pm to provider [facility] , who verbally acknowledged these results.     Electronically Signed   By: Anner Crete M.D.   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  Patient and wife oriented about new finding of pneumoperitoneum.  There were oriented about the concern of intestinal perforation.  I discussed with patient the possible causes of perforation coming from gastric ulcers, duodenal ulcer, diverticulitis, appendicitis, perforation of the intestine, ischemia, among others possible causes of pneumoperitoneum.  I discussed with patient that this is a life-threatening condition and emergency surgery is indicated.  I discussed with patient and her wife the high risk for surgery on him due to his recent acute and chronic medical history.  He is high risk for myocardial infarction, respiratory failure, developing DVTs and worsening PE, stroke, among others.  Discussed  with patient high risk for prolonged mechanical ventilation.  I have discussed with patient the risk of death.  At this point due to the acute abdomen and imaging concerning with intestinal perforation the benefit of surgery outweighs the risk but it is definitely an unfortunate situation.  I ordered Kcentra for reversal of Eliquis that patient took today.  He has been having issues with uncontrolled bleeding since he was started so risk of bleeding is high in this patient.  Will take to the OR on emergent basis.   Arnold Long, MD

## 2022-04-26 NOTE — ED Notes (Signed)
X-ray at bedside

## 2022-04-26 NOTE — H&P (Addendum)
[facility] History and Physical   Patient: Anthony Hess [id number]   PCP: Patient, No Pcp Per DOB: [birth year]   DOA: 04/26/2022   DOS: 04/26/2022   DOS: the patient was seen and examined on 04/26/2022***04/26/2022  Patient coming from: The patient is coming from {comingfrom:22515}  Chief Complaint: ***  HPI: Anthony Hess is a 67 y.o. male with Past medical history of ***. ***  ED Course: ***  At his baseline *** ***independent for most of his ADL;  ***manages his medication on his own.  Review of Systems: as mentioned in the history of present illness.  All other systems reviewed and are negative.  Past Medical History:  Diagnosis Date   Aortic atherosclerosis ([place])    Bilateral carotid artery disease ([place])    Bladder cancer ([place])    Coronary artery disease 12/20/2018   a.) LHC 12/20/2018: 50% OM1, 40% OM2, 95% o-pLAD, 75% o=pLCx, 40% mLM, 70% D1, 60% mRCA-1, 50% mRCA-2; refer to CVTS. b.) 4v CABG at [facility] on 12/27/2018: LIMA-LAD, RIMA-PDA, seg LRA-OM1-D1   DCM (dilated cardiomyopathy) ([place]) 12/05/2018   a.) TTE 12/05/2018: EF 40-45%. b.) TTE 12/28/2019: EF 20-25%.   ESRD (end stage renal disease) ([place])    a.) T-Th-Sat   HFrEF (heart failure with reduced ejection fraction) ([place]) 12/05/2018   a.) TTE 12/05/2018: EF 40-45%; mild LVH; ant/apical/sep HK; mild TR . b.) TTE 12/28/2019: EF 20-25%; mod LVH; mod MR/AR; G1DD.   History of 2019 novel coronavirus disease (COVID-19) 04/08/2021   History of kidney stones    HLD (hyperlipidemia)    Hypertension    Infrarenal abdominal aortic aneurysm (AAA) without rupture ([place]) 03/05/2021   a.) CT abd/pelvis; measured 3.2 cm.   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   a.) LIMA-LAD, RIMA-PDA, sequential LEFT radial artery to OM1 and D1   Sepsis ([place]) 03/14/2021   Wears glasses    Past Surgical History:  Procedure Laterality Date   AV FISTULA PLACEMENT Left 07/30/2021    Procedure: INSERTION OF ARTERIOVENOUS (AV) GORE-TEX GRAFT ARM BRACHIAL ARTERY TO AXILLARY VEIN;  Surgeon: Algernon Huxley, MD;  Location: [facility];  Service: Vascular;  Laterality: Left;   CAPD INSERTION N/A 12/31/2019   Procedure: LAPAROSCOPIC INSERTION CONTINUOUS AMBULATORY PERITONEAL DIALYSIS  (CAPD) CATHETER;  Surgeon: Jules Husbands, MD;  Location: [facility];  Service: General;  Laterality: N/A;   CAPD REMOVAL N/A 04/10/2020   Procedure: LAPAROSCOPIC REVISION OF CONTINUOUS AMBULATORY PERITONEAL DIALYSIS  (CAPD) CATHETER;  Surgeon: Jules Husbands, MD;  Location: [facility];  Service: General;  Laterality: N/A;   CORONARY ARTERY BYPASS GRAFT N/A 12/27/2018   Procedure: CORONARY ARTERY BYPASS GRAFTING (CABG) X 4 ON PUMP USING RIGHT & LEFT INTERNAL MAMMARY ARTERY LEFT RADIAL ARTERY ENDOSCOPICALLY HARVESTED;  Surgeon: [name], MD;  Location: [place];  Service: Open Heart Surgery;  Laterality: N/A;   CYSTOSCOPY W/ RETROGRADES Bilateral 05/15/2019   Procedure: CYSTOSCOPY WITH RETROGRADE PYELOGRAM;  Surgeon: Abbie Sons, MD;  Location: [facility];  Service: Urology;  Laterality: Bilateral;   CYSTOSCOPY WITH BIOPSY N/A 05/15/2019   Procedure: CYSTOSCOPY WITH bladder BIOPSY;  Surgeon: Abbie Sons, MD;  Location: [facility];  Service: Urology;  Laterality: N/A;   DIALYSIS/PERMA CATHETER INSERTION N/A 12/28/2019   Procedure: DIALYSIS/PERMA CATHETER INSERTION;  Surgeon: Algernon Huxley, MD;  Location: [place] [facility];  Service: Cardiovascular;  Laterality: N/A;   DIALYSIS/PERMA CATHETER INSERTION N/A 03/18/2021  Procedure: DIALYSIS/PERMA CATHETER INSERTION;  Surgeon: Algernon Huxley, MD;  Location: [place] [facility];  Service: Cardiovascular;  Laterality: N/A;   DIALYSIS/PERMA CATHETER REMOVAL N/A 06/02/2020   Procedure: DIALYSIS/PERMA CATHETER REMOVAL;  Surgeon: Algernon Huxley, MD;  Location: [place] [facility];  Service: Cardiovascular;  Laterality: N/A;   EXCHANGE OF A DIALYSIS CATHETER Right  04/10/2020   Procedure: EXCHANGE OF A DIALYSIS CATHETER;  Surgeon: Jules Husbands, MD;  Location: [facility];  Service: General;  Laterality: Right;   [place]  01/20/2021   Procedure: HERNIA REPAIR INCISIONAL;  Surgeon: Olean Ree, MD;  Location: [facility];  Service: General;;   IR IMAGE GUIDED DRAINAGE PERCUT CATH  PERITONEAL RETROPERIT  04/07/2020   LAPAROSCOPY N/A 04/16/2021   Procedure: LAPAROSCOPY DIAGNOSTIC;  Surgeon: Benjamine Sprague, DO;  Location: [facility];  Service: General;  Laterality: N/A;   LEFT HEART CATH AND CORONARY ANGIOGRAPHY Left 12/20/2018   Procedure: LEFT HEART CATH AND CORONARY ANGIOGRAPHY;  Surgeon: Isaias Cowman, MD;  Location: [place] [facility];  Service: Cardiovascular;  Laterality: Left;   RADIAL ARTERY HARVEST Left 12/27/2018   Procedure: ENDOSCOPIC RADIAL ARTERY HARVEST;  Surgeon: [name], MD;  Location: [place];  Service: Open Heart Surgery;  Laterality: Left;   REMOVAL OF A DIALYSIS CATHETER Left 03/20/2021   Procedure: REMOVAL OF A PD CATHETER;  Surgeon: Algernon Huxley, MD;  Location: [facility];  Service: Vascular;  Laterality: Left;   REVISION OF ARTERIOVENOUS GORETEX GRAFT Left 09/11/2021   Procedure: Excisionof infected AV graft;  Surgeon: Katha Cabal, MD;  Location: [facility];  Service: Vascular;  Laterality: Left;   TEE WITHOUT CARDIOVERSION N/A 12/27/2018   Procedure: TRANSESOPHAGEAL ECHOCARDIOGRAM (TEE);  Surgeon: [name], MD;  Location: [place];  Service: Open Heart Surgery;  Laterality: N/A;   TUMOR REMOVAL  2019   Bladder   Social History:  reports that he quit smoking about 3 years ago. His smoking use included cigarettes. He smoked an average of .25 packs per day. He has never used smokeless tobacco. He reports that he does not drink alcohol and does not use drugs.  No Known Allergies  *** Family history reviewed and not pertinent Family History  Family history unknown: Yes     Prior to Admission  medications   Medication Sig Start Date End Date Taking? Authorizing Provider  acetaminophen (TYLENOL) 500 MG tablet Take 2 tablets (1,000 mg total) by mouth every 6 (six) hours as needed for mild pain, fever or headache (or Fever >/= 101). 11/18/21   Emeterio Reeve, DO  apixaban (ELIQUIS) 5 MG TABS tablet Take 2 tablets (10 mg total) by mouth 2 (two) times daily for 7 days. 04/21/22 04/28/22  Jennye Boroughs, MD  apixaban (ELIQUIS) 5 MG TABS tablet Take 1 tablet (5 mg total) by mouth 2 (two) times daily. 04/28/22   Jennye Boroughs, MD  aspirin EC 81 MG tablet Take 1 tablet (81 mg total) by mouth daily. Swallow whole. Patient not taking: Reported on 04/19/2022 11/19/21   Emeterio Reeve, DO  atorvastatin (LIPITOR) 80 MG tablet Take 1 tablet (80 mg total) by mouth daily. 04/21/22   Jennye Boroughs, MD  calcitRIOL (ROCALTROL) 0.5 MCG capsule Take 1 capsule (0.5 mcg total) by mouth daily. 11/18/21   Emeterio Reeve, DO  calcium acetate (PHOSLO) 667 MG capsule Take 1 capsule (667 mg total) by mouth 3 (three) times daily with meals. 11/18/21   Emeterio Reeve, DO  hydrALAZINE (APRESOLINE) 25  MG tablet Take 1 tablet (25 mg total) by mouth 3 (three) times daily. 04/21/22   Jennye Boroughs, MD  isosorbide dinitrate (ISORDIL) 10 MG tablet Take 1 tablet (10 mg total) by mouth 3 (three) times daily. 04/21/22   Jennye Boroughs, MD  metoprolol succinate (TOPROL-XL) 25 MG 24 hr tablet Take 25 mg by mouth daily. 03/12/22   [provider]  multivitamin (RENA-VIT) TABS tablet Take 1 tablet by mouth at bedtime. 03/23/21   Ezekiel Slocumb, DO  sacubitril-valsartan (ENTRESTO) 24-26 MG Take 1 tablet by mouth 2 (two) times daily. Patient not taking: Reported on 04/19/2022    [provider]  sucroferric oxyhydroxide (VELPHORO) 500 MG chewable tablet Chew 1 tablet (500 mg total) by mouth 3 (three) times daily with meals. 11/18/21   Emeterio Reeve, DO  Vitamin D, Ergocalciferol, (DRISDOL) 1.25 MG (50000  UNIT) CAPS capsule Take 1 capsule (50,000 Units total) by mouth every 7 (seven) days. 11/18/21   Emeterio Reeve, DO    Physical Exam: Vitals:   04/26/22 1556 04/26/22 1557 04/26/22 1558 04/26/22 1559  BP:   (!) 195/116   Pulse: 78   71  Resp: 20 20 (!) 21 (!) 21  Temp:      TempSrc:      SpO2: (!) 89%   94%    General: {EXAM; NEURO ALERTNESS:23097} and {EXAM; NEURO PED ORIENTATION:18734}. Appear in {DEGREE - MILD, MOD, SEV:22033} distress, affect {Exam; psychiatric affect:30301} Eyes: PERRL***, Conjunctiva normal ENT: Oral Mucosa {oral exam:22516}  Neck: *** JVD, *** Abnormal Mass Or lumps Cardiovascular: S1 and S2 Present, *** Murmur, {peripheral pulses:323011} Respiratory: {Desc; increased/descreased:10091} respiratory effort, Bilateral Air entry equal and Decreased, {Signs/no:32574} of accessory muscle use, ***Clear to Auscultation, *** Crackles, *** wheezes Abdomen: Bowel Sound ***sent, Soft and *** tenderness, *** hernia Skin: {skin exampmp:22769} *** Extremities: *** Pedal edema, *** calf tenderness Neurologic: {neuro detail prnv:22517::"without any new focal findings"} Gait not checked due to patient safety concerns  Data Reviewed: I have personally reviewed and interpreted labs, imaging as discussed below.  CBC: Recent Labs  Lab 04/20/22 0807 04/21/22 0255 04/26/22 1518  WBC 8.8 9.4 17.2*  HGB 11.6* 12.1* 12.4*  HCT 38.3* 39.4 40.9  MCV 93.4 93.1 95.6  PLT 267 285 [id number]   Basic Metabolic Panel: Recent Labs  Lab 04/19/22 2139 04/20/22 0807 04/21/22 0255 04/26/22 1518  NA  --  138 139 138  K 3.9 4.7 4.2 5.9*  CL  --  98 103 102  CO2  --  25 24 17*  GLUCOSE  --  85 103* 98  BUN  --  46* 44* 77*  CREATININE  --  6.37* 5.00* 9.90*  CALCIUM  --  6.9* 7.3* 7.4*   GFR: Estimated Creatinine Clearance: 5.7 mL/min (A) (by C-G formula based on SCr of 9.9 mg/dL (H)). Liver Function Tests: Recent Labs  Lab 04/20/22 0806 04/26/22 1518  AST  --  15  ALT  --   15  ALKPHOS  --  52  BILITOT  --  0.5  PROT  --  7.2  ALBUMIN 3.3* 3.8   Recent Labs  Lab 04/26/22 1518  LIPASE 38   No results for input(s): "AMMONIA" in the last 168 hours. Coagulation Profile: No results for input(s): "INR", "PROTIME" in the last 168 hours. Cardiac Enzymes: No results for input(s): "CKTOTAL", "CKMB", "CKMBINDEX", "TROPONINI" in the last 168 hours. BNP (last 3 results) No results for input(s): "PROBNP" in the last 8760 hours. HbA1C: No results for  input(s): "HGBA1C" in the last 72 hours. CBG: No results for input(s): "GLUCAP" in the last 168 hours. Lipid Profile: No results for input(s): "CHOL", "HDL", "LDLCALC", "TRIG", "CHOLHDL", "LDLDIRECT" in the last 72 hours. Thyroid Function Tests: No results for input(s): "TSH", "T4TOTAL", "FREET4", "T3FREE", "THYROIDAB" in the last 72 hours. Anemia Panel: No results for input(s): "VITAMINB12", "FOLATE", "FERRITIN", "TIBC", "IRON", "RETICCTPCT" in the last 72 hours. Urine analysis:    Component Value Date/Time   COLORURINE STRAW (A) 11/16/2021 1135   APPEARANCEUR CLEAR (A) 11/16/2021 1135   APPEARANCEUR Cloudy (A) 05/08/2019 1448   LABSPEC 1.009 11/16/2021 1135   PHURINE 7.0 11/16/2021 1135   GLUCOSEU 150 (A) 11/16/2021 1135   HGBUR NEGATIVE 11/16/2021 1135   BILIRUBINUR NEGATIVE 11/16/2021 1135   BILIRUBINUR Negative 05/08/2019 1448   KETONESUR NEGATIVE 11/16/2021 1135   PROTEINUR 100 (A) 11/16/2021 1135   NITRITE NEGATIVE 11/16/2021 1135   LEUKOCYTESUR NEGATIVE 11/16/2021 1135    Radiological Exams on Admission: CT ABDOMEN PELVIS WO CONTRAST  Result Date: 04/26/2022 CLINICAL DATA:  Right lower quadrant abdominal pain. EXAM: CT ABDOMEN AND PELVIS WITHOUT CONTRAST TECHNIQUE: Multidetector CT imaging of the abdomen and pelvis was performed following the standard protocol without IV contrast. RADIATION DOSE REDUCTION: This exam was performed according to the departmental dose-optimization program which includes  automated exposure control, adjustment of the mA and/or kV according to patient size and/or use of iterative reconstruction technique. COMPARISON:  CT abdomen pelvis dated 04/19/2022. FINDINGS: Evaluation of this exam is limited in the absence of intravenous contrast. Evaluation is also limited due to respiratory motion and paucity of intra-abdominal fat. Lower chest: The visualized lung bases are clear. There is pneumoperitoneum and small ascites. Hepatobiliary: A 2 cm hypodense lesion in the dome of the liver not characterized on this CT but was present on the prior CT. Small hypodense lesion in the left lobe of the liver also present on the prior CT. A small cyst noted in the right lobe 1 of the liver inferiorly. No dilatation. There is layering sludge and small stones within the gallbladder. No pericholecystic fluid. Pancreas: The pancreas is grossly unremarkable as visualized. Spleen: Normal in size without focal abnormality. Adrenals/Urinary Tract: The adrenal glands are unremarkable. Moderate bilateral renal parenchyma atrophy. There is no hydronephrosis or nephrolithiasis on either side. The urinary bladder is minimally distended and grossly unremarkable. Stomach/Bowel: There is no bowel obstruction. The appendix is unremarkable as visualized. Vascular/Lymphatic: Advanced aortoiliac atherosclerotic disease. The aorta is tortuous. The IVC is grossly unremarkable. No obvious adenopathy. Reproductive: Mildly enlarged prostate gland measuring 4.5 cm in transverse axial diameter. Other: None Musculoskeletal: Degenerative changes of the spine and scoliosis. No acute osseous pathology. IMPRESSION: 1. Pneumoperitoneum and small ascites. Findings are concerning for bowel perforation. Surgical consult is advised. 2. Cholelithiasis. 3. No hydronephrosis or nephrolithiasis. 4. No bowel obstruction. Normal appendix. 5.  Aortic Atherosclerosis (ICD10-I70.0). These results were called by telephone at the time of  interpretation on 04/26/2022 at 4:49 pm to provider [facility] , who verbally acknowledged these results. Electronically Signed   By: Anner Crete M.D.   On: 04/26/2022 16:51   DG Chest Portable 1 View  Result Date: 04/26/2022 CLINICAL DATA:  Shortness of breath, abdominal pain EXAM: PORTABLE CHEST 1 VIEW COMPARISON:  Previous studies including the examination of 04/19/2022 FINDINGS: Transverse diameter of heart is slightly increased. Central pulmonary vessels are slightly prominent. Thoracic aorta is tortuous and ectatic. Tip of dialysis catheter is seen at the junction of superior vena cava  and right atrium. There is previous coronary bypass surgery. Faint lucency is seen in the right hemidiaphragm. Patient is scheduled for CT abdomen and pelvis. Dextroscoliosis is seen in thoracic spine. IMPRESSION: There are no signs of pulmonary edema or focal pulmonary consolidation. There is no pleural effusion or pneumothorax. Faint lucencies are seen under the right hemidiaphragm suggesting possible pneumoperitoneum. Electronically Signed   By: Elmer Picker M.D.   On: 04/26/2022 16:43   ***EKG: Independently reviewed. {ekg findings:315101::"normal EKG, normal sinus rhythm","unchanged from previous tracings"}. ***Echocardiogram: ***  I reviewed all nursing notes, pharmacy notes, vitals, pertinent old records.  Assessment/Plan 1. Perforation bowel (HCC) ***  2.***  ***  Principal Problem:   Perforation bowel (HCC)   3. ***Type 2 Diabetes Mellitus, {DESC; WELL/MODERATELY/POORLY:30679} controled with*** complication last hemoglobin A1c was ***, ***Check hemoglobin A1c. Blood glucose {DESC; WELL/MODERATELY/POORLY:30679}  -***hold his oral hypoglycemic agents. -On insulin sliding scale moderate***sensitive***resistant ***with Basal insulin ***.  4.***{afib:23014} CHA2DS2-VASc Score 1 point each [CHF, HTN, DM, Vascular=MI/PAD/Aortic Plaque, Age if 35-74, or Male] 2 points each [Age >  75, or Stroke/TIA/TE]  -Patient is currently in *** at *** rate. -We'll continue ***   Nutrition: {dietplan:22518} DVT Prophylaxis: {DVTprophylaxisprnv:22509}  Advance goals of care discussion: {Palliative Code status:23503} ***  Consults: ***I personally Discussed with ***   Family Communication: ***family was present at bedside, at the time of interview.  ***Opportunity was given to ask question and all questions were answered satisfactorily.  Disposition: Admitted as observation***inpatient, telemetry***step-down***med-surge unit. Likely to be discharged ***, in *** days ***.  I have discussed plan of care as described above with RN and patient/family.  Severity of Illness: {Observation/Inpatient:21159}   Author: Val Riles, MD [facility] 04/26/2022 5:46 PM   To reach On-call, see care teams to locate the attending and reach out to them via [email or web address]. If 7PM-7AM, please contact night-coverage If you still have difficulty reaching the attending provider, please page the [facility] (Director on Call) for [facility] on amion for assistance.

## 2022-04-26 NOTE — Anesthesia Preprocedure Evaluation (Signed)
Anesthesia Evaluation  Patient identified by MRN, date of birth, ID band Patient awake  General Assessment Comment:  Patient with perforated viscus, free air on CT in abdomen. Potassium = 5.9. S/p calcium gluconate and sodium bicarbonate administration.  Reviewed: Allergy & Precautions, NPO status , Patient's Chart, lab work & pertinent test results, reviewed documented beta blocker date and time   Airway Mallampati: III  TM Distance: >3 FB Neck ROM: full    Dental  (+) Poor Dentition, Missing, Edentulous Upper, Chipped   Pulmonary COPD, Current SmokerPatient did not abstain from smoking., PE On Eliquis for recently diagnosed PE.   Pulmonary exam normal breath sounds clear to auscultation       Cardiovascular Exercise Tolerance: Good hypertension, Pt. on medications and Pt. on home beta blockers + CAD, + Past MI, + CABG, + Peripheral Vascular Disease and +CHF  Normal cardiovascular exam+ dysrhythmias Atrial Fibrillation  Rhythm:Regular Rate:Normal  TTE 03/2022:  1. Left ventricular ejection fraction, by estimation, is 55 to 60%. The  left ventricle has normal function. The left ventricle has no regional  wall motion abnormalities. There is moderate concentric left ventricular  hypertrophy. Left ventricular  diastolic parameters are consistent with Grade I diastolic dysfunction  (impaired relaxation).   2. Right ventricular systolic function is normal. The right ventricular  size is normal. There is normal pulmonary artery systolic pressure.   3. The mitral valve is normal in structure. No evidence of mitral valve  regurgitation.   4. The aortic valve is tricuspid. Aortic valve regurgitation is not  visualized. Aortic valve sclerosis/calcification is present, without any  evidence of aortic stenosis.   5. The inferior vena cava is normal in size with greater than 50%  respiratory variability, suggesting right atrial pressure of  3 mmHg.     Neuro/Psych negative neurological ROS  negative psych ROS   GI/Hepatic negative GI ROS, Neg liver ROS,,,  Endo/Other  negative endocrine ROS    Renal/GU Dialysis and ESRFRenal diseaseLast dialyzed saturday     Musculoskeletal   Abdominal Normal abdominal exam  (+)  Abdomen: tender.   Peds  Hematology  (+) Blood dyscrasia, anemia   Anesthesia Other Findings Past Medical History: No date: Aortic atherosclerosis (HCC) No date: Bilateral carotid artery disease ([place]) No date: Bladder cancer ([place]) 12/20/2018: Coronary artery disease     Comment:  a.) LHC 12/20/2018: 50% OM1, 40% OM2, 95% o-pLAD, 75%               o=pLCx, 40% mLM, 70% D1, 60% mRCA-1, 50% mRCA-2; refer to              CVTS. b.) 4v CABG at [facility] on 12/27/2018: LIMA-LAD,               RIMA-PDA, seg LRA-OM1-D1 12/05/2018: DCM (dilated cardiomyopathy) ([place])     Comment:  a.) TTE 12/05/2018: EF 40-45%. b.) TTE 12/28/2019: EF               20-25%. No date: ESRD (end stage renal disease) ([place])     Comment:  a.) T-Th-Sat 12/05/2018: HFrEF (heart failure with reduced ejection fraction) ([place])     Comment:  a.) TTE 12/05/2018: EF 40-45%; mild LVH; ant/apical/sep               HK; mild TR . b.) TTE 12/28/2019: EF 20-25%; mod LVH; mod              MR/AR; G1DD. 04/08/2021: History of 2019 novel coronavirus disease (  COVID-19) No date: History of kidney stones No date: HLD (hyperlipidemia) No date: Hypertension 03/05/2021: Infrarenal abdominal aortic aneurysm (AAA) without  rupture (HCC)     Comment:  a.) CT abd/pelvis; measured 3.2 cm. No date: Myocardial infarction [facility]) No date: PVD (peripheral vascular disease) ([place]) 12/27/2018: S/P CABG x 4     Comment:  a.) LIMA-LAD, RIMA-PDA, sequential LEFT radial artery to              OM1 and D1 03/14/2021: Sepsis ([place]) No date: Wears glasses  Past Surgical History: 07/30/2021: AV FISTULA PLACEMENT; Left     Comment:  Procedure: INSERTION OF ARTERIOVENOUS (AV)  GORE-TEX               GRAFT ARM BRACHIAL ARTERY TO AXILLARY VEIN;  Surgeon:               Algernon Huxley, MD;  Location: [facility] ORS;  Service:               Vascular;  Laterality: Left; 12/31/2019: CAPD INSERTION; N/A     Comment:  Procedure: [place]  (CAPD) CATHETER;  Surgeon: Jules Husbands, MD;  Location: [facility] ORS;  Service: General;                Laterality: N/A; 04/10/2020: CAPD REMOVAL; N/A     Comment:  Procedure: LAPAROSCOPIC REVISION OF CONTINUOUS               AMBULATORY PERITONEAL DIALYSIS  (CAPD) CATHETER;                Surgeon: Jules Husbands, MD;  Location: [facility];                Service: General;  Laterality: N/A; 12/27/2018: CORONARY ARTERY BYPASS GRAFT; N/A     Comment:  Procedure: CORONARY ARTERY BYPASS GRAFTING (CABG) X 4 ON              PUMP USING RIGHT & LEFT INTERNAL MAMMARY ARTERY LEFT               RADIAL ARTERY ENDOSCOPICALLY HARVESTED;  Surgeon: [name], MD;  Location: [place];  Service: Open Heart               Surgery;  Laterality: N/A; 05/15/2019: CYSTOSCOPY W/ RETROGRADES; Bilateral     Comment:  Procedure: CYSTOSCOPY WITH RETROGRADE PYELOGRAM;                Surgeon: Abbie Sons, MD;  Location: [facility];                Service: Urology;  Laterality: Bilateral; 05/15/2019: CYSTOSCOPY WITH BIOPSY; N/A     Comment:  Procedure: CYSTOSCOPY WITH bladder BIOPSY;  Surgeon:               Abbie Sons, MD;  Location: [facility];  Service:               Urology;  Laterality: N/A; 12/28/2019: DIALYSIS/PERMA CATHETER INSERTION; N/A     Comment:  Procedure: DIALYSIS/PERMA CATHETER INSERTION;  Surgeon:               Algernon Huxley, MD;  Location: [place] [facility]  [facility];                Service: Cardiovascular;  Laterality: N/A; 03/18/2021: DIALYSIS/PERMA CATHETER INSERTION; N/A     Comment:  Procedure: DIALYSIS/PERMA CATHETER INSERTION;  Surgeon:               Algernon Huxley, MD;  Location: [place] CV LAB;                Service: Cardiovascular;  Laterality: N/A; 06/02/2020: DIALYSIS/PERMA CATHETER REMOVAL; N/A     Comment:  Procedure: DIALYSIS/PERMA CATHETER REMOVAL;  Surgeon:               Algernon Huxley, MD;  Location: [place] [facility];                Service: Cardiovascular;  Laterality: N/A; 04/10/2020: EXCHANGE OF A DIALYSIS CATHETER; Right     Comment:  Procedure: EXCHANGE OF A DIALYSIS CATHETER;  Surgeon:               Jules Husbands, MD;  Location: [facility];  Service:               General;  Laterality: Right; 01/20/2021: INCISIONAL HERNIA REPAIR     Comment:  Procedure: HERNIA REPAIR INCISIONAL;  Surgeon: Olean Ree, MD;  Location: [facility];  Service: General;; 04/07/2020: IR IMAGE GUIDED DRAINAGE PERCUT CATH  PERITONEAL RETROPERIT 04/16/2021: LAPAROSCOPY; N/A     Comment:  Procedure: LAPAROSCOPY DIAGNOSTIC;  Surgeon: Benjamine Sprague, DO;  Location: [facility];  Service: General;                Laterality: N/A; 12/20/2018: LEFT HEART CATH AND CORONARY ANGIOGRAPHY; Left     Comment:  Procedure: LEFT HEART CATH AND CORONARY ANGIOGRAPHY;                Surgeon: Isaias Cowman, MD;  Location: [place] CV [facility];  Service: Cardiovascular;  Laterality:               Left; 12/27/2018: RADIAL ARTERY HARVEST; Left     Comment:  Procedure: ENDOSCOPIC RADIAL ARTERY HARVEST;  Surgeon:               [name], MD;  Location: [place];  Service: Open               Heart Surgery;  Laterality: Left; 03/20/2021: REMOVAL OF A DIALYSIS CATHETER; Left     Comment:  Procedure: REMOVAL OF A PD CATHETER;  Surgeon: Algernon Huxley, MD;  Location: [facility];  Service: Vascular;                Laterality: Left; 12/27/2018: TEE WITHOUT CARDIOVERSION; N/A     Comment:  Procedure: TRANSESOPHAGEAL ECHOCARDIOGRAM (TEE);                Surgeon: [name], MD;  Location: [place];                 Service: Open Heart Surgery;  Laterality: N/A; 2019: TUMOR REMOVAL     Comment:  Bladder  BMI    Body Mass Index: 22.50 kg/m  Reproductive/Obstetrics negative OB ROS                              Anesthesia Physical Anesthesia Plan  ASA: 4 and emergent  Anesthesia Plan: General   Post-op Pain Management: Ketamine IV* and Ofirmev IV (intra-op)*   Induction: Intravenous and Rapid sequence  PONV Risk Score and Plan: 3 and Ondansetron, Dexamethasone and Midazolam  Airway Management Planned: Oral ETT  Additional Equipment: Arterial line  Intra-op Plan:   Post-operative Plan: Extubation in OR and Possible Post-op intubation/ventilation  Informed Consent: I have reviewed the patients History and Physical, chart, labs and discussed the procedure including the risks, benefits and alternatives for the proposed anesthesia with the patient or authorized representative who has indicated his/her understanding and acceptance.     Dental advisory given  Plan Discussed with: CRNA and Surgeon  Anesthesia Plan Comments: (Discussed risks of anesthesia with patient and his wife at bedside (of note, the wife appeared intoxicated, slurring her words), including PONV, sore throat, lip/dental/eye damage. Rare risks discussed as well, such as cardiorespiratory (aspiration, heart attack) and neurological (stroke) sequelae, and allergic reactions. Discussed the role of CRNA in patient's perioperative care. Patient understands. Patient counseled on being higher risk for anesthesia due to comorbidities: CHF, ESRD. Patient was told about increased risk of cardiac and respiratory events, including death.  Plan for rocuronium RSI, discussed post-induction arterial line placement. Discussed possible post op continued intubation. Discussed his wishes if his heart were to stop, he wishes to be full code.  )         Anesthesia Quick Evaluation

## 2022-04-26 NOTE — Consult Note (Signed)
Pharmacy Antibiotic Note  Corydon Kissner is a 67 y.o. male admitted on 04/26/2022 with intra-abdominal infection/bowel perforation.  Pharmacy has been consulted for Zosyn dosing.  Patient is ESRD on HD. Zosyn 3.375 grams IV x 1 given 04/26/22 @ 1704  Plan: --Start Zosyn 2.25 grams IV every 8 hours    Temp (24hrs), Avg:98.5 F (36.9 C), Min:98.5 F (36.9 C), Max:98.5 F (36.9 C)  Recent Labs  Lab 04/20/22 0807 04/21/22 0255 04/26/22 1518  WBC 8.8 9.4 17.2*  CREATININE 6.37* 5.00* 9.90*    Estimated Creatinine Clearance: 5.7 mL/min (A) (by C-G formula based on SCr of 9.9 mg/dL (H)).    No Known Allergies  Antimicrobials this admission: Zosyn 3/4 >>   Dose adjustments this admission: N/A  Microbiology results: N/A  Thank you for allowing pharmacy to be a part of this patient's care.  Lorin Picket, PharmD 04/26/2022 5:54 PM

## 2022-04-26 NOTE — Transfer of Care (Signed)
Immediate Anesthesia Transfer of Care Note  Patient: Anthony Hess  Procedure(s) Performed: EXPLORATORY LAPAROTOMY WITH REPAIR OF DUODENAL ULCER  Patient Location: PACU  Anesthesia Type:General  Level of Consciousness: drowsy  Airway & Oxygen Therapy: Patient Spontanous Breathing and Patient connected to face mask oxygen  Post-op Assessment: Report given to RN, Post -op Vital signs reviewed and stable, and Patient moving all extremities  Post vital signs: Reviewed and stable  Last Vitals:  Vitals Value Taken Time  BP 133/69 04/26/22 2215  Temp    Pulse 90 04/26/22 2216  Resp 22 04/26/22 2216  SpO2 96 % 04/26/22 2216  Vitals shown include unvalidated device data.  Last Pain:  Vitals:   04/26/22 1943  TempSrc:   PainSc: 6          Complications: No notable events documented.

## 2022-04-26 NOTE — Anesthesia Procedure Notes (Signed)
Procedure Name: Intubation Date/Time: 04/26/2022 8:08 PM  Performed by: Esaw Grandchild, CRNAPre-anesthesia Checklist: Patient identified, Emergency Drugs available, Suction available and Patient being monitored Patient Re-evaluated:Patient Re-evaluated prior to induction Oxygen Delivery Method: Circle system utilized Preoxygenation: Pre-oxygenation with 100% oxygen Induction Type: IV induction and Rapid sequence Laryngoscope Size: Miller and 2 Grade View: Grade II Tube type: Oral Tube size: 7.0 mm Number of attempts: 1 Airway Equipment and Method: Stylet, Oral airway and Bite block Placement Confirmation: ETT inserted through vocal cords under direct vision, positive ETCO2 and breath sounds checked- equal and bilateral Secured at: 21 cm Tube secured with: Tape Dental Injury: Teeth and Oropharynx as per pre-operative assessment

## 2022-04-26 NOTE — ED Provider Notes (Addendum)
[facility] Provider Note    Event Date/Time   First MD Initiated Contact with Patient 04/26/22 1603     (approximate)  History   Chief Complaint: Abdominal Pain  HPI  Anthony Hess is a 67 y.o. male with a past medical history of ESRD on HD Tuesday/Thursday/Saturday, hypertension, hyperlipidemia, presents emergency department for right-sided abdominal pain.  According to the patient since last night he has had a sharp pain in the right mid abdomen.  Denies any nausea vomiting or diarrhea.  Denies any fever cough or congestion.  States slight shortness of breath last received dialysis on Saturday as scheduled for dialysis tomorrow.  Patient denies any urinary symptoms but states he does create a small amount of urine each day.  Physical Exam   Triage Vital Signs: ED Triage Vitals  Enc Vitals Group     BP 04/26/22 1517 (!) 174/118     Pulse Rate 04/26/22 1517 73     Resp 04/26/22 1517 18     Temp 04/26/22 1517 98.5 F (36.9 C)     Temp Source 04/26/22 1517 Oral     SpO2 04/26/22 1517 97 %     Weight --      Height --      Head Circumference --      Peak Flow --      Pain Score 04/26/22 1514 8     Pain Loc --      Pain Edu? --      Excl. in [place]? --     Most recent vital signs: Vitals:   04/26/22 1558 04/26/22 1559  BP: (!) 195/116   Pulse:  71  Resp: (!) 21 (!) 21  Temp:    SpO2:  94%    General: Awake, no distress.  CV:  Good peripheral perfusion.  Regular rate and rhythm  Resp:  Normal effort.  Equal breath sounds bilaterally.  Abd:  No distention.  Soft, moderate right upper mid and lower tenderness to palpation.  No rebound or guarding.  No left-sided tenderness.  ED Results / Procedures / Treatments   RADIOLOGY  I reviewed and interpreted the CT images.  Patient appears to have a mild amount of pneumoperitoneum.  Radiology has called and confirmed pneumoperitoneum suspicion for possible perforated intestine.  EKG viewed and  interpreted by myself shows a normal sinus rhythm at 79 bpm with a narrow QRS, normal axis, normal intervals, no concerning ST changes.  MEDICATIONS ORDERED IN ED: Medications  fentaNYL (SUBLIMAZE) injection 50 mcg (has no administration in time range)     IMPRESSION / MDM / ASSESSMENT AND PLAN / ED COURSE  I reviewed the triage vital signs and the nursing notes.  Patient's presentation is most consistent with acute presentation with potential threat to life or bodily function.  Patient presents emergency department for right-sided abdominal pain since last night.  Patient has moderate tenderness across the entire right side of the abdomen.  Differential would include gallbladder pathology, appendicitis, ureterolithiasis, UTI or pyelonephritis, colitis or diverticulitis.  We will check labs including CBC chemistry and a lipase.  Will obtain CT imaging without contrast as the patient does make a small amount of urine each day.  Will also obtain a portable chest x-ray to evaluate for any pulmonary edema while awaiting results.  Patient agreeable to plan of care.  We will treat pain with fentanyl.  Patient's labs have resulted showing a moderate leukocytosis of 17,000 otherwise reassuring CBC, chemistry shows elevated  creatinine however the patient is a dialysis patient potassium elevated to 5.9.  Lipase of 38.    CT has resulted showing pneumoperitoneum concerning for bowel perforation.  We will start the patient on IV antibiotics.  Patient is mildly hyperkalemic on his lab work we will gently hydrate and discussed with nephrology given likelihood of needing emergent operation.  Currently awaiting to speak to general surgery.  I spoke with Dr. Peyton Najjar of general surgery.  I start the patient on IV Zosyn and he would wish for the patient to be admitted to the hospital service.  I have paged the hospitalist service.  I spoke to Dr. Juleen China of nephrology we will dose calcium gluconate, start the  patient on sodium bicarbonate infusion in anticipation for likely emergent OR.  After OR nephrology will arrange for dialysis.   CRITICAL CARE Performed by: Harvest Dark   Total critical care time: 45 minutes  Critical care time was exclusive of separately billable procedures and treating other patients.  Critical care was necessary to treat or prevent imminent or life-threatening deterioration.  Critical care was time spent personally by me on the following activities: development of treatment plan with patient and/or surrogate as well as nursing, discussions with consultants, evaluation of patient's response to treatment, examination of patient, obtaining history from patient or surrogate, ordering and performing treatments and interventions, ordering and review of laboratory studies, ordering and review of radiographic studies, pulse oximetry and re-evaluation of patient's condition.   FINAL CLINICAL IMPRESSION(S) / ED DIAGNOSES   Pneumoperitoneum Hyperkalemia  Note:  This document was prepared using Dragon voice recognition software and may include unintentional dictation errors.       Harvest Dark, MD 04/26/22 1739

## 2022-04-26 NOTE — ED Notes (Signed)
Pt's wife currently at bedside. She is currently requesting to speak with pt's doctor. Dwyane Dee MD notified via secure chat.

## 2022-04-27 ENCOUNTER — Other Ambulatory Visit: Payer: Self-pay

## 2022-04-27 ENCOUNTER — Encounter: Payer: Self-pay | Admitting: Student

## 2022-04-27 DIAGNOSIS — K631 Perforation of intestine (nontraumatic): Secondary | ICD-10-CM | POA: Diagnosis not present

## 2022-04-27 LAB — BASIC METABOLIC PANEL
Anion gap: 15 (ref 5–15)
BUN: 84 mg/dL — ABNORMAL HIGH (ref 8–23)
CO2: 24 mmol/L (ref 22–32)
Calcium: 7.4 mg/dL — ABNORMAL LOW (ref 8.9–10.3)
Chloride: 94 mmol/L — ABNORMAL LOW (ref 98–111)
Creatinine, Ser: 10.42 mg/dL — ABNORMAL HIGH (ref 0.61–1.24)
GFR, Estimated: 5 mL/min — ABNORMAL LOW (ref >60)
Glucose, Bld: 181 mg/dL — ABNORMAL HIGH (ref 70–99)
Potassium: 5.7 mmol/L — ABNORMAL HIGH (ref 3.5–5.1)
Sodium: 133 mmol/L — ABNORMAL LOW (ref 135–145)

## 2022-04-27 LAB — CBC
HCT: 36.3 % — ABNORMAL LOW (ref 39.0–52.0)
Hemoglobin: 11.5 g/dL — ABNORMAL LOW (ref 13.0–17.0)
MCH: 29.2 pg (ref 26.0–34.0)
MCHC: 31.7 g/dL (ref 30.0–36.0)
MCV: 92.1 fL (ref 80.0–100.0)
Platelets: 283 10*3/uL (ref 150–400)
RBC: 3.94 MIL/uL — ABNORMAL LOW (ref 4.22–5.81)
RDW: 17.2 % — ABNORMAL HIGH (ref 11.5–15.5)
WBC: 22.9 10*3/uL — ABNORMAL HIGH (ref 4.0–10.5)
nRBC: 0 % (ref 0.0–0.2)

## 2022-04-27 LAB — HEPARIN LEVEL (UNFRACTIONATED): Heparin Unfractionated: <0.1 IU/mL — ABNORMAL LOW (ref 0.30–0.70)

## 2022-04-27 LAB — MAGNESIUM: Magnesium: 2 mg/dL (ref 1.7–2.4)

## 2022-04-27 LAB — PHOSPHORUS: Phosphorus: 10.1 mg/dL — ABNORMAL HIGH (ref 2.5–4.6)

## 2022-04-27 LAB — HEPATITIS B SURFACE ANTIGEN: Hepatitis B Surface Ag: NONREACTIVE

## 2022-04-27 LAB — APTT: aPTT: 32 seconds (ref 24–36)

## 2022-04-27 MED ORDER — PANTOPRAZOLE SODIUM 40 MG IV SOLR: 40.0000 mg | Freq: Two times a day (BID) | INTRAVENOUS | Status: DC

## 2022-04-27 MED ORDER — HEPARIN (PORCINE) 25000 UT/250ML-% IV SOLN
1550.0000 [IU]/h | INTRAVENOUS | Status: DC
  Administered 2022-04-27: 800 [IU]/h via INTRAVENOUS
  Administered 2022-04-28: 1300 [IU]/h via INTRAVENOUS
  Administered 2022-04-29 – 2022-04-30 (×2): 1550 [IU]/h via INTRAVENOUS
  Filled 2022-04-27 (×4): qty 250

## 2022-04-27 MED ORDER — PANTOPRAZOLE SODIUM 40 MG IV SOLR
40.0000 mg | Freq: Two times a day (BID) | INTRAVENOUS | Status: DC
  Administered 2022-04-27 – 2022-05-01 (×8): 40 mg via INTRAVENOUS
  Filled 2022-04-27 (×8): qty 10

## 2022-04-27 MED ORDER — METOPROLOL TARTRATE 5 MG/5ML IV SOLN: 5.0000 mg | Freq: Four times a day (QID) | INTRAVENOUS | Status: DC | PRN

## 2022-04-27 NOTE — Progress Notes (Signed)
[facility]  ROUNDING NOTE   Subjective:   Anthony Hess is a 67 year old male with past medical conditions including hypertension, PVD, dyslipidemia, CAD, four-vessel CABG, and end-stage renal disease on hemodialysis.  Patient presents to the emergency department with complaints of abdominal painand has been admitted for Pneumoperitoneum [K66.8] Hyperkalemia [E87.5] Perforation bowel ([place]) [K63.1] Abdominal pain, unspecified abdominal location [R10.9]  Patient is known to our practice from previous admissions and currently receives outpatient dialysis treatments at [facility] on a TTS schedule, supervised by Dr. Holley Raring.  Patient states he has been having abdominal pain for few days.  Currently seen and evaluated during dialysis   HEMODIALYSIS FLOWSHEET:  Blood Flow Rate (mL/min): 400 mL/min Arterial Pressure (mmHg): -250 mmHg Venous Pressure (mmHg): 150 mmHg TMP (mmHg): 4 mmHg Ultrafiltration Rate (mL/min): 686 mL/min Dialysate Flow Rate (mL/min): 300 ml/min  Denies nausea or vomiting at onset.  Currently denies any pain or discomfort.  Reports soreness with movement.  No lower extremity edema.  Remains on 4 L nasal cannula postoperatively.  A.m. labs significant for sodium 133, potassium 5.7, glucose 181, creatinine 10.42 with GFR 5, and hemoglobin 11.5.  Respiratory panel negative for influenza, COVID-19, and RSV.  Chest x-ray on ED arrival negative for pleural effusion or pneumothorax.  It faintly shows a possible right hemidiaphragm a faint lucencies.  CT abdomen pelvis shows pneumoperitoneum and small ascites suggestive of bowel perforation.  Surgery consulted.  We have been consulted to manage dialysis needs during this admission.   Objective:  Vital signs in last 24 hours:  Temp:  [97.4 F (36.3 C)-98.6 F (37 C)] 97.9 F (36.6 C) (03/05 0841) Pulse Rate:  [65-99] 73 (03/05 1030) Resp:  [13-30] 14 (03/05 1030) BP: (109-206)/(62-132) 128/102 (03/05  1030) SpO2:  [89 %-100 %] 96 % (03/05 1030) Arterial Line BP: (133-139)/(69-82) 133/69 (03/04 2215) Weight:  [56.2 kg] 56.2 kg (03/05 0841)  Weight change:  Filed Weights   04/27/22 0841  Weight: 56.2 kg    Intake/Output: I/O last 3 completed shifts: In: 925 [I.V.:800; IV Piggyback:125] Out: 80 [Urine:20; Drains:30; Blood:30]   Intake/Output this shift:  No intake/output data recorded.  Physical Exam: General: Ill appearance  Head: Normocephalic, atraumatic. Moist oral mucosal membranes  Eyes: Anicteric  Lungs:  Clear to auscultation, normal effort  Heart: Regular rate and rhythm  Abdomen:  Soft, tender, mild distention  Extremities: No peripheral edema.  Neurologic: Somnolent, moving all four extremities  Skin: No lesions  Access: Right PermCath    Basic Metabolic Panel: Recent Labs  Lab 04/21/22 0255 04/26/22 1518 04/26/22 1932 04/27/22 0535  NA 139 138  --  133*  K 4.2 5.9* 5.0 5.7*  CL 103 102  --  94*  CO2 24 17*  --  24  GLUCOSE 103* 98  --  181*  BUN 44* 77*  --  84*  CREATININE 5.00* 9.90*  --  10.42*  CALCIUM 7.3* 7.4*  --  7.4*  MG  --   --   --  2.0  PHOS  --   --   --  10.1*     Liver Function Tests: Recent Labs  Lab 04/26/22 1518  AST 15  ALT 15  ALKPHOS 52  BILITOT 0.5  PROT 7.2  ALBUMIN 3.8    Recent Labs  Lab 04/26/22 1518  LIPASE 38    No results for input(s): "AMMONIA" in the last 168 hours.  CBC: Recent Labs  Lab 04/21/22 0255 04/26/22 1518 04/27/22 0535  WBC 9.4 17.2* 22.9*  HGB 12.1* 12.4* 11.5*  HCT 39.4 40.9 36.3*  MCV 93.1 95.6 92.1  PLT 285 323 283     Cardiac Enzymes: No results for input(s): "CKTOTAL", "CKMB", "CKMBINDEX", "TROPONINI" in the last 168 hours.  BNP: Invalid input(s): "POCBNP"  CBG: No results for input(s): "GLUCAP" in the last 168 hours.  Microbiology: Results for orders placed or performed during the hospital encounter of 04/26/22  Resp panel by RT-PCR (RSV, Flu A&B, Covid)  Anterior Nasal Swab     Status: None   Collection Time: 04/26/22  6:43 PM   Specimen: Anterior Nasal Swab  Result Value Ref Range Status   SARS Coronavirus 2 by RT PCR NEGATIVE NEGATIVE Final    Comment: (NOTE) SARS-CoV-2 target nucleic acids are NOT DETECTED.  The SARS-CoV-2 RNA is generally detectable in upper respiratory specimens during the acute phase of infection. The lowest concentration of SARS-CoV-2 viral copies this assay can detect is 138 copies/mL. A negative result does not preclude SARS-Cov-2 infection and should not be used as the sole basis for treatment or other patient management decisions. A negative result may occur with  improper specimen collection/handling, submission of specimen other than nasopharyngeal swab, presence of viral mutation(s) within the areas targeted by this assay, and inadequate number of viral copies(<138 copies/mL). A negative result must be combined with clinical observations, patient history, and epidemiological information. The expected result is Negative.  Fact Sheet for Patients:  EntrepreneurPulse.com.au  Fact Sheet for Healthcare Providers:  IncredibleEmployment.be  This test is no t yet approved or cleared by the [place] FDA and  has been authorized for detection and/or diagnosis of SARS-CoV-2 by FDA under an Emergency Use Authorization (EUA). This EUA will remain  in effect (meaning this test can be used) for the duration of the COVID-19 declaration under Section 564(b)(1) of the Act, 21 U.S.C.section 360bbb-3(b)(1), unless the authorization is terminated  or revoked sooner.       Influenza A by PCR NEGATIVE NEGATIVE Final   Influenza B by PCR NEGATIVE NEGATIVE Final    Comment: (NOTE) The Xpert Xpress SARS-CoV-2/FLU/RSV plus assay is intended as an aid in the diagnosis of influenza from Nasopharyngeal swab specimens and should not be used as a sole basis for treatment. Nasal washings  and aspirates are unacceptable for Xpert Xpress SARS-CoV-2/FLU/RSV testing.  Fact Sheet for Patients: EntrepreneurPulse.com.au  Fact Sheet for Healthcare Providers: IncredibleEmployment.be  This test is not yet approved or cleared by the [place] FDA and has been authorized for detection and/or diagnosis of SARS-CoV-2 by FDA under an Emergency Use Authorization (EUA). This EUA will remain in effect (meaning this test can be used) for the duration of the COVID-19 declaration under Section 564(b)(1) of the Act, 21 U.S.C. section 360bbb-3(b)(1), unless the authorization is terminated or revoked.     Resp Syncytial Virus by PCR NEGATIVE NEGATIVE Final    Comment: (NOTE) Fact Sheet for Patients: EntrepreneurPulse.com.au  Fact Sheet for Healthcare Providers: IncredibleEmployment.be  This test is not yet approved or cleared by the [place] FDA and has been authorized for detection and/or diagnosis of SARS-CoV-2 by FDA under an Emergency Use Authorization (EUA). This EUA will remain in effect (meaning this test can be used) for the duration of the COVID-19 declaration under Section 564(b)(1) of the Act, 21 U.S.C. section 360bbb-3(b)(1), unless the authorization is terminated or revoked.  Performed at [facility], [address] [place], [place] [address]     Coagulation Studies: No  results for input(s): "LABPROT", "INR" in the last 72 hours.   Urinalysis: No results for input(s): "COLORURINE", "LABSPEC", "PHURINE", "GLUCOSEU", "HGBUR", "BILIRUBINUR", "KETONESUR", "PROTEINUR", "UROBILINOGEN", "NITRITE", "LEUKOCYTESUR" in the last 72 hours.  Invalid input(s): "APPERANCEUR"    Imaging: CT ABDOMEN PELVIS WO CONTRAST  Result Date: 04/26/2022 CLINICAL DATA:  Right lower quadrant abdominal pain. EXAM: CT ABDOMEN AND PELVIS WITHOUT CONTRAST TECHNIQUE: Multidetector CT imaging of the abdomen  and pelvis was performed following the standard protocol without IV contrast. RADIATION DOSE REDUCTION: This exam was performed according to the departmental dose-optimization program which includes automated exposure control, adjustment of the mA and/or kV according to patient size and/or use of iterative reconstruction technique. COMPARISON:  CT abdomen pelvis dated 04/19/2022. FINDINGS: Evaluation of this exam is limited in the absence of intravenous contrast. Evaluation is also limited due to respiratory motion and paucity of intra-abdominal fat. Lower chest: The visualized lung bases are clear. There is pneumoperitoneum and small ascites. Hepatobiliary: A 2 cm hypodense lesion in the dome of the liver not characterized on this CT but was present on the prior CT. Small hypodense lesion in the left lobe of the liver also present on the prior CT. A small cyst noted in the right lobe 1 of the liver inferiorly. No dilatation. There is layering sludge and small stones within the gallbladder. No pericholecystic fluid. Pancreas: The pancreas is grossly unremarkable as visualized. Spleen: Normal in size without focal abnormality. Adrenals/Urinary Tract: The adrenal glands are unremarkable. Moderate bilateral renal parenchyma atrophy. There is no hydronephrosis or nephrolithiasis on either side. The urinary bladder is minimally distended and grossly unremarkable. Stomach/Bowel: There is no bowel obstruction. The appendix is unremarkable as visualized. Vascular/Lymphatic: Advanced aortoiliac atherosclerotic disease. The aorta is tortuous. The IVC is grossly unremarkable. No obvious adenopathy. Reproductive: Mildly enlarged prostate gland measuring 4.5 cm in transverse axial diameter. Other: None Musculoskeletal: Degenerative changes of the spine and scoliosis. No acute osseous pathology. IMPRESSION: 1. Pneumoperitoneum and small ascites. Findings are concerning for bowel perforation. Surgical consult is advised. 2.  Cholelithiasis. 3. No hydronephrosis or nephrolithiasis. 4. No bowel obstruction. Normal appendix. 5.  Aortic Atherosclerosis (ICD10-I70.0). These results were called by telephone at the time of interpretation on 04/26/2022 at 4:49 pm to provider [facility] , who verbally acknowledged these results. Electronically Signed   By: Anner Crete M.D.   On: 04/26/2022 16:51   DG Chest Portable 1 View  Result Date: 04/26/2022 CLINICAL DATA:  Shortness of breath, abdominal pain EXAM: PORTABLE CHEST 1 VIEW COMPARISON:  Previous studies including the examination of 04/19/2022 FINDINGS: Transverse diameter of heart is slightly increased. Central pulmonary vessels are slightly prominent. Thoracic aorta is tortuous and ectatic. Tip of dialysis catheter is seen at the junction of superior vena cava and right atrium. There is previous coronary bypass surgery. Faint lucency is seen in the right hemidiaphragm. Patient is scheduled for CT abdomen and pelvis. Dextroscoliosis is seen in thoracic spine. IMPRESSION: There are no signs of pulmonary edema or focal pulmonary consolidation. There is no pleural effusion or pneumothorax. Faint lucencies are seen under the right hemidiaphragm suggesting possible pneumoperitoneum. Electronically Signed   By: Elmer Picker M.D.   On: 04/26/2022 16:43     Medications:    sodium chloride     anticoagulant sodium citrate     piperacillin-tazobactam (ZOSYN)  IV 2.25 g (04/27/22 0628)   sodium bicarbonate 150 mEq in sterile water 1,150 mL infusion 150 mL/hr at 04/27/22 0232     atorvastatin  80 mg Oral QHS   calcitRIOL  0.5 mcg Oral Daily   Chlorhexidine Gluconate Cloth  6 each Topical Q0600   hydrALAZINE  25 mg Oral TID   isosorbide dinitrate  10 mg Oral TID   pantoprazole (PROTONIX) IV  40 mg Intravenous Q12H   sodium chloride flush  3 mL Intravenous Q12H   sodium chloride flush  3 mL Intravenous Q12H   sodium chloride, acetaminophen **OR** acetaminophen,  alteplase, anticoagulant sodium citrate, heparin, hydrALAZINE, HYDROmorphone (DILAUDID) injection, lidocaine (PF), lidocaine-prilocaine, metoprolol tartrate, ondansetron **OR** ondansetron (ZOFRAN) IV, pentafluoroprop-tetrafluoroeth, sodium chloride flush  Assessment/ Plan:  Anthony Hess is a 67 y.o.  male  Anthony Hess is a 67 year old male with past medical conditions including hypertension, PVD, dyslipidemia, CAD, four-vessel CABG, and end-stage renal disease on hemodialysis.  Patient presents to the emergency department with complaints of shortness of breath and has been admitted for Pneumoperitoneum [K66.8] Hyperkalemia [E87.5] Perforation bowel ([place]) [K63.1] Abdominal pain, unspecified abdominal location [R10.9]  [facility]/TTS/right PermCath.  End-stage renal disease with hyperkalemia on hemodialysis.    Receiving dialysis today, per outpatient schedule. UF 1.5L as tolerated. Next treatment scheduled for Thursday.   2. Anemia of chronic kidney disease Lab Results  Component Value Date   HGB 11.5 (L) 04/27/2022    Hemoglobin within desired range for renal patient. Patient receives Mircera at outpatient clinic  3. Secondary Hyperparathyroidism: with outpatient labs: PTH 532, phosphorus 10.7, calcium 6.1 on 02/11/22.  :  Lab Results  Component Value Date   PTH 159 (H) 12/27/2019   CALCIUM 7.4 (L) 04/27/2022   CAION 0.84 (LL) 09/11/2021   PHOS 10.1 (H) 04/27/2022    Hypocalcemia/hyperphosphatemia-continue daily calcitriol.  Prescribed calcium acetate and ergocalciferol outpatient.  4.  Hypertension with chronic kidney disease.  Home regimen includes amlodipine, clonidine, metoprolol, Entresto and losartan.  Currently receiving hydralazine, isosorbide, metoprolol, and Entresto.  Blood pressure 123/95 during dialysis  5.  Bowel perforation, surgery consulted and performed bowel repair with Phillip Heal patch on 04/26/2022.   LOS: 1 Anthony Hess 3/5/202410:57  AM

## 2022-04-27 NOTE — Progress Notes (Signed)
[facility] Progress Note  Patient: Anthony Hess    [id number]  DOA: 04/26/2022     Date of Service: the patient was seen and examined on 04/27/2022  Chief Complaint  Patient presents with   Abdominal Pain   Brief hospital course: Hariharan Kloos is a 67 y.o. male with PMH of HTN, HLD, CAD s/p CABG,  chronic systolic CHF, paroxysmal A-fib and PE (04/19/22) on DOAC, PVD, ESRD on HD TTS schedule, infrarenal abdominal aortic aneurysm without rupture, presented at [facility] ED with complaining of abdominal pain.  As per patient he was having abdominal pain for past few days, last night pain became worse and very sharp in the right side of abdomen.  Patient denied any nausea vomiting.   ED Course: Hypertensive, BP 206/132 highest,  Sodium 133, potassium 5.9 hyperkalemia, bicarb 17, metabolic acidosis, blood glucose 98, BUN 77 creatinine 9.9, calcium 7.4  CBC WBC 17.2, Hb 12.4.  COVID and flu negative CT A/P: Pneumoperitoneum and small ascites. Findings are concerning for bowel perforation. Surgical consult is advised.   General surgery and nephrology was consulted by ED physician, [facility] hospitalist team was consulted for admission and further management.   Assessment and Plan:  # Perforated duodenal ulcer  Patient presented with sudden onset of abdominal pain, CT scan showed pneumoperitoneum  Started IV Zosyn, pharmacy was consulted. Started pantoprazole 40 mg IV twice daily Continue as needed medication for pain control General surgery consulted, s/p duodenal perforation repair done on 3/4 Keep n.p.o. Plan for upper GI series tomorrow a.m. if no leakage and H&H remained stable then patient will be started on clear liquid diet     # Hyperkalemia due to ESRD Patient was given IV calcium in the ED Insulin with dextrose ordered Monitor potassium level     # ESRD on hemodialysis TTS schedule Metabolic acidosis due to ESRD, s/p bicarb IV infusion  Nephrology was consulted     # CAD s/p  CABG, paroxysmal A-fib on DOAC, HTN, HLD, PVD, infrarenal abdominal aortic aneurysm without rupture Uncontrolled blood pressure could be due to pain and missed morning medications. Resumed statin, hydralazine and Imdur home dose Use IV Lopressor and IV hydralazine as needed We will continue monitor BP and titrate medication accordingly   # History of pulmonary embolism Patient was on Eliquis which was held due to surgery 3/5 started heparin IV infusion without bolus We will follow general surgery to start [place] when safe to do so   Body mass index is 21.17 kg/m.  Interventions:       Diet: NPO DVT Prophylaxis: Therapeutic Anticoagulation with heparin IV infusion    Advance goals of care discussion: Full code  Family Communication: family was not present at bedside, at the time of interview.  The pt provided permission to discuss medical plan with the family. Opportunity was given to ask question and all questions were answered satisfactorily.   Disposition:  Pt is from Home, admitted with duodenal ulcer perforation, still NPO on IV Abx, which precludes a safe discharge. Discharge to Home, when stable, may need few days more to improve.  Subjective: No significant events overnight, patient tolerated surgical procedure well, still feels abdominal soreness, has not passed any BM or gas.  Denies any chest pain or palpitation, no shortness of breath.   Physical Exam: General: NAD, lying comfortably Appear in no distress, affect appropriate Eyes: PERRLA ENT: Oral Mucosa Clear, moist  Neck: no JVD,  Cardiovascular: S1 and S2 Present, no Murmur,  Respiratory: good  respiratory effort, Bilateral Air entry equal and Decreased, no Crackles, no wheezes Abdomen: Bowel Sound sluggish, Soft and mild generalized tenderness, dressing CDI Skin: no rashes Extremities: no Pedal edema, no calf tenderness Neurologic: without any new focal findings Gait not checked due to patient safety  concerns  Vitals:   04/27/22 1200 04/27/22 1224 04/27/22 1230 04/27/22 1238  BP: 117/87 117/87 123/80   Pulse: 79 77 80   Resp: '16 18 16   '$ Temp:   98 F (36.7 C)   TempSrc:   Oral   SpO2: 98% 100% 99%   Weight:    54.2 kg    Intake/Output Summary (Last 24 hours) at 04/27/2022 1458 Last data filed at 04/27/2022 1230 Gross per 24 hour  Intake 925 ml  Output 1580 ml  Net -655 ml   Filed Weights   04/27/22 0841 04/27/22 1238  Weight: 56.2 kg 54.2 kg    Data Reviewed: I have personally reviewed and interpreted daily labs, tele strips, imagings as discussed above. I reviewed all nursing notes, pharmacy notes, vitals, pertinent old records I have discussed plan of care as described above with RN and patient/family.  CBC: Recent Labs  Lab 04/21/22 0255 04/26/22 1518 04/27/22 0535  WBC 9.4 17.2* 22.9*  HGB 12.1* 12.4* 11.5*  HCT 39.4 40.9 36.3*  MCV 93.1 95.6 92.1  PLT 285 323 [id number]   Basic Metabolic Panel: Recent Labs  Lab 04/21/22 0255 04/26/22 1518 04/26/22 1932 04/27/22 0535  NA 139 138  --  133*  K 4.2 5.9* 5.0 5.7*  CL 103 102  --  94*  CO2 24 17*  --  24  GLUCOSE 103* 98  --  181*  BUN 44* 77*  --  84*  CREATININE 5.00* 9.90*  --  10.42*  CALCIUM 7.3* 7.4*  --  7.4*  MG  --   --   --  2.0  PHOS  --   --   --  10.1*    Studies: CT ABDOMEN PELVIS WO CONTRAST  Result Date: 04/26/2022 CLINICAL DATA:  Right lower quadrant abdominal pain. EXAM: CT ABDOMEN AND PELVIS WITHOUT CONTRAST TECHNIQUE: Multidetector CT imaging of the abdomen and pelvis was performed following the standard protocol without IV contrast. RADIATION DOSE REDUCTION: This exam was performed according to the departmental dose-optimization program which includes automated exposure control, adjustment of the mA and/or kV according to patient size and/or use of iterative reconstruction technique. COMPARISON:  CT abdomen pelvis dated 04/19/2022. FINDINGS: Evaluation of this exam is limited in the absence  of intravenous contrast. Evaluation is also limited due to respiratory motion and paucity of intra-abdominal fat. Lower chest: The visualized lung bases are clear. There is pneumoperitoneum and small ascites. Hepatobiliary: A 2 cm hypodense lesion in the dome of the liver not characterized on this CT but was present on the prior CT. Small hypodense lesion in the left lobe of the liver also present on the prior CT. A small cyst noted in the right lobe 1 of the liver inferiorly. No dilatation. There is layering sludge and small stones within the gallbladder. No pericholecystic fluid. Pancreas: The pancreas is grossly unremarkable as visualized. Spleen: Normal in size without focal abnormality. Adrenals/Urinary Tract: The adrenal glands are unremarkable. Moderate bilateral renal parenchyma atrophy. There is no hydronephrosis or nephrolithiasis on either side. The urinary bladder is minimally distended and grossly unremarkable. Stomach/Bowel: There is no bowel obstruction. The appendix is unremarkable as visualized. Vascular/Lymphatic: Advanced aortoiliac atherosclerotic disease. The aorta is tortuous.  The IVC is grossly unremarkable. No obvious adenopathy. Reproductive: Mildly enlarged prostate gland measuring 4.5 cm in transverse axial diameter. Other: None Musculoskeletal: Degenerative changes of the spine and scoliosis. No acute osseous pathology. IMPRESSION: 1. Pneumoperitoneum and small ascites. Findings are concerning for bowel perforation. Surgical consult is advised. 2. Cholelithiasis. 3. No hydronephrosis or nephrolithiasis. 4. No bowel obstruction. Normal appendix. 5.  Aortic Atherosclerosis (ICD10-I70.0). These results were called by telephone at the time of interpretation on 04/26/2022 at 4:49 pm to provider [facility] , who verbally acknowledged these results. Electronically Signed   By: Anner Crete M.D.   On: 04/26/2022 16:51   DG Chest Portable 1 View  Result Date: 04/26/2022 CLINICAL DATA:   Shortness of breath, abdominal pain EXAM: PORTABLE CHEST 1 VIEW COMPARISON:  Previous studies including the examination of 04/19/2022 FINDINGS: Transverse diameter of heart is slightly increased. Central pulmonary vessels are slightly prominent. Thoracic aorta is tortuous and ectatic. Tip of dialysis catheter is seen at the junction of superior vena cava and right atrium. There is previous coronary bypass surgery. Faint lucency is seen in the right hemidiaphragm. Patient is scheduled for CT abdomen and pelvis. Dextroscoliosis is seen in thoracic spine. IMPRESSION: There are no signs of pulmonary edema or focal pulmonary consolidation. There is no pleural effusion or pneumothorax. Faint lucencies are seen under the right hemidiaphragm suggesting possible pneumoperitoneum. Electronically Signed   By: Elmer Picker M.D.   On: 04/26/2022 16:43    Scheduled Meds:  atorvastatin  80 mg Oral QHS   calcitRIOL  0.5 mcg Oral Daily   Chlorhexidine Gluconate Cloth  6 each Topical Q0600   hydrALAZINE  25 mg Oral TID   isosorbide dinitrate  10 mg Oral TID   pantoprazole (PROTONIX) IV  40 mg Intravenous Q12H   sodium chloride flush  3 mL Intravenous Q12H   sodium chloride flush  3 mL Intravenous Q12H   Continuous Infusions:  sodium chloride     heparin     piperacillin-tazobactam (ZOSYN)  IV 2.25 g (04/27/22 1446)   PRN Meds: sodium chloride, acetaminophen **OR** acetaminophen, hydrALAZINE, HYDROmorphone (DILAUDID) injection, metoprolol tartrate, ondansetron **OR** ondansetron (ZOFRAN) IV, sodium chloride flush  Time spent: 35 minutes  Author: Val Riles. MD [facility] 04/27/2022 2:58 PM  To reach On-call, see care teams to locate the attending and reach out to them via [email or web address]. If 7PM-7AM, please contact night-coverage If you still have difficulty reaching the attending provider, please page the [facility] (Director on Call) for [facility] on amion for assistance.

## 2022-04-27 NOTE — Progress Notes (Signed)
Received patient in bed to unit.  Alert and oriented.  Informed consent signed and in chart.   TX duration: 3.5 hrs  Patient tolerated well.  Transported back to the room  Alert, without acute distress.  Hand-off given to patient's nurse.   Access used: Right chest HD catheter. Access issues: NONE  Total UF removed: 1.5L Medication(s) given: NONE    [facility]

## 2022-04-27 NOTE — Discharge Planning (Signed)
[facility] [address], [place] [address] [phone]  Scheduled days: Tuesday Thursday and Saturday  Treatment time: 6:00am

## 2022-04-27 NOTE — Progress Notes (Signed)
Patient ID: Anthony Hess, male   DOB: 02/21/1956, 67 y.o.   MRN: [id number]     [place] [facility] Day(s): 1.   Interval History: Patient seen and examined, no acute events or new complaints overnight. Patient reports feeling sore but otherwise feeling well.  Denies any nausea or vomiting.  Denies worsening abdominal pain.  Endorses pain controlled with current pain medications.  Vital signs in last 24 hours: [min-max] current  Temp:  [97.4 F (36.3 C)-98.6 F (37 C)] 98 F (36.7 C) (03/05 1230) Pulse Rate:  [65-99] 80 (03/05 1230) Resp:  [13-30] 16 (03/05 1230) BP: (109-206)/(62-132) 123/80 (03/05 1230) SpO2:  [89 %-100 %] 99 % (03/05 1230) Arterial Line BP: (133-139)/(69-82) 133/69 (03/04 2215) Weight:  [54.2 kg-56.2 kg] 54.2 kg (03/05 1238)       Weight: 54.2 kg BMI (Calculated): 21.17   Physical Exam:  Constitutional: alert, cooperative and no distress  Respiratory: breathing non-labored at rest  Cardiovascular: regular rate and sinus rhythm  Gastrointestinal: soft, non-tender, and non-distended  Labs:     Latest Ref Rng & Units 04/27/2022    5:35 AM 04/26/2022    3:18 PM 04/21/2022    2:55 AM  CBC  WBC 4.0 - 10.5 K/uL 22.9  17.2  9.4   Hemoglobin 13.0 - 17.0 g/dL 11.5  12.4  12.1   Hematocrit 39.0 - 52.0 % 36.3  40.9  39.4   Platelets 150 - 400 K/uL 283  323  285       Latest Ref Rng & Units 04/27/2022    5:35 AM 04/26/2022    7:32 PM 04/26/2022    3:18 PM  CMP  Glucose 70 - 99 mg/dL 181   98   BUN 8 - 23 mg/dL 84   77   Creatinine 0.61 - 1.24 mg/dL 10.42   9.90   Sodium 135 - 145 mmol/L 133   138   Potassium 3.5 - 5.1 mmol/L 5.7  5.0  5.9   Chloride 98 - 111 mmol/L 94   102   CO2 22 - 32 mmol/L 24   17   Calcium 8.9 - 10.3 mg/dL 7.4   7.4   Total Protein 6.5 - 8.1 g/dL   7.2   Total Bilirubin 0.3 - 1.2 mg/dL   0.5   Alkaline Phos 38 - 126 U/L   52   AST 15 - 41 U/L   15   ALT 0 - 44 U/L   15     Imaging studies: No new pertinent imaging  studies   Assessment/Plan:  67 y.o. male with perforated duodenal ulcer 1 Day Post-Op s/p duodenal ulcer repair, complicated by pertinent comorbidities including recent non-STEMI, recent PE on Eliquis, end-stage renal disease on hemodialysis.  -Today with stable vital signs -Physical exam without acute abdomen, no abdominal distention.  JP with serosanguineous output.  No bilious output. -From surgical standpoint patient can restart anticoagulation on heparin drip if needed.  Once we see that hemoglobin continues to be stable then it can be resending her longer acting anticoagulation. -Encouraged the patient to ambulate -Will continue NPO with NGT to suction.  Plan is to order upper GI series tomorrow for evaluation of healing of the duodenal ulcer repair.  If upper GI bleeding is negative will consider starting weaker liquid diet. -Continue medical management of complicated medical comorbidities.  Arnold Long, MD

## 2022-04-27 NOTE — Consult Note (Addendum)
ANTICOAGULATION CONSULT NOTE - Initial Consult  Pharmacy Consult for heparin infusion Indication: pulmonary embolus  (New PE  started on Eliquis on 2/28, reversal with Kcentra and Vitamin K  n 3/4 for emergency surgery)  No Known Allergies  Patient Measurements: Weight: 54.2 kg (119 lb 7.8 oz) Heparin Dosing Weight: 54.2 kg  Vital Signs: Temp: 98 F (36.7 C) (03/05 1230) Temp Source: Oral (03/05 1230) BP: 123/80 (03/05 1230) Pulse Rate: 80 (03/05 1230)  Labs: Recent Labs    04/26/22 1518 04/27/22 0535 04/27/22 1424  HGB 12.4* 11.5*  --   HCT 40.9 36.3*  --   PLT 323 283  --   APTT  --   --  32  HEPARINUNFRC  --   --  <0.10*  CREATININE 9.90* 10.42*  --    Estimated Creatinine Clearance: 5.5 mL/min (A) (by C-G formula based on SCr of 10.42 mg/dL (H)).  Medical History: Past Medical History:  Diagnosis Date   Aortic atherosclerosis ([place])    Bilateral carotid artery disease ([place])    Bladder cancer ([place])    Coronary artery disease 12/20/2018   a.) LHC 12/20/2018: 50% OM1, 40% OM2, 95% o-pLAD, 75% o=pLCx, 40% mLM, 70% D1, 60% mRCA-1, 50% mRCA-2; refer to CVTS. b.) 4v CABG at [facility] on 12/27/2018: LIMA-LAD, RIMA-PDA, seg LRA-OM1-D1   DCM (dilated cardiomyopathy) ([place]) 12/05/2018   a.) TTE 12/05/2018: EF 40-45%. b.) TTE 12/28/2019: EF 20-25%.   ESRD (end stage renal disease) ([place])    a.) T-Th-Sat   HFrEF (heart failure with reduced ejection fraction) ([place]) 12/05/2018   a.) TTE 12/05/2018: EF 40-45%; mild LVH; ant/apical/sep HK; mild TR . b.) TTE 12/28/2019: EF 20-25%; mod LVH; mod MR/AR; G1DD.   History of 2019 novel coronavirus disease (COVID-19) 04/08/2021   History of kidney stones    HLD (hyperlipidemia)    Hypertension    Infrarenal abdominal aortic aneurysm (AAA) without rupture ([place]) 03/05/2021   a.) CT abd/pelvis; measured 3.2 cm.   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 PMH includes CAD s/p CABG x 4 (2020), HFrEF (20-25%, g1DD, mod MR, mi-mod AR 2021), ESRD (T,TH,S HD), PAD, hx tobacco. Most recent acute care admission was 2/28 for excessive bleeding. New dx of PE during admission and discharged on Eliquis. Reversal with Kcentra and Vitamin K on 3/4 at prior to emergency repair surgery.   Pharmacy consulted to resume AC with heparin infusion (NO BOLUS) until able to resume oral AC.  Goal of Therapy:  Heparin level 0.3-0.7 units/ml aPTT 66-102 seconds Monitor platelets by anticoagulation protocol: Yes   Plan: Baseline HL and aPTT ordered prior to starting heparin infusion *NO BOLUS - HIGH RISK OF BLEEDING* Start heparin infusion at 800 units/hr Check anti-Xa level in 8 hours and daily while on heparin Continue to monitor H&H and platelets  Melquisedec Journey Rodriguez-Guzman PharmD, BCPS 04/27/2022 4:17 PM

## 2022-04-28 ENCOUNTER — Inpatient Hospital Stay: Payer: Medicare Other

## 2022-04-28 DIAGNOSIS — K631 Perforation of intestine (nontraumatic): Secondary | ICD-10-CM | POA: Diagnosis not present

## 2022-04-28 LAB — CBC
HCT: 33.7 % — ABNORMAL LOW (ref 39.0–52.0)
Hemoglobin: 10.5 g/dL — ABNORMAL LOW (ref 13.0–17.0)
MCH: 28.8 pg (ref 26.0–34.0)
MCHC: 31.2 g/dL (ref 30.0–36.0)
MCV: 92.3 fL (ref 80.0–100.0)
Platelets: 251 10*3/uL (ref 150–400)
RBC: 3.65 MIL/uL — ABNORMAL LOW (ref 4.22–5.81)
RDW: 17.2 % — ABNORMAL HIGH (ref 11.5–15.5)
WBC: 13.6 10*3/uL — ABNORMAL HIGH (ref 4.0–10.5)
nRBC: 0 % (ref 0.0–0.2)

## 2022-04-28 LAB — BASIC METABOLIC PANEL
Anion gap: 14 (ref 5–15)
BUN: 46 mg/dL — ABNORMAL HIGH (ref 8–23)
CO2: 27 mmol/L (ref 22–32)
Calcium: 7.2 mg/dL — ABNORMAL LOW (ref 8.9–10.3)
Chloride: 95 mmol/L — ABNORMAL LOW (ref 98–111)
Creatinine, Ser: 6.98 mg/dL — ABNORMAL HIGH (ref 0.61–1.24)
GFR, Estimated: 8 mL/min — ABNORMAL LOW (ref >60)
Glucose, Bld: 90 mg/dL (ref 70–99)
Potassium: 5 mmol/L (ref 3.5–5.1)
Sodium: 136 mmol/L (ref 135–145)

## 2022-04-28 LAB — APTT: aPTT: 55 seconds — ABNORMAL HIGH (ref 24–36)

## 2022-04-28 LAB — HEPARIN LEVEL (UNFRACTIONATED)
Heparin Unfractionated: <0.1 IU/mL — ABNORMAL LOW (ref 0.30–0.70)
Heparin Unfractionated: <0.1 IU/mL — ABNORMAL LOW (ref 0.30–0.70)
Heparin Unfractionated: 0.17 IU/mL — ABNORMAL LOW (ref 0.30–0.70)

## 2022-04-28 LAB — MAGNESIUM: Magnesium: 1.9 mg/dL (ref 1.7–2.4)

## 2022-04-28 LAB — HEPATITIS B SURFACE ANTIBODY, QUANTITATIVE: Hep B S AB Quant (Post): 23.5 m[IU]/mL (ref >9.9)

## 2022-04-28 LAB — PHOSPHORUS: Phosphorus: 8.8 mg/dL — ABNORMAL HIGH (ref 2.5–4.6)

## 2022-04-28 MED ORDER — IOHEXOL 180 MG/ML  SOLN: 150.0000 mL | Freq: Once | INTRAMUSCULAR | Status: DC | PRN

## 2022-04-28 MED ORDER — IOHEXOL 300 MG/ML  SOLN
150.0000 mL | Freq: Once | INTRAMUSCULAR | Status: AC | PRN
  Administered 2022-04-28: 75 mL via ORAL

## 2022-04-28 NOTE — TOC Initial Note (Signed)
Transition of Care [facility]) - Initial/Assessment Note    Patient Details  Name: Anthony Hess MRN: [id number] Date of Birth: December 04, 1955  [facility]) CM/SW Contact:    Laurena Slimmer, RN Phone Number: 04/28/2022, 2:27 PM  Clinical Narrative:                   Admitted for:abd pain r/t perforated duodenal ulcer repair and Phillip Heal patch.  Admitted from: Home with spouse  PCP: "I been working on that."  [facility]acy: [facility] Current home health/prior home health/DME: no DME Transportation: father-in-law  Patient stated he had been working on trying to get a PCP via [employer] He is agreeable to [employer] added to AVS    Expected Discharge Plan: Home/Self Care Barriers to Discharge: Continued Medical Work up   Patient Goals and CMS Choice Patient states their goals for this hospitalization and ongoing recovery are:: To return home          Expected Discharge Plan and Services                                              Prior Living Arrangements/Services     Patient language and need for interpreter reviewed:: Yes Do you feel safe going back to the place where you live?: Yes      Need for Family Participation in Patient Care: Yes (Comment) Care giver support system in place?: Yes (comment)   Criminal Activity/Legal Involvement Pertinent to Current Situation/Hospitalization: No - Comment as needed  Activities of Daily Living Home Assistive Devices/Equipment: Eyeglasses ADL Screening (condition at time of admission) Patient's cognitive ability adequate to safely complete daily activities?: Yes Is the patient deaf or have difficulty hearing?: No Does the patient have difficulty seeing, even when wearing glasses/contacts?: No Does the patient have difficulty concentrating, remembering, or making decisions?: No Patient able to express need for assistance with ADLs?: Yes Does the patient have  difficulty dressing or bathing?: No Independently performs ADLs?: Yes (appropriate for developmental age) Does the patient have difficulty walking or climbing stairs?: No Weakness of Legs: Both Weakness of Arms/Hands: None  Permission Sought/Granted                  Emotional Assessment Appearance:: Other (Comment Required Attitude/Demeanor/Rapport: Gracious, Engaged Affect (typically observed): Accepting Orientation: : Oriented to Self, Oriented to Place, Oriented to  Time, Oriented to Situation Alcohol / Substance Use: Not Applicable Psych Involvement: No (comment)  Admission diagnosis:  Pneumoperitoneum [K66.8] Hyperkalemia [E87.5] Perforation bowel (HCC) [K63.1] Abdominal pain, unspecified abdominal location [R10.9] Patient Active Problem List   Diagnosis Date Noted   Perforation bowel ([place]) 04/26/2022   NSTEMI (non-ST elevated myocardial infarction) ([place]) 04/19/2022   Acute pulmonary embolism ([place]) 04/19/2022   Dyslipidemia 04/19/2022   End-stage renal disease on hemodialysis ([place]) 04/19/2022   Tobacco dependence 04/19/2022   Hypertensive urgency 11/16/2021   Hyperkalemia 11/16/2021   Chronic back pain 11/16/2021   Abdominal pain    SBP (spontaneous bacterial peritonitis) ([place]) 04/08/2021   Peritonitis associated with peritoneal dialysis ([place]) 04/08/2021   Hypokalemia 04/08/2021   COVID-19 virus infection 04/08/2021   AAA (abdominal aortic aneurysm) ([place]) 03/18/2021   Hypocalcemia    Spontaneous bacterial peritonitis ([place]) 03/04/2021   Incisional hernia, without obstruction or gangrene    PD catheter dysfunction ([place]) 04/13/2020   Chronic kidney  disease due to hypertension 01/30/2020   Hyperparathyroidism due to renal insufficiency ([place]) 01/30/2020   Acute peritonitis ([place]) 01/08/2020   Hypotension 01/04/2020   Anemia in ESRD (end-stage renal disease) ([place]) 01/04/2020   Hyperlipidemia 01/04/2020   Mass of left side of neck 01/04/2020   Senile purpura ([place])  01/04/2020   Chronic combined systolic and diastolic CHF (congestive heart failure) ([place])    Hydroureteronephrosis    Atrial fibrillation ([place]) 01/05/2019   ESRD (end stage renal disease) ([place]) 01/05/2019   Anemia in chronic kidney disease ([facility]) 01/05/2019   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   Presence of aortocoronary bypass graft 12/27/2018   Emphysema lung ([place]) 04/14/2018   Bilateral hydronephrosis 04/03/2018   Hypertension 03/27/2018   Cigarette smoker 03/20/2018   History of bladder cancer 03/20/2018   PCP:  Patient, No Pcp Per Pharmacy:   [facility], [address] [place] [address] Phone: [phone] Fax: [phone]  [place] [facility] [address] [place] [address] Phone: [phone] Fax: [place], [place] - [address] [place] [address] Phone: [phone] Fax: [phone]     Social Determinants of Health (SDOH) Social History: SDOH Screenings   Food Insecurity: No Food Insecurity (04/27/2022)  Housing: Low Risk  (04/27/2022)  Transportation Needs: No Transportation Needs (04/27/2022)  Utilities: Not At Risk (04/27/2022)  Depression (PHQ2-9): Low Risk  (01/11/2020)  Tobacco Use: Medium Risk (04/27/2022)   SDOH Interventions:     Readmission Risk Interventions    04/28/2022    2:26 PM 11/18/2021    2:04 PM 04/09/2021    1:32 PM  Readmission Risk Prevention Plan  Transportation Screening Complete Complete Complete  Palliative Care Screening   Not Applicable  Medication Review (RN Care Manager) Complete Complete Complete  PCP or Specialist appointment within 3-5 days of discharge Complete Complete   HRI or Home Care Consult Complete    SW Recovery Care/Counseling Consult Complete Complete   Palliative Care Screening Not Applicable Not [place] Not Applicable  Not Applicable

## 2022-04-28 NOTE — Progress Notes (Signed)
[facility]  ROUNDING NOTE   Subjective:   Anthony Hess is a 67 year old male with past medical conditions including hypertension, PVD, dyslipidemia, CAD, four-vessel CABG, and end-stage renal disease on hemodialysis.  Patient presents to the emergency department with complaints of abdominal painand has been admitted for Pneumoperitoneum [K66.8] Hyperkalemia [E87.5] Perforation bowel ([place]) [K63.1] Abdominal pain, unspecified abdominal location [R10.9]  Patient is known to our practice from previous admissions and currently receives outpatient dialysis treatments at [facility] on a TTS schedule, supervised by Dr. Holley Raring.    Patient seen resting quietly in bed Alert and oriented Reports mild discomfort, managed with pain medications. NG tube in place, currently n.p.o.  Dialysis received yesterday, tolerated well  Objective:  Vital signs in last 24 hours:  Temp:  [97.8 F (36.6 C)-98.7 F (37.1 C)] 97.8 F (36.6 C) (03/06 0827) Pulse Rate:  [66-80] 66 (03/06 0827) Resp:  [16-20] 18 (03/06 0827) BP: (117-162)/(80-101) 160/97 (03/06 0827) SpO2:  [97 %-100 %] 98 % (03/06 0827) Weight:  [54.2 kg] 54.2 kg (03/05 1238)  Weight change:  Filed Weights   04/27/22 0841 04/27/22 1238  Weight: 56.2 kg 54.2 kg    Intake/Output: I/O last 3 completed shifts: In: 1081.7 [I.V.:806.7; IV Piggyback:275] Out: 1640 [Urine:20; Drains:90; Other:1500; Blood:30]   Intake/Output this shift:  No intake/output data recorded.  Physical Exam: General: NAD  Head: Normocephalic, atraumatic. Moist oral mucosal membranes  Eyes: Anicteric  Lungs:  Clear to auscultation, normal effort  Heart: Regular rate and rhythm  Abdomen:  Soft, tender, mild distention  Extremities: No peripheral edema.  Neurologic: Somnolent, moving all four extremities  Skin: No lesions, surgical dressing-old drainage  Access: Right PermCath    Basic Metabolic Panel: Recent Labs  Lab 04/26/22 1518  04/26/22 1932 04/27/22 0535 04/28/22 0345  NA 138  --  133* 136  K 5.9* 5.0 5.7* 5.0  CL 102  --  94* 95*  CO2 17*  --  24 27  GLUCOSE 98  --  181* 90  BUN 77*  --  84* 46*  CREATININE 9.90*  --  10.42* 6.98*  CALCIUM 7.4*  --  7.4* 7.2*  MG  --   --  2.0 1.9  PHOS  --   --  10.1* 8.8*     Liver Function Tests: Recent Labs  Lab 04/26/22 1518  AST 15  ALT 15  ALKPHOS 52  BILITOT 0.5  PROT 7.2  ALBUMIN 3.8    Recent Labs  Lab 04/26/22 1518  LIPASE 38    No results for input(s): "AMMONIA" in the last 168 hours.  CBC: Recent Labs  Lab 04/26/22 1518 04/27/22 0535 04/28/22 0345  WBC 17.2* 22.9* 13.6*  HGB 12.4* 11.5* 10.5*  HCT 40.9 36.3* 33.7*  MCV 95.6 92.1 92.3  PLT 323 283 251     Cardiac Enzymes: No results for input(s): "CKTOTAL", "CKMB", "CKMBINDEX", "TROPONINI" in the last 168 hours.  BNP: Invalid input(s): "POCBNP"  CBG: No results for input(s): "GLUCAP" in the last 168 hours.  Microbiology: Results for orders placed or performed during the hospital encounter of 04/26/22  Resp panel by RT-PCR (RSV, Flu A&B, Covid) Anterior Nasal Swab     Status: None   Collection Time: 04/26/22  6:43 PM   Specimen: Anterior Nasal Swab  Result Value Ref Range Status   SARS Coronavirus 2 by RT PCR NEGATIVE NEGATIVE Final    Comment: (NOTE) SARS-CoV-2 target nucleic acids are NOT DETECTED.  The SARS-CoV-2 RNA is  generally detectable in upper respiratory specimens during the acute phase of infection. The lowest concentration of SARS-CoV-2 viral copies this assay can detect is 138 copies/mL. A negative result does not preclude SARS-Cov-2 infection and should not be used as the sole basis for treatment or other patient management decisions. A negative result may occur with  improper specimen collection/handling, submission of specimen other than nasopharyngeal swab, presence of viral mutation(s) within the areas targeted by this assay, and inadequate number  of viral copies(<138 copies/mL). A negative result must be combined with clinical observations, patient history, and epidemiological information. The expected result is Negative.  Fact Sheet for Patients:  EntrepreneurPulse.com.au  Fact Sheet for Healthcare Providers:  IncredibleEmployment.be  This test is no t yet approved or cleared by the [place] FDA and  has been authorized for detection and/or diagnosis of SARS-CoV-2 by FDA under an Emergency Use Authorization (EUA). This EUA will remain  in effect (meaning this test can be used) for the duration of the COVID-19 declaration under Section 564(b)(1) of the Act, 21 U.S.C.section 360bbb-3(b)(1), unless the authorization is terminated  or revoked sooner.       Influenza A by PCR NEGATIVE NEGATIVE Final   Influenza B by PCR NEGATIVE NEGATIVE Final    Comment: (NOTE) The Xpert Xpress SARS-CoV-2/FLU/RSV plus assay is intended as an aid in the diagnosis of influenza from Nasopharyngeal swab specimens and should not be used as a sole basis for treatment. Nasal washings and aspirates are unacceptable for Xpert Xpress SARS-CoV-2/FLU/RSV testing.  Fact Sheet for Patients: EntrepreneurPulse.com.au  Fact Sheet for Healthcare Providers: IncredibleEmployment.be  This test is not yet approved or cleared by the [place] FDA and has been authorized for detection and/or diagnosis of SARS-CoV-2 by FDA under an Emergency Use Authorization (EUA). This EUA will remain in effect (meaning this test can be used) for the duration of the COVID-19 declaration under Section 564(b)(1) of the Act, 21 U.S.C. section 360bbb-3(b)(1), unless the authorization is terminated or revoked.     Resp Syncytial Virus by PCR NEGATIVE NEGATIVE Final    Comment: (NOTE) Fact Sheet for Patients: EntrepreneurPulse.com.au  Fact Sheet for Healthcare  Providers: IncredibleEmployment.be  This test is not yet approved or cleared by the [place] FDA and has been authorized for detection and/or diagnosis of SARS-CoV-2 by FDA under an Emergency Use Authorization (EUA). This EUA will remain in effect (meaning this test can be used) for the duration of the COVID-19 declaration under Section 564(b)(1) of the Act, 21 U.S.C. section 360bbb-3(b)(1), unless the authorization is terminated or revoked.  Performed at [facility], [address] [place], [place] [address]     Coagulation Studies: No results for input(s): "LABPROT", "INR" in the last 72 hours.   Urinalysis: No results for input(s): "COLORURINE", "LABSPEC", "PHURINE", "GLUCOSEU", "HGBUR", "BILIRUBINUR", "KETONESUR", "PROTEINUR", "UROBILINOGEN", "NITRITE", "LEUKOCYTESUR" in the last 72 hours.  Invalid input(s): "APPERANCEUR"    Imaging: DG UGI W SINGLE CM (SOL OR THIN BA)  Result Date: 04/28/2022 CLINICAL DATA:  Patient is 2 days status post duodenal ulcer repair. EXAM: DG UGI W SINGLE CM TECHNIQUE: Single contrast examination was then performed using thin liquid barium. This exam was performed by Reatha Armour, PA-C , and was supervised and interpreted by Dr Kathreen Devoid. FLUOROSCOPY: Radiation Exposure Index (as provided by the fluoroscopic device): 10.20 mGy Kerma COMPARISON:  None Available. FINDINGS: Esophagus:  Normal appearance. Esophageal motility:  Within normal limits. Gastroesophageal reflux:  None visualized. Ingested 58m barium tablet: Not given  Stomach: Normal appearance. No hiatal hernia. Gastric emptying: Normal. Duodenum: Normal appearance. No contrast extravasation visualized on today's exam. Other:  None. IMPRESSION: Limited upper GI study conducted secondary to limited mobility and limited ability to tolerate contrast material. No extraluminal contrast to suggest a duodenal leak or perforation. If there is persistent clinical  concern, recommend a CT of the abdomen with oral contrast only. Electronically Signed   By: Kathreen Devoid M.D.   On: 04/28/2022 11:05   CT ABDOMEN PELVIS WO CONTRAST  Result Date: 04/26/2022 CLINICAL DATA:  Right lower quadrant abdominal pain. EXAM: CT ABDOMEN AND PELVIS WITHOUT CONTRAST TECHNIQUE: Multidetector CT imaging of the abdomen and pelvis was performed following the standard protocol without IV contrast. RADIATION DOSE REDUCTION: This exam was performed according to the departmental dose-optimization program which includes automated exposure control, adjustment of the mA and/or kV according to patient size and/or use of iterative reconstruction technique. COMPARISON:  CT abdomen pelvis dated 04/19/2022. FINDINGS: Evaluation of this exam is limited in the absence of intravenous contrast. Evaluation is also limited due to respiratory motion and paucity of intra-abdominal fat. Lower chest: The visualized lung bases are clear. There is pneumoperitoneum and small ascites. Hepatobiliary: A 2 cm hypodense lesion in the dome of the liver not characterized on this CT but was present on the prior CT. Small hypodense lesion in the left lobe of the liver also present on the prior CT. A small cyst noted in the right lobe 1 of the liver inferiorly. No dilatation. There is layering sludge and small stones within the gallbladder. No pericholecystic fluid. Pancreas: The pancreas is grossly unremarkable as visualized. Spleen: Normal in size without focal abnormality. Adrenals/Urinary Tract: The adrenal glands are unremarkable. Moderate bilateral renal parenchyma atrophy. There is no hydronephrosis or nephrolithiasis on either side. The urinary bladder is minimally distended and grossly unremarkable. Stomach/Bowel: There is no bowel obstruction. The appendix is unremarkable as visualized. Vascular/Lymphatic: Advanced aortoiliac atherosclerotic disease. The aorta is tortuous. The IVC is grossly unremarkable. No obvious  adenopathy. Reproductive: Mildly enlarged prostate gland measuring 4.5 cm in transverse axial diameter. Other: None Musculoskeletal: Degenerative changes of the spine and scoliosis. No acute osseous pathology. IMPRESSION: 1. Pneumoperitoneum and small ascites. Findings are concerning for bowel perforation. Surgical consult is advised. 2. Cholelithiasis. 3. No hydronephrosis or nephrolithiasis. 4. No bowel obstruction. Normal appendix. 5.  Aortic Atherosclerosis (ICD10-I70.0). These results were called by telephone at the time of interpretation on 04/26/2022 at 4:49 pm to provider [facility] , who verbally acknowledged these results. Electronically Signed   By: Anner Crete M.D.   On: 04/26/2022 16:51   DG Chest Portable 1 View  Result Date: 04/26/2022 CLINICAL DATA:  Shortness of breath, abdominal pain EXAM: PORTABLE CHEST 1 VIEW COMPARISON:  Previous studies including the examination of 04/19/2022 FINDINGS: Transverse diameter of heart is slightly increased. Central pulmonary vessels are slightly prominent. Thoracic aorta is tortuous and ectatic. Tip of dialysis catheter is seen at the junction of superior vena cava and right atrium. There is previous coronary bypass surgery. Faint lucency is seen in the right hemidiaphragm. Patient is scheduled for CT abdomen and pelvis. Dextroscoliosis is seen in thoracic spine. IMPRESSION: There are no signs of pulmonary edema or focal pulmonary consolidation. There is no pleural effusion or pneumothorax. Faint lucencies are seen under the right hemidiaphragm suggesting possible pneumoperitoneum. Electronically Signed   By: Elmer Picker M.D.   On: 04/26/2022 16:43     Medications:    sodium chloride 250  mL (04/28/22 0601)   heparin 1,300 Units/hr (04/28/22 1153)   piperacillin-tazobactam (ZOSYN)  IV 2.25 g (04/28/22 0601)     atorvastatin  80 mg Oral QHS   calcitRIOL  0.5 mcg Oral Daily   Chlorhexidine Gluconate Cloth  6 each Topical Q0600    hydrALAZINE  25 mg Oral TID   isosorbide dinitrate  10 mg Oral TID   pantoprazole (PROTONIX) IV  40 mg Intravenous Q12H   sodium chloride flush  3 mL Intravenous Q12H   sodium chloride flush  3 mL Intravenous Q12H   sodium chloride, acetaminophen **OR** acetaminophen, hydrALAZINE, HYDROmorphone (DILAUDID) injection, metoprolol tartrate, ondansetron **OR** ondansetron (ZOFRAN) IV, sodium chloride flush  Assessment/ Plan:  Mr. Rutger Feest is a 67 y.o.  male  Traeden Worsham is a 67 year old male with past medical conditions including hypertension, PVD, dyslipidemia, CAD, four-vessel CABG, and end-stage renal disease on hemodialysis.  Patient presents to the emergency department with complaints of shortness of breath and has been admitted for Pneumoperitoneum [K66.8] Hyperkalemia [E87.5] Perforation bowel ([place]) [K63.1] Abdominal pain, unspecified abdominal location [R10.9]  [facility]/TTS/right PermCath.  End-stage renal disease with hyperkalemia on hemodialysis.    Dialysis received yesterday, UF 1.5 L achieved.  Next treatment scheduled for Thursday.  2. Anemia of chronic kidney disease Lab Results  Component Value Date   HGB 10.5 (L) 04/28/2022    Hemoglobin remained stable postoperatively.  Patient receives Mircera at outpatient clinic  3. Secondary Hyperparathyroidism: with outpatient labs: PTH 532, phosphorus 10.7, calcium 6.1 on 02/11/22.  :  Lab Results  Component Value Date   PTH 159 (H) 12/27/2019   CALCIUM 7.2 (L) 04/28/2022   CAION 0.84 (LL) 09/11/2021   PHOS 8.8 (H) 04/28/2022    Hypocalcemia/hyperphosphatemia-continue daily calcitriol.  Prescribed calcium acetate and ergocalciferol outpatient.  Will continue to hold binders for now due to abdominal procedure.  4.  Hypertension with chronic kidney disease.  Home regimen includes amlodipine, clonidine, metoprolol, Entresto and losartan.  Currently receiving hydralazine, isosorbide, metoprolol, and  Entresto.  Blood pressure slightly elevated 160/97.  5.  Bowel perforation, surgery consulted and performed bowel repair with Phillip Heal patch on 04/26/2022.  GI imaging series completed this morning, findings acceptable.  Surgery plans to DC NG tube and begin clear liquid diet.   LOS: 2 Saamiya Jeppsen 3/6/202412:12 PM

## 2022-04-28 NOTE — Progress Notes (Signed)
[place] for heparin infusion Indication: pulmonary embolus  (New PE  started on Eliquis on 2/28, reversal with Kcentra and Vitamin K  n 3/4 for emergency surgery)  No Known Allergies  Patient Measurements: Weight: 54.2 kg (119 lb 7.8 oz) Heparin Dosing Weight: 54.2 kg  Vital Signs: Temp: 97.8 F (36.6 C) (03/06 0827) Temp Source: Oral (03/06 0827) BP: 160/97 (03/06 0827) Pulse Rate: 66 (03/06 0827)  Labs: Recent Labs    04/26/22 1518 04/27/22 0535 04/27/22 1424 04/27/22 2303 04/28/22 0345 04/28/22 0927  HGB 12.4* 11.5*  --   --  10.5*  --   HCT 40.9 36.3*  --   --  33.7*  --   PLT 323 283  --   --  251  --   APTT  --   --  32  --   --   --   HEPARINUNFRC  --   --  <0.10* <0.10*  --  <0.10*  CREATININE 9.90* 10.42*  --   --  6.98*  --     Estimated Creatinine Clearance: 8.2 mL/min (A) (by C-G formula based on SCr of 6.98 mg/dL (H)).  Medical History: Past Medical History:  Diagnosis Date   Aortic atherosclerosis ([place])    Bilateral carotid artery disease ([place])    Bladder cancer ([place])    Coronary artery disease 12/20/2018   a.) LHC 12/20/2018: 50% OM1, 40% OM2, 95% o-pLAD, 75% o=pLCx, 40% mLM, 70% D1, 60% mRCA-1, 50% mRCA-2; refer to CVTS. b.) 4v CABG at [facility] on 12/27/2018: LIMA-LAD, RIMA-PDA, seg LRA-OM1-D1   DCM (dilated cardiomyopathy) ([place]) 12/05/2018   a.) TTE 12/05/2018: EF 40-45%. b.) TTE 12/28/2019: EF 20-25%.   ESRD (end stage renal disease) ([place])    a.) T-Th-Sat   HFrEF (heart failure with reduced ejection fraction) ([place]) 12/05/2018   a.) TTE 12/05/2018: EF 40-45%; mild LVH; ant/apical/sep HK; mild TR . b.) TTE 12/28/2019: EF 20-25%; mod LVH; mod MR/AR; G1DD.   History of 2019 novel coronavirus disease (COVID-19) 04/08/2021   History of kidney stones    HLD (hyperlipidemia)    Hypertension    Infrarenal abdominal aortic aneurysm (AAA) without rupture ([place]) 03/05/2021   a.) CT abd/pelvis; measured 3.2 cm.    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 PMH includes CAD s/p CABG x 4 (2020), HFrEF (20-25%, g1DD, mod MR, mi-mod AR 2021), ESRD (T,TH,S HD), PAD, hx tobacco. Most recent acute care admission was 2/28 for excessive bleeding. New dx of PE during admission and discharged on Eliquis. Reversal with Kcentra and Vitamin K on 3/4 at prior to emergency repair surgery.   Pharmacy consulted to resume AC with heparin infusion (NO BOLUS) until able to resume oral AC.  Results: 3/5 @ 2303 HL < 0.10 Subtherapeutic, heparin infusion rate 800un/hr 3/6 @ 0927 HL <0.10, subtherapeutic at 950 un/hr  Goal of Therapy:  Heparin level 0.3-0.7 units/ml aPTT 66-102 seconds Monitor platelets by anticoagulation protocol: Yes   Plan: Baseline HL and aPTT ordered prior to starting heparin infusion *NO BOLUS - HIGH RISK OF BLEEDING* Increase heparin infusion to 1300 units/hr  Recheck HL and aPTT  8 hr after rate change CBC daily while on heparin  Anthony Hess PharmD, BCPS 04/28/2022 10:58 AM

## 2022-04-28 NOTE — Anesthesia Postprocedure Evaluation (Signed)
Anesthesia Post Note  Patient: Anthony Hess  Procedure(s) Performed: EXPLORATORY LAPAROTOMY WITH REPAIR OF DUODENAL ULCER  Patient location during evaluation: PACU Anesthesia Type: General Level of consciousness: awake and alert Pain management: pain level controlled Vital Signs Assessment: post-procedure vital signs reviewed and stable Respiratory status: spontaneous breathing, nonlabored ventilation, respiratory function stable and patient connected to nasal cannula oxygen Cardiovascular status: blood pressure returned to baseline and stable Postop Assessment: no apparent nausea or vomiting Anesthetic complications: no   No notable events documented.   Last Vitals:  Vitals:   04/27/22 2341 04/28/22 0537  BP: (!) 147/83 (!) 162/101  Pulse: 66 70  Resp: 18 18  Temp: 36.6 C 37.1 C  SpO2: 97% 97%    Last Pain:  Vitals:   04/28/22 0537  TempSrc: Oral  PainSc:                  Arita Miss

## 2022-04-28 NOTE — Progress Notes (Signed)
Patient ID: Anthony Hess, male   DOB: 01/14/1956, 67 y.o.   MRN: [id number]     [place] Hospital Day(s): 2.   Interval History: Patient seen and examined, no acute events or new complaints overnight. Patient reports feeling well this morning.  He denies abdominal pain.  He denies any nausea or vomiting.  Vital signs in last 24 hours: [min-max] current  Temp:  [97.8 F (36.6 C)-98.7 F (37.1 C)] 97.8 F (36.6 C) (03/06 0827) Pulse Rate:  [66-80] 66 (03/06 0827) Resp:  [15-20] 18 (03/06 0827) BP: (110-162)/(80-101) 160/97 (03/06 0827) SpO2:  [97 %-100 %] 98 % (03/06 0827) Weight:  [54.2 kg] 54.2 kg (03/05 1238)       Weight: 54.2 kg BMI (Calculated): 21.17   Physical Exam:  Constitutional: alert, cooperative and no distress  Respiratory: breathing non-labored at rest  Cardiovascular: regular rate and sinus rhythm  Gastrointestinal: soft, non-tender, and non-distended..  Drain serosanguineous.  Labs:     Latest Ref Rng & Units 04/28/2022    3:45 AM 04/27/2022    5:35 AM 04/26/2022    3:18 PM  CBC  WBC 4.0 - 10.5 K/uL 13.6  22.9  17.2   Hemoglobin 13.0 - 17.0 g/dL 10.5  11.5  12.4   Hematocrit 39.0 - 52.0 % 33.7  36.3  40.9   Platelets 150 - 400 K/uL 251  283  323       Latest Ref Rng & Units 04/28/2022    3:45 AM 04/27/2022    5:35 AM 04/26/2022    7:32 PM  CMP  Glucose 70 - 99 mg/dL 90  181    BUN 8 - 23 mg/dL 46  84    Creatinine 0.61 - 1.24 mg/dL 6.98  10.42    Sodium 135 - 145 mmol/L 136  133    Potassium 3.5 - 5.1 mmol/L 5.0  5.7  5.0   Chloride 98 - 111 mmol/L 95  94    CO2 22 - 32 mmol/L 27  24    Calcium 8.9 - 10.3 mg/dL 7.2  7.4      Imaging studies: Upper GI series this morning shows no contrast extravasation.   Assessment/Plan:  67 y.o. male with perforated duodenal ulcer 2 Day Post-Op s/p duodenal ulcer repair, complicated by pertinent comorbidities including recent non-STEMI, recent PE on Eliquis, end-stage renal disease on hemodialysis.    -Patient with stable vital signs.  He seems to be recovering adequately. -Level cell count decreased to 13,000 from 22,000. -Upper GI series this morning event that was limited shows no construct extravasation.  Drain output is serosanguineous without concern of bile output. -Will discontinue NGT and start clear liquid diet and assess for toleration -Continue medical management  -Encourage patient to ambulate  Arnold Long, MD

## 2022-04-28 NOTE — Consult Note (Signed)
[place] for heparin infusion Indication: pulmonary embolus  (New PE  started on Eliquis on 2/28, reversal with Kcentra and Vitamin K  n 3/4 for emergency surgery)  No Known Allergies  Patient Measurements: Weight: 54.2 kg (119 lb 7.8 oz) Heparin Dosing Weight: 54.2 kg  Vital Signs: Temp: 97.9 F (36.6 C) (03/05 2341) Temp Source: Oral (03/05 1646) BP: 147/83 (03/05 2341) Pulse Rate: 66 (03/05 2341)  Labs: Recent Labs    04/26/22 1518 04/27/22 0535 04/27/22 1424 04/27/22 2303  HGB 12.4* 11.5*  --   --   HCT 40.9 36.3*  --   --   PLT 323 283  --   --   APTT  --   --  32  --   HEPARINUNFRC  --   --  <0.10* <0.10*  CREATININE 9.90* 10.42*  --   --     Estimated Creatinine Clearance: 5.5 mL/min (A) (by C-G formula based on SCr of 10.42 mg/dL (H)).  Medical History: Past Medical History:  Diagnosis Date   Aortic atherosclerosis ([place])    Bilateral carotid artery disease ([place])    Bladder cancer ([place])    Coronary artery disease 12/20/2018   a.) LHC 12/20/2018: 50% OM1, 40% OM2, 95% o-pLAD, 75% o=pLCx, 40% mLM, 70% D1, 60% mRCA-1, 50% mRCA-2; refer to CVTS. b.) 4v CABG at [facility] on 12/27/2018: LIMA-LAD, RIMA-PDA, seg LRA-OM1-D1   DCM (dilated cardiomyopathy) ([place]) 12/05/2018   a.) TTE 12/05/2018: EF 40-45%. b.) TTE 12/28/2019: EF 20-25%.   ESRD (end stage renal disease) ([place])    a.) T-Th-Sat   HFrEF (heart failure with reduced ejection fraction) ([place]) 12/05/2018   a.) TTE 12/05/2018: EF 40-45%; mild LVH; ant/apical/sep HK; mild TR . b.) TTE 12/28/2019: EF 20-25%; mod LVH; mod MR/AR; G1DD.   History of 2019 novel coronavirus disease (COVID-19) 04/08/2021   History of kidney stones    HLD (hyperlipidemia)    Hypertension    Infrarenal abdominal aortic aneurysm (AAA) without rupture ([place]) 03/05/2021   a.) CT abd/pelvis; measured 3.2 cm.   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 PMH includes CAD s/p CABG x 4 (2020), HFrEF (20-25%, g1DD, mod MR, mi-mod AR 2021), ESRD (T,TH,S HD), PAD, hx tobacco. Most recent acute care admission was 2/28 for excessive bleeding. New dx of PE during admission and discharged on Eliquis. Reversal with Kcentra and Vitamin K on 3/4 at prior to emergency repair surgery.   Pharmacy consulted to resume AC with heparin infusion (NO BOLUS) until able to resume oral AC.  Goal of Therapy:  Heparin level 0.3-0.7 units/ml aPTT 66-102 seconds Monitor platelets by anticoagulation protocol: Yes   Plan: Baseline HL and aPTT ordered prior to starting heparin infusion *NO BOLUS - HIGH RISK OF BLEEDING* Increase heparin infusion to 950 units/hr Recheck HL in 8 hr after rate change CBC daily while on heparin  Renda Rolls, PharmD, [facility] 04/28/2022 1:04 AM

## 2022-04-28 NOTE — Consult Note (Signed)
Pharmacy Antibiotic Note  Anthony Hess is a 67 y.o. male admitted on 04/26/2022 with intra-abdominal infection/bowel perforation.  Patient remains NPO. WBC improving and afebrile. Pharmacy has been consulted for Zosyn dosing.  Patient is ESRD on HD. Zosyn 3.375 grams IV x 1 given 04/26/22 @ 1704  Plan: Continue Zosyn 2.25 grams IV every 8 hours Weight: 54.2 kg (119 lb 7.8 oz)  Temp (24hrs), Avg:98.2 F (36.8 C), Min:97.8 F (36.6 C), Max:98.7 F (37.1 C)  Recent Labs  Lab 04/26/22 1518 04/27/22 0535 04/28/22 0345  WBC 17.2* 22.9* 13.6*  CREATININE 9.90* 10.42* 6.98*     Estimated Creatinine Clearance: 8.2 mL/min (A) (by C-G formula based on SCr of 6.98 mg/dL (H)).    No Known Allergies  Antimicrobials this admission: Zosyn 3/4 >>   Microbiology results: 2/26 MRSA PCR - detected 3/4: resp panel : negative  Thank you for allowing pharmacy to be a part of this patient's care.  Valora Norell Rodriguez-Guzman PharmD, BCPS 04/28/2022 10:50 AM

## 2022-04-28 NOTE — Progress Notes (Signed)
[facility] Progress Note  Patient: Anthony Hess    [id number]  DOA: 04/26/2022     Date of Service: the patient was seen and examined on 04/28/2022  Chief Complaint  Patient presents with   Abdominal Pain   Brief hospital course: Mccartney Montour is a 67 y.o. male with PMH of HTN, HLD, CAD s/p CABG,  chronic systolic CHF, paroxysmal A-fib and PE (04/19/22) on DOAC, PVD, ESRD on HD TTS schedule, infrarenal abdominal aortic aneurysm without rupture, presented at [facility] ED with complaining of abdominal pain.  As per patient he was having abdominal pain for past few days, last night pain became worse and very sharp in the right side of abdomen.  Patient denied any nausea vomiting.   ED Course: Hypertensive, BP 206/132 highest,  Sodium 133, potassium 5.9 hyperkalemia, bicarb 17, metabolic acidosis, blood glucose 98, BUN 77 creatinine 9.9, calcium 7.4  CBC WBC 17.2, Hb 12.4.  COVID and flu negative CT A/P: Pneumoperitoneum and small ascites. Findings are concerning for bowel perforation. Surgical consult is advised.   General surgery and nephrology was consulted by ED physician, [facility] hospitalist team was consulted for admission and further management.   Assessment and Plan:  # Perforated duodenal ulcer  Patient presented with sudden onset of abdominal pain, CT scan showed pneumoperitoneum  Continue IV Zosyn, pharmacy was consulted. Continue pantoprazole 40 mg IV twice daily Continue as needed medication for pain control General surgery consulted, s/p duodenal perforation repair done on 3/4 3/6 Upper GI series: No extraluminal contrast to suggest a duodenal leak or perforation. If there is persistent clinical concern, recommend a CT of the abdomen with oral contrast only. Clear liquid diet was started, advance as per general surgery    # Hyperkalemia due to ESRD, resolved after HD Patient was given IV calcium in the ED Insulin with dextrose ordered Monitor potassium level     # ESRD  on hemodialysis TTS schedule Metabolic acidosis due to ESRD, s/p bicarb IV infusion  Nephrology was consulted     # CAD s/p CABG, paroxysmal A-fib on DOAC, HTN, HLD, PVD, infrarenal abdominal aortic aneurysm without rupture Uncontrolled blood pressure could be due to pain and missed morning medications. Resumed statin, hydralazine and Imdur home dose Use IV Lopressor and IV hydralazine as needed We will continue monitor BP and titrate medication accordingly    # History of pulmonary embolism Patient was on Eliquis which was held due to surgery 3/5 started heparin IV infusion without bolus We will follow general surgery to start [place] when safe to do so   Body mass index is 21.17 kg/m.  Interventions:       Diet: CLD DVT Prophylaxis: Therapeutic Anticoagulation with heparin IV infusion    Advance goals of care discussion: Full code  Family Communication: family was not present at bedside, at the time of interview.  The pt provided permission to discuss medical plan with the family. Opportunity was given to ask question and all questions were answered satisfactorily.   Disposition:  Pt is from Home, admitted with duodenal ulcer perforation, started clear liquid diet, still on IV Abx, which precludes a safe discharge. Discharge to Home, when stable, may need few days more to improve.  Subjective: No significant events overnight, abdominal pain is improving, it is 7/10, patient had no BM and is still not passing gas but feels gurgling bowel sounds.  Denied any chest pain or palpitation, no shortness of breath.  Patient was hungry and was asking for food.  Awaiting for upper GI series report.  Clear liquid diet was started after the report by general surgery.   Physical Exam: General: NAD, lying comfortably Appear in no distress, affect appropriate Eyes: PERRLA ENT: Oral Mucosa Clear, moist  Neck: no JVD,  Cardiovascular: S1 and S2 Present, no Murmur,  Respiratory: good  respiratory effort, Bilateral Air entry equal and Decreased, no Crackles, no wheezes Abdomen: Bowel Sound present, Soft and mild generalized tenderness, dressing CDI Skin: no rashes Extremities: no Pedal edema, no calf tenderness Neurologic: without any new focal findings Gait not checked due to patient safety concerns  Vitals:   04/27/22 2341 04/28/22 0537 04/28/22 0827 04/28/22 1228  BP: (!) 147/83 (!) 162/101 (!) 160/97 (!) 144/97  Pulse: 66 70 66 76  Resp: '18 18 18 18  '$ Temp: 97.9 F (36.6 C) 98.7 F (37.1 C) 97.8 F (36.6 C) 97.9 F (36.6 C)  TempSrc:  Oral Oral   SpO2: 97% 97% 98% (!) 89%  Weight:        Intake/Output Summary (Last 24 hours) at 04/28/2022 1423 Last data filed at 04/28/2022 0900 Gross per 24 hour  Intake 156.71 ml  Output 60 ml  Net 96.71 ml   Filed Weights   04/27/22 0841 04/27/22 1238  Weight: 56.2 kg 54.2 kg    Data Reviewed: I have personally reviewed and interpreted daily labs, tele strips, imagings as discussed above. I reviewed all nursing notes, pharmacy notes, vitals, pertinent old records I have discussed plan of care as described above with RN and patient/family.  CBC: Recent Labs  Lab 04/26/22 1518 04/27/22 0535 04/28/22 0345  WBC 17.2* 22.9* 13.6*  HGB 12.4* 11.5* 10.5*  HCT 40.9 36.3* 33.7*  MCV 95.6 92.1 92.3  PLT 323 283 [id number]   Basic Metabolic Panel: Recent Labs  Lab 04/26/22 1518 04/26/22 1932 04/27/22 0535 04/28/22 0345  NA 138  --  133* 136  K 5.9* 5.0 5.7* 5.0  CL 102  --  94* 95*  CO2 17*  --  24 27  GLUCOSE 98  --  181* 90  BUN 77*  --  84* 46*  CREATININE 9.90*  --  10.42* 6.98*  CALCIUM 7.4*  --  7.4* 7.2*  MG  --   --  2.0 1.9  PHOS  --   --  10.1* 8.8*    Studies: DG UGI W SINGLE CM (SOL OR THIN BA)  Result Date: 04/28/2022 CLINICAL DATA:  Patient is 2 days status post duodenal ulcer repair. EXAM: DG UGI W SINGLE CM TECHNIQUE: Single contrast examination was then performed using thin liquid barium. This  exam was performed by Reatha Armour, PA-C , and was supervised and interpreted by Dr Kathreen Devoid. FLUOROSCOPY: Radiation Exposure Index (as provided by the fluoroscopic device): 10.20 mGy Kerma COMPARISON:  None Available. FINDINGS: Esophagus:  Normal appearance. Esophageal motility:  Within normal limits. Gastroesophageal reflux:  None visualized. Ingested 80m barium tablet: Not given Stomach: Normal appearance. No hiatal hernia. Gastric emptying: Normal. Duodenum: Normal appearance. No contrast extravasation visualized on today's exam. Other:  None. IMPRESSION: Limited upper GI study conducted secondary to limited mobility and limited ability to tolerate contrast material. No extraluminal contrast to suggest a duodenal leak or perforation. If there is persistent clinical concern, recommend a CT of the abdomen with oral contrast only. Electronically Signed   By: HKathreen DevoidM.D.   On: 04/28/2022 11:05    Scheduled Meds:  atorvastatin  80 mg Oral QHS   calcitRIOL  0.5 mcg Oral Daily   Chlorhexidine Gluconate Cloth  6 each Topical Q0600   hydrALAZINE  25 mg Oral TID   isosorbide dinitrate  10 mg Oral TID   pantoprazole (PROTONIX) IV  40 mg Intravenous Q12H   sodium chloride flush  3 mL Intravenous Q12H   sodium chloride flush  3 mL Intravenous Q12H   Continuous Infusions:  sodium chloride 250 mL (04/28/22 0601)   heparin 1,300 Units/hr (04/28/22 1153)   piperacillin-tazobactam (ZOSYN)  IV 2.25 g (04/28/22 0601)   PRN Meds: sodium chloride, acetaminophen **OR** acetaminophen, hydrALAZINE, HYDROmorphone (DILAUDID) injection, metoprolol tartrate, ondansetron **OR** ondansetron (ZOFRAN) IV, sodium chloride flush  Time spent: 35 minutes  Author: Val Riles. MD [facility] 04/28/2022 2:23 PM  To reach On-call, see care teams to locate the attending and reach out to them via [email or web address]. If 7PM-7AM, please contact night-coverage If you still have difficulty reaching the attending  provider, please page the [facility] (Director on Call) for [facility] on amion for assistance.

## 2022-04-28 NOTE — Progress Notes (Signed)
[facility] for Heparin Infusion Indication: pulmonary embolus  New PE (04/19/22) - started on Eliquis on 2/28, reversal with Kcentra and Vitamin K on 3/4 for emergency surgery  No Known Allergies  Patient Measurements: Weight: 54.2 kg (119 lb 7.8 oz) Heparin Dosing Weight: 54.2 kg  Vital Signs: Temp: 98.4 F (36.9 C) (03/06 1654) Temp Source: Oral (03/06 0827) BP: 144/81 (03/06 1654) Pulse Rate: 76 (03/06 1654)  Labs: Recent Labs    04/26/22 1518 04/27/22 0535 04/27/22 1424 04/27/22 2303 04/28/22 0345 04/28/22 0927  HGB 12.4* 11.5*  --   --  10.5*  --   HCT 40.9 36.3*  --   --  33.7*  --   PLT 323 283  --   --  251  --   APTT  --   --  32  --   --   --   HEPARINUNFRC  --   --  <0.10* <0.10*  --  <0.10*  CREATININE 9.90* 10.42*  --   --  6.98*  --     Estimated Creatinine Clearance: 8.2 mL/min (A) (by C-G formula based on SCr of 6.98 mg/dL (H)).  Medical History: Past Medical History:  Diagnosis Date   Aortic atherosclerosis ([place])    Bilateral carotid artery disease ([place])    Bladder cancer ([place])    Coronary artery disease 12/20/2018   a.) LHC 12/20/2018: 50% OM1, 40% OM2, 95% o-pLAD, 75% o=pLCx, 40% mLM, 70% D1, 60% mRCA-1, 50% mRCA-2; refer to CVTS. b.) 4v CABG at [facility] on 12/27/2018: LIMA-LAD, RIMA-PDA, seg LRA-OM1-D1   DCM (dilated cardiomyopathy) ([place]) 12/05/2018   a.) TTE 12/05/2018: EF 40-45%. b.) TTE 12/28/2019: EF 20-25%.   ESRD (end stage renal disease) ([place])    a.) T-Th-Sat   HFrEF (heart failure with reduced ejection fraction) ([place]) 12/05/2018   a.) TTE 12/05/2018: EF 40-45%; mild LVH; ant/apical/sep HK; mild TR . b.) TTE 12/28/2019: EF 20-25%; mod LVH; mod MR/AR; G1DD.   History of 2019 novel coronavirus disease (COVID-19) 04/08/2021   History of kidney stones    HLD (hyperlipidemia)    Hypertension    Infrarenal abdominal aortic aneurysm (AAA) without rupture ([place]) 03/05/2021   a.) CT abd/pelvis; measured 3.2 cm.    Myocardial infarction [facility])    PVD (peripheral vascular disease) ([facility])    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   a.) LIMA-LAD, RIMA-PDA, sequential LEFT radial artery to OM1 and D1   Sepsis ([place]) 03/14/2021   Wears glasses    Medications:  Scheduled:   atorvastatin  80 mg Oral QHS   calcitRIOL  0.5 mcg Oral Daily   Chlorhexidine Gluconate Cloth  6 each Topical Q0600   hydrALAZINE  25 mg Oral TID   isosorbide dinitrate  10 mg Oral TID   pantoprazole (PROTONIX) IV  40 mg Intravenous Q12H   sodium chloride flush  3 mL Intravenous Q12H   sodium chloride flush  3 mL Intravenous Q12H   Infusions:   sodium chloride 250 mL (04/28/22 0601)   heparin 1,300 Units/hr (04/28/22 1153)   piperacillin-tazobactam (ZOSYN)  IV 2.25 g (04/28/22 1602)   PRN: sodium chloride, acetaminophen **OR** acetaminophen, hydrALAZINE, HYDROmorphone (DILAUDID) injection, metoprolol tartrate, ondansetron **OR** ondansetron (ZOFRAN) IV, sodium chloride flush  Assessment: Anthony Hess is a 67 y.o. male presenting with perforated duodenal ulcer. PMH significant for CAD s/p CABG x 4 (2020), HFrEF (20-25%, g1DD, mod MR, mi-mod AR 2021), ESRD (TS HD), PAD, hx tobacco. Most recent acute care admission was  2/28 for excessive bleeding. New diagnosis of PE during that admission and discharged on Eliquis. Reversal with Kcentra and Vitamin K on 3/4 at prior to emergency repair surgery. Patient was on [facility] PTA per chart review. Last dose of apixaban was 3/4 at 2057. Pharmacy has been consulted to initiate and manage heparin infusion without boluses due to heightened risk of bleeding.   Baseline Labs: aPTT 32, HL <0.10, Hgb 11.5, Hct 36.3, Plt 283   Goal of Therapy:  Heparin level 0.3-0.7 units/ml aPTT 66-102 seconds Monitor platelets by anticoagulation protocol: Yes  Date Time aPTT/HL Rate/Comment  3/5 2303 ---/<0.10 800/subtherapeutic 3/6 0927 ---/<0.10 950/subtherapeutic 3/6 1902 55/0.17 1300/subtherapeutic  Plan:  No boluses due to  high risk of bleeding Increase heparin infusion to 1550 units/hr Check HL in 8 hours  Continue to monitor H&H and platelets daily while on heparin infusion   Gretel Acre, PharmD PGY1 Pharmacy Resident 04/28/2022 6:57 PM

## 2022-04-29 ENCOUNTER — Other Ambulatory Visit (HOSPITAL_COMMUNITY): Payer: Self-pay

## 2022-04-29 ENCOUNTER — Inpatient Hospital Stay: Payer: Medicare Other

## 2022-04-29 LAB — CBC
HCT: 35.2 % — ABNORMAL LOW (ref 39.0–52.0)
Hemoglobin: 10.9 g/dL — ABNORMAL LOW (ref 13.0–17.0)
MCH: 28.9 pg (ref 26.0–34.0)
MCHC: 31 g/dL (ref 30.0–36.0)
MCV: 93.4 fL (ref 80.0–100.0)
Platelets: 297 10*3/uL (ref 150–400)
RBC: 3.77 MIL/uL — ABNORMAL LOW (ref 4.22–5.81)
RDW: 17 % — ABNORMAL HIGH (ref 11.5–15.5)
WBC: 13.3 10*3/uL — ABNORMAL HIGH (ref 4.0–10.5)
nRBC: 0 % (ref 0.0–0.2)

## 2022-04-29 LAB — BASIC METABOLIC PANEL
Anion gap: 18 — ABNORMAL HIGH (ref 5–15)
BUN: 68 mg/dL — ABNORMAL HIGH (ref 8–23)
CO2: 24 mmol/L (ref 22–32)
Calcium: 7.2 mg/dL — ABNORMAL LOW (ref 8.9–10.3)
Chloride: 94 mmol/L — ABNORMAL LOW (ref 98–111)
Creatinine, Ser: 9 mg/dL — ABNORMAL HIGH (ref 0.61–1.24)
GFR, Estimated: 6 mL/min — ABNORMAL LOW (ref >60)
Glucose, Bld: 80 mg/dL (ref 70–99)
Potassium: 5.7 mmol/L — ABNORMAL HIGH (ref 3.5–5.1)
Sodium: 136 mmol/L (ref 135–145)

## 2022-04-29 LAB — PROTIME-INR
INR: 1.2 (ref 0.8–1.2)
Prothrombin Time: 14.8 seconds (ref 11.4–15.2)

## 2022-04-29 LAB — HEPARIN LEVEL (UNFRACTIONATED)
Heparin Unfractionated: 0.55 IU/mL (ref 0.30–0.70)
Heparin Unfractionated: 0.5 IU/mL (ref 0.30–0.70)

## 2022-04-29 LAB — MAGNESIUM: Magnesium: 2.3 mg/dL (ref 1.7–2.4)

## 2022-04-29 LAB — PROCALCITONIN: Procalcitonin: 6.87 ng/mL

## 2022-04-29 LAB — PHOSPHORUS: Phosphorus: 9.7 mg/dL — ABNORMAL HIGH (ref 2.5–4.6)

## 2022-04-29 MED ORDER — HYDROMORPHONE HCL 2 MG PO TABS
2.0000 mg | ORAL_TABLET | Freq: Four times a day (QID) | ORAL | Status: DC | PRN
  Administered 2022-04-29 – 2022-05-01 (×2): 2 mg via ORAL
  Filled 2022-04-29 (×3): qty 1

## 2022-04-29 MED ORDER — DIPHENHYDRAMINE HCL 25 MG PO CAPS
25.0000 mg | ORAL_CAPSULE | Freq: Once | ORAL | Status: AC
  Administered 2022-04-29: 25 mg via ORAL
  Filled 2022-04-29: qty 1

## 2022-04-29 MED ORDER — ACETAMINOPHEN 650 MG RE SUPP: 650.0000 mg | Freq: Four times a day (QID) | RECTAL | Status: DC | PRN

## 2022-04-29 MED ORDER — HYDROMORPHONE HCL 1 MG/ML IJ SOLN
0.5000 mg | INTRAMUSCULAR | Status: DC | PRN
  Administered 2022-04-30 (×5): 0.5 mg via INTRAVENOUS
  Filled 2022-04-29 (×5): qty 1

## 2022-04-29 MED ORDER — NICOTINE 14 MG/24HR TD PT24
14.0000 mg | MEDICATED_PATCH | Freq: Every day | TRANSDERMAL | Status: DC
  Administered 2022-04-29 – 2022-05-01 (×3): 14 mg via TRANSDERMAL
  Filled 2022-04-29 (×3): qty 1

## 2022-04-29 MED ORDER — NICOTINE 14 MG/24HR TD PT24: 14.0000 mg | MEDICATED_PATCH | Freq: Every day | TRANSDERMAL | Status: DC

## 2022-04-29 MED ORDER — ACETAMINOPHEN 325 MG PO TABS
650.0000 mg | ORAL_TABLET | Freq: Four times a day (QID) | ORAL | Status: DC | PRN
  Administered 2022-04-29: 650 mg via ORAL
  Filled 2022-04-29: qty 2

## 2022-04-29 MED ORDER — HEPARIN SODIUM (PORCINE) 1000 UNIT/ML IJ SOLN
INTRAMUSCULAR | Status: AC
  Filled 2022-04-29: qty 1

## 2022-04-29 MED ORDER — NICOTINE POLACRILEX 2 MG MT GUM
2.0000 mg | CHEWING_GUM | OROMUCOSAL | Status: DC | PRN
  Filled 2022-04-29: qty 1

## 2022-04-29 MED ORDER — IOHEXOL 9 MG/ML PO SOLN: 500.0000 mL | Freq: Once | ORAL | Status: DC | PRN

## 2022-04-29 NOTE — Progress Notes (Signed)
[facility] Progress Note  Patient: Anthony Hess    [id number]  DOA: 04/26/2022     Date of Service: the patient was seen and examined on 04/29/2022  Chief Complaint  Patient presents with   Abdominal Pain   Brief hospital course: Anthony Hess is a 67 y.o. male with PMH of HTN, HLD, CAD s/p CABG,  chronic systolic CHF, paroxysmal A-fib and PE (04/19/22) on DOAC, PVD, ESRD on HD TTS schedule, infrarenal abdominal aortic aneurysm without rupture, presented at [facility] ED with complaining of abdominal pain.  As per patient he was having abdominal pain for past few days, last night pain became worse and very sharp in the right side of abdomen.  Patient denied any nausea vomiting.   ED Course: Hypertensive, BP 206/132 highest,  Sodium 133, potassium 5.9 hyperkalemia, bicarb 17, metabolic acidosis, blood glucose 98, BUN 77 creatinine 9.9, calcium 7.4  CBC WBC 17.2, Hb 12.4.  COVID and flu negative CT A/P: Pneumoperitoneum and small ascites. Findings are concerning for bowel perforation. Surgical consult is advised.   General surgery and nephrology was consulted by ED physician, [facility] hospitalist team was consulted for admission and further management.   Assessment and Plan:  # Perforated duodenal ulcer  Patient presented with sudden onset of abdominal pain, CT scan showed pneumoperitoneum  Continue IV Zosyn, pharmacy was consulted. Continue pantoprazole 40 mg IV twice daily Continue as needed medication for pain control General surgery consulted, s/p duodenal perforation repair done on 3/4 3/6 Upper GI series: No extraluminal contrast to suggest a duodenal leak or perforation. If there is persistent clinical concern, recommend a CT of the abdomen with oral contrast only. 3/7 CT A/P with oral contrast negative for any contrast leak or perforation, persistent pneumoperitoneum and bilateral pleural effusion.  No any other significant findings. 3/7 started soft diet today, advance as per general  surgery    # Hyperkalemia due to ESRD, resolved after HD Patient was given IV calcium in the ED Insulin with dextrose was also given before surgery. Monitor potassium level     # ESRD on hemodialysis TTS schedule Metabolic acidosis due to ESRD, s/p bicarb IV infusion  Nephrology was consulted     # CAD s/p CABG, paroxysmal A-fib on DOAC, HTN, HLD, PVD, infrarenal abdominal aortic aneurysm without rupture Uncontrolled blood pressure could be due to pain and missed morning medications. Resumed statin, hydralazine and Imdur home dose Use IV Lopressor and IV hydralazine as needed We will continue monitor BP and titrate medication accordingly    # History of pulmonary embolism Patient was on Eliquis which was held due to surgery 3/5 started heparin IV infusion without bolus 3/7 d/w patient's wife who is stated patient could not tolerate Eliquis so we need to start any other anticoagulation. Patient is not a candidate for Xarelto due to ESRD Pharmacy consulted for Coumadin dosing and INR monitoring, continue bridge with heparin until INR is therapeutic.   Body mass index is 21.17 kg/m.  Interventions:    Diet: Soft diet  DVT Prophylaxis: Therapeutic Anticoagulation with heparin IV infusion    Advance goals of care discussion: Full code  Family Communication: family was not present at bedside, at the time of interview.  The pt provided permission to discuss medical plan with the family. Opportunity was given to ask question and all questions were answered satisfactorily.   Disposition:  Pt is from Home, admitted with duodenal ulcer perforation, started soft diet, still on IV Abx, and IV heparin, started Coumadin,  need to wait until INR is therapeutic, which precludes a safe discharge. Discharge to Home, when stable, may need few days more to improve.  Subjective: No significant events overnight, abdominal pain is improving, patient is passing gas and did move bowels.  Abdominal  pain is 4/10.  Patient denies any other active issues.   Physical Exam: General: NAD, lying comfortably Appear in no distress, affect appropriate Eyes: PERRLA ENT: Oral Mucosa Clear, moist  Neck: no JVD,  Cardiovascular: S1 and S2 Present, no Murmur,  Respiratory: good respiratory effort, Bilateral Air entry equal and Decreased, no Crackles, no wheezes Abdomen: Bowel Sound present, Soft and mild generalized tenderness, dressing CDI Skin: no rashes Extremities: no Pedal edema, no calf tenderness Neurologic: without any new focal findings Gait not checked due to patient safety concerns  Vitals:   04/29/22 1200 04/29/22 1230 04/29/22 1240 04/29/22 1302  BP: 100/65 100/70 (!) 104/91   Pulse: (!) 102 92 94   Resp: '15 14 14   '$ Temp:   98 F (36.7 C)   TempSrc:   Oral   SpO2: 93% 95% 95%   Weight:    52.2 kg    Intake/Output Summary (Last 24 hours) at 04/29/2022 1420 Last data filed at 04/29/2022 1240 Gross per 24 hour  Intake 1739.91 ml  Output 26.1 ml  Net 1713.81 ml   Filed Weights   04/27/22 1238 04/29/22 0845 04/29/22 1302  Weight: 54.2 kg 53.8 kg 52.2 kg    Data Reviewed: I have personally reviewed and interpreted daily labs, tele strips, imagings as discussed above. I reviewed all nursing notes, pharmacy notes, vitals, pertinent old records I have discussed plan of care as described above with RN and patient/family.  CBC: Recent Labs  Lab 04/26/22 1518 04/27/22 0535 04/28/22 0345 04/29/22 0407  WBC 17.2* 22.9* 13.6* 13.3*  HGB 12.4* 11.5* 10.5* 10.9*  HCT 40.9 36.3* 33.7* 35.2*  MCV 95.6 92.1 92.3 93.4  PLT 323 283 251 [id number]   Basic Metabolic Panel: Recent Labs  Lab 04/26/22 1518 04/26/22 1932 04/27/22 0535 04/28/22 0345 04/29/22 0407  NA 138  --  133* 136 136  K 5.9* 5.0 5.7* 5.0 5.7*  CL 102  --  94* 95* 94*  CO2 17*  --  '24 27 24  '$ GLUCOSE 98  --  181* 90 80  BUN 77*  --  84* 46* 68*  CREATININE 9.90*  --  10.42* 6.98* 9.00*  CALCIUM 7.4*  --  7.4*  7.2* 7.2*  MG  --   --  2.0 1.9 2.3  PHOS  --   --  10.1* 8.8* 9.7*    Studies: CT ABDOMEN WO CONTRAST  Result Date: 04/29/2022 CLINICAL DATA:  Status post surgical repair of perforated duodenal ulcer. EXAM: CT ABDOMEN WITHOUT CONTRAST TECHNIQUE: Multidetector CT imaging of the abdomen was performed following the standard protocol without IV contrast. RADIATION DOSE REDUCTION: This exam was performed according to the departmental dose-optimization program which includes automated exposure control, adjustment of the mA and/or kV according to patient size and/or use of iterative reconstruction technique. COMPARISON:  April 26, 2022. FINDINGS: Lower chest: Small bilateral pleural effusions are noted. Right lower lobe subsegmental atelectasis is noted. Hepatobiliary: Right hepatic cyst is noted. No cholelithiasis or biliary dilatation is noted. Pancreas: Unremarkable. No pancreatic ductal dilatation or surrounding inflammatory changes. Spleen: Normal in size without focal abnormality. Adrenals/Urinary Tract: Adrenal glands appear normal. Bilateral renal atrophy is noted consistent with end-stage renal disease. No hydronephrosis or renal obstruction  is noted. Stomach/Bowel: The stomach is unremarkable. Contrast is seen filling the stomach, proximal small bowel as well as the colon. No definite contrast extravasation is noted. Vascular/Lymphatic: Aortic atherosclerosis. No enlarged abdominal lymph nodes. Other: Surgical drain is seen in right upper quadrant. Mild amount of pneumoperitoneum is noted consistent with recent postoperative status. No definite abnormal fluid collection is noted within the visualized portion of the abdomen. Musculoskeletal: No acute or significant osseous findings. IMPRESSION: Status post surgical repair of perforated duodenal ulcer. No definite contrast extravasation is noted in the visualized portion of the abdomen currently. Surgical drain is noted in right upper quadrant. Mild  pneumoperitoneum is noted consistent with postoperative status. Small bilateral pleural effusions are noted. Probable right lower lobe subsegmental atelectasis is noted. Bilateral renal atrophy is noted consistent with end-stage renal disease. Aortic Atherosclerosis (ICD10-I70.0). Electronically Signed   By: Marijo Conception M.D.   On: 04/29/2022 09:01    Scheduled Meds:  atorvastatin  80 mg Oral QHS   calcitRIOL  0.5 mcg Oral Daily   Chlorhexidine Gluconate Cloth  6 each Topical Q0600   hydrALAZINE  25 mg Oral TID   isosorbide dinitrate  10 mg Oral TID   nicotine  14 mg Transdermal Daily   pantoprazole (PROTONIX) IV  40 mg Intravenous Q12H   sodium chloride flush  3 mL Intravenous Q12H   sodium chloride flush  3 mL Intravenous Q12H   Continuous Infusions:  sodium chloride 250 mL (04/28/22 0601)   heparin 1,550 Units/hr (04/29/22 0738)   piperacillin-tazobactam (ZOSYN)  IV Stopped (04/29/22 0204)   PRN Meds: sodium chloride, acetaminophen **OR** acetaminophen, hydrALAZINE, HYDROmorphone (DILAUDID) injection, HYDROmorphone, metoprolol tartrate, nicotine polacrilex, ondansetron **OR** ondansetron (ZOFRAN) IV, sodium chloride flush  Time spent: 55 minutes  Author: Val Riles. MD [facility] 04/29/2022 2:20 PM  To reach On-call, see care teams to locate the attending and reach out to them via [email or web address]. If 7PM-7AM, please contact night-coverage If you still have difficulty reaching the attending provider, please page the [facility] (Director on Call) for [facility] on amion for assistance.

## 2022-04-29 NOTE — Progress Notes (Signed)
[place] for Heparin Infusion Indication: pulmonary embolus  New PE (04/19/22) - started on Eliquis on 2/28, reversal with Kcentra and Vitamin K on 3/4 for emergency surgery  No Known Allergies  Patient Measurements: Weight: 54.2 kg (119 lb 7.8 oz) Heparin Dosing Weight: 54.2 kg  Vital Signs: Temp: 98.4 F (36.9 C) (03/07 0320) Temp Source: Oral (03/07 0320) BP: 152/107 (03/07 0358) Pulse Rate: 87 (03/07 0320)  Labs: Recent Labs    04/27/22 0535 04/27/22 1424 04/27/22 2303 04/28/22 0345 04/28/22 0927 04/28/22 1902 04/29/22 0407  HGB 11.5*  --   --  10.5*  --   --  10.9*  HCT 36.3*  --   --  33.7*  --   --  35.2*  PLT 283  --   --  251  --   --  297  APTT  --  32  --   --   --  55*  --   HEPARINUNFRC  --  <0.10*   < >  --  <0.10* 0.17* 0.55  CREATININE 10.42*  --   --  6.98*  --   --  9.00*   < > = values in this interval not displayed.    Estimated Creatinine Clearance: 6.4 mL/min (A) (by C-G formula based on SCr of 9 mg/dL (H)).  Medical History: Past Medical History:  Diagnosis Date   Aortic atherosclerosis ([place])    Bilateral carotid artery disease ([place])    Bladder cancer ([place])    Coronary artery disease 12/20/2018   a.) LHC 12/20/2018: 50% OM1, 40% OM2, 95% o-pLAD, 75% o=pLCx, 40% mLM, 70% D1, 60% mRCA-1, 50% mRCA-2; refer to CVTS. b.) 4v CABG at [facility] on 12/27/2018: LIMA-LAD, RIMA-PDA, seg LRA-OM1-D1   DCM (dilated cardiomyopathy) ([place]) 12/05/2018   a.) TTE 12/05/2018: EF 40-45%. b.) TTE 12/28/2019: EF 20-25%.   ESRD (end stage renal disease) ([place])    a.) T-Th-Sat   HFrEF (heart failure with reduced ejection fraction) ([place]) 12/05/2018   a.) TTE 12/05/2018: EF 40-45%; mild LVH; ant/apical/sep HK; mild TR . b.) TTE 12/28/2019: EF 20-25%; mod LVH; mod MR/AR; G1DD.   History of 2019 novel coronavirus disease (COVID-19) 04/08/2021   History of kidney stones    HLD (hyperlipidemia)    Hypertension    Infrarenal abdominal  aortic aneurysm (AAA) without rupture ([place]) 03/05/2021   a.) CT abd/pelvis; measured 3.2 cm.   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   a.) LIMA-LAD, RIMA-PDA, sequential LEFT radial artery to OM1 and D1   Sepsis ([place]) 03/14/2021   Wears glasses    Medications:  Scheduled:   atorvastatin  80 mg Oral QHS   calcitRIOL  0.5 mcg Oral Daily   Chlorhexidine Gluconate Cloth  6 each Topical Q0600   hydrALAZINE  25 mg Oral TID   isosorbide dinitrate  10 mg Oral TID   pantoprazole (PROTONIX) IV  40 mg Intravenous Q12H   sodium chloride flush  3 mL Intravenous Q12H   sodium chloride flush  3 mL Intravenous Q12H   Infusions:   sodium chloride 250 mL (04/28/22 0601)   heparin 1,550 Units/hr (04/28/22 2003)   piperacillin-tazobactam (ZOSYN)  IV Stopped (04/29/22 0204)   PRN: sodium chloride, acetaminophen **OR** acetaminophen, hydrALAZINE, HYDROmorphone (DILAUDID) injection, metoprolol tartrate, ondansetron **OR** ondansetron (ZOFRAN) IV, sodium chloride flush  Assessment: Ovid Terry is a 67 y.o. male presenting with perforated duodenal ulcer. PMH significant  for CAD s/p CABG x 4 (2020), HFrEF (20-25%, g1DD, mod MR, mi-mod AR 2021), ESRD (TS HD), PAD, hx tobacco. Most recent acute care admission was 2/28 for excessive bleeding. New diagnosis of PE during that admission and discharged on Eliquis. Reversal with Kcentra and Vitamin K on 3/4 at prior to emergency repair surgery. Patient was on [facility] PTA per chart review. Last dose of apixaban was 3/4 at 2057. Pharmacy has been consulted to initiate and manage heparin infusion without boluses due to heightened risk of bleeding.   Baseline Labs: aPTT 32, HL <0.10, Hgb 11.5, Hct 36.3, Plt 283   Goal of Therapy:  Heparin level 0.3-0.7 units/ml aPTT 66-102 seconds Monitor platelets by anticoagulation protocol: Yes  Date Time aPTT/HL Rate/Comment   3/5 2303 ---/<0.10 800/subtherapeutic 3/6 0927 ---/<0.10 950/subtherapeutic 3/6 1902 55/0.17 1300/subtherapeutic 3/7 0407 HL 0.55 Therapeutic x 1  Plan:  No boluses due to high risk of bleeding Continue heparin infusion at 1550 units/hr Recheck HL in 8 hours to confirm  Continue to monitor H&H and platelets daily while on heparin infusion   Renda Rolls, PharmD, [facility] 04/29/2022 4:51 AM

## 2022-04-29 NOTE — Progress Notes (Addendum)
Pt was trying to leave hospital when asked by the staff " Pt states I want to go out and smoke" Per pt smoke half a pack per day of cigarette. Pt also complained of itching. NP Foust made aware. Will continue to monitor.  Update 0626: NP Foust placed order. Will continue to monitor.

## 2022-04-29 NOTE — Plan of Care (Signed)

## 2022-04-29 NOTE — Progress Notes (Signed)
[facility]  ROUNDING NOTE   Subjective:   Anthony Hess is a 67 year old male with past medical conditions including hypertension, PVD, dyslipidemia, CAD, four-vessel CABG, and end-stage renal disease on hemodialysis.  Patient presents to the emergency department with complaints of abdominal painand has been admitted for Pneumoperitoneum [K66.8] Hyperkalemia [E87.5] Perforation bowel ([place]) [K63.1] Abdominal pain, unspecified abdominal location [R10.9]  Patient is known to our practice from previous admissions and currently receives outpatient dialysis treatments at [facility] on a TTS schedule, supervised by Dr. Holley Raring.    Patient seen and evaluated during dialysis   HEMODIALYSIS FLOWSHEET:  Blood Flow Rate (mL/min): 300 mL/min (Decreased BFR to 300 due to multiple high AP alarms) Arterial Pressure (mmHg): -280 mmHg (Simultaneous filing. User may not have seen previous data.) Venous Pressure (mmHg): 120 mmHg (Simultaneous filing. User may not have seen previous data.) TMP (mmHg): 22 mmHg (Simultaneous filing. User may not have seen previous data.) Ultrafiltration Rate (mL/min): 686 mL/min (Simultaneous filing. User may not have seen previous data.) Dialysate Flow Rate (mL/min): 300 ml/min (Simultaneous filing. User may not have seen previous data.)  No complaints at this time.  Objective:  Vital signs in last 24 hours:  Temp:  [97.5 F (36.4 C)-98.4 F (36.9 C)] 98.1 F (36.7 C) (03/07 0854) Pulse Rate:  [75-97] 97 (03/07 1000) Resp:  [12-20] 18 (03/07 1000) BP: (128-179)/(81-132) 128/92 (03/07 1000) SpO2:  [84 %-97 %] 93 % (03/07 1000) Weight:  [53.8 kg] 53.8 kg (03/07 0845)  Weight change:  Filed Weights   04/27/22 0841 04/27/22 1238 04/29/22 0845  Weight: 56.2 kg 54.2 kg 53.8 kg    Intake/Output: I/O last 3 completed shifts: In: 1913.6 [P.O.:1440; I.V.:323.6; IV Piggyback:150] Out: 285 [Emesis/NG output:150; Drains:135]   Intake/Output this  shift:  Total I/O In: 115.8 [I.V.:115.8] Out: -   Physical Exam: General: NAD  Head: Normocephalic, atraumatic. Moist oral mucosal membranes  Eyes: Anicteric  Lungs:  Clear to auscultation, normal effort  Heart: Regular rate and rhythm  Abdomen:  Soft, tender, mild distention  Extremities: No peripheral edema.  Neurologic: Somnolent, moving all four extremities  Skin: No lesions, surgical dressing-old drainage  Access: Right PermCath    Basic Metabolic Panel: Recent Labs  Lab 04/26/22 1518 04/26/22 1932 04/27/22 0535 04/28/22 0345 04/29/22 0407  NA 138  --  133* 136 136  K 5.9* 5.0 5.7* 5.0 5.7*  CL 102  --  94* 95* 94*  CO2 17*  --  '24 27 24  '$ GLUCOSE 98  --  181* 90 80  BUN 77*  --  84* 46* 68*  CREATININE 9.90*  --  10.42* 6.98* 9.00*  CALCIUM 7.4*  --  7.4* 7.2* 7.2*  MG  --   --  2.0 1.9 2.3  PHOS  --   --  10.1* 8.8* 9.7*     Liver Function Tests: Recent Labs  Lab 04/26/22 1518  AST 15  ALT 15  ALKPHOS 52  BILITOT 0.5  PROT 7.2  ALBUMIN 3.8    Recent Labs  Lab 04/26/22 1518  LIPASE 38    No results for input(s): "AMMONIA" in the last 168 hours.  CBC: Recent Labs  Lab 04/26/22 1518 04/27/22 0535 04/28/22 0345 04/29/22 0407  WBC 17.2* 22.9* 13.6* 13.3*  HGB 12.4* 11.5* 10.5* 10.9*  HCT 40.9 36.3* 33.7* 35.2*  MCV 95.6 92.1 92.3 93.4  PLT 323 283 251 297     Cardiac Enzymes: No results for input(s): "CKTOTAL", "CKMB", "CKMBINDEX", "TROPONINI"  in the last 168 hours.  BNP: Invalid input(s): "POCBNP"  CBG: No results for input(s): "GLUCAP" in the last 168 hours.  Microbiology: Results for orders placed or performed during the hospital encounter of 04/26/22  Resp panel by RT-PCR (RSV, Flu A&B, Covid) Anterior Nasal Swab     Status: None   Collection Time: 04/26/22  6:43 PM   Specimen: Anterior Nasal Swab  Result Value Ref Range Status   SARS Coronavirus 2 by RT PCR NEGATIVE NEGATIVE Final    Comment: (NOTE) SARS-CoV-2 target  nucleic acids are NOT DETECTED.  The SARS-CoV-2 RNA is generally detectable in upper respiratory specimens during the acute phase of infection. The lowest concentration of SARS-CoV-2 viral copies this assay can detect is 138 copies/mL. A negative result does not preclude SARS-Cov-2 infection and should not be used as the sole basis for treatment or other patient management decisions. A negative result may occur with  improper specimen collection/handling, submission of specimen other than nasopharyngeal swab, presence of viral mutation(s) within the areas targeted by this assay, and inadequate number of viral copies(<138 copies/mL). A negative result must be combined with clinical observations, patient history, and epidemiological information. The expected result is Negative.  Fact Sheet for Patients:  EntrepreneurPulse.com.au  Fact Sheet for Healthcare Providers:  IncredibleEmployment.be  This test is no t yet approved or cleared by the [place] FDA and  has been authorized for detection and/or diagnosis of SARS-CoV-2 by FDA under an Emergency Use Authorization (EUA). This EUA will remain  in effect (meaning this test can be used) for the duration of the COVID-19 declaration under Section 564(b)(1) of the Act, 21 U.S.C.section 360bbb-3(b)(1), unless the authorization is terminated  or revoked sooner.       Influenza A by PCR NEGATIVE NEGATIVE Final   Influenza B by PCR NEGATIVE NEGATIVE Final    Comment: (NOTE) The Xpert Xpress SARS-CoV-2/FLU/RSV plus assay is intended as an aid in the diagnosis of influenza from Nasopharyngeal swab specimens and should not be used as a sole basis for treatment. Nasal washings and aspirates are unacceptable for Xpert Xpress SARS-CoV-2/FLU/RSV testing.  Fact Sheet for Patients: EntrepreneurPulse.com.au  Fact Sheet for Healthcare  Providers: IncredibleEmployment.be  This test is not yet approved or cleared by the [place] FDA and has been authorized for detection and/or diagnosis of SARS-CoV-2 by FDA under an Emergency Use Authorization (EUA). This EUA will remain in effect (meaning this test can be used) for the duration of the COVID-19 declaration under Section 564(b)(1) of the Act, 21 U.S.C. section 360bbb-3(b)(1), unless the authorization is terminated or revoked.     Resp Syncytial Virus by PCR NEGATIVE NEGATIVE Final    Comment: (NOTE) Fact Sheet for Patients: EntrepreneurPulse.com.au  Fact Sheet for Healthcare Providers: IncredibleEmployment.be  This test is not yet approved or cleared by the [place] FDA and has been authorized for detection and/or diagnosis of SARS-CoV-2 by FDA under an Emergency Use Authorization (EUA). This EUA will remain in effect (meaning this test can be used) for the duration of the COVID-19 declaration under Section 564(b)(1) of the Act, 21 U.S.C. section 360bbb-3(b)(1), unless the authorization is terminated or revoked.  Performed at [facility], [address] [place], [place] [address]     Coagulation Studies: No results for input(s): "LABPROT", "INR" in the last 72 hours.   Urinalysis: No results for input(s): "COLORURINE", "LABSPEC", "PHURINE", "GLUCOSEU", "HGBUR", "BILIRUBINUR", "KETONESUR", "PROTEINUR", "UROBILINOGEN", "NITRITE", "LEUKOCYTESUR" in the last 72 hours.  Invalid input(s): "APPERANCEUR"  Imaging: CT ABDOMEN WO CONTRAST  Result Date: 04/29/2022 CLINICAL DATA:  Status post surgical repair of perforated duodenal ulcer. EXAM: CT ABDOMEN WITHOUT CONTRAST TECHNIQUE: Multidetector CT imaging of the abdomen was performed following the standard protocol without IV contrast. RADIATION DOSE REDUCTION: This exam was performed according to the departmental dose-optimization program  which includes automated exposure control, adjustment of the mA and/or kV according to patient size and/or use of iterative reconstruction technique. COMPARISON:  April 26, 2022. FINDINGS: Lower chest: Small bilateral pleural effusions are noted. Right lower lobe subsegmental atelectasis is noted. Hepatobiliary: Right hepatic cyst is noted. No cholelithiasis or biliary dilatation is noted. Pancreas: Unremarkable. No pancreatic ductal dilatation or surrounding inflammatory changes. Spleen: Normal in size without focal abnormality. Adrenals/Urinary Tract: Adrenal glands appear normal. Bilateral renal atrophy is noted consistent with end-stage renal disease. No hydronephrosis or renal obstruction is noted. Stomach/Bowel: The stomach is unremarkable. Contrast is seen filling the stomach, proximal small bowel as well as the colon. No definite contrast extravasation is noted. Vascular/Lymphatic: Aortic atherosclerosis. No enlarged abdominal lymph nodes. Other: Surgical drain is seen in right upper quadrant. Mild amount of pneumoperitoneum is noted consistent with recent postoperative status. No definite abnormal fluid collection is noted within the visualized portion of the abdomen. Musculoskeletal: No acute or significant osseous findings. IMPRESSION: Status post surgical repair of perforated duodenal ulcer. No definite contrast extravasation is noted in the visualized portion of the abdomen currently. Surgical drain is noted in right upper quadrant. Mild pneumoperitoneum is noted consistent with postoperative status. Small bilateral pleural effusions are noted. Probable right lower lobe subsegmental atelectasis is noted. Bilateral renal atrophy is noted consistent with end-stage renal disease. Aortic Atherosclerosis (ICD10-I70.0). Electronically Signed   By: Marijo Conception M.D.   On: 04/29/2022 09:01   DG UGI W SINGLE CM (SOL OR THIN BA)  Result Date: 04/28/2022 CLINICAL DATA:  Patient is 2 days status post duodenal  ulcer repair. EXAM: DG UGI W SINGLE CM TECHNIQUE: Single contrast examination was then performed using thin liquid barium. This exam was performed by Reatha Armour, PA-C , and was supervised and interpreted by Dr Kathreen Devoid. FLUOROSCOPY: Radiation Exposure Index (as provided by the fluoroscopic device): 10.20 mGy Kerma COMPARISON:  None Available. FINDINGS: Esophagus:  Normal appearance. Esophageal motility:  Within normal limits. Gastroesophageal reflux:  None visualized. Ingested 31m barium tablet: Not given Stomach: Normal appearance. No hiatal hernia. Gastric emptying: Normal. Duodenum: Normal appearance. No contrast extravasation visualized on today's exam. Other:  None. IMPRESSION: Limited upper GI study conducted secondary to limited mobility and limited ability to tolerate contrast material. No extraluminal contrast to suggest a duodenal leak or perforation. If there is persistent clinical concern, recommend a CT of the abdomen with oral contrast only. Electronically Signed   By: HKathreen DevoidM.D.   On: 04/28/2022 11:05     Medications:    sodium chloride 250 mL (04/28/22 0601)   heparin 1,550 Units/hr (04/29/22 0738)   piperacillin-tazobactam (ZOSYN)  IV Stopped (04/29/22 0204)     atorvastatin  80 mg Oral QHS   calcitRIOL  0.5 mcg Oral Daily   Chlorhexidine Gluconate Cloth  6 each Topical Q0600   hydrALAZINE  25 mg Oral TID   isosorbide dinitrate  10 mg Oral TID   nicotine  14 mg Transdermal Daily   pantoprazole (PROTONIX) IV  40 mg Intravenous Q12H   sodium chloride flush  3 mL Intravenous Q12H   sodium chloride flush  3 mL Intravenous Q12H  sodium chloride, acetaminophen **OR** acetaminophen, hydrALAZINE, HYDROmorphone (DILAUDID) injection, metoprolol tartrate, nicotine polacrilex, ondansetron **OR** ondansetron (ZOFRAN) IV, sodium chloride flush  Assessment/ Plan:  [name] is a 67 y.o.  male  Anthony Hess is a 67 year old male with past medical conditions including  hypertension, PVD, dyslipidemia, CAD, four-vessel CABG, and end-stage renal disease on hemodialysis.  Patient presents to the emergency department with complaints of shortness of breath and has been admitted for Pneumoperitoneum [K66.8] Hyperkalemia [E87.5] Perforation bowel ([place]) [K63.1] Abdominal pain, unspecified abdominal location [R10.9]  [facility]/TTS/right PermCath.  End-stage renal disease with hyperkalemia on hemodialysis.    Receiving dialysis today, UF goal 1-1.5L as tolerated. Next treatment scheduled for Saturday.    2. Anemia of chronic kidney disease Lab Results  Component Value Date   HGB 10.9 (L) 04/29/2022    Patient receives Mircera at outpatient clinic. Hgb stable  3. Secondary Hyperparathyroidism: with outpatient labs: PTH 532, phosphorus 10.7, calcium 6.1 on 02/11/22.  :  Lab Results  Component Value Date   PTH 159 (H) 12/27/2019   CALCIUM 7.2 (L) 04/29/2022   CAION 0.84 (LL) 09/11/2021   PHOS 9.7 (H) 04/29/2022    Hypocalcemia/hyperphosphatemia-continue daily calcitriol.  Prescribed calcium acetate and ergocalciferol outpatient.    4.  Hypertension with chronic kidney disease.  Home regimen includes amlodipine, clonidine, metoprolol, Entresto and losartan.  Currently receiving hydralazine, isosorbide, metoprolol, and Entresto.  Blood pressure elevated at 143/108, may be due to pain.  5.  Bowel perforation, surgery consulted and performed bowel repair with Phillip Heal patch on 04/26/2022.  GI imaging series completed this morning, findings acceptable. Tolerating clear liquid diet.    LOS: 3 Khyren Hing 3/7/202410:16 AM

## 2022-04-29 NOTE — Plan of Care (Signed)

## 2022-04-29 NOTE — TOC Progression Note (Signed)
Transition of Care [facility]) - Progression Note    Patient Details  Name: Jeran Lepage MRN: [id number] Date of Birth: 07/21/1955  [facility] CM/SW Contact  Laurena Slimmer, RN Phone Number: 04/29/2022, 12:54 PM  Clinical Narrative:    Case reviewed for DME needs and changes in discharge disposition.     Expected Discharge Plan: Home/Self Care Barriers to Discharge: Continued Medical Work up  Expected Discharge Plan and Services                                               Social Determinants of Health (SDOH) Interventions SDOH Screenings   Food Insecurity: No Food Insecurity (04/27/2022)  Housing: Low Risk  (04/27/2022)  Transportation Needs: No Transportation Needs (04/27/2022)  Utilities: Not At Risk (04/27/2022)  Depression (PHQ2-9): Low Risk  (01/11/2020)  Tobacco Use: Medium Risk (04/27/2022)    Readmission Risk Interventions    04/28/2022    2:26 PM 11/18/2021    2:04 PM 04/09/2021    1:32 PM  Readmission Risk Prevention Plan  Transportation Screening Complete Complete Complete  Palliative Care Screening   Not Applicable  Medication Review ([place]) Complete Complete Complete  PCP or Specialist appointment within 3-5 days of discharge Complete Complete   HRI or [place] Complete    SW Recovery Care/Counseling Consult Complete Complete   Palliative Care Screening Not Applicable Not [facility] Not Applicable Not Applicable

## 2022-04-29 NOTE — Progress Notes (Signed)
       CROSS COVER NOTE  NAME: Anthony Hess MRN: [id number] DOB : 1956/02/11 ATTENDING PHYSICIAN: Val Riles, MD    Date of Service   04/29/2022   HPI/Events of Note   Medication request received for nicotine replacement therapy and patient report of itching.   Interventions   Assessment/Plan:  Nicotine Dependence NRT with Nicotine patch and Nicorette gum   Benadryl x1       To reach the provider On-Call:   7AM- 7PM see care teams to locate the attending and reach out to them via [email or web address] Password: [email or web address] 7PM-7AM contact night-coverage If you still have difficulty reaching the appropriate provider, please page the [facility] (Director on Call) for [facility] on amion for assistance  This document was prepared using Systems analyst and may include unintentional dictation errors.  Neomia Glass DNP, MBA, FNP-BC, PMHNP-BC Nurse Practitioner [facility] Pager [phone]

## 2022-04-29 NOTE — Consult Note (Signed)
ANTICOAGULATION CONSULT NOTE - Initial Consult  Pharmacy Consult for warfarin with a heparin bridge  Indication: pulmonary embolus   No Known Allergies  Patient Measurements: Weight: 52.2 kg (115 lb 1.3 oz) Heparin Dosing Weight: 54.2 kg   Vital Signs: Temp: 98 F (36.7 C) (03/07 1240) Temp Source: Oral (03/07 1240) BP: 104/91 (03/07 1240) Pulse Rate: 94 (03/07 1240)  Labs: Recent Labs    04/27/22 0535 04/27/22 1424 04/27/22 2303 04/28/22 0345 04/28/22 0927 04/28/22 1902 04/29/22 0407  HGB 11.5*  --   --  10.5*  --   --  10.9*  HCT 36.3*  --   --  33.7*  --   --  35.2*  PLT 283  --   --  251  --   --  297  APTT  --  32  --   --   --  55*  --   HEPARINUNFRC  --  <0.10*   < >  --  <0.10* 0.17* 0.55  CREATININE 10.42*  --   --  6.98*  --   --  9.00*   < > = values in this interval not displayed.    Estimated Creatinine Clearance: 6.1 mL/min (A) (by C-G formula based on SCr of 9 mg/dL (H)).   Medical History: Past Medical History:  Diagnosis Date   Aortic atherosclerosis ([place])    Bilateral carotid artery disease ([place])    Bladder cancer ([place])    Coronary artery disease 12/20/2018   a.) LHC 12/20/2018: 50% OM1, 40% OM2, 95% o-pLAD, 75% o=pLCx, 40% mLM, 70% D1, 60% mRCA-1, 50% mRCA-2; refer to CVTS. b.) 4v CABG at [facility] on 12/27/2018: LIMA-LAD, RIMA-PDA, seg LRA-OM1-D1   DCM (dilated cardiomyopathy) ([place]) 12/05/2018   a.) TTE 12/05/2018: EF 40-45%. b.) TTE 12/28/2019: EF 20-25%.   ESRD (end stage renal disease) ([place])    a.) T-Th-Sat   HFrEF (heart failure with reduced ejection fraction) ([place]) 12/05/2018   a.) TTE 12/05/2018: EF 40-45%; mild LVH; ant/apical/sep HK; mild TR . b.) TTE 12/28/2019: EF 20-25%; mod LVH; mod MR/AR; G1DD.   History of 2019 novel coronavirus disease (COVID-19) 04/08/2021   History of kidney stones    HLD (hyperlipidemia)    Hypertension    Infrarenal abdominal aortic aneurysm (AAA) without rupture ([place]) 03/05/2021   a.) CT abd/pelvis; measured  3.2 cm.   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 30 tablet 0 prn at unk   apixaban (ELIQUIS) 5 MG TABS tablet Take 1 tablet (5 mg total) by mouth 2 (two) times daily. 60 tablet 0 04/26/2022   aspirin EC 81 MG tablet Take 1 tablet (81 mg total) by mouth daily. Swallow whole. 30 tablet 0 04/26/2022   atorvastatin (LIPITOR) 80 MG tablet Take 1 tablet (80 mg total) by mouth daily.   04/25/2022   calcitRIOL (ROCALTROL) 0.5 MCG capsule Take 1 capsule (0.5 mcg total) by mouth daily. 30 capsule 0 04/25/2022   calcium acetate (PHOSLO) 667 MG capsule Take 1 capsule (667 mg total) by mouth  3 (three) times daily with meals. 30 capsule 0 04/25/2022   hydrALAZINE (APRESOLINE) 25 MG tablet Take 1 tablet (25 mg total) by mouth 3 (three) times daily. 90 tablet 0 04/25/2022   isosorbide dinitrate (ISORDIL) 10 MG tablet Take 1 tablet (10 mg total) by mouth 3 (three) times daily. 30 tablet 0 04/25/2022   metoprolol succinate (TOPROL-XL) 25 MG 24 hr tablet Take 25 mg by mouth daily.   04/25/2022   multivitamin (RENA-VIT) TABS tablet Take 1 tablet by mouth at bedtime. 30 tablet 1 Past Week   sacubitril-valsartan (ENTRESTO) 24-26 MG Take 1 tablet by mouth 2 (two) times daily.   04/26/2022   sucroferric oxyhydroxide (VELPHORO) 500 MG chewable tablet Chew 1 tablet (500 mg total) by mouth 3 (three) times daily with meals. 90 tablet 0 04/25/2022   Vitamin D, Ergocalciferol, (DRISDOL) 1.25 MG (50000 UNIT) CAPS capsule Take 1 capsule (50,000 Units total) by mouth every 7 (seven) days. 5 capsule 0 Past Week   apixaban  (ELIQUIS) 5 MG TABS tablet Take 2 tablets (10 mg total) by mouth 2 (two) times daily for 7 days. (Patient not taking: Reported on 04/26/2022) 28 tablet 0 Not Taking   Scheduled:   atorvastatin  80 mg Oral QHS   calcitRIOL  0.5 mcg Oral Daily   Chlorhexidine Gluconate Cloth  6 each Topical Q0600   hydrALAZINE  25 mg Oral TID   isosorbide dinitrate  10 mg Oral TID   nicotine  14 mg Transdermal Daily   pantoprazole (PROTONIX) IV  40 mg Intravenous Q12H   sodium chloride flush  3 mL Intravenous Q12H   sodium chloride flush  3 mL Intravenous Q12H   Infusions:   sodium chloride 250 mL (04/28/22 0601)   heparin 1,550 Units/hr (04/29/22 0738)   piperacillin-tazobactam (ZOSYN)  IV Stopped (04/29/22 0204)   PRN: sodium chloride, acetaminophen **OR** acetaminophen, hydrALAZINE, HYDROmorphone (DILAUDID) injection, HYDROmorphone, metoprolol tartrate, nicotine polacrilex, ondansetron **OR** ondansetron (ZOFRAN) IV, sodium chloride flush Anti-infectives (From admission, onward)    Start     Dose/Rate Route Frequency Ordered Stop   04/27/22 0100  piperacillin-tazobactam (ZOSYN) IVPB 2.25 g        2.25 g 100 mL/hr over 30 Minutes Intravenous Every 8 hours 04/26/22 1754     04/26/22 1700  piperacillin-tazobactam (ZOSYN) IVPB 3.375 g        3.375 g 100 mL/hr over 30 Minutes Intravenous  Once 04/26/22 1658 04/26/22 1738       Assessment: Anthony Hess is a 67 YO male admitted for perforated duodenal ulcer. PMH includes CAD s/p CABG x 4 (2020), HFrEF (20-25%, g1DD, mod MR, mi-mod AR 2021), ESRD (TS HD), PAD, hx tobacco. Developed new PE on 2/26 and discharged on Eliquis. Reversed with Kcentra 3/4 prior to emergency repair surgery. Last dose of apixaban was 3/4 AM. Per discussion with MD, patient does not tolerate apixaban and states it is too expensive (copay is $11). Patient cannot use enoxaparin, or any other DOAC due to renal function. Pharmacy has been consulted to initiate warfarin dosing. Patient has  a low body weight, which can cause increase sensitivity to warfarin and is currently on pip/tazo which can increase INR. CBC stable.    3/7 INR 1.2  3/7 0407 Hl 0.55 3/7 1405 HL 0.5   Goal of Therapy:  INR 2-3 Heparin level 0.3-0.7 units/ml Monitor platelets by anticoagulation protocol: Yes   Plan:  Heparin Heparin level is therapeutic. Will continue heparin infusion at 1550 units/hr. Recheck heparin level  and CBC with AM labs.  Warfarin INR is subtherapeutic. New start warfarin. Will give warfarin 5 mg x 1 tonight. Daily INR. CBC daily while on heparin. Continue heparin bridge until INR > 2 for 24 hours and minimum of 5 days.      Darrall Dears PharmD Candidate Class of 2026  04/29/2022,1:44 PM

## 2022-04-29 NOTE — Progress Notes (Signed)
  Received patient in bed to unit.   Informed consent signed and in chart.    TX duration:3.5hrs     Transported back to unit Hand-off given to patient's nurse.    Access used: R HD catheter Access issues: none   Total UF removed: 1.1kg Medication(s) given: '1mg'$  Dilaudid  Post HD VS: 125/66 84 Post HD weight: 52.2kg     Darrol Jump LPN [facility]

## 2022-04-29 NOTE — Care Management Important Message (Signed)
Important Message  Patient Details  Name: Mathis Neault MRN: [id number] Date of Birth: 05/04/1955   Medicare Important Message Given:  Yes     Dannette Barbara 04/29/2022, 3:35 PM

## 2022-04-29 NOTE — Progress Notes (Signed)
Patient ID: Anthony Hess, male   DOB: 08-13-1955, 66 y.o.   MRN: [id number]     [facility] Day(s): 3.   Interval History: Patient seen and examined, no acute events or new complaints overnight. Patient reports feeling well this morning.  He denies abdominal pain.  He denies any nausea or vomiting.  He tried to liquid diet.  Vital signs in last 24 hours: [min-max] current  Temp:  [97.5 F (36.4 C)-98.4 F (36.9 C)] 98.1 F (36.7 C) (03/07 0854) Pulse Rate:  [75-97] 93 (03/07 1100) Resp:  [11-20] 15 (03/07 1100) BP: (128-179)/(81-132) 153/111 (03/07 1100) SpO2:  [84 %-97 %] 95 % (03/07 1100) Weight:  [53.8 kg] 53.8 kg (03/07 0845)       Weight: 53.8 kg BMI (Calculated): 21.02   Physical Exam:  Constitutional: alert, cooperative and no distress  Respiratory: breathing non-labored at rest  Cardiovascular: regular rate and sinus rhythm  Gastrointestinal: soft, non-tender, and non-distended.  Drain serosanguineous.  Labs:     Latest Ref Rng & Units 04/29/2022    4:07 AM 04/28/2022    3:45 AM 04/27/2022    5:35 AM  CBC  WBC 4.0 - 10.5 K/uL 13.3  13.6  22.9   Hemoglobin 13.0 - 17.0 g/dL 10.9  10.5  11.5   Hematocrit 39.0 - 52.0 % 35.2  33.7  36.3   Platelets 150 - 400 K/uL 297  251  283       Latest Ref Rng & Units 04/29/2022    4:07 AM 04/28/2022    3:45 AM 04/27/2022    5:35 AM  CMP  Glucose 70 - 99 mg/dL 80  90  181   BUN 8 - 23 mg/dL 68  46  84   Creatinine 0.61 - 1.24 mg/dL 9.00  6.98  10.42   Sodium 135 - 145 mmol/L 136  136  133   Potassium 3.5 - 5.1 mmol/L 5.7  5.0  5.7   Chloride 98 - 111 mmol/L 94  95  94   CO2 22 - 32 mmol/L '24  27  24   '$ Calcium 8.9 - 10.3 mg/dL 7.2  7.2  7.4     Imaging studies: CT of abdomen without any sign of contrast rotation.   Assessment/Plan:  67 y.o. male with perforated duodenal ulcer 3 Day Post-Op s/p duodenal ulcer repair, complicated by pertinent comorbidities including recent non-STEMI, recent PE on Eliquis, end-stage  renal disease on hemodialysis.   -Patient doing well from surgical standpoint.  CT scan of the abdomen with oral contrast today shows no contrast rotation.  I will advance diet to soft diet. -If patient tolerates soft diet today he will be cleared to be discharged from my standpoint.  I recommend to keep patient with high-dose PPIs due to gastric ulcer perforation.     Arnold Long, MD

## 2022-04-30 DIAGNOSIS — K631 Perforation of intestine (nontraumatic): Secondary | ICD-10-CM | POA: Diagnosis not present

## 2022-04-30 LAB — COMPREHENSIVE METABOLIC PANEL
ALT: <5 U/L (ref 0–44)
AST: 14 U/L — ABNORMAL LOW (ref 15–41)
Albumin: 2.6 g/dL — ABNORMAL LOW (ref 3.5–5.0)
Alkaline Phosphatase: 46 U/L (ref 38–126)
Anion gap: 16 — ABNORMAL HIGH (ref 5–15)
BUN: 44 mg/dL — ABNORMAL HIGH (ref 8–23)
CO2: 24 mmol/L (ref 22–32)
Calcium: 7.8 mg/dL — ABNORMAL LOW (ref 8.9–10.3)
Chloride: 95 mmol/L — ABNORMAL LOW (ref 98–111)
Creatinine, Ser: 7.08 mg/dL — ABNORMAL HIGH (ref 0.61–1.24)
GFR, Estimated: 8 mL/min — ABNORMAL LOW (ref >60)
Glucose, Bld: 92 mg/dL (ref 70–99)
Potassium: 4.2 mmol/L (ref 3.5–5.1)
Sodium: 135 mmol/L (ref 135–145)
Total Bilirubin: 0.8 mg/dL (ref 0.3–1.2)
Total Protein: 6.2 g/dL — ABNORMAL LOW (ref 6.5–8.1)

## 2022-04-30 LAB — CBC
HCT: 32.7 % — ABNORMAL LOW (ref 39.0–52.0)
Hemoglobin: 10.2 g/dL — ABNORMAL LOW (ref 13.0–17.0)
MCH: 28.6 pg (ref 26.0–34.0)
MCHC: 31.2 g/dL (ref 30.0–36.0)
MCV: 91.6 fL (ref 80.0–100.0)
Platelets: 324 10*3/uL (ref 150–400)
RBC: 3.57 MIL/uL — ABNORMAL LOW (ref 4.22–5.81)
RDW: 16.5 % — ABNORMAL HIGH (ref 11.5–15.5)
WBC: 9.6 10*3/uL (ref 4.0–10.5)
nRBC: 0 % (ref 0.0–0.2)

## 2022-04-30 LAB — PHOSPHORUS: Phosphorus: 7.3 mg/dL — ABNORMAL HIGH (ref 2.5–4.6)

## 2022-04-30 LAB — MAGNESIUM: Magnesium: 2.1 mg/dL (ref 1.7–2.4)

## 2022-04-30 MED ORDER — BUDESONIDE 0.5 MG/2ML IN SUSP
0.5000 mg | Freq: Two times a day (BID) | RESPIRATORY_TRACT | Status: DC
  Administered 2022-04-30 – 2022-05-01 (×3): 0.5 mg via RESPIRATORY_TRACT
  Filled 2022-04-30 (×3): qty 2

## 2022-04-30 MED ORDER — IPRATROPIUM-ALBUTEROL 0.5-2.5 (3) MG/3ML IN SOLN
3.0000 mL | Freq: Four times a day (QID) | RESPIRATORY_TRACT | Status: DC
  Administered 2022-04-30 (×2): 3 mL via RESPIRATORY_TRACT
  Filled 2022-04-30 (×2): qty 3

## 2022-04-30 MED ORDER — IPRATROPIUM-ALBUTEROL 0.5-2.5 (3) MG/3ML IN SOLN
3.0000 mL | Freq: Three times a day (TID) | RESPIRATORY_TRACT | Status: DC
  Administered 2022-04-30 – 2022-05-01 (×3): 3 mL via RESPIRATORY_TRACT
  Filled 2022-04-30 (×3): qty 3

## 2022-04-30 MED ORDER — APIXABAN 5 MG PO TABS
5.0000 mg | ORAL_TABLET | Freq: Two times a day (BID) | ORAL | Status: DC
  Administered 2022-04-30 – 2022-05-01 (×3): 5 mg via ORAL
  Filled 2022-04-30 (×3): qty 1

## 2022-04-30 MED ORDER — PATIROMER SORBITEX CALCIUM 8.4 G PO PACK
25.2000 g | PACK | Freq: Every day | ORAL | Status: DC
  Filled 2022-04-30: qty 3

## 2022-04-30 MED ORDER — AMOXICILLIN-POT CLAVULANATE 500-125 MG PO TABS
1.0000 | ORAL_TABLET | Freq: Every day | ORAL | Status: DC
  Administered 2022-04-30: 1 via ORAL
  Filled 2022-04-30 (×2): qty 1

## 2022-04-30 MED ORDER — ISOSORBIDE DINITRATE 20 MG PO TABS
20.0000 mg | ORAL_TABLET | Freq: Three times a day (TID) | ORAL | Status: DC
  Administered 2022-04-30 – 2022-05-01 (×3): 20 mg via ORAL
  Filled 2022-04-30 (×5): qty 1

## 2022-04-30 MED ORDER — STERILE WATER FOR INJECTION IJ SOLN
INTRAMUSCULAR | Status: AC
  Administered 2022-04-30: 10 mL
  Filled 2022-04-30: qty 10

## 2022-04-30 MED ORDER — HYDRALAZINE HCL 50 MG PO TABS
50.0000 mg | ORAL_TABLET | Freq: Three times a day (TID) | ORAL | Status: DC
  Administered 2022-04-30 – 2022-05-01 (×3): 50 mg via ORAL
  Filled 2022-04-30 (×3): qty 1

## 2022-04-30 NOTE — Plan of Care (Signed)

## 2022-04-30 NOTE — Progress Notes (Signed)
  Progress Note   Patient: Anthony Hess [id number] DOB: [birth year] DOA: 04/26/2022     4 DOS: the patient was seen and examined on 04/30/2022   Brief hospital course:  Assessment and Plan: Perforated duodenal ulcer  - s/p duodenal perforation repair done on 3/4 - IV protonix 40 mg q12  - IV zosyn 2.25 g q8hr  - IV dilaudid 0.5 mg q4 hr PRN  - Dilaudid 2 mg PO q6 hr PRN    Hyperkalemia due to ESRD, resolved after HD - Dialysis per nephro   ESRD on hemodialysis TTS schedule - Dialysis per nephro  - Calcitriol 0.5 mcg PO daily     CAD s/p CABG, paroxysmal A-fib on DOAC, HTN, HLD, PVD, infrarenal abdominal aortic aneurysm without rupture - Lipitor 80 mg PO daily    History of pulmonary embolism - Eliquis 5 mg PO bid   HTN  - Hydralazine 50 mg PO tid  - Isordil 20 mg PO tid   DVT prophylaxis: Eliquis 5 mg PO bid      Subjective: Pt seen and examined at the bedside.  He had some wheezes this morning and he was started on duoneb and pulmicort. Appreciate surgery following along. Likely discharge tmr after dialysis.  Physical Exam: Vitals:   04/29/22 1612 04/29/22 2053 04/30/22 0027 04/30/22 0737  BP: 98/66 (!) 168/112 (!) 150/113 (!) 175/114  Pulse: 95 79 87 76  Resp: 16 20 16 16   Temp: 98.7 F (37.1 C) 98.3 F (36.8 C) 97.6 F (36.4 C) 98.3 F (36.8 C)  TempSrc: Oral  Oral Oral  SpO2: 94% 95% 93% 92%  Weight:       Physical Exam Constitutional:      Appearance: He is well-developed.  HENT:     Head: Normocephalic and atraumatic.  Cardiovascular:     Rate and Rhythm: Normal rate and regular rhythm.  Pulmonary:     Effort: Pulmonary effort is normal.     Breath sounds: Wheezing present.  Abdominal:     General: Abdomen is flat.     Tenderness: There is abdominal tenderness.  Skin:    General: Skin is warm.  Neurological:     Mental Status: He is alert and oriented to person, place, and time.  Psychiatric:        Mood and Affect: Mood normal.     Data  Reviewed:   Disposition: Status is: Inpatient  Planned Discharge Destination: Home    Time spent: 35 minutes  Author: Lucienne Minks , MD 04/30/2022 8:39 AM  For on call review [email or web address].

## 2022-04-30 NOTE — Progress Notes (Signed)
[facility]  ROUNDING NOTE   Subjective:   Anthony Hess is a 67 year old male with past medical conditions including hypertension, PVD, dyslipidemia, CAD, four-vessel CABG, and end-stage renal disease on hemodialysis.  Patient presents to the emergency department with complaints of abdominal painand has been admitted for Pneumoperitoneum [K66.8] Hyperkalemia [E87.5] Perforation bowel ([place]) [K63.1] Abdominal pain, unspecified abdominal location [R10.9]  Patient is known to our practice from previous admissions and currently receives outpatient dialysis treatments at [facility] on a TTS schedule, supervised by Dr. Holley Raring.    Patient seen resting in bed, alert and oriented Patient states he feels well today Tolerating soft food without nausea or vomiting Denies abdominal pain Room air, no lower extremity edema  Objective:  Vital signs in last 24 hours:  Temp:  [97.6 F (36.4 C)-98.7 F (37.1 C)] 98.3 F (36.8 C) (03/08 1228) Pulse Rate:  [74-95] 84 (03/08 1228) Resp:  [16-20] 16 (03/08 0737) BP: (98-175)/(66-118) 125/93 (03/08 1228) SpO2:  [92 %-95 %] 94 % (03/08 1228)  Weight change:  Filed Weights   04/27/22 1238 04/29/22 0845 04/29/22 1302  Weight: 54.2 kg 53.8 kg 52.2 kg    Intake/Output: I/O last 3 completed shifts: In: 929.4 [P.O.:480; I.V.:329.4; IV Piggyback:120] Out: 1200 [Drains:100; Other:1100]   Intake/Output this shift:  Total I/O In: 252.3 [I.V.:122.3; IV Piggyback:130] Out: 25 [Drains:25]  Physical Exam: General: NAD  Head: Normocephalic, atraumatic. Moist oral mucosal membranes  Eyes: Anicteric  Lungs:  Clear to auscultation, normal effort  Heart: Regular rate and rhythm  Abdomen:  Soft, tender, mild distention  Extremities: No peripheral edema.  Neurologic: Somnolent, moving all four extremities  Skin: No lesions  Access: Right PermCath    Basic Metabolic Panel: Recent Labs  Lab 04/26/22 1518 04/26/22 1932 04/27/22 0535  04/28/22 0345 04/29/22 0407 04/30/22 0908  NA 138  --  133* 136 136 135  K 5.9* 5.0 5.7* 5.0 5.7* 4.2  CL 102  --  94* 95* 94* 95*  CO2 17*  --  '24 27 24 24  '$ GLUCOSE 98  --  181* 90 80 92  BUN 77*  --  84* 46* 68* 44*  CREATININE 9.90*  --  10.42* 6.98* 9.00* 7.08*  CALCIUM 7.4*  --  7.4* 7.2* 7.2* 7.8*  MG  --   --  2.0 1.9 2.3 2.1  PHOS  --   --  10.1* 8.8* 9.7* 7.3*     Liver Function Tests: Recent Labs  Lab 04/26/22 1518 04/30/22 0908  AST 15 14*  ALT 15 <5  ALKPHOS 52 46  BILITOT 0.5 0.8  PROT 7.2 6.2*  ALBUMIN 3.8 2.6*    Recent Labs  Lab 04/26/22 1518  LIPASE 38    No results for input(s): "AMMONIA" in the last 168 hours.  CBC: Recent Labs  Lab 04/26/22 1518 04/27/22 0535 04/28/22 0345 04/29/22 0407 04/30/22 0908  WBC 17.2* 22.9* 13.6* 13.3* 9.6  HGB 12.4* 11.5* 10.5* 10.9* 10.2*  HCT 40.9 36.3* 33.7* 35.2* 32.7*  MCV 95.6 92.1 92.3 93.4 91.6  PLT 323 283 251 297 324     Cardiac Enzymes: No results for input(s): "CKTOTAL", "CKMB", "CKMBINDEX", "TROPONINI" in the last 168 hours.  BNP: Invalid input(s): "POCBNP"  CBG: No results for input(s): "GLUCAP" in the last 168 hours.  Microbiology: Results for orders placed or performed during the hospital encounter of 04/26/22  Resp panel by RT-PCR (RSV, Flu A&B, Covid) Anterior Nasal Swab     Status: None  Collection Time: 04/26/22  6:43 PM   Specimen: Anterior Nasal Swab  Result Value Ref Range Status   SARS Coronavirus 2 by RT PCR NEGATIVE NEGATIVE Final    Comment: (NOTE) SARS-CoV-2 target nucleic acids are NOT DETECTED.  The SARS-CoV-2 RNA is generally detectable in upper respiratory specimens during the acute phase of infection. The lowest concentration of SARS-CoV-2 viral copies this assay can detect is 138 copies/mL. A negative result does not preclude SARS-Cov-2 infection and should not be used as the sole basis for treatment or other patient management decisions. A negative result  may occur with  improper specimen collection/handling, submission of specimen other than nasopharyngeal swab, presence of viral mutation(s) within the areas targeted by this assay, and inadequate number of viral copies(<138 copies/mL). A negative result must be combined with clinical observations, patient history, and epidemiological information. The expected result is Negative.  Fact Sheet for Patients:  EntrepreneurPulse.com.au  Fact Sheet for Healthcare Providers:  IncredibleEmployment.be  This test is no t yet approved or cleared by the [place] FDA and  has been authorized for detection and/or diagnosis of SARS-CoV-2 by FDA under an Emergency Use Authorization (EUA). This EUA will remain  in effect (meaning this test can be used) for the duration of the COVID-19 declaration under Section 564(b)(1) of the Act, 21 U.S.C.section 360bbb-3(b)(1), unless the authorization is terminated  or revoked sooner.       Influenza A by PCR NEGATIVE NEGATIVE Final   Influenza B by PCR NEGATIVE NEGATIVE Final    Comment: (NOTE) The Xpert Xpress SARS-CoV-2/FLU/RSV plus assay is intended as an aid in the diagnosis of influenza from Nasopharyngeal swab specimens and should not be used as a sole basis for treatment. Nasal washings and aspirates are unacceptable for Xpert Xpress SARS-CoV-2/FLU/RSV testing.  Fact Sheet for Patients: EntrepreneurPulse.com.au  Fact Sheet for Healthcare Providers: IncredibleEmployment.be  This test is not yet approved or cleared by the [place] FDA and has been authorized for detection and/or diagnosis of SARS-CoV-2 by FDA under an Emergency Use Authorization (EUA). This EUA will remain in effect (meaning this test can be used) for the duration of the COVID-19 declaration under Section 564(b)(1) of the Act, 21 U.S.C. section 360bbb-3(b)(1), unless the authorization is terminated  or revoked.     Resp Syncytial Virus by PCR NEGATIVE NEGATIVE Final    Comment: (NOTE) Fact Sheet for Patients: EntrepreneurPulse.com.au  Fact Sheet for Healthcare Providers: IncredibleEmployment.be  This test is not yet approved or cleared by the [place] FDA and has been authorized for detection and/or diagnosis of SARS-CoV-2 by FDA under an Emergency Use Authorization (EUA). This EUA will remain in effect (meaning this test can be used) for the duration of the COVID-19 declaration under Section 564(b)(1) of the Act, 21 U.S.C. section 360bbb-3(b)(1), unless the authorization is terminated or revoked.  Performed at [facility], [address] [place], [place] [address]     Coagulation Studies: Recent Labs    04/29/22 1405  LABPROT 14.8  INR 1.2     Urinalysis: No results for input(s): "COLORURINE", "LABSPEC", "PHURINE", "GLUCOSEU", "HGBUR", "BILIRUBINUR", "KETONESUR", "PROTEINUR", "UROBILINOGEN", "NITRITE", "LEUKOCYTESUR" in the last 72 hours.  Invalid input(s): "APPERANCEUR"    Imaging: CT ABDOMEN WO CONTRAST  Result Date: 04/29/2022 CLINICAL DATA:  Status post surgical repair of perforated duodenal ulcer. EXAM: CT ABDOMEN WITHOUT CONTRAST TECHNIQUE: Multidetector CT imaging of the abdomen was performed following the standard protocol without IV contrast. RADIATION DOSE REDUCTION: This exam was performed according to  the departmental dose-optimization program which includes automated exposure control, adjustment of the mA and/or kV according to patient size and/or use of iterative reconstruction technique. COMPARISON:  April 26, 2022. FINDINGS: Lower chest: Small bilateral pleural effusions are noted. Right lower lobe subsegmental atelectasis is noted. Hepatobiliary: Right hepatic cyst is noted. No cholelithiasis or biliary dilatation is noted. Pancreas: Unremarkable. No pancreatic ductal dilatation or surrounding  inflammatory changes. Spleen: Normal in size without focal abnormality. Adrenals/Urinary Tract: Adrenal glands appear normal. Bilateral renal atrophy is noted consistent with end-stage renal disease. No hydronephrosis or renal obstruction is noted. Stomach/Bowel: The stomach is unremarkable. Contrast is seen filling the stomach, proximal small bowel as well as the colon. No definite contrast extravasation is noted. Vascular/Lymphatic: Aortic atherosclerosis. No enlarged abdominal lymph nodes. Other: Surgical drain is seen in right upper quadrant. Mild amount of pneumoperitoneum is noted consistent with recent postoperative status. No definite abnormal fluid collection is noted within the visualized portion of the abdomen. Musculoskeletal: No acute or significant osseous findings. IMPRESSION: Status post surgical repair of perforated duodenal ulcer. No definite contrast extravasation is noted in the visualized portion of the abdomen currently. Surgical drain is noted in right upper quadrant. Mild pneumoperitoneum is noted consistent with postoperative status. Small bilateral pleural effusions are noted. Probable right lower lobe subsegmental atelectasis is noted. Bilateral renal atrophy is noted consistent with end-stage renal disease. Aortic Atherosclerosis (ICD10-I70.0). Electronically Signed   By: Marijo Conception M.D.   On: 04/29/2022 09:01     Medications:    sodium chloride 250 mL (04/28/22 0601)     amoxicillin-clavulanate  1 tablet Oral Daily   apixaban  5 mg Oral BID   atorvastatin  80 mg Oral QHS   budesonide (PULMICORT) nebulizer solution  0.5 mg Nebulization BID   calcitRIOL  0.5 mcg Oral Daily   Chlorhexidine Gluconate Cloth  6 each Topical Q0600   hydrALAZINE  50 mg Oral TID   ipratropium-albuterol  3 mL Nebulization Q6H   isosorbide dinitrate  20 mg Oral TID   nicotine  14 mg Transdermal Daily   pantoprazole (PROTONIX) IV  40 mg Intravenous Q12H   sodium chloride flush  3 mL  Intravenous Q12H   sodium chloride flush  3 mL Intravenous Q12H   sodium chloride, acetaminophen **OR** acetaminophen, hydrALAZINE, HYDROmorphone (DILAUDID) injection, HYDROmorphone, metoprolol tartrate, nicotine polacrilex, ondansetron **OR** ondansetron (ZOFRAN) IV, sodium chloride flush  Assessment/ Plan:  Mr. Miklos Steely is a 67 y.o.  male  Thaddeous Lorino is a 67 year old male with past medical conditions including hypertension, PVD, dyslipidemia, CAD, four-vessel CABG, and end-stage renal disease on hemodialysis.  Patient presents to the emergency department with complaints of shortness of breath and has been admitted for Pneumoperitoneum [K66.8] Hyperkalemia [E87.5] Perforation bowel ([place]) [K63.1] Abdominal pain, unspecified abdominal location [R10.9]  [facility]/TTS/right PermCath.  End-stage renal disease with hyperkalemia on hemodialysis.    Patient received dialysis yesterday, UF 1.1 L achieved.  Next treatment scheduled for Saturday.    2. Anemia of chronic kidney disease Lab Results  Component Value Date   HGB 10.2 (L) 04/30/2022    Patient receives Mircera at outpatient clinic.  Hemoglobin remains within desired goal.  No need for ESA's at this time.  3. Secondary Hyperparathyroidism: with outpatient labs: PTH 532, phosphorus 10.7, calcium 6.1 on 02/11/22.  :  Lab Results  Component Value Date   PTH 159 (H) 12/27/2019   CALCIUM 7.8 (L) 04/30/2022   CAION 0.84 (LL) 09/11/2021   PHOS  7.3 (H) 04/30/2022    Hypocalcemia/hyperphosphatemia-continue daily calcitriol.  Prescribed calcium acetate and ergocalciferol outpatient.   Calcium and phosphorus remain outside of desired target however improving.  4.  Hypertension with chronic kidney disease.  Home regimen includes amlodipine, clonidine, metoprolol, Entresto and losartan.  Currently receiving hydralazine, isosorbide, metoprolol, and Entresto.  Blood pressure 125/93, acceptable for this patient.  5.  Bowel  perforation, surgery consulted and performed bowel repair with Phillip Heal patch on 04/26/2022.  GI imaging series completed this morning, findings acceptable.  Tolerating soft diet without GI symptoms.   LOS: [place] 3/8/20241:02 PM

## 2022-04-30 NOTE — Progress Notes (Signed)
Patient ID: Anthony Hess, male   DOB: 04/20/1955, 67 y.o.   MRN: [id number]     [facility] Day(s): 4.   Interval History: Patient seen and examined, no acute events or new complaints overnight. Patient reports feeling well.  He denies abdominal pain.  He denies any nausea or vomiting.  Vital signs in last 24 hours: [min-max] current  Temp:  [97.6 F (36.4 C)-98.7 F (37.1 C)] 97.6 F (36.4 C) (03/08 0027) Pulse Rate:  [75-102] 87 (03/08 0027) Resp:  [11-20] 16 (03/08 0027) BP: (98-179)/(65-132) 150/113 (03/08 0027) SpO2:  [87 %-97 %] 93 % (03/08 0027) Weight:  [52.2 kg-53.8 kg] 52.2 kg (03/07 1302)       Weight: 52.2 kg BMI (Calculated): 20.39   Physical Exam:  Constitutional: alert, cooperative and no distress  Respiratory: breathing non-labored at rest  Cardiovascular: regular rate and sinus rhythm  Gastrointestinal: soft, non-tender, and non-distended.  The wounds are clean.  Labs:     Latest Ref Rng & Units 04/29/2022    4:07 AM 04/28/2022    3:45 AM 04/27/2022    5:35 AM  CBC  WBC 4.0 - 10.5 K/uL 13.3  13.6  22.9   Hemoglobin 13.0 - 17.0 g/dL 10.9  10.5  11.5   Hematocrit 39.0 - 52.0 % 35.2  33.7  36.3   Platelets 150 - 400 K/uL 297  251  283       Latest Ref Rng & Units 04/29/2022    4:07 AM 04/28/2022    3:45 AM 04/27/2022    5:35 AM  CMP  Glucose 70 - 99 mg/dL 80  90  181   BUN 8 - 23 mg/dL 68  46  84   Creatinine 0.61 - 1.24 mg/dL 9.00  6.98  10.42   Sodium 135 - 145 mmol/L 136  136  133   Potassium 3.5 - 5.1 mmol/L 5.7  5.0  5.7   Chloride 98 - 111 mmol/L 94  95  94   CO2 22 - 32 mmol/L '24  27  24   '$ Calcium 8.9 - 10.3 mg/dL 7.2  7.2  7.4     Imaging studies: No new pertinent imaging studies   Assessment/Plan:  67 y.o. male with perforated duodenal ulcer 4 Day Post-Op s/p duodenal ulcer repair, complicated by pertinent comorbidities including recent non-STEMI, recent PE on Eliquis, end-stage renal disease on hemodialysis.    -Patient  recovering well from surgical standpoint.  He tolerated soft diet well. -Drain with minimal output, serous.  Drain was removed today. -No contraindication to anticoagulation at this point -From the surgical standpoint patient can be discharged when medically stable -Recommend to continue high-dose PPI. -Continue soft diet, it can be adjusted to meet renal failure recommendations. -Once patient discharged I will follow-up in my office in 2 weeks for wound check   Arnold Long, MD

## 2022-05-01 DIAGNOSIS — K631 Perforation of intestine (nontraumatic): Secondary | ICD-10-CM | POA: Diagnosis not present

## 2022-05-01 LAB — CBC
HCT: 29.8 % — ABNORMAL LOW (ref 39.0–52.0)
Hemoglobin: 9.3 g/dL — ABNORMAL LOW (ref 13.0–17.0)
MCH: 28.9 pg (ref 26.0–34.0)
MCHC: 31.2 g/dL (ref 30.0–36.0)
MCV: 92.5 fL (ref 80.0–100.0)
Platelets: 323 10*3/uL (ref 150–400)
RBC: 3.22 MIL/uL — ABNORMAL LOW (ref 4.22–5.81)
RDW: 16.4 % — ABNORMAL HIGH (ref 11.5–15.5)
WBC: 8.3 10*3/uL (ref 4.0–10.5)
nRBC: 0 % (ref 0.0–0.2)

## 2022-05-01 LAB — BASIC METABOLIC PANEL
Anion gap: 15 (ref 5–15)
BUN: 58 mg/dL — ABNORMAL HIGH (ref 8–23)
CO2: 25 mmol/L (ref 22–32)
Calcium: 7.7 mg/dL — ABNORMAL LOW (ref 8.9–10.3)
Chloride: 101 mmol/L (ref 98–111)
Creatinine, Ser: 9.5 mg/dL — ABNORMAL HIGH (ref 0.61–1.24)
GFR, Estimated: 6 mL/min — ABNORMAL LOW (ref >60)
Glucose, Bld: 97 mg/dL (ref 70–99)
Potassium: 4.3 mmol/L (ref 3.5–5.1)
Sodium: 141 mmol/L (ref 135–145)

## 2022-05-01 LAB — PHOSPHORUS: Phosphorus: 8.9 mg/dL — ABNORMAL HIGH (ref 2.5–4.6)

## 2022-05-01 LAB — MAGNESIUM: Magnesium: 2.4 mg/dL (ref 1.7–2.4)

## 2022-05-01 MED ORDER — PANTOPRAZOLE SODIUM 40 MG PO TBEC: 40.0000 mg | DELAYED_RELEASE_TABLET | Freq: Two times a day (BID) | ORAL | 0 refills | Status: DC

## 2022-05-01 MED ORDER — AMOXICILLIN-POT CLAVULANATE 500-125 MG PO TABS: 1.0000 | ORAL_TABLET | Freq: Every day | ORAL | 0 refills | Status: DC

## 2022-05-01 MED ORDER — APIXABAN 5 MG PO TABS: 5.0000 mg | ORAL_TABLET | Freq: Two times a day (BID) | ORAL | 0 refills | Status: DC

## 2022-05-01 MED ORDER — HEPARIN SODIUM (PORCINE) 1000 UNIT/ML IJ SOLN
INTRAMUSCULAR | Status: AC
  Filled 2022-05-01: qty 10

## 2022-05-01 NOTE — Progress Notes (Signed)
[facility]  PROGRESS NOTE   Subjective:   Patient seen on dialysis.  Vitals are stable.  Objective:  Vital signs: Blood pressure (!) 174/105, pulse 70, temperature 97.9 F (36.6 C), temperature source Oral, resp. rate 14, weight 54.6 kg, SpO2 95 %.  Intake/Output Summary (Last 24 hours) at 05/01/2022 1158 Last data filed at 05/01/2022 0700 Gross per 24 hour  Intake 80 ml  Output --  Net 80 ml   Filed Weights   04/29/22 0845 04/29/22 1302 05/01/22 0829  Weight: 53.8 kg 52.2 kg 54.6 kg     Physical Exam: General:  No acute distress  Head:  Normocephalic, atraumatic. Moist oral mucosal membranes  Eyes:  Anicteric  Neck:  Supple  Lungs:   Clear to auscultation, normal effort  Heart:  S1S2 no rubs  Abdomen:   Soft, nontender, bowel sounds present  Extremities:  peripheral edema.  Neurologic:  Awake, alert, following commands  Skin:  No lesions  Access:     Basic Metabolic Panel: Recent Labs  Lab 04/27/22 0535 04/28/22 0345 04/29/22 0407 04/30/22 0908 05/01/22 0611  NA 133* 136 136 135 141  K 5.7* 5.0 5.7* 4.2 4.3  CL 94* 95* 94* 95* 101  CO2 '24 27 24 24 25  '$ GLUCOSE 181* 90 80 92 97  BUN 84* 46* 68* 44* 58*  CREATININE 10.42* 6.98* 9.00* 7.08* 9.50*  CALCIUM 7.4* 7.2* 7.2* 7.8* 7.7*  MG 2.0 1.9 2.3 2.1 2.4  PHOS 10.1* 8.8* 9.7* 7.3* 8.9*   GFR: Estimated Creatinine Clearance: 6.1 mL/min (A) (by C-G formula based on SCr of 9.5 mg/dL (H)).  Liver Function Tests: Recent Labs  Lab 04/26/22 1518 04/30/22 0908  AST 15 14*  ALT 15 <5  ALKPHOS 52 46  BILITOT 0.5 0.8  PROT 7.2 6.2*  ALBUMIN 3.8 2.6*   Recent Labs  Lab 04/26/22 1518  LIPASE 38   No results for input(s): "AMMONIA" in the last 168 hours.  CBC: Recent Labs  Lab 04/27/22 0535 04/28/22 0345 04/29/22 0407 04/30/22 0908 05/01/22 0611  WBC 22.9* 13.6* 13.3* 9.6 8.3  HGB 11.5* 10.5* 10.9* 10.2* 9.3*  HCT 36.3* 33.7* 35.2* 32.7* 29.8*  MCV 92.1 92.3 93.4 91.6 92.5  PLT 283  251 297 324 323     HbA1C: Hgb A1c MFr Bld  Date/Time Value Ref Range Status  12/27/2019 03:50 PM 5.6 4.8 - 5.6 % Final    Comment:    (NOTE) Pre diabetes:          5.7%-6.4%  Diabetes:              >6.4%  Glycemic control for   <7.0% adults with diabetes   12/25/2018 12:20 PM 6.0 (H) 4.8 - 5.6 % Final    Comment:    (NOTE) Pre diabetes:          5.7%-6.4% Diabetes:              >6.4% Glycemic control for   <7.0% adults with diabetes     Urinalysis: No results for input(s): "COLORURINE", "LABSPEC", "PHURINE", "GLUCOSEU", "HGBUR", "BILIRUBINUR", "KETONESUR", "PROTEINUR", "UROBILINOGEN", "NITRITE", "LEUKOCYTESUR" in the last 72 hours.  Invalid input(s): "APPERANCEUR"    Imaging: No results found.   Medications:    sodium chloride 250 mL (04/28/22 0601)    amoxicillin-clavulanate  1 tablet Oral Daily   apixaban  5 mg Oral BID   atorvastatin  80 mg Oral QHS   budesonide (PULMICORT) nebulizer solution  0.5 mg Nebulization BID  calcitRIOL  0.5 mcg Oral Daily   Chlorhexidine Gluconate Cloth  6 each Topical Q0600   heparin sodium (porcine)       hydrALAZINE  50 mg Oral TID   ipratropium-albuterol  3 mL Nebulization TID   isosorbide dinitrate  20 mg Oral TID   nicotine  14 mg Transdermal Daily   pantoprazole (PROTONIX) IV  40 mg Intravenous Q12H   sodium chloride flush  3 mL Intravenous Q12H   sodium chloride flush  3 mL Intravenous Q12H    Assessment/ Plan:     Anthony Hess is a 67 year old male with past medical conditions including hypertension, PVD, dyslipidemia, CAD, four-vessel CABG, and end-stage renal disease on hemodialysis.  Patient presents to the emergency department with complaints of abdominal painand has been admitted for Pneumoperitoneum [K66.8] Hyperkalemia [E87.5] Perforation bowel ([place]) [K63.1]  #1: ESRD: We will continue stable dialysis.  Will attempt fluid removal as tolerated.  Will continue patient on TTS protocol.  #2: Anemia: Continue  anemia protocol.  #3: Hypertension: Presently on hydralazine.  #4: Perforated colon.  Status postrepair.  Will follow closely.   LOS: [place], MD [facility] 3/9/202411:58 AM

## 2022-05-01 NOTE — Plan of Care (Signed)

## 2022-05-01 NOTE — Discharge Summary (Signed)
Physician Discharge Summary   Patient: Anthony Hess MRN: [id number] DOB: 03-24-1955  Admit date:     04/26/2022  Discharge date: 05/01/22  Discharge Physician: Lucienne Minks    PCP: Patient, No Pcp Per   Recommendations at discharge:    Please take the protonix 40 mg PO bid   Discharge Diagnoses: Principal Problem:   Perforation bowel ([place])  Resolved Problems:   * No resolved hospital problems. *  Hospital Course: 67 yo M treated for perforated duodenal ulcer. General surgery was consulted and the pt underwent a duodenal perforation repair on 04/26/2022.  He was maintained on IV protonix 40 mg sq q12 and IV zosyn. Pain was controlled with dilaudid. Diet was advanced slowly. He tolerated the clears and was able to tolerate solids.  He continued his dialysis while admitted here in the hospital.  He was briefly on a heparin drip but this was later changed to eliquis. Pt reports he was able to handle the eliquis (it was very expensive which is why he did not take it). Pharmacy staff confirmed that his co-pay was $11.00 (pt advised that he could afford this). Last dialysis was on 05/01/2022. He will be discharged 05/01/2022.  Assessment and Plan: No notes have been filed under this hospital service. Service: Hospitalist       Procedures performed: Surgery as above   Disposition: Home Diet recommendation:  Renal diet DISCHARGE MEDICATION: Allergies as of 05/01/2022   No Known Allergies      Medication List     STOP taking these medications    aspirin EC 81 MG tablet   Entresto 24-26 MG Generic drug: sacubitril-valsartan       TAKE these medications    acetaminophen 500 MG tablet Commonly known as: TYLENOL Take 2 tablets (1,000 mg total) by mouth every 6 (six) hours as needed for mild pain, fever or headache (or Fever >/= 101).   amoxicillin-clavulanate 500-125 MG tablet Commonly known as: AUGMENTIN Take 1 tablet by mouth daily for 3 days.   apixaban 5 MG Tabs  tablet Commonly known as: ELIQUIS Take 1 tablet (5 mg total) by mouth 2 (two) times daily. What changed: Another medication with the same name was removed. Continue taking this medication, and follow the directions you see here.   atorvastatin 80 MG tablet Commonly known as: LIPITOR Take 1 tablet (80 mg total) by mouth daily.   calcitRIOL 0.5 MCG capsule Commonly known as: ROCALTROL Take 1 capsule (0.5 mcg total) by mouth daily.   calcium acetate 667 MG capsule Commonly known as: PHOSLO Take 1 capsule (667 mg total) by mouth 3 (three) times daily with meals.   hydrALAZINE 25 MG tablet Commonly known as: APRESOLINE Take 1 tablet (25 mg total) by mouth 3 (three) times daily.   isosorbide dinitrate 10 MG tablet Commonly known as: ISORDIL Take 1 tablet (10 mg total) by mouth 3 (three) times daily.   metoprolol succinate 25 MG 24 hr tablet Commonly known as: TOPROL-XL Take 25 mg by mouth daily.   multivitamin Tabs tablet Take 1 tablet by mouth at bedtime.   pantoprazole 40 MG tablet Commonly known as: Protonix Take 1 tablet (40 mg total) by mouth 2 (two) times daily.   Velphoro 500 MG chewable tablet Generic drug: sucroferric oxyhydroxide Chew 1 tablet (500 mg total) by mouth 3 (three) times daily with meals.   Vitamin D (Ergocalciferol) 1.25 MG (50000 UNIT) Caps capsule Commonly known as: DRISDOL Take 1 capsule (50,000 Units total) by mouth every  7 (seven) days.        Follow-up Information     [facility]. Schedule an appointment as soon as possible for a visit today.   Why: Call and schedule an appointment with a primary care provider  [facility] [address], [place], [place] [address] [phone]               Discharge Exam: Danley Danker Weights   04/29/22 1302 05/01/22 0829 05/01/22 1232  Weight: 52.2 kg 54.6 kg 53.1 kg    Condition at discharge: fair  The results of significant diagnostics from this hospitalization  (including imaging, microbiology, ancillary and laboratory) are listed below for reference.   Imaging Studies: CT ABDOMEN WO CONTRAST  Result Date: 04/29/2022 CLINICAL DATA:  Status post surgical repair of perforated duodenal ulcer. EXAM: CT ABDOMEN WITHOUT CONTRAST TECHNIQUE: Multidetector CT imaging of the abdomen was performed following the standard protocol without IV contrast. RADIATION DOSE REDUCTION: This exam was performed according to the departmental dose-optimization program which includes automated exposure control, adjustment of the mA and/or kV according to patient size and/or use of iterative reconstruction technique. COMPARISON:  April 26, 2022. FINDINGS: Lower chest: Small bilateral pleural effusions are noted. Right lower lobe subsegmental atelectasis is noted. Hepatobiliary: Right hepatic cyst is noted. No cholelithiasis or biliary dilatation is noted. Pancreas: Unremarkable. No pancreatic ductal dilatation or surrounding inflammatory changes. Spleen: Normal in size without focal abnormality. Adrenals/Urinary Tract: Adrenal glands appear normal. Bilateral renal atrophy is noted consistent with end-stage renal disease. No hydronephrosis or renal obstruction is noted. Stomach/Bowel: The stomach is unremarkable. Contrast is seen filling the stomach, proximal small bowel as well as the colon. No definite contrast extravasation is noted. Vascular/Lymphatic: Aortic atherosclerosis. No enlarged abdominal lymph nodes. Other: Surgical drain is seen in right upper quadrant. Mild amount of pneumoperitoneum is noted consistent with recent postoperative status. No definite abnormal fluid collection is noted within the visualized portion of the abdomen. Musculoskeletal: No acute or significant osseous findings. IMPRESSION: Status post surgical repair of perforated duodenal ulcer. No definite contrast extravasation is noted in the visualized portion of the abdomen currently. Surgical drain is noted in right  upper quadrant. Mild pneumoperitoneum is noted consistent with postoperative status. Small bilateral pleural effusions are noted. Probable right lower lobe subsegmental atelectasis is noted. Bilateral renal atrophy is noted consistent with end-stage renal disease. Aortic Atherosclerosis (ICD10-I70.0). Electronically Signed   By: Marijo Conception M.D.   On: 04/29/2022 09:01   DG UGI W SINGLE CM (SOL OR THIN BA)  Result Date: 04/28/2022 CLINICAL DATA:  Patient is 2 days status post duodenal ulcer repair. EXAM: DG UGI W SINGLE CM TECHNIQUE: Single contrast examination was then performed using thin liquid barium. This exam was performed by Reatha Armour, PA-C , and was supervised and interpreted by Dr Kathreen Devoid. FLUOROSCOPY: Radiation Exposure Index (as provided by the fluoroscopic device): 10.20 mGy Kerma COMPARISON:  None Available. FINDINGS: Esophagus:  Normal appearance. Esophageal motility:  Within normal limits. Gastroesophageal reflux:  None visualized. Ingested 42m barium tablet: Not given Stomach: Normal appearance. No hiatal hernia. Gastric emptying: Normal. Duodenum: Normal appearance. No contrast extravasation visualized on today's exam. Other:  None. IMPRESSION: Limited upper GI study conducted secondary to limited mobility and limited ability to tolerate contrast material. No extraluminal contrast to suggest a duodenal leak or perforation. If there is persistent clinical concern, recommend a CT of the abdomen with oral contrast only. Electronically Signed   By: HBoston ServiceD.  On: 04/28/2022 11:05   CT ABDOMEN PELVIS WO CONTRAST  Result Date: 04/26/2022 CLINICAL DATA:  Right lower quadrant abdominal pain. EXAM: CT ABDOMEN AND PELVIS WITHOUT CONTRAST TECHNIQUE: Multidetector CT imaging of the abdomen and pelvis was performed following the standard protocol without IV contrast. RADIATION DOSE REDUCTION: This exam was performed according to the departmental dose-optimization program which includes  automated exposure control, adjustment of the mA and/or kV according to patient size and/or use of iterative reconstruction technique. COMPARISON:  CT abdomen pelvis dated 04/19/2022. FINDINGS: Evaluation of this exam is limited in the absence of intravenous contrast. Evaluation is also limited due to respiratory motion and paucity of intra-abdominal fat. Lower chest: The visualized lung bases are clear. There is pneumoperitoneum and small ascites. Hepatobiliary: A 2 cm hypodense lesion in the dome of the liver not characterized on this CT but was present on the prior CT. Small hypodense lesion in the left lobe of the liver also present on the prior CT. A small cyst noted in the right lobe 1 of the liver inferiorly. No dilatation. There is layering sludge and small stones within the gallbladder. No pericholecystic fluid. Pancreas: The pancreas is grossly unremarkable as visualized. Spleen: Normal in size without focal abnormality. Adrenals/Urinary Tract: The adrenal glands are unremarkable. Moderate bilateral renal parenchyma atrophy. There is no hydronephrosis or nephrolithiasis on either side. The urinary bladder is minimally distended and grossly unremarkable. Stomach/Bowel: There is no bowel obstruction. The appendix is unremarkable as visualized. Vascular/Lymphatic: Advanced aortoiliac atherosclerotic disease. The aorta is tortuous. The IVC is grossly unremarkable. No obvious adenopathy. Reproductive: Mildly enlarged prostate gland measuring 4.5 cm in transverse axial diameter. Other: None Musculoskeletal: Degenerative changes of the spine and scoliosis. No acute osseous pathology. IMPRESSION: 1. Pneumoperitoneum and small ascites. Findings are concerning for bowel perforation. Surgical consult is advised. 2. Cholelithiasis. 3. No hydronephrosis or nephrolithiasis. 4. No bowel obstruction. Normal appendix. 5.  Aortic Atherosclerosis (ICD10-I70.0). These results were called by telephone at the time of  interpretation on 04/26/2022 at 4:49 pm to provider [facility] , who verbally acknowledged these results. Electronically Signed   By: Anner Crete M.D.   On: 04/26/2022 16:51   DG Chest Portable 1 View  Result Date: 04/26/2022 CLINICAL DATA:  Shortness of breath, abdominal pain EXAM: PORTABLE CHEST 1 VIEW COMPARISON:  Previous studies including the examination of 04/19/2022 FINDINGS: Transverse diameter of heart is slightly increased. Central pulmonary vessels are slightly prominent. Thoracic aorta is tortuous and ectatic. Tip of dialysis catheter is seen at the junction of superior vena cava and right atrium. There is previous coronary bypass surgery. Faint lucency is seen in the right hemidiaphragm. Patient is scheduled for CT abdomen and pelvis. Dextroscoliosis is seen in thoracic spine. IMPRESSION: There are no signs of pulmonary edema or focal pulmonary consolidation. There is no pleural effusion or pneumothorax. Faint lucencies are seen under the right hemidiaphragm suggesting possible pneumoperitoneum. Electronically Signed   By: Elmer Picker M.D.   On: 04/26/2022 16:43   US Venous Img Lower Bilateral (DVT)  Result Date: 04/20/2022 CLINICAL DATA:  History of smoking. History of previous DVT. Evaluate for acute or chronic DVT. EXAM: BILATERAL LOWER EXTREMITY VENOUS DOPPLER ULTRASOUND TECHNIQUE: Gray-scale sonography with graded compression, as well as color Doppler and duplex ultrasound were performed to evaluate the lower extremity deep venous systems from the level of the common femoral vein and including the common femoral, femoral, profunda femoral, popliteal and calf veins including the posterior tibial, peroneal and gastrocnemius veins  when visible. The superficial great saphenous vein was also interrogated. Spectral Doppler was utilized to evaluate flow at rest and with distal augmentation maneuvers in the common femoral, femoral and popliteal veins. COMPARISON:  Chest  CT-04/19/2022 FINDINGS: RIGHT LOWER EXTREMITY Common Femoral Vein: No evidence of thrombus. Normal compressibility, respiratory phasicity and response to augmentation. Saphenofemoral Junction: No evidence of thrombus. Normal compressibility and flow on color Doppler imaging. Profunda Femoral Vein: No evidence of thrombus. Normal compressibility and flow on color Doppler imaging. Femoral Vein: No evidence of thrombus. Normal compressibility, respiratory phasicity and response to augmentation. Popliteal Vein: No evidence of thrombus. Normal compressibility, respiratory phasicity and response to augmentation. Calf Veins: No evidence of thrombus. Normal compressibility and flow on color Doppler imaging. Superficial Great Saphenous Vein: No evidence of thrombus. Normal compressibility. Other Findings:  None. LEFT LOWER EXTREMITY Common Femoral Vein: No evidence of thrombus. Normal compressibility, respiratory phasicity and response to augmentation. Saphenofemoral Junction: No evidence of thrombus. Normal compressibility and flow on color Doppler imaging. Profunda Femoral Vein: No evidence of thrombus. Normal compressibility and flow on color Doppler imaging. Femoral Vein: No evidence of thrombus. Normal compressibility, respiratory phasicity and response to augmentation. Popliteal Vein: No evidence of thrombus. Normal compressibility, respiratory phasicity and response to augmentation. Calf Veins: No evidence of thrombus. Normal compressibility and flow on color Doppler imaging. Superficial Great Saphenous Vein: No evidence of thrombus. Normal compressibility. Other Findings:  None. IMPRESSION: No evidence of acute or chronic DVT within either lower extremity. Electronically Signed   By: Sandi Mariscal M.D.   On: 04/20/2022 15:37   ECHOCARDIOGRAM COMPLETE  Result Date: 04/19/2022    ECHOCARDIOGRAM REPORT   Patient Name:   AOI FREHNER Date of Exam: 04/19/2022 Medical Rec #:  [id number]    Height:       63.0 in Accession  #:    [id number]   Weight:       137.0 lb Date of Birth:  1955-04-16   BSA:          1.646 m Patient Age:    39 years     BP:           129/81 mmHg Patient Gender: M            HR:           71 bpm. Exam Location:  [facility] Procedure: 2D Echo, Cardiac Doppler and Color Doppler Indications:     NSTEMI  History:         Patient has prior history of Echocardiogram examinations, most                  recent 12/28/2019. CHF, Acute MI, Previous Myocardial Infarction                  and CAD, Prior CABG, Arrythmias:Atrial Fibrillation; Risk                  Factors:Hypertension, Current Smoker and Dyslipidemia.                  Pulmonary embolus, AAA.  Sonographer:     Wenda Low Referring Phys:  [id number] JAN A MANSY Diagnosing Phys: Kate Sable MD IMPRESSIONS  1. Left ventricular ejection fraction, by estimation, is 55 to 60%. The left ventricle has normal function. The left ventricle has no regional wall motion abnormalities. There is moderate concentric left ventricular hypertrophy. Left ventricular diastolic parameters are consistent with Grade I diastolic dysfunction (impaired relaxation).  2. Right ventricular systolic  function is normal. The right ventricular size is normal. There is normal pulmonary artery systolic pressure.  3. The mitral valve is normal in structure. No evidence of mitral valve regurgitation.  4. The aortic valve is tricuspid. Aortic valve regurgitation is not visualized. Aortic valve sclerosis/calcification is present, without any evidence of aortic stenosis.  5. The inferior vena cava is normal in size with greater than 50% respiratory variability, suggesting right atrial pressure of 3 mmHg. FINDINGS  Left Ventricle: Left ventricular ejection fraction, by estimation, is 55 to 60%. The left ventricle has normal function. The left ventricle has no regional wall motion abnormalities. The left ventricular internal cavity size was normal in size. There is  moderate concentric left ventricular  hypertrophy. Left ventricular diastolic parameters are consistent with Grade I diastolic dysfunction (impaired relaxation). Right Ventricle: The right ventricular size is normal. No increase in right ventricular wall thickness. Right ventricular systolic function is normal. There is normal pulmonary artery systolic pressure. The tricuspid regurgitant velocity is 2.18 m/s, and  with an assumed right atrial pressure of 3 mmHg, the estimated right ventricular systolic pressure is [id number] mmHg. Left Atrium: Left atrial size was normal in size. Right Atrium: Right atrial size was normal in size. Pericardium: There is no evidence of pericardial effusion. Mitral Valve: The mitral valve is normal in structure. No evidence of mitral valve regurgitation. MV peak gradient, 6.4 mmHg. The mean mitral valve gradient is 2.0 mmHg. Tricuspid Valve: The tricuspid valve is normal in structure. Tricuspid valve regurgitation is trivial. Aortic Valve: The aortic valve is tricuspid. Aortic valve regurgitation is not visualized. Aortic valve sclerosis/calcification is present, without any evidence of aortic stenosis. Aortic valve mean gradient measures 6.0 mmHg. Aortic valve peak gradient measures 11.0 mmHg. Aortic valve area, by VTI measures 2.03 cm. Pulmonic Valve: The pulmonic valve was normal in structure. Pulmonic valve regurgitation is not visualized. Aorta: The aortic root is normal in size and structure. Venous: The inferior vena cava is normal in size with greater than 50% respiratory variability, suggesting right atrial pressure of 3 mmHg. IAS/Shunts: No atrial level shunt detected by color flow Doppler.  LEFT VENTRICLE PLAX 2D LVIDd:         3.80 cm   Diastology LVIDs:         2.90 cm   LV e' medial:    5.44 cm/s LV PW:         1.70 cm   LV E/e' medial:  9.3 LV IVS:        1.40 cm   LV e' lateral:   12.20 cm/s LVOT diam:     1.90 cm   LV E/e' lateral: 4.2 LV SV:         62 LV SV Index:   38 LVOT Area:     2.84 cm  RIGHT VENTRICLE  RV Basal diam:  4.10 cm RV Mid diam:    3.60 cm RV S prime:     6.96 cm/s LEFT ATRIUM             Index        RIGHT ATRIUM           Index LA diam:        3.90 cm 2.37 cm/m   RA Area:     14.60 cm LA Vol (A2C):   62.0 ml 37.66 ml/m  RA Volume:   30.80 ml  18.71 ml/m LA Vol (A4C):   31.5 ml 19.13 ml/m LA Biplane Vol: 44.0  ml 26.72 ml/m  AORTIC VALVE                     PULMONIC VALVE AV Area (Vmax):    2.17 cm      PV Vmax:       1.12 m/s AV Area (Vmean):   2.10 cm      PV Peak grad:  5.0 mmHg AV Area (VTI):     2.03 cm AV Vmax:           166.00 cm/s AV Vmean:          119.000 cm/s AV VTI:            0.307 m AV Peak Grad:      11.0 mmHg AV Mean Grad:      6.0 mmHg LVOT Vmax:         127.00 cm/s LVOT Vmean:        88.000 cm/s LVOT VTI:          0.220 m LVOT/AV VTI ratio: 0.72  AORTA Ao Root diam: 3.60 cm MITRAL VALVE               TRICUSPID VALVE MV Area (PHT): 2.70 cm    TR Peak grad:   19.0 mmHg MV Area VTI:   2.00 cm    TR Vmax:        218.00 cm/s MV Peak grad:  6.4 mmHg MV Mean grad:  2.0 mmHg    SHUNTS MV Vmax:       1.26 m/s    Systemic VTI:  0.22 m MV Vmean:      58.5 cm/s   Systemic Diam: 1.90 cm MV Decel Time: 281 msec MV E velocity: 50.80 cm/s MV A velocity: 68.10 cm/s MV E/A ratio:  0.75 Kate Sable MD Electronically signed by Kate Sable MD Signature Date/Time: 04/19/2022/4:02:54 PM    Final    CT ABDOMEN PELVIS W CONTRAST  Result Date: 04/19/2022 CLINICAL DATA:  Abdominal pain, acute, nonlocalized; Pulmonary embolism (PE) suspected, high prob EXAM: CT ANGIOGRAPHY CHEST CT ABDOMEN AND PELVIS WITH CONTRAST TECHNIQUE: Multidetector CT imaging of the chest was performed using the standard protocol during bolus administration of intravenous contrast. Multiplanar CT image reconstructions and MIPs were obtained to evaluate the vascular anatomy. Multidetector CT imaging of the abdomen and pelvis was performed using the standard protocol during bolus administration of intravenous  contrast. RADIATION DOSE REDUCTION: This exam was performed according to the departmental dose-optimization program which includes automated exposure control, adjustment of the mA and/or kV according to patient size and/or use of iterative reconstruction technique. CONTRAST:  153m OMNIPAQUE IOHEXOL 350 MG/ML SOLN COMPARISON:  11/16/2021 FINDINGS: CTA CHEST FINDINGS Cardiovascular: There is adequate opacification of the pulmonary arterial tree. A small intraluminal filling defect is identified within the segmental pulmonary arteries of the left lower lobe in keeping with changes of acute pulmonary embolism. The embolic burden is small. The central pulmonary arteries are of normal caliber. No CT evidence of right heart strain. Coronary artery bypass grafting has been performed. Cardiac size is globally within normal limits though left ventricular hypertrophy is noted. No pericardial effusion. Mild atherosclerotic calcification noted within the thoracic aorta. No aortic aneurysm. Right internal jugular hemodialysis catheter tip noted within the superior right atrium. Mediastinum/Nodes: No enlarged mediastinal, hilar, or axillary lymph nodes. Thyroid gland, trachea, and esophagus demonstrate no significant findings. Lungs/Pleura: Moderate emphysema. No superimposed focal pulmonary infiltrate. No pneumothorax or pleural effusion. No central obstructing lesion.  Musculoskeletal: Nonunion of the sternal body noted. No acute bone abnormality. No lytic or blastic bone lesion. Review of the MIP images confirms the above findings. CT ABDOMEN and PELVIS FINDINGS Hepatobiliary: Multiple simple cysts and at least 3 benign cavernous hemangioma are seen within the liver. No suspicious enhancing intrahepatic masses identified. No intra or extrahepatic biliary ductal dilation. Gallbladder unremarkable. Pancreas: Unremarkable. No pancreatic ductal dilatation or surrounding inflammatory changes. Spleen: Normal in size without focal  abnormality. Adrenals/Urinary Tract: The adrenal glands are unremarkable. The kidneys are markedly atrophic bilaterally but are otherwise unremarkable. The bladder is decompressed Stomach/Bowel: Stomach is within normal limits. Appendix appears normal. No evidence of bowel wall thickening, distention, or inflammatory changes. Vascular/Lymphatic: Extensive aortoiliac atherosclerotic calcification. No aortic aneurysm. No pathologic adenopathy within the abdomen and pelvis. Reproductive: Prostate is unremarkable. Other: No abdominal wall hernia or abnormality. No abdominopelvic ascites. Musculoskeletal: No acute bone abnormality. No lytic or blastic bone lesion. Lumbar levoscoliosis noted. Degenerative changes noted at the lumbosacral junction. Review of the MIP images confirms the above findings. IMPRESSION: 1. Acute pulmonary embolism. The embolic burden is small. No CT evidence of right heart strain. 2. Left ventricular hypertrophy. 3. Moderate emphysema. 4. No acute intra-abdominal pathology identified. 5. Marked bilateral renal atrophy in keeping with chronic renal failure. 6. Extensive aortoiliac atherosclerotic calcification. Aortic Atherosclerosis (ICD10-I70.0) and Emphysema (ICD10-J43.9). Electronically Signed   By: Fidela Salisbury M.D.   On: 04/19/2022 00:57   CT Angio Chest PE W and/or Wo Contrast  Result Date: 04/19/2022 CLINICAL DATA:  Abdominal pain, acute, nonlocalized; Pulmonary embolism (PE) suspected, high prob EXAM: CT ANGIOGRAPHY CHEST CT ABDOMEN AND PELVIS WITH CONTRAST TECHNIQUE: Multidetector CT imaging of the chest was performed using the standard protocol during bolus administration of intravenous contrast. Multiplanar CT image reconstructions and MIPs were obtained to evaluate the vascular anatomy. Multidetector CT imaging of the abdomen and pelvis was performed using the standard protocol during bolus administration of intravenous contrast. RADIATION DOSE REDUCTION: This exam was performed  according to the departmental dose-optimization program which includes automated exposure control, adjustment of the mA and/or kV according to patient size and/or use of iterative reconstruction technique. CONTRAST:  151m OMNIPAQUE IOHEXOL 350 MG/ML SOLN COMPARISON:  11/16/2021 FINDINGS: CTA CHEST FINDINGS Cardiovascular: There is adequate opacification of the pulmonary arterial tree. A small intraluminal filling defect is identified within the segmental pulmonary arteries of the left lower lobe in keeping with changes of acute pulmonary embolism. The embolic burden is small. The central pulmonary arteries are of normal caliber. No CT evidence of right heart strain. Coronary artery bypass grafting has been performed. Cardiac size is globally within normal limits though left ventricular hypertrophy is noted. No pericardial effusion. Mild atherosclerotic calcification noted within the thoracic aorta. No aortic aneurysm. Right internal jugular hemodialysis catheter tip noted within the superior right atrium. Mediastinum/Nodes: No enlarged mediastinal, hilar, or axillary lymph nodes. Thyroid gland, trachea, and esophagus demonstrate no significant findings. Lungs/Pleura: Moderate emphysema. No superimposed focal pulmonary infiltrate. No pneumothorax or pleural effusion. No central obstructing lesion. Musculoskeletal: Nonunion of the sternal body noted. No acute bone abnormality. No lytic or blastic bone lesion. Review of the MIP images confirms the above findings. CT ABDOMEN and PELVIS FINDINGS Hepatobiliary: Multiple simple cysts and at least 3 benign cavernous hemangioma are seen within the liver. No suspicious enhancing intrahepatic masses identified. No intra or extrahepatic biliary ductal dilation. Gallbladder unremarkable. Pancreas: Unremarkable. No pancreatic ductal dilatation or surrounding inflammatory changes. Spleen: Normal in size without focal abnormality.  Adrenals/Urinary Tract: The adrenal glands are  unremarkable. The kidneys are markedly atrophic bilaterally but are otherwise unremarkable. The bladder is decompressed Stomach/Bowel: Stomach is within normal limits. Appendix appears normal. No evidence of bowel wall thickening, distention, or inflammatory changes. Vascular/Lymphatic: Extensive aortoiliac atherosclerotic calcification. No aortic aneurysm. No pathologic adenopathy within the abdomen and pelvis. Reproductive: Prostate is unremarkable. Other: No abdominal wall hernia or abnormality. No abdominopelvic ascites. Musculoskeletal: No acute bone abnormality. No lytic or blastic bone lesion. Lumbar levoscoliosis noted. Degenerative changes noted at the lumbosacral junction. Review of the MIP images confirms the above findings. IMPRESSION: 1. Acute pulmonary embolism. The embolic burden is small. No CT evidence of right heart strain. 2. Left ventricular hypertrophy. 3. Moderate emphysema. 4. No acute intra-abdominal pathology identified. 5. Marked bilateral renal atrophy in keeping with chronic renal failure. 6. Extensive aortoiliac atherosclerotic calcification. Aortic Atherosclerosis (ICD10-I70.0) and Emphysema (ICD10-J43.9). Electronically Signed   By: Fidela Salisbury M.D.   On: 04/19/2022 00:57   DG Chest Portable 1 View  Result Date: 04/19/2022 CLINICAL DATA:  Three weeks history of shortness of breath. EXAM: PORTABLE CHEST 1 VIEW COMPARISON:  CTA chest 11/16/21. 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 No vascular congestion is seen. Right IJ dialysis catheter again noted with tip about the superior cavoatrial junction. Stable mediastinum with aortic tortuosity and calcific plaque. The lungs are emphysematous but clear. No pleural effusion. S shaped thoracolumbar scoliosis. IMPRESSION: 1. No evidence of acute chest process. Emphysema. 2. Aortic atherosclerosis and tortuosity. Electronically Signed   By: Telford Nab M.D.   On: 04/19/2022 00:07    Microbiology: Results for  orders placed or performed during the hospital encounter of 04/26/22  Resp panel by RT-PCR (RSV, Flu A&B, Covid) Anterior Nasal Swab     Status: None   Collection Time: 04/26/22  6:43 PM   Specimen: Anterior Nasal Swab  Result Value Ref Range Status   SARS Coronavirus 2 by RT PCR NEGATIVE NEGATIVE Final    Comment: (NOTE) SARS-CoV-2 target nucleic acids are NOT DETECTED.  The SARS-CoV-2 RNA is generally detectable in upper respiratory specimens during the acute phase of infection. The lowest concentration of SARS-CoV-2 viral copies this assay can detect is 138 copies/mL. A negative result does not preclude SARS-Cov-2 infection and should not be used as the sole basis for treatment or other patient management decisions. A negative result may occur with  improper specimen collection/handling, submission of specimen other than nasopharyngeal swab, presence of viral mutation(s) within the areas targeted by this assay, and inadequate number of viral copies(<138 copies/mL). A negative result must be combined with clinical observations, patient history, and epidemiological information. The expected result is Negative.  Fact Sheet for Patients:  EntrepreneurPulse.com.au  Fact Sheet for Healthcare Providers:  IncredibleEmployment.be  This test is no t yet approved or cleared by the [place] FDA and  has been authorized for detection and/or diagnosis of SARS-CoV-2 by FDA under an Emergency Use Authorization (EUA). This EUA will remain  in effect (meaning this test can be used) for the duration of the COVID-19 declaration under Section 564(b)(1) of the Act, 21 U.S.C.section 360bbb-3(b)(1), unless the authorization is terminated  or revoked sooner.       Influenza A by PCR NEGATIVE NEGATIVE Final   Influenza B by PCR NEGATIVE NEGATIVE Final    Comment: (NOTE) The Xpert Xpress SARS-CoV-2/FLU/RSV plus assay is intended as an aid in the diagnosis of  influenza from Nasopharyngeal swab specimens and should not be used  as a sole basis for treatment. Nasal washings and aspirates are unacceptable for Xpert Xpress SARS-CoV-2/FLU/RSV testing.  Fact Sheet for Patients: EntrepreneurPulse.com.au  Fact Sheet for Healthcare Providers: IncredibleEmployment.be  This test is not yet approved or cleared by the [place] FDA and has been authorized for detection and/or diagnosis of SARS-CoV-2 by FDA under an Emergency Use Authorization (EUA). This EUA will remain in effect (meaning this test can be used) for the duration of the COVID-19 declaration under Section 564(b)(1) of the Act, 21 U.S.C. section 360bbb-3(b)(1), unless the authorization is terminated or revoked.     Resp Syncytial Virus by PCR NEGATIVE NEGATIVE Final    Comment: (NOTE) Fact Sheet for Patients: EntrepreneurPulse.com.au  Fact Sheet for Healthcare Providers: IncredibleEmployment.be  This test is not yet approved or cleared by the [place] FDA and has been authorized for detection and/or diagnosis of SARS-CoV-2 by FDA under an Emergency Use Authorization (EUA). This EUA will remain in effect (meaning this test can be used) for the duration of the COVID-19 declaration under Section 564(b)(1) of the Act, 21 U.S.C. section 360bbb-3(b)(1), unless the authorization is terminated or revoked.  Performed at [facility], [address] [place], [place] [address]     Labs: CBC: Recent Labs  Lab 04/27/22 0535 04/28/22 0345 04/29/22 0407 04/30/22 0908 05/01/22 0611  WBC 22.9* 13.6* 13.3* 9.6 8.3  HGB 11.5* 10.5* 10.9* 10.2* 9.3*  HCT 36.3* 33.7* 35.2* 32.7* 29.8*  MCV 92.1 92.3 93.4 91.6 92.5  PLT 283 251 297 324 [id number]   Basic Metabolic Panel: Recent Labs  Lab 04/27/22 0535 04/28/22 0345 04/29/22 0407 04/30/22 0908 05/01/22 0611  NA 133* 136 136 135 141  K 5.7* 5.0 5.7*  4.2 4.3  CL 94* 95* 94* 95* 101  CO2 '24 27 24 24 25  '$ GLUCOSE 181* 90 80 92 97  BUN 84* 46* 68* 44* 58*  CREATININE 10.42* 6.98* 9.00* 7.08* 9.50*  CALCIUM 7.4* 7.2* 7.2* 7.8* 7.7*  MG 2.0 1.9 2.3 2.1 2.4  PHOS 10.1* 8.8* 9.7* 7.3* 8.9*   Liver Function Tests: Recent Labs  Lab 04/26/22 1518 04/30/22 0908  AST 15 14*  ALT 15 <5  ALKPHOS 52 46  BILITOT 0.5 0.8  PROT 7.2 6.2*  ALBUMIN 3.8 2.6*   CBG: No results for input(s): "GLUCAP" in the last 168 hours.  Discharge time spent: greater than 30 minutes.  Signed: Lucienne Minks , MD [facility] 05/01/2022

## 2022-05-01 NOTE — Progress Notes (Signed)
  Received patient in bed to unit.   Informed consent signed and in chart.    TX duration:3.5     Transported back to floor  with no c/o and no distress noted Hand-off given to patient's nurse.    Access used: R HD catheter Access issues: none   Total UF removed: 1.5L Medication(s) given: none Post HD VS: 181/118 Post HD weight: 53.1KG     Darrol Jump LPN [facility]

## 2022-05-03 ENCOUNTER — Inpatient Hospital Stay
Admission: EM | Admit: 2022-05-03 | Discharge: 2022-05-07 | DRG: 862 | Disposition: A | Discharge status: Home / Self Care | Payer: Medicare Other | Attending: Internal Medicine | Admitting: Internal Medicine

## 2022-05-03 ENCOUNTER — Emergency Department: Payer: Medicare Other

## 2022-05-03 ENCOUNTER — Other Ambulatory Visit: Payer: Self-pay

## 2022-05-03 DIAGNOSIS — I480 Paroxysmal atrial fibrillation: Secondary | ICD-10-CM | POA: Diagnosis present

## 2022-05-03 DIAGNOSIS — Z7901 Long term (current) use of anticoagulants: Secondary | ICD-10-CM

## 2022-05-03 DIAGNOSIS — I4510 Unspecified right bundle-branch block: Secondary | ICD-10-CM | POA: Diagnosis present

## 2022-05-03 DIAGNOSIS — Z79899 Other long term (current) drug therapy: Secondary | ICD-10-CM

## 2022-05-03 DIAGNOSIS — Z87442 Personal history of urinary calculi: Secondary | ICD-10-CM

## 2022-05-03 DIAGNOSIS — I2510 Atherosclerotic heart disease of native coronary artery without angina pectoris: Secondary | ICD-10-CM | POA: Diagnosis present

## 2022-05-03 DIAGNOSIS — N186 End stage renal disease: Secondary | ICD-10-CM

## 2022-05-03 DIAGNOSIS — I252 Old myocardial infarction: Secondary | ICD-10-CM

## 2022-05-03 DIAGNOSIS — J438 Other emphysema: Secondary | ICD-10-CM | POA: Diagnosis present

## 2022-05-03 DIAGNOSIS — I4891 Unspecified atrial fibrillation: Secondary | ICD-10-CM | POA: Diagnosis present

## 2022-05-03 DIAGNOSIS — Z8551 Personal history of malignant neoplasm of bladder: Secondary | ICD-10-CM

## 2022-05-03 DIAGNOSIS — N189 Chronic kidney disease, unspecified: Secondary | ICD-10-CM | POA: Diagnosis present

## 2022-05-03 DIAGNOSIS — E785 Hyperlipidemia, unspecified: Secondary | ICD-10-CM | POA: Diagnosis present

## 2022-05-03 DIAGNOSIS — K921 Melena: Secondary | ICD-10-CM | POA: Diagnosis not present

## 2022-05-03 DIAGNOSIS — T8143XA Infection following a procedure, organ and space surgical site, initial encounter: Secondary | ICD-10-CM | POA: Diagnosis not present

## 2022-05-03 DIAGNOSIS — Z8616 Personal history of COVID-19: Secondary | ICD-10-CM

## 2022-05-03 DIAGNOSIS — I5022 Chronic systolic (congestive) heart failure: Secondary | ICD-10-CM | POA: Diagnosis present

## 2022-05-03 DIAGNOSIS — I160 Hypertensive urgency: Secondary | ICD-10-CM | POA: Diagnosis present

## 2022-05-03 DIAGNOSIS — I482 Chronic atrial fibrillation, unspecified: Secondary | ICD-10-CM | POA: Diagnosis present

## 2022-05-03 DIAGNOSIS — J439 Emphysema, unspecified: Secondary | ICD-10-CM | POA: Diagnosis present

## 2022-05-03 DIAGNOSIS — Z8711 Personal history of peptic ulcer disease: Secondary | ICD-10-CM

## 2022-05-03 DIAGNOSIS — Z87891 Personal history of nicotine dependence: Secondary | ICD-10-CM

## 2022-05-03 DIAGNOSIS — R1011 Right upper quadrant pain: Secondary | ICD-10-CM

## 2022-05-03 DIAGNOSIS — E875 Hyperkalemia: Secondary | ICD-10-CM | POA: Diagnosis present

## 2022-05-03 DIAGNOSIS — I714 Abdominal aortic aneurysm, without rupture, unspecified: Secondary | ICD-10-CM | POA: Diagnosis present

## 2022-05-03 DIAGNOSIS — I739 Peripheral vascular disease, unspecified: Secondary | ICD-10-CM | POA: Diagnosis present

## 2022-05-03 DIAGNOSIS — I420 Dilated cardiomyopathy: Secondary | ICD-10-CM | POA: Diagnosis present

## 2022-05-03 DIAGNOSIS — Z9889 Other specified postprocedural states: Secondary | ICD-10-CM

## 2022-05-03 DIAGNOSIS — Z992 Dependence on renal dialysis: Secondary | ICD-10-CM

## 2022-05-03 DIAGNOSIS — I5042 Chronic combined systolic (congestive) and diastolic (congestive) heart failure: Secondary | ICD-10-CM | POA: Diagnosis present

## 2022-05-03 DIAGNOSIS — I2693 Single subsegmental pulmonary embolism without acute cor pulmonale: Secondary | ICD-10-CM | POA: Diagnosis present

## 2022-05-03 DIAGNOSIS — D631 Anemia in chronic kidney disease: Secondary | ICD-10-CM | POA: Diagnosis present

## 2022-05-03 DIAGNOSIS — I132 Hypertensive heart and chronic kidney disease with heart failure and with stage 5 chronic kidney disease, or end stage renal disease: Secondary | ICD-10-CM | POA: Diagnosis present

## 2022-05-03 DIAGNOSIS — Z951 Presence of aortocoronary bypass graft: Secondary | ICD-10-CM

## 2022-05-03 LAB — COMPREHENSIVE METABOLIC PANEL
ALT: 8 U/L (ref 0–44)
AST: 14 U/L — ABNORMAL LOW (ref 15–41)
Albumin: 2.7 g/dL — ABNORMAL LOW (ref 3.5–5.0)
Alkaline Phosphatase: 47 U/L (ref 38–126)
Anion gap: 12 (ref 5–15)
BUN: 69 mg/dL — ABNORMAL HIGH (ref 8–23)
CO2: 24 mmol/L (ref 22–32)
Calcium: 8.5 mg/dL — ABNORMAL LOW (ref 8.9–10.3)
Chloride: 101 mmol/L (ref 98–111)
Creatinine, Ser: 9.97 mg/dL — ABNORMAL HIGH (ref 0.61–1.24)
GFR, Estimated: 5 mL/min — ABNORMAL LOW (ref >60)
Glucose, Bld: 87 mg/dL (ref 70–99)
Potassium: 6.1 mmol/L — ABNORMAL HIGH (ref 3.5–5.1)
Sodium: 137 mmol/L (ref 135–145)
Total Bilirubin: 0.6 mg/dL (ref 0.3–1.2)
Total Protein: 6.2 g/dL — ABNORMAL LOW (ref 6.5–8.1)

## 2022-05-03 LAB — CBC WITH DIFFERENTIAL/PLATELET
Abs Immature Granulocytes: 0.08 10*3/uL — ABNORMAL HIGH (ref 0.00–0.07)
Basophils Absolute: 0.1 10*3/uL (ref 0.0–0.1)
Basophils Relative: 0 %
Eosinophils Absolute: 0.8 10*3/uL — ABNORMAL HIGH (ref 0.0–0.5)
Eosinophils Relative: 5 %
HCT: 31.6 % — ABNORMAL LOW (ref 39.0–52.0)
Hemoglobin: 9.3 g/dL — ABNORMAL LOW (ref 13.0–17.0)
Immature Granulocytes: 1 %
Lymphocytes Relative: 7 %
Lymphs Abs: 0.9 10*3/uL (ref 0.7–4.0)
MCH: 28.9 pg (ref 26.0–34.0)
MCHC: 29.4 g/dL — ABNORMAL LOW (ref 30.0–36.0)
MCV: 98.1 fL (ref 80.0–100.0)
Monocytes Absolute: 1.2 10*3/uL — ABNORMAL HIGH (ref 0.1–1.0)
Monocytes Relative: 8 %
Neutro Abs: 11.5 10*3/uL — ABNORMAL HIGH (ref 1.7–7.7)
Neutrophils Relative %: 79 %
Platelets: 312 10*3/uL (ref 150–400)
RBC: 3.22 MIL/uL — ABNORMAL LOW (ref 4.22–5.81)
RDW: 16.5 % — ABNORMAL HIGH (ref 11.5–15.5)
WBC: 14.5 10*3/uL — ABNORMAL HIGH (ref 4.0–10.5)
nRBC: 0 % (ref 0.0–0.2)

## 2022-05-03 LAB — LACTIC ACID, PLASMA: Lactic Acid, Venous: 0.7 mmol/L (ref 0.5–1.9)

## 2022-05-03 LAB — LIPASE, BLOOD: Lipase: 57 U/L — ABNORMAL HIGH (ref 11–51)

## 2022-05-03 MED ORDER — IOHEXOL 350 MG/ML SOLN
75.0000 mL | Freq: Once | INTRAVENOUS | Status: AC | PRN
  Administered 2022-05-03: 75 mL via INTRAVENOUS

## 2022-05-03 MED ORDER — SODIUM CHLORIDE 0.9 % IV BOLUS
1000.0000 mL | Freq: Once | INTRAVENOUS | Status: AC
  Administered 2022-05-03: 1000 mL via INTRAVENOUS

## 2022-05-03 MED ORDER — PIPERACILLIN-TAZOBACTAM 3.375 G IVPB 30 MIN
3.3750 g | Freq: Once | INTRAVENOUS | Status: AC
  Administered 2022-05-04: 3.375 g via INTRAVENOUS
  Filled 2022-05-03: qty 50

## 2022-05-03 MED ORDER — FENTANYL CITRATE PF 50 MCG/ML IJ SOSY
50.0000 ug | PREFILLED_SYRINGE | Freq: Once | INTRAMUSCULAR | Status: AC
  Administered 2022-05-03: 50 ug via INTRAVENOUS
  Filled 2022-05-03: qty 1

## 2022-05-03 MED ORDER — CALCIUM GLUCONATE-NACL 1-0.675 GM/50ML-% IV SOLN
1.0000 g | Freq: Once | INTRAVENOUS | Status: AC
  Administered 2022-05-04: 1000 mg via INTRAVENOUS
  Filled 2022-05-03: qty 50

## 2022-05-03 NOTE — ED Provider Notes (Signed)
[facility] Provider Note   Event Date/Time   First MD Initiated Contact with Patient 05/03/22 2100     (approximate) History  Abdominal Pain  HPI Anthony Hess is a 67 y.o. male recently discharged s/p perforated ulcer repair on 04/28/2022 complaining of right upper quadrant pain that has been present since discharge.  Patient denies any prescribed pain medicine as an outpatient.  Patient has been taking his prescribed antibiotics on time.  Patient does endorse nausea as well as 2 episodes of black stool since being discharged from the hospital.  Patient does take Eliquis. ROS: Patient currently denies any vision changes, tinnitus, difficulty speaking, facial droop, sore throat, chest pain, shortness of breath, nausea/vomiting/diarrhea, dysuria, or weakness/numbness/paresthesias in any extremity   Physical Exam  Triage Vital Signs: ED Triage Vitals  Enc Vitals Group     BP 05/03/22 2044 (!) 176/111     Pulse Rate 05/03/22 2044 79     Resp --      Temp 05/03/22 2044 98 F (36.7 C)     Temp Source 05/03/22 2044 Oral     SpO2 05/03/22 2044 100 %     Weight 05/03/22 2045 137 lb (62.1 kg)     Height 05/03/22 2045 '5\' 3"'$  (1.6 m)     Head Circumference --      Peak Flow --      Pain Score 05/03/22 2045 7     Pain Loc --      Pain Edu? --      Excl. in [place]? --    Most recent vital signs: Vitals:   05/03/22 2044 05/03/22 2349  BP: (!) 176/111 (!) 213/125  Pulse: 79 79  Resp:  20  Temp: 98 F (36.7 C)   SpO2: 100% 98%   General: Awake, oriented x4. CV:  Good peripheral perfusion.  Resp:  Normal effort.  Abd:  No distention.  Anterior midline surgical incision and clean dry and without surrounding erythema or induration Other:  Elderly Caucasian male laying in bed in no acute distress ED Results / Procedures / Treatments  Labs (all labs ordered are listed, but only abnormal results are displayed) Labs Reviewed  COMPREHENSIVE METABOLIC PANEL - Abnormal;  Notable for the following components:      Result Value   Potassium 6.1 (*)    BUN 69 (*)    Creatinine, Ser 9.97 (*)    Calcium 8.5 (*)    Total Protein 6.2 (*)    Albumin 2.7 (*)    AST 14 (*)    GFR, Estimated 5 (*)    All other components within normal limits  LIPASE, BLOOD - Abnormal; Notable for the following components:   Lipase 57 (*)    All other components within normal limits  CBC WITH DIFFERENTIAL/PLATELET - Abnormal; Notable for the following components:   WBC 14.5 (*)    RBC 3.22 (*)    Hemoglobin 9.3 (*)    HCT 31.6 (*)    MCHC 29.4 (*)    RDW 16.5 (*)    Neutro Abs 11.5 (*)    Monocytes Absolute 1.2 (*)    Eosinophils Absolute 0.8 (*)    Abs Immature Granulocytes 0.08 (*)    All other components within normal limits  CULTURE, BLOOD (ROUTINE X 2)  CULTURE, BLOOD (ROUTINE X 2)  LACTIC ACID, PLASMA  LACTIC ACID, PLASMA   EKG ED ECG REPORT I, Naaman Plummer, the attending physician, personally viewed and interpreted this  ECG. Date: 05/03/2022 EKG Time: 2346 Rate: 79 Rhythm: normal sinus rhythm QRS Axis: normal Intervals: normal ST/T Wave abnormalities: normal Narrative Interpretation: no evidence of acute ischemia RADIOLOGY ED MD interpretation: CT of the abdomen and pelvis with IV contrast shows slightly more organized 3.5 x 1.9 cm organized fluid collection along the posterior inferior right hepatic lobe likely representing an abscess -Agree with radiology assessment Official radiology report(s): CT Abdomen Pelvis W Contrast  Result Date: 05/03/2022 CLINICAL DATA:  Abdominal pain, post-op reporting pain to abdominal incision site that has worsened since getting discharged from hospital 05/01/22. States he had surgery on March 4th for perforated bowel. EXAM: CT ABDOMEN AND PELVIS WITH CONTRAST TECHNIQUE: Multidetector CT imaging of the abdomen and pelvis was performed using the standard protocol following bolus administration of intravenous contrast.  RADIATION DOSE REDUCTION: This exam was performed according to the departmental dose-optimization program which includes automated exposure control, adjustment of the mA and/or kV according to patient size and/or use of iterative reconstruction technique. CONTRAST:  76m OMNIPAQUE IOHEXOL 350 MG/ML SOLN COMPARISON:  CT abdomen pelvis 04/29/2022, CT abdomen pelvis 04/26/2022, CT angio chest and abdomen pelvis 04/19/2022 FINDINGS: Lower chest: Left lower lobe subsegmental pulmonary artery filling defect consistent with pulmonary embolus. 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 Slightly more organized 3.5 x 1.9 cm organized fluid collection along the posterior inferior right hepatic lobe likely representing an abscess (6:47). Associated trace perihepatic simple free fluid. Redemonstration of adjacent fat density of unclear etiology (6:40)- possibly postsurgical in the setting of duodenal ulcer perforation repair. Associated mild pericholecystic free fluid-not increased from prior. No gallbladder wall thickening. No CT evidence of cholelithiasis. No biliary dilatation. Pancreas: No focal lesion. Normal pancreatic contour. No surrounding inflammatory changes. No main pancreatic ductal dilatation. Spleen: Normal in size without focal abnormality. Adrenals/Urinary Tract: No adrenal nodule bilaterally. Bilateral atrophic kidneys. Bilateral kidneys enhance symmetrically. Subcentimeter hypodensities are too small to characterize. No hydronephrosis. No hydroureter. The urinary bladder is unremarkable. Stomach/Bowel: PO contrast reaches the rectum. Stomach is within normal limits. No evidence of bowel wall thickening or dilatation. Appendix appears normal. Vascular/Lymphatic: No abdominal aorta or iliac aneurysm. Severe atherosclerotic plaque of the aorta and its branches. No abdominal, pelvic, or inguinal lymphadenopathy. Reproductive: Prostate is unremarkable. Other:  Stranding of the right pericolic gutter with associated trace fluid. Mild perihepatic simple free fluid. Mild pericholecystic simple free fluid. Interval decrease of small volume scattered free foci of gas within the upper abdomen likely related to postsurgical changes. No organized fluid collection. Musculoskeletal: No abdominal wall hernia or abnormality. No suspicious lytic or blastic osseous lesions. No acute displaced fracture. Levoscoliosis of the thoracolumbar spine. IMPRESSION: 1. Slightly more organized 3.5 x 1.9 cm organized fluid collection along the posterior inferior right hepatic lobe likely representing an abscess. Persistent grossly stable associated right upper quadrant and pericolic gutter trace free fluid and inflammatory changes. Finding does appear to be contiguous with the gallbladder fossa with however no increased free fluid or gas to suggest a biliary leak. 2. Interval decrease of small volume scattered free foci of gas within the upper abdomen likely related to postsurgical changes. 3. Persistent left lower lobe subsegmental pulmonary embolus. No associated heart strain or visualized pulmonary infarction. Electronically Signed   By: MIven FinnM.D.   On: 05/03/2022 22:51   PROCEDURES: Critical Care performed: No Procedures MEDICATIONS ORDERED IN ED: Medications  piperacillin-tazobactam (ZOSYN) IVPB 3.375 g (has no administration in time range)  calcium  gluconate 1 g/ 50 mL sodium chloride IVPB (1,000 mg Intravenous New Bag/Given 05/04/22 0010)  iohexol (OMNIPAQUE) 350 MG/ML injection 75 mL (75 mLs Intravenous Contrast Given 05/03/22 2218)  sodium chloride 0.9 % bolus 1,000 mL (1,000 mLs Intravenous New Bag/Given 05/03/22 2348)  fentaNYL (SUBLIMAZE) injection 50 mcg (50 mcg Intravenous Given 05/03/22 2349)   IMPRESSION / MDM / ASSESSMENT AND PLAN / ED COURSE  I reviewed the triage vital signs and the nursing notes.                             The patient is on the cardiac  monitor to evaluate for evidence of arrhythmia and/or significant heart rate changes. Patient's presentation is most consistent with acute presentation with potential threat to life or bodily function.  This patient presents to the ED for concern of right upper quadrant abdominal pain, this involves an extensive number of treatment options, and is a complaint that carries with it a high risk of complications and morbidity.  The differential diagnosis includes perforated ulcer, postsurgical infection/abscess, sepsis, gastritis Co morbidities that complicate the patient evaluation  Recent perforated duodenal ulcer repair on 04/26/2022 Additional history obtained:  External records from outside source obtained and reviewed including operative note from 04/26/2022 Lab Tests:  I Ordered, and personally interpreted labs.  The pertinent results include: WBC 14.5, hemoglobin 9.3, potassium 6.1, creatinine 9.97 Imaging Studies ordered:  I ordered imaging studies including CT of the abdomen and pelvis  I independently visualized and interpreted imaging which showed increased right upper quadrant fluid collection concerning for abscess  I agree with the radiologist interpretation Cardiac Monitoring: / EKG:  The patient was maintained on a cardiac monitor.  I personally viewed and interpreted the cardiac monitored which showed an underlying rhythm of: Normal sinus rhythm Consultations Obtained:  I requested consultation with the general surgery, Dr. Peyton Najjar,  and discussed lab and imaging findings as well as pertinent plan - they recommend: Admission to medicine for antibiotic therapy Problem List / ED Course / Critical interventions / Medication management  Intra-abdominal abscess, hyperkalemia  I ordered medication including calcium gluconate, Zosyn for infection and hyperkalemia  Reevaluation of the patient after these medicines showed that the patient improved  I have reviewed the patients home  medicines and have made adjustments as needed Dispo: Admit to medicine       FINAL CLINICAL IMPRESSION(S) / ED DIAGNOSES   Final diagnoses:  Postprocedural intraabdominal abscess  Right upper quadrant abdominal pain  Hyperkalemia   Rx / DC Orders   ED Discharge Orders     None      Note:  This document was prepared using Dragon voice recognition software and may include unintentional dictation errors.   Naaman Plummer, MD 05/04/22 [phone]

## 2022-05-03 NOTE — ED Triage Notes (Signed)
Arrived via EMS from home reporting pain to abdominal incision site that has worsened since getting discharged from hospital 05/01/22. States he had surgery on March 4th for perforated bowel. Patient reports two episodes of black stool since getting released from hospital. AOX4. Resp even, unlabored on RA. Takes Eliquis.

## 2022-05-03 NOTE — ED Provider Notes (Incomplete)
[facility] Provider Note   Event Date/Time   First MD Initiated Contact with Patient 05/03/22 2100     (approximate) History  Abdominal Pain  HPI Anthony Hess is a 67 y.o. male recently discharged s/p perforated ulcer repair on 04/28/2022 complaining of right upper quadrant pain that has been present since discharge.  Patient denies any prescribed pain medicine as an outpatient.  Patient has been taking his prescribed antibiotics on time.  Patient does endorse nausea as well as 2 episodes of black stool since being discharged from the hospital.  Patient does take Eliquis. ROS: Patient currently denies any vision changes, tinnitus, difficulty speaking, facial droop, sore throat, chest pain, shortness of breath, nausea/vomiting/diarrhea, dysuria, or weakness/numbness/paresthesias in any extremity   Physical Exam  Triage Vital Signs: ED Triage Vitals  Enc Vitals Group     BP 05/03/22 2044 (!) 176/111     Pulse Rate 05/03/22 2044 79     Resp --      Temp 05/03/22 2044 98 F (36.7 C)     Temp Source 05/03/22 2044 Oral     SpO2 05/03/22 2044 100 %     Weight 05/03/22 2045 137 lb (62.1 kg)     Height 05/03/22 2045 '5\' 3"'$  (1.6 m)     Head Circumference --      Peak Flow --      Pain Score 05/03/22 2045 7     Pain Loc --      Pain Edu? --      Excl. in [place]? --    Most recent vital signs: Vitals:   05/03/22 2044  BP: (!) 176/111  Pulse: 79  Temp: 98 F (36.7 C)  SpO2: 100%   General: Awake, oriented x4. CV:  Good peripheral perfusion.  Resp:  Normal effort.  Abd:  No distention.  Anterior midline surgical incision and clean dry and without surrounding erythema or induration Other:  Elderly Caucasian male laying in bed in no acute distress ED Results / Procedures / Treatments  Labs (all labs ordered are listed, but only abnormal results are displayed) Labs Reviewed  COMPREHENSIVE METABOLIC PANEL - Abnormal; Notable for the following components:       Result Value   Potassium 6.1 (*)    BUN 69 (*)    Creatinine, Ser 9.97 (*)    Calcium 8.5 (*)    Total Protein 6.2 (*)    Albumin 2.7 (*)    AST 14 (*)    GFR, Estimated 5 (*)    All other components within normal limits  LIPASE, BLOOD - Abnormal; Notable for the following components:   Lipase 57 (*)    All other components within normal limits  CBC WITH DIFFERENTIAL/PLATELET - Abnormal; Notable for the following components:   WBC 14.5 (*)    RBC 3.22 (*)    Hemoglobin 9.3 (*)    HCT 31.6 (*)    MCHC 29.4 (*)    RDW 16.5 (*)    Neutro Abs 11.5 (*)    Monocytes Absolute 1.2 (*)    Eosinophils Absolute 0.8 (*)    Abs Immature Granulocytes 0.08 (*)    All other components within normal limits  CULTURE, BLOOD (ROUTINE X 2)  CULTURE, BLOOD (ROUTINE X 2)  LACTIC ACID, PLASMA  LACTIC ACID, PLASMA   EKG ED ECG REPORT I, Naaman Plummer, the attending physician, personally viewed and interpreted this ECG. Date: 05/03/2022 EKG Time: *** Rate: *** Rhythm: normal sinus  rhythm QRS Axis: normal Intervals: normal ST/T Wave abnormalities: normal Narrative Interpretation: no evidence of acute ischemia RADIOLOGY ED MD interpretation: CT of the abdomen and pelvis with IV contrast shows slightly more organized 3.5 x 1.9 cm organized fluid collection along the posterior inferior right hepatic lobe likely representing an abscess -Agree with radiology assessment Official radiology report(s): CT Abdomen Pelvis W Contrast  Result Date: 05/03/2022 CLINICAL DATA:  Abdominal pain, post-op reporting pain to abdominal incision site that has worsened since getting discharged from hospital 05/01/22. States he had surgery on March 4th for perforated bowel. EXAM: CT ABDOMEN AND PELVIS WITH CONTRAST TECHNIQUE: Multidetector CT imaging of the abdomen and pelvis was performed using the standard protocol following bolus administration of intravenous contrast. RADIATION DOSE REDUCTION: This exam was performed  according to the departmental dose-optimization program which includes automated exposure control, adjustment of the mA and/or kV according to patient size and/or use of iterative reconstruction technique. CONTRAST:  30m OMNIPAQUE IOHEXOL 350 MG/ML SOLN COMPARISON:  CT abdomen pelvis 04/29/2022, CT abdomen pelvis 04/26/2022, CT angio chest and abdomen pelvis 04/19/2022 FINDINGS: Lower chest: Left lower lobe subsegmental pulmonary artery filling defect consistent with pulmonary embolus. 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 Slightly more organized 3.5 x 1.9 cm organized fluid collection along the posterior inferior right hepatic lobe likely representing an abscess (6:47). Associated trace perihepatic simple free fluid. Redemonstration of adjacent fat density of unclear etiology (6:40)- possibly postsurgical in the setting of duodenal ulcer perforation repair. Associated mild pericholecystic free fluid-not increased from prior. No gallbladder wall thickening. No CT evidence of cholelithiasis. No biliary dilatation. Pancreas: No focal lesion. Normal pancreatic contour. No surrounding inflammatory changes. No main pancreatic ductal dilatation. Spleen: Normal in size without focal abnormality. Adrenals/Urinary Tract: No adrenal nodule bilaterally. Bilateral atrophic kidneys. Bilateral kidneys enhance symmetrically. Subcentimeter hypodensities are too small to characterize. No hydronephrosis. No hydroureter. The urinary bladder is unremarkable. Stomach/Bowel: PO contrast reaches the rectum. Stomach is within normal limits. No evidence of bowel wall thickening or dilatation. Appendix appears normal. Vascular/Lymphatic: No abdominal aorta or iliac aneurysm. Severe atherosclerotic plaque of the aorta and its branches. No abdominal, pelvic, or inguinal lymphadenopathy. Reproductive: Prostate is unremarkable. Other: Stranding of the right pericolic gutter with associated  trace fluid. Mild perihepatic simple free fluid. Mild pericholecystic simple free fluid. Interval decrease of small volume scattered free foci of gas within the upper abdomen likely related to postsurgical changes. No organized fluid collection. Musculoskeletal: No abdominal wall hernia or abnormality. No suspicious lytic or blastic osseous lesions. No acute displaced fracture. Levoscoliosis of the thoracolumbar spine. IMPRESSION: 1. Slightly more organized 3.5 x 1.9 cm organized fluid collection along the posterior inferior right hepatic lobe likely representing an abscess. Persistent grossly stable associated right upper quadrant and pericolic gutter trace free fluid and inflammatory changes. Finding does appear to be contiguous with the gallbladder fossa with however no increased free fluid or gas to suggest a biliary leak. 2. Interval decrease of small volume scattered free foci of gas within the upper abdomen likely related to postsurgical changes. 3. Persistent left lower lobe subsegmental pulmonary embolus. No associated heart strain or visualized pulmonary infarction. Electronically Signed   By: MIven FinnM.D.   On: 05/03/2022 22:51   PROCEDURES: Critical Care performed: No Procedures MEDICATIONS ORDERED IN ED: Medications  piperacillin-tazobactam (ZOSYN) IVPB 3.375 g (has no administration in time range)  sodium chloride 0.9 % bolus 1,000 mL (has no administration in time  range)  calcium gluconate 1 g/ 50 mL sodium chloride IVPB (has no administration in time range)  fentaNYL (SUBLIMAZE) injection 50 mcg (has no administration in time range)  iohexol (OMNIPAQUE) 350 MG/ML injection 75 mL (75 mLs Intravenous Contrast Given 05/03/22 2218)   IMPRESSION / MDM / ASSESSMENT AND PLAN / ED COURSE  I reviewed the triage vital signs and the nursing notes.                             The patient is on the cardiac monitor to evaluate for evidence of arrhythmia and/or significant heart rate  changes. Patient's presentation is most consistent with acute presentation with potential threat to life or bodily function.  This patient presents to the ED for concern of right upper quadrant abdominal pain, this involves an extensive number of treatment options, and is a complaint that carries with it a high risk of complications and morbidity.  The differential diagnosis includes perforated ulcer, postsurgical infection/abscess, sepsis, gastritis Co morbidities that complicate the patient evaluation  Recent perforated duodenal ulcer repair on 04/26/2022 Additional history obtained:  External records from outside source obtained and reviewed including operative note from 04/26/2022 Lab Tests:  I Ordered, and personally interpreted labs.  The pertinent results include: WBC 14.5, hemoglobin 9.3, potassium 6.1, creatinine 9.97 Imaging Studies ordered:  I ordered imaging studies including CT of the abdomen and pelvis  I independently visualized and interpreted imaging which showed increased right upper quadrant fluid collection concerning for abscess  I agree with the radiologist interpretation Cardiac Monitoring: / EKG:  The patient was maintained on a cardiac monitor.  I personally viewed and interpreted the cardiac monitored which showed an underlying rhythm of: *** Consultations Obtained:  I requested consultation with the general surgery, Dr. Peyton Najjar,  and discussed lab and imaging findings as well as pertinent plan - they recommend: Admission to medicine for antibiotic therapy Problem List / ED Course / Critical interventions / Medication management  Intra-abdominal abscess, hyperkalemia  I ordered medication including calcium gluconate, Zosyn for infection and hyperkalemia  Reevaluation of the patient after these medicines showed that the patient improved  I have reviewed the patients home medicines and have made adjustments as needed Dispo: Admit to medicine       FINAL  CLINICAL IMPRESSION(S) / ED DIAGNOSES   Final diagnoses:  Postprocedural intraabdominal abscess   Rx / DC Orders   ED Discharge Orders     None      Note:  This document was prepared using Dragon voice recognition software and may include unintentional dictation errors.

## 2022-05-04 DIAGNOSIS — Z87891 Personal history of nicotine dependence: Secondary | ICD-10-CM | POA: Diagnosis not present

## 2022-05-04 DIAGNOSIS — I420 Dilated cardiomyopathy: Secondary | ICD-10-CM | POA: Diagnosis present

## 2022-05-04 DIAGNOSIS — K921 Melena: Secondary | ICD-10-CM | POA: Diagnosis present

## 2022-05-04 DIAGNOSIS — I132 Hypertensive heart and chronic kidney disease with heart failure and with stage 5 chronic kidney disease, or end stage renal disease: Secondary | ICD-10-CM | POA: Diagnosis present

## 2022-05-04 DIAGNOSIS — I739 Peripheral vascular disease, unspecified: Secondary | ICD-10-CM | POA: Diagnosis present

## 2022-05-04 DIAGNOSIS — I480 Paroxysmal atrial fibrillation: Secondary | ICD-10-CM | POA: Diagnosis present

## 2022-05-04 DIAGNOSIS — E875 Hyperkalemia: Secondary | ICD-10-CM | POA: Diagnosis present

## 2022-05-04 DIAGNOSIS — I714 Abdominal aortic aneurysm, without rupture, unspecified: Secondary | ICD-10-CM | POA: Diagnosis present

## 2022-05-04 DIAGNOSIS — Z951 Presence of aortocoronary bypass graft: Secondary | ICD-10-CM | POA: Diagnosis not present

## 2022-05-04 DIAGNOSIS — T8143XA Infection following a procedure, organ and space surgical site, initial encounter: Principal | ICD-10-CM

## 2022-05-04 DIAGNOSIS — I4510 Unspecified right bundle-branch block: Secondary | ICD-10-CM | POA: Diagnosis present

## 2022-05-04 DIAGNOSIS — I160 Hypertensive urgency: Secondary | ICD-10-CM | POA: Diagnosis present

## 2022-05-04 DIAGNOSIS — Z9889 Other specified postprocedural states: Secondary | ICD-10-CM

## 2022-05-04 DIAGNOSIS — D631 Anemia in chronic kidney disease: Secondary | ICD-10-CM | POA: Diagnosis present

## 2022-05-04 DIAGNOSIS — I2510 Atherosclerotic heart disease of native coronary artery without angina pectoris: Secondary | ICD-10-CM | POA: Diagnosis present

## 2022-05-04 DIAGNOSIS — Z992 Dependence on renal dialysis: Secondary | ICD-10-CM | POA: Diagnosis not present

## 2022-05-04 DIAGNOSIS — Z7901 Long term (current) use of anticoagulants: Secondary | ICD-10-CM | POA: Diagnosis not present

## 2022-05-04 DIAGNOSIS — Z8616 Personal history of COVID-19: Secondary | ICD-10-CM | POA: Diagnosis not present

## 2022-05-04 DIAGNOSIS — N186 End stage renal disease: Secondary | ICD-10-CM | POA: Diagnosis present

## 2022-05-04 DIAGNOSIS — I2693 Single subsegmental pulmonary embolism without acute cor pulmonale: Secondary | ICD-10-CM | POA: Diagnosis present

## 2022-05-04 DIAGNOSIS — E785 Hyperlipidemia, unspecified: Secondary | ICD-10-CM | POA: Diagnosis present

## 2022-05-04 DIAGNOSIS — J439 Emphysema, unspecified: Secondary | ICD-10-CM | POA: Diagnosis present

## 2022-05-04 DIAGNOSIS — K651 Peritoneal abscess: Secondary | ICD-10-CM

## 2022-05-04 DIAGNOSIS — Z8551 Personal history of malignant neoplasm of bladder: Secondary | ICD-10-CM | POA: Diagnosis not present

## 2022-05-04 DIAGNOSIS — I5042 Chronic combined systolic (congestive) and diastolic (congestive) heart failure: Secondary | ICD-10-CM | POA: Diagnosis present

## 2022-05-04 HISTORY — DX: Melena: K92.1

## 2022-05-04 LAB — POTASSIUM: Potassium: 5.5 mmol/L — ABNORMAL HIGH (ref 3.5–5.1)

## 2022-05-04 LAB — HEMOGLOBIN: Hemoglobin: 10.2 g/dL — ABNORMAL LOW (ref 13.0–17.0)

## 2022-05-04 LAB — LACTIC ACID, PLASMA: Lactic Acid, Venous: 0.5 mmol/L (ref 0.5–1.9)

## 2022-05-04 MED ORDER — PATIROMER SORBITEX CALCIUM 8.4 G PO PACK
16.8000 g | PACK | Freq: Every day | ORAL | Status: DC
  Filled 2022-05-04: qty 2

## 2022-05-04 MED ORDER — PIPERACILLIN-TAZOBACTAM IN DEX 2-0.25 GM/50ML IV SOLN
2.2500 g | Freq: Three times a day (TID) | INTRAVENOUS | Status: DC
  Administered 2022-05-04 – 2022-05-07 (×10): 2.25 g via INTRAVENOUS
  Filled 2022-05-04 (×12): qty 50

## 2022-05-04 MED ORDER — PANTOPRAZOLE SODIUM 40 MG IV SOLR
40.0000 mg | INTRAVENOUS | Status: DC
  Administered 2022-05-04 – 2022-05-06 (×4): 40 mg via INTRAVENOUS
  Filled 2022-05-04 (×4): qty 10

## 2022-05-04 MED ORDER — HEPARIN SODIUM (PORCINE) 1000 UNIT/ML IJ SOLN
INTRAMUSCULAR | Status: AC
  Filled 2022-05-04: qty 10

## 2022-05-04 MED ORDER — HYDRALAZINE HCL 20 MG/ML IJ SOLN
5.0000 mg | INTRAMUSCULAR | Status: AC | PRN
  Administered 2022-05-04: 5 mg via INTRAVENOUS
  Filled 2022-05-04: qty 1

## 2022-05-04 MED ORDER — ONDANSETRON HCL 4 MG PO TABS: 4.0000 mg | ORAL_TABLET | Freq: Four times a day (QID) | ORAL | Status: DC | PRN

## 2022-05-04 MED ORDER — CHLORHEXIDINE GLUCONATE CLOTH 2 % EX PADS
6.0000 | MEDICATED_PAD | Freq: Every day | CUTANEOUS | Status: DC
  Administered 2022-05-04 – 2022-05-07 (×4): 6 via TOPICAL
  Filled 2022-05-04: qty 6

## 2022-05-04 MED ORDER — HEPARIN SODIUM (PORCINE) 1000 UNIT/ML DIALYSIS: 100.0000 [IU]/kg | INTRAMUSCULAR | Status: DC | PRN

## 2022-05-04 MED ORDER — HYDRALAZINE HCL 20 MG/ML IJ SOLN
10.0000 mg | Freq: Once | INTRAMUSCULAR | Status: AC
  Administered 2022-05-04: 10 mg via INTRAVENOUS
  Filled 2022-05-04: qty 1

## 2022-05-04 MED ORDER — MORPHINE SULFATE (PF) 2 MG/ML IV SOLN
2.0000 mg | INTRAVENOUS | Status: DC | PRN
  Administered 2022-05-04 – 2022-05-06 (×10): 2 mg via INTRAVENOUS
  Filled 2022-05-04 (×10): qty 1

## 2022-05-04 MED ORDER — PATIROMER SORBITEX CALCIUM 8.4 G PO PACK
16.8000 g | PACK | Freq: Once | ORAL | Status: AC
  Administered 2022-05-04: 16.8 g via ORAL
  Filled 2022-05-04: qty 2

## 2022-05-04 MED ORDER — ACETAMINOPHEN 650 MG RE SUPP: 650.0000 mg | Freq: Four times a day (QID) | RECTAL | Status: DC | PRN

## 2022-05-04 MED ORDER — ONDANSETRON HCL 4 MG/2ML IJ SOLN: 4.0000 mg | Freq: Four times a day (QID) | INTRAMUSCULAR | Status: DC | PRN

## 2022-05-04 MED ORDER — ACETAMINOPHEN 325 MG PO TABS: 650.0000 mg | ORAL_TABLET | Freq: Four times a day (QID) | ORAL | Status: DC | PRN

## 2022-05-04 MED ORDER — HYDRALAZINE HCL 20 MG/ML IJ SOLN
5.0000 mg | INTRAMUSCULAR | Status: DC | PRN
  Administered 2022-05-04: 5 mg via INTRAVENOUS
  Filled 2022-05-04: qty 1

## 2022-05-04 NOTE — Progress Notes (Signed)
       CROSS COVER NOTE  NAME: Anthony Hess MRN: [id number] DOB : Feb 01, 1956 ATTENDING PHYSICIAN: Athena Masse, MD    Date of Service   05/04/2022   HPI/Events of Note   Message received from RN reporting current BP 193/124 and 5 mg of IV hydralazine at 0035 ineffective. IV morphine given 15 mins ago. Home BP meds held while NPO. Anthony Hess has required higher doses of hydralazine during previous hospitalizations.  Interventions   Assessment/Plan: 10 mg IV hydralazine x1      To reach the provider On-Call:   7AM- 7PM see care teams to locate the attending and reach out to them via [email or web address] Password: [email or web address] 7PM-7AM contact night-coverage If you still have difficulty reaching the appropriate provider, please page the [facility] (Director on Call) for [facility] on amion for assistance  This document was prepared using Systems analyst and may include unintentional dictation errors.  Neomia Glass DNP, MBA, FNP-BC, PMHNP-BC Nurse Practitioner [facility] Pager [phone]

## 2022-05-04 NOTE — Consult Note (Signed)
SURGICAL CONSULTATION NOTE   HISTORY OF PRESENT ILLNESS (HPI):  67 y.o. male presented to [facility] ED for evaluation of abdominal pain. Patient reports having upper abdominal pain for the last few days.  He endorses that the pain has been getting worse.  He cannot identify any alleviating or aggravating factors.  Pain radiates to the right side of the abdomen.  Patient denies any fever.  Patient denies any nausea or vomiting.  He does endorses melena.  At the ED he was found with increased white blood cell count.  He had a CT scan of the abdomen pelvis that shows a 3.5 cm abscess in the right upper quadrant.  There is no increase free air.  I personally evaluated the images.  Surgery is consulted by Dr. Damita Dunnings in this context for evaluation and management of intra-abdominal abscess.  PAST MEDICAL HISTORY (PMH):  Past Medical History:  Diagnosis Date   Aortic atherosclerosis ([place])    Bilateral carotid artery disease ([place])    Bladder cancer ([place])    Coronary artery disease 12/20/2018   a.) LHC 12/20/2018: 50% OM1, 40% OM2, 95% o-pLAD, 75% o=pLCx, 40% mLM, 70% D1, 60% mRCA-1, 50% mRCA-2; refer to CVTS. b.) 4v CABG at [facility] on 12/27/2018: LIMA-LAD, RIMA-PDA, seg LRA-OM1-D1   DCM (dilated cardiomyopathy) ([place]) 12/05/2018   a.) TTE 12/05/2018: EF 40-45%. b.) TTE 12/28/2019: EF 20-25%.   ESRD (end stage renal disease) ([place])    a.) T-Th-Sat   HFrEF (heart failure with reduced ejection fraction) ([place]) 12/05/2018   a.) TTE 12/05/2018: EF 40-45%; mild LVH; ant/apical/sep HK; mild TR . b.) TTE 12/28/2019: EF 20-25%; mod LVH; mod MR/AR; G1DD.   History of 2019 novel coronavirus disease (COVID-19) 04/08/2021   History of kidney stones    HLD (hyperlipidemia)    Hypertension    Infrarenal abdominal aortic aneurysm (AAA) without rupture ([place]) 03/05/2021   a.) CT abd/pelvis; measured 3.2 cm.   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   a.) LIMA-LAD,  RIMA-PDA, sequential LEFT radial artery to OM1 and D1   Sepsis ([place]) 03/14/2021   Wears glasses      PAST SURGICAL HISTORY ([place]):  Past Surgical History:  Procedure Laterality Date   AV FISTULA PLACEMENT Left 07/30/2021   Procedure: INSERTION OF ARTERIOVENOUS (AV) GORE-TEX GRAFT ARM BRACHIAL ARTERY TO AXILLARY VEIN;  Surgeon: Algernon Huxley, MD;  Location: [facility] ORS;  Service: Vascular;  Laterality: Left;   CAPD INSERTION N/A 12/31/2019   Procedure: LAPAROSCOPIC INSERTION CONTINUOUS AMBULATORY PERITONEAL DIALYSIS  (CAPD) CATHETER;  Surgeon: Jules Husbands, MD;  Location: [facility] ORS;  Service: General;  Laterality: N/A;   CAPD REMOVAL N/A 04/10/2020   Procedure: LAPAROSCOPIC REVISION OF CONTINUOUS AMBULATORY PERITONEAL DIALYSIS  (CAPD) CATHETER;  Surgeon: Jules Husbands, MD;  Location: [facility] ORS;  Service: General;  Laterality: N/A;   CORONARY ARTERY BYPASS GRAFT N/A 12/27/2018   Procedure: CORONARY ARTERY BYPASS GRAFTING (CABG) X 4 ON PUMP USING RIGHT & LEFT INTERNAL MAMMARY ARTERY LEFT RADIAL ARTERY ENDOSCOPICALLY HARVESTED;  Surgeon: [name], MD;  Location: [place];  Service: Open Heart Surgery;  Laterality: N/A;   CYSTOSCOPY W/ RETROGRADES Bilateral 05/15/2019   Procedure: CYSTOSCOPY WITH RETROGRADE PYELOGRAM;  Surgeon: Abbie Sons, MD;  Location: [facility] ORS;  Service: Urology;  Laterality: Bilateral;   CYSTOSCOPY WITH BIOPSY N/A 05/15/2019   Procedure: CYSTOSCOPY WITH bladder BIOPSY;  Surgeon: Abbie Sons, MD;  Location: [facility]  ORS;  Service: Urology;  Laterality: N/A;   DIALYSIS/PERMA CATHETER INSERTION N/A 12/28/2019   Procedure: DIALYSIS/PERMA CATHETER INSERTION;  Surgeon: Algernon Huxley, MD;  Location: [place] CV LAB;  Service: Cardiovascular;  Laterality: N/A;   DIALYSIS/PERMA CATHETER INSERTION N/A 03/18/2021   Procedure: DIALYSIS/PERMA CATHETER INSERTION;  Surgeon: Algernon Huxley, MD;  Location: [place] CV LAB;  Service: Cardiovascular;  Laterality: N/A;   DIALYSIS/PERMA  CATHETER REMOVAL N/A 06/02/2020   Procedure: DIALYSIS/PERMA CATHETER REMOVAL;  Surgeon: Algernon Huxley, MD;  Location: [place] CV LAB;  Service: Cardiovascular;  Laterality: N/A;   EXCHANGE OF A DIALYSIS CATHETER Right 04/10/2020   Procedure: EXCHANGE OF A DIALYSIS CATHETER;  Surgeon: Jules Husbands, MD;  Location: [facility];  Service: General;  Laterality: Right;   [place]  01/20/2021   Procedure: HERNIA REPAIR INCISIONAL;  Surgeon: Olean Ree, MD;  Location: [facility] OR[facility];  Service: General;;   IR IMAGE GUIDED DRAINAGE PERCUT CATH  PERITONEAL RETROPERIT  04/07/2020   LAPAROSCOPY N/A 04/16/2021   Procedure: LAPAROSCOPY DIAGNOSTIC;  Surgeon: Benjamine Sprague, DO;  Location: [facility];  Service: General;  Laterality: N/A;   LAPAROTOMY N/A 04/26/2022   Procedure: EXPLORATORY LAPAROTOMY WITH REPAIR OF DUODENAL ULCER;  Surgeon: Herbert Pun, MD;  Location: [facility];  Service: General;  Laterality: N/A;   LEFT HEART CATH AND CORONARY ANGIOGRAPHY Left 12/20/2018   Procedure: LEFT HEART CATH AND CORONARY ANGIOGRAPHY;  Surgeon: Isaias Cowman, MD;  Location: [place] CV LAB;  Service: Cardiovascular;  Laterality: Left;   RADIAL ARTERY HARVEST Left 12/27/2018   Procedure: ENDOSCOPIC RADIAL ARTERY HARVEST;  Surgeon: [name], MD;  Location: [place];  Service: Open Heart Surgery;  Laterality: Left;   REMOVAL OF A DIALYSIS CATHETER Left 03/20/2021   Procedure: REMOVAL OF A PD CATHETER;  Surgeon: Algernon Huxley, MD;  Location: [facility];  Service: Vascular;  Laterality: Left;   REVISION OF ARTERIOVENOUS GORETEX GRAFT Left 09/11/2021   Procedure: Excisionof infected AV graft;  Surgeon: Katha Cabal, MD;  Location: [facility];  Service: Vascular;  Laterality: Left;   TEE WITHOUT CARDIOVERSION N/A 12/27/2018   Procedure: TRANSESOPHAGEAL ECHOCARDIOGRAM (TEE);  Surgeon: [name], MD;  Location: [place];  Service: Open Heart Surgery;  Laterality: N/A;   TUMOR REMOVAL  2019    Bladder     MEDICATIONS:  Prior to Admission medications   Medication Sig Start Date End Date Taking? Authorizing Provider  acetaminophen (TYLENOL) 500 MG tablet Take 2 tablets (1,000 mg total) by mouth every 6 (six) hours as needed for mild pain, fever or headache (or Fever >/= 101). 11/18/21  Yes Emeterio Reeve, DO  amoxicillin-clavulanate (AUGMENTIN) 500-125 MG tablet Take 1 tablet by mouth daily for 3 days. 05/01/22 05/04/22 Yes Lucienne Minks, MD  apixaban (ELIQUIS) 5 MG TABS tablet Take 1 tablet (5 mg total) by mouth 2 (two) times daily. 05/01/22  Yes Lucienne Minks, MD  atorvastatin (LIPITOR) 80 MG tablet Take 1 tablet (80 mg total) by mouth daily. 04/21/22  Yes Jennye Boroughs, MD  calcitRIOL (ROCALTROL) 0.5 MCG capsule Take 1 capsule (0.5 mcg total) by mouth daily. 11/18/21  Yes Emeterio Reeve, DO  calcium acetate (PHOSLO) 667 MG capsule Take 1 capsule (667 mg total) by mouth 3 (three) times daily with meals. 11/18/21  Yes Emeterio Reeve, DO  hydrALAZINE (APRESOLINE) 25 MG tablet Take 1 tablet (25 mg total) by mouth 3 (three) times daily. 04/21/22  Yes Jennye Boroughs, MD  isosorbide dinitrate (  ISORDIL) 10 MG tablet Take 1 tablet (10 mg total) by mouth 3 (three) times daily. 04/21/22  Yes Jennye Boroughs, MD  metoprolol succinate (TOPROL-XL) 25 MG 24 hr tablet Take 25 mg by mouth daily. 03/12/22  Yes [provider]  multivitamin (RENA-VIT) TABS tablet Take 1 tablet by mouth at bedtime. 03/23/21  Yes Nicole Kindred A, DO  pantoprazole (PROTONIX) 40 MG tablet Take 1 tablet (40 mg total) by mouth 2 (two) times daily. 05/01/22 05/31/22 Yes Lucienne Minks, MD  sucroferric oxyhydroxide (VELPHORO) 500 MG chewable tablet Chew 1 tablet (500 mg total) by mouth 3 (three) times daily with meals. 11/18/21  Yes Emeterio Reeve, DO  Vitamin D, Ergocalciferol, (DRISDOL) 1.25 MG (50000 UNIT) CAPS capsule Take 1 capsule (50,000 Units total) by mouth every 7 (seven) days. 11/18/21  Yes Emeterio Reeve,  DO     ALLERGIES:  No Known Allergies   SOCIAL HISTORY:  Social History   Socioeconomic History   Marital status: Married    Spouse name: Not on file   Number of children: Not on file   Years of education: Not on file   Highest education level: Not on file  Occupational History   Not on file  Tobacco Use   Smoking status: Former    Packs/day: 0.25    Types: Cigarettes    Quit date: 02/19/2019    Years since quitting: 3.2   Smokeless tobacco: Never  Vaping Use   Vaping Use: Never used  Substance and Sexual Activity   Alcohol use: Never   Drug use: Never   Sexual activity: Yes  Other Topics Concern   Not on file  Social History Narrative   Not on file   Social Determinants of Health   Financial Resource Strain: Not on file  Food Insecurity: No Food Insecurity (04/27/2022)   Hunger Vital Sign    Worried About Running Out of Food in the Last Year: Never true    Ran Out of Food in the Last Year: Never true  Transportation Needs: No Transportation Needs (04/27/2022)   PRAPARE - Hydrologist (Medical): No    Lack of Transportation (Non-Medical): No  Physical Activity: Not on file  Stress: Not on file  Social Connections: Not on file  Intimate Partner Violence: Not At Risk (04/27/2022)   Humiliation, Afraid, Rape, and Kick questionnaire    Fear of Current or Ex-Partner: No    Emotionally Abused: No    Physically Abused: No    Sexually Abused: No      FAMILY HISTORY:  Family History  Family history unknown: Yes     REVIEW OF SYSTEMS:  Constitutional: denies weight loss, fever, chills, or sweats  Eyes: denies any other vision changes, history of eye injury  ENT: denies sore throat, hearing problems  Respiratory: denies shortness of breath, wheezing  Cardiovascular: denies chest pain, palpitations  Gastrointestinal: abdominal pain, nausea and vomiting Genitourinary: denies burning with urination or urinary frequency Musculoskeletal:  denies any other joint pains or cramps  Skin: denies any other rashes or skin discolorations  Neurological: denies any other headache, dizziness, weakness  Psychiatric: denies any other depression, anxiety   All other review of systems were negative   VITAL SIGNS:  Temp:  [97.7 F (36.5 C)-98.6 F (37 C)] 97.7 F (36.5 C) (03/12 0954) Pulse Rate:  [70-80] 75 (03/12 0944) Resp:  [10-22] 13 (03/12 0944) BP: (154-226)/(90-143) 168/111 (03/12 0830) SpO2:  [92 %-100 %] 96 % (03/12 0954)  Weight:  [57.1 kg-62.1 kg] 57.1 kg (03/12 1000)     Height: '5\' 3"'$  (160 cm) Weight: 57.1 kg BMI (Calculated): 22.3   INTAKE/OUTPUT:  This shift: No intake/output data recorded.  Last 2 shifts: '@IOLAST2SHIFTS'$ @   PHYSICAL EXAM:  Constitutional:  -- Normal body habitus  -- Awake, alert, and oriented x3  Eyes:  -- Pupils equally round and reactive to light  -- No scleral icterus  Ear, nose, and throat:  -- No jugular venous distension  Pulmonary:  -- No crackles  -- Equal breath sounds bilaterally -- Breathing non-labored at rest Cardiovascular:  -- S1, S2 present  -- No pericardial rubs Gastrointestinal:  -- Abdomen soft, nontender, non-distended, no guarding or rebound tenderness -- No abdominal masses appreciated, pulsatile or otherwise  Musculoskeletal and Integumentary:  -- Wounds: Midline wound is dry and clean. -- Extremities: B/L UE and LE FROM, hands and feet warm, no edema  Neurologic:  -- Motor function: intact and symmetric -- Sensation: intact and symmetric   Labs:     Latest Ref Rng & Units 05/04/2022    5:07 AM 05/03/2022   10:34 PM 05/01/2022    6:11 AM  CBC  WBC 4.0 - 10.5 K/uL  14.5  8.3   Hemoglobin 13.0 - 17.0 g/dL 10.2  9.3  9.3   Hematocrit 39.0 - 52.0 %  31.6  29.8   Platelets 150 - 400 K/uL  312  323       Latest Ref Rng & Units 05/04/2022    5:07 AM 05/03/2022   10:34 PM 05/01/2022    6:11 AM  CMP  Glucose 70 - 99 mg/dL  87  97   BUN 8 - 23 mg/dL  69  58    Creatinine 0.61 - 1.24 mg/dL  9.97  9.50   Sodium 135 - 145 mmol/L  137  141   Potassium 3.5 - 5.1 mmol/L 5.5  6.1  4.3   Chloride 98 - 111 mmol/L  101  101   CO2 22 - 32 mmol/L  24  25   Calcium 8.9 - 10.3 mg/dL  8.5  7.7   Total Protein 6.5 - 8.1 g/dL  6.2    Total Bilirubin 0.3 - 1.2 mg/dL  0.6    Alkaline Phos 38 - 126 U/L  47    AST 15 - 41 U/L  14    ALT 0 - 44 U/L  8      Imaging studies:  EXAM: CT ABDOMEN AND PELVIS WITH CONTRAST   TECHNIQUE: Multidetector CT imaging of the abdomen and pelvis was performed using the standard protocol following bolus administration of intravenous contrast.   RADIATION DOSE REDUCTION: This exam was performed according to the departmental dose-optimization program which includes automated exposure control, adjustment of the mA and/or kV according to patient size and/or use of iterative reconstruction technique.   CONTRAST:  67m OMNIPAQUE IOHEXOL 350 MG/ML SOLN   COMPARISON:  CT abdomen pelvis 04/29/2022, CT abdomen pelvis 04/26/2022, CT angio chest and abdomen pelvis 04/19/2022   FINDINGS: Lower chest: Left lower lobe subsegmental pulmonary artery filling defect consistent with pulmonary embolus.   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 Slightly more organized 3.5 x 1.9 cm organized fluid collection along the posterior inferior right hepatic lobe likely representing an abscess (6:47). Associated trace perihepatic simple free fluid. Redemonstration of adjacent fat density of unclear etiology (6:40)- possibly postsurgical in the setting of duodenal ulcer perforation repair.  Associated mild pericholecystic free fluid-not increased from prior. No gallbladder wall thickening. No CT evidence of cholelithiasis. No biliary dilatation.   Pancreas: No focal lesion. Normal pancreatic contour. No surrounding inflammatory changes. No main pancreatic ductal dilatation.   Spleen: Normal  in size without focal abnormality.   Adrenals/Urinary Tract:   No adrenal nodule bilaterally.   Bilateral atrophic kidneys. Bilateral kidneys enhance symmetrically. Subcentimeter hypodensities are too small to characterize.   No hydronephrosis. No hydroureter.   The urinary bladder is unremarkable.   Stomach/Bowel: PO contrast reaches the rectum. Stomach is within normal limits. No evidence of bowel wall thickening or dilatation. Appendix appears normal.   Vascular/Lymphatic: No abdominal aorta or iliac aneurysm. Severe atherosclerotic plaque of the aorta and its branches. No abdominal, pelvic, or inguinal lymphadenopathy.   Reproductive: Prostate is unremarkable.   Other: Stranding of the right pericolic gutter with associated trace fluid. Mild perihepatic simple free fluid. Mild pericholecystic simple free fluid. Interval decrease of small volume scattered free foci of gas within the upper abdomen likely related to postsurgical changes. No organized fluid collection.   Musculoskeletal:   No abdominal wall hernia or abnormality.   No suspicious lytic or blastic osseous lesions. No acute displaced fracture. Levoscoliosis of the thoracolumbar spine.   IMPRESSION: 1. Slightly more organized 3.5 x 1.9 cm organized fluid collection along the posterior inferior right hepatic lobe likely representing an abscess. Persistent grossly stable associated right upper quadrant and pericolic gutter trace free fluid and inflammatory changes. Finding does appear to be contiguous with the gallbladder fossa with however no increased free fluid or gas to suggest a biliary leak. 2. Interval decrease of small volume scattered free foci of gas within the upper abdomen likely related to postsurgical changes. 3. Persistent left lower lobe subsegmental pulmonary embolus. No associated heart strain or visualized pulmonary infarction.     Electronically Signed   By: Iven Finn M.D.   On:  05/03/2022 22:51  Assessment/Plan:  67 y.o. male with intra-abdominal abscess, complicated by pertinent comorbidities including end-stage renal disease on hemodialysis, hypertension, recent duodenal perforation.  Patient with small right upper quadrant abscess.  I discussed case with IR and they agreed to proceed with percutaneous drainage after holding Eliquis.  Will put patient n.p.o. after midnight.  Continue IV antibiotic therapy.   Arnold Long, MD

## 2022-05-04 NOTE — Assessment & Plan Note (Addendum)
Clinically euvolemic Continue Imdur and metoprolol.

## 2022-05-04 NOTE — Assessment & Plan Note (Addendum)
S/p laparotomy for for perforated duodenal perforation on 3/4 Patient presents with abdominal pain and had 2 melanotic stools CT abdomen showing right hepatic lobe fluid collection. IR did a procedure to collect some fluid from that fluid collection.  So far no organisms seen and no white blood cells seen.  Changed to Augmentin upon going home.  General surgery will follow-up on the cultures for the next few days and adjust antibiotics if needed but hopefully this is just a fluid collection and not an abscess.

## 2022-05-04 NOTE — ED Notes (Signed)
Pt given sandwich box. Pt denies any additional needs or concerns at this time.

## 2022-05-04 NOTE — Assessment & Plan Note (Addendum)
Hemoglobin 8.9 on discharge.  Patient had no further bowel movements here in the hospital.  I do not think he is actively bleeding.

## 2022-05-04 NOTE — Assessment & Plan Note (Addendum)
Restarted oral medications.  Continue Norvasc 2.5 mg daily which was added here in the hospital.

## 2022-05-04 NOTE — Consult Note (Cosign Needed Addendum)
Chief Complaint: Patient was seen in consultation today for intra-abdominal fluid collection at the request of  Herbert Pun, MD  Referring Physician(s): Herbert Pun, MD  Supervising Physician: Corrie Mckusick  Patient Status: [place] - In-pt  History of Present Illness: Anthony Hess is a 67 y.o. male who presented to [facility] ED 05/03/2022 complaining of right upper quadrant pain.  Patient had recently been discharged 04/28/2022 status post perforated ulcer repair.  CT AP at that time demonstrated slightly more organized 3.5 x 1.9 cm fluid collection along posterior inferior right hepatic lobe likely representing an abscess.  Patient was referred to IR for intra-abdominal fluid collection drainage.  Dr. Serafina Royals, IR, reviewed imaging and approved intra-abdominal abscess drain placement.  Last dose Eliquis 05/03/2022.  Procedure tentatively scheduled for 05/05/2022.  Pt denies fever, chills, SOB, loss of appetite, N/V/D, dizziness or HA.  He endorses fatigue, CP, RUQ pain and weakness.  He understands he will not eat after MN.  LD Eliquis was 05/03/22  Past Medical History:  Diagnosis Date   Aortic atherosclerosis ([place])    Bilateral carotid artery disease ([place])    Bladder cancer ([place])    Coronary artery disease 12/20/2018   a.) LHC 12/20/2018: 50% OM1, 40% OM2, 95% o-pLAD, 75% o=pLCx, 40% mLM, 70% D1, 60% mRCA-1, 50% mRCA-2; refer to CVTS. b.) 4v CABG at [facility] on 12/27/2018: LIMA-LAD, RIMA-PDA, seg LRA-OM1-D1   DCM (dilated cardiomyopathy) ([place]) 12/05/2018   a.) TTE 12/05/2018: EF 40-45%. b.) TTE 12/28/2019: EF 20-25%.   ESRD (end stage renal disease) ([place])    a.) T-Th-Sat   HFrEF (heart failure with reduced ejection fraction) ([place]) 12/05/2018   a.) TTE 12/05/2018: EF 40-45%; mild LVH; ant/apical/sep HK; mild TR . b.) TTE 12/28/2019: EF 20-25%; mod LVH; mod MR/AR; G1DD.   History of 2019 novel coronavirus disease (COVID-19) 04/08/2021   History of kidney stones    HLD  (hyperlipidemia)    Hypertension    Infrarenal abdominal aortic aneurysm (AAA) without rupture ([place]) 03/05/2021   a.) CT abd/pelvis; measured 3.2 cm.   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   a.) LIMA-LAD, RIMA-PDA, sequential LEFT radial artery to OM1 and D1   Sepsis ([place]) 03/14/2021   Wears glasses     Past Surgical History:  Procedure Laterality Date   AV FISTULA PLACEMENT Left 07/30/2021   Procedure: INSERTION OF ARTERIOVENOUS (AV) GORE-TEX GRAFT ARM BRACHIAL ARTERY TO AXILLARY VEIN;  Surgeon: Algernon Huxley, MD;  Location: [facility] ORS;  Service: Vascular;  Laterality: Left;   CAPD INSERTION N/A 12/31/2019   Procedure: LAPAROSCOPIC INSERTION CONTINUOUS AMBULATORY PERITONEAL DIALYSIS  (CAPD) CATHETER;  Surgeon: Jules Husbands, MD;  Location: [facility] OR[facility];  Service: General;  Laterality: N/A;   CAPD REMOVAL N/A 04/10/2020   Procedure: LAPAROSCOPIC REVISION OF CONTINUOUS AMBULATORY PERITONEAL DIALYSIS  (CAPD) CATHETER;  Surgeon: Jules Husbands, MD;  Location: [facility];  Service: General;  Laterality: N/A;   CORONARY ARTERY BYPASS GRAFT N/A 12/27/2018   Procedure: CORONARY ARTERY BYPASS GRAFTING (CABG) X 4 ON PUMP USING RIGHT & LEFT INTERNAL MAMMARY ARTERY LEFT RADIAL ARTERY ENDOSCOPICALLY HARVESTED;  Surgeon: [name], MD;  Location: [place];  Service: Open Heart Surgery;  Laterality: N/A;   CYSTOSCOPY W/ RETROGRADES Bilateral 05/15/2019   Procedure: CYSTOSCOPY WITH RETROGRADE PYELOGRAM;  Surgeon: Abbie Sons, MD;  Location: [facility];  Service: Urology;  Laterality: Bilateral;   CYSTOSCOPY WITH BIOPSY N/A 05/15/2019  Procedure: CYSTOSCOPY WITH bladder BIOPSY;  Surgeon: Abbie Sons, MD;  Location: [facility];  Service: Urology;  Laterality: N/A;   DIALYSIS/PERMA CATHETER INSERTION N/A 12/28/2019   Procedure: DIALYSIS/PERMA CATHETER INSERTION;  Surgeon: Algernon Huxley, MD;  Location: [place] CV [facility];  Service: Cardiovascular;   Laterality: N/A;   DIALYSIS/PERMA CATHETER INSERTION N/A 03/18/2021   Procedure: DIALYSIS/PERMA CATHETER INSERTION;  Surgeon: Algernon Huxley, MD;  Location: [place] [facility];  Service: Cardiovascular;  Laterality: N/A;   DIALYSIS/PERMA CATHETER REMOVAL N/A 06/02/2020   Procedure: DIALYSIS/PERMA CATHETER REMOVAL;  Surgeon: Algernon Huxley, MD;  Location: [place] CV [facility];  Service: Cardiovascular;  Laterality: N/A;   EXCHANGE OF A DIALYSIS CATHETER Right 04/10/2020   Procedure: EXCHANGE OF A DIALYSIS CATHETER;  Surgeon: Jules Husbands, MD;  Location: [facility];  Service: General;  Laterality: Right;   [place]  01/20/2021   Procedure: HERNIA REPAIR INCISIONAL;  Surgeon: Olean Ree, MD;  Location: [facility];  Service: General;;   IR IMAGE GUIDED DRAINAGE PERCUT CATH  PERITONEAL RETROPERIT  04/07/2020   LAPAROSCOPY N/A 04/16/2021   Procedure: LAPAROSCOPY DIAGNOSTIC;  Surgeon: Benjamine Sprague, DO;  Location: [facility];  Service: General;  Laterality: N/A;   LAPAROTOMY N/A 04/26/2022   Procedure: EXPLORATORY LAPAROTOMY WITH REPAIR OF DUODENAL ULCER;  Surgeon: Herbert Pun, MD;  Location: [facility];  Service: General;  Laterality: N/A;   LEFT HEART CATH AND CORONARY ANGIOGRAPHY Left 12/20/2018   Procedure: LEFT HEART CATH AND CORONARY ANGIOGRAPHY;  Surgeon: Isaias Cowman, MD;  Location: [place] [facility];  Service: Cardiovascular;  Laterality: Left;   RADIAL ARTERY HARVEST Left 12/27/2018   Procedure: ENDOSCOPIC RADIAL ARTERY HARVEST;  Surgeon: [name], MD;  Location: [place];  Service: Open Heart Surgery;  Laterality: Left;   REMOVAL OF A DIALYSIS CATHETER Left 03/20/2021   Procedure: REMOVAL OF A PD CATHETER;  Surgeon: Algernon Huxley, MD;  Location: [facility];  Service: Vascular;  Laterality: Left;   REVISION OF ARTERIOVENOUS GORETEX GRAFT Left 09/11/2021   Procedure: Excisionof infected AV graft;  Surgeon: Katha Cabal, MD;  Location: [facility];  Service: Vascular;   Laterality: Left;   TEE WITHOUT CARDIOVERSION N/A 12/27/2018   Procedure: TRANSESOPHAGEAL ECHOCARDIOGRAM (TEE);  Surgeon: [name], MD;  Location: [place];  Service: Open Heart Surgery;  Laterality: N/A;   TUMOR REMOVAL  2019   Bladder    Allergies: Patient has no known allergies.  Medications: Prior to Admission medications   Medication Sig Start Date End Date Taking? Authorizing Provider  acetaminophen (TYLENOL) 500 MG tablet Take 2 tablets (1,000 mg total) by mouth every 6 (six) hours as needed for mild pain, fever or headache (or Fever >/= 101). 11/18/21  Yes Emeterio Reeve, DO  amoxicillin-clavulanate (AUGMENTIN) 500-125 MG tablet Take 1 tablet by mouth daily for 3 days. 05/01/22 05/04/22 Yes Lucienne Minks, MD  apixaban (ELIQUIS) 5 MG TABS tablet Take 1 tablet (5 mg total) by mouth 2 (two) times daily. 05/01/22  Yes Lucienne Minks, MD  atorvastatin (LIPITOR) 80 MG tablet Take 1 tablet (80 mg total) by mouth daily. 04/21/22  Yes Jennye Boroughs, MD  calcitRIOL (ROCALTROL) 0.5 MCG capsule Take 1 capsule (0.5 mcg total) by mouth daily. 11/18/21  Yes Emeterio Reeve, DO  calcium acetate (PHOSLO) 667 MG capsule Take 1 capsule (667 mg total) by mouth 3 (three) times daily with meals. 11/18/21  Yes Emeterio Reeve, DO  hydrALAZINE (APRESOLINE) 25 MG tablet Take 1  tablet (25 mg total) by mouth 3 (three) times daily. 04/21/22  Yes Jennye Boroughs, MD  isosorbide dinitrate (ISORDIL) 10 MG tablet Take 1 tablet (10 mg total) by mouth 3 (three) times daily. 04/21/22  Yes Jennye Boroughs, MD  metoprolol succinate (TOPROL-XL) 25 MG 24 hr tablet Take 25 mg by mouth daily. 03/12/22  Yes [provider]  multivitamin (RENA-VIT) TABS tablet Take 1 tablet by mouth at bedtime. 03/23/21  Yes Nicole Kindred A, DO  pantoprazole (PROTONIX) 40 MG tablet Take 1 tablet (40 mg total) by mouth 2 (two) times daily. 05/01/22 05/31/22 Yes Lucienne Minks, MD  sucroferric oxyhydroxide (VELPHORO) 500 MG chewable  tablet Chew 1 tablet (500 mg total) by mouth 3 (three) times daily with meals. 11/18/21  Yes Emeterio Reeve, DO  Vitamin D, Ergocalciferol, (DRISDOL) 1.25 MG (50000 UNIT) CAPS capsule Take 1 capsule (50,000 Units total) by mouth every 7 (seven) days. 11/18/21  Yes Emeterio Reeve, DO     Family History  Family history unknown: Yes    Social History   Socioeconomic History   Marital status: Married    Spouse name: Not on file   Number of children: Not on file   Years of education: Not on file   Highest education level: Not on file  Occupational History   Not on file  Tobacco Use   Smoking status: Former    Packs/day: 0.25    Types: Cigarettes    Quit date: 02/19/2019    Years since quitting: 3.2   Smokeless tobacco: Never  Vaping Use   Vaping Use: Never used  Substance and Sexual Activity   Alcohol use: Never   Drug use: Never   Sexual activity: Yes  Other Topics Concern   Not on file  Social History Narrative   Not on file   Social Determinants of Health   Financial Resource Strain: Not on file  Food Insecurity: No Food Insecurity (04/27/2022)   Hunger Vital Sign    Worried About Running Out of Food in the Last Year: Never true    Ran Out of Food in the Last Year: Never true  Transportation Needs: No Transportation Needs (04/27/2022)   PRAPARE - Hydrologist (Medical): No    Lack of Transportation (Non-Medical): No  Physical Activity: Not on file  Stress: Not on file  Social Connections: Not on file    Review of Systems: A 12 point ROS discussed and pertinent positives are indicated in the HPI above.  All other systems are negative.  Review of Systems  Constitutional:  Positive for fatigue. Negative for appetite change, chills and fever.  Respiratory:  Negative for shortness of breath.   Cardiovascular:  Positive for chest pain.  Gastrointestinal:  Positive for abdominal pain. Negative for diarrhea, nausea and vomiting.   Neurological:  Positive for weakness. Negative for dizziness and headaches.    Vital Signs: BP (!) 168/111   Pulse 79   Temp 98.4 F (36.9 C)   Resp 10   Ht '5\' 3"'$  (1.6 m)   Wt 137 lb (62.1 kg)   SpO2 95%   BMI 24.27 kg/m     Physical Exam Vitals reviewed.  Constitutional:      General: He is not in acute distress.    Appearance: Normal appearance. He is ill-appearing.  HENT:     Head: Normocephalic and atraumatic.     Mouth/Throat:     Mouth: Mucous membranes are dry.     Pharynx:  Oropharynx is clear.  Eyes:     Extraocular Movements: Extraocular movements intact.     Pupils: Pupils are equal, round, and reactive to light.  Cardiovascular:     Rate and Rhythm: Normal rate and regular rhythm.     Pulses: Normal pulses.     Heart sounds: Normal heart sounds. No murmur heard. Pulmonary:     Effort: Pulmonary effort is normal. No respiratory distress.     Breath sounds: Normal breath sounds.  Abdominal:     General: Bowel sounds are normal. There is no distension.     Tenderness: There is abdominal tenderness. There is no guarding.     Comments: Midline incision with staples in place  Musculoskeletal:     Right lower leg: No edema.     Left lower leg: No edema.  Skin:    General: Skin is warm and dry.  Neurological:     Mental Status: He is alert and oriented to person, place, and time.  Psychiatric:        Mood and Affect: Mood normal.        Behavior: Behavior normal.        Thought Content: Thought content normal.        Judgment: Judgment normal.     Imaging: CT Abdomen Pelvis W Contrast  Result Date: 05/03/2022 CLINICAL DATA:  Abdominal pain, post-op reporting pain to abdominal incision site that has worsened since getting discharged from hospital 05/01/22. States he had surgery on March 4th for perforated bowel. EXAM: CT ABDOMEN AND PELVIS WITH CONTRAST TECHNIQUE: Multidetector CT imaging of the abdomen and pelvis was performed using the standard  protocol following bolus administration of intravenous contrast. RADIATION DOSE REDUCTION: This exam was performed according to the departmental dose-optimization program which includes automated exposure control, adjustment of the mA and/or kV according to patient size and/or use of iterative reconstruction technique. CONTRAST:  19m OMNIPAQUE IOHEXOL 350 MG/ML SOLN COMPARISON:  CT abdomen pelvis 04/29/2022, CT abdomen pelvis 04/26/2022, CT angio chest and abdomen pelvis 04/19/2022 FINDINGS: Lower chest: Left lower lobe subsegmental pulmonary artery filling defect consistent with pulmonary embolus. 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 Slightly more organized 3.5 x 1.9 cm organized fluid collection along the posterior inferior right hepatic lobe likely representing an abscess (6:47). Associated trace perihepatic simple free fluid. Redemonstration of adjacent fat density of unclear etiology (6:40)- possibly postsurgical in the setting of duodenal ulcer perforation repair. Associated mild pericholecystic free fluid-not increased from prior. No gallbladder wall thickening. No CT evidence of cholelithiasis. No biliary dilatation. Pancreas: No focal lesion. Normal pancreatic contour. No surrounding inflammatory changes. No main pancreatic ductal dilatation. Spleen: Normal in size without focal abnormality. Adrenals/Urinary Tract: No adrenal nodule bilaterally. Bilateral atrophic kidneys. Bilateral kidneys enhance symmetrically. Subcentimeter hypodensities are too small to characterize. No hydronephrosis. No hydroureter. The urinary bladder is unremarkable. Stomach/Bowel: PO contrast reaches the rectum. Stomach is within normal limits. No evidence of bowel wall thickening or dilatation. Appendix appears normal. Vascular/Lymphatic: No abdominal aorta or iliac aneurysm. Severe atherosclerotic plaque of the aorta and its branches. No abdominal, pelvic, or inguinal  lymphadenopathy. Reproductive: Prostate is unremarkable. Other: Stranding of the right pericolic gutter with associated trace fluid. Mild perihepatic simple free fluid. Mild pericholecystic simple free fluid. Interval decrease of small volume scattered free foci of gas within the upper abdomen likely related to postsurgical changes. No organized fluid collection. Musculoskeletal: No abdominal wall hernia or abnormality. No suspicious lytic or  blastic osseous lesions. No acute displaced fracture. Levoscoliosis of the thoracolumbar spine. IMPRESSION: 1. Slightly more organized 3.5 x 1.9 cm organized fluid collection along the posterior inferior right hepatic lobe likely representing an abscess. Persistent grossly stable associated right upper quadrant and pericolic gutter trace free fluid and inflammatory changes. Finding does appear to be contiguous with the gallbladder fossa with however no increased free fluid or gas to suggest a biliary leak. 2. Interval decrease of small volume scattered free foci of gas within the upper abdomen likely related to postsurgical changes. 3. Persistent left lower lobe subsegmental pulmonary embolus. No associated heart strain or visualized pulmonary infarction. Electronically Signed   By: Iven Finn M.D.   On: 05/03/2022 22:51   CT ABDOMEN WO CONTRAST  Result Date: 04/29/2022 CLINICAL DATA:  Status post surgical repair of perforated duodenal ulcer. EXAM: CT ABDOMEN WITHOUT CONTRAST TECHNIQUE: Multidetector CT imaging of the abdomen was performed following the standard protocol without IV contrast. RADIATION DOSE REDUCTION: This exam was performed according to the departmental dose-optimization program which includes automated exposure control, adjustment of the mA and/or kV according to patient size and/or use of iterative reconstruction technique. COMPARISON:  April 26, 2022. FINDINGS: Lower chest: Small bilateral pleural effusions are noted. Right lower lobe subsegmental  atelectasis is noted. Hepatobiliary: Right hepatic cyst is noted. No cholelithiasis or biliary dilatation is noted. Pancreas: Unremarkable. No pancreatic ductal dilatation or surrounding inflammatory changes. Spleen: Normal in size without focal abnormality. Adrenals/Urinary Tract: Adrenal glands appear normal. Bilateral renal atrophy is noted consistent with end-stage renal disease. No hydronephrosis or renal obstruction is noted. Stomach/Bowel: The stomach is unremarkable. Contrast is seen filling the stomach, proximal small bowel as well as the colon. No definite contrast extravasation is noted. Vascular/Lymphatic: Aortic atherosclerosis. No enlarged abdominal lymph nodes. Other: Surgical drain is seen in right upper quadrant. Mild amount of pneumoperitoneum is noted consistent with recent postoperative status. No definite abnormal fluid collection is noted within the visualized portion of the abdomen. Musculoskeletal: No acute or significant osseous findings. IMPRESSION: Status post surgical repair of perforated duodenal ulcer. No definite contrast extravasation is noted in the visualized portion of the abdomen currently. Surgical drain is noted in right upper quadrant. Mild pneumoperitoneum is noted consistent with postoperative status. Small bilateral pleural effusions are noted. Probable right lower lobe subsegmental atelectasis is noted. Bilateral renal atrophy is noted consistent with end-stage renal disease. Aortic Atherosclerosis (ICD10-I70.0). Electronically Signed   By: Marijo Conception M.D.   On: 04/29/2022 09:01   DG UGI W SINGLE CM (SOL OR THIN BA)  Result Date: 04/28/2022 CLINICAL DATA:  Patient is 2 days status post duodenal ulcer repair. EXAM: DG UGI W SINGLE CM TECHNIQUE: Single contrast examination was then performed using thin liquid barium. This exam was performed by Reatha Armour, PA-C , and was supervised and interpreted by Dr Kathreen Devoid. FLUOROSCOPY: Radiation Exposure Index (as provided  by the fluoroscopic device): 10.20 mGy Kerma COMPARISON:  None Available. FINDINGS: Esophagus:  Normal appearance. Esophageal motility:  Within normal limits. Gastroesophageal reflux:  None visualized. Ingested 46m barium tablet: Not given Stomach: Normal appearance. No hiatal hernia. Gastric emptying: Normal. Duodenum: Normal appearance. No contrast extravasation visualized on today's exam. Other:  None. IMPRESSION: Limited upper GI study conducted secondary to limited mobility and limited ability to tolerate contrast material. No extraluminal contrast to suggest a duodenal leak or perforation. If there is persistent clinical concern, recommend a CT of the abdomen with oral contrast only. Electronically Signed  By: Kathreen Devoid M.D.   On: 04/28/2022 11:05   CT ABDOMEN PELVIS WO CONTRAST  Result Date: 04/26/2022 CLINICAL DATA:  Right lower quadrant abdominal pain. EXAM: CT ABDOMEN AND PELVIS WITHOUT CONTRAST TECHNIQUE: Multidetector CT imaging of the abdomen and pelvis was performed following the standard protocol without IV contrast. RADIATION DOSE REDUCTION: This exam was performed according to the departmental dose-optimization program which includes automated exposure control, adjustment of the mA and/or kV according to patient size and/or use of iterative reconstruction technique. COMPARISON:  CT abdomen pelvis dated 04/19/2022. FINDINGS: Evaluation of this exam is limited in the absence of intravenous contrast. Evaluation is also limited due to respiratory motion and paucity of intra-abdominal fat. Lower chest: The visualized lung bases are clear. There is pneumoperitoneum and small ascites. Hepatobiliary: A 2 cm hypodense lesion in the dome of the liver not characterized on this CT but was present on the prior CT. Small hypodense lesion in the left lobe of the liver also present on the prior CT. A small cyst noted in the right lobe 1 of the liver inferiorly. No dilatation. There is layering sludge and  small stones within the gallbladder. No pericholecystic fluid. Pancreas: The pancreas is grossly unremarkable as visualized. Spleen: Normal in size without focal abnormality. Adrenals/Urinary Tract: The adrenal glands are unremarkable. Moderate bilateral renal parenchyma atrophy. There is no hydronephrosis or nephrolithiasis on either side. The urinary bladder is minimally distended and grossly unremarkable. Stomach/Bowel: There is no bowel obstruction. The appendix is unremarkable as visualized. Vascular/Lymphatic: Advanced aortoiliac atherosclerotic disease. The aorta is tortuous. The IVC is grossly unremarkable. No obvious adenopathy. Reproductive: Mildly enlarged prostate gland measuring 4.5 cm in transverse axial diameter. Other: None Musculoskeletal: Degenerative changes of the spine and scoliosis. No acute osseous pathology. IMPRESSION: 1. Pneumoperitoneum and small ascites. Findings are concerning for bowel perforation. Surgical consult is advised. 2. Cholelithiasis. 3. No hydronephrosis or nephrolithiasis. 4. No bowel obstruction. Normal appendix. 5.  Aortic Atherosclerosis (ICD10-I70.0). These results were called by telephone at the time of interpretation on 04/26/2022 at 4:49 pm to provider [facility] , who verbally acknowledged these results. Electronically Signed   By: Anner Crete M.D.   On: 04/26/2022 16:51   DG Chest Portable 1 View  Result Date: 04/26/2022 CLINICAL DATA:  Shortness of breath, abdominal pain EXAM: PORTABLE CHEST 1 VIEW COMPARISON:  Previous studies including the examination of 04/19/2022 FINDINGS: Transverse diameter of heart is slightly increased. Central pulmonary vessels are slightly prominent. Thoracic aorta is tortuous and ectatic. Tip of dialysis catheter is seen at the junction of superior vena cava and right atrium. There is previous coronary bypass surgery. Faint lucency is seen in the right hemidiaphragm. Patient is scheduled for CT abdomen and pelvis.  Dextroscoliosis is seen in thoracic spine. IMPRESSION: There are no signs of pulmonary edema or focal pulmonary consolidation. There is no pleural effusion or pneumothorax. Faint lucencies are seen under the right hemidiaphragm suggesting possible pneumoperitoneum. Electronically Signed   By: Elmer Picker M.D.   On: 04/26/2022 16:43   US Venous Img Lower Bilateral (DVT)  Result Date: 04/20/2022 CLINICAL DATA:  History of smoking. History of previous DVT. Evaluate for acute or chronic DVT. EXAM: BILATERAL LOWER EXTREMITY VENOUS DOPPLER ULTRASOUND TECHNIQUE: Gray-scale sonography with graded compression, as well as color Doppler and duplex ultrasound were performed to evaluate the lower extremity deep venous systems from the level of the common femoral vein and including the common femoral, femoral, profunda femoral, popliteal and calf veins including  the posterior tibial, peroneal and gastrocnemius veins when visible. The superficial great saphenous vein was also interrogated. Spectral Doppler was utilized to evaluate flow at rest and with distal augmentation maneuvers in the common femoral, femoral and popliteal veins. COMPARISON:  Chest CT-04/19/2022 FINDINGS: RIGHT LOWER EXTREMITY Common Femoral Vein: No evidence of thrombus. Normal compressibility, respiratory phasicity and response to augmentation. Saphenofemoral Junction: No evidence of thrombus. Normal compressibility and flow on color Doppler imaging. Profunda Femoral Vein: No evidence of thrombus. Normal compressibility and flow on color Doppler imaging. Femoral Vein: No evidence of thrombus. Normal compressibility, respiratory phasicity and response to augmentation. Popliteal Vein: No evidence of thrombus. Normal compressibility, respiratory phasicity and response to augmentation. Calf Veins: No evidence of thrombus. Normal compressibility and flow on color Doppler imaging. Superficial Great Saphenous Vein: No evidence of thrombus. Normal  compressibility. Other Findings:  None. LEFT LOWER EXTREMITY Common Femoral Vein: No evidence of thrombus. Normal compressibility, respiratory phasicity and response to augmentation. Saphenofemoral Junction: No evidence of thrombus. Normal compressibility and flow on color Doppler imaging. Profunda Femoral Vein: No evidence of thrombus. Normal compressibility and flow on color Doppler imaging. Femoral Vein: No evidence of thrombus. Normal compressibility, respiratory phasicity and response to augmentation. Popliteal Vein: No evidence of thrombus. Normal compressibility, respiratory phasicity and response to augmentation. Calf Veins: No evidence of thrombus. Normal compressibility and flow on color Doppler imaging. Superficial Great Saphenous Vein: No evidence of thrombus. Normal compressibility. Other Findings:  None. IMPRESSION: No evidence of acute or chronic DVT within either lower extremity. Electronically Signed   By: Sandi Mariscal M.D.   On: 04/20/2022 15:37   ECHOCARDIOGRAM COMPLETE  Result Date: 04/19/2022    ECHOCARDIOGRAM REPORT   Patient Name:   MYRICK GRIFFO Date of Exam: 04/19/2022 Medical Rec #:  [id number]    Height:       63.0 in Accession #:    [id number]   Weight:       137.0 lb Date of Birth:  [birth year]   BSA:          1.646 m Patient Age:    43 years     BP:           129/81 mmHg Patient Gender: M            HR:           71 bpm. Exam Location:  [facility] Procedure: 2D Echo, Cardiac Doppler and Color Doppler Indications:     NSTEMI  History:         Patient has prior history of Echocardiogram examinations, most                  recent 12/28/2019. CHF, Acute MI, Previous Myocardial Infarction                  and CAD, Prior CABG, Arrythmias:Atrial Fibrillation; Risk                  Factors:Hypertension, Current Smoker and Dyslipidemia.                  Pulmonary embolus, AAA.  Sonographer:     Wenda Low Referring Phys:  [id number] JAN A MANSY Diagnosing Phys: Kate Sable MD IMPRESSIONS  1.  Left ventricular ejection fraction, by estimation, is 55 to 60%. The left ventricle has normal function. The left ventricle has no regional wall motion abnormalities. There is moderate concentric left ventricular hypertrophy. Left ventricular diastolic parameters are consistent with Grade I diastolic dysfunction (  impaired relaxation).  2. Right ventricular systolic function is normal. The right ventricular size is normal. There is normal pulmonary artery systolic pressure.  3. The mitral valve is normal in structure. No evidence of mitral valve regurgitation.  4. The aortic valve is tricuspid. Aortic valve regurgitation is not visualized. Aortic valve sclerosis/calcification is present, without any evidence of aortic stenosis.  5. The inferior vena cava is normal in size with greater than 50% respiratory variability, suggesting right atrial pressure of 3 mmHg. FINDINGS  Left Ventricle: Left ventricular ejection fraction, by estimation, is 55 to 60%. The left ventricle has normal function. The left ventricle has no regional wall motion abnormalities. The left ventricular internal cavity size was normal in size. There is  moderate concentric left ventricular hypertrophy. Left ventricular diastolic parameters are consistent with Grade I diastolic dysfunction (impaired relaxation). Right Ventricle: The right ventricular size is normal. No increase in right ventricular wall thickness. Right ventricular systolic function is normal. There is normal pulmonary artery systolic pressure. The tricuspid regurgitant velocity is 2.18 m/s, and  with an assumed right atrial pressure of 3 mmHg, the estimated right ventricular systolic pressure is [id number] mmHg. Left Atrium: Left atrial size was normal in size. Right Atrium: Right atrial size was normal in size. Pericardium: There is no evidence of pericardial effusion. Mitral Valve: The mitral valve is normal in structure. No evidence of mitral valve regurgitation. MV peak gradient, 6.4  mmHg. The mean mitral valve gradient is 2.0 mmHg. Tricuspid Valve: The tricuspid valve is normal in structure. Tricuspid valve regurgitation is trivial. Aortic Valve: The aortic valve is tricuspid. Aortic valve regurgitation is not visualized. Aortic valve sclerosis/calcification is present, without any evidence of aortic stenosis. Aortic valve mean gradient measures 6.0 mmHg. Aortic valve peak gradient measures 11.0 mmHg. Aortic valve area, by VTI measures 2.03 cm. Pulmonic Valve: The pulmonic valve was normal in structure. Pulmonic valve regurgitation is not visualized. Aorta: The aortic root is normal in size and structure. Venous: The inferior vena cava is normal in size with greater than 50% respiratory variability, suggesting right atrial pressure of 3 mmHg. IAS/Shunts: No atrial level shunt detected by color flow Doppler.  LEFT VENTRICLE PLAX 2D LVIDd:         3.80 cm   Diastology LVIDs:         2.90 cm   LV e' medial:    5.44 cm/s LV PW:         1.70 cm   LV E/e' medial:  9.3 LV IVS:        1.40 cm   LV e' lateral:   12.20 cm/s LVOT diam:     1.90 cm   LV E/e' lateral: 4.2 LV SV:         62 LV SV Index:   38 LVOT Area:     2.84 cm  RIGHT VENTRICLE RV Basal diam:  4.10 cm RV Mid diam:    3.60 cm RV S prime:     6.96 cm/s LEFT ATRIUM             Index        RIGHT ATRIUM           Index LA diam:        3.90 cm 2.37 cm/m   RA Area:     14.60 cm LA Vol (A2C):   62.0 ml 37.66 ml/m  RA Volume:   30.80 ml  18.71 ml/m LA Vol (A4C):   31.5 ml  19.13 ml/m LA Biplane Vol: 44.0 ml 26.72 ml/m  AORTIC VALVE                     PULMONIC VALVE AV Area (Vmax):    2.17 cm      PV Vmax:       1.12 m/s AV Area (Vmean):   2.10 cm      PV Peak grad:  5.0 mmHg AV Area (VTI):     2.03 cm AV Vmax:           166.00 cm/s AV Vmean:          119.000 cm/s AV VTI:            0.307 m AV Peak Grad:      11.0 mmHg AV Mean Grad:      6.0 mmHg LVOT Vmax:         127.00 cm/s LVOT Vmean:        88.000 cm/s LVOT VTI:          0.220 m  LVOT/AV VTI ratio: 0.72  AORTA Ao Root diam: 3.60 cm MITRAL VALVE               TRICUSPID VALVE MV Area (PHT): 2.70 cm    TR Peak grad:   19.0 mmHg MV Area VTI:   2.00 cm    TR Vmax:        218.00 cm/s MV Peak grad:  6.4 mmHg MV Mean grad:  2.0 mmHg    SHUNTS MV Vmax:       1.26 m/s    Systemic VTI:  0.22 m MV Vmean:      58.5 cm/s   Systemic Diam: 1.90 cm MV Decel Time: 281 msec MV E velocity: 50.80 cm/s MV A velocity: 68.10 cm/s MV E/A ratio:  0.75 Kate Sable MD Electronically signed by Kate Sable MD Signature Date/Time: 04/19/2022/4:02:54 PM    Final    CT ABDOMEN PELVIS W CONTRAST  Result Date: 04/19/2022 CLINICAL DATA:  Abdominal pain, acute, nonlocalized; Pulmonary embolism (PE) suspected, high prob EXAM: CT ANGIOGRAPHY CHEST CT ABDOMEN AND PELVIS WITH CONTRAST TECHNIQUE: Multidetector CT imaging of the chest was performed using the standard protocol during bolus administration of intravenous contrast. Multiplanar CT image reconstructions and MIPs were obtained to evaluate the vascular anatomy. Multidetector CT imaging of the abdomen and pelvis was performed using the standard protocol during bolus administration of intravenous contrast. RADIATION DOSE REDUCTION: This exam was performed according to the departmental dose-optimization program which includes automated exposure control, adjustment of the mA and/or kV according to patient size and/or use of iterative reconstruction technique. CONTRAST:  114m OMNIPAQUE IOHEXOL 350 MG/ML SOLN COMPARISON:  11/16/2021 FINDINGS: CTA CHEST FINDINGS Cardiovascular: There is adequate opacification of the pulmonary arterial tree. A small intraluminal filling defect is identified within the segmental pulmonary arteries of the left lower lobe in keeping with changes of acute pulmonary embolism. The embolic burden is small. The central pulmonary arteries are of normal caliber. No CT evidence of right heart strain. Coronary artery bypass grafting has been  performed. Cardiac size is globally within normal limits though left ventricular hypertrophy is noted. No pericardial effusion. Mild atherosclerotic calcification noted within the thoracic aorta. No aortic aneurysm. Right internal jugular hemodialysis catheter tip noted within the superior right atrium. Mediastinum/Nodes: No enlarged mediastinal, hilar, or axillary lymph nodes. Thyroid gland, trachea, and esophagus demonstrate no significant findings. Lungs/Pleura: Moderate emphysema. No superimposed focal pulmonary infiltrate. No pneumothorax  or pleural effusion. No central obstructing lesion. Musculoskeletal: Nonunion of the sternal body noted. No acute bone abnormality. No lytic or blastic bone lesion. Review of the MIP images confirms the above findings. CT ABDOMEN and PELVIS FINDINGS Hepatobiliary: Multiple simple cysts and at least 3 benign cavernous hemangioma are seen within the liver. No suspicious enhancing intrahepatic masses identified. No intra or extrahepatic biliary ductal dilation. Gallbladder unremarkable. Pancreas: Unremarkable. No pancreatic ductal dilatation or surrounding inflammatory changes. Spleen: Normal in size without focal abnormality. Adrenals/Urinary Tract: The adrenal glands are unremarkable. The kidneys are markedly atrophic bilaterally but are otherwise unremarkable. The bladder is decompressed Stomach/Bowel: Stomach is within normal limits. Appendix appears normal. No evidence of bowel wall thickening, distention, or inflammatory changes. Vascular/Lymphatic: Extensive aortoiliac atherosclerotic calcification. No aortic aneurysm. No pathologic adenopathy within the abdomen and pelvis. Reproductive: Prostate is unremarkable. Other: No abdominal wall hernia or abnormality. No abdominopelvic ascites. Musculoskeletal: No acute bone abnormality. No lytic or blastic bone lesion. Lumbar levoscoliosis noted. Degenerative changes noted at the lumbosacral junction. Review of the MIP images  confirms the above findings. IMPRESSION: 1. Acute pulmonary embolism. The embolic burden is small. No CT evidence of right heart strain. 2. Left ventricular hypertrophy. 3. Moderate emphysema. 4. No acute intra-abdominal pathology identified. 5. Marked bilateral renal atrophy in keeping with chronic renal failure. 6. Extensive aortoiliac atherosclerotic calcification. Aortic Atherosclerosis (ICD10-I70.0) and Emphysema (ICD10-J43.9). Electronically Signed   By: Fidela Salisbury M.D.   On: 04/19/2022 00:57   CT Angio Chest PE W and/or Wo Contrast  Result Date: 04/19/2022 CLINICAL DATA:  Abdominal pain, acute, nonlocalized; Pulmonary embolism (PE) suspected, high prob EXAM: CT ANGIOGRAPHY CHEST CT ABDOMEN AND PELVIS WITH CONTRAST TECHNIQUE: Multidetector CT imaging of the chest was performed using the standard protocol during bolus administration of intravenous contrast. Multiplanar CT image reconstructions and MIPs were obtained to evaluate the vascular anatomy. Multidetector CT imaging of the abdomen and pelvis was performed using the standard protocol during bolus administration of intravenous contrast. RADIATION DOSE REDUCTION: This exam was performed according to the departmental dose-optimization program which includes automated exposure control, adjustment of the mA and/or kV according to patient size and/or use of iterative reconstruction technique. CONTRAST:  165m OMNIPAQUE IOHEXOL 350 MG/ML SOLN COMPARISON:  11/16/2021 FINDINGS: CTA CHEST FINDINGS Cardiovascular: There is adequate opacification of the pulmonary arterial tree. A small intraluminal filling defect is identified within the segmental pulmonary arteries of the left lower lobe in keeping with changes of acute pulmonary embolism. The embolic burden is small. The central pulmonary arteries are of normal caliber. No CT evidence of right heart strain. Coronary artery bypass grafting has been performed. Cardiac size is globally within normal limits  though left ventricular hypertrophy is noted. No pericardial effusion. Mild atherosclerotic calcification noted within the thoracic aorta. No aortic aneurysm. Right internal jugular hemodialysis catheter tip noted within the superior right atrium. Mediastinum/Nodes: No enlarged mediastinal, hilar, or axillary lymph nodes. Thyroid gland, trachea, and esophagus demonstrate no significant findings. Lungs/Pleura: Moderate emphysema. No superimposed focal pulmonary infiltrate. No pneumothorax or pleural effusion. No central obstructing lesion. Musculoskeletal: Nonunion of the sternal body noted. No acute bone abnormality. No lytic or blastic bone lesion. Review of the MIP images confirms the above findings. CT ABDOMEN and PELVIS FINDINGS Hepatobiliary: Multiple simple cysts and at least 3 benign cavernous hemangioma are seen within the liver. No suspicious enhancing intrahepatic masses identified. No intra or extrahepatic biliary ductal dilation. Gallbladder unremarkable. Pancreas: Unremarkable. No pancreatic ductal dilatation or surrounding inflammatory changes.  Spleen: Normal in size without focal abnormality. Adrenals/Urinary Tract: The adrenal glands are unremarkable. The kidneys are markedly atrophic bilaterally but are otherwise unremarkable. The bladder is decompressed Stomach/Bowel: Stomach is within normal limits. Appendix appears normal. No evidence of bowel wall thickening, distention, or inflammatory changes. Vascular/Lymphatic: Extensive aortoiliac atherosclerotic calcification. No aortic aneurysm. No pathologic adenopathy within the abdomen and pelvis. Reproductive: Prostate is unremarkable. Other: No abdominal wall hernia or abnormality. No abdominopelvic ascites. Musculoskeletal: No acute bone abnormality. No lytic or blastic bone lesion. Lumbar levoscoliosis noted. Degenerative changes noted at the lumbosacral junction. Review of the MIP images confirms the above findings. IMPRESSION: 1. Acute pulmonary  embolism. The embolic burden is small. No CT evidence of right heart strain. 2. Left ventricular hypertrophy. 3. Moderate emphysema. 4. No acute intra-abdominal pathology identified. 5. Marked bilateral renal atrophy in keeping with chronic renal failure. 6. Extensive aortoiliac atherosclerotic calcification. Aortic Atherosclerosis (ICD10-I70.0) and Emphysema (ICD10-J43.9). Electronically Signed   By: Fidela Salisbury M.D.   On: 04/19/2022 00:57   DG Chest Portable 1 View  Result Date: 04/19/2022 CLINICAL DATA:  Three weeks history of shortness of breath. EXAM: PORTABLE CHEST 1 VIEW COMPARISON:  CTA chest 11/16/21. 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 No vascular congestion is seen. Right IJ dialysis catheter again noted with tip about the superior cavoatrial junction. Stable mediastinum with aortic tortuosity and calcific plaque. The lungs are emphysematous but clear. No pleural effusion. S shaped thoracolumbar scoliosis. IMPRESSION: 1. No evidence of acute chest process. Emphysema. 2. Aortic atherosclerosis and tortuosity. Electronically Signed   By: Telford Nab M.D.   On: 04/19/2022 00:07    Labs:  CBC: Recent Labs    04/29/22 0407 04/30/22 0908 05/01/22 0611 05/03/22 2234 05/04/22 0507  WBC 13.3* 9.6 8.3 14.5*  --   HGB 10.9* 10.2* 9.3* 9.3* 10.2*  HCT 35.2* 32.7* 29.8* 31.6*  --   PLT 297 324 323 312  --     COAGS: Recent Labs    11/16/21 1052 04/18/22 0032 04/27/22 1424 04/28/22 1902 04/29/22 1405  INR 1.0 1.0  --   --  1.2  APTT  --  29 32 55*  --     BMP: Recent Labs    04/29/22 0407 04/30/22 0908 05/01/22 0611 05/03/22 2234 05/04/22 0507  NA 136 135 141 137  --   K 5.7* 4.2 4.3 6.1* 5.5*  CL 94* 95* 101 101  --   CO2 '24 24 25 24  '$ --   GLUCOSE 80 92 97 87  --   BUN 68* 44* 58* 69*  --   CALCIUM 7.2* 7.8* 7.7* 8.5*  --   CREATININE 9.00* 7.08* 9.50* 9.97*  --   GFRNONAA 6* 8* 6* 5*  --     LIVER FUNCTION TESTS: Recent Labs     04/18/22 2337 04/20/22 0806 04/26/22 1518 04/30/22 0908 05/03/22 2234  BILITOT 0.6  --  0.5 0.8 0.6  AST 18  --  15 14* 14*  ALT 18  --  15 <5 8  ALKPHOS 59  --  52 46 47  PROT 7.2  --  7.2 6.2* 6.2*  ALBUMIN 3.8 3.3* 3.8 2.6* 2.7*    TUMOR MARKERS: No results for input(s): "AFPTM", "CEA", "CA199", "CHROMGRNA" in the last 8760 hours.  Assessment and Plan:  67 year old male with PMHx of HTN, HLD, CAD status post CABG, chronic systolic CHF, paroxysmal A-fib on Eliquis, PVD, ESRD on HD-TTS schedule and duodenal perforation presents to IR  for intra-abdominal fluid collection drain placement.  Pt resting in bed.  He is A&O, calm and pleasant.  He is in no distress.  Procedure tentatively scheduled for 05/05/22  Risks and benefits of intra-abdominal fluid collection drain placement with moderate sedation discussed with the patient including bleeding, infection, damage to adjacent structures, bowel perforation/fistula connection, and sepsis.  All of the patient's questions were answered, patient is agreeable to proceed. Consent signed and in APP office.   Thank you for this interesting consult.  I greatly enjoyed meeting Anthony Hess and look forward to participating in their care.  A copy of this report was sent to the requesting provider on this date.  Electronically Signed: Tyson Alias, NP 05/04/2022, 8:57 AM   I spent a total of 20 minutes in face to face in clinical consultation, greater than 50% of which was counseling/coordinating care for intra-abdominal fluid collection.

## 2022-05-04 NOTE — Assessment & Plan Note (Signed)
Nephrology consulted for continuation of dialysis

## 2022-05-04 NOTE — Progress Notes (Signed)
Pharmacy Antibiotic Note  Anthony Hess is a 67 y.o. male admitted on 05/03/2022 with  intra-abdominal infection .  Pharmacy has been consulted for Zoysn dosing.  Pt is ESRD, on HD.   Plan: Zosyn 3.375 gm IV X 1 given in ED on 3/12 @ 0012. Zosyn 2.25 gm IV Q8H ordered to start on 3/12 @ 0600.   Height: '5\' 3"'$  (160 cm) Weight: 62.1 kg (137 lb) IBW/kg (Calculated) : 56.9  Temp (24hrs), Avg:98.1 F (36.7 C), Min:98 F (36.7 C), Max:98.1 F (36.7 C)  Recent Labs  Lab 04/28/22 0345 04/29/22 0407 04/30/22 0908 05/01/22 0611 05/03/22 2234 05/04/22 0009  WBC 13.6* 13.3* 9.6 8.3 14.5*  --   CREATININE 6.98* 9.00* 7.08* 9.50* 9.97*  --   LATICACIDVEN  --   --   --   --  0.7 0.5    Estimated Creatinine Clearance: 6 mL/min (A) (by C-G formula based on SCr of 9.97 mg/dL (H)).    No Known Allergies  Antimicrobials this admission:   >>    >>   Dose adjustments this admission:   Microbiology results:  BCx:   UCx:    Sputum:    MRSA PCR:   Thank you for allowing pharmacy to be a part of this patient's care.  Marilena Trevathan D 05/04/2022 12:37 AM

## 2022-05-04 NOTE — H&P (Signed)
History and Physical    Patient: Anthony Hess [id number] DOB: 1956/02/22 DOA: 05/03/2022 DOS: the patient was seen and examined on 05/04/2022 PCP: Patient, No Pcp Per  Patient coming from: Home  Chief Complaint:  Chief Complaint  Patient presents with   Abdominal Pain    HPI: [name] is a 67 y.o. male with medical history significant for HTN, HLD, CAD s/p CABG,  chronic systolic CHF, paroxysmal A-fib and PE (04/19/22) on Eliquis, PVD, ESRD on HD TTS schedule, infrarenal abdominal aortic aneurysm without rupture, hospitalized from  3/4 to 05/01/22 with duodenal perforation, repaired on 04/26/2022, who returns to the ED with a complaint of right upper quadrant pain, nausea as well as 2 episodes of black stool.  He denies vomiting, fever or chills.  Denies chest pain, palpitations or lightheadedness. ED course and data reviewed: BP 176/111 with otherwise normal vitals.  Labs notable for WBC of 14.5.  Lactic acid 0.7.  Hemoglobin at baseline at 9.7.  Lipase 57, with normal LFTs.  Potassium 6.1. EKG, personally viewed and interpreted showing NSR at 79 with incomplete right bundle branch block and no ischemic ST-T wave changes. CT abdomen and pelvis shows a small abscess right hepatic lobe as described below : IMPRESSION: 1. Slightly more organized 3.5 x 1.9 cm organized fluid collection along the posterior inferior right hepatic lobe likely representing an abscess. Persistent grossly stable associated right upper quadrant and pericolic gutter trace free fluid and inflammatory changes. Finding does appear to be contiguous with the gallbladder fossa with however no increased free fluid or gas to suggest a biliary leak. 2. Interval decrease of small volume scattered free foci of gas within the upper abdomen likely related to postsurgical changes. 3. Persistent left lower lobe subsegmental pulmonary embolus. No associated heart strain or visualized pulmonary infarction.  Patient started on  Zosyn and was given calcium gluconate for hyperkalemia. The ED provider spoke with surgeon, Dr. Peyton Najjar who advised that given the small size of the abscess it should resolve with antibiotics alone and recommended admission to the hospitalist service.  Hospitalist thus consulted.     Review of Systems: As mentioned in the history of present illness. All other systems reviewed and are negative.  Past Medical History:  Diagnosis Date   Aortic atherosclerosis ([place])    Bilateral carotid artery disease ([place])    Bladder cancer ([place])    Coronary artery disease 12/20/2018   a.) LHC 12/20/2018: 50% OM1, 40% OM2, 95% o-pLAD, 75% o=pLCx, 40% mLM, 70% D1, 60% mRCA-1, 50% mRCA-2; refer to CVTS. b.) 4v CABG at [facility] on 12/27/2018: LIMA-LAD, RIMA-PDA, seg LRA-OM1-D1   DCM (dilated cardiomyopathy) ([place]) 12/05/2018   a.) TTE 12/05/2018: EF 40-45%. b.) TTE 12/28/2019: EF 20-25%.   ESRD (end stage renal disease) ([place])    a.) T-Th-Sat   HFrEF (heart failure with reduced ejection fraction) ([place]) 12/05/2018   a.) TTE 12/05/2018: EF 40-45%; mild LVH; ant/apical/sep HK; mild TR . b.) TTE 12/28/2019: EF 20-25%; mod LVH; mod MR/AR; G1DD.   History of 2019 novel coronavirus disease (COVID-19) 04/08/2021   History of kidney stones    HLD (hyperlipidemia)    Hypertension    Infrarenal abdominal aortic aneurysm (AAA) without rupture ([place]) 03/05/2021   a.) CT abd/pelvis; measured 3.2 cm.   Myocardial infarction [facility])    PVD (peripheral vascular disease) ([place])    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   a.) LIMA-LAD, RIMA-PDA, sequential LEFT radial artery to OM1 and D1   Sepsis ([place]) 03/14/2021  Wears glasses    Past Surgical History:  Procedure Laterality Date   AV FISTULA PLACEMENT Left 07/30/2021   Procedure: INSERTION OF ARTERIOVENOUS (AV) GORE-TEX GRAFT ARM BRACHIAL ARTERY TO AXILLARY VEIN;  Surgeon: Algernon Huxley, MD;  Location: [facility] ORS;  Service: Vascular;  Laterality: Left;   CAPD INSERTION N/A 12/31/2019   Procedure:  LAPAROSCOPIC INSERTION CONTINUOUS AMBULATORY PERITONEAL DIALYSIS  (CAPD) CATHETER;  Surgeon: Jules Husbands, MD;  Location: [facility];  Service: General;  Laterality: N/A;   CAPD REMOVAL N/A 04/10/2020   Procedure: LAPAROSCOPIC REVISION OF CONTINUOUS AMBULATORY PERITONEAL DIALYSIS  (CAPD) CATHETER;  Surgeon: Jules Husbands, MD;  Location: [facility];  Service: General;  Laterality: N/A;   CORONARY ARTERY BYPASS GRAFT N/A 12/27/2018   Procedure: CORONARY ARTERY BYPASS GRAFTING (CABG) X 4 ON PUMP USING RIGHT & LEFT INTERNAL MAMMARY ARTERY LEFT RADIAL ARTERY ENDOSCOPICALLY HARVESTED;  Surgeon: [name], MD;  Location: [place];  Service: Open Heart Surgery;  Laterality: N/A;   CYSTOSCOPY W/ RETROGRADES Bilateral 05/15/2019   Procedure: CYSTOSCOPY WITH RETROGRADE PYELOGRAM;  Surgeon: Abbie Sons, MD;  Location: [facility];  Service: Urology;  Laterality: Bilateral;   CYSTOSCOPY WITH BIOPSY N/A 05/15/2019   Procedure: CYSTOSCOPY WITH bladder BIOPSY;  Surgeon: Abbie Sons, MD;  Location: [facility];  Service: Urology;  Laterality: N/A;   DIALYSIS/PERMA CATHETER INSERTION N/A 12/28/2019   Procedure: DIALYSIS/PERMA CATHETER INSERTION;  Surgeon: Algernon Huxley, MD;  Location: [place] CV [facility];  Service: Cardiovascular;  Laterality: N/A;   DIALYSIS/PERMA CATHETER INSERTION N/A 03/18/2021   Procedure: DIALYSIS/PERMA CATHETER INSERTION;  Surgeon: Algernon Huxley, MD;  Location: [place] CV LAB;  Service: Cardiovascular;  Laterality: N/A;   DIALYSIS/PERMA CATHETER REMOVAL N/A 06/02/2020   Procedure: DIALYSIS/PERMA CATHETER REMOVAL;  Surgeon: Algernon Huxley, MD;  Location: [place] CV LAB;  Service: Cardiovascular;  Laterality: N/A;   EXCHANGE OF A DIALYSIS CATHETER Right 04/10/2020   Procedure: EXCHANGE OF A DIALYSIS CATHETER;  Surgeon: Jules Husbands, MD;  Location: [facility];  Service: General;  Laterality: Right;   [place]  01/20/2021   Procedure: HERNIA REPAIR INCISIONAL;  Surgeon:  Olean Ree, MD;  Location: [facility];  Service: General;;   IR IMAGE GUIDED DRAINAGE PERCUT CATH  PERITONEAL RETROPERIT  04/07/2020   LAPAROSCOPY N/A 04/16/2021   Procedure: LAPAROSCOPY DIAGNOSTIC;  Surgeon: Benjamine Sprague, DO;  Location: [facility];  Service: General;  Laterality: N/A;   LAPAROTOMY N/A 04/26/2022   Procedure: EXPLORATORY LAPAROTOMY WITH REPAIR OF DUODENAL ULCER;  Surgeon: Herbert Pun, MD;  Location: [facility];  Service: General;  Laterality: N/A;   LEFT HEART CATH AND CORONARY ANGIOGRAPHY Left 12/20/2018   Procedure: LEFT HEART CATH AND CORONARY ANGIOGRAPHY;  Surgeon: Isaias Cowman, MD;  Location: [place] CV [facility];  Service: Cardiovascular;  Laterality: Left;   RADIAL ARTERY HARVEST Left 12/27/2018   Procedure: ENDOSCOPIC RADIAL ARTERY HARVEST;  Surgeon: [name], MD;  Location: [place];  Service: Open Heart Surgery;  Laterality: Left;   REMOVAL OF A DIALYSIS CATHETER Left 03/20/2021   Procedure: REMOVAL OF A PD CATHETER;  Surgeon: Algernon Huxley, MD;  Location: [facility];  Service: Vascular;  Laterality: Left;   REVISION OF ARTERIOVENOUS GORETEX GRAFT Left 09/11/2021   Procedure: Excisionof infected AV graft;  Surgeon: Katha Cabal, MD;  Location: [facility];  Service: Vascular;  Laterality: Left;   TEE WITHOUT CARDIOVERSION N/A 12/27/2018   Procedure: TRANSESOPHAGEAL ECHOCARDIOGRAM (TEE);  Surgeon: Fredrich Romans  Z, MD;  Location: [place];  Service: Open Heart Surgery;  Laterality: N/A;   TUMOR REMOVAL  2019   Bladder   Social History:  reports that he quit smoking about 3 years ago. His smoking use included cigarettes. He smoked an average of .25 packs per day. He has never used smokeless tobacco. He reports that he does not drink alcohol and does not use drugs.  No Known Allergies  Family History  Family history unknown: Yes    Prior to Admission medications   Medication Sig Start Date End Date Taking? Authorizing Provider  acetaminophen (TYLENOL)  500 MG tablet Take 2 tablets (1,000 mg total) by mouth every 6 (six) hours as needed for mild pain, fever or headache (or Fever >/= 101). 11/18/21   Emeterio Reeve, DO  amoxicillin-clavulanate (AUGMENTIN) 500-125 MG tablet Take 1 tablet by mouth daily for 3 days. 05/01/22 05/04/22  Lucienne Minks, MD  apixaban (ELIQUIS) 5 MG TABS tablet Take 1 tablet (5 mg total) by mouth 2 (two) times daily. 05/01/22   Lucienne Minks, MD  atorvastatin (LIPITOR) 80 MG tablet Take 1 tablet (80 mg total) by mouth daily. 04/21/22   Jennye Boroughs, MD  calcitRIOL (ROCALTROL) 0.5 MCG capsule Take 1 capsule (0.5 mcg total) by mouth daily. 11/18/21   Emeterio Reeve, DO  calcium acetate (PHOSLO) 667 MG capsule Take 1 capsule (667 mg total) by mouth 3 (three) times daily with meals. 11/18/21   Emeterio Reeve, DO  hydrALAZINE (APRESOLINE) 25 MG tablet Take 1 tablet (25 mg total) by mouth 3 (three) times daily. 04/21/22   Jennye Boroughs, MD  isosorbide dinitrate (ISORDIL) 10 MG tablet Take 1 tablet (10 mg total) by mouth 3 (three) times daily. 04/21/22   Jennye Boroughs, MD  metoprolol succinate (TOPROL-XL) 25 MG 24 hr tablet Take 25 mg by mouth daily. 03/12/22   [provider]  multivitamin (RENA-VIT) TABS tablet Take 1 tablet by mouth at bedtime. 03/23/21   Ezekiel Slocumb, DO  pantoprazole (PROTONIX) 40 MG tablet Take 1 tablet (40 mg total) by mouth 2 (two) times daily. 05/01/22 05/31/22  Lucienne Minks, MD  sucroferric oxyhydroxide (VELPHORO) 500 MG chewable tablet Chew 1 tablet (500 mg total) by mouth 3 (three) times daily with meals. 11/18/21   Emeterio Reeve, DO  Vitamin D, Ergocalciferol, (DRISDOL) 1.25 MG (50000 UNIT) CAPS capsule Take 1 capsule (50,000 Units total) by mouth every 7 (seven) days. 11/18/21   Emeterio Reeve, DO    Physical Exam: Vitals:   05/03/22 2044 05/03/22 2045 05/03/22 2349  BP: (!) 176/111  (!) 213/125  Pulse: 79  79  Resp:   20  Temp: 98 F (36.7 C)    TempSrc: Oral    SpO2: 100%   98%  Weight:  62.1 kg   Height:  '5\' 3"'$  (1.6 m)    Physical Exam Vitals and nursing note reviewed.  Constitutional:      General: He is not in acute distress. HENT:     Head: Normocephalic and atraumatic.  Cardiovascular:     Rate and Rhythm: Normal rate and regular rhythm.     Heart sounds: Normal heart sounds.  Pulmonary:     Effort: Pulmonary effort is normal.     Breath sounds: Normal breath sounds.  Abdominal:     Palpations: Abdomen is soft.     Tenderness: There is abdominal tenderness in the right upper quadrant, right lower quadrant and periumbilical area.     Comments: Surgical wound with well-approximated  edges  Neurological:     Mental Status: Mental status is at baseline.     Labs on Admission: I have personally reviewed following labs and imaging studies  CBC: Recent Labs  Lab 04/28/22 0345 04/29/22 0407 04/30/22 0908 05/01/22 0611 05/03/22 2234  WBC 13.6* 13.3* 9.6 8.3 14.5*  NEUTROABS  --   --   --   --  11.5*  HGB 10.5* 10.9* 10.2* 9.3* 9.3*  HCT 33.7* 35.2* 32.7* 29.8* 31.6*  MCV 92.3 93.4 91.6 92.5 98.1  PLT 251 297 324 323 [id number]   Basic Metabolic Panel: Recent Labs  Lab 04/27/22 0535 04/28/22 0345 04/29/22 0407 04/30/22 0908 05/01/22 0611 05/03/22 2234  NA 133* 136 136 135 141 137  K 5.7* 5.0 5.7* 4.2 4.3 6.1*  CL 94* 95* 94* 95* 101 101  CO2 '24 27 24 24 25 24  '$ GLUCOSE 181* 90 80 92 97 87  BUN 84* 46* 68* 44* 58* 69*  CREATININE 10.42* 6.98* 9.00* 7.08* 9.50* 9.97*  CALCIUM 7.4* 7.2* 7.2* 7.8* 7.7* 8.5*  MG 2.0 1.9 2.3 2.1 2.4  --   PHOS 10.1* 8.8* 9.7* 7.3* 8.9*  --    GFR: Estimated Creatinine Clearance: 6 mL/min (A) (by C-G formula based on SCr of 9.97 mg/dL (H)). Liver Function Tests: Recent Labs  Lab 04/30/22 0908 05/03/22 2234  AST 14* 14*  ALT <5 8  ALKPHOS 46 47  BILITOT 0.8 0.6  PROT 6.2* 6.2*  ALBUMIN 2.6* 2.7*   Recent Labs  Lab 05/03/22 2234  LIPASE 57*   No results for input(s): "AMMONIA" in the last 168  hours. Coagulation Profile: Recent Labs  Lab 04/29/22 1405  INR 1.2   Cardiac Enzymes: No results for input(s): "CKTOTAL", "CKMB", "CKMBINDEX", "TROPONINI" in the last 168 hours. BNP (last 3 results) No results for input(s): "PROBNP" in the last 8760 hours. HbA1C: No results for input(s): "HGBA1C" in the last 72 hours. CBG: No results for input(s): "GLUCAP" in the last 168 hours. Lipid Profile: No results for input(s): "CHOL", "HDL", "LDLCALC", "TRIG", "CHOLHDL", "LDLDIRECT" in the last 72 hours. Thyroid Function Tests: No results for input(s): "TSH", "T4TOTAL", "FREET4", "T3FREE", "THYROIDAB" in the last 72 hours. Anemia Panel: No results for input(s): "VITAMINB12", "FOLATE", "FERRITIN", "TIBC", "IRON", "RETICCTPCT" in the last 72 hours. Urine analysis:    Component Value Date/Time   COLORURINE STRAW (A) 11/16/2021 1135   APPEARANCEUR CLEAR (A) 11/16/2021 1135   APPEARANCEUR Cloudy (A) 05/08/2019 1448   LABSPEC 1.009 11/16/2021 1135   PHURINE 7.0 11/16/2021 1135   GLUCOSEU 150 (A) 11/16/2021 1135   HGBUR NEGATIVE 11/16/2021 1135   BILIRUBINUR NEGATIVE 11/16/2021 1135   BILIRUBINUR Negative 05/08/2019 1448   KETONESUR NEGATIVE 11/16/2021 1135   PROTEINUR 100 (A) 11/16/2021 1135   NITRITE NEGATIVE 11/16/2021 1135   LEUKOCYTESUR NEGATIVE 11/16/2021 1135    Radiological Exams on Admission: CT Abdomen Pelvis W Contrast  Result Date: 05/03/2022 CLINICAL DATA:  Abdominal pain, post-op reporting pain to abdominal incision site that has worsened since getting discharged from hospital 05/01/22. States he had surgery on March 4th for perforated bowel. EXAM: CT ABDOMEN AND PELVIS WITH CONTRAST TECHNIQUE: Multidetector CT imaging of the abdomen and pelvis was performed using the standard protocol following bolus administration of intravenous contrast. RADIATION DOSE REDUCTION: This exam was performed according to the departmental dose-optimization program which includes automated  exposure control, adjustment of the mA and/or kV according to patient size and/or use of iterative reconstruction technique. CONTRAST:  7m OMNIPAQUE IOHEXOL  350 MG/ML SOLN COMPARISON:  CT abdomen pelvis 04/29/2022, CT abdomen pelvis 04/26/2022, CT angio chest and abdomen pelvis 04/19/2022 FINDINGS: Lower chest: Left lower lobe subsegmental pulmonary artery filling defect consistent with pulmonary embolus. 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 Slightly more organized 3.5 x 1.9 cm organized fluid collection along the posterior inferior right hepatic lobe likely representing an abscess (6:47). Associated trace perihepatic simple free fluid. Redemonstration of adjacent fat density of unclear etiology (6:40)- possibly postsurgical in the setting of duodenal ulcer perforation repair. Associated mild pericholecystic free fluid-not increased from prior. No gallbladder wall thickening. No CT evidence of cholelithiasis. No biliary dilatation. Pancreas: No focal lesion. Normal pancreatic contour. No surrounding inflammatory changes. No main pancreatic ductal dilatation. Spleen: Normal in size without focal abnormality. Adrenals/Urinary Tract: No adrenal nodule bilaterally. Bilateral atrophic kidneys. Bilateral kidneys enhance symmetrically. Subcentimeter hypodensities are too small to characterize. No hydronephrosis. No hydroureter. The urinary bladder is unremarkable. Stomach/Bowel: PO contrast reaches the rectum. Stomach is within normal limits. No evidence of bowel wall thickening or dilatation. Appendix appears normal. Vascular/Lymphatic: No abdominal aorta or iliac aneurysm. Severe atherosclerotic plaque of the aorta and its branches. No abdominal, pelvic, or inguinal lymphadenopathy. Reproductive: Prostate is unremarkable. Other: Stranding of the right pericolic gutter with associated trace fluid. Mild perihepatic simple free fluid. Mild pericholecystic simple free  fluid. Interval decrease of small volume scattered free foci of gas within the upper abdomen likely related to postsurgical changes. No organized fluid collection. Musculoskeletal: No abdominal wall hernia or abnormality. No suspicious lytic or blastic osseous lesions. No acute displaced fracture. Levoscoliosis of the thoracolumbar spine. IMPRESSION: 1. Slightly more organized 3.5 x 1.9 cm organized fluid collection along the posterior inferior right hepatic lobe likely representing an abscess. Persistent grossly stable associated right upper quadrant and pericolic gutter trace free fluid and inflammatory changes. Finding does appear to be contiguous with the gallbladder fossa with however no increased free fluid or gas to suggest a biliary leak. 2. Interval decrease of small volume scattered free foci of gas within the upper abdomen likely related to postsurgical changes. 3. Persistent left lower lobe subsegmental pulmonary embolus. No associated heart strain or visualized pulmonary infarction. Electronically Signed   By: Iven Finn M.D.   On: 05/03/2022 22:51     Data Reviewed: Relevant notes from primary care and specialist visits, past discharge summaries as available in EHR, including Care Everywhere. Prior diagnostic testing as pertinent to current admission diagnoses Updated medications and problem lists for reconciliation ED course, including vitals, labs, imaging, treatment and response to treatment Triage notes, nursing and pharmacy notes and ED provider's notes Notable results as noted in HPI   Assessment and Plan: * Postprocedural intraabdominal abscess S/p laparotomy for for perforated duodenal perforation on 3/4 Patient presents with abdominal pain and had 2 melanotic stools CT abdomen showing right hepatic lobe abscess Dr. Peyton Najjar was consulted from the ED and advised given size should resolve with antibiotics Continue Zosyn Pain control Surgery consulted to follow Can  consider IR if not improving with antibiotics  Melena Patient had 2 episodes of black stool,  Will give IV Protonix Serial H&H GI consult Will keep n.p.o. in case of procedure Will hold Eliquis   Hypertensive urgency Systolic in the [id number] in the ED Hydralazine IV as needed Expecting improvement with dialysis in the a.m.  End-stage renal disease on hemodialysis [facility]) Nephrology consulted for continuation of dialysis  Anemia in ESRD (end-stage renal disease) ([place])  Hemoglobin stable  Chronic combined systolic and diastolic CHF (congestive heart failure) (HCC) Clinically euvolemic Lasix IV as needed as meds being held overnight as patient is n.p.o. To resume home metoprolol, isosorbide, hydralazine, once cleared by GI to eat if no procedure  Atrial fibrillation ([facility]) Holding Eliquis due to melena stool Continue metoprolol once cleared by GI if no procedure intended.  CAD S/P CABG x 4 No complaints of chest pain and EKG nonacute Will hold meds tonight while n.p.o.  Emphysema lung ([place]) DuoNebs as needed     DVT prophylaxis: SCD  Consults: GI, Dr. Tora Perches, Dr. [name] nephrology and Dr. Peyton Najjar surgery  Advance Care Planning:   Code Status: Prior   Family Communication: none  Disposition Plan: Back to previous home environment  Severity of Illness: The appropriate patient status for this patient is INPATIENT. Inpatient status is judged to be reasonable and necessary in order to provide the required intensity of service to ensure the patient's safety. The patient's presenting symptoms, physical exam findings, and initial radiographic and laboratory data in the context of their chronic comorbidities is felt to place them at high risk for further clinical deterioration. Furthermore, it is not anticipated that the patient will be medically stable for discharge from the hospital within 2 midnights of admission.   * I certify that at the point of admission it is my clinical  judgment that the patient will require inpatient hospital care spanning beyond 2 midnights from the point of admission due to high intensity of service, high risk for further deterioration and high frequency of surveillance required.*  Author: Athena Masse, MD 05/04/2022 12:19 AM  For on call review [email or web address].

## 2022-05-04 NOTE — Assessment & Plan Note (Addendum)
Paroxysmal in nature.  Can go back on Eliquis since the patient has not had any bowel movements here.  Continue metoprolol

## 2022-05-04 NOTE — ED Notes (Signed)
Assumed care from Natalie, RN. Pt resting comfortably in bed at this time. Pt denies any current needs or questions. Call light with in reach.   

## 2022-05-04 NOTE — Progress Notes (Signed)
[facility]  ROUNDING NOTE   Subjective:   Anthony Hess is a 67 year old male with past medical conditions including hypertension, PVD, dyslipidemia, CAD, four-vessel CABG, and end-stage renal disease on hemodialysis.  Patient presents to the emergency department with complaints of abdominal pain and has been admitted for Melena [K92.1] Hyperkalemia [E87.5] Right upper quadrant abdominal pain [R10.11] Postprocedural intraabdominal abscess [T81.43XA]  Patient is known to our practice from previous admissions and currently receives outpatient dialysis treatments at [facility] on a TTS schedule, supervised by Dr. Holley Raring.  Patient states he began having increased abdominal pain since discharge on 05/01/2022.  Patient denies heavy lifting.  Denies nausea, vomiting, or diarrhea.  Denies chest pain.  No shortness of breath.  Pain not well-controlled with prescribed medications.  Appetite remains poor.  Patient seen and evaluated during dialysis.   HEMODIALYSIS FLOWSHEET:  Blood Flow Rate (mL/min): 400 mL/min Arterial Pressure (mmHg): -220 mmHg Venous Pressure (mmHg): 170 mmHg TMP (mmHg): -3 mmHg Ultrafiltration Rate (mL/min): 686 mL/min Dialysate Flow Rate (mL/min): 300 ml/min  Labs on ED arrival significant for potassium 6.1, BUN 69, creatinine 9.97 with GFR 5, albumin 2.7, and hemoglobin 9.3 with elevated WBC 14.5.  Potassium treated in ED, 5.5 today.  CT abdomen pelvis shows an anterior right hepatic lobe abscess.  Objective:  Vital signs in last 24 hours:  Temp:  [97.7 F (36.5 C)-98.6 F (37 C)] 97.7 F (36.5 C) (03/12 0954) Pulse Rate:  [70-86] 86 (03/12 1100) Resp:  [10-22] 20 (03/12 1100) BP: (139-226)/(90-143) 139/108 (03/12 1100) SpO2:  [92 %-100 %] 96 % (03/12 1100) Weight:  [57.1 kg-62.1 kg] 57.1 kg (03/12 1000)  Weight change:  Filed Weights   05/03/22 2045 05/04/22 1000  Weight: 62.1 kg 57.1 kg    Intake/Output: No intake/output data recorded.    Intake/Output this shift:  No intake/output data recorded.  Physical Exam: General: NAD  Head: Normocephalic, atraumatic. Moist oral mucosal membranes  Eyes: Anicteric  Lungs:  Clear to auscultation, normal effort  Heart: Regular rate and rhythm  Abdomen:  Soft, tender  Extremities: No peripheral edema.  Neurologic: Alert and oriented,  moving all four extremities  Skin: No lesions  Access: Right PermCath    Basic Metabolic Panel: Recent Labs  Lab 04/28/22 0345 04/29/22 0407 04/30/22 0908 05/01/22 0611 05/03/22 2234 05/04/22 0507  NA 136 136 135 141 137  --   K 5.0 5.7* 4.2 4.3 6.1* 5.5*  CL 95* 94* 95* 101 101  --   CO2 '27 24 24 25 24  '$ --   GLUCOSE 90 80 92 97 87  --   BUN 46* 68* 44* 58* 69*  --   CREATININE 6.98* 9.00* 7.08* 9.50* 9.97*  --   CALCIUM 7.2* 7.2* 7.8* 7.7* 8.5*  --   MG 1.9 2.3 2.1 2.4  --   --   PHOS 8.8* 9.7* 7.3* 8.9*  --   --      Liver Function Tests: Recent Labs  Lab 04/30/22 0908 05/03/22 2234  AST 14* 14*  ALT <5 8  ALKPHOS 46 47  BILITOT 0.8 0.6  PROT 6.2* 6.2*  ALBUMIN 2.6* 2.7*    Recent Labs  Lab 05/03/22 2234  LIPASE 57*    No results for input(s): "AMMONIA" in the last 168 hours.  CBC: Recent Labs  Lab 04/28/22 0345 04/29/22 0407 04/30/22 0908 05/01/22 0611 05/03/22 2234 05/04/22 0507  WBC 13.6* 13.3* 9.6 8.3 14.5*  --   NEUTROABS  --   --   --   --  11.5*  --   HGB 10.5* 10.9* 10.2* 9.3* 9.3* 10.2*  HCT 33.7* 35.2* 32.7* 29.8* 31.6*  --   MCV 92.3 93.4 91.6 92.5 98.1  --   PLT 251 297 324 323 312  --      Cardiac Enzymes: No results for input(s): "CKTOTAL", "CKMB", "CKMBINDEX", "TROPONINI" in the last 168 hours.  BNP: Invalid input(s): "POCBNP"  CBG: No results for input(s): "GLUCAP" in the last 168 hours.  Microbiology: Results for orders placed or performed during the hospital encounter of 05/03/22  Culture, blood (routine x 2)     Status: None (Preliminary result)   Collection Time: 05/04/22  12:09 AM   Specimen: BLOOD  Result Value Ref Range Status   Specimen Description BLOOD  RAC  Final   Special Requests   Final    BOTTLES DRAWN AEROBIC AND ANAEROBIC Blood Culture results may not be optimal due to an inadequate volume of blood received in culture bottles   Culture   Final    NO GROWTH < 12 HOURS Performed at [facility], [address], [place], [place] [address]    Report Status PENDING  Incomplete  Culture, blood (routine x 2)     Status: None (Preliminary result)   Collection Time: 05/04/22 12:10 AM   Specimen: BLOOD  Result Value Ref Range Status   Specimen Description BLOOD  RIGHT FOREARM  Final   Special Requests   Final    BOTTLES DRAWN AEROBIC AND ANAEROBIC Blood Culture adequate volume   Culture   Final    NO GROWTH < 12 HOURS Performed at [facility], [address] [place], [place] [address]    Report Status PENDING  Incomplete    Coagulation Studies: No results for input(s): "LABPROT", "INR" in the last 72 hours.   Urinalysis: No results for input(s): "COLORURINE", "LABSPEC", "PHURINE", "GLUCOSEU", "HGBUR", "BILIRUBINUR", "KETONESUR", "PROTEINUR", "UROBILINOGEN", "NITRITE", "LEUKOCYTESUR" in the last 72 hours.  Invalid input(s): "APPERANCEUR"    Imaging: CT Abdomen Pelvis W Contrast  Result Date: 05/03/2022 CLINICAL DATA:  Abdominal pain, post-op reporting pain to abdominal incision site that has worsened since getting discharged from hospital 05/01/22. States he had surgery on March 4th for perforated bowel. EXAM: CT ABDOMEN AND PELVIS WITH CONTRAST TECHNIQUE: Multidetector CT imaging of the abdomen and pelvis was performed using the standard protocol following bolus administration of intravenous contrast. RADIATION DOSE REDUCTION: This exam was performed according to the departmental dose-optimization program which includes automated exposure control, adjustment of the mA and/or kV according to patient size and/or use of  iterative reconstruction technique. CONTRAST:  65m OMNIPAQUE IOHEXOL 350 MG/ML SOLN COMPARISON:  CT abdomen pelvis 04/29/2022, CT abdomen pelvis 04/26/2022, CT angio chest and abdomen pelvis 04/19/2022 FINDINGS: Lower chest: Left lower lobe subsegmental pulmonary artery filling defect consistent with pulmonary embolus. 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 Slightly more organized 3.5 x 1.9 cm organized fluid collection along the posterior inferior right hepatic lobe likely representing an abscess (6:47). Associated trace perihepatic simple free fluid. Redemonstration of adjacent fat density of unclear etiology (6:40)- possibly postsurgical in the setting of duodenal ulcer perforation repair. Associated mild pericholecystic free fluid-not increased from prior. No gallbladder wall thickening. No CT evidence of cholelithiasis. No biliary dilatation. Pancreas: No focal lesion. Normal pancreatic contour. No surrounding inflammatory changes. No main pancreatic ductal dilatation. Spleen: Normal in size without focal abnormality. Adrenals/Urinary Tract: No adrenal nodule bilaterally. Bilateral atrophic kidneys. Bilateral kidneys enhance symmetrically.  Subcentimeter hypodensities are too small to characterize. No hydronephrosis. No hydroureter. The urinary bladder is unremarkable. Stomach/Bowel: PO contrast reaches the rectum. Stomach is within normal limits. No evidence of bowel wall thickening or dilatation. Appendix appears normal. Vascular/Lymphatic: No abdominal aorta or iliac aneurysm. Severe atherosclerotic plaque of the aorta and its branches. No abdominal, pelvic, or inguinal lymphadenopathy. Reproductive: Prostate is unremarkable. Other: Stranding of the right pericolic gutter with associated trace fluid. Mild perihepatic simple free fluid. Mild pericholecystic simple free fluid. Interval decrease of small volume scattered free foci of gas within the upper  abdomen likely related to postsurgical changes. No organized fluid collection. Musculoskeletal: No abdominal wall hernia or abnormality. No suspicious lytic or blastic osseous lesions. No acute displaced fracture. Levoscoliosis of the thoracolumbar spine. IMPRESSION: 1. Slightly more organized 3.5 x 1.9 cm organized fluid collection along the posterior inferior right hepatic lobe likely representing an abscess. Persistent grossly stable associated right upper quadrant and pericolic gutter trace free fluid and inflammatory changes. Finding does appear to be contiguous with the gallbladder fossa with however no increased free fluid or gas to suggest a biliary leak. 2. Interval decrease of small volume scattered free foci of gas within the upper abdomen likely related to postsurgical changes. 3. Persistent left lower lobe subsegmental pulmonary embolus. No associated heart strain or visualized pulmonary infarction. Electronically Signed   By: Iven Finn M.D.   On: 05/03/2022 22:51     Medications:    piperacillin-tazobactam (ZOSYN)  IV Stopped (05/04/22 0612)     Chlorhexidine Gluconate Cloth  6 each Topical Q0600   pantoprazole (PROTONIX) IV  40 mg Intravenous Q24H   acetaminophen **OR** acetaminophen, heparin, hydrALAZINE, morphine injection, ondansetron **OR** ondansetron (ZOFRAN) IV  Assessment/ Plan:  Mr. Maddax Rabine is a 67 y.o.  male  Jailon Casique is a 67 year old male with past medical conditions including hypertension, PVD, dyslipidemia, CAD, four-vessel CABG, and end-stage renal disease on hemodialysis.  Patient presents to the emergency department with complaints of shortness of breath and has been admitted for Melena [K92.1] Hyperkalemia [E87.5] Right upper quadrant abdominal pain [R10.11] Postprocedural intraabdominal abscess [T81.43XA]  [facility]/TTS/right PermCath.  End-stage renal disease with hyperkalemia on hemodialysis.    Potassium 6.1 on ED arrival.  Treated  in ED.  Potassium this a.m. 5.5.  Patient receiving scheduled dialysis treatment today.  Next treatment scheduled for Thursday.  2. Anemia of chronic kidney disease Lab Results  Component Value Date   HGB 10.2 (L) 05/04/2022    Patient receives Mircera at outpatient clinic.  Hemoglobin stable.  Patient reports dark stools.  Primary team has consulted GI and ordered PPI.  3. Secondary Hyperparathyroidism: with outpatient labs: PTH 532, phosphorus 10.7, calcium 6.1 on 02/11/22.  :  Lab Results  Component Value Date   PTH 159 (H) 12/27/2019   CALCIUM 8.5 (L) 05/03/2022   CAION 0.84 (LL) 09/11/2021   PHOS 8.9 (H) 05/01/2022    Hyperphosphatemia noted.  Patient prescribed calcium acetate and calcitriol outpatient.  Will resume once taking p.o.'s.  4.  Hypertension with chronic kidney disease.  Home regimen includes amlodipine, clonidine, metoprolol, Entresto and losartan.  Currently receiving hydralazine, isosorbide, metoprolol, and Entresto.  Blood pressure blood pressure 129/86 during dialysis.  5.  Abscess, hepatic lobe seen on CT abdomen pelvis.  Surgery feels medical management is sufficient.  Antibiotics started by primary team.   LOS: 0 Darthy Manganelli 3/12/202411:32 AM

## 2022-05-04 NOTE — Consult Note (Signed)
Consultation  Referring Provider:     Dr Damita Dunnings Admit date 05/03/22 Consult date   05/04/22      Reason for Consultation:     melena         HPI:   Anthony Hess is a 67 y.o. male  with medical history significant for HTN, HLD, CAD s/p CABG,  chronic systolic CHF, paroxysmal A-fib and PE (04/19/22) on Eliquis, PVD, ESRD on HD TTS schedule, infrarenal abdominal aortic aneurysm without rupture, hospitalized from  3/4 to 05/01/22 with duodenal perforation, repaired on 3/42024 (Dr Peyton Najjar), who returns to the ED with a complaint of right upper quadrant pain, nausea as well as 2 episodes of black stool - one episode on Saturday, had a brown stool Sunday and a  black stool (1) yesterday. His hgb was stable at 9.3 on admission and was 10.2 this am. IV PPI has been started and eliquis held. GI consulted for management of melena.  Surgery has also been consulted for hepatic abscess found on CT- has been started on zosyn, drain placement planned for tomorrow as clinically feasible: : IMPRESSION: 1. Slightly more organized 3.5 x 1.9 cm organized fluid collection along the posterior inferior right hepatic lobe likely representing an abscess. Persistent grossly stable associated right upper quadrant and pericolic gutter trace free fluid and inflammatory changes. Finding does appear to be contiguous with the gallbladder fossa with however no increased free fluid or gas to suggest a biliary leak. 2. Interval decrease of small volume scattered free foci of gas within the upper abdomen likely related to postsurgical changes. 3. Persistent left lower lobe subsegmental pulmonary embolus. No associated heart strain or visualized pulmonary infarction.  Patient states he has not real history of GI disorders however underwent PD cath removal and had ex- lap lysis of adhesions with peritoneal biopsy at that time by Dr Lysle Pearl for concerns of peritonitis Feb 2023- cytology without evidence of malignancy and omental biopsies  without any granulation of malignancy; fungal tests/peritnoeal cultures negative as well as AFB negative.  States he does not take any NSAIDS and is not taking any PPI at home. States prior to his duodenal perforation this month he developed ruq pain like a tummy ache that worsened, so he came to ED and had surgery that night- felt better on Saturday when he was discharged but pain returned Sunday. Associated with nausea but no vomiting. Stools as above. Denies any further GI concerns.  He also had UGIS after his duodenal repair 04/28/22: IMPRESSION: Limited upper GI study conducted secondary to limited mobility and limited ability to tolerate contrast material. No extraluminal contrast to suggest a duodenal leak or perforation. If there is persistent clinical concern, recommend a CT of the abdomen with oral contrast only. And CT Abdomen 04/29/22: IMPRESSION: Status post surgical repair of perforated duodenal ulcer. No definite contrast extravasation is noted in the visualized portion of the abdomen currently. Surgical drain is noted in right upper quadrant. Mild pneumoperitoneum is noted consistent with postoperative status.  Small bilateral pleural effusions are noted. Probable right lower lobe subsegmental atelectasis is noted.  Bilateral renal atrophy is noted consistent with end-stage renal disease. Aortic Atherosclerosis   PREVIOUS ENDOSCOPIES:            Colonoscopy - reports he had normal colonoscopy 2018   Past Medical History:  Diagnosis Date   Aortic atherosclerosis ([place])    Bilateral carotid artery disease (HCC)    Bladder cancer ([place])    Coronary artery disease 12/20/2018  a.) LHC 12/20/2018: 50% OM1, 40% OM2, 95% o-pLAD, 75% o=pLCx, 40% mLM, 70% D1, 60% mRCA-1, 50% mRCA-2; refer to CVTS. b.) 4v CABG at [facility] on 12/27/2018: LIMA-LAD, RIMA-PDA, seg LRA-OM1-D1   DCM (dilated cardiomyopathy) ([place]) 12/05/2018   a.) TTE 12/05/2018: EF 40-45%. b.) TTE 12/28/2019: EF 20-25%.    ESRD (end stage renal disease) ([place])    a.) T-Th-Sat   HFrEF (heart failure with reduced ejection fraction) ([place]) 12/05/2018   a.) TTE 12/05/2018: EF 40-45%; mild LVH; ant/apical/sep HK; mild TR . b.) TTE 12/28/2019: EF 20-25%; mod LVH; mod MR/AR; G1DD.   History of 2019 novel coronavirus disease (COVID-19) 04/08/2021   History of kidney stones    HLD (hyperlipidemia)    Hypertension    Infrarenal abdominal aortic aneurysm (AAA) without rupture ([place]) 03/05/2021   a.) CT abd/pelvis; measured 3.2 cm.   Myocardial infarction [facility])    PVD (peripheral vascular disease) ([place])    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   a.) LIMA-LAD, RIMA-PDA, sequential LEFT radial artery to OM1 and D1   Sepsis ([place]) 03/14/2021   Wears glasses     Past Surgical History:  Procedure Laterality Date   AV FISTULA PLACEMENT Left 07/30/2021   Procedure: INSERTION OF ARTERIOVENOUS (AV) GORE-TEX GRAFT ARM BRACHIAL ARTERY TO AXILLARY VEIN;  Surgeon: Algernon Huxley, MD;  Location: [facility] ORS;  Service: Vascular;  Laterality: Left;   CAPD INSERTION N/A 12/31/2019   Procedure: LAPAROSCOPIC INSERTION CONTINUOUS AMBULATORY PERITONEAL DIALYSIS  (CAPD) CATHETER;  Surgeon: Jules Husbands, MD;  Location: [facility] OR[facility];  Service: General;  Laterality: N/A;   CAPD REMOVAL N/A 04/10/2020   Procedure: LAPAROSCOPIC REVISION OF CONTINUOUS AMBULATORY PERITONEAL DIALYSIS  (CAPD) CATHETER;  Surgeon: Jules Husbands, MD;  Location: [facility];  Service: General;  Laterality: N/A;   CORONARY ARTERY BYPASS GRAFT N/A 12/27/2018   Procedure: CORONARY ARTERY BYPASS GRAFTING (CABG) X 4 ON PUMP USING RIGHT & LEFT INTERNAL MAMMARY ARTERY LEFT RADIAL ARTERY ENDOSCOPICALLY HARVESTED;  Surgeon: [name], MD;  Location: [place];  Service: Open Heart Surgery;  Laterality: N/A;   CYSTOSCOPY W/ RETROGRADES Bilateral 05/15/2019   Procedure: CYSTOSCOPY WITH RETROGRADE PYELOGRAM;  Surgeon: Abbie Sons, MD;  Location: [facility];  Service: Urology;  Laterality: Bilateral;    CYSTOSCOPY WITH BIOPSY N/A 05/15/2019   Procedure: CYSTOSCOPY WITH bladder BIOPSY;  Surgeon: Abbie Sons, MD;  Location: [facility];  Service: Urology;  Laterality: N/A;   DIALYSIS/PERMA CATHETER INSERTION N/A 12/28/2019   Procedure: DIALYSIS/PERMA CATHETER INSERTION;  Surgeon: Algernon Huxley, MD;  Location: [place] CV LAB;  Service: Cardiovascular;  Laterality: N/A;   DIALYSIS/PERMA CATHETER INSERTION N/A 03/18/2021   Procedure: DIALYSIS/PERMA CATHETER INSERTION;  Surgeon: Algernon Huxley, MD;  Location: [place] CV LAB;  Service: Cardiovascular;  Laterality: N/A;   DIALYSIS/PERMA CATHETER REMOVAL N/A 06/02/2020   Procedure: DIALYSIS/PERMA CATHETER REMOVAL;  Surgeon: Algernon Huxley, MD;  Location: [place] [facility];  Service: Cardiovascular;  Laterality: N/A;   EXCHANGE OF A DIALYSIS CATHETER Right 04/10/2020   Procedure: EXCHANGE OF A DIALYSIS CATHETER;  Surgeon: Jules Husbands, MD;  Location: [facility];  Service: General;  Laterality: Right;   [place]  01/20/2021   Procedure: HERNIA REPAIR INCISIONAL;  Surgeon: Olean Ree, MD;  Location: [facility];  Service: General;;   IR IMAGE GUIDED DRAINAGE PERCUT CATH  PERITONEAL RETROPERIT  04/07/2020   LAPAROSCOPY N/A 04/16/2021   Procedure: LAPAROSCOPY DIAGNOSTIC;  Surgeon: Benjamine Sprague, DO;  Location: [facility]  ORS;  Service: General;  Laterality: N/A;   LAPAROTOMY N/A 04/26/2022   Procedure: EXPLORATORY LAPAROTOMY WITH REPAIR OF DUODENAL ULCER;  Surgeon: Herbert Pun, MD;  Location: [facility];  Service: General;  Laterality: N/A;   LEFT HEART CATH AND CORONARY ANGIOGRAPHY Left 12/20/2018   Procedure: LEFT HEART CATH AND CORONARY ANGIOGRAPHY;  Surgeon: Isaias Cowman, MD;  Location: [place] [facility];  Service: Cardiovascular;  Laterality: Left;   RADIAL ARTERY HARVEST Left 12/27/2018   Procedure: ENDOSCOPIC RADIAL ARTERY HARVEST;  Surgeon: [name], MD;  Location: [place];  Service: Open Heart Surgery;   Laterality: Left;   REMOVAL OF A DIALYSIS CATHETER Left 03/20/2021   Procedure: REMOVAL OF A PD CATHETER;  Surgeon: Algernon Huxley, MD;  Location: [facility];  Service: Vascular;  Laterality: Left;   REVISION OF ARTERIOVENOUS GORETEX GRAFT Left 09/11/2021   Procedure: Excisionof infected AV graft;  Surgeon: Katha Cabal, MD;  Location: [facility];  Service: Vascular;  Laterality: Left;   TEE WITHOUT CARDIOVERSION N/A 12/27/2018   Procedure: TRANSESOPHAGEAL ECHOCARDIOGRAM (TEE);  Surgeon: [name], MD;  Location: [place];  Service: Open Heart Surgery;  Laterality: N/A;   TUMOR REMOVAL  2019   Bladder    Family History  Family history unknown: Yes     Social History   Tobacco Use   Smoking status: Former    Packs/day: 0.25    Types: Cigarettes    Quit date: 02/19/2019    Years since quitting: 3.2   Smokeless tobacco: Never  Vaping Use   Vaping Use: Never used  Substance Use Topics   Alcohol use: Never   Drug use: Never    Prior to Admission medications   Medication Sig Start Date End Date Taking? Authorizing Provider  acetaminophen (TYLENOL) 500 MG tablet Take 2 tablets (1,000 mg total) by mouth every 6 (six) hours as needed for mild pain, fever or headache (or Fever >/= 101). 11/18/21  Yes Emeterio Reeve, DO  amoxicillin-clavulanate (AUGMENTIN) 500-125 MG tablet Take 1 tablet by mouth daily for 3 days. 05/01/22 05/04/22 Yes Lucienne Minks, MD  apixaban (ELIQUIS) 5 MG TABS tablet Take 1 tablet (5 mg total) by mouth 2 (two) times daily. 05/01/22  Yes Lucienne Minks, MD  atorvastatin (LIPITOR) 80 MG tablet Take 1 tablet (80 mg total) by mouth daily. 04/21/22  Yes Jennye Boroughs, MD  calcitRIOL (ROCALTROL) 0.5 MCG capsule Take 1 capsule (0.5 mcg total) by mouth daily. 11/18/21  Yes Emeterio Reeve, DO  calcium acetate (PHOSLO) 667 MG capsule Take 1 capsule (667 mg total) by mouth 3 (three) times daily with meals. 11/18/21  Yes Emeterio Reeve, DO  hydrALAZINE (APRESOLINE) 25 MG  tablet Take 1 tablet (25 mg total) by mouth 3 (three) times daily. 04/21/22  Yes Jennye Boroughs, MD  isosorbide dinitrate (ISORDIL) 10 MG tablet Take 1 tablet (10 mg total) by mouth 3 (three) times daily. 04/21/22  Yes Jennye Boroughs, MD  metoprolol succinate (TOPROL-XL) 25 MG 24 hr tablet Take 25 mg by mouth daily. 03/12/22  Yes [provider]  multivitamin (RENA-VIT) TABS tablet Take 1 tablet by mouth at bedtime. 03/23/21  Yes Nicole Kindred A, DO  pantoprazole (PROTONIX) 40 MG tablet Take 1 tablet (40 mg total) by mouth 2 (two) times daily. 05/01/22 05/31/22 Yes Lucienne Minks, MD  sucroferric oxyhydroxide (VELPHORO) 500 MG chewable tablet Chew 1 tablet (500 mg total) by mouth 3 (three) times daily with meals. 11/18/21  Yes Emeterio Reeve, DO  Vitamin  D, Ergocalciferol, (DRISDOL) 1.25 MG (50000 UNIT) CAPS capsule Take 1 capsule (50,000 Units total) by mouth every 7 (seven) days. 11/18/21  Yes Emeterio Reeve, DO    Current Facility-Administered Medications  Medication Dose Route Frequency Provider Last Rate Last Admin   acetaminophen (TYLENOL) tablet 650 mg  650 mg Oral Q6H PRN Athena Masse, MD       Or   acetaminophen (TYLENOL) suppository 650 mg  650 mg Rectal Q6H PRN Athena Masse, MD       Chlorhexidine Gluconate Cloth 2 % PADS 6 each  6 each Topical Q0600 Kolluru, Sarath, MD       hydrALAZINE (APRESOLINE) injection 5 mg  5 mg Intravenous Q4H PRN Foust, Katy L, NP       morphine (PF) 2 MG/ML injection 2 mg  2 mg Intravenous Q2H PRN Athena Masse, MD   2 mg at 05/04/22 0525   ondansetron (ZOFRAN) tablet 4 mg  4 mg Oral Q6H PRN Athena Masse, MD       Or   ondansetron [facility]) injection 4 mg  4 mg Intravenous Q6H PRN Athena Masse, MD       pantoprazole (PROTONIX) injection 40 mg  40 mg Intravenous Q24H Judd Gaudier V, MD   40 mg at 05/04/22 0153   piperacillin-tazobactam (ZOSYN) IVPB 2.25 g  2.25 g Intravenous Q8H Athena Masse, MD   Stopped at 05/04/22 [phone]    Current Outpatient Medications  Medication Sig Dispense Refill   acetaminophen (TYLENOL) 500 MG tablet Take 2 tablets (1,000 mg total) by mouth every 6 (six) hours as needed for mild pain, fever or headache (or Fever >/= 101). 30 tablet 0   amoxicillin-clavulanate (AUGMENTIN) 500-125 MG tablet Take 1 tablet by mouth daily for 3 days. 3 tablet 0   apixaban (ELIQUIS) 5 MG TABS tablet Take 1 tablet (5 mg total) by mouth 2 (two) times daily. 60 tablet 0   atorvastatin (LIPITOR) 80 MG tablet Take 1 tablet (80 mg total) by mouth daily.     calcitRIOL (ROCALTROL) 0.5 MCG capsule Take 1 capsule (0.5 mcg total) by mouth daily. 30 capsule 0   calcium acetate (PHOSLO) 667 MG capsule Take 1 capsule (667 mg total) by mouth 3 (three) times daily with meals. 30 capsule 0   hydrALAZINE (APRESOLINE) 25 MG tablet Take 1 tablet (25 mg total) by mouth 3 (three) times daily. 90 tablet 0   isosorbide dinitrate (ISORDIL) 10 MG tablet Take 1 tablet (10 mg total) by mouth 3 (three) times daily. 30 tablet 0   metoprolol succinate (TOPROL-XL) 25 MG 24 hr tablet Take 25 mg by mouth daily.     multivitamin (RENA-VIT) TABS tablet Take 1 tablet by mouth at bedtime. 30 tablet 1   pantoprazole (PROTONIX) 40 MG tablet Take 1 tablet (40 mg total) by mouth 2 (two) times daily. 60 tablet 0   sucroferric oxyhydroxide (VELPHORO) 500 MG chewable tablet Chew 1 tablet (500 mg total) by mouth 3 (three) times daily with meals. 90 tablet 0   Vitamin D, Ergocalciferol, (DRISDOL) 1.25 MG (50000 UNIT) CAPS capsule Take 1 capsule (50,000 Units total) by mouth every 7 (seven) days. 5 capsule 0    Allergies as of 05/03/2022   (No Known Allergies)     Review of Systems:    All systems reviewed and negative except where noted in HPI.      Physical Exam:  Vital signs in last 24 hours: Temp:  Reina.Alexander F (  36.7 C)-98.6 F (37 C)] 98.4 F (36.9 C) (03/12 0730) Pulse Rate:  [70-80] 79 (03/12 0830) Resp:  [10-22] 10 (03/12 0830) BP:  (154-226)/(90-143) 168/111 (03/12 0830) SpO2:  [92 %-100 %] 95 % (03/12 0830) Weight:  [62.1 kg] 62.1 kg (03/11 2045) Last BM Date : 05/03/22 General:   Pleasant man in NAD Head:  Normocephalic and atraumatic. Eyes:   No icterus.   Conjunctiva pink. Ears:  Normal auditory acuity. Mouth: Mucosa pink moist, no lesions. Neck:  Supple; no masses felt Lungs:  Respirations even and unlabored. Lungs clear to auscultation bilaterally.   No wheezes, crackles, or rhonchi.  Heart:  S1S2, RRR, no MRG. No edema. Abdomen:   Flat, soft, nondistended, nontender. Normal bowel sounds. No appreciable masses or hepatomegaly. No rebound signs or other peritoneal signs. Rectal:  Not performed.  Msk:  MAEW x4, No clubbing or cyanosis. Strength 5/5. Symmetrical without gross deformities. Neurologic:  Alert and  oriented x4;  Cranial nerves II-XII intact.  Skin:  Warm, dry, pink without significant lesions or rashes. Psych:  Alert and cooperative. Normal affect.  LAB RESULTS: Recent Labs    05/03/22 2234 05/04/22 0507  WBC 14.5*  --   HGB 9.3* 10.2*  HCT 31.6*  --   PLT 312  --    BMET Recent Labs    05/03/22 2234 05/04/22 0507  NA 137  --   K 6.1* 5.5*  CL 101  --   CO2 24  --   GLUCOSE 87  --   BUN 69*  --   CREATININE 9.97*  --   CALCIUM 8.5*  --    LFT Recent Labs    05/03/22 2234  PROT 6.2*  ALBUMIN 2.7*  AST 14*  ALT 8  ALKPHOS 47  BILITOT 0.6   PT/INR No results for input(s): "LABPROT", "INR" in the last 72 hours.  STUDIES: CT Abdomen Pelvis W Contrast  Result Date: 05/03/2022 CLINICAL DATA:  Abdominal pain, post-op reporting pain to abdominal incision site that has worsened since getting discharged from hospital 05/01/22. States he had surgery on March 4th for perforated bowel. EXAM: CT ABDOMEN AND PELVIS WITH CONTRAST TECHNIQUE: Multidetector CT imaging of the abdomen and pelvis was performed using the standard protocol following bolus administration of intravenous  contrast. RADIATION DOSE REDUCTION: This exam was performed according to the departmental dose-optimization program which includes automated exposure control, adjustment of the mA and/or kV according to patient size and/or use of iterative reconstruction technique. CONTRAST:  43m OMNIPAQUE IOHEXOL 350 MG/ML SOLN COMPARISON:  CT abdomen pelvis 04/29/2022, CT abdomen pelvis 04/26/2022, CT angio chest and abdomen pelvis 04/19/2022 FINDINGS: Lower chest: Left lower lobe subsegmental pulmonary artery filling defect consistent with pulmonary embolus. 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 Slightly more organized 3.5 x 1.9 cm organized fluid collection along the posterior inferior right hepatic lobe likely representing an abscess (6:47). Associated trace perihepatic simple free fluid. Redemonstration of adjacent fat density of unclear etiology (6:40)- possibly postsurgical in the setting of duodenal ulcer perforation repair. Associated mild pericholecystic free fluid-not increased from prior. No gallbladder wall thickening. No CT evidence of cholelithiasis. No biliary dilatation. Pancreas: No focal lesion. Normal pancreatic contour. No surrounding inflammatory changes. No main pancreatic ductal dilatation. Spleen: Normal in size without focal abnormality. Adrenals/Urinary Tract: No adrenal nodule bilaterally. Bilateral atrophic kidneys. Bilateral kidneys enhance symmetrically. Subcentimeter hypodensities are too small to characterize. No hydronephrosis. No hydroureter. The urinary bladder is unremarkable. Stomach/Bowel:  PO contrast reaches the rectum. Stomach is within normal limits. No evidence of bowel wall thickening or dilatation. Appendix appears normal. Vascular/Lymphatic: No abdominal aorta or iliac aneurysm. Severe atherosclerotic plaque of the aorta and its branches. No abdominal, pelvic, or inguinal lymphadenopathy. Reproductive: Prostate is unremarkable.  Other: Stranding of the right pericolic gutter with associated trace fluid. Mild perihepatic simple free fluid. Mild pericholecystic simple free fluid. Interval decrease of small volume scattered free foci of gas within the upper abdomen likely related to postsurgical changes. No organized fluid collection. Musculoskeletal: No abdominal wall hernia or abnormality. No suspicious lytic or blastic osseous lesions. No acute displaced fracture. Levoscoliosis of the thoracolumbar spine. IMPRESSION: 1. Slightly more organized 3.5 x 1.9 cm organized fluid collection along the posterior inferior right hepatic lobe likely representing an abscess. Persistent grossly stable associated right upper quadrant and pericolic gutter trace free fluid and inflammatory changes. Finding does appear to be contiguous with the gallbladder fossa with however no increased free fluid or gas to suggest a biliary leak. 2. Interval decrease of small volume scattered free foci of gas within the upper abdomen likely related to postsurgical changes. 3. Persistent left lower lobe subsegmental pulmonary embolus. No associated heart strain or visualized pulmonary infarction. Electronically Signed   By: Iven Finn M.D.   On: 05/03/2022 22:51       Impression / Plan:   Melena in the setting of post surgical duodenal repair. May have had some bleeding into the intestine from surgery last week and it is now passing and/or mild ischemia. His hemoglobin is very stable and he is on PPI. Would continue the ppi and discharge. Do think he would benefit from egd in near future but this can be done as o/p in the next 6-8w when current hepatic abscess resolved.  Thank you very much for this consult. These services were provided by Stephens November, NP-C, in collaboration with Lesly Rubenstein MD, with whom I have discussed this patient in full.   Stephens November, NP-C

## 2022-05-04 NOTE — Assessment & Plan Note (Addendum)
Patient had 2 episodes of black stool, but no bowel movements here. Will restart Eliquis as outpatient.  Last hemoglobin 8.9.  Recommend checking a CBC and follow-up appointment.

## 2022-05-04 NOTE — Assessment & Plan Note (Addendum)
Continue metoprolol. 

## 2022-05-04 NOTE — ED Notes (Signed)
Family updated as to patient's status of admission as well as plan of care.

## 2022-05-04 NOTE — Hospital Course (Addendum)
Taken from H&P.  Anthony Hess is a 67 y.o. male with medical history significant for HTN, HLD, CAD s/p CABG,  chronic systolic CHF, paroxysmal A-fib and PE (04/19/22) on Eliquis, PVD, ESRD on HD TTS schedule, infrarenal abdominal aortic aneurysm without rupture, hospitalized from  3/4 to 05/01/22 with duodenal perforation, repaired on 04/26/2022, who returns to the ED with a complaint of right upper quadrant pain, nausea as well as 2 episodes of black stool.  He denies vomiting, fever or chills.  ED course and data reviewed: BP 176/111 with otherwise normal vitals.  Labs notable for WBC of 14.5.  Lactic acid 0.7.  Hemoglobin at baseline at 9.7.  Lipase 57, with normal LFTs.  Potassium 6.1. EKG, personally viewed and interpreted showing NSR at 79 with incomplete right bundle branch block and no ischemic ST-T wave changes. CT abdomen and pelvis shows a small abscess right hepatic lobe. Finding does appear to be contiguous with the gallbladder fossa with however no increased free fluid or gas to suggest a biliary leak.  Also noted to have persistent left lower lobe subsegmental PE.  Patient was given calcium gluconate for hyperkalemia and also started on Zosyn. Surgery was consulted.  Percutaneous drain placement by IR ordered.  Home Eliquis was held.  3/12: Blood pressure elevated at 168/111.  Potassium improved to 5.5.  Nephrology was consulted for routine dialysis which will be done today.  Hemoglobin seems stable at 10.2.  Preliminary blood cultures negative in 12 hours. 3/13.  Interventional radiology procedure for fluid collection drainage. 3/14.  Patient having abdominal pain 8 out of 10 in intensity 3/15.  Patient feeling better.  Patient will go back on Eliquis as outpatient.  Small prescription of pain pills for few days upon going home.  Staples removed by general surgery.

## 2022-05-04 NOTE — Progress Notes (Signed)
Hemodialysis Note  Received patient in bed to unit. Alert and oriented. Informed consent signed and in chart.   Treatment initiated:1009 Treatment completed:1354  Patient tolerated treatment well. Transported back to the room alert, without acute distress. Report given to patient's RN.  Access used:RIJ CVC  Access issues: None   Total UF removed:1500 ml Medications given Heparin    Post HD VS: Stable  Post HD weight: 53.1 kg   Forrest Moron, RN  [facility]

## 2022-05-04 NOTE — ED Notes (Addendum)
Pt O2 sat dipping down to 89% while sleeping. Pt encouraged to take deep breaths, 2L [place] placed at this time.

## 2022-05-04 NOTE — Progress Notes (Addendum)
No charge progress note.  Anthony Hess is a 67 y.o. male with medical history significant for HTN, HLD, CAD s/p CABG,  chronic systolic CHF, paroxysmal A-fib and PE (04/19/22) on Eliquis, PVD, ESRD on HD TTS schedule, infrarenal abdominal aortic aneurysm without rupture, hospitalized from  3/4 to 05/01/22 with duodenal perforation, repaired on 04/26/2022, who returns to the ED with a complaint of right upper quadrant pain, nausea as well as 2 episodes of black stool.  He denies vomiting, fever or chills.  ED course and data reviewed: BP 176/111 with otherwise normal vitals.  Labs notable for WBC of 14.5.  Lactic acid 0.7.  Hemoglobin at baseline at 9.7.  Lipase 57, with normal LFTs.  Potassium 6.1. EKG, personally viewed and interpreted showing NSR at 79 with incomplete right bundle branch block and no ischemic ST-T wave changes. CT abdomen and pelvis shows a small abscess right hepatic lobe. Finding does appear to be contiguous with the gallbladder fossa with however no increased free fluid or gas to suggest a biliary leak.  Also noted to have persistent left lower lobe subsegmental PE.  Patient was given calcium gluconate for hyperkalemia and also started on Zosyn. Surgery was consulted.  Percutaneous drain placement by IR ordered.  Home Eliquis was held.  3/12: Blood pressure elevated at 168/111.  Potassium improved to 5.5.  Nephrology was consulted for routine dialysis which will be done today.  Hemoglobin seems stable at 10.2.  Preliminary blood cultures negative in 12 hours.  Patient was seen during dialysis today.  Worsening right upper quadrant pain.  No nausea or vomiting.  On exam he has pretty tender right upper quadrant.  Most likely will be getting percutaneous drain placement by IR tomorrow.  Will need cultures with drain placement. Continue current antibiotics.  Continue holding Eliquis  Appreciate general surgery help.

## 2022-05-04 NOTE — Assessment & Plan Note (Signed)
DuoNebs as needed 

## 2022-05-05 ENCOUNTER — Inpatient Hospital Stay: Payer: Medicare Other

## 2022-05-05 DIAGNOSIS — D631 Anemia in chronic kidney disease: Secondary | ICD-10-CM

## 2022-05-05 DIAGNOSIS — T8143XA Infection following a procedure, organ and space surgical site, initial encounter: Secondary | ICD-10-CM | POA: Diagnosis not present

## 2022-05-05 DIAGNOSIS — K921 Melena: Secondary | ICD-10-CM | POA: Diagnosis not present

## 2022-05-05 DIAGNOSIS — E875 Hyperkalemia: Secondary | ICD-10-CM

## 2022-05-05 DIAGNOSIS — I5042 Chronic combined systolic (congestive) and diastolic (congestive) heart failure: Secondary | ICD-10-CM

## 2022-05-05 DIAGNOSIS — J438 Other emphysema: Secondary | ICD-10-CM

## 2022-05-05 DIAGNOSIS — I160 Hypertensive urgency: Secondary | ICD-10-CM

## 2022-05-05 DIAGNOSIS — N186 End stage renal disease: Secondary | ICD-10-CM

## 2022-05-05 DIAGNOSIS — I480 Paroxysmal atrial fibrillation: Secondary | ICD-10-CM

## 2022-05-05 DIAGNOSIS — Z992 Dependence on renal dialysis: Secondary | ICD-10-CM

## 2022-05-05 LAB — HEPATITIS B SURFACE ANTIGEN: Hepatitis B Surface Ag: NONREACTIVE

## 2022-05-05 LAB — POTASSIUM: Potassium: 4.9 mmol/L (ref 3.5–5.1)

## 2022-05-05 MED ORDER — FENTANYL CITRATE (PF) 100 MCG/2ML IJ SOLN
INTRAMUSCULAR | Status: AC
  Filled 2022-05-05: qty 2

## 2022-05-05 MED ORDER — ISOSORBIDE DINITRATE 10 MG PO TABS
10.0000 mg | ORAL_TABLET | Freq: Three times a day (TID) | ORAL | Status: DC
  Administered 2022-05-05 – 2022-05-07 (×7): 10 mg via ORAL
  Filled 2022-05-05 (×7): qty 1

## 2022-05-05 MED ORDER — MIDAZOLAM HCL 5 MG/5ML IJ SOLN
INTRAMUSCULAR | Status: AC | PRN
  Administered 2022-05-05: 1 mg via INTRAVENOUS

## 2022-05-05 MED ORDER — AMLODIPINE BESYLATE 5 MG PO TABS
2.5000 mg | ORAL_TABLET | Freq: Every day | ORAL | Status: DC
  Administered 2022-05-05 – 2022-05-07 (×3): 2.5 mg via ORAL
  Filled 2022-05-05 (×4): qty 1

## 2022-05-05 MED ORDER — FENTANYL CITRATE (PF) 100 MCG/2ML IJ SOLN
INTRAMUSCULAR | Status: AC | PRN
  Administered 2022-05-05: 50 ug via INTRAVENOUS

## 2022-05-05 MED ORDER — CALCITRIOL 0.25 MCG PO CAPS
0.5000 ug | ORAL_CAPSULE | Freq: Every day | ORAL | Status: DC
  Administered 2022-05-05 – 2022-05-07 (×3): 0.5 ug via ORAL
  Filled 2022-05-05 (×4): qty 2

## 2022-05-05 MED ORDER — RENA-VITE PO TABS
1.0000 | ORAL_TABLET | Freq: Every day | ORAL | Status: DC
  Administered 2022-05-05 – 2022-05-06 (×2): 1 via ORAL
  Filled 2022-05-05 (×2): qty 1

## 2022-05-05 MED ORDER — MIDAZOLAM HCL 2 MG/2ML IJ SOLN
INTRAMUSCULAR | Status: AC
  Filled 2022-05-05: qty 2

## 2022-05-05 MED ORDER — CALCIUM ACETATE (PHOS BINDER) 667 MG PO CAPS
667.0000 mg | ORAL_CAPSULE | Freq: Three times a day (TID) | ORAL | Status: DC
  Administered 2022-05-05 – 2022-05-07 (×5): 667 mg via ORAL
  Filled 2022-05-05 (×5): qty 1

## 2022-05-05 MED ORDER — METOPROLOL SUCCINATE ER 25 MG PO TB24
25.0000 mg | ORAL_TABLET | Freq: Every day | ORAL | Status: DC
  Administered 2022-05-05 – 2022-05-07 (×3): 25 mg via ORAL
  Filled 2022-05-05 (×4): qty 1

## 2022-05-05 MED ORDER — HYDRALAZINE HCL 25 MG PO TABS
25.0000 mg | ORAL_TABLET | Freq: Three times a day (TID) | ORAL | Status: DC
  Administered 2022-05-05 – 2022-05-07 (×7): 25 mg via ORAL
  Filled 2022-05-05 (×8): qty 1

## 2022-05-05 NOTE — Progress Notes (Signed)
[facility]  ROUNDING NOTE   Subjective:   Anthony Hess is a 67 year old male with past medical conditions including hypertension, PVD, dyslipidemia, CAD, four-vessel CABG, and end-stage renal disease on hemodialysis.  Patient presents to the emergency department with complaints of abdominal pain and has been admitted for Melena [K92.1] Hyperkalemia [E87.5] Right upper quadrant abdominal pain [R10.11] Postprocedural intraabdominal abscess [T81.43XA]  Patient is known to our practice from previous admissions and currently receives outpatient dialysis treatments at [facility] on a TTS schedule, supervised by Dr. Holley Raring.  Patient states he began having increased abdominal pain since discharge on 05/01/2022.  Patient denies heavy lifting.  Denies nausea, vomiting, or diarrhea.  Denies chest pain.  No shortness of breath.  Pain not well-controlled with prescribed medications.  Appetite remains poor.  Patient seen laying in bed NPO for procedure Mild abd pain  Dialysis tolerated well yesterday  Objective:  Vital signs in last 24 hours:  Temp:  [97.8 F (36.6 C)-98.7 F (37.1 C)] 97.8 F (36.6 C) (03/13 1241) Pulse Rate:  [68-87] 71 (03/13 1241) Resp:  [16-19] 18 (03/13 1241) BP: (116-177)/(86-118) 146/98 (03/13 1241) SpO2:  [92 %-98 %] 94 % (03/13 1241) Weight:  [53.1 kg] 53.1 kg (03/12 1349)  Weight change: -5.043 kg Filed Weights   05/03/22 2045 05/04/22 1000 05/04/22 1349  Weight: 62.1 kg 57.1 kg 53.1 kg    Intake/Output: I/O last 3 completed shifts: In: 340.2 [P.O.:240; IV Piggyback:100.2] Out: 1.5 [Other:1.5]   Intake/Output this shift:  No intake/output data recorded.  Physical Exam: General: NAD  Head: Normocephalic, atraumatic. Moist oral mucosal membranes  Eyes: Anicteric  Lungs:  Clear to auscultation, normal effort  Heart: Regular rate and rhythm  Abdomen:  Soft, tender  Extremities: No peripheral edema.  Neurologic: Alert and oriented,   moving all four extremities  Skin: No lesions  Access: Right PermCath    Basic Metabolic Panel: Recent Labs  Lab 04/29/22 0407 04/30/22 0908 05/01/22 0611 05/03/22 2234 05/04/22 0507 05/05/22 0535  NA 136 135 141 137  --   --   K 5.7* 4.2 4.3 6.1* 5.5* 4.9  CL 94* 95* 101 101  --   --   CO2 '24 24 25 24  '$ --   --   GLUCOSE 80 92 97 87  --   --   BUN 68* 44* 58* 69*  --   --   CREATININE 9.00* 7.08* 9.50* 9.97*  --   --   CALCIUM 7.2* 7.8* 7.7* 8.5*  --   --   MG 2.3 2.1 2.4  --   --   --   PHOS 9.7* 7.3* 8.9*  --   --   --      Liver Function Tests: Recent Labs  Lab 04/30/22 0908 05/03/22 2234  AST 14* 14*  ALT <5 8  ALKPHOS 46 47  BILITOT 0.8 0.6  PROT 6.2* 6.2*  ALBUMIN 2.6* 2.7*    Recent Labs  Lab 05/03/22 2234  LIPASE 57*    No results for input(s): "AMMONIA" in the last 168 hours.  CBC: Recent Labs  Lab 04/29/22 0407 04/30/22 0908 05/01/22 0611 05/03/22 2234 05/04/22 0507  WBC 13.3* 9.6 8.3 14.5*  --   NEUTROABS  --   --   --  11.5*  --   HGB 10.9* 10.2* 9.3* 9.3* 10.2*  HCT 35.2* 32.7* 29.8* 31.6*  --   MCV 93.4 91.6 92.5 98.1  --   PLT 297 324 323 312  --  Cardiac Enzymes: No results for input(s): "CKTOTAL", "CKMB", "CKMBINDEX", "TROPONINI" in the last 168 hours.  BNP: Invalid input(s): "POCBNP"  CBG: No results for input(s): "GLUCAP" in the last 168 hours.  Microbiology: Results for orders placed or performed during the hospital encounter of 05/03/22  Culture, blood (routine x 2)     Status: None (Preliminary result)   Collection Time: 05/04/22 12:09 AM   Specimen: BLOOD  Result Value Ref Range Status   Specimen Description BLOOD  RAC  Final   Special Requests   Final    BOTTLES DRAWN AEROBIC AND ANAEROBIC Blood Culture results may not be optimal due to an inadequate volume of blood received in culture bottles   Culture   Final    NO GROWTH 1 DAY Performed at [facility], [address], [place], [place]  [address]    Report Status PENDING  Incomplete  Culture, blood (routine x 2)     Status: None (Preliminary result)   Collection Time: 05/04/22 12:10 AM   Specimen: BLOOD  Result Value Ref Range Status   Specimen Description BLOOD  RIGHT FOREARM  Final   Special Requests   Final    BOTTLES DRAWN AEROBIC AND ANAEROBIC Blood Culture adequate volume   Culture   Final    NO GROWTH 1 DAY Performed at [facility], [address], [place], [place] [address]    Report Status PENDING  Incomplete    Coagulation Studies: No results for input(s): "LABPROT", "INR" in the last 72 hours.   Urinalysis: No results for input(s): "COLORURINE", "LABSPEC", "PHURINE", "GLUCOSEU", "HGBUR", "BILIRUBINUR", "KETONESUR", "PROTEINUR", "UROBILINOGEN", "NITRITE", "LEUKOCYTESUR" in the last 72 hours.  Invalid input(s): "APPERANCEUR"    Imaging: CT Abdomen Pelvis W Contrast  Result Date: 05/03/2022 CLINICAL DATA:  Abdominal pain, post-op reporting pain to abdominal incision site that has worsened since getting discharged from hospital 05/01/22. States he had surgery on March 4th for perforated bowel. EXAM: CT ABDOMEN AND PELVIS WITH CONTRAST TECHNIQUE: Multidetector CT imaging of the abdomen and pelvis was performed using the standard protocol following bolus administration of intravenous contrast. RADIATION DOSE REDUCTION: This exam was performed according to the departmental dose-optimization program which includes automated exposure control, adjustment of the mA and/or kV according to patient size and/or use of iterative reconstruction technique. CONTRAST:  89m OMNIPAQUE IOHEXOL 350 MG/ML SOLN COMPARISON:  CT abdomen pelvis 04/29/2022, CT abdomen pelvis 04/26/2022, CT angio chest and abdomen pelvis 04/19/2022 FINDINGS: Lower chest: Left lower lobe subsegmental pulmonary artery filling defect consistent with pulmonary embolus. Hepatobiliary: 3 cm right hepatic dome hemangioma (6:49). Couple hepatic cysts  again noted. Slightly more organized 3.5 x 1.9 cm organized fluid collection along the posterior inferior right hepatic lobe likely representing an abscess (6:47). Associated trace perihepatic simple free fluid. Redemonstration of adjacent fat density of unclear etiology (6:40)- possibly postsurgical in the setting of duodenal ulcer perforation repair. Associated mild pericholecystic free fluid-not increased from prior. No gallbladder wall thickening. No CT evidence of cholelithiasis. No biliary dilatation. Pancreas: No focal lesion. Normal pancreatic contour. No surrounding inflammatory changes. No main pancreatic ductal dilatation. Spleen: Normal in size without focal abnormality. Adrenals/Urinary Tract: No adrenal nodule bilaterally. Bilateral atrophic kidneys. Bilateral kidneys enhance symmetrically. Subcentimeter hypodensities are too small to characterize. No hydronephrosis. No hydroureter. The urinary bladder is unremarkable. Stomach/Bowel: PO contrast reaches the rectum. Stomach is within normal limits. No evidence of bowel wall thickening or dilatation. Appendix appears normal. Vascular/Lymphatic: No abdominal aorta or iliac aneurysm. Severe atherosclerotic plaque  of the aorta and its branches. No abdominal, pelvic, or inguinal lymphadenopathy. Reproductive: Prostate is unremarkable. Other: Stranding of the right pericolic gutter with associated trace fluid. Mild perihepatic simple free fluid. Mild pericholecystic simple free fluid. Interval decrease of small volume scattered free foci of gas within the upper abdomen likely related to postsurgical changes. No organized fluid collection. Musculoskeletal: No abdominal wall hernia or abnormality. No suspicious lytic or blastic osseous lesions. No acute displaced fracture. Levoscoliosis of the thoracolumbar spine. IMPRESSION: 1. Slightly more organized 3.5 x 1.9 cm organized fluid collection along the posterior inferior right hepatic lobe likely representing an  abscess. Persistent grossly stable associated right upper quadrant and pericolic gutter trace free fluid and inflammatory changes. Finding does appear to be contiguous with the gallbladder fossa with however no increased free fluid or gas to suggest a biliary leak. 2. Interval decrease of small volume scattered free foci of gas within the upper abdomen likely related to postsurgical changes. 3. Persistent left lower lobe subsegmental pulmonary embolus. No associated heart strain or visualized pulmonary infarction. Electronically Signed   By: Iven Finn M.D.   On: 05/03/2022 22:51     Medications:    piperacillin-tazobactam (ZOSYN)  IV 2.25 g (05/05/22 0553)     calcitRIOL  0.5 mcg Oral Daily   calcium acetate  667 mg Oral TID WC   Chlorhexidine Gluconate Cloth  6 each Topical Q0600   hydrALAZINE  25 mg Oral TID   isosorbide dinitrate  10 mg Oral TID   metoprolol succinate  25 mg Oral Daily   multivitamin  1 tablet Oral QHS   pantoprazole (PROTONIX) IV  40 mg Intravenous Q24H   acetaminophen **OR** acetaminophen, morphine injection, ondansetron **OR** ondansetron (ZOFRAN) IV  Assessment/ Plan:  Anthony Hess is a 67 y.o.  male  Anthony Hess is a 67 year old male with past medical conditions including hypertension, PVD, dyslipidemia, CAD, four-vessel CABG, and end-stage renal disease on hemodialysis.  Patient presents to the emergency department with complaints of shortness of breath and has been admitted for Melena [K92.1] Hyperkalemia [E87.5] Right upper quadrant abdominal pain [R10.11] Postprocedural intraabdominal abscess [T81.43XA]  [facility]/TTS/right PermCath.  End-stage renal disease with hyperkalemia on hemodialysis.    Potassium stable, dialysis received yesterday, UF 1.5L achieved. Next treatment scheduled for Thursday.   2. Anemia of chronic kidney disease Lab Results  Component Value Date   HGB 10.2 (L) 05/04/2022    Patient receives Mircera at  outpatient clinic.  Hemoglobin stable.  Patient reports dark stools.  GI will evaluate for EGD outpatient.   3. Secondary Hyperparathyroidism: with outpatient labs: PTH 532, phosphorus 10.7, calcium 6.1 on 02/11/22.  :  Lab Results  Component Value Date   PTH 159 (H) 12/27/2019   CALCIUM 8.5 (L) 05/03/2022   CAION 0.84 (LL) 09/11/2021   PHOS 8.9 (H) 05/01/2022    Hyperphosphatemia noted.  Patient prescribed calcium acetate and calcitriol outpatient.  Continue calcium acetate with meals.   4.  Hypertension with chronic kidney disease.  Home regimen includes amlodipine, clonidine, metoprolol, Entresto and losartan.  Currently receiving hydralazine, isosorbide, metoprolol, and Entresto.  Blood pressure acceptable for this patient  5.  Abscess, hepatic lobe seen on CT abdomen pelvis.  Surgery feels medical management is sufficient.  Continue antibiotics per primary team.   LOS: 1 [place] 3/13/202412:43 PM

## 2022-05-05 NOTE — Progress Notes (Signed)
Patient clinically stable post CT aspiration, serous dk pink return,not purulent thus no drain per Dr Earleen Newport. Vitals stable pre and post procedure. Received Versed 1 mg along with Fentanyl 50 mcg IV for procedure. Report given to Jequetta/Michele RN post procedure/330

## 2022-05-05 NOTE — TOC Progression Note (Addendum)
Transition of Care [facility]) - Progression Note    Patient Details  Name: Anthony Hess MRN: [id number] Date of Birth: [birth year]  Transition of Care [facility]) CM/SW Contact  Laurena Slimmer, RN Phone Number: 05/05/2022, 4:17 PM  Clinical Narrative:    Case reviewed for DME needs and changes in discharge disposition.  Patient request to speak with RNCM.  Patient not in room.         Expected Discharge Plan and Services                                               Social Determinants of Health (SDOH) Interventions SDOH Screenings   Food Insecurity: No Food Insecurity (04/27/2022)  Housing: Low Risk  (04/27/2022)  Transportation Needs: No Transportation Needs (04/27/2022)  Utilities: Not At Risk (04/27/2022)  Depression (PHQ2-9): Low Risk  (01/11/2020)  Tobacco Use: Medium Risk (04/27/2022)    Readmission Risk Interventions    04/28/2022    2:26 PM 11/18/2021    2:04 PM 04/09/2021    1:32 PM  Readmission Risk Prevention Plan  Transportation Screening Complete Complete Complete  Palliative Care Screening   Not Applicable  Medication Review (RN Care Manager) Complete Complete Complete  PCP or Specialist appointment within 3-5 days of discharge Complete Complete   HRI or [place] Complete    SW Recovery Care/Counseling Consult Complete Complete   Palliative Care Screening Not Applicable Not [place] Not Applicable Not Applicable

## 2022-05-05 NOTE — Procedures (Addendum)
Interventional Radiology Procedure Note  Procedure: Image guided aspiration of subhepatic fluid. ~10cc of serosanguinous fluid. No frank pus  Complications: None  EBL: None Sample: Culture sent  Recommendations: - Routine wound - follow up Cx - ok to advance diet per primary order Ok to restart anticoagulation as needed  Signed,  Dulcy Fanny. Earleen Newport, DO

## 2022-05-05 NOTE — Assessment & Plan Note (Signed)
   Improved with dialysis. 

## 2022-05-05 NOTE — Progress Notes (Signed)
Patient ID: Anthony Hess, male   DOB: [birth year], 67 y.o.   MRN: [id number]     [facility] Day(s): 1.   Interval History: Patient seen and examined, no acute events or new complaints overnight. Patient reports feeling sore in the right upper quadrant.  Denies any nausea or vomiting.  Denies any fever.  Vital signs in last 24 hours: [min-max] current  Temp:  [97.6 F (36.4 C)-98.7 F (37.1 C)] 97.6 F (36.4 C) (03/13 1337) Pulse Rate:  [68-80] 68 (03/13 1337) Resp:  [16-18] 18 (03/13 1337) BP: (116-177)/(86-118) 136/104 (03/13 1337) SpO2:  [90 %-96 %] 90 % (03/13 1337)     Height: '5\' 3"'$  (160 cm) Weight: 53.1 kg BMI (Calculated): 20.74   Physical Exam:  Constitutional: alert, cooperative and no distress  Respiratory: breathing non-labored at rest  Cardiovascular: regular rate and sinus rhythm  Gastrointestinal: soft, non-tender, and non-distended.  The wounds are clean  Labs:     Latest Ref Rng & Units 05/04/2022    5:07 AM 05/03/2022   10:34 PM 05/01/2022    6:11 AM  CBC  WBC 4.0 - 10.5 K/uL  14.5  8.3   Hemoglobin 13.0 - 17.0 g/dL 10.2  9.3  9.3   Hematocrit 39.0 - 52.0 %  31.6  29.8   Platelets 150 - 400 K/uL  312  323       Latest Ref Rng & Units 05/05/2022    5:35 AM 05/04/2022    5:07 AM 05/03/2022   10:34 PM  CMP  Glucose 70 - 99 mg/dL   87   BUN 8 - 23 mg/dL   69   Creatinine 0.61 - 1.24 mg/dL   9.97   Sodium 135 - 145 mmol/L   137   Potassium 3.5 - 5.1 mmol/L 4.9  5.5  6.1   Chloride 98 - 111 mmol/L   101   CO2 22 - 32 mmol/L   24   Calcium 8.9 - 10.3 mg/dL   8.5   Total Protein 6.5 - 8.1 g/dL   6.2   Total Bilirubin 0.3 - 1.2 mg/dL   0.6   Alkaline Phos 38 - 126 U/L   47   AST 15 - 41 U/L   14   ALT 0 - 44 U/L   8     Imaging studies: No new pertinent imaging studies   Assessment/Plan:  67 y.o. male with intra-abdominal abscess, complicated by pertinent comorbidities including end-stage renal disease on hemodialysis, hypertension,  recent duodenal perforation.   -Pending IR drainage of intra-abdominal abscess -May restart diet after procedure -Continue to biotic therapy -Continue medical management of medical comorbidities.  Arnold Long, MD

## 2022-05-05 NOTE — Progress Notes (Signed)
Progress Note   Patient: Anthony Hess [id number] DOB: 1955/08/12 DOA: 05/03/2022     1 DOS: the patient was seen and examined on 05/05/2022   Brief hospital course: Taken from H&P.  Anthony Hess is a 67 y.o. male with medical history significant for HTN, HLD, CAD s/p CABG,  chronic systolic CHF, paroxysmal A-fib and PE (04/19/22) on Eliquis, PVD, ESRD on HD TTS schedule, infrarenal abdominal aortic aneurysm without rupture, hospitalized from  3/4 to 05/01/22 with duodenal perforation, repaired on 04/26/2022, who returns to the ED with a complaint of right upper quadrant pain, nausea as well as 2 episodes of black stool.  He denies vomiting, fever or chills.  ED course and data reviewed: BP 176/111 with otherwise normal vitals.  Labs notable for WBC of 14.5.  Lactic acid 0.7.  Hemoglobin at baseline at 9.7.  Lipase 57, with normal LFTs.  Potassium 6.1. EKG, personally viewed and interpreted showing NSR at 79 with incomplete right bundle branch block and no ischemic ST-T wave changes. CT abdomen and pelvis shows a small abscess right hepatic lobe. Finding does appear to be contiguous with the gallbladder fossa with however no increased free fluid or gas to suggest a biliary leak.  Also noted to have persistent left lower lobe subsegmental PE.  Patient was given calcium gluconate for hyperkalemia and also started on Zosyn. Surgery was consulted.  Percutaneous drain placement by IR ordered.  Home Eliquis was held.  3/12: Blood pressure elevated at 168/111.  Potassium improved to 5.5.  Nephrology was consulted for routine dialysis which will be done today.  Hemoglobin seems stable at 10.2.  Preliminary blood cultures negative in 12 hours.   Assessment and Plan: * Postprocedural intraabdominal abscess S/p laparotomy for for perforated duodenal perforation on 3/4 Patient presents with abdominal pain and had 2 melanotic stools CT abdomen showing right hepatic lobe abscess IR to do a surgical  drainage procedure today.  Continue n.p.o. until after procedure.  Continue empiric Zosyn.  Melena Patient had 2 episodes of black stool,  Will give IV Protonix.  GI will follow-up as outpatient. Will hold Eliquis   Hypertensive urgency Restarted oral medications.  Add Norvasc 2.5 mg  End-stage renal disease on hemodialysis ([place]) Dialysis as per nephrology  Hyperkalemia Improved with dialysis  Anemia in ESRD (end-stage renal disease) (HCC) Hemoglobin stable  Chronic combined systolic and diastolic CHF (congestive heart failure) (HCC) Clinically euvolemic Continue Imdur and metoprolol.  Atrial fibrillation ([place]) Holding Eliquis due to melena stool.  Continue metoprolol  CAD S/P CABG x 4 Restart metoprolol  Emphysema lung ([place]) DuoNebs as needed        Subjective: Patient had some lower abdominal pain and found to have a fluid collection.  Interventional radiology to do a drain procedure today.   Physical Exam: Vitals:   05/05/22 0418 05/05/22 0846 05/05/22 1241 05/05/22 1337  BP: (!) 166/115 (!) 161/106 (!) 146/98 (!) 136/104  Pulse: 77 74 71 68  Resp: '17 18 18 18  '$ Temp: 97.9 F (36.6 C) 97.9 F (36.6 C) 97.8 F (36.6 C) 97.6 F (36.4 C)  TempSrc: Oral   Oral  SpO2: 94% 93% 94% 90%  Weight:      Height:       Physical Exam HENT:     Head: Normocephalic.     Mouth/Throat:     Pharynx: No oropharyngeal exudate.  Eyes:     General: Lids are normal.     Conjunctiva/sclera: Conjunctivae normal.  Cardiovascular:  Rate and Rhythm: Normal rate and regular rhythm.     Heart sounds: Normal heart sounds, S1 normal and S2 normal.  Pulmonary:     Breath sounds: No decreased breath sounds, wheezing, rhonchi or rales.  Abdominal:     Palpations: Abdomen is soft.     Tenderness: There is abdominal tenderness in the right lower quadrant, suprapubic area and left lower quadrant.  Musculoskeletal:     Right lower leg: No swelling.     Left lower leg: No  swelling.  Skin:    General: Skin is warm.     Findings: No rash.  Neurological:     Mental Status: He is alert and oriented to person, place, and time.     Data Reviewed: Potassium 4.9 hepatitis B surface antigen negative  Family Communication: Declined  Disposition: Status is: Inpatient Remains inpatient appropriate because: IR to do a drain procedure today  Planned Discharge Destination: Home    Time spent: 28 minutes  Author: Loletha Grayer, MD 05/05/2022 2:03 PM  For on call review [email or web address].

## 2022-05-05 NOTE — Plan of Care (Signed)

## 2022-05-06 ENCOUNTER — Encounter: Payer: Self-pay | Admitting: Internal Medicine

## 2022-05-06 DIAGNOSIS — T8143XA Infection following a procedure, organ and space surgical site, initial encounter: Secondary | ICD-10-CM | POA: Diagnosis not present

## 2022-05-06 DIAGNOSIS — N186 End stage renal disease: Secondary | ICD-10-CM | POA: Diagnosis not present

## 2022-05-06 DIAGNOSIS — I160 Hypertensive urgency: Secondary | ICD-10-CM | POA: Diagnosis not present

## 2022-05-06 DIAGNOSIS — K921 Melena: Secondary | ICD-10-CM | POA: Diagnosis not present

## 2022-05-06 LAB — BASIC METABOLIC PANEL
Anion gap: 8 (ref 5–15)
BUN: 55 mg/dL — ABNORMAL HIGH (ref 8–23)
CO2: 25 mmol/L (ref 22–32)
Calcium: 7.6 mg/dL — ABNORMAL LOW (ref 8.9–10.3)
Chloride: 104 mmol/L (ref 98–111)
Creatinine, Ser: 8.48 mg/dL — ABNORMAL HIGH (ref 0.61–1.24)
GFR, Estimated: 6 mL/min — ABNORMAL LOW (ref >60)
Glucose, Bld: 74 mg/dL (ref 70–99)
Potassium: 5.2 mmol/L — ABNORMAL HIGH (ref 3.5–5.1)
Sodium: 137 mmol/L (ref 135–145)

## 2022-05-06 LAB — CBC
HCT: 29.7 % — ABNORMAL LOW (ref 39.0–52.0)
Hemoglobin: 8.7 g/dL — ABNORMAL LOW (ref 13.0–17.0)
MCH: 28.7 pg (ref 26.0–34.0)
MCHC: 29.3 g/dL — ABNORMAL LOW (ref 30.0–36.0)
MCV: 98 fL (ref 80.0–100.0)
Platelets: 340 10*3/uL (ref 150–400)
RBC: 3.03 MIL/uL — ABNORMAL LOW (ref 4.22–5.81)
RDW: 16.3 % — ABNORMAL HIGH (ref 11.5–15.5)
WBC: 9.6 10*3/uL (ref 4.0–10.5)
nRBC: 0 % (ref 0.0–0.2)

## 2022-05-06 LAB — HEPATITIS B SURFACE ANTIBODY, QUANTITATIVE: Hep B S AB Quant (Post): 31 m[IU]/mL (ref >9.9)

## 2022-05-06 MED ORDER — OXYCODONE HCL 5 MG PO TABS
10.0000 mg | ORAL_TABLET | Freq: Four times a day (QID) | ORAL | Status: DC | PRN
  Administered 2022-05-06 – 2022-05-07 (×3): 10 mg via ORAL
  Filled 2022-05-06 (×3): qty 2

## 2022-05-06 MED ORDER — HEPARIN SODIUM (PORCINE) 1000 UNIT/ML IJ SOLN
INTRAMUSCULAR | Status: AC
  Filled 2022-05-06: qty 10

## 2022-05-06 MED ORDER — OXYCODONE HCL 5 MG PO TABS: 5.0000 mg | ORAL_TABLET | Freq: Four times a day (QID) | ORAL | Status: DC | PRN

## 2022-05-06 NOTE — Progress Notes (Signed)
[facility]  ROUNDING NOTE   Subjective:   Anthony Hess is a 67 year old male with past medical conditions including hypertension, PVD, dyslipidemia, CAD, four-vessel CABG, and end-stage renal disease on hemodialysis.  Patient presents to the emergency department with complaints of abdominal pain and has been admitted for Melena [K92.1] Hyperkalemia [E87.5] Right upper quadrant abdominal pain [R10.11] Postprocedural intraabdominal abscess [T81.43XA]  Patient is known to our practice from previous admissions and currently receives outpatient dialysis treatments at [facility] on a TTS schedule, supervised by Dr. Holley Raring.    Patient seen and evaluated during dialysis   HEMODIALYSIS FLOWSHEET:  Blood Flow Rate (mL/min): 350 mL/min Arterial Pressure (mmHg): -160 mmHg Venous Pressure (mmHg): 130 mmHg TMP (mmHg): 7 mmHg Ultrafiltration Rate (mL/min): 951 mL/min Dialysate Flow Rate (mL/min): 300 ml/min Dialysis Fluid Bolus: Normal Saline (Pt recieved 100cc bolus due to low bp. Adminstered by LPN. Pt is asymptomatic.) Bolus Amount (mL): 100 mL  Tolerating treatment well. Patient given antihypertensives prior to dialysis treatment, monitoring blood pressure.   Objective:  Vital signs in last 24 hours:  Temp:  [97.6 F (36.4 C)-98.7 F (37.1 C)] 98.7 F (37.1 C) (03/14 0826) Pulse Rate:  [59-83] 68 (03/14 1130) Resp:  [10-36] 14 (03/14 1130) BP: (81-216)/(54-122) 109/76 (03/14 1130) SpO2:  [90 %-97 %] 95 % (03/14 1130) Weight:  [54.4 kg] 54.4 kg (03/14 0823)  Weight change:  Filed Weights   05/04/22 1000 05/04/22 1349 05/06/22 0823  Weight: 57.1 kg 53.1 kg 54.4 kg    Intake/Output: I/O last 3 completed shifts: In: 750.2 [P.O.:600; IV Piggyback:150.2] Out: -    Intake/Output this shift:  No intake/output data recorded.  Physical Exam: General: NAD  Head: Normocephalic, atraumatic. Moist oral mucosal membranes  Eyes: Anicteric  Lungs:  Clear to  auscultation, normal effort  Heart: Regular rate and rhythm  Abdomen:  Soft, tender, midline incision  Extremities: No peripheral edema.  Neurologic: Alert and oriented,  moving all four extremities  Skin: No lesions  Access: Right PermCath    Basic Metabolic Panel: Recent Labs  Lab 04/30/22 0908 05/01/22 0611 05/03/22 2234 05/04/22 0507 05/05/22 0535 05/06/22 0827  NA 135 141 137  --   --  137  K 4.2 4.3 6.1* 5.5* 4.9 5.2*  CL 95* 101 101  --   --  104  CO2 '24 25 24  '$ --   --  25  GLUCOSE 92 97 87  --   --  74  BUN 44* 58* 69*  --   --  55*  CREATININE 7.08* 9.50* 9.97*  --   --  8.48*  CALCIUM 7.8* 7.7* 8.5*  --   --  7.6*  MG 2.1 2.4  --   --   --   --   PHOS 7.3* 8.9*  --   --   --   --      Liver Function Tests: Recent Labs  Lab 04/30/22 0908 05/03/22 2234  AST 14* 14*  ALT <5 8  ALKPHOS 46 47  BILITOT 0.8 0.6  PROT 6.2* 6.2*  ALBUMIN 2.6* 2.7*    Recent Labs  Lab 05/03/22 2234  LIPASE 57*    No results for input(s): "AMMONIA" in the last 168 hours.  CBC: Recent Labs  Lab 04/30/22 0908 05/01/22 0611 05/03/22 2234 05/04/22 0507 05/06/22 0827  WBC 9.6 8.3 14.5*  --  9.6  NEUTROABS  --   --  11.5*  --   --   HGB 10.2* 9.3*  9.3* 10.2* 8.7*  HCT 32.7* 29.8* 31.6*  --  29.7*  MCV 91.6 92.5 98.1  --  98.0  PLT 324 323 312  --  340     Cardiac Enzymes: No results for input(s): "CKTOTAL", "CKMB", "CKMBINDEX", "TROPONINI" in the last 168 hours.  BNP: Invalid input(s): "POCBNP"  CBG: No results for input(s): "GLUCAP" in the last 168 hours.  Microbiology: Results for orders placed or performed during the hospital encounter of 05/03/22  Culture, blood (routine x 2)     Status: None (Preliminary result)   Collection Time: 05/04/22 12:09 AM   Specimen: BLOOD  Result Value Ref Range Status   Specimen Description BLOOD  RAC  Final   Special Requests   Final    BOTTLES DRAWN AEROBIC AND ANAEROBIC Blood Culture results may not be optimal due to  an inadequate volume of blood received in culture bottles   Culture   Final    NO GROWTH 2 DAYS Performed at [facility], [address] [place], [place] [address]    Report Status PENDING  Incomplete  Culture, blood (routine x 2)     Status: None (Preliminary result)   Collection Time: 05/04/22 12:10 AM   Specimen: BLOOD  Result Value Ref Range Status   Specimen Description BLOOD  RIGHT FOREARM  Final   Special Requests   Final    BOTTLES DRAWN AEROBIC AND ANAEROBIC Blood Culture adequate volume   Culture   Final    NO GROWTH 2 DAYS Performed at [facility], [address], [place], [place] [address]    Report Status PENDING  Incomplete  Aerobic/Anaerobic Culture w Gram Stain (surgical/deep wound)     Status: None (Preliminary result)   Collection Time: 05/05/22  4:11 PM   Specimen: Abdomen; Body Fluid  Result Value Ref Range Status   Specimen Description   Final    ABDOMEN Performed at [facility], [address] [place], [place] [address]    Special Requests   Final    NONE Performed at [facility], [place], [place] [address]    Gram Stain NO WBC SEEN NO ORGANISMS SEEN   Final   Culture   Final    NO GROWTH < 12 HOURS Performed at [facility], [place] [address], [place], [place] [address]    Report Status PENDING  Incomplete    Coagulation Studies: No results for input(s): "LABPROT", "INR" in the last 72 hours.   Urinalysis: No results for input(s): "COLORURINE", "LABSPEC", "PHURINE", "GLUCOSEU", "HGBUR", "BILIRUBINUR", "KETONESUR", "PROTEINUR", "UROBILINOGEN", "NITRITE", "LEUKOCYTESUR" in the last 72 hours.  Invalid input(s): "APPERANCEUR"    Imaging: CT ASPIRATION N/S  Result Date: 05/05/2022 INDICATION: 67 year old male with sub a Paddock fluid collection concern for abscess EXAM: CT-GUIDED ASPIRATION OF SUBHEPATIC FLUID MEDICATIONS: The patient is currently admitted to the hospital and  receiving intravenous antibiotics. The antibiotics were administered within an appropriate time frame prior to the initiation of the procedure. ANESTHESIA/SEDATION: Moderate (conscious) sedation was employed during this procedure. A total of Versed 1.0 mg and Fentanyl 50 mcg was administered intravenously by the radiology nurse. Total intra-service moderate Sedation Time: 15 minutes. The patient's level of consciousness and vital signs were monitored continuously by radiology nursing throughout the procedure under my direct supervision. COMPLICATIONS: None PROCEDURE: Informed written consent was obtained from the patient after a thorough discussion of the procedural risks, benefits and alternatives. All questions were addressed. Maximal Sterile Barrier Technique was utilized  including caps, mask, sterile gowns, sterile gloves, sterile drape, hand hygiene and skin antiseptic. A timeout was performed prior to the initiation of the procedure. Patient was positioned left decubitus on the CT gantry table. Scout CT acquired for planning purposes. The patient was then prepped and draped in the usual sterile fashion. 1% lidocaine was used for local anesthesia. A trocar needle was advanced into the sub a Paddock fluid collection with CT guidance. Once we confirmed needle tip position we attempted aspiration. No significant fluid came through the needle. Thus we used modified Seldinger technique to place a 10 [place] drain into the fluid collection. We then aspirated proximally 10 cc of thin serosanguineous fluid. Given that this was not frank purulent fluid we removed the drain. Final CT was acquired. Culture was sent for analysis. Patient tolerated the procedure well and remained hemodynamically stable throughout. No complications were encountered and no significant blood loss. IMPRESSION: Status post CT-guided aspiration of fluid in the subhepatic space. Sample was sent for culture. Signed, Dulcy Fanny. Nadene Rubins, RPVI  [facility] Electronically Signed   By: Corrie Mckusick D.O.   On: 05/05/2022 16:29     Medications:    piperacillin-tazobactam (ZOSYN)  IV 2.25 g (05/06/22 0557)     heparin sodium (porcine)       amLODipine  2.5 mg Oral Daily   calcitRIOL  0.5 mcg Oral Daily   calcium acetate  667 mg Oral TID WC   Chlorhexidine Gluconate Cloth  6 each Topical Q0600   hydrALAZINE  25 mg Oral TID   isosorbide dinitrate  10 mg Oral TID   metoprolol succinate  25 mg Oral Daily   multivitamin  1 tablet Oral QHS   pantoprazole (PROTONIX) IV  40 mg Intravenous Q24H   heparin sodium (porcine), acetaminophen **OR** acetaminophen, morphine injection, ondansetron **OR** ondansetron (ZOFRAN) IV  Assessment/ Plan:  Anthony Hess is a 67 y.o.  male  Anthony Hess is a 67 year old male with past medical conditions including hypertension, PVD, dyslipidemia, CAD, four-vessel CABG, and end-stage renal disease on hemodialysis.  Patient presents to the emergency department with complaints of shortness of breath and has been admitted for Melena [K92.1] Hyperkalemia [E87.5] Right upper quadrant abdominal pain [R10.11] Postprocedural intraabdominal abscess [T81.43XA]  [facility]/TTS/right PermCath.  End-stage renal disease with hyperkalemia on hemodialysis.    Potassium corrected.  Patient receiving scheduled dialysis, UF goal 1 to 1.5 L.  UF off multiple times during treatment due to hypotension, patient given hydralazine and isosorbide prior to dialysis treatment.  Next treatment scheduled for Thursday.   2. Anemia of chronic kidney disease Lab Results  Component Value Date   HGB 8.7 (L) 05/06/2022    Patient receives Mircera at outpatient clinic.  Patient reports dark stools.  GI will evaluate for EGD outpatient.  Hemoglobin just below desired target however stable.  3. Secondary Hyperparathyroidism: with outpatient labs: PTH 532, phosphorus  10.7, calcium 6.1 on 02/11/22.  :  Lab Results  Component Value Date   PTH 159 (H) 12/27/2019   CALCIUM 7.6 (L) 05/06/2022   CAION 0.84 (LL) 09/11/2021   PHOS 8.9 (H) 05/01/2022    Hyperphosphatemia/hypocalcemia noted.  Patient prescribed calcium acetate and calcitriol outpatient.  Continue calcium acetate with meals and calcitriol daily.   4.  Hypertension with chronic kidney disease.  Home regimen includes amlodipine, clonidine, metoprolol, Entresto and losartan.  Currently receiving hydralazine, isosorbide, metoprolol, and Entresto.  Blood pressure 111/83 during dialysis  5.  Abscess, hepatic lobe seen on CT abdomen pelvis.  Surgery feels medical management is sufficient.  Continue antibiotics per primary team.   LOS: 2 [place] 3/14/202412:09 PM

## 2022-05-06 NOTE — Progress Notes (Signed)
PT Cancellation Note  Patient Details Name: Dorothy Stanard MRN: [id number] DOB: 03/19/1955   Cancelled Treatment:    Reason Eval/Treat Not Completed: Patient at procedure or test/unavailable (PT to follow up as appropriate)  Minna Merritts, PT, MPT  Percell Locus 05/06/2022, 9:10 AM

## 2022-05-06 NOTE — Progress Notes (Signed)
Received patient in bed to unit.  Alert and oriented.  Informed consent signed and in chl.  TX duration: 3.5  Patient tolerated well. Unable to reach 1.73m goal due to low bp Transported back to the room  Alert, without acute distress.  Hand-off given to patient's nurse.   Access used: cvc Access issues: none  Total UF removed: 900 ml Medication(s) given: none  Post HD VS: stable Post HD weight: 54 kg   [facility]

## 2022-05-06 NOTE — Progress Notes (Signed)
Mobility Specialist - Progress Note   05/06/22 0847  Mobility  Activity Off unit   Pt off unit during attempt. Will re attempt at a later time.   Gretchen Short  Mobility Specialist  05/06/22 8:47 AM

## 2022-05-06 NOTE — Discharge Planning (Addendum)
[facility] [address], [place] [address] [phone]  Scheduled days: Tuesday Thursday and Saturday  Treatment time: 6:00am  Spoke with patient today, who stated he felt he may not have a safe environment to discharge to. Informed patient I would inform TOC and SW at clinic.

## 2022-05-06 NOTE — Progress Notes (Signed)
Progress Note   Patient: Anthony Hess [id number] DOB: 22-Aug-1955 DOA: 05/03/2022     2 DOS: the patient was seen and examined on 05/06/2022   Brief hospital course: Taken from H&P.  Dariell Gemmel is a 67 y.o. male with medical history significant for HTN, HLD, CAD s/p CABG,  chronic systolic CHF, paroxysmal A-fib and PE (04/19/22) on Eliquis, PVD, ESRD on HD TTS schedule, infrarenal abdominal aortic aneurysm without rupture, hospitalized from  3/4 to 05/01/22 with duodenal perforation, repaired on 04/26/2022, who returns to the ED with a complaint of right upper quadrant pain, nausea as well as 2 episodes of black stool.  He denies vomiting, fever or chills.  ED course and data reviewed: BP 176/111 with otherwise normal vitals.  Labs notable for WBC of 14.5.  Lactic acid 0.7.  Hemoglobin at baseline at 9.7.  Lipase 57, with normal LFTs.  Potassium 6.1. EKG, personally viewed and interpreted showing NSR at 79 with incomplete right bundle branch block and no ischemic ST-T wave changes. CT abdomen and pelvis shows a small abscess right hepatic lobe. Finding does appear to be contiguous with the gallbladder fossa with however no increased free fluid or gas to suggest a biliary leak.  Also noted to have persistent left lower lobe subsegmental PE.  Patient was given calcium gluconate for hyperkalemia and also started on Zosyn. Surgery was consulted.  Percutaneous drain placement by IR ordered.  Home Eliquis was held.  3/12: Blood pressure elevated at 168/111.  Potassium improved to 5.5.  Nephrology was consulted for routine dialysis which will be done today.  Hemoglobin seems stable at 10.2.  Preliminary blood cultures negative in 12 hours.   Assessment and Plan: * Postprocedural intraabdominal abscess S/p laparotomy for for perforated duodenal perforation on 3/4 Patient presents with abdominal pain and had 2 melanotic stools CT abdomen showing right hepatic lobe fluid collection. IR did a  procedure to collect some fluid from that fluid collection.  So far no organisms seen and no white blood cells seen.  Continue Zosyn today.  Will switch over to Augmentin upon discharge.  Pain control.  Melena Patient had 2 episodes of black stool,  Will give IV Protonix.  GI will follow-up as outpatient.  Hemoglobin dipped down to 8.7 today.  Will recheck tomorrow before deciding on restarting Eliquis or not.   Hypertensive urgency Restarted oral medications.  Continue Norvasc 2.5 mg which was added yesterday.  End-stage renal disease on hemodialysis ([place]) Dialysis as per nephrology  Hyperkalemia Improved with dialysis  Anemia in ESRD (end-stage renal disease) ([place]) Hemoglobin dipped to 8.7 but this was before dialysis.  Recheck tomorrow.  Chronic combined systolic and diastolic CHF (congestive heart failure) (HCC) Clinically euvolemic Continue Imdur and metoprolol.  Atrial fibrillation (HCC) Paroxysmal in nature.  Holding Eliquis due to melena stool.  Continue metoprolol  CAD S/P CABG x 4 Restart metoprolol  Emphysema lung ([place]) DuoNebs as needed        Subjective: Patient seen down at dialysis.  Still having 8 out of 10 abdominal pain.  Had interventional radiology procedure with drainage of fluid collection in the abdomen yesterday.  Physical Exam: Vitals:   05/06/22 1130 05/06/22 1205 05/06/22 1213 05/06/22 1445  BP: 109/76 111/83  (!) 149/97  Pulse: 68 65  64  Resp: '14 12  18  '$ Temp:  97.8 F (36.6 C)  98.4 F (36.9 C)  TempSrc:  Oral  Oral  SpO2: 95% 94%  91%  Weight:   54 kg  Height:       Physical Exam HENT:     Head: Normocephalic.     Mouth/Throat:     Pharynx: No oropharyngeal exudate.  Eyes:     General: Lids are normal.     Conjunctiva/sclera: Conjunctivae normal.  Cardiovascular:     Rate and Rhythm: Normal rate and regular rhythm.     Heart sounds: Normal heart sounds, S1 normal and S2 normal.  Pulmonary:     Breath sounds: No decreased  breath sounds, wheezing, rhonchi or rales.  Abdominal:     Palpations: Abdomen is soft.     Tenderness: There is abdominal tenderness in the right lower quadrant, suprapubic area and left lower quadrant.  Musculoskeletal:     Right lower leg: No swelling.     Left lower leg: No swelling.  Skin:    General: Skin is warm.     Findings: No rash.  Neurological:     Mental Status: He is alert and oriented to person, place, and time.     Data Reviewed: Potassium 5.2, creatinine 8.48, hemoglobin 8.7   Disposition: Status is: Inpatient Remains inpatient appropriate because: Patient still an 8 out of 10 abdominal pain today.  Will check hemoglobin again tomorrow before deciding on restarting blood thinner  Planned Discharge Destination: Home    Time spent: 28 minutes  Author: Loletha Grayer, MD 05/06/2022 4:13 PM  For on call review [email or web address].

## 2022-05-06 NOTE — Progress Notes (Signed)
PT Cancellation Note  Patient Details Name: Anthony Hess MRN: [id number] DOB: 05/22/1955   Cancelled Treatment:    Reason Eval/Treat Not Completed: Other (comment). Met with patient at bedside. Pt reports he is indep with all ambulation, no DME use at home and lives with family support. Reports no falls. Documented as walking indep per mobility flowsheet. Educated on continued benefit of mobilizing during hospital stay. Will dc in house at this time. Please re-order if needs change.   Anthony Hess 05/06/2022, 4:01 PM Greggory Stallion, PT, DPT, GCS [phone]

## 2022-05-07 DIAGNOSIS — I160 Hypertensive urgency: Secondary | ICD-10-CM | POA: Diagnosis not present

## 2022-05-07 DIAGNOSIS — T8143XA Infection following a procedure, organ and space surgical site, initial encounter: Secondary | ICD-10-CM | POA: Diagnosis not present

## 2022-05-07 DIAGNOSIS — N186 End stage renal disease: Secondary | ICD-10-CM | POA: Diagnosis not present

## 2022-05-07 DIAGNOSIS — K921 Melena: Secondary | ICD-10-CM | POA: Diagnosis not present

## 2022-05-07 LAB — HEMOGLOBIN: Hemoglobin: 8.9 g/dL — ABNORMAL LOW (ref 13.0–17.0)

## 2022-05-07 MED ORDER — AMOXICILLIN-POT CLAVULANATE 500-125 MG PO TABS: 1.0000 | ORAL_TABLET | Freq: Every evening | ORAL | 0 refills | Status: AC

## 2022-05-07 MED ORDER — PANTOPRAZOLE SODIUM 40 MG PO TBEC: 40.0000 mg | DELAYED_RELEASE_TABLET | Freq: Every day | ORAL | 0 refills | Status: DC

## 2022-05-07 MED ORDER — AMOXICILLIN-POT CLAVULANATE 500-125 MG PO TABS: 1.0000 | ORAL_TABLET | Freq: Every evening | ORAL | Status: DC

## 2022-05-07 MED ORDER — OXYCODONE HCL 5 MG PO TABS: 5.0000 mg | ORAL_TABLET | Freq: Four times a day (QID) | ORAL | 0 refills | Status: AC | PRN

## 2022-05-07 MED ORDER — AMLODIPINE BESYLATE 2.5 MG PO TABS: 2.5000 mg | ORAL_TABLET | Freq: Every day | ORAL | 0 refills | Status: DC

## 2022-05-07 NOTE — Progress Notes (Signed)
[facility]  ROUNDING NOTE   Subjective:   Anthony Hess is a 67 year old male with past medical conditions including hypertension, PVD, dyslipidemia, CAD, four-vessel CABG, and end-stage renal disease on hemodialysis.  Patient presents to the emergency department with complaints of abdominal pain and has been admitted for Melena [K92.1] Hyperkalemia [E87.5] Right upper quadrant abdominal pain [R10.11] Postprocedural intraabdominal abscess [T81.43XA]  Patient is known to our practice from previous admissions and currently receives outpatient dialysis treatments at [facility] on a TTS schedule, supervised by Dr. Holley Raring.    Patient seen resting in bed Partially dressed, signing AVS States he feels well, ready for discharge.  No lower extremity edema Denies pain   Objective:  Vital signs in last 24 hours:  Temp:  [97.6 F (36.4 C)-98.4 F (36.9 C)] 98.3 F (36.8 C) (03/15 0724) Pulse Rate:  [59-76] 76 (03/15 1006) Resp:  [13-18] 13 (03/15 0724) BP: (100-153)/(66-101) 101/72 (03/15 1006) SpO2:  [90 %-93 %] 93 % (03/15 0724) Weight:  [54 kg] 54 kg (03/14 1213)  Weight change:  Filed Weights   05/04/22 1349 05/06/22 0823 05/06/22 1213  Weight: 53.1 kg 54.4 kg 54 kg    Intake/Output: I/O last 3 completed shifts: In: 300 [P.O.:300] Out: 900 [Other:900]   Intake/Output this shift:  No intake/output data recorded.  Physical Exam: General: NAD  Head: Normocephalic, atraumatic. Moist oral mucosal membranes  Eyes: Anicteric  Lungs:  Clear to auscultation, normal effort  Heart: Regular rate and rhythm  Abdomen:  Soft, tender, midline incision  Extremities: No peripheral edema.  Neurologic: Alert and oriented,  moving all four extremities  Skin: No lesions  Access: Right PermCath    Basic Metabolic Panel: Recent Labs  Lab 05/01/22 0611 05/03/22 2234 05/04/22 0507 05/05/22 0535 05/06/22 0827  NA 141 137  --   --  137  K 4.3 6.1* 5.5* 4.9 5.2*  CL  101 101  --   --  104  CO2 25 24  --   --  25  GLUCOSE 97 87  --   --  74  BUN 58* 69*  --   --  55*  CREATININE 9.50* 9.97*  --   --  8.48*  CALCIUM 7.7* 8.5*  --   --  7.6*  MG 2.4  --   --   --   --   PHOS 8.9*  --   --   --   --      Liver Function Tests: Recent Labs  Lab 05/03/22 2234  AST 14*  ALT 8  ALKPHOS 47  BILITOT 0.6  PROT 6.2*  ALBUMIN 2.7*    Recent Labs  Lab 05/03/22 2234  LIPASE 57*    No results for input(s): "AMMONIA" in the last 168 hours.  CBC: Recent Labs  Lab 05/01/22 0611 05/03/22 2234 05/04/22 0507 05/06/22 0827 05/07/22 0552  WBC 8.3 14.5*  --  9.6  --   NEUTROABS  --  11.5*  --   --   --   HGB 9.3* 9.3* 10.2* 8.7* 8.9*  HCT 29.8* 31.6*  --  29.7*  --   MCV 92.5 98.1  --  98.0  --   PLT 323 312  --  340  --      Cardiac Enzymes: No results for input(s): "CKTOTAL", "CKMB", "CKMBINDEX", "TROPONINI" in the last 168 hours.  BNP: Invalid input(s): "POCBNP"  CBG: No results for input(s): "GLUCAP" in the last 168 hours.  Microbiology: Results for orders  placed or performed during the hospital encounter of 05/03/22  Culture, blood (routine x 2)     Status: None (Preliminary result)   Collection Time: 05/04/22 12:09 AM   Specimen: BLOOD  Result Value Ref Range Status   Specimen Description BLOOD  RAC  Final   Special Requests   Final    BOTTLES DRAWN AEROBIC AND ANAEROBIC Blood Culture results may not be optimal due to an inadequate volume of blood received in culture bottles   Culture   Final    NO GROWTH 3 DAYS Performed at [facility], [address] [place], [place] [address]    Report Status PENDING  Incomplete  Culture, blood (routine x 2)     Status: None (Preliminary result)   Collection Time: 05/04/22 12:10 AM   Specimen: BLOOD  Result Value Ref Range Status   Specimen Description BLOOD  RIGHT FOREARM  Final   Special Requests   Final    BOTTLES DRAWN AEROBIC AND ANAEROBIC Blood Culture adequate volume    Culture   Final    NO GROWTH 3 DAYS Performed at [facility], [address] [place], [place] [address]    Report Status PENDING  Incomplete  Aerobic/Anaerobic Culture w Gram Stain (surgical/deep wound)     Status: None (Preliminary result)   Collection Time: 05/05/22  4:11 PM   Specimen: Abdomen; Body Fluid  Result Value Ref Range Status   Specimen Description   Final    ABDOMEN Performed at [facility], [address] [place], [place] [address]    Special Requests   Final    NONE Performed at [facility], [address] [place], [place] [address]    Gram Stain   Final    NO WBC SEEN NO ORGANISMS SEEN Performed at [facility], [place] [address] [place], [place] [address]    Culture   Final    CULTURE REINCUBATED FOR BETTER GROWTH NO ANAEROBES ISOLATED; CULTURE IN PROGRESS FOR 5 DAYS   Report Status PENDING  Incomplete    Coagulation Studies: No results for input(s): "LABPROT", "INR" in the last 72 hours.   Urinalysis: No results for input(s): "COLORURINE", "LABSPEC", "PHURINE", "GLUCOSEU", "HGBUR", "BILIRUBINUR", "KETONESUR", "PROTEINUR", "UROBILINOGEN", "NITRITE", "LEUKOCYTESUR" in the last 72 hours.  Invalid input(s): "APPERANCEUR"    Imaging: CT ASPIRATION N/S  Result Date: 05/05/2022 INDICATION: 67 year old male with sub a Paddock fluid collection concern for abscess EXAM: CT-GUIDED ASPIRATION OF SUBHEPATIC FLUID MEDICATIONS: The patient is currently admitted to the hospital and receiving intravenous antibiotics. The antibiotics were administered within an appropriate time frame prior to the initiation of the procedure. ANESTHESIA/SEDATION: Moderate (conscious) sedation was employed during this procedure. A total of Versed 1.0 mg and Fentanyl 50 mcg was administered intravenously by the radiology nurse. Total intra-service moderate Sedation Time: 15 minutes. The patient's level of consciousness and vital signs were monitored  continuously by radiology nursing throughout the procedure under my direct supervision. COMPLICATIONS: None PROCEDURE: Informed written consent was obtained from the patient after a thorough discussion of the procedural risks, benefits and alternatives. All questions were addressed. Maximal Sterile Barrier Technique was utilized including caps, mask, sterile gowns, sterile gloves, sterile drape, hand hygiene and skin antiseptic. A timeout was performed prior to the initiation of the procedure. Patient was positioned left decubitus on the CT gantry table. Scout CT acquired for planning purposes. The patient was then prepped and draped in the usual sterile fashion. 1% lidocaine was used for local  anesthesia. A trocar needle was advanced into the sub a Paddock fluid collection with CT guidance. Once we confirmed needle tip position we attempted aspiration. No significant fluid came through the needle. Thus we used modified Seldinger technique to place a 10 [place] drain into the fluid collection. We then aspirated proximally 10 cc of thin serosanguineous fluid. Given that this was not frank purulent fluid we removed the drain. Final CT was acquired. Culture was sent for analysis. Patient tolerated the procedure well and remained hemodynamically stable throughout. No complications were encountered and no significant blood loss. IMPRESSION: Status post CT-guided aspiration of fluid in the subhepatic space. Sample was sent for culture. Signed, Dulcy Fanny. Nadene Rubins, RPVI [facility] Electronically Signed   By: Corrie Mckusick D.O.   On: 05/05/2022 16:29     Medications:       amLODipine  2.5 mg Oral Daily   amoxicillin-clavulanate  1 tablet Oral QPM   calcitRIOL  0.5 mcg Oral Daily   calcium acetate  667 mg Oral TID WC   Chlorhexidine Gluconate Cloth  6 each Topical Q0600   hydrALAZINE  25 mg Oral TID   isosorbide dinitrate  10 mg Oral TID    metoprolol succinate  25 mg Oral Daily   multivitamin  1 tablet Oral QHS   pantoprazole (PROTONIX) IV  40 mg Intravenous Q24H   acetaminophen **OR** acetaminophen, ondansetron **OR** ondansetron (ZOFRAN) IV, oxyCODONE, oxyCODONE  Assessment/ Plan:  Mr. Nagi Lisonbee is a 67 y.o.  male  Kashad Harney is a 67 year old male with past medical conditions including hypertension, PVD, dyslipidemia, CAD, four-vessel CABG, and end-stage renal disease on hemodialysis.  Patient presents to the emergency department with complaints of shortness of breath and has been admitted for Melena [K92.1] Hyperkalemia [E87.5] Right upper quadrant abdominal pain [R10.11] Postprocedural intraabdominal abscess [T81.43XA]  [facility]/TTS/right PermCath.  End-stage renal disease with hyperkalemia on hemodialysis.    Potassium elevated yesterday however addressed during dialysis with 2K bath. Next treatment scheduled for Saturday.   2. Anemia of chronic kidney disease Lab Results  Component Value Date   HGB 8.9 (L) 05/07/2022    Patient receives Mircera at outpatient clinic.  GI will evaluate dark stools with EGD outpatient.    3. Secondary Hyperparathyroidism: with outpatient labs: PTH 532, phosphorus 10.7, calcium 6.1 on 02/11/22.  :  Lab Results  Component Value Date   PTH 159 (H) 12/27/2019   CALCIUM 7.6 (L) 05/06/2022   CAION 0.84 (LL) 09/11/2021   PHOS 8.9 (H) 05/01/2022    Hyperphosphatemia/hypocalcemia noted. Continue calcium acetate with meals and calcitriol daily.   4.  Hypertension with chronic kidney disease.  Home regimen includes amlodipine, clonidine, metoprolol, Entresto and losartan.  Currently receiving hydralazine, isosorbide, metoprolol, and Entresto.  Blood pressure stable  5.  Abscess, hepatic lobe seen on CT abdomen pelvis.  Surgery feels medical management is sufficient.  Continue oral antibiotics per primary team.   LOS: 3 [place] 3/15/202412:07 PM

## 2022-05-07 NOTE — TOC Transition Note (Signed)
Transition of Care [facility]) - CM/SW Discharge Note   Patient Details  Name: Anthony Hess MRN: [id number] Date of Birth: January 07, 1956  [facility]) CM/SW Contact:  Gerilyn Pilgrim, LCSW Phone Number: 05/07/2022, 10:38 AM   Clinical Narrative:   Pt discharging back home. Pt has referral to follow up with gastroenterology, Andrey Farmer and PCP follow up at [facility] family practice with Dr Rosita FireQuentin Cornwall, appt for May 3rd. Pt requesting taxi voucher to discharge home.           Patient Goals and CMS Choice      Discharge Placement                         Discharge Plan and Services Additional resources added to the After Visit Summary for                                       Social Determinants of Health (SDOH) Interventions SDOH Screenings   Food Insecurity: No Food Insecurity (05/06/2022)  Housing: Low Risk  (05/06/2022)  Transportation Needs: No Transportation Needs (05/06/2022)  Utilities: Not At Risk (05/06/2022)  Depression (PHQ2-9): Low Risk  (01/11/2020)  Tobacco Use: Medium Risk (05/06/2022)     Readmission Risk Interventions    04/28/2022    2:26 PM 11/18/2021    2:04 PM 04/09/2021    1:32 PM  Readmission Risk Prevention Plan  Transportation Screening Complete Complete Complete  Palliative Care Screening   Not Applicable  Medication Review ([place]) Complete Complete Complete  PCP or Specialist appointment within 3-5 days of discharge Complete Complete   HRI or [place] Complete    SW Recovery Care/Counseling Consult Complete Complete   Palliative Care Screening Not Applicable Not [place] Not Applicable Not Applicable

## 2022-05-07 NOTE — Discharge Summary (Signed)
Physician Discharge Summary   Patient: Anthony Hess MRN: [id number] DOB: [birth year]  Admit date:     05/03/2022  Discharge date: 05/07/22  Discharge Physician: Loletha Grayer   PCP: Patient, No Pcp Per   Recommendations at discharge:   Follow-up PCP 1 week Follow-up gastroenterology Follow-up general surgery Follow-up with dialysis  Discharge Diagnoses: Principal Problem:   Postprocedural intraabdominal abscess Active Problems:   S/P exploratory laparotomy for perforated duodenum with admission 04/26/22   Melena   Hypertensive urgency   Emphysema lung (HCC)   CAD S/P CABG x 4   Atrial fibrillation (HCC)   Chronic combined systolic and diastolic CHF (congestive heart failure) (HCC)   Anemia in ESRD (end-stage renal disease) ([place])   Hyperkalemia   End-stage renal disease on hemodialysis [facility])   Hospital Course: Taken from H&P.  Anthony Hess is a 67 y.o. male with medical history significant for HTN, HLD, CAD s/p CABG,  chronic systolic CHF, paroxysmal A-fib and PE (04/19/22) on Eliquis, PVD, ESRD on HD TTS schedule, infrarenal abdominal aortic aneurysm without rupture, hospitalized from  3/4 to 05/01/22 with duodenal perforation, repaired on 04/26/2022, who returns to the ED with a complaint of right upper quadrant pain, nausea as well as 2 episodes of black stool.  He denies vomiting, fever or chills.  ED course and data reviewed: BP 176/111 with otherwise normal vitals.  Labs notable for WBC of 14.5.  Lactic acid 0.7.  Hemoglobin at baseline at 9.7.  Lipase 57, with normal LFTs.  Potassium 6.1. EKG, personally viewed and interpreted showing NSR at 79 with incomplete right bundle branch block and no ischemic ST-T wave changes. CT abdomen and pelvis shows a small abscess right hepatic lobe. Finding does appear to be contiguous with the gallbladder fossa with however no increased free fluid or gas to suggest a biliary leak.  Also noted to have persistent left lower lobe subsegmental  PE.  Patient was given calcium gluconate for hyperkalemia and also started on Zosyn. Surgery was consulted.  Percutaneous drain placement by IR ordered.  Home Eliquis was held.  3/12: Blood pressure elevated at 168/111.  Potassium improved to 5.5.  Nephrology was consulted for routine dialysis which will be done today.  Hemoglobin seems stable at 10.2.  Preliminary blood cultures negative in 12 hours. 3/13.  Interventional radiology procedure for fluid collection drainage. 3/14.  Patient having abdominal pain 8 out of 10 in intensity 3/15.  Patient feeling better.  Patient will go back on Eliquis as outpatient.  Small prescription of pain pills for few days upon going home.  Staples removed by general surgery.    Assessment and Plan: * Postprocedural intraabdominal abscess S/p laparotomy for for perforated duodenal perforation on 3/4 Patient presents with abdominal pain and had 2 melanotic stools CT abdomen showing right hepatic lobe fluid collection. IR did a procedure to collect some fluid from that fluid collection.  So far no organisms seen and no white blood cells seen.  Changed to Augmentin upon going home.  General surgery will follow-up on the cultures for the next few days and adjust antibiotics if needed but hopefully this is just a fluid collection and not an abscess.  Melena Patient had 2 episodes of black stool, but no bowel movements here. Will restart Eliquis as outpatient.  Last hemoglobin 8.9.  Recommend checking a CBC and follow-up appointment.   Hypertensive urgency Restarted oral medications.  Continue Norvasc 2.5 mg daily which was added here in the hospital.  End-stage renal disease  on hemodialysis [facility]) Dialysis as per nephrology  Hyperkalemia Improved with dialysis  Anemia in ESRD (end-stage renal disease) ([place]) Hemoglobin 8.9 on discharge.  Patient had no further bowel movements here in the hospital.  I do not think he is actively bleeding.  Chronic  combined systolic and diastolic CHF (congestive heart failure) (HCC) Clinically euvolemic Continue Imdur and metoprolol.  Atrial fibrillation (HCC) Paroxysmal in nature.  Can go back on Eliquis since the patient has not had any bowel movements here.  Continue metoprolol  CAD S/P CABG x 4 Continue metoprolol  Emphysema lung (HCC) DuoNebs as needed         Consultants: Interventional radiology, general surgery, nephrology, gastroenterology Procedures performed: Interventional radiology fluid collection drainage Disposition: Home Diet recommendation:  Renal diet DISCHARGE MEDICATION: Allergies as of 05/07/2022   No Known Allergies      Medication List     TAKE these medications    acetaminophen 500 MG tablet Commonly known as: TYLENOL Take 2 tablets (1,000 mg total) by mouth every 6 (six) hours as needed for mild pain, fever or headache (or Fever >/= 101).   amLODipine 2.5 MG tablet Commonly known as: NORVASC Take 1 tablet (2.5 mg total) by mouth daily.   amoxicillin-clavulanate 500-125 MG tablet Commonly known as: AUGMENTIN Take 1 tablet by mouth every evening for 7 days. What changed: when to take this   apixaban 5 MG Tabs tablet Commonly known as: ELIQUIS Take 1 tablet (5 mg total) by mouth 2 (two) times daily.   atorvastatin 80 MG tablet Commonly known as: LIPITOR Take 1 tablet (80 mg total) by mouth daily.   calcitRIOL 0.5 MCG capsule Commonly known as: ROCALTROL Take 1 capsule (0.5 mcg total) by mouth daily.   calcium acetate 667 MG capsule Commonly known as: PHOSLO Take 1 capsule (667 mg total) by mouth 3 (three) times daily with meals.   hydrALAZINE 25 MG tablet Commonly known as: APRESOLINE Take 1 tablet (25 mg total) by mouth 3 (three) times daily.   isosorbide dinitrate 10 MG tablet Commonly known as: ISORDIL Take 1 tablet (10 mg total) by mouth 3 (three) times daily.   metoprolol succinate 25 MG 24 hr tablet Commonly known as:  TOPROL-XL Take 25 mg by mouth daily.   multivitamin Tabs tablet Take 1 tablet by mouth at bedtime.   oxyCODONE 5 MG immediate release tablet Commonly known as: Oxy IR/ROXICODONE Take 1 tablet (5 mg total) by mouth every 6 (six) hours as needed for up to 3 days for severe pain.   pantoprazole 40 MG tablet Commonly known as: Protonix Take 1 tablet (40 mg total) by mouth daily. What changed: when to take this   Velphoro 500 MG chewable tablet Generic drug: sucroferric oxyhydroxide Chew 1 tablet (500 mg total) by mouth 3 (three) times daily with meals.   Vitamin D (Ergocalciferol) 1.25 MG (50000 UNIT) Caps capsule Commonly known as: DRISDOL Take 1 capsule (50,000 Units total) by mouth every 7 (seven) days.        Follow-up Information     Lesly Rubenstein, MD Follow up in 2 week(s).   Specialty: Gastroenterology Why: The office will call you for you follow up. Contact information: [place] [address] [phone]         Herbert Pun, MD Follow up on 05/13/2022.   Specialty: General Surgery Why: Go at 9:15am. Contact information: [place] [address] [phone]  your medical doctor Follow up in 1 week(s).   Why: Please call the office to make your follow up.               Discharge Exam: Filed Weights   05/04/22 1349 05/06/22 0823 05/06/22 1213  Weight: 53.1 kg 54.4 kg 54 kg   Physical Exam HENT:     Head: Normocephalic.     Mouth/Throat:     Pharynx: No oropharyngeal exudate.  Eyes:     General: Lids are normal.     Conjunctiva/sclera: Conjunctivae normal.  Cardiovascular:     Rate and Rhythm: Normal rate and regular rhythm.     Heart sounds: Normal heart sounds, S1 normal and S2 normal.  Pulmonary:     Breath sounds: No decreased breath sounds, wheezing, rhonchi or rales.  Abdominal:     Palpations: Abdomen is soft.     Tenderness: There is abdominal tenderness in the right  upper quadrant and right lower quadrant.  Musculoskeletal:     Right lower leg: No swelling.     Left lower leg: No swelling.  Skin:    General: Skin is warm.     Findings: No rash.  Neurological:     Mental Status: He is alert and oriented to person, place, and time.      Condition at discharge: stable  The results of significant diagnostics from this hospitalization (including imaging, microbiology, ancillary and laboratory) are listed below for reference.   Imaging Studies: CT ASPIRATION N/S  Result Date: 05/05/2022 INDICATION: 67 year old male with sub a Paddock fluid collection concern for abscess EXAM: CT-GUIDED ASPIRATION OF SUBHEPATIC FLUID MEDICATIONS: The patient is currently admitted to the hospital and receiving intravenous antibiotics. The antibiotics were administered within an appropriate time frame prior to the initiation of the procedure. ANESTHESIA/SEDATION: Moderate (conscious) sedation was employed during this procedure. A total of Versed 1.0 mg and Fentanyl 50 mcg was administered intravenously by the radiology nurse. Total intra-service moderate Sedation Time: 15 minutes. The patient's level of consciousness and vital signs were monitored continuously by radiology nursing throughout the procedure under my direct supervision. COMPLICATIONS: None PROCEDURE: Informed written consent was obtained from the patient after a thorough discussion of the procedural risks, benefits and alternatives. All questions were addressed. Maximal Sterile Barrier Technique was utilized including caps, mask, sterile gowns, sterile gloves, sterile drape, hand hygiene and skin antiseptic. A timeout was performed prior to the initiation of the procedure. Patient was positioned left decubitus on the CT gantry table. Scout CT acquired for planning purposes. The patient was then prepped and draped in the usual sterile fashion. 1% lidocaine was used for local anesthesia. A trocar needle was advanced into  the sub a Paddock fluid collection with CT guidance. Once we confirmed needle tip position we attempted aspiration. No significant fluid came through the needle. Thus we used modified Seldinger technique to place a 10 [place] drain into the fluid collection. We then aspirated proximally 10 cc of thin serosanguineous fluid. Given that this was not frank purulent fluid we removed the drain. Final CT was acquired. Culture was sent for analysis. Patient tolerated the procedure well and remained hemodynamically stable throughout. No complications were encountered and no significant blood loss. IMPRESSION: Status post CT-guided aspiration of fluid in the subhepatic space. Sample was sent for culture. Signed, Dulcy Fanny. Nadene Rubins, RPVI [facility] Electronically Signed   By: Corrie Mckusick D.O.   On: 05/05/2022 16:29   CT  Abdomen Pelvis W Contrast  Result Date: 05/03/2022 CLINICAL DATA:  Abdominal pain, post-op reporting pain to abdominal incision site that has worsened since getting discharged from hospital 05/01/22. States he had surgery on March 4th for perforated bowel. EXAM: CT ABDOMEN AND PELVIS WITH CONTRAST TECHNIQUE: Multidetector CT imaging of the abdomen and pelvis was performed using the standard protocol following bolus administration of intravenous contrast. RADIATION DOSE REDUCTION: This exam was performed according to the departmental dose-optimization program which includes automated exposure control, adjustment of the mA and/or kV according to patient size and/or use of iterative reconstruction technique. CONTRAST:  36mL OMNIPAQUE IOHEXOL 350 MG/ML SOLN COMPARISON:  CT abdomen pelvis 04/29/2022, CT abdomen pelvis 04/26/2022, CT angio chest and abdomen pelvis 04/19/2022 FINDINGS: Lower chest: Left lower lobe subsegmental pulmonary artery filling defect consistent with pulmonary embolus. Hepatobiliary: 3 cm right hepatic dome hemangioma  (6:49). Couple hepatic cysts again noted. Slightly more organized 3.5 x 1.9 cm organized fluid collection along the posterior inferior right hepatic lobe likely representing an abscess (6:47). Associated trace perihepatic simple free fluid. Redemonstration of adjacent fat density of unclear etiology (6:40)- possibly postsurgical in the setting of duodenal ulcer perforation repair. Associated mild pericholecystic free fluid-not increased from prior. No gallbladder wall thickening. No CT evidence of cholelithiasis. No biliary dilatation. Pancreas: No focal lesion. Normal pancreatic contour. No surrounding inflammatory changes. No main pancreatic ductal dilatation. Spleen: Normal in size without focal abnormality. Adrenals/Urinary Tract: No adrenal nodule bilaterally. Bilateral atrophic kidneys. Bilateral kidneys enhance symmetrically. Subcentimeter hypodensities are too small to characterize. No hydronephrosis. No hydroureter. The urinary bladder is unremarkable. Stomach/Bowel: PO contrast reaches the rectum. Stomach is within normal limits. No evidence of bowel wall thickening or dilatation. Appendix appears normal. Vascular/Lymphatic: No abdominal aorta or iliac aneurysm. Severe atherosclerotic plaque of the aorta and its branches. No abdominal, pelvic, or inguinal lymphadenopathy. Reproductive: Prostate is unremarkable. Other: Stranding of the right pericolic gutter with associated trace fluid. Mild perihepatic simple free fluid. Mild pericholecystic simple free fluid. Interval decrease of small volume scattered free foci of gas within the upper abdomen likely related to postsurgical changes. No organized fluid collection. Musculoskeletal: No abdominal wall hernia or abnormality. No suspicious lytic or blastic osseous lesions. No acute displaced fracture. Levoscoliosis of the thoracolumbar spine. IMPRESSION: 1. Slightly more organized 3.5 x 1.9 cm organized fluid collection along the posterior inferior right  hepatic lobe likely representing an abscess. Persistent grossly stable associated right upper quadrant and pericolic gutter trace free fluid and inflammatory changes. Finding does appear to be contiguous with the gallbladder fossa with however no increased free fluid or gas to suggest a biliary leak. 2. Interval decrease of small volume scattered free foci of gas within the upper abdomen likely related to postsurgical changes. 3. Persistent left lower lobe subsegmental pulmonary embolus. No associated heart strain or visualized pulmonary infarction. Electronically Signed   By: Iven Finn M.D.   On: 05/03/2022 22:51   CT ABDOMEN WO CONTRAST  Result Date: 04/29/2022 CLINICAL DATA:  Status post surgical repair of perforated duodenal ulcer. EXAM: CT ABDOMEN WITHOUT CONTRAST TECHNIQUE: Multidetector CT imaging of the abdomen was performed following the standard protocol without IV contrast. RADIATION DOSE REDUCTION: This exam was performed according to the departmental dose-optimization program which includes automated exposure control, adjustment of the mA and/or kV according to patient size and/or use of iterative reconstruction technique. COMPARISON:  April 26, 2022. FINDINGS: Lower chest: Small bilateral pleural effusions are noted. Right lower lobe subsegmental atelectasis is noted. Hepatobiliary:  Right hepatic cyst is noted. No cholelithiasis or biliary dilatation is noted. Pancreas: Unremarkable. No pancreatic ductal dilatation or surrounding inflammatory changes. Spleen: Normal in size without focal abnormality. Adrenals/Urinary Tract: Adrenal glands appear normal. Bilateral renal atrophy is noted consistent with end-stage renal disease. No hydronephrosis or renal obstruction is noted. Stomach/Bowel: The stomach is unremarkable. Contrast is seen filling the stomach, proximal small bowel as well as the colon. No definite contrast extravasation is noted. Vascular/Lymphatic: Aortic atherosclerosis. No  enlarged abdominal lymph nodes. Other: Surgical drain is seen in right upper quadrant. Mild amount of pneumoperitoneum is noted consistent with recent postoperative status. No definite abnormal fluid collection is noted within the visualized portion of the abdomen. Musculoskeletal: No acute or significant osseous findings. IMPRESSION: Status post surgical repair of perforated duodenal ulcer. No definite contrast extravasation is noted in the visualized portion of the abdomen currently. Surgical drain is noted in right upper quadrant. Mild pneumoperitoneum is noted consistent with postoperative status. Small bilateral pleural effusions are noted. Probable right lower lobe subsegmental atelectasis is noted. Bilateral renal atrophy is noted consistent with end-stage renal disease. Aortic Atherosclerosis (ICD10-I70.0). Electronically Signed   By: Marijo Conception M.D.   On: 04/29/2022 09:01   DG UGI W SINGLE CM (SOL OR THIN BA)  Result Date: 04/28/2022 CLINICAL DATA:  Patient is 2 days status post duodenal ulcer repair. EXAM: DG UGI W SINGLE CM TECHNIQUE: Single contrast examination was then performed using thin liquid barium. This exam was performed by Reatha Armour, PA-C , and was supervised and interpreted by Dr Kathreen Devoid. FLUOROSCOPY: Radiation Exposure Index (as provided by the fluoroscopic device): 10.20 mGy Kerma COMPARISON:  None Available. FINDINGS: Esophagus:  Normal appearance. Esophageal motility:  Within normal limits. Gastroesophageal reflux:  None visualized. Ingested 41mm barium tablet: Not given Stomach: Normal appearance. No hiatal hernia. Gastric emptying: Normal. Duodenum: Normal appearance. No contrast extravasation visualized on today's exam. Other:  None. IMPRESSION: Limited upper GI study conducted secondary to limited mobility and limited ability to tolerate contrast material. No extraluminal contrast to suggest a duodenal leak or perforation. If there is persistent clinical concern,  recommend a CT of the abdomen with oral contrast only. Electronically Signed   By: Kathreen Devoid M.D.   On: 04/28/2022 11:05   CT ABDOMEN PELVIS WO CONTRAST  Result Date: 04/26/2022 CLINICAL DATA:  Right lower quadrant abdominal pain. EXAM: CT ABDOMEN AND PELVIS WITHOUT CONTRAST TECHNIQUE: Multidetector CT imaging of the abdomen and pelvis was performed following the standard protocol without IV contrast. RADIATION DOSE REDUCTION: This exam was performed according to the departmental dose-optimization program which includes automated exposure control, adjustment of the mA and/or kV according to patient size and/or use of iterative reconstruction technique. COMPARISON:  CT abdomen pelvis dated 04/19/2022. FINDINGS: Evaluation of this exam is limited in the absence of intravenous contrast. Evaluation is also limited due to respiratory motion and paucity of intra-abdominal fat. Lower chest: The visualized lung bases are clear. There is pneumoperitoneum and small ascites. Hepatobiliary: A 2 cm hypodense lesion in the dome of the liver not characterized on this CT but was present on the prior CT. Small hypodense lesion in the left lobe of the liver also present on the prior CT. A small cyst noted in the right lobe 1 of the liver inferiorly. No dilatation. There is layering sludge and small stones within the gallbladder. No pericholecystic fluid. Pancreas: The pancreas is grossly unremarkable as visualized. Spleen: Normal in size without focal abnormality. Adrenals/Urinary Tract: The  adrenal glands are unremarkable. Moderate bilateral renal parenchyma atrophy. There is no hydronephrosis or nephrolithiasis on either side. The urinary bladder is minimally distended and grossly unremarkable. Stomach/Bowel: There is no bowel obstruction. The appendix is unremarkable as visualized. Vascular/Lymphatic: Advanced aortoiliac atherosclerotic disease. The aorta is tortuous. The IVC is grossly unremarkable. No obvious adenopathy.  Reproductive: Mildly enlarged prostate gland measuring 4.5 cm in transverse axial diameter. Other: None Musculoskeletal: Degenerative changes of the spine and scoliosis. No acute osseous pathology. IMPRESSION: 1. Pneumoperitoneum and small ascites. Findings are concerning for bowel perforation. Surgical consult is advised. 2. Cholelithiasis. 3. No hydronephrosis or nephrolithiasis. 4. No bowel obstruction. Normal appendix. 5.  Aortic Atherosclerosis (ICD10-I70.0). These results were called by telephone at the time of interpretation on 04/26/2022 at 4:49 pm to provider [facility] , who verbally acknowledged these results. Electronically Signed   By: Anner Crete M.D.   On: 04/26/2022 16:51   DG Chest Portable 1 View  Result Date: 04/26/2022 CLINICAL DATA:  Shortness of breath, abdominal pain EXAM: PORTABLE CHEST 1 VIEW COMPARISON:  Previous studies including the examination of 04/19/2022 FINDINGS: Transverse diameter of heart is slightly increased. Central pulmonary vessels are slightly prominent. Thoracic aorta is tortuous and ectatic. Tip of dialysis catheter is seen at the junction of superior vena cava and right atrium. There is previous coronary bypass surgery. Faint lucency is seen in the right hemidiaphragm. Patient is scheduled for CT abdomen and pelvis. Dextroscoliosis is seen in thoracic spine. IMPRESSION: There are no signs of pulmonary edema or focal pulmonary consolidation. There is no pleural effusion or pneumothorax. Faint lucencies are seen under the right hemidiaphragm suggesting possible pneumoperitoneum. Electronically Signed   By: Elmer Picker M.D.   On: 04/26/2022 16:43   US Venous Img Lower Bilateral (DVT)  Result Date: 04/20/2022 CLINICAL DATA:  History of smoking. History of previous DVT. Evaluate for acute or chronic DVT. EXAM: BILATERAL LOWER EXTREMITY VENOUS DOPPLER ULTRASOUND TECHNIQUE: Gray-scale sonography with graded compression, as well as color Doppler and  duplex ultrasound were performed to evaluate the lower extremity deep venous systems from the level of the common femoral vein and including the common femoral, femoral, profunda femoral, popliteal and calf veins including the posterior tibial, peroneal and gastrocnemius veins when visible. The superficial great saphenous vein was also interrogated. Spectral Doppler was utilized to evaluate flow at rest and with distal augmentation maneuvers in the common femoral, femoral and popliteal veins. COMPARISON:  Chest CT-04/19/2022 FINDINGS: RIGHT LOWER EXTREMITY Common Femoral Vein: No evidence of thrombus. Normal compressibility, respiratory phasicity and response to augmentation. Saphenofemoral Junction: No evidence of thrombus. Normal compressibility and flow on color Doppler imaging. Profunda Femoral Vein: No evidence of thrombus. Normal compressibility and flow on color Doppler imaging. Femoral Vein: No evidence of thrombus. Normal compressibility, respiratory phasicity and response to augmentation. Popliteal Vein: No evidence of thrombus. Normal compressibility, respiratory phasicity and response to augmentation. Calf Veins: No evidence of thrombus. Normal compressibility and flow on color Doppler imaging. Superficial Great Saphenous Vein: No evidence of thrombus. Normal compressibility. Other Findings:  None. LEFT LOWER EXTREMITY Common Femoral Vein: No evidence of thrombus. Normal compressibility, respiratory phasicity and response to augmentation. Saphenofemoral Junction: No evidence of thrombus. Normal compressibility and flow on color Doppler imaging. Profunda Femoral Vein: No evidence of thrombus. Normal compressibility and flow on color Doppler imaging. Femoral Vein: No evidence of thrombus. Normal compressibility, respiratory phasicity and response to augmentation. Popliteal Vein: No evidence of thrombus. Normal compressibility, respiratory phasicity and response to  augmentation. Calf Veins: No evidence of  thrombus. Normal compressibility and flow on color Doppler imaging. Superficial Great Saphenous Vein: No evidence of thrombus. Normal compressibility. Other Findings:  None. IMPRESSION: No evidence of acute or chronic DVT within either lower extremity. Electronically Signed   By: Sandi Mariscal M.D.   On: 04/20/2022 15:37   ECHOCARDIOGRAM COMPLETE  Result Date: 04/19/2022    ECHOCARDIOGRAM REPORT   Patient Name:   AIDYNN MARTELL Date of Exam: 04/19/2022 Medical Rec #:  [id number]    Height:       63.0 in Accession #:    [id number]   Weight:       137.0 lb Date of Birth:  [birth year]   BSA:          1.646 m Patient Age:    16 years     BP:           129/81 mmHg Patient Gender: M            HR:           71 bpm. Exam Location:  [facility] Procedure: 2D Echo, Cardiac Doppler and Color Doppler Indications:     NSTEMI  History:         Patient has prior history of Echocardiogram examinations, most                  recent 12/28/2019. CHF, Acute MI, Previous Myocardial Infarction                  and CAD, Prior CABG, Arrythmias:Atrial Fibrillation; Risk                  Factors:Hypertension, Current Smoker and Dyslipidemia.                  Pulmonary embolus, AAA.  Sonographer:     Wenda Low Referring Phys:  [id number] JAN A MANSY Diagnosing Phys: Kate Sable MD IMPRESSIONS  1. Left ventricular ejection fraction, by estimation, is 55 to 60%. The left ventricle has normal function. The left ventricle has no regional wall motion abnormalities. There is moderate concentric left ventricular hypertrophy. Left ventricular diastolic parameters are consistent with Grade I diastolic dysfunction (impaired relaxation).  2. Right ventricular systolic function is normal. The right ventricular size is normal. There is normal pulmonary artery systolic pressure.  3. The mitral valve is normal in structure. No evidence of mitral valve regurgitation.  4. The aortic valve is tricuspid. Aortic valve regurgitation is not visualized. Aortic  valve sclerosis/calcification is present, without any evidence of aortic stenosis.  5. The inferior vena cava is normal in size with greater than 50% respiratory variability, suggesting right atrial pressure of 3 mmHg. FINDINGS  Left Ventricle: Left ventricular ejection fraction, by estimation, is 55 to 60%. The left ventricle has normal function. The left ventricle has no regional wall motion abnormalities. The left ventricular internal cavity size was normal in size. There is  moderate concentric left ventricular hypertrophy. Left ventricular diastolic parameters are consistent with Grade I diastolic dysfunction (impaired relaxation). Right Ventricle: The right ventricular size is normal. No increase in right ventricular wall thickness. Right ventricular systolic function is normal. There is normal pulmonary artery systolic pressure. The tricuspid regurgitant velocity is 2.18 m/s, and  with an assumed right atrial pressure of 3 mmHg, the estimated right ventricular systolic pressure is [id number] mmHg. Left Atrium: Left atrial size was normal in size. Right Atrium: Right atrial size was normal in size. Pericardium: There is  no evidence of pericardial effusion. Mitral Valve: The mitral valve is normal in structure. No evidence of mitral valve regurgitation. MV peak gradient, 6.4 mmHg. The mean mitral valve gradient is 2.0 mmHg. Tricuspid Valve: The tricuspid valve is normal in structure. Tricuspid valve regurgitation is trivial. Aortic Valve: The aortic valve is tricuspid. Aortic valve regurgitation is not visualized. Aortic valve sclerosis/calcification is present, without any evidence of aortic stenosis. Aortic valve mean gradient measures 6.0 mmHg. Aortic valve peak gradient measures 11.0 mmHg. Aortic valve area, by VTI measures 2.03 cm. Pulmonic Valve: The pulmonic valve was normal in structure. Pulmonic valve regurgitation is not visualized. Aorta: The aortic root is normal in size and structure. Venous: The  inferior vena cava is normal in size with greater than 50% respiratory variability, suggesting right atrial pressure of 3 mmHg. IAS/Shunts: No atrial level shunt detected by color flow Doppler.  LEFT VENTRICLE PLAX 2D LVIDd:         3.80 cm   Diastology LVIDs:         2.90 cm   LV e' medial:    5.44 cm/s LV PW:         1.70 cm   LV E/e' medial:  9.3 LV IVS:        1.40 cm   LV e' lateral:   12.20 cm/s LVOT diam:     1.90 cm   LV E/e' lateral: 4.2 LV SV:         62 LV SV Index:   38 LVOT Area:     2.84 cm  RIGHT VENTRICLE RV Basal diam:  4.10 cm RV Mid diam:    3.60 cm RV S prime:     6.96 cm/s LEFT ATRIUM             Index        RIGHT ATRIUM           Index LA diam:        3.90 cm 2.37 cm/m   RA Area:     14.60 cm LA Vol (A2C):   62.0 ml 37.66 ml/m  RA Volume:   30.80 ml  18.71 ml/m LA Vol (A4C):   31.5 ml 19.13 ml/m LA Biplane Vol: 44.0 ml 26.72 ml/m  AORTIC VALVE                     PULMONIC VALVE AV Area (Vmax):    2.17 cm      PV Vmax:       1.12 m/s AV Area (Vmean):   2.10 cm      PV Peak grad:  5.0 mmHg AV Area (VTI):     2.03 cm AV Vmax:           166.00 cm/s AV Vmean:          119.000 cm/s AV VTI:            0.307 m AV Peak Grad:      11.0 mmHg AV Mean Grad:      6.0 mmHg LVOT Vmax:         127.00 cm/s LVOT Vmean:        88.000 cm/s LVOT VTI:          0.220 m LVOT/AV VTI ratio: 0.72  AORTA Ao Root diam: 3.60 cm MITRAL VALVE               TRICUSPID VALVE MV Area (PHT): 2.70 cm    TR Peak  grad:   19.0 mmHg MV Area VTI:   2.00 cm    TR Vmax:        218.00 cm/s MV Peak grad:  6.4 mmHg MV Mean grad:  2.0 mmHg    SHUNTS MV Vmax:       1.26 m/s    Systemic VTI:  0.22 m MV Vmean:      58.5 cm/s   Systemic Diam: 1.90 cm MV Decel Time: 281 msec MV E velocity: 50.80 cm/s MV A velocity: 68.10 cm/s MV E/A ratio:  0.75 Kate Sable MD Electronically signed by Kate Sable MD Signature Date/Time: 04/19/2022/4:02:54 PM    Final    CT ABDOMEN PELVIS W CONTRAST  Result Date: 04/19/2022 CLINICAL DATA:   Abdominal pain, acute, nonlocalized; Pulmonary embolism (PE) suspected, high prob EXAM: CT ANGIOGRAPHY CHEST CT ABDOMEN AND PELVIS WITH CONTRAST TECHNIQUE: Multidetector CT imaging of the chest was performed using the standard protocol during bolus administration of intravenous contrast. Multiplanar CT image reconstructions and MIPs were obtained to evaluate the vascular anatomy. Multidetector CT imaging of the abdomen and pelvis was performed using the standard protocol during bolus administration of intravenous contrast. RADIATION DOSE REDUCTION: This exam was performed according to the departmental dose-optimization program which includes automated exposure control, adjustment of the mA and/or kV according to patient size and/or use of iterative reconstruction technique. CONTRAST:  192mL OMNIPAQUE IOHEXOL 350 MG/ML SOLN COMPARISON:  11/16/2021 FINDINGS: CTA CHEST FINDINGS Cardiovascular: There is adequate opacification of the pulmonary arterial tree. A small intraluminal filling defect is identified within the segmental pulmonary arteries of the left lower lobe in keeping with changes of acute pulmonary embolism. The embolic burden is small. The central pulmonary arteries are of normal caliber. No CT evidence of right heart strain. Coronary artery bypass grafting has been performed. Cardiac size is globally within normal limits though left ventricular hypertrophy is noted. No pericardial effusion. Mild atherosclerotic calcification noted within the thoracic aorta. No aortic aneurysm. Right internal jugular hemodialysis catheter tip noted within the superior right atrium. Mediastinum/Nodes: No enlarged mediastinal, hilar, or axillary lymph nodes. Thyroid gland, trachea, and esophagus demonstrate no significant findings. Lungs/Pleura: Moderate emphysema. No superimposed focal pulmonary infiltrate. No pneumothorax or pleural effusion. No central obstructing lesion. Musculoskeletal: Nonunion of the sternal body  noted. No acute bone abnormality. No lytic or blastic bone lesion. Review of the MIP images confirms the above findings. CT ABDOMEN and PELVIS FINDINGS Hepatobiliary: Multiple simple cysts and at least 3 benign cavernous hemangioma are seen within the liver. No suspicious enhancing intrahepatic masses identified. No intra or extrahepatic biliary ductal dilation. Gallbladder unremarkable. Pancreas: Unremarkable. No pancreatic ductal dilatation or surrounding inflammatory changes. Spleen: Normal in size without focal abnormality. Adrenals/Urinary Tract: The adrenal glands are unremarkable. The kidneys are markedly atrophic bilaterally but are otherwise unremarkable. The bladder is decompressed Stomach/Bowel: Stomach is within normal limits. Appendix appears normal. No evidence of bowel wall thickening, distention, or inflammatory changes. Vascular/Lymphatic: Extensive aortoiliac atherosclerotic calcification. No aortic aneurysm. No pathologic adenopathy within the abdomen and pelvis. Reproductive: Prostate is unremarkable. Other: No abdominal wall hernia or abnormality. No abdominopelvic ascites. Musculoskeletal: No acute bone abnormality. No lytic or blastic bone lesion. Lumbar levoscoliosis noted. Degenerative changes noted at the lumbosacral junction. Review of the MIP images confirms the above findings. IMPRESSION: 1. Acute pulmonary embolism. The embolic burden is small. No CT evidence of right heart strain. 2. Left ventricular hypertrophy. 3. Moderate emphysema. 4. No acute intra-abdominal pathology identified. 5. Marked bilateral renal atrophy  in keeping with chronic renal failure. 6. Extensive aortoiliac atherosclerotic calcification. Aortic Atherosclerosis (ICD10-I70.0) and Emphysema (ICD10-J43.9). Electronically Signed   By: Fidela Salisbury M.D.   On: 04/19/2022 00:57   CT Angio Chest PE W and/or Wo Contrast  Result Date: 04/19/2022 CLINICAL DATA:  Abdominal pain, acute, nonlocalized; Pulmonary embolism  (PE) suspected, high prob EXAM: CT ANGIOGRAPHY CHEST CT ABDOMEN AND PELVIS WITH CONTRAST TECHNIQUE: Multidetector CT imaging of the chest was performed using the standard protocol during bolus administration of intravenous contrast. Multiplanar CT image reconstructions and MIPs were obtained to evaluate the vascular anatomy. Multidetector CT imaging of the abdomen and pelvis was performed using the standard protocol during bolus administration of intravenous contrast. RADIATION DOSE REDUCTION: This exam was performed according to the departmental dose-optimization program which includes automated exposure control, adjustment of the mA and/or kV according to patient size and/or use of iterative reconstruction technique. CONTRAST:  139mL OMNIPAQUE IOHEXOL 350 MG/ML SOLN COMPARISON:  11/16/2021 FINDINGS: CTA CHEST FINDINGS Cardiovascular: There is adequate opacification of the pulmonary arterial tree. A small intraluminal filling defect is identified within the segmental pulmonary arteries of the left lower lobe in keeping with changes of acute pulmonary embolism. The embolic burden is small. The central pulmonary arteries are of normal caliber. No CT evidence of right heart strain. Coronary artery bypass grafting has been performed. Cardiac size is globally within normal limits though left ventricular hypertrophy is noted. No pericardial effusion. Mild atherosclerotic calcification noted within the thoracic aorta. No aortic aneurysm. Right internal jugular hemodialysis catheter tip noted within the superior right atrium. Mediastinum/Nodes: No enlarged mediastinal, hilar, or axillary lymph nodes. Thyroid gland, trachea, and esophagus demonstrate no significant findings. Lungs/Pleura: Moderate emphysema. No superimposed focal pulmonary infiltrate. No pneumothorax or pleural effusion. No central obstructing lesion. Musculoskeletal: Nonunion of the sternal body noted. No acute bone abnormality. No lytic or blastic bone  lesion. Review of the MIP images confirms the above findings. CT ABDOMEN and PELVIS FINDINGS Hepatobiliary: Multiple simple cysts and at least 3 benign cavernous hemangioma are seen within the liver. No suspicious enhancing intrahepatic masses identified. No intra or extrahepatic biliary ductal dilation. Gallbladder unremarkable. Pancreas: Unremarkable. No pancreatic ductal dilatation or surrounding inflammatory changes. Spleen: Normal in size without focal abnormality. Adrenals/Urinary Tract: The adrenal glands are unremarkable. The kidneys are markedly atrophic bilaterally but are otherwise unremarkable. The bladder is decompressed Stomach/Bowel: Stomach is within normal limits. Appendix appears normal. No evidence of bowel wall thickening, distention, or inflammatory changes. Vascular/Lymphatic: Extensive aortoiliac atherosclerotic calcification. No aortic aneurysm. No pathologic adenopathy within the abdomen and pelvis. Reproductive: Prostate is unremarkable. Other: No abdominal wall hernia or abnormality. No abdominopelvic ascites. Musculoskeletal: No acute bone abnormality. No lytic or blastic bone lesion. Lumbar levoscoliosis noted. Degenerative changes noted at the lumbosacral junction. Review of the MIP images confirms the above findings. IMPRESSION: 1. Acute pulmonary embolism. The embolic burden is small. No CT evidence of right heart strain. 2. Left ventricular hypertrophy. 3. Moderate emphysema. 4. No acute intra-abdominal pathology identified. 5. Marked bilateral renal atrophy in keeping with chronic renal failure. 6. Extensive aortoiliac atherosclerotic calcification. Aortic Atherosclerosis (ICD10-I70.0) and Emphysema (ICD10-J43.9). Electronically Signed   By: Fidela Salisbury M.D.   On: 04/19/2022 00:57   DG Chest Portable 1 View  Result Date: 04/19/2022 CLINICAL DATA:  Three weeks history of shortness of breath. EXAM: PORTABLE CHEST 1 VIEW COMPARISON:  CTA chest 11/16/21. FINDINGS: Again noted  borderline cardiomegaly with old CABG. No vascular congestion is seen. Right IJ dialysis  catheter again noted with tip about the superior cavoatrial junction. Stable mediastinum with aortic tortuosity and calcific plaque. The lungs are emphysematous but clear. No pleural effusion. S shaped thoracolumbar scoliosis. IMPRESSION: 1. No evidence of acute chest process. Emphysema. 2. Aortic atherosclerosis and tortuosity. Electronically Signed   By: Telford Nab M.D.   On: 04/19/2022 00:07    Microbiology: Results for orders placed or performed during the hospital encounter of 05/03/22  Culture, blood (routine x 2)     Status: None (Preliminary result)   Collection Time: 05/04/22 12:09 AM   Specimen: BLOOD  Result Value Ref Range Status   Specimen Description BLOOD  RAC  Final   Special Requests   Final    BOTTLES DRAWN AEROBIC AND ANAEROBIC Blood Culture results may not be optimal due to an inadequate volume of blood received in culture bottles   Culture   Final    NO GROWTH 3 DAYS Performed at [facility], [address], [place], [place] [address]    Report Status PENDING  Incomplete  Culture, blood (routine x 2)     Status: None (Preliminary result)   Collection Time: 05/04/22 12:10 AM   Specimen: BLOOD  Result Value Ref Range Status   Specimen Description BLOOD  RIGHT FOREARM  Final   Special Requests   Final    BOTTLES DRAWN AEROBIC AND ANAEROBIC Blood Culture adequate volume   Culture   Final    NO GROWTH 3 DAYS Performed at [facility], [address], [place], [place] [address]    Report Status PENDING  Incomplete  Aerobic/Anaerobic Culture w Gram Stain (surgical/deep wound)     Status: None (Preliminary result)   Collection Time: 05/05/22  4:11 PM   Specimen: Abdomen; Body Fluid  Result Value Ref Range Status   Specimen Description   Final    ABDOMEN Performed at [facility], [address], [place], [place] [address]    Special Requests    Final    NONE Performed at [facility], [address] [place], [place] [address]    Gram Stain   Final    NO WBC SEEN NO ORGANISMS SEEN Performed at [facility], [place] [address] [place], [place] [address]    Culture   Final    CULTURE REINCUBATED FOR BETTER GROWTH NO ANAEROBES ISOLATED; CULTURE IN PROGRESS FOR 5 DAYS   Report Status PENDING  Incomplete    Labs: CBC: Recent Labs  Lab 05/01/22 0611 05/03/22 2234 05/04/22 0507 05/06/22 0827 05/07/22 0552  WBC 8.3 14.5*  --  9.6  --   NEUTROABS  --  11.5*  --   --   --   HGB 9.3* 9.3* 10.2* 8.7* 8.9*  HCT 29.8* 31.6*  --  29.7*  --   MCV 92.5 98.1  --  98.0  --   PLT 323 312  --  340  --    Basic Metabolic Panel: Recent Labs  Lab 05/01/22 0611 05/03/22 2234 05/04/22 0507 05/05/22 0535 05/06/22 0827  NA 141 137  --   --  137  K 4.3 6.1* 5.5* 4.9 5.2*  CL 101 101  --   --  104  CO2 25 24  --   --  25  GLUCOSE 97 87  --   --  74  BUN 58* 69*  --   --  55*  CREATININE 9.50* 9.97*  --   --  8.48*  CALCIUM 7.7* 8.5*  --   --  7.6*  MG 2.4  --   --   --   --   PHOS 8.9*  --   --   --   --    Liver Function Tests: Recent Labs  Lab 05/03/22 2234  AST 14*  ALT 8  ALKPHOS 47  BILITOT 0.6  PROT 6.2*  ALBUMIN 2.7*     Discharge time spent: greater than 30 minutes.  Signed: Loletha Grayer, MD [facility] 05/07/2022

## 2022-05-07 NOTE — Progress Notes (Signed)
Patient ID: Anthony Hess, male   DOB: 03/06/1955, 67 y.o.   MRN: [id number]     [facility] Day(s): 3.   Interval History: Patient seen and examined, no acute events or new complaints overnight. Patient reports he is ready to go home.  Denies significant abdominal pain.  Tolerating diet.  Vital signs in last 24 hours: [min-max] current  Temp:  [97.6 F (36.4 C)-98.7 F (37.1 C)] 98.3 F (36.8 C) (03/15 0724) Pulse Rate:  [59-83] 59 (03/15 0724) Resp:  [11-36] 13 (03/15 0724) BP: (81-180)/(54-117) 151/101 (03/15 0724) SpO2:  [90 %-97 %] 93 % (03/15 0724) Weight:  [54 kg-54.4 kg] 54 kg (03/14 1213)     Height: 5\' 3"  (160 cm) Weight: 54 kg BMI (Calculated): 21.09   Physical Exam:  Constitutional: alert, cooperative and no distress  Respiratory: breathing non-labored at rest  Cardiovascular: regular rate and sinus rhythm  Gastrointestinal: soft, non-tender, and non-distended.  Wounds are clean.  Staples removed.  Labs:     Latest Ref Rng & Units 05/07/2022    5:52 AM 05/06/2022    8:27 AM 05/04/2022    5:07 AM  CBC  WBC 4.0 - 10.5 K/uL  9.6    Hemoglobin 13.0 - 17.0 g/dL 8.9  8.7  10.2   Hematocrit 39.0 - 52.0 %  29.7    Platelets 150 - 400 K/uL  340        Latest Ref Rng & Units 05/06/2022    8:27 AM 05/05/2022    5:35 AM 05/04/2022    5:07 AM  CMP  Glucose 70 - 99 mg/dL 74     BUN 8 - 23 mg/dL 55     Creatinine 0.61 - 1.24 mg/dL 8.48     Sodium 135 - 145 mmol/L 137     Potassium 3.5 - 5.1 mmol/L 5.2  4.9  5.5   Chloride 98 - 111 mmol/L 104     CO2 22 - 32 mmol/L 25     Calcium 8.9 - 10.3 mg/dL 7.6       Imaging studies: No new pertinent imaging studies   Assessment/Plan:  Patient was admitted with abdominal pain.  Concerning for intra-abdominal abscess.  IR drain the fluid collection which was serosanguineous, no purulence.  No intra-abdominal abscess was identified.  White blood cell count has resolved.  Hemoglobin stable.  No surgical  intervention is needed.  No contraindication for discharge from surgical standpoint when medically stable.  Arnold Long, MD

## 2022-05-09 LAB — CULTURE, BLOOD (ROUTINE X 2)
Culture: NO GROWTH
Culture: NO GROWTH
Special Requests: ADEQUATE

## 2022-05-10 LAB — AEROBIC/ANAEROBIC CULTURE W GRAM STAIN (SURGICAL/DEEP WOUND): Gram Stain: NONE SEEN

## 2022-06-24 NOTE — Progress Notes (Signed)
New patient visit   Patient: Anthony Hess   DOB: [birth year]   67 y.o. Male  MRN: [id number] Visit Date: 06/25/2022  Today's healthcare provider: Ronnald Ramp, MD   Chief Complaint  Patient presents with   Establish Care   New Patient (Initial Visit)    Establish--   Subjective    Anthony Hess is a 67 y.o. male who presents today as a new patient to establish care.  HPI HPI     New Patient (Initial Visit)    Additional comments: Establish--      Last edited by Shelly Bombard, CMA on 06/25/2022  1:07 PM.          Encounter to Establish Care Patient presents to establish care  Introduced myself and my role as primary care physician  We reviewed patient's medical, surgical, and social history and medications as listed below    PMHX   Last annual physical: unknown   Heart Failure  Reports symptoms are stable and that he recently saw his cardiologist  [facility] for cardiology    ESRD on HD T/Th/Sa in [place]  Dr. Cherylann Ratel  Limited to 32 fl oz per day  Patient reports that he is on the transplant list    Chronic Nausea and Abdominal Pain  Patient was recommended to see GI doc, has appt in June to evaluate  Patient reports that he is chronically nauseous and cannot vomit  Reports having normal bowel movement   Bladder cancer: had a bag to drain kidney, did follow up with negative results, still has bladder,     Social Hx  Tobacco use: reports smoking 1/2 pack per day, cutting down  Alcohol Use : denies  Illicit drug use: denies    Does dishes at [employer]    Concerns for Today:   Nausea: patient has not taking   Hoarseness: three weeks ago, denies dysphagia, has to talk quietly, denies sore throat, reports nasocort nasal spray and hoarseness was    Past Medical History:  Diagnosis Date   Aortic atherosclerosis (HCC)    Bilateral carotid artery disease (HCC)    Bladder cancer (HCC)    Coronary artery disease 12/20/2018   a.) LHC  12/20/2018: 50% OM1, 40% OM2, 95% o-pLAD, 75% o=pLCx, 40% mLM, 70% D1, 60% mRCA-1, 50% mRCA-2; refer to CVTS. b.) 4v CABG [facility] on 12/27/2018: LIMA-LAD, RIMA-PDA, seg LRA-OM1-D1   DCM (dilated cardiomyopathy) (HCC) 12/05/2018   a.) TTE 12/05/2018: EF 40-45%. b.) TTE 12/28/2019: EF 20-25%.   ESRD (end stage renal disease) (HCC)    a.) T-Th-Sat   HFrEF (heart failure with reduced ejection fraction) (HCC) 12/05/2018   a.) TTE 12/05/2018: EF 40-45%; mild LVH; ant/apical/sep HK; mild TR . b.) TTE 12/28/2019: EF 20-25%; mod LVH; mod MR/AR; G1DD.   History of 2019 novel coronavirus disease (COVID-19) 04/08/2021   History of kidney stones    HLD (hyperlipidemia)    Hx of CABG 12/27/2018   Hypertension    Infrarenal abdominal aortic aneurysm (AAA) without rupture (HCC) 03/05/2021   a.) CT abd/pelvis; measured 3.2 cm.   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   a.) LIMA-LAD, RIMA-PDA, sequential LEFT radial artery to OM1 and D1   Sepsis (HCC) 03/14/2021   Wears glasses    Past Surgical History:  Procedure  Laterality Date   AV FISTULA PLACEMENT Left 07/30/2021   Procedure: INSERTION OF ARTERIOVENOUS (AV) GORE-TEX GRAFT ARM BRACHIAL ARTERY TO AXILLARY VEIN;  Surgeon: Annice Needy, MD;  Location: [facility] ORS;  Service: Vascular;  Laterality: Left;   CAPD INSERTION N/A 12/31/2019   Procedure: LAPAROSCOPIC INSERTION CONTINUOUS AMBULATORY PERITONEAL DIALYSIS  (CAPD) CATHETER;  Surgeon: Leafy Ro, MD;  Location: [facility] ORS;  Service: General;  Laterality: N/A;   CAPD REMOVAL N/A 04/10/2020   Procedure: LAPAROSCOPIC REVISION OF CONTINUOUS AMBULATORY PERITONEAL DIALYSIS  (CAPD) CATHETER;  Surgeon: Leafy Ro, MD;  Location: [facility] ORS;  Service: General;  Laterality: N/A;   CORONARY ARTERY BYPASS GRAFT N/A 12/27/2018    Procedure: CORONARY ARTERY BYPASS GRAFTING (CABG) X 4 ON PUMP USING RIGHT & LEFT INTERNAL MAMMARY ARTERY LEFT RADIAL ARTERY ENDOSCOPICALLY HARVESTED;  Surgeon: Linden Dolin, MD;  [facility];  Service: Open Heart Surgery;  Laterality: N/A;   CYSTOSCOPY W/ RETROGRADES Bilateral 05/15/2019   Procedure: CYSTOSCOPY WITH RETROGRADE PYELOGRAM;  Surgeon: Riki Altes, MD;  Location: [facility] ORS;  Service: Urology;  Laterality: Bilateral;   CYSTOSCOPY WITH BIOPSY N/A 05/15/2019   Procedure: CYSTOSCOPY WITH bladder BIOPSY;  Surgeon: Riki Altes, MD;  Location: [facility] ORS;  Service: Urology;  Laterality: N/A;   DIALYSIS/PERMA CATHETER INSERTION N/A 12/28/2019   Procedure: DIALYSIS/PERMA CATHETER INSERTION;  Surgeon: Annice Needy, MD;  Location: [facility] INVASIVE [facility] LAB;  Service: Cardiovascular;  Laterality: N/A;   DIALYSIS/PERMA CATHETER INSERTION N/A 03/18/2021   Procedure: DIALYSIS/PERMA CATHETER INSERTION;  Surgeon: Annice Needy, MD;  Location: [facility];  Service: Cardiovascular;  Laterality: N/A;   DIALYSIS/PERMA CATHETER REMOVAL N/A 06/02/2020   Procedure: DIALYSIS/PERMA CATHETER REMOVAL;  Surgeon: Annice Needy, MD;  Location: [facility];  Service: Cardiovascular;  Laterality: N/A;   EXCHANGE OF A DIALYSIS CATHETER Right 04/10/2020   Procedure: EXCHANGE OF A DIALYSIS CATHETER;  Surgeon: Leafy Ro, MD;  Location: [facility] OR[facility];  Service: General;  Laterality: Right;   INCISIONAL HERNIA REPAIR  01/20/2021   Procedure: HERNIA REPAIR INCISIONAL;  Surgeon: Henrene Dodge, MD;  Location: [facility] OR[facility];  Service: General;;   IR IMAGE GUIDED DRAINAGE PERCUT CATH  PERITONEAL RETROPERIT  04/07/2020   LAPAROSCOPY N/A 04/16/2021   Procedure: LAPAROSCOPY DIAGNOSTIC;  Surgeon: Sung Amabile, DO;  Location: [facility] ORS;  Service: General;  Laterality: N/A;   LAPAROTOMY N/A 04/26/2022   Procedure: EXPLORATORY LAPAROTOMY WITH REPAIR OF DUODENAL ULCER;  Surgeon: Carolan Shiver, MD;  Location: [facility]  ORS;  Service: General;  Laterality: N/A;   LEFT HEART CATH AND CORONARY ANGIOGRAPHY Left 12/20/2018   Procedure: LEFT HEART CATH AND CORONARY ANGIOGRAPHY;  Surgeon: Marcina Millard, MD;  Location: [facility];  Service: Cardiovascular;  Laterality: Left;   RADIAL ARTERY HARVEST Left 12/27/2018   Procedure: ENDOSCOPIC RADIAL ARTERY HARVEST;  Surgeon: Linden Dolin, MD;  [facility]: [facility];  Service: Open Heart Surgery;  Laterality: Left;   REMOVAL OF A DIALYSIS CATHETER Left 03/20/2021   Procedure: REMOVAL OF A PD CATHETER;  Surgeon: Annice Needy, MD;  Location: [facility] OR[facility];  Service: Vascular;  Laterality: Left;   REVISION OF ARTERIOVENOUS GORETEX GRAFT Left 09/11/2021   Procedure: Excisionof infected AV graft;  Surgeon: Renford Dills, MD;  Location: [facility] ORS;  Service: Vascular;  Laterality: Left;   TEE WITHOUT CARDIOVERSION N/A 12/27/2018   Procedure: TRANSESOPHAGEAL ECHOCARDIOGRAM (TEE);  Surgeon: Linden Dolin, MD;  Location: [facility] OR;  Service: Open Heart  Surgery;  Laterality: N/A;   TUMOR REMOVAL  2019   Bladder   Family Status  Relation Name Status   Mother  Deceased   Father  Deceased   Family History  Family history unknown: Yes   Social History   Socioeconomic History   Marital status: Married    Spouse name: Not on file   Number of children: Not on file   Years of education: Not on file   Highest education level: Not on file  Occupational History   Not on file  Tobacco Use   Smoking status: Every Day    Packs/day: .5    Types: Cigarettes    Last attempt to quit: 02/19/2019    Years since quitting: 3.3   Smokeless tobacco: Never  Vaping Use   Vaping Use: Never used  Substance and Sexual Activity   Alcohol use: Never   Drug use: Never   Sexual activity: Yes  Other Topics Concern   Not on file  Social History Narrative   Not on file   Social Determinants of Health   Financial Resource Strain: Not on file  Food Insecurity: No Food Insecurity  (05/06/2022)   Hunger Vital Sign    Worried About Running Out of Food in the Last Year: Never true    Ran Out of Food in the Last Year: Never true  Transportation Needs: No Transportation Needs (05/06/2022)   PRAPARE - Administrator, Civil Service (Medical): No    Lack of Transportation (Non-Medical): No  Physical Activity: Not on file  Stress: Not on file  Social Connections: Not on file   Outpatient Medications Prior to Visit  Medication Sig   acetaminophen (TYLENOL) 500 MG tablet Take 2 tablets (1,000 mg total) by mouth every 6 (six) hours as needed for mild pain, fever or headache (or Fever >/= 101).   amLODipine (NORVASC) 2.5 MG tablet Take 1 tablet (2.5 mg total) by mouth daily.   apixaban (ELIQUIS) 5 MG TABS tablet Take 1 tablet (5 mg total) by mouth 2 (two) times daily.   Aspirin 81 MG CAPS Take by mouth.   atorvastatin (LIPITOR) 80 MG tablet Take 1 tablet (80 mg total) by mouth daily.   calcitRIOL (ROCALTROL) 0.5 MCG capsule Take 1 capsule (0.5 mcg total) by mouth daily.   calcium acetate (PHOSLO) 667 MG capsule Take 1 capsule (667 mg total) by mouth 3 (three) times daily with meals.   hydrALAZINE (APRESOLINE) 25 MG tablet Take 1 tablet (25 mg total) by mouth 3 (three) times daily.   isosorbide dinitrate (ISORDIL) 10 MG tablet Take 1 tablet (10 mg total) by mouth 3 (three) times daily.   metoprolol succinate (TOPROL-XL) 25 MG 24 hr tablet Take 25 mg by mouth daily.   multivitamin (RENA-VIT) TABS tablet Take 1 tablet by mouth at bedtime.   sucroferric oxyhydroxide (VELPHORO) 500 MG chewable tablet Chew 1 tablet (500 mg total) by mouth 3 (three) times daily with meals.   Vitamin D, Ergocalciferol, (DRISDOL) 1.25 MG (50000 UNIT) CAPS capsule Take 1 capsule (50,000 Units total) by mouth every 7 (seven) days.   pantoprazole (PROTONIX) 40 MG tablet Take 1 tablet (40 mg total) by mouth daily.   No facility-administered medications prior to visit.   No Known  Allergies  Immunization History  Administered Date(s) Administered   Influenza-Unspecified 10/24/2019   PFIZER Comirnaty(Gray Top)Covid-19 Tri-Sucrose Vaccine 06/12/2019, 01/02/2020   PFIZER(Purple Top)SARS-COV-2 Vaccination 05/22/2019   Pneumococcal Polysaccharide-23 01/05/2019    Health Maintenance  Topic Date Due   Medicare Annual Wellness (AWV)  Never done   DTaP/Tdap/Td (1 - Tdap) Never done   Zoster Vaccines- Shingrix (1 of 2) Never done   COLONOSCOPY (Pts 45-12yrs Insurance coverage will need to be confirmed)  Never done   COVID-19 Vaccine (5 - 2023-24 season) 10/23/2021   INFLUENZA VACCINE  09/23/2022   Hepatitis C Screening  Completed   HIV Screening  Completed   HPV VACCINES  Aged Out    Patient Care Team: Ronnald Ramp, MD as PCP - General (Family Medicine)  Review of Systems     Objective    BP (!) 142/88   Pulse 86   Ht 5\' 3"  (1.6 m)   Wt 114 lb (51.7 kg)   SpO2 96%   BMI 20.19 kg/m    Physical Exam Vitals reviewed.  Constitutional:      General: He is not in acute distress.    Appearance: Normal appearance. He is not ill-appearing, toxic-appearing or diaphoretic.     Comments: Chronically ill appearing, small framed male in NAD   Eyes:     Conjunctiva/sclera: Conjunctivae normal.  Cardiovascular:     Rate and Rhythm: Normal rate and regular rhythm.     Pulses: Normal pulses.     Heart sounds: Normal heart sounds. No murmur heard.    No friction rub. No gallop.  Pulmonary:     Effort: Pulmonary effort is normal. No respiratory distress.     Breath sounds: Normal breath sounds. No stridor. No wheezing, rhonchi or rales.  Chest:     Comments: Multiple post surgical scars, healed on the chest  Abdominal:     General: Abdomen is scaphoid. A surgical scar is present. Bowel sounds are normal. There is no distension.     Palpations: Abdomen is soft. There is no hepatomegaly, mass or pulsatile mass.     Tenderness: There is no abdominal  tenderness.  Musculoskeletal:     Right lower leg: No edema.     Left lower leg: No edema.  Skin:    Capillary Refill: Capillary refill takes 2 to 3 seconds.     Findings: Rash present. No erythema. Rash is scaling.       Neurological:     Mental Status: He is alert and oriented to person, place, and time.     Depression Screen    06/25/2022    1:10 PM 01/11/2020    1:14 PM 03/20/2018    2:36 PM  PHQ 2/9 Scores  PHQ - 2 Score 0 0 0  PHQ- 9 Score 0  3   No results found for any visits on 06/25/22.  Assessment & Plan      Problem List Items Addressed This Visit       Cardiovascular and Mediastinum   Hypertension - Primary    Chronic Blood pressure above goal of less than 140/90 Patient will continue blood pressure medications Patient to continue following up with cardiology as scheduled He will continue isosorbide dinitrate 10 mg 3 times daily and amlodipine 2.5 mg daily He will continue metoprolol 25 mg daily CMP collected today      Relevant Medications   Aspirin 81 MG CAPS   Other Relevant Orders   Hemoglobin A1c   Comprehensive metabolic panel   Atrial fibrillation (HCC)    Chronic  Follows with cardiology  Stable on eliquis 2.5mg  BID and metoprolol 25mg  daily       Relevant Medications   Aspirin 81 MG CAPS  Chronic systolic heart failure (HCC)    Chronic  Follows with cardiology  Continue current medications        Relevant Medications   Aspirin 81 MG CAPS     Respiratory   Other emphysema (HCC)    Chronic Stable Patient without report of symptoms today       Relevant Medications   triamcinolone (NASACORT) 55 MCG/ACT AERO nasal inhaler   cetirizine (ZYRTEC) 5 MG chewable tablet (Start on 06/26/2022)   Seasonal allergic rhinitis due to pollen    Recommended Nasacort nasal inhaler Prescribed cetirizine 5 mg 3 days/week       Relevant Medications   triamcinolone (NASACORT) 55 MCG/ACT AERO nasal inhaler   cetirizine (ZYRTEC) 5 MG  chewable tablet (Start on 06/26/2022)     Endocrine   Secondary hyperparathyroidism of renal origin [facility])    Chronic Will collect PTH today and CMP      Relevant Orders   Parathyroid hormone, intact (no Ca)     Genitourinary   End-stage renal disease on hemodialysis (HCC)    Chronic Follows with nephrology Continue hemodialysis on Tuesday, Thursday, Saturday schedule       Relevant Orders   Hemoglobin A1c     Other   Tobacco dependence    Discussed recommendation for tobacco cessation       Moderate protein-calorie malnutrition (HCC)   Nausea    Reports chronic nausea Zofran prescribed 4 mg as needed Patient has gastroenterology referral in place with scheduled appointment       Relevant Medications   ondansetron (ZOFRAN-ODT) 4 MG disintegrating tablet   Other Relevant Orders   Parathyroid hormone, intact (no Ca)   Hemoglobin A1c   Comprehensive metabolic panel   Hoarseness of voice    This is acute problem Suspect that this may be secondary to allergies as patient's wife reports that his hoarse voice improved when he was on Nasacort Nasacort prescribed along with cetirizine 3 days weekly at 5 mg Patient will follow-up in 1-2 weeks for coarse voice, will recommend ear nose and throat referral if no improvement       Relevant Orders   TSH + free T4   Establishing care with new doctor, encounter for    Welcomed patient to [facility]  Reviewed patient's medical history, medications, surgical and social history Discussed roles and expectations for primary care physician-patient relationship Recommended patient schedule annual preventative examinations        Other Visit Diagnoses     Postsurgical malabsorption, not elsewhere classified       Relevant Orders   Hemoglobin A1c        Return in about 2 weeks (around 07/09/2022) for nausea and hoarse voice .        Ronnald Ramp, MD  [facility] [phone] (phone) [phone] (fax)  [facility]

## 2022-06-25 ENCOUNTER — Ambulatory Visit (INDEPENDENT_AMBULATORY_CARE_PROVIDER_SITE_OTHER): Payer: Medicare Other | Admitting: Family Medicine

## 2022-06-25 ENCOUNTER — Encounter: Payer: Self-pay | Admitting: Family Medicine

## 2022-06-25 VITALS — BP 142/88 | HR 86 | Ht 63.0 in | Wt 114.0 lb

## 2022-06-25 DIAGNOSIS — J301 Allergic rhinitis due to pollen: Secondary | ICD-10-CM

## 2022-06-25 DIAGNOSIS — E440 Moderate protein-calorie malnutrition: Secondary | ICD-10-CM

## 2022-06-25 DIAGNOSIS — N186 End stage renal disease: Secondary | ICD-10-CM

## 2022-06-25 DIAGNOSIS — I5042 Chronic combined systolic (congestive) and diastolic (congestive) heart failure: Secondary | ICD-10-CM | POA: Diagnosis not present

## 2022-06-25 DIAGNOSIS — R490 Dysphonia: Secondary | ICD-10-CM

## 2022-06-25 DIAGNOSIS — I10 Essential (primary) hypertension: Secondary | ICD-10-CM

## 2022-06-25 DIAGNOSIS — I4891 Unspecified atrial fibrillation: Secondary | ICD-10-CM

## 2022-06-25 DIAGNOSIS — R110 Nausea: Secondary | ICD-10-CM

## 2022-06-25 DIAGNOSIS — N2581 Secondary hyperparathyroidism of renal origin: Secondary | ICD-10-CM

## 2022-06-25 DIAGNOSIS — K912 Postsurgical malabsorption, not elsewhere classified: Secondary | ICD-10-CM

## 2022-06-25 DIAGNOSIS — J439 Emphysema, unspecified: Secondary | ICD-10-CM | POA: Diagnosis not present

## 2022-06-25 DIAGNOSIS — F17200 Nicotine dependence, unspecified, uncomplicated: Secondary | ICD-10-CM

## 2022-06-25 DIAGNOSIS — Z7689 Persons encountering health services in other specified circumstances: Secondary | ICD-10-CM | POA: Insufficient documentation

## 2022-06-25 MED ORDER — ONDANSETRON 4 MG PO TBDP: 4.0000 mg | ORAL_TABLET | Freq: Three times a day (TID) | ORAL | 0 refills | Status: DC | PRN

## 2022-06-25 MED ORDER — TRIAMCINOLONE ACETONIDE 55 MCG/ACT NA AERO: 2.0000 | INHALATION_SPRAY | Freq: Every day | NASAL | 12 refills | Status: DC

## 2022-06-25 MED ORDER — CETIRIZINE HCL 5 MG PO CHEW: 5.0000 mg | CHEWABLE_TABLET | ORAL | 2 refills | Status: DC

## 2022-06-25 NOTE — Assessment & Plan Note (Signed)
Welcomed patient to [facility]  Reviewed patient's medical history, medications, surgical and social history Discussed roles and expectations for primary care physician-patient relationship Recommended patient schedule annual preventative examinations   

## 2022-06-25 NOTE — Assessment & Plan Note (Signed)
Chronic Blood pressure above goal of less than 140/90 Patient will continue blood pressure medications Patient to continue following up with cardiology as scheduled He will continue isosorbide dinitrate 10 mg 3 times daily and amlodipine 2.5 mg daily He will continue metoprolol 25 mg daily CMP collected today

## 2022-06-25 NOTE — Assessment & Plan Note (Signed)
Reports chronic nausea Zofran prescribed 4 mg as needed Patient has gastroenterology referral in place with scheduled appointment

## 2022-06-25 NOTE — Assessment & Plan Note (Signed)
Chronic Will collect PTH today and CMP

## 2022-06-25 NOTE — Assessment & Plan Note (Signed)
Recommended Nasacort nasal inhaler Prescribed cetirizine 5 mg 3 days/week

## 2022-06-25 NOTE — Assessment & Plan Note (Signed)
Discussed recommendation for tobacco cessation

## 2022-06-25 NOTE — Patient Instructions (Signed)
It was a pleasure meeting you today!  Welcome to [facility].  I look forward to taking part in your care as your new primary care physician.    Summary of our discussion today:   I have prescribed Zofran 4mg  tablet to help with nausea  I have prescribed both a nasal spray and cetirizine to help with allergies. Please take the cetirizine on days that you have dialysis.  We will follow up with results of labs once they are available.    Please remember to schedule your annual physical one year from your last physical.   You should return to our clinic in 2 weeks for hoarseness and nausea.   Best Wishes,   Dr. Roxan Hockey

## 2022-06-25 NOTE — Assessment & Plan Note (Signed)
This is acute problem Suspect that this may be secondary to allergies as patient's wife reports that his hoarse voice improved when he was on Nasacort Nasacort prescribed along with cetirizine 3 days weekly at 5 mg Patient will follow-up in 1-2 weeks for coarse voice, will recommend ear nose and throat referral if no improvement

## 2022-06-25 NOTE — Assessment & Plan Note (Signed)
Chronic Stable Patient without report of symptoms today

## 2022-06-25 NOTE — Assessment & Plan Note (Signed)
Chronic Follows with cardiology Continue current medications 

## 2022-06-25 NOTE — Assessment & Plan Note (Addendum)
Chronic  Follows with cardiology  Stable on eliquis 2.5mg  BID and metoprolol 25mg  daily

## 2022-06-25 NOTE — Assessment & Plan Note (Signed)
Chronic Follows with nephrology Continue hemodialysis on Tuesday, Thursday, Saturday schedule

## 2022-06-27 LAB — COMPREHENSIVE METABOLIC PANEL
ALT: 11 IU/L (ref 0–44)
AST: 12 IU/L (ref 0–40)
Albumin/Globulin Ratio: 1.8 (ref 1.2–2.2)
Albumin: 4.4 g/dL (ref 3.9–4.9)
Alkaline Phosphatase: 70 IU/L (ref 44–121)
BUN/Creatinine Ratio: 5 — ABNORMAL LOW (ref 10–24)
BUN: 39 mg/dL — ABNORMAL HIGH (ref 8–27)
Bilirubin Total: <0.2 mg/dL (ref 0.0–1.2)
CO2: 16 mmol/L — ABNORMAL LOW (ref 20–29)
Calcium: 8.2 mg/dL — ABNORMAL LOW (ref 8.6–10.2)
Chloride: 100 mmol/L (ref 96–106)
Creatinine, Ser: 7.44 mg/dL — ABNORMAL HIGH (ref 0.76–1.27)
Globulin, Total: 2.4 g/dL (ref 1.5–4.5)
Glucose: 105 mg/dL — ABNORMAL HIGH (ref 70–99)
Potassium: 5 mmol/L (ref 3.5–5.2)
Sodium: 141 mmol/L (ref 134–144)
Total Protein: 6.8 g/dL (ref 6.0–8.5)
eGFR: 8 mL/min/{1.73_m2} — ABNORMAL LOW (ref >59)

## 2022-06-27 LAB — HEMOGLOBIN A1C
Est. average glucose Bld gHb Est-mCnc: 91 mg/dL
Hgb A1c MFr Bld: 4.8 % (ref 4.8–5.6)

## 2022-06-27 LAB — TSH+FREE T4
Free T4: 0.79 ng/dL — ABNORMAL LOW (ref 0.82–1.77)
TSH: 3.19 u[IU]/mL (ref 0.450–4.500)

## 2022-06-27 LAB — PARATHYROID HORMONE, INTACT (NO CA): PTH: 170 pg/mL — ABNORMAL HIGH (ref 15–65)

## 2022-07-08 NOTE — Progress Notes (Deleted)
Established patient visit   Patient: Anthony Hess   DOB: November 26, 1955   67 y.o. Male  MRN: [id number] Visit Date: 07/09/2022  Today's healthcare provider: Ronnald Ramp, MD   No chief complaint on file.  Subjective    HPI   Hoarse Voice Follow up Patient presents for follow up regarding hoarseness  He was started on cetirizine 5mg  three times per week and recommended to resume nasacort inhaler  Today he reports*** Associated symptoms were {positive/negative for:15211}. Symptoms of gastroesophageal reflux {is/are:5283} noted.    Medications: Outpatient Medications Prior to Visit  Medication Sig   acetaminophen (TYLENOL) 500 MG tablet Take 2 tablets (1,000 mg total) by mouth every 6 (six) hours as needed for mild pain, fever or headache (or Fever >/= 101).   amLODipine (NORVASC) 2.5 MG tablet Take 1 tablet (2.5 mg total) by mouth daily.   apixaban (ELIQUIS) 5 MG TABS tablet Take 1 tablet (5 mg total) by mouth 2 (two) times daily.   Aspirin 81 MG CAPS Take by mouth.   atorvastatin (LIPITOR) 80 MG tablet Take 1 tablet (80 mg total) by mouth daily.   calcitRIOL (ROCALTROL) 0.5 MCG capsule Take 1 capsule (0.5 mcg total) by mouth daily.   calcium acetate (PHOSLO) 667 MG capsule Take 1 capsule (667 mg total) by mouth 3 (three) times daily with meals.   cetirizine (ZYRTEC) 5 MG chewable tablet Chew 1 tablet (5 mg total) by mouth every Tuesday, Thursday, and Saturday at 6 PM.   hydrALAZINE (APRESOLINE) 25 MG tablet Take 1 tablet (25 mg total) by mouth 3 (three) times daily.   isosorbide dinitrate (ISORDIL) 10 MG tablet Take 1 tablet (10 mg total) by mouth 3 (three) times daily.   metoprolol succinate (TOPROL-XL) 25 MG 24 hr tablet Take 25 mg by mouth daily.   multivitamin (RENA-VIT) TABS tablet Take 1 tablet by mouth at bedtime.   ondansetron (ZOFRAN-ODT) 4 MG disintegrating tablet Take 1 tablet (4 mg total) by mouth every 8 (eight) hours as needed for nausea or vomiting.    pantoprazole (PROTONIX) 40 MG tablet Take 1 tablet (40 mg total) by mouth daily.   sucroferric oxyhydroxide (VELPHORO) 500 MG chewable tablet Chew 1 tablet (500 mg total) by mouth 3 (three) times daily with meals.   triamcinolone (NASACORT) 55 MCG/ACT AERO nasal inhaler Place 2 sprays into the nose daily.   Vitamin D, Ergocalciferol, (DRISDOL) 1.25 MG (50000 UNIT) CAPS capsule Take 1 capsule (50,000 Units total) by mouth every 7 (seven) days.   No facility-administered medications prior to visit.    Review of Systems  {Labs  Heme  Chem  Endocrine  Serology  Results Review (optional):23779}   Objective    There were no vitals taken for this visit. {Show previous vital signs (optional):23777}  Physical Exam  ***  No results found for any visits on 07/09/22.  Assessment & Plan     Problem List Items Addressed This Visit       Other   Hoarseness of voice - Primary     No follow-ups on file.        The entirety of the information documented in the History of Present Illness, Review of Systems and Physical Exam were personally obtained by me. Portions of this information were initially documented by Lubertha Basque, CMA  . I, Ronnald Ramp, MD have reviewed the documentation above for thoroughness and accuracy.      Ronnald Ramp, MD  [facility] [phone]  phone) [phone] (fax)  [facility]

## 2022-07-09 ENCOUNTER — Ambulatory Visit: Payer: Medicare Other | Admitting: Family Medicine

## 2022-08-10 ENCOUNTER — Emergency Department: Payer: Medicare Other

## 2022-08-10 ENCOUNTER — Other Ambulatory Visit: Payer: Self-pay

## 2022-08-10 ENCOUNTER — Emergency Department
Admission: EM | Admit: 2022-08-10 | Discharge: 2022-08-10 | Disposition: A | Discharge status: Home / Self Care | Payer: Medicare Other | Attending: Emergency Medicine | Admitting: Emergency Medicine

## 2022-08-10 DIAGNOSIS — R1084 Generalized abdominal pain: Secondary | ICD-10-CM | POA: Insufficient documentation

## 2022-08-10 DIAGNOSIS — I509 Heart failure, unspecified: Secondary | ICD-10-CM | POA: Insufficient documentation

## 2022-08-10 DIAGNOSIS — I132 Hypertensive heart and chronic kidney disease with heart failure and with stage 5 chronic kidney disease, or end stage renal disease: Secondary | ICD-10-CM | POA: Insufficient documentation

## 2022-08-10 DIAGNOSIS — R42 Dizziness and giddiness: Secondary | ICD-10-CM | POA: Insufficient documentation

## 2022-08-10 DIAGNOSIS — Z8551 Personal history of malignant neoplasm of bladder: Secondary | ICD-10-CM | POA: Insufficient documentation

## 2022-08-10 DIAGNOSIS — I5022 Chronic systolic (congestive) heart failure: Secondary | ICD-10-CM | POA: Insufficient documentation

## 2022-08-10 DIAGNOSIS — N186 End stage renal disease: Secondary | ICD-10-CM | POA: Insufficient documentation

## 2022-08-10 DIAGNOSIS — Z951 Presence of aortocoronary bypass graft: Secondary | ICD-10-CM | POA: Diagnosis not present

## 2022-08-10 DIAGNOSIS — I959 Hypotension, unspecified: Secondary | ICD-10-CM | POA: Diagnosis present

## 2022-08-10 DIAGNOSIS — Z8616 Personal history of COVID-19: Secondary | ICD-10-CM | POA: Diagnosis not present

## 2022-08-10 DIAGNOSIS — N390 Urinary tract infection, site not specified: Secondary | ICD-10-CM | POA: Insufficient documentation

## 2022-08-10 DIAGNOSIS — I2510 Atherosclerotic heart disease of native coronary artery without angina pectoris: Secondary | ICD-10-CM | POA: Diagnosis not present

## 2022-08-10 DIAGNOSIS — Z992 Dependence on renal dialysis: Secondary | ICD-10-CM | POA: Diagnosis not present

## 2022-08-10 LAB — URINALYSIS, ROUTINE W REFLEX MICROSCOPIC
Bilirubin Urine: NEGATIVE
Glucose, UA: NEGATIVE mg/dL
Ketones, ur: NEGATIVE mg/dL
Nitrite: NEGATIVE
Protein, ur: >=300 mg/dL — AB
RBC / HPF: >50 RBC/hpf (ref 0–5)
Specific Gravity, Urine: 1.017 (ref 1.005–1.030)
WBC, UA: >50 WBC/hpf (ref 0–5)
pH: 7 (ref 5.0–8.0)

## 2022-08-10 LAB — COMPREHENSIVE METABOLIC PANEL
ALT: 13 U/L (ref 0–44)
AST: 17 U/L (ref 15–41)
Albumin: 3.6 g/dL (ref 3.5–5.0)
Alkaline Phosphatase: 56 U/L (ref 38–126)
Anion gap: 17 — ABNORMAL HIGH (ref 5–15)
BUN: 23 mg/dL (ref 8–23)
CO2: 17 mmol/L — ABNORMAL LOW (ref 22–32)
Calcium: 7.7 mg/dL — ABNORMAL LOW (ref 8.9–10.3)
Chloride: 104 mmol/L (ref 98–111)
Creatinine, Ser: 5.42 mg/dL — ABNORMAL HIGH (ref 0.61–1.24)
GFR, Estimated: 11 mL/min — ABNORMAL LOW (ref >60)
Glucose, Bld: 99 mg/dL (ref 70–99)
Potassium: 4.3 mmol/L (ref 3.5–5.1)
Sodium: 138 mmol/L (ref 135–145)
Total Bilirubin: 0.5 mg/dL (ref 0.3–1.2)
Total Protein: 6.9 g/dL (ref 6.5–8.1)

## 2022-08-10 LAB — CBC WITH DIFFERENTIAL/PLATELET
Abs Immature Granulocytes: 0.04 10*3/uL (ref 0.00–0.07)
Basophils Absolute: 0 10*3/uL (ref 0.0–0.1)
Basophils Relative: 0 %
Eosinophils Absolute: 0.2 10*3/uL (ref 0.0–0.5)
Eosinophils Relative: 2 %
HCT: 43.9 % (ref 39.0–52.0)
Hemoglobin: 13.4 g/dL (ref 13.0–17.0)
Immature Granulocytes: 0 %
Lymphocytes Relative: 9 %
Lymphs Abs: 0.8 10*3/uL (ref 0.7–4.0)
MCH: 28.8 pg (ref 26.0–34.0)
MCHC: 30.5 g/dL (ref 30.0–36.0)
MCV: 94.2 fL (ref 80.0–100.0)
Monocytes Absolute: 1 10*3/uL (ref 0.1–1.0)
Monocytes Relative: 10 %
Neutro Abs: 7.5 10*3/uL (ref 1.7–7.7)
Neutrophils Relative %: 79 %
Platelets: 246 10*3/uL (ref 150–400)
RBC: 4.66 MIL/uL (ref 4.22–5.81)
RDW: 15.7 % — ABNORMAL HIGH (ref 11.5–15.5)
WBC: 9.6 10*3/uL (ref 4.0–10.5)
nRBC: 0 % (ref 0.0–0.2)

## 2022-08-10 LAB — LIPASE, BLOOD: Lipase: 31 U/L (ref 11–51)

## 2022-08-10 LAB — TROPONIN I (HIGH SENSITIVITY)
Troponin I (High Sensitivity): 43 ng/L — ABNORMAL HIGH (ref <18)
Troponin I (High Sensitivity): 44 ng/L — ABNORMAL HIGH (ref <18)

## 2022-08-10 LAB — PROCALCITONIN: Procalcitonin: 0.64 ng/mL

## 2022-08-10 LAB — LACTIC ACID, PLASMA: Lactic Acid, Venous: 1.8 mmol/L (ref 0.5–1.9)

## 2022-08-10 MED ORDER — CEFPODOXIME PROXETIL 200 MG PO TABS: 200.0000 mg | ORAL_TABLET | Freq: Every day | ORAL | 0 refills | Status: DC

## 2022-08-10 MED ORDER — LACTATED RINGERS IV BOLUS
500.0000 mL | Freq: Once | INTRAVENOUS | Status: AC
  Administered 2022-08-10: 500 mL via INTRAVENOUS

## 2022-08-10 MED ORDER — SODIUM CHLORIDE 0.9 % IV SOLN
1.0000 g | Freq: Once | INTRAVENOUS | Status: AC
  Administered 2022-08-10: 1 g via INTRAVENOUS
  Filled 2022-08-10: qty 10

## 2022-08-10 MED ORDER — IOHEXOL 300 MG/ML  SOLN
100.0000 mL | Freq: Once | INTRAMUSCULAR | Status: AC | PRN
  Administered 2022-08-10: 100 mL via INTRAVENOUS

## 2022-08-10 NOTE — ED Provider Notes (Signed)
[facility] Provider Note    Event Date/Time   First MD Initiated Contact with Patient 08/10/22 1632     (approximate)   History   Hypotension   HPI  Anthony Hess is a 67 y.o. male past medical history of coronary disease, CHF last EF 55 to 60% on 03/2022, end-stage renal disease on dialysis who presents with lightheadedness and abdominal pain.  Patient tells me that all week he has been having some generalized abdominal discomfort.  It is a constant pain but he still eating and drinking no vomiting or diarrhea last BM was yesterday.  Today he has felt lightheaded especially with standing.  When EMS arrived blood pressure was in the 50s.  He received 2 500 cc boluses with EMS.  He went to dialysis today says he had a full session they took fluid off of him but his blood pressure was not low during dialysis.  He denies chest pain or dyspnea denies fevers or chills     Past Medical History:  Diagnosis Date   Aortic atherosclerosis (HCC)    Bilateral carotid artery disease (HCC)    Bladder cancer (HCC)    Coronary artery disease 12/20/2018   a.) LHC 12/20/2018: 50% OM1, 40% OM2, 95% o-pLAD, 75% o=pLCx, 40% mLM, 70% D1, 60% mRCA-1, 50% mRCA-2; refer to CVTS. b.) 4v CABG at [facility] on 12/27/2018: LIMA-LAD, RIMA-PDA, seg LRA-OM1-D1   DCM (dilated cardiomyopathy) (HCC) 12/05/2018   a.) TTE 12/05/2018: EF 40-45%. b.) TTE 12/28/2019: EF 20-25%.   ESRD (end stage renal disease) (HCC)    a.) T-Th-Sat   HFrEF (heart failure with reduced ejection fraction) (HCC) 12/05/2018   a.) TTE 12/05/2018: EF 40-45%; mild LVH; ant/apical/sep HK; mild TR . b.) TTE 12/28/2019: EF 20-25%; mod LVH; mod MR/AR; G1DD.   History of 2019 novel coronavirus disease (COVID-19) 04/08/2021   History of kidney stones    HLD (hyperlipidemia)    Hx of CABG 12/27/2018   Hypertension    Infrarenal abdominal aortic aneurysm (AAA) without rupture (HCC) 03/05/2021   a.) CT abd/pelvis; measured 3.2 cm.    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   a.) LIMA-LAD, RIMA-PDA, sequential LEFT radial artery to OM1 and D1   Sepsis (HCC) 03/14/2021   Wears glasses     Patient Active Problem List   Diagnosis Date Noted   Seasonal allergic rhinitis due to pollen 06/25/2022   Nausea 06/25/2022   Hoarseness of voice 06/25/2022   Establishing care with new doctor, encounter for 06/25/2022   S/P exploratory laparotomy for perforated duodenum with admission 04/26/22 05/04/2022   Postprocedural intraabdominal abscess 05/04/2022   Acute pulmonary embolism (HCC) 04/19/2022   Dyslipidemia 04/19/2022   End-stage renal disease on hemodialysis (HCC) 04/19/2022   Tobacco dependence 04/19/2022   Chronic systolic heart failure (HCC) 04/05/2022   Chronic back pain 11/16/2021   Hypertension secondary to other renal disorders 10/15/2021   Vitamin B12 deficiency anemia due to intrinsic factor deficiency 06/23/2021   Abdominal pain    SBP (spontaneous bacterial peritonitis) (HCC) 04/08/2021   COVID-19 virus infection 04/08/2021   AAA (abdominal aortic aneurysm) (HCC) 03/18/2021   Spontaneous bacterial peritonitis (HCC) 03/04/2021   Incisional hernia, without obstruction or gangrene    Generalized (acute) peritonitis (HCC) 12/10/2020   Iron deficiency anemia, unspecified 05/06/2020  Moderate protein-calorie malnutrition (HCC) 05/06/2020   Other long term (current) drug therapy 05/06/2020   Unspecified jaundice 05/06/2020   PD catheter dysfunction (HCC) 04/13/2020   Bilateral carotid artery stenosis 02/29/2020   Coronary artery disease involving coronary bypass graft of native heart 02/29/2020   Secondary hyperparathyroidism of renal origin (HCC) 01/30/2020   Acute peritonitis (HCC) 01/08/2020   Hyperlipidemia, mixed 01/04/2020    Mass of left side of neck 01/04/2020   Senile purpura (HCC) 01/04/2020   Hydroureteronephrosis    Atrial fibrillation (HCC) 01/05/2019   End stage renal disease (HCC) 01/05/2019   Anemia in chronic kidney disease (CODE) 01/05/2019   Presence of aortocoronary bypass graft 12/27/2018   Other emphysema (HCC) 04/14/2018   Bilateral hydronephrosis 04/03/2018   Hypertension 03/27/2018   Cigarette smoker 03/20/2018   History of bladder cancer 03/20/2018     Physical Exam  Triage Vital Signs: ED Triage Vitals [08/10/22 1632]  Enc Vitals Group     BP (!) 75/65     Pulse Rate 76     Resp 16     Temp 97.9 F (36.6 C)     Temp Source Oral     SpO2 100 %     Weight 113 lb 15.7 oz (51.7 kg)     Height 5\' 3"  (1.6 m)     Head Circumference      Peak Flow      Pain Score 5     Pain Loc      Pain Edu?      Excl. in GC?     Most recent vital signs: Vitals:   08/10/22 2050 08/10/22 2059  BP: 100/73   Pulse: 73   Resp: 15   Temp:  98 F (36.7 C)  SpO2: 93%      General: Awake, no distress.  Patient is thin, chronically ill-appearing CV:  Good peripheral perfusion.  No peripheral edema Resp:  Normal effort.  Abd:  No distention. Abdominal surgical scar, abdomen is mildly tender periumbilically but it is soft no guarding Neuro:             Awake, Alert, Oriented x 3  Other:  Hemodialysis catheter in the right chest with no surrounding erythema  ED Results / Procedures / Treatments  Labs (all labs ordered are listed, but only abnormal results are displayed) Labs Reviewed  COMPREHENSIVE METABOLIC PANEL - Abnormal; Notable for the following components:      Result Value   CO2 17 (*)    Creatinine, Ser 5.42 (*)    Calcium 7.7 (*)    GFR, Estimated 11 (*)    Anion gap 17 (*)    All other components within normal limits  CBC WITH DIFFERENTIAL/PLATELET - Abnormal; Notable for the following components:   RDW 15.7 (*)    All other components within normal limits  URINALYSIS,  ROUTINE W REFLEX MICROSCOPIC - Abnormal; Notable for the following components:   Color, Urine YELLOW (*)    APPearance TURBID (*)    Hgb urine dipstick SMALL (*)    Protein, ur >=300 (*)    Leukocytes,Ua MODERATE (*)    Bacteria, UA RARE (*)    All other components within normal limits  TROPONIN I (HIGH SENSITIVITY) - Abnormal; Notable for the following components:   Troponin I (High Sensitivity) 43 (*)    All other components within normal limits  TROPONIN I (HIGH SENSITIVITY) - Abnormal; Notable for the following components:   Troponin I (High Sensitivity) 44 (*)  All other components within normal limits  URINE CULTURE  LIPASE, BLOOD  LACTIC ACID, PLASMA  PROCALCITONIN  LACTIC ACID, PLASMA     EKG  Reviewed and interpreted patient's EKG which shows sinus rhythm with a right bundle branch block LVH with repolarization abnormality diffuse ST depression and biphasic T waves in multiple leads   RADIOLOGY I reviewed and interpreted the CXR which does not show any acute cardiopulmonary process    PROCEDURES:  Critical Care performed: Yes, see critical care procedure note(s)  .Critical Care  Performed by: Georga Hacking, MD Authorized by: Georga Hacking, MD   Critical care provider statement:    Critical care time (minutes):  30   Critical care was time spent personally by me on the following activities:  Development of treatment plan with patient or surrogate, discussions with consultants, evaluation of patient's response to treatment, examination of patient, ordering and review of laboratory studies, ordering and review of radiographic studies, ordering and performing treatments and interventions, pulse oximetry, re-evaluation of patient's condition and review of old charts   The patient is on the cardiac monitor to evaluate for evidence of arrhythmia and/or significant heart rate changes.   MEDICATIONS ORDERED IN ED: Medications  cefTRIAXone (ROCEPHIN) 1 g  in sodium chloride 0.9 % 100 mL IVPB (1 g Intravenous New Bag/Given 08/10/22 2059)  lactated ringers bolus 500 mL (0 mLs Intravenous Stopped 08/10/22 1833)  iohexol (OMNIPAQUE) 300 MG/ML solution 100 mL (100 mLs Intravenous Contrast Given 08/10/22 1712)     IMPRESSION / MDM / ASSESSMENT AND PLAN / ED COURSE  I reviewed the triage vital signs and the nursing notes.                              Patient's presentation is most consistent with acute presentation with potential threat to life or bodily function.  Differential diagnosis includes, but is not limited to, hypovolemia, anemia, cardiogenic shock, cardiac tamponade, dialysis disequilibrium syndrome, ruptured AAA, dissection, sepsis  The patient is a 67 year old male with multiple medical comorbidities including end-stage renal disease on dialysis who presents with hypotension.  He has been feeling lightheaded all day did go to dialysis but blood pressure apparently was not significantly abnormal.  He also has had some mild abdominal discomfort for about a week but no vomiting or diarrhea or GI losses.  Does still make some urine and is not having dysuria fevers or chills.  Initial BP in the ED is 75/65.  Patient has already received a liter with EMS I did perform bedside ultrasound and his IVC is collapsible with just minimal  B lines so I did opt to give patient another 500 cc bolus.  I did also look at the patient's aorta which does not look to be significantly aneurysmal given concern for possible AAA in the setting of abdominal pain and hypertension.  There is also no evidence of cardiac tamponade.  Patient's EKG is abnormal with right bundle and LVH with significant repolarization abnormality but does appear similar in morphology to prior EKG although the ST depressions are  more pronounced today.  Plan to obtain labs and obtain a CT of the abdomen pelvis as well as chest x-ray.  CT of the abdomen pelvis is reassuring.  They do mention  bladder wall thickening.  Chest x-ray is clear.  His labs are overall reassuring he has no leukocytosis procalcitonin just mildly elevated at 0.6.  Lactate is negative.  Does have an anion gap of 17, but his lactate is negative has no ketones in his urine and glucose is normal.  BUN not significantly elevated so somewhat unclear what the etiology is.  Takes a baby aspirin but denies any other salicylate intake.  We did obtain in and out UA as patient did not feel that he was going to be able to provide a urine sample typically goes only once daily.  Does have greater than 50 white cells and red cells with rare bacteria.  Given patient is having some lower abdominal discomfort there is bladder wall thickening on CT will treat as cystitis.  This could just be related to his end-stage renal disease and colonization but would prefer to treat.  Patient was given a dose of ceftriaxone in the ED.  Will give cefpodoxime renally dosed.  Patient is feeling improved he is eating he has been normotensive after fluid bolus.  With his otherwise reassuring labs and workup and preference to go home I will discharge him.  Discussed tricked return precautions for fever or worsening abdominal pain or ongoing lightheadedness.       FINAL CLINICAL IMPRESSION(S) / ED DIAGNOSES   Final diagnoses:  Hypotension, unspecified hypotension type  Urinary tract infection without hematuria, site unspecified     Rx / DC Orders   ED Discharge Orders          Ordered    cefpodoxime (VANTIN) 200 MG tablet  Daily,   Status:  Discontinued        08/10/22 2103    cefpodoxime (VANTIN) 200 MG tablet  Daily        08/10/22 2104             Note:  This document was prepared using Dragon voice recognition software and may include unintentional dictation errors.   Georga Hacking, MD 08/10/22 2107

## 2022-08-10 NOTE — ED Triage Notes (Signed)
Pt here from home via [facility] with hypotension. Pt went to dialysis today, unsure of how much fluid was removed. Pt initial bp was 59/43 coming up the 70's systolic. Pt had some abd pain and nausea this morning but states it has improved since receiving fluids with ems. Pt takes bp meds but did not take them today.

## 2022-08-10 NOTE — Discharge Instructions (Addendum)
Your blood pressure was low when you arrived but it is come up after fluids.  You may have a urinary tract infection.  Please take the antibiotic twice a day for the next 7 days.  On your dialysis days please take the antibiotic AFTER dialysis.   If you develop fevers worsening lightheadedness or worsening abdominal pain please return the emergency department.

## 2022-08-11 LAB — URINE CULTURE: Culture: NO GROWTH

## 2022-08-17 ENCOUNTER — Observation Stay
Admission: EM | Admit: 2022-08-17 | Discharge: 2022-08-18 | Disposition: A | Discharge status: Home / Self Care | Payer: Medicare Other | Attending: Internal Medicine | Admitting: Internal Medicine

## 2022-08-17 ENCOUNTER — Encounter: Payer: Self-pay | Admitting: Emergency Medicine

## 2022-08-17 ENCOUNTER — Emergency Department: Payer: Medicare Other

## 2022-08-17 ENCOUNTER — Other Ambulatory Visit: Payer: Self-pay

## 2022-08-17 DIAGNOSIS — R079 Chest pain, unspecified: Secondary | ICD-10-CM | POA: Diagnosis not present

## 2022-08-17 DIAGNOSIS — F17210 Nicotine dependence, cigarettes, uncomplicated: Secondary | ICD-10-CM | POA: Diagnosis not present

## 2022-08-17 DIAGNOSIS — I2510 Atherosclerotic heart disease of native coronary artery without angina pectoris: Secondary | ICD-10-CM | POA: Insufficient documentation

## 2022-08-17 DIAGNOSIS — E782 Mixed hyperlipidemia: Secondary | ICD-10-CM | POA: Diagnosis not present

## 2022-08-17 DIAGNOSIS — Z7901 Long term (current) use of anticoagulants: Secondary | ICD-10-CM | POA: Insufficient documentation

## 2022-08-17 DIAGNOSIS — R7989 Other specified abnormal findings of blood chemistry: Secondary | ICD-10-CM | POA: Insufficient documentation

## 2022-08-17 DIAGNOSIS — Z992 Dependence on renal dialysis: Secondary | ICD-10-CM | POA: Diagnosis not present

## 2022-08-17 DIAGNOSIS — I4891 Unspecified atrial fibrillation: Secondary | ICD-10-CM | POA: Diagnosis not present

## 2022-08-17 DIAGNOSIS — I2699 Other pulmonary embolism without acute cor pulmonale: Secondary | ICD-10-CM | POA: Insufficient documentation

## 2022-08-17 DIAGNOSIS — R0789 Other chest pain: Secondary | ICD-10-CM | POA: Diagnosis present

## 2022-08-17 DIAGNOSIS — Z951 Presence of aortocoronary bypass graft: Secondary | ICD-10-CM | POA: Diagnosis not present

## 2022-08-17 DIAGNOSIS — Z8551 Personal history of malignant neoplasm of bladder: Secondary | ICD-10-CM | POA: Insufficient documentation

## 2022-08-17 DIAGNOSIS — I5022 Chronic systolic (congestive) heart failure: Secondary | ICD-10-CM | POA: Diagnosis present

## 2022-08-17 DIAGNOSIS — I5030 Unspecified diastolic (congestive) heart failure: Secondary | ICD-10-CM | POA: Insufficient documentation

## 2022-08-17 DIAGNOSIS — I10 Essential (primary) hypertension: Secondary | ICD-10-CM

## 2022-08-17 DIAGNOSIS — I160 Hypertensive urgency: Secondary | ICD-10-CM | POA: Diagnosis not present

## 2022-08-17 DIAGNOSIS — I5042 Chronic combined systolic (congestive) and diastolic (congestive) heart failure: Secondary | ICD-10-CM | POA: Insufficient documentation

## 2022-08-17 DIAGNOSIS — I214 Non-ST elevation (NSTEMI) myocardial infarction: Secondary | ICD-10-CM | POA: Diagnosis not present

## 2022-08-17 DIAGNOSIS — D631 Anemia in chronic kidney disease: Secondary | ICD-10-CM | POA: Insufficient documentation

## 2022-08-17 DIAGNOSIS — I132 Hypertensive heart and chronic kidney disease with heart failure and with stage 5 chronic kidney disease, or end stage renal disease: Secondary | ICD-10-CM | POA: Insufficient documentation

## 2022-08-17 DIAGNOSIS — N186 End stage renal disease: Secondary | ICD-10-CM | POA: Insufficient documentation

## 2022-08-17 DIAGNOSIS — I482 Chronic atrial fibrillation, unspecified: Secondary | ICD-10-CM | POA: Diagnosis present

## 2022-08-17 DIAGNOSIS — Z7982 Long term (current) use of aspirin: Secondary | ICD-10-CM | POA: Insufficient documentation

## 2022-08-17 DIAGNOSIS — Z8616 Personal history of COVID-19: Secondary | ICD-10-CM | POA: Diagnosis not present

## 2022-08-17 LAB — BASIC METABOLIC PANEL
Anion gap: 14 (ref 5–15)
BUN: 55 mg/dL — ABNORMAL HIGH (ref 8–23)
CO2: 19 mmol/L — ABNORMAL LOW (ref 22–32)
Calcium: 7.4 mg/dL — ABNORMAL LOW (ref 8.9–10.3)
Chloride: 104 mmol/L (ref 98–111)
Creatinine, Ser: 8.02 mg/dL — ABNORMAL HIGH (ref 0.61–1.24)
GFR, Estimated: 7 mL/min — ABNORMAL LOW (ref >60)
Glucose, Bld: 79 mg/dL (ref 70–99)
Potassium: 4.8 mmol/L (ref 3.5–5.1)
Sodium: 137 mmol/L (ref 135–145)

## 2022-08-17 LAB — CBC
HCT: 42.1 % (ref 39.0–52.0)
Hemoglobin: 12.9 g/dL — ABNORMAL LOW (ref 13.0–17.0)
MCH: 28.9 pg (ref 26.0–34.0)
MCHC: 30.6 g/dL (ref 30.0–36.0)
MCV: 94.2 fL (ref 80.0–100.0)
Platelets: 267 10*3/uL (ref 150–400)
RBC: 4.47 MIL/uL (ref 4.22–5.81)
RDW: 15.6 % — ABNORMAL HIGH (ref 11.5–15.5)
WBC: 13.5 10*3/uL — ABNORMAL HIGH (ref 4.0–10.5)
nRBC: 0 % (ref 0.0–0.2)

## 2022-08-17 LAB — TROPONIN I (HIGH SENSITIVITY)
Troponin I (High Sensitivity): 138 ng/L (ref <18)
Troponin I (High Sensitivity): 163 ng/L (ref <18)
Troponin I (High Sensitivity): 123 ng/L (ref <18)

## 2022-08-17 LAB — HEPATITIS B SURFACE ANTIGEN: Hepatitis B Surface Ag: NONREACTIVE

## 2022-08-17 MED ORDER — ASPIRIN 81 MG PO CHEW
81.0000 mg | CHEWABLE_TABLET | Freq: Every day | ORAL | Status: DC
  Administered 2022-08-18: 81 mg via ORAL
  Filled 2022-08-17: qty 1

## 2022-08-17 MED ORDER — ASPIRIN 81 MG PO CHEW
324.0000 mg | CHEWABLE_TABLET | Freq: Once | ORAL | Status: DC
  Filled 2022-08-17: qty 4

## 2022-08-17 MED ORDER — LABETALOL HCL 5 MG/ML IV SOLN
10.0000 mg | Freq: Once | INTRAVENOUS | Status: AC
  Administered 2022-08-17: 10 mg via INTRAVENOUS
  Filled 2022-08-17: qty 4

## 2022-08-17 MED ORDER — HEPARIN SODIUM (PORCINE) 5000 UNIT/ML IJ SOLN: 5000.0000 [IU] | Freq: Two times a day (BID) | INTRAMUSCULAR | Status: DC

## 2022-08-17 MED ORDER — HYDRALAZINE HCL 25 MG PO TABS
25.0000 mg | ORAL_TABLET | Freq: Three times a day (TID) | ORAL | Status: DC
  Administered 2022-08-17 – 2022-08-18 (×2): 25 mg via ORAL
  Filled 2022-08-17 (×2): qty 1

## 2022-08-17 MED ORDER — RENA-VITE PO TABS
1.0000 | ORAL_TABLET | Freq: Every day | ORAL | Status: DC
  Administered 2022-08-17: 1 via ORAL
  Filled 2022-08-17 (×2): qty 1

## 2022-08-17 MED ORDER — SODIUM CHLORIDE 0.9% FLUSH
3.0000 mL | Freq: Two times a day (BID) | INTRAVENOUS | Status: DC
  Administered 2022-08-17 – 2022-08-18 (×2): 3 mL via INTRAVENOUS

## 2022-08-17 MED ORDER — APIXABAN 5 MG PO TABS: 5.0000 mg | ORAL_TABLET | Freq: Two times a day (BID) | ORAL | Status: DC

## 2022-08-17 MED ORDER — ISOSORBIDE DINITRATE 10 MG PO TABS
10.0000 mg | ORAL_TABLET | Freq: Three times a day (TID) | ORAL | Status: DC
  Administered 2022-08-17 – 2022-08-18 (×2): 10 mg via ORAL
  Filled 2022-08-17 (×4): qty 1

## 2022-08-17 MED ORDER — APIXABAN 2.5 MG PO TABS
2.5000 mg | ORAL_TABLET | Freq: Two times a day (BID) | ORAL | Status: DC
  Administered 2022-08-17 – 2022-08-18 (×2): 2.5 mg via ORAL
  Filled 2022-08-17 (×2): qty 1

## 2022-08-17 MED ORDER — SODIUM CHLORIDE 0.9 % IV SOLN: 250.0000 mL | INTRAVENOUS | Status: DC | PRN

## 2022-08-17 MED ORDER — LABETALOL HCL 5 MG/ML IV SOLN
20.0000 mg | Freq: Once | INTRAVENOUS | Status: AC
  Administered 2022-08-17: 20 mg via INTRAVENOUS
  Filled 2022-08-17: qty 4

## 2022-08-17 MED ORDER — NITROGLYCERIN 0.4 MG SL SUBL
0.4000 mg | SUBLINGUAL_TABLET | Freq: Once | SUBLINGUAL | Status: AC
  Administered 2022-08-17: 0.4 mg via SUBLINGUAL
  Filled 2022-08-17: qty 1

## 2022-08-17 MED ORDER — SUCROFERRIC OXYHYDROXIDE 500 MG PO CHEW
500.0000 mg | CHEWABLE_TABLET | Freq: Three times a day (TID) | ORAL | Status: DC
  Filled 2022-08-17: qty 1

## 2022-08-17 MED ORDER — NITROGLYCERIN 2 % TD OINT
1.0000 [in_us] | TOPICAL_OINTMENT | Freq: Once | TRANSDERMAL | Status: AC
  Administered 2022-08-17: 1 [in_us] via TOPICAL
  Filled 2022-08-17: qty 1

## 2022-08-17 MED ORDER — CHLORHEXIDINE GLUCONATE CLOTH 2 % EX PADS
6.0000 | MEDICATED_PAD | Freq: Every day | CUTANEOUS | Status: DC
  Administered 2022-08-18: 6 via TOPICAL
  Filled 2022-08-17: qty 6

## 2022-08-17 MED ORDER — AMLODIPINE BESYLATE 5 MG PO TABS
2.5000 mg | ORAL_TABLET | Freq: Every day | ORAL | Status: DC
  Administered 2022-08-18: 2.5 mg via ORAL
  Filled 2022-08-17 (×2): qty 1

## 2022-08-17 MED ORDER — SODIUM CHLORIDE 0.9% FLUSH: 3.0000 mL | INTRAVENOUS | Status: DC | PRN

## 2022-08-17 MED ORDER — SUCROFERRIC OXYHYDROXIDE 500 MG PO CHEW
1000.0000 mg | CHEWABLE_TABLET | Freq: Three times a day (TID) | ORAL | Status: DC
  Administered 2022-08-17 – 2022-08-18 (×3): 1000 mg via ORAL
  Filled 2022-08-17 (×4): qty 2

## 2022-08-17 MED ORDER — LABETALOL HCL 5 MG/ML IV SOLN: 10.0000 mg | Freq: Once | INTRAVENOUS | Status: DC

## 2022-08-17 MED ORDER — ACETAMINOPHEN 325 MG PO TABS: 650.0000 mg | ORAL_TABLET | Freq: Four times a day (QID) | ORAL | Status: DC | PRN

## 2022-08-17 MED ORDER — ATORVASTATIN CALCIUM 20 MG PO TABS
40.0000 mg | ORAL_TABLET | Freq: Every evening | ORAL | Status: DC
  Administered 2022-08-17: 40 mg via ORAL
  Filled 2022-08-17: qty 2

## 2022-08-17 MED ORDER — METOPROLOL SUCCINATE ER 25 MG PO TB24
25.0000 mg | ORAL_TABLET | Freq: Every day | ORAL | Status: DC
  Administered 2022-08-18: 25 mg via ORAL
  Filled 2022-08-17 (×2): qty 1

## 2022-08-17 NOTE — ED Triage Notes (Signed)
Patient to ED via [facility] from dialysis- centralized chest pain with cough since last PM. Patient only received 20 minutes of dialysis today. Hx of MI. Given clonidine at dialysis.

## 2022-08-17 NOTE — Assessment & Plan Note (Signed)
Hemoglobin 12.9 today which appears near baseline Noted prior history of GI bleeding No reported active bleeding Monitor

## 2022-08-17 NOTE — ED Notes (Signed)
Pt signed IC for dialysis and going now on dinamap monitor.

## 2022-08-17 NOTE — ED Notes (Signed)
Pt would like to go home, he is requesting to leave.  MD contacted, await call back

## 2022-08-17 NOTE — ED Notes (Addendum)
Pt had informed nursing staff that he planned to leave as he was feeling fine and wanted to go home.  Pt became very insistent and states that he will leave now.  He was ambulatory and in no distress.  AMA explained to him and he verbally acknowledged and signed AMA.  IV's removed and pt left ama.  Dr. Para March notified bu direct message

## 2022-08-17 NOTE — ED Notes (Signed)
Pt was given a dinner tray.  

## 2022-08-17 NOTE — Assessment & Plan Note (Signed)
Intermittent atypical chest pain with past 24 hours troponin in the 130s Prior troponin trend associated with chest pain has peaked from 400s to 600s Baseline ESRD, CAD status post CABG, hypertension Blood pressure 160s to 190s over 100s Suspect demand ischemia in the setting of volume overload with partial hemodialysis today, elevated BP EKG stable as compared to the prior Will trend troponin Will consult cardiology nonurgently Continue home regimen

## 2022-08-17 NOTE — ED Notes (Signed)
Pt returns to STAT desk st he changed his mind about leaving....sore throat "I was feeling better, but now I'm hurting again"; charge nurse notified and will take pt back to his original room 32

## 2022-08-17 NOTE — Assessment & Plan Note (Signed)
Blood pressure 160s to 180s over 110s Continue home oral regimen including Norvasc, hydralazine, metoprolol As needed labetalol Follow blood pressure with hemodialysis and volume status

## 2022-08-17 NOTE — ED Notes (Signed)
Pt ambulatory to STAT desk inquiring over getting a "medicaid taxi cab"; explained to pt that taxi is not running at this time

## 2022-08-17 NOTE — Progress Notes (Signed)
[facility]  ROUNDING NOTE   Subjective:   [name] was admitted to [facility] on 08/17/2022 for NSTEMI (non-ST elevated myocardial infarction) (HCC) [I21.4] Hypertension, unspecified type [I10]  Last hemodialysis treatment was today where he received 1 hour of treatment before having chest pain and hypertensive urgency.   Patient brought to [facility] ED. NTG and labetalol given.    Objective:  Vital signs in last 24 hours:  Temp:  [97.6 F (36.4 C)-98.5 F (36.9 C)] 97.6 F (36.4 C) (06/25 1337) Pulse Rate:  [29-122] 59 (06/25 1430) Resp:  [16-22] 16 (06/25 1430) BP: (116-194)/(89-122) 118/89 (06/25 1430) SpO2:  [17 %-97 %] 92 % (06/25 1430) Weight:  [62.1 kg] 62.1 kg (06/25 0829)  Weight change:  Filed Weights   08/17/22 0829  Weight: 62.1 kg    Intake/Output: No intake/output data recorded.   Intake/Output this shift:  No intake/output data recorded.  Physical Exam: General: NAD, in stretcher.   Head: Normocephalic, atraumatic. Moist oral mucosal membranes  Eyes: Anicteric, PERRL  Neck: Supple, trachea midline  Lungs:  Clear to auscultation  Heart: Regular rate and rhythm  Abdomen:  Soft, nontender,   Extremities:  no peripheral edema.  Neurologic: Nonfocal, moving all four extremities  Skin: No lesions  Access: RIj permcath    Basic Metabolic Panel: Recent Labs  Lab 08/10/22 1639 08/17/22 0832  NA 138 137  K 4.3 4.8  CL 104 104  CO2 17* 19*  GLUCOSE 99 79  BUN 23 55*  CREATININE 5.42* 8.02*  CALCIUM 7.7* 7.4*    Liver Function Tests: Recent Labs  Lab 08/10/22 1639  AST 17  ALT 13  ALKPHOS 56  BILITOT 0.5  PROT 6.9  ALBUMIN 3.6   Recent Labs  Lab 08/10/22 1639  LIPASE 31   No results for input(s): "AMMONIA" in the last 168 hours.  CBC: Recent Labs  Lab 08/10/22 1639 08/17/22 0832  WBC 9.6 13.5*  NEUTROABS 7.5  --   HGB 13.4 12.9*  HCT 43.9 42.1  MCV 94.2 94.2  PLT 246 267    Cardiac Enzymes: No results for  input(s): "CKTOTAL", "CKMB", "CKMBINDEX", "TROPONINI" in the last 168 hours.  BNP: Invalid input(s): "POCBNP"  CBG: No results for input(s): "GLUCAP" in the last 168 hours.  Microbiology: Results for orders placed or performed during the hospital encounter of 08/10/22  Urine Culture     Status: None   Collection Time: 08/10/22  4:29 PM   Specimen: Urine, Clean Catch  Result Value Ref Range Status   Specimen Description   Final    URINE, CLEAN CATCH Performed at [facility], [address], [place], [place] [address]    Special Requests   Final    NONE Performed at [facility], [address] [place], [place] [address]    Culture   Final    NO GROWTH Performed at [facility], [address] [place], [place] [address]    Report Status 08/11/2022 FINAL  Final    Coagulation Studies: No results for input(s): "LABPROT", "INR" in the last 72 hours.  Urinalysis: No results for input(s): "COLORURINE", "LABSPEC", "PHURINE", "GLUCOSEU", "HGBUR", "BILIRUBINUR", "KETONESUR", "PROTEINUR", "UROBILINOGEN", "NITRITE", "LEUKOCYTESUR" in the last 72 hours.  Invalid input(s): "APPERANCEUR"    Imaging: DG Chest 2 View  Result Date: 08/17/2022 CLINICAL DATA:  Chest pain, dialysis patient EXAM: CHEST - 2 VIEW COMPARISON:  08/10/2022 chest radiograph. FINDINGS: Right internal jugular dialysis catheter terminates over the right atrium.  Intact sternotomy wires. Stable cardiomediastinal silhouette with normal heart size. No pneumothorax. No pleural effusion. Lungs appear clear, with no acute consolidative airspace disease and no pulmonary edema. IMPRESSION: No active cardiopulmonary disease. Electronically Signed   By: Delbert Phenix M.D.   On: 08/17/2022 09:04     Medications:    sodium chloride      amLODipine  2.5 mg Oral Daily   apixaban  2.5 mg Oral BID   aspirin  324 mg Oral Once   [START ON 08/18/2022] aspirin  81 mg Oral Daily   atorvastatin  40 mg Oral  QPM   Chlorhexidine Gluconate Cloth  6 each Topical Q0600   hydrALAZINE  25 mg Oral TID   isosorbide dinitrate  10 mg Oral TID   metoprolol succinate  25 mg Oral Daily   multivitamin  1 tablet Oral QHS   sodium chloride flush  3 mL Intravenous Q12H   sucroferric oxyhydroxide  1,000 mg Oral TID WC   sodium chloride, acetaminophen, sodium chloride flush  Assessment/ Plan:  [name] is a 67 y.o.  male with end stage renal disease on hemodialysis, hypertension, coronary artery disease status post CABG, AAA, carotid artery disease, hyperlipidemia, congestive heart failure, and history of bladder cancer who presents to [facility] on 08/17/2022 for NSTEMI (non-ST elevated myocardial infarction) (HCC) [I21.4] Hypertension, unspecified type [I10]  [facility] TTS [facility] RIJ permcath 53kg  End Stage Renal Disease: place on hemodialysis to complete TTS schedule.   Hypertension with chronic kidney disease: with hypertensive urgency on admission. Regimen of amlodipine, hydralazine, isosorbide dinitrate and metoprolol.   Acute coronary syndrome: appreciate cardiology input  Secondary Hyperparathyroidism: continue velphoro  Anemia of chronic kidney disease: mircera as outpatient.    LOS: 0 Anthony Hess 6/25/20242:55 PM

## 2022-08-17 NOTE — ED Notes (Signed)
Called lab regarding repeat troponin that was sent at 1104; after searching, lab tech was able to find the sample. They will now run troponin.

## 2022-08-17 NOTE — ED Notes (Signed)
Second troponin was sent at 1103. Lab called to confirm they received.

## 2022-08-17 NOTE — ED Provider Notes (Signed)
[facility] Provider Note    Event Date/Time   First MD Initiated Contact with Patient 08/17/22 [phone]     (approximate)   History   Chest Pain   HPI  Anthony Hess is a 67 y.o. male past medical history of coronary disease, HFrEF, end-stage renal disease on dialysis, hypertension who presents with elevated blood pressure.  Patient tells me that he was at dialysis he received about 20 minutes and his blood pressure was significantly elevated so they called EMS.  When asked specifically he says he has had some chest pain since last night describes it as a mild ache in the right side of his chest that does not radiate has been constant since onset.  Denies associated nausea diaphoresis or dyspnea.  Tells me he does not get chest pain chronically.  Has had mild cough is nonproductive.  Tells me he takes his blood pressure medications after dialysis usually.  Last dialysis before today was on Saturday and had a full session.  Past Medical History:  Diagnosis Date   Aortic atherosclerosis (HCC)    Bilateral carotid artery disease (HCC)    Bladder cancer (HCC)    Coronary artery disease 12/20/2018   a.) LHC 12/20/2018: 50% OM1, 40% OM2, 95% o-pLAD, 75% o=pLCx, 40% mLM, 70% D1, 60% mRCA-1, 50% mRCA-2; refer to CVTS. b.) 4v CABG at [facility] on 12/27/2018: LIMA-LAD, RIMA-PDA, seg LRA-OM1-D1   DCM (dilated cardiomyopathy) (HCC) 12/05/2018   a.) TTE 12/05/2018: EF 40-45%. b.) TTE 12/28/2019: EF 20-25%.   ESRD (end stage renal disease) (HCC)    a.) T-Th-Sat   HFrEF (heart failure with reduced ejection fraction) (HCC) 12/05/2018   a.) TTE 12/05/2018: EF 40-45%; mild LVH; ant/apical/sep HK; mild TR . b.) TTE 12/28/2019: EF 20-25%; mod LVH; mod MR/AR; G1DD.   History of 2019 novel coronavirus disease (COVID-19) 04/08/2021   History of kidney stones    HLD (hyperlipidemia)    Hx of CABG 12/27/2018   Hypertension    Infrarenal abdominal aortic aneurysm (AAA) without rupture (HCC)  03/05/2021   a.) CT abd/pelvis; measured 3.2 cm.   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   a.) LIMA-LAD, RIMA-PDA, sequential LEFT radial artery to OM1 and D1   Sepsis (HCC) 03/14/2021   Wears glasses     Patient Active Problem List   Diagnosis Date Noted   Seasonal allergic rhinitis due to pollen 06/25/2022   Nausea 06/25/2022   Hoarseness of voice 06/25/2022   Establishing care with new doctor, encounter for 06/25/2022   S/P exploratory laparotomy for perforated duodenum with admission 04/26/22 05/04/2022   Postprocedural intraabdominal abscess 05/04/2022   Acute pulmonary embolism (HCC) 04/19/2022   Dyslipidemia 04/19/2022   End-stage renal disease on hemodialysis (HCC) 04/19/2022   Tobacco dependence 04/19/2022   Chronic systolic heart failure (HCC) 04/05/2022   Chronic back pain 11/16/2021   Hypertension secondary to other renal disorders 10/15/2021   Vitamin B12 deficiency anemia due to intrinsic factor deficiency 06/23/2021   Abdominal pain    SBP (spontaneous bacterial peritonitis) (HCC) 04/08/2021   COVID-19 virus infection 04/08/2021   AAA (abdominal aortic aneurysm) (HCC) 03/18/2021   Spontaneous bacterial peritonitis (HCC) 03/04/2021   Incisional hernia, without obstruction or gangrene    Generalized (acute) peritonitis (HCC) 12/10/2020   Iron deficiency anemia, unspecified  05/06/2020   Moderate protein-calorie malnutrition (HCC) 05/06/2020   Other long term (current) drug therapy 05/06/2020   Unspecified jaundice 05/06/2020   PD catheter dysfunction (HCC) 04/13/2020   Bilateral carotid artery stenosis 02/29/2020   Coronary artery disease involving coronary bypass graft of native heart 02/29/2020   Secondary hyperparathyroidism of renal origin (HCC) 01/30/2020   Acute peritonitis  (HCC) 01/08/2020   Hyperlipidemia, mixed 01/04/2020   Mass of left side of neck 01/04/2020   Senile purpura (HCC) 01/04/2020   Hydroureteronephrosis    Atrial fibrillation (HCC) 01/05/2019   End stage renal disease (HCC) 01/05/2019   Anemia in chronic kidney disease (CODE) 01/05/2019   Presence of aortocoronary bypass graft 12/27/2018   Other emphysema (HCC) 04/14/2018   Bilateral hydronephrosis 04/03/2018   Hypertension 03/27/2018   Cigarette smoker 03/20/2018   History of bladder cancer 03/20/2018     Physical Exam  Triage Vital Signs: ED Triage Vitals  Enc Vitals Group     BP 08/17/22 0831 (!) 194/121     Pulse Rate 08/17/22 0831 83     Resp 08/17/22 0831 18     Temp 08/17/22 0831 98.5 F (36.9 C)     Temp Source 08/17/22 0831 Oral     SpO2 08/17/22 0831 94 %     Weight 08/17/22 0829 137 lb (62.1 kg)     Height 08/17/22 0829 5\' 2"  (1.575 m)     Head Circumference --      Peak Flow --      Pain Score 08/17/22 0828 3     Pain Loc --      Pain Edu? --      Excl. in GC? --     Most recent vital signs: Vitals:   08/17/22 0831  BP: (!) 194/121  Pulse: 83  Resp: 18  Temp: 98.5 F (36.9 C)  SpO2: 94%     General: Awake, no distress.  CV:  Good peripheral perfusion.  Resp:  Normal effort. Lungs are clear  Abd:  No distention. Soft, nontender to palpation  Neuro:             Awake, Alert, Oriented x 3  Other:  No peripheral edema   ED Results / Procedures / Treatments  Labs (all labs ordered are listed, but only abnormal results are displayed) Labs Reviewed  BASIC METABOLIC PANEL - Abnormal; Notable for the following components:      Result Value   CO2 19 (*)    BUN 55 (*)    Creatinine, Ser 8.02 (*)    Calcium 7.4 (*)    GFR, Estimated 7 (*)    All other components within normal limits  CBC - Abnormal; Notable for the following components:   WBC 13.5 (*)    Hemoglobin 12.9 (*)    RDW 15.6 (*)    All other components within normal limits  TROPONIN I  (HIGH SENSITIVITY) - Abnormal; Notable for the following components:   Troponin I (High Sensitivity) 138 (*)    All other components within normal limits  TROPONIN I (HIGH SENSITIVITY)     EKG  I reviewed and interpreted patient's EKG which shows sinus rhythm with a left axis deviation, incomplete right bundle branch block, ST depression V4 V5 V6, inverted T waves 1 aVL, less than 1 mm ST elevation in V1 V2 aVR, similar to prior EKG from 6/18   RADIOLOGY I reviewed and interpreted the CXR which does not show any acute cardiopulmonary process  PROCEDURES:  Critical Care performed: Yes, see critical care procedure note(s)  .Critical Care  Performed by: Georga Hacking, MD Authorized by: Georga Hacking, MD   Critical care provider statement:    Critical care time (minutes):  30   Critical care was time spent personally by me on the following activities:  Development of treatment plan with patient or surrogate, discussions with consultants, evaluation of patient's response to treatment, examination of patient, ordering and review of laboratory studies, ordering and review of radiographic studies, ordering and performing treatments and interventions, pulse oximetry, re-evaluation of patient's condition and review of old charts   The patient is on the cardiac monitor to evaluate for evidence of arrhythmia and/or significant heart rate changes.   MEDICATIONS ORDERED IN ED: Medications  aspirin chewable tablet 324 mg (324 mg Oral Not Given 08/17/22 1004)  labetalol (NORMODYNE) injection 20 mg (has no administration in time range)  nitroGLYCERIN (NITROSTAT) SL tablet 0.4 mg (0.4 mg Sublingual Given 08/17/22 0956)  nitroGLYCERIN (NITROGLYN) 2 % ointment 1 inch (1 inch Topical Given 08/17/22 1004)  labetalol (NORMODYNE) injection 10 mg (10 mg Intravenous Given 08/17/22 0954)     IMPRESSION / MDM / ASSESSMENT AND PLAN / ED COURSE  I reviewed the triage vital signs and the nursing  notes.                              Patient's presentation is most consistent with acute presentation with potential threat to life or bodily function.  Differential diagnosis includes, but is not limited to, hypertensive emergency, demand ischemia, ACS, aortic dissection, pulmonary embolism  The patient is a 67 year old male with history of end-stage renal disease, heart failure with reduced EF, coronary disease who presents with elevated blood pressure.  He was at dialysis today and was noted to be hypertensive.  Initially when I asked the patient why he is here he tells me it is because blood pressure was elevated.  When I asked him specifically he says he is having some chest pain which she describes as mild and started last night.  Its pressure-like nonradiating nonexertional has been constant since onset.  Denies associated nausea dyspnea or diaphoresis.  Initial BP 194/120, sats in the mid 90s.  He looks comfortable he is not in distress.  Lung sounds are clear no signs of peripheral edema  He is significantly abnormal with incomplete right bundle branch block with some ST elevation V1 V2 ST depression V4 V5 V6 and inferiorly however this does appear almost identical to patient's EKG last on 6/18.  First troponin is 138.  Will repeat.  He did receive 324 of aspirin with EMS.  Will give sublingual nitro and Nitropaste and labetalol to control BP.    Given patient's rather mild pain my suspicion for true ACS is low suspect this is likely demand.  Deferring heparin at this time pending repeat troponin. Let Dr. Wynelle Link with nephrology know about the patient.  Will admit to the hospitalist service.       FINAL CLINICAL IMPRESSION(S) / ED DIAGNOSES   Final diagnoses:  Hypertension, unspecified type  NSTEMI (non-ST elevated myocardial infarction) (HCC)     Rx / DC Orders   ED Discharge Orders     None        Note:  This document was prepared using Dragon voice recognition  software and may include unintentional dictation errors.   Georga Hacking, MD 08/17/22  1034  

## 2022-08-17 NOTE — ED Notes (Signed)
Pt comes back to nurses station ambulatory asking about leaving.  Pt denies any CP or sob and states that he feels well

## 2022-08-17 NOTE — Assessment & Plan Note (Signed)
Baseline ESRD on hemodialysis Tuesday Thursday Saturday Partial dialysis today in the setting of elevated blood pressure and chest pain Dr. Wynelle Link with nephrology aware of case per Dr. Evelena Leyden Follow-up nephrology recommendations

## 2022-08-17 NOTE — H&P (Addendum)
History and Physical    Patient: Anthony Hess ZOX:[id number] DOB: 01/08/1956 DOA: 08/17/2022 DOS: the patient was seen and examined on 08/17/2022 PCP: Ronnald Ramp, MD  Patient coming from: Home  Chief Complaint:  Chief Complaint  Patient presents with   Chest Pain   HPI: Anthony Hess is a 67 y.o. male with medical history significant of coronary disease status post four-vessel CABG, ESRD on hemodialysis TTS, HFpEF, hypertension, hyperlipidemia, peripheral vascular disease, atrial fibrillation, PE on Eliquis, tobacco abuse presenting with NSTEMI, hypertensive urgency.  Patient reports intermittent right-sided chest pain over the past 24 to 48 hours.  No shortness of breath.  No nausea or vomiting.  No diaphoresis.  Baseline ESRD.  Went to dialysis today, however was redirected to the ER because of elevated blood pressure.  Noted recent admissions including March 2024 for postprocedural intra-abdominal abscess and bowel perforation-2 separate admissions.  Febrile 2024 admission for NSTEMI, PE.  No abdominal pain.  Has been compliant with home regimen including Eliquis.  Presented to the ER afebrile, blood pressure 160s to 190s/100s-120s.  Troponin 138.  Creatinine 8, hemoglobin 12.9.  White count 13.5.  Platelets 267.  EKG normal sinus rhythm with nonspecific ST and T wave changes as well as bundle branch block-relatively unchanged from prior. Review of Systems: As mentioned in the history of present illness. All other systems reviewed and are negative. Past Medical History:  Diagnosis Date   Aortic atherosclerosis (HCC)    Bilateral carotid artery disease (HCC)    Bladder cancer (HCC)    Coronary artery disease 12/20/2018   a.) LHC 12/20/2018: 50% OM1, 40% OM2, 95% o-pLAD, 75% o=pLCx, 40% mLM, 70% D1, 60% mRCA-1, 50% mRCA-2; refer to CVTS. b.) 4v CABG at [facility] on 12/27/2018: LIMA-LAD, RIMA-PDA, seg LRA-OM1-D1   DCM (dilated cardiomyopathy) (HCC) 12/05/2018   a.) TTE 12/05/2018: EF  40-45%. b.) TTE 12/28/2019: EF 20-25%.   ESRD (end stage renal disease) (HCC)    a.) T-Th-Sat   HFrEF (heart failure with reduced ejection fraction) (HCC) 12/05/2018   a.) TTE 12/05/2018: EF 40-45%; mild LVH; ant/apical/sep HK; mild TR . b.) TTE 12/28/2019: EF 20-25%; mod LVH; mod MR/AR; G1DD.   History of 2019 novel coronavirus disease (COVID-19) 04/08/2021   History of kidney stones    HLD (hyperlipidemia)    Hx of CABG 12/27/2018   Hypertension    Infrarenal abdominal aortic aneurysm (AAA) without rupture (HCC) 03/05/2021   a.) CT abd/pelvis; measured 3.2 cm.   Melena 05/04/2022   Myocardial infarction [facility])    NSTEMI (non-ST elevated myocardial infarction) (HCC) 04/19/2022   Perforation bowel (HCC) 04/26/2022   PVD (peripheral vascular disease) (HCC)    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   a.) LIMA-LAD, RIMA-PDA, sequential LEFT radial artery to OM1 and D1   Sepsis (HCC) 03/14/2021   Wears glasses    Past Surgical History:  Procedure Laterality Date   AV FISTULA PLACEMENT Left 07/30/2021   Procedure: INSERTION OF ARTERIOVENOUS (AV) GORE-TEX GRAFT ARM BRACHIAL ARTERY TO AXILLARY VEIN;  Surgeon: Annice Needy, MD;  Location: [facility] ORS;  Service: Vascular;  Laterality: Left;   CAPD INSERTION N/A 12/31/2019   Procedure: LAPAROSCOPIC INSERTION CONTINUOUS AMBULATORY PERITONEAL DIALYSIS  (CAPD) CATHETER;  Surgeon: Leafy Ro, MD;  Location: [facility] OR[facility];  Service: General;  Laterality: N/A;   CAPD REMOVAL N/A 04/10/2020   Procedure: LAPAROSCOPIC REVISION OF CONTINUOUS AMBULATORY PERITONEAL DIALYSIS  (CAPD) CATHETER;  Surgeon: Leafy Ro, MD;  Location: [facility];  Service: General;  Laterality:  N/A;   CORONARY ARTERY BYPASS GRAFT N/A 12/27/2018   Procedure: CORONARY ARTERY BYPASS GRAFTING (CABG) X 4 ON PUMP USING RIGHT & LEFT INTERNAL MAMMARY ARTERY LEFT RADIAL ARTERY ENDOSCOPICALLY HARVESTED;  Surgeon: Linden Dolin, MD;  [facility];  Service: Open Heart Surgery;  Laterality: N/A;    CYSTOSCOPY W/ RETROGRADES Bilateral 05/15/2019   Procedure: CYSTOSCOPY WITH RETROGRADE PYELOGRAM;  Surgeon: Riki Altes, MD;  Location: [facility];  Service: Urology;  Laterality: Bilateral;   CYSTOSCOPY WITH BIOPSY N/A 05/15/2019   Procedure: CYSTOSCOPY WITH bladder BIOPSY;  Surgeon: Riki Altes, MD;  Location: [facility];  Service: Urology;  Laterality: N/A;   DIALYSIS/PERMA CATHETER INSERTION N/A 12/28/2019   Procedure: DIALYSIS/PERMA CATHETER INSERTION;  Surgeon: Annice Needy, MD;  Location: [facility];  Service: Cardiovascular;  Laterality: N/A;   DIALYSIS/PERMA CATHETER INSERTION N/A 03/18/2021   Procedure: DIALYSIS/PERMA CATHETER INSERTION;  Surgeon: Annice Needy, MD;  Location: [facility];  Service: Cardiovascular;  Laterality: N/A;   DIALYSIS/PERMA CATHETER REMOVAL N/A 06/02/2020   Procedure: DIALYSIS/PERMA CATHETER REMOVAL;  Surgeon: Annice Needy, MD;  Location: [facility];  Service: Cardiovascular;  Laterality: N/A;   EXCHANGE OF A DIALYSIS CATHETER Right 04/10/2020   Procedure: EXCHANGE OF A DIALYSIS CATHETER;  Surgeon: Leafy Ro, MD;  Location: [facility];  Service: General;  Laterality: Right;   INCISIONAL HERNIA REPAIR  01/20/2021   Procedure: HERNIA REPAIR INCISIONAL;  Surgeon: Henrene Dodge, MD;  Location: [facility];  Service: General;;   IR IMAGE GUIDED DRAINAGE PERCUT CATH  PERITONEAL RETROPERIT  04/07/2020   LAPAROSCOPY N/A 04/16/2021   Procedure: LAPAROSCOPY DIAGNOSTIC;  Surgeon: Sung Amabile, DO;  Location: [facility];  Service: General;  Laterality: N/A;   LAPAROTOMY N/A 04/26/2022   Procedure: EXPLORATORY LAPAROTOMY WITH REPAIR OF DUODENAL ULCER;  Surgeon: Carolan Shiver, MD;  Location: [facility];  Service: General;  Laterality: N/A;   LEFT HEART CATH AND CORONARY ANGIOGRAPHY Left 12/20/2018   Procedure: LEFT HEART CATH AND CORONARY ANGIOGRAPHY;  Surgeon: Marcina Millard, MD;  Location: [facility];  Service: Cardiovascular;   Laterality: Left;   RADIAL ARTERY HARVEST Left 12/27/2018   Procedure: ENDOSCOPIC RADIAL ARTERY HARVEST;  Surgeon: Linden Dolin, MD;  [facility];  Service: Open Heart Surgery;  Laterality: Left;   REMOVAL OF A DIALYSIS CATHETER Left 03/20/2021   Procedure: REMOVAL OF A PD CATHETER;  Surgeon: Annice Needy, MD;  Location: [facility];  Service: Vascular;  Laterality: Left;   REVISION OF ARTERIOVENOUS GORETEX GRAFT Left 09/11/2021   Procedure: Excisionof infected AV graft;  Surgeon: Renford Dills, MD;  Location: [facility];  Service: Vascular;  Laterality: Left;   TEE WITHOUT CARDIOVERSION N/A 12/27/2018   Procedure: TRANSESOPHAGEAL ECHOCARDIOGRAM (TEE);  Surgeon: Linden Dolin, MD;  Location: [facility] OR;  Service: Open Heart Surgery;  Laterality: N/A;   TUMOR REMOVAL  2019   Bladder   Social History:  reports that he has been smoking cigarettes. He has been smoking an average of .5 packs per day. He has never used smokeless tobacco. He reports that he does not drink alcohol and does not use drugs.  No Known Allergies  Family History  Family history unknown: Yes    Prior to Admission medications   Medication Sig Start Date End Date Taking? Authorizing Provider  acetaminophen (TYLENOL) 500 MG tablet Take 2 tablets (1,000 mg total) by mouth every 6 (six) hours as needed for mild pain,  fever or headache (or Fever >/= 101). 11/18/21   Sunnie Nielsen, DO  amLODipine (NORVASC) 2.5 MG tablet Take 1 tablet (2.5 mg total) by mouth daily. 05/07/22   Alford Highland, MD  apixaban (ELIQUIS) 5 MG TABS tablet Take 1 tablet (5 mg total) by mouth 2 (two) times daily. 05/01/22   Baron Hamper, MD  Aspirin 81 MG CAPS Take by mouth. 05/06/20   [provider]  atorvastatin (LIPITOR) 80 MG tablet Take 1 tablet (80 mg total) by mouth daily. 04/21/22   Lurene Shadow, MD  calcitRIOL (ROCALTROL) 0.5 MCG capsule Take 1 capsule (0.5 mcg total) by mouth daily. 11/18/21   Sunnie Nielsen, DO  calcium  acetate (PHOSLO) 667 MG capsule Take 1 capsule (667 mg total) by mouth 3 (three) times daily with meals. 11/18/21   Sunnie Nielsen, DO  cefpodoxime (VANTIN) 200 MG tablet Take 1 tablet (200 mg total) by mouth daily for 7 days. 08/10/22 08/17/22  Georga Hacking, MD  cetirizine (ZYRTEC) 5 MG chewable tablet Chew 1 tablet (5 mg total) by mouth every Tuesday, Thursday, and Saturday at 6 PM. 06/26/22   Simmons-Robinson, [name], MD  hydrALAZINE (APRESOLINE) 25 MG tablet Take 1 tablet (25 mg total) by mouth 3 (three) times daily. 04/21/22   Lurene Shadow, MD  isosorbide dinitrate (ISORDIL) 10 MG tablet Take 1 tablet (10 mg total) by mouth 3 (three) times daily. 04/21/22   Lurene Shadow, MD  metoprolol succinate (TOPROL-XL) 25 MG 24 hr tablet Take 25 mg by mouth daily. 03/12/22   [provider]  multivitamin (RENA-VIT) TABS tablet Take 1 tablet by mouth at bedtime. 03/23/21   Esaw Grandchild A, DO  ondansetron (ZOFRAN-ODT) 4 MG disintegrating tablet Take 1 tablet (4 mg total) by mouth every 8 (eight) hours as needed for nausea or vomiting. 06/25/22   Simmons-Robinson, Makiera, MD  pantoprazole (PROTONIX) 40 MG tablet Take 1 tablet (40 mg total) by mouth daily. 05/07/22 06/06/22  Alford Highland, MD  sucroferric oxyhydroxide (VELPHORO) 500 MG chewable tablet Chew 1 tablet (500 mg total) by mouth 3 (three) times daily with meals. 11/18/21   Sunnie Nielsen, DO  triamcinolone (NASACORT) 55 MCG/ACT AERO nasal inhaler Place 2 sprays into the nose daily. 06/25/22   Simmons-Robinson, Makiera, MD  Vitamin D, Ergocalciferol, (DRISDOL) 1.25 MG (50000 UNIT) CAPS capsule Take 1 capsule (50,000 Units total) by mouth every 7 (seven) days. 11/18/21   Sunnie Nielsen, DO    Physical Exam: Vitals:   08/17/22 0831 08/17/22 0945 08/17/22 1000 08/17/22 1030  BP: (!) 194/121 (!) 187/117 (!) 181/122 (!) 168/104  Pulse: 83 (!) 29 67 67  Resp: 18 18 20  (!) 22  Temp: 98.5 F (36.9 C)     TempSrc: Oral     SpO2: 94%  (!) 77% 95% 92%  Weight:      Height:       Physical Exam Constitutional:      Appearance: He is normal weight.  HENT:     Head: Normocephalic and atraumatic.     Nose: Nose normal.     Mouth/Throat:     Mouth: Mucous membranes are moist.  Eyes:     Pupils: Pupils are equal, round, and reactive to light.  Cardiovascular:     Rate and Rhythm: Normal rate and regular rhythm.  Pulmonary:     Effort: Pulmonary effort is normal.  Abdominal:     General: Abdomen is flat. Bowel sounds are normal.  Musculoskeletal:  General: Normal range of motion.     Cervical back: Normal range of motion.  Skin:    General: Skin is warm.  Neurological:     General: No focal deficit present.  Psychiatric:        Mood and Affect: Mood normal.     Data Reviewed:  There are no new results to review at this time. DG Chest 2 View CLINICAL DATA:  Chest pain, dialysis patient  EXAM: CHEST - 2 VIEW  COMPARISON:  08/10/2022 chest radiograph.  FINDINGS: Right internal jugular dialysis catheter terminates over the right atrium. Intact sternotomy wires. Stable cardiomediastinal silhouette with normal heart size. No pneumothorax. No pleural effusion. Lungs appear clear, with no acute consolidative airspace disease and no pulmonary edema.  IMPRESSION: No active cardiopulmonary disease.  Electronically Signed   By: Delbert Phenix M.D.   On: 08/17/2022 09:04  Last metabolic panel Lab Results  Component Value Date   GLUCOSE 79 08/17/2022   NA 137 08/17/2022   K 4.8 08/17/2022   CL 104 08/17/2022   CO2 19 (L) 08/17/2022   BUN 55 (H) 08/17/2022   CREATININE 8.02 (H) 08/17/2022   GFRNONAA 7 (L) 08/17/2022   CALCIUM 7.4 (L) 08/17/2022   PHOS 8.9 (H) 05/01/2022   PROT 6.9 08/10/2022   ALBUMIN 3.6 08/10/2022   LABGLOB 2.4 06/25/2022   AGRATIO 1.8 06/25/2022   BILITOT 0.5 08/10/2022   ALKPHOS 56 08/10/2022   AST 17 08/10/2022   ALT 13 08/10/2022   ANIONGAP 14 08/17/2022   Lab  Results  Component Value Date   WBC 13.5 (H) 08/17/2022   HGB 12.9 (L) 08/17/2022   HCT 42.1 08/17/2022   MCV 94.2 08/17/2022   PLT 267 08/17/2022    Assessment and Plan: NSTEMI (non-ST elevated myocardial infarction) (HCC) Intermittent atypical chest pain with past 24 hours troponin in the 130s Prior troponin trend associated with chest pain has peaked from 400s to 600s Baseline ESRD, CAD status post CABG, hypertension Blood pressure 160s to 190s over 100s Suspect demand ischemia in the setting of volume overload with partial hemodialysis today, elevated BP EKG stable as compared to the prior Will trend troponin Will consult cardiology nonurgently Continue home regimen   Acute pulmonary embolism (HCC) On Eliquis Continue  Hypertensive urgency Blood pressure 160s to 180s over 110s Continue home oral regimen including Norvasc, hydralazine, metoprolol As needed labetalol Follow blood pressure with hemodialysis and volume status  (HFpEF) heart failure with preserved ejection fraction (HCC) 2D echo April 2024 with EF of 60 to 65% and grade 1 diastolic dysfunction Mild volume overload in the setting of hemodialysis Pending nephrology consult for likely dialysis within the next 24 hours Will otherwise monitor for now Strict ins and outs and daily weights Continue home regimen Fluid restriction  Anemia in chronic kidney disease (CODE) Hemoglobin 12.9 today which appears near baseline Noted prior history of GI bleeding No reported active bleeding Monitor  Hyperlipidemia, mixed Continue statin  Chronic systolic heart failure (HCC)    End stage renal disease (HCC) Baseline ESRD on hemodialysis Tuesday Thursday Saturday Partial dialysis today in the setting of elevated blood pressure and chest pain Dr. Wynelle Link with nephrology aware of case per Dr. Evelena Leyden Follow-up nephrology recommendations  Atrial fibrillation (HCC) Rate controlled at present Continue Eliquis and  metoprolol   Greater than 50% was spent in counseling and coordination of care with patient Total encounter time 80 minutes or more    Advance Care Planning:   Code Status:  Full Code   Consults: Nephrology   Family Communication: No family at the bedside  Severity of Illness: The appropriate patient status for this patient is OBSERVATION. Observation status is judged to be reasonable and necessary in order to provide the required intensity of service to ensure the patient's safety. The patient's presenting symptoms, physical exam findings, and initial radiographic and laboratory data in the context of their medical condition is felt to place them at decreased risk for further clinical deterioration. Furthermore, it is anticipated that the patient will be medically stable for discharge from the hospital within 2 midnights of admission.   Author: Floydene Flock, MD 08/17/2022 12:34 PM  For on call review [email or web address].

## 2022-08-17 NOTE — ED Notes (Signed)
Pt came back in at this time and agreed to stay.  Dr. Para March is aware.

## 2022-08-17 NOTE — Assessment & Plan Note (Signed)
Continue statin. 

## 2022-08-17 NOTE — ED Notes (Signed)
EDP at bedside. Pt denies n/v or diaphoresis. Pt denies SOB. Pt has same CP as usual. Starting IV now.

## 2022-08-17 NOTE — Assessment & Plan Note (Signed)
2D echo April 2024 with EF of 60 to 65% and grade 1 diastolic dysfunction Mild volume overload in the setting of hemodialysis Pending nephrology consult for likely dialysis within the next 24 hours Will otherwise monitor for now Strict ins and outs and daily weights Continue home regimen Fluid restriction

## 2022-08-17 NOTE — Assessment & Plan Note (Signed)
Rate controlled at present Continue Eliquis and metoprolol

## 2022-08-17 NOTE — Assessment & Plan Note (Signed)
On Eliquis Continue

## 2022-08-17 NOTE — ED Notes (Signed)
Lab does not have second troponin. Will resend.

## 2022-08-17 NOTE — Progress Notes (Signed)
Hemodialysis note  Received patient in bed to unit. Alert and oriented.  Informed consent signed and in chart.  Treatment initiated: 1351 Treatment completed: 1740  Patient tolerated well. Transported back to room, alert without acute distress.  Report given to patient's RN.   Access used: Right chest HD catheter Access issues: High arterial pressure. Lines were switched during HD.  Total UF removed: 1.5 L Medication(s) given:  none  Post HD weight: unable to obtain. bed scale is not working .   [facility]

## 2022-08-17 NOTE — ED Notes (Signed)
EDP was at bedside. 

## 2022-08-18 DIAGNOSIS — I160 Hypertensive urgency: Secondary | ICD-10-CM

## 2022-08-18 DIAGNOSIS — I214 Non-ST elevation (NSTEMI) myocardial infarction: Secondary | ICD-10-CM | POA: Diagnosis not present

## 2022-08-18 LAB — CBC
HCT: 39.1 % (ref 39.0–52.0)
Hemoglobin: 12 g/dL — ABNORMAL LOW (ref 13.0–17.0)
MCH: 28.7 pg (ref 26.0–34.0)
MCHC: 30.7 g/dL (ref 30.0–36.0)
MCV: 93.5 fL (ref 80.0–100.0)
Platelets: 247 10*3/uL (ref 150–400)
RBC: 4.18 MIL/uL — ABNORMAL LOW (ref 4.22–5.81)
RDW: 15.5 % (ref 11.5–15.5)
WBC: 9.8 10*3/uL (ref 4.0–10.5)
nRBC: 0 % (ref 0.0–0.2)

## 2022-08-18 LAB — COMPREHENSIVE METABOLIC PANEL
ALT: 18 U/L (ref 0–44)
AST: 17 U/L (ref 15–41)
Albumin: 2.8 g/dL — ABNORMAL LOW (ref 3.5–5.0)
Alkaline Phosphatase: 50 U/L (ref 38–126)
Anion gap: 11 (ref 5–15)
BUN: 34 mg/dL — ABNORMAL HIGH (ref 8–23)
CO2: 23 mmol/L (ref 22–32)
Calcium: 6.7 mg/dL — ABNORMAL LOW (ref 8.9–10.3)
Chloride: 105 mmol/L (ref 98–111)
Creatinine, Ser: 5.6 mg/dL — ABNORMAL HIGH (ref 0.61–1.24)
GFR, Estimated: 11 mL/min — ABNORMAL LOW (ref >60)
Glucose, Bld: 105 mg/dL — ABNORMAL HIGH (ref 70–99)
Potassium: 3.8 mmol/L (ref 3.5–5.1)
Sodium: 139 mmol/L (ref 135–145)
Total Bilirubin: 0.2 mg/dL — ABNORMAL LOW (ref 0.3–1.2)
Total Protein: 5.7 g/dL — ABNORMAL LOW (ref 6.5–8.1)

## 2022-08-18 LAB — MRSA NEXT GEN BY PCR, NASAL: MRSA by PCR Next Gen: NOT DETECTED

## 2022-08-18 LAB — HEPATITIS B SURFACE ANTIBODY, QUANTITATIVE: Hep B S AB Quant (Post): 24.4 m[IU]/mL (ref >9.9)

## 2022-08-18 LAB — TROPONIN I (HIGH SENSITIVITY): Troponin I (High Sensitivity): 122 ng/L (ref <18)

## 2022-08-18 MED ORDER — APIXABAN 2.5 MG PO TABS
2.5000 mg | ORAL_TABLET | Freq: Once | ORAL | Status: AC
  Administered 2022-08-18: 2.5 mg via ORAL
  Filled 2022-08-18: qty 1

## 2022-08-18 MED ORDER — APIXABAN 5 MG PO TABS: 5.0000 mg | ORAL_TABLET | Freq: Two times a day (BID) | ORAL | Status: DC

## 2022-08-18 NOTE — Discharge Planning (Signed)
ESTABLISHED HEMODIALYSIS  Outpatient Facility [facility] [place] [address] [place], [place] [address] [phone]  Scheduled days: Tuesday Thursday and Saturday  Treatment time: 6:00am 

## 2022-08-18 NOTE — Progress Notes (Addendum)
[facility]  ROUNDING NOTE   Subjective:   [name] was admitted to [facility] on 08/17/2022 for NSTEMI (non-ST elevated myocardial infarction) (HCC) [I21.4] Hypertension, unspecified type [I10]  Patient seen sitting up in chair Alert and oriented Stated he felt better after dialysis  Chart review notes he left AMA but returned due to chest pain  Denies chest pain today   Objective:  Vital signs in last 24 hours:  Temp:  [97.6 F (36.4 C)-98.6 F (37 C)] 98 F (36.7 C) (06/26 0805) Pulse Rate:  [53-76] 76 (06/26 0805) Resp:  [15-23] 20 (06/26 0805) BP: (94-152)/(60-104) 120/83 (06/26 0805) SpO2:  [17 %-98 %] 96 % (06/26 0805) Weight:  [53.2 kg] 53.2 kg (06/25 2303)  Weight change:  Filed Weights   08/17/22 0829 08/17/22 2303  Weight: 62.1 kg 53.2 kg    Intake/Output: I/O last 3 completed shifts: In: -  Out: 1500 [Other:1500]   Intake/Output this shift:  Total I/O In: 120 [P.O.:120] Out: -   Physical Exam: General: NAD  Head: Normocephalic, atraumatic. Moist oral mucosal membranes  Eyes: Anicteric  Lungs:  Clear to auscultation  Heart: Regular rate and rhythm  Abdomen:  Soft, nontender,   Extremities:  no peripheral edema.  Neurologic: Nonfocal, moving all four extremities  Skin: No lesions  Access: RIj permcath    Basic Metabolic Panel: Recent Labs  Lab 08/17/22 0832 08/18/22 0511  NA 137 139  K 4.8 3.8  CL 104 105  CO2 19* 23  GLUCOSE 79 105*  BUN 55* 34*  CREATININE 8.02* 5.60*  CALCIUM 7.4* 6.7*     Liver Function Tests: Recent Labs  Lab 08/18/22 0511  AST 17  ALT 18  ALKPHOS 50  BILITOT 0.2*  PROT 5.7*  ALBUMIN 2.8*    No results for input(s): "LIPASE", "AMYLASE" in the last 168 hours.  No results for input(s): "AMMONIA" in the last 168 hours.  CBC: Recent Labs  Lab 08/17/22 0832 08/18/22 0511  WBC 13.5* 9.8  HGB 12.9* 12.0*  HCT 42.1 39.1  MCV 94.2 93.5  PLT 267 247     Cardiac Enzymes: No  results for input(s): "CKTOTAL", "CKMB", "CKMBINDEX", "TROPONINI" in the last 168 hours.  BNP: Invalid input(s): "POCBNP"  CBG: No results for input(s): "GLUCAP" in the last 168 hours.  Microbiology: Results for orders placed or performed during the hospital encounter of 08/17/22  MRSA Next Gen by PCR, Nasal     Status: None   Collection Time: 08/17/22 11:25 PM   Specimen: Nasal Mucosa; Nasal Swab  Result Value Ref Range Status   MRSA by PCR Next Gen NOT DETECTED NOT DETECTED Final    Comment: (NOTE) The GeneXpert MRSA Assay (FDA approved for NASAL specimens only), is one component of a comprehensive MRSA colonization surveillance program. It is not intended to diagnose MRSA infection nor to guide or monitor treatment for MRSA infections. Test performance is not FDA approved in patients less than 32 years old. Performed at [facility], [address] [place], [place] [address]     Coagulation Studies: No results for input(s): "LABPROT", "INR" in the last 72 hours.  Urinalysis: No results for input(s): "COLORURINE", "LABSPEC", "PHURINE", "GLUCOSEU", "HGBUR", "BILIRUBINUR", "KETONESUR", "PROTEINUR", "UROBILINOGEN", "NITRITE", "LEUKOCYTESUR" in the last 72 hours.  Invalid input(s): "APPERANCEUR"    Imaging: DG Chest 2 View  Result Date: 08/17/2022 CLINICAL DATA:  Chest pain, dialysis patient EXAM: CHEST - 2 VIEW COMPARISON:  08/10/2022 chest radiograph. FINDINGS: Right internal jugular  dialysis catheter terminates over the right atrium. Intact sternotomy wires. Stable cardiomediastinal silhouette with normal heart size. No pneumothorax. No pleural effusion. Lungs appear clear, with no acute consolidative airspace disease and no pulmonary edema. IMPRESSION: No active cardiopulmonary disease. Electronically Signed   By: Delbert Phenix M.D.   On: 08/17/2022 09:04     Medications:    sodium chloride      amLODipine  2.5 mg Oral Daily   apixaban  5 mg Oral BID    aspirin  81 mg Oral Daily   atorvastatin  40 mg Oral QPM   Chlorhexidine Gluconate Cloth  6 each Topical Q0600   hydrALAZINE  25 mg Oral TID   isosorbide dinitrate  10 mg Oral TID   metoprolol succinate  25 mg Oral Daily   multivitamin  1 tablet Oral QHS   sodium chloride flush  3 mL Intravenous Q12H   sucroferric oxyhydroxide  1,000 mg Oral TID WC   sodium chloride, acetaminophen, sodium chloride flush  Assessment/ Plan:  [name] is a 67 y.o.  male with end stage renal disease on hemodialysis, hypertension, coronary artery disease status post CABG, AAA, carotid artery disease, hyperlipidemia, congestive heart failure, and history of bladder cancer who presents to [facility] on 08/17/2022 for NSTEMI (non-ST elevated myocardial infarction) (HCC) [I21.4] Hypertension, unspecified type [I10]  [facility] TTS [facility] [place] RIJ permcath 53kg  End Stage Renal Disease: Treatment received yesterday, UF 1.5L Next treatment scheduled for Thursday  Hypertension with chronic kidney disease: with hypertensive urgency on admission. Regimen of amlodipine, hydralazine, isosorbide dinitrate and metoprolol. Blood pressure stable  Acute coronary syndrome: appreciate cardiology input, will follow up with patient outpatient  Secondary Hyperparathyroidism: Calcium reduced, 6.7. Continue velphoro   Anemia of chronic kidney disease: mircera as outpatient. Hgb stable   LOS: 0 Anthony Hess 6/26/20241:35 PM

## 2022-08-18 NOTE — TOC CM/SW Note (Signed)
Transition of Care [facility]) - Inpatient Brief Assessment   Patient Details  Name: Enrrique Mierzwa MRN: [id number] Date of Birth: 1955/03/10  Transition of Care [facility]) CM/SW Contact:    Margarito Liner, LCSW Phone Number: 08/18/2022, 1:57 PM   Clinical Narrative: Patient has orders to discharge home today. No TOC needs identified other than a cab voucher. Address on facesheet is correct. Filled out and faxed to [facility]. No further concerns. CSW signing off.  Transition of Care Asessment: Insurance and Status: Insurance coverage has been reviewed Patient has primary care physician: Yes Home environment has been reviewed: Single family home Prior level of function:: Independent Prior/Current Home Services: No current home services Social Determinants of Health Reivew: SDOH reviewed no interventions necessary Readmission risk has been reviewed: Yes Transition of care needs: no transition of care needs at this time

## 2022-08-18 NOTE — Discharge Summary (Signed)
Physician Discharge Summary  Anthony Hess ZOX:[id number] DOB: [birth year] DOA: 08/17/2022  PCP: Ronnald Ramp, MD  Admit date: 08/17/2022 Discharge date: 08/18/2022  Admitted From: home  Disposition:  home   Recommendations for Outpatient Follow-up:  Follow up with PCP in 1-2 weeks F/u w/ cardio, Dr. Ozella Almond, in 1-2 weeks   Home Health: no  Equipment/Devices:  Discharge Condition: stable CODE STATUS: full Diet recommendation: Heart Healthy  Brief/Interim Summary: HPI was taken from Dr. Alvester Morin: Anthony Hess is a 67 y.o. male with medical history significant of coronary disease status post four-vessel CABG, ESRD on hemodialysis TTS, HFpEF, hypertension, hyperlipidemia, peripheral vascular disease, atrial fibrillation, PE on Eliquis, tobacco abuse presenting with NSTEMI, hypertensive urgency.  Patient reports intermittent right-sided chest pain over the past 24 to 48 hours.  No shortness of breath.  No nausea or vomiting.  No diaphoresis.  Baseline ESRD.  Went to dialysis today, however was redirected to the ER because of elevated blood pressure.  Noted recent admissions including March 2024 for postprocedural intra-abdominal abscess and bowel perforation-2 separate admissions.  Febrile 2024 admission for NSTEMI, PE.  No abdominal pain.  Has been compliant with home regimen including Eliquis.  Presented to the ER afebrile, blood pressure 160s to 190s/100s-120s.  Troponin 138.  Creatinine 8, hemoglobin 12.9.  White count 13.5.  Platelets 267.  EKG normal sinus rhythm with nonspecific ST and T wave changes as well as bundle branch block-relatively unchanged from prior.    Discharge Diagnoses:  Active Problems:   NSTEMI (non-ST elevated myocardial infarction) (HCC)   Hypertensive urgency   Acute pulmonary embolism (HCC)   Atrial fibrillation (HCC)   End stage renal disease (HCC)   Chronic systolic heart failure (HCC)   Hyperlipidemia, mixed   Anemia in chronic kidney disease  (CODE)   (HFpEF) heart failure with preserved ejection fraction (HCC) Elevated troponins: likely secondary to demand ischemia in setting of HTN urgency & ESRD. NSTEMI r/o as per cardio. Pt denies any chest pain. No further cardiac inpatient work-up indicated as per cardio. Cardio recs apprec    PE: continue on eliquis    Hypertensive urgency: urgency resolved but still w/ HTN. Continue on amlodipine, metoprolol, hydralazine    Chronic diastolic CHF: echo April 2024 with EF of 60 to 65% and grade 1 diastolic dysfunction. Monitor I/Os. HD for fluid management    ACD: likely secondary to ESRD. No need for a transfusion currently  HLD: continue on statin    ESRD on HD: continue w/ HD as per nephro. Nephro recs apprec   Likely PAF: continue on metoprolol, eliquis    Discharge Instructions  Discharge Instructions     Diet - low sodium heart healthy   Complete by: As directed    Discharge instructions   Complete by: As directed    F/u w/ PCP in 1-2 weeks. F/u w/ cardio, Dr. Briant Sites, in 1-2 weeks   Increase activity slowly   Complete by: As directed       Allergies as of 08/18/2022   No Known Allergies      Medication List     STOP taking these medications    cefpodoxime 200 MG tablet Commonly known as: VANTIN   cetirizine 5 MG chewable tablet Commonly known as: ZYRTEC   triamcinolone 55 MCG/ACT Aero nasal inhaler Commonly known as: NASACORT       TAKE these medications    acetaminophen 500 MG tablet Commonly known as: TYLENOL Take 2 tablets (1,000 mg total) by mouth  every 6 (six) hours as needed for mild pain, fever or headache (or Fever >/= 101).   amLODipine 2.5 MG tablet Commonly known as: NORVASC Take 1 tablet (2.5 mg total) by mouth daily.   apixaban 5 MG Tabs tablet Commonly known as: ELIQUIS Take 1 tablet (5 mg total) by mouth 2 (two) times daily.   Aspirin 81 MG Caps Take by mouth.   atorvastatin 80 MG tablet Commonly known as:  LIPITOR Take 1 tablet (80 mg total) by mouth daily.   calcitRIOL 0.5 MCG capsule Commonly known as: ROCALTROL Take 1 capsule (0.5 mcg total) by mouth daily.   calcium acetate 667 MG capsule Commonly known as: PHOSLO Take 1 capsule (667 mg total) by mouth 3 (three) times daily with meals.   hydrALAZINE 25 MG tablet Commonly known as: APRESOLINE Take 1 tablet (25 mg total) by mouth 3 (three) times daily.   isosorbide dinitrate 10 MG tablet Commonly known as: ISORDIL Take 1 tablet (10 mg total) by mouth 3 (three) times daily.   metoprolol succinate 25 MG 24 hr tablet Commonly known as: TOPROL-XL Take 25 mg by mouth daily.   multivitamin Tabs tablet Take 1 tablet by mouth at bedtime.   ondansetron 4 MG disintegrating tablet Commonly known as: ZOFRAN-ODT Take 1 tablet (4 mg total) by mouth every 8 (eight) hours as needed for nausea or vomiting.   pantoprazole 40 MG tablet Commonly known as: Protonix Take 1 tablet (40 mg total) by mouth daily.   Renvela 800 MG tablet Generic drug: sevelamer carbonate Take 800 mg by mouth 3 (three) times daily with meals.   Velphoro 500 MG chewable tablet Generic drug: sucroferric oxyhydroxide Chew 1 tablet (500 mg total) by mouth 3 (three) times daily with meals.   Vitamin D (Ergocalciferol) 1.25 MG (50000 UNIT) Caps capsule Commonly known as: DRISDOL Take 1 capsule (50,000 Units total) by mouth every 7 (seven) days.        Follow-up Information     [facility] Go in 1 week(s).   Why: appt with Dr. Briant Sites 1 week at [facility] in [facility] information: [address] [place] [address] [phone]                No Known Allergies  Consultations: Cardio  Nephro    Procedures/Studies: DG Chest 2 View  Result Date: 08/17/2022 CLINICAL DATA:  Chest pain, dialysis patient EXAM: CHEST - 2 VIEW COMPARISON:  08/10/2022 chest radiograph. FINDINGS: Right internal jugular  dialysis catheter terminates over the right atrium. Intact sternotomy wires. Stable cardiomediastinal silhouette with normal heart size. No pneumothorax. No pleural effusion. Lungs appear clear, with no acute consolidative airspace disease and no pulmonary edema. IMPRESSION: No active cardiopulmonary disease. Electronically Signed   By: Delbert Phenix M.D.   On: 08/17/2022 09:04   CT ABDOMEN PELVIS W CONTRAST  Result Date: 08/10/2022 CLINICAL DATA:  Abdominal pain EXAM: CT ABDOMEN AND PELVIS WITH CONTRAST TECHNIQUE: Multidetector CT imaging of the abdomen and pelvis was performed using the standard protocol following bolus administration of intravenous contrast. RADIATION DOSE REDUCTION: This exam was performed according to the departmental dose-optimization program which includes automated exposure control, adjustment of the mA and/or kV according to patient size and/or use of iterative reconstruction technique. CONTRAST:  OMNIPAQUE IOHEXOL 300 MG/ML  SOLN COMPARISON:  Chest CTA dated April 19, 2022 FINDINGS: Lower chest: Left lower lobe subsegmental pulmonary embolus seen on series 2, image 4 which correlates with pulmonary  embolus seen on prior chest CTA dated March 30, 2022, appears more linear when compared with the prior. No new pulmonary artery filling defects. Hepatobiliary: Interval resolution of perihepatic fluid collection. Scattered low-attenuation liver lesions are unchanged when compared with the prior exam, lesions of the hepatic dome are consistent with benign hemangiomas. No suspicious liver lesions. Gallbladder is unremarkable. No biliary ductal dilation. Pancreas: Unremarkable. No pancreatic ductal dilatation or surrounding inflammatory changes. Spleen: Normal in size without focal abnormality. Adrenals/Urinary Tract: Bilateral adrenal glands are unremarkable. Atrophic kidneys. No hydronephrosis. Low-attenuation lesion of the left kidney which is likely simple cysts but too small to  accurately characterize, no specific follow-up imaging is necessary. Bladder wall thickening. Stomach/Bowel: Stomach is within normal limits. Normal appendix. No evidence of bowel wall thickening, distention, or inflammatory changes. Vascular/Lymphatic: Severe aortic atherosclerosis. No enlarged abdominal or pelvic lymph nodes. Reproductive: Unchanged mild prostatomegaly. Other: No abdominal wall hernia or abnormality. No abdominopelvic ascites. Musculoskeletal: Levocurvature of the lumbar spine. No aggressive appearing osseous lesion. IMPRESSION: 1. Interval resolution of right perihepatic fluid collection. 2. Bladder wall thickening, correlate with urinalysis to exclude cystitis. 3. Chronic left lower lobe pulmonary embolus. 4. Aortic Atherosclerosis (ICD10-I70.0). Electronically Signed   By: Allegra Lai M.D.   On: 08/10/2022 18:06   DG Chest Portable 1 View  Result Date: 08/10/2022 CLINICAL DATA:  Shortness of breath EXAM: PORTABLE CHEST 1 VIEW COMPARISON:  X-ray 04/26/2022 FINDINGS: Hyperinflation. No consolidation, pneumothorax or effusion. No edema. Sternal wires. Curvature of the spine. Normal cardiopericardial silhouette with a tortuous aorta. Stable double-lumen large-bore right IJ catheter with tip at the SVC right atrial junction. IMPRESSION: Postop chest with stable right IJ catheter. Hyperinflation. Electronically Signed   By: Karen Kays M.D.   On: 08/10/2022 17:18   (Echo, Carotid, EGD, Colonoscopy, ERCP)    Subjective: Pt denies any complaints    Discharge Exam: Vitals:   08/18/22 0325 08/18/22 0805  BP: 107/75 120/83  Pulse: 70 76  Resp: 17 20  Temp: 97.6 F (36.4 C) 98 F (36.7 C)  SpO2: 95% 96%   Vitals:   08/17/22 2240 08/17/22 2303 08/18/22 0325 08/18/22 0805  BP: 94/60 100/66 107/75 120/83  Pulse: 70 75 70 76  Resp: 18 16 17 20   Temp: 98.6 F (37 C)  97.6 F (36.4 C) 98 F (36.7 C)  TempSrc: Oral     SpO2: 93% 98% 95% 96%  Weight:  53.2 kg    Height:  5'  2" (1.575 m)      General: Pt is alert, awake, not in acute distress Cardiovascular: S1/S2 +, no rubs, no gallops Respiratory: CTA bilaterally, no wheezing, no rhonchi Abdominal: Soft, NT, ND, bowel sounds + Extremities: no cyanosis    The results of significant diagnostics from this hospitalization (including imaging, microbiology, ancillary and laboratory) are listed below for reference.     Microbiology: Recent Results (from the past 240 hour(s))  Urine Culture     Status: None   Collection Time: 08/10/22  4:29 PM   Specimen: Urine, Clean Catch  Result Value Ref Range Status   Specimen Description   Final    URINE, CLEAN CATCH Performed at [facility], [address], [place], [place] [address]    Special Requests   Final    NONE Performed at [facility], [address], [place], [place] [address]    Culture   Final    NO GROWTH Performed at [facility], [address]  [place], [place] [address]    Report Status 08/11/2022 FINAL  Final  MRSA Next Gen by PCR, Nasal     Status: None   Collection Time: 08/17/22 11:25 PM   Specimen: Nasal Mucosa; Nasal Swab  Result Value Ref Range Status   MRSA by PCR Next Gen NOT DETECTED NOT DETECTED Final    Comment: (NOTE) The GeneXpert MRSA Assay (FDA approved for NASAL specimens only), is one component of a comprehensive MRSA colonization surveillance program. It is not intended to diagnose MRSA infection nor to guide or monitor treatment for MRSA infections. Test performance is not FDA approved in patients less than 57 years old. Performed at [facility], [address] [place], [place] [address]      Labs: BNP (last 3 results) Recent Labs    04/18/22 2237  BNP 843.6*   Basic Metabolic Panel: Recent Labs  Lab 08/17/22 0832 08/18/22 0511  NA 137 139  K 4.8 3.8  CL 104 105  CO2 19* 23  GLUCOSE 79 105*  BUN 55* 34*  CREATININE 8.02* 5.60*  CALCIUM 7.4* 6.7*   Liver  Function Tests: Recent Labs  Lab 08/18/22 0511  AST 17  ALT 18  ALKPHOS 50  BILITOT 0.2*  PROT 5.7*  ALBUMIN 2.8*   No results for input(s): "LIPASE", "AMYLASE" in the last 168 hours. No results for input(s): "AMMONIA" in the last 168 hours. CBC: Recent Labs  Lab 08/17/22 0832 08/18/22 0511  WBC 13.5* 9.8  HGB 12.9* 12.0*  HCT 42.1 39.1  MCV 94.2 93.5  PLT 267 247   Cardiac Enzymes: No results for input(s): "CKTOTAL", "CKMB", "CKMBINDEX", "TROPONINI" in the last 168 hours. BNP: Invalid input(s): "POCBNP" CBG: No results for input(s): "GLUCAP" in the last 168 hours. D-Dimer No results for input(s): "DDIMER" in the last 72 hours. Hgb A1c No results for input(s): "HGBA1C" in the last 72 hours. Lipid Profile No results for input(s): "CHOL", "HDL", "LDLCALC", "TRIG", "CHOLHDL", "LDLDIRECT" in the last 72 hours. Thyroid function studies No results for input(s): "TSH", "T4TOTAL", "T3FREE", "THYROIDAB" in the last 72 hours.  Invalid input(s): "FREET3" Anemia work up No results for input(s): "VITAMINB12", "FOLATE", "FERRITIN", "TIBC", "IRON", "RETICCTPCT" in the last 72 hours. Urinalysis    Component Value Date/Time   COLORURINE YELLOW (A) 08/10/2022 1634   APPEARANCEUR TURBID (A) 08/10/2022 1634   APPEARANCEUR Cloudy (A) 05/08/2019 1448   LABSPEC 1.017 08/10/2022 1634   PHURINE 7.0 08/10/2022 1634   GLUCOSEU NEGATIVE 08/10/2022 1634   HGBUR SMALL (A) 08/10/2022 1634   BILIRUBINUR NEGATIVE 08/10/2022 1634   BILIRUBINUR Negative 05/08/2019 1448   KETONESUR NEGATIVE 08/10/2022 1634   PROTEINUR >=300 (A) 08/10/2022 1634   NITRITE NEGATIVE 08/10/2022 1634   LEUKOCYTESUR MODERATE (A) 08/10/2022 1634   Sepsis Labs Recent Labs  Lab 08/17/22 0832 08/18/22 0511  WBC 13.5* 9.8   Microbiology Recent Results (from the past 240 hour(s))  Urine Culture     Status: None   Collection Time: 08/10/22  4:29 PM   Specimen: Urine, Clean Catch  Result Value Ref Range Status    Specimen Description   Final    URINE, CLEAN CATCH Performed at [facility], [address] [place], [place] [address]    Special Requests   Final    NONE Performed at [facility], [address] [place], [place] [address]    Culture   Final    NO GROWTH Performed at [facility], [address], [place],  [place] [address]    Report Status 08/11/2022 FINAL  Final  MRSA Next Gen by PCR, Nasal     Status: None   Collection Time: 08/17/22 11:25 PM   Specimen: Nasal Mucosa; Nasal Swab  Result Value Ref Range Status   MRSA by PCR Next Gen NOT DETECTED NOT DETECTED Final    Comment: (NOTE) The GeneXpert MRSA Assay (FDA approved for NASAL specimens only), is one component of a comprehensive MRSA colonization surveillance program. It is not intended to diagnose MRSA infection nor to guide or monitor treatment for MRSA infections. Test performance is not FDA approved in patients less than 61 years old. Performed at [facility], [address] [place], [place] [address]      Time coordinating discharge: Over 30 minutes  SIGNED:   Charise Killian, MD  [facility] 08/18/2022, 1:37 PM Pager   If 7PM-7AM, please contact night-coverage

## 2022-08-18 NOTE — Consult Note (Signed)
[facility] CLINIC CARDIOLOGY CONSULT NOTE       Patient ID: Anthony Hess MRN: [id number] DOB/AGE: 65-09-1955 65 y.o.  Admit date: 08/17/2022 Referring Physician Dr. Fabienne Bruns Primary Physician Dr. Ronnald Ramp Primary Cardiologist Dr. Briant Sites  Reason for Consultation chest pain   HPI: Anthony Hess" Kracht is a 67yoM with a PMH of CAD s/p CABG x 4 (2020), HF recovered EF (55-60%, G1 DD 04/19/2022, prev 20-25%, g1DD, mod MR, mi-mod AR 2021), ESRD (T,TH,S HD), PAD, ongoing tobacco use, hx PE (eliquis), recent admission 3/11-15 for perforated duodenum w/ post-op intraabdominal abscess who presented to [facility] ED 08/17/2022 because of chest pain and severely elevated blood pressure during dialysis.  Cardiology is consulted for further assistance.  The patient is well known to our service from prior admissions. He has a cardiac history significant for 4 vessel bypass in 2020, HF with recovered EF, and a recently low to intermediate risk lexiscan myoview in May 2024 for which medical therapy was recommended. Per chart review, he presented to [facility] ED from dialysis after receiving ~20 mins of treatment with constant mild right-sided achy chest pain without radiation, and without nausea, diaphoresis, or dyspnea.  He also complained of a mild nonproductive cough and noted that he usually takes his blood pressure medications following dialysis.  He was extremely hypertensive with BP 194/121 in the emergency department and was also reportedly given clonidine at his dialysis facility.  Subsequently was given SL nitro, Nitropaste, and labetalol for BP control.  Labs are notable for an elevated but flat trending troponin at 138, 163, 123, 122. EKG showed sinus rhythm with ST depression in V5, and T wave inversions in 1 and aVL without significant change from prior from 6/18.  The patient underwent his regular dialysis yesterday, and overnight the patient wanted to leave AMA but was eventually  agreeable to stay for further evaluation in the morning.  At my time of evaluation the patient is laying flat in bed stating that he feels fine.  He tells me he is here in the hospital because "his blood pressure was low at dialysis." He adamantly denies having chest pain prior to dialysis yesterday, during dialysis, or since he has been in the hospital.  He denies shortness of breath, heart racing or palpitations, lightheadedness, orthopnea, or peripheral edema.  He denies missed dialysis sessions or changes in his medications recently. Reports ongoing tobacco use of 1/2 PPD but plans to quit on the 4th of July. He feels well and is eager to go home  Review of systems complete and found to be negative unless listed above     Past Medical History:  Diagnosis Date   Aortic atherosclerosis (HCC)    Bilateral carotid artery disease (HCC)    Bladder cancer (HCC)    Coronary artery disease 12/20/2018   a.) LHC 12/20/2018: 50% OM1, 40% OM2, 95% o-pLAD, 75% o=pLCx, 40% mLM, 70% D1, 60% mRCA-1, 50% mRCA-2; refer to CVTS. b.) 4v CABG at [facility] on 12/27/2018: LIMA-LAD, RIMA-PDA, seg LRA-OM1-D1   DCM (dilated cardiomyopathy) (HCC) 12/05/2018   a.) TTE 12/05/2018: EF 40-45%. b.) TTE 12/28/2019: EF 20-25%.   ESRD (end stage renal disease) (HCC)    a.) T-Th-Sat   HFrEF (heart failure with reduced ejection fraction) (HCC) 12/05/2018   a.) TTE 12/05/2018: EF 40-45%; mild LVH; ant/apical/sep HK; mild TR . b.) TTE 12/28/2019: EF 20-25%; mod LVH; mod MR/AR; G1DD.   History of 2019 novel coronavirus disease (COVID-19) 04/08/2021   History of kidney stones  HLD (hyperlipidemia)    Hx of CABG 12/27/2018   Hypertension    Infrarenal abdominal aortic aneurysm (AAA) without rupture (HCC) 03/05/2021   a.) CT abd/pelvis; measured 3.2 cm.   Melena 05/04/2022   Myocardial infarction [facility])    NSTEMI (non-ST elevated myocardial infarction) (HCC) 04/19/2022   Perforation bowel (HCC) 04/26/2022   PVD (peripheral  vascular disease) (HCC)    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   a.) LIMA-LAD, RIMA-PDA, sequential LEFT radial artery to OM1 and D1   Sepsis (HCC) 03/14/2021   Wears glasses     Past Surgical History:  Procedure Laterality Date   AV FISTULA PLACEMENT Left 07/30/2021   Procedure: INSERTION OF ARTERIOVENOUS (AV) GORE-TEX GRAFT ARM BRACHIAL ARTERY TO AXILLARY VEIN;  Surgeon: Annice Needy, MD;  Location: [facility] ORS;  Service: Vascular;  Laterality: Left;   CAPD INSERTION N/A 12/31/2019   Procedure: LAPAROSCOPIC INSERTION CONTINUOUS AMBULATORY PERITONEAL DIALYSIS  (CAPD) CATHETER;  Surgeon: Leafy Ro, MD;  Location: [facility] ORS;  Service: General;  Laterality: N/A;   CAPD REMOVAL N/A 04/10/2020   Procedure: LAPAROSCOPIC REVISION OF CONTINUOUS AMBULATORY PERITONEAL DIALYSIS  (CAPD) CATHETER;  Surgeon: Leafy Ro, MD;  Location: [facility] ORS;  Service: General;  Laterality: N/A;   CORONARY ARTERY BYPASS GRAFT N/A 12/27/2018   Procedure: CORONARY ARTERY BYPASS GRAFTING (CABG) X 4 ON PUMP USING RIGHT & LEFT INTERNAL MAMMARY ARTERY LEFT RADIAL ARTERY ENDOSCOPICALLY HARVESTED;  Surgeon: Linden Dolin, MD;  Location: [facility] OR;  Service: Open Heart Surgery;  Laterality: N/A;   CYSTOSCOPY W/ RETROGRADES Bilateral 05/15/2019   Procedure: CYSTOSCOPY WITH RETROGRADE PYELOGRAM;  Surgeon: Riki Altes, MD;  Location: [facility] ORS;  Service: Urology;  Laterality: Bilateral;   CYSTOSCOPY WITH BIOPSY N/A 05/15/2019   Procedure: CYSTOSCOPY WITH bladder BIOPSY;  Surgeon: Riki Altes, MD;  Location: [facility] ORS;  Service: Urology;  Laterality: N/A;   DIALYSIS/PERMA CATHETER INSERTION N/A 12/28/2019   Procedure: DIALYSIS/PERMA CATHETER INSERTION;  Surgeon: Annice Needy, MD;  Location: [facility] INVASIVE CV LAB;  Service: Cardiovascular;  Laterality: N/A;   DIALYSIS/PERMA CATHETER INSERTION N/A 03/18/2021   Procedure: DIALYSIS/PERMA CATHETER INSERTION;  Surgeon: Annice Needy, MD;  Location: [facility] INVASIVE CV LAB;  Service: Cardiovascular;   Laterality: N/A;   DIALYSIS/PERMA CATHETER REMOVAL N/A 06/02/2020   Procedure: DIALYSIS/PERMA CATHETER REMOVAL;  Surgeon: Annice Needy, MD;  Location: [facility] INVASIVE CV LAB;  Service: Cardiovascular;  Laterality: N/A;   EXCHANGE OF A DIALYSIS CATHETER Right 04/10/2020   Procedure: EXCHANGE OF A DIALYSIS CATHETER;  Surgeon: Leafy Ro, MD;  Location: [facility] OR[facility];  Service: General;  Laterality: Right;   INCISIONAL HERNIA REPAIR  01/20/2021   Procedure: HERNIA REPAIR INCISIONAL;  Surgeon: Henrene Dodge, MD;  Location: [facility] OR[facility];  Service: General;;   IR IMAGE GUIDED DRAINAGE PERCUT CATH  PERITONEAL RETROPERIT  04/07/2020   LAPAROSCOPY N/A 04/16/2021   Procedure: LAPAROSCOPY DIAGNOSTIC;  Surgeon: Sung Amabile, DO;  Location: [facility] ORS;  Service: General;  Laterality: N/A;   LAPAROTOMY N/A 04/26/2022   Procedure: EXPLORATORY LAPAROTOMY WITH REPAIR OF DUODENAL ULCER;  Surgeon: Carolan Shiver, MD;  Location: [facility] ORS;  Service: General;  Laterality: N/A;   LEFT HEART CATH AND CORONARY ANGIOGRAPHY Left 12/20/2018   Procedure: LEFT HEART CATH AND CORONARY ANGIOGRAPHY;  Surgeon: Marcina Millard, MD;  Location: [facility] IN[facility];  Service: Cardiovascular;  Laterality: Left;   RADIAL ARTERY HARVEST Left 12/27/2018   Procedure: ENDOSCOPIC RADIAL ARTERY HARVEST;  Surgeon: Linden Dolin, MD;  Location: [facility];  Service: Open Heart Surgery;  Laterality: Left;   REMOVAL OF A DIALYSIS CATHETER Left 03/20/2021   Procedure: REMOVAL OF A PD CATHETER;  Surgeon: Annice Needy, MD;  Location: [facility] OR[facility];  Service: Vascular;  Laterality: Left;   REVISION OF ARTERIOVENOUS GORETEX GRAFT Left 09/11/2021   Procedure: Excisionof infected AV graft;  Surgeon: Renford Dills, MD;  Location: [facility] ORS;  Service: Vascular;  Laterality: Left;   TEE WITHOUT CARDIOVERSION N/A 12/27/2018   Procedure: TRANSESOPHAGEAL ECHOCARDIOGRAM (TEE);  Surgeon: Linden Dolin, MD;  Location: [facility] OR;  Service: Open Heart Surgery;   Laterality: N/A;   TUMOR REMOVAL  2019   Bladder    Medications Prior to Admission  Medication Sig Dispense Refill Last Dose   acetaminophen (TYLENOL) 500 MG tablet Take 2 tablets (1,000 mg total) by mouth every 6 (six) hours as needed for mild pain, fever or headache (or Fever >/= 101). 30 tablet 0 08/16/2022   amLODipine (NORVASC) 2.5 MG tablet Take 1 tablet (2.5 mg total) by mouth daily. 30 tablet 0 08/16/2022   apixaban (ELIQUIS) 5 MG TABS tablet Take 1 tablet (5 mg total) by mouth 2 (two) times daily. 60 tablet 0 08/16/2022   Aspirin 81 MG CAPS Take by mouth.   08/16/2022   calcitRIOL (ROCALTROL) 0.5 MCG capsule Take 1 capsule (0.5 mcg total) by mouth daily. 30 capsule 0 08/16/2022   calcium acetate (PHOSLO) 667 MG capsule Take 1 capsule (667 mg total) by mouth 3 (three) times daily with meals. 30 capsule 0 08/16/2022   isosorbide dinitrate (ISORDIL) 10 MG tablet Take 1 tablet (10 mg total) by mouth 3 (three) times daily. 30 tablet 0 08/16/2022   metoprolol succinate (TOPROL-XL) 25 MG 24 hr tablet Take 25 mg by mouth daily.   08/16/2022   multivitamin (RENA-VIT) TABS tablet Take 1 tablet by mouth at bedtime. 30 tablet 1 08/16/2022   ondansetron (ZOFRAN-ODT) 4 MG disintegrating tablet Take 1 tablet (4 mg total) by mouth every 8 (eight) hours as needed for nausea or vomiting. 20 tablet 0 Past Month   sucroferric oxyhydroxide (VELPHORO) 500 MG chewable tablet Chew 1 tablet (500 mg total) by mouth 3 (three) times daily with meals. 90 tablet 0 08/16/2022   atorvastatin (LIPITOR) 80 MG tablet Take 1 tablet (80 mg total) by mouth daily. (Patient not taking: Reported on 08/17/2022)   Not Taking   [EXPIRED] cefpodoxime (VANTIN) 200 MG tablet Take 1 tablet (200 mg total) by mouth daily for 7 days. (Patient not taking: Reported on 08/17/2022) 7 tablet 0 Completed Course   cetirizine (ZYRTEC) 5 MG chewable tablet Chew 1 tablet (5 mg total) by mouth every Tuesday, Thursday, and Saturday at 6 PM. (Patient not taking:  Reported on 08/17/2022) 30 tablet 2 Not Taking   hydrALAZINE (APRESOLINE) 25 MG tablet Take 1 tablet (25 mg total) by mouth 3 (three) times daily. (Patient not taking: Reported on 08/17/2022) 90 tablet 0 Not Taking   pantoprazole (PROTONIX) 40 MG tablet Take 1 tablet (40 mg total) by mouth daily. 30 tablet 0    RENVELA 800 MG tablet Take 800 mg by mouth 3 (three) times daily with meals. (Patient not taking: Reported on 08/17/2022)   Not Taking   triamcinolone (NASACORT) 55 MCG/ACT AERO nasal inhaler Place 2 sprays into the nose daily. (Patient not taking: Reported on 08/17/2022) 1 each 12 Not Taking   Vitamin D, Ergocalciferol, (DRISDOL) 1.25 MG (50000 UNIT) CAPS capsule Take 1 capsule (50,000 Units  total) by mouth every 7 (seven) days. 5 capsule 0 08/14/2022   Social History   Socioeconomic History   Marital status: Married    Spouse name: Not on file   Number of children: Not on file   Years of education: Not on file   Highest education level: Not on file  Occupational History   Not on file  Tobacco Use   Smoking status: Every Day    Packs/day: .5    Types: Cigarettes    Last attempt to quit: 02/19/2019    Years since quitting: 3.4   Smokeless tobacco: Never  Vaping Use   Vaping Use: Never used  Substance and Sexual Activity   Alcohol use: Never   Drug use: Never   Sexual activity: Yes  Other Topics Concern   Not on file  Social History Narrative   Not on file   Social Determinants of Health   Financial Resource Strain: Not on file  Food Insecurity: No Food Insecurity (08/17/2022)   Hunger Vital Sign    Worried About Running Out of Food in the Last Year: Never true    Ran Out of Food in the Last Year: Never true  Transportation Needs: No Transportation Needs (08/17/2022)   PRAPARE - Administrator, Civil Service (Medical): No    Lack of Transportation (Non-Medical): No  Physical Activity: Not on file  Stress: Not on file  Social Connections: Not on file   Intimate Partner Violence: Not At Risk (08/17/2022)   Humiliation, Afraid, Rape, and Kick questionnaire    Fear of Current or Ex-Partner: No    Emotionally Abused: No    Physically Abused: No    Sexually Abused: No    Family History  Family history unknown: Yes      Intake/Output Summary (Last 24 hours) at 08/18/2022 0919 Last data filed at 08/17/2022 1740 Gross per 24 hour  Intake --  Output 1500 ml  Net -1500 ml    Vitals:   08/17/22 2240 08/17/22 2303 08/18/22 0325 08/18/22 0805  BP: 94/60 100/66 107/75 120/83  Pulse: 70 75 70 76  Resp: 18 16 17 20   Temp: 98.6 F (37 C)  97.6 F (36.4 C) 98 F (36.7 C)  TempSrc: Oral     SpO2: 93% 98% 95% 96%  Weight:  53.2 kg    Height:  5\' 2"  (1.575 m)      PHYSICAL EXAM General: Thin middle-aged Caucasian male, well nourished, in no acute distress.  Laying flat in bed. HEENT:  Normocephalic and atraumatic. Neck:  No JVD.  Lungs: Normal respiratory effort on room air.  Decreased breath sounds without appreciable crackles or wheezes.   Heart: HRRR . Normal S1 and S2 without gallops or murmurs.  Abdomen: Non-distended appearing.  Msk: Normal strength and tone for age. Extremities: Warm and well perfused. No clubbing, cyanosis.  No peripheral edema.  Neuro: Alert and oriented X 3. Psych:  Answers questions appropriately.   Labs: Basic Metabolic Panel: Recent Labs    08/17/22 0832 08/18/22 0511  NA 137 139  K 4.8 3.8  CL 104 105  CO2 19* 23  GLUCOSE 79 105*  BUN 55* 34*  CREATININE 8.02* 5.60*  CALCIUM 7.4* 6.7*   Liver Function Tests: Recent Labs    08/18/22 0511  AST 17  ALT 18  ALKPHOS 50  BILITOT 0.2*  PROT 5.7*  ALBUMIN 2.8*   No results for input(s): "LIPASE", "AMYLASE" in the last 72 hours. CBC: Recent  Labs    08/17/22 0832 08/18/22 0511  WBC 13.5* 9.8  HGB 12.9* 12.0*  HCT 42.1 39.1  MCV 94.2 93.5  PLT 267 247   Cardiac Enzymes: Recent Labs    08/17/22 1103 08/17/22 2106 08/17/22 2325   TROPONINIHS 163* 123* 122*   BNP: No results for input(s): "BNP" in the last 72 hours. D-Dimer: No results for input(s): "DDIMER" in the last 72 hours. Hemoglobin A1C: No results for input(s): "HGBA1C" in the last 72 hours. Fasting Lipid Panel: No results for input(s): "CHOL", "HDL", "LDLCALC", "TRIG", "CHOLHDL", "LDLDIRECT" in the last 72 hours. Thyroid Function Tests: No results for input(s): "TSH", "T4TOTAL", "T3FREE", "THYROIDAB" in the last 72 hours.  Invalid input(s): "FREET3" Anemia Panel: No results for input(s): "VITAMINB12", "FOLATE", "FERRITIN", "TIBC", "IRON", "RETICCTPCT" in the last 72 hours.   Radiology: DG Chest 2 View  Result Date: 08/17/2022 CLINICAL DATA:  Chest pain, dialysis patient EXAM: CHEST - 2 VIEW COMPARISON:  08/10/2022 chest radiograph. FINDINGS: Right internal jugular dialysis catheter terminates over the right atrium. Intact sternotomy wires. Stable cardiomediastinal silhouette with normal heart size. No pneumothorax. No pleural effusion. Lungs appear clear, with no acute consolidative airspace disease and no pulmonary edema. IMPRESSION: No active cardiopulmonary disease. Electronically Signed   By: Delbert Phenix M.D.   On: 08/17/2022 09:04   CT ABDOMEN PELVIS W CONTRAST  Result Date: 08/10/2022 CLINICAL DATA:  Abdominal pain EXAM: CT ABDOMEN AND PELVIS WITH CONTRAST TECHNIQUE: Multidetector CT imaging of the abdomen and pelvis was performed using the standard protocol following bolus administration of intravenous contrast. RADIATION DOSE REDUCTION: This exam was performed according to the departmental dose-optimization program which includes automated exposure control, adjustment of the mA and/or kV according to patient size and/or use of iterative reconstruction technique. CONTRAST:  OMNIPAQUE IOHEXOL 300 MG/ML  SOLN COMPARISON:  Chest CTA dated April 19, 2022 FINDINGS: Lower chest: Left lower lobe subsegmental pulmonary embolus seen on series 2,  image 4 which correlates with pulmonary embolus seen on prior chest CTA dated March 30, 2022, appears more linear when compared with the prior. No new pulmonary artery filling defects. Hepatobiliary: Interval resolution of perihepatic fluid collection. Scattered low-attenuation liver lesions are unchanged when compared with the prior exam, lesions of the hepatic dome are consistent with benign hemangiomas. No suspicious liver lesions. Gallbladder is unremarkable. No biliary ductal dilation. Pancreas: Unremarkable. No pancreatic ductal dilatation or surrounding inflammatory changes. Spleen: Normal in size without focal abnormality. Adrenals/Urinary Tract: Bilateral adrenal glands are unremarkable. Atrophic kidneys. No hydronephrosis. Low-attenuation lesion of the left kidney which is likely simple cysts but too small to accurately characterize, no specific follow-up imaging is necessary. Bladder wall thickening. Stomach/Bowel: Stomach is within normal limits. Normal appendix. No evidence of bowel wall thickening, distention, or inflammatory changes. Vascular/Lymphatic: Severe aortic atherosclerosis. No enlarged abdominal or pelvic lymph nodes. Reproductive: Unchanged mild prostatomegaly. Other: No abdominal wall hernia or abnormality. No abdominopelvic ascites. Musculoskeletal: Levocurvature of the lumbar spine. No aggressive appearing osseous lesion. IMPRESSION: 1. Interval resolution of right perihepatic fluid collection. 2. Bladder wall thickening, correlate with urinalysis to exclude cystitis. 3. Chronic left lower lobe pulmonary embolus. 4. Aortic Atherosclerosis (ICD10-I70.0). Electronically Signed   By: Allegra Lai M.D.   On: 08/10/2022 18:06   DG Chest Portable 1 View  Result Date: 08/10/2022 CLINICAL DATA:  Shortness of breath EXAM: PORTABLE CHEST 1 VIEW COMPARISON:  X-ray 04/26/2022 FINDINGS: Hyperinflation. No consolidation, pneumothorax or effusion. No edema. Sternal wires. Curvature of the  spine. Normal  cardiopericardial silhouette with a tortuous aorta. Stable double-lumen large-bore right IJ catheter with tip at the SVC right atrial junction. IMPRESSION: Postop chest with stable right IJ catheter. Hyperinflation. Electronically Signed   By: Karen Kays M.D.   On: 08/10/2022 17:18    Lexiscan myoview 07/06/2022 Borderline myocardial perfusion scan no evidence of  stress-induced myocardial ischemia is borderline reduced left ventricular  function around 45% this is a low to intermediate risk scan recommend  conservative medical therapy for now   ECHO 04/19/2022 1. Left ventricular ejection fraction, by estimation, is 55 to 60%. The  left ventricle has normal function. The left ventricle has no regional  wall motion abnormalities. There is moderate concentric left ventricular  hypertrophy. Left ventricular  diastolic parameters are consistent with Grade I diastolic dysfunction  (impaired relaxation).   2. Right ventricular systolic function is normal. The right ventricular  size is normal. There is normal pulmonary artery systolic pressure.   3. The mitral valve is normal in structure. No evidence of mitral valve  regurgitation.   4. The aortic valve is tricuspid. Aortic valve regurgitation is not  visualized. Aortic valve sclerosis/calcification is present, without any  evidence of aortic stenosis.   5. The inferior vena cava is normal in size with greater than 50%  respiratory variability, suggesting right atrial pressure of 3 mmHg.   TELEMETRY reviewed by me (LT) 08/18/2022 : NSR rates 70-80s with occasional premature atrial contractions, without evidence of atrial fibrillation.  Very small P waves present  EKG reviewed by me: sinus rhythm with ST depression in V5, and T wave inversions in 1 and aVL without significant change from prior from 6/18.   Data reviewed by me (LT) 08/18/2022: ED note, admission H&P, nursing notes, nephrology note last 24h vitals tele labs imaging  I/O   Active Problems:   Atrial fibrillation (HCC)   End stage renal disease (HCC)   Chronic systolic heart failure (HCC)   Hyperlipidemia, mixed   Anemia in chronic kidney disease (CODE)   Hypertensive urgency   Acute pulmonary embolism (HCC)   NSTEMI (non-ST elevated myocardial infarction) (HCC)   (HFpEF) heart failure with preserved ejection fraction (HCC)    ASSESSMENT AND PLAN:  Anthony Hess is a [phone] with a PMH of CAD s/p CABG x 4 (2020), HF recovered EF (55-60%, G1 DD 04/19/2022, prev 20-25%, g1DD, mod MR, mi-mod AR 2021), ESRD (T,TH,S HD), PAD, ongoing tobacco use, hx PE (eliquis), recent admission 3/11-15 for perforated duodenum w/ post-op intraabdominal abscess who presented to [facility] ED 08/17/2022 because of chest pain and severely elevated blood pressure during dialysis.  Cardiology is consulted for further assistance.  # Hypertensive urgency # Demand ischemia # CAD s/p CABG x 4 without chest pain # Chronic HFpEF Presents from dialysis with extremely elevated blood pressure to 190/120s and initial reports of constant mild right-sided chest discomfort without radiation or associated symptoms.  During my visit this morning the patient adamantly denies ever having chest pain or any concerning symptoms prompting his presentation other than "low blood pressure at dialysis."  His troponins are elevated and flat trending in the 100s, and EKG is without acute changes compared to prior from June 18.  This troponin elevation is most consistent with demand ischemia in the setting of hypertensive urgency and not ACS.  He appears euvolemic on exam, volume is managed with dialysis -Agree with current therapy per primary team -Continue aspirin 81 mg daily -Continue atorvastatin 40 mg daily -Continue home antihypertensives: Metoprolol  XL 25 mg daily, hydralazine 25 mg 3 times daily, Isordil 10 mg 3 times daily, amlodipine 25 mg daily -Defer additional cardiac diagnostics at this time  #  ESRD -HD on Tuesday, Thursdays, and Saturdays  # Tobacco abuse Previously smoked 2 packs/day, quit for a while, but restarted smoking 1/2 pack/day.  Advised complete cessation, patient plans to quit entirely on July 4.  # History of PE -Eliquis 5 mg twice daily  Ok for discharge today from a cardiac perspective. Will arrange for follow up in clinic with Dr. Briant Sites at [facility] in 1-2 weeks.    This patient's plan of care was discussed and created with Dr. Juliann Pares and he is in agreement.  Signed: Rebeca Allegra , PA-C 08/18/2022, 9:19 AM [facility]

## 2022-08-18 NOTE — Plan of Care (Signed)
Pt A&OX4, no c/o chest pain. BP 120/83 (BP Location: Right Arm)   Pulse 76   Temp 98 F (36.7 C)   Resp 20   Ht 5\' 2"  (1.575 m)   Wt 53.2 kg   SpO2 96%   BMI 21.45 kg/m  All patients belongings returned. AVS reviewed and questions answered. IV and Tele removed. Pt escorted off floor by staff. Reva Bores 08/18/22 1:40 PM

## 2022-08-18 NOTE — Progress Notes (Addendum)
       CROSS COVER NOTE  NAME: Anthony Hess MRN: [id number] DOB : 1955-03-30    Concern as stated by nurse / staff   Anthony Hess [id number] (attached to this note) left AMA after he had his HD. He signed AMA papers and appeared in no distress   (While speaking with nurse, patient returned to the ED stating he developed chest pain while in the parking lot of the hospital)   Pertinent findings on chart review: H&P reviewed: Patient with ESRD, CAD status post CABG, hypertensionadmitted earlier with "intermittent atypical chest pain with past 24 hours troponin in the 130s...suspect demand ischemia in the setting of volume overload with partial hemodialysis today, elevated BP" EKG stable as compared to the prior   Physical exam On my arrival, patient lying comfortably in the bed and he is denying chest pain Vitals unremarkable Physical Exam Vitals and nursing note reviewed.  Constitutional:      General: He is not in acute distress. HENT:     Head: Normocephalic and atraumatic.  Cardiovascular:     Rate and Rhythm: Normal rate and regular rhythm.     Heart sounds: Normal heart sounds.  Pulmonary:     Effort: Pulmonary effort is normal.     Breath sounds: Normal breath sounds.  Abdominal:     Palpations: Abdomen is soft.     Tenderness: There is no abdominal tenderness.  Neurological:     Mental Status: Mental status is at baseline.      Assessment and  Interventions   Assessment:  Chest pain secondary to demand ischemia in the setting of fluid overload, now s/p dialysis and chest pain free  Attempted AMA, returned to the hospital due to symptom recurrence  Plan: Continue current management per H&P which includes cardiology consult and echocardiogram for in the a.m. Extensive discussion with patient on the importance of staying in the hospital to complete workup and he appears agreeable X

## 2022-08-19 ENCOUNTER — Telehealth: Payer: Self-pay

## 2022-08-19 NOTE — Transitions of Care (Post Inpatient/ED Visit) (Signed)
   08/19/2022  Name: Anthony Hess MRN: [id number] DOB: 1955-07-13  Today's TOC FU Call Status: Today's TOC FU Call Status:: Unsuccessul Call (1st Attempt) Unsuccessful Call (1st Attempt) Date: 08/19/22  Attempted to reach the patient regarding the most recent Inpatient/ED visit.  Follow Up Plan: Additional outreach attempts will be made to reach the patient to complete the Transitions of Care (Post Inpatient/ED visit) call.   Signature Karena Addison, LPN [facility] Nurse Health Advisor Direct Dial [phone]

## 2022-08-23 ENCOUNTER — Ambulatory Visit (INDEPENDENT_AMBULATORY_CARE_PROVIDER_SITE_OTHER): Payer: Medicare Other

## 2022-08-23 VITALS — Ht 62.0 in | Wt 117.0 lb

## 2022-08-23 DIAGNOSIS — Z0100 Encounter for examination of eyes and vision without abnormal findings: Secondary | ICD-10-CM

## 2022-08-23 DIAGNOSIS — Z0000 Encounter for general adult medical examination without abnormal findings: Secondary | ICD-10-CM

## 2022-08-23 DIAGNOSIS — Z122 Encounter for screening for malignant neoplasm of respiratory organs: Secondary | ICD-10-CM

## 2022-08-23 DIAGNOSIS — Z1211 Encounter for screening for malignant neoplasm of colon: Secondary | ICD-10-CM

## 2022-08-23 NOTE — Progress Notes (Signed)
Subjective:   Anthony Hess is a 67 y.o. male who presents for an Initial Medicare Annual Wellness Visit.  Visit Complete: Virtual  I connected with  Anthony Hess on 08/23/22 by a audio enabled telemedicine application and verified that I am speaking with the correct person using two identifiers.  Patient Location: Home  Provider Location: Office/Clinic  I discussed the limitations of evaluation and management by telemedicine. The patient expressed understanding and agreed to proceed.  Patient Medicare AWV questionnaire was completed by the patient on (not done); I have confirmed that all information answered by patient is correct and no changes since this date.  Review of Systems    Cardiac Risk Factors include: advanced age (>17men, >55 women);dyslipidemia;hypertension;male gender;smoking/ tobacco exposure    Objective:    Today's Vitals   08/23/22 0917  Weight: 117 lb (53.1 kg)  Height: 5\' 2"  (1.575 m)   Body mass index is 21.4 kg/m.     08/23/2022    9:36 AM 08/18/2022    1:39 PM 08/17/2022   11:00 AM 08/17/2022    8:30 AM 08/10/2022    4:32 PM 05/06/2022    5:47 AM 05/05/2022    1:36 PM  Advanced Directives  Does Patient Have a Medical Advance Directive? No No No No No  No  Would patient like information on creating a medical advance directive?  No - Patient declined No - Patient declined  No - Patient declined No - Patient declined     Current Medications (verified) Outpatient Encounter Medications as of 08/23/2022  Medication Sig   acetaminophen (TYLENOL) 500 MG tablet Take 2 tablets (1,000 mg total) by mouth every 6 (six) hours as needed for mild pain, fever or headache (or Fever >/= 101).   amLODipine (NORVASC) 2.5 MG tablet Take 1 tablet (2.5 mg total) by mouth daily.   apixaban (ELIQUIS) 5 MG TABS tablet Take 1 tablet (5 mg total) by mouth 2 (two) times daily.   Aspirin 81 MG CAPS Take by mouth.   calcitRIOL (ROCALTROL) 0.5 MCG capsule Take 1 capsule (0.5 mcg  total) by mouth daily.   calcium acetate (PHOSLO) 667 MG capsule Take 1 capsule (667 mg total) by mouth 3 (three) times daily with meals.   isosorbide dinitrate (ISORDIL) 10 MG tablet Take 1 tablet (10 mg total) by mouth 3 (three) times daily.   metoprolol succinate (TOPROL-XL) 25 MG 24 hr tablet Take 25 mg by mouth daily.   multivitamin (RENA-VIT) TABS tablet Take 1 tablet by mouth at bedtime.   ondansetron (ZOFRAN-ODT) 4 MG disintegrating tablet Take 1 tablet (4 mg total) by mouth every 8 (eight) hours as needed for nausea or vomiting.   Vitamin D, Ergocalciferol, (DRISDOL) 1.25 MG (50000 UNIT) CAPS capsule Take 1 capsule (50,000 Units total) by mouth every 7 (seven) days.   atorvastatin (LIPITOR) 80 MG tablet Take 1 tablet (80 mg total) by mouth daily. (Patient not taking: Reported on 08/17/2022)   hydrALAZINE (APRESOLINE) 25 MG tablet Take 1 tablet (25 mg total) by mouth 3 (three) times daily. (Patient not taking: Reported on 08/17/2022)   pantoprazole (PROTONIX) 40 MG tablet Take 1 tablet (40 mg total) by mouth daily.   RENVELA 800 MG tablet Take 800 mg by mouth 3 (three) times daily with meals. (Patient not taking: Reported on 08/17/2022)   sucroferric oxyhydroxide (VELPHORO) 500 MG chewable tablet Chew 1 tablet (500 mg total) by mouth 3 (three) times daily with meals. (Patient not taking: Reported on 08/23/2022)  No facility-administered encounter medications on file as of 08/23/2022.    Allergies (verified) Patient has no known allergies.   History: Past Medical History:  Diagnosis Date   Aortic atherosclerosis (HCC)    Bilateral carotid artery disease (HCC)    Bladder cancer (HCC)    Coronary artery disease 12/20/2018   a.) LHC 12/20/2018: 50% OM1, 40% OM2, 95% o-pLAD, 75% o=pLCx, 40% mLM, 70% D1, 60% mRCA-1, 50% mRCA-2; refer to CVTS. b.) 4v CABG at [facility] on 12/27/2018: LIMA-LAD, RIMA-PDA, seg LRA-OM1-D1   DCM (dilated cardiomyopathy) (HCC) 12/05/2018   a.) TTE 12/05/2018: EF 40-45%.  b.) TTE 12/28/2019: EF 20-25%.   ESRD (end stage renal disease) (HCC)    a.) T-Th-Sat   HFrEF (heart failure with reduced ejection fraction) (HCC) 12/05/2018   a.) TTE 12/05/2018: EF 40-45%; mild LVH; ant/apical/sep HK; mild TR . b.) TTE 12/28/2019: EF 20-25%; mod LVH; mod MR/AR; G1DD.   History of 2019 novel coronavirus disease (COVID-19) 04/08/2021   History of kidney stones    HLD (hyperlipidemia)    Hx of CABG 12/27/2018   Hypertension    Infrarenal abdominal aortic aneurysm (AAA) without rupture (HCC) 03/05/2021   a.) CT abd/pelvis; measured 3.2 cm.   Melena 05/04/2022   Myocardial infarction [facility])    NSTEMI (non-ST elevated myocardial infarction) (HCC) 04/19/2022   Perforation bowel (HCC) 04/26/2022   PVD (peripheral vascular disease) (HCC)    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   a.) LIMA-LAD, RIMA-PDA, sequential LEFT radial artery to OM1 and D1   Sepsis (HCC) 03/14/2021   Wears glasses    Past Surgical History:  Procedure Laterality Date   AV FISTULA PLACEMENT Left 07/30/2021   Procedure: INSERTION OF ARTERIOVENOUS (AV) GORE-TEX GRAFT ARM BRACHIAL ARTERY TO AXILLARY VEIN;  Surgeon: Annice Needy, MD;  Location: [facility] ORS;  Service: Vascular;  Laterality: Left;   CAPD INSERTION N/A 12/31/2019   Procedure: LAPAROSCOPIC INSERTION CONTINUOUS AMBULATORY PERITONEAL DIALYSIS  (CAPD) CATHETER;  Surgeon: Leafy Ro, MD;  Location: [facility] ORS;  Service: General;  Laterality: N/A;   CAPD REMOVAL N/A 04/10/2020   Procedure: LAPAROSCOPIC REVISION OF CONTINUOUS AMBULATORY PERITONEAL DIALYSIS  (CAPD) CATHETER;  Surgeon: Leafy Ro, MD;  Location: [facility] ORS;  Service: General;  Laterality: N/A;   CORONARY ARTERY BYPASS GRAFT N/A 12/27/2018   Procedure: CORONARY ARTERY BYPASS GRAFTING (CABG) X 4 ON PUMP USING RIGHT & LEFT INTERNAL MAMMARY ARTERY LEFT RADIAL ARTERY ENDOSCOPICALLY HARVESTED;  Surgeon: Linden Dolin, MD;  Location: [facility];  Service: Open Heart Surgery;  Laterality: N/A;   CYSTOSCOPY W/  RETROGRADES Bilateral 05/15/2019   Procedure: CYSTOSCOPY WITH RETROGRADE PYELOGRAM;  Surgeon: Riki Altes, MD;  Location: [facility] ORS;  Service: Urology;  Laterality: Bilateral;   CYSTOSCOPY WITH BIOPSY N/A 05/15/2019   Procedure: CYSTOSCOPY WITH bladder BIOPSY;  Surgeon: Riki Altes, MD;  Location: [facility] ORS;  Service: Urology;  Laterality: N/A;   DIALYSIS/PERMA CATHETER INSERTION N/A 12/28/2019   Procedure: DIALYSIS/PERMA CATHETER INSERTION;  Surgeon: Annice Needy, MD;  Location: [facility];  Service: Cardiovascular;  Laterality: N/A;   DIALYSIS/PERMA CATHETER INSERTION N/A 03/18/2021   Procedure: DIALYSIS/PERMA CATHETER INSERTION;  Surgeon: Annice Needy, MD;  Location: [facility];  Service: Cardiovascular;  Laterality: N/A;   DIALYSIS/PERMA CATHETER REMOVAL N/A 06/02/2020   Procedure: DIALYSIS/PERMA CATHETER REMOVAL;  Surgeon: Annice Needy, MD;  Location: [facility];  Service: Cardiovascular;  Laterality: N/A;   EXCHANGE OF A DIALYSIS CATHETER Right 04/10/2020  Procedure: EXCHANGE OF A DIALYSIS CATHETER;  Surgeon: Leafy Ro, MD;  Location: [facility];  Service: General;  Laterality: Right;   INCISIONAL HERNIA REPAIR  01/20/2021   Procedure: HERNIA REPAIR INCISIONAL;  Surgeon: Henrene Dodge, MD;  Location: [facility] OR[facility];  Service: General;;   IR IMAGE GUIDED DRAINAGE PERCUT CATH  PERITONEAL RETROPERIT  04/07/2020   LAPAROSCOPY N/A 04/16/2021   Procedure: LAPAROSCOPY DIAGNOSTIC;  Surgeon: Sung Amabile, DO;  Location: [facility] OR[facility];  Service: General;  Laterality: N/A;   LAPAROTOMY N/A 04/26/2022   Procedure: EXPLORATORY LAPAROTOMY WITH REPAIR OF DUODENAL ULCER;  Surgeon: Carolan Shiver, MD;  Location: [facility];  Service: General;  Laterality: N/A;   LEFT HEART CATH AND CORONARY ANGIOGRAPHY Left 12/20/2018   Procedure: LEFT HEART CATH AND CORONARY ANGIOGRAPHY;  Surgeon: Marcina Millard, MD;  Location: [facility];  Service: Cardiovascular;  Laterality:  Left;   RADIAL ARTERY HARVEST Left 12/27/2018   Procedure: ENDOSCOPIC RADIAL ARTERY HARVEST;  Surgeon: Linden Dolin, MD;  Location: [facility];  Service: Open Heart Surgery;  Laterality: Left;   REMOVAL OF A DIALYSIS CATHETER Left 03/20/2021   Procedure: REMOVAL OF A PD CATHETER;  Surgeon: Annice Needy, MD;  Location: [facility];  Service: Vascular;  Laterality: Left;   REVISION OF ARTERIOVENOUS GORETEX GRAFT Left 09/11/2021   Procedure: Excisionof infected AV graft;  Surgeon: Renford Dills, MD;  Location: [facility];  Service: Vascular;  Laterality: Left;   TEE WITHOUT CARDIOVERSION N/A 12/27/2018   Procedure: TRANSESOPHAGEAL ECHOCARDIOGRAM (TEE);  Surgeon: Linden Dolin, MD;  Location: [facility] OR;  Service: Open Heart Surgery;  Laterality: N/A;   TUMOR REMOVAL  2019   Bladder   Family History  Family history unknown: Yes   Social History   Socioeconomic History   Marital status: Married    Spouse name: Not on file   Number of children: Not on file   Years of education: Not on file   Highest education level: Not on file  Occupational History   Not on file  Tobacco Use   Smoking status: Every Day    Packs/day: .5    Types: Cigarettes    Last attempt to quit: 02/19/2019    Years since quitting: 3.5   Smokeless tobacco: Never  Vaping Use   Vaping Use: Never used  Substance and Sexual Activity   Alcohol use: Never   Drug use: Never   Sexual activity: Yes  Other Topics Concern   Not on file  Social History Narrative   Not on file   Social Determinants of Health   Financial Resource Strain: Low Risk  (08/23/2022)   Overall Financial Resource Strain (CARDIA)    Difficulty of Paying Living Expenses: Not very hard  Food Insecurity: No Food Insecurity (08/23/2022)   Hunger Vital Sign    Worried About Running Out of Food in the Last Year: Never true    Ran Out of Food in the Last Year: Never true  Transportation Needs: No Transportation Needs (08/23/2022)   PRAPARE - Therapist, art (Medical): No    Lack of Transportation (Non-Medical): No  Physical Activity: Inactive (08/23/2022)   Exercise Vital Sign    Days of Exercise per Week: 0 days    Minutes of Exercise per Session: 0 min  Stress: No Stress Concern Present (08/23/2022)   [employer] of Occupational Health - Occupational Stress Questionnaire    Feeling of Stress : Not at all  Social Connections:  Moderately Integrated (08/23/2022)   Social Connection and Isolation Panel [NHANES]    Frequency of Communication with Friends and Family: More than three times a week    Frequency of Social Gatherings with Friends and Family: Never    Attends Religious Services: More than 4 times per year    Active Member of [employer] or Organizations: No    Attends Engineer, structural: Never    Marital Status: Married    Tobacco Counseling Ready to quit: Not Answered Counseling given: Not Answered   Clinical Intake:  Pre-visit preparation completed: Yes  Pain : No/denies pain     BMI - recorded: 21.4 Nutritional Status: BMI of 19-24  Normal Nutritional Risks: None Diabetes: No  How often do you need to have someone help you when you read instructions, pamphlets, or other written materials from your doctor or pharmacy?: 1 - Never  Interpreter Needed?: No  Comments: lives with wife Information entered by :: B.Tess Potts,LPN   Activities of Daily Living    08/23/2022    9:37 AM 08/18/2022    1:38 PM  In your present state of health, do you have any difficulty performing the following activities:  Hearing? 0   Vision? 0   Difficulty concentrating or making decisions? 0   Walking or climbing stairs? 0   Dressing or bathing? 0   Doing errands, shopping? 0 0  Preparing Food and eating ? N   Using the Toilet? N   In the past six months, have you accidently leaked urine? N   Do you have problems with loss of bowel control? N   Managing your Medications? N   Managing your Finances?  N   Housekeeping or managing your Housekeeping? N     Patient Care Team: Ronnald Ramp, MD as PCP - General (Family Medicine)  Indicate any recent Medical Services you may have received from other than [facility] providers in the past year (date may be approximate).     Assessment:   This is a routine wellness examination for Anthony Hess.  Hearing/Vision screen Hearing Screening - Comments:: Adequate hearing Vision Screening - Comments:: Adequate vision w/glasses [facility]   Dietary issues and exercise activities discussed:     Goals Addressed             This Visit's Progress    DIET - EAT MORE FRUITS AND VEGETABLES       Quit Smoking         Depression Screen    08/23/2022    9:29 AM 06/25/2022    1:10 PM 01/11/2020    1:14 PM 03/20/2018    2:36 PM  PHQ 2/9 Scores  PHQ - 2 Score 0 0 0 0  PHQ- 9 Score  0  3    Fall Risk    08/23/2022    9:24 AM 06/25/2022    1:10 PM 02/27/2020    9:00 AM 01/16/2020    2:15 PM 03/20/2018    2:36 PM  Fall Risk   Falls in the past year? 1 0 0 0 0  Comment fell out of bed      Number falls in past yr: 0 0   0  Injury with Fall? 0 0   0  Risk for fall due to : No Fall Risks      Follow up Education provided;Falls prevention discussed        MEDICARE RISK AT HOME:  Medicare Risk at Home - 08/23/22 2956  Any stairs in or around the home? Yes    If so, are there any without handrails? Yes    Home free of loose throw rugs in walkways, pet beds, electrical cords, etc? Yes    Adequate lighting in your home to reduce risk of falls? Yes    Life alert? No    Use of a cane, walker or w/c? No    Grab bars in the bathroom? No    Shower chair or bench in shower? No    Elevated toilet seat or a handicapped toilet? No             TIMED UP AND GO:  Was the test performed? No    Cognitive Function:        08/23/2022    9:40 AM  6CIT Screen  What Year? 0 points  What month? 0 points  What time? 0 points  Count back  from 20 0 points  Months in reverse 0 points  Repeat phrase 0 points  Total Score 0 points    Immunizations Immunization History  Administered Date(s) Administered   Influenza-Unspecified 10/24/2019   PFIZER Comirnaty(Gray Top)Covid-19 Tri-Sucrose Vaccine 06/12/2019, 01/02/2020   PFIZER(Purple Top)SARS-COV-2 Vaccination 05/22/2019   Pneumococcal Polysaccharide-23 01/05/2019    TDAP status: Up to date  Flu Vaccine status: Up to date  Pneumococcal vaccine status: Up to date  Covid-19 vaccine status: Completed vaccines  Qualifies for Shingles Vaccine? Yes   Zostavax completed No   Shingrix Completed?: No.    Education has been provided regarding the importance of this vaccine. Patient has been advised to call insurance company to determine out of pocket expense if they have not yet received this vaccine. Advised may also receive vaccine at local pharmacy or Health Dept. Verbalized acceptance and understanding.  Screening Tests Health Maintenance  Topic Date Due   DTaP/Tdap/Td (1 - Tdap) Never done   Zoster Vaccines- Shingrix (1 of 2) Never done   Colonoscopy  Never done   COVID-19 Vaccine (5 - 2023-24 season) 10/23/2021   INFLUENZA VACCINE  09/23/2022   Medicare Annual Wellness (AWV)  08/23/2023   Hepatitis C Screening  Completed   HIV Screening  Completed   HPV VACCINES  Aged Out    Health Maintenance  Health Maintenance Due  Topic Date Due   DTaP/Tdap/Td (1 - Tdap) Never done   Zoster Vaccines- Shingrix (1 of 2) Never done   Colonoscopy  Never done   COVID-19 Vaccine (5 - 2023-24 season) 10/23/2021    Colorectal cancer screening: Type of screening: Cologuard. Completed no. Repeat every 3 years ORDERED  Lung Cancer Screening: (Low Dose CT Chest recommended if Age 55-80 years, 20 pack-year currently smoking OR have quit w/in 15years.) does qualify.   Lung Cancer Screening Referral: yes  Additional Screening:  Hepatitis C Screening: does not qualify; Completed  yes  Vision Screening: Recommended annual ophthalmology exams for early detection of glaucoma and other disorders of the eye. Is the patient up to date with their annual eye exam?  Yes  Who is the provider or what is the name of the office in which the patient attends annual eye exams? [facility] If pt is not established with a provider, would they like to be referred to a provider to establish care? No .   Dental Screening: Recommended annual dental exams for proper oral hygiene  Diabetic Foot Exam: n/a  Community Resource Referral / Chronic Care Management: CRR required this visit?  No   CCM  required this visit?  Appt made with PCP    Plan:     I have personally reviewed and noted the following in the patient's chart:   Medical and social history Use of alcohol, tobacco or illicit drugs  Current medications and supplements including opioid prescriptions. Patient is not currently taking opioid prescriptions. Functional ability and status Nutritional status Physical activity Advanced directives List of other physicians Hospitalizations, surgeries, and ER visits in previous 12 months Vitals Screenings to include cognitive, depression, and falls Referrals and appointments  In addition, I have reviewed and discussed with patient certain preventive protocols, quality metrics, and best practice recommendations. A written personalized care plan for preventive services as well as general preventive health recommendations were provided to patient.     Sue Lush, LPN   09/22/1912   After Visit Summary: (Declined) Due to this being a telephonic visit, with patients personalized plan was offered to patient but patient Declined AVS at this time   Nurse Notes: The patient states he is doing well. He and his wife expresses their need to re-evaluate his BP meds. His wife relays his BP dropped to 70/40 one day after dialysis. She relays his medications keep getting changed and he  needs for stability in blood pressures. Appt made with PCP (first available 09/02/22 @ 2:40pm) They have no concerns or questions at this time.  *referral to Pulmonology for Lung Cancer Screening *referral to Ophthalmology  *Cologard ordered

## 2022-08-23 NOTE — Transitions of Care (Post Inpatient/ED Visit) (Signed)
08/23/2022  Name: Anthony Hess MRN: [id number] DOB: 08-18-1955  Today's TOC FU Call Status: Today's TOC FU Call Status:: Successful TOC FU Call Competed Unsuccessful Call (1st Attempt) Date: 08/19/22 [facility] FU Call Complete Date: 08/23/22  Transition Care Management Follow-up Telephone Call Date of Discharge: 08/18/22 Discharge Facility: [facility]) Type of Discharge: Inpatient Admission Primary Inpatient Discharge Diagnosis:: hypertension How have you been since you were released from the hospital?: Better Any questions or concerns?: No  Items Reviewed: Did you receive and understand the discharge instructions provided?: Yes Medications obtained,verified, and reconciled?: Yes (Medications Reviewed) Any new allergies since your discharge?: No Dietary orders reviewed?: Yes Do you have support at home?: Yes People in Home: spouse  Medications Reviewed Today: Medications Reviewed Today     Reviewed by Karena Addison, LPN (Licensed Practical Nurse) on 08/23/22 at 1659  Med List Status: <None>   Medication Order Taking? Sig Documenting Provider Last Dose Status Informant  acetaminophen (TYLENOL) 500 MG tablet [id number] No Take 2 tablets (1,000 mg total) by mouth every 6 (six) hours as needed for mild pain, fever or headache (or Fever >/= 101). Sunnie Nielsen, DO Taking Active Self  amLODipine (NORVASC) 2.5 MG tablet [id number] No Take 1 tablet (2.5 mg total) by mouth daily. Alford Highland, MD Taking Active Self  apixaban (ELIQUIS) 5 MG TABS tablet [id number] No Take 1 tablet (5 mg total) by mouth 2 (two) times daily. Baron Hamper, MD Taking Active Self  Aspirin 81 MG CAPS [id number] No Take by mouth. [provider] Taking Active Self  atorvastatin (LIPITOR) 80 MG tablet [id number] No Take 1 tablet (80 mg total) by mouth daily.  Patient not taking: Reported on 08/17/2022   Lurene Shadow, MD Not Taking Active Self  calcitRIOL (ROCALTROL) 0.5  MCG capsule [id number] No Take 1 capsule (0.5 mcg total) by mouth daily. Sunnie Nielsen, DO Taking Active Self  calcium acetate (PHOSLO) 667 MG capsule [id number] No Take 1 capsule (667 mg total) by mouth 3 (three) times daily with meals. Sunnie Nielsen, DO Taking Active Self  hydrALAZINE (APRESOLINE) 25 MG tablet [id number] No Take 1 tablet (25 mg total) by mouth 3 (three) times daily.  Patient not taking: Reported on 08/17/2022   Lurene Shadow, MD Not Taking Active Self  isosorbide dinitrate (ISORDIL) 10 MG tablet [id number] No Take 1 tablet (10 mg total) by mouth 3 (three) times daily. Lurene Shadow, MD Taking Active Self  metoprolol succinate (TOPROL-XL) 25 MG 24 hr tablet [id number] No Take 25 mg by mouth daily. [provider] Taking Active Self  multivitamin (RENA-VIT) TABS tablet [id number] No Take 1 tablet by mouth at bedtime. Pennie Banter, DO Taking Active Self  ondansetron (ZOFRAN-ODT) 4 MG disintegrating tablet [id number] No Take 1 tablet (4 mg total) by mouth every 8 (eight) hours as needed for nausea or vomiting. Simmons-Robinson, Makiera, MD Taking Active Self  pantoprazole (PROTONIX) 40 MG tablet [id number]  Take 1 tablet (40 mg total) by mouth daily. Alford Highland, MD  Expired 06/06/22 2359   RENVELA 800 MG tablet [id number] No Take 800 mg by mouth 3 (three) times daily with meals.  Patient not taking: Reported on 08/17/2022   [provider] Not Taking Active Self  sucroferric oxyhydroxide (VELPHORO) 500 MG chewable tablet [id number] No Chew 1 tablet (500 mg total) by mouth 3 (three) times daily with meals.  Patient not taking: Reported on 08/23/2022   Sunnie Nielsen, DO Not Taking Active Self  Med Note Aldean Ast Apr 19, 2022  2:47 AM)    Vitamin D, Ergocalciferol, (DRISDOL) 1.25 MG (50000 UNIT) CAPS capsule [id number] No Take 1 capsule (50,000 Units total) by mouth every 7 (seven) days. Sunnie Nielsen, DO Taking Active Self            Med Note Aldean Ast Apr 19, 2022  2:47 AM) sat            Home Care and Equipment/Supplies: Were Home Health Services Ordered?: NA Any new equipment or medical supplies ordered?: NA  Functional Questionnaire: Do you need assistance with bathing/showering or dressing?: No Do you need assistance with meal preparation?: No Do you need assistance with eating?: No Do you have difficulty maintaining continence: No Do you need assistance with getting out of bed/getting out of a chair/moving?: No Do you have difficulty managing or taking your medications?: No  Follow up appointments reviewed: PCP Follow-up appointment confirmed?: Yes Date of PCP follow-up appointment?: 09/02/22 Follow-up Provider: [name] Specialist [facility] Follow-up appointment confirmed?: NA Do you need transportation to your follow-up appointment?: No Do you understand care options if your condition(s) worsen?: Yes-patient verbalized understanding    SIGNATURE Karena Addison, LPN [facility] Nurse Health Advisor Direct Dial [phone]

## 2022-08-23 NOTE — Patient Instructions (Signed)
[name] , Thank you for taking time to come for your Medicare Wellness Visit. I appreciate your ongoing commitment to your health goals. Please review the following plan we discussed and let me know if I can assist you in the future.   These are the goals we discussed:  Goals      DIET - EAT MORE FRUITS AND VEGETABLES     Quit Smoking        This is a list of the screening recommended for you and due dates:  Health Maintenance  Topic Date Due   DTaP/Tdap/Td vaccine (1 - Tdap) Never done   Zoster (Shingles) Vaccine (1 of 2) Never done   Colon Cancer Screening  Never done   COVID-19 Vaccine (5 - 2023-24 season) 10/23/2021   Flu Shot  09/23/2022   Medicare Annual Wellness Visit  08/23/2023   Hepatitis C Screening  Completed   HIV Screening  Completed   HPV Vaccine  Aged Out    Advanced directives: no  Conditions/risks identified: low falls risk  Next appointment: Follow up in one year for your annual wellness visit 08/24/2023 @ 9:15am telephone  Preventive Care 40-64 Years, Male Preventive care refers to lifestyle choices and visits with your health care provider that can promote health and wellness. What does preventive care include? A yearly physical exam. This is also called an annual well check. Dental exams once or twice a year. Routine eye exams. Ask your health care provider how often you should have your eyes checked. Personal lifestyle choices, including: Daily care of your teeth and gums. Regular physical activity. Eating a healthy diet. Avoiding tobacco and drug use. Limiting alcohol use. Practicing safe sex. Taking low-dose aspirin every day starting at age 16. What happens during an annual well check? The services and screenings done by your health care provider during your annual well check will depend on your age, overall health, lifestyle risk factors, and family history of disease. Counseling  Your health care provider may ask you questions about  your: Alcohol use. Tobacco use. Drug use. Emotional well-being. Home and relationship well-being. Sexual activity. Eating habits. Work and work Astronomer. Screening  You may have the following tests or measurements: Height, weight, and BMI. Blood pressure. Lipid and cholesterol levels. These may be checked every 5 years, or more frequently if you are over 43 years old. Skin check. Lung cancer screening. You may have this screening every year starting at age 49 if you have a 30-pack-year history of smoking and currently smoke or have quit within the past 15 years. Fecal occult blood test (FOBT) of the stool. You may have this test every year starting at age 19. Flexible sigmoidoscopy or colonoscopy. You may have a sigmoidoscopy every 5 years or a colonoscopy every 10 years starting at age 75. Prostate cancer screening. Recommendations will vary depending on your family history and other risks. Hepatitis C blood test. Hepatitis B blood test. Sexually transmitted disease (STD) testing. Diabetes screening. This is done by checking your blood sugar (glucose) after you have not eaten for a while (fasting). You may have this done every 1-3 years. Discuss your test results, treatment options, and if necessary, the need for more tests with your health care provider. Vaccines  Your health care provider may recommend certain vaccines, such as: Influenza vaccine. This is recommended every year. Tetanus, diphtheria, and acellular pertussis (Tdap, Td) vaccine. You may need a Td booster every 10 years. Zoster vaccine. You may need this after  age 34. Pneumococcal 13-valent conjugate (PCV13) vaccine. You may need this if you have certain conditions and have not been vaccinated. Pneumococcal polysaccharide (PPSV23) vaccine. You may need one or two doses if you smoke cigarettes or if you have certain conditions. Talk to your health care provider about which screenings and vaccines you need and how  often you need them. This information is not intended to replace advice given to you by your health care provider. Make sure you discuss any questions you have with your health care provider. Document Released: 03/07/2015 Document Revised: 10/29/2015 Document Reviewed: 12/10/2014 Elsevier Interactive Patient Education  2017 [employer].  Fall Prevention in the Home Falls can cause injuries. They can happen to people of all ages. There are many things you can do to make your home safe and to help prevent falls. What can I do on the outside of my home? Regularly fix the edges of walkways and driveways and fix any cracks. Remove anything that might make you trip as you walk through a door, such as a raised step or threshold. Trim any bushes or trees on the path to your home. Use bright outdoor lighting. Clear any walking paths of anything that might make someone trip, such as rocks or tools. Regularly check to see if handrails are loose or broken. Make sure that both sides of any steps have handrails. Any raised decks and porches should have guardrails on the edges. Have any leaves, snow, or ice cleared regularly. Use sand or salt on walking paths during winter. Clean up any spills in your garage right away. This includes oil or grease spills. What can I do in the bathroom? Use night lights. Install grab bars by the toilet and in the tub and shower. Do not use towel bars as grab bars. Use non-skid mats or decals in the tub or shower. If you need to sit down in the shower, use a plastic, non-slip stool. Keep the floor dry. Clean up any water that spills on the floor as soon as it happens. Remove soap buildup in the tub or shower regularly. Attach bath mats securely with double-sided non-slip rug tape. Do not have throw rugs and other things on the floor that can make you trip. What can I do in the bedroom? Use night lights. Make sure that you have a light by your bed that is easy to  reach. Do not use any sheets or blankets that are too big for your bed. They should not hang down onto the floor. Have a firm chair that has side arms. You can use this for support while you get dressed. Do not have throw rugs and other things on the floor that can make you trip. What can I do in the kitchen? Clean up any spills right away. Avoid walking on wet floors. Keep items that you use a lot in easy-to-reach places. If you need to reach something above you, use a strong step stool that has a grab bar. Keep electrical cords out of the way. Do not use floor polish or wax that makes floors slippery. If you must use wax, use non-skid floor wax. Do not have throw rugs and other things on the floor that can make you trip. What can I do with my stairs? Do not leave any items on the stairs. Make sure that there are handrails on both sides of the stairs and use them. Fix handrails that are broken or loose. Make sure that handrails are as long  as the stairways. Check any carpeting to make sure that it is firmly attached to the stairs. Fix any carpet that is loose or worn. Avoid having throw rugs at the top or bottom of the stairs. If you do have throw rugs, attach them to the floor with carpet tape. Make sure that you have a light switch at the top of the stairs and the bottom of the stairs. If you do not have them, ask someone to add them for you. What else can I do to help prevent falls? Wear shoes that: Do not have high heels. Have rubber bottoms. Are comfortable and fit you well. Are closed at the toe. Do not wear sandals. If you use a stepladder: Make sure that it is fully opened. Do not climb a closed stepladder. Make sure that both sides of the stepladder are locked into place. Ask someone to hold it for you, if possible. Clearly mark and make sure that you can see: Any grab bars or handrails. First and last steps. Where the edge of each step is. Use tools that help you move  around (mobility aids) if they are needed. These include: Canes. Walkers. Scooters. Crutches. Turn on the lights when you go into a dark area. Replace any light bulbs as soon as they burn out. Set up your furniture so you have a clear path. Avoid moving your furniture around. If any of your floors are uneven, fix them. If there are any pets around you, be aware of where they are. Review your medicines with your doctor. Some medicines can make you feel dizzy. This can increase your chance of falling. Ask your doctor what other things that you can do to help prevent falls. This information is not intended to replace advice given to you by your health care provider. Make sure you discuss any questions you have with your health care provider. Document Released: 12/05/2008 Document Revised: 07/17/2015 Document Reviewed: 03/15/2014 Elsevier Interactive Patient Education  2017 [employer].

## 2022-08-24 NOTE — Progress Notes (Deleted)
Established patient visit   Patient: Anthony Hess   DOB: 04-28-1955   67 y.o. Male  MRN: [id number] Visit Date: 09/02/2022  Today's healthcare provider: Ronnald Ramp, MD   No chief complaint on file.  Subjective      Hospital Follow up  Anthony Hess is a 66 y.o. male with hx notable for *** presenting for hospital follow up for ***.    Hx obtained partially from patient and partially from chart review:   Patient was evaluated in the ED  on *** and discharged on *** after diagnosis and treatment for ***  Disposition was to ***   TOC call was completed on 08/23/22  Notable events & treatment during hospitalization include:  -***  Medication Changes at discharge:  -***  Other Follow Up recommended after hospital D/C:  ***   Medications: Outpatient Medications Prior to Visit  Medication Sig   acetaminophen (TYLENOL) 500 MG tablet Take 2 tablets (1,000 mg total) by mouth every 6 (six) hours as needed for mild pain, fever or headache (or Fever >/= 101).   amLODipine (NORVASC) 2.5 MG tablet Take 1 tablet (2.5 mg total) by mouth daily.   apixaban (ELIQUIS) 5 MG TABS tablet Take 1 tablet (5 mg total) by mouth 2 (two) times daily.   Aspirin 81 MG CAPS Take by mouth.   atorvastatin (LIPITOR) 80 MG tablet Take 1 tablet (80 mg total) by mouth daily. (Patient not taking: Reported on 08/17/2022)   calcitRIOL (ROCALTROL) 0.5 MCG capsule Take 1 capsule (0.5 mcg total) by mouth daily.   calcium acetate (PHOSLO) 667 MG capsule Take 1 capsule (667 mg total) by mouth 3 (three) times daily with meals.   hydrALAZINE (APRESOLINE) 25 MG tablet Take 1 tablet (25 mg total) by mouth 3 (three) times daily. (Patient not taking: Reported on 08/17/2022)   isosorbide dinitrate (ISORDIL) 10 MG tablet Take 1 tablet (10 mg total) by mouth 3 (three) times daily.   metoprolol succinate (TOPROL-XL) 25 MG 24 hr tablet Take 25 mg by mouth daily.   multivitamin (RENA-VIT) TABS tablet Take 1  tablet by mouth at bedtime.   ondansetron (ZOFRAN-ODT) 4 MG disintegrating tablet Take 1 tablet (4 mg total) by mouth every 8 (eight) hours as needed for nausea or vomiting.   pantoprazole (PROTONIX) 40 MG tablet Take 1 tablet (40 mg total) by mouth daily.   RENVELA 800 MG tablet Take 800 mg by mouth 3 (three) times daily with meals. (Patient not taking: Reported on 08/17/2022)   sucroferric oxyhydroxide (VELPHORO) 500 MG chewable tablet Chew 1 tablet (500 mg total) by mouth 3 (three) times daily with meals. (Patient not taking: Reported on 08/23/2022)   Vitamin D, Ergocalciferol, (DRISDOL) 1.25 MG (50000 UNIT) CAPS capsule Take 1 capsule (50,000 Units total) by mouth every 7 (seven) days.   No facility-administered medications prior to visit.    Review of Systems  {Labs  Heme  Chem  Endocrine  Serology  Results Review (optional):23779}   Objective    There were no vitals taken for this visit. {Show previous vital signs (optional):23777}  Physical Exam  ***  No results found for any visits on 09/02/22.  Assessment & Plan     Problem List Items Addressed This Visit   None    No follow-ups on file.         The entirety of the information documented in the History of Present Illness, Review of Systems and Physical Exam were personally obtained by  me. Portions of this information were initially documented by *** . I, Ronnald Ramp, MD have reviewed the documentation above for thoroughness and accuracy.     Ronnald Ramp, MD  [facility] [phone] (phone) [phone] (fax)  [facility]

## 2022-09-02 ENCOUNTER — Ambulatory Visit: Payer: Medicare Other | Admitting: Family Medicine

## 2022-09-03 ENCOUNTER — Emergency Department: Payer: Medicare Other

## 2022-09-03 ENCOUNTER — Emergency Department
Admission: EM | Admit: 2022-09-03 | Discharge: 2022-09-03 | Disposition: A | Discharge status: Home / Self Care | Payer: Medicare Other | Source: Home / Self Care | Attending: Emergency Medicine | Admitting: Emergency Medicine

## 2022-09-03 ENCOUNTER — Other Ambulatory Visit: Payer: Self-pay

## 2022-09-03 DIAGNOSIS — M79604 Pain in right leg: Secondary | ICD-10-CM | POA: Insufficient documentation

## 2022-09-03 DIAGNOSIS — R1032 Left lower quadrant pain: Secondary | ICD-10-CM | POA: Insufficient documentation

## 2022-09-03 DIAGNOSIS — R0602 Shortness of breath: Secondary | ICD-10-CM | POA: Diagnosis not present

## 2022-09-03 DIAGNOSIS — R109 Unspecified abdominal pain: Secondary | ICD-10-CM

## 2022-09-03 DIAGNOSIS — J9601 Acute respiratory failure with hypoxia: Secondary | ICD-10-CM | POA: Diagnosis not present

## 2022-09-03 LAB — COMPREHENSIVE METABOLIC PANEL
ALT: 17 U/L (ref 0–44)
AST: 19 U/L (ref 15–41)
Albumin: 3.2 g/dL — ABNORMAL LOW (ref 3.5–5.0)
Alkaline Phosphatase: 57 U/L (ref 38–126)
Anion gap: 15 (ref 5–15)
BUN: 33 mg/dL — ABNORMAL HIGH (ref 8–23)
CO2: 25 mmol/L (ref 22–32)
Calcium: 7.6 mg/dL — ABNORMAL LOW (ref 8.9–10.3)
Chloride: 100 mmol/L (ref 98–111)
Creatinine, Ser: 6.54 mg/dL — ABNORMAL HIGH (ref 0.61–1.24)
GFR, Estimated: 9 mL/min — ABNORMAL LOW (ref >60)
Glucose, Bld: 131 mg/dL — ABNORMAL HIGH (ref 70–99)
Potassium: 4.8 mmol/L (ref 3.5–5.1)
Sodium: 140 mmol/L (ref 135–145)
Total Bilirubin: 0.3 mg/dL (ref 0.3–1.2)
Total Protein: 6.5 g/dL (ref 6.5–8.1)

## 2022-09-03 LAB — CBC
HCT: 35.6 % — ABNORMAL LOW (ref 39.0–52.0)
Hemoglobin: 10.7 g/dL — ABNORMAL LOW (ref 13.0–17.0)
MCH: 28.4 pg (ref 26.0–34.0)
MCHC: 30.1 g/dL (ref 30.0–36.0)
MCV: 94.4 fL (ref 80.0–100.0)
Platelets: 247 10*3/uL (ref 150–400)
RBC: 3.77 MIL/uL — ABNORMAL LOW (ref 4.22–5.81)
RDW: 15.7 % — ABNORMAL HIGH (ref 11.5–15.5)
WBC: 8.8 10*3/uL (ref 4.0–10.5)
nRBC: 0 % (ref 0.0–0.2)

## 2022-09-03 LAB — LIPASE, BLOOD: Lipase: 46 U/L (ref 11–51)

## 2022-09-03 MED ORDER — IOHEXOL 300 MG/ML  SOLN
100.0000 mL | Freq: Once | INTRAMUSCULAR | Status: AC | PRN
  Administered 2022-09-03: 100 mL via INTRAVENOUS

## 2022-09-03 NOTE — Discharge Instructions (Signed)
Please seek medical attention for any high fevers, chest pain, shortness of breath, change in behavior, persistent vomiting, bloody stool or any other new or concerning symptoms.  

## 2022-09-03 NOTE — ED Triage Notes (Signed)
Pt to ED via [facility] c/o lower abd pain that has been going on for 1 week and right leg pain that has been there for a few weeks. Pt endorses some nausea. Was at [facility] and they told him he had a clot in right leg. Pt is prescribed eliquis but reportedly does not take it regularly. Denies CP, SOB, fevers, dizziness. Last bm 2 days ago

## 2022-09-03 NOTE — ED Provider Notes (Signed)
[facility] Provider Note    Event Date/Time   First MD Initiated Contact with Patient 09/03/22 0159     (approximate)   History   Abdominal Pain   HPI  Anthony Hess is a 67 y.o. male   who presents to the emergency department today because of concerns for abdominal pain and right lower leg pain.  Symptoms have been present for 1 week.  He says the abdominal pain is located in the lower central abdomen.  It is sharp.  Has had some associated nausea.  Denies any diarrhea or change in bowel movement.  Denies anything that makes the pain better or worse.  In addition he has had right calf pain.  Went to urgent care today where they were worried he has a blood clot.  He says that he recently started Eliquis although was diagnosed with a blood clot a couple months ago. No fevers.    Physical Exam   Triage Vital Signs: ED Triage Vitals  Encounter Vitals Group     BP 09/03/22 0050 101/65     Systolic BP Percentile --      Diastolic BP Percentile --      Pulse Rate 09/03/22 0050 91     Resp 09/03/22 0050 18     Temp 09/03/22 0050 98.6 F (37 C)     Temp Source 09/03/22 0050 Oral     SpO2 09/03/22 0050 95 %     Weight 09/03/22 0051 134 lb (60.8 kg)     Height 09/03/22 0051 5\' 2"  (1.575 m)     Head Circumference --      Peak Flow --      Pain Score 09/03/22 0050 8     Pain Loc --      Pain Education --      Exclude from Growth Chart --     Most recent vital signs: Vitals:   09/03/22 0050  BP: 101/65  Pulse: 91  Resp: 18  Temp: 98.6 F (37 C)  SpO2: 95%   General: Awake, alert, oriented. CV:  Good peripheral perfusion. Regular rate and rhythm. Resp:  Normal effort. Lungs clear. Abd:  No distention.  Other:  No lower extremity swelling. No right calf tenderness.   ED Results / Procedures / Treatments   Labs (all labs ordered are listed, but only abnormal results are displayed) Labs Reviewed  COMPREHENSIVE METABOLIC PANEL - Abnormal;  Notable for the following components:      Result Value   Glucose, Bld 131 (*)    BUN 33 (*)    Creatinine, Ser 6.54 (*)    Calcium 7.6 (*)    Albumin 3.2 (*)    GFR, Estimated 9 (*)    All other components within normal limits  CBC - Abnormal; Notable for the following components:   RBC 3.77 (*)    Hemoglobin 10.7 (*)    HCT 35.6 (*)    RDW 15.7 (*)    All other components within normal limits  LIPASE, BLOOD  URINALYSIS, ROUTINE W REFLEX MICROSCOPIC     EKG  None   RADIOLOGY I independently interpreted and visualized the CT abd/pel. My interpretation: No free air Radiology interpretation:  IMPRESSION:  1. No acute or inflammatory process identified in the abdomen or  pelvis.  2. Normal appendix. Large bowel retained stool. Chronic renal  atrophy. Advanced Aortic Atherosclerosis (ICD10-I70.0).    I independently interpreted and visualized the [place] right lower extremity. My interpretation:  no clot Radiology interpretation:  IMPRESSION:  No evidence of right lower extremity deep venous thrombosis.      PROCEDURES:  Critical Care performed: No   MEDICATIONS ORDERED IN ED: Medications - No data to display   IMPRESSION / MDM / ASSESSMENT AND PLAN / ED COURSE  I reviewed the triage vital signs and the nursing notes.                              Differential diagnosis includes, but is not limited to, intraabdominal infection, blood clot  Patient's presentation is most consistent with acute presentation with potential threat to life or bodily function.   Patient presented to the emergency department today because of concerns for lower abdominal pain as well as possible blood clot in his right leg.  Fortunately imaging of both the right leg and CT abdomen pelvis did not show any concerning abnormalities.  Patient did not think he be able to produce urine for [place] at this time given very minimal production with dialysis and he did not want to wait.  At this time I  think that is reasonable.  No concerning leukocytosis or fever.  Will plan on discharge and follow-up with primary care.     FINAL CLINICAL IMPRESSION(S) / ED DIAGNOSES   Final diagnoses:  Right leg pain  Abdominal pain, unspecified abdominal location      Note:  This document was prepared using Dragon voice recognition software and may include unintentional dictation errors.    Phineas Semen, MD 09/03/22 [phone]

## 2022-09-03 NOTE — ED Triage Notes (Signed)
First Nurse Note:  BIB [facility] from home. C/o LLQ abd pain and R leg pain. Seen at [facility] yesterday and was dx with DVT in R leg. Pt prescribed blood thinners but states that he does not take it typically. Pt did report to EMS that he took his Eliquis just prior to their arrival. Pt also reported to EMS that he doesn't take any of his prescribed medications.   Nausea reported with last bm 2 days ago.   Pt alert and oriented with EMS  EMS VS :  116/80 95% RA HR 88 CBG 129

## 2022-09-06 ENCOUNTER — Emergency Department: Payer: Medicare Other

## 2022-09-06 ENCOUNTER — Other Ambulatory Visit: Payer: Self-pay

## 2022-09-06 ENCOUNTER — Inpatient Hospital Stay
Admission: EM | Admit: 2022-09-06 | Discharge: 2022-09-08 | DRG: 189 | Disposition: A | Discharge status: Home / Self Care | Payer: Medicare Other | Attending: Hospitalist | Admitting: Hospitalist

## 2022-09-06 DIAGNOSIS — E785 Hyperlipidemia, unspecified: Secondary | ICD-10-CM | POA: Diagnosis present

## 2022-09-06 DIAGNOSIS — I482 Chronic atrial fibrillation, unspecified: Secondary | ICD-10-CM | POA: Diagnosis present

## 2022-09-06 DIAGNOSIS — I2510 Atherosclerotic heart disease of native coronary artery without angina pectoris: Secondary | ICD-10-CM | POA: Diagnosis present

## 2022-09-06 DIAGNOSIS — I2699 Other pulmonary embolism without acute cor pulmonale: Secondary | ICD-10-CM | POA: Diagnosis present

## 2022-09-06 DIAGNOSIS — Z1152 Encounter for screening for COVID-19: Secondary | ICD-10-CM | POA: Diagnosis not present

## 2022-09-06 DIAGNOSIS — E8770 Fluid overload, unspecified: Secondary | ICD-10-CM | POA: Diagnosis present

## 2022-09-06 DIAGNOSIS — I739 Peripheral vascular disease, unspecified: Secondary | ICD-10-CM | POA: Diagnosis present

## 2022-09-06 DIAGNOSIS — Z8249 Family history of ischemic heart disease and other diseases of the circulatory system: Secondary | ICD-10-CM

## 2022-09-06 DIAGNOSIS — N186 End stage renal disease: Secondary | ICD-10-CM | POA: Diagnosis present

## 2022-09-06 DIAGNOSIS — Z8551 Personal history of malignant neoplasm of bladder: Secondary | ICD-10-CM

## 2022-09-06 DIAGNOSIS — I5042 Chronic combined systolic (congestive) and diastolic (congestive) heart failure: Secondary | ICD-10-CM | POA: Diagnosis present

## 2022-09-06 DIAGNOSIS — I5030 Unspecified diastolic (congestive) heart failure: Secondary | ICD-10-CM | POA: Diagnosis present

## 2022-09-06 DIAGNOSIS — I252 Old myocardial infarction: Secondary | ICD-10-CM

## 2022-09-06 DIAGNOSIS — Z8711 Personal history of peptic ulcer disease: Secondary | ICD-10-CM

## 2022-09-06 DIAGNOSIS — I132 Hypertensive heart and chronic kidney disease with heart failure and with stage 5 chronic kidney disease, or end stage renal disease: Secondary | ICD-10-CM | POA: Diagnosis present

## 2022-09-06 DIAGNOSIS — Z7982 Long term (current) use of aspirin: Secondary | ICD-10-CM

## 2022-09-06 DIAGNOSIS — I700 Atherosclerosis of aorta: Secondary | ICD-10-CM | POA: Diagnosis present

## 2022-09-06 DIAGNOSIS — J9601 Acute respiratory failure with hypoxia: Principal | ICD-10-CM | POA: Diagnosis present

## 2022-09-06 DIAGNOSIS — I420 Dilated cardiomyopathy: Secondary | ICD-10-CM | POA: Diagnosis present

## 2022-09-06 DIAGNOSIS — Z951 Presence of aortocoronary bypass graft: Secondary | ICD-10-CM | POA: Diagnosis not present

## 2022-09-06 DIAGNOSIS — F17210 Nicotine dependence, cigarettes, uncomplicated: Secondary | ICD-10-CM | POA: Diagnosis present

## 2022-09-06 DIAGNOSIS — Z79899 Other long term (current) drug therapy: Secondary | ICD-10-CM

## 2022-09-06 DIAGNOSIS — N2581 Secondary hyperparathyroidism of renal origin: Secondary | ICD-10-CM | POA: Diagnosis present

## 2022-09-06 DIAGNOSIS — R0602 Shortness of breath: Secondary | ICD-10-CM | POA: Diagnosis present

## 2022-09-06 DIAGNOSIS — Z87442 Personal history of urinary calculi: Secondary | ICD-10-CM

## 2022-09-06 DIAGNOSIS — Z992 Dependence on renal dialysis: Secondary | ICD-10-CM | POA: Diagnosis not present

## 2022-09-06 DIAGNOSIS — Z7901 Long term (current) use of anticoagulants: Secondary | ICD-10-CM

## 2022-09-06 DIAGNOSIS — D631 Anemia in chronic kidney disease: Secondary | ICD-10-CM | POA: Diagnosis present

## 2022-09-06 DIAGNOSIS — D72829 Elevated white blood cell count, unspecified: Secondary | ICD-10-CM | POA: Diagnosis present

## 2022-09-06 DIAGNOSIS — E875 Hyperkalemia: Secondary | ICD-10-CM | POA: Diagnosis present

## 2022-09-06 DIAGNOSIS — I161 Hypertensive emergency: Secondary | ICD-10-CM | POA: Diagnosis present

## 2022-09-06 DIAGNOSIS — Z8616 Personal history of COVID-19: Secondary | ICD-10-CM

## 2022-09-06 DIAGNOSIS — J810 Acute pulmonary edema: Secondary | ICD-10-CM

## 2022-09-06 DIAGNOSIS — Z86711 Personal history of pulmonary embolism: Secondary | ICD-10-CM

## 2022-09-06 DIAGNOSIS — Z91158 Patient's noncompliance with renal dialysis for other reason: Secondary | ICD-10-CM

## 2022-09-06 LAB — CBC
HCT: 34.8 % — ABNORMAL LOW (ref 39.0–52.0)
Hemoglobin: 10.9 g/dL — ABNORMAL LOW (ref 13.0–17.0)
MCH: 28 pg (ref 26.0–34.0)
MCHC: 31.3 g/dL (ref 30.0–36.0)
MCV: 89.5 fL (ref 80.0–100.0)
Platelets: 337 10*3/uL (ref 150–400)
RBC: 3.89 MIL/uL — ABNORMAL LOW (ref 4.22–5.81)
RDW: 15.8 % — ABNORMAL HIGH (ref 11.5–15.5)
WBC: 14.5 10*3/uL — ABNORMAL HIGH (ref 4.0–10.5)
nRBC: 0 % (ref 0.0–0.2)

## 2022-09-06 LAB — RENAL FUNCTION PANEL
Albumin: 3.2 g/dL — ABNORMAL LOW (ref 3.5–5.0)
Anion gap: 16 — ABNORMAL HIGH (ref 5–15)
BUN: 43 mg/dL — ABNORMAL HIGH (ref 8–23)
CO2: 24 mmol/L (ref 22–32)
Calcium: 7.2 mg/dL — ABNORMAL LOW (ref 8.9–10.3)
Chloride: 96 mmol/L — ABNORMAL LOW (ref 98–111)
Creatinine, Ser: 6.28 mg/dL — ABNORMAL HIGH (ref 0.61–1.24)
GFR, Estimated: 9 mL/min — ABNORMAL LOW (ref >60)
Glucose, Bld: 130 mg/dL — ABNORMAL HIGH (ref 70–99)
Phosphorus: 5.4 mg/dL — ABNORMAL HIGH (ref 2.5–4.6)
Potassium: 4.6 mmol/L (ref 3.5–5.1)
Sodium: 136 mmol/L (ref 135–145)

## 2022-09-06 LAB — CBC WITH DIFFERENTIAL/PLATELET
Abs Immature Granulocytes: 0.07 10*3/uL (ref 0.00–0.07)
Basophils Absolute: 0.1 10*3/uL (ref 0.0–0.1)
Basophils Relative: 0 %
Eosinophils Absolute: 0.5 10*3/uL (ref 0.0–0.5)
Eosinophils Relative: 4 %
HCT: 33.9 % — ABNORMAL LOW (ref 39.0–52.0)
Hemoglobin: 10.4 g/dL — ABNORMAL LOW (ref 13.0–17.0)
Immature Granulocytes: 1 %
Lymphocytes Relative: 7 %
Lymphs Abs: 0.9 10*3/uL (ref 0.7–4.0)
MCH: 28.3 pg (ref 26.0–34.0)
MCHC: 30.7 g/dL (ref 30.0–36.0)
MCV: 92.4 fL (ref 80.0–100.0)
Monocytes Absolute: 1.3 10*3/uL — ABNORMAL HIGH (ref 0.1–1.0)
Monocytes Relative: 9 %
Neutro Abs: 11.1 10*3/uL — ABNORMAL HIGH (ref 1.7–7.7)
Neutrophils Relative %: 79 %
Platelets: 346 10*3/uL (ref 150–400)
RBC: 3.67 MIL/uL — ABNORMAL LOW (ref 4.22–5.81)
RDW: 15.9 % — ABNORMAL HIGH (ref 11.5–15.5)
WBC: 14 10*3/uL — ABNORMAL HIGH (ref 4.0–10.5)
nRBC: 0.1 % (ref 0.0–0.2)

## 2022-09-06 LAB — COMPREHENSIVE METABOLIC PANEL
ALT: 21 U/L (ref 0–44)
AST: 21 U/L (ref 15–41)
Albumin: 3.1 g/dL — ABNORMAL LOW (ref 3.5–5.0)
Alkaline Phosphatase: 52 U/L (ref 38–126)
Anion gap: 17 — ABNORMAL HIGH (ref 5–15)
BUN: 85 mg/dL — ABNORMAL HIGH (ref 8–23)
CO2: 15 mmol/L — ABNORMAL LOW (ref 22–32)
Calcium: 6.1 mg/dL — CL (ref 8.9–10.3)
Chloride: 107 mmol/L (ref 98–111)
Creatinine, Ser: 10.82 mg/dL — ABNORMAL HIGH (ref 0.61–1.24)
GFR, Estimated: 5 mL/min — ABNORMAL LOW (ref >60)
Glucose, Bld: 113 mg/dL — ABNORMAL HIGH (ref 70–99)
Potassium: 5.9 mmol/L — ABNORMAL HIGH (ref 3.5–5.1)
Sodium: 139 mmol/L (ref 135–145)
Total Bilirubin: 0.4 mg/dL (ref 0.3–1.2)
Total Protein: 6.4 g/dL — ABNORMAL LOW (ref 6.5–8.1)

## 2022-09-06 LAB — BRAIN NATRIURETIC PEPTIDE: B Natriuretic Peptide: >4500 pg/mL — ABNORMAL HIGH (ref 0.0–100.0)

## 2022-09-06 LAB — PROTIME-INR
INR: 1.1 (ref 0.8–1.2)
Prothrombin Time: 14.5 seconds (ref 11.4–15.2)

## 2022-09-06 LAB — SARS CORONAVIRUS 2 BY RT PCR: SARS Coronavirus 2 by RT PCR: NEGATIVE

## 2022-09-06 LAB — HEPATITIS B SURFACE ANTIGEN: Hepatitis B Surface Ag: NONREACTIVE

## 2022-09-06 LAB — PROCALCITONIN: Procalcitonin: 0.56 ng/mL

## 2022-09-06 MED ORDER — HYDRALAZINE HCL 25 MG PO TABS
25.0000 mg | ORAL_TABLET | Freq: Three times a day (TID) | ORAL | Status: DC
  Administered 2022-09-06 – 2022-09-08 (×4): 25 mg via ORAL
  Filled 2022-09-06 (×5): qty 1

## 2022-09-06 MED ORDER — AMLODIPINE BESYLATE 5 MG PO TABS
2.5000 mg | ORAL_TABLET | Freq: Every day | ORAL | Status: DC
  Administered 2022-09-07 – 2022-09-08 (×2): 2.5 mg via ORAL
  Filled 2022-09-06 (×2): qty 1

## 2022-09-06 MED ORDER — CALCIUM GLUCONATE-NACL 1-0.675 GM/50ML-% IV SOLN
1.0000 g | Freq: Once | INTRAVENOUS | Status: AC
  Administered 2022-09-06: 1000 mg via INTRAVENOUS
  Filled 2022-09-06: qty 50

## 2022-09-06 MED ORDER — PANTOPRAZOLE SODIUM 40 MG PO TBEC
40.0000 mg | DELAYED_RELEASE_TABLET | Freq: Every day | ORAL | Status: DC
  Administered 2022-09-07 – 2022-09-08 (×2): 40 mg via ORAL
  Filled 2022-09-06 (×2): qty 1

## 2022-09-06 MED ORDER — HEPARIN SODIUM (PORCINE) 1000 UNIT/ML DIALYSIS
1000.0000 [IU] | INTRAMUSCULAR | Status: DC | PRN
  Administered 2022-09-06 (×3): 1000 [IU]

## 2022-09-06 MED ORDER — CHLORHEXIDINE GLUCONATE CLOTH 2 % EX PADS
6.0000 | MEDICATED_PAD | Freq: Every day | CUTANEOUS | Status: DC
  Administered 2022-09-07 – 2022-09-08 (×2): 6 via TOPICAL
  Filled 2022-09-06: qty 6

## 2022-09-06 MED ORDER — ISOSORBIDE DINITRATE 10 MG PO TABS: 10.0000 mg | ORAL_TABLET | Freq: Three times a day (TID) | ORAL | Status: DC

## 2022-09-06 MED ORDER — HYDRALAZINE HCL 20 MG/ML IJ SOLN
10.0000 mg | INTRAMUSCULAR | Status: DC | PRN
  Administered 2022-09-06 – 2022-09-08 (×2): 10 mg via INTRAVENOUS
  Filled 2022-09-06 (×4): qty 1

## 2022-09-06 MED ORDER — CALCITRIOL 0.25 MCG PO CAPS
0.5000 ug | ORAL_CAPSULE | Freq: Every day | ORAL | Status: DC
  Administered 2022-09-07 – 2022-09-08 (×2): 0.5 ug via ORAL
  Filled 2022-09-06 (×3): qty 2

## 2022-09-06 MED ORDER — ONDANSETRON HCL 4 MG/2ML IJ SOLN
4.0000 mg | Freq: Three times a day (TID) | INTRAMUSCULAR | Status: DC | PRN
  Administered 2022-09-06: 4 mg via INTRAVENOUS
  Filled 2022-09-06: qty 2

## 2022-09-06 MED ORDER — ALBUTEROL SULFATE (2.5 MG/3ML) 0.083% IN NEBU: 2.5000 mg | INHALATION_SOLUTION | RESPIRATORY_TRACT | Status: DC | PRN

## 2022-09-06 MED ORDER — HEPARIN SODIUM (PORCINE) 1000 UNIT/ML IJ SOLN
INTRAMUSCULAR | Status: AC
  Filled 2022-09-06: qty 10

## 2022-09-06 MED ORDER — FUROSEMIDE 10 MG/ML IJ SOLN
100.0000 mg | Freq: Once | INTRAVENOUS | Status: AC
  Administered 2022-09-06: 100 mg via INTRAVENOUS
  Filled 2022-09-06: qty 10

## 2022-09-06 MED ORDER — NICOTINE 21 MG/24HR TD PT24
21.0000 mg | MEDICATED_PATCH | Freq: Every day | TRANSDERMAL | Status: DC
  Administered 2022-09-06 – 2022-09-08 (×3): 21 mg via TRANSDERMAL
  Filled 2022-09-06 (×3): qty 1

## 2022-09-06 MED ORDER — NITROGLYCERIN IN D5W 200-5 MCG/ML-% IV SOLN
0.0000 ug/min | INTRAVENOUS | Status: DC
  Administered 2022-09-06: 200 ug/min via INTRAVENOUS
  Administered 2022-09-06: 125 ug/min via INTRAVENOUS
  Administered 2022-09-06: 120 ug/min via INTRAVENOUS
  Administered 2022-09-06 (×3): 200 ug/min via INTRAVENOUS
  Administered 2022-09-06: 100 ug/min via INTRAVENOUS
  Administered 2022-09-07: 165 ug/min via INTRAVENOUS
  Filled 2022-09-06 (×7): qty 250

## 2022-09-06 MED ORDER — ATORVASTATIN CALCIUM 80 MG PO TABS
80.0000 mg | ORAL_TABLET | Freq: Every day | ORAL | Status: DC
  Administered 2022-09-07 – 2022-09-08 (×2): 80 mg via ORAL
  Filled 2022-09-06 (×2): qty 1

## 2022-09-06 MED ORDER — CALCIUM ACETATE (PHOS BINDER) 667 MG PO CAPS
667.0000 mg | ORAL_CAPSULE | Freq: Three times a day (TID) | ORAL | Status: DC
  Administered 2022-09-07 – 2022-09-08 (×4): 667 mg via ORAL
  Filled 2022-09-06 (×6): qty 1

## 2022-09-06 MED ORDER — METOPROLOL SUCCINATE ER 25 MG PO TB24
25.0000 mg | ORAL_TABLET | Freq: Every day | ORAL | Status: DC
  Administered 2022-09-07 – 2022-09-08 (×2): 25 mg via ORAL
  Filled 2022-09-06 (×2): qty 1

## 2022-09-06 MED ORDER — APIXABAN 2.5 MG PO TABS
2.5000 mg | ORAL_TABLET | Freq: Two times a day (BID) | ORAL | Status: DC
  Administered 2022-09-06 – 2022-09-08 (×4): 2.5 mg via ORAL
  Filled 2022-09-06 (×6): qty 1

## 2022-09-06 MED ORDER — ACETAMINOPHEN 325 MG PO TABS: 650.0000 mg | ORAL_TABLET | Freq: Four times a day (QID) | ORAL | Status: DC | PRN

## 2022-09-06 MED ORDER — DM-GUAIFENESIN ER 30-600 MG PO TB12: 1.0000 | ORAL_TABLET | Freq: Two times a day (BID) | ORAL | Status: DC | PRN

## 2022-09-06 MED ORDER — HEPARIN SODIUM (PORCINE) 1000 UNIT/ML DIALYSIS
25.0000 [IU]/kg | INTRAMUSCULAR | Status: DC | PRN
  Administered 2022-09-06: 1500 [IU] via INTRAVENOUS_CENTRAL

## 2022-09-06 NOTE — Procedures (Signed)
Dr. Wynelle Link verbally ordered to try to take bipap off mid HD tx to check if patient can tolerate not having it.

## 2022-09-06 NOTE — ED Provider Notes (Signed)
[facility] Provider Note    Event Date/Time   First MD Initiated Contact with Patient 09/06/22 [phone]     (approximate)   History   Shortness of Breath  Level 5 caveat:  history/ROS limited by acute/critical illness   HPI Anthony Hess is a 67 y.o. male with history of CHF and chronic renal failure on hemodialysis Tuesdays, Thursdays, and Saturdays.  He presents by EMS in severe respiratory distress.  He reports that he missed his last dialysis appointment because he missed his ride.  That was 2 days ago and he is not scheduled for another 24 hours.  He reports developing acute and severe shortness of breath overnight which prompted him to call EMS.  EMS reports that his oxygen saturation was in the low 70s when they arrived and he came to the ED on CPAP which made him feel better.  He was started immediately on BiPAP and he confirmed that that is helping as well.  He denies chest pain, fever, nausea, vomiting, and abdominal pain.  He reports still producing some urine.     Physical Exam   ED Triage Vitals  Encounter Vitals Group     BP 09/06/22 0437 (!) 224/151     Systolic BP Percentile --      Diastolic BP Percentile --      Pulse Rate 09/06/22 0437 96     Resp 09/06/22 0437 (!) 32     Temp 09/06/22 0437 97.7 F (36.5 C)     Temp Source 09/06/22 0437 Axillary     SpO2 09/06/22 0435 100 %     Weight 09/06/22 0438 60.3 kg (132 lb 15 oz)     Height 09/06/22 0438 1.575 m (5\' 2" )     Head Circumference --      Peak Flow --      Pain Score 09/06/22 0438 7     Pain Loc --      Pain Education --      Exclude from Growth Chart --      Most recent vital signs: Vitals:   09/06/22 0748 09/06/22 0818  BP:  (!) 164/124  Pulse: 86 83  Resp: 12 19  Temp:  97.6 F (36.4 C)  SpO2: 100% 98%    General: Awake, alert, but in severe distress. CV:  Good peripheral perfusion.  Borderline tachycardia.  Normal heart sounds.  Regular rhythm. Resp:  Severe  respiratory distress with accessory muscle usage and intercostal retractions.  Coarse breath sounds throughout. Abd:  No distention.  No tenderness to palpation.   ED Results / Procedures / Treatments   Labs (all labs ordered are listed, but only abnormal results are displayed) Labs Reviewed  COMPREHENSIVE METABOLIC PANEL - Abnormal; Notable for the following components:      Result Value   Potassium 5.9 (*)    CO2 15 (*)    Glucose, Bld 113 (*)    BUN 85 (*)    Creatinine, Ser 10.82 (*)    Calcium 6.1 (*)    Total Protein 6.4 (*)    Albumin 3.1 (*)    GFR, Estimated 5 (*)    Anion gap 17 (*)    All other components within normal limits  CBC WITH DIFFERENTIAL/PLATELET - Abnormal; Notable for the following components:   WBC 14.0 (*)    RBC 3.67 (*)    Hemoglobin 10.4 (*)    HCT 33.9 (*)    RDW 15.9 (*)  Neutro Abs 11.1 (*)    Monocytes Absolute 1.3 (*)    All other components within normal limits  BRAIN NATRIURETIC PEPTIDE - Abnormal; Notable for the following components:   B Natriuretic Peptide >4,500.0 (*)    All other components within normal limits  SARS CORONAVIRUS 2 BY RT PCR  PROTIME-INR  PROCALCITONIN  HEPATITIS B SURFACE ANTIGEN  HEPATITIS B SURFACE ANTIBODY, QUANTITATIVE     EKG  ED ECG REPORT I, Loleta Rose, the attending physician, personally viewed and interpreted this ECG.  Date: 09/06/2022 EKG Time: 4:31 AM Rate: 104 Rhythm: sinus tachycardia QRS Axis: normal Intervals: normal ST/T Wave abnormalities: Non-specific ST segment / T-wave changes, but no clear evidence of acute ischemia. Narrative Interpretation: no definitive evidence of acute ischemia; does not meet STEMI criteria.    RADIOLOGY I viewed and interpreted the patient's chest x-ray.  It appears consistent with pulmonary edema.  Radiology report concurs although they also feel they cannot exclude infectious process.   PROCEDURES:  Critical Care performed: Yes, see critical care  procedure note(s)  .1-3 Lead EKG Interpretation  Performed by: Loleta Rose, MD Authorized by: Loleta Rose, MD     Interpretation: normal     ECG rate:  95   ECG rate assessment: normal     Rhythm: sinus rhythm     Ectopy: none     Conduction: normal   .Critical Care  Performed by: Loleta Rose, MD Authorized by: Loleta Rose, MD   Critical care provider statement:    Critical care time (minutes):  45   Critical care time was exclusive of:  Separately billable procedures and treating other patients   Critical care was necessary to treat or prevent imminent or life-threatening deterioration of the following conditions:  Respiratory failure   Critical care was time spent personally by me on the following activities:  Development of treatment plan with patient or surrogate, evaluation of patient's response to treatment, examination of patient, obtaining history from patient or surrogate, ordering and performing treatments and interventions, ordering and review of laboratory studies, ordering and review of radiographic studies, pulse oximetry, re-evaluation of patient's condition and review of old charts     IMPRESSION / MDM / ASSESSMENT AND PLAN / ED COURSE  I reviewed the triage vital signs and the nursing notes.                              Differential diagnosis includes, but is not limited to, CHF exacerbation, pulmonary edema, pneumothorax, ACS, PE.  Patient's presentation is most consistent with acute presentation with potential threat to life or bodily function.  Labs/studies ordered: BNP, EKG, portable chest x-ray, COVID-19 PCR, CBC with differential, CMP, pro time-INR  Interventions/Medications given:  Medications  nitroGLYCERIN 50 mg in dextrose 5 % 250 mL (0.2 mg/mL) infusion (200 mcg/min Intravenous New Bag/Given 09/06/22 0559)  Chlorhexidine Gluconate Cloth 2 % PADS 6 each (has no administration in time range)  albuterol (PROVENTIL) (2.5 MG/3ML) 0.083% nebulizer  solution 2.5 mg (has no administration in time range)  dextromethorphan-guaiFENesin (MUCINEX DM) 30-600 MG per 12 hr tablet 1 tablet (has no administration in time range)  ondansetron (ZOFRAN) injection 4 mg (has no administration in time range)  hydrALAZINE (APRESOLINE) injection 10 mg (has no administration in time range)  acetaminophen (TYLENOL) tablet 650 mg (has no administration in time range)  nicotine (NICODERM CQ - dosed in mg/24 hours) patch 21 mg (has no administration in  time range)  furosemide (LASIX) 100 mg in dextrose 5 % 50 mL IVPB (0 mg Intravenous Stopped 09/06/22 0728)  calcium gluconate 1 g/ 50 mL sodium chloride IVPB (0 mg Intravenous Stopped 09/06/22 0719)    (Note:  hospital course my include additional interventions and/or labs/studies not listed above.)   Vitals notable for severe hypertension, and the patient's overall presentation is strongly suggestive of flash pulmonary edema in the setting of missing dialysis.  Patient is doing better on BiPAP.  No pain, no obvious ischemia on EKG.  Starting nitroglycerin infusion to reduce preload.  Will await results of chest x-ray to begin diuresis but likely will give a dose of furosemide IV since the patient still produces some urine.  Patient will require admission given acute respiratory failure with hypoxemia in the setting of missing dialysis  The patient is on the cardiac monitor to evaluate for evidence of arrhythmia and/or significant heart rate changes.   Clinical Course as of 09/06/22 4098  Mon Sep 06, 2022  1191 Consulted with hospitalist, Dr. Arville Care, and he will place admission orders. 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   Loleta Rose, MD 09/06/22 [phone]

## 2022-09-06 NOTE — ED Notes (Addendum)
Spoke with patients wife and gave update on pt's status and plan of care. Pt's wife requesting that admission MD contact her. This RN to contact admitting MD via secure chat.

## 2022-09-06 NOTE — Procedures (Signed)
Dialyzer clotted. blood returned successfully. Breeze,NP made aware. asked for heparin orders.

## 2022-09-06 NOTE — Procedures (Signed)
Received patient in bed.  Alert and oriented.  Informed consent signed and in chart.   TX duration: 3.5  hrs  Patient tolerated well.  Alert, without acute distress.  Hand-off given to patient's nurse.   Access used: Right chest HD catheter.  Access issues: Reversed. Arterial port sluggish.   Total UF removed: 1.5L Medication(s) given: NONE    [facility]

## 2022-09-06 NOTE — ED Notes (Signed)
Per dr. York Cerise, change dose to 200 mcg at this time.

## 2022-09-06 NOTE — ED Notes (Signed)
Pt removed from bipap, pt's 02 dropped to mid 80's. Pt placed back on bipap.

## 2022-09-06 NOTE — ED Notes (Signed)
MD Nui notified of pt's wife wanting to speak with him. Wife's contact information given to MD via secure chat.

## 2022-09-06 NOTE — ED Notes (Signed)
Request sent to lab for lasix

## 2022-09-06 NOTE — ED Notes (Signed)
Calcium 6.1 reported from lab

## 2022-09-06 NOTE — ED Notes (Signed)
Called patient's wife to provide her with requested update. Got no answer, left a message stating that he would be going to dialysis shortly. Pt's husband notified of the attempted call and voicemail left.

## 2022-09-06 NOTE — H&P (Addendum)
History and Physical    Anthony Hess WUJ:[id number] DOB: [birth year] DOA: 09/06/2022  Referring MD/NP/PA:   PCP: Ronnald Ramp, MD   Patient coming from:  The patient is coming from home.     Chief Complaint: Shortness of breath  HPI: Anthony Hess is a 67 y.o. male with medical history significant of ESRD-HD (TTS), hypertension, hyperlipidemia, PVD, CAD, CABG, diastolic CHF, anemia, atrial fibrillation on Eliquis, bladder cancer, who presents with SOB.  Patient states that he missed dialysis on Saturday, developed shortness breath since yesterday, which has been progressively worsening.  Patient is normally not using oxygen, but was found to have oxygen desaturation to 73% on room air, cannot speak in full sentence, using accessory muscle for breathing, BiPAP started in ED. Patient denies chest pain, cough, fever or chills.  No nausea, vomiting, diarrhea or abdominal pain.  No symptoms of UTI.  Data reviewed independently and ED Course: pt was found to have WBC 14.1, potassium 5.9, bicarbonate 15, creatinine 10.82, BUN 85, calcium 6.1, negative COVID PCR, temperature normal, blood pressure 230/130, which improved to 141/121 after started nitroglycerin drip, heart rate 99, 85, RR 33, 20\.  Chest x-ray showed asymmetric pulmonary edema with bilateral curly B-lines.  Patient is admitted to PCU as inpatient.  Dr. Wynelle Link of renal is consulted.   EKG: I have personally reviewed.  Sinus rhythm, LAD, poor R wave progression, T wave peaking in V3-V5.   Review of Systems:   General: no fevers, chills, no body weight gain,  has fatigue HEENT: no blurry vision, hearing changes or sore throat Respiratory: has dyspnea, no coughing, wheezing CV: no chest pain, no palpitations GI: no nausea, vomiting, abdominal pain, diarrhea, constipation GU: no dysuria, burning on urination, increased urinary frequency, hematuria  Ext: has trace leg edema Neuro: no unilateral weakness, numbness, or  tingling, no vision change or hearing loss Skin: no rash, no skin tear. MSK: No muscle spasm, no deformity, no limitation of range of movement in spin Heme: No easy bruising.  Travel history: No recent long distant travel.   Allergy: No Known Allergies  Past Medical History:  Diagnosis Date   Aortic atherosclerosis (HCC)    Bilateral carotid artery disease (HCC)    Bladder cancer (HCC)    Coronary artery disease 12/20/2018   a.) LHC 12/20/2018: 50% OM1, 40% OM2, 95% o-pLAD, 75% o=pLCx, 40% mLM, 70% D1, 60% mRCA-1, 50% mRCA-2; refer to CVTS. b.) 4v CABG at [facility] on 12/27/2018: LIMA-LAD, RIMA-PDA, seg LRA-OM1-D1   DCM (dilated cardiomyopathy) (HCC) 12/05/2018   a.) TTE 12/05/2018: EF 40-45%. b.) TTE 12/28/2019: EF 20-25%.   ESRD (end stage renal disease) (HCC)    a.) T-Th-Sat   HFrEF (heart failure with reduced ejection fraction) (HCC) 12/05/2018   a.) TTE 12/05/2018: EF 40-45%; mild LVH; ant/apical/sep HK; mild TR . b.) TTE 12/28/2019: EF 20-25%; mod LVH; mod MR/AR; G1DD.   History of 2019 novel coronavirus disease (COVID-19) 04/08/2021   History of kidney stones    HLD (hyperlipidemia)    Hx of CABG 12/27/2018   Hypertension    Infrarenal abdominal aortic aneurysm (AAA) without rupture (HCC) 03/05/2021   a.) CT abd/pelvis; measured 3.2 cm.   Melena 05/04/2022   Myocardial infarction [facility])    NSTEMI (non-ST elevated myocardial infarction) (HCC) 04/19/2022   Perforation bowel (HCC) 04/26/2022   PVD (peripheral vascular disease) (HCC)    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   a.) LIMA-LAD, RIMA-PDA, sequential LEFT radial artery to OM1 and D1   Sepsis (  HCC) 03/14/2021   Wears glasses     Past Surgical History:  Procedure Laterality Date   AV FISTULA PLACEMENT Left 07/30/2021   Procedure: INSERTION OF ARTERIOVENOUS (AV) GORE-TEX GRAFT ARM BRACHIAL ARTERY TO AXILLARY VEIN;  Surgeon: Annice Needy, MD;  Location: [facility] ORS;  Service: Vascular;  Laterality: Left;   CAPD INSERTION N/A 12/31/2019    Procedure: LAPAROSCOPIC INSERTION CONTINUOUS AMBULATORY PERITONEAL DIALYSIS  (CAPD) CATHETER;  Surgeon: Leafy Ro, MD;  Location: [facility];  Service: General;  Laterality: N/A;   CAPD REMOVAL N/A 04/10/2020   Procedure: LAPAROSCOPIC REVISION OF CONTINUOUS AMBULATORY PERITONEAL DIALYSIS  (CAPD) CATHETER;  Surgeon: Leafy Ro, MD;  Location: [facility];  Service: General;  Laterality: N/A;   CORONARY ARTERY BYPASS GRAFT N/A 12/27/2018   Procedure: CORONARY ARTERY BYPASS GRAFTING (CABG) X 4 ON PUMP USING RIGHT & LEFT INTERNAL MAMMARY ARTERY LEFT RADIAL ARTERY ENDOSCOPICALLY HARVESTED;  Surgeon: Linden Dolin, MD;  [facility];  Service: Open Heart Surgery;  Laterality: N/A;   CYSTOSCOPY W/ RETROGRADES Bilateral 05/15/2019   Procedure: CYSTOSCOPY WITH RETROGRADE PYELOGRAM;  Surgeon: Riki Altes, MD;  Location: [facility];  Service: Urology;  Laterality: Bilateral;   CYSTOSCOPY WITH BIOPSY N/A 05/15/2019   Procedure: CYSTOSCOPY WITH bladder BIOPSY;  Surgeon: Riki Altes, MD;  Location: [facility];  Service: Urology;  Laterality: N/A;   DIALYSIS/PERMA CATHETER INSERTION N/A 12/28/2019   Procedure: DIALYSIS/PERMA CATHETER INSERTION;  Surgeon: Annice Needy, MD;  Location: [facility];  Service: Cardiovascular;  Laterality: N/A;   DIALYSIS/PERMA CATHETER INSERTION N/A 03/18/2021   Procedure: DIALYSIS/PERMA CATHETER INSERTION;  Surgeon: Annice Needy, MD;  Location: [facility];  Service: Cardiovascular;  Laterality: N/A;   DIALYSIS/PERMA CATHETER REMOVAL N/A 06/02/2020   Procedure: DIALYSIS/PERMA CATHETER REMOVAL;  Surgeon: Annice Needy, MD;  Location: [facility];  Service: Cardiovascular;  Laterality: N/A;   EXCHANGE OF A DIALYSIS CATHETER Right 04/10/2020   Procedure: EXCHANGE OF A DIALYSIS CATHETER;  Surgeon: Leafy Ro, MD;  Location: [facility];  Service: General;  Laterality: Right;   INCISIONAL HERNIA REPAIR  01/20/2021   Procedure: HERNIA REPAIR INCISIONAL;   Surgeon: Henrene Dodge, MD;  Location: [facility];  Service: General;;   IR IMAGE GUIDED DRAINAGE PERCUT CATH  PERITONEAL RETROPERIT  04/07/2020   LAPAROSCOPY N/A 04/16/2021   Procedure: LAPAROSCOPY DIAGNOSTIC;  Surgeon: Sung Amabile, DO;  Location: [facility];  Service: General;  Laterality: N/A;   LAPAROTOMY N/A 04/26/2022   Procedure: EXPLORATORY LAPAROTOMY WITH REPAIR OF DUODENAL ULCER;  Surgeon: Carolan Shiver, MD;  Location: [facility];  Service: General;  Laterality: N/A;   LEFT HEART CATH AND CORONARY ANGIOGRAPHY Left 12/20/2018   Procedure: LEFT HEART CATH AND CORONARY ANGIOGRAPHY;  Surgeon: Marcina Millard, MD;  Location: [facility];  Service: Cardiovascular;  Laterality: Left;   RADIAL ARTERY HARVEST Left 12/27/2018   Procedure: ENDOSCOPIC RADIAL ARTERY HARVEST;  Surgeon: Linden Dolin, MD;  Location: [facility];  Service: Open Heart Surgery;  Laterality: Left;   REMOVAL OF A DIALYSIS CATHETER Left 03/20/2021   Procedure: REMOVAL OF A PD CATHETER;  Surgeon: Annice Needy, MD;  Location: [facility];  Service: Vascular;  Laterality: Left;   REVISION OF ARTERIOVENOUS GORETEX GRAFT Left 09/11/2021   Procedure: Excisionof infected AV graft;  Surgeon: Renford Dills, MD;  Location: [facility];  Service: Vascular;  Laterality: Left;   TEE WITHOUT CARDIOVERSION N/A 12/27/2018   Procedure: TRANSESOPHAGEAL ECHOCARDIOGRAM (  TEE);  Surgeon: Linden Dolin, MD;  Location: [facility] OR;  Service: Open Heart Surgery;  Laterality: N/A;   TUMOR REMOVAL  2019   Bladder    Social History:  reports that he has been smoking cigarettes. He has never used smokeless tobacco. He reports that he does not drink alcohol and does not use drugs.  Family History:  Family History  Problem Relation Age of Onset   Heart failure Mother      Prior to Admission medications   Medication Sig Start Date End Date Taking? Authorizing Provider  acetaminophen (TYLENOL) 500 MG tablet Take 2 tablets (1,000 mg total) by  mouth every 6 (six) hours as needed for mild pain, fever or headache (or Fever >/= 101). 11/18/21   Sunnie Nielsen, DO  amLODipine (NORVASC) 2.5 MG tablet Take 1 tablet (2.5 mg total) by mouth daily. 05/07/22   Alford Highland, MD  apixaban (ELIQUIS) 5 MG TABS tablet Take 1 tablet (5 mg total) by mouth 2 (two) times daily. 05/01/22   Baron Hamper, MD  Aspirin 81 MG CAPS Take by mouth. 05/06/20   [provider]  atorvastatin (LIPITOR) 80 MG tablet Take 1 tablet (80 mg total) by mouth daily. Patient not taking: Reported on 08/17/2022 04/21/22   Lurene Shadow, MD  calcitRIOL (ROCALTROL) 0.5 MCG capsule Take 1 capsule (0.5 mcg total) by mouth daily. 11/18/21   Sunnie Nielsen, DO  calcium acetate (PHOSLO) 667 MG capsule Take 1 capsule (667 mg total) by mouth 3 (three) times daily with meals. 11/18/21   Sunnie Nielsen, DO  hydrALAZINE (APRESOLINE) 25 MG tablet Take 1 tablet (25 mg total) by mouth 3 (three) times daily. Patient not taking: Reported on 08/17/2022 04/21/22   Lurene Shadow, MD  isosorbide dinitrate (ISORDIL) 10 MG tablet Take 1 tablet (10 mg total) by mouth 3 (three) times daily. 04/21/22   Lurene Shadow, MD  metoprolol succinate (TOPROL-XL) 25 MG 24 hr tablet Take 25 mg by mouth daily. 03/12/22   [provider]  multivitamin (RENA-VIT) TABS tablet Take 1 tablet by mouth at bedtime. 03/23/21   Esaw Grandchild A, DO  ondansetron (ZOFRAN-ODT) 4 MG disintegrating tablet Take 1 tablet (4 mg total) by mouth every 8 (eight) hours as needed for nausea or vomiting. 06/25/22   Simmons-Robinson, Makiera, MD  pantoprazole (PROTONIX) 40 MG tablet Take 1 tablet (40 mg total) by mouth daily. 05/07/22 06/06/22  Alford Highland, MD  RENVELA 800 MG tablet Take 800 mg by mouth 3 (three) times daily with meals. 07/27/22   [provider]  sucroferric oxyhydroxide (VELPHORO) 500 MG chewable tablet Chew 1 tablet (500 mg total) by mouth 3 (three) times daily with meals. 11/18/21    Sunnie Nielsen, DO  Vitamin D, Ergocalciferol, (DRISDOL) 1.25 MG (50000 UNIT) CAPS capsule Take 1 capsule (50,000 Units total) by mouth every 7 (seven) days. 11/18/21   Sunnie Nielsen, DO    Physical Exam: Vitals:   09/06/22 1215 09/06/22 1230 09/06/22 1241 09/06/22 1245  BP: (!) 146/118 (!) 147/120 (!) 127/102 (!) 132/99  Pulse: 99 100 (!) 107 (!) 105  Resp: 18     Temp:      TempSrc:      SpO2: 100% 100% 96% 100%  Weight:      Height:       General: in acute respiratory distress HEENT:       Eyes: PERRL, EOMI, no jaundice       ENT: No discharge from the ears and nose, no  pharynx injection, no tonsillar enlargement.        Neck: Positive JVD, no bruit, no mass felt. Heme: No neck lymph node enlargement. Cardiac: S1/S2, RRR, No murmurs, No gallops or rubs. Respiratory: Has fine crackles bilaterally GI: Soft, nondistended, nontender, no rebound pain, no organomegaly, BS present. GU: No hematuria Ext: has trace leg edema bilaterally. 1+DP/PT pulse bilaterally. Musculoskeletal: No joint deformities, No joint redness or warmth, no limitation of ROM in spin. Skin: No rashes.  Neuro: Alert, oriented X3, cranial nerves II-XII grossly intact, moves all extremities normally.  Psych: Patient is not psychotic, no suicidal or hemocidal ideation.  Labs on Admission: I have personally reviewed following labs and imaging studies  CBC: Recent Labs  Lab 09/03/22 0052 09/06/22 0516  WBC 8.8 14.0*  NEUTROABS  --  11.1*  HGB 10.7* 10.4*  HCT 35.6* 33.9*  MCV 94.4 92.4  PLT 247 346   Basic Metabolic Panel: Recent Labs  Lab 09/03/22 0052 09/06/22 0516  NA 140 139  K 4.8 5.9*  CL 100 107  CO2 25 15*  GLUCOSE 131* 113*  BUN 33* 85*  CREATININE 6.54* 10.82*  CALCIUM 7.6* 6.1*   GFR: Estimated Creatinine Clearance: 5.3 mL/min (A) (by C-G formula based on SCr of 10.82 mg/dL (H)). Liver Function Tests: Recent Labs  Lab 09/03/22 0052 09/06/22 0516  AST 19 21  ALT 17 21   ALKPHOS 57 52  BILITOT 0.3 0.4  PROT 6.5 6.4*  ALBUMIN 3.2* 3.1*   Recent Labs  Lab 09/03/22 0052  LIPASE 46   No results for input(s): "AMMONIA" in the last 168 hours. Coagulation Profile: Recent Labs  Lab 09/06/22 0516  INR 1.1   Cardiac Enzymes: No results for input(s): "CKTOTAL", "CKMB", "CKMBINDEX", "TROPONINI" in the last 168 hours. BNP (last 3 results) No results for input(s): "PROBNP" in the last 8760 hours. HbA1C: No results for input(s): "HGBA1C" in the last 72 hours. CBG: No results for input(s): "GLUCAP" in the last 168 hours. Lipid Profile: No results for input(s): "CHOL", "HDL", "LDLCALC", "TRIG", "CHOLHDL", "LDLDIRECT" in the last 72 hours. Thyroid Function Tests: No results for input(s): "TSH", "T4TOTAL", "FREET4", "T3FREE", "THYROIDAB" in the last 72 hours. Anemia Panel: No results for input(s): "VITAMINB12", "FOLATE", "FERRITIN", "TIBC", "IRON", "RETICCTPCT" in the last 72 hours. Urine analysis:    Component Value Date/Time   COLORURINE YELLOW (A) 08/10/2022 1634   APPEARANCEUR TURBID (A) 08/10/2022 1634   APPEARANCEUR Cloudy (A) 05/08/2019 1448   LABSPEC 1.017 08/10/2022 1634   PHURINE 7.0 08/10/2022 1634   GLUCOSEU NEGATIVE 08/10/2022 1634   HGBUR SMALL (A) 08/10/2022 1634   BILIRUBINUR NEGATIVE 08/10/2022 1634   BILIRUBINUR Negative 05/08/2019 1448   KETONESUR NEGATIVE 08/10/2022 1634   PROTEINUR >=300 (A) 08/10/2022 1634   NITRITE NEGATIVE 08/10/2022 1634   LEUKOCYTESUR MODERATE (A) 08/10/2022 1634   Sepsis Labs: @LABRCNTIP (procalcitonin:4,lacticidven:4) ) Recent Results (from the past 240 hour(s))  SARS Coronavirus 2 by RT PCR (hospital order, performed in [facility] hospital lab) *cepheid single result test* Anterior Nasal Swab     Status: None   Collection Time: 09/06/22  4:43 AM   Specimen: Anterior Nasal Swab  Result Value Ref Range Status   SARS Coronavirus 2 by RT PCR NEGATIVE NEGATIVE Final    Comment: (NOTE) SARS-CoV-2 target  nucleic acids are NOT DETECTED.  The SARS-CoV-2 RNA is generally detectable in upper and lower respiratory specimens during the acute phase of infection. The lowest concentration of SARS-CoV-2 viral copies this assay can detect  is 250 copies / mL. A negative result does not preclude SARS-CoV-2 infection and should not be used as the sole basis for treatment or other patient management decisions.  A negative result may occur with improper specimen collection / handling, submission of specimen other than nasopharyngeal swab, presence of viral mutation(s) within the areas targeted by this assay, and inadequate number of viral copies (<250 copies / mL). A negative result must be combined with clinical observations, patient history, and epidemiological information.  Fact Sheet for Patients:   RoadLapTop.co.za  Fact Sheet for Healthcare Providers: http://kim-miller.com/  This test is not yet approved or  cleared by the [place] FDA and has been authorized for detection and/or diagnosis of SARS-CoV-2 by FDA under an Emergency Use Authorization (EUA).  This EUA will remain in effect (meaning this test can be used) for the duration of the COVID-19 declaration under Section 564(b)(1) of the Act, 21 U.S.C. section 360bbb-3(b)(1), unless the authorization is terminated or revoked sooner.  Performed at [facility], [address], [place], [place] [address]      Radiological Exams on Admission: DG Chest [place] 1 View  Result Date: 09/06/2022 CLINICAL DATA:  67 year old male with possible sepsis. Missed dialysis over the weekend. EXAM: PORTABLE CHEST 1 VIEW COMPARISON:  Chest radiograph 08/17/2022 and earlier. FINDINGS: Portable AP upright view at 0509 hours. Stable right chest dual lumen dialysis type catheter. Chronic sternotomy, CABG. Dextroconvex scoliosis. Patchy and indistinct acute left perihilar and mid lung opacity, but also  evidence of Kerley B lines bilaterally. Stable mediastinal contours when allowing for portable technique. No pneumothorax. No air bronchograms. No pleural effusion is evident. Negative visible bowel gas. No acute osseous abnormality identified. IMPRESSION: 1. Acute new indistinct left perihilar opacity, but bilateral Kerley B lines also visible. Favor asymmetric acute pulmonary edema. Superimposed left lung aspiration or infection difficult to exclude. 2. Stable right chest dialysis catheter. Chronic sternotomy, CABG. Electronically Signed   By: Odessa Fleming M.D.   On: 09/06/2022 05:29      Assessment/Plan Principal Problem:   Acute respiratory failure with hypoxia (HCC) Active Problems:   Fluid overload   Hypertensive emergency   End-stage renal disease on hemodialysis (HCC)   (HFpEF) heart failure with preserved ejection fraction (HCC)   Atrial fibrillation, chronic (HCC)   Pulmonary embolism (HCC)   HLD (hyperlipidemia)   CAD (coronary artery disease)   Leukocytosis   Cigarette smoker   Hypocalcemia   Hyperkalemia   Assessment and Plan:  Acute respiratory failure with hypoxia due to Fluid overload: This is due to dialysis noncompliance and hypertensive emergency  -Will admit to PCU as inpatient -Dr. Wynelle Link of renal is consulted, started urgent dialysis -Wean off BiPAP -Daily weights -strict I/O's -Fluid restriction -As needed bronchodilators for shortness of breath  Hypertensive emergency -IV hydralazine as needed -Nitroglycerin drip -Amlodipine, Isordil, metoprolol, oral hydralazine  End-stage renal disease on hemodialysis (HCC) -Urgent dialysis by Dr. Wynelle Link  (HFpEF) heart failure with preserved ejection fraction [facility]): 2D echo on 04/19/2022 showed EF 55 to 60% with grade 1 diastolic dysfunction.  Now patient has fluid overload -Volume management per renal by dialysis  Atrial fibrillation, chronic (HCC): Heart rate 80-90s -Eliquis -Metoprolol  Pulmonary embolism  (HCC) -Eliquis  HLD (hyperlipidemia) -Lipitor  CAD (coronary artery disease) -Lipitor  Leukocytosis: WBC 14.1, no source of infection identified, likely reactive  Cigarette smoker -Nicotine patch  Hypocalcemia and Hyperkalemia: Calcium 6.1, potassium 5.9 -Expect correction with dialysis -Patient received 1 g calcium gluconate in  ED    DVT ppx: on Eliquis  Code Status: Full code     Family Communication:   Yes, patient's wife   by phone  Disposition Plan:  Anticipate discharge back to previous environment  Consults called:  Dr. Wynelle Link of renal  Admission status and Level of care: Progressive:    as inpt        Dispo: The patient is from: Home              Anticipated d/c is to: Home              Anticipated d/c date is: 2 days              Patient currently is not medically stable to d/c.    Severity of Illness:  The appropriate patient status for this patient is INPATIENT. Inpatient status is judged to be reasonable and necessary in order to provide the required intensity of service to ensure the patient's safety. The patient's presenting symptoms, physical exam findings, and initial radiographic and laboratory data in the context of their chronic comorbidities is felt to place them at high risk for further clinical deterioration. Furthermore, it is not anticipated that the patient will be medically stable for discharge from the hospital within 2 midnights of admission.   * I certify that at the point of admission it is my clinical judgment that the patient will require inpatient hospital care spanning beyond 2 midnights from the point of admission due to high intensity of service, high risk for further deterioration and high frequency of surveillance required.*       Date of Service 09/06/2022    Lorretta Harp [facility]   If 7PM-7AM, please contact night-coverage [email or web address] 09/06/2022, 1:01 PM

## 2022-09-06 NOTE — ED Notes (Signed)
Per Dialysis RN, pt attempted to be removed from bipap, upon removal o2 sat's dropped to the mid to low 80's, pt placed back on bipap.

## 2022-09-06 NOTE — TOC CM/SW Note (Signed)
Received TOC consulted for SNF Placement. Pt to await PT/OT evaluation. Cm will continue to follow for needs.

## 2022-09-06 NOTE — ED Triage Notes (Signed)
Patient arrived to ED from home via [facility] due to shortness of breath. Upon arrival, EMS states that patient was tripoding on the front porch. Patient missed dialysis on Saturday and last day was Thursday.  Patient was 73% on RA and placed on Bipap in the field.

## 2022-09-06 NOTE — ED Notes (Signed)
Reported critical lab calcium of 6.1 to dr.forbach

## 2022-09-06 NOTE — ED Notes (Signed)
Dialysis at bedside

## 2022-09-06 NOTE — Progress Notes (Signed)
[facility]  ROUNDING NOTE   Subjective:   Anthony Hess is a 67 year old male with past medical conditions including hypertension, PVD, dyslipidemia, CAD, four-vessel CABG, and end-stage renal disease on hemodialysis. Patient presents to the emergency department with complaints of shortness of breath. Paietn has been admitted for Acute respiratory failure with hypoxia (HCC) [J96.01]   Patient is known to our practice and receives outpatient dialysis at [facility], followed by Dr Cherylann Ratel. He says he missed treatment on Saturday due to transportation. HE says he has moved back to [place] and missed his ride. He would like to transfer to a clinic closer to his present address. Denies excessive drinking or salt intake.   Labs on ED arrival significant for potassium 5.9, bicarb 15, BUN 85, and creatinine 10.82 with GFR 5. BNP > 4500. Negative for Covid 19.   We have been consulted to manage dialysis needs.   Objective:  Vital signs in last 24 hours:  Temp:  [97.1 F (36.2 C)-97.7 F (36.5 C)] 97.1 F (36.2 C) (07/15 1325) Pulse Rate:  [79-107] 102 (07/15 1315) Resp:  [12-33] 18 (07/15 1315) BP: (127-224)/(99-151) 153/123 (07/15 1315) SpO2:  [94 %-100 %] 100 % (07/15 1315) FiO2 (%):  [30 %] 30 % (07/15 0748) Weight:  [60.2 kg-60.3 kg] 60.2 kg (07/15 0939)  Weight change:  Filed Weights   09/06/22 0438 09/06/22 0939  Weight: 60.3 kg 60.2 kg    Intake/Output: No intake/output data recorded.   Intake/Output this shift:  No intake/output data recorded.  Physical Exam: General: NAD  Head: Normocephalic, atraumatic.   Eyes: Anicteric  Lungs:  Crackles present, Bipap  Heart: Regular rate and rhythm  Abdomen:  Soft, nontender,   Extremities:  No peripheral edema.  Neurologic: Nonfocal, moving all four extremities  Skin: No lesions  Access: Rt chest permcath    Basic Metabolic Panel: Recent Labs  Lab 09/03/22 0052 09/06/22 0516  NA 140 139  K 4.8 5.9*  CL  100 107  CO2 25 15*  GLUCOSE 131* 113*  BUN 33* 85*  CREATININE 6.54* 10.82*  CALCIUM 7.6* 6.1*    Liver Function Tests: Recent Labs  Lab 09/03/22 0052 09/06/22 0516  AST 19 21  ALT 17 21  ALKPHOS 57 52  BILITOT 0.3 0.4  PROT 6.5 6.4*  ALBUMIN 3.2* 3.1*   Recent Labs  Lab 09/03/22 0052  LIPASE 46   No results for input(s): "AMMONIA" in the last 168 hours.  CBC: Recent Labs  Lab 09/03/22 0052 09/06/22 0516  WBC 8.8 14.0*  NEUTROABS  --  11.1*  HGB 10.7* 10.4*  HCT 35.6* 33.9*  MCV 94.4 92.4  PLT 247 346    Cardiac Enzymes: No results for input(s): "CKTOTAL", "CKMB", "CKMBINDEX", "TROPONINI" in the last 168 hours.  BNP: Invalid input(s): "POCBNP"  CBG: No results for input(s): "GLUCAP" in the last 168 hours.  Microbiology: Results for orders placed or performed during the hospital encounter of 09/06/22  SARS Coronavirus 2 by RT PCR (hospital order, performed in [facility] hospital lab) *cepheid single result test* Anterior Nasal Swab     Status: None   Collection Time: 09/06/22  4:43 AM   Specimen: Anterior Nasal Swab  Result Value Ref Range Status   SARS Coronavirus 2 by RT PCR NEGATIVE NEGATIVE Final    Comment: (NOTE) SARS-CoV-2 target nucleic acids are NOT DETECTED.  The SARS-CoV-2 RNA is generally detectable in upper and lower respiratory specimens during the acute phase of infection. The lowest  concentration of SARS-CoV-2 viral copies this assay can detect is 250 copies / mL. A negative result does not preclude SARS-CoV-2 infection and should not be used as the sole basis for treatment or other patient management decisions.  A negative result may occur with improper specimen collection / handling, submission of specimen other than nasopharyngeal swab, presence of viral mutation(s) within the areas targeted by this assay, and inadequate number of viral copies (<250 copies / mL). A negative result must be combined with clinical observations,  patient history, and epidemiological information.  Fact Sheet for Patients:   RoadLapTop.co.za  Fact Sheet for Healthcare Providers: http://kim-miller.com/  This test is not yet approved or  cleared by the [place] FDA and has been authorized for detection and/or diagnosis of SARS-CoV-2 by FDA under an Emergency Use Authorization (EUA).  This EUA will remain in effect (meaning this test can be used) for the duration of the COVID-19 declaration under Section 564(b)(1) of the Act, 21 U.S.C. section 360bbb-3(b)(1), unless the authorization is terminated or revoked sooner.  Performed at [facility], [address] [place], [place] [address]     Coagulation Studies: Recent Labs    09/06/22 0516  LABPROT 14.5  INR 1.1    Urinalysis: No results for input(s): "COLORURINE", "LABSPEC", "PHURINE", "GLUCOSEU", "HGBUR", "BILIRUBINUR", "KETONESUR", "PROTEINUR", "UROBILINOGEN", "NITRITE", "LEUKOCYTESUR" in the last 72 hours.  Invalid input(s): "APPERANCEUR"    Imaging: DG Chest Port 1 View  Result Date: 09/06/2022 CLINICAL DATA:  67 year old male with possible sepsis. Missed dialysis over the weekend. EXAM: PORTABLE CHEST 1 VIEW COMPARISON:  Chest radiograph 08/17/2022 and earlier. FINDINGS: Portable AP upright view at 0509 hours. Stable right chest dual lumen dialysis type catheter. Chronic sternotomy, CABG. Dextroconvex scoliosis. Patchy and indistinct acute left perihilar and mid lung opacity, but also evidence of Kerley B lines bilaterally. Stable mediastinal contours when allowing for portable technique. No pneumothorax. No air bronchograms. No pleural effusion is evident. Negative visible bowel gas. No acute osseous abnormality identified. IMPRESSION: 1. Acute new indistinct left perihilar opacity, but bilateral Kerley B lines also visible. Favor asymmetric acute pulmonary edema. Superimposed left lung aspiration or infection  difficult to exclude. 2. Stable right chest dialysis catheter. Chronic sternotomy, CABG. Electronically Signed   By: Odessa Fleming M.D.   On: 09/06/2022 05:29     Medications:    nitroGLYCERIN 200 mcg/min (09/06/22 1024)    amLODipine  2.5 mg Oral Daily   apixaban  2.5 mg Oral BID   atorvastatin  80 mg Oral Daily   calcitRIOL  0.5 mcg Oral Daily   calcium acetate  667 mg Oral TID WC   Chlorhexidine Gluconate Cloth  6 each Topical Q0600   hydrALAZINE  25 mg Oral TID   metoprolol succinate  25 mg Oral Daily   nicotine  21 mg Transdermal Daily   pantoprazole  40 mg Oral Daily   acetaminophen, albuterol, dextromethorphan-guaiFENesin, heparin, heparin, hydrALAZINE, ondansetron (ZOFRAN) IV  Assessment/ Plan:  Anthony Hess is a 67 y.o.  male with past medical conditions including hypertension, PVD, dyslipidemia, CAD, four-vessel CABG, and end-stage renal disease on hemodialysis. Patient presents to the emergency department with complaints of shortness of breath. He will be admitted for Acute respiratory failure with hypoxia (HCC) [J96.01]   End stage renal disease on hemodialysis. Last treatment completed on Thursday. Missed treatment Saturday due to transportation. Receiving urgent dialysis today. UF goal 1.5L as tolerated. Next treatment scheduled for Tuesday.   2. Acute respiratory failure requiring  bipap. Chest xray suspicious for infection with pulmonary edema. Will attempt to wean Bipap with dialysis   3.Anemia of chronic kidney disease Lab Results  Component Value Date   HGB 10.4 (L) 09/06/2022    Hgb within desired range.   4. Secondary Hyperparathyroidism: with outpatient labs: None available    Lab Results  Component Value Date   PTH 170 (H) 06/25/2022   CALCIUM 6.1 (LL) 09/06/2022   CAION 0.84 (LL) 09/11/2021   PHOS 8.9 (H) 05/01/2022    Bon minerals not acceptable. Continue calcitriol and calcium acetate with meals.   5. Hypertension with chronic kidney disease. Home  regimen includes amlodipine, hydralazine, isosorbide, and metoprolol.    LOS: 0 Evin Chirco 7/15/20241:29 PM

## 2022-09-07 DIAGNOSIS — J9601 Acute respiratory failure with hypoxia: Secondary | ICD-10-CM | POA: Diagnosis not present

## 2022-09-07 LAB — CBC
HCT: 32.1 % — ABNORMAL LOW (ref 39.0–52.0)
Hemoglobin: 10.3 g/dL — ABNORMAL LOW (ref 13.0–17.0)
MCH: 28.8 pg (ref 26.0–34.0)
MCHC: 32.1 g/dL (ref 30.0–36.0)
MCV: 89.7 fL (ref 80.0–100.0)
Platelets: 348 10*3/uL (ref 150–400)
RBC: 3.58 MIL/uL — ABNORMAL LOW (ref 4.22–5.81)
RDW: 15.6 % — ABNORMAL HIGH (ref 11.5–15.5)
WBC: 13 10*3/uL — ABNORMAL HIGH (ref 4.0–10.5)
nRBC: 0 % (ref 0.0–0.2)

## 2022-09-07 LAB — BASIC METABOLIC PANEL
Anion gap: 13 (ref 5–15)
BUN: 51 mg/dL — ABNORMAL HIGH (ref 8–23)
CO2: 25 mmol/L (ref 22–32)
Calcium: 6.7 mg/dL — ABNORMAL LOW (ref 8.9–10.3)
Chloride: 98 mmol/L (ref 98–111)
Creatinine, Ser: 6.65 mg/dL — ABNORMAL HIGH (ref 0.61–1.24)
GFR, Estimated: 9 mL/min — ABNORMAL LOW (ref >60)
Glucose, Bld: 93 mg/dL (ref 70–99)
Potassium: 4.2 mmol/L (ref 3.5–5.1)
Sodium: 136 mmol/L (ref 135–145)

## 2022-09-07 LAB — ALBUMIN: Albumin: 2.9 g/dL — ABNORMAL LOW (ref 3.5–5.0)

## 2022-09-07 LAB — HEPATITIS B SURFACE ANTIBODY, QUANTITATIVE: Hep B S AB Quant (Post): 27.6 m[IU]/mL

## 2022-09-07 MED ORDER — CLONIDINE HCL 0.1 MG PO TABS
ORAL_TABLET | ORAL | Status: AC
  Filled 2022-09-07: qty 1

## 2022-09-07 MED ORDER — CLONIDINE HCL 0.1 MG PO TABS
0.1000 mg | ORAL_TABLET | Freq: Once | ORAL | Status: AC
  Administered 2022-09-07: 0.1 mg via ORAL

## 2022-09-07 MED ORDER — LABETALOL HCL 5 MG/ML IV SOLN: 10.0000 mg | INTRAVENOUS | Status: DC | PRN

## 2022-09-07 NOTE — Assessment & Plan Note (Signed)
 Lipitor 

## 2022-09-07 NOTE — Assessment & Plan Note (Signed)
-  Nicotine patch 

## 2022-09-07 NOTE — Hospital Course (Signed)
HPI on admission 09/06/22 by Dr. Clyde Lundborg: "Anthony Hess is a 67 y.o. male with medical history significant of ESRD-HD (TTS), hypertension, hyperlipidemia, PVD, CAD, CABG, diastolic CHF, anemia, atrial fibrillation on Eliquis, bladder cancer, who presents with SOB.   Patient states that he missed dialysis on Saturday, developed shortness breath since yesterday, which has been progressively worsening.  Patient is normally not using oxygen, but was found to have oxygen desaturation to 73% on room air, cannot speak in full sentence, using accessory muscle for breathing, BiPAP started in ED. Patient denies chest pain, cough, fever or chills.  No nausea, vomiting, diarrhea or abdominal pain.  No symptoms of UTI.   Data reviewed independently and ED Course: pt was found to have WBC 14.1, potassium 5.9, bicarbonate 15, creatinine 10.82, BUN 85, calcium 6.1, negative COVID PCR, temperature normal, blood pressure 230/130, which improved to 141/121 after started nitroglycerin drip, heart rate 99, 85, RR 33, 20.  Chest x-ray showed asymmetric pulmonary edema with bilateral curly B-lines.  Patient is admitted to PCU as inpatient.  Dr. Wynelle Link of renal is consulted."  Patient underwent urgent dialysis and respiratory status improved to baseline.  7/16: pt hoped to d/c after dialysis this afternoon, but BP is again uncontrolled in 190's/130's.

## 2022-09-07 NOTE — Assessment & Plan Note (Signed)
Resolved with dialysis Monitor BMP

## 2022-09-07 NOTE — Assessment & Plan Note (Signed)
2D echo on 04/19/2022 showed EF 55 to 60% with grade 1 diastolic dysfunction.  Now patient has fluid overload -Volume management by dialysis

## 2022-09-07 NOTE — Assessment & Plan Note (Signed)
Nephrology following for dialysis.

## 2022-09-07 NOTE — Progress Notes (Signed)
  Received patient in bed to unit.   Informed consent signed and in chart.    TX duration: 3hrs     Transported back to floor Hand-off given to patient's nurse. No c/o and no distress noted    Access used: R HD catheter  Access issues: none   Total UF removed: 1.5L Medication(s) given: 0.1mg  clonidine  Post HD VS: 174/107 Post HD weight: 55.0KG     Lynann Beaver  [facility]

## 2022-09-07 NOTE — Discharge Planning (Signed)
ESTABLISHED HEMODIALYSIS  Outpatient Facility [facility] [address] [place], [place] [address] [phone]  Schedule: Tuesday Thursday and Saturday @ 6:00am  Patient informed me that he was in the process of transferring clinics.  Per French Ana, patient did request a transfer to [facility]. However, [facility] does not have capacity at this time. Plan is for patient to resume above schedule at discharge.  Patient mentioned to Nephrology transportation concerns. Per French Ana, patient has established transportation on a [employer], could remember which company, and that she was not aware of any transportation concerns. Patient informed me, that he only missed the last treatment because he "overslept and missed the van".   No other concerns mentioned.   Dimas Chyle Dialysis Coordinator II  [facility] Cell: [phone] eFax: [phone] [email or web address]

## 2022-09-07 NOTE — Progress Notes (Signed)
Progress Note   Patient: Anthony Hess YQM:[id number] DOB: 05/19/1955 DOA: 09/06/2022     1 DOS: the patient was seen and examined on 09/07/2022   Brief hospital course: HPI on admission 09/06/22 by Dr. Clyde Lundborg: "Anthony Hess is a 66 y.o. male with medical history significant of ESRD-HD (TTS), hypertension, hyperlipidemia, PVD, CAD, CABG, diastolic CHF, anemia, atrial fibrillation on Eliquis, bladder cancer, who presents with SOB.   Patient states that he missed dialysis on Saturday, developed shortness breath since yesterday, which has been progressively worsening.  Patient is normally not using oxygen, but was found to have oxygen desaturation to 73% on room air, cannot speak in full sentence, using accessory muscle for breathing, BiPAP started in ED. Patient denies chest pain, cough, fever or chills.  No nausea, vomiting, diarrhea or abdominal pain.  No symptoms of UTI.   Data reviewed independently and ED Course: pt was found to have WBC 14.1, potassium 5.9, bicarbonate 15, creatinine 10.82, BUN 85, calcium 6.1, negative COVID PCR, temperature normal, blood pressure 230/130, which improved to 141/121 after started nitroglycerin drip, heart rate 99, 85, RR 33, 20.  Chest x-ray showed asymmetric pulmonary edema with bilateral curly B-lines.  Patient is admitted to PCU as inpatient.  Dr. Wynelle Link of renal is consulted."  Patient underwent urgent dialysis and respiratory status improved to baseline.  7/16: pt hoped to d/c after dialysis this afternoon, but BP is again uncontrolled in 190's/130's.  Assessment and Plan: * Acute respiratory failure with hypoxia (HCC) This is due to dialysis noncompliance and hypertensive emergency  -Nephrology consulted -s/p urgent dialysis on admission and today 7/16 -Now weaned off BiPAP & stable -Daily weights -strict I/O's -Fluid restriction -As needed bronchodilators -BP control as outlined  Fluid overload Volume mgmt by dialysis  Hypertensive  emergency Pt off nitroglycerin drip around 945 this AM. -IV hydralazine and IV labetalol PRN -Amlodipine, Isordil, metoprolol, oral hydralazine -Resume nitroglycerin drip if refractory to PRN IV meds  End-stage renal disease on hemodialysis [facility]) Nephrology following for dialysis  (HFpEF) heart failure with preserved ejection fraction (HCC) 2D echo on 04/19/2022 showed EF 55 to 60% with grade 1 diastolic dysfunction.  Now patient has fluid overload -Volume management by dialysis  Pulmonary embolism (HCC) Continue Eliquis  Atrial fibrillation, chronic (HCC) Heart rate controlled -Eliquis -Metoprolol  CAD (coronary artery disease) -Lipiitor  HLD (hyperlipidemia) -Lipitor  Leukocytosis Likely reactive, no evidence or symptoms of infection.  Monitor CBC  Cigarette smoker Nicotine patch  Hypocalcemia Calcium 6.1 on admission -Patient received 1 g calcium gluconate in ED Corrected calcium 7.6 today  Hyperkalemia Resolved with dialysis Monitor BMP        Subjective: Pt seen before dialysis this AM.  He says he feels fine and ready to go home.  No acute complaints. No SOB.   Physical Exam: Vitals:   09/07/22 1807 09/07/22 1825 09/07/22 1840 09/07/22 1936  BP: (!) 174/107  (!) 117/98   Pulse: 81  83   Resp:   14   Temp: 98.6 F (37 C)  98.6 F (37 C) 98.2 F (36.8 C)  TempSrc: Oral  Oral   SpO2: 92%  96%   Weight:  55 kg    Height:       General exam: awake, alert, no acute distress, underweight, chronically ill appearing HEENT: moist mucus membranes, hearing grossly normal  Respiratory system: diminished breath sounds, no wheezes, normal respiratory effort. Cardiovascular system: normal S1/S2, RRR, no pedal edema.   Gastrointestinal system: soft, NT, ND,  no HSM felt, +bowel sounds. Central nervous system: A&O x 4. no gross focal neurologic deficits, normal speech Extremities: moves all, no edema, normal tone Skin: dry, intact, normal  temperature Psychiatry: normal mood, congruent affect, judgement and insight appear normal   Data Reviewed:  Notable labs --- BUN 51, Cr 6.65, Ca 6.7, albumin 2.9 (corrected ca 7.6), WBC improved 13.0, Hbg stable 10.3  Family Communication: NOne  Disposition: Status is: Inpatient Remains inpatient appropriate because: BP uncontrolled, requiring IV meds   Planned Discharge Destination: Home    Time spent: 45 minutes  Author: Pennie Banter, DO 09/07/2022 7:48 PM  For on call review [email or web address].

## 2022-09-07 NOTE — Plan of Care (Signed)

## 2022-09-07 NOTE — Assessment & Plan Note (Signed)
 -   Continue Eliquis 

## 2022-09-07 NOTE — Assessment & Plan Note (Signed)
Heart rate controlled -Eliquis -Metoprolol

## 2022-09-07 NOTE — Assessment & Plan Note (Signed)
This is due to dialysis noncompliance and hypertensive emergency  -Nephrology consulted -s/p urgent dialysis on admission and today 7/16 -Now weaned off BiPAP & stable -Daily weights -strict I/O's -Fluid restriction -As needed bronchodilators -BP control as outlined

## 2022-09-07 NOTE — Assessment & Plan Note (Signed)
-  Lipiitor

## 2022-09-07 NOTE — Assessment & Plan Note (Signed)
Pt off nitroglycerin drip around 945 this AM. -IV hydralazine and IV labetalol PRN -Amlodipine, Isordil, metoprolol, oral hydralazine -Resume nitroglycerin drip if refractory to PRN IV meds

## 2022-09-07 NOTE — Assessment & Plan Note (Signed)
 Volume mgmt by dialysis

## 2022-09-07 NOTE — Progress Notes (Signed)
[facility]  ROUNDING NOTE   Subjective:   Anthony Hess is a 67 year old male with past medical conditions including hypertension, PVD, dyslipidemia, CAD, four-vessel CABG, and end-stage renal disease on hemodialysis. Patient presents to the emergency department with complaints of shortness of breath. Paietn has been admitted for Acute pulmonary edema (HCC) [J81.0] Acute respiratory failure with hypoxia (HCC) [J96.01] Hypertensive emergency [I16.1] Acute respiratory failure with hypoxemia (HCC) [J96.01] Chronic kidney disease with end stage renal failure on dialysis (HCC) [N18.6, Z99.2]   Patient is known to our practice and receives outpatient dialysis at [facility], followed by Dr Cherylann Ratel.   Patient seen sitting up in bed, alert and oriented States he is ready for discharge later today Remains on nitroglycerin drip Nursing at bedside, patient given all oral medications.  She will begin weaning of drip  Objective:  Vital signs in last 24 hours:  Temp:  [97.1 F (36.2 C)-98.4 F (36.9 C)] 98.1 F (36.7 C) (07/16 1130) Pulse Rate:  [88-109] 92 (07/16 0950) Resp:  [13-38] 19 (07/16 0950) BP: (109-203)/(77-130) 148/107 (07/16 1130) SpO2:  [90 %-100 %] 94 % (07/16 0950) Weight:  [55.7 kg-58.6 kg] 55.7 kg (07/16 0500)  Weight change: -0.1 kg Filed Weights   09/06/22 0939 09/06/22 1420 09/07/22 0500  Weight: 60.2 kg 58.6 kg 55.7 kg    Intake/Output: I/O last 3 completed shifts: In: 1079.8 [I.V.:1079.8] Out: 1500 [Other:1500]   Intake/Output this shift:  No intake/output data recorded.  Physical Exam: General: NAD  Head: Normocephalic, atraumatic.   Eyes: Anicteric  Lungs:  Crackles present,   Heart: Regular rate and rhythm  Abdomen:  Soft, nontender,   Extremities:  No peripheral edema.  Neurologic: Nonfocal, moving all four extremities  Skin: No lesions  Access: Rt chest permcath    Basic Metabolic Panel: Recent Labs  Lab 09/03/22 0052  09/06/22 0516 09/06/22 2220 09/07/22 0418  NA 140 139 136 136  K 4.8 5.9* 4.6 4.2  CL 100 107 96* 98  CO2 25 15* 24 25  GLUCOSE 131* 113* 130* 93  BUN 33* 85* 43* 51*  CREATININE 6.54* 10.82* 6.28* 6.65*  CALCIUM 7.6* 6.1* 7.2* 6.7*  PHOS  --   --  5.4*  --     Liver Function Tests: Recent Labs  Lab 09/03/22 0052 09/06/22 0516 09/06/22 2220 09/07/22 0418  AST 19 21  --   --   ALT 17 21  --   --   ALKPHOS 57 52  --   --   BILITOT 0.3 0.4  --   --   PROT 6.5 6.4*  --   --   ALBUMIN 3.2* 3.1* 3.2* 2.9*   Recent Labs  Lab 09/03/22 0052  LIPASE 46   No results for input(s): "AMMONIA" in the last 168 hours.  CBC: Recent Labs  Lab 09/03/22 0052 09/06/22 0516 09/06/22 2220 09/07/22 0418  WBC 8.8 14.0* 14.5* 13.0*  NEUTROABS  --  11.1*  --   --   HGB 10.7* 10.4* 10.9* 10.3*  HCT 35.6* 33.9* 34.8* 32.1*  MCV 94.4 92.4 89.5 89.7  PLT 247 346 337 348    Cardiac Enzymes: No results for input(s): "CKTOTAL", "CKMB", "CKMBINDEX", "TROPONINI" in the last 168 hours.  BNP: Invalid input(s): "POCBNP"  CBG: No results for input(s): "GLUCAP" in the last 168 hours.  Microbiology: Results for orders placed or performed during the hospital encounter of 09/06/22  SARS Coronavirus 2 by RT PCR (hospital order, performed in [facility] hospital  lab) *cepheid single result test* Anterior Nasal Swab     Status: None   Collection Time: 09/06/22  4:43 AM   Specimen: Anterior Nasal Swab  Result Value Ref Range Status   SARS Coronavirus 2 by RT PCR NEGATIVE NEGATIVE Final    Comment: (NOTE) SARS-CoV-2 target nucleic acids are NOT DETECTED.  The SARS-CoV-2 RNA is generally detectable in upper and lower respiratory specimens during the acute phase of infection. The lowest concentration of SARS-CoV-2 viral copies this assay can detect is 250 copies / mL. A negative result does not preclude SARS-CoV-2 infection and should not be used as the sole basis for treatment or  other patient management decisions.  A negative result may occur with improper specimen collection / handling, submission of specimen other than nasopharyngeal swab, presence of viral mutation(s) within the areas targeted by this assay, and inadequate number of viral copies (<250 copies / mL). A negative result must be combined with clinical observations, patient history, and epidemiological information.  Fact Sheet for Patients:   RoadLapTop.co.za  Fact Sheet for Healthcare Providers: http://kim-miller.com/  This test is not yet approved or  cleared by the [place] FDA and has been authorized for detection and/or diagnosis of SARS-CoV-2 by FDA under an Emergency Use Authorization (EUA).  This EUA will remain in effect (meaning this test can be used) for the duration of the COVID-19 declaration under Section 564(b)(1) of the Act, 21 U.S.C. section 360bbb-3(b)(1), unless the authorization is terminated or revoked sooner.  Performed at [facility], [address] [place], [place] [address]     Coagulation Studies: Recent Labs    09/06/22 0516  LABPROT 14.5  INR 1.1    Urinalysis: No results for input(s): "COLORURINE", "LABSPEC", "PHURINE", "GLUCOSEU", "HGBUR", "BILIRUBINUR", "KETONESUR", "PROTEINUR", "UROBILINOGEN", "NITRITE", "LEUKOCYTESUR" in the last 72 hours.  Invalid input(s): "APPERANCEUR"    Imaging: DG Chest Port 1 View  Result Date: 09/06/2022 CLINICAL DATA:  67 year old male with possible sepsis. Missed dialysis over the weekend. EXAM: PORTABLE CHEST 1 VIEW COMPARISON:  Chest radiograph 08/17/2022 and earlier. FINDINGS: Portable AP upright view at 0509 hours. Stable right chest dual lumen dialysis type catheter. Chronic sternotomy, CABG. Dextroconvex scoliosis. Patchy and indistinct acute left perihilar and mid lung opacity, but also evidence of Kerley B lines bilaterally. Stable mediastinal contours when  allowing for portable technique. No pneumothorax. No air bronchograms. No pleural effusion is evident. Negative visible bowel gas. No acute osseous abnormality identified. IMPRESSION: 1. Acute new indistinct left perihilar opacity, but bilateral Kerley B lines also visible. Favor asymmetric acute pulmonary edema. Superimposed left lung aspiration or infection difficult to exclude. 2. Stable right chest dialysis catheter. Chronic sternotomy, CABG. Electronically Signed   By: Odessa Fleming M.D.   On: 09/06/2022 05:29     Medications:    nitroGLYCERIN Stopped (09/07/22 0951)    amLODipine  2.5 mg Oral Daily   apixaban  2.5 mg Oral BID   atorvastatin  80 mg Oral Daily   calcitRIOL  0.5 mcg Oral Daily   calcium acetate  667 mg Oral TID WC   Chlorhexidine Gluconate Cloth  6 each Topical Q0600   hydrALAZINE  25 mg Oral TID   metoprolol succinate  25 mg Oral Daily   nicotine  21 mg Transdermal Daily   pantoprazole  40 mg Oral Daily   acetaminophen, albuterol, dextromethorphan-guaiFENesin, heparin, heparin, hydrALAZINE, ondansetron (ZOFRAN) IV  Assessment/ Plan:  [name] is a 67 y.o.  male with past medical conditions  including hypertension, PVD, dyslipidemia, CAD, four-vessel CABG, and end-stage renal disease on hemodialysis. Patient presents to the emergency department with complaints of shortness of breath. He will be admitted for Acute pulmonary edema (HCC) [J81.0] Acute respiratory failure with hypoxia (HCC) [J96.01] Hypertensive emergency [I16.1] Acute respiratory failure with hypoxemia (HCC) [J96.01] Chronic kidney disease with end stage renal failure on dialysis (HCC) [N18.6, Z99.2]   End stage renal disease on hemodialysis.  Patient received urgent dialysis yesterday, UF 1.5 L achieved.  Next treatment scheduled for later today.  2. Acute respiratory failure requiring bipap. Chest xray suspicious for infection with pulmonary edema.  Patient successfully weaned off of BiPAP  overnight.  3.Anemia of chronic kidney disease Lab Results  Component Value Date   HGB 10.3 (L) 09/07/2022    Hgb remains at goal  4. Secondary Hyperparathyroidism: with outpatient labs: None available    Lab Results  Component Value Date   PTH 170 (H) 06/25/2022   CALCIUM 6.7 (L) 09/07/2022   CAION 0.84 (LL) 09/11/2021   PHOS 5.4 (H) 09/06/2022    Serum calcium low desired level..  Corrected calcium is 7.7.  Continue calcitriol and calcium acetate with meals.   5. Hypertension with chronic kidney disease. Home regimen includes amlodipine, hydralazine, isosorbide, and metoprolol.   Current blood pressure 109/78.   LOS: 1 Artha Chiasson 7/16/202411:34 AM

## 2022-09-07 NOTE — Assessment & Plan Note (Signed)
Calcium 6.1 on admission -Patient received 1 g calcium gluconate in ED Corrected calcium 7.6 today

## 2022-09-07 NOTE — Assessment & Plan Note (Signed)
Likely reactive, no evidence or symptoms of infection.  Monitor CBC

## 2022-09-08 DIAGNOSIS — J9601 Acute respiratory failure with hypoxia: Secondary | ICD-10-CM | POA: Diagnosis not present

## 2022-09-08 LAB — RENAL FUNCTION PANEL
Albumin: 2.6 g/dL — ABNORMAL LOW (ref 3.5–5.0)
Anion gap: 10 (ref 5–15)
BUN: 36 mg/dL — ABNORMAL HIGH (ref 8–23)
CO2: 28 mmol/L (ref 22–32)
Calcium: 6.7 mg/dL — ABNORMAL LOW (ref 8.9–10.3)
Chloride: 99 mmol/L (ref 98–111)
Creatinine, Ser: 5.02 mg/dL — ABNORMAL HIGH (ref 0.61–1.24)
GFR, Estimated: 12 mL/min — ABNORMAL LOW (ref >60)
Glucose, Bld: 84 mg/dL (ref 70–99)
Phosphorus: 6 mg/dL — ABNORMAL HIGH (ref 2.5–4.6)
Potassium: 4.1 mmol/L (ref 3.5–5.1)
Sodium: 137 mmol/L (ref 135–145)

## 2022-09-08 LAB — CBC
HCT: 34.3 % — ABNORMAL LOW (ref 39.0–52.0)
Hemoglobin: 10.6 g/dL — ABNORMAL LOW (ref 13.0–17.0)
MCH: 27.8 pg (ref 26.0–34.0)
MCHC: 30.9 g/dL (ref 30.0–36.0)
MCV: 90 fL (ref 80.0–100.0)
Platelets: 320 10*3/uL (ref 150–400)
RBC: 3.81 MIL/uL — ABNORMAL LOW (ref 4.22–5.81)
RDW: 15.6 % — ABNORMAL HIGH (ref 11.5–15.5)
WBC: 9 10*3/uL (ref 4.0–10.5)
nRBC: 0 % (ref 0.0–0.2)

## 2022-09-08 NOTE — Discharge Summary (Signed)
Physician Discharge Summary   Anthony Hess  male DOB: 10-16-1955  WUJ:[id number]  PCP: Ronnald Ramp, MD  Admit date: 09/06/2022 Discharge date: 09/08/2022  Admitted From: home Disposition:  home CODE STATUS: Full code  Discharge Instructions     (HEART FAILURE PATIENTS) Call MD:  Anytime you have any of the following symptoms: 1) 3 pound weight gain in 24 hours or 5 pounds in 1 week 2) shortness of breath, with or without a dry hacking cough 3) swelling in the hands, feet or stomach 4) if you have to sleep on extra pillows at night in order to breathe.   Complete by: As directed    Call MD for:  extreme fatigue   Complete by: As directed    Call MD for:  persistant dizziness or light-headedness   Complete by: As directed    Call MD for:  severe uncontrolled pain   Complete by: As directed    Call MD for:  temperature >100.4   Complete by: As directed    Diet - low sodium heart healthy   Complete by: As directed    Discharge instructions   Complete by: As directed    Be sure to attend all your scheduled dialysis sessions to prevent re-admission to the hospital, respiratory distress and dangerously high blood pressures.  No changes to your medications.  Please follow up with Nephrology and Primary Care.   Increase activity slowly   Complete by: As directed    No wound care   Complete by: As directed       Hospital Course:  For full details, please see H&P, progress notes, consult notes and ancillary notes.  Briefly,  [name] is a 67 y.o. male with medical history significant of ESRD-HD (TTS), hypertension, PVD, CAD, CABG, diastolic CHF, atrial fibrillation on Eliquis, bladder cancer, who presented with SOB.   Patient states that he missed dialysis on Saturday, developed shortness breath which has been progressively worsening.  Patient is normally not using oxygen, but was found to have oxygen desaturation to 73% on room air, cannot speak in full  sentence, using accessory muscle for breathing, BiPAP started in ED.   In the ED blood pressure 230/130, which improved to 141/121 after started nitroglycerin drip.  Chest x-ray showed asymmetric pulmonary edema with bilateral curly B-lines.   Patient underwent urgent dialysis and respiratory status improved to baseline.  * Acute respiratory failure with hypoxia (HCC) 2/2 Fluid overload and acute pulm edema This is due to dialysis noncompliance and hypertensive emergency  -Nephrology consulted -s/p urgent dialysis on admission and 7/16 -weaned off BiPAP & stable on room air prior to discharge.   Hypertensive emergency --received nitroglycerin drip  --cont home Amlodipine, Isordil, metoprolol, oral hydralazine --Pt said he has all BP meds at home and doesn't need refills.    End-stage renal disease on hemodialysis (HCC)   (HFpEF) heart failure with preserved ejection fraction (HCC) 2D echo on 04/19/2022 showed EF 55 to 60% with grade 1 diastolic dysfunction.  Now patient has fluid overload -Volume management by dialysis   Pulmonary embolism (HCC) Continue Eliquis   Atrial fibrillation, chronic (HCC) Heart rate controlled -Eliquis -Metoprolol   CAD (coronary artery disease) -Lipiitor   HLD (hyperlipidemia) -Lipitor   Leukocytosis Likely reactive, no evidence or symptoms of infection.  WBC normalized without abx.   Cigarette smoker   Hypocalcemia Calcium 6.1 on admission -Patient received 1 g calcium gluconate in ED   Hyperkalemia Resolved with dialysis   Unless  noted above, medications under "STOP" list are ones pt was not taking PTA.  Discharge Diagnoses:  Principal Problem:   Acute respiratory failure with hypoxia (HCC) Active Problems:   Fluid overload   Hypertensive emergency   End-stage renal disease on hemodialysis (HCC)   (HFpEF) heart failure with preserved ejection fraction (HCC)   Atrial fibrillation, chronic (HCC)   Pulmonary embolism (HCC)   HLD  (hyperlipidemia)   CAD (coronary artery disease)   Leukocytosis   Cigarette smoker   Hypocalcemia   Hyperkalemia   30 Day Unplanned Readmission Risk Score    Flowsheet Row ED to Hosp-Admission (Current) from 09/06/2022 in [facility]U  30 Day Unplanned Readmission Risk Score (%) 46.39 Filed at 09/08/2022 0801       This score is the patient's risk of an unplanned readmission within 30 days of being discharged (0 -100%). The score is based on dignosis, age, lab data, medications, orders, and past utilization.   Low:  0-14.9   Medium: 15-21.9   High: 22-29.9   Extreme: 30 and above         Discharge Instructions:  Allergies as of 09/08/2022   No Known Allergies      Medication List     STOP taking these medications    acetaminophen 500 MG tablet Commonly known as: TYLENOL   multivitamin Tabs tablet   Vitamin D (Ergocalciferol) 1.25 MG (50000 UNIT) Caps capsule Commonly known as: DRISDOL       TAKE these medications    amLODipine 2.5 MG tablet Commonly known as: NORVASC Take 1 tablet (2.5 mg total) by mouth daily.   apixaban 5 MG Tabs tablet Commonly known as: ELIQUIS Take 1 tablet (5 mg total) by mouth 2 (two) times daily. What changed: Another medication with the same name was removed. Continue taking this medication, and follow the directions you see here.   Aspirin 81 MG Caps Take by mouth.   atorvastatin 80 MG tablet Commonly known as: LIPITOR Take 1 tablet (80 mg total) by mouth daily.   calcitRIOL 0.5 MCG capsule Commonly known as: ROCALTROL Take 1 capsule (0.5 mcg total) by mouth daily.   calcium acetate 667 MG capsule Commonly known as: PHOSLO Take 1 capsule (667 mg total) by mouth 3 (three) times daily with meals.   hydrALAZINE 25 MG tablet Commonly known as: APRESOLINE Take 1 tablet (25 mg total) by mouth 3 (three) times daily.   isosorbide dinitrate 10 MG tablet Commonly known as: ISORDIL Take 1 tablet (10 mg  total) by mouth 3 (three) times daily.   metoprolol succinate 25 MG 24 hr tablet Commonly known as: TOPROL-XL Take 25 mg by mouth daily.   ondansetron 4 MG disintegrating tablet Commonly known as: ZOFRAN-ODT Take 1 tablet (4 mg total) by mouth every 8 (eight) hours as needed for nausea or vomiting.   pantoprazole 40 MG tablet Commonly known as: Protonix Take 1 tablet (40 mg total) by mouth daily.   Renvela 800 MG tablet Generic drug: sevelamer carbonate Take 800 mg by mouth 3 (three) times daily with meals.   Velphoro 500 MG chewable tablet Generic drug: sucroferric oxyhydroxide Chew 1 tablet (500 mg total) by mouth 3 (three) times daily with meals.         Follow-up Information     Simmons-Robinson, Makiera, MD Follow up in 1 week(s).   Specialty: Family Medicine Contact information: [address] [place] [address] [phone]  No Known Allergies   The results of significant diagnostics from this hospitalization (including imaging, microbiology, ancillary and laboratory) are listed below for reference.   Consultations:   Procedures/Studies: DG Chest Port 1 View  Result Date: 09/06/2022 CLINICAL DATA:  67 year old male with possible sepsis. Missed dialysis over the weekend. EXAM: PORTABLE CHEST 1 VIEW COMPARISON:  Chest radiograph 08/17/2022 and earlier. FINDINGS: Portable AP upright view at 0509 hours. Stable right chest dual lumen dialysis type catheter. Chronic sternotomy, CABG. Dextroconvex scoliosis. Patchy and indistinct acute left perihilar and mid lung opacity, but also evidence of Kerley B lines bilaterally. Stable mediastinal contours when allowing for portable technique. No pneumothorax. No air bronchograms. No pleural effusion is evident. Negative visible bowel gas. No acute osseous abnormality identified. IMPRESSION: 1. Acute new indistinct left perihilar opacity, but bilateral Kerley B lines also visible. Favor  asymmetric acute pulmonary edema. Superimposed left lung aspiration or infection difficult to exclude. 2. Stable right chest dialysis catheter. Chronic sternotomy, CABG. Electronically Signed   By: Odessa Fleming M.D.   On: 09/06/2022 05:29   US Venous Img Lower Unilateral Right  Result Date: 09/03/2022 CLINICAL DATA:  67 year old male End stage renal disease patient with lower abdominal pain, left lower quadrant and right leg pain. Reportedly has a lower extremity DVT. EXAM: RIGHT LOWER EXTREMITY VENOUS DOPPLER ULTRASOUND TECHNIQUE: Gray-scale sonography with compression, as well as color and duplex ultrasound, were performed to evaluate the deep venous system(s) from the level of the common femoral vein through the popliteal and proximal calf veins. A COMPARISON:  CT Abdomen and Pelvis today reported separately. Bilateral lower extremity venous Doppler 04/20/2022. FINDINGS: Discussion via PRA with the technologist at 0523 hours confirms that the right lower extremity was imaged with Doppler ultrasound, although was inadvertently labeled "LT" throughout the series 1 images. The technologist did send corrected labels for this exam on series 2 images. VENOUS Normal compressibility of the common femoral, superficial femoral, and popliteal veins, as well as the visualized calf veins. Visualized portions of profunda femoral vein and great saphenous vein unremarkable. No filling defects to suggest DVT on grayscale or color Doppler imaging. Doppler waveforms show normal direction of venous flow, normal respiratory plasticity and response to augmentation. Limited views of the contralateral common femoral vein are unremarkable. OTHER None. Limitations: none IMPRESSION: No evidence of right lower extremity deep venous thrombosis. Electronically Signed   By: Odessa Fleming M.D.   On: 09/03/2022 05:30   CT ABDOMEN PELVIS W CONTRAST  Result Date: 09/03/2022 CLINICAL DATA:  67 year old male End stage renal disease patient with lower  abdominal pain, left lower quadrant and right leg pain. Reportedly has a lower extremity DVT. EXAM: CT ABDOMEN AND PELVIS WITH CONTRAST TECHNIQUE: Multidetector CT imaging of the abdomen and pelvis was performed using the standard protocol following bolus administration of intravenous contrast. RADIATION DOSE REDUCTION: This exam was performed according to the departmental dose-optimization program which includes automated exposure control, adjustment of the mA and/or kV according to patient size and/or use of iterative reconstruction technique. CONTRAST:  OMNIPAQUE IOHEXOL 300 MG/ML  SOLN COMPARISON:  CT Abdomen and Pelvis 08/10/2022. FINDINGS: Lower chest: Negative. Hepatobiliary: Stable liver with multiple benign hemangiomas and cysts (no follow-up imaging recommended). More decompressed gallbladder today. Pancreas: Negative. Spleen: Negative. Adrenals/Urinary Tract: Normal adrenal glands. Renal atrophy again noted. Decompressed bladder. Stomach/Bowel: Gas distended rectum. Retained stool in the upstream colon but no other dilated large or small bowel. Paucity of peritoneal fat. Decompressed stomach. No free air or  free fluid. Evidence of a normal gas containing appendix coronal images 42 through 51. No discrete bowel inflammation is identified. Vascular/Lymphatic: Severe Aortoiliac calcified atherosclerosis. Tortuous abdominal aorta, no discrete aortic aneurysm. Visible major arterial structures remain patent. There is no venous contrast for evaluation, but the portal venous system appears to be patent. No lymphadenopathy identified. Reproductive: Negative. Other: No pelvis free fluid. Musculoskeletal: Moderate thoracolumbar scoliosis. Evidence of previous right femur intramedullary rod, chronically removed. No acute osseous abnormality identified. IMPRESSION: 1. No acute or inflammatory process identified in the abdomen or pelvis. 2. Normal appendix. Large bowel retained stool. Chronic renal atrophy.  Advanced Aortic Atherosclerosis (ICD10-I70.0). Electronically Signed   By: Odessa Fleming M.D.   On: 09/03/2022 05:15   DG Chest 2 View  Result Date: 08/17/2022 CLINICAL DATA:  Chest pain, dialysis patient EXAM: CHEST - 2 VIEW COMPARISON:  08/10/2022 chest radiograph. FINDINGS: Right internal jugular dialysis catheter terminates over the right atrium. Intact sternotomy wires. Stable cardiomediastinal silhouette with normal heart size. No pneumothorax. No pleural effusion. Lungs appear clear, with no acute consolidative airspace disease and no pulmonary edema. IMPRESSION: No active cardiopulmonary disease. Electronically Signed   By: Delbert Phenix M.D.   On: 08/17/2022 09:04   CT ABDOMEN PELVIS W CONTRAST  Result Date: 08/10/2022 CLINICAL DATA:  Abdominal pain EXAM: CT ABDOMEN AND PELVIS WITH CONTRAST TECHNIQUE: Multidetector CT imaging of the abdomen and pelvis was performed using the standard protocol following bolus administration of intravenous contrast. RADIATION DOSE REDUCTION: This exam was performed according to the departmental dose-optimization program which includes automated exposure control, adjustment of the mA and/or kV according to patient size and/or use of iterative reconstruction technique. CONTRAST:  OMNIPAQUE IOHEXOL 300 MG/ML  SOLN COMPARISON:  Chest CTA dated April 19, 2022 FINDINGS: Lower chest: Left lower lobe subsegmental pulmonary embolus seen on series 2, image 4 which correlates with pulmonary embolus seen on prior chest CTA dated March 30, 2022, appears more linear when compared with the prior. No new pulmonary artery filling defects. Hepatobiliary: Interval resolution of perihepatic fluid collection. Scattered low-attenuation liver lesions are unchanged when compared with the prior exam, lesions of the hepatic dome are consistent with benign hemangiomas. No suspicious liver lesions. Gallbladder is unremarkable. No biliary ductal dilation. Pancreas: Unremarkable. No pancreatic  ductal dilatation or surrounding inflammatory changes. Spleen: Normal in size without focal abnormality. Adrenals/Urinary Tract: Bilateral adrenal glands are unremarkable. Atrophic kidneys. No hydronephrosis. Low-attenuation lesion of the left kidney which is likely simple cysts but too small to accurately characterize, no specific follow-up imaging is necessary. Bladder wall thickening. Stomach/Bowel: Stomach is within normal limits. Normal appendix. No evidence of bowel wall thickening, distention, or inflammatory changes. Vascular/Lymphatic: Severe aortic atherosclerosis. No enlarged abdominal or pelvic lymph nodes. Reproductive: Unchanged mild prostatomegaly. Other: No abdominal wall hernia or abnormality. No abdominopelvic ascites. Musculoskeletal: Levocurvature of the lumbar spine. No aggressive appearing osseous lesion. IMPRESSION: 1. Interval resolution of right perihepatic fluid collection. 2. Bladder wall thickening, correlate with urinalysis to exclude cystitis. 3. Chronic left lower lobe pulmonary embolus. 4. Aortic Atherosclerosis (ICD10-I70.0). Electronically Signed   By: Allegra Idrees Quam M.D.   On: 08/10/2022 18:06   DG Chest Portable 1 View  Result Date: 08/10/2022 CLINICAL DATA:  Shortness of breath EXAM: PORTABLE CHEST 1 VIEW COMPARISON:  X-ray 04/26/2022 FINDINGS: Hyperinflation. No consolidation, pneumothorax or effusion. No edema. Sternal wires. Curvature of the spine. Normal cardiopericardial silhouette with a tortuous aorta. Stable double-lumen large-bore right IJ catheter with tip at the SVC right  atrial junction. IMPRESSION: Postop chest with stable right IJ catheter. Hyperinflation. Electronically Signed   By: Karen Kays M.D.   On: 08/10/2022 17:18      Labs: BNP (last 3 results) Recent Labs    04/18/22 2237 09/06/22 0516  BNP 843.6* >4,500.0*   Basic Metabolic Panel: Recent Labs  Lab 09/03/22 0052 09/06/22 0516 09/06/22 2220 09/07/22 0418 09/08/22 0342  NA 140 139  136 136 137  K 4.8 5.9* 4.6 4.2 4.1  CL 100 107 96* 98 99  CO2 25 15* 24 25 28   GLUCOSE 131* 113* 130* 93 84  BUN 33* 85* 43* 51* 36*  CREATININE 6.54* 10.82* 6.28* 6.65* 5.02*  CALCIUM 7.6* 6.1* 7.2* 6.7* 6.7*  PHOS  --   --  5.4*  --  6.0*   Liver Function Tests: Recent Labs  Lab 09/03/22 0052 09/06/22 0516 09/06/22 2220 09/07/22 0418 09/08/22 0342  AST 19 21  --   --   --   ALT 17 21  --   --   --   ALKPHOS 57 52  --   --   --   BILITOT 0.3 0.4  --   --   --   PROT 6.5 6.4*  --   --   --   ALBUMIN 3.2* 3.1* 3.2* 2.9* 2.6*   Recent Labs  Lab 09/03/22 0052  LIPASE 46   No results for input(s): "AMMONIA" in the last 168 hours. CBC: Recent Labs  Lab 09/03/22 0052 09/06/22 0516 09/06/22 2220 09/07/22 0418 09/08/22 0342  WBC 8.8 14.0* 14.5* 13.0* 9.0  NEUTROABS  --  11.1*  --   --   --   HGB 10.7* 10.4* 10.9* 10.3* 10.6*  HCT 35.6* 33.9* 34.8* 32.1* 34.3*  MCV 94.4 92.4 89.5 89.7 90.0  PLT 247 346 337 348 320   Cardiac Enzymes: No results for input(s): "CKTOTAL", "CKMB", "CKMBINDEX", "TROPONINI" in the last 168 hours. BNP: Invalid input(s): "POCBNP" CBG: No results for input(s): "GLUCAP" in the last 168 hours. D-Dimer No results for input(s): "DDIMER" in the last 72 hours. Hgb A1c No results for input(s): "HGBA1C" in the last 72 hours. Lipid Profile No results for input(s): "CHOL", "HDL", "LDLCALC", "TRIG", "CHOLHDL", "LDLDIRECT" in the last 72 hours. Thyroid function studies No results for input(s): "TSH", "T4TOTAL", "T3FREE", "THYROIDAB" in the last 72 hours.  Invalid input(s): "FREET3" Anemia work up No results for input(s): "VITAMINB12", "FOLATE", "FERRITIN", "TIBC", "IRON", "RETICCTPCT" in the last 72 hours. Urinalysis    Component Value Date/Time   COLORURINE YELLOW (A) 08/10/2022 1634   APPEARANCEUR TURBID (A) 08/10/2022 1634   APPEARANCEUR Cloudy (A) 05/08/2019 1448   LABSPEC 1.017 08/10/2022 1634   PHURINE 7.0 08/10/2022 1634   GLUCOSEU  NEGATIVE 08/10/2022 1634   HGBUR SMALL (A) 08/10/2022 1634   BILIRUBINUR NEGATIVE 08/10/2022 1634   BILIRUBINUR Negative 05/08/2019 1448   KETONESUR NEGATIVE 08/10/2022 1634   PROTEINUR >=300 (A) 08/10/2022 1634   NITRITE NEGATIVE 08/10/2022 1634   LEUKOCYTESUR MODERATE (A) 08/10/2022 1634   Sepsis Labs Recent Labs  Lab 09/06/22 0516 09/06/22 2220 09/07/22 0418 09/08/22 0342  WBC 14.0* 14.5* 13.0* 9.0   Microbiology Recent Results (from the past 240 hour(s))  SARS Coronavirus 2 by RT PCR (hospital order, performed in [facility] hospital lab) *cepheid single result test* Anterior Nasal Swab     Status: None   Collection Time: 09/06/22  4:43 AM   Specimen: Anterior Nasal Swab  Result Value Ref Range Status  SARS Coronavirus 2 by RT PCR NEGATIVE NEGATIVE Final    Comment: (NOTE) SARS-CoV-2 target nucleic acids are NOT DETECTED.  The SARS-CoV-2 RNA is generally detectable in upper and lower respiratory specimens during the acute phase of infection. The lowest concentration of SARS-CoV-2 viral copies this assay can detect is 250 copies / mL. A negative result does not preclude SARS-CoV-2 infection and should not be used as the sole basis for treatment or other patient management decisions.  A negative result may occur with improper specimen collection / handling, submission of specimen other than nasopharyngeal swab, presence of viral mutation(s) within the areas targeted by this assay, and inadequate number of viral copies (<250 copies / mL). A negative result must be combined with clinical observations, patient history, and epidemiological information.  Fact Sheet for Patients:   RoadLapTop.co.za  Fact Sheet for Healthcare Providers: http://kim-miller.com/  This test is not yet approved or  cleared by the [place] FDA and has been authorized for detection and/or diagnosis of SARS-CoV-2 by FDA under an Emergency Use  Authorization (EUA).  This EUA will remain in effect (meaning this test can be used) for the duration of the COVID-19 declaration under Section 564(b)(1) of the Act, 21 U.S.C. section 360bbb-3(b)(1), unless the authorization is terminated or revoked sooner.  Performed at [facility], [address] [place], [place] [address]      Total time spend on discharging this patient, including the last patient exam, discussing the hospital stay, instructions for ongoing care as it relates to all pertinent caregivers, as well as preparing the medical discharge records, prescriptions, and/or referrals as applicable, is 40 minutes.    Darlin Priestly, MD  [facility] 09/08/2022, 9:00 AM

## 2022-09-08 NOTE — Plan of Care (Signed)

## 2022-09-08 NOTE — Progress Notes (Signed)
[facility]  ROUNDING NOTE   Subjective:   Anthony Hess is a 67 year old male with past medical conditions including hypertension, PVD, dyslipidemia, CAD, four-vessel CABG, and end-stage renal disease on hemodialysis. Patient presents to the emergency department with complaints of shortness of breath. Paietn has been admitted for Acute pulmonary edema (HCC) [J81.0] Acute respiratory failure with hypoxia (HCC) [J96.01] Hypertensive emergency [I16.1] Acute respiratory failure with hypoxemia (HCC) [J96.01] Chronic kidney disease with end stage renal failure on dialysis (HCC) [N18.6, Z99.2]   Patient is known to our practice and receives outpatient dialysis at [facility], followed by Dr Cherylann Ratel.   Patient sitting up in bed Alert and oriented States he will be discharged today  Objective:  Vital signs in last 24 hours:  Temp:  [97.7 F (36.5 C)-98.6 F (37 C)] 97.7 F (36.5 C) (07/17 0759) Pulse Rate:  [41-90] 78 (07/17 0356) Resp:  [14-24] 14 (07/17 0600) BP: (82-196)/(61-136) 114/73 (07/17 0600) SpO2:  [92 %-100 %] 95 % (07/17 0356) Weight:  [55 kg-57.4 kg] 57.4 kg (07/17 0358)  Weight change: -3.6 kg Filed Weights   09/07/22 1453 09/07/22 1825 09/08/22 0358  Weight: 56.6 kg 55 kg 57.4 kg    Intake/Output: I/O last 3 completed shifts: In: 1079.8 [I.V.:1079.8] Out: 1501 [Urine:1; Other:1500]   Intake/Output this shift:  No intake/output data recorded.  Physical Exam: General: NAD  Head: Normocephalic, atraumatic.   Eyes: Anicteric  Lungs:  Crackles present  Heart: Regular rate and rhythm  Abdomen:  Soft, nontender  Extremities:  No peripheral edema.  Neurologic: Alert and oriented, moving all four extremities  Skin: No lesions  Access: Rt chest permcath    Basic Metabolic Panel: Recent Labs  Lab 09/03/22 0052 09/06/22 0516 09/06/22 2220 09/07/22 0418 09/08/22 0342  NA 140 139 136 136 137  K 4.8 5.9* 4.6 4.2 4.1  CL 100 107 96* 98 99  CO2 25  15* 24 25 28   GLUCOSE 131* 113* 130* 93 84  BUN 33* 85* 43* 51* 36*  CREATININE 6.54* 10.82* 6.28* 6.65* 5.02*  CALCIUM 7.6* 6.1* 7.2* 6.7* 6.7*  PHOS  --   --  5.4*  --  6.0*    Liver Function Tests: Recent Labs  Lab 09/03/22 0052 09/06/22 0516 09/06/22 2220 09/07/22 0418 09/08/22 0342  AST 19 21  --   --   --   ALT 17 21  --   --   --   ALKPHOS 57 52  --   --   --   BILITOT 0.3 0.4  --   --   --   PROT 6.5 6.4*  --   --   --   ALBUMIN 3.2* 3.1* 3.2* 2.9* 2.6*   Recent Labs  Lab 09/03/22 0052  LIPASE 46   No results for input(s): "AMMONIA" in the last 168 hours.  CBC: Recent Labs  Lab 09/03/22 0052 09/06/22 0516 09/06/22 2220 09/07/22 0418 09/08/22 0342  WBC 8.8 14.0* 14.5* 13.0* 9.0  NEUTROABS  --  11.1*  --   --   --   HGB 10.7* 10.4* 10.9* 10.3* 10.6*  HCT 35.6* 33.9* 34.8* 32.1* 34.3*  MCV 94.4 92.4 89.5 89.7 90.0  PLT 247 346 337 348 320    Cardiac Enzymes: No results for input(s): "CKTOTAL", "CKMB", "CKMBINDEX", "TROPONINI" in the last 168 hours.  BNP: Invalid input(s): "POCBNP"  CBG: No results for input(s): "GLUCAP" in the last 168 hours.  Microbiology: Results for orders placed or performed during the  hospital encounter of 09/06/22  SARS Coronavirus 2 by RT PCR (hospital order, performed in [facility] hospital lab) *cepheid single result test* Anterior Nasal Swab     Status: None   Collection Time: 09/06/22  4:43 AM   Specimen: Anterior Nasal Swab  Result Value Ref Range Status   SARS Coronavirus 2 by RT PCR NEGATIVE NEGATIVE Final    Comment: (NOTE) SARS-CoV-2 target nucleic acids are NOT DETECTED.  The SARS-CoV-2 RNA is generally detectable in upper and lower respiratory specimens during the acute phase of infection. The lowest concentration of SARS-CoV-2 viral copies this assay can detect is 250 copies / mL. A negative result does not preclude SARS-CoV-2 infection and should not be used as the sole basis for treatment or  other patient management decisions.  A negative result may occur with improper specimen collection / handling, submission of specimen other than nasopharyngeal swab, presence of viral mutation(s) within the areas targeted by this assay, and inadequate number of viral copies (<250 copies / mL). A negative result must be combined with clinical observations, patient history, and epidemiological information.  Fact Sheet for Patients:   RoadLapTop.co.za  Fact Sheet for Healthcare Providers: http://kim-miller.com/  This test is not yet approved or  cleared by the [place] FDA and has been authorized for detection and/or diagnosis of SARS-CoV-2 by FDA under an Emergency Use Authorization (EUA).  This EUA will remain in effect (meaning this test can be used) for the duration of the COVID-19 declaration under Section 564(b)(1) of the Act, 21 U.S.C. section 360bbb-3(b)(1), unless the authorization is terminated or revoked sooner.  Performed at [facility], [address] [place], [place] [address]     Coagulation Studies: Recent Labs    09/06/22 0516  LABPROT 14.5  INR 1.1    Urinalysis: No results for input(s): "COLORURINE", "LABSPEC", "PHURINE", "GLUCOSEU", "HGBUR", "BILIRUBINUR", "KETONESUR", "PROTEINUR", "UROBILINOGEN", "NITRITE", "LEUKOCYTESUR" in the last 72 hours.  Invalid input(s): "APPERANCEUR"    Imaging: No results found.   Medications:      amLODipine  2.5 mg Oral Daily   apixaban  2.5 mg Oral BID   atorvastatin  80 mg Oral Daily   calcitRIOL  0.5 mcg Oral Daily   calcium acetate  667 mg Oral TID WC   Chlorhexidine Gluconate Cloth  6 each Topical Q0600   hydrALAZINE  25 mg Oral TID   metoprolol succinate  25 mg Oral Daily   nicotine  21 mg Transdermal Daily   pantoprazole  40 mg Oral Daily   acetaminophen, albuterol, dextromethorphan-guaiFENesin, hydrALAZINE, labetalol, ondansetron (ZOFRAN)  IV  Assessment/ Plan:  [name] is a 67 y.o.  male with past medical conditions including hypertension, PVD, dyslipidemia, CAD, four-vessel CABG, and end-stage renal disease on hemodialysis. Patient presents to the emergency department with complaints of shortness of breath. He will be admitted for Acute pulmonary edema (HCC) [J81.0] Acute respiratory failure with hypoxia (HCC) [J96.01] Hypertensive emergency [I16.1] Acute respiratory failure with hypoxemia (HCC) [J96.01] Chronic kidney disease with end stage renal failure on dialysis (HCC) [N18.6, Z99.2]   End stage renal disease on hemodialysis.  Dialysis received yesterday, UF 1.5L achieved. Next treatment scheduled for Thursday  2. Acute respiratory failure requiring bipap. Chest xray suspicious for infection with pulmonary edema.  Weaned to room air, no complaints of shortness of breath  3.Anemia of chronic kidney disease Lab Results  Component Value Date   HGB 10.6 (L) 09/08/2022    Hgb within desired range.   4.  Secondary Hyperparathyroidism: with outpatient labs: None available    Lab Results  Component Value Date   PTH 170 (H) 06/25/2022   CALCIUM 6.7 (L) 09/08/2022   CAION 0.84 (LL) 09/11/2021   PHOS 6.0 (H) 09/08/2022    Serum calcium remains 6.7.  Corrected calcium is 7.7.  Continue calcitriol and calcium acetate with meals.   5. Hypertension with chronic kidney disease. Home regimen includes amlodipine, hydralazine, isosorbide, and metoprolol.   Blood pressure 120/94.    LOS: 2 Donaldson Richter 7/17/202412:28 PM

## 2022-09-09 ENCOUNTER — Telehealth: Payer: Self-pay

## 2022-09-09 NOTE — Transitions of Care (Post Inpatient/ED Visit) (Unsigned)
   09/09/2022  Name: Eljay Lave MRN: [id number] DOB: 12-29-1955  Today's TOC FU Call Status: Unsuccessful Call (1st Attempt) Date: 09/09/22  Attempted to reach the patient regarding the most recent Inpatient/ED visit.  Follow Up Plan: Additional outreach attempts will be made to reach the patient to complete the Transitions of Care (Post Inpatient/ED visit) call.   Signature Karena Addison, LPN [facility] Nurse Health Advisor Direct Dial [phone]

## 2022-09-13 NOTE — Transitions of Care (Post Inpatient/ED Visit) (Unsigned)
   09/13/2022  Name: Anthony Hess MRN: [id number] DOB: Jan 14, 1956  Today's TOC FU Call Status: Today's TOC FU Call Status:: Unsuccessful Call (2nd Attempt) Unsuccessful Call (1st Attempt) Date: 09/09/22 Unsuccessful Call (2nd Attempt) Date: 09/13/22  Attempted to reach the patient regarding the most recent Inpatient/ED visit.  Follow Up Plan: Additional outreach attempts will be made to reach the patient to complete the Transitions of Care (Post Inpatient/ED visit) call.   Signature Karena Addison, LPN [facility] Nurse Health Advisor Direct Dial [phone]

## 2022-09-14 ENCOUNTER — Emergency Department: Payer: Medicare Other

## 2022-09-14 ENCOUNTER — Inpatient Hospital Stay: Payer: Medicare Other

## 2022-09-14 ENCOUNTER — Other Ambulatory Visit: Payer: Self-pay

## 2022-09-14 ENCOUNTER — Observation Stay
Admission: EM | Admit: 2022-09-14 | Discharge: 2022-09-15 | Disposition: A | Discharge status: Home / Self Care | Payer: Medicare Other | Attending: Internal Medicine | Admitting: Internal Medicine

## 2022-09-14 DIAGNOSIS — R1011 Right upper quadrant pain: Secondary | ICD-10-CM | POA: Diagnosis not present

## 2022-09-14 DIAGNOSIS — R109 Unspecified abdominal pain: Secondary | ICD-10-CM | POA: Diagnosis not present

## 2022-09-14 DIAGNOSIS — I5020 Unspecified systolic (congestive) heart failure: Secondary | ICD-10-CM

## 2022-09-14 DIAGNOSIS — D631 Anemia in chronic kidney disease: Secondary | ICD-10-CM | POA: Diagnosis present

## 2022-09-14 DIAGNOSIS — I2510 Atherosclerotic heart disease of native coronary artery without angina pectoris: Secondary | ICD-10-CM | POA: Insufficient documentation

## 2022-09-14 DIAGNOSIS — I5030 Unspecified diastolic (congestive) heart failure: Secondary | ICD-10-CM | POA: Diagnosis present

## 2022-09-14 DIAGNOSIS — E785 Hyperlipidemia, unspecified: Secondary | ICD-10-CM | POA: Diagnosis present

## 2022-09-14 DIAGNOSIS — Z992 Dependence on renal dialysis: Secondary | ICD-10-CM | POA: Diagnosis not present

## 2022-09-14 DIAGNOSIS — N186 End stage renal disease: Secondary | ICD-10-CM

## 2022-09-14 DIAGNOSIS — R0602 Shortness of breath: Secondary | ICD-10-CM

## 2022-09-14 DIAGNOSIS — I214 Non-ST elevation (NSTEMI) myocardial infarction: Secondary | ICD-10-CM | POA: Diagnosis not present

## 2022-09-14 DIAGNOSIS — Z7901 Long term (current) use of anticoagulants: Secondary | ICD-10-CM | POA: Insufficient documentation

## 2022-09-14 DIAGNOSIS — E875 Hyperkalemia: Secondary | ICD-10-CM | POA: Diagnosis not present

## 2022-09-14 DIAGNOSIS — I739 Peripheral vascular disease, unspecified: Secondary | ICD-10-CM

## 2022-09-14 DIAGNOSIS — I25810 Atherosclerosis of coronary artery bypass graft(s) without angina pectoris: Secondary | ICD-10-CM | POA: Diagnosis present

## 2022-09-14 DIAGNOSIS — Z1152 Encounter for screening for COVID-19: Secondary | ICD-10-CM | POA: Insufficient documentation

## 2022-09-14 DIAGNOSIS — D72829 Elevated white blood cell count, unspecified: Secondary | ICD-10-CM | POA: Diagnosis present

## 2022-09-14 DIAGNOSIS — F17210 Nicotine dependence, cigarettes, uncomplicated: Secondary | ICD-10-CM | POA: Diagnosis present

## 2022-09-14 DIAGNOSIS — I4891 Unspecified atrial fibrillation: Secondary | ICD-10-CM | POA: Insufficient documentation

## 2022-09-14 DIAGNOSIS — I2699 Other pulmonary embolism without acute cor pulmonale: Secondary | ICD-10-CM | POA: Diagnosis present

## 2022-09-14 DIAGNOSIS — I482 Chronic atrial fibrillation, unspecified: Secondary | ICD-10-CM | POA: Diagnosis present

## 2022-09-14 LAB — CBC WITH DIFFERENTIAL/PLATELET
Abs Immature Granulocytes: 0.08 10*3/uL — ABNORMAL HIGH (ref 0.00–0.07)
Basophils Absolute: 0.1 10*3/uL (ref 0.0–0.1)
Basophils Relative: 0 %
Eosinophils Absolute: 0.5 10*3/uL (ref 0.0–0.5)
Eosinophils Relative: 4 %
HCT: 34.5 % — ABNORMAL LOW (ref 39.0–52.0)
Hemoglobin: 10.7 g/dL — ABNORMAL LOW (ref 13.0–17.0)
Immature Granulocytes: 1 %
Lymphocytes Relative: 8 %
Lymphs Abs: 1.3 10*3/uL (ref 0.7–4.0)
MCH: 28.2 pg (ref 26.0–34.0)
MCHC: 31 g/dL (ref 30.0–36.0)
MCV: 90.8 fL (ref 80.0–100.0)
Monocytes Absolute: 1.2 10*3/uL — ABNORMAL HIGH (ref 0.1–1.0)
Monocytes Relative: 8 %
Neutro Abs: 12.2 10*3/uL — ABNORMAL HIGH (ref 1.7–7.7)
Neutrophils Relative %: 79 %
Platelets: 335 10*3/uL (ref 150–400)
RBC: 3.8 MIL/uL — ABNORMAL LOW (ref 4.22–5.81)
RDW: 16 % — ABNORMAL HIGH (ref 11.5–15.5)
WBC: 15.3 10*3/uL — ABNORMAL HIGH (ref 4.0–10.5)
nRBC: 0 % (ref 0.0–0.2)

## 2022-09-14 LAB — BASIC METABOLIC PANEL
Anion gap: 15 (ref 5–15)
BUN: 84 mg/dL — ABNORMAL HIGH (ref 8–23)
CO2: 18 mmol/L — ABNORMAL LOW (ref 22–32)
Calcium: 6.5 mg/dL — ABNORMAL LOW (ref 8.9–10.3)
Chloride: 106 mmol/L (ref 98–111)
Creatinine, Ser: 8.73 mg/dL — ABNORMAL HIGH (ref 0.61–1.24)
GFR, Estimated: 6 mL/min — ABNORMAL LOW (ref >60)
Glucose, Bld: 62 mg/dL — ABNORMAL LOW (ref 70–99)
Potassium: 4.5 mmol/L (ref 3.5–5.1)
Sodium: 139 mmol/L (ref 135–145)

## 2022-09-14 LAB — POTASSIUM
Potassium: 3.3 mmol/L — ABNORMAL LOW (ref 3.5–5.1)
Potassium: 4.1 mmol/L (ref 3.5–5.1)

## 2022-09-14 LAB — PROTIME-INR
INR: 1.1 (ref 0.8–1.2)
Prothrombin Time: 14.4 seconds (ref 11.4–15.2)

## 2022-09-14 LAB — COMPREHENSIVE METABOLIC PANEL
ALT: 34 U/L (ref 0–44)
AST: 28 U/L (ref 15–41)
Albumin: 3.6 g/dL (ref 3.5–5.0)
Alkaline Phosphatase: 59 U/L (ref 38–126)
Anion gap: 14 (ref 5–15)
BUN: 83 mg/dL — ABNORMAL HIGH (ref 8–23)
CO2: 18 mmol/L — ABNORMAL LOW (ref 22–32)
Calcium: 6.6 mg/dL — ABNORMAL LOW (ref 8.9–10.3)
Chloride: 107 mmol/L (ref 98–111)
Creatinine, Ser: 8.83 mg/dL — ABNORMAL HIGH (ref 0.61–1.24)
GFR, Estimated: 6 mL/min — ABNORMAL LOW (ref >60)
Glucose, Bld: 96 mg/dL (ref 70–99)
Potassium: 6 mmol/L — ABNORMAL HIGH (ref 3.5–5.1)
Sodium: 139 mmol/L (ref 135–145)
Total Bilirubin: 0.3 mg/dL (ref 0.3–1.2)
Total Protein: 7 g/dL (ref 6.5–8.1)

## 2022-09-14 LAB — HEPATITIS B SURFACE ANTIGEN: Hepatitis B Surface Ag: NONREACTIVE

## 2022-09-14 LAB — SARS CORONAVIRUS 2 BY RT PCR: SARS Coronavirus 2 by RT PCR: NEGATIVE

## 2022-09-14 LAB — CBG MONITORING, ED: Glucose-Capillary: 155 mg/dL — ABNORMAL HIGH (ref 70–99)

## 2022-09-14 LAB — TROPONIN I (HIGH SENSITIVITY)
Troponin I (High Sensitivity): 380 ng/L (ref <18)
Troponin I (High Sensitivity): 350 ng/L (ref <18)
Troponin I (High Sensitivity): 437 ng/L (ref <18)
Troponin I (High Sensitivity): 581 ng/L (ref <18)

## 2022-09-14 LAB — HEPARIN LEVEL (UNFRACTIONATED)
Heparin Unfractionated: <0.1 IU/mL — ABNORMAL LOW (ref 0.30–0.70)
Heparin Unfractionated: 0.17 IU/mL — ABNORMAL LOW (ref 0.30–0.70)

## 2022-09-14 LAB — APTT: aPTT: 31 seconds (ref 24–36)

## 2022-09-14 MED ORDER — LIDOCAINE HCL (PF) 1 % IJ SOLN: 5.0000 mL | INTRAMUSCULAR | Status: DC | PRN

## 2022-09-14 MED ORDER — HEPARIN (PORCINE) 25000 UT/250ML-% IV SOLN
950.0000 [IU]/h | INTRAVENOUS | Status: DC
  Administered 2022-09-14: 750 [IU]/h via INTRAVENOUS

## 2022-09-14 MED ORDER — ALBUTEROL SULFATE (2.5 MG/3ML) 0.083% IN NEBU
10.0000 mg | INHALATION_SOLUTION | Freq: Once | RESPIRATORY_TRACT | Status: AC
  Administered 2022-09-14: 10 mg via RESPIRATORY_TRACT
  Filled 2022-09-14: qty 12

## 2022-09-14 MED ORDER — DEXTROSE 50 % IV SOLN
1.0000 | Freq: Once | INTRAVENOUS | Status: AC
  Administered 2022-09-14: 50 mL via INTRAVENOUS
  Filled 2022-09-14: qty 50

## 2022-09-14 MED ORDER — HYDRALAZINE HCL 20 MG/ML IJ SOLN
10.0000 mg | INTRAMUSCULAR | Status: DC | PRN
  Administered 2022-09-14: 10 mg via INTRAVENOUS
  Filled 2022-09-14: qty 1

## 2022-09-14 MED ORDER — FUROSEMIDE 10 MG/ML IJ SOLN
60.0000 mg | Freq: Once | INTRAMUSCULAR | Status: AC
  Administered 2022-09-14: 60 mg via INTRAVENOUS
  Filled 2022-09-14: qty 8

## 2022-09-14 MED ORDER — CHLORHEXIDINE GLUCONATE CLOTH 2 % EX PADS
6.0000 | MEDICATED_PAD | Freq: Every day | CUTANEOUS | Status: DC
  Filled 2022-09-14 (×2): qty 6

## 2022-09-14 MED ORDER — ACETAMINOPHEN 325 MG PO TABS: 650.0000 mg | ORAL_TABLET | ORAL | Status: DC | PRN

## 2022-09-14 MED ORDER — PANTOPRAZOLE SODIUM 40 MG IV SOLR
40.0000 mg | Freq: Two times a day (BID) | INTRAVENOUS | Status: DC
  Administered 2022-09-14 – 2022-09-15 (×3): 40 mg via INTRAVENOUS
  Filled 2022-09-14 (×3): qty 10

## 2022-09-14 MED ORDER — HEPARIN (PORCINE) 25000 UT/250ML-% IV SOLN
INTRAVENOUS | Status: AC
  Filled 2022-09-14: qty 250

## 2022-09-14 MED ORDER — APIXABAN 5 MG PO TABS
5.0000 mg | ORAL_TABLET | Freq: Two times a day (BID) | ORAL | Status: DC
  Administered 2022-09-14 – 2022-09-15 (×2): 5 mg via ORAL
  Filled 2022-09-14 (×2): qty 1

## 2022-09-14 MED ORDER — ASPIRIN 81 MG PO CHEW
81.0000 mg | CHEWABLE_TABLET | Freq: Every day | ORAL | Status: DC
  Administered 2022-09-15: 81 mg via ORAL
  Filled 2022-09-14: qty 1

## 2022-09-14 MED ORDER — LIDOCAINE-PRILOCAINE 2.5-2.5 % EX CREA: 1.0000 | TOPICAL_CREAM | CUTANEOUS | Status: DC | PRN

## 2022-09-14 MED ORDER — ALTEPLASE 2 MG IJ SOLR: 2.0000 mg | Freq: Once | INTRAMUSCULAR | Status: DC | PRN

## 2022-09-14 MED ORDER — NITROGLYCERIN 2 % TD OINT
1.0000 [in_us] | TOPICAL_OINTMENT | Freq: Once | TRANSDERMAL | Status: AC
  Administered 2022-09-14: 1 [in_us] via TOPICAL
  Filled 2022-09-14: qty 1

## 2022-09-14 MED ORDER — NICOTINE 14 MG/24HR TD PT24
14.0000 mg | MEDICATED_PATCH | Freq: Every day | TRANSDERMAL | Status: DC
  Administered 2022-09-14 – 2022-09-15 (×2): 14 mg via TRANSDERMAL
  Filled 2022-09-14 (×2): qty 1

## 2022-09-14 MED ORDER — HEPARIN SOD (PORK) LOCK FLUSH 100 UNIT/ML IV SOLN: 500.0000 [IU] | Freq: Once | INTRAVENOUS | Status: DC

## 2022-09-14 MED ORDER — PENTAFLUOROPROP-TETRAFLUOROETH EX AERO: 1.0000 | INHALATION_SPRAY | CUTANEOUS | Status: DC | PRN

## 2022-09-14 MED ORDER — HEPARIN BOLUS VIA INFUSION
3400.0000 [IU] | Freq: Once | INTRAVENOUS | Status: AC
  Administered 2022-09-14: 3400 [IU] via INTRAVENOUS
  Filled 2022-09-14: qty 3400

## 2022-09-14 MED ORDER — HEPARIN BOLUS VIA INFUSION
1750.0000 [IU] | Freq: Once | INTRAVENOUS | Status: DC
  Filled 2022-09-14: qty 1750

## 2022-09-14 MED ORDER — INSULIN ASPART 100 UNIT/ML IV SOLN
10.0000 [IU] | Freq: Once | INTRAVENOUS | Status: AC
  Administered 2022-09-14: 10 [IU] via INTRAVENOUS
  Filled 2022-09-14: qty 0.1

## 2022-09-14 MED ORDER — ANTICOAGULANT SODIUM CITRATE 4% (200MG/5ML) IV SOLN: 5.0000 mL | Status: DC | PRN

## 2022-09-14 MED ORDER — INSULIN ASPART 100 UNIT/ML IV SOLN
5.0000 [IU] | Freq: Once | INTRAVENOUS | Status: AC
  Administered 2022-09-14: 5 [IU] via INTRAVENOUS
  Filled 2022-09-14: qty 0.05

## 2022-09-14 MED ORDER — ASPIRIN 81 MG PO CHEW
324.0000 mg | CHEWABLE_TABLET | Freq: Once | ORAL | Status: AC
  Administered 2022-09-14: 324 mg via ORAL
  Filled 2022-09-14: qty 4

## 2022-09-14 MED ORDER — METOPROLOL SUCCINATE ER 50 MG PO TB24
25.0000 mg | ORAL_TABLET | Freq: Every day | ORAL | Status: DC
  Administered 2022-09-14 – 2022-09-15 (×2): 25 mg via ORAL
  Filled 2022-09-14 (×2): qty 1

## 2022-09-14 MED ORDER — ONDANSETRON HCL 4 MG/2ML IJ SOLN: 4.0000 mg | Freq: Four times a day (QID) | INTRAMUSCULAR | Status: DC | PRN

## 2022-09-14 MED ORDER — HEPARIN SODIUM (PORCINE) 5000 UNIT/ML IJ SOLN: 60.0000 [IU]/kg | Freq: Once | INTRAMUSCULAR | Status: DC

## 2022-09-14 MED ORDER — HEPARIN SODIUM (PORCINE) 1000 UNIT/ML DIALYSIS: 1000.0000 [IU] | INTRAMUSCULAR | Status: DC | PRN

## 2022-09-14 MED ORDER — SODIUM ZIRCONIUM CYCLOSILICATE 10 G PO PACK
10.0000 g | PACK | Freq: Once | ORAL | Status: AC
  Administered 2022-09-14: 10 g via ORAL
  Filled 2022-09-14: qty 1

## 2022-09-14 NOTE — ED Provider Notes (Signed)
[facility] Provider Note    Event Date/Time   First MD Initiated Contact with Patient 09/14/22 [phone]     (approximate)   History   Shortness of Breath (Pt BIB [facility] from home with c/o SOB that started x 1 hr ago. Dialysis patient T, Th, Friday. Denies Chest pain. Patient received duoneb from EMS. )   HPI  Anthony Hess is a 67 y.o. male who presents to the ED for evaluation of Shortness of Breath (Pt BIB [facility] from home with c/o SOB that started x 1 hr ago. Dialysis patient T, Th, Friday. Denies Chest pain. Patient received duoneb from EMS. )   I reviewed medical DC summary from 6 days ago.  Admitted for 2 days due to hypoxic respiratory failure, pulmonary edema and volume overload.  He is an ESRD patient with TThS iHD.  PAD, CAD, A-fib on Eliquis.  Patient reports compliance with his dialysis regimen, finishing this past Saturday and scheduled to be dialyzed later this morning.  He presents to the ED short of breath.  Symptoms started overnight tonight in the past couple hours.  No fevers, chest pain or recent illnesses.  EMS reports finding him with sats in the 70s-80s on room air, placed on NRB   Physical Exam   Triage Vital Signs: ED Triage Vitals [09/14/22 0348]  Encounter Vitals Group     BP      Systolic BP Percentile      Diastolic BP Percentile      Pulse      Resp      Temp      Temp src      SpO2 100 %     Weight      Height      Head Circumference      Peak Flow      Pain Score      Pain Loc      Pain Education      Exclude from Growth Chart     Most recent vital signs: Vitals:   09/14/22 0349 09/14/22 0400  BP: (!) 203/130 (!) 176/128  Pulse: 91 96  Resp: 20 (!) 29  Temp: 98.6 F (37 C)   SpO2: 100% 94%    General: Awake, no distress.  Sitting upright, tachypneic CV:  Good peripheral perfusion.  Resp:  Tachypnea to the mid 20s no wheezing Abd:  No distention.  MSK:  No deformity noted.  Neuro:  No  focal deficits appreciated. Other:     ED Results / Procedures / Treatments   Labs (all labs ordered are listed, but only abnormal results are displayed) Labs Reviewed  COMPREHENSIVE METABOLIC PANEL - Abnormal; Notable for the following components:      Result Value   Potassium 6.0 (*)    CO2 18 (*)    BUN 83 (*)    Creatinine, Ser 8.83 (*)    Calcium 6.6 (*)    GFR, Estimated 6 (*)    All other components within normal limits  CBC WITH DIFFERENTIAL/PLATELET - Abnormal; Notable for the following components:   WBC 15.3 (*)    RBC 3.80 (*)    Hemoglobin 10.7 (*)    HCT 34.5 (*)    RDW 16.0 (*)    Neutro Abs 12.2 (*)    Monocytes Absolute 1.2 (*)    Abs Immature Granulocytes 0.08 (*)    All other components within normal limits  TROPONIN I (HIGH SENSITIVITY) - Abnormal;  Notable for the following components:   Troponin I (High Sensitivity) 380 (*)    All other components within normal limits  SARS CORONAVIRUS 2 BY RT PCR  PROTIME-INR  APTT  HEPARIN LEVEL (UNFRACTIONATED)  TROPONIN I (HIGH SENSITIVITY)    EKG Sinus rhythm with a rate of 90 bpm.  Normal axis.  Nonspecific ST changes.  Prominent T waves are noted.  RADIOLOGY CXR interpreted by me without evidence of acute cardiopulmonary pathology. CT abdomen/pelvis interpreted by me with gallstones but no other evidence of acute intra-abdominal pathology  Official radiology report(s): CT ABDOMEN PELVIS WO CONTRAST  Result Date: 09/14/2022 CLINICAL DATA:  67 year old male dialysis patient with shortness of breath. Periumbilical abdominal pain. EXAM: CT ABDOMEN AND PELVIS WITHOUT CONTRAST TECHNIQUE: Multidetector CT imaging of the abdomen and pelvis was performed following the standard protocol without IV contrast. RADIATION DOSE REDUCTION: This exam was performed according to the departmental dose-optimization program which includes automated exposure control, adjustment of the mA and/or kV according to patient size and/or  use of iterative reconstruction technique. COMPARISON:  Portable chest x-ray 0404 hours today. CT Abdomen and Pelvis 712 24. FINDINGS: Lower chest: Mild respiratory motion and atelectasis. Trace layering left pleural effusion. No pericardial effusion. No confluent lung opacity. Hepatobiliary: Trace perihepatic free fluid is possible on series 2, image 17. But otherwise the noncontrast liver appears stable with multiple benign liver hemangiomas and cysts better characterized on the contrast enhanced study earlier this month (no follow-up imaging recommended). Small layering gallstones are now evident on series 2, image 31. No convincing pericholecystic inflammation. Pancreas: Negative noncontrast appearance. Spleen: Negative. Adrenals/Urinary Tract: Stable adrenal glands and native renal atrophy. Decompressed urinary bladder. Stomach/Bowel: Similar fluid-filled but nondilated small bowel loops in much of the abdomen and pelvis. Normal appendix identified on coronal image 48. Similar large bowel retained stool. Stomach is mostly decompressed. No free air or discrete bowel inflammation identified in the absence of contrast. Vascular/Lymphatic: Severe calcified atherosclerosis. Extensive Aortoiliac calcified atherosclerosis. Stable mild tortuosity of the abdominal aorta. No lymphadenopathy identified. Reproductive: Negative. Other: No pelvis free fluid. Musculoskeletal: Scoliosis and mild lumbar spondylolisthesis. Previous right femur intramedullary rod removal. Stable visualized osseous structures. IMPRESSION: 1. Trace layering pleural fluid and perihepatic ascites, nonspecific. 2. Cholelithiasis. No noncontrast CT evidence of acute cholecystitis, but if there is right upper quadrant abdominal pain ultrasound would be valuable. 3. No other acute or inflammatory process identified in the noncontrast abdomen or pelvis. 4. Severe calcified atherosclerosis. Aortic Atherosclerosis (ICD10-I70.0). Electronically Signed   By:  Odessa Fleming M.D.   On: 09/14/2022 05:44   DG Chest Portable 1 View  Result Date: 09/14/2022 CLINICAL DATA:  67 year old male dialysis patient with shortness of breath x1 hour. EXAM: PORTABLE CHEST 1 VIEW COMPARISON:  Portable chest 09/06/2022 and earlier. FINDINGS: Portable AP upright view at 0404 hours. Stable right chest dual lumen dialysis type catheter. Prior CABG. Stable cardiac size and mediastinal contours. Visualized tracheal air column is within normal limits. Stable lung volumes. No pneumothorax, pleural effusion or consolidation. Regressed streaky perihilar opacity and Kerley B-lines seen earlier this month. No areas of worsening ventilation. Dextroconvex thoracic scoliosis. No acute osseous abnormality identified. Negative visible bowel gas. IMPRESSION: No acute cardiopulmonary abnormality. Electronically Signed   By: Odessa Fleming M.D.   On: 09/14/2022 04:10    PROCEDURES and INTERVENTIONS:  .1-3 Lead EKG Interpretation  Performed by: Delton Prairie, MD Authorized by: Delton Prairie, MD     Interpretation: normal     ECG rate:  90   ECG rate assessment: normal     Rhythm: sinus rhythm     Ectopy: none     Conduction: normal   .Critical Care  Performed by: Delton Prairie, MD Authorized by: Delton Prairie, MD   Critical care provider statement:    Critical care time (minutes):  30   Critical care time was exclusive of:  Separately billable procedures and treating other patients   Critical care was necessary to treat or prevent imminent or life-threatening deterioration of the following conditions:  Cardiac failure and metabolic crisis   Critical care was time spent personally by me on the following activities:  Development of treatment plan with patient or surrogate, discussions with consultants, evaluation of patient's response to treatment, examination of patient, ordering and review of laboratory studies, ordering and review of radiographic studies, ordering and performing treatments and  interventions, pulse oximetry, re-evaluation of patient's condition and review of old charts   Medications  heparin ADULT infusion 100 units/mL (25000 units/258mL) (750 Units/hr Intravenous Transfusing/Transfer 09/14/22 0553)  nitroGLYCERIN (NITROGLYN) 2 % ointment 1 inch (1 inch Topical Given 09/14/22 0403)  aspirin chewable tablet 324 mg (324 mg Oral Given 09/14/22 0507)  furosemide (LASIX) injection 60 mg (60 mg Intravenous Given 09/14/22 0507)  sodium zirconium cyclosilicate (LOKELMA) packet 10 g (10 g Oral Given 09/14/22 0535)  insulin aspart (novoLOG) injection 10 Units (10 Units Intravenous Given 09/14/22 0535)    And  dextrose 50 % solution 50 mL (50 mLs Intravenous Given 09/14/22 0535)  heparin bolus via infusion 3,400 Units (3,400 Units Intravenous Bolus from Bag 09/14/22 0549)     IMPRESSION / MDM / ASSESSMENT AND PLAN / ED COURSE  I reviewed the triage vital signs and the nursing notes.  Differential diagnosis includes, but is not limited to, pleural effusion, NSTEMI or ACS, volume overload, pneumonia, viral syndrome, sepsis  {Patient presents with symptoms of an acute illness or injury that is potentially life-threatening.  Patient presents to the ED dyspneic with evidence of an NSTEMI and hyperkalemia requiring medical admission.  EKG with nonspecific changes and no STEMI.  First troponin is elevated to the 300s.  CXR is clear.  Hyperkalemia is noted and protocols are started with insulin/D50, Lasix and Lokelma.  Started on heparin due to his fairly elevated troponin, and we will continue to trend these.  Consult medicine for admission  Clinical Course as of 09/14/22 0601  Tue Sep 14, 2022  0358 Reassessed.  Tachypneic and hypertensive but not hypoxic.  We will provide Nitropaste to alleviate his symptoms is very work him up [DS]  0457 Reassessed.  Patient looks more comfortable and he reports his breathing has improved but still somewhat dyspneic.  He is not telling me about some  periumbilical abdominal pain that he has had for the past week or 2.  He is somewhat tender on palpation.  We will get a CT scan.  We discussed NSTEMI and my recommendation for admission.  He is agreeable. [DS]  0550 Ct reassuring. Doubt cholecystitis. I consult with hospitalist who agrees to admit [DS]    Clinical Course User Index [DS] Delton Prairie, MD     FINAL CLINICAL IMPRESSION(S) / ED DIAGNOSES   Final diagnoses:  NSTEMI (non-ST elevated myocardial infarction) (HCC)  SOB (shortness of breath)  Hyperkalemia     Rx / DC Orders   ED Discharge Orders     None        Note:  This document was prepared using Dragon  voice recognition software and may include unintentional dictation errors.   Delton Prairie, MD 09/14/22 [phone]

## 2022-09-14 NOTE — Assessment & Plan Note (Signed)
Hemoglobin stable at 10.7 Monitor

## 2022-09-14 NOTE — Assessment & Plan Note (Addendum)
Transitioned to heparin drip in the setting of NSTEMI

## 2022-09-14 NOTE — Progress Notes (Signed)
  Received patient in bed to unit.   Informed consent signed and in chart.    TX duration: 3.5hrs     Transported back to ED Hand-off given to patient's nurse. Pt still c/o 4/10 abdominal pain.  No distress noted at this time    Access used: R HD Catheter  Access issues: none   Total UF removed: 2.5l Medication(s) given: NONE Post HD VS: 151/93 Post HD weight: 53.4KG     Lynann Beaver  [facility]

## 2022-09-14 NOTE — Assessment & Plan Note (Signed)
Baseline coronary artery disease status post CABG times 06/12/2018 Active NSTEMI in the setting of missed hemodialysis Started on heparin drip Continue ASA and statin Follow-up formal cardiology recommendations

## 2022-09-14 NOTE — Consult Note (Signed)
[facility] CARDIOLOGY CONSULT NOTE       Patient ID: Anthony Hess MRN: [id number] DOB/AGE: 65-Feb-1957 65 y.o.  Admit date: 09/14/2022 Referring Physician Dr. Doree Albee   Primary Physician Dr. Ronnald Ramp  Primary Cardiologist Dr. Briant Sites  Reason for Consultation chest pain  HPI: Anthony Hess is a 67yoM with a PMH of CAD s/p CABG x 4 (2020), HF recovered EF (55-60%, G1 DD 04/19/2022, prev 20-25%, g1DD, mod MR, mi-mod AR 2021), ESRD (T,TH,S HD), PAD, ongoing tobacco use, hx PE (eliquis), recent admission 3/11-15 for perforated duodenum w/ post-op intraabdominal abscess who presented to [facility] ED 09/14/22 with shortness of breath x 1 hour prior to presentation. Cardiology is consulted because of his elevated troponin.   Known to our service from multiple prior admissions. Presents today with 1 hour of dyspnea that woke him from sleep and felt better following dialysis. No exertional dyspnea. No abdominal swelling or peripheral edema. Reports of "chest pain" but points to his mid abdomen as to where it hurts the most, which has been ongoing/constant for 2 weeks without aggravating or relieving factors. Reproducible to palpation. No upper/substernal chest pain or other anginal sounding pain. Compliant with dialysis and medications but cannot name them to me. Currently no chest pain, shortness of breath, palpitations, orthopnea. Laying flat and appears comfortable. BP elevated this PM. EKG baseline abnormal with diffuse ST depressions, but stable compared to priors from 07/2022. Troponins elevated and flat trending 380, 350, 437 with repeats pending.   Review of systems complete and found to be negative unless listed above     Past Medical History:  Diagnosis Date   Aortic atherosclerosis (HCC)    Bilateral carotid artery disease (HCC)    Bladder cancer (HCC)    Coronary artery disease 12/20/2018   a.) LHC 12/20/2018: 50% OM1, 40% OM2, 95% o-pLAD, 75% o=pLCx, 40%  mLM, 70% D1, 60% mRCA-1, 50% mRCA-2; refer to CVTS. b.) 4v CABG at [facility] on 12/27/2018: LIMA-LAD, RIMA-PDA, seg LRA-OM1-D1   DCM (dilated cardiomyopathy) (HCC) 12/05/2018   a.) TTE 12/05/2018: EF 40-45%. b.) TTE 12/28/2019: EF 20-25%.   ESRD (end stage renal disease) (HCC)    a.) T-Th-Sat   HFrEF (heart failure with reduced ejection fraction) (HCC) 12/05/2018   a.) TTE 12/05/2018: EF 40-45%; mild LVH; ant/apical/sep HK; mild TR . b.) TTE 12/28/2019: EF 20-25%; mod LVH; mod MR/AR; G1DD.   History of 2019 novel coronavirus disease (COVID-19) 04/08/2021   History of kidney stones    HLD (hyperlipidemia)    Hx of CABG 12/27/2018   Hypertension    Infrarenal abdominal aortic aneurysm (AAA) without rupture (HCC) 03/05/2021   a.) CT abd/pelvis; measured 3.2 cm.   Melena 05/04/2022   Myocardial infarction [facility])    NSTEMI (non-ST elevated myocardial infarction) (HCC) 04/19/2022   Perforation bowel (HCC) 04/26/2022   PVD (peripheral vascular disease) (HCC)    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   a.) LIMA-LAD, RIMA-PDA, sequential LEFT radial artery to OM1 and D1   Sepsis (HCC) 03/14/2021   Wears glasses     Past Surgical History:  Procedure Laterality Date   AV FISTULA PLACEMENT Left 07/30/2021   Procedure: INSERTION OF ARTERIOVENOUS (AV) GORE-TEX GRAFT ARM BRACHIAL ARTERY TO AXILLARY VEIN;  Surgeon: Annice Needy, MD;  Location: [facility] ORS;  Service: Vascular;  Laterality: Left;   CAPD INSERTION N/A 12/31/2019   Procedure: LAPAROSCOPIC INSERTION CONTINUOUS AMBULATORY PERITONEAL DIALYSIS  (CAPD) CATHETER;  Surgeon: Leafy Ro, MD;  Location: [facility] ORS;  Service: General;  Laterality: N/A;   CAPD REMOVAL N/A 04/10/2020   Procedure: LAPAROSCOPIC REVISION OF CONTINUOUS AMBULATORY PERITONEAL DIALYSIS  (CAPD) CATHETER;  Surgeon: Leafy Ro, MD;  Location: [facility] ORS;  Service: General;  Laterality: N/A;   CORONARY ARTERY BYPASS GRAFT N/A 12/27/2018   Procedure: CORONARY ARTERY BYPASS GRAFTING (CABG) X 4 ON PUMP  USING RIGHT & LEFT INTERNAL MAMMARY ARTERY LEFT RADIAL ARTERY ENDOSCOPICALLY HARVESTED;  Surgeon: Linden Dolin, MD;  Location: [facility] OR;  Service: Open Heart Surgery;  Laterality: N/A;   CYSTOSCOPY W/ RETROGRADES Bilateral 05/15/2019   Procedure: CYSTOSCOPY WITH RETROGRADE PYELOGRAM;  Surgeon: Riki Altes, MD;  Location: [facility] ORS;  Service: Urology;  Laterality: Bilateral;   CYSTOSCOPY WITH BIOPSY N/A 05/15/2019   Procedure: CYSTOSCOPY WITH bladder BIOPSY;  Surgeon: Riki Altes, MD;  Location: [facility] ORS;  Service: Urology;  Laterality: N/A;   DIALYSIS/PERMA CATHETER INSERTION N/A 12/28/2019   Procedure: DIALYSIS/PERMA CATHETER INSERTION;  Surgeon: Annice Needy, MD;  Location: [facility] INVASIVE CV LAB;  Service: Cardiovascular;  Laterality: N/A;   DIALYSIS/PERMA CATHETER INSERTION N/A 03/18/2021   Procedure: DIALYSIS/PERMA CATHETER INSERTION;  Surgeon: Annice Needy, MD;  Location: [facility] INVAS[facility] CV LAB;  Service: Cardiovascular;  Laterality: N/A;   DIALYSIS/PERMA CATHETER REMOVAL N/A 06/02/2020   Procedure: DIALYSIS/PERMA CATHETER REMOVAL;  Surgeon: Annice Needy, MD;  Location: [facility] IN[facility] CV LAB;  Service: Cardiovascular;  Laterality: N/A;   EXCHANGE OF A DIALYSIS CATHETER Right 04/10/2020   Procedure: EXCHANGE OF A DIALYSIS CATHETER;  Surgeon: Leafy Ro, MD;  Location: [facility] ORS;  Service: General;  Laterality: Right;   INCISIONAL HERNIA REPAIR  01/20/2021   Procedure: HERNIA REPAIR INCISIONAL;  Surgeon: Henrene Dodge, MD;  Location: [facility] ORS;  Service: General;;   IR IMAGE GUIDED DRAINAGE PERCUT CATH  PERITONEAL RETROPERIT  04/07/2020   LAPAROSCOPY N/A 04/16/2021   Procedure: LAPAROSCOPY DIAGNOSTIC;  Surgeon: Sung Amabile, DO;  Location: [facility] ORS;  Service: General;  Laterality: N/A;   LAPAROTOMY N/A 04/26/2022   Procedure: EXPLORATORY LAPAROTOMY WITH REPAIR OF DUODENAL ULCER;  Surgeon: Carolan Shiver, MD;  Location: [facility] ORS;  Service: General;  Laterality: N/A;   LEFT HEART CATH AND  CORONARY ANGIOGRAPHY Left 12/20/2018   Procedure: LEFT HEART CATH AND CORONARY ANGIOGRAPHY;  Surgeon: Marcina Millard, MD;  Location: [facility];  Service: Cardiovascular;  Laterality: Left;   RADIAL ARTERY HARVEST Left 12/27/2018   Procedure: ENDOSCOPIC RADIAL ARTERY HARVEST;  Surgeon: Linden Dolin, MD;  Location: [facility];  Service: Open Heart Surgery;  Laterality: Left;   REMOVAL OF A DIALYSIS CATHETER Left 03/20/2021   Procedure: REMOVAL OF A PD CATHETER;  Surgeon: Annice Needy, MD;  Location: [facility] OR[facility];  Service: Vascular;  Laterality: Left;   REVISION OF ARTERIOVENOUS GORETEX GRAFT Left 09/11/2021   Procedure: Excisionof infected AV graft;  Surgeon: Renford Dills, MD;  Location: [facility] ORS;  Service: Vascular;  Laterality: Left;   TEE WITHOUT CARDIOVERSION N/A 12/27/2018   Procedure: TRANSESOPHAGEAL ECHOCARDIOGRAM (TEE);  Surgeon: Linden Dolin, MD;  Location: [facility] OR;  Service: Open Heart Surgery;  Laterality: N/A;   TUMOR REMOVAL  2019   Bladder    (Not in a hospital admission)  Social History   Socioeconomic History   Marital status: Married    Spouse name: Not on file   Number of children: Not on file   Years of education: Not on file   Highest education level: Not on file  Occupational History  Not on file  Tobacco Use   Smoking status: Every Day    Current packs/day: 0.00    Types: Cigarettes    Last attempt to quit: 02/19/2019    Years since quitting: 3.5   Smokeless tobacco: Never  Vaping Use   Vaping status: Never Used  Substance and Sexual Activity   Alcohol use: Never   Drug use: Never   Sexual activity: Yes  Other Topics Concern   Not on file  Social History Narrative   Not on file   Social Determinants of Health   Financial Resource Strain: Low Risk  (08/23/2022)   Overall Financial Resource Strain (CARDIA)    Difficulty of Paying Living Expenses: Not very hard  Food Insecurity: No Food Insecurity (09/06/2022)   Hunger Vital Sign     Worried About Running Out of Food in the Last Year: Never true    Ran Out of Food in the Last Year: Never true  Transportation Needs: No Transportation Needs (09/06/2022)   PRAPARE - Administrator, Civil Service (Medical): No    Lack of Transportation (Non-Medical): No  Physical Activity: Inactive (08/23/2022)   Exercise Vital Sign    Days of Exercise per Week: 0 days    Minutes of Exercise per Session: 0 min  Stress: No Stress Concern Present (08/23/2022)   [employer] of Occupational Health - Occupational Stress Questionnaire    Feeling of Stress : Not at all  Social Connections: Moderately Integrated (08/23/2022)   Social Connection and Isolation Panel [NHANES]    Frequency of Communication with Friends and Family: More than three times a week    Frequency of Social Gatherings with Friends and Family: Never    Attends Religious Services: More than 4 times per year    Active Member of [employer] or Organizations: No    Attends Banker Meetings: Never    Marital Status: Married  Catering manager Violence: Not At Risk (09/06/2022)   Humiliation, Afraid, Rape, and Kick questionnaire    Fear of Current or Ex-Partner: No    Emotionally Abused: No    Physically Abused: No    Sexually Abused: No    Family History  Problem Relation Age of Onset   Heart failure Mother      No intake or output data in the 24 hours ending 09/14/22 0948  Vitals:   09/14/22 0818 09/14/22 0844 09/14/22 0900 09/14/22 0922  BP: (!) 177/129 (!) 179/114 (!) 151/110   Pulse: 79  80 79  Resp: 20  (!) 25 19  Temp: 98.1 F (36.7 C)     TempSrc: Oral     SpO2: 94%  100% 100%    PHYSICAL EXAM General: thin middle aged male , well nourished, in no acute distress. Laying flat in bed HEENT:  Normocephalic and atraumatic. Neck:  No JVD.  Lungs: Normal respiratory effort on room air. Clear bilaterally to auscultation. No wheezes, crackles, rhonchi.  Heart: HRRR . Normal S1 and S2 without  gallops or murmurs.  Abdomen: flat, healed midline laparotomy scar present, generalized tenderness to palpation without rebound or guarding Msk: Normal strength and tone for age. Extremities: Warm and well perfused. No clubbing, cyanosis. No peripheral edema.  Neuro: Alert and oriented X 3. Psych:  Answers questions appropriately.   Labs: Basic Metabolic Panel: Recent Labs    09/14/22 0358 09/14/22 0810  NA 139 139  K 6.0* 4.5  CL 107 106  CO2 18* 18*  GLUCOSE 96 62*  BUN 83* 84*  CREATININE 8.83* 8.73*  CALCIUM 6.6* 6.5*   Liver Function Tests: Recent Labs    09/14/22 0358  AST 28  ALT 34  ALKPHOS 59  BILITOT 0.3  PROT 7.0  ALBUMIN 3.6   No results for input(s): "LIPASE", "AMYLASE" in the last 72 hours. CBC: Recent Labs    09/14/22 0358  WBC 15.3*  NEUTROABS 12.2*  HGB 10.7*  HCT 34.5*  MCV 90.8  PLT 335   Cardiac Enzymes: Recent Labs    09/14/22 0358 09/14/22 0542 09/14/22 0810  TROPONINIHS 380* 350* 437*   BNP: No results for input(s): "BNP" in the last 72 hours. D-Dimer: No results for input(s): "DDIMER" in the last 72 hours. Hemoglobin A1C: No results for input(s): "HGBA1C" in the last 72 hours. Fasting Lipid Panel: No results for input(s): "CHOL", "HDL", "LDLCALC", "TRIG", "CHOLHDL", "LDLDIRECT" in the last 72 hours. Thyroid Function Tests: No results for input(s): "TSH", "T4TOTAL", "T3FREE", "THYROIDAB" in the last 72 hours.  Invalid input(s): "FREET3" Anemia Panel: No results for input(s): "VITAMINB12", "FOLATE", "FERRITIN", "TIBC", "IRON", "RETICCTPCT" in the last 72 hours.   Radiology: CT ABDOMEN PELVIS WO CONTRAST  Result Date: 09/14/2022 CLINICAL DATA:  67 year old male dialysis patient with shortness of breath. Periumbilical abdominal pain. EXAM: CT ABDOMEN AND PELVIS WITHOUT CONTRAST TECHNIQUE: Multidetector CT imaging of the abdomen and pelvis was performed following the standard protocol without IV contrast. RADIATION DOSE  REDUCTION: This exam was performed according to the departmental dose-optimization program which includes automated exposure control, adjustment of the mA and/or kV according to patient size and/or use of iterative reconstruction technique. COMPARISON:  Portable chest x-ray 0404 hours today. CT Abdomen and Pelvis 712 24. FINDINGS: Lower chest: Mild respiratory motion and atelectasis. Trace layering left pleural effusion. No pericardial effusion. No confluent lung opacity. Hepatobiliary: Trace perihepatic free fluid is possible on series 2, image 17. But otherwise the noncontrast liver appears stable with multiple benign liver hemangiomas and cysts better characterized on the contrast enhanced study earlier this month (no follow-up imaging recommended). Small layering gallstones are now evident on series 2, image 31. No convincing pericholecystic inflammation. Pancreas: Negative noncontrast appearance. Spleen: Negative. Adrenals/Urinary Tract: Stable adrenal glands and native renal atrophy. Decompressed urinary bladder. Stomach/Bowel: Similar fluid-filled but nondilated small bowel loops in much of the abdomen and pelvis. Normal appendix identified on coronal image 48. Similar large bowel retained stool. Stomach is mostly decompressed. No free air or discrete bowel inflammation identified in the absence of contrast. Vascular/Lymphatic: Severe calcified atherosclerosis. Extensive Aortoiliac calcified atherosclerosis. Stable mild tortuosity of the abdominal aorta. No lymphadenopathy identified. Reproductive: Negative. Other: No pelvis free fluid. Musculoskeletal: Scoliosis and mild lumbar spondylolisthesis. Previous right femur intramedullary rod removal. Stable visualized osseous structures. IMPRESSION: 1. Trace layering pleural fluid and perihepatic ascites, nonspecific. 2. Cholelithiasis. No noncontrast CT evidence of acute cholecystitis, but if there is right upper quadrant abdominal pain ultrasound would be  valuable. 3. No other acute or inflammatory process identified in the noncontrast abdomen or pelvis. 4. Severe calcified atherosclerosis. Aortic Atherosclerosis (ICD10-I70.0). Electronically Signed   By: Odessa Fleming M.D.   On: 09/14/2022 05:44   DG Chest Portable 1 View  Result Date: 09/14/2022 CLINICAL DATA:  67 year old male dialysis patient with shortness of breath x1 hour. EXAM: PORTABLE CHEST 1 VIEW COMPARISON:  Portable chest 09/06/2022 and earlier. FINDINGS: Portable AP upright view at 0404 hours. Stable right chest dual lumen dialysis type catheter. Prior CABG. Stable cardiac size and mediastinal contours. Visualized tracheal air  column is within normal limits. Stable lung volumes. No pneumothorax, pleural effusion or consolidation. Regressed streaky perihilar opacity and Kerley B-lines seen earlier this month. No areas of worsening ventilation. Dextroconvex thoracic scoliosis. No acute osseous abnormality identified. Negative visible bowel gas. IMPRESSION: No acute cardiopulmonary abnormality. Electronically Signed   By: Odessa Fleming M.D.   On: 09/14/2022 04:10   DG Chest Port 1 View  Result Date: 09/06/2022 CLINICAL DATA:  67 year old male with possible sepsis. Missed dialysis over the weekend. EXAM: PORTABLE CHEST 1 VIEW COMPARISON:  Chest radiograph 08/17/2022 and earlier. FINDINGS: Portable AP upright view at 0509 hours. Stable right chest dual lumen dialysis type catheter. Chronic sternotomy, CABG. Dextroconvex scoliosis. Patchy and indistinct acute left perihilar and mid lung opacity, but also evidence of Kerley B lines bilaterally. Stable mediastinal contours when allowing for portable technique. No pneumothorax. No air bronchograms. No pleural effusion is evident. Negative visible bowel gas. No acute osseous abnormality identified. IMPRESSION: 1. Acute new indistinct left perihilar opacity, but bilateral Kerley B lines also visible. Favor asymmetric acute pulmonary edema. Superimposed left lung  aspiration or infection difficult to exclude. 2. Stable right chest dialysis catheter. Chronic sternotomy, CABG. Electronically Signed   By: Odessa Fleming M.D.   On: 09/06/2022 05:29   US Venous Img Lower Unilateral Right  Result Date: 09/03/2022 CLINICAL DATA:  67 year old male End stage renal disease patient with lower abdominal pain, left lower quadrant and right leg pain. Reportedly has a lower extremity DVT. EXAM: RIGHT LOWER EXTREMITY VENOUS DOPPLER ULTRASOUND TECHNIQUE: Gray-scale sonography with compression, as well as color and duplex ultrasound, were performed to evaluate the deep venous system(s) from the level of the common femoral vein through the popliteal and proximal calf veins. A COMPARISON:  CT Abdomen and Pelvis today reported separately. Bilateral lower extremity venous Doppler 04/20/2022. FINDINGS: Discussion via PRA with the technologist at 0523 hours confirms that the right lower extremity was imaged with Doppler ultrasound, although was inadvertently labeled "LT" throughout the series 1 images. The technologist did send corrected labels for this exam on series 2 images. VENOUS Normal compressibility of the common femoral, superficial femoral, and popliteal veins, as well as the visualized calf veins. Visualized portions of profunda femoral vein and great saphenous vein unremarkable. No filling defects to suggest DVT on grayscale or color Doppler imaging. Doppler waveforms show normal direction of venous flow, normal respiratory plasticity and response to augmentation. Limited views of the contralateral common femoral vein are unremarkable. OTHER None. Limitations: none IMPRESSION: No evidence of right lower extremity deep venous thrombosis. Electronically Signed   By: Odessa Fleming M.D.   On: 09/03/2022 05:30   CT ABDOMEN PELVIS W CONTRAST  Result Date: 09/03/2022 CLINICAL DATA:  67 year old male End stage renal disease patient with lower abdominal pain, left lower quadrant and right leg pain.  Reportedly has a lower extremity DVT. EXAM: CT ABDOMEN AND PELVIS WITH CONTRAST TECHNIQUE: Multidetector CT imaging of the abdomen and pelvis was performed using the standard protocol following bolus administration of intravenous contrast. RADIATION DOSE REDUCTION: This exam was performed according to the departmental dose-optimization program which includes automated exposure control, adjustment of the mA and/or kV according to patient size and/or use of iterative reconstruction technique. CONTRAST:  OMNIPAQUE IOHEXOL 300 MG/ML  SOLN COMPARISON:  CT Abdomen and Pelvis 08/10/2022. FINDINGS: Lower chest: Negative. Hepatobiliary: Stable liver with multiple benign hemangiomas and cysts (no follow-up imaging recommended). More decompressed gallbladder today. Pancreas: Negative. Spleen: Negative. Adrenals/Urinary Tract: Normal adrenal glands. Renal  atrophy again noted. Decompressed bladder. Stomach/Bowel: Gas distended rectum. Retained stool in the upstream colon but no other dilated large or small bowel. Paucity of peritoneal fat. Decompressed stomach. No free air or free fluid. Evidence of a normal gas containing appendix coronal images 42 through 51. No discrete bowel inflammation is identified. Vascular/Lymphatic: Severe Aortoiliac calcified atherosclerosis. Tortuous abdominal aorta, no discrete aortic aneurysm. Visible major arterial structures remain patent. There is no venous contrast for evaluation, but the portal venous system appears to be patent. No lymphadenopathy identified. Reproductive: Negative. Other: No pelvis free fluid. Musculoskeletal: Moderate thoracolumbar scoliosis. Evidence of previous right femur intramedullary rod, chronically removed. No acute osseous abnormality identified. IMPRESSION: 1. No acute or inflammatory process identified in the abdomen or pelvis. 2. Normal appendix. Large bowel retained stool. Chronic renal atrophy. Advanced Aortic Atherosclerosis (ICD10-I70.0).  Electronically Signed   By: Odessa Fleming M.D.   On: 09/03/2022 05:15   DG Chest 2 View  Result Date: 08/17/2022 CLINICAL DATA:  Chest pain, dialysis patient EXAM: CHEST - 2 VIEW COMPARISON:  08/10/2022 chest radiograph. FINDINGS: Right internal jugular dialysis catheter terminates over the right atrium. Intact sternotomy wires. Stable cardiomediastinal silhouette with normal heart size. No pneumothorax. No pleural effusion. Lungs appear clear, with no acute consolidative airspace disease and no pulmonary edema. IMPRESSION: No active cardiopulmonary disease. Electronically Signed   By: Delbert Phenix M.D.   On: 08/17/2022 09:04    ECHO 04/19/22 1. Left ventricular ejection fraction, by estimation, is 55 to 60%. The  left ventricle has normal function. The left ventricle has no regional  wall motion abnormalities. There is moderate concentric left ventricular  hypertrophy. Left ventricular  diastolic parameters are consistent with Grade I diastolic dysfunction  (impaired relaxation).   2. Right ventricular systolic function is normal. The right ventricular  size is normal. There is normal pulmonary artery systolic pressure.   3. The mitral valve is normal in structure. No evidence of mitral valve  regurgitation.   4. The aortic valve is tricuspid. Aortic valve regurgitation is not  visualized. Aortic valve sclerosis/calcification is present, without any  evidence of aortic stenosis.   5. The inferior vena cava is normal in size with greater than 50%  respiratory variability, suggesting right atrial pressure of 3 mmHg.   TELEMETRY reviewed by me (LT) 09/14/2022 : nsr 80s  EKG reviewed by me: nsr LVH incomplete RBBB, diffuse STD stable from prior in June 2024  Data reviewed by me (LT) 09/14/2022: ed ntoe, admission H&P, last 24h vitals tele labs imaging I/O    Active Problems:   Cigarette smoker   Atrial fibrillation, chronic (HCC)   Anemia in chronic kidney disease (CODE)   Leukocytosis    Abdominal pain   Hyperkalemia   Coronary artery disease involving coronary bypass graft of native heart   NSTEMI (non-ST elevated myocardial infarction) (HCC)   (HFpEF) heart failure with preserved ejection fraction (HCC)   HLD (hyperlipidemia)   Pulmonary embolism (HCC)    ASSESSMENT AND PLAN:  Anthony Hess is a [phone] with a PMH of CAD s/p CABG x 4 (2020), HF recovered EF (55-60%, G1 DD 04/19/2022, prev 20-25%, g1DD, mod MR, mi-mod AR 2021), ESRD (T,TH,S HD), PAD, ongoing tobacco use, hx PE (eliquis), recent admission 3/11-15 for perforated duodenum w/ post-op intraabdominal abscess who presented to [facility] ED 09/14/22 with shortness of breath x 1 hour prior to presentation. Cardiology is consulted because of his elevated troponin.   # shortness of breath # abdominal pain  Uncertain etiology. Dyspnea improved following dialysis. CT abd pelvis with cholelithiasis without U/S evidence of acute cholecystitis. Continues to have reproducible generalized abdominal and epigastric tenderness on exam. Management per primary.   # demand ischemia  # CAD s/p CABG x 4 Enzymes elevated but flat trend, also higher with ESRD/decreased clearance. EKG baseline abnormal, without significant change form prior from 07/2022. Inconsistent with NSTEMI - continue aspirin, atorva, metoprolol and other home antihypertensives - defer invasive cardiac diagnostics in the absence of chest pain.   # hx PE # ? History of atrial fibrillation Noted hx of PE from 03/2022 and taking eliquis 5mg  BID for this. He carries a diagnosis of AF in his chart, but I have not seen EKG or tele evidence of AF during this admission or during prior admissions when our service has been consulted. Continue tele monitoring while inpatient and ?outpatient ambulatory monitoring if indicated.   Cardiology will sign off. Please haiku with questions or re-engage if needed. Will arrange for oupatient follow up with [facility] in 1-2 weeks  This  patient's plan of care was discussed and created with Dr. Juliann Pares and he is in agreement.  Signed: Rebeca Allegra , PA-C 09/14/2022, 9:48 AM [facility]

## 2022-09-14 NOTE — Assessment & Plan Note (Signed)
On Eliquis chronically Transition to heparin drip in the setting of NSTEMI Continue rate controlling agents Follow

## 2022-09-14 NOTE — ED Notes (Signed)
Patient to dialysis at this time with transport and dialysis RN.

## 2022-09-14 NOTE — ED Notes (Signed)
Lab called to add on troponin to light green top sent at [phone]

## 2022-09-14 NOTE — Assessment & Plan Note (Signed)
Noted upper quadrant and right upper quadrant abdominal pain on presentation in the setting of NSTEMI and missed HD CT imaging with perihepatic ascites and cholelithiasis Given reproducible right upper quadrant tenderness to palpation on exam, will check abdominal ultrasound to better correlate Start IV PPI for gastritis comfort LFTs grossly stable at present Monitor-General surgery evaluation as appropriate

## 2022-09-14 NOTE — Assessment & Plan Note (Signed)
2D echo February 2024 with EF of 55 to 60% and grade 1 diastolic dysfunction Mild volume overload in the setting of missed hemodialysis Monitor volume status with HD Follow

## 2022-09-14 NOTE — Assessment & Plan Note (Signed)
1/2 pack a day smoker Discussed cessation Patient considering quitting Nicotine patch

## 2022-09-14 NOTE — H&P (Signed)
History and Physical    Patient: Anthony Hess ZOX:[id number] DOB: 1955/09/13 DOA: 09/14/2022 DOS: the patient was seen and examined on 09/14/2022 PCP: Ronnald Ramp, MD  Patient coming from: Home  Chief Complaint:  Chief Complaint  Patient presents with   Shortness of Breath    Pt BIB [facility] from home with c/o SOB that started x 1 hr ago. Dialysis patient T, Th, Friday. Denies Chest pain. Patient received duoneb from EMS.    HPI: Anthony Hess is a 67 y.o. male with medical history significant of ESRD on hemodialysis TTS, hypertension, peripheral vascular use, coronary artery disease status post CABG, HFpEF, atrial fibrillation on Eliquis, history of bladder cancer presenting with NSTEMI, hyperkalemia, missed HD, abdominal pain.  Patient reports worsening shortness of breath and abdominal pain over the past week or so.  Baseline hemodialysis Tuesday Thursday Saturday.  Patient states he missed his Saturday dialysis.  Unclear as to exactly why.  Abdominal pain generalized.  No vomiting or diarrhea.  Positive mild nausea.  No true chest pain.  Positive shortness of breath.  1/2 pack/day smoker.  No reported cough or wheezing.  No hemiparesis or confusion.  Symptoms have progressively worsened over this timeframe. Presented to the ER afebrile, hemodynamically stable.  White count 15.3, hemoglobin 10.7, platelets 335, troponin 380-350.  EKG normal sinus rhythm.  Chest x-ray normal.  CT abdomen pelvis with some.  Perihepatic ascites as well as cholelithiasis.  Right upper quadrant ultrasound pending  Review of Systems: As mentioned in the history of present illness. All other systems reviewed and are negative. Past Medical History:  Diagnosis Date   Aortic atherosclerosis (HCC)    Bilateral carotid artery disease (HCC)    Bladder cancer (HCC)    Coronary artery disease 12/20/2018   a.) LHC 12/20/2018: 50% OM1, 40% OM2, 95% o-pLAD, 75% o=pLCx, 40% mLM, 70% D1, 60% mRCA-1, 50%  mRCA-2; refer to CVTS. b.) 4v CABG at [facility] on 12/27/2018: LIMA-LAD, RIMA-PDA, seg LRA-OM1-D1   DCM (dilated cardiomyopathy) (HCC) 12/05/2018   a.) TTE 12/05/2018: EF 40-45%. b.) TTE 12/28/2019: EF 20-25%.   ESRD (end stage renal disease) (HCC)    a.) T-Th-Sat   HFrEF (heart failure with reduced ejection fraction) (HCC) 12/05/2018   a.) TTE 12/05/2018: EF 40-45%; mild LVH; ant/apical/sep HK; mild TR . b.) TTE 12/28/2019: EF 20-25%; mod LVH; mod MR/AR; G1DD.   History of 2019 novel coronavirus disease (COVID-19) 04/08/2021   History of kidney stones    HLD (hyperlipidemia)    Hx of CABG 12/27/2018   Hypertension    Infrarenal abdominal aortic aneurysm (AAA) without rupture (HCC) 03/05/2021   a.) CT abd/pelvis; measured 3.2 cm.   Melena 05/04/2022   Myocardial infarction [facility])    NSTEMI (non-ST elevated myocardial infarction) (HCC) 04/19/2022   Perforation bowel (HCC) 04/26/2022   PVD (peripheral vascular disease) (HCC)    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   a.) LIMA-LAD, RIMA-PDA, sequential LEFT radial artery to OM1 and D1   Sepsis (HCC) 03/14/2021   Wears glasses    Past Surgical History:  Procedure Laterality Date   AV FISTULA PLACEMENT Left 07/30/2021   Procedure: INSERTION OF ARTERIOVENOUS (AV) GORE-TEX GRAFT ARM BRACHIAL ARTERY TO AXILLARY VEIN;  Surgeon: Annice Needy, MD;  Location: [facility] ORS;  Service: Vascular;  Laterality: Left;   CAPD INSERTION N/A 12/31/2019   Procedure: LAPAROSCOPIC INSERTION CONTINUOUS AMBULATORY PERITONEAL DIALYSIS  (CAPD) CATHETER;  Surgeon: Leafy Ro, MD;  Location: [facility] ORS;  Service: General;  Laterality:  N/A;   CAPD REMOVAL N/A 04/10/2020   Procedure: LAPAROSCOPIC REVISION OF CONTINUOUS AMBULATORY PERITONEAL DIALYSIS  (CAPD) CATHETER;  Surgeon: Leafy Ro, MD;  Location: [facility] ORS;  Service: General;  Laterality: N/A;   CORONARY ARTERY BYPASS GRAFT N/A 12/27/2018   Procedure: CORONARY ARTERY BYPASS GRAFTING (CABG) X 4 ON PUMP USING RIGHT & LEFT INTERNAL  MAMMARY ARTERY LEFT RADIAL ARTERY ENDOSCOPICALLY HARVESTED;  Surgeon: Linden Dolin, MD;  Location: [facility];  Service: Open Heart Surgery;  Laterality: N/A;   CYSTOSCOPY W/ RETROGRADES Bilateral 05/15/2019   Procedure: CYSTOSCOPY WITH RETROGRADE PYELOGRAM;  Surgeon: Riki Altes, MD;  Location: [facility] OR[facility];  Service: Urology;  Laterality: Bilateral;   CYSTOSCOPY WITH BIOPSY N/A 05/15/2019   Procedure: CYSTOSCOPY WITH bladder BIOPSY;  Surgeon: Riki Altes, MD;  Location: [facility] OR[facility];  Service: Urology;  Laterality: N/A;   DIALYSIS/PERMA CATHETER INSERTION N/A 12/28/2019   Procedure: DIALYSIS/PERMA CATHETER INSERTION;  Surgeon: Annice Needy, MD;  Location: [facility];  Service: Cardiovascular;  Laterality: N/A;   DIALYSIS/PERMA CATHETER INSERTION N/A 03/18/2021   Procedure: DIALYSIS/PERMA CATHETER INSERTION;  Surgeon: Annice Needy, MD;  Location: [facility];  Service: Cardiovascular;  Laterality: N/A;   DIALYSIS/PERMA CATHETER REMOVAL N/A 06/02/2020   Procedure: DIALYSIS/PERMA CATHETER REMOVAL;  Surgeon: Annice Needy, MD;  Location: [facility];  Service: Cardiovascular;  Laterality: N/A;   EXCHANGE OF A DIALYSIS CATHETER Right 04/10/2020   Procedure: EXCHANGE OF A DIALYSIS CATHETER;  Surgeon: Leafy Ro, MD;  Location: [facility];  Service: General;  Laterality: Right;   INCISIONAL HERNIA REPAIR  01/20/2021   Procedure: HERNIA REPAIR INCISIONAL;  Surgeon: Henrene Dodge, MD;  Location: [facility];  Service: General;;   IR IMAGE GUIDED DRAINAGE PERCUT CATH  PERITONEAL RETROPERIT  04/07/2020   LAPAROSCOPY N/A 04/16/2021   Procedure: LAPAROSCOPY DIAGNOSTIC;  Surgeon: Sung Amabile, DO;  Location: [facility];  Service: General;  Laterality: N/A;   LAPAROTOMY N/A 04/26/2022   Procedure: EXPLORATORY LAPAROTOMY WITH REPAIR OF DUODENAL ULCER;  Surgeon: Carolan Shiver, MD;  Location: [facility];  Service: General;  Laterality: N/A;   LEFT HEART CATH AND CORONARY ANGIOGRAPHY Left  12/20/2018   Procedure: LEFT HEART CATH AND CORONARY ANGIOGRAPHY;  Surgeon: Marcina Millard, MD;  Location: [facility];  Service: Cardiovascular;  Laterality: Left;   RADIAL ARTERY HARVEST Left 12/27/2018   Procedure: ENDOSCOPIC RADIAL ARTERY HARVEST;  Surgeon: Linden Dolin, MD;  Location: [facility];  Service: Open Heart Surgery;  Laterality: Left;   REMOVAL OF A DIALYSIS CATHETER Left 03/20/2021   Procedure: REMOVAL OF A PD CATHETER;  Surgeon: Annice Needy, MD;  Location: [facility];  Service: Vascular;  Laterality: Left;   REVISION OF ARTERIOVENOUS GORETEX GRAFT Left 09/11/2021   Procedure: Excisionof infected AV graft;  Surgeon: Renford Dills, MD;  Location: [facility] OR[facility];  Service: Vascular;  Laterality: Left;   TEE WITHOUT CARDIOVERSION N/A 12/27/2018   Procedure: TRANSESOPHAGEAL ECHOCARDIOGRAM (TEE);  Surgeon: Linden Dolin, MD;  Location: [facility] OR;  Service: Open Heart Surgery;  Laterality: N/A;   TUMOR REMOVAL  2019   Bladder   Social History:  reports that he has been smoking cigarettes. He has never used smokeless tobacco. He reports that he does not drink alcohol and does not use drugs.  No Known Allergies  Family History  Problem Relation Age of Onset   Heart failure Mother     Prior to Admission medications   Medication  Sig Start Date End Date Taking? Authorizing Provider  amLODipine (NORVASC) 2.5 MG tablet Take 1 tablet (2.5 mg total) by mouth daily. 05/07/22   Alford Highland, MD  apixaban (ELIQUIS) 5 MG TABS tablet Take 1 tablet (5 mg total) by mouth 2 (two) times daily. 05/01/22   Baron Hamper, MD  Aspirin 81 MG CAPS Take by mouth. 05/06/20   [provider]  atorvastatin (LIPITOR) 80 MG tablet Take 1 tablet (80 mg total) by mouth daily. Patient not taking: Reported on 08/17/2022 04/21/22   Lurene Shadow, MD  calcitRIOL (ROCALTROL) 0.5 MCG capsule Take 1 capsule (0.5 mcg total) by mouth daily. 11/18/21   Sunnie Nielsen, DO  calcium acetate (PHOSLO)  667 MG capsule Take 1 capsule (667 mg total) by mouth 3 (three) times daily with meals. 11/18/21   Sunnie Nielsen, DO  hydrALAZINE (APRESOLINE) 25 MG tablet Take 1 tablet (25 mg total) by mouth 3 (three) times daily. Patient not taking: Reported on 08/17/2022 04/21/22   Lurene Shadow, MD  isosorbide dinitrate (ISORDIL) 10 MG tablet Take 1 tablet (10 mg total) by mouth 3 (three) times daily. 04/21/22   Lurene Shadow, MD  metoprolol succinate (TOPROL-XL) 25 MG 24 hr tablet Take 25 mg by mouth daily. 03/12/22   [provider]  ondansetron (ZOFRAN-ODT) 4 MG disintegrating tablet Take 1 tablet (4 mg total) by mouth every 8 (eight) hours as needed for nausea or vomiting. 06/25/22   Simmons-Robinson, Makiera, MD  pantoprazole (PROTONIX) 40 MG tablet Take 1 tablet (40 mg total) by mouth daily. 05/07/22 06/06/22  Alford Highland, MD  RENVELA 800 MG tablet Take 800 mg by mouth 3 (three) times daily with meals. 07/27/22   [provider]  sucroferric oxyhydroxide (VELPHORO) 500 MG chewable tablet Chew 1 tablet (500 mg total) by mouth 3 (three) times daily with meals. 11/18/21   Sunnie Nielsen, DO    Physical Exam: Vitals:   09/14/22 0348 09/14/22 0349 09/14/22 0400 09/14/22 0818  BP:  (!) 203/130 (!) 176/128 (!) 177/129  Pulse:  91 96 79  Resp:  20 (!) 29 20  Temp:  98.6 F (37 C)  98.1 F (36.7 C)  TempSrc:  Oral  Oral  SpO2: 100% 100% 94% 94%   Physical Exam Constitutional:      Appearance: He is normal weight.  HENT:     Head: Normocephalic.     Nose: Nose normal.     Mouth/Throat:     Mouth: Mucous membranes are moist.  Eyes:     Pupils: Pupils are equal, round, and reactive to light.  Cardiovascular:     Rate and Rhythm: Normal rate and regular rhythm.  Pulmonary:     Effort: Pulmonary effort is normal.  Abdominal:     General: Bowel sounds are normal.     Comments: Positive upper abdomen and right upper quadrant tenderness to palpation on exam  Musculoskeletal:         General: Normal range of motion.     Comments: Positive right lower extremity tenderness palpation in the gastrocs area  Skin:    General: Skin is warm.  Neurological:     General: No focal deficit present.  Psychiatric:        Mood and Affect: Mood normal.     Data Reviewed:  There are no new results to review at this time.  CT ABDOMEN PELVIS WO CONTRAST CLINICAL DATA:  67 year old male dialysis patient with shortness of breath. Periumbilical abdominal pain.  EXAM: CT  ABDOMEN AND PELVIS WITHOUT CONTRAST  TECHNIQUE: Multidetector CT imaging of the abdomen and pelvis was performed following the standard protocol without IV contrast.  RADIATION DOSE REDUCTION: This exam was performed according to the departmental dose-optimization program which includes automated exposure control, adjustment of the mA and/or kV according to patient size and/or use of iterative reconstruction technique.  COMPARISON:  Portable chest x-ray 0404 hours today. CT Abdomen and Pelvis 712 24.  FINDINGS: Lower chest: Mild respiratory motion and atelectasis. Trace layering left pleural effusion. No pericardial effusion. No confluent lung opacity.  Hepatobiliary: Trace perihepatic free fluid is possible on series 2, image 17. But otherwise the noncontrast liver appears stable with multiple benign liver hemangiomas and cysts better characterized on the contrast enhanced study earlier this month (no follow-up imaging recommended). Small layering gallstones are now evident on series 2, image 31. No convincing pericholecystic inflammation.  Pancreas: Negative noncontrast appearance.  Spleen: Negative.  Adrenals/Urinary Tract: Stable adrenal glands and native renal atrophy. Decompressed urinary bladder.  Stomach/Bowel: Similar fluid-filled but nondilated small bowel loops in much of the abdomen and pelvis. Normal appendix identified on coronal image 48. Similar large bowel retained stool.  Stomach is mostly decompressed. No free air or discrete bowel inflammation identified in the absence of contrast.  Vascular/Lymphatic: Severe calcified atherosclerosis. Extensive Aortoiliac calcified atherosclerosis. Stable mild tortuosity of the abdominal aorta. No lymphadenopathy identified.  Reproductive: Negative.  Other: No pelvis free fluid.  Musculoskeletal: Scoliosis and mild lumbar spondylolisthesis. Previous right femur intramedullary rod removal. Stable visualized osseous structures.  IMPRESSION: 1. Trace layering pleural fluid and perihepatic ascites, nonspecific. 2. Cholelithiasis. No noncontrast CT evidence of acute cholecystitis, but if there is right upper quadrant abdominal pain ultrasound would be valuable. 3. No other acute or inflammatory process identified in the noncontrast abdomen or pelvis. 4. Severe calcified atherosclerosis. Aortic Atherosclerosis (ICD10-I70.0).  Electronically Signed   By: Odessa Fleming M.D.   On: 09/14/2022 05:44 DG Chest Portable 1 View CLINICAL DATA:  68 year old male dialysis patient with shortness of breath x1 hour.  EXAM: PORTABLE CHEST 1 VIEW  COMPARISON:  Portable chest 09/06/2022 and earlier.  FINDINGS: Portable AP upright view at 0404 hours. Stable right chest dual lumen dialysis type catheter. Prior CABG. Stable cardiac size and mediastinal contours. Visualized tracheal air column is within normal limits. Stable lung volumes. No pneumothorax, pleural effusion or consolidation. Regressed streaky perihilar opacity and Kerley B-lines seen earlier this month. No areas of worsening ventilation.  Dextroconvex thoracic scoliosis. No acute osseous abnormality identified. Negative visible bowel gas.  IMPRESSION: No acute cardiopulmonary abnormality.  Electronically Signed   By: Odessa Fleming M.D.   On: 09/14/2022 04:10  Lab Results  Component Value Date   WBC 15.3 (H) 09/14/2022   HGB 10.7 (L) 09/14/2022   HCT 34.5 (L)  09/14/2022   MCV 90.8 09/14/2022   PLT 335 09/14/2022   Last metabolic panel Lab Results  Component Value Date   GLUCOSE 96 09/14/2022   NA 139 09/14/2022   K 6.0 (H) 09/14/2022   CL 107 09/14/2022   CO2 18 (L) 09/14/2022   BUN 83 (H) 09/14/2022   CREATININE 8.83 (H) 09/14/2022   GFRNONAA 6 (L) 09/14/2022   CALCIUM 6.6 (L) 09/14/2022   PHOS 6.0 (H) 09/08/2022   PROT 7.0 09/14/2022   ALBUMIN 3.6 09/14/2022   LABGLOB 2.4 06/25/2022   AGRATIO 1.8 06/25/2022   BILITOT 0.3 09/14/2022   ALKPHOS 59 09/14/2022   AST 28 09/14/2022   ALT 34 09/14/2022  ANIONGAP 14 09/14/2022    Assessment and Plan: NSTEMI (non-ST elevated myocardial infarction) (HCC) Trop 380 though w/ predominant upper abdominal pain in setting of baseline CAD s/p CABG and missed HD  EKG stable  Started on heparin gtt in ER  S/p ASA  [facility] Cardiology consulted for formal evaluation    (HFpEF) heart failure with preserved ejection fraction (HCC) 2D echo February 2024 with EF of 55 to 60% and grade 1 diastolic dysfunction Mild volume overload in the setting of missed hemodialysis Monitor volume status with HD Follow  Hyperkalemia K 6 in setting of missed HD  No EKG changes  S/p lokelma in the ER  Getting insulin and albuterol per hyperkalemia protocol  Dr. Thedore Mins w/ nephrology consulted for further evaluation for HD   Abdominal pain Noted upper quadrant and right upper quadrant abdominal pain on presentation in the setting of NSTEMI and missed HD CT imaging with perihepatic ascites and cholelithiasis Given reproducible right upper quadrant tenderness to palpation on exam, will check abdominal ultrasound to better correlate Start IV PPI for gastritis comfort LFTs grossly stable at present Monitor-General surgery evaluation as appropriate  Pulmonary embolism (HCC) Transitioned to heparin drip in the setting of NSTEMI  Atrial fibrillation, chronic (HCC) On Eliquis chronically Transition to heparin drip  in the setting of NSTEMI Continue rate controlling agents Follow  HLD (hyperlipidemia) Statin  Leukocytosis White count 15 on presentation Suspect likely reactive as this appears to be a fairly chronic issue in review of prior labs No overt source of infection at present Will otherwise monitor Reassess if patient spikes a fever Follow  Cigarette smoker 1/2 pack a day smoker Discussed cessation Patient considering quitting Nicotine patch  Coronary artery disease involving coronary bypass graft of native heart Baseline coronary artery disease status post CABG times 06/12/2018 Active NSTEMI in the setting of missed hemodialysis Started on heparin drip Continue ASA and statin Follow-up formal cardiology recommendations  Anemia in chronic kidney disease (CODE) Hemoglobin stable at 10.7 Monitor  HFrEF (heart failure with reduced ejection fraction) (HCC)-resolved as of 09/14/2022      Greater than 50% was spent in counseling and coordination of care with patient Total encounter time 80 minutes or more   Advance Care Planning:   Code Status: Full Code   Consults: Cardiology, Nephrology  Family Communication: No family at the bedside   Severity of Illness: The appropriate patient status for this patient is INPATIENT. Inpatient status is judged to be reasonable and necessary in order to provide the required intensity of service to ensure the patient's safety. The patient's presenting symptoms, physical exam findings, and initial radiographic and laboratory data in the context of their chronic comorbidities is felt to place them at high risk for further clinical deterioration. Furthermore, it is not anticipated that the patient will be medically stable for discharge from the hospital within 2 midnights of admission.   * I certify that at the point of admission it is my clinical judgment that the patient will require inpatient hospital care spanning beyond 2 midnights from the  point of admission due to high intensity of service, high risk for further deterioration and high frequency of surveillance required.*  Author: Floydene Flock, MD 09/14/2022 8:32 AM  For on call review [email or web address].

## 2022-09-14 NOTE — ED Notes (Signed)
Report given to dialysis at this time. 

## 2022-09-14 NOTE — Assessment & Plan Note (Signed)
K 6 in setting of missed HD  No EKG changes  S/p lokelma in the ER  Getting insulin and albuterol per hyperkalemia protocol  Dr. Thedore Mins w/ nephrology consulted for further evaluation for HD

## 2022-09-14 NOTE — ED Notes (Signed)
This RN at bedside to receive informed consent for hemodialysis.

## 2022-09-14 NOTE — Progress Notes (Addendum)
ANTICOAGULATION CONSULT NOTE  Pharmacy Consult for Heparin Infusion Indication: ACS/STEMI  No Known Allergies  Patient Measurements: Height: 5\' 2"  (157.5 cm) Weight: 53.4 kg (117 lb 11.6 oz) IBW/kg (Calculated) : 54.6 Heparin Dosing Weight: 57.4 kg  Vital Signs: Temp: 97.3 F (36.3 C) (07/23 1520) Temp Source: Oral (07/23 1520) BP: 146/101 (07/23 1520) Pulse Rate: 79 (07/23 1520)  Labs: Recent Labs    09/14/22 0358 09/14/22 0542 09/14/22 0810 09/14/22 1530  HGB 10.7*  --   --   --   HCT 34.5*  --   --   --   PLT 335  --   --   --   APTT  --  31  --   --   LABPROT  --  14.4  --   --   INR  --  1.1  --   --   HEPARINUNFRC  --  <0.10*  --  0.17*  CREATININE 8.83*  --  8.73*  --   TROPONINIHS 380* 350* 437*  --     Estimated Creatinine Clearance: 6.5 mL/min (A) (by C-G formula based on SCr of 8.73 mg/dL (H)).   Medical History: Past Medical History:  Diagnosis Date   Aortic atherosclerosis (HCC)    Bilateral carotid artery disease (HCC)    Bladder cancer (HCC)    Coronary artery disease 12/20/2018   a.) LHC 12/20/2018: 50% OM1, 40% OM2, 95% o-pLAD, 75% o=pLCx, 40% mLM, 70% D1, 60% mRCA-1, 50% mRCA-2; refer to CVTS. b.) [facility]v CABG at [facility] on 12/27/2018: LIMA-LAD, RIMA-PDA, seg LRA-OM1-D1   DCM (dilated cardiomyopathy) (HCC) 12/05/2018   a.) TTE 12/05/2018: EF 40-45%. b.) TTE 12/28/2019: EF 20-25%.   ESRD (end stage renal disease) (HCC)    a.) T-Th-Sat   HFrEF (heart failure with reduced ejection fraction) (HCC) 12/05/2018   a.) TTE 12/05/2018: EF 40-45%; mild LVH; ant/apical/sep HK; mild TR . b.) TTE 12/28/2019: EF 20-25%; mod LVH; mod MR/AR; G1DD.   History of 2019 novel coronavirus disease (COVID-19) 04/08/2021   History of kidney stones    HLD (hyperlipidemia)    Hx of CABG 12/27/2018   Hypertension    Infrarenal abdominal aortic aneurysm (AAA) without rupture (HCC) 03/05/2021   a.) CT abd/pelvis; measured 3.2 cm.   Melena 05/04/2022   Myocardial infarction  [facility])    NSTEMI (non-ST elevated myocardial infarction) (HCC) 04/19/2022   Perforation bowel (HCC) 04/26/2022   PVD (peripheral vascular disease) (HCC)    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   a.) LIMA-LAD, RIMA-PDA, sequential LEFT radial artery to OM1 and D1   Sepsis (HCC) 03/14/2021   Wears glasses    Assessment: Anthony Hess is a 67 y.o. male presenting with SOB. PMH significant for ESRD on HD TTS, HTN, CAD s/p CABG, HFpEF, AF on Eliquis, h/o bladder cancer, PE (09/06/2022). Patient was on apixaban PTA per chart review. Timing of last dose of apixaban unknown. Cardiology evaluating patient for possible NSTEMI. Pharmacy has been consulted to initiate and manage heparin infusion.   Baseline Labs: aPTT 31, HL <0.10,  PT 14.4, INR 1.1, Hgb 10.7, Hct 34.5, Plt 335   Goal of Therapy:  Heparin level 0.3-0.7 units/ml aPTT 66-102 seconds Monitor platelets by anticoagulation protocol: Yes   Date Time HL Rate/Comment  7/23 1530 0.17 750/subtherapeutic   Plan:  Give 1750 units bolus x 1 Increase heparin infusion to 950 units/hr Check HL in 8 hours  Continue to monitor H&H and platelets daily while on heparin infusion   Celene Squibb,  PharmD Clinical Pharmacist 09/14/2022 4:14 PM

## 2022-09-14 NOTE — Assessment & Plan Note (Signed)
Trop 380 though w/ predominant upper abdominal pain in setting of baseline CAD s/p CABG and missed HD  EKG stable  Started on heparin gtt in ER  S/p ASA  [facility] Cardiology consulted for formal evaluation

## 2022-09-14 NOTE — Progress Notes (Addendum)
[facility]  ROUNDING NOTE   Subjective:   Anthony Hess is a 67 year old male with past medical conditions including hypertension, PVD, dyslipidemia, CAD, four-vessel CABG, and end-stage renal disease on hemodialysis. Patient presents to the emergency department with complaints of shortness of breath. Paietn has been admitted for Hyperkalemia [E87.5] SOB (shortness of breath) [R06.02] NSTEMI (non-ST elevated myocardial infarction) [facility]) [I21.4]   Patient is known to our practice and receives outpatient dialysis at [facility], followed by Dr Cherylann Ratel. He presents to the ED with chest pain that began a couple days ago. He presented to ED when he began having some shortness of breath. States he did receive and complete treatment on Saturday. He was scheduled to receive additional treatment later today  Patient seen and evaluated during dialysis   HEMODIALYSIS FLOWSHEET:  Blood Flow Rate (mL/min): 400 mL/min Arterial Pressure (mmHg): -170 mmHg Venous Pressure (mmHg): 170 mmHg TMP (mmHg): 7 mmHg Ultrafiltration Rate (mL/min): 971 mL/min Dialysate Flow Rate (mL/min): 300 ml/min  Seen resting comfortably. Room air, no lower extremity edema.   Labs on ED arrival include potassium 6.0 with troponin 380.Troponin 437 this morning. Cardiology consulted  We have been consulted to manage dialysis needs during this admission.    Objective:  Vital signs in last 24 hours:  Temp:  [97.8 F (36.6 C)-98.6 F (37 C)] 97.8 F (36.6 C) (07/23 1031) Pulse Rate:  [71-96] 81 (07/23 1115) Resp:  [17-29] 17 (07/23 1115) BP: (151-203)/(97-130) 175/115 (07/23 1115) SpO2:  [94 %-100 %] 96 % (07/23 1115) Weight:  [55.7 kg] 55.7 kg (07/23 1040)  Weight change:  Filed Weights   09/14/22 1040  Weight: 55.7 kg    Intake/Output: No intake/output data recorded.   Intake/Output this shift:  No intake/output data recorded.  Physical Exam: General: NAD  Head: Normocephalic, atraumatic.    Eyes: Anicteric  Lungs:  Clear to auscultation, normal effort  Heart: Regular rate and rhythm  Abdomen:  Soft, nontender  Extremities:  No peripheral edema.  Neurologic: Alert and oriented, moving all four extremities  Skin: No lesions  Access: Rt chest permcath    Basic Metabolic Panel: Recent Labs  Lab 09/08/22 0342 09/14/22 0358 09/14/22 0810  NA 137 139 139  K 4.1 6.0* 4.5  CL 99 107 106  CO2 28 18* 18*  GLUCOSE 84 96 62*  BUN 36* 83* 84*  CREATININE 5.02* 8.83* 8.73*  CALCIUM 6.7* 6.6* 6.5*  PHOS 6.0*  --   --     Liver Function Tests: Recent Labs  Lab 09/08/22 0342 09/14/22 0358  AST  --  28  ALT  --  34  ALKPHOS  --  59  BILITOT  --  0.3  PROT  --  7.0  ALBUMIN 2.6* 3.6   No results for input(s): "LIPASE", "AMYLASE" in the last 168 hours.  No results for input(s): "AMMONIA" in the last 168 hours.  CBC: Recent Labs  Lab 09/08/22 0342 09/14/22 0358  WBC 9.0 15.3*  NEUTROABS  --  12.2*  HGB 10.6* 10.7*  HCT 34.3* 34.5*  MCV 90.0 90.8  PLT 320 335    Cardiac Enzymes: No results for input(s): "CKTOTAL", "CKMB", "CKMBINDEX", "TROPONINI" in the last 168 hours.  BNP: Invalid input(s): "POCBNP"  CBG: Recent Labs  Lab 09/14/22 0921  GLUCAP 155*    Microbiology: Results for orders placed or performed during the hospital encounter of 09/14/22  SARS Coronavirus 2 by RT PCR (hospital order, performed in [facility] hospital lab) *cepheid  single result test* Anterior Nasal Swab     Status: None   Collection Time: 09/14/22  3:58 AM   Specimen: Anterior Nasal Swab  Result Value Ref Range Status   SARS Coronavirus 2 by RT PCR NEGATIVE NEGATIVE Final    Comment: (NOTE) SARS-CoV-2 target nucleic acids are NOT DETECTED.  The SARS-CoV-2 RNA is generally detectable in upper and lower respiratory specimens during the acute phase of infection. The lowest concentration of SARS-CoV-2 viral copies this assay can detect is 250 copies / mL. A negative  result does not preclude SARS-CoV-2 infection and should not be used as the sole basis for treatment or other patient management decisions.  A negative result may occur with improper specimen collection / handling, submission of specimen other than nasopharyngeal swab, presence of viral mutation(s) within the areas targeted by this assay, and inadequate number of viral copies (<250 copies / mL). A negative result must be combined with clinical observations, patient history, and epidemiological information.  Fact Sheet for Patients:   RoadLapTop.co.za  Fact Sheet for Healthcare Providers: http://kim-miller.com/  This test is not yet approved or  cleared by the [place] FDA and has been authorized for detection and/or diagnosis of SARS-CoV-2 by FDA under an Emergency Use Authorization (EUA).  This EUA will remain in effect (meaning this test can be used) for the duration of the COVID-19 declaration under Section 564(b)(1) of the Act, 21 U.S.C. section 360bbb-3(b)(1), unless the authorization is terminated or revoked sooner.  Performed at [facility], [address] [place], [place] [address]     Coagulation Studies: Recent Labs    09/14/22 0542  LABPROT 14.4  INR 1.1    Urinalysis: No results for input(s): "COLORURINE", "LABSPEC", "PHURINE", "GLUCOSEU", "HGBUR", "BILIRUBINUR", "KETONESUR", "PROTEINUR", "UROBILINOGEN", "NITRITE", "LEUKOCYTESUR" in the last 72 hours.  Invalid input(s): "APPERANCEUR"    Imaging: US Abdomen Limited RUQ (LIVER/GB)  Result Date: 09/14/2022 CLINICAL DATA:  Right upper quadrant pain EXAM: ULTRASOUND ABDOMEN LIMITED RIGHT UPPER QUADRANT COMPARISON:  CT 09/14/2022 FINDINGS: Gallbladder: Distended gallbladder with layering stones. No wall thickening or adjacent fluid. Common bile duct: Diameter: 4 mm Liver: Heterogeneous parenchyma with some echogenic liver lesions identified such as right  hepatic lobe superiorly measuring 2.1 x 1.3 x 3.0 cm. Smaller focus seen elsewhere measures 15 mm. Portal vein is patent on color Doppler imaging with normal direction of blood flow towards the liver. Other: Small amount of ascites. IMPRESSION: Distended gallbladder with layering stones. No wall thickening. No biliary ductal dilatation. No further sonographic evidence of acute cholecystitis. Echogenic liver lesions identified of uncertain etiology. Please correlate for any known prior workup. Electronically Signed   By: Karen Kays M.D.   On: 09/14/2022 11:14   US Venous Img Lower Bilateral (DVT)  Result Date: 09/14/2022 CLINICAL DATA:  67 year old male with lower extremity pain EXAM: BILATERAL LOWER EXTREMITY VENOUS DOPPLER ULTRASOUND TECHNIQUE: Gray-scale sonography with graded compression, as well as color Doppler and duplex ultrasound were performed to evaluate the lower extremity deep venous systems from the level of the common femoral vein and including the common femoral, femoral, profunda femoral, popliteal and calf veins including the posterior tibial, peroneal and gastrocnemius veins when visible. The superficial great saphenous vein was also interrogated. Spectral Doppler was utilized to evaluate flow at rest and with distal augmentation maneuvers in the common femoral, femoral and popliteal veins. COMPARISON:  None Available. FINDINGS: RIGHT LOWER EXTREMITY Common Femoral Vein: No evidence of thrombus. Normal compressibility, respiratory phasicity and response to augmentation.  Saphenofemoral Junction: No evidence of thrombus. Normal compressibility and flow on color Doppler imaging. Profunda Femoral Vein: No evidence of thrombus. Normal compressibility and flow on color Doppler imaging. Femoral Vein: No evidence of thrombus. Normal compressibility, respiratory phasicity and response to augmentation. Popliteal Vein: No evidence of thrombus. Normal compressibility, respiratory phasicity and response  to augmentation. Calf Veins: No evidence of thrombus. Normal compressibility and flow on color Doppler imaging. Superficial Great Saphenous Vein: No evidence of thrombus. Normal compressibility and flow on color Doppler imaging. Other Findings:  None. LEFT LOWER EXTREMITY Common Femoral Vein: No evidence of thrombus. Normal compressibility, respiratory phasicity and response to augmentation. Saphenofemoral Junction: No evidence of thrombus. Normal compressibility and flow on color Doppler imaging. Profunda Femoral Vein: No evidence of thrombus. Normal compressibility and flow on color Doppler imaging. Femoral Vein: No evidence of thrombus. Normal compressibility, respiratory phasicity and response to augmentation. Popliteal Vein: No evidence of thrombus. Normal compressibility, respiratory phasicity and response to augmentation. Calf Veins: No evidence of thrombus. Normal compressibility and flow on color Doppler imaging. Superficial Great Saphenous Vein: No evidence of thrombus. Normal compressibility and flow on color Doppler imaging. Other Findings:  None. IMPRESSION: Directed duplex of the bilateral lower extremity negative for DVT Signed, Yvone Neu. Miachel Roux, RPVI [facility] Electronically Signed   By: Gilmer Mor D.O.   On: 09/14/2022 10:34   CT ABDOMEN PELVIS WO CONTRAST  Result Date: 09/14/2022 CLINICAL DATA:  67 year old male dialysis patient with shortness of breath. Periumbilical abdominal pain. EXAM: CT ABDOMEN AND PELVIS WITHOUT CONTRAST TECHNIQUE: Multidetector CT imaging of the abdomen and pelvis was performed following the standard protocol without IV contrast. RADIATION DOSE REDUCTION: This exam was performed according to the departmental dose-optimization program which includes automated exposure control, adjustment of the mA and/or kV according to patient size and/or use of iterative reconstruction technique. COMPARISON:   Portable chest x-ray 0404 hours today. CT Abdomen and Pelvis 712 24. FINDINGS: Lower chest: Mild respiratory motion and atelectasis. Trace layering left pleural effusion. No pericardial effusion. No confluent lung opacity. Hepatobiliary: Trace perihepatic free fluid is possible on series 2, image 17. But otherwise the noncontrast liver appears stable with multiple benign liver hemangiomas and cysts better characterized on the contrast enhanced study earlier this month (no follow-up imaging recommended). Small layering gallstones are now evident on series 2, image 31. No convincing pericholecystic inflammation. Pancreas: Negative noncontrast appearance. Spleen: Negative. Adrenals/Urinary Tract: Stable adrenal glands and native renal atrophy. Decompressed urinary bladder. Stomach/Bowel: Similar fluid-filled but nondilated small bowel loops in much of the abdomen and pelvis. Normal appendix identified on coronal image 48. Similar large bowel retained stool. Stomach is mostly decompressed. No free air or discrete bowel inflammation identified in the absence of contrast. Vascular/Lymphatic: Severe calcified atherosclerosis. Extensive Aortoiliac calcified atherosclerosis. Stable mild tortuosity of the abdominal aorta. No lymphadenopathy identified. Reproductive: Negative. Other: No pelvis free fluid. Musculoskeletal: Scoliosis and mild lumbar spondylolisthesis. Previous right femur intramedullary rod removal. Stable visualized osseous structures. IMPRESSION: 1. Trace layering pleural fluid and perihepatic ascites, nonspecific. 2. Cholelithiasis. No noncontrast CT evidence of acute cholecystitis, but if there is right upper quadrant abdominal pain ultrasound would be valuable. 3. No other acute or inflammatory process identified in the noncontrast abdomen or pelvis. 4. Severe calcified atherosclerosis. Aortic Atherosclerosis (ICD10-I70.0). Electronically Signed   By: Odessa Fleming M.D.   On: 09/14/2022 05:44   DG Chest  Portable 1 View  Result Date: 09/14/2022 CLINICAL DATA:  67 year old  male dialysis patient with shortness of breath x1 hour. EXAM: PORTABLE CHEST 1 VIEW COMPARISON:  Portable chest 09/06/2022 and earlier. FINDINGS: Portable AP upright view at 0404 hours. Stable right chest dual lumen dialysis type catheter. Prior CABG. Stable cardiac size and mediastinal contours. Visualized tracheal air column is within normal limits. Stable lung volumes. No pneumothorax, pleural effusion or consolidation. Regressed streaky perihilar opacity and Kerley B-lines seen earlier this month. No areas of worsening ventilation. Dextroconvex thoracic scoliosis. No acute osseous abnormality identified. Negative visible bowel gas. IMPRESSION: No acute cardiopulmonary abnormality. Electronically Signed   By: Odessa Fleming M.D.   On: 09/14/2022 04:10     Medications:    anticoagulant sodium citrate     heparin 750 Units/hr (09/14/22 0548)     Chlorhexidine Gluconate Cloth  6 each Topical Q0600   nicotine  14 mg Transdermal Daily   pantoprazole (PROTONIX) IV  40 mg Intravenous Q12H   acetaminophen, alteplase, anticoagulant sodium citrate, heparin, hydrALAZINE, lidocaine (PF), lidocaine-prilocaine, ondansetron (ZOFRAN) IV, pentafluoroprop-tetrafluoroeth  Assessment/ Plan:  [name] is a 67 y.o.  male with past medical conditions including hypertension, PVD, dyslipidemia, CAD, four-vessel CABG, and end-stage renal disease on hemodialysis. Patient presents to the emergency department with complaints of shortness of breath. He will be admitted for Hyperkalemia [E87.5] SOB (shortness of breath) [R06.02] NSTEMI (non-ST elevated myocardial infarction) (HCC) [I21.4]   End stage renal disease with hyperkalemia on hemodialysis.  Last treatment received on Saturday. Potassium 6.0, given Lokelma and insulin in ED. Potassium 4.5 at initiation of dialysis. Will dialyze on 2K bath and recheck potassium later today. Next treatment  scheduled for Thursday.   2. Anemia of chronic kidney disease Lab Results  Component Value Date   HGB 10.7 (L) 09/14/2022    Hgb at goal  4. Secondary Hyperparathyroidism: with outpatient labs: None available    Lab Results  Component Value Date   PTH 170 (H) 06/25/2022   CALCIUM 6.5 (L) 09/14/2022   CAION 0.84 (LL) 09/11/2021   PHOS 6.0 (H) 09/08/2022    Serum calcium remains 6.5. Calcitriol and calcium acetate with meals prescribed outpatient.    5. Hypertension with chronic kidney disease. Home regimen includes amlodipine, hydralazine, isosorbide, and metoprolol.   Blood pressure  156/97 during dialysis  6. NSTEMI with troponin 380, peaked to 437. EKG stable, heparin drip in place. Cardiology consulted   LOS: 0 Dishon Kehoe 7/23/202411:23 AM

## 2022-09-14 NOTE — Assessment & Plan Note (Signed)
?   Statin.

## 2022-09-14 NOTE — Assessment & Plan Note (Signed)
White count 15 on presentation Suspect likely reactive as this appears to be a fairly chronic issue in review of prior labs No overt source of infection at present Will otherwise monitor Reassess if patient spikes a fever Follow

## 2022-09-14 NOTE — Transitions of Care (Post Inpatient/ED Visit) (Signed)
   09/14/2022  Name: Yale Golla MRN: [id number] DOB: 12/01/1955  Today's TOC FU Call Status: Today's TOC FU Call Status:: Unsuccessful Call (3rd Attempt) Unsuccessful Call (1st Attempt) Date: 09/09/22 Unsuccessful Call (2nd Attempt) Date: 09/13/22 Unsuccessful Call (3rd Attempt) Date: 09/14/22  Attempted to reach the patient regarding the most recent Inpatient/ED visit.  Follow Up Plan: No further outreach attempts will be made at this time. We have been unable to contact the patient.  Signature Karena Addison, LPN [facility] Nurse Health Advisor Direct Dial [phone]

## 2022-09-15 ENCOUNTER — Emergency Department (HOSPITAL_COMMUNITY): Payer: Medicare Other

## 2022-09-15 ENCOUNTER — Emergency Department (HOSPITAL_COMMUNITY)
Admission: EM | Admit: 2022-09-15 | Discharge: 2022-09-16 | Disposition: A | Discharge status: Home / Self Care | Payer: Medicare Other | Attending: Emergency Medicine | Admitting: Emergency Medicine

## 2022-09-15 ENCOUNTER — Inpatient Hospital Stay
Admit: 2022-09-15 | Discharge: 2022-09-15 | Disposition: A | Discharge status: Home / Self Care | Payer: Medicare Other | Attending: Cardiology | Admitting: Cardiology

## 2022-09-15 DIAGNOSIS — I509 Heart failure, unspecified: Secondary | ICD-10-CM | POA: Insufficient documentation

## 2022-09-15 DIAGNOSIS — E875 Hyperkalemia: Secondary | ICD-10-CM | POA: Diagnosis not present

## 2022-09-15 DIAGNOSIS — I214 Non-ST elevation (NSTEMI) myocardial infarction: Secondary | ICD-10-CM | POA: Diagnosis not present

## 2022-09-15 DIAGNOSIS — Z7982 Long term (current) use of aspirin: Secondary | ICD-10-CM | POA: Insufficient documentation

## 2022-09-15 DIAGNOSIS — Z951 Presence of aortocoronary bypass graft: Secondary | ICD-10-CM | POA: Insufficient documentation

## 2022-09-15 DIAGNOSIS — Z7901 Long term (current) use of anticoagulants: Secondary | ICD-10-CM | POA: Diagnosis not present

## 2022-09-15 DIAGNOSIS — Z992 Dependence on renal dialysis: Secondary | ICD-10-CM | POA: Diagnosis not present

## 2022-09-15 DIAGNOSIS — Z79899 Other long term (current) drug therapy: Secondary | ICD-10-CM | POA: Diagnosis not present

## 2022-09-15 DIAGNOSIS — I132 Hypertensive heart and chronic kidney disease with heart failure and with stage 5 chronic kidney disease, or end stage renal disease: Secondary | ICD-10-CM | POA: Insufficient documentation

## 2022-09-15 DIAGNOSIS — N186 End stage renal disease: Secondary | ICD-10-CM | POA: Diagnosis not present

## 2022-09-15 DIAGNOSIS — I2510 Atherosclerotic heart disease of native coronary artery without angina pectoris: Secondary | ICD-10-CM | POA: Diagnosis not present

## 2022-09-15 DIAGNOSIS — R109 Unspecified abdominal pain: Secondary | ICD-10-CM | POA: Diagnosis present

## 2022-09-15 LAB — ECHOCARDIOGRAM COMPLETE
AR max vel: 2.07 cm2
AV Area VTI: 2.26 cm2
AV Area mean vel: 2.17 cm2
AV Mean grad: 6 mmHg
AV Peak grad: 14.5 mmHg
Ao pk vel: 1.91 m/s
Area-P 1/2: 2.69 cm2
Height: 62 in
MV VTI: 3.48 cm2
S' Lateral: 3.6 cm
Weight: 1883.61 oz

## 2022-09-15 LAB — POTASSIUM
Potassium: 4.4 mmol/L (ref 3.5–5.1)
Potassium: 4.5 mmol/L (ref 3.5–5.1)

## 2022-09-15 LAB — CBC WITH DIFFERENTIAL/PLATELET
Abs Immature Granulocytes: 0.04 10*3/uL (ref 0.00–0.07)
Basophils Absolute: 0 10*3/uL (ref 0.0–0.1)
Basophils Relative: 0 %
Eosinophils Absolute: 0.3 10*3/uL (ref 0.0–0.5)
Eosinophils Relative: 3 %
HCT: 33.4 % — ABNORMAL LOW (ref 39.0–52.0)
Hemoglobin: 10 g/dL — ABNORMAL LOW (ref 13.0–17.0)
Immature Granulocytes: 0 %
Lymphocytes Relative: 13 %
Lymphs Abs: 1.2 10*3/uL (ref 0.7–4.0)
MCH: 27.4 pg (ref 26.0–34.0)
MCHC: 29.9 g/dL — ABNORMAL LOW (ref 30.0–36.0)
MCV: 91.5 fL (ref 80.0–100.0)
Monocytes Absolute: 1.1 10*3/uL — ABNORMAL HIGH (ref 0.1–1.0)
Monocytes Relative: 12 %
Neutro Abs: 6.9 10*3/uL (ref 1.7–7.7)
Neutrophils Relative %: 72 %
Platelets: 325 10*3/uL (ref 150–400)
RBC: 3.65 MIL/uL — ABNORMAL LOW (ref 4.22–5.81)
RDW: 15.9 % — ABNORMAL HIGH (ref 11.5–15.5)
WBC: 9.5 10*3/uL (ref 4.0–10.5)
nRBC: 0 % (ref 0.0–0.2)

## 2022-09-15 LAB — LIPID PANEL
Cholesterol: 170 mg/dL (ref 0–200)
HDL: 44 mg/dL (ref >40)
LDL Cholesterol: 106 mg/dL — ABNORMAL HIGH (ref 0–99)
Total CHOL/HDL Ratio: 3.9 RATIO
Triglycerides: 99 mg/dL (ref <150)
VLDL: 20 mg/dL (ref 0–40)

## 2022-09-15 LAB — CBC
HCT: 33.3 % — ABNORMAL LOW (ref 39.0–52.0)
Hemoglobin: 10.3 g/dL — ABNORMAL LOW (ref 13.0–17.0)
MCH: 27.8 pg (ref 26.0–34.0)
MCHC: 30.9 g/dL (ref 30.0–36.0)
MCV: 90 fL (ref 80.0–100.0)
Platelets: 324 10*3/uL (ref 150–400)
RBC: 3.7 MIL/uL — ABNORMAL LOW (ref 4.22–5.81)
RDW: 15.8 % — ABNORMAL HIGH (ref 11.5–15.5)
WBC: 8.9 10*3/uL (ref 4.0–10.5)
nRBC: 0 % (ref 0.0–0.2)

## 2022-09-15 LAB — HEPATITIS B SURFACE ANTIBODY, QUANTITATIVE: Hep B S AB Quant (Post): 45.8 m[IU]/mL

## 2022-09-15 NOTE — ED Notes (Signed)
Pt updated to the wait, expected to be DC shortly once CM has seen pt.

## 2022-09-15 NOTE — Discharge Summary (Signed)
Physician Discharge Summary  Anthony Hess ZOX:[id number] DOB: 1956-02-05 DOA: 09/14/2022  PCP: Ronnald Ramp, MD  Admit date: 09/14/2022 Discharge date: 09/15/2022  Admitted From: home  Disposition:  home   Recommendations for Outpatient Follow-up:  Follow up with PCP in 1-2 weeks F/u w/ cardio, Dr. Ozella Almond, in 1-2 weeks F/u w/ nephro in 1-2 weeks    Home Health: no  Equipment/Devices:  Discharge Condition: stable  CODE STATUS: full  Diet recommendation: Heart Healthy/Renal   Brief/Interim Summary: HPI was taken from Dr. Alvester Morin: Anthony Hess is a 67 y.o. male with medical history significant of ESRD on hemodialysis TTS, hypertension, peripheral vascular use, coronary artery disease status post CABG, HFpEF, atrial fibrillation on Eliquis, history of bladder cancer presenting with NSTEMI, hyperkalemia, missed HD, abdominal pain.  Patient reports worsening shortness of breath and abdominal pain over the past week or so.  Baseline hemodialysis Tuesday Thursday Saturday.  Patient states he missed his Saturday dialysis.  Unclear as to exactly why.  Abdominal pain generalized.  No vomiting or diarrhea.  Positive mild nausea.  No true chest pain.  Positive shortness of breath.  1/2 pack/day smoker.  No reported cough or wheezing.  No hemiparesis or confusion.  Symptoms have progressively worsened over this timeframe. Presented to the ER afebrile, hemodynamically stable.  White count 15.3, hemoglobin 10.7, platelets 335, troponin 380-350.  EKG normal sinus rhythm.  Chest x-ray normal.  CT abdomen pelvis with some.  Perihepatic ascites as well as cholelithiasis.  Right upper quadrant ultrasound pending  Discharge Diagnoses:  Active Problems:   NSTEMI (non-ST elevated myocardial infarction) (HCC)   Hyperkalemia   (HFpEF) heart failure with preserved ejection fraction (HCC)   Abdominal pain   Atrial fibrillation, chronic (HCC)   Pulmonary embolism (HCC)   HLD (hyperlipidemia)    Leukocytosis   Cigarette smoker   Anemia in chronic kidney disease (CODE)   Coronary artery disease involving coronary bypass graft of native heart Demand ischemia: likely secondary to ESRD/decreased clearance as per cardio. NSTEMI r/o as per cardio. D/c heparin drip. F/u outpatient w/ cardio   Chronic diastolic CHF: fluid management w/ HD. Ef 55-60%, grade I diastolic dysfunction. Compensated    Hyperkalemia: resolved. Likely secondary to missed HD    Abdominal pain: no upper abd pain today. [place] abd showed distended gallbladder with layering stones. No wall thickening. No biliary ductal dilatation. No further sonographic evidence of acute cholecystitis. Echogenic liver lesions identified of uncertain etiology. Will need outpatient repeat imaging to reassess liver lesions. Pt was made aware & verbalized his understanding   Pulmonary embolism: continue on eliquis   Chronic a. fib: continue on eliquis   HLD: continue on statin    Leukocytosis: likely reactive. Resolved    Cigarette smoker: 1/2 pack a day smoker. Received smoking cessation counseling already. Nicotine patch to prevent w/drawal  Hx of CAD: s/p CABG in 2020. Continue on aspirin, statin    ACD: likely secondary to ESRD. No need for transfusion currently      Discharge Instructions  Discharge Instructions     Diet - low sodium heart healthy   Complete by: As directed    Discharge instructions   Complete by: As directed    F/u w/ PCP in 1-2 weeks. F/u w/ nephro in 1-2 weeks. F/u w/ cardio, Dr. Ozella Almond, in 1-2 weeks.   Increase activity slowly   Complete by: As directed    No wound care   Complete by: As directed  Allergies as of 09/15/2022   No Known Allergies      Medication List     TAKE these medications    amLODipine 2.5 MG tablet Commonly known as: NORVASC Take 1 tablet (2.5 mg total) by mouth daily.   apixaban 5 MG Tabs tablet Commonly known as: ELIQUIS Take 1 tablet (5 mg total) by mouth  2 (two) times daily.   aspirin EC 81 MG tablet Take 81 mg by mouth daily.   calcitRIOL 0.5 MCG capsule Commonly known as: ROCALTROL Take 1 capsule (0.5 mcg total) by mouth daily.   calcium acetate 667 MG capsule Commonly known as: PHOSLO Take 1 capsule (667 mg total) by mouth 3 (three) times daily with meals.   isosorbide dinitrate 10 MG tablet Commonly known as: ISORDIL Take 1 tablet (10 mg total) by mouth 3 (three) times daily.   metoprolol succinate 25 MG 24 hr tablet Commonly known as: TOPROL-XL Take 25 mg by mouth daily.   ondansetron 4 MG disintegrating tablet Commonly known as: ZOFRAN-ODT Take 1 tablet (4 mg total) by mouth every 8 (eight) hours as needed for nausea or vomiting.   pantoprazole 40 MG tablet Commonly known as: Protonix Take 1 tablet (40 mg total) by mouth daily.        Follow-up Information     [facility] Go in 1 week(s).   Why: [facility] cardiology 1-2 weeks Contact information: [address] [place] [address] [phone]                No Known Allergies  Consultations: Cardio Nephro    Procedures/Studies: US Abdomen Limited RUQ (LIVER/GB)  Result Date: 09/14/2022 CLINICAL DATA:  Right upper quadrant pain EXAM: ULTRASOUND ABDOMEN LIMITED RIGHT UPPER QUADRANT COMPARISON:  CT 09/14/2022 FINDINGS: Gallbladder: Distended gallbladder with layering stones. No wall thickening or adjacent fluid. Common bile duct: Diameter: 4 mm Liver: Heterogeneous parenchyma with some echogenic liver lesions identified such as right hepatic lobe superiorly measuring 2.1 x 1.3 x 3.0 cm. Smaller focus seen elsewhere measures 15 mm. Portal vein is patent on color Doppler imaging with normal direction of blood flow towards the liver. Other: Small amount of ascites. IMPRESSION: Distended gallbladder with layering stones. No wall thickening. No biliary ductal dilatation. No further sonographic evidence of acute cholecystitis. Echogenic  liver lesions identified of uncertain etiology. Please correlate for any known prior workup. Electronically Signed   By: Karen Kays M.D.   On: 09/14/2022 11:14   US Venous Img Lower Bilateral (DVT)  Result Date: 09/14/2022 CLINICAL DATA:  67 year old male with lower extremity pain EXAM: BILATERAL LOWER EXTREMITY VENOUS DOPPLER ULTRASOUND TECHNIQUE: Gray-scale sonography with graded compression, as well as color Doppler and duplex ultrasound were performed to evaluate the lower extremity deep venous systems from the level of the common femoral vein and including the common femoral, femoral, profunda femoral, popliteal and calf veins including the posterior tibial, peroneal and gastrocnemius veins when visible. The superficial great saphenous vein was also interrogated. Spectral Doppler was utilized to evaluate flow at rest and with distal augmentation maneuvers in the common femoral, femoral and popliteal veins. COMPARISON:  None Available. FINDINGS: RIGHT LOWER EXTREMITY Common Femoral Vein: No evidence of thrombus. Normal compressibility, respiratory phasicity and response to augmentation. Saphenofemoral Junction: No evidence of thrombus. Normal compressibility and flow on color Doppler imaging. Profunda Femoral Vein: No evidence of thrombus. Normal compressibility and flow on color Doppler imaging. Femoral Vein: No evidence of thrombus. Normal compressibility, respiratory phasicity and  response to augmentation. Popliteal Vein: No evidence of thrombus. Normal compressibility, respiratory phasicity and response to augmentation. Calf Veins: No evidence of thrombus. Normal compressibility and flow on color Doppler imaging. Superficial Great Saphenous Vein: No evidence of thrombus. Normal compressibility and flow on color Doppler imaging. Other Findings:  None. LEFT LOWER EXTREMITY Common Femoral Vein: No evidence of thrombus. Normal compressibility, respiratory phasicity and response to augmentation.  Saphenofemoral Junction: No evidence of thrombus. Normal compressibility and flow on color Doppler imaging. Profunda Femoral Vein: No evidence of thrombus. Normal compressibility and flow on color Doppler imaging. Femoral Vein: No evidence of thrombus. Normal compressibility, respiratory phasicity and response to augmentation. Popliteal Vein: No evidence of thrombus. Normal compressibility, respiratory phasicity and response to augmentation. Calf Veins: No evidence of thrombus. Normal compressibility and flow on color Doppler imaging. Superficial Great Saphenous Vein: No evidence of thrombus. Normal compressibility and flow on color Doppler imaging. Other Findings:  None. IMPRESSION: Directed duplex of the bilateral lower extremity negative for DVT Signed, Yvone Neu. Miachel Roux, RPVI [facility] Radiology Electronically Signed   By: Gilmer Mor D.O.   On: 09/14/2022 10:34   CT ABDOMEN PELVIS WO CONTRAST  Result Date: 09/14/2022 CLINICAL DATA:  67 year old male dialysis patient with shortness of breath. Periumbilical abdominal pain. EXAM: CT ABDOMEN AND PELVIS WITHOUT CONTRAST TECHNIQUE: Multidetector CT imaging of the abdomen and pelvis was performed following the standard protocol without IV contrast. RADIATION DOSE REDUCTION: This exam was performed according to the departmental dose-optimization program which includes automated exposure control, adjustment of the mA and/or kV according to patient size and/or use of iterative reconstruction technique. COMPARISON:  Portable chest x-ray 0404 hours today. CT Abdomen and Pelvis 712 24. FINDINGS: Lower chest: Mild respiratory motion and atelectasis. Trace layering left pleural effusion. No pericardial effusion. No confluent lung opacity. Hepatobiliary: Trace perihepatic free fluid is possible on series 2, image 17. But otherwise the noncontrast liver appears stable with multiple benign liver hemangiomas and  cysts better characterized on the contrast enhanced study earlier this month (no follow-up imaging recommended). Small layering gallstones are now evident on series 2, image 31. No convincing pericholecystic inflammation. Pancreas: Negative noncontrast appearance. Spleen: Negative. Adrenals/Urinary Tract: Stable adrenal glands and native renal atrophy. Decompressed urinary bladder. Stomach/Bowel: Similar fluid-filled but nondilated small bowel loops in much of the abdomen and pelvis. Normal appendix identified on coronal image 48. Similar large bowel retained stool. Stomach is mostly decompressed. No free air or discrete bowel inflammation identified in the absence of contrast. Vascular/Lymphatic: Severe calcified atherosclerosis. Extensive Aortoiliac calcified atherosclerosis. Stable mild tortuosity of the abdominal aorta. No lymphadenopathy identified. Reproductive: Negative. Other: No pelvis free fluid. Musculoskeletal: Scoliosis and mild lumbar spondylolisthesis. Previous right femur intramedullary rod removal. Stable visualized osseous structures. IMPRESSION: 1. Trace layering pleural fluid and perihepatic ascites, nonspecific. 2. Cholelithiasis. No noncontrast CT evidence of acute cholecystitis, but if there is right upper quadrant abdominal pain ultrasound would be valuable. 3. No other acute or inflammatory process identified in the noncontrast abdomen or pelvis. 4. Severe calcified atherosclerosis. Aortic Atherosclerosis (ICD10-I70.0). Electronically Signed   By: Odessa Fleming M.D.   On: 09/14/2022 05:44   DG Chest Portable 1 View  Result Date: 09/14/2022 CLINICAL DATA:  67 year old male dialysis patient with shortness of breath x1 hour. EXAM: PORTABLE CHEST 1 VIEW COMPARISON:  Portable chest 09/06/2022 and earlier. FINDINGS: Portable AP upright view at 0404 hours. Stable right chest dual lumen dialysis type catheter. Prior CABG. Stable cardiac  size and mediastinal contours. Visualized tracheal air column is  within normal limits. Stable lung volumes. No pneumothorax, pleural effusion or consolidation. Regressed streaky perihilar opacity and Kerley B-lines seen earlier this month. No areas of worsening ventilation. Dextroconvex thoracic scoliosis. No acute osseous abnormality identified. Negative visible bowel gas. IMPRESSION: No acute cardiopulmonary abnormality. Electronically Signed   By: Odessa Fleming M.D.   On: 09/14/2022 04:10   DG Chest Port 1 View  Result Date: 09/06/2022 CLINICAL DATA:  67 year old male with possible sepsis. Missed dialysis over the weekend. EXAM: PORTABLE CHEST 1 VIEW COMPARISON:  Chest radiograph 08/17/2022 and earlier. FINDINGS: Portable AP upright view at 0509 hours. Stable right chest dual lumen dialysis type catheter. Chronic sternotomy, CABG. Dextroconvex scoliosis. Patchy and indistinct acute left perihilar and mid lung opacity, but also evidence of Kerley B lines bilaterally. Stable mediastinal contours when allowing for portable technique. No pneumothorax. No air bronchograms. No pleural effusion is evident. Negative visible bowel gas. No acute osseous abnormality identified. IMPRESSION: 1. Acute new indistinct left perihilar opacity, but bilateral Kerley B lines also visible. Favor asymmetric acute pulmonary edema. Superimposed left lung aspiration or infection difficult to exclude. 2. Stable right chest dialysis catheter. Chronic sternotomy, CABG. Electronically Signed   By: Odessa Fleming M.D.   On: 09/06/2022 05:29   US Venous Img Lower Unilateral Right  Result Date: 09/03/2022 CLINICAL DATA:  67 year old male End stage renal disease patient with lower abdominal pain, left lower quadrant and right leg pain. Reportedly has a lower extremity DVT. EXAM: RIGHT LOWER EXTREMITY VENOUS DOPPLER ULTRASOUND TECHNIQUE: Gray-scale sonography with compression, as well as color and duplex ultrasound, were performed to evaluate the deep venous system(s) from the level of the common femoral vein  through the popliteal and proximal calf veins. A COMPARISON:  CT Abdomen and Pelvis today reported separately. Bilateral lower extremity venous Doppler 04/20/2022. FINDINGS: Discussion via PRA with the technologist at 0523 hours confirms that the right lower extremity was imaged with Doppler ultrasound, although was inadvertently labeled "LT" throughout the series 1 images. The technologist did send corrected labels for this exam on series 2 images. VENOUS Normal compressibility of the common femoral, superficial femoral, and popliteal veins, as well as the visualized calf veins. Visualized portions of profunda femoral vein and great saphenous vein unremarkable. No filling defects to suggest DVT on grayscale or color Doppler imaging. Doppler waveforms show normal direction of venous flow, normal respiratory plasticity and response to augmentation. Limited views of the contralateral common femoral vein are unremarkable. OTHER None. Limitations: none IMPRESSION: No evidence of right lower extremity deep venous thrombosis. Electronically Signed   By: Odessa Fleming M.D.   On: 09/03/2022 05:30   CT ABDOMEN PELVIS W CONTRAST  Result Date: 09/03/2022 CLINICAL DATA:  67 year old male End stage renal disease patient with lower abdominal pain, left lower quadrant and right leg pain. Reportedly has a lower extremity DVT. EXAM: CT ABDOMEN AND PELVIS WITH CONTRAST TECHNIQUE: Multidetector CT imaging of the abdomen and pelvis was performed using the standard protocol following bolus administration of intravenous contrast. RADIATION DOSE REDUCTION: This exam was performed according to the departmental dose-optimization program which includes automated exposure control, adjustment of the mA and/or kV according to patient size and/or use of iterative reconstruction technique. CONTRAST:  OMNIPAQUE IOHEXOL 300 MG/ML  SOLN COMPARISON:  CT Abdomen and Pelvis 08/10/2022. FINDINGS: Lower chest: Negative. Hepatobiliary: Stable liver  with multiple benign hemangiomas and cysts (no follow-up imaging recommended). More decompressed gallbladder today. Pancreas: Negative.  Spleen: Negative. Adrenals/Urinary Tract: Normal adrenal glands. Renal atrophy again noted. Decompressed bladder. Stomach/Bowel: Gas distended rectum. Retained stool in the upstream colon but no other dilated large or small bowel. Paucity of peritoneal fat. Decompressed stomach. No free air or free fluid. Evidence of a normal gas containing appendix coronal images 42 through 51. No discrete bowel inflammation is identified. Vascular/Lymphatic: Severe Aortoiliac calcified atherosclerosis. Tortuous abdominal aorta, no discrete aortic aneurysm. Visible major arterial structures remain patent. There is no venous contrast for evaluation, but the portal venous system appears to be patent. No lymphadenopathy identified. Reproductive: Negative. Other: No pelvis free fluid. Musculoskeletal: Moderate thoracolumbar scoliosis. Evidence of previous right femur intramedullary rod, chronically removed. No acute osseous abnormality identified. IMPRESSION: 1. No acute or inflammatory process identified in the abdomen or pelvis. 2. Normal appendix. Large bowel retained stool. Chronic renal atrophy. Advanced Aortic Atherosclerosis (ICD10-I70.0). Electronically Signed   By: Odessa Fleming M.D.   On: 09/03/2022 05:15   DG Chest 2 View  Result Date: 08/17/2022 CLINICAL DATA:  Chest pain, dialysis patient EXAM: CHEST - 2 VIEW COMPARISON:  08/10/2022 chest radiograph. FINDINGS: Right internal jugular dialysis catheter terminates over the right atrium. Intact sternotomy wires. Stable cardiomediastinal silhouette with normal heart size. No pneumothorax. No pleural effusion. Lungs appear clear, with no acute consolidative airspace disease and no pulmonary edema. IMPRESSION: No active cardiopulmonary disease. Electronically Signed   By: Delbert Phenix M.D.   On: 08/17/2022 09:04   (Echo, Carotid, EGD, Colonoscopy,  ERCP)    Subjective: Pt c/o fatigue. Pt denies chest pain or shortness of breath    Discharge Exam: Vitals:   09/15/22 1115 09/15/22 1347  BP:  (!) 148/95  Pulse: 73   Resp: 20 18  Temp:    SpO2: 98%    Vitals:   09/15/22 1015 09/15/22 1100 09/15/22 1115 09/15/22 1347  BP:    (!) 148/95  Pulse: 80 76 73   Resp: 17 19 20 18   Temp:      TempSrc:      SpO2: 97% 97% 98%   Weight:      Height:        General: Pt is alert, awake, not in acute distress Cardiovascular: S1/S2 +, no rubs, no gallops Respiratory: CTA bilaterally, no wheezing, no rhonchi Abdominal: Soft, NT, ND, bowel sounds + Extremities: no edema, no cyanosis    The results of significant diagnostics from this hospitalization (including imaging, microbiology, ancillary and laboratory) are listed below for reference.     Microbiology: Recent Results (from the past 240 hour(s))  SARS Coronavirus 2 by RT PCR (hospital order, performed in [facility] hospital lab) *cepheid single result test* Anterior Nasal Swab     Status: None   Collection Time: 09/06/22  4:43 AM   Specimen: Anterior Nasal Swab  Result Value Ref Range Status   SARS Coronavirus 2 by RT PCR NEGATIVE NEGATIVE Final    Comment: (NOTE) SARS-CoV-2 target nucleic acids are NOT DETECTED.  The SARS-CoV-2 RNA is generally detectable in upper and lower respiratory specimens during the acute phase of infection. The lowest concentration of SARS-CoV-2 viral copies this assay can detect is 250 copies / mL. A negative result does not preclude SARS-CoV-2 infection and should not be used as the sole basis for treatment or other patient management decisions.  A negative result may occur with improper specimen collection / handling, submission of specimen other than nasopharyngeal swab, presence of viral mutation(s) within the areas targeted by this assay, and  inadequate number of viral copies (<250 copies / mL). A negative result must be combined with  clinical observations, patient history, and epidemiological information.  Fact Sheet for Patients:   RoadLapTop.co.za  Fact Sheet for Healthcare Providers: http://kim-miller.com/  This test is not yet approved or  cleared by the [place] FDA and has been authorized for detection and/or diagnosis of SARS-CoV-2 by FDA under an Emergency Use Authorization (EUA).  This EUA will remain in effect (meaning this test can be used) for the duration of the COVID-19 declaration under Section 564(b)(1) of the Act, 21 U.S.C. section 360bbb-3(b)(1), unless the authorization is terminated or revoked sooner.  Performed at [facility], [address] [place], [place] [address]   SARS Coronavirus 2 by RT PCR (hospital order, performed in [facility] hospital lab) *cepheid single result test* Anterior Nasal Swab     Status: None   Collection Time: 09/14/22  3:58 AM   Specimen: Anterior Nasal Swab  Result Value Ref Range Status   SARS Coronavirus 2 by RT PCR NEGATIVE NEGATIVE Final    Comment: (NOTE) SARS-CoV-2 target nucleic acids are NOT DETECTED.  The SARS-CoV-2 RNA is generally detectable in upper and lower respiratory specimens during the acute phase of infection. The lowest concentration of SARS-CoV-2 viral copies this assay can detect is 250 copies / mL. A negative result does not preclude SARS-CoV-2 infection and should not be used as the sole basis for treatment or other patient management decisions.  A negative result may occur with improper specimen collection / handling, submission of specimen other than nasopharyngeal swab, presence of viral mutation(s) within the areas targeted by this assay, and inadequate number of viral copies (<250 copies / mL). A negative result must be combined with clinical observations, patient history, and epidemiological information.  Fact Sheet for Patients:    RoadLapTop.co.za  Fact Sheet for Healthcare Providers: http://kim-miller.com/  This test is not yet approved or  cleared by the [place] FDA and has been authorized for detection and/or diagnosis of SARS-CoV-2 by FDA under an Emergency Use Authorization (EUA).  This EUA will remain in effect (meaning this test can be used) for the duration of the COVID-19 declaration under Section 564(b)(1) of the Act, 21 U.S.C. section 360bbb-3(b)(1), unless the authorization is terminated or revoked sooner.  Performed at [facility], [address] [place], [place] [address]      Labs: BNP (last 3 results) Recent Labs    04/18/22 2237 09/06/22 0516  BNP 843.6* >4,500.0*   Basic Metabolic Panel: Recent Labs  Lab 09/14/22 0358 09/14/22 0810 09/14/22 1530 09/14/22 2201 09/15/22 0631 09/15/22 1034  NA 139 139  --   --   --   --   K 6.0* 4.5 3.3* 4.1 4.4 4.5  CL 107 106  --   --   --   --   CO2 18* 18*  --   --   --   --   GLUCOSE 96 62*  --   --   --   --   BUN 83* 84*  --   --   --   --   CREATININE 8.83* 8.73*  --   --   --   --   CALCIUM 6.6* 6.5*  --   --   --   --    Liver Function Tests: Recent Labs  Lab 09/14/22 0358  AST 28  ALT 34  ALKPHOS 59  BILITOT 0.3  PROT 7.0  ALBUMIN  3.6   No results for input(s): "LIPASE", "AMYLASE" in the last 168 hours. No results for input(s): "AMMONIA" in the last 168 hours. CBC: Recent Labs  Lab 09/14/22 0358 09/15/22 0631  WBC 15.3* 8.9  NEUTROABS 12.2*  --   HGB 10.7* 10.3*  HCT 34.5* 33.3*  MCV 90.8 90.0  PLT 335 324   Cardiac Enzymes: No results for input(s): "CKTOTAL", "CKMB", "CKMBINDEX", "TROPONINI" in the last 168 hours. BNP: Invalid input(s): "POCBNP" CBG: Recent Labs  Lab 09/14/22 0921  GLUCAP 155*   D-Dimer No results for input(s): "DDIMER" in the last 72 hours. Hgb A1c No results for input(s): "HGBA1C" in the last 72 hours. Lipid  Profile Recent Labs    09/15/22 0631  CHOL 170  HDL 44  LDLCALC 106*  TRIG 99  CHOLHDL 3.9   Thyroid function studies No results for input(s): "TSH", "T4TOTAL", "T3FREE", "THYROIDAB" in the last 72 hours.  Invalid input(s): "FREET3" Anemia work up No results for input(s): "VITAMINB12", "FOLATE", "FERRITIN", "TIBC", "IRON", "RETICCTPCT" in the last 72 hours. Urinalysis    Component Value Date/Time   COLORURINE YELLOW (A) 08/10/2022 1634   APPEARANCEUR TURBID (A) 08/10/2022 1634   APPEARANCEUR Cloudy (A) 05/08/2019 1448   LABSPEC 1.017 08/10/2022 1634   PHURINE 7.0 08/10/2022 1634   GLUCOSEU NEGATIVE 08/10/2022 1634   HGBUR SMALL (A) 08/10/2022 1634   BILIRUBINUR NEGATIVE 08/10/2022 1634   BILIRUBINUR Negative 05/08/2019 1448   KETONESUR NEGATIVE 08/10/2022 1634   PROTEINUR >=300 (A) 08/10/2022 1634   NITRITE NEGATIVE 08/10/2022 1634   LEUKOCYTESUR MODERATE (A) 08/10/2022 1634   Sepsis Labs Recent Labs  Lab 09/14/22 0358 09/15/22 0631  WBC 15.3* 8.9   Microbiology Recent Results (from the past 240 hour(s))  SARS Coronavirus 2 by RT PCR (hospital order, performed in [facility] hospital lab) *cepheid single result test* Anterior Nasal Swab     Status: None   Collection Time: 09/06/22  4:43 AM   Specimen: Anterior Nasal Swab  Result Value Ref Range Status   SARS Coronavirus 2 by RT PCR NEGATIVE NEGATIVE Final    Comment: (NOTE) SARS-CoV-2 target nucleic acids are NOT DETECTED.  The SARS-CoV-2 RNA is generally detectable in upper and lower respiratory specimens during the acute phase of infection. The lowest concentration of SARS-CoV-2 viral copies this assay can detect is 250 copies / mL. A negative result does not preclude SARS-CoV-2 infection and should not be used as the sole basis for treatment or other patient management decisions.  A negative result may occur with improper specimen collection / handling, submission of specimen other than nasopharyngeal swab,  presence of viral mutation(s) within the areas targeted by this assay, and inadequate number of viral copies (<250 copies / mL). A negative result must be combined with clinical observations, patient history, and epidemiological information.  Fact Sheet for Patients:   RoadLapTop.co.za  Fact Sheet for Healthcare Providers: http://kim-miller.com/  This test is not yet approved or  cleared by the [place] FDA and has been authorized for detection and/or diagnosis of SARS-CoV-2 by FDA under an Emergency Use Authorization (EUA).  This EUA will remain in effect (meaning this test can be used) for the duration of the COVID-19 declaration under Section 564(b)(1) of the Act, 21 U.S.C. section 360bbb-3(b)(1), unless the authorization is terminated or revoked sooner.  Performed at [facility], [address] [place], [place] [address]   SARS Coronavirus 2 by RT PCR (hospital order, performed in [facility] hospital lab) *cepheid single result  test* Anterior Nasal Swab     Status: None   Collection Time: 09/14/22  3:58 AM   Specimen: Anterior Nasal Swab  Result Value Ref Range Status   SARS Coronavirus 2 by RT PCR NEGATIVE NEGATIVE Final    Comment: (NOTE) SARS-CoV-2 target nucleic acids are NOT DETECTED.  The SARS-CoV-2 RNA is generally detectable in upper and lower respiratory specimens during the acute phase of infection. The lowest concentration of SARS-CoV-2 viral copies this assay can detect is 250 copies / mL. A negative result does not preclude SARS-CoV-2 infection and should not be used as the sole basis for treatment or other patient management decisions.  A negative result may occur with improper specimen collection / handling, submission of specimen other than nasopharyngeal swab, presence of viral mutation(s) within the areas targeted by this assay, and inadequate number of viral copies (<250 copies / mL). A  negative result must be combined with clinical observations, patient history, and epidemiological information.  Fact Sheet for Patients:   RoadLapTop.co.za  Fact Sheet for Healthcare Providers: http://kim-miller.com/  This test is not yet approved or  cleared by the [place] FDA and has been authorized for detection and/or diagnosis of SARS-CoV-2 by FDA under an Emergency Use Authorization (EUA).  This EUA will remain in effect (meaning this test can be used) for the duration of the COVID-19 declaration under Section 564(b)(1) of the Act, 21 U.S.C. section 360bbb-3(b)(1), unless the authorization is terminated or revoked sooner.  Performed at [facility], [address] [place], [place] [address]      Time coordinating discharge: Over 30 minutes  SIGNED:   Charise Killian, MD  [facility] 09/15/2022, 1:50 PM Pager   If 7PM-7AM, please contact night-coverage [email or web address]

## 2022-09-15 NOTE — Progress Notes (Signed)
*  PRELIMINARY RESULTS* Echocardiogram 2D Echocardiogram has been performed.  Carolyne Fiscal 09/15/2022, 1:15 PM

## 2022-09-15 NOTE — Progress Notes (Signed)
[facility]  ROUNDING NOTE   Subjective:   Anthony Hess is a 67 year old male with past medical conditions including hypertension, PVD, dyslipidemia, CAD, four-vessel CABG, and end-stage renal disease on hemodialysis. Patient presents to the emergency department with complaints of shortness of breath. Paietn has been admitted for Hyperkalemia [E87.5] SOB (shortness of breath) [R06.02] NSTEMI (non-ST elevated myocardial infarction) [facility]) [I21.4]   Patient is known to our practice and receives outpatient dialysis at [facility], followed by Dr Cherylann Ratel.   Patient seen sitting up in bed Alert and oriented Denies shortness of breath, room air No lower extremity edema  Denies chest pain  Objective:  Vital signs in last 24 hours:  Temp:  [97.3 F (36.3 C)-97.9 F (36.6 C)] 97.3 F (36.3 C) (07/23 1520) Pulse Rate:  [73-86] 80 (07/24 1015) Resp:  [11-22] 17 (07/24 1015) BP: (131-169)/(81-119) 131/92 (07/24 1000) SpO2:  [95 %-100 %] 97 % (07/24 1015) Weight:  [53.4 kg] 53.4 kg (07/23 1503)  Weight change:  Filed Weights   09/14/22 1040 09/14/22 1503  Weight: 55.7 kg 53.4 kg    Intake/Output: I/O last 3 completed shifts: In: -  Out: 2500 [Other:2500]   Intake/Output this shift:  Total I/O In: 240 [P.O.:240] Out: -   Physical Exam: General: NAD  Head: Normocephalic, atraumatic.   Eyes: Anicteric  Lungs:  Clear to auscultation, normal effort  Heart: Regular rate and rhythm  Abdomen:  Soft, nontender  Extremities:  No peripheral edema.  Neurologic: Alert and oriented, moving all four extremities  Skin: No lesions  Access: Rt chest permcath    Basic Metabolic Panel: Recent Labs  Lab 09/14/22 0358 09/14/22 0810 09/14/22 1530 09/14/22 2201 09/15/22 0631 09/15/22 1034  NA 139 139  --   --   --   --   K 6.0* 4.5 3.3* 4.1 4.4 4.5  CL 107 106  --   --   --   --   CO2 18* 18*  --   --   --   --   GLUCOSE 96 62*  --   --   --   --   BUN 83* 84*  --   --    --   --   CREATININE 8.83* 8.73*  --   --   --   --   CALCIUM 6.6* 6.5*  --   --   --   --     Liver Function Tests: Recent Labs  Lab 09/14/22 0358  AST 28  ALT 34  ALKPHOS 59  BILITOT 0.3  PROT 7.0  ALBUMIN 3.6   No results for input(s): "LIPASE", "AMYLASE" in the last 168 hours.  No results for input(s): "AMMONIA" in the last 168 hours.  CBC: Recent Labs  Lab 09/14/22 0358 09/15/22 0631  WBC 15.3* 8.9  NEUTROABS 12.2*  --   HGB 10.7* 10.3*  HCT 34.5* 33.3*  MCV 90.8 90.0  PLT 335 324    Cardiac Enzymes: No results for input(s): "CKTOTAL", "CKMB", "CKMBINDEX", "TROPONINI" in the last 168 hours.  BNP: Invalid input(s): "POCBNP"  CBG: Recent Labs  Lab 09/14/22 0921  GLUCAP 155*    Microbiology: Results for orders placed or performed during the hospital encounter of 09/14/22  SARS Coronavirus 2 by RT PCR (hospital order, performed in [facility] hospital lab) *cepheid single result test* Anterior Nasal Swab     Status: None   Collection Time: 09/14/22  3:58 AM   Specimen: Anterior Nasal Swab  Result Value  Ref Range Status   SARS Coronavirus 2 by RT PCR NEGATIVE NEGATIVE Final    Comment: (NOTE) SARS-CoV-2 target nucleic acids are NOT DETECTED.  The SARS-CoV-2 RNA is generally detectable in upper and lower respiratory specimens during the acute phase of infection. The lowest concentration of SARS-CoV-2 viral copies this assay can detect is 250 copies / mL. A negative result does not preclude SARS-CoV-2 infection and should not be used as the sole basis for treatment or other patient management decisions.  A negative result may occur with improper specimen collection / handling, submission of specimen other than nasopharyngeal swab, presence of viral mutation(s) within the areas targeted by this assay, and inadequate number of viral copies (<250 copies / mL). A negative result must be combined with clinical observations, patient history, and  epidemiological information.  Fact Sheet for Patients:   RoadLapTop.co.za  Fact Sheet for Healthcare Providers: http://kim-miller.com/  This test is not yet approved or  cleared by the [place] FDA and has been authorized for detection and/or diagnosis of SARS-CoV-2 by FDA under an Emergency Use Authorization (EUA).  This EUA will remain in effect (meaning this test can be used) for the duration of the COVID-19 declaration under Section 564(b)(1) of the Act, 21 U.S.C. section 360bbb-3(b)(1), unless the authorization is terminated or revoked sooner.  Performed at [facility], [address] [place], [place] [address]     Coagulation Studies: Recent Labs    09/14/22 0542  LABPROT 14.4  INR 1.1    Urinalysis: No results for input(s): "COLORURINE", "LABSPEC", "PHURINE", "GLUCOSEU", "HGBUR", "BILIRUBINUR", "KETONESUR", "PROTEINUR", "UROBILINOGEN", "NITRITE", "LEUKOCYTESUR" in the last 72 hours.  Invalid input(s): "APPERANCEUR"    Imaging: US Abdomen Limited RUQ (LIVER/GB)  Result Date: 09/14/2022 CLINICAL DATA:  Right upper quadrant pain EXAM: ULTRASOUND ABDOMEN LIMITED RIGHT UPPER QUADRANT COMPARISON:  CT 09/14/2022 FINDINGS: Gallbladder: Distended gallbladder with layering stones. No wall thickening or adjacent fluid. Common bile duct: Diameter: 4 mm Liver: Heterogeneous parenchyma with some echogenic liver lesions identified such as right hepatic lobe superiorly measuring 2.1 x 1.3 x 3.0 cm. Smaller focus seen elsewhere measures 15 mm. Portal vein is patent on color Doppler imaging with normal direction of blood flow towards the liver. Other: Small amount of ascites. IMPRESSION: Distended gallbladder with layering stones. No wall thickening. No biliary ductal dilatation. No further sonographic evidence of acute cholecystitis. Echogenic liver lesions identified of uncertain etiology. Please correlate for any known prior  workup. Electronically Signed   By: Karen Kays M.D.   On: 09/14/2022 11:14   US Venous Img Lower Bilateral (DVT)  Result Date: 09/14/2022 CLINICAL DATA:  67 year old male with lower extremity pain EXAM: BILATERAL LOWER EXTREMITY VENOUS DOPPLER ULTRASOUND TECHNIQUE: Gray-scale sonography with graded compression, as well as color Doppler and duplex ultrasound were performed to evaluate the lower extremity deep venous systems from the level of the common femoral vein and including the common femoral, femoral, profunda femoral, popliteal and calf veins including the posterior tibial, peroneal and gastrocnemius veins when visible. The superficial great saphenous vein was also interrogated. Spectral Doppler was utilized to evaluate flow at rest and with distal augmentation maneuvers in the common femoral, femoral and popliteal veins. COMPARISON:  None Available. FINDINGS: RIGHT LOWER EXTREMITY Common Femoral Vein: No evidence of thrombus. Normal compressibility, respiratory phasicity and response to augmentation. Saphenofemoral Junction: No evidence of thrombus. Normal compressibility and flow on color Doppler imaging. Profunda Femoral Vein: No evidence of thrombus. Normal compressibility and flow on color Doppler imaging.  Femoral Vein: No evidence of thrombus. Normal compressibility, respiratory phasicity and response to augmentation. Popliteal Vein: No evidence of thrombus. Normal compressibility, respiratory phasicity and response to augmentation. Calf Veins: No evidence of thrombus. Normal compressibility and flow on color Doppler imaging. Superficial Great Saphenous Vein: No evidence of thrombus. Normal compressibility and flow on color Doppler imaging. Other Findings:  None. LEFT LOWER EXTREMITY Common Femoral Vein: No evidence of thrombus. Normal compressibility, respiratory phasicity and response to augmentation. Saphenofemoral Junction: No evidence of thrombus. Normal compressibility and flow on color  Doppler imaging. Profunda Femoral Vein: No evidence of thrombus. Normal compressibility and flow on color Doppler imaging. Femoral Vein: No evidence of thrombus. Normal compressibility, respiratory phasicity and response to augmentation. Popliteal Vein: No evidence of thrombus. Normal compressibility, respiratory phasicity and response to augmentation. Calf Veins: No evidence of thrombus. Normal compressibility and flow on color Doppler imaging. Superficial Great Saphenous Vein: No evidence of thrombus. Normal compressibility and flow on color Doppler imaging. Other Findings:  None. IMPRESSION: Directed duplex of the bilateral lower extremity negative for DVT Signed, Yvone Neu. Miachel Roux, RPVI [facility] Electronically Signed   By: Gilmer Mor D.O.   On: 09/14/2022 10:34   CT ABDOMEN PELVIS WO CONTRAST  Result Date: 09/14/2022 CLINICAL DATA:  67 year old male dialysis patient with shortness of breath. Periumbilical abdominal pain. EXAM: CT ABDOMEN AND PELVIS WITHOUT CONTRAST TECHNIQUE: Multidetector CT imaging of the abdomen and pelvis was performed following the standard protocol without IV contrast. RADIATION DOSE REDUCTION: This exam was performed according to the departmental dose-optimization program which includes automated exposure control, adjustment of the mA and/or kV according to patient size and/or use of iterative reconstruction technique. COMPARISON:  Portable chest x-ray 0404 hours today. CT Abdomen and Pelvis 712 24. FINDINGS: Lower chest: Mild respiratory motion and atelectasis. Trace layering left pleural effusion. No pericardial effusion. No confluent lung opacity. Hepatobiliary: Trace perihepatic free fluid is possible on series 2, image 17. But otherwise the noncontrast liver appears stable with multiple benign liver hemangiomas and cysts better characterized on the contrast enhanced study earlier this month (no follow-up  imaging recommended). Small layering gallstones are now evident on series 2, image 31. No convincing pericholecystic inflammation. Pancreas: Negative noncontrast appearance. Spleen: Negative. Adrenals/Urinary Tract: Stable adrenal glands and native renal atrophy. Decompressed urinary bladder. Stomach/Bowel: Similar fluid-filled but nondilated small bowel loops in much of the abdomen and pelvis. Normal appendix identified on coronal image 48. Similar large bowel retained stool. Stomach is mostly decompressed. No free air or discrete bowel inflammation identified in the absence of contrast. Vascular/Lymphatic: Severe calcified atherosclerosis. Extensive Aortoiliac calcified atherosclerosis. Stable mild tortuosity of the abdominal aorta. No lymphadenopathy identified. Reproductive: Negative. Other: No pelvis free fluid. Musculoskeletal: Scoliosis and mild lumbar spondylolisthesis. Previous right femur intramedullary rod removal. Stable visualized osseous structures. IMPRESSION: 1. Trace layering pleural fluid and perihepatic ascites, nonspecific. 2. Cholelithiasis. No noncontrast CT evidence of acute cholecystitis, but if there is right upper quadrant abdominal pain ultrasound would be valuable. 3. No other acute or inflammatory process identified in the noncontrast abdomen or pelvis. 4. Severe calcified atherosclerosis. Aortic Atherosclerosis (ICD10-I70.0). Electronically Signed   By: Odessa Fleming M.D.   On: 09/14/2022 05:44   DG Chest Portable 1 View  Result Date: 09/14/2022 CLINICAL DATA:  67 year old male dialysis patient with shortness of breath x1 hour. EXAM: PORTABLE CHEST 1 VIEW COMPARISON:  Portable chest 09/06/2022 and earlier. FINDINGS: Portable AP upright view at 0404 hours. Stable  right chest dual lumen dialysis type catheter. Prior CABG. Stable cardiac size and mediastinal contours. Visualized tracheal air column is within normal limits. Stable lung volumes. No pneumothorax, pleural effusion or  consolidation. Regressed streaky perihilar opacity and Kerley B-lines seen earlier this month. No areas of worsening ventilation. Dextroconvex thoracic scoliosis. No acute osseous abnormality identified. Negative visible bowel gas. IMPRESSION: No acute cardiopulmonary abnormality. Electronically Signed   By: Odessa Fleming M.D.   On: 09/14/2022 04:10     Medications:    anticoagulant sodium citrate       apixaban  5 mg Oral BID   aspirin  81 mg Oral Daily   Chlorhexidine Gluconate Cloth  6 each Topical Q0600   metoprolol succinate  25 mg Oral Daily   nicotine  14 mg Transdermal Daily   pantoprazole (PROTONIX) IV  40 mg Intravenous Q12H   acetaminophen, alteplase, anticoagulant sodium citrate, heparin, hydrALAZINE, lidocaine (PF), lidocaine-prilocaine, ondansetron (ZOFRAN) IV, pentafluoroprop-tetrafluoroeth  Assessment/ Plan:  [name] is a 67 y.o.  male with past medical conditions including hypertension, PVD, dyslipidemia, CAD, four-vessel CABG, and end-stage renal disease on hemodialysis. Patient presents to the emergency department with complaints of shortness of breath. He will be admitted for Hyperkalemia [E87.5] SOB (shortness of breath) [R06.02] NSTEMI (non-ST elevated myocardial infarction) (HCC) [I21.4]   End stage renal disease with hyperkalemia on hemodialysis.  Last treatment received on Saturday. Potassium 6.0, given Lokelma and insulin in ED. Dialysis received yesterday with UF 2.5 L achieved.  Potassium corrected with treatment.  Next treatment scheduled for Thursday.  Patient cleared to discharge from renal stance and resume outpatient treatments at a site clinic.  2. Anemia of chronic kidney disease Lab Results  Component Value Date   HGB 10.3 (L) 09/15/2022    Hgb within desired target.  No need for ESA's. 4. Secondary Hyperparathyroidism: with outpatient labs: None available    Lab Results  Component Value Date   PTH 170 (H) 06/25/2022   CALCIUM 6.5 (L)  09/14/2022   CAION 0.84 (LL) 09/11/2021   PHOS 6.0 (H) 09/08/2022    Continue calcitriol and calcium acetate with meals as prescribed outpatient.    5. Hypertension with chronic kidney disease. Home regimen includes amlodipine, hydralazine, isosorbide, and metoprolol.  Blood pressure slightly elevated today, 156/100.  Receiving metoprolol only.  6. NSTEMI with troponin 380, peaked to 437. EKG stable, heparin drip in place. Cardiology consulted and feel this is demand ischemia.   LOS: 1 Fatisha Rabalais 7/24/202411:42 AM

## 2022-09-15 NOTE — Progress Notes (Signed)
*  PRELIMINARY RESULTS* Echocardiogram 2D Echocardiogram has been performed.  Anthony Hess 09/15/2022, 1:13 PM

## 2022-09-15 NOTE — ED Provider Notes (Signed)
[facility] EMERGENCY DEPARTMENT AT [facility] Provider Note   CSN: [id number] Arrival date & time: 09/15/22  2219     History {Add pertinent medical, surgical, social history, OB history to HPI:1} Chief Complaint  Patient presents with   Shortness of Breath   Abdominal Pain    Anthony Hess is a 67 y.o. male.  The history is provided by the patient and medical records.  Shortness of Breath Associated symptoms: abdominal pain   Abdominal Pain Associated symptoms: shortness of breath    67 year old male with history of ESRD on HD, congestive heart failure, hyperlipidemia, coronary artery disease status post CABG x 4, PVD, DVT on Eliquis, presenting to the ED with multiple complaints.  Of note, patient admitted to the hospital yesterday evening and discharged this morning for similar.  He reports he has had upset stomach for the past 2 weeks, notably having a lot of vomiting.  States he is able to eat and drink in small amounts but will have sporadic vomiting.  He is not having any diarrhea.  No fever/chills.  He did have full session of HD yesterday, states he still feels SOB.  States some "mild" chest pain.  Denies diaphoresis, arm/neck pain.  Also has some right calf pain.  Known hx DVT in this leg a few months ago, compliant with this eliquis.  Home Medications Prior to Admission medications   Medication Sig Start Date End Date Taking? Authorizing Provider  amLODipine (NORVASC) 2.5 MG tablet Take 1 tablet (2.5 mg total) by mouth daily. 05/07/22   Alford Highland, MD  apixaban (ELIQUIS) 5 MG TABS tablet Take 1 tablet (5 mg total) by mouth 2 (two) times daily. 05/01/22   Baron Hamper, MD  aspirin EC 81 MG tablet Take 81 mg by mouth daily. 05/06/20   [provider]  calcitRIOL (ROCALTROL) 0.5 MCG capsule Take 1 capsule (0.5 mcg total) by mouth daily. 11/18/21   Sunnie Nielsen, DO  calcium acetate (PHOSLO) 667 MG capsule Take 1 capsule (667 mg total) by mouth 3  (three) times daily with meals. 11/18/21   Sunnie Nielsen, DO  isosorbide dinitrate (ISORDIL) 10 MG tablet Take 1 tablet (10 mg total) by mouth 3 (three) times daily. 04/21/22   Lurene Shadow, MD  metoprolol succinate (TOPROL-XL) 25 MG 24 hr tablet Take 25 mg by mouth daily. 03/12/22   [provider]  ondansetron (ZOFRAN-ODT) 4 MG disintegrating tablet Take 1 tablet (4 mg total) by mouth every 8 (eight) hours as needed for nausea or vomiting. 06/25/22   Simmons-Robinson, Makiera, MD  pantoprazole (PROTONIX) 40 MG tablet Take 1 tablet (40 mg total) by mouth daily. 05/07/22 06/06/22  Alford Highland, MD      Allergies    Patient has no known allergies.    Review of Systems   Review of Systems  Respiratory:  Positive for shortness of breath.   Gastrointestinal:  Positive for abdominal pain.    Physical Exam Updated Vital Signs BP (!) 149/125 (BP Location: Left Arm)   Pulse 77   Temp 98.5 F (36.9 C) (Oral)   Resp 16   Ht 5\' 2"  (1.575 m)   Wt 60.8 kg   SpO2 100%   BMI 24.51 kg/m   Physical Exam Vitals and nursing note reviewed.  Constitutional:      Appearance: He is well-developed.  HENT:     Head: Normocephalic and atraumatic.  Eyes:     Conjunctiva/sclera: Conjunctivae normal.     Pupils: Pupils are  equal, round, and reactive to light.  Cardiovascular:     Rate and Rhythm: Normal rate and regular rhythm.     Heart sounds: Normal heart sounds.  Pulmonary:     Effort: Pulmonary effort is normal.     Breath sounds: Normal breath sounds. No wheezing or rhonchi.  Chest:     Comments: Dialysis access right subclavian, clean without signs of infection Abdominal:     General: Bowel sounds are normal.     Palpations: Abdomen is soft.     Tenderness: There is no abdominal tenderness. There is no guarding or rebound.     Comments: Well healed midline surgical incision, no distention noted  Musculoskeletal:        General: Normal range of motion.     Cervical back:  Normal range of motion.     Comments: Leg without calf asymmetry or palpable cords, no overlying erythema/induration, DP pulse intact  Skin:    General: Skin is warm and dry.  Neurological:     Mental Status: He is alert and oriented to person, place, and time.     ED Results / Procedures / Treatments   Labs (all labs ordered are listed, but only abnormal results are displayed) Labs Reviewed  CBC WITH DIFFERENTIAL/PLATELET  COMPREHENSIVE METABOLIC PANEL  LIPASE, BLOOD  TROPONIN I (HIGH SENSITIVITY)    EKG None  Radiology ECHOCARDIOGRAM COMPLETE  Result Date: 09/15/2022    ECHOCARDIOGRAM REPORT   Patient Name:   Anthony Hess Date of Exam: 09/15/2022 Medical Rec #:  [id number]    Height:       62.0 in Accession #:    [id number]   Weight:       117.7 lb Date of Birth:  [birth year]   BSA:          1.526 m Patient Age:    66 years     BP:           146/106 mmHg Patient Gender: M            HR:           75 bpm. Exam Location:  [facility] Procedure: 2D Echo, Cardiac Doppler and Color Doppler Indications:     Chest pain  History:         Patient has prior history of Echocardiogram examinations, most                  recent 04/19/2022. CHF, CAD, Acute MI and Previous Myocardial                  Infarction, Prior CABG, Arrythmias:Atrial Fibrillation,                  Signs/Symptoms:Chest Pain; Risk Factors:Hypertension,                  Dyslipidemia and Current Smoker. AAA, Pulmonary embolus, ESRD                  on dialysis.  Sonographer:     Mikki Harbor Referring Phys:  [id number] LILY MICHELLE TANG Diagnosing Phys: Alwyn Pea MD IMPRESSIONS  1. Left ventricular ejection fraction, by estimation, is 55 to 60%. The left ventricle has normal function. The left ventricle has no regional wall motion abnormalities. The left ventricular internal cavity size was mildly dilated. Left ventricular diastolic parameters are consistent with Grade I diastolic dysfunction (impaired relaxation).  2. Right  ventricular systolic function is normal. The right ventricular size is normal.  There is normal pulmonary artery systolic pressure.  3. The mitral valve is grossly normal. Trivial mitral valve regurgitation.  4. The aortic valve is abnormal. Aortic valve regurgitation is not visualized. Aortic valve sclerosis/calcification is present, without any evidence of aortic stenosis. FINDINGS  Left Ventricle: Left ventricular ejection fraction, by estimation, is 55 to 60%. The left ventricle has normal function. The left ventricle has no regional wall motion abnormalities. The left ventricular internal cavity size was mildly dilated. There is  borderline concentric left ventricular hypertrophy. Left ventricular diastolic parameters are consistent with Grade I diastolic dysfunction (impaired relaxation). Right Ventricle: The right ventricular size is normal. No increase in right ventricular wall thickness. Right ventricular systolic function is normal. There is normal pulmonary artery systolic pressure. The tricuspid regurgitant velocity is 2.52 m/s, and  with an assumed right atrial pressure of 3 mmHg, the estimated right ventricular systolic pressure is 28.4 mmHg. Left Atrium: Left atrial size was normal in size. Right Atrium: Right atrial size was normal in size. Pericardium: There is no evidence of pericardial effusion. Mitral Valve: The mitral valve is grossly normal. There is mild thickening of the mitral valve leaflet(s). There is mild calcification of the mitral valve leaflet(s). Mildly decreased mobility of the mitral valve leaflets. Trivial mitral valve regurgitation. MV peak gradient, 4.2 mmHg. The mean mitral valve gradient is 1.0 mmHg. Tricuspid Valve: The tricuspid valve is normal in structure. Tricuspid valve regurgitation is trivial. Aortic Valve: The aortic valve is abnormal. Aortic valve regurgitation is not visualized. Aortic valve sclerosis/calcification is present, without any evidence of aortic stenosis.  Aortic valve mean gradient measures 6.0 mmHg. Aortic valve peak gradient measures 14.5 mmHg. Aortic valve area, by VTI measures 2.26 cm. Pulmonic Valve: The pulmonic valve was normal in structure. Pulmonic valve regurgitation is not visualized. Aorta: The ascending aorta was not well visualized. IAS/Shunts: No atrial level shunt detected by color flow Doppler.  LEFT VENTRICLE PLAX 2D LVIDd:         5.10 cm   Diastology LVIDs:         3.60 cm   LV e' medial:    5.77 cm/s LV PW:         1.10 cm   LV E/e' medial:  10.1 LV IVS:        1.40 cm   LV e' lateral:   9.36 cm/s LVOT diam:     1.90 cm   LV E/e' lateral: 6.2 LV SV:         69 LV SV Index:   45 LVOT Area:     2.84 cm  RIGHT VENTRICLE RV Basal diam:  3.20 cm RV Mid diam:    3.40 cm RV S prime:     7.07 cm/s LEFT ATRIUM             Index        RIGHT ATRIUM           Index LA diam:        4.10 cm 2.69 cm/m   RA Area:     14.90 cm LA Vol (A2C):   59.7 ml 39.12 ml/m  RA Volume:   35.30 ml  23.13 ml/m LA Vol (A4C):   47.3 ml 30.99 ml/m LA Biplane Vol: 54.5 ml 35.71 ml/m  AORTIC VALVE                     PULMONIC VALVE AV Area (Vmax):    2.07 cm  PV Vmax:       1.28 m/s AV Area (Vmean):   2.17 cm      PV Peak grad:  6.6 mmHg AV Area (VTI):     2.26 cm AV Vmax:           190.50 cm/s AV Vmean:          105.450 cm/s AV VTI:            0.303 m AV Peak Grad:      14.5 mmHg AV Mean Grad:      6.0 mmHg LVOT Vmax:         139.00 cm/s LVOT Vmean:        80.700 cm/s LVOT VTI:          0.242 m LVOT/AV VTI ratio: 0.80  AORTA Ao Root diam: 3.50 cm MITRAL VALVE               TRICUSPID VALVE MV Area (PHT): 2.69 cm    TR Peak grad:   25.4 mmHg MV Area VTI:   3.48 cm    TR Vmax:        252.00 cm/s MV Peak grad:  4.2 mmHg MV Mean grad:  1.0 mmHg    SHUNTS MV Vmax:       1.02 m/s    Systemic VTI:  0.24 m MV Vmean:      52.5 cm/s   Systemic Diam: 1.90 cm MV Decel Time: 282 msec MV E velocity: 58.20 cm/s MV A velocity: 98.00 cm/s MV E/A ratio:  0.59 Dwayne D Callwood MD  Electronically signed by Alwyn Pea MD Signature Date/Time: 09/15/2022/4:58:54 PM    Final    US Abdomen Limited RUQ (LIVER/GB)  Result Date: 09/14/2022 CLINICAL DATA:  Right upper quadrant pain EXAM: ULTRASOUND ABDOMEN LIMITED RIGHT UPPER QUADRANT COMPARISON:  CT 09/14/2022 FINDINGS: Gallbladder: Distended gallbladder with layering stones. No wall thickening or adjacent fluid. Common bile duct: Diameter: 4 mm Liver: Heterogeneous parenchyma with some echogenic liver lesions identified such as right hepatic lobe superiorly measuring 2.1 x 1.3 x 3.0 cm. Smaller focus seen elsewhere measures 15 mm. Portal vein is patent on color Doppler imaging with normal direction of blood flow towards the liver. Other: Small amount of ascites. IMPRESSION: Distended gallbladder with layering stones. No wall thickening. No biliary ductal dilatation. No further sonographic evidence of acute cholecystitis. Echogenic liver lesions identified of uncertain etiology. Please correlate for any known prior workup. Electronically Signed   By: Karen Kays M.D.   On: 09/14/2022 11:14   US Venous Img Lower Bilateral (DVT)  Result Date: 09/14/2022 CLINICAL DATA:  67 year old male with lower extremity pain EXAM: BILATERAL LOWER EXTREMITY VENOUS DOPPLER ULTRASOUND TECHNIQUE: Gray-scale sonography with graded compression, as well as color Doppler and duplex ultrasound were performed to evaluate the lower extremity deep venous systems from the level of the common femoral vein and including the common femoral, femoral, profunda femoral, popliteal and calf veins including the posterior tibial, peroneal and gastrocnemius veins when visible. The superficial great saphenous vein was also interrogated. Spectral Doppler was utilized to evaluate flow at rest and with distal augmentation maneuvers in the common femoral, femoral and popliteal veins. COMPARISON:  None Available. FINDINGS: RIGHT LOWER EXTREMITY Common Femoral Vein: No evidence of  thrombus. Normal compressibility, respiratory phasicity and response to augmentation. Saphenofemoral Junction: No evidence of thrombus. Normal compressibility and flow on color Doppler imaging. Profunda Femoral Vein: No evidence of thrombus. Normal compressibility and flow on color  Doppler imaging. Femoral Vein: No evidence of thrombus. Normal compressibility, respiratory phasicity and response to augmentation. Popliteal Vein: No evidence of thrombus. Normal compressibility, respiratory phasicity and response to augmentation. Calf Veins: No evidence of thrombus. Normal compressibility and flow on color Doppler imaging. Superficial Great Saphenous Vein: No evidence of thrombus. Normal compressibility and flow on color Doppler imaging. Other Findings:  None. LEFT LOWER EXTREMITY Common Femoral Vein: No evidence of thrombus. Normal compressibility, respiratory phasicity and response to augmentation. Saphenofemoral Junction: No evidence of thrombus. Normal compressibility and flow on color Doppler imaging. Profunda Femoral Vein: No evidence of thrombus. Normal compressibility and flow on color Doppler imaging. Femoral Vein: No evidence of thrombus. Normal compressibility, respiratory phasicity and response to augmentation. Popliteal Vein: No evidence of thrombus. Normal compressibility, respiratory phasicity and response to augmentation. Calf Veins: No evidence of thrombus. Normal compressibility and flow on color Doppler imaging. Superficial Great Saphenous Vein: No evidence of thrombus. Normal compressibility and flow on color Doppler imaging. Other Findings:  None. IMPRESSION: Directed duplex of the bilateral lower extremity negative for DVT Signed, Yvone Neu. Miachel Roux, RPVI [facility] Radiology Electronically Signed   By: Gilmer Mor D.O.   On: 09/14/2022 10:34   CT ABDOMEN PELVIS WO CONTRAST  Result Date: 09/14/2022 CLINICAL DATA:  67 year old male  dialysis patient with shortness of breath. Periumbilical abdominal pain. EXAM: CT ABDOMEN AND PELVIS WITHOUT CONTRAST TECHNIQUE: Multidetector CT imaging of the abdomen and pelvis was performed following the standard protocol without IV contrast. RADIATION DOSE REDUCTION: This exam was performed according to the departmental dose-optimization program which includes automated exposure control, adjustment of the mA and/or kV according to patient size and/or use of iterative reconstruction technique. COMPARISON:  Portable chest x-ray 0404 hours today. CT Abdomen and Pelvis 712 24. FINDINGS: Lower chest: Mild respiratory motion and atelectasis. Trace layering left pleural effusion. No pericardial effusion. No confluent lung opacity. Hepatobiliary: Trace perihepatic free fluid is possible on series 2, image 17. But otherwise the noncontrast liver appears stable with multiple benign liver hemangiomas and cysts better characterized on the contrast enhanced study earlier this month (no follow-up imaging recommended). Small layering gallstones are now evident on series 2, image 31. No convincing pericholecystic inflammation. Pancreas: Negative noncontrast appearance. Spleen: Negative. Adrenals/Urinary Tract: Stable adrenal glands and native renal atrophy. Decompressed urinary bladder. Stomach/Bowel: Similar fluid-filled but nondilated small bowel loops in much of the abdomen and pelvis. Normal appendix identified on coronal image 48. Similar large bowel retained stool. Stomach is mostly decompressed. No free air or discrete bowel inflammation identified in the absence of contrast. Vascular/Lymphatic: Severe calcified atherosclerosis. Extensive Aortoiliac calcified atherosclerosis. Stable mild tortuosity of the abdominal aorta. No lymphadenopathy identified. Reproductive: Negative. Other: No pelvis free fluid. Musculoskeletal: Scoliosis and mild lumbar spondylolisthesis. Previous right femur intramedullary rod removal. Stable  visualized osseous structures. IMPRESSION: 1. Trace layering pleural fluid and perihepatic ascites, nonspecific. 2. Cholelithiasis. No noncontrast CT evidence of acute cholecystitis, but if there is right upper quadrant abdominal pain ultrasound would be valuable. 3. No other acute or inflammatory process identified in the noncontrast abdomen or pelvis. 4. Severe calcified atherosclerosis. Aortic Atherosclerosis (ICD10-I70.0). Electronically Signed   By: Odessa Fleming M.D.   On: 09/14/2022 05:44   DG Chest Portable 1 View  Result Date: 09/14/2022 CLINICAL DATA:  67 year old male dialysis patient with shortness of breath x1 hour. EXAM: PORTABLE CHEST 1 VIEW COMPARISON:  Portable chest 09/06/2022 and earlier. FINDINGS: Portable AP upright view at 0404  hours. Stable right chest dual lumen dialysis type catheter. Prior CABG. Stable cardiac size and mediastinal contours. Visualized tracheal air column is within normal limits. Stable lung volumes. No pneumothorax, pleural effusion or consolidation. Regressed streaky perihilar opacity and Kerley B-lines seen earlier this month. No areas of worsening ventilation. Dextroconvex thoracic scoliosis. No acute osseous abnormality identified. Negative visible bowel gas. IMPRESSION: No acute cardiopulmonary abnormality. Electronically Signed   By: Odessa Fleming M.D.   On: 09/14/2022 04:10    Procedures Procedures  {Document cardiac monitor, telemetry assessment procedure when appropriate:1}  Medications Ordered in ED Medications - No data to display  ED Course/ Medical Decision Making/ A&P   {   Click here for ABCD2, HEART and other calculatorsREFRESH Note before signing :1}                          Medical Decision Making Amount and/or Complexity of Data Reviewed Labs: ordered. Radiology: ordered.   67 y.o. M here with SOB, abdominal pain, and right leg pain.  Admitted to [facility] yesterday for same and discharged this AM.  He did have full work-up to include CTAP, RUQ [place],  echocardiogram, DVT studies of the legs, etc.  He was evaluated by cardiology due to worsening trops and hx of CAD, felt to be related to his ESRD.  He states he does not feel any better.  He is afebrile, non-toxic in appearance.  Dialysis access is C/D/I.  Abdomen soft, no distention noted on exam.  Leg is NVI without erythema, induration, or palpable cords. Final Clinical Impression(s) / ED Diagnoses Final diagnoses:  None    Rx / DC Orders ED Discharge Orders     None

## 2022-09-15 NOTE — TOC CM/SW Note (Signed)
Transition of Care [facility]) - Inpatient Brief Assessment   Patient Details  Name: Anthony Hess MRN: [id number] Date of Birth: 07/26/1955  [facility] CM/SW Contact:    Kreg Shropshire, RN Phone Number: 09/15/2022, 9:09 AM   Clinical Narrative:  Cm completed readmission prevention assessment. Pt lives with wife and she provides transportation. Does not use any DME nor HH sevices. Pharmacy at [facility] and PCP is Dr. [name]. Pt stated he will like resources regarding housing. Cm gave resources in AVS. Cm continue to follow  Transition of Care Asessment: Insurance and Status: Insurance coverage has been reviewed Patient has primary care physician: Yes Home environment has been reviewed: Independent and lives with wife   Prior/Current Home Services: No current home services Social Determinants of Health Reivew: SDOH reviewed no interventions necessary Readmission risk has been reviewed: Yes Transition of care needs: no transition of care needs at this time

## 2022-09-15 NOTE — ED Notes (Signed)
Pt given 8 oz cup of water.

## 2022-09-15 NOTE — Care Management Obs Status (Signed)
MEDICARE OBSERVATION STATUS NOTIFICATION   Patient Details  Name: Anthony Hess MRN: [id number] Date of Birth: September 17, 1955   Medicare Observation Status Notification Given:  Yes    Kreg Shropshire, RN 09/15/2022, 2:58 PM

## 2022-09-15 NOTE — Discharge Instructions (Signed)
Rent/Utility/Housing  Agency Name: [facility] Address: [address] [place], [place] [address] Phone: [phone] Email: [email or web address] Website: [email or web address] Service(s) Offered: Housing services, self-sufficiency, congregate meal program, weatherization program, heating appliance  repair/replacement program, emergency food assistance,  housing counseling, home ownership program, wheels -towork program.  Agency Name: [facility] Address: [address], [place], [place] [address] Phone: [phone] (8a-4p) [phone] (8p- 10p) Email: [email or web address] Website: [email or web address] Service(s) Offered: A program for homeless and/or needy men that includes one-on-one counseling, life skills training and job rehabilitation.  Agency Name: [facility] Address: [address], [place], [place] [address] Phone: [phone] Website: www.[facility].org Service(s) Offered: Assistance to needy in emergency with utility bills, heating fuel, and prescriptions. Shelter for homeless 7pm-7am. June 17, 2016 15  Agency Name: [facility] Address: [address], [place], [place] [address] Phone: [phone] Contact Person: Wayne Dawson Email: [email or web address] Website: [email or web address].org Service(s) Offered: Helps individuals with developmental disabilities move from housing that is more restrictive to homes where they  can achieve greater independence and have more  opportunities.  Agency Name: [facility] Address: [address], [place], [place] [address] Phone: [phone] Email: [email or web address] Website: www.[email or web address].org Service(s) Offered: Provides affordable housing for low-income families, elderly, and disabled individuals. Offer a wide range of  programs and services, from financial planning to  afterschool and summer programs.  Agency Name: [facility] Address: [address] [place], [place], [place] [address] Phone: [phone] Service(s) Offered: Child support services; child welfare services; food stamps; Medicaid; work first family assistance; and aid with fuel,  rent, food and medicine.  Agency Name: [facility] Address: [facility] [place], [place]  [address] Phone: [phone] Website: www.familyabuses[facility]ices.org Service(s) Offered: 24 hour Crisis Line: [phone]; 24 hour Emergency Shelter; Transitional Housing; Support Groups; Court Advocacy; Community Education; Hispanic Outreach: [phone];  Visitation Center: [phone].  Agency Name: [facility] Address: [address] [place], [place] [address] Phone: [phone] Service(s) Offered: CAP Services; Home and Community Supports; Individual or Group Supports; Respite Care Non-Institutional Nursing;  Residential Supports; Respite Care and Personal Care Services; Transportation; Family and Friends Night; Recreational Activities; Three Nutritious Meals/Snacks; Consultation with Registered Dietician; Twenty-four hour Registered Nurse Access; Daily and Community Living Skills; Camp Green [employer]; Summer Camp for the Special Population (During Summer Months) Bingo Night (Every  Wednesday Night); Special Populations Dance Night  (Every Tuesday Night); Professional Hair Care Services.  Agency Name: [facility] Address: [address], [place], [place] [address] Phone: [phone] Contact Person: Gloria McCauley Website: [email or web address] Service(s) Offered: Residential treatment facility for women; food and  clothing, educational & employment development and  transportation to work; development of financial skills;  parenting and family reunification; emotional and spiritual  support;  transitional housing for program graduates.  Agency Name: [place] [facility] Address: [address], [place], [place] [address] Phone: [phone] Email: [email or web address] Website: [email or web address] Service(s) Offered: Public housing units for elderly, disabled, and low income people; housing choice vouchers for income eligible  applicants; shelter plus care vouchers; and HOPWA voucher program.  Agency Name: [facility] Address: [address], [place],  [address] Phone: [phone] Email: [email or web address] Website: www.[facility]itatalam[facility].org Service(s) Offered: Build houses for families in need of decent housing. Each adult in the family must invest 200 hours of labor on  someone else's house, work with volunteers to build their own house, attend classes   on budgeting, home maintenance, yard care, and attend homeowner association meetings.  Agency Name: [facility] Address: [address], [place], [place] [address] Phone: [phone] Website: www.rsli.org Service(s) Offered: Intermediate care facilities for intellectually delayed, Supervised Living in group homes for adults with developmental disabilities, Supervised Living for people who have dual diagnoses (MRMI), Independent Living, Supported Living, respite and a variety of CAP services, pre-vocational services, day supports, and Community Support Services.  Agency Name: [employer] Phone: [phone] Website: www.[place].gov Service(s) Offered: Zero-interest, deferred loans to homeowners struggling to pay their mortgage. Call for more information.  

## 2022-09-15 NOTE — ED Notes (Signed)
Pt given phone and menu to order lunch

## 2022-09-15 NOTE — Care Management CC44 (Signed)
Condition Code 44 Documentation Completed  Patient Details  Name: Jaysen Wey MRN: [id number] Date of Birth: 08-10-1955   Condition Code 44 given:  Yes Patient signature on Condition Code 44 notice:  Yes Documentation of 2 MD's agreement:  Yes Code 44 added to claim:  Yes    Kreg Shropshire, RN 09/15/2022, 2:58 PM

## 2022-09-15 NOTE — ED Triage Notes (Signed)
Pt BIB EMS for increasingly worsening SOB since yesterday and abdominal pain with nausea and vomiting for the last 2 weeks. Pt dialysis patient. Was dialyzed yesterday.

## 2022-09-16 ENCOUNTER — Other Ambulatory Visit: Payer: Self-pay

## 2022-09-16 DIAGNOSIS — Z79899 Other long term (current) drug therapy: Secondary | ICD-10-CM | POA: Diagnosis not present

## 2022-09-16 LAB — TROPONIN I (HIGH SENSITIVITY)
Troponin I (High Sensitivity): 458 ng/L (ref <18)
Troponin I (High Sensitivity): 505 ng/L (ref <18)

## 2022-09-16 LAB — COMPREHENSIVE METABOLIC PANEL
ALT: 19 U/L (ref 0–44)
AST: 14 U/L — ABNORMAL LOW (ref 15–41)
Albumin: 2.9 g/dL — ABNORMAL LOW (ref 3.5–5.0)
Alkaline Phosphatase: 50 U/L (ref 38–126)
Anion gap: 19 — ABNORMAL HIGH (ref 5–15)
BUN: 69 mg/dL — ABNORMAL HIGH (ref 8–23)
CO2: 19 mmol/L — ABNORMAL LOW (ref 22–32)
Calcium: 6.8 mg/dL — ABNORMAL LOW (ref 8.9–10.3)
Chloride: 102 mmol/L (ref 98–111)
Creatinine, Ser: 8.55 mg/dL — ABNORMAL HIGH (ref 0.61–1.24)
GFR, Estimated: 6 mL/min — ABNORMAL LOW (ref >60)
Glucose, Bld: 104 mg/dL — ABNORMAL HIGH (ref 70–99)
Potassium: 4.6 mmol/L (ref 3.5–5.1)
Sodium: 140 mmol/L (ref 135–145)
Total Bilirubin: 0.3 mg/dL (ref 0.3–1.2)
Total Protein: 6.1 g/dL — ABNORMAL LOW (ref 6.5–8.1)

## 2022-09-16 LAB — LIPASE, BLOOD: Lipase: 60 U/L — ABNORMAL HIGH (ref 11–51)

## 2022-09-16 NOTE — ED Notes (Signed)
Patient A&Ox4 no confusion. Patient states "I'm leaving and need to go to work". Patient explained risk and benefit my doctor. Patient continue to demand to leave. No s/s of any distress.

## 2022-09-16 NOTE — ED Provider Notes (Cosign Needed)
Patient's care assumed from Ileene Hutchinson, PA-C at 6:30 AM.  Patient is awaiting dialysis.  Patient will return to disposition. At 9:15 AM patient informed his nurse that he does not want to stay for dialysis.  I spoke with the patient he states that he wants to go home.  He tells me he contacted his dialysis center and can have dialysis at 1:00.  I am unsure if patient actually has outpatient dialysis center or not.  Patient is choosing to leave AGAINST MEDICAL ADVICE. Patient is advised to return if symptoms become worse or change.   Elson Areas, [place] 09/16/22 [phone]
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
## 2022-09-16 NOTE — ED Notes (Signed)
Pt called out and stated to NT he was going to leave because he has to go to work and he will come back afterward.

## 2022-09-17 ENCOUNTER — Observation Stay
Admission: EM | Admit: 2022-09-17 | Discharge: 2022-09-17 | Disposition: A | Discharge status: Home / Self Care | Payer: Medicare Other | Attending: Internal Medicine | Admitting: Internal Medicine

## 2022-09-17 ENCOUNTER — Encounter: Payer: Self-pay | Admitting: Emergency Medicine

## 2022-09-17 ENCOUNTER — Other Ambulatory Visit: Payer: Self-pay

## 2022-09-17 ENCOUNTER — Emergency Department: Payer: Medicare Other

## 2022-09-17 DIAGNOSIS — I160 Hypertensive urgency: Secondary | ICD-10-CM | POA: Insufficient documentation

## 2022-09-17 DIAGNOSIS — Z79899 Other long term (current) drug therapy: Secondary | ICD-10-CM | POA: Insufficient documentation

## 2022-09-17 DIAGNOSIS — R0902 Hypoxemia: Secondary | ICD-10-CM

## 2022-09-17 DIAGNOSIS — I482 Chronic atrial fibrillation, unspecified: Secondary | ICD-10-CM | POA: Insufficient documentation

## 2022-09-17 DIAGNOSIS — I5033 Acute on chronic diastolic (congestive) heart failure: Secondary | ICD-10-CM | POA: Diagnosis not present

## 2022-09-17 DIAGNOSIS — Z8616 Personal history of COVID-19: Secondary | ICD-10-CM | POA: Insufficient documentation

## 2022-09-17 DIAGNOSIS — Z951 Presence of aortocoronary bypass graft: Secondary | ICD-10-CM | POA: Diagnosis not present

## 2022-09-17 DIAGNOSIS — I132 Hypertensive heart and chronic kidney disease with heart failure and with stage 5 chronic kidney disease, or end stage renal disease: Secondary | ICD-10-CM | POA: Insufficient documentation

## 2022-09-17 DIAGNOSIS — I5043 Acute on chronic combined systolic (congestive) and diastolic (congestive) heart failure: Secondary | ICD-10-CM | POA: Diagnosis not present

## 2022-09-17 DIAGNOSIS — Z7982 Long term (current) use of aspirin: Secondary | ICD-10-CM | POA: Diagnosis not present

## 2022-09-17 DIAGNOSIS — R0602 Shortness of breath: Principal | ICD-10-CM

## 2022-09-17 DIAGNOSIS — J811 Chronic pulmonary edema: Secondary | ICD-10-CM | POA: Diagnosis not present

## 2022-09-17 DIAGNOSIS — I10 Essential (primary) hypertension: Secondary | ICD-10-CM | POA: Insufficient documentation

## 2022-09-17 DIAGNOSIS — Z7901 Long term (current) use of anticoagulants: Secondary | ICD-10-CM | POA: Diagnosis not present

## 2022-09-17 DIAGNOSIS — E875 Hyperkalemia: Secondary | ICD-10-CM | POA: Diagnosis not present

## 2022-09-17 DIAGNOSIS — Z8551 Personal history of malignant neoplasm of bladder: Secondary | ICD-10-CM | POA: Insufficient documentation

## 2022-09-17 DIAGNOSIS — N186 End stage renal disease: Secondary | ICD-10-CM | POA: Insufficient documentation

## 2022-09-17 DIAGNOSIS — F17219 Nicotine dependence, cigarettes, with unspecified nicotine-induced disorders: Secondary | ICD-10-CM | POA: Diagnosis not present

## 2022-09-17 DIAGNOSIS — J9601 Acute respiratory failure with hypoxia: Secondary | ICD-10-CM | POA: Diagnosis not present

## 2022-09-17 DIAGNOSIS — Z992 Dependence on renal dialysis: Secondary | ICD-10-CM | POA: Insufficient documentation

## 2022-09-17 DIAGNOSIS — Z86711 Personal history of pulmonary embolism: Secondary | ICD-10-CM | POA: Diagnosis not present

## 2022-09-17 DIAGNOSIS — I2510 Atherosclerotic heart disease of native coronary artery without angina pectoris: Secondary | ICD-10-CM | POA: Insufficient documentation

## 2022-09-17 DIAGNOSIS — J810 Acute pulmonary edema: Secondary | ICD-10-CM

## 2022-09-17 LAB — RENAL FUNCTION PANEL
Albumin: 3.1 g/dL — ABNORMAL LOW (ref 3.5–5.0)
Anion gap: 14 (ref 5–15)
BUN: 55 mg/dL — ABNORMAL HIGH (ref 8–23)
CO2: 26 mmol/L (ref 22–32)
Calcium: 7.4 mg/dL — ABNORMAL LOW (ref 8.9–10.3)
Chloride: 96 mmol/L — ABNORMAL LOW (ref 98–111)
Creatinine, Ser: 5.4 mg/dL — ABNORMAL HIGH (ref 0.61–1.24)
GFR, Estimated: 11 mL/min — ABNORMAL LOW (ref >60)
Glucose, Bld: 99 mg/dL (ref 70–99)
Phosphorus: 3.4 mg/dL (ref 2.5–4.6)
Potassium: 3.5 mmol/L (ref 3.5–5.1)
Sodium: 136 mmol/L (ref 135–145)

## 2022-09-17 LAB — CBC WITH DIFFERENTIAL/PLATELET
Abs Immature Granulocytes: 0.05 10*3/uL (ref 0.00–0.07)
Basophils Absolute: 0.1 10*3/uL (ref 0.0–0.1)
Basophils Relative: 1 %
Eosinophils Absolute: 0.5 10*3/uL (ref 0.0–0.5)
Eosinophils Relative: 4 %
HCT: 34.4 % — ABNORMAL LOW (ref 39.0–52.0)
Hemoglobin: 10.8 g/dL — ABNORMAL LOW (ref 13.0–17.0)
Immature Granulocytes: 0 %
Lymphocytes Relative: 13 %
Lymphs Abs: 1.6 10*3/uL (ref 0.7–4.0)
MCH: 28.3 pg (ref 26.0–34.0)
MCHC: 31.4 g/dL (ref 30.0–36.0)
MCV: 90.3 fL (ref 80.0–100.0)
Monocytes Absolute: 1.3 10*3/uL — ABNORMAL HIGH (ref 0.1–1.0)
Monocytes Relative: 10 %
Neutro Abs: 9.1 10*3/uL — ABNORMAL HIGH (ref 1.7–7.7)
Neutrophils Relative %: 72 %
Platelets: 377 10*3/uL (ref 150–400)
RBC: 3.81 MIL/uL — ABNORMAL LOW (ref 4.22–5.81)
RDW: 16.1 % — ABNORMAL HIGH (ref 11.5–15.5)
WBC: 12.7 10*3/uL — ABNORMAL HIGH (ref 4.0–10.5)
nRBC: 0 % (ref 0.0–0.2)

## 2022-09-17 LAB — COMPREHENSIVE METABOLIC PANEL
ALT: 37 U/L (ref 0–44)
AST: 36 U/L (ref 15–41)
Albumin: 3.5 g/dL (ref 3.5–5.0)
Alkaline Phosphatase: 60 U/L (ref 38–126)
Anion gap: 18 — ABNORMAL HIGH (ref 5–15)
BUN: 96 mg/dL — ABNORMAL HIGH (ref 8–23)
CO2: 19 mmol/L — ABNORMAL LOW (ref 22–32)
Calcium: 6.5 mg/dL — ABNORMAL LOW (ref 8.9–10.3)
Chloride: 103 mmol/L (ref 98–111)
Creatinine, Ser: 9.93 mg/dL — ABNORMAL HIGH (ref 0.61–1.24)
GFR, Estimated: 5 mL/min — ABNORMAL LOW (ref >60)
Glucose, Bld: 87 mg/dL (ref 70–99)
Potassium: 6.8 mmol/L (ref 3.5–5.1)
Sodium: 140 mmol/L (ref 135–145)
Total Bilirubin: 0.9 mg/dL (ref 0.3–1.2)
Total Protein: 7.5 g/dL (ref 6.5–8.1)

## 2022-09-17 LAB — CBG MONITORING, ED
Glucose-Capillary: 87 mg/dL (ref 70–99)
Glucose-Capillary: 120 mg/dL — ABNORMAL HIGH (ref 70–99)

## 2022-09-17 LAB — GLUCOSE, CAPILLARY
Glucose-Capillary: 54 mg/dL — ABNORMAL LOW (ref 70–99)
Glucose-Capillary: 118 mg/dL — ABNORMAL HIGH (ref 70–99)

## 2022-09-17 LAB — MRSA NEXT GEN BY PCR, NASAL: MRSA by PCR Next Gen: NOT DETECTED

## 2022-09-17 LAB — BRAIN NATRIURETIC PEPTIDE: B Natriuretic Peptide: 3873 pg/mL — ABNORMAL HIGH (ref 0.0–100.0)

## 2022-09-17 LAB — TROPONIN I (HIGH SENSITIVITY)
Troponin I (High Sensitivity): 282 ng/L (ref <18)
Troponin I (High Sensitivity): 265 ng/L (ref <18)

## 2022-09-17 MED ORDER — CALCIUM GLUCONATE-NACL 1-0.675 GM/50ML-% IV SOLN
1.0000 g | Freq: Once | INTRAVENOUS | Status: AC
  Administered 2022-09-17: 1000 mg via INTRAVENOUS
  Filled 2022-09-17: qty 50

## 2022-09-17 MED ORDER — SODIUM BICARBONATE 8.4 % IV SOLN
50.0000 meq | Freq: Once | INTRAVENOUS | Status: AC
  Administered 2022-09-17: 50 meq via INTRAVENOUS
  Filled 2022-09-17: qty 50

## 2022-09-17 MED ORDER — CALCITRIOL 0.25 MCG PO CAPS
0.5000 ug | ORAL_CAPSULE | Freq: Every day | ORAL | Status: DC
  Administered 2022-09-17: 0.5 ug via ORAL
  Filled 2022-09-17: qty 2

## 2022-09-17 MED ORDER — APIXABAN 2.5 MG PO TABS: 2.5000 mg | ORAL_TABLET | Freq: Two times a day (BID) | ORAL | Status: DC

## 2022-09-17 MED ORDER — MAGNESIUM HYDROXIDE 400 MG/5ML PO SUSP: 30.0000 mL | Freq: Every day | ORAL | Status: DC | PRN

## 2022-09-17 MED ORDER — HEPARIN SODIUM (PORCINE) 1000 UNIT/ML DIALYSIS: 20.0000 [IU]/kg | INTRAMUSCULAR | Status: DC | PRN

## 2022-09-17 MED ORDER — CHLORHEXIDINE GLUCONATE CLOTH 2 % EX PADS
6.0000 | MEDICATED_PAD | Freq: Every day | CUTANEOUS | Status: DC
  Administered 2022-09-17: 6 via TOPICAL
  Filled 2022-09-17: qty 6

## 2022-09-17 MED ORDER — FUROSEMIDE 10 MG/ML IJ SOLN
100.0000 mg | Freq: Once | INTRAVENOUS | Status: AC
  Administered 2022-09-17: 100 mg via INTRAVENOUS
  Filled 2022-09-17: qty 10

## 2022-09-17 MED ORDER — ONDANSETRON HCL 4 MG PO TABS: 4.0000 mg | ORAL_TABLET | Freq: Four times a day (QID) | ORAL | Status: DC | PRN

## 2022-09-17 MED ORDER — CALCIUM ACETATE (PHOS BINDER) 667 MG PO CAPS
667.0000 mg | ORAL_CAPSULE | Freq: Three times a day (TID) | ORAL | Status: DC
  Administered 2022-09-17 (×2): 667 mg via ORAL
  Filled 2022-09-17 (×3): qty 1

## 2022-09-17 MED ORDER — TRAZODONE HCL 50 MG PO TABS: 25.0000 mg | ORAL_TABLET | Freq: Every evening | ORAL | Status: DC | PRN

## 2022-09-17 MED ORDER — APIXABAN 5 MG PO TABS
5.0000 mg | ORAL_TABLET | Freq: Two times a day (BID) | ORAL | Status: DC
  Administered 2022-09-17: 5 mg via ORAL
  Filled 2022-09-17: qty 1

## 2022-09-17 MED ORDER — ACETAMINOPHEN 650 MG RE SUPP: 650.0000 mg | Freq: Four times a day (QID) | RECTAL | Status: DC | PRN

## 2022-09-17 MED ORDER — DEXTROSE 50 % IV SOLN
1.0000 | Freq: Once | INTRAVENOUS | Status: AC
  Administered 2022-09-17: 50 mL via INTRAVENOUS
  Filled 2022-09-17: qty 50

## 2022-09-17 MED ORDER — INSULIN ASPART 100 UNIT/ML IJ SOLN
8.0000 [IU] | Freq: Once | INTRAMUSCULAR | Status: AC
  Administered 2022-09-17: 8 [IU] via SUBCUTANEOUS
  Filled 2022-09-17: qty 1

## 2022-09-17 MED ORDER — ACETAMINOPHEN 325 MG PO TABS: 650.0000 mg | ORAL_TABLET | Freq: Four times a day (QID) | ORAL | Status: DC | PRN

## 2022-09-17 MED ORDER — PANTOPRAZOLE SODIUM 40 MG PO TBEC
40.0000 mg | DELAYED_RELEASE_TABLET | Freq: Every day | ORAL | Status: DC
  Administered 2022-09-17: 40 mg via ORAL
  Filled 2022-09-17: qty 1

## 2022-09-17 MED ORDER — FUROSEMIDE 10 MG/ML IJ SOLN: 60.0000 mg | Freq: Two times a day (BID) | INTRAMUSCULAR | Status: DC

## 2022-09-17 MED ORDER — ISOSORBIDE DINITRATE 10 MG PO TABS
10.0000 mg | ORAL_TABLET | Freq: Three times a day (TID) | ORAL | Status: DC
  Filled 2022-09-17: qty 1

## 2022-09-17 MED ORDER — LORAZEPAM 2 MG/ML IJ SOLN
0.5000 mg | Freq: Once | INTRAMUSCULAR | Status: AC
  Administered 2022-09-17: 0.5 mg via INTRAVENOUS
  Filled 2022-09-17: qty 1

## 2022-09-17 MED ORDER — IPRATROPIUM-ALBUTEROL 0.5-2.5 (3) MG/3ML IN SOLN
3.0000 mL | Freq: Once | RESPIRATORY_TRACT | Status: AC
  Administered 2022-09-17: 3 mL via RESPIRATORY_TRACT
  Filled 2022-09-17: qty 3

## 2022-09-17 MED ORDER — HEPARIN SODIUM (PORCINE) 1000 UNIT/ML IJ SOLN
INTRAMUSCULAR | Status: AC
  Filled 2022-09-17: qty 10

## 2022-09-17 MED ORDER — AMLODIPINE BESYLATE 5 MG PO TABS
2.5000 mg | ORAL_TABLET | Freq: Every day | ORAL | Status: DC
  Administered 2022-09-17: 2.5 mg via ORAL
  Filled 2022-09-17: qty 1

## 2022-09-17 MED ORDER — ASPIRIN 81 MG PO TBEC
81.0000 mg | DELAYED_RELEASE_TABLET | Freq: Every day | ORAL | Status: DC
  Administered 2022-09-17: 81 mg via ORAL
  Filled 2022-09-17: qty 1

## 2022-09-17 MED ORDER — HYDRALAZINE HCL 20 MG/ML IJ SOLN
10.0000 mg | Freq: Once | INTRAMUSCULAR | Status: AC
  Administered 2022-09-17: 10 mg via INTRAVENOUS
  Filled 2022-09-17: qty 1

## 2022-09-17 MED ORDER — ONDANSETRON HCL 4 MG/2ML IJ SOLN: 4.0000 mg | Freq: Four times a day (QID) | INTRAMUSCULAR | Status: DC | PRN

## 2022-09-17 MED ORDER — ONDANSETRON 4 MG PO TBDP: 4.0000 mg | ORAL_TABLET | Freq: Three times a day (TID) | ORAL | Status: DC | PRN

## 2022-09-17 MED ORDER — METOPROLOL SUCCINATE ER 25 MG PO TB24
25.0000 mg | ORAL_TABLET | Freq: Every day | ORAL | Status: DC
  Administered 2022-09-17: 25 mg via ORAL
  Filled 2022-09-17: qty 1

## 2022-09-17 MED ORDER — NITROGLYCERIN IN D5W 200-5 MCG/ML-% IV SOLN
0.0000 ug/min | INTRAVENOUS | Status: DC
  Administered 2022-09-17: 5 ug/min via INTRAVENOUS
  Administered 2022-09-17: 70 ug/min via INTRAVENOUS
  Filled 2022-09-17 (×2): qty 250

## 2022-09-17 MED ORDER — PATIROMER SORBITEX CALCIUM 8.4 G PO PACK
8.4000 g | PACK | Freq: Once | ORAL | Status: AC
  Administered 2022-09-17: 8.4 g via ORAL
  Filled 2022-09-17: qty 1

## 2022-09-17 NOTE — Assessment & Plan Note (Signed)
-   This was aggressively managed and will be followed. - He will have urgent hemodialysis as mentioned above.

## 2022-09-17 NOTE — Assessment & Plan Note (Addendum)
-   We will continue IV nitroglycerin drip. - We will resume his antihypertensives. - This could be the main culprit for his acute CHF bedside noncompliance with hemodialysis yesterday.

## 2022-09-17 NOTE — ED Provider Notes (Signed)
[facility] Provider Note    Event Date/Time   First MD Initiated Contact with Patient 09/17/22 0303     (approximate)   History   Shortness of Breath   HPI  Anthony Hess is a 67 y.o. male  who presents to the ED from home with a chief complaint of sob. Patient is ESRD on HD T/Th/Sat. He was at [facility] hospital last night and slated for HD but left AMA to go to work. C/o chest tightness and difficulty breathing. Makes some urine. Denies fever/chills, abd pain, n/v or dizziness.       Past Medical History   Past Medical History:  Diagnosis Date  . Aortic atherosclerosis (HCC)   . Bilateral carotid artery disease (HCC)   . Bladder cancer (HCC)   . Coronary artery disease 12/20/2018   a.) LHC 12/20/2018: 50% OM1, 40% OM2, 95% o-pLAD, 75% o=pLCx, 40% mLM, 70% D1, 60% mRCA-1, 50% mRCA-2; refer to CVTS. b.) 4v CABG at [facility] on 12/27/2018: LIMA-LAD, RIMA-PDA, seg LRA-OM1-D1  . DCM (dilated cardiomyopathy) (HCC) 12/05/2018   a.) TTE 12/05/2018: EF 40-45%. b.) TTE 12/28/2019: EF 20-25%.  Marland Kitchen ESRD (end stage renal disease) (HCC)    a.) T-Th-Sat  . HFrEF (heart failure with reduced ejection fraction) (HCC) 12/05/2018   a.) TTE 12/05/2018: EF 40-45%; mild LVH; ant/apical/sep HK; mild TR . b.) TTE 12/28/2019: EF 20-25%; mod LVH; mod MR/AR; G1DD.  Marland Kitchen History of 2019 novel coronavirus disease (COVID-19) 04/08/2021  . History of kidney stones   . HLD (hyperlipidemia)   . Hx of CABG 12/27/2018  . Hypertension   . Infrarenal abdominal aortic aneurysm (AAA) without rupture (HCC) 03/05/2021   a.) CT abd/pelvis; measured 3.2 cm.  . Melena 05/04/2022  . Myocardial infarction (HCC)   . NSTEMI (non-ST elevated myocardial infarction) (HCC) 04/19/2022  . Perforation bowel (HCC) 04/26/2022  . PVD (peripheral vascular disease) (HCC)   . 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 Sepsis (HCC) 03/14/2021  . Wears glasses       Active Problem List   Patient Active Problem List   Diagnosis Date Noted  . Acute respiratory failure with hypoxia (HCC) 09/06/2022  . Fluid overload 09/06/2022  . Hypertensive emergency 09/06/2022  . HLD (hyperlipidemia) 09/06/2022  . CAD (coronary artery disease) 09/06/2022  . Pulmonary embolism (HCC) 09/06/2022  . NSTEMI (non-ST elevated myocardial infarction) (HCC) 08/17/2022  . (HFpEF) heart failure with preserved ejection fraction (HCC) 08/17/2022  . Seasonal allergic rhinitis due to pollen 06/25/2022  . Nausea 06/25/2022  . Hoarseness of voice 06/25/2022  . Establishing care with new doctor, encounter for 06/25/2022  . S/P exploratory laparotomy for perforated duodenum with admission 04/26/22 05/04/2022  . Postprocedural intraabdominal abscess 05/04/2022  . Acute pulmonary embolism (HCC) 04/19/2022  . Dyslipidemia 04/19/2022  . End-stage renal disease on hemodialysis (HCC) 04/19/2022  . Tobacco dependence 04/19/2022  . Chronic systolic heart failure (HCC) 04/05/2022  . Hypertensive urgency 11/16/2021  . Hyperkalemia 11/16/2021  . Chronic back pain 11/16/2021  . Hypertension secondary to other renal disorders 10/15/2021  . Vitamin B12 deficiency anemia due to intrinsic factor deficiency 06/23/2021  . Abdominal pain   . SBP (spontaneous bacterial peritonitis) (HCC) 04/08/2021  . COVID-19 virus infection 04/08/2021  . AAA (abdominal aortic aneurysm) (HCC) 03/18/2021  . Hypocalcemia   . Leukocytosis 03/05/2021  . Spontaneous bacterial peritonitis (HCC) 03/04/2021  . Incisional hernia, without obstruction or  gangrene   . Generalized (acute) peritonitis (HCC) 12/10/2020  . Iron deficiency anemia, unspecified 05/06/2020  . Moderate protein-calorie malnutrition (HCC) 05/06/2020  . Other long term (current) drug therapy 05/06/2020  . Unspecified jaundice 05/06/2020  . PD catheter dysfunction (HCC) 04/13/2020  . Bilateral carotid artery stenosis 02/29/2020  . Coronary  artery disease involving coronary bypass graft of native heart 02/29/2020  . Secondary hyperparathyroidism of renal origin (HCC) 01/30/2020  . Acute peritonitis (HCC) 01/08/2020  . Hyperlipidemia, mixed 01/04/2020  . Mass of left side of neck 01/04/2020  . Senile purpura (HCC) 01/04/2020  . Hydroureteronephrosis   . Atrial fibrillation, chronic (HCC) 01/05/2019  . End stage renal disease (HCC) 01/05/2019  . 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 Presence of aortocoronary bypass graft 12/27/2018  . Other emphysema (HCC) 04/14/2018  . Bilateral hydronephrosis 04/03/2018  . Hypertension 03/27/2018  . Cigarette smoker 03/20/2018  . History of bladder cancer 03/20/2018     Past Surgical History   Past Surgical History:  Procedure Laterality Date  . AV FISTULA PLACEMENT Left 07/30/2021   Procedure: INSERTION OF ARTERIOVENOUS (AV) GORE-TEX GRAFT ARM BRACHIAL ARTERY TO AXILLARY VEIN;  Surgeon: Annice Needy, MD;  Location: [facility];  Service: Vascular;  Laterality: Left;  . CAPD INSERTION N/A 12/31/2019   Procedure: LAPAROSCOPIC INSERTION CONTINUOUS AMBULATORY PERITONEAL DIALYSIS  (CAPD) CATHETER;  Surgeon: Leafy Ro, MD;  Location: [facility];  Service: General;  Laterality: N/A;  . CAPD REMOVAL N/A 04/10/2020   Procedure: LAPAROSCOPIC REVISION OF CONTINUOUS AMBULATORY PERITONEAL DIALYSIS  (CAPD) CATHETER;  Surgeon: Leafy Ro, MD;  Location: [facility];  Service: General;  Laterality: N/A;  . CORONARY ARTERY BYPASS GRAFT N/A 12/27/2018   Procedure: CORONARY ARTERY BYPASS GRAFTING (CABG) X 4 ON PUMP USING RIGHT & LEFT INTERNAL MAMMARY ARTERY LEFT RADIAL ARTERY ENDOSCOPICALLY HARVESTED;  Surgeon: Linden Dolin, MD;  Location: [facility];  Service: Open Heart Surgery;  Laterality: N/A;  . CYSTOSCOPY W/ RETROGRADES Bilateral 05/15/2019   Procedure: CYSTOSCOPY WITH RETROGRADE PYELOGRAM;  Surgeon: Riki Altes, MD;  Location: [facility];  Service: Urology;  Laterality: Bilateral;   . CYSTOSCOPY WITH BIOPSY N/A 05/15/2019   Procedure: CYSTOSCOPY WITH bladder BIOPSY;  Surgeon: Riki Altes, MD;  Location: [facility];  Service: Urology;  Laterality: N/A;  . DIALYSIS/PERMA CATHETER INSERTION N/A 12/28/2019   Procedure: DIALYSIS/PERMA CATHETER INSERTION;  Surgeon: Annice Needy, MD;  Location: [facility];  Service: Cardiovascular;  Laterality: N/A;  . DIALYSIS/PERMA CATHETER INSERTION N/A 03/18/2021   Procedure: DIALYSIS/PERMA CATHETER INSERTION;  Surgeon: Annice Needy, MD;  Location: [facility];  Service: Cardiovascular;  Laterality: N/A;  . DIALYSIS/PERMA CATHETER REMOVAL N/A 06/02/2020   Procedure: DIALYSIS/PERMA CATHETER REMOVAL;  Surgeon: Annice Needy, MD;  Location: [facility];  Service: Cardiovascular;  Laterality: N/A;  . EXCHANGE OF A DIALYSIS CATHETER Right 04/10/2020   Procedure: EXCHANGE OF A DIALYSIS CATHETER;  Surgeon: Leafy Ro, MD;  Location: [facility];  Service: General;  Laterality: Right;  . INCISIONAL HERNIA REPAIR  01/20/2021   Procedure: HERNIA REPAIR INCISIONAL;  Surgeon: Henrene Dodge, MD;  Location: [facility];  Service: General;;  . IR IMAGE GUIDED DRAINAGE PERCUT CATH  PERITONEAL RETROPERIT  04/07/2020  . LAPAROSCOPY N/A 04/16/2021   Procedure: LAPAROSCOPY DIAGNOSTIC;  Surgeon: Scottie Stanish Amabile, DO;  Location: [facility];  Service: General;  Laterality: N/A;  . LAPAROTOMY N/A 04/26/2022   Procedure: EXPLORATORY LAPAROTOMY WITH REPAIR OF DUODENAL  ULCER;  Surgeon: Carolan Shiver, MD;  Location: [facility];  Service: General;  Laterality: N/A;  . LEFT HEART CATH AND CORONARY ANGIOGRAPHY Left 12/20/2018   Procedure: LEFT HEART CATH AND CORONARY ANGIOGRAPHY;  Surgeon: Marcina Millard, MD;  Location: [facility];  Service: Cardiovascular;  Laterality: Left;  . RADIAL ARTERY HARVEST Left 12/27/2018   Procedure: ENDOSCOPIC RADIAL ARTERY HARVEST;  Surgeon: Linden Dolin, MD;  Location: [facility];  Service: Open Heart  Surgery;  Laterality: Left;  . REMOVAL OF A DIALYSIS CATHETER Left 03/20/2021   Procedure: REMOVAL OF A PD CATHETER;  Surgeon: Annice Needy, MD;  Location: [facility];  Service: Vascular;  Laterality: Left;  . REVISION OF ARTERIOVENOUS GORETEX GRAFT Left 09/11/2021   Procedure: Excisionof infected AV graft;  Surgeon: Renford Dills, MD;  Location: [facility];  Service: Vascular;  Laterality: Left;  . TEE WITHOUT CARDIOVERSION N/A 12/27/2018   Procedure: TRANSESOPHAGEAL ECHOCARDIOGRAM (TEE);  Surgeon: Linden Dolin, MD;  Location: [facility] OR;  Service: Open Heart Surgery;  Laterality: N/A;  . TUMOR REMOVAL  2019   Bladder     Home Medications   Prior to Admission medications   Medication Sig Start Date End Date Taking? Authorizing Provider  amLODipine (NORVASC) 2.5 MG tablet Take 1 tablet (2.5 mg total) by mouth daily. 05/07/22   Alford Highland, MD  apixaban (ELIQUIS) 5 MG TABS tablet Take 1 tablet (5 mg total) by mouth 2 (two) times daily. 05/01/22   Baron Hamper, MD  aspirin EC 81 MG tablet Take 81 mg by mouth daily. 05/06/20   [provider]  calcitRIOL (ROCALTROL) 0.5 MCG capsule Take 1 capsule (0.5 mcg total) by mouth daily. 11/18/21   Sunnie Nielsen, DO  calcium acetate (PHOSLO) 667 MG capsule Take 1 capsule (667 mg total) by mouth 3 (three) times daily with meals. 11/18/21   Sunnie Nielsen, DO  isosorbide dinitrate (ISORDIL) 10 MG tablet Take 1 tablet (10 mg total) by mouth 3 (three) times daily. 04/21/22   Lurene Shadow, MD  metoprolol succinate (TOPROL-XL) 25 MG 24 hr tablet Take 25 mg by mouth daily. 03/12/22   [provider]  ondansetron (ZOFRAN-ODT) 4 MG disintegrating tablet Take 1 tablet (4 mg total) by mouth every 8 (eight) hours as needed for nausea or vomiting. 06/25/22   Simmons-Robinson, Makiera, MD  pantoprazole (PROTONIX) 40 MG tablet Take 1 tablet (40 mg total) by mouth daily. 05/07/22 06/06/22  Alford Highland, MD     Allergies  Patient has no known  allergies.   Family History   Family History  Problem Relation Age of Onset  . Heart failure Mother      Physical Exam  Triage Vital Signs: ED Triage Vitals  Encounter Vitals Group     BP 09/17/22 0255 (!) 249/160     Systolic BP Percentile --      Diastolic BP Percentile --      Pulse --      Resp --      Temp 09/17/22 0255 97.7 F (36.5 C)     Temp Source 09/17/22 0255 Oral     SpO2 --      Weight 09/17/22 0258 140 lb (63.5 kg)     Height 09/17/22 0258 5\' 2"  (1.575 m)     Head Circumference --      Peak Flow --      Pain Score --      Pain Loc --      Pain Education --  Exclude from Growth Chart --     Updated Vital Signs: BP (!) 209/174   Pulse 87   Temp 97.7 F (36.5 C) (Oral)   Resp (!) 36   Ht 5\' 2"  (1.575 m)   Wt 63.5 kg   SpO2 99%   BMI 25.61 kg/m    General: Awake, moderate distress.  CV:  RRR. Good peripheral perfusion.  Resp:  Increased effort. Bibasilar rales. Abd:  Nontender. No distention.  Other:  No pedal edema.   ED Results / Procedures / Treatments  Labs (all labs ordered are listed, but only abnormal results are displayed) Labs Reviewed  CBC WITH DIFFERENTIAL/PLATELET - Abnormal; Notable for the following components:      Result Value   WBC 12.7 (*)    RBC 3.81 (*)    Hemoglobin 10.8 (*)    HCT 34.4 (*)    RDW 16.1 (*)    Neutro Abs 9.1 (*)    Monocytes Absolute 1.3 (*)    All other components within normal limits  COMPREHENSIVE METABOLIC PANEL - Abnormal; Notable for the following components:   Potassium 6.8 (*)    CO2 19 (*)    BUN 96 (*)    Creatinine, Ser 9.93 (*)    Calcium 6.5 (*)    GFR, Estimated 5 (*)    Anion gap 18 (*)    All other components within normal limits  TROPONIN I (HIGH SENSITIVITY) - Abnormal; Notable for the following components:   Troponin I (High Sensitivity) 282 (*)    All other components within normal limits  BRAIN NATRIURETIC PEPTIDE     EKG  ED ECG REPORT I, Salisa Broz J, the  attending physician, personally viewed and interpreted this ECG.   Date: 09/17/2022  EKG Time: 0300  Rate: 94  Rhythm: normal sinus rhythm  Axis: Normal  Intervals:left anterior fascicular block  ST&T Change: Nonspecific    RADIOLOGY I have independently visualized and interpreted patient's xray as well as noted the radiology interpretation:  CXR: Vascular congestion  Official radiology report(s): DG Chest Port 1 View  Result Date: 09/17/2022 CLINICAL DATA:  Shortness of breath EXAM: PORTABLE CHEST 1 VIEW COMPARISON:  09/15/2022 FINDINGS: Prior CABG. Right Ellis catheter remains in place, unchanged. Mild cardiomegaly, vascular congestion. No confluent opacities, effusions or overt edema. No acute bony abnormality. IMPRESSION: Mild cardiomegaly, vascular congestion. Electronically Signed   By: Charlett Nose M.D.   On: 09/17/2022 03:32     PROCEDURES:  Critical Care performed: Yes, see critical care procedure note(s)  CRITICAL CARE Performed by: Irean Hong   Total critical care time: 60 minutes  Critical care time was exclusive of separately billable procedures and treating other patients.  Critical care was necessary to treat or prevent imminent or life-threatening deterioration.  Critical care was time spent personally by me on the following activities: development of treatment plan with patient and/or surrogate as well as nursing, discussions with consultants, evaluation of patient's response to treatment, examination of patient, obtaining history from patient or surrogate, ordering and performing treatments and interventions, ordering and review of laboratory studies, ordering and review of radiographic studies, pulse oximetry and re-evaluation of patient's condition.   Marland Kitchen1-3 Lead EKG Interpretation  Performed by: Irean Hong, MD Authorized by: Irean Hong, MD     Interpretation: normal     ECG rate:  65   ECG rate assessment: normal     Rhythm: sinus rhythm      Ectopy: none  Conduction: normal   Comments:     Patient placed on cardiac monitor to evaluate for arrthymias    MEDICATIONS ORDERED IN ED: Medications  furosemide (LASIX) 100 mg in dextrose 5 % 50 mL IVPB (100 mg Intravenous New Bag/Given 09/17/22 0346)  nitroGLYCERIN 50 mg in dextrose 5 % 250 mL (0.2 mg/mL) infusion (40 mcg/min Intravenous Infusion Verify 09/17/22 0404)  sodium bicarbonate injection 50 mEq (has no administration in time range)  calcium gluconate 1 g/ 50 mL sodium chloride IVPB (has no administration in time range)  dextrose 50 % solution 50 mL (has no administration in time range)  insulin aspart (novoLOG) injection 8 Units (has no administration in time range)  ipratropium-albuterol (DUONEB) 0.5-2.5 (3) MG/3ML nebulizer solution 3 mL (has no administration in time range)  patiromer Lelon Perla) packet 8.4 g (has no administration in time range)  LORazepam (ATIVAN) injection 0.5 mg (0.5 mg Intravenous Given 09/17/22 0350)     IMPRESSION / MDM / ASSESSMENT AND PLAN / ED COURSE  I reviewed the triage vital signs and the nursing notes.                             66y/o male presenting with sob. Differential includes, but is not limited to, viral syndrome, bronchitis including COPD exacerbation, pneumonia, reactive airway disease including asthma, CHF including exacerbation with or without pulmonary/interstitial edema, pneumothorax, ACS, thoracic trauma, and pulmonary embolism. I have personally reviewed patient's records and note his recent [facility] ED visit.  Patient's presentation is most consistent with acute presentation with potential threat to life or bodily function.  The patient is on the cardiac monitor to evaluate for evidence of arrhythmia and/or significant heart rate changes.  Will obtain cardiac panel, cxr. Place on BiPAP, ntg gtt for blood pressure control. Anticipate hospitalization.  Clinical Course as of 09/17/22 1610  Fri Sep 17, 2022  0405 Hyperkalemia  cocktail ordered for elevated potassium. Elevated troponin likely secondary to renal disease plus demand ischemia; has decreased from yesterday. Will consult nephrology for urgent HD and consult hospitalist services for evaluation and admission. [JS]  [id number] Spoke with Dr. Thedore Mins who will order HD. [JS]    Clinical Course User Index [JS] Irean Hong, MD     FINAL CLINICAL IMPRESSION(S) / ED DIAGNOSES   Final diagnoses:  Shortness of breath  Hypoxia  ESRD (end stage renal disease) on dialysis (HCC)  Hyperkalemia  Acute pulmonary edema (HCC)  Hypertensive urgency     Rx / DC Orders   ED Discharge Orders     None        Note:  This document was prepared using Dragon voice recognition software and may include unintentional dictation errors.   Irean Hong, MD 09/17/22 [phone]

## 2022-09-17 NOTE — Assessment & Plan Note (Signed)
-   We will continue Imdur, Toprol-XL and aspirin.

## 2022-09-17 NOTE — Care Management CC44 (Signed)
Condition Code 44 Documentation Completed  Patient Details  Name: Nigel Buchwald MRN: [id number] Date of Birth: [birth year]   Condition Code 44 given:  Yes Patient signature on Condition Code 44 notice:  Yes Documentation of 2 MD's agreement:  Yes Code 44 added to claim:  Yes    Kreg Shropshire, RN 09/17/2022, 5:05 PM

## 2022-09-17 NOTE — Progress Notes (Signed)
[facility]  ROUNDING NOTE   Subjective:   Anthony Hess is a 67 year old male with past medical conditions including hypertension, PVD, dyslipidemia, CAD, four-vessel CABG, and end-stage renal disease on hemodialysis. Patient presents to the emergency department with complaints of shortness of breath. Paietn has been admitted for Shortness of breath [R06.02] Hyperkalemia [E87.5] Acute pulmonary edema (HCC) [J81.0] Hypoxia [R09.02] ESRD (end stage renal disease) on dialysis (HCC) [N18.6, Z99.2] Hypertensive urgency [I16.0] Acute respiratory failure with hypoxia (HCC) [J96.01]   Patient is known to our practice and receives outpatient dialysis at [facility], followed by Dr Cherylann Ratel.   Presented overnight for shortness of breath requiring BiPAP.  Blood pressure was elevated.  He was also noted to have severe hyperkalemia of 6.8.  He was given IV shifting measures and IV Lasix. Urgent dialysis this morning.  Now on room air. Requiring IV nitroglycerin drip for blood pressure control. Oral medications have been restarted. Patient states that he missed dialysis because of work.  He had to reschedule his dialysis from his normal day to Friday.  Objective:  Vital signs in last 24 hours:  Temp:  [96.6 F (35.9 C)-97.7 F (36.5 C)] 97.6 F (36.4 C) (07/26 0743) Pulse Rate:  [64-105] 87 (07/26 0830) Resp:  [15-57] 19 (07/26 0830) BP: (161-249)/(96-174) 186/96 (07/26 0830) SpO2:  [85 %-100 %] 94 % (07/26 0830) FiO2 (%):  [30 %] 30 % (07/26 0316) Weight:  [55.3 kg-63.5 kg] 55.3 kg (07/26 0743)  Weight change:  Filed Weights   09/17/22 0258 09/17/22 0622 09/17/22 0743  Weight: 63.5 kg 56.3 kg 55.3 kg    Intake/Output: I/O last 3 completed shifts: In: 171.7 [I.V.:21.7; IV Piggyback:150] Out: -    Intake/Output this shift:  No intake/output data recorded.  Physical Exam: General: NAD  Head: Normocephalic, atraumatic.   Eyes: Anicteric  Lungs:  Clear to auscultation,  normal effort  Heart: Regular rate and rhythm  Abdomen:  Soft, nontender  Extremities:  No peripheral edema.  Neurologic: Alert and oriented, moving all four extremities  Skin: No lesions  Access: Rt chest permcath    Basic Metabolic Panel: Recent Labs  Lab 09/14/22 0358 09/14/22 0810 09/14/22 1530 09/14/22 2201 09/15/22 0631 09/15/22 1034 09/15/22 2320 09/17/22 0317  NA 139 139  --   --   --   --  140 140  K 6.0* 4.5   < > 4.1 4.4 4.5 4.6 6.8*  CL 107 106  --   --   --   --  102 103  CO2 18* 18*  --   --   --   --  19* 19*  GLUCOSE 96 62*  --   --   --   --  104* 87  BUN 83* 84*  --   --   --   --  69* 96*  CREATININE 8.83* 8.73*  --   --   --   --  8.55* 9.93*  CALCIUM 6.6* 6.5*  --   --   --   --  6.8* 6.5*   < > = values in this interval not displayed.    Liver Function Tests: Recent Labs  Lab 09/14/22 0358 09/15/22 2320 09/17/22 0317  AST 28 14* 36  ALT 34 19 37  ALKPHOS 59 50 60  BILITOT 0.3 0.3 0.9  PROT 7.0 6.1* 7.5  ALBUMIN 3.6 2.9* 3.5   Recent Labs  Lab 09/15/22 2320  LIPASE 60*    No results for input(s): "AMMONIA"  in the last 168 hours.  CBC: Recent Labs  Lab 09/14/22 0358 09/15/22 0631 09/15/22 2320 09/17/22 0317  WBC 15.3* 8.9 9.5 12.7*  NEUTROABS 12.2*  --  6.9 9.1*  HGB 10.7* 10.3* 10.0* 10.8*  HCT 34.5* 33.3* 33.4* 34.4*  MCV 90.8 90.0 91.5 90.3  PLT 335 324 325 377    Cardiac Enzymes: No results for input(s): "CKTOTAL", "CKMB", "CKMBINDEX", "TROPONINI" in the last 168 hours.  BNP: Invalid input(s): "POCBNP"  CBG: Recent Labs  Lab 09/14/22 0921 09/17/22 0419 09/17/22 0503 09/17/22 0618 09/17/22 0704  GLUCAP 155* 87 120* 54* 118*    Microbiology: Results for orders placed or performed during the hospital encounter of 09/17/22  MRSA Next Gen by PCR, Nasal     Status: None   Collection Time: 09/17/22  6:18 AM   Specimen: Nasal Mucosa; Nasal Swab  Result Value Ref Range Status   MRSA by PCR Next Gen NOT DETECTED  NOT DETECTED Final    Comment: (NOTE) The GeneXpert MRSA Assay (FDA approved for NASAL specimens only), is one component of a comprehensive MRSA colonization surveillance program. It is not intended to diagnose MRSA infection nor to guide or monitor treatment for MRSA infections. Test performance is not FDA approved in patients less than 28 years old. Performed at [facility], [address] [place], [place] [address]     Coagulation Studies: No results for input(s): "LABPROT", "INR" in the last 72 hours.   Urinalysis: No results for input(s): "COLORURINE", "LABSPEC", "PHURINE", "GLUCOSEU", "HGBUR", "BILIRUBINUR", "KETONESUR", "PROTEINUR", "UROBILINOGEN", "NITRITE", "LEUKOCYTESUR" in the last 72 hours.  Invalid input(s): "APPERANCEUR"    Imaging: DG Chest Port 1 View  Result Date: 09/17/2022 CLINICAL DATA:  Shortness of breath EXAM: PORTABLE CHEST 1 VIEW COMPARISON:  09/15/2022 FINDINGS: Prior CABG. Right Ellis catheter remains in place, unchanged. Mild cardiomegaly, vascular congestion. No confluent opacities, effusions or overt edema. No acute bony abnormality. IMPRESSION: Mild cardiomegaly, vascular congestion. Electronically Signed   By: Charlett Nose M.D.   On: 09/17/2022 03:32   DG Chest Port 1 View  Result Date: 09/15/2022 CLINICAL DATA:  Shortness of breath EXAM: PORTABLE CHEST 1 VIEW COMPARISON:  Chest x-ray 09/14/2022 FINDINGS: Right-sided central venous catheter tip projects over the distal SVC. Sternotomy wires are again noted. The cardiomediastinal silhouette appears unchanged and within normal limits. There is no focal lung infiltrate, pleural effusion or pneumothorax. There is mild curvature of the thoracic spine, similar to prior. IMPRESSION: No active disease. Electronically Signed   By: Darliss Cheney M.D.   On: 09/15/2022 23:44   ECHOCARDIOGRAM COMPLETE  Result Date: 09/15/2022    ECHOCARDIOGRAM REPORT   Patient Name:   Anthony Hess Date of Exam:  09/15/2022 Medical Rec #:  [id number]    Height:       62.0 in Accession #:    [id number]   Weight:       117.7 lb Date of Birth:  09-01-1955   BSA:          1.526 m Patient Age:    66 years     BP:           146/106 mmHg Patient Gender: M            HR:           75 bpm. Exam Location:  [facility] Procedure: 2D Echo, Cardiac Doppler and Color Doppler Indications:     Chest pain  History:         Patient has  prior history of Echocardiogram examinations, most                  recent 04/19/2022. CHF, CAD, Acute MI and Previous Myocardial                  Infarction, Prior CABG, Arrythmias:Atrial Fibrillation,                  Signs/Symptoms:Chest Pain; Risk Factors:Hypertension,                  Dyslipidemia and Current Smoker. AAA, Pulmonary embolus, ESRD                  on dialysis.  Sonographer:     Mikki Harbor Referring Phys:  [id number] LILY MICHELLE TANG Diagnosing Phys: Alwyn Pea MD IMPRESSIONS  1. Left ventricular ejection fraction, by estimation, is 55 to 60%. The left ventricle has normal function. The left ventricle has no regional wall motion abnormalities. The left ventricular internal cavity size was mildly dilated. Left ventricular diastolic parameters are consistent with Grade I diastolic dysfunction (impaired relaxation).  2. Right ventricular systolic function is normal. The right ventricular size is normal. There is normal pulmonary artery systolic pressure.  3. The mitral valve is grossly normal. Trivial mitral valve regurgitation.  4. The aortic valve is abnormal. Aortic valve regurgitation is not visualized. Aortic valve sclerosis/calcification is present, without any evidence of aortic stenosis. FINDINGS  Left Ventricle: Left ventricular ejection fraction, by estimation, is 55 to 60%. The left ventricle has normal function. The left ventricle has no regional wall motion abnormalities. The left ventricular internal cavity size was mildly dilated. There is  borderline concentric left  ventricular hypertrophy. Left ventricular diastolic parameters are consistent with Grade I diastolic dysfunction (impaired relaxation). Right Ventricle: The right ventricular size is normal. No increase in right ventricular wall thickness. Right ventricular systolic function is normal. There is normal pulmonary artery systolic pressure. The tricuspid regurgitant velocity is 2.52 m/s, and  with an assumed right atrial pressure of 3 mmHg, the estimated right ventricular systolic pressure is 28.4 mmHg. Left Atrium: Left atrial size was normal in size. Right Atrium: Right atrial size was normal in size. Pericardium: There is no evidence of pericardial effusion. Mitral Valve: The mitral valve is grossly normal. There is mild thickening of the mitral valve leaflet(s). There is mild calcification of the mitral valve leaflet(s). Mildly decreased mobility of the mitral valve leaflets. Trivial mitral valve regurgitation. MV peak gradient, 4.2 mmHg. The mean mitral valve gradient is 1.0 mmHg. Tricuspid Valve: The tricuspid valve is normal in structure. Tricuspid valve regurgitation is trivial. Aortic Valve: The aortic valve is abnormal. Aortic valve regurgitation is not visualized. Aortic valve sclerosis/calcification is present, without any evidence of aortic stenosis. Aortic valve mean gradient measures 6.0 mmHg. Aortic valve peak gradient measures 14.5 mmHg. Aortic valve area, by VTI measures 2.26 cm. Pulmonic Valve: The pulmonic valve was normal in structure. Pulmonic valve regurgitation is not visualized. Aorta: The ascending aorta was not well visualized. IAS/Shunts: No atrial level shunt detected by color flow Doppler.  LEFT VENTRICLE PLAX 2D LVIDd:         5.10 cm   Diastology LVIDs:         3.60 cm   LV e' medial:    5.77 cm/s LV PW:         1.10 cm   LV E/e' medial:  10.1 LV IVS:  1.40 cm   LV e' lateral:   9.36 cm/s LVOT diam:     1.90 cm   LV E/e' lateral: 6.2 LV SV:         69 LV SV Index:   45 LVOT Area:      2.84 cm  RIGHT VENTRICLE RV Basal diam:  3.20 cm RV Mid diam:    3.40 cm RV S prime:     7.07 cm/s LEFT ATRIUM             Index        RIGHT ATRIUM           Index LA diam:        4.10 cm 2.69 cm/m   RA Area:     14.90 cm LA Vol (A2C):   59.7 ml 39.12 ml/m  RA Volume:   35.30 ml  23.13 ml/m LA Vol (A4C):   47.3 ml 30.99 ml/m LA Biplane Vol: 54.5 ml 35.71 ml/m  AORTIC VALVE                     PULMONIC VALVE AV Area (Vmax):    2.07 cm      PV Vmax:       1.28 m/s AV Area (Vmean):   2.17 cm      PV Peak grad:  6.6 mmHg AV Area (VTI):     2.26 cm AV Vmax:           190.50 cm/s AV Vmean:          105.450 cm/s AV VTI:            0.303 m AV Peak Grad:      14.5 mmHg AV Mean Grad:      6.0 mmHg LVOT Vmax:         139.00 cm/s LVOT Vmean:        80.700 cm/s LVOT VTI:          0.242 m LVOT/AV VTI ratio: 0.80  AORTA Ao Root diam: 3.50 cm MITRAL VALVE               TRICUSPID VALVE MV Area (PHT): 2.69 cm    TR Peak grad:   25.4 mmHg MV Area VTI:   3.48 cm    TR Vmax:        252.00 cm/s MV Peak grad:  4.2 mmHg MV Mean grad:  1.0 mmHg    SHUNTS MV Vmax:       1.02 m/s    Systemic VTI:  0.24 m MV Vmean:      52.5 cm/s   Systemic Diam: 1.90 cm MV Decel Time: 282 msec MV E velocity: 58.20 cm/s MV A velocity: 98.00 cm/s MV E/A ratio:  0.59 Dwayne D Callwood MD Electronically signed by Alwyn Pea MD Signature Date/Time: 09/15/2022/4:58:54 PM    Final      Medications:    nitroGLYCERIN 95 mcg/min (09/17/22 0547)     amLODipine  2.5 mg Oral Daily   apixaban  5 mg Oral BID   aspirin EC  81 mg Oral Daily   calcitRIOL  0.5 mcg Oral Daily   calcium acetate  667 mg Oral TID WC   Chlorhexidine Gluconate Cloth  6 each Topical Q0600   furosemide  60 mg Intravenous Q12H   metoprolol succinate  25 mg Oral Daily   pantoprazole  40 mg Oral Daily   acetaminophen **OR** acetaminophen, heparin, magnesium hydroxide, ondansetron **OR**  ondansetron (ZOFRAN) IV, traZODone  Assessment/ Plan:  [name] is a  67 y.o.  male with past medical conditions including hypertension, PVD, dyslipidemia, CAD, four-vessel CABG, and end-stage renal disease on hemodialysis. Patient presents to the emergency department with complaints of shortness of breath. He will be admitted for Shortness of breath [R06.02] Hyperkalemia [E87.5] Acute pulmonary edema (HCC) [J81.0] Hypoxia [R09.02] ESRD (end stage renal disease) on dialysis (HCC) [N18.6, Z99.2] Hypertensive urgency [I16.0] Acute respiratory failure with hypoxia (HCC) [J96.01]   End stage renal disease  on hemodialysis.presents with with hyperkalemia and shortness of breath Patient reports drinking Gatorade which may have led to hyperkalemia.  Recommended to avoid electrolyte containing drinks.  Can drink clear sodas or ice tea and water.  Urgent dialysis for correction of hyperkalemia. Shortness of breath is likely secondary to diastolic dysfunction caused by severe hypertension. Breathing has significantly improved with blood pressure control.  2. Anemia of chronic kidney disease Lab Results  Component Value Date   HGB 10.8 (L) 09/17/2022    Hgb within desired target.  No need for ESA's.  Continue outpatient management.  4. Secondary Hyperparathyroidism:    Lab Results  Component Value Date   PTH 170 (H) 06/25/2022   CALCIUM 6.5 (L) 09/17/2022   CAION 0.84 (LL) 09/11/2021   PHOS 6.0 (H) 09/08/2022    Continue calcitriol and calcium acetate with meals as prescribed outpatient.    5. Hypertension with chronic kidney disease. Home regimen includes amlodipine, isosorbide, and metoprolol.  Blood pressure elevated to 249/160 at arrival.  This may have caused diastolic dysfunction and shortness of breath.   Better controlled now at 138/112.  6.  Recent NSTEMI  Was evaluated by cardiology team EKG stable, likely from demand ischemia. Continue medical management and strict blood pressure control.   LOS: 0 Nadene Witherspoon Thedore Mins 7/26/20248:54 AM Critical care  coordinated with dialysis staff, emergency room staff, hospitalist team

## 2022-09-17 NOTE — Assessment & Plan Note (Signed)
Management as above °

## 2022-09-17 NOTE — ED Triage Notes (Signed)
Patient ambulatory to triage with steady gait--assisted into w/c; pt with tripod breathing noted; pt reports missing dialysis yesterday; c/o [facility] tonight; denies pain, denies accomp symptoms

## 2022-09-17 NOTE — Progress Notes (Signed)
Set up patient on bipap per MD order. Settings 10/6 @ 30%.

## 2022-09-17 NOTE — Progress Notes (Signed)
Brief note.  Full note to follow.  67 year old male presented to the ED for shortness of breath secondary to missed hemodialysis.  He is currently hypertensive on nitro gtt.  Discussed case with nephrology.  Plan for HD in ICU today.  If respiratory status stable and blood pressure stable off of nitro drip post HD patient may be able to discharge home this afternoon.  Lolita Patella MD  No charge

## 2022-09-17 NOTE — Assessment & Plan Note (Signed)
-   We will continue Eliquis. 

## 2022-09-17 NOTE — Assessment & Plan Note (Addendum)
-   This is clearly secondary to fluid overload with ESRD on HD and acute on chronic diastolic CHF. - The patient will be admitted to stepdown unit bed. - The patient initially required BiPAP and was tapered to nasal cannula. - Nephrology consult to be obtained. - Dr. Thedore Mins was notified and is aware about the patient. - He will be planning urgent hemodialysis. - O2 protocol will be followed. - We will utilize IV Lasix for diuresis.  He does make small amount of urine and it should help with pulmonary congestion.

## 2022-09-17 NOTE — ED Notes (Signed)
Pt refusing to wear Bipap. Pt informed on benefits of bipap and encouraged to wear. MD informed.

## 2022-09-17 NOTE — Care Management Obs Status (Signed)
MEDICARE OBSERVATION STATUS NOTIFICATION   Patient Details  Name: Anthony Hess MRN: [id number] Date of Birth: 02-11-1956   Medicare Observation Status Notification Given:  Yes    Kreg Shropshire, RN 09/17/2022, 5:05 PM

## 2022-09-17 NOTE — Assessment & Plan Note (Addendum)
-   Management as above for current fluid overload and missing hemodialysis yesterday.

## 2022-09-17 NOTE — Telephone Encounter (Signed)
Copied from CRM [id number]. Topic: General - Inquiry >> Sep 16, 2022  8:52 AM Patsy Lager T wrote: Reason for CRM: Marchelle Folks from [facility] called to find out why patient was referred to them. Please f/u with Marchelle Folks with more info

## 2022-09-17 NOTE — H&P (Signed)
[place]   PATIENT NAME: Anthony Hess    MR#:  [id number]  DATE OF BIRTH:  August 26, 1955  DATE OF ADMISSION:  09/17/2022  PRIMARY CARE PHYSICIAN: Ronnald Ramp, MD   Patient is coming from: Home  REQUESTING/REFERRING PHYSICIAN: Chiquita Loth, MD  CHIEF COMPLAINT:   Chief Complaint  Patient presents with   Shortness of Breath    HISTORY OF PRESENT ILLNESS:  Anthony Hess is a 67 y.o. male with medical history significant for aortic atherosclerosis, bladder cancer, coronary artery disease, s/p four-vessel CABG, end-stage renal disease on hemodialysis, PVD, hypertension, bilateral carotid artery stenosis, and dyslipidemia, who presented to the ER with acute onset of worsening dyspnea with significant respiratory distress.  The patient was [facility] last night and was expected to have HD but left AMA to go to work.  He was expected to go to [facility] but did not go for hemodialysis.  He has been experiencing chest tightness with dyspnea.  He admitted to dyspnea on exertion, paroxysmal nocturnal dyspnea and orthopnea lately.  No cough or wheezing or hemoptysis.  No fever or chills.  No nausea or vomiting or abdominal pain.  No headache or dizziness or blurred vision.  ED Course: When he came to the ER, BP was 181/128 with respiratory rate of 21 and later 25 and otherwise normal vital signs.  He was in significant respiratory distress he was placed on BiPAP.  Labs revealed a potassium of 6.8 and CO2 19, BUN at 96 and creatinine 9.93 with calcium of 6.5 and anion gap of 18 and unremarkable LFTs.  BNP was 3873 and high sensitive troponin I was 282 down from 505 yesterday a.m. and later 265.  CBC showed leukocytosis 12.7 up from 9.5 a couple of days ago and anemia better than previous levels EKG as reviewed by me : EKG showed sinus rhythm with a rate of 94 with probable left atrial enlargement, incomplete right bundle branch block left anterior fascicular block. Imaging: Portable chest x-ray  showed mild cardiomegaly with vascular congestion.  Most recent 2D echo on 09/15/22 revealed an EF of 5055 to 60% with grade 1 diastolic dysfunction and trivial mitral regurgitation.  The patient was given 8.5 g of p.o. Veltassa, 0.5 mg of IV Ativan, DuoNeb, 8 units of subcutaneous insulin and an amp of D50, 100 mg of IV Lasix and 1 g of IV calcium gluconate as well as 1 amp of sodium bicarbonate.  He will be admitted to a step down unit bed for further evaluation and management. PAST MEDICAL HISTORY:   Past Medical History:  Diagnosis Date   Aortic atherosclerosis (HCC)    Bilateral carotid artery disease (HCC)    Bladder cancer (HCC)    Coronary artery disease 12/20/2018   a.) LHC 12/20/2018: 50% OM1, 40% OM2, 95% o-pLAD, 75% o=pLCx, 40% mLM, 70% D1, 60% mRCA-1, 50% mRCA-2; refer to CVTS. b.) 4v CABG at [facility] on 12/27/2018: LIMA-LAD, RIMA-PDA, seg LRA-OM1-D1   DCM (dilated cardiomyopathy) (HCC) 12/05/2018   a.) TTE 12/05/2018: EF 40-45%. b.) TTE 12/28/2019: EF 20-25%.   ESRD (end stage renal disease) (HCC)    a.) T-Th-Sat   HFrEF (heart failure with reduced ejection fraction) (HCC) 12/05/2018   a.) TTE 12/05/2018: EF 40-45%; mild LVH; ant/apical/sep HK; mild TR . b.) TTE 12/28/2019: EF 20-25%; mod LVH; mod MR/AR; G1DD.   History of 2019 novel coronavirus disease (COVID-19) 04/08/2021   History of kidney stones    HLD (hyperlipidemia)    Hx  of CABG 12/27/2018   Hypertension    Infrarenal abdominal aortic aneurysm (AAA) without rupture (HCC) 03/05/2021   a.) CT abd/pelvis; measured 3.2 cm.   Melena 05/04/2022   Myocardial infarction [facility])    NSTEMI (non-ST elevated myocardial infarction) (HCC) 04/19/2022   Perforation bowel (HCC) 04/26/2022   PVD (peripheral vascular disease) (HCC)    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   a.) LIMA-LAD, RIMA-PDA, sequential LEFT radial artery to OM1 and D1   Sepsis (HCC) 03/14/2021   Wears glasses     PAST SURGICAL HISTORY:   Past Surgical History:   Procedure Laterality Date   AV FISTULA PLACEMENT Left 07/30/2021   Procedure: INSERTION OF ARTERIOVENOUS (AV) GORE-TEX GRAFT ARM BRACHIAL ARTERY TO AXILLARY VEIN;  Surgeon: Annice Needy, MD;  Location: [facility] ORS;  Service: Vascular;  Laterality: Left;   CAPD INSERTION N/A 12/31/2019   Procedure: LAPAROSCOPIC INSERTION CONTINUOUS AMBULATORY PERITONEAL DIALYSIS  (CAPD) CATHETER;  Surgeon: Leafy Ro, MD;  Location: [facility] ORS;  Service: General;  Laterality: N/A;   CAPD REMOVAL N/A 04/10/2020   Procedure: LAPAROSCOPIC REVISION OF CONTINUOUS AMBULATORY PERITONEAL DIALYSIS  (CAPD) CATHETER;  Surgeon: Leafy Ro, MD;  Location: [facility] ORS;  Service: General;  Laterality: N/A;   CORONARY ARTERY BYPASS GRAFT N/A 12/27/2018   Procedure: CORONARY ARTERY BYPASS GRAFTING (CABG) X 4 ON PUMP USING RIGHT & LEFT INTERNAL MAMMARY ARTERY LEFT RADIAL ARTERY ENDOSCOPICALLY HARVESTED;  Surgeon: Linden Dolin, MD;  Location: [facility];  Service: Open Heart Surgery;  Laterality: N/A;   CYSTOSCOPY W/ RETROGRADES Bilateral 05/15/2019   Procedure: CYSTOSCOPY WITH RETROGRADE PYELOGRAM;  Surgeon: Riki Altes, MD;  Location: [facility] ORS;  Service: Urology;  Laterality: Bilateral;   CYSTOSCOPY WITH BIOPSY N/A 05/15/2019   Procedure: CYSTOSCOPY WITH bladder BIOPSY;  Surgeon: Riki Altes, MD;  Location: [facility] ORS;  Service: Urology;  Laterality: N/A;   DIALYSIS/PERMA CATHETER INSERTION N/A 12/28/2019   Procedure: DIALYSIS/PERMA CATHETER INSERTION;  Surgeon: Annice Needy, MD;  Location: [facility] INV[facility]IVE [facility] LAB;  Service: Cardiovascular;  Laterality: N/A;   DIALYSIS/PERMA CATHETER INSERTION N/A 03/18/2021   Procedure: DIALYSIS/PERMA CATHETER INSERTION;  Surgeon: Annice Needy, MD;  Location: [facility];  Service: Cardiovascular;  Laterality: N/A;   DIALYSIS/PERMA CATHETER REMOVAL N/A 06/02/2020   Procedure: DIALYSIS/PERMA CATHETER REMOVAL;  Surgeon: Annice Needy, MD;  Location: [facility] LAB;  Service:  Cardiovascular;  Laterality: N/A;   EXCHANGE OF A DIALYSIS CATHETER Right 04/10/2020   Procedure: EXCHANGE OF A DIALYSIS CATHETER;  Surgeon: Leafy Ro, MD;  Location: [facility];  Service: General;  Laterality: Right;   INCISIONAL HERNIA REPAIR  01/20/2021   Procedure: HERNIA REPAIR INCISIONAL;  Surgeon: Henrene Dodge, MD;  Location: [facility];  Service: General;;   IR IMAGE GUIDED DRAINAGE PERCUT CATH  PERITONEAL RETROPERIT  04/07/2020   LAPAROSCOPY N/A 04/16/2021   Procedure: LAPAROSCOPY DIAGNOSTIC;  Surgeon: Sung Amabile, DO;  Location: [facility];  Service: General;  Laterality: N/A;   LAPAROTOMY N/A 04/26/2022   Procedure: EXPLORATORY LAPAROTOMY WITH REPAIR OF DUODENAL ULCER;  Surgeon: Carolan Shiver, MD;  Location: [facility] OR[facility];  Service: General;  Laterality: N/A;   LEFT HEART CATH AND CORONARY ANGIOGRAPHY Left 12/20/2018   Procedure: LEFT HEART CATH AND CORONARY ANGIOGRAPHY;  Surgeon: Marcina Millard, MD;  Location: [facility];  Service: Cardiovascular;  Laterality: Left;   RADIAL ARTERY HARVEST Left 12/27/2018   Procedure: ENDOSCOPIC RADIAL ARTERY HARVEST;  Surgeon: Linden Dolin, MD;  [facility];  Service: Open Heart Surgery;  Laterality: Left;   REMOVAL OF A DIALYSIS CATHETER Left 03/20/2021   Procedure: REMOVAL OF A PD CATHETER;  Surgeon: Annice Needy, MD;  Location: [facility];  Service: Vascular;  Laterality: Left;   REVISION OF ARTERIOVENOUS GORETEX GRAFT Left 09/11/2021   Procedure: Excisionof infected AV graft;  Surgeon: Renford Dills, MD;  Location: [facility] OR[facility];  Service: Vascular;  Laterality: Left;   TEE WITHOUT CARDIOVERSION N/A 12/27/2018   Procedure: TRANSESOPHAGEAL ECHOCARDIOGRAM (TEE);  Surgeon: Linden Dolin, MD;  Location[facility];  Service: Open Heart Surgery;  Laterality: N/A;   TUMOR REMOVAL  2019   Bladder    SOCIAL HISTORY:   Social History   Tobacco Use   Smoking status: Every Day    Current packs/day: 0.00    Types: Cigarettes     Last attempt to quit: 02/19/2019    Years since quitting: 3.5   Smokeless tobacco: Never  Substance Use Topics   Alcohol use: Never    FAMILY HISTORY:   Family History  Problem Relation Age of Onset   Heart failure Mother     DRUG ALLERGIES:  No Known Allergies  REVIEW OF SYSTEMS:   ROS As per history of present illness. All pertinent systems were reviewed above. Constitutional, HEENT, cardiovascular, respiratory, GI, GU, musculoskeletal, neuro, psychiatric, endocrine, integumentary and hematologic systems were reviewed and are otherwise negative/unremarkable except for positive findings mentioned above in the HPI.   MEDICATIONS AT HOME:   Prior to Admission medications   Medication Sig Start Date End Date Taking? Authorizing Provider  amLODipine (NORVASC) 2.5 MG tablet Take 1 tablet (2.5 mg total) by mouth daily. 05/07/22   Alford Highland, MD  apixaban (ELIQUIS) 5 MG TABS tablet Take 1 tablet (5 mg total) by mouth 2 (two) times daily. 05/01/22   Baron Hamper, MD  aspirin EC 81 MG tablet Take 81 mg by mouth daily. 05/06/20   [provider]  calcitRIOL (ROCALTROL) 0.5 MCG capsule Take 1 capsule (0.5 mcg total) by mouth daily. 11/18/21   Sunnie Nielsen, DO  calcium acetate (PHOSLO) 667 MG capsule Take 1 capsule (667 mg total) by mouth 3 (three) times daily with meals. 11/18/21   Sunnie Nielsen, DO  isosorbide dinitrate (ISORDIL) 10 MG tablet Take 1 tablet (10 mg total) by mouth 3 (three) times daily. 04/21/22   Lurene Shadow, MD  metoprolol succinate (TOPROL-XL) 25 MG 24 hr tablet Take 25 mg by mouth daily. 03/12/22   [provider]  ondansetron (ZOFRAN-ODT) 4 MG disintegrating tablet Take 1 tablet (4 mg total) by mouth every 8 (eight) hours as needed for nausea or vomiting. 06/25/22   Simmons-Robinson, Makiera, MD  pantoprazole (PROTONIX) 40 MG tablet Take 1 tablet (40 mg total) by mouth daily. 05/07/22 06/06/22  Alford Highland, MD      VITAL SIGNS:  Blood  pressure (!) 173/116, pulse 89, temperature (!) 97.3 F (36.3 C), temperature source Oral, resp. rate 18, height 5\' 2"  (1.575 m), weight 56.3 kg, SpO2 100%.  PHYSICAL EXAMINATION:  Physical Exam  GENERAL: Acutely ill 67 y.o.-year-old patient lying in the bed with mild to moderate respiratory distress with conversational dyspnea.  He was on BiPAP that was tapered down to nasal cannula. EYES: Pupils equal, round, reactive to light and accommodation. No scleral icterus. Extraocular muscles intact.  HEENT: Head atraumatic, normocephalic. Oropharynx and nasopharynx clear.  NECK:  Supple, no jugular venous distention. No thyroid enlargement, no tenderness.  LUNGS: Diminished  bibasilar breath sounds with bibasilar rales.  No use of accessory muscles of respiration.  CARDIOVASCULAR: Regular rate and rhythm, S1, S2 normal. No murmurs, rubs, or gallops.  ABDOMEN: Soft, nondistended, nontender. Bowel sounds present. No organomegaly or mass.  EXTREMITIES: No pedal edema, cyanosis, or clubbing.  NEUROLOGIC: Cranial nerves II through XII are intact. Muscle strength 5/5 in all extremities. Sensation intact. Gait not checked.  PSYCHIATRIC: The patient is alert and oriented x 3.  Normal affect and good eye contact. SKIN: No obvious rash, lesion, or ulcer.   LABORATORY PANEL:   CBC Recent Labs  Lab 09/17/22 0317  WBC 12.7*  HGB 10.8*  HCT 34.4*  PLT 377   ------------------------------------------------------------------------------------------------------------------  Chemistries  Recent Labs  Lab 09/17/22 0317  NA 140  K 6.8*  CL 103  CO2 19*  GLUCOSE 87  BUN 96*  CREATININE 9.93*  CALCIUM 6.5*  AST 36  ALT 37  ALKPHOS 60  BILITOT 0.9   ------------------------------------------------------------------------------------------------------------------  Cardiac Enzymes No results for input(s): "TROPONINI" in the last 168  hours. ------------------------------------------------------------------------------------------------------------------  RADIOLOGY:  DG Chest Port 1 View  Result Date: 09/17/2022 CLINICAL DATA:  Shortness of breath EXAM: PORTABLE CHEST 1 VIEW COMPARISON:  09/15/2022 FINDINGS: Prior CABG. Right Ellis catheter remains in place, unchanged. Mild cardiomegaly, vascular congestion. No confluent opacities, effusions or overt edema. No acute bony abnormality. IMPRESSION: Mild cardiomegaly, vascular congestion. Electronically Signed   By: Charlett Nose M.D.   On: 09/17/2022 03:32   DG Chest Port 1 View  Result Date: 09/15/2022 CLINICAL DATA:  Shortness of breath EXAM: PORTABLE CHEST 1 VIEW COMPARISON:  Chest x-ray 09/14/2022 FINDINGS: Right-sided central venous catheter tip projects over the distal SVC. Sternotomy wires are again noted. The cardiomediastinal silhouette appears unchanged and within normal limits. There is no focal lung infiltrate, pleural effusion or pneumothorax. There is mild curvature of the thoracic spine, similar to prior. IMPRESSION: No active disease. Electronically Signed   By: Darliss Cheney M.D.   On: 09/15/2022 23:44   ECHOCARDIOGRAM COMPLETE  Result Date: 09/15/2022    ECHOCARDIOGRAM REPORT   Patient Name:   Anthony Hess Date of Exam: 09/15/2022 Medical Rec #:  [id number]    Height:       62.0 in Accession #:    [id number]   Weight:       117.7 lb Date of Birth:  10-20-1955   BSA:          1.526 m Patient Age:    66 years     BP:           146/106 mmHg Patient Gender: M            HR:           75 bpm. Exam Location:  [facility] Procedure: 2D Echo, Cardiac Doppler and Color Doppler Indications:     Chest pain  History:         Patient has prior history of Echocardiogram examinations, most                  recent 04/19/2022. CHF, CAD, Acute MI and Previous Myocardial                  Infarction, Prior CABG, Arrythmias:Atrial Fibrillation,                  Signs/Symptoms:Chest Pain; Risk  Factors:Hypertension,                  Dyslipidemia and Current  Smoker. AAA, Pulmonary embolus, ESRD                  on dialysis.  Sonographer:     Mikki Harbor Referring Phys:  [id number] LILY MICHELLE TANG Diagnosing Phys: Alwyn Pea MD IMPRESSIONS  1. Left ventricular ejection fraction, by estimation, is 55 to 60%. The left ventricle has normal function. The left ventricle has no regional wall motion abnormalities. The left ventricular internal cavity size was mildly dilated. Left ventricular diastolic parameters are consistent with Grade I diastolic dysfunction (impaired relaxation).  2. Right ventricular systolic function is normal. The right ventricular size is normal. There is normal pulmonary artery systolic pressure.  3. The mitral valve is grossly normal. Trivial mitral valve regurgitation.  4. The aortic valve is abnormal. Aortic valve regurgitation is not visualized. Aortic valve sclerosis/calcification is present, without any evidence of aortic stenosis. FINDINGS  Left Ventricle: Left ventricular ejection fraction, by estimation, is 55 to 60%. The left ventricle has normal function. The left ventricle has no regional wall motion abnormalities. The left ventricular internal cavity size was mildly dilated. There is  borderline concentric left ventricular hypertrophy. Left ventricular diastolic parameters are consistent with Grade I diastolic dysfunction (impaired relaxation). Right Ventricle: The right ventricular size is normal. No increase in right ventricular wall thickness. Right ventricular systolic function is normal. There is normal pulmonary artery systolic pressure. The tricuspid regurgitant velocity is 2.52 m/s, and  with an assumed right atrial pressure of 3 mmHg, the estimated right ventricular systolic pressure is 28.4 mmHg. Left Atrium: Left atrial size was normal in size. Right Atrium: Right atrial size was normal in size. Pericardium: There is no evidence of pericardial effusion.  Mitral Valve: The mitral valve is grossly normal. There is mild thickening of the mitral valve leaflet(s). There is mild calcification of the mitral valve leaflet(s). Mildly decreased mobility of the mitral valve leaflets. Trivial mitral valve regurgitation. MV peak gradient, 4.2 mmHg. The mean mitral valve gradient is 1.0 mmHg. Tricuspid Valve: The tricuspid valve is normal in structure. Tricuspid valve regurgitation is trivial. Aortic Valve: The aortic valve is abnormal. Aortic valve regurgitation is not visualized. Aortic valve sclerosis/calcification is present, without any evidence of aortic stenosis. Aortic valve mean gradient measures 6.0 mmHg. Aortic valve peak gradient measures 14.5 mmHg. Aortic valve area, by VTI measures 2.26 cm. Pulmonic Valve: The pulmonic valve was normal in structure. Pulmonic valve regurgitation is not visualized. Aorta: The ascending aorta was not well visualized. IAS/Shunts: No atrial level shunt detected by color flow Doppler.  LEFT VENTRICLE PLAX 2D LVIDd:         5.10 cm   Diastology LVIDs:         3.60 cm   LV e' medial:    5.77 cm/s LV PW:         1.10 cm   LV E/e' medial:  10.1 LV IVS:        1.40 cm   LV e' lateral:   9.36 cm/s LVOT diam:     1.90 cm   LV E/e' lateral: 6.2 LV SV:         69 LV SV Index:   45 LVOT Area:     2.84 cm  RIGHT VENTRICLE RV Basal diam:  3.20 cm RV Mid diam:    3.40 cm RV S prime:     7.07 cm/s LEFT ATRIUM             Index  RIGHT ATRIUM           Index LA diam:        4.10 cm 2.69 cm/m   RA Area:     14.90 cm LA Vol (A2C):   59.7 ml 39.12 ml/m  RA Volume:   35.30 ml  23.13 ml/m LA Vol (A4C):   47.3 ml 30.99 ml/m LA Biplane Vol: 54.5 ml 35.71 ml/m  AORTIC VALVE                     PULMONIC VALVE AV Area (Vmax):    2.07 cm      PV Vmax:       1.28 m/s AV Area (Vmean):   2.17 cm      PV Peak grad:  6.6 mmHg AV Area (VTI):     2.26 cm AV Vmax:           190.50 cm/s AV Vmean:          105.450 cm/s AV VTI:            0.303 m AV Peak  Grad:      14.5 mmHg AV Mean Grad:      6.0 mmHg LVOT Vmax:         139.00 cm/s LVOT Vmean:        80.700 cm/s LVOT VTI:          0.242 m LVOT/AV VTI ratio: 0.80  AORTA Ao Root diam: 3.50 cm MITRAL VALVE               TRICUSPID VALVE MV Area (PHT): 2.69 cm    TR Peak grad:   25.4 mmHg MV Area VTI:   3.48 cm    TR Vmax:        252.00 cm/s MV Peak grad:  4.2 mmHg MV Mean grad:  1.0 mmHg    SHUNTS MV Vmax:       1.02 m/s    Systemic VTI:  0.24 m MV Vmean:      52.5 cm/s   Systemic Diam: 1.90 cm MV Decel Time: 282 msec MV E velocity: 58.20 cm/s MV A velocity: 98.00 cm/s MV E/A ratio:  0.59 Dwayne D Callwood MD Electronically signed by Alwyn Pea MD Signature Date/Time: 09/15/2022/4:58:54 PM    Final       IMPRESSION AND PLAN:  Assessment and Plan: * Acute respiratory failure with hypoxia (HCC) - This is clearly secondary to fluid overload with ESRD on HD and acute on chronic diastolic CHF. - The patient will be admitted to stepdown unit bed. - The patient initially required BiPAP and was tapered to nasal cannula. - Nephrology consult to be obtained. - Dr. Thedore Mins was notified and is aware about the patient. - He will be planning urgent hemodialysis. - O2 protocol will be followed. - We will utilize IV Lasix for diuresis.  He does make small amount of urine and it should help with pulmonary congestion.  Acute on chronic diastolic CHF (congestive heart failure) (HCC) - Management as above.  Hyperkalemia - This was aggressively managed and will be followed. - He will have urgent hemodialysis as mentioned above.  Hypertensive urgency - We will continue IV nitroglycerin drip. - We will resume his antihypertensives. - This could be the main culprit for his acute CHF bedside noncompliance with hemodialysis yesterday.  ESRD on hemodialysis (HCC) - Management as above for current fluid overload and missing hemodialysis yesterday.  Coronary artery disease - We  will continue Imdur, Toprol-XL  and aspirin.  History of pulmonary embolism - We will continue Eliquis.   DVT prophylaxis: Eliquis. Advanced Care Planning:  Code Status: full code. Family Communication:  The plan of care was discussed in details with the patient (and family). I answered all questions. The patient agreed to proceed with the above mentioned plan. Further management will depend upon hospital course. Disposition Plan: Back to previous home environment Consults called: Nephrology. All the records are reviewed and case discussed with ED provider.  Status is: Inpatient  At the time of the admission, it appears that the appropriate admission status for this patient is inpatient.  This is judged to be reasonable and necessary in order to provide the required intensity of service to ensure the patient's safety given the presenting symptoms, physical exam findings and initial radiographic and laboratory data in the context of comorbid conditions.  The patient requires inpatient status due to high intensity of service, high risk of further deterioration and high frequency of surveillance required.  I certify that at the time of admission, it is my clinical judgment that the patient will require inpatient hospital care extending more than 2 midnights.                            Dispo: The patient is from: Home              Anticipated d/c is to: Home              Patient currently is not medically stable to d/c.              Difficult to place patient: No Authorized and performed by: Valente David, MD Total critical care time:   50     minutes. Due to a high probability of clinically significant, life-threatening deterioration, the patient required my highest level of preparedness to intervene emergently and I personally spent this critical care time directly and personally managing the patient.  This critical care time included obtaining a history, examining the patient, pulse oximetry, ordering and review of studies,  arranging urgent treatment with development of management plan, evaluation of patient's response to treatment, frequent reassessment, and discussions with other providers. This critical care time was performed to assess and manage the high probability of imminent, life-threatening deterioration that could result in multiorgan failure.  It was exclusive of separately billable procedures and treating other patients and teaching time.     Hannah Beat M.D on 09/17/2022 at 6:34 AM  [facility]   From 7 PM-7 AM, contact night-coverage [email or web address]  CC: Primary care physician; Ronnald Ramp, MD

## 2022-09-17 NOTE — TOC Initial Note (Addendum)
Transition of Care [facility]) - Initial/Assessment Note    Patient Details  Name: Anthony Hess MRN: [id number] Date of Birth: [birth year]  Transition of [facility]) CM/SW Contact:    Kreg Shropshire, RN Phone Number: 09/17/2022, 10:31 AM  Clinical Narrative:                 Cm received SNF consult for pt. Pt has had multiple admissions/ED visits in the last couple of months.   Pt lives at home with wife. During last ED visit he asked for housing resources due to the possibility of him and his wife getting separated.   Pt has PCP Dr. [name] and receives medication at pharmacy.  Cm contacted Bobbie Stack, Dialysis coordinator to update about pt admission  Pt confirmed that he was [place]. He lives at his house a few days out of the week  Cm gave pt [employer] that includes resources for housing, food, transportation, employment, and homeless shelters.   Pt also declined HH RN or SW to come to home to help with resources in the community.    Barriers to Discharge: Continued Medical Work up   Patient Goals and CMS Choice   CMS Medicare.gov Compare Post Acute Care list provided to:: Patient Choice offered to / list presented to : Patient      Expected Discharge Plan and Services       Living arrangements for the past 2 months: Single Family Home                                      Prior Living Arrangements/Services Living arrangements for the past 2 months: Single Family Home Lives with:: Self, Spouse Patient language and need for interpreter reviewed:: Yes Do you feel safe going back to the place where you live?: Yes        Care giver support system in place?: Yes (comment)      Activities of Daily Living      Permission Sought/Granted                  Emotional Assessment Appearance:: Appears stated age Attitude/Demeanor/Rapport: Engaged Affect (typically observed):  Calm Orientation: : Oriented to Self, Oriented to Place, Oriented to  Time, Oriented to Situation      Admission diagnosis:  Shortness of breath [R06.02] Hyperkalemia [E87.5] Acute pulmonary edema (HCC) [J81.0] Hypoxia [R09.02] ESRD (end stage renal disease) on dialysis (HCC) [N18.6, Z99.2] Hypertensive urgency [I16.0] Acute respiratory failure with hypoxia (HCC) [J96.01] Patient Active Problem List   Diagnosis Date Noted   ESRD on hemodialysis (HCC) 09/17/2022   Acute on chronic diastolic CHF (congestive heart failure) (HCC) 09/17/2022   History of pulmonary embolism 09/17/2022   Coronary artery disease 09/17/2022   Essential hypertension 09/17/2022   Acute respiratory failure with hypoxia (HCC) 09/06/2022   Fluid overload 09/06/2022   Hypertensive emergency 09/06/2022   HLD (hyperlipidemia) 09/06/2022   CAD (coronary artery disease) 09/06/2022   Pulmonary embolism (HCC) 09/06/2022   NSTEMI (non-ST elevated myocardial infarction) (HCC) 08/17/2022   (HFpEF) heart failure with preserved ejection fraction (HCC) 08/17/2022   Seasonal allergic rhinitis due to pollen 06/25/2022   Nausea 06/25/2022   Hoarseness of voice 06/25/2022   Establishing care with new doctor, encounter for 06/25/2022   S/P exploratory laparotomy for perforated duodenum with admission 04/26/22 05/04/2022  Postprocedural intraabdominal abscess 05/04/2022   Acute pulmonary embolism (HCC) 04/19/2022   Dyslipidemia 04/19/2022   End-stage renal disease on hemodialysis (HCC) 04/19/2022   Tobacco dependence 04/19/2022   Chronic systolic heart failure (HCC) 04/05/2022   Hypertensive urgency 11/16/2021   Hyperkalemia 11/16/2021   Chronic back pain 11/16/2021   Hypertension secondary to other renal disorders 10/15/2021   Vitamin B12 deficiency anemia due to intrinsic factor deficiency 06/23/2021   Abdominal pain    SBP (spontaneous bacterial peritonitis) (HCC) 04/08/2021   COVID-19 virus infection 04/08/2021   AAA  (abdominal aortic aneurysm) (HCC) 03/18/2021   Hypocalcemia    Leukocytosis 03/05/2021   Spontaneous bacterial peritonitis (HCC) 03/04/2021   Incisional hernia, without obstruction or gangrene    Generalized (acute) peritonitis (HCC) 12/10/2020   Iron deficiency anemia, unspecified 05/06/2020   Moderate protein-calorie malnutrition (HCC) 05/06/2020   Other long term (current) drug therapy 05/06/2020   Unspecified jaundice 05/06/2020   PD catheter dysfunction (HCC) 04/13/2020   Bilateral carotid artery stenosis 02/29/2020   Coronary artery disease involving coronary bypass graft of native heart 02/29/2020   Secondary hyperparathyroidism of renal origin (HCC) 01/30/2020   Acute peritonitis (HCC) 01/08/2020   Hyperlipidemia, mixed 01/04/2020   Mass of left side of neck 01/04/2020   Senile purpura (HCC) 01/04/2020   Hydroureteronephrosis    Atrial fibrillation, chronic (HCC) 01/05/2019   End stage renal disease (HCC) 01/05/2019   Anemia in chronic kidney disease (CODE) 01/05/2019   Presence of aortocoronary bypass graft 12/27/2018   Other emphysema (HCC) 04/14/2018   Bilateral hydronephrosis 04/03/2018   Hypertension 03/27/2018   Cigarette smoker 03/20/2018   History of bladder cancer 03/20/2018   PCP:  Ronnald Ramp, MD Pharmacy:   [facility] [address]), [place] - [address] [place]) [place] [address] Phone: [phone] Fax: [phone]     Social Determinants of Health (SDOH) Social History: SDOH Screenings   Food Insecurity: No Food Insecurity (09/15/2022)  Housing: Low Risk  (09/15/2022)  Transportation Needs: No Transportation Needs (09/15/2022)  Utilities: Not At Risk (09/15/2022)  Depression (PHQ2-9): Low Risk  (08/23/2022)  Financial Resource Strain: Low Risk  (08/23/2022)  Physical Activity: Inactive (08/23/2022)  Social Connections: Moderately Integrated (08/23/2022)  Stress: No Stress Concern Present  (08/23/2022)  Tobacco Use: High Risk (09/17/2022)   SDOH Interventions:     Readmission Risk Interventions    09/17/2022   10:26 AM 09/15/2022    9:08 AM 04/28/2022    2:26 PM  Readmission Risk Prevention Plan  Transportation Screening Complete Complete Complete  Medication Review Oceanographer) Complete Complete Complete  PCP or Specialist appointment within 3-5 days of discharge Complete Complete Complete  HRI or Home Care Consult Complete Complete Complete  SW Recovery Care/Counseling Consult Complete Complete Complete  Palliative Care Screening Not Applicable Not Applicable Not Applicable  Skilled Nursing Facility Complete Not Applicable Not Applicable

## 2022-09-17 NOTE — Discharge Instructions (Addendum)
Need assistance? Not sure where to start? No matter where you live in [place], you can call 2-1-1 and a trained 2-1-1 agent will help you to find available human services resources in your community.  If you need help finding assistance with housing, food, healthcare, utility payments, and more, we can help. 2-1-1 Referral Specialists are available 24 hours a day, every day by dialing 2-1-1 or [phone] from any phone. The call is free, confidential, and available in any language.  online 2-1-1 search tool, log on [email or web address]  Rent/Utility/Housing  Agency Name: [facility] Address: [address] [place], [place] [address] Phone: [phone] Email: [email or web address] Website: [email or web address] Service(s) Offered: Housing services, self-sufficiency, congregate meal program, weatherization program, Field seismologist program, emergency food assistance,  housing counseling, home ownership program, wheels -towork program.  Agency Name: [facility] Address: [address] [place], [place] [address] Phone: [phone] (8a-4p) [phone] (8p- 10p) Email: [email or web address] Website: [email or web address] Service(s) Offered: A program for homeless and/or needy men that includes one-on-one counseling, life skills training and job rehabilitation.  Agency Name: [facility] Address: [address], [place], [place] [address] Phone: [phone] Website: www.[email or web address] Service(s) Offered: Assistance to needy in emergency with utility bills, heating fuel, and prescriptions. Shelter for homeless 7pm-7am. June 17, 2016 15  Agency Name: [facility] [place] [facility]Development[facility] Disabled) Address: [address], [place], [place] [address] Phone: [phone] Contact Person: Cathleen Corti Email: [email or web address] Website: [email or web address] Service(s) Offered: Helps  individuals with developmental disabilities move from housing that is more restrictive to homes where they  can achieve greater independence and have more  opportunities.  Agency Name: [facility] Address: [address], [place], [place] [address] Phone: [phone] Email: [email or web address] Website: [email or web address].org Service(s) Offered: Provides affordable housing for low-income families, elderly, and disabled individuals. Offer a wide range of  programs and services, from financial planning to afterschool and summer programs.  Agency Name: [facility] [employer] [facility] Address: [address] [place], [place] [address] Phone: [phone] Service(s) Offered: Child support services; child welfare services; food stamps; Medicaid; work first family assistance; and aid with fuel,  rent, food and medicine.  Agency Name: [facility] [place] [employer] Address: [facility] [address], [place], [place]  [address] Phone: [phone] Website: www.familyabuseservices.org Service(s) Offered: 24 hour Crisis Line: [phone]; 24 hour Emergency Shelter; Transitional Housing; Support Groups; Scientist, physiological; [employer]; Hispanic Outreach: [phone];  Visitation Center: [phone].  Agency Name: [facility] [place]. Address: [address], [place], [place] [address] Phone: [phone] Service(s) Offered: CAP Services; Home and [employer]; Individual or Group Supports; Respite Care Non-Institutional Nursing;  Residential Supports; Respite Care and Personal Care Services; Transportation; Family and Friends Night; Recreational Activities; Three Nutritious Meals/Snacks; Consultation with Registered Dietician; Twenty-four hour Registered Nurse Access; Daily and [employer]; Camp [place]; [place] for the [employer] (During Summer Months) Bingo Night (Every  Wednesday Night); Special  Populations Dance Night  (Every Tuesday Night); Professional Hair Care Services.  Agency Name: [facility] Address: [address], [place], [place] [address] Phone: [phone] Contact Person: Jabier Mutton Website: [email or web address].Physicist, medical) Offered: Residential treatment facility for women; food and  clothing, educational & employment development and  transportation to work; Counsellor of financial skills;  parenting and family reunification; emotional and spiritual  support; transitional housing for program graduates.  Agency Name: [facility] Address: [address] [place], [place] [address] Phone: [phone]  Email: [email or web address] Website: [email or web address].es Service(s) Offered: Public housing units for elderly, disabled, and low income people; housing choice vouchers for income eligible  applicants; shelter plus care vouchers; and Psychologist, clinical.  Agency Name: [facility] [employer] Address: [address], [place], [place] [address] Phone: [phone] Email: [email or web address] Website: www.habitatalamance.org Service(s) Offered: Build houses for families in need of decent housing. Each adult in the family must invest 200 hours of labor on  someone else's house, work with volunteers to build their own house, attend classes on budgeting, home maintenance, yard care, and attend homeowner association meetings.  Agency Name: [facility] Address: [address] [place], [place] [address] Phone: [phone] Website: www.rsli.org Service(s) Offered: Intermediate care facilities for intellectually delayed, Supervised Living in group homes for adults with developmental disabilities, Supervised Living for people who have dual diagnoses (MRMI), Independent Living, Supported Living, respite and a variety of CAP services, pre-vocational services, day supports, and [employer].  Agency Name: [place] [employer] Phone: [phone] Website: www.NCForeclosurePrevention.gov Service(s) Offered: Zero-interest, deferred loans to homeowners struggling to pay their mortgage. Call for more information.

## 2022-09-17 NOTE — Discharge Summary (Signed)
Physician Discharge Summary  Anthony Hess HQI:[id number] DOB: Dec 23, 1955 DOA: 09/17/2022  PCP: Ronnald Ramp, MD  Admit date: 09/17/2022 Discharge date: 09/17/2022  Admitted From: Home Disposition:  Home  Recommendations for Outpatient Follow-up:  Follow up with PCP in 1-2 weeks   Home Health:No (declined services) Equipment/Devices:None   Discharge Condition:Stable  CODE STATUS:FULL  Diet recommendation: Renal  Brief/Interim Summary: Anthony Hess is a 67 y.o. male with medical history significant for aortic atherosclerosis, bladder cancer, coronary artery disease, s/p four-vessel CABG, end-stage renal disease on hemodialysis, PVD, hypertension, bilateral carotid artery stenosis, and dyslipidemia, who presented to the ER with acute onset of worsening dyspnea with significant respiratory distress.  The patient was [facility] last night and was expected to have HD but left AMA to go to work.  He was expected to go to [facility] but did not go for hemodialysis.  He has been experiencing chest tightness with dyspnea.  He admitted to dyspnea on exertion, paroxysmal nocturnal dyspnea and orthopnea lately.  No cough or wheezing or hemoptysis.  No fever or chills.  No nausea or vomiting or abdominal pain.  No headache or dizziness or blurred vision.   Was emergently dialyzed in the hospital.  Admitted to ICU/SD for nitroglycerin gtt for malignant hypertension.  BP improved after HD.  Weaned off gtt.  Stable for dc home.  Offered HH services, patient declined.    Discharge Diagnoses:  Principal Problem:   Acute respiratory failure with hypoxia (HCC) Active Problems:   Hypertensive urgency   Hyperkalemia   Acute on chronic diastolic CHF (congestive heart failure) (HCC)   ESRD on hemodialysis (HCC)   History of pulmonary embolism   Coronary artery disease  Hypoxia resolved.  Patient on RA at time of dc BP improved after nitroglycerin gtt and resumption of home meds post HD Discussed with  nephrology.  Patient stable for dc home  Discharge Instructions  Discharge Instructions     Diet - low sodium heart healthy   Complete by: As directed    Increase activity slowly   Complete by: As directed    No wound care   Complete by: As directed       Allergies as of 09/17/2022   No Known Allergies      Medication List     TAKE these medications    amLODipine 2.5 MG tablet Commonly known as: NORVASC Take 1 tablet (2.5 mg total) by mouth daily.   apixaban 5 MG Tabs tablet Commonly known as: ELIQUIS Take 1 tablet (5 mg total) by mouth 2 (two) times daily.   aspirin EC 81 MG tablet Take 81 mg by mouth daily.   calcitRIOL 0.5 MCG capsule Commonly known as: ROCALTROL Take 1 capsule (0.5 mcg total) by mouth daily.   calcium acetate 667 MG capsule Commonly known as: PHOSLO Take 1 capsule (667 mg total) by mouth 3 (three) times daily with meals.   isosorbide dinitrate 10 MG tablet Commonly known as: ISORDIL Take 1 tablet (10 mg total) by mouth 3 (three) times daily.   metoprolol succinate 25 MG 24 hr tablet Commonly known as: TOPROL-XL Take 25 mg by mouth daily.   ondansetron 4 MG disintegrating tablet Commonly known as: ZOFRAN-ODT Take 1 tablet (4 mg total) by mouth every 8 (eight) hours as needed for nausea or vomiting.   pantoprazole 40 MG tablet Commonly known as: Protonix Take 1 tablet (40 mg total) by mouth daily.        No Known Allergies  Consultations: Nephrology  Procedures/Studies: DG Chest Port 1 View  Result Date: 09/17/2022 CLINICAL DATA:  Shortness of breath EXAM: PORTABLE CHEST 1 VIEW COMPARISON:  09/15/2022 FINDINGS: Prior CABG. Right Ellis catheter remains in place, unchanged. Mild cardiomegaly, vascular congestion. No confluent opacities, effusions or overt edema. No acute bony abnormality. IMPRESSION: Mild cardiomegaly, vascular congestion. Electronically Signed   By: Charlett Nose M.D.   On: 09/17/2022 03:32   DG Chest Port 1  View  Result Date: 09/15/2022 CLINICAL DATA:  Shortness of breath EXAM: PORTABLE CHEST 1 VIEW COMPARISON:  Chest x-ray 09/14/2022 FINDINGS: Right-sided central venous catheter tip projects over the distal SVC. Sternotomy wires are again noted. The cardiomediastinal silhouette appears unchanged and within normal limits. There is no focal lung infiltrate, pleural effusion or pneumothorax. There is mild curvature of the thoracic spine, similar to prior. IMPRESSION: No active disease. Electronically Signed   By: Darliss Cheney M.D.   On: 09/15/2022 23:44   ECHOCARDIOGRAM COMPLETE  Result Date: 09/15/2022    ECHOCARDIOGRAM REPORT   Patient Name:   Anthony Hess Date of Exam: 09/15/2022 Medical Rec #:  [id number]    Height:       62.0 in Accession #:    [id number]   Weight:       117.7 lb Date of Birth:  10-19-1955   BSA:          1.526 m Patient Age:    66 years     BP:           146/106 mmHg Patient Gender: M            HR:           75 bpm. Exam Location:  [facility] Procedure: 2D Echo, Cardiac Doppler and Color Doppler Indications:     Chest pain  History:         Patient has prior history of Echocardiogram examinations, most                  recent 04/19/2022. CHF, CAD, Acute MI and Previous Myocardial                  Infarction, Prior CABG, Arrythmias:Atrial Fibrillation,                  Signs/Symptoms:Chest Pain; Risk Factors:Hypertension,                  Dyslipidemia and Current Smoker. AAA, Pulmonary embolus, ESRD                  on dialysis.  Sonographer:     Mikki Harbor Referring Phys:  [id number] LILY MICHELLE TANG Diagnosing Phys: Alwyn Pea MD IMPRESSIONS  1. Left ventricular ejection fraction, by estimation, is 55 to 60%. The left ventricle has normal function. The left ventricle has no regional wall motion abnormalities. The left ventricular internal cavity size was mildly dilated. Left ventricular diastolic parameters are consistent with Grade I diastolic dysfunction (impaired relaxation).  2.  Right ventricular systolic function is normal. The right ventricular size is normal. There is normal pulmonary artery systolic pressure.  3. The mitral valve is grossly normal. Trivial mitral valve regurgitation.  4. The aortic valve is abnormal. Aortic valve regurgitation is not visualized. Aortic valve sclerosis/calcification is present, without any evidence of aortic stenosis. FINDINGS  Left Ventricle: Left ventricular ejection fraction, by estimation, is 55 to 60%. The left ventricle has normal function. The left ventricle has no regional wall motion abnormalities. The left ventricular  internal cavity size was mildly dilated. There is  borderline concentric left ventricular hypertrophy. Left ventricular diastolic parameters are consistent with Grade I diastolic dysfunction (impaired relaxation). Right Ventricle: The right ventricular size is normal. No increase in right ventricular wall thickness. Right ventricular systolic function is normal. There is normal pulmonary artery systolic pressure. The tricuspid regurgitant velocity is 2.52 m/s, and  with an assumed right atrial pressure of 3 mmHg, the estimated right ventricular systolic pressure is 28.4 mmHg. Left Atrium: Left atrial size was normal in size. Right Atrium: Right atrial size was normal in size. Pericardium: There is no evidence of pericardial effusion. Mitral Valve: The mitral valve is grossly normal. There is mild thickening of the mitral valve leaflet(s). There is mild calcification of the mitral valve leaflet(s). Mildly decreased mobility of the mitral valve leaflets. Trivial mitral valve regurgitation. MV peak gradient, 4.2 mmHg. The mean mitral valve gradient is 1.0 mmHg. Tricuspid Valve: The tricuspid valve is normal in structure. Tricuspid valve regurgitation is trivial. Aortic Valve: The aortic valve is abnormal. Aortic valve regurgitation is not visualized. Aortic valve sclerosis/calcification is present, without any evidence of aortic  stenosis. Aortic valve mean gradient measures 6.0 mmHg. Aortic valve peak gradient measures 14.5 mmHg. Aortic valve area, by VTI measures 2.26 cm. Pulmonic Valve: The pulmonic valve was normal in structure. Pulmonic valve regurgitation is not visualized. Aorta: The ascending aorta was not well visualized. IAS/Shunts: No atrial level shunt detected by color flow Doppler.  LEFT VENTRICLE PLAX 2D LVIDd:         5.10 cm   Diastology LVIDs:         3.60 cm   LV e' medial:    5.77 cm/s LV PW:         1.10 cm   LV E/e' medial:  10.1 LV IVS:        1.40 cm   LV e' lateral:   9.36 cm/s LVOT diam:     1.90 cm   LV E/e' lateral: 6.2 LV SV:         69 LV SV Index:   45 LVOT Area:     2.84 cm  RIGHT VENTRICLE RV Basal diam:  3.20 cm RV Mid diam:    3.40 cm RV S prime:     7.07 cm/s LEFT ATRIUM             Index        RIGHT ATRIUM           Index LA diam:        4.10 cm 2.69 cm/m   RA Area:     14.90 cm LA Vol (A2C):   59.7 ml 39.12 ml/m  RA Volume:   35.30 ml  23.13 ml/m LA Vol (A4C):   47.3 ml 30.99 ml/m LA Biplane Vol: 54.5 ml 35.71 ml/m  AORTIC VALVE                     PULMONIC VALVE AV Area (Vmax):    2.07 cm      PV Vmax:       1.28 m/s AV Area (Vmean):   2.17 cm      PV Peak grad:  6.6 mmHg AV Area (VTI):     2.26 cm AV Vmax:           190.50 cm/s AV Vmean:          105.450 cm/s AV VTI:  0.303 m AV Peak Grad:      14.5 mmHg AV Mean Grad:      6.0 mmHg LVOT Vmax:         139.00 cm/s LVOT Vmean:        80.700 cm/s LVOT VTI:          0.242 m LVOT/AV VTI ratio: 0.80  AORTA Ao Root diam: 3.50 cm MITRAL VALVE               TRICUSPID VALVE MV Area (PHT): 2.69 cm    TR Peak grad:   25.4 mmHg MV Area VTI:   3.48 cm    TR Vmax:        252.00 cm/s MV Peak grad:  4.2 mmHg MV Mean grad:  1.0 mmHg    SHUNTS MV Vmax:       1.02 m/s    Systemic VTI:  0.24 m MV Vmean:      52.5 cm/s   Systemic Diam: 1.90 cm MV Decel Time: 282 msec MV E velocity: 58.20 cm/s MV A velocity: 98.00 cm/s MV E/A ratio:  0.59 Dwayne D  Callwood MD Electronically signed by Alwyn Pea MD Signature Date/Time: 09/15/2022/4:58:54 PM    Final    US Abdomen Limited RUQ (LIVER/GB)  Result Date: 09/14/2022 CLINICAL DATA:  Right upper quadrant pain EXAM: ULTRASOUND ABDOMEN LIMITED RIGHT UPPER QUADRANT COMPARISON:  CT 09/14/2022 FINDINGS: Gallbladder: Distended gallbladder with layering stones. No wall thickening or adjacent fluid. Common bile duct: Diameter: 4 mm Liver: Heterogeneous parenchyma with some echogenic liver lesions identified such as right hepatic lobe superiorly measuring 2.1 x 1.3 x 3.0 cm. Smaller focus seen elsewhere measures 15 mm. Portal vein is patent on color Doppler imaging with normal direction of blood flow towards the liver. Other: Small amount of ascites. IMPRESSION: Distended gallbladder with layering stones. No wall thickening. No biliary ductal dilatation. No further sonographic evidence of acute cholecystitis. Echogenic liver lesions identified of uncertain etiology. Please correlate for any known prior workup. Electronically Signed   By: Karen Kays M.D.   On: 09/14/2022 11:14   US Venous Img Lower Bilateral (DVT)  Result Date: 09/14/2022 CLINICAL DATA:  67 year old male with lower extremity pain EXAM: BILATERAL LOWER EXTREMITY VENOUS DOPPLER ULTRASOUND TECHNIQUE: Gray-scale sonography with graded compression, as well as color Doppler and duplex ultrasound were performed to evaluate the lower extremity deep venous systems from the level of the common femoral vein and including the common femoral, femoral, profunda femoral, popliteal and calf veins including the posterior tibial, peroneal and gastrocnemius veins when visible. The superficial great saphenous vein was also interrogated. Spectral Doppler was utilized to evaluate flow at rest and with distal augmentation maneuvers in the common femoral, femoral and popliteal veins. COMPARISON:  None Available. FINDINGS: RIGHT LOWER EXTREMITY Common Femoral Vein: No  evidence of thrombus. Normal compressibility, respiratory phasicity and response to augmentation. Saphenofemoral Junction: No evidence of thrombus. Normal compressibility and flow on color Doppler imaging. Profunda Femoral Vein: No evidence of thrombus. Normal compressibility and flow on color Doppler imaging. Femoral Vein: No evidence of thrombus. Normal compressibility, respiratory phasicity and response to augmentation. Popliteal Vein: No evidence of thrombus. Normal compressibility, respiratory phasicity and response to augmentation. Calf Veins: No evidence of thrombus. Normal compressibility and flow on color Doppler imaging. Superficial Great Saphenous Vein: No evidence of thrombus. Normal compressibility and flow on color Doppler imaging. Other Findings:  None. LEFT LOWER EXTREMITY Common Femoral Vein: No evidence of thrombus. Normal compressibility, respiratory  phasicity and response to augmentation. Saphenofemoral Junction: No evidence of thrombus. Normal compressibility and flow on color Doppler imaging. Profunda Femoral Vein: No evidence of thrombus. Normal compressibility and flow on color Doppler imaging. Femoral Vein: No evidence of thrombus. Normal compressibility, respiratory phasicity and response to augmentation. Popliteal Vein: No evidence of thrombus. Normal compressibility, respiratory phasicity and response to augmentation. Calf Veins: No evidence of thrombus. Normal compressibility and flow on color Doppler imaging. Superficial Great Saphenous Vein: No evidence of thrombus. Normal compressibility and flow on color Doppler imaging. Other Findings:  None. IMPRESSION: Directed duplex of the bilateral lower extremity negative for DVT Signed, Yvone Neu. Miachel Roux, RPVI [facility] Electronically Signed   By: Gilmer Mor D.O.   On: 09/14/2022 10:34   CT ABDOMEN PELVIS WO CONTRAST  Result Date: 09/14/2022 CLINICAL DATA:   67 year old male dialysis patient with shortness of breath. Periumbilical abdominal pain. EXAM: CT ABDOMEN AND PELVIS WITHOUT CONTRAST TECHNIQUE: Multidetector CT imaging of the abdomen and pelvis was performed following the standard protocol without IV contrast. RADIATION DOSE REDUCTION: This exam was performed according to the departmental dose-optimization program which includes automated exposure control, adjustment of the mA and/or kV according to patient size and/or use of iterative reconstruction technique. COMPARISON:  Portable chest x-ray 0404 hours today. CT Abdomen and Pelvis 712 24. FINDINGS: Lower chest: Mild respiratory motion and atelectasis. Trace layering left pleural effusion. No pericardial effusion. No confluent lung opacity. Hepatobiliary: Trace perihepatic free fluid is possible on series 2, image 17. But otherwise the noncontrast liver appears stable with multiple benign liver hemangiomas and cysts better characterized on the contrast enhanced study earlier this month (no follow-up imaging recommended). Small layering gallstones are now evident on series 2, image 31. No convincing pericholecystic inflammation. Pancreas: Negative noncontrast appearance. Spleen: Negative. Adrenals/Urinary Tract: Stable adrenal glands and native renal atrophy. Decompressed urinary bladder. Stomach/Bowel: Similar fluid-filled but nondilated small bowel loops in much of the abdomen and pelvis. Normal appendix identified on coronal image 48. Similar large bowel retained stool. Stomach is mostly decompressed. No free air or discrete bowel inflammation identified in the absence of contrast. Vascular/Lymphatic: Severe calcified atherosclerosis. Extensive Aortoiliac calcified atherosclerosis. Stable mild tortuosity of the abdominal aorta. No lymphadenopathy identified. Reproductive: Negative. Other: No pelvis free fluid. Musculoskeletal: Scoliosis and mild lumbar spondylolisthesis. Previous right femur intramedullary  rod removal. Stable visualized osseous structures. IMPRESSION: 1. Trace layering pleural fluid and perihepatic ascites, nonspecific. 2. Cholelithiasis. No noncontrast CT evidence of acute cholecystitis, but if there is right upper quadrant abdominal pain ultrasound would be valuable. 3. No other acute or inflammatory process identified in the noncontrast abdomen or pelvis. 4. Severe calcified atherosclerosis. Aortic Atherosclerosis (ICD10-I70.0). Electronically Signed   By: Odessa Fleming M.D.   On: 09/14/2022 05:44   DG Chest Portable 1 View  Result Date: 09/14/2022 CLINICAL DATA:  67 year old male dialysis patient with shortness of breath x1 hour. EXAM: PORTABLE CHEST 1 VIEW COMPARISON:  Portable chest 09/06/2022 and earlier. FINDINGS: Portable AP upright view at 0404 hours. Stable right chest dual lumen dialysis type catheter. Prior CABG. Stable cardiac size and mediastinal contours. Visualized tracheal air column is within normal limits. Stable lung volumes. No pneumothorax, pleural effusion or consolidation. Regressed streaky perihilar opacity and Kerley B-lines seen earlier this month. No areas of worsening ventilation. Dextroconvex thoracic scoliosis. No acute osseous abnormality identified. Negative visible bowel gas. IMPRESSION: No acute cardiopulmonary abnormality. Electronically Signed   By: Althea Grimmer.D.  On: 09/14/2022 04:10   DG Chest Port 1 View  Result Date: 09/06/2022 CLINICAL DATA:  67 year old male with possible sepsis. Missed dialysis over the weekend. EXAM: PORTABLE CHEST 1 VIEW COMPARISON:  Chest radiograph 08/17/2022 and earlier. FINDINGS: Portable AP upright view at 0509 hours. Stable right chest dual lumen dialysis type catheter. Chronic sternotomy, CABG. Dextroconvex scoliosis. Patchy and indistinct acute left perihilar and mid lung opacity, but also evidence of Kerley B lines bilaterally. Stable mediastinal contours when allowing for portable technique. No pneumothorax. No air  bronchograms. No pleural effusion is evident. Negative visible bowel gas. No acute osseous abnormality identified. IMPRESSION: 1. Acute new indistinct left perihilar opacity, but bilateral Kerley B lines also visible. Favor asymmetric acute pulmonary edema. Superimposed left lung aspiration or infection difficult to exclude. 2. Stable right chest dialysis catheter. Chronic sternotomy, CABG. Electronically Signed   By: Odessa Fleming M.D.   On: 09/06/2022 05:29   US Venous Img Lower Unilateral Right  Result Date: 09/03/2022 CLINICAL DATA:  67 year old male End stage renal disease patient with lower abdominal pain, left lower quadrant and right leg pain. Reportedly has a lower extremity DVT. EXAM: RIGHT LOWER EXTREMITY VENOUS DOPPLER ULTRASOUND TECHNIQUE: Gray-scale sonography with compression, as well as color and duplex ultrasound, were performed to evaluate the deep venous system(s) from the level of the common femoral vein through the popliteal and proximal calf veins. A COMPARISON:  CT Abdomen and Pelvis today reported separately. Bilateral lower extremity venous Doppler 04/20/2022. FINDINGS: Discussion via PRA with the technologist at 0523 hours confirms that the right lower extremity was imaged with Doppler ultrasound, although was inadvertently labeled "LT" throughout the series 1 images. The technologist did send corrected labels for this exam on series 2 images. VENOUS Normal compressibility of the common femoral, superficial femoral, and popliteal veins, as well as the visualized calf veins. Visualized portions of profunda femoral vein and great saphenous vein unremarkable. No filling defects to suggest DVT on grayscale or color Doppler imaging. Doppler waveforms show normal direction of venous flow, normal respiratory plasticity and response to augmentation. Limited views of the contralateral common femoral vein are unremarkable. OTHER None. Limitations: none IMPRESSION: No evidence of right lower extremity  deep venous thrombosis. Electronically Signed   By: Odessa Fleming M.D.   On: 09/03/2022 05:30   CT ABDOMEN PELVIS W CONTRAST  Result Date: 09/03/2022 CLINICAL DATA:  67 year old male End stage renal disease patient with lower abdominal pain, left lower quadrant and right leg pain. Reportedly has a lower extremity DVT. EXAM: CT ABDOMEN AND PELVIS WITH CONTRAST TECHNIQUE: Multidetector CT imaging of the abdomen and pelvis was performed using the standard protocol following bolus administration of intravenous contrast. RADIATION DOSE REDUCTION: This exam was performed according to the departmental dose-optimization program which includes automated exposure control, adjustment of the mA and/or kV according to patient size and/or use of iterative reconstruction technique. CONTRAST:  OMNIPAQUE IOHEXOL 300 MG/ML  SOLN COMPARISON:  CT Abdomen and Pelvis 08/10/2022. FINDINGS: Lower chest: Negative. Hepatobiliary: Stable liver with multiple benign hemangiomas and cysts (no follow-up imaging recommended). More decompressed gallbladder today. Pancreas: Negative. Spleen: Negative. Adrenals/Urinary Tract: Normal adrenal glands. Renal atrophy again noted. Decompressed bladder. Stomach/Bowel: Gas distended rectum. Retained stool in the upstream colon but no other dilated large or small bowel. Paucity of peritoneal fat. Decompressed stomach. No free air or free fluid. Evidence of a normal gas containing appendix coronal images 42 through 51. No discrete bowel inflammation is identified. Vascular/Lymphatic: Severe Aortoiliac calcified atherosclerosis.  Tortuous abdominal aorta, no discrete aortic aneurysm. Visible major arterial structures remain patent. There is no venous contrast for evaluation, but the portal venous system appears to be patent. No lymphadenopathy identified. Reproductive: Negative. Other: No pelvis free fluid. Musculoskeletal: Moderate thoracolumbar scoliosis. Evidence of previous right femur intramedullary  rod, chronically removed. No acute osseous abnormality identified. IMPRESSION: 1. No acute or inflammatory process identified in the abdomen or pelvis. 2. Normal appendix. Large bowel retained stool. Chronic renal atrophy. Advanced Aortic Atherosclerosis (ICD10-I70.0). Electronically Signed   By: Odessa Fleming M.D.   On: 09/03/2022 05:15      Subjective: Seen and examined at time of discharge.  Stable, no distress.  BP improved.  On room air Stable for discharge home  Discharge Exam: Vitals:   09/17/22 1700 09/17/22 1730  BP: 125/85 117/68  Pulse: 78 78  Resp: 18 12  Temp:    SpO2: 96% 97%   Vitals:   09/17/22 1630 09/17/22 1648 09/17/22 1700 09/17/22 1730  BP: (!) 131/105 (!) 165/103 125/85 117/68  Pulse: 75  78 78  Resp: 18  18 12   Temp:      TempSrc:      SpO2: 97%  96% 97%  Weight:      Height:        General: Pt is alert, awake, not in acute distress Cardiovascular: RRR, S1/S2 +, no rubs, no gallops Respiratory: CTA bilaterally, no wheezing, no rhonchi Abdominal: Soft, NT, ND, bowel sounds + Extremities: no edema, no cyanosis    The results of significant diagnostics from this hospitalization (including imaging, microbiology, ancillary and laboratory) are listed below for reference.     Microbiology: Recent Results (from the past 240 hour(s))  SARS Coronavirus 2 by RT PCR (hospital order, performed in [facility] hospital lab) *cepheid single result test* Anterior Nasal Swab     Status: None   Collection Time: 09/14/22  3:58 AM   Specimen: Anterior Nasal Swab  Result Value Ref Range Status   SARS Coronavirus 2 by RT PCR NEGATIVE NEGATIVE Final    Comment: (NOTE) SARS-CoV-2 target nucleic acids are NOT DETECTED.  The SARS-CoV-2 RNA is generally detectable in upper and lower respiratory specimens during the acute phase of infection. The lowest concentration of SARS-CoV-2 viral copies this assay can detect is 250 copies / mL. A negative result does not preclude  SARS-CoV-2 infection and should not be used as the sole basis for treatment or other patient management decisions.  A negative result may occur with improper specimen collection / handling, submission of specimen other than nasopharyngeal swab, presence of viral mutation(s) within the areas targeted by this assay, and inadequate number of viral copies (<250 copies / mL). A negative result must be combined with clinical observations, patient history, and epidemiological information.  Fact Sheet for Patients:   RoadLapTop.co.za  Fact Sheet for Healthcare Providers: http://kim-miller.com/  This test is not yet approved or  cleared by the [place] FDA and has been authorized for detection and/or diagnosis of SARS-CoV-2 by FDA under an Emergency Use Authorization (EUA).  This EUA will remain in effect (meaning this test can be used) for the duration of the COVID-19 declaration under Section 564(b)(1) of the Act, 21 U.S.C. section 360bbb-3(b)(1), unless the authorization is terminated or revoked sooner.  Performed at [facility], [address] [place], [place] [address]   MRSA Next Gen by PCR, Nasal     Status: None   Collection Time: 09/17/22  6:18 AM  Specimen: Nasal Mucosa; Nasal Swab  Result Value Ref Range Status   MRSA by PCR Next Gen NOT DETECTED NOT DETECTED Final    Comment: (NOTE) The GeneXpert MRSA Assay (FDA approved for NASAL specimens only), is one component of a comprehensive MRSA colonization surveillance program. It is not intended to diagnose MRSA infection nor to guide or monitor treatment for MRSA infections. Test performance is not FDA approved in patients less than 23 years old. Performed at [facility], [address] [place], [place] [address]      Labs: BNP (last 3 results) Recent Labs    04/18/22 2237 09/06/22 0516 09/17/22 0317  BNP 843.6* >4,500.0* 3,873.0*   Basic  Metabolic Panel: Recent Labs  Lab 09/14/22 0358 09/14/22 0810 09/14/22 1530 09/15/22 0631 09/15/22 1034 09/15/22 2320 09/17/22 0317 09/17/22 1053  NA 139 139  --   --   --  140 140 136  K 6.0* 4.5   < > 4.4 4.5 4.6 6.8* 3.5  CL 107 106  --   --   --  102 103 96*  CO2 18* 18*  --   --   --  19* 19* 26  GLUCOSE 96 62*  --   --   --  104* 87 99  BUN 83* 84*  --   --   --  69* 96* 55*  CREATININE 8.83* 8.73*  --   --   --  8.55* 9.93* 5.40*  CALCIUM 6.6* 6.5*  --   --   --  6.8* 6.5* 7.4*  PHOS  --   --   --   --   --   --   --  3.4   < > = values in this interval not displayed.   Liver Function Tests: Recent Labs  Lab 09/14/22 0358 09/15/22 2320 09/17/22 0317 09/17/22 1053  AST 28 14* 36  --   ALT 34 19 37  --   ALKPHOS 59 50 60  --   BILITOT 0.3 0.3 0.9  --   PROT 7.0 6.1* 7.5  --   ALBUMIN 3.6 2.9* 3.5 3.1*   Recent Labs  Lab 09/15/22 2320  LIPASE 60*   No results for input(s): "AMMONIA" in the last 168 hours. CBC: Recent Labs  Lab 09/14/22 0358 09/15/22 0631 09/15/22 2320 09/17/22 0317  WBC 15.3* 8.9 9.5 12.7*  NEUTROABS 12.2*  --  6.9 9.1*  HGB 10.7* 10.3* 10.0* 10.8*  HCT 34.5* 33.3* 33.4* 34.4*  MCV 90.8 90.0 91.5 90.3  PLT 335 324 325 377   Cardiac Enzymes: No results for input(s): "CKTOTAL", "CKMB", "CKMBINDEX", "TROPONINI" in the last 168 hours. BNP: Invalid input(s): "POCBNP" CBG: Recent Labs  Lab 09/14/22 0921 09/17/22 0419 09/17/22 0503 09/17/22 0618 09/17/22 0704  GLUCAP 155* 87 120* 54* 118*   D-Dimer No results for input(s): "DDIMER" in the last 72 hours. Hgb A1c No results for input(s): "HGBA1C" in the last 72 hours. Lipid Profile Recent Labs    09/15/22 0631  CHOL 170  HDL 44  LDLCALC 106*  TRIG 99  CHOLHDL 3.9   Thyroid function studies No results for input(s): "TSH", "T4TOTAL", "T3FREE", "THYROIDAB" in the last 72 hours.  Invalid input(s): "FREET3" Anemia work up No results for input(s): "VITAMINB12", "FOLATE",  "FERRITIN", "TIBC", "IRON", "RETICCTPCT" in the last 72 hours. Urinalysis    Component Value Date/Time   COLORURINE YELLOW (A) 08/10/2022 1634   APPEARANCEUR TURBID (A) 08/10/2022 1634   APPEARANCEUR Cloudy (A) 05/08/2019 1448  LABSPEC 1.017 08/10/2022 1634   PHURINE 7.0 08/10/2022 1634   GLUCOSEU NEGATIVE 08/10/2022 1634   HGBUR SMALL (A) 08/10/2022 1634   BILIRUBINUR NEGATIVE 08/10/2022 1634   BILIRUBINUR Negative 05/08/2019 1448   KETONESUR NEGATIVE 08/10/2022 1634   PROTEINUR >=300 (A) 08/10/2022 1634   NITRITE NEGATIVE 08/10/2022 1634   LEUKOCYTESUR MODERATE (A) 08/10/2022 1634   Sepsis Labs Recent Labs  Lab 09/14/22 0358 09/15/22 0631 09/15/22 2320 09/17/22 0317  WBC 15.3* 8.9 9.5 12.7*   Microbiology Recent Results (from the past 240 hour(s))  SARS Coronavirus 2 by RT PCR (hospital order, performed in [facility] hospital lab) *cepheid single result test* Anterior Nasal Swab     Status: None   Collection Time: 09/14/22  3:58 AM   Specimen: Anterior Nasal Swab  Result Value Ref Range Status   SARS Coronavirus 2 by RT PCR NEGATIVE NEGATIVE Final    Comment: (NOTE) SARS-CoV-2 target nucleic acids are NOT DETECTED.  The SARS-CoV-2 RNA is generally detectable in upper and lower respiratory specimens during the acute phase of infection. The lowest concentration of SARS-CoV-2 viral copies this assay can detect is 250 copies / mL. A negative result does not preclude SARS-CoV-2 infection and should not be used as the sole basis for treatment or other patient management decisions.  A negative result may occur with improper specimen collection / handling, submission of specimen other than nasopharyngeal swab, presence of viral mutation(s) within the areas targeted by this assay, and inadequate number of viral copies (<250 copies / mL). A negative result must be combined with clinical observations, patient history, and epidemiological information.  Fact Sheet for  Patients:   RoadLapTop.co.za  Fact Sheet for Healthcare Providers: http://kim-miller.com/  This test is not yet approved or  cleared by the [place] FDA and has been authorized for detection and/or diagnosis of SARS-CoV-2 by FDA under an Emergency Use Authorization (EUA).  This EUA will remain in effect (meaning this test can be used) for the duration of the COVID-19 declaration under Section 564(b)(1) of the Act, 21 U.S.C. section 360bbb-3(b)(1), unless the authorization is terminated or revoked sooner.  Performed at [facility], [address] [place], [place] [address]   MRSA Next Gen by PCR, Nasal     Status: None   Collection Time: 09/17/22  6:18 AM   Specimen: Nasal Mucosa; Nasal Swab  Result Value Ref Range Status   MRSA by PCR Next Gen NOT DETECTED NOT DETECTED Final    Comment: (NOTE) The GeneXpert MRSA Assay (FDA approved for NASAL specimens only), is one component of a comprehensive MRSA colonization surveillance program. It is not intended to diagnose MRSA infection nor to guide or monitor treatment for MRSA infections. Test performance is not FDA approved in patients less than 54 years old. Performed at [facility], [address], [place], [place] [address]      Time coordinating discharge: Over 30 minutes  SIGNED:   Tresa Moore, MD  [facility] 09/17/2022, 6:40 PM Pager   If 7PM-7AM, please contact night-coverage

## 2022-09-17 NOTE — Progress Notes (Signed)
Hemodialysis note  Received patient in bed. Alert and oriented.  Informed consent signed and in chart.  Treatment initiated: 0906 Treatment completed: 1250  Patient tolerated well, alert without acute distress.  Report given to patient's RN.   Access used: Right Chest HD PermCath Access issues: none  Total UF removed: 2.5 L Medication(s) given:  none  Post HD weight: 52.7 kg   [facility]

## 2022-09-17 NOTE — Progress Notes (Signed)
Pt ws d/c at this time. Pts v/s are stable and pt has no c/o pain or discomfort noted or stated. Pts d/c instructions were given to him and pt had understanding of instructions.

## 2022-09-20 ENCOUNTER — Emergency Department
Admission: EM | Admit: 2022-09-20 | Discharge: 2022-09-20 | Disposition: A | Discharge status: Home / Self Care | Payer: Medicare Other | Attending: Emergency Medicine | Admitting: Emergency Medicine

## 2022-09-20 ENCOUNTER — Emergency Department: Payer: Medicare Other

## 2022-09-20 ENCOUNTER — Other Ambulatory Visit: Payer: Self-pay

## 2022-09-20 DIAGNOSIS — R0600 Dyspnea, unspecified: Secondary | ICD-10-CM | POA: Insufficient documentation

## 2022-09-20 DIAGNOSIS — D72829 Elevated white blood cell count, unspecified: Secondary | ICD-10-CM | POA: Diagnosis not present

## 2022-09-20 DIAGNOSIS — I120 Hypertensive chronic kidney disease with stage 5 chronic kidney disease or end stage renal disease: Secondary | ICD-10-CM | POA: Diagnosis not present

## 2022-09-20 DIAGNOSIS — Z992 Dependence on renal dialysis: Secondary | ICD-10-CM | POA: Insufficient documentation

## 2022-09-20 DIAGNOSIS — N186 End stage renal disease: Secondary | ICD-10-CM | POA: Diagnosis not present

## 2022-09-20 DIAGNOSIS — R109 Unspecified abdominal pain: Secondary | ICD-10-CM | POA: Diagnosis present

## 2022-09-20 DIAGNOSIS — R1084 Generalized abdominal pain: Secondary | ICD-10-CM | POA: Insufficient documentation

## 2022-09-20 LAB — CBC
HCT: 33.7 % — ABNORMAL LOW (ref 39.0–52.0)
Hemoglobin: 10.3 g/dL — ABNORMAL LOW (ref 13.0–17.0)
MCH: 28 pg (ref 26.0–34.0)
MCHC: 30.6 g/dL (ref 30.0–36.0)
MCV: 91.6 fL (ref 80.0–100.0)
Platelets: 327 10*3/uL (ref 150–400)
RBC: 3.68 MIL/uL — ABNORMAL LOW (ref 4.22–5.81)
RDW: 15.9 % — ABNORMAL HIGH (ref 11.5–15.5)
WBC: 11 10*3/uL — ABNORMAL HIGH (ref 4.0–10.5)
nRBC: 0 % (ref 0.0–0.2)

## 2022-09-20 LAB — COMPREHENSIVE METABOLIC PANEL
ALT: 19 U/L (ref 0–44)
AST: 13 U/L — ABNORMAL LOW (ref 15–41)
Albumin: 3.5 g/dL (ref 3.5–5.0)
Alkaline Phosphatase: 56 U/L (ref 38–126)
Anion gap: 14 (ref 5–15)
BUN: 50 mg/dL — ABNORMAL HIGH (ref 8–23)
CO2: 23 mmol/L (ref 22–32)
Calcium: 8.1 mg/dL — ABNORMAL LOW (ref 8.9–10.3)
Chloride: 102 mmol/L (ref 98–111)
Creatinine, Ser: 7.36 mg/dL — ABNORMAL HIGH (ref 0.61–1.24)
GFR, Estimated: 8 mL/min — ABNORMAL LOW (ref >60)
Glucose, Bld: 119 mg/dL — ABNORMAL HIGH (ref 70–99)
Potassium: 4.8 mmol/L (ref 3.5–5.1)
Sodium: 139 mmol/L (ref 135–145)
Total Bilirubin: 0.5 mg/dL (ref 0.3–1.2)
Total Protein: 7 g/dL (ref 6.5–8.1)

## 2022-09-20 LAB — TROPONIN I (HIGH SENSITIVITY)
Troponin I (High Sensitivity): 102 ng/L (ref <18)
Troponin I (High Sensitivity): 107 ng/L (ref <18)

## 2022-09-20 LAB — LIPASE, BLOOD: Lipase: 49 U/L (ref 11–51)

## 2022-09-20 NOTE — ED Notes (Signed)
MD notified of critical troponin level.

## 2022-09-20 NOTE — ED Triage Notes (Signed)
[facility]. C/o abdominal pain for 1 week, associated with SOB. Lung sounds are clear. No change in urinary/bowel habits, no n/v/d. 154/82, 95%, 72 NSR.

## 2022-09-20 NOTE — Telephone Encounter (Signed)
Neurology referral was not placed by me. Will wait to follow up with patient for further information

## 2022-09-20 NOTE — ED Provider Notes (Signed)
[facility] Provider Note    Event Date/Time   First MD Initiated Contact with Patient 09/20/22 [phone]     (approximate)   History   Abdominal Pain   HPI Anthony Hess is a 67 y.o. male whose medical history is notable for end-stage renal disease on hemodialysis who states that he is here for shortness of breath and some persistent abdominal pain.  He said the abdominal pain has been going on for weeks and is intermittent and dull.  He occasionally has nausea and vomiting but has not had any tonight.  He said the shortness of breath started within the last few hours and he is surprised because he just had dialysis yesterday.  He wanted to make sure everything was okay.  He is otherwise well, no distress, no recent fever, denies chest pain.     Physical Exam   Triage Vital Signs: ED Triage Vitals  Encounter Vitals Group     BP 09/20/22 0032 121/86     Systolic BP Percentile --      Diastolic BP Percentile --      Pulse Rate 09/20/22 0032 72     Resp 09/20/22 0032 18     Temp 09/20/22 0032 98 F (36.7 C)     Temp src --      SpO2 09/20/22 0032 97 %     Weight --      Height --      Head Circumference --      Peak Flow --      Pain Score 09/20/22 0029 7     Pain Loc --      Pain Education --      Exclude from Growth Chart --     Most recent vital signs: Vitals:   09/20/22 0300 09/20/22 0330  BP: (!) 178/98 114/81  Pulse: 67 61  Resp: 20 20  Temp:    SpO2: 99% 95%    General: Awake, appears chronically ill but in no distress, alert and communicative.  Generally in good spirits. CV:  Good peripheral perfusion.  Regular rate and rhythm, normal heart sounds.  Right chest catheter. Resp:  Normal effort. Speaking easily and comfortably, no accessory muscle usage nor intercostal retractions.  Mild expiratory wheeze.  Patient endorses ongoing tobacco use. Abd:  No distention.  Soft, no tenderness to palpation of the abdomen.  No guarding or  rebound.   ED Results / Procedures / Treatments   Labs (all labs ordered are listed, but only abnormal results are displayed) Labs Reviewed  COMPREHENSIVE METABOLIC PANEL - Abnormal; Notable for the following components:      Result Value   Glucose, Bld 119 (*)    BUN 50 (*)    Creatinine, Ser 7.36 (*)    Calcium 8.1 (*)    AST 13 (*)    GFR, Estimated 8 (*)    All other components within normal limits  CBC - Abnormal; Notable for the following components:   WBC 11.0 (*)    RBC 3.68 (*)    Hemoglobin 10.3 (*)    HCT 33.7 (*)    RDW 15.9 (*)    All other components within normal limits  TROPONIN I (HIGH SENSITIVITY) - Abnormal; Notable for the following components:   Troponin I (High Sensitivity) 102 (*)    All other components within normal limits  TROPONIN I (HIGH SENSITIVITY) - Abnormal; Notable for the following components:   Troponin I (High Sensitivity) 107 (*)  All other components within normal limits  LIPASE, BLOOD     EKG  ED ECG REPORT I, Loleta Rose, the attending physician, personally viewed and interpreted this ECG.  Date: 09/20/2022 EKG Time: 00: 41 Rate: 76 Rhythm: normal sinus rhythm QRS Axis: Left axis deviation Intervals: normal ST/T Wave abnormalities: The patient has about 1 mm of ST segment elevation in leads V1 and V2 and V3.  Notched T waves in V4 and V5.  No significant reciprocal changes. Narrative Interpretation: Some concern over possible ischemic changes on EKG, but overall is similar and actually better in appearance particularly in the lateral leads than in prior EKGs.  Nondiagnostic for STEMI.    RADIOLOGY I viewed and interpreted the patient's chest x-ray.  There is no evidence of pneumonia or pulmonary vascular congestion/edema.  I also read the radiologist's report, which confirmed no acute findings.   PROCEDURES:  Critical Care performed: No  .1-3 Lead EKG Interpretation  Performed by: Loleta Rose, MD Authorized by:  Loleta Rose, MD     Interpretation: normal     ECG rate:  68   ECG rate assessment: normal     Rhythm: sinus rhythm     Ectopy: none     Conduction: normal       IMPRESSION / MDM / ASSESSMENT AND PLAN / ED COURSE  I reviewed the triage vital signs and the nursing notes.                              Differential diagnosis includes, but is not limited to, viral illness, pneumonia, volume overload, acute electrolyte or metabolic abnormality, PE, ACS.  Patient's presentation is most consistent with acute presentation with potential threat to life or bodily function.  Labs/studies ordered: EKG, two-view chest x-ray, CMP, lipase, CBC, high-sensitivity troponin x 2  Interventions/Medications given:  Medications - No data to display  (Note:  hospital course my include additional interventions and/or labs/studies not listed above.)   Initially I had the patient brought back immediately to room given concerns for EKG changes.  However the Confirmed he is not having any chest pain and overall the EKG is similar if not better, at least from the perspective the lateral leads, then on prior EKGs.  Although he is reporting some shortness of breath, he is in no distress and not using accessory muscles.  He has some mild wheezing but says he does not have a diagnosis of COPD and does not use breathing treatments.  EKG reassuring.  Labs are generally stable as well including his electrolytes and renal function for an end-stage renal patient.  Very mild leukocytosis of 11 is actually better than prior.  High-sensitivity troponin of 102 is also better than prior.  I read the patient's discharge summary from his recent hospitalization on 09/17/2022 (less than 3 full days ago).  The patient had malignant hypertension at the time as well as acute respiratory failure requiring urgent dialysis.  He is far better this time than he was in the past and his blood pressure is actually quite stable.  If his  second high-sensitivity troponin does not go up substantially indicating an acute issue, he should be appropriate for discharge and outpatient follow-up.  The patient is on the cardiac monitor to evaluate for evidence of arrhythmia and/or significant heart rate changes.   Clinical Course as of 09/20/22 0412  Mon Sep 20, 2022  0354 Troponin I (High Sensitivity)(!!): 107 Repeat  high-sensitivity troponin not substantially changed, particular for an end-stage renal patient.  Of note, patient is also hungry and is tolerating oral intake without difficulty.  I looked back through the record and he had a CT scan of the abdomen and pelvis just about a week ago.  No indication for further medical evaluation.  I considered hospitalization given his chronic medical conditions, but fortunately there is no evidence of an emergent condition at this time requiring hospitalization.  He can follow-up as an outpatient and continue with his regular dialysis schedule. [CF]    Clinical Course User Index [CF] Loleta Rose, MD     FINAL CLINICAL IMPRESSION(S) / ED DIAGNOSES   Final diagnoses:  Dyspnea, unspecified type  Generalized abdominal pain  Chronic kidney disease with end stage renal failure on dialysis [facility])     Rx / DC Orders   ED Discharge Orders     None        Note:  This document was prepared using Dragon voice recognition software and may include unintentional dictation errors.   Loleta Rose, MD 09/20/22 [phone]

## 2022-09-20 NOTE — ED Triage Notes (Signed)
Pt reports midline abdominal pain for about a week, seen recently for the same. "I thought the pain would go away, but it has not". Onset of SOB about 1 hour ago, denies cough. Last dialysis treatment Saturday.

## 2022-09-20 NOTE — Discharge Instructions (Signed)
Your workup in the Emergency Department today was reassuring.  We did not find any specific abnormalities.  We recommend you drink plenty of fluids, take your regular medications and/or any new ones prescribed today, and follow up with the doctor(s) listed in these documents as recommended.  Return to the Emergency Department if you develop new or worsening symptoms that concern you.  

## 2022-09-21 NOTE — Telephone Encounter (Signed)
Neurology advised as below.

## 2022-10-13 ENCOUNTER — Emergency Department (HOSPITAL_COMMUNITY): Payer: Medicare Other

## 2022-10-13 ENCOUNTER — Emergency Department (HOSPITAL_COMMUNITY)
Admission: EM | Admit: 2022-10-13 | Discharge: 2022-10-13 | Disposition: A | Discharge status: Home / Self Care | Payer: Medicare Other | Attending: Emergency Medicine | Admitting: Emergency Medicine

## 2022-10-13 DIAGNOSIS — Z7901 Long term (current) use of anticoagulants: Secondary | ICD-10-CM | POA: Insufficient documentation

## 2022-10-13 DIAGNOSIS — E875 Hyperkalemia: Secondary | ICD-10-CM | POA: Insufficient documentation

## 2022-10-13 DIAGNOSIS — R0602 Shortness of breath: Secondary | ICD-10-CM | POA: Diagnosis not present

## 2022-10-13 DIAGNOSIS — Z7982 Long term (current) use of aspirin: Secondary | ICD-10-CM | POA: Insufficient documentation

## 2022-10-13 DIAGNOSIS — Z992 Dependence on renal dialysis: Secondary | ICD-10-CM | POA: Insufficient documentation

## 2022-10-13 DIAGNOSIS — R1084 Generalized abdominal pain: Secondary | ICD-10-CM | POA: Diagnosis not present

## 2022-10-13 DIAGNOSIS — I509 Heart failure, unspecified: Secondary | ICD-10-CM | POA: Diagnosis not present

## 2022-10-13 DIAGNOSIS — N186 End stage renal disease: Secondary | ICD-10-CM | POA: Insufficient documentation

## 2022-10-13 DIAGNOSIS — R109 Unspecified abdominal pain: Secondary | ICD-10-CM | POA: Diagnosis present

## 2022-10-13 LAB — CBC WITH DIFFERENTIAL/PLATELET
Abs Immature Granulocytes: 0.02 10*3/uL (ref 0.00–0.07)
Basophils Absolute: 0 10*3/uL (ref 0.0–0.1)
Basophils Relative: 0 %
Eosinophils Absolute: 0.1 10*3/uL (ref 0.0–0.5)
Eosinophils Relative: 2 %
HCT: 40.6 % (ref 39.0–52.0)
Hemoglobin: 12.1 g/dL — ABNORMAL LOW (ref 13.0–17.0)
Immature Granulocytes: 0 %
Lymphocytes Relative: 14 %
Lymphs Abs: 1.1 10*3/uL (ref 0.7–4.0)
MCH: 27.6 pg (ref 26.0–34.0)
MCHC: 29.8 g/dL — ABNORMAL LOW (ref 30.0–36.0)
MCV: 92.5 fL (ref 80.0–100.0)
Monocytes Absolute: 0.7 10*3/uL (ref 0.1–1.0)
Monocytes Relative: 9 %
Neutro Abs: 5.7 10*3/uL (ref 1.7–7.7)
Neutrophils Relative %: 75 %
Platelets: 223 10*3/uL (ref 150–400)
RBC: 4.39 MIL/uL (ref 4.22–5.81)
RDW: 16.6 % — ABNORMAL HIGH (ref 11.5–15.5)
WBC: 7.6 10*3/uL (ref 4.0–10.5)
nRBC: 0 % (ref 0.0–0.2)

## 2022-10-13 LAB — I-STAT CHEM 8, ED
BUN: 51 mg/dL — ABNORMAL HIGH (ref 8–23)
Calcium, Ion: 0.82 mmol/L — CL (ref 1.15–1.40)
Chloride: 103 mmol/L (ref 98–111)
Creatinine, Ser: 9.3 mg/dL — ABNORMAL HIGH (ref 0.61–1.24)
Glucose, Bld: 115 mg/dL — ABNORMAL HIGH (ref 70–99)
HCT: 34 % — ABNORMAL LOW (ref 39.0–52.0)
Hemoglobin: 11.6 g/dL — ABNORMAL LOW (ref 13.0–17.0)
Potassium: 4.9 mmol/L (ref 3.5–5.1)
Sodium: 138 mmol/L (ref 135–145)
TCO2: 24 mmol/L (ref 22–32)

## 2022-10-13 LAB — COMPREHENSIVE METABOLIC PANEL
ALT: 26 U/L (ref 0–44)
AST: 20 U/L (ref 15–41)
Albumin: 3.5 g/dL (ref 3.5–5.0)
Alkaline Phosphatase: 60 U/L (ref 38–126)
Anion gap: 17 — ABNORMAL HIGH (ref 5–15)
BUN: 48 mg/dL — ABNORMAL HIGH (ref 8–23)
CO2: 23 mmol/L (ref 22–32)
Calcium: 7.3 mg/dL — ABNORMAL LOW (ref 8.9–10.3)
Chloride: 98 mmol/L (ref 98–111)
Creatinine, Ser: 8.63 mg/dL — ABNORMAL HIGH (ref 0.61–1.24)
GFR, Estimated: 6 mL/min — ABNORMAL LOW (ref >60)
Glucose, Bld: 115 mg/dL — ABNORMAL HIGH (ref 70–99)
Potassium: 6.1 mmol/L — ABNORMAL HIGH (ref 3.5–5.1)
Sodium: 138 mmol/L (ref 135–145)
Total Bilirubin: 0.5 mg/dL (ref 0.3–1.2)
Total Protein: 7.4 g/dL (ref 6.5–8.1)

## 2022-10-13 LAB — LIPASE, BLOOD: Lipase: 44 U/L (ref 11–51)

## 2022-10-13 LAB — MAGNESIUM: Magnesium: 2.3 mg/dL (ref 1.7–2.4)

## 2022-10-13 MED ORDER — DEXTROSE 50 % IV SOLN: 1.0000 | Freq: Once | INTRAVENOUS | Status: DC

## 2022-10-13 MED ORDER — CALCIUM GLUCONATE-NACL 1-0.675 GM/50ML-% IV SOLN: 1.0000 g | Freq: Once | INTRAVENOUS | Status: DC

## 2022-10-13 MED ORDER — ALBUTEROL SULFATE (2.5 MG/3ML) 0.083% IN NEBU: 10.0000 mg | INHALATION_SOLUTION | Freq: Once | RESPIRATORY_TRACT | Status: DC

## 2022-10-13 MED ORDER — SODIUM ZIRCONIUM CYCLOSILICATE 10 G PO PACK
10.0000 g | PACK | Freq: Once | ORAL | Status: AC
  Administered 2022-10-13: 10 g via ORAL
  Filled 2022-10-13: qty 1

## 2022-10-13 MED ORDER — INSULIN ASPART 100 UNIT/ML IV SOLN: 5.0000 [IU] | Freq: Once | INTRAVENOUS | Status: DC

## 2022-10-13 MED ORDER — ACETAMINOPHEN 325 MG PO TABS
650.0000 mg | ORAL_TABLET | Freq: Once | ORAL | Status: AC
  Administered 2022-10-13: 650 mg via ORAL
  Filled 2022-10-13: qty 2

## 2022-10-13 NOTE — Discharge Instructions (Addendum)
[facility] [place] Self-Service Account [employer]: [phone]  [facility] [place] is the [employer]'s shared-ride transportation service for eligible riders who have a disability that prevents them from riding the fixed route bus.  There are three types of certification granted to eligible Access clients: full, conditional, and temporary certification. Full certification is granted to those who are unable to use the fixed route bus system. Conditional certification is granted to those who are able to use the fixed route bus system, but their disability prohibits 100 percent travel on the bus due to possible barriers for safe travel to or from the bus stop. Temporary certification is granted when a person has a disability that prevents the use of the fixed route bus during the length of their short-term disability.  A client may be eligible for door-to-door or curb-to-curb service once the application and site assessment review are completed.  Door-to-Door Determination With door-to-door service, the driver comes to the door to inform the client that he or she is there to pick the client up. The driver can only wait five minutes for the client to come to the door. If the client does not come to the door, the driver is required to leave and the client is considered a "no-show." The driver will leave a door hanger notice to mark the visit.  Curb-to-Curb Determination With curb-to-curb services, the Zenaida Niece comes to the curb of the pick-up location. The driver can only wait five minutes for the client to come out. If the client does not come out to board the Zenaida Niece, the driver is required to leave and the client would be considered a "no-show." The driver will leave a door hanger notice to mark the visit.   [facility] [place] Application  For your convenience, you can submit an application for [facility] [place] services by visiting both of the links below. PART A and PART B must be completed for  consideration.  [facility] [place] Application Part A [facility] [place] Application Part B  To complete a physical application for the Access service, please click below to download pdfs or call [phone] for one to be mailed to you. PART A and PART B must be received for consideration.   Access Application Cover Letter Access Application Part A  Access Application Part B If I am approved, what will I receive? Each certified client will receive the following information:  Eligibility determination letter (Curb-to-Curb or Door-to-Door) with certification expiration date [facility] [place] Photo ID Card (Conditional Certification only, by appointment only) No-Show Policy Cancellation Policy Current Fare Structure Guidelines Access Facts (information regarding the service policies and procedures) Appeals Process information Other Important Information About the [facility] [place] Program: Service Area and Hours of Operation The service area for Access is the city limits of [place]. The hours of operation are: Monday-Friday, 5:00 am to 11:30 pm Saturday and Sunday, 6 am to 10 pm  *Access does not operate on Thanksgiving, Christmas Day or New Year's Day. Service times on other holidays subject to change.  Reservations Process Reservations must be made by 5 pm one day in advance or a client may schedule a trip from one to seven days prior to the desired travel date. For a reservation, call [phone].  In addition, reservations can be made 24/7 using our [facility] [place] Self-Service Account. For instructions on how to register and use the portal, click here.  App Screen  New Access App [employer] is proud to announce the availability of the A-Ride app. Available for Apple  and Android devices, A-Ride is the easiest way yet to schedule trips. Simply download the free app from your device store and contact Lorretta Harp for account activation at [phone].    Pick-Up Time Since [facility] [place] is a "shared-ride" service,  you will be given a ready time of 30 minutes within your requested pick-up time. During this 30-minute pick-up window, the operator may be picking up or dropping off other passengers along the way. If you have an appointment or a specific time to reach or leave your destination, please let the reservationist know this when you make your reservation so the Zenaida Niece can get you there on time. Also, when the Zenaida Niece arrives to pick you up during the 30-minute window, please be ready. The operator will wait up to five minutes before leaving.  Service Animals Service animals are welcome on all [facility] [place] and [employer] transit services.  Cancelation Policy You may cancel a trip using [facility] [place] Self-[facility] Account online or by calling [phone] between 8 am and 5 pm, at least one hour in advance. After 5 pm, you may leave a message on the voice mail system to cancel a trip for the next day.  Late Cancelation / No-Show Policy If the Zenaida Niece arrives within your pickup window and you do not make your scheduled trip, or you cancel a trip late (less than one hour before your scheduled pick up time), you will be considered a "no-show." If, however, circumstances are beyond your control, such as you are sick and unable to travel, or you experience an unreasonable delay on the phone trying to cancel your trip, you will not be assessed a "no-show."  Clients who show a pattern of repeated missed trips will be issued a warning letter in 30-day increments. After which time, the rider may ultimately risk being removed from standing order status. For a complete copy of the Late Cancelation / No-Show policy, please call [phone] or refer to the [facility] [place] Ride Guide.   Customer Service Access customer service representative is available Monday through Friday from 8 am to 5 pm to address concerns regarding the [facility] [place] service. You may call [phone].   [facility] [place] Fares Cash Fare ~ $2.50  10 Ride Punch Card ~ $18 (Individual  Purchase 1 Card)  40 Ride Punch Card ~ $56 (Bulk Purchase 4 Cards)   For more information about the [place] [employer], visit this webpage.  *All fares for [facility] [place] must be paid in the form of cash, check or UMO card. Credit and debit cards are not accepted.  Please follow-up with your primary care provider regarding recent symptoms and ER visit.  Today potassium was high however was brought back down with medication.  You will need to follow-up with your nephrologist for dialysis tomorrow.  I have also attached a general surgeon to help with your abdominal pain as it is most likely from your gallbladder.  If symptoms change or worsen please return to ER.

## 2022-10-13 NOTE — TOC Initial Note (Signed)
Transition of Care [facility]) - Initial/Assessment Note    Patient Details  Name: Anthony Hess MRN: [id number] Date of Birth: 11/28/1955  Transition of Care [facility]) CM/SW Contact:    Carmina Miller, LCSWA Phone Number: 10/13/2022, 7:41 PM  Clinical Narrative:                  CSW received call from PA requesting transportation resources for pt to get to HD, CSW provided information for [facility] on AVS, pt also requested contact information for [facility], address placed on AVS.         Patient Goals and CMS Choice            Expected Discharge Plan and Services                                              Prior Living Arrangements/Services                       Activities of Daily Living      Permission Sought/Granted                  Emotional Assessment              Admission diagnosis:  SOB Patient Active Problem List   Diagnosis Date Noted   ESRD on hemodialysis (HCC) 09/17/2022   Acute on chronic diastolic CHF (congestive heart failure) (HCC) 09/17/2022   History of pulmonary embolism 09/17/2022   Coronary artery disease 09/17/2022   Essential hypertension 09/17/2022   Acute respiratory failure with hypoxia (HCC) 09/06/2022   Fluid overload 09/06/2022   Hypertensive emergency 09/06/2022   HLD (hyperlipidemia) 09/06/2022   CAD (coronary artery disease) 09/06/2022   Pulmonary embolism (HCC) 09/06/2022   NSTEMI (non-ST elevated myocardial infarction) (HCC) 08/17/2022   (HFpEF) heart failure with preserved ejection fraction (HCC) 08/17/2022   Seasonal allergic rhinitis due to pollen 06/25/2022   Nausea 06/25/2022   Hoarseness of voice 06/25/2022   S/P exploratory laparotomy for perforated duodenum with admission 04/26/22 05/04/2022   Postprocedural intraabdominal abscess 05/04/2022   Acute pulmonary embolism (HCC) 04/19/2022   Dyslipidemia 04/19/2022   End-stage renal disease on hemodialysis (HCC) 04/19/2022   Tobacco dependence  04/19/2022   Chronic systolic heart failure (HCC) 04/05/2022   Hypertensive urgency 11/16/2021   Hyperkalemia 11/16/2021   Chronic back pain 11/16/2021   Hypertension secondary to other renal disorders 10/15/2021   Vitamin B12 deficiency anemia due to intrinsic factor deficiency 06/23/2021   Abdominal pain    SBP (spontaneous bacterial peritonitis) (HCC) 04/08/2021   COVID-19 virus infection 04/08/2021   AAA (abdominal aortic aneurysm) (HCC) 03/18/2021   Hypocalcemia    Leukocytosis 03/05/2021   Spontaneous bacterial peritonitis (HCC) 03/04/2021   Incisional hernia, without obstruction or gangrene    Generalized (acute) peritonitis (HCC) 12/10/2020   Iron deficiency anemia, unspecified 05/06/2020   Moderate protein-calorie malnutrition (HCC) 05/06/2020   Other long term (current) drug therapy 05/06/2020   Unspecified jaundice 05/06/2020   PD catheter dysfunction (HCC) 04/13/2020   Bilateral carotid artery stenosis 02/29/2020   Coronary artery disease involving coronary bypass graft of native heart 02/29/2020   Secondary hyperparathyroidism of renal origin (HCC) 01/30/2020   Acute peritonitis (HCC) 01/08/2020   Hyperlipidemia, mixed 01/04/2020   Mass of left side of neck 01/04/2020   Senile purpura (HCC) 01/04/2020  Hydroureteronephrosis    Atrial fibrillation, chronic (HCC) 01/05/2019   End stage renal disease (HCC) 01/05/2019   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 [place]) [place] [address] Phone: [phone] Fax: [phone]     Social Determinants of Health (SDOH) Social  History: SDOH Screenings   Food Insecurity: No Food Insecurity (09/15/2022)  Housing: Low Risk  (09/15/2022)  Transportation Needs: No Transportation Needs (09/15/2022)  Utilities: Not At Risk (09/15/2022)  Depression (PHQ2-9): Low Risk  (08/23/2022)  Financial Resource Strain: Low Risk  (08/23/2022)  Physical Activity: Inactive (08/23/2022)  Social Connections: Moderately Integrated (08/23/2022)  Stress: No Stress Concern Present (08/23/2022)  Tobacco Use: High Risk (09/17/2022)   SDOH Interventions:     Readmission Risk Interventions    09/17/2022   10:26 AM 09/15/2022    9:08 AM 04/28/2022    2:26 PM  Readmission Risk Prevention Plan  Transportation Screening Complete Complete Complete  Medication Review Oceanographer) Complete Complete Complete  PCP or Specialist appointment within 3-5 days of discharge Complete Complete Complete  HRI or Home Care Consult Complete Complete Complete  SW Recovery Care/Counseling Consult Complete Complete Complete  Palliative Care Screening Not Applicable Not Applicable Not Applicable  Skilled Nursing Facility Complete Not Applicable Not Applicable

## 2022-10-13 NOTE — ED Triage Notes (Addendum)
Patient BIB [facility] from [facility] for shob starting today after missing dialysis yesterday. No other complaints with EMS, O2 99% on RA, RR 16. NAD, Patient now reports sharp abd pain and numbness in his fingers.

## 2022-10-13 NOTE — ED Provider Notes (Signed)
I was handed patient's EKG in triage. Patient with ST depressions/notched T waves in inferolateral leads with deepening T waves and ST depressions when compared to prior EKG in July. He has ST elevations in anterior leads >1 mm. This is not dissimilar to prior EKG but appears worse.  Paged cardiology, Dr. Lynnette Caffey returned my call. We discussed EKG, states it is likely worsening d/t high LVEP in s/o missing dialysis. Would not cath at this time. Will continue medical workup.    Loetta Rough, MD 10/13/22 [phone]

## 2022-10-13 NOTE — ED Provider Notes (Addendum)
[facility] DEPARTMENT AT [facility] Provider Note   CSN: [id number] Arrival date & time: 10/13/22  1342     History  Chief Complaint  Patient presents with   Shortness of Breath    Anthony Hess is a 67 y.o. male history of ESRD on dialysis, DCM, heart failure, infrarenal AAA, myocardial infarction, perforated bowel presenting with 1 week of abdominal pain, dizziness, shortness of breath.  Patient is unsure what causes symptoms or what alleviates them.  Patient gets dialysis Tuesday Thursday Saturday and did not get his dialysis yesterday but states his symptoms have going on for longer.  Patient denies any fevers but states he has been nauseous with nonbloody emesis.  Patient states that when he gets dizzy he feels unstable but is unable to further explain when this occurs it would make this better.Patient does have history of gallstones and states this feels very similar.  Patient denied chest pain, hemoptysis, change in sensation ecchymosis, new onset weakness, vision changes, neck pain, headache  Home Medications Prior to Admission medications   Medication Sig Start Date End Date Taking? Authorizing Provider  amLODipine (NORVASC) 2.5 MG tablet Take 1 tablet (2.5 mg total) by mouth daily. 05/07/22   Alford Highland, MD  apixaban (ELIQUIS) 5 MG TABS tablet Take 1 tablet (5 mg total) by mouth 2 (two) times daily. 05/01/22   Baron Hamper, MD  aspirin EC 81 MG tablet Take 81 mg by mouth daily. 05/06/20   [provider]  calcitRIOL (ROCALTROL) 0.5 MCG capsule Take 1 capsule (0.5 mcg total) by mouth daily. 11/18/21   Sunnie Nielsen, DO  calcium acetate (PHOSLO) 667 MG capsule Take 1 capsule (667 mg total) by mouth 3 (three) times daily with meals. 11/18/21   Sunnie Nielsen, DO  isosorbide dinitrate (ISORDIL) 10 MG tablet Take 1 tablet (10 mg total) by mouth 3 (three) times daily. 04/21/22   Lurene Shadow, MD  metoprolol succinate (TOPROL-XL) 25 MG 24 hr  tablet Take 25 mg by mouth daily. 03/12/22   [provider]  ondansetron (ZOFRAN-ODT) 4 MG disintegrating tablet Take 1 tablet (4 mg total) by mouth every 8 (eight) hours as needed for nausea or vomiting. 06/25/22   Simmons-Robinson, Makiera, MD  pantoprazole (PROTONIX) 40 MG tablet Take 1 tablet (40 mg total) by mouth daily. 05/07/22 06/06/22  Alford Highland, MD      Allergies    Patient has no known allergies.    Review of Systems   Review of Systems  Respiratory:  Positive for shortness of breath.     Physical Exam Updated Vital Signs BP (!) 131/97 (BP Location: Right Arm)   Pulse 66   Temp 98.5 F (36.9 C) (Oral)   Resp 18   SpO2 100%  Physical Exam Vitals reviewed.  Constitutional:      General: He is not in acute distress. HENT:     Head: Normocephalic and atraumatic.  Eyes:     Extraocular Movements: Extraocular movements intact.     Conjunctiva/sclera: Conjunctivae normal.     Pupils: Pupils are equal, round, and reactive to light.  Cardiovascular:     Rate and Rhythm: Normal rate and regular rhythm.     Pulses: Normal pulses.     Heart sounds: Normal heart sounds.     Comments: 2+ bilateral radial/dorsalis pedis pulses with regular rate Pulmonary:     Effort: Pulmonary effort is normal. No respiratory distress.     Breath sounds: Normal breath sounds.  Abdominal:  Palpations: Abdomen is soft.     Tenderness: There is abdominal tenderness (Generalized). There is no guarding or rebound.  Musculoskeletal:        General: Normal range of motion.     Cervical back: Normal range of motion and neck supple.     Right lower leg: No edema.     Left lower leg: No edema.     Comments: 5 out of 5 bilateral grip/leg extension strength  Skin:    General: Skin is warm and dry.     Capillary Refill: Capillary refill takes less than 2 seconds.  Neurological:     General: No focal deficit present.     Mental Status: He is alert and oriented to person, place, and  time.     Comments: Sensation intact in all 4 limbs  Psychiatric:        Mood and Affect: Mood normal.     ED Results / Procedures / Treatments   Labs (all labs ordered are listed, but only abnormal results are displayed) Labs Reviewed  CBC WITH DIFFERENTIAL/PLATELET - Abnormal; Notable for the following components:      Result Value   Hemoglobin 12.1 (*)    MCHC 29.8 (*)    RDW 16.6 (*)    All other components within normal limits  COMPREHENSIVE METABOLIC PANEL - Abnormal; Notable for the following components:   Potassium 6.1 (*)    Glucose, Bld 115 (*)    BUN 48 (*)    Creatinine, Ser 8.63 (*)    Calcium 7.3 (*)    GFR, Estimated 6 (*)    Anion gap 17 (*)    All other components within normal limits  I-STAT CHEM 8, ED - Abnormal; Notable for the following components:   BUN 51 (*)    Creatinine, Ser 9.30 (*)    Glucose, Bld 115 (*)    Calcium, Ion 0.82 (*)    Hemoglobin 11.6 (*)    HCT 34.0 (*)    All other components within normal limits  LIPASE, BLOOD  MAGNESIUM    EKG EKG Interpretation Date/Time:  Wednesday October 13 2022 14:08:13 EDT Ventricular Rate:  66 PR Interval:  154 QRS Duration:  100 QT Interval:  474 QTC Calculation: 496 R Axis:   -59  Text Interpretation: Normal sinus rhythm Biatrial enlargement Incomplete right bundle branch block Left anterior fascicular block No significant change since last tracing Confirmed by Melene Plan [phone]) on 10/13/2022 3:23:00 PM  Radiology DG Chest 2 View  Result Date: 10/13/2022 CLINICAL DATA:  Shortness of breath. EXAM: CHEST - 2 VIEW COMPARISON:  September 20, 2022. FINDINGS: The heart size and mediastinal contours are within normal limits. Both lungs are clear. Stable right-sided dialysis catheter. Status post coronary artery bypass graft. Moderate dextroscoliosis of thoracic spine. IMPRESSION: No active cardiopulmonary disease. Electronically Signed   By: Lupita Raider M.D.   On: 10/13/2022 16:14     Procedures .Critical Care  Performed by: Netta Corrigan, PA-C Authorized by: Netta Corrigan, PA-C   Critical care provider statement:    Critical care time (minutes):  30   Critical care time was exclusive of:  Separately billable procedures and treating other patients   Critical care was necessary to treat or prevent imminent or life-threatening deterioration of the following conditions: Hyperkalemia.   Critical care was time spent personally by me on the following activities:  Blood draw for specimens, development of treatment plan with patient or surrogate, discussions with consultants,  evaluation of patient's response to treatment, examination of patient, obtaining history from patient or surrogate, review of old charts, re-evaluation of patient's condition, pulse oximetry, ordering and review of radiographic studies, ordering and review of laboratory studies and ordering and performing treatments and interventions   I assumed direction of critical care for this patient from another provider in my specialty: no     Care discussed with comment:  Nephrologist     Medications Ordered in ED Medications  sodium zirconium cyclosilicate (LOKELMA) packet 10 g (10 g Oral Given 10/13/22 1743)  acetaminophen (TYLENOL) tablet 650 mg (650 mg Oral Given 10/13/22 1743)    ED Course/ Medical Decision Making/ A&P                                 Medical Decision Making Amount and/or Complexity of Data Reviewed Labs: ordered. Radiology: ordered.  Risk OTC drugs. Prescription drug management.   Mati Connaughton 67 y.o. presented today for multiple complaints. Working DDx that I considered at this time includes, but not limited to, gastroenteritis, colitis, small bowel obstruction, appendicitis, cholecystitis, pancreatitis, nephrolithiasis, AAA, UTI, pyelonephritis, ruptured ectopic pregnancy, PID, testicular torsion, electrolyte abnormalities.  R/o DDx: gastroenteritis, colitis, small bowel  obstruction, appendicitis, cholecystitis, pancreatitis, nephrolithiasis, AAA, UTI, pyelonephritis, ruptured ectopic pregnancy, PID, testicular torsion: These are considered less likely due to history of present illness and physical exam findings.  Review of prior external notes: 09/17/2022 discharge summary  Unique Tests and My Interpretation:  CBC with differential: Unremarkable CMP: Hypokalemia 6.1, BUN/creatinine at baseline Lipase: Negative CXR: No acute changes EKG: Rate, rhythm, axis, intervals all examined and without medically relevant abnormality.  T waves are not peaked  Discussion with Independent Historian: None  Discussion of Management of Tests:  Valentino Nose, MD Nephrologist; [place] Social Worker  Risk: Medium: prescription drug management  Risk Stratification Score: none  Staffed with Adela Lank, DO   Plan: On exam patient was in no acute distress with stable vitals.  Patient's physical exam was remarkable for discomfort throughout his abdomen without peritoneal signs.  Patient does have history of gallstones and states this feels very similar.  Patient denies any bowel changes, leg swelling, pain rating to his back.  Labs are ordered to further evaluate along with patient's electrolytes as he did miss dialysis yesterday.  Patient stable at this time.  Patient's potassium came back elevated 6.1.  I spoke to the nephrologist in regards to patient's hyperkalemia of 6.1.  Patient not have any EKG changes and so the nephrologist recommended doing 10 of Lokelma and rechecking his potassium in 1 hour.  If the potassium is gone down patient can be discharged however if it remains elevated patient may be admitted.  Patient's repeat potassium is 4.9.  I went spoke to the patient and patient states he is okay with being discharged however want to speak to a social worker due to transportation issues and having a place to stay.  I spoke to the social worker on-call and she stated she would  put in resources in the AVS for the patient.  Patient stated that he was okay with being discharged and following up with a general surgeon for his gallstones as he states the pain he is experiencing is similar to when he had gallstones previously.  At this time a very low suspicion of any life-threatening diagnoses given patient's labs and reassuring repeat potassium.  Patient's physical exam was also reassuring  outside of the generalized discomfort he had on exam and during patient's ED stay patient is appeared comfortable throughout entire stay.  At this time patient does not need imaging as he has had multiple imaging in the past for similar concern.  Patient states he is okay to being discharged without having his magnesium lab back.  Will discharge with resources and general surgery referral.  Patient was given return precautions. Patient stable for discharge at this time.  Patient verbalized understanding of plan.         Final Clinical Impression(s) / ED Diagnoses Final diagnoses:  Generalized abdominal pain  Hyperkalemia  Hypocalcemia    Rx / DC Orders ED Discharge Orders     None         Shyne, [place] 10/13/22 1950    Netta Corrigan, PA-C 10/13/22 1959    Melene Plan, DO 10/13/22 2121

## 2022-10-14 ENCOUNTER — Emergency Department: Payer: Medicare Other

## 2022-10-14 ENCOUNTER — Observation Stay
Admission: EM | Admit: 2022-10-14 | Discharge: 2022-10-17 | Disposition: A | Discharge status: Home / Self Care | Payer: Medicare Other | Attending: Internal Medicine | Admitting: Internal Medicine

## 2022-10-14 DIAGNOSIS — I2699 Other pulmonary embolism without acute cor pulmonale: Secondary | ICD-10-CM | POA: Diagnosis present

## 2022-10-14 DIAGNOSIS — I2510 Atherosclerotic heart disease of native coronary artery without angina pectoris: Secondary | ICD-10-CM | POA: Diagnosis not present

## 2022-10-14 DIAGNOSIS — Z8551 Personal history of malignant neoplasm of bladder: Secondary | ICD-10-CM | POA: Insufficient documentation

## 2022-10-14 DIAGNOSIS — Z992 Dependence on renal dialysis: Secondary | ICD-10-CM | POA: Insufficient documentation

## 2022-10-14 DIAGNOSIS — I160 Hypertensive urgency: Secondary | ICD-10-CM | POA: Diagnosis present

## 2022-10-14 DIAGNOSIS — Z7901 Long term (current) use of anticoagulants: Secondary | ICD-10-CM | POA: Insufficient documentation

## 2022-10-14 DIAGNOSIS — Z1152 Encounter for screening for COVID-19: Secondary | ICD-10-CM | POA: Insufficient documentation

## 2022-10-14 DIAGNOSIS — I5020 Unspecified systolic (congestive) heart failure: Secondary | ICD-10-CM | POA: Insufficient documentation

## 2022-10-14 DIAGNOSIS — N186 End stage renal disease: Secondary | ICD-10-CM | POA: Diagnosis not present

## 2022-10-14 DIAGNOSIS — Z951 Presence of aortocoronary bypass graft: Secondary | ICD-10-CM | POA: Diagnosis not present

## 2022-10-14 DIAGNOSIS — R0600 Dyspnea, unspecified: Principal | ICD-10-CM | POA: Diagnosis present

## 2022-10-14 DIAGNOSIS — Z79899 Other long term (current) drug therapy: Secondary | ICD-10-CM | POA: Insufficient documentation

## 2022-10-14 DIAGNOSIS — I132 Hypertensive heart and chronic kidney disease with heart failure and with stage 5 chronic kidney disease, or end stage renal disease: Secondary | ICD-10-CM | POA: Insufficient documentation

## 2022-10-14 DIAGNOSIS — K219 Gastro-esophageal reflux disease without esophagitis: Secondary | ICD-10-CM

## 2022-10-14 DIAGNOSIS — Z7982 Long term (current) use of aspirin: Secondary | ICD-10-CM | POA: Diagnosis not present

## 2022-10-14 DIAGNOSIS — F17210 Nicotine dependence, cigarettes, uncomplicated: Secondary | ICD-10-CM | POA: Diagnosis not present

## 2022-10-14 DIAGNOSIS — U071 COVID-19: Secondary | ICD-10-CM | POA: Diagnosis present

## 2022-10-14 DIAGNOSIS — R0602 Shortness of breath: Principal | ICD-10-CM

## 2022-10-14 LAB — CBC WITH DIFFERENTIAL/PLATELET
Abs Immature Granulocytes: 0.04 10*3/uL (ref 0.00–0.07)
Basophils Absolute: 0 10*3/uL (ref 0.0–0.1)
Basophils Relative: 0 %
Eosinophils Absolute: 0.3 10*3/uL (ref 0.0–0.5)
Eosinophils Relative: 3 %
HCT: 35.8 % — ABNORMAL LOW (ref 39.0–52.0)
Hemoglobin: 10.7 g/dL — ABNORMAL LOW (ref 13.0–17.0)
Immature Granulocytes: 0 %
Lymphocytes Relative: 14 %
Lymphs Abs: 1.2 10*3/uL (ref 0.7–4.0)
MCH: 27.8 pg (ref 26.0–34.0)
MCHC: 29.9 g/dL — ABNORMAL LOW (ref 30.0–36.0)
MCV: 93 fL (ref 80.0–100.0)
Monocytes Absolute: 0.9 10*3/uL (ref 0.1–1.0)
Monocytes Relative: 10 %
Neutro Abs: 6.4 10*3/uL (ref 1.7–7.7)
Neutrophils Relative %: 73 %
Platelets: 202 10*3/uL (ref 150–400)
RBC: 3.85 MIL/uL — ABNORMAL LOW (ref 4.22–5.81)
RDW: 16.6 % — ABNORMAL HIGH (ref 11.5–15.5)
WBC: 8.9 10*3/uL (ref 4.0–10.5)
nRBC: 0 % (ref 0.0–0.2)

## 2022-10-14 LAB — BASIC METABOLIC PANEL
Anion gap: 23 — ABNORMAL HIGH (ref 5–15)
BUN: 67 mg/dL — ABNORMAL HIGH (ref 8–23)
CO2: 17 mmol/L — ABNORMAL LOW (ref 22–32)
Calcium: 6 mg/dL — CL (ref 8.9–10.3)
Chloride: 98 mmol/L (ref 98–111)
Creatinine, Ser: 9.77 mg/dL — ABNORMAL HIGH (ref 0.61–1.24)
GFR, Estimated: 5 mL/min — ABNORMAL LOW (ref >60)
Glucose, Bld: 72 mg/dL (ref 70–99)
Potassium: 5.2 mmol/L — ABNORMAL HIGH (ref 3.5–5.1)
Sodium: 138 mmol/L (ref 135–145)

## 2022-10-14 LAB — SARS CORONAVIRUS 2 BY RT PCR: SARS Coronavirus 2 by RT PCR: POSITIVE — AB

## 2022-10-14 LAB — TROPONIN I (HIGH SENSITIVITY): Troponin I (High Sensitivity): 118 ng/L (ref <18)

## 2022-10-14 MED ORDER — CALCIUM GLUCONATE-NACL 1-0.675 GM/50ML-% IV SOLN
1.0000 g | Freq: Once | INTRAVENOUS | Status: AC
  Administered 2022-10-15: 1000 mg via INTRAVENOUS
  Filled 2022-10-14: qty 50

## 2022-10-14 MED ORDER — IOHEXOL 350 MG/ML SOLN
75.0000 mL | Freq: Once | INTRAVENOUS | Status: AC | PRN
  Administered 2022-10-14: 75 mL via INTRAVENOUS

## 2022-10-14 NOTE — Progress Notes (Signed)
ANTICOAGULATION CONSULT NOTE  Pharmacy Consult for heparin infusion Indication: pulmonary embolus  No Known Allergies  Patient Measurements: Height: 5\' 3"  (160 cm) Weight: 62.1 kg (137 lb) IBW/kg (Calculated) : 56.9 Heparin Dosing Weight: 62.1 kg  Vital Signs: Temp: 97.8 F (36.6 C) (08/22 2223) Temp Source: Oral (08/22 2223) BP: 167/122 (08/22 2223) Pulse Rate: 74 (08/22 2223)  Labs: Recent Labs    10/13/22 1539 10/13/22 1815 10/14/22 2229  HGB 12.1* 11.6* 10.7*  HCT 40.6 34.0* 35.8*  PLT 223  --  202  CREATININE 8.63* 9.30* 9.77*  TROPONINIHS  --   --  118*    Estimated Creatinine Clearance: 6.1 mL/min (A) (by C-G formula based on SCr of 9.77 mg/dL (H)).   Medical History: Past Medical History:  Diagnosis Date   Aortic atherosclerosis (HCC)    Bilateral carotid artery disease (HCC)    Bladder cancer (HCC)    Coronary artery disease 12/20/2018   a.) LHC 12/20/2018: 50% OM1, 40% OM2, 95% o-pLAD, 75% o=pLCx, 40% mLM, 70% D1, 60% mRCA-1, 50% mRCA-2; refer to CVTS. b.) 4v CABG [facility] on 12/27/2018: LIMA-LAD, RIMA-PDA, seg LRA-OM1-D1   DCM (dilated cardiomyopathy) (HCC) 12/05/2018   a.) TTE 12/05/2018: EF 40-45%. b.) TTE 12/28/2019: EF 20-25%.   ESRD (end stage renal disease) (HCC)    a.) T-Th-Sat   HFrEF (heart failure with reduced ejection fraction) (HCC) 12/05/2018   a.) TTE 12/05/2018: EF 40-45%; mild LVH; ant/apical/sep HK; mild TR . b.) TTE 12/28/2019: EF 20-25%; mod LVH; mod MR/AR; G1DD.   History of 2019 novel coronavirus disease (COVID-19) 04/08/2021   History of kidney stones    HLD (hyperlipidemia)    Hx of CABG 12/27/2018   Hypertension    Infrarenal abdominal aortic aneurysm (AAA) without rupture (HCC) 03/05/2021   a.) CT abd/pelvis; measured 3.2 cm.   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   a.) LIMA-LAD, RIMA-PDA, sequential LEFT radial artery to OM1 and D1   Sepsis (HCC) 03/14/2021   Wears glasses     Medications:  PTA Meds: Apixaban 5 mg BID  Assessment: Pt is a 67 yo male w/ ERSD on HD presenting to ED c/o SOB, found with "tiny nonocclusive subsegmental pulmonary embolism in a left lower lobe posterior artery."  Goal of Therapy:  Heparin level 0.3-0.7 units/ml aPTT 66-102 seconds Monitor platelets by anticoagulation protocol: Yes   Plan:  D/T unknown last dose of Apixaban, will bolus 1900 units x 1 Start heparin infusion at 1000 units/hr Will follow aPTT until correlation w/ HL confirmed Will check aPTT in 8 hr after start of infusion HL & CBC daily while on heparin  Otelia Sergeant, PharmD, [facility] 10/14/2022 11:57 PM

## 2022-10-14 NOTE — ED Provider Notes (Signed)
[facility] Provider Note    Event Date/Time   First MD Initiated Contact with Patient 10/14/22 2219     (approximate)   History   Shortness of breath   HPI  Anthony Hess is a 67 y.o. male who presents to the emergency department today because of concerns for shortness of breath.  Patient states that shortness of breath started today.  Patient has history of dialysis and has missed 2 previous sessions.  He says that this is due to transportation issue.  He denies any significant chest pain with the shortness of breath.  The patient denies any recent fevers.  He does not produce any urine.  He has had some right leg pain.     Physical Exam   Triage Vital Signs: ED Triage Vitals  Encounter Vitals Group     BP 10/14/22 2223 (!) 167/122     Systolic BP Percentile --      Diastolic BP Percentile --      Pulse Rate 10/14/22 2223 74     Resp 10/14/22 2223 20     Temp 10/14/22 2223 97.8 F (36.6 C)     Temp Source 10/14/22 2223 Oral     SpO2 10/14/22 2222 97 %     Weight 10/14/22 2226 137 lb (62.1 kg)     Height 10/14/22 2226 5\' 3"  (1.6 m)     Head Circumference --      Peak Flow --      Pain Score 10/14/22 2225 0     Pain Loc --      Pain Education --      Exclude from Growth Chart --     Most recent vital signs: Vitals:   10/14/22 2222 10/14/22 2223  BP:  (!) 167/122  Pulse:  74  Resp:  20  Temp:  97.8 F (36.6 C)  SpO2: 97% 98%   General: Awake, alert, oriented. CV:  Good peripheral perfusion. Regular rate and rhythm. Resp:  Normal effort. Lungs clear. Abd:  No distention.  Ext:  No lower extremity edema.    ED Results / Procedures / Treatments   Labs (all labs ordered are listed, but only abnormal results are displayed) Labs Reviewed  CBC WITH DIFFERENTIAL/PLATELET - Abnormal; Notable for the following components:      Result Value   RBC 3.85 (*)    Hemoglobin 10.7 (*)    HCT 35.8 (*)    MCHC 29.9 (*)    RDW 16.6 (*)    All  other components within normal limits  BASIC METABOLIC PANEL - Abnormal; Notable for the following components:   Potassium 5.2 (*)    CO2 17 (*)    BUN 67 (*)    Creatinine, Ser 9.77 (*)    Calcium 6.0 (*)    GFR, Estimated 5 (*)    Anion gap 23 (*)    All other components within normal limits  TROPONIN I (HIGH SENSITIVITY) - Abnormal; Notable for the following components:   Troponin I (High Sensitivity) 118 (*)    All other components within normal limits  SARS CORONAVIRUS 2 BY RT PCR     EKG  I, Phineas Semen, attending physician, personally viewed and interpreted this EKG  EKG Time: 2225 Rate: 77 Rhythm: ectopic atrial rhythm Axis: left axis deviation Intervals: qtc 518 QRS: narrow ST changes: no st elevation Impression: abnormal ekg   RADIOLOGY I independently interpreted and visualized the CXR. My interpretation: No pneumonia Radiology  interpretation:  IMPRESSION:  No active cardiopulmonary disease.       I independently interpreted and visualized the CT angio PE. My interpretation: Left sided PE Radiology interpretation:  IMPRESSION:  1. Tiny nonocclusive subsegmental pulmonary embolism in a left lower  lobe posterior artery. This is similar to CT abdomen and pelvis  08/10/2022. No right heart strain.  2. Advanced emphysema.    PROCEDURES:  Critical Care performed: No    MEDICATIONS ORDERED IN ED: Medications  iohexol (OMNIPAQUE) 350 MG/ML injection 75 mL (75 mLs Intravenous Contrast Given 10/14/22 2330)     IMPRESSION / MDM / ASSESSMENT AND PLAN / ED COURSE  I reviewed the triage vital signs and the nursing notes.                              Differential diagnosis includes, but is not limited to, pneumonia, fluid overload, PE  Patient's presentation is most consistent with acute presentation with potential threat to life or bodily function.   The patient is on the cardiac monitor to evaluate for evidence of arrhythmia and/or significant  heart rate changes.  Patient presents to the emergency department today because of concerns for shortness of breath.  Patient has missed his previous 2 dialysis sessions.  On exam lungs are clear.  No significant edema.  Blood work is noticeable for significant hypocalcemia.  The patient will be given calcium here in the emergency department.  Potassium is normal.  CT scan was obtained is concerning for left-sided pulmonary embolism.  Will plan on admission.      FINAL CLINICAL IMPRESSION(S) / ED DIAGNOSES   Final diagnoses:  Shortness of breath  Pulmonary embolism, other, unspecified chronicity, unspecified whether acute cor pulmonale present [facility])    Note:  This document was prepared using Dragon voice recognition software and may include unintentional dictation errors.    Phineas Semen, MD 10/14/22 [phone]

## 2022-10-15 ENCOUNTER — Encounter: Payer: Self-pay | Admitting: Family Medicine

## 2022-10-15 ENCOUNTER — Inpatient Hospital Stay: Payer: Medicare Other

## 2022-10-15 ENCOUNTER — Other Ambulatory Visit: Payer: Self-pay

## 2022-10-15 DIAGNOSIS — U071 COVID-19: Secondary | ICD-10-CM

## 2022-10-15 DIAGNOSIS — Z992 Dependence on renal dialysis: Secondary | ICD-10-CM

## 2022-10-15 DIAGNOSIS — I160 Hypertensive urgency: Secondary | ICD-10-CM | POA: Diagnosis not present

## 2022-10-15 DIAGNOSIS — I2699 Other pulmonary embolism without acute cor pulmonale: Secondary | ICD-10-CM | POA: Diagnosis not present

## 2022-10-15 DIAGNOSIS — K219 Gastro-esophageal reflux disease without esophagitis: Secondary | ICD-10-CM

## 2022-10-15 DIAGNOSIS — N186 End stage renal disease: Secondary | ICD-10-CM

## 2022-10-15 DIAGNOSIS — R0600 Dyspnea, unspecified: Secondary | ICD-10-CM | POA: Diagnosis not present

## 2022-10-15 HISTORY — DX: Dyspnea, unspecified: R06.00

## 2022-10-15 LAB — BASIC METABOLIC PANEL
Anion gap: 22 — ABNORMAL HIGH (ref 5–15)
BUN: 72 mg/dL — ABNORMAL HIGH (ref 8–23)
CO2: 18 mmol/L — ABNORMAL LOW (ref 22–32)
Calcium: 6.2 mg/dL — CL (ref 8.9–10.3)
Chloride: 103 mmol/L (ref 98–111)
Creatinine, Ser: 9.95 mg/dL — ABNORMAL HIGH (ref 0.61–1.24)
GFR, Estimated: 5 mL/min — ABNORMAL LOW (ref >60)
Glucose, Bld: 92 mg/dL (ref 70–99)
Potassium: 4.6 mmol/L (ref 3.5–5.1)
Sodium: 143 mmol/L (ref 135–145)

## 2022-10-15 LAB — CBC
HCT: 31.7 % — ABNORMAL LOW (ref 39.0–52.0)
Hemoglobin: 9.8 g/dL — ABNORMAL LOW (ref 13.0–17.0)
MCH: 27.8 pg (ref 26.0–34.0)
MCHC: 30.9 g/dL (ref 30.0–36.0)
MCV: 89.8 fL (ref 80.0–100.0)
Platelets: 232 10*3/uL (ref 150–400)
RBC: 3.53 MIL/uL — ABNORMAL LOW (ref 4.22–5.81)
RDW: 16.5 % — ABNORMAL HIGH (ref 11.5–15.5)
WBC: 8.4 10*3/uL (ref 4.0–10.5)
nRBC: 0 % (ref 0.0–0.2)

## 2022-10-15 LAB — PROTIME-INR
INR: 1.1 (ref 0.8–1.2)
Prothrombin Time: 14.3 s (ref 11.4–15.2)

## 2022-10-15 LAB — TROPONIN I (HIGH SENSITIVITY)
Troponin I (High Sensitivity): 112 ng/L (ref <18)
Troponin I (High Sensitivity): 127 ng/L (ref <18)
Troponin I (High Sensitivity): 127 ng/L (ref <18)

## 2022-10-15 LAB — HEPARIN LEVEL (UNFRACTIONATED): Heparin Unfractionated: <0.1 IU/mL — ABNORMAL LOW (ref 0.30–0.70)

## 2022-10-15 LAB — HEPATITIS B SURFACE ANTIGEN: Hepatitis B Surface Ag: NONREACTIVE

## 2022-10-15 LAB — APTT: aPTT: 33 s (ref 24–36)

## 2022-10-15 LAB — C-REACTIVE PROTEIN: CRP: 2.3 mg/dL — ABNORMAL HIGH (ref <1.0)

## 2022-10-15 MED ORDER — ONDANSETRON HCL 4 MG/2ML IJ SOLN: 4.0000 mg | Freq: Four times a day (QID) | INTRAMUSCULAR | Status: DC | PRN

## 2022-10-15 MED ORDER — ASPIRIN 81 MG PO TBEC
81.0000 mg | DELAYED_RELEASE_TABLET | Freq: Every day | ORAL | Status: DC
  Administered 2022-10-15 – 2022-10-17 (×3): 81 mg via ORAL
  Filled 2022-10-15 (×3): qty 1

## 2022-10-15 MED ORDER — ISOSORBIDE DINITRATE 10 MG PO TABS: 10.0000 mg | ORAL_TABLET | Freq: Three times a day (TID) | ORAL | 0 refills | Status: DC

## 2022-10-15 MED ORDER — TRAZODONE HCL 50 MG PO TABS: 25.0000 mg | ORAL_TABLET | Freq: Every evening | ORAL | Status: DC | PRN

## 2022-10-15 MED ORDER — ACETAMINOPHEN 650 MG RE SUPP: 650.0000 mg | Freq: Four times a day (QID) | RECTAL | Status: DC | PRN

## 2022-10-15 MED ORDER — METOPROLOL SUCCINATE ER 25 MG PO TB24
25.0000 mg | ORAL_TABLET | Freq: Every day | ORAL | Status: DC
  Administered 2022-10-15 – 2022-10-17 (×3): 25 mg via ORAL
  Filled 2022-10-15 (×3): qty 1

## 2022-10-15 MED ORDER — MAGNESIUM HYDROXIDE 400 MG/5ML PO SUSP: 30.0000 mL | Freq: Every day | ORAL | Status: DC | PRN

## 2022-10-15 MED ORDER — GUAIFENESIN ER 600 MG PO TB12: 600.0000 mg | ORAL_TABLET | Freq: Two times a day (BID) | ORAL | 0 refills | Status: DC

## 2022-10-15 MED ORDER — ONDANSETRON HCL 4 MG PO TABS: 4.0000 mg | ORAL_TABLET | Freq: Four times a day (QID) | ORAL | Status: DC | PRN

## 2022-10-15 MED ORDER — ZINC SULFATE 220 (50 ZN) MG PO CAPS: 220.0000 mg | ORAL_CAPSULE | Freq: Every day | ORAL | 1 refills | Status: DC

## 2022-10-15 MED ORDER — ACETAMINOPHEN 325 MG PO TABS: 650.0000 mg | ORAL_TABLET | Freq: Four times a day (QID) | ORAL | Status: DC | PRN

## 2022-10-15 MED ORDER — AMLODIPINE BESYLATE 5 MG PO TABS
2.5000 mg | ORAL_TABLET | Freq: Every day | ORAL | Status: DC
  Administered 2022-10-15 – 2022-10-17 (×3): 2.5 mg via ORAL
  Filled 2022-10-15 (×3): qty 1

## 2022-10-15 MED ORDER — CHLORHEXIDINE GLUCONATE CLOTH 2 % EX PADS
6.0000 | MEDICATED_PAD | Freq: Every day | CUTANEOUS | Status: DC
  Administered 2022-10-15: 6 via TOPICAL

## 2022-10-15 MED ORDER — HEPARIN SODIUM (PORCINE) 1000 UNIT/ML IJ SOLN
INTRAMUSCULAR | Status: AC
  Filled 2022-10-15: qty 10

## 2022-10-15 MED ORDER — GUAIFENESIN ER 600 MG PO TB12
600.0000 mg | ORAL_TABLET | Freq: Two times a day (BID) | ORAL | Status: DC
  Administered 2022-10-15 – 2022-10-17 (×6): 600 mg via ORAL
  Filled 2022-10-15 (×6): qty 1

## 2022-10-15 MED ORDER — BENZONATATE 100 MG PO CAPS: 100.0000 mg | ORAL_CAPSULE | Freq: Three times a day (TID) | ORAL | 0 refills | Status: DC | PRN

## 2022-10-15 MED ORDER — CALCIUM ACETATE (PHOS BINDER) 667 MG PO CAPS
667.0000 mg | ORAL_CAPSULE | Freq: Three times a day (TID) | ORAL | Status: DC
  Administered 2022-10-15 – 2022-10-17 (×5): 667 mg via ORAL
  Filled 2022-10-15 (×5): qty 1

## 2022-10-15 MED ORDER — APIXABAN 5 MG PO TABS
5.0000 mg | ORAL_TABLET | Freq: Two times a day (BID) | ORAL | Status: DC
  Administered 2022-10-15 – 2022-10-17 (×5): 5 mg via ORAL
  Filled 2022-10-15 (×5): qty 1

## 2022-10-15 MED ORDER — VITAMIN C 500 MG PO TABS
250.0000 mg | ORAL_TABLET | Freq: Every day | ORAL | Status: DC
  Administered 2022-10-15 – 2022-10-17 (×3): 250 mg via ORAL
  Filled 2022-10-15 (×3): qty 1

## 2022-10-15 MED ORDER — ISOSORBIDE DINITRATE 10 MG PO TABS
10.0000 mg | ORAL_TABLET | Freq: Three times a day (TID) | ORAL | Status: DC
  Administered 2022-10-15 – 2022-10-17 (×5): 10 mg via ORAL
  Filled 2022-10-15 (×8): qty 1

## 2022-10-15 MED ORDER — HYDROCOD POLI-CHLORPHE POLI ER 10-8 MG/5ML PO SUER: 5.0000 mL | Freq: Two times a day (BID) | ORAL | Status: DC | PRN

## 2022-10-15 MED ORDER — CALCITRIOL 0.25 MCG PO CAPS: 0.5000 ug | ORAL_CAPSULE | Freq: Every day | ORAL | Status: DC

## 2022-10-15 MED ORDER — ONDANSETRON 4 MG PO TBDP: 4.0000 mg | ORAL_TABLET | Freq: Three times a day (TID) | ORAL | Status: DC | PRN

## 2022-10-15 MED ORDER — ALTEPLASE 2 MG IJ SOLR: 2.0000 mg | Freq: Once | INTRAMUSCULAR | Status: DC | PRN

## 2022-10-15 MED ORDER — ASCORBIC ACID 250 MG PO TABS: 500.0000 mg | ORAL_TABLET | Freq: Every day | ORAL | 0 refills | Status: DC

## 2022-10-15 MED ORDER — LABETALOL HCL 5 MG/ML IV SOLN
20.0000 mg | INTRAVENOUS | Status: DC | PRN
  Administered 2022-10-15: 20 mg via INTRAVENOUS
  Filled 2022-10-15: qty 4

## 2022-10-15 MED ORDER — HEPARIN BOLUS VIA INFUSION
1900.0000 [IU] | Freq: Once | INTRAVENOUS | Status: AC
  Administered 2022-10-15: 1900 [IU] via INTRAVENOUS
  Filled 2022-10-15: qty 1900

## 2022-10-15 MED ORDER — HEPARIN SODIUM (PORCINE) 1000 UNIT/ML DIALYSIS: 1000.0000 [IU] | INTRAMUSCULAR | Status: DC | PRN

## 2022-10-15 MED ORDER — PANTOPRAZOLE SODIUM 40 MG PO TBEC
40.0000 mg | DELAYED_RELEASE_TABLET | Freq: Every day | ORAL | Status: DC
  Administered 2022-10-15 – 2022-10-17 (×3): 40 mg via ORAL
  Filled 2022-10-15 (×3): qty 1

## 2022-10-15 MED ORDER — ZINC SULFATE 220 (50 ZN) MG PO CAPS
220.0000 mg | ORAL_CAPSULE | Freq: Every day | ORAL | Status: DC
  Administered 2022-10-15 – 2022-10-17 (×3): 220 mg via ORAL
  Filled 2022-10-15 (×3): qty 1

## 2022-10-15 MED ORDER — HEPARIN (PORCINE) 25000 UT/250ML-% IV SOLN
1000.0000 [IU]/h | INTRAVENOUS | Status: DC
  Administered 2022-10-15: 1000 [IU]/h via INTRAVENOUS
  Filled 2022-10-15: qty 250

## 2022-10-15 NOTE — Care Management Obs Status (Signed)
MEDICARE OBSERVATION STATUS NOTIFICATION   Patient Details  Name: Anthony Hess MRN: [id number] Date of Birth: 01-22-1956   Medicare Observation Status Notification Given:  Yes    Darolyn Rua, LCSW 10/15/2022, 3:30 PM

## 2022-10-15 NOTE — Care Management CC44 (Signed)
Condition Code 44 Documentation Completed  Patient Details  Name: Anthony Hess MRN: [id number] Date of Birth: 1955-08-25   Condition Code 44 given:  Yes Patient signature on Condition Code 44 notice:  Yes Documentation of 2 MD's agreement:  Yes Code 44 added to claim:  Yes    Darolyn Rua, LCSW 10/15/2022, 3:30 PM

## 2022-10-15 NOTE — Progress Notes (Signed)
Hemodialysis Note  Received patient in bed to unit. Alert and oriented. Informed consent signed and in chart.   Treatment initiated:1514 Treatment completed:1918  Patient tolerated treatment well. Transported back to the room alert, without acute distress. Report given to patient's RN.  Access used: RIJ Catheter  Access issues: Arterial lumen poorly functioning, unable to pull, lines reversed during treatment. Treatment paused to flushed lines and recirculate circuit, intervention successful.  Total UF removed:2494.5 ml Medications given: Heparin  Post HD ZO:[id number]  Post HD weight:49.9 kg standing   Lupita Raider [facility], [place]

## 2022-10-15 NOTE — Assessment & Plan Note (Signed)
-   We will continue PPI therapy 

## 2022-10-15 NOTE — Plan of Care (Signed)

## 2022-10-15 NOTE — Assessment & Plan Note (Addendum)
-   While this was shown on a previous CTA and is nonocclusive, and should have been resolved by now being on Eliquis raising the concern for recurrent PE?? Patient remained asymptomatic and on room air. -Discontinue heparin infusion and restarting home Eliquis -Outpatient follow-up with PCP

## 2022-10-15 NOTE — Assessment & Plan Note (Addendum)
-   She is on hemodialysis on TTS. -Patient received dialysis and will continue with his routine as outpatient

## 2022-10-15 NOTE — H&P (Addendum)
[place]   PATIENT NAME: Anthony Hess    MR#:  [id number]  DATE OF BIRTH:  03/18/1955  DATE OF ADMISSION:  10/14/2022  PRIMARY CARE PHYSICIAN: Ronnald Ramp, MD   Patient is coming from: Home  REQUESTING/REFERRING PHYSICIAN: Ward, Stephanie Acre, DO  CHIEF COMPLAINT:   Chief Complaint  Patient presents with   Shortness of Breath    Pt is currently on dialysis but has not been since Saturday due to transportation issues. Pt denies any home medication use. Pt c/o sob that started a few hours ago after smoking a cigarette. Pt is a and o x4. Resp are even and unlabored. Pt is 97% on RA.     HISTORY OF PRESENT ILLNESS:  Anthony Hess is a 67 y.o. male with medical history significant for end-stage renal disease on hemodialysis on TTS, missed Thursday and Tuesday as he could not get a ride per his report, HFrEF, dilated cardiomyopathy, bilateral carotid artery disease, dyslipidemia, hypertension and coronary artery disease status post CABG, who presented to the ER with acute onset of dyspnea that started today.  He denied any orthopnea or paroxysmal nocturnal dyspnea or worsening lower extremity edema.  No chest pain or palpitations.  No cough or wheezing or hemoptysis.  No fever or chills.  He admitted to right leg pain.  No headache or dizziness or blurred vision.  No recent travels or surgeries.  ED Course: When he came to the ER, BP was 167/122 with otherwise normal vital signs.  Later on BP was 170/128.  COVID-19 came back positive.  CBC showed potassium 5.2 and calcium 6 with a BUN of 67 and creatinine 9.77.  High-sensitivity troponin I was 118 and later 127.  CBC showed anemia close to baseline.  Coag profile was within normal. EKG as reviewed by me : EKG showed likely ectopic rhythm with a rate of 77 and IVCD and T wave inversion laterally. Imaging: Two-view chest x-ray showed no acute cardiopulmonary disease. CTA of the chest revealed the following: 1. Tiny  nonocclusive subsegmental pulmonary embolism in a left lower lobe posterior artery. This is similar to CT abdomen and pelvis 08/10/2022. No right heart strain. 2. Advanced emphysema and aortic atherosclerosis.  The patient was given IV heparin to replace Eliquis, 1 g of IV calcium gluconate and I added 20 mg of IV labetalol.  He will be admitted to a progressive unit bed for further evaluation and management. PAST MEDICAL HISTORY:   Past Medical History:  Diagnosis Date   Aortic atherosclerosis (HCC)    Bilateral carotid artery disease (HCC)    Bladder cancer (HCC)    Coronary artery disease 12/20/2018   a.) LHC 12/20/2018: 50% OM1, 40% OM2, 95% o-pLAD, 75% o=pLCx, 40% mLM, 70% D1, 60% mRCA-1, 50% mRCA-2; refer to CVTS. b.) 4v CABG at [facility] on 12/27/2018: LIMA-LAD, RIMA-PDA, seg LRA-OM1-D1   DCM (dilated cardiomyopathy) (HCC) 12/05/2018   a.) TTE 12/05/2018: EF 40-45%. b.) TTE 12/28/2019: EF 20-25%.   Dyspnea 10/15/2022   ESRD (end stage renal disease) (HCC)    a.) T-Th-Sat   HFrEF (heart failure with reduced ejection fraction) (HCC) 12/05/2018   a.) TTE 12/05/2018: EF 40-45%; mild LVH; ant/apical/sep HK; mild TR . b.) TTE 12/28/2019: EF 20-25%; mod LVH; mod MR/AR; G1DD.   History of 2019 novel coronavirus disease (COVID-19) 04/08/2021   History of kidney stones    HLD (hyperlipidemia)    Hx of CABG 12/27/2018   Hypertension    Infrarenal abdominal  aortic aneurysm (AAA) without rupture (HCC) 03/05/2021   a.) CT abd/pelvis; measured 3.2 cm.   Melena 05/04/2022   Myocardial infarction [facility])    NSTEMI (non-ST elevated myocardial infarction) (HCC) 04/19/2022   Perforation bowel (HCC) 04/26/2022   PVD (peripheral vascular disease) (HCC)    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   a.) LIMA-LAD, RIMA-PDA, sequential LEFT radial artery to OM1 and D1   Sepsis (HCC) 03/14/2021   Wears glasses     PAST SURGICAL HISTORY:   Past Surgical History:  Procedure Laterality Date   AV FISTULA PLACEMENT Left  07/30/2021   Procedure: INSERTION OF ARTERIOVENOUS (AV) GORE-TEX GRAFT ARM BRACHIAL ARTERY TO AXILLARY VEIN;  Surgeon: Annice Needy, MD;  Location: [facility] ORS;  Service: Vascular;  Laterality: Left;   CAPD INSERTION N/A 12/31/2019   Procedure: LAPAROSCOPIC INSERTION CONTINUOUS AMBULATORY PERITONEAL DIALYSIS  (CAPD) CATHETER;  Surgeon: Leafy Ro, MD;  Location: [facility] ORS;  Service: General;  Laterality: N/A;   CAPD REMOVAL N/A 04/10/2020   Procedure: LAPAROSCOPIC REVISION OF CONTINUOUS AMBULATORY PERITONEAL DIALYSIS  (CAPD) CATHETER;  Surgeon: Leafy Ro, MD;  Location: [facility] ORS;  Service: General;  Laterality: N/A;   CORONARY ARTERY BYPASS GRAFT N/A 12/27/2018   Procedure: CORONARY ARTERY BYPASS GRAFTING (CABG) X 4 ON PUMP USING RIGHT & LEFT INTERNAL MAMMARY ARTERY LEFT RADIAL ARTERY ENDOSCOPICALLY HARVESTED;  Surgeon: Linden Dolin, MD;  Location: [facility];  Service: Open Heart Surgery;  Laterality: N/A;   CYSTOSCOPY W/ RETROGRADES Bilateral 05/15/2019   Procedure: CYSTOSCOPY WITH RETROGRADE PYELOGRAM;  Surgeon: Riki Altes, MD;  Location: [facility] OR[facility];  Service: Urology;  Laterality: Bilateral;   CYSTOSCOPY WITH BIOPSY N/A 05/15/2019   Procedure: CYSTOSCOPY WITH bladder BIOPSY;  Surgeon: Riki Altes, MD;  Location: [facility] OR[facility];  Service: Urology;  Laterality: N/A;   DIALYSIS/PERMA CATHETER INSERTION N/A 12/28/2019   Procedure: DIALYSIS/PERMA CATHETER INSERTION;  Surgeon: Annice Needy, MD;  Location: [facility];  Service: Cardiovascular;  Laterality: N/A;   DIALYSIS/PERMA CATHETER INSERTION N/A 03/18/2021   Procedure: DIALYSIS/PERMA CATHETER INSERTION;  Surgeon: Annice Needy, MD;  Location: [facility];  Service: Cardiovascular;  Laterality: N/A;   DIALYSIS/PERMA CATHETER REMOVAL N/A 06/02/2020   Procedure: DIALYSIS/PERMA CATHETER REMOVAL;  Surgeon: Annice Needy, MD;  Location: [facility];  Service: Cardiovascular;  Laterality: N/A;   EXCHANGE OF A DIALYSIS CATHETER  Right 04/10/2020   Procedure: EXCHANGE OF A DIALYSIS CATHETER;  Surgeon: Leafy Ro, MD;  Location: [facility];  Service: General;  Laterality: Right;   INCISIONAL HERNIA REPAIR  01/20/2021   Procedure: HERNIA REPAIR INCISIONAL;  Surgeon: Henrene Dodge, MD;  Location: [facility];  Service: General;;   IR IMAGE GUIDED DRAINAGE PERCUT CATH  PERITONEAL RETROPERIT  04/07/2020   LAPAROSCOPY N/A 04/16/2021   Procedure: LAPAROSCOPY DIAGNOSTIC;  Surgeon: Sung Amabile, DO;  Location: [facility];  Service: General;  Laterality: N/A;   LAPAROTOMY N/A 04/26/2022   Procedure: EXPLORATORY LAPAROTOMY WITH REPAIR OF DUODENAL ULCER;  Surgeon: Carolan Shiver, MD;  Location: [facility];  Service: General;  Laterality: N/A;   LEFT HEART CATH AND CORONARY ANGIOGRAPHY Left 12/20/2018   Procedure: LEFT HEART CATH AND CORONARY ANGIOGRAPHY;  Surgeon: Marcina Millard, MD;  Location: [facility];  Service: Cardiovascular;  Laterality: Left;   RADIAL ARTERY HARVEST Left 12/27/2018   Procedure: ENDOSCOPIC RADIAL ARTERY HARVEST;  Surgeon: Linden Dolin, MD;  Location: [facility];  Service: Open Heart Surgery;  Laterality: Left;  REMOVAL OF A DIALYSIS CATHETER Left 03/20/2021   Procedure: REMOVAL OF A PD CATHETER;  Surgeon: Annice Needy, MD;  Location: [facility];  Service: Vascular;  Laterality: Left;   REVISION OF ARTERIOVENOUS GORETEX GRAFT Left 09/11/2021   Procedure: Excisionof infected AV graft;  Surgeon: Renford Dills, MD;  Location: [facility];  Service: Vascular;  Laterality: Left;   TEE WITHOUT CARDIOVERSION N/A 12/27/2018   Procedure: TRANSESOPHAGEAL ECHOCARDIOGRAM (TEE);  Surgeon: Linden Dolin, MD;  [facility];  Service: Open Heart Surgery;  Laterality: N/A;   TUMOR REMOVAL  2019   Bladder    SOCIAL HISTORY:   Social History   Tobacco Use   Smoking status: Every Day    Current packs/day: 0.00    Types: Cigarettes    Last attempt to quit: 02/19/2019    Years since quitting: 3.6    Smokeless tobacco: Never  Substance Use Topics   Alcohol use: Never    FAMILY HISTORY:   Family History  Problem Relation Age of Onset   Heart failure Mother     DRUG ALLERGIES:  No Known Allergies  REVIEW OF SYSTEMS:   ROS As per history of present illness. All pertinent systems were reviewed above. Constitutional, HEENT, cardiovascular, respiratory, GI, GU, musculoskeletal, neuro, psychiatric, endocrine, integumentary and hematologic systems were reviewed and are otherwise negative/unremarkable except for positive findings mentioned above in the HPI.   MEDICATIONS AT HOME:   Prior to Admission medications   Medication Sig Start Date End Date Taking? Authorizing Provider  amLODipine (NORVASC) 2.5 MG tablet Take 1 tablet (2.5 mg total) by mouth daily. 05/07/22   Alford Highland, MD  apixaban (ELIQUIS) 5 MG TABS tablet Take 1 tablet (5 mg total) by mouth 2 (two) times daily. 05/01/22   Baron Hamper, MD  aspirin EC 81 MG tablet Take 81 mg by mouth daily. 05/06/20   [provider]  calcitRIOL (ROCALTROL) 0.5 MCG capsule Take 1 capsule (0.5 mcg total) by mouth daily. 11/18/21   Sunnie Nielsen, DO  calcium acetate (PHOSLO) 667 MG capsule Take 1 capsule (667 mg total) by mouth 3 (three) times daily with meals. 11/18/21   Sunnie Nielsen, DO  isosorbide dinitrate (ISORDIL) 10 MG tablet Take 1 tablet (10 mg total) by mouth 3 (three) times daily. 04/21/22   Lurene Shadow, MD  metoprolol succinate (TOPROL-XL) 25 MG 24 hr tablet Take 25 mg by mouth daily. 03/12/22   [provider]  ondansetron (ZOFRAN-ODT) 4 MG disintegrating tablet Take 1 tablet (4 mg total) by mouth every 8 (eight) hours as needed for nausea or vomiting. 06/25/22   Simmons-Robinson, Makiera, MD  pantoprazole (PROTONIX) 40 MG tablet Take 1 tablet (40 mg total) by mouth daily. 05/07/22 06/06/22  Alford Highland, MD      VITAL SIGNS:  Blood pressure (!) 152/78, pulse 72, temperature 98.2 F (36.8 C),  temperature source Oral, resp. rate 18, height 5\' 3"  (1.6 m), weight 62.1 kg, SpO2 94%.  PHYSICAL EXAMINATION:  Physical Exam  GENERAL:  67 y.o.-year-old male patient lying in the bed with no acute distress.  EYES: Pupils equal, round, reactive to light and accommodation. No scleral icterus. Extraocular muscles intact.  HEENT: Head atraumatic, normocephalic. Oropharynx and nasopharynx clear.  NECK:  Supple, no jugular venous distention. No thyroid enlargement, no tenderness.  LUNGS: Normal breath sounds bilaterally, no wheezing, rales,rhonchi or crepitation. No use of accessory muscles of respiration.  CARDIOVASCULAR: Regular rate and rhythm, S1, S2 normal. No murmurs, rubs, or gallops.  ABDOMEN: Soft, nondistended, nontender. Bowel sounds present. No organomegaly or mass.  EXTREMITIES: No pedal edema, cyanosis, or clubbing.  NEUROLOGIC: Cranial nerves II through XII are intact. Muscle strength 5/5 in all extremities. Sensation intact. Gait not checked.  PSYCHIATRIC: The patient is alert and oriented x 3.  Normal affect and good eye contact. SKIN: No obvious rash, lesion, or ulcer.   LABORATORY PANEL:   CBC Recent Labs  Lab 10/15/22 0431  WBC 8.4  HGB 9.8*  HCT 31.7*  PLT 232   ------------------------------------------------------------------------------------------------------------------  Chemistries  Recent Labs  Lab 10/13/22 1539 10/13/22 1815 10/14/22 2229  NA 138   < > 138  K 6.1*   < > 5.2*  CL 98   < > 98  CO2 23  --  17*  GLUCOSE 115*   < > 72  BUN 48*   < > 67*  CREATININE 8.63*   < > 9.77*  CALCIUM 7.3*  --  6.0*  MG 2.3  --   --   AST 20  --   --   ALT 26  --   --   ALKPHOS 60  --   --   BILITOT 0.5  --   --    < > = values in this interval not displayed.   ------------------------------------------------------------------------------------------------------------------  Cardiac Enzymes No results for input(s): "TROPONINI" in the last 168  hours. ------------------------------------------------------------------------------------------------------------------  RADIOLOGY:  CT Angio Chest PE W and/or Wo Contrast  Result Date: 10/14/2022 CLINICAL DATA:  Shortness of breath EXAM: CT ANGIOGRAPHY CHEST WITH CONTRAST TECHNIQUE: Multidetector CT imaging of the chest was performed using the standard protocol during bolus administration of intravenous contrast. Multiplanar CT image reconstructions and MIPs were obtained to evaluate the vascular anatomy. RADIATION DOSE REDUCTION: This exam was performed according to the departmental dose-optimization program which includes automated exposure control, adjustment of the mA and/or kV according to patient size and/or use of iterative reconstruction technique. CONTRAST:  75mL OMNIPAQUE IOHEXOL 350 MG/ML SOLN COMPARISON:  Chest radiograph 10/14/2022 and CTA chest 04/19/2022 FINDINGS: Cardiovascular: Tiny nonocclusive subsegmental pulmonary embolism in a left lower lobe posterior artery (4/95). This is similar in location to the pulmonary embolism seen on CT abdomen and pelvis 08/10/2022. No right heart strain. Sternotomy and CABG. Coronary artery and aortic atherosclerotic calcification. No pericardial effusion. Mediastinum/Nodes: Trachea and esophagus are unremarkable. No thoracic adenopathy. Lungs/Pleura: Advanced emphysema. Biapical pleural-parenchymal scarring. No focal pneumonia. No pleural effusion or pneumothorax. Upper Abdomen: No acute abnormality. Cysts/hemangiomas in the liver better characterized on prior imaging. Musculoskeletal: No acute fracture. Subacute or chronic right anterior third rib fracture. Review of the MIP images confirms the above findings. IMPRESSION: 1. Tiny nonocclusive subsegmental pulmonary embolism in a left lower lobe posterior artery. This is similar to CT abdomen and pelvis 08/10/2022. No right heart strain. 2. Advanced emphysema. Aortic Atherosclerosis (ICD10-I70.0) and  Emphysema (ICD10-J43.9). Critical Value/emergent results were called by telephone at the time of interpretation on 10/14/2022 at 11:46 pm to provider [facility] , who verbally acknowledged these results. Electronically Signed   By: Minerva Fester M.D.   On: 10/14/2022 23:49   DG Chest 2 View  Result Date: 10/14/2022 CLINICAL DATA:  Shortness of breath EXAM: CHEST - 2 VIEW COMPARISON:  Chest x-ray 10/13/2022 FINDINGS: Right-sided central venous catheter tip ends in the cavoatrial junction. Sternotomy wires are present. The heart size and mediastinal contours are within normal limits. Both lungs are clear. Acute fractures are seen. There is dextroconvex curvature of the thoracic spine.  IMPRESSION: No active cardiopulmonary disease. Electronically Signed   By: Darliss Cheney M.D.   On: 10/14/2022 22:53   DG Chest 2 View  Result Date: 10/13/2022 CLINICAL DATA:  Shortness of breath. EXAM: CHEST - 2 VIEW COMPARISON:  September 20, 2022. FINDINGS: The heart size and mediastinal contours are within normal limits. Both lungs are clear. Stable right-sided dialysis catheter. Status post coronary artery bypass graft. Moderate dextroscoliosis of thoracic spine. IMPRESSION: No active cardiopulmonary disease. Electronically Signed   By: Lupita Raider M.D.   On: 10/13/2022 16:14      IMPRESSION AND PLAN:  Assessment and Plan: * Dyspnea - This could be related to his pulm embolism as well as slight fluid overload though his chest CTA and chest x-ray showed no pulmonary edema. - It could be partly related to COVID-19 though he is not hypoxic with it. - He will be admitted to a progressive unit bed. - We will monitor his pulse oximetry. - Management otherwise as below.  Acute pulmonary embolism (HCC) - While this was shown on a previous CTA and is nonocclusive, and should have been resolved by now being on Eliquis raising the concern for recurrent PE.. - Will therefore switch him to IV heparin while he is  here.  Hypertensive urgency - We will continue his antihypertensives. - He will be placed on as needed IV labetalol and hydralazine.  End-stage renal disease on hemodialysis [facility]) - She is on hemodialysis on TTS. - Nephrology consult will be obtained.  I notified Dr. Cherylann Ratel about him. - His potassium is only 5.2 and will be followed.   COVID-19 virus infection - I believe that this was an accidental finding. - The patient has no CT findings of COVID-pneumonia. - He has no current hypoxia. - Conservative management will be pursued. - We will place him on mucolytic therapy and monitor pulse oximetry. 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 - He will be placed on isolation,. - Will add vitamin C and zinc sulfate.  GERD without esophagitis - We will continue PPI therapy.   DVT prophylaxis: IV heparin. Advanced Care Planning:  Code Status: full code. Family Communication:  The plan of care was discussed in details with the patient (and family). I answered all questions. The patient agreed to proceed with the above mentioned plan. Further management will depend upon hospital course. Disposition Plan: Back to previous home environment Consults called: Nephrology All the records are reviewed and case discussed with ED provider.  Status is: Inpatient   At the time of the admission, it appears that the appropriate admission status for this patient is inpatient.  This is judged to be reasonable and necessary in order to provide the required intensity of service to ensure the patient's safety given the presenting symptoms, physical exam findings and initial radiographic and laboratory data in the context of comorbid conditions.  The patient requires inpatient status due to high intensity of service, high risk of further deterioration and high frequency of surveillance required.  I certify that at the time of admission, it is my clinical judgment that the patient will  require inpatient hospital care extending more than 2 midnights.                            Dispo: The patient is from: Home              Anticipated d/c is to: Home  Patient currently is not medically stable to d/c.              Difficult to place patient: No  Hannah Beat M.D on 10/15/2022 at 5:35 AM  [facility]   From 7 PM-7 AM, contact night-coverage [email or web address]  CC: Primary care physician; Ronnald Ramp, MD

## 2022-10-15 NOTE — Assessment & Plan Note (Addendum)
-   I believe that this was an accidental finding. - The patient has no CT findings of COVID-pneumonia. - He has no current hypoxia. - Conservative management will be pursued. - We will place him on mucolytic therapy and monitor pulse oximetry. - He will be placed on isolation,. - Will add vitamin C and zinc sulfate.

## 2022-10-15 NOTE — Assessment & Plan Note (Signed)
-   We will continue his antihypertensives. - He will be placed on as needed IV labetalol and hydralazine.

## 2022-10-15 NOTE — Progress Notes (Signed)
ANTICOAGULATION CONSULT NOTE  Pharmacy Consult for heparin infusion Indication: pulmonary embolus  No Known Allergies  Patient Measurements: Height: 5\' 3"  (160 cm) Weight: 62.1 kg (137 lb) IBW/kg (Calculated) : 56.9 Heparin Dosing Weight: 62.1 kg  Vital Signs: Temp: 98.2 F (36.8 C) (08/23 0144) Temp Source: Oral (08/23 0144) BP: 158/92 (08/23 0144) Pulse Rate: 63 (08/23 0144)  Labs: Recent Labs    10/13/22 1539 10/13/22 1815 10/14/22 2229 10/15/22 0011  HGB 12.1* 11.6* 10.7*  --   HCT 40.6 34.0* 35.8*  --   PLT 223  --  202  --   APTT  --   --   --  33  LABPROT  --   --   --  14.3  INR  --   --   --  1.1  HEPARINUNFRC  --   --   --  <0.10*  CREATININE 8.63* 9.30* 9.77*  --   TROPONINIHS  --   --  118* 127*    Estimated Creatinine Clearance: 6.1 mL/min (A) (by C-G formula based on SCr of 9.77 mg/dL (H)).   Medical History: Past Medical History:  Diagnosis Date   Aortic atherosclerosis (HCC)    Bilateral carotid artery disease (HCC)    Bladder cancer (HCC)    Coronary artery disease 12/20/2018   a.) LHC 12/20/2018: 50% OM1, 40% OM2, 95% o-pLAD, 75% o=pLCx, 40% mLM, 70% D1, 60% mRCA-1, 50% mRCA-2; refer to CVTS. b.) 4v CABG at [facility] on 12/27/2018: LIMA-LAD, RIMA-PDA, seg LRA-OM1-D1   DCM (dilated cardiomyopathy) (HCC) 12/05/2018   a.) TTE 12/05/2018: EF 40-45%. b.) TTE 12/28/2019: EF 20-25%.   Dyspnea 10/15/2022   ESRD (end stage renal disease) (HCC)    a.) T-Th-Sat   HFrEF (heart failure with reduced ejection fraction) (HCC) 12/05/2018   a.) TTE 12/05/2018: EF 40-45%; mild LVH; ant/apical/sep HK; mild TR . b.) TTE 12/28/2019: EF 20-25%; mod LVH; mod MR/AR; G1DD.   History of 2019 novel coronavirus disease (COVID-19) 04/08/2021   History of kidney stones    HLD (hyperlipidemia)    Hx of CABG 12/27/2018   Hypertension    Infrarenal abdominal aortic aneurysm (AAA) without rupture (HCC) 03/05/2021   a.) CT abd/pelvis; measured 3.2 cm.   Melena 05/04/2022    Myocardial infarction [facility])    NSTEMI (non-ST elevated myocardial infarction) (HCC) 04/19/2022   Perforation bowel (HCC) 04/26/2022   PVD (peripheral vascular disease) (HCC)    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   a.) LIMA-LAD, RIMA-PDA, sequential LEFT radial artery to OM1 and D1   Sepsis (HCC) 03/14/2021   Wears glasses     Medications:  PTA Meds: Apixaban 5 mg BID  Assessment: Pt is a 67 yo male w/ ERSD on HD presenting to ED c/o SOB, found with "tiny nonocclusive subsegmental pulmonary embolism in a left lower lobe posterior artery."  Goal of Therapy:  Heparin level 0.3-0.7 units/ml aPTT 66-102 seconds Monitor platelets by anticoagulation protocol: Yes   08/23 0011 Baseline HL < 0.10, will monitor using heparin levels  Plan:  Continue heparin infusion at 1000 units/hr Will check HL in 8 hr after start of infusion CBC daily while on heparin  Otelia Sergeant, PharmD, [facility] 10/15/2022 2:21 AM

## 2022-10-15 NOTE — Progress Notes (Signed)
[facility]  ROUNDING NOTE   Subjective:   Anthony Hess is a 67 year old male with past medical conditions including hypertension, PVD, dyslipidemia, CAD, four-vessel CABG, and end-stage renal disease on hemodialysis. Patient presents to the emergency department with complaints of shortness of breath. Paietn has been admitted for Hypocalcemia [E83.51] Shortness of breath [R06.02] Dyspnea [R06.00] Pulmonary embolism, other, unspecified chronicity, unspecified whether acute cor pulmonale present (HCC) [I26.99] COVID-19 [U07.1]  Patient is known to our practice and receives outpatient dialysis at [facility] on a TTS schedule, followed by Dr Cherylann Ratel. Last treatment received on Tuesday, this was confirmed with outpatient clinic. Patient missed treatment on Thursday due to transportation. States he began having shortness of breath during the previous day. Patient seen resting comfortably in bed. Room air, speaking in complete sentences. No lower extremity edema noted.   Labs on ED arrival significant for BUN 72, creatinine 9.95 with GFR 5 and calcium 6.2. Troponins stable and hemoglobin 9.8. Patient found to be covid positive. Chest x ray negative for acute findings. CT angio chest shows small pulmonary embolism in left lower lobe and emphysema.   We have been consulted to manage dialysis needs.   Objective:  Vital signs in last 24 hours:  Temp:  [97.8 F (36.6 C)-98.4 F (36.9 C)] 98.4 F (36.9 C) (08/23 1100) Pulse Rate:  [62-101] 62 (08/23 1100) Resp:  [17-20] 19 (08/23 1100) BP: (144-168)/(78-131) 144/95 (08/23 1100) SpO2:  [94 %-98 %] 95 % (08/23 1100) Weight:  [62.1 kg] 62.1 kg (08/22 2226)  Weight change:  Filed Weights   10/14/22 2226  Weight: 62.1 kg    Intake/Output: I/O last 3 completed shifts: In: 75 [IV Piggyback:50] Out: -    Intake/Output this shift:  No intake/output data recorded.  Physical Exam: General: NAD  Head: Normocephalic, atraumatic.  Moist oral mucosal membranes  Eyes: Anicteric  Lungs:  Clear to auscultation, normal effort  Heart: Regular rate and rhythm  Abdomen:  Soft, nontender  Extremities:  No peripheral edema.  Neurologic: Alert and oriented, moving all four extremities  Skin: No lesions  Access: Rt chest pemcath    Basic Metabolic Panel: Recent Labs  Lab 10/13/22 1539 10/13/22 1815 10/14/22 2229 10/15/22 0431  NA 138 138 138 143  K 6.1* 4.9 5.2* 4.6  CL 98 103 98 103  CO2 23  --  17* 18*  GLUCOSE 115* 115* 72 92  BUN 48* 51* 67* 72*  CREATININE 8.63* 9.30* 9.77* 9.95*  CALCIUM 7.3*  --  6.0* 6.2*  MG 2.3  --   --   --     Liver Function Tests: Recent Labs  Lab 10/13/22 1539  AST 20  ALT 26  ALKPHOS 60  BILITOT 0.5  PROT 7.4  ALBUMIN 3.5   Recent Labs  Lab 10/13/22 1539  LIPASE 44   No results for input(s): "AMMONIA" in the last 168 hours.  CBC: Recent Labs  Lab 10/13/22 1539 10/13/22 1815 10/14/22 2229 10/15/22 0431  WBC 7.6  --  8.9 8.4  NEUTROABS 5.7  --  6.4  --   HGB 12.1* 11.6* 10.7* 9.8*  HCT 40.6 34.0* 35.8* 31.7*  MCV 92.5  --  93.0 89.8  PLT 223  --  202 232    Cardiac Enzymes: No results for input(s): "CKTOTAL", "CKMB", "CKMBINDEX", "TROPONINI" in the last 168 hours.  BNP: Invalid input(s): "POCBNP"  CBG: No results for input(s): "GLUCAP" in the last 168 hours.  Microbiology: Results for orders placed or  performed during the hospital encounter of 10/14/22  SARS Coronavirus 2 by RT PCR (hospital order, performed in [facility] hospital lab) *cepheid single result test* Anterior Nasal Swab     Status: Abnormal   Collection Time: 10/14/22 11:12 PM   Specimen: Anterior Nasal Swab  Result Value Ref Range Status   SARS Coronavirus 2 by RT PCR POSITIVE (A) NEGATIVE Final    Comment: (NOTE) SARS-CoV-2 target nucleic acids are DETECTED  SARS-CoV-2 RNA is generally detectable in upper respiratory specimens  during the acute phase of infection.  Positive  results are indicative  of the presence of the identified virus, but do not rule out bacterial infection or co-infection with other pathogens not detected by the test.  Clinical correlation with patient history and  other diagnostic information is necessary to determine patient infection status.  The expected result is negative.  Fact Sheet for Patients:   RoadLapTop.co.za   Fact Sheet for Healthcare Providers:   http://kim-miller.com/    This test is not yet approved or cleared by the [place] FDA and  has been authorized for detection and/or diagnosis of SARS-CoV-2 by FDA under an Emergency Use Authorization (EUA).  This EUA will remain in effect (meaning this test can be used) for the duration of  the COVID-19 declaration under Section 564(b)(1)  of the Act, 21 U.S.C. section 360-bbb-3(b)(1), unless the authorization is terminated or revoked sooner.   Performed at [facility], [address] [place], [place] [address]     Coagulation Studies: Recent Labs    10/15/22 0011  LABPROT 14.3  INR 1.1    Urinalysis: No results for input(s): "COLORURINE", "LABSPEC", "PHURINE", "GLUCOSEU", "HGBUR", "BILIRUBINUR", "KETONESUR", "PROTEINUR", "UROBILINOGEN", "NITRITE", "LEUKOCYTESUR" in the last 72 hours.  Invalid input(s): "APPERANCEUR"    Imaging: US Venous Img Lower Bilateral (DVT)  Result Date: 10/15/2022 CLINICAL DATA:  New diagnosis of pulmonary emboli. Assess for residual DVT. EXAM: BILATERAL LOWER EXTREMITY VENOUS DOPPLER ULTRASOUND TECHNIQUE: Gray-scale sonography with graded compression, as well as color Doppler and duplex ultrasound were performed to evaluate the lower extremity deep venous systems from the level of the common femoral vein and including the common femoral, femoral, profunda femoral, popliteal and calf veins including the posterior tibial, peroneal and gastrocnemius veins when visible. The  superficial great saphenous vein was also interrogated. Spectral Doppler was utilized to evaluate flow at rest and with distal augmentation maneuvers in the common femoral, femoral and popliteal veins. COMPARISON:  None Available. FINDINGS: RIGHT LOWER EXTREMITY Common Femoral Vein: No evidence of thrombus. Normal compressibility, respiratory phasicity and response to augmentation. Saphenofemoral Junction: No evidence of thrombus. Normal compressibility and flow on color Doppler imaging. Profunda Femoral Vein: No evidence of thrombus. Normal compressibility and flow on color Doppler imaging. Femoral Vein: No evidence of thrombus. Normal compressibility, respiratory phasicity and response to augmentation. Popliteal Vein: No evidence of thrombus. Normal compressibility, respiratory phasicity and response to augmentation. Calf Veins: No evidence of thrombus. Normal compressibility and flow on color Doppler imaging. Superficial Great Saphenous Vein: No evidence of thrombus. Normal compressibility. Venous Reflux:  None. Other Findings:  None. LEFT LOWER EXTREMITY Common Femoral Vein: No evidence of thrombus. Normal compressibility, respiratory phasicity and response to augmentation. Saphenofemoral Junction: No evidence of thrombus. Normal compressibility and flow on color Doppler imaging. Profunda Femoral Vein: No evidence of thrombus. Normal compressibility and flow on color Doppler imaging. Femoral Vein: No evidence of thrombus. Normal compressibility, respiratory phasicity and response to augmentation. Popliteal Vein: No evidence of thrombus.  Normal compressibility, respiratory phasicity and response to augmentation. Calf Veins: No evidence of thrombus. Normal compressibility and flow on color Doppler imaging. Superficial Great Saphenous Vein: No evidence of thrombus. Normal compressibility. Venous Reflux:  None. Other Findings:  None. IMPRESSION: No evidence of deep venous thrombosis in either lower extremity.  Electronically Signed   By: Malachy Moan M.D.   On: 10/15/2022 10:30   CT Angio Chest PE W and/or Wo Contrast  Result Date: 10/14/2022 CLINICAL DATA:  Shortness of breath EXAM: CT ANGIOGRAPHY CHEST WITH CONTRAST TECHNIQUE: Multidetector CT imaging of the chest was performed using the standard protocol during bolus administration of intravenous contrast. Multiplanar CT image reconstructions and MIPs were obtained to evaluate the vascular anatomy. RADIATION DOSE REDUCTION: This exam was performed according to the departmental dose-optimization program which includes automated exposure control, adjustment of the mA and/or kV according to patient size and/or use of iterative reconstruction technique. CONTRAST:  75mL OMNIPAQUE IOHEXOL 350 MG/ML SOLN COMPARISON:  Chest radiograph 10/14/2022 and CTA chest 04/19/2022 FINDINGS: Cardiovascular: Tiny nonocclusive subsegmental pulmonary embolism in a left lower lobe posterior artery (4/95). This is similar in location to the pulmonary embolism seen on CT abdomen and pelvis 08/10/2022. No right heart strain. Sternotomy and CABG. Coronary artery and aortic atherosclerotic calcification. No pericardial effusion. Mediastinum/Nodes: Trachea and esophagus are unremarkable. No thoracic adenopathy. Lungs/Pleura: Advanced emphysema. Biapical pleural-parenchymal scarring. No focal pneumonia. No pleural effusion or pneumothorax. Upper Abdomen: No acute abnormality. Cysts/hemangiomas in the liver better characterized on prior imaging. Musculoskeletal: No acute fracture. Subacute or chronic right anterior third rib fracture. Review of the MIP images confirms the above findings. IMPRESSION: 1. Tiny nonocclusive subsegmental pulmonary embolism in a left lower lobe posterior artery. This is similar to CT abdomen and pelvis 08/10/2022. No right heart strain. 2. Advanced emphysema. Aortic Atherosclerosis (ICD10-I70.0) and Emphysema (ICD10-J43.9). Critical Value/emergent results were  called by telephone at the time of interpretation on 10/14/2022 at 11:46 pm to provider [facility] , who verbally acknowledged these results. Electronically Signed   By: Minerva Fester M.D.   On: 10/14/2022 23:49   DG Chest 2 View  Result Date: 10/14/2022 CLINICAL DATA:  Shortness of breath EXAM: CHEST - 2 VIEW COMPARISON:  Chest x-ray 10/13/2022 FINDINGS: Right-sided central venous catheter tip ends in the cavoatrial junction. Sternotomy wires are present. The heart size and mediastinal contours are within normal limits. Both lungs are clear. Acute fractures are seen. There is dextroconvex curvature of the thoracic spine. IMPRESSION: No active cardiopulmonary disease. Electronically Signed   By: Darliss Cheney M.D.   On: 10/14/2022 22:53   DG Chest 2 View  Result Date: 10/13/2022 CLINICAL DATA:  Shortness of breath. EXAM: CHEST - 2 VIEW COMPARISON:  September 20, 2022. FINDINGS: The heart size and mediastinal contours are within normal limits. Both lungs are clear. Stable right-sided dialysis catheter. Status post coronary artery bypass graft. Moderate dextroscoliosis of thoracic spine. IMPRESSION: No active cardiopulmonary disease. Electronically Signed   By: Lupita Raider M.D.   On: 10/13/2022 16:14     Medications:     amLODipine  2.5 mg Oral Daily   apixaban  5 mg Oral BID   vitamin C  250 mg Oral Daily   aspirin EC  81 mg Oral Daily   calcium acetate  667 mg Oral TID WC   Chlorhexidine Gluconate Cloth  6 each Topical Q0600   guaiFENesin  600 mg Oral BID   isosorbide dinitrate  10 mg Oral TID  metoprolol succinate  25 mg Oral Daily   pantoprazole  40 mg Oral Daily   zinc sulfate  220 mg Oral Daily   acetaminophen **OR** acetaminophen, alteplase, chlorpheniramine-HYDROcodone, heparin, labetalol, magnesium hydroxide, ondansetron **OR** ondansetron (ZOFRAN) IV, traZODone  Assessment/ Plan:  Anthony Hess is a 67 y.o.  male with past medical conditions including hypertension, PVD,  dyslipidemia, CAD, four-vessel CABG, and end-stage renal disease on hemodialysis. Patient presents to the emergency department with complaints of shortness of breath. Paietn has been admitted for Hypocalcemia [E83.51] Shortness of breath [R06.02] Dyspnea [R06.00] Pulmonary embolism, other, unspecified chronicity, unspecified whether acute cor pulmonale present (HCC) [I26.99] COVID-19 [U07.1]  [facility]/TTS/ Rt permcath  End stage renal disease on hemodialysis.  Last treatment received on Tuesday. Will receive treatment later today.   2. Anemia of chronic kidney disease Lab Results  Component Value Date   HGB 9.8 (L) 10/15/2022    Hgb within optimal range  3. Secondary Hyperparathyroidism:  Lab Results  Component Value Date   PTH 170 (H) 06/25/2022   CALCIUM 6.2 (LL) 10/15/2022   CAION 0.82 (LL) 10/13/2022   PHOS 3.4 09/17/2022   Found to be hypocalcemic, primary team has order calcium gluconate 1g IV.  Prescribed calcitriol and calcium acetate with meals outpatient.   4. Hypertension with chronic kidney disease. Home regimen includes amlodipine, hydralazine, isosorbide, and metoprolol.           Hydralazine held   LOS: 0 Anthony Hess 8/23/20242:05 PM

## 2022-10-15 NOTE — Discharge Planning (Signed)
ESTABLISHED HEMODIALYSIS  Outpatient Facility [facility] [address] [place], [place] [address] [phone]  Schedule: Tuesday Thursday and Saturday @ 6:00am  Dimas Chyle Dialysis Coordinator II  [facility] Cell: [phone] eFax: [phone] [email or web address]

## 2022-10-15 NOTE — Discharge Summary (Signed)
Physician Discharge Summary   Patient: Anthony Hess MRN: [id number] DOB: 10/28/1955  Admit date:     10/14/2022  Discharge date: 10/15/22  Discharge Physician: Arnetha Courser   PCP: Ronnald Ramp, MD   Recommendations at discharge:  Please obtain CBC and BMP on follow-up Continue with scheduled dialysis Follow-up with primary care provider  Discharge Diagnoses: Principal Problem:   Dyspnea Active Problems:   Hypertensive urgency   Acute pulmonary embolism (HCC)   End-stage renal disease on hemodialysis (HCC)   COVID-19 virus infection   GERD without esophagitis   Hospital Course: Taken from H&P  Anthony Hess is a 67 y.o. male with medical history significant for end-stage renal disease on hemodialysis on TTS, missed Thursday and Tuesday as he could not get a ride per his report, HFrEF, dilated cardiomyopathy, bilateral carotid artery disease, dyslipidemia, hypertension and coronary artery disease status post CABG, who presented to the ER with acute onset of dyspnea that started today.  No chest pain, orthopnea or PND.  On arrival to ER blood pressure was elevated 167/122, otherwise stable.  COVID-19 PCR came back positive.  Labs with potassium of 5.2, calcium 6, BUN 67 and creatinine 9.77.  Troponin 118>>127 EKG likely ectopic rhythm, IVCD and T wave inversions laterally. Chest x-ray without any acute abnormality. CTA with a small nonocclusive subsegmental PE in the left lower lobe posterior artery.  This is similar to CT abdomen and pelvis done in June 2024.  No right heart strain.  Advanced emphysema and aortic atherosclerosis was noted. Not sure why home Eliquis was stopped and patient was started on heparin infusion.  Also received IV calcium gluconate.  8/23: Stop heparin and restart home Eliquis.  Patient is hemodynamically stable, no oxygen requirement and chest x-ray without any acute abnormality.  Dyspnea likely secondary to missed dialysis.  Mildly elevated  troponin with left flat curve likely secondary to demand.  No chest pain. Echo done in July 2024 was normal EF and grade 1 diastolic dysfunction.  Patient was started on some supplement to boost of his immunity.  Otherwise at baseline.  Apparently broke up with his wife couple of days ago and currently homeless.  TOC provided the resources, likely be going to a motel until sort out his living situation.  Patient received dialysis and need to continue with his routine dialysis as outpatient.  He will continue with rest of his home medications and follow-up with his providers for further recommendations.   Assessment and Plan: * Dyspnea - This could be related to his pulm embolism as well as slight fluid overload though his chest CTA and chest x-ray showed no pulmonary edema. - It could be partly related to COVID-19 though he is not hypoxic with it. - He will be admitted to a progressive unit bed. - We will monitor his pulse oximetry. - Management otherwise as below.  Acute pulmonary embolism (HCC) - While this was shown on a previous CTA and is nonocclusive, and should have been resolved by now being on Eliquis raising the concern for recurrent PE?? Patient remained asymptomatic and on room air. -Discontinue heparin infusion and restarting home Eliquis -Outpatient follow-up with PCP  Hypertensive urgency - We will continue his antihypertensives. - He will be placed on as needed IV labetalol and hydralazine.  End-stage renal disease on hemodialysis [facility]) - She is on hemodialysis on TTS. -Patient received dialysis and will continue with his routine as outpatient  COVID-19 virus infection - I believe that this was an accidental  finding. - The patient has no CT findings of COVID-pneumonia. - He has no current hypoxia. - Conservative management will be pursued. - We will place him on mucolytic therapy and monitor pulse oximetry. - He will be placed on isolation,. - Will add vitamin C  and zinc sulfate.  GERD without esophagitis - We will continue PPI therapy.   Consultants: Nephrology Procedures performed: Hemodialysis Disposition: Home Diet recommendation:  Discharge Diet Orders (From admission, onward)     Start     Ordered   10/15/22 0000  Diet - low sodium heart healthy        10/15/22 1547           Cardiac diet DISCHARGE MEDICATION: Allergies as of 10/15/2022   No Known Allergies      Medication List     STOP taking these medications    calcitRIOL 0.5 MCG capsule Commonly known as: ROCALTROL       TAKE these medications    amLODipine 2.5 MG tablet Commonly known as: NORVASC Take 1 tablet (2.5 mg total) by mouth daily.   apixaban 5 MG Tabs tablet Commonly known as: ELIQUIS Take 1 tablet (5 mg total) by mouth 2 (two) times daily.   ascorbic acid 250 MG tablet Commonly known as: VITAMIN C Take 2 tablets (500 mg total) by mouth daily. Start taking on: October 16, 2022   aspirin EC 81 MG tablet Take 81 mg by mouth daily.   benzonatate 100 MG capsule Commonly known as: Tessalon Perles Take 1 capsule (100 mg total) by mouth 3 (three) times daily as needed for cough.   calcium acetate 667 MG capsule Commonly known as: PHOSLO Take 1 capsule (667 mg total) by mouth 3 (three) times daily with meals.   guaiFENesin 600 MG 12 hr tablet Commonly known as: MUCINEX Take 1 tablet (600 mg total) by mouth 2 (two) times daily.   isosorbide dinitrate 10 MG tablet Commonly known as: ISORDIL Take 1 tablet (10 mg total) by mouth 3 (three) times daily.   metoprolol succinate 25 MG 24 hr tablet Commonly known as: TOPROL-XL Take 25 mg by mouth daily.   ondansetron 4 MG disintegrating tablet Commonly known as: ZOFRAN-ODT Take 1 tablet (4 mg total) by mouth every 8 (eight) hours as needed for nausea or vomiting.   pantoprazole 40 MG tablet Commonly known as: Protonix Take 1 tablet (40 mg total) by mouth daily.   zinc sulfate 220 (50 Zn)  MG capsule Take 1 capsule (220 mg total) by mouth daily. Start taking on: October 16, 2022        Follow-up Information     Simmons-Robinson, [name], MD. Schedule an appointment as soon as possible for a visit in 1 week(s).   Specialty: Family Medicine Contact information: [address] [place] [address] [phone]                Discharge Exam: [name] Weights   10/14/22 2226 10/15/22 1510  Weight: 62.1 kg 52.6 kg   General.  Malnourished gentleman, in no acute distress. Pulmonary.  Lungs clear bilaterally, normal respiratory effort. CV.  Regular rate and rhythm, no JVD, rub or murmur. Abdomen.  Soft, nontender, nondistended, BS positive. CNS.  Alert and oriented .  No focal neurologic deficit. Extremities.  No edema, no cyanosis, pulses intact and symmetrical. Psychiatry.  Judgment and insight appears normal.   Condition at discharge: stable  The results of significant diagnostics from this hospitalization (including imaging, microbiology, ancillary and  laboratory) are listed below for reference.   Imaging Studies: US Venous Img Lower Bilateral (DVT)  Result Date: 10/15/2022 CLINICAL DATA:  New diagnosis of pulmonary emboli. Assess for residual DVT. EXAM: BILATERAL LOWER EXTREMITY VENOUS DOPPLER ULTRASOUND TECHNIQUE: Gray-scale sonography with graded compression, as well as color Doppler and duplex ultrasound were performed to evaluate the lower extremity deep venous systems from the level of the common femoral vein and including the common femoral, femoral, profunda femoral, popliteal and calf veins including the posterior tibial, peroneal and gastrocnemius veins when visible. The superficial great saphenous vein was also interrogated. Spectral Doppler was utilized to evaluate flow at rest and with distal augmentation maneuvers in the common femoral, femoral and popliteal veins. COMPARISON:  None Available. FINDINGS: RIGHT LOWER EXTREMITY Common  Femoral Vein: No evidence of thrombus. Normal compressibility, respiratory phasicity and response to augmentation. Saphenofemoral Junction: No evidence of thrombus. Normal compressibility and flow on color Doppler imaging. Profunda Femoral Vein: No evidence of thrombus. Normal compressibility and flow on color Doppler imaging. Femoral Vein: No evidence of thrombus. Normal compressibility, respiratory phasicity and response to augmentation. Popliteal Vein: No evidence of thrombus. Normal compressibility, respiratory phasicity and response to augmentation. Calf Veins: No evidence of thrombus. Normal compressibility and flow on color Doppler imaging. Superficial Great Saphenous Vein: No evidence of thrombus. Normal compressibility. Venous Reflux:  None. Other Findings:  None. LEFT LOWER EXTREMITY Common Femoral Vein: No evidence of thrombus. Normal compressibility, respiratory phasicity and response to augmentation. Saphenofemoral Junction: No evidence of thrombus. Normal compressibility and flow on color Doppler imaging. Profunda Femoral Vein: No evidence of thrombus. Normal compressibility and flow on color Doppler imaging. Femoral Vein: No evidence of thrombus. Normal compressibility, respiratory phasicity and response to augmentation. Popliteal Vein: No evidence of thrombus. Normal compressibility, respiratory phasicity and response to augmentation. Calf Veins: No evidence of thrombus. Normal compressibility and flow on color Doppler imaging. Superficial Great Saphenous Vein: No evidence of thrombus. Normal compressibility. Venous Reflux:  None. Other Findings:  None. IMPRESSION: No evidence of deep venous thrombosis in either lower extremity. Electronically Signed   By: Malachy Moan M.D.   On: 10/15/2022 10:30   CT Angio Chest PE W and/or Wo Contrast  Result Date: 10/14/2022 CLINICAL DATA:  Shortness of breath EXAM: CT ANGIOGRAPHY CHEST WITH CONTRAST TECHNIQUE: Multidetector CT imaging of the chest was  performed using the standard protocol during bolus administration of intravenous contrast. Multiplanar CT image reconstructions and MIPs were obtained to evaluate the vascular anatomy. RADIATION DOSE REDUCTION: This exam was performed according to the departmental dose-optimization program which includes automated exposure control, adjustment of the mA and/or kV according to patient size and/or use of iterative reconstruction technique. CONTRAST:  75mL OMNIPAQUE IOHEXOL 350 MG/ML SOLN COMPARISON:  Chest radiograph 10/14/2022 and CTA chest 04/19/2022 FINDINGS: Cardiovascular: Tiny nonocclusive subsegmental pulmonary embolism in a left lower lobe posterior artery (4/95). This is similar in location to the pulmonary embolism seen on CT abdomen and pelvis 08/10/2022. No right heart strain. Sternotomy and CABG. Coronary artery and aortic atherosclerotic calcification. No pericardial effusion. Mediastinum/Nodes: Trachea and esophagus are unremarkable. No thoracic adenopathy. Lungs/Pleura: Advanced emphysema. Biapical pleural-parenchymal scarring. No focal pneumonia. No pleural effusion or pneumothorax. Upper Abdomen: No acute abnormality. Cysts/hemangiomas in the liver better characterized on prior imaging. Musculoskeletal: No acute fracture. Subacute or chronic right anterior third rib fracture. Review of the MIP images confirms the above findings. IMPRESSION: 1. Tiny nonocclusive subsegmental pulmonary embolism in a left lower lobe posterior artery. This is similar  to CT abdomen and pelvis 08/10/2022. No right heart strain. 2. Advanced emphysema. Aortic Atherosclerosis (ICD10-I70.0) and Emphysema (ICD10-J43.9). Critical Value/emergent results were called by telephone at the time of interpretation on 10/14/2022 at 11:46 pm to provider [facility] , who verbally acknowledged these results. Electronically Signed   By: Minerva Fester M.D.   On: 10/14/2022 23:49   DG Chest 2 View  Result Date: 10/14/2022 CLINICAL  DATA:  Shortness of breath EXAM: CHEST - 2 VIEW COMPARISON:  Chest x-ray 10/13/2022 FINDINGS: Right-sided central venous catheter tip ends in the cavoatrial junction. Sternotomy wires are present. The heart size and mediastinal contours are within normal limits. Both lungs are clear. Acute fractures are seen. There is dextroconvex curvature of the thoracic spine. IMPRESSION: No active cardiopulmonary disease. Electronically Signed   By: Darliss Cheney M.D.   On: 10/14/2022 22:53   DG Chest 2 View  Result Date: 10/13/2022 CLINICAL DATA:  Shortness of breath. EXAM: CHEST - 2 VIEW COMPARISON:  September 20, 2022. FINDINGS: The heart size and mediastinal contours are within normal limits. Both lungs are clear. Stable right-sided dialysis catheter. Status post coronary artery bypass graft. Moderate dextroscoliosis of thoracic spine. IMPRESSION: No active cardiopulmonary disease. Electronically Signed   By: Lupita Raider M.D.   On: 10/13/2022 16:14   DG Chest 2 View  Result Date: 09/20/2022 CLINICAL DATA:  Shortness of breath and midline abdominal pain EXAM: CHEST - 2 VIEW COMPARISON:  Radiographs 09/17/2022 FINDINGS: Stable cardiomediastinal silhouette. Aortic atherosclerotic calcification. Sternotomy. Right IJ CVC tip in the right atrium. No focal consolidation, pleural effusion, or pneumothorax. No displaced rib fractures. Right convex thoracic curve. IMPRESSION: No active cardiopulmonary disease. Electronically Signed   By: Minerva Fester M.D.   On: 09/20/2022 01:16   DG Chest Port 1 View  Result Date: 09/17/2022 CLINICAL DATA:  Shortness of breath EXAM: PORTABLE CHEST 1 VIEW COMPARISON:  09/15/2022 FINDINGS: Prior CABG. Right Ellis catheter remains in place, unchanged. Mild cardiomegaly, vascular congestion. No confluent opacities, effusions or overt edema. No acute bony abnormality. IMPRESSION: Mild cardiomegaly, vascular congestion. Electronically Signed   By: Charlett Nose M.D.   On: 09/17/2022 03:32    DG Chest Port 1 View  Result Date: 09/15/2022 CLINICAL DATA:  Shortness of breath EXAM: PORTABLE CHEST 1 VIEW COMPARISON:  Chest x-ray 09/14/2022 FINDINGS: Right-sided central venous catheter tip projects over the distal SVC. Sternotomy wires are again noted. The cardiomediastinal silhouette appears unchanged and within normal limits. There is no focal lung infiltrate, pleural effusion or pneumothorax. There is mild curvature of the thoracic spine, similar to prior. IMPRESSION: No active disease. Electronically Signed   By: Darliss Cheney M.D.   On: 09/15/2022 23:44    Microbiology: Results for orders placed or performed during the hospital encounter of 10/14/22  SARS Coronavirus 2 by RT PCR (hospital order, performed in [facility] hospital lab) *cepheid single result test* Anterior Nasal Swab     Status: Abnormal   Collection Time: 10/14/22 11:12 PM   Specimen: Anterior Nasal Swab  Result Value Ref Range Status   SARS Coronavirus 2 by RT PCR POSITIVE (A) NEGATIVE Final    Comment: (NOTE) SARS-CoV-2 target nucleic acids are DETECTED  SARS-CoV-2 RNA is generally detectable in upper respiratory specimens  during the acute phase of infection.  Positive results are indicative  of the presence of the identified virus, but do not rule out bacterial infection or co-infection with other pathogens not detected by the test.  Clinical correlation with  patient history and  other diagnostic information is necessary to determine patient infection status.  The expected result is negative.  Fact Sheet for Patients:   RoadLapTop.co.za   Fact Sheet for Healthcare Providers:   http://kim-miller.com/    This test is not yet approved or cleared by the [place] FDA and  has been authorized for detection and/or diagnosis of SARS-CoV-2 by FDA under an Emergency Use Authorization (EUA).  This EUA will remain in effect (meaning this test can be used) for the  duration of  the COVID-19 declaration under Section 564(b)(1)  of the Act, 21 U.S.C. section 360-bbb-3(b)(1), unless the authorization is terminated or revoked sooner.   Performed at [facility], [address] [place], [place] [address]     Labs: CBC: Recent Labs  Lab 10/13/22 1539 10/13/22 1815 10/14/22 2229 10/15/22 0431  WBC 7.6  --  8.9 8.4  NEUTROABS 5.7  --  6.4  --   HGB 12.1* 11.6* 10.7* 9.8*  HCT 40.6 34.0* 35.8* 31.7*  MCV 92.5  --  93.0 89.8  PLT 223  --  202 232   Basic Metabolic Panel: Recent Labs  Lab 10/13/22 1539 10/13/22 1815 10/14/22 2229 10/15/22 0431  NA 138 138 138 143  K 6.1* 4.9 5.2* 4.6  CL 98 103 98 103  CO2 23  --  17* 18*  GLUCOSE 115* 115* 72 92  BUN 48* 51* 67* 72*  CREATININE 8.63* 9.30* 9.77* 9.95*  CALCIUM 7.3*  --  6.0* 6.2*  MG 2.3  --   --   --    Liver Function Tests: Recent Labs  Lab 10/13/22 1539  AST 20  ALT 26  ALKPHOS 60  BILITOT 0.5  PROT 7.4  ALBUMIN 3.5   CBG: No results for input(s): "GLUCAP" in the last 168 hours.  Discharge time spent: greater than 30 minutes.  This record has been created using Conservation officer, historic buildings. Errors have been sought and corrected,but may not always be located. Such creation errors do not reflect on the standard of care.   Signed: Arnetha Courser, MD [facility] 10/15/2022

## 2022-10-15 NOTE — Hospital Course (Addendum)
Taken from H&P  Anthony Hess is a 67 y.o. male with medical history significant for end-stage renal disease on hemodialysis on TTS, missed Thursday and Tuesday as he could not get a ride per his report, HFrEF, dilated cardiomyopathy, bilateral carotid artery disease, dyslipidemia, hypertension and coronary artery disease status post CABG, who presented to the ER with acute onset of dyspnea that started today.  No chest pain, orthopnea or PND.  On arrival to ER blood pressure was elevated 167/122, otherwise stable.  COVID-19 PCR came back positive.  Labs with potassium of 5.2, calcium 6, BUN 67 and creatinine 9.77.  Troponin 118>>127 EKG likely ectopic rhythm, IVCD and T wave inversions laterally. Chest x-ray without any acute abnormality. CTA with a small nonocclusive subsegmental PE in the left lower lobe posterior artery.  This is similar to CT abdomen and pelvis done in June 2024.  No right heart strain.  Advanced emphysema and aortic atherosclerosis was noted. Not sure why home Eliquis was stopped and patient was started on heparin infusion.  Also received IV calcium gluconate.  8/23: Stop heparin and restart home Eliquis.  Patient is hemodynamically stable, no oxygen requirement and chest x-ray without any acute abnormality.  Dyspnea likely secondary to missed dialysis.  Mildly elevated troponin with left flat curve likely secondary to demand.  No chest pain. Echo done in July 2024 was normal EF and grade 1 diastolic dysfunction.  Patient was started on some supplement to boost of his immunity.  Otherwise at baseline.  Apparently broke up with his wife couple of days ago and currently homeless.  TOC provided the resources, likely be going to a motel until sort out his living situation.  Patient received dialysis and need to continue with his routine dialysis as outpatient.  He will continue with rest of his home medications and follow-up with his providers for further recommendations.  8/24:  Patient with recent separation from wife, supposed to leave yesterday after dialysis but did not left stating that he does not have any money or place to go.  He was provided with resources initially admitted with [facility] that he has money for a hotel for temporary stay, then keeps saying that he does not have any money or any place to go.  Likely will leave after dialysis today, getting his routine dialysis today.  Otherwise medically stable

## 2022-10-15 NOTE — Discharge Instructions (Signed)
Hotels: Anthony Hess [address] [place], [place] [address] [phone] 3-day special: $140 (weekdays), $150 (weekends). Weekly special: [address], [place], [place] [address] [phone] Weekly: $337.96 for one person + $25 pet fee if applicable  [employer] [address], [place], [place] [address] [phone] Weekly rate for queen-sized bed: $368    Rent/Utility/Housing  Agency Name: [facility] Agency Address: [address] [place], [place] [address] Phone: [phone] Email: [email or web address] Website: www.[email or web address].org Service(s) Offered: Housing services, self-sufficiency, congregate meal program, weatherization program, Field seismologist program, emergency food assistance,  housing counseling, home ownership program, wheels -towork program.  Agency Name: [facility] Address: [address] [place], [place] [address] Phone: [phone] (8a-4p) [phone] (8p- 10p) Email: [email or web address] Website: [email or web address] Service(s) Offered: A program for homeless and/or needy men that includes one-on-one counseling, life skills training and job rehabilitation.  Agency Name: [facility] Address: [address] [place], [place] [address] Phone: [phone] Website: www.alliedchurches.org Service(s) Offered: Assistance to needy in emergency with utility bills, heating fuel, and prescriptions. Shelter for homeless 7pm-7am. June 17, 2016 15  Agency Name: [facility] [place] (Developmentally Disabled) Address: [address], [place], [place] [address] Phone: [phone] Contact Person: Anthony Hess Email: [email or web address] Website: [email or web address] Service(s) Offered: Helps individuals with developmental disabilities move from housing that is more restrictive to homes where they  can achieve greater independence and have more   opportunities.  Agency Name: [facility] Address: [address], [place], [place] [address] Phone: [phone] Email: [email or web address] Website: www.[email or web address].org Service(s) Offered: Provides affordable housing for low-income families, elderly, and disabled individuals. Offer a wide range of  programs and services, from financial planning to afterschool and summer programs.  Agency Name: Department of Social Services Address: [address] [place], [place] [address] Phone: [phone] Service(s) Offered: Child support services; child welfare services; food stamps; Medicaid; work first family assistance; and aid with fuel,  rent, food and medicine.  Agency Name: [facility] [place] [employer] Address: [facility] [address], [place], [place]  [address] Phone: [phone] Website: www.familyabuseservices.org Service(s) Offered: 24 hour Crisis Line: [phone]; 24 hour Emergency Shelter; Transitional Housing; Support Groups; Scientist, physiological; [employer]; Hispanic Outreach: [phone];  Visitation Center: [phone].  Agency Name: [facility] [place]. Address: [address], [place], [place] [address] Phone: [phone] Service(s) Offered: CAP Services; Home and [employer]; Individual or Group Supports; Respite Care Non-Institutional Nursing;  Residential Supports; Respite Care and Personal Care Services; Transportation; Family and Friends Night; Recreational Activities; Three Nutritious Meals/Snacks; Consultation with Registered Dietician; Twenty-four hour Registered Nurse Access; Daily and [employer]; Camp Green Leaves; [place] for the [employer] (During Summer Months) Bingo Night (Every  Wednesday Night); Special Populations Dance Night  (Every Tuesday Night); Professional Hair Care Services.  Agency Name: [facility] Address: [address], [place],  [place] [address] Phone: [phone] Contact Person: Anthony Hess Website: [email or web address].Physicist, medical) Offered: Residential treatment facility for women; food and  clothing, educational & employment development and  transportation to work; Counsellor of financial skills;  parenting and family reunification; emotional and spiritual  support; transitional housing for program graduates.  Agency Name: [facility] Address: [address], [place], [place] [address] Phone: [phone] Email: [email or web address] Website: [email or web address].es Service(s) Offered: Public housing units for elderly, disabled, and low income people; housing choice vouchers for income eligible  applicants; shelter plus care vouchers; and [employer] program.  Agency Name: [facility] Address: [address], [place], [place] [address] Phone: [phone] Email: [email or web address] Website: www.habitatalamance.org Service(s) Offered: Build houses for families in need of decent housing. Each adult in the family must invest 200 hours of labor on  someone else's house, work with volunteers to build their own house, attend classes on budgeting, home maintenance, yard care, and attend homeowner association meetings.  Agency Name: [facility] Address: [address], [place], [place] [address] Phone: [phone] Website: www.rsli.org Service(s) Offered: Intermediate care facilities for intellectually delayed, Supervised Living in group homes for adults with developmental disabilities, Supervised Living for people who have dual diagnoses (MRMI), Independent Living, Supported Living, respite and a variety of CAP services, pre-vocational services, day supports, and [employer].  Agency Name: [place] [employer] [facility] Phone: [phone] Website: www.NCForeclosurePrevention.gov Service(s) Offered:  Zero-interest, deferred loans to homeowners struggling to pay their mortgage. Call for more information.   Food Resources  Agency Name: [facility] Agency Address: [address] [place], [place] [address] Phone: [phone] Website: www.alamanceservices.org Service(s) Offered: Housing services, self-sufficiency, congregate meal program, weatherization program, Event organiser program, emergency food assistance,  housing counseling, home ownership program, wheels - to work program.  Dole Food free for 60 and older at various locations from [employer], Monday-Friday:  [employer], [address] [place], [phone] -[facility], [address] [place] [phone]  -[facility], [address] [place] [phone]  -[address], [address] [place], [phone]  Agency Name: [facility] Address: [phone] [address], [place], [place] [address] Phone: [phone] Website: www.alamancemow.org Service(s) Offered: Home delivered hot, frozen, and emergency  meals. Grocery assistance program which matches  volunteers one-on-one with seniors unable to grocery shop  for themselves. Must be 60 years and older; less than 20  hours of in-home aide service, limited or no driving ability;  live alone or with someone with a disability; live in  [place].  Agency Name: [facility]) Address: [address], [place], [place] [address] Phone: [phone] Service(s) Offered: Food is served to shut-ins, homeless, elderly, and low income people in the community every Saturday (11:30 am-12:30 pm) and Sunday (12:30 pm-1:30pm). Volunteers also offer help and encouragement in seeking employment,  and spiritual guidance.  Agency Name: Department of Social Services Address: [address] [place], [place] [address] Phone: [phone] Service(s)  Offered: Child support services; child welfare services; food stamps; Medicaid; work first family assistance; and aid with fuel,  rent, food and medicine.  Agency Name: [facility] Address: [address] [place], [place] Phone: [phone] Website: www.dreamalign.com Services Offered: Monday 10:00am-12:00, 8:00pm-9:00pm, and Friday 10:00am-12:00.  Agency Name: [facility] Address: [address] [place], [place] [address] Phone: [phone] Website: www.alliedchurches.org Service(s) Offered: Serves weekday meals, open from 11:30 am- 1:00 pm., and 6:30-7:30pm, Monday-Wednesday-Friday distributes food 3:30-6pm, Monday-Wednesday-Friday.  Agency Name: [facility] Address: [address], [place], [place] Phone: [phone] Website: www.[email or web address].org Services Offered: Distributes food the 4th Saturday of the month, starting at 8:00 am  Agency Name: [facility] Address: [phone] [address], [place], [place] [address] Phone: [phone] Website: [email or web address] Service(s) Offered: Bread of life, weekly food pantry. Open Wednesdays from 10:00am-noon.  Agency Name: [facility] Address: [address] [place], [place], [place] Phone: [phone] Services Offered: Distributes food 9am-1pm, Monday-Thursday. Call for details.  Agency Name: [facility]  [employer] [employer] Address: [address] [place], [place] [address] Phone: [phone] Website: firstbaptistburlington.[email or web address] Service(s) Offered: Games developer. Call for assistance.  Agency Name: [facility] Address: [address] [place], [place] [address] Phone: [phone] Service Offered: Emergency Food Pantry. Call for appointment.  Agency Name: [facility] Address: [address], [place], [place] [address] Phone: [phone] Website: [email or web address] Services Offered:  Games developer. Call for details  Agency Name: [facility] Address: [address] [place], [place] Phone: [phone] Website: [email or web address] Service(s) Offered: Emergency Food Pantry. Call for details.  Agency Name: Holiday representative Address: [address] [place], [place] [address] Phone: [phone] or [phone] Website: [email or web address] Service(s) Offered: Distribute food 9am-11:30 am, Tuesday-Friday, and 1-3:30pm, Monday-Friday. Food pantry Monday-Friday 1pm-3pm, fresh items, Mon.-Wed.-Fri.  Agency Name: [facility] Address: [address] [place], [place] [address] Phone: [phone] Website: www.safealamance.org Services Offered: Distribute food Tues and Sats from 9:00am-noon. Closed 1st Saturday of each month. Call for details

## 2022-10-15 NOTE — TOC Initial Note (Addendum)
Transition of Care [facility]) - Initial/Assessment Note    Patient Details  Name: Anthony Hess MRN: [id number] Date of Birth: 10/15/1955  Transition of [facility]) CM/SW Contact:    Darolyn Rua, LCSW Phone Number: 10/15/2022, 9:36 AM  Clinical Narrative:                  Update 3:34 pm: Patient has received list of hotels to contact if he needs a place to stay as patient has reported having funds to do so, and will secure uber vs cab himself at dc. Patient provided code 30 over the phone while patient was in HD, no additional needs noted per patient. CSW has also added hotel list, housing resources, and food resources to patient's discharge instructions for him to reach out.      Per chart review for readmission risk assessment, patient lives with wife and she provides transportation. Does not use any DME nor HH sevices. [facility]acy at [facility] and PCP is Dr. [name].  Patient has previously been provided housing and transportation resources on past admissions.   TOC will follow for needs.   Update 1:54 pm: patient on isolation, per MD will dc after HD - patient has covid - CSW brought list of hotels to hard chart, informed RN to please provide when they go in next for patient care.  CSW notes extensive hx of resources provided to patient, patient continues to be noncompliant with medical follow up and continue substance use.  Per HD coordinator patient is able to afford a hotel and requested list be given to patient, provided  Expected Discharge Plan: Home/Self Care Barriers to Discharge: Continued Medical Work up   Patient Goals and CMS Choice Patient states their goals for this hospitalization and ongoing recovery are:: to go home CMS Medicare.gov Compare Post Acute Care list provided to:: Patient Choice offered to / list presented to : Patient      Expected Discharge Plan and Services       Living arrangements for the past 2 months: Single Family Home                                       Prior Living Arrangements/Services Living arrangements for the past 2 months: Single Family Home Lives with:: Self                   Activities of Daily Living Home Assistive Devices/Equipment: None ADL Screening (condition at time of admission) Patient's cognitive ability adequate to safely complete daily activities?: Yes Is the patient deaf or have difficulty hearing?: No Does the patient have difficulty seeing, even when wearing glasses/contacts?: No Does the patient have difficulty concentrating, remembering, or making decisions?: No Patient able to express need for assistance with ADLs?: Yes Does the patient have difficulty dressing or bathing?: No Independently performs ADLs?: Yes (appropriate for developmental age) Does the patient have difficulty walking or climbing stairs?: No Weakness of Legs: None Weakness of Arms/Hands: None  Permission Sought/Granted                  Emotional Assessment              Admission diagnosis:  Hypocalcemia [E83.51] Shortness of breath [R06.02] Dyspnea [R06.00] Pulmonary embolism, other, unspecified chronicity, unspecified whether acute cor pulmonale present (HCC) [I26.99] COVID-19 [U07.1] Patient Active Problem List   Diagnosis Date Noted   Dyspnea 10/15/2022  GERD without esophagitis 10/15/2022   ESRD on hemodialysis (HCC) 09/17/2022   Acute on chronic diastolic CHF (congestive heart failure) (HCC) 09/17/2022   History of pulmonary embolism 09/17/2022   Coronary artery disease 09/17/2022   Essential hypertension 09/17/2022   Acute respiratory failure with hypoxia (HCC) 09/06/2022   Fluid overload 09/06/2022   Hypertensive emergency 09/06/2022   HLD (hyperlipidemia) 09/06/2022   CAD (coronary artery disease) 09/06/2022   Pulmonary embolism (HCC) 09/06/2022   NSTEMI (non-ST elevated myocardial infarction) (HCC) 08/17/2022   (HFpEF) heart failure with preserved ejection fraction  (HCC) 08/17/2022   Seasonal allergic rhinitis due to pollen 06/25/2022   Nausea 06/25/2022   Hoarseness of voice 06/25/2022   S/P exploratory laparotomy for perforated duodenum with admission 04/26/22 05/04/2022   Postprocedural intraabdominal abscess 05/04/2022   Acute pulmonary embolism (HCC) 04/19/2022   Dyslipidemia 04/19/2022   End-stage renal disease on hemodialysis (HCC) 04/19/2022   Tobacco dependence 04/19/2022   Chronic systolic heart failure (HCC) 04/05/2022   Hypertensive urgency 11/16/2021   Hyperkalemia 11/16/2021   Chronic back pain 11/16/2021   Hypertension secondary to other renal disorders 10/15/2021   Vitamin B12 deficiency anemia due to intrinsic factor deficiency 06/23/2021   Abdominal pain    SBP (spontaneous bacterial peritonitis) (HCC) 04/08/2021   COVID-19 virus infection 04/08/2021   AAA (abdominal aortic aneurysm) (HCC) 03/18/2021   Hypocalcemia    Leukocytosis 03/05/2021   Spontaneous bacterial peritonitis (HCC) 03/04/2021   Incisional hernia, without obstruction or gangrene    Generalized (acute) peritonitis (HCC) 12/10/2020   Iron deficiency anemia, unspecified 05/06/2020   Moderate protein-calorie malnutrition (HCC) 05/06/2020   Other long term (current) drug therapy 05/06/2020   Unspecified jaundice 05/06/2020   PD catheter dysfunction (HCC) 04/13/2020   Bilateral carotid artery stenosis 02/29/2020   Coronary artery disease involving coronary bypass graft of native heart 02/29/2020   Secondary hyperparathyroidism of renal origin (HCC) 01/30/2020   Acute peritonitis (HCC) 01/08/2020   Hyperlipidemia, mixed 01/04/2020   Mass of left side of neck 01/04/2020   Senile purpura (HCC) 01/04/2020   Hydroureteronephrosis    Atrial fibrillation, chronic (HCC) 01/05/2019   End stage renal disease (HCC) 01/05/2019   Anemia in chronic kidney disease (CODE) 01/05/2019   Presence of aortocoronary bypass graft 12/27/2018   Other emphysema (HCC) 04/14/2018    Bilateral hydronephrosis 04/03/2018   Hypertension 03/27/2018   Cigarette smoker 03/20/2018   History of bladder cancer 03/20/2018   PCP:  Ronnald Ramp, MD Pharmacy:   [facility] [address]), [place] - [address] [place]) [place] [address] Phone: [phone] Fax: [phone]     Social Determinants of Health (SDOH) Social History: SDOH Screenings   Food Insecurity: Food Insecurity Present (10/15/2022)  Housing: High Risk (10/15/2022)  Transportation Needs: Unmet Transportation Needs (10/15/2022)  Utilities: At Risk (10/15/2022)  Depression (PHQ2-9): Low Risk  (08/23/2022)  Financial Resource Strain: Low Risk  (08/23/2022)  Physical Activity: Inactive (08/23/2022)  Social Connections: Moderately Integrated (08/23/2022)  Stress: No Stress Concern Present (08/23/2022)  Tobacco Use: High Risk (10/15/2022)   SDOH Interventions:     Readmission Risk Interventions    09/17/2022   10:26 AM 09/15/2022    9:08 AM 04/28/2022    2:26 PM  Readmission Risk Prevention Plan  Transportation Screening Complete Complete Complete  Medication Review Oceanographer) Complete Complete Complete  PCP or Specialist appointment within 3-5 days of discharge Complete Complete Complete  HRI or Home Care Consult Complete Complete  Complete  SW Recovery Care/Counseling Consult Complete Complete Complete  Palliative Care Screening Not Applicable Not Applicable Not Applicable  Skilled Nursing Facility Complete Not Applicable Not Applicable

## 2022-10-15 NOTE — Assessment & Plan Note (Signed)
-   This could be related to his pulm embolism as well as slight fluid overload though his chest CTA and chest x-ray showed no pulmonary edema. - It could be partly related to COVID-19 though he is not hypoxic with it. - He will be admitted to a progressive unit bed. - We will monitor his pulse oximetry. - Management otherwise as below.

## 2022-10-16 DIAGNOSIS — R0602 Shortness of breath: Secondary | ICD-10-CM | POA: Diagnosis not present

## 2022-10-16 DIAGNOSIS — U071 COVID-19: Secondary | ICD-10-CM | POA: Diagnosis not present

## 2022-10-16 DIAGNOSIS — R0600 Dyspnea, unspecified: Secondary | ICD-10-CM | POA: Diagnosis not present

## 2022-10-16 DIAGNOSIS — N186 End stage renal disease: Secondary | ICD-10-CM | POA: Diagnosis not present

## 2022-10-16 DIAGNOSIS — Z992 Dependence on renal dialysis: Secondary | ICD-10-CM | POA: Diagnosis not present

## 2022-10-16 LAB — RENAL FUNCTION PANEL
Albumin: 3.2 g/dL — ABNORMAL LOW (ref 3.5–5.0)
Anion gap: 17 — ABNORMAL HIGH (ref 5–15)
BUN: 63 mg/dL — ABNORMAL HIGH (ref 8–23)
CO2: 22 mmol/L (ref 22–32)
Calcium: 6 mg/dL — CL (ref 8.9–10.3)
Chloride: 98 mmol/L (ref 98–111)
Creatinine, Ser: 7.97 mg/dL — ABNORMAL HIGH (ref 0.61–1.24)
GFR, Estimated: 7 mL/min — ABNORMAL LOW (ref >60)
Glucose, Bld: 123 mg/dL — ABNORMAL HIGH (ref 70–99)
Phosphorus: 8.1 mg/dL — ABNORMAL HIGH (ref 2.5–4.6)
Potassium: 4.4 mmol/L (ref 3.5–5.1)
Sodium: 137 mmol/L (ref 135–145)

## 2022-10-16 LAB — CBC
HCT: 35.9 % — ABNORMAL LOW (ref 39.0–52.0)
Hemoglobin: 11.3 g/dL — ABNORMAL LOW (ref 13.0–17.0)
MCH: 27.8 pg (ref 26.0–34.0)
MCHC: 31.5 g/dL (ref 30.0–36.0)
MCV: 88.2 fL (ref 80.0–100.0)
Platelets: 239 10*3/uL (ref 150–400)
RBC: 4.07 MIL/uL — ABNORMAL LOW (ref 4.22–5.81)
RDW: 16.2 % — ABNORMAL HIGH (ref 11.5–15.5)
WBC: 8.5 10*3/uL (ref 4.0–10.5)
nRBC: 0 % (ref 0.0–0.2)

## 2022-10-16 LAB — HEPATITIS B SURFACE ANTIBODY, QUANTITATIVE: Hep B S AB Quant (Post): 46.5 m[IU]/mL

## 2022-10-16 NOTE — TOC Progression Note (Signed)
Transition of Care [facility]) - Progression Note    Patient Details  Name: Anthony Hess MRN: [id number] Date of Birth: 06/26/1955  Transition of Care [facility]) CM/SW Contact  Bing Quarry, RN Phone Number: 10/16/2022, 1:28 PM  Clinical Narrative:  8/24: Elwyn Lade to discharge yesterday was patient stated he had no money for transportation or hotel. CM had spoken with patient around 330 pm and progression notes indicated patient stated he had funds for this. Patient now states he won't have funds till the first of the month/disability check. Notes indicate multiple provision of resources for housing and transportation. Spouse does not drive. No friends to pick up. Transportation to HD per Medicare Quality Transport but only to HD or providers office, not on weekends. Cheyenne Adas does not take patients on respiratory precautions. Rolan Bucco when generically asked without giving any patient information, stated will transport patients using masks without any patient protected information given.  Voucher printed to unit RN to call for taxi after HD when patient ready around or after 6 pm. Updated provider and Unit RN.   Gabriel Cirri MSN RN CM  Transitions of Care Department [facility] [phone] Weekends Only     Expected Discharge Plan: Home/Self Care Barriers to Discharge: Continued Medical Work up  Expected Discharge Plan and Services       Living arrangements for the past 2 months: Single Family Home Expected Discharge Date: 10/15/22                                     Social Determinants of Health (SDOH) Interventions SDOH Screenings   Food Insecurity: Food Insecurity Present (10/15/2022)  Housing: High Risk (10/15/2022)  Transportation Needs: Unmet Transportation Needs (10/15/2022)  Utilities: At Risk (10/15/2022)  Depression (PHQ2-9): Low Risk  (08/23/2022)  Financial Resource Strain: Low Risk  (08/23/2022)  Physical Activity: Inactive (08/23/2022)  Social Connections:  Moderately Integrated (08/23/2022)  Stress: No Stress Concern Present (08/23/2022)  Tobacco Use: High Risk (10/15/2022)    Readmission Risk Interventions    09/17/2022   10:26 AM 09/15/2022    9:08 AM 04/28/2022    2:26 PM  Readmission Risk Prevention Plan  Transportation Screening Complete Complete Complete  Medication Review Oceanographer) Complete Complete Complete  PCP or Specialist appointment within 3-5 days of discharge Complete Complete Complete  HRI or Home Care Consult Complete Complete Complete  SW Recovery Care/Counseling Consult Complete Complete Complete  Palliative Care Screening Not Applicable Not Applicable Not Applicable  Skilled Nursing Facility Complete Not Applicable Not Applicable

## 2022-10-16 NOTE — Progress Notes (Addendum)
1930: Calcium: 6.0. Dr. Wynelle Link was notified.  2005: Venous pressure is low. Tx paused and awaiting new set up.

## 2022-10-16 NOTE — Progress Notes (Signed)
Hemodialysis note  Received patient in dialysis chair to unit. Alert and oriented.  Informed consent signed and in chart.  Treatment initiated: 1900 Treatment completed: 2230  Transported back to room, alert without acute distress.  Report given to patient's RN.   Access used: Right Chest HD Catheter.  Access issues: High Arterial pressure. Lines were reversed during tx. Still with Multiple alarms for high arterial pressure, Dr. Wynelle Link was notified and terminated HD.   Total UF removed: 400 ml (adjusted UF to 1L due to low BP) Medication(s) given:  none  Post HD weight: 52 kg   Wolfgang Phoenix [facility]

## 2022-10-16 NOTE — Plan of Care (Signed)

## 2022-10-16 NOTE — Progress Notes (Signed)
Progress Note   Patient: Anthony Hess WNU:[id number] DOB: 11-01-1955 DOA: 10/14/2022     1 DOS: the patient was seen and examined on 10/16/2022   Brief hospital course: Taken from H&P  Shunta Brain is a 67 y.o. male with medical history significant for end-stage renal disease on hemodialysis on TTS, missed Thursday and Tuesday as he could not get a ride per his report, HFrEF, dilated cardiomyopathy, bilateral carotid artery disease, dyslipidemia, hypertension and coronary artery disease status post CABG, who presented to the ER with acute onset of dyspnea that started today.  No chest pain, orthopnea or PND.  On arrival to ER blood pressure was elevated 167/122, otherwise stable.  COVID-19 PCR came back positive.  Labs with potassium of 5.2, calcium 6, BUN 67 and creatinine 9.77.  Troponin 118>>127 EKG likely ectopic rhythm, IVCD and T wave inversions laterally. Chest x-ray without any acute abnormality. CTA with a small nonocclusive subsegmental PE in the left lower lobe posterior artery.  This is similar to CT abdomen and pelvis done in June 2024.  No right heart strain.  Advanced emphysema and aortic atherosclerosis was noted. Not sure why home Eliquis was stopped and patient was started on heparin infusion.  Also received IV calcium gluconate.  8/23: Stop heparin and restart home Eliquis.  Patient is hemodynamically stable, no oxygen requirement and chest x-ray without any acute abnormality.  Dyspnea likely secondary to missed dialysis.  Mildly elevated troponin with left flat curve likely secondary to demand.  No chest pain. Echo done in July 2024 was normal EF and grade 1 diastolic dysfunction.  Patient was started on some supplement to boost of his immunity.  Otherwise at baseline.  Apparently broke up with his wife couple of days ago and currently homeless.  TOC provided the resources, likely be going to a motel until sort out his living situation.  Patient received dialysis and need to  continue with his routine dialysis as outpatient.  He will continue with rest of his home medications and follow-up with his providers for further recommendations.  8/24: Patient with recent separation from wife, supposed to leave yesterday after dialysis but did not left stating that he does not have any money or place to go.  He was provided with resources initially admitted with [facility] that he has money for a hotel for temporary stay, then keeps saying that he does not have any money or any place to go.  Likely will leave after dialysis today, getting his routine dialysis today.  Otherwise medically stable  Assessment and Plan: * Dyspnea - This could be related to his pulm embolism as well as slight fluid overload though his chest CTA and chest x-ray showed no pulmonary edema. - It could be partly related to COVID-19 though he is not hypoxic with it. - He will be admitted to a progressive unit bed. - We will monitor his pulse oximetry. - Management otherwise as below.  Acute pulmonary embolism (HCC) - While this was shown on a previous CTA and is nonocclusive, and should have been resolved by now being on Eliquis raising the concern for recurrent PE?? Patient remained asymptomatic and on room air. -Discontinue heparin infusion and restarting home Eliquis -Outpatient follow-up with PCP  Hypertensive urgency - We will continue his antihypertensives. - He will be placed on as needed IV labetalol and hydralazine.  End-stage renal disease on hemodialysis [facility]) - She is on hemodialysis on TTS. -Patient received dialysis and will continue with his routine as outpatient  COVID-19 virus infection - I believe that this was an accidental finding. - The patient has no CT findings of COVID-pneumonia. - He has no current hypoxia. - Conservative management will be pursued. - We will place him on mucolytic therapy and monitor pulse oximetry. - He will be placed on isolation,. - Will add vitamin C  and zinc sulfate.  GERD without esophagitis - We will continue PPI therapy.   Subjective: Patient with no new complaints, feeling at baseline and not telling me that he does not have any place or money to go to a hotel.  He wanted [place] to make arrangement for his living, explained to him that is not possible, we can provide him information for shelter and he can contact them if needed, or he can go back to his own home or at a friend/relatives place.  Physical Exam: Vitals:   10/15/22 1954 10/15/22 2028 10/16/22 0002 10/16/22 0851  BP:  (!) 82/32 95/61 122/88  Pulse:  84 78 76  Resp:  18 14 20   Temp:  98.4 F (36.9 C) (!) 97.4 F (36.3 C) 97.7 F (36.5 C)  TempSrc:  Oral Oral Oral  SpO2:  96% 97%   Weight: 49.9 kg     Height:       General.  Thin built gentleman, in no acute distress. Pulmonary.  Lungs clear bilaterally, normal respiratory effort. CV.  Regular rate and rhythm, no JVD, rub or murmur. Abdomen.  Soft, nontender, nondistended, BS positive. CNS.  Alert and oriented .  No focal neurologic deficit. Extremities.  No edema, no cyanosis, pulses intact and symmetrical. Psychiatry.  Judgment and insight appears normal.   Data Reviewed: Prior data reviewed  Family Communication:   Disposition: Status is: Observation The patient remains OBS appropriate and will d/c before 2 midnights.  Planned Discharge Destination: Home  DVT prophylaxis.  Eliquis Time spent: 40 minutes  This record has been created using Conservation officer, historic buildings. Errors have been sought and corrected,but may not always be located. Such creation errors do not reflect on the standard of care.   Author: Arnetha Courser, MD 10/16/2022 12:35 PM  For on call review [email or web address].

## 2022-10-16 NOTE — Progress Notes (Signed)
[facility]  ROUNDING NOTE   Subjective:   Anthony Hess is a 67 year old male with past medical conditions including hypertension, PVD, dyslipidemia, CAD, four-vessel CABG, and end-stage renal disease on hemodialysis. Patient presents to the emergency department with complaints of shortness of breath. Paietn has been admitted for Hypocalcemia [E83.51] Shortness of breath [R06.02] Dyspnea [R06.00] Pulmonary embolism, other, unspecified chronicity, unspecified whether acute cor pulmonale present (HCC) [I26.99] COVID-19 [U07.1]  Patient is known to our practice and receives outpatient dialysis at [facility] on a TTS schedule, followed by Dr Cherylann Ratel. Last treatment received on Tuesday, this was confirmed with outpatient clinic.   Patient seen laying in bed Alert and oriented Room air Partially completed breakfast tray at bedside States he's homeless, in [place]   Objective:  Vital signs in last 24 hours:  Temp:  [97.4 F (36.3 C)-98.6 F (37 C)] 97.7 F (36.5 C) (08/24 0851) Pulse Rate:  [35-99] 76 (08/24 0851) Resp:  [14-27] 20 (08/24 0851) BP: (68-149)/(32-107) 122/88 (08/24 0851) SpO2:  [92 %-99 %] 97 % (08/24 0002) Weight:  [49.9 kg-52.6 kg] 49.9 kg (08/23 1954)  Weight change: -9.543 kg Filed Weights   10/14/22 2226 10/15/22 1510 10/15/22 1954  Weight: 62.1 kg 52.6 kg 49.9 kg    Intake/Output: I/O last 3 completed shifts: In: 1 [IV Piggyback:50] Out: 2494.5 [Other:2494.5]   Intake/Output this shift:  No intake/output data recorded.  Physical Exam: General: NAD  Head: Normocephalic, atraumatic. Moist oral mucosal membranes  Eyes: Anicteric  Lungs:  Clear to auscultation, normal effort  Heart: Regular rate and rhythm  Abdomen:  Soft, nontender  Extremities:  No peripheral edema.  Neurologic: Alert and oriented, moving all four extremities  Skin: No lesions  Access: Rt chest pemcath    Basic Metabolic Panel: Recent Labs  Lab 10/13/22 1539  10/13/22 1815 10/14/22 2229 10/15/22 0431  NA 138 138 138 143  K 6.1* 4.9 5.2* 4.6  CL 98 103 98 103  CO2 23  --  17* 18*  GLUCOSE 115* 115* 72 92  BUN 48* 51* 67* 72*  CREATININE 8.63* 9.30* 9.77* 9.95*  CALCIUM 7.3*  --  6.0* 6.2*  MG 2.3  --   --   --     Liver Function Tests: Recent Labs  Lab 10/13/22 1539  AST 20  ALT 26  ALKPHOS 60  BILITOT 0.5  PROT 7.4  ALBUMIN 3.5   Recent Labs  Lab 10/13/22 1539  LIPASE 44   No results for input(s): "AMMONIA" in the last 168 hours.  CBC: Recent Labs  Lab 10/13/22 1539 10/13/22 1815 10/14/22 2229 10/15/22 0431 10/16/22 0446  WBC 7.6  --  8.9 8.4 8.5  NEUTROABS 5.7  --  6.4  --   --   HGB 12.1* 11.6* 10.7* 9.8* 11.3*  HCT 40.6 34.0* 35.8* 31.7* 35.9*  MCV 92.5  --  93.0 89.8 88.2  PLT 223  --  202 232 239    Cardiac Enzymes: No results for input(s): "CKTOTAL", "CKMB", "CKMBINDEX", "TROPONINI" in the last 168 hours.  BNP: Invalid input(s): "POCBNP"  CBG: No results for input(s): "GLUCAP" in the last 168 hours.  Microbiology: Results for orders placed or performed during the hospital encounter of 10/14/22  SARS Coronavirus 2 by RT PCR (hospital order, performed in [facility] hospital lab) *cepheid single result test* Anterior Nasal Swab     Status: Abnormal   Collection Time: 10/14/22 11:12 PM   Specimen: Anterior Nasal Swab  Result Value Ref  Range Status   SARS Coronavirus 2 by RT PCR POSITIVE (A) NEGATIVE Final    Comment: (NOTE) SARS-CoV-2 target nucleic acids are DETECTED  SARS-CoV-2 RNA is generally detectable in upper respiratory specimens  during the acute phase of infection.  Positive results are indicative  of the presence of the identified virus, but do not rule out bacterial infection or co-infection with other pathogens not detected by the test.  Clinical correlation with patient history and  other diagnostic information is necessary to determine patient infection status.  The expected  result is negative.  Fact Sheet for Patients:   RoadLapTop.co.za   Fact Sheet for Healthcare Providers:   http://kim-miller.com/    This test is not yet approved or cleared by the [place] FDA and  has been authorized for detection and/or diagnosis of SARS-CoV-2 by FDA under an Emergency Use Authorization (EUA).  This EUA will remain in effect (meaning this test can be used) for the duration of  the COVID-19 declaration under Section 564(b)(1)  of the Act, 21 U.S.C. section 360-bbb-3(b)(1), unless the authorization is terminated or revoked sooner.   Performed at [facility], [address] [place], [place] [address]     Coagulation Studies: Recent Labs    10/15/22 0011  LABPROT 14.3  INR 1.1    Urinalysis: No results for input(s): "COLORURINE", "LABSPEC", "PHURINE", "GLUCOSEU", "HGBUR", "BILIRUBINUR", "KETONESUR", "PROTEINUR", "UROBILINOGEN", "NITRITE", "LEUKOCYTESUR" in the last 72 hours.  Invalid input(s): "APPERANCEUR"    Imaging: US Venous Img Lower Bilateral (DVT)  Result Date: 10/15/2022 CLINICAL DATA:  New diagnosis of pulmonary emboli. Assess for residual DVT. EXAM: BILATERAL LOWER EXTREMITY VENOUS DOPPLER ULTRASOUND TECHNIQUE: Gray-scale sonography with graded compression, as well as color Doppler and duplex ultrasound were performed to evaluate the lower extremity deep venous systems from the level of the common femoral vein and including the common femoral, femoral, profunda femoral, popliteal and calf veins including the posterior tibial, peroneal and gastrocnemius veins when visible. The superficial great saphenous vein was also interrogated. Spectral Doppler was utilized to evaluate flow at rest and with distal augmentation maneuvers in the common femoral, femoral and popliteal veins. COMPARISON:  None Available. FINDINGS: RIGHT LOWER EXTREMITY Common Femoral Vein: No evidence of thrombus. Normal  compressibility, respiratory phasicity and response to augmentation. Saphenofemoral Junction: No evidence of thrombus. Normal compressibility and flow on color Doppler imaging. Profunda Femoral Vein: No evidence of thrombus. Normal compressibility and flow on color Doppler imaging. Femoral Vein: No evidence of thrombus. Normal compressibility, respiratory phasicity and response to augmentation. Popliteal Vein: No evidence of thrombus. Normal compressibility, respiratory phasicity and response to augmentation. Calf Veins: No evidence of thrombus. Normal compressibility and flow on color Doppler imaging. Superficial Great Saphenous Vein: No evidence of thrombus. Normal compressibility. Venous Reflux:  None. Other Findings:  None. LEFT LOWER EXTREMITY Common Femoral Vein: No evidence of thrombus. Normal compressibility, respiratory phasicity and response to augmentation. Saphenofemoral Junction: No evidence of thrombus. Normal compressibility and flow on color Doppler imaging. Profunda Femoral Vein: No evidence of thrombus. Normal compressibility and flow on color Doppler imaging. Femoral Vein: No evidence of thrombus. Normal compressibility, respiratory phasicity and response to augmentation. Popliteal Vein: No evidence of thrombus. Normal compressibility, respiratory phasicity and response to augmentation. Calf Veins: No evidence of thrombus. Normal compressibility and flow on color Doppler imaging. Superficial Great Saphenous Vein: No evidence of thrombus. Normal compressibility. Venous Reflux:  None. Other Findings:  None. IMPRESSION: No evidence of deep venous thrombosis in either lower extremity. Electronically  Signed   By: Malachy Moan M.D.   On: 10/15/2022 10:30   CT Angio Chest PE W and/or Wo Contrast  Result Date: 10/14/2022 CLINICAL DATA:  Shortness of breath EXAM: CT ANGIOGRAPHY CHEST WITH CONTRAST TECHNIQUE: Multidetector CT imaging of the chest was performed using the standard protocol during  bolus administration of intravenous contrast. Multiplanar CT image reconstructions and MIPs were obtained to evaluate the vascular anatomy. RADIATION DOSE REDUCTION: This exam was performed according to the departmental dose-optimization program which includes automated exposure control, adjustment of the mA and/or kV according to patient size and/or use of iterative reconstruction technique. CONTRAST:  75mL OMNIPAQUE IOHEXOL 350 MG/ML SOLN COMPARISON:  Chest radiograph 10/14/2022 and CTA chest 04/19/2022 FINDINGS: Cardiovascular: Tiny nonocclusive subsegmental pulmonary embolism in a left lower lobe posterior artery (4/95). This is similar in location to the pulmonary embolism seen on CT abdomen and pelvis 08/10/2022. No right heart strain. Sternotomy and CABG. Coronary artery and aortic atherosclerotic calcification. No pericardial effusion. Mediastinum/Nodes: Trachea and esophagus are unremarkable. No thoracic adenopathy. Lungs/Pleura: Advanced emphysema. Biapical pleural-parenchymal scarring. No focal pneumonia. No pleural effusion or pneumothorax. Upper Abdomen: No acute abnormality. Cysts/hemangiomas in the liver better characterized on prior imaging. Musculoskeletal: No acute fracture. Subacute or chronic right anterior third rib fracture. Review of the MIP images confirms the above findings. IMPRESSION: 1. Tiny nonocclusive subsegmental pulmonary embolism in a left lower lobe posterior artery. This is similar to CT abdomen and pelvis 08/10/2022. No right heart strain. 2. Advanced emphysema. Aortic Atherosclerosis (ICD10-I70.0) and Emphysema (ICD10-J43.9). Critical Value/emergent results were called by telephone at the time of interpretation on 10/14/2022 at 11:46 pm to provider [facility] , who verbally acknowledged these results. Electronically Signed   By: Minerva Fester M.D.   On: 10/14/2022 23:49   DG Chest 2 View  Result Date: 10/14/2022 CLINICAL DATA:  Shortness of breath EXAM: CHEST - 2 VIEW  COMPARISON:  Chest x-ray 10/13/2022 FINDINGS: Right-sided central venous catheter tip ends in the cavoatrial junction. Sternotomy wires are present. The heart size and mediastinal contours are within normal limits. Both lungs are clear. Acute fractures are seen. There is dextroconvex curvature of the thoracic spine. IMPRESSION: No active cardiopulmonary disease. Electronically Signed   By: Darliss Cheney M.D.   On: 10/14/2022 22:53     Medications:     amLODipine  2.5 mg Oral Daily   apixaban  5 mg Oral BID   vitamin C  250 mg Oral Daily   aspirin EC  81 mg Oral Daily   calcium acetate  667 mg Oral TID WC   Chlorhexidine Gluconate Cloth  6 each Topical Q0600   guaiFENesin  600 mg Oral BID   isosorbide dinitrate  10 mg Oral TID   metoprolol succinate  25 mg Oral Daily   pantoprazole  40 mg Oral Daily   zinc sulfate  220 mg Oral Daily   acetaminophen **OR** acetaminophen, chlorpheniramine-HYDROcodone, labetalol, magnesium hydroxide, ondansetron **OR** ondansetron (ZOFRAN) IV, traZODone  Assessment/ Plan:  Anthony Hess is a 67 y.o.  male with past medical conditions including hypertension, PVD, dyslipidemia, CAD, four-vessel CABG, and end-stage renal disease on hemodialysis. Patient presents to the emergency department with complaints of shortness of breath. Paietn has been admitted for Hypocalcemia [E83.51] Shortness of breath [R06.02] Dyspnea [R06.00] Pulmonary embolism, other, unspecified chronicity, unspecified whether acute cor pulmonale present (HCC) [I26.99] COVID-19 [U07.1]  [facility]/TTS/ Rt permcath  End stage renal disease on hemodialysis. Received treatment yesterday, UF 2.5L achieved. Will  received scheduled dialysis later today, due to Covid. Renal navigator and case management aware of social barriers and are assisting with options.   2. Anemia of chronic kidney disease Lab Results  Component Value Date   HGB 11.3 (L) 10/16/2022    Hgb at goal.   3. Secondary  Hyperparathyroidism:  Lab Results  Component Value Date   PTH 170 (H) 06/25/2022   CALCIUM 6.2 (LL) 10/15/2022   CAION 0.82 (LL) 10/13/2022   PHOS 3.4 09/17/2022   Found to be hypocalcemic, Give IV supplementation yesterday. Will order updated labs with treatment today  Prescribed calcitriol and calcium acetate with meals outpatient.   4. Hypertension with chronic kidney disease. Home regimen includes amlodipine, hydralazine, isosorbide, and metoprolol.           Hydralazine remains held. Blood presure stable, 122/88   LOS: 1 Nazire Fruth 8/24/202410:02 AM

## 2022-10-17 ENCOUNTER — Ambulatory Visit (HOSPITAL_COMMUNITY)
Admission: EM | Admit: 2022-10-17 | Discharge: 2022-10-17 | Disposition: A | Discharge status: Home / Self Care | Payer: Medicare Other | Attending: Psychiatry | Admitting: Psychiatry

## 2022-10-17 DIAGNOSIS — Z638 Other specified problems related to primary support group: Secondary | ICD-10-CM | POA: Diagnosis not present

## 2022-10-17 DIAGNOSIS — Z5901 Sheltered homelessness: Secondary | ICD-10-CM | POA: Diagnosis not present

## 2022-10-17 DIAGNOSIS — R45851 Suicidal ideations: Secondary | ICD-10-CM | POA: Insufficient documentation

## 2022-10-17 DIAGNOSIS — R0600 Dyspnea, unspecified: Secondary | ICD-10-CM | POA: Diagnosis not present

## 2022-10-17 NOTE — Progress Notes (Addendum)
Patients wife called nursing station and spoke to nurse that she received a phone call from the patient and he stated that he was going to kill himself by taking all of his pills.  I advised her to call 911 and gave her the address that he had been discharged to.    Patient did not express any suicidal ideations while in the hospital prior to discharge.

## 2022-10-17 NOTE — Plan of Care (Signed)

## 2022-10-17 NOTE — ED Provider Notes (Cosign Needed Addendum)
Behavioral Health Urgent Care Medical Screening Exam  Patient Name: Anthony Hess MRN: [id number] Date of Evaluation: 10/17/22 Chief Complaint:   passive suicidal ideation  Diagnosis:  Final diagnoses:  Passive suicidal ideations  Family discord    History of Present illness: Anthony Hess is a 67 y.o. male.  Presents to [facility] urgent care facility accompanied by [employer].  Patient was recently seen and evaluated by local emergency department for shortness of breath patient was discharged with follow-up planning.    Anthony Hess reported that he did not want to go home because he and his wife of 40 years are separating.  He reports she is very Holiday representative.  States he had been residing at the local homeless shelter for the past 4 days.  After discharge today patient was sent back to the [facility] which he reports was closed today so he does not have anywhere to stay tonight.  States thoughts of wanting to harm himself due to his current living situation.  He denied plan or intent.   Anthony Hess denied that he is suicidal or homicidal.  He denied previous inpatient admissions.  Denied that he is followed by therapy and/or psychiatry currently.  Denied that he is prescribed any psychotropic medications.  Denied illicit drug use or substance abuse history.  Anthony Hess  provided verbal authorization to follow-up with his significant other Anthony Hess at [phone].   Anthony Hess states that patient decided to leave home due to strict rules. " Anthony Hess doesn't listen and wants to do what he wants to do."   States she requested that patient takes care of his hygiene and takes his medications as directed.  She reports patient tested positive for COVID 5 days ago and states he is able to return home however he has to stay in his room. "  I have all elderly parents 80+ and I do not want him to get infected with COVID."  She reports she is also diagnosed with COPD.  States that patient will have to comply with caring  around handset exercises and wearing a mask while in the house.  She denied history of previous suicide or self injures behaviors. "  I am very disappointed in him for using resources in that matter." She reported he needs a negative antigen test.  Education provided with patient been asymptomatic, and would need to follow-up with primary care provider.   During evaluation Anthony Hess is sitting no acute distress. he is alert/oriented x 4; calm/cooperative; and mood congruent with affect.he is speaking in a clear tone at moderate volume, and normal pace; with good eye contact. His thought process is coherent and relevant; There is no indication that he is currently responding to internal/external stimuli or experiencing delusional thought content; and he has denied suicidal/self-harm/homicidal ideation, psychosis, and paranoia.   Patient has remained calm throughout assessment and has answered questions appropriately.     Anthony Hess is educated and verbalizes understanding of mental health resources and other crisis services in the community. he is instructed to call 911 and present to the nearest emergency room should he experience any suicidal/homicidal ideation, auditory/visual/hallucinations, or detrimental worsening of his mental health condition.he was a also advised by Clinical research associate that he could call the toll-free phone on insurance card to assist with identifying in network counselors and agencies or number on back of Medicaid card to speak with care coordinator.    Flowsheet Row ED from 10/17/2022 in [facility] ED to Hosp-Admission (Discharged) from 10/14/2022 in  [facility] ED from 09/20/2022 in [facility] Emergency Department at [facility]  C-SSRS RISK CATEGORY Low Risk No Risk No Risk       Psychiatric Specialty Exam  Presentation  General Appearance:Appropriate for Environment  Eye Contact:Good  Speech:Clear and  Coherent  Speech Volume:Normal  Handedness:Right   Mood and Affect  Mood:Euthymic  Affect:Congruent   Thought Process  Thought Processes:Coherent  Descriptions of Associations:Intact  Orientation:Full (Time, Place and Person)  Thought Content:Logical    Hallucinations:None  Ideas of Reference:None  Suicidal Thoughts:Yes, Passive Without Intent; Without Plan  Homicidal Thoughts:No   Sensorium  Memory:Immediate Fair; Recent Fair  Judgment:Fair  Insight:Fair   Executive Functions  Concentration:Fair  Attention Span:Good  Recall:Good  Fund of Knowledge:Good  Language:Good   Psychomotor Activity  Psychomotor Activity:Normal   Assets  Assets:Desire for Improvement; Social Support   Sleep  Sleep:Fair  Number of hours: 5  Physical Exam: Physical Exam Vitals and nursing note reviewed.  Pulmonary:     Effort: Pulmonary effort is normal.     Breath sounds: Normal breath sounds.  Neurological:     Mental Status: He is alert and oriented to person, place, and time.  Psychiatric:        Mood and Affect: Mood normal.        Behavior: Behavior normal.    Review of Systems  Cardiovascular: Negative.   Neurological: Negative.   Psychiatric/Behavioral:  Positive for depression. The patient is nervous/anxious.   All other systems reviewed and are negative.  Blood pressure (!) 123/93, pulse 81, temperature 98.8 F (37.1 C), temperature source Oral, resp. rate 17, SpO2 100%. There is no height or weight on file to calculate BMI.  Musculoskeletal: Strength & Muscle Tone: within normal limits Gait & Station: normal Patient leans: N/A   [facility] MSE Discharge Disposition for Follow up and Recommendations: Based on my evaluation the patient does not appear to have an emergency medical condition and can be discharged with resources and follow up care in outpatient services for Medication Management and Individual Therapy   Oneta Rack,  NP 10/17/2022, 6:11 PM

## 2022-10-17 NOTE — TOC Transition Note (Signed)
Transition of Care [facility]) - CM/SW Discharge Note   Patient Details  Name: Anthony Hess MRN: [id number] Date of Birth: 1955/10/13  Transition of [facility]) CM/SW Contact:  Bing Quarry, RN Phone Number: 10/17/2022, 12:24 PM   Clinical Narrative:  8/25: RN CM advised on potential barriers to discharge today including Covid status going to a community shelter, access to HD chair from [facility] shelter in [place] as currently with [facility] in [place] on TTS, new homeless status, transportation issues, and money issues.  Resources have been given on multiple occasions. HD Coordinator not available to check on patient being able to find a chair by Tuesday if still in [facility] are [employer]. Patient essentially homeless as spouse will not allow return. Patient was at [facility] last week. Per Unit RN, RN checked with nephrology and was told he could go. Hospitalist provider feels patient can go as well. See RN progress note. Pt was advised to wear mask and contact HD center tomorrow ASAP to arrange TTS HD chair/arrangements. Taxi voucher provided by RN CM for transport via [employer] taxi service to be arranged by Unit RN when patient is ready.   Gabriel Cirri MSN RN CM  Transitions of Care Department [facility] [phone] Weekends Only       Barriers to Discharge: Continued Medical Work up   Patient Goals and CMS Choice CMS Medicare.gov Compare Post Acute Care list provided to:: Patient Choice offered to / list presented to : Patient  Discharge Placement                         Discharge Plan and Services Additional resources added to the After Visit Summary for                                       Social Determinants of Health (SDOH) Interventions SDOH Screenings   Food Insecurity: Food Insecurity Present (10/15/2022)  Housing: High Risk (10/15/2022)  Transportation Needs: Unmet Transportation Needs (10/15/2022)  Utilities: At Risk (10/15/2022)   Depression (PHQ2-9): Low Risk  (08/23/2022)  Financial Resource Strain: Low Risk  (08/23/2022)  Physical Activity: Inactive (08/23/2022)  Social Connections: Moderately Integrated (08/23/2022)  Stress: No Stress Concern Present (08/23/2022)  Tobacco Use: High Risk (10/15/2022)     Readmission Risk Interventions    09/17/2022   10:26 AM 09/15/2022    9:08 AM 04/28/2022    2:26 PM  Readmission Risk Prevention Plan  Transportation Screening Complete Complete Complete  Medication Review Oceanographer) Complete Complete Complete  PCP or Specialist appointment within 3-5 days of discharge Complete Complete Complete  HRI or Home Care Consult Complete Complete Complete  SW Recovery Care/Counseling Consult Complete Complete Complete  Palliative Care Screening Not Applicable Not Applicable Not Applicable  Skilled Nursing Facility Complete Not Applicable Not Applicable

## 2022-10-17 NOTE — Progress Notes (Signed)
   10/17/22 1713  [facility] Triage Screening (Walk-ins at [facility] only)  How Did You Hear About [place]? Legal System  What Is the Reason for Your Visit/Call Today? Pt arrived voluntarily to [facility] via [employer]. Pt states that he feels like hurting himself. Pt states that he needs to talk to someone and possibly stay here for one night to be safe. Per [employer], two calls were placed to them. The first call was placed by a cab driver stating that the pt statedhe was threatening to take all of his medication. The second call was placed by the pt, telling [employer] that "he was only thinking about hurting himself". Pt was picked up by [employer] from the [employer] station. Pt endorses SI at this present moment. Pt denies HI, AVH and the use of alcohol or drugs at this current time. Pt states that he does dialysis 3 days a week (Tues, Faith, & Sat).  How Long Has This Been Causing You Problems? 1 wk - 1 month  Have You Recently Had Any Thoughts About Hurting Yourself? Yes  How long ago did you have thoughts about hurting yourself? about a month  Are You Planning to Commit Suicide/Harm Yourself At This time? No  Have you Recently Had Thoughts About Hurting Someone Karolee Ohs? No  Are You Planning To Harm Someone At This Time? No  Are you currently experiencing any auditory, visual or other hallucinations? No  Have You Used Any Alcohol or Drugs in the Past 24 Hours? No  Do you have any current medical co-morbidities that require immediate attention? No  Clinician description of patient physical appearance/behavior: calm, cooperative  What Do You Feel Would Help You the Most Today? Social Support  If access to [facility] was not available, would you have sought care in the Emergency Department? No  Determination of Need Emergent (2 hours)  Options For Referral Outpatient Therapy;Medication Management

## 2022-10-17 NOTE — TOC Progression Note (Signed)
Transition of Care [facility]) - Progression Note    Patient Details  Name: Anthony Hess MRN: [id number] Date of Birth: [birth year]  Transition of Care [facility]) CM/SW Contact  Bing Quarry, RN Phone Number: 10/17/2022, 9:15 AM  Clinical Narrative:  8/25: Barriers to discharge. Patient received HD from 1900 to 2230 on 10/16/22. It was originally schedule for 2-6 pm with a taxi voucher prepared for his discharge transportation. At 2230 at night the patient told nursing staff he had no where to go even with taxi voucher.   He and his wife/significant other recently separated and he is not allowed to come back nor wants to per patient to Unit RN. Shelters in [facility] are closed for intake over the weekend. Patient is also Covid+ which will likely be a barrier for shelter placement during the week.   He then told his unit RN today that he as at the [facility] in [place] last week and wants to go there, but also closed for intake over the weekends. He has been given multiple housing/food/transportation resources this and last admission.   He has Medicare transportation to his HD but in [place], [place]--if he were to go to [facility] in [place], [place]. Per a [facility] SW patients/homeless can show up for the night around 630 pm, but HD Coordinator will need to be contacted Monday regarding transportation or possibly changing HD seat to [place], [place] so patient does not miss HD again.   TOC will follow up with HD Navigator Monday.   Gabriel Cirri MSN RN CM  Transitions of Care Department [facility] [phone] Weekends Only    HD Outpatient Facility [facility] [phone] [address] [place], [place] [address] [phone]   Schedule: Tuesday Thursday and Saturday @ 6:00am   Dimas Chyle Dialysis Coordinator II  [facility] Cell: [phone] eFax: [phone] [email or web address]        Expected Discharge Plan: Home/Self Care Barriers to Discharge: Continued Medical Work  up  Expected Discharge Plan and Services       Living arrangements for the past 2 months: Single Family Home Expected Discharge Date: 10/15/22                                     Social Determinants of Health (SDOH) Interventions SDOH Screenings   Food Insecurity: Food Insecurity Present (10/15/2022)  Housing: High Risk (10/15/2022)  Transportation Needs: Unmet Transportation Needs (10/15/2022)  Utilities: At Risk (10/15/2022)  Depression (PHQ2-9): Low Risk  (08/23/2022)  Financial Resource Strain: Low Risk  (08/23/2022)  Physical Activity: Inactive (08/23/2022)  Social Connections: Moderately Integrated (08/23/2022)  Stress: No Stress Concern Present (08/23/2022)  Tobacco Use: High Risk (10/15/2022)    Readmission Risk Interventions    09/17/2022   10:26 AM 09/15/2022    9:08 AM 04/28/2022    2:26 PM  Readmission Risk Prevention Plan  Transportation Screening Complete Complete Complete  Medication Review Oceanographer) Complete Complete Complete  PCP or Specialist appointment within 3-5 days of discharge Complete Complete Complete  HRI or Home Care Consult Complete Complete Complete  SW Recovery Care/Counseling Consult Complete Complete Complete  Palliative Care Screening Not Applicable Not Applicable Not Applicable  Skilled Nursing Facility Complete Not Applicable Not Applicable

## 2022-10-17 NOTE — Progress Notes (Signed)
Patient escorted to medical mall via wheelchair by nursing staff and put into the cab [facility]).

## 2022-10-17 NOTE — Discharge Instructions (Signed)

## 2022-10-17 NOTE — Progress Notes (Signed)
Patient alert and oriented, vss.   Patient currently "homeless" and is to be discharged to [facility] address via taxi.    [employer] cab called.    D/C PIV.  Will escort patient to medical mall when taxi arrives.   Patient educated that he needs to follow up with HD upon discharge.

## 2022-10-18 ENCOUNTER — Telehealth: Payer: Self-pay

## 2022-10-18 NOTE — Transitions of Care (Post Inpatient/ED Visit) (Unsigned)
   10/18/2022  Name: Anthony Hess MRN: [id number] DOB: [birth year]  Today's TOC FU Call Status: Today's TOC FU Call Status:: Unsuccessful Call (1st Attempt) Unsuccessful Call (1st Attempt) Date: 10/18/22  Attempted to reach the patient regarding the most recent Inpatient/ED visit.  Follow Up Plan: Additional outreach attempts will be made to reach the patient to complete the Transitions of Care (Post Inpatient/ED visit) call.   Signature Karena Addison, LPN [facility] Nurse Health Advisor Direct Dial [phone]

## 2022-10-19 NOTE — Transitions of Care (Post Inpatient/ED Visit) (Unsigned)
   10/19/2022  Name: Anthony Hess MRN: [id number] DOB: June 08, 1955  Today's TOC FU Call Status: Today's TOC FU Call Status:: Unsuccessful Call (2nd Attempt) Unsuccessful Call (1st Attempt) Date: 10/18/22 Unsuccessful Call (2nd Attempt) Date: 10/19/22  Attempted to reach the patient regarding the most recent Inpatient/ED visit.  Follow Up Plan: Additional outreach attempts will be made to reach the patient to complete the Transitions of Care (Post Inpatient/ED visit) call.   Signature Karena Addison, LPN [facility] Nurse Health Advisor Direct Dial [phone]

## 2022-10-20 NOTE — Transitions of Care (Post Inpatient/ED Visit) (Signed)
   10/20/2022  Name: Anthony Hess MRN: [id number] DOB: [birth year]  Today's TOC FU Call Status: Today's TOC FU Call Status:: Unsuccessful Call (2nd Attempt) Unsuccessful Call (1st Attempt) Date: 10/18/22 Unsuccessful Call (2nd Attempt) Date: 10/19/22 Unsuccessful Call (3rd Attempt) Date: 10/20/22  Attempted to reach the patient regarding the most recent Inpatient/ED visit.  Follow Up Plan: No further outreach attempts will be made at this time. We have been unable to contact the patient.  Signature Karena Addison, LPN [facility] Nurse Health Advisor Direct Dial [phone]

## 2022-10-21 ENCOUNTER — Telehealth (INDEPENDENT_AMBULATORY_CARE_PROVIDER_SITE_OTHER): Payer: Self-pay

## 2022-10-21 ENCOUNTER — Encounter: Payer: Self-pay | Admitting: Emergency Medicine

## 2022-10-21 ENCOUNTER — Emergency Department: Payer: Medicare Other

## 2022-10-21 ENCOUNTER — Other Ambulatory Visit: Payer: Self-pay

## 2022-10-21 DIAGNOSIS — N186 End stage renal disease: Secondary | ICD-10-CM | POA: Insufficient documentation

## 2022-10-21 DIAGNOSIS — I132 Hypertensive heart and chronic kidney disease with heart failure and with stage 5 chronic kidney disease, or end stage renal disease: Secondary | ICD-10-CM | POA: Insufficient documentation

## 2022-10-21 DIAGNOSIS — I5022 Chronic systolic (congestive) heart failure: Secondary | ICD-10-CM | POA: Insufficient documentation

## 2022-10-21 DIAGNOSIS — Y841 Kidney dialysis as the cause of abnormal reaction of the patient, or of later complication, without mention of misadventure at the time of the procedure: Secondary | ICD-10-CM | POA: Diagnosis not present

## 2022-10-21 DIAGNOSIS — T8249XA Other complication of vascular dialysis catheter, initial encounter: Secondary | ICD-10-CM | POA: Insufficient documentation

## 2022-10-21 DIAGNOSIS — Z7901 Long term (current) use of anticoagulants: Secondary | ICD-10-CM | POA: Diagnosis not present

## 2022-10-21 DIAGNOSIS — T8241XA Breakdown (mechanical) of vascular dialysis catheter, initial encounter: Secondary | ICD-10-CM | POA: Diagnosis not present

## 2022-10-21 DIAGNOSIS — Z7982 Long term (current) use of aspirin: Secondary | ICD-10-CM | POA: Insufficient documentation

## 2022-10-21 DIAGNOSIS — Z992 Dependence on renal dialysis: Secondary | ICD-10-CM | POA: Insufficient documentation

## 2022-10-21 DIAGNOSIS — F17210 Nicotine dependence, cigarettes, uncomplicated: Secondary | ICD-10-CM | POA: Diagnosis not present

## 2022-10-21 DIAGNOSIS — Z789 Other specified health status: Secondary | ICD-10-CM | POA: Diagnosis not present

## 2022-10-21 LAB — CBC
HCT: 34.6 % — ABNORMAL LOW (ref 39.0–52.0)
Hemoglobin: 10.3 g/dL — ABNORMAL LOW (ref 13.0–17.0)
MCH: 28.5 pg (ref 26.0–34.0)
MCHC: 29.8 g/dL — ABNORMAL LOW (ref 30.0–36.0)
MCV: 95.6 fL (ref 80.0–100.0)
Platelets: 299 10*3/uL (ref 150–400)
RBC: 3.62 MIL/uL — ABNORMAL LOW (ref 4.22–5.81)
RDW: 17.2 % — ABNORMAL HIGH (ref 11.5–15.5)
WBC: 10.1 10*3/uL (ref 4.0–10.5)
nRBC: 0 % (ref 0.0–0.2)

## 2022-10-21 LAB — COMPREHENSIVE METABOLIC PANEL
ALT: 25 U/L (ref 0–44)
AST: 18 U/L (ref 15–41)
Albumin: 3.4 g/dL — ABNORMAL LOW (ref 3.5–5.0)
Alkaline Phosphatase: 54 U/L (ref 38–126)
Anion gap: 19 — ABNORMAL HIGH (ref 5–15)
BUN: 71 mg/dL — ABNORMAL HIGH (ref 8–23)
CO2: 18 mmol/L — ABNORMAL LOW (ref 22–32)
Calcium: 6.7 mg/dL — ABNORMAL LOW (ref 8.9–10.3)
Chloride: 103 mmol/L (ref 98–111)
Creatinine, Ser: 9.19 mg/dL — ABNORMAL HIGH (ref 0.61–1.24)
GFR, Estimated: 6 mL/min — ABNORMAL LOW (ref >60)
Glucose, Bld: 92 mg/dL (ref 70–99)
Potassium: 4.8 mmol/L (ref 3.5–5.1)
Sodium: 140 mmol/L (ref 135–145)
Total Bilirubin: 0.4 mg/dL (ref 0.3–1.2)
Total Protein: 6.7 g/dL (ref 6.5–8.1)

## 2022-10-21 NOTE — ED Triage Notes (Signed)
PT here for clogged dialysis port, last full treatment was Saturday

## 2022-10-21 NOTE — Telephone Encounter (Signed)
I attempted to contact the patient regarding a permcath exchange. Patient's mailbox is full. I then attempted to contact the patients spouse and a message was left for a return call. Patient's spouse  called back and I explained that the hospital schedule is completely booked for 10/22/22 and since the dialysis center booked him for [facility] then he will need to go there or to the ED for evaluation. Patient's spouse understood.

## 2022-10-21 NOTE — ED Notes (Signed)
Pt arrived via [facility], has clogged dialysis port, unable to complete treatment, only did 1/2 treatment on Tuesday, pt last had a full treatment on Saturday.  Pt goes to [place] on 9/4 to get catheter replaced. This is the patients 3rd catheter.

## 2022-10-22 ENCOUNTER — Ambulatory Visit
Admission: EM | Admit: 2022-10-22 | Discharge: 2022-10-22 | Disposition: A | Discharge status: Home / Self Care | Payer: Medicare Other | Attending: Emergency Medicine | Admitting: Emergency Medicine

## 2022-10-22 ENCOUNTER — Encounter
Admission: EM | Disposition: A | Discharge status: Home / Self Care | Payer: Self-pay | Source: Home / Self Care | Attending: Student in an Organized Health Care Education/Training Program

## 2022-10-22 DIAGNOSIS — Z789 Other specified health status: Secondary | ICD-10-CM

## 2022-10-22 DIAGNOSIS — T8241XA Breakdown (mechanical) of vascular dialysis catheter, initial encounter: Secondary | ICD-10-CM

## 2022-10-22 DIAGNOSIS — N186 End stage renal disease: Secondary | ICD-10-CM | POA: Diagnosis not present

## 2022-10-22 DIAGNOSIS — Z992 Dependence on renal dialysis: Secondary | ICD-10-CM

## 2022-10-22 DIAGNOSIS — T8249XA Other complication of vascular dialysis catheter, initial encounter: Secondary | ICD-10-CM | POA: Diagnosis not present

## 2022-10-22 HISTORY — PX: DIALYSIS/PERMA CATHETER REPAIR: CATH118293

## 2022-10-22 LAB — HEPATITIS B SURFACE ANTIGEN: Hepatitis B Surface Ag: NONREACTIVE

## 2022-10-22 SURGERY — DIALYSIS/PERMA CATHETER REPAIR
Anesthesia: Moderate Sedation

## 2022-10-22 MED ORDER — FENTANYL CITRATE (PF) 100 MCG/2ML IJ SOLN
INTRAMUSCULAR | Status: AC
  Filled 2022-10-22: qty 2

## 2022-10-22 MED ORDER — LIDOCAINE-EPINEPHRINE (PF) 1 %-1:200000 IJ SOLN
INTRAMUSCULAR | Status: DC | PRN
  Administered 2022-10-22: 10 mL

## 2022-10-22 MED ORDER — FAMOTIDINE 20 MG PO TABS: 40.0000 mg | ORAL_TABLET | Freq: Once | ORAL | Status: DC | PRN

## 2022-10-22 MED ORDER — DIPHENHYDRAMINE HCL 50 MG/ML IJ SOLN: 50.0000 mg | Freq: Once | INTRAMUSCULAR | Status: DC | PRN

## 2022-10-22 MED ORDER — LABETALOL HCL 5 MG/ML IV SOLN
10.0000 mg | Freq: Once | INTRAVENOUS | Status: AC
  Administered 2022-10-22: 10 mg via INTRAVENOUS
  Filled 2022-10-22: qty 4

## 2022-10-22 MED ORDER — MIDAZOLAM HCL 2 MG/2ML IJ SOLN
INTRAMUSCULAR | Status: AC
  Filled 2022-10-22: qty 2

## 2022-10-22 MED ORDER — SODIUM CHLORIDE 0.9 % IV SOLN: INTRAVENOUS | Status: DC

## 2022-10-22 MED ORDER — HYDROMORPHONE HCL 1 MG/ML IJ SOLN: 1.0000 mg | Freq: Once | INTRAMUSCULAR | Status: DC | PRN

## 2022-10-22 MED ORDER — HYDRALAZINE HCL 20 MG/ML IJ SOLN
INTRAMUSCULAR | Status: DC | PRN
  Administered 2022-10-22 (×2): 10 mg via INTRAVENOUS

## 2022-10-22 MED ORDER — FENTANYL CITRATE PF 50 MCG/ML IJ SOSY: 12.5000 ug | PREFILLED_SYRINGE | Freq: Once | INTRAMUSCULAR | Status: DC | PRN

## 2022-10-22 MED ORDER — MIDAZOLAM HCL 2 MG/ML PO SYRP: 8.0000 mg | ORAL_SOLUTION | Freq: Once | ORAL | Status: DC | PRN

## 2022-10-22 MED ORDER — HYDRALAZINE HCL 20 MG/ML IJ SOLN
INTRAMUSCULAR | Status: AC
  Filled 2022-10-22: qty 1

## 2022-10-22 MED ORDER — MIDAZOLAM HCL 2 MG/2ML IJ SOLN
INTRAMUSCULAR | Status: DC | PRN
  Administered 2022-10-22: 2 mg via INTRAVENOUS

## 2022-10-22 MED ORDER — HEPARIN SODIUM (PORCINE) 1000 UNIT/ML IJ SOLN
INTRAMUSCULAR | Status: DC | PRN
  Administered 2022-10-22: 10000 [IU]

## 2022-10-22 MED ORDER — CEFAZOLIN SODIUM-DEXTROSE 1-4 GM/50ML-% IV SOLN
INTRAVENOUS | Status: AC
  Filled 2022-10-22: qty 50

## 2022-10-22 MED ORDER — HEPARIN SODIUM (PORCINE) 10000 UNIT/ML IJ SOLN
INTRAMUSCULAR | Status: AC
  Filled 2022-10-22: qty 1

## 2022-10-22 MED ORDER — METHYLPREDNISOLONE SODIUM SUCC 125 MG IJ SOLR: 125.0000 mg | Freq: Once | INTRAMUSCULAR | Status: DC | PRN

## 2022-10-22 MED ORDER — CEFAZOLIN SODIUM-DEXTROSE 1-4 GM/50ML-% IV SOLN
1.0000 g | INTRAVENOUS | Status: DC
  Filled 2022-10-22: qty 50

## 2022-10-22 MED ORDER — FENTANYL CITRATE (PF) 100 MCG/2ML IJ SOLN
INTRAMUSCULAR | Status: DC | PRN
  Administered 2022-10-22: 50 ug via INTRAVENOUS

## 2022-10-22 MED ORDER — HEPARIN (PORCINE) IN NACL 2000-0.9 UNIT/L-% IV SOLN
INTRAVENOUS | Status: DC | PRN
  Administered 2022-10-22: 1000 mL

## 2022-10-22 MED ORDER — ONDANSETRON HCL 4 MG/2ML IJ SOLN: 4.0000 mg | Freq: Four times a day (QID) | INTRAMUSCULAR | Status: DC | PRN

## 2022-10-22 SURGICAL SUPPLY — 4 items
CATH PALINDROME-P 19CM W/VT (CATHETERS) IMPLANT
GUIDEWIRE SUPER STIFF .035X180 (WIRE) IMPLANT
SUT MNCRL AB 4-0 PS2 18 (SUTURE) IMPLANT
SUT SILK 0 FSL (SUTURE) IMPLANT

## 2022-10-22 NOTE — Consult Note (Signed)
Hospital Consult    Reason for Consult:  Permacath Exchange  Requesting Physician:  Dr Chiquita Loth MD MRN #:  [id number]  History of Present Illness: This is a 67 y.o. male referred to the ED by vascular surgery office for clogged permacath. 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 States he was last fully dialyzed last Saturday. Had 1 hour out of his usual 4 hours of dialysis on Tuesday because his access became clogged. Dialysis unable to access his catheter yesterday. They scheduled him for an appointment with a vascular surgeon in [place] but that is not until next week. Patient denies fever/chills, chest pain, shortness of breath, abdominal pain, nausea, vomiting or dizziness. Vascular Surgery consulted from the ED for Permcath exchange today.   Past Medical History:  Diagnosis Date   Aortic atherosclerosis (HCC)    Bilateral carotid artery disease (HCC)    Bladder cancer (HCC)    Coronary artery disease 12/20/2018   a.) LHC 12/20/2018: 50% OM1, 40% OM2, 95% o-pLAD, 75% o=pLCx, 40% mLM, 70% D1, 60% mRCA-1, 50% mRCA-2; refer to CVTS. b.) 4v CABG at [facility] on 12/27/2018: LIMA-LAD, RIMA-PDA, seg LRA-OM1-D1   DCM (dilated cardiomyopathy) (HCC) 12/05/2018   a.) TTE 12/05/2018: EF 40-45%. b.) TTE 12/28/2019: EF 20-25%.   Dyspnea 10/15/2022   ESRD (end stage renal disease) (HCC)    a.) T-Th-Sat   HFrEF (heart failure with reduced ejection fraction) (HCC) 12/05/2018   a.) TTE 12/05/2018: EF 40-45%; mild LVH; ant/apical/sep HK; mild TR . b.) TTE 12/28/2019: EF 20-25%; mod LVH; mod MR/AR; G1DD.   History of 2019 novel coronavirus disease (COVID-19) 04/08/2021   History of kidney stones    HLD (hyperlipidemia)    Hx of CABG 12/27/2018   Hypertension    Infrarenal abdominal aortic aneurysm (AAA) without rupture (HCC) 03/05/2021   a.) CT abd/pelvis; measured 3.2 cm.   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   a.) LIMA-LAD, RIMA-PDA, sequential LEFT radial artery to OM1 and D1   Sepsis (HCC) 03/14/2021   Wears glasses     Past Surgical History:  Procedure Laterality Date   AV FISTULA PLACEMENT Left 07/30/2021   Procedure: INSERTION OF ARTERIOVENOUS (AV) GORE-TEX GRAFT ARM BRACHIAL ARTERY TO AXILLARY VEIN;  Surgeon: Annice Needy, MD;  Location: [facility] ORS;  Service: Vascular;  Laterality: Left;   CAPD INSERTION N/A 12/31/2019   Procedure: LAPAROSCOPIC INSERTION CONTINUOUS AMBULATORY PERITONEAL DIALYSIS  (CAPD) CATHETER;  Surgeon: Leafy Ro, MD;  Location: [facility];  Service: General;  Laterality: N/A;   CAPD REMOVAL N/A 04/10/2020   Procedure: LAPAROSCOPIC REVISION OF CONTINUOUS AMBULATORY PERITONEAL DIALYSIS  (CAPD) CATHETER;  Surgeon: Leafy Ro, MD;  Location: [facility];  Service: General;  Laterality: N/A;   CORONARY ARTERY BYPASS GRAFT N/A 12/27/2018   Procedure: CORONARY ARTERY BYPASS GRAFTING (CABG) X 4 ON PUMP USING RIGHT & LEFT INTERNAL MAMMARY ARTERY LEFT RADIAL ARTERY ENDOSCOPICALLY HARVESTED;  Surgeon: Linden Dolin, MD;  Location: [facility];  Service: Open Heart Surgery;  Laterality: N/A;   CYSTOSCOPY W/ RETROGRADES Bilateral 05/15/2019   Procedure: CYSTOSCOPY WITH RETROGRADE PYELOGRAM;  Surgeon: Riki Altes, MD;  Location: [facility];  Service: Urology;  Laterality: Bilateral;   CYSTOSCOPY WITH BIOPSY N/A 05/15/2019   Procedure: CYSTOSCOPY WITH bladder BIOPSY;  Surgeon: Irineo Axon  C, MD;  Location: [facility];  Service: Urology;  Laterality: N/A;   DIALYSIS/PERMA CATHETER INSERTION N/A 12/28/2019   Procedure: DIALYSIS/PERMA CATHETER INSERTION;  Surgeon: Annice Needy, MD;  Location: [facility];  Service: Cardiovascular;  Laterality: N/A;   DIALYSIS/PERMA CATHETER INSERTION N/A 03/18/2021   Procedure: DIALYSIS/PERMA CATHETER INSERTION;  Surgeon: Annice Needy, MD;   Location: [facility];  Service: Cardiovascular;  Laterality: N/A;   DIALYSIS/PERMA CATHETER REMOVAL N/A 06/02/2020   Procedure: DIALYSIS/PERMA CATHETER REMOVAL;  Surgeon: Annice Needy, MD;  Location: [facility];  Service: Cardiovascular;  Laterality: N/A;   EXCHANGE OF A DIALYSIS CATHETER Right 04/10/2020   Procedure: EXCHANGE OF A DIALYSIS CATHETER;  Surgeon: Leafy Ro, MD;  Location: [facility];  Service: General;  Laterality: Right;   INCISIONAL HERNIA REPAIR  01/20/2021   Procedure: HERNIA REPAIR INCISIONAL;  Surgeon: Henrene Dodge, MD;  Location: [facility];  Service: General;;   IR IMAGE GUIDED DRAINAGE PERCUT CATH  PERITONEAL RETROPERIT  04/07/2020   LAPAROSCOPY N/A 04/16/2021   Procedure: LAPAROSCOPY DIAGNOSTIC;  Surgeon: Sung Amabile, DO;  Location: [facility];  Service: General;  Laterality: N/A;   LAPAROTOMY N/A 04/26/2022   Procedure: EXPLORATORY LAPAROTOMY WITH REPAIR OF DUODENAL ULCER;  Surgeon: Carolan Shiver, MD;  Location: [facility];  Service: General;  Laterality: N/A;   LEFT HEART CATH AND CORONARY ANGIOGRAPHY Left 12/20/2018   Procedure: LEFT HEART CATH AND CORONARY ANGIOGRAPHY;  Surgeon: Marcina Millard, MD;  Location: [facility];  Service: Cardiovascular;  Laterality: Left;   RADIAL ARTERY HARVEST Left 12/27/2018   Procedure: ENDOSCOPIC RADIAL ARTERY HARVEST;  Surgeon: Linden Dolin, MD;  Location: [facility];  Service: Open Heart Surgery;  Laterality: Left;   REMOVAL OF A DIALYSIS CATHETER Left 03/20/2021   Procedure: REMOVAL OF A PD CATHETER;  Surgeon: Annice Needy, MD;  Location: [facility];  Service: Vascular;  Laterality: Left;   REVISION OF ARTERIOVENOUS GORETEX GRAFT Left 09/11/2021   Procedure: Excisionof infected AV graft;  Surgeon: Renford Dills, MD;  Location: [facility];  Service: Vascular;  Laterality: Left;   TEE WITHOUT CARDIOVERSION N/A 12/27/2018   Procedure: TRANSESOPHAGEAL ECHOCARDIOGRAM (TEE);  Surgeon: Linden Dolin,  MD;  [facility];  Service: Open Heart Surgery;  Laterality: N/A;   TUMOR REMOVAL  2019   Bladder    No Known Allergies  Prior to Admission medications   Medication Sig Start Date End Date Taking? Authorizing Provider  amLODipine (NORVASC) 2.5 MG tablet Take 1 tablet (2.5 mg total) by mouth daily. 05/07/22   Alford Highland, MD  apixaban (ELIQUIS) 5 MG TABS tablet Take 1 tablet (5 mg total) by mouth 2 (two) times daily. 05/01/22   Baron Hamper, MD  ascorbic acid (VITAMIN C) 250 MG tablet Take 2 tablets (500 mg total) by mouth daily. 10/16/22   Arnetha Courser, MD  aspirin EC 81 MG tablet Take 81 mg by mouth daily. Patient not taking: Reported on 10/15/2022 05/06/20   [provider]  benzonatate (TESSALON PERLES) 100 MG capsule Take 1 capsule (100 mg total) by mouth 3 (three) times daily as needed for cough. 10/15/22 10/15/23  Arnetha Courser, MD  calcium acetate (PHOSLO) 667 MG capsule Take 1 capsule (667 mg total) by mouth 3 (three) times daily with meals. 11/18/21   Sunnie Nielsen, DO  guaiFENesin (MUCINEX) 600 MG 12 hr tablet Take 1 tablet (600 mg total) by mouth 2 (two) times daily. 10/15/22  Arnetha Courser, MD  isosorbide dinitrate (ISORDIL) 10 MG tablet Take 1 tablet (10 mg total) by mouth 3 (three) times daily. 10/15/22   Arnetha Courser, MD  metoprolol succinate (TOPROL-XL) 25 MG 24 hr tablet Take 25 mg by mouth daily. 03/12/22   [provider]  ondansetron (ZOFRAN-ODT) 4 MG disintegrating tablet Take 1 tablet (4 mg total) by mouth every 8 (eight) hours as needed for nausea or vomiting. 06/25/22   Simmons-Robinson, Makiera, MD  pantoprazole (PROTONIX) 40 MG tablet Take 1 tablet (40 mg total) by mouth daily. 05/07/22 06/06/22  Alford Highland, MD  zinc sulfate 220 (50 Zn) MG capsule Take 1 capsule (220 mg total) by mouth daily. 10/16/22   Arnetha Courser, MD    Social History   Socioeconomic History   Marital status: Married    Spouse name: Not on file   Number of children:  Not on file   Years of education: Not on file   Highest education level: Not on file  Occupational History   Not on file  Tobacco Use   Smoking status: Every Day    Current packs/day: 0.00    Types: Cigarettes    Last attempt to quit: 02/19/2019    Years since quitting: 3.6   Smokeless tobacco: Never  Vaping Use   Vaping status: Never Used  Substance and Sexual Activity   Alcohol use: Never   Drug use: Never   Sexual activity: Yes  Other Topics Concern   Not on file  Social History Narrative   Not on file   Social Determinants of Health   Financial Resource Strain: Low Risk  (08/23/2022)   Overall Financial Resource Strain (CARDIA)    Difficulty of Paying Living Expenses: Not very hard  Food Insecurity: Food Insecurity Present (10/15/2022)   Hunger Vital Sign    Worried About Running Out of Food in the Last Year: Often true    Ran Out of Food in the Last Year: Often true  Transportation Needs: Unmet Transportation Needs (10/15/2022)   PRAPARE - Transportation    Lack of Transportation (Medical): Yes    Lack of Transportation (Non-Medical): Yes  Physical Activity: Inactive (08/23/2022)   Exercise Vital Sign    Days of Exercise per Week: 0 days    Minutes of Exercise per Session: 0 min  Stress: No Stress Concern Present (08/23/2022)   [employer] of Occupational Health - Occupational Stress Questionnaire    Feeling of Stress : Not at all  Social Connections: Moderately Integrated (08/23/2022)   Social Connection and Isolation Panel [NHANES]    Frequency of Communication with Friends and Family: More than three times a week    Frequency of Social Gatherings with Friends and Family: Never    Attends Religious Services: More than 4 times per year    Active Member of [employer] or Organizations: No    Attends Banker Meetings: Never    Marital Status: Married  Catering manager Violence: Not At Risk (10/15/2022)   Humiliation, Afraid, Rape, and Kick questionnaire     Fear of Current or Ex-Partner: No    Emotionally Abused: No    Physically Abused: No    Sexually Abused: No     Family History  Problem Relation Age of Onset   Heart failure Mother     ROS: Otherwise negative unless mentioned in HPI  Physical Examination  Vitals:   10/22/22 0246 10/22/22 0520  BP: (!) 182/116 (!) 160/107  Pulse: 70 66  Resp: 18  18  Temp: 98.1 F (36.7 C) 98.1 F (36.7 C)  SpO2: 100% 100%   Body mass index is 24.27 kg/m.  General:  WDWN in NAD Gait: Not observed HENT: WNL, normocephalic Pulmonary: normal non-labored breathing, without Rales, rhonchi,  wheezing Cardiac: regular, without  Murmurs, rubs or gallops; without carotid bruits Abdomen: Positive Bowel Sounds, soft, NT/ND, no masses Skin: without rashes Vascular Exam/Pulses: Palpable pulses throughout Extremities: without ischemic changes, without Gangrene , without cellulitis; without open wounds;  Musculoskeletal: no muscle wasting or atrophy  Neurologic: A&O X 3;  No focal weakness or paresthesias are detected; speech is fluent/normal Psychiatric:  The pt has Normal affect. Lymph:  Unremarkable  CBC    Component Value Date/Time   WBC 10.1 10/21/2022 2147   RBC 3.62 (L) 10/21/2022 2147   HGB 10.3 (L) 10/21/2022 2147   HGB 9.1 (L) 01/04/2020 1540   HCT 34.6 (L) 10/21/2022 2147   HCT 29.6 (L) 01/04/2020 1540   PLT 299 10/21/2022 2147   PLT 191 01/04/2020 1540   MCV 95.6 10/21/2022 2147   MCV 94 01/04/2020 1540   MCH 28.5 10/21/2022 2147   MCHC 29.8 (L) 10/21/2022 2147   RDW 17.2 (H) 10/21/2022 2147   RDW 16.2 (H) 01/04/2020 1540   LYMPHSABS 1.2 10/14/2022 2229   LYMPHSABS 1.0 01/04/2020 1540   MONOABS 0.9 10/14/2022 2229   EOSABS 0.3 10/14/2022 2229   EOSABS 0.5 (H) 03/27/2018 1643   BASOSABS 0.0 10/14/2022 2229   BASOSABS 0.0 03/27/2018 1643    BMET    Component Value Date/Time   NA 140 10/21/2022 2147   NA 141 06/25/2022 1414   K 4.8 10/21/2022 2147   CL 103 10/21/2022  2147   CO2 18 (L) 10/21/2022 2147   GLUCOSE 92 10/21/2022 2147   BUN 71 (H) 10/21/2022 2147   BUN 39 (H) 06/25/2022 1414   CREATININE 9.19 (H) 10/21/2022 2147   CREATININE 4.62 (H) 01/10/2019 1351   CALCIUM 6.7 (L) 10/21/2022 2147   GFRNONAA 6 (L) 10/21/2022 2147   GFRAA 15 (L) 01/04/2020 1542    COAGS: Lab Results  Component Value Date   INR 1.1 10/15/2022   INR 1.1 09/14/2022   INR 1.1 09/06/2022     Non-Invasive Vascular Imaging:   EXAM:10/21/22 CHEST - 2 VIEW   COMPARISON:  10/14/2022   FINDINGS: Frontal and lateral views of the chest demonstrates stable right internal jugular dialysis catheter. The cardiac silhouette is unremarkable. No airspace disease, effusion, or pneumothorax. No acute bony abnormalities.   IMPRESSION: 1. No acute intrathoracic process.  Statin:  No. Beta Blocker:  Yes.   Aspirin:  Yes.   ACEI:  No. ARB:  No. CCB use:  Yes Other antiplatelets/anticoagulants:  Yes.   Eliquis 5 mg twice daily   ASSESSMENT/PLAN: This is a 67 y.o. male who presents to [facility] emergency department today due to a non functional dialysis permcatheter. Patients last dialysis was last Saturday. Vascular surgery consulted for temp catheter placement.   Vascular Surgery plans on taking the patient to the vascular lab today for catheter exchange. I discussed in detail the procedure, benefits, risks and complications. Patient verbalized his understanding and would like to proceed. I answered all of the patients question today. Patient has been NPO for the procedure later today.    -I discussed the plan in detail with Dr Vilinda Flake MD and he agrees with the plan.    Marcie [facility] 10/22/2022 7:24 AM

## 2022-10-22 NOTE — Discharge Instructions (Signed)
[facility] will call you with follow up appointment on Tuesday. If you don't hear from them by Wednesday please call them at [phone].

## 2022-10-22 NOTE — Op Note (Signed)
OPERATIVE NOTE   PROCEDURE: Insertion of tunneled dialysis catheter right IJ approach same venous access.  PRE-OPERATIVE DIAGNOSIS: Nonfunction of existing tunneled dialysis catheter, and stage renal disease requiring hemodialysis   POST-OPERATIVE DIAGNOSIS: Same   SURGEON: [name]  ANESTHESIA: Conscious sedation was administered under my direct supervision by the interventional radiology RN.  IV Versed plus fentanyl were utilized. Continuous ECG, pulse oximetry and blood pressure was monitored throughout the entire procedure.  Conscious sedation was for a total of 25 minutes.  ESTIMATED BLOOD LOSS: Minimal cc  CONTRAST USED:  None  FLUOROSCOPY TIME: 1.1 minutes  INDICATIONS:   Anthony Hess is a 67 y.o.y.o. male who presents with poor flow and nonfunction of the tunneled dialysis catheter.  Adequate dialysis has not been possible.  DESCRIPTION: After obtaining full informed written consent, the patient was positioned supine. The right neck and chest wall was prepped and draped in a sterile fashion. The cuff is localized and using blunt and sharp dissection it is freed from the surrounding adhesions.  The existing catheter is then transected proximal to the cuff.  The guidewire is advanced without difficulty under fluoroscopy.  Dilators are passed over the wire as needed and the tunneled dialysis catheter is fed into the central venous system without difficulty.  Under fluoroscopy the catheter tip positioned at the atrial caval junction.  Both lumens aspirate and flush easily. After verification of smooth contour with proper tip position under fluoroscopy the catheter is packed with 5000 units of heparin per lumen.  Catheter secured to the skin of the right chest wall with 0 silk. A sterile dressing is applied with a Biopatch.  COMPLICATIONS: None  CONDITION: Good  [name] [facility] Office:  [phone]   10/22/2022,3:46 PM

## 2022-10-22 NOTE — ED Provider Notes (Signed)
[facility] Provider Note    Event Date/Time   First MD Initiated Contact with Patient 10/22/22 [phone]     (approximate)   History   Vascular Access Problem   HPI  Anthony Hess is a 67 y.o. male referred to the ED by vascular surgery office for clogged permacath.  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  States he was last fully dialyzed last Saturday.  Had 1 hour out of his usual 4 hours of dialysis on Tuesday because his access became clogged.  Dialysis unable to access his catheter yesterday.  They scheduled him for an appointment with a vascular surgeon in [place] but that is not until next week.  There was a telephone communication from patient's vascular surgery office yesterday which gave him the options of either following up in [place] as scheduled or coming to the ED.  Patient denies fever/chills, chest pain, shortness of breath, abdominal pain, nausea, vomiting or dizziness.     Past Medical History   Past Medical History:  Diagnosis Date   Aortic atherosclerosis (HCC)    Bilateral carotid artery disease (HCC)    Bladder cancer (HCC)    Coronary artery disease 12/20/2018   a.) LHC 12/20/2018: 50% OM1, 40% OM2, 95% o-pLAD, 75% o=pLCx, 40% mLM, 70% D1, 60% mRCA-1, 50% mRCA-2; refer to CVTS. b.) 4v CABG at [facility] on 12/27/2018: LIMA-LAD, RIMA-PDA, seg LRA-OM1-D1   DCM (dilated cardiomyopathy) (HCC) 12/05/2018   a.) TTE 12/05/2018: EF 40-45%. b.) TTE 12/28/2019: EF 20-25%.   Dyspnea 10/15/2022   ESRD (end stage renal disease) (HCC)    a.) T-Th-Sat   HFrEF (heart failure with reduced ejection fraction) (HCC) 12/05/2018   a.) TTE 12/05/2018: EF 40-45%; mild LVH; ant/apical/sep HK; mild TR . b.) TTE 12/28/2019: EF 20-25%; mod LVH; mod MR/AR; G1DD.   History of 2019 novel coronavirus disease (COVID-19) 04/08/2021   History of kidney stones    HLD (hyperlipidemia)    Hx of CABG 12/27/2018   Hypertension    Infrarenal abdominal aortic aneurysm (AAA)  without rupture (HCC) 03/05/2021   a.) CT abd/pelvis; measured 3.2 cm.   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   a.) LIMA-LAD, RIMA-PDA, sequential LEFT radial artery to OM1 and D1   Sepsis (HCC) 03/14/2021   Wears glasses      Active Problem List   Patient Active Problem List   Diagnosis Date Noted   Dyspnea 10/15/2022   GERD without esophagitis 10/15/2022   ESRD on hemodialysis (HCC) 09/17/2022   Acute on chronic diastolic CHF (congestive heart failure) (HCC) 09/17/2022   History of pulmonary embolism 09/17/2022   Coronary artery disease 09/17/2022   Essential hypertension 09/17/2022   Acute respiratory failure with hypoxia (HCC) 09/06/2022   Fluid overload 09/06/2022   Hypertensive emergency 09/06/2022   HLD (hyperlipidemia) 09/06/2022   CAD (coronary artery disease) 09/06/2022   Pulmonary embolism (HCC) 09/06/2022   NSTEMI (non-ST elevated myocardial infarction) (HCC) 08/17/2022   (HFpEF) heart failure with preserved ejection fraction (HCC) 08/17/2022   Seasonal allergic rhinitis due to pollen 06/25/2022   Nausea 06/25/2022   Hoarseness of voice 06/25/2022   S/P exploratory laparotomy for perforated duodenum with admission 04/26/22 05/04/2022   Postprocedural intraabdominal abscess 05/04/2022   Acute pulmonary embolism (HCC) 04/19/2022   Dyslipidemia 04/19/2022  End-stage renal disease on hemodialysis (HCC) 04/19/2022   Tobacco dependence 04/19/2022   Chronic systolic heart failure (HCC) 04/05/2022   Hypertensive urgency 11/16/2021   Hyperkalemia 11/16/2021   Chronic back pain 11/16/2021   Hypertension secondary to other renal disorders 10/15/2021   Vitamin B12 deficiency anemia due to intrinsic factor deficiency 06/23/2021   Abdominal pain    SBP (spontaneous bacterial peritonitis)  (HCC) 04/08/2021   COVID-19 04/08/2021   AAA (abdominal aortic aneurysm) (HCC) 03/18/2021   Hypocalcemia    Leukocytosis 03/05/2021   Spontaneous bacterial peritonitis (HCC) 03/04/2021   Incisional hernia, without obstruction or gangrene    Generalized (acute) peritonitis (HCC) 12/10/2020   Iron deficiency anemia, unspecified 05/06/2020   Moderate protein-calorie malnutrition (HCC) 05/06/2020   Other long term (current) drug therapy 05/06/2020   Unspecified jaundice 05/06/2020   PD catheter dysfunction (HCC) 04/13/2020   Bilateral carotid artery stenosis 02/29/2020   Coronary artery disease involving coronary bypass graft of native heart 02/29/2020   Secondary hyperparathyroidism of renal origin (HCC) 01/30/2020   Acute peritonitis (HCC) 01/08/2020   Hyperlipidemia, mixed 01/04/2020   Mass of left side of neck 01/04/2020   Senile purpura (HCC) 01/04/2020   Hydroureteronephrosis    Atrial fibrillation, chronic (HCC) 01/05/2019   End stage renal disease (HCC) 01/05/2019   Anemia in chronic kidney disease (CODE) 01/05/2019   Presence of aortocoronary bypass graft 12/27/2018   Other emphysema (HCC) 04/14/2018   Bilateral hydronephrosis 04/03/2018   Hypertension 03/27/2018   Cigarette smoker 03/20/2018   History of bladder cancer 03/20/2018     Past Surgical History   Past Surgical History:  Procedure Laterality Date   AV FISTULA PLACEMENT Left 07/30/2021   Procedure: INSERTION OF ARTERIOVENOUS (AV) GORE-TEX GRAFT ARM BRACHIAL ARTERY TO AXILLARY VEIN;  Surgeon: Annice Needy, MD;  Location: [facility];  Service: Vascular;  Laterality: Left;   CAPD INSERTION N/A 12/31/2019   Procedure: LAPAROSCOPIC INSERTION CONTINUOUS AMBULATORY PERITONEAL DIALYSIS  (CAPD) CATHETER;  Surgeon: Leafy Ro, MD;  Location: [facility];  Service: General;  Laterality: N/A;   CAPD REMOVAL N/A 04/10/2020   Procedure: LAPAROSCOPIC REVISION OF CONTINUOUS AMBULATORY PERITONEAL DIALYSIS  (CAPD) CATHETER;   Surgeon: Leafy Ro, MD;  Location: [facility];  Service: General;  Laterality: N/A;   CORONARY ARTERY BYPASS GRAFT N/A 12/27/2018   Procedure: CORONARY ARTERY BYPASS GRAFTING (CABG) X 4 ON PUMP USING RIGHT & LEFT INTERNAL MAMMARY ARTERY LEFT RADIAL ARTERY ENDOSCOPICALLY HARVESTED;  Surgeon: Linden Dolin, MD;  Location: [facility];  Service: Open Heart Surgery;  Laterality: N/A;   CYSTOSCOPY W/ RETROGRADES Bilateral 05/15/2019   Procedure: CYSTOSCOPY WITH RETROGRADE PYELOGRAM;  Surgeon: Riki Altes, MD;  Location: [facility];  Service: Urology;  Laterality: Bilateral;   CYSTOSCOPY WITH BIOPSY N/A 05/15/2019   Procedure: CYSTOSCOPY WITH bladder BIOPSY;  Surgeon: Riki Altes, MD;  Location: [facility];  Service: Urology;  Laterality: N/A;   DIALYSIS/PERMA CATHETER INSERTION N/A 12/28/2019   Procedure: DIALYSIS/PERMA CATHETER INSERTION;  Surgeon: Annice Needy, MD;  Location: [facility];  Service: Cardiovascular;  Laterality: N/A;   DIALYSIS/PERMA CATHETER INSERTION N/A 03/18/2021   Procedure: DIALYSIS/PERMA CATHETER INSERTION;  Surgeon: Annice Needy, MD;  Location: [facility];  Service: Cardiovascular;  Laterality: N/A;   DIALYSIS/PERMA CATHETER REMOVAL N/A 06/02/2020   Procedure: DIALYSIS/PERMA CATHETER REMOVAL;  Surgeon: Annice Needy, MD;  Location: [facility];  Service: Cardiovascular;  Laterality: N/A;   EXCHANGE OF A DIALYSIS CATHETER  Right 04/10/2020   Procedure: EXCHANGE OF A DIALYSIS CATHETER;  Surgeon: Leafy Ro, MD;  Location: [facility];  Service: General;  Laterality: Right;   INCISIONAL HERNIA REPAIR  01/20/2021   Procedure: HERNIA REPAIR INCISIONAL;  Surgeon: Henrene Dodge, MD;  Location: [facility];  Service: General;;   IR IMAGE GUIDED DRAINAGE PERCUT CATH  PERITONEAL RETROPERIT  04/07/2020   LAPAROSCOPY N/A 04/16/2021   Procedure: LAPAROSCOPY DIAGNOSTIC;  Surgeon: Evie Crumpler Amabile, DO;  Location: [facility];  Service: General;  Laterality: N/A;   LAPAROTOMY  N/A 04/26/2022   Procedure: EXPLORATORY LAPAROTOMY WITH REPAIR OF DUODENAL ULCER;  Surgeon: Carolan Shiver, MD;  Location: [facility];  Service: General;  Laterality: N/A;   LEFT HEART CATH AND CORONARY ANGIOGRAPHY Left 12/20/2018   Procedure: LEFT HEART CATH AND CORONARY ANGIOGRAPHY;  Surgeon: Marcina Millard, MD;  Location: [facility];  Service: Cardiovascular;  Laterality: Left;   RADIAL ARTERY HARVEST Left 12/27/2018   Procedure: ENDOSCOPIC RADIAL ARTERY HARVEST;  Surgeon: Linden Dolin, MD;  Location: [facility];  Service: Open Heart Surgery;  Laterality: Left;   REMOVAL OF A DIALYSIS CATHETER Left 03/20/2021   Procedure: REMOVAL OF A PD CATHETER;  Surgeon: Annice Needy, MD;  Location: [facility];  Service: Vascular;  Laterality: Left;   REVISION OF ARTERIOVENOUS GORETEX GRAFT Left 09/11/2021   Procedure: Excisionof infected AV graft;  Surgeon: Renford Dills, MD;  Location: [facility];  Service: Vascular;  Laterality: Left;   TEE WITHOUT CARDIOVERSION N/A 12/27/2018   Procedure: TRANSESOPHAGEAL ECHOCARDIOGRAM (TEE);  Surgeon: Linden Dolin, MD;  Location[facility];  Service: Open Heart Surgery;  Laterality: N/A;   TUMOR REMOVAL  2019   Bladder     Home Medications   Prior to Admission medications   Medication Sig Start Date End Date Taking? Authorizing Provider  amLODipine (NORVASC) 2.5 MG tablet Take 1 tablet (2.5 mg total) by mouth daily. 05/07/22   Alford Highland, MD  apixaban (ELIQUIS) 5 MG TABS tablet Take 1 tablet (5 mg total) by mouth 2 (two) times daily. 05/01/22   Baron Hamper, MD  ascorbic acid (VITAMIN C) 250 MG tablet Take 2 tablets (500 mg total) by mouth daily. 10/16/22   Arnetha Courser, MD  aspirin EC 81 MG tablet Take 81 mg by mouth daily. Patient not taking: Reported on 10/15/2022 05/06/20   [provider]  benzonatate (TESSALON PERLES) 100 MG capsule Take 1 capsule (100 mg total) by mouth 3 (three) times daily as needed for cough. 10/15/22 10/15/23   Arnetha Courser, MD  calcium acetate (PHOSLO) 667 MG capsule Take 1 capsule (667 mg total) by mouth 3 (three) times daily with meals. 11/18/21   Sunnie Nielsen, DO  guaiFENesin (MUCINEX) 600 MG 12 hr tablet Take 1 tablet (600 mg total) by mouth 2 (two) times daily. 10/15/22   Arnetha Courser, MD  isosorbide dinitrate (ISORDIL) 10 MG tablet Take 1 tablet (10 mg total) by mouth 3 (three) times daily. 10/15/22   Arnetha Courser, MD  metoprolol succinate (TOPROL-XL) 25 MG 24 hr tablet Take 25 mg by mouth daily. 03/12/22   [provider]  ondansetron (ZOFRAN-ODT) 4 MG disintegrating tablet Take 1 tablet (4 mg total) by mouth every 8 (eight) hours as needed for nausea or vomiting. 06/25/22   Simmons-Robinson, Makiera, MD  pantoprazole (PROTONIX) 40 MG tablet Take 1 tablet (40 mg total) by mouth daily. 05/07/22 06/06/22  Alford Highland, MD  zinc sulfate 220 (50 Zn) MG capsule Take 1  capsule (220 mg total) by mouth daily. 10/16/22   Arnetha Courser, MD     Allergies  Patient has no known allergies.   Family History   Family History  Problem Relation Age of Onset   Heart failure Mother      Physical Exam  Triage Vital Signs: ED Triage Vitals [10/21/22 2129]  Encounter Vitals Group     BP (!) 143/106     Systolic BP Percentile      Diastolic BP Percentile      Pulse Rate 77     Resp 18     Temp 99.6 F (37.6 C)     Temp Source Oral     SpO2 95 %     Weight 137 lb (62.1 kg)     Height 5\' 3"  (1.6 m)     Head Circumference      Peak Flow      Pain Score 0     Pain Loc      Pain Education      Exclude from Growth Chart     Updated Vital Signs: BP (!) 182/116 (BP Location: Left Arm)   Pulse 70   Temp 98.1 F (36.7 C)   Resp 18   Ht 5\' 3"  (1.6 m)   Wt 62.1 kg   SpO2 100%   BMI 24.27 kg/m    General: Sleep, awakened for exam, no distress.  CV:  RRR.  Good peripheral perfusion.  Resp:  Normal effort.  CTAB. Abd:  Nontender.  No distention.  Other:  Right chest permacath  noted.   ED Results / Procedures / Treatments  Labs (all labs ordered are listed, but only abnormal results are displayed) Labs Reviewed  CBC - Abnormal; Notable for the following components:      Result Value   RBC 3.62 (*)    Hemoglobin 10.3 (*)    HCT 34.6 (*)    MCHC 29.8 (*)    RDW 17.2 (*)    All other components within normal limits  COMPREHENSIVE METABOLIC PANEL - Abnormal; Notable for the following components:   CO2 18 (*)    BUN 71 (*)    Creatinine, Ser 9.19 (*)    Calcium 6.7 (*)    Albumin 3.4 (*)    GFR, Estimated 6 (*)    Anion gap 19 (*)    All other components within normal limits     EKG  ED ECG REPORT I, Simrit Gohlke J, the attending physician, personally viewed and interpreted this ECG.   Date: 10/22/2022  EKG Time: 2142  Rate: 80  Rhythm: normal sinus rhythm  Axis: Normal  Intervals: RBBB  ST&T Change: Nonspecific    RADIOLOGY I have independently visualized and interpreted patient's x-ray as well as noted the radiology interpretation:  Chest x-ray: No acute cardiopulmonary process  Official radiology report(s): DG Chest 2 View  Result Date: 10/21/2022 CLINICAL DATA:  End-stage renal disease, missed dialysis treatment EXAM: CHEST - 2 VIEW COMPARISON:  10/14/2022 FINDINGS: Frontal and lateral views of the chest demonstrates stable right internal jugular dialysis catheter. The cardiac silhouette is unremarkable. No airspace disease, effusion, or pneumothorax. No acute bony abnormalities. IMPRESSION: 1. No acute intrathoracic process. Electronically Signed   By: Sharlet Salina M.D.   On: 10/21/2022 22:06     PROCEDURES:  Critical Care performed: No  Procedures   MEDICATIONS ORDERED IN ED: Medications - No data to display   IMPRESSION / MDM / ASSESSMENT AND PLAN / ED  COURSE  I reviewed the triage vital signs and the nursing notes.                             67 year old male presenting with vascular access problem.  Potassium is 4.8,  no florid pulmonary edema on chest x-ray.  Trended anion gap which patient is chronically elevated, likely secondary to uremia.  Patient is not tachypneic nor hypoxic.  Will discuss with vascular surgery.  Patient's presentation is most consistent with acute presentation with potential threat to life or bodily function.    Clinical Course as of 10/22/22 4540  Fri Oct 22, 2022  0502 Spoke with vascular surgery NP on-call Sheppard Plumber who states someone from vascular surgery will evaluate patient later this morning and place him on the schedule for permacath exchange.  Will keep NPO.  [JS]    Clinical Course User Index [JS] Irean Hong, MD     FINAL CLINICAL IMPRESSION(S) / ED DIAGNOSES   Final diagnoses:  Problem with vascular access     Rx / DC Orders   ED Discharge Orders     None        Note:  This document was prepared using Dragon voice recognition software and may include unintentional dictation errors.   Irean Hong, MD 10/22/22 [phone]

## 2022-10-22 NOTE — H&P (View-Only) (Signed)
Hospital Consult    Reason for Consult:  Permacath Exchange  Requesting Physician:  Dr Chiquita Loth MD MRN #:  [id number]  History of Present Illness: This is a 67 y.o. male referred to the ED by vascular surgery office for clogged permacath. 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 States he was last fully dialyzed last Saturday. Had 1 hour out of his usual 4 hours of dialysis on Tuesday because his access became clogged. Dialysis unable to access his catheter yesterday. They scheduled him for an appointment with a vascular surgeon in [place] but that is not until next week. Patient denies fever/chills, chest pain, shortness of breath, abdominal pain, nausea, vomiting or dizziness. Vascular Surgery consulted from the ED for Permcath exchange today.   Past Medical History:  Diagnosis Date   Aortic atherosclerosis (HCC)    Bilateral carotid artery disease (HCC)    Bladder cancer (HCC)    Coronary artery disease 12/20/2018   a.) LHC 12/20/2018: 50% OM1, 40% OM2, 95% o-pLAD, 75% o=pLCx, 40% mLM, 70% D1, 60% mRCA-1, 50% mRCA-2; refer to CVTS. b.) 4v CABG at [facility] on 12/27/2018: LIMA-LAD, RIMA-PDA, seg LRA-OM1-D1   DCM (dilated cardiomyopathy) (HCC) 12/05/2018   a.) TTE 12/05/2018: EF 40-45%. b.) TTE 12/28/2019: EF 20-25%.   Dyspnea 10/15/2022   ESRD (end stage renal disease) (HCC)    a.) T-Th-Sat   HFrEF (heart failure with reduced ejection fraction) (HCC) 12/05/2018   a.) TTE 12/05/2018: EF 40-45%; mild LVH; ant/apical/sep HK; mild TR . b.) TTE 12/28/2019: EF 20-25%; mod LVH; mod MR/AR; G1DD.   History of 2019 novel coronavirus disease (COVID-19) 04/08/2021   History of kidney stones    HLD (hyperlipidemia)    Hx of CABG 12/27/2018   Hypertension    Infrarenal abdominal aortic aneurysm (AAA) without rupture (HCC) 03/05/2021   a.) CT abd/pelvis; measured 3.2 cm.   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   a.) LIMA-LAD, RIMA-PDA, sequential LEFT radial artery to OM1 and D1   Sepsis (HCC) 03/14/2021   Wears glasses     Past Surgical History:  Procedure Laterality Date   AV FISTULA PLACEMENT Left 07/30/2021   Procedure: INSERTION OF ARTERIOVENOUS (AV) GORE-TEX GRAFT ARM BRACHIAL ARTERY TO AXILLARY VEIN;  Surgeon: Annice Needy, MD;  Location: [facility] ORS;  Service: Vascular;  Laterality: Left;   CAPD INSERTION N/A 12/31/2019   Procedure: LAPAROSCOPIC INSERTION CONTINUOUS AMBULATORY PERITONEAL DIALYSIS  (CAPD) CATHETER;  Surgeon: Leafy Ro, MD;  Location: [facility] ORS;  Service: General;  Laterality: N/A;   CAPD REMOVAL N/A 04/10/2020   Procedure: LAPAROSCOPIC REVISION OF CONTINUOUS AMBULATORY PERITONEAL DIALYSIS  (CAPD) CATHETER;  Surgeon: Leafy Ro, MD;  Location: [facility] OR[facility];  Service: General;  Laterality: N/A;   CORONARY ARTERY BYPASS GRAFT N/A 12/27/2018   Procedure: CORONARY ARTERY BYPASS GRAFTING (CABG) X 4 ON PUMP USING RIGHT & LEFT INTERNAL MAMMARY ARTERY LEFT RADIAL ARTERY ENDOSCOPICALLY HARVESTED;  Surgeon: Linden Dolin, MD;  [facility];  Service: Open Heart Surgery;  Laterality: N/A;   CYSTOSCOPY W/ RETROGRADES Bilateral 05/15/2019   Procedure: CYSTOSCOPY WITH RETROGRADE PYELOGRAM;  Surgeon: Riki Altes, MD;  Location: [facility] OR[facility];  Service: Urology;  Laterality: Bilateral;   CYSTOSCOPY WITH BIOPSY N/A 05/15/2019   Procedure: CYSTOSCOPY WITH bladder BIOPSY;  Surgeon: Irineo Axon  C, MD;  Location: [facility];  Service: Urology;  Laterality: N/A;   DIALYSIS/PERMA CATHETER INSERTION N/A 12/28/2019   Procedure: DIALYSIS/PERMA CATHETER INSERTION;  Surgeon: Annice Needy, MD;  Location: [facility];  Service: Cardiovascular;  Laterality: N/A;   DIALYSIS/PERMA CATHETER INSERTION N/A 03/18/2021   Procedure: DIALYSIS/PERMA CATHETER INSERTION;  Surgeon: Annice Needy, MD;   Location: [facility];  Service: Cardiovascular;  Laterality: N/A;   DIALYSIS/PERMA CATHETER REMOVAL N/A 06/02/2020   Procedure: DIALYSIS/PERMA CATHETER REMOVAL;  Surgeon: Annice Needy, MD;  Location: [facility];  Service: Cardiovascular;  Laterality: N/A;   EXCHANGE OF A DIALYSIS CATHETER Right 04/10/2020   Procedure: EXCHANGE OF A DIALYSIS CATHETER;  Surgeon: Leafy Ro, MD;  Location: [facility];  Service: General;  Laterality: Right;   INCISIONAL HERNIA REPAIR  01/20/2021   Procedure: HERNIA REPAIR INCISIONAL;  Surgeon: Henrene Dodge, MD;  Location: [facility];  Service: General;;   IR IMAGE GUIDED DRAINAGE PERCUT CATH  PERITONEAL RETROPERIT  04/07/2020   LAPAROSCOPY N/A 04/16/2021   Procedure: LAPAROSCOPY DIAGNOSTIC;  Surgeon: Sung Amabile, DO;  Location: [facility];  Service: General;  Laterality: N/A;   LAPAROTOMY N/A 04/26/2022   Procedure: EXPLORATORY LAPAROTOMY WITH REPAIR OF DUODENAL ULCER;  Surgeon: Carolan Shiver, MD;  Location: [facility];  Service: General;  Laterality: N/A;   LEFT HEART CATH AND CORONARY ANGIOGRAPHY Left 12/20/2018   Procedure: LEFT HEART CATH AND CORONARY ANGIOGRAPHY;  Surgeon: Marcina Millard, MD;  Location: [facility];  Service: Cardiovascular;  Laterality: Left;   RADIAL ARTERY HARVEST Left 12/27/2018   Procedure: ENDOSCOPIC RADIAL ARTERY HARVEST;  Surgeon: Linden Dolin, MD;  [facility];  Service: Open Heart Surgery;  Laterality: Left;   REMOVAL OF A DIALYSIS CATHETER Left 03/20/2021   Procedure: REMOVAL OF A PD CATHETER;  Surgeon: Annice Needy, MD;  Location: [facility];  Service: Vascular;  Laterality: Left;   REVISION OF ARTERIOVENOUS GORETEX GRAFT Left 09/11/2021   Procedure: Excisionof infected AV graft;  Surgeon: Renford Dills, MD;  Location: [facility];  Service: Vascular;  Laterality: Left;   TEE WITHOUT CARDIOVERSION N/A 12/27/2018   Procedure: TRANSESOPHAGEAL ECHOCARDIOGRAM (TEE);  Surgeon: Linden Dolin,  MD;  [facility];  Service: Open Heart Surgery;  Laterality: N/A;   TUMOR REMOVAL  2019   Bladder    No Known Allergies  Prior to Admission medications   Medication Sig Start Date End Date Taking? Authorizing Provider  amLODipine (NORVASC) 2.5 MG tablet Take 1 tablet (2.5 mg total) by mouth daily. 05/07/22   Alford Highland, MD  apixaban (ELIQUIS) 5 MG TABS tablet Take 1 tablet (5 mg total) by mouth 2 (two) times daily. 05/01/22   Baron Hamper, MD  ascorbic acid (VITAMIN C) 250 MG tablet Take 2 tablets (500 mg total) by mouth daily. 10/16/22   Arnetha Courser, MD  aspirin EC 81 MG tablet Take 81 mg by mouth daily. Patient not taking: Reported on 10/15/2022 05/06/20   [provider]  benzonatate (TESSALON PERLES) 100 MG capsule Take 1 capsule (100 mg total) by mouth 3 (three) times daily as needed for cough. 10/15/22 10/15/23  Arnetha Courser, MD  calcium acetate (PHOSLO) 667 MG capsule Take 1 capsule (667 mg total) by mouth 3 (three) times daily with meals. 11/18/21   Sunnie Nielsen, DO  guaiFENesin (MUCINEX) 600 MG 12 hr tablet Take 1 tablet (600 mg total) by mouth 2 (two) times daily. 10/15/22  Arnetha Courser, MD  isosorbide dinitrate (ISORDIL) 10 MG tablet Take 1 tablet (10 mg total) by mouth 3 (three) times daily. 10/15/22   Arnetha Courser, MD  metoprolol succinate (TOPROL-XL) 25 MG 24 hr tablet Take 25 mg by mouth daily. 03/12/22   [provider]  ondansetron (ZOFRAN-ODT) 4 MG disintegrating tablet Take 1 tablet (4 mg total) by mouth every 8 (eight) hours as needed for nausea or vomiting. 06/25/22   Simmons-Robinson, Makiera, MD  pantoprazole (PROTONIX) 40 MG tablet Take 1 tablet (40 mg total) by mouth daily. 05/07/22 06/06/22  Alford Highland, MD  zinc sulfate 220 (50 Zn) MG capsule Take 1 capsule (220 mg total) by mouth daily. 10/16/22   Arnetha Courser, MD    Social History   Socioeconomic History   Marital status: Married    Spouse name: Not on file   Number of children:  Not on file   Years of education: Not on file   Highest education level: Not on file  Occupational History   Not on file  Tobacco Use   Smoking status: Every Day    Current packs/day: 0.00    Types: Cigarettes    Last attempt to quit: 02/19/2019    Years since quitting: 3.6   Smokeless tobacco: Never  Vaping Use   Vaping status: Never Used  Substance and Sexual Activity   Alcohol use: Never   Drug use: Never   Sexual activity: Yes  Other Topics Concern   Not on file  Social History Narrative   Not on file   Social Determinants of Health   Financial Resource Strain: Low Risk  (08/23/2022)   Overall Financial Resource Strain (CARDIA)    Difficulty of Paying Living Expenses: Not very hard  Food Insecurity: Food Insecurity Present (10/15/2022)   Hunger Vital Sign    Worried About Running Out of Food in the Last Year: Often true    Ran Out of Food in the Last Year: Often true  Transportation Needs: Unmet Transportation Needs (10/15/2022)   PRAPARE - Transportation    Lack of Transportation (Medical): Yes    Lack of Transportation (Non-Medical): Yes  Physical Activity: Inactive (08/23/2022)   Exercise Vital Sign    Days of Exercise per Week: 0 days    Minutes of Exercise per Session: 0 min  Stress: No Stress Concern Present (08/23/2022)   [employer] of Occupational Health - Occupational Stress Questionnaire    Feeling of Stress : Not at all  Social Connections: Moderately Integrated (08/23/2022)   Social Connection and Isolation Panel [NHANES]    Frequency of Communication with Friends and Family: More than three times a week    Frequency of Social Gatherings with Friends and Family: Never    Attends Religious Services: More than 4 times per year    Active Member of [employer] or Organizations: No    Attends Banker Meetings: Never    Marital Status: Married  Catering manager Violence: Not At Risk (10/15/2022)   Humiliation, Afraid, Rape, and Kick questionnaire     Fear of Current or Ex-Partner: No    Emotionally Abused: No    Physically Abused: No    Sexually Abused: No     Family History  Problem Relation Age of Onset   Heart failure Mother     ROS: Otherwise negative unless mentioned in HPI  Physical Examination  Vitals:   10/22/22 0246 10/22/22 0520  BP: (!) 182/116 (!) 160/107  Pulse: 70 66  Resp: 18  18  Temp: 98.1 F (36.7 C) 98.1 F (36.7 C)  SpO2: 100% 100%   Body mass index is 24.27 kg/m.  General:  WDWN in NAD Gait: Not observed HENT: WNL, normocephalic Pulmonary: normal non-labored breathing, without Rales, rhonchi,  wheezing Cardiac: regular, without  Murmurs, rubs or gallops; without carotid bruits Abdomen: Positive Bowel Sounds, soft, NT/ND, no masses Skin: without rashes Vascular Exam/Pulses: Palpable pulses throughout Extremities: without ischemic changes, without Gangrene , without cellulitis; without open wounds;  Musculoskeletal: no muscle wasting or atrophy  Neurologic: A&O X 3;  No focal weakness or paresthesias are detected; speech is fluent/normal Psychiatric:  The pt has Normal affect. Lymph:  Unremarkable  CBC    Component Value Date/Time   WBC 10.1 10/21/2022 2147   RBC 3.62 (L) 10/21/2022 2147   HGB 10.3 (L) 10/21/2022 2147   HGB 9.1 (L) 01/04/2020 1540   HCT 34.6 (L) 10/21/2022 2147   HCT 29.6 (L) 01/04/2020 1540   PLT 299 10/21/2022 2147   PLT 191 01/04/2020 1540   MCV 95.6 10/21/2022 2147   MCV 94 01/04/2020 1540   MCH 28.5 10/21/2022 2147   MCHC 29.8 (L) 10/21/2022 2147   RDW 17.2 (H) 10/21/2022 2147   RDW 16.2 (H) 01/04/2020 1540   LYMPHSABS 1.2 10/14/2022 2229   LYMPHSABS 1.0 01/04/2020 1540   MONOABS 0.9 10/14/2022 2229   EOSABS 0.3 10/14/2022 2229   EOSABS 0.5 (H) 03/27/2018 1643   BASOSABS 0.0 10/14/2022 2229   BASOSABS 0.0 03/27/2018 1643    BMET    Component Value Date/Time   NA 140 10/21/2022 2147   NA 141 06/25/2022 1414   K 4.8 10/21/2022 2147   CL 103 10/21/2022  2147   CO2 18 (L) 10/21/2022 2147   GLUCOSE 92 10/21/2022 2147   BUN 71 (H) 10/21/2022 2147   BUN 39 (H) 06/25/2022 1414   CREATININE 9.19 (H) 10/21/2022 2147   CREATININE 4.62 (H) 01/10/2019 1351   CALCIUM 6.7 (L) 10/21/2022 2147   GFRNONAA 6 (L) 10/21/2022 2147   GFRAA 15 (L) 01/04/2020 1542    COAGS: Lab Results  Component Value Date   INR 1.1 10/15/2022   INR 1.1 09/14/2022   INR 1.1 09/06/2022     Non-Invasive Vascular Imaging:   EXAM:10/21/22 CHEST - 2 VIEW   COMPARISON:  10/14/2022   FINDINGS: Frontal and lateral views of the chest demonstrates stable right internal jugular dialysis catheter. The cardiac silhouette is unremarkable. No airspace disease, effusion, or pneumothorax. No acute bony abnormalities.   IMPRESSION: 1. No acute intrathoracic process.  Statin:  No. Beta Blocker:  Yes.   Aspirin:  Yes.   ACEI:  No. ARB:  No. CCB use:  Yes Other antiplatelets/anticoagulants:  Yes.   Eliquis 5 mg twice daily   ASSESSMENT/PLAN: This is a 67 y.o. male who presents to [facility] emergency department today due to a non functional dialysis permcatheter. Patients last dialysis was last Saturday. Vascular surgery consulted for temp catheter placement.   Vascular Surgery plans on taking the patient to the vascular lab today for catheter exchange. I discussed in detail the procedure, benefits, risks and complications. Patient verbalized his understanding and would like to proceed. I answered all of the patients question today. Patient has been NPO for the procedure later today.    -I discussed the plan in detail with Dr Vilinda Flake MD and he agrees with the plan.    Marcie Bal [facility] 10/22/2022 7:24 AM

## 2022-10-22 NOTE — Interval H&P Note (Signed)
History and Physical Interval Note:  10/22/2022 2:32 PM  Anthony Hess  has presented today for surgery, with the diagnosis of ESRD.  The various methods of treatment have been discussed with the patient and family. After consideration of risks, benefits and other options for treatment, the patient has consented to  Procedure(s): DIALYSIS/PERMA CATHETER REPAIR (N/A) as a surgical intervention.  The patient's history has been reviewed, patient examined, no change in status, stable for surgery.  I have reviewed the patient's chart and labs.  Questions were answered to the patient's satisfaction.     [name]

## 2022-10-26 ENCOUNTER — Encounter: Payer: Self-pay | Admitting: Vascular Surgery

## 2022-11-16 ENCOUNTER — Inpatient Hospital Stay
Admission: EM | Admit: 2022-11-16 | Discharge: 2022-11-17 | DRG: 189 | Disposition: A | Discharge status: Home / Self Care | Payer: Medicare Other | Attending: Internal Medicine | Admitting: Internal Medicine

## 2022-11-16 ENCOUNTER — Other Ambulatory Visit: Payer: Self-pay

## 2022-11-16 ENCOUNTER — Emergency Department: Payer: Medicare Other

## 2022-11-16 DIAGNOSIS — R0603 Acute respiratory distress: Principal | ICD-10-CM

## 2022-11-16 DIAGNOSIS — I2510 Atherosclerotic heart disease of native coronary artery without angina pectoris: Secondary | ICD-10-CM | POA: Diagnosis present

## 2022-11-16 DIAGNOSIS — Z7189 Other specified counseling: Secondary | ICD-10-CM | POA: Diagnosis not present

## 2022-11-16 DIAGNOSIS — I169 Hypertensive crisis, unspecified: Secondary | ICD-10-CM

## 2022-11-16 DIAGNOSIS — I480 Paroxysmal atrial fibrillation: Secondary | ICD-10-CM

## 2022-11-16 DIAGNOSIS — Z7901 Long term (current) use of anticoagulants: Secondary | ICD-10-CM

## 2022-11-16 DIAGNOSIS — Z992 Dependence on renal dialysis: Secondary | ICD-10-CM

## 2022-11-16 DIAGNOSIS — E785 Hyperlipidemia, unspecified: Secondary | ICD-10-CM | POA: Diagnosis present

## 2022-11-16 DIAGNOSIS — I10 Essential (primary) hypertension: Secondary | ICD-10-CM | POA: Diagnosis present

## 2022-11-16 DIAGNOSIS — I444 Left anterior fascicular block: Secondary | ICD-10-CM | POA: Diagnosis present

## 2022-11-16 DIAGNOSIS — N2581 Secondary hyperparathyroidism of renal origin: Secondary | ICD-10-CM | POA: Diagnosis present

## 2022-11-16 DIAGNOSIS — F17210 Nicotine dependence, cigarettes, uncomplicated: Secondary | ICD-10-CM | POA: Diagnosis present

## 2022-11-16 DIAGNOSIS — I739 Peripheral vascular disease, unspecified: Secondary | ICD-10-CM | POA: Diagnosis present

## 2022-11-16 DIAGNOSIS — E872 Acidosis, unspecified: Secondary | ICD-10-CM | POA: Diagnosis present

## 2022-11-16 DIAGNOSIS — J449 Chronic obstructive pulmonary disease, unspecified: Secondary | ICD-10-CM

## 2022-11-16 DIAGNOSIS — Z91199 Patient's noncompliance with other medical treatment and regimen due to unspecified reason: Secondary | ICD-10-CM

## 2022-11-16 DIAGNOSIS — J9601 Acute respiratory failure with hypoxia: Principal | ICD-10-CM | POA: Diagnosis present

## 2022-11-16 DIAGNOSIS — Z515 Encounter for palliative care: Secondary | ICD-10-CM

## 2022-11-16 DIAGNOSIS — Z8249 Family history of ischemic heart disease and other diseases of the circulatory system: Secondary | ICD-10-CM | POA: Diagnosis not present

## 2022-11-16 DIAGNOSIS — I420 Dilated cardiomyopathy: Secondary | ICD-10-CM | POA: Diagnosis present

## 2022-11-16 DIAGNOSIS — Z8551 Personal history of malignant neoplasm of bladder: Secondary | ICD-10-CM | POA: Diagnosis not present

## 2022-11-16 DIAGNOSIS — I132 Hypertensive heart and chronic kidney disease with heart failure and with stage 5 chronic kidney disease, or end stage renal disease: Secondary | ICD-10-CM | POA: Diagnosis present

## 2022-11-16 DIAGNOSIS — Z8711 Personal history of peptic ulcer disease: Secondary | ICD-10-CM

## 2022-11-16 DIAGNOSIS — J441 Chronic obstructive pulmonary disease with (acute) exacerbation: Secondary | ICD-10-CM | POA: Diagnosis present

## 2022-11-16 DIAGNOSIS — N186 End stage renal disease: Secondary | ICD-10-CM | POA: Diagnosis present

## 2022-11-16 DIAGNOSIS — Z8616 Personal history of COVID-19: Secondary | ICD-10-CM | POA: Diagnosis not present

## 2022-11-16 DIAGNOSIS — Z86711 Personal history of pulmonary embolism: Secondary | ICD-10-CM

## 2022-11-16 DIAGNOSIS — I714 Abdominal aortic aneurysm, without rupture, unspecified: Secondary | ICD-10-CM | POA: Diagnosis present

## 2022-11-16 DIAGNOSIS — Z79899 Other long term (current) drug therapy: Secondary | ICD-10-CM

## 2022-11-16 DIAGNOSIS — Z7982 Long term (current) use of aspirin: Secondary | ICD-10-CM

## 2022-11-16 DIAGNOSIS — Z91148 Patient's other noncompliance with medication regimen for other reason: Secondary | ICD-10-CM

## 2022-11-16 DIAGNOSIS — I700 Atherosclerosis of aorta: Secondary | ICD-10-CM | POA: Diagnosis present

## 2022-11-16 DIAGNOSIS — I5043 Acute on chronic combined systolic (congestive) and diastolic (congestive) heart failure: Secondary | ICD-10-CM | POA: Diagnosis present

## 2022-11-16 DIAGNOSIS — K219 Gastro-esophageal reflux disease without esophagitis: Secondary | ICD-10-CM | POA: Diagnosis present

## 2022-11-16 DIAGNOSIS — I252 Old myocardial infarction: Secondary | ICD-10-CM

## 2022-11-16 DIAGNOSIS — Z5941 Food insecurity: Secondary | ICD-10-CM

## 2022-11-16 DIAGNOSIS — Z87442 Personal history of urinary calculi: Secondary | ICD-10-CM

## 2022-11-16 DIAGNOSIS — D72829 Elevated white blood cell count, unspecified: Secondary | ICD-10-CM | POA: Diagnosis present

## 2022-11-16 DIAGNOSIS — I161 Hypertensive emergency: Secondary | ICD-10-CM | POA: Diagnosis present

## 2022-11-16 DIAGNOSIS — D631 Anemia in chronic kidney disease: Secondary | ICD-10-CM | POA: Diagnosis present

## 2022-11-16 DIAGNOSIS — E875 Hyperkalemia: Secondary | ICD-10-CM | POA: Diagnosis present

## 2022-11-16 DIAGNOSIS — Z5982 Transportation insecurity: Secondary | ICD-10-CM

## 2022-11-16 DIAGNOSIS — Z951 Presence of aortocoronary bypass graft: Secondary | ICD-10-CM

## 2022-11-16 DIAGNOSIS — E8770 Fluid overload, unspecified: Secondary | ICD-10-CM | POA: Diagnosis not present

## 2022-11-16 LAB — BASIC METABOLIC PANEL
Anion gap: 20 — ABNORMAL HIGH (ref 5–15)
Anion gap: 22 — ABNORMAL HIGH (ref 5–15)
BUN: 91 mg/dL — ABNORMAL HIGH (ref 8–23)
BUN: 91 mg/dL — ABNORMAL HIGH (ref 8–23)
CO2: 16 mmol/L — ABNORMAL LOW (ref 22–32)
CO2: 11 mmol/L — ABNORMAL LOW (ref 22–32)
Calcium: 5.8 mg/dL — CL (ref 8.9–10.3)
Calcium: 5.6 mg/dL — CL (ref 8.9–10.3)
Chloride: 102 mmol/L (ref 98–111)
Chloride: 105 mmol/L (ref 98–111)
Creatinine, Ser: 9.45 mg/dL — ABNORMAL HIGH (ref 0.61–1.24)
Creatinine, Ser: 9.53 mg/dL — ABNORMAL HIGH (ref 0.61–1.24)
GFR, Estimated: 6 mL/min — ABNORMAL LOW (ref >60)
GFR, Estimated: 6 mL/min — ABNORMAL LOW (ref >60)
Glucose, Bld: 98 mg/dL (ref 70–99)
Glucose, Bld: 159 mg/dL — ABNORMAL HIGH (ref 70–99)
Potassium: 5.9 mmol/L — ABNORMAL HIGH (ref 3.5–5.1)
Potassium: 5.9 mmol/L — ABNORMAL HIGH (ref 3.5–5.1)
Sodium: 138 mmol/L (ref 135–145)
Sodium: 138 mmol/L (ref 135–145)

## 2022-11-16 LAB — CBC
HCT: 32.4 % — ABNORMAL LOW (ref 39.0–52.0)
Hemoglobin: 10.2 g/dL — ABNORMAL LOW (ref 13.0–17.0)
MCH: 29.6 pg (ref 26.0–34.0)
MCHC: 31.5 g/dL (ref 30.0–36.0)
MCV: 93.9 fL (ref 80.0–100.0)
Platelets: 289 10*3/uL (ref 150–400)
RBC: 3.45 MIL/uL — ABNORMAL LOW (ref 4.22–5.81)
RDW: 18.1 % — ABNORMAL HIGH (ref 11.5–15.5)
WBC: 12 10*3/uL — ABNORMAL HIGH (ref 4.0–10.5)
nRBC: 0 % (ref 0.0–0.2)

## 2022-11-16 LAB — BLOOD GAS, VENOUS
Acid-base deficit: 12.4 mmol/L — ABNORMAL HIGH (ref 0.0–2.0)
Bicarbonate: 15.7 mmol/L — ABNORMAL LOW (ref 20.0–28.0)
O2 Saturation: 47.6 %
Patient temperature: 37
pCO2, Ven: 43 mmHg — ABNORMAL LOW (ref 44–60)
pH, Ven: 7.17 — CL (ref 7.25–7.43)
pO2, Ven: 39 mmHg (ref 32–45)

## 2022-11-16 LAB — BRAIN NATRIURETIC PEPTIDE: B Natriuretic Peptide: >4500 pg/mL — ABNORMAL HIGH (ref 0.0–100.0)

## 2022-11-16 LAB — LACTIC ACID, PLASMA
Lactic Acid, Venous: 1.1 mmol/L (ref 0.5–1.9)
Lactic Acid, Venous: 0.9 mmol/L (ref 0.5–1.9)

## 2022-11-16 LAB — CBC WITH DIFFERENTIAL/PLATELET
Abs Immature Granulocytes: 0.05 10*3/uL (ref 0.00–0.07)
Basophils Absolute: 0.1 10*3/uL (ref 0.0–0.1)
Basophils Relative: 1 %
Eosinophils Absolute: 0.6 10*3/uL — ABNORMAL HIGH (ref 0.0–0.5)
Eosinophils Relative: 5 %
HCT: 36.9 % — ABNORMAL LOW (ref 39.0–52.0)
Hemoglobin: 11.2 g/dL — ABNORMAL LOW (ref 13.0–17.0)
Immature Granulocytes: 0 %
Lymphocytes Relative: 14 %
Lymphs Abs: 1.8 10*3/uL (ref 0.7–4.0)
MCH: 29.6 pg (ref 26.0–34.0)
MCHC: 30.4 g/dL (ref 30.0–36.0)
MCV: 97.4 fL (ref 80.0–100.0)
Monocytes Absolute: 1.3 10*3/uL — ABNORMAL HIGH (ref 0.1–1.0)
Monocytes Relative: 10 %
Neutro Abs: 9 10*3/uL — ABNORMAL HIGH (ref 1.7–7.7)
Neutrophils Relative %: 70 %
Platelets: 307 10*3/uL (ref 150–400)
RBC: 3.79 MIL/uL — ABNORMAL LOW (ref 4.22–5.81)
RDW: 18.1 % — ABNORMAL HIGH (ref 11.5–15.5)
WBC: 12.8 10*3/uL — ABNORMAL HIGH (ref 4.0–10.5)
nRBC: 0.2 % (ref 0.0–0.2)

## 2022-11-16 MED ORDER — GUAIFENESIN ER 600 MG PO TB12
600.0000 mg | ORAL_TABLET | Freq: Two times a day (BID) | ORAL | Status: DC
  Administered 2022-11-16 – 2022-11-17 (×3): 600 mg via ORAL
  Filled 2022-11-16 (×3): qty 1

## 2022-11-16 MED ORDER — LIDOCAINE-PRILOCAINE 2.5-2.5 % EX CREA: 1.0000 | TOPICAL_CREAM | CUTANEOUS | Status: DC | PRN

## 2022-11-16 MED ORDER — FUROSEMIDE 10 MG/ML IJ SOLN
80.0000 mg | Freq: Two times a day (BID) | INTRAMUSCULAR | Status: DC
  Administered 2022-11-16 – 2022-11-17 (×3): 80 mg via INTRAVENOUS
  Filled 2022-11-16 (×3): qty 8

## 2022-11-16 MED ORDER — IPRATROPIUM-ALBUTEROL 0.5-2.5 (3) MG/3ML IN SOLN
9.0000 mL | Freq: Once | RESPIRATORY_TRACT | Status: AC
  Administered 2022-11-16: 9 mL via RESPIRATORY_TRACT
  Filled 2022-11-16: qty 3

## 2022-11-16 MED ORDER — IPRATROPIUM-ALBUTEROL 0.5-2.5 (3) MG/3ML IN SOLN
3.0000 mL | Freq: Four times a day (QID) | RESPIRATORY_TRACT | Status: DC
  Administered 2022-11-16 – 2022-11-17 (×3): 3 mL via RESPIRATORY_TRACT
  Filled 2022-11-16 (×3): qty 3

## 2022-11-16 MED ORDER — ZINC SULFATE 220 (50 ZN) MG PO CAPS
220.0000 mg | ORAL_CAPSULE | Freq: Every day | ORAL | Status: DC
  Administered 2022-11-16 – 2022-11-17 (×2): 220 mg via ORAL
  Filled 2022-11-16 (×2): qty 1

## 2022-11-16 MED ORDER — APIXABAN 5 MG PO TABS
5.0000 mg | ORAL_TABLET | Freq: Two times a day (BID) | ORAL | Status: DC
  Administered 2022-11-16 – 2022-11-17 (×3): 5 mg via ORAL
  Filled 2022-11-16 (×3): qty 1

## 2022-11-16 MED ORDER — CALCIUM ACETATE (PHOS BINDER) 667 MG PO CAPS
667.0000 mg | ORAL_CAPSULE | Freq: Three times a day (TID) | ORAL | Status: DC
  Administered 2022-11-16 – 2022-11-17 (×3): 667 mg via ORAL
  Filled 2022-11-16 (×6): qty 1

## 2022-11-16 MED ORDER — MAGNESIUM HYDROXIDE 400 MG/5ML PO SUSP: 30.0000 mL | Freq: Every day | ORAL | Status: DC | PRN

## 2022-11-16 MED ORDER — TRAZODONE HCL 50 MG PO TABS
25.0000 mg | ORAL_TABLET | Freq: Every evening | ORAL | Status: DC | PRN
  Administered 2022-11-16: 25 mg via ORAL
  Filled 2022-11-16: qty 1

## 2022-11-16 MED ORDER — NEPRO/CARBSTEADY PO LIQD: 237.0000 mL | ORAL | Status: DC | PRN

## 2022-11-16 MED ORDER — ALTEPLASE 2 MG IJ SOLR: 2.0000 mg | Freq: Once | INTRAMUSCULAR | Status: DC | PRN

## 2022-11-16 MED ORDER — FUROSEMIDE 10 MG/ML IJ SOLN
80.0000 mg | Freq: Once | INTRAMUSCULAR | Status: AC
  Administered 2022-11-16: 80 mg via INTRAVENOUS
  Filled 2022-11-16: qty 8

## 2022-11-16 MED ORDER — PANTOPRAZOLE SODIUM 40 MG PO TBEC
40.0000 mg | DELAYED_RELEASE_TABLET | Freq: Every day | ORAL | Status: DC
  Administered 2022-11-16 – 2022-11-17 (×2): 40 mg via ORAL
  Filled 2022-11-16 (×2): qty 1

## 2022-11-16 MED ORDER — VITAMIN C 500 MG PO TABS
500.0000 mg | ORAL_TABLET | Freq: Every day | ORAL | Status: DC
  Administered 2022-11-16 – 2022-11-17 (×2): 500 mg via ORAL
  Filled 2022-11-16 (×2): qty 1

## 2022-11-16 MED ORDER — SODIUM CHLORIDE 0.9 % IV SOLN: 1.0000 g | INTRAVENOUS | Status: DC

## 2022-11-16 MED ORDER — ONDANSETRON HCL 4 MG PO TABS: 4.0000 mg | ORAL_TABLET | Freq: Four times a day (QID) | ORAL | Status: DC | PRN

## 2022-11-16 MED ORDER — ONDANSETRON 4 MG PO TBDP: 4.0000 mg | ORAL_TABLET | Freq: Three times a day (TID) | ORAL | Status: DC | PRN

## 2022-11-16 MED ORDER — AMLODIPINE BESYLATE 5 MG PO TABS
2.5000 mg | ORAL_TABLET | Freq: Every day | ORAL | Status: DC
  Filled 2022-11-16: qty 1

## 2022-11-16 MED ORDER — ACETAMINOPHEN 650 MG RE SUPP: 650.0000 mg | Freq: Four times a day (QID) | RECTAL | Status: DC | PRN

## 2022-11-16 MED ORDER — METHYLPREDNISOLONE SODIUM SUCC 40 MG IJ SOLR
40.0000 mg | Freq: Two times a day (BID) | INTRAMUSCULAR | Status: AC
  Administered 2022-11-16: 40 mg via INTRAVENOUS
  Filled 2022-11-16: qty 1

## 2022-11-16 MED ORDER — NITROGLYCERIN IN D5W 200-5 MCG/ML-% IV SOLN
0.0000 ug/min | INTRAVENOUS | Status: DC
  Administered 2022-11-16: 5 ug/min via INTRAVENOUS
  Filled 2022-11-16: qty 250

## 2022-11-16 MED ORDER — SODIUM CHLORIDE 0.9 % IV SOLN
2.0000 g | INTRAVENOUS | Status: DC
  Administered 2022-11-16 – 2022-11-17 (×2): 2 g via INTRAVENOUS
  Filled 2022-11-16 (×3): qty 20

## 2022-11-16 MED ORDER — CHLORHEXIDINE GLUCONATE CLOTH 2 % EX PADS
6.0000 | MEDICATED_PAD | Freq: Every day | CUTANEOUS | Status: DC
  Administered 2022-11-17: 6 via TOPICAL
  Filled 2022-11-16: qty 6

## 2022-11-16 MED ORDER — LIDOCAINE HCL (PF) 1 % IJ SOLN: 5.0000 mL | INTRAMUSCULAR | Status: DC | PRN

## 2022-11-16 MED ORDER — PENTAFLUOROPROP-TETRAFLUOROETH EX AERO: 1.0000 | INHALATION_SPRAY | CUTANEOUS | Status: DC | PRN

## 2022-11-16 MED ORDER — METHYLPREDNISOLONE SODIUM SUCC 125 MG IJ SOLR
125.0000 mg | Freq: Once | INTRAMUSCULAR | Status: AC
  Administered 2022-11-16: 125 mg via INTRAVENOUS
  Filled 2022-11-16: qty 2

## 2022-11-16 MED ORDER — HYDROCOD POLI-CHLORPHE POLI ER 10-8 MG/5ML PO SUER: 5.0000 mL | Freq: Two times a day (BID) | ORAL | Status: DC | PRN

## 2022-11-16 MED ORDER — ISOSORBIDE DINITRATE 10 MG PO TABS
10.0000 mg | ORAL_TABLET | Freq: Three times a day (TID) | ORAL | Status: DC
  Administered 2022-11-16 – 2022-11-17 (×4): 10 mg via ORAL
  Filled 2022-11-16 (×6): qty 1

## 2022-11-16 MED ORDER — PREDNISONE 20 MG PO TABS
40.0000 mg | ORAL_TABLET | Freq: Every day | ORAL | Status: DC
  Administered 2022-11-17: 40 mg via ORAL
  Filled 2022-11-16: qty 2

## 2022-11-16 MED ORDER — LABETALOL HCL 5 MG/ML IV SOLN
INTRAVENOUS | Status: AC
  Filled 2022-11-16: qty 4

## 2022-11-16 MED ORDER — GUAIFENESIN ER 600 MG PO TB12: 600.0000 mg | ORAL_TABLET | Freq: Two times a day (BID) | ORAL | Status: DC

## 2022-11-16 MED ORDER — HEPARIN SODIUM (PORCINE) 1000 UNIT/ML DIALYSIS
1000.0000 [IU] | INTRAMUSCULAR | Status: DC | PRN
  Administered 2022-11-16: 3200 [IU]
  Filled 2022-11-16 (×2): qty 1

## 2022-11-16 MED ORDER — ASPIRIN 81 MG PO TBEC
81.0000 mg | DELAYED_RELEASE_TABLET | Freq: Every day | ORAL | Status: DC
  Administered 2022-11-16 – 2022-11-17 (×2): 81 mg via ORAL
  Filled 2022-11-16 (×2): qty 1

## 2022-11-16 MED ORDER — ACETAMINOPHEN 325 MG PO TABS: 650.0000 mg | ORAL_TABLET | Freq: Four times a day (QID) | ORAL | Status: DC | PRN

## 2022-11-16 MED ORDER — ONDANSETRON HCL 4 MG/2ML IJ SOLN: 4.0000 mg | Freq: Four times a day (QID) | INTRAMUSCULAR | Status: DC | PRN

## 2022-11-16 MED ORDER — BENZONATATE 100 MG PO CAPS: 100.0000 mg | ORAL_CAPSULE | Freq: Three times a day (TID) | ORAL | Status: DC | PRN

## 2022-11-16 MED ORDER — HEPARIN SODIUM (PORCINE) 1000 UNIT/ML DIALYSIS: 1000.0000 [IU] | INTRAMUSCULAR | Status: DC | PRN

## 2022-11-16 MED ORDER — ANTICOAGULANT SODIUM CITRATE 4% (200MG/5ML) IV SOLN: 5.0000 mL | Status: DC | PRN

## 2022-11-16 MED ORDER — LABETALOL HCL 5 MG/ML IV SOLN
10.0000 mg | Freq: Once | INTRAVENOUS | Status: AC
  Administered 2022-11-16: 10 mg via INTRAVENOUS

## 2022-11-16 MED ORDER — METOPROLOL SUCCINATE ER 25 MG PO TB24
25.0000 mg | ORAL_TABLET | Freq: Every day | ORAL | Status: DC
  Filled 2022-11-16: qty 1

## 2022-11-16 NOTE — Assessment & Plan Note (Addendum)
-   This is likely secondary to fluid overload in the setting of end-stage renal disease on TTS. -  Will continue diuresis with IV Lasix.  - Nephrology consult will be obtained.  - I notified Dr. Thedore Mins about the patient.

## 2022-11-16 NOTE — Progress Notes (Signed)
Hemodialysis Note  Received patient in bed to unit. Alert and oriented. Informed consent signed and in chart.   Treatment initiated: 1326 Treatment completed:1739  Patient tolerated treatment well. Transported back to the room alert, without acute distress. Report given to patient's RN.  Access used:RIJ Catheter  Access issues: High Arterial Pressure resulting in lower blood flow rate and line reversal.   Total UF removed: 4000 ml  Medications given: Heparin, Labetalol  Post HD VS: Hypertensive  Post HD weight: 53.7 kg    Bartolo Darter, RN [facility]

## 2022-11-16 NOTE — Progress Notes (Signed)
PT Cancellation Note  Patient Details Name: Anthony Hess MRN: [id number] DOB: [birth year]   Cancelled Treatment:    Reason Eval/Treat Not Completed: Medical issues which prohibited therapy (K+: 5.9, Ca++ 5.6, both far outside of safe range for exercise/exertion. Will continue to follow and commence services once appropriate.)  10:26 AM, 11/16/22 Rosamaria Lints, PT, DPT Physical Therapist - [facility]  [phone] (ASCOM)    Nana Vastine C 11/16/2022, 10:26 AM

## 2022-11-16 NOTE — Progress Notes (Signed)
[facility]  ROUNDING NOTE   Subjective:   Anthony Hess is a 67 year old male with past medical conditions including hypertension, PVD, dyslipidemia, CAD, four-vessel CABG, and end-stage renal disease on hemodialysis.  Patient presents to the emergency department with complaints of shortness of breath and has been admitted for Acute respiratory failure with hypoxia (HCC) [J96.01]  Patient is known to our practice and receives outpatient dialysis at [facility] on a TTS schedule, followed by Dr Cherylann Ratel.  Last treatment completed on Saturday however patient states clinic had issues with his PermCath and not much fluid removed.  Patient states he awoke with shortness of breath during the night.  He presented to the emergency department where he was placed on BiPAP and nitro drip due to elevated blood pressure.  Patient seen later in morning, resting comfortably on stretcher.  Weaned to room air.  Patient states shortness of breath had improved.  Nitroglycerin drip remained in place, blood pressure 146/102.  Labs on ED arrival significant for potassium 5.9, calcium 5.8, BNP greater than 4500, and hemoglobin 11.2.  Chest x-ray suspicious for interstitial edema.  We have been consulted to manage hemodialysis needs.   Objective:  Vital signs in last 24 hours:  Temp:  [98 F (36.7 C)-98.1 F (36.7 C)] 98 F (36.7 C) (09/24 1314) Pulse Rate:  [77-101] 79 (09/24 1314) Resp:  [13-35] 21 (09/24 1314) BP: (134-212)/(101-139) 182/124 (09/24 1314) SpO2:  [87 %-100 %] 97 % (09/24 1314) FiO2 (%):  [30 %] 30 % (09/24 0350) Weight:  [57.6 kg-59.9 kg] 57.6 kg (09/24 1314)  Weight change:  Filed Weights   11/16/22 0209 11/16/22 1314  Weight: 59.9 kg 57.6 kg    Intake/Output: No intake/output data recorded.   Intake/Output this shift:  No intake/output data recorded.  Physical Exam: General: NAD, resting comfortably  Head: Normocephalic, atraumatic. Moist oral mucosal membranes  Eyes:  Anicteric  Lungs:  Fine basilar crackles  Heart: Regular rate and rhythm  Abdomen:  Soft, nontender,   Extremities: No peripheral edema.  Neurologic: Alert and oriented moving all four extremities  Skin: No lesions  Access: Rt Permcath    Basic Metabolic Panel: Recent Labs  Lab 11/16/22 0213 11/16/22 0600  NA 138 138  K 5.9* 5.9*  CL 102 105  CO2 16* 11*  GLUCOSE 98 159*  BUN 91* 91*  CREATININE 9.45* 9.53*  CALCIUM 5.8* 5.6*    Liver Function Tests: No results for input(s): "AST", "ALT", "ALKPHOS", "BILITOT", "PROT", "ALBUMIN" in the last 168 hours. No results for input(s): "LIPASE", "AMYLASE" in the last 168 hours. No results for input(s): "AMMONIA" in the last 168 hours.  CBC: Recent Labs  Lab 11/16/22 0213 11/16/22 0600  WBC 12.8* 12.0*  NEUTROABS 9.0*  --   HGB 11.2* 10.2*  HCT 36.9* 32.4*  MCV 97.4 93.9  PLT 307 289    Cardiac Enzymes: No results for input(s): "CKTOTAL", "CKMB", "CKMBINDEX", "TROPONINI" in the last 168 hours.  BNP: Invalid input(s): "POCBNP"  CBG: No results for input(s): "GLUCAP" in the last 168 hours.  Microbiology: Results for orders placed or performed during the hospital encounter of 10/14/22  SARS Coronavirus 2 by RT PCR (hospital order, performed in [facility] hospital lab) *cepheid single result test* Anterior Nasal Swab     Status: Abnormal   Collection Time: 10/14/22 11:12 PM   Specimen: Anterior Nasal Swab  Result Value Ref Range Status   SARS Coronavirus 2 by RT PCR POSITIVE (A) NEGATIVE Final  Comment: (NOTE) SARS-CoV-2 target nucleic acids are DETECTED  SARS-CoV-2 RNA is generally detectable in upper respiratory specimens  during the acute phase of infection.  Positive results are indicative  of the presence of the identified virus, but do not rule out bacterial infection or co-infection with other pathogens not detected by the test.  Clinical correlation with patient history and  other diagnostic information  is necessary to determine patient infection status.  The expected result is negative.  Fact Sheet for Patients:   RoadLapTop.co.za   Fact Sheet for Healthcare Providers:   http://kim-miller.com/    This test is not yet approved or cleared by the [place] FDA and  has been authorized for detection and/or diagnosis of SARS-CoV-2 by FDA under an Emergency Use Authorization (EUA).  This EUA will remain in effect (meaning this test can be used) for the duration of  the COVID-19 declaration under Section 564(b)(1)  of the Act, 21 U.S.C. section 360-bbb-3(b)(1), unless the authorization is terminated or revoked sooner.   Performed at [facility], [address] [place], [place] [address]     Coagulation Studies: No results for input(s): "LABPROT", "INR" in the last 72 hours.  Urinalysis: No results for input(s): "COLORURINE", "LABSPEC", "PHURINE", "GLUCOSEU", "HGBUR", "BILIRUBINUR", "KETONESUR", "PROTEINUR", "UROBILINOGEN", "NITRITE", "LEUKOCYTESUR" in the last 72 hours.  Invalid input(s): "APPERANCEUR"    Imaging: DG Chest Port 1 View  Result Date: 11/16/2022 CLINICAL DATA:  Shortness of breath EXAM: PORTABLE CHEST 1 VIEW COMPARISON:  10/21/2022 FINDINGS: Cardiomegaly. Prior median sternotomy for CABG with stable contours. Dialysis catheter on the right with tip at the upper right atrium. Few Kerley lines are noted, especially on the right. No effusion or airspace disease. IMPRESSION: Possible, minimal interstitial edema. Electronically Signed   By: Tiburcio Pea M.D.   On: 11/16/2022 04:52     Medications:    anticoagulant sodium citrate     cefTRIAXone (ROCEPHIN)  IV Stopped (11/16/22 0559)   nitroGLYCERIN Stopped (11/16/22 1302)    amLODipine  2.5 mg Oral Daily   apixaban  5 mg Oral BID   ascorbic acid  500 mg Oral Daily   aspirin EC  81 mg Oral Daily   calcium acetate  667 mg Oral TID WC   Chlorhexidine  Gluconate Cloth  6 each Topical Q0600   furosemide  80 mg Intravenous BID   guaiFENesin  600 mg Oral BID   ipratropium-albuterol  3 mL Nebulization QID   isosorbide dinitrate  10 mg Oral TID   labetalol  10 mg Intravenous Once   methylPREDNISolone (SOLU-MEDROL) injection  40 mg Intravenous Q12H   Followed by   Melene Muller ON 11/17/2022] predniSONE  40 mg Oral Q breakfast   metoprolol succinate  25 mg Oral Daily   pantoprazole  40 mg Oral Daily   zinc sulfate  220 mg Oral Daily   acetaminophen **OR** acetaminophen, alteplase, anticoagulant sodium citrate, benzonatate, chlorpheniramine-HYDROcodone, feeding supplement (NEPRO CARB STEADY), heparin, lidocaine (PF), lidocaine-prilocaine, magnesium hydroxide, ondansetron **OR** ondansetron (ZOFRAN) IV, pentafluoroprop-tetrafluoroeth, traZODone  Assessment/ Plan:  Mr. Brittany Pierrelouis is a 67 y.o.  male with past medical conditions including hypertension, PVD, dyslipidemia, CAD, four-vessel CABG, and end-stage renal disease on hemodialysis.  Patient presents to the emergency department with complaints of shortness of breath and has been admitted for Acute respiratory failure with hypoxia (HCC) [J96.01]  [facility]/TTS/ Rt permcath   End stage renal disease on hemodialysis. Scheduled for dialysis later today. Will offer UF only treatment tomorrow to manage fluid  status.   2. Acute respiratory failure requiring Bipap on ED arrival. Likely secondary to hypertension, symptoms improved with blood pressure control. Nitroglycerin drip in place. Will given am bp meds and wean nitroglycerin drip.   *BP remains elevated in dialysis, will order IV Labetalol 10mg  once. If needed, can give Clonidine 0.2mg  once after .*  3. Anemia of chronic kidney disease  Lab Results  Component Value Date   HGB 10.2 (L) 11/16/2022    Hgb stable. Will continue to monitor  4. Secondary Hyperparathyroidism: with outpatient labs: PTH 299, phosphorus 11.9, calcium 6.7 on 11/11/22.   :  Lab Results  Component Value Date   PTH 170 (H) 06/25/2022   CALCIUM 5.6 (LL) 11/16/2022   CAION 0.82 (LL) 10/13/2022   PHOS 8.1 (H) 10/16/2022    Hypocalcemia noted, calcium acetate ordered outpatient. Will utilize calcium 3 bath during dialysis.    LOS: 0 Ellery Tash 9/24/20241:58 PM

## 2022-11-16 NOTE — H&P (Signed)
[place]   PATIENT NAME: Anthony Hess    MR#:  [id number]  DATE OF BIRTH:  01/05/1956  DATE OF ADMISSION:  11/16/2022  PRIMARY CARE PHYSICIAN: Ronnald Ramp, MD   Patient is coming from: Home  REQUESTING/REFERRING PHYSICIAN: Pilar Jarvis, MD CHIEF COMPLAINT:   Chief Complaint  Patient presents with   Shortness of Breath    Brought in byb medic from home. Patient states he became sob and was unable to recover. CO2 21 40 respiration rate. 260/120 when medic arrived, 92% on room air - currently on 3L [place]. History of dialysis - Tues, Thurs, Sat. Denies chest pain, dizziness.    HISTORY OF PRESENT ILLNESS:  Anthony Hess is a 67 y.o. male with medical history significant for end-stage renal disease on hemodialysis on TTS, coronary artery disease, status post CABG, tobacco abuse, peripheral vascular disease, dyslipidemia and urolithiasis, who presented to the emergency room, who presented to the emergency room with acute onset of worsening dyspnea since last night without significant cough or wheezing.  He denied any fever or chills.  No chest pain or palpitations.  No nausea or vomiting or abdominal pain.  He is not missed hemodialysis.  ED Course: When he came to the ER, BP was 148/85 and later 154/100 with respiratory to 18 and later 24 and pulse symmetry is dropped to 87% on room air before he was placed on BiPAP.  VBG showed pH 7.179 but carbonate 15.7 with pCO2 43 and pO2 39.  BMP revealed potassium 5.9 and BUN 91 creatinine 9.45 and calcium of 5.8 with anion gap of 20 and BNP was more than 4500.  Lactic acid was 1.1 and CBC showed leukocytosis 12.9 with neutrophilia as well as anemia  EKG as reviewed by me : EKG showed sinus rhythm with a rate of 83 with left anterior fascicular block Imaging: Portable chest x-ray showed mild interstitial edema.  The patient was given 80 mg of IV Lasix, DuoNeb and 125 mg IV Solu-Medrol.  He will be admitted to a progressive bed for  further evaluation and management PAST MEDICAL HISTORY:   Past Medical History:  Diagnosis Date   Aortic atherosclerosis (HCC)    Bilateral carotid artery disease (HCC)    Bladder cancer (HCC)    Coronary artery disease 12/20/2018   a.) LHC 12/20/2018: 50% OM1, 40% OM2, 95% o-pLAD, 75% o=pLCx, 40% mLM, 70% D1, 60% mRCA-1, 50% mRCA-2; refer to CVTS. b.) 4v CABG at [facility] on 12/27/2018: LIMA-LAD, RIMA-PDA, seg LRA-OM1-D1   DCM (dilated cardiomyopathy) (HCC) 12/05/2018   a.) TTE 12/05/2018: EF 40-45%. b.) TTE 12/28/2019: EF 20-25%.   Dyspnea 10/15/2022   ESRD (end stage renal disease) (HCC)    a.) T-Th-Sat   HFrEF (heart failure with reduced ejection fraction) (HCC) 12/05/2018   a.) TTE 12/05/2018: EF 40-45%; mild LVH; ant/apical/sep HK; mild TR . b.) TTE 12/28/2019: EF 20-25%; mod LVH; mod MR/AR; G1DD.   History of 2019 novel coronavirus disease (COVID-19) 04/08/2021   History of kidney stones    HLD (hyperlipidemia)    Hx of CABG 12/27/2018   Hypertension    Infrarenal abdominal aortic aneurysm (AAA) without rupture (HCC) 03/05/2021   a.) CT abd/pelvis; measured 3.2 cm.   Melena 05/04/2022   Myocardial infarction [facility])    NSTEMI (non-ST elevated myocardial infarction) (HCC) 04/19/2022   Perforation bowel (HCC) 04/26/2022   PVD (peripheral vascular disease) (HCC)    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   a.) LIMA-LAD, RIMA-PDA, sequential  LEFT radial artery to OM1 and D1   Sepsis (HCC) 03/14/2021   Wears glasses     PAST SURGICAL HISTORY:   Past Surgical History:  Procedure Laterality Date   AV FISTULA PLACEMENT Left 07/30/2021   Procedure: INSERTION OF ARTERIOVENOUS (AV) GORE-TEX GRAFT ARM BRACHIAL ARTERY TO AXILLARY VEIN;  Surgeon: Annice Needy, MD;  Location: [facility] ORS;  Service: Vascular;  Laterality: Left;   CAPD INSERTION N/A 12/31/2019   Procedure: LAPAROSCOPIC INSERTION CONTINUOUS AMBULATORY PERITONEAL DIALYSIS  (CAPD) CATHETER;  Surgeon: Leafy Ro, MD;  Location: [facility] OR[facility];  Service:  General;  Laterality: N/A;   CAPD REMOVAL N/A 04/10/2020   Procedure: LAPAROSCOPIC REVISION OF CONTINUOUS AMBULATORY PERITONEAL DIALYSIS  (CAPD) CATHETER;  Surgeon: Leafy Ro, MD;  Location: [facility];  Service: General;  Laterality: N/A;   CORONARY ARTERY BYPASS GRAFT N/A 12/27/2018   Procedure: CORONARY ARTERY BYPASS GRAFTING (CABG) X 4 ON PUMP USING RIGHT & LEFT INTERNAL MAMMARY ARTERY LEFT RADIAL ARTERY ENDOSCOPICALLY HARVESTED;  Surgeon: Linden Dolin, MD;  Location: [facility];  Service: Open Heart Surgery;  Laterality: N/A;   CYSTOSCOPY W/ RETROGRADES Bilateral 05/15/2019   Procedure: CYSTOSCOPY WITH RETROGRADE PYELOGRAM;  Surgeon: Riki Altes, MD;  Location: [facility];  Service: Urology;  Laterality: Bilateral;   CYSTOSCOPY WITH BIOPSY N/A 05/15/2019   Procedure: CYSTOSCOPY WITH bladder BIOPSY;  Surgeon: Riki Altes, MD;  Location: [facility];  Service: Urology;  Laterality: N/A;   DIALYSIS/PERMA CATHETER INSERTION N/A 12/28/2019   Procedure: DIALYSIS/PERMA CATHETER INSERTION;  Surgeon: Annice Needy, MD;  Location: [facility];  Service: Cardiovascular;  Laterality: N/A;   DIALYSIS/PERMA CATHETER INSERTION N/A 03/18/2021   Procedure: DIALYSIS/PERMA CATHETER INSERTION;  Surgeon: Annice Needy, MD;  Location: [facility];  Service: Cardiovascular;  Laterality: N/A;   DIALYSIS/PERMA CATHETER REMOVAL N/A 06/02/2020   Procedure: DIALYSIS/PERMA CATHETER REMOVAL;  Surgeon: Annice Needy, MD;  Location: [facility];  Service: Cardiovascular;  Laterality: N/A;   DIALYSIS/PERMA CATHETER REPAIR N/A 10/22/2022   Procedure: DIALYSIS/PERMA CATHETER REPAIR;  Surgeon: Renford Dills, MD;  Location: [facility];  Service: Cardiovascular;  Laterality: N/A;   EXCHANGE OF A DIALYSIS CATHETER Right 04/10/2020   Procedure: EXCHANGE OF A DIALYSIS CATHETER;  Surgeon: Leafy Ro, MD;  Location: [facility];  Service: General;  Laterality: Right;   INCISIONAL HERNIA REPAIR   01/20/2021   Procedure: HERNIA REPAIR INCISIONAL;  Surgeon: Henrene Dodge, MD;  Location: [facility];  Service: General;;   IR IMAGE GUIDED DRAINAGE PERCUT CATH  PERITONEAL RETROPERIT  04/07/2020   LAPAROSCOPY N/A 04/16/2021   Procedure: LAPAROSCOPY DIAGNOSTIC;  Surgeon: Sung Amabile, DO;  Location: [facility];  Service: General;  Laterality: N/A;   LAPAROTOMY N/A 04/26/2022   Procedure: EXPLORATORY LAPAROTOMY WITH REPAIR OF DUODENAL ULCER;  Surgeon: Carolan Shiver, MD;  Location: [facility];  Service: General;  Laterality: N/A;   LEFT HEART CATH AND CORONARY ANGIOGRAPHY Left 12/20/2018   Procedure: LEFT HEART CATH AND CORONARY ANGIOGRAPHY;  Surgeon: Marcina Millard, MD;  Location: [facility];  Service: Cardiovascular;  Laterality: Left;   RADIAL ARTERY HARVEST Left 12/27/2018   Procedure: ENDOSCOPIC RADIAL ARTERY HARVEST;  Surgeon: Linden Dolin, MD;  Location: [facility];  Service: Open Heart Surgery;  Laterality: Left;   REMOVAL OF A DIALYSIS CATHETER Left 03/20/2021   Procedure: REMOVAL OF A PD CATHETER;  Surgeon: Annice Needy, MD;  Location: [facility];  Service: Vascular;  Laterality: Left;   REVISION OF ARTERIOVENOUS GORETEX GRAFT Left 09/11/2021   Procedure: Excisionof infected AV graft;  Surgeon: Renford Dills, MD;  Location: [facility];  Service: Vascular;  Laterality: Left;   TEE WITHOUT CARDIOVERSION N/A 12/27/2018   Procedure: TRANSESOPHAGEAL ECHOCARDIOGRAM (TEE);  Surgeon: Linden Dolin, MD;  Location: [facility] OR;  Service: Open Heart Surgery;  Laterality: N/A;   TUMOR REMOVAL  2019   Bladder    SOCIAL HISTORY:   Social History   Tobacco Use   Smoking status: Every Day    Current packs/day: 0.00    Types: Cigarettes    Last attempt to quit: 02/19/2019    Years since quitting: 3.7   Smokeless tobacco: Never  Substance Use Topics   Alcohol use: Never    FAMILY HISTORY:   Family History  Problem Relation Age of Onset   Heart failure Mother     DRUG  ALLERGIES:  No Known Allergies  REVIEW OF SYSTEMS:   ROS As per history of present illness. All pertinent systems were reviewed above. Constitutional, HEENT, cardiovascular, respiratory, GI, GU, musculoskeletal, neuro, psychiatric, endocrine, integumentary and hematologic systems were reviewed and are otherwise negative/unremarkable except for positive findings mentioned above in the HPI.   MEDICATIONS AT HOME:   Prior to Admission medications   Medication Sig Start Date End Date Taking? Authorizing Provider  amLODipine (NORVASC) 2.5 MG tablet Take 1 tablet (2.5 mg total) by mouth daily. 05/07/22   Alford Highland, MD  apixaban (ELIQUIS) 5 MG TABS tablet Take 1 tablet (5 mg total) by mouth 2 (two) times daily. 05/01/22   Baron Hamper, MD  ascorbic acid (VITAMIN C) 250 MG tablet Take 2 tablets (500 mg total) by mouth daily. 10/16/22   Arnetha Courser, MD  aspirin EC 81 MG tablet Take 81 mg by mouth daily. Patient not taking: Reported on 10/15/2022 05/06/20   [provider]  benzonatate (TESSALON PERLES) 100 MG capsule Take 1 capsule (100 mg total) by mouth 3 (three) times daily as needed for cough. 10/15/22 10/15/23  Arnetha Courser, MD  calcium acetate (PHOSLO) 667 MG capsule Take 1 capsule (667 mg total) by mouth 3 (three) times daily with meals. 11/18/21   Sunnie Nielsen, DO  guaiFENesin (MUCINEX) 600 MG 12 hr tablet Take 1 tablet (600 mg total) by mouth 2 (two) times daily. 10/15/22   Arnetha Courser, MD  isosorbide dinitrate (ISORDIL) 10 MG tablet Take 1 tablet (10 mg total) by mouth 3 (three) times daily. 10/15/22   Arnetha Courser, MD  metoprolol succinate (TOPROL-XL) 25 MG 24 hr tablet Take 25 mg by mouth daily. 03/12/22   [provider]  ondansetron (ZOFRAN-ODT) 4 MG disintegrating tablet Take 1 tablet (4 mg total) by mouth every 8 (eight) hours as needed for nausea or vomiting. 06/25/22   Simmons-Robinson, Makiera, MD  pantoprazole (PROTONIX) 40 MG tablet Take 1 tablet (40 mg  total) by mouth daily. 05/07/22 06/06/22  Alford Highland, MD  zinc sulfate 220 (50 Zn) MG capsule Take 1 capsule (220 mg total) by mouth daily. 10/16/22   Arnetha Courser, MD      VITAL SIGNS:  Blood pressure (!) 181/114, pulse 86, temperature 98.1 F (36.7 C), temperature source Oral, resp. rate 20, height 5\' 2"  (1.575 m), weight 59.9 kg, SpO2 99%.  PHYSICAL EXAMINATION:  Physical Exam  GENERAL: Acutely ill 67 y.o.-year-old Caucasian male patient lying in the bed with mild distress with conversational dyspnea on BiPAP.   EYES: Pupils equal, round, reactive  to light and accommodation. No scleral icterus. Extraocular muscles intact.  HEENT: Head atraumatic, normocephalic. Oropharynx and nasopharynx clear.  NECK:  Supple, no jugular venous distention. No thyroid enlargement, no tenderness.  LUNGS: Diminished bibasal breath sounds with bibasal rales.  No use of accessory muscles of respiration.  CARDIOVASCULAR: Regular rate and rhythm, S1, S2 normal. No murmurs, rubs, or gallops.  ABDOMEN: Soft, nondistended, nontender. Bowel sounds present. No organomegaly or mass.  EXTREMITIES: No pedal edema, cyanosis, or clubbing.  NEUROLOGIC: Cranial nerves II through XII are intact. Muscle strength 5/5 in all extremities. Sensation intact. Gait not checked.  PSYCHIATRIC: The patient is alert and oriented x 3.  Normal affect and good eye contact. SKIN: No obvious rash, lesion, or ulcer.   LABORATORY PANEL:   CBC Recent Labs  Lab 11/16/22 0213  WBC 12.8*  HGB 11.2*  HCT 36.9*  PLT 307   ------------------------------------------------------------------------------------------------------------------  Chemistries  Recent Labs  Lab 11/16/22 0213  NA 138  K 5.9*  CL 102  CO2 16*  GLUCOSE 98  BUN 91*  CREATININE 9.45*  CALCIUM 5.8*   ------------------------------------------------------------------------------------------------------------------  Cardiac Enzymes No results for  input(s): "TROPONINI" in the last 168 hours. ------------------------------------------------------------------------------------------------------------------  RADIOLOGY:  DG Chest Port 1 View  Result Date: 11/16/2022 CLINICAL DATA:  Shortness of breath EXAM: PORTABLE CHEST 1 VIEW COMPARISON:  10/21/2022 FINDINGS: Cardiomegaly. Prior median sternotomy for CABG with stable contours. Dialysis catheter on the right with tip at the upper right atrium. Few Kerley lines are noted, especially on the right. No effusion or airspace disease. IMPRESSION: Possible, minimal interstitial edema. Electronically Signed   By: Tiburcio Pea M.D.   On: 11/16/2022 04:52      IMPRESSION AND PLAN:  Assessment and Plan: * Acute respiratory failure with hypoxia (HCC) - This is likely secondary to fluid overload in the setting of end-stage renal disease on TTS. -  Will continue diuresis with IV Lasix.  - Nephrology consult will be obtained.  - I notified Dr. Thedore Mins about the patient.  COPD with acute exacerbation (HCC) - We will place the patient IV steroid therapy with IV Solu-Medrol as well as nebulized bronchodilator therapy with duonebs q.i.d. and q.4 hours p.r.n.Marland Kitchen - Mucolytic therapy will be provided with Mucinex and antibiotic therapy with IV Rocephin. - O2 protocol will be followed.   Paroxysmal atrial fibrillation (HCC) - We will continue his Eliquis and Toprol-XL.  Essential hypertension - We will continue his antihypertensive therapy.  GERD without esophagitis - We will continue PPI therapy.  Coronary artery disease - We will continue isosorbide dinitrate and aspirin   DVT prophylaxis: Eliquis Advanced Care Planning:  Code Status: full code.  Family Communication:  The plan of care was discussed in details with the patient (and family). I answered all questions. The patient agreed to proceed with the above mentioned plan. Further management will depend upon hospital course. Disposition  Plan: Back to previous home environment Consults called: Nephrology All the records are reviewed and case discussed with ED provider.  Status is: Inpatient  At the time of the admission, it appears that the appropriate admission status for this patient is inpatient.  This is judged to be reasonable and necessary in order to provide the required intensity of service to ensure the patient's safety given the presenting symptoms, physical exam findings and initial radiographic and laboratory data in the context of comorbid conditions.  The patient requires inpatient status due to high intensity of service, high risk of further deterioration and  high frequency of surveillance required.  I certify that at the time of admission, it is my clinical judgment that the patient will require inpatient hospital care extending more than 2 midnights.                            Dispo: The patient is from: Home              Anticipated d/c is to: Home              Patient currently is not medically stable to d/c.              Difficult to place patient: No  Authorized and performed by: Valente David, MD Total critical care time:    55    minutes. Due to a high probability of clinically significant, life-threatening deterioration, the patient required my highest level of preparedness to intervene emergently and I personally spent this critical care time directly and personally managing the patient.  This critical care time included obtaining a history, examining the patient, pulse oximetry, ordering and review of studies, arranging urgent treatment with development of management plan, evaluation of patient's response to treatment, frequent reassessment, and discussions with other providers. This critical care time was performed to assess and manage the high probability of imminent, life-threatening deterioration that could result in multiorgan failure.  It was exclusive of separately billable procedures and treating other  patients and teaching time.   Hannah Beat M.D on 11/16/2022 at 5:43 AM  [facility]   From 7 PM-7 AM, contact night-coverage [email or web address]  CC: Primary care physician; Ronnald Ramp, MD

## 2022-11-16 NOTE — Assessment & Plan Note (Signed)
-   We will continue PPI therapy 

## 2022-11-16 NOTE — Discharge Planning (Signed)
ESTABLISHED HEMODIALYSIS  Outpatient Facility [facility] [address] [place], [place] [address] [phone]  Schedule: Tuesday Thursday and Saturday @ 6:00am  Dimas Chyle Dialysis Coordinator II  [facility] Cell: [phone] eFax: [phone] [email or web address]

## 2022-11-16 NOTE — ED Notes (Signed)
Calcium, critical 5.8  Sherelle L MD notified

## 2022-11-16 NOTE — ED Notes (Addendum)
Critical Calcium 5.6 .  Anthony Hess - Lab

## 2022-11-16 NOTE — Progress Notes (Addendum)
Brief hospitalist update note.  This is a nonbillable note.  Please see same-day H&P for full billable details.  Briefly, this is a 67 year old male history significant for ESRD on HD TTS, CAD, status post CABG, tobacco abuse, PVD, hyperlipidemia who presents to the ER with acute onset of worsening dyspnea x 1 day without significant cough or wheeze.  No chest pain or palpitations.  Patient was placed on BiPAP due to severe respiratory distress and increased work of breathing.  Blood pressure remains markedly elevated.  Nephrology engaged on admission.  Patient will undergo hemodialysis in hospital today.  He does not appear markedly fluid overloaded on examination however his BNP is greater than 4500.  I suspect a combination of fluid overload in the setting of hypertensive urgency as well as possible mild COPD exacerbation.  Patient on IV steroids and breathing treatments for COPD.  Dialysis to achieve blood pressure control and stabilization of electrolytes.  Possible discharge in 1 to 2 days.  Spoke with wife Fulton Mole [phone].  She states patient is totally nonadherent with meds, fluid restrict, diet restrict, continues to smoke 2ppd  Lolita Patella MD  No charge

## 2022-11-16 NOTE — Assessment & Plan Note (Signed)
-   We will continue isosorbide dinitrate and aspirin

## 2022-11-16 NOTE — Assessment & Plan Note (Addendum)
-   We will place the patient IV steroid therapy with IV Solu-Medrol as well as nebulized bronchodilator therapy with duonebs q.i.d. and q.4 hours p.r.n.Marland Kitchen - Mucolytic therapy will be provided with Mucinex and antibiotic therapy with IV Rocephin. - O2 protocol will be followed.

## 2022-11-16 NOTE — Progress Notes (Signed)
OT Cancellation Note  Patient Details Name: Anthony Hess MRN: [id number] DOB: 1955-11-03   Cancelled Treatment:    Reason Eval/Treat Not Completed: Medical issues which prohibited therapy (Chart reviewed (K+: 5.9, Ca++ 5.6 critical, both far outside of safe range for exercise/exertion.) Will continue to follow and attempt OT eval when medically appropriate.)  Kylani Wires L. Stephany Poorman, OTR/L  11/16/22, 10:39 AM

## 2022-11-16 NOTE — Assessment & Plan Note (Signed)
-  We will continue his Eliquis and Toprol-XL.

## 2022-11-16 NOTE — ED Notes (Signed)
Attempted to call wife, emergency contact - requested by patient.

## 2022-11-16 NOTE — ED Notes (Signed)
VBG sent to lab, RT notified.

## 2022-11-16 NOTE — ED Provider Notes (Signed)
[facility] Provider Note    Event Date/Time   First MD Initiated Contact with Patient 11/16/22 0205     (approximate)   History   Shortness of Breath (Brought in byb medic from home. Patient states he became sob and was unable to recover. CO2 21 40 respiration rate. 260/120 when medic arrived, 92% on room air - currently on 3L [place]. History of dialysis - Tues, Thurs, Sat. Denies chest pain, dizziness.)   HPI  Anthony Hess is a 67 y.o. male   Past medical history of End-stage renal disease on hemodialysis Tuesday, Thursday, Saturday, current smoker, CHF, CAD, on Eliquis, presents to the emergency department with shortness of breath starting last night.  No respiratory infectious symptoms or chest pain.    Compliant with all medications including antihypertensives, blood thinner, dialysis sessions.   He got a DuoNeb with EMS and feels slightly better with that treatment.  External Medical Documents Reviewed: August 2024 discharge summary for shortness of breath found to have PE subsegmental nonocclusive in the setting of stopping home Eliquis,, restarted home Eliquis upon discharge.      Physical Exam   Triage Vital Signs: ED Triage Vitals  Encounter Vitals Group     BP 11/16/22 0210 (!) 205/131     Systolic BP Percentile --      Diastolic BP Percentile --      Pulse Rate 11/16/22 0215 85     Resp 11/16/22 0210 (!) 22     Temp 11/16/22 0210 98.1 F (36.7 C)     Temp Source 11/16/22 0210 Oral     SpO2 11/16/22 0215 95 %     Weight 11/16/22 0209 132 lb 1.6 oz (59.9 kg)     Height 11/16/22 0209 5\' 2"  (1.575 m)     Head Circumference --      Peak Flow --      Pain Score 11/16/22 0208 0     Pain Loc --      Pain Education --      Exclude from Growth Chart --     Most recent vital signs: Vitals:   11/16/22 0220 11/16/22 0253  BP: (!) 207/139 (!) 189/133  Pulse: 85 92  Resp: (!) 24 (!) 25  Temp:    SpO2: 93% 97%    General: Awake, no  distress.  CV:  Good peripheral perfusion.  Resp:  Normal effort.  Abd:  No distention.  Other:     ED Results / Procedures / Treatments   Labs (all labs ordered are listed, but only abnormal results are displayed) Labs Reviewed  CBC WITH DIFFERENTIAL/PLATELET - Abnormal; Notable for the following components:      Result Value   WBC 12.8 (*)    RBC 3.79 (*)    Hemoglobin 11.2 (*)    HCT 36.9 (*)    RDW 18.1 (*)    Neutro Abs 9.0 (*)    Monocytes Absolute 1.3 (*)    Eosinophils Absolute 0.6 (*)    All other components within normal limits  BASIC METABOLIC PANEL - Abnormal; Notable for the following components:   Potassium 5.9 (*)    CO2 16 (*)    BUN 91 (*)    Creatinine, Ser 9.45 (*)    Calcium 5.8 (*)    GFR, Estimated 6 (*)    Anion gap 20 (*)    All other components within normal limits  BRAIN NATRIURETIC PEPTIDE - Abnormal; Notable for the following components:  B Natriuretic Peptide >4,500.0 (*)    All other components within normal limits  BLOOD GAS, VENOUS - Abnormal; Notable for the following components:   pH, Ven 7.17 (*)    pCO2, Ven 43 (*)    Bicarbonate 15.7 (*)    Acid-base deficit 12.4 (*)    All other components within normal limits  LACTIC ACID, PLASMA  LACTIC ACID, PLASMA     I ordered and reviewed the above labs they are notable for markedly high BNP and white blood cell count of 12.8, potassium elevated at 5.9 and a blood pH of 7.17 with normal CO2 despite tachypnea  EKG  ED ECG REPORT I, Pilar Jarvis, the attending physician, personally viewed and interpreted this ECG.   Date: 11/16/2022  EKG Time: 0211  Rate: 83  Rhythm: sinus  Axis: nl  Intervals:lafb  ST&T Change: no stemi    RADIOLOGY I independently reviewed and interpreted chest x-ray and see diffuse opacities of the lung bases concerning for pulmonary edema or infection I also reviewed radiologist's formal read.   PROCEDURES:  Critical Care performed: Yes, see critical  care procedure note(s)  .Critical Care  Performed by: Pilar Jarvis, MD Authorized by: Pilar Jarvis, MD   Critical care provider statement:    Critical care time (minutes):  30   Critical care was time spent personally by me on the following activities:  Development of treatment plan with patient or surrogate, discussions with consultants, evaluation of patient's response to treatment, examination of patient, ordering and review of laboratory studies, ordering and review of radiographic studies, ordering and performing treatments and interventions, pulse oximetry, re-evaluation of patient's condition and review of old charts    MEDICATIONS ORDERED IN ED: Medications  nitroGLYCERIN 50 mg in dextrose 5 % 250 mL (0.2 mg/mL) infusion (has no administration in time range)  furosemide (LASIX) injection 80 mg (has no administration in time range)  ipratropium-albuterol (DUONEB) 0.5-2.5 (3) MG/3ML nebulizer solution 9 mL (9 mLs Nebulization Given 11/16/22 0246)  methylPREDNISolone sodium succinate (SOLU-MEDROL) 125 mg/2 mL injection 125 mg (125 mg Intravenous Given 11/16/22 0246)    External physician / consultants:  I spoke with hospitalist for admission and regarding care plan for this patient.   IMPRESSION / MDM / ASSESSMENT AND PLAN / ED COURSE  I reviewed the triage vital signs and the nursing notes.                                Patient's presentation is most consistent with acute presentation with potential threat to life or bodily function.  Differential diagnosis includes, but is not limited to, respiratory distress due to COPD exacerbation, CHF exacerbation, pulmonary edema, hypertensive crisis   The patient is on the cardiac monitor to evaluate for evidence of arrhythmia and/or significant heart rate changes.  MDM:    Patient with shortness of breath only without chest pain, compliant with dialysis sessions and Eliquis, with distant lung sounds better with DuoNeb by EMS.   Tachypneic.  Hypertensive.  Will treat COPD exacerbation with steroids and DuoNebs.  I considered PE but doubtful given his compliance with Eliquis.  Hypertensive crisis may have led to pulmonary edema, as he has a high proBNP, respiratory distress, does not make urine.  His VBG shows acidosis with pCO2 that is inappropriately normal given his level of respiratory distress, will control blood pressure with nitroglycerin drip, started on BiPAP, admission and consultation with nephrology for  dialysis.       FINAL CLINICAL IMPRESSION(S) / ED DIAGNOSES   Final diagnoses:  Respiratory distress  Hypertensive crisis     Rx / DC Orders   ED Discharge Orders     None        Note:  This document was prepared using Dragon voice recognition software and may include unintentional dictation errors.    Pilar Jarvis, MD 11/16/22 [phone]

## 2022-11-16 NOTE — Assessment & Plan Note (Addendum)
-  We will continue his antihypertensive therapy.

## 2022-11-16 NOTE — ED Notes (Signed)
Pt took of Bipap mask due to "being able to breathe". Pt placed on 2 lpm via [place] due to low 90s O2 sats.

## 2022-11-17 DIAGNOSIS — Z7189 Other specified counseling: Secondary | ICD-10-CM | POA: Diagnosis not present

## 2022-11-17 DIAGNOSIS — J9601 Acute respiratory failure with hypoxia: Secondary | ICD-10-CM | POA: Diagnosis not present

## 2022-11-17 LAB — RENAL FUNCTION PANEL
Albumin: 3.3 g/dL — ABNORMAL LOW (ref 3.5–5.0)
Anion gap: 17 — ABNORMAL HIGH (ref 5–15)
BUN: 61 mg/dL — ABNORMAL HIGH (ref 8–23)
CO2: 22 mmol/L (ref 22–32)
Calcium: 6.6 mg/dL — ABNORMAL LOW (ref 8.9–10.3)
Chloride: 99 mmol/L (ref 98–111)
Creatinine, Ser: 6.65 mg/dL — ABNORMAL HIGH (ref 0.61–1.24)
GFR, Estimated: 9 mL/min — ABNORMAL LOW (ref >60)
Glucose, Bld: 177 mg/dL — ABNORMAL HIGH (ref 70–99)
Phosphorus: 9.4 mg/dL — ABNORMAL HIGH (ref 2.5–4.6)
Potassium: 4 mmol/L (ref 3.5–5.1)
Sodium: 138 mmol/L (ref 135–145)

## 2022-11-17 MED ORDER — IPRATROPIUM-ALBUTEROL 0.5-2.5 (3) MG/3ML IN SOLN: 3.0000 mL | Freq: Three times a day (TID) | RESPIRATORY_TRACT | Status: DC

## 2022-11-17 MED ORDER — PREDNISONE 20 MG PO TABS: 40.0000 mg | ORAL_TABLET | Freq: Every day | ORAL | 0 refills | Status: AC

## 2022-11-17 MED ORDER — NEPRO/CARBSTEADY PO LIQD
237.0000 mL | Freq: Two times a day (BID) | ORAL | Status: DC
  Administered 2022-11-17: 237 mL via ORAL

## 2022-11-17 MED ORDER — RENA-VITE PO TABS
1.0000 | ORAL_TABLET | Freq: Every day | ORAL | Status: DC
  Filled 2022-11-17: qty 1

## 2022-11-17 NOTE — Progress Notes (Signed)
[facility]  ROUNDING NOTE   Subjective:   Anthony Hess is a 67 year old male with past medical conditions including hypertension, PVD, dyslipidemia, CAD, four-vessel CABG, and end-stage renal disease on hemodialysis.  Patient presents to the emergency department with complaints of shortness of breath and has been admitted for Hypertensive crisis [I16.9] Respiratory distress [R06.03] Acute respiratory failure with hypoxia (HCC) [J96.01]  Patient is known to our practice and receives outpatient dialysis at [facility] on a TTS schedule, followed by Dr Cherylann Ratel.    Patient seen laying in bed Alert States he feels better today  Dialysis yesterday with 4L fluid removal   Objective:  Vital signs in last 24 hours:  Temp:  [97.4 F (36.3 C)-98.9 F (37.2 C)] 98.4 F (36.9 C) (09/25 1012) Pulse Rate:  [51-95] 90 (09/25 1012) Resp:  [14-35] 15 (09/25 1012) BP: (110-203)/(75-175) 110/76 (09/25 1012) SpO2:  [93 %-99 %] 99 % (09/25 1012) Weight:  [53.1 kg-57.6 kg] 54.1 kg (09/25 1012)  Weight change: -2.32 kg Filed Weights   11/16/22 1820 11/17/22 0333 11/17/22 1012  Weight: 53.7 kg 53.1 kg 54.1 kg    Intake/Output: I/O last 3 completed shifts: In: 1400 [P.O.:1200; IV Piggyback:200] Out: -    Intake/Output this shift:  Total I/O In: 240 [P.O.:240] Out: -   Physical Exam: General: NAD, resting comfortably  Head: Normocephalic, atraumatic. Moist oral mucosal membranes  Eyes: Anicteric  Lungs:  Clear to auscultation  Heart: Regular rate and rhythm  Abdomen:  Soft, nontender  Extremities: No peripheral edema.  Neurologic: Alert and oriented moving all four extremities  Skin: No lesions  Access: Rt Permcath    Basic Metabolic Panel: Recent Labs  Lab 11/16/22 0213 11/16/22 0600 11/17/22 0914  NA 138 138 138  K 5.9* 5.9* 4.0  CL 102 105 99  CO2 16* 11* 22  GLUCOSE 98 159* 177*  BUN 91* 91* 61*  CREATININE 9.45* 9.53* 6.65*  CALCIUM 5.8* 5.6* 6.6*  PHOS   --   --  9.4*    Liver Function Tests: Recent Labs  Lab 11/17/22 0914  ALBUMIN 3.3*   No results for input(s): "LIPASE", "AMYLASE" in the last 168 hours. No results for input(s): "AMMONIA" in the last 168 hours.  CBC: Recent Labs  Lab 11/16/22 0213 11/16/22 0600  WBC 12.8* 12.0*  NEUTROABS 9.0*  --   HGB 11.2* 10.2*  HCT 36.9* 32.4*  MCV 97.4 93.9  PLT 307 289    Cardiac Enzymes: No results for input(s): "CKTOTAL", "CKMB", "CKMBINDEX", "TROPONINI" in the last 168 hours.  BNP: Invalid input(s): "POCBNP"  CBG: No results for input(s): "GLUCAP" in the last 168 hours.  Microbiology: Results for orders placed or performed during the hospital encounter of 10/14/22  SARS Coronavirus 2 by RT PCR (hospital order, performed in [facility] hospital lab) *cepheid single result test* Anterior Nasal Swab     Status: Abnormal   Collection Time: 10/14/22 11:12 PM   Specimen: Anterior Nasal Swab  Result Value Ref Range Status   SARS Coronavirus 2 by RT PCR POSITIVE (A) NEGATIVE Final    Comment: (NOTE) SARS-CoV-2 target nucleic acids are DETECTED  SARS-CoV-2 RNA is generally detectable in upper respiratory specimens  during the acute phase of infection.  Positive results are indicative  of the presence of the identified virus, but do not rule out bacterial infection or co-infection with other pathogens not detected by the test.  Clinical correlation with patient history and  other diagnostic information is necessary  to determine patient infection status.  The expected result is negative.  Fact Sheet for Patients:   RoadLapTop.co.za   Fact Sheet for Healthcare Providers:   http://kim-miller.com/    This test is not yet approved or cleared by the [place] FDA and  has been authorized for detection and/or diagnosis of SARS-CoV-2 by FDA under an Emergency Use Authorization (EUA).  This EUA will remain in effect (meaning this  test can be used) for the duration of  the COVID-19 declaration under Section 564(b)(1)  of the Act, 21 U.S.C. section 360-bbb-3(b)(1), unless the authorization is terminated or revoked sooner.   Performed at [facility], [address] [place], [place] [address]     Coagulation Studies: No results for input(s): "LABPROT", "INR" in the last 72 hours.  Urinalysis: No results for input(s): "COLORURINE", "LABSPEC", "PHURINE", "GLUCOSEU", "HGBUR", "BILIRUBINUR", "KETONESUR", "PROTEINUR", "UROBILINOGEN", "NITRITE", "LEUKOCYTESUR" in the last 72 hours.  Invalid input(s): "APPERANCEUR"    Imaging: DG Chest Port 1 View  Result Date: 11/16/2022 CLINICAL DATA:  Shortness of breath EXAM: PORTABLE CHEST 1 VIEW COMPARISON:  10/21/2022 FINDINGS: Cardiomegaly. Prior median sternotomy for CABG with stable contours. Dialysis catheter on the right with tip at the upper right atrium. Few Kerley lines are noted, especially on the right. No effusion or airspace disease. IMPRESSION: Possible, minimal interstitial edema. Electronically Signed   By: Tiburcio Pea M.D.   On: 11/16/2022 04:52     Medications:    anticoagulant sodium citrate     cefTRIAXone (ROCEPHIN)  IV 2 g (11/17/22 0441)   nitroGLYCERIN Stopped (11/16/22 1302)    amLODipine  2.5 mg Oral Daily   apixaban  5 mg Oral BID   ascorbic acid  500 mg Oral Daily   aspirin EC  81 mg Oral Daily   calcium acetate  667 mg Oral TID WC   Chlorhexidine Gluconate Cloth  6 each Topical Q0600   feeding supplement (NEPRO CARB STEADY)  237 mL Oral BID BM   furosemide  80 mg Intravenous BID   guaiFENesin  600 mg Oral BID   ipratropium-albuterol  3 mL Nebulization QID   isosorbide dinitrate  10 mg Oral TID   metoprolol succinate  25 mg Oral Daily   multivitamin  1 tablet Oral QHS   pantoprazole  40 mg Oral Daily   predniSONE  40 mg Oral Q breakfast   zinc sulfate  220 mg Oral Daily   acetaminophen **OR** acetaminophen, alteplase,  anticoagulant sodium citrate, benzonatate, chlorpheniramine-HYDROcodone, feeding supplement (NEPRO CARB STEADY), heparin, lidocaine (PF), lidocaine-prilocaine, magnesium hydroxide, ondansetron **OR** ondansetron (ZOFRAN) IV, pentafluoroprop-tetrafluoroeth, traZODone  Assessment/ Plan:  Mr. Haidon Chiriboga is a 67 y.o.  male with past medical conditions including hypertension, PVD, dyslipidemia, CAD, four-vessel CABG, and end-stage renal disease on hemodialysis.  Patient presents to the emergency department with complaints of shortness of breath and has been admitted for Hypertensive crisis [I16.9] Respiratory distress [R06.03] Acute respiratory failure with hypoxia (HCC) [J96.01]  [facility]/TTS/ Rt permcath   End stage renal disease on hemodialysis. Patient received dialysis yesterday, UF 4L achieved. Will receive UF only treatment today with UF goal 2L. Next treatment scheduled for Thursday.  2. Acute respiratory failure requiring Bipap on ED arrival. Likely secondary to hypertension, symptoms improved with blood pressure control.   Blood pressure 105/70 at initiation of dialysis  3. Anemia of chronic kidney disease  Lab Results  Component Value Date   HGB 10.2 (L) 11/16/2022    Will continue to monitor  4. Secondary Hyperparathyroidism: with outpatient labs: PTH 299, phosphorus 11.9, calcium 6.7 on 11/11/22.  :  Lab Results  Component Value Date   PTH 170 (H) 06/25/2022   CALCIUM 6.6 (L) 11/17/2022   CAION 0.82 (LL) 10/13/2022   PHOS 9.4 (H) 11/17/2022    Hypocalcemia improved with dialysis, calcium acetate ordered with meals. Will utilize calcium 3 bath during dialysis.    LOS: 1 Erielle Gawronski 9/25/202410:30 AM

## 2022-11-17 NOTE — Consult Note (Addendum)
Consultation Note Date: 11/17/2022   Patient Name: Anthony Hess  DOB: [birth year]  MRN: [id number]  Age / Sex: 67 y.o., male  PCP: Ronnald Ramp, MD Referring Physician: Lurene Shadow, MD  Reason for Consultation: Establishing goals of care  HPI/Patient Profile: Anthony Hess is a 67 year old male with past medical conditions including hypertension, PVD, dyslipidemia, CAD, four-vessel CABG, and end-stage renal disease on hemodialysis.  Patient presents to the emergency department with complaints of shortness of breath and has been admitted for Hypertensive crisis   Clinical Assessment and Goals of Care: Notes and labs reviewed.    In to see patient.  He is currently resting in bed, no family at bedside.  He states he has been married for 37 years, and does not have children.  He states at baseline he is able to do things around the house and drives and does things independently.   Patient shares that he did PD for around 1 year but it was difficult for them and so he initiated HD around 2 years ago.  Patient articulates with great detail the lifestyle he has been told he should be living as it pertains to diet, fluid intake, and medications.  He states his wife does the best she can to cook and to abide by these instructions, but becomes frustrated because at times when he is out and about he will eat and drink as he should not.  He states she also becomes frustrated as he does not manage his hypertension the way that he knows he should.  He states when he is feeling good he does not want to take medication.  Upon initiating a conversation regarding goals of care, he states his wife will not be coming to the hospital as last admission and the current admission, he called 911 around 3 AM and waited outside on the porch for EMS to pick him up, and did not let his wife know he called 911 or was coming to  the hospital.  Transport into room to take patient to HD.  Spoke with nephrology regarding patient baseline from a dialysis and nephrology standpoint.  PMT will follow.    SUMMARY OF RECOMMENDATIONS   PMT will follow  Prognosis:  Unable to determine       Primary Diagnoses: Present on Admission:  Acute respiratory failure with hypoxia (HCC)  Essential hypertension  GERD without esophagitis  Coronary artery disease   I have reviewed the medical record, interviewed the patient and family, and examined the patient. The following aspects are pertinent.  Past Medical History:  Diagnosis Date   Aortic atherosclerosis (HCC)    Bilateral carotid artery disease (HCC)    Bladder cancer (HCC)    Coronary artery disease 12/20/2018   a.) LHC 12/20/2018: 50% OM1, 40% OM2, 95% o-pLAD, 75% o=pLCx, 40% mLM, 70% D1, 60% mRCA-1, 50% mRCA-2; refer to CVTS. b.) 4v CABG at [facility] on 12/27/2018: LIMA-LAD, RIMA-PDA, seg LRA-OM1-D1   DCM (dilated cardiomyopathy) (HCC) 12/05/2018   a.) TTE 12/05/2018: EF 40-45%. b.) TTE  12/28/2019: EF 20-25%.   Dyspnea 10/15/2022   ESRD (end stage renal disease) (HCC)    a.) T-Th-Sat   HFrEF (heart failure with reduced ejection fraction) (HCC) 12/05/2018   a.) TTE 12/05/2018: EF 40-45%; mild LVH; ant/apical/sep HK; mild TR . b.) TTE 12/28/2019: EF 20-25%; mod LVH; mod MR/AR; G1DD.   History of 2019 novel coronavirus disease (COVID-19) 04/08/2021   History of kidney stones    HLD (hyperlipidemia)    Hx of CABG 12/27/2018   Hypertension    Infrarenal abdominal aortic aneurysm (AAA) without rupture (HCC) 03/05/2021   a.) CT abd/pelvis; measured 3.2 cm.   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   a.) LIMA-LAD, RIMA-PDA, sequential LEFT radial artery to OM1 and D1   Sepsis (HCC) 03/14/2021    Wears glasses    Social History   Socioeconomic History   Marital status: Married    Spouse name: Not on file   Number of children: Not on file   Years of education: Not on file   Highest education level: Not on file  Occupational History   Not on file  Tobacco Use   Smoking status: Every Day    Current packs/day: 0.00    Types: Cigarettes    Last attempt to quit: 02/19/2019    Years since quitting: 3.7   Smokeless tobacco: Never  Vaping Use   Vaping status: Never Used  Substance and Sexual Activity   Alcohol use: Never   Drug use: Never   Sexual activity: Yes  Other Topics Concern   Not on file  Social History Narrative   Not on file   Social Determinants of Health   Financial Resource Strain: Low Risk  (08/23/2022)   Overall Financial Resource Strain (CARDIA)    Difficulty of Paying Living Expenses: Not very hard  Food Insecurity: Food Insecurity Present (11/16/2022)   Hunger Vital Sign    Worried About Running Out of Food in the Last Year: Often true    Ran Out of Food in the Last Year: Often true  Transportation Needs: Unmet Transportation Needs (11/16/2022)   PRAPARE - Administrator, Civil Service (Medical): Yes    Lack of Transportation (Non-Medical): Yes  Physical Activity: Inactive (08/23/2022)   Exercise Vital Sign    Days of Exercise per Week: 0 days    Minutes of Exercise per Session: 0 min  Stress: No Stress Concern Present (08/23/2022)   [employer] of Occupational Health - Occupational Stress Questionnaire    Feeling of Stress : Not at all  Social Connections: Moderately Integrated (08/23/2022)   Social Connection and Isolation Panel [NHANES]    Frequency of Communication with Friends and Family: More than three times a week    Frequency of Social Gatherings with Friends and Family: Never    Attends Religious Services: More than 4 times per year    Active Member of [employer] or Organizations: No    Attends Engineer, structural:  Never    Marital Status: Married   Family History  Problem Relation Age of Onset   Heart failure Mother    Scheduled Meds:  amLODipine  2.5 mg Oral Daily   apixaban  5 mg Oral BID   ascorbic acid  500 mg Oral Daily   aspirin EC  81 mg  Oral Daily   calcium acetate  667 mg Oral TID WC   Chlorhexidine Gluconate Cloth  6 each Topical Q0600   feeding supplement (NEPRO CARB STEADY)  237 mL Oral BID BM   furosemide  80 mg Intravenous BID   guaiFENesin  600 mg Oral BID   ipratropium-albuterol  3 mL Nebulization QID   isosorbide dinitrate  10 mg Oral TID   metoprolol succinate  25 mg Oral Daily   multivitamin  1 tablet Oral QHS   pantoprazole  40 mg Oral Daily   predniSONE  40 mg Oral Q breakfast   zinc sulfate  220 mg Oral Daily   Continuous Infusions:  anticoagulant sodium citrate     cefTRIAXone (ROCEPHIN)  IV 2 g (11/17/22 0441)   nitroGLYCERIN Stopped (11/16/22 1302)   PRN Meds:.acetaminophen **OR** acetaminophen, alteplase, anticoagulant sodium citrate, benzonatate, chlorpheniramine-HYDROcodone, feeding supplement (NEPRO CARB STEADY), heparin, lidocaine (PF), lidocaine-prilocaine, magnesium hydroxide, ondansetron **OR** ondansetron (ZOFRAN) IV, pentafluoroprop-tetrafluoroeth, traZODone Medications Prior to Admission:  Prior to Admission medications   Medication Sig Start Date End Date Taking? Authorizing Provider  amLODipine (NORVASC) 2.5 MG tablet Take 1 tablet (2.5 mg total) by mouth daily. Patient not taking: Reported on 11/16/2022 05/07/22   Alford Highland, MD  apixaban (ELIQUIS) 5 MG TABS tablet Take 1 tablet (5 mg total) by mouth 2 (two) times daily. Patient not taking: Reported on 11/16/2022 05/01/22   Baron Hamper, MD  ascorbic acid (VITAMIN C) 250 MG tablet Take 2 tablets (500 mg total) by mouth daily. Patient not taking: Reported on 11/16/2022 10/16/22   Arnetha Courser, MD  aspirin EC 81 MG tablet Take 81 mg by mouth daily. Patient not taking: Reported on 10/15/2022 05/06/20    [provider]  benzonatate (TESSALON PERLES) 100 MG capsule Take 1 capsule (100 mg total) by mouth 3 (three) times daily as needed for cough. Patient not taking: Reported on 11/16/2022 10/15/22 10/15/23  Arnetha Courser, MD  calcium acetate (PHOSLO) 667 MG capsule Take 1 capsule (667 mg total) by mouth 3 (three) times daily with meals. Patient not taking: Reported on 11/16/2022 11/18/21   Sunnie Nielsen, DO  guaiFENesin (MUCINEX) 600 MG 12 hr tablet Take 1 tablet (600 mg total) by mouth 2 (two) times daily. Patient not taking: Reported on 11/16/2022 10/15/22   Arnetha Courser, MD  isosorbide dinitrate (ISORDIL) 10 MG tablet Take 1 tablet (10 mg total) by mouth 3 (three) times daily. Patient not taking: Reported on 11/16/2022 10/15/22   Arnetha Courser, MD  metoprolol succinate (TOPROL-XL) 25 MG 24 hr tablet Take 25 mg by mouth daily. Patient not taking: Reported on 11/16/2022 03/12/22   [provider]  ondansetron (ZOFRAN-ODT) 4 MG disintegrating tablet Take 1 tablet (4 mg total) by mouth every 8 (eight) hours as needed for nausea or vomiting. Patient not taking: Reported on 11/16/2022 06/25/22   Simmons-Robinson, [name], MD  pantoprazole (PROTONIX) 40 MG tablet Take 1 tablet (40 mg total) by mouth daily. 05/07/22 06/06/22  Alford Highland, MD  zinc sulfate 220 (50 Zn) MG capsule Take 1 capsule (220 mg total) by mouth daily. Patient not taking: Reported on 11/16/2022 10/16/22   Arnetha Courser, MD   No Known Allergies Review of Systems  Physical Exam  Vital Signs: BP 105/70 (BP Location: Right Arm)   Pulse 85   Temp 98.4 F (36.9 C) (Oral)   Resp 16   Ht 5\' 2"  (1.575 m)   Wt 54.1 kg   SpO2 93%   BMI 21.81  kg/m  Pain Scale: 0-10   Pain Score: 0-No pain   SpO2: SpO2: 93 % O2 Device:SpO2: 93 % O2 Flow Rate: .   IO: Intake/output summary:  Intake/Output Summary (Last 24 hours) at 11/17/2022 1120 Last data filed at 11/17/2022 0900 Gross per 24 hour  Intake 1540 ml  Output --   Net 1540 ml    LBM: Last BM Date : 11/17/22 Baseline Weight: Weight: 59.9 kg Most recent weight: Weight: 54.1 kg       Signed by: Morton Stall, NP   Please contact Palliative Medicine Team phone at [phone] for questions and concerns.  For individual provider: See Loretha Stapler

## 2022-11-17 NOTE — Discharge Summary (Addendum)
Physician Discharge Summary   Patient: Anthony Hess MRN: [id number] DOB: 1955-10-04  Admit date:     11/16/2022  Discharge date: 11/17/22  Discharge Physician: Lurene Shadow   PCP: Ronnald Ramp, MD   Recommendations at discharge:   Follow-up with PCP in 1 week Continue outpatient hemodialysis on Tuesdays, Thursdays and Saturdays.  Discharge Diagnoses: Principal Problem:   Acute respiratory failure with hypoxia (HCC) Active Problems:   COPD with acute exacerbation (HCC)   ESRD on hemodialysis (HCC)   Essential hypertension   Paroxysmal atrial fibrillation (HCC)   GERD without esophagitis   Coronary artery disease  Resolved Problems:   * No resolved hospital problems. *  Hospital Course:  Anthony Hess is a 67 y.o. male with medical history significant for ESRD on HD TTS, CAD, status post CABG, paroxysmal atrial fibrillation, history of pulmonary embolism, tobacco use disorder, PVD, hyperlipidemia, long history of medical nonadherence, who presented to the emergency department because of acute onset of shortness of breath.   He was admitted to the hospital for acute hypoxemic respiratory failure likely due to fluid overload from ESRD, acute on chronic diastolic CHF and hypertensive emergency.  He initially required BiPAP for acute hypoxemic respiratory failure and increased work of breathing.  He underwent urgent hemodialysis and he was treated with IV Lasix and antihypertensives.  He was also treated with steroids, empiric antibiotics and bronchodilators because of concern for COPD exacerbation.  His condition has improved significantly.  He is tolerating room air.  He had hyperkalemia on admission but this has resolved.  He has hyperphosphatemia and hypocalcemia from ESRD 2D echo in July 2024 showed EF estimated at 55 to 60%, grade 1 diastolic dysfunction.   He has a long history of medical nonadherence.  His wife was frustrated about his behavior and she said she had  tried everything but patient is still not medically adherent.  She said patient refused to take his medicines and ate what ever he wanted.  She said sometimes he eats pizza and can even eat 12 donuts.  He continues to smoke about 2 packs of cigarettes a day.  The importance of medical adherence was discussed with the patient and he understands that he is at high risk of readmission and high risk of death with medical nonadherence.       Consultants: Nephrologist Procedures performed: Hemodialysis Disposition: Home Diet recommendation:  Discharge Diet Orders (From admission, onward)     Start     Ordered   11/17/22 0000  Diet renal with fluid restriction        11/17/22 1638           Renal diet DISCHARGE MEDICATION: Allergies as of 11/17/2022   No Known Allergies      Medication List     STOP taking these medications    ascorbic acid 250 MG tablet Commonly known as: VITAMIN C   benzonatate 100 MG capsule Commonly known as: Tessalon Perles   guaiFENesin 600 MG 12 hr tablet Commonly known as: MUCINEX   ondansetron 4 MG disintegrating tablet Commonly known as: ZOFRAN-ODT   zinc sulfate 220 (50 Zn) MG capsule       TAKE these medications    amLODipine 2.5 MG tablet Commonly known as: NORVASC Take 1 tablet (2.5 mg total) by mouth daily.   apixaban 5 MG Tabs tablet Commonly known as: ELIQUIS Take 1 tablet (5 mg total) by mouth 2 (two) times daily.   aspirin EC 81 MG tablet Take 81 mg  by mouth daily.   calcium acetate 667 MG capsule Commonly known as: PHOSLO Take 1 capsule (667 mg total) by mouth 3 (three) times daily with meals.   isosorbide dinitrate 10 MG tablet Commonly known as: ISORDIL Take 1 tablet (10 mg total) by mouth 3 (three) times daily.   metoprolol succinate 25 MG 24 hr tablet Commonly known as: TOPROL-XL Take 25 mg by mouth daily.   pantoprazole 40 MG tablet Commonly known as: Protonix Take 1 tablet (40 mg total) by mouth daily.    predniSONE 20 MG tablet Commonly known as: DELTASONE Take 2 tablets (40 mg total) by mouth daily with breakfast for 3 days. Start taking on: November 18, 2022        Discharge Exam: Anthony Hess Weights   11/16/22 1820 11/17/22 0333 11/17/22 1012  Weight: 53.7 kg 53.1 kg 54.1 kg   GEN: NAD SKIN: Warm and dry EYES: No pallor or icterus ENT: MMM CV: RRR PULM: CTA B ABD: soft, ND, NT, +BS CNS: AAO x 3, non focal EXT: No edema or tenderness   Condition at discharge: good  The results of significant diagnostics from this hospitalization (including imaging, microbiology, ancillary and laboratory) are listed below for reference.   Imaging Studies: DG Chest Port 1 View  Result Date: 11/16/2022 CLINICAL DATA:  Shortness of breath EXAM: PORTABLE CHEST 1 VIEW COMPARISON:  10/21/2022 FINDINGS: Cardiomegaly. Prior median sternotomy for CABG with stable contours. Dialysis catheter on the right with tip at the upper right atrium. Few Kerley lines are noted, especially on the right. No effusion or airspace disease. IMPRESSION: Possible, minimal interstitial edema. Electronically Signed   By: Tiburcio Pea M.D.   On: 11/16/2022 04:52   PERIPHERAL VASCULAR CATHETERIZATION  Result Date: 10/22/2022 See surgical note for result.  DG Chest 2 View  Result Date: 10/21/2022 CLINICAL DATA:  End-stage renal disease, missed dialysis treatment EXAM: CHEST - 2 VIEW COMPARISON:  10/14/2022 FINDINGS: Frontal and lateral views of the chest demonstrates stable right internal jugular dialysis catheter. The cardiac silhouette is unremarkable. No airspace disease, effusion, or pneumothorax. No acute bony abnormalities. IMPRESSION: 1. No acute intrathoracic process. Electronically Signed   By: Sharlet Salina M.D.   On: 10/21/2022 22:06    Microbiology: Results for orders placed or performed during the hospital encounter of 10/14/22  SARS Coronavirus 2 by RT PCR (hospital order, performed in [facility] hospital  lab) *cepheid single result test* Anterior Nasal Swab     Status: Abnormal   Collection Time: 10/14/22 11:12 PM   Specimen: Anterior Nasal Swab  Result Value Ref Range Status   SARS Coronavirus 2 by RT PCR POSITIVE (A) NEGATIVE Final    Comment: (NOTE) SARS-CoV-2 target nucleic acids are DETECTED  SARS-CoV-2 RNA is generally detectable in upper respiratory specimens  during the acute phase of infection.  Positive results are indicative  of the presence of the identified virus, but do not rule out bacterial infection or co-infection with other pathogens not detected by the test.  Clinical correlation with patient history and  other diagnostic information is necessary to determine patient infection status.  The expected result is negative.  Fact Sheet for Patients:   RoadLapTop.co.za   Fact Sheet for Healthcare Providers:   http://kim-miller.com/    This test is not yet approved or cleared by the [place] FDA and  has been authorized for detection and/or diagnosis of SARS-CoV-2 by FDA under an Emergency Use Authorization (EUA).  This EUA will remain in effect (meaning  this test can be used) for the duration of  the COVID-19 declaration under Section 564(b)(1)  of the Act, 21 U.S.C. section 360-bbb-3(b)(1), unless the authorization is terminated or revoked sooner.   Performed at [facility], [address] [place], [place] [address]     Labs: CBC: Recent Labs  Lab 11/16/22 0213 11/16/22 0600  WBC 12.8* 12.0*  NEUTROABS 9.0*  --   HGB 11.2* 10.2*  HCT 36.9* 32.4*  MCV 97.4 93.9  PLT 307 289   Basic Metabolic Panel: Recent Labs  Lab 11/16/22 0213 11/16/22 0600 11/17/22 0914  NA 138 138 138  K 5.9* 5.9* 4.0  CL 102 105 99  CO2 16* 11* 22  GLUCOSE 98 159* 177*  BUN 91* 91* 61*  CREATININE 9.45* 9.53* 6.65*  CALCIUM 5.8* 5.6* 6.6*  PHOS  --   --  9.4*   Liver Function Tests: Recent Labs  Lab  11/17/22 0914  ALBUMIN 3.3*   CBG: No results for input(s): "GLUCAP" in the last 168 hours.  Discharge time spent: greater than 30 minutes.  Signed: Lurene Shadow, MD [facility] 11/17/2022

## 2022-11-17 NOTE — TOC Transition Note (Signed)
Transition of Care [facility]) - CM/SW Discharge Note   Patient Details  Name: Anthony Hess MRN: [id number] Date of Birth: [birth year]  [facility]) CM/SW Contact:  Truddie Hidden, RN Phone Number: 11/17/2022, 5:02 PM   Clinical Narrative:    Per unit nurse patient needs transporation home. RNCM spoke with patient. He stated transportation was needed. He confirmed address.  Taxi voucher printed to the floor.    TOC signing off.       Barriers to Discharge: No Barriers Identified   Patient Goals and CMS Choice CMS Medicare.gov Compare Post Acute Care list provided to:: Patient Choice offered to / list presented to : Patient  Discharge Placement                         Discharge Plan and Services Additional resources added to the After Visit Summary for                                       Social Determinants of Health (SDOH) Interventions SDOH Screenings   Food Insecurity: Food Insecurity Present (11/16/2022)  Housing: Medium Risk (11/16/2022)  Transportation Needs: Unmet Transportation Needs (11/16/2022)  Utilities: At Risk (11/16/2022)  Depression (PHQ2-9): Low Risk  (08/23/2022)  Financial Resource Strain: Low Risk  (08/23/2022)  Physical Activity: Inactive (08/23/2022)  Social Connections: Moderately Integrated (08/23/2022)  Stress: No Stress Concern Present (08/23/2022)  Tobacco Use: High Risk (11/16/2022)     Readmission Risk Interventions    11/17/2022    9:38 AM 11/16/2022    1:01 PM 09/17/2022   10:26 AM  Readmission Risk Prevention Plan  Transportation Screening Complete Complete Complete  Medication Review Oceanographer) Complete Not Complete Complete  Med Review Comments  will complete with Rn upon discharge   PCP or Specialist appointment within 3-5 days of discharge Complete Not Complete Complete  PCP/Specialist Appt Not Complete comments pcp dr. simmons-robins SNF placement may be needed   HRI or Home Care Consult  Not Complete  Complete  SW Recovery Care/Counseling Consult  Not Complete Complete  SW Consult Not Complete Comments n/a    Palliative Care Screening Not Applicable Not Applicable Not Applicable  Skilled Nursing Facility Not Applicable Not Complete Complete  SNF Comments not applicable Pt waiting PT and OT evaluation

## 2022-11-17 NOTE — Progress Notes (Signed)
Received patient in bed to unit.    Informed consent signed and in chart.    TX duration: 3     Transported back to floor  Hand-off given to patient's nurse.   Access used:  rt chest cvc Access issues: n/a  Total UF removed: 2.4 liters Medication(s) given: n/a       Maple Hudson, RN Dialysis Unit

## 2022-11-17 NOTE — Progress Notes (Signed)
Initial Nutrition Assessment  DOCUMENTATION CODES:   Not applicable  INTERVENTION:   -Liberalize diet to 2 gram sodium for wider variety of meal selections -Renal MVI daily -Nepro Shake po BID, each supplement provides 425 kcal and 19 grams protein   NUTRITION DIAGNOSIS:   Increased nutrient needs related to chronic illness (ESRD on HD) as evidenced by estimated needs.  GOAL:   Patient will meet greater than or equal to 90% of their needs  MONITOR:   PO intake, Supplement acceptance  REASON FOR ASSESSMENT:   Consult Assessment of nutrition requirement/status  ASSESSMENT:   Pt with medical history significant for end-stage renal disease on hemodialysis on TTS, coronary artery disease, status post CABG, tobacco abuse, peripheral vascular disease, dyslipidemia and urolithiasis who presented with acute onset of worsening dyspnea since last night without significant cough or wheezing.  Pt admitted with COPD exacerbation and acute respiratory failure with hypoxia secondary to fluid overload in the setting of ESRD on HD.   Reviewed I/O's: +1.3 L x 24 hours and +1.4 L since admission  Pt unavailable at time of visit. Pt down in HD suite at time of visit. RD unable to obtain further nutrition-related history or complete nutrition-focused physical exam at this time.    Pt is currently on a heart healthy diet. Noted meal completions 100%.   Unsure of EDW, but per nephrology notes on 11/17/23, pt is about 5 kg over target weight. No wt loss noted over the past 3 months, however, suspect fluid overload may be masking true weight loss as well as fat and muscle depletions.   Palliative care team following for goals of care discussions. Per pt wife, pt is nonadherent to medications, diet and fluid restrictions, and missed HD sessions. He continues to smoke 2 packs per day.   Medications reviewed and include vitamin C, phoslo,lasix, and zinc sulfate.   Labs reviewed: CGS: 118.    Diet  Order:   Diet Order             Diet Heart Room service appropriate? Yes; Fluid consistency: Thin  Diet effective now                   EDUCATION NEEDS:   No education needs have been identified at this time  Skin:  Skin Assessment: Reviewed RN Assessment  Last BM:  11/17/22  Height:   Ht Readings from Last 1 Encounters:  11/16/22 5\' 2"  (1.575 m)    Weight:   Wt Readings from Last 1 Encounters:  11/17/22 54.1 kg    Ideal Body Weight:  53.6 kg  BMI:  Body mass index is 21.81 kg/m.  Estimated Nutritional Needs:   Kcal:  1650-1850  Protein:  85-100 grams  Fluid:  1000 ml + UOP    Levada Schilling, RD, LDN, CDCES Registered Dietitian II Certified Diabetes Care and Education Specialist Please refer to [facility] for RD and/or RD on-call/weekend/after hours pager

## 2022-11-17 NOTE — Plan of Care (Signed)
  Problem: Education: Goal: Understanding of cardiac disease, CV risk reduction, and recovery process will improve Outcome: Progressing Goal: Individualized Educational Video(s) Outcome: Progressing   Problem: Activity: Goal: Ability to tolerate increased activity will improve Outcome: Progressing   Problem: Cardiac: Goal: Ability to achieve and maintain adequate cardiovascular perfusion will improve Outcome: Progressing   Problem: Health Behavior/Discharge Planning: Goal: Ability to safely manage health-related needs after discharge will improve Outcome: Progressing   Problem: Education: Goal: Ability to demonstrate management of disease process will improve Outcome: Progressing Goal: Ability to verbalize understanding of medication therapies will improve Outcome: Progressing Goal: Individualized Educational Video(s) Outcome: Progressing   Problem: Activity: Goal: Capacity to carry out activities will improve Outcome: Progressing   Problem: Cardiac: Goal: Ability to achieve and maintain adequate cardiopulmonary perfusion will improve Outcome: Progressing   Problem: Education: Goal: Knowledge of General Education information will improve Description: Including pain rating scale, medication(s)/side effects and non-pharmacologic comfort measures Outcome: Progressing   Problem: Health Behavior/Discharge Planning: Goal: Ability to manage health-related needs will improve Outcome: Progressing   Problem: Clinical Measurements: Goal: Ability to maintain clinical measurements within normal limits will improve Outcome: Progressing Goal: Will remain free from infection Outcome: Progressing Goal: Diagnostic test results will improve Outcome: Progressing Goal: Respiratory complications will improve Outcome: Progressing Goal: Cardiovascular complication will be avoided Outcome: Progressing   Problem: Activity: Goal: Risk for activity intolerance will decrease Outcome:  Progressing   Problem: Nutrition: Goal: Adequate nutrition will be maintained Outcome: Progressing   Problem: Coping: Goal: Level of anxiety will decrease Outcome: Progressing   Problem: Elimination: Goal: Will not experience complications related to bowel motility Outcome: Progressing Goal: Will not experience complications related to urinary retention Outcome: Progressing   Problem: Pain Managment: Goal: General experience of comfort will improve Outcome: Progressing   Problem: Safety: Goal: Ability to remain free from injury will improve Outcome: Progressing   Problem: Skin Integrity: Goal: Risk for impaired skin integrity will decrease Outcome: Progressing

## 2022-11-17 NOTE — Progress Notes (Signed)
OT Cancellation Note  Patient Details Name: Anthony Hess MRN: [id number] DOB: Oct 05, 1955   Cancelled Treatment:    Reason Eval/Treat Not Completed: OT screened, no needs identified, will sign off. Order received, chart reviewed. Upon arrival, pt and RN reporting pt mobilizing independently in room as needed. Denies any therapy needs. Will sign off.   Kathie Dike, M.S. OTR/L  11/17/22, 9:08 AM  ascom [phone]

## 2022-11-17 NOTE — TOC Initial Note (Signed)
Transition of Care [facility]) - Initial/Assessment Note    Patient Details  Name: Quinlin Toppi MRN: [id number] Date of Birth: [birth year]  [facility]) CM/SW Contact:    Darolyn Rua, LCSW Phone Number: 11/17/2022, 9:39 AM  Clinical Narrative:                  Csw spoke with patient regarding discharge planning, PCP is Dr. [name], reports on discharge needs except needing a taxi voucher as he has no way home and no money to afford ride. Patient confirms he is going to [address] CSW has Paramedic for once medically ready.   No additional discharge needs.   Expected Discharge Plan: Home/Self Care Barriers to Discharge: No Barriers Identified   Patient Goals and CMS Choice Patient states their goals for this hospitalization and ongoing recovery are:: to go home CMS Medicare.gov Compare Post Acute Care list provided to:: Patient Choice offered to / list presented to : Patient      Expected Discharge Plan and Services                                              Prior Living Arrangements/Services                       Activities of Daily Living Home Assistive Devices/Equipment: Eyeglasses ADL Screening (condition at time of admission) Is the patient deaf or have difficulty hearing?: No Does the patient have difficulty seeing, even when wearing glasses/contacts?: No Does the patient have difficulty concentrating, remembering, or making decisions?: No  Permission Sought/Granted                  Emotional Assessment              Admission diagnosis:  Hypertensive crisis [I16.9] Respiratory distress [R06.03] Acute respiratory failure with hypoxia (HCC) [J96.01] Patient Active Problem List   Diagnosis Date Noted   COPD with acute exacerbation (HCC) 11/16/2022   Paroxysmal atrial fibrillation (HCC) 11/16/2022   Problem with vascular access 10/22/2022   Dyspnea 10/15/2022   GERD without  esophagitis 10/15/2022   ESRD on hemodialysis (HCC) 09/17/2022   Acute on chronic diastolic CHF (congestive heart failure) (HCC) 09/17/2022   History of pulmonary embolism 09/17/2022   Coronary artery disease 09/17/2022   Essential hypertension 09/17/2022   Acute respiratory failure with hypoxia (HCC) 09/06/2022   Fluid overload 09/06/2022   Hypertensive emergency 09/06/2022   HLD (hyperlipidemia) 09/06/2022   CAD (coronary artery disease) 09/06/2022   Pulmonary embolism (HCC) 09/06/2022   NSTEMI (non-ST elevated myocardial infarction) (HCC) 08/17/2022   (HFpEF) heart failure with preserved ejection fraction (HCC) 08/17/2022   Seasonal allergic rhinitis due to pollen 06/25/2022   Nausea 06/25/2022   Hoarseness of voice 06/25/2022   S/P exploratory laparotomy for perforated duodenum with admission 04/26/22 05/04/2022   Postprocedural intraabdominal abscess 05/04/2022   Acute pulmonary embolism (HCC) 04/19/2022   Dyslipidemia 04/19/2022   End-stage renal disease on hemodialysis (HCC) 04/19/2022   Tobacco dependence 04/19/2022   Chronic systolic heart failure (HCC) 04/05/2022   Hypertensive urgency 11/16/2021   Hyperkalemia 11/16/2021   Chronic back pain 11/16/2021   Hypertension secondary to other renal disorders 10/15/2021   Vitamin B12 deficiency anemia due to intrinsic factor deficiency 06/23/2021   Abdominal pain    SBP (spontaneous bacterial  peritonitis) (HCC) 04/08/2021   COVID-19 04/08/2021   AAA (abdominal aortic aneurysm) (HCC) 03/18/2021   Hypocalcemia    Leukocytosis 03/05/2021   Spontaneous bacterial peritonitis (HCC) 03/04/2021   Incisional hernia, without obstruction or gangrene    Generalized (acute) peritonitis (HCC) 12/10/2020   Iron deficiency anemia, unspecified 05/06/2020   Moderate protein-calorie malnutrition (HCC) 05/06/2020   Other long term (current) drug therapy 05/06/2020   Unspecified jaundice 05/06/2020   PD catheter dysfunction (HCC) 04/13/2020    Bilateral carotid artery stenosis 02/29/2020   Coronary artery disease involving coronary bypass graft of native heart 02/29/2020   Secondary hyperparathyroidism of renal origin (HCC) 01/30/2020   Acute peritonitis (HCC) 01/08/2020   Hyperlipidemia, mixed 01/04/2020   Mass of left side of neck 01/04/2020   Senile purpura (HCC) 01/04/2020   Hydroureteronephrosis    Atrial fibrillation, chronic (HCC) 01/05/2019   End stage renal disease (HCC) 01/05/2019   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), [place] - [address] [place]) [place] [address] Phone: [phone] Fax: [phone]     Social Determinants of Health (SDOH) Social History: SDOH Screenings   Food Insecurity: Food Insecurity Present (11/16/2022)  Housing: Medium Risk (11/16/2022)  Transportation Needs: Unmet Transportation Needs (11/16/2022)  Utilities: At Risk (11/16/2022)  Depression (PHQ2-9): Low Risk  (08/23/2022)  Financial Resource Strain: Low Risk  (08/23/2022)  Physical Activity: Inactive (08/23/2022)  Social Connections: Moderately Integrated (08/23/2022)  Stress: No Stress Concern Present (08/23/2022)  Tobacco Use: High Risk (11/16/2022)   SDOH Interventions:     Readmission Risk Interventions    11/17/2022    9:38 AM 11/16/2022    1:01 PM 09/17/2022   10:26 AM  Readmission Risk Prevention Plan  Transportation Screening Complete Complete Complete  Medication Review Oceanographer) Complete Not Complete Complete  Med Review Comments  will complete with Rn upon discharge   PCP or Specialist appointment within 3-5 days of discharge Complete Not Complete  Complete  PCP/Specialist Appt Not Complete comments pcp dr. simmons-robins SNF placement may be needed   HRI or Home Care Consult  Not Complete Complete  SW Recovery Care/Counseling Consult  Not Complete Complete  SW Consult Not Complete Comments n/a    Palliative Care Screening Not Applicable Not Applicable Not Applicable  Skilled Nursing Facility Not Applicable Not Complete Complete  SNF Comments not applicable Pt waiting PT and OT evaluation

## 2022-11-17 NOTE — Progress Notes (Signed)
PT Cancellation Note  Patient Details Name: Ellison Sachdeva MRN: [id number] DOB: 1955-04-14   Cancelled Treatment:    Reason Eval/Treat Not Completed: PT screened, no needs identified, will sign off. Pt reports no need for PT at this time secondary to being independent with all mobility. Will sign off from skilled PT needs.   Luzmaria Devaux 11/17/2022, 9:08 AM

## 2022-11-18 ENCOUNTER — Telehealth: Payer: Self-pay

## 2022-11-18 NOTE — Transitions of Care (Post Inpatient/ED Visit) (Signed)
11/18/2022  Name: Anthony Hess MRN: [id number] DOB: Jul 25, 1955  Today's TOC FU Call Status: Today's TOC FU Call Status:: Successful TOC FU Call Completed TOC FU Call Complete Date: 11/18/22 Patient's Name and Date of Birth confirmed.  Transition Care Management Follow-up Telephone Call Date of Discharge: 11/17/22 Discharge Facility: [facility]) Type of Discharge: Inpatient Admission Primary Inpatient Discharge Diagnosis:: acute respiratory distress How have you been since you were released from the hospital?: Better Any questions or concerns?: No  Items Reviewed: Did you receive and understand the discharge instructions provided?: Yes Medications obtained,verified, and reconciled?: Yes (Medications Reviewed) Any new allergies since your discharge?: No Dietary orders reviewed?: Yes Do you have support at home?: Yes People in Home: spouse  Medications Reviewed Today: Medications Reviewed Today     Reviewed by Karena Addison, LPN (Licensed Practical Nurse) on 11/18/22 at [phone]  Med List Status: <None>   Medication Order Taking? Sig Documenting Provider Last Dose Status Informant  amLODipine (NORVASC) 2.5 MG tablet [id number] No Take 1 tablet (2.5 mg total) by mouth daily.  Patient not taking: Reported on 11/16/2022   Alford Highland, MD Not Taking Active Pharmacy Records, Spouse/Significant Other  apixaban (ELIQUIS) 5 MG TABS tablet [id number] No Take 1 tablet (5 mg total) by mouth 2 (two) times daily.  Patient not taking: Reported on 11/16/2022   Baron Hamper, MD Not Taking Active Pharmacy Records, Spouse/Significant Other  aspirin EC 81 MG tablet [id number] No Take 81 mg by mouth daily.  Patient not taking: Reported on 10/15/2022   [provider] Not Taking Active Pharmacy Records, Spouse/Significant Other  calcium acetate (PHOSLO) 667 MG capsule [id number] No Take 1 capsule (667 mg total) by mouth 3 (three) times daily with meals.  Patient  not taking: Reported on 11/16/2022   Sunnie Nielsen, DO Not Taking Active Pharmacy Records, Spouse/Significant Other  isosorbide dinitrate (ISORDIL) 10 MG tablet [id number] No Take 1 tablet (10 mg total) by mouth 3 (three) times daily.  Patient not taking: Reported on 11/16/2022   Arnetha Courser, MD Not Taking Active Spouse/Significant Other, Pharmacy Records  metoprolol succinate (TOPROL-XL) 25 MG 24 hr tablet [id number] No Take 25 mg by mouth daily.  Patient not taking: Reported on 11/16/2022   [provider] Not Taking Active Pharmacy Records, Spouse/Significant Other  pantoprazole (PROTONIX) 40 MG tablet [id number]  Take 1 tablet (40 mg total) by mouth daily. Alford Highland, MD  Expired 06/06/22 2359   predniSONE (DELTASONE) 20 MG tablet [id number]  Take 2 tablets (40 mg total) by mouth daily with breakfast for 3 days. Lurene Shadow, MD  Active   Med List Note Sharia Reeve, CPhT 11/16/22 1422): Patient's wife stated "HE IS NON-COMPLIANT WITH MEDICATIONS"            Home Care and Equipment/Supplies: Were Home Health Services Ordered?: NA Any new equipment or medical supplies ordered?: NA  Functional Questionnaire: Do you need assistance with bathing/showering or dressing?: No Do you need assistance with meal preparation?: No Do you need assistance with eating?: No Do you have difficulty maintaining continence: No Do you need assistance with getting out of bed/getting out of a chair/moving?: No Do you have difficulty managing or taking your medications?: No  Follow up appointments reviewed: PCP Follow-up appointment confirmed?: Yes Date of PCP follow-up appointment?: 11/30/22 Follow-up Provider: [facility] Follow-up appointment confirmed?: Yes Date of Specialist follow-up appointment?: 11/23/22 Follow-Up Specialty Provider:: vascular Do you need transportation to your follow-up appointment?: No Do you  understand care options if your  condition(s) worsen?: Yes-patient verbalized understanding    SIGNATURE Karena Addison, LPN [facility] Nurse Health Advisor Direct Dial [phone]

## 2022-11-19 ENCOUNTER — Telehealth: Payer: Self-pay | Admitting: *Deleted

## 2022-11-19 NOTE — Transitions of Care (Post Inpatient/ED Visit) (Signed)
11/19/2022  Name: Anthony Hess MRN: [id number] DOB: [birth year]  Today's TOC FU Call Status: Today's TOC FU Call Status:: Unsuccessful Call (1st Attempt) Unsuccessful Call (1st Attempt) Date: 11/19/22  Attempted to reach the patient regarding the most recent Inpatient visit;  Received automated outgoing voice message stating "your call annot be completed at this time;" unable to leave voice message requesting call-back   Follow Up Plan: Additional outreach attempts will be made to reach the patient to complete the Transitions of Care (Post Inpatient visit) call.   Caryl Pina, RN, BSN, CCRN Alumnus RN CM Care Coordination/ Transition of Care- [facility] Care Management [phone]: direct office

## 2022-11-22 ENCOUNTER — Telehealth: Payer: Self-pay | Admitting: *Deleted

## 2022-11-22 NOTE — Transitions of Care (Post Inpatient/ED Visit) (Signed)
11/22/2022  Name: Robt Gunnell MRN: [id number] DOB: July 29, 1955  Today's TOC FU Call Status: Today's TOC FU Call Status:: Unsuccessful Call (2nd Attempt) Unsuccessful Call (2nd Attempt) Date: 11/22/22  Attempted to reach the patient regarding the most recent Inpatient/ED visit.  Follow Up Plan: Additional outreach attempts will be made to reach the patient to complete the Transitions of Care (Post Inpatient/ED visit) call.   Gean Maidens BSN RN [facility] Care Management [phone]

## 2022-11-23 ENCOUNTER — Ambulatory Visit (INDEPENDENT_AMBULATORY_CARE_PROVIDER_SITE_OTHER): Payer: Medicare Other | Admitting: Nurse Practitioner

## 2022-11-23 ENCOUNTER — Encounter (INDEPENDENT_AMBULATORY_CARE_PROVIDER_SITE_OTHER): Payer: Medicare Other

## 2022-11-23 ENCOUNTER — Encounter (INDEPENDENT_AMBULATORY_CARE_PROVIDER_SITE_OTHER): Payer: Self-pay | Admitting: Nurse Practitioner

## 2022-11-23 VITALS — BP 114/82 | HR 85 | Resp 18 | Ht 62.0 in | Wt 114.0 lb

## 2022-11-23 DIAGNOSIS — N186 End stage renal disease: Secondary | ICD-10-CM | POA: Diagnosis not present

## 2022-11-23 DIAGNOSIS — E782 Mixed hyperlipidemia: Secondary | ICD-10-CM

## 2022-11-23 DIAGNOSIS — I10 Essential (primary) hypertension: Secondary | ICD-10-CM

## 2022-11-23 DIAGNOSIS — M79604 Pain in right leg: Secondary | ICD-10-CM

## 2022-11-23 NOTE — Progress Notes (Incomplete)
Subjective:    Patient ID: Anthony Hess, male    DOB: 03-29-1955, 67 y.o.   MRN: [id number] Chief Complaint  Patient presents with  . Venous Insufficiency    Anthony Hess is a 67 year old male who presents today for follow-up evaluation after recent PermCath exchange.  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  He denies any signs symptoms of infection currently.  The patient notes that this time he is not interested in having an additional upper extremity access as he did before and would prefer to remain with his PermCath.  Additionally, he complains of pain with walking in his right lower extremity.  He notes that it has been ongoing for the last year or so.  He notes that he is able to walk for short distances and then he is to stop and rest before being able to walk a little bit further.  He denies any rest pain.  He denies any open wounds or ulcerations on the right lower extremity.    Review of Systems  Cardiovascular:        Claudication  Musculoskeletal:  Negative for back pain.  All other systems reviewed and are negative.      Objective:   Physical Exam Vitals reviewed.  HENT:     Head: Normocephalic.  Cardiovascular:     Rate and Rhythm: Normal rate.  Skin:    General: Skin is warm and dry.  Neurological:     Mental Status: He is alert and oriented to person, place, and time.  Psychiatric:        Mood and Affect: Mood normal.        Behavior: Behavior normal.        Thought Content: Thought content normal.        Judgment: Judgment normal.     BP 114/82 (BP Location: Left Arm)   Pulse 85   Resp 18   Ht 5\' 2"  (1.575 m)   Wt 114 lb (51.7 kg)   BMI 20.85 kg/m   Past Medical History:  Diagnosis Date  . Aortic atherosclerosis (HCC)   . Bilateral carotid artery disease (HCC)   . Bladder cancer (HCC)   . Coronary artery disease 12/20/2018   a.) LHC 12/20/2018: 50% OM1, 40% OM2, 95%  o-pLAD, 75% o=pLCx, 40% mLM, 70% D1, 60% mRCA-1, 50% mRCA-2; refer to CVTS. b.) 4v CABG at [facility] on 12/27/2018: LIMA-LAD, RIMA-PDA, seg LRA-OM1-D1  . DCM (dilated cardiomyopathy) (HCC) 12/05/2018   a.) TTE 12/05/2018: EF 40-45%. b.) TTE 12/28/2019: EF 20-25%.  Marland Kitchen Dyspnea 10/15/2022  . ESRD (end stage renal disease) (HCC)    a.) T-Th-Sat  . HFrEF (heart failure with reduced ejection fraction) (HCC) 12/05/2018   a.) TTE 12/05/2018: EF 40-45%; mild LVH; ant/apical/sep HK; mild TR . b.) TTE 12/28/2019: EF 20-25%; mod LVH; mod MR/AR; G1DD.  Marland Kitchen History of 2019 novel coronavirus disease (COVID-19) 04/08/2021  . History of kidney stones   . HLD (hyperlipidemia)   . Hx of CABG 12/27/2018  . Hypertension   . Infrarenal abdominal aortic aneurysm (AAA) without rupture (HCC) 03/05/2021   a.) CT abd/pelvis; measured 3.2 cm.  . Melena 05/04/2022  . Myocardial infarction (HCC)   . NSTEMI (non-ST elevated myocardial infarction) (HCC) 04/19/2022  . Perforation bowel (HCC) 04/26/2022  . PVD (peripheral vascular disease) (HCC)   . 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 Sepsis (HCC) 03/14/2021  . Wears glasses     Social History   Socioeconomic History  . Marital status: Married    Spouse name: Not on file  . Number of children: Not on file  . Years of education: Not on file  . Highest education level: Not on file  Occupational History  . Not on file  Tobacco Use  . Smoking status: Every Day    Current packs/day: 0.00    Types: Cigarettes    Last attempt to quit: 02/19/2019    Years since quitting: 3.7  . Smokeless tobacco: Never  Vaping Use  . Vaping status: Never Used  Substance and Sexual Activity  . Alcohol use: Never  . Drug use: Never  . Sexual activity: Yes  Other Topics Concern  . Not on file  Social History Narrative  . Not on file   Social Determinants of Health   Financial Resource Strain: Low Risk  (08/23/2022)    Overall Financial Resource Strain (CARDIA)   . Difficulty of Paying Living Expenses: Not very hard  Food Insecurity: Food Insecurity Present (11/16/2022)   Hunger Vital Sign   . Worried About Programme researcher, broadcasting/film/video in the Last Year: Often true   . Ran Out of Food in the Last Year: Often true  Transportation Needs: Unmet Transportation Needs (11/16/2022)   PRAPARE - Transportation   . Lack of Transportation (Medical): Yes   . Lack of Transportation (Non-Medical): Yes  Physical Activity: Inactive (08/23/2022)   Exercise Vital Sign   . Days of Exercise per Week: 0 days   . Minutes of Exercise per Session: 0 min  Stress: No Stress Concern Present (08/23/2022)   [employer] of Occupational Health - Occupational Stress Questionnaire   . Feeling of Stress : Not at all  Social Connections: Moderately Integrated (08/23/2022)   Social Connection and Isolation Panel [NHANES]   . Frequency of Communication with Friends and Family: More than three times a week   . Frequency of Social Gatherings with Friends and Family: Never   . Attends Religious Services: More than 4 times per year   . Active Member of Clubs or Organizations: No   . Attends Banker Meetings: Never   . Marital Status: Married  Catering manager Violence: Not At Risk (11/16/2022)   Humiliation, Afraid, Rape, and Kick questionnaire   . Fear of Current or Ex-Partner: No   . Emotionally Abused: No   . Physically Abused: No   . Sexually Abused: No    Past Surgical History:  Procedure Laterality Date  . AV FISTULA PLACEMENT Left 07/30/2021   Procedure: INSERTION OF ARTERIOVENOUS (AV) GORE-TEX GRAFT ARM BRACHIAL ARTERY TO AXILLARY VEIN;  Surgeon: Annice Needy, MD;  Location: [facility];  Service: Vascular;  Laterality: Left;  . CAPD INSERTION N/A 12/31/2019   Procedure: LAPAROSCOPIC INSERTION CONTINUOUS AMBULATORY PERITONEAL DIALYSIS  (CAPD) CATHETER;  Surgeon: Leafy Ro, MD;  Location: [facility];  Service: General;   Laterality: N/A;  . CAPD REMOVAL N/A 04/10/2020   Procedure: LAPAROSCOPIC REVISION OF CONTINUOUS AMBULATORY PERITONEAL DIALYSIS  (CAPD) CATHETER;  Surgeon: Leafy Ro, MD;  Location: [facility];  Service: General;  Laterality: N/A;  . CORONARY ARTERY BYPASS GRAFT N/A 12/27/2018   Procedure: CORONARY ARTERY BYPASS GRAFTING (CABG) X 4 ON PUMP USING RIGHT & LEFT INTERNAL MAMMARY ARTERY LEFT RADIAL ARTERY ENDOSCOPICALLY HARVESTED;  Surgeon: Linden Dolin, MD;  Location: [facility];  Service: Open Heart Surgery;  Laterality: N/A;  . CYSTOSCOPY W/ RETROGRADES Bilateral 05/15/2019   Procedure: CYSTOSCOPY WITH RETROGRADE PYELOGRAM;  Surgeon: Riki Altes, MD;  Location: [facility];  Service: Urology;  Laterality: Bilateral;  . CYSTOSCOPY WITH BIOPSY N/A 05/15/2019   Procedure: CYSTOSCOPY WITH bladder BIOPSY;  Surgeon: Riki Altes, MD;  Location: [facility];  Service: Urology;  Laterality: N/A;  . DIALYSIS/PERMA CATHETER INSERTION N/A 12/28/2019   Procedure: DIALYSIS/PERMA CATHETER INSERTION;  Surgeon: Annice Needy, MD;  Location: [facility];  Service: Cardiovascular;  Laterality: N/A;  . DIALYSIS/PERMA CATHETER INSERTION N/A 03/18/2021   Procedure: DIALYSIS/PERMA CATHETER INSERTION;  Surgeon: Annice Needy, MD;  Location: [facility];  Service: Cardiovascular;  Laterality: N/A;  . DIALYSIS/PERMA CATHETER REMOVAL N/A 06/02/2020   Procedure: DIALYSIS/PERMA CATHETER REMOVAL;  Surgeon: Annice Needy, MD;  Location: [facility];  Service: Cardiovascular;  Laterality: N/A;  . DIALYSIS/PERMA CATHETER REPAIR N/A 10/22/2022   Procedure: DIALYSIS/PERMA CATHETER REPAIR;  Surgeon: Renford Dills, MD;  Location: [facility];  Service: Cardiovascular;  Laterality: N/A;  . EXCHANGE OF A DIALYSIS CATHETER Right 04/10/2020   Procedure: EXCHANGE OF A DIALYSIS CATHETER;  Surgeon: Leafy Ro, MD;  Location: [facility];  Service: General;  Laterality: Right;  . INCISIONAL HERNIA REPAIR   01/20/2021   Procedure: HERNIA REPAIR INCISIONAL;  Surgeon: Henrene Dodge, MD;  Location: [facility];  Service: General;;  . IR IMAGE GUIDED DRAINAGE PERCUT CATH  PERITONEAL RETROPERIT  04/07/2020  . LAPAROSCOPY N/A 04/16/2021   Procedure: LAPAROSCOPY DIAGNOSTIC;  Surgeon: Sung Amabile, DO;  Location: [facility];  Service: General;  Laterality: N/A;  . LAPAROTOMY N/A 04/26/2022   Procedure: EXPLORATORY LAPAROTOMY WITH REPAIR OF DUODENAL ULCER;  Surgeon: Carolan Shiver, MD;  Location: [facility];  Service: General;  Laterality: N/A;  . LEFT HEART CATH AND CORONARY ANGIOGRAPHY Left 12/20/2018   Procedure: LEFT HEART CATH AND CORONARY ANGIOGRAPHY;  Surgeon: Marcina Millard, MD;  Location: [facility];  Service: Cardiovascular;  Laterality: Left;  . RADIAL ARTERY HARVEST Left 12/27/2018   Procedure: ENDOSCOPIC RADIAL ARTERY HARVEST;  Surgeon: Linden Dolin, MD;  Location: [facility];  Service: Open Heart Surgery;  Laterality: Left;  . REMOVAL OF A DIALYSIS CATHETER Left 03/20/2021   Procedure: REMOVAL OF A PD CATHETER;  Surgeon: Annice Needy, MD;  Location: [facility];  Service: Vascular;  Laterality: Left;  . REVISION OF ARTERIOVENOUS GORETEX GRAFT Left 09/11/2021   Procedure: Excisionof infected AV graft;  Surgeon: Renford Dills, MD;  Location: [facility];  Service: Vascular;  Laterality: Left;  . TEE WITHOUT CARDIOVERSION N/A 12/27/2018   Procedure: TRANSESOPHAGEAL ECHOCARDIOGRAM (TEE);  Surgeon: Linden Dolin, MD;  Location: [facility];  Service: Open Heart Surgery;  Laterality: N/A;  . TUMOR REMOVAL  2019   Bladder    Family History  Problem Relation Age of Onset  . Heart failure Mother     No Known Allergies     Latest Ref Rng & Units 11/16/2022    6:00 AM 11/16/2022    2:13 AM 10/21/2022    9:47 PM  CBC  WBC 4.0 - 10.5 K/uL 12.0  12.8  10.1   Hemoglobin 13.0 - 17.0 g/dL 52.7  78.2  42.3   Hematocrit 39.0 - 52.0 % 32.4  36.9  34.6   Platelets 150 - 400 K/uL 289  307  299        CMP     Component Value Date/Time  NA 138 11/17/2022 0914   NA 141 06/25/2022 1414   K 4.0 11/17/2022 0914   CL 99 11/17/2022 0914   CO2 22 11/17/2022 0914   GLUCOSE 177 (H) 11/17/2022 0914   BUN 61 (H) 11/17/2022 0914   BUN 39 (H) 06/25/2022 1414   CREATININE 6.65 (H) 11/17/2022 0914   CREATININE 4.62 (H) 01/10/2019 1351   CALCIUM 6.6 (L) 11/17/2022 0914   PROT 6.7 10/21/2022 2147   PROT 6.8 06/25/2022 1414   ALBUMIN 3.3 (L) 11/17/2022 0914   ALBUMIN 4.4 06/25/2022 1414   AST 18 10/21/2022 2147   ALT 25 10/21/2022 2147   ALKPHOS 54 10/21/2022 2147   BILITOT 0.4 10/21/2022 2147   BILITOT <0.2 06/25/2022 1414   EGFR 8 (L) 06/25/2022 1414   GFRNONAA 9 (L) 11/17/2022 0914     No results found.     Assessment & Plan:   1. ESRD (end stage renal disease) (HCC) I had a discussion with the patient regarding his dialysis access.  He previously had an infected graft removed from his left upper extremity and has been PermCath dependent since that time.  Currently the patient does not want to switch to a more permanent access and will like to remain PermCath dependent at this time.  2. Primary hypertension Continue antihypertensive medications as already ordered, these medications have been reviewed and there are no changes at this time.  3. Mixed hyperlipidemia Continue statin as ordered and reviewed, no changes at this time  4. Right leg pain I suspect that the patient is complaining of claudication in his right lower extremity.  It is also entirely possible that he has some radiculopathy.  Will have her return for ABIs in order to evaluate his perfusion in his right lower extremity.   Current Outpatient Medications on File Prior to Visit  Medication Sig Dispense Refill  . amLODipine (NORVASC) 2.5 MG tablet Take 1 tablet (2.5 mg total) by mouth daily. 30 tablet 0  . apixaban (ELIQUIS) 5 MG TABS tablet Take 1 tablet (5 mg total) by mouth 2 (two) times daily. 60 tablet  0  . aspirin EC 81 MG tablet Take 81 mg by mouth daily.    . calcium acetate (PHOSLO) 667 MG capsule Take 1 capsule (667 mg total) by mouth 3 (three) times daily with meals. 30 capsule 0  . iron sucrose in sodium chloride 0.9 % 100 mL Iron Sucrose (Venofer)    . isosorbide dinitrate (ISORDIL) 10 MG tablet Take 1 tablet (10 mg total) by mouth 3 (three) times daily. 90 tablet 0  . sevelamer carbonate (RENVELA) 800 MG tablet Take 1,600 mg by mouth 3 (three) times daily.    . pantoprazole (PROTONIX) 40 MG tablet Take 1 tablet (40 mg total) by mouth daily. 30 tablet 0   No current facility-administered medications on file prior to visit.    There are no Patient Instructions on file for this visit. No follow-ups on file.   Georgiana Spinner, NP

## 2022-11-23 NOTE — Progress Notes (Signed)
Subjective:    Patient ID: Anthony Hess, male    DOB: 1955/07/15, 67 y.o.   MRN: [id number] Chief Complaint  Patient presents with   Venous Insufficiency    Anthony Hess is a 67 year old male who presents today for follow-up evaluation after recent PermCath exchange.  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  He denies any signs symptoms of infection currently.  The patient notes that this time he is not interested in having an additional upper extremity access as he did before and would prefer to remain with his PermCath.  Additionally, he complains of pain with walking in his right lower extremity.  He notes that it has been ongoing for the last year or so.  He notes that he is able to walk for short distances and then he is to stop and rest before being able to walk a little bit further.  He denies any rest pain.  He denies any open wounds or ulcerations on the right lower extremity.    Review of Systems  Cardiovascular:        Claudication  Musculoskeletal:  Negative for back pain.  All other systems reviewed and are negative.      Objective:   Physical Exam Vitals reviewed.  HENT:     Head: Normocephalic.  Cardiovascular:     Rate and Rhythm: Normal rate.     Pulses:          Dorsalis pedis pulses are 0 on the right side.       Posterior tibial pulses are 0 on the right side.  Pulmonary:     Effort: Pulmonary effort is normal.  Skin:    General: Skin is warm and dry.  Neurological:     Mental Status: He is alert and oriented to person, place, and time.  Psychiatric:        Mood and Affect: Mood normal.        Behavior: Behavior normal.        Thought Content: Thought content normal.        Judgment: Judgment normal.     BP 114/82 (BP Location: Left Arm)   Pulse 85   Resp 18   Ht 5\' 2"  (1.575 m)   Wt 114 lb (51.7 kg)   BMI 20.85 kg/m   Past Medical History:  Diagnosis Date   Aortic  atherosclerosis (HCC)    Bilateral carotid artery disease (HCC)    Bladder cancer (HCC)    Coronary artery disease 12/20/2018   a.) LHC 12/20/2018: 50% OM1, 40% OM2, 95% o-pLAD, 75% o=pLCx, 40% mLM, 70% D1, 60% mRCA-1, 50% mRCA-2; refer to CVTS. b.) 4v CABG at [facility] on 12/27/2018: LIMA-LAD, RIMA-PDA, seg LRA-OM1-D1   DCM (dilated cardiomyopathy) (HCC) 12/05/2018   a.) TTE 12/05/2018: EF 40-45%. b.) TTE 12/28/2019: EF 20-25%.   Dyspnea 10/15/2022   ESRD (end stage renal disease) (HCC)    a.) T-Th-Sat   HFrEF (heart failure with reduced ejection fraction) (HCC) 12/05/2018   a.) TTE 12/05/2018: EF 40-45%; mild LVH; ant/apical/sep HK; mild TR . b.) TTE 12/28/2019: EF 20-25%; mod LVH; mod MR/AR; G1DD.   History of 2019 novel coronavirus disease (COVID-19) 04/08/2021   History of kidney stones    HLD (hyperlipidemia)    Hx of CABG 12/27/2018   Hypertension    Infrarenal abdominal aortic aneurysm (AAA) without rupture (HCC) 03/05/2021   a.) CT abd/pelvis; measured 3.2 cm.  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   a.) LIMA-LAD, RIMA-PDA, sequential LEFT radial artery to OM1 and D1   Sepsis (HCC) 03/14/2021   Wears glasses     Social History   Socioeconomic History   Marital status: Married    Spouse name: Not on file   Number of children: Not on file   Years of education: Not on file   Highest education level: Not on file  Occupational History   Not on file  Tobacco Use   Smoking status: Every Day    Current packs/day: 0.00    Types: Cigarettes    Last attempt to quit: 02/19/2019    Years since quitting: 3.7   Smokeless tobacco: Never  Vaping Use   Vaping status: Never Used  Substance and Sexual Activity   Alcohol use: Never   Drug use: Never   Sexual activity: Yes  Other Topics Concern   Not  on file  Social History Narrative   Not on file   Social Determinants of Health   Financial Resource Strain: Low Risk  (08/23/2022)   Overall Financial Resource Strain (CARDIA)    Difficulty of Paying Living Expenses: Not very hard  Food Insecurity: Food Insecurity Present (11/16/2022)   Hunger Vital Sign    Worried About Running Out of Food in the Last Year: Often true    Ran Out of Food in the Last Year: Often true  Transportation Needs: Unmet Transportation Needs (11/16/2022)   PRAPARE - Administrator, Civil Service (Medical): Yes    Lack of Transportation (Non-Medical): Yes  Physical Activity: Inactive (08/23/2022)   Exercise Vital Sign    Days of Exercise per Week: 0 days    Minutes of Exercise per Session: 0 min  Stress: No Stress Concern Present (08/23/2022)   [employer] of Occupational Health - Occupational Stress Questionnaire    Feeling of Stress : Not at all  Social Connections: Moderately Integrated (08/23/2022)   Social Connection and Isolation Panel [NHANES]    Frequency of Communication with Friends and Family: More than three times a week    Frequency of Social Gatherings with Friends and Family: Never    Attends Religious Services: More than 4 times per year    Active Member of [employer] or Organizations: No    Attends Banker Meetings: Never    Marital Status: Married  Catering manager Violence: Not At Risk (11/16/2022)   Humiliation, Afraid, Rape, and Kick questionnaire    Fear of Current or Ex-Partner: No    Emotionally Abused: No    Physically Abused: No    Sexually Abused: No    Past Surgical History:  Procedure Laterality Date   AV FISTULA PLACEMENT Left 07/30/2021   Procedure: INSERTION OF ARTERIOVENOUS (AV) GORE-TEX GRAFT ARM BRACHIAL ARTERY TO AXILLARY VEIN;  Surgeon: Annice Needy, MD;  Location: [facility] ORS;  Service: Vascular;  Laterality: Left;   CAPD INSERTION N/A 12/31/2019   Procedure: LAPAROSCOPIC INSERTION CONTINUOUS AMBULATORY  PERITONEAL DIALYSIS  (CAPD) CATHETER;  Surgeon: Leafy Ro, MD;  Location: [facility];  Service: General;  Laterality: N/A;   CAPD REMOVAL N/A 04/10/2020   Procedure: LAPAROSCOPIC REVISION OF CONTINUOUS AMBULATORY PERITONEAL DIALYSIS  (CAPD) CATHETER;  Surgeon: Leafy Ro, MD;  Location: [facility];  Service: General;  Laterality: N/A;   CORONARY ARTERY BYPASS GRAFT N/A 12/27/2018   Procedure: CORONARY ARTERY BYPASS GRAFTING (CABG) X 4 ON PUMP USING RIGHT & LEFT INTERNAL MAMMARY ARTERY LEFT RADIAL ARTERY ENDOSCOPICALLY HARVESTED;  Surgeon: Linden Dolin, MD;  [facility];  Service: Open Heart Surgery;  Laterality: N/A;   CYSTOSCOPY W/ RETROGRADES Bilateral 05/15/2019   Procedure: CYSTOSCOPY WITH RETROGRADE PYELOGRAM;  Surgeon: Riki Altes, MD;  Location: [facility];  Service: Urology;  Laterality: Bilateral;   CYSTOSCOPY WITH BIOPSY N/A 05/15/2019   Procedure: CYSTOSCOPY WITH bladder BIOPSY;  Surgeon: Riki Altes, MD;  Location: [facility];  Service: Urology;  Laterality: N/A;   DIALYSIS/PERMA CATHETER INSERTION N/A 12/28/2019   Procedure: DIALYSIS/PERMA CATHETER INSERTION;  Surgeon: Annice Needy, MD;  Location: [facility];  Service: Cardiovascular;  Laterality: N/A;   DIALYSIS/PERMA CATHETER INSERTION N/A 03/18/2021   Procedure: DIALYSIS/PERMA CATHETER INSERTION;  Surgeon: Annice Needy, MD;  Location: [facility];  Service: Cardiovascular;  Laterality: N/A;   DIALYSIS/PERMA CATHETER REMOVAL N/A 06/02/2020   Procedure: DIALYSIS/PERMA CATHETER REMOVAL;  Surgeon: Annice Needy, MD;  Location: [facility];  Service: Cardiovascular;  Laterality: N/A;   DIALYSIS/PERMA CATHETER REPAIR N/A 10/22/2022   Procedure: DIALYSIS/PERMA CATHETER REPAIR;  Surgeon: Renford Dills, MD;  Location: [facility];  Service: Cardiovascular;  Laterality: N/A;   EXCHANGE OF A DIALYSIS CATHETER Right 04/10/2020   Procedure: EXCHANGE OF A DIALYSIS CATHETER;  Surgeon: Leafy Ro, MD;  Location: [facility];  Service: General;  Laterality: Right;   INCISIONAL HERNIA REPAIR  01/20/2021   Procedure: HERNIA REPAIR INCISIONAL;  Surgeon: Henrene Dodge, MD;  Location: [facility];  Service: General;;   IR IMAGE GUIDED DRAINAGE PERCUT CATH  PERITONEAL RETROPERIT  04/07/2020   LAPAROSCOPY N/A 04/16/2021   Procedure: LAPAROSCOPY DIAGNOSTIC;  Surgeon: Sung Amabile, DO;  Location: [facility];  Service: General;  Laterality: N/A;   LAPAROTOMY N/A 04/26/2022   Procedure: EXPLORATORY LAPAROTOMY WITH REPAIR OF DUODENAL ULCER;  Surgeon: Carolan Shiver, MD;  Location: [facility];  Service: General;  Laterality: N/A;   LEFT HEART CATH AND CORONARY ANGIOGRAPHY Left 12/20/2018   Procedure: LEFT HEART CATH AND CORONARY ANGIOGRAPHY;  Surgeon: Marcina Millard, MD;  Location: [facility];  Service: Cardiovascular;  Laterality: Left;   RADIAL ARTERY HARVEST Left 12/27/2018   Procedure: ENDOSCOPIC RADIAL ARTERY HARVEST;  Surgeon: Linden Dolin, MD;  [facility];  Service: Open Heart Surgery;  Laterality: Left;   REMOVAL OF A DIALYSIS CATHETER Left 03/20/2021   Procedure: REMOVAL OF A PD CATHETER;  Surgeon: Annice Needy, MD;  Location: [facility];  Service: Vascular;  Laterality: Left;   REVISION OF ARTERIOVENOUS GORETEX GRAFT Left 09/11/2021   Procedure: Excisionof infected AV graft;  Surgeon: Renford Dills, MD;  Location: [facility];  Service: Vascular;  Laterality: Left;   TEE WITHOUT CARDIOVERSION N/A 12/27/2018   Procedure: TRANSESOPHAGEAL ECHOCARDIOGRAM (TEE);  Surgeon: Linden Dolin, MD;  Location: [facility] OR;  Service: Open Heart Surgery;  Laterality: N/A;   TUMOR REMOVAL  2019   Bladder    Family History  Problem Relation Age of Onset   Heart failure Mother     No Known Allergies     Latest Ref Rng & Units 11/16/2022    6:00 AM 11/16/2022    2:13 AM 10/21/2022    9:47 PM  CBC  WBC 4.0 - 10.5 K/uL 12.0  12.8  10.1  Hemoglobin 13.0 - 17.0 g/dL 78.2  95.6   21.3   Hematocrit 39.0 - 52.0 % 32.4  36.9  34.6   Platelets 150 - 400 K/uL 289  307  299       CMP     Component Value Date/Time   NA 138 11/17/2022 0914   NA 141 06/25/2022 1414   K 4.0 11/17/2022 0914   CL 99 11/17/2022 0914   CO2 22 11/17/2022 0914   GLUCOSE 177 (H) 11/17/2022 0914   BUN 61 (H) 11/17/2022 0914   BUN 39 (H) 06/25/2022 1414   CREATININE 6.65 (H) 11/17/2022 0914   CREATININE 4.62 (H) 01/10/2019 1351   CALCIUM 6.6 (L) 11/17/2022 0914   PROT 6.7 10/21/2022 2147   PROT 6.8 06/25/2022 1414   ALBUMIN 3.3 (L) 11/17/2022 0914   ALBUMIN 4.4 06/25/2022 1414   AST 18 10/21/2022 2147   ALT 25 10/21/2022 2147   ALKPHOS 54 10/21/2022 2147   BILITOT 0.4 10/21/2022 2147   BILITOT <0.2 06/25/2022 1414   EGFR 8 (L) 06/25/2022 1414   GFRNONAA 9 (L) 11/17/2022 0914     No results found.     Assessment & Plan:   1. ESRD (end stage renal disease) (HCC) I had a discussion with the patient regarding his dialysis access.  He previously had an infected graft removed from his left upper extremity and has been PermCath dependent since that time.  Currently the patient does not want to switch to a more permanent access and will like to remain PermCath dependent at this time.  2. Primary hypertension Continue antihypertensive medications as already ordered, these medications have been reviewed and there are no changes at this time.  3. Mixed hyperlipidemia Continue statin as ordered and reviewed, no changes at this time  4. Right leg pain I suspect that the patient is complaining of claudication in his right lower extremity.  It is also entirely possible that he has some radiculopathy.  Will have her return for ABIs in order to evaluate his perfusion in his right lower extremity.   Current Outpatient Medications on File Prior to Visit  Medication Sig Dispense Refill   amLODipine (NORVASC) 2.5 MG tablet Take 1 tablet (2.5 mg total) by mouth daily. 30 tablet 0   apixaban  (ELIQUIS) 5 MG TABS tablet Take 1 tablet (5 mg total) by mouth 2 (two) times daily. 60 tablet 0   aspirin EC 81 MG tablet Take 81 mg by mouth daily.     calcium acetate (PHOSLO) 667 MG capsule Take 1 capsule (667 mg total) by mouth 3 (three) times daily with meals. 30 capsule 0   iron sucrose in sodium chloride 0.9 % 100 mL Iron Sucrose (Venofer)     isosorbide dinitrate (ISORDIL) 10 MG tablet Take 1 tablet (10 mg total) by mouth 3 (three) times daily. 90 tablet 0   sevelamer carbonate (RENVELA) 800 MG tablet Take 1,600 mg by mouth 3 (three) times daily.     pantoprazole (PROTONIX) 40 MG tablet Take 1 tablet (40 mg total) by mouth daily. 30 tablet 0   No current facility-administered medications on file prior to visit.    There are no Patient Instructions on file for this visit. No follow-ups on file.   Georgiana Spinner, NP

## 2022-11-24 ENCOUNTER — Telehealth: Payer: Self-pay

## 2022-11-29 NOTE — Progress Notes (Deleted)
Established patient visit   Patient: Anthony Hess   DOB: 09/20/1955   67 y.o. Male  MRN: [id number] Visit Date: 11/30/2022  Today's healthcare provider: Ronnald Ramp, MD   No chief complaint on file.  Subjective       Discussed the use of AI scribe software for clinical note transcription with the patient, who gave verbal consent to proceed.  History of Present Illness          Patient presents for hospital follow up after admission for respiratory distress He was admitted on 9/24 and discharged on 9/25 His hx is significant for HFrEF 20-25%,ESRD on HD,AAA, bladder CA, CAD, dilated cardiomyopathy, NSTEMI  TOC completed on 11/18/22  Med Changes     Past Medical History:  Diagnosis Date   Aortic atherosclerosis (HCC)    Bilateral carotid artery disease (HCC)    Bladder cancer (HCC)    Coronary artery disease 12/20/2018   a.) LHC 12/20/2018: 50% OM1, 40% OM2, 95% o-pLAD, 75% o=pLCx, 40% mLM, 70% D1, 60% mRCA-1, 50% mRCA-2; refer to CVTS. b.) 4v CABG at [facility] on 12/27/2018: LIMA-LAD, RIMA-PDA, seg LRA-OM1-D1   DCM (dilated cardiomyopathy) (HCC) 12/05/2018   a.) TTE 12/05/2018: EF 40-45%. b.) TTE 12/28/2019: EF 20-25%.   Dyspnea 10/15/2022   ESRD (end stage renal disease) (HCC)    a.) T-Th-Sat   HFrEF (heart failure with reduced ejection fraction) (HCC) 12/05/2018   a.) TTE 12/05/2018: EF 40-45%; mild LVH; ant/apical/sep HK; mild TR . b.) TTE 12/28/2019: EF 20-25%; mod LVH; mod MR/AR; G1DD.   History of 2019 novel coronavirus disease (COVID-19) 04/08/2021   History of kidney stones    HLD (hyperlipidemia)    Hx of CABG 12/27/2018   Hypertension    Infrarenal abdominal aortic aneurysm (AAA) without rupture (HCC) 03/05/2021   a.) CT abd/pelvis; measured 3.2 cm.   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   apixaban (ELIQUIS) 5 MG TABS tablet Take 1 tablet (5 mg total) by mouth 2 (two) times daily.   aspirin EC 81 MG tablet Take 81 mg by mouth daily.   calcium acetate (PHOSLO) 667 MG capsule Take 1 capsule (667 mg total) by mouth 3 (three) times daily with meals.   iron sucrose in sodium chloride 0.9 % 100 mL Iron Sucrose (Venofer)   isosorbide dinitrate (ISORDIL) 10 MG tablet Take 1 tablet (10 mg total) by mouth 3 (three) times daily.   pantoprazole (PROTONIX) 40 MG tablet Take 1 tablet (40 mg total) by mouth daily.   sevelamer carbonate (RENVELA) 800 MG tablet Take 1,600 mg by mouth 3 (three) times daily.   No facility-administered medications prior to visit.    Review of Systems  {Insert previous labs (optional):23779} {See past labs  Heme  Chem  Endocrine  Serology  Results Review (optional):1}   Objective    There were no vitals taken for this visit. {Insert last BP/Wt (optional):23777}{See vitals history (optional):1}   Physical Exam  ***  No results found for any  visits on 11/30/22.  Assessment & Plan     Problem List Items Addressed This Visit   None   Assessment and Plan              No follow-ups on file.         Ronnald Ramp, MD  [facility] [phone] (phone) [phone] (fax)  [facility]

## 2022-11-30 ENCOUNTER — Inpatient Hospital Stay: Payer: Medicare Other | Admitting: Family Medicine

## 2022-11-30 DIAGNOSIS — Z09 Encounter for follow-up examination after completed treatment for conditions other than malignant neoplasm: Secondary | ICD-10-CM

## 2022-12-04 IMAGING — XA IR IMAGE GUIDED DRAINAGE PERCUT CATH  PERITONEAL RETROPERIT
3 series · 9 of 9 positions shown · non-contrast
Comparison: none

INDICATION: 62-year-old gentleman with poorly functioning peritoneal dialysis
catheter presents to interventional radiology for manipulation.

[Series 1: subclavian 3 fps · 1 of 1 slices shown]
[im 1/1]
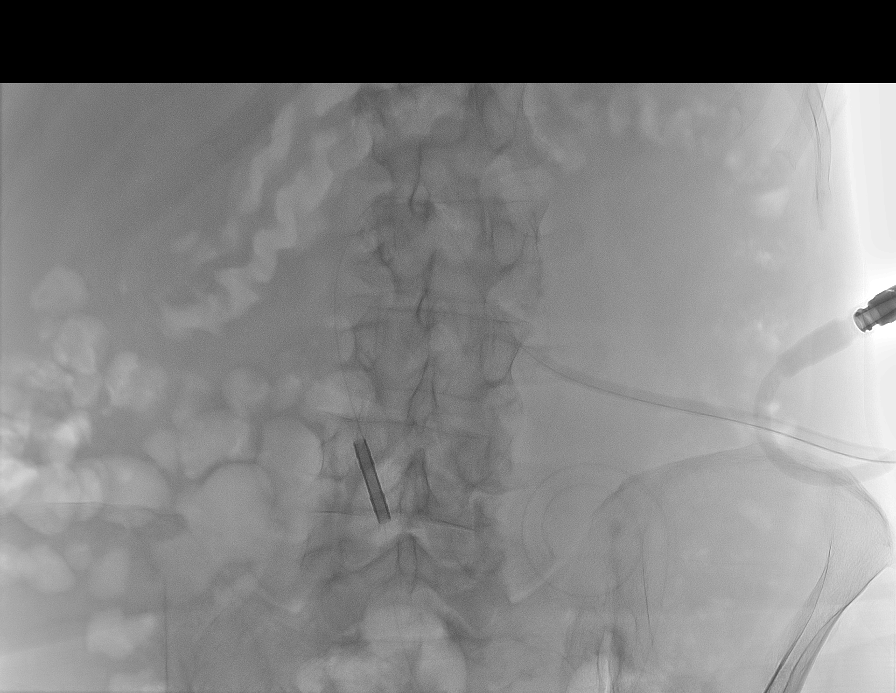

[Series 2: fluoroscopy - stored · 4 of 88 frames shown (1 of 2)]
[frame 1/88]
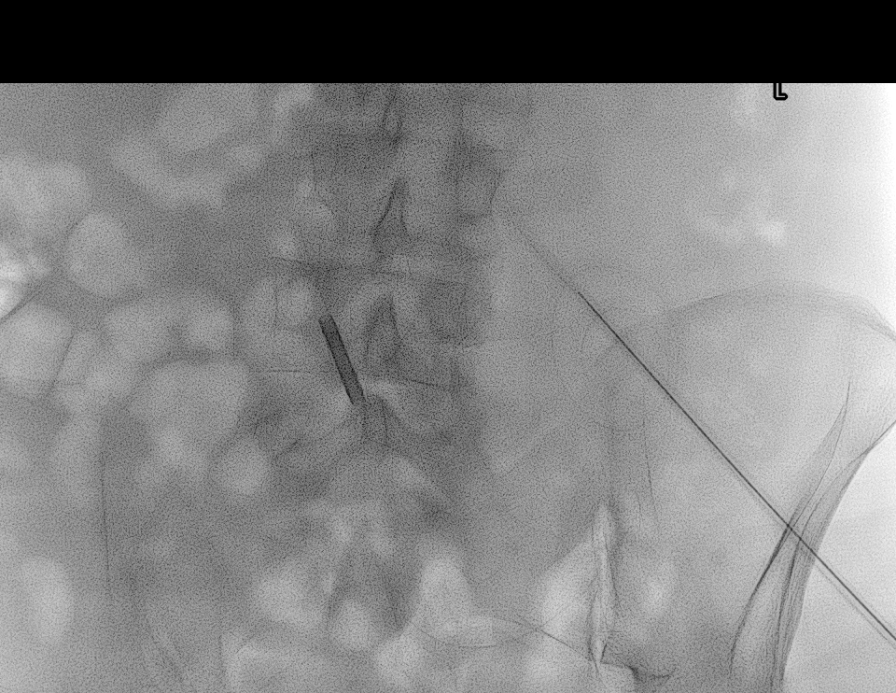
[frame 14/88]
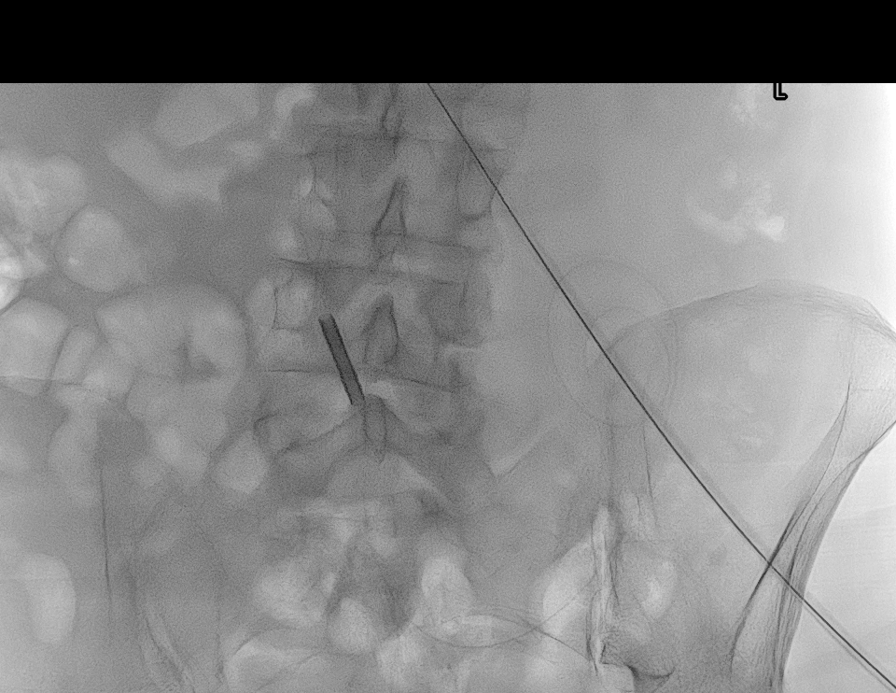
[frame 45/88]
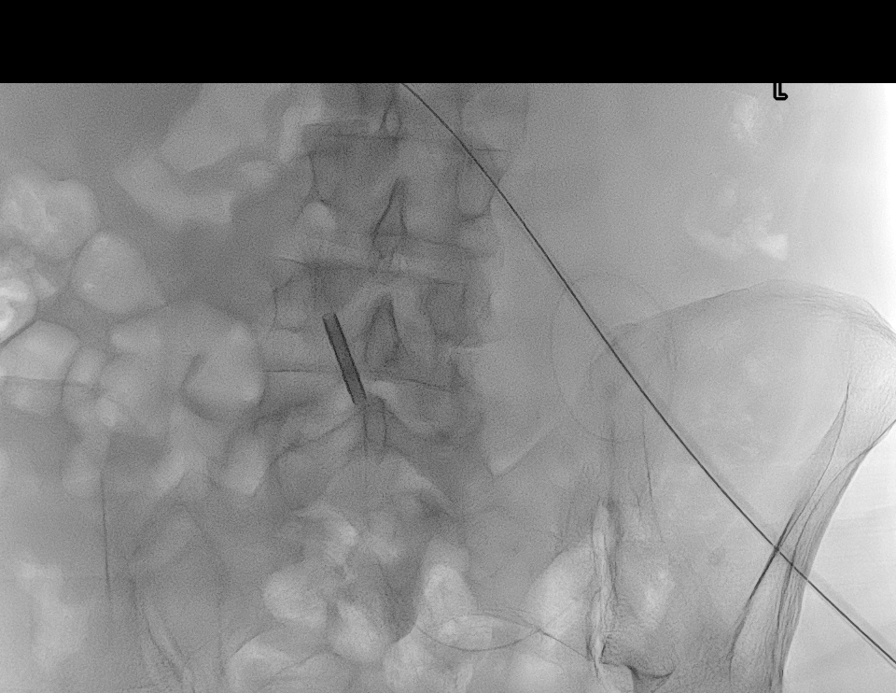
[frame 75/88]
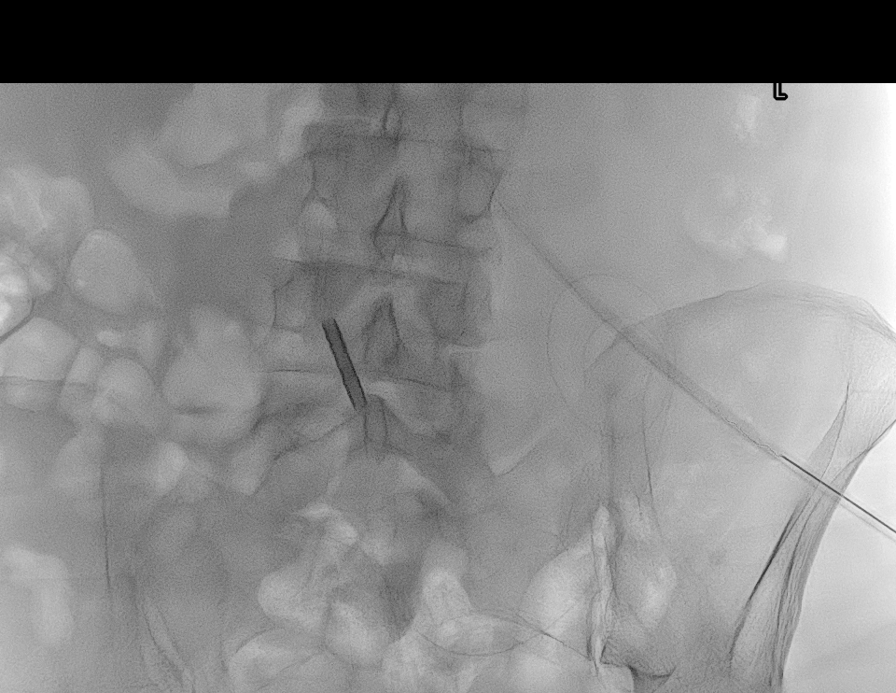

[Series 3: fluoroscopy - stored · 4 of 78 frames shown (2 of 2)]
[frame 1/78]
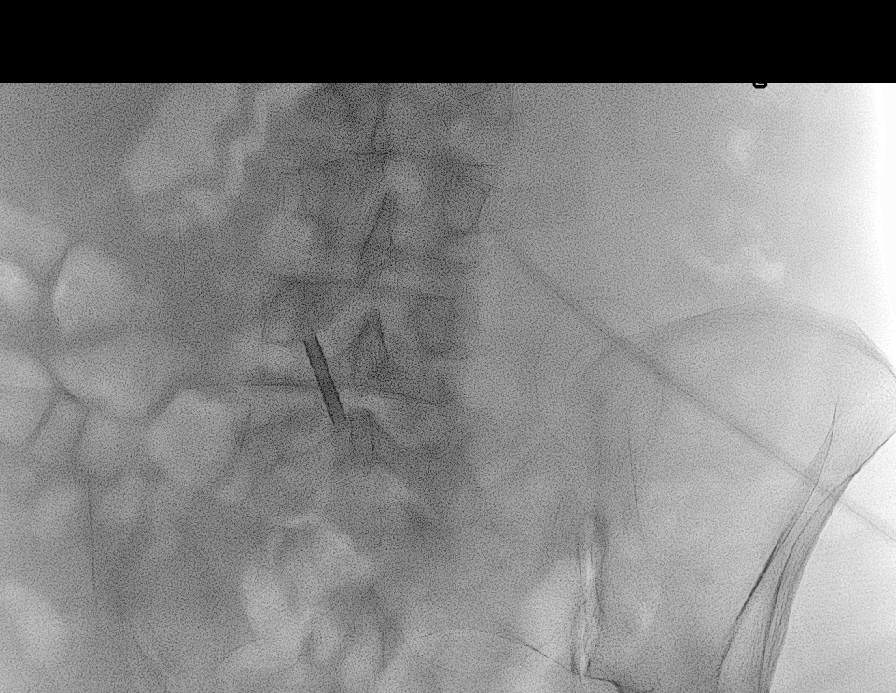
[frame 12/78]
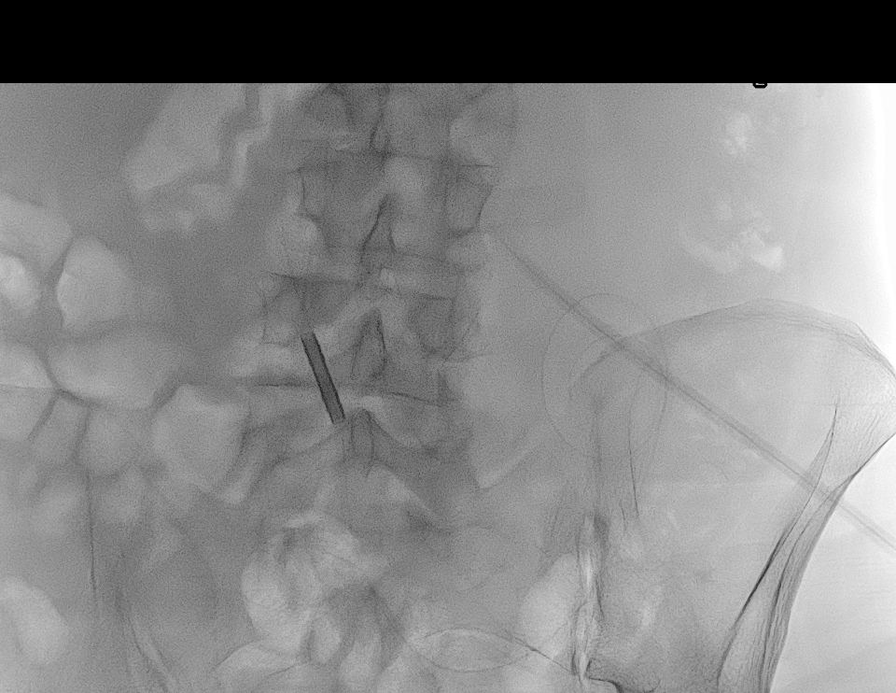
[frame 40/78]
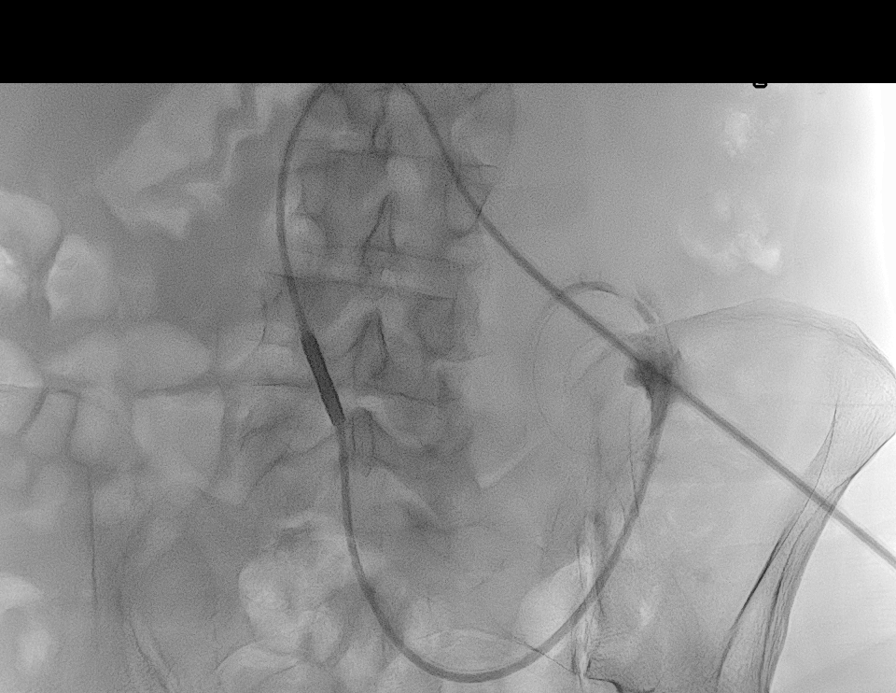
[frame 67/78]
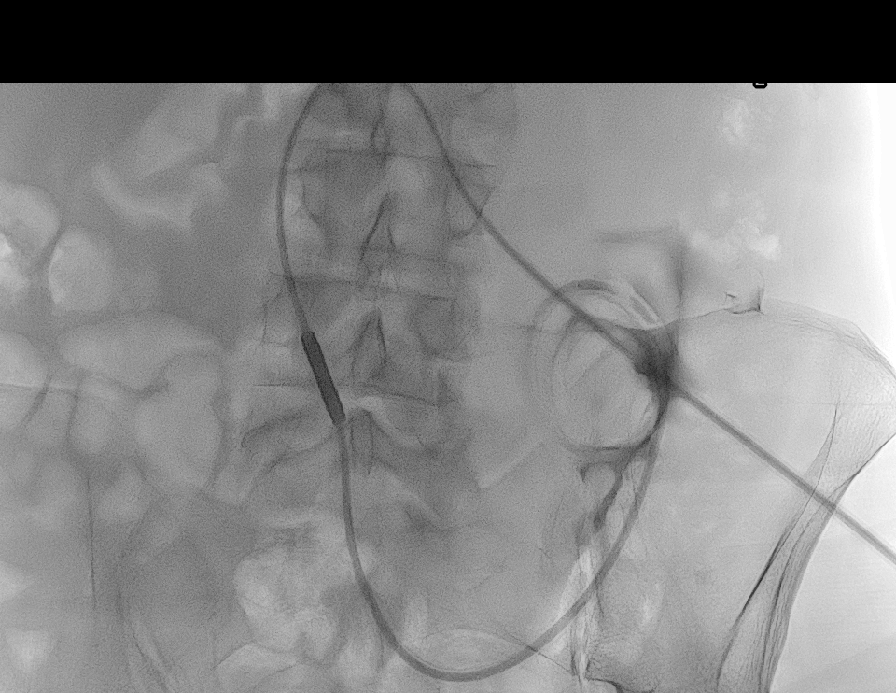

[9 of 9 positions shown; findings below may reference images not displayed]

EXAM:
Peritoneal dialysis catheter manipulation

MEDICATIONS:
Ancef 1 g IV

ANESTHESIA/SEDATION:
None

COMPLICATIONS:
None immediate.

PROCEDURE:
Informed written consent was obtained from the patient after a
thorough discussion of the procedural risks, benefits and
alternatives. All questions were addressed. Maximal Sterile Barrier
Technique was utilized including caps, mask, sterile gowns, sterile
gloves, sterile drape, hand hygiene and skin antiseptic. A timeout
was performed prior to the initiation of the procedure.

External segment of the peritoneal dialysis catheter and surrounding
skin prepped and draped in usual fashion. The catheter was
manipulated under fluoroscopy with stiff glide and superstiff
Amplatz guidewires. Although the overall position of the pigtail did
not change, there was improved drainage at the end the procedure.

Drain flushed and capped.
IMPRESSION: Fluoroscopic manipulation of peritoneal dialysis catheter as above.

## 2022-12-06 ENCOUNTER — Telehealth (INDEPENDENT_AMBULATORY_CARE_PROVIDER_SITE_OTHER): Payer: Self-pay

## 2022-12-06 NOTE — Telephone Encounter (Signed)
Pt states he has an appt on 11/11 but can not wait that long.  His right leg is really hurting, aching and throbbing pt states he can not walk 20 feet without pain.  Pt states that this has been going on for a couple months.  Pt says the leg is not cold or hot to the touch and has a cut on his ankle.  Please advise as pt states he needs to be seen ASAP.

## 2022-12-07 NOTE — Telephone Encounter (Signed)
ATC pt on phone # listed in chart. States my call could not be completed as dialed. I did LVM with spouse to have pt call back to schedule an appt per Dr. Gilda Crease.

## 2022-12-13 NOTE — Progress Notes (Unsigned)
Established patient visit  Patient: Anthony Hess   DOB: 09/26/1955   67 y.o. Male  MRN: [id number] Visit Date: 12/14/2022  Today's healthcare provider: Debera Lat, PA-C   No chief complaint on file.  Subjective    HPI  *** Discussed the use of AI scribe software for clinical note transcription with the patient, who gave verbal consent to proceed.  History of Present Illness               08/23/2022    9:29 AM 06/25/2022    1:10 PM 01/11/2020    1:14 PM  Depression screen PHQ 2/9  Decreased Interest 0 0 0  Down, Depressed, Hopeless 0 0 0  PHQ - 2 Score 0 0 0  Altered sleeping  0   Tired, decreased energy  0   Change in appetite  0   Feeling bad or failure about yourself   0   Trouble concentrating  0   Moving slowly or fidgety/restless  0   Suicidal thoughts  0   PHQ-9 Score  0   Difficult doing work/chores Not difficult at all Not difficult at all       06/25/2022    1:10 PM  GAD 7 : Generalized Anxiety Score  Nervous, Anxious, on Edge 0  Control/stop worrying 0  Worry too much - different things 0  Trouble relaxing 0  Restless 0  Easily annoyed or irritable 0  Afraid - awful might happen 0  Total GAD 7 Score 0  Anxiety Difficulty Not difficult at all    Medications: Outpatient Medications Prior to Visit  Medication Sig   amLODipine (NORVASC) 2.5 MG tablet Take 1 tablet (2.5 mg total) by mouth daily.   apixaban (ELIQUIS) 5 MG TABS tablet Take 1 tablet (5 mg total) by mouth 2 (two) times daily.   aspirin EC 81 MG tablet Take 81 mg by mouth daily.   calcium acetate (PHOSLO) 667 MG capsule Take 1 capsule (667 mg total) by mouth 3 (three) times daily with meals.   isosorbide dinitrate (ISORDIL) 10 MG tablet Take 1 tablet (10 mg total) by mouth 3 (three) times daily.   pantoprazole (PROTONIX) 40 MG tablet Take 1 tablet (40 mg total) by mouth daily.   sevelamer carbonate (RENVELA) 800 MG tablet Take 1,600 mg by mouth 3 (three) times daily.   No  facility-administered medications prior to visit.    Review of Systems  All other systems reviewed and are negative.  Except see HPI   {Insert previous labs (optional):23779} {See past labs  Heme  Chem  Endocrine  Serology  Results Review (optional):1}   Objective    There were no vitals taken for this visit. {Insert last BP/Wt (optional):23777}{See vitals history (optional):1}   Physical Exam Vitals reviewed.  Constitutional:      General: He is not in acute distress.    Appearance: Normal appearance. He is not diaphoretic.  HENT:     Head: Normocephalic and atraumatic.  Eyes:     General: No scleral icterus.    Conjunctiva/sclera: Conjunctivae normal.  Cardiovascular:     Rate and Rhythm: Normal rate and regular rhythm.     Pulses: Normal pulses.     Heart sounds: Normal heart sounds. No murmur heard. Pulmonary:     Effort: Pulmonary effort is normal. No respiratory distress.     Breath sounds: Normal breath sounds. No wheezing or rhonchi.  Musculoskeletal:     Cervical back: Neck supple.  Right lower leg: No edema.     Left lower leg: No edema.  Lymphadenopathy:     Cervical: No cervical adenopathy.  Skin:    General: Skin is warm and dry.     Findings: No rash.  Neurological:     Mental Status: He is alert and oriented to person, place, and time. Mental status is at baseline.  Psychiatric:        Mood and Affect: Mood normal.        Behavior: Behavior normal.      No results found for any visits on 12/14/22.  Assessment & Plan    *** Assessment and Plan              No follow-ups on file.     The patient was advised to call back or seek an in-person evaluation if the symptoms worsen or if the condition fails to improve as anticipated.  I discussed the assessment and treatment plan with the patient. The patient was provided an opportunity to ask questions and all were answered. The patient agreed with the plan and demonstrated an  understanding of the instructions.  I, Debera Lat, PA-C have reviewed all documentation for this visit. The documentation on  12/13/22  for the exam, diagnosis, procedures, and orders are all accurate and complete.  Debera Lat, [facility], MMS [facility] [phone] (phone) [phone] (fax)  [facility]

## 2022-12-14 ENCOUNTER — Ambulatory Visit: Payer: Medicare Other | Admitting: Physician Assistant

## 2022-12-14 ENCOUNTER — Other Ambulatory Visit (INDEPENDENT_AMBULATORY_CARE_PROVIDER_SITE_OTHER): Payer: Self-pay | Admitting: Vascular Surgery

## 2022-12-14 VITALS — BP 81/69 | HR 87 | Temp 99.1°F | Ht 63.0 in | Wt 114.4 lb

## 2022-12-14 DIAGNOSIS — S91001D Unspecified open wound, right ankle, subsequent encounter: Secondary | ICD-10-CM

## 2022-12-14 DIAGNOSIS — L84 Corns and callosities: Secondary | ICD-10-CM

## 2022-12-14 DIAGNOSIS — M79604 Pain in right leg: Secondary | ICD-10-CM

## 2022-12-14 DIAGNOSIS — I9589 Other hypotension: Secondary | ICD-10-CM

## 2022-12-14 DIAGNOSIS — R9431 Abnormal electrocardiogram [ECG] [EKG]: Secondary | ICD-10-CM

## 2022-12-14 DIAGNOSIS — M79606 Pain in leg, unspecified: Secondary | ICD-10-CM | POA: Diagnosis not present

## 2022-12-15 ENCOUNTER — Ambulatory Visit (INDEPENDENT_AMBULATORY_CARE_PROVIDER_SITE_OTHER): Payer: Medicare Other | Admitting: Nurse Practitioner

## 2022-12-15 ENCOUNTER — Encounter: Payer: Self-pay | Admitting: Physician Assistant

## 2022-12-15 ENCOUNTER — Encounter (INDEPENDENT_AMBULATORY_CARE_PROVIDER_SITE_OTHER): Payer: Self-pay | Admitting: Nurse Practitioner

## 2022-12-15 ENCOUNTER — Other Ambulatory Visit (INDEPENDENT_AMBULATORY_CARE_PROVIDER_SITE_OTHER): Payer: Self-pay | Admitting: Vascular Surgery

## 2022-12-15 ENCOUNTER — Ambulatory Visit (INDEPENDENT_AMBULATORY_CARE_PROVIDER_SITE_OTHER): Payer: Medicare Other

## 2022-12-15 ENCOUNTER — Telehealth (INDEPENDENT_AMBULATORY_CARE_PROVIDER_SITE_OTHER): Payer: Self-pay

## 2022-12-15 VITALS — BP 92/60 | HR 79 | Resp 15 | Wt 116.0 lb

## 2022-12-15 DIAGNOSIS — M79604 Pain in right leg: Secondary | ICD-10-CM

## 2022-12-15 DIAGNOSIS — I10 Essential (primary) hypertension: Secondary | ICD-10-CM

## 2022-12-15 DIAGNOSIS — I70221 Atherosclerosis of native arteries of extremities with rest pain, right leg: Secondary | ICD-10-CM

## 2022-12-15 DIAGNOSIS — R6889 Other general symptoms and signs: Secondary | ICD-10-CM | POA: Diagnosis not present

## 2022-12-15 DIAGNOSIS — E782 Mixed hyperlipidemia: Secondary | ICD-10-CM

## 2022-12-15 MED ORDER — OXYCODONE-ACETAMINOPHEN 5-325 MG PO TABS: 1.0000 | ORAL_TABLET | ORAL | 0 refills | Status: DC | PRN

## 2022-12-15 NOTE — Telephone Encounter (Signed)
Patient was seen in office today and per Sheppard Plumber NP, he is to be scheduled for a RLE angio with Dr. Gilda Crease on 12/16/22 with a 6:45 am arrival time to the [facility]. Pre-procedure instructions were discussed and handed to the patient. Patient and his spouse that was on the phone preceded to state that he could not do this procedure tomorrow and handed the instructions back to me. Patient's spouse (on the phone) want to speak with the NP. Vivia Birmingham was given the information and went in to speak with the patient's spouse. Patient has been moved to 12/21/22 with a 10:00 am arrival time to the [facility]. Pre-procedure instructions will be handed to the patient.

## 2022-12-15 NOTE — Progress Notes (Signed)
 Subjective:    Patient ID: Anthony Hess, male    DOB: [birth year], 67 y.o.   MRN: [id number] Chief Complaint  Patient presents with   Follow-up    Ischemic right leg    The patient is a 67 year old male who returns today for evaluation of possible peripheral arterial disease in the setting of ongoing leg pain.  He notes that he has had this pain in his leg ongoing for the last several months.  He notes that he has had multiple DVT studies and other evaluations but ultimately he has not been able to detect a cause for his lower extremity pain.  Finally he was seen by us  recently and scheduled to return for noninvasive studies.  He has symptoms that are also consistent with significant claudication and rest pain.  Today it is noted that he has a right ABI of 0 and a TBI of 0.  The left lower extremity has ABI of 1.01 with a TBI of 0 as well.  Additional arterial duplex shows occlusion of his SFA as well as what appears to be likely occlusion of all tibial vessels.  His left lower extremity is primarily patent until we reached the tibial vessels were transitioned to monophasic waveforms.    Review of Systems  Cardiovascular:        Claudication and rest pain  All other systems reviewed and are negative.      Objective:   Physical Exam Vitals reviewed.  HENT:     Head: Normocephalic.  Cardiovascular:     Rate and Rhythm: Normal rate.  Pulmonary:     Effort: Pulmonary effort is normal.  Skin:    General: Skin is warm and dry.  Neurological:     Mental Status: He is alert and oriented to person, place, and time.  Psychiatric:        Mood and Affect: Mood normal.        Behavior: Behavior normal.        Thought Content: Thought content normal.        Judgment: Judgment normal.     BP 92/60 (BP Location: Left Arm)   Pulse 79   Resp 15   Wt 116 lb (52.6 kg)   BMI 20.55 kg/m   Past Medical History:  Diagnosis Date   Aortic atherosclerosis (HCC)    Bilateral carotid artery  disease (HCC)    Bladder cancer (HCC)    Coronary artery disease 12/20/2018   a.) LHC 12/20/2018: 50% OM1, 40% OM2, 95% o-pLAD, 75% o=pLCx, 40% mLM, 70% D1, 60% mRCA-1, 50% mRCA-2; refer to CVTS. b.) 4v CABG at [facility] on 12/27/2018: LIMA-LAD, RIMA-PDA, seg LRA-OM1-D1   DCM (dilated cardiomyopathy) (HCC) 12/05/2018   a.) TTE 12/05/2018: EF 40-45%. b.) TTE 12/28/2019: EF 20-25%.   Dyspnea 10/15/2022   ESRD (end stage renal disease) (HCC)    a.) T-Th-Sat   HFrEF (heart failure with reduced ejection fraction) (HCC) 12/05/2018   a.) TTE 12/05/2018: EF 40-45%; mild LVH; ant/apical/sep HK; mild TR . b.) TTE 12/28/2019: EF 20-25%; mod LVH; mod MR/AR; G1DD.   History of 2019 novel coronavirus disease (COVID-19) 04/08/2021   History of kidney stones    HLD (hyperlipidemia)    Hx of CABG 12/27/2018   Hypertension    Infrarenal abdominal aortic aneurysm (AAA) without rupture (HCC) 03/05/2021   a.) CT abd/pelvis; measured 3.2 cm.   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   a.) LIMA-LAD, RIMA-PDA, sequential LEFT radial artery to OM1 and D1   Sepsis (HCC) 03/14/2021   Wears glasses     Social History   Socioeconomic History   Marital status: Married    Spouse name: Not on file   Number of children: Not on file   Years of education: Not on file   Highest education level: Not on file  Occupational History   Not on file  Tobacco Use   Smoking status: Every Day    Current packs/day: 0.00    Types: Cigarettes    Last attempt to quit: 02/19/2019    Years since quitting: 3.8   Smokeless tobacco: Never  Vaping Use   Vaping status: Never Used  Substance and Sexual Activity   Alcohol use: Never   Drug use: Never   Sexual activity: Yes  Other Topics Concern   Not on file  Social History Narrative   Not on file    Social Determinants of Health   Financial Resource Strain: Low Risk  (08/23/2022)   Overall Financial Resource Strain (CARDIA)    Difficulty of Paying Living Expenses: Not very hard  Food Insecurity: Food Insecurity Present (11/16/2022)   Hunger Vital Sign    Worried About Running Out of Food in the Last Year: Often true    Ran Out of Food in the Last Year: Often true  Transportation Needs: Unmet Transportation Needs (11/16/2022)   PRAPARE - Administrator, Civil Service (Medical): Yes    Lack of Transportation (Non-Medical): Yes  Physical Activity: Inactive (08/23/2022)   Exercise Vital Sign    Days of Exercise per Week: 0 days    Minutes of Exercise per Session: 0 min  Stress: No Stress Concern Present (08/23/2022)   [employer] of Occupational Health - Occupational Stress Questionnaire    Feeling of Stress : Not at all  Social Connections: Moderately Integrated (08/23/2022)   Social Connection and Isolation Panel [NHANES]    Frequency of Communication with Friends and Family: More than three times a week    Frequency of Social Gatherings with Friends and Family: Never    Attends Religious Services: More than 4 times per year    Active Member of Clubs or Organizations: No    Attends Banker Meetings: Never    Marital Status: Married  Catering manager Violence: Not At Risk (11/16/2022)   Humiliation, Afraid, Rape, and Kick questionnaire    Fear of Current or Ex-Partner: No    Emotionally Abused: No    Physically Abused: No    Sexually Abused: No    Past Surgical History:  Procedure Laterality Date   AV FISTULA PLACEMENT Left 07/30/2021   Procedure: INSERTION OF ARTERIOVENOUS (AV) GORE-TEX GRAFT ARM BRACHIAL ARTERY TO AXILLARY VEIN;  Surgeon: Marea Selinda RAMAN, MD;  Location: [facility];  Service: Vascular;  Laterality: Left;   CAPD INSERTION N/A 12/31/2019   Procedure: LAPAROSCOPIC INSERTION CONTINUOUS AMBULATORY PERITONEAL DIALYSIS  (CAPD) CATHETER;  Surgeon:  Jordis Laneta FALCON, MD;  Location: [facility];  Service: General;  Laterality: N/A;   CAPD REMOVAL N/A 04/10/2020   Procedure: LAPAROSCOPIC REVISION OF CONTINUOUS AMBULATORY PERITONEAL DIALYSIS  (CAPD) CATHETER;  Surgeon: Jordis Laneta FALCON, MD;  Location: [facility];  Service: General;  Laterality: N/A;   CORONARY ARTERY BYPASS GRAFT N/A 12/27/2018   Procedure: CORONARY ARTERY BYPASS GRAFTING (CABG) X  4 ON PUMP USING RIGHT & LEFT INTERNAL MAMMARY ARTERY LEFT RADIAL ARTERY ENDOSCOPICALLY HARVESTED;  Surgeon: German Bartlett PEDLAR, MD;  Location: [facility];  Service: Open Heart Surgery;  Laterality: N/A;   CYSTOSCOPY W/ RETROGRADES Bilateral 05/15/2019   Procedure: CYSTOSCOPY WITH RETROGRADE PYELOGRAM;  Surgeon: Twylla Glendia BROCKS, MD;  Location: [facility];  Service: Urology;  Laterality: Bilateral;   CYSTOSCOPY WITH BIOPSY N/A 05/15/2019   Procedure: CYSTOSCOPY WITH bladder BIOPSY;  Surgeon: Twylla Glendia BROCKS, MD;  Location: [facility];  Service: Urology;  Laterality: N/A;   DIALYSIS/PERMA CATHETER INSERTION N/A 12/28/2019   Procedure: DIALYSIS/PERMA CATHETER INSERTION;  Surgeon: Marea Selinda RAMAN, MD;  Location: [facility];  Service: Cardiovascular;  Laterality: N/A;   DIALYSIS/PERMA CATHETER INSERTION N/A 03/18/2021   Procedure: DIALYSIS/PERMA CATHETER INSERTION;  Surgeon: Marea Selinda RAMAN, MD;  Location: [facility];  Service: Cardiovascular;  Laterality: N/A;   DIALYSIS/PERMA CATHETER REMOVAL N/A 06/02/2020   Procedure: DIALYSIS/PERMA CATHETER REMOVAL;  Surgeon: Marea Selinda RAMAN, MD;  Location: [facility];  Service: Cardiovascular;  Laterality: N/A;   DIALYSIS/PERMA CATHETER REPAIR N/A 10/22/2022   Procedure: DIALYSIS/PERMA CATHETER REPAIR;  Surgeon: Jama Cordella MATSU, MD;  Location: [facility];  Service: Cardiovascular;  Laterality: N/A;   EXCHANGE OF A DIALYSIS CATHETER Right 04/10/2020   Procedure: EXCHANGE OF A DIALYSIS CATHETER;  Surgeon: Jordis Laneta FALCON, MD;  Location: [facility];  Service: General;   Laterality: Right;   INCISIONAL HERNIA REPAIR  01/20/2021   Procedure: HERNIA REPAIR INCISIONAL;  Surgeon: Desiderio Schanz, MD;  Location: [facility];  Service: General;;   IR IMAGE GUIDED DRAINAGE PERCUT CATH  PERITONEAL RETROPERIT  04/07/2020   LAPAROSCOPY N/A 04/16/2021   Procedure: LAPAROSCOPY DIAGNOSTIC;  Surgeon: Tye Millet, DO;  Location: [facility];  Service: General;  Laterality: N/A;   LAPAROTOMY N/A 04/26/2022   Procedure: EXPLORATORY LAPAROTOMY WITH REPAIR OF DUODENAL ULCER;  Surgeon: Rodolph Romano, MD;  Location: [facility];  Service: General;  Laterality: N/A;   LEFT HEART CATH AND CORONARY ANGIOGRAPHY Left 12/20/2018   Procedure: LEFT HEART CATH AND CORONARY ANGIOGRAPHY;  Surgeon: Ammon Blunt, MD;  Location: [facility];  Service: Cardiovascular;  Laterality: Left;   RADIAL ARTERY HARVEST Left 12/27/2018   Procedure: ENDOSCOPIC RADIAL ARTERY HARVEST;  Surgeon: German Bartlett PEDLAR, MD;  Location: [facility];  Service: Open Heart Surgery;  Laterality: Left;   REMOVAL OF A DIALYSIS CATHETER Left 03/20/2021   Procedure: REMOVAL OF A PD CATHETER;  Surgeon: Marea Selinda RAMAN, MD;  Location: [facility];  Service: Vascular;  Laterality: Left;   REVISION OF ARTERIOVENOUS GORETEX GRAFT Left 09/11/2021   Procedure: Excisionof infected AV graft;  Surgeon: Jama Cordella MATSU, MD;  Location: [facility];  Service: Vascular;  Laterality: Left;   TEE WITHOUT CARDIOVERSION N/A 12/27/2018   Procedure: TRANSESOPHAGEAL ECHOCARDIOGRAM (TEE);  Surgeon: German Bartlett PEDLAR, MD;  Location: [facility];  Service: Open Heart Surgery;  Laterality: N/A;   TUMOR REMOVAL  2019   Bladder    Family History  Problem Relation Age of Onset   Heart failure Mother     No Known Allergies     Latest Ref Rng & Units 11/16/2022    6:00 AM 11/16/2022    2:13 AM 10/21/2022    9:47 PM  CBC  WBC 4.0 - 10.5 K/uL 12.0  12.8  10.1   Hemoglobin 13.0 - 17.0 g/dL 89.7  88.7  89.6   Hematocrit 39.0 - 52.0 % 32.4  36.9  34.6    Platelets 150 - 400 K/uL 289  307  299       CMP     Component Value Date/Time   NA 138 11/17/2022 0914   NA 141 06/25/2022 1414   K 4.0 11/17/2022 0914   CL 99 11/17/2022 0914   CO2 22 11/17/2022 0914   GLUCOSE 177 (H) 11/17/2022 0914   BUN 61 (H) 11/17/2022 0914   BUN 39 (H) 06/25/2022 1414   CREATININE 6.65 (H) 11/17/2022 0914   CREATININE 4.62 (H) 01/10/2019 1351   CALCIUM  6.6 (L) 11/17/2022 0914   PROT 6.7 10/21/2022 2147   PROT 6.8 06/25/2022 1414   ALBUMIN  3.3 (L) 11/17/2022 0914   ALBUMIN  4.4 06/25/2022 1414   AST 18 10/21/2022 2147   ALT 25 10/21/2022 2147   ALKPHOS 54 10/21/2022 2147   BILITOT 0.4 10/21/2022 2147   BILITOT <0.2 06/25/2022 1414   EGFR 8 (L) 06/25/2022 1414   GFRNONAA 9 (L) 11/17/2022 0914     No results found.     Assessment & Plan:   1. Critical limb ischemia of right lower extremity (HCC) Recommend:  The patient has evidence of severe atherosclerotic changes of both lower extremities with rest pain that is associated with preulcerative changes and impending tissue loss of the right foot.  This represents a limb threatening ischemia and places the patient at the risk for right limb loss.  Patient should undergo angiography of the right lower extremity with the hope for intervention for limb salvage.  The risks and benefits as well as the alternative therapies was discussed in detail with the patient.  All questions were answered.  Patient agrees to proceed with right lower extremity angiography.  The patient will follow up with me in the office after the procedure.   Given the significance of his ischemia he was offered the next day angiogram, and initially schedule, however due to some of his family obligations and transportation issues he did not wish to undergo angiogram.  He was advised that this was a potentially limb threatening situation.  Despite this the patient will undergo intervention on Tuesday, 12/21/2022.  2. Primary  hypertension Continue antihypertensive medications as already ordered, these medications have been reviewed and there are no changes at this time.  3. Mixed hyperlipidemia Continue statin as ordered and reviewed, no changes at this time   Current Outpatient Medications on File Prior to Visit  Medication Sig Dispense Refill   amLODipine  (NORVASC ) 2.5 MG tablet Take 1 tablet (2.5 mg total) by mouth daily. 30 tablet 0   apixaban  (ELIQUIS ) 5 MG TABS tablet Take 1 tablet (5 mg total) by mouth 2 (two) times daily. 60 tablet 0   aspirin  EC 81 MG tablet Take 81 mg by mouth daily.     calcium  acetate (PHOSLO ) 667 MG capsule Take 1 capsule (667 mg total) by mouth 3 (three) times daily with meals. 30 capsule 0   isosorbide  dinitrate (ISORDIL ) 10 MG tablet Take 1 tablet (10 mg total) by mouth 3 (three) times daily. 90 tablet 0   sevelamer  carbonate (RENVELA ) 800 MG tablet Take 1,600 mg by mouth 3 (three) times daily.     pantoprazole  (PROTONIX ) 40 MG tablet Take 1 tablet (40 mg total) by mouth daily. (Patient not taking: Reported on 12/14/2022) 30 tablet 0   No current facility-administered medications on file prior to visit.    There are no Patient Instructions on file for this visit. No follow-ups on file.  Rooney Swails E Kindra Bickham, NP

## 2022-12-15 NOTE — H&P (View-Only) (Signed)
Subjective:    Patient ID: Anthony Hess, male    DOB: [birth year], 67 y.o.   MRN: [id number] Chief Complaint  Patient presents with   Follow-up    Ischemic right leg    The patient is a 67 year old male who returns today for evaluation of possible peripheral arterial disease in the setting of ongoing leg pain.  He notes that he has had this pain in his leg ongoing for the last several months.  He notes that he has had multiple DVT studies and other evaluations but ultimately he has not been able to detect a cause for his lower extremity pain.  Finally he was seen by [place] recently and scheduled to return for noninvasive studies.  He has symptoms that are also consistent with significant claudication and rest pain.  Today it is noted that he has a right ABI of 0 and a TBI of 0.  The left lower extremity has ABI of 1.01 with a TBI of 0 as well.  Additional arterial duplex shows occlusion of his SFA as well as what appears to be likely occlusion of all tibial vessels.  His left lower extremity is primarily patent until we reached the tibial vessels were transitioned to monophasic waveforms.    Review of Systems  Cardiovascular:        Claudication and rest pain  All other systems reviewed and are negative.      Objective:   Physical Exam Vitals reviewed.  HENT:     Head: Normocephalic.  Cardiovascular:     Rate and Rhythm: Normal rate.  Pulmonary:     Effort: Pulmonary effort is normal.  Skin:    General: Skin is warm and dry.  Neurological:     Mental Status: He is alert and oriented to person, place, and time.  Psychiatric:        Mood and Affect: Mood normal.        Behavior: Behavior normal.        Thought Content: Thought content normal.        Judgment: Judgment normal.     BP 92/60 (BP Location: Left Arm)   Pulse 79   Resp 15   Wt 116 lb (52.6 kg)   BMI 20.55 kg/m   Past Medical History:  Diagnosis Date   Aortic atherosclerosis (HCC)    Bilateral carotid artery  disease (HCC)    Bladder cancer (HCC)    Coronary artery disease 12/20/2018   a.) LHC 12/20/2018: 50% OM1, 40% OM2, 95% o-pLAD, 75% o=pLCx, 40% mLM, 70% D1, 60% mRCA-1, 50% mRCA-2; refer to CVTS. b.) 4v CABG at [facility] on 12/27/2018: LIMA-LAD, RIMA-PDA, seg LRA-OM1-D1   DCM (dilated cardiomyopathy) (HCC) 12/05/2018   a.) TTE 12/05/2018: EF 40-45%. b.) TTE 12/28/2019: EF 20-25%.   Dyspnea 10/15/2022   ESRD (end stage renal disease) (HCC)    a.) T-Th-Sat   HFrEF (heart failure with reduced ejection fraction) (HCC) 12/05/2018   a.) TTE 12/05/2018: EF 40-45%; mild LVH; ant/apical/sep HK; mild TR . b.) TTE 12/28/2019: EF 20-25%; mod LVH; mod MR/AR; G1DD.   History of 2019 novel coronavirus disease (COVID-19) 04/08/2021   History of kidney stones    HLD (hyperlipidemia)    Hx of CABG 12/27/2018   Hypertension    Infrarenal abdominal aortic aneurysm (AAA) without rupture (HCC) 03/05/2021   a.) CT abd/pelvis; measured 3.2 cm.   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   a.) LIMA-LAD, RIMA-PDA, sequential LEFT radial artery to OM1 and D1   Sepsis (HCC) 03/14/2021   Wears glasses     Social History   Socioeconomic History   Marital status: Married    Spouse name: Not on file   Number of children: Not on file   Years of education: Not on file   Highest education level: Not on file  Occupational History   Not on file  Tobacco Use   Smoking status: Every Day    Current packs/day: 0.00    Types: Cigarettes    Last attempt to quit: 02/19/2019    Years since quitting: 3.8   Smokeless tobacco: Never  Vaping Use   Vaping status: Never Used  Substance and Sexual Activity   Alcohol use: Never   Drug use: Never   Sexual activity: Yes  Other Topics Concern   Not on file  Social History Narrative   Not on file    Social Determinants of Health   Financial Resource Strain: Low Risk  (08/23/2022)   Overall Financial Resource Strain (CARDIA)    Difficulty of Paying Living Expenses: Not very hard  Food Insecurity: Food Insecurity Present (11/16/2022)   Hunger Vital Sign    Worried About Running Out of Food in the Last Year: Often true    Ran Out of Food in the Last Year: Often true  Transportation Needs: Unmet Transportation Needs (11/16/2022)   PRAPARE - Administrator, Civil Service (Medical): Yes    Lack of Transportation (Non-Medical): Yes  Physical Activity: Inactive (08/23/2022)   Exercise Vital Sign    Days of Exercise per Week: 0 days    Minutes of Exercise per Session: 0 min  Stress: No Stress Concern Present (08/23/2022)   [employer] of Occupational Health - Occupational Stress Questionnaire    Feeling of Stress : Not at all  Social Connections: Moderately Integrated (08/23/2022)   Social Connection and Isolation Panel [NHANES]    Frequency of Communication with Friends and Family: More than three times a week    Frequency of Social Gatherings with Friends and Family: Never    Attends Religious Services: More than 4 times per year    Active Member of Clubs or Organizations: No    Attends Banker Meetings: Never    Marital Status: Married  Catering manager Violence: Not At Risk (11/16/2022)   Humiliation, Afraid, Rape, and Kick questionnaire    Fear of Current or Ex-Partner: No    Emotionally Abused: No    Physically Abused: No    Sexually Abused: No    Past Surgical History:  Procedure Laterality Date   AV FISTULA PLACEMENT Left 07/30/2021   Procedure: INSERTION OF ARTERIOVENOUS (AV) GORE-TEX GRAFT ARM BRACHIAL ARTERY TO AXILLARY VEIN;  Surgeon: Annice Needy, MD;  Location: [facility];  Service: Vascular;  Laterality: Left;   CAPD INSERTION N/A 12/31/2019   Procedure: LAPAROSCOPIC INSERTION CONTINUOUS AMBULATORY PERITONEAL DIALYSIS  (CAPD) CATHETER;  Surgeon:  Leafy Ro, MD;  Location: [facility];  Service: General;  Laterality: N/A;   CAPD REMOVAL N/A 04/10/2020   Procedure: LAPAROSCOPIC REVISION OF CONTINUOUS AMBULATORY PERITONEAL DIALYSIS  (CAPD) CATHETER;  Surgeon: Leafy Ro, MD;  Location: [facility];  Service: General;  Laterality: N/A;   CORONARY ARTERY BYPASS GRAFT N/A 12/27/2018   Procedure: CORONARY ARTERY BYPASS GRAFTING (CABG) X  4 ON PUMP USING RIGHT & LEFT INTERNAL MAMMARY ARTERY LEFT RADIAL ARTERY ENDOSCOPICALLY HARVESTED;  Surgeon: Linden Dolin, MD;  [facility]: [facility];  Service: Open Heart Surgery;  Laterality: N/A;   CYSTOSCOPY W/ RETROGRADES Bilateral 05/15/2019   Procedure: CYSTOSCOPY WITH RETROGRADE PYELOGRAM;  Surgeon: Riki Altes, MD;  Location: [facility];  Service: Urology;  Laterality: Bilateral;   CYSTOSCOPY WITH BIOPSY N/A 05/15/2019   Procedure: CYSTOSCOPY WITH bladder BIOPSY;  Surgeon: Riki Altes, MD;  Location: [facility];  Service: Urology;  Laterality: N/A;   DIALYSIS/PERMA CATHETER INSERTION N/A 12/28/2019   Procedure: DIALYSIS/PERMA CATHETER INSERTION;  Surgeon: Annice Needy, MD;  Location: [facility];  Service: Cardiovascular;  Laterality: N/A;   DIALYSIS/PERMA CATHETER INSERTION N/A 03/18/2021   Procedure: DIALYSIS/PERMA CATHETER INSERTION;  Surgeon: Annice Needy, MD;  Location: [facility];  Service: Cardiovascular;  Laterality: N/A;   DIALYSIS/PERMA CATHETER REMOVAL N/A 06/02/2020   Procedure: DIALYSIS/PERMA CATHETER REMOVAL;  Surgeon: Annice Needy, MD;  Location: [facility];  Service: Cardiovascular;  Laterality: N/A;   DIALYSIS/PERMA CATHETER REPAIR N/A 10/22/2022   Procedure: DIALYSIS/PERMA CATHETER REPAIR;  Surgeon: Renford Dills, MD;  Location: [facility];  Service: Cardiovascular;  Laterality: N/A;   EXCHANGE OF A DIALYSIS CATHETER Right 04/10/2020   Procedure: EXCHANGE OF A DIALYSIS CATHETER;  Surgeon: Leafy Ro, MD;  Location: [facility];  Service: General;   Laterality: Right;   INCISIONAL HERNIA REPAIR  01/20/2021   Procedure: HERNIA REPAIR INCISIONAL;  Surgeon: Henrene Dodge, MD;  Location: [facility];  Service: General;;   IR IMAGE GUIDED DRAINAGE PERCUT CATH  PERITONEAL RETROPERIT  04/07/2020   LAPAROSCOPY N/A 04/16/2021   Procedure: LAPAROSCOPY DIAGNOSTIC;  Surgeon: Sung Amabile, DO;  Location: [facility];  Service: General;  Laterality: N/A;   LAPAROTOMY N/A 04/26/2022   Procedure: EXPLORATORY LAPAROTOMY WITH REPAIR OF DUODENAL ULCER;  Surgeon: Carolan Shiver, MD;  Location: [facility];  Service: General;  Laterality: N/A;   LEFT HEART CATH AND CORONARY ANGIOGRAPHY Left 12/20/2018   Procedure: LEFT HEART CATH AND CORONARY ANGIOGRAPHY;  Surgeon: Marcina Millard, MD;  Location: [facility];  Service: Cardiovascular;  Laterality: Left;   RADIAL ARTERY HARVEST Left 12/27/2018   Procedure: ENDOSCOPIC RADIAL ARTERY HARVEST;  Surgeon: Linden Dolin, MD;  Location: [facility];  Service: Open Heart Surgery;  Laterality: Left;   REMOVAL OF A DIALYSIS CATHETER Left 03/20/2021   Procedure: REMOVAL OF A PD CATHETER;  Surgeon: Annice Needy, MD;  Location: [facility];  Service: Vascular;  Laterality: Left;   REVISION OF ARTERIOVENOUS GORETEX GRAFT Left 09/11/2021   Procedure: Excisionof infected AV graft;  Surgeon: Renford Dills, MD;  Location: [facility];  Service: Vascular;  Laterality: Left;   TEE WITHOUT CARDIOVERSION N/A 12/27/2018   Procedure: TRANSESOPHAGEAL ECHOCARDIOGRAM (TEE);  Surgeon: Linden Dolin, MD;  Location: [facility] OR;  Service: Open Heart Surgery;  Laterality: N/A;   TUMOR REMOVAL  2019   Bladder    Family History  Problem Relation Age of Onset   Heart failure Mother     No Known Allergies     Latest Ref Rng & Units 11/16/2022    6:00 AM 11/16/2022    2:13 AM 10/21/2022    9:47 PM  CBC  WBC 4.0 - 10.5 K/uL 12.0  12.8  10.1   Hemoglobin 13.0 - 17.0 g/dL 16.1  09.6  04.5   Hematocrit 39.0 - 52.0 % 32.4  36.9  34.6    Platelets 150 - 400 K/uL 289  307  299       CMP     Component Value Date/Time   NA 138 11/17/2022 0914   NA 141 06/25/2022 1414   K 4.0 11/17/2022 0914   CL 99 11/17/2022 0914   CO2 22 11/17/2022 0914   GLUCOSE 177 (H) 11/17/2022 0914   BUN 61 (H) 11/17/2022 0914   BUN 39 (H) 06/25/2022 1414   CREATININE 6.65 (H) 11/17/2022 0914   CREATININE 4.62 (H) 01/10/2019 1351   CALCIUM 6.6 (L) 11/17/2022 0914   PROT 6.7 10/21/2022 2147   PROT 6.8 06/25/2022 1414   ALBUMIN 3.3 (L) 11/17/2022 0914   ALBUMIN 4.4 06/25/2022 1414   AST 18 10/21/2022 2147   ALT 25 10/21/2022 2147   ALKPHOS 54 10/21/2022 2147   BILITOT 0.4 10/21/2022 2147   BILITOT <0.2 06/25/2022 1414   EGFR 8 (L) 06/25/2022 1414   GFRNONAA 9 (L) 11/17/2022 0914     No results found.     Assessment & Plan:   1. Critical limb ischemia of right lower extremity (HCC) Recommend:  The patient has evidence of severe atherosclerotic changes of both lower extremities with rest pain that is associated with preulcerative changes and impending tissue loss of the right foot.  This represents a limb threatening ischemia and places the patient at the risk for right limb loss.  Patient should undergo angiography of the right lower extremity with the hope for intervention for limb salvage.  The risks and benefits as well as the alternative therapies was discussed in detail with the patient.  All questions were answered.  Patient agrees to proceed with right lower extremity angiography.  The patient will follow up with me in the office after the procedure.   Given the significance of his ischemia he was offered the next day angiogram, and initially schedule, however due to some of his family obligations and transportation issues he did not wish to undergo angiogram.  He was advised that this was a potentially limb threatening situation.  Despite this the patient will undergo intervention on Tuesday, 12/21/2022.  2. Primary  hypertension Continue antihypertensive medications as already ordered, these medications have been reviewed and there are no changes at this time.  3. Mixed hyperlipidemia Continue statin as ordered and reviewed, no changes at this time   Current Outpatient Medications on File Prior to Visit  Medication Sig Dispense Refill   amLODipine (NORVASC) 2.5 MG tablet Take 1 tablet (2.5 mg total) by mouth daily. 30 tablet 0   apixaban (ELIQUIS) 5 MG TABS tablet Take 1 tablet (5 mg total) by mouth 2 (two) times daily. 60 tablet 0   aspirin EC 81 MG tablet Take 81 mg by mouth daily.     calcium acetate (PHOSLO) 667 MG capsule Take 1 capsule (667 mg total) by mouth 3 (three) times daily with meals. 30 capsule 0   isosorbide dinitrate (ISORDIL) 10 MG tablet Take 1 tablet (10 mg total) by mouth 3 (three) times daily. 90 tablet 0   sevelamer carbonate (RENVELA) 800 MG tablet Take 1,600 mg by mouth 3 (three) times daily.     pantoprazole (PROTONIX) 40 MG tablet Take 1 tablet (40 mg total) by mouth daily. (Patient not taking: Reported on 12/14/2022) 30 tablet 0   No current facility-administered medications on file prior to visit.    There are no Patient Instructions on file for this visit. No follow-ups on file.  Georgiana Spinner, NP

## 2022-12-17 ENCOUNTER — Telehealth (INDEPENDENT_AMBULATORY_CARE_PROVIDER_SITE_OTHER): Payer: Self-pay

## 2022-12-17 ENCOUNTER — Ambulatory Visit: Payer: Self-pay

## 2022-12-17 NOTE — Telephone Encounter (Signed)
Unfortunately the Percocet is usually the strongest we send out.  He can try doubling to see if it helps.  However he has very poor flow to his foot so the only true way to get him out of pain is to fix it.  Unfortunately, he was unable to have the procedure done on Thursday as we had originally discussed.

## 2022-12-17 NOTE — Telephone Encounter (Addendum)
Called FC, Carol regarding pt. She advised referring pt back to [facility]. If unable to contact them, pt will need to go to ED.   Chief Complaint: Extreme leg pain Symptoms: above Frequency: ongoing Pertinent Negatives: Patient denies  Disposition: [] ED /[] Urgent Care (no appt availability in office) / [] Appointment(In office/virtual)/ []  [facility]/ [] Home Care/ [] Refused Recommended Disposition /[] [facility]/ [x]  Follow-up with PCP Additional Notes: Pt was seen by vein specialist and given percocet. Pt states that medication is not at all effective. Gave pt and his wife the phone number for Vein specialist. They will call there for help with pain medication. Pt may call back if unable to reach vein specialist.   Please Advise.  Reason for Disposition  [1] SEVERE pain (e.g., excruciating, unable to do any normal activities) AND [2] not improved after 2 hours of pain medicine  Answer Assessment - Initial Assessment Questions 1. ONSET: "When did the pain start?"      Ongoing 2. LOCATION: "Where is the pain located?"      Right leg 3. PAIN: "How bad is the pain?"    (Scale 1-10; or mild, moderate, severe)   -  MILD (1-3): doesn't interfere with normal activities    -  MODERATE (4-7): interferes with normal activities (e.g., work or school) or awakens from sleep, limping    -  SEVERE (8-10): excruciating pain, unable to do any normal activities, unable to walk     Severe  5. CAUSE: "What do you think is causing the leg pain?"      Critical limb ischemia of right lower extremity (HCC)  Protocols used: Leg Pain-A-AH

## 2022-12-17 NOTE — Telephone Encounter (Signed)
Patient called again and I gave the recommendation from Sheppard Plumber NP. See notes below. Patient and spouse stated they were not aware of how pain medication is taken.

## 2022-12-17 NOTE — Telephone Encounter (Signed)
Patient called stating he was seen Wednesday 12/15/22 for blockages in his legs and he has surgery next Tuesday.12/21/22. However, the medication he was prescribed is not working and he's still in pain. He would like something else.   Please advise

## 2022-12-17 NOTE — Telephone Encounter (Signed)
Agree with recommendation to discuss symptoms with vein & vascular for leg pain   Ronnald Ramp, MD

## 2022-12-17 NOTE — Progress Notes (Deleted)
Established patient visit   Patient: Anthony Hess   DOB: [birth year]   67 y.o. Male  MRN: [id number] Visit Date: 12/20/2022  Today's healthcare provider: Ronnald Ramp, MD   No chief complaint on file.  Subjective       Discussed the use of AI scribe software for clinical note transcription with the patient, who gave verbal consent to proceed.  History of Present Illness             Past Medical History:  Diagnosis Date   Aortic atherosclerosis (HCC)    Bilateral carotid artery disease (HCC)    Bladder cancer (HCC)    Coronary artery disease 12/20/2018   a.) LHC 12/20/2018: 50% OM1, 40% OM2, 95% o-pLAD, 75% o=pLCx, 40% mLM, 70% D1, 60% mRCA-1, 50% mRCA-2; refer to CVTS. b.) 4v CABG at [facility] on 12/27/2018: LIMA-LAD, RIMA-PDA, seg LRA-OM1-D1   DCM (dilated cardiomyopathy) (HCC) 12/05/2018   a.) TTE 12/05/2018: EF 40-45%. b.) TTE 12/28/2019: EF 20-25%.   Dyspnea 10/15/2022   ESRD (end stage renal disease) (HCC)    a.) T-Th-Sat   HFrEF (heart failure with reduced ejection fraction) (HCC) 12/05/2018   a.) TTE 12/05/2018: EF 40-45%; mild LVH; ant/apical/sep HK; mild TR . b.) TTE 12/28/2019: EF 20-25%; mod LVH; mod MR/AR; G1DD.   History of 2019 novel coronavirus disease (COVID-19) 04/08/2021   History of kidney stones    HLD (hyperlipidemia)    Hx of CABG 12/27/2018   Hypertension    Infrarenal abdominal aortic aneurysm (AAA) without rupture (HCC) 03/05/2021   a.) CT abd/pelvis; measured 3.2 cm.   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   apixaban (ELIQUIS) 5 MG TABS tablet Take 1 tablet (5 mg total) by mouth 2 (two) times daily.   aspirin EC 81 MG tablet Take 81 mg by mouth daily.   calcium acetate (PHOSLO) 667 MG capsule Take 1 capsule (667 mg total) by mouth 3 (three) times daily with meals.   isosorbide dinitrate (ISORDIL) 10 MG tablet Take 1 tablet (10 mg total) by mouth 3 (three) times daily.   oxyCODONE-acetaminophen (PERCOCET/ROXICET) 5-325 MG tablet Take 1 tablet by mouth every 4 (four) hours as needed for severe pain (pain score 7-10).   pantoprazole (PROTONIX) 40 MG tablet Take 1 tablet (40 mg total) by mouth daily. (Patient not taking: Reported on 12/14/2022)   sevelamer carbonate (RENVELA) 800 MG tablet Take 1,600 mg by mouth 3 (three) times daily.   No facility-administered medications prior to visit.    Review of Systems  {Insert previous labs (optional):23779} {See past labs  Heme  Chem  Endocrine  Serology  Results Review (optional):1}   Objective    There were no vitals taken for this visit. {Insert last BP/Wt (optional):23777}{See vitals history (optional):1}   Physical Exam  ***  No results found for any visits on 12/20/22.  Assessment & Plan     Problem List Items Addressed This Visit   None   Assessment and Plan  No follow-ups on file.         Ronnald Ramp, MD  [employer] [phone] (phone) [phone] (fax)  [facility]

## 2022-12-17 NOTE — Telephone Encounter (Signed)
ERROR

## 2022-12-20 ENCOUNTER — Ambulatory Visit: Payer: Medicare Other | Admitting: Family Medicine

## 2022-12-20 LAB — VAS US ABI WITH/WO TBI
Left ABI: 1.01
Right ABI: 0

## 2022-12-21 ENCOUNTER — Encounter: Payer: Self-pay | Admitting: Vascular Surgery

## 2022-12-21 ENCOUNTER — Encounter
Admission: RE | Disposition: A | Discharge status: Home / Self Care | Payer: Self-pay | Source: Home / Self Care | Attending: Internal Medicine

## 2022-12-21 ENCOUNTER — Inpatient Hospital Stay
Admission: RE | Admit: 2022-12-21 | Discharge: 2022-12-26 | DRG: 252 | Disposition: A | Discharge status: Home / Self Care | Payer: Medicare Other | Attending: Internal Medicine | Admitting: Internal Medicine

## 2022-12-21 ENCOUNTER — Other Ambulatory Visit: Payer: Self-pay

## 2022-12-21 DIAGNOSIS — Z681 Body mass index (BMI) 19 or less, adult: Secondary | ICD-10-CM | POA: Diagnosis not present

## 2022-12-21 DIAGNOSIS — Z9861 Coronary angioplasty status: Secondary | ICD-10-CM

## 2022-12-21 DIAGNOSIS — I4891 Unspecified atrial fibrillation: Secondary | ICD-10-CM | POA: Diagnosis not present

## 2022-12-21 DIAGNOSIS — I132 Hypertensive heart and chronic kidney disease with heart failure and with stage 5 chronic kidney disease, or end stage renal disease: Secondary | ICD-10-CM | POA: Diagnosis present

## 2022-12-21 DIAGNOSIS — Z8616 Personal history of COVID-19: Secondary | ICD-10-CM

## 2022-12-21 DIAGNOSIS — Z91199 Patient's noncompliance with other medical treatment and regimen due to unspecified reason: Secondary | ICD-10-CM

## 2022-12-21 DIAGNOSIS — R64 Cachexia: Secondary | ICD-10-CM | POA: Diagnosis present

## 2022-12-21 DIAGNOSIS — I700 Atherosclerosis of aorta: Secondary | ICD-10-CM | POA: Diagnosis present

## 2022-12-21 DIAGNOSIS — L7602 Intraoperative hemorrhage and hematoma of skin and subcutaneous tissue complicating other procedure: Secondary | ICD-10-CM | POA: Diagnosis not present

## 2022-12-21 DIAGNOSIS — Z7982 Long term (current) use of aspirin: Secondary | ICD-10-CM

## 2022-12-21 DIAGNOSIS — I482 Chronic atrial fibrillation, unspecified: Secondary | ICD-10-CM | POA: Diagnosis present

## 2022-12-21 DIAGNOSIS — I480 Paroxysmal atrial fibrillation: Secondary | ICD-10-CM | POA: Diagnosis present

## 2022-12-21 DIAGNOSIS — I998 Other disorder of circulatory system: Principal | ICD-10-CM | POA: Diagnosis present

## 2022-12-21 DIAGNOSIS — I70221 Atherosclerosis of native arteries of extremities with rest pain, right leg: Secondary | ICD-10-CM | POA: Diagnosis present

## 2022-12-21 DIAGNOSIS — I252 Old myocardial infarction: Secondary | ICD-10-CM

## 2022-12-21 DIAGNOSIS — Z7901 Long term (current) use of anticoagulants: Secondary | ICD-10-CM | POA: Diagnosis not present

## 2022-12-21 DIAGNOSIS — F17210 Nicotine dependence, cigarettes, uncomplicated: Secondary | ICD-10-CM | POA: Diagnosis present

## 2022-12-21 DIAGNOSIS — I10 Essential (primary) hypertension: Secondary | ICD-10-CM | POA: Diagnosis present

## 2022-12-21 DIAGNOSIS — Z5982 Transportation insecurity: Secondary | ICD-10-CM

## 2022-12-21 DIAGNOSIS — E782 Mixed hyperlipidemia: Secondary | ICD-10-CM | POA: Diagnosis present

## 2022-12-21 DIAGNOSIS — R636 Underweight: Secondary | ICD-10-CM | POA: Diagnosis present

## 2022-12-21 DIAGNOSIS — Z79899 Other long term (current) drug therapy: Secondary | ICD-10-CM

## 2022-12-21 DIAGNOSIS — N186 End stage renal disease: Secondary | ICD-10-CM | POA: Diagnosis present

## 2022-12-21 DIAGNOSIS — D631 Anemia in chronic kidney disease: Secondary | ICD-10-CM | POA: Diagnosis present

## 2022-12-21 DIAGNOSIS — E875 Hyperkalemia: Secondary | ICD-10-CM | POA: Diagnosis present

## 2022-12-21 DIAGNOSIS — I5032 Chronic diastolic (congestive) heart failure: Secondary | ICD-10-CM | POA: Diagnosis present

## 2022-12-21 DIAGNOSIS — Z8249 Family history of ischemic heart disease and other diseases of the circulatory system: Secondary | ICD-10-CM

## 2022-12-21 DIAGNOSIS — Z6820 Body mass index (BMI) 20.0-20.9, adult: Secondary | ICD-10-CM

## 2022-12-21 DIAGNOSIS — Z5941 Food insecurity: Secondary | ICD-10-CM

## 2022-12-21 DIAGNOSIS — F17200 Nicotine dependence, unspecified, uncomplicated: Secondary | ICD-10-CM | POA: Diagnosis present

## 2022-12-21 DIAGNOSIS — E1122 Type 2 diabetes mellitus with diabetic chronic kidney disease: Secondary | ICD-10-CM | POA: Diagnosis present

## 2022-12-21 DIAGNOSIS — Z87442 Personal history of urinary calculi: Secondary | ICD-10-CM

## 2022-12-21 DIAGNOSIS — Z86711 Personal history of pulmonary embolism: Secondary | ICD-10-CM | POA: Diagnosis not present

## 2022-12-21 DIAGNOSIS — I2510 Atherosclerotic heart disease of native coronary artery without angina pectoris: Secondary | ICD-10-CM | POA: Diagnosis present

## 2022-12-21 DIAGNOSIS — N2581 Secondary hyperparathyroidism of renal origin: Secondary | ICD-10-CM | POA: Diagnosis present

## 2022-12-21 DIAGNOSIS — I714 Abdominal aortic aneurysm, without rupture, unspecified: Secondary | ICD-10-CM | POA: Diagnosis present

## 2022-12-21 DIAGNOSIS — I959 Hypotension, unspecified: Secondary | ICD-10-CM | POA: Diagnosis present

## 2022-12-21 DIAGNOSIS — Z992 Dependence on renal dialysis: Secondary | ICD-10-CM

## 2022-12-21 DIAGNOSIS — I420 Dilated cardiomyopathy: Secondary | ICD-10-CM | POA: Diagnosis present

## 2022-12-21 DIAGNOSIS — Z951 Presence of aortocoronary bypass graft: Secondary | ICD-10-CM

## 2022-12-21 DIAGNOSIS — R001 Bradycardia, unspecified: Secondary | ICD-10-CM | POA: Diagnosis present

## 2022-12-21 DIAGNOSIS — Z8551 Personal history of malignant neoplasm of bladder: Secondary | ICD-10-CM

## 2022-12-21 DIAGNOSIS — Z716 Tobacco abuse counseling: Secondary | ICD-10-CM

## 2022-12-21 DIAGNOSIS — I5030 Unspecified diastolic (congestive) heart failure: Secondary | ICD-10-CM | POA: Diagnosis present

## 2022-12-21 DIAGNOSIS — I70229 Atherosclerosis of native arteries of extremities with rest pain, unspecified extremity: Secondary | ICD-10-CM

## 2022-12-21 LAB — CBC WITH DIFFERENTIAL/PLATELET
Abs Immature Granulocytes: 0.07 10*3/uL (ref 0.00–0.07)
Basophils Absolute: 0.1 10*3/uL (ref 0.0–0.1)
Basophils Relative: 1 %
Eosinophils Absolute: 0.4 10*3/uL (ref 0.0–0.5)
Eosinophils Relative: 4 %
HCT: 37.7 % — ABNORMAL LOW (ref 39.0–52.0)
Hemoglobin: 12.2 g/dL — ABNORMAL LOW (ref 13.0–17.0)
Immature Granulocytes: 1 %
Lymphocytes Relative: 9 %
Lymphs Abs: 0.9 10*3/uL (ref 0.7–4.0)
MCH: 29.4 pg (ref 26.0–34.0)
MCHC: 32.4 g/dL (ref 30.0–36.0)
MCV: 90.8 fL (ref 80.0–100.0)
Monocytes Absolute: 1.3 10*3/uL — ABNORMAL HIGH (ref 0.1–1.0)
Monocytes Relative: 13 %
Neutro Abs: 7.1 10*3/uL (ref 1.7–7.7)
Neutrophils Relative %: 72 %
Platelets: 292 10*3/uL (ref 150–400)
RBC: 4.15 MIL/uL — ABNORMAL LOW (ref 4.22–5.81)
RDW: 17.4 % — ABNORMAL HIGH (ref 11.5–15.5)
WBC: 9.8 10*3/uL (ref 4.0–10.5)
nRBC: 0 % (ref 0.0–0.2)

## 2022-12-21 LAB — GLUCOSE, CAPILLARY
Glucose-Capillary: 62 mg/dL — ABNORMAL LOW (ref 70–99)
Glucose-Capillary: 74 mg/dL (ref 70–99)

## 2022-12-21 LAB — COMPREHENSIVE METABOLIC PANEL
ALT: 20 U/L (ref 0–44)
AST: 58 U/L — ABNORMAL HIGH (ref 15–41)
Albumin: 3.2 g/dL — ABNORMAL LOW (ref 3.5–5.0)
Alkaline Phosphatase: 68 U/L (ref 38–126)
Anion gap: 19 — ABNORMAL HIGH (ref 5–15)
BUN: 62 mg/dL — ABNORMAL HIGH (ref 8–23)
CO2: 20 mmol/L — ABNORMAL LOW (ref 22–32)
Calcium: 6.5 mg/dL — ABNORMAL LOW (ref 8.9–10.3)
Chloride: 95 mmol/L — ABNORMAL LOW (ref 98–111)
Creatinine, Ser: 8.31 mg/dL — ABNORMAL HIGH (ref 0.61–1.24)
GFR, Estimated: 7 mL/min — ABNORMAL LOW (ref >60)
Glucose, Bld: 80 mg/dL (ref 70–99)
Potassium: 5.5 mmol/L — ABNORMAL HIGH (ref 3.5–5.1)
Sodium: 134 mmol/L — ABNORMAL LOW (ref 135–145)
Total Bilirubin: 0.7 mg/dL (ref 0.3–1.2)
Total Protein: 6.7 g/dL (ref 6.5–8.1)

## 2022-12-21 LAB — POTASSIUM (ARMC VASCULAR LAB ONLY): Potassium (ARMC vascular lab): 5.4 mmol/L — ABNORMAL HIGH (ref 3.5–5.1)

## 2022-12-21 LAB — APTT: aPTT: 36 s (ref 24–36)

## 2022-12-21 LAB — HEPARIN LEVEL (UNFRACTIONATED)
Heparin Unfractionated: <0.1 [IU]/mL — ABNORMAL LOW (ref 0.30–0.70)
Heparin Unfractionated: 0.4 [IU]/mL (ref 0.30–0.70)

## 2022-12-21 LAB — HEPATITIS B SURFACE ANTIGEN: Hepatitis B Surface Ag: NONREACTIVE

## 2022-12-21 LAB — PROTIME-INR
INR: 1.3 — ABNORMAL HIGH (ref 0.8–1.2)
Prothrombin Time: 15.9 s — ABNORMAL HIGH (ref 11.4–15.2)

## 2022-12-21 LAB — MRSA NEXT GEN BY PCR, NASAL: MRSA by PCR Next Gen: NOT DETECTED

## 2022-12-21 SURGERY — LOWER EXTREMITY ANGIOGRAPHY
Anesthesia: Moderate Sedation | Site: Leg Lower | Laterality: Right

## 2022-12-21 MED ORDER — ONDANSETRON HCL 4 MG/2ML IJ SOLN: 4.0000 mg | Freq: Four times a day (QID) | INTRAMUSCULAR | Status: DC | PRN

## 2022-12-21 MED ORDER — METHYLPREDNISOLONE SODIUM SUCC 125 MG IJ SOLR: 125.0000 mg | Freq: Once | INTRAMUSCULAR | Status: DC | PRN

## 2022-12-21 MED ORDER — HEPARIN (PORCINE) 25000 UT/250ML-% IV SOLN
1400.0000 [IU]/h | INTRAVENOUS | Status: DC
  Administered 2022-12-21: 900 [IU]/h via INTRAVENOUS
  Administered 2022-12-22: 1050 [IU]/h via INTRAVENOUS
  Administered 2022-12-23 (×2): 1250 [IU]/h via INTRAVENOUS
  Administered 2022-12-24: 1400 [IU]/h via INTRAVENOUS
  Filled 2022-12-21 (×4): qty 250

## 2022-12-21 MED ORDER — SEVELAMER CARBONATE 800 MG PO TABS
1600.0000 mg | ORAL_TABLET | Freq: Three times a day (TID) | ORAL | Status: DC
  Administered 2022-12-21 – 2022-12-26 (×13): 1600 mg via ORAL
  Filled 2022-12-21 (×13): qty 2

## 2022-12-21 MED ORDER — OXYCODONE-ACETAMINOPHEN 5-325 MG PO TABS
1.0000 | ORAL_TABLET | ORAL | Status: DC | PRN
  Administered 2022-12-22 – 2022-12-26 (×13): 1 via ORAL
  Filled 2022-12-21 (×13): qty 1

## 2022-12-21 MED ORDER — ALTEPLASE 2 MG IJ SOLR
2.0000 mg | Freq: Once | INTRAMUSCULAR | Status: DC | PRN
Start: 2022-12-21 — End: 2022-12-23

## 2022-12-21 MED ORDER — HEPARIN SODIUM (PORCINE) 1000 UNIT/ML IJ SOLN
INTRAMUSCULAR | Status: AC
  Filled 2022-12-21: qty 10

## 2022-12-21 MED ORDER — AMLODIPINE BESYLATE 5 MG PO TABS: 2.5000 mg | ORAL_TABLET | Freq: Every day | ORAL | Status: DC

## 2022-12-21 MED ORDER — LIDOCAINE HCL (PF) 1 % IJ SOLN: 5.0000 mL | INTRAMUSCULAR | Status: DC | PRN

## 2022-12-21 MED ORDER — HEPARIN SODIUM (PORCINE) 1000 UNIT/ML DIALYSIS
1000.0000 [IU] | INTRAMUSCULAR | Status: DC | PRN
Start: 2022-12-21 — End: 2022-12-23

## 2022-12-21 MED ORDER — SODIUM CHLORIDE 0.9 % IV SOLN: INTRAVENOUS | Status: DC

## 2022-12-21 MED ORDER — ALBUMIN HUMAN 25 % IV SOLN
50.0000 g | Freq: Once | INTRAVENOUS | Status: AC
  Administered 2022-12-21: 50 g via INTRAVENOUS
  Filled 2022-12-21 (×2): qty 200

## 2022-12-21 MED ORDER — FAMOTIDINE 20 MG PO TABS: 40.0000 mg | ORAL_TABLET | Freq: Once | ORAL | Status: DC | PRN

## 2022-12-21 MED ORDER — LIDOCAINE-PRILOCAINE 2.5-2.5 % EX CREA
1.0000 | TOPICAL_CREAM | CUTANEOUS | Status: DC | PRN
Start: 2022-12-21 — End: 2022-12-23

## 2022-12-21 MED ORDER — ANTICOAGULANT SODIUM CITRATE 4% (200MG/5ML) IV SOLN
5.0000 mL | Status: DC | PRN
Start: 2022-12-21 — End: 2022-12-23

## 2022-12-21 MED ORDER — HEPARIN BOLUS VIA INFUSION
3200.0000 [IU] | Freq: Once | INTRAVENOUS | Status: AC
  Administered 2022-12-21: 3200 [IU] via INTRAVENOUS
  Filled 2022-12-21: qty 3200

## 2022-12-21 MED ORDER — NEPRO/CARBSTEADY PO LIQD
237.0000 mL | ORAL | Status: DC | PRN
Start: 2022-12-21 — End: 2022-12-23

## 2022-12-21 MED ORDER — PENTAFLUOROPROP-TETRAFLUOROETH EX AERO
1.0000 | INHALATION_SPRAY | CUTANEOUS | Status: DC | PRN
Start: 2022-12-21 — End: 2022-12-23

## 2022-12-21 MED ORDER — MIDAZOLAM HCL 2 MG/ML PO SYRP: 8.0000 mg | ORAL_SOLUTION | Freq: Once | ORAL | Status: DC | PRN

## 2022-12-21 MED ORDER — DIPHENHYDRAMINE HCL 50 MG/ML IJ SOLN: 50.0000 mg | Freq: Once | INTRAMUSCULAR | Status: DC | PRN

## 2022-12-21 MED ORDER — CALCIUM ACETATE (PHOS BINDER) 667 MG PO CAPS: 667.0000 mg | ORAL_CAPSULE | Freq: Three times a day (TID) | ORAL | Status: DC

## 2022-12-21 MED ORDER — CHLORHEXIDINE GLUCONATE CLOTH 2 % EX PADS
6.0000 | MEDICATED_PAD | Freq: Every day | CUTANEOUS | Status: DC
  Administered 2022-12-21 – 2022-12-26 (×6): 6 via TOPICAL

## 2022-12-21 MED ORDER — HYDROMORPHONE HCL 1 MG/ML IJ SOLN
1.0000 mg | Freq: Once | INTRAMUSCULAR | Status: AC | PRN
  Administered 2022-12-22: 1 mg via INTRAVENOUS
  Filled 2022-12-21: qty 1

## 2022-12-21 MED ORDER — CALCIUM ACETATE (PHOS BINDER) 667 MG PO CAPS
1334.0000 mg | ORAL_CAPSULE | Freq: Three times a day (TID) | ORAL | Status: DC
  Administered 2022-12-21 – 2022-12-26 (×12): 1334 mg via ORAL
  Filled 2022-12-21 (×15): qty 2

## 2022-12-21 MED ORDER — CEFAZOLIN SODIUM-DEXTROSE 2-4 GM/100ML-% IV SOLN: 2.0000 g | INTRAVENOUS | Status: DC

## 2022-12-21 MED ORDER — MIDODRINE HCL 5 MG PO TABS
10.0000 mg | ORAL_TABLET | Freq: Three times a day (TID) | ORAL | Status: DC
  Administered 2022-12-21 – 2022-12-26 (×11): 10 mg via ORAL
  Filled 2022-12-21 (×10): qty 2

## 2022-12-21 MED ORDER — SODIUM CHLORIDE 0.9% FLUSH
10.0000 mL | Freq: Two times a day (BID) | INTRAVENOUS | Status: DC
  Administered 2022-12-21 – 2022-12-26 (×11): 10 mL via INTRAVENOUS

## 2022-12-21 MED ORDER — ASPIRIN 81 MG PO TBEC
81.0000 mg | DELAYED_RELEASE_TABLET | Freq: Every day | ORAL | Status: DC
Start: 2022-12-21 — End: 2022-12-26
  Administered 2022-12-21 – 2022-12-26 (×5): 81 mg via ORAL
  Filled 2022-12-21 (×5): qty 1

## 2022-12-21 MED ORDER — CEFAZOLIN SODIUM-DEXTROSE 2-4 GM/100ML-% IV SOLN
INTRAVENOUS | Status: AC
Start: 2022-12-21 — End: ?
  Filled 2022-12-21: qty 100

## 2022-12-21 MED ORDER — ONDANSETRON HCL 4 MG PO TABS: 4.0000 mg | ORAL_TABLET | Freq: Four times a day (QID) | ORAL | Status: DC | PRN

## 2022-12-21 NOTE — Assessment & Plan Note (Signed)
EKG pending  Rate controlled at present  On eliquis  Transitioned to heparin gtt in setting of limb ischemia  Follow

## 2022-12-21 NOTE — Progress Notes (Signed)
Potassium came back at 5.4, vascular lab notified.  Per vascular lab, procedure will not be done today, patient will have to be admitted.

## 2022-12-21 NOTE — Discharge Instructions (Addendum)
Vascular Surgery  Do not lift anything heavy for the next two weeks.   You may shower tomorrow after you get home. Shower with the old dressing in place and remove immediately afterwards. Replace it with a band aid for the next 3 days.   You will need to take Aspirin 81 mg daily, Eliquis 5 mg daily, Lipitor 20 mg daily. Do not skip doses of this medications or you risk losing blood flow to your right foot and risking amputation.   Follow up in Vein and Vascular Surgery as scheduled.            Food Resources  Agency Name: [facility] Address: [address] [place], [place] [address] Phone: [phone] Website: [email or web address] Service(s) Offered: Housing services, self-sufficiency, congregate meal program, weatherization program, Event organiser program, emergency food assistance,  housing counseling, home ownership program, wheels - to work program.  [employer] free for 60 and older at various locations from [employer], Monday-Friday:  [employer], [address] [place], [phone] -[facility], [address], [place] [phone]  -[facility], [address], [place] [phone]  -[address], [place], [phone]  Agency Name: [facility] Address: [id number] [address], [place], [place] [address] Phone: [phone] Website: [email or web address] Service(s) Offered: Home delivered hot, frozen, and emergency  meals. Grocery assistance program which matches  volunteers one-on-one with seniors unable to grocery shop  for themselves. Must be 60 years and older; less than 20  hours of in-home aide service, limited or no driving ability;  live alone or with someone with a disability; live in  [place].  Agency Name: [facility]ologist [facility] [employer]) Address: [address], [place], [place] [address] Phone:  [phone] Service(s) Offered: Food is served to shut-ins, homeless, elderly, and low income people in the community every Saturday (11:30 am-12:30 pm) and Sunday (12:30 pm-1:30pm). Volunteers also offer help and encouragement in seeking employment,  and spiritual guidance.  Agency Name: Department [facility] Social Services Address: [address] [place], [place] [address] Phone: [phone] Service(s) Offered: Child support services; child welfare services; food stamps; Medicaid; work first family assistance; and aid with fuel,  rent, food and medicine.  Agency Name: Dietitian Address: [address], [place], [place] Phone: [phone] Website: [email or web address] Services Offered: Monday 10:00am-12:00, 8:00pm-9:00pm, and Friday 10:00am-12:00.  Agency Name: [employer] Address: [address], [place], [place] [address] Phone: [phone] Website: [email or web address] Service(s) Offered: Serves weekday meals, open from 11:30 am- 1:00 pm., and 6:30-7:30pm, Monday-Wednesday-Friday distributes food 3:30-6pm, Monday-Wednesday-Friday.  Agency Name: [facility] Address: [address], [place], [place] Phone: [phone] Website: [email or web address] Services Offered: Distributes food the 4th Saturday of the month, starting at 8:00 am  Agency Name: [facility] Address: [phone] [address], [place], [place] [address] Phone: [phone] Website: [email or web address] Service(s) Offered: Bread of life, weekly food pantry. Open Wednesdays from 10:00am-noon.  Agency Name: [facility] [employer] Address: [address] [place], [place], [place] Phone: [phone] Services Offered: Distributes food 9am-1pm, Monday-Thursday. Call for details.  Agency Name: [facility] Address: [address], [place], [place] [address] Phone: [phone] Website:  [email or web address] Service(s) Offered: Games developer. Call for assistance.  Agency Name: [facility] Address: [address], [place], [place] [address] Phone: [phone] Service Offered: Emergency Food Pantry. Call for appointment.  Agency Name: [facility] Address: [address]  [place], [place] [address] Phone: [phone] Website: [email or web address] Services Offered: Games developer. Call for details  Agency Name: [facility] Address: [address] [place], [place] Phone: [phone] Website: [email or web address] Service(s) Offered: Emergency Food Pantry. Call for details.  Agency Name: Holiday representative Address: [address], [place], [place] [address] Phone: [phone] or [phone] Website: [email or web address] Service(s) Offered: Distribute food 9am-11:30 am, Tuesday-Friday, and 1-3:30pm, Monday-Friday. Food pantry Monday-Friday 1pm-3pm, fresh items, Mon.-Wed.-Fri.  Agency Name: [facility] Address: [address] [place], [place] [address] Phone: [phone] Website: [email or web address] Services Offered: Distribute food Tues and Sats from 9:00am-noon. Closed 1st Saturday of each month. Call for details  Agency Name: [employer] Address: [address]do [address] [facility] [address] [place] [address] Phone: [phone]  Services Offered: Delivers meals every Thursday    Transportation Resources  Agency Name: [facility] Agency Address: [address] [place], [place] [address] Phone: [phone] Email: [email or web address] Website: [email or web address] Service(s) Offered: Housing services, self-sufficiency, congregate meal program, weatherization program, Field seismologist program, emergency food assistance,  housing counseling, home ownership program, wheels-towork program.  Agency Name:  [facility] [employer] [phone]) Address: [address] [place], [place] [address] Phone: [phone] Website: [email or web address] Service(s) Offered: Transportation for [employer], subscription and demand response; Dial-a-Ride for citizens 23 years of age or older.  Agency Name: Department of Social Services Address: [address] [place], [place] [address] Phone: [phone] Service(s) Offered: Child support services; child welfare services; food stamps; Medicaid; work first family assistance; and aid with fuel,  rent, food and medicine, transportation assistance.  Agency Name: Disabled [employer] (DAV) Transportation  Network Phone: [phone] Service(s) Offered: Transports veterans to the [facility] medical center. Call  forty-eight hours in advance and leave the name, telephone  number, date, and time of appointment. Veteran will be  contacted by the driver the day before the appointment to  arrange a pick up point   Transportation Resources  Agency Name: [facility] Agency Address: [address] [place], [place] [address] Phone: [phone] Email: [email or web address] Website: [email or web address] Service(s) Offered: Housing services, self-sufficiency, congregate meal program, weatherization program, Field seismologist program, emergency food assistance,  housing counseling, home ownership program, wheels-towork program.  Agency Name: [facility] [employer] [phone]) Address: [address] [place], [place] [address] Phone: [phone] Website: [email or web address] Service(s) Offered: Transportation for [employer], subscription and demand response; Dial-a-Ride for citizens 66 years of age or older.  Agency Name: Department of Social Services Address: [address] [place], [place] [address] Phone: [phone] Service(s) Offered: Child support services; child  welfare services; food stamps; Medicaid; work first family assistance; and aid with fuel,  rent, food and medicine, transportation assistance.  Agency Name: Disabled [employer] (DAV) Transportation  Network Phone: [phone] Service(s) Offered: Transports veterans to the [facility] medical center. Call  forty-eight hours in advance and leave the name, telephone  number, date, and time of appointment. Veteran will be  contacted by the driver the day before the appointment to  arrange a pick up point    [employer] ACTA currently provides door to door services. ACTA connects with PART daily for services to [facility]. ACTA also performs contract services to [employer] operates 27 vehicles, all but 3 mini-vans are equipped with lifts for special needs as well as the general public. ACTA drivers are each CDL certified and trained in First Aid and CPR. ACTA was established in 2002 by  [place] Building services engineer. An independent Industrial/product designer. ACTA operates via Cytogeneticist with required Research scientist (physical sciences) from [place]. ACTA provides over 80,000 passenger trips each year, including [facility] and [employer] sites.  Call at least by 11 AM one business day prior to needing transportation  [employer].                      [place], [place] [address]     Office Hours: Monday-Friday  8 AM - 5 PM     Agency Name: [facility] Agency Address: [address] [place], [place] [address] Phone: [phone] Website: [email or web address] Service(s) Offered: Housing services, self-sufficiency, congregate meal program, and individual development account program.  Agency Name: [employer] Address: [address] [place], [place] [address] Phone: [phone] Email: [email or web address] Website:  www.[email or web address] Service(s) Offered: Housing the homeless, feeding the hungry, Company secretary, job and education related services.  Agency Name: [facility] Address: [address], [place], [place] [address] Phone: [phone] Email: [email or web address] Service(s) Offered: Counseling, problem pregnancy, advocacy for Hispanics, limited emergency financial assistance.  Agency Name: Department of Social Services Address: [address] [place], [place] [address] Phone: [phone] Website: [email or web address] Service(s) Offered: Child support services; child welfare services; SNAP; Medicaid; work first family assistance; and aid with fuel,  rent, food and medicine.  Agency Name: Holiday representative Address: [address] [place], [place] [address] Phone: [phone] or [phone] Email: [email or web address] Service(s) Offered: Family services and transient assistance; emergency food, fuel, clothing, limited furniture, utilities; budget counseling, general counseling; give a kid a coat; thrift store; Christmas food and toys. Utility assistance, food pantry, rental  assistance, life sustaining medicine

## 2022-12-21 NOTE — Interval H&P Note (Signed)
History and Physical Interval Note:  12/21/2022 8:23 AM  Anthony Hess  has presented today for surgery, with the diagnosis of RLE Angio   ASO w rest pain.  The various methods of treatment have been discussed with the patient and family. After consideration of risks, benefits and other options for treatment, the patient has consented to  Procedure(s): Lower Extremity Angiography (Right) as a surgical intervention.  The patient's history has been reviewed, patient examined, no change in status, stable for surgery.  I have reviewed the patient's chart and labs.  Questions were answered to the patient's satisfaction.     [name]

## 2022-12-21 NOTE — Progress Notes (Signed)
Per nursing:  BP 80s/60s- asymptomatic  HR 50s  EKG pending  Will order midodrine  IV albumin x 1 Will otherwise monitor

## 2022-12-21 NOTE — Assessment & Plan Note (Signed)
ESRD on hemodialysis Tuesday Thursday Saturday Per patient, had extra dose of dialysis yesterday prior to pending angiogram today K5.4 today Repeat labs pending to correlate Angiogram deferred for now Dr. Wynelle Link with nephrology consulted for potential hemodialysis today Follow-up recommendations

## 2022-12-21 NOTE — Progress Notes (Signed)
[facility]  ROUNDING NOTE   Subjective:   Anthony Hess is a 67 year old male with past medical conditions including hypertension, PVD, dyslipidemia, CAD, four-vessel CABG, and end-stage renal disease on hemodialysis. Patient presents for outpatient angiogram with vascular surgery and was found to have abnormal labs. He is bring admitted for Limb ischemia [I99.8]  Patient is known to our practice and receives outpatient dialysis at [facility] on a TTS schedule, followed by Dr Cherylann Ratel.  Patient  is seen resting in bed, alert and oriented. States he feels well. Denies pain or discomfort.   Labs indicate potassium 5.5, sodium 134, BUN 62, creatinine 8.31 and GFR 7, calcium 6.5 and Hgb 12.2.   We have been consulted to manage dialysis needs.    Objective:  Vital signs in last 24 hours:  Temp:  [97.9 F (36.6 C)] 97.9 F (36.6 C) (10/29 0830) Pulse Rate:  [55-62] 55 (10/29 1156) Resp:  [16] 16 (10/29 1127) BP: (84-104)/(64-79) 87/66 (10/29 1156) SpO2:  [88 %-93 %] 88 % (10/29 1127) Weight:  [53.3 kg] 53.3 kg (10/29 0830)  Weight change:  Filed Weights   12/21/22 0830  Weight: 53.3 kg    Intake/Output: No intake/output data recorded.   Intake/Output this shift:  No intake/output data recorded.  Physical Exam: General: NAD  Head: Normocephalic, atraumatic. Moist oral mucosal membranes  Eyes: Anicteric  Neck: Supple  Lungs:  Clear to auscultation, normal effort, room air  Heart: Regular rate and rhythm  Abdomen:  Soft, nontender  Extremities:  No peripheral edema.  Neurologic: Alert and oriented, moving all four extremities  Skin: No lesions  Access: Rt permcath    Basic Metabolic Panel: Recent Labs  Lab 12/21/22 1040  NA 134*  K 5.5*  CL 95*  CO2 20*  GLUCOSE 80  BUN 62*  CREATININE 8.31*  CALCIUM 6.5*    Liver Function Tests: Recent Labs  Lab 12/21/22 1040  AST 58*  ALT 20  ALKPHOS 68  BILITOT 0.7  PROT 6.7  ALBUMIN 3.2*   No results  for input(s): "LIPASE", "AMYLASE" in the last 168 hours. No results for input(s): "AMMONIA" in the last 168 hours.  CBC: Recent Labs  Lab 12/21/22 1040  WBC 9.8  NEUTROABS 7.1  HGB 12.2*  HCT 37.7*  MCV 90.8  PLT 292    Cardiac Enzymes: No results for input(s): "CKTOTAL", "CKMB", "CKMBINDEX", "TROPONINI" in the last 168 hours.  BNP: Invalid input(s): "POCBNP"  CBG: No results for input(s): "GLUCAP" in the last 168 hours.  Microbiology: Results for orders placed or performed during the hospital encounter of 10/14/22  SARS Coronavirus 2 by RT PCR (hospital order, performed in [facility] hospital lab) *cepheid single result test* Anterior Nasal Swab     Status: Abnormal   Collection Time: 10/14/22 11:12 PM   Specimen: Anterior Nasal Swab  Result Value Ref Range Status   SARS Coronavirus 2 by RT PCR POSITIVE (A) NEGATIVE Final    Comment: (NOTE) SARS-CoV-2 target nucleic acids are DETECTED  SARS-CoV-2 RNA is generally detectable in upper respiratory specimens  during the acute phase of infection.  Positive results are indicative  of the presence of the identified virus, but do not rule out bacterial infection or co-infection with other pathogens not detected by the test.  Clinical correlation with patient history and  other diagnostic information is necessary to determine patient infection status.  The expected result is negative.  Fact Sheet for Patients:   RoadLapTop.co.za   Fact Sheet  for Healthcare Providers:   http://kim-miller.com/    This test is not yet approved or cleared by the [place] and  has been authorized for detection and/or diagnosis of SARS-CoV-2 by FDA under an Emergency Use Authorization (EUA).  This EUA will remain in effect (meaning this test can be used) for the duration of  the COVID-19 declaration under Section 564(b)(1)  of the Act, 21 U.S.C. section 360-bbb-3(b)(1), unless the  authorization is terminated or revoked sooner.   Performed at [facility], [address] [place], [place] [address]     Coagulation Studies: Recent Labs    12/21/22 1040  LABPROT 15.9*  INR 1.3*    Urinalysis: No results for input(s): "COLORURINE", "LABSPEC", "PHURINE", "GLUCOSEU", "HGBUR", "BILIRUBINUR", "KETONESUR", "PROTEINUR", "UROBILINOGEN", "NITRITE", "LEUKOCYTESUR" in the last 72 hours.  Invalid input(s): "APPERANCEUR"    Imaging: No results found.   Medications:    albumin human     anticoagulant sodium citrate     heparin 900 Units/hr (12/21/22 1158)    aspirin EC  81 mg Oral Daily   calcium acetate  1,334 mg Oral TID WC   Chlorhexidine Gluconate Cloth  6 each Topical Q0600   midodrine  10 mg Oral TID WC   sevelamer carbonate  1,600 mg Oral TID WC   sodium chloride flush  10 mL Intravenous Q12H   alteplase, anticoagulant sodium citrate, feeding supplement (NEPRO CARB STEADY), heparin, HYDROmorphone (DILAUDID) injection, lidocaine (PF), lidocaine-prilocaine, ondansetron **OR** ondansetron (ZOFRAN) IV, oxyCODONE-acetaminophen, pentafluoroprop-tetrafluoroeth  Assessment/ Plan:  Anthony Hess is a 67 y.o.  male with past medical conditions including hypertension, PVD, dyslipidemia, CAD, four-vessel CABG, and end-stage renal disease on hemodialysis. Patient presents for outpatient angiogram with vascular surgery and was found to have abnormal labs. He is bring admitted for Limb ischemia [I99.8]   End stage renal disease with hyperkalemia on hemodialysis. Last treatment received on Monday. Potassium 5.4 today. Will dialyze patient on 2K bath. Next treatment scheduled for Thursday, per outpatient schedule.   2. Anemia of chronic kidney disease Lab Results  Component Value Date   HGB 12.2 (L) 12/21/2022    Hgb within optimal range.   3. Secondary Hyperparathyroidism: with outpatient labs: None available  Lab Results  Component Value Date    PTH 170 (H) 06/25/2022   CALCIUM 6.5 (L) 12/21/2022   CAION 0.82 (LL) 10/13/2022   PHOS 9.4 (H) 11/17/2022    Hypocalcemia noted, corrected calcium 7.1. Will continue to monitor bone minerals during this admission. Will receive some calcium supplementation with dialysis. Sevelamer ordered with meals outpatient.   4. Hypotension with history of hypertension with chronic kidney disease. Home regimen includes amlodipine and isosorbide. Both held. Receiving Midodrine and albumin for BP support.    LOS: 0 Anetria Harwick 10/29/202412:13 PM

## 2022-12-21 NOTE — Progress Notes (Signed)
Pt arrived from vascular lab. VS noted to be hypotensive. Notified Dr Alvester Morin via epic chat. Notified CN and performed EKG per order. During EKG Dr Wynelle Link at bedside to assess patient. Pt needing HD to adjust potassium levels at this time. CN at bedside to assess patient. Pt continuing to have hypotension. Bed request placed to [facility] and bed assigned to ICU6. Heparin gtt started and pharmacy notified. Report called to [employer]. Questions asked and answered. Pt transferred with [facility].

## 2022-12-21 NOTE — Assessment & Plan Note (Signed)
1 pack/day smoker Discussed cessation at length Nicotine patch 

## 2022-12-21 NOTE — Consult Note (Signed)
PHARMACY - ANTICOAGULATION CONSULT NOTE  Pharmacy Consult for Heparin Continuous Infusion  Indication:  limb ischemia  No Known Allergies  Patient Measurements: Height: 5\' 3"  (160 cm) Weight: 51.5 kg (113 lb 8.6 oz) IBW/kg (Calculated) : 56.9 Heparin Dosing Weight: 53.3 kg  Vital Signs: Temp: 98.2 F (36.8 C) (10/29 1445) Temp Source: Oral (10/29 1445) BP: 90/49 (10/29 1915) Pulse Rate: 74 (10/29 1915)  Labs: Recent Labs    12/21/22 1040 12/21/22 1917  HGB 12.2*  --   HCT 37.7*  --   PLT 292  --   APTT 36  --   LABPROT 15.9*  --   INR 1.3*  --   HEPARINUNFRC <0.10* 0.40  CREATININE 8.31*  --     Estimated Creatinine Clearance: 6.5 mL/min (A) (by C-G formula based on SCr of 8.31 mg/dL (H)).   Medical History: Past Medical History:  Diagnosis Date   Aortic atherosclerosis (HCC)    Bilateral carotid artery disease (HCC)    Bladder cancer (HCC)    Coronary artery disease 12/20/2018   a.) LHC 12/20/2018: 50% OM1, 40% OM2, 95% o-pLAD, 75% o=pLCx, 40% mLM, 70% D1, 60% mRCA-1, 50% mRCA-2; refer to CVTS. b.) 4v CABG at [facility] on 12/27/2018: LIMA-LAD, RIMA-PDA, seg LRA-OM1-D1   DCM (dilated cardiomyopathy) (HCC) 12/05/2018   a.) TTE 12/05/2018: EF 40-45%. b.) TTE 12/28/2019: EF 20-25%.   Dyspnea 10/15/2022   ESRD (end stage renal disease) (HCC)    a.) T-Th-Sat   HFrEF (heart failure with reduced ejection fraction) (HCC) 12/05/2018   a.) TTE 12/05/2018: EF 40-45%; mild LVH; ant/apical/sep HK; mild TR . b.) TTE 12/28/2019: EF 20-25%; mod LVH; mod MR/AR; G1DD.   History of 2019 novel coronavirus disease (COVID-19) 04/08/2021   History of kidney stones    HLD (hyperlipidemia)    Hx of CABG 12/27/2018   Hypertension    Infrarenal abdominal aortic aneurysm (AAA) without rupture (HCC) 03/05/2021   a.) CT abd/pelvis; measured 3.2 cm.   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 Last dose: some time last week(has been not taking d/t planned RLE angio)  Assessment: 67 year old patient who had planned RLE angiography with vascular today. However, patient was found to be hyperkalemic prior to the procedure(K: 5.4). Per vascular lab, procedure will not be done today, patient will be admitted until hyperkalemia is resolved. Was taking apixaban PTA but held dose for multiple days in anticipation of procedure.  Baseline labs: aPTT 36 sec, HL<0.10, INR 1.3, Plts 292, Hgb 12.2  Goal of Therapy:  Heparin level 0.3-0.7 units/ml Heparin level 66-102 units/ml Monitor platelets by anticoagulation protocol: Yes    10/29 1917 HL 0.4, therapeutic x 1  Plan:  Anti-Xa level therapeutic x 1 Continue heparin infusion at 900 units/hr Check anti-Xa level in 8 hours to confirm Continue to monitor H&H and platelets  Barrie Folk, PharmD 12/21/2022,7:55 PM

## 2022-12-21 NOTE — H&P (Signed)
History and Physical    Patient: Anthony Hess OZD:[id number] DOB: 1955-10-30 DOA: 12/21/2022 DOS: the patient was seen and examined on 12/21/2022 PCP: Ronnald Ramp, MD  Patient coming from: Home  Chief Complaint: No chief complaint on file.  HPI: Anthony Hess is a 67 y.o. male with medical history significant of ESRD on HD TTS, CAD, status post CABG, paroxysmal atrial fibrillation, history of pulmonary embolism, tobacco use disorder, PVD, hyperlipidemia, long history of medical nonadherence presenting with limb ischemia and hyperkalemia.  Patient noted to have been evaluated by vascular surgery planning angiogram today.  Has been reporting bilateral limb pain and weakness especially with walking.  Noted baseline peripheral vascular disease.  Vascular surgery and plan for angiogram to assess for any current peripheral vascular occlusion.  Had lab draw today which showed a potassium of 5.4.  Patient reports getting hemodialysis yesterday prior to procedure.  Has been otherwise compliant with medication regimen per patient.  No fevers or chills.  No nausea or vomiting.  Still smoking roughly 1 pack/day.  No reported alcohol use.  Limb pain predominate over the right side. No reported trauma.  Presenting in the vascular recovery area afebrile, hemodynamically stable.  Potassium of 5.4.  Labs pending. Review of Systems: As mentioned in the history of present illness. All other systems reviewed and are negative. Past Medical History:  Diagnosis Date   Aortic atherosclerosis (HCC)    Bilateral carotid artery disease (HCC)    Bladder cancer (HCC)    Coronary artery disease 12/20/2018   a.) LHC 12/20/2018: 50% OM1, 40% OM2, 95% o-pLAD, 75% o=pLCx, 40% mLM, 70% D1, 60% mRCA-1, 50% mRCA-2; refer to CVTS. b.) 4v CABG at [facility] on 12/27/2018: LIMA-LAD, RIMA-PDA, seg LRA-OM1-D1   DCM (dilated cardiomyopathy) (HCC) 12/05/2018   a.) TTE 12/05/2018: EF 40-45%. b.) TTE 12/28/2019: EF 20-25%.    Dyspnea 10/15/2022   ESRD (end stage renal disease) (HCC)    a.) T-Th-Sat   HFrEF (heart failure with reduced ejection fraction) (HCC) 12/05/2018   a.) TTE 12/05/2018: EF 40-45%; mild LVH; ant/apical/sep HK; mild TR . b.) TTE 12/28/2019: EF 20-25%; mod LVH; mod MR/AR; G1DD.   History of 2019 novel coronavirus disease (COVID-19) 04/08/2021   History of kidney stones    HLD (hyperlipidemia)    Hx of CABG 12/27/2018   Hypertension    Infrarenal abdominal aortic aneurysm (AAA) without rupture (HCC) 03/05/2021   a.) CT abd/pelvis; measured 3.2 cm.   Melena 05/04/2022   Myocardial infarction [facility])    NSTEMI (non-ST elevated myocardial infarction) (HCC) 04/19/2022   Perforation bowel (HCC) 04/26/2022   PVD (peripheral vascular disease) (HCC)    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   a.) LIMA-LAD, RIMA-PDA, sequential LEFT radial artery to OM1 and D1   Sepsis (HCC) 03/14/2021   Wears glasses    Past Surgical History:  Procedure Laterality Date   AV FISTULA PLACEMENT Left 07/30/2021   Procedure: INSERTION OF ARTERIOVENOUS (AV) GORE-TEX GRAFT ARM BRACHIAL ARTERY TO AXILLARY VEIN;  Surgeon: Annice Needy, MD;  Location: [facility] ORS;  Service: Vascular;  Laterality: Left;   CAPD INSERTION N/A 12/31/2019   Procedure: LAPAROSCOPIC INSERTION CONTINUOUS AMBULATORY PERITONEAL DIALYSIS  (CAPD) CATHETER;  Surgeon: Leafy Ro, MD;  Location: [facility] ORS;  Service: General;  Laterality: N/A;   CAPD REMOVAL N/A 04/10/2020   Procedure: LAPAROSCOPIC REVISION OF CONTINUOUS AMBULATORY PERITONEAL DIALYSIS  (CAPD) CATHETER;  Surgeon: Leafy Ro, MD;  Location: [facility] ORS;  Service: General;  Laterality: N/A;   CORONARY  ARTERY BYPASS GRAFT N/A 12/27/2018   Procedure: CORONARY ARTERY BYPASS GRAFTING (CABG) X 4 ON PUMP USING RIGHT & LEFT INTERNAL MAMMARY ARTERY LEFT RADIAL ARTERY ENDOSCOPICALLY HARVESTED;  Surgeon: Linden Dolin, MD;  Location: [facility];  Service: Open Heart Surgery;  Laterality: N/A;   CYSTOSCOPY W/ RETROGRADES  Bilateral 05/15/2019   Procedure: CYSTOSCOPY WITH RETROGRADE PYELOGRAM;  Surgeon: Riki Altes, MD;  Location: [facility] ORS;  Service: Urology;  Laterality: Bilateral;   CYSTOSCOPY WITH BIOPSY N/A 05/15/2019   Procedure: CYSTOSCOPY WITH bladder BIOPSY;  Surgeon: Riki Altes, MD;  Location: [facility] ORS;  Service: Urology;  Laterality: N/A;   DIALYSIS/PERMA CATHETER INSERTION N/A 12/28/2019   Procedure: DIALYSIS/PERMA CATHETER INSERTION;  Surgeon: Annice Needy, MD;  Location: [facility] IN[facility]AS[facility] LAB;  Service: Cardiovascular;  Laterality: N/A;   DIALYSIS/PERMA CATHETER INSERTION N/A 03/18/2021   Procedure: DIALYSIS/PERMA CATHETER INSERTION;  Surgeon: Annice Needy, MD;  Location: [facility];  Service: Cardiovascular;  Laterality: N/A;   DIALYSIS/PERMA CATHETER REMOVAL N/A 06/02/2020   Procedure: DIALYSIS/PERMA CATHETER REMOVAL;  Surgeon: Annice Needy, MD;  Location: [facility];  Service: Cardiovascular;  Laterality: N/A;   DIALYSIS/PERMA CATHETER REPAIR N/A 10/22/2022   Procedure: DIALYSIS/PERMA CATHETER REPAIR;  Surgeon: Renford Dills, MD;  Location: [facility];  Service: Cardiovascular;  Laterality: N/A;   EXCHANGE OF A DIALYSIS CATHETER Right 04/10/2020   Procedure: EXCHANGE OF A DIALYSIS CATHETER;  Surgeon: Leafy Ro, MD;  Location: [facility];  Service: General;  Laterality: Right;   INCISIONAL HERNIA REPAIR  01/20/2021   Procedure: HERNIA REPAIR INCISIONAL;  Surgeon: Henrene Dodge, MD;  Location: [facility] ORS;  Service: General;;   IR IMAGE GUIDED DRAINAGE PERCUT CATH  PERITONEAL RETROPERIT  04/07/2020   LAPAROSCOPY N/A 04/16/2021   Procedure: LAPAROSCOPY DIAGNOSTIC;  Surgeon: Sung Amabile, DO;  Location: [facility] OR[facility];  Service: General;  Laterality: N/A;   LAPAROTOMY N/A 04/26/2022   Procedure: EXPLORATORY LAPAROTOMY WITH REPAIR OF DUODENAL ULCER;  Surgeon: Carolan Shiver, MD;  Location: [facility] OR[facility];  Service: General;  Laterality: N/A;   LEFT HEART CATH AND CORONARY  ANGIOGRAPHY Left 12/20/2018   Procedure: LEFT HEART CATH AND CORONARY ANGIOGRAPHY;  Surgeon: Marcina Millard, MD;  Location: [facility];  Service: Cardiovascular;  Laterality: Left;   RADIAL ARTERY HARVEST Left 12/27/2018   Procedure: ENDOSCOPIC RADIAL ARTERY HARVEST;  Surgeon: Linden Dolin, MD;  Location: [facility];  Service: Open Heart Surgery;  Laterality: Left;   REMOVAL OF A DIALYSIS CATHETER Left 03/20/2021   Procedure: REMOVAL OF A PD CATHETER;  Surgeon: Annice Needy, MD;  Location: [facility] OR[facility];  Service: Vascular;  Laterality: Left;   REVISION OF ARTERIOVENOUS GORETEX GRAFT Left 09/11/2021   Procedure: Excisionof infected AV graft;  Surgeon: Renford Dills, MD;  Location: [facility] ORS;  Service: Vascular;  Laterality: Left;   TEE WITHOUT CARDIOVERSION N/A 12/27/2018   Procedure: TRANSESOPHAGEAL ECHOCARDIOGRAM (TEE);  Surgeon: Linden Dolin, MD;  Location: [facility] OR;  Service: Open Heart Surgery;  Laterality: N/A;   TUMOR REMOVAL  2019   Bladder   Social History:  reports that he has been smoking cigarettes. He has never used smokeless tobacco. He reports that he does not drink alcohol and does not use drugs.  No Known Allergies  Family History  Problem Relation Age of Onset   Heart failure Mother     Prior to Admission medications   Medication Sig Start Date End Date Taking? Authorizing Provider  amLODipine (NORVASC) 2.5 MG tablet Take 1 tablet (2.5 mg total) by mouth daily. 05/07/22  Yes Wieting, Richard, MD  apixaban (ELIQUIS) 5 MG TABS tablet Take 1 tablet (5 mg total) by mouth 2 (two) times daily. 05/01/22  Yes Baron Hamper, MD  aspirin EC 81 MG tablet Take 81 mg by mouth daily. 05/06/20  Yes [provider]  calcium acetate (PHOSLO) 667 MG capsule Take 1 capsule (667 mg total) by mouth 3 (three) times daily with meals. 11/18/21  Yes Sunnie Nielsen, DO  isosorbide dinitrate (ISORDIL) 10 MG tablet Take 1 tablet (10 mg total) by mouth 3 (three) times daily.  10/15/22  Yes Arnetha Courser, MD  oxyCODONE-acetaminophen (PERCOCET/ROXICET) 5-325 MG tablet Take 1 tablet by mouth every 4 (four) hours as needed for severe pain (pain score 7-10). 12/15/22  Yes Georgiana Spinner, NP  sevelamer carbonate (RENVELA) 800 MG tablet Take 1,600 mg by mouth 3 (three) times daily. 11/16/22  Yes [provider]  pantoprazole (PROTONIX) 40 MG tablet Take 1 tablet (40 mg total) by mouth daily. Patient not taking: Reported on 12/14/2022 05/07/22 06/06/22  Alford Highland, MD    Physical Exam: Vitals:   12/21/22 0830  BP: 104/70  Pulse: 62  Resp: 16  Temp: 97.9 F (36.6 C)  TempSrc: Oral  SpO2: 93%  Weight: 53.3 kg  Height: 5\' 3"  (1.6 m)   Physical Exam Constitutional:      Comments: Underweight    HENT:     Head: Normocephalic and atraumatic.     Nose: Nose normal.  Eyes:     Pupils: Pupils are equal, round, and reactive to light.  Cardiovascular:     Rate and Rhythm: Normal rate and regular rhythm.  Pulmonary:     Effort: Pulmonary effort is normal.  Abdominal:     General: Bowel sounds are normal.  Musculoskeletal:     Comments: Bilateral lower extremities cool to touch  R foot mottled   Skin:    General: Skin is dry.  Neurological:     General: No focal deficit present.  Psychiatric:        Mood and Affect: Mood normal.     Data Reviewed:  There are no new results to review at this time.   Assessment and Plan: * Limb ischemia Worsening bilateral limb pain and weakness with ambulation Exam concerning for limb ischemia Noted cool and mottled distal right extremity With pending angiogram with Dr. Milford Cage in the setting of mild hyperkalemia Will start a heparin drip in the interim Follow-up vascular surgery recommendations  (HFpEF) heart failure with preserved ejection fraction (HCC) 2D echo July 2024 EF of 60 to 65% and grade 1 diastolic dysfunction Dialysis dependent volume status Monitor  Hyperkalemia K5.4 today  in the setting of baseline ESRD on hemodialysis Tuesday Thursday Saturday Patient reports getting dialysis yesterday with still persistent hyperkalemia Will consult nephrology for further evaluation Deferring angiogram for now per vascular surgery recommendations Follow-up nephrology input  Essential hypertension BP stable  Titrate home regimen    End-stage renal disease on hemodialysis [facility]) ESRD on hemodialysis Tuesday Thursday Saturday Per patient, had extra dose of dialysis yesterday prior to pending angiogram today K5.4 today Repeat labs pending to correlate Angiogram deferred for now Dr. Wynelle Link with nephrology consulted for potential hemodialysis today Follow-up recommendations  Tobacco dependence 1 pack/day smoker Discussed cessation at length Nicotine patch  Atrial fibrillation, chronic (HCC) EKG pending  Rate controlled at present  On eliquis  Transitioned to heparin gtt  in setting of limb ischemia  Follow    CAD (coronary artery disease) CAD s/p CABG  No active CP  Cont asa      Advance Care Planning:   Code Status: Full Code   Consults: Nephrology, Vascular surgery   Family Communication: No family at the bedside   Severity of Illness: The appropriate patient status for this patient is INPATIENT. Inpatient status is judged to be reasonable and necessary in order to provide the required intensity of service to ensure the patient's safety. The patient's presenting symptoms, physical exam findings, and initial radiographic and laboratory data in the context of their chronic comorbidities is felt to place them at high risk for further clinical deterioration. Furthermore, it is not anticipated that the patient will be medically stable for discharge from the hospital within 2 midnights of admission.   * I certify that at the point of admission it is my clinical judgment that the patient will require inpatient hospital care spanning beyond 2 midnights from the  point of admission due to high intensity of service, high risk for further deterioration and high frequency of surveillance required.*  Author: Floydene Flock, MD 12/21/2022 10:26 AM  For on call review [email or web address].

## 2022-12-21 NOTE — Consult Note (Addendum)
PHARMACY - ANTICOAGULATION CONSULT NOTE  Pharmacy Consult for Heparin Continuous Infusion  Indication:  limb ischemia  No Known Allergies  Patient Measurements: Height: 5\' 3"  (160 cm) Weight: 53.3 kg (117 lb 9.6 oz) IBW/kg (Calculated) : 56.9 Heparin Dosing Weight: 53.3 kg  Vital Signs: Temp: 97.9 F (36.6 C) (10/29 0830) Temp Source: Oral (10/29 0830) BP: 104/70 (10/29 0830) Pulse Rate: 62 (10/29 0830)  Labs: No results for input(s): "HGB", "HCT", "PLT", "APTT", "LABPROT", "INR", "HEPARINUNFRC", "HEPRLOWMOCWT", "CREATININE", "CKTOTAL", "CKMB", "TROPONINIHS" in the last 72 hours.  CrCl cannot be calculated (Patient's most recent lab result is older than the maximum 21 days allowed.).   Medical History: Past Medical History:  Diagnosis Date   Aortic atherosclerosis (HCC)    Bilateral carotid artery disease (HCC)    Bladder cancer (HCC)    Coronary artery disease 12/20/2018   a.) LHC 12/20/2018: 50% OM1, 40% OM2, 95% o-pLAD, 75% o=pLCx, 40% mLM, 70% D1, 60% mRCA-1, 50% mRCA-2; refer to CVTS. b.) [facility]v CABG at [facility] on 12/27/2018: LIMA-LAD, RIMA-PDA, seg LRA-OM1-D1   DCM (dilated cardiomyopathy) (HCC) 12/05/2018   a.) TTE 12/05/2018: EF 40-45%. b.) TTE 12/28/2019: EF 20-25%.   Dyspnea 10/15/2022   ESRD (end stage renal disease) (HCC)    a.) T-Th-Sat   HFrEF (heart failure with reduced ejection fraction) (HCC) 12/05/2018   a.) TTE 12/05/2018: EF 40-45%; mild LVH; ant/apical/sep HK; mild TR . b.) TTE 12/28/2019: EF 20-25%; mod LVH; mod MR/AR; G1DD.   History of 2019 novel coronavirus disease (COVID-19) 04/08/2021   History of kidney stones    HLD (hyperlipidemia)    Hx of CABG 12/27/2018   Hypertension    Infrarenal abdominal aortic aneurysm (AAA) without rupture (HCC) 03/05/2021   a.) CT abd/pelvis; measured 3.2 cm.   Melena 05/04/2022   Myocardial infarction [facility])    NSTEMI (non-ST elevated myocardial infarction) (HCC) 04/19/2022   Perforation bowel (HCC) 04/26/2022   PVD  (peripheral vascular disease) (HCC)    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   a.) LIMA-LAD, RIMA-PDA, sequential LEFT radial artery to OM1 and D1   Sepsis (HCC) 03/14/2021   Wears glasses     Pertinent Medications:  PTA Apixaban 5mg  BID. Last dose: some time last week(has been not taking d/t planned RLE angio)  Assessment: 66 year old patient who had planned RLE angiography with vascular today. However, patient was found to be hyperkalemic prior to the procedure(K: 5.4). Per vascular lab, procedure will not be done today, patient will be admitted until hyperkalemia is resolved. Was taking apixaban PTA but held dose for multiple days in anticipation of procedure.  Baseline labs: aPTT 36 sec, HL<0.10, INR 1.3, Plts 292, Hgb 12.2  Goal of Therapy:  Heparin level 0.3-0.7 units/ml Heparin level 66-102 units/ml Monitor platelets by anticoagulation protocol: Yes   Plan:  Give 3200 units bolus x 1 Start heparin infusion at 900 units/hr Check anti-Xa level in 8 hours(since baseline level was < 0.10) and daily while on heparin Continue to monitor H&H and platelets  Anthony Hess A Conn Trombetta 12/21/2022,10:35 AM

## 2022-12-21 NOTE — Assessment & Plan Note (Signed)
K5.4 today in the setting of baseline ESRD on hemodialysis Tuesday Thursday Saturday Patient reports getting dialysis yesterday with still persistent hyperkalemia Will consult nephrology for further evaluation Deferring angiogram for now per vascular surgery recommendations Follow-up nephrology input

## 2022-12-21 NOTE — Assessment & Plan Note (Addendum)
2D echo July 2024 EF of 55-60% and grade 1 diastolic dysfunction Dialysis dependent volume status Monitor

## 2022-12-21 NOTE — Assessment & Plan Note (Signed)
Worsening bilateral limb pain and weakness with ambulation Exam concerning for limb ischemia Noted cool and mottled distal right extremity With pending angiogram with Dr. Milford Cage in the setting of mild hyperkalemia Will start a heparin drip in the interim Follow-up vascular surgery recommendations

## 2022-12-21 NOTE — Progress Notes (Signed)
Hemodialysis note  Received patient in bed in the ICU. Alert and oriented.  Informed consent signed and in chart.  Treatment initiated: 1501 Treatment completed: 1850  Patient tolerated well. Alert without acute distress.  Report given to patient's RN.   Access used: Right Chest HD PermCath.  Access issues: High arterial pressure. Catheter is positional. Lines were reversed during treatment.  Total UF removed: 1.5L Medication(s) given:  none  Post HD weight: 51.5 kg   Wolfgang Phoenix [facility]

## 2022-12-21 NOTE — Assessment & Plan Note (Signed)
BP stable Titrate home regimen 

## 2022-12-21 NOTE — Assessment & Plan Note (Signed)
CAD s/p CABG  No active CP  Cont asa

## 2022-12-22 DIAGNOSIS — I998 Other disorder of circulatory system: Secondary | ICD-10-CM | POA: Diagnosis not present

## 2022-12-22 LAB — HEPARIN LEVEL (UNFRACTIONATED)
Heparin Unfractionated: 0.16 [IU]/mL — ABNORMAL LOW (ref 0.30–0.70)
Heparin Unfractionated: 0.12 [IU]/mL — ABNORMAL LOW (ref 0.30–0.70)
Heparin Unfractionated: 0.36 [IU]/mL (ref 0.30–0.70)

## 2022-12-22 LAB — COMPREHENSIVE METABOLIC PANEL
ALT: 19 U/L (ref 0–44)
AST: 39 U/L (ref 15–41)
Albumin: 4 g/dL (ref 3.5–5.0)
Alkaline Phosphatase: 62 U/L (ref 38–126)
Anion gap: 14 (ref 5–15)
BUN: 34 mg/dL — ABNORMAL HIGH (ref 8–23)
CO2: 24 mmol/L (ref 22–32)
Calcium: 7.2 mg/dL — ABNORMAL LOW (ref 8.9–10.3)
Chloride: 96 mmol/L — ABNORMAL LOW (ref 98–111)
Creatinine, Ser: 4.89 mg/dL — ABNORMAL HIGH (ref 0.61–1.24)
GFR, Estimated: 13 mL/min — ABNORMAL LOW (ref >60)
Glucose, Bld: 86 mg/dL (ref 70–99)
Potassium: 4.2 mmol/L (ref 3.5–5.1)
Sodium: 134 mmol/L — ABNORMAL LOW (ref 135–145)
Total Bilirubin: 0.8 mg/dL (ref 0.3–1.2)
Total Protein: 7.2 g/dL (ref 6.5–8.1)

## 2022-12-22 LAB — HEPATITIS B SURFACE ANTIBODY, QUANTITATIVE: Hep B S AB Quant (Post): 32.8 m[IU]/mL

## 2022-12-22 LAB — CBC
HCT: 41.2 % (ref 39.0–52.0)
Hemoglobin: 13.1 g/dL (ref 13.0–17.0)
MCH: 29.2 pg (ref 26.0–34.0)
MCHC: 31.8 g/dL (ref 30.0–36.0)
MCV: 91.8 fL (ref 80.0–100.0)
Platelets: 284 10*3/uL (ref 150–400)
RBC: 4.49 MIL/uL (ref 4.22–5.81)
RDW: 17.1 % — ABNORMAL HIGH (ref 11.5–15.5)
WBC: 7.7 10*3/uL (ref 4.0–10.5)
nRBC: 0 % (ref 0.0–0.2)

## 2022-12-22 MED ORDER — HEPARIN BOLUS VIA INFUSION
1600.0000 [IU] | Freq: Once | INTRAVENOUS | Status: AC
  Administered 2022-12-22: 1600 [IU] via INTRAVENOUS
  Filled 2022-12-22: qty 1600

## 2022-12-22 NOTE — Progress Notes (Signed)
Progress Note    12/22/2022 12:44 PM 1 Day Post-Op  Subjective:  Anthony Hess is a 67 yo male who was scheduled for a right lower extremity angiogram as an outpatient yesterday on 12/21/2022.  Patient's case was canceled due to elevated potassium at 5.4 and patient was admitted to the hospital for dialysis and reschedule of right lower extremity angiogram.  Yesterday I had a long conversation with the patient and his wife on the phone about the need to be hospitalized on a heparin infusion due to critical limb loss without further treatment.  Both verbalized her understanding at that time.  This morning I was called to ICU to discuss with the patient the plan going forward.  Patient was dialyzed yesterday afternoon and this morning his potassium is now 4.2 but was placed on midodrine due to low blood pressures.  Patient remains on a heparin infusion and his right lower extremity continues to be dusky and cool to touch.  Unable to palpate or Doppler pulses this morning.  Patient is requesting to go home via discharge this morning.  I do not advise that the patient leave at this time due to critical limb loss.  I recommend that the patient remain on a heparin infusion until we can take the patient to the vascular lab to do a right lower extremity angiogram with possible intervention.  The patient may require a lysis catheter overnight in order to revascularize his lower extremity.  The patient then called his wife who I spoke with on the phone in detail about the plan going forward.  Patient's wife is dissatisfied with her care and is demanding a transfer to [facility] in [place] via vein and vascular surgery there.  I informed her I would reach out to the physician on-call Dr. Chestine Spore and see if there was availability at [facility] vein and vascular in [facility] for procedure today.  Dr. Chestine Spore and I spoke over the phone this morning.  Dr. Chestine Spore is the physician on-call and has a full  schedule tomorrow and Friday.  Dr. Chestine Spore is unable to take the patient via transfer today for any revascularization.  I plan on speaking to the patient and his wife again about the unavailability to transfer and to decide about a future plan.   Vitals:   12/22/22 1000 12/22/22 1157  BP: (!) 111/92 (!) 80/55  Pulse: 86 68  Resp: 17 17  Temp:  98.4 F (36.9 C)  SpO2: 90% 92%   Physical Exam: Cardiac:  RRR, No murmurs Lungs:  Clar on auscultation. No rales, rhonchi or wheezing Incisions:  None Extremities:  Right lower extremity blue and dusky. Cool to touch without palpable or doppler pulses.  Abdomen:  Positive bowel sounds, soft, non tender and non distended.  Neurologic: AAOX3 answers questions appropriately and follows commands.   CBC    Component Value Date/Time   WBC 7.7 12/22/2022 0243   RBC 4.49 12/22/2022 0243   HGB 13.1 12/22/2022 0243   HGB 9.1 (L) 01/04/2020 1540   HCT 41.2 12/22/2022 0243   HCT 29.6 (L) 01/04/2020 1540   PLT 284 12/22/2022 0243   PLT 191 01/04/2020 1540   MCV 91.8 12/22/2022 0243   MCV 94 01/04/2020 1540   MCH 29.2 12/22/2022 0243   MCHC 31.8 12/22/2022 0243   RDW 17.1 (H) 12/22/2022 0243   RDW 16.2 (H) 01/04/2020 1540   LYMPHSABS 0.9 12/21/2022 1040   LYMPHSABS 1.0 01/04/2020 1540   MONOABS 1.3 (H)  12/21/2022 1040   EOSABS 0.4 12/21/2022 1040   EOSABS 0.5 (H) 03/27/2018 1643   BASOSABS 0.1 12/21/2022 1040   BASOSABS 0.0 03/27/2018 1643    BMET    Component Value Date/Time   NA 134 (L) 12/22/2022 0243   NA 141 06/25/2022 1414   K 4.2 12/22/2022 0243   CL 96 (L) 12/22/2022 0243   CO2 24 12/22/2022 0243   GLUCOSE 86 12/22/2022 0243   BUN 34 (H) 12/22/2022 0243   BUN 39 (H) 06/25/2022 1414   CREATININE 4.89 (H) 12/22/2022 0243   CREATININE 4.62 (H) 01/10/2019 1351   CALCIUM 7.2 (L) 12/22/2022 0243   GFRNONAA 13 (L) 12/22/2022 0243   GFRAA 15 (L) 01/04/2020 1542    INR    Component Value Date/Time   INR 1.3 (H) 12/21/2022 1040      Intake/Output Summary (Last 24 hours) at 12/22/2022 1244 Last data filed at 12/21/2022 2119 Gross per 24 hour  Intake 920 ml  Output 0 ml  Net 920 ml     Assessment/Plan:  67 y.o. male is s/p cancelled right lower extremity angiogram due to hyperkalemia 1 Day Post-Op   PLAN: Patient continues to need a right lower extremity angiogram with possible intervention and possible lysis catheter due to right lower extremity ischemia and critical limb loss.  Dr. Chestine Spore from [facility] was contacted about transfer per the patient and patient's wife's request.  At this time they do not have availability to transfer the patient for any procedure.  I will inform both the patient and his wife this afternoon when I go to see the patient again.  Hospitalist Dr. Mayford Knife will be notified as well of the inability to transfer.  DVT prophylaxis: Heparin infusion   Marcie Bal [facility] 12/22/2022 12:44 PM

## 2022-12-22 NOTE — Consult Note (Signed)
PHARMACY - ANTICOAGULATION CONSULT NOTE  Pharmacy Consult for Heparin Continuous Infusion  Indication:  limb ischemia  No Known Allergies  Patient Measurements: Height: 5\' 3"  (160 cm) Weight: 51.5 kg (113 lb 8.6 oz) IBW/kg (Calculated) : 56.9 Heparin Dosing Weight: 53.3 kg  Vital Signs: Temp: 98.4 F (36.9 C) (10/30 1157) Temp Source: Oral (10/30 0739) BP: 80/55 (10/30 1157) Pulse Rate: 68 (10/30 1157)  Labs: Recent Labs    12/21/22 1040 12/21/22 1917 12/22/22 0243 12/22/22 1231  HGB 12.2*  --  13.1  --   HCT 37.7*  --  41.2  --   PLT 292  --  284  --   APTT 36  --   --   --   LABPROT 15.9*  --   --   --   INR 1.3*  --   --   --   HEPARINUNFRC <0.10* 0.40 0.16* 0.12*  CREATININE 8.31*  --  4.89*  --    Estimated Creatinine Clearance: 11.1 mL/min (A) (by C-G formula based on SCr of 4.89 mg/dL (H)).  Medical History: Past Medical History:  Diagnosis Date   Aortic atherosclerosis (HCC)    Bilateral carotid artery disease (HCC)    Bladder cancer (HCC)    Coronary artery disease 12/20/2018   a.) LHC 12/20/2018: 50% OM1, 40% OM2, 95% o-pLAD, 75% o=pLCx, 40% mLM, 70% D1, 60% mRCA-1, 50% mRCA-2; refer to CVTS. b.) 4v CABG at [facility] on 12/27/2018: LIMA-LAD, RIMA-PDA, seg LRA-OM1-D1   DCM (dilated cardiomyopathy) (HCC) 12/05/2018   a.) TTE 12/05/2018: EF 40-45%. b.) TTE 12/28/2019: EF 20-25%.   Dyspnea 10/15/2022   ESRD (end stage renal disease) (HCC)    a.) T-Th-Sat   HFrEF (heart failure with reduced ejection fraction) (HCC) 12/05/2018   a.) TTE 12/05/2018: EF 40-45%; mild LVH; ant/apical/sep HK; mild TR . b.) TTE 12/28/2019: EF 20-25%; mod LVH; mod MR/AR; G1DD.   History of 2019 novel coronavirus disease (COVID-19) 04/08/2021   History of kidney stones    HLD (hyperlipidemia)    Hx of CABG 12/27/2018   Hypertension    Infrarenal abdominal aortic aneurysm (AAA) without rupture (HCC) 03/05/2021   a.) CT abd/pelvis; measured 3.2 cm.   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 Last dose: some time last week (has been not taking d/t planned RLE angio)  Assessment: 67 year old patient who had planned RLE angiography with vascular today. However, patient was found to be hyperkalemic prior to the procedure(K: 5.4). Per vascular lab, procedure will not be done today, patient will be admitted until hyperkalemia is resolved. Was taking apixaban PTA but held dose for multiple days in anticipation of procedure.  Baseline labs: aPTT 36 sec, HL<0.10, INR 1.3, Plts 292, Hgb 12.2  Goal of Therapy:  Heparin level 0.3-0.7 units/ml Heparin level 66-102 units/ml Monitor platelets by anticoagulation protocol: Yes   Labs: 10/29 1917 HL 0.4, therapeutic x 1 10/30 0243 HL 0.16, subtherapeutic  10/30 1231 HL 0.12, subtherapeutic   Plan:  Give another bolus of 1600 units x 1 Increase heparin infusion to 1250 units/hr Recheck HL 8 hrs after rate change Continue to  monitor H&H and platelets  Littie Deeds, PharmD Pharmacy Resident  12/22/2022 1:44 PM

## 2022-12-22 NOTE — Consult Note (Signed)
PHARMACY - ANTICOAGULATION CONSULT NOTE  Pharmacy Consult for Heparin Continuous Infusion  Indication:  limb ischemia  No Known Allergies  Patient Measurements: Height: 5\' 3"  (160 cm) Weight: 51.5 kg (113 lb 8.6 oz) IBW/kg (Calculated) : 56.9 Heparin Dosing Weight: 53.3 kg  Vital Signs: Temp: 98 F (36.7 C) (10/30 0310) Temp Source: Oral (10/30 0310) BP: 108/71 (10/30 0310) Pulse Rate: 63 (10/30 0310)  Labs: Recent Labs    12/21/22 1040 12/21/22 1917 12/22/22 0243  HGB 12.2*  --  13.1  HCT 37.7*  --  41.2  PLT 292  --  284  APTT 36  --   --   LABPROT 15.9*  --   --   INR 1.3*  --   --   HEPARINUNFRC <0.10* 0.40 0.16*  CREATININE 8.31*  --  4.89*    Estimated Creatinine Clearance: 11.1 mL/min (A) (by C-G formula based on SCr of 4.89 mg/dL (H)).   Medical History: Past Medical History:  Diagnosis Date   Aortic atherosclerosis (HCC)    Bilateral carotid artery disease (HCC)    Bladder cancer (HCC)    Coronary artery disease 12/20/2018   a.) LHC 12/20/2018: 50% OM1, 40% OM2, 95% o-pLAD, 75% o=pLCx, 40% mLM, 70% D1, 60% mRCA-1, 50% mRCA-2; refer to CVTS. b.) 4v CABG at [facility] on 12/27/2018: LIMA-LAD, RIMA-PDA, seg LRA-OM1-D1   DCM (dilated cardiomyopathy) (HCC) 12/05/2018   a.) TTE 12/05/2018: EF 40-45%. b.) TTE 12/28/2019: EF 20-25%.   Dyspnea 10/15/2022   ESRD (end stage renal disease) (HCC)    a.) T-Th-Sat   HFrEF (heart failure with reduced ejection fraction) (HCC) 12/05/2018   a.) TTE 12/05/2018: EF 40-45%; mild LVH; ant/apical/sep HK; mild TR . b.) TTE 12/28/2019: EF 20-25%; mod LVH; mod MR/AR; G1DD.   History of 2019 novel coronavirus disease (COVID-19) 04/08/2021   History of kidney stones    HLD (hyperlipidemia)    Hx of CABG 12/27/2018   Hypertension    Infrarenal abdominal aortic aneurysm (AAA) without rupture (HCC) 03/05/2021   a.) CT abd/pelvis; measured 3.2 cm.   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 Last dose: some time last week(has been not taking d/t planned RLE angio)  Assessment: 67 year old patient who had planned RLE angiography with vascular today. However, patient was found to be hyperkalemic prior to the procedure(K: 5.4). Per vascular lab, procedure will not be done today, patient will be admitted until hyperkalemia is resolved. Was taking apixaban PTA but held dose for multiple days in anticipation of procedure.  Baseline labs: aPTT 36 sec, HL<0.10, INR 1.3, Plts 292, Hgb 12.2  Goal of Therapy:  Heparin level 0.3-0.7 units/ml Heparin level 66-102 units/ml Monitor platelets by anticoagulation protocol: Yes    10/29 1917 HL 0.4, therapeutic x 1 10/30 0243 HL 0.16  Plan:  Bolus 1600 units x 1 Increase heparin infusion to 1050 units/hr Recheck HL in 8 hrs after rate change Continue to monitor H&H and platelets  Otelia Sergeant, PharmD, [facility] 12/22/2022 3:45 AM

## 2022-12-22 NOTE — Progress Notes (Signed)
PROGRESS NOTE    Anthony Hess  OZD:[id number] DOB: 12-06-1955 DOA: 12/21/2022 PCP: Ronnald Ramp, MD   Assessment & Plan:   Principal Problem:   Limb ischemia Active Problems:   Hyperkalemia   (HFpEF) heart failure with preserved ejection fraction (HCC)   End-stage renal disease on hemodialysis (HCC)   Essential hypertension   Atrial fibrillation, chronic (HCC)   Tobacco dependence   CAD (coronary artery disease)  Assessment and Plan: Right limb ischemia: w/ worsening b/l limb pain & weakness. Continue on IV heparin drip. Will go for possible re-vascularization tomorrow as per vasc surg.   Chronic diastolic CHF: echo July 2024 EF of 60 to 65% and grade I diastolic dysfunction. Volume management w/ HD. Nephro following and recs apprec    Hyperkalemia: resolved   HTN: BP is on the low end of normal currently    ESRD: on HD TTS. Management as per nephro. Nephro recs apprec s   Tobacco dependence: smokes 1ppd. Smoking cessation counseling received already   Chronic a. fib: continue on IV heparin. Holding home eliquis   Hx of CAD: s/p of CABG. No active CP. Continue on aspirin       DVT prophylaxis: IV heparin  Code Status: full  Family Communication: discussed pt's care w/ pt's wife and answered her questions  Disposition Plan: depends on PT/OT recs (not consulted yet)   Level of care: Stepdown Status is: Inpatient Remains inpatient appropriate because: severity of illness    Consultants:  Vasc surg   Procedures:   Antimicrobials:    Subjective: Pt c/o right leg pain   Objective: Vitals:   12/22/22 0500 12/22/22 0600 12/22/22 0700 12/22/22 0739  BP: 102/69 90/70 103/65   Pulse: 61 65 66   Resp: 12 13 12    Temp:    98.2 F (36.8 C)  TempSrc:    Oral  SpO2: 90% 91% 90%   Weight:      Height:        Intake/Output Summary (Last 24 hours) at 12/22/2022 0912 Last data filed at 12/21/2022 2119 Gross per 24 hour  Intake 920 ml  Output  0 ml  Net 920 ml   Filed Weights   12/21/22 0830 12/21/22 1445 12/21/22 1900  Weight: 53.3 kg 52.9 kg 51.5 kg    Examination:  General exam: Appears calm and comfortable  Respiratory system: decreased breath sounds b/l  Cardiovascular system: S1 & S2 +. No  rubs, gallops or clicks.  Gastrointestinal system: Abdomen is nondistended, soft and nontender. Normal bowel sounds heard. Central nervous system: Alert and oriented.  Psychiatry: Judgement and insight appears at baseline. Mood & affect appropriate.     Data Reviewed: I have personally reviewed following labs and imaging studies  CBC: Recent Labs  Lab 12/21/22 1040 12/22/22 0243  WBC 9.8 7.7  NEUTROABS 7.1  --   HGB 12.2* 13.1  HCT 37.7* 41.2  MCV 90.8 91.8  PLT 292 284   Basic Metabolic Panel: Recent Labs  Lab 12/21/22 1040 12/22/22 0243  NA 134* 134*  K 5.5* 4.2  CL 95* 96*  CO2 20* 24  GLUCOSE 80 86  BUN 62* 34*  CREATININE 8.31* 4.89*  CALCIUM 6.5* 7.2*   GFR: Estimated Creatinine Clearance: 11.1 mL/min (A) (by C-G formula based on SCr of 4.89 mg/dL (H)). Liver Function Tests: Recent Labs  Lab 12/21/22 1040 12/22/22 0243  AST 58* 39  ALT 20 19  ALKPHOS 68 62  BILITOT 0.7 0.8  PROT 6.7 7.2  ALBUMIN 3.2* 4.0   No results for input(s): "LIPASE", "AMYLASE" in the last 168 hours. No results for input(s): "AMMONIA" in the last 168 hours. Coagulation Profile: Recent Labs  Lab 12/21/22 1040  INR 1.3*   Cardiac Enzymes: No results for input(s): "CKTOTAL", "CKMB", "CKMBINDEX", "TROPONINI" in the last 168 hours. BNP (last 3 results) No results for input(s): "PROBNP" in the last 8760 hours. HbA1C: No results for input(s): "HGBA1C" in the last 72 hours. CBG: Recent Labs  Lab 12/21/22 1231 12/21/22 1248  GLUCAP 62* 74   Lipid Profile: No results for input(s): "CHOL", "HDL", "LDLCALC", "TRIG", "CHOLHDL", "LDLDIRECT" in the last 72 hours. Thyroid Function Tests: No results for input(s):  "TSH", "T4TOTAL", "FREET4", "T3FREE", "THYROIDAB" in the last 72 hours. Anemia Panel: No results for input(s): "VITAMINB12", "FOLATE", "FERRITIN", "TIBC", "IRON", "RETICCTPCT" in the last 72 hours. Sepsis Labs: No results for input(s): "PROCALCITON", "LATICACIDVEN" in the last 168 hours.  Recent Results (from the past 240 hour(s))  MRSA Next Gen by PCR, Nasal     Status: None   Collection Time: 12/21/22 12:38 PM   Specimen: Nasal Mucosa; Nasal Swab  Result Value Ref Range Status   MRSA by PCR Next Gen NOT DETECTED NOT DETECTED Final    Comment: (NOTE) The GeneXpert MRSA Assay (FDA approved for NASAL specimens only), is one component of a comprehensive MRSA colonization surveillance program. It is not intended to diagnose MRSA infection nor to guide or monitor treatment for MRSA infections. Test performance is not FDA approved in patients less than 53 years old. Performed at [facility], [address] [place], [place] [address]          Radiology Studies: No results found.      Scheduled Meds:  aspirin EC  81 mg Oral Daily   calcium acetate  1,334 mg Oral TID WC   Chlorhexidine Gluconate Cloth  6 each Topical Q0600   midodrine  10 mg Oral TID WC   sevelamer carbonate  1,600 mg Oral TID WC   sodium chloride flush  10 mL Intravenous Q12H   Continuous Infusions:  anticoagulant sodium citrate     heparin 1,050 Units/hr (12/22/22 0352)     LOS: 1 day      Charise Killian, MD [facility] Pager [phone]  If 7PM-7AM, please contact night-coverage [email or web address]  12/22/2022, 9:12 AM

## 2022-12-22 NOTE — Consult Note (Signed)
PHARMACY - ANTICOAGULATION CONSULT NOTE  Pharmacy Consult for Heparin Continuous Infusion  Indication:  limb ischemia  No Known Allergies  Patient Measurements: Height: 5\' 3"  (160 cm) Weight: 51.5 kg (113 lb 8.6 oz) IBW/kg (Calculated) : 56.9 Heparin Dosing Weight: 53.3 kg  Vital Signs: Temp: 97.4 F (36.3 C) (10/30 1500) Temp Source: Oral (10/30 1500) BP: 102/66 (10/30 1900) Pulse Rate: 64 (10/30 2000)  Labs: Recent Labs    12/21/22 1040 12/21/22 1917 12/22/22 0243 12/22/22 1231 12/22/22 2146  HGB 12.2*  --  13.1  --   --   HCT 37.7*  --  41.2  --   --   PLT 292  --  284  --   --   APTT 36  --   --   --   --   LABPROT 15.9*  --   --   --   --   INR 1.3*  --   --   --   --   HEPARINUNFRC <0.10*   < > 0.16* 0.12* 0.36  CREATININE 8.31*  --  4.89*  --   --    < > = values in this interval not displayed.   Estimated Creatinine Clearance: 11.1 mL/min (A) (by C-G formula based on SCr of 4.89 mg/dL (H)).  Medical History: Past Medical History:  Diagnosis Date   Aortic atherosclerosis (HCC)    Bilateral carotid artery disease (HCC)    Bladder cancer (HCC)    Coronary artery disease 12/20/2018   a.) LHC 12/20/2018: 50% OM1, 40% OM2, 95% o-pLAD, 75% o=pLCx, 40% mLM, 70% D1, 60% mRCA-1, 50% mRCA-2; refer to CVTS. b.) 4v CABG at [facility] on 12/27/2018: LIMA-LAD, RIMA-PDA, seg LRA-OM1-D1   DCM (dilated cardiomyopathy) (HCC) 12/05/2018   a.) TTE 12/05/2018: EF 40-45%. b.) TTE 12/28/2019: EF 20-25%.   Dyspnea 10/15/2022   ESRD (end stage renal disease) (HCC)    a.) T-Th-Sat   HFrEF (heart failure with reduced ejection fraction) (HCC) 12/05/2018   a.) TTE 12/05/2018: EF 40-45%; mild LVH; ant/apical/sep HK; mild TR . b.) TTE 12/28/2019: EF 20-25%; mod LVH; mod MR/AR; G1DD.   History of 2019 novel coronavirus disease (COVID-19) 04/08/2021   History of kidney stones    HLD (hyperlipidemia)    Hx of CABG 12/27/2018   Hypertension    Infrarenal abdominal aortic aneurysm (AAA)  without rupture (HCC) 03/05/2021   a.) CT abd/pelvis; measured 3.2 cm.   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 Last dose: some time last week (has been not taking d/t planned RLE angio)  Assessment: 67 year old patient who had planned RLE angiography with vascular today. However, patient was found to be hyperkalemic prior to the procedure(K: 5.4). Per vascular lab, procedure will not be done today, patient will be admitted until hyperkalemia is resolved. Was taking apixaban PTA but held dose for multiple days in anticipation of procedure.  Baseline labs: aPTT 36 sec, HL<0.10, INR 1.3, Plts 292, Hgb 12.2  Goal of Therapy:  Heparin level 0.3-0.7 units/ml Heparin level 66-102 units/ml Monitor platelets by anticoagulation protocol: Yes   Labs: 10/29 1917 HL 0.4, therapeutic x 1  10/30 0243 HL 0.16, subtherapeutic  10/30 1231 HL 0.12, subtherapeutic  10/30 2146 HL 0.36, therapeutic x 1  Plan:  HL is therapeutic Continue heparin infusion at 1250 units/hr Recheck HL in 8 hours to confirm Continue to monitor H&H and platelets  Barrie Folk, PharmD 12/22/2022 10:18 PM

## 2022-12-22 NOTE — Progress Notes (Signed)
Follow-up note  I spoke with the patient again this afternoon regarding transfer to [facility] vein and vascular for procedure.  I informed the patient that I had spoken with Dr. Chestine Spore from [facility] and that the transfer would not be viable at this time due to scheduling their being the same as the schedule here.  I informed the patient that we were planning on doing his procedure tomorrow on 12/23/2022.  We discussed in detail the procedure, benefits, risk, and complications.  I discussed in detail the possibility he may need a lysis catheter overnight and therefore needing to return to the lab on Friday, 12/24/2022 post lysis therapy.  Patient verbalizes understanding.  Patient endorses that he is willing to stay for the procedure to help with his right lower extremity ischemia.  Patient made n.p.o. after midnight tonight for procedure tomorrow.  I discussed in detail the plan with Dr. [name] MD, Dr. Elbert Ewings MD and he is in agreement with the plan.  Patient was unable to get his wife on the phone to discuss the procedure.  Nursing is informed the patient's wife requests a phone call from vein and vascular to discuss the procedure.  I relayed this information to Dr. Vilinda Flake MD and he endorses he will call her sometime today when he gets a chance.

## 2022-12-22 NOTE — Progress Notes (Signed)
[facility]  ROUNDING NOTE   Subjective:   Anthony Hess is a 67 year old male with past medical conditions including hypertension, PVD, dyslipidemia, CAD, four-vessel CABG, and end-stage renal disease on hemodialysis. Patient presents for outpatient angiogram with vascular surgery and was found to have abnormal labs. He is bring admitted for Limb ischemia [I99.8]  Patient is known to our practice and receives outpatient dialysis at [facility] on a TTS schedule, followed by Dr Cherylann Ratel.    Patient seen laying in bed in ICU Heparin drip in place Denies dizziness   Objective:  Vital signs in last 24 hours:  Temp:  [97.8 F (36.6 C)-98.2 F (36.8 C)] 98.2 F (36.8 C) (10/30 0739) Pulse Rate:  [52-94] 66 (10/30 0700) Resp:  [11-20] 12 (10/30 0700) BP: (74-148)/(49-107) 103/65 (10/30 0700) SpO2:  [88 %-100 %] 90 % (10/30 0700) Weight:  [51.5 kg-52.9 kg] 51.5 kg (10/29 1900)  Weight change:  Filed Weights   12/21/22 0830 12/21/22 1445 12/21/22 1900  Weight: 53.3 kg 52.9 kg 51.5 kg    Intake/Output: I/O last 3 completed shifts: In: 920 [P.O.:650; I.V.:99.5; IV Piggyback:170.5] Out: 0    Intake/Output this shift:  No intake/output data recorded.  Physical Exam: General: NAD  Head: Normocephalic, atraumatic. Moist oral mucosal membranes  Eyes: Anicteric  Neck: Supple  Lungs:  Clear to auscultation, normal effort, room air  Heart: Regular rate and rhythm  Abdomen:  Soft, nontender  Extremities:  No peripheral edema.  Neurologic: Alert and oriented, moving all four extremities  Skin: No lesions  Access: Rt permcath    Basic Metabolic Panel: Recent Labs  Lab 12/21/22 1040 12/22/22 0243  NA 134* 134*  K 5.5* 4.2  CL 95* 96*  CO2 20* 24  GLUCOSE 80 86  BUN 62* 34*  CREATININE 8.31* 4.89*  CALCIUM 6.5* 7.2*    Liver Function Tests: Recent Labs  Lab 12/21/22 1040 12/22/22 0243  AST 58* 39  ALT 20 19  ALKPHOS 68 62  BILITOT 0.7 0.8  PROT 6.7 7.2   ALBUMIN 3.2* 4.0   No results for input(s): "LIPASE", "AMYLASE" in the last 168 hours. No results for input(s): "AMMONIA" in the last 168 hours.  CBC: Recent Labs  Lab 12/21/22 1040 12/22/22 0243  WBC 9.8 7.7  NEUTROABS 7.1  --   HGB 12.2* 13.1  HCT 37.7* 41.2  MCV 90.8 91.8  PLT 292 284    Cardiac Enzymes: No results for input(s): "CKTOTAL", "CKMB", "CKMBINDEX", "TROPONINI" in the last 168 hours.  BNP: Invalid input(s): "POCBNP"  CBG: Recent Labs  Lab 12/21/22 1231 12/21/22 1248  GLUCAP 62* 74    Microbiology: Results for orders placed or performed during the hospital encounter of 12/21/22  MRSA Next Gen by PCR, Nasal     Status: None   Collection Time: 12/21/22 12:38 PM   Specimen: Nasal Mucosa; Nasal Swab  Result Value Ref Range Status   MRSA by PCR Next Gen NOT DETECTED NOT DETECTED Final    Comment: (NOTE) The GeneXpert MRSA Assay (FDA approved for NASAL specimens only), is one component of a comprehensive MRSA colonization surveillance program. It is not intended to diagnose MRSA infection nor to guide or monitor treatment for MRSA infections. Test performance is not FDA approved in patients less than 31 years old. Performed at [facility], [address] [place], [place] [address]     Coagulation Studies: Recent Labs    12/21/22 1040  LABPROT 15.9*  INR 1.3*  Urinalysis: No results for input(s): "COLORURINE", "LABSPEC", "PHURINE", "GLUCOSEU", "HGBUR", "BILIRUBINUR", "KETONESUR", "PROTEINUR", "UROBILINOGEN", "NITRITE", "LEUKOCYTESUR" in the last 72 hours.  Invalid input(s): "APPERANCEUR"    Imaging: No results found.   Medications:    anticoagulant sodium citrate     heparin 1,050 Units/hr (12/22/22 0352)    aspirin EC  81 mg Oral Daily   calcium acetate  1,334 mg Oral TID WC   Chlorhexidine Gluconate Cloth  6 each Topical Q0600   midodrine  10 mg Oral TID WC   sevelamer carbonate  1,600 mg Oral TID WC   sodium  chloride flush  10 mL Intravenous Q12H   alteplase, anticoagulant sodium citrate, feeding supplement (NEPRO CARB STEADY), heparin, HYDROmorphone (DILAUDID) injection, lidocaine (PF), lidocaine-prilocaine, ondansetron **OR** ondansetron (ZOFRAN) IV, oxyCODONE-acetaminophen, pentafluoroprop-tetrafluoroeth  Assessment/ Plan:  [name] is a 67 y.o.  male with past medical conditions including hypertension, PVD, dyslipidemia, CAD, four-vessel CABG, and end-stage renal disease on hemodialysis. Patient presents for outpatient angiogram with vascular surgery and was found to have abnormal labs. He is bring admitted for Limb ischemia [I99.8]   End stage renal disease with hyperkalemia on hemodialysis. Potassium corrected with dialysis. UF 1.5L achieved. Next treatment scheduled for Thursday.  2. Anemia of chronic kidney disease Lab Results  Component Value Date   HGB 13.1 12/22/2022    Hgb remains acceptable. No need for ESA  3. Secondary Hyperparathyroidism: with outpatient labs: None available  Lab Results  Component Value Date   PTH 170 (H) 06/25/2022   CALCIUM 7.2 (L) 12/22/2022   CAION 0.82 (LL) 10/13/2022   PHOS 9.4 (H) 11/17/2022    Hypocalcemia noted, improved overnight from 6.5 to 7.2. Will continue to monitor bone minerals during this admission. Will receive some calcium supplementation with dialysis. Sevelamer and calcium acetate ordered with meals.   4. Hypotension with history of hypertension with chronic kidney disease. Home regimen includes amlodipine and isosorbide. Both held. Receiving Midodrine.    LOS: 1 Anthony Hess 10/30/20249:39 AM

## 2022-12-22 NOTE — Progress Notes (Signed)
I attempted to call the patient's spouse as requested.  My call went to voicemail.  I did leave a message that essentially explained we are not certain where all the blockages are or what we are up against.  That we would always start with an angiogram to get pictures and at this point the intention is to try to dissolve the clot.  Hopefully we will be able to get blood flow reestablished with stents and perhaps balloon angioplasty.  But at this point without having a full set of pictures we cannot exactly say what the solution is.

## 2022-12-23 ENCOUNTER — Encounter: Payer: Self-pay | Admitting: Family Medicine

## 2022-12-23 ENCOUNTER — Encounter
Admission: RE | Disposition: A | Discharge status: Home / Self Care | Payer: Self-pay | Source: Home / Self Care | Attending: Internal Medicine

## 2022-12-23 DIAGNOSIS — I998 Other disorder of circulatory system: Secondary | ICD-10-CM | POA: Diagnosis not present

## 2022-12-23 DIAGNOSIS — L7602 Intraoperative hemorrhage and hematoma of skin and subcutaneous tissue complicating other procedure: Secondary | ICD-10-CM

## 2022-12-23 DIAGNOSIS — I70221 Atherosclerosis of native arteries of extremities with rest pain, right leg: Principal | ICD-10-CM

## 2022-12-23 HISTORY — PX: LOWER EXTREMITY ANGIOGRAPHY: CATH118251

## 2022-12-23 LAB — CBC
HCT: 40.2 % (ref 39.0–52.0)
Hemoglobin: 12.9 g/dL — ABNORMAL LOW (ref 13.0–17.0)
MCH: 29.3 pg (ref 26.0–34.0)
MCHC: 32.1 g/dL (ref 30.0–36.0)
MCV: 91.2 fL (ref 80.0–100.0)
Platelets: 308 10*3/uL (ref 150–400)
RBC: 4.41 MIL/uL (ref 4.22–5.81)
RDW: 16.8 % — ABNORMAL HIGH (ref 11.5–15.5)
WBC: 7.9 10*3/uL (ref 4.0–10.5)
nRBC: 0 % (ref 0.0–0.2)

## 2022-12-23 LAB — BASIC METABOLIC PANEL
Anion gap: 17 — ABNORMAL HIGH (ref 5–15)
BUN: 63 mg/dL — ABNORMAL HIGH (ref 8–23)
CO2: 22 mmol/L (ref 22–32)
Calcium: 6.7 mg/dL — ABNORMAL LOW (ref 8.9–10.3)
Chloride: 98 mmol/L (ref 98–111)
Creatinine, Ser: 7.72 mg/dL — ABNORMAL HIGH (ref 0.61–1.24)
GFR, Estimated: 7 mL/min — ABNORMAL LOW (ref >60)
Glucose, Bld: 82 mg/dL (ref 70–99)
Potassium: 4.4 mmol/L (ref 3.5–5.1)
Sodium: 137 mmol/L (ref 135–145)

## 2022-12-23 LAB — HEPARIN LEVEL (UNFRACTIONATED): Heparin Unfractionated: 0.3 [IU]/mL (ref 0.30–0.70)

## 2022-12-23 SURGERY — LOWER EXTREMITY ANGIOGRAPHY
Anesthesia: Moderate Sedation | Laterality: Right

## 2022-12-23 MED ORDER — LABETALOL HCL 5 MG/ML IV SOLN
INTRAVENOUS | Status: AC
Start: 2022-12-23 — End: ?
  Filled 2022-12-23: qty 4

## 2022-12-23 MED ORDER — MIDAZOLAM HCL 5 MG/5ML IJ SOLN
INTRAMUSCULAR | Status: AC
Start: 2022-12-23 — End: ?
  Filled 2022-12-23: qty 5

## 2022-12-23 MED ORDER — IODIXANOL 320 MG/ML IV SOLN
INTRAVENOUS | Status: DC | PRN
  Administered 2022-12-23: 70 mL

## 2022-12-23 MED ORDER — METHYLPREDNISOLONE SODIUM SUCC 125 MG IJ SOLR: 125.0000 mg | Freq: Once | INTRAMUSCULAR | Status: DC | PRN

## 2022-12-23 MED ORDER — CEFAZOLIN SODIUM-DEXTROSE 1-4 GM/50ML-% IV SOLN
INTRAVENOUS | Status: AC
  Filled 2022-12-23: qty 50

## 2022-12-23 MED ORDER — HYDRALAZINE HCL 20 MG/ML IJ SOLN
20.0000 mg | Freq: Four times a day (QID) | INTRAMUSCULAR | Status: DC | PRN
  Administered 2022-12-23 – 2022-12-25 (×3): 20 mg via INTRAVENOUS
  Filled 2022-12-23 (×3): qty 1

## 2022-12-23 MED ORDER — HEPARIN SODIUM (PORCINE) 1000 UNIT/ML IJ SOLN
INTRAMUSCULAR | Status: AC
  Filled 2022-12-23: qty 10

## 2022-12-23 MED ORDER — FENTANYL CITRATE (PF) 100 MCG/2ML IJ SOLN: 12.5000 ug | Freq: Once | INTRAMUSCULAR | Status: DC | PRN

## 2022-12-23 MED ORDER — FENTANYL CITRATE (PF) 100 MCG/2ML IJ SOLN
INTRAMUSCULAR | Status: AC
  Filled 2022-12-23: qty 2

## 2022-12-23 MED ORDER — DIPHENHYDRAMINE HCL 50 MG/ML IJ SOLN: 50.0000 mg | Freq: Once | INTRAMUSCULAR | Status: DC | PRN

## 2022-12-23 MED ORDER — HEPARIN SODIUM (PORCINE) 1000 UNIT/ML IJ SOLN
INTRAMUSCULAR | Status: DC | PRN
  Administered 2022-12-23: 5000 [IU] via INTRAVENOUS

## 2022-12-23 MED ORDER — LIDOCAINE-EPINEPHRINE (PF) 1 %-1:200000 IJ SOLN
INTRAMUSCULAR | Status: DC | PRN
  Administered 2022-12-23: 10 mL

## 2022-12-23 MED ORDER — MIDAZOLAM HCL 2 MG/2ML IJ SOLN
INTRAMUSCULAR | Status: DC | PRN
  Administered 2022-12-23 (×2): 1 mg via INTRAVENOUS

## 2022-12-23 MED ORDER — SODIUM CHLORIDE 0.9 % IV SOLN: INTRAVENOUS | Status: DC

## 2022-12-23 MED ORDER — MIDAZOLAM HCL 2 MG/ML PO SYRP: 8.0000 mg | ORAL_SOLUTION | Freq: Once | ORAL | Status: DC | PRN

## 2022-12-23 MED ORDER — HYDROMORPHONE HCL 1 MG/ML IJ SOLN
INTRAMUSCULAR | Status: AC
  Filled 2022-12-23: qty 1

## 2022-12-23 MED ORDER — LABETALOL HCL 5 MG/ML IV SOLN
INTRAVENOUS | Status: DC | PRN
  Administered 2022-12-23: 20 mg via INTRAVENOUS

## 2022-12-23 MED ORDER — FENTANYL CITRATE (PF) 100 MCG/2ML IJ SOLN
INTRAMUSCULAR | Status: DC | PRN
  Administered 2022-12-23: 25 ug via INTRAVENOUS
  Administered 2022-12-23: 50 ug via INTRAVENOUS
  Administered 2022-12-23: 25 ug via INTRAVENOUS

## 2022-12-23 MED ORDER — CLOPIDOGREL BISULFATE 75 MG PO TABS
75.0000 mg | ORAL_TABLET | Freq: Every day | ORAL | Status: DC
  Administered 2022-12-23 – 2022-12-24 (×2): 75 mg via ORAL
  Filled 2022-12-23 (×2): qty 1

## 2022-12-23 MED ORDER — HEPARIN (PORCINE) IN NACL 1000-0.9 UT/500ML-% IV SOLN
INTRAVENOUS | Status: DC | PRN
  Administered 2022-12-23: 1000 mL

## 2022-12-23 MED ORDER — FAMOTIDINE 20 MG PO TABS: 40.0000 mg | ORAL_TABLET | Freq: Once | ORAL | Status: DC | PRN

## 2022-12-23 MED ORDER — HYDROMORPHONE HCL 1 MG/ML IJ SOLN
1.0000 mg | Freq: Once | INTRAMUSCULAR | Status: AC
  Administered 2022-12-23: 1 mg via INTRAVENOUS

## 2022-12-23 MED ORDER — CEFAZOLIN SODIUM-DEXTROSE 1-4 GM/50ML-% IV SOLN: 1.0000 g | INTRAVENOUS | Status: DC

## 2022-12-23 MED ORDER — ATORVASTATIN CALCIUM 20 MG PO TABS
20.0000 mg | ORAL_TABLET | Freq: Every day | ORAL | Status: DC
  Administered 2022-12-23 – 2022-12-26 (×4): 20 mg via ORAL
  Filled 2022-12-23 (×4): qty 1

## 2022-12-23 MED ORDER — CEFAZOLIN SODIUM-DEXTROSE 2-4 GM/100ML-% IV SOLN: 2.0000 g | INTRAVENOUS | Status: DC

## 2022-12-23 SURGICAL SUPPLY — 20 items
BALLN LUTONIX 018 5X300X130 (BALLOONS) ×2 IMPLANT
BALLN ULTRVRSE 2X300X150 (BALLOONS) ×1
BALLN ULTRVRSE 2X300X150 OTW (BALLOONS) ×1 IMPLANT
BALLOON LUTONIX 018 5X300X130 (BALLOONS) IMPLANT
BALLOON ULTRVRSE 2X300X150 OTW (BALLOONS) IMPLANT
CATH ANGIO 5F PIGTAIL 65CM (CATHETERS) IMPLANT
CATH BEACON 5 .038 100 VERT TP (CATHETERS) IMPLANT
CATH NAVICROSS ANGLED 135CM (MICROCATHETER) IMPLANT
COVER PROBE ULTRASOUND 5X96 (MISCELLANEOUS) IMPLANT
DEVICE STARCLOSE SE CLOSURE (Vascular Products) IMPLANT
GLIDEWIRE ADV .035X260CM (WIRE) IMPLANT
GUIDEWIRE PFTE-COATED .018X300 (WIRE) IMPLANT
PACK ANGIOGRAPHY (CUSTOM PROCEDURE TRAY) ×1 IMPLANT
SHEATH ANL2 6FRX45 HC (SHEATH) IMPLANT
SHEATH BRITE TIP 5FRX11 (SHEATH) IMPLANT
STENT VIABAHN 6X150X120 (Permanent Stent) IMPLANT
STENT VIABAHN 6X250X120 (Permanent Stent) IMPLANT
TUBING CONTRAST HIGH PRESS 72 (TUBING) IMPLANT
WIRE EMERALD 3MM-J .035X150CM (WIRE) IMPLANT
WIRE G V18X300CM (WIRE) IMPLANT

## 2022-12-23 NOTE — Progress Notes (Signed)
PROGRESS NOTE    Anthony Hess  WUJ:[id number] DOB: 10-26-1955 DOA: 12/21/2022 PCP: Ronnald Ramp, MD   Assessment & Plan:   Principal Problem:   Limb ischemia Active Problems:   Hyperkalemia   (HFpEF) heart failure with preserved ejection fraction (HCC)   End-stage renal disease on hemodialysis (HCC)   Essential hypertension   Atrial fibrillation, chronic (HCC)   Tobacco dependence   CAD (coronary artery disease)  Assessment and Plan: Right limb ischemia: w/ worsening b/l limb pain & weakness. Continue on IV heparin drip as per vasc surg. Will go for angiogram today as per vasc surg   Chronic diastolic CHF: echo July 2024 EF of 60 to 65% and grade I diastolic dysfunction. Volume management w/ HD. Nephro following and recs apprec   Hyperkalemia: resolved   HTN: holding home dose of amlodipine as BP is on low end of normal    ESRD: on HD TTS. Nephro following and recs apprec    Tobacco dependence: smokes 1ppd. Smoking cessation counseling received already   Chronic a. fib: continue on IV heparin drip. Holding home eliquis  Hx of CAD: s/p of CABG. No active CP. Holding aspirin       DVT prophylaxis: IV heparin  Code Status: full  Family Communication:  Disposition Plan: depends on PT/OT recs (not consulted yet)   Level of care: Stepdown Status is: Inpatient Remains inpatient appropriate because: severity of illness    Consultants:  Vasc surg   Procedures:   Antimicrobials:    Subjective: Pt c/o right leg pain   Objective: Vitals:   12/23/22 1152 12/23/22 1200 12/23/22 1300 12/23/22 1424  BP:  (!) 121/94 (!) 140/98 (!) 122/99  Pulse:  67 71 69  Resp:  12 14 13   Temp:    98.2 F (36.8 C)  TempSrc:    Oral  SpO2:   93% 96%  Weight: 51 kg   51 kg  Height:    5\' 3"  (1.6 m)    Intake/Output Summary (Last 24 hours) at 12/23/2022 1507 Last data filed at 12/23/2022 1147 Gross per 24 hour  Intake 452.03 ml  Output 400 ml  Net 52.03 ml    Filed Weights   12/23/22 0751 12/23/22 1152 12/23/22 1424  Weight: 51.5 kg 51 kg 51 kg    Examination:  General exam: Appears comfortable  Respiratory system: diminished breath sounds b/l  Cardiovascular system: S1/S2+. No rubs or clicks  Gastrointestinal system: abd is soft, NT, ND & hypoactive bowel sounds  Central nervous system:alert & oriented.   Psychiatry: judgement and insight appears at baseline. Flat mood and affect    Data Reviewed: I have personally reviewed following labs and imaging studies  CBC: Recent Labs  Lab 12/21/22 1040 12/22/22 0243 12/23/22 0513  WBC 9.8 7.7 7.9  NEUTROABS 7.1  --   --   HGB 12.2* 13.1 12.9*  HCT 37.7* 41.2 40.2  MCV 90.8 91.8 91.2  PLT 292 284 308   Basic Metabolic Panel: Recent Labs  Lab 12/21/22 1040 12/22/22 0243 12/23/22 0513  NA 134* 134* 137  K 5.5* 4.2 4.4  CL 95* 96* 98  CO2 20* 24 22  GLUCOSE 80 86 82  BUN 62* 34* 63*  CREATININE 8.31* 4.89* 7.72*  CALCIUM 6.5* 7.2* 6.7*   GFR: Estimated Creatinine Clearance: 7 mL/min (A) (by C-G formula based on SCr of 7.72 mg/dL (H)). Liver Function Tests: Recent Labs  Lab 12/21/22 1040 12/22/22 0243  AST 58* 39  ALT 20  19  ALKPHOS 68 62  BILITOT 0.7 0.8  PROT 6.7 7.2  ALBUMIN 3.2* 4.0   No results for input(s): "LIPASE", "AMYLASE" in the last 168 hours. No results for input(s): "AMMONIA" in the last 168 hours. Coagulation Profile: Recent Labs  Lab 12/21/22 1040  INR 1.3*   Cardiac Enzymes: No results for input(s): "CKTOTAL", "CKMB", "CKMBINDEX", "TROPONINI" in the last 168 hours. BNP (last 3 results) No results for input(s): "PROBNP" in the last 8760 hours. HbA1C: No results for input(s): "HGBA1C" in the last 72 hours. CBG: Recent Labs  Lab 12/21/22 1231 12/21/22 1248  GLUCAP 62* 74   Lipid Profile: No results for input(s): "CHOL", "HDL", "LDLCALC", "TRIG", "CHOLHDL", "LDLDIRECT" in the last 72 hours. Thyroid Function Tests: No results for  input(s): "TSH", "T4TOTAL", "FREET4", "T3FREE", "THYROIDAB" in the last 72 hours. Anemia Panel: No results for input(s): "VITAMINB12", "FOLATE", "FERRITIN", "TIBC", "IRON", "RETICCTPCT" in the last 72 hours. Sepsis Labs: No results for input(s): "PROCALCITON", "LATICACIDVEN" in the last 168 hours.  Recent Results (from the past 240 hour(s))  MRSA Next Gen by PCR, Nasal     Status: None   Collection Time: 12/21/22 12:38 PM   Specimen: Nasal Mucosa; Nasal Swab  Result Value Ref Range Status   MRSA by PCR Next Gen NOT DETECTED NOT DETECTED Final    Comment: (NOTE) The GeneXpert MRSA Assay (FDA approved for NASAL specimens only), is one component of a comprehensive MRSA colonization surveillance program. It is not intended to diagnose MRSA infection nor to guide or monitor treatment for MRSA infections. Test performance is not FDA approved in patients less than 65 years old. Performed at [facility], [address], [place], [place] [address]          Radiology Studies: No results found.      Scheduled Meds:  [MAR Hold] aspirin EC  81 mg Oral Daily   [MAR Hold] calcium acetate  1,334 mg Oral TID WC   [MAR Hold] Chlorhexidine Gluconate Cloth  6 each Topical Q0600   [MAR Hold] midodrine  10 mg Oral TID WC   [MAR Hold] sevelamer carbonate  1,600 mg Oral TID WC   [MAR Hold] sodium chloride flush  10 mL Intravenous Q12H   Continuous Infusions:  sodium chloride 75 mL/hr at 12/23/22 1417    ceFAZolin (ANCEF) IV     heparin Stopped (12/23/22 1443)     LOS: 2 days      Charise Killian, MD [facility] Pager [phone]  If 7PM-7AM, please contact night-coverage [email or web address]  12/23/2022, 3:07 PM

## 2022-12-23 NOTE — Plan of Care (Signed)

## 2022-12-23 NOTE — Progress Notes (Signed)
Received patient in bed to unit.    Informed consent signed and in chart.    TX duration: 3.5 hrs     Transported back to floor  Hand-off given to patient's nurse.   Access used:  rt chest cvc Access issues: n/a  Total UF removed: 400 mls Medication(s) given:n/a Post HD weight: 51.0kg      Maple Hudson, RN Dialysis Unit

## 2022-12-23 NOTE — Progress Notes (Signed)
Progress Note    12/23/2022 11:47 AM 2 Days Post-Op  Subjective:  Anthony Hess is a 67 yo male who was scheduled for a right lower extremity angiogram as an outpatient yesterday on 12/21/2022.  Patient's case was canceled due to elevated potassium at 5.4 and patient was admitted to the hospital for dialysis and reschedule of right lower extremity angiogram.  Yesterday I had a long conversation with the patient and his wife on the phone about the need to be hospitalized on a heparin infusion due to critical limb loss without further treatment.  Both verbalized her understanding at that time.   On exam this morning patient is resting comfortably in bed.  He still endorses pain to his right lower extremity.  Right lower extremity remains cool to touch and somewhat dusky.  However it does feel warmer since he has been on a heparin infusion since I last saw him in ICU yesterday and the emergency room.  No other complaints overnight vitals remained stable.   Vitals:   12/23/22 1130 12/23/22 1142  BP: 123/73 (!) 121/91  Pulse: 67 (!) 49  Resp: 13 12  Temp:    SpO2:     Physical Exam: Cardiac:  RRR, No murmurs Lungs:  Clar on auscultation. No rales, rhonchi or wheezing Incisions:  None Extremities:  Right lower extremity blue and dusky. Cool to touch without palpable or doppler pulses but does feel warmer today than yesterday after being on the heparin infusion.   Abdomen:  Positive bowel sounds, soft, non tender and non distended.  Neurologic: AAOX3 answers questions appropriately and follows commands.   CBC    Component Value Date/Time   WBC 7.9 12/23/2022 0513   RBC 4.41 12/23/2022 0513   HGB 12.9 (L) 12/23/2022 0513   HGB 9.1 (L) 01/04/2020 1540   HCT 40.2 12/23/2022 0513   HCT 29.6 (L) 01/04/2020 1540   PLT 308 12/23/2022 0513   PLT 191 01/04/2020 1540   MCV 91.2 12/23/2022 0513   MCV 94 01/04/2020 1540   MCH 29.3 12/23/2022 0513   MCHC 32.1 12/23/2022 0513   RDW 16.8 (H)  12/23/2022 0513   RDW 16.2 (H) 01/04/2020 1540   LYMPHSABS 0.9 12/21/2022 1040   LYMPHSABS 1.0 01/04/2020 1540   MONOABS 1.3 (H) 12/21/2022 1040   EOSABS 0.4 12/21/2022 1040   EOSABS 0.5 (H) 03/27/2018 1643   BASOSABS 0.1 12/21/2022 1040   BASOSABS 0.0 03/27/2018 1643    BMET    Component Value Date/Time   NA 137 12/23/2022 0513   NA 141 06/25/2022 1414   K 4.4 12/23/2022 0513   CL 98 12/23/2022 0513   CO2 22 12/23/2022 0513   GLUCOSE 82 12/23/2022 0513   BUN 63 (H) 12/23/2022 0513   BUN 39 (H) 06/25/2022 1414   CREATININE 7.72 (H) 12/23/2022 0513   CREATININE 4.62 (H) 01/10/2019 1351   CALCIUM 6.7 (L) 12/23/2022 0513   GFRNONAA 7 (L) 12/23/2022 0513   GFRAA 15 (L) 01/04/2020 1542    INR    Component Value Date/Time   INR 1.3 (H) 12/21/2022 1040     Intake/Output Summary (Last 24 hours) at 12/23/2022 1147 Last data filed at 12/23/2022 0710 Gross per 24 hour  Intake 925.17 ml  Output --  Net 925.17 ml     Assessment/Plan:  67 y.o. male who presents to [facility] emergency department with right lower extremity ischemia and pain.  2 Days Post-Op   PLAN: Vascular surgery plans on taking the patient  to the vascular lab today for right lower extremity angiogram with possible intervention.  This morning again I discussed in detail with the patient the procedure, benefits, risk, and complications.  Patient verbalizes understanding and wishes to proceed as soon as possible.  Patient will go to hemodialysis this morning prior to procedure.  Patient has been n.p.o. since midnight last night.  I discussed the plan in detail with Dr. [name] MD and he is in agreement with the plan.  DVT prophylaxis:  Heparin Infusion    Marcie Bal [facility] 12/23/2022 11:47 AM

## 2022-12-23 NOTE — Consult Note (Signed)
PHARMACY - ANTICOAGULATION CONSULT NOTE  Pharmacy Consult for Heparin Continuous Infusion  Indication:  limb ischemia  No Known Allergies  Patient Measurements: Height: 5\' 3"  (160 cm) Weight: 51 kg (112 lb 7 oz) IBW/kg (Calculated) : 56.9 Heparin Dosing Weight: 53.3 kg  Vital Signs: Temp: 98.2 F (36.8 C) (10/31 1424) Temp Source: Oral (10/31 1424) BP: 204/112 (10/31 1750) Pulse Rate: 45 (10/31 1700)  Labs: Recent Labs    12/21/22 1040 12/21/22 1917 12/22/22 0243 12/22/22 1231 12/22/22 2146 12/23/22 0513  HGB 12.2*  --  13.1  --   --  12.9*  HCT 37.7*  --  41.2  --   --  40.2  PLT 292  --  284  --   --  308  APTT 36  --   --   --   --   --   LABPROT 15.9*  --   --   --   --   --   INR 1.3*  --   --   --   --   --   HEPARINUNFRC <0.10*   < > 0.16* 0.12* 0.36 0.30  CREATININE 8.31*  --  4.89*  --   --  7.72*   < > = values in this interval not displayed.   Estimated Creatinine Clearance: 7 mL/min (A) (by C-G formula based on SCr of 7.72 mg/dL (H)).  Medical History: Past Medical History:  Diagnosis Date   Aortic atherosclerosis (HCC)    Bilateral carotid artery disease (HCC)    Bladder cancer (HCC)    Coronary artery disease 12/20/2018   a.) LHC 12/20/2018: 50% OM1, 40% OM2, 95% o-pLAD, 75% o=pLCx, 40% mLM, 70% D1, 60% mRCA-1, 50% mRCA-2; refer to CVTS. b.) 4v CABG at [facility] on 12/27/2018: LIMA-LAD, RIMA-PDA, seg LRA-OM1-D1   DCM (dilated cardiomyopathy) (HCC) 12/05/2018   a.) TTE 12/05/2018: EF 40-45%. b.) TTE 12/28/2019: EF 20-25%.   Dyspnea 10/15/2022   ESRD (end stage renal disease) (HCC)    a.) T-Th-Sat   HFrEF (heart failure with reduced ejection fraction) (HCC) 12/05/2018   a.) TTE 12/05/2018: EF 40-45%; mild LVH; ant/apical/sep HK; mild TR . b.) TTE 12/28/2019: EF 20-25%; mod LVH; mod MR/AR; G1DD.   History of 2019 novel coronavirus disease (COVID-19) 04/08/2021   History of kidney stones    HLD (hyperlipidemia)    Hx of CABG 12/27/2018   Hypertension     Infrarenal abdominal aortic aneurysm (AAA) without rupture (HCC) 03/05/2021   a.) CT abd/pelvis; measured 3.2 cm.   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 Last dose: some time last week (has been not taking d/t planned RLE angio)  Assessment: 67 year old patient who had planned RLE angiography with vascular today. However, patient was found to be hyperkalemic prior to the procedure(K: 5.4). Per vascular lab, procedure will not be done today, patient will be admitted until hyperkalemia is resolved. Was taking apixaban PTA but held dose for multiple days in anticipation of procedure.  Baseline labs: aPTT 36 sec, HL<0.10, INR 1.3, Plts 292, Hgb 12.2  Goal of Therapy:  Heparin level 0.3-0.7 units/ml Heparin level 66-102 units/ml  Monitor platelets by anticoagulation protocol: Yes   Labs: 10/29 1917 HL 0.4, therapeutic x 1 10/30 0243 HL 0.16, subtherapeutic  10/30 1231 HL 0.12, subtherapeutic  10/30 2146 HL 0.36, therapeutic x 1 10/31 0513 HL 0.30, therapeutic x 2  Plan:  Heparin infusion resumed at 1250 units/hr, 10/31 @ 1745 Check Anti-Xa level in 8 hours after resumption Continue to monitor H&H and platelets  Barrie Folk, PharmD 12/23/2022 6:18 PM

## 2022-12-23 NOTE — Progress Notes (Signed)
Transport arrived to take patient to HD during shift report. Patient in bed, A/O x4,NSR, afebrile, stable pressures. Pulses dopplered.

## 2022-12-23 NOTE — H&P (View-Only) (Signed)
Progress Note    12/23/2022 11:47 AM 2 Days Post-Op  Subjective:  Anthony Hess is a 67 yo male who was scheduled for a right lower extremity angiogram as an outpatient yesterday on 12/21/2022.  Patient's case was canceled due to elevated potassium at 5.4 and patient was admitted to the hospital for dialysis and reschedule of right lower extremity angiogram.  Yesterday I had a long conversation with the patient and his wife on the phone about the need to be hospitalized on a heparin infusion due to critical limb loss without further treatment.  Both verbalized her understanding at that time.   On exam this morning patient is resting comfortably in bed.  He still endorses pain to his right lower extremity.  Right lower extremity remains cool to touch and somewhat dusky.  However it does feel warmer since he has been on a heparin infusion since I last saw him in ICU yesterday and the emergency room.  No other complaints overnight vitals remained stable.   Vitals:   12/23/22 1130 12/23/22 1142  BP: 123/73 (!) 121/91  Pulse: 67 (!) 49  Resp: 13 12  Temp:    SpO2:     Physical Exam: Cardiac:  RRR, No murmurs Lungs:  Clar on auscultation. No rales, rhonchi or wheezing Incisions:  None Extremities:  Right lower extremity blue and dusky. Cool to touch without palpable or doppler pulses but does feel warmer today than yesterday after being on the heparin infusion.   Abdomen:  Positive bowel sounds, soft, non tender and non distended.  Neurologic: AAOX3 answers questions appropriately and follows commands.   CBC    Component Value Date/Time   WBC 7.9 12/23/2022 0513   RBC 4.41 12/23/2022 0513   HGB 12.9 (L) 12/23/2022 0513   HGB 9.1 (L) 01/04/2020 1540   HCT 40.2 12/23/2022 0513   HCT 29.6 (L) 01/04/2020 1540   PLT 308 12/23/2022 0513   PLT 191 01/04/2020 1540   MCV 91.2 12/23/2022 0513   MCV 94 01/04/2020 1540   MCH 29.3 12/23/2022 0513   MCHC 32.1 12/23/2022 0513   RDW 16.8 (H)  12/23/2022 0513   RDW 16.2 (H) 01/04/2020 1540   LYMPHSABS 0.9 12/21/2022 1040   LYMPHSABS 1.0 01/04/2020 1540   MONOABS 1.3 (H) 12/21/2022 1040   EOSABS 0.4 12/21/2022 1040   EOSABS 0.5 (H) 03/27/2018 1643   BASOSABS 0.1 12/21/2022 1040   BASOSABS 0.0 03/27/2018 1643    BMET    Component Value Date/Time   NA 137 12/23/2022 0513   NA 141 06/25/2022 1414   K 4.4 12/23/2022 0513   CL 98 12/23/2022 0513   CO2 22 12/23/2022 0513   GLUCOSE 82 12/23/2022 0513   BUN 63 (H) 12/23/2022 0513   BUN 39 (H) 06/25/2022 1414   CREATININE 7.72 (H) 12/23/2022 0513   CREATININE 4.62 (H) 01/10/2019 1351   CALCIUM 6.7 (L) 12/23/2022 0513   GFRNONAA 7 (L) 12/23/2022 0513   GFRAA 15 (L) 01/04/2020 1542    INR    Component Value Date/Time   INR 1.3 (H) 12/21/2022 1040     Intake/Output Summary (Last 24 hours) at 12/23/2022 1147 Last data filed at 12/23/2022 0710 Gross per 24 hour  Intake 925.17 ml  Output --  Net 925.17 ml     Assessment/Plan:  67 y.o. male who presents to [facility] emergency department with right lower extremity ischemia and pain.  2 Days Post-Op   PLAN: Vascular surgery plans on taking the patient  to the vascular lab today for right lower extremity angiogram with possible intervention.  This morning again I discussed in detail with the patient the procedure, benefits, risk, and complications.  Patient verbalizes understanding and wishes to proceed as soon as possible.  Patient will go to hemodialysis this morning prior to procedure.  Patient has been n.p.o. since midnight last night.  I discussed the plan in detail with Dr. [name] MD and he is in agreement with the plan.  DVT prophylaxis:  Heparin Infusion    Marcie Bal [facility] 12/23/2022 11:47 AM

## 2022-12-23 NOTE — Progress Notes (Addendum)
[facility]  ROUNDING NOTE   Subjective:   Anthony Hess is a 68 year old male with past medical conditions including hypertension, PVD, dyslipidemia, CAD, four-vessel CABG, and end-stage renal disease on hemodialysis. Patient presents for outpatient angiogram with vascular surgery and was found to have abnormal labs. He is bring admitted for Limb ischemia [I99.8]  Patient is known to our practice and receives outpatient dialysis at [facility] on a TTS schedule, followed by Dr Cherylann Ratel.    Patient seen and evaluated during dialysis   HEMODIALYSIS FLOWSHEET:  Blood Flow Rate (mL/min): 350 mL/min Arterial Pressure (mmHg): -209.89 mmHg Venous Pressure (mmHg): 151.1 mmHg TMP (mmHg): -2.22 mmHg Ultrafiltration Rate (mL/min): 370 mL/min Dialysate Flow Rate (mL/min): 300 ml/min  Tolerating treatment well   Objective:  Vital signs in last 24 hours:  Temp:  [97.4 F (36.3 C)-98.4 F (36.9 C)] 98.4 F (36.9 C) (10/31 0751) Pulse Rate:  [12-76] 62 (10/31 1000) Resp:  [10-22] 11 (10/31 1000) BP: (80-158)/(55-110) 137/95 (10/31 1000) SpO2:  [91 %-96 %] 96 % (10/31 1000) Weight:  [51.5 kg] 51.5 kg (10/31 0751)  Weight change:  Filed Weights   12/21/22 1445 12/21/22 1900 12/23/22 0751  Weight: 52.9 kg 51.5 kg 51.5 kg    Intake/Output: I/O last 3 completed shifts: In: 1250.7 [P.O.:960; I.V.:290.7] Out: -    Intake/Output this shift:  Total I/O In: 164.5 [I.V.:164.5] Out: -   Physical Exam: General: NAD  Head: Normocephalic, atraumatic. Moist oral mucosal membranes  Eyes: Anicteric  Neck: Supple  Lungs:  Clear to auscultation, normal effort, room air  Heart: Regular rate and rhythm  Abdomen:  Soft, nontender  Extremities:  No peripheral edema.  Neurologic: Alert and oriented, moving all four extremities  Skin: No lesions  Access: Rt permcath    Basic Metabolic Panel: Recent Labs  Lab 12/21/22 1040 12/22/22 0243 12/23/22 0513  NA 134* 134* 137  K 5.5*  4.2 4.4  CL 95* 96* 98  CO2 20* 24 22  GLUCOSE 80 86 82  BUN 62* 34* 63*  CREATININE 8.31* 4.89* 7.72*  CALCIUM 6.5* 7.2* 6.7*    Liver Function Tests: Recent Labs  Lab 12/21/22 1040 12/22/22 0243  AST 58* 39  ALT 20 19  ALKPHOS 68 62  BILITOT 0.7 0.8  PROT 6.7 7.2  ALBUMIN 3.2* 4.0   No results for input(s): "LIPASE", "AMYLASE" in the last 168 hours. No results for input(s): "AMMONIA" in the last 168 hours.  CBC: Recent Labs  Lab 12/21/22 1040 12/22/22 0243 12/23/22 0513  WBC 9.8 7.7 7.9  NEUTROABS 7.1  --   --   HGB 12.2* 13.1 12.9*  HCT 37.7* 41.2 40.2  MCV 90.8 91.8 91.2  PLT 292 284 308    Cardiac Enzymes: No results for input(s): "CKTOTAL", "CKMB", "CKMBINDEX", "TROPONINI" in the last 168 hours.  BNP: Invalid input(s): "POCBNP"  CBG: Recent Labs  Lab 12/21/22 1231 12/21/22 1248  GLUCAP 62* 74    Microbiology: Results for orders placed or performed during the hospital encounter of 12/21/22  MRSA Next Gen by PCR, Nasal     Status: None   Collection Time: 12/21/22 12:38 PM   Specimen: Nasal Mucosa; Nasal Swab  Result Value Ref Range Status   MRSA by PCR Next Gen NOT DETECTED NOT DETECTED Final    Comment: (NOTE) The GeneXpert MRSA Assay (FDA approved for NASAL specimens only), is one component of a comprehensive MRSA colonization surveillance program. It is not intended to diagnose MRSA infection nor  to guide or monitor treatment for MRSA infections. Test performance is not FDA approved in patients less than 28 years old. Performed at [facility], [address] [place], [place] [address]     Coagulation Studies: Recent Labs    12/21/22 1040  LABPROT 15.9*  INR 1.3*    Urinalysis: No results for input(s): "COLORURINE", "LABSPEC", "PHURINE", "GLUCOSEU", "HGBUR", "BILIRUBINUR", "KETONESUR", "PROTEINUR", "UROBILINOGEN", "NITRITE", "LEUKOCYTESUR" in the last 72 hours.  Invalid input(s): "APPERANCEUR"    Imaging: No  results found.   Medications:    anticoagulant sodium citrate     heparin 1,250 Units/hr (12/23/22 0739)    aspirin EC  81 mg Oral Daily   calcium acetate  1,334 mg Oral TID WC   Chlorhexidine Gluconate Cloth  6 each Topical Q0600   midodrine  10 mg Oral TID WC   sevelamer carbonate  1,600 mg Oral TID WC   sodium chloride flush  10 mL Intravenous Q12H   alteplase, anticoagulant sodium citrate, feeding supplement (NEPRO CARB STEADY), heparin, lidocaine (PF), lidocaine-prilocaine, ondansetron **OR** ondansetron (ZOFRAN) IV, oxyCODONE-acetaminophen, pentafluoroprop-tetrafluoroeth  Assessment/ Plan:  [name] is a 67 y.o.  male with past medical conditions including hypertension, PVD, dyslipidemia, CAD, four-vessel CABG, and end-stage renal disease on hemodialysis. Patient presents for outpatient angiogram with vascular surgery and was found to have abnormal labs. He is bring admitted for Limb ischemia [I99.8]   End stage renal disease with hyperkalemia on hemodialysis. Potassium corrected. Receiving dialysis with UF goal 1L as tolerated. Next treatment scheduled for Saturday.   2. Anemia of chronic kidney disease Lab Results  Component Value Date   HGB 12.9 (L) 12/23/2022    Hgb at goal. No need for ESA  3. Secondary Hyperparathyroidism: with outpatient labs: None available  Lab Results  Component Value Date   PTH 170 (H) 06/25/2022   CALCIUM 6.7 (L) 12/23/2022   CAION 0.82 (LL) 10/13/2022   PHOS 9.4 (H) 11/17/2022    Hypocalcemia noted. Will receive some calcium supplementation with dialysis. Sevelamer and calcium acetate ordered with meals.   4. Hypotension with history of hypertension with chronic kidney disease. Home regimen includes amlodipine and isosorbide. Both held. Receiving Midodrine.    LOS: 2 Nyeshia Mysliwiec 10/31/202410:31 AM

## 2022-12-23 NOTE — Consult Note (Signed)
PHARMACY - ANTICOAGULATION CONSULT NOTE  Pharmacy Consult for Heparin Continuous Infusion  Indication:  limb ischemia  No Known Allergies  Patient Measurements: Height: 5\' 3"  (160 cm) Weight: 51.5 kg (113 lb 8.6 oz) IBW/kg (Calculated) : 56.9 Heparin Dosing Weight: 53.3 kg  Vital Signs: Temp: 98 F (36.7 C) (10/31 0300) Temp Source: Oral (10/31 0300) BP: 129/90 (10/31 0300) Pulse Rate: 59 (10/31 0300)  Labs: Recent Labs    12/21/22 1040 12/21/22 1917 12/22/22 0243 12/22/22 1231 12/22/22 2146 12/23/22 0513  HGB 12.2*  --  13.1  --   --  12.9*  HCT 37.7*  --  41.2  --   --  40.2  PLT 292  --  284  --   --  308  APTT 36  --   --   --   --   --   LABPROT 15.9*  --   --   --   --   --   INR 1.3*  --   --   --   --   --   HEPARINUNFRC <0.10*   < > 0.16* 0.12* 0.36 0.30  CREATININE 8.31*  --  4.89*  --   --   --    < > = values in this interval not displayed.   Estimated Creatinine Clearance: 11.1 mL/min (A) (by C-G formula based on SCr of 4.89 mg/dL (H)).  Medical History: Past Medical History:  Diagnosis Date   Aortic atherosclerosis (HCC)    Bilateral carotid artery disease (HCC)    Bladder cancer (HCC)    Coronary artery disease 12/20/2018   a.) LHC 12/20/2018: 50% OM1, 40% OM2, 95% o-pLAD, 75% o=pLCx, 40% mLM, 70% D1, 60% mRCA-1, 50% mRCA-2; refer to CVTS. b.) 4v CABG [facility] on 12/27/2018: LIMA-LAD, RIMA-PDA, seg LRA-OM1-D1   DCM (dilated cardiomyopathy) (HCC) 12/05/2018   a.) TTE 12/05/2018: EF 40-45%. b.) TTE 12/28/2019: EF 20-25%.   Dyspnea 10/15/2022   ESRD (end stage renal disease) (HCC)    a.) T-Th-Sat   HFrEF (heart failure with reduced ejection fraction) (HCC) 12/05/2018   a.) TTE 12/05/2018: EF 40-45%; mild LVH; ant/apical/sep HK; mild TR . b.) TTE 12/28/2019: EF 20-25%; mod LVH; mod MR/AR; G1DD.   History of 2019 novel coronavirus disease (COVID-19) 04/08/2021   History of kidney stones    HLD (hyperlipidemia)    Hx of CABG 12/27/2018   Hypertension     Infrarenal abdominal aortic aneurysm (AAA) without rupture (HCC) 03/05/2021   a.) CT abd/pelvis; measured 3.2 cm.   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 Last dose: some time last week (has been not taking d/t planned RLE angio)  Assessment: 67 year old patient who had planned RLE angiography with vascular today. However, patient was found to be hyperkalemic prior to the procedure(K: 5.4). Per vascular lab, procedure will not be done today, patient will be admitted until hyperkalemia is resolved. Was taking apixaban PTA but held dose for multiple days in anticipation of procedure.  Baseline labs: aPTT 36 sec, HL<0.10, INR 1.3, Plts 292, Hgb 12.2  Goal of Therapy:  Heparin level 0.3-0.7 units/ml Heparin level  66-102 units/ml Monitor platelets by anticoagulation protocol: Yes   Labs: 10/29 1917 HL 0.4, therapeutic x 1 10/30 0243 HL 0.16, subtherapeutic  10/30 1231 HL 0.12, subtherapeutic  10/30 2146 HL 0.36, therapeutic x 1 10/31 0513 HL 0.30, therapeutic x 2  Plan:  HL is therapeutic Continue heparin infusion at 1250 units/hr Recheck HL at 1800 to reconfirm, then daily Continue to monitor H&H and platelets  Otelia Sergeant, PharmD, [facility] 12/23/2022 6:21 AM

## 2022-12-23 NOTE — Interval H&P Note (Signed)
History and Physical Interval Note:  12/23/2022 2:33 PM  Anthony Hess  has presented today for surgery, with the diagnosis of Ischemia.  The various methods of treatment have been discussed with the patient and family. After consideration of risks, benefits and other options for treatment, the patient has consented to  Procedure(s): Lower Extremity Angiography (Right) as a surgical intervention.  The patient's history has been reviewed, patient examined, no change in status, stable for surgery.  I have reviewed the patient's chart and labs.  Questions were answered to the patient's satisfaction.     [name]

## 2022-12-23 NOTE — Op Note (Signed)
[facility]  Percutaneous Study/Intervention Procedural Note   Date of Surgery: 12/23/2022  Surgeon(s):Rahn Lacuesta    Assistants:none  Pre-operative Diagnosis: PAD with rest pain RLE  Post-operative diagnosis:  Same  Procedure(s) Performed:             1.  Ultrasound guidance for vascular access left femoral artery             2.  Catheter placement into right common femoral artery from left femoral approach             3.  Aortogram and selective right lower extremity angiogram             4.  Percutaneous transluminal angioplasty of right posterior tibial artery and tibioperoneal trunk with 2 mm diameter by 30 cm length angioplasty balloon             5.  Percutaneous transluminal angioplasty of the right SFA and popliteal arteries with 5 mm diameter by 30 cm length angioplasty balloon  6.  Stent placement to the right SFA and popliteal arteries with 6 mm diameter by 25 cm length and 6 mm diameter by 15 cm length Viabahn stents             7.  StarClose closure device left femoral artery  EBL: 10 cc  Contrast: 70 cc  Fluoro Time: 10.4 minutes  Moderate Conscious Sedation Time: approximately 56 minutes using 2 mg of Versed and 75 mcg of Fentanyl              Indications:  Patient is a 67 y.o.male with rest pain of the right lower extremity for the last couple of weeks. The patient has noninvasive study showing densely no blood flow distally in the right lower extremity. The patient is brought in for angiography for further evaluation and potential treatment.  Due to the limb threatening nature of the situation, angiogram was performed for attempted limb salvage. The patient is aware that if the procedure fails, amputation would be expected.  The patient also understands that even with successful revascularization, amputation may still be required due to the severity of the situation.  Risks and benefits are discussed and informed consent is obtained.    Procedure:  The patient was identified and appropriate procedural time out was performed.  The patient was then placed supine on the table and prepped and draped in the usual sterile fashion. Moderate conscious sedation was administered during a face to face encounter with the patient throughout the procedure with my supervision of the RN administering medicines and monitoring the patient's vital signs, pulse oximetry, telemetry and mental status throughout from the start of the procedure until the patient was taken to the recovery room. Ultrasound was used to evaluate the left common femoral artery.  It was patent .  A digital ultrasound image was acquired.  A Seldinger needle was used to access the left common femoral artery under direct ultrasound guidance and a permanent image was performed.  A 0.035 J wire was advanced without resistance and a 5Fr sheath was placed.  Pigtail catheter was placed into the aorta and an AP aortogram was performed. This demonstrated minimal flow and the renal arteries and calcified aorta and iliac arteries without any obvious stenosis although image quality was poor due to bowel gas and continuous patient motion. I then crossed the aortic bifurcation and advanced to the right femoral head. Selective right lower extremity angiogram was then performed. This demonstrated flush occlusion  of the right SFA with reconstitution of the diseased distal SFA with high-grade stenosis in the proximal popliteal artery as well.  The below-knee popliteal artery was fairly normal but then there was very severe tibial disease with a short segment of the anterior tibial artery was patent but then occluded and did not reconstitute distally.  The posterior tibial artery was small and occluded in the midsegment and did not reconstitute distally.  The peroneal artery was the only vessel with any distal flow but was occluded in the proximal to mid segment.  There are extensive collaterals in the lower  leg providing sluggish flow to the foot.  Of note, the image quality was exceptionally poor due to the continuous patient motion throughout the procedure and many pictures had to be taken to get any information about his flow. It was felt that it was in the patient's best interest to proceed with intervention after these images to avoid a second procedure and a larger amount of contrast and fluoroscopy based off of the findings from the initial angiogram. The patient was systemically heparinized and a 6 [place] Ansell sheath was then placed over the [employer] wire. I then used a Kumpe catheter and the advantage wire to easily navigate through the SFA and popliteal occlusion and confirm intraluminal flow in the below-knee popliteal artery.  I then tediously attempted to get across the peroneal occlusion but between the very severe disease and the continuous patient motion I was never able to cross the peroneal artery and get into the intraluminal peroneal artery distally.  I was able to get into the posterior tibial artery which addressed the occlusion in the tibioperoneal trunk and was able to angioplasty this to the mid to distal segment with a 2 mm diameter by 30 cm length angioplasty balloon taken up through the proximal posterior tibial artery and tibioperoneal trunk.  Completion imaging still showed extremely sluggish flow distally when shot from above.  I then addressed the long SFA and popliteal occlusion.  A 5 mm diameter by 30 cm length Lutonix drug-coated angioplasty balloon was inflated twice in the right SFA and popliteal arteries to address the entirety of the disease.  Each inflation was 10 atm for a minute.  Completion imaging showed continued residual near occlusive stenosis so I elected to place stents.  A 6 mm diameter by 25 cm length Viabahn stent and a 6 mm diameter by 15 cm length Viabahn stent were then deployed from the origin of the SFA down to the proximal to mid popliteal artery.   These were postdilated with 5 mm diameter Lutonix drug-coated angioplasty balloons.  His SFA and popliteal arteries now had less than 10% residual stenosis, but there remained extremely sluggish flow distally because of his tibial occlusive disease.  By this time, the patient had almost turned sideways and was in danger of falling off the bed and had also developed a hematoma from his access site due to continuous motion and noncompliance despite sedation.  There was clearly nothing further we can do safely today. I elected to terminate the procedure. The sheath was removed and StarClose closure device was deployed in the left femoral artery with excellent hemostatic result. The patient was taken to the recovery room in stable condition having tolerated the procedure well.  Findings:               Aortogram:  This demonstrated minimal flow and the renal arteries and calcified aorta and iliac arteries without any obvious  stenosis although image quality was poor due to bowel gas and continuous patient motion.             Right lower Extremity:  This demonstrated flush occlusion of the right SFA with reconstitution of the diseased distal SFA with high-grade stenosis in the proximal popliteal artery as well.  The below-knee popliteal artery was fairly normal but then there was very severe tibial disease with a short segment of the anterior tibial artery was patent but then occluded and did not reconstitute distally.  The posterior tibial artery was small and occluded in the midsegment and did not reconstitute distally.  The peroneal artery was the only vessel with any distal flow but was occluded in the proximal to mid segment.  There are extensive collaterals in the lower leg providing sluggish flow to the foot.  Of note, the image quality was exceptionally poor due to the continuous patient motion throughout the procedure and many pictures had to be taken to get any information about his flow.   Disposition:  Patient was taken to the recovery room in stable condition having tolerated the procedure well.  Complications: None  [name] 12/23/2022 4:39 PM   This note was created with Dragon Medical transcription system. Any errors in dictation are purely unintentional.

## 2022-12-24 ENCOUNTER — Encounter: Payer: Self-pay | Admitting: Vascular Surgery

## 2022-12-24 DIAGNOSIS — I998 Other disorder of circulatory system: Secondary | ICD-10-CM | POA: Diagnosis not present

## 2022-12-24 LAB — BASIC METABOLIC PANEL
Anion gap: 15 (ref 5–15)
BUN: 30 mg/dL — ABNORMAL HIGH (ref 8–23)
CO2: 22 mmol/L (ref 22–32)
Calcium: 7.2 mg/dL — ABNORMAL LOW (ref 8.9–10.3)
Chloride: 96 mmol/L — ABNORMAL LOW (ref 98–111)
Creatinine, Ser: 5.28 mg/dL — ABNORMAL HIGH (ref 0.61–1.24)
GFR, Estimated: 11 mL/min — ABNORMAL LOW (ref >60)
Glucose, Bld: 87 mg/dL (ref 70–99)
Potassium: 4.2 mmol/L (ref 3.5–5.1)
Sodium: 133 mmol/L — ABNORMAL LOW (ref 135–145)

## 2022-12-24 LAB — CBC
HCT: 40.9 % (ref 39.0–52.0)
Hemoglobin: 13 g/dL (ref 13.0–17.0)
MCH: 29.7 pg (ref 26.0–34.0)
MCHC: 31.8 g/dL (ref 30.0–36.0)
MCV: 93.4 fL (ref 80.0–100.0)
Platelets: 333 10*3/uL (ref 150–400)
RBC: 4.38 MIL/uL (ref 4.22–5.81)
RDW: 16.7 % — ABNORMAL HIGH (ref 11.5–15.5)
WBC: 11.8 10*3/uL — ABNORMAL HIGH (ref 4.0–10.5)
nRBC: 0 % (ref 0.0–0.2)

## 2022-12-24 LAB — GLUCOSE, CAPILLARY: Glucose-Capillary: 89 mg/dL (ref 70–99)

## 2022-12-24 LAB — HEPARIN LEVEL (UNFRACTIONATED): Heparin Unfractionated: 0.24 [IU]/mL — ABNORMAL LOW (ref 0.30–0.70)

## 2022-12-24 MED ORDER — ATORVASTATIN CALCIUM 20 MG PO TABS: 20.0000 mg | ORAL_TABLET | Freq: Every day | ORAL | 11 refills | Status: DC

## 2022-12-24 MED ORDER — HEPARIN BOLUS VIA INFUSION
800.0000 [IU] | Freq: Once | INTRAVENOUS | Status: AC
Start: 2022-12-24 — End: 2022-12-24
  Administered 2022-12-24: 800 [IU] via INTRAVENOUS
  Filled 2022-12-24: qty 800

## 2022-12-24 MED ORDER — APIXABAN 5 MG PO TABS
5.0000 mg | ORAL_TABLET | Freq: Two times a day (BID) | ORAL | Status: DC
  Administered 2022-12-24 – 2022-12-26 (×5): 5 mg via ORAL
  Filled 2022-12-24 (×5): qty 1

## 2022-12-24 NOTE — Plan of Care (Signed)
  Problem: Education: Goal: Knowledge of General Education information will improve Description: Including pain rating scale, medication(s)/side effects and non-pharmacologic comfort measures Outcome: Adequate for Discharge   Problem: Health Behavior/Discharge Planning: Goal: Ability to manage health-related needs will improve Outcome: Adequate for Discharge   Problem: Clinical Measurements: Goal: Ability to maintain clinical measurements within normal limits will improve Outcome: Adequate for Discharge Goal: Will remain free from infection Outcome: Adequate for Discharge Goal: Diagnostic test results will improve Outcome: Adequate for Discharge Goal: Respiratory complications will improve Outcome: Adequate for Discharge Goal: Cardiovascular complication will be avoided Outcome: Adequate for Discharge   Problem: Activity: Goal: Risk for activity intolerance will decrease Outcome: Adequate for Discharge   Problem: Nutrition: Goal: Adequate nutrition will be maintained Outcome: Adequate for Discharge   Problem: Coping: Goal: Level of anxiety will decrease Outcome: Adequate for Discharge   Problem: Elimination: Goal: Will not experience complications related to bowel motility Outcome: Adequate for Discharge Goal: Will not experience complications related to urinary retention Outcome: Adequate for Discharge   Problem: Pain Management: Goal: General experience of comfort will improve Outcome: Adequate for Discharge   Problem: Safety: Goal: Ability to remain free from injury will improve Outcome: Adequate for Discharge   Problem: Skin Integrity: Goal: Risk for impaired skin integrity will decrease Outcome: Adequate for Discharge   Problem: Education: Goal: Understanding of CV disease, CV risk reduction, and recovery process will improve Outcome: Adequate for Discharge Goal: Individualized Educational Video(s) Outcome: Adequate for Discharge   Problem:  Activity: Goal: Ability to return to baseline activity level will improve Outcome: Adequate for Discharge   Problem: Cardiovascular: Goal: Ability to achieve and maintain adequate cardiovascular perfusion will improve Outcome: Adequate for Discharge Goal: Vascular access site(s) Level 0-1 will be maintained Outcome: Adequate for Discharge   Problem: Health Behavior/Discharge Planning: Goal: Ability to safely manage health-related needs after discharge will improve Outcome: Adequate for Discharge

## 2022-12-24 NOTE — Addendum Note (Signed)
Addended by: Debera Lat on: 12/24/2022 08:54 AM   Modules accepted: Orders

## 2022-12-24 NOTE — Progress Notes (Signed)
PROGRESS NOTE    Anthony Hess  ZOX:[id number] DOB: 14-Jul-1955 DOA: 12/21/2022 PCP: Ronnald Ramp, MD   Assessment & Plan:   Principal Problem:   Limb ischemia Active Problems:   Hyperkalemia   (HFpEF) heart failure with preserved ejection fraction (HCC)   End-stage renal disease on hemodialysis (HCC)   Essential hypertension   Atrial fibrillation, chronic (HCC)   Tobacco dependence   CAD (coronary artery disease)  Assessment and Plan: Right limb ischemia: w/ worsening b/l limb pain & weakness. D/c IV heparin and restart eliquis, aspirin & statin as per vasc surg. S/p angiogram w/ angioplasty & stent placement on 12/23/22 as per vasc surg. Pt is unable to ambulate due to significant pain. PT/OT consulted. Oxy, morphine prn    Chronic diastolic CHF: echo July 2024 EF of 60 to 65% and grade I diastolic dysfunction. Volume management w/ HD. Nephro following and recs apprec    Hyperkalemia: resolved   HTN: holding home dose of amlodipine as BP is on low end of normal    ESRD: on HD TTS. Nephro following and recs apprec    Tobacco dependence: smokes 1 ppd. Received smoking cessation counseling already   Chronic a. fib: restart eliquis. D/c IV heparin   Hx of CAD: s/p of CABG. No active CP. Restart aspirin. Start statin        DVT prophylaxis: eliquis  Code Status: full  Family Communication:  Disposition Plan: depends on PT/OT recs   Level of care: Telemetry Medical Status is: Inpatient Remains inpatient appropriate because: severity of illness, unable to ambulate secondary to significant leg pain     Consultants:  Vasc surg  Nephro   Procedures:   Antimicrobials:    Subjective: Pt c/o right leg pain   Objective: Vitals:   12/24/22 0900 12/24/22 1041 12/24/22 1100 12/24/22 1413  BP: (!) 152/84 122/67 (!) 141/91 (!) 142/92  Pulse:      Resp: 16 17 (!) 23 20  Temp:      TempSrc:      SpO2:      Weight:      Height:        Intake/Output  Summary (Last 24 hours) at 12/24/2022 1503 Last data filed at 12/24/2022 0916 Gross per 24 hour  Intake 508.8 ml  Output --  Net 508.8 ml   Filed Weights   12/23/22 0751 12/23/22 1152 12/23/22 1424  Weight: 51.5 kg 51 kg 51 kg    Examination:  General exam: appears calm & comfortable  Respiratory system: decreased breath sounds b/l  Cardiovascular system: S1 & S2+. No rubs or clicks  Gastrointestinal system: abd is soft, NT, ND & hypoactive bowel sounds  Central nervous system: alert & oriented.  Psychiatry: judgement and insight appears at baseline. Flat mood and affect    Data Reviewed: I have personally reviewed following labs and imaging studies  CBC: Recent Labs  Lab 12/21/22 1040 12/22/22 0243 12/23/22 0513 12/24/22 0124  WBC 9.8 7.7 7.9 11.8*  NEUTROABS 7.1  --   --   --   HGB 12.2* 13.1 12.9* 13.0  HCT 37.7* 41.2 40.2 40.9  MCV 90.8 91.8 91.2 93.4  PLT 292 284 308 333   Basic Metabolic Panel: Recent Labs  Lab 12/21/22 1040 12/22/22 0243 12/23/22 0513 12/24/22 0124  NA 134* 134* 137 133*  K 5.5* 4.2 4.4 4.2  CL 95* 96* 98 96*  CO2 20* 24 22 22   GLUCOSE 80 86 82 87  BUN 62* 34* 63*  30*  CREATININE 8.31* 4.89* 7.72* 5.28*  CALCIUM 6.5* 7.2* 6.7* 7.2*   GFR: Estimated Creatinine Clearance: 10.2 mL/min (A) (by C-G formula based on SCr of 5.28 mg/dL (H)). Liver Function Tests: Recent Labs  Lab 12/21/22 1040 12/22/22 0243  AST 58* 39  ALT 20 19  ALKPHOS 68 62  BILITOT 0.7 0.8  PROT 6.7 7.2  ALBUMIN 3.2* 4.0   No results for input(s): "LIPASE", "AMYLASE" in the last 168 hours. No results for input(s): "AMMONIA" in the last 168 hours. Coagulation Profile: Recent Labs  Lab 12/21/22 1040  INR 1.3*   Cardiac Enzymes: No results for input(s): "CKTOTAL", "CKMB", "CKMBINDEX", "TROPONINI" in the last 168 hours. BNP (last 3 results) No results for input(s): "PROBNP" in the last 8760 hours. HbA1C: No results for input(s): "HGBA1C" in the last 72  hours. CBG: Recent Labs  Lab 12/21/22 1231 12/21/22 1248 12/24/22 0807  GLUCAP 62* 74 89   Lipid Profile: No results for input(s): "CHOL", "HDL", "LDLCALC", "TRIG", "CHOLHDL", "LDLDIRECT" in the last 72 hours. Thyroid Function Tests: No results for input(s): "TSH", "T4TOTAL", "FREET4", "T3FREE", "THYROIDAB" in the last 72 hours. Anemia Panel: No results for input(s): "VITAMINB12", "FOLATE", "FERRITIN", "TIBC", "IRON", "RETICCTPCT" in the last 72 hours. Sepsis Labs: No results for input(s): "PROCALCITON", "LATICACIDVEN" in the last 168 hours.  Recent Results (from the past 240 hour(s))  MRSA Next Gen by PCR, Nasal     Status: None   Collection Time: 12/21/22 12:38 PM   Specimen: Nasal Mucosa; Nasal Swab  Result Value Ref Range Status   MRSA by PCR Next Gen NOT DETECTED NOT DETECTED Final    Comment: (NOTE) The GeneXpert MRSA Assay (FDA approved for NASAL specimens only), is one component of a comprehensive MRSA colonization surveillance program. It is not intended to diagnose MRSA infection nor to guide or monitor treatment for MRSA infections. Test performance is not FDA approved in patients less than 41 years old. Performed at [facility], [address], [place], [place] [address]          Radiology Studies: PERIPHERAL VASCULAR CATHETERIZATION  Result Date: 12/23/2022 See surgical note for result.       Scheduled Meds:  apixaban  5 mg Oral BID   aspirin EC  81 mg Oral Daily   atorvastatin  20 mg Oral Daily   calcium acetate  1,334 mg Oral TID WC   Chlorhexidine Gluconate Cloth  6 each Topical Q0600   midodrine  10 mg Oral TID WC   sevelamer carbonate  1,600 mg Oral TID WC   sodium chloride flush  10 mL Intravenous Q12H   Continuous Infusions:     LOS: 3 days      Charise Killian, MD [facility] Pager [phone]  If 7PM-7AM, please contact night-coverage [email or web address]  12/24/2022, 3:03 PM

## 2022-12-24 NOTE — Discharge Summary (Signed)
[facility] VASCULAR & VEIN SPECIALISTS    Discharge Summary    Patient ID:  Anthony Hess MRN: [id number] DOB/AGE: 1955-05-06 67 y.o.  Admit date: 12/21/2022 Discharge date: 12/24/2022 Date of Surgery: 12/23/2022 Surgeon: Surgeon(s): Wyn Quaker Marlow Baars, MD  Admission Diagnosis: Limb ischemia [I99.8]  Discharge Diagnoses:  Limb ischemia [I99.8]  Secondary Diagnoses: Past Medical History:  Diagnosis Date   Aortic atherosclerosis (HCC)    Bilateral carotid artery disease (HCC)    Bladder cancer (HCC)    Coronary artery disease 12/20/2018   a.) LHC 12/20/2018: 50% OM1, 40% OM2, 95% o-pLAD, 75% o=pLCx, 40% mLM, 70% D1, 60% mRCA-1, 50% mRCA-2; refer to CVTS. b.) 4v CABG at [facility] on 12/27/2018: LIMA-LAD, RIMA-PDA, seg LRA-OM1-D1   DCM (dilated cardiomyopathy) (HCC) 12/05/2018   a.) TTE 12/05/2018: EF 40-45%. b.) TTE 12/28/2019: EF 20-25%.   Dyspnea 10/15/2022   ESRD (end stage renal disease) (HCC)    a.) T-Th-Sat   HFrEF (heart failure with reduced ejection fraction) (HCC) 12/05/2018   a.) TTE 12/05/2018: EF 40-45%; mild LVH; ant/apical/sep HK; mild TR . b.) TTE 12/28/2019: EF 20-25%; mod LVH; mod MR/AR; G1DD.   History of 2019 novel coronavirus disease (COVID-19) 04/08/2021   History of kidney stones    HLD (hyperlipidemia)    Hx of CABG 12/27/2018   Hypertension    Infrarenal abdominal aortic aneurysm (AAA) without rupture (HCC) 03/05/2021   a.) CT abd/pelvis; measured 3.2 cm.   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  Patient is resting comfortably in ICU this morning.  Patient reports right lower extremity is warmer and feels much better today.  He denies any pain.  Patient is ambulating around the unit without difficulties.  Patient is eating and voiding appropriately.  Patient is recovering as expected.  Patient to be discharged today.  Patient will be going home on aspirin 81 mg daily, Eliquis 5 mg twice daily, and Lipitor 20 mg daily.  Patient does not need dual anticoagulation platelet therapy as he is covered by Eliquis 5 mg.  Hospital Course:  [name] is a 67 y.o. male is S/P Right lower extremity angiogram with angioplasty and Stent placement  Extubated: POD # 0 Physical Exam:  Alert notes x3, no acute distress Face: Symmetrical.  Tongue is midline. Neck: Trachea is midline.  No swelling or bruising. Cardiovascular: Regular rate and rhythm Pulmonary: Clear to auscultation bilaterally Abdomen: Soft, nontender, nondistended Right groin access: Clean dry and intact.  No swelling or drainage noted Left groin access: Clean dry and intact.  No swelling or drainage noted Left lower extremity: Thigh soft.  Calf soft.  Extremities warm distally toes.  Hard to palpate pedal pulses however the foot is warm is her good capillary refill. Right lower extremity: Thigh soft.  Calf soft.  Extremities warm distally toes.  Hard to palpate pedal pulses however the foot is warm is her good capillary refill. Neurological: No deficits noted   Post-op wounds:  clean, dry, intact or healing  well  Pt. Ambulating, voiding and taking PO diet without difficulty. Pt pain controlled with PO pain meds.  Labs:  As below  Complications: none  Consults:  Treatment Team:  Renford Dills, MD  Significant Diagnostic Studies: CBC Lab Results  Component Value Date   WBC 11.8 (H) 12/24/2022   HGB 13.0 12/24/2022   HCT 40.9 12/24/2022   MCV 93.4 12/24/2022   PLT 333  12/24/2022    BMET    Component Value Date/Time   NA 133 (L) 12/24/2022 0124   NA 141 06/25/2022 1414   K 4.2 12/24/2022 0124   CL 96 (L) 12/24/2022 0124   CO2 22 12/24/2022 0124   GLUCOSE 87 12/24/2022 0124   BUN 30 (H) 12/24/2022 0124   BUN 39 (H) 06/25/2022 1414   CREATININE 5.28 (H) 12/24/2022 0124   CREATININE 4.62 (H) 01/10/2019 1351   CALCIUM 7.2 (L) 12/24/2022 0124   GFRNONAA 11 (L) 12/24/2022 0124   GFRAA 15 (L) 01/04/2020 1542   COAG Lab Results  Component Value Date   INR 1.3 (H) 12/21/2022   INR 1.1 10/15/2022   INR 1.1 09/14/2022     Disposition:  Discharge to :Home  Allergies as of 12/24/2022   No Known Allergies      Medication List     TAKE these medications    amLODipine 2.5 MG tablet Commonly known as: NORVASC Take 1 tablet (2.5 mg total) by mouth daily.   apixaban 5 MG Tabs tablet Commonly known as: ELIQUIS Take 1 tablet (5 mg total) by mouth 2 (two) times daily.   aspirin EC 81 MG tablet Take 81 mg by mouth daily.   atorvastatin 20 MG tablet Commonly known as: LIPITOR Take 1 tablet (20 mg total) by mouth daily. Start taking on: December 25, 2022   calcium acetate 667 MG capsule Commonly known as: PHOSLO Take 1 capsule (667 mg total) by mouth 3 (three) times daily with meals.   isosorbide dinitrate 10 MG tablet Commonly known as: ISORDIL Take 1 tablet (10 mg total) by mouth 3 (three) times daily.   oxyCODONE-acetaminophen 5-325 MG tablet Commonly known as: PERCOCET/ROXICET Take 1 tablet by mouth every 4 (four) hours as needed for severe pain (pain score 7-10).   pantoprazole 40 MG tablet Commonly known as: Protonix Take 1 tablet (40 mg total) by mouth daily.   sevelamer carbonate 800 MG tablet Commonly known as: RENVELA Take 1,600 mg by mouth 3 (three) times daily.       Verbal and written Discharge instructions given to the patient. Wound care per Discharge AVS  Follow-up Information     Georgiana Spinner, NP Follow  up in 2 week(s).   Specialty: Vascular Surgery Why: Right lower extremity Ultrasound with ABI's Contact information: [address] [place] [address] [phone]                 Signed: Marcie Bal, NP  12/24/2022, 9:56 AM

## 2022-12-24 NOTE — Progress Notes (Signed)
[facility]  Daily Progress Note   Subjective  -   Feels better.  Says foot is warm and not hurting as much. No access site issues. No events overnight  Objective Vitals:   12/24/22 0300 12/24/22 0400 12/24/22 0500 12/24/22 0600  BP: 134/89 (!) 180/103 (!) 152/102 (!) 144/117  Pulse:   62   Resp: 11 16 14 13   Temp:  98.4 F (36.9 C)    TempSrc:  Oral    SpO2:   95%   Weight:      Height:        Intake/Output Summary (Last 24 hours) at 12/24/2022 0751 Last data filed at 12/24/2022 0700 Gross per 24 hour  Intake 477.24 ml  Output 400 ml  Net 77.24 ml    PULM  CTAB CV  RRR VASC  Right foot is warm, good capillary refill. Pulses not palpable.  Access site is C/D/I without hematoma  Laboratory CBC    Component Value Date/Time   WBC 11.8 (H) 12/24/2022 0124   HGB 13.0 12/24/2022 0124   HGB 9.1 (L) 01/04/2020 1540   HCT 40.9 12/24/2022 0124   HCT 29.6 (L) 01/04/2020 1540   PLT 333 12/24/2022 0124   PLT 191 01/04/2020 1540    BMET    Component Value Date/Time   NA 133 (L) 12/24/2022 0124   NA 141 06/25/2022 1414   K 4.2 12/24/2022 0124   CL 96 (L) 12/24/2022 0124   CO2 22 12/24/2022 0124   GLUCOSE 87 12/24/2022 0124   BUN 30 (H) 12/24/2022 0124   BUN 39 (H) 06/25/2022 1414   CREATININE 5.28 (H) 12/24/2022 0124   CREATININE 4.62 (H) 01/10/2019 1351   CALCIUM 7.2 (L) 12/24/2022 0124   GFRNONAA 11 (L) 12/24/2022 0124   GFRAA 15 (L) 01/04/2020 1542    Assessment/Planning: POD #1 s/p RLE revascularization  Doing well this am Foot warmer, feels better to patient No palpable pedal pulses, but has severe tibial disease. OK to switch to Eliquis and D/C heparin Ambulate, likely can go home later today   [name]  12/24/2022, 7:51 AM

## 2022-12-24 NOTE — Progress Notes (Signed)
Patient stated pain was 2. Refused pain medication.

## 2022-12-24 NOTE — Plan of Care (Signed)
  Problem: Activity: Goal: Risk for activity intolerance will decrease 12/24/2022 1416 by Cathren Laine, RN Outcome: Not Progressing   Patient able to stand at bedside only.  Patient unable to take a step or walk due to feeling unsteady (even with walker) and severe pain.  Dr. Mayford Knife notified.

## 2022-12-24 NOTE — Consult Note (Signed)
PHARMACY - ANTICOAGULATION CONSULT NOTE  Pharmacy Consult for Heparin Continuous Infusion  Indication:  limb ischemia  No Known Allergies  Patient Measurements: Height: 5\' 3"  (160 cm) Weight: 51 kg (112 lb 7 oz) IBW/kg (Calculated) : 56.9 Heparin Dosing Weight: 53.3 kg  Vital Signs: Temp: 98 F (36.7 C) (11/01 0000) Temp Source: Oral (11/01 0000) BP: 143/97 (11/01 0100) Pulse Rate: 77 (10/31 2300)  Labs: Recent Labs    12/21/22 1040 12/21/22 1917 12/22/22 0243 12/22/22 1231 12/22/22 2146 12/23/22 0513 12/24/22 0124  HGB 12.2*  --  13.1  --   --  12.9* 13.0  HCT 37.7*  --  41.2  --   --  40.2 40.9  PLT 292  --  284  --   --  308 333  APTT 36  --   --   --   --   --   --   LABPROT 15.9*  --   --   --   --   --   --   INR 1.3*  --   --   --   --   --   --   HEPARINUNFRC <0.10*   < > 0.16*   < > 0.36 0.30 0.24*  CREATININE 8.31*  --  4.89*  --   --  7.72* 5.28*   < > = values in this interval not displayed.   Estimated Creatinine Clearance: 10.2 mL/min (A) (by C-G formula based on SCr of 5.28 mg/dL (H)).  Medical History: Past Medical History:  Diagnosis Date   Aortic atherosclerosis (HCC)    Bilateral carotid artery disease (HCC)    Bladder cancer (HCC)    Coronary artery disease 12/20/2018   a.) LHC 12/20/2018: 50% OM1, 40% OM2, 95% o-pLAD, 75% o=pLCx, 40% mLM, 70% D1, 60% mRCA-1, 50% mRCA-2; refer to CVTS. b.) 4v CABG at [facility] on 12/27/2018: LIMA-LAD, RIMA-PDA, seg LRA-OM1-D1   DCM (dilated cardiomyopathy) (HCC) 12/05/2018   a.) TTE 12/05/2018: EF 40-45%. b.) TTE 12/28/2019: EF 20-25%.   Dyspnea 10/15/2022   ESRD (end stage renal disease) (HCC)    a.) T-Th-Sat   HFrEF (heart failure with reduced ejection fraction) (HCC) 12/05/2018   a.) TTE 12/05/2018: EF 40-45%; mild LVH; ant/apical/sep HK; mild TR . b.) TTE 12/28/2019: EF 20-25%; mod LVH; mod MR/AR; G1DD.   History of 2019 novel coronavirus disease (COVID-19) 04/08/2021   History of kidney stones    HLD  (hyperlipidemia)    Hx of CABG 12/27/2018   Hypertension    Infrarenal abdominal aortic aneurysm (AAA) without rupture (HCC) 03/05/2021   a.) CT abd/pelvis; measured 3.2 cm.   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 Last dose: some time last week (has been not taking d/t planned RLE angio)  Assessment: 67 year old patient who had planned RLE angiography with vascular today. However, patient was found to be hyperkalemic prior to the procedure(K: 5.4). Per vascular lab, procedure will not be done today, patient will be admitted until hyperkalemia is resolved. Was taking apixaban PTA but held dose for multiple days in anticipation of procedure.  Baseline labs: aPTT 36 sec, HL<0.10,  INR 1.3, Plts 292, Hgb 12.2  Goal of Therapy:  Heparin level 0.3-0.7 units/ml Heparin level 66-102 units/ml Monitor platelets by anticoagulation protocol: Yes   Labs: 10/29 1917 HL 0.4, therapeutic x 1 10/30 0243 HL 0.16, subtherapeutic  10/30 1231 HL 0.12, subtherapeutic  10/30 2146 HL 0.36, therapeutic x 1 10/31 0513 HL 0.30, therapeutic x 2 11/01 0124 HL 0.24, subtherapeutic  Plan:  Bolus 800 units x 1 Increase heparin infusion to 1400 units/hr Check Anti-Xa level in 8 hours after rate change Continue to monitor H&H and platelets  Otelia Sergeant, PharmD, [facility] 12/24/2022 3:13 AM

## 2022-12-24 NOTE — Progress Notes (Signed)
[facility]  ROUNDING NOTE   Subjective:   Anthony Hess is a 67 year old male with past medical conditions including hypertension, PVD, dyslipidemia, CAD, four-vessel CABG, and end-stage renal disease on hemodialysis. Patient presents for outpatient angiogram with vascular surgery and was found to have abnormal labs. He is bring admitted for Limb ischemia [I99.8]  Patient is known to our practice and receives outpatient dialysis at [facility] on a TTS schedule, followed by Dr Cherylann Ratel.    Patient sitting up in bed Eating breakfast Mild discomfort from RLE Room air Reports possible d/c later today   Objective:  Vital signs in last 24 hours:  Temp:  [98 F (36.7 C)-98.9 F (37.2 C)] 98.4 F (36.9 C) (11/01 0400) Pulse Rate:  [0-96] 64 (11/01 0756) Resp:  [11-23] 23 (11/01 1100) BP: (118-208)/(67-136) 141/91 (11/01 1100) SpO2:  [71 %-97 %] 96 % (11/01 0756) Weight:  [51 kg] 51 kg (10/31 1424)  Weight change:  Filed Weights   12/23/22 0751 12/23/22 1152 12/23/22 1424  Weight: 51.5 kg 51 kg 51 kg    Intake/Output: I/O last 3 completed shifts: In: 651.7 [I.V.:651.7] Out: 400 [Other:400]   Intake/Output this shift:  Total I/O In: 31.6 [I.V.:31.6] Out: -   Physical Exam: General: NAD  Head: Normocephalic, atraumatic. Moist oral mucosal membranes  Eyes: Anicteric  Neck: Supple  Lungs:  Clear to auscultation, normal effort, room air  Heart: Regular rate and rhythm  Abdomen:  Soft, nontender  Extremities:  No peripheral edema.  Neurologic: Alert and oriented, moving all four extremities  Skin: No lesions, RLE warm  Access: Rt permcath    Basic Metabolic Panel: Recent Labs  Lab 12/21/22 1040 12/22/22 0243 12/23/22 0513 12/24/22 0124  NA 134* 134* 137 133*  K 5.5* 4.2 4.4 4.2  CL 95* 96* 98 96*  CO2 20* 24 22 22   GLUCOSE 80 86 82 87  BUN 62* 34* 63* 30*  CREATININE 8.31* 4.89* 7.72* 5.28*  CALCIUM 6.5* 7.2* 6.7* 7.2*    Liver Function  Tests: Recent Labs  Lab 12/21/22 1040 12/22/22 0243  AST 58* 39  ALT 20 19  ALKPHOS 68 62  BILITOT 0.7 0.8  PROT 6.7 7.2  ALBUMIN 3.2* 4.0   No results for input(s): "LIPASE", "AMYLASE" in the last 168 hours. No results for input(s): "AMMONIA" in the last 168 hours.  CBC: Recent Labs  Lab 12/21/22 1040 12/22/22 0243 12/23/22 0513 12/24/22 0124  WBC 9.8 7.7 7.9 11.8*  NEUTROABS 7.1  --   --   --   HGB 12.2* 13.1 12.9* 13.0  HCT 37.7* 41.2 40.2 40.9  MCV 90.8 91.8 91.2 93.4  PLT 292 284 308 333    Cardiac Enzymes: No results for input(s): "CKTOTAL", "CKMB", "CKMBINDEX", "TROPONINI" in the last 168 hours.  BNP: Invalid input(s): "POCBNP"  CBG: Recent Labs  Lab 12/21/22 1231 12/21/22 1248 12/24/22 0807  GLUCAP 62* 74 89    Microbiology: Results for orders placed or performed during the hospital encounter of 12/21/22  MRSA Next Gen by PCR, Nasal     Status: None   Collection Time: 12/21/22 12:38 PM   Specimen: Nasal Mucosa; Nasal Swab  Result Value Ref Range Status   MRSA by PCR Next Gen NOT DETECTED NOT DETECTED Final    Comment: (NOTE) The GeneXpert MRSA Assay (FDA approved for NASAL specimens only), is one component of a comprehensive MRSA colonization surveillance program. It is not intended to diagnose MRSA infection nor to guide or monitor  treatment for MRSA infections. Test performance is not FDA approved in patients less than 29 years old. Performed at [facility], [address] [place], [place] [address]     Coagulation Studies: No results for input(s): "LABPROT", "INR" in the last 72 hours.   Urinalysis: No results for input(s): "COLORURINE", "LABSPEC", "PHURINE", "GLUCOSEU", "HGBUR", "BILIRUBINUR", "KETONESUR", "PROTEINUR", "UROBILINOGEN", "NITRITE", "LEUKOCYTESUR" in the last 72 hours.  Invalid input(s): "APPERANCEUR"    Imaging: PERIPHERAL VASCULAR CATHETERIZATION  Result Date: 12/23/2022 See surgical note for result.     Medications:      apixaban  5 mg Oral BID   aspirin EC  81 mg Oral Daily   atorvastatin  20 mg Oral Daily   calcium acetate  1,334 mg Oral TID WC   Chlorhexidine Gluconate Cloth  6 each Topical Q0600   midodrine  10 mg Oral TID WC   sevelamer carbonate  1,600 mg Oral TID WC   sodium chloride flush  10 mL Intravenous Q12H   hydrALAZINE, ondansetron **OR** ondansetron (ZOFRAN) IV, oxyCODONE-acetaminophen  Assessment/ Plan:  [name] is a 67 y.o.  male with past medical conditions including hypertension, PVD, dyslipidemia, CAD, four-vessel CABG, and end-stage renal disease on hemodialysis. Patient presents for outpatient angiogram with vascular surgery and was found to have abnormal labs. He is bring admitted for Limb ischemia [I99.8]   End stage renal disease with hyperkalemia on hemodialysis. Potassium corrected. Dialysis received yesterday, UF achieved. Next treatment scheduled for Saturday.   2. Anemia of chronic kidney disease Lab Results  Component Value Date   HGB 13.0 12/24/2022    Hgb above desired goal. No need for ESA  3. Secondary Hyperparathyroidism: with outpatient labs: None available  Lab Results  Component Value Date   PTH 170 (H) 06/25/2022   CALCIUM 7.2 (L) 12/24/2022   CAION 0.82 (LL) 10/13/2022   PHOS 9.4 (H) 11/17/2022    Hypocalcemia noted. Calcium slightly improved.  Sevelamer and calcium acetate ordered with meals.   4. Hypotension with history of hypertension with chronic kidney disease. Home regimen includes amlodipine and isosorbide. Both held. Receiving Midodrine. Blood pressure 141/91   LOS: 3 Anthony Hess 11/1/202411:09 AM

## 2022-12-24 NOTE — Progress Notes (Signed)
Patient transferred to room 224 after reported given to Seymour, Charity fundraiser. Called and left voicemail for Eleanor, wife and patient stated that he told her that he was transferring out of the unit. Reviewed medications, assessment details and plan of care with receiving RN. Transferred patient with phone, ring on finger and all personal belongings. Placed on bedside monitor and reviewed vitals. Will transfer care at this time.

## 2022-12-24 NOTE — Plan of Care (Signed)

## 2022-12-24 NOTE — TOC Progression Note (Signed)
Transition of Care [facility]) - Progression Note    Patient Details  Name: Anthony Hess MRN: [id number] Date of Birth: 1955/07/11  Transition of Care [facility]) CM/SW Contact  Garret Reddish, RN Phone Number: 12/24/2022, 2:56 PM  Clinical Narrative:     Received message from staff nurse that patient will need a cab ticket home today.  I have provided staff nurse with cab ticket for ride home today.           Expected Discharge Plan and Services         Expected Discharge Date: 12/24/22                                     Social Determinants of Health (SDOH) Interventions SDOH Screenings   Food Insecurity: No Food Insecurity (12/21/2022)  Recent Concern: Food Insecurity - Food Insecurity Present (11/16/2022)  Housing: Low Risk  (12/21/2022)  Recent Concern: Housing - Medium Risk (11/16/2022)  Transportation Needs: No Transportation Needs (12/21/2022)  Recent Concern: Transportation Needs - Unmet Transportation Needs (11/16/2022)  Utilities: Not At Risk (12/21/2022)  Recent Concern: Utilities - At Risk (11/16/2022)  Depression (PHQ2-9): Medium Risk (12/14/2022)  Financial Resource Strain: Low Risk  (08/23/2022)  Physical Activity: Inactive (08/23/2022)  Social Connections: Moderately Integrated (08/23/2022)  Stress: No Stress Concern Present (08/23/2022)  Tobacco Use: High Risk (12/23/2022)    Readmission Risk Interventions    11/17/2022    9:38 AM 11/16/2022    1:01 PM 09/17/2022   10:26 AM  Readmission Risk Prevention Plan  Transportation Screening Complete Complete Complete  Medication Review Oceanographer) Complete Not Complete Complete  Med Review Comments  will complete with Rn upon discharge   PCP or Specialist appointment within 3-5 days of discharge Complete Not Complete Complete  PCP/Specialist Appt Not Complete comments pcp dr. simmons-robins SNF placement may be needed   HRI or Home Care Consult  Not Complete Complete  SW Recovery Care/Counseling Consult   Not Complete Complete  SW Consult Not Complete Comments n/a    Palliative Care Screening Not Applicable Not Applicable Not Applicable  Skilled Nursing Facility Not Applicable Not Complete Complete  SNF Comments not applicable Pt waiting PT and OT evaluation

## 2022-12-25 DIAGNOSIS — I998 Other disorder of circulatory system: Secondary | ICD-10-CM | POA: Diagnosis not present

## 2022-12-25 LAB — CBC
HCT: 41.2 % (ref 39.0–52.0)
Hemoglobin: 13.3 g/dL (ref 13.0–17.0)
MCH: 29.2 pg (ref 26.0–34.0)
MCHC: 32.3 g/dL (ref 30.0–36.0)
MCV: 90.5 fL (ref 80.0–100.0)
Platelets: 303 10*3/uL (ref 150–400)
RBC: 4.55 MIL/uL (ref 4.22–5.81)
RDW: 16.6 % — ABNORMAL HIGH (ref 11.5–15.5)
WBC: 14.6 10*3/uL — ABNORMAL HIGH (ref 4.0–10.5)
nRBC: 0 % (ref 0.0–0.2)

## 2022-12-25 LAB — BASIC METABOLIC PANEL
Anion gap: 19 — ABNORMAL HIGH (ref 5–15)
BUN: 51 mg/dL — ABNORMAL HIGH (ref 8–23)
CO2: 19 mmol/L — ABNORMAL LOW (ref 22–32)
Calcium: 7.3 mg/dL — ABNORMAL LOW (ref 8.9–10.3)
Chloride: 99 mmol/L (ref 98–111)
Creatinine, Ser: 7.18 mg/dL — ABNORMAL HIGH (ref 0.61–1.24)
GFR, Estimated: 8 mL/min — ABNORMAL LOW (ref >60)
Glucose, Bld: 80 mg/dL (ref 70–99)
Potassium: 5.2 mmol/L — ABNORMAL HIGH (ref 3.5–5.1)
Sodium: 137 mmol/L (ref 135–145)

## 2022-12-25 MED ORDER — LIDOCAINE-PRILOCAINE 2.5-2.5 % EX CREA: 1.0000 | TOPICAL_CREAM | CUTANEOUS | Status: DC | PRN

## 2022-12-25 MED ORDER — LIDOCAINE HCL (PF) 1 % IJ SOLN: 5.0000 mL | INTRAMUSCULAR | Status: DC | PRN

## 2022-12-25 MED ORDER — OXYCODONE-ACETAMINOPHEN 5-325 MG PO TABS
ORAL_TABLET | ORAL | Status: AC
  Filled 2022-12-25: qty 1

## 2022-12-25 MED ORDER — ALTEPLASE 2 MG IJ SOLR: 2.0000 mg | Freq: Once | INTRAMUSCULAR | Status: DC | PRN

## 2022-12-25 MED ORDER — PENTAFLUOROPROP-TETRAFLUOROETH EX AERO: 1.0000 | INHALATION_SPRAY | CUTANEOUS | Status: DC | PRN

## 2022-12-25 MED ORDER — HEPARIN SODIUM (PORCINE) 1000 UNIT/ML DIALYSIS
1000.0000 [IU] | INTRAMUSCULAR | Status: DC | PRN
  Administered 2022-12-25: 1000 [IU]

## 2022-12-25 MED ORDER — MIDODRINE HCL 5 MG PO TABS
ORAL_TABLET | ORAL | Status: AC
  Filled 2022-12-25: qty 2

## 2022-12-25 MED ORDER — ANTICOAGULANT SODIUM CITRATE 4% (200MG/5ML) IV SOLN: 5.0000 mL | Status: DC | PRN

## 2022-12-25 NOTE — Progress Notes (Addendum)
[facility]IN [facility] PROGRESS NOTE  ASSESSMENT / PLAN: Anthony Hess is a 67 y.o. male status post right lower extremity  endovascular intervention for rest pain 12/23/22. Patient's right calf is tender but soft. No motor function in R foot. No sensation in R foot. Continue medical therapy for peripheral arterial disease.   SUBJECTIVE: Right foot feels numb, but not painful. Says he cannot move the R foot, but this is not new. Right calf is tender to touch.  OBJECTIVE: BP (!) 146/102 (BP Location: Left Arm)   Pulse 72   Temp 98.7 F (37.1 C) (Oral)   Resp 12   Ht 5\' 3"  (1.6 m)   Wt 51.5 kg   SpO2 93%   BMI 20.11 kg/m   Intake/Output Summary (Last 24 hours) at 12/25/2022 1126 Last data filed at 12/25/2022 0413 Gross per 24 hour  Intake 240 ml  Output --  Net 240 ml    Constitutional: chronically ill, cachectic man in no acute distress. Foot is pink. Good capillary refill. No doppler available on unit. No motor function in foot. R calf is tender, but soft.      Latest Ref Rng & Units 12/25/2022    4:40 AM 12/24/2022    1:24 AM 12/23/2022    5:13 AM  CBC  WBC 4.0 - 10.5 K/uL 14.6  11.8  7.9   Hemoglobin 13.0 - 17.0 g/dL 16.1  09.6  04.5   Hematocrit 39.0 - 52.0 % 41.2  40.9  40.2   Platelets 150 - 400 K/uL 303  333  308         Latest Ref Rng & Units 12/25/2022    4:40 AM 12/24/2022    1:24 AM 12/23/2022    5:13 AM  CMP  Glucose 70 - 99 mg/dL 80  87  82   BUN 8 - 23 mg/dL 51  30  63   Creatinine 0.61 - 1.24 mg/dL 4.09  8.11  9.14   Sodium 135 - 145 mmol/L 137  133  137   Potassium 3.5 - 5.1 mmol/L 5.2  4.2  4.4   Chloride 98 - 111 mmol/L 99  96  98   CO2 22 - 32 mmol/L 19  22  22    Calcium 8.9 - 10.3 mg/dL 7.3  7.2  6.7     Estimated Creatinine Clearance: 7.6 mL/min (A) (by C-G formula based on SCr of 7.18 mg/dL (H)).  Rande Brunt. Lenell Antu, MD FACS [facility] Office Phone Number: [phone] 12/25/2022 11:26 AM

## 2022-12-25 NOTE — Evaluation (Signed)
Physical Therapy Evaluation Patient Details Name: Anthony Hess MRN: [id number] DOB: September 17, 1955 Today's Date: 12/25/2022  History of Present Illness  Pt is a 67 y.o. male with medical history significant of ESRD on HD TTS, CAD, status post CABG, paroxysmal atrial fibrillation, history of pulmonary embolism, tobacco use disorder, PVD, hyperlipidemia, long history of medical nonadherence presenting with limb ischemia and hyperkalemia. Pt noted to have been evaluated by vascular surgery planning angiogram today. Has been reporting bilateral limb pain and weakness especially with walking. Noted baseline peripheral vascular disease. Vascular surgery and plan for angiogram to assess for any current peripheral vascular occlusion; PAD on 12/23/22.  Clinical Impression  Pt is received in bed, he is sleeping with easily awakes and agreeable to PT session. At baseline, Pt reports amb without use of AD, independent with ADL/IADLs, and recent difficulty with walking due to R leg pain. Pt performs bed mobility minA due to RLE pain; unable to further assess mobility at this time secondary to pain limitation. PT attempted assist with RLE management to encourage sitting EOB but Pt endorses 7-8/10 NPS on RLE specifically in hamstring and gastroc region. Assessed light and dull touch on BLE with no impairments noted. Educated Pt on benefits of OOB mobility to return to PLOF-Pt verbalized understanding. Pt would benefit from cont skilled PT to address above deficits and promote optimal return to PLOF.        If plan is discharge home, recommend the following: A lot of help with walking and/or transfers;Help with stairs or ramp for entrance;Supervision due to cognitive status   Can travel by private vehicle   Yes    Equipment Recommendations Other (comment) (TBD at next facility)  Recommendations for Other Services       Functional Status Assessment Patient has had a recent decline in their functional status and  demonstrates the ability to make significant improvements in function in a reasonable and predictable amount of time.     Precautions / Restrictions Precautions Precautions: Fall Precaution Comments: RLE hypersensitivity Restrictions Weight Bearing Restrictions: No      Mobility  Bed Mobility Overal bed mobility: Needs Assistance Bed Mobility: Rolling, Sidelying to Sit Rolling: Modified independent (Device/Increase time) Sidelying to sit: Min assist, Used rails       General bed mobility comments: Pt performs bed mobility mod I-minA but unable to successfully sit EOB secondary to increase pain in RLE during assist    Transfers                   General transfer comment: Deferred at this time secondary to increased RLE pain    Ambulation/Gait               General Gait Details: Deferred at this time secondary to increased RLE pain  Stairs            Wheelchair Mobility     Tilt Bed    Modified Rankin (Stroke Patients Only)       Balance                                             Pertinent Vitals/Pain Pain Assessment Pain Assessment: 0-10 Pain Score: 8  Pain Location: RLE Pain Descriptors / Indicators: Sharp Pain Intervention(s): Limited activity within patient's tolerance, Monitored during session    Home Living Family/patient expects to be discharged to:: Private residence Living  Arrangements: Spouse/significant other Available Help at Discharge: Family;Available 24 hours/day Type of Home: House Home Access: Stairs to enter Entrance Stairs-Rails: Right;Left;Can reach both Entrance Stairs-Number of Steps: 3 steps to enter, 6 steps in the back Alternate Level Stairs-Number of Steps: 1 flight Home Layout: Two level Home Equipment: None      Prior Function Prior Level of Function : Independent/Modified Independent;Driving             Mobility Comments: no AD use for amb ADLs Comments: Independent with  ADL/IADLs     Extremity/Trunk Assessment   Upper Extremity Assessment Upper Extremity Assessment: Overall WFL for tasks assessed    Lower Extremity Assessment Lower Extremity Assessment: Generalized weakness;RLE deficits/detail RLE Deficits / Details: hypersensitive during mobility; Pt report increased sharp pain in hamstrings and gastroc area RLE: Unable to fully assess due to pain RLE Sensation: WNL RLE Coordination: decreased gross motor       Communication   Communication Communication: No apparent difficulties Cueing Techniques: Verbal cues  Cognition Arousal: Lethargic Behavior During Therapy: WFL for tasks assessed/performed Overall Cognitive Status: No family/caregiver present to determine baseline cognitive functioning                                 General Comments: Ao x4 but occasionally mumbles words and falls asleep        General Comments General comments (skin integrity, edema, etc.): Unable to assess at this time secondary to increased RLE pain during bed mobility and Pt refusing to sit EOB    Exercises     Assessment/Plan    PT Assessment Patient needs continued PT services  PT Problem List Decreased strength;Decreased mobility;Decreased range of motion;Decreased activity tolerance;Decreased balance;Decreased coordination;Pain       PT Treatment Interventions DME instruction;Therapeutic exercise;Gait training;Stair training;Functional mobility training;Therapeutic activities;Balance training    PT Goals (Current goals can be found in the Care Plan section)  Acute Rehab PT Goals Patient Stated Goal: To reduce right leg pain PT Goal Formulation: With patient Time For Goal Achievement: 01/08/23 Potential to Achieve Goals: Fair    Frequency Min 1X/week     Co-evaluation               AM-PAC PT "6 Clicks" Mobility  Outcome Measure Help needed turning from your back to your side while in a flat bed without using bedrails?: A  Little Help needed moving from lying on your back to sitting on the side of a flat bed without using bedrails?: A Little Help needed moving to and from a bed to a chair (including a wheelchair)?: A Lot Help needed standing up from a chair using your arms (e.g., wheelchair or bedside chair)?: A Lot Help needed to walk in hospital room?: A Lot Help needed climbing 3-5 steps with a railing? : A Lot 6 Click Score: 14    End of Session   Activity Tolerance: Patient limited by pain Patient left: in bed;with call bell/phone within reach;with bed alarm set Nurse Communication: Mobility status PT Visit Diagnosis: Unsteadiness on feet (R26.81);Muscle weakness (generalized) (M62.81);Pain Pain - Right/Left: Right Pain - part of body: Leg    Time: 0981-1914 PT Time Calculation (min) (ACUTE ONLY): 17 min   Charges:                 Elmon Else, SPT   Ayame Rena 12/25/2022, 10:56 AM

## 2022-12-25 NOTE — Progress Notes (Signed)
PROGRESS NOTE    Anthony Hess  OVF:[id number] DOB: 01-11-1956 DOA: 12/21/2022 PCP: Ronnald Ramp, MD   Assessment & Plan:   Principal Problem:   Limb ischemia Active Problems:   Hyperkalemia   (HFpEF) heart failure with preserved ejection fraction (HCC)   End-stage renal disease on hemodialysis (HCC)   Essential hypertension   Atrial fibrillation, chronic (HCC)   Tobacco dependence   CAD (coronary artery disease)  Assessment and Plan: Right limb ischemia: w/ worsening b/l limb pain & weakness. Continue on aspirin, statin, eliquis as per vasc surg. S/p angiogram w/ angioplasty & stent placement on 12/23/22 as per vasc surg. Pt still c/o significant w/ ambulation. PT/OT recs SNF. Oxy, morphine prn for pain    Chronic diastolic CHF: echo July 2024 EF of 60 to 65% and grade I diastolic dysfunction. Volume management w/ HD. Nephro following and recs apprec    Hyperkalemia: will be managed w/ HD today   HTN: holding home dose of amlodipine as BP is on low end of normal    ESRD: on HD TTS. Nephro following and recs apprec    Tobacco dependence: smokes 1 ppd. Received smoking cessation counseling already   Chronic a. fib: continue on eliquis. D/c IV heparin   Hx of CAD: s/p of CABG. No active CP. Continue on aspirin, statin, eliquis       DVT prophylaxis: eliquis  Code Status: full  Family Communication:  Disposition Plan: possibly d/c to SNF if pt agrees  Level of care: Telemetry Medical Status is: Inpatient Remains inpatient appropriate because: needs SNF placement.     Consultants:  Vasc surg  Nephro   Procedures:   Antimicrobials:    Subjective: Pt c/o right leg pain   Objective: Vitals:   12/24/22 2145 12/25/22 0511 12/25/22 0514 12/25/22 0732  BP: (!) 176/118 (!) 200/120 (!) 200/120 (!) 131/96  Pulse: 69 77  90  Resp: 18 18  18   Temp: 99.1 F (37.3 C) 98.3 F (36.8 C)  98.5 F (36.9 C)  TempSrc: Oral Oral    SpO2: 100% 95%  95%   Weight:      Height:        Intake/Output Summary (Last 24 hours) at 12/25/2022 0826 Last data filed at 12/25/2022 0413 Gross per 24 hour  Intake 253.31 ml  Output --  Net 253.31 ml   Filed Weights   12/23/22 0751 12/23/22 1152 12/23/22 1424  Weight: 51.5 kg 51 kg 51 kg    Examination:  General exam: appears comfortable  Respiratory system: diminished breath sounds b/l  Cardiovascular system: S1/S2+. No rubs or clicks Gastrointestinal system: abd is soft, NT, ND & hypoactive bowel sounds  Central nervous system: alert and awake  Psychiatry: judgement and insight appears poor. Flat mood and affect    Data Reviewed: I have personally reviewed following labs and imaging studies  CBC: Recent Labs  Lab 12/21/22 1040 12/22/22 0243 12/23/22 0513 12/24/22 0124 12/25/22 0440  WBC 9.8 7.7 7.9 11.8* 14.6*  NEUTROABS 7.1  --   --   --   --   HGB 12.2* 13.1 12.9* 13.0 13.3  HCT 37.7* 41.2 40.2 40.9 41.2  MCV 90.8 91.8 91.2 93.4 90.5  PLT 292 284 308 333 303   Basic Metabolic Panel: Recent Labs  Lab 12/21/22 1040 12/22/22 0243 12/23/22 0513 12/24/22 0124 12/25/22 0440  NA 134* 134* 137 133* 137  K 5.5* 4.2 4.4 4.2 5.2*  CL 95* 96* 98 96* 99  CO2 20*  24 22 22  19*  GLUCOSE 80 86 82 87 80  BUN 62* 34* 63* 30* 51*  CREATININE 8.31* 4.89* 7.72* 5.28* 7.18*  CALCIUM 6.5* 7.2* 6.7* 7.2* 7.3*   GFR: Estimated Creatinine Clearance: 7.5 mL/min (A) (by C-G formula based on SCr of 7.18 mg/dL (H)). Liver Function Tests: Recent Labs  Lab 12/21/22 1040 12/22/22 0243  AST 58* 39  ALT 20 19  ALKPHOS 68 62  BILITOT 0.7 0.8  PROT 6.7 7.2  ALBUMIN 3.2* 4.0   No results for input(s): "LIPASE", "AMYLASE" in the last 168 hours. No results for input(s): "AMMONIA" in the last 168 hours. Coagulation Profile: Recent Labs  Lab 12/21/22 1040  INR 1.3*   Cardiac Enzymes: No results for input(s): "CKTOTAL", "CKMB", "CKMBINDEX", "TROPONINI" in the last 168 hours. BNP (last 3  results) No results for input(s): "PROBNP" in the last 8760 hours. HbA1C: No results for input(s): "HGBA1C" in the last 72 hours. CBG: Recent Labs  Lab 12/21/22 1231 12/21/22 1248 12/24/22 0807  GLUCAP 62* 74 89   Lipid Profile: No results for input(s): "CHOL", "HDL", "LDLCALC", "TRIG", "CHOLHDL", "LDLDIRECT" in the last 72 hours. Thyroid Function Tests: No results for input(s): "TSH", "T4TOTAL", "FREET4", "T3FREE", "THYROIDAB" in the last 72 hours. Anemia Panel: No results for input(s): "VITAMINB12", "FOLATE", "FERRITIN", "TIBC", "IRON", "RETICCTPCT" in the last 72 hours. Sepsis Labs: No results for input(s): "PROCALCITON", "LATICACIDVEN" in the last 168 hours.  Recent Results (from the past 240 hour(s))  MRSA Next Gen by PCR, Nasal     Status: None   Collection Time: 12/21/22 12:38 PM   Specimen: Nasal Mucosa; Nasal Swab  Result Value Ref Range Status   MRSA by PCR Next Gen NOT DETECTED NOT DETECTED Final    Comment: (NOTE) The GeneXpert MRSA Assay (FDA approved for NASAL specimens only), is one component of a comprehensive MRSA colonization surveillance program. It is not intended to diagnose MRSA infection nor to guide or monitor treatment for MRSA infections. Test performance is not FDA approved in patients less than 37 years old. Performed at [facility], [address], [place], [place] [address]          Radiology Studies: PERIPHERAL VASCULAR CATHETERIZATION  Result Date: 12/23/2022 See surgical note for result.       Scheduled Meds:  apixaban  5 mg Oral BID   aspirin EC  81 mg Oral Daily   atorvastatin  20 mg Oral Daily   calcium acetate  1,334 mg Oral TID WC   Chlorhexidine Gluconate Cloth  6 each Topical Q0600   midodrine  10 mg Oral TID WC   sevelamer carbonate  1,600 mg Oral TID WC   sodium chloride flush  10 mL Intravenous Q12H   Continuous Infusions:     LOS: 4 days      Charise Killian, MD [facility] Pager  [phone]  If 7PM-7AM, please contact night-coverage [email or web address]  12/25/2022, 8:26 AM

## 2022-12-25 NOTE — Progress Notes (Signed)
[facility]  PROGRESS NOTE   Subjective:   Patient seen on dialysis.  His blood pressure is stable.  Objective:  Vital signs: Blood pressure (!) 124/95, pulse 88, temperature 98.7 F (37.1 C), temperature source Oral, resp. rate 13, height 5\' 3"  (1.6 m), weight 51.5 kg, SpO2 96%.  Intake/Output Summary (Last 24 hours) at 12/25/2022 1203 Last data filed at 12/25/2022 0413 Gross per 24 hour  Intake 240 ml  Output --  Net 240 ml   Filed Weights   12/23/22 1152 12/23/22 1424 12/25/22 1043  Weight: 51 kg 51 kg 51.5 kg     Physical Exam: General:  No acute distress  Head:  Normocephalic, atraumatic. Moist oral mucosal membranes  Eyes:  Anicteric  Neck:  Supple  Lungs:   Clear to auscultation, normal effort  Heart:  S1S2 no rubs  Abdomen:   Soft, nontender, bowel sounds present  Extremities:  peripheral edema.  Neurologic:  Awake, alert, following commands  Skin:  No lesions  Access:     Basic Metabolic Panel: Recent Labs  Lab 12/21/22 1040 12/22/22 0243 12/23/22 0513 12/24/22 0124 12/25/22 0440  NA 134* 134* 137 133* 137  K 5.5* 4.2 4.4 4.2 5.2*  CL 95* 96* 98 96* 99  CO2 20* 24 22 22  19*  GLUCOSE 80 86 82 87 80  BUN 62* 34* 63* 30* 51*  CREATININE 8.31* 4.89* 7.72* 5.28* 7.18*  CALCIUM 6.5* 7.2* 6.7* 7.2* 7.3*   GFR: Estimated Creatinine Clearance: 7.6 mL/min (A) (by C-G formula based on SCr of 7.18 mg/dL (H)).  Liver Function Tests: Recent Labs  Lab 12/21/22 1040 12/22/22 0243  AST 58* 39  ALT 20 19  ALKPHOS 68 62  BILITOT 0.7 0.8  PROT 6.7 7.2  ALBUMIN 3.2* 4.0   No results for input(s): "LIPASE", "AMYLASE" in the last 168 hours. No results for input(s): "AMMONIA" in the last 168 hours.  CBC: Recent Labs  Lab 12/21/22 1040 12/22/22 0243 12/23/22 0513 12/24/22 0124 12/25/22 0440  WBC 9.8 7.7 7.9 11.8* 14.6*  NEUTROABS 7.1  --   --   --   --   HGB 12.2* 13.1 12.9* 13.0 13.3  HCT 37.7* 41.2 40.2 40.9 41.2  MCV 90.8 91.8 91.2  93.4 90.5  PLT 292 284 308 333 303     HbA1C: Hgb A1c MFr Bld  Date/Time Value Ref Range Status  06/25/2022 02:14 PM 4.8 4.8 - 5.6 % Final    Comment:             Prediabetes: 5.7 - 6.4          Diabetes: >6.4          Glycemic control for adults with diabetes: <7.0   12/27/2019 03:50 PM 5.6 4.8 - 5.6 % Final    Comment:    (NOTE) Pre diabetes:          5.7%-6.4%  Diabetes:              >6.4%  Glycemic control for   <7.0% adults with diabetes     Urinalysis: No results for input(s): "COLORURINE", "LABSPEC", "PHURINE", "GLUCOSEU", "HGBUR", "BILIRUBINUR", "KETONESUR", "PROTEINUR", "UROBILINOGEN", "NITRITE", "LEUKOCYTESUR" in the last 72 hours.  Invalid input(s): "APPERANCEUR"    Imaging: PERIPHERAL VASCULAR CATHETERIZATION  Result Date: 12/23/2022 See surgical note for result.    Medications:    anticoagulant sodium citrate      apixaban  5 mg Oral BID   aspirin EC  81 mg Oral Daily  atorvastatin  20 mg Oral Daily   calcium acetate  1,334 mg Oral TID WC   Chlorhexidine Gluconate Cloth  6 each Topical Q0600   midodrine  10 mg Oral TID WC   sevelamer carbonate  1,600 mg Oral TID WC   sodium chloride flush  10 mL Intravenous Q12H    Assessment/ Plan:     67 year old male with history of hypertension, coronary artery disease, congestive heart failure, atrial fibrillation, status post CABG, hyperlipidemia, peripheral vascular disease, end-stage renal disease on hemodialysis admitted for angiogram of the lower extremity.  #1: ESRD: Tolerating dialysis treatment.  Potassium level now improved.  #2: Anemia: Patient will continue anemia protocol as outpatient.  #3: Secondary hyperparathyroidism: We will check PTH, calcium and phosphorus levels.  Will continue sevelamer.  #4: Hypotension: Patient will continue to receive midodrine.  Medications reviewed. Continue supportive care.  Labs and medications reviewed. Will continue to follow along with you.   LOS:  4 Lorain Childes, MD [facility] 11/2/202412:03 PM

## 2022-12-25 NOTE — Progress Notes (Signed)
OT Cancellation Note  Patient Details Name: Anthony Hess MRN: [id number] DOB: Dec 25, 1955   Cancelled Treatment:    Reason Eval/Treat Not Completed: Patient at procedure or test/ unavailable;Pain limiting ability to participate. Chart reviewed. Pt familiar from recent admission. Pt currently off the floor for HD and per PT was unable to transfer 2/2 pain. Anticipate minimal/no OT needs but will initiate evaluation as pt available and pain controlled.   Kathie Dike, M.S. OTR/L  12/25/22, 11:09 AM  ascom [phone]

## 2022-12-25 NOTE — Progress Notes (Signed)
Hemodialysis note  Received patient in bed to unit. Alert and oriented.  Informed consent signed and in chart.  Treatment initiated: 1048 Treatment completed: 1422  Patient tolerated well. Transported back to room, alert without acute distress.  Report given to patient's RN.   Access used: Right Chest HD PermCath Access issues: none  Total UF removed: 2L Medication(s) given:  Oxycodone-Acetaminophen 5/325 mg tab PO, Midodrine 10 mg tab PO  Post HD weight: 50.5 kg   [facility]

## 2022-12-26 DIAGNOSIS — I998 Other disorder of circulatory system: Secondary | ICD-10-CM | POA: Diagnosis not present

## 2022-12-26 LAB — CBC
HCT: 42.3 % (ref 39.0–52.0)
Hemoglobin: 13.7 g/dL (ref 13.0–17.0)
MCH: 29 pg (ref 26.0–34.0)
MCHC: 32.4 g/dL (ref 30.0–36.0)
MCV: 89.6 fL (ref 80.0–100.0)
Platelets: 339 10*3/uL (ref 150–400)
RBC: 4.72 MIL/uL (ref 4.22–5.81)
RDW: 16.8 % — ABNORMAL HIGH (ref 11.5–15.5)
WBC: 15.9 10*3/uL — ABNORMAL HIGH (ref 4.0–10.5)
nRBC: 0 % (ref 0.0–0.2)

## 2022-12-26 LAB — BASIC METABOLIC PANEL
Anion gap: 19 — ABNORMAL HIGH (ref 5–15)
BUN: 43 mg/dL — ABNORMAL HIGH (ref 8–23)
CO2: 23 mmol/L (ref 22–32)
Calcium: 8 mg/dL — ABNORMAL LOW (ref 8.9–10.3)
Chloride: 96 mmol/L — ABNORMAL LOW (ref 98–111)
Creatinine, Ser: 5.7 mg/dL — ABNORMAL HIGH (ref 0.61–1.24)
GFR, Estimated: 10 mL/min — ABNORMAL LOW (ref >60)
Glucose, Bld: 103 mg/dL — ABNORMAL HIGH (ref 70–99)
Potassium: 4.9 mmol/L (ref 3.5–5.1)
Sodium: 138 mmol/L (ref 135–145)

## 2022-12-26 MED ORDER — OXYCODONE-ACETAMINOPHEN 5-325 MG PO TABS: 1.0000 | ORAL_TABLET | Freq: Three times a day (TID) | ORAL | 0 refills | Status: AC | PRN

## 2022-12-26 NOTE — Progress Notes (Signed)
[facility] PROGRESS NOTE  ASSESSMENT / PLAN: Anthony Hess is a 67 y.o. male status post right lower extremity  endovascular intervention for rest pain 12/23/22. Patient's right calf is tender but soft. No motor function in R foot. No sensation in R foot. This is unchanged. Continue medical therapy for peripheral arterial disease.   I think he is unlikely to do well at home, given ongoing issues with pain. I think a major amputation is his best option going forward. He is not willing to pursue this at this point. He can be discharged from my standpoint with close interval follow up. If he is discharged we will see him as an outpatient with close interval follow up.  SUBJECTIVE: Wants to go home. Seen with therapist. I reviewed my concerns about discharge home. Patient and wife want to go, and I said as long as they felt they could manage that was fine with me.  OBJECTIVE: BP 106/81 (BP Location: Left Arm)   Pulse 80   Temp 98.2 F (36.8 C) (Oral)   Resp 17   Ht 5\' 3"  (1.6 m)   Wt 50.5 kg   SpO2 96%   BMI 19.72 kg/m   Intake/Output Summary (Last 24 hours) at 12/26/2022 1209 Last data filed at 12/25/2022 1422 Gross per 24 hour  Intake --  Output 2000 ml  Net -2000 ml    Constitutional: chronically ill, cachectic man in no acute distress. Foot is pink. Good capillary refill. No doppler available on unit. No motor function in foot. R calf is tender, but soft.      Latest Ref Rng & Units 12/26/2022    4:55 AM 12/25/2022    4:40 AM 12/24/2022    1:24 AM  CBC  WBC 4.0 - 10.5 K/uL 15.9  14.6  11.8   Hemoglobin 13.0 - 17.0 g/dL 13.0  86.5  78.4   Hematocrit 39.0 - 52.0 % 42.3  41.2  40.9   Platelets 150 - 400 K/uL 339  303  333         Latest Ref Rng & Units 12/26/2022    4:55 AM 12/25/2022    4:40 AM 12/24/2022    1:24 AM  CMP  Glucose 70 - 99 mg/dL 696  80  87   BUN 8 - 23 mg/dL 43  51  30   Creatinine 0.61 - 1.24 mg/dL 2.95  2.84  1.32   Sodium 135 - 145  mmol/L 138  137  133   Potassium 3.5 - 5.1 mmol/L 4.9  5.2  4.2   Chloride 98 - 111 mmol/L 96  99  96   CO2 22 - 32 mmol/L 23  19  22    Calcium 8.9 - 10.3 mg/dL 8.0  7.3  7.2     Estimated Creatinine Clearance: 9.4 mL/min (A) (by C-G formula based on SCr of 5.7 mg/dL (H)).  Rande Brunt. Anthony Antu, MD FACS [facility] Office Phone Number: [phone] 12/26/2022 12:09 PM

## 2022-12-26 NOTE — Progress Notes (Signed)
    Durable Medical Equipment  (From admission, onward)           Start     Ordered   12/26/22 1141  For home use only DME lightweight manual wheelchair with seat cushion  Once       Comments: Patient suffers from generalized weakness which impairs their ability to perform daily activities like bathing, dressing, feeding, grooming, and toileting in the home.  A cane or walker will not resolve  issue with performing activities of daily living. A wheelchair will allow patient to safely perform daily activities. Patient is not able to propel themselves in the home using a standard weight wheelchair due to endurance and general weakness. Patient can self propel in the lightweight wheelchair. Length of need Lifetime. Accessories: elevating leg rests (ELRs), wheel locks, extensions and anti-tippers.   12/26/22 1140

## 2022-12-26 NOTE — TOC Transition Note (Signed)
Transition of Care [facility]) - CM/SW Discharge Note   Patient Details  Name: Anthony Hess MRN: [id number] Date of Birth: 1955/03/22  [facility] CM/SW Contact:  Luvenia Redden, RN Phone Number: 12/26/2022, 1:13 PM   Clinical Narrative:     Received request to arrange DME for wheelchair for discharge today. RN TOC spoke with pt for DME. Pt receptive to [employer] for the requested DME. Spoke with [facility] [employer]) who will order wheelchair for bedside delivery. Pt states his father-in-law will transport pt to his place of residence today at discharge. Pt states he has assistance from his spouse and father-in-law with any transport or needs at this time.  Team updated accordingly with no other request at this time.   Final next level of care: Home/Self Care Barriers to Discharge: No Barriers Identified   Patient Goals and CMS Choice   Choice offered to / list presented to : Patient  Discharge Placement                    Name of family member notified: Pt states his father-in-law will pick him up today. Patient and family notified of of transfer: 12/26/22  Discharge Plan and Services Additional resources added to the After Visit Summary for                  DME Arranged: Wheelchair manual DME Agency: [employer] Date DME Agency Contacted: 12/26/22 Time DME Agency Contacted: 1312 Representative spoke with at DME Agency: [facility]            Social Determinants of Health (SDOH) Interventions SDOH Screenings   Food Insecurity: No Food Insecurity (12/21/2022)  Recent Concern: Food Insecurity - Food Insecurity Present (11/16/2022)  Housing: Low Risk  (12/21/2022)  Recent Concern: Housing - Medium Risk (11/16/2022)  Transportation Needs: No Transportation Needs (12/21/2022)  Recent Concern: Transportation Needs - Unmet Transportation Needs (11/16/2022)  Utilities: Not At Risk (12/21/2022)  Recent Concern: Utilities - At Risk (11/16/2022)  Depression (PHQ2-9): Medium  Risk (12/14/2022)  Financial Resource Strain: Low Risk  (08/23/2022)  Physical Activity: Inactive (08/23/2022)  Social Connections: Moderately Integrated (08/23/2022)  Stress: No Stress Concern Present (08/23/2022)  Tobacco Use: High Risk (12/23/2022)     Readmission Risk Interventions    11/17/2022    9:38 AM 11/16/2022    1:01 PM 09/17/2022   10:26 AM  Readmission Risk Prevention Plan  Transportation Screening Complete Complete Complete  Medication Review Ocean[employer]) Complete Not Complete Complete  Med Review Comments  will complete with Rn upon discharge   PCP or Specialist appointment within 3-5 days of discharge Complete Not Complete Complete  PCP/Specialist Appt Not Complete comments pcp dr. simmons-robins SNF placement may be needed   HRI or Home Care Consult  Not Complete Complete  SW Recovery Care/Counseling Consult  Not Complete Complete  SW Consult Not Complete Comments n/a    Palliative Care Screening Not Applicable Not Applicable Not Applicable  Skilled Nursing Facility Not Applicable Not Complete Complete  SNF Comments not applicable Pt waiting PT and OT evaluation

## 2022-12-26 NOTE — Discharge Summary (Signed)
Physician Discharge Summary  Keagen Heinlen VVO:[id number] DOB: Feb 21, 1956 DOA: 12/21/2022  PCP: Ronnald Ramp, MD  Admit date: 12/21/2022 Discharge date: 12/26/2022  Admitted From: home  Disposition:  home   Recommendations for Outpatient Follow-up:  Follow up with PCP in 1-2 weeks F/u w/ vasc surg, NP Manson Passey, in 2 weeks   Home Health: Pt refused HH  Equipment/Devices: wheelchair   Discharge Condition: stable  CODE STATUS: full  Diet recommendation: Heart Healthy / renal diet    Brief/Interim Summary: HPI was taken from Dr. Alvester Morin: Anthony Hess is a 67 y.o. male with medical history significant of ESRD on HD TTS, CAD, status post CABG, paroxysmal atrial fibrillation, history of pulmonary embolism, tobacco use disorder, PVD, hyperlipidemia, long history of medical nonadherence presenting with limb ischemia and hyperkalemia.  Patient noted to have been evaluated by vascular surgery planning angiogram today.  Has been reporting bilateral limb pain and weakness especially with walking.  Noted baseline peripheral vascular disease.  Vascular surgery and plan for angiogram to assess for any current peripheral vascular occlusion.  Had lab draw today which showed a potassium of 5.4.  Patient reports getting hemodialysis yesterday prior to procedure.  Has been otherwise compliant with medication regimen per patient.  No fevers or chills.  No nausea or vomiting.  Still smoking roughly 1 pack/day.  No reported alcohol use.  Limb pain predominate over the right side. No reported trauma.  Presenting in the vascular recovery area afebrile, hemodynamically stable.  Potassium of 5.4.  Labs pending.  Discharge Diagnoses:  Principal Problem:   Limb ischemia Active Problems:   Hyperkalemia   (HFpEF) heart failure with preserved ejection fraction (HCC)   End-stage renal disease on hemodialysis (HCC)   Essential hypertension   Atrial fibrillation, chronic (HCC)   Tobacco dependence   CAD  (coronary artery disease)  Right limb ischemia: w/ worsening b/l limb pain & weakness. Continue on aspirin, statin, eliquis as per vasc surg. S/p angiogram w/ angioplasty & stent placement on 12/23/22 as per vasc surg. Pt still c/o significant w/ ambulation. PT/OT recs SNF but pt refused SNF & home health. Oxy, morphine prn for pain    Chronic diastolic CHF: echo July 2024 EF of 60 to 65% and grade I diastolic dysfunction. Volume management w/ HD. Nephro following and recs apprec    Hyperkalemia: will be managed w/ HD today   HTN: holding home dose of amlodipine as BP is on low end of normal    ESRD: on HD TTS. Nephro following and recs apprec    Tobacco dependence: smokes 1 ppd. Received smoking cessation counseling already   Chronic a. fib: continue on eliquis. D/c IV heparin   Hx of CAD: s/p of CABG. No active CP. Continue on aspirin, statin, eliquis   Discharge Instructions  Discharge Instructions     Diet - low sodium heart healthy   Complete by: As directed    W/ renal diet   Discharge instructions   Complete by: As directed    F/u w/ PCP in 1-2 weeks. F/u w/ vasc surg, NP Manson Passey, in 2 weeks. F/u w/ nephro, in 2-3 weeks   Increase activity slowly   Complete by: As directed       Allergies as of 12/26/2022   No Known Allergies      Medication List     TAKE these medications    amLODipine 2.5 MG tablet Commonly known as: NORVASC Take 1 tablet (2.5 mg total) by mouth daily.   apixaban  5 MG Tabs tablet Commonly known as: ELIQUIS Take 1 tablet (5 mg total) by mouth 2 (two) times daily.   aspirin EC 81 MG tablet Take 81 mg by mouth daily.   atorvastatin 20 MG tablet Commonly known as: LIPITOR Take 1 tablet (20 mg total) by mouth daily.   calcium acetate 667 MG capsule Commonly known as: PHOSLO Take 1 capsule (667 mg total) by mouth 3 (three) times daily with meals.   isosorbide dinitrate 10 MG tablet Commonly known as: ISORDIL Take 1 tablet (10 mg total)  by mouth 3 (three) times daily.   oxyCODONE-acetaminophen 5-325 MG tablet Commonly known as: PERCOCET/ROXICET Take 1 tablet by mouth every 4 (four) hours as needed for severe pain (pain score 7-10).   pantoprazole 40 MG tablet Commonly known as: Protonix Take 1 tablet (40 mg total) by mouth daily.   sevelamer carbonate 800 MG tablet Commonly known as: RENVELA Take 1,600 mg by mouth 3 (three) times daily.               Durable Medical Equipment  (From admission, onward)           Start     Ordered   12/26/22 1141  For home use only DME lightweight manual wheelchair with seat cushion  Once       Comments: Patient suffers from generalized weakness which impairs their ability to perform daily activities like bathing, dressing, feeding, grooming, and toileting in the home.  A cane or walker will not resolve  issue with performing activities of daily living. A wheelchair will allow patient to safely perform daily activities. Patient is not able to propel themselves in the home using a standard weight wheelchair due to endurance and general weakness. Patient can self propel in the lightweight wheelchair. Length of need Lifetime. Accessories: elevating leg rests (ELRs), wheel locks, extensions and anti-tippers.   12/26/22 1140            Follow-up Information     Georgiana Spinner, NP Follow up in 2 week(s).   Specialty: Vascular Surgery Why: Right lower extremity Ultrasound with ABI's Contact information: [address] [place] [address] [phone]                No Known Allergies  Consultations: Vasc surg Nephro    Procedures/Studies: PERIPHERAL VASCULAR CATHETERIZATION  Result Date: 12/23/2022 See surgical note for result.  VAS [place] LOWER EXTREMITY ARTERIAL DUPLEX  Result Date: 12/20/2022 LOWER EXTREMITY ARTERIAL DUPLEX STUDY Patient Name:  Anthony Hess  Date of Exam:   12/15/2022 Medical Rec #: [id number]     Accession #:     [id number] Date of Birth: [birth year]    Patient Gender: M Patient Age:   66 years Exam Location:  [facility] Procedure:      VAS [place] LOWER EXTREMITY ARTERIAL DUPLEX Referring Phys: [name] --------------------------------------------------------------------------------  Indications: Rest pain.  Current ABI: Rt 0.0, Lt 1.01 Performing Technologist: Debbe Bales RVS  Examination Guidelines: A complete evaluation includes B-mode imaging, spectral Doppler, color Doppler, and power Doppler as needed of all accessible portions of each vessel. Bilateral testing is considered an integral part of a complete examination. Limited examinations for reoccurring indications may be performed as noted.  +-----------+--------+-----+--------+-------------+--------+ RIGHT      PSV cm/sRatioStenosisWaveform     Comments +-----------+--------+-----+--------+-------------+--------+ CFA Distal 60                   triphasic             +-----------+--------+-----+--------+-------------+--------+  DFA        45                   triphasic             +-----------+--------+-----+--------+-------------+--------+ SFA Prox   0                    Occluded              +-----------+--------+-----+--------+-------------+--------+ SFA Mid    0                    Occluded              +-----------+--------+-----+--------+-------------+--------+ SFA Distal 0                    Occluded              +-----------+--------+-----+--------+-------------+--------+ POP Distal 14                   monophasic            +-----------+--------+-----+--------+-------------+--------+ ATA Distal 10                   Non pulsatile         +-----------+--------+-----+--------+-------------+--------+ PTA Distal 0                    Occluded              +-----------+--------+-----+--------+-------------+--------+ PERO Distal0                    Occluded               +-----------+--------+-----+--------+-------------+--------+  +-----------+--------+-----+--------+------------------+--------+ LEFT       PSV cm/sRatioStenosisWaveform          Comments +-----------+--------+-----+--------+------------------+--------+ CFA Distal 83                   triphasic                  +-----------+--------+-----+--------+------------------+--------+ DFA        75                   triphasic                  +-----------+--------+-----+--------+------------------+--------+ SFA Prox   138                  biphasic                   +-----------+--------+-----+--------+------------------+--------+ SFA Mid    53                   biphasic                   +-----------+--------+-----+--------+------------------+--------+ SFA Distal 40                   biphasic                   +-----------+--------+-----+--------+------------------+--------+ POP Distal 21                   monophasic                 +-----------+--------+-----+--------+------------------+--------+ ATA Distal 7                    Blunted Monophasic         +-----------+--------+-----+--------+------------------+--------+  PTA Distal 26                   monophasic                 +-----------+--------+-----+--------+------------------+--------+ PERO Distal18                   monophasic                 +-----------+--------+-----+--------+------------------+--------+  Summary: Right: Imaging and Waveforms obtained throughout in the Right Lower Extremity. No flow seen in the Right SFA, PTA and Peroneal Artery. Left: Imaging and Waveforms obtained throughout in the Left Lower Extremity. Monophasic Flow seen in the Left PTA/Peroneal Artery and Blunted Waveforms seen in the ATA.  See table(s) above for measurements and observations. Electronically signed by [name] MD on 12/20/2022 at 5:09:02 PM.    Final    VAS [place] ABI WITH/WO TBI  Result Date:  12/20/2022  LOWER EXTREMITY DOPPLER STUDY Patient Name:  Reymond Maynez  Date of Exam:   12/15/2022 Medical Rec #: [id number]     Accession #:    [id number] Date of Birth: [birth year]    Patient Gender: M Patient Age:   80 years Exam Location:  [facility] Procedure:      VAS [place] ABI WITH/WO TBI Referring Phys: [name] --------------------------------------------------------------------------------  Indications: Rest pain.  Performing Technologist: Debbe Bales RVS  Examination Guidelines: A complete evaluation includes at minimum, Doppler waveform signals and systolic blood pressure reading at the level of bilateral brachial, anterior tibial, and posterior tibial arteries, when vessel segments are accessible. Bilateral testing is considered an integral part of a complete examination. Photoelectric Plethysmograph (PPG) waveforms and toe systolic pressure readings are included as required and additional duplex testing as needed. Limited examinations for reoccurring indications may be performed as noted.  ABI Findings: +---------+------------------+-----+--------+--------+ Right    Rt Pressure (mmHg)IndexWaveformComment  +---------+------------------+-----+--------+--------+ Brachial 90                                      +---------+------------------+-----+--------+--------+ ATA      0                 0.00 absent           +---------+------------------+-----+--------+--------+ PTA      0                 0.00 absent           +---------+------------------+-----+--------+--------+ Great Toe0                 0.00 Absent           +---------+------------------+-----+--------+--------+ +---------+------------------+-----+----------+-------+ Left     Lt Pressure (mmHg)IndexWaveform  Comment +---------+------------------+-----+----------+-------+ Brachial 87                                       +---------+------------------+-----+----------+-------+ ATA       0                 0.00 absent            +---------+------------------+-----+----------+-------+ PTA      91                1.01 monophasic        +---------+------------------+-----+----------+-------+ Oda Cogan  0.00 Absent            +---------+------------------+-----+----------+-------+ +-------+-----------+-----------+------------+------------+ ABI/TBIToday's ABIToday's TBIPrevious ABIPrevious TBI +-------+-----------+-----------+------------+------------+ Right  0.0        0.0                                 +-------+-----------+-----------+------------+------------+ Left   1.01       0                                   +-------+-----------+-----------+------------+------------+  Summary: Right: Resting right ankle-brachial index indicates critical limb ischemia. The right toe-brachial index is abnormal. Left: Resting left ankle-brachial index is within normal range. The left toe-brachial index is abnormal. *See table(s) above for measurements and observations.  Electronically signed by [name] MD on 12/20/2022 at 5:08:56 PM.    Final    (Echo, Carotid, EGD, Colonoscopy, ERCP)    Subjective: Pt c/o right leg pain    Discharge Exam: Vitals:   12/25/22 2012 12/26/22 0428  BP: 134/89 106/81  Pulse: 85 80  Resp: 14 17  Temp: 98 F (36.7 C) 98.2 F (36.8 C)  SpO2: 95% 96%   Vitals:   12/25/22 1424 12/25/22 1500 12/25/22 2012 12/26/22 0428  BP:  112/81 134/89 106/81  Pulse:  91 85 80  Resp:  18 14 17   Temp:  (!) 97.5 F (36.4 C) 98 F (36.7 C) 98.2 F (36.8 C)  TempSrc:  Oral Oral Oral  SpO2:  95% 95% 96%  Weight: 50.5 kg     Height:        General: Pt is alert, awake, not in acute distress Cardiovascular:  S1/S2 +, no rubs, no gallops Respiratory: CTA bilaterally, no wheezing, no rhonchi Abdominal: Soft, NT, ND, bowel sounds + Extremities: no cyanosis    The results of significant diagnostics from this  hospitalization (including imaging, microbiology, ancillary and laboratory) are listed below for reference.     Microbiology: Recent Results (from the past 240 hour(s))  MRSA Next Gen by PCR, Nasal     Status: None   Collection Time: 12/21/22 12:38 PM   Specimen: Nasal Mucosa; Nasal Swab  Result Value Ref Range Status   MRSA by PCR Next Gen NOT DETECTED NOT DETECTED Final    Comment: (NOTE) The GeneXpert MRSA Assay (FDA approved for NASAL specimens only), is one component of a comprehensive MRSA colonization surveillance program. It is not intended to diagnose MRSA infection nor to guide or monitor treatment for MRSA infections. Test performance is not FDA approved in patients less than 10 years old. Performed at [facility], [address] [place], [place] [address]      Labs: BNP (last 3 results) Recent Labs    09/06/22 0516 09/17/22 0317 11/16/22 0213  BNP >4,500.0* 3,873.0* >4,500.0*   Basic Metabolic Panel: Recent Labs  Lab 12/22/22 0243 12/23/22 0513 12/24/22 0124 12/25/22 0440 12/26/22 0455  NA 134* 137 133* 137 138  K 4.2 4.4 4.2 5.2* 4.9  CL 96* 98 96* 99 96*  CO2 24 22 22  19* 23  GLUCOSE 86 82 87 80 103*  BUN 34* 63* 30* 51* 43*  CREATININE 4.89* 7.72* 5.28* 7.18* 5.70*  CALCIUM 7.2* 6.7* 7.2* 7.3* 8.0*   Liver Function Tests: Recent Labs  Lab 12/21/22 1040 12/22/22 0243  AST 58* 39  ALT 20 19  ALKPHOS 68 62  BILITOT 0.7 0.8  PROT 6.7 7.2  ALBUMIN 3.2* 4.0   No results for input(s): "LIPASE", "AMYLASE" in the last 168 hours. No results for input(s): "AMMONIA" in the last 168 hours. CBC: Recent Labs  Lab 12/21/22 1040 12/22/22 0243 12/23/22 0513 12/24/22 0124 12/25/22 0440 12/26/22 0455  WBC 9.8 7.7 7.9 11.8* 14.6* 15.9*  NEUTROABS 7.1  --   --   --   --   --   HGB 12.2* 13.1 12.9* 13.0 13.3 13.7  HCT 37.7* 41.2 40.2 40.9 41.2 42.3  MCV 90.8 91.8 91.2 93.4 90.5 89.6  PLT 292 284 308 333 303 339   Cardiac Enzymes: No  results for input(s): "CKTOTAL", "CKMB", "CKMBINDEX", "TROPONINI" in the last 168 hours. BNP: Invalid input(s): "POCBNP" CBG: Recent Labs  Lab 12/21/22 1231 12/21/22 1248 12/24/22 0807  GLUCAP 62* 74 89   D-Dimer No results for input(s): "DDIMER" in the last 72 hours. Hgb A1c No results for input(s): "HGBA1C" in the last 72 hours. Lipid Profile No results for input(s): "CHOL", "HDL", "LDLCALC", "TRIG", "CHOLHDL", "LDLDIRECT" in the last 72 hours. Thyroid function studies No results for input(s): "TSH", "T4TOTAL", "T3FREE", "THYROIDAB" in the last 72 hours.  Invalid input(s): "FREET3" Anemia work up No results for input(s): "VITAMINB12", "FOLATE", "FERRITIN", "TIBC", "IRON", "RETICCTPCT" in the last 72 hours. Urinalysis    Component Value Date/Time   COLORURINE YELLOW (A) 08/10/2022 1634   APPEARANCEUR TURBID (A) 08/10/2022 1634   APPEARANCEUR Cloudy (A) 05/08/2019 1448   LABSPEC 1.017 08/10/2022 1634   PHURINE 7.0 08/10/2022 1634   GLUCOSEU NEGATIVE 08/10/2022 1634   HGBUR SMALL (A) 08/10/2022 1634   BILIRUBINUR NEGATIVE 08/10/2022 1634   BILIRUBINUR Negative 05/08/2019 1448   KETONESUR NEGATIVE 08/10/2022 1634   PROTEINUR >=300 (A) 08/10/2022 1634   NITRITE NEGATIVE 08/10/2022 1634   LEUKOCYTESUR MODERATE (A) 08/10/2022 1634   Sepsis Labs Recent Labs  Lab 12/23/22 0513 12/24/22 0124 12/25/22 0440 12/26/22 0455  WBC 7.9 11.8* 14.6* 15.9*   Microbiology Recent Results (from the past 240 hour(s))  MRSA Next Gen by PCR, Nasal     Status: None   Collection Time: 12/21/22 12:38 PM   Specimen: Nasal Mucosa; Nasal Swab  Result Value Ref Range Status   MRSA by PCR Next Gen NOT DETECTED NOT DETECTED Final    Comment: (NOTE) The GeneXpert MRSA Assay (FDA approved for NASAL specimens only), is one component of a comprehensive MRSA colonization surveillance program. It is not intended to diagnose MRSA infection nor to guide or monitor treatment for MRSA  infections. Test performance is not FDA approved in patients less than 38 years old. Performed at [facility], [address] [place], [place] [address]      Time coordinating discharge: Over 30 minutes  SIGNED:   Charise Killian, MD  [facility] 12/26/2022, 1:13 PM Pager   If 7PM-7AM, please contact night-coverage

## 2022-12-26 NOTE — Progress Notes (Signed)
IV removed. Patient verbalized understanding of discharge paperwork. Patient very anxious to get discharged, explained the wheelchair is in route from the DME company. Patient stated he is leaving without the wheelchair, he said he has one borrowed from the church.  Anthony Hess [name]

## 2022-12-26 NOTE — Plan of Care (Signed)

## 2022-12-26 NOTE — Progress Notes (Signed)
[facility]  PROGRESS NOTE   Subjective:   Patient seen at bedside.  Had stable dialysis yesterday.  Objective:  Vital signs: Blood pressure 106/81, pulse 80, temperature 98.2 F (36.8 C), temperature source Oral, resp. rate 17, height 5\' 3"  (1.6 m), weight 50.5 kg, SpO2 96%. No intake or output data in the 24 hours ending 12/26/22 1518 Filed Weights   12/23/22 1424 12/25/22 1043 12/25/22 1424  Weight: 51 kg 51.5 kg 50.5 kg     Physical Exam: General:  No acute distress  Head:  Normocephalic, atraumatic. Moist oral mucosal membranes  Eyes:  Anicteric  Neck:  Supple  Lungs:   Clear to auscultation, normal effort  Heart:  S1S2 no rubs  Abdomen:   Soft, nontender, bowel sounds present  Extremities:  peripheral edema.  Neurologic:  Awake, alert, following commands  Skin:  No lesions  Access:     Basic Metabolic Panel: Recent Labs  Lab 12/22/22 0243 12/23/22 0513 12/24/22 0124 12/25/22 0440 12/26/22 0455  NA 134* 137 133* 137 138  K 4.2 4.4 4.2 5.2* 4.9  CL 96* 98 96* 99 96*  CO2 24 22 22  19* 23  GLUCOSE 86 82 87 80 103*  BUN 34* 63* 30* 51* 43*  CREATININE 4.89* 7.72* 5.28* 7.18* 5.70*  CALCIUM 7.2* 6.7* 7.2* 7.3* 8.0*   GFR: Estimated Creatinine Clearance: 9.4 mL/min (A) (by C-G formula based on SCr of 5.7 mg/dL (H)).  Liver Function Tests: Recent Labs  Lab 12/21/22 1040 12/22/22 0243  AST 58* 39  ALT 20 19  ALKPHOS 68 62  BILITOT 0.7 0.8  PROT 6.7 7.2  ALBUMIN 3.2* 4.0   No results for input(s): "LIPASE", "AMYLASE" in the last 168 hours. No results for input(s): "AMMONIA" in the last 168 hours.  CBC: Recent Labs  Lab 12/21/22 1040 12/22/22 0243 12/23/22 0513 12/24/22 0124 12/25/22 0440 12/26/22 0455  WBC 9.8 7.7 7.9 11.8* 14.6* 15.9*  NEUTROABS 7.1  --   --   --   --   --   HGB 12.2* 13.1 12.9* 13.0 13.3 13.7  HCT 37.7* 41.2 40.2 40.9 41.2 42.3  MCV 90.8 91.8 91.2 93.4 90.5 89.6  PLT 292 284 308 333 303 339      HbA1C: Hgb A1c MFr Bld  Date/Time Value Ref Range Status  06/25/2022 02:14 PM 4.8 4.8 - 5.6 % Final    Comment:             Prediabetes: 5.7 - 6.4          Diabetes: >6.4          Glycemic control for adults with diabetes: <7.0   12/27/2019 03:50 PM 5.6 4.8 - 5.6 % Final    Comment:    (NOTE) Pre diabetes:          5.7%-6.4%  Diabetes:              >6.4%  Glycemic control for   <7.0% adults with diabetes     Urinalysis: No results for input(s): "COLORURINE", "LABSPEC", "PHURINE", "GLUCOSEU", "HGBUR", "BILIRUBINUR", "KETONESUR", "PROTEINUR", "UROBILINOGEN", "NITRITE", "LEUKOCYTESUR" in the last 72 hours.  Invalid input(s): "APPERANCEUR"    Imaging: No results found.   Medications:     apixaban  5 mg Oral BID   aspirin EC  81 mg Oral Daily   atorvastatin  20 mg Oral Daily   calcium acetate  1,334 mg Oral TID WC   Chlorhexidine Gluconate Cloth  6 each Topical Q0600  midodrine  10 mg Oral TID WC   sevelamer carbonate  1,600 mg Oral TID WC   sodium chloride flush  10 mL Intravenous Q12H    Assessment/ Plan:     67 year old male with history of hypertension, coronary artery disease, congestive heart failure, atrial fibrillation, status post CABG, hyperlipidemia, peripheral vascular disease, end-stage renal disease on hemodialysis admitted for angiogram of the lower extremity.   #1: ESRD: Tolerated dialysis treatment yesterday.  Potassium level now improved.   #2: Anemia: Patient will continue anemia protocol as outpatient.   #3: Secondary hyperparathyroidism: We will check PTH, calcium and phosphorus levels.  Will continue sevelamer.   #4: Hypotension: Patient will continue to receive midodrine.  #5: Right limb ischemia with worsening pain: Vascular note appreciated.  Vascular is suggesting amputation but patient is not willing to go for it at this time.    Labs and medications reviewed. Will continue to follow along with you.   LOS: 5 Lorain Childes, MD [facility] 11/3/20243:18 PM

## 2022-12-26 NOTE — Evaluation (Signed)
Occupational Therapy Evaluation Patient Details Name: Anthony Hess MRN: [id number] DOB: [birth year] Today's Date: 12/26/2022   History of Present Illness Pt is a 67 y.o. male with medical history significant of ESRD on HD TTS, CAD, status post CABG, paroxysmal atrial fibrillation, history of pulmonary embolism, tobacco use disorder, PVD, hyperlipidemia, long history of medical nonadherence presenting with limb ischemia and hyperkalemia. Pt noted to have been evaluated by vascular surgery planning angiogram today. Has been reporting bilateral limb pain and weakness especially with walking. Noted baseline peripheral vascular disease. Vascular surgery and plan for angiogram to assess for any current peripheral vascular occlusion; PAD on 12/23/22.   Clinical Impression   Patient received for OT evaluation. See flowsheet below for details of function. Generally, patient requiring MOD (I) for bed mobility, pt refusing to attempt standing or functional mobility 2/2 pain (although also stating he'd like to go home and feels like he'll "be fine" and believes he can do stairs to get into house), and MOD-MAX A overall for ADLs. Patient will benefit from continued OT while in acute care.        If plan is discharge home, recommend the following: A lot of help with walking and/or transfers;A lot of help with bathing/dressing/bathroom;Assistance with cooking/housework;Direct supervision/assist for medications management;Direct supervision/assist for financial management;Assist for transportation;Help with stairs or ramp for entrance    Functional Status Assessment  Patient has had a recent decline in their functional status and demonstrates the ability to make significant improvements in function in a reasonable and predictable amount of time.  Equipment Recommendations  BSC/3in1;Tub/shower bench;Wheelchair (measurements OT) (if pt decides to go home, OT recommends this equipment)    Recommendations for  Other Services       Precautions / Restrictions Precautions Precautions: Fall Precaution Comments: RLE hypersensitivity Restrictions Weight Bearing Restrictions: No      Mobility Bed Mobility Overal bed mobility: Modified Independent Bed Mobility: Supine to Sit, Sit to Supine     Supine to sit: Modified independent (Device/Increase time) Sit to supine: Modified independent (Device/Increase time)   General bed mobility comments: needed extra time, but no physical assistance.    Transfers                   General transfer comment: Pt states he cannot try to transfer to standing, even with OT assist and/or RW 2/2 RLE pain. Encouragement provided; pt continues to decline.      Balance                                           ADL either performed or assessed with clinical judgement   ADL Overall ADL's : Needs assistance/impaired   Eating/Feeding Details (indicate cue type and reason): pt with significant spill of juice on bedside table and gown when OT arrived.                 Lower Body Dressing: Maximal assistance Lower Body Dressing Details (indicate cue type and reason): OT assisted in donning sock on RLE; pt already had a sock on LLE when OT arrived.               General ADL Comments: Pt declines ADLs today; anticipate he will require significant assist with ADLs at home given decreased activity tolerance, inability to stand or transfer to another surface today, pain, pt's unwillingness to try standing with assist due  to pain, and decreased ROM (unable to lean forward to legs).     Vision Patient Visual Report: No change from baseline       Perception         Praxis         Pertinent Vitals/Pain Pain Assessment Pain Assessment: 0-10 Pain Score: 7  Pain Location: RLE Pain Descriptors / Indicators: Sharp Pain Intervention(s): Limited activity within patient's tolerance, Monitored during session      Extremity/Trunk Assessment Upper Extremity Assessment Upper Extremity Assessment: Overall WFL for tasks assessed   Lower Extremity Assessment Lower Extremity Assessment: Defer to PT evaluation;RLE deficits/detail RLE Deficits / Details: hypersensitive during mobility       Communication Communication Communication: No apparent difficulties   Cognition Arousal: Alert Behavior During Therapy: WFL for tasks assessed/performed                                   General Comments: Pt is alert and oriented. However, he demonstrates decreased health literacy; unable to stand today, but states "I can get up the steps at home; there are only 3 of them." Not understanding that his abilities in the hospital will be the same has his abilities at home.     General Comments  Vascular MD in room during discussion about pt's needs at home (end of session) and inability to functionally transfer today. Pt calling wife during session and stating that he'd like to go home; does not seem to understand that him going home will not solve the problems he's having with mobility today and how difficulty it will be for him to mobilize at home. Wife also stating (on phone) that she'd like for him to come home.    Exercises     Shoulder Instructions      Home Living Family/patient expects to be discharged to:: Private residence Living Arrangements: Spouse/significant other Available Help at Discharge: Family;Available 24 hours/day Type of Home: House Home Access: Stairs to enter [employer] of Steps: 3 steps to enter, 6 steps in the back Entrance Stairs-Rails: Right;Left;Can reach both Home Layout: Two level Alternate Level Stairs-Number of Steps: 1 flight   Bathroom Shower/Tub: Chief Strategy Officer: Standard Bathroom Accessibility: Yes How Accessible: Accessible via wheelchair Home Equipment: Rolling Walker (2 wheels);Grab bars - tub/shower   Additional  Comments: Pt states he lives on first floor. Spouse at home and can assist 24/7 as needed.      Prior Functioning/Environment Prior Level of Function : Independent/Modified Independent;Driving             Mobility Comments: no AD use for amb ADLs Comments: Independent with ADL/IADLs; works at [employer].        OT Problem List: Decreased range of motion;Decreased activity tolerance;Decreased safety awareness;Pain      OT Treatment/Interventions: Self-care/ADL training;Therapeutic exercise;DME and/or AE instruction;Therapeutic activities;Patient/family education    OT Goals(Current goals can be found in the care plan section) Acute Rehab OT Goals Patient Stated Goal: Go home; pain to stop OT Goal Formulation: With patient Time For Goal Achievement: 01/09/23 Potential to Achieve Goals: Fair  OT Frequency: Min 1X/week    Co-evaluation              AM-PAC OT "6 Clicks" Daily Activity     Outcome Measure Help from another person eating meals?: None Help from another person taking care of personal grooming?: A Little Help from  another person toileting, which includes using toliet, bedpan, or urinal?: A Lot Help from another person bathing (including washing, rinsing, drying)?: A Lot Help from another person to put on and taking off regular upper body clothing?: None Help from another person to put on and taking off regular lower body clothing?: Total 6 Click Score: 16   End of Session Nurse Communication: Mobility status  Activity Tolerance: Patient limited by pain Patient left: in bed;with call bell/phone within reach;with bed alarm set  OT Visit Diagnosis: Pain;Other abnormalities of gait and mobility (R26.89) Pain - Right/Left: Right Pain - part of body: Leg                Time: 1610-9604 OT Time Calculation (min): 15 min Charges:  OT General Charges $OT Visit: 1 Visit OT Evaluation $OT Eval Moderate Complexity: 1 Mod  Estreya Clay Junie Panning, MS,  OTR/L  Alvester Morin 12/26/2022, 11:45 AM

## 2022-12-27 ENCOUNTER — Telehealth (INDEPENDENT_AMBULATORY_CARE_PROVIDER_SITE_OTHER): Payer: Self-pay

## 2022-12-27 NOTE — Telephone Encounter (Signed)
Patients spouse called stating Rosanne Ashing came home yesterday and she would like to go over a treatment plan for the patient. Patient had a Lower extremity angio on 12/21/22.   Please advise

## 2022-12-27 NOTE — Telephone Encounter (Signed)
At this time we will try to allow to see if the patient will regain some motor function in his leg.  He was explained to the patient by our on-call surgeon this week and that much of the motor function he had was lost due to his prolonged ischemia and now it is in the higher to see if he will regain some of his normal function.  If not the likely next step will be amputation.  He has an upcoming appointment where he can discuss further with Dr. Gilda Crease if he is still not able to ambulate.

## 2022-12-27 NOTE — Telephone Encounter (Signed)
I spoke with the patient and stated that he is not able to walk since his angio. Patient will like to know the next plan.

## 2022-12-28 NOTE — Telephone Encounter (Signed)
Patient spouse was notified with medical recommendations and verbalized understanding

## 2022-12-29 ENCOUNTER — Telehealth (INDEPENDENT_AMBULATORY_CARE_PROVIDER_SITE_OTHER): Payer: Self-pay

## 2022-12-29 NOTE — Telephone Encounter (Signed)
Patient left a message requesting for a pain medication refill. Please Advise

## 2022-12-31 ENCOUNTER — Other Ambulatory Visit (INDEPENDENT_AMBULATORY_CARE_PROVIDER_SITE_OTHER): Payer: Self-pay | Admitting: Vascular Surgery

## 2022-12-31 DIAGNOSIS — M79606 Pain in leg, unspecified: Secondary | ICD-10-CM | POA: Insufficient documentation

## 2022-12-31 MED ORDER — OXYCODONE-ACETAMINOPHEN 5-325 MG PO TABS: 1.0000 | ORAL_TABLET | Freq: Four times a day (QID) | ORAL | 0 refills | Status: DC | PRN

## 2022-12-31 NOTE — Telephone Encounter (Signed)
Oxycodone has been sent to [facility] and patient spouse has been notified.

## 2022-12-31 NOTE — Progress Notes (Signed)
Patient is not likely reconstructable from an arterial standpoint will likely need an amputation.  He is scheduled to follow-up on Monday.  I will refill his pain medication until he is seen in the office.

## 2023-01-03 ENCOUNTER — Encounter (INDEPENDENT_AMBULATORY_CARE_PROVIDER_SITE_OTHER): Payer: Medicare Other

## 2023-01-03 ENCOUNTER — Ambulatory Visit (INDEPENDENT_AMBULATORY_CARE_PROVIDER_SITE_OTHER): Payer: Medicare Other | Admitting: Vascular Surgery

## 2023-01-03 ENCOUNTER — Encounter (INDEPENDENT_AMBULATORY_CARE_PROVIDER_SITE_OTHER): Payer: Self-pay | Admitting: Vascular Surgery

## 2023-01-03 VITALS — BP 100/68 | HR 79 | Resp 18 | Ht 63.0 in | Wt 111.0 lb

## 2023-01-03 DIAGNOSIS — I10 Essential (primary) hypertension: Secondary | ICD-10-CM | POA: Diagnosis not present

## 2023-01-03 DIAGNOSIS — I480 Paroxysmal atrial fibrillation: Secondary | ICD-10-CM | POA: Diagnosis not present

## 2023-01-03 DIAGNOSIS — I25119 Atherosclerotic heart disease of native coronary artery with unspecified angina pectoris: Secondary | ICD-10-CM | POA: Diagnosis not present

## 2023-01-03 DIAGNOSIS — N186 End stage renal disease: Secondary | ICD-10-CM

## 2023-01-03 DIAGNOSIS — I70221 Atherosclerosis of native arteries of extremities with rest pain, right leg: Secondary | ICD-10-CM | POA: Diagnosis not present

## 2023-01-05 DIAGNOSIS — I70229 Atherosclerosis of native arteries of extremities with rest pain, unspecified extremity: Secondary | ICD-10-CM | POA: Insufficient documentation

## 2023-01-05 NOTE — Progress Notes (Signed)
MRN : [id number]  Anthony Hess is a 67 y.o. (1955/05/24) male who presents with chief complaint of check circulation.  History of Present Illness:   The patient returns to the office for followup and review status post angiogram with intervention on 12/23/2022.   Procedure:  Percutaneous transluminal angioplasty of right posterior tibial artery and tibioperoneal trunk with 2 mm diameter by 30 cm length angioplasty balloon 2.    Percutaneous transluminal angioplasty of the right SFA and popliteal arteries with 5 mm diameter by 30 cm length angioplasty balloon 3.    Stent placement to the right SFA and popliteal arteries with 6 mm diameter by 25 cm length and 6 mm diameter by 15 cm length Viabahn stents  The patient notes improvement in the lower extremity symptoms.  At the time of discharge he was unable to move or feel his foot.  Today he is stating he can feel it with relatively good sensation.  He is also able to move his foot.  No new ulcers or wounds have occurred since the last visit.  There have been no significant changes to the patient's overall health care.  No documented history of amaurosis fugax or recent TIA symptoms. There are no recent neurological changes noted. No documented history of DVT, PE or superficial thrombophlebitis. The patient denies recent episodes of angina or shortness of breath.    Current Meds  Medication Sig   amLODipine (NORVASC) 2.5 MG tablet Take 1 tablet (2.5 mg total) by mouth daily.   apixaban (ELIQUIS) 5 MG TABS tablet Take 1 tablet (5 mg total) by mouth 2 (two) times daily.   aspirin EC 81 MG tablet Take 81 mg by mouth daily.   atorvastatin (LIPITOR) 20 MG tablet Take 1 tablet (20 mg total) by mouth daily.   calcium acetate (PHOSLO) 667 MG capsule Take 1 capsule (667 mg total) by mouth 3 (three) times daily with meals.   isosorbide dinitrate (ISORDIL) 10 MG tablet Take 1  tablet (10 mg total) by mouth 3 (three) times daily.   oxyCODONE-acetaminophen (PERCOCET/ROXICET) 5-325 MG tablet Take 1-2 tablets by mouth every 6 (six) hours as needed for severe pain (pain score 7-10).   pantoprazole (PROTONIX) 40 MG tablet Take 1 tablet (40 mg total) by mouth daily.   sevelamer carbonate (RENVELA) 800 MG tablet Take 1,600 mg by mouth 3 (three) times daily.    Past Medical History:  Diagnosis Date   Aortic atherosclerosis (HCC)    Bilateral carotid artery disease (HCC)    Bladder cancer (HCC)    Coronary artery disease 12/20/2018   a.) LHC 12/20/2018: 50% OM1, 40% OM2, 95% o-pLAD, 75% o=pLCx, 40% mLM, 70% D1, 60% mRCA-1, 50% mRCA-2; refer to CVTS. b.) 4v CABG at [facility] on 12/27/2018: LIMA-LAD, RIMA-PDA, seg LRA-OM1-D1   DCM (dilated cardiomyopathy) (HCC) 12/05/2018   a.) TTE 12/05/2018: EF 40-45%. b.) TTE 12/28/2019: EF 20-25%.   Dyspnea 10/15/2022   ESRD (end stage renal disease) (HCC)    a.) T-Th-Sat   HFrEF (heart failure with reduced ejection fraction) (HCC) 12/05/2018   a.) TTE 12/05/2018: EF 40-45%;  mild LVH; ant/apical/sep HK; mild TR . b.) TTE 12/28/2019: EF 20-25%; mod LVH; mod MR/AR; G1DD.   History of 2019 novel coronavirus disease (COVID-19) 04/08/2021   History of kidney stones    HLD (hyperlipidemia)    Hx of CABG 12/27/2018   Hypertension    Infrarenal abdominal aortic aneurysm (AAA) without rupture (HCC) 03/05/2021   a.) CT abd/pelvis; measured 3.2 cm.   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   a.) LIMA-LAD, RIMA-PDA, sequential LEFT radial artery to OM1 and D1   Sepsis (HCC) 03/14/2021   Wears glasses     Past Surgical History:  Procedure Laterality Date   AV FISTULA PLACEMENT Left 07/30/2021   Procedure: INSERTION OF ARTERIOVENOUS (AV) GORE-TEX GRAFT ARM BRACHIAL  ARTERY TO AXILLARY VEIN;  Surgeon: Annice Needy, MD;  Location: [facility]S;  Service: Vascular;  Laterality: Left;   CAPD INSERTION N/A 12/31/2019   Procedure: LAPAROSCOPIC INSERTION CONTINUOUS AMBULATORY PERITONEAL DIALYSIS  (CAPD) CATHETER;  Surgeon: Leafy Ro, MD;  Location: [facility];  Service: General;  Laterality: N/A;   CAPD REMOVAL N/A 04/10/2020   Procedure: LAPAROSCOPIC REVISION OF CONTINUOUS AMBULATORY PERITONEAL DIALYSIS  (CAPD) CATHETER;  Surgeon: Leafy Ro, MD;  Location: [facility];  Service: General;  Laterality: N/A;   CORONARY ARTERY BYPASS GRAFT N/A 12/27/2018   Procedure: CORONARY ARTERY BYPASS GRAFTING (CABG) X 4 ON PUMP USING RIGHT & LEFT INTERNAL MAMMARY ARTERY LEFT RADIAL ARTERY ENDOSCOPICALLY HARVESTED;  Surgeon: Linden Dolin, MD;  [facility];  Service: Open Heart Surgery;  Laterality: N/A;   CYSTOSCOPY W/ RETROGRADES Bilateral 05/15/2019   Procedure: CYSTOSCOPY WITH RETROGRADE PYELOGRAM;  Surgeon: Riki Altes, MD;  Location: [facility];  Service: Urology;  Laterality: Bilateral;   CYSTOSCOPY WITH BIOPSY N/A 05/15/2019   Procedure: CYSTOSCOPY WITH bladder BIOPSY;  Surgeon: Riki Altes, MD;  Location: [facility];  Service: Urology;  Laterality: N/A;   DIALYSIS/PERMA CATHETER INSERTION N/A 12/28/2019   Procedure: DIALYSIS/PERMA CATHETER INSERTION;  Surgeon: Annice Needy, MD;  Location: [facility];  Service: Cardiovascular;  Laterality: N/A;   DIALYSIS/PERMA CATHETER INSERTION N/A 03/18/2021   Procedure: DIALYSIS/PERMA CATHETER INSERTION;  Surgeon: Annice Needy, MD;  Location: [facility];  Service: Cardiovascular;  Laterality: N/A;   DIALYSIS/PERMA CATHETER REMOVAL N/A 06/02/2020   Procedure: DIALYSIS/PERMA CATHETER REMOVAL;  Surgeon: Annice Needy, MD;  Location: [facility];  Service: Cardiovascular;  Laterality: N/A;   DIALYSIS/PERMA CATHETER REPAIR N/A 10/22/2022   Procedure: DIALYSIS/PERMA CATHETER REPAIR;  Surgeon: Renford Dills, MD;  Location: [facility];  Service: Cardiovascular;  Laterality: N/A;   EXCHANGE OF A DIALYSIS CATHETER Right 04/10/2020   Procedure: EXCHANGE OF A DIALYSIS CATHETER;  Surgeon: Leafy Ro, MD;  Location: [facility];  Service: General;  Laterality: Right;   INCISIONAL HERNIA REPAIR  01/20/2021   Procedure: HERNIA REPAIR INCISIONAL;  Surgeon: Henrene Dodge, MD;  Location: [facility];  Service: General;;   IR IMAGE GUIDED DRAINAGE PERCUT CATH  PERITONEAL RETROPERIT  04/07/2020   LAPAROSCOPY N/A 04/16/2021   Procedure: LAPAROSCOPY DIAGNOSTIC;  Surgeon: Sung Amabile, DO;  Location: [facility];  Service: General;  Laterality: N/A;   LAPAROTOMY N/A 04/26/2022   Procedure: EXPLORATORY LAPAROTOMY WITH REPAIR OF DUODENAL ULCER;  Surgeon: Carolan Shiver, MD;  Location: [facility];  Service: General;  Laterality: N/A;   LEFT HEART CATH AND CORONARY ANGIOGRAPHY Left 12/20/2018   Procedure: LEFT HEART CATH AND CORONARY ANGIOGRAPHY;  Surgeon: Marcina Millard, MD;  Location: [facility];  Service: Cardiovascular;  Laterality: Left;   LOWER EXTREMITY ANGIOGRAPHY Right 12/23/2022   Procedure: Lower Extremity Angiography;  Surgeon: Annice Needy, MD;  Location: [facility];  Service: Cardiovascular;  Laterality: Right;   RADIAL ARTERY HARVEST Left 12/27/2018   Procedure: ENDOSCOPIC RADIAL ARTERY HARVEST;  Surgeon: Linden Dolin, MD;  [facility];  Service: Open Heart Surgery;  Laterality: Left;   REMOVAL OF A DIALYSIS CATHETER Left 03/20/2021   Procedure: REMOVAL OF A PD CATHETER;  Surgeon: Annice Needy, MD;  Location: [facility];  Service: Vascular;  Laterality: Left;   REVISION OF ARTERIOVENOUS GORETEX GRAFT Left 09/11/2021   Procedure: Excisionof infected AV graft;  Surgeon: Renford Dills, MD;  Location: [facility];  Service: Vascular;  Laterality: Left;   TEE WITHOUT CARDIOVERSION N/A 12/27/2018   Procedure: TRANSESOPHAGEAL ECHOCARDIOGRAM (TEE);  Surgeon: Linden Dolin, MD;  Location: [facility] OR;  Service: Open Heart Surgery;  Laterality: N/A;   TUMOR REMOVAL  2019   Bladder    Social History Social History   Tobacco Use   Smoking status: Every Day    Current packs/day: 0.00    Types: Cigarettes    Last attempt to quit: 02/19/2019    Years since quitting: 3.8   Smokeless tobacco: Never  Vaping Use   Vaping status: Never Used  Substance Use Topics   Alcohol use: Never   Drug use: Never    Family History Family History  Problem Relation Age of Onset   Heart failure Mother     No Known Allergies   REVIEW OF SYSTEMS (Negative unless checked)  Constitutional: [] Weight loss  [] Fever  [] Chills Cardiac: [] Chest pain   [] Chest pressure   [] Palpitations   [] Shortness of breath when laying flat   [] Shortness of breath with exertion. Vascular:  [x] Pain in legs with walking   [] Pain in legs at rest  [] History of DVT   [] Phlebitis   [] Swelling in legs   [] Varicose veins   [] Non-healing ulcers Pulmonary:   [] Uses home oxygen   [] Productive cough   [] Hemoptysis   [] Wheeze  [] COPD   [] Asthma Neurologic:  [] Dizziness   [] Seizures   [] History of stroke   [] History of TIA  [] Aphasia   [] Vissual changes   [] Weakness or numbness in arm   [] Weakness or numbness in leg Musculoskeletal:   [] Joint swelling   [] Joint pain   [] Low back pain Hematologic:  [] Easy bruising  [] Easy bleeding   [] Hypercoagulable state   [] Anemic Gastrointestinal:  [] Diarrhea   [] Vomiting  [] Gastroesophageal reflux/heartburn   [] Difficulty swallowing. Genitourinary:  [] Chronic kidney disease   [] Difficult urination  [] Frequent urination   [] Blood in urine Skin:  [] Rashes   [] Ulcers  Psychological:  [] History of anxiety   []  History of major depression.  Physical Examination  Vitals:   01/03/23 1503  BP: 100/68  Pulse: 79  Resp: 18  Weight: 111 lb (50.3 kg)  Height: 5\' 3"  (1.6 m)   Body mass index is 19.66 kg/m. Gen: WD/WN, NAD Head: [place]/AT, No temporalis wasting.   Ear/Nose/Throat: Hearing grossly intact, nares w/o erythema or drainage Eyes: PER, EOMI, sclera nonicteric.  Neck: Supple, no masses.  No bruit or JVD.  Pulmonary:  Good air movement, no audible wheezing,  no use of accessory muscles.  Cardiac: RRR, normal S1, S2, no Murmurs. Vascular: Severe trophic changes, no open wounds Vessel Right Left  Radial Palpable Palpable  PT Not Palpable Not Palpable  DP Not Palpable Not Palpable  Gastrointestinal: soft, non-distended. No guarding/no peritoneal signs.  Musculoskeletal: M/S 5/5 arms and left lower extremity right lower extremity is 3/5.  No visible deformity.  Neurologic: CN 2-12 intact. Pain and light touch diminished in the right extremities.  Symmetrical.  Speech is fluent. Motor exam as listed above. Psychiatric: Judgment intact, Mood & affect appropriate for pt's clinical situation. Dermatologic: No rashes or ulcers noted.  No changes consistent with cellulitis.   CBC Lab Results  Component Value Date   WBC 15.9 (H) 12/26/2022   HGB 13.7 12/26/2022   HCT 42.3 12/26/2022   MCV 89.6 12/26/2022   PLT 339 12/26/2022    BMET    Component Value Date/Time   NA 138 12/26/2022 0455   NA 141 06/25/2022 1414   K 4.9 12/26/2022 0455   CL 96 (L) 12/26/2022 0455   CO2 23 12/26/2022 0455   GLUCOSE 103 (H) 12/26/2022 0455   BUN 43 (H) 12/26/2022 0455   BUN 39 (H) 06/25/2022 1414   CREATININE 5.70 (H) 12/26/2022 0455   CREATININE 4.62 (H) 01/10/2019 1351   CALCIUM 8.0 (L) 12/26/2022 0455   GFRNONAA 10 (L) 12/26/2022 0455   GFRAA 15 (L) 01/04/2020 1542   Estimated Creatinine Clearance: 9.3 mL/min (A) (by C-G formula based on SCr of 5.7 mg/dL (H)).  COAG Lab Results  Component Value Date   INR 1.3 (H) 12/21/2022   INR 1.1 10/15/2022   INR 1.1 09/14/2022    Radiology PERIPHERAL VASCULAR CATHETERIZATION  Result Date: 12/23/2022 See surgical note for result.  VAS [place] LOWER EXTREMITY ARTERIAL DUPLEX  Result Date:  12/20/2022 LOWER EXTREMITY ARTERIAL DUPLEX STUDY Patient Name:  MACKSON SENA  Date of Exam:   12/15/2022 Medical Rec #: [id number]     Accession #:    [id number] Date of Birth: 08-Jan-1956    Patient Gender: M Patient Age:   68 years Exam Location:  [facility] Procedure:      VAS [place] LOWER EXTREMITY ARTERIAL DUPLEX Referring Phys: [name] --------------------------------------------------------------------------------  Indications: Rest pain.  Current ABI: Rt 0.0, Lt 1.01 Performing Technologist: Debbe Bales RVS  Examination Guidelines: A complete evaluation includes B-mode imaging, spectral Doppler, color Doppler, and power Doppler as needed of all accessible portions of each vessel. Bilateral testing is considered an integral part of a complete examination. Limited examinations for reoccurring indications may be performed as noted.  +-----------+--------+-----+--------+-------------+--------+ RIGHT      PSV cm/sRatioStenosisWaveform     Comments +-----------+--------+-----+--------+-------------+--------+ CFA Distal 60                   triphasic             +-----------+--------+-----+--------+-------------+--------+ DFA        45                   triphasic             +-----------+--------+-----+--------+-------------+--------+ SFA Prox   0                    Occluded              +-----------+--------+-----+--------+-------------+--------+ SFA Mid    0  Occluded              +-----------+--------+-----+--------+-------------+--------+ SFA Distal 0                    Occluded              +-----------+--------+-----+--------+-------------+--------+ POP Distal 14                   monophasic            +-----------+--------+-----+--------+-------------+--------+ ATA Distal 10                   Non pulsatile         +-----------+--------+-----+--------+-------------+--------+ PTA Distal 0                     Occluded              +-----------+--------+-----+--------+-------------+--------+ PERO Distal0                    Occluded              +-----------+--------+-----+--------+-------------+--------+  +-----------+--------+-----+--------+------------------+--------+ LEFT       PSV cm/sRatioStenosisWaveform          Comments +-----------+--------+-----+--------+------------------+--------+ CFA Distal 83                   triphasic                  +-----------+--------+-----+--------+------------------+--------+ DFA        75                   triphasic                  +-----------+--------+-----+--------+------------------+--------+ SFA Prox   138                  biphasic                   +-----------+--------+-----+--------+------------------+--------+ SFA Mid    53                   biphasic                   +-----------+--------+-----+--------+------------------+--------+ SFA Distal 40                   biphasic                   +-----------+--------+-----+--------+------------------+--------+ POP Distal 21                   monophasic                 +-----------+--------+-----+--------+------------------+--------+ ATA Distal 7                    Blunted Monophasic         +-----------+--------+-----+--------+------------------+--------+ PTA Distal 26                   monophasic                 +-----------+--------+-----+--------+------------------+--------+ PERO Distal18                   monophasic                 +-----------+--------+-----+--------+------------------+--------+  Summary: Right: Imaging and Waveforms obtained throughout in the Right Lower Extremity. No flow seen in the Right SFA, PTA and Peroneal Artery.  Left: Imaging and Waveforms obtained throughout in the Left Lower Extremity. Monophasic Flow seen in the Left PTA/Peroneal Artery and Blunted Waveforms seen in the ATA.  See table(s) above for measurements and  observations. Electronically signed by [name] MD on 12/20/2022 at 5:09:02 PM.    Final    VAS [place] ABI WITH/WO TBI  Result Date: 12/20/2022  LOWER EXTREMITY DOPPLER STUDY Patient Name:  Jaishon Vanderzanden  Date of Exam:   12/15/2022 Medical Rec #: [id number]     Accession #:    [id number] Date of Birth: [birth year]    Patient Gender: M Patient Age:   40 years Exam Location:  [facility] Procedure:      VAS [place] ABI WITH/WO TBI Referring Phys: [name] --------------------------------------------------------------------------------  Indications: Rest pain.  Performing Technologist: Debbe Bales RVS  Examination Guidelines: A complete evaluation includes at minimum, Doppler waveform signals and systolic blood pressure reading at the level of bilateral brachial, anterior tibial, and posterior tibial arteries, when vessel segments are accessible. Bilateral testing is considered an integral part of a complete examination. Photoelectric Plethysmograph (PPG) waveforms and toe systolic pressure readings are included as required and additional duplex testing as needed. Limited examinations for reoccurring indications may be performed as noted.  ABI Findings: +---------+------------------+-----+--------+--------+ Right    Rt Pressure (mmHg)IndexWaveformComment  +---------+------------------+-----+--------+--------+ Brachial 90                                      +---------+------------------+-----+--------+--------+ ATA      0                 0.00 absent           +---------+------------------+-----+--------+--------+ PTA      0                 0.00 absent           +---------+------------------+-----+--------+--------+ Great Toe0                 0.00 Absent           +---------+------------------+-----+--------+--------+ +---------+------------------+-----+----------+-------+ Left     Lt Pressure (mmHg)IndexWaveform  Comment  +---------+------------------+-----+----------+-------+ Brachial 87                                       +---------+------------------+-----+----------+-------+ ATA      0                 0.00 absent            +---------+------------------+-----+----------+-------+ PTA      91                1.01 monophasic        +---------+------------------+-----+----------+-------+ Great Toe0                 0.00 Absent            +---------+------------------+-----+----------+-------+ +-------+-----------+-----------+------------+------------+ ABI/TBIToday's ABIToday's TBIPrevious ABIPrevious TBI +-------+-----------+-----------+------------+------------+ Right  0.0        0.0                                 +-------+-----------+-----------+------------+------------+ Left   1.01       0                                   +-------+-----------+-----------+------------+------------+  Summary: Right: Resting right ankle-brachial index indicates critical limb ischemia. The right toe-brachial index is abnormal. Left: Resting left ankle-brachial index is within normal range. The left toe-brachial index is abnormal. *See table(s) above for measurements and observations.  Electronically signed by [name] MD on 12/20/2022 at 5:08:56 PM.    Final      Assessment/Plan There are no diagnoses linked to this encounter.    [name], MD  01/05/2023 8:20 PM

## 2023-01-06 ENCOUNTER — Encounter: Payer: Self-pay | Admitting: Intensive Care

## 2023-01-06 ENCOUNTER — Emergency Department: Payer: Medicare Other

## 2023-01-06 ENCOUNTER — Emergency Department
Admission: EM | Admit: 2023-01-06 | Discharge: 2023-01-06 | Disposition: A | Discharge status: Home / Self Care | Payer: Medicare Other | Source: Home / Self Care | Attending: Emergency Medicine | Admitting: Emergency Medicine

## 2023-01-06 ENCOUNTER — Other Ambulatory Visit: Payer: Self-pay

## 2023-01-06 DIAGNOSIS — Z992 Dependence on renal dialysis: Secondary | ICD-10-CM | POA: Insufficient documentation

## 2023-01-06 DIAGNOSIS — R079 Chest pain, unspecified: Secondary | ICD-10-CM | POA: Insufficient documentation

## 2023-01-06 DIAGNOSIS — N186 End stage renal disease: Secondary | ICD-10-CM | POA: Insufficient documentation

## 2023-01-06 DIAGNOSIS — K264 Chronic or unspecified duodenal ulcer with hemorrhage: Secondary | ICD-10-CM | POA: Diagnosis not present

## 2023-01-06 DIAGNOSIS — M79661 Pain in right lower leg: Secondary | ICD-10-CM | POA: Insufficient documentation

## 2023-01-06 DIAGNOSIS — I120 Hypertensive chronic kidney disease with stage 5 chronic kidney disease or end stage renal disease: Secondary | ICD-10-CM | POA: Insufficient documentation

## 2023-01-06 DIAGNOSIS — J449 Chronic obstructive pulmonary disease, unspecified: Secondary | ICD-10-CM | POA: Insufficient documentation

## 2023-01-06 DIAGNOSIS — E875 Hyperkalemia: Secondary | ICD-10-CM | POA: Diagnosis not present

## 2023-01-06 DIAGNOSIS — R0602 Shortness of breath: Secondary | ICD-10-CM | POA: Insufficient documentation

## 2023-01-06 LAB — CBC WITH DIFFERENTIAL/PLATELET
Abs Immature Granulocytes: 0.07 10*3/uL (ref 0.00–0.07)
Basophils Absolute: 0 10*3/uL (ref 0.0–0.1)
Basophils Relative: 1 %
Eosinophils Absolute: 0.2 10*3/uL (ref 0.0–0.5)
Eosinophils Relative: 2 %
HCT: 40.9 % (ref 39.0–52.0)
Hemoglobin: 12.3 g/dL — ABNORMAL LOW (ref 13.0–17.0)
Immature Granulocytes: 1 %
Lymphocytes Relative: 6 %
Lymphs Abs: 0.4 10*3/uL — ABNORMAL LOW (ref 0.7–4.0)
MCH: 28.2 pg (ref 26.0–34.0)
MCHC: 30.1 g/dL (ref 30.0–36.0)
MCV: 93.8 fL (ref 80.0–100.0)
Monocytes Absolute: 0.5 10*3/uL (ref 0.1–1.0)
Monocytes Relative: 7 %
Neutro Abs: 5.7 10*3/uL (ref 1.7–7.7)
Neutrophils Relative %: 83 %
Platelets: 225 10*3/uL (ref 150–400)
RBC: 4.36 MIL/uL (ref 4.22–5.81)
RDW: 17.8 % — ABNORMAL HIGH (ref 11.5–15.5)
WBC: 6.8 10*3/uL (ref 4.0–10.5)
nRBC: 0 % (ref 0.0–0.2)

## 2023-01-06 LAB — COMPREHENSIVE METABOLIC PANEL
ALT: 19 U/L (ref 0–44)
AST: 21 U/L (ref 15–41)
Albumin: 2.6 g/dL — ABNORMAL LOW (ref 3.5–5.0)
Alkaline Phosphatase: 41 U/L (ref 38–126)
Anion gap: 10 (ref 5–15)
BUN: 68 mg/dL — ABNORMAL HIGH (ref 8–23)
CO2: 27 mmol/L (ref 22–32)
Calcium: 8 mg/dL — ABNORMAL LOW (ref 8.9–10.3)
Chloride: 101 mmol/L (ref 98–111)
Creatinine, Ser: 3.86 mg/dL — ABNORMAL HIGH (ref 0.61–1.24)
GFR, Estimated: 17 mL/min — ABNORMAL LOW (ref >60)
Glucose, Bld: 92 mg/dL (ref 70–99)
Potassium: 3.9 mmol/L (ref 3.5–5.1)
Sodium: 138 mmol/L (ref 135–145)
Total Bilirubin: 0.3 mg/dL (ref <1.2)
Total Protein: 5.4 g/dL — ABNORMAL LOW (ref 6.5–8.1)

## 2023-01-06 LAB — TROPONIN I (HIGH SENSITIVITY)
Troponin I (High Sensitivity): 47 ng/L — ABNORMAL HIGH (ref <18)
Troponin I (High Sensitivity): 55 ng/L — ABNORMAL HIGH (ref <18)

## 2023-01-06 MED ORDER — MORPHINE SULFATE (PF) 4 MG/ML IV SOLN
4.0000 mg | Freq: Once | INTRAVENOUS | Status: AC
Start: 2023-01-06 — End: 2023-01-06
  Administered 2023-01-06: 4 mg via INTRAVENOUS
  Filled 2023-01-06: qty 1

## 2023-01-06 NOTE — ED Notes (Signed)
Pt given food tray and gingerale per pt's request and MD's direction.

## 2023-01-06 NOTE — ED Provider Notes (Signed)
[facility] Provider Note    Event Date/Time   First MD Initiated Contact with Patient 01/06/23 [phone]     (approximate)   History   Chief Complaint: Shortness of Breath and Leg Pain   HPI  Anthony Hess is a 67 y.o. male with a history of hypertension, atrial fibrillation, ESRD on hemodialysis through right tunneled IJ catheter, COPD who comes to the ED complaining of shortness of breath that started during his routine dialysis treatment today.  Denies chest pain.  He is on Eliquis.  Shortness of breath is constant, waxing waning, no aggravating or alleviating factors.  Denies fever or cough.  No recent exertional symptoms.  Also complains of pain in his right calf.    Outside records reviewed noting patient had angiogram right lower extremity on 12/23/2022 due to PAD with resting pain.  He had angioplasty and stent placement to the right SFA and popliteal arteries.  Otherwise patient has been in his usual state of health, normal oral intake, compliant with medications, no vomiting or diarrhea.          Physical Exam   Triage Vital Signs: ED Triage Vitals  Encounter Vitals Group     BP 01/06/23 0935 (!) 82/60     Systolic BP Percentile --      Diastolic BP Percentile --      Pulse Rate 01/06/23 0935 83     Resp 01/06/23 0935 (!) 22     Temp 01/06/23 0935 (!) 97.5 F (36.4 C)     Temp Source 01/06/23 0935 Oral     SpO2 01/06/23 0935 100 %     Weight 01/06/23 0931 126 lb (57.2 kg)     Height 01/06/23 0931 5\' 2"  (1.575 m)     Head Circumference --      Peak Flow --      Pain Score 01/06/23 0931 5     Pain Loc --      Pain Education --      Exclude from Growth Chart --     Most recent vital signs: Vitals:   01/06/23 1300 01/06/23 1330  BP: 94/76 92/73  Pulse: 85 78  Resp: 19 14  Temp:    SpO2: 99% 100%    General: Awake, no distress.  CV:  Good peripheral perfusion.  Regular rate and rhythm Resp:  Normal effort.  Clear to  auscultation bilaterally Abd:  No distention.  Soft nontender Other:  1+ edema of right lower extremity, no edema left lower extremity.  Asymmetric calf circumference.  Slight hazy erythema of the right calf, tenderness of the right calf.   ED Results / Procedures / Treatments   Labs (all labs ordered are listed, but only abnormal results are displayed) Labs Reviewed  CBC WITH DIFFERENTIAL/PLATELET - Abnormal; Notable for the following components:      Result Value   Hemoglobin 12.3 (*)    RDW 17.8 (*)    Lymphs Abs 0.4 (*)    All other components within normal limits  COMPREHENSIVE METABOLIC PANEL - Abnormal; Notable for the following components:   BUN 68 (*)    Creatinine, Ser 3.86 (*)    Calcium 8.0 (*)    Total Protein 5.4 (*)    Albumin 2.6 (*)    GFR, Estimated 17 (*)    All other components within normal limits  TROPONIN I (HIGH SENSITIVITY) - Abnormal; Notable for the following components:   Troponin I (High Sensitivity) 47 (*)  All other components within normal limits  TROPONIN I (HIGH SENSITIVITY) - Abnormal; Notable for the following components:   Troponin I (High Sensitivity) 55 (*)    All other components within normal limits     EKG Interpreted by me Normal sinus rhythm rate of 79.  Left axis, right bundle branch block.  No acute ischemic changes.   RADIOLOGY Chest x-ray interpreted by me, appears normal.  Radiology report reviewed  Ultrasound right lower extremity negative for DVT  PROCEDURES:  Procedures   MEDICATIONS ORDERED IN ED: Medications  morphine (PF) 4 MG/ML injection 4 mg (4 mg Intravenous Given 01/06/23 1038)     IMPRESSION / MDM / ASSESSMENT AND PLAN / ED COURSE  I reviewed the triage vital signs and the nursing notes.  DDx: Pneumonia, pleural effusion, pneumothorax, pulmonary edema, electrolyte abnormality, anemia, pulmonary embolism, non-STEMI  Patient's presentation is most consistent with acute presentation with potential  threat to life or bodily function.  Patient presents with shortness of breath which started during his dialysis session.  Exam notable for right calf swelling and tenderness.  Will obtain ultrasound to evaluate for DVT.  Vital signs notable for initial hypotension.  Will continue monitoring pending workup.   Clinical Course as of 01/06/23 1450  Thu Jan 06, 2023  1449 Symptoms resolved. Repeat trop flat. Eating, feeling well.  [PS]    Clinical Course User Index [PS] Sharman Cheek, MD     FINAL CLINICAL IMPRESSION(S) / ED DIAGNOSES   Final diagnoses:  Nonspecific chest pain  ESRD on hemodialysis [facility])     Rx / DC Orders   ED Discharge Orders     None        Note:  This document was prepared using Dragon voice recognition software and may include unintentional dictation errors.   Sharman Cheek, MD 01/06/23 [id number]

## 2023-01-06 NOTE — ED Notes (Signed)
Patient discharged from ED by provider. Patient wheeled from ED in NAD

## 2023-01-06 NOTE — ED Triage Notes (Signed)
First Nurse Note  [facility]. Pt c/o R leg pain and swelling, reports he had a stent placed few weeks ago. R pedal pulses present per EMS. Pt does take Eliquis. Pt is A&Ox4 and NAD 70 HR 97% ON 115/69 135 cbg

## 2023-01-06 NOTE — ED Notes (Signed)
Order placed for IV team due to patient being a difficult stick

## 2023-01-06 NOTE — ED Triage Notes (Signed)
Patient arrived by EMS from Dialysis after SOB started during treatment. Patient reports he did not finish dialysis and unsure how much he got through.   Patient had stent placed X2 weeks ago and c/o right leg pain. Reports he has been to follow up appointments and no abnormal findings  Takes eliquis

## 2023-01-06 NOTE — ED Notes (Signed)
CCMD called when pt placed on monitor

## 2023-01-08 ENCOUNTER — Emergency Department: Payer: Medicare Other

## 2023-01-08 ENCOUNTER — Other Ambulatory Visit: Payer: Self-pay

## 2023-01-08 ENCOUNTER — Inpatient Hospital Stay: Payer: Medicare Other

## 2023-01-08 ENCOUNTER — Inpatient Hospital Stay
Admission: EM | Admit: 2023-01-08 | Discharge: 2023-01-13 | DRG: 377 | Disposition: A | Discharge status: Home Health | Payer: Medicare Other | Source: Other Acute Inpatient Hospital | Attending: Internal Medicine | Admitting: Internal Medicine

## 2023-01-08 DIAGNOSIS — Z992 Dependence on renal dialysis: Secondary | ICD-10-CM | POA: Diagnosis not present

## 2023-01-08 DIAGNOSIS — D6832 Hemorrhagic disorder due to extrinsic circulating anticoagulants: Secondary | ICD-10-CM | POA: Diagnosis present

## 2023-01-08 DIAGNOSIS — E872 Acidosis, unspecified: Secondary | ICD-10-CM | POA: Diagnosis not present

## 2023-01-08 DIAGNOSIS — I25810 Atherosclerosis of coronary artery bypass graft(s) without angina pectoris: Secondary | ICD-10-CM | POA: Diagnosis present

## 2023-01-08 DIAGNOSIS — I151 Hypertension secondary to other renal disorders: Secondary | ICD-10-CM | POA: Diagnosis present

## 2023-01-08 DIAGNOSIS — Z95820 Peripheral vascular angioplasty status with implants and grafts: Secondary | ICD-10-CM

## 2023-01-08 DIAGNOSIS — N2581 Secondary hyperparathyroidism of renal origin: Secondary | ICD-10-CM | POA: Diagnosis present

## 2023-01-08 DIAGNOSIS — E876 Hypokalemia: Secondary | ICD-10-CM | POA: Diagnosis not present

## 2023-01-08 DIAGNOSIS — I5030 Unspecified diastolic (congestive) heart failure: Secondary | ICD-10-CM | POA: Diagnosis present

## 2023-01-08 DIAGNOSIS — I2489 Other forms of acute ischemic heart disease: Secondary | ICD-10-CM | POA: Diagnosis not present

## 2023-01-08 DIAGNOSIS — I420 Dilated cardiomyopathy: Secondary | ICD-10-CM | POA: Diagnosis present

## 2023-01-08 DIAGNOSIS — J438 Other emphysema: Secondary | ICD-10-CM | POA: Diagnosis present

## 2023-01-08 DIAGNOSIS — I214 Non-ST elevation (NSTEMI) myocardial infarction: Secondary | ICD-10-CM | POA: Diagnosis not present

## 2023-01-08 DIAGNOSIS — D649 Anemia, unspecified: Secondary | ICD-10-CM | POA: Diagnosis present

## 2023-01-08 DIAGNOSIS — D62 Acute posthemorrhagic anemia: Secondary | ICD-10-CM | POA: Diagnosis not present

## 2023-01-08 DIAGNOSIS — D631 Anemia in chronic kidney disease: Secondary | ICD-10-CM | POA: Diagnosis present

## 2023-01-08 DIAGNOSIS — K264 Chronic or unspecified duodenal ulcer with hemorrhage: Secondary | ICD-10-CM | POA: Diagnosis present

## 2023-01-08 DIAGNOSIS — I2582 Chronic total occlusion of coronary artery: Secondary | ICD-10-CM | POA: Diagnosis present

## 2023-01-08 DIAGNOSIS — K922 Gastrointestinal hemorrhage, unspecified: Principal | ICD-10-CM | POA: Insufficient documentation

## 2023-01-08 DIAGNOSIS — E782 Mixed hyperlipidemia: Secondary | ICD-10-CM | POA: Diagnosis present

## 2023-01-08 DIAGNOSIS — Z8616 Personal history of COVID-19: Secondary | ICD-10-CM

## 2023-01-08 DIAGNOSIS — Z01818 Encounter for other preprocedural examination: Secondary | ICD-10-CM | POA: Diagnosis not present

## 2023-01-08 DIAGNOSIS — Z8249 Family history of ischemic heart disease and other diseases of the circulatory system: Secondary | ICD-10-CM

## 2023-01-08 DIAGNOSIS — T45515A Adverse effect of anticoagulants, initial encounter: Secondary | ICD-10-CM | POA: Diagnosis present

## 2023-01-08 DIAGNOSIS — Z79899 Other long term (current) drug therapy: Secondary | ICD-10-CM

## 2023-01-08 DIAGNOSIS — I739 Peripheral vascular disease, unspecified: Secondary | ICD-10-CM | POA: Diagnosis present

## 2023-01-08 DIAGNOSIS — D72829 Elevated white blood cell count, unspecified: Secondary | ICD-10-CM | POA: Diagnosis present

## 2023-01-08 DIAGNOSIS — K219 Gastro-esophageal reflux disease without esophagitis: Secondary | ICD-10-CM | POA: Diagnosis present

## 2023-01-08 DIAGNOSIS — N186 End stage renal disease: Secondary | ICD-10-CM | POA: Diagnosis present

## 2023-01-08 DIAGNOSIS — E875 Hyperkalemia: Secondary | ICD-10-CM | POA: Diagnosis present

## 2023-01-08 DIAGNOSIS — Z7901 Long term (current) use of anticoagulants: Secondary | ICD-10-CM

## 2023-01-08 DIAGNOSIS — I5042 Chronic combined systolic (congestive) and diastolic (congestive) heart failure: Secondary | ICD-10-CM | POA: Diagnosis present

## 2023-01-08 DIAGNOSIS — Z8551 Personal history of malignant neoplasm of bladder: Secondary | ICD-10-CM

## 2023-01-08 DIAGNOSIS — K449 Diaphragmatic hernia without obstruction or gangrene: Secondary | ICD-10-CM | POA: Diagnosis present

## 2023-01-08 DIAGNOSIS — R578 Other shock: Secondary | ICD-10-CM | POA: Insufficient documentation

## 2023-01-08 DIAGNOSIS — K2970 Gastritis, unspecified, without bleeding: Secondary | ICD-10-CM | POA: Diagnosis present

## 2023-01-08 DIAGNOSIS — I252 Old myocardial infarction: Secondary | ICD-10-CM

## 2023-01-08 DIAGNOSIS — Z87442 Personal history of urinary calculi: Secondary | ICD-10-CM

## 2023-01-08 DIAGNOSIS — Z681 Body mass index (BMI) 19 or less, adult: Secondary | ICD-10-CM | POA: Diagnosis not present

## 2023-01-08 DIAGNOSIS — I21A1 Myocardial infarction type 2: Secondary | ICD-10-CM | POA: Diagnosis present

## 2023-01-08 DIAGNOSIS — Z5982 Transportation insecurity: Secondary | ICD-10-CM

## 2023-01-08 DIAGNOSIS — I482 Chronic atrial fibrillation, unspecified: Secondary | ICD-10-CM | POA: Diagnosis not present

## 2023-01-08 DIAGNOSIS — Z7982 Long term (current) use of aspirin: Secondary | ICD-10-CM

## 2023-01-08 DIAGNOSIS — E440 Moderate protein-calorie malnutrition: Secondary | ICD-10-CM | POA: Diagnosis present

## 2023-01-08 DIAGNOSIS — I132 Hypertensive heart and chronic kidney disease with heart failure and with stage 5 chronic kidney disease, or end stage renal disease: Secondary | ICD-10-CM | POA: Diagnosis present

## 2023-01-08 DIAGNOSIS — T39395A Adverse effect of other nonsteroidal anti-inflammatory drugs [NSAID], initial encounter: Secondary | ICD-10-CM | POA: Diagnosis present

## 2023-01-08 DIAGNOSIS — R0602 Shortness of breath: Secondary | ICD-10-CM

## 2023-01-08 DIAGNOSIS — K3189 Other diseases of stomach and duodenum: Secondary | ICD-10-CM | POA: Diagnosis present

## 2023-01-08 DIAGNOSIS — I2510 Atherosclerotic heart disease of native coronary artery without angina pectoris: Secondary | ICD-10-CM | POA: Diagnosis present

## 2023-01-08 DIAGNOSIS — Z86711 Personal history of pulmonary embolism: Secondary | ICD-10-CM

## 2023-01-08 DIAGNOSIS — Z87891 Personal history of nicotine dependence: Secondary | ICD-10-CM

## 2023-01-08 LAB — CBC WITH DIFFERENTIAL/PLATELET
Abs Immature Granulocytes: 0.3 10*3/uL — ABNORMAL HIGH (ref 0.00–0.07)
Abs Immature Granulocytes: 0.31 10*3/uL — ABNORMAL HIGH (ref 0.00–0.07)
Abs Immature Granulocytes: 0.27 10*3/uL — ABNORMAL HIGH (ref 0.00–0.07)
Basophils Absolute: 0 10*3/uL (ref 0.0–0.1)
Basophils Absolute: 0 10*3/uL (ref 0.0–0.1)
Basophils Absolute: 0 10*3/uL (ref 0.0–0.1)
Basophils Relative: 0 %
Basophils Relative: 0 %
Basophils Relative: 0 %
Eosinophils Absolute: 0 10*3/uL (ref 0.0–0.5)
Eosinophils Absolute: 0 10*3/uL (ref 0.0–0.5)
Eosinophils Absolute: 0 10*3/uL (ref 0.0–0.5)
Eosinophils Relative: 0 %
Eosinophils Relative: 0 %
Eosinophils Relative: 0 %
HCT: 14.3 % — CL (ref 39.0–52.0)
HCT: 13.5 % — CL (ref 39.0–52.0)
HCT: 22.2 % — ABNORMAL LOW (ref 39.0–52.0)
Hemoglobin: 4.2 g/dL — CL (ref 13.0–17.0)
Hemoglobin: 4 g/dL — CL (ref 13.0–17.0)
Hemoglobin: 7.7 g/dL — ABNORMAL LOW (ref 13.0–17.0)
Immature Granulocytes: 2 %
Immature Granulocytes: 2 %
Immature Granulocytes: 2 %
Lymphocytes Relative: 7 %
Lymphocytes Relative: 6 %
Lymphocytes Relative: 6 %
Lymphs Abs: 1.3 10*3/uL (ref 0.7–4.0)
Lymphs Abs: 1.1 10*3/uL (ref 0.7–4.0)
Lymphs Abs: 1 10*3/uL (ref 0.7–4.0)
MCH: 29.4 pg (ref 26.0–34.0)
MCH: 29.9 pg (ref 26.0–34.0)
MCH: 30.1 pg (ref 26.0–34.0)
MCHC: 29.4 g/dL — ABNORMAL LOW (ref 30.0–36.0)
MCHC: 29.6 g/dL — ABNORMAL LOW (ref 30.0–36.0)
MCHC: 34.7 g/dL (ref 30.0–36.0)
MCV: 100 fL (ref 80.0–100.0)
MCV: 100.7 fL — ABNORMAL HIGH (ref 80.0–100.0)
MCV: 86.7 fL (ref 80.0–100.0)
Monocytes Absolute: 1.2 10*3/uL — ABNORMAL HIGH (ref 0.1–1.0)
Monocytes Absolute: 1 10*3/uL (ref 0.1–1.0)
Monocytes Absolute: 1.4 10*3/uL — ABNORMAL HIGH (ref 0.1–1.0)
Monocytes Relative: 6 %
Monocytes Relative: 6 %
Monocytes Relative: 8 %
Neutro Abs: 15.6 10*3/uL — ABNORMAL HIGH (ref 1.7–7.7)
Neutro Abs: 14.9 10*3/uL — ABNORMAL HIGH (ref 1.7–7.7)
Neutro Abs: 14.7 10*3/uL — ABNORMAL HIGH (ref 1.7–7.7)
Neutrophils Relative %: 85 %
Neutrophils Relative %: 86 %
Neutrophils Relative %: 84 %
Platelets: 505 10*3/uL — ABNORMAL HIGH (ref 150–400)
Platelets: 519 10*3/uL — ABNORMAL HIGH (ref 150–400)
Platelets: 311 10*3/uL (ref 150–400)
RBC: 1.43 MIL/uL — ABNORMAL LOW (ref 4.22–5.81)
RBC: 1.34 MIL/uL — ABNORMAL LOW (ref 4.22–5.81)
RBC: 2.56 MIL/uL — ABNORMAL LOW (ref 4.22–5.81)
RDW: 19.4 % — ABNORMAL HIGH (ref 11.5–15.5)
RDW: 19.6 % — ABNORMAL HIGH (ref 11.5–15.5)
RDW: 15.9 % — ABNORMAL HIGH (ref 11.5–15.5)
WBC: 18.4 10*3/uL — ABNORMAL HIGH (ref 4.0–10.5)
WBC: 17.4 10*3/uL — ABNORMAL HIGH (ref 4.0–10.5)
WBC: 17.3 10*3/uL — ABNORMAL HIGH (ref 4.0–10.5)
nRBC: 0.2 % (ref 0.0–0.2)
nRBC: 0.2 % (ref 0.0–0.2)
nRBC: 0.3 % — ABNORMAL HIGH (ref 0.0–0.2)

## 2023-01-08 LAB — COMPREHENSIVE METABOLIC PANEL
ALT: 17 U/L (ref 0–44)
AST: 28 U/L (ref 15–41)
Albumin: 2.5 g/dL — ABNORMAL LOW (ref 3.5–5.0)
Alkaline Phosphatase: 45 U/L (ref 38–126)
Anion gap: 18 — ABNORMAL HIGH (ref 5–15)
BUN: 144 mg/dL — ABNORMAL HIGH (ref 8–23)
CO2: 18 mmol/L — ABNORMAL LOW (ref 22–32)
Calcium: 7.6 mg/dL — ABNORMAL LOW (ref 8.9–10.3)
Chloride: 101 mmol/L (ref 98–111)
Creatinine, Ser: 8.03 mg/dL — ABNORMAL HIGH (ref 0.61–1.24)
GFR, Estimated: 7 mL/min — ABNORMAL LOW (ref >60)
Glucose, Bld: 141 mg/dL — ABNORMAL HIGH (ref 70–99)
Potassium: 6.3 mmol/L (ref 3.5–5.1)
Sodium: 137 mmol/L (ref 135–145)
Total Bilirubin: 0.2 mg/dL (ref <1.2)
Total Protein: 5.2 g/dL — ABNORMAL LOW (ref 6.5–8.1)

## 2023-01-08 LAB — PROTIME-INR
INR: 1.2 (ref 0.8–1.2)
Prothrombin Time: 15.7 s — ABNORMAL HIGH (ref 11.4–15.2)

## 2023-01-08 LAB — PREPARE RBC (CROSSMATCH)

## 2023-01-08 LAB — BASIC METABOLIC PANEL
Anion gap: 15 (ref 5–15)
BUN: 44 mg/dL — ABNORMAL HIGH (ref 8–23)
CO2: 24 mmol/L (ref 22–32)
Calcium: 7.6 mg/dL — ABNORMAL LOW (ref 8.9–10.3)
Chloride: 96 mmol/L — ABNORMAL LOW (ref 98–111)
Creatinine, Ser: 3.19 mg/dL — ABNORMAL HIGH (ref 0.61–1.24)
GFR, Estimated: 21 mL/min — ABNORMAL LOW (ref >60)
Glucose, Bld: 96 mg/dL (ref 70–99)
Potassium: 3.2 mmol/L — ABNORMAL LOW (ref 3.5–5.1)
Sodium: 135 mmol/L (ref 135–145)

## 2023-01-08 LAB — MRSA NEXT GEN BY PCR, NASAL: MRSA by PCR Next Gen: NOT DETECTED

## 2023-01-08 LAB — TROPONIN I (HIGH SENSITIVITY)
Troponin I (High Sensitivity): 3803 ng/L (ref <18)
Troponin I (High Sensitivity): 3348 ng/L (ref <18)
Troponin I (High Sensitivity): 9484 ng/L (ref <18)

## 2023-01-08 LAB — GLUCOSE, CAPILLARY: Glucose-Capillary: 112 mg/dL — ABNORMAL HIGH (ref 70–99)

## 2023-01-08 LAB — LIPASE, BLOOD: Lipase: 28 U/L (ref 11–51)

## 2023-01-08 LAB — LACTIC ACID, PLASMA
Lactic Acid, Venous: 5.2 mmol/L (ref 0.5–1.9)
Lactic Acid, Venous: 5.5 mmol/L (ref 0.5–1.9)

## 2023-01-08 LAB — HEPATITIS B SURFACE ANTIGEN: Hepatitis B Surface Ag: NONREACTIVE

## 2023-01-08 MED ORDER — PENTAFLUOROPROP-TETRAFLUOROETH EX AERO
1.0000 | INHALATION_SPRAY | CUTANEOUS | Status: DC | PRN
Start: 2023-01-08 — End: 2023-01-11

## 2023-01-08 MED ORDER — PANTOPRAZOLE SODIUM 40 MG IV SOLR: 40.0000 mg | Freq: Two times a day (BID) | INTRAVENOUS | Status: DC

## 2023-01-08 MED ORDER — HEPARIN SODIUM (PORCINE) 1000 UNIT/ML DIALYSIS
1000.0000 [IU] | INTRAMUSCULAR | Status: DC | PRN
Start: 2023-01-08 — End: 2023-01-11

## 2023-01-08 MED ORDER — MORPHINE SULFATE (PF) 2 MG/ML IV SOLN
2.0000 mg | Freq: Once | INTRAVENOUS | Status: AC
  Administered 2023-01-08: 2 mg via INTRAVENOUS
  Filled 2023-01-08: qty 1

## 2023-01-08 MED ORDER — NOREPINEPHRINE 4 MG/250ML-% IV SOLN
2.0000 ug/min | INTRAVENOUS | Status: DC
  Administered 2023-01-08: 2 ug/min via INTRAVENOUS
  Filled 2023-01-08: qty 250

## 2023-01-08 MED ORDER — ALBUTEROL SULFATE (2.5 MG/3ML) 0.083% IN NEBU
10.0000 mg | INHALATION_SOLUTION | Freq: Once | RESPIRATORY_TRACT | Status: AC
Start: 2023-01-08 — End: 2023-01-08
  Administered 2023-01-08: 10 mg via RESPIRATORY_TRACT
  Filled 2023-01-08: qty 12

## 2023-01-08 MED ORDER — ORAL CARE MOUTH RINSE: 15.0000 mL | OROMUCOSAL | Status: DC | PRN

## 2023-01-08 MED ORDER — LIDOCAINE-PRILOCAINE 2.5-2.5 % EX CREA
1.0000 | TOPICAL_CREAM | CUTANEOUS | Status: DC | PRN
Start: 2023-01-08 — End: 2023-01-11

## 2023-01-08 MED ORDER — LIDOCAINE HCL (PF) 1 % IJ SOLN
5.0000 mL | INTRAMUSCULAR | Status: DC | PRN
Start: 2023-01-08 — End: 2023-01-11

## 2023-01-08 MED ORDER — PANTOPRAZOLE SODIUM 40 MG IV SOLR
40.0000 mg | Freq: Once | INTRAVENOUS | Status: AC
  Administered 2023-01-08: 40 mg via INTRAVENOUS
  Filled 2023-01-08: qty 10

## 2023-01-08 MED ORDER — SODIUM CHLORIDE 0.9 % IV SOLN: 250.0000 mL | INTRAVENOUS | Status: AC

## 2023-01-08 MED ORDER — CHLORHEXIDINE GLUCONATE CLOTH 2 % EX PADS
6.0000 | MEDICATED_PAD | Freq: Every day | CUTANEOUS | Status: DC
  Administered 2023-01-09 – 2023-01-13 (×3): 6 via TOPICAL

## 2023-01-08 MED ORDER — SODIUM CHLORIDE 0.9% IV SOLUTION: Freq: Once | INTRAVENOUS | Status: DC

## 2023-01-08 MED ORDER — ALTEPLASE 2 MG IJ SOLR
2.0000 mg | Freq: Once | INTRAMUSCULAR | Status: DC | PRN
Start: 2023-01-08 — End: 2023-01-11

## 2023-01-08 MED ORDER — ANTICOAGULANT SODIUM CITRATE 4% (200MG/5ML) IV SOLN: 5.0000 mL | Status: DC | PRN

## 2023-01-08 MED ORDER — SODIUM CHLORIDE 0.9 % IV SOLN
20.0000 ug | Freq: Once | INTRAVENOUS | Status: DC
  Filled 2023-01-08: qty 5

## 2023-01-08 MED ORDER — SODIUM ZIRCONIUM CYCLOSILICATE 5 G PO PACK
10.0000 g | PACK | Freq: Once | ORAL | Status: DC
  Filled 2023-01-08: qty 1

## 2023-01-08 MED ORDER — INSULIN ASPART 100 UNIT/ML IV SOLN
10.0000 [IU] | Freq: Once | INTRAVENOUS | Status: AC
  Administered 2023-01-08: 10 [IU] via INTRAVENOUS
  Filled 2023-01-08: qty 0.1

## 2023-01-08 MED ORDER — DEXTROSE 50 % IV SOLN
1.0000 | Freq: Once | INTRAVENOUS | Status: AC
  Administered 2023-01-08: 50 mL via INTRAVENOUS
  Filled 2023-01-08: qty 50

## 2023-01-08 MED ORDER — IOHEXOL 350 MG/ML SOLN
100.0000 mL | Freq: Once | INTRAVENOUS | Status: AC | PRN
  Administered 2023-01-08: 75 mL via INTRAVENOUS

## 2023-01-08 MED ORDER — CALCIUM GLUCONATE 10 % IV SOLN
1.0000 g | Freq: Once | INTRAVENOUS | Status: AC
  Administered 2023-01-08: 1 g via INTRAVENOUS
  Filled 2023-01-08: qty 10

## 2023-01-08 MED ORDER — PANTOPRAZOLE SODIUM 40 MG IV SOLR
40.0000 mg | Freq: Four times a day (QID) | INTRAVENOUS | Status: AC
  Administered 2023-01-08 – 2023-01-11 (×10): 40 mg via INTRAVENOUS
  Filled 2023-01-08 (×10): qty 10

## 2023-01-08 MED ORDER — NEPRO/CARBSTEADY PO LIQD: 237.0000 mL | ORAL | Status: DC | PRN

## 2023-01-08 NOTE — Progress Notes (Signed)
Received patient in bed  Alert and oriented.  Informed consent signed and in chart.   TX duration:3.5 hours extended to facilitate the blood transfusion.  Patient tolerated well.   Alert, without acute distress.  Hand-off given to patient's nurse.   Access used: dialysis cath Access issues: none  Total UF removed: 500 Medication(s) given: prbcx3, heplock 1.6cc per port Post HD VS: see table below Post HD weight: 53.1kg    01/08/23 2129  Vitals  Temp 99.5 F (37.5 C)  Temp Source Core  BP 113/79  MAP (mmHg) 81  BP Location Left Arm  BP Method Automatic  Patient Position (if appropriate) Lying  Pulse Rate (!) 42  Pulse Rate Source Monitor  ECG Heart Rate 84  Resp 18  Oxygen Therapy  SpO2 100 %  O2 Device Room Air  Patient Activity (if Appropriate) In bed  Pulse Oximetry Type Continuous  During Treatment Monitoring  Blood Flow Rate (mL/min) 0 mL/min  Arterial Pressure (mmHg) -461.8 mmHg  Venous Pressure (mmHg) -475.13 mmHg  TMP (mmHg) 6.66 mmHg  Ultrafiltration Rate (mL/min) 439 mL/min  Dialysate Flow Rate (mL/min) 299 ml/min  Dialysate Potassium Concentration 1  Dialysate Calcium Concentration 2.5  Duration of HD Treatment -hour(s) 3.58 hour(s)  Cumulative Fluid Removed (mL) per Treatment  517.22  HD Safety Checks Performed Yes  Intra-Hemodialysis Comments Tolerated well  Post Treatment  Dialyzer Clearance Heavily streaked  Liters Processed 86  Fluid Removed (mL) 500 mL  Tolerated HD Treatment Yes  Hemodialysis Catheter Right Internal jugular Double lumen Permanent (Tunneled)  No placement date or time found.   Serial / Lot #: [id number]  Expiration Date: 05/23/27  Time Out: Correct patient;Correct site;Correct procedure  Maximum sterile barrier precautions: Hand hygiene;Cap;Mask;Sterile gown;Sterile gloves;Large sterile she...  Site Condition No complications  Blue Lumen Status Flushed;Heparin locked  Red Lumen Status Flushed;Heparin locked  Purple Lumen  Status N/A  Catheter fill solution Heparin 1000 units/ml  Catheter fill volume (Arterial) 1.6 cc  Catheter fill volume (Venous) 1.6  Post treatment catheter status Capped and Clamped      [facility]

## 2023-01-08 NOTE — Consult Note (Signed)
Critical Care Consult  HPI: This is a 67 year old male with a history of end-stage renal disease on hemodialysis, recent percutaneous transluminal angioplasty of the right posterior tibial artery, tibial peroneal trunk, right SFA and popliteal arteries with stent placement on 12/23/2022, and history of pulmonary embolism who presented to the ED with complaints of shortness of breath and dark tarry stools for 2 days.  He was on Eliquis and reports taking it up to 48 hours prior to ED presentation.  He also reports taking 2 tablets of ibuprofen 800 mg daily.  Patient states that he was never advised against taking ibuprofen.  Patient's workup revealed hypokalemia with a potassium level of 6.3, troponin level of 3348, lactic acid of 5.2, hemoglobin of 4.2, hematocrit of 14.3, WBC of 18.4 and a platelet level of 505K/UL.    Patient was receiving emergency dialysis ED as well as emergency block transfusion when he became hypotensive requiring pressors.  He was transferred to the ICU for further management.  PCCM was consulted to assist with patient's management.  Patient is currently completing his dialysis treatment.  He has received 2 units of packed red blood cells and has a third 1 pending.  He also has a unit of fresh frozen plasma pending.  He is fully awake and denies pain.  Reports feeling tired and short of breath. He currently has to bear hugger and warm for a temperature of 95.9 F and complains of shivering.  Oxygen saturation is stable on room air.  He is on 2 mcg of Levophed and his mean arterial blood pressure is currently 79 mmHg  ED course Abnormal findings: Hemoglobin 4.2, hematocrit 14.3, WBC 18.4, platelets 455, troponin 3348, lactic acid 5.2 and potassium 6.3 Treatments: 2 units of packed red blood cells, Protonix 80 mg IV bolus and then 40 mg IV every 6 hours and norepinephrine currently at 2 mcg Consults:  Cardiology for elevated troponins-deemed to be due to demand ischemia nephrology:  Hyperkalemia and the need for for emergent dialysis Gastroenterology: Acute GI bleed due to anticoagulation induced coagulopathy   Patient Active Problem List   Diagnosis Date Noted   Severe anemia 01/08/2023   Atherosclerosis of native arteries of extremity with rest pain (HCC) 01/05/2023   Ischemic leg pain 12/31/2022   Limb ischemia 12/21/2022   COPD with acute exacerbation (HCC) 11/16/2022   Paroxysmal atrial fibrillation (HCC) 11/16/2022   Problem with vascular access 10/22/2022   Dyspnea 10/15/2022   GERD without esophagitis 10/15/2022   ESRD on hemodialysis (HCC) 09/17/2022   Acute on chronic diastolic CHF (congestive heart failure) (HCC) 09/17/2022   History of pulmonary embolism 09/17/2022   Coronary artery disease 09/17/2022   Essential hypertension 09/17/2022   Acute respiratory failure with hypoxia (HCC) 09/06/2022   Fluid overload 09/06/2022   Hypertensive emergency 09/06/2022   HLD (hyperlipidemia) 09/06/2022   CAD (coronary artery disease) 09/06/2022   Pulmonary embolism (HCC) 09/06/2022   NSTEMI (non-ST elevated myocardial infarction) (HCC) 08/17/2022   (HFpEF) heart failure with preserved ejection fraction (HCC) 08/17/2022   Seasonal allergic rhinitis due to pollen 06/25/2022   Nausea 06/25/2022   Hoarseness of voice 06/25/2022   S/P exploratory laparotomy for perforated duodenum with admission 04/26/22 05/04/2022   Postprocedural intraabdominal abscess (HCC) 05/04/2022   Acute pulmonary embolism (HCC) 04/19/2022   Dyslipidemia 04/19/2022   End-stage renal disease on hemodialysis (HCC) 04/19/2022   Tobacco dependence 04/19/2022   Chronic systolic heart failure (HCC) 04/05/2022   Hypertensive urgency 11/16/2021   Hyperkalemia 11/16/2021  Chronic back pain 11/16/2021   Hypertension secondary to other renal disorders 10/15/2021   Vitamin B12 deficiency anemia due to intrinsic factor deficiency 06/23/2021   Abdominal pain    SBP (spontaneous bacterial  peritonitis) (HCC) 04/08/2021   COVID-19 04/08/2021   AAA (abdominal aortic aneurysm) (HCC) 03/18/2021   Hypocalcemia    Leukocytosis 03/05/2021   Spontaneous bacterial peritonitis (HCC) 03/04/2021   Incisional hernia, without obstruction or gangrene    Generalized (acute) peritonitis (HCC) 12/10/2020   Iron deficiency anemia, unspecified 05/06/2020   Moderate protein-calorie malnutrition (HCC) 05/06/2020   Other long term (current) drug therapy 05/06/2020   Unspecified jaundice 05/06/2020   PD catheter dysfunction (HCC) 04/13/2020   Bilateral carotid artery stenosis 02/29/2020   Coronary artery disease involving coronary bypass graft of native heart 02/29/2020   Secondary hyperparathyroidism of renal origin (HCC) 01/30/2020   Acute peritonitis (HCC) 01/08/2020   Hyperlipidemia, mixed 01/04/2020   Mass of left side of neck 01/04/2020   Senile purpura (HCC) 01/04/2020   Hydroureteronephrosis    Atrial fibrillation, chronic (HCC) 01/05/2019   End stage renal disease (HCC) 01/05/2019   Anemia in chronic kidney disease (CODE) 01/05/2019   Presence of aortocoronary bypass graft 12/27/2018   Other emphysema (HCC) 04/14/2018   Bilateral hydronephrosis 04/03/2018   Hypertension 03/27/2018   Cigarette smoker 03/20/2018   History of bladder cancer 03/20/2018   Past Medical History:  Diagnosis Date   Aortic atherosclerosis (HCC)    Bilateral carotid artery disease (HCC)    Bladder cancer (HCC)    Coronary artery disease 12/20/2018   a.) LHC 12/20/2018: 50% OM1, 40% OM2, 95% o-pLAD, 75% o=pLCx, 40% mLM, 70% D1, 60% mRCA-1, 50% mRCA-2; refer to CVTS. b.) 4v CABG at [facility] on 12/27/2018: LIMA-LAD, RIMA-PDA, seg LRA-OM1-D1   DCM (dilated cardiomyopathy) (HCC) 12/05/2018   a.) TTE 12/05/2018: EF 40-45%. b.) TTE 12/28/2019: EF 20-25%.   Dyspnea 10/15/2022   ESRD (end stage renal disease) (HCC)    a.) T-Th-Sat   HFrEF (heart failure with reduced ejection fraction) (HCC) 12/05/2018   a.) TTE  12/05/2018: EF 40-45%; mild LVH; ant/apical/sep HK; mild TR . b.) TTE 12/28/2019: EF 20-25%; mod LVH; mod MR/AR; G1DD.   History of 2019 novel coronavirus disease (COVID-19) 04/08/2021   History of kidney stones    HLD (hyperlipidemia)    Hx of CABG 12/27/2018   Hypertension    Infrarenal abdominal aortic aneurysm (AAA) without rupture (HCC) 03/05/2021   a.) CT abd/pelvis; measured 3.2 cm.   Melena 05/04/2022   Myocardial infarction [facility])    NSTEMI (non-ST elevated myocardial infarction) (HCC) 04/19/2022   Perforation bowel (HCC) 04/26/2022   PVD (peripheral vascular disease) (HCC)    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   a.) LIMA-LAD, RIMA-PDA, sequential LEFT radial artery to OM1 and D1   Sepsis (HCC) 03/14/2021   Wears glasses     Past Surgical History:  Procedure Laterality Date   AV FISTULA PLACEMENT Left 07/30/2021   Procedure: INSERTION OF ARTERIOVENOUS (AV) GORE-TEX GRAFT ARM BRACHIAL ARTERY TO AXILLARY VEIN;  Surgeon: Annice Needy, MD;  Location: [facility] ORS;  Service: Vascular;  Laterality: Left;   CAPD INSERTION N/A 12/31/2019   Procedure: LAPAROSCOPIC INSERTION CONTINUOUS AMBULATORY PERITONEAL DIALYSIS  (CAPD) CATHETER;  Surgeon: Leafy Ro, MD;  Location: [facility] ORS;  Service: General;  Laterality: N/A;   CAPD REMOVAL N/A 04/10/2020   Procedure: LAPAROSCOPIC REVISION OF CONTINUOUS AMBULATORY PERITONEAL DIALYSIS  (CAPD) CATHETER;  Surgeon: Leafy Ro, MD;  Location: [facility]  ORS;  Service: General;  Laterality: N/A;   CORONARY ARTERY BYPASS GRAFT N/A 12/27/2018   Procedure: CORONARY ARTERY BYPASS GRAFTING (CABG) X 4 ON PUMP USING RIGHT & LEFT INTERNAL MAMMARY ARTERY LEFT RADIAL ARTERY ENDOSCOPICALLY HARVESTED;  Surgeon: Linden Dolin, MD;  Location: [facility] OR;  Service: Open Heart Surgery;  Laterality: N/A;   CYSTOSCOPY W/ RETROGRADES Bilateral 05/15/2019   Procedure: CYSTOSCOPY WITH RETROGRADE PYELOGRAM;  Surgeon: Riki Altes, MD;  Location: [facility] ORS;  Service: Urology;  Laterality:  Bilateral;   CYSTOSCOPY WITH BIOPSY N/A 05/15/2019   Procedure: CYSTOSCOPY WITH bladder BIOPSY;  Surgeon: Riki Altes, MD;  Location: [facility] ORS;  Service: Urology;  Laterality: N/A;   DIALYSIS/PERMA CATHETER INSERTION N/A 12/28/2019   Procedure: DIALYSIS/PERMA CATHETER INSERTION;  Surgeon: Annice Needy, MD;  Location: [facility] INVASIVE CV LAB;  Service: Cardiovascular;  Laterality: N/A;   DIALYSIS/PERMA CATHETER INSERTION N/A 03/18/2021   Procedure: DIALYSIS/PERMA CATHETER INSERTION;  Surgeon: Annice Needy, MD;  Location: [facility] IN[facility] CV LAB;  Service: Cardiovascular;  Laterality: N/A;   DIALYSIS/PERMA CATHETER REMOVAL N/A 06/02/2020   Procedure: DIALYSIS/PERMA CATHETER REMOVAL;  Surgeon: Annice Needy, MD;  Location: [facility] IN[facility] CV LAB;  Service: Cardiovascular;  Laterality: N/A;   DIALYSIS/PERMA CATHETER REPAIR N/A 10/22/2022   Procedure: DIALYSIS/PERMA CATHETER REPAIR;  Surgeon: Renford Dills, MD;  Location: [facility] INV[facility] CV LAB;  Service: Cardiovascular;  Laterality: N/A;   EXCHANGE OF A DIALYSIS CATHETER Right 04/10/2020   Procedure: EXCHANGE OF A DIALYSIS CATHETER;  Surgeon: Leafy Ro, MD;  Location: [facility] ORS;  Service: General;  Laterality: Right;   INCISIONAL HERNIA REPAIR  01/20/2021   Procedure: HERNIA REPAIR INCISIONAL;  Surgeon: Henrene Dodge, MD;  Location: [facility] ORS;  Service: General;;   IR IMAGE GUIDED DRAINAGE PERCUT CATH  PERITONEAL RETROPERIT  04/07/2020   LAPAROSCOPY N/A 04/16/2021   Procedure: LAPAROSCOPY DIAGNOSTIC;  Surgeon: Sung Amabile, DO;  Location: [facility] ORS;  Service: General;  Laterality: N/A;   LAPAROTOMY N/A 04/26/2022   Procedure: EXPLORATORY LAPAROTOMY WITH REPAIR OF DUODENAL ULCER;  Surgeon: Carolan Shiver, MD;  Location: [facility] ORS;  Service: General;  Laterality: N/A;   LEFT HEART CATH AND CORONARY ANGIOGRAPHY Left 12/20/2018   Procedure: LEFT HEART CATH AND CORONARY ANGIOGRAPHY;  Surgeon: Marcina Millard, MD;  Location: [facility] INV[facility] CV LAB;   Service: Cardiovascular;  Laterality: Left;   LOWER EXTREMITY ANGIOGRAPHY Right 12/23/2022   Procedure: Lower Extremity Angiography;  Surgeon: Annice Needy, MD;  Location: [facility] LAB;  Service: Cardiovascular;  Laterality: Right;   RADIAL ARTERY HARVEST Left 12/27/2018   Procedure: ENDOSCOPIC RADIAL ARTERY HARVEST;  Surgeon: Linden Dolin, MD;  Location: [facility] OR;  Service: Open Heart Surgery;  Laterality: Left;   REMOVAL OF A DIALYSIS CATHETER Left 03/20/2021   Procedure: REMOVAL OF A PD CATHETER;  Surgeon: Annice Needy, MD;  Location: [facility] ORS;  Service: Vascular;  Laterality: Left;   REVISION OF ARTERIOVENOUS GORETEX GRAFT Left 09/11/2021   Procedure: Excisionof infected AV graft;  Surgeon: Renford Dills, MD;  Location: [facility] ORS;  Service: Vascular;  Laterality: Left;   TEE WITHOUT CARDIOVERSION N/A 12/27/2018   Procedure: TRANSESOPHAGEAL ECHOCARDIOGRAM (TEE);  Surgeon: Linden Dolin, MD;  Location: [facility] OR;  Service: Open Heart Surgery;  Laterality: N/A;   TUMOR REMOVAL  2019   Bladder    Medications Prior to Admission  Medication Sig Dispense Refill Last Dose   apixaban (ELIQUIS) 5 MG TABS tablet Take 1 tablet (5 mg  total) by mouth 2 (two) times daily. 60 tablet 0 Past Week   aspirin EC 81 MG tablet Take 81 mg by mouth daily.   Past Week   atorvastatin (LIPITOR) 20 MG tablet Take 1 tablet (20 mg total) by mouth daily. 30 tablet 11 Past Week   calcium acetate (PHOSLO) 667 MG capsule Take 1 capsule (667 mg total) by mouth 3 (three) times daily with meals. 30 capsule 0 Past Week   oxyCODONE-acetaminophen (PERCOCET/ROXICET) 5-325 MG tablet Take 1-2 tablets by mouth every 6 (six) hours as needed for severe pain (pain score 7-10). 40 tablet 0 01/07/2023   pantoprazole (PROTONIX) 40 MG tablet Take 1 tablet (40 mg total) by mouth daily. 30 tablet 0 prn at unknown   sevelamer carbonate (RENVELA) 800 MG tablet Take 1,600 mg by mouth 3 (three) times daily.   Past Week   amLODipine  (NORVASC) 2.5 MG tablet Take 1 tablet (2.5 mg total) by mouth daily. (Patient not taking: Reported on 01/08/2023) 30 tablet 0 Not Taking   isosorbide dinitrate (ISORDIL) 10 MG tablet Take 1 tablet (10 mg total) by mouth 3 (three) times daily. (Patient not taking: Reported on 01/08/2023) 90 tablet 0 Not Taking   No Known Allergies  Social History   Tobacco Use   Smoking status: Former    Types: Cigarettes    Start date: 12/2022    Quit date: 02/19/2019    Years since quitting: 3.8   Smokeless tobacco: Never  Substance Use Topics   Alcohol use: Never    Family History  Problem Relation Age of Onset   Heart failure Mother      Review of Systems: Constitutional: No chills.  HENT: Negative for congestion and rhinorrhea.   Eyes: Negative for redness and visual disturbance.  Respiratory: Positive for shortness of breath but denies cough and wheezing.   Cardiovascular: Negative for chest pain and palpitations.  Gastrointestinal: Negative  for nausea , vomiting and abdominal pain and reports loose dark tarry stools  Genitourinary: Negative for dysuria and urgency.  Endocrine: Denies polyuria, polyphagia and heat intolerance Musculoskeletal: Negative for myalgias and arthralgias.  Skin: Negative rash Neurological: Negative for dizziness and headaches    Scheduled Meds:  [START ON 01/09/2023] Chlorhexidine Gluconate Cloth  6 each Topical Q0600   pantoprazole (PROTONIX) IV  40 mg Intravenous Q6H   Followed by   Melene Muller ON 01/11/2023] pantoprazole (PROTONIX) IV  40 mg Intravenous Q12H   sodium zirconium cyclosilicate  10 g Oral Once   Continuous Infusions:  sodium chloride     desmopressin (DDAVP) 20 mcg in sodium chloride 0.9 % 50 mL IVPB     norepinephrine (LEVOPHED) Adult infusion 2 mcg/min (01/08/23 1630)   PRN Meds:  Objective:  Vital signs in last 24 hours: Temp:  [94.6 F (34.8 C)-97.8 F (36.6 C)] 95.9 F (35.5 C) (11/16 1725) Pulse Rate:  [43-96] 96 (11/16  1725) Resp:  [10-29] 18 (11/16 1725) BP: (76-117)/(51-81) 117/74 (11/16 1715) SpO2:  [64 %-100 %] 64 % (11/16 1725) Weight:  [46.6 kg-59 kg] 46.6 kg (11/16 1715)  Intake/Output last 3 shifts: No intake/output data recorded. Intake/Output this shift: No intake/output data recorded.  Vent settings for last 24 hours:    Hemodynamic parameters for last 24 hours:   Constitutional: He is alert oriented to person, and place, but not to time. Appears cachetic, NAD HENT: Normocephalic and atraumatic,  EOM are normal,  Pupils are equal, round, and reactive to light, normal range of motion and  neck is supple; trachea in midline, conjunctivae and oral mucosa pale Cardiovascular: Normal rate and regular rhythm, no murmur, gallop and no friction rub.    Pulmonary/Chest: Effort normal.  Normal work of breathing, bilateral breath sounds without any wheezes or rhonchi Abdominal: Soft, non-distended, no tenderness on palpation,  no guarding, rebound or referred rebound tenderness.  Musculoskeletal: Positive range of motion in upper and lower extremities, no joint deformities.  Neurological: Patient is alert and responding to questions appropriately, no focal deficits skin: Skin is warm and dry Psychiatric: Mood is depressed  Assessment/Plan: Cardiovascular A: Hypovolem Block ic shock secondary to acute blood loss NSTEMI Hyperkalemia with widened QRS complex on EKG H/O CHF H/O HTN P: Titrate norepinephrine to maintain mean arterial blood pressure greater than 65 Administer blood products as ordered Hold all antihypertensives in light of shock Trend troponin and serial EKGs Cardiology following Emergency dialysis as ordered  GASTROINTESTINAL A:  Acute upper GI bleed secondary to anticoagulant use and NSAID use P: Transfuse blood products as ordered Trend hemoglobin and hematocrit GI following Hemoglobin 7.0 Continue Protonix 40 mg IV every 6 hours Hold all anticoagulants and  NSAIDs  Renal A:  End-stage renal disease Hyperkalemia with EKG changes  P: Emergency hemodialysis as ordered Monitor and correct potassium level per nephrology recommendations Post hemodialysis labs Continue norepinephrine for blood pressure support during dialysis  Vascular A:  Right lower extremity ischemia s/p angioplasty with stent placement P: Hold all anticoagulants Vascular following; await recommendations for anticoagulation once bleeding subsides SCDs for DVT prophylaxis Monitor lower remedies for ischemic changes   Best Practice Diet:  Oral Pain/Anxiety/Delirium protocol (if indicated): No VAP protocol (if indicated): Not indicated DVT prophylaxis: SCD's  GI prophylaxis: PPI Glucose control:  SSI No Central venous access:  N/A Arterial line:  N/A Foley:  N/A Mobility:  bed rest  PT consulted: N/A Last date of multidisciplinary goals of care discussion: TBA Code Status:  full code Disposition:ICU for HD and low BPs requiring pressors   Critical Care Time greater than: 45 Minutes Total time spent with patient greater than: 30 Minutes  Belanna Manring S. Tukov ANP-BC Pulmonary and Critical Care Medicine [facility] Pager [phone]  NB: This document was prepared using Dragon voice recognition software and may include unintentional dictation errors.

## 2023-01-08 NOTE — Consult Note (Signed)
[facility] GI Inpatient Consult Note   Jamey Reas, M.D.  Reason for Consult: Melena, profound symptomatic anemia   Attending Requesting Consult: Marcelino Duster, M.D.                                                       Raechel Chute, M.D. (ICU)   History of Present Illness: Anthony Hess is a 67 y.o. male with a 67 year old male with history of end-stage renal disease on hemodialysis, COPD, peripheral vascular disease, hypertension and atrial fibrillation maintained on Eliquis at home.  He says he has not taken the Eliquis in over 48 hours, however.  Patient has been having significant shortness of breath over the last 4 to 5 days along with blackish stools which caused him to stop the Eliquis.  He is currently in hemodialysis in the ICU due to fluid overload and Hyperkalemia. He is alert and is being treated with pressors to help keep his blood pressure up. He has improved substantially with blood transfusion. He claims he only had two moderate "black stools" at home but none since presenting to the hospital early this afternoon. Patient is naive to both upper endoscopy and colonoscopy.  Past Medical History:  Past Medical History:  Diagnosis Date   Aortic atherosclerosis (HCC)    Bilateral carotid artery disease (HCC)    Bladder cancer (HCC)    Coronary artery disease 12/20/2018   a.) LHC 12/20/2018: 50% OM1, 40% OM2, 95% o-pLAD, 75% o=pLCx, 40% mLM, 70% D1, 60% mRCA-1, 50% mRCA-2; refer to CVTS. b.) 4v CABG at [facility] on 12/27/2018: LIMA-LAD, RIMA-PDA, seg LRA-OM1-D1   DCM (dilated cardiomyopathy) (HCC) 12/05/2018   a.) TTE 12/05/2018: EF 40-45%. b.) TTE 12/28/2019: EF 20-25%.   Dyspnea 10/15/2022   ESRD (end stage renal disease) (HCC)    a.) T-Th-Sat   HFrEF (heart failure with reduced ejection fraction) (HCC) 12/05/2018   a.) TTE 12/05/2018: EF 40-45%; mild LVH; ant/apical/sep HK; mild TR . b.) TTE 12/28/2019: EF 20-25%; mod LVH; mod MR/AR; G1DD.   History of  2019 novel coronavirus disease (COVID-19) 04/08/2021   History of kidney stones    HLD (hyperlipidemia)    Hx of CABG 12/27/2018   Hypertension    Infrarenal abdominal aortic aneurysm (AAA) without rupture (HCC) 03/05/2021   a.) CT abd/pelvis; measured 3.2 cm.   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   a.) LIMA-LAD, RIMA-PDA, sequential LEFT radial artery to OM1 and D1   Sepsis (HCC) 03/14/2021   Wears glasses     Problem List: Patient Active Problem List   Diagnosis Date Noted   Severe anemia 01/08/2023   Hemorrhagic shock (HCC) 01/08/2023   ABLA (acute blood loss anemia) 01/08/2023   Upper GI bleed 01/08/2023   Demand ischemia of myocardium (HCC) 01/08/2023   Gastrointestinal hemorrhage 01/08/2023   Atherosclerosis of native arteries of extremity with rest pain (HCC) 01/05/2023   Ischemic leg pain 12/31/2022   Limb ischemia 12/21/2022   COPD with acute exacerbation (HCC) 11/16/2022   Paroxysmal atrial fibrillation (HCC) 11/16/2022   Problem with vascular access 10/22/2022   Dyspnea 10/15/2022   GERD without esophagitis  10/15/2022   ESRD on hemodialysis (HCC) 09/17/2022   Acute on chronic diastolic CHF (congestive heart failure) (HCC) 09/17/2022   History of pulmonary embolism 09/17/2022   Coronary artery disease 09/17/2022   Essential hypertension 09/17/2022   Acute respiratory failure with hypoxia (HCC) 09/06/2022   Fluid overload 09/06/2022   Hypertensive emergency 09/06/2022   HLD (hyperlipidemia) 09/06/2022   CAD (coronary artery disease) 09/06/2022   Pulmonary embolism (HCC) 09/06/2022   NSTEMI (non-ST elevated myocardial infarction) (HCC) 08/17/2022   (HFpEF) heart failure with preserved ejection fraction (HCC) 08/17/2022   Seasonal allergic rhinitis due to pollen 06/25/2022    Nausea 06/25/2022   Hoarseness of voice 06/25/2022   S/P exploratory laparotomy for perforated duodenum with admission 04/26/22 05/04/2022   Postprocedural intraabdominal abscess (HCC) 05/04/2022   Acute pulmonary embolism (HCC) 04/19/2022   Dyslipidemia 04/19/2022   End-stage renal disease on hemodialysis (HCC) 04/19/2022   Tobacco dependence 04/19/2022   Chronic systolic heart failure (HCC) 04/05/2022   Hypertensive urgency 11/16/2021   Hyperkalemia 11/16/2021   Chronic back pain 11/16/2021   Hypertension secondary to other renal disorders 10/15/2021   Vitamin B12 deficiency anemia due to intrinsic factor deficiency 06/23/2021   Abdominal pain    SBP (spontaneous bacterial peritonitis) (HCC) 04/08/2021   COVID-19 04/08/2021   AAA (abdominal aortic aneurysm) (HCC) 03/18/2021   Hypocalcemia    Leukocytosis 03/05/2021   Spontaneous bacterial peritonitis (HCC) 03/04/2021   Incisional hernia, without obstruction or gangrene    Generalized (acute) peritonitis (HCC) 12/10/2020   Iron deficiency anemia, unspecified 05/06/2020   Moderate protein-calorie malnutrition (HCC) 05/06/2020   Other long term (current) drug therapy 05/06/2020   Unspecified jaundice 05/06/2020   PD catheter dysfunction (HCC) 04/13/2020   Bilateral carotid artery stenosis 02/29/2020   Coronary artery disease involving coronary bypass graft of native heart 02/29/2020   Secondary hyperparathyroidism of renal origin (HCC) 01/30/2020   Acute peritonitis (HCC) 01/08/2020   Hyperlipidemia, mixed 01/04/2020   Mass of left side of neck 01/04/2020   Senile purpura (HCC) 01/04/2020   Hydroureteronephrosis    Atrial fibrillation, chronic (HCC) 01/05/2019   End stage renal disease (HCC) 01/05/2019   Anemia in chronic kidney disease (CODE) 01/05/2019   Presence of aortocoronary bypass graft 12/27/2018   Other emphysema (HCC) 04/14/2018   Bilateral hydronephrosis 04/03/2018   Hypertension 03/27/2018   Cigarette smoker  03/20/2018   History of bladder cancer 03/20/2018    Past Surgical History: Past Surgical History:  Procedure Laterality Date   AV FISTULA PLACEMENT Left 07/30/2021   Procedure: INSERTION OF ARTERIOVENOUS (AV) GORE-TEX GRAFT ARM BRACHIAL ARTERY TO AXILLARY VEIN;  Surgeon: Annice Needy, MD;  Location: [facility];  Service: Vascular;  Laterality: Left;   CAPD INSERTION N/A 12/31/2019   Procedure: LAPAROSCOPIC INSERTION CONTINUOUS AMBULATORY PERITONEAL DIALYSIS  (CAPD) CATHETER;  Surgeon: Leafy Ro, MD;  Location: [facility];  Service: General;  Laterality: N/A;   CAPD REMOVAL N/A 04/10/2020   Procedure: LAPAROSCOPIC REVISION OF CONTINUOUS AMBULATORY PERITONEAL DIALYSIS  (CAPD) CATHETER;  Surgeon: Leafy Ro, MD;  Location: [facility];  Service: General;  Laterality: N/A;   CORONARY ARTERY BYPASS GRAFT N/A 12/27/2018   Procedure: CORONARY ARTERY BYPASS GRAFTING (CABG) X 4 ON PUMP USING RIGHT & LEFT INTERNAL MAMMARY ARTERY LEFT RADIAL ARTERY ENDOSCOPICALLY HARVESTED;  Surgeon: Linden Dolin, MD;  Location: [facility];  Service: Open Heart Surgery;  Laterality: N/A;   CYSTOSCOPY W/ RETROGRADES Bilateral 05/15/2019   Procedure: CYSTOSCOPY WITH RETROGRADE PYELOGRAM;  Surgeon: Lonna Cobb,  Verna Czech, MD;  Location: [facility];  Service: Urology;  Laterality: Bilateral;   CYSTOSCOPY WITH BIOPSY N/A 05/15/2019   Procedure: CYSTOSCOPY WITH bladder BIOPSY;  Surgeon: Riki Altes, MD;  Location: [facility];  Service: Urology;  Laterality: N/A;   DIALYSIS/PERMA CATHETER INSERTION N/A 12/28/2019   Procedure: DIALYSIS/PERMA CATHETER INSERTION;  Surgeon: Annice Needy, MD;  Location: [facility];  Service: Cardiovascular;  Laterality: N/A;   DIALYSIS/PERMA CATHETER INSERTION N/A 03/18/2021   Procedure: DIALYSIS/PERMA CATHETER INSERTION;  Surgeon: Annice Needy, MD;  Location: [facility];  Service: Cardiovascular;  Laterality: N/A;   DIALYSIS/PERMA CATHETER REMOVAL N/A 06/02/2020   Procedure: DIALYSIS/PERMA  CATHETER REMOVAL;  Surgeon: Annice Needy, MD;  Location: [facility];  Service: Cardiovascular;  Laterality: N/A;   DIALYSIS/PERMA CATHETER REPAIR N/A 10/22/2022   Procedure: DIALYSIS/PERMA CATHETER REPAIR;  Surgeon: Renford Dills, MD;  Location: [facility];  Service: Cardiovascular;  Laterality: N/A;   EXCHANGE OF A DIALYSIS CATHETER Right 04/10/2020   Procedure: EXCHANGE OF A DIALYSIS CATHETER;  Surgeon: Leafy Ro, MD;  Location: [facility];  Service: General;  Laterality: Right;   INCISIONAL HERNIA REPAIR  01/20/2021   Procedure: HERNIA REPAIR INCISIONAL;  Surgeon: Henrene Dodge, MD;  Location: [facility];  Service: General;;   IR IMAGE GUIDED DRAINAGE PERCUT CATH  PERITONEAL RETROPERIT  04/07/2020   LAPAROSCOPY N/A 04/16/2021   Procedure: LAPAROSCOPY DIAGNOSTIC;  Surgeon: Sung Amabile, DO;  Location: [facility];  Service: General;  Laterality: N/A;   LAPAROTOMY N/A 04/26/2022   Procedure: EXPLORATORY LAPAROTOMY WITH REPAIR OF DUODENAL ULCER;  Surgeon: Carolan Shiver, MD;  Location: [facility];  Service: General;  Laterality: N/A;   LEFT HEART CATH AND CORONARY ANGIOGRAPHY Left 12/20/2018   Procedure: LEFT HEART CATH AND CORONARY ANGIOGRAPHY;  Surgeon: Marcina Millard, MD;  Location: [facility];  Service: Cardiovascular;  Laterality: Left;   LOWER EXTREMITY ANGIOGRAPHY Right 12/23/2022   Procedure: Lower Extremity Angiography;  Surgeon: Annice Needy, MD;  Location: [facility];  Service: Cardiovascular;  Laterality: Right;   RADIAL ARTERY HARVEST Left 12/27/2018   Procedure: ENDOSCOPIC RADIAL ARTERY HARVEST;  Surgeon: Linden Dolin, MD;  Location: [facility];  Service: Open Heart Surgery;  Laterality: Left;   REMOVAL OF A DIALYSIS CATHETER Left 03/20/2021   Procedure: REMOVAL OF A PD CATHETER;  Surgeon: Annice Needy, MD;  Location: [facility];  Service: Vascular;  Laterality: Left;   REVISION OF ARTERIOVENOUS GORETEX GRAFT Left 09/11/2021   Procedure:  Excisionof infected AV graft;  Surgeon: Renford Dills, MD;  Location: [facility];  Service: Vascular;  Laterality: Left;   TEE WITHOUT CARDIOVERSION N/A 12/27/2018   Procedure: TRANSESOPHAGEAL ECHOCARDIOGRAM (TEE);  Surgeon: Linden Dolin, MD;  Location: [facility] OR;  Service: Open Heart Surgery;  Laterality: N/A;   TUMOR REMOVAL  2019   Bladder    Allergies: No Known Allergies  Home Medications: Medications Prior to Admission  Medication Sig Dispense Refill Last Dose   apixaban (ELIQUIS) 5 MG TABS tablet Take 1 tablet (5 mg total) by mouth 2 (two) times daily. 60 tablet 0 Past Week   aspirin EC 81 MG tablet Take 81 mg by mouth daily.   Past Week   atorvastatin (LIPITOR) 20 MG tablet Take 1 tablet (20 mg total) by mouth daily. 30 tablet 11 Past Week   calcium acetate (PHOSLO) 667 MG capsule Take 1 capsule (667 mg total) by mouth 3 (  three) times daily with meals. 30 capsule 0 Past Week   oxyCODONE-acetaminophen (PERCOCET/ROXICET) 5-325 MG tablet Take 1-2 tablets by mouth every 6 (six) hours as needed for severe pain (pain score 7-10). 40 tablet 0 01/07/2023   pantoprazole (PROTONIX) 40 MG tablet Take 1 tablet (40 mg total) by mouth daily. 30 tablet 0 prn at unknown   sevelamer carbonate (RENVELA) 800 MG tablet Take 1,600 mg by mouth 3 (three) times daily.   Past Week   amLODipine (NORVASC) 2.5 MG tablet Take 1 tablet (2.5 mg total) by mouth daily. (Patient not taking: Reported on 01/08/2023) 30 tablet 0 Not Taking   isosorbide dinitrate (ISORDIL) 10 MG tablet Take 1 tablet (10 mg total) by mouth 3 (three) times daily. (Patient not taking: Reported on 01/08/2023) 90 tablet 0 Not Taking   Home medication reconciliation was completed with the patient.   Scheduled Inpatient Medications:    sodium chloride   Intravenous Once   [START ON 01/09/2023] Chlorhexidine Gluconate Cloth  6 each Topical Q0600   pantoprazole (PROTONIX) IV  40 mg Intravenous Q6H   Followed by   Melene Muller ON 01/11/2023]  pantoprazole (PROTONIX) IV  40 mg Intravenous Q12H   sodium zirconium cyclosilicate  10 g Oral Once    Continuous Inpatient Infusions:    sodium chloride     anticoagulant sodium citrate     desmopressin (DDAVP) 20 mcg in sodium chloride 0.9 % 50 mL IVPB     norepinephrine (LEVOPHED) Adult infusion Stopped (01/08/23 1805)    PRN Inpatient Medications:  alteplase, anticoagulant sodium citrate, feeding supplement (NEPRO CARB STEADY), heparin, lidocaine (PF), lidocaine-prilocaine, pentafluoroprop-tetrafluoroeth  Family History: family history includes Heart failure in his mother.   GI Family History: Negative.  Social History:   reports that he quit smoking about 3 years ago. His smoking use included cigarettes. He started smoking about 2 weeks ago. He has never used smokeless tobacco. He reports that he does not drink alcohol and does not use drugs. The patient denies ETOH, tobacco, or drug use.    Review of Systems: Review of Systems - Negative except HPI  Physical Examination: BP 92/72 (BP Location: Left Arm)   Pulse 90   Temp (!) 97 F (36.1 C) (Rectal)   Resp 15   Ht 5\' 2"  (1.575 m)   Wt 46.6 kg   SpO2 100%   BMI 18.79 kg/m  Physical Exam Cardiovascular:     Rate and Rhythm: Normal rate.  Pulmonary:     Effort: Pulmonary effort is normal.     Breath sounds: Normal breath sounds. No decreased breath sounds or wheezing.  Chest:     Chest wall: No mass or deformity.  Abdominal:     General: Bowel sounds are normal.     Palpations: Abdomen is soft. There is no splenomegaly.     Tenderness: There is no guarding or rebound.  Musculoskeletal:     Cervical back: Normal range of motion.  Skin:    General: Skin is dry.     Capillary Refill: Capillary refill takes 2 to 3 seconds.     Coloration: Skin is cyanotic and pale.  Neurological:     General: No focal deficit present.     Data: Lab Results  Component Value Date   WBC 17.4 (H) 01/08/2023   HGB 4.0 (LL)  01/08/2023   HCT 13.5 (LL) 01/08/2023   MCV 100.7 (H) 01/08/2023   PLT 519 (H) 01/08/2023   Recent Labs  Lab 01/06/23 1040  01/08/23 1324 01/08/23 1426  HGB 12.3* 4.2* 4.0*   Lab Results  Component Value Date   NA 137 01/08/2023   K 6.3 (HH) 01/08/2023   CL 101 01/08/2023   CO2 18 (L) 01/08/2023   BUN 144 (H) 01/08/2023   CREATININE 8.03 (H) 01/08/2023   Lab Results  Component Value Date   ALT 17 01/08/2023   AST 28 01/08/2023   ALKPHOS 45 01/08/2023   BILITOT 0.2 01/08/2023   No results for input(s): "APTT", "INR", "PTT" in the last 168 hours.    Latest Ref Rng & Units 01/08/2023    2:26 PM 01/08/2023    1:24 PM 01/06/2023   10:40 AM  CBC  WBC 4.0 - 10.5 K/uL 17.4  18.4  6.8   Hemoglobin 13.0 - 17.0 g/dL 4.0  4.2  16.1   Hematocrit 39.0 - 52.0 % 13.5  14.3  40.9   Platelets 150 - 400 K/uL 519  505  225     STUDIES: CT Angio Chest PE W/Cm &/Or Wo Cm  Result Date: 01/08/2023 CLINICAL DATA:  Acute shortness of breath, wheezing, and hypoxia. Previous pulmonary embolism. EXAM: CT ANGIOGRAPHY CHEST WITH CONTRAST TECHNIQUE: Multidetector CT imaging of the chest was performed using the standard protocol during bolus administration of intravenous contrast. Multiplanar CT image reconstructions and MIPs were obtained to evaluate the vascular anatomy. RADIATION DOSE REDUCTION: This exam was performed according to the departmental dose-optimization program which includes automated exposure control, adjustment of the mA and/or kV according to patient size and/or use of iterative reconstruction technique. CONTRAST:  75mL OMNIPAQUE IOHEXOL 350 MG/ML SOLN COMPARISON:  10/14/2022 FINDINGS: Cardiovascular: Satisfactory opacification of pulmonary arteries noted. A single tiny nonocclusive embolus is seen in a subsegmental left lower lobe pulmonary artery branch (image 278/6) which is unchanged since previous study. No acute pulmonary embolism identified. No evidence of thoracic aortic  dissection or aneurysm. Mediastinum/Nodes: No masses or pathologically enlarged lymph nodes identified. Lungs/Pleura: Moderate centrilobular emphysema again noted. No pulmonary mass, infiltrate, or effusion. Upper abdomen: No acute findings. Musculoskeletal: No suspicious bone lesions identified. Review of the MIP images confirms the above findings. IMPRESSION: No evidence of acute pulmonary embolism or other acute findings. 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 Emphysema (ICD10-J43.9). Electronically Signed   By: Danae Orleans M.D.   On: 01/08/2023 17:42   CT ABDOMEN PELVIS W CONTRAST  Result Date: 01/08/2023 CLINICAL DATA:  Acute abdominal pain. EXAM: CT ABDOMEN AND PELVIS WITH CONTRAST TECHNIQUE: Multidetector CT imaging of the abdomen and pelvis was performed using the standard protocol following bolus administration of intravenous contrast. RADIATION DOSE REDUCTION: This exam was performed according to the departmental dose-optimization program which includes automated exposure control, adjustment of the mA and/or kV according to patient size and/or use of iterative reconstruction technique. CONTRAST:  75mL OMNIPAQUE IOHEXOL 350 MG/ML SOLN COMPARISON:  09/03/2022 FINDINGS: Lower Chest: No acute findings. Hepatobiliary: Several previously demonstrated benign hemangiomas and cysts show no significant change since prior study. Probable tiny gallstones seen, however, there is no No evidence of cholecystitis or biliary ductal dilatation. Pancreas:  No mass or inflammatory changes. Spleen: Within normal limits in size and appearance. Adrenals/Urinary Tract: Severe bilateral diffuse renal parenchymal atrophy again seen, consistent with end-stage renal disease. No suspicious masses identified. No evidence of ureteral calculi or hydronephrosis. Diffuse bladder wall thickening again noted, which may be due to chronic bladder outlet  obstruction or cystitis. Stomach/Bowel: No evidence of  obstruction, inflammatory process or abnormal fluid collections. Vascular/Lymphatic: No pathologically enlarged lymph nodes. No acute vascular findings. Reproductive:  No mass or other significant abnormality. Other:  None. Musculoskeletal:  No suspicious bone lesions identified. IMPRESSION: No acute findings. Stable diffuse bladder wall thickening, which may be due to chronic bladder outlet obstruction or cystitis. Recommend correlation with urinalysis. Probable tiny gallstones. No radiographic evidence of cholecystitis. Chronic severe bilateral renal atrophy, consistent with end-stage renal disease. No evidence of hydronephrosis. Electronically Signed   By: Danae Orleans M.D.   On: 01/08/2023 17:26   @IMAGES @  Assessment:  Acute hemorrhagic shock. Likely from acute gastrointestinal bleeding. Acute GI bleed - likely UGI source given presentation. Hypotension -stabilizing with IV resuscitaion, and blood transfusion. Chronic atrial fibrillation - rate lower after IV resuscitation. CAD s/p CABG End Stage Renal disease on hemodialysis. Hyperkalemia - treated with hemodialysis. Repeat K+ pending. CHF Hx of pulmonary embolism    Recommendations:  Continue IV protonix. Serial H/H. Serial exams. EGD when clinically feasible. Will tentatively place for tomorrow AM if medically stable at that time. The patient understands the nature of the planned procedure, indications, risks, alternatives and potential complications including but not limited to bleeding, infection, perforation, damage to internal organs and possible oversedation/side effects from anesthesia. The patient agrees and gives consent to proceed.  Please refer to procedure notes for findings, recommendations and patient disposition/instructions. Will follow along closely.  Thank you for the consult. Please call with questions or concerns.  Rosina Lowenstein, "Mellody Dance MD [facility] [address] [place], [place] [address] [phone]  01/08/2023 6:24 PM

## 2023-01-08 NOTE — Progress Notes (Signed)
[facility]  ROUNDING NOTE   Subjective:   Anthony Hess is a 67 year old male with past medical conditions including hypertension, PVD, dyslipidemia, CAD, four-vessel CABG, and end-stage renal disease on hemodialysis. Patient presents for outpatient angiogram with vascular surgery and was found to have abnormal labs. He is bring admitted for [facility], leg pain  Patient is known to our practice and receives outpatient dialysis at [facility] on a TTS schedule,    Patient presents to the emergency room via EMS for shortness of breath and he is currently on oxygen supplementation by facemask.  His labs show hemoglobin down to 4.  Patient also reported dark stools for the past 2 days.  He is on Eliquis.  He is also found to have severe hyperkalemia with potassium of 6.3.  He has been given shifting measures in the emergency room. Currently he is alert and oriented and denies any acute symptoms except shortness of breath. No leg edema. He states his last dialysis treatment was Tuesday.    Objective:  Vital signs in last 24 hours:  Temp:  [97.8 F (36.6 C)] 97.8 F (36.6 C) (11/16 1316) Pulse Rate:  [59-78] 59 (11/16 1330) Resp:  [17-24] 17 (11/16 1400) BP: (99-110)/(70-81) 107/81 (11/16 1400) SpO2:  [91 %-100 %] 91 % (11/16 1330) Weight:  [59 kg] 59 kg (11/16 1318)  Weight change:  Filed Weights   01/08/23 1318  Weight: 59 kg    Intake/Output: No intake/output data recorded.   Intake/Output this shift:  No intake/output data recorded.  Physical Exam: General: NAD  Head: Normocephalic, atraumatic. Moist oral mucosal membranes  Eyes: Anicteric  Neck: Supple  Lungs:  Mild basilar crackles.  Oxygen by Ventimask.  Heart: Regular rate and rhythm  Abdomen:  Soft, nontender  Extremities:  No peripheral edema.  Neurologic: Alert and oriented, answering questions appropriately  Skin: No lesions, RLE warm  Access: Rt chest permcath    Basic Metabolic Panel: Recent Labs   Lab 01/06/23 1040 01/08/23 1324  NA 138 137  K 3.9 6.3*  CL 101 101  CO2 27 18*  GLUCOSE 92 141*  BUN 68* 144*  CREATININE 3.86* 8.03*  CALCIUM 8.0* 7.6*    Liver Function Tests: Recent Labs  Lab 01/06/23 1040 01/08/23 1324  AST 21 28  ALT 19 17  ALKPHOS 41 45  BILITOT 0.3 0.2  PROT 5.4* 5.2*  ALBUMIN 2.6* 2.5*   Recent Labs  Lab 01/08/23 1324  LIPASE 28   No results for input(s): "AMMONIA" in the last 168 hours.  CBC: Recent Labs  Lab 01/06/23 1040 01/08/23 1324 01/08/23 1426  WBC 6.8 18.4* 17.4*  NEUTROABS 5.7 15.6* 14.9*  HGB 12.3* 4.2* 4.0*  HCT 40.9 14.3* 13.5*  MCV 93.8 100.0 100.7*  PLT 225 505* 519*    Cardiac Enzymes: No results for input(s): "CKTOTAL", "CKMB", "CKMBINDEX", "TROPONINI" in the last 168 hours.  BNP: Invalid input(s): "POCBNP"  CBG: No results for input(s): "GLUCAP" in the last 168 hours.   Microbiology: Results for orders placed or performed during the hospital encounter of 12/21/22  MRSA Next Gen by PCR, Nasal     Status: None   Collection Time: 12/21/22 12:38 PM   Specimen: Nasal Mucosa; Nasal Swab  Result Value Ref Range Status   MRSA by PCR Next Gen NOT DETECTED NOT DETECTED Final    Comment: (NOTE) The GeneXpert MRSA Assay (FDA approved for NASAL specimens only), is one component of a comprehensive MRSA colonization surveillance program. It  is not intended to diagnose MRSA infection nor to guide or monitor treatment for MRSA infections. Test performance is not FDA approved in patients less than 29 years old. Performed at [facility], [address] [place], [place] [address]     Coagulation Studies: No results for input(s): "LABPROT", "INR" in the last 72 hours.   Urinalysis: No results for input(s): "COLORURINE", "LABSPEC", "PHURINE", "GLUCOSEU", "HGBUR", "BILIRUBINUR", "KETONESUR", "PROTEINUR", "UROBILINOGEN", "NITRITE", "LEUKOCYTESUR" in the last 72 hours.  Invalid input(s): "APPERANCEUR"     Imaging: No results found.   Medications:    desmopressin (DDAVP) 20 mcg in sodium chloride 0.9 % 50 mL IVPB       [START ON 01/09/2023] Chlorhexidine Gluconate Cloth  6 each Topical Q0600   pantoprazole (PROTONIX) IV  40 mg Intravenous Once   pantoprazole (PROTONIX) IV  40 mg Intravenous Once   Followed by   pantoprazole (PROTONIX) IV  40 mg Intravenous Q6H   Followed by   Melene Muller ON 01/11/2023] pantoprazole (PROTONIX) IV  40 mg Intravenous Q12H   sodium zirconium cyclosilicate  10 g Oral Once   iohexol  Assessment/ Plan:  Anthony Hess is a 67 y.o.  male with past medical conditions including hypertension, PVD, dyslipidemia, CAD, four-vessel CABG, and end-stage renal disease on hemodialysis. Patient presents for outpatient angiogram with vascular surgery and was found to have abnormal labs. He is bring admitted for [facility], leg pain   End stage renal disease with hyperkalemia on hemodialysis.  Urgent hemodialysis today for correction of potassium as well as uremia.  Volume removal due to concerns of GI bleed and acute MI.  2. Anemia of chronic kidney disease and acute Blood loss.  Lab Results  Component Value Date   HGB 4.0 (LL) 01/08/2023  Hemoglobin is severely low.  patient is scheduled to receive 2 units of blood transfusion. Concern of acute GI bleed.  Agree with holding Eliquis.  Agree with IV DDAVP.  3. Secondary Hyperparathyroidism: with outpatient labs: None available  Lab Results  Component Value Date   PTH 170 (H) 06/25/2022   CALCIUM 7.6 (L) 01/08/2023   CAION 0.82 (LL) 10/13/2022   PHOS 9.4 (H) 11/17/2022    Hypocalcemia noted.  High Phos.  Sevelamer and calcium acetate be restarted once patient is able to have regular meals..   4.  Peripheral vascular disease-patient is status post angioplasty of right leg with stent placement on 12/23/2022.   LOS: 0 Anthony Hess 11/16/20243:43 PM

## 2023-01-08 NOTE — ED Provider Notes (Addendum)
Procedures      ----------------------------------------- 3:44 PM on 01/08/2023 ----------------------------------------- Assumed care from Dr. Larinda Buttery awaiting rest of the labs and CT scan.  1) UGIB - melena, Hb 12 --> 4. VS stable. Sx with SOB. D/w GI Dr. Norma Fredrickson. Transfusing 2 units RBC, then recheck Hb. Target Hb > 7.0.  Protonix 80mg  IV bolus now, then 40mg  IV q6h (PPI infusion not available due to IVF shortage).  Last dose of Eliquis (for PE) 48 hours ago, no reversal needed.  2) ESRD on HD, K 6.3 with widening of QRS on EKG. D/w Nephrology Dr. Thedore Mins, will arrange dialysis in ED room imminently. Pt received calcium, insulin for cardiac protection.   3) NSTEMI, trop 3800. D/w cardiology. EKG nonischemic. Suspect demand due to profound acute anemia. Resuscitate with RBCs, no anticoag. For now, no urgent intervention. Can consult to [facility] Cards once stable.    Final diagnoses:  Gastrointestinal hemorrhage, unspecified gastrointestinal hemorrhage type  Hyperkalemia  SOB (shortness of breath)  ESRD on hemodialysis [facility])  Demand ischemia of myocardium (HCC)     ----------------------------------------- 4:01 PM on 01/08/2023 ----------------------------------------- D/w hospitalist.   Sharman Cheek, MD 01/08/23 1601

## 2023-01-08 NOTE — ED Notes (Signed)
Nephrologist at bedside

## 2023-01-08 NOTE — ED Notes (Signed)
Informed EDP [place] of hgb results in chart. New hgb to be drawn to confirm. Pt asking for pain meds, informed [place].

## 2023-01-08 NOTE — ED Notes (Signed)
Pt signed procedural consent for hemodialysis on e signature pad.

## 2023-01-08 NOTE — ED Notes (Signed)
Spoke with [facility] EDP, verbal order to give both bags of blood at the same time and at 250-582mL/hr.

## 2023-01-08 NOTE — ED Provider Notes (Signed)
[facility] Provider Note    Event Date/Time   First MD Initiated Contact with Patient 01/08/23 1313     (approximate)   History   Chief Complaint Shortness of Breath (/)   HPI  Anthony Hess is a 67 y.o. male with past medical history of hypertension, CAD status post CABG, atrial fibrillation on Eliquis, COPD, ESRD on HD (TTS), PE, and peripheral vascular disease who presents to the ED complaining of shortness of breath.  Patient reports that he has been feeling increasingly short of breath over the past 2 days, denies any associated fever or cough.  He does report some pain across his abdomen diffusely, but denies any pain in his chest.  He has not noticed any pain or swelling in his legs, does admit that he has missed a few doses of his Eliquis recently.  He last received dialysis 2 days ago, but presented to the ED for chest pain before completing his treatment.  He was unable to go to his dialysis appointment today due to his symptoms.     Physical Exam   Triage Vital Signs: ED Triage Vitals  Encounter Vitals Group     BP 01/08/23 1316 110/77     Systolic BP Percentile --      Diastolic BP Percentile --      Pulse Rate 01/08/23 1316 78     Resp 01/08/23 1316 (!) 21     Temp 01/08/23 1316 97.8 F (36.6 C)     Temp Source 01/08/23 1316 Axillary     SpO2 01/08/23 1316 100 %     Weight 01/08/23 1318 130 lb (59 kg)     Height 01/08/23 1318 5\' 2"  (1.575 m)     Head Circumference --      Peak Flow --      Pain Score 01/08/23 1318 7     Pain Loc --      Pain Education --      Exclude from Growth Chart --     Most recent vital signs: Vitals:   01/08/23 1330 01/08/23 1400  BP: 99/70 107/81  Pulse: (!) 59   Resp: (!) 24 17  Temp:    SpO2: 91%     Constitutional: Alert and oriented. Eyes: Conjunctivae are normal. Head: Atraumatic. Nose: No congestion/rhinnorhea. Mouth/Throat: Mucous membranes are moist.  Cardiovascular: Normal rate, regular  rhythm. Grossly normal heart sounds.  2+ radial pulses bilaterally.  Right IJ TDC intact. Respiratory: Normal respiratory effort.  No retractions. Lungs CTAB. Gastrointestinal: Soft and diffusely tender to palpation with no rebound or guarding. No distention. Musculoskeletal: No lower extremity tenderness nor edema.  Neurologic:  Normal speech and language. No gross focal neurologic deficits are appreciated.    ED Results / Procedures / Treatments   Labs (all labs ordered are listed, but only abnormal results are displayed) Labs Reviewed  CBC WITH DIFFERENTIAL/PLATELET - Abnormal; Notable for the following components:      Result Value   WBC 18.4 (*)    RBC 1.43 (*)    Hemoglobin 4.2 (*)    HCT 14.3 (*)    MCHC 29.4 (*)    RDW 19.4 (*)    Platelets 505 (*)    Neutro Abs 15.6 (*)    Monocytes Absolute 1.2 (*)    Abs Immature Granulocytes 0.30 (*)    All other components within normal limits  COMPREHENSIVE METABOLIC PANEL - Abnormal; Notable for the following components:   Potassium 6.3 (*)  CO2 18 (*)    Glucose, Bld 141 (*)    BUN 144 (*)    Creatinine, Ser 8.03 (*)    Calcium 7.6 (*)    Total Protein 5.2 (*)    Albumin 2.5 (*)    GFR, Estimated 7 (*)    Anion gap 18 (*)    All other components within normal limits  CBC WITH DIFFERENTIAL/PLATELET - Abnormal; Notable for the following components:   WBC 17.4 (*)    RBC 1.34 (*)    Hemoglobin 4.0 (*)    HCT 13.5 (*)    MCV 100.7 (*)    MCHC 29.6 (*)    RDW 19.6 (*)    Platelets 519 (*)    Neutro Abs 14.9 (*)    Abs Immature Granulocytes 0.31 (*)    All other components within normal limits  TROPONIN I (HIGH SENSITIVITY) - Abnormal; Notable for the following components:   Troponin I (High Sensitivity) 3,803 (*)    All other components within normal limits  CULTURE, BLOOD (ROUTINE X 2)  CULTURE, BLOOD (ROUTINE X 2)  LIPASE, BLOOD  LACTIC ACID, PLASMA  LACTIC ACID, PLASMA  HEPATITIS B SURFACE ANTIGEN  HEPATITIS  B SURFACE ANTIBODY, QUANTITATIVE  TYPE AND SCREEN  PREPARE RBC (CROSSMATCH)  TROPONIN I (HIGH SENSITIVITY)     EKG  ED ECG REPORT I, Chesley Noon, the attending physician, personally viewed and interpreted this ECG.   Date: 01/08/2023  EKG Time: 13:18  Rate: 75  Rhythm: normal sinus rhythm  Axis: LAD  Intervals:right bundle branch block and left anterior fascicular block  ST&T Change: None  PROCEDURES:  Critical Care performed: Yes, see critical care procedure note(s)  .Critical Care  Performed by: Chesley Noon, MD Authorized by: Chesley Noon, MD   Critical care provider statement:    Critical care time (minutes):  30   Critical care time was exclusive of:  Separately billable procedures and treating other patients and teaching time   Critical care was necessary to treat or prevent imminent or life-threatening deterioration of the following conditions:  Metabolic crisis (Anemia)   Critical care was time spent personally by me on the following activities:  Development of treatment plan with patient or surrogate, discussions with consultants, evaluation of patient's response to treatment, examination of patient, ordering and review of laboratory studies, ordering and review of radiographic studies, ordering and performing treatments and interventions, pulse oximetry, re-evaluation of patient's condition and review of old charts   I assumed direction of critical care for this patient from another provider in my specialty: no     Care discussed with: admitting provider      MEDICATIONS ORDERED IN ED: Medications  iohexol (OMNIPAQUE) 350 MG/ML injection 100 mL (has no administration in time range)  sodium zirconium cyclosilicate (LOKELMA) packet 10 g (has no administration in time range)  albuterol (PROVENTIL) (2.5 MG/3ML) 0.083% nebulizer solution 10 mg (has no administration in time range)  Chlorhexidine Gluconate Cloth 2 % PADS 6 each (has no administration in time  range)  morphine (PF) 2 MG/ML injection 2 mg (2 mg Intravenous Given 01/08/23 1422)  pantoprazole (PROTONIX) injection 40 mg (40 mg Intravenous Given 01/08/23 1513)  calcium gluconate inj 10% (1 g) URGENT USE ONLY! (1 g Intravenous Given 01/08/23 1508)  insulin aspart (novoLOG) injection 10 Units (10 Units Intravenous Given 01/08/23 1511)    And  dextrose 50 % solution 50 mL (50 mLs Intravenous Given 01/08/23 1511)     IMPRESSION / MDM /  ASSESSMENT AND PLAN / ED COURSE  I reviewed the triage vital signs and the nursing notes.                              67 y.o. male with past medical history of hypertension, CAD status post CABG, atrial fibrillation on Eliquis, COPD, ESRD on HD, PE, and PVD who presents to the ED with increasing difficulty breathing and abdominal pain over the past 2 days.  Patient's presentation is most consistent with acute presentation with potential threat to life or bodily function.  Differential diagnosis includes, but is not limited to, ACS, PE, pneumonia, pneumothorax, pulmonary edema, COPD exacerbation, bowel obstruction, appendicitis, cholecystitis, biliary colic, gastritis, electrolyte abnormality, anemia.  Patient chronically ill but nontoxic-appearing and in no acute distress, vital signs are unremarkable.  He received 1 DuoNeb and albuterol with EMS, reports symptoms are improved and he is currently maintaining oxygen saturations at 100% on room air, minimal wheezing noted.  EKG shows no evidence of arrhythmia or ischemia, troponin pending at this time, but patient is high risk for PE given he has not been compliant with anticoagulation.  We will check CTA of his chest, also check CT of his abdomen/pelvis.  Lab results are pending at this time.  Labs show significant anemia with hemoglobin of 4, significant drop from a couple of days ago, with associated leukocytosis.  Severe anemia is likely the source of shortness of breath and we will transfuse 2 unit PRBCs.   Patient notes dark stools for the past 2 days and rectal exam shows guaiac positive stool.  We will give dose of IV Protonix due to suspected GI bleed, patient states his last dose of Eliquis was 2 days ago, do not feel he needs reversal at this time.  Labs also show potassium of 6.3 after patient missed dialysis today, EKG with some widening of QRS compared to previous.  We will give dose of calcium, insulin, albuterol, and Lokelma.  Case discussed with Dr. Thedore Mins of nephrology, who will arrange for dialysis.  Patient turned over to oncoming rider pending CT results and admission.      FINAL CLINICAL IMPRESSION(S) / ED DIAGNOSES   Final diagnoses:  Gastrointestinal hemorrhage, unspecified gastrointestinal hemorrhage type  Hyperkalemia  SOB (shortness of breath)     Rx / DC Orders   ED Discharge Orders     None        Note:  This document was prepared using Dragon voice recognition software and may include unintentional dictation errors.   Chesley Noon, MD 01/08/23 1515

## 2023-01-08 NOTE — ED Notes (Signed)
Other RN is going to pick up blood which is to be given as a rapid bolus. Scotty Court was just at bedside. Pt to be started on pressors. Pt will then go to ICU and have dialysis there.

## 2023-01-08 NOTE — Progress Notes (Signed)
1615: Set up done for HD in the ED but patient's blood pressure was trending down. Patient was seen by the ED RN and MD and they decided to start him on pressors  and transfer patient to ICU and initiate HD once transferred.  Will do another HD set up in the ICU. Dr. Thedore Mins was notified.

## 2023-01-08 NOTE — H&P (Signed)
History and Physical    Patient: Anthony Hess GNF:[id number] DOB: [birth year] DOA: 01/08/2023 DOS: the patient was seen and examined on 01/08/2023 PCP: Ronnald Ramp, MD  Patient coming from: Home  Chief Complaint:  Chief Complaint  Patient presents with   Shortness of Breath        HPI: Anthony Hess is a 67 y.o. male with medical history significant of history of hypertension, atrial fibrillation on Eliquis, ESRD on hemodialysis TTS through right tunneled IJ catheter, COPD, peripheral vascular disease presented to the ED complaining of shortness of breath.  Patient has been having worsening shortness of breath since last 2 days.  Generalized hemodialysis today due to severe shortness of breath.  Patient noted that he has dark stools and has been not taking Eliquis.  Patient was last seen in the emergency department 2 days ago for similar complaints.  In the ED, patient's initial blood pressure 110/77, tachypneic with respiratory 21, saturating 100% on room air.  Laboratory findings showed white count 18.4, hemoglobin 4.2, platelets 505, potassium 6.3, creatinine of 8.03.  Her repeat hemoglobin done with differential showed WBC 17.4, hemoglobin 4.0, platelets 519.  Troponin noted to be 3800.  When his significant GI bleed, ED provider discussed with Dr. Norma Fredrickson regarding upper GI bleed with significant drop of hemoglobin from 12-4.  Patient is started on Protonix 80 mg IV bolus then 40 mg IV every 6 hourly as PPI infusion unable to be given due to IV fluid shortage.  Patient's hemoglobin target should be greater than 7. Nephrologist consulted for urgent dialysis due to potassium of 6.3.  Nephrologist arrange for dialysis in the emergency department. Patient's troponin noted to be 3800, ED provider discussed with cardiologist who thinks elevated troponin likely demand ischemia advised no anticoagulation given severe GI bleed and [facility] cardiology will see him.  During my initial  assessment, patient is able to answer me appropriately, hemodialysis nurse at bedside.  RN notified that his blood pressures 66 over 40.  I advised her to start giving blood, vasopressor therapy, ICU consultation.  I discussed with PCCM provider, who advised ICU for admission, hemodialysis set up in ICU, continue vasopressor therapy and blood transfusion.  ICU team will continue to follow. Patient is currently being admitted to hospitalist service in the ICU with close PCCM evaluation and follow-up along with nephrologist, GI and cardiologist evaluation.  Review of Systems: As mentioned in the history of present illness. All other systems reviewed and are negative. Past Medical History:  Diagnosis Date   Aortic atherosclerosis (HCC)    Bilateral carotid artery disease (HCC)    Bladder cancer (HCC)    Coronary artery disease 12/20/2018   a.) LHC 12/20/2018: 50% OM1, 40% OM2, 95% o-pLAD, 75% o=pLCx, 40% mLM, 70% D1, 60% mRCA-1, 50% mRCA-2; refer to CVTS. b.) 4v CABG at [facility] on 12/27/2018: LIMA-LAD, RIMA-PDA, seg LRA-OM1-D1   DCM (dilated cardiomyopathy) (HCC) 12/05/2018   a.) TTE 12/05/2018: EF 40-45%. b.) TTE 12/28/2019: EF 20-25%.   Dyspnea 10/15/2022   ESRD (end stage renal disease) (HCC)    a.) T-Th-Sat   HFrEF (heart failure with reduced ejection fraction) (HCC) 12/05/2018   a.) TTE 12/05/2018: EF 40-45%; mild LVH; ant/apical/sep HK; mild TR . b.) TTE 12/28/2019: EF 20-25%; mod LVH; mod MR/AR; G1DD.   History of 2019 novel coronavirus disease (COVID-19) 04/08/2021   History of kidney stones    HLD (hyperlipidemia)    Hx of CABG 12/27/2018   Hypertension    Infrarenal abdominal aortic aneurysm (  AAA) without rupture (HCC) 03/05/2021   a.) CT abd/pelvis; measured 3.2 cm.   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   a.) LIMA-LAD, RIMA-PDA, sequential LEFT radial artery to OM1 and D1   Sepsis (HCC) 03/14/2021   Wears glasses    Past Surgical History:  Procedure Laterality Date   AV FISTULA PLACEMENT Left 07/30/2021   Procedure: INSERTION OF ARTERIOVENOUS (AV) GORE-TEX GRAFT ARM BRACHIAL ARTERY TO AXILLARY VEIN;  Surgeon: Annice Needy, MD;  Location: [facility] ORS;  Service: Vascular;  Laterality: Left;   CAPD INSERTION N/A 12/31/2019   Procedure: LAPAROSCOPIC INSERTION CONTINUOUS AMBULATORY PERITONEAL DIALYSIS  (CAPD) CATHETER;  Surgeon: Leafy Ro, MD;  Location: [facility] ORS;  Service: General;  Laterality: N/A;   CAPD REMOVAL N/A 04/10/2020   Procedure: LAPAROSCOPIC REVISION OF CONTINUOUS AMBULATORY PERITONEAL DIALYSIS  (CAPD) CATHETER;  Surgeon: Leafy Ro, MD;  Location: [facility] OR[facility];  Service: General;  Laterality: N/A;   CORONARY ARTERY BYPASS GRAFT N/A 12/27/2018   Procedure: CORONARY ARTERY BYPASS GRAFTING (CABG) X 4 ON PUMP USING RIGHT & LEFT INTERNAL MAMMARY ARTERY LEFT RADIAL ARTERY ENDOSCOPICALLY HARVESTED;  Surgeon: Linden Dolin, MD;  Location: [facility];  Service: Open Heart Surgery;  Laterality: N/A;   CYSTOSCOPY W/ RETROGRADES Bilateral 05/15/2019   Procedure: CYSTOSCOPY WITH RETROGRADE PYELOGRAM;  Surgeon: Riki Altes, MD;  Location: [facility];  Service: Urology;  Laterality: Bilateral;   CYSTOSCOPY WITH BIOPSY N/A 05/15/2019   Procedure: CYSTOSCOPY WITH bladder BIOPSY;  Surgeon: Riki Altes, MD;  Location: [facility];  Service: Urology;  Laterality: N/A;   DIALYSIS/PERMA CATHETER INSERTION N/A 12/28/2019   Procedure: DIALYSIS/PERMA CATHETER INSERTION;  Surgeon: Annice Needy, MD;  Location: [facility];  Service: Cardiovascular;  Laterality: N/A;   DIALYSIS/PERMA CATHETER INSERTION N/A 03/18/2021   Procedure: DIALYSIS/PERMA CATHETER INSERTION;  Surgeon: Annice Needy, MD;  Location: [facility];  Service: Cardiovascular;  Laterality: N/A;   DIALYSIS/PERMA CATHETER  REMOVAL N/A 06/02/2020   Procedure: DIALYSIS/PERMA CATHETER REMOVAL;  Surgeon: Annice Needy, MD;  Location: [facility];  Service: Cardiovascular;  Laterality: N/A;   DIALYSIS/PERMA CATHETER REPAIR N/A 10/22/2022   Procedure: DIALYSIS/PERMA CATHETER REPAIR;  Surgeon: Renford Dills, MD;  Location: [facility];  Service: Cardiovascular;  Laterality: N/A;   EXCHANGE OF A DIALYSIS CATHETER Right 04/10/2020   Procedure: EXCHANGE OF A DIALYSIS CATHETER;  Surgeon: Leafy Ro, MD;  Location: [facility];  Service: General;  Laterality: Right;   INCISIONAL HERNIA REPAIR  01/20/2021   Procedure: HERNIA REPAIR INCISIONAL;  Surgeon: Henrene Dodge, MD;  Location: [facility];  Service: General;;   IR IMAGE GUIDED DRAINAGE PERCUT CATH  PERITONEAL RETROPERIT  04/07/2020   LAPAROSCOPY N/A 04/16/2021   Procedure: LAPAROSCOPY DIAGNOSTIC;  Surgeon: Sung Amabile, DO;  Location: [facility];  Service: General;  Laterality: N/A;   LAPAROTOMY N/A 04/26/2022   Procedure: EXPLORATORY LAPAROTOMY WITH REPAIR OF DUODENAL ULCER;  Surgeon: Carolan Shiver, MD;  Location: [facility];  Service: General;  Laterality: N/A;   LEFT HEART CATH AND CORONARY ANGIOGRAPHY Left 12/20/2018   Procedure: LEFT HEART CATH AND CORONARY ANGIOGRAPHY;  Surgeon: Marcina Millard, MD;  Location: [facility];  Service: Cardiovascular;  Laterality: Left;   LOWER EXTREMITY ANGIOGRAPHY Right 12/23/2022   Procedure:  Lower Extremity Angiography;  Surgeon: Annice Needy, MD;  Location: [facility];  Service: Cardiovascular;  Laterality: Right;   RADIAL ARTERY HARVEST Left 12/27/2018   Procedure: ENDOSCOPIC RADIAL ARTERY HARVEST;  Surgeon: Linden Dolin, MD;  Location: [facility];  Service: Open Heart Surgery;  Laterality: Left;   REMOVAL OF A DIALYSIS CATHETER Left 03/20/2021   Procedure: REMOVAL OF A PD CATHETER;  Surgeon: Annice Needy, MD;  Location: [facility];  Service: Vascular;  Laterality: Left;   REVISION OF  ARTERIOVENOUS GORETEX GRAFT Left 09/11/2021   Procedure: Excisionof infected AV graft;  Surgeon: Renford Dills, MD;  Location: [facility];  Service: Vascular;  Laterality: Left;   TEE WITHOUT CARDIOVERSION N/A 12/27/2018   Procedure: TRANSESOPHAGEAL ECHOCARDIOGRAM (TEE);  Surgeon: Linden Dolin, MD;  Location[facility];  Service: Open Heart Surgery;  Laterality: N/A;   TUMOR REMOVAL  2019   Bladder   Social History:  reports that he quit smoking about 3 years ago. His smoking use included cigarettes. He started smoking about 2 weeks ago. He has never used smokeless tobacco. He reports that he does not drink alcohol and does not use drugs.  No Known Allergies  Family History  Problem Relation Age of Onset   Heart failure Mother     Prior to Admission medications   Medication Sig Start Date End Date Taking? Authorizing Provider  amLODipine (NORVASC) 2.5 MG tablet Take 1 tablet (2.5 mg total) by mouth daily. 05/07/22   Alford Highland, MD  apixaban (ELIQUIS) 5 MG TABS tablet Take 1 tablet (5 mg total) by mouth 2 (two) times daily. 05/01/22   Baron Hamper, MD  aspirin EC 81 MG tablet Take 81 mg by mouth daily. 05/06/20   [provider]  atorvastatin (LIPITOR) 20 MG tablet Take 1 tablet (20 mg total) by mouth daily. 12/25/22   Marcie Bal, NP  calcium acetate (PHOSLO) 667 MG capsule Take 1 capsule (667 mg total) by mouth 3 (three) times daily with meals. 11/18/21   Sunnie Nielsen, DO  isosorbide dinitrate (ISORDIL) 10 MG tablet Take 1 tablet (10 mg total) by mouth 3 (three) times daily. 10/15/22   Arnetha Courser, MD  oxyCODONE-acetaminophen (PERCOCET/ROXICET) 5-325 MG tablet Take 1-2 tablets by mouth every 6 (six) hours as needed for severe pain (pain score 7-10). 12/31/22   Schnier, Latina Craver, MD  pantoprazole (PROTONIX) 40 MG tablet Take 1 tablet (40 mg total) by mouth daily. 05/07/22 01/03/24  Alford Highland, MD  sevelamer carbonate (RENVELA) 800 MG tablet Take 1,600 mg by mouth 3  (three) times daily. 11/16/22   [provider]    Physical Exam: Vitals:   01/08/23 1430 01/08/23 1445 01/08/23 1500 01/08/23 1540  BP: 94/70  104/72 (!) 88/63  Pulse:  84  78  Resp: 16 20 18 10   Temp:      TempSrc:      SpO2:  100%  96%  Weight:      Height:       General - Elderly ill looking African-American male, in moderate respiratory distress HEENT - PERRLA, EOMI, atraumatic head, pale conjunctiva Lung - Clear, diffuse rales, rhonchi, no wheezes. Heart - S1, S2 heard, no murmurs, rubs, trace pedal edema Neuro - Alert, awake and oriented x 3, non focal exam. Skin -pale and dry. Data Reviewed:     Latest Ref Rng & Units 01/08/2023    2:26 PM 01/08/2023    1:24 PM 01/06/2023   10:40  AM  CBC  WBC 4.0 - 10.5 K/uL 17.4  18.4  6.8   Hemoglobin 13.0 - 17.0 g/dL 4.0  4.2  54.0   Hematocrit 39.0 - 52.0 % 13.5  14.3  40.9   Platelets 150 - 400 K/uL 519  505  225       Latest Ref Rng & Units 01/08/2023    1:24 PM 01/06/2023   10:40 AM 12/26/2022    4:55 AM  BMP  Glucose 70 - 99 mg/dL 981  92  191   BUN 8 - 23 mg/dL 478  68  43   Creatinine 0.61 - 1.24 mg/dL 2.95  6.21  3.08   Sodium 135 - 145 mmol/L 137  138  138   Potassium 3.5 - 5.1 mmol/L 6.3  3.9  4.9   Chloride 98 - 111 mmol/L 101  101  96   CO2 22 - 32 mmol/L 18  27  23    Calcium 8.9 - 10.3 mg/dL 7.6  8.0  8.0     CT ABDOMEN PELVIS W CONTRAST  Result Date: 01/08/2023 CLINICAL DATA:  Acute abdominal pain. EXAM: CT ABDOMEN AND PELVIS WITH CONTRAST TECHNIQUE: Multidetector CT imaging of the abdomen and pelvis was performed using the standard protocol following bolus administration of intravenous contrast. RADIATION DOSE REDUCTION: This exam was performed according to the departmental dose-optimization program which includes automated exposure control, adjustment of the mA and/or kV according to patient size and/or use of iterative reconstruction technique. CONTRAST:  75mL OMNIPAQUE IOHEXOL 350 MG/ML SOLN  COMPARISON:  09/03/2022 FINDINGS: Lower Chest: No acute findings. Hepatobiliary: Several previously demonstrated benign hemangiomas and cysts show no significant change since prior study. Probable tiny gallstones seen, however, there is no No evidence of cholecystitis or biliary ductal dilatation. Pancreas:  No mass or inflammatory changes. Spleen: Within normal limits in size and appearance. Adrenals/Urinary Tract: Severe bilateral diffuse renal parenchymal atrophy again seen, consistent with end-stage renal disease. No suspicious masses identified. No evidence of ureteral calculi or hydronephrosis. Diffuse bladder wall thickening again noted, which may be due to chronic bladder outlet obstruction or cystitis. Stomach/Bowel: No evidence of obstruction, inflammatory process or abnormal fluid collections. Vascular/Lymphatic: No pathologically enlarged lymph nodes. No acute vascular findings. Reproductive:  No mass or other significant abnormality. Other:  None. Musculoskeletal:  No suspicious bone lesions identified. IMPRESSION: No acute findings. Stable diffuse bladder wall thickening, which may be due to chronic bladder outlet obstruction or cystitis. Recommend correlation with urinalysis. Probable tiny gallstones. No radiographic evidence of cholecystitis. Chronic severe bilateral renal atrophy, consistent with end-stage renal disease. No evidence of hydronephrosis. Electronically Signed   By: Danae Orleans M.D.   On: 01/08/2023 17:26     Assessment and Plan: Anthony Hess is a 67 y.o. male with medical history significant of history of hypertension, atrial fibrillation on Eliquis, ESRD on hemodialysis TTS through right tunneled IJ catheter, COPD, peripheral vascular disease presented to the ED complaining of shortness of breath.  Patient significant shortness of breath is due to severe symptomatic anemia, blood loss due to upper GI bleed.  Patient will need close hemodynamic monitoring, hemodialysis and ICU  admission.  Symptomatic anemia Acute blood loss anemia. Hemorrhagic shock Upper GI bleed Admitted to ICU with close monitoring of vitals. Patient will be given 2 units of blood transfusion with close monitoring of H&H. Continue to hold Eliquis or heparin for prophylaxis. Hold home antihypertensive medications. PCCM consultation appreciated. Patient is started on pressors in ED.  Monitor his  BP closely while on HD. NPO for now. GI evaluation and follow-up once he is hemodynamically stable. Repeat H&H ordered, posttransfusion.  Elevated troponin- Likely demand ischemia from severe anemia Will get echocardiogram. Trend troponin. Cardiology evaluation tomorrow.  Lactic acidosis:  Due to hemorrhagic shock. Continue to trend.   ESRD on HD Hyperkalemia- Patient will need urgent dialysis. He is started on pressors, HD setup in ICU. Appreciate nephrology consultation and urgent HD.  COPD Duonebs as needed. Supplemental o2. Not in exacerbation.  Atrial fibrillation: Hold home beta blocker given low BP. Continue telemetry. Hold Eliquis given significant GI bleed.   Advance Care Planning:   Code Status: Full Code discussed with patient at bedside.  Consults: Nephrology, GI, PCCM  Family Communication: I discussed with patient, unable to reach his wife on her phone. Will continue to try to update his clinical status. He is critically ill.  Severity of Illness: The appropriate patient status for this patient is INPATIENT. Inpatient status is judged to be reasonable and necessary in order to provide the required intensity of service to ensure the patient's safety. The patient's presenting symptoms, physical exam findings, and initial radiographic and laboratory data in the context of their chronic comorbidities is felt to place them at high risk for further clinical deterioration. Furthermore, it is not anticipated that the patient will be medically stable for discharge from the  hospital within 2 midnights of admission.   * I certify that at the point of admission it is my clinical judgment that the patient will require inpatient hospital care spanning beyond 2 midnights from the point of admission due to high intensity of service, high risk for further deterioration and high frequency of surveillance required.*  MDM level 3- Patient is very sick with severe anemia due to upper GI bleed, hemodynamic instability, requiring pressors, blood transfusion, ICU admission, urgent HD need, close telemetry, electrolyte, hemodynamic and neurologic monitoring.  He is critically ill and is at high risk for sudden clinical deterioration.  Author: Marcelino Duster, MD 01/08/2023 4:07 PM  For on call review [email or web address].

## 2023-01-08 NOTE — H&P (View-Only) (Signed)
[facility] GI Inpatient Consult Note   Jamey Reas, M.D.  Reason for Consult: Melena, profound symptomatic anemia   Attending Requesting Consult: Marcelino Duster, M.D.                                                       Raechel Chute, M.D. (ICU)   History of Present Illness: Anthony Hess is a 67 y.o. male with a 67 year old male with history of end-stage renal disease on hemodialysis, COPD, peripheral vascular disease, hypertension and atrial fibrillation maintained on Eliquis at home.  He says he has not taken the Eliquis in over 48 hours, however.  Patient has been having significant shortness of breath over the last 4 to 5 days along with blackish stools which caused him to stop the Eliquis.  He is currently in hemodialysis in the ICU due to fluid overload and Hyperkalemia. He is alert and is being treated with pressors to help keep his blood pressure up. He has improved substantially with blood transfusion. He claims he only had two moderate "black stools" at home but none since presenting to the hospital early this afternoon. Patient is naive to both upper endoscopy and colonoscopy.  Past Medical History:  Past Medical History:  Diagnosis Date   Aortic atherosclerosis (HCC)    Bilateral carotid artery disease (HCC)    Bladder cancer (HCC)    Coronary artery disease 12/20/2018   a.) LHC 12/20/2018: 50% OM1, 40% OM2, 95% o-pLAD, 75% o=pLCx, 40% mLM, 70% D1, 60% mRCA-1, 50% mRCA-2; refer to CVTS. b.) 4v CABG at [facility] on 12/27/2018: LIMA-LAD, RIMA-PDA, seg LRA-OM1-D1   DCM (dilated cardiomyopathy) (HCC) 12/05/2018   a.) TTE 12/05/2018: EF 40-45%. b.) TTE 12/28/2019: EF 20-25%.   Dyspnea 10/15/2022   ESRD (end stage renal disease) (HCC)    a.) T-Th-Sat   HFrEF (heart failure with reduced ejection fraction) (HCC) 12/05/2018   a.) TTE 12/05/2018: EF 40-45%; mild LVH; ant/apical/sep HK; mild TR . b.) TTE 12/28/2019: EF 20-25%; mod LVH; mod MR/AR; G1DD.   History of  2019 novel coronavirus disease (COVID-19) 04/08/2021   History of kidney stones    HLD (hyperlipidemia)    Hx of CABG 12/27/2018   Hypertension    Infrarenal abdominal aortic aneurysm (AAA) without rupture (HCC) 03/05/2021   a.) CT abd/pelvis; measured 3.2 cm.   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   a.) LIMA-LAD, RIMA-PDA, sequential LEFT radial artery to OM1 and D1   Sepsis (HCC) 03/14/2021   Wears glasses     Problem List: Patient Active Problem List   Diagnosis Date Noted   Severe anemia 01/08/2023   Hemorrhagic shock (HCC) 01/08/2023   ABLA (acute blood loss anemia) 01/08/2023   Upper GI bleed 01/08/2023   Demand ischemia of myocardium (HCC) 01/08/2023   Gastrointestinal hemorrhage 01/08/2023   Atherosclerosis of native arteries of extremity with rest pain (HCC) 01/05/2023   Ischemic leg pain 12/31/2022   Limb ischemia 12/21/2022   COPD with acute exacerbation (HCC) 11/16/2022   Paroxysmal atrial fibrillation (HCC) 11/16/2022   Problem with vascular access 10/22/2022   Dyspnea 10/15/2022   GERD without esophagitis  10/15/2022   ESRD on hemodialysis (HCC) 09/17/2022   Acute on chronic diastolic CHF (congestive heart failure) (HCC) 09/17/2022   History of pulmonary embolism 09/17/2022   Coronary artery disease 09/17/2022   Essential hypertension 09/17/2022   Acute respiratory failure with hypoxia (HCC) 09/06/2022   Fluid overload 09/06/2022   Hypertensive emergency 09/06/2022   HLD (hyperlipidemia) 09/06/2022   CAD (coronary artery disease) 09/06/2022   Pulmonary embolism (HCC) 09/06/2022   NSTEMI (non-ST elevated myocardial infarction) (HCC) 08/17/2022   (HFpEF) heart failure with preserved ejection fraction (HCC) 08/17/2022   Seasonal allergic rhinitis due to pollen 06/25/2022    Nausea 06/25/2022   Hoarseness of voice 06/25/2022   S/P exploratory laparotomy for perforated duodenum with admission 04/26/22 05/04/2022   Postprocedural intraabdominal abscess (HCC) 05/04/2022   Acute pulmonary embolism (HCC) 04/19/2022   Dyslipidemia 04/19/2022   End-stage renal disease on hemodialysis (HCC) 04/19/2022   Tobacco dependence 04/19/2022   Chronic systolic heart failure (HCC) 04/05/2022   Hypertensive urgency 11/16/2021   Hyperkalemia 11/16/2021   Chronic back pain 11/16/2021   Hypertension secondary to other renal disorders 10/15/2021   Vitamin B12 deficiency anemia due to intrinsic factor deficiency 06/23/2021   Abdominal pain    SBP (spontaneous bacterial peritonitis) (HCC) 04/08/2021   COVID-19 04/08/2021   AAA (abdominal aortic aneurysm) (HCC) 03/18/2021   Hypocalcemia    Leukocytosis 03/05/2021   Spontaneous bacterial peritonitis (HCC) 03/04/2021   Incisional hernia, without obstruction or gangrene    Generalized (acute) peritonitis (HCC) 12/10/2020   Iron deficiency anemia, unspecified 05/06/2020   Moderate protein-calorie malnutrition (HCC) 05/06/2020   Other long term (current) drug therapy 05/06/2020   Unspecified jaundice 05/06/2020   PD catheter dysfunction (HCC) 04/13/2020   Bilateral carotid artery stenosis 02/29/2020   Coronary artery disease involving coronary bypass graft of native heart 02/29/2020   Secondary hyperparathyroidism of renal origin (HCC) 01/30/2020   Acute peritonitis (HCC) 01/08/2020   Hyperlipidemia, mixed 01/04/2020   Mass of left side of neck 01/04/2020   Senile purpura (HCC) 01/04/2020   Hydroureteronephrosis    Atrial fibrillation, chronic (HCC) 01/05/2019   End stage renal disease (HCC) 01/05/2019   Anemia in chronic kidney disease (CODE) 01/05/2019   Presence of aortocoronary bypass graft 12/27/2018   Other emphysema (HCC) 04/14/2018   Bilateral hydronephrosis 04/03/2018   Hypertension 03/27/2018   Cigarette smoker  03/20/2018   History of bladder cancer 03/20/2018    Past Surgical History: Past Surgical History:  Procedure Laterality Date   AV FISTULA PLACEMENT Left 07/30/2021   Procedure: INSERTION OF ARTERIOVENOUS (AV) GORE-TEX GRAFT ARM BRACHIAL ARTERY TO AXILLARY VEIN;  Surgeon: Annice Needy, MD;  Location: [facility];  Service: Vascular;  Laterality: Left;   CAPD INSERTION N/A 12/31/2019   Procedure: LAPAROSCOPIC INSERTION CONTINUOUS AMBULATORY PERITONEAL DIALYSIS  (CAPD) CATHETER;  Surgeon: Leafy Ro, MD;  Location: [facility];  Service: General;  Laterality: N/A;   CAPD REMOVAL N/A 04/10/2020   Procedure: LAPAROSCOPIC REVISION OF CONTINUOUS AMBULATORY PERITONEAL DIALYSIS  (CAPD) CATHETER;  Surgeon: Leafy Ro, MD;  Location: [facility];  Service: General;  Laterality: N/A;   CORONARY ARTERY BYPASS GRAFT N/A 12/27/2018   Procedure: CORONARY ARTERY BYPASS GRAFTING (CABG) X 4 ON PUMP USING RIGHT & LEFT INTERNAL MAMMARY ARTERY LEFT RADIAL ARTERY ENDOSCOPICALLY HARVESTED;  Surgeon: Linden Dolin, MD;  Location: [facility];  Service: Open Heart Surgery;  Laterality: N/A;   CYSTOSCOPY W/ RETROGRADES Bilateral 05/15/2019   Procedure: CYSTOSCOPY WITH RETROGRADE PYELOGRAM;  Surgeon: Lonna Cobb,  Verna Czech, MD;  Location: [facility];  Service: Urology;  Laterality: Bilateral;   CYSTOSCOPY WITH BIOPSY N/A 05/15/2019   Procedure: CYSTOSCOPY WITH bladder BIOPSY;  Surgeon: Riki Altes, MD;  Location: [facility];  Service: Urology;  Laterality: N/A;   DIALYSIS/PERMA CATHETER INSERTION N/A 12/28/2019   Procedure: DIALYSIS/PERMA CATHETER INSERTION;  Surgeon: Annice Needy, MD;  Location: [facility];  Service: Cardiovascular;  Laterality: N/A;   DIALYSIS/PERMA CATHETER INSERTION N/A 03/18/2021   Procedure: DIALYSIS/PERMA CATHETER INSERTION;  Surgeon: Annice Needy, MD;  Location: [facility];  Service: Cardiovascular;  Laterality: N/A;   DIALYSIS/PERMA CATHETER REMOVAL N/A 06/02/2020   Procedure: DIALYSIS/PERMA  CATHETER REMOVAL;  Surgeon: Annice Needy, MD;  Location: [facility];  Service: Cardiovascular;  Laterality: N/A;   DIALYSIS/PERMA CATHETER REPAIR N/A 10/22/2022   Procedure: DIALYSIS/PERMA CATHETER REPAIR;  Surgeon: Renford Dills, MD;  Location: [facility];  Service: Cardiovascular;  Laterality: N/A;   EXCHANGE OF A DIALYSIS CATHETER Right 04/10/2020   Procedure: EXCHANGE OF A DIALYSIS CATHETER;  Surgeon: Leafy Ro, MD;  Location: [facility];  Service: General;  Laterality: Right;   INCISIONAL HERNIA REPAIR  01/20/2021   Procedure: HERNIA REPAIR INCISIONAL;  Surgeon: Henrene Dodge, MD;  Location: [facility];  Service: General;;   IR IMAGE GUIDED DRAINAGE PERCUT CATH  PERITONEAL RETROPERIT  04/07/2020   LAPAROSCOPY N/A 04/16/2021   Procedure: LAPAROSCOPY DIAGNOSTIC;  Surgeon: Sung Amabile, DO;  Location: [facility];  Service: General;  Laterality: N/A;   LAPAROTOMY N/A 04/26/2022   Procedure: EXPLORATORY LAPAROTOMY WITH REPAIR OF DUODENAL ULCER;  Surgeon: Carolan Shiver, MD;  Location: [facility];  Service: General;  Laterality: N/A;   LEFT HEART CATH AND CORONARY ANGIOGRAPHY Left 12/20/2018   Procedure: LEFT HEART CATH AND CORONARY ANGIOGRAPHY;  Surgeon: Marcina Millard, MD;  Location: [facility];  Service: Cardiovascular;  Laterality: Left;   LOWER EXTREMITY ANGIOGRAPHY Right 12/23/2022   Procedure: Lower Extremity Angiography;  Surgeon: Annice Needy, MD;  Location: [facility];  Service: Cardiovascular;  Laterality: Right;   RADIAL ARTERY HARVEST Left 12/27/2018   Procedure: ENDOSCOPIC RADIAL ARTERY HARVEST;  Surgeon: Linden Dolin, MD;  Location: [facility];  Service: Open Heart Surgery;  Laterality: Left;   REMOVAL OF A DIALYSIS CATHETER Left 03/20/2021   Procedure: REMOVAL OF A PD CATHETER;  Surgeon: Annice Needy, MD;  Location: [facility];  Service: Vascular;  Laterality: Left;   REVISION OF ARTERIOVENOUS GORETEX GRAFT Left 09/11/2021   Procedure:  Excisionof infected AV graft;  Surgeon: Renford Dills, MD;  Location: [facility];  Service: Vascular;  Laterality: Left;   TEE WITHOUT CARDIOVERSION N/A 12/27/2018   Procedure: TRANSESOPHAGEAL ECHOCARDIOGRAM (TEE);  Surgeon: Linden Dolin, MD;  Location: [facility] OR;  Service: Open Heart Surgery;  Laterality: N/A;   TUMOR REMOVAL  2019   Bladder    Allergies: No Known Allergies  Home Medications: Medications Prior to Admission  Medication Sig Dispense Refill Last Dose   apixaban (ELIQUIS) 5 MG TABS tablet Take 1 tablet (5 mg total) by mouth 2 (two) times daily. 60 tablet 0 Past Week   aspirin EC 81 MG tablet Take 81 mg by mouth daily.   Past Week   atorvastatin (LIPITOR) 20 MG tablet Take 1 tablet (20 mg total) by mouth daily. 30 tablet 11 Past Week   calcium acetate (PHOSLO) 667 MG capsule Take 1 capsule (667 mg total) by mouth 3 (  three) times daily with meals. 30 capsule 0 Past Week   oxyCODONE-acetaminophen (PERCOCET/ROXICET) 5-325 MG tablet Take 1-2 tablets by mouth every 6 (six) hours as needed for severe pain (pain score 7-10). 40 tablet 0 01/07/2023   pantoprazole (PROTONIX) 40 MG tablet Take 1 tablet (40 mg total) by mouth daily. 30 tablet 0 prn at unknown   sevelamer carbonate (RENVELA) 800 MG tablet Take 1,600 mg by mouth 3 (three) times daily.   Past Week   amLODipine (NORVASC) 2.5 MG tablet Take 1 tablet (2.5 mg total) by mouth daily. (Patient not taking: Reported on 01/08/2023) 30 tablet 0 Not Taking   isosorbide dinitrate (ISORDIL) 10 MG tablet Take 1 tablet (10 mg total) by mouth 3 (three) times daily. (Patient not taking: Reported on 01/08/2023) 90 tablet 0 Not Taking   Home medication reconciliation was completed with the patient.   Scheduled Inpatient Medications:    sodium chloride   Intravenous Once   [START ON 01/09/2023] Chlorhexidine Gluconate Cloth  6 each Topical Q0600   pantoprazole (PROTONIX) IV  40 mg Intravenous Q6H   Followed by   Melene Muller ON 01/11/2023]  pantoprazole (PROTONIX) IV  40 mg Intravenous Q12H   sodium zirconium cyclosilicate  10 g Oral Once    Continuous Inpatient Infusions:    sodium chloride     anticoagulant sodium citrate     desmopressin (DDAVP) 20 mcg in sodium chloride 0.9 % 50 mL IVPB     norepinephrine (LEVOPHED) Adult infusion Stopped (01/08/23 1805)    PRN Inpatient Medications:  alteplase, anticoagulant sodium citrate, feeding supplement (NEPRO CARB STEADY), heparin, lidocaine (PF), lidocaine-prilocaine, pentafluoroprop-tetrafluoroeth  Family History: family history includes Heart failure in his mother.   GI Family History: Negative.  Social History:   reports that he quit smoking about 3 years ago. His smoking use included cigarettes. He started smoking about 2 weeks ago. He has never used smokeless tobacco. He reports that he does not drink alcohol and does not use drugs. The patient denies ETOH, tobacco, or drug use.    Review of Systems: Review of Systems - Negative except HPI  Physical Examination: BP 92/72 (BP Location: Left Arm)   Pulse 90   Temp (!) 97 F (36.1 C) (Rectal)   Resp 15   Ht 5\' 2"  (1.575 m)   Wt 46.6 kg   SpO2 100%   BMI 18.79 kg/m  Physical Exam Cardiovascular:     Rate and Rhythm: Normal rate.  Pulmonary:     Effort: Pulmonary effort is normal.     Breath sounds: Normal breath sounds. No decreased breath sounds or wheezing.  Chest:     Chest wall: No mass or deformity.  Abdominal:     General: Bowel sounds are normal.     Palpations: Abdomen is soft. There is no splenomegaly.     Tenderness: There is no guarding or rebound.  Musculoskeletal:     Cervical back: Normal range of motion.  Skin:    General: Skin is dry.     Capillary Refill: Capillary refill takes 2 to 3 seconds.     Coloration: Skin is cyanotic and pale.  Neurological:     General: No focal deficit present.     Data: Lab Results  Component Value Date   WBC 17.4 (H) 01/08/2023   HGB 4.0 (LL)  01/08/2023   HCT 13.5 (LL) 01/08/2023   MCV 100.7 (H) 01/08/2023   PLT 519 (H) 01/08/2023   Recent Labs  Lab 01/06/23 1040  01/08/23 1324 01/08/23 1426  HGB 12.3* 4.2* 4.0*   Lab Results  Component Value Date   NA 137 01/08/2023   K 6.3 (HH) 01/08/2023   CL 101 01/08/2023   CO2 18 (L) 01/08/2023   BUN 144 (H) 01/08/2023   CREATININE 8.03 (H) 01/08/2023   Lab Results  Component Value Date   ALT 17 01/08/2023   AST 28 01/08/2023   ALKPHOS 45 01/08/2023   BILITOT 0.2 01/08/2023   No results for input(s): "APTT", "INR", "PTT" in the last 168 hours.    Latest Ref Rng & Units 01/08/2023    2:26 PM 01/08/2023    1:24 PM 01/06/2023   10:40 AM  CBC  WBC 4.0 - 10.5 K/uL 17.4  18.4  6.8   Hemoglobin 13.0 - 17.0 g/dL 4.0  4.2  16.1   Hematocrit 39.0 - 52.0 % 13.5  14.3  40.9   Platelets 150 - 400 K/uL 519  505  225     STUDIES: CT Angio Chest PE W/Cm &/Or Wo Cm  Result Date: 01/08/2023 CLINICAL DATA:  Acute shortness of breath, wheezing, and hypoxia. Previous pulmonary embolism. EXAM: CT ANGIOGRAPHY CHEST WITH CONTRAST TECHNIQUE: Multidetector CT imaging of the chest was performed using the standard protocol during bolus administration of intravenous contrast. Multiplanar CT image reconstructions and MIPs were obtained to evaluate the vascular anatomy. RADIATION DOSE REDUCTION: This exam was performed according to the departmental dose-optimization program which includes automated exposure control, adjustment of the mA and/or kV according to patient size and/or use of iterative reconstruction technique. CONTRAST:  75mL OMNIPAQUE IOHEXOL 350 MG/ML SOLN COMPARISON:  10/14/2022 FINDINGS: Cardiovascular: Satisfactory opacification of pulmonary arteries noted. A single tiny nonocclusive embolus is seen in a subsegmental left lower lobe pulmonary artery branch (image 278/6) which is unchanged since previous study. No acute pulmonary embolism identified. No evidence of thoracic aortic  dissection or aneurysm. Mediastinum/Nodes: No masses or pathologically enlarged lymph nodes identified. Lungs/Pleura: Moderate centrilobular emphysema again noted. No pulmonary mass, infiltrate, or effusion. Upper abdomen: No acute findings. Musculoskeletal: No suspicious bone lesions identified. Review of the MIP images confirms the above findings. IMPRESSION: No evidence of acute pulmonary embolism or other acute findings. 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 Emphysema (ICD10-J43.9). Electronically Signed   By: Danae Orleans M.D.   On: 01/08/2023 17:42   CT ABDOMEN PELVIS W CONTRAST  Result Date: 01/08/2023 CLINICAL DATA:  Acute abdominal pain. EXAM: CT ABDOMEN AND PELVIS WITH CONTRAST TECHNIQUE: Multidetector CT imaging of the abdomen and pelvis was performed using the standard protocol following bolus administration of intravenous contrast. RADIATION DOSE REDUCTION: This exam was performed according to the departmental dose-optimization program which includes automated exposure control, adjustment of the mA and/or kV according to patient size and/or use of iterative reconstruction technique. CONTRAST:  75mL OMNIPAQUE IOHEXOL 350 MG/ML SOLN COMPARISON:  09/03/2022 FINDINGS: Lower Chest: No acute findings. Hepatobiliary: Several previously demonstrated benign hemangiomas and cysts show no significant change since prior study. Probable tiny gallstones seen, however, there is no No evidence of cholecystitis or biliary ductal dilatation. Pancreas:  No mass or inflammatory changes. Spleen: Within normal limits in size and appearance. Adrenals/Urinary Tract: Severe bilateral diffuse renal parenchymal atrophy again seen, consistent with end-stage renal disease. No suspicious masses identified. No evidence of ureteral calculi or hydronephrosis. Diffuse bladder wall thickening again noted, which may be due to chronic bladder outlet  obstruction or cystitis. Stomach/Bowel: No evidence of  obstruction, inflammatory process or abnormal fluid collections. Vascular/Lymphatic: No pathologically enlarged lymph nodes. No acute vascular findings. Reproductive:  No mass or other significant abnormality. Other:  None. Musculoskeletal:  No suspicious bone lesions identified. IMPRESSION: No acute findings. Stable diffuse bladder wall thickening, which may be due to chronic bladder outlet obstruction or cystitis. Recommend correlation with urinalysis. Probable tiny gallstones. No radiographic evidence of cholecystitis. Chronic severe bilateral renal atrophy, consistent with end-stage renal disease. No evidence of hydronephrosis. Electronically Signed   By: Danae Orleans M.D.   On: 01/08/2023 17:26   @IMAGES @  Assessment:  Acute hemorrhagic shock. Likely from acute gastrointestinal bleeding. Acute GI bleed - likely UGI source given presentation. Hypotension -stabilizing with IV resuscitaion, and blood transfusion. Chronic atrial fibrillation - rate lower after IV resuscitation. CAD s/p CABG End Stage Renal disease on hemodialysis. Hyperkalemia - treated with hemodialysis. Repeat K+ pending. CHF Hx of pulmonary embolism    Recommendations:  Continue IV protonix. Serial H/H. Serial exams. EGD when clinically feasible. Will tentatively place for tomorrow AM if medically stable at that time. The patient understands the nature of the planned procedure, indications, risks, alternatives and potential complications including but not limited to bleeding, infection, perforation, damage to internal organs and possible oversedation/side effects from anesthesia. The patient agrees and gives consent to proceed.  Please refer to procedure notes for findings, recommendations and patient disposition/instructions. Will follow along closely.  Thank you for the consult. Please call with questions or concerns.  Rosina Lowenstein, "Mellody Dance MD [facility] [address] [place], [place] [address] [phone]  01/08/2023 6:24 PM

## 2023-01-08 NOTE — ED Notes (Signed)
Per nephrologist pt will receive the ordered units of blood through dialysis access. Dialysis RN is setting up for dialysis in room now.

## 2023-01-08 NOTE — ED Triage Notes (Signed)
PT BIB [facility] coming from home with c/o of ShOB. EMS report pt was wheezing and SpO2 was in the low 90's. They admin one albuterol tx and one duoneb. Pt with hx of DVT, dialysis. Pt missed dialysis today and last tx was on Thursday.   EMS Vitals   126/57 SpO2 94% on aerosol mask HR 72 RR 23 ETCO2 20

## 2023-01-09 ENCOUNTER — Inpatient Hospital Stay: Payer: Medicare Other | Admitting: Anesthesiology

## 2023-01-09 ENCOUNTER — Encounter
Admission: EM | Disposition: A | Discharge status: Home / Self Care | Payer: Self-pay | Source: Home / Self Care | Attending: Internal Medicine

## 2023-01-09 DIAGNOSIS — I2489 Other forms of acute ischemic heart disease: Secondary | ICD-10-CM | POA: Diagnosis not present

## 2023-01-09 DIAGNOSIS — N186 End stage renal disease: Secondary | ICD-10-CM | POA: Diagnosis not present

## 2023-01-09 DIAGNOSIS — D649 Anemia, unspecified: Secondary | ICD-10-CM | POA: Diagnosis not present

## 2023-01-09 DIAGNOSIS — Z01818 Encounter for other preprocedural examination: Secondary | ICD-10-CM | POA: Diagnosis not present

## 2023-01-09 DIAGNOSIS — I214 Non-ST elevation (NSTEMI) myocardial infarction: Secondary | ICD-10-CM | POA: Diagnosis not present

## 2023-01-09 DIAGNOSIS — K922 Gastrointestinal hemorrhage, unspecified: Secondary | ICD-10-CM | POA: Diagnosis not present

## 2023-01-09 HISTORY — PX: HEMOSTASIS CONTROL: SHX6838

## 2023-01-09 HISTORY — PX: ESOPHAGOGASTRODUODENOSCOPY: SHX5428

## 2023-01-09 HISTORY — PX: SUBMUCOSAL INJECTION: SHX5543

## 2023-01-09 LAB — BPAM FFP
Blood Product Expiration Date: 202411212359
ISSUE DATE / TIME: 202411162019
Unit Type and Rh: 600

## 2023-01-09 LAB — CBC
HCT: 21.7 % — ABNORMAL LOW (ref 39.0–52.0)
Hemoglobin: 7.4 g/dL — ABNORMAL LOW (ref 13.0–17.0)
MCH: 30 pg (ref 26.0–34.0)
MCHC: 34.1 g/dL (ref 30.0–36.0)
MCV: 87.9 fL (ref 80.0–100.0)
Platelets: 307 10*3/uL (ref 150–400)
RBC: 2.47 MIL/uL — ABNORMAL LOW (ref 4.22–5.81)
RDW: 16.9 % — ABNORMAL HIGH (ref 11.5–15.5)
WBC: 15.7 10*3/uL — ABNORMAL HIGH (ref 4.0–10.5)
nRBC: 0.6 % — ABNORMAL HIGH (ref 0.0–0.2)

## 2023-01-09 LAB — BASIC METABOLIC PANEL
Anion gap: 13 (ref 5–15)
BUN: 49 mg/dL — ABNORMAL HIGH (ref 8–23)
CO2: 27 mmol/L (ref 22–32)
Calcium: 7.4 mg/dL — ABNORMAL LOW (ref 8.9–10.3)
Chloride: 95 mmol/L — ABNORMAL LOW (ref 98–111)
Creatinine, Ser: 3.81 mg/dL — ABNORMAL HIGH (ref 0.61–1.24)
GFR, Estimated: 17 mL/min — ABNORMAL LOW (ref >60)
Glucose, Bld: 89 mg/dL (ref 70–99)
Potassium: 3.7 mmol/L (ref 3.5–5.1)
Sodium: 135 mmol/L (ref 135–145)

## 2023-01-09 LAB — PREPARE FRESH FROZEN PLASMA: Unit division: 0

## 2023-01-09 LAB — HEMOGLOBIN AND HEMATOCRIT, BLOOD
HCT: 24.6 % — ABNORMAL LOW (ref 39.0–52.0)
HCT: 25.7 % — ABNORMAL LOW (ref 39.0–52.0)
Hemoglobin: 8.3 g/dL — ABNORMAL LOW (ref 13.0–17.0)
Hemoglobin: 8.6 g/dL — ABNORMAL LOW (ref 13.0–17.0)

## 2023-01-09 LAB — PREPARE RBC (CROSSMATCH)

## 2023-01-09 SURGERY — EGD (ESOPHAGOGASTRODUODENOSCOPY)
Anesthesia: General

## 2023-01-09 MED ORDER — PROPOFOL 10 MG/ML IV BOLUS
INTRAVENOUS | Status: DC | PRN
  Administered 2023-01-09: 50 mg via INTRAVENOUS

## 2023-01-09 MED ORDER — EPOETIN ALFA 4000 UNIT/ML IJ SOLN
4000.0000 [IU] | INTRAMUSCULAR | Status: DC
  Administered 2023-01-11 – 2023-01-13 (×2): 4000 [IU] via INTRAVENOUS
  Filled 2023-01-09 (×2): qty 1

## 2023-01-09 MED ORDER — SODIUM CHLORIDE 0.9% IV SOLUTION
Freq: Once | INTRAVENOUS | Status: DC
Start: 2023-01-09 — End: 2023-01-10

## 2023-01-09 MED ORDER — PROPOFOL 500 MG/50ML IV EMUL
INTRAVENOUS | Status: DC | PRN
  Administered 2023-01-09: 100 ug/kg/min via INTRAVENOUS

## 2023-01-09 MED ORDER — DEXMEDETOMIDINE HCL IN NACL 80 MCG/20ML IV SOLN
INTRAVENOUS | Status: DC | PRN
  Administered 2023-01-09: 8 ug via INTRAVENOUS
  Administered 2023-01-09: 4 ug via INTRAVENOUS

## 2023-01-09 MED ORDER — EPINEPHRINE 1 MG/10ML IJ SOSY
PREFILLED_SYRINGE | INTRAMUSCULAR | Status: AC
  Filled 2023-01-09: qty 10

## 2023-01-09 MED ORDER — LIDOCAINE HCL (CARDIAC) PF 100 MG/5ML IV SOSY
PREFILLED_SYRINGE | INTRAVENOUS | Status: DC | PRN
  Administered 2023-01-09: 60 mg via INTRAVENOUS

## 2023-01-09 MED ORDER — EPINEPHRINE 1 MG/ML IJ SOLN
INTRAMUSCULAR | Status: DC | PRN
  Administered 2023-01-09: .1 mg via ENDOTRACHEOPULMONARY

## 2023-01-09 NOTE — Transfer of Care (Signed)
Immediate Anesthesia Transfer of Care Note  Patient: Anthony Hess  Procedure(s) Performed: ESOPHAGOGASTRODUODENOSCOPY (EGD)  Patient Location: PACU  Anesthesia Type:General  Level of Consciousness: awake, alert , and oriented  Airway & Oxygen Therapy: Patient Spontanous Breathing  Post-op Assessment: Report given to RN and Post -op Vital signs reviewed and stable  Post vital signs: Reviewed and stable  Last Vitals:  Vitals Value Taken Time  BP 87/63 01/09/23 0939  Temp    Pulse 76 01/09/23 0939  Resp 23 01/09/23 0939  SpO2 100 % 01/09/23 0939    Last Pain:  Vitals:   01/09/23 0939  TempSrc:   PainSc: 0-No pain         Complications: No notable events documented.

## 2023-01-09 NOTE — Consult Note (Addendum)
Anthony Hess is a 67 y.o. male  [id number]  Primary Cardiologist: [facility] cardiology Reason for Consultation: Elevated troponin needing clearance for EGD  HPI: This is a 67 year old white male with a past medical history of end-stage renal disease, history of CABG, and atrial fibrillation on Eliquis presented to the hospital with a hemoglobin of 4 with rectal bright red bleeding.  Patient was having shortness of breath on minimal exertion but no orthopnea PND or leg swelling.  Patient denied any chest pain at any point.  Currently no longer short of breath and resting comfortably.   Review of Systems: No orthopnea PND or leg swelling   Past Medical History:  Diagnosis Date   Aortic atherosclerosis (HCC)    Bilateral carotid artery disease (HCC)    Bladder cancer (HCC)    Coronary artery disease 12/20/2018   a.) LHC 12/20/2018: 50% OM1, 40% OM2, 95% o-pLAD, 75% o=pLCx, 40% mLM, 70% D1, 60% mRCA-1, 50% mRCA-2; refer to CVTS. b.) 4v CABG at [facility] on 12/27/2018: LIMA-LAD, RIMA-PDA, seg LRA-OM1-D1   DCM (dilated cardiomyopathy) (HCC) 12/05/2018   a.) TTE 12/05/2018: EF 40-45%. b.) TTE 12/28/2019: EF 20-25%.   Dyspnea 10/15/2022   ESRD (end stage renal disease) (HCC)    a.) T-Th-Sat   HFrEF (heart failure with reduced ejection fraction) (HCC) 12/05/2018   a.) TTE 12/05/2018: EF 40-45%; mild LVH; ant/apical/sep HK; mild TR . b.) TTE 12/28/2019: EF 20-25%; mod LVH; mod MR/AR; G1DD.   History of 2019 novel coronavirus disease (COVID-19) 04/08/2021   History of kidney stones    HLD (hyperlipidemia)    Hx of CABG 12/27/2018   Hypertension    Infrarenal abdominal aortic aneurysm (AAA) without rupture (HCC) 03/05/2021   a.) CT abd/pelvis; measured 3.2 cm.   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 60 tablet 0   aspirin EC 81 MG tablet Take 81 mg by mouth daily.     atorvastatin (LIPITOR) 20 MG tablet Take 1 tablet (20 mg total) by mouth daily. 30 tablet 11   calcium acetate (PHOSLO) 667 MG capsule Take 1 capsule (667 mg total) by mouth 3 (three) times daily with meals. 30 capsule 0   oxyCODONE-acetaminophen (PERCOCET/ROXICET) 5-325 MG tablet Take 1-2 tablets by mouth every 6 (six) hours as needed for severe pain (pain score 7-10). 40 tablet 0   pantoprazole (PROTONIX) 40 MG tablet Take 1 tablet (40 mg total) by mouth daily. 30 tablet 0   sevelamer carbonate (RENVELA) 800 MG tablet Take 1,600 mg by mouth 3 (three) times daily.     amLODipine (NORVASC) 2.5 MG tablet Take 1 tablet (2.5 mg total) by mouth daily. (Patient not taking: Reported on 01/08/2023) 30 tablet 0   isosorbide dinitrate (ISORDIL) 10 MG tablet Take 1 tablet (10 mg total) by mouth 3 (three) times daily. (Patient not taking: Reported on 01/08/2023) 90 tablet 0      sodium chloride   Intravenous Once   sodium chloride   Intravenous Once  Chlorhexidine Gluconate Cloth  6 each Topical Q0600   pantoprazole (PROTONIX) IV  40 mg Intravenous Q6H   sodium zirconium cyclosilicate  10 g Oral Once    Infusions:  sodium chloride     anticoagulant sodium citrate     norepinephrine (LEVOPHED) Adult infusion Stopped (01/08/23 2203)    No Known Allergies  Social History   Socioeconomic History   Marital status: Married    Spouse name: Not on file   Number of children: Not on file   Years of education: Not on file   Highest education level: Not on file  Occupational History   Not on file  Tobacco Use   Smoking status: Former    Types: Cigarettes    Start date:  12/2022    Quit date: 02/19/2019    Years since quitting: 3.8   Smokeless tobacco: Never  Vaping Use   Vaping status: Never Used  Substance and Sexual Activity   Alcohol use: Never   Drug use: Never   Sexual activity: Yes  Other Topics Concern   Not on file  Social History Narrative   Not on file   Social Determinants of Health   Financial Resource Strain: Low Risk  (08/23/2022)   Overall Financial Resource Strain (CARDIA)    Difficulty of Paying Living Expenses: Not very hard  Food Insecurity: No Food Insecurity (12/21/2022)   Hunger Vital Sign    Worried About Running Out of Food in the Last Year: Never true    Ran Out of Food in the Last Year: Never true  Recent Concern: Food Insecurity - Food Insecurity Present (11/16/2022)   Hunger Vital Sign    Worried About Running Out of Food in the Last Year: Often true    Ran Out of Food in the Last Year: Often true  Transportation Needs: No Transportation Needs (12/21/2022)   PRAPARE - Administrator, Civil Service (Medical): No    Lack of Transportation (Non-Medical): No  Recent Concern: Transportation Needs - Unmet Transportation Needs (11/16/2022)   PRAPARE - Transportation    Lack of Transportation (Medical): Yes    Lack of Transportation (Non-Medical): Yes  Physical Activity: Inactive (08/23/2022)   Exercise Vital Sign    Days of Exercise per Week: 0 days    Minutes of Exercise per Session: 0 min  Stress: No Stress Concern Present (08/23/2022)   [employer] of Occupational Health - Occupational Stress Questionnaire    Feeling of Stress : Not at all  Social Connections: Moderately Integrated (08/23/2022)   Social Connection and Isolation Panel [NHANES]    Frequency of Communication with Friends and Family: More than three times a week    Frequency of Social Gatherings with Friends and Family: Never    Attends Religious Services: More than 4 times per year    Active Member of [employer] or Organizations: No    Attends  Banker Meetings: Never    Marital Status: Married  Catering manager Violence: Not At Risk (12/21/2022)   Humiliation, Afraid, Rape, and Kick questionnaire    Fear of Current or Ex-Partner: No    Emotionally Abused: No    Physically Abused: No    Sexually Abused: No    Family History  Problem Relation Age of Onset   Heart failure Mother     PHYSICAL EXAM: Vitals:   01/09/23 0645 01/09/23 0741  BP: (!) 98/53 104/66  Pulse: (!) 40   Resp: 12   Temp: 99.1 F (37.3  C)   SpO2: 97%      Intake/Output Summary (Last 24 hours) at 01/09/2023 9147 Last data filed at 01/08/2023 2245 Gross per 24 hour  Intake 1483.26 ml  Output 500 ml  Net 983.26 ml    General:  Well appearing. No respiratory difficulty HEENT: normal Neck: supple. no JVD. Carotids 2+ bilat; no bruits. No lymphadenopathy or thryomegaly appreciated. Cor: PMI nondisplaced. Regular rate & rhythm. No rubs, gallops or murmurs. Lungs: clear Abdomen: soft, nontender, nondistended. No hepatosplenomegaly. No bruits or masses. Good bowel sounds. Extremities: no cyanosis, clubbing, rash, edema Neuro: alert & oriented x 3, cranial nerves grossly intact. moves all 4 extremities w/o difficulty. Affect pleasant.  ECG: Normal sinus rhythm with right bundle branch block and left anterior hemiblock with nonspecific ST-T changes  Results for orders placed or performed during the hospital encounter of 01/08/23 (from the past 24 hour(s))  CBC with Differential     Status: Abnormal   Collection Time: 01/08/23  1:24 PM  Result Value Ref Range   WBC 18.4 (H) 4.0 - 10.5 K/uL   RBC 1.43 (L) 4.22 - 5.81 MIL/uL   Hemoglobin 4.2 (LL) 13.0 - 17.0 g/dL   HCT 82.9 (LL) 56.2 - 52.0 %   MCV 100.0 80.0 - 100.0 fL   MCH 29.4 26.0 - 34.0 pg   MCHC 29.4 (L) 30.0 - 36.0 g/dL   RDW 13.0 (H) 86.5 - 78.4 %   Platelets 505 (H) 150 - 400 K/uL   nRBC 0.2 0.0 - 0.2 %   Neutrophils Relative % 85 %   Neutro Abs 15.6 (H) 1.7 - 7.7 K/uL    Lymphocytes Relative 7 %   Lymphs Abs 1.3 0.7 - 4.0 K/uL   Monocytes Relative 6 %   Monocytes Absolute 1.2 (H) 0.1 - 1.0 K/uL   Eosinophils Relative 0 %   Eosinophils Absolute 0.0 0.0 - 0.5 K/uL   Basophils Relative 0 %   Basophils Absolute 0.0 0.0 - 0.1 K/uL   Immature Granulocytes 2 %   Abs Immature Granulocytes 0.30 (H) 0.00 - 0.07 K/uL  Comprehensive metabolic panel     Status: Abnormal   Collection Time: 01/08/23  1:24 PM  Result Value Ref Range   Sodium 137 135 - 145 mmol/L   Potassium 6.3 (HH) 3.5 - 5.1 mmol/L   Chloride 101 98 - 111 mmol/L   CO2 18 (L) 22 - 32 mmol/L   Glucose, Bld 141 (H) 70 - 99 mg/dL   BUN 696 (H) 8 - 23 mg/dL   Creatinine, Ser 2.95 (H) 0.61 - 1.24 mg/dL   Calcium 7.6 (L) 8.9 - 10.3 mg/dL   Total Protein 5.2 (L) 6.5 - 8.1 g/dL   Albumin 2.5 (L) 3.5 - 5.0 g/dL   AST 28 15 - 41 U/L   ALT 17 0 - 44 U/L   Alkaline Phosphatase 45 38 - 126 U/L   Total Bilirubin 0.2 <1.2 mg/dL   GFR, Estimated 7 (L) >60 mL/min   Anion gap 18 (H) 5 - 15  Lipase, blood     Status: None   Collection Time: 01/08/23  1:24 PM  Result Value Ref Range   Lipase 28 11 - 51 U/L  Troponin I (High Sensitivity)     Status: Abnormal   Collection Time: 01/08/23  1:24 PM  Result Value Ref Range   Troponin I (High Sensitivity) 3,803 (HH) <18 ng/L  CBC with Differential     Status: Abnormal   Collection Time:  01/08/23  2:26 PM  Result Value Ref Range   WBC 17.4 (H) 4.0 - 10.5 K/uL   RBC 1.34 (L) 4.22 - 5.81 MIL/uL   Hemoglobin 4.0 (LL) 13.0 - 17.0 g/dL   HCT 86.5 (LL) 78.4 - 52.0 %   MCV 100.7 (H) 80.0 - 100.0 fL   MCH 29.9 26.0 - 34.0 pg   MCHC 29.6 (L) 30.0 - 36.0 g/dL   RDW 69.6 (H) 29.5 - 28.4 %   Platelets 519 (H) 150 - 400 K/uL   nRBC 0.2 0.0 - 0.2 %   Neutrophils Relative % 86 %   Neutro Abs 14.9 (H) 1.7 - 7.7 K/uL   Lymphocytes Relative 6 %   Lymphs Abs 1.1 0.7 - 4.0 K/uL   Monocytes Relative 6 %   Monocytes Absolute 1.0 0.1 - 1.0 K/uL   Eosinophils Relative 0 %    Eosinophils Absolute 0.0 0.0 - 0.5 K/uL   Basophils Relative 0 %   Basophils Absolute 0.0 0.0 - 0.1 K/uL   Immature Granulocytes 2 %   Abs Immature Granulocytes 0.31 (H) 0.00 - 0.07 K/uL  Type and screen [facility]     Status: None (Preliminary result)   Collection Time: 01/08/23  2:26 PM  Result Value Ref Range   ABO/RH(D) A POS    Antibody Screen NEG    Sample Expiration      01/11/2023,2359 Performed at [facility], [address], [place], [place] [address]    Unit Number [id number]    Blood Component Type RBC LR PHER1    Unit division 00    Status of Unit ISSUED    Transfusion Status OK TO TRANSFUSE    Crossmatch Result Compatible    Unit Number [id number]    Blood Component Type RBC LR PHER2    Unit division 00    Status of Unit ISSUED    Transfusion Status OK TO TRANSFUSE    Crossmatch Result Compatible    Unit Number [id number]    Blood Component Type RED CELLS,LR    Unit division 00    Status of Unit ISSUED    Transfusion Status OK TO TRANSFUSE    Crossmatch Result Compatible    Unit Number [id number]    Blood Component Type RED CELLS,LR    Unit division 00    Status of Unit ALLOCATED    Transfusion Status OK TO TRANSFUSE    Crossmatch Result Compatible   Prepare RBC (crossmatch)     Status: None   Collection Time: 01/08/23  2:45 PM  Result Value Ref Range   Order Confirmation      ORDER PROCESSED BY BLOOD BANK Performed at [facility], [address] [place], [place] [address]   Lactic acid, plasma     Status: Abnormal   Collection Time: 01/08/23  3:35 PM  Result Value Ref Range   Lactic Acid, Venous 5.2 (HH) 0.5 - 1.9 mmol/L  Troponin I (High Sensitivity)     Status: Abnormal   Collection Time: 01/08/23  3:35 PM  Result Value Ref Range   Troponin I (High Sensitivity) 3,348 (HH) <18 ng/L  MRSA Next Gen by PCR, Nasal     Status: None   Collection Time: 01/08/23  5:19 PM   Specimen: Nasal Mucosa;  Nasal Swab  Result Value Ref Range   MRSA by PCR Next Gen NOT DETECTED NOT DETECTED  Glucose, capillary     Status: Abnormal   Collection Time: 01/08/23  5:26 PM  Result Value Ref Range   Glucose-Capillary 112 (H) 70 - 99 mg/dL  Lactic acid, plasma     Status: Abnormal   Collection Time: 01/08/23  5:40 PM  Result Value Ref Range   Lactic Acid, Venous 5.5 (HH) 0.5 - 1.9 mmol/L  Hepatitis B surface antigen     Status: None   Collection Time: 01/08/23  5:40 PM  Result Value Ref Range   Hepatitis B Surface Ag NON REACTIVE NON REACTIVE  Prepare RBC (crossmatch)     Status: None   Collection Time: 01/08/23  5:54 PM  Result Value Ref Range   Order Confirmation      ORDER PROCESSED BY BLOOD BANK Performed at [facility], [address] [place], [place] [address]   Prepare fresh frozen plasma     Status: None (Preliminary result)   Collection Time: 01/08/23  5:54 PM  Result Value Ref Range   Unit Number [id number]    Blood Component Type THAWED PLASMA    Unit division 00    Status of Unit ISSUED    Transfusion Status      OK TO TRANSFUSE Performed at [facility], [address] [place], [place] [address]   Troponin I (High Sensitivity)     Status: Abnormal   Collection Time: 01/08/23 10:55 PM  Result Value Ref Range   Troponin I (High Sensitivity) 9,484 (HH) <18 ng/L  CBC with Differential/Platelet     Status: Abnormal   Collection Time: 01/08/23 10:55 PM  Result Value Ref Range   WBC 17.3 (H) 4.0 - 10.5 K/uL   RBC 2.56 (L) 4.22 - 5.81 MIL/uL   Hemoglobin 7.7 (L) 13.0 - 17.0 g/dL   HCT 84.6 (L) 96.2 - 95.2 %   MCV 86.7 80.0 - 100.0 fL   MCH 30.1 26.0 - 34.0 pg   MCHC 34.7 30.0 - 36.0 g/dL   RDW 84.1 (H) 32.4 - 40.1 %   Platelets 311 150 - 400 K/uL   nRBC 0.3 (H) 0.0 - 0.2 %   Neutrophils Relative % 84 %   Neutro Abs 14.7 (H) 1.7 - 7.7 K/uL   Lymphocytes Relative 6 %   Lymphs Abs 1.0 0.7 - 4.0 K/uL   Monocytes Relative 8 %   Monocytes Absolute  1.4 (H) 0.1 - 1.0 K/uL   Eosinophils Relative 0 %   Eosinophils Absolute 0.0 0.0 - 0.5 K/uL   Basophils Relative 0 %   Basophils Absolute 0.0 0.0 - 0.1 K/uL   Immature Granulocytes 2 %   Abs Immature Granulocytes 0.27 (H) 0.00 - 0.07 K/uL  Protime-INR     Status: Abnormal   Collection Time: 01/08/23 10:55 PM  Result Value Ref Range   Prothrombin Time 15.7 (H) 11.4 - 15.2 seconds   INR 1.2 0.8 - 1.2  Basic metabolic panel     Status: Abnormal   Collection Time: 01/08/23 10:55 PM  Result Value Ref Range   Sodium 135 135 - 145 mmol/L   Potassium 3.2 (L) 3.5 - 5.1 mmol/L   Chloride 96 (L) 98 - 111 mmol/L   CO2 24 22 - 32 mmol/L   Glucose, Bld 96 70 - 99 mg/dL   BUN 44 (H) 8 - 23 mg/dL   Creatinine, Ser 0.27 (H) 0.61 - 1.24 mg/dL   Calcium 7.6 (L) 8.9 - 10.3 mg/dL   GFR, Estimated 21 (L) >60 mL/min   Anion gap 15 5 - 15  CBC  Status: Abnormal   Collection Time: 01/09/23  4:48 AM  Result Value Ref Range   WBC 15.7 (H) 4.0 - 10.5 K/uL   RBC 2.47 (L) 4.22 - 5.81 MIL/uL   Hemoglobin 7.4 (L) 13.0 - 17.0 g/dL   HCT 40.9 (L) 81.1 - 91.4 %   MCV 87.9 80.0 - 100.0 fL   MCH 30.0 26.0 - 34.0 pg   MCHC 34.1 30.0 - 36.0 g/dL   RDW 78.2 (H) 95.6 - 21.3 %   Platelets 307 150 - 400 K/uL   nRBC 0.6 (H) 0.0 - 0.2 %  Basic metabolic panel     Status: Abnormal   Collection Time: 01/09/23  4:48 AM  Result Value Ref Range   Sodium 135 135 - 145 mmol/L   Potassium 3.7 3.5 - 5.1 mmol/L   Chloride 95 (L) 98 - 111 mmol/L   CO2 27 22 - 32 mmol/L   Glucose, Bld 89 70 - 99 mg/dL   BUN 49 (H) 8 - 23 mg/dL   Creatinine, Ser 0.86 (H) 0.61 - 1.24 mg/dL   Calcium 7.4 (L) 8.9 - 10.3 mg/dL   GFR, Estimated 17 (L) >60 mL/min   Anion gap 13 5 - 15  Prepare RBC (crossmatch)     Status: None   Collection Time: 01/09/23  7:39 AM  Result Value Ref Range   Order Confirmation      ORDER PROCESSED BY BLOOD BANK Performed at [facility], [address] [place], [place] [address]    CT Angio  Chest PE W/Cm &/Or Wo Cm  Result Date: 01/08/2023 CLINICAL DATA:  Acute shortness of breath, wheezing, and hypoxia. Previous pulmonary embolism. EXAM: CT ANGIOGRAPHY CHEST WITH CONTRAST TECHNIQUE: Multidetector CT imaging of the chest was performed using the standard protocol during bolus administration of intravenous contrast. Multiplanar CT image reconstructions and MIPs were obtained to evaluate the vascular anatomy. RADIATION DOSE REDUCTION: This exam was performed according to the departmental dose-optimization program which includes automated exposure control, adjustment of the mA and/or kV according to patient size and/or use of iterative reconstruction technique. CONTRAST:  75mL OMNIPAQUE IOHEXOL 350 MG/ML SOLN COMPARISON:  10/14/2022 FINDINGS: Cardiovascular: Satisfactory opacification of pulmonary arteries noted. A single tiny nonocclusive embolus is seen in a subsegmental left lower lobe pulmonary artery branch (image 278/6) which is unchanged since previous study. No acute pulmonary embolism identified. No evidence of thoracic aortic dissection or aneurysm. Mediastinum/Nodes: No masses or pathologically enlarged lymph nodes identified. Lungs/Pleura: Moderate centrilobular emphysema again noted. No pulmonary mass, infiltrate, or effusion. Upper abdomen: No acute findings. Musculoskeletal: No suspicious bone lesions identified. Review of the MIP images confirms the above findings. IMPRESSION: No evidence of acute pulmonary embolism or other acute findings. 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 Emphysema (ICD10-J43.9). Electronically Signed   By: Danae Orleans M.D.   On: 01/08/2023 17:42   CT ABDOMEN PELVIS W CONTRAST  Result Date: 01/08/2023 CLINICAL DATA:  Acute abdominal pain. EXAM: CT ABDOMEN AND PELVIS WITH CONTRAST TECHNIQUE: Multidetector CT imaging of the abdomen and pelvis was performed using the standard  protocol following bolus administration of intravenous contrast. RADIATION DOSE REDUCTION: This exam was performed according to the departmental dose-optimization program which includes automated exposure control, adjustment of the mA and/or kV according to patient size and/or use of iterative reconstruction technique. CONTRAST:  75mL OMNIPAQUE IOHEXOL 350 MG/ML SOLN COMPARISON:  09/03/2022 FINDINGS: Lower Chest: No acute findings. Hepatobiliary: Several previously  demonstrated benign hemangiomas and cysts show no significant change since prior study. Probable tiny gallstones seen, however, there is no No evidence of cholecystitis or biliary ductal dilatation. Pancreas:  No mass or inflammatory changes. Spleen: Within normal limits in size and appearance. Adrenals/Urinary Tract: Severe bilateral diffuse renal parenchymal atrophy again seen, consistent with end-stage renal disease. No suspicious masses identified. No evidence of ureteral calculi or hydronephrosis. Diffuse bladder wall thickening again noted, which may be due to chronic bladder outlet obstruction or cystitis. Stomach/Bowel: No evidence of obstruction, inflammatory process or abnormal fluid collections. Vascular/Lymphatic: No pathologically enlarged lymph nodes. No acute vascular findings. Reproductive:  No mass or other significant abnormality. Other:  None. Musculoskeletal:  No suspicious bone lesions identified. IMPRESSION: No acute findings. Stable diffuse bladder wall thickening, which may be due to chronic bladder outlet obstruction or cystitis. Recommend correlation with urinalysis. Probable tiny gallstones. No radiographic evidence of cholecystitis. Chronic severe bilateral renal atrophy, consistent with end-stage renal disease. No evidence of hydronephrosis. Electronically Signed   By: Danae Orleans M.D.   On: 01/08/2023 17:26     ASSESSMENT AND PLAN: Elevated peak troponin to 9432, but no chest pain and no ST elevation on EKG with  nonspecific ST-T changes.  Currently patient is not short of breath.  Patient has a prior history of being on Eliquis and had a hemoglobin of 4 but right now after 2 units is 7.4 and is feeling much better.   Most likely the elevated troponin is due to severe hemoglobin drop from 11 hemoglobin to 4.  Since he is feeling much better and EGD is a low risk procedure, advise proceeding today.  At this point would not start the patient back on Ellquis after procedure..  Advise also getting echocardiogram to look for wall motion and ejection fraction.  But no need to delay EGD.  Will follow the patient closely with you.  Thank you very much for referral.  Adrian Blackwater

## 2023-01-09 NOTE — Anesthesia Postprocedure Evaluation (Signed)
Anesthesia Post Note  Patient: Heyward Meckley  Procedure(s) Performed: ESOPHAGOGASTRODUODENOSCOPY (EGD) HEMOSTASIS CONTROL SUBMUCOSAL INJECTION  Patient location during evaluation: PACU Anesthesia Type: General Level of consciousness: awake and alert Pain management: pain level controlled Vital Signs Assessment: post-procedure vital signs reviewed and stable Respiratory status: spontaneous breathing, nonlabored ventilation, respiratory function stable and patient connected to nasal cannula oxygen Cardiovascular status: blood pressure returned to baseline and stable Postop Assessment: no apparent nausea or vomiting Anesthetic complications: no  No notable events documented.   Last Vitals:  Vitals:   01/09/23 1600 01/09/23 1700  BP:  95/70  Pulse: (!) 35 (!) 35  Resp: 15 13  Temp: 37.4 C 37.3 C  SpO2: 96% 96%    Last Pain:  Vitals:   01/09/23 1200  TempSrc:   PainSc: 0-No pain                 Stephanie Coup

## 2023-01-09 NOTE — Op Note (Signed)
[facility] Gastroenterology Patient Name: Anthony Hess Procedure Date: 01/09/2023 8:45 AM MRN: [id number] Account #: [id number] Date of Birth: 07/21/1955 Admit Type: Inpatient Age: 67 Room: [facility] ENDO ROOM 1 Gender: Male Note Status: Finalized Instrument Name: Upper Endoscope [id number] Procedure:             Upper GI endoscopy Indications:           Acute post hemorrhagic anemia, Melena Providers:             Boykin Nearing. Norma Fredrickson MD, MD Referring MD:          Nicki Guadalajara (Referring MD) Medicines:             Propofol per Anesthesia Complications:         No immediate complications. Estimated blood loss:                         Minimal. Procedure:             Pre-Anesthesia Assessment:                        - The risks and benefits of the procedure and the                         sedation options and risks were discussed with the                         patient. All questions were answered and informed                         consent was obtained.                        - Patient identification and proposed procedure were                         verified prior to the procedure by the nurse. The                         procedure was verified in the procedure room.                        - ASA Grade Assessment: III - A patient with severe                         systemic disease.                        - After reviewing the risks and benefits, the patient                         was deemed in satisfactory condition to undergo the                         procedure.                        After obtaining informed consent, the endoscope was                         passed under  direct vision. Throughout the procedure,                         the patient's blood pressure, pulse, and oxygen                         saturations were monitored continuously. The Endoscope                         was introduced through the mouth, and advanced to the                          third part of duodenum. The upper GI endoscopy was                         accomplished without difficulty. The patient tolerated                         the procedure well. Findings:      The esophagus was normal.      Diffuse mild inflammation characterized by congestion (edema) was found       in the entire examined stomach.      A 2 cm hiatal hernia was present.      The exam of the stomach was otherwise normal.      The second portion of the duodenum and third portion of the duodenum       were normal.      One oozing cratered duodenal ulcer with pigmented material was found in       the duodenal bulb. The lesion was 15 mm in largest dimension. Area was       unsuccessfully injected with 1 mL of a 0.1 mg/mL solution of epinephrine       for hemostasis of bleeding caused by the procedure. Coagulation for       hemostasis using bipolar probe was successful. Estimated blood loss was       minimal.      Patchy moderately congested mucosa without active bleeding and with no       stigmata of bleeding was found in the duodenal bulb. Estimated blood       loss: none. Impression:            - Normal esophagus.                        - Gastritis.                        - 2 cm hiatal hernia.                        - Normal second portion of the duodenum and third                         portion of the duodenum.                        - Oozing duodenal ulcer with pigmented material.                         Treatment not successful. Treated with bipolar cautery.                        -  Congested duodenal mucosa.                        - No specimens collected. Recommendation:        - Return patient to ICU for ongoing care.                        - Serial H/H, Serial exams                        - Clear liquid diet.                        - Continue present medications.                        - GI service will continue to follow the patient's                         progress and closely  observe for any changes in                         clinical status. Procedure Code(s):     --- Professional ---                        [phone], Esophagogastroduodenoscopy, flexible,                         transoral; with control of bleeding, any method Diagnosis Code(s):     --- Professional ---                        K92.1, Melena (includes Hematochezia)                        D62, Acute posthemorrhagic anemia                        K31.89, Other diseases of stomach and duodenum                        K26.4, Chronic or unspecified duodenal ulcer with                         hemorrhage                        K44.9, Diaphragmatic hernia without obstruction or                         gangrene                        K29.70, Gastritis, unspecified, without bleeding CPT copyright 2022 American Medical Association. All rights reserved. The codes documented in this report are preliminary and upon coder review may  be revised to meet current compliance requirements. Stanton Kidney MD, MD 01/09/2023 9:49:45 AM This report has been signed electronically. Number of Addenda: 0 Note Initiated On: 01/09/2023 8:45 AM Estimated Blood Loss:  Estimated blood loss was minimal. Estimated blood loss  was minimal.      [facility]

## 2023-01-09 NOTE — Progress Notes (Signed)
[facility]  ROUNDING NOTE   Subjective:   Anthony Hess is a 67 year old male with past medical conditions including hypertension, PVD, dyslipidemia, CAD, four-vessel CABG, and end-stage renal disease on hemodialysis. Patient presents for outpatient angiogram with vascular surgery and was found to have abnormal labs. He is admitted for Hyperkalemia [E87.5] SOB (shortness of breath) [R06.02] ESRD on hemodialysis (HCC) [N18.6, Z99.2] Demand ischemia of myocardium (HCC) [I24.89] Severe anemia [D64.9] Gastrointestinal hemorrhage, unspecified gastrointestinal hemorrhage type [K92.2]  Patient is known to our practice and receives outpatient dialysis at [facility] on a TTS schedule,    Patient presented to the emergency room via EMS for shortness of breath and required oxygen supplementation by facemask.  His labs show hemoglobin down to 4.  Patient also reported dark stools for the past 2 days prior to admission.  He is on Eliquis.  He was also found to have severe hyperkalemia with potassium of 6.3.  He was given shifting measures in the emergency room. He missed his Thursday outpatient dialysis treatment.  Patient received urgent hemodialysis treatment yesterday for hyperkalemia and was transfused 3 units of blood. He underwent EGD this morning which showed normal esophagus, diffuse mild inflammation in the stomach, 1 oozing cratered duodenal ulcer with pigmented material was found in the duodenal bulb.  Hemostasis was achieved with bipolar probe.  Breathing comfortably on room air this morning.   Objective:  Vital signs in last 24 hours:  Temp:  [94.6 F (34.8 C)-100 F (37.8 C)] 99 F (37.2 C) (11/17 1100) Pulse Rate:  [35-128] 49 (11/17 1100) Resp:  [10-29] 21 (11/17 1100) BP: (67-140)/(50-90) 100/80 (11/17 1100) SpO2:  [64 %-100 %] 99 % (11/17 1100) Weight:  [46.6 kg-59 kg] 53.1 kg (11/16 2135)  Weight change:  Filed Weights   01/08/23 1715 01/08/23 1730 01/08/23  2135  Weight: 46.6 kg 46.6 kg 53.1 kg    Intake/Output: I/O last 3 completed shifts: In: 1483.3 [I.V.:38.3; Blood:1445] Out: 500 [Other:500]   Intake/Output this shift:  No intake/output data recorded.  Physical Exam: General: NAD  Head: Normocephalic, atraumatic. Moist oral mucosal membranes  Eyes: Anicteric  Neck: Supple  Lungs:  Normal breathing effort   Heart: Regular rate and rhythm  Abdomen:  Soft, nontender  Extremities:  No peripheral edema.  Neurologic: Alert and oriented, answering questions appropriately  Skin: No lesions,   Access: Rt chest permcath    Basic Metabolic Panel: Recent Labs  Lab 01/06/23 1040 01/08/23 1324 01/08/23 2255 01/09/23 0448  NA 138 137 135 135  K 3.9 6.3* 3.2* 3.7  CL 101 101 96* 95*  CO2 27 18* 24 27  GLUCOSE 92 141* 96 89  BUN 68* 144* 44* 49*  CREATININE 3.86* 8.03* 3.19* 3.81*  CALCIUM 8.0* 7.6* 7.6* 7.4*    Liver Function Tests: Recent Labs  Lab 01/06/23 1040 01/08/23 1324  AST 21 28  ALT 19 17  ALKPHOS 41 45  BILITOT 0.3 0.2  PROT 5.4* 5.2*  ALBUMIN 2.6* 2.5*   Recent Labs  Lab 01/08/23 1324  LIPASE 28   No results for input(s): "AMMONIA" in the last 168 hours.  CBC: Recent Labs  Lab 01/06/23 1040 01/08/23 1324 01/08/23 1426 01/08/23 2255 01/09/23 0448  WBC 6.8 18.4* 17.4* 17.3* 15.7*  NEUTROABS 5.7 15.6* 14.9* 14.7*  --   HGB 12.3* 4.2* 4.0* 7.7* 7.4*  HCT 40.9 14.3* 13.5* 22.2* 21.7*  MCV 93.8 100.0 100.7* 86.7 87.9  PLT 225 505* 519* 311 307  Cardiac Enzymes: No results for input(s): "CKTOTAL", "CKMB", "CKMBINDEX", "TROPONINI" in the last 168 hours.  BNP: Invalid input(s): "POCBNP"  CBG: Recent Labs  Lab 01/08/23 1726  GLUCAP 112*     Microbiology: Results for orders placed or performed during the hospital encounter of 01/08/23  Culture, blood (routine x 2)     Status: None (Preliminary result)   Collection Time: 01/08/23  1:24 PM   Specimen: BLOOD RIGHT ARM  Result Value Ref  Range Status   Specimen Description BLOOD RIGHT ARM  Final   Special Requests   Final    BOTTLES DRAWN AEROBIC AND ANAEROBIC Blood Culture results may not be optimal due to an excessive volume of blood received in culture bottles   Culture   Final    NO GROWTH < 24 HOURS Performed at [facility], [address], [place], [place] [address]    Report Status PENDING  Incomplete  Culture, blood (routine x 2)     Status: None (Preliminary result)   Collection Time: 01/08/23  2:27 PM   Specimen: BLOOD  Result Value Ref Range Status   Specimen Description BLOOD BLOOD LEFT FOREARM  Final   Special Requests   Final    BOTTLES DRAWN AEROBIC AND ANAEROBIC Blood Culture adequate volume   Culture   Final    NO GROWTH < 24 HOURS Performed at [facility], [address] [place], [place] [address]    Report Status PENDING  Incomplete  MRSA Next Gen by PCR, Nasal     Status: None   Collection Time: 01/08/23  5:19 PM   Specimen: Nasal Mucosa; Nasal Swab  Result Value Ref Range Status   MRSA by PCR Next Gen NOT DETECTED NOT DETECTED Final    Comment: (NOTE) The GeneXpert MRSA Assay (FDA approved for NASAL specimens only), is one component of a comprehensive MRSA colonization surveillance program. It is not intended to diagnose MRSA infection nor to guide or monitor treatment for MRSA infections. Test performance is not FDA approved in patients less than 74 years old. Performed at [facility], [address] [place], [place] [address]     Coagulation Studies: Recent Labs    01/08/23 2255  LABPROT 15.7*  INR 1.2     Urinalysis: No results for input(s): "COLORURINE", "LABSPEC", "PHURINE", "GLUCOSEU", "HGBUR", "BILIRUBINUR", "KETONESUR", "PROTEINUR", "UROBILINOGEN", "NITRITE", "LEUKOCYTESUR" in the last 72 hours.  Invalid input(s): "APPERANCEUR"    Imaging: CT Angio Chest PE W/Cm &/Or Wo Cm  Result Date: 01/08/2023 CLINICAL DATA:  Acute shortness of  breath, wheezing, and hypoxia. Previous pulmonary embolism. EXAM: CT ANGIOGRAPHY CHEST WITH CONTRAST TECHNIQUE: Multidetector CT imaging of the chest was performed using the standard protocol during bolus administration of intravenous contrast. Multiplanar CT image reconstructions and MIPs were obtained to evaluate the vascular anatomy. RADIATION DOSE REDUCTION: This exam was performed according to the departmental dose-optimization program which includes automated exposure control, adjustment of the mA and/or kV according to patient size and/or use of iterative reconstruction technique. CONTRAST:  75mL OMNIPAQUE IOHEXOL 350 MG/ML SOLN COMPARISON:  10/14/2022 FINDINGS: Cardiovascular: Satisfactory opacification of pulmonary arteries noted. A single tiny nonocclusive embolus is seen in a subsegmental left lower lobe pulmonary artery branch (image 278/6) which is unchanged since previous study. No acute pulmonary embolism identified. No evidence of thoracic aortic dissection or aneurysm. Mediastinum/Nodes: No masses or pathologically enlarged lymph nodes identified. Lungs/Pleura: Moderate centrilobular emphysema again noted. No pulmonary mass, infiltrate, or effusion. Upper abdomen: No acute findings. Musculoskeletal:  No suspicious bone lesions identified. Review of the MIP images confirms the above findings. IMPRESSION: No evidence of acute pulmonary embolism or other acute findings. 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 Emphysema (ICD10-J43.9). Electronically Signed   By: Danae Orleans M.D.   On: 01/08/2023 17:42   CT ABDOMEN PELVIS W CONTRAST  Result Date: 01/08/2023 CLINICAL DATA:  Acute abdominal pain. EXAM: CT ABDOMEN AND PELVIS WITH CONTRAST TECHNIQUE: Multidetector CT imaging of the abdomen and pelvis was performed using the standard protocol following bolus administration of intravenous contrast. RADIATION DOSE REDUCTION:  This exam was performed according to the departmental dose-optimization program which includes automated exposure control, adjustment of the mA and/or kV according to patient size and/or use of iterative reconstruction technique. CONTRAST:  75mL OMNIPAQUE IOHEXOL 350 MG/ML SOLN COMPARISON:  09/03/2022 FINDINGS: Lower Chest: No acute findings. Hepatobiliary: Several previously demonstrated benign hemangiomas and cysts show no significant change since prior study. Probable tiny gallstones seen, however, there is no No evidence of cholecystitis or biliary ductal dilatation. Pancreas:  No mass or inflammatory changes. Spleen: Within normal limits in size and appearance. Adrenals/Urinary Tract: Severe bilateral diffuse renal parenchymal atrophy again seen, consistent with end-stage renal disease. No suspicious masses identified. No evidence of ureteral calculi or hydronephrosis. Diffuse bladder wall thickening again noted, which may be due to chronic bladder outlet obstruction or cystitis. Stomach/Bowel: No evidence of obstruction, inflammatory process or abnormal fluid collections. Vascular/Lymphatic: No pathologically enlarged lymph nodes. No acute vascular findings. Reproductive:  No mass or other significant abnormality. Other:  None. Musculoskeletal:  No suspicious bone lesions identified. IMPRESSION: No acute findings. Stable diffuse bladder wall thickening, which may be due to chronic bladder outlet obstruction or cystitis. Recommend correlation with urinalysis. Probable tiny gallstones. No radiographic evidence of cholecystitis. Chronic severe bilateral renal atrophy, consistent with end-stage renal disease. No evidence of hydronephrosis. Electronically Signed   By: Danae Orleans M.D.   On: 01/08/2023 17:26     Medications:    sodium chloride     anticoagulant sodium citrate     norepinephrine (LEVOPHED) Adult infusion Stopped (01/08/23 2203)     sodium chloride   Intravenous Once   sodium chloride    Intravenous Once   Chlorhexidine Gluconate Cloth  6 each Topical Q0600   pantoprazole (PROTONIX) IV  40 mg Intravenous Q6H   sodium zirconium cyclosilicate  10 g Oral Once   alteplase, anticoagulant sodium citrate, feeding supplement (NEPRO CARB STEADY), heparin, lidocaine (PF), lidocaine-prilocaine, mouth rinse, pentafluoroprop-tetrafluoroeth  Assessment/ Plan:  Mr. Hugh Creasman is a 67 y.o.  male with past medical conditions including hypertension, PVD, dyslipidemia, CAD, four-vessel CABG, and end-stage renal disease on hemodialysis. Patient presents for outpatient angiogram with vascular surgery and was found to have abnormal labs. He is bring admitted for Hyperkalemia [E87.5] SOB (shortness of breath) [R06.02] ESRD on hemodialysis (HCC) [N18.6, Z99.2] Demand ischemia of myocardium (HCC) [I24.89] Severe anemia [D64.9] Gastrointestinal hemorrhage, unspecified gastrointestinal hemorrhage type [K92.2]   End stage renal disease with hyperkalemia on hemodialysis.  Patient underwent successful hemodialysis on Saturday.  We will maintain TTS schedule.  Next hemodialysis planned for Tuesday.  2. Anemia of chronic kidney disease and acute Blood loss.  Lab Results  Component Value Date   HGB 7.4 (L) 01/09/2023  Hemoglobin was severely low and thought to be secondary to GI bleed.  EGD today showed cratered duodenal ulcer which was treated with bipolar probe.  He received 3 units  of blood transfusion with dialysis on Saturday.  Hemoglobin level acceptable today. Maintain patient on IV Epogen with dialysis TTS schedule.  3. Secondary Hyperparathyroidism:    Lab Results  Component Value Date   PTH 170 (H) 06/25/2022   CALCIUM 7.4 (L) 01/09/2023   CAION 0.82 (LL) 10/13/2022   PHOS 9.4 (H) 11/17/2022    Hypocalcemia noted.  High Phos.  Sevelamer and calcium acetate be restarted once patient is able to have regular meals..   4.  Peripheral vascular disease-patient is status post angioplasty of  right leg with stent placement on 12/23/2022.   LOS: 1 Charlett Merkle 11/17/202411:33 AM

## 2023-01-09 NOTE — TOC Initial Note (Signed)
Transition of Care [facility]) - Initial/Assessment Note    Patient Details  Name: Anthony Hess MRN: [id number] Date of Birth: March 22, 1955  [facility] CM/SW Contact:    Colette Ribas, LCSWA Phone Number: 01/09/2023, 12:18 PM  Clinical Narrative:                  CSW attempted HRA with patient. Not responsive at this time.       Patient Goals and CMS Choice            Expected Discharge Plan and Services                                              Prior Living Arrangements/Services                       Activities of Daily Living      Permission Sought/Granted                  Emotional Assessment              Admission diagnosis:  Hyperkalemia [E87.5] SOB (shortness of breath) [R06.02] ESRD on hemodialysis (HCC) [N18.6, Z99.2] Demand ischemia of myocardium (HCC) [I24.89] Severe anemia [D64.9] Gastrointestinal hemorrhage, unspecified gastrointestinal hemorrhage type [K92.2] Patient Active Problem List   Diagnosis Date Noted   Severe anemia 01/08/2023   Hemorrhagic shock (HCC) 01/08/2023   ABLA (acute blood loss anemia) 01/08/2023   Upper GI bleed 01/08/2023   Demand ischemia of myocardium (HCC) 01/08/2023   Gastrointestinal hemorrhage 01/08/2023   Atherosclerosis of native arteries of extremity with rest pain (HCC) 01/05/2023   Ischemic leg pain 12/31/2022   Limb ischemia 12/21/2022   COPD with acute exacerbation (HCC) 11/16/2022   Paroxysmal atrial fibrillation (HCC) 11/16/2022   Problem with vascular access 10/22/2022   Dyspnea 10/15/2022   GERD without esophagitis 10/15/2022   ESRD on hemodialysis (HCC) 09/17/2022   Acute on chronic diastolic CHF (congestive heart failure) (HCC) 09/17/2022   History of pulmonary embolism 09/17/2022   Coronary artery disease 09/17/2022   Essential hypertension 09/17/2022   Acute respiratory failure with hypoxia (HCC) 09/06/2022   Fluid overload 09/06/2022    Hypertensive emergency 09/06/2022   HLD (hyperlipidemia) 09/06/2022   CAD (coronary artery disease) 09/06/2022   Pulmonary embolism (HCC) 09/06/2022   NSTEMI (non-ST elevated myocardial infarction) (HCC) 08/17/2022   (HFpEF) heart failure with preserved ejection fraction (HCC) 08/17/2022   Seasonal allergic rhinitis due to pollen 06/25/2022   Nausea 06/25/2022   Hoarseness of voice 06/25/2022   S/P exploratory laparotomy for perforated duodenum with admission 04/26/22 05/04/2022   Postprocedural intraabdominal abscess (HCC) 05/04/2022   Acute pulmonary embolism (HCC) 04/19/2022   Dyslipidemia 04/19/2022   End-stage renal disease on hemodialysis (HCC) 04/19/2022   Tobacco dependence 04/19/2022   Chronic systolic heart failure (HCC) 04/05/2022   Hypertensive urgency 11/16/2021   Hyperkalemia 11/16/2021   Chronic back pain 11/16/2021   Hypertension secondary to other renal disorders 10/15/2021   Vitamin B12 deficiency anemia due to intrinsic factor deficiency 06/23/2021   Abdominal pain    SBP (spontaneous bacterial peritonitis) (HCC) 04/08/2021   COVID-19 04/08/2021   AAA (abdominal aortic aneurysm) (HCC) 03/18/2021   Hypocalcemia    Leukocytosis 03/05/2021   Spontaneous bacterial peritonitis (HCC) 03/04/2021   Incisional hernia, without obstruction or gangrene  Generalized (acute) peritonitis (HCC) 12/10/2020   Iron deficiency anemia, unspecified 05/06/2020   Moderate protein-calorie malnutrition (HCC) 05/06/2020   Other long term (current) drug therapy 05/06/2020   Unspecified jaundice 05/06/2020   PD catheter dysfunction (HCC) 04/13/2020   Bilateral carotid artery stenosis 02/29/2020   Coronary artery disease involving coronary bypass graft of native heart 02/29/2020   Secondary hyperparathyroidism of renal origin (HCC) 01/30/2020   Acute peritonitis (HCC) 01/08/2020   Hyperlipidemia, mixed 01/04/2020   Mass of left side of neck 01/04/2020   Senile purpura (HCC) 01/04/2020    Hydroureteronephrosis    Atrial fibrillation, chronic (HCC) 01/05/2019   End stage renal disease (HCC) 01/05/2019   Anemia in chronic kidney disease (CODE) 01/05/2019   Presence of aortocoronary bypass graft 12/27/2018   Other emphysema (HCC) 04/14/2018   Bilateral hydronephrosis 04/03/2018   Hypertension 03/27/2018   Cigarette smoker 03/20/2018   History of bladder cancer 03/20/2018   PCP:  Ronnald Ramp, MD Pharmacy:   [facility] [address]), [place] - [address] [place]) [place] [address] Phone: [phone] Fax: [phone]     Social Determinants of Health (SDOH) Social History: SDOH Screenings   Food Insecurity: No Food Insecurity (12/21/2022)  Recent Concern: Food Insecurity - Food Insecurity Present (11/16/2022)  Housing: Low Risk  (12/21/2022)  Recent Concern: Housing - Medium Risk (11/16/2022)  Transportation Needs: No Transportation Needs (12/21/2022)  Recent Concern: Transportation Needs - Unmet Transportation Needs (11/16/2022)  Utilities: Not At Risk (12/21/2022)  Recent Concern: Utilities - At Risk (11/16/2022)  Depression (PHQ2-9): Medium Risk (12/14/2022)  Financial Resource Strain: Low Risk  (08/23/2022)  Physical Activity: Inactive (08/23/2022)  Social Connections: Moderately Integrated (08/23/2022)  Stress: No Stress Concern Present (08/23/2022)  Tobacco Use: Medium Risk (01/08/2023)   SDOH Interventions:     Readmission Risk Interventions    11/17/2022    9:38 AM 11/16/2022    1:01 PM 09/17/2022   10:26 AM  Readmission Risk Prevention Plan  Transportation Screening Complete Complete Complete  Medication Review Oceanographer) Complete Not Complete Complete  Med Review Comments  will complete with Rn upon discharge   PCP or Specialist appointment within 3-5 days of discharge Complete Not Complete Complete  PCP/Specialist Appt Not Complete comments pcp dr. simmons-robins SNF placement  may be needed   HRI or Home Care Consult  Not Complete Complete  SW Recovery Care/Counseling Consult  Not Complete Complete  SW Consult Not Complete Comments n/a    Palliative Care Screening Not Applicable Not Applicable Not Applicable  Skilled Nursing Facility Not Applicable Not Complete Complete  SNF Comments not applicable Pt waiting PT and OT evaluation

## 2023-01-09 NOTE — Anesthesia Preprocedure Evaluation (Addendum)
Anesthesia Evaluation  Patient identified by MRN, date of birth, ID band Patient awake  General Assessment Comment:  Patient with perforated viscus, free air on CT in abdomen. Potassium = 5.9. S/p calcium gluconate and sodium bicarbonate administration.  Reviewed: Allergy & Precautions, NPO status , Patient's Chart, lab work & pertinent test results, reviewed documented beta blocker date and time   Airway Mallampati: III  TM Distance: >3 FB Neck ROM: full    Dental  (+) Poor Dentition, Missing, Edentulous Upper, Chipped   Pulmonary COPD, Patient abstained from smoking., former smoker, PE On Eliquis for recently diagnosed PE.   Pulmonary exam normal breath sounds clear to auscultation       Cardiovascular Exercise Tolerance: Good hypertension, Pt. on medications and Pt. on home beta blockers + CAD, + Past MI, + CABG, + Peripheral Vascular Disease and +CHF  Normal cardiovascular exam+ dysrhythmias Atrial Fibrillation  Rhythm:Regular Rate:Normal  TTE 03/2022:  1. Left ventricular ejection fraction, by estimation, is 55 to 60%. The  left ventricle has normal function. The left ventricle has no regional  wall motion abnormalities. There is moderate concentric left ventricular  hypertrophy. Left ventricular  diastolic parameters are consistent with Grade I diastolic dysfunction  (impaired relaxation).   2. Right ventricular systolic function is normal. The right ventricular  size is normal. There is normal pulmonary artery systolic pressure.   3. The mitral valve is normal in structure. No evidence of mitral valve  regurgitation.   4. The aortic valve is tricuspid. Aortic valve regurgitation is not  visualized. Aortic valve sclerosis/calcification is present, without any  evidence of aortic stenosis.   5. The inferior vena cava is normal in size with greater than 50%  respiratory variability, suggesting right atrial pressure of 3  mmHg.     Neuro/Psych negative neurological ROS  negative psych ROS   GI/Hepatic negative GI ROS, Neg liver ROS,,,  Endo/Other  negative endocrine ROS    Renal/GU Last dialyzed saturday     Musculoskeletal   Abdominal   Peds  Hematology  (+) Blood dyscrasia, anemia   Anesthesia Other Findings PMH of increasing troponins. Last troponin was over 9000. Seen by cardiology. Per their note 11/17:  Elevated peak troponin to 9432, but no chest pain and no ST elevation on EKG with nonspecific ST-T changes.  Currently patient is not short of breath.  Patient has a prior history of being on Eliquis and had a hemoglobin of 4 but right now after 2 units is 7.4 and is feeling much better.   Most likely the elevated troponin is due to severe hemoglobin drop from 11 hemoglobin to 4.  Since he is feeling much better and EGD is a low risk procedure, advise proceeding today.  At this point would not start the patient back on Ellquis after procedure..  Advise also getting echocardiogram to look for wall motion and ejection fraction.  But no need to delay EGD.    Past Medical History: No date: Aortic atherosclerosis (HCC) No date: Bilateral carotid artery disease (HCC) No date: Bladder cancer (HCC) 12/20/2018: Coronary artery disease     Comment:  a.) LHC 12/20/2018: 50% OM1, 40% OM2, 95% o-pLAD, 75%               o=pLCx, 40% mLM, 70% D1, 60% mRCA-1, 50% mRCA-2; refer to              CVTS. b.) 4v CABG [facility] on 12/27/2018: LIMA-LAD,  RIMA-PDA, seg LRA-OM1-D1 12/05/2018: DCM (dilated cardiomyopathy) (HCC)     Comment:  a.) TTE 12/05/2018: EF 40-45%. b.) TTE 12/28/2019: EF               20-25%. No date: ESRD (end stage renal disease) (HCC)     Comment:  a.) T-Th-Sat 12/05/2018: HFrEF (heart failure with reduced ejection fraction) (HCC)     Comment:  a.) TTE 12/05/2018: EF 40-45%; mild LVH; ant/apical/sep               HK; mild TR . b.) TTE 12/28/2019: EF 20-25%; mod LVH; mod               MR/AR; G1DD. 04/08/2021: History of 2019 novel coronavirus disease (COVID-19) No date: History of kidney stones No date: HLD (hyperlipidemia) No date: Hypertension 03/05/2021: Infrarenal abdominal aortic aneurysm (AAA) without  rupture (HCC)     Comment:  a.) CT abd/pelvis; measured 3.2 cm. No date: Myocardial infarction [facility]) No date: PVD (peripheral vascular disease) (HCC) 12/27/2018: S/P CABG x 4     Comment:  a.) LIMA-LAD, RIMA-PDA, sequential LEFT radial artery to              OM1 and D1 03/14/2021: Sepsis (HCC) No date: Wears glasses  Past Surgical History: 07/30/2021: AV FISTULA PLACEMENT; Left     Comment:  Procedure: INSERTION OF ARTERIOVENOUS (AV) GORE-TEX               GRAFT ARM BRACHIAL ARTERY TO AXILLARY VEIN;  Surgeon:               Annice Needy, MD;  Location: [facility] ORS;  Service:               Vascular;  Laterality: Left; 12/31/2019: CAPD INSERTION; N/A     Comment:  Procedure: LAPAROSCOPIC INSERTION CONTINUOUS AMBULATORY               PERITONEAL DIALYSIS  (CAPD) CATHETER;  Surgeon: Leafy Ro, MD;  Location: [facility] ORS;  Service: General;                Laterality: N/A; 04/10/2020: CAPD REMOVAL; N/A     Comment:  Procedure: LAPAROSCOPIC REVISION OF CONTINUOUS               AMBULATORY PERITONEAL DIALYSIS  (CAPD) CATHETER;                Surgeon: Leafy Ro, MD;  Location: [facility] ORS;                Service: General;  Laterality: N/A; 12/27/2018: CORONARY ARTERY BYPASS GRAFT; N/A     Comment:  Procedure: CORONARY ARTERY BYPASS GRAFTING (CABG) X 4 ON              PUMP USING RIGHT & LEFT INTERNAL MAMMARY ARTERY LEFT               RADIAL ARTERY ENDOSCOPICALLY HARVESTED;  Surgeon: Linden Dolin, MD;  Location: [facility] OR;  Service: Open Heart               Surgery;  Laterality: N/A; 05/15/2019: CYSTOSCOPY W/ RETROGRADES; Bilateral     Comment:  Procedure: CYSTOSCOPY WITH RETROGRADE PYELOGRAM;                Surgeon: Riki Altes,  MD;   Location: [facility] ORS;                Service: Urology;  Laterality: Bilateral; 05/15/2019: CYSTOSCOPY WITH BIOPSY; N/A     Comment:  Procedure: CYSTOSCOPY WITH bladder BIOPSY;  Surgeon:               Riki Altes, MD;  Location: [facility] ORS;  Service:               Urology;  Laterality: N/A; 12/28/2019: DIALYSIS/PERMA CATHETER INSERTION; N/A     Comment:  Procedure: DIALYSIS/PERMA CATHETER INSERTION;  Surgeon:               Annice Needy, MD;  Location: [facility] INVASIVE CV [facility];                Service: Cardiovascular;  Laterality: N/A; 03/18/2021: DIALYSIS/PERMA CATHETER INSERTION; N/A     Comment:  Procedure: DIALYSIS/PERMA CATHETER INSERTION;  Surgeon:               Annice Needy, MD;  Location: [facility];                Service: Cardiovascular;  Laterality: N/A; 06/02/2020: DIALYSIS/PERMA CATHETER REMOVAL; N/A     Comment:  Procedure: DIALYSIS/PERMA CATHETER REMOVAL;  Surgeon:               Annice Needy, MD;  Location: [facility];                Service: Cardiovascular;  Laterality: N/A; 04/10/2020: EXCHANGE OF A DIALYSIS CATHETER; Right     Comment:  Procedure: EXCHANGE OF A DIALYSIS CATHETER;  Surgeon:               Leafy Ro, MD;  Location: [facility] OR[facility];  Service:               General;  Laterality: Right; 01/20/2021: INCISIONAL HERNIA REPAIR     Comment:  Procedure: HERNIA REPAIR INCISIONAL;  Surgeon: Henrene Dodge, MD;  Location: [facility] ORS;  Service: General;; 04/07/2020: IR IMAGE GUIDED DRAINAGE PERCUT CATH  PERITONEAL RETROPERIT 04/16/2021: LAPAROSCOPY; N/A     Comment:  Procedure: LAPAROSCOPY DIAGNOSTIC;  Surgeon: Sung Amabile, DO;  Location: [facility] OR[facility];  Service: General;                Laterality: N/A; 12/20/2018: LEFT HEART CATH AND CORONARY ANGIOGRAPHY; Left     Comment:  Procedure: LEFT HEART CATH AND CORONARY ANGIOGRAPHY;                Surgeon: Marcina Millard, MD;  Location: [facility]               INVASIVE CV LAB;  Service:  Cardiovascular;  Laterality:               Left; 12/27/2018: RADIAL ARTERY HARVEST; Left     Comment:  Procedure: ENDOSCOPIC RADIAL ARTERY HARVEST;  Surgeon:               Linden Dolin, MD;  Location: [facility] OR;  Service: Open               Heart Surgery;  Laterality: Left; 03/20/2021: REMOVAL OF A DIALYSIS CATHETER; Left     Comment:  Procedure: REMOVAL OF A PD CATHETER;  Surgeon: Annice Needy, MD;  Location: [facility] ORS;  Service: Vascular;                Laterality: Left; 12/27/2018: TEE WITHOUT CARDIOVERSION; N/A     Comment:  Procedure: TRANSESOPHAGEAL ECHOCARDIOGRAM (TEE);                Surgeon: Linden Dolin, MD;  Location: [facility] OR;                Service: Open Heart Surgery;  Laterality: N/A; 2019: TUMOR REMOVAL     Comment:  Bladder  BMI    Body Mass Index: 22.50 kg/m      Reproductive/Obstetrics negative OB ROS                             Anesthesia Physical Anesthesia Plan  ASA: 4  Anesthesia Plan: General   Post-op Pain Management: Minimal or no pain anticipated   Induction: Intravenous  PONV Risk Score and Plan: 3 and Propofol infusion, TIVA and Ondansetron  Airway Management Planned: Nasal Cannula  Additional Equipment: None  Intra-op Plan:   Post-operative Plan:   Informed Consent: I have reviewed the patients History and Physical, chart, labs and discussed the procedure including the risks, benefits and alternatives for the proposed anesthesia with the patient or authorized representative who has indicated his/her understanding and acceptance.     Dental advisory given  Plan Discussed with: CRNA and Surgeon  Anesthesia Plan Comments: (Discussed risks of anesthesia with patient, including possibility of difficulty with spontaneous ventilation under anesthesia necessitating airway intervention, PONV, and rare risks such as cardiac or respiratory or neurological events, and allergic reactions. Discussed the role of  CRNA in patient's perioperative care. Patient understands.)       Anesthesia Quick Evaluation

## 2023-01-09 NOTE — Progress Notes (Signed)
Progress Note   Patient: Anthony Hess ZDG:[id number] DOB: 1955-11-12 DOA: 01/08/2023     1 DOS: the patient was seen and examined on 01/09/2023   Brief hospital course: Anthony Hess is a 67 y.o. male with medical history significant of history of hypertension, atrial fibrillation on Eliquis, ESRD on hemodialysis TTS through right tunneled IJ catheter, COPD, peripheral vascular disease Recent discharge for limb ischemia status post angiogram with angioplasty and stent placement 12/23/2022 presented to the ED complaining of shortness of breath.  Patient has been having worsening shortness of breath, weak, dizzy since last 2 days.  Missed hemodialysis Saturday due to severe shortness of breath.  Patient noted  dark stools and has been not taking Eliquis for 2 days.  Patient was last seen in the emergency department 2 days ago for similar complaints.  In the emergency department patient was noted to have very low hemoglobin of 4 while last hemoglobin greater than 12, 2 days ago.  Patient's troponin greater than 3000, white count elevated, potassium 6.3.  His blood pressure noted to be very low, started on pressors and admitted to ICU with urgent hemodialysis set up by nephrology.  01/09/2023-patient is seen by GI after cardiology clearance.  He had endoscopy procedure which showed gastritis, 2 cm hiatal hernia, oozing duodenal ulcer, cauterized.   Assessment and Plan: Symptomatic anemia Acute blood loss anemia. Hemorrhagic shock Upper GI bleed-due to duodenal bleeding ulcer. Transfer to SDU for close monitoring of vitals. S/p total 3 units of blood transfusion with close monitoring of H&H.  Continue to hold Eliquis or heparin for prophylaxis. Hold home antihypertensive medications. PCCM signed off appreciated. BP improved no he is off pressors. S/p GI evaluation, status post EGD which showed gastritis, bleeding duodenal ulcer which is cauterized.  Advised to monitor H&H, clear liquid diet Monitor  H&H and transfuse if hemoglobin less than 8.   Elevated troponin- Likely demand ischemia from severe anemia Follow echocardiogram results. Cardiology and elevation appreciated cleared him for GI procedure.   Lactic acidosis:  Due to hemorrhagic shock. Trend lactic acid.   ESRD on HD Hyperkalemia- Got urgent dialysis yesterday. Nephrology follow-up appreciated. Hemodialysis as per schedule. Potassium improved.Marland Kitchen   COPD Duonebs as needed. Supplemental o2. Not in exacerbation.   Atrial fibrillation: Hold home beta blocker given borderline low BP. Continue telemetry. Hold Eliquis given significant GI bleed.  Peripheral vascular disease: Recent discharge for limb ischemia status post angiogram with angioplasty and stent placement 12/23/2022. Currently aspirin, Eliquis on hold due to significant GI bleed.      Out of bed to chair. Incentive spirometry. Nursing supportive care. Fall, aspiration precautions. DVT prophylaxis   Code Status: Full Code  Subjective: Patient is seen and examined today after GI procedure.  He feels much better.  Denies any abdominal pain, nausea or vomiting.  No bloody bowel movements.  He tolerated hemodialysis yesterday.  Off pressors now.  His hemoglobin is 8.3 status post 3 units of blood transfusion.    Physical Exam: Vitals:   01/09/23 1003 01/09/23 1100 01/09/23 1200 01/09/23 1300  BP: 112/72 100/80 101/67   Pulse: 73 (!) 49 80 72  Resp: 15 (!) 21 16 14   Temp:  99 F (37.2 C) 99 F (37.2 C) 99.3 F (37.4 C)  TempSrc:      SpO2: 100% 99% 96% 96%  Weight:      Height:        General - Elderly African-American male, in moderate respiratory distress HEENT - PERRLA, EOMI,  atraumatic head, pale conjunctiva Lung - Clear, diffuse rales, rhonchi, no wheezes. Heart - S1, S2 heard, no murmurs, rubs, trace pedal edema. Abdomen  -soft, non tender, non distended. Neuro - Alert, awake and oriented x 3, non focal exam. Skin -warm and  dry.  Data Reviewed:      Latest Ref Rng & Units 01/09/2023   11:41 AM 01/09/2023    4:48 AM 01/08/2023   10:55 PM  CBC  WBC 4.0 - 10.5 K/uL  15.7  17.3   Hemoglobin 13.0 - 17.0 g/dL 8.3  7.4  7.7   Hematocrit 39.0 - 52.0 % 24.6  21.7  22.2   Platelets 150 - 400 K/uL  307  311       Latest Ref Rng & Units 01/09/2023    4:48 AM 01/08/2023   10:55 PM 01/08/2023    1:24 PM  BMP  Glucose 70 - 99 mg/dL 89  96  161   BUN 8 - 23 mg/dL 49  44  096   Creatinine 0.61 - 1.24 mg/dL 0.45  4.09  8.11   Sodium 135 - 145 mmol/L 135  135  137   Potassium 3.5 - 5.1 mmol/L 3.7  3.2  6.3   Chloride 98 - 111 mmol/L 95  96  101   CO2 22 - 32 mmol/L 27  24  18    Calcium 8.9 - 10.3 mg/dL 7.4  7.6  7.6    CT Angio Chest PE W/Cm &/Or Wo Cm  Result Date: 01/08/2023 CLINICAL DATA:  Acute shortness of breath, wheezing, and hypoxia. Previous pulmonary embolism. EXAM: CT ANGIOGRAPHY CHEST WITH CONTRAST TECHNIQUE: Multidetector CT imaging of the chest was performed using the standard protocol during bolus administration of intravenous contrast. Multiplanar CT image reconstructions and MIPs were obtained to evaluate the vascular anatomy. RADIATION DOSE REDUCTION: This exam was performed according to the departmental dose-optimization program which includes automated exposure control, adjustment of the mA and/or kV according to patient size and/or use of iterative reconstruction technique. CONTRAST:  75mL OMNIPAQUE IOHEXOL 350 MG/ML SOLN COMPARISON:  10/14/2022 FINDINGS: Cardiovascular: Satisfactory opacification of pulmonary arteries noted. A single tiny nonocclusive embolus is seen in a subsegmental left lower lobe pulmonary artery branch (image 278/6) which is unchanged since previous study. No acute pulmonary embolism identified. No evidence of thoracic aortic dissection or aneurysm. Mediastinum/Nodes: No masses or pathologically enlarged lymph nodes identified. Lungs/Pleura: Moderate centrilobular emphysema  again noted. No pulmonary mass, infiltrate, or effusion. Upper abdomen: No acute findings. Musculoskeletal: No suspicious bone lesions identified. Review of the MIP images confirms the above findings. IMPRESSION: No evidence of acute pulmonary embolism or other acute findings. 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 Emphysema (ICD10-J43.9). Electronically Signed   By: Danae Orleans M.D.   On: 01/08/2023 17:42   CT ABDOMEN PELVIS W CONTRAST  Result Date: 01/08/2023 CLINICAL DATA:  Acute abdominal pain. EXAM: CT ABDOMEN AND PELVIS WITH CONTRAST TECHNIQUE: Multidetector CT imaging of the abdomen and pelvis was performed using the standard protocol following bolus administration of intravenous contrast. RADIATION DOSE REDUCTION: This exam was performed according to the departmental dose-optimization program which includes automated exposure control, adjustment of the mA and/or kV according to patient size and/or use of iterative reconstruction technique. CONTRAST:  75mL OMNIPAQUE IOHEXOL 350 MG/ML SOLN COMPARISON:  09/03/2022 FINDINGS: Lower Chest: No acute findings. Hepatobiliary: Several previously demonstrated benign hemangiomas and cysts show no significant change since prior study. Probable  tiny gallstones seen, however, there is no No evidence of cholecystitis or biliary ductal dilatation. Pancreas:  No mass or inflammatory changes. Spleen: Within normal limits in size and appearance. Adrenals/Urinary Tract: Severe bilateral diffuse renal parenchymal atrophy again seen, consistent with end-stage renal disease. No suspicious masses identified. No evidence of ureteral calculi or hydronephrosis. Diffuse bladder wall thickening again noted, which may be due to chronic bladder outlet obstruction or cystitis. Stomach/Bowel: No evidence of obstruction, inflammatory process or abnormal fluid collections. Vascular/Lymphatic: No  pathologically enlarged lymph nodes. No acute vascular findings. Reproductive:  No mass or other significant abnormality. Other:  None. Musculoskeletal:  No suspicious bone lesions identified. IMPRESSION: No acute findings. Stable diffuse bladder wall thickening, which may be due to chronic bladder outlet obstruction or cystitis. Recommend correlation with urinalysis. Probable tiny gallstones. No radiographic evidence of cholecystitis. Chronic severe bilateral renal atrophy, consistent with end-stage renal disease. No evidence of hydronephrosis. Electronically Signed   By: Danae Orleans M.D.   On: 01/08/2023 17:26     Family Communication: Discussed with patient, wife over phone. They understand and agree. All questions answereed.    Disposition: Status is: Inpatient Remains inpatient appropriate because: GI bleed, monitor BP, Hb  Planned Discharge Destination: Home with Home Health     MDM level 3- Patient admitted with GI bleed required pressors, 3 units of blood, ICU admission, urgent HD. He has GI procedure and is critically ill at risk for clinical deterioration.  Author: Marcelino Duster, MD 01/09/2023 2:25 PM Secure chat 7am to 7pm For on call review [email or web address].

## 2023-01-09 NOTE — Plan of Care (Signed)

## 2023-01-09 NOTE — Interval H&P Note (Signed)
History and Physical Interval Note:  01/09/2023 9:12 AM  Anthony Hess  has presented today for surgery, with the diagnosis of melena, hemorrhagic shock, anemia.  The various methods of treatment have been discussed with the patient and family. After consideration of risks, benefits and other options for treatment, the patient has consented to  Procedure(s): ESOPHAGOGASTRODUODENOSCOPY (EGD) (N/A) as a surgical intervention.  The patient's history has been reviewed, patient examined, no change in status, stable for surgery.  I have reviewed the patient's chart and labs.  Questions were answered to the patient's satisfaction.     [place], [place]

## 2023-01-10 ENCOUNTER — Inpatient Hospital Stay
Admit: 2023-01-10 | Discharge: 2023-01-10 | Disposition: A | Discharge status: Home / Self Care | Payer: Medicare Other | Attending: Student

## 2023-01-10 ENCOUNTER — Encounter: Payer: Self-pay | Admitting: Internal Medicine

## 2023-01-10 DIAGNOSIS — N186 End stage renal disease: Secondary | ICD-10-CM | POA: Diagnosis not present

## 2023-01-10 DIAGNOSIS — I2489 Other forms of acute ischemic heart disease: Secondary | ICD-10-CM | POA: Diagnosis not present

## 2023-01-10 DIAGNOSIS — D649 Anemia, unspecified: Secondary | ICD-10-CM | POA: Diagnosis not present

## 2023-01-10 DIAGNOSIS — K922 Gastrointestinal hemorrhage, unspecified: Secondary | ICD-10-CM | POA: Diagnosis not present

## 2023-01-10 LAB — BPAM RBC
Blood Product Expiration Date: 202412132359
Blood Product Expiration Date: 202412152359
Blood Product Expiration Date: 202412152359
Blood Product Expiration Date: 202412142359
ISSUE DATE / TIME: 202411161631
ISSUE DATE / TIME: 202411162019
ISSUE DATE / TIME: 202411170840
ISSUE DATE / TIME: 202411161628
Unit Type and Rh: 6200
Unit Type and Rh: 6200
Unit Type and Rh: 6200
Unit Type and Rh: 6200

## 2023-01-10 LAB — BASIC METABOLIC PANEL
Anion gap: 13 (ref 5–15)
BUN: 69 mg/dL — ABNORMAL HIGH (ref 8–23)
CO2: 26 mmol/L (ref 22–32)
Calcium: 6.8 mg/dL — ABNORMAL LOW (ref 8.9–10.3)
Chloride: 100 mmol/L (ref 98–111)
Creatinine, Ser: 5.84 mg/dL — ABNORMAL HIGH (ref 0.61–1.24)
GFR, Estimated: 10 mL/min — ABNORMAL LOW (ref >60)
Glucose, Bld: 84 mg/dL (ref 70–99)
Potassium: 4.5 mmol/L (ref 3.5–5.1)
Sodium: 139 mmol/L (ref 135–145)

## 2023-01-10 LAB — TYPE AND SCREEN
ABO/RH(D): A POS
Antibody Screen: NEGATIVE
Unit division: 0
Unit division: 0
Unit division: 0
Unit division: 0

## 2023-01-10 LAB — ECHOCARDIOGRAM COMPLETE
AR max vel: 2.12 cm2
AV Area VTI: 2.21 cm2
AV Area mean vel: 2.03 cm2
AV Mean grad: 9 mm[Hg]
AV Peak grad: 15.4 mm[Hg]
Ao pk vel: 1.96 m/s
Area-P 1/2: 2.62 cm2
Calc EF: 47.6 %
Height: 62 in
MV VTI: 2.56 cm2
S' Lateral: 3.4 cm
Single Plane A2C EF: 43.8 %
Single Plane A4C EF: 52.2 %
Weight: 1873.03 [oz_av]

## 2023-01-10 LAB — CBC WITH DIFFERENTIAL/PLATELET
Abs Immature Granulocytes: 0.08 10*3/uL — ABNORMAL HIGH (ref 0.00–0.07)
Basophils Absolute: 0 10*3/uL (ref 0.0–0.1)
Basophils Relative: 0 %
Eosinophils Absolute: 0.1 10*3/uL (ref 0.0–0.5)
Eosinophils Relative: 1 %
HCT: 24.4 % — ABNORMAL LOW (ref 39.0–52.0)
Hemoglobin: 8.4 g/dL — ABNORMAL LOW (ref 13.0–17.0)
Immature Granulocytes: 1 %
Lymphocytes Relative: 11 %
Lymphs Abs: 1.2 10*3/uL (ref 0.7–4.0)
MCH: 30 pg (ref 26.0–34.0)
MCHC: 34.4 g/dL (ref 30.0–36.0)
MCV: 87.1 fL (ref 80.0–100.0)
Monocytes Absolute: 1.5 10*3/uL — ABNORMAL HIGH (ref 0.1–1.0)
Monocytes Relative: 13 %
Neutro Abs: 8.2 10*3/uL — ABNORMAL HIGH (ref 1.7–7.7)
Neutrophils Relative %: 74 %
Platelets: 314 10*3/uL (ref 150–400)
RBC: 2.8 MIL/uL — ABNORMAL LOW (ref 4.22–5.81)
RDW: 18.2 % — ABNORMAL HIGH (ref 11.5–15.5)
WBC: 11.2 10*3/uL — ABNORMAL HIGH (ref 4.0–10.5)
nRBC: 0.3 % — ABNORMAL HIGH (ref 0.0–0.2)

## 2023-01-10 LAB — TROPONIN I (HIGH SENSITIVITY)
Troponin I (High Sensitivity): 16108 ng/L (ref <18)
Troponin I (High Sensitivity): 16416 ng/L (ref <18)

## 2023-01-10 MED ORDER — ATORVASTATIN CALCIUM 20 MG PO TABS
20.0000 mg | ORAL_TABLET | Freq: Every day | ORAL | Status: DC
  Administered 2023-01-10 – 2023-01-12 (×3): 20 mg via ORAL
  Filled 2023-01-10 (×3): qty 1

## 2023-01-10 MED ORDER — ASPIRIN 81 MG PO TBEC
81.0000 mg | DELAYED_RELEASE_TABLET | Freq: Every day | ORAL | Status: DC
  Administered 2023-01-10 – 2023-01-11 (×2): 81 mg via ORAL
  Filled 2023-01-10 (×2): qty 1

## 2023-01-10 NOTE — Progress Notes (Signed)
[facility] Gastroenterology Inpatient Progress Note    Subjective: Patient seen for f/u duodenal ulcer bleed. Awaiting left heart cath later this week. Denies pain or further bleeding.  Objective: Vital signs in last 24 hours: Temp:  [99 F (37.2 C)-100.2 F (37.9 C)] 99.1 F (37.3 C) (11/18 0932) Pulse Rate:  [32-80] 42 (11/18 0932) Resp:  [12-24] 24 (11/18 0932) BP: (76-147)/(56-120) 137/120 (11/18 0932) SpO2:  [92 %-98 %] 96 % (11/18 0932) Blood pressure (!) 137/120, pulse (!) 42, temperature 99.1 F (37.3 C), resp. rate (!) 24, height 5\' 2"  (1.575 m), weight 53.1 kg, SpO2 96%.    Intake/Output from previous day: 11/17 0701 - 11/18 0700 In: 154 [Blood:154] Out: -   Intake/Output this shift: Total I/O In: 1.7 [I.V.:1.7] Out: -    Gen: NAD. Appears comfortable.  HEENT: [place]/AT. PERRLA. Normal external ear exam.  Chest: CTA, no wheezes.  CV: RR nl S1, S2. No gallops.  Abd: soft, nt, nd. BS+  Ext: no edema. Pulses 2+  Neuro: Alert and oriented. Judgement appears normal. Nonfocal.   Lab Results: Results for orders placed or performed during the hospital encounter of 01/08/23 (from the past 24 hour(s))  Hemoglobin and hematocrit, blood     Status: Abnormal   Collection Time: 01/09/23 11:43 PM  Result Value Ref Range   Hemoglobin 8.6 (L) 13.0 - 17.0 g/dL   HCT 47.8 (L) 29.5 - 62.1 %  CBC with Differential/Platelet     Status: Abnormal   Collection Time: 01/10/23  4:00 AM  Result Value Ref Range   WBC 11.2 (H) 4.0 - 10.5 K/uL   RBC 2.80 (L) 4.22 - 5.81 MIL/uL   Hemoglobin 8.4 (L) 13.0 - 17.0 g/dL   HCT 30.8 (L) 65.7 - 84.6 %   MCV 87.1 80.0 - 100.0 fL   MCH 30.0 26.0 - 34.0 pg   MCHC 34.4 30.0 - 36.0 g/dL   RDW 96.2 (H) 95.2 - 84.1 %   Platelets 314 150 - 400 K/uL   nRBC 0.3 (H) 0.0 - 0.2 %   Neutrophils Relative % 74 %   Neutro Abs 8.2 (H) 1.7 - 7.7 K/uL   Lymphocytes Relative 11 %   Lymphs Abs 1.2 0.7 - 4.0 K/uL   Monocytes Relative 13 %    Monocytes Absolute 1.5 (H) 0.1 - 1.0 K/uL   Eosinophils Relative 1 %   Eosinophils Absolute 0.1 0.0 - 0.5 K/uL   Basophils Relative 0 %   Basophils Absolute 0.0 0.0 - 0.1 K/uL   Immature Granulocytes 1 %   Abs Immature Granulocytes 0.08 (H) 0.00 - 0.07 K/uL  Basic metabolic panel     Status: Abnormal   Collection Time: 01/10/23  4:00 AM  Result Value Ref Range   Sodium 139 135 - 145 mmol/L   Potassium 4.5 3.5 - 5.1 mmol/L   Chloride 100 98 - 111 mmol/L   CO2 26 22 - 32 mmol/L   Glucose, Bld 84 70 - 99 mg/dL   BUN 69 (H) 8 - 23 mg/dL   Creatinine, Ser 3.24 (H) 0.61 - 1.24 mg/dL   Calcium 6.8 (L) 8.9 - 10.3 mg/dL   GFR, Estimated 10 (L) >60 mL/min   Anion gap 13 5 - 15  Troponin I (High Sensitivity)     Status: Abnormal   Collection Time: 01/10/23  4:00 AM  Result Value Ref Range   Troponin I (High Sensitivity) 16,108 (HH) <18 ng/L  Troponin I (High Sensitivity)  Status: Abnormal   Collection Time: 01/10/23  6:53 AM  Result Value Ref Range   Troponin I (High Sensitivity) 16,416 (HH) <18 ng/L     Recent Labs    01/08/23 2255 01/09/23 0448 01/09/23 1141 01/09/23 2343 01/10/23 0400  WBC 17.3* 15.7*  --   --  11.2*  HGB 7.7* 7.4* 8.3* 8.6* 8.4*  HCT 22.2* 21.7* 24.6* 25.7* 24.4*  PLT 311 307  --   --  314   BMET Recent Labs    01/08/23 2255 01/09/23 0448 01/10/23 0400  NA 135 135 139  K 3.2* 3.7 4.5  CL 96* 95* 100  CO2 24 27 26   GLUCOSE 96 89 84  BUN 44* 49* 69*  CREATININE 3.19* 3.81* 5.84*  CALCIUM 7.6* 7.4* 6.8*   LFT Recent Labs    01/08/23 1324  PROT 5.2*  ALBUMIN 2.5*  AST 28  ALT 17  ALKPHOS 45  BILITOT 0.2   PT/INR Recent Labs    01/08/23 2255  LABPROT 15.7*  INR 1.2   Hepatitis Panel Recent Labs    01/08/23 1740  HEPBSAG NON REACTIVE   C-Diff No results for input(s): "CDIFFTOX" in the last 72 hours. No results for input(s): "CDIFFPCR" in the last 72 hours.   Studies/Results: ECHOCARDIOGRAM COMPLETE  Result Date:  01/10/2023    ECHOCARDIOGRAM REPORT   Patient Name:   Anthony Hess Date of Exam: 01/10/2023 Medical Rec #:  [id number]    Height:       62.0 in Accession #:    [id number]   Weight:       117.1 lb Date of Birth:  1955/10/15   BSA:          1.523 m Patient Age:    66 years     BP:           141/94 mmHg Patient Gender: M            HR:           68 bpm. Exam Location:  [facility] Procedure: 2D Echo, Cardiac Doppler and Color Doppler Indications:     NSTEMI  History:         Patient has prior history of Echocardiogram examinations, most                  recent 09/15/2022. CHF, Acute MI, CAD and Previous Myocardial                  Infarction, Prior CABG, COPD, Arrythmias:Atrial Fibrillation,                  Signs/Symptoms:Dyspnea; Risk Factors:Hypertension, Dyslipidemia                  and Current Smoker. AAA, Pulmonary embolus, ESRD on dialysis.  Sonographer:     Mikki Harbor Referring Phys:  [id number] CARALYN HUDSON Diagnosing Phys: Marcina Millard MD IMPRESSIONS  1. Left ventricular ejection fraction, by estimation, is 50 to 55%. The left ventricle has low normal function. The left ventricle has no regional wall motion abnormalities. There is moderate left ventricular hypertrophy. Left ventricular diastolic parameters were normal.  2. Right ventricular systolic function is normal. The right ventricular size is normal. There is normal pulmonary artery systolic pressure.  3. The mitral valve is normal in structure. Mild mitral valve regurgitation. No evidence of mitral stenosis.  4. The aortic valve is normal in structure. Aortic valve regurgitation is not visualized. Mild aortic valve stenosis.  5. The  inferior vena cava is normal in size with greater than 50% respiratory variability, suggesting right atrial pressure of 3 mmHg. FINDINGS  Left Ventricle: Left ventricular ejection fraction, by estimation, is 50 to 55%. The left ventricle has low normal function. The left ventricle has no regional wall motion  abnormalities. The left ventricular internal cavity size was normal in size. There is moderate left ventricular hypertrophy. Left ventricular diastolic parameters were normal. Right Ventricle: The right ventricular size is normal. No increase in right ventricular wall thickness. Right ventricular systolic function is normal. There is normal pulmonary artery systolic pressure. The tricuspid regurgitant velocity is 2.42 m/s, and  with an assumed right atrial pressure of 3 mmHg, the estimated right ventricular systolic pressure is 26.4 mmHg. Left Atrium: Left atrial size was normal in size. Right Atrium: Right atrial size was normal in size. Pericardium: There is no evidence of pericardial effusion. Mitral Valve: The mitral valve is normal in structure. Mild mitral valve regurgitation. No evidence of mitral valve stenosis. MV peak gradient, 3.3 mmHg. The mean mitral valve gradient is 1.0 mmHg. Tricuspid Valve: The tricuspid valve is normal in structure. Tricuspid valve regurgitation is mild . No evidence of tricuspid stenosis. Aortic Valve: The aortic valve is normal in structure. Aortic valve regurgitation is not visualized. Mild aortic stenosis is present. Aortic valve mean gradient measures 9.0 mmHg. Aortic valve peak gradient measures 15.4 mmHg. Aortic valve area, by VTI measures 2.21 cm. Pulmonic Valve: The pulmonic valve was normal in structure. Pulmonic valve regurgitation is not visualized. No evidence of pulmonic stenosis. Aorta: The aortic root is normal in size and structure. Venous: The inferior vena cava is normal in size with greater than 50% respiratory variability, suggesting right atrial pressure of 3 mmHg. IAS/Shunts: No atrial level shunt detected by color flow Doppler.  LEFT VENTRICLE PLAX 2D LVIDd:         4.60 cm      Diastology LVIDs:         3.40 cm      LV e' medial:    6.31 cm/s LV PW:         1.40 cm      LV E/e' medial:  15.4 LV IVS:        1.40 cm      LV e' lateral:   14.00 cm/s LVOT  diam:     2.00 cm      LV E/e' lateral: 7.0 LV SV:         86 LV SV Index:   57 LVOT Area:     3.14 cm  LV Volumes (MOD) LV vol d, MOD A2C: 93.8 ml LV vol d, MOD A4C: 107.0 ml LV vol s, MOD A2C: 52.7 ml LV vol s, MOD A4C: 51.1 ml LV SV MOD A2C:     41.1 ml LV SV MOD A4C:     107.0 ml LV SV MOD BP:      47.6 ml RIGHT VENTRICLE RV Basal diam:  3.90 cm RV Mid diam:    4.40 cm RV S prime:     9.68 cm/s LEFT ATRIUM             Index        RIGHT ATRIUM           Index LA diam:        4.40 cm 2.89 cm/m   RA Area:     17.90 cm LA Vol (A2C):   79.6 ml 52.28 ml/m  RA Volume:   45.10 ml  29.62 ml/m LA Vol (A4C):   57.5 ml 37.77 ml/m LA Biplane Vol: 67.7 ml 44.47 ml/m  AORTIC VALVE                     PULMONIC VALVE AV Area (Vmax):    2.12 cm      PV Vmax:       1.36 m/s AV Area (Vmean):   2.03 cm      PV Peak grad:  7.4 mmHg AV Area (VTI):     2.21 cm AV Vmax:           196.00 cm/s AV Vmean:          143.000 cm/s AV VTI:            0.389 m AV Peak Grad:      15.4 mmHg AV Mean Grad:      9.0 mmHg LVOT Vmax:         132.00 cm/s LVOT Vmean:        92.400 cm/s LVOT VTI:          0.274 m LVOT/AV VTI ratio: 0.70  AORTA Ao Root diam: 3.50 cm MITRAL VALVE               TRICUSPID VALVE MV Area (PHT): 2.62 cm    TR Peak grad:   23.4 mmHg MV Area VTI:   2.56 cm    TR Vmax:        242.00 cm/s MV Peak grad:  3.3 mmHg MV Mean grad:  1.0 mmHg    SHUNTS MV Vmax:       0.91 m/s    Systemic VTI:  0.27 m MV Vmean:      46.9 cm/s   Systemic Diam: 2.00 cm MV Decel Time: 289 msec MV E velocity: 97.30 cm/s MV A velocity: 87.80 cm/s MV E/A ratio:  1.11 Marcina Millard MD Electronically signed by Marcina Millard MD Signature Date/Time: 01/10/2023/1:43:11 PM    Final    CT Angio Chest PE W/Cm &/Or Wo Cm  Result Date: 01/08/2023 CLINICAL DATA:  Acute shortness of breath, wheezing, and hypoxia. Previous pulmonary embolism. EXAM: CT ANGIOGRAPHY CHEST WITH CONTRAST TECHNIQUE: Multidetector CT imaging of the chest was performed  using the standard protocol during bolus administration of intravenous contrast. Multiplanar CT image reconstructions and MIPs were obtained to evaluate the vascular anatomy. RADIATION DOSE REDUCTION: This exam was performed according to the departmental dose-optimization program which includes automated exposure control, adjustment of the mA and/or kV according to patient size and/or use of iterative reconstruction technique. CONTRAST:  75mL OMNIPAQUE IOHEXOL 350 MG/ML SOLN COMPARISON:  10/14/2022 FINDINGS: Cardiovascular: Satisfactory opacification of pulmonary arteries noted. A single tiny nonocclusive embolus is seen in a subsegmental left lower lobe pulmonary artery branch (image 278/6) which is unchanged since previous study. No acute pulmonary embolism identified. No evidence of thoracic aortic dissection or aneurysm. Mediastinum/Nodes: No masses or pathologically enlarged lymph nodes identified. Lungs/Pleura: Moderate centrilobular emphysema again noted. No pulmonary mass, infiltrate, or effusion. Upper abdomen: No acute findings. Musculoskeletal: No suspicious bone lesions identified. Review of the MIP images confirms the above findings. IMPRESSION: No evidence of acute pulmonary embolism or other acute findings. 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 Emphysema (ICD10-J43.9). Electronically Signed   By: Danae Orleans M.D.   On: 01/08/2023 17:42   CT ABDOMEN PELVIS W CONTRAST  Result Date:  01/08/2023 CLINICAL DATA:  Acute abdominal pain. EXAM: CT ABDOMEN AND PELVIS WITH CONTRAST TECHNIQUE: Multidetector CT imaging of the abdomen and pelvis was performed using the standard protocol following bolus administration of intravenous contrast. RADIATION DOSE REDUCTION: This exam was performed according to the departmental dose-optimization program which includes automated exposure control, adjustment of the mA and/or kV according  to patient size and/or use of iterative reconstruction technique. CONTRAST:  75mL OMNIPAQUE IOHEXOL 350 MG/ML SOLN COMPARISON:  09/03/2022 FINDINGS: Lower Chest: No acute findings. Hepatobiliary: Several previously demonstrated benign hemangiomas and cysts show no significant change since prior study. Probable tiny gallstones seen, however, there is no No evidence of cholecystitis or biliary ductal dilatation. Pancreas:  No mass or inflammatory changes. Spleen: Within normal limits in size and appearance. Adrenals/Urinary Tract: Severe bilateral diffuse renal parenchymal atrophy again seen, consistent with end-stage renal disease. No suspicious masses identified. No evidence of ureteral calculi or hydronephrosis. Diffuse bladder wall thickening again noted, which may be due to chronic bladder outlet obstruction or cystitis. Stomach/Bowel: No evidence of obstruction, inflammatory process or abnormal fluid collections. Vascular/Lymphatic: No pathologically enlarged lymph nodes. No acute vascular findings. Reproductive:  No mass or other significant abnormality. Other:  None. Musculoskeletal:  No suspicious bone lesions identified. IMPRESSION: No acute findings. Stable diffuse bladder wall thickening, which may be due to chronic bladder outlet obstruction or cystitis. Recommend correlation with urinalysis. Probable tiny gallstones. No radiographic evidence of cholecystitis. Chronic severe bilateral renal atrophy, consistent with end-stage renal disease. No evidence of hydronephrosis. Electronically Signed   By: Danae Orleans M.D.   On: 01/08/2023 17:26    Scheduled Inpatient Medications:    aspirin EC  81 mg Oral Daily   atorvastatin  20 mg Oral Daily   Chlorhexidine Gluconate Cloth  6 each Topical Q0600   [START ON 01/11/2023] epoetin (EPOGEN/PROCRIT) injection  4,000 Units Intravenous Q T,Th,Sa-HD   pantoprazole (PROTONIX) IV  40 mg Intravenous Q6H    Continuous Inpatient Infusions:    anticoagulant  sodium citrate      PRN Inpatient Medications:  alteplase, anticoagulant sodium citrate, feeding supplement (NEPRO CARB STEADY), heparin, lidocaine (PF), lidocaine-prilocaine, mouth rinse, pentafluoroprop-tetrafluoroeth  Miscellaneous:   Assessment:  Acute hemorrhagic shock. Likely from acute gastrointestinal bleeding. Duodenal ulcer - s/p injection and cautery hemostasis. Elevated troponin - likely Acute MI. Cardiology following. LHC this week. Hypotension -stabilizing with IV resuscitaion, and blood transfusion. Chronic atrial fibrillation - rate lower after IV resuscitation. CAD s/p CABG End Stage Renal disease on hemodialysis. Hyperkalemia - treated with hemodialysis. Repeat K+ pending. CHF Hx of pulmonary embolism    Plan:  Continue protonix for at least 3 months. Check stool H Pylori Antigen if not done. Follow up with GI as needed. GI sign off for now, though remain available to see this nice man as needed in the interim.  Anthony Hess, M.D. 01/10/2023, 4:55 PM

## 2023-01-10 NOTE — Progress Notes (Signed)
*  PRELIMINARY RESULTS* Echocardiogram 2D Echocardiogram has been performed.  Anthony Hess 01/10/2023, 12:29 PM

## 2023-01-10 NOTE — Progress Notes (Signed)
PT Cancellation Note  Patient Details Name: Johnathan Mound MRN: [id number] DOB: Oct 04, 1955   Cancelled Treatment:    Reason Eval/Treat Not Completed: Patient not medically ready. Per chart review, pt's troponin levels are elevated and uptrending. Holding PT until pt medically stable, will re-attempt at further date/time.    Shauna Hugh 01/10/2023, 2:27 PM

## 2023-01-10 NOTE — Progress Notes (Signed)
Progress Note   Patient: Anthony Hess QIO:[id number] DOB: [birth year] DOA: 01/08/2023     2 DOS: the patient was seen and examined on 01/10/2023   Brief hospital course: [name] is a 67 y.o. male with medical history significant of history of hypertension, atrial fibrillation on Eliquis, ESRD on hemodialysis TTS through right tunneled IJ catheter, COPD, peripheral vascular disease Recent discharge for limb ischemia status post angiogram with angioplasty and stent placement 12/23/2022 presented to the ED complaining of shortness of breath.  Patient has been having worsening shortness of breath, weak, dizzy since last 2 days.  Missed hemodialysis Saturday due to severe shortness of breath.  Patient noted  dark stools and has been not taking Eliquis for 2 days.  Patient was last seen in the emergency department 2 days ago for similar complaints.  In the emergency department patient was noted to have very low hemoglobin of 4 while last hemoglobin greater than 12, 2 days ago.  Patient's troponin greater than 3000, white count elevated, potassium 6.3.  His blood pressure noted to be very low, started on pressors and admitted to ICU with urgent hemodialysis set up by nephrology.  01/09/2023-patient is seen by GI after cardiology clearance.  He had endoscopy procedure which showed gastritis, 2 cm hiatal hernia, oozing duodenal ulcer, cauterized.   01/10/23 - Cardiology advised heart cath given elevated troponin, hold heparin given GI bleed. Echo performed.  Assessment and Plan: Symptomatic anemia Acute blood loss anemia. Hemorrhagic shock Upper GI bleed-due to duodenal bleeding ulcer. Transfer to SDU for close monitoring of vitals. S/p total 3 units of blood transfusion with close monitoring of H&H.  Continue to hold Eliquis or heparin for prophylaxis. Hold home antihypertensive medications given low BP. PCCM signed off. BP improved now he is off pressors. S/p GI evaluation, status post EGD which  showed gastritis, bleeding duodenal ulcer which is cauterized 01/09/23.  Advised to monitor H&H, clear liquid diet and advance as tolerated. Monitor H&H and transfuse if hemoglobin less than 8.   NSTEMI versus demand ischemia H/o CAD status post CABG X 4 Possible demand ischemia from severe anemia but he does have history of CAD and high risk for MI. Follow echocardiogram results. Cardiology and elevation appreciated cleared him for GI procedure. He will go for cardiac cath 01/12/23 given very high HS troponin. Continue aspirin, statin. No heparin drip given significant GI bleed.  Lactic acidosis:  Due to hemorrhagic shock. Trend lactic acid.   ESRD on HD Hyperkalemia- Got urgent dialysis upon presentation. Nephrology follow-up appreciated. Hemodialysis as per schedule. Potassium improved.   COPD Duonebs as needed. Supplemental o2. Not in exacerbation.   Atrial fibrillation: No record of atrial fibrillation as per cardiology note. Continue telemetry. Hold Eliquis given significant GI bleed.  Peripheral vascular disease: Recent discharge for limb ischemia status post angiogram with angioplasty and stent placement 12/23/2022. Currently Eliquis on hold due to significant GI bleed.      Out of bed to chair. Incentive spirometry. Nursing supportive care. Fall, aspiration precautions. DVT prophylaxis   Code Status: Full Code  Subjective: Patient is seen and examined today morning. He is tolerating diet, no bloody bowel movements.  Hemoglobin stable.  Did discuss about elevated cardiac enzymes with patient and his wife over phone.  Physical Exam: Vitals:   01/10/23 0600 01/10/23 0700 01/10/23 0800 01/10/23 0932  BP: 113/70 (!) 141/94 (!) 147/101 (!) 137/120  Pulse: 62 69 61 (!) 42  Resp: 14 14 12  (!) 24  Temp: 99.1  F (37.3 C) 99 F (37.2 C) 99 F (37.2 C) 99.1 F (37.3 C)  TempSrc:      SpO2: 92% 95% 95% 96%  Weight:      Height:        General - Elderly  African-American male, no respiratory distress HEENT - PERRLA, EOMI, atraumatic head, pale conjunctiva Lung - Clear, diffuse rales, rhonchi, no wheezes. Heart - S1, S2 heard, no murmurs, rubs, trace pedal edema. Abdomen  -soft, non tender, non distended. Neuro - Alert, awake and oriented x 3, non focal exam. Skin -warm and dry.  Data Reviewed:      Latest Ref Rng & Units 01/10/2023    4:00 AM 01/09/2023   11:43 PM 01/09/2023   11:41 AM  CBC  WBC 4.0 - 10.5 K/uL 11.2     Hemoglobin 13.0 - 17.0 g/dL 8.4  8.6  8.3   Hematocrit 39.0 - 52.0 % 24.4  25.7  24.6   Platelets 150 - 400 K/uL 314         Latest Ref Rng & Units 01/10/2023    4:00 AM 01/09/2023    4:48 AM 01/08/2023   10:55 PM  BMP  Glucose 70 - 99 mg/dL 84  89  96   BUN 8 - 23 mg/dL 69  49  44   Creatinine 0.61 - 1.24 mg/dL 1.61  0.96  0.45   Sodium 135 - 145 mmol/L 139  135  135   Potassium 3.5 - 5.1 mmol/L 4.5  3.7  3.2   Chloride 98 - 111 mmol/L 100  95  96   CO2 22 - 32 mmol/L 26  27  24    Calcium 8.9 - 10.3 mg/dL 6.8  7.4  7.6    ECHOCARDIOGRAM COMPLETE  Result Date: 01/10/2023    ECHOCARDIOGRAM REPORT   Patient Name:   DEMORRIO Hess Date of Exam: 01/10/2023 Medical Rec #:  [id number]    Height:       62.0 in Accession #:    [id number]   Weight:       117.1 lb Date of Birth:  November 05, 1955   BSA:          1.523 m Patient Age:    66 years     BP:           141/94 mmHg Patient Gender: M            HR:           68 bpm. Exam Location:  [facility] Procedure: 2D Echo, Cardiac Doppler and Color Doppler Indications:     NSTEMI  History:         Patient has prior history of Echocardiogram examinations, most                  recent 09/15/2022. CHF, Acute MI, CAD and Previous Myocardial                  Infarction, Prior CABG, COPD, Arrythmias:Atrial Fibrillation,                  Signs/Symptoms:Dyspnea; Risk Factors:Hypertension, Dyslipidemia                  and Current Smoker. AAA, Pulmonary embolus, ESRD on dialysis.  Sonographer:      Mikki Harbor Referring Phys:  [id number] CARALYN HUDSON Diagnosing Phys: Marcina Millard MD IMPRESSIONS  1. Left ventricular ejection fraction, by estimation, is 50 to 55%. The left ventricle has low normal  function. The left ventricle has no regional wall motion abnormalities. There is moderate left ventricular hypertrophy. Left ventricular diastolic parameters were normal.  2. Right ventricular systolic function is normal. The right ventricular size is normal. There is normal pulmonary artery systolic pressure.  3. The mitral valve is normal in structure. Mild mitral valve regurgitation. No evidence of mitral stenosis.  4. The aortic valve is normal in structure. Aortic valve regurgitation is not visualized. Mild aortic valve stenosis.  5. The inferior vena cava is normal in size with greater than 50% respiratory variability, suggesting right atrial pressure of 3 mmHg. FINDINGS  Left Ventricle: Left ventricular ejection fraction, by estimation, is 50 to 55%. The left ventricle has low normal function. The left ventricle has no regional wall motion abnormalities. The left ventricular internal cavity size was normal in size. There is moderate left ventricular hypertrophy. Left ventricular diastolic parameters were normal. Right Ventricle: The right ventricular size is normal. No increase in right ventricular wall thickness. Right ventricular systolic function is normal. There is normal pulmonary artery systolic pressure. The tricuspid regurgitant velocity is 2.42 m/s, and  with an assumed right atrial pressure of 3 mmHg, the estimated right ventricular systolic pressure is 26.4 mmHg. Left Atrium: Left atrial size was normal in size. Right Atrium: Right atrial size was normal in size. Pericardium: There is no evidence of pericardial effusion. Mitral Valve: The mitral valve is normal in structure. Mild mitral valve regurgitation. No evidence of mitral valve stenosis. MV peak gradient, 3.3 mmHg. The mean mitral  valve gradient is 1.0 mmHg. Tricuspid Valve: The tricuspid valve is normal in structure. Tricuspid valve regurgitation is mild . No evidence of tricuspid stenosis. Aortic Valve: The aortic valve is normal in structure. Aortic valve regurgitation is not visualized. Mild aortic stenosis is present. Aortic valve mean gradient measures 9.0 mmHg. Aortic valve peak gradient measures 15.4 mmHg. Aortic valve area, by VTI measures 2.21 cm. Pulmonic Valve: The pulmonic valve was normal in structure. Pulmonic valve regurgitation is not visualized. No evidence of pulmonic stenosis. Aorta: The aortic root is normal in size and structure. Venous: The inferior vena cava is normal in size with greater than 50% respiratory variability, suggesting right atrial pressure of 3 mmHg. IAS/Shunts: No atrial level shunt detected by color flow Doppler.  LEFT VENTRICLE PLAX 2D LVIDd:         4.60 cm      Diastology LVIDs:         3.40 cm      LV e' medial:    6.31 cm/s LV PW:         1.40 cm      LV E/e' medial:  15.4 LV IVS:        1.40 cm      LV e' lateral:   14.00 cm/s LVOT diam:     2.00 cm      LV E/e' lateral: 7.0 LV SV:         86 LV SV Index:   57 LVOT Area:     3.14 cm  LV Volumes (MOD) LV vol d, MOD A2C: 93.8 ml LV vol d, MOD A4C: 107.0 ml LV vol s, MOD A2C: 52.7 ml LV vol s, MOD A4C: 51.1 ml LV SV MOD A2C:     41.1 ml LV SV MOD A4C:     107.0 ml LV SV MOD BP:      47.6 ml RIGHT VENTRICLE RV Basal diam:  3.90 cm RV Mid diam:  4.40 cm RV S prime:     9.68 cm/s LEFT ATRIUM             Index        RIGHT ATRIUM           Index LA diam:        4.40 cm 2.89 cm/m   RA Area:     17.90 cm LA Vol (A2C):   79.6 ml 52.28 ml/m  RA Volume:   45.10 ml  29.62 ml/m LA Vol (A4C):   57.5 ml 37.77 ml/m LA Biplane Vol: 67.7 ml 44.47 ml/m  AORTIC VALVE                     PULMONIC VALVE AV Area (Vmax):    2.12 cm      PV Vmax:       1.36 m/s AV Area (Vmean):   2.03 cm      PV Peak grad:  7.4 mmHg AV Area (VTI):     2.21 cm AV Vmax:            196.00 cm/s AV Vmean:          143.000 cm/s AV VTI:            0.389 m AV Peak Grad:      15.4 mmHg AV Mean Grad:      9.0 mmHg LVOT Vmax:         132.00 cm/s LVOT Vmean:        92.400 cm/s LVOT VTI:          0.274 m LVOT/AV VTI ratio: 0.70  AORTA Ao Root diam: 3.50 cm MITRAL VALVE               TRICUSPID VALVE MV Area (PHT): 2.62 cm    TR Peak grad:   23.4 mmHg MV Area VTI:   2.56 cm    TR Vmax:        242.00 cm/s MV Peak grad:  3.3 mmHg MV Mean grad:  1.0 mmHg    SHUNTS MV Vmax:       0.91 m/s    Systemic VTI:  0.27 m MV Vmean:      46.9 cm/s   Systemic Diam: 2.00 cm MV Decel Time: 289 msec MV E velocity: 97.30 cm/s MV A velocity: 87.80 cm/s MV E/A ratio:  1.11 Marcina Millard MD Electronically signed by Marcina Millard MD Signature Date/Time: 01/10/2023/1:43:11 PM    Final    CT Angio Chest PE W/Cm &/Or Wo Cm  Result Date: 01/08/2023 CLINICAL DATA:  Acute shortness of breath, wheezing, and hypoxia. Previous pulmonary embolism. EXAM: CT ANGIOGRAPHY CHEST WITH CONTRAST TECHNIQUE: Multidetector CT imaging of the chest was performed using the standard protocol during bolus administration of intravenous contrast. Multiplanar CT image reconstructions and MIPs were obtained to evaluate the vascular anatomy. RADIATION DOSE REDUCTION: This exam was performed according to the departmental dose-optimization program which includes automated exposure control, adjustment of the mA and/or kV according to patient size and/or use of iterative reconstruction technique. CONTRAST:  75mL OMNIPAQUE IOHEXOL 350 MG/ML SOLN COMPARISON:  10/14/2022 FINDINGS: Cardiovascular: Satisfactory opacification of pulmonary arteries noted. A single tiny nonocclusive embolus is seen in a subsegmental left lower lobe pulmonary artery branch (image 278/6) which is unchanged since previous study. No acute pulmonary embolism identified. No evidence of thoracic aortic dissection or aneurysm. Mediastinum/Nodes: No masses or pathologically  enlarged lymph nodes identified. Lungs/Pleura: Moderate centrilobular emphysema again  noted. No pulmonary mass, infiltrate, or effusion. Upper abdomen: No acute findings. Musculoskeletal: No suspicious bone lesions identified. Review of the MIP images confirms the above findings. IMPRESSION: No evidence of acute pulmonary embolism or other acute findings. 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 Emphysema (ICD10-J43.9). Electronically Signed   By: Danae Orleans M.D.   On: 01/08/2023 17:42   CT ABDOMEN PELVIS W CONTRAST  Result Date: 01/08/2023 CLINICAL DATA:  Acute abdominal pain. EXAM: CT ABDOMEN AND PELVIS WITH CONTRAST TECHNIQUE: Multidetector CT imaging of the abdomen and pelvis was performed using the standard protocol following bolus administration of intravenous contrast. RADIATION DOSE REDUCTION: This exam was performed according to the departmental dose-optimization program which includes automated exposure control, adjustment of the mA and/or kV according to patient size and/or use of iterative reconstruction technique. CONTRAST:  75mL OMNIPAQUE IOHEXOL 350 MG/ML SOLN COMPARISON:  09/03/2022 FINDINGS: Lower Chest: No acute findings. Hepatobiliary: Several previously demonstrated benign hemangiomas and cysts show no significant change since prior study. Probable tiny gallstones seen, however, there is no No evidence of cholecystitis or biliary ductal dilatation. Pancreas:  No mass or inflammatory changes. Spleen: Within normal limits in size and appearance. Adrenals/Urinary Tract: Severe bilateral diffuse renal parenchymal atrophy again seen, consistent with end-stage renal disease. No suspicious masses identified. No evidence of ureteral calculi or hydronephrosis. Diffuse bladder wall thickening again noted, which may be due to chronic bladder outlet obstruction or cystitis. Stomach/Bowel: No evidence of obstruction,  inflammatory process or abnormal fluid collections. Vascular/Lymphatic: No pathologically enlarged lymph nodes. No acute vascular findings. Reproductive:  No mass or other significant abnormality. Other:  None. Musculoskeletal:  No suspicious bone lesions identified. IMPRESSION: No acute findings. Stable diffuse bladder wall thickening, which may be due to chronic bladder outlet obstruction or cystitis. Recommend correlation with urinalysis. Probable tiny gallstones. No radiographic evidence of cholecystitis. Chronic severe bilateral renal atrophy, consistent with end-stage renal disease. No evidence of hydronephrosis. Electronically Signed   By: Danae Orleans M.D.   On: 01/08/2023 17:26     Family Communication: Discussed with patient, wife over phone. They understand and agree. All questions answereed.  Disposition: Status is: Inpatient Remains inpatient appropriate because: GI bleed, monitor BP, Hb  Planned Discharge Destination: Home with Home Health     MDM level 3- Patient has elevated troponin, got 3 units of blood transfusion, needs stepdown unit monitoring as he is is critically ill at risk for clinical deterioration.  Author: Marcelino Duster, MD 01/10/2023 2:58 PM Secure chat 7am to 7pm For on call review [email or web address].

## 2023-01-10 NOTE — Plan of Care (Signed)
  Problem: Education: Goal: Knowledge of General Education information will improve Description: Including pain rating scale, medication(s)/side effects and non-pharmacologic comfort measures Outcome: Progressing   Problem: Health Behavior/Discharge Planning: Goal: Ability to manage health-related needs will improve Outcome: Progressing   Problem: Clinical Measurements: Goal: Ability to maintain clinical measurements within normal limits will improve Outcome: Progressing Goal: Will remain free from infection Outcome: Progressing Goal: Diagnostic test results will improve Outcome: Progressing Goal: Respiratory complications will improve Outcome: Progressing Goal: Cardiovascular complication will be avoided Outcome: Progressing   Problem: Activity: Goal: Risk for activity intolerance will decrease Outcome: Progressing   Problem: Pain Management: Goal: General experience of comfort will improve Outcome: Progressing   Problem: Safety: Goal: Ability to remain free from injury will improve Outcome: Progressing

## 2023-01-10 NOTE — Progress Notes (Signed)
[facility] CLINIC CARDIOLOGY PROGRESS NOTE   Patient ID: Anthony Hess MRN: [id number] DOB/AGE: [birth year] 67 y.o.  Admit date: 01/08/2023 Referring Physician Dr. Marcelino Duster Primary Physician [name], [name], MD  Primary Cardiologist Dr. Briant Sites Reason for Consultation elevated troponin  HPI: Anthony Hess is a 67 y.o. male with a past medical history of coronary artery disease s/p CABG x4 in 2020, chronic HFrEF, hx PE on eliquis, hypertension, hyperlipidemia who presented to the ED on 01/08/2023 for shortness of breath and leg pain. Found to be anemic with Hgb 4.2. Troponins elevated. Cardiology consulted for further evaluation.   Interval History:  -Patient reports he feels well this AM. Denies any recurrence of chest pain, SOB.  -Troponins rechecked overnight and up to 16000.  -Underwent EGD yesterday which found oozing duodenal ulcer which was treated. Hgb stable this AM.  Review of systems complete and found to be negative unless listed above    Vitals:   01/10/23 0600 01/10/23 0700 01/10/23 0800 01/10/23 0932  BP: 113/70 (!) 141/94 (!) 147/101 (!) 137/120  Pulse: 62 69 61 (!) 42  Resp: 14 14 12  (!) 24  Temp: 99.1 F (37.3 C) 99 F (37.2 C) 99 F (37.2 C) 99.1 F (37.3 C)  TempSrc:      SpO2: 92% 95% 95% 96%  Weight:      Height:         Intake/Output Summary (Last 24 hours) at 01/10/2023 1139 Last data filed at 01/10/2023 1000 Gross per 24 hour  Intake 1.69 ml  Output --  Net 1.69 ml     PHYSICAL EXAM General: ill appearing male, well nourished, in no acute distress laying at a slight incline in hospital bed. HEENT: Normocephalic and atraumatic. Neck: No JVD.  Lungs: Normal respiratory effort on room air. Clear bilaterally to auscultation. No wheezes, crackles, rhonchi.  Heart: HRRR. Normal S1 and S2 without gallops or murmurs. Radial & DP pulses 2+ bilaterally. Abdomen: Non-distended appearing.  Msk: Normal strength and tone for  age. Extremities: No clubbing, cyanosis or edema.   Neuro: Alert and oriented X 3. Psych: Mood appropriate, affect congruent.    LABS: Basic Metabolic Panel: Recent Labs    01/09/23 0448 01/10/23 0400  NA 135 139  K 3.7 4.5  CL 95* 100  CO2 27 26  GLUCOSE 89 84  BUN 49* 69*  CREATININE 3.81* 5.84*  CALCIUM 7.4* 6.8*   Liver Function Tests: Recent Labs    01/08/23 1324  AST 28  ALT 17  ALKPHOS 45  BILITOT 0.2  PROT 5.2*  ALBUMIN 2.5*   Recent Labs    01/08/23 1324  LIPASE 28   CBC: Recent Labs    01/08/23 2255 01/09/23 0448 01/09/23 1141 01/09/23 2343 01/10/23 0400  WBC 17.3* 15.7*  --   --  11.2*  NEUTROABS 14.7*  --   --   --  8.2*  HGB 7.7* 7.4*   < > 8.6* 8.4*  HCT 22.2* 21.7*   < > 25.7* 24.4*  MCV 86.7 87.9  --   --  87.1  PLT 311 307  --   --  314   < > = values in this interval not displayed.   Cardiac Enzymes: Recent Labs    01/08/23 2255 01/10/23 0400 01/10/23 0653  TROPONINIHS 9,484* 16,108* 16,416*   BNP: No results for input(s): "BNP" in the last 72 hours. D-Dimer: No results for input(s): "DDIMER" in the last 72 hours. Hemoglobin A1C: No results for input(s): "HGBA1C"  in the last 72 hours. Fasting Lipid Panel: No results for input(s): "CHOL", "HDL", "LDLCALC", "TRIG", "CHOLHDL", "LDLDIRECT" in the last 72 hours. Thyroid Function Tests: No results for input(s): "TSH", "T4TOTAL", "T3FREE", "THYROIDAB" in the last 72 hours.  Invalid input(s): "FREET3" Anemia Panel: No results for input(s): "VITAMINB12", "FOLATE", "FERRITIN", "TIBC", "IRON", "RETICCTPCT" in the last 72 hours.  CT Angio Chest PE W/Cm &/Or Wo Cm  Result Date: 01/08/2023 CLINICAL DATA:  Acute shortness of breath, wheezing, and hypoxia. Previous pulmonary embolism. EXAM: CT ANGIOGRAPHY CHEST WITH CONTRAST TECHNIQUE: Multidetector CT imaging of the chest was performed using the standard protocol during bolus administration of intravenous contrast. Multiplanar CT image  reconstructions and MIPs were obtained to evaluate the vascular anatomy. RADIATION DOSE REDUCTION: This exam was performed according to the departmental dose-optimization program which includes automated exposure control, adjustment of the mA and/or kV according to patient size and/or use of iterative reconstruction technique. CONTRAST:  75mL OMNIPAQUE IOHEXOL 350 MG/ML SOLN COMPARISON:  10/14/2022 FINDINGS: Cardiovascular: Satisfactory opacification of pulmonary arteries noted. A single tiny nonocclusive embolus is seen in a subsegmental left lower lobe pulmonary artery branch (image 278/6) which is unchanged since previous study. No acute pulmonary embolism identified. No evidence of thoracic aortic dissection or aneurysm. Mediastinum/Nodes: No masses or pathologically enlarged lymph nodes identified. Lungs/Pleura: Moderate centrilobular emphysema again noted. No pulmonary mass, infiltrate, or effusion. Upper abdomen: No acute findings. Musculoskeletal: No suspicious bone lesions identified. Review of the MIP images confirms the above findings. IMPRESSION: No evidence of acute pulmonary embolism or other acute findings. 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 Emphysema (ICD10-J43.9). Electronically Signed   By: Danae Orleans M.D.   On: 01/08/2023 17:42   CT ABDOMEN PELVIS W CONTRAST  Result Date: 01/08/2023 CLINICAL DATA:  Acute abdominal pain. EXAM: CT ABDOMEN AND PELVIS WITH CONTRAST TECHNIQUE: Multidetector CT imaging of the abdomen and pelvis was performed using the standard protocol following bolus administration of intravenous contrast. RADIATION DOSE REDUCTION: This exam was performed according to the departmental dose-optimization program which includes automated exposure control, adjustment of the mA and/or kV according to patient size and/or use of iterative reconstruction technique. CONTRAST:  75mL OMNIPAQUE IOHEXOL  350 MG/ML SOLN COMPARISON:  09/03/2022 FINDINGS: Lower Chest: No acute findings. Hepatobiliary: Several previously demonstrated benign hemangiomas and cysts show no significant change since prior study. Probable tiny gallstones seen, however, there is no No evidence of cholecystitis or biliary ductal dilatation. Pancreas:  No mass or inflammatory changes. Spleen: Within normal limits in size and appearance. Adrenals/Urinary Tract: Severe bilateral diffuse renal parenchymal atrophy again seen, consistent with end-stage renal disease. No suspicious masses identified. No evidence of ureteral calculi or hydronephrosis. Diffuse bladder wall thickening again noted, which may be due to chronic bladder outlet obstruction or cystitis. Stomach/Bowel: No evidence of obstruction, inflammatory process or abnormal fluid collections. Vascular/Lymphatic: No pathologically enlarged lymph nodes. No acute vascular findings. Reproductive:  No mass or other significant abnormality. Other:  None. Musculoskeletal:  No suspicious bone lesions identified. IMPRESSION: No acute findings. Stable diffuse bladder wall thickening, which may be due to chronic bladder outlet obstruction or cystitis. Recommend correlation with urinalysis. Probable tiny gallstones. No radiographic evidence of cholecystitis. Chronic severe bilateral renal atrophy, consistent with end-stage renal disease. No evidence of hydronephrosis. Electronically Signed   By: Danae Orleans M.D.   On: 01/08/2023 17:26     ECHO pending  TELEMETRY reviewed by me 01/10/23: sinus rhythm PACs rate  70s  EKG reviewed by me 01/10/23: sinus rhythm, bifascicular block rate 75 bpm  DATA reviewed by me 01/10/23: last 24h vitals tele labs imaging I/O, hospitalist progress note, GI notes  Principal Problem:   Severe anemia Active Problems:   Atrial fibrillation, chronic (HCC)   End stage renal disease (HCC)   Leukocytosis   Hyperkalemia   Coronary artery disease involving  coronary bypass graft of native heart   Moderate protein-calorie malnutrition (HCC)   NSTEMI (non-ST elevated myocardial infarction) (HCC)   (HFpEF) heart failure with preserved ejection fraction (HCC)   Hemorrhagic shock (HCC)   ABLA (acute blood loss anemia)   Upper GI bleed   Demand ischemia of myocardium (HCC)   Gastrointestinal hemorrhage    ASSESSMENT AND PLAN: Anthony Hess is a 67 y.o. male with a past medical history of coronary artery disease s/p CABG x4 in 2020, chronic HFrEF, hx PE on eliquis, hypertension, hyperlipidemia who presented to the ED on 01/08/2023 for shortness of breath and leg pain. Found to be anemic with Hgb 4.2. Troponins elevated. Cardiology consulted for further evaluation.   # Symptomatic anemia # Bleeding duodenal ulcer Patient presented with SOB, found to be severely anemic with Hgb 4.2. S/p 3 units PRBCs. EGD yesterday with bleeding duodenal ulcer s/p cauterization. -Continue to monitor H+H closely.  -Agree with holding eliquis at this time.   # NSTEMI # Coronary artery disease s/p CABG x4 2020 Patient with hx of CAD s/p CABG, presented with CP in the setting of severe anemia. Troponins 3803 > 3348 > 9484 > 16108 > 16416. No recurrence of CP since receiving blood transfusion.  -Defer heparin given severe anemia.  -Will plan for LHC on 11/20 for further evaluation. Per GI if patient requires stent, ok to give Plavix. -Restart home aspirin 81 mg daily and atorvastatin 20 mg daily.   # ESRD on HD Patient with ESRD on TTS HD.  -Management per nephrology.  # Hx PE # ? History of atrial fibrillation Noted hx of PE from 03/2022 and taking eliquis 5mg  BID for this. He carries a diagnosis of AF in his chart, but I have not seen EKG or tele evidence of AF during this admission or during prior admissions when our service has been consulted. Continue tele monitoring while inpatient and ?outpatient ambulatory monitoring if indicated.   This patient's case was  discussed and created with Dr. Darrold Junker and he is in agreement.  Signed:  Gale Journey, PA-C  01/10/2023, 11:39 AM [facility] Cardiology

## 2023-01-10 NOTE — Progress Notes (Signed)
[facility]  ROUNDING NOTE   Subjective:   Anthony Hess is a 67 year old male with past medical conditions including hypertension, PVD, dyslipidemia, CAD, four-vessel CABG, and end-stage renal disease on hemodialysis. Patient presents for outpatient angiogram with vascular surgery and was found to have abnormal labs. He is admitted for Hyperkalemia [E87.5] SOB (shortness of breath) [R06.02] ESRD on hemodialysis (HCC) [N18.6, Z99.2] Demand ischemia of myocardium (HCC) [I24.89] Severe anemia [D64.9] Gastrointestinal hemorrhage, unspecified gastrointestinal hemorrhage type [K92.2]  Patient is known to our practice and receives outpatient dialysis at [facility] on a TTS schedule,    Patient presented to the emergency room via EMS for shortness of breath and required oxygen supplementation by facemask.  His labs show hemoglobin down to 4.  Patient also reported dark stools for the past 2 days prior to admission.  He is on Eliquis.  He was also found to have severe hyperkalemia with potassium of 6.3.  He was given shifting measures in the emergency room. He missed his Thursday outpatient dialysis treatment.  Patient received urgent hemodialysis treatment shortly after admission for hyperkalemia and was transfused 3 units of blood. He underwent EGD this admission as well which showed normal esophagus, diffuse mild inflammation in the stomach, 1 oozing cratered duodenal ulcer with pigmented material was found in the duodenal bulb.  Hemostasis was achieved with bipolar probe.  Update:  Patient resting comfortably in bed this a.m.  Serum potassium acceptable at 4.5.  Still has quite high troponin.   Objective:  Vital signs in last 24 hours:  Temp:  [99 F (37.2 C)-100.2 F (37.9 C)] 99 F (37.2 C) (11/18 0700) Pulse Rate:  [32-80] 69 (11/18 0700) Resp:  [12-23] 14 (11/18 0700) BP: (76-141)/(56-94) 141/94 (11/18 0700) SpO2:  [92 %-100 %] 95 % (11/18 0700)  Weight change:  Filed  Weights   01/08/23 1715 01/08/23 1730 01/08/23 2135  Weight: 46.6 kg 46.6 kg 53.1 kg    Intake/Output: I/O last 3 completed shifts: In: 1016.8 [I.V.:27.8; Blood:989] Out: 500 [Other:500]   Intake/Output this shift:  No intake/output data recorded.  Physical Exam: General: NAD  Head: Normocephalic, atraumatic. Moist oral mucosal membranes  Eyes: Anicteric  Neck: Supple  Lungs:  Normal breathing effort   Heart: Regular rate and rhythm  Abdomen:  Soft, nontender  Extremities: No peripheral edema.  Neurologic: Alert and oriented, answering questions appropriately  Skin: No lesions,   Access: Rt chest permcath    Basic Metabolic Panel: Recent Labs  Lab 01/06/23 1040 01/08/23 1324 01/08/23 2255 01/09/23 0448 01/10/23 0400  NA 138 137 135 135 139  K 3.9 6.3* 3.2* 3.7 4.5  CL 101 101 96* 95* 100  CO2 27 18* 24 27 26   GLUCOSE 92 141* 96 89 84  BUN 68* 144* 44* 49* 69*  CREATININE 3.86* 8.03* 3.19* 3.81* 5.84*  CALCIUM 8.0* 7.6* 7.6* 7.4* 6.8*    Liver Function Tests: Recent Labs  Lab 01/06/23 1040 01/08/23 1324  AST 21 28  ALT 19 17  ALKPHOS 41 45  BILITOT 0.3 0.2  PROT 5.4* 5.2*  ALBUMIN 2.6* 2.5*   Recent Labs  Lab 01/08/23 1324  LIPASE 28   No results for input(s): "AMMONIA" in the last 168 hours.  CBC: Recent Labs  Lab 01/06/23 1040 01/08/23 1324 01/08/23 1426 01/08/23 2255 01/09/23 0448 01/09/23 1141 01/09/23 2343 01/10/23 0400  WBC 6.8 18.4* 17.4* 17.3* 15.7*  --   --  11.2*  NEUTROABS 5.7 15.6* 14.9* 14.7*  --   --   --  8.2*  HGB 12.3* 4.2* 4.0* 7.7* 7.4* 8.3* 8.6* 8.4*  HCT 40.9 14.3* 13.5* 22.2* 21.7* 24.6* 25.7* 24.4*  MCV 93.8 100.0 100.7* 86.7 87.9  --   --  87.1  PLT 225 505* 519* 311 307  --   --  314    Cardiac Enzymes: No results for input(s): "CKTOTAL", "CKMB", "CKMBINDEX", "TROPONINI" in the last 168 hours.  BNP: Invalid input(s): "POCBNP"  CBG: Recent Labs  Lab 01/08/23 1726  GLUCAP 112*      Microbiology: Results for orders placed or performed during the hospital encounter of 01/08/23  Culture, blood (routine x 2)     Status: None (Preliminary result)   Collection Time: 01/08/23  1:24 PM   Specimen: BLOOD RIGHT ARM  Result Value Ref Range Status   Specimen Description BLOOD RIGHT ARM  Final   Special Requests   Final    BOTTLES DRAWN AEROBIC AND ANAEROBIC Blood Culture results may not be optimal due to an excessive volume of blood received in culture bottles   Culture   Final    NO GROWTH 2 DAYS Performed at [facility], [address], [place], [place] [address]    Report Status PENDING  Incomplete  Culture, blood (routine x 2)     Status: None (Preliminary result)   Collection Time: 01/08/23  2:27 PM   Specimen: BLOOD  Result Value Ref Range Status   Specimen Description BLOOD BLOOD LEFT FOREARM  Final   Special Requests   Final    BOTTLES DRAWN AEROBIC AND ANAEROBIC Blood Culture adequate volume   Culture   Final    NO GROWTH 2 DAYS Performed at [facility], [address] [place], [place] [address]    Report Status PENDING  Incomplete  MRSA Next Gen by PCR, Nasal     Status: None   Collection Time: 01/08/23  5:19 PM   Specimen: Nasal Mucosa; Nasal Swab  Result Value Ref Range Status   MRSA by PCR Next Gen NOT DETECTED NOT DETECTED Final    Comment: (NOTE) The GeneXpert MRSA Assay (FDA approved for NASAL specimens only), is one component of a comprehensive MRSA colonization surveillance program. It is not intended to diagnose MRSA infection nor to guide or monitor treatment for MRSA infections. Test performance is not FDA approved in patients less than 39 years old. Performed at [facility], [address] [place], [place] [address]     Coagulation Studies: Recent Labs    01/08/23 2255  LABPROT 15.7*  INR 1.2     Urinalysis: No results for input(s): "COLORURINE", "LABSPEC", "PHURINE", "GLUCOSEU", "HGBUR",  "BILIRUBINUR", "KETONESUR", "PROTEINUR", "UROBILINOGEN", "NITRITE", "LEUKOCYTESUR" in the last 72 hours.  Invalid input(s): "APPERANCEUR"    Imaging: CT Angio Chest PE W/Cm &/Or Wo Cm  Result Date: 01/08/2023 CLINICAL DATA:  Acute shortness of breath, wheezing, and hypoxia. Previous pulmonary embolism. EXAM: CT ANGIOGRAPHY CHEST WITH CONTRAST TECHNIQUE: Multidetector CT imaging of the chest was performed using the standard protocol during bolus administration of intravenous contrast. Multiplanar CT image reconstructions and MIPs were obtained to evaluate the vascular anatomy. RADIATION DOSE REDUCTION: This exam was performed according to the departmental dose-optimization program which includes automated exposure control, adjustment of the mA and/or kV according to patient size and/or use of iterative reconstruction technique. CONTRAST:  75mL OMNIPAQUE IOHEXOL 350 MG/ML SOLN COMPARISON:  10/14/2022 FINDINGS: Cardiovascular: Satisfactory opacification of pulmonary arteries noted. A single tiny nonocclusive embolus is seen in a subsegmental left lower lobe pulmonary  artery branch (image 278/6) which is unchanged since previous study. No acute pulmonary embolism identified. No evidence of thoracic aortic dissection or aneurysm. Mediastinum/Nodes: No masses or pathologically enlarged lymph nodes identified. Lungs/Pleura: Moderate centrilobular emphysema again noted. No pulmonary mass, infiltrate, or effusion. Upper abdomen: No acute findings. Musculoskeletal: No suspicious bone lesions identified. Review of the MIP images confirms the above findings. IMPRESSION: No evidence of acute pulmonary embolism or other acute findings. 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 Emphysema (ICD10-J43.9). Electronically Signed   By: Danae Orleans M.D.   On: 01/08/2023 17:42   CT ABDOMEN PELVIS W CONTRAST  Result Date: 01/08/2023 CLINICAL  DATA:  Acute abdominal pain. EXAM: CT ABDOMEN AND PELVIS WITH CONTRAST TECHNIQUE: Multidetector CT imaging of the abdomen and pelvis was performed using the standard protocol following bolus administration of intravenous contrast. RADIATION DOSE REDUCTION: This exam was performed according to the departmental dose-optimization program which includes automated exposure control, adjustment of the mA and/or kV according to patient size and/or use of iterative reconstruction technique. CONTRAST:  75mL OMNIPAQUE IOHEXOL 350 MG/ML SOLN COMPARISON:  09/03/2022 FINDINGS: Lower Chest: No acute findings. Hepatobiliary: Several previously demonstrated benign hemangiomas and cysts show no significant change since prior study. Probable tiny gallstones seen, however, there is no No evidence of cholecystitis or biliary ductal dilatation. Pancreas:  No mass or inflammatory changes. Spleen: Within normal limits in size and appearance. Adrenals/Urinary Tract: Severe bilateral diffuse renal parenchymal atrophy again seen, consistent with end-stage renal disease. No suspicious masses identified. No evidence of ureteral calculi or hydronephrosis. Diffuse bladder wall thickening again noted, which may be due to chronic bladder outlet obstruction or cystitis. Stomach/Bowel: No evidence of obstruction, inflammatory process or abnormal fluid collections. Vascular/Lymphatic: No pathologically enlarged lymph nodes. No acute vascular findings. Reproductive:  No mass or other significant abnormality. Other:  None. Musculoskeletal:  No suspicious bone lesions identified. IMPRESSION: No acute findings. Stable diffuse bladder wall thickening, which may be due to chronic bladder outlet obstruction or cystitis. Recommend correlation with urinalysis. Probable tiny gallstones. No radiographic evidence of cholecystitis. Chronic severe bilateral renal atrophy, consistent with end-stage renal disease. No evidence of hydronephrosis. Electronically Signed    By: Danae Orleans M.D.   On: 01/08/2023 17:26     Medications:    anticoagulant sodium citrate     norepinephrine (LEVOPHED) Adult infusion Stopped (01/08/23 2203)     sodium chloride   Intravenous Once   sodium chloride   Intravenous Once   Chlorhexidine Gluconate Cloth  6 each Topical Q0600   [START ON 01/11/2023] epoetin (EPOGEN/PROCRIT) injection  4,000 Units Intravenous Q T,Th,Sa-HD   pantoprazole (PROTONIX) IV  40 mg Intravenous Q6H   sodium zirconium cyclosilicate  10 g Oral Once   alteplase, anticoagulant sodium citrate, feeding supplement (NEPRO CARB STEADY), heparin, lidocaine (PF), lidocaine-prilocaine, mouth rinse, pentafluoroprop-tetrafluoroeth  Assessment/ Plan:  Anthony Hess is a 67 y.o.  male with past medical conditions including hypertension, PVD, dyslipidemia, CAD, four-vessel CABG, and end-stage renal disease on hemodialysis. Patient presents for outpatient angiogram with vascular surgery and was found to have abnormal labs. He is bring admitted for Hyperkalemia [E87.5] SOB (shortness of breath) [R06.02] ESRD on hemodialysis (HCC) [N18.6, Z99.2] Demand ischemia of myocardium (HCC) [I24.89] Severe anemia [D64.9] Gastrointestinal hemorrhage, unspecified gastrointestinal hemorrhage type [K92.2]   End stage renal disease with hyperkalemia on hemodialysis.  Patient underwent hemodialysis treatment on Saturday.  Serum potassium remains at the normal range of  4.5.  No immediate need for dialysis treatment today.  Will plan for hemodialysis treatment again tomorrow.  2. Anemia of chronic kidney disease and acute Blood loss.  Lab Results  Component Value Date   HGB 8.4 (L) 01/10/2023  Hemoglobin was severely low and thought to be secondary to GI bleed.  EGD today showed cratered duodenal ulcer which was treated with bipolar probe.  He received 3 units of blood transfusion with dialysis on 01/08/23.  Patient on Mircera as outpatient.  Consider Epogen while he is  admitted.  3. Secondary Hyperparathyroidism:    Lab Results  Component Value Date   PTH 170 (H) 06/25/2022   CALCIUM 6.8 (L) 01/10/2023   CAION 0.82 (LL) 10/13/2022   PHOS 9.4 (H) 11/17/2022    Monitor bone metabolism parameters during his inpatient stay.  He has had chronic hyperphosphatemia as an outpatient.  4.  Peripheral vascular disease-patient is status post angioplasty of right leg with stent placement on 12/23/2022.   LOS: 2 Tatiana Courter 11/18/20249:00 AM

## 2023-01-11 DIAGNOSIS — N186 End stage renal disease: Secondary | ICD-10-CM | POA: Diagnosis not present

## 2023-01-11 DIAGNOSIS — I2489 Other forms of acute ischemic heart disease: Secondary | ICD-10-CM | POA: Diagnosis not present

## 2023-01-11 DIAGNOSIS — D649 Anemia, unspecified: Secondary | ICD-10-CM | POA: Diagnosis not present

## 2023-01-11 DIAGNOSIS — K922 Gastrointestinal hemorrhage, unspecified: Secondary | ICD-10-CM | POA: Diagnosis not present

## 2023-01-11 LAB — RENAL FUNCTION PANEL
Albumin: 2.6 g/dL — ABNORMAL LOW (ref 3.5–5.0)
Anion gap: 11 (ref 5–15)
BUN: 37 mg/dL — ABNORMAL HIGH (ref 8–23)
CO2: 27 mmol/L (ref 22–32)
Calcium: 7.2 mg/dL — ABNORMAL LOW (ref 8.9–10.3)
Chloride: 97 mmol/L — ABNORMAL LOW (ref 98–111)
Creatinine, Ser: 4.04 mg/dL — ABNORMAL HIGH (ref 0.61–1.24)
GFR, Estimated: 16 mL/min — ABNORMAL LOW (ref >60)
Glucose, Bld: 123 mg/dL — ABNORMAL HIGH (ref 70–99)
Phosphorus: 2.6 mg/dL (ref 2.5–4.6)
Potassium: 3.9 mmol/L (ref 3.5–5.1)
Sodium: 135 mmol/L (ref 135–145)

## 2023-01-11 LAB — CBC
HCT: 25.9 % — ABNORMAL LOW (ref 39.0–52.0)
HCT: 29.4 % — ABNORMAL LOW (ref 39.0–52.0)
Hemoglobin: 8.4 g/dL — ABNORMAL LOW (ref 13.0–17.0)
Hemoglobin: 9.6 g/dL — ABNORMAL LOW (ref 13.0–17.0)
MCH: 29.6 pg (ref 26.0–34.0)
MCH: 28.9 pg (ref 26.0–34.0)
MCHC: 32.4 g/dL (ref 30.0–36.0)
MCHC: 32.7 g/dL (ref 30.0–36.0)
MCV: 91.2 fL (ref 80.0–100.0)
MCV: 88.6 fL (ref 80.0–100.0)
Platelets: 342 10*3/uL (ref 150–400)
Platelets: 360 10*3/uL (ref 150–400)
RBC: 2.84 MIL/uL — ABNORMAL LOW (ref 4.22–5.81)
RBC: 3.32 MIL/uL — ABNORMAL LOW (ref 4.22–5.81)
RDW: 18 % — ABNORMAL HIGH (ref 11.5–15.5)
RDW: 18 % — ABNORMAL HIGH (ref 11.5–15.5)
WBC: 13.5 10*3/uL — ABNORMAL HIGH (ref 4.0–10.5)
WBC: 13.7 10*3/uL — ABNORMAL HIGH (ref 4.0–10.5)
nRBC: 0 % (ref 0.0–0.2)
nRBC: 0 % (ref 0.0–0.2)

## 2023-01-11 LAB — LACTIC ACID, PLASMA: Lactic Acid, Venous: 2.9 mmol/L (ref 0.5–1.9)

## 2023-01-11 LAB — BASIC METABOLIC PANEL
Anion gap: 17 — ABNORMAL HIGH (ref 5–15)
BUN: 96 mg/dL — ABNORMAL HIGH (ref 8–23)
CO2: 22 mmol/L (ref 22–32)
Calcium: 6.3 mg/dL — CL (ref 8.9–10.3)
Chloride: 100 mmol/L (ref 98–111)
Creatinine, Ser: 8.06 mg/dL — ABNORMAL HIGH (ref 0.61–1.24)
GFR, Estimated: 7 mL/min — ABNORMAL LOW (ref >60)
Glucose, Bld: 93 mg/dL (ref 70–99)
Potassium: 4.1 mmol/L (ref 3.5–5.1)
Sodium: 139 mmol/L (ref 135–145)

## 2023-01-11 LAB — HEPATITIS B SURFACE ANTIBODY, QUANTITATIVE: Hep B S AB Quant (Post): 26.1 m[IU]/mL

## 2023-01-11 MED ORDER — SODIUM CHLORIDE 0.9 % IV SOLN
250.0000 mL | INTRAVENOUS | Status: AC | PRN
  Administered 2023-01-12: 250 mL via INTRAVENOUS

## 2023-01-11 MED ORDER — ASPIRIN 81 MG PO CHEW
81.0000 mg | CHEWABLE_TABLET | ORAL | Status: AC
  Administered 2023-01-12: 81 mg via ORAL
  Filled 2023-01-11: qty 1

## 2023-01-11 MED ORDER — SODIUM CHLORIDE 0.9 % WEIGHT BASED INFUSION: 3.0000 mL/kg/h | INTRAVENOUS | Status: AC

## 2023-01-11 MED ORDER — PANTOPRAZOLE SODIUM 40 MG IV SOLR
40.0000 mg | Freq: Two times a day (BID) | INTRAVENOUS | Status: DC
  Administered 2023-01-11 – 2023-01-12 (×3): 40 mg via INTRAVENOUS
  Filled 2023-01-11 (×3): qty 10

## 2023-01-11 MED ORDER — SODIUM CHLORIDE 0.9% FLUSH
3.0000 mL | Freq: Two times a day (BID) | INTRAVENOUS | Status: DC
  Administered 2023-01-11 – 2023-01-12 (×3): 3 mL via INTRAVENOUS

## 2023-01-11 MED ORDER — SODIUM CHLORIDE 0.9 % WEIGHT BASED INFUSION
1.0000 mL/kg/h | INTRAVENOUS | Status: DC
  Administered 2023-01-11: 1 mL/kg/h via INTRAVENOUS

## 2023-01-11 MED ORDER — EPOETIN ALFA 4000 UNIT/ML IJ SOLN
INTRAMUSCULAR | Status: AC
  Filled 2023-01-11: qty 1

## 2023-01-11 MED ORDER — HEPARIN SODIUM (PORCINE) 1000 UNIT/ML IJ SOLN
INTRAMUSCULAR | Status: AC
  Filled 2023-01-11: qty 10

## 2023-01-11 MED ORDER — SODIUM CHLORIDE 0.9% FLUSH: 3.0000 mL | INTRAVENOUS | Status: DC | PRN

## 2023-01-11 NOTE — Evaluation (Signed)
Physical Therapy Evaluation Patient Details Name: Anthony Hess MRN: [id number] DOB: Nov 26, 1955 Today's Date: 01/11/2023  History of Present Illness  Pt is a 67 y.o. male presenting to hospital 01/08/23 with c/o SOB and abdominal pain.  Pt admitted with symptomatic anemia, acute blood loss anemia, hemorrhagic shock, upper GI bleed, elevated troponin,  lactic acidosis, hyperkalemia.  S/p upper GI endoscopy 01/09/23 (which showed gastritis, 2 cm hiatal hernia, oozing duodenal ulcer, cauterized).  PMH includes htn, a-fib, ESRD on HD, COPD, CAD s/p CABG, PE, PVD, bladder CA.  Clinical Impression  Discussed pt's current medical status with PA-C Hudson (Cardiology) who cleared pt for therapy participation today.  Prior to hospital admission, pt reports being ambulatory with B axillary crutches (sometimes uses RW; uses manual w/c when watching television); lives with his wife.  0/10 pain at rest beginning of session; 5-6/10 R posterior calf pain with ambulation/end of session at rest (nurse notified and reported she would notify MD).  Currently pt is modified independent with bed mobility; CGA with transfers using RW; and CGA to ambulate 10 feet with RW use (decreased WB'ing noted through R LE d/t R posterior calf pain).  Pt denying any chest pain throughout session; no SOB noted.  Pt would currently benefit from skilled PT to address noted impairments and functional limitations (see below for any additional details).  Upon hospital discharge, pt would benefit from ongoing therapy.        If plan is discharge home, recommend the following: A little help with walking and/or transfers;A little help with bathing/dressing/bathroom;Assistance with cooking/housework;Assist for transportation;Help with stairs or ramp for entrance   Can travel by private vehicle   Yes    Equipment Recommendations Other (comment) (Pt reports having RW at home already)  Recommendations for Other Services       Functional  Status Assessment Patient has had a recent decline in their functional status and demonstrates the ability to make significant improvements in function in a reasonable and predictable amount of time.     Precautions / Restrictions Precautions Precautions: Fall Restrictions Weight Bearing Restrictions: No      Mobility  Bed Mobility Overal bed mobility: Modified Independent Bed Mobility: Supine to Sit     Supine to sit: Modified independent (Device/Increase time)     General bed mobility comments: No physical assist required    Transfers Overall transfer level: Needs assistance Equipment used: Rolling walker (2 wheels) Transfers: Sit to/from Stand Sit to Stand: Contact guard assist           General transfer comment: mild increased effort to stand (decreased WB'ing noted through R LE); vc's for UE placement; steady with RW use    Ambulation/Gait Ambulation/Gait assistance: Contact guard assist Gait Distance (Feet): 10 Feet Assistive device: Rolling walker (2 wheels)   Gait velocity: decreased     General Gait Details: antalgic; decreased stance time R LE; step to gait pattern; steady with RW use  Stairs            Wheelchair Mobility     Tilt Bed    Modified Rankin (Stroke Patients Only)       Balance Overall balance assessment: Needs assistance Sitting-balance support: No upper extremity supported, Feet supported Sitting balance-Leahy Scale: Good Sitting balance - Comments: steady reaching within BOS   Standing balance support: Bilateral upper extremity supported, During functional activity Standing balance-Leahy Scale: Good Standing balance comment: steady ambulating with RW use  Pertinent Vitals/Pain Pain Assessment Pain Assessment: 0-10 Pain Score: 6  Pain Location: R posterior calf Pain Descriptors / Indicators: Sore, Tender Pain Intervention(s): Limited activity within patient's tolerance,  Monitored during session, Repositioned, Other (comment) (RN notified) HR around 85-97 bpm and SpO2 sats 96% or greater on room air during sessions activities.    Home Living Family/patient expects to be discharged to:: Private residence Living Arrangements: Spouse/significant other Available Help at Discharge: Family;Available 24 hours/day Type of Home: House Home Access: Stairs to enter Entrance Stairs-Rails: Right;Left;Can reach both Entrance Stairs-Number of Steps: 3   Home Layout: One level Home Equipment: Rolling Walker (2 wheels);Grab bars - tub/shower;Grab bars - toilet;Crutches;Wheelchair - manual;Shower seat      Prior Function Prior Level of Function : Independent/Modified Independent             Mobility Comments: Modified independent ambulating with B axillary crutches; occasional use of RW; uses manual w/c when watching t.v.  No recent falls reported. ADLs Comments: Independent with ADL/IADL's; takes sponge baths d/t R IJ HD catheter     Extremity/Trunk Assessment   Upper Extremity Assessment Upper Extremity Assessment: Overall WFL for tasks assessed    Lower Extremity Assessment Lower Extremity Assessment: Generalized weakness RLE Deficits / Details: at least 3/5 AROM hip flexion, knee flexion/extension, and DF    Cervical / Trunk Assessment Cervical / Trunk Assessment: Normal  Communication   Communication Communication: No apparent difficulties Cueing Techniques: Verbal cues;Visual cues  Cognition Arousal: Alert Behavior During Therapy: WFL for tasks assessed/performed Overall Cognitive Status: Within Functional Limits for tasks assessed                                          General Comments  Nursing cleared pt for participation in physical therapy.  Pt agreeable to PT session (pt requesting to get up to walk).    Exercises     Assessment/Plan    PT Assessment Patient needs continued PT services  PT Problem List Decreased  strength;Decreased activity tolerance;Decreased balance;Decreased mobility;Decreased knowledge of use of DME;Decreased knowledge of precautions;Pain       PT Treatment Interventions DME instruction;Gait training;Stair training;Functional mobility training;Therapeutic activities;Therapeutic exercise;Balance training;Patient/family education    PT Goals (Current goals can be found in the Care Plan section)  Acute Rehab PT Goals Patient Stated Goal: to improve walking PT Goal Formulation: With patient Time For Goal Achievement: 01/25/23 Potential to Achieve Goals: Good    Frequency Min 1X/week     Co-evaluation               AM-PAC PT "6 Clicks" Mobility  Outcome Measure Help needed turning from your back to your side while in a flat bed without using bedrails?: None Help needed moving from lying on your back to sitting on the side of a flat bed without using bedrails?: None Help needed moving to and from a bed to a chair (including a wheelchair)?: A Little Help needed standing up from a chair using your arms (e.g., wheelchair or bedside chair)?: A Little Help needed to walk in hospital room?: A Little Help needed climbing 3-5 steps with a railing? : A Little 6 Click Score: 20    End of Session Equipment Utilized During Treatment: Gait belt Activity Tolerance: Patient tolerated treatment well Patient left: in chair;with call bell/phone within reach;Other (comment) (nurse cleared pt to sit in recliner without chair alarm (  pt in direct view of nurse at nursing station and has been demonstrating appropriate behavior)) Nurse Communication: Mobility status;Precautions;Other (comment) (R posterior calf pain (nurse reported she would notify MD)) PT Visit Diagnosis: Other abnormalities of gait and mobility (R26.89);Muscle weakness (generalized) (M62.81);Pain Pain - Right/Left: Right Pain - part of body: Leg    Time: 1610-9604 PT Time Calculation (min) (ACUTE ONLY): 27  min   Charges:   PT Evaluation $PT Eval Low Complexity: 1 Low PT Treatments $Therapeutic Activity: 8-22 mins PT General Charges $$ ACUTE PT VISIT: 1 Visit        Hendricks Limes, PT 01/11/23, 4:06 PM

## 2023-01-11 NOTE — Discharge Planning (Signed)
ESTABLISHED HEMODIALYSIS  Outpatient Facility [facility] [address] [place], [place] [address] [phone]  Schedule: TTS 6:00am  Confirmed patient is active and can resume schedule upon discharge.  Dimas Chyle Dialysis Coordinator II  [facility] Cell: [phone] eFax: [phone] [email or web address]

## 2023-01-11 NOTE — Progress Notes (Signed)
[facility] CLINIC CARDIOLOGY PROGRESS NOTE   Patient ID: Anthony Hess MRN: [id number] DOB/AGE: 65-31-1957 65 y.o.  Admit date: 01/08/2023 Referring Physician Dr. Marcelino Duster Primary Physician [name], [name], MD  Primary Cardiologist Dr. Briant Sites Reason for Consultation elevated troponin  HPI: Anthony Hess is a 67 y.o. male with a past medical history of coronary artery disease s/p CABG x4 in 2020, chronic HFrEF, hx PE on eliquis, hypertension, hyperlipidemia who presented to the ED on 01/08/2023 for shortness of breath and leg pain. Found to be anemic with Hgb 4.2. Troponins elevated. Cardiology consulted for further evaluation.   Interval History:  -Patient states he is feeling well, denies any chest pain or SOB.  -Hgb stable this AM.  -BP elevated. HR stable.   Review of systems complete and found to be negative unless listed above    Vitals:   01/11/23 0500 01/11/23 0600 01/11/23 0855 01/11/23 0902  BP: (!) 137/102 (!) 151/91  (!) 155/113  Pulse: 71 68 82 75  Resp: 12 15 20  (!) 21  Temp: 98.8 F (37.1 C) 98.8 F (37.1 C) 98.8 F (37.1 C) 98.8 F (37.1 C)  TempSrc:    Oral  SpO2: 97% 97% 100% 100%  Weight:      Height:         Intake/Output Summary (Last 24 hours) at 01/11/2023 4010 Last data filed at 01/10/2023 1800 Gross per 24 hour  Intake 241.69 ml  Output --  Net 241.69 ml     PHYSICAL EXAM General: chronically ill appearing male, well nourished, in no acute distress laying at a slight incline in hospital bed. HEENT: Normocephalic and atraumatic. Neck: No JVD.  Lungs: Normal respiratory effort on room air. Clear bilaterally to auscultation. No wheezes, crackles, rhonchi.  Heart: HRRR. Normal S1 and S2 without gallops or murmurs. Radial & DP pulses 2+ bilaterally. Abdomen: Non-distended appearing.  Msk: Normal strength and tone for age. Extremities: No clubbing, cyanosis or edema.   Neuro: Alert and oriented X 3. Psych: Mood  appropriate, affect congruent.    LABS: Basic Metabolic Panel: Recent Labs    01/10/23 0400 01/11/23 0523  NA 139 139  K 4.5 4.1  CL 100 100  CO2 26 22  GLUCOSE 84 93  BUN 69* 96*  CREATININE 5.84* 8.06*  CALCIUM 6.8* 6.3*   Liver Function Tests: Recent Labs    01/08/23 1324  AST 28  ALT 17  ALKPHOS 45  BILITOT 0.2  PROT 5.2*  ALBUMIN 2.5*   Recent Labs    01/08/23 1324  LIPASE 28   CBC: Recent Labs    01/08/23 2255 01/09/23 0448 01/10/23 0400 01/11/23 0523  WBC 17.3*   < > 11.2* 13.5*  NEUTROABS 14.7*  --  8.2*  --   HGB 7.7*   < > 8.4* 8.4*  HCT 22.2*   < > 24.4* 25.9*  MCV 86.7   < > 87.1 91.2  PLT 311   < > 314 342   < > = values in this interval not displayed.   Cardiac Enzymes: Recent Labs    01/08/23 2255 01/10/23 0400 01/10/23 0653  TROPONINIHS 9,484* 16,108* 16,416*   BNP: No results for input(s): "BNP" in the last 72 hours. D-Dimer: No results for input(s): "DDIMER" in the last 72 hours. Hemoglobin A1C: No results for input(s): "HGBA1C" in the last 72 hours. Fasting Lipid Panel: No results for input(s): "CHOL", "HDL", "LDLCALC", "TRIG", "CHOLHDL", "LDLDIRECT" in the last 72 hours. Thyroid Function Tests: No results  for input(s): "TSH", "T4TOTAL", "T3FREE", "THYROIDAB" in the last 72 hours.  Invalid input(s): "FREET3" Anemia Panel: No results for input(s): "VITAMINB12", "FOLATE", "FERRITIN", "TIBC", "IRON", "RETICCTPCT" in the last 72 hours.  ECHOCARDIOGRAM COMPLETE  Result Date: 01/10/2023    ECHOCARDIOGRAM REPORT   Patient Name:   Anthony Hess Date of Exam: 01/10/2023 Medical Rec #:  [id number]    Height:       62.0 in Accession #:    [id number]   Weight:       117.1 lb Date of Birth:  01-Jun-1955   BSA:          1.523 m Patient Age:    67 years     BP:           141/94 mmHg Patient Gender: M            HR:           68 bpm. Exam Location:  [facility] Procedure: 2D Echo, Cardiac Doppler and Color Doppler Indications:     NSTEMI  History:          Patient has prior history of Echocardiogram examinations, most                  recent 09/15/2022. CHF, Acute MI, CAD and Previous Myocardial                  Infarction, Prior CABG, COPD, Arrythmias:Atrial Fibrillation,                  Signs/Symptoms:Dyspnea; Risk Factors:Hypertension, Dyslipidemia                  and Current Smoker. AAA, Pulmonary embolus, ESRD on dialysis.  Sonographer:     Mikki Harbor Referring Phys:  [id number] Mahi Zabriskie Diagnosing Phys: Marcina Millard MD IMPRESSIONS  1. Left ventricular ejection fraction, by estimation, is 50 to 55%. The left ventricle has low normal function. The left ventricle has no regional wall motion abnormalities. There is moderate left ventricular hypertrophy. Left ventricular diastolic parameters were normal.  2. Right ventricular systolic function is normal. The right ventricular size is normal. There is normal pulmonary artery systolic pressure.  3. The mitral valve is normal in structure. Mild mitral valve regurgitation. No evidence of mitral stenosis.  4. The aortic valve is normal in structure. Aortic valve regurgitation is not visualized. Mild aortic valve stenosis.  5. The inferior vena cava is normal in size with greater than 50% respiratory variability, suggesting right atrial pressure of 3 mmHg. FINDINGS  Left Ventricle: Left ventricular ejection fraction, by estimation, is 50 to 55%. The left ventricle has low normal function. The left ventricle has no regional wall motion abnormalities. The left ventricular internal cavity size was normal in size. There is moderate left ventricular hypertrophy. Left ventricular diastolic parameters were normal. Right Ventricle: The right ventricular size is normal. No increase in right ventricular wall thickness. Right ventricular systolic function is normal. There is normal pulmonary artery systolic pressure. The tricuspid regurgitant velocity is 2.42 m/s, and  with an assumed right atrial pressure of  3 mmHg, the estimated right ventricular systolic pressure is 26.4 mmHg. Left Atrium: Left atrial size was normal in size. Right Atrium: Right atrial size was normal in size. Pericardium: There is no evidence of pericardial effusion. Mitral Valve: The mitral valve is normal in structure. Mild mitral valve regurgitation. No evidence of mitral valve stenosis. MV peak gradient, 3.3 mmHg. The mean mitral valve gradient is 1.0 mmHg.  Tricuspid Valve: The tricuspid valve is normal in structure. Tricuspid valve regurgitation is mild . No evidence of tricuspid stenosis. Aortic Valve: The aortic valve is normal in structure. Aortic valve regurgitation is not visualized. Mild aortic stenosis is present. Aortic valve mean gradient measures 9.0 mmHg. Aortic valve peak gradient measures 15.4 mmHg. Aortic valve area, by VTI measures 2.21 cm. Pulmonic Valve: The pulmonic valve was normal in structure. Pulmonic valve regurgitation is not visualized. No evidence of pulmonic stenosis. Aorta: The aortic root is normal in size and structure. Venous: The inferior vena cava is normal in size with greater than 50% respiratory variability, suggesting right atrial pressure of 3 mmHg. IAS/Shunts: No atrial level shunt detected by color flow Doppler.  LEFT VENTRICLE PLAX 2D LVIDd:         4.60 cm      Diastology LVIDs:         3.40 cm      LV e' medial:    6.31 cm/s LV PW:         1.40 cm      LV E/e' medial:  15.4 LV IVS:        1.40 cm      LV e' lateral:   14.00 cm/s LVOT diam:     2.00 cm      LV E/e' lateral: 7.0 LV SV:         86 LV SV Index:   57 LVOT Area:     3.14 cm  LV Volumes (MOD) LV vol d, MOD A2C: 93.8 ml LV vol d, MOD A4C: 107.0 ml LV vol s, MOD A2C: 52.7 ml LV vol s, MOD A4C: 51.1 ml LV SV MOD A2C:     41.1 ml LV SV MOD A4C:     107.0 ml LV SV MOD BP:      47.6 ml RIGHT VENTRICLE RV Basal diam:  3.90 cm RV Mid diam:    4.40 cm RV S prime:     9.68 cm/s LEFT ATRIUM             Index        RIGHT ATRIUM           Index LA diam:         4.40 cm 2.89 cm/m   RA Area:     17.90 cm LA Vol (A2C):   79.6 ml 52.28 ml/m  RA Volume:   45.10 ml  29.62 ml/m LA Vol (A4C):   57.5 ml 37.77 ml/m LA Biplane Vol: 67.7 ml 44.47 ml/m  AORTIC VALVE                     PULMONIC VALVE AV Area (Vmax):    2.12 cm      PV Vmax:       1.36 m/s AV Area (Vmean):   2.03 cm      PV Peak grad:  7.4 mmHg AV Area (VTI):     2.21 cm AV Vmax:           196.00 cm/s AV Vmean:          143.000 cm/s AV VTI:            0.389 m AV Peak Grad:      15.4 mmHg AV Mean Grad:      9.0 mmHg LVOT Vmax:         132.00 cm/s LVOT Vmean:        92.400 cm/s LVOT  VTI:          0.274 m LVOT/AV VTI ratio: 0.70  AORTA Ao Root diam: 3.50 cm MITRAL VALVE               TRICUSPID VALVE MV Area (PHT): 2.62 cm    TR Peak grad:   23.4 mmHg MV Area VTI:   2.56 cm    TR Vmax:        242.00 cm/s MV Peak grad:  3.3 mmHg MV Mean grad:  1.0 mmHg    SHUNTS MV Vmax:       0.91 m/s    Systemic VTI:  0.27 m MV Vmean:      46.9 cm/s   Systemic Diam: 2.00 cm MV Decel Time: 289 msec MV E velocity: 97.30 cm/s MV A velocity: 87.80 cm/s MV E/A ratio:  1.11 Marcina Millard MD Electronically signed by Marcina Millard MD Signature Date/Time: 01/10/2023/1:43:11 PM    Final      ECHO as above  TELEMETRY reviewed by me 01/11/23: sinus rhythm PACs rate 70s  EKG reviewed by me 01/11/23: sinus rhythm, bifascicular block rate 75 bpm  DATA reviewed by me 01/11/23: last 24h vitals tele labs imaging I/O, hospitalist progress note, GI notes  Principal Problem:   Severe anemia Active Problems:   Atrial fibrillation, chronic (HCC)   End stage renal disease (HCC)   Leukocytosis   Hyperkalemia   Coronary artery disease involving coronary bypass graft of native heart   Moderate protein-calorie malnutrition (HCC)   NSTEMI (non-ST elevated myocardial infarction) (HCC)   (HFpEF) heart failure with preserved ejection fraction (HCC)   Hemorrhagic shock (HCC)   ABLA (acute blood loss anemia)   Upper GI  bleed   Demand ischemia of myocardium (HCC)   Gastrointestinal hemorrhage    ASSESSMENT AND PLAN: Anthony Hess is a 67 y.o. male with a past medical history of coronary artery disease s/p CABG x4 in 2020, chronic HFrEF, hx PE on eliquis, hypertension, hyperlipidemia who presented to the ED on 01/08/2023 for shortness of breath and leg pain. Found to be anemic with Hgb 4.2. Troponins elevated. Cardiology consulted for further evaluation.   # Symptomatic anemia # Bleeding duodenal ulcer Patient presented with SOB, found to be severely anemic with Hgb 4.2. S/p 3 units PRBCs. EGD yesterday with bleeding duodenal ulcer s/p cauterization. Hgb stable this AM at 8.4. -Continue to monitor H+H closely.  -Agree with holding eliquis at this time.   # NSTEMI # Coronary artery disease s/p CABG x4 2020 Patient with hx of CAD s/p CABG, presented with CP in the setting of severe anemia. Troponins 3803 > 3348 > 9484 > 16108 > 16416. No recurrence of CP since receiving blood transfusion. Echo this admission with low normal EF 50-55%, no wall motion abnormalities.  -Defer heparin given severe anemia.  -Will plan for LHC on 11/20 for further evaluation. Per GI if patient requires stent, ok to give Plavix. -Continue home aspirin 81 mg daily and atorvastatin 20 mg daily.   # ESRD on HD Patient with ESRD on TTS HD.  -Management per nephrology.  # Hx PE # ? History of atrial fibrillation Noted hx of PE from 03/2022 and taking eliquis 5mg  BID for this. He carries a diagnosis of AF in his chart, but I have not seen EKG or tele evidence of AF during this admission or during prior admissions when our service has been consulted. Continue tele monitoring while inpatient and ?outpatient ambulatory monitoring if indicated.  This patient's case was discussed and created with Dr. Darrold Junker and he is in agreement.  Signed:  Gale Journey, PA-C  01/11/2023, 9:22 AM [facility] Cardiology

## 2023-01-11 NOTE — Progress Notes (Signed)
Progress Note   Patient: Anthony Hess:[id number] DOB: 09/11/1955 DOA: 01/08/2023     3 DOS: the patient was seen and examined on 01/11/2023   Brief hospital course: Anthony Hess is a 67 y.o. male with medical history significant of history of hypertension, atrial fibrillation on Eliquis, ESRD on hemodialysis TTS through right tunneled IJ catheter, COPD, peripheral vascular disease Recent discharge for limb ischemia status post angiogram with angioplasty and stent placement 12/23/2022 presented to the ED complaining of shortness of breath.  Patient has been having worsening shortness of breath, weak, dizzy since last 2 days.  Missed hemodialysis Saturday due to severe shortness of breath.  Patient noted  dark stools and has been not taking Eliquis for 2 days.  Patient was last seen in the emergency department 2 days ago for similar complaints.  In the emergency department patient was noted to have very low hemoglobin of 4 while last hemoglobin greater than 12, 2 days ago.  Patient's troponin greater than 3000, white count elevated, potassium 6.3.  His blood pressure noted to be very low, started on pressors and admitted to ICU with urgent hemodialysis set up by nephrology.  01/09/2023-patient is seen by GI after cardiology clearance.  He had endoscopy procedure which showed gastritis, 2 cm hiatal hernia, oozing duodenal ulcer, cauterized.   01/10/23 - Cardiology advised heart cath given elevated troponin, hold heparin given GI bleed. Echo performed.  01/11/23-he got hemodialysis today.  Heart cath scheduled for tomorrow.  No active bleeding.  Hemoglobin 8.4 stable. Transfer to PCU ordered  Assessment and Plan: Symptomatic anemia Acute blood loss anemia. Hemorrhagic shock Upper GI bleed-due to duodenal bleeding ulcer. S/p total 3 units of blood transfusion.  Continue to hold Eliquis or heparin for prophylaxis. Hold home antihypertensive medications given low BP. PCCM signed off. BP  improved, he is off pressors. S/p GI evaluation, status post EGD which showed gastritis, bleeding duodenal ulcer which is cauterized 01/09/23.   Hemoglobin stable around 9. Monitor H&H and transfuse if hemoglobin less than 8. Transfer to PCU.   NSTEMI versus demand ischemia H/o CAD status post CABG X 4 Possible demand ischemia from severe anemia but he does have history of CAD and high risk for MI. Follow echocardiogram results. Cardiology and elevation appreciated cleared him for GI procedure. He will go for cardiac cath 01/12/23 given very high HS troponin. Continue aspirin, statin. No heparin drip given significant GI bleed.  Lactic acidosis:  Due to hemorrhagic shock. Trend lactic acid.   ESRD on HD Secondary hyperparathyroidism Hyperkalemia- Got urgent dialysis upon presentation. Nephrology follow-up appreciated. Hemodialysis as per TTS schedule. Potassium improved.  Calcium 7.2.   COPD Duonebs as needed. Supplemental o2. Not in exacerbation.   ?  History of atrial fibrillation: No record of atrial fibrillation as per cardiology note. Continue telemetry. Hold Eliquis given significant GI bleed.  Peripheral vascular disease: Recent discharge for limb ischemia status post angiogram with angioplasty and stent placement 12/23/2022. Currently Eliquis on hold due to significant GI bleed.      Out of bed to chair. Incentive spirometry. Nursing supportive care. Fall, aspiration precautions. DVT prophylaxis   Code Status: Full Code  Subjective: Patient is seen and examined today morning. He is feeling better, denies any complaints.  Hemoglobin stable.  Eating fair  Physical Exam: Vitals:   01/11/23 1130 01/11/23 1200 01/11/23 1230 01/11/23 1300  BP: 120/86 (!) 126/98 119/88 (!) 145/104  Pulse: 72 86 81 73  Resp: 12 14 17 19   Temp:  97.8 F (36.6 C)  TempSrc:    Oral  SpO2: 99% 100% 99% 99%  Weight:    52 kg  Height:        General - Elderly  African-American male, no respiratory distress HEENT - PERRLA, EOMI, atraumatic head, pale conjunctiva Lung - Clear, diffuse rales, rhonchi, no wheezes. Heart - S1, S2 heard, no murmurs, rubs, trace pedal edema. Abdomen  -soft, non tender, non distended. Neuro - Alert, awake and oriented x 3, non focal exam. Skin -warm and dry.  Data Reviewed:      Latest Ref Rng & Units 01/11/2023    2:28 PM 01/11/2023    5:23 AM 01/10/2023    4:00 AM  CBC  WBC 4.0 - 10.5 K/uL 13.7  13.5  11.2   Hemoglobin 13.0 - 17.0 g/dL 9.6  8.4  8.4   Hematocrit 39.0 - 52.0 % 29.4  25.9  24.4   Platelets 150 - 400 K/uL 360  342  314       Latest Ref Rng & Units 01/11/2023    2:28 PM 01/11/2023    5:23 AM 01/10/2023    4:00 AM  BMP  Glucose 70 - 99 mg/dL 098  93  84   BUN 8 - 23 mg/dL 37  96  69   Creatinine 0.61 - 1.24 mg/dL 1.19  1.47  8.29   Sodium 135 - 145 mmol/L 135  139  139   Potassium 3.5 - 5.1 mmol/L 3.9  4.1  4.5   Chloride 98 - 111 mmol/L 97  100  100   CO2 22 - 32 mmol/L 27  22  26    Calcium 8.9 - 10.3 mg/dL 7.2  6.3  6.8    ECHOCARDIOGRAM COMPLETE  Result Date: 01/10/2023    ECHOCARDIOGRAM REPORT   Patient Name:   Anthony Hess Date of Exam: 01/10/2023 Medical Rec #:  [id number]    Height:       62.0 in Accession #:    [id number]   Weight:       117.1 lb Date of Birth:  Aug 29, 1955   BSA:          1.523 m Patient Age:    66 years     BP:           141/94 mmHg Patient Gender: M            HR:           68 bpm. Exam Location:  [facility] Procedure: 2D Echo, Cardiac Doppler and Color Doppler Indications:     NSTEMI  History:         Patient has prior history of Echocardiogram examinations, most                  recent 09/15/2022. CHF, Acute MI, CAD and Previous Myocardial                  Infarction, Prior CABG, COPD, Arrythmias:Atrial Fibrillation,                  Signs/Symptoms:Dyspnea; Risk Factors:Hypertension, Dyslipidemia                  and Current Smoker. AAA, Pulmonary embolus, ESRD on  dialysis.  Sonographer:     Mikki Harbor Referring Phys:  [id number] CARALYN HUDSON Diagnosing Phys: Marcina Millard MD IMPRESSIONS  1. Left ventricular ejection fraction, by estimation, is 50 to 55%. The left ventricle has low normal function. The left  ventricle has no regional wall motion abnormalities. There is moderate left ventricular hypertrophy. Left ventricular diastolic parameters were normal.  2. Right ventricular systolic function is normal. The right ventricular size is normal. There is normal pulmonary artery systolic pressure.  3. The mitral valve is normal in structure. Mild mitral valve regurgitation. No evidence of mitral stenosis.  4. The aortic valve is normal in structure. Aortic valve regurgitation is not visualized. Mild aortic valve stenosis.  5. The inferior vena cava is normal in size with greater than 50% respiratory variability, suggesting right atrial pressure of 3 mmHg. FINDINGS  Left Ventricle: Left ventricular ejection fraction, by estimation, is 50 to 55%. The left ventricle has low normal function. The left ventricle has no regional wall motion abnormalities. The left ventricular internal cavity size was normal in size. There is moderate left ventricular hypertrophy. Left ventricular diastolic parameters were normal. Right Ventricle: The right ventricular size is normal. No increase in right ventricular wall thickness. Right ventricular systolic function is normal. There is normal pulmonary artery systolic pressure. The tricuspid regurgitant velocity is 2.42 m/s, and  with an assumed right atrial pressure of 3 mmHg, the estimated right ventricular systolic pressure is 26.4 mmHg. Left Atrium: Left atrial size was normal in size. Right Atrium: Right atrial size was normal in size. Pericardium: There is no evidence of pericardial effusion. Mitral Valve: The mitral valve is normal in structure. Mild mitral valve regurgitation. No evidence of mitral valve stenosis. MV peak gradient,  3.3 mmHg. The mean mitral valve gradient is 1.0 mmHg. Tricuspid Valve: The tricuspid valve is normal in structure. Tricuspid valve regurgitation is mild . No evidence of tricuspid stenosis. Aortic Valve: The aortic valve is normal in structure. Aortic valve regurgitation is not visualized. Mild aortic stenosis is present. Aortic valve mean gradient measures 9.0 mmHg. Aortic valve peak gradient measures 15.4 mmHg. Aortic valve area, by VTI measures 2.21 cm. Pulmonic Valve: The pulmonic valve was normal in structure. Pulmonic valve regurgitation is not visualized. No evidence of pulmonic stenosis. Aorta: The aortic root is normal in size and structure. Venous: The inferior vena cava is normal in size with greater than 50% respiratory variability, suggesting right atrial pressure of 3 mmHg. IAS/Shunts: No atrial level shunt detected by color flow Doppler.  LEFT VENTRICLE PLAX 2D LVIDd:         4.60 cm      Diastology LVIDs:         3.40 cm      LV e' medial:    6.31 cm/s LV PW:         1.40 cm      LV E/e' medial:  15.4 LV IVS:        1.40 cm      LV e' lateral:   14.00 cm/s LVOT diam:     2.00 cm      LV E/e' lateral: 7.0 LV SV:         86 LV SV Index:   57 LVOT Area:     3.14 cm  LV Volumes (MOD) LV vol d, MOD A2C: 93.8 ml LV vol d, MOD A4C: 107.0 ml LV vol s, MOD A2C: 52.7 ml LV vol s, MOD A4C: 51.1 ml LV SV MOD A2C:     41.1 ml LV SV MOD A4C:     107.0 ml LV SV MOD BP:      47.6 ml RIGHT VENTRICLE RV Basal diam:  3.90 cm RV Mid diam:  4.40 cm RV S prime:     9.68 cm/s LEFT ATRIUM             Index        RIGHT ATRIUM           Index LA diam:        4.40 cm 2.89 cm/m   RA Area:     17.90 cm LA Vol (A2C):   79.6 ml 52.28 ml/m  RA Volume:   45.10 ml  29.62 ml/m LA Vol (A4C):   57.5 ml 37.77 ml/m LA Biplane Vol: 67.7 ml 44.47 ml/m  AORTIC VALVE                     PULMONIC VALVE AV Area (Vmax):    2.12 cm      PV Vmax:       1.36 m/s AV Area (Vmean):   2.03 cm      PV Peak grad:  7.4 mmHg AV Area (VTI):      2.21 cm AV Vmax:           196.00 cm/s AV Vmean:          143.000 cm/s AV VTI:            0.389 m AV Peak Grad:      15.4 mmHg AV Mean Grad:      9.0 mmHg LVOT Vmax:         132.00 cm/s LVOT Vmean:        92.400 cm/s LVOT VTI:          0.274 m LVOT/AV VTI ratio: 0.70  AORTA Ao Root diam: 3.50 cm MITRAL VALVE               TRICUSPID VALVE MV Area (PHT): 2.62 cm    TR Peak grad:   23.4 mmHg MV Area VTI:   2.56 cm    TR Vmax:        242.00 cm/s MV Peak grad:  3.3 mmHg MV Mean grad:  1.0 mmHg    SHUNTS MV Vmax:       0.91 m/s    Systemic VTI:  0.27 m MV Vmean:      46.9 cm/s   Systemic Diam: 2.00 cm MV Decel Time: 289 msec MV E velocity: 97.30 cm/s MV A velocity: 87.80 cm/s MV E/A ratio:  1.11 Marcina Millard MD Electronically signed by Marcina Millard MD Signature Date/Time: 01/10/2023/1:43:11 PM    Final      Family Communication: Discussed with patient, wife over phone. They understand and agree. All questions answereed.  Disposition: Status is: Inpatient Remains inpatient appropriate because: GI bleed, NSTEMI, Hb monitoring  Planned Discharge Destination: Home with Home Health     MDM level 3- Patient has elevated troponin, got 3 units of blood transfusion, needs close monitoring as he is is critically ill at risk for clinical deterioration.  Patient to go for heart cath tomorrow.  Author: Marcelino Duster, MD 01/11/2023 4:26 PM Secure chat 7am to 7pm For on call review [email or web address].

## 2023-01-11 NOTE — Plan of Care (Signed)
  Problem: Education: Goal: Knowledge of General Education information will improve Description: Including pain rating scale, medication(s)/side effects and non-pharmacologic comfort measures Outcome: Progressing   Problem: Health Behavior/Discharge Planning: Goal: Ability to manage health-related needs will improve Outcome: Progressing   Problem: Nutrition: Goal: Adequate nutrition will be maintained Outcome: Progressing   Problem: Coping: Goal: Level of anxiety will decrease Outcome: Progressing   Problem: Pain Management: Goal: General experience of comfort will improve Outcome: Progressing   Problem: Safety: Goal: Ability to remain free from injury will improve Outcome: Progressing

## 2023-01-11 NOTE — Progress Notes (Signed)
PT Cancellation Note  Patient Details Name: Leanne Buckert MRN: [id number] DOB: [birth year]   Cancelled Treatment:    Reason Eval/Treat Not Completed: Other (comment).  Chart reviewed.  Pt currently off unit at dialysis.  Will re-attempt PT evaluation at a later date/time.  Hendricks Limes, PT 01/11/23, 11:20 AM

## 2023-01-11 NOTE — Progress Notes (Signed)
[facility]  ROUNDING NOTE   Subjective:   Anthony Hess is a 67 year old male with past medical conditions including hypertension, PVD, dyslipidemia, CAD, four-vessel CABG, and end-stage renal disease on hemodialysis. Patient presents for outpatient angiogram with vascular surgery and was found to have abnormal labs. He is admitted for Hyperkalemia [E87.5] SOB (shortness of breath) [R06.02] ESRD on hemodialysis (HCC) [N18.6, Z99.2] Demand ischemia of myocardium (HCC) [I24.89] Severe anemia [D64.9] Gastrointestinal hemorrhage, unspecified gastrointestinal hemorrhage type [K92.2]  Patient is known to our practice and receives outpatient dialysis at [facility] on a TTS schedule,    Patient presented to the emergency room via EMS for shortness of breath and required oxygen supplementation by facemask.  His labs show hemoglobin down to 4.  Patient also reported dark stools for the past 2 days prior to admission.  He is on Eliquis.  He was also found to have severe hyperkalemia with potassium of 6.3.  He was given shifting measures in the emergency room. He missed his Thursday outpatient dialysis treatment.  Patient received urgent hemodialysis treatment shortly after admission for hyperkalemia and was transfused 3 units of blood. He underwent EGD this admission as well which showed normal esophagus, diffuse mild inflammation in the stomach, 1 oozing cratered duodenal ulcer with pigmented material was found in the duodenal bulb.  Hemostasis was achieved with bipolar probe.  Update:  Patient completed dialysis treatment today. Tolerated well. UF achieved was 1.9 kg. Serum potassium down to 4.1 today.  Objective:  Vital signs in last 24 hours:  Temp:  [97.8 F (36.6 C)-99.9 F (37.7 C)] 97.8 F (36.6 C) (11/19 1300) Pulse Rate:  [36-86] 73 (11/19 1300) Resp:  [12-22] 19 (11/19 1300) BP: (102-170)/(65-128) 145/104 (11/19 1300) SpO2:  [94 %-100 %] 99 % (11/19 1300) Weight:  [52  kg-54 kg] 52 kg (11/19 1300)  Weight change:  Filed Weights   01/08/23 2135 01/11/23 0902 01/11/23 1300  Weight: 53.1 kg 54 kg 52 kg    Intake/Output: I/O last 3 completed shifts: In: 241.7 [P.O.:240; I.V.:1.7] Out: -    Intake/Output this shift:  Total I/O In: -  Out: 1900 [Other:1900]  Physical Exam: General: NAD  Head: Normocephalic, atraumatic. Moist oral mucosal membranes  Eyes: Anicteric  Neck: Supple  Lungs:  Normal breathing effort   Heart: Regular rate and rhythm  Abdomen:  Soft, nontender  Extremities: No peripheral edema.  Neurologic: Alert and oriented, answering questions appropriately  Skin: No lesions,   Access: Rt chest permcath    Basic Metabolic Panel: Recent Labs  Lab 01/08/23 1324 01/08/23 2255 01/09/23 0448 01/10/23 0400 01/11/23 0523  NA 137 135 135 139 139  K 6.3* 3.2* 3.7 4.5 4.1  CL 101 96* 95* 100 100  CO2 18* 24 27 26 22   GLUCOSE 141* 96 89 84 93  BUN 144* 44* 49* 69* 96*  CREATININE 8.03* 3.19* 3.81* 5.84* 8.06*  CALCIUM 7.6* 7.6* 7.4* 6.8* 6.3*    Liver Function Tests: Recent Labs  Lab 01/06/23 1040 01/08/23 1324  AST 21 28  ALT 19 17  ALKPHOS 41 45  BILITOT 0.3 0.2  PROT 5.4* 5.2*  ALBUMIN 2.6* 2.5*   Recent Labs  Lab 01/08/23 1324  LIPASE 28   No results for input(s): "AMMONIA" in the last 168 hours.  CBC: Recent Labs  Lab 01/06/23 1040 01/08/23 1324 01/08/23 1426 01/08/23 2255 01/09/23 0448 01/09/23 1141 01/09/23 2343 01/10/23 0400 01/11/23 0523  WBC 6.8 18.4* 17.4* 17.3* 15.7*  --   --  11.2* 13.5*  NEUTROABS 5.7 15.6* 14.9* 14.7*  --   --   --  8.2*  --   HGB 12.3* 4.2* 4.0* 7.7* 7.4* 8.3* 8.6* 8.4* 8.4*  HCT 40.9 14.3* 13.5* 22.2* 21.7* 24.6* 25.7* 24.4* 25.9*  MCV 93.8 100.0 100.7* 86.7 87.9  --   --  87.1 91.2  PLT 225 505* 519* 311 307  --   --  314 342    Cardiac Enzymes: No results for input(s): "CKTOTAL", "CKMB", "CKMBINDEX", "TROPONINI" in the last 168 hours.  BNP: Invalid input(s):  "POCBNP"  CBG: Recent Labs  Lab 01/08/23 1726  GLUCAP 112*     Microbiology: Results for orders placed or performed during the hospital encounter of 01/08/23  Culture, blood (routine x 2)     Status: None (Preliminary result)   Collection Time: 01/08/23  1:24 PM   Specimen: BLOOD RIGHT ARM  Result Value Ref Range Status   Specimen Description BLOOD RIGHT ARM  Final   Special Requests   Final    BOTTLES DRAWN AEROBIC AND ANAEROBIC Blood Culture results may not be optimal due to an excessive volume of blood received in culture bottles   Culture   Final    NO GROWTH 3 DAYS Performed at [facility], [address], [place], [place] [address]    Report Status PENDING  Incomplete  Culture, blood (routine x 2)     Status: None (Preliminary result)   Collection Time: 01/08/23  2:27 PM   Specimen: BLOOD  Result Value Ref Range Status   Specimen Description BLOOD BLOOD LEFT FOREARM  Final   Special Requests   Final    BOTTLES DRAWN AEROBIC AND ANAEROBIC Blood Culture adequate volume   Culture   Final    NO GROWTH 3 DAYS Performed at [facility], [address] [place], [place] [address]    Report Status PENDING  Incomplete  MRSA Next Gen by PCR, Nasal     Status: None   Collection Time: 01/08/23  5:19 PM   Specimen: Nasal Mucosa; Nasal Swab  Result Value Ref Range Status   MRSA by PCR Next Gen NOT DETECTED NOT DETECTED Final    Comment: (NOTE) The GeneXpert MRSA Assay (FDA approved for NASAL specimens only), is one component of a comprehensive MRSA colonization surveillance program. It is not intended to diagnose MRSA infection nor to guide or monitor treatment for MRSA infections. Test performance is not FDA approved in patients less than 27 years old. Performed at [facility], [address] [place], [place] [address]     Coagulation Studies: Recent Labs    01/08/23 2255  LABPROT 15.7*  INR 1.2     Urinalysis: No results for  input(s): "COLORURINE", "LABSPEC", "PHURINE", "GLUCOSEU", "HGBUR", "BILIRUBINUR", "KETONESUR", "PROTEINUR", "UROBILINOGEN", "NITRITE", "LEUKOCYTESUR" in the last 72 hours.  Invalid input(s): "APPERANCEUR"    Imaging: ECHOCARDIOGRAM COMPLETE  Result Date: 01/10/2023    ECHOCARDIOGRAM REPORT   Patient Name:   THEODORE OHORA Date of Exam: 01/10/2023 Medical Rec #:  [id number]    Height:       62.0 in Accession #:    [id number]   Weight:       117.1 lb Date of Birth:  12-03-1955   BSA:          1.523 m Patient Age:    66 years     BP:           141/94 mmHg Patient Gender: M  HR:           68 bpm. Exam Location:  [facility] Procedure: 2D Echo, Cardiac Doppler and Color Doppler Indications:     NSTEMI  History:         Patient has prior history of Echocardiogram examinations, most                  recent 09/15/2022. CHF, Acute MI, CAD and Previous Myocardial                  Infarction, Prior CABG, COPD, Arrythmias:Atrial Fibrillation,                  Signs/Symptoms:Dyspnea; Risk Factors:Hypertension, Dyslipidemia                  and Current Smoker. AAA, Pulmonary embolus, ESRD on dialysis.  Sonographer:     Mikki Harbor Referring Phys:  [id number] CARALYN HUDSON Diagnosing Phys: Marcina Millard MD IMPRESSIONS  1. Left ventricular ejection fraction, by estimation, is 50 to 55%. The left ventricle has low normal function. The left ventricle has no regional wall motion abnormalities. There is moderate left ventricular hypertrophy. Left ventricular diastolic parameters were normal.  2. Right ventricular systolic function is normal. The right ventricular size is normal. There is normal pulmonary artery systolic pressure.  3. The mitral valve is normal in structure. Mild mitral valve regurgitation. No evidence of mitral stenosis.  4. The aortic valve is normal in structure. Aortic valve regurgitation is not visualized. Mild aortic valve stenosis.  5. The inferior vena cava is normal in size with greater than  50% respiratory variability, suggesting right atrial pressure of 3 mmHg. FINDINGS  Left Ventricle: Left ventricular ejection fraction, by estimation, is 50 to 55%. The left ventricle has low normal function. The left ventricle has no regional wall motion abnormalities. The left ventricular internal cavity size was normal in size. There is moderate left ventricular hypertrophy. Left ventricular diastolic parameters were normal. Right Ventricle: The right ventricular size is normal. No increase in right ventricular wall thickness. Right ventricular systolic function is normal. There is normal pulmonary artery systolic pressure. The tricuspid regurgitant velocity is 2.42 m/s, and  with an assumed right atrial pressure of 3 mmHg, the estimated right ventricular systolic pressure is 26.4 mmHg. Left Atrium: Left atrial size was normal in size. Right Atrium: Right atrial size was normal in size. Pericardium: There is no evidence of pericardial effusion. Mitral Valve: The mitral valve is normal in structure. Mild mitral valve regurgitation. No evidence of mitral valve stenosis. MV peak gradient, 3.3 mmHg. The mean mitral valve gradient is 1.0 mmHg. Tricuspid Valve: The tricuspid valve is normal in structure. Tricuspid valve regurgitation is mild . No evidence of tricuspid stenosis. Aortic Valve: The aortic valve is normal in structure. Aortic valve regurgitation is not visualized. Mild aortic stenosis is present. Aortic valve mean gradient measures 9.0 mmHg. Aortic valve peak gradient measures 15.4 mmHg. Aortic valve area, by VTI measures 2.21 cm. Pulmonic Valve: The pulmonic valve was normal in structure. Pulmonic valve regurgitation is not visualized. No evidence of pulmonic stenosis. Aorta: The aortic root is normal in size and structure. Venous: The inferior vena cava is normal in size with greater than 50% respiratory variability, suggesting right atrial pressure of 3 mmHg. IAS/Shunts: No atrial level shunt detected  by color flow Doppler.  LEFT VENTRICLE PLAX 2D LVIDd:         4.60 cm      Diastology LVIDs:  3.40 cm      LV e' medial:    6.31 cm/s LV PW:         1.40 cm      LV E/e' medial:  15.4 LV IVS:        1.40 cm      LV e' lateral:   14.00 cm/s LVOT diam:     2.00 cm      LV E/e' lateral: 7.0 LV SV:         86 LV SV Index:   57 LVOT Area:     3.14 cm  LV Volumes (MOD) LV vol d, MOD A2C: 93.8 ml LV vol d, MOD A4C: 107.0 ml LV vol s, MOD A2C: 52.7 ml LV vol s, MOD A4C: 51.1 ml LV SV MOD A2C:     41.1 ml LV SV MOD A4C:     107.0 ml LV SV MOD BP:      47.6 ml RIGHT VENTRICLE RV Basal diam:  3.90 cm RV Mid diam:    4.40 cm RV S prime:     9.68 cm/s LEFT ATRIUM             Index        RIGHT ATRIUM           Index LA diam:        4.40 cm 2.89 cm/m   RA Area:     17.90 cm LA Vol (A2C):   79.6 ml 52.28 ml/m  RA Volume:   45.10 ml  29.62 ml/m LA Vol (A4C):   57.5 ml 37.77 ml/m LA Biplane Vol: 67.7 ml 44.47 ml/m  AORTIC VALVE                     PULMONIC VALVE AV Area (Vmax):    2.12 cm      PV Vmax:       1.36 m/s AV Area (Vmean):   2.03 cm      PV Peak grad:  7.4 mmHg AV Area (VTI):     2.21 cm AV Vmax:           196.00 cm/s AV Vmean:          143.000 cm/s AV VTI:            0.389 m AV Peak Grad:      15.4 mmHg AV Mean Grad:      9.0 mmHg LVOT Vmax:         132.00 cm/s LVOT Vmean:        92.400 cm/s LVOT VTI:          0.274 m LVOT/AV VTI ratio: 0.70  AORTA Ao Root diam: 3.50 cm MITRAL VALVE               TRICUSPID VALVE MV Area (PHT): 2.62 cm    TR Peak grad:   23.4 mmHg MV Area VTI:   2.56 cm    TR Vmax:        242.00 cm/s MV Peak grad:  3.3 mmHg MV Mean grad:  1.0 mmHg    SHUNTS MV Vmax:       0.91 m/s    Systemic VTI:  0.27 m MV Vmean:      46.9 cm/s   Systemic Diam: 2.00 cm MV Decel Time: 289 msec MV E velocity: 97.30 cm/s MV A velocity: 87.80 cm/s MV E/A ratio:  1.11 Marcina Millard MD Electronically signed by Marcina Millard MD Signature  Date/Time: 01/10/2023/1:43:11 PM    Final       Medications:    anticoagulant sodium citrate       aspirin EC  81 mg Oral Daily   atorvastatin  20 mg Oral Daily   Chlorhexidine Gluconate Cloth  6 each Topical Q0600   epoetin (EPOGEN/PROCRIT) injection  4,000 Units Intravenous Q T,Th,Sa-HD   pantoprazole (PROTONIX) IV  40 mg Intravenous Q6H   pantoprazole (PROTONIX) IV  40 mg Intravenous Q12H   alteplase, anticoagulant sodium citrate, feeding supplement (NEPRO CARB STEADY), heparin, lidocaine (PF), lidocaine-prilocaine, mouth rinse, pentafluoroprop-tetrafluoroeth  Assessment/ Plan:  Anthony Hess is a 67 y.o.  male with past medical conditions including hypertension, PVD, dyslipidemia, CAD, four-vessel CABG, and end-stage renal disease on hemodialysis. Patient presents for outpatient angiogram with vascular surgery and was found to have abnormal labs. He is bring admitted for Hyperkalemia [E87.5] SOB (shortness of breath) [R06.02] ESRD on hemodialysis (HCC) [N18.6, Z99.2] Demand ischemia of myocardium (HCC) [I24.89] Severe anemia [D64.9] Gastrointestinal hemorrhage, unspecified gastrointestinal hemorrhage type [K92.2]   End stage renal disease with hyperkalemia on hemodialysis.  Patient completed hemodialysis treatment today.  UF achieved was 1.9 kg.  Maintain the patient on TTS dialysis treatment schedule.  2. Anemia of chronic kidney disease and acute Blood loss.  Lab Results  Component Value Date   HGB 8.4 (L) 01/11/2023  Hemoglobin was severely low and thought to be secondary to GI bleed.  EGD today showed cratered duodenal ulcer which was treated with bipolar probe.  He received 3 units of blood transfusion with dialysis on 01/08/23.  Update: Hemoglobin 8.4 today.  Continue to monitor closely.  3. Secondary Hyperparathyroidism:    Lab Results  Component Value Date   PTH 170 (H) 06/25/2022   CALCIUM 6.3 (LL) 01/11/2023   CAION 0.82 (LL) 10/13/2022   PHOS 9.4 (H) 11/17/2022    Awaiting repeat serum  phosphorus.  4.  Peripheral vascular disease-patient is status post angioplasty of right leg with stent placement on 12/23/2022.   LOS: 3 Itzel Mckibbin 11/19/20241:33 PM

## 2023-01-11 NOTE — Progress Notes (Addendum)
Hemodialysis note  Received patient in bed to unit. Alert and oriented.  Informed consent signed and in chart.  Treatment initiated: 0921 Treatment completed: 1300 Patient requested to be taken off 10 mins early.  Patient tolerated well. Transported back to room, alert without acute distress.  Report given to patient's RN.   Access used: Right Chest HD catheter Access issues: none  Total UF removed: 1.9L Medication(s) given:  Epogen 4000 units IV  Post HD weight: 52 kg   [facility]

## 2023-01-12 ENCOUNTER — Encounter: Payer: Self-pay | Admitting: Cardiology

## 2023-01-12 ENCOUNTER — Encounter
Admission: EM | Disposition: A | Discharge status: Home / Self Care | Payer: Self-pay | Source: Home / Self Care | Attending: Internal Medicine

## 2023-01-12 DIAGNOSIS — I214 Non-ST elevation (NSTEMI) myocardial infarction: Secondary | ICD-10-CM | POA: Diagnosis not present

## 2023-01-12 DIAGNOSIS — D649 Anemia, unspecified: Secondary | ICD-10-CM | POA: Diagnosis not present

## 2023-01-12 DIAGNOSIS — I482 Chronic atrial fibrillation, unspecified: Secondary | ICD-10-CM | POA: Diagnosis not present

## 2023-01-12 DIAGNOSIS — E875 Hyperkalemia: Secondary | ICD-10-CM | POA: Diagnosis not present

## 2023-01-12 HISTORY — PX: LEFT HEART CATH AND CORONARY ANGIOGRAPHY: CATH118249

## 2023-01-12 LAB — CBC
HCT: 28.3 % — ABNORMAL LOW (ref 39.0–52.0)
Hemoglobin: 9.1 g/dL — ABNORMAL LOW (ref 13.0–17.0)
MCH: 29.1 pg (ref 26.0–34.0)
MCHC: 32.2 g/dL (ref 30.0–36.0)
MCV: 90.4 fL (ref 80.0–100.0)
Platelets: 366 10*3/uL (ref 150–400)
RBC: 3.13 MIL/uL — ABNORMAL LOW (ref 4.22–5.81)
RDW: 17.9 % — ABNORMAL HIGH (ref 11.5–15.5)
WBC: 15.5 10*3/uL — ABNORMAL HIGH (ref 4.0–10.5)
nRBC: 0 % (ref 0.0–0.2)

## 2023-01-12 LAB — BASIC METABOLIC PANEL
Anion gap: 10 (ref 5–15)
BUN: 58 mg/dL — ABNORMAL HIGH (ref 8–23)
CO2: 25 mmol/L (ref 22–32)
Calcium: 6.9 mg/dL — ABNORMAL LOW (ref 8.9–10.3)
Chloride: 105 mmol/L (ref 98–111)
Creatinine, Ser: 5.76 mg/dL — ABNORMAL HIGH (ref 0.61–1.24)
GFR, Estimated: 10 mL/min — ABNORMAL LOW (ref >60)
Glucose, Bld: 111 mg/dL — ABNORMAL HIGH (ref 70–99)
Potassium: 4.3 mmol/L (ref 3.5–5.1)
Sodium: 140 mmol/L (ref 135–145)

## 2023-01-12 SURGERY — LEFT HEART CATH AND CORONARY ANGIOGRAPHY
Anesthesia: Moderate Sedation

## 2023-01-12 MED ORDER — HYDRALAZINE HCL 20 MG/ML IJ SOLN: 10.0000 mg | INTRAMUSCULAR | Status: AC | PRN

## 2023-01-12 MED ORDER — METOPROLOL TARTRATE 5 MG/5ML IV SOLN
INTRAVENOUS | Status: AC
  Filled 2023-01-12: qty 5

## 2023-01-12 MED ORDER — SODIUM CHLORIDE 0.9 % IV SOLN: 250.0000 mL | INTRAVENOUS | Status: DC | PRN

## 2023-01-12 MED ORDER — HYDRALAZINE HCL 20 MG/ML IJ SOLN
INTRAMUSCULAR | Status: AC
Start: 2023-01-12 — End: ?
  Filled 2023-01-12: qty 1

## 2023-01-12 MED ORDER — FENTANYL CITRATE (PF) 100 MCG/2ML IJ SOLN
INTRAMUSCULAR | Status: AC
  Filled 2023-01-12: qty 2

## 2023-01-12 MED ORDER — ACETAMINOPHEN 325 MG PO TABS
650.0000 mg | ORAL_TABLET | ORAL | Status: DC | PRN
  Administered 2023-01-12: 650 mg via ORAL
  Filled 2023-01-12: qty 2

## 2023-01-12 MED ORDER — HYDRALAZINE HCL 20 MG/ML IJ SOLN
INTRAMUSCULAR | Status: DC | PRN
  Administered 2023-01-12: 10 mg via INTRAVENOUS

## 2023-01-12 MED ORDER — ISOSORBIDE DINITRATE 10 MG PO TABS
10.0000 mg | ORAL_TABLET | Freq: Three times a day (TID) | ORAL | Status: DC
  Administered 2023-01-12 (×2): 10 mg via ORAL
  Filled 2023-01-12 (×4): qty 1

## 2023-01-12 MED ORDER — MIDAZOLAM HCL 2 MG/2ML IJ SOLN
INTRAMUSCULAR | Status: DC | PRN
  Administered 2023-01-12 (×2): .5 mg via INTRAVENOUS
  Administered 2023-01-12: 1 mg via INTRAVENOUS

## 2023-01-12 MED ORDER — LABETALOL HCL 5 MG/ML IV SOLN
10.0000 mg | INTRAVENOUS | Status: AC | PRN
Start: 2023-01-12 — End: 2023-01-12

## 2023-01-12 MED ORDER — LIDOCAINE HCL (PF) 1 % IJ SOLN
INTRAMUSCULAR | Status: DC | PRN
  Administered 2023-01-12: 2 mL

## 2023-01-12 MED ORDER — FENTANYL CITRATE (PF) 100 MCG/2ML IJ SOLN
INTRAMUSCULAR | Status: DC | PRN
  Administered 2023-01-12: 12.5 ug via INTRAVENOUS
  Administered 2023-01-12: 25 ug via INTRAVENOUS
  Administered 2023-01-12: 12.5 ug via INTRAVENOUS

## 2023-01-12 MED ORDER — HEPARIN (PORCINE) IN NACL 2000-0.9 UNIT/L-% IV SOLN
INTRAVENOUS | Status: DC | PRN
  Administered 2023-01-12: 1000 mL

## 2023-01-12 MED ORDER — HEPARIN (PORCINE) IN NACL 1000-0.9 UT/500ML-% IV SOLN
INTRAVENOUS | Status: DC | PRN
  Administered 2023-01-12: 500 mL

## 2023-01-12 MED ORDER — MIDAZOLAM HCL 2 MG/2ML IJ SOLN
INTRAMUSCULAR | Status: AC
  Filled 2023-01-12: qty 2

## 2023-01-12 MED ORDER — METOPROLOL TARTRATE 5 MG/5ML IV SOLN
INTRAVENOUS | Status: DC | PRN
  Administered 2023-01-12: 5 mg via INTRAVENOUS

## 2023-01-12 MED ORDER — APIXABAN 2.5 MG PO TABS: 2.5000 mg | ORAL_TABLET | Freq: Two times a day (BID) | ORAL | Status: DC

## 2023-01-12 MED ORDER — IODIXANOL 320 MG/ML IV SOLN
INTRAVENOUS | Status: DC | PRN
  Administered 2023-01-12: 141 mL

## 2023-01-12 MED ORDER — LIDOCAINE HCL 1 % IJ SOLN
INTRAMUSCULAR | Status: AC
Start: 2023-01-12 — End: ?
  Filled 2023-01-12: qty 20

## 2023-01-12 MED ORDER — APIXABAN 5 MG PO TABS: 5.0000 mg | ORAL_TABLET | Freq: Two times a day (BID) | ORAL | Status: DC

## 2023-01-12 MED ORDER — HYDRALAZINE HCL 20 MG/ML IJ SOLN
10.0000 mg | INTRAMUSCULAR | Status: DC | PRN
  Administered 2023-01-13: 10 mg via INTRAVENOUS
  Filled 2023-01-12: qty 1

## 2023-01-12 MED ORDER — ONDANSETRON HCL 4 MG/2ML IJ SOLN: 4.0000 mg | Freq: Four times a day (QID) | INTRAMUSCULAR | Status: DC | PRN

## 2023-01-12 SURGICAL SUPPLY — 13 items
CATH ANGIO 5F JB2 100CM (CATHETERS) IMPLANT
CATH INFINITI 5FR MULTPACK ANG (CATHETERS) IMPLANT
DEVICE CLOSURE MYNXGRIP 5F (Vascular Products) IMPLANT
NDL PERC 18GX7CM (NEEDLE) IMPLANT
NEEDLE PERC 18GX7CM (NEEDLE) ×1 IMPLANT
PACK CARDIAC CATH (CUSTOM PROCEDURE TRAY) ×1 IMPLANT
PROTECTION STATION PRESSURIZED (MISCELLANEOUS) ×1 IMPLANT
SET ATX-X65L (MISCELLANEOUS) IMPLANT
SHEATH AVANTI 5FR X 11CM (SHEATH) IMPLANT
STATION PROTECTION PRESSURIZED (MISCELLANEOUS) IMPLANT
WIRE EMERALD 3MM-J .035X260CM (WIRE) IMPLANT
WIRE GUIDERIGHT .035X150 (WIRE) IMPLANT
WIRE HITORQ VERSACORE ST 145CM (WIRE) IMPLANT

## 2023-01-12 NOTE — TOC Progression Note (Signed)
Transition of Care [facility]) - Progression Note    Patient Details  Name: Anthony Hess MRN: [id number] Date of Birth: Sep 14, 1955  [facility] CM/SW Contact  Darolyn Rua, [place] Phone Number: 01/12/2023, 9:23 AM  Clinical Narrative:     CSW spoke with patient's spouse and caregiver regarding hh recs, she reports she has been patients caregiver and is interested in [facility] services no preference of agency.   Reports he has all dme at home including wheel chair, RW, and a cane  PCP Dr. Rutherford Guys  Referral sent to [facility] with [facility].   Wife can transport at discharge unless patient is able to connect with [facility] who transports him as well.   No additional discharge needs identified.        Expected Discharge Plan and Services                                               Social Determinants of Health (SDOH) Interventions SDOH Screenings   Food Insecurity: No Food Insecurity (01/11/2023)  Recent Concern: Food Insecurity - Food Insecurity Present (11/16/2022)  Housing: Patient Declined (01/11/2023)  Recent Concern: Housing - Medium Risk (11/16/2022)  Transportation Needs: No Transportation Needs (12/21/2022)  Recent Concern: Transportation Needs - Unmet Transportation Needs (11/16/2022)  Utilities: Not At Risk (12/21/2022)  Recent Concern: Utilities - At Risk (11/16/2022)  Depression (PHQ2-9): Medium Risk (12/14/2022)  Financial Resource Strain: Low Risk  (08/23/2022)  Physical Activity: Inactive (08/23/2022)  Social Connections: Moderately Integrated (08/23/2022)  Stress: No Stress Concern Present (08/23/2022)  Tobacco Use: Medium Risk (01/08/2023)    Readmission Risk Interventions    11/17/2022    9:38 AM 11/16/2022    1:01 PM 09/17/2022   10:26 AM  Readmission Risk Prevention Plan  Transportation Screening Complete Complete Complete  Medication Review Oceanographer) Complete Not Complete Complete  Med Review Comments  will complete with Rn  upon discharge   PCP or Specialist appointment within 3-5 days of discharge Complete Not Complete Complete  PCP/Specialist Appt Not Complete comments pcp dr. simmons-robins SNF placement may be needed   HRI or Home Care Consult  Not Complete Complete  SW Recovery Care/Counseling Consult  Not Complete Complete  SW Consult Not Complete Comments n/a    Palliative Care Screening Not Applicable Not Applicable Not Applicable  Skilled Nursing Facility Not Applicable Not Complete Complete  SNF Comments not applicable Pt waiting PT and OT evaluation

## 2023-01-12 NOTE — Progress Notes (Signed)
[facility]  ROUNDING NOTE   Subjective:   Anthony Hess is a 67 year old male with past medical conditions including hypertension, PVD, dyslipidemia, CAD, four-vessel CABG, and end-stage renal disease on hemodialysis. Patient presents for outpatient angiogram with vascular surgery and was found to have abnormal labs. He is admitted for Hyperkalemia [E87.5] SOB (shortness of breath) [R06.02] ESRD on hemodialysis (HCC) [N18.6, Z99.2] Demand ischemia of myocardium (HCC) [I24.89] Severe anemia [D64.9] Gastrointestinal hemorrhage, unspecified gastrointestinal hemorrhage type [K92.2]  Patient is known to our practice and receives outpatient dialysis at [facility] on a TTS schedule,    Patient presented to the emergency room via EMS for shortness of breath and required oxygen supplementation by facemask.  His labs show hemoglobin down to 4.  Patient also reported dark stools for the past 2 days prior to admission.  He is on Eliquis.  He was also found to have severe hyperkalemia with potassium of 6.3.  He was given shifting measures in the emergency room. He missed his Thursday outpatient dialysis treatment.  Patient received urgent hemodialysis treatment shortly after admission for hyperkalemia and was transfused 3 units of blood. He underwent EGD this admission as well which showed normal esophagus, diffuse mild inflammation in the stomach, 1 oozing cratered duodenal ulcer with pigmented material was found in the duodenal bulb.  Hemostasis was achieved with bipolar probe.  Update:  Patient underwent hemodialysis treatment yesterday. Tolerated well. In good spirits today. Due for cardiac catheterization today.  Objective:  Vital signs in last 24 hours:  Temp:  [97.7 F (36.5 C)-99.9 F (37.7 C)] 97.7 F (36.5 C) (11/20 0743) Pulse Rate:  [30-99] 72 (11/20 0743) Resp:  [12-22] 18 (11/20 0743) BP: (82-157)/(65-104) 134/85 (11/20 0743) SpO2:  [95 %-100 %] 95 % (11/20  0743) Weight:  [52 kg] 52 kg (11/19 1300)  Weight change:  Filed Weights   01/08/23 2135 01/11/23 0902 01/11/23 1300  Weight: 53.1 kg 54 kg 52 kg    Intake/Output: I/O last 3 completed shifts: In: 480 [P.O.:480] Out: 1900 [Other:1900]   Intake/Output this shift:  No intake/output data recorded.  Physical Exam: General: NAD  Head: Normocephalic, atraumatic. Moist oral mucosal membranes  Eyes: Anicteric  Neck: Supple  Lungs:  Normal breathing effort   Heart: Regular rate and rhythm  Abdomen:  Soft, nontender  Extremities: No peripheral edema.  Neurologic: Alert and oriented, answering questions appropriately  Skin: No lesions,   Access: Rt chest permcath    Basic Metabolic Panel: Recent Labs  Lab 01/09/23 0448 01/10/23 0400 01/11/23 0523 01/11/23 1428 01/12/23 0539  NA 135 139 139 135 140  K 3.7 4.5 4.1 3.9 4.3  CL 95* 100 100 97* 105  CO2 27 26 22 27 25   GLUCOSE 89 84 93 123* 111*  BUN 49* 69* 96* 37* 58*  CREATININE 3.81* 5.84* 8.06* 4.04* 5.76*  CALCIUM 7.4* 6.8* 6.3* 7.2* 6.9*  PHOS  --   --   --  2.6  --     Liver Function Tests: Recent Labs  Lab 01/06/23 1040 01/08/23 1324 01/11/23 1428  AST 21 28  --   ALT 19 17  --   ALKPHOS 41 45  --   BILITOT 0.3 0.2  --   PROT 5.4* 5.2*  --   ALBUMIN 2.6* 2.5* 2.6*   Recent Labs  Lab 01/08/23 1324  LIPASE 28   No results for input(s): "AMMONIA" in the last 168 hours.  CBC: Recent Labs  Lab 01/06/23 1040 01/08/23  1324 01/08/23 1426 01/08/23 2255 01/09/23 0448 01/09/23 1141 01/09/23 2343 01/10/23 0400 01/11/23 0523 01/11/23 1428 01/12/23 0539  WBC 6.8 18.4* 17.4* 17.3* 15.7*  --   --  11.2* 13.5* 13.7* 15.5*  NEUTROABS 5.7 15.6* 14.9* 14.7*  --   --   --  8.2*  --   --   --   HGB 12.3* 4.2* 4.0* 7.7* 7.4*   < > 8.6* 8.4* 8.4* 9.6* 9.1*  HCT 40.9 14.3* 13.5* 22.2* 21.7*   < > 25.7* 24.4* 25.9* 29.4* 28.3*  MCV 93.8 100.0 100.7* 86.7 87.9  --   --  87.1 91.2 88.6 90.4  PLT 225 505* 519* 311  307  --   --  314 342 360 366   < > = values in this interval not displayed.    Cardiac Enzymes: No results for input(s): "CKTOTAL", "CKMB", "CKMBINDEX", "TROPONINI" in the last 168 hours.  BNP: Invalid input(s): "POCBNP"  CBG: Recent Labs  Lab 01/08/23 1726  GLUCAP 112*     Microbiology: Results for orders placed or performed during the hospital encounter of 01/08/23  Culture, blood (routine x 2)     Status: None (Preliminary result)   Collection Time: 01/08/23  1:24 PM   Specimen: BLOOD RIGHT ARM  Result Value Ref Range Status   Specimen Description BLOOD RIGHT ARM  Final   Special Requests   Final    BOTTLES DRAWN AEROBIC AND ANAEROBIC Blood Culture results may not be optimal due to an excessive volume of blood received in culture bottles   Culture   Final    NO GROWTH 4 DAYS Performed at [facility], [address], [place], [place] [address]    Report Status PENDING  Incomplete  Culture, blood (routine x 2)     Status: None (Preliminary result)   Collection Time: 01/08/23  2:27 PM   Specimen: BLOOD  Result Value Ref Range Status   Specimen Description BLOOD BLOOD LEFT FOREARM  Final   Special Requests   Final    BOTTLES DRAWN AEROBIC AND ANAEROBIC Blood Culture adequate volume   Culture   Final    NO GROWTH 4 DAYS Performed at [facility], [address] [place], [place] [address]    Report Status PENDING  Incomplete  MRSA Next Gen by PCR, Nasal     Status: None   Collection Time: 01/08/23  5:19 PM   Specimen: Nasal Mucosa; Nasal Swab  Result Value Ref Range Status   MRSA by PCR Next Gen NOT DETECTED NOT DETECTED Final    Comment: (NOTE) The GeneXpert MRSA Assay (FDA approved for NASAL specimens only), is one component of a comprehensive MRSA colonization surveillance program. It is not intended to diagnose MRSA infection nor to guide or monitor treatment for MRSA infections. Test performance is not FDA approved in patients less than 61  years old. Performed at [facility], [address] [place], [place] [address]     Coagulation Studies: No results for input(s): "LABPROT", "INR" in the last 72 hours.    Urinalysis: No results for input(s): "COLORURINE", "LABSPEC", "PHURINE", "GLUCOSEU", "HGBUR", "BILIRUBINUR", "KETONESUR", "PROTEINUR", "UROBILINOGEN", "NITRITE", "LEUKOCYTESUR" in the last 72 hours.  Invalid input(s): "APPERANCEUR"    Imaging: ECHOCARDIOGRAM COMPLETE  Result Date: 01/10/2023    ECHOCARDIOGRAM REPORT   Patient Name:   RAJAT BAGENT Date of Exam: 01/10/2023 Medical Rec #:  [id number]    Height:       62.0 in Accession #:  [id number]   Weight:       117.1 lb Date of Birth:  Dec 28, 1955   BSA:          1.523 m Patient Age:    66 years     BP:           141/94 mmHg Patient Gender: M            HR:           68 bpm. Exam Location:  [facility] Procedure: 2D Echo, Cardiac Doppler and Color Doppler Indications:     NSTEMI  History:         Patient has prior history of Echocardiogram examinations, most                  recent 09/15/2022. CHF, Acute MI, CAD and Previous Myocardial                  Infarction, Prior CABG, COPD, Arrythmias:Atrial Fibrillation,                  Signs/Symptoms:Dyspnea; Risk Factors:Hypertension, Dyslipidemia                  and Current Smoker. AAA, Pulmonary embolus, ESRD on dialysis.  Sonographer:     Mikki Harbor Referring Phys:  [id number] CARALYN HUDSON Diagnosing Phys: Marcina Millard MD IMPRESSIONS  1. Left ventricular ejection fraction, by estimation, is 50 to 55%. The left ventricle has low normal function. The left ventricle has no regional wall motion abnormalities. There is moderate left ventricular hypertrophy. Left ventricular diastolic parameters were normal.  2. Right ventricular systolic function is normal. The right ventricular size is normal. There is normal pulmonary artery systolic pressure.  3. The mitral valve is normal in structure. Mild mitral valve  regurgitation. No evidence of mitral stenosis.  4. The aortic valve is normal in structure. Aortic valve regurgitation is not visualized. Mild aortic valve stenosis.  5. The inferior vena cava is normal in size with greater than 50% respiratory variability, suggesting right atrial pressure of 3 mmHg. FINDINGS  Left Ventricle: Left ventricular ejection fraction, by estimation, is 50 to 55%. The left ventricle has low normal function. The left ventricle has no regional wall motion abnormalities. The left ventricular internal cavity size was normal in size. There is moderate left ventricular hypertrophy. Left ventricular diastolic parameters were normal. Right Ventricle: The right ventricular size is normal. No increase in right ventricular wall thickness. Right ventricular systolic function is normal. There is normal pulmonary artery systolic pressure. The tricuspid regurgitant velocity is 2.42 m/s, and  with an assumed right atrial pressure of 3 mmHg, the estimated right ventricular systolic pressure is 26.4 mmHg. Left Atrium: Left atrial size was normal in size. Right Atrium: Right atrial size was normal in size. Pericardium: There is no evidence of pericardial effusion. Mitral Valve: The mitral valve is normal in structure. Mild mitral valve regurgitation. No evidence of mitral valve stenosis. MV peak gradient, 3.3 mmHg. The mean mitral valve gradient is 1.0 mmHg. Tricuspid Valve: The tricuspid valve is normal in structure. Tricuspid valve regurgitation is mild . No evidence of tricuspid stenosis. Aortic Valve: The aortic valve is normal in structure. Aortic valve regurgitation is not visualized. Mild aortic stenosis is present. Aortic valve mean gradient measures 9.0 mmHg. Aortic valve peak gradient measures 15.4 mmHg. Aortic valve area, by VTI measures 2.21 cm. Pulmonic Valve: The pulmonic valve was normal in structure. Pulmonic valve regurgitation is not visualized. No evidence  of pulmonic stenosis. Aorta: The  aortic root is normal in size and structure. Venous: The inferior vena cava is normal in size with greater than 50% respiratory variability, suggesting right atrial pressure of 3 mmHg. IAS/Shunts: No atrial level shunt detected by color flow Doppler.  LEFT VENTRICLE PLAX 2D LVIDd:         4.60 cm      Diastology LVIDs:         3.40 cm      LV e' medial:    6.31 cm/s LV PW:         1.40 cm      LV E/e' medial:  15.4 LV IVS:        1.40 cm      LV e' lateral:   14.00 cm/s LVOT diam:     2.00 cm      LV E/e' lateral: 7.0 LV SV:         86 LV SV Index:   57 LVOT Area:     3.14 cm  LV Volumes (MOD) LV vol d, MOD A2C: 93.8 ml LV vol d, MOD A4C: 107.0 ml LV vol s, MOD A2C: 52.7 ml LV vol s, MOD A4C: 51.1 ml LV SV MOD A2C:     41.1 ml LV SV MOD A4C:     107.0 ml LV SV MOD BP:      47.6 ml RIGHT VENTRICLE RV Basal diam:  3.90 cm RV Mid diam:    4.40 cm RV S prime:     9.68 cm/s LEFT ATRIUM             Index        RIGHT ATRIUM           Index LA diam:        4.40 cm 2.89 cm/m   RA Area:     17.90 cm LA Vol (A2C):   79.6 ml 52.28 ml/m  RA Volume:   45.10 ml  29.62 ml/m LA Vol (A4C):   57.5 ml 37.77 ml/m LA Biplane Vol: 67.7 ml 44.47 ml/m  AORTIC VALVE                     PULMONIC VALVE AV Area (Vmax):    2.12 cm      PV Vmax:       1.36 m/s AV Area (Vmean):   2.03 cm      PV Peak grad:  7.4 mmHg AV Area (VTI):     2.21 cm AV Vmax:           196.00 cm/s AV Vmean:          143.000 cm/s AV VTI:            0.389 m AV Peak Grad:      15.4 mmHg AV Mean Grad:      9.0 mmHg LVOT Vmax:         132.00 cm/s LVOT Vmean:        92.400 cm/s LVOT VTI:          0.274 m LVOT/AV VTI ratio: 0.70  AORTA Ao Root diam: 3.50 cm MITRAL VALVE               TRICUSPID VALVE MV Area (PHT): 2.62 cm    TR Peak grad:   23.4 mmHg MV Area VTI:   2.56 cm    TR Vmax:        242.00 cm/s MV Peak  grad:  3.3 mmHg MV Mean grad:  1.0 mmHg    SHUNTS MV Vmax:       0.91 m/s    Systemic VTI:  0.27 m MV Vmean:      46.9 cm/s   Systemic Diam: 2.00 cm MV Decel  Time: 289 msec MV E velocity: 97.30 cm/s MV A velocity: 87.80 cm/s MV E/A ratio:  1.11 Marcina Millard MD Electronically signed by Marcina Millard MD Signature Date/Time: 01/10/2023/1:43:11 PM    Final      Medications:    sodium chloride     sodium chloride Stopped (01/11/23 1930)     aspirin EC  81 mg Oral Daily   atorvastatin  20 mg Oral Daily   Chlorhexidine Gluconate Cloth  6 each Topical Q0600   epoetin (EPOGEN/PROCRIT) injection  4,000 Units Intravenous Q T,Th,Sa-HD   pantoprazole (PROTONIX) IV  40 mg Intravenous Q12H   sodium chloride flush  3 mL Intravenous Q12H   sodium chloride, mouth rinse, sodium chloride flush  Assessment/ Plan:  Anthony Hess is a 67 y.o.  male with past medical conditions including hypertension, PVD, dyslipidemia, CAD, four-vessel CABG, and end-stage renal disease on hemodialysis. Patient presents for outpatient angiogram with vascular surgery and was found to have abnormal labs. He is bring admitted for Hyperkalemia [E87.5] SOB (shortness of breath) [R06.02] ESRD on hemodialysis (HCC) [N18.6, Z99.2] Demand ischemia of myocardium (HCC) [I24.89] Severe anemia [D64.9] Gastrointestinal hemorrhage, unspecified gastrointestinal hemorrhage type [K92.2]   End stage renal disease with hyperkalemia on hemodialysis.  Patient underwent hemodialysis treatment yesterday.  Tolerated well.  No acute indication for dialysis treatment today.  If still here tomorrow we will plan for hemodialysis treatment.  2. Anemia of chronic kidney disease and acute Blood loss.  Lab Results  Component Value Date   HGB 9.1 (L) 01/12/2023  Hemoglobin was severely low and thought to be secondary to GI bleed.  EGD today showed cratered duodenal ulcer which was treated with bipolar probe.  He received 3 units of blood transfusion with dialysis on 01/08/23.  Update: Hemoglobin up to 9.1.  Continue to monitor CBC.  He will be maintained on Mircera as outpatient.  Continue  Epogen while here.  3. Secondary Hyperparathyroidism:    Lab Results  Component Value Date   PTH 170 (H) 06/25/2022   CALCIUM 6.9 (L) 01/12/2023   CAION 0.82 (LL) 10/13/2022   PHOS 2.6 01/11/2023    Phosphorus currently at target at 2.6.  4.  Peripheral vascular disease-patient is status post angioplasty of right leg with stent placement on 12/23/2022.   LOS: 4 Zayden Maffei 11/20/20249:35 AM

## 2023-01-12 NOTE — Progress Notes (Signed)
[facility] CLINIC CARDIOLOGY PROGRESS NOTE   Patient ID: [name] MRN: [id number] DOB/AGE: Mar 29, 1955 67 y.o.  Admit date: 01/08/2023 Referring Physician Dr. Marcelino Duster Primary Physician [name], [name], MD  Primary Cardiologist Dr. Briant Sites Reason for Consultation elevated troponin  HPI: Anthony Hess is a 67 y.o. male with a past medical history of coronary artery disease s/p CABG x4 in 2020, chronic HFrEF, hx PE on eliquis, hypertension, hyperlipidemia who presented to the ED on 01/08/2023 for shortness of breath and leg pain. Found to be anemic with Hgb 4.2. Troponins elevated. Cardiology consulted for further evaluation.   Interval History:  -Patient resting comfortably this AM, he is without complaint. Denies CP or SOB. -Hgb stable this AM. Underwent dialysis yesterday. -BP and HR controlled this AM.   Review of systems complete and found to be negative unless listed above    Vitals:   01/11/23 2024 01/12/23 0015 01/12/23 0325 01/12/23 0743  BP: 97/76 96/72 102/72 134/85  Pulse: 88 73 80 72  Resp: 17 18  18   Temp: 98.8 F (37.1 C) 98.6 F (37 C) 98.5 F (36.9 C) 97.7 F (36.5 C)  TempSrc:   Oral Oral  SpO2: 100% 100% 100% 95%  Weight:      Height:         Intake/Output Summary (Last 24 hours) at 01/12/2023 0859 Last data filed at 01/11/2023 2200 Gross per 24 hour  Intake 480 ml  Output 1900 ml  Net -1420 ml     PHYSICAL EXAM General: chronically ill appearing male, well nourished, in no acute distress laying at a slight incline in hospital bed. HEENT: Normocephalic and atraumatic. Neck: No JVD.  Lungs: Normal respiratory effort on room air. Clear bilaterally to auscultation. No wheezes, crackles, rhonchi.  Heart: HRRR. Normal S1 and S2 without gallops or murmurs. Radial & DP pulses 2+ bilaterally. Abdomen: Non-distended appearing.  Msk: Normal strength and tone for age. Extremities: No clubbing, cyanosis or edema.   Neuro:  Alert and oriented X 3. Psych: Mood appropriate, affect congruent.    LABS: Basic Metabolic Panel: Recent Labs    01/11/23 1428 01/12/23 0539  NA 135 140  K 3.9 4.3  CL 97* 105  CO2 27 25  GLUCOSE 123* 111*  BUN 37* 58*  CREATININE 4.04* 5.76*  CALCIUM 7.2* 6.9*  PHOS 2.6  --    Liver Function Tests: Recent Labs    01/11/23 1428  ALBUMIN 2.6*   No results for input(s): "LIPASE", "AMYLASE" in the last 72 hours.  CBC: Recent Labs    01/10/23 0400 01/11/23 0523 01/11/23 1428 01/12/23 0539  WBC 11.2*   < > 13.7* 15.5*  NEUTROABS 8.2*  --   --   --   HGB 8.4*   < > 9.6* 9.1*  HCT 24.4*   < > 29.4* 28.3*  MCV 87.1   < > 88.6 90.4  PLT 314   < > 360 366   < > = values in this interval not displayed.   Cardiac Enzymes: Recent Labs    01/10/23 0400 01/10/23 0653  TROPONINIHS 16,108* 16,416*   BNP: No results for input(s): "BNP" in the last 72 hours. D-Dimer: No results for input(s): "DDIMER" in the last 72 hours. Hemoglobin A1C: No results for input(s): "HGBA1C" in the last 72 hours. Fasting Lipid Panel: No results for input(s): "CHOL", "HDL", "LDLCALC", "TRIG", "CHOLHDL", "LDLDIRECT" in the last 72 hours. Thyroid Function Tests: No results for input(s): "TSH", "T4TOTAL", "T3FREE", "THYROIDAB" in the last  72 hours.  Invalid input(s): "FREET3" Anemia Panel: No results for input(s): "VITAMINB12", "FOLATE", "FERRITIN", "TIBC", "IRON", "RETICCTPCT" in the last 72 hours.  ECHOCARDIOGRAM COMPLETE  Result Date: 01/10/2023    ECHOCARDIOGRAM REPORT   Patient Name:   Anthony Hess Date of Exam: 01/10/2023 Medical Rec #:  [id number]    Height:       62.0 in Accession #:    [id number]   Weight:       117.1 lb Date of Birth:  Jun 08, 1955   BSA:          1.523 m Patient Age:    67 years     BP:           141/94 mmHg Patient Gender: M            HR:           68 bpm. Exam Location:  [facility] Procedure: 2D Echo, Cardiac Doppler and Color Doppler Indications:     NSTEMI  History:          Patient has prior history of Echocardiogram examinations, most                  recent 09/15/2022. CHF, Acute MI, CAD and Previous Myocardial                  Infarction, Prior CABG, COPD, Arrythmias:Atrial Fibrillation,                  Signs/Symptoms:Dyspnea; Risk Factors:Hypertension, Dyslipidemia                  and Current Smoker. AAA, Pulmonary embolus, ESRD on dialysis.  Sonographer:     Mikki Harbor Referring Phys:  [id number] Chiana Wamser Diagnosing Phys: Marcina Millard MD IMPRESSIONS  1. Left ventricular ejection fraction, by estimation, is 50 to 55%. The left ventricle has low normal function. The left ventricle has no regional wall motion abnormalities. There is moderate left ventricular hypertrophy. Left ventricular diastolic parameters were normal.  2. Right ventricular systolic function is normal. The right ventricular size is normal. There is normal pulmonary artery systolic pressure.  3. The mitral valve is normal in structure. Mild mitral valve regurgitation. No evidence of mitral stenosis.  4. The aortic valve is normal in structure. Aortic valve regurgitation is not visualized. Mild aortic valve stenosis.  5. The inferior vena cava is normal in size with greater than 50% respiratory variability, suggesting right atrial pressure of 3 mmHg. FINDINGS  Left Ventricle: Left ventricular ejection fraction, by estimation, is 50 to 55%. The left ventricle has low normal function. The left ventricle has no regional wall motion abnormalities. The left ventricular internal cavity size was normal in size. There is moderate left ventricular hypertrophy. Left ventricular diastolic parameters were normal. Right Ventricle: The right ventricular size is normal. No increase in right ventricular wall thickness. Right ventricular systolic function is normal. There is normal pulmonary artery systolic pressure. The tricuspid regurgitant velocity is 2.42 m/s, and  with an assumed right atrial pressure of  3 mmHg, the estimated right ventricular systolic pressure is 26.4 mmHg. Left Atrium: Left atrial size was normal in size. Right Atrium: Right atrial size was normal in size. Pericardium: There is no evidence of pericardial effusion. Mitral Valve: The mitral valve is normal in structure. Mild mitral valve regurgitation. No evidence of mitral valve stenosis. MV peak gradient, 3.3 mmHg. The mean mitral valve gradient is 1.0 mmHg. Tricuspid Valve: The tricuspid valve is normal in structure.  Tricuspid valve regurgitation is mild . No evidence of tricuspid stenosis. Aortic Valve: The aortic valve is normal in structure. Aortic valve regurgitation is not visualized. Mild aortic stenosis is present. Aortic valve mean gradient measures 9.0 mmHg. Aortic valve peak gradient measures 15.4 mmHg. Aortic valve area, by VTI measures 2.21 cm. Pulmonic Valve: The pulmonic valve was normal in structure. Pulmonic valve regurgitation is not visualized. No evidence of pulmonic stenosis. Aorta: The aortic root is normal in size and structure. Venous: The inferior vena cava is normal in size with greater than 50% respiratory variability, suggesting right atrial pressure of 3 mmHg. IAS/Shunts: No atrial level shunt detected by color flow Doppler.  LEFT VENTRICLE PLAX 2D LVIDd:         4.60 cm      Diastology LVIDs:         3.40 cm      LV e' medial:    6.31 cm/s LV PW:         1.40 cm      LV E/e' medial:  15.4 LV IVS:        1.40 cm      LV e' lateral:   14.00 cm/s LVOT diam:     2.00 cm      LV E/e' lateral: 7.0 LV SV:         86 LV SV Index:   57 LVOT Area:     3.14 cm  LV Volumes (MOD) LV vol d, MOD A2C: 93.8 ml LV vol d, MOD A4C: 107.0 ml LV vol s, MOD A2C: 52.7 ml LV vol s, MOD A4C: 51.1 ml LV SV MOD A2C:     41.1 ml LV SV MOD A4C:     107.0 ml LV SV MOD BP:      47.6 ml RIGHT VENTRICLE RV Basal diam:  3.90 cm RV Mid diam:    4.40 cm RV S prime:     9.68 cm/s LEFT ATRIUM             Index        RIGHT ATRIUM           Index LA diam:         4.40 cm 2.89 cm/m   RA Area:     17.90 cm LA Vol (A2C):   79.6 ml 52.28 ml/m  RA Volume:   45.10 ml  29.62 ml/m LA Vol (A4C):   57.5 ml 37.77 ml/m LA Biplane Vol: 67.7 ml 44.47 ml/m  AORTIC VALVE                     PULMONIC VALVE AV Area (Vmax):    2.12 cm      PV Vmax:       1.36 m/s AV Area (Vmean):   2.03 cm      PV Peak grad:  7.4 mmHg AV Area (VTI):     2.21 cm AV Vmax:           196.00 cm/s AV Vmean:          143.000 cm/s AV VTI:            0.389 m AV Peak Grad:      15.4 mmHg AV Mean Grad:      9.0 mmHg LVOT Vmax:         132.00 cm/s LVOT Vmean:        92.400 cm/s LVOT VTI:  0.274 m LVOT/AV VTI ratio: 0.70  AORTA Ao Root diam: 3.50 cm MITRAL VALVE               TRICUSPID VALVE MV Area (PHT): 2.62 cm    TR Peak grad:   23.4 mmHg MV Area VTI:   2.56 cm    TR Vmax:        242.00 cm/s MV Peak grad:  3.3 mmHg MV Mean grad:  1.0 mmHg    SHUNTS MV Vmax:       0.91 m/s    Systemic VTI:  0.27 m MV Vmean:      46.9 cm/s   Systemic Diam: 2.00 cm MV Decel Time: 289 msec MV E velocity: 97.30 cm/s MV A velocity: 87.80 cm/s MV E/A ratio:  1.11 Marcina Millard MD Electronically signed by Marcina Millard MD Signature Date/Time: 01/10/2023/1:43:11 PM    Final      ECHO as above  TELEMETRY reviewed by me 01/12/23: sinus rhythm PACs rate 70s  EKG reviewed by me 01/12/23: sinus rhythm, bifascicular block rate 75 bpm  DATA reviewed by me 01/12/23: last 24h vitals tele labs imaging I/O, hospitalist progress note, GI notes  Principal Problem:   Severe anemia Active Problems:   Atrial fibrillation, chronic (HCC)   End stage renal disease (HCC)   Leukocytosis   Hyperkalemia   Coronary artery disease involving coronary bypass graft of native heart   Moderate protein-calorie malnutrition (HCC)   NSTEMI (non-ST elevated myocardial infarction) (HCC)   (HFpEF) heart failure with preserved ejection fraction (HCC)   Hemorrhagic shock (HCC)   ABLA (acute blood loss anemia)   Upper GI  bleed   Demand ischemia of myocardium (HCC)   Gastrointestinal hemorrhage    ASSESSMENT AND PLAN: Anthony Hess is a 67 y.o. male with a past medical history of coronary artery disease s/p CABG x4 in 2020, chronic HFrEF, hx PE on eliquis, hypertension, hyperlipidemia who presented to the ED on 01/08/2023 for shortness of breath and leg pain. Found to be anemic with Hgb 4.2. Troponins elevated. Cardiology consulted for further evaluation.   # Symptomatic anemia # Bleeding duodenal ulcer Patient presented with SOB, found to be severely anemic with Hgb 4.2. S/p 3 units PRBCs. EGD yesterday with bleeding duodenal ulcer s/p cauterization. Hgb stable this AM at 8.4. -Continue to monitor H+H closely.  -Agree with holding eliquis at this time. Plan to resume after LHC.  # NSTEMI # Coronary artery disease s/p CABG x4 2020 Patient with hx of CAD s/p CABG, presented with CP in the setting of severe anemia. Troponins 3803 > 3348 > 9484 > 16108 > 16416. No recurrence of CP since receiving blood transfusion. Echo this admission with low normal EF 50-55%, no wall motion abnormalities.  -Defer heparin given severe anemia. Per GI if patient requires stent, ok to give Plavix. -Discussed the risks and benefits of proceeding with LHC for further evaluation with the patient.  He is agreeable to proceed.  NPO until LHC this afternoon (11/20) with Dr. Darrold Junker.  Written consent will be obtained.  Further recommendations following LHC.   -Continue home aspirin 81 mg daily and atorvastatin 20 mg daily.   # ESRD on HD Patient with ESRD on TTS HD.  -Management per nephrology.  # Hx PE # ? History of atrial fibrillation Noted hx of PE from 03/2022 and taking eliquis 5mg  BID for this. He carries a diagnosis of AF in his chart, but I have not seen EKG or tele  evidence of AF during this admission or during prior admissions when our service has been consulted. Continue tele monitoring while inpatient and ?outpatient  ambulatory monitoring if indicated.   This patient's case was discussed and created with Dr. Darrold Junker and he is in agreement.  Signed:  Gale Journey, PA-C  01/12/2023, 8:59 AM [facility] Cardiology

## 2023-01-12 NOTE — Progress Notes (Signed)
Progress Note   Patient: Anthony Hess WGN:[id number] DOB: [birth year] DOA: 01/08/2023     4 DOS: the patient was seen and examined on 01/12/2023   Brief hospital course: Anthony Hess is a 67 y.o. male with medical history significant of history of hypertension, atrial fibrillation on Eliquis, ESRD on hemodialysis TTS through right tunneled IJ catheter, COPD, peripheral vascular disease Recent discharge for limb ischemia status post angiogram with angioplasty and stent placement 12/23/2022 presented to the ED complaining of shortness of breath.  Patient has been having worsening shortness of breath, weak, dizzy since last 2 days.  Missed hemodialysis Saturday due to severe shortness of breath.  Patient noted  dark stools and has been not taking Eliquis for 2 days.  Patient was last seen in the emergency department 2 days ago for similar complaints.  In the emergency department patient was noted to have very low hemoglobin of 4 while last hemoglobin greater than 12, 2 days ago.  Patient's troponin greater than 3000, white count elevated, potassium 6.3.  His blood pressure noted to be very low, started on pressors and admitted to ICU with urgent hemodialysis set up by nephrology.  01/09/2023-patient is seen by GI after cardiology clearance.  He had endoscopy procedure which showed gastritis, 2 cm hiatal hernia, oozing duodenal ulcer, cauterized.   01/10/23 - Cardiology advised heart cath given elevated troponin, hold heparin given GI bleed. Echo performed.  01/11/23-he got hemodialysis today.  Heart cath scheduled for tomorrow.  No active bleeding.  Hemoglobin 8.4 stable. Transfer to PCU ordered  11/20 status post cath showing diffuse disease.  Aggressive medical management -started isosorbide.  Plan on starting Eliquis tomorrow   Assessment and Plan: Symptomatic anemia Acute blood loss anemia. Hemorrhagic shock Upper GI bleed-due to duodenal bleeding ulcer. S/p total 3 units of blood transfusion.   Continue to hold Eliquis or heparin for prophylaxis. Hold home antihypertensive medications given low BP. PCCM signed off. BP improved, he is off pressors. S/p GI evaluation, status post EGD which showed gastritis, bleeding duodenal ulcer which is cauterized 01/09/23.   Hemoglobin stable around 9. Monitor H&H and transfuse if hemoglobin less than 8.  NSTEMI versus demand ischemia H/o CAD status post CABG X 4 Possible demand ischemia from severe anemia but he does have history of CAD and high risk for MI. Echo - normal EF 50-55%, no wall motion abnormalities  status post cath showing diffuse disease.  Aggressive medical management -started isosorbide.  Plan on starting Eliquis tomorrow   Lactic acidosis:  Due to hemorrhagic shock. resolved   ESRD on HD Secondary hyperparathyroidism Hyperkalemia- Got urgent dialysis upon presentation. Nephrology follow-up appreciated. Hemodialysis as per TTS schedule. Potassium improved.  Calcium 7.2.   COPD Duonebs as needed. Supplemental o2. Not in exacerbation.   ?  History of atrial fibrillation: No record of atrial fibrillation as per cardiology note. Monitor on telemetry. Hold Eliquis given significant GI bleed. Resume tomorrow  Peripheral vascular disease: Recent discharge for limb ischemia status post angiogram with angioplasty and stent placement 12/23/2022. Currently Eliquis on hold due to significant GI bleed.      Out of bed to chair. Incentive spirometry. Nursing supportive care. Fall, aspiration precautions. DVT prophylaxis   Code Status: Full Code  Subjective: No new complaints. Waiting for cath  Physical Exam: Vitals:   01/12/23 1433 01/12/23 1445 01/12/23 1500 01/12/23 1555  BP: (!) 151/86 (!) 141/85 (!) 145/94 113/74  Pulse: (!) 58 (!) 58 60 79  Resp: 14 14 14  20  Temp:    (!) 97.5 F (36.4 C)  TempSrc:      SpO2: 95% 96% 98% 98%  Weight:      Height:        General - Elderly African-American male, no  respiratory distress HEENT - PERRLA, EOMI, atraumatic head, pale conjunctiva Lung - Clear, diffuse rales, rhonchi, no wheezes. Heart - S1, S2 heard, no murmurs, rubs, trace pedal edema. Abdomen  -soft, non tender, non distended. Neuro - Alert, awake and oriented x 3, non focal exam. Skin -warm and dry.  Data Reviewed:      Latest Ref Rng & Units 01/12/2023    5:39 AM 01/11/2023    2:28 PM 01/11/2023    5:23 AM  CBC  WBC 4.0 - 10.5 K/uL 15.5  13.7  13.5   Hemoglobin 13.0 - 17.0 g/dL 9.1  9.6  8.4   Hematocrit 39.0 - 52.0 % 28.3  29.4  25.9   Platelets 150 - 400 K/uL 366  360  342       Latest Ref Rng & Units 01/12/2023    5:39 AM 01/11/2023    2:28 PM 01/11/2023    5:23 AM  BMP  Glucose 70 - 99 mg/dL 865  784  93   BUN 8 - 23 mg/dL 58  37  96   Creatinine 0.61 - 1.24 mg/dL 6.96  2.95  2.84   Sodium 135 - 145 mmol/L 140  135  139   Potassium 3.5 - 5.1 mmol/L 4.3  3.9  4.1   Chloride 98 - 111 mmol/L 105  97  100   CO2 22 - 32 mmol/L 25  27  22    Calcium 8.9 - 10.3 mg/dL 6.9  7.2  6.3    CARDIAC CATHETERIZATION  Result Date: 01/12/2023   Ost Cx to Prox Cx lesion is 100% stenosed.   Ost LAD to Prox LAD lesion is 80% stenosed.   Prox RCA lesion is 100% stenosed.   1st Mrg lesion is 70% stenosed.   There is mild left ventricular systolic dysfunction.   The left ventricular ejection fraction is 45-50% by visual estimate. 1.  Severe three-vessel coronary artery disease with 80% stenosis ostial LAD, 100% stenosis ostial left circumflex, 100% stenosis proximal RCA 2.  Patent LIMA to LAD, patent RIMA to PDA, patent right radial to OM1 with 60-70% stenosis after anastomotic site 3.  Preserved left ventricular function with estimated LV ejection fraction 45% 4.  No definitive culprit lesion Recommendations 1.  Maximize antianginal therapy 2.  Resume Eliquis in a.m. 3.  If patient tolerates Eliquis without recurrent GI bleed and will consider adding clopidogrel 4.  Potential discharge home  on 01/13/2023     Family Communication: Discussed with patient, wife over phone. They understand and agree. All questions answereed.  Disposition: Status is: Inpatient Remains inpatient appropriate because: Aggressive medical mgmt and titration of cardiac meds  Planned Discharge Destination: Home with Home Health tomorrow     MDM level 3- Patient has elevated troponin, got 3 units of blood transfusion, needs close monitoring as he is is critically ill at risk for clinical deterioration.    Author: Delfino Lovett, MD 01/12/2023 5:21 PM Secure chat 7am to 7pm For on call review [email or web address].

## 2023-01-12 NOTE — Care Management Important Message (Signed)
Important Message  Patient Details  Name: Anthony Hess MRN: [id number] Date of Birth: 1955/11/14   Important Message Given:  Yes - Medicare IM     Verita Schneiders Kelli Robeck 01/12/2023, 2:49 PM

## 2023-01-13 ENCOUNTER — Encounter: Payer: Self-pay | Admitting: Cardiology

## 2023-01-13 DIAGNOSIS — N186 End stage renal disease: Secondary | ICD-10-CM | POA: Diagnosis not present

## 2023-01-13 DIAGNOSIS — D649 Anemia, unspecified: Secondary | ICD-10-CM | POA: Diagnosis not present

## 2023-01-13 DIAGNOSIS — K922 Gastrointestinal hemorrhage, unspecified: Secondary | ICD-10-CM | POA: Diagnosis not present

## 2023-01-13 DIAGNOSIS — I2489 Other forms of acute ischemic heart disease: Secondary | ICD-10-CM | POA: Diagnosis not present

## 2023-01-13 LAB — CBC
HCT: 25.9 % — ABNORMAL LOW (ref 39.0–52.0)
Hemoglobin: 8.3 g/dL — ABNORMAL LOW (ref 13.0–17.0)
MCH: 29.3 pg (ref 26.0–34.0)
MCHC: 32 g/dL (ref 30.0–36.0)
MCV: 91.5 fL (ref 80.0–100.0)
Platelets: 384 10*3/uL (ref 150–400)
RBC: 2.83 MIL/uL — ABNORMAL LOW (ref 4.22–5.81)
RDW: 17.8 % — ABNORMAL HIGH (ref 11.5–15.5)
WBC: 14.5 10*3/uL — ABNORMAL HIGH (ref 4.0–10.5)
nRBC: 0 % (ref 0.0–0.2)

## 2023-01-13 LAB — BASIC METABOLIC PANEL
Anion gap: 15 (ref 5–15)
BUN: 78 mg/dL — ABNORMAL HIGH (ref 8–23)
CO2: 21 mmol/L — ABNORMAL LOW (ref 22–32)
Calcium: 6.7 mg/dL — ABNORMAL LOW (ref 8.9–10.3)
Chloride: 100 mmol/L (ref 98–111)
Creatinine, Ser: 7.03 mg/dL — ABNORMAL HIGH (ref 0.61–1.24)
GFR, Estimated: 8 mL/min — ABNORMAL LOW (ref >60)
Glucose, Bld: 138 mg/dL — ABNORMAL HIGH (ref 70–99)
Potassium: 4.4 mmol/L (ref 3.5–5.1)
Sodium: 136 mmol/L (ref 135–145)

## 2023-01-13 LAB — CULTURE, BLOOD (ROUTINE X 2)
Culture: NO GROWTH
Culture: NO GROWTH
Special Requests: ADEQUATE

## 2023-01-13 LAB — LACTIC ACID, PLASMA: Lactic Acid, Venous: 0.9 mmol/L (ref 0.5–1.9)

## 2023-01-13 MED ORDER — IOHEXOL 300 MG/ML  SOLN
INTRAMUSCULAR | Status: DC | PRN
  Administered 2023-01-12: 141 mL

## 2023-01-13 MED ORDER — APIXABAN 2.5 MG PO TABS: 2.5000 mg | ORAL_TABLET | Freq: Two times a day (BID) | ORAL | 0 refills | Status: AC

## 2023-01-13 MED ORDER — PANTOPRAZOLE SODIUM 40 MG PO TBEC: 40.0000 mg | DELAYED_RELEASE_TABLET | Freq: Two times a day (BID) | ORAL | 0 refills | Status: AC

## 2023-01-13 MED ORDER — HEPARIN SODIUM (PORCINE) 1000 UNIT/ML IJ SOLN
1000.0000 [IU] | INTRAMUSCULAR | Status: DC | PRN
  Administered 2023-01-13: 1000 [IU]

## 2023-01-13 MED ORDER — ISOSORBIDE DINITRATE 10 MG PO TABS: 10.0000 mg | ORAL_TABLET | Freq: Three times a day (TID) | ORAL | 0 refills | Status: AC

## 2023-01-13 MED ORDER — EPOETIN ALFA 4000 UNIT/ML IJ SOLN
INTRAMUSCULAR | Status: AC
  Filled 2023-01-13: qty 1

## 2023-01-13 NOTE — Progress Notes (Signed)
PT Cancellation Note  Patient Details Name: Jamicheal Glunz MRN: [id number] DOB: [birth year]   Cancelled Treatment:    Reason Eval/Treat Not Completed: Patient at procedure or test/unavailable.  Pt currently not in his room.  Will re-attempt PT session at a later date/time.  Hendricks Limes, PT 01/13/23, 2:04 PM

## 2023-01-13 NOTE — Plan of Care (Signed)

## 2023-01-13 NOTE — Plan of Care (Signed)

## 2023-01-13 NOTE — Discharge Summary (Signed)
Physician Discharge Summary   Patient: Anthony Hess MRN: [id number] DOB: December 17, 1955  Admit date:     01/08/2023  Discharge date: 01/13/23  Discharge Physician: Delfino Lovett   PCP: Ronnald Ramp, MD   Recommendations at discharge:    F/up with outpt providers as requested  Discharge Diagnoses: Principal Problem:   Severe anemia Active Problems:   NSTEMI (non-ST elevated myocardial infarction) (HCC)   Hyperkalemia   (HFpEF) heart failure with preserved ejection fraction (HCC)   Atrial fibrillation, chronic (HCC)   Leukocytosis   End stage renal disease (HCC)   Coronary artery disease involving coronary bypass graft of native heart   Moderate protein-calorie malnutrition (HCC)   Hemorrhagic shock (HCC)   ABLA (acute blood loss anemia)   Upper GI bleed   Demand ischemia of myocardium [facility])   Gastrointestinal hemorrhage  Hospital Course: Assessment and Plan:  Anthony Hess is a 67 y.o. male with medical history significant of history of hypertension, atrial fibrillation on Eliquis, ESRD on hemodialysis TTS through right tunneled IJ catheter, COPD, peripheral vascular disease Recent discharge for limb ischemia status post angiogram with angioplasty and stent placement 12/23/2022 presented to the ED complaining of shortness of breath.  Patient has been having worsening shortness of breath, weak, dizzy since last 2 days.  Missed hemodialysis Saturday due to severe shortness of breath.  Patient noted  dark stools and has been not taking Eliquis for 2 days.  Patient was last seen in the emergency department 2 days ago for similar complaints.  In the emergency department patient was noted to have very low hemoglobin of 4 while last hemoglobin greater than 12, 2 days ago.  Patient's troponin greater than 3000, white count elevated, potassium 6.3.  His blood pressure noted to be very low, started on pressors and admitted to ICU with urgent hemodialysis set up by nephrology.    01/09/2023-patient is seen by GI after cardiology clearance.  He had endoscopy procedure which showed gastritis, 2 cm hiatal hernia, oozing duodenal ulcer, cauterized.    01/10/23 - Cardiology advised heart cath given elevated troponin, hold heparin given GI bleed. Echo performed.   01/11/23-he got hemodialysis today.  Heart cath scheduled for tomorrow.  No active bleeding.  Hemoglobin 8.4 stable. Transfer to PCU ordered   11/20 status post cath showing diffuse disease.  Aggressive medical management -started isosorbide.  Plan on starting Eliquis tomorrow    Assessment and Plan: Symptomatic anemia Acute blood loss anemia. Hemorrhagic shock Upper GI bleed-due to duodenal bleeding ulcer. S/p total 3 units of blood transfusion.  Held Eliquis while in the Hospital S/p GI evaluation, status post EGD which showed gastritis, bleeding duodenal ulcer which is cauterized 01/09/23.   Hemoglobin stable since then. No further bleed   NSTEMI  H/o CAD status post CABG X 4 Possible demand ischemia from severe anemia but he does have history of CAD and high risk for MI. Echo - normal EF 50-55%, no wall motion abnormalities  status post cath showing diffuse disease.  Aggressive medical management -started isosorbide and eliquis at DC   Lactic acidosis:  Due to hemorrhagic shock. resolved   ESRD on HD Secondary hyperparathyroidism Hyperkalemia- Got urgent dialysis upon presentation. Nephrology follow-up appreciated. Hemodialysis as per TTS schedule. Potassium normalized with HD   COPD stable   # Hx PE A.fib ruled out Noted hx of PE from 03/2022 and taking eliquis 5mg  BID for this. He carries a diagnosis of AF in his chart, but no EKG or tele evidence of  AF during this admission or during prior admissions per cardio.  -Restart eliquis 2.5 mg twice daily at DC.   Peripheral vascular disease: Recent discharge for limb ischemia status post angiogram with angioplasty and stent placement  12/23/2022. Continue Eliquis at DC           Consultants: Nephro, Cardio Procedures performed: Cath on 11/20 showing diffuse multivessel disease but patent grafts   Disposition: Home Diet recommendation:  Discharge Diet Orders (From admission, onward)     Start     Ordered   01/13/23 0000  Diet - low sodium heart healthy        01/13/23 1443           Carb modified diet DISCHARGE MEDICATION: Allergies as of 01/13/2023   No Known Allergies      Medication List     STOP taking these medications    calcium acetate 667 MG capsule Commonly known as: PHOSLO       TAKE these medications    apixaban 2.5 MG Tabs tablet Commonly known as: ELIQUIS Take 1 tablet (2.5 mg total) by mouth 2 (two) times daily. What changed:  medication strength how much to take   aspirin EC 81 MG tablet Take 81 mg by mouth daily.   atorvastatin 20 MG tablet Commonly known as: LIPITOR Take 1 tablet (20 mg total) by mouth daily.   isosorbide dinitrate 10 MG tablet Commonly known as: ISORDIL Take 1 tablet (10 mg total) by mouth 3 (three) times daily.   oxyCODONE-acetaminophen 5-325 MG tablet Commonly known as: PERCOCET/ROXICET Take 1-2 tablets by mouth every 6 (six) hours as needed for severe pain (pain score 7-10).   pantoprazole 40 MG tablet Commonly known as: Protonix Take 1 tablet (40 mg total) by mouth 2 (two) times daily before a meal. What changed: when to take this   sevelamer carbonate 800 MG tablet Commonly known as: RENVELA Take 1,600 mg by mouth 3 (three) times daily.        Follow-up Information     Paraschos, Alexander, MD. Go in 1 week(s).   Specialty: Cardiology Contact information: [address] [facility] [place] [address] [phone]         Ronnald Ramp, MD. Schedule an appointment as soon as possible for a visit in 1 week(s).   Specialty: Family Medicine Why: [facility] Discharge  F/UP Contact information: [address] [place] [address] [phone]         Stanton Kidney, MD. Schedule an appointment as soon as possible for a visit in 2 week(s).   Specialty: Gastroenterology Why: [facility] Discharge F/UP Contact information: [address] [place] [address] [phone]                Discharge Exam: [name] Weights   01/11/23 0902 01/11/23 1300 01/13/23 1333  Weight: 54 kg 52 kg 52.2 kg   General - Elderly African-American male, no respiratory distress HEENT - PERRLA, EOMI, atraumatic head, pale conjunctiva Lung - CTA b/l Heart - S1, S2 heard, no murmurs, rubs, trace pedal edema. Abdomen  -soft, non tender, non distended. Neuro - Alert, awake and oriented x 3, non focal exam. Skin -warm and dry.  Condition at discharge: good  The results of significant diagnostics from this hospitalization (including imaging, microbiology, ancillary and laboratory) are listed below for reference.   Imaging Studies: CARDIAC CATHETERIZATION  Result Date: 01/12/2023   Suezanne Jacquet Cx to Prox Cx lesion is 100% stenosed.  Ost LAD to Prox LAD lesion is 80% stenosed.   Prox RCA lesion is 100% stenosed.   1st Mrg lesion is 70% stenosed.   There is mild left ventricular systolic dysfunction.   The left ventricular ejection fraction is 45-50% by visual estimate. 1.  Severe three-vessel coronary artery disease with 80% stenosis ostial LAD, 100% stenosis ostial left circumflex, 100% stenosis proximal RCA 2.  Patent LIMA to LAD, patent RIMA to PDA, patent right radial to OM1 with 60-70% stenosis after anastomotic site 3.  Preserved left ventricular function with estimated LV ejection fraction 45% 4.  No definitive culprit lesion Recommendations 1.  Maximize antianginal therapy 2.  Resume Eliquis in a.m. 3.  If patient tolerates Eliquis without recurrent GI bleed and will consider adding clopidogrel 4.  Potential discharge home on 01/13/2023    ECHOCARDIOGRAM COMPLETE  Result Date: 01/10/2023    ECHOCARDIOGRAM REPORT   Patient Name:   GRAYLON AMIE Date of Exam: 01/10/2023 Medical Rec #:  [id number]    Height:       62.0 in Accession #:    [id number]   Weight:       117.1 lb Date of Birth:  02/22/1956   BSA:          1.523 m Patient Age:    66 years     BP:           141/94 mmHg Patient Gender: M            HR:           68 bpm. Exam Location:  [facility] Procedure: 2D Echo, Cardiac Doppler and Color Doppler Indications:     NSTEMI  History:         Patient has prior history of Echocardiogram examinations, most                  recent 09/15/2022. CHF, Acute MI, CAD and Previous Myocardial                  Infarction, Prior CABG, COPD, Arrythmias:Atrial Fibrillation,                  Signs/Symptoms:Dyspnea; Risk Factors:Hypertension, Dyslipidemia                  and Current Smoker. AAA, Pulmonary embolus, ESRD on dialysis.  Sonographer:     Mikki Harbor Referring Phys:  [id number] CARALYN HUDSON Diagnosing Phys: Marcina Millard MD IMPRESSIONS  1. Left ventricular ejection fraction, by estimation, is 50 to 55%. The left ventricle has low normal function. The left ventricle has no regional wall motion abnormalities. There is moderate left ventricular hypertrophy. Left ventricular diastolic parameters were normal.  2. Right ventricular systolic function is normal. The right ventricular size is normal. There is normal pulmonary artery systolic pressure.  3. The mitral valve is normal in structure. Mild mitral valve regurgitation. No evidence of mitral stenosis.  4. The aortic valve is normal in structure. Aortic valve regurgitation is not visualized. Mild aortic valve stenosis.  5. The inferior vena cava is normal in size with greater than 50% respiratory variability, suggesting right atrial pressure of 3 mmHg. FINDINGS  Left Ventricle: Left ventricular ejection fraction, by estimation, is 50 to 55%. The left ventricle has low normal function. The left  ventricle has no regional wall motion abnormalities. The left ventricular internal cavity size was normal in size. There is moderate left ventricular hypertrophy. Left ventricular diastolic parameters were normal. Right Ventricle: The right  ventricular size is normal. No increase in right ventricular wall thickness. Right ventricular systolic function is normal. There is normal pulmonary artery systolic pressure. The tricuspid regurgitant velocity is 2.42 m/s, and  with an assumed right atrial pressure of 3 mmHg, the estimated right ventricular systolic pressure is 26.4 mmHg. Left Atrium: Left atrial size was normal in size. Right Atrium: Right atrial size was normal in size. Pericardium: There is no evidence of pericardial effusion. Mitral Valve: The mitral valve is normal in structure. Mild mitral valve regurgitation. No evidence of mitral valve stenosis. MV peak gradient, 3.3 mmHg. The mean mitral valve gradient is 1.0 mmHg. Tricuspid Valve: The tricuspid valve is normal in structure. Tricuspid valve regurgitation is mild . No evidence of tricuspid stenosis. Aortic Valve: The aortic valve is normal in structure. Aortic valve regurgitation is not visualized. Mild aortic stenosis is present. Aortic valve mean gradient measures 9.0 mmHg. Aortic valve peak gradient measures 15.4 mmHg. Aortic valve area, by VTI measures 2.21 cm. Pulmonic Valve: The pulmonic valve was normal in structure. Pulmonic valve regurgitation is not visualized. No evidence of pulmonic stenosis. Aorta: The aortic root is normal in size and structure. Venous: The inferior vena cava is normal in size with greater than 50% respiratory variability, suggesting right atrial pressure of 3 mmHg. IAS/Shunts: No atrial level shunt detected by color flow Doppler.  LEFT VENTRICLE PLAX 2D LVIDd:         4.60 cm      Diastology LVIDs:         3.40 cm      LV e' medial:    6.31 cm/s LV PW:         1.40 cm      LV E/e' medial:  15.4 LV IVS:        1.40 cm       LV e' lateral:   14.00 cm/s LVOT diam:     2.00 cm      LV E/e' lateral: 7.0 LV SV:         86 LV SV Index:   57 LVOT Area:     3.14 cm  LV Volumes (MOD) LV vol d, MOD A2C: 93.8 ml LV vol d, MOD A4C: 107.0 ml LV vol s, MOD A2C: 52.7 ml LV vol s, MOD A4C: 51.1 ml LV SV MOD A2C:     41.1 ml LV SV MOD A4C:     107.0 ml LV SV MOD BP:      47.6 ml RIGHT VENTRICLE RV Basal diam:  3.90 cm RV Mid diam:    4.40 cm RV S prime:     9.68 cm/s LEFT ATRIUM             Index        RIGHT ATRIUM           Index LA diam:        4.40 cm 2.89 cm/m   RA Area:     17.90 cm LA Vol (A2C):   79.6 ml 52.28 ml/m  RA Volume:   45.10 ml  29.62 ml/m LA Vol (A4C):   57.5 ml 37.77 ml/m LA Biplane Vol: 67.7 ml 44.47 ml/m  AORTIC VALVE                     PULMONIC VALVE AV Area (Vmax):    2.12 cm      PV Vmax:       1.36 m/s AV Area (Vmean):  2.03 cm      PV Peak grad:  7.4 mmHg AV Area (VTI):     2.21 cm AV Vmax:           196.00 cm/s AV Vmean:          143.000 cm/s AV VTI:            0.389 m AV Peak Grad:      15.4 mmHg AV Mean Grad:      9.0 mmHg LVOT Vmax:         132.00 cm/s LVOT Vmean:        92.400 cm/s LVOT VTI:          0.274 m LVOT/AV VTI ratio: 0.70  AORTA Ao Root diam: 3.50 cm MITRAL VALVE               TRICUSPID VALVE MV Area (PHT): 2.62 cm    TR Peak grad:   23.4 mmHg MV Area VTI:   2.56 cm    TR Vmax:        242.00 cm/s MV Peak grad:  3.3 mmHg MV Mean grad:  1.0 mmHg    SHUNTS MV Vmax:       0.91 m/s    Systemic VTI:  0.27 m MV Vmean:      46.9 cm/s   Systemic Diam: 2.00 cm MV Decel Time: 289 msec MV E velocity: 97.30 cm/s MV A velocity: 87.80 cm/s MV E/A ratio:  1.11 Marcina Millard MD Electronically signed by Marcina Millard MD Signature Date/Time: 01/10/2023/1:43:11 PM    Final    CT Angio Chest PE W/Cm &/Or Wo Cm  Result Date: 01/08/2023 CLINICAL DATA:  Acute shortness of breath, wheezing, and hypoxia. Previous pulmonary embolism. EXAM: CT ANGIOGRAPHY CHEST WITH CONTRAST TECHNIQUE: Multidetector CT  imaging of the chest was performed using the standard protocol during bolus administration of intravenous contrast. Multiplanar CT image reconstructions and MIPs were obtained to evaluate the vascular anatomy. RADIATION DOSE REDUCTION: This exam was performed according to the departmental dose-optimization program which includes automated exposure control, adjustment of the mA and/or kV according to patient size and/or use of iterative reconstruction technique. CONTRAST:  75mL OMNIPAQUE IOHEXOL 350 MG/ML SOLN COMPARISON:  10/14/2022 FINDINGS: Cardiovascular: Satisfactory opacification of pulmonary arteries noted. A single tiny nonocclusive embolus is seen in a subsegmental left lower lobe pulmonary artery branch (image 278/6) which is unchanged since previous study. No acute pulmonary embolism identified. No evidence of thoracic aortic dissection or aneurysm. Mediastinum/Nodes: No masses or pathologically enlarged lymph nodes identified. Lungs/Pleura: Moderate centrilobular emphysema again noted. No pulmonary mass, infiltrate, or effusion. Upper abdomen: No acute findings. Musculoskeletal: No suspicious bone lesions identified. Review of the MIP images confirms the above findings. IMPRESSION: No evidence of acute pulmonary embolism or other acute findings. 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 Emphysema (ICD10-J43.9). Electronically Signed   By: Danae Orleans M.D.   On: 01/08/2023 17:42   CT ABDOMEN PELVIS W CONTRAST  Result Date: 01/08/2023 CLINICAL DATA:  Acute abdominal pain. EXAM: CT ABDOMEN AND PELVIS WITH CONTRAST TECHNIQUE: Multidetector CT imaging of the abdomen and pelvis was performed using the standard protocol following bolus administration of intravenous contrast. RADIATION DOSE REDUCTION: This exam was performed according to the departmental dose-optimization program which includes automated exposure control,  adjustment of the mA and/or kV according to patient size and/or use of iterative reconstruction technique. CONTRAST:  75mL OMNIPAQUE IOHEXOL 350 MG/ML SOLN  COMPARISON:  09/03/2022 FINDINGS: Lower Chest: No acute findings. Hepatobiliary: Several previously demonstrated benign hemangiomas and cysts show no significant change since prior study. Probable tiny gallstones seen, however, there is no No evidence of cholecystitis or biliary ductal dilatation. Pancreas:  No mass or inflammatory changes. Spleen: Within normal limits in size and appearance. Adrenals/Urinary Tract: Severe bilateral diffuse renal parenchymal atrophy again seen, consistent with end-stage renal disease. No suspicious masses identified. No evidence of ureteral calculi or hydronephrosis. Diffuse bladder wall thickening again noted, which may be due to chronic bladder outlet obstruction or cystitis. Stomach/Bowel: No evidence of obstruction, inflammatory process or abnormal fluid collections. Vascular/Lymphatic: No pathologically enlarged lymph nodes. No acute vascular findings. Reproductive:  No mass or other significant abnormality. Other:  None. Musculoskeletal:  No suspicious bone lesions identified. IMPRESSION: No acute findings. Stable diffuse bladder wall thickening, which may be due to chronic bladder outlet obstruction or cystitis. Recommend correlation with urinalysis. Probable tiny gallstones. No radiographic evidence of cholecystitis. Chronic severe bilateral renal atrophy, consistent with end-stage renal disease. No evidence of hydronephrosis. Electronically Signed   By: Danae Orleans M.D.   On: 01/08/2023 17:26   DG Chest 2 View  Result Date: 01/06/2023 CLINICAL DATA:  Shortness of breath.  Right leg pain. EXAM: CHEST - 2 VIEW COMPARISON:  11/16/2022. FINDINGS: Unchanged cardiac silhouette and mediastinal contours post median sternotomy and CABG. Atherosclerotic plaque within thoracic aorta. Stable positioning of support apparatus.  No pneumothorax. No evidence of edema. No acute osseous abnormalities. Moderate scoliotic curvature of the thoracolumbar spine, incompletely evaluated. IMPRESSION: No acute cardiopulmonary disease. Electronically Signed   By: Simonne Come M.D.   On: 01/06/2023 12:25   US Venous Img Lower Unilateral Right  Result Date: 01/06/2023 CLINICAL DATA:  Right lower extremity pain.  Evaluate for DVT. EXAM: RIGHT LOWER EXTREMITY VENOUS DOPPLER ULTRASOUND TECHNIQUE: Gray-scale sonography with graded compression, as well as color Doppler and duplex ultrasound were performed to evaluate the lower extremity deep venous systems from the level of the common femoral vein and including the common femoral, femoral, profunda femoral, popliteal and calf veins including the posterior tibial, peroneal and gastrocnemius veins when visible. The superficial great saphenous vein was also interrogated. Spectral Doppler was utilized to evaluate flow at rest and with distal augmentation maneuvers in the common femoral, femoral and popliteal veins. COMPARISON:  None Available. FINDINGS: Contralateral Common Femoral Vein: Respiratory phasicity is normal and symmetric with the symptomatic side. No evidence of thrombus. Normal compressibility. Common Femoral Vein: No evidence of thrombus. Normal compressibility, respiratory phasicity and response to augmentation. Saphenofemoral Junction: No evidence of thrombus. Normal compressibility and flow on color Doppler imaging. Profunda Femoral Vein: No evidence of thrombus. Normal compressibility and flow on color Doppler imaging. Femoral Vein: No evidence of thrombus. Normal compressibility, respiratory phasicity and response to augmentation. Popliteal Vein: No evidence of thrombus. Normal compressibility, respiratory phasicity and response to augmentation. Calf Veins: No evidence of thrombus. Normal compressibility and flow on color Doppler imaging. Superficial Great Saphenous Vein: No evidence of  thrombus. Normal compressibility. Other Findings: An arterial stent is incidentally noted within the right superficial femoral artery, the patency of which is not evaluated on this examination. IMPRESSION: No evidence of DVT within the right lower extremity. Electronically Signed   By: Simonne Come M.D.   On: 01/06/2023 12:16   PERIPHERAL VASCULAR CATHETERIZATION  Result Date: 12/23/2022 See surgical note for result.  VAS [place] LOWER EXTREMITY ARTERIAL DUPLEX  Result Date: 12/20/2022 LOWER EXTREMITY ARTERIAL DUPLEX STUDY  Patient Name:  YOHANAN FERRONI  Date of Exam:   12/15/2022 Medical Rec #: [id number]     Accession #:    [id number] Date of Birth: Sep 09, 1955    Patient Gender: M Patient Age:   34 years Exam Location:  [facility] Procedure:      VAS [place] LOWER EXTREMITY ARTERIAL DUPLEX Referring Phys: [facility] --------------------------------------------------------------------------------  Indications: Rest pain.  Current ABI: Rt 0.0, Lt 1.01 Performing Technologist: Debbe Bales RVS  Examination Guidelines: A complete evaluation includes B-mode imaging, spectral Doppler, color Doppler, and power Doppler as needed of all accessible portions of each vessel. Bilateral testing is considered an integral part of a complete examination. Limited examinations for reoccurring indications may be performed as noted.  +-----------+--------+-----+--------+-------------+--------+ RIGHT      PSV cm/sRatioStenosisWaveform     Comments +-----------+--------+-----+--------+-------------+--------+ CFA Distal 60                   triphasic             +-----------+--------+-----+--------+-------------+--------+ DFA        45                   triphasic             +-----------+--------+-----+--------+-------------+--------+ SFA Prox   0                    Occluded              +-----------+--------+-----+--------+-------------+--------+ SFA Mid    0                    Occluded               +-----------+--------+-----+--------+-------------+--------+ SFA Distal 0                    Occluded              +-----------+--------+-----+--------+-------------+--------+ POP Distal 14                   monophasic            +-----------+--------+-----+--------+-------------+--------+ ATA Distal 10                   Non pulsatile         +-----------+--------+-----+--------+-------------+--------+ PTA Distal 0                    Occluded              +-----------+--------+-----+--------+-------------+--------+ PERO Distal0                    Occluded              +-----------+--------+-----+--------+-------------+--------+  +-----------+--------+-----+--------+------------------+--------+ LEFT       PSV cm/sRatioStenosisWaveform          Comments +-----------+--------+-----+--------+------------------+--------+ CFA Distal 83                   triphasic                  +-----------+--------+-----+--------+------------------+--------+ DFA        75                   triphasic                  +-----------+--------+-----+--------+------------------+--------+ SFA Prox   138  biphasic                   +-----------+--------+-----+--------+------------------+--------+ SFA Mid    53                   biphasic                   +-----------+--------+-----+--------+------------------+--------+ SFA Distal 40                   biphasic                   +-----------+--------+-----+--------+------------------+--------+ POP Distal 21                   monophasic                 +-----------+--------+-----+--------+------------------+--------+ ATA Distal 7                    Blunted Monophasic         +-----------+--------+-----+--------+------------------+--------+ PTA Distal 26                   monophasic                 +-----------+--------+-----+--------+------------------+--------+ PERO  Distal18                   monophasic                 +-----------+--------+-----+--------+------------------+--------+  Summary: Right: Imaging and Waveforms obtained throughout in the Right Lower Extremity. No flow seen in the Right SFA, PTA and Peroneal Artery. Left: Imaging and Waveforms obtained throughout in the Left Lower Extremity. Monophasic Flow seen in the Left PTA/Peroneal Artery and Blunted Waveforms seen in the ATA.  See table(s) above for measurements and observations. Electronically signed by [name] MD on 12/20/2022 at 5:09:02 PM.    Final    VAS [place] ABI WITH/WO TBI  Result Date: 12/20/2022  LOWER EXTREMITY DOPPLER STUDY Patient Name:  Elijuah Yeck  Date of Exam:   12/15/2022 Medical Rec #: [id number]     Accession #:    [id number] Date of Birth: 27-Oct-1955    Patient Gender: M Patient Age:   67 years Exam Location:  [facility] Vascluar Procedure:      VAS [place] ABI WITH/WO TBI Referring Phys: [name] --------------------------------------------------------------------------------  Indications: Rest pain.  Performing Technologist: Debbe Bales RVS  Examination Guidelines: A complete evaluation includes at minimum, Doppler waveform signals and systolic blood pressure reading at the level of bilateral brachial, anterior tibial, and posterior tibial arteries, when vessel segments are accessible. Bilateral testing is considered an integral part of a complete examination. Photoelectric Plethysmograph (PPG) waveforms and toe systolic pressure readings are included as required and additional duplex testing as needed. Limited examinations for reoccurring indications may be performed as noted.  ABI Findings: +---------+------------------+-----+--------+--------+ Right    Rt Pressure (mmHg)IndexWaveformComment  +---------+------------------+-----+--------+--------+ Brachial 90                                      +---------+------------------+-----+--------+--------+  ATA      0                 0.00 absent           +---------+------------------+-----+--------+--------+ PTA      0  0.00 absent           +---------+------------------+-----+--------+--------+ Great Toe0                 0.00 Absent           +---------+------------------+-----+--------+--------+ +---------+------------------+-----+----------+-------+ Left     Lt Pressure (mmHg)IndexWaveform  Comment +---------+------------------+-----+----------+-------+ Brachial 87                                       +---------+------------------+-----+----------+-------+ ATA      0                 0.00 absent            +---------+------------------+-----+----------+-------+ PTA      91                1.01 monophasic        +---------+------------------+-----+----------+-------+ Great Toe0                 0.00 Absent            +---------+------------------+-----+----------+-------+ +-------+-----------+-----------+------------+------------+ ABI/TBIToday's ABIToday's TBIPrevious ABIPrevious TBI +-------+-----------+-----------+------------+------------+ Right  0.0        0.0                                 +-------+-----------+-----------+------------+------------+ Left   1.01       0                                   +-------+-----------+-----------+------------+------------+  Summary: Right: Resting right ankle-brachial index indicates critical limb ischemia. The right toe-brachial index is abnormal. Left: Resting left ankle-brachial index is within normal range. The left toe-brachial index is abnormal. *See table(s) above for measurements and observations.  Electronically signed by [name] MD on 12/20/2022 at 5:08:56 PM.    Final     Microbiology: Results for orders placed or performed during the hospital encounter of 01/08/23  Culture, blood (routine x 2)     Status: None   Collection Time: 01/08/23  1:24 PM   Specimen: BLOOD RIGHT  ARM  Result Value Ref Range Status   Specimen Description BLOOD RIGHT ARM  Final   Special Requests   Final    BOTTLES DRAWN AEROBIC AND ANAEROBIC Blood Culture results may not be optimal due to an excessive volume of blood received in culture bottles   Culture   Final    NO GROWTH 5 DAYS Performed at [facility], [address], [place], [place] [address]    Report Status 01/13/2023 FINAL  Final  Culture, blood (routine x 2)     Status: None   Collection Time: 01/08/23  2:27 PM   Specimen: BLOOD  Result Value Ref Range Status   Specimen Description BLOOD BLOOD LEFT FOREARM  Final   Special Requests   Final    BOTTLES DRAWN AEROBIC AND ANAEROBIC Blood Culture adequate volume   Culture   Final    NO GROWTH 5 DAYS Performed at [facility], [address], [place], [place] [address]    Report Status 01/13/2023 FINAL  Final  MRSA Next Gen by PCR, Nasal     Status: None   Collection Time: 01/08/23  5:19 PM   Specimen: Nasal Mucosa; Nasal Swab  Result Value  Ref Range Status   MRSA by PCR Next Gen NOT DETECTED NOT DETECTED Final    Comment: (NOTE) The GeneXpert MRSA Assay (FDA approved for NASAL specimens only), is one component of a comprehensive MRSA colonization surveillance program. It is not intended to diagnose MRSA infection nor to guide or monitor treatment for MRSA infections. Test performance is not FDA approved in patients less than 38 years old. Performed at [facility], [address] [place], [place] [address]     Labs: CBC: Recent Labs  Lab 01/08/23 1324 01/08/23 1426 01/08/23 2255 01/09/23 0448 01/10/23 0400 01/11/23 0523 01/11/23 1428 01/12/23 0539 01/13/23 0343  WBC 18.4* 17.4* 17.3*   < > 11.2* 13.5* 13.7* 15.5* 14.5*  NEUTROABS 15.6* 14.9* 14.7*  --  8.2*  --   --   --   --   HGB 4.2* 4.0* 7.7*   < > 8.4* 8.4* 9.6* 9.1* 8.3*  HCT 14.3* 13.5* 22.2*   < > 24.4* 25.9* 29.4* 28.3* 25.9*  MCV 100.0 100.7* 86.7   < > 87.1  91.2 88.6 90.4 91.5  PLT 505* 519* 311   < > 314 342 360 366 384   < > = values in this interval not displayed.   Basic Metabolic Panel: Recent Labs  Lab 01/10/23 0400 01/11/23 0523 01/11/23 1428 01/12/23 0539 01/13/23 0343  NA 139 139 135 140 136  K 4.5 4.1 3.9 4.3 4.4  CL 100 100 97* 105 100  CO2 26 22 27 25  21*  GLUCOSE 84 93 123* 111* 138*  BUN 69* 96* 37* 58* 78*  CREATININE 5.84* 8.06* 4.04* 5.76* 7.03*  CALCIUM 6.8* 6.3* 7.2* 6.9* 6.7*  PHOS  --   --  2.6  --   --    Liver Function Tests: Recent Labs  Lab 01/08/23 1324 01/11/23 1428  AST 28  --   ALT 17  --   ALKPHOS 45  --   BILITOT 0.2  --   PROT 5.2*  --   ALBUMIN 2.5* 2.6*   CBG: Recent Labs  Lab 01/08/23 1726  GLUCAP 112*    Discharge time spent: greater than 30 minutes.  Signed: Delfino Lovett, MD [facility] 01/13/2023

## 2023-01-13 NOTE — Progress Notes (Signed)
Hemodialysis note  Received patient in bed to unit. Alert and oriented.  Informed consent signed and in chart.  Treatment initiated: 1000 Treatment completed: 1333  1250: Dr.Lateef was notified of patient's episodes of hypotension. Patient is asymptomatic and HD was continued.  Patient tolerated well. Transported back to room, alert without acute distress.  Report given to patient's RN.   Access used: Right Chest HD PermCath Access issues: none  Total UF removed: 900 ml (due to episodes of Hypotension) Medication(s) given:  Epogen 4000 units IV  Post HD weight: 52.2 kg   Wolfgang Phoenix [facility]

## 2023-01-13 NOTE — Progress Notes (Signed)
[facility] CLINIC CARDIOLOGY PROGRESS NOTE   Patient ID: Anthony Hess MRN: [id number] DOB/AGE: [birth year] 65 y.o.  Admit date: 01/08/2023 Referring Physician Dr. Marcelino Duster Primary Physician [name], [name], MD  Primary Cardiologist Dr. Briant Sites Reason for Consultation elevated troponin  HPI: Anthony Hess is a 67 y.o. male with a past medical history of coronary artery disease s/p CABG x4 in 2020, chronic HFrEF, hx PE on eliquis, hypertension, hyperlipidemia who presented to the ED on 01/08/2023 for shortness of breath and leg pain. Found to be anemic with Hgb 4.2. Troponins elevated. Cardiology consulted for further evaluation.   Interval History:  -Patient seen and examined this AM. States he is feeling well. Denies any CP or SOB. -Hgb stable this AM. R groin access site is nontender, without bruising or evidence of abnormality. -BP and HR controlled this AM.   Review of systems complete and found to be negative unless listed above    Vitals:   01/13/23 1230 01/13/23 1234 01/13/23 1300 01/13/23 1333  BP: (!) 82/55 91/64 109/87   Pulse: 88 (!) 29 76 89  Resp: 19 20 (!) 24 15  Temp:      TempSrc:      SpO2: 94% 98% 99% 97%  Weight:      Height:        No intake or output data in the 24 hours ending 01/13/23 1334    PHYSICAL EXAM General: chronically ill appearing male, well nourished, in no acute distress laying nearly flat in hospital bed. HEENT: Normocephalic and atraumatic. Neck: No JVD.  Lungs: Normal respiratory effort on room air. Clear bilaterally to auscultation. No wheezes, crackles, rhonchi.  Heart: HRRR. Normal S1 and S2 without gallops or murmurs. Radial & DP pulses 2+ bilaterally. Abdomen: Non-distended appearing.  Msk: Normal strength and tone for age. Extremities: No clubbing, cyanosis or edema.   Neuro: Alert and oriented X 3. Psych: Mood appropriate, affect congruent.    LABS: Basic Metabolic Panel: Recent Labs     01/11/23 1428 01/12/23 0539 01/13/23 0343  NA 135 140 136  K 3.9 4.3 4.4  CL 97* 105 100  CO2 27 25 21*  GLUCOSE 123* 111* 138*  BUN 37* 58* 78*  CREATININE 4.04* 5.76* 7.03*  CALCIUM 7.2* 6.9* 6.7*  PHOS 2.6  --   --    Liver Function Tests: Recent Labs    01/11/23 1428  ALBUMIN 2.6*   No results for input(s): "LIPASE", "AMYLASE" in the last 72 hours.  CBC: Recent Labs    01/12/23 0539 01/13/23 0343  WBC 15.5* 14.5*  HGB 9.1* 8.3*  HCT 28.3* 25.9*  MCV 90.4 91.5  PLT 366 384   Cardiac Enzymes: No results for input(s): "CKTOTAL", "CKMB", "CKMBINDEX", "TROPONINIHS" in the last 72 hours.  BNP: No results for input(s): "BNP" in the last 72 hours. D-Dimer: No results for input(s): "DDIMER" in the last 72 hours. Hemoglobin A1C: No results for input(s): "HGBA1C" in the last 72 hours. Fasting Lipid Panel: No results for input(s): "CHOL", "HDL", "LDLCALC", "TRIG", "CHOLHDL", "LDLDIRECT" in the last 72 hours. Thyroid Function Tests: No results for input(s): "TSH", "T4TOTAL", "T3FREE", "THYROIDAB" in the last 72 hours.  Invalid input(s): "FREET3" Anemia Panel: No results for input(s): "VITAMINB12", "FOLATE", "FERRITIN", "TIBC", "IRON", "RETICCTPCT" in the last 72 hours.  CARDIAC CATHETERIZATION  Result Date: 01/12/2023   Suezanne Jacquet Cx to Prox Cx lesion is 100% stenosed.   Ost LAD to Prox LAD lesion is 80% stenosed.   Prox RCA lesion is 100%  stenosed.   1st Mrg lesion is 70% stenosed.   There is mild left ventricular systolic dysfunction.   The left ventricular ejection fraction is 45-50% by visual estimate. 1.  Severe three-vessel coronary artery disease with 80% stenosis ostial LAD, 100% stenosis ostial left circumflex, 100% stenosis proximal RCA 2.  Patent LIMA to LAD, patent RIMA to PDA, patent right radial to OM1 with 60-70% stenosis after anastomotic site 3.  Preserved left ventricular function with estimated LV ejection fraction 45% 4.  No definitive culprit lesion  Recommendations 1.  Maximize antianginal therapy 2.  Resume Eliquis in a.m. 3.  If patient tolerates Eliquis without recurrent GI bleed and will consider adding clopidogrel 4.  Potential discharge home on 01/13/2023     ECHO as above  TELEMETRY reviewed by me 01/13/23: sinus rhythm rate 90s  EKG reviewed by me 01/13/23: sinus rhythm, bifascicular block rate 75 bpm  DATA reviewed by me 01/13/23: last 24h vitals tele labs imaging I/O, hospitalist progress note, GI notes  Principal Problem:   Severe anemia Active Problems:   Atrial fibrillation, chronic (HCC)   End stage renal disease (HCC)   Leukocytosis   Hyperkalemia   Coronary artery disease involving coronary bypass graft of native heart   Moderate protein-calorie malnutrition (HCC)   NSTEMI (non-ST elevated myocardial infarction) (HCC)   (HFpEF) heart failure with preserved ejection fraction (HCC)   Hemorrhagic shock (HCC)   ABLA (acute blood loss anemia)   Upper GI bleed   Demand ischemia of myocardium (HCC)   Gastrointestinal hemorrhage    ASSESSMENT AND PLAN: Anthony Hess is a 67 y.o. male with a past medical history of coronary artery disease s/p CABG x4 in 2020, chronic HFrEF, hx PE on eliquis, hypertension, hyperlipidemia who presented to the ED on 01/08/2023 for shortness of breath and leg pain. Found to be anemic with Hgb 4.2. Troponins elevated. Cardiology consulted for further evaluation.   # Symptomatic anemia # Bleeding duodenal ulcer Patient presented with SOB, found to be severely anemic with Hgb 4.2. S/p 3 units PRBCs. EGD yesterday with bleeding duodenal ulcer s/p cauterization. Hgb stable this AM at 8.3. -Continue to monitor H+H closely.   # NSTEMI # Coronary artery disease s/p CABG x4 2020 Patient with hx of CAD s/p CABG, presented with CP in the setting of severe anemia. Troponins 3803 > 3348 > 9484 > 16108 > 16416. No recurrence of CP since receiving blood transfusion. Echo this admission with low normal  EF 50-55%, no wall motion abnormalities. LHC yesterday with multivessel disease but patent grafts, no clear culprit for NSTEMI.  -Continue home aspirin 81 mg daily and atorvastatin 20 mg daily. Consider addition of plavix outpatient pending hemoglobin.   # ESRD on HD Patient with ESRD on TTS HD.  -Management per nephrology.  # Hx PE # ? History of atrial fibrillation Noted hx of PE from 03/2022 and taking eliquis 5mg  BID for this. He carries a diagnosis of AF in his chart, but I have not seen EKG or tele evidence of AF during this admission or during prior admissions when our service has been consulted. Continue tele monitoring while inpatient and ?outpatient ambulatory monitoring if indicated.  -Restart eliquis 2.5 mg twice daily.   Ok for discharge today from a cardiac perspective. Will arrange for follow up in clinic with Dr. Darrold Junker in 1-2 weeks.    This patient's case was discussed and created with Dr. Darrold Junker and he is in agreement.  Signed:  Gale Journey, PA-C  01/13/2023, 1:34 PM [facility] Cardiology

## 2023-01-13 NOTE — Progress Notes (Signed)
[facility]  ROUNDING NOTE   Subjective:   Anthony Hess is a 67 year old male with past medical conditions including hypertension, PVD, dyslipidemia, CAD, four-vessel CABG, and end-stage renal disease on hemodialysis. Patient presents for outpatient angiogram with vascular surgery and was found to have abnormal labs. He is admitted for Hyperkalemia [E87.5] SOB (shortness of breath) [R06.02] ESRD on hemodialysis (HCC) [N18.6, Z99.2] Demand ischemia of myocardium (HCC) [I24.89] Severe anemia [D64.9] Gastrointestinal hemorrhage, unspecified gastrointestinal hemorrhage type [K92.2]  Patient is known to our practice and receives outpatient dialysis at [facility] on a TTS schedule,    Patient presented to the emergency room via EMS for shortness of breath and required oxygen supplementation by facemask.  His labs show hemoglobin down to 4.  Patient also reported dark stools for the past 2 days prior to admission.  He is on Eliquis.  He was also found to have severe hyperkalemia with potassium of 6.3.  He was given shifting measures in the emergency room. He missed his Thursday outpatient dialysis treatment.  Patient received urgent hemodialysis treatment shortly after admission for hyperkalemia and was transfused 3 units of blood. He underwent EGD this admission as well which showed normal esophagus, diffuse mild inflammation in the stomach, 1 oozing cratered duodenal ulcer with pigmented material was found in the duodenal bulb.  Hemostasis was achieved with bipolar probe.  Update:  Patient underwent cardiac catheterization yesterday. He is due for hemodialysis treatment today.  Objective:  Vital signs in last 24 hours:  Temp:  [97.5 F (36.4 C)-99 F (37.2 C)] 98 F (36.7 C) (11/21 0938) Pulse Rate:  [35-108] 91 (11/21 1130) Resp:  [13-26] 26 (11/21 1130) BP: (91-223)/(57-195) 109/57 (11/21 1130) SpO2:  [80 %-100 %] 99 % (11/21 1130)  Weight change:  Filed Weights    01/08/23 2135 01/11/23 0902 01/11/23 1300  Weight: 53.1 kg 54 kg 52 kg    Intake/Output: I/O last 3 completed shifts: In: 240 [P.O.:240] Out: -    Intake/Output this shift:  No intake/output data recorded.  Physical Exam: General: NAD  Head: Normocephalic, atraumatic. Moist oral mucosal membranes  Eyes: Anicteric  Neck: Supple  Lungs:  Normal breathing effort   Heart: Regular rate and rhythm  Abdomen:  Soft, nontender  Extremities: No peripheral edema.  Neurologic: Alert and oriented, answering questions appropriately  Skin: No lesions,   Access: Rt chest permcath    Basic Metabolic Panel: Recent Labs  Lab 01/10/23 0400 01/11/23 0523 01/11/23 1428 01/12/23 0539 01/13/23 0343  NA 139 139 135 140 136  K 4.5 4.1 3.9 4.3 4.4  CL 100 100 97* 105 100  CO2 26 22 27 25  21*  GLUCOSE 84 93 123* 111* 138*  BUN 69* 96* 37* 58* 78*  CREATININE 5.84* 8.06* 4.04* 5.76* 7.03*  CALCIUM 6.8* 6.3* 7.2* 6.9* 6.7*  PHOS  --   --  2.6  --   --     Liver Function Tests: Recent Labs  Lab 01/08/23 1324 01/11/23 1428  AST 28  --   ALT 17  --   ALKPHOS 45  --   BILITOT 0.2  --   PROT 5.2*  --   ALBUMIN 2.5* 2.6*   Recent Labs  Lab 01/08/23 1324  LIPASE 28   No results for input(s): "AMMONIA" in the last 168 hours.  CBC: Recent Labs  Lab 01/08/23 1324 01/08/23 1426 01/08/23 2255 01/09/23 0448 01/10/23 0400 01/11/23 0523 01/11/23 1428 01/12/23 0539 01/13/23 0343  WBC 18.4* 17.4* 17.3*   < >  11.2* 13.5* 13.7* 15.5* 14.5*  NEUTROABS 15.6* 14.9* 14.7*  --  8.2*  --   --   --   --   HGB 4.2* 4.0* 7.7*   < > 8.4* 8.4* 9.6* 9.1* 8.3*  HCT 14.3* 13.5* 22.2*   < > 24.4* 25.9* 29.4* 28.3* 25.9*  MCV 100.0 100.7* 86.7   < > 87.1 91.2 88.6 90.4 91.5  PLT 505* 519* 311   < > 314 342 360 366 384   < > = values in this interval not displayed.    Cardiac Enzymes: No results for input(s): "CKTOTAL", "CKMB", "CKMBINDEX", "TROPONINI" in the last 168 hours.  BNP: Invalid  input(s): "POCBNP"  CBG: Recent Labs  Lab 01/08/23 1726  GLUCAP 112*     Microbiology: Results for orders placed or performed during the hospital encounter of 01/08/23  Culture, blood (routine x 2)     Status: None   Collection Time: 01/08/23  1:24 PM   Specimen: BLOOD RIGHT ARM  Result Value Ref Range Status   Specimen Description BLOOD RIGHT ARM  Final   Special Requests   Final    BOTTLES DRAWN AEROBIC AND ANAEROBIC Blood Culture results may not be optimal due to an excessive volume of blood received in culture bottles   Culture   Final    NO GROWTH 5 DAYS Performed at [facility], [address], [place], [place] [address]    Report Status 01/13/2023 FINAL  Final  Culture, blood (routine x 2)     Status: None   Collection Time: 01/08/23  2:27 PM   Specimen: BLOOD  Result Value Ref Range Status   Specimen Description BLOOD BLOOD LEFT FOREARM  Final   Special Requests   Final    BOTTLES DRAWN AEROBIC AND ANAEROBIC Blood Culture adequate volume   Culture   Final    NO GROWTH 5 DAYS Performed at [facility], [address], [place], [place] [address]    Report Status 01/13/2023 FINAL  Final  MRSA Next Gen by PCR, Nasal     Status: None   Collection Time: 01/08/23  5:19 PM   Specimen: Nasal Mucosa; Nasal Swab  Result Value Ref Range Status   MRSA by PCR Next Gen NOT DETECTED NOT DETECTED Final    Comment: (NOTE) The GeneXpert MRSA Assay (FDA approved for NASAL specimens only), is one component of a comprehensive MRSA colonization surveillance program. It is not intended to diagnose MRSA infection nor to guide or monitor treatment for MRSA infections. Test performance is not FDA approved in patients less than 38 years old. Performed at [facility], [address] [place], [place] [address]     Coagulation Studies: No results for input(s): "LABPROT", "INR" in the last 72 hours.    Urinalysis: No results for input(s):  "COLORURINE", "LABSPEC", "PHURINE", "GLUCOSEU", "HGBUR", "BILIRUBINUR", "KETONESUR", "PROTEINUR", "UROBILINOGEN", "NITRITE", "LEUKOCYTESUR" in the last 72 hours.  Invalid input(s): "APPERANCEUR"    Imaging: CARDIAC CATHETERIZATION  Result Date: 01/12/2023   Suezanne Jacquet Cx to Prox Cx lesion is 100% stenosed.   Ost LAD to Prox LAD lesion is 80% stenosed.   Prox RCA lesion is 100% stenosed.   1st Mrg lesion is 70% stenosed.   There is mild left ventricular systolic dysfunction.   The left ventricular ejection fraction is 45-50% by visual estimate. 1.  Severe three-vessel coronary artery disease with 80% stenosis ostial LAD, 100% stenosis ostial left circumflex, 100% stenosis proximal RCA 2.  Patent LIMA to  LAD, patent RIMA to PDA, patent right radial to OM1 with 60-70% stenosis after anastomotic site 3.  Preserved left ventricular function with estimated LV ejection fraction 45% 4.  No definitive culprit lesion Recommendations 1.  Maximize antianginal therapy 2.  Resume Eliquis in a.m. 3.  If patient tolerates Eliquis without recurrent GI bleed and will consider adding clopidogrel 4.  Potential discharge home on 01/13/2023     Medications:    sodium chloride       apixaban  2.5 mg Oral BID   aspirin EC  81 mg Oral Daily   atorvastatin  20 mg Oral Daily   Chlorhexidine Gluconate Cloth  6 each Topical Q0600   epoetin (EPOGEN/PROCRIT) injection  4,000 Units Intravenous Q T,Th,Sa-HD   isosorbide dinitrate  10 mg Oral TID   pantoprazole (PROTONIX) IV  40 mg Intravenous Q12H   sodium chloride, acetaminophen, hydrALAZINE, iodixanol, iohexol, ondansetron (ZOFRAN) IV, mouth rinse  Assessment/ Plan:  Mr. Gerrod Billups is a 67 y.o.  male with past medical conditions including hypertension, PVD, dyslipidemia, CAD, four-vessel CABG, and end-stage renal disease on hemodialysis. Patient presents for outpatient angiogram with vascular surgery and was found to have abnormal labs. He is bring admitted for Hyperkalemia  [E87.5] SOB (shortness of breath) [R06.02] ESRD on hemodialysis (HCC) [N18.6, Z99.2] Demand ischemia of myocardium (HCC) [I24.89] Severe anemia [D64.9] Gastrointestinal hemorrhage, unspecified gastrointestinal hemorrhage type [K92.2]   End stage renal disease with hyperkalemia on hemodialysis.  Patient due for hemodialysis treatment today.  Orders have been prepared.  2. Anemia of chronic kidney disease and acute Blood loss.  Lab Results  Component Value Date   HGB 8.3 (L) 01/13/2023  Hemoglobin was severely low and thought to be secondary to GI bleed.  EGD today showed cratered duodenal ulcer which was treated with bipolar probe.  He received 3 units of blood transfusion with dialysis on 01/08/23.  Update: Hemoglobin down a bit to 8.3.  Continue Epogen here and resume Mircera as outpatient.  3. Secondary Hyperparathyroidism:    Lab Results  Component Value Date   PTH 170 (H) 06/25/2022   CALCIUM 6.7 (L) 01/13/2023   CAION 0.82 (LL) 10/13/2022   PHOS 2.6 01/11/2023    Phos remains at target at 2.6.  4.  Peripheral vascular disease-patient is status post angioplasty of right leg with stent placement on 12/23/2022.   LOS: 5 Tavaras Goody 11/21/202411:43 AM

## 2023-01-17 ENCOUNTER — Ambulatory Visit: Payer: Medicare Other | Admitting: Podiatry

## 2023-01-18 ENCOUNTER — Other Ambulatory Visit (INDEPENDENT_AMBULATORY_CARE_PROVIDER_SITE_OTHER): Payer: Self-pay | Admitting: Nurse Practitioner

## 2023-01-18 ENCOUNTER — Telehealth (INDEPENDENT_AMBULATORY_CARE_PROVIDER_SITE_OTHER): Payer: Self-pay

## 2023-01-18 MED ORDER — OXYCODONE-ACETAMINOPHEN 5-325 MG PO TABS: 1.0000 | ORAL_TABLET | Freq: Four times a day (QID) | ORAL | 0 refills | Status: DC | PRN

## 2023-01-18 NOTE — Telephone Encounter (Signed)
Typically we don't continue to send pain medications this far past revascularization.  I will send in a one time refill before his upcoming appointment.  If he misses his appointment, we will not send any additional pain medcation

## 2023-01-18 NOTE — Telephone Encounter (Signed)
Patient notified

## 2023-01-18 NOTE — Telephone Encounter (Signed)
Patient calling for pain medication for his leg.   Please advise

## 2023-01-27 ENCOUNTER — Ambulatory Visit (INDEPENDENT_AMBULATORY_CARE_PROVIDER_SITE_OTHER): Payer: Medicare Other

## 2023-01-27 ENCOUNTER — Ambulatory Visit (INDEPENDENT_AMBULATORY_CARE_PROVIDER_SITE_OTHER): Payer: Medicare Other | Admitting: Vascular Surgery

## 2023-01-27 ENCOUNTER — Encounter (INDEPENDENT_AMBULATORY_CARE_PROVIDER_SITE_OTHER): Payer: Self-pay | Admitting: Vascular Surgery

## 2023-01-27 VITALS — BP 136/97 | HR 80 | Resp 18 | Ht 62.0 in | Wt 118.0 lb

## 2023-01-27 DIAGNOSIS — I10 Essential (primary) hypertension: Secondary | ICD-10-CM | POA: Diagnosis not present

## 2023-01-27 DIAGNOSIS — I70221 Atherosclerosis of native arteries of extremities with rest pain, right leg: Secondary | ICD-10-CM

## 2023-01-27 DIAGNOSIS — I7143 Infrarenal abdominal aortic aneurysm, without rupture: Secondary | ICD-10-CM | POA: Diagnosis not present

## 2023-01-27 DIAGNOSIS — I482 Chronic atrial fibrillation, unspecified: Secondary | ICD-10-CM | POA: Diagnosis not present

## 2023-01-27 DIAGNOSIS — I25119 Atherosclerotic heart disease of native coronary artery with unspecified angina pectoris: Secondary | ICD-10-CM

## 2023-01-27 DIAGNOSIS — N186 End stage renal disease: Secondary | ICD-10-CM

## 2023-01-30 ENCOUNTER — Encounter (INDEPENDENT_AMBULATORY_CARE_PROVIDER_SITE_OTHER): Payer: Self-pay | Admitting: Vascular Surgery

## 2023-01-30 MED ORDER — TRAMADOL HCL 50 MG PO TABS
50.0000 mg | ORAL_TABLET | Freq: Two times a day (BID) | ORAL | 0 refills | Status: DC | PRN
Start: 2023-01-30 — End: 2023-03-08

## 2023-01-30 NOTE — Progress Notes (Signed)
MRN : [id number]  Anthony Hess is a 67 y.o. (1955/05/29) male who presents with chief complaint of check circulation.  History of Present Illness:   The patient returns to the office for followup and review status post angiogram with intervention on 12/23/2022.    Procedure:  Percutaneous transluminal angioplasty of right posterior tibial artery and tibioperoneal trunk with 2 mm diameter by 30 cm length angioplasty balloon 2.    Percutaneous transluminal angioplasty of the right SFA and popliteal arteries with 5 mm diameter by 30 cm length angioplasty balloon 3.    Stent placement to the right SFA and popliteal arteries with 6 mm diameter by 25 cm length and 6 mm diameter by 15 cm length Viabahn stents   The patient notes improvement in the lower extremity symptoms.  At the time of discharge he was unable to move or feel his foot.  Today he is stating he can feel it with relatively good sensation.  He is also able to move his foot.  No new ulcers or wounds have occurred since the last visit.   There have been no significant changes to the patient's overall health care.   No documented history of amaurosis fugax or recent TIA symptoms. There are no recent neurological changes noted. No documented history of DVT, PE or superficial thrombophlebitis. The patient denies recent episodes of angina or shortness of breath.   Current Meds  Medication Sig   apixaban (ELIQUIS) 2.5 MG TABS tablet Take 1 tablet (2.5 mg total) by mouth 2 (two) times daily.   aspirin EC 81 MG tablet Take 81 mg by mouth daily.   atorvastatin (LIPITOR) 20 MG tablet Take 1 tablet (20 mg total) by mouth daily.   isosorbide dinitrate (ISORDIL) 10 MG tablet Take 1 tablet (10 mg total) by mouth 3 (three) times daily.   pantoprazole (PROTONIX) 40 MG tablet Take 1 tablet (40 mg total) by mouth 2 (two) times daily before a meal.   sevelamer carbonate  (RENVELA) 800 MG tablet Take 1,600 mg by mouth 3 (three) times daily.   traMADol (ULTRAM) 50 MG tablet Take 1 tablet (50 mg total) by mouth every 12 (twelve) hours as needed for severe pain (pain score 7-10).   [DISCONTINUED] oxyCODONE-acetaminophen (PERCOCET/ROXICET) 5-325 MG tablet Take 1 tablet by mouth every 6 (six) hours as needed for severe pain (pain score 7-10).    Past Medical History:  Diagnosis Date   Aortic atherosclerosis (HCC)    Bilateral carotid artery disease (HCC)    Bladder cancer (HCC)    Coronary artery disease 12/20/2018   a.) LHC 12/20/2018: 50% OM1, 40% OM2, 95% o-pLAD, 75% o=pLCx, 40% mLM, 70% D1, 60% mRCA-1, 50% mRCA-2; refer to CVTS. b.) 4v CABG [facility] on 12/27/2018: LIMA-LAD, RIMA-PDA, seg LRA-OM1-D1   DCM (dilated cardiomyopathy) (HCC) 12/05/2018   a.) TTE 12/05/2018: EF 40-45%. b.) TTE 12/28/2019: EF 20-25%.   Dyspnea 10/15/2022   ESRD (end stage renal disease) (HCC)    a.) T-Th-Sat   HFrEF (heart failure with reduced ejection fraction) (HCC) 12/05/2018   a.) TTE 12/05/2018: EF 40-45%; mild  LVH; ant/apical/sep HK; mild TR . b.) TTE 12/28/2019: EF 20-25%; mod LVH; mod MR/AR; G1DD.   History of 2019 novel coronavirus disease (COVID-19) 04/08/2021   History of kidney stones    HLD (hyperlipidemia)    Hx of CABG 12/27/2018   Hypertension    Infrarenal abdominal aortic aneurysm (AAA) without rupture (HCC) 03/05/2021   a.) CT abd/pelvis; measured 3.2 cm.   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   a.) LIMA-LAD, RIMA-PDA, sequential LEFT radial artery to OM1 and D1   Sepsis (HCC) 03/14/2021   Wears glasses     Past Surgical History:  Procedure Laterality Date   AV FISTULA PLACEMENT Left 07/30/2021   Procedure: INSERTION OF ARTERIOVENOUS (AV) GORE-TEX GRAFT ARM BRACHIAL ARTERY TO  AXILLARY VEIN;  Surgeon: Annice Needy, MD;  Location: [facility] ORS;  Service: Vascular;  Laterality: Left;   CAPD INSERTION N/A 12/31/2019   Procedure: LAPAROSCOPIC INSERTION CONTINUOUS AMBULATORY PERITONEAL DIALYSIS  (CAPD) CATHETER;  Surgeon: Leafy Ro, MD;  Location: [facility] ORS;  Service: General;  Laterality: N/A;   CAPD REMOVAL N/A 04/10/2020   Procedure: LAPAROSCOPIC REVISION OF CONTINUOUS AMBULATORY PERITONEAL DIALYSIS  (CAPD) CATHETER;  Surgeon: Leafy Ro, MD;  Location: [facility] ORS;  Service: General;  Laterality: N/A;   CORONARY ARTERY BYPASS GRAFT N/A 12/27/2018   Procedure: CORONARY ARTERY BYPASS GRAFTING (CABG) X 4 ON PUMP USING RIGHT & LEFT INTERNAL MAMMARY ARTERY LEFT RADIAL ARTERY ENDOSCOPICALLY HARVESTED;  Surgeon: Linden Dolin, MD;  Location: [facility];  Service: Open Heart Surgery;  Laterality: N/A;   CYSTOSCOPY W/ RETROGRADES Bilateral 05/15/2019   Procedure: CYSTOSCOPY WITH RETROGRADE PYELOGRAM;  Surgeon: Riki Altes, MD;  Location: [facility] ORS;  Service: Urology;  Laterality: Bilateral;   CYSTOSCOPY WITH BIOPSY N/A 05/15/2019   Procedure: CYSTOSCOPY WITH bladder BIOPSY;  Surgeon: Riki Altes, MD;  Location: [facility] ORS;  Service: Urology;  Laterality: N/A;   DIALYSIS/PERMA CATHETER INSERTION N/A 12/28/2019   Procedure: DIALYSIS/PERMA CATHETER INSERTION;  Surgeon: Annice Needy, MD;  Location: [facility];  Service: Cardiovascular;  Laterality: N/A;   DIALYSIS/PERMA CATHETER INSERTION N/A 03/18/2021   Procedure: DIALYSIS/PERMA CATHETER INSERTION;  Surgeon: Annice Needy, MD;  Location: [facility];  Service: Cardiovascular;  Laterality: N/A;   DIALYSIS/PERMA CATHETER REMOVAL N/A 06/02/2020   Procedure: DIALYSIS/PERMA CATHETER REMOVAL;  Surgeon: Annice Needy, MD;  Location: [facility];  Service: Cardiovascular;  Laterality: N/A;   DIALYSIS/PERMA CATHETER REPAIR N/A 10/22/2022   Procedure: DIALYSIS/PERMA CATHETER REPAIR;  Surgeon: Renford Dills, MD;   Location: [facility];  Service: Cardiovascular;  Laterality: N/A;   ESOPHAGOGASTRODUODENOSCOPY N/A 01/09/2023   Procedure: ESOPHAGOGASTRODUODENOSCOPY (EGD);  Surgeon: Toledo, Boykin Nearing, MD;  Location: [facility] ENDOSCOPY;  Service: Gastroenterology;  Laterality: N/A;   EXCHANGE OF A DIALYSIS CATHETER Right 04/10/2020   Procedure: EXCHANGE OF A DIALYSIS CATHETER;  Surgeon: Leafy Ro, MD;  Location: [facility];  Service: General;  Laterality: Right;   HEMOSTASIS CONTROL  01/09/2023   Procedure: HEMOSTASIS CONTROL;  Surgeon: Norma Fredrickson, Boykin Nearing, MD;  Location: [facility] ENDOSCOPY;  Service: Gastroenterology;;   [name] HERNIA REPAIR  01/20/2021   Procedure: HERNIA REPAIR INCISIONAL;  Surgeon: Henrene Dodge, MD;  Location: [facility];  Service: General;;   IR IMAGE GUIDED DRAINAGE PERCUT CATH  PERITONEAL RETROPERIT  04/07/2020   LAPAROSCOPY N/A 04/16/2021   Procedure: LAPAROSCOPY DIAGNOSTIC;  Surgeon: Sung Amabile, DO;  Location: [facility] OR[facility];  Service: General;  Laterality: N/A;   LAPAROTOMY N/A 04/26/2022   Procedure: EXPLORATORY LAPAROTOMY WITH REPAIR OF DUODENAL ULCER;  Surgeon: Carolan Shiver, MD;  Location: [facility];  Service: General;  Laterality: N/A;   LEFT HEART CATH AND CORONARY ANGIOGRAPHY Left 12/20/2018   Procedure: LEFT HEART CATH AND CORONARY ANGIOGRAPHY;  Surgeon: Marcina Millard, MD;  Location: [facility];  Service: Cardiovascular;  Laterality: Left;   LEFT HEART CATH AND CORONARY ANGIOGRAPHY N/A 01/12/2023   Procedure: LEFT HEART CATH AND CORONARY ANGIOGRAPHY;  Surgeon: Marcina Millard, MD;  Location: [facility];  Service: Cardiovascular;  Laterality: N/A;   LOWER EXTREMITY ANGIOGRAPHY Right 12/23/2022   Procedure: Lower Extremity Angiography;  Surgeon: Annice Needy, MD;  Location: [facility];  Service: Cardiovascular;  Laterality: Right;   RADIAL ARTERY HARVEST Left 12/27/2018   Procedure: ENDOSCOPIC RADIAL ARTERY HARVEST;  Surgeon: Linden Dolin, MD;  Location: [facility];  Service: Open Heart Surgery;  Laterality: Left;   REMOVAL OF A DIALYSIS CATHETER Left 03/20/2021   Procedure: REMOVAL OF A PD CATHETER;  Surgeon: Annice Needy, MD;  Location: [facility];  Service: Vascular;  Laterality: Left;   REVISION OF ARTERIOVENOUS GORETEX GRAFT Left 09/11/2021   Procedure: Excisionof infected AV graft;  Surgeon: Renford Dills, MD;  Location: [facility];  Service: Vascular;  Laterality: Left;   SUBMUCOSAL INJECTION  01/09/2023   Procedure: SUBMUCOSAL INJECTION;  Surgeon: Toledo, Boykin Nearing, MD;  Location: [facility] ENDOSCOPY;  Service: Gastroenterology;;   TEE WITHOUT CARDIOVERSION N/A 12/27/2018   Procedure: TRANSESOPHAGEAL ECHOCARDIOGRAM (TEE);  Surgeon: Linden Dolin, MD;  Location: [facility] OR;  Service: Open Heart Surgery;  Laterality: N/A;   TUMOR REMOVAL  2019   Bladder    Social History Social History   Tobacco Use   Smoking status: Former    Types: Cigarettes    Start date: 12/2022    Quit date: 02/19/2019    Years since quitting: 3.9   Smokeless tobacco: Never  Vaping Use   Vaping status: Never Used  Substance Use Topics   Alcohol use: Never   Drug use: Never    Family History Family History  Problem Relation Age of Onset   Heart failure Mother     No Known Allergies   REVIEW OF SYSTEMS (Negative unless checked)  Constitutional: [] Weight loss  [] Fever  [] Chills Cardiac: [] Chest pain   [] Chest pressure   [] Palpitations   [] Shortness of breath when laying flat   [] Shortness of breath with exertion. Vascular:  [x] Pain in legs with walking   [] Pain in legs at rest  [] History of DVT   [] Phlebitis   [] Swelling in legs   [] Varicose veins   [] Non-healing ulcers Pulmonary:   [] Uses home oxygen   [] Productive cough   [] Hemoptysis   [] Wheeze  [] COPD   [] Asthma Neurologic:  [] Dizziness   [] Seizures   [] History of stroke   [] History of TIA  [] Aphasia   [] Vissual changes   [] Weakness or numbness in arm   [] Weakness or numbness in  leg Musculoskeletal:   [] Joint swelling   [] Joint pain   [] Low back pain Hematologic:  [] Easy bruising  [] Easy bleeding   [] Hypercoagulable state   [] Anemic Gastrointestinal:  [] Diarrhea   [] Vomiting  [] Gastroesophageal reflux/heartburn   [] Difficulty swallowing. Genitourinary:  [] Chronic kidney disease   [] Difficult urination  []   Frequent urination   [] Blood in urine Skin:  [] Rashes   [] Ulcers  Psychological:  [] History of anxiety   []  History of major depression.  Physical Examination  Vitals:   01/27/23 1609  BP: (!) 136/97  Pulse: 80  Resp: 18  Weight: 118 lb (53.5 kg)  Height: 5\' 2"  (1.575 m)   Body mass index is 21.58 kg/m. Gen: WD/WN, NAD Head: [place]/AT, No temporalis wasting.  Ear/Nose/Throat: Hearing grossly intact, nares w/o erythema or drainage Eyes: PER, EOMI, sclera nonicteric.  Neck: Supple, no masses.  No bruit or JVD.  Pulmonary:  Good air movement, no audible wheezing, no use of accessory muscles.  Cardiac: RRR, normal S1, S2, no Murmurs. Vascular:  mild trophic changes, no open wounds Vessel Right Left  Radial Palpable Palpable  PT Not Palpable Not Palpable  DP Not Palpable Not Palpable  Gastrointestinal: soft, non-distended. No guarding/no peritoneal signs.  Musculoskeletal: M/S 5/5 throughout.  No visible deformity.  Neurologic: CN 2-12 intact. Pain and light touch intact in extremities.  Symmetrical.  Speech is fluent. Motor exam as listed above. Psychiatric: Judgment intact, Mood & affect appropriate for pt's clinical situation. Dermatologic: No rashes or ulcers noted.  No changes consistent with cellulitis.   CBC Lab Results  Component Value Date   WBC 14.5 (H) 01/13/2023   HGB 8.3 (L) 01/13/2023   HCT 25.9 (L) 01/13/2023   MCV 91.5 01/13/2023   PLT 384 01/13/2023    BMET    Component Value Date/Time   NA 136 01/13/2023 0343   NA 141 06/25/2022 1414   K 4.4 01/13/2023 0343   CL 100 01/13/2023 0343   CO2 21 (L) 01/13/2023 0343   GLUCOSE 138  (H) 01/13/2023 0343   BUN 78 (H) 01/13/2023 0343   BUN 39 (H) 06/25/2022 1414   CREATININE 7.03 (H) 01/13/2023 0343   CREATININE 4.62 (H) 01/10/2019 1351   CALCIUM 6.7 (L) 01/13/2023 0343   GFRNONAA 8 (L) 01/13/2023 0343   GFRAA 15 (L) 01/04/2020 1542   Estimated Creatinine Clearance: 8 mL/min (A) (by C-G formula based on SCr of 7.03 mg/dL (H)).  COAG Lab Results  Component Value Date   INR 1.2 01/08/2023   INR 1.3 (H) 12/21/2022   INR 1.1 10/15/2022    Radiology CARDIAC CATHETERIZATION  Result Date: 01/12/2023   Ost Cx to Prox Cx lesion is 100% stenosed.   Ost LAD to Prox LAD lesion is 80% stenosed.   Prox RCA lesion is 100% stenosed.   1st Mrg lesion is 70% stenosed.   There is mild left ventricular systolic dysfunction.   The left ventricular ejection fraction is 45-50% by visual estimate. 1.  Severe three-vessel coronary artery disease with 80% stenosis ostial LAD, 100% stenosis ostial left circumflex, 100% stenosis proximal RCA 2.  Patent LIMA to LAD, patent RIMA to PDA, patent right radial to OM1 with 60-70% stenosis after anastomotic site 3.  Preserved left ventricular function with estimated LV ejection fraction 45% 4.  No definitive culprit lesion Recommendations 1.  Maximize antianginal therapy 2.  Resume Eliquis in a.m. 3.  If patient tolerates Eliquis without recurrent GI bleed and will consider adding clopidogrel 4.  Potential discharge home on 01/13/2023   ECHOCARDIOGRAM COMPLETE  Result Date: 01/10/2023    ECHOCARDIOGRAM REPORT   Patient Name:   CLOUD OBERBROECKLING Date of Exam: 01/10/2023 Medical Rec #:  [id number]    Height:       62.0 in Accession #:    [id number]  Weight:       117.1 lb Date of Birth:  12-27-1955   BSA:          1.523 m Patient Age:    66 years     BP:           141/94 mmHg Patient Gender: M            HR:           68 bpm. Exam Location:  [facility] Procedure: 2D Echo, Cardiac Doppler and Color Doppler Indications:     NSTEMI  History:         Patient has prior  history of Echocardiogram examinations, most                  recent 09/15/2022. CHF, Acute MI, CAD and Previous Myocardial                  Infarction, Prior CABG, COPD, Arrythmias:Atrial Fibrillation,                  Signs/Symptoms:Dyspnea; Risk Factors:Hypertension, Dyslipidemia                  and Current Smoker. AAA, Pulmonary embolus, ESRD on dialysis.  Sonographer:     Mikki Harbor Referring Phys:  [id number] CARALYN HUDSON Diagnosing Phys: Marcina Millard MD IMPRESSIONS  1. Left ventricular ejection fraction, by estimation, is 50 to 55%. The left ventricle has low normal function. The left ventricle has no regional wall motion abnormalities. There is moderate left ventricular hypertrophy. Left ventricular diastolic parameters were normal.  2. Right ventricular systolic function is normal. The right ventricular size is normal. There is normal pulmonary artery systolic pressure.  3. The mitral valve is normal in structure. Mild mitral valve regurgitation. No evidence of mitral stenosis.  4. The aortic valve is normal in structure. Aortic valve regurgitation is not visualized. Mild aortic valve stenosis.  5. The inferior vena cava is normal in size with greater than 50% respiratory variability, suggesting right atrial pressure of 3 mmHg. FINDINGS  Left Ventricle: Left ventricular ejection fraction, by estimation, is 50 to 55%. The left ventricle has low normal function. The left ventricle has no regional wall motion abnormalities. The left ventricular internal cavity size was normal in size. There is moderate left ventricular hypertrophy. Left ventricular diastolic parameters were normal. Right Ventricle: The right ventricular size is normal. No increase in right ventricular wall thickness. Right ventricular systolic function is normal. There is normal pulmonary artery systolic pressure. The tricuspid regurgitant velocity is 2.42 m/s, and  with an assumed right atrial pressure of 3 mmHg, the estimated  right ventricular systolic pressure is 26.4 mmHg. Left Atrium: Left atrial size was normal in size. Right Atrium: Right atrial size was normal in size. Pericardium: There is no evidence of pericardial effusion. Mitral Valve: The mitral valve is normal in structure. Mild mitral valve regurgitation. No evidence of mitral valve stenosis. MV peak gradient, 3.3 mmHg. The mean mitral valve gradient is 1.0 mmHg. Tricuspid Valve: The tricuspid valve is normal in structure. Tricuspid valve regurgitation is mild . No evidence of tricuspid stenosis. Aortic Valve: The aortic valve is normal in structure. Aortic valve regurgitation is not visualized. Mild aortic stenosis is present. Aortic valve mean gradient measures 9.0 mmHg. Aortic valve peak gradient measures 15.4 mmHg. Aortic valve area, by VTI measures 2.21 cm. Pulmonic Valve: The pulmonic valve was normal in structure. Pulmonic valve regurgitation is not visualized. No evidence of pulmonic  stenosis. Aorta: The aortic root is normal in size and structure. Venous: The inferior vena cava is normal in size with greater than 50% respiratory variability, suggesting right atrial pressure of 3 mmHg. IAS/Shunts: No atrial level shunt detected by color flow Doppler.  LEFT VENTRICLE PLAX 2D LVIDd:         4.60 cm      Diastology LVIDs:         3.40 cm      LV e' medial:    6.31 cm/s LV PW:         1.40 cm      LV E/e' medial:  15.4 LV IVS:        1.40 cm      LV e' lateral:   14.00 cm/s LVOT diam:     2.00 cm      LV E/e' lateral: 7.0 LV SV:         86 LV SV Index:   57 LVOT Area:     3.14 cm  LV Volumes (MOD) LV vol d, MOD A2C: 93.8 ml LV vol d, MOD A4C: 107.0 ml LV vol s, MOD A2C: 52.7 ml LV vol s, MOD A4C: 51.1 ml LV SV MOD A2C:     41.1 ml LV SV MOD A4C:     107.0 ml LV SV MOD BP:      47.6 ml RIGHT VENTRICLE RV Basal diam:  3.90 cm RV Mid diam:    4.40 cm RV S prime:     9.68 cm/s LEFT ATRIUM             Index        RIGHT ATRIUM           Index LA diam:        4.40 cm 2.89  cm/m   RA Area:     17.90 cm LA Vol (A2C):   79.6 ml 52.28 ml/m  RA Volume:   45.10 ml  29.62 ml/m LA Vol (A4C):   57.5 ml 37.77 ml/m LA Biplane Vol: 67.7 ml 44.47 ml/m  AORTIC VALVE                     PULMONIC VALVE AV Area (Vmax):    2.12 cm      PV Vmax:       1.36 m/s AV Area (Vmean):   2.03 cm      PV Peak grad:  7.4 mmHg AV Area (VTI):     2.21 cm AV Vmax:           196.00 cm/s AV Vmean:          143.000 cm/s AV VTI:            0.389 m AV Peak Grad:      15.4 mmHg AV Mean Grad:      9.0 mmHg LVOT Vmax:         132.00 cm/s LVOT Vmean:        92.400 cm/s LVOT VTI:          0.274 m LVOT/AV VTI ratio: 0.70  AORTA Ao Root diam: 3.50 cm MITRAL VALVE               TRICUSPID VALVE MV Area (PHT): 2.62 cm    TR Peak grad:   23.4 mmHg MV Area VTI:   2.56 cm    TR Vmax:        242.00 cm/s MV Peak grad:  3.3 mmHg MV Mean grad:  1.0 mmHg    SHUNTS MV Vmax:       0.91 m/s    Systemic VTI:  0.27 m MV Vmean:      46.9 cm/s   Systemic Diam: 2.00 cm MV Decel Time: 289 msec MV E velocity: 97.30 cm/s MV A velocity: 87.80 cm/s MV E/A ratio:  1.11 Marcina Millard MD Electronically signed by Marcina Millard MD Signature Date/Time: 01/10/2023/1:43:11 PM    Final    CT Angio Chest PE W/Cm &/Or Wo Cm  Result Date: 01/08/2023 CLINICAL DATA:  Acute shortness of breath, wheezing, and hypoxia. Previous pulmonary embolism. EXAM: CT ANGIOGRAPHY CHEST WITH CONTRAST TECHNIQUE: Multidetector CT imaging of the chest was performed using the standard protocol during bolus administration of intravenous contrast. Multiplanar CT image reconstructions and MIPs were obtained to evaluate the vascular anatomy. RADIATION DOSE REDUCTION: This exam was performed according to the departmental dose-optimization program which includes automated exposure control, adjustment of the mA and/or kV according to patient size and/or use of iterative reconstruction technique. CONTRAST:  75mL OMNIPAQUE IOHEXOL 350 MG/ML SOLN COMPARISON:   10/14/2022 FINDINGS: Cardiovascular: Satisfactory opacification of pulmonary arteries noted. A single tiny nonocclusive embolus is seen in a subsegmental left lower lobe pulmonary artery branch (image 278/6) which is unchanged since previous study. No acute pulmonary embolism identified. No evidence of thoracic aortic dissection or aneurysm. Mediastinum/Nodes: No masses or pathologically enlarged lymph nodes identified. Lungs/Pleura: Moderate centrilobular emphysema again noted. No pulmonary mass, infiltrate, or effusion. Upper abdomen: No acute findings. Musculoskeletal: No suspicious bone lesions identified. Review of the MIP images confirms the above findings. IMPRESSION: No evidence of acute pulmonary embolism or other acute findings. 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 Emphysema (ICD10-J43.9). Electronically Signed   By: Danae Orleans M.D.   On: 01/08/2023 17:42   CT ABDOMEN PELVIS W CONTRAST  Result Date: 01/08/2023 CLINICAL DATA:  Acute abdominal pain. EXAM: CT ABDOMEN AND PELVIS WITH CONTRAST TECHNIQUE: Multidetector CT imaging of the abdomen and pelvis was performed using the standard protocol following bolus administration of intravenous contrast. RADIATION DOSE REDUCTION: This exam was performed according to the departmental dose-optimization program which includes automated exposure control, adjustment of the mA and/or kV according to patient size and/or use of iterative reconstruction technique. CONTRAST:  75mL OMNIPAQUE IOHEXOL 350 MG/ML SOLN COMPARISON:  09/03/2022 FINDINGS: Lower Chest: No acute findings. Hepatobiliary: Several previously demonstrated benign hemangiomas and cysts show no significant change since prior study. Probable tiny gallstones seen, however, there is no No evidence of cholecystitis or biliary ductal dilatation. Pancreas:  No mass or inflammatory changes. Spleen: Within normal limits in  size and appearance. Adrenals/Urinary Tract: Severe bilateral diffuse renal parenchymal atrophy again seen, consistent with end-stage renal disease. No suspicious masses identified. No evidence of ureteral calculi or hydronephrosis. Diffuse bladder wall thickening again noted, which may be due to chronic bladder outlet obstruction or cystitis. Stomach/Bowel: No evidence of obstruction, inflammatory process or abnormal fluid collections. Vascular/Lymphatic: No pathologically enlarged lymph nodes. No acute vascular findings. Reproductive:  No mass or other significant abnormality. Other:  None. Musculoskeletal:  No suspicious bone lesions identified. IMPRESSION: No acute findings. Stable diffuse bladder wall thickening, which may be due to chronic bladder outlet obstruction or cystitis. Recommend correlation with urinalysis. Probable tiny gallstones. No radiographic evidence of cholecystitis. Chronic severe bilateral renal atrophy, consistent with end-stage renal disease. No evidence of hydronephrosis. Electronically Signed   By: Jonny Ruiz  Geanie Cooley M.D.   On: 01/08/2023 17:26   DG Chest 2 View  Result Date: 01/06/2023 CLINICAL DATA:  Shortness of breath.  Right leg pain. EXAM: CHEST - 2 VIEW COMPARISON:  11/16/2022. FINDINGS: Unchanged cardiac silhouette and mediastinal contours post median sternotomy and CABG. Atherosclerotic plaque within thoracic aorta. Stable positioning of support apparatus. No pneumothorax. No evidence of edema. No acute osseous abnormalities. Moderate scoliotic curvature of the thoracolumbar spine, incompletely evaluated. IMPRESSION: No acute cardiopulmonary disease. Electronically Signed   By: Simonne Come M.D.   On: 01/06/2023 12:25   US Venous Img Lower Unilateral Right  Result Date: 01/06/2023 CLINICAL DATA:  Right lower extremity pain.  Evaluate for DVT. EXAM: RIGHT LOWER EXTREMITY VENOUS DOPPLER ULTRASOUND TECHNIQUE: Gray-scale sonography with graded compression, as well as color  Doppler and duplex ultrasound were performed to evaluate the lower extremity deep venous systems from the level of the common femoral vein and including the common femoral, femoral, profunda femoral, popliteal and calf veins including the posterior tibial, peroneal and gastrocnemius veins when visible. The superficial great saphenous vein was also interrogated. Spectral Doppler was utilized to evaluate flow at rest and with distal augmentation maneuvers in the common femoral, femoral and popliteal veins. COMPARISON:  None Available. FINDINGS: Contralateral Common Femoral Vein: Respiratory phasicity is normal and symmetric with the symptomatic side. No evidence of thrombus. Normal compressibility. Common Femoral Vein: No evidence of thrombus. Normal compressibility, respiratory phasicity and response to augmentation. Saphenofemoral Junction: No evidence of thrombus. Normal compressibility and flow on color Doppler imaging. Profunda Femoral Vein: No evidence of thrombus. Normal compressibility and flow on color Doppler imaging. Femoral Vein: No evidence of thrombus. Normal compressibility, respiratory phasicity and response to augmentation. Popliteal Vein: No evidence of thrombus. Normal compressibility, respiratory phasicity and response to augmentation. Calf Veins: No evidence of thrombus. Normal compressibility and flow on color Doppler imaging. Superficial Great Saphenous Vein: No evidence of thrombus. Normal compressibility. Other Findings: An arterial stent is incidentally noted within the right superficial femoral artery, the patency of which is not evaluated on this examination. IMPRESSION: No evidence of DVT within the right lower extremity. Electronically Signed   By: Simonne Come M.D.   On: 01/06/2023 12:16     Assessment/Plan 1. Atherosclerosis of native artery of right lower extremity with rest pain [facility]) Recommend:  The patient is status post successful angiogram with intervention.  The patient  reports that the claudication symptoms and leg pain has improved.   The patient denies lifestyle limiting changes at this point in time.  No further invasive studies, angiography or surgery at this time. The patient should continue walking and begin a more formal exercise program.  The patient should continue antiplatelet therapy and aggressive treatment of the lipid abnormalities  Continued surveillance is indicated as atherosclerosis is likely to progress with time.    Patient should undergo noninvasive studies as ordered. The patient will follow up with me to review the studies.  - VAS [place] ABI WITH/WO TBI; Future  2. Infrarenal abdominal aortic aneurysm (AAA) without rupture (HCC) Recommend: No surgery or intervention is indicated at this time.  The patient has an asymptomatic abdominal aortic aneurysm that is less than 4 cm in maximal diameter.    I have reviewed the natural history of abdominal aortic aneurysm and the small risk of rupture for aneurysm less than 5 cm in size.  However, as these small aneurysms tend to enlarge over time, continued surveillance with ultrasound or CT scan is mandatory.  I have also discussed optimizing medical management with hypertension and lipid control and the negative effect that any tobacco products have on aneurysmal disease.  The patient is also encouraged to exercise a minimum of 30 minutes 4 times a week.   Should the patient develop new onset abdominal or back pain or signs of peripheral embolization they are instructed to seek medical attention immediately and to alert the physician providing care that they have an aneurysm.   The patient voices their understanding.  The patient will return in 12 months with an aortic duplex.  3. Atrial fibrillation, chronic (HCC) Continue antiarrhythmia medications as already ordered, these medications have been reviewed and there are no changes at this time.  Continue anticoagulation as ordered by  Cardiology Service  4. Essential hypertension Continue antihypertensive medications as already ordered, these medications have been reviewed and there are no changes at this time.  5. Coronary artery disease involving native coronary artery of native heart with angina pectoris (HCC) Continue cardiac and antihypertensive medications as already ordered and reviewed, no changes at this time.  Continue statin as ordered and reviewed, no changes at this time  Nitrates PRN for chest pain  6. End stage renal disease (HCC) Recommend:  The patient is doing well and currently has adequate dialysis access. The patient's dialysis center is not reporting any access issues. Flow pattern is stable when compared to the prior ultrasound.  The patient should have a duplex ultrasound of the dialysis access in 6 months. The patient will follow-up with me in the office after each ultrasound      [name], MD  01/30/2023 1:32 PM

## 2023-02-01 LAB — VAS US ABI WITH/WO TBI
Left ABI: 0.98
Right ABI: 0.76

## 2023-02-28 ENCOUNTER — Telehealth (INDEPENDENT_AMBULATORY_CARE_PROVIDER_SITE_OTHER): Payer: Self-pay

## 2023-02-28 NOTE — Telephone Encounter (Signed)
 When Dr. Gilda Crease saw him a month ago he had good flow in the left leg, if it's bothering him he can come in for ABIs to see if there has been a change.

## 2023-02-28 NOTE — Telephone Encounter (Signed)
 Patient has sharp pains in his left leg. It's been bothering him for 2 weeks now, he stated it feels like the right one did when he had a blockage and had to get stents.   Please advise

## 2023-03-01 NOTE — Telephone Encounter (Signed)
 I tried calling 2 times after lunch and I was unable to get him.

## 2023-03-02 NOTE — Telephone Encounter (Signed)
 I tried to reach Anthony Hess this morning for an appointment, but I wasn't able to reach him or leave a voicemail.

## 2023-03-03 ENCOUNTER — Encounter (INDEPENDENT_AMBULATORY_CARE_PROVIDER_SITE_OTHER): Payer: Self-pay | Admitting: Nurse Practitioner

## 2023-03-03 ENCOUNTER — Ambulatory Visit (INDEPENDENT_AMBULATORY_CARE_PROVIDER_SITE_OTHER): Payer: Medicare Other | Admitting: Nurse Practitioner

## 2023-03-03 ENCOUNTER — Other Ambulatory Visit (INDEPENDENT_AMBULATORY_CARE_PROVIDER_SITE_OTHER): Payer: Self-pay | Admitting: Vascular Surgery

## 2023-03-03 ENCOUNTER — Ambulatory Visit (INDEPENDENT_AMBULATORY_CARE_PROVIDER_SITE_OTHER): Payer: Medicare Other

## 2023-03-03 VITALS — BP 81/60 | HR 96 | Wt 115.0 lb

## 2023-03-03 DIAGNOSIS — I7143 Infrarenal abdominal aortic aneurysm, without rupture: Secondary | ICD-10-CM

## 2023-03-03 DIAGNOSIS — I25119 Atherosclerotic heart disease of native coronary artery with unspecified angina pectoris: Secondary | ICD-10-CM

## 2023-03-03 DIAGNOSIS — N186 End stage renal disease: Secondary | ICD-10-CM

## 2023-03-03 DIAGNOSIS — E785 Hyperlipidemia, unspecified: Secondary | ICD-10-CM | POA: Diagnosis not present

## 2023-03-03 DIAGNOSIS — I70221 Atherosclerosis of native arteries of extremities with rest pain, right leg: Secondary | ICD-10-CM

## 2023-03-03 DIAGNOSIS — I10 Essential (primary) hypertension: Secondary | ICD-10-CM | POA: Diagnosis not present

## 2023-03-03 DIAGNOSIS — I70212 Atherosclerosis of native arteries of extremities with intermittent claudication, left leg: Secondary | ICD-10-CM | POA: Diagnosis not present

## 2023-03-03 DIAGNOSIS — I482 Chronic atrial fibrillation, unspecified: Secondary | ICD-10-CM

## 2023-03-03 NOTE — H&P (View-Only) (Signed)
 Subjective:    Patient ID: Tennessee Perra, male    DOB: 09-06-1955, 68 y.o.   MRN: [id number] Chief Complaint  Patient presents with   Follow-up    Add on    The patient returns to the office for followup of atherosclerotic changes of the lower extremities and review of the noninvasive studies.   The patient notes that there has been a significant deterioration in the lower extremity symptoms.  The patient notes interval shortening of their claudication distance and development of mild rest pain symptoms. No new ulcers or wounds have occurred since the last visit.  There have been no significant changes to the patient's overall health care.  The patient denies amaurosis fugax or recent TIA symptoms. There are no recent neurological changes noted. There is no history of DVT, PE or superficial thrombophlebitis. The patient denies recent episodes of angina or shortness of breath.   Today arterial duplex shows occlusion of the proximal to distal SFA as well as the distal tibial vessels.  He has some collaterals feeding his distal tibials but the flow is very dampened.  Currently the ABI on the left lower extremity is 0.17    Review of Systems  Musculoskeletal:  Positive for gait problem.  All other systems reviewed and are negative.      Objective:   Physical Exam Vitals reviewed.  HENT:     Head: Normocephalic.  Cardiovascular:     Rate and Rhythm: Normal rate.     Pulses:          Dorsalis pedis pulses are 0 on the left side.       Posterior tibial pulses are 0 on the left side.  Pulmonary:     Effort: Pulmonary effort is normal.  Skin:    General: Skin is warm and dry.  Neurological:     Mental Status: He is alert and oriented to person, place, and time.  Psychiatric:        Mood and Affect: Mood normal.        Behavior: Behavior normal.        Thought Content: Thought content normal.        Judgment: Judgment normal.     BP (!) 81/60   Pulse 96   Wt 115 lb  (52.2 kg)   BMI 21.03 kg/m   Past Medical History:  Diagnosis Date   Aortic atherosclerosis (HCC)    Bilateral carotid artery disease (HCC)    Bladder cancer (HCC)    Coronary artery disease 12/20/2018   a.) LHC 12/20/2018: 50% OM1, 40% OM2, 95% o-pLAD, 75% o=pLCx, 40% mLM, 70% D1, 60% mRCA-1, 50% mRCA-2; refer to CVTS. b.) 4v CABG at [facility] on 12/27/2018: LIMA-LAD, RIMA-PDA, seg LRA-OM1-D1   DCM (dilated cardiomyopathy) (HCC) 12/05/2018   a.) TTE 12/05/2018: EF 40-45%. b.) TTE 12/28/2019: EF 20-25%.   Dyspnea 10/15/2022   ESRD (end stage renal disease) (HCC)    a.) T-Th-Sat   HFrEF (heart failure with reduced ejection fraction) (HCC) 12/05/2018   a.) TTE 12/05/2018: EF 40-45%; mild LVH; ant/apical/sep HK; mild TR . b.) TTE 12/28/2019: EF 20-25%; mod LVH; mod MR/AR; G1DD.   History of 2019 novel coronavirus disease (COVID-19) 04/08/2021   History of kidney stones    HLD (hyperlipidemia)    Hx of CABG 12/27/2018   Hypertension    Infrarenal abdominal aortic aneurysm (AAA) without rupture (HCC) 03/05/2021   a.) CT abd/pelvis; measured 3.2 cm.   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   a.) LIMA-LAD, RIMA-PDA, sequential LEFT radial artery to OM1 and D1   Sepsis (HCC) 03/14/2021   Wears glasses     Social History   Socioeconomic History   Marital status: Married    Spouse name: Not on file   Number of children: Not on file   Years of education: Not on file   Highest education level: Not on file  Occupational History   Not on file  Tobacco Use   Smoking status: Former    Types: Cigarettes    Start date: 12/2022    Quit date: 02/19/2019    Years since quitting: 4.0   Smokeless tobacco: Never  Vaping Use   Vaping status: Never Used  Substance and Sexual Activity   Alcohol use: Never   Drug  use: Never   Sexual activity: Yes  Other Topics Concern   Not on file  Social History Narrative   Not on file   Social Drivers of Health   Financial Resource Strain: Low Risk  (08/23/2022)   Overall Financial Resource Strain (CARDIA)    Difficulty of Paying Living Expenses: Not very hard  Food Insecurity: No Food Insecurity (01/11/2023)   Hunger Vital Sign    Worried About Running Out of Food in the Last Year: Never true    Ran Out of Food in the Last Year: Never true  Recent Concern: Food Insecurity - Food Insecurity Present (11/16/2022)   Hunger Vital Sign    Worried About Running Out of Food in the Last Year: Often true    Ran Out of Food in the Last Year: Often true  Transportation Needs: No Transportation Needs (12/21/2022)   PRAPARE - Administrator, Civil Service (Medical): No    Lack of Transportation (Non-Medical): No  Recent Concern: Transportation Needs - Unmet Transportation Needs (11/16/2022)   PRAPARE - Transportation    Lack of Transportation (Medical): Yes    Lack of Transportation (Non-Medical): Yes  Physical Activity: Inactive (08/23/2022)   Exercise Vital Sign    Days of Exercise per Week: 0 days    Minutes of Exercise per Session: 0 min  Stress: No Stress Concern Present (08/23/2022)   [employer] of Occupational Health - Occupational Stress Questionnaire    Feeling of Stress : Not at all  Social Connections: Moderately Integrated (08/23/2022)   Social Connection and Isolation Panel [NHANES]    Frequency of Communication with Friends and Family: More than three times a week    Frequency of Social Gatherings with Friends and Family: Never    Attends Religious Services: More than 4 times per year    Active Member of [employer] or Organizations: No    Attends Banker Meetings: Never    Marital Status: Married  Catering Manager Violence: Not At Risk (12/21/2022)   Humiliation, Afraid, Rape, and Kick questionnaire    Fear of Current or  Ex-Partner: No    Emotionally Abused: No    Physically Abused: No    Sexually Abused: No    Past Surgical History:  Procedure Laterality Date   AV FISTULA PLACEMENT Left 07/30/2021   Procedure: INSERTION OF ARTERIOVENOUS (AV) GORE-TEX GRAFT ARM BRACHIAL ARTERY TO AXILLARY VEIN;  Surgeon: Marea Selinda RAMAN, MD;  Location: [facility];  Service: Vascular;  Laterality: Left;  CAPD INSERTION N/A 12/31/2019   Procedure: LAPAROSCOPIC INSERTION CONTINUOUS AMBULATORY PERITONEAL DIALYSIS  (CAPD) CATHETER;  Surgeon: Jordis Laneta FALCON, MD;  Location: [facility];  Service: General;  Laterality: N/A;   CAPD REMOVAL N/A 04/10/2020   Procedure: LAPAROSCOPIC REVISION OF CONTINUOUS AMBULATORY PERITONEAL DIALYSIS  (CAPD) CATHETER;  Surgeon: Jordis Laneta FALCON, MD;  Location: [facility];  Service: General;  Laterality: N/A;   CORONARY ARTERY BYPASS GRAFT N/A 12/27/2018   Procedure: CORONARY ARTERY BYPASS GRAFTING (CABG) X 4 ON PUMP USING RIGHT & LEFT INTERNAL MAMMARY ARTERY LEFT RADIAL ARTERY ENDOSCOPICALLY HARVESTED;  Surgeon: German Bartlett PEDLAR, MD;  [facility]: [facility];  Service: Open Heart Surgery;  Laterality: N/A;   CYSTOSCOPY W/ RETROGRADES Bilateral 05/15/2019   Procedure: CYSTOSCOPY WITH RETROGRADE PYELOGRAM;  Surgeon: Twylla Glendia BROCKS, MD;  Location: [facility];  Service: Urology;  Laterality: Bilateral;   CYSTOSCOPY WITH BIOPSY N/A 05/15/2019   Procedure: CYSTOSCOPY WITH bladder BIOPSY;  Surgeon: Twylla Glendia BROCKS, MD;  Location: [facility];  Service: Urology;  Laterality: N/A;   DIALYSIS/PERMA CATHETER INSERTION N/A 12/28/2019   Procedure: DIALYSIS/PERMA CATHETER INSERTION;  Surgeon: Marea Selinda RAMAN, MD;  Location: [facility];  Service: Cardiovascular;  Laterality: N/A;   DIALYSIS/PERMA CATHETER INSERTION N/A 03/18/2021   Procedure: DIALYSIS/PERMA CATHETER INSERTION;  Surgeon: Marea Selinda RAMAN, MD;  Location: [facility];  Service: Cardiovascular;  Laterality: N/A;   DIALYSIS/PERMA CATHETER REMOVAL N/A 06/02/2020    Procedure: DIALYSIS/PERMA CATHETER REMOVAL;  Surgeon: Marea Selinda RAMAN, MD;  Location: [facility];  Service: Cardiovascular;  Laterality: N/A;   DIALYSIS/PERMA CATHETER REPAIR N/A 10/22/2022   Procedure: DIALYSIS/PERMA CATHETER REPAIR;  Surgeon: Jama Cordella MATSU, MD;  Location: [facility];  Service: Cardiovascular;  Laterality: N/A;   ESOPHAGOGASTRODUODENOSCOPY N/A 01/09/2023   Procedure: ESOPHAGOGASTRODUODENOSCOPY (EGD);  Surgeon: Toledo, Ladell POUR, MD;  Location: [facility];  Service: Gastroenterology;  Laterality: N/A;   EXCHANGE OF A DIALYSIS CATHETER Right 04/10/2020   Procedure: EXCHANGE OF A DIALYSIS CATHETER;  Surgeon: Jordis Laneta FALCON, MD;  Location: [facility];  Service: General;  Laterality: Right;   HEMOSTASIS CONTROL  01/09/2023   Procedure: HEMOSTASIS CONTROL;  Surgeon: Aundria, Ladell POUR, MD;  Location: [facility];  Service: Gastroenterology;;   SHIRLENE HERNIA REPAIR  01/20/2021   Procedure: HERNIA REPAIR INCISIONAL;  Surgeon: Desiderio Schanz, MD;  Location: [facility];  Service: General;;   IR IMAGE GUIDED DRAINAGE PERCUT CATH  PERITONEAL RETROPERIT  04/07/2020   LAPAROSCOPY N/A 04/16/2021   Procedure: LAPAROSCOPY DIAGNOSTIC;  Surgeon: Tye Millet, DO;  Location: [facility];  Service: General;  Laterality: N/A;   LAPAROTOMY N/A 04/26/2022   Procedure: EXPLORATORY LAPAROTOMY WITH REPAIR OF DUODENAL ULCER;  Surgeon: Rodolph Romano, MD;  Location: [facility];  Service: General;  Laterality: N/A;   LEFT HEART CATH AND CORONARY ANGIOGRAPHY Left 12/20/2018   Procedure: LEFT HEART CATH AND CORONARY ANGIOGRAPHY;  Surgeon: Ammon Blunt, MD;  Location: [facility];  Service: Cardiovascular;  Laterality: Left;   LEFT HEART CATH AND CORONARY ANGIOGRAPHY N/A 01/12/2023   Procedure: LEFT HEART CATH AND CORONARY ANGIOGRAPHY;  Surgeon: Ammon Blunt, MD;  Location: [facility];  Service: Cardiovascular;  Laterality: N/A;   LOWER EXTREMITY  ANGIOGRAPHY Right 12/23/2022   Procedure: Lower Extremity Angiography;  Surgeon: Marea Selinda RAMAN, MD;  Location: [facility];  Service: Cardiovascular;  Laterality: Right;   RADIAL ARTERY HARVEST Left 12/27/2018   Procedure: ENDOSCOPIC RADIAL ARTERY HARVEST;  Surgeon: German Bartlett  Z, MD;  Location: [facility];  Service: Open Heart Surgery;  Laterality: Left;   REMOVAL OF A DIALYSIS CATHETER Left 03/20/2021   Procedure: REMOVAL OF A PD CATHETER;  Surgeon: Marea Selinda RAMAN, MD;  Location: [facility];  Service: Vascular;  Laterality: Left;   REVISION OF ARTERIOVENOUS GORETEX GRAFT Left 09/11/2021   Procedure: Excisionof infected AV graft;  Surgeon: Jama Cordella MATSU, MD;  Location: [facility];  Service: Vascular;  Laterality: Left;   SUBMUCOSAL INJECTION  01/09/2023   Procedure: SUBMUCOSAL INJECTION;  Surgeon: Toledo, Ladell POUR, MD;  Location: [facility] ENDOSCOPY;  Service: Gastroenterology;;   TEE WITHOUT CARDIOVERSION N/A 12/27/2018   Procedure: TRANSESOPHAGEAL ECHOCARDIOGRAM (TEE);  Surgeon: German Bartlett PEDLAR, MD;  Location: [facility] OR;  Service: Open Heart Surgery;  Laterality: N/A;   TUMOR REMOVAL  2019   Bladder    Family History  Problem Relation Age of Onset   Heart failure Mother     No Known Allergies     Latest Ref Rng & Units 01/13/2023    3:43 AM 01/12/2023    5:39 AM 01/11/2023    2:28 PM  CBC  WBC 4.0 - 10.5 K/uL 14.5  15.5  13.7   Hemoglobin 13.0 - 17.0 g/dL 8.3  9.1  9.6   Hematocrit 39.0 - 52.0 % 25.9  28.3  29.4   Platelets 150 - 400 K/uL 384  366  360       CMP     Component Value Date/Time   NA 136 01/13/2023 0343   NA 141 06/25/2022 1414   K 4.4 01/13/2023 0343   CL 100 01/13/2023 0343   CO2 21 (L) 01/13/2023 0343   GLUCOSE 138 (H) 01/13/2023 0343   BUN 78 (H) 01/13/2023 0343   BUN 39 (H) 06/25/2022 1414   CREATININE 7.03 (H) 01/13/2023 0343   CREATININE 4.62 (H) 01/10/2019 1351   CALCIUM  6.7 (L) 01/13/2023 0343   PROT 5.2 (L) 01/08/2023 1324   PROT 6.8  06/25/2022 1414   ALBUMIN  2.6 (L) 01/11/2023 1428   ALBUMIN  4.4 06/25/2022 1414   AST 28 01/08/2023 1324   ALT 17 01/08/2023 1324   ALKPHOS 45 01/08/2023 1324   BILITOT 0.2 01/08/2023 1324   BILITOT <0.2 06/25/2022 1414   EGFR 8 (L) 06/25/2022 1414   GFRNONAA 8 (L) 01/13/2023 0343     VAS US  ABI WITH/WO TBI Result Date: 02/01/2023  LOWER EXTREMITY DOPPLER STUDY Patient Name:  Chadd Tollison  Date of Exam:   01/27/2023 Medical Rec #: [id number]     Accession #:    [id number] Date of Birth: 10/03/1955    Patient Gender: M Patient Age:   25 years Exam Location:  [facility] Procedure:      VAS US  ABI WITH/WO TBI Referring Phys: Cordella Jama --------------------------------------------------------------------------------  Indications: Rest pain, and peripheral artery disease.  Vascular Interventions: 12/23/2022: Aortogram and Selective Right Lower                         Extremity Angiogram. PTA of the Right Posterior Tibial                         Artery and Tibioperoneal trunk with 2 mm diameter by 30                         cm length angioplasty balloon. PTA of the Right SFA and  Popliteal Artery with 5 mm diameter by 30 cm length                         angioplasty balloon. Stent placement to the Right SFA                         and Popliteal Artery with 6 mm diameter by 25 cm length                         and 6 mm diameter by 15 cm length Viabahn stents. Comparison Study: 12/15/2022 Performing Technologist: Leafy Gibes RVS  Examination Guidelines: A complete evaluation includes at minimum, Doppler waveform signals and systolic blood pressure reading at the level of bilateral brachial, anterior tibial, and posterior tibial arteries, when vessel segments are accessible. Bilateral testing is considered an integral part of a complete examination. Photoelectric Plethysmograph (PPG) waveforms and toe systolic pressure readings are included as required and additional  duplex testing as needed. Limited examinations for reoccurring indications may be performed as noted.  ABI Findings: +---------+------------------+-----+----------+--------+ Right    Rt Pressure (mmHg)IndexWaveform  Comment  +---------+------------------+-----+----------+--------+ Brachial 106                                       +---------+------------------+-----+----------+--------+ ATA      81                0.76 monophasic         +---------+------------------+-----+----------+--------+ PTA      73                0.68 monophasic         +---------+------------------+-----+----------+--------+ Great Toe63                0.59 Abnormal           +---------+------------------+-----+----------+--------+ +---------+------------------+-----+--------+-------+ Left     Lt Pressure (mmHg)IndexWaveformComment +---------+------------------+-----+--------+-------+ Brachial 107                                    +---------+------------------+-----+--------+-------+ ATA      105               0.98 biphasic        +---------+------------------+-----+--------+-------+ PTA      88                0.82 biphasic        +---------+------------------+-----+--------+-------+ Great Toe91                0.85 Normal          +---------+------------------+-----+--------+-------+ +-------+-----------+-----------+------------+------------+ ABI/TBIToday's ABIToday's TBIPrevious ABIPrevious TBI +-------+-----------+-----------+------------+------------+ Right  .76        .59        00          0            +-------+-----------+-----------+------------+------------+ Left   .98        .85        1.01        0            +-------+-----------+-----------+------------+------------+ Right TBIs and ABIs appear increased compared to prior study on 12/15/2022. Left ABIs appear essentially unchanged compared to prior study on 12/15/2022. Lt TBIs appear  to be increased  compared to prior study on 12/15/2022.  Summary: Right: Resting right ankle-brachial index indicates moderate right lower extremity arterial disease. The right toe-brachial index is abnormal. Left: Resting left ankle-brachial index is within normal range. The left toe-brachial index is normal. *See table(s) above for measurements and observations.  Electronically signed by Cordella Shawl MD on 02/01/2023 at 9:35:48 AM.    Final        Assessment & Plan:   1. 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  Given the severity of the patient's severe left lower extremity symptoms the patient should undergo angiography with the hope for intervention.  Risk and benefits were reviewed the patient.  Indications for the procedure were reviewed.  All questions were answered, the patient agrees to proceed with left lower extremity angiography and possible intervention.   The patient should continue walking and begin a more formal exercise program.  The patient should continue antiplatelet therapy and aggressive treatment of the lipid abnormalities  The patient will follow up with me after the angiogram.   2. Primary hypertension Continue antihypertensive medications as already ordered, these medications have been reviewed and there are no changes at this time.  3. Hyperlipidemia, unspecified hyperlipidemia type Continue statin as ordered and reviewed, no changes at this time   Current Outpatient Medications on File Prior to Visit  Medication Sig Dispense Refill   apixaban  (ELIQUIS ) 2.5 MG TABS tablet Take 1 tablet (2.5 mg total) by mouth 2 (two) times daily. 60 tablet 0   aspirin  EC 81 MG tablet Take 81 mg by mouth daily.     atorvastatin  (LIPITOR ) 20 MG tablet Take  1 tablet (20 mg total) by mouth daily. 30 tablet 11   isosorbide  dinitrate (ISORDIL ) 10 MG tablet Take 1 tablet (10 mg total) by mouth 3 (three) times daily. 90 tablet 0   sevelamer  carbonate (RENVELA ) 800 MG tablet Take 1,600 mg by mouth 3 (three) times daily.     traMADol  (ULTRAM ) 50 MG tablet Take 1 tablet (50 mg total) by mouth every 12 (twelve) hours as needed for severe pain (pain score 7-10). 36 tablet 0   pantoprazole  (PROTONIX ) 40 MG tablet Take 1 tablet (40 mg total) by mouth 2 (two) times daily before a meal. 60 tablet 0   No current facility-administered medications on file prior to visit.    There are no Patient Instructions on file for this visit. No follow-ups on file.   Kaymen Adrian E Izola Teague, NP

## 2023-03-03 NOTE — Progress Notes (Signed)
 Subjective:    Patient ID: Anthony Hess, male    DOB: 09-06-1955, 68 y.o.   MRN: [id number] Chief Complaint  Patient presents with   Follow-up    Add on    The patient returns to the office for followup of atherosclerotic changes of the lower extremities and review of the noninvasive studies.   The patient notes that there has been a significant deterioration in the lower extremity symptoms.  The patient notes interval shortening of their claudication distance and development of mild rest pain symptoms. No new ulcers or wounds have occurred since the last visit.  There have been no significant changes to the patient's overall health care.  The patient denies amaurosis fugax or recent TIA symptoms. There are no recent neurological changes noted. There is no history of DVT, PE or superficial thrombophlebitis. The patient denies recent episodes of angina or shortness of breath.   Today arterial duplex shows occlusion of the proximal to distal SFA as well as the distal tibial vessels.  He has some collaterals feeding his distal tibials but the flow is very dampened.  Currently the ABI on the left lower extremity is 0.17    Review of Systems  Musculoskeletal:  Positive for gait problem.  All other systems reviewed and are negative.      Objective:   Physical Exam Vitals reviewed.  HENT:     Head: Normocephalic.  Cardiovascular:     Rate and Rhythm: Normal rate.     Pulses:          Dorsalis pedis pulses are 0 on the left side.       Posterior tibial pulses are 0 on the left side.  Pulmonary:     Effort: Pulmonary effort is normal.  Skin:    General: Skin is warm and dry.  Neurological:     Mental Status: He is alert and oriented to person, place, and time.  Psychiatric:        Mood and Affect: Mood normal.        Behavior: Behavior normal.        Thought Content: Thought content normal.        Judgment: Judgment normal.     BP (!) 81/60   Pulse 96   Wt 115 lb  (52.2 kg)   BMI 21.03 kg/m   Past Medical History:  Diagnosis Date   Aortic atherosclerosis (HCC)    Bilateral carotid artery disease (HCC)    Bladder cancer (HCC)    Coronary artery disease 12/20/2018   a.) LHC 12/20/2018: 50% OM1, 40% OM2, 95% o-pLAD, 75% o=pLCx, 40% mLM, 70% D1, 60% mRCA-1, 50% mRCA-2; refer to CVTS. b.) 4v CABG at [facility] on 12/27/2018: LIMA-LAD, RIMA-PDA, seg LRA-OM1-D1   DCM (dilated cardiomyopathy) (HCC) 12/05/2018   a.) TTE 12/05/2018: EF 40-45%. b.) TTE 12/28/2019: EF 20-25%.   Dyspnea 10/15/2022   ESRD (end stage renal disease) (HCC)    a.) T-Th-Sat   HFrEF (heart failure with reduced ejection fraction) (HCC) 12/05/2018   a.) TTE 12/05/2018: EF 40-45%; mild LVH; ant/apical/sep HK; mild TR . b.) TTE 12/28/2019: EF 20-25%; mod LVH; mod MR/AR; G1DD.   History of 2019 novel coronavirus disease (COVID-19) 04/08/2021   History of kidney stones    HLD (hyperlipidemia)    Hx of CABG 12/27/2018   Hypertension    Infrarenal abdominal aortic aneurysm (AAA) without rupture (HCC) 03/05/2021   a.) CT abd/pelvis; measured 3.2 cm.   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   a.) LIMA-LAD, RIMA-PDA, sequential LEFT radial artery to OM1 and D1   Sepsis (HCC) 03/14/2021   Wears glasses     Social History   Socioeconomic History   Marital status: Married    Spouse name: Not on file   Number of children: Not on file   Years of education: Not on file   Highest education level: Not on file  Occupational History   Not on file  Tobacco Use   Smoking status: Former    Types: Cigarettes    Start date: 12/2022    Quit date: 02/19/2019    Years since quitting: 4.0   Smokeless tobacco: Never  Vaping Use   Vaping status: Never Used  Substance and Sexual Activity   Alcohol use: Never   Drug  use: Never   Sexual activity: Yes  Other Topics Concern   Not on file  Social History Narrative   Not on file   Social Drivers of Health   Financial Resource Strain: Low Risk  (08/23/2022)   Overall Financial Resource Strain (CARDIA)    Difficulty of Paying Living Expenses: Not very hard  Food Insecurity: No Food Insecurity (01/11/2023)   Hunger Vital Sign    Worried About Running Out of Food in the Last Year: Never true    Ran Out of Food in the Last Year: Never true  Recent Concern: Food Insecurity - Food Insecurity Present (11/16/2022)   Hunger Vital Sign    Worried About Running Out of Food in the Last Year: Often true    Ran Out of Food in the Last Year: Often true  Transportation Needs: No Transportation Needs (12/21/2022)   PRAPARE - Administrator, Civil Service (Medical): No    Lack of Transportation (Non-Medical): No  Recent Concern: Transportation Needs - Unmet Transportation Needs (11/16/2022)   PRAPARE - Transportation    Lack of Transportation (Medical): Yes    Lack of Transportation (Non-Medical): Yes  Physical Activity: Inactive (08/23/2022)   Exercise Vital Sign    Days of Exercise per Week: 0 days    Minutes of Exercise per Session: 0 min  Stress: No Stress Concern Present (08/23/2022)   [employer] of Occupational Health - Occupational Stress Questionnaire    Feeling of Stress : Not at all  Social Connections: Moderately Integrated (08/23/2022)   Social Connection and Isolation Panel [NHANES]    Frequency of Communication with Friends and Family: More than three times a week    Frequency of Social Gatherings with Friends and Family: Never    Attends Religious Services: More than 4 times per year    Active Member of [employer] or Organizations: No    Attends Banker Meetings: Never    Marital Status: Married  Catering Manager Violence: Not At Risk (12/21/2022)   Humiliation, Afraid, Rape, and Kick questionnaire    Fear of Current or  Ex-Partner: No    Emotionally Abused: No    Physically Abused: No    Sexually Abused: No    Past Surgical History:  Procedure Laterality Date   AV FISTULA PLACEMENT Left 07/30/2021   Procedure: INSERTION OF ARTERIOVENOUS (AV) GORE-TEX GRAFT ARM BRACHIAL ARTERY TO AXILLARY VEIN;  Surgeon: Marea Selinda RAMAN, MD;  Location: [facility];  Service: Vascular;  Laterality: Left;  CAPD INSERTION N/A 12/31/2019   Procedure: LAPAROSCOPIC INSERTION CONTINUOUS AMBULATORY PERITONEAL DIALYSIS  (CAPD) CATHETER;  Surgeon: Jordis Laneta FALCON, MD;  Location: [facility];  Service: General;  Laterality: N/A;   CAPD REMOVAL N/A 04/10/2020   Procedure: LAPAROSCOPIC REVISION OF CONTINUOUS AMBULATORY PERITONEAL DIALYSIS  (CAPD) CATHETER;  Surgeon: Jordis Laneta FALCON, MD;  Location: [facility];  Service: General;  Laterality: N/A;   CORONARY ARTERY BYPASS GRAFT N/A 12/27/2018   Procedure: CORONARY ARTERY BYPASS GRAFTING (CABG) X 4 ON PUMP USING RIGHT & LEFT INTERNAL MAMMARY ARTERY LEFT RADIAL ARTERY ENDOSCOPICALLY HARVESTED;  Surgeon: German Bartlett PEDLAR, MD;  [facility]: [facility];  Service: Open Heart Surgery;  Laterality: N/A;   CYSTOSCOPY W/ RETROGRADES Bilateral 05/15/2019   Procedure: CYSTOSCOPY WITH RETROGRADE PYELOGRAM;  Surgeon: Twylla Glendia BROCKS, MD;  Location: [facility];  Service: Urology;  Laterality: Bilateral;   CYSTOSCOPY WITH BIOPSY N/A 05/15/2019   Procedure: CYSTOSCOPY WITH bladder BIOPSY;  Surgeon: Twylla Glendia BROCKS, MD;  Location: [facility];  Service: Urology;  Laterality: N/A;   DIALYSIS/PERMA CATHETER INSERTION N/A 12/28/2019   Procedure: DIALYSIS/PERMA CATHETER INSERTION;  Surgeon: Marea Selinda RAMAN, MD;  Location: [facility];  Service: Cardiovascular;  Laterality: N/A;   DIALYSIS/PERMA CATHETER INSERTION N/A 03/18/2021   Procedure: DIALYSIS/PERMA CATHETER INSERTION;  Surgeon: Marea Selinda RAMAN, MD;  Location: [facility];  Service: Cardiovascular;  Laterality: N/A;   DIALYSIS/PERMA CATHETER REMOVAL N/A 06/02/2020    Procedure: DIALYSIS/PERMA CATHETER REMOVAL;  Surgeon: Marea Selinda RAMAN, MD;  Location: [facility];  Service: Cardiovascular;  Laterality: N/A;   DIALYSIS/PERMA CATHETER REPAIR N/A 10/22/2022   Procedure: DIALYSIS/PERMA CATHETER REPAIR;  Surgeon: Jama Cordella MATSU, MD;  Location: [facility];  Service: Cardiovascular;  Laterality: N/A;   ESOPHAGOGASTRODUODENOSCOPY N/A 01/09/2023   Procedure: ESOPHAGOGASTRODUODENOSCOPY (EGD);  Surgeon: Toledo, Ladell POUR, MD;  Location: [facility];  Service: Gastroenterology;  Laterality: N/A;   EXCHANGE OF A DIALYSIS CATHETER Right 04/10/2020   Procedure: EXCHANGE OF A DIALYSIS CATHETER;  Surgeon: Jordis Laneta FALCON, MD;  Location: [facility];  Service: General;  Laterality: Right;   HEMOSTASIS CONTROL  01/09/2023   Procedure: HEMOSTASIS CONTROL;  Surgeon: Aundria, Ladell POUR, MD;  Location: [facility];  Service: Gastroenterology;;   SHIRLENE HERNIA REPAIR  01/20/2021   Procedure: HERNIA REPAIR INCISIONAL;  Surgeon: Desiderio Schanz, MD;  Location: [facility];  Service: General;;   IR IMAGE GUIDED DRAINAGE PERCUT CATH  PERITONEAL RETROPERIT  04/07/2020   LAPAROSCOPY N/A 04/16/2021   Procedure: LAPAROSCOPY DIAGNOSTIC;  Surgeon: Tye Millet, DO;  Location: [facility];  Service: General;  Laterality: N/A;   LAPAROTOMY N/A 04/26/2022   Procedure: EXPLORATORY LAPAROTOMY WITH REPAIR OF DUODENAL ULCER;  Surgeon: Rodolph Romano, MD;  Location: [facility];  Service: General;  Laterality: N/A;   LEFT HEART CATH AND CORONARY ANGIOGRAPHY Left 12/20/2018   Procedure: LEFT HEART CATH AND CORONARY ANGIOGRAPHY;  Surgeon: Ammon Blunt, MD;  Location: [facility];  Service: Cardiovascular;  Laterality: Left;   LEFT HEART CATH AND CORONARY ANGIOGRAPHY N/A 01/12/2023   Procedure: LEFT HEART CATH AND CORONARY ANGIOGRAPHY;  Surgeon: Ammon Blunt, MD;  Location: [facility];  Service: Cardiovascular;  Laterality: N/A;   LOWER EXTREMITY  ANGIOGRAPHY Right 12/23/2022   Procedure: Lower Extremity Angiography;  Surgeon: Marea Selinda RAMAN, MD;  Location: [facility];  Service: Cardiovascular;  Laterality: Right;   RADIAL ARTERY HARVEST Left 12/27/2018   Procedure: ENDOSCOPIC RADIAL ARTERY HARVEST;  Surgeon: German Bartlett  Z, MD;  Location: [facility];  Service: Open Heart Surgery;  Laterality: Left;   REMOVAL OF A DIALYSIS CATHETER Left 03/20/2021   Procedure: REMOVAL OF A PD CATHETER;  Surgeon: Marea Selinda RAMAN, MD;  Location: [facility];  Service: Vascular;  Laterality: Left;   REVISION OF ARTERIOVENOUS GORETEX GRAFT Left 09/11/2021   Procedure: Excisionof infected AV graft;  Surgeon: Jama Cordella MATSU, MD;  Location: [facility];  Service: Vascular;  Laterality: Left;   SUBMUCOSAL INJECTION  01/09/2023   Procedure: SUBMUCOSAL INJECTION;  Surgeon: Toledo, Ladell POUR, MD;  Location: [facility] ENDOSCOPY;  Service: Gastroenterology;;   TEE WITHOUT CARDIOVERSION N/A 12/27/2018   Procedure: TRANSESOPHAGEAL ECHOCARDIOGRAM (TEE);  Surgeon: German Bartlett PEDLAR, MD;  Location: [facility] OR;  Service: Open Heart Surgery;  Laterality: N/A;   TUMOR REMOVAL  2019   Bladder    Family History  Problem Relation Age of Onset   Heart failure Mother     No Known Allergies     Latest Ref Rng & Units 01/13/2023    3:43 AM 01/12/2023    5:39 AM 01/11/2023    2:28 PM  CBC  WBC 4.0 - 10.5 K/uL 14.5  15.5  13.7   Hemoglobin 13.0 - 17.0 g/dL 8.3  9.1  9.6   Hematocrit 39.0 - 52.0 % 25.9  28.3  29.4   Platelets 150 - 400 K/uL 384  366  360       CMP     Component Value Date/Time   NA 136 01/13/2023 0343   NA 141 06/25/2022 1414   K 4.4 01/13/2023 0343   CL 100 01/13/2023 0343   CO2 21 (L) 01/13/2023 0343   GLUCOSE 138 (H) 01/13/2023 0343   BUN 78 (H) 01/13/2023 0343   BUN 39 (H) 06/25/2022 1414   CREATININE 7.03 (H) 01/13/2023 0343   CREATININE 4.62 (H) 01/10/2019 1351   CALCIUM  6.7 (L) 01/13/2023 0343   PROT 5.2 (L) 01/08/2023 1324   PROT 6.8  06/25/2022 1414   ALBUMIN  2.6 (L) 01/11/2023 1428   ALBUMIN  4.4 06/25/2022 1414   AST 28 01/08/2023 1324   ALT 17 01/08/2023 1324   ALKPHOS 45 01/08/2023 1324   BILITOT 0.2 01/08/2023 1324   BILITOT <0.2 06/25/2022 1414   EGFR 8 (L) 06/25/2022 1414   GFRNONAA 8 (L) 01/13/2023 0343     VAS US  ABI WITH/WO TBI Result Date: 02/01/2023  LOWER EXTREMITY DOPPLER STUDY Patient Name:  Chadd Tollison  Date of Exam:   01/27/2023 Medical Rec #: [id number]     Accession #:    [id number] Date of Birth: 10/03/1955    Patient Gender: M Patient Age:   25 years Exam Location:  [facility] Procedure:      VAS US  ABI WITH/WO TBI Referring Phys: Cordella Jama --------------------------------------------------------------------------------  Indications: Rest pain, and peripheral artery disease.  Vascular Interventions: 12/23/2022: Aortogram and Selective Right Lower                         Extremity Angiogram. PTA of the Right Posterior Tibial                         Artery and Tibioperoneal trunk with 2 mm diameter by 30                         cm length angioplasty balloon. PTA of the Right SFA and  Popliteal Artery with 5 mm diameter by 30 cm length                         angioplasty balloon. Stent placement to the Right SFA                         and Popliteal Artery with 6 mm diameter by 25 cm length                         and 6 mm diameter by 15 cm length Viabahn stents. Comparison Study: 12/15/2022 Performing Technologist: Leafy Gibes RVS  Examination Guidelines: A complete evaluation includes at minimum, Doppler waveform signals and systolic blood pressure reading at the level of bilateral brachial, anterior tibial, and posterior tibial arteries, when vessel segments are accessible. Bilateral testing is considered an integral part of a complete examination. Photoelectric Plethysmograph (PPG) waveforms and toe systolic pressure readings are included as required and additional  duplex testing as needed. Limited examinations for reoccurring indications may be performed as noted.  ABI Findings: +---------+------------------+-----+----------+--------+ Right    Rt Pressure (mmHg)IndexWaveform  Comment  +---------+------------------+-----+----------+--------+ Brachial 106                                       +---------+------------------+-----+----------+--------+ ATA      81                0.76 monophasic         +---------+------------------+-----+----------+--------+ PTA      73                0.68 monophasic         +---------+------------------+-----+----------+--------+ Great Toe63                0.59 Abnormal           +---------+------------------+-----+----------+--------+ +---------+------------------+-----+--------+-------+ Left     Lt Pressure (mmHg)IndexWaveformComment +---------+------------------+-----+--------+-------+ Brachial 107                                    +---------+------------------+-----+--------+-------+ ATA      105               0.98 biphasic        +---------+------------------+-----+--------+-------+ PTA      88                0.82 biphasic        +---------+------------------+-----+--------+-------+ Great Toe91                0.85 Normal          +---------+------------------+-----+--------+-------+ +-------+-----------+-----------+------------+------------+ ABI/TBIToday's ABIToday's TBIPrevious ABIPrevious TBI +-------+-----------+-----------+------------+------------+ Right  .76        .59        00          0            +-------+-----------+-----------+------------+------------+ Left   .98        .85        1.01        0            +-------+-----------+-----------+------------+------------+ Right TBIs and ABIs appear increased compared to prior study on 12/15/2022. Left ABIs appear essentially unchanged compared to prior study on 12/15/2022. Lt TBIs appear  to be increased  compared to prior study on 12/15/2022.  Summary: Right: Resting right ankle-brachial index indicates moderate right lower extremity arterial disease. The right toe-brachial index is abnormal. Left: Resting left ankle-brachial index is within normal range. The left toe-brachial index is normal. *See table(s) above for measurements and observations.  Electronically signed by Cordella Shawl MD on 02/01/2023 at 9:35:48 AM.    Final        Assessment & Plan:   1. 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  Given the severity of the patient's severe left lower extremity symptoms the patient should undergo angiography with the hope for intervention.  Risk and benefits were reviewed the patient.  Indications for the procedure were reviewed.  All questions were answered, the patient agrees to proceed with left lower extremity angiography and possible intervention.   The patient should continue walking and begin a more formal exercise program.  The patient should continue antiplatelet therapy and aggressive treatment of the lipid abnormalities  The patient will follow up with me after the angiogram.   2. Primary hypertension Continue antihypertensive medications as already ordered, these medications have been reviewed and there are no changes at this time.  3. Hyperlipidemia, unspecified hyperlipidemia type Continue statin as ordered and reviewed, no changes at this time   Current Outpatient Medications on File Prior to Visit  Medication Sig Dispense Refill   apixaban  (ELIQUIS ) 2.5 MG TABS tablet Take 1 tablet (2.5 mg total) by mouth 2 (two) times daily. 60 tablet 0   aspirin  EC 81 MG tablet Take 81 mg by mouth daily.     atorvastatin  (LIPITOR ) 20 MG tablet Take  1 tablet (20 mg total) by mouth daily. 30 tablet 11   isosorbide  dinitrate (ISORDIL ) 10 MG tablet Take 1 tablet (10 mg total) by mouth 3 (three) times daily. 90 tablet 0   sevelamer  carbonate (RENVELA ) 800 MG tablet Take 1,600 mg by mouth 3 (three) times daily.     traMADol  (ULTRAM ) 50 MG tablet Take 1 tablet (50 mg total) by mouth every 12 (twelve) hours as needed for severe pain (pain score 7-10). 36 tablet 0   pantoprazole  (PROTONIX ) 40 MG tablet Take 1 tablet (40 mg total) by mouth 2 (two) times daily before a meal. 60 tablet 0   No current facility-administered medications on file prior to visit.    There are no Patient Instructions on file for this visit. No follow-ups on file.   Kaymen Adrian E Izola Teague, NP

## 2023-03-04 ENCOUNTER — Telehealth (INDEPENDENT_AMBULATORY_CARE_PROVIDER_SITE_OTHER): Payer: Self-pay

## 2023-03-04 LAB — VAS US LOWER EXTREMITY ARTERIAL DUPLEX
Left ABI: 0.19
Right ABI: 0.77

## 2023-03-04 NOTE — Telephone Encounter (Signed)
 Spoke with the patient and he is scheduled for a left leg angio on 03/07/23 with a 10:30 am arrival time to the [facility]. Pre-procedure instructions were discussed and will be sent to Mychart.

## 2023-03-07 ENCOUNTER — Encounter
Admission: RE | Disposition: A | Discharge status: Home / Self Care | Payer: Self-pay | Source: Home / Self Care | Attending: Vascular Surgery

## 2023-03-07 ENCOUNTER — Other Ambulatory Visit: Payer: Self-pay

## 2023-03-07 ENCOUNTER — Ambulatory Visit
Admission: RE | Admit: 2023-03-07 | Discharge: 2023-03-07 | Disposition: A | Discharge status: Home / Self Care | Payer: Medicare Other | Attending: Vascular Surgery | Admitting: Vascular Surgery

## 2023-03-07 DIAGNOSIS — Z9889 Other specified postprocedural states: Secondary | ICD-10-CM

## 2023-03-07 DIAGNOSIS — N186 End stage renal disease: Secondary | ICD-10-CM | POA: Diagnosis not present

## 2023-03-07 DIAGNOSIS — I70212 Atherosclerosis of native arteries of extremities with intermittent claudication, left leg: Secondary | ICD-10-CM | POA: Insufficient documentation

## 2023-03-07 DIAGNOSIS — I132 Hypertensive heart and chronic kidney disease with heart failure and with stage 5 chronic kidney disease, or end stage renal disease: Secondary | ICD-10-CM | POA: Diagnosis not present

## 2023-03-07 DIAGNOSIS — I5022 Chronic systolic (congestive) heart failure: Secondary | ICD-10-CM | POA: Insufficient documentation

## 2023-03-07 DIAGNOSIS — E785 Hyperlipidemia, unspecified: Secondary | ICD-10-CM | POA: Diagnosis not present

## 2023-03-07 DIAGNOSIS — I743 Embolism and thrombosis of arteries of the lower extremities: Secondary | ICD-10-CM | POA: Diagnosis not present

## 2023-03-07 DIAGNOSIS — Z8249 Family history of ischemic heart disease and other diseases of the circulatory system: Secondary | ICD-10-CM | POA: Insufficient documentation

## 2023-03-07 DIAGNOSIS — Z87891 Personal history of nicotine dependence: Secondary | ICD-10-CM | POA: Diagnosis not present

## 2023-03-07 DIAGNOSIS — I771 Stricture of artery: Secondary | ICD-10-CM | POA: Diagnosis not present

## 2023-03-07 DIAGNOSIS — I70219 Atherosclerosis of native arteries of extremities with intermittent claudication, unspecified extremity: Secondary | ICD-10-CM

## 2023-03-07 HISTORY — PX: LOWER EXTREMITY ANGIOGRAPHY: CATH118251

## 2023-03-07 LAB — POTASSIUM: Potassium: 5.6 mmol/L — ABNORMAL HIGH (ref 3.5–5.1)

## 2023-03-07 LAB — POTASSIUM (ARMC VASCULAR LAB ONLY): Potassium (ARMC vascular lab): 6 mmol/L — ABNORMAL HIGH (ref 3.5–5.1)

## 2023-03-07 SURGERY — LOWER EXTREMITY ANGIOGRAPHY
Anesthesia: Moderate Sedation | Site: Leg Lower | Laterality: Left

## 2023-03-07 MED ORDER — SODIUM CHLORIDE 0.9% FLUSH: 3.0000 mL | Freq: Two times a day (BID) | INTRAVENOUS | Status: DC

## 2023-03-07 MED ORDER — SODIUM CHLORIDE 0.9 % IV SOLN: INTRAVENOUS | Status: DC

## 2023-03-07 MED ORDER — FENTANYL CITRATE (PF) 100 MCG/2ML IJ SOLN
INTRAMUSCULAR | Status: AC
  Filled 2023-03-07: qty 2

## 2023-03-07 MED ORDER — MIDAZOLAM HCL 2 MG/2ML IJ SOLN
INTRAMUSCULAR | Status: DC | PRN
  Administered 2023-03-07 (×2): .5 mg via INTRAVENOUS
  Administered 2023-03-07: 1 mg via INTRAVENOUS

## 2023-03-07 MED ORDER — CEFAZOLIN SODIUM-DEXTROSE 1-4 GM/50ML-% IV SOLN
INTRAVENOUS | Status: AC
  Filled 2023-03-07: qty 50

## 2023-03-07 MED ORDER — HEPARIN SODIUM (PORCINE) 1000 UNIT/ML IJ SOLN
INTRAMUSCULAR | Status: DC | PRN
  Administered 2023-03-07: 5000 [IU] via INTRAVENOUS

## 2023-03-07 MED ORDER — HEPARIN (PORCINE) IN NACL 1000-0.9 UT/500ML-% IV SOLN
INTRAVENOUS | Status: DC | PRN
  Administered 2023-03-07: 1000 mL

## 2023-03-07 MED ORDER — LIDOCAINE-EPINEPHRINE (PF) 1 %-1:200000 IJ SOLN
INTRAMUSCULAR | Status: DC | PRN
  Administered 2023-03-07: 10 mL

## 2023-03-07 MED ORDER — MIDAZOLAM HCL 2 MG/2ML IJ SOLN
INTRAMUSCULAR | Status: AC
  Filled 2023-03-07: qty 4

## 2023-03-07 MED ORDER — HYDROMORPHONE HCL 1 MG/ML IJ SOLN
INTRAMUSCULAR | Status: AC
  Filled 2023-03-07: qty 1

## 2023-03-07 MED ORDER — FAMOTIDINE 20 MG PO TABS: 40.0000 mg | ORAL_TABLET | Freq: Once | ORAL | Status: DC | PRN

## 2023-03-07 MED ORDER — LABETALOL HCL 5 MG/ML IV SOLN: 10.0000 mg | INTRAVENOUS | Status: DC | PRN

## 2023-03-07 MED ORDER — IODIXANOL 320 MG/ML IV SOLN
INTRAVENOUS | Status: DC | PRN
  Administered 2023-03-07: 55 mL

## 2023-03-07 MED ORDER — CEFAZOLIN SODIUM-DEXTROSE 1-4 GM/50ML-% IV SOLN
INTRAVENOUS | Status: DC | PRN
  Administered 2023-03-07: 1 g via INTRAVENOUS

## 2023-03-07 MED ORDER — ONDANSETRON HCL 4 MG/2ML IJ SOLN: 4.0000 mg | Freq: Four times a day (QID) | INTRAMUSCULAR | Status: DC | PRN

## 2023-03-07 MED ORDER — DIPHENHYDRAMINE HCL 50 MG/ML IJ SOLN: 50.0000 mg | Freq: Once | INTRAMUSCULAR | Status: DC | PRN

## 2023-03-07 MED ORDER — SODIUM CHLORIDE 0.9% FLUSH: 3.0000 mL | INTRAVENOUS | Status: DC | PRN

## 2023-03-07 MED ORDER — FENTANYL CITRATE (PF) 100 MCG/2ML IJ SOLN
INTRAMUSCULAR | Status: DC | PRN
  Administered 2023-03-07: 50 ug via INTRAVENOUS
  Administered 2023-03-07 (×2): 25 ug via INTRAVENOUS

## 2023-03-07 MED ORDER — METHYLPREDNISOLONE SODIUM SUCC 125 MG IJ SOLR: 125.0000 mg | Freq: Once | INTRAMUSCULAR | Status: DC | PRN

## 2023-03-07 MED ORDER — HYDRALAZINE HCL 20 MG/ML IJ SOLN: 5.0000 mg | INTRAMUSCULAR | Status: DC | PRN

## 2023-03-07 MED ORDER — SODIUM CHLORIDE 0.9 % IV SOLN: 250.0000 mL | INTRAVENOUS | Status: DC | PRN

## 2023-03-07 MED ORDER — CEFAZOLIN SODIUM-DEXTROSE 1-4 GM/50ML-% IV SOLN: 1.0000 g | INTRAVENOUS | Status: DC

## 2023-03-07 MED ORDER — ACETAMINOPHEN 325 MG PO TABS: 650.0000 mg | ORAL_TABLET | ORAL | Status: DC | PRN

## 2023-03-07 MED ORDER — HYDROMORPHONE HCL 1 MG/ML IJ SOLN
1.0000 mg | Freq: Once | INTRAMUSCULAR | Status: AC | PRN
  Administered 2023-03-07: 1 mg via INTRAVENOUS

## 2023-03-07 MED ORDER — HEPARIN SODIUM (PORCINE) 1000 UNIT/ML IJ SOLN
INTRAMUSCULAR | Status: AC
Start: 2023-03-07 — End: ?
  Filled 2023-03-07: qty 10

## 2023-03-07 MED ORDER — MIDAZOLAM HCL 2 MG/ML PO SYRP: 8.0000 mg | ORAL_SOLUTION | Freq: Once | ORAL | Status: DC | PRN

## 2023-03-07 SURGICAL SUPPLY — 24 items
BALLN LUTONIX 018 5X300X130 (BALLOONS) ×1 IMPLANT
BALLN LUTONIX DCB 4X100X130 (BALLOONS) ×1 IMPLANT
BALLN ULTRVRSE 2.5X300X150 (BALLOONS) ×1 IMPLANT
BALLOON LUTONIX 018 5X300X130 (BALLOONS) IMPLANT
BALLOON LUTONIX DCB 4X100X130 (BALLOONS) IMPLANT
BALLOON ULTRVRSE 2.5X300X150 (BALLOONS) IMPLANT
CATH ANGIO 5F PIGTAIL 65CM (CATHETERS) IMPLANT
CATH ROTAREX 135 6FR (CATHETERS) IMPLANT
CATH SEEKER .035X135CM (CATHETERS) IMPLANT
CATH TEMPO 5F RIM 65CM (CATHETERS) IMPLANT
CATH VERT 5X100 (CATHETERS) IMPLANT
COVER PROBE ULTRASOUND 5X96 (MISCELLANEOUS) IMPLANT
DEVICE PRESTO INFLATION (MISCELLANEOUS) IMPLANT
DEVICE STARCLOSE SE CLOSURE (Vascular Products) IMPLANT
GLIDEWIRE ADV .035X260CM (WIRE) IMPLANT
GUIDEWIRE PFTE-COATED .018X300 (WIRE) IMPLANT
PACK ANGIOGRAPHY (CUSTOM PROCEDURE TRAY) ×1 IMPLANT
SHEATH ANL2 6FRX45 HC (SHEATH) IMPLANT
SHEATH BRITE TIP 5FRX11 (SHEATH) IMPLANT
STENT VIABAHN 6X250X120 (Permanent Stent) IMPLANT
SYR MEDRAD MARK 7 150ML (SYRINGE) IMPLANT
TUBING CONTRAST HIGH PRESS 72 (TUBING) IMPLANT
WIRE G V18X300CM (WIRE) IMPLANT
WIRE GUIDERIGHT .035X150 (WIRE) IMPLANT

## 2023-03-07 NOTE — Progress Notes (Signed)
 Potassium 6, order placed for a redraw, Provider notified.

## 2023-03-07 NOTE — Op Note (Signed)
 [facility]  Percutaneous Study/Intervention Procedural Note   Date of Surgery: 03/07/2023  Surgeon(s):Seretha Estabrooks    Assistants:none  Pre-operative Diagnosis: PAD with claudication left lower extremity  Post-operative diagnosis:  Same  Procedure(s) Performed:             1.  Ultrasound guidance for vascular access right femoral artery             2.  Catheter placement into left common femoral artery from right femoral approach             3.  Aortogram and selective left lower extremity angiogram             4.  Percutaneous transluminal angioplasty of left posterior tibial artery and tibioperoneal trunk with 2.5 mm diameter angioplasty balloon             5.  Mechanical thrombectomy of the left SFA and popliteal arteries with the Rota Rex device  6.  Percutaneous transluminal angioplasty of left popliteal artery and tibioperoneal trunk with 4 mm diameter Lutonix drug-coated angioplasty balloon  7.  Viabahn stent placement of left SFA and most proximal popliteal artery with 6 mm diameter by 25 cm length Viabahn stent             8.  StarClose closure device right femoral artery  EBL: 50 cc  Contrast: 55 cc  Fluoro Time: 9.4 minutes  Moderate Conscious Sedation Time: approximately 56 minutes using 2 mg of Versed  and 100 mcg of Fentanyl               Indications:  Patient is a 68 y.o.male with disabling claudication symptoms of the left lower extremity.  He is status post revascularization for limb salvage on the right lower extremity few months ago and has multiple medical issues and atherosclerotic risk factors including end-stage renal disease. The patient has noninvasive study showing occlusion/thrombosis of the left SFA and popliteal arteries with significant reduction in the ABIs and digital pressures on the left leg. Risks and benefits are discussed and informed consent is obtained.   Procedure:  The patient was identified and appropriate procedural time  out was performed.  The patient was then placed supine on the table and prepped and draped in the usual sterile fashion. Moderate conscious sedation was administered during a face to face encounter with the patient throughout the procedure with my supervision of the RN administering medicines and monitoring the patient's vital signs, pulse oximetry, telemetry and mental status throughout from the start of the procedure until the patient was taken to the recovery room. Ultrasound was used to evaluate the right common femoral artery.  It was patent .  A digital ultrasound image was acquired.  A Seldinger needle was used to access the right common femoral artery under direct ultrasound guidance and a permanent image was performed.  A 0.035 J wire was advanced without resistance and a 5Fr sheath was placed.  Pigtail catheter was placed into the aorta and an AP aortogram was performed. This demonstrated minimal flow in the renal arteries and normal aorta and iliac segments without significant stenosis although they were markedly tortuous. I then crossed the aortic bifurcation and advanced to the left femoral head. Selective left lower extremity angiogram was then performed. This demonstrated some disease in the distal common femoral artery spilling into the origin of the profunda femoris artery with at least moderate stenosis and about an 80% stenosis with the origin of the left SFA.  The SFA was then continuous for about 8 to 10 cm before it somewhat abruptly occluded with thrombosis of severe chronic disease.  There was severe calcification and atherosclerosis even after reconstitution of the above-knee popliteal artery with a very high-grade stenosis in the mid and distal popliteal artery of greater than 90%.  The posterior tibial artery was really the only runoff distally but it was also heavily diseased with greater than 80% stenosis at the origin and nearly occlusive stenosis in the midsegment as well as 70 to 80%  stenosis in the tibioperoneal trunk. It was felt that it was in the patient's best interest to proceed with intervention after these images to avoid a second procedure and a larger amount of contrast and fluoroscopy based off of the findings from the initial angiogram. The patient was systemically heparinized and a 6 French Ansell sheath was then placed over the [employer] wire. I then used a Kumpe catheter and the advantage wire to easily navigate through the occluded SFA and popliteal arteries and then exchanged for a 0.018 advantage wire to cross the disease in the tibioperoneal trunk and posterior tibial artery.  I then exchanged for a V18 wire which was parked in the foot.  I then began by performing mechanical thrombectomy for the thrombosed/occluded left SFA and popliteal arteries.  3 passes were made with the Rota Rex device to debulk the thrombus in the left SFA and popliteal arteries.  Following this, there was marked improvement with minimal residual thrombus but multiple areas of residual high-grade stenosis were present throughout.  I then proceeded with angioplasty of the posterior tibial artery and tibioperoneal trunk first using a 2.5 mm diameter by 30 cm length angioplasty balloon inflated to 12 atm from the distal posterior tibial artery up to the tibioperoneal trunk.  The tibioperoneal trunk had significant residual disease and a 4 mm diameter by 10 cm length Lutonix drug-coated angioplasty balloon was inflated to 10 atm for 1 minute.  I elected to go ahead and stent the left SFA and most proximal popliteal artery with a 6 mm diameter by 25 cm length Viabahn stent.  This was postdilated with a 5 mm diameter by 30 cm length Lutonix drug-coated balloon that was also taken up to the origin of the SFA to treat the origin disease.  Completion imaging showed about a 30 to 40% residual stenosis at the origin that was markedly improved.  There is no significant residual stenosis throughout the stent  in the left SFA and was proximal popliteal artery.  There was less than 30% residual stenosis in the tibioperoneal trunk, distal popliteal artery, and posterior tibial artery after angioplasty.  I elected to terminate the procedure. The sheath was removed and StarClose closure device was deployed in the right femoral artery with excellent hemostatic result. The patient was taken to the recovery room in stable condition having tolerated the procedure well.  Findings:               Aortogram:  This demonstrated minimal flow in the renal arteries and normal aorta and iliac segments without significant stenosis although they were markedly tortuous.             Left Lower Extremity:  This demonstrated some disease in the distal common femoral artery spilling into the origin of the profunda femoris artery with at least moderate stenosis and about an 80% stenosis with the origin of the left SFA.  The SFA was then continuous for about 8 to  10 cm before it somewhat abruptly occluded with thrombosis of severe chronic disease.  There was severe calcification and atherosclerosis even after reconstitution of the above-knee popliteal artery with a very high-grade stenosis in the mid and distal popliteal artery of greater than 90%.  The posterior tibial artery was really the only runoff distally but it was also heavily diseased with greater than 80% stenosis at the origin and nearly occlusive stenosis in the midsegment as well as 70 to 80% stenosis in the tibioperoneal trunk.   Disposition: Patient was taken to the recovery room in stable condition having tolerated the procedure well.  Complications: None  Selinda Gu 03/07/2023 2:01 PM   This note was created with Dragon Medical transcription system. Any errors in dictation are purely unintentional.

## 2023-03-07 NOTE — Interval H&P Note (Signed)
 History and Physical Interval Note:  03/07/2023 10:50 AM  Anthony Hess  has presented today for surgery, with the diagnosis of LLE Angio   ASO w claudication.  The various methods of treatment have been discussed with the patient and family. After consideration of risks, benefits and other options for treatment, the patient has consented to  Procedure(s): Lower Extremity Angiography (Left) as a surgical intervention.  The patient's history has been reviewed, patient examined, no change in status, stable for surgery.  I have reviewed the patient's chart and labs.  Questions were answered to the patient's satisfaction.     [name]

## 2023-03-08 ENCOUNTER — Telehealth (INDEPENDENT_AMBULATORY_CARE_PROVIDER_SITE_OTHER): Payer: Self-pay

## 2023-03-08 ENCOUNTER — Other Ambulatory Visit (INDEPENDENT_AMBULATORY_CARE_PROVIDER_SITE_OTHER): Payer: Self-pay | Admitting: Nurse Practitioner

## 2023-03-08 ENCOUNTER — Encounter: Payer: Self-pay | Admitting: Vascular Surgery

## 2023-03-08 MED ORDER — HYDROCODONE-ACETAMINOPHEN 5-325 MG PO TABS: 1.0000 | ORAL_TABLET | Freq: Four times a day (QID) | ORAL | 0 refills | Status: DC | PRN

## 2023-03-08 NOTE — Telephone Encounter (Signed)
 We typically don't send home pain medication routinely after this procedure, but I have sent a one time rx for hydrocodone

## 2023-03-08 NOTE — Telephone Encounter (Signed)
 Patient called stating he is in pain and wasn't prescribed any meds at discharge.   Please advise

## 2023-03-08 NOTE — Telephone Encounter (Signed)
 Message given

## 2023-03-15 ENCOUNTER — Telehealth (INDEPENDENT_AMBULATORY_CARE_PROVIDER_SITE_OTHER): Payer: Self-pay

## 2023-03-15 NOTE — Telephone Encounter (Signed)
Patient left a message stating that he is still having leg pain. I spoke with patient spouse and informed that she will have him return a call to the office to give further information

## 2023-03-15 NOTE — Telephone Encounter (Signed)
Patient spouse return called and informed that her husband is having constant dull pain with the left leg. Patient spouse stated that the pain has got better since the angio but he would like to know why he is still experiencing in pain after the procedure. Please Advise

## 2023-03-15 NOTE — Telephone Encounter (Signed)
There can be soreness of stretching of the artery so this pain is common and can sometimes take a week or two to resolve basedon the patients pain tolerance

## 2023-03-16 NOTE — Telephone Encounter (Signed)
Patient was notified with medical recommendations and verbalized understanding

## 2023-03-19 ENCOUNTER — Other Ambulatory Visit
Admission: RE | Admit: 2023-03-19 | Discharge: 2023-03-19 | Disposition: A | Discharge status: Home / Self Care | Payer: Medicare Other | Source: Ambulatory Visit | Attending: Nephrology | Admitting: Nephrology

## 2023-03-19 DIAGNOSIS — I70219 Atherosclerosis of native arteries of extremities with intermittent claudication, unspecified extremity: Secondary | ICD-10-CM | POA: Insufficient documentation

## 2023-03-19 LAB — POTASSIUM: Potassium: 5.1 mmol/L (ref 3.5–5.1)

## 2023-04-06 ENCOUNTER — Other Ambulatory Visit (INDEPENDENT_AMBULATORY_CARE_PROVIDER_SITE_OTHER): Payer: Self-pay | Admitting: Vascular Surgery

## 2023-04-06 DIAGNOSIS — Z9889 Other specified postprocedural states: Secondary | ICD-10-CM

## 2023-04-08 ENCOUNTER — Encounter (INDEPENDENT_AMBULATORY_CARE_PROVIDER_SITE_OTHER): Payer: Medicare Other

## 2023-04-08 ENCOUNTER — Ambulatory Visit (INDEPENDENT_AMBULATORY_CARE_PROVIDER_SITE_OTHER): Payer: Medicaid Other | Admitting: Nurse Practitioner

## 2023-04-20 ENCOUNTER — Telehealth (INDEPENDENT_AMBULATORY_CARE_PROVIDER_SITE_OTHER): Payer: Self-pay

## 2023-04-20 NOTE — Telephone Encounter (Signed)
 A fax was received from [facility] to schedule the patient for a permcath exchange  with Dr. Wyn Quaker on 04/25/23 with a 11:00 am arrival time to the [facility]. Pre-procedure instructions will be faxed back to [facility] per request.

## 2023-04-22 ENCOUNTER — Other Ambulatory Visit: Payer: Self-pay

## 2023-04-22 ENCOUNTER — Telehealth (INDEPENDENT_AMBULATORY_CARE_PROVIDER_SITE_OTHER): Payer: Self-pay | Admitting: Vascular Surgery

## 2023-04-22 ENCOUNTER — Ambulatory Visit
Admission: EM | Admit: 2023-04-22 | Discharge: 2023-04-22 | Disposition: A | Discharge status: Home / Self Care | Payer: Medicare Other

## 2023-04-22 ENCOUNTER — Encounter: Payer: Self-pay | Admitting: Emergency Medicine

## 2023-04-22 DIAGNOSIS — I70201 Unspecified atherosclerosis of native arteries of extremities, right leg: Secondary | ICD-10-CM

## 2023-04-22 NOTE — ED Provider Notes (Signed)
 Patient presents for evaluation of right foot pain beginning at the midfoot extending into the lower extremity present for 3 days.  Has been constant, exacerbated when walking and bearing weight.  Has not attempted treatment.  History of atherosclerosis with stent placement 4-5 months ago.  On exam right foot and the right lower extremity is ashen in color, cool to touch and tender to palpation, +1 dorsalis pedis pulse present, checked by nurse and this provider , difficulty finding pedal pulse, Doppler not available, no abnormality to the left leg, patient has been sent to the nearest emergency department, to escort self, for evaluation of his circulation   Valinda Hoar, NP 04/22/23 [phone]

## 2023-04-22 NOTE — ED Notes (Signed)
 Patient is being discharged from the Urgent Care and sent to the Emergency Department via private vehicle . Per Hansel Starling, APP, patient is in need of higher level of care due to difficult to find pulse in leg with stent within past 12 months with color change and pain. Patient is aware and verbalizes understanding of plan of care.  Vitals:   04/22/23 0926  BP: 111/83  Pulse: 63  Resp: 18  Temp: 97.8 F (36.6 C)  SpO2: 90%

## 2023-04-22 NOTE — ED Triage Notes (Signed)
 Patient presents to [facility] for evaluation fo 3 daysof right foot pain, no known injury, but has had stents put in recently.  Top of foot hurts the worse.  Worse pain with ambulation

## 2023-04-22 NOTE — Telephone Encounter (Signed)
 Patient drop something on the right foot 2 days ago since then he is having pain at level 8 out of 10. Also he is not able to bare weight on the right foot.

## 2023-04-22 NOTE — Telephone Encounter (Signed)
 Per pt called and stated he need to be seen soon due to new symptoms. Pt stated he went to urgent care today and and now is wanting to be seen as soon as possible. His next appt with [place] is 04/25/23 this is our next available appt.Transferred to the line advised pt is symptoms worsen go to ER

## 2023-04-22 NOTE — Telephone Encounter (Signed)
 This is likely traumatic since he dropped something on it.  We don't deal with traumatic injuries, so he should see his PCP or ED for xrays and evaluation

## 2023-04-22 NOTE — Discharge Instructions (Signed)
 Please go to the nearest emergency department for a thorough evaluation of your right leg circulation, I have concern for a possible blockage   In comparison to your left leg it is darkened, cool to touch, you are experiencing pain and it took longer to find your pulse by myself and the nurse

## 2023-04-22 NOTE — Telephone Encounter (Signed)
 Patient notified with medical recommendations and verbalized understanding

## 2023-04-23 ENCOUNTER — Inpatient Hospital Stay
Admission: EM | Admit: 2023-04-23 | Discharge: 2023-05-24 | DRG: 270 | Disposition: E | Source: Ambulatory Visit | Attending: Student in an Organized Health Care Education/Training Program | Admitting: Student in an Organized Health Care Education/Training Program

## 2023-04-23 ENCOUNTER — Encounter: Payer: Self-pay | Admitting: Emergency Medicine

## 2023-04-23 ENCOUNTER — Other Ambulatory Visit: Payer: Self-pay

## 2023-04-23 DIAGNOSIS — Z720 Tobacco use: Secondary | ICD-10-CM | POA: Diagnosis not present

## 2023-04-23 DIAGNOSIS — J449 Chronic obstructive pulmonary disease, unspecified: Secondary | ICD-10-CM | POA: Diagnosis present

## 2023-04-23 DIAGNOSIS — N186 End stage renal disease: Secondary | ICD-10-CM | POA: Diagnosis not present

## 2023-04-23 DIAGNOSIS — I2510 Atherosclerotic heart disease of native coronary artery without angina pectoris: Secondary | ICD-10-CM | POA: Diagnosis present

## 2023-04-23 DIAGNOSIS — Z8711 Personal history of peptic ulcer disease: Secondary | ICD-10-CM

## 2023-04-23 DIAGNOSIS — I4891 Unspecified atrial fibrillation: Secondary | ICD-10-CM

## 2023-04-23 DIAGNOSIS — I998 Other disorder of circulatory system: Principal | ICD-10-CM | POA: Diagnosis present

## 2023-04-23 DIAGNOSIS — N2581 Secondary hyperparathyroidism of renal origin: Secondary | ICD-10-CM | POA: Diagnosis present

## 2023-04-23 DIAGNOSIS — E875 Hyperkalemia: Secondary | ICD-10-CM | POA: Diagnosis not present

## 2023-04-23 DIAGNOSIS — F17200 Nicotine dependence, unspecified, uncomplicated: Secondary | ICD-10-CM | POA: Diagnosis present

## 2023-04-23 DIAGNOSIS — Z91199 Patient's noncompliance with other medical treatment and regimen due to unspecified reason: Secondary | ICD-10-CM

## 2023-04-23 DIAGNOSIS — I5042 Chronic combined systolic (congestive) and diastolic (congestive) heart failure: Secondary | ICD-10-CM | POA: Diagnosis present

## 2023-04-23 DIAGNOSIS — F17210 Nicotine dependence, cigarettes, uncomplicated: Secondary | ICD-10-CM | POA: Diagnosis present

## 2023-04-23 DIAGNOSIS — I5030 Unspecified diastolic (congestive) heart failure: Secondary | ICD-10-CM | POA: Diagnosis present

## 2023-04-23 DIAGNOSIS — Z7901 Long term (current) use of anticoagulants: Secondary | ICD-10-CM

## 2023-04-23 DIAGNOSIS — I4901 Ventricular fibrillation: Secondary | ICD-10-CM | POA: Diagnosis not present

## 2023-04-23 DIAGNOSIS — I420 Dilated cardiomyopathy: Secondary | ICD-10-CM | POA: Diagnosis present

## 2023-04-23 DIAGNOSIS — Z951 Presence of aortocoronary bypass graft: Secondary | ICD-10-CM

## 2023-04-23 DIAGNOSIS — T82868A Thrombosis of vascular prosthetic devices, implants and grafts, initial encounter: Principal | ICD-10-CM | POA: Diagnosis present

## 2023-04-23 DIAGNOSIS — J9690 Respiratory failure, unspecified, unspecified whether with hypoxia or hypercapnia: Secondary | ICD-10-CM | POA: Diagnosis not present

## 2023-04-23 DIAGNOSIS — I462 Cardiac arrest due to underlying cardiac condition: Secondary | ICD-10-CM | POA: Diagnosis not present

## 2023-04-23 DIAGNOSIS — I70223 Atherosclerosis of native arteries of extremities with rest pain, bilateral legs: Secondary | ICD-10-CM | POA: Diagnosis present

## 2023-04-23 DIAGNOSIS — I959 Hypotension, unspecified: Secondary | ICD-10-CM | POA: Diagnosis not present

## 2023-04-23 DIAGNOSIS — I70221 Atherosclerosis of native arteries of extremities with rest pain, right leg: Secondary | ICD-10-CM

## 2023-04-23 DIAGNOSIS — E782 Mixed hyperlipidemia: Secondary | ICD-10-CM | POA: Diagnosis present

## 2023-04-23 DIAGNOSIS — Z992 Dependence on renal dialysis: Secondary | ICD-10-CM

## 2023-04-23 DIAGNOSIS — Z6821 Body mass index (BMI) 21.0-21.9, adult: Secondary | ICD-10-CM

## 2023-04-23 DIAGNOSIS — Z8551 Personal history of malignant neoplasm of bladder: Secondary | ICD-10-CM

## 2023-04-23 DIAGNOSIS — Z8616 Personal history of COVID-19: Secondary | ICD-10-CM

## 2023-04-23 DIAGNOSIS — I132 Hypertensive heart and chronic kidney disease with heart failure and with stage 5 chronic kidney disease, or end stage renal disease: Secondary | ICD-10-CM | POA: Diagnosis present

## 2023-04-23 DIAGNOSIS — Y712 Prosthetic and other implants, materials and accessory cardiovascular devices associated with adverse incidents: Secondary | ICD-10-CM | POA: Diagnosis not present

## 2023-04-23 DIAGNOSIS — T8241XA Breakdown (mechanical) of vascular dialysis catheter, initial encounter: Secondary | ICD-10-CM | POA: Diagnosis present

## 2023-04-23 DIAGNOSIS — I213 ST elevation (STEMI) myocardial infarction of unspecified site: Secondary | ICD-10-CM | POA: Diagnosis not present

## 2023-04-23 DIAGNOSIS — K219 Gastro-esophageal reflux disease without esophagitis: Secondary | ICD-10-CM | POA: Diagnosis present

## 2023-04-23 DIAGNOSIS — Z7982 Long term (current) use of aspirin: Secondary | ICD-10-CM

## 2023-04-23 DIAGNOSIS — I482 Chronic atrial fibrillation, unspecified: Secondary | ICD-10-CM | POA: Diagnosis present

## 2023-04-23 DIAGNOSIS — I10 Essential (primary) hypertension: Secondary | ICD-10-CM | POA: Diagnosis present

## 2023-04-23 DIAGNOSIS — R636 Underweight: Secondary | ICD-10-CM | POA: Diagnosis present

## 2023-04-23 DIAGNOSIS — E872 Acidosis, unspecified: Secondary | ICD-10-CM | POA: Diagnosis not present

## 2023-04-23 DIAGNOSIS — Z8249 Family history of ischemic heart disease and other diseases of the circulatory system: Secondary | ICD-10-CM

## 2023-04-23 DIAGNOSIS — Z87442 Personal history of urinary calculi: Secondary | ICD-10-CM

## 2023-04-23 DIAGNOSIS — D631 Anemia in chronic kidney disease: Secondary | ICD-10-CM | POA: Diagnosis present

## 2023-04-23 DIAGNOSIS — Z79899 Other long term (current) drug therapy: Secondary | ICD-10-CM

## 2023-04-23 DIAGNOSIS — Z8719 Personal history of other diseases of the digestive system: Secondary | ICD-10-CM

## 2023-04-23 DIAGNOSIS — I252 Old myocardial infarction: Secondary | ICD-10-CM

## 2023-04-23 DIAGNOSIS — R64 Cachexia: Secondary | ICD-10-CM | POA: Diagnosis present

## 2023-04-23 LAB — CBC WITH DIFFERENTIAL/PLATELET
Abs Immature Granulocytes: 0.06 10*3/uL (ref 0.00–0.07)
Abs Immature Granulocytes: 0.06 10*3/uL (ref 0.00–0.07)
Basophils Absolute: 0 10*3/uL (ref 0.0–0.1)
Basophils Absolute: 0.1 10*3/uL (ref 0.0–0.1)
Basophils Relative: 0 %
Basophils Relative: 0 %
Eosinophils Absolute: 0.2 10*3/uL (ref 0.0–0.5)
Eosinophils Absolute: 0.1 10*3/uL (ref 0.0–0.5)
Eosinophils Relative: 1 %
Eosinophils Relative: 1 %
HCT: 46.8 % (ref 39.0–52.0)
HCT: 42.6 % (ref 39.0–52.0)
Hemoglobin: 13.6 g/dL (ref 13.0–17.0)
Hemoglobin: 12.9 g/dL — ABNORMAL LOW (ref 13.0–17.0)
Immature Granulocytes: 0 %
Immature Granulocytes: 0 %
Lymphocytes Relative: 10 %
Lymphocytes Relative: 9 %
Lymphs Abs: 1.4 10*3/uL (ref 0.7–4.0)
Lymphs Abs: 1.2 10*3/uL (ref 0.7–4.0)
MCH: 26.5 pg (ref 26.0–34.0)
MCH: 26.4 pg (ref 26.0–34.0)
MCHC: 29.1 g/dL — ABNORMAL LOW (ref 30.0–36.0)
MCHC: 30.3 g/dL (ref 30.0–36.0)
MCV: 91.2 fL (ref 80.0–100.0)
MCV: 87.3 fL (ref 80.0–100.0)
Monocytes Absolute: 1.6 10*3/uL — ABNORMAL HIGH (ref 0.1–1.0)
Monocytes Absolute: 1.8 10*3/uL — ABNORMAL HIGH (ref 0.1–1.0)
Monocytes Relative: 11 %
Monocytes Relative: 14 %
Neutro Abs: 11.1 10*3/uL — ABNORMAL HIGH (ref 1.7–7.7)
Neutro Abs: 10.1 10*3/uL — ABNORMAL HIGH (ref 1.7–7.7)
Neutrophils Relative %: 78 %
Neutrophils Relative %: 76 %
Platelets: 272 10*3/uL (ref 150–400)
Platelets: 281 10*3/uL (ref 150–400)
RBC: 5.13 MIL/uL (ref 4.22–5.81)
RBC: 4.88 MIL/uL (ref 4.22–5.81)
RDW: 21.2 % — ABNORMAL HIGH (ref 11.5–15.5)
RDW: 20.8 % — ABNORMAL HIGH (ref 11.5–15.5)
Smear Review: NORMAL
WBC: 14.3 10*3/uL — ABNORMAL HIGH (ref 4.0–10.5)
WBC: 13.3 10*3/uL — ABNORMAL HIGH (ref 4.0–10.5)
nRBC: 0 % (ref 0.0–0.2)
nRBC: 0 % (ref 0.0–0.2)

## 2023-04-23 LAB — COMPREHENSIVE METABOLIC PANEL
ALT: 22 U/L (ref 0–44)
AST: 38 U/L (ref 15–41)
Albumin: 3.9 g/dL (ref 3.5–5.0)
Alkaline Phosphatase: 76 U/L (ref 38–126)
Anion gap: 19 — ABNORMAL HIGH (ref 5–15)
BUN: 29 mg/dL — ABNORMAL HIGH (ref 8–23)
CO2: 21 mmol/L — ABNORMAL LOW (ref 22–32)
Calcium: 8.4 mg/dL — ABNORMAL LOW (ref 8.9–10.3)
Chloride: 95 mmol/L — ABNORMAL LOW (ref 98–111)
Creatinine, Ser: 4.65 mg/dL — ABNORMAL HIGH (ref 0.61–1.24)
GFR, Estimated: 13 mL/min — ABNORMAL LOW (ref >60)
Glucose, Bld: 95 mg/dL (ref 70–99)
Potassium: 3.8 mmol/L (ref 3.5–5.1)
Sodium: 135 mmol/L (ref 135–145)
Total Bilirubin: 0.7 mg/dL (ref 0.0–1.2)
Total Protein: 8.1 g/dL (ref 6.5–8.1)

## 2023-04-23 LAB — LACTIC ACID, PLASMA
Lactic Acid, Venous: 1.8 mmol/L (ref 0.5–1.9)
Lactic Acid, Venous: 0.9 mmol/L (ref 0.5–1.9)
Lactic Acid, Venous: 1.6 mmol/L (ref 0.5–1.9)

## 2023-04-23 LAB — PROTIME-INR
INR: 1.1 (ref 0.8–1.2)
Prothrombin Time: 14.4 s (ref 11.4–15.2)

## 2023-04-23 LAB — BASIC METABOLIC PANEL
Anion gap: 27 — ABNORMAL HIGH (ref 5–15)
BUN: 49 mg/dL — ABNORMAL HIGH (ref 8–23)
CO2: 15 mmol/L — ABNORMAL LOW (ref 22–32)
Calcium: 7.9 mg/dL — ABNORMAL LOW (ref 8.9–10.3)
Chloride: 97 mmol/L — ABNORMAL LOW (ref 98–111)
Creatinine, Ser: 7.28 mg/dL — ABNORMAL HIGH (ref 0.61–1.24)
GFR, Estimated: 8 mL/min — ABNORMAL LOW (ref >60)
Glucose, Bld: 53 mg/dL — ABNORMAL LOW (ref 70–99)
Potassium: 5.1 mmol/L (ref 3.5–5.1)
Sodium: 139 mmol/L (ref 135–145)

## 2023-04-23 LAB — TYPE AND SCREEN
ABO/RH(D): A POS
Antibody Screen: NEGATIVE

## 2023-04-23 LAB — HEPATITIS B SURFACE ANTIGEN: Hepatitis B Surface Ag: NONREACTIVE

## 2023-04-23 LAB — PHOSPHORUS: Phosphorus: 7.3 mg/dL — ABNORMAL HIGH (ref 2.5–4.6)

## 2023-04-23 LAB — CBG MONITORING, ED: Glucose-Capillary: 95 mg/dL (ref 70–99)

## 2023-04-23 LAB — GLUCOSE, CAPILLARY
Glucose-Capillary: 107 mg/dL — ABNORMAL HIGH (ref 70–99)
Glucose-Capillary: 112 mg/dL — ABNORMAL HIGH (ref 70–99)

## 2023-04-23 LAB — APTT
aPTT: 24 s (ref 24–36)
aPTT: 44 s — ABNORMAL HIGH (ref 24–36)

## 2023-04-23 LAB — HEPARIN LEVEL (UNFRACTIONATED): Heparin Unfractionated: <0.1 [IU]/mL — ABNORMAL LOW (ref 0.30–0.70)

## 2023-04-23 MED ORDER — FENTANYL CITRATE PF 50 MCG/ML IJ SOSY: 50.0000 ug | PREFILLED_SYRINGE | Freq: Once | INTRAMUSCULAR | Status: DC

## 2023-04-23 MED ORDER — HEPARIN BOLUS VIA INFUSION
3100.0000 [IU] | Freq: Once | INTRAVENOUS | Status: AC
  Administered 2023-04-23: 3100 [IU] via INTRAVENOUS
  Filled 2023-04-23: qty 3100

## 2023-04-23 MED ORDER — HEPARIN SODIUM (PORCINE) 1000 UNIT/ML IJ SOLN
INTRAMUSCULAR | Status: AC
  Filled 2023-04-23: qty 10

## 2023-04-23 MED ORDER — FENTANYL CITRATE PF 50 MCG/ML IJ SOSY
50.0000 ug | PREFILLED_SYRINGE | Freq: Once | INTRAMUSCULAR | Status: AC
  Administered 2023-04-23: 50 ug via INTRAVENOUS
  Filled 2023-04-23: qty 1

## 2023-04-23 MED ORDER — CALCIUM ACETATE (PHOS BINDER) 667 MG PO CAPS
2001.0000 mg | ORAL_CAPSULE | Freq: Three times a day (TID) | ORAL | Status: DC
  Administered 2023-04-23 – 2023-04-24 (×3): 2001 mg via ORAL
  Filled 2023-04-23 (×7): qty 3

## 2023-04-23 MED ORDER — HEPARIN BOLUS VIA INFUSION
1600.0000 [IU] | Freq: Once | INTRAVENOUS | Status: AC
  Administered 2023-04-23: 1600 [IU] via INTRAVENOUS
  Filled 2023-04-23: qty 1600

## 2023-04-23 MED ORDER — HEPARIN (PORCINE) 25000 UT/250ML-% IV SOLN
1250.0000 [IU]/h | INTRAVENOUS | Status: DC
Start: 2023-04-23 — End: 2023-04-24
  Administered 2023-04-23: 850 [IU]/h via INTRAVENOUS
  Administered 2023-04-24: 1250 [IU]/h via INTRAVENOUS
  Filled 2023-04-23 (×2): qty 250

## 2023-04-23 MED ORDER — DEXTROSE 50 % IV SOLN
12.5000 g | INTRAVENOUS | Status: AC
  Filled 2023-04-23: qty 50

## 2023-04-23 MED ORDER — HYDROMORPHONE HCL 1 MG/ML IJ SOLN
1.0000 mg | INTRAMUSCULAR | Status: DC | PRN
  Administered 2023-04-23 – 2023-04-24 (×3): 1 mg via INTRAVENOUS
  Filled 2023-04-23 (×4): qty 1

## 2023-04-23 MED ORDER — MORPHINE SULFATE (PF) 2 MG/ML IV SOLN
2.0000 mg | INTRAVENOUS | Status: DC | PRN
  Administered 2023-04-23 (×2): 2 mg via INTRAVENOUS
  Filled 2023-04-23 (×2): qty 1

## 2023-04-23 MED ORDER — CHLORHEXIDINE GLUCONATE CLOTH 2 % EX PADS
6.0000 | MEDICATED_PAD | Freq: Every day | CUTANEOUS | Status: DC
Start: 2023-04-23 — End: 2023-04-26
  Administered 2023-04-24 – 2023-04-25 (×2): 6 via TOPICAL
  Filled 2023-04-23: qty 6

## 2023-04-23 MED ORDER — NICOTINE 14 MG/24HR TD PT24
14.0000 mg | MEDICATED_PATCH | Freq: Every day | TRANSDERMAL | Status: DC
  Filled 2023-04-23: qty 1

## 2023-04-23 MED ORDER — ONDANSETRON HCL 4 MG PO TABS: 4.0000 mg | ORAL_TABLET | Freq: Four times a day (QID) | ORAL | Status: DC | PRN

## 2023-04-23 MED ORDER — ONDANSETRON HCL 4 MG/2ML IJ SOLN: 4.0000 mg | Freq: Four times a day (QID) | INTRAMUSCULAR | Status: DC | PRN

## 2023-04-23 NOTE — ED Triage Notes (Signed)
 Patient brought in by [facility] from home,  complaining of right leg weakness and numbness  for two days

## 2023-04-23 NOTE — Assessment & Plan Note (Signed)
 Baseline hx/o CAD s/p CABG  No active CP  Cont home regimen including asa and statin

## 2023-04-23 NOTE — ED Notes (Signed)
 First nurse note: Pt to ED via [facility] c/o right leg pain. Started few days ago.was seen at [facility] yesterday and was told to come here

## 2023-04-23 NOTE — Progress Notes (Signed)
 [facility]  ROUNDING NOTE   Subjective:   Anthony Hess is a 68 year old male with past medical conditions including hypertension, PVD, dyslipidemia, CAD, four-vessel CABG, and end-stage renal disease on hemodialysis.   Patient is known to our practice and receives outpatient dialysis at [facility] on a TTS schedule followed by Dr. Cherylann Ratel.  Patient seen on dialysis restless. Patient refused to stay full 3hrs due to not feeling well. Completed 2.10hrs Hemodialysis dialysis treatment flowsheet  Blood flow rate (mL/min): 349 Arterial pressures (mmHg): -226.25 Venous pressures (mmHg): 71.91 TMP (mmHg): 26.46 Ultrafiltration rate (mL/min): 720 Dialysate (mL/min): 299    Objective:  Vital signs in last 24 hours:  Temp:  [98.1 F (36.7 C)-98.4 F (36.9 C)] 98.4 F (36.9 C) (03/01 1527) Pulse Rate:  [38-120] 49 (03/01 1527) Resp:  [14-28] 18 (03/01 1527) BP: (100-166)/(68-145) 115/72 (03/01 1527) SpO2:  [90 %-100 %] 91 % (03/01 1527) Weight:  [53 kg-55.9 kg] 55.4 kg (03/01 1312)  Weight change:  Filed Weights   04/23/23 0413 04/23/23 1032 04/23/23 1312  Weight: 53 kg 55.9 kg 55.4 kg    Intake/Output: No intake/output data recorded.   Intake/Output this shift:  Total I/O In: 72.1 [I.V.:72.1] Out: 500 [Other:500]  Physical Exam: General: NAD,   Head: Normocephalic, atraumatic. Moist oral mucosal membranes  Eyes: Anicteric, PERRL  Neck: Supple, trachea midline  Lungs:  Clear to auscultation  Heart: Regular rate and rhythm  Abdomen:  Soft, nontender,   Extremities:  No peripheral edema.  Neurologic: Nonfocal, moving all four extremities  Skin: No lesions  Access: Rt Chest permcath    Basic Metabolic Panel: Recent Labs  Lab 04/23/23 0420 04/23/23 1415  NA 139 135  K 5.1 3.8  CL 97* 95*  CO2 15* 21*  GLUCOSE 53* 95  BUN 49* 29*  CREATININE 7.28* 4.65*  CALCIUM 7.9* 8.4*    Liver Function Tests: Recent Labs  Lab 04/23/23 1415  AST 38   ALT 22  ALKPHOS 76  BILITOT 0.7  PROT 8.1  ALBUMIN 3.9   No results for input(s): "LIPASE", "AMYLASE" in the last 168 hours. No results for input(s): "AMMONIA" in the last 168 hours.  CBC: Recent Labs  Lab 04/23/23 0420 04/23/23 1415  WBC 14.3* 13.3*  NEUTROABS 11.1* 10.1*  HGB 13.6 12.9*  HCT 46.8 42.6  MCV 91.2 87.3  PLT 272 281    Cardiac Enzymes: No results for input(s): "CKTOTAL", "CKMB", "CKMBINDEX", "TROPONINI" in the last 168 hours.  BNP: Invalid input(s): "POCBNP"  CBG: Recent Labs  Lab 04/23/23 0834  GLUCAP 95    Microbiology: Results for orders placed or performed during the hospital encounter of 01/08/23  Culture, blood (routine x 2)     Status: None   Collection Time: 01/08/23  1:24 PM   Specimen: BLOOD RIGHT ARM  Result Value Ref Range Status   Specimen Description BLOOD RIGHT ARM  Final   Special Requests   Final    BOTTLES DRAWN AEROBIC AND ANAEROBIC Blood Culture results may not be optimal due to an excessive volume of blood received in culture bottles   Culture   Final    NO GROWTH 5 DAYS Performed at [facility], [address], [place], [place] [address]    Report Status 01/13/2023 FINAL  Final  Culture, blood (routine x 2)     Status: None   Collection Time: 01/08/23  2:27 PM   Specimen: BLOOD  Result Value Ref Range Status   Specimen Description  BLOOD BLOOD LEFT FOREARM  Final   Special Requests   Final    BOTTLES DRAWN AEROBIC AND ANAEROBIC Blood Culture adequate volume   Culture   Final    NO GROWTH 5 DAYS Performed at [facility], [address] [place], [place] [address]    Report Status 01/13/2023 FINAL  Final  MRSA Next Gen by PCR, Nasal     Status: None   Collection Time: 01/08/23  5:19 PM   Specimen: Nasal Mucosa; Nasal Swab  Result Value Ref Range Status   MRSA by PCR Next Gen NOT DETECTED NOT DETECTED Final    Comment: (NOTE) The GeneXpert MRSA Assay (FDA approved for NASAL specimens only), is  one component of a comprehensive MRSA colonization surveillance program. It is not intended to diagnose MRSA infection nor to guide or monitor treatment for MRSA infections. Test performance is not FDA approved in patients less than 55 years old. Performed at [facility], [address] [place], [place] [address]     Coagulation Studies: Recent Labs    04/23/23 0510  LABPROT 14.4  INR 1.1    Urinalysis: No results for input(s): "COLORURINE", "LABSPEC", "PHURINE", "GLUCOSEU", "HGBUR", "BILIRUBINUR", "KETONESUR", "PROTEINUR", "UROBILINOGEN", "NITRITE", "LEUKOCYTESUR" in the last 72 hours.  Invalid input(s): "APPERANCEUR"    Imaging: No results found.   Medications:    heparin 1,050 Units/hr (04/23/23 1615)    calcium acetate  2,001 mg Oral TID WC   Chlorhexidine Gluconate Cloth  6 each Topical Q0600   dextrose  12.5 g Intravenous STAT   fentaNYL (SUBLIMAZE) injection  50 mcg Intravenous Once   nicotine  14 mg Transdermal Daily   HYDROmorphone (DILAUDID) injection, ondansetron **OR** ondansetron (ZOFRAN) IV  Assessment/ Plan:  [name] is a 68 y.o.  male [name] is a 68 y.o.  male with past medical conditions ESRD on HD T/T/S, hypertension, dyslipidemia, CAD, four-vessel CABG, COPD and peripheral vascular disease presenting with critical limb ischemia.     End stage renal disease on hemodialysis T/TH/S.  Patient received 2.10hr of dialysis today. Patient refused to stay full treatment due to feeling bad. Outpatient had patient scheduled for CVC exchange Monday. Per vascular note, will exchange tomorrow along with vascular intervention.   2. Anemia of chronic kidney disease Recent Labs       Lab Results  Component Value Date    HGB 12.9 04/23/2023       3. Secondary Hyperparathyroidism:      Recent Labs       Lab Results  Component Value Date    PTH 170 (H) 06/25/2022    CALCIUM 8.4 (L) 04/23/2023    CAION 0.82 (LL) 10/13/2022             Outpatient labs 2/13 phos 15, PTH 321 Will resume home Calcium acetate 3 tab PO TID with meals. Repeat phos pending   4.  Peripheral vascular disease S/p angioplasty of right leg with stent placement on 12/23/2022. Recent thrombectomy and stent placement with vascular 03/07/2023. Still smoking 1/2 PPD Vascular intervention 3/2, NPO at midnight.   LOS: 0 Anthony Hess Anthony Hess 3/1/20254:28 PM

## 2023-04-23 NOTE — Assessment & Plan Note (Signed)
 On HD TTS  Due for HD today

## 2023-04-23 NOTE — Assessment & Plan Note (Addendum)
 PAD w/ claudication  Baseline severe peripheral vascular disease  Acute pain, weakness, mottling on RLE concerning for acute limb ischemia  Noted eval for limb ischemia status post angiogram with angioplasty and stent placement 12/23/2022  Recent thrombectomy and stent placement w/ vascular surgery 03/07/2023  Still smoking 1/2 PPD  Transitioned from eliquis to heparin gtt in ER  Vascular surgery consulted  Follow up recommendations

## 2023-04-23 NOTE — Assessment & Plan Note (Signed)
 2D ECHO 12/2022 w/ EF 55%  HD dependent volume status  Appears euvolemic at present- due for HD today  Monitor volume status  Cont home regimen

## 2023-04-23 NOTE — Assessment & Plan Note (Signed)
1/2 PPD smoker  Discussed cessation at length. Nicotine patch

## 2023-04-23 NOTE — Assessment & Plan Note (Signed)
 Rate controlled at present  Transitioned from eliquis to heparin gtt in setting of limb ischemia  Monitor

## 2023-04-23 NOTE — Assessment & Plan Note (Signed)
 PPI ?

## 2023-04-23 NOTE — Plan of Care (Signed)

## 2023-04-23 NOTE — Assessment & Plan Note (Signed)
 BP 130s-160s/70s-100s  Cont home regimen  Monitor w/ HD  Follow

## 2023-04-23 NOTE — ED Notes (Signed)
 Pt placed on 2L [place]. 89% on RA.

## 2023-04-23 NOTE — H&P (View-Only) (Signed)
 [facility] Vascular Consult Note  MRN : [id number]  Anthony Hess is a 68 y.o. (05/25/1955) male who presents with chief complaint of  Chief Complaint  Patient presents with   Leg Problem  .  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 ESRD on HD via a right internal jugular Permacath T/TH/Sat. S/P LEFT SFA/Pop Stenting/tibial intervention January 2025; RIGHT SFA/Pop stenting and tibial intervention for limb salvage October 2024. On Eliquis, continues to smoke. Presents with ~1 week of progressive RIGHT lower extremity pain. The patient states the right leg/foot became painful and cool about 1 week ago but also elicits that he dropped something on his foot as the starting factor. Nonetheless, he presented to Urgent care and called our office in the last few days with above complaints. Upon evaluation at urgent care he was advised to present to the ER, he refused and felt it would resolve on its own. He presented early this morning to the ER with complaints of worsening foot and leg pain. Upon my evaluation the patient states his foot/leg are about the same, not worsening from last night with current pain control and heparin gtt. The patient also has a malfunctioning Permacath- scheduled for exchange on Monday by our service. The patient states the catheter works intermittently.  Current Facility-Administered Medications  Medication Dose Route Frequency Provider Last Rate Last Admin   Chlorhexidine Gluconate Cloth 2 % PADS 6 each  6 each Topical Q0600 Kolluru, Sarath, MD       dextrose 50 % solution 12.5 g  12.5 g Intravenous STAT Floydene Flock, MD       fentaNYL (SUBLIMAZE) injection 50 mcg  50 mcg Intravenous Once Pilar Jarvis, MD       heparin ADULT infusion 100 units/mL (25000 units/273mL)  850 Units/hr Intravenous Continuous Pilar Jarvis, MD 8.5 mL/hr at 04/23/23 0534 850 Units/hr at 04/23/23 0534   morphine (PF) 2 MG/ML injection 2 mg  2  mg Intravenous Q2H PRN Andris Baumann, MD   2 mg at 04/23/23 0845   nicotine (NICODERM CQ - dosed in mg/24 hours) patch 14 mg  14 mg Transdermal Daily Floydene Flock, MD       ondansetron [facility]) tablet 4 mg  4 mg Oral Q6H PRN Floydene Flock, MD       Or   ondansetron [facility]) injection 4 mg  4 mg Intravenous Q6H PRN Floydene Flock, MD       Current Outpatient Medications  Medication Sig Dispense Refill   apixaban (ELIQUIS) 2.5 MG TABS tablet Take 1 tablet (2.5 mg total) by mouth 2 (two) times daily. 60 tablet 0   aspirin EC 81 MG tablet Take 81 mg by mouth daily.     atorvastatin (LIPITOR) 20 MG tablet Take 1 tablet (20 mg total) by mouth daily. 30 tablet 11   HYDROcodone-acetaminophen (NORCO/VICODIN) 5-325 MG tablet Take 1 tablet by mouth every 6 (six) hours as needed for severe pain (pain score 7-10). 20 tablet 0   isosorbide dinitrate (ISORDIL) 10 MG tablet Take 1 tablet (10 mg total) by mouth 3 (three) times daily. 90 tablet 0   pantoprazole (PROTONIX) 40 MG tablet Take 1 tablet (40 mg total) by mouth 2 (two) times daily before a meal. 60 tablet 0   sevelamer carbonate (RENVELA) 800 MG tablet Take 1,600 mg by mouth 3 (three) times daily.      Past Medical History:  Diagnosis Date   Aortic  atherosclerosis (HCC)    Bilateral carotid artery disease (HCC)    Bladder cancer (HCC)    Coronary artery disease 12/20/2018   a.) LHC 12/20/2018: 50% OM1, 40% OM2, 95% o-pLAD, 75% o=pLCx, 40% mLM, 70% D1, 60% mRCA-1, 50% mRCA-2; refer to CVTS. b.) 4v CABG at [facility] on 12/27/2018: LIMA-LAD, RIMA-PDA, seg LRA-OM1-D1   DCM (dilated cardiomyopathy) (HCC) 12/05/2018   a.) TTE 12/05/2018: EF 40-45%. b.) TTE 12/28/2019: EF 20-25%.   Dyspnea 10/15/2022   ESRD (end stage renal disease) (HCC)    a.) T-Th-Sat   HFrEF (heart failure with reduced ejection fraction) (HCC) 12/05/2018   a.) TTE 12/05/2018: EF 40-45%; mild LVH; ant/apical/sep HK; mild TR . b.) TTE 12/28/2019: EF 20-25%; mod LVH; mod MR/AR;  G1DD.   History of 2019 novel coronavirus disease (COVID-19) 04/08/2021   History of kidney stones    HLD (hyperlipidemia)    Hx of CABG 12/27/2018   Hypertension    Infrarenal abdominal aortic aneurysm (AAA) without rupture (HCC) 03/05/2021   a.) CT abd/pelvis; measured 3.2 cm.   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   a.) LIMA-LAD, RIMA-PDA, sequential LEFT radial artery to OM1 and D1   Sepsis (HCC) 03/14/2021   Wears glasses     Past Surgical History:  Procedure Laterality Date   AV FISTULA PLACEMENT Left 07/30/2021   Procedure: INSERTION OF ARTERIOVENOUS (AV) GORE-TEX GRAFT ARM BRACHIAL ARTERY TO AXILLARY VEIN;  Surgeon: Annice Needy, MD;  Location: [facility] ORS;  Service: Vascular;  Laterality: Left;   CAPD INSERTION N/A 12/31/2019   Procedure: LAPAROSCOPIC INSERTION CONTINUOUS AMBULATORY PERITONEAL DIALYSIS  (CAPD) CATHETER;  Surgeon: Leafy Ro, MD;  Location: [facility] ORS;  Service: General;  Laterality: N/A;   CAPD REMOVAL N/A 04/10/2020   Procedure: LAPAROSCOPIC REVISION OF CONTINUOUS AMBULATORY PERITONEAL DIALYSIS  (CAPD) CATHETER;  Surgeon: Leafy Ro, MD;  Location: [facility];  Service: General;  Laterality: N/A;   CORONARY ARTERY BYPASS GRAFT N/A 12/27/2018   Procedure: CORONARY ARTERY BYPASS GRAFTING (CABG) X 4 ON PUMP USING RIGHT & LEFT INTERNAL MAMMARY ARTERY LEFT RADIAL ARTERY ENDOSCOPICALLY HARVESTED;  Surgeon: Linden Dolin, MD;  Location: [facility];  Service: Open Heart Surgery;  Laterality: N/A;   CYSTOSCOPY W/ RETROGRADES Bilateral 05/15/2019   Procedure: CYSTOSCOPY WITH RETROGRADE PYELOGRAM;  Surgeon: Riki Altes, MD;  Location: [facility] OR[facility];  Service: Urology;  Laterality: Bilateral;   CYSTOSCOPY WITH BIOPSY N/A 05/15/2019   Procedure: CYSTOSCOPY WITH bladder BIOPSY;  Surgeon: Riki Altes, MD;  Location: [facility];  Service: Urology;  Laterality: N/A;   DIALYSIS/PERMA CATHETER INSERTION N/A 12/28/2019   Procedure: DIALYSIS/PERMA CATHETER INSERTION;  Surgeon: Annice Needy, MD;  Location: [facility] INVASIVE [facility];  Service: Cardiovascular;  Laterality: N/A;   DIALYSIS/PERMA CATHETER INSERTION N/A 03/18/2021   Procedure: DIALYSIS/PERMA CATHETER INSERTION;  Surgeon: Annice Needy, MD;  Location: [facility];  Service: Cardiovascular;  Laterality: N/A;   DIALYSIS/PERMA CATHETER REMOVAL N/A 06/02/2020   Procedure: DIALYSIS/PERMA CATHETER REMOVAL;  Surgeon: Annice Needy, MD;  Location: [facility];  Service: Cardiovascular;  Laterality: N/A;   DIALYSIS/PERMA CATHETER REPAIR N/A 10/22/2022   Procedure: DIALYSIS/PERMA CATHETER REPAIR;  Surgeon: Renford Dills, MD;  Location: [facility];  Service: Cardiovascular;  Laterality: N/A;   ESOPHAGOGASTRODUODENOSCOPY N/A  01/09/2023   Procedure: ESOPHAGOGASTRODUODENOSCOPY (EGD);  Surgeon: Toledo, Boykin Nearing, MD;  Location: [facility] END[facility];  Service: Gastroenterology;  Laterality: N/A;   EXCHANGE OF A DIALYSIS CATHETER Right 04/10/2020   Procedure: EXCHANGE OF A DIALYSIS CATHETER;  Surgeon: Leafy Ro, MD;  Location: [facility];  Service: General;  Laterality: Right;   HEMOSTASIS CONTROL  01/09/2023   Procedure: HEMOSTASIS CONTROL;  Surgeon: Norma Fredrickson, Boykin Nearing, MD;  Location: [facility];  Service: Gastroenterology;;   [name] HERNIA REPAIR  01/20/2021   Procedure: HERNIA REPAIR INCISIONAL;  Surgeon: Henrene Dodge, MD;  Location: [facility];  Service: General;;   IR IMAGE GUIDED DRAINAGE PERCUT CATH  PERITONEAL RETROPERIT  04/07/2020   LAPAROSCOPY N/A 04/16/2021   Procedure: LAPAROSCOPY DIAGNOSTIC;  Surgeon: Sung Amabile, DO;  Location: [facility];  Service: General;  Laterality: N/A;   LAPAROTOMY N/A 04/26/2022   Procedure: EXPLORATORY LAPAROTOMY WITH REPAIR OF DUODENAL ULCER;  Surgeon: Carolan Shiver, MD;   Location: [facility];  Service: General;  Laterality: N/A;   LEFT HEART CATH AND CORONARY ANGIOGRAPHY Left 12/20/2018   Procedure: LEFT HEART CATH AND CORONARY ANGIOGRAPHY;  Surgeon: Marcina Millard, MD;  Location: [facility];  Service: Cardiovascular;  Laterality: Left;   LEFT HEART CATH AND CORONARY ANGIOGRAPHY N/A 01/12/2023   Procedure: LEFT HEART CATH AND CORONARY ANGIOGRAPHY;  Surgeon: Marcina Millard, MD;  Location: [facility];  Service: Cardiovascular;  Laterality: N/A;   LOWER EXTREMITY ANGIOGRAPHY Right 12/23/2022   Procedure: Lower Extremity Angiography;  Surgeon: Annice Needy, MD;  Location: [facility];  Service: Cardiovascular;  Laterality: Right;   LOWER EXTREMITY ANGIOGRAPHY Left 03/07/2023   Procedure: Lower Extremity Angiography;  Surgeon: Annice Needy, MD;  Location: [facility];  Service: Cardiovascular;  Laterality: Left;   RADIAL ARTERY HARVEST Left 12/27/2018   Procedure: ENDOSCOPIC RADIAL ARTERY HARVEST;  Surgeon: Linden Dolin, MD;  Location: [facility];  Service: Open Heart Surgery;  Laterality: Left;   REMOVAL OF A DIALYSIS CATHETER Left 03/20/2021   Procedure: REMOVAL OF A PD CATHETER;  Surgeon: Annice Needy, MD;  Location: [facility];  Service: Vascular;  Laterality: Left;   REVISION OF ARTERIOVENOUS GORETEX GRAFT Left 09/11/2021   Procedure: Excisionof infected AV graft;  Surgeon: Renford Dills, MD;  Location: [facility];  Service: Vascular;  Laterality: Left;   SUBMUCOSAL INJECTION  01/09/2023   Procedure: SUBMUCOSAL INJECTION;  Surgeon: Toledo, Boykin Nearing, MD;  Location: [facility] ENDOSCOPY;  Service: Gastroenterology;;   TEE WITHOUT CARDIOVERSION N/A 12/27/2018   Procedure: TRANSESOPHAGEAL ECHOCARDIOGRAM (TEE);  Surgeon: Linden Dolin, MD;  Location: [facility] OR;  Service: Open Heart Surgery;  Laterality: N/A;   TUMOR REMOVAL  2019   Bladder    Social History Social History   Tobacco Use   Smoking status: Former    Types:  Cigarettes    Start date: 12/2022    Quit date: 02/19/2019    Years since quitting: 4.1   Smokeless tobacco: Never  Vaping Use   Vaping status: Never Used  Substance Use Topics   Alcohol use: Never   Drug use: Never    Family History Family History  Problem Relation Age of Onset   Heart failure Mother     No Known Allergies   REVIEW OF SYSTEMS (Negative unless checked)  Constitutional: [] Weight loss  [] Fever  [] Chills Cardiac: [] Chest pain   [] Chest pressure   [] Palpitations   [] Shortness of breath when laying flat   [] Shortness of  breath at rest   [] Shortness of breath with exertion. Vascular:  [x] Pain in legs with walking   [x] Pain in legs at rest   [] Pain in legs when laying flat   [] Claudication   [] Pain in feet when walking  [] Pain in feet at rest  [] Pain in feet when laying flat   [] History of DVT   [] Phlebitis   [] Swelling in legs   [] Varicose veins   [] Non-healing ulcers Pulmonary:   [] Uses home oxygen   [] Productive cough   [] Hemoptysis   [] Wheeze  [] COPD   [] Asthma Neurologic:  [] Dizziness  [] Blackouts   [] Seizures   [] History of stroke   [] History of TIA  [] Aphasia   [] Temporary blindness   [] Dysphagia   [] Weakness or numbness in arms   [] Weakness or numbness in legs Musculoskeletal:  [] Arthritis   [] Joint swelling   [] Joint pain   [] Low back pain Hematologic:  [] Easy bruising  [] Easy bleeding   [] Hypercoagulable state   [] Anemic  [] Hepatitis Gastrointestinal:  [] Blood in stool   [] Vomiting blood  [] Gastroesophageal reflux/heartburn   [] Difficulty swallowing. Genitourinary:  [x] Chronic kidney disease   [] Difficult urination  [] Frequent urination  [] Burning with urination   [] Blood in urine Skin:  [] Rashes   [] Ulcers   [] Wounds Psychological:  [] History of anxiety   []  History of major depression.  Physical Examination  Vitals:   04/23/23 0540 04/23/23 0600 04/23/23 0630 04/23/23 0800  BP: (!) 155/105 (!) 166/145 (!) 162/73 137/86  Pulse: 79  72 78  Resp:   18 15   Temp:      TempSrc:      SpO2: 94%  91% 90%  Weight:      Height:       Body mass index is 21.37 kg/m. Gen:  WD/WN, NAD Head: [place]/AT, No temporalis wasting. Prominent temp pulse not noted. Pulmonary:  Good air movement, respirations not labored, equal bilaterally. RIGHT internal jugular Permacath in place Cardiac: RRR, normal S1, S2. Vascular:  Vessel Right Left  Radial Palpable Palpable  Ulnar    Brachial    Carotid    Aorta Not palpable N/A  Femoral Palpable Palpable  Popliteal    PT    DP Not palpable Not Palpable   Gastrointestinal: soft, non-tender/non-distended. No guarding/reflex.  Musculoskeletal: M/S 5/5 throughout.  RIGHT lower extremity- warm to midfoot, distal leg and foot with some discoloration; toes slightly cool, +motor/+sensory intact Neurologic: Sensation grossly intact in extremities.  Symmetrical.  Speech is fluent. Motor exam as listed above. Psychiatric: Judgment intact, Mood & affect appropriate for pt's clinical situation.      CBC Lab Results  Component Value Date   WBC 14.3 (H) 04/23/2023   HGB 13.6 04/23/2023   HCT 46.8 04/23/2023   MCV 91.2 04/23/2023   PLT 272 04/23/2023    BMET    Component Value Date/Time   NA 139 04/23/2023 0420   NA 141 06/25/2022 1414   K 5.1 04/23/2023 0420   CL 97 (L) 04/23/2023 0420   CO2 15 (L) 04/23/2023 0420   GLUCOSE 53 (L) 04/23/2023 0420   BUN 49 (H) 04/23/2023 0420   BUN 39 (H) 06/25/2022 1414   CREATININE 7.28 (H) 04/23/2023 0420   CREATININE 4.62 (H) 01/10/2019 1351   CALCIUM 7.9 (L) 04/23/2023 0420   GFRNONAA 8 (L) 04/23/2023 0420   GFRAA 15 (L) 01/04/2020 1542   Estimated Creatinine Clearance: 7.6 mL/min (A) (by C-G formula based on SCr of 7.28 mg/dL (H)).  COAG Lab Results  Component Value Date   INR 1.1 04/23/2023   INR 1.2 01/08/2023   INR 1.3 (H) 12/21/2022    Radiology No results found.    Assessment/Plan 1. Acute on Chronic critical RIGHT lower extremity ischemia.      Heparin gtt; pain control; plan for intervention tomorrow morning per Dr. Wyn Quaker 2. Malfunctioning RIGHT internal jugular dialysis catheter    Patient was scheduled for Monday for exchange- will exchange tomorrow also; OK for HD today per Nephrology. NPO after MN Consent on chart    Bertram Denver, MD  04/23/2023 10:02 AM    This note was created with Dragon medical transcription system.  Any error is purely unintentional

## 2023-04-23 NOTE — Assessment & Plan Note (Signed)
 Stable from a resp standpoint  Cont home inhalers

## 2023-04-23 NOTE — H&P (Signed)
 History and Physical    Patient: Anthony Hess ZOX:[id number] DOB: [birth year] DOA: 04/23/2023 DOS: the patient was seen and examined on 04/23/2023 PCP: Ronnald Ramp, MD  Patient coming from: Home  Chief Complaint:  Chief Complaint  Patient presents with   Leg Problem   HPI: Anthony Hess is a 68 y.o. male with medical history significant of hypertension, atrial fibrillation on Eliquis, ESRD on hemodialysis TTS through right tunneled IJ catheter, COPD, peripheral vascular disease presenting with critical limb ischemia.  Patient noted had multiple admissions with similar issues in the past including admission October 2024 for limb ischemia status post angiogram angioplasty and stent placement.  Also with Recent thrombectomy and stent placement w/ vascular surgery 03/07/2023.  Per the patient, patient has had pain weakness and mottling of the right lower extremity x 2 days.  On Eliquis.  Reports compliance with medication.  Smoking 1/2 pack/day.  No chest pain or shortness of breath.  No nausea or vomiting.  Noted admission November 2024 for GI bleeding.  Denies any black or bloody stools at present.  No chest pain or shortness of breath.  No abdominal pain or diarrhea.  Reports no trauma.  Baseline ESRD on hemodialysis Tuesday Thursday Saturday.  Denies any missed episodes. Presented to the ER afebrile, hemodynamically stable.  Satting well on room air.  White count 14.3, hemoglobin 13.6, platelets 272, lactate 1.8, creatinine 7.3.RLE with overt skin mottling and discoloration.  Review of Systems: As mentioned in the history of present illness. All other systems reviewed and are negative. Past Medical History:  Diagnosis Date   Aortic atherosclerosis (HCC)    Bilateral carotid artery disease (HCC)    Bladder cancer (HCC)    Coronary artery disease 12/20/2018   a.) LHC 12/20/2018: 50% OM1, 40% OM2, 95% o-pLAD, 75% o=pLCx, 40% mLM, 70% D1, 60% mRCA-1, 50% mRCA-2; refer to CVTS. b.) 4v CABG  at [facility] on 12/27/2018: LIMA-LAD, RIMA-PDA, seg LRA-OM1-D1   DCM (dilated cardiomyopathy) (HCC) 12/05/2018   a.) TTE 12/05/2018: EF 40-45%. b.) TTE 12/28/2019: EF 20-25%.   Dyspnea 10/15/2022   ESRD (end stage renal disease) (HCC)    a.) T-Th-Sat   HFrEF (heart failure with reduced ejection fraction) (HCC) 12/05/2018   a.) TTE 12/05/2018: EF 40-45%; mild LVH; ant/apical/sep HK; mild TR . b.) TTE 12/28/2019: EF 20-25%; mod LVH; mod MR/AR; G1DD.   History of 2019 novel coronavirus disease (COVID-19) 04/08/2021   History of kidney stones    HLD (hyperlipidemia)    Hx of CABG 12/27/2018   Hypertension    Infrarenal abdominal aortic aneurysm (AAA) without rupture (HCC) 03/05/2021   a.) CT abd/pelvis; measured 3.2 cm.   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   a.) LIMA-LAD, RIMA-PDA, sequential LEFT radial artery to OM1 and D1   Sepsis (HCC) 03/14/2021   Wears glasses    Past Surgical History:  Procedure Laterality Date   AV FISTULA PLACEMENT Left 07/30/2021   Procedure: INSERTION OF ARTERIOVENOUS (AV) GORE-TEX GRAFT ARM BRACHIAL ARTERY TO AXILLARY VEIN;  Surgeon: Annice Needy, MD;  Location: [facility] ORS;  Service: Vascular;  Laterality: Left;   CAPD INSERTION N/A 12/31/2019   Procedure: LAPAROSCOPIC INSERTION CONTINUOUS AMBULATORY PERITONEAL DIALYSIS  (CAPD) CATHETER;  Surgeon: Leafy Ro, MD;  Location: [facility] ORS;  Service: General;  Laterality: N/A;  CAPD REMOVAL N/A 04/10/2020   Procedure: LAPAROSCOPIC REVISION OF CONTINUOUS AMBULATORY PERITONEAL DIALYSIS  (CAPD) CATHETER;  Surgeon: Leafy Ro, MD;  Location: [facility] OR[facility];  Service: General;  Laterality: N/A;   CORONARY ARTERY BYPASS GRAFT N/A 12/27/2018   Procedure: CORONARY ARTERY BYPASS GRAFTING (CABG) X 4 ON PUMP USING RIGHT & LEFT INTERNAL MAMMARY ARTERY  LEFT RADIAL ARTERY ENDOSCOPICALLY HARVESTED;  Surgeon: Linden Dolin, MD;  Location: [facility];  Service: Open Heart Surgery;  Laterality: N/A;   CYSTOSCOPY W/ RETROGRADES Bilateral 05/15/2019   Procedure: CYSTOSCOPY WITH RETROGRADE PYELOGRAM;  Surgeon: Riki Altes, MD;  Location: [facility] OR[facility];  Service: Urology;  Laterality: Bilateral;   CYSTOSCOPY WITH BIOPSY N/A 05/15/2019   Procedure: CYSTOSCOPY WITH bladder BIOPSY;  Surgeon: Riki Altes, MD;  Location: [facility];  Service: Urology;  Laterality: N/A;   DIALYSIS/PERMA CATHETER INSERTION N/A 12/28/2019   Procedure: DIALYSIS/PERMA CATHETER INSERTION;  Surgeon: Annice Needy, MD;  Location: [facility];  Service: Cardiovascular;  Laterality: N/A;   DIALYSIS/PERMA CATHETER INSERTION N/A 03/18/2021   Procedure: DIALYSIS/PERMA CATHETER INSERTION;  Surgeon: Annice Needy, MD;  Location: [facility];  Service: Cardiovascular;  Laterality: N/A;   DIALYSIS/PERMA CATHETER REMOVAL N/A 06/02/2020   Procedure: DIALYSIS/PERMA CATHETER REMOVAL;  Surgeon: Annice Needy, MD;  Location: [facility];  Service: Cardiovascular;  Laterality: N/A;   DIALYSIS/PERMA CATHETER REPAIR N/A 10/22/2022   Procedure: DIALYSIS/PERMA CATHETER REPAIR;  Surgeon: Renford Dills, MD;  Location: [facility];  Service: Cardiovascular;  Laterality: N/A;   ESOPHAGOGASTRODUODENOSCOPY N/A 01/09/2023   Procedure: ESOPHAGOGASTRODUODENOSCOPY (EGD);  Surgeon: Toledo, Boykin Nearing, MD;  Location: [facility];  Service: Gastroenterology;  Laterality: N/A;   EXCHANGE OF A DIALYSIS CATHETER Right 04/10/2020   Procedure: EXCHANGE OF A DIALYSIS CATHETER;  Surgeon: Leafy Ro, MD;  Location: [facility];  Service: General;  Laterality: Right;   HEMOSTASIS CONTROL  01/09/2023   Procedure: HEMOSTASIS CONTROL;  Surgeon: Norma Fredrickson, Boykin Nearing, MD;  Location: [facility];  Service: Gastroenterology;;   Anthony Hess HERNIA REPAIR  01/20/2021   Procedure: HERNIA REPAIR  INCISIONAL;  Surgeon: Henrene Dodge, MD;  Location: [facility];  Service: General;;   IR IMAGE GUIDED DRAINAGE PERCUT CATH  PERITONEAL RETROPERIT  04/07/2020   LAPAROSCOPY N/A 04/16/2021   Procedure: LAPAROSCOPY DIAGNOSTIC;  Surgeon: Sung Amabile, DO;  Location: [facility];  Service: General;  Laterality: N/A;   LAPAROTOMY N/A 04/26/2022   Procedure: EXPLORATORY LAPAROTOMY WITH REPAIR OF DUODENAL ULCER;  Surgeon: Carolan Shiver, MD;  Location: [facility];  Service: General;  Laterality: N/A;   LEFT HEART CATH AND CORONARY ANGIOGRAPHY Left 12/20/2018   Procedure: LEFT HEART CATH AND CORONARY ANGIOGRAPHY;  Surgeon: Marcina Millard, MD;  Location: [facility];  Service: Cardiovascular;  Laterality: Left;   LEFT HEART CATH AND CORONARY ANGIOGRAPHY N/A 01/12/2023   Procedure: LEFT HEART CATH AND CORONARY ANGIOGRAPHY;  Surgeon: Marcina Millard, MD;  Location: [facility];  Service: Cardiovascular;  Laterality: N/A;   LOWER EXTREMITY ANGIOGRAPHY Right 12/23/2022   Procedure: Lower Extremity Angiography;  Surgeon: Annice Needy, MD;  Location: [facility];  Service: Cardiovascular;  Laterality: Right;   LOWER EXTREMITY ANGIOGRAPHY Left 03/07/2023   Procedure: Lower Extremity Angiography;  Surgeon: Annice Needy, MD;  Location: [facility];  Service: Cardiovascular;  Laterality: Left;   RADIAL ARTERY HARVEST Left 12/27/2018   Procedure: ENDOSCOPIC RADIAL ARTERY HARVEST;  Surgeon: Linden Dolin, MD;  Location: [facility];  Service: Open Heart Surgery;  Laterality: Left;   REMOVAL OF A DIALYSIS CATHETER Left 03/20/2021   Procedure: REMOVAL OF A PD CATHETER;  Surgeon: Annice Needy, MD;  Location: [facility];  Service: Vascular;  Laterality: Left;   REVISION OF ARTERIOVENOUS GORETEX GRAFT Left 09/11/2021   Procedure: Excisionof infected AV graft;  Surgeon: Renford Dills, MD;  Location: [facility];  Service: Vascular;  Laterality: Left;   SUBMUCOSAL INJECTION  01/09/2023    Procedure: SUBMUCOSAL INJECTION;  Surgeon: Toledo, Boykin Nearing, MD;  Location[facility] END[facility]COPY;  Service: Gastroenterology;;   TEE WITHOUT CARDIOVERSION N/A 12/27/2018   Procedure: TRANSESOPHAGEAL ECHOCARDIOGRAM (TEE);  Surgeon: Linden Dolin, MD;  Location: [facility] OR;  Service: Open Heart Surgery;  Laterality: N/A;   TUMOR REMOVAL  2019   Bladder   Social History:  reports that he quit smoking about 4 years ago. His smoking use included cigarettes. He started smoking about 3 months ago. He has never used smokeless tobacco. He reports that he does not drink alcohol and does not use drugs.  No Known Allergies  Family History  Problem Relation Age of Onset   Heart failure Mother     Prior to Admission medications   Medication Sig Start Date End Date Taking? Authorizing Provider  apixaban (ELIQUIS) 2.5 MG TABS tablet Take 1 tablet (2.5 mg total) by mouth 2 (two) times daily. 01/13/23 03/03/23  Delfino Lovett, MD  aspirin EC 81 MG tablet Take 81 mg by mouth daily. 05/06/20   [provider]  atorvastatin (LIPITOR) 20 MG tablet Take 1 tablet (20 mg total) by mouth daily. 12/25/22   Pace, Peggye Ley, NP  HYDROcodone-acetaminophen (NORCO/VICODIN) 5-325 MG tablet Take 1 tablet by mouth every 6 (six) hours as needed for severe pain (pain score 7-10). 03/08/23 03/07/24  Georgiana Spinner, NP  isosorbide dinitrate (ISORDIL) 10 MG tablet Take 1 tablet (10 mg total) by mouth 3 (three) times daily. 01/13/23 03/03/23  Delfino Lovett, MD  pantoprazole (PROTONIX) 40 MG tablet Take 1 tablet (40 mg total) by mouth 2 (two) times daily before a meal. 01/13/23 02/12/23  Delfino Lovett, MD  sevelamer carbonate (RENVELA) 800 MG tablet Take 1,600 mg by mouth 3 (three) times daily. 11/16/22   [provider]    Physical Exam: Vitals:   04/23/23 0413 04/23/23 0540 04/23/23 0600 04/23/23 0630  BP:  (!) 155/105 (!) 166/145 (!) 162/73  Pulse:  79  72  Resp:      Temp:      TempSrc:      SpO2:  94%  91%  Weight: 53 kg      Height: 5\' 2"  (1.575 m)      Physical Exam Constitutional:      Comments: Underweight  Cachectic   HENT:     Head: Normocephalic and atraumatic.     Nose: Nose normal.     Mouth/Throat:     Mouth: Mucous membranes are moist.  Eyes:     Pupils: Pupils are equal, round, and reactive to light.  Cardiovascular:     Rate and Rhythm: Normal rate and regular rhythm.  Pulmonary:     Effort: Pulmonary effort is normal.  Abdominal:     General: Bowel sounds are normal.  Musculoskeletal:        General: Normal range of motion.     Cervical back: Normal range of motion.  Skin:    Comments: + RLE mottling    Neurological:  General: No focal deficit present.  Psychiatric:        Mood and Affect: Mood normal.     Data Reviewed:  There are no new results to review at this time.  PERIPHERAL VASCULAR CATHETERIZATION See surgical note for result.  Lab Results  Component Value Date   WBC 14.3 (H) 04/23/2023   HGB 13.6 04/23/2023   HCT 46.8 04/23/2023   MCV 91.2 04/23/2023   PLT 272 04/23/2023   Last metabolic panel Lab Results  Component Value Date   GLUCOSE 53 (L) 04/23/2023   NA 139 04/23/2023   K 5.1 04/23/2023   CL 97 (L) 04/23/2023   CO2 15 (L) 04/23/2023   BUN 49 (H) 04/23/2023   CREATININE 7.28 (H) 04/23/2023   GFRNONAA 8 (L) 04/23/2023   CALCIUM 7.9 (L) 04/23/2023   PHOS 2.6 01/11/2023   PROT 5.2 (L) 01/08/2023   ALBUMIN 2.6 (L) 01/11/2023   LABGLOB 2.4 06/25/2022   AGRATIO 1.8 06/25/2022   BILITOT 0.2 01/08/2023   ALKPHOS 45 01/08/2023   AST 28 01/08/2023   ALT 17 01/08/2023   ANIONGAP 27 (H) 04/23/2023    Assessment and Plan: Critical limb ischemia of right lower extremity (HCC) PAD w/ claudication  Baseline severe peripheral vascular disease  Acute pain, weakness, mottling on RLE concerning for acute limb ischemia  Noted eval for limb ischemia status post angiogram with angioplasty and stent placement 12/23/2022  Recent thrombectomy and stent  placement w/ vascular surgery 03/07/2023  Still smoking 1/2 PPD  Transitioned from eliquis to heparin gtt in ER  Vascular surgery consulted  Follow up recommendations    COPD (chronic obstructive pulmonary disease) (HCC) Stable from a resp standpoint  Cont home inhalers    (HFpEF) heart failure with preserved ejection fraction (HCC) 2D ECHO 12/2022 w/ EF 55%  HD dependent volume status  Appears euvolemic at present- due for HD today  Monitor volume status  Cont home regimen    Essential hypertension BP 130s-160s/70s-100s  Cont home regimen  Monitor w/ HD  Follow   Tobacco dependence 1/2 PPD smoker  Discussed cessation at length  Nicotine patch    Atrial fibrillation, chronic (HCC) Rate controlled at present  Transitioned from eliquis to heparin gtt in setting of limb ischemia  Monitor  GERD without esophagitis PPI  CAD (coronary artery disease) Baseline hx/o CAD s/p CABG  No active CP  Cont home regimen including asa and statin    Hyperlipidemia, mixed Cont statin    End stage renal disease (HCC) On HD TTS  Due for HD today        Advance Care Planning:   Code Status: Full Code   Consults: Vascular Surgery   Family Communication: No family at the bedside   Severity of Illness: The appropriate patient status for this patient is INPATIENT. Inpatient status is judged to be reasonable and necessary in order to provide the required intensity of service to ensure the patient's safety. The patient's presenting symptoms, physical exam findings, and initial radiographic and laboratory data in the context of their chronic comorbidities is felt to place them at high risk for further clinical deterioration. Furthermore, it is not anticipated that the patient will be medically stable for discharge from the hospital within 2 midnights of admission.   * I certify that at the point of admission it is my clinical judgment that the patient will require inpatient  hospital care spanning beyond 2 midnights from the point of admission due to high intensity  of service, high risk for further deterioration and high frequency of surveillance required.*  Author: Floydene Flock, MD 04/23/2023 8:06 AM  For on call review [email or web address].

## 2023-04-23 NOTE — Progress Notes (Signed)
 Hemodialysis Note:  Received patient in bed to unit. Alert and oriented. Informed consent singed and in chart.  Treatment initiated: 1100 Treatment completed: 1312  Access used: Right internal jugular catheter Access issues: None  Patient requested to end treatment 50 minutes early and signed AMA form. HD NP notified.  Transported back to room, alert without acute distress. Report given to patient's RN.  Total UF removed: 500 ml Medications given: None  Post HD weight: 55.4 Kg  [facility]

## 2023-04-23 NOTE — Progress Notes (Signed)
 PHARMACY - ANTICOAGULATION CONSULT NOTE  Pharmacy Consult for Heparin  Indication: limb ischemia   No Known Allergies  Patient Measurements: Height: 5\' 2"  (157.5 cm) Weight: 55.4 kg (122 lb 2.2 oz) IBW/kg (Calculated) : 54.6 Heparin Dosing Weight: 53 kg   Vital Signs: Temp: 98.3 F (36.8 C) (03/01 1358) Temp Source: Oral (03/01 1358) BP: 102/68 (03/01 1358) Pulse Rate: 92 (03/01 1358)  Labs: Recent Labs    04/23/23 0420 04/23/23 0510 04/23/23 1415  HGB 13.6  --  12.9*  HCT 46.8  --  42.6  PLT 272  --  281  APTT  --  24 44*  LABPROT  --  14.4  --   INR  --  1.1  --   HEPARINUNFRC  --  <0.10*  --   CREATININE 7.28*  --  4.65*    Estimated Creatinine Clearance: 12.2 mL/min (A) (by C-G formula based on SCr of 4.65 mg/dL (H)).   Medical History: Past Medical History:  Diagnosis Date   Aortic atherosclerosis (HCC)    Bilateral carotid artery disease (HCC)    Bladder cancer (HCC)    Coronary artery disease 12/20/2018   a.) LHC 12/20/2018: 50% OM1, 40% OM2, 95% o-pLAD, 75% o=pLCx, 40% mLM, 70% D1, 60% mRCA-1, 50% mRCA-2; refer to CVTS. b.) 4v CABG at [facility] on 12/27/2018: LIMA-LAD, RIMA-PDA, seg LRA-OM1-D1   DCM (dilated cardiomyopathy) (HCC) 12/05/2018   a.) TTE 12/05/2018: EF 40-45%. b.) TTE 12/28/2019: EF 20-25%.   Dyspnea 10/15/2022   ESRD (end stage renal disease) (HCC)    a.) T-Th-Sat   HFrEF (heart failure with reduced ejection fraction) (HCC) 12/05/2018   a.) TTE 12/05/2018: EF 40-45%; mild LVH; ant/apical/sep HK; mild TR . b.) TTE 12/28/2019: EF 20-25%; mod LVH; mod MR/AR; G1DD.   History of 2019 novel coronavirus disease (COVID-19) 04/08/2021   History of kidney stones    HLD (hyperlipidemia)    Hx of CABG 12/27/2018   Hypertension    Infrarenal abdominal aortic aneurysm (AAA) without rupture (HCC) 03/05/2021   a.) CT abd/pelvis; measured 3.2 cm.   Melena 05/04/2022   Myocardial infarction [facility])    NSTEMI (non-ST elevated myocardial infarction) (HCC)  04/19/2022   Perforation bowel (HCC) 04/26/2022   PVD (peripheral vascular disease) (HCC)    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   a.) LIMA-LAD, RIMA-PDA, sequential LEFT radial artery to OM1 and D1   Sepsis (HCC) 03/14/2021   Wears glasses     Medications:  Medications Prior to Admission  Medication Sig Dispense Refill Last Dose/Taking   apixaban (ELIQUIS) 2.5 MG TABS tablet Take 1 tablet (2.5 mg total) by mouth 2 (two) times daily. 60 tablet 0    aspirin EC 81 MG tablet Take 81 mg by mouth daily.      atorvastatin (LIPITOR) 20 MG tablet Take 1 tablet (20 mg total) by mouth daily. 30 tablet 11    HYDROcodone-acetaminophen (NORCO/VICODIN) 5-325 MG tablet Take 1 tablet by mouth every 6 (six) hours as needed for severe pain (pain score 7-10). 20 tablet 0    isosorbide dinitrate (ISORDIL) 10 MG tablet Take 1 tablet (10 mg total) by mouth 3 (three) times daily. 90 tablet 0    pantoprazole (PROTONIX) 40 MG tablet Take 1 tablet (40 mg total) by mouth 2 (two) times daily before a meal. 60 tablet 0    sevelamer carbonate (RENVELA) 800 MG tablet Take 1,600 mg by mouth 3 (three) times daily.       Assessment: Pharmacy consulted to  dose heparin in this 68 year old male admitted with limb ischemia.  Pt was on Eliquis 5 mg PO BID PTA, last dose on 2/27 at unknown time.  CrCl = 8 ml/min   3/1 1415 aPTT 44 Subtherapeutic  inc from 850 to 1050 units/hr   Goal of Therapy:  Heparin level 0.3-0.7 units/ml aPTT 66 - 102  seconds Monitor platelets by anticoagulation protocol: Yes   Plan:  Give 1600 units bolus x 1 increase heparin infusion to 1050 units/hr - Will use aPTT to guide dosing unitil correlating with HL - Will check aPTT 8 hrs after rate change - vascular planning intervention in am of 04/24/23 - CBC daily  Bari Mantis PharmD Clinical Pharmacist 04/23/2023

## 2023-04-23 NOTE — ED Provider Notes (Signed)
 [facility] Provider Note    Event Date/Time   First MD Initiated Contact with Patient 04/23/23 [phone]     (approximate)   History   Leg Problem   HPI  Anthony Hess is a 68 y.o. male   Past medical history of end-stage renal disease on hemodialysis Tuesday, Thursday, Saturday, peripheral vascular disease, he is on Eliquis and he comes into Emergency Department with several days of claudication symptoms to the right lower extremity and a mottled, painful, blue and cold right foot that extends up to the calf.  He was seen in urgent care yesterday and told to come to the emergency department immediately for evaluation of ischemic limb however he decided to wait and see if it got better on its own.  It did not get better on its own.     Physical Exam   Triage Vital Signs: ED Triage Vitals  Encounter Vitals Group     BP 04/23/23 0412 (!) 137/106     Systolic BP Percentile --      Diastolic BP Percentile --      Pulse Rate 04/23/23 0412 (!) 112     Resp 04/23/23 0412 18     Temp 04/23/23 0412 98.2 F (36.8 C)     Temp Source 04/23/23 0412 Oral     SpO2 04/23/23 0412 96 %     Weight 04/23/23 0413 116 lb 13.5 oz (53 kg)     Height 04/23/23 0413 5\' 2"  (1.575 m)     Head Circumference --      Peak Flow --      Pain Score 04/23/23 0413 8     Pain Loc --      Pain Education --      Exclude from Growth Chart --     Most recent vital signs: Vitals:   04/23/23 0412  BP: (!) 137/106  Pulse: (!) 112  Resp: 18  Temp: 98.2 F (36.8 C)  SpO2: 96%    General: Awake, no distress.  CV:  Good peripheral perfusion.  Resp:  Normal effort.  Abd:  No distention.  Other:  He has a cold right foot and calf compared to the left.  I cannot feel a pulse nor Doppler 1.  He has tenderness to palpation of the foot and calf.  He is able to range gingerly.  It appears red mottled slightly blue in areas.   ED Results / Procedures / Treatments   Labs (all labs ordered  are listed, but only abnormal results are displayed) Labs Reviewed  CBC WITH DIFFERENTIAL/PLATELET - Abnormal; Notable for the following components:      Result Value   WBC 14.3 (*)    MCHC 29.1 (*)    RDW 21.2 (*)    All other components within normal limits  BASIC METABOLIC PANEL  PROTIME-INR  APTT  LACTIC ACID, PLASMA  LACTIC ACID, PLASMA  TYPE AND SCREEN     I ordered and reviewed the above labs they are notable for white blood cell count elevated 14.3.    PROCEDURES:  Critical Care performed: Yes, see critical care procedure note(s)  .Critical Care  Performed by: Pilar Jarvis, MD Authorized by: Pilar Jarvis, MD   Critical care provider statement:    Critical care time (minutes):  30   Critical care was time spent personally by me on the following activities:  Development of treatment plan with patient or surrogate, discussions with consultants, evaluation of patient's response to  treatment, examination of patient, ordering and review of laboratory studies, ordering and review of radiographic studies, ordering and performing treatments and interventions, pulse oximetry, re-evaluation of patient's condition and review of old charts    MEDICATIONS ORDERED IN ED: Medications  fentaNYL (SUBLIMAZE) injection 50 mcg (has no administration in time range)  heparin ADULT infusion 100 units/mL (25000 units/255mL) (has no administration in time range)    External physician / consultants:  I spoke with Dr. Evie Lacks vascular surgery regarding care plan for this patient.   IMPRESSION / MDM / ASSESSMENT AND PLAN / ED COURSE  I reviewed the triage vital signs and the nursing notes.                                Patient's presentation is most consistent with acute presentation with potential threat to life or bodily function.  Differential diagnosis includes, but is not limited to, critical limb ischemia, considered but less likely trauma, DVT   The patient is on the cardiac  monitor to evaluate for evidence of arrhythmia and/or significant heart rate changes.  MDM:    Cold pulseless discolored leg and a vasculopath and is very suspicious for critical limb ischemia.  Vascular consultation immediately with Dr. Evie Lacks.  Started on heparin.         FINAL CLINICAL IMPRESSION(S) / ED DIAGNOSES   Final diagnoses:  Ischemic leg     Rx / DC Orders   ED Discharge Orders     None        Note:  This document was prepared using Dragon voice recognition software and may include unintentional dictation errors.    Pilar Jarvis, MD 04/23/23 [phone]

## 2023-04-23 NOTE — Consult Note (Signed)
 [facility] Vascular Consult Note  MRN : [id number]  Anthony Hess is a 68 y.o. (05/25/1955) male who presents with chief complaint of  Chief Complaint  Patient presents with   Leg Problem  .  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 ESRD on HD via a right internal jugular Permacath T/TH/Sat. S/P LEFT SFA/Pop Stenting/tibial intervention January 2025; RIGHT SFA/Pop stenting and tibial intervention for limb salvage October 2024. On Eliquis, continues to smoke. Presents with ~1 week of progressive RIGHT lower extremity pain. The patient states the right leg/foot became painful and cool about 1 week ago but also elicits that he dropped something on his foot as the starting factor. Nonetheless, he presented to Urgent care and called our office in the last few days with above complaints. Upon evaluation at urgent care he was advised to present to the ER, he refused and felt it would resolve on its own. He presented early this morning to the ER with complaints of worsening foot and leg pain. Upon my evaluation the patient states his foot/leg are about the same, not worsening from last night with current pain control and heparin gtt. The patient also has a malfunctioning Permacath- scheduled for exchange on Monday by our service. The patient states the catheter works intermittently.  Current Facility-Administered Medications  Medication Dose Route Frequency Provider Last Rate Last Admin   Chlorhexidine Gluconate Cloth 2 % PADS 6 each  6 each Topical Q0600 Kolluru, Sarath, MD       dextrose 50 % solution 12.5 g  12.5 g Intravenous STAT Floydene Flock, MD       fentaNYL (SUBLIMAZE) injection 50 mcg  50 mcg Intravenous Once Pilar Jarvis, MD       heparin ADULT infusion 100 units/mL (25000 units/273mL)  850 Units/hr Intravenous Continuous Pilar Jarvis, MD 8.5 mL/hr at 04/23/23 0534 850 Units/hr at 04/23/23 0534   morphine (PF) 2 MG/ML injection 2 mg  2  mg Intravenous Q2H PRN Andris Baumann, MD   2 mg at 04/23/23 0845   nicotine (NICODERM CQ - dosed in mg/24 hours) patch 14 mg  14 mg Transdermal Daily Floydene Flock, MD       ondansetron [facility]) tablet 4 mg  4 mg Oral Q6H PRN Floydene Flock, MD       Or   ondansetron [facility]) injection 4 mg  4 mg Intravenous Q6H PRN Floydene Flock, MD       Current Outpatient Medications  Medication Sig Dispense Refill   apixaban (ELIQUIS) 2.5 MG TABS tablet Take 1 tablet (2.5 mg total) by mouth 2 (two) times daily. 60 tablet 0   aspirin EC 81 MG tablet Take 81 mg by mouth daily.     atorvastatin (LIPITOR) 20 MG tablet Take 1 tablet (20 mg total) by mouth daily. 30 tablet 11   HYDROcodone-acetaminophen (NORCO/VICODIN) 5-325 MG tablet Take 1 tablet by mouth every 6 (six) hours as needed for severe pain (pain score 7-10). 20 tablet 0   isosorbide dinitrate (ISORDIL) 10 MG tablet Take 1 tablet (10 mg total) by mouth 3 (three) times daily. 90 tablet 0   pantoprazole (PROTONIX) 40 MG tablet Take 1 tablet (40 mg total) by mouth 2 (two) times daily before a meal. 60 tablet 0   sevelamer carbonate (RENVELA) 800 MG tablet Take 1,600 mg by mouth 3 (three) times daily.      Past Medical History:  Diagnosis Date   Aortic  atherosclerosis (HCC)    Bilateral carotid artery disease (HCC)    Bladder cancer (HCC)    Coronary artery disease 12/20/2018   a.) LHC 12/20/2018: 50% OM1, 40% OM2, 95% o-pLAD, 75% o=pLCx, 40% mLM, 70% D1, 60% mRCA-1, 50% mRCA-2; refer to CVTS. b.) 4v CABG at [facility] on 12/27/2018: LIMA-LAD, RIMA-PDA, seg LRA-OM1-D1   DCM (dilated cardiomyopathy) (HCC) 12/05/2018   a.) TTE 12/05/2018: EF 40-45%. b.) TTE 12/28/2019: EF 20-25%.   Dyspnea 10/15/2022   ESRD (end stage renal disease) (HCC)    a.) T-Th-Sat   HFrEF (heart failure with reduced ejection fraction) (HCC) 12/05/2018   a.) TTE 12/05/2018: EF 40-45%; mild LVH; ant/apical/sep HK; mild TR . b.) TTE 12/28/2019: EF 20-25%; mod LVH; mod MR/AR;  G1DD.   History of 2019 novel coronavirus disease (COVID-19) 04/08/2021   History of kidney stones    HLD (hyperlipidemia)    Hx of CABG 12/27/2018   Hypertension    Infrarenal abdominal aortic aneurysm (AAA) without rupture (HCC) 03/05/2021   a.) CT abd/pelvis; measured 3.2 cm.   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   a.) LIMA-LAD, RIMA-PDA, sequential LEFT radial artery to OM1 and D1   Sepsis (HCC) 03/14/2021   Wears glasses     Past Surgical History:  Procedure Laterality Date   AV FISTULA PLACEMENT Left 07/30/2021   Procedure: INSERTION OF ARTERIOVENOUS (AV) GORE-TEX GRAFT ARM BRACHIAL ARTERY TO AXILLARY VEIN;  Surgeon: Annice Needy, MD;  Location: [facility] ORS;  Service: Vascular;  Laterality: Left;   CAPD INSERTION N/A 12/31/2019   Procedure: LAPAROSCOPIC INSERTION CONTINUOUS AMBULATORY PERITONEAL DIALYSIS  (CAPD) CATHETER;  Surgeon: Leafy Ro, MD;  Location: [facility] ORS;  Service: General;  Laterality: N/A;   CAPD REMOVAL N/A 04/10/2020   Procedure: LAPAROSCOPIC REVISION OF CONTINUOUS AMBULATORY PERITONEAL DIALYSIS  (CAPD) CATHETER;  Surgeon: Leafy Ro, MD;  Location: [facility] ORS;  Service: General;  Laterality: N/A;   CORONARY ARTERY BYPASS GRAFT N/A 12/27/2018   Procedure: CORONARY ARTERY BYPASS GRAFTING (CABG) X 4 ON PUMP USING RIGHT & LEFT INTERNAL MAMMARY ARTERY LEFT RADIAL ARTERY ENDOSCOPICALLY HARVESTED;  Surgeon: Linden Dolin, MD;  Location: [facility];  Service: Open Heart Surgery;  Laterality: N/A;   CYSTOSCOPY W/ RETROGRADES Bilateral 05/15/2019   Procedure: CYSTOSCOPY WITH RETROGRADE PYELOGRAM;  Surgeon: Riki Altes, MD;  Location: [facility] ORS;  Service: Urology;  Laterality: Bilateral;   CYSTOSCOPY WITH BIOPSY N/A 05/15/2019   Procedure: CYSTOSCOPY WITH bladder BIOPSY;  Surgeon: Riki Altes, MD;  Location: [facility] ORS;  Service: Urology;  Laterality: N/A;   DIALYSIS/PERMA CATHETER INSERTION N/A 12/28/2019   Procedure: DIALYSIS/PERMA CATHETER INSERTION;  Surgeon: Annice Needy, MD;  Location: [facility] INVASIVE CV LAB;  Service: Cardiovascular;  Laterality: N/A;   DIALYSIS/PERMA CATHETER INSERTION N/A 03/18/2021   Procedure: DIALYSIS/PERMA CATHETER INSERTION;  Surgeon: Annice Needy, MD;  Location: [facility] INV[facility] CV LAB;  Service: Cardiovascular;  Laterality: N/A;   DIALYSIS/PERMA CATHETER REMOVAL N/A 06/02/2020   Procedure: DIALYSIS/PERMA CATHETER REMOVAL;  Surgeon: Annice Needy, MD;  Location: [facility] INVAS[facility] CV LAB;  Service: Cardiovascular;  Laterality: N/A;   DIALYSIS/PERMA CATHETER REPAIR N/A 10/22/2022   Procedure: DIALYSIS/PERMA CATHETER REPAIR;  Surgeon: Renford Dills, MD;  Location: [facility] INVASIVE CV LAB;  Service: Cardiovascular;  Laterality: N/A;   ESOPHAGOGASTRODUODENOSCOPY N/A  01/09/2023   Procedure: ESOPHAGOGASTRODUODENOSCOPY (EGD);  Surgeon: Toledo, Boykin Nearing, MD;  Location: [facility] ENDOSCOPY;  Service: Gastroenterology;  Laterality: N/A;   EXCHANGE OF A DIALYSIS CATHETER Right 04/10/2020   Procedure: EXCHANGE OF A DIALYSIS CATHETER;  Surgeon: Leafy Ro, MD;  Location: [facility];  Service: General;  Laterality: Right;   HEMOSTASIS CONTROL  01/09/2023   Procedure: HEMOSTASIS CONTROL;  Surgeon: Norma Fredrickson, Boykin Nearing, MD;  Location: [facility];  Service: Gastroenterology;;   [name] HERNIA REPAIR  01/20/2021   Procedure: HERNIA REPAIR INCISIONAL;  Surgeon: Henrene Dodge, MD;  Location: [facility] OR[facility];  Service: General;;   IR IMAGE GUIDED DRAINAGE PERCUT CATH  PERITONEAL RETROPERIT  04/07/2020   LAPAROSCOPY N/A 04/16/2021   Procedure: LAPAROSCOPY DIAGNOSTIC;  Surgeon: Sung Amabile, DO;  Location: [facility];  Service: General;  Laterality: N/A;   LAPAROTOMY N/A 04/26/2022   Procedure: EXPLORATORY LAPAROTOMY WITH REPAIR OF DUODENAL ULCER;  Surgeon: Carolan Shiver, MD;   Location: [facility] OR[facility];  Service: General;  Laterality: N/A;   LEFT HEART CATH AND CORONARY ANGIOGRAPHY Left 12/20/2018   Procedure: LEFT HEART CATH AND CORONARY ANGIOGRAPHY;  Surgeon: Marcina Millard, MD;  Location: [facility];  Service: Cardiovascular;  Laterality: Left;   LEFT HEART CATH AND CORONARY ANGIOGRAPHY N/A 01/12/2023   Procedure: LEFT HEART CATH AND CORONARY ANGIOGRAPHY;  Surgeon: Marcina Millard, MD;  Location: [facility];  Service: Cardiovascular;  Laterality: N/A;   LOWER EXTREMITY ANGIOGRAPHY Right 12/23/2022   Procedure: Lower Extremity Angiography;  Surgeon: Annice Needy, MD;  Location: [facility];  Service: Cardiovascular;  Laterality: Right;   LOWER EXTREMITY ANGIOGRAPHY Left 03/07/2023   Procedure: Lower Extremity Angiography;  Surgeon: Annice Needy, MD;  Location: [facility];  Service: Cardiovascular;  Laterality: Left;   RADIAL ARTERY HARVEST Left 12/27/2018   Procedure: ENDOSCOPIC RADIAL ARTERY HARVEST;  Surgeon: Linden Dolin, MD;  Location: [facility];  Service: Open Heart Surgery;  Laterality: Left;   REMOVAL OF A DIALYSIS CATHETER Left 03/20/2021   Procedure: REMOVAL OF A PD CATHETER;  Surgeon: Annice Needy, MD;  Location: [facility];  Service: Vascular;  Laterality: Left;   REVISION OF ARTERIOVENOUS GORETEX GRAFT Left 09/11/2021   Procedure: Excisionof infected AV graft;  Surgeon: Renford Dills, MD;  Location: [facility];  Service: Vascular;  Laterality: Left;   SUBMUCOSAL INJECTION  01/09/2023   Procedure: SUBMUCOSAL INJECTION;  Surgeon: Toledo, Boykin Nearing, MD;  Location: [facility] ENDOSCOPY;  Service: Gastroenterology;;   TEE WITHOUT CARDIOVERSION N/A 12/27/2018   Procedure: TRANSESOPHAGEAL ECHOCARDIOGRAM (TEE);  Surgeon: Linden Dolin, MD;  Location: [facility] OR;  Service: Open Heart Surgery;  Laterality: N/A;   TUMOR REMOVAL  2019   Bladder    Social History Social History   Tobacco Use   Smoking status: Former    Types:  Cigarettes    Start date: 12/2022    Quit date: 02/19/2019    Years since quitting: 4.1   Smokeless tobacco: Never  Vaping Use   Vaping status: Never Used  Substance Use Topics   Alcohol use: Never   Drug use: Never    Family History Family History  Problem Relation Age of Onset   Heart failure Mother     No Known Allergies   REVIEW OF SYSTEMS (Negative unless checked)  Constitutional: [] Weight loss  [] Fever  [] Chills Cardiac: [] Chest pain   [] Chest pressure   [] Palpitations   [] Shortness of breath when laying flat   [] Shortness of  breath at rest   [] Shortness of breath with exertion. Vascular:  [x] Pain in legs with walking   [x] Pain in legs at rest   [] Pain in legs when laying flat   [] Claudication   [] Pain in feet when walking  [] Pain in feet at rest  [] Pain in feet when laying flat   [] History of DVT   [] Phlebitis   [] Swelling in legs   [] Varicose veins   [] Non-healing ulcers Pulmonary:   [] Uses home oxygen   [] Productive cough   [] Hemoptysis   [] Wheeze  [] COPD   [] Asthma Neurologic:  [] Dizziness  [] Blackouts   [] Seizures   [] History of stroke   [] History of TIA  [] Aphasia   [] Temporary blindness   [] Dysphagia   [] Weakness or numbness in arms   [] Weakness or numbness in legs Musculoskeletal:  [] Arthritis   [] Joint swelling   [] Joint pain   [] Low back pain Hematologic:  [] Easy bruising  [] Easy bleeding   [] Hypercoagulable state   [] Anemic  [] Hepatitis Gastrointestinal:  [] Blood in stool   [] Vomiting blood  [] Gastroesophageal reflux/heartburn   [] Difficulty swallowing. Genitourinary:  [x] Chronic kidney disease   [] Difficult urination  [] Frequent urination  [] Burning with urination   [] Blood in urine Skin:  [] Rashes   [] Ulcers   [] Wounds Psychological:  [] History of anxiety   []  History of major depression.  Physical Examination  Vitals:   04/23/23 0540 04/23/23 0600 04/23/23 0630 04/23/23 0800  BP: (!) 155/105 (!) 166/145 (!) 162/73 137/86  Pulse: 79  72 78  Resp:   18 15   Temp:      TempSrc:      SpO2: 94%  91% 90%  Weight:      Height:       Body mass index is 21.37 kg/m. Gen:  WD/WN, NAD Head: [place]/AT, No temporalis wasting. Prominent temp pulse not noted. Pulmonary:  Good air movement, respirations not labored, equal bilaterally. RIGHT internal jugular Permacath in place Cardiac: RRR, normal S1, S2. Vascular:  Vessel Right Left  Radial Palpable Palpable  Ulnar    Brachial    Carotid    Aorta Not palpable N/A  Femoral Palpable Palpable  Popliteal    PT    DP Not palpable Not Palpable   Gastrointestinal: soft, non-tender/non-distended. No guarding/reflex.  Musculoskeletal: M/S 5/5 throughout.  RIGHT lower extremity- warm to midfoot, distal leg and foot with some discoloration; toes slightly cool, +motor/+sensory intact Neurologic: Sensation grossly intact in extremities.  Symmetrical.  Speech is fluent. Motor exam as listed above. Psychiatric: Judgment intact, Mood & affect appropriate for pt's clinical situation.      CBC Lab Results  Component Value Date   WBC 14.3 (H) 04/23/2023   HGB 13.6 04/23/2023   HCT 46.8 04/23/2023   MCV 91.2 04/23/2023   PLT 272 04/23/2023    BMET    Component Value Date/Time   NA 139 04/23/2023 0420   NA 141 06/25/2022 1414   K 5.1 04/23/2023 0420   CL 97 (L) 04/23/2023 0420   CO2 15 (L) 04/23/2023 0420   GLUCOSE 53 (L) 04/23/2023 0420   BUN 49 (H) 04/23/2023 0420   BUN 39 (H) 06/25/2022 1414   CREATININE 7.28 (H) 04/23/2023 0420   CREATININE 4.62 (H) 01/10/2019 1351   CALCIUM 7.9 (L) 04/23/2023 0420   GFRNONAA 8 (L) 04/23/2023 0420   GFRAA 15 (L) 01/04/2020 1542   Estimated Creatinine Clearance: 7.6 mL/min (A) (by C-G formula based on SCr of 7.28 mg/dL (H)).  COAG Lab Results  Component Value Date   INR 1.1 04/23/2023   INR 1.2 01/08/2023   INR 1.3 (H) 12/21/2022    Radiology No results found.    Assessment/Plan 1. Acute on Chronic critical RIGHT lower extremity ischemia.      Heparin gtt; pain control; plan for intervention tomorrow morning per Dr. Wyn Quaker 2. Malfunctioning RIGHT internal jugular dialysis catheter    Patient was scheduled for Monday for exchange- will exchange tomorrow also; OK for HD today per Nephrology. NPO after MN Consent on chart    Bertram Denver, MD  04/23/2023 10:02 AM    This note was created with Dragon medical transcription system.  Any error is purely unintentional

## 2023-04-23 NOTE — Assessment & Plan Note (Signed)
 Cont statin

## 2023-04-23 NOTE — Progress Notes (Addendum)
 PHARMACY - ANTICOAGULATION CONSULT NOTE  Pharmacy Consult for Heparin  Indication: limb ischemia   No Known Allergies  Patient Measurements: Height: 5\' 2"  (157.5 cm) Weight: 53 kg (116 lb 13.5 oz) IBW/kg (Calculated) : 54.6 Heparin Dosing Weight: 53 kg   Vital Signs: Temp: 98.2 F (36.8 C) (03/01 0412) Temp Source: Oral (03/01 0412) BP: 137/106 (03/01 0412) Pulse Rate: 112 (03/01 0412)  Labs: Recent Labs    04/23/23 0420  HGB 13.6  HCT 46.8  PLT 272    CrCl cannot be calculated (Patient's most recent lab result is older than the maximum 21 days allowed.).   Medical History: Past Medical History:  Diagnosis Date   Aortic atherosclerosis (HCC)    Bilateral carotid artery disease (HCC)    Bladder cancer (HCC)    Coronary artery disease 12/20/2018   a.) LHC 12/20/2018: 50% OM1, 40% OM2, 95% o-pLAD, 75% o=pLCx, 40% mLM, 70% D1, 60% mRCA-1, 50% mRCA-2; refer to CVTS. b.) 4v CABG at [facility] on 12/27/2018: LIMA-LAD, RIMA-PDA, seg LRA-OM1-D1   DCM (dilated cardiomyopathy) (HCC) 12/05/2018   a.) TTE 12/05/2018: EF 40-45%. b.) TTE 12/28/2019: EF 20-25%.   Dyspnea 10/15/2022   ESRD (end stage renal disease) (HCC)    a.) T-Th-Sat   HFrEF (heart failure with reduced ejection fraction) (HCC) 12/05/2018   a.) TTE 12/05/2018: EF 40-45%; mild LVH; ant/apical/sep HK; mild TR . b.) TTE 12/28/2019: EF 20-25%; mod LVH; mod MR/AR; G1DD.   History of 2019 novel coronavirus disease (COVID-19) 04/08/2021   History of kidney stones    HLD (hyperlipidemia)    Hx of CABG 12/27/2018   Hypertension    Infrarenal abdominal aortic aneurysm (AAA) without rupture (HCC) 03/05/2021   a.) CT abd/pelvis; measured 3.2 cm.   Melena 05/04/2022   Myocardial infarction [facility])    NSTEMI (non-ST elevated myocardial infarction) (HCC) 04/19/2022   Perforation bowel (HCC) 04/26/2022   PVD (peripheral vascular disease) (HCC)    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   a.) LIMA-LAD, RIMA-PDA, sequential LEFT radial artery to  OM1 and D1   Sepsis (HCC) 03/14/2021   Wears glasses     Medications:  (Not in a hospital admission)   Assessment: Pharmacy consulted to dose heparin in this 68 year old male admitted with limb ischemia.  Pt was on Eliquis 5 mg PO BID PTA, last dose on 2/27 at unknown time.  CrCl = 8 ml/min  Goal of Therapy:  Heparin level 0.3-0.7 units/ml aPTT 66 - 102  seconds Monitor platelets by anticoagulation protocol: Yes   Plan:  Give 3200 units bolus x 1 Start heparin infusion at 850 units/hr - Will use aPTT to guide dosing unitil correlating with HL - Will check aPTT 8 hrs after start of drip - CBC daily  Latondra Gebhart D 04/23/2023,5:04 AM

## 2023-04-24 ENCOUNTER — Encounter
Admission: EM | Disposition: E | Payer: Self-pay | Source: Ambulatory Visit | Attending: Student in an Organized Health Care Education/Training Program

## 2023-04-24 DIAGNOSIS — I4901 Ventricular fibrillation: Secondary | ICD-10-CM | POA: Diagnosis not present

## 2023-04-24 DIAGNOSIS — T8249XA Other complication of vascular dialysis catheter, initial encounter: Secondary | ICD-10-CM

## 2023-04-24 DIAGNOSIS — I771 Stricture of artery: Secondary | ICD-10-CM

## 2023-04-24 DIAGNOSIS — I482 Chronic atrial fibrillation, unspecified: Secondary | ICD-10-CM | POA: Diagnosis present

## 2023-04-24 DIAGNOSIS — I998 Other disorder of circulatory system: Secondary | ICD-10-CM

## 2023-04-24 DIAGNOSIS — I70221 Atherosclerosis of native arteries of extremities with rest pain, right leg: Secondary | ICD-10-CM | POA: Diagnosis present

## 2023-04-24 DIAGNOSIS — E782 Mixed hyperlipidemia: Secondary | ICD-10-CM | POA: Diagnosis present

## 2023-04-24 DIAGNOSIS — E872 Acidosis, unspecified: Secondary | ICD-10-CM | POA: Diagnosis not present

## 2023-04-24 DIAGNOSIS — Y712 Prosthetic and other implants, materials and accessory cardiovascular devices associated with adverse incidents: Secondary | ICD-10-CM | POA: Diagnosis not present

## 2023-04-24 DIAGNOSIS — I70202 Unspecified atherosclerosis of native arteries of extremities, left leg: Secondary | ICD-10-CM | POA: Diagnosis not present

## 2023-04-24 DIAGNOSIS — Z8616 Personal history of COVID-19: Secondary | ICD-10-CM | POA: Diagnosis not present

## 2023-04-24 DIAGNOSIS — E875 Hyperkalemia: Secondary | ICD-10-CM | POA: Diagnosis not present

## 2023-04-24 DIAGNOSIS — Z9889 Other specified postprocedural states: Secondary | ICD-10-CM | POA: Diagnosis not present

## 2023-04-24 DIAGNOSIS — Z91199 Patient's noncompliance with other medical treatment and regimen due to unspecified reason: Secondary | ICD-10-CM | POA: Diagnosis not present

## 2023-04-24 DIAGNOSIS — I132 Hypertensive heart and chronic kidney disease with heart failure and with stage 5 chronic kidney disease, or end stage renal disease: Secondary | ICD-10-CM | POA: Diagnosis present

## 2023-04-24 DIAGNOSIS — T8241XA Breakdown (mechanical) of vascular dialysis catheter, initial encounter: Secondary | ICD-10-CM | POA: Diagnosis present

## 2023-04-24 DIAGNOSIS — I959 Hypotension, unspecified: Secondary | ICD-10-CM | POA: Diagnosis not present

## 2023-04-24 DIAGNOSIS — I420 Dilated cardiomyopathy: Secondary | ICD-10-CM | POA: Diagnosis present

## 2023-04-24 DIAGNOSIS — N2581 Secondary hyperparathyroidism of renal origin: Secondary | ICD-10-CM | POA: Diagnosis present

## 2023-04-24 DIAGNOSIS — I743 Embolism and thrombosis of arteries of the lower extremities: Secondary | ICD-10-CM | POA: Diagnosis not present

## 2023-04-24 DIAGNOSIS — I213 ST elevation (STEMI) myocardial infarction of unspecified site: Secondary | ICD-10-CM | POA: Diagnosis not present

## 2023-04-24 DIAGNOSIS — D631 Anemia in chronic kidney disease: Secondary | ICD-10-CM | POA: Diagnosis present

## 2023-04-24 DIAGNOSIS — F17210 Nicotine dependence, cigarettes, uncomplicated: Secondary | ICD-10-CM | POA: Diagnosis present

## 2023-04-24 DIAGNOSIS — R636 Underweight: Secondary | ICD-10-CM | POA: Diagnosis present

## 2023-04-24 DIAGNOSIS — T82868A Thrombosis of vascular prosthetic devices, implants and grafts, initial encounter: Principal | ICD-10-CM

## 2023-04-24 DIAGNOSIS — R64 Cachexia: Secondary | ICD-10-CM | POA: Diagnosis present

## 2023-04-24 DIAGNOSIS — I5042 Chronic combined systolic (congestive) and diastolic (congestive) heart failure: Secondary | ICD-10-CM | POA: Diagnosis present

## 2023-04-24 DIAGNOSIS — Z992 Dependence on renal dialysis: Secondary | ICD-10-CM | POA: Diagnosis not present

## 2023-04-24 DIAGNOSIS — I70223 Atherosclerosis of native arteries of extremities with rest pain, bilateral legs: Secondary | ICD-10-CM | POA: Diagnosis not present

## 2023-04-24 DIAGNOSIS — J9690 Respiratory failure, unspecified, unspecified whether with hypoxia or hypercapnia: Secondary | ICD-10-CM | POA: Diagnosis not present

## 2023-04-24 DIAGNOSIS — J449 Chronic obstructive pulmonary disease, unspecified: Secondary | ICD-10-CM | POA: Diagnosis present

## 2023-04-24 DIAGNOSIS — I700 Atherosclerosis of aorta: Secondary | ICD-10-CM

## 2023-04-24 DIAGNOSIS — N186 End stage renal disease: Secondary | ICD-10-CM | POA: Diagnosis present

## 2023-04-24 DIAGNOSIS — I462 Cardiac arrest due to underlying cardiac condition: Secondary | ICD-10-CM | POA: Diagnosis not present

## 2023-04-24 HISTORY — PX: LOWER EXTREMITY ANGIOGRAPHY: CATH118251

## 2023-04-24 LAB — CBC
HCT: 44.8 % (ref 39.0–52.0)
HCT: 44.2 % (ref 39.0–52.0)
HCT: 41.9 % (ref 39.0–52.0)
HCT: 41.6 % (ref 39.0–52.0)
Hemoglobin: 13.6 g/dL (ref 13.0–17.0)
Hemoglobin: 13.3 g/dL (ref 13.0–17.0)
Hemoglobin: 12.7 g/dL — ABNORMAL LOW (ref 13.0–17.0)
Hemoglobin: 12.6 g/dL — ABNORMAL LOW (ref 13.0–17.0)
MCH: 27 pg (ref 26.0–34.0)
MCH: 26.8 pg (ref 26.0–34.0)
MCH: 26.5 pg (ref 26.0–34.0)
MCH: 26.5 pg (ref 26.0–34.0)
MCHC: 30.4 g/dL (ref 30.0–36.0)
MCHC: 30.1 g/dL (ref 30.0–36.0)
MCHC: 30.3 g/dL (ref 30.0–36.0)
MCHC: 30.3 g/dL (ref 30.0–36.0)
MCV: 88.9 fL (ref 80.0–100.0)
MCV: 89.1 fL (ref 80.0–100.0)
MCV: 87.5 fL (ref 80.0–100.0)
MCV: 87.6 fL (ref 80.0–100.0)
Platelets: 279 10*3/uL (ref 150–400)
Platelets: 286 10*3/uL (ref 150–400)
Platelets: 281 10*3/uL (ref 150–400)
Platelets: 262 10*3/uL (ref 150–400)
RBC: 5.04 MIL/uL (ref 4.22–5.81)
RBC: 4.96 MIL/uL (ref 4.22–5.81)
RBC: 4.79 MIL/uL (ref 4.22–5.81)
RBC: 4.75 MIL/uL (ref 4.22–5.81)
RDW: 21.4 % — ABNORMAL HIGH (ref 11.5–15.5)
RDW: 21.1 % — ABNORMAL HIGH (ref 11.5–15.5)
RDW: 20.8 % — ABNORMAL HIGH (ref 11.5–15.5)
RDW: 20.9 % — ABNORMAL HIGH (ref 11.5–15.5)
WBC: 12.1 10*3/uL — ABNORMAL HIGH (ref 4.0–10.5)
WBC: 11.8 10*3/uL — ABNORMAL HIGH (ref 4.0–10.5)
WBC: 12 10*3/uL — ABNORMAL HIGH (ref 4.0–10.5)
WBC: 13.8 10*3/uL — ABNORMAL HIGH (ref 4.0–10.5)
nRBC: 0 % (ref 0.0–0.2)
nRBC: 0 % (ref 0.0–0.2)
nRBC: 0 % (ref 0.0–0.2)
nRBC: 0 % (ref 0.0–0.2)

## 2023-04-24 LAB — COMPREHENSIVE METABOLIC PANEL
ALT: 21 U/L (ref 0–44)
AST: 40 U/L (ref 15–41)
Albumin: 3.7 g/dL (ref 3.5–5.0)
Alkaline Phosphatase: 66 U/L (ref 38–126)
Anion gap: 18 — ABNORMAL HIGH (ref 5–15)
BUN: 45 mg/dL — ABNORMAL HIGH (ref 8–23)
CO2: 23 mmol/L (ref 22–32)
Calcium: 8.3 mg/dL — ABNORMAL LOW (ref 8.9–10.3)
Chloride: 95 mmol/L — ABNORMAL LOW (ref 98–111)
Creatinine, Ser: 7.15 mg/dL — ABNORMAL HIGH (ref 0.61–1.24)
GFR, Estimated: 8 mL/min — ABNORMAL LOW (ref >60)
Glucose, Bld: 99 mg/dL (ref 70–99)
Potassium: 5.1 mmol/L (ref 3.5–5.1)
Sodium: 136 mmol/L (ref 135–145)
Total Bilirubin: 0.4 mg/dL (ref 0.0–1.2)
Total Protein: 8.1 g/dL (ref 6.5–8.1)

## 2023-04-24 LAB — GLUCOSE, CAPILLARY
Glucose-Capillary: 114 mg/dL — ABNORMAL HIGH (ref 70–99)
Glucose-Capillary: 118 mg/dL — ABNORMAL HIGH (ref 70–99)
Glucose-Capillary: 130 mg/dL — ABNORMAL HIGH (ref 70–99)
Glucose-Capillary: 152 mg/dL — ABNORMAL HIGH (ref 70–99)
Glucose-Capillary: 75 mg/dL (ref 70–99)
Glucose-Capillary: 97 mg/dL (ref 70–99)

## 2023-04-24 LAB — HEPARIN LEVEL (UNFRACTIONATED)
Heparin Unfractionated: >1.1 [IU]/mL — ABNORMAL HIGH (ref 0.30–0.70)
Heparin Unfractionated: 0.12 [IU]/mL — ABNORMAL LOW (ref 0.30–0.70)
Heparin Unfractionated: <0.1 [IU]/mL — ABNORMAL LOW (ref 0.30–0.70)

## 2023-04-24 LAB — MRSA NEXT GEN BY PCR, NASAL: MRSA by PCR Next Gen: NOT DETECTED

## 2023-04-24 LAB — FIBRINOGEN
Fibrinogen: 701 mg/dL — ABNORMAL HIGH (ref 210–475)
Fibrinogen: 648 mg/dL — ABNORMAL HIGH (ref 210–475)
Fibrinogen: 596 mg/dL — ABNORMAL HIGH (ref 210–475)

## 2023-04-24 LAB — APTT: aPTT: 45 s — ABNORMAL HIGH (ref 24–36)

## 2023-04-24 LAB — HIV ANTIBODY (ROUTINE TESTING W REFLEX): HIV Screen 4th Generation wRfx: NONREACTIVE

## 2023-04-24 SURGERY — LOWER EXTREMITY ANGIOGRAPHY
Anesthesia: Moderate Sedation | Laterality: Right

## 2023-04-24 MED ORDER — SODIUM CHLORIDE 0.9 % IV SOLN
0.5000 mg/h | INTRAVENOUS | Status: DC
  Administered 2023-04-24 (×2): 0.5 mg/h
  Filled 2023-04-24 (×2): qty 10

## 2023-04-24 MED ORDER — SODIUM CHLORIDE 0.9% FLUSH
3.0000 mL | Freq: Two times a day (BID) | INTRAVENOUS | Status: DC
  Administered 2023-04-24 – 2023-04-25 (×4): 3 mL via INTRAVENOUS

## 2023-04-24 MED ORDER — HEPARIN (PORCINE) 25000 UT/250ML-% IV SOLN: 600.0000 [IU]/h | INTRAVENOUS | Status: DC

## 2023-04-24 MED ORDER — HYDROMORPHONE HCL 1 MG/ML IJ SOLN
INTRAMUSCULAR | Status: AC
  Filled 2023-04-24: qty 1

## 2023-04-24 MED ORDER — MIDAZOLAM HCL 5 MG/5ML IJ SOLN
INTRAMUSCULAR | Status: AC
  Filled 2023-04-24: qty 5

## 2023-04-24 MED ORDER — ALTEPLASE 2 MG IJ SOLR
INTRAMUSCULAR | Status: AC
  Filled 2023-04-24: qty 8

## 2023-04-24 MED ORDER — HEPARIN BOLUS VIA INFUSION
1600.0000 [IU] | Freq: Once | INTRAVENOUS | Status: AC
  Administered 2023-04-24: 1600 [IU] via INTRAVENOUS
  Filled 2023-04-24: qty 1600

## 2023-04-24 MED ORDER — FENTANYL CITRATE (PF) 100 MCG/2ML IJ SOLN
INTRAMUSCULAR | Status: AC
  Filled 2023-04-24: qty 2

## 2023-04-24 MED ORDER — PANTOPRAZOLE SODIUM 40 MG PO TBEC
40.0000 mg | DELAYED_RELEASE_TABLET | Freq: Two times a day (BID) | ORAL | Status: DC
  Administered 2023-04-24 – 2023-04-25 (×2): 40 mg via ORAL
  Filled 2023-04-24 (×2): qty 1

## 2023-04-24 MED ORDER — ESMOLOL HCL-SODIUM CHLORIDE 2000 MG/100ML IV SOLN
25.0000 ug/kg/min | INTRAVENOUS | Status: DC
  Filled 2023-04-24: qty 100

## 2023-04-24 MED ORDER — LIDOCAINE-EPINEPHRINE (PF) 1 %-1:200000 IJ SOLN
INTRAMUSCULAR | Status: DC | PRN
  Administered 2023-04-24: 30 mL via INTRADERMAL

## 2023-04-24 MED ORDER — HEPARIN SODIUM (PORCINE) 1000 UNIT/ML IJ SOLN
INTRAMUSCULAR | Status: AC
  Filled 2023-04-24: qty 10

## 2023-04-24 MED ORDER — MIDAZOLAM HCL 2 MG/2ML IJ SOLN
INTRAMUSCULAR | Status: DC | PRN
  Administered 2023-04-24: 2 mg via INTRAVENOUS

## 2023-04-24 MED ORDER — LABETALOL HCL 5 MG/ML IV SOLN
INTRAVENOUS | Status: DC | PRN
  Administered 2023-04-24: 10 mg via INTRAVENOUS

## 2023-04-24 MED ORDER — HEPARIN (PORCINE) IN NACL 2000-0.9 UNIT/L-% IV SOLN
INTRAVENOUS | Status: DC | PRN
  Administered 2023-04-24: 1000 mL

## 2023-04-24 MED ORDER — HYDRALAZINE HCL 20 MG/ML IJ SOLN: 10.0000 mg | INTRAMUSCULAR | Status: DC | PRN

## 2023-04-24 MED ORDER — ALTEPLASE 2 MG IJ SOLR
INTRAMUSCULAR | Status: DC | PRN
  Administered 2023-04-24: 8 mg

## 2023-04-24 MED ORDER — HEPARIN SODIUM (PORCINE) 1000 UNIT/ML IJ SOLN
INTRAMUSCULAR | Status: DC | PRN
  Administered 2023-04-24: 5000 [IU] via INTRAVENOUS

## 2023-04-24 MED ORDER — OXYCODONE HCL 5 MG PO TABS
5.0000 mg | ORAL_TABLET | Freq: Four times a day (QID) | ORAL | Status: DC | PRN
  Administered 2023-04-24 – 2023-04-25 (×3): 5 mg via ORAL
  Filled 2023-04-24 (×3): qty 1

## 2023-04-24 MED ORDER — HYDROMORPHONE HCL 1 MG/ML IJ SOLN
1.0000 mg | INTRAMUSCULAR | Status: DC | PRN
  Administered 2023-04-24 – 2023-04-25 (×9): 1 mg via INTRAVENOUS
  Filled 2023-04-24 (×8): qty 1

## 2023-04-24 MED ORDER — FENTANYL CITRATE (PF) 100 MCG/2ML IJ SOLN
INTRAMUSCULAR | Status: DC | PRN
  Administered 2023-04-24: 50 ug via INTRAVENOUS

## 2023-04-24 MED ORDER — ATORVASTATIN CALCIUM 20 MG PO TABS
20.0000 mg | ORAL_TABLET | Freq: Every day | ORAL | Status: DC
  Administered 2023-04-24 – 2023-04-25 (×2): 20 mg via ORAL
  Filled 2023-04-24 (×2): qty 1

## 2023-04-24 MED ORDER — HEPARIN SODIUM (PORCINE) 10000 UNIT/ML IJ SOLN
INTRAMUSCULAR | Status: AC
  Filled 2023-04-24: qty 1

## 2023-04-24 MED ORDER — SODIUM CHLORIDE 0.9% FLUSH: 3.0000 mL | INTRAVENOUS | Status: DC | PRN

## 2023-04-24 MED ORDER — IODIXANOL 320 MG/ML IV SOLN
INTRAVENOUS | Status: DC | PRN
  Administered 2023-04-24: 45 mL via INTRA_ARTERIAL

## 2023-04-24 MED ORDER — HEPARIN (PORCINE) 25000 UT/250ML-% IV SOLN
600.0000 [IU]/h | INTRAVENOUS | Status: DC
  Administered 2023-04-24: 600 [IU]/h via INTRAVENOUS
  Filled 2023-04-24: qty 250

## 2023-04-24 MED ORDER — SODIUM CHLORIDE 0.9 % IV SOLN
250.0000 mL | INTRAVENOUS | Status: AC | PRN
  Administered 2023-04-24: 250 mL via INTRAVENOUS

## 2023-04-24 MED ORDER — LABETALOL HCL 5 MG/ML IV SOLN
INTRAVENOUS | Status: AC
  Filled 2023-04-24: qty 4

## 2023-04-24 MED ORDER — SODIUM CHLORIDE 0.9 % IV SOLN
1.0000 mg/h | Freq: Once | INTRAVENOUS | Status: AC
  Administered 2023-04-24: 1 mg/h via INTRAVENOUS
  Filled 2023-04-24: qty 24

## 2023-04-24 SURGICAL SUPPLY — 20 items
CANISTER PENUMBRA ENGINE (MISCELLANEOUS) IMPLANT
CATH ANGIO 5F PIGTAIL 65CM (CATHETERS) IMPLANT
CATH BEACON 5 .038 100 VERT TP (CATHETERS) IMPLANT
CATH INDIGO CAT6 KIT (CATHETERS) IMPLANT
CATH INFUS 135X50 (CATHETERS) IMPLANT
CATH PALINDROME-P 19CM W/VT (CATHETERS) IMPLANT
COVER PROBE ULTRASOUND 5X96 (MISCELLANEOUS) IMPLANT
DEVICE PRESTO INFLATION (MISCELLANEOUS) IMPLANT
DEVICE STARCLOSE SE CLOSURE (Vascular Products) IMPLANT
GLIDEWIRE ADV .035X260CM (WIRE) IMPLANT
PACK ANGIOGRAPHY (CUSTOM PROCEDURE TRAY) ×1 IMPLANT
SHEATH BRITE TIP 5FRX11 (SHEATH) IMPLANT
SHEATH PINNACLE MP 6F 45CM (SHEATH) IMPLANT
SUT MNCRL AB 4-0 PS2 18 (SUTURE) IMPLANT
SUT PROLENE 0 CT 1 30 (SUTURE) IMPLANT
SUT SILK 0 FSL (SUTURE) IMPLANT
SYR MEDRAD MARK 7 150ML (SYRINGE) IMPLANT
TUBING CONTRAST HIGH PRESS 72 (TUBING) IMPLANT
WIRE G V18X300CM (WIRE) IMPLANT
WIRE J 3MM .035X145CM (WIRE) IMPLANT

## 2023-04-24 NOTE — Progress Notes (Signed)
 PROGRESS NOTE  Anthony Hess    DOB: 15-Nov-1955, 68 y.o.  ZOX:[id number]    Code Status: Full Code   DOA: 04/23/2023   LOS: 0   Brief hospital course  Anthony Hess is a 68 y.o. male with medical history significant of hypertension, atrial fibrillation on Eliquis, ESRD on hemodialysis TTS through right tunneled IJ catheter, COPD, peripheral vascular disease presenting with critical limb ischemia.  Patient noted had multiple admissions with similar issues in the past including admission October 2024 for limb ischemia status post angiogram angioplasty and stent placement.  Also with Recent thrombectomy and stent placement w/ vascular surgery 03/07/2023.   On Eliquis.  Reports compliance with medication.  Smoking 1/2 pack/day.    Presented to the ER afebrile, hemodynamically stable.  Satting well on room air.  White count 14.3, hemoglobin 13.6, platelets 272, lactate 1.8, creatinine 7.3. RLE with overt skin mottling and discoloration.   They were initially treated with heparin infusion. Vascular surgery was consulted.   Patient was admitted to medicine service for further workup and management of critical limb ischemia as outlined in detail below.  04/24/23 -stable s/p angiogram  Assessment & Plan  Principal Problem:   Critical limb ischemia of both lower extremities (HCC) Active Problems:   Limb ischemia   Critical limb ischemia of right lower extremity (HCC)   (HFpEF) heart failure with preserved ejection fraction (HCC)   COPD (chronic obstructive pulmonary disease) (HCC)   Essential hypertension   Atrial fibrillation, chronic (HCC)   Tobacco dependence   GERD without esophagitis   End stage renal disease (HCC)   Hyperlipidemia, mixed  Critical limb ischemia of right lower extremity (HCC) PAD w/ claudication  Noted eval for limb ischemia status post angiogram with angioplasty and stent placement 12/23/2022  Recent thrombectomy and stent placement w/ vascular surgery 03/07/2023  Still  smoking 1/2 PPD- discussed contraindication and cessation  Continue heparin gtt Vascular surgery consulted - s/p angiogram today Follow up recommendations  On esmolol and alteplase infusion  End stage renal disease (HCC) On HD TTS  Nephrology following   COPD - on 2Lnc and stable - wean O2 as tolerated - continue resp traetments    (HFpEF) heart failure with preserved ejection fraction (HCC) 2D ECHO 12/2022 w/ EF 55%  Monitor volume status  Cont home regimen    Essential hypertension Cont home regimen    Tobacco dependence 1/2 PPD smoker  Discussed cessation at length  Nicotine patch     Atrial fibrillation, chronic (HCC) Rate controlled at present  Transitioned from eliquis to heparin gtt in setting of limb ischemia  Monitor   GERD without esophagitis PPI   HLD  CAD  PAD-  Baseline hx/o CAD s/p CABG  No active CP  Cont home regimen including asa and statin   Body mass index is 22.34 kg/m.  VTE ppx: heparin gtt   Diet:     Diet   Diet Carb Modified Fluid consistency: Thin; Room service appropriate? Yes   Consultants: Nephrology  Vascular surgery   Subjective 04/24/23    Anthony Hess reports pain in his leg which is not improved since procedure. Seems surprised when I tell him the harm smoking poses to his circulation. Denies SOB, CP   Objective   Vitals:   04/24/23 1300 04/24/23 1400 04/24/23 1600 04/24/23 1604  BP: (!) 137/102 114/89  (!) 118/90  Pulse: 81 69  73  Resp: 15 16  18   Temp:   98.4 F (36.9 C)  TempSrc:   Axillary   SpO2: 90% 95%  91%  Weight:      Height:        Intake/Output Summary (Last 24 hours) at 04/24/2023 1706 Last data filed at 04/24/2023 1608 Gross per 24 hour  Intake 515.97 ml  Output --  Net 515.97 ml   Filed Weights   04/23/23 0413 04/23/23 1032 04/23/23 1312  Weight: 53 kg 55.9 kg 55.4 kg     Physical Exam:  General: awake, alert, NAD HEENT: atraumatic, clear conjunctiva, anicteric sclera, MMM, hearing grossly  normal Respiratory: normal respiratory effort. Cardiovascular: quick capillary refill, normal S1/S2, RRR, no JVD, murmurs Gastrointestinal: soft, NT, ND Nervous: A&O x3. no gross focal neurologic deficits, normal speech Extremities: RLE mottled. Tender to palpation and cold. Decreased cap refill Psychiatry: normal mood, congruent affect  Labs   I have personally reviewed the following labs and imaging studies CBC    Component Value Date/Time   WBC 12.0 (H) 04/24/2023 1626   RBC 4.79 04/24/2023 1626   HGB 12.7 (L) 04/24/2023 1626   HGB 9.1 (L) 01/04/2020 1540   HCT 41.9 04/24/2023 1626   HCT 29.6 (L) 01/04/2020 1540   PLT 281 04/24/2023 1626   PLT 191 01/04/2020 1540   MCV 87.5 04/24/2023 1626   MCV 94 01/04/2020 1540   MCH 26.5 04/24/2023 1626   MCHC 30.3 04/24/2023 1626   RDW 20.8 (H) 04/24/2023 1626   RDW 16.2 (H) 01/04/2020 1540   LYMPHSABS 1.2 04/23/2023 1415   LYMPHSABS 1.0 01/04/2020 1540   MONOABS 1.8 (H) 04/23/2023 1415   EOSABS 0.1 04/23/2023 1415   EOSABS 0.5 (H) 03/27/2018 1643   BASOSABS 0.1 04/23/2023 1415   BASOSABS 0.0 03/27/2018 1643      Latest Ref Rng & Units 04/24/2023    5:44 AM 04/23/2023    2:15 PM 04/23/2023    4:20 AM  BMP  Glucose 70 - 99 mg/dL 99  95  53   BUN 8 - 23 mg/dL 45  29  49   Creatinine 0.61 - 1.24 mg/dL 1.61  0.96  0.45   Sodium 135 - 145 mmol/L 136  135  139   Potassium 3.5 - 5.1 mmol/L 5.1  3.8  5.1   Chloride 98 - 111 mmol/L 95  95  97   CO2 22 - 32 mmol/L 23  21  15    Calcium 8.9 - 10.3 mg/dL 8.3  8.4  7.9     PERIPHERAL VASCULAR CATHETERIZATION Result Date: 04/24/2023 See surgical note for result.   Disposition Plan & Communication  Patient status: Inpatient  Admitted From: Home Planned disposition location: Home Anticipated discharge date: 3/5 pending clinical improvement   Family Communication: none at bedside    Author: Leeroy Bock, DO [facility] 04/24/2023, 5:06 PM   Available by Epic secure chat  7AM-7PM. If 7PM-7AM, please contact night-coverage.  [facility] contact information found on [email or web address].

## 2023-04-24 NOTE — Progress Notes (Signed)
 Bedside report/handoff given to [facility], in ICU.

## 2023-04-24 NOTE — Progress Notes (Signed)
 PHARMACY - ANTICOAGULATION CONSULT NOTE  Pharmacy Consult for Heparin  Indication: limb ischemia   No Known Allergies  Patient Measurements: Height: 5\' 2"  (157.5 cm) Weight: 55.4 kg (122 lb 2.2 oz) IBW/kg (Calculated) : 54.6 Heparin Dosing Weight: 53 kg   Vital Signs: Temp: 98 F (36.7 C) (03/01 2352) Temp Source: Oral (03/01 2352) BP: 149/94 (03/01 2352) Pulse Rate: 77 (03/01 2352)  Labs: Recent Labs    04/23/23 0420 04/23/23 0510 04/23/23 1415 04/24/23 0003  HGB 13.6  --  12.9*  --   HCT 46.8  --  42.6  --   PLT 272  --  281  --   APTT  --  24 44* 45*  LABPROT  --  14.4  --   --   INR  --  1.1  --   --   HEPARINUNFRC  --  <0.10*  --   --   CREATININE 7.28*  --  4.65*  --     Estimated Creatinine Clearance: 12.2 mL/min (A) (by C-G formula based on SCr of 4.65 mg/dL (H)).   Medical History: Past Medical History:  Diagnosis Date   Aortic atherosclerosis (HCC)    Bilateral carotid artery disease (HCC)    Bladder cancer (HCC)    Coronary artery disease 12/20/2018   a.) LHC 12/20/2018: 50% OM1, 40% OM2, 95% o-pLAD, 75% o=pLCx, 40% mLM, 70% D1, 60% mRCA-1, 50% mRCA-2; refer to CVTS. b.) 4v CABG at [facility] on 12/27/2018: LIMA-LAD, RIMA-PDA, seg LRA-OM1-D1   DCM (dilated cardiomyopathy) (HCC) 12/05/2018   a.) TTE 12/05/2018: EF 40-45%. b.) TTE 12/28/2019: EF 20-25%.   Dyspnea 10/15/2022   ESRD (end stage renal disease) (HCC)    a.) T-Th-Sat   HFrEF (heart failure with reduced ejection fraction) (HCC) 12/05/2018   a.) TTE 12/05/2018: EF 40-45%; mild LVH; ant/apical/sep HK; mild TR . b.) TTE 12/28/2019: EF 20-25%; mod LVH; mod MR/AR; G1DD.   History of 2019 novel coronavirus disease (COVID-19) 04/08/2021   History of kidney stones    HLD (hyperlipidemia)    Hx of CABG 12/27/2018   Hypertension    Infrarenal abdominal aortic aneurysm (AAA) without rupture (HCC) 03/05/2021   a.) CT abd/pelvis; measured 3.2 cm.   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 60 tablet 0    aspirin EC 81 MG tablet Take 81 mg by mouth daily.      atorvastatin (LIPITOR) 20 MG tablet Take 1 tablet (20 mg total) by mouth daily. 30 tablet 11    HYDROcodone-acetaminophen (NORCO/VICODIN) 5-325 MG tablet Take 1 tablet by mouth every 6 (six) hours as needed for severe pain (pain score 7-10). 20 tablet 0    isosorbide dinitrate (ISORDIL) 10 MG tablet Take 1 tablet (10 mg total) by mouth 3 (three) times daily. 90 tablet 0    pantoprazole (PROTONIX) 40 MG tablet Take 1 tablet (40 mg total) by mouth 2 (two) times daily before a meal. 60 tablet 0    sevelamer  carbonate (RENVELA) 800 MG tablet Take 1,600 mg by mouth 3 (three) times daily.       Assessment: Pharmacy consulted to dose heparin in this 68 year old male admitted with limb ischemia.  Pt was on Eliquis 5 mg PO BID PTA, last dose on 2/27 at unknown time.  CrCl = 8 ml/min   3/1 1415 aPTT 44 Subtherapeutic  inc from 850 to 1050 units/hr 3/2 0003 aPTT 45       SUBtherapeutic at 1050 units/hr   Goal of Therapy:  Heparin level 0.3-0.7 units/ml aPTT 66 - 102  seconds Monitor platelets by anticoagulation protocol: Yes   Plan:  3/2:  aPTT @ 0003 = 45, SUBtherapeutic - Will order heparin 1600 units IV X 1 bolus and increase drip rate to 1250 units/hr  - Will use aPTT to guide dosing unitil correlating with HL - Will check aPTT and HL 8 hrs after rate change - vascular planning intervention in am of 04/24/23 - CBC daily  Gisell Buehrle D Clinical  Pharmacist 04/24/2023

## 2023-04-24 NOTE — Progress Notes (Signed)
 Unable to doppler right pedal or tibial pulses on pt. NP Modou notified of change; per NP he will reach out to vascular surgeon.

## 2023-04-24 NOTE — Interval H&P Note (Signed)
 History and Physical Interval Note:  04/24/2023 8:25 AM  Anthony Hess  has presented today for surgery, with the diagnosis of ischemia.  The various methods of treatment have been discussed with the patient and family. After consideration of risks, benefits and other options for treatment, the patient has consented to  Procedure(s): Lower Extremity Angiography (Right) as a surgical intervention.  The patient's history has been reviewed, patient examined, no change in status, stable for surgery.  I have reviewed the patient's chart and labs.  Questions were answered to the patient's satisfaction.     [name]

## 2023-04-24 NOTE — Plan of Care (Signed)
  Problem: Education: Goal: Knowledge of General Education information will improve Description: Including pain rating scale, medication(s)/side effects and non-pharmacologic comfort measures Outcome: Progressing   Problem: Health Behavior/Discharge Planning: Goal: Ability to manage health-related needs will improve Outcome: Progressing   Problem: Clinical Measurements: Goal: Ability to maintain clinical measurements within normal limits will improve Outcome: Progressing Goal: Will remain free from infection Outcome: Progressing Goal: Cardiovascular complication will be avoided Outcome: Progressing   Problem: Coping: Goal: Level of anxiety will decrease Outcome: Progressing   Problem: Elimination: Goal: Will not experience complications related to bowel motility Outcome: Progressing Goal: Will not experience complications related to urinary retention Outcome: Progressing   Problem: Pain Managment: Goal: General experience of comfort will improve and/or be controlled Outcome: Progressing   Problem: Safety: Goal: Ability to remain free from injury will improve Outcome: Progressing   Problem: Skin Integrity: Goal: Risk for impaired skin integrity will decrease Outcome: Progressing   Problem: Cardiovascular: Goal: Vascular access site(s) Level 0-1 will be maintained Outcome: Progressing

## 2023-04-24 NOTE — Progress Notes (Signed)
 [facility]  ROUNDING NOTE   Subjective:  Anthony Hess is a 68 year old male with past medical conditions including hypertension, PVD, dyslipidemia, CAD, four-vessel CABG, and end-stage renal disease on hemodialysis.    Patient is known to our practice and receives outpatient dialysis at [facility] on a TTS schedule followed by Dr. Cherylann Ratel. Update: Patient transferred to higher level of care post procedure of vascularization of BLE. Patient in no apparent distress. Patient endorses concern for appearance of the leg.   Objective:  Vital signs in last 24 hours:  Temp:  [98 F (36.7 C)-98.8 F (37.1 C)] 98.4 F (36.9 C) (03/02 1600) Pulse Rate:  [0-97] 73 (03/02 1604) Resp:  [8-26] 18 (03/02 1604) BP: (114-200)/(77-162) 118/90 (03/02 1604) SpO2:  [82 %-100 %] 91 % (03/02 1604)  Weight change: 2.9 kg Filed Weights   04/23/23 0413 04/23/23 1032 04/23/23 1312  Weight: 53 kg 55.9 kg 55.4 kg    Intake/Output: I/O last 3 completed shifts: In: 428.5 [P.O.:240; I.V.:188.5] Out: 500 [Other:500]   Intake/Output this shift:  Total I/O In: 159.6 [I.V.:159.6] Out: -   Physical Exam: General: NAD,   Head: Normocephalic, atraumatic. Moist oral mucosal membranes  Eyes: Anicteric, PERRL  Neck: Supple, trachea midline  Lungs:  Clear to auscultation  Heart: Regular rate and rhythm  Abdomen:  Soft, nontender,   Extremities:  No peripheral edema.  Neurologic: Nonfocal, moving all four extremities  Skin: No lesions  Access: Left chest permcath    Basic Metabolic Panel: Recent Labs  Lab 04/23/23 0420 04/23/23 1415 04/23/23 1712 04/24/23 0544  NA 139 135  --  136  K 5.1 3.8  --  5.1  CL 97* 95*  --  95*  CO2 15* 21*  --  23  GLUCOSE 53* 95  --  99  BUN 49* 29*  --  45*  CREATININE 7.28* 4.65*  --  7.15*  CALCIUM 7.9* 8.4*  --  8.3*  PHOS  --   --  7.3*  --     Liver Function Tests: Recent Labs  Lab 04/23/23 1415 04/24/23 0544  AST 38 40  ALT 22 21   ALKPHOS 76 66  BILITOT 0.7 0.4  PROT 8.1 8.1  ALBUMIN 3.9 3.7   No results for input(s): "LIPASE", "AMYLASE" in the last 168 hours. No results for input(s): "AMMONIA" in the last 168 hours.  CBC: Recent Labs  Lab 04/23/23 0420 04/23/23 1415 04/24/23 0544 04/24/23 1138 04/24/23 1626  WBC 14.3* 13.3* 12.1* 11.8* 12.0*  NEUTROABS 11.1* 10.1*  --   --   --   HGB 13.6 12.9* 13.6 13.3 12.7*  HCT 46.8 42.6 44.8 44.2 41.9  MCV 91.2 87.3 88.9 89.1 87.5  PLT 272 281 279 286 281    Cardiac Enzymes: No results for input(s): "CKTOTAL", "CKMB", "CKMBINDEX", "TROPONINI" in the last 168 hours.  BNP: Invalid input(s): "POCBNP"  CBG: Recent Labs  Lab 04/23/23 2351 04/24/23 0350 04/24/23 0755 04/24/23 1128 04/24/23 1555  GLUCAP 112* 152* 97 75 118*    Microbiology: Results for orders placed or performed during the hospital encounter of 04/23/23  MRSA Next Gen by PCR, Nasal     Status: None   Collection Time: 04/24/23 11:34 AM   Specimen: Nasal Mucosa; Nasal Swab  Result Value Ref Range Status   MRSA by PCR Next Gen NOT DETECTED NOT DETECTED Final    Comment: (NOTE) The GeneXpert MRSA Assay (FDA approved for NASAL specimens only), is one component of a  comprehensive MRSA colonization surveillance program. It is not intended to diagnose MRSA infection nor to guide or monitor treatment for MRSA infections. Test performance is not FDA approved in patients less than 69 years old. Performed at [facility], [address] [place], [place] [address]     Coagulation Studies: Recent Labs    04/23/23 0510  LABPROT 14.4  INR 1.1    Urinalysis: No results for input(s): "COLORURINE", "LABSPEC", "PHURINE", "GLUCOSEU", "HGBUR", "BILIRUBINUR", "KETONESUR", "PROTEINUR", "UROBILINOGEN", "NITRITE", "LEUKOCYTESUR" in the last 72 hours.  Invalid input(s): "APPERANCEUR"    Imaging: PERIPHERAL VASCULAR CATHETERIZATION Result Date: 04/24/2023 See surgical note for  result.    Medications:    sodium chloride Stopped (04/24/23 1307)   alteplase (LIMB ISCHEMIA) 10 mg in normal saline (0.02 mg/mL) infusion 0.5 mg/hr (04/24/23 1655)   esmolol     heparin 600 Units/hr (04/24/23 1608)    atorvastatin  20 mg Oral Daily   calcium acetate  2,001 mg Oral TID WC   Chlorhexidine Gluconate Cloth  6 each Topical Q0600   fentaNYL (SUBLIMAZE) injection  50 mcg Intravenous Once   nicotine  14 mg Transdermal Daily   pantoprazole  40 mg Oral BID AC   sodium chloride flush  3 mL Intravenous Q12H   sodium chloride, hydrALAZINE, HYDROmorphone (DILAUDID) injection, ondansetron **OR** ondansetron (ZOFRAN) IV, oxyCODONE, sodium chloride flush  Assessment/ Plan:  [name] is a 68 y.o.  male [name] is a 68 y.o.  male with past medical conditions ESRD on HD T/T/S, hypertension, dyslipidemia, CAD, four-vessel CABG, COPD and peripheral vascular disease presenting with critical limb ischemia.     End stage renal disease on hemodialysis T/TH/S.  Patient received 2.10hr of dialysis Saturday. Patient refused to stay full treatment due to feeling bad. New CVC exchanged 3/2. Intermittent bleeding at sight. Next treatment scheduled for Tuesday.   2. Anemia of chronic kidney disease Recent Labs           Lab Results  Component Value Date    HGB 12.0 04/24/2023        3. Secondary Hyperparathyroidism:      Recent Labs           Lab Results  Component Value Date    PTH 170 (H) 06/25/2022    CALCIUM 8.3 (L) 04/24/2023    CAION 0.82 (LL) 10/13/2022     Phos  7.3 (H)  04/23/2023      Outpatient labs 2/13 phos 15, PTH 321 Will resume home Calcium acetate 3 tab PO TID with meals.    4.  Peripheral vascular disease S/p angioplasty of right leg with stent placement on 12/23/2022. Recent thrombectomy and stent placement with vascular 03/07/2023. Still smoking 1/2 PPD Procedure today, LLE warm to the touch. RLE cold to the touch. Plan for procedure to  revascularize of RLE tomorrow   LOS: 0 Anthony Hess Anthony Hess 3/2/20255:16 PM

## 2023-04-24 NOTE — Progress Notes (Signed)
 OT Cancellation Note  Patient Details Name: Anthony Hess MRN: [id number] DOB: December 09, 1955   Cancelled Treatment:    Reason Eval/Treat Not Completed: Patient not medically ready. Consult received, chart reviewed. Pt pending vascular procedure today. Will follow up at later date/time following procedure as appropriate.   Arman Filter., MPH, MS, OTR/L ascom [phone] 04/24/23, 8:02 AM

## 2023-04-24 NOTE — Op Note (Signed)
 OPERATIVE NOTE    PRE-OPERATIVE DIAGNOSIS: 1. ESRD 2. Non-functional permcath  POST-OPERATIVE DIAGNOSIS: same as above  PROCEDURE: Fluoroscopic guidance for placement of catheter Placement of a 19 cm tip to cuff tunneled hemodialysis catheter via the right internal jugular vein and removal of previous catheter  SURGEON: [name], MD  ANESTHESIA:  Local with moderate conscious sedation for 53 minutes using 2 mg of Versed and 50 mcg of Fentanyl (for both procedures)  ESTIMATED BLOOD LOSS: 3 cc  FINDING(S): none  SPECIMEN(S):  None  INDICATIONS:   Patient is a 68 y.o.male who presents with non-functional dialysis catheter and ESRD.  The patient needs long term dialysis access for their ESRD, and a Permcath is necessary.  Risks and benefits are discussed and informed consent is obtained.    DESCRIPTION: After obtaining full informed written consent, the patient was brought back to the vascular suite. The patient received moderate conscious sedation during a face-to-face encounter with me present throughout the entire procedure and supervising the RN monitoring the vital signs, pulse oximetry, telemetry, and mental status throughout the entire procedure. The patient's existing catheter, right neck and chest were sterilely prepped and draped in a sterile surgical field was created.  The existing catheter was dissected free from the fibrous sheath securing the cuff with hemostats and blunt dissection.  A wire was placed. The existing catheter was then removed and the wire used to keep venous access. I selected a 19 cm tip to cuff tunneled dialysis catheter.  Using fluoroscopic guidance the catheter tips were parked in the right atrium. The appropriate distal connectors were placed. It withdrew blood well and flushed easily with heparinized saline and a concentrated heparin solution was then placed. It was secured to the chest wall with 2 Prolene sutures. A 4-0 Monocryl pursestring suture was  placed around the exit site. Sterile dressings were placed. The patient tolerated the procedure well and was taken to the recovery room in stable condition.  COMPLICATIONS: None  CONDITION: Stable  [name] 04/24/2023 10:24 AM   This note was created with Dragon Medical transcription system. Any errors in dictation are purely unintentional.

## 2023-04-24 NOTE — Progress Notes (Signed)
 Assessed right internal jugular HD cath upon entering room. Bandage appeared with new increased bloody drainage. Quarter size drainage of blood documented on admission to unit. NP and CN to bedside to assess site. NP advised RN to reinforce dressing as removing may remove occluding clot formation. Dressing reinforced with clean gauze and tape. Will continue to monitor closely. Bleeding occurred s/p starting alteplase and heparin infusion into left femoral sheath.

## 2023-04-24 NOTE — Progress Notes (Signed)
 No new bleeding on reinforced bandage at perm cath site at this time.

## 2023-04-24 NOTE — Op Note (Addendum)
 [facility]  Percutaneous Study/Intervention Procedural Note   Date of Surgery: 04/24/2023  Surgeon(s):Carlito Bogert    Assistants:none  Pre-operative Diagnosis: PAD with rest pain right lower extremity, acute to subacute on chronic ischemia, ESRD, nonfunctional PermCath  Post-operative diagnosis:  Same  Procedure(s) Performed:             1.  Ultrasound guidance for vascular access left femoral artery             2.  Catheter placement into right tibioperoneal trunk from left femoral approach             3.  Aortogram and selective right lower extremity angiogram             4.  Mechanical thrombectomy with the penumbra CAT 6 device to the right superficial femoral artery, popliteal artery, and tibioperoneal trunk             5.  Placement of a continuous thrombolytic catheter for continuous thrombolytic therapy using a 135 cm total length 50 cm working length catheter from the right common femoral artery down to the right tibioperoneal trunk and instillation of 8 mg of tPA and this catheter  6.  Ultrasound guidance for vascular access left femoral vein             7.  Placement of the left femoral venous triple-lumen catheter  EBL: 50 cc  Contrast: 45 cc  Fluoro Time: 5.3 minutes  Moderate Conscious Sedation Time: approximately 53 minutes using 2 mg of Versed and 50 mcg of Fentanyl (for all procedures)              Indications:  Patient is a 68 y.o.male with an ischemic right lower extremity with about 1 week of rest pain with a long history of peripheral arterial disease and multiple previous interventions to both lower extremities.  He also has a nonfunctional right jugular PermCath and the exchange of this PermCath will be performed but dictated separately.  The patient has a long history of noncompliance and clinically it was clear that he had thrombosed his right SFA stents and had extremely poor perfusion distally.  The patient is brought in for angiography  for further evaluation and potential treatment.  Due to the limb threatening nature of the situation, angiogram was performed for attempted limb salvage. The patient is aware that if the procedure fails, amputation would be expected.  The patient also understands that even with successful revascularization, amputation may still be required due to the severity of the situation.  Risks and benefits are discussed and informed consent is obtained.   Procedure:  The patient was identified and appropriate procedural time out was performed.  The patient was then placed supine on the table and prepped and draped in the usual sterile fashion. Moderate conscious sedation was administered during a face to face encounter with the patient throughout the procedure with my supervision of the RN administering medicines and monitoring the patient's vital signs, pulse oximetry, telemetry and mental status throughout from the start of the procedure until the patient was taken to the recovery room. Ultrasound was used to evaluate the left common femoral artery.  It was patent .  A digital ultrasound image was acquired.  A Seldinger needle was used to access the left common femoral artery under direct ultrasound guidance and a permanent image was performed.  A 0.035 J wire was advanced without resistance and a 5Fr sheath was placed.  Pigtail catheter was  placed into the aorta and an AP aortogram was performed. This demonstrated what appeared to be a fairly normal right renal artery.  The left renal artery did not appear to have flow.  He is a chronic dialysis patient.  His aorta and iliac arteries were calcific and tortuous but no obvious stenosis was identified. I then crossed the aortic bifurcation and advanced to the right femoral head. Selective right lower extremity angiogram was then performed. This demonstrated thrombosis of his entire right SFA and popliteal arteries that have been previously stented.  Distal perfusion was  extremely difficult to identify in part due to the sluggish flow in part due to patient motion.  A wire easily went across the lesion and took a Kumpe catheter down to the tibioperoneal trunk to opacify the tibial vessels.  There was thrombosis with occlusion of the tibioperoneal trunk, proximal peroneal artery with reconstitution of the mid peroneal artery.  The anterior tibial artery occluded proximally and did not appear to reconstitute distally.  No posterior tibial artery was seen. It was felt that it was in the patient's best interest to proceed with intervention after these images to avoid a second procedure and a larger amount of contrast and fluoroscopy based off of the findings from the initial angiogram. The patient was systemically heparinized and a 6 [place] [employer] sheath was then placed over the [employer] wire. I then used a Kumpe catheter and the advantage wire to get down to the tibioperoneal trunk and then exchanged for a V18 wire.  I began by using the penumbra CAT 6 device and performing mechanical thrombectomy.  We began mechanical thrombectomy through the right SFA and advanced the catheter down through the popliteal artery and into the tibioperoneal trunk.  3 passes were made with a CAT 6 device and filled with clot on each effort.  Despite large amounts of thrombus being removed, there remained no flow through the stents and no flow distally with the thrombosed tibial vessels.  It was fairly clear that our only hope of revascularization and limb salvage at this point would likely be a thrombolytic catheter so I elected to place this.  A 135 cm total length 50 cm working length catheter was placed with the proximal extent in the common femoral artery and the distal extent in the tibioperoneal trunk.  It was secured into place with a silk suture as well as the destination sheath.  8 mg of tPA were then instilled through this thrombolytic catheter to begin the thrombolytic process and  this will have continuous thrombolytic therapy until he is brought back tomorrow for second look angiography.  For frequent lab draws on thrombolytic therapy and durable venous access, a central line was then placed.  With the left groin already prepped and draped, the left femoral vein was visualized with ultrasound and found to be widely patent.  It was then accessed under direct ultrasound guidance without difficulty with a Seldinger needle.  A J-wire was placed.  After skin nick and dilatation a triple-lumen catheter was placed over the wire and the wire was removed.  All 3 lm withdrew dark red nonpulsatile blood and flushed easily with sterile saline.  It was then secured in place with 2 silk sutures.  The PermCath exchange will be dictated as a separate note but was performed next prior to going to the recovery room. I elected to terminate the procedure.   Findings:  Aortogram:  This demonstrated what appeared to be a fairly normal right renal artery.  The left renal artery did not appear to have flow.  He is a chronic dialysis patient.  His aorta and iliac arteries were calcific and tortuous but no obvious stenosis was identified.             Right lower Extremity:  This demonstrated thrombosis of his entire right SFA and popliteal arteries that have been previously stented.  Distal perfusion was extremely difficult to identify in part due to the sluggish flow in part due to patient motion.  A wire easily went across the lesion and took a Kumpe catheter down to the tibioperoneal trunk to opacify the tibial vessels.  There was thrombosis with occlusion of the tibioperoneal trunk, proximal peroneal artery with reconstitution of the mid peroneal artery.  The anterior tibial artery occluded proximally and did not appear to reconstitute distally.  No posterior tibial artery was seen.   Disposition: Patient was taken to the recovery room in stable condition having tolerated the procedure  well.  Complications: None  [name] 04/24/2023 10:26 AM   This note was created with Dragon Medical transcription system. Any errors in dictation are purely unintentional.

## 2023-04-24 NOTE — Progress Notes (Signed)
 PT Cancellation Note  Patient Details Name: Anthony Hess MRN: [id number] DOB: [birth year]   Cancelled Treatment:    Reason Eval/Treat Not Completed: Other (comment). Chart reviewed, pt pending vascular intervention, PT to re-attempt as able.   Olga Coaster PT, DPT 8:02 AM,04/24/23

## 2023-04-25 ENCOUNTER — Encounter (INDEPENDENT_AMBULATORY_CARE_PROVIDER_SITE_OTHER): Payer: Medicare Other

## 2023-04-25 ENCOUNTER — Ambulatory Visit (INDEPENDENT_AMBULATORY_CARE_PROVIDER_SITE_OTHER): Payer: Medicare Other | Admitting: Vascular Surgery

## 2023-04-25 ENCOUNTER — Encounter: Payer: Self-pay | Admitting: Vascular Surgery

## 2023-04-25 ENCOUNTER — Inpatient Hospital Stay: Admitting: Anesthesiology

## 2023-04-25 ENCOUNTER — Ambulatory Visit: Admission: RE | Admit: 2023-04-25 | Payer: Medicare Other | Source: Home / Self Care | Admitting: Vascular Surgery

## 2023-04-25 ENCOUNTER — Encounter: Admission: EM | Disposition: E | Payer: Self-pay | Source: Ambulatory Visit | Attending: Family Medicine

## 2023-04-25 DIAGNOSIS — I743 Embolism and thrombosis of arteries of the lower extremities: Secondary | ICD-10-CM

## 2023-04-25 DIAGNOSIS — Z9889 Other specified postprocedural states: Secondary | ICD-10-CM | POA: Diagnosis not present

## 2023-04-25 DIAGNOSIS — T82868A Thrombosis of vascular prosthetic devices, implants and grafts, initial encounter: Secondary | ICD-10-CM | POA: Diagnosis not present

## 2023-04-25 DIAGNOSIS — N186 End stage renal disease: Secondary | ICD-10-CM

## 2023-04-25 DIAGNOSIS — I70221 Atherosclerosis of native arteries of extremities with rest pain, right leg: Secondary | ICD-10-CM

## 2023-04-25 DIAGNOSIS — I70223 Atherosclerosis of native arteries of extremities with rest pain, bilateral legs: Secondary | ICD-10-CM | POA: Diagnosis not present

## 2023-04-25 HISTORY — PX: LOWER EXTREMITY INTERVENTION: CATH118252

## 2023-04-25 LAB — BASIC METABOLIC PANEL
Anion gap: 23 — ABNORMAL HIGH (ref 5–15)
Anion gap: 26 — ABNORMAL HIGH (ref 5–15)
Anion gap: 28 — ABNORMAL HIGH (ref 5–15)
BUN: 85 mg/dL — ABNORMAL HIGH (ref 8–23)
BUN: 85 mg/dL — ABNORMAL HIGH (ref 8–23)
BUN: 93 mg/dL — ABNORMAL HIGH (ref 8–23)
CO2: 17 mmol/L — ABNORMAL LOW (ref 22–32)
CO2: 16 mmol/L — ABNORMAL LOW (ref 22–32)
CO2: 15 mmol/L — ABNORMAL LOW (ref 22–32)
Calcium: 7.4 mg/dL — ABNORMAL LOW (ref 8.9–10.3)
Calcium: 7.5 mg/dL — ABNORMAL LOW (ref 8.9–10.3)
Calcium: 7.9 mg/dL — ABNORMAL LOW (ref 8.9–10.3)
Chloride: 93 mmol/L — ABNORMAL LOW (ref 98–111)
Chloride: 92 mmol/L — ABNORMAL LOW (ref 98–111)
Chloride: 92 mmol/L — ABNORMAL LOW (ref 98–111)
Creatinine, Ser: 10.18 mg/dL — ABNORMAL HIGH (ref 0.61–1.24)
Creatinine, Ser: 10.27 mg/dL — ABNORMAL HIGH (ref 0.61–1.24)
Creatinine, Ser: 10.29 mg/dL — ABNORMAL HIGH (ref 0.61–1.24)
GFR, Estimated: 5 mL/min — ABNORMAL LOW (ref >60)
GFR, Estimated: 5 mL/min — ABNORMAL LOW (ref >60)
GFR, Estimated: 5 mL/min — ABNORMAL LOW (ref >60)
Glucose, Bld: 95 mg/dL (ref 70–99)
Glucose, Bld: 78 mg/dL (ref 70–99)
Glucose, Bld: 98 mg/dL (ref 70–99)
Potassium: 7.4 mmol/L (ref 3.5–5.1)
Potassium: 7.1 mmol/L (ref 3.5–5.1)
Potassium: 7.1 mmol/L (ref 3.5–5.1)
Sodium: 133 mmol/L — ABNORMAL LOW (ref 135–145)
Sodium: 134 mmol/L — ABNORMAL LOW (ref 135–145)
Sodium: 135 mmol/L (ref 135–145)

## 2023-04-25 LAB — CBC
HCT: 41.2 % (ref 39.0–52.0)
Hemoglobin: 12.6 g/dL — ABNORMAL LOW (ref 13.0–17.0)
MCH: 27.2 pg (ref 26.0–34.0)
MCHC: 30.6 g/dL (ref 30.0–36.0)
MCV: 89 fL (ref 80.0–100.0)
Platelets: 245 10*3/uL (ref 150–400)
RBC: 4.63 MIL/uL (ref 4.22–5.81)
RDW: 20.7 % — ABNORMAL HIGH (ref 11.5–15.5)
WBC: 12 10*3/uL — ABNORMAL HIGH (ref 4.0–10.5)
nRBC: 0 % (ref 0.0–0.2)

## 2023-04-25 LAB — GLUCOSE, CAPILLARY
Glucose-Capillary: 162 mg/dL — ABNORMAL HIGH (ref 70–99)
Glucose-Capillary: 88 mg/dL (ref 70–99)
Glucose-Capillary: 84 mg/dL (ref 70–99)
Glucose-Capillary: 84 mg/dL (ref 70–99)

## 2023-04-25 LAB — POTASSIUM: Potassium: 6.3 mmol/L (ref 3.5–5.1)

## 2023-04-25 LAB — HEPARIN LEVEL (UNFRACTIONATED): Heparin Unfractionated: <0.1 [IU]/mL — ABNORMAL LOW (ref 0.30–0.70)

## 2023-04-25 LAB — FIBRINOGEN: Fibrinogen: 481 mg/dL — ABNORMAL HIGH (ref 210–475)

## 2023-04-25 LAB — POTASSIUM (ARMC VASCULAR LAB ONLY): Potassium (ARMC vascular lab): 6.5 mmol/L (ref 3.5–5.1)

## 2023-04-25 LAB — HEMOGLOBIN A1C
Hgb A1c MFr Bld: 5.8 % — ABNORMAL HIGH (ref 4.8–5.6)
Mean Plasma Glucose: 119.76 mg/dL

## 2023-04-25 MED ORDER — FENTANYL CITRATE (PF) 100 MCG/2ML IJ SOLN
INTRAMUSCULAR | Status: AC
  Filled 2023-04-25: qty 2

## 2023-04-25 MED ORDER — TIROFIBAN (AGGRASTAT) BOLUS VIA INFUSION
25.0000 ug/kg | Freq: Once | INTRAVENOUS | Status: AC
  Administered 2023-04-25: 1385 ug via INTRAVENOUS
  Filled 2023-04-25: qty 28

## 2023-04-25 MED ORDER — FENTANYL CITRATE (PF) 100 MCG/2ML IJ SOLN
INTRAMUSCULAR | Status: DC | PRN
  Administered 2023-04-25: 12.5 ug via INTRAVENOUS
  Administered 2023-04-25: 50 ug via INTRAVENOUS
  Administered 2023-04-25: 25 ug via INTRAVENOUS

## 2023-04-25 MED ORDER — HEPARIN (PORCINE) IN NACL 1000-0.9 UT/500ML-% IV SOLN
INTRAVENOUS | Status: DC | PRN
  Administered 2023-04-25: 1000 mL

## 2023-04-25 MED ORDER — MIDAZOLAM HCL 5 MG/5ML IJ SOLN
INTRAMUSCULAR | Status: AC
Start: 2023-04-25 — End: ?
  Filled 2023-04-25: qty 5

## 2023-04-25 MED ORDER — ETOMIDATE 2 MG/ML IV SOLN
INTRAVENOUS | Status: AC
  Filled 2023-04-25: qty 10

## 2023-04-25 MED ORDER — NOREPINEPHRINE 4 MG/250ML-% IV SOLN
INTRAVENOUS | Status: AC
  Filled 2023-04-25: qty 250

## 2023-04-25 MED ORDER — ROCURONIUM BROMIDE 10 MG/ML (PF) SYRINGE
PREFILLED_SYRINGE | INTRAVENOUS | Status: AC
Start: 2023-04-25 — End: 2023-04-26
  Filled 2023-04-25: qty 10

## 2023-04-25 MED ORDER — HEPARIN SODIUM (PORCINE) 1000 UNIT/ML IJ SOLN
INTRAMUSCULAR | Status: AC
  Filled 2023-04-25: qty 10

## 2023-04-25 MED ORDER — IODIXANOL 320 MG/ML IV SOLN
INTRAVENOUS | Status: DC | PRN
  Administered 2023-04-25: 70 mL via INTRA_ARTERIAL

## 2023-04-25 MED ORDER — MENTHOL 3 MG MT LOZG: 1.0000 | LOZENGE | OROMUCOSAL | Status: DC | PRN

## 2023-04-25 MED ORDER — CEFAZOLIN SODIUM-DEXTROSE 1-4 GM/50ML-% IV SOLN
1.0000 g | Freq: Once | INTRAVENOUS | Status: AC
  Administered 2023-04-25: 1 g via INTRAVENOUS

## 2023-04-25 MED ORDER — CALCIUM GLUCONATE-NACL 1-0.675 GM/50ML-% IV SOLN
1.0000 g | Freq: Once | INTRAVENOUS | Status: AC
  Administered 2023-04-25: 1000 mg via INTRAVENOUS
  Filled 2023-04-25: qty 50

## 2023-04-25 MED ORDER — TIROFIBAN HCL IN NACL 5-0.9 MG/100ML-% IV SOLN
INTRAVENOUS | Status: AC
  Filled 2023-04-25: qty 100

## 2023-04-25 MED ORDER — CEFAZOLIN SODIUM-DEXTROSE 1-4 GM/50ML-% IV SOLN
1.0000 g | Freq: Three times a day (TID) | INTRAVENOUS | Status: DC
Start: 2023-04-25 — End: 2023-04-25

## 2023-04-25 MED ORDER — MIDAZOLAM HCL 5 MG/5ML IJ SOLN
INTRAMUSCULAR | Status: AC
  Filled 2023-04-25: qty 5

## 2023-04-25 MED ORDER — SODIUM ZIRCONIUM CYCLOSILICATE 5 G PO PACK
5.0000 g | PACK | Freq: Three times a day (TID) | ORAL | Status: DC
  Administered 2023-04-25: 5 g via ORAL
  Filled 2023-04-25: qty 1

## 2023-04-25 MED ORDER — TIROFIBAN HCL IN NACL 5-0.9 MG/100ML-% IV SOLN
0.0750 ug/kg/min | INTRAVENOUS | Status: DC
  Administered 2023-04-25: 0.075 ug/kg/min via INTRAVENOUS
  Filled 2023-04-25: qty 100

## 2023-04-25 MED ORDER — DEXTROSE-SODIUM CHLORIDE 5-0.9 % IV SOLN: INTRAVENOUS | Status: AC

## 2023-04-25 MED ORDER — HEPARIN SODIUM (PORCINE) 1000 UNIT/ML IJ SOLN
INTRAMUSCULAR | Status: DC | PRN
  Administered 2023-04-25 (×2): 3000 [IU] via INTRAVENOUS

## 2023-04-25 MED ORDER — SODIUM CHLORIDE 0.9 % IV SOLN: INTRAVENOUS | Status: DC

## 2023-04-25 MED ORDER — ALBUTEROL SULFATE (2.5 MG/3ML) 0.083% IN NEBU
2.5000 mg | INHALATION_SOLUTION | RESPIRATORY_TRACT | Status: AC
  Administered 2023-04-25: 2.5 mg via RESPIRATORY_TRACT
  Filled 2023-04-25: qty 3

## 2023-04-25 MED ORDER — CEFAZOLIN SODIUM-DEXTROSE 1-4 GM/50ML-% IV SOLN
INTRAVENOUS | Status: AC
  Filled 2023-04-25: qty 50

## 2023-04-25 MED ORDER — MIDAZOLAM HCL 2 MG/2ML IJ SOLN
INTRAMUSCULAR | Status: DC | PRN
Start: 2023-04-25 — End: 2023-04-25
  Administered 2023-04-25: 2 mg via INTRAVENOUS
  Administered 2023-04-25: 1 mg via INTRAVENOUS
  Administered 2023-04-25: .5 mg via INTRAVENOUS

## 2023-04-25 MED ORDER — INSULIN ASPART 100 UNIT/ML IJ SOLN: 0.0000 [IU] | INTRAMUSCULAR | Status: DC

## 2023-04-25 MED ORDER — ASPIRIN 81 MG PO TBEC
81.0000 mg | DELAYED_RELEASE_TABLET | Freq: Every day | ORAL | Status: DC
  Administered 2023-04-25: 81 mg via ORAL
  Filled 2023-04-25: qty 1

## 2023-04-25 MED ORDER — LIDOCAINE-EPINEPHRINE (PF) 1 %-1:200000 IJ SOLN
INTRAMUSCULAR | Status: DC | PRN
  Administered 2023-04-25: 10 mL via INTRADERMAL

## 2023-04-26 ENCOUNTER — Encounter: Payer: Self-pay | Admitting: Vascular Surgery

## 2023-04-26 LAB — HEPATITIS B SURFACE ANTIBODY, QUANTITATIVE: Hep B S AB Quant (Post): 85.7 m[IU]/mL

## 2023-04-26 NOTE — Progress Notes (Signed)
 Patient receiving dialysis tonight, Oxygen sats dropping down into the 80's on 6 L [place], patient then became hypotensive, diaphoretic, c/o shortness of breath and being hot. HD RN at bedside assisting with trying to obtain BP and oxygen saturations. Patient agitated and pulling oxygen sensor off. Patient placed on NRB and Lanora Manis, NP called to bedside. Patient was unable to maintain oxygen saturation and intubated at 2308 by Lanora Manis, NP.  Shortly after he went into VFib, code blue was called and chest compressions were started; see code blue sheet, after 3 round of CPR code was stopped and patient was pronounced by Lanora Manis, NP at 2335. Larkin Ina, NP and Psychologist, occupational at bedside during code. Wife Ted Mcalpine) was notified by Larkin Ina, NP. Wife did come to see patient and took patient's yellow ring, watch, belt, pants, shirt and cell phone with her and requested that the patient be taken to [facility] [employer] home in [place]

## 2023-04-26 NOTE — Code Documentation (Addendum)
 CODE BLUE NOTE  Patient Name: Anthony Hess   MRN: [id number]   Date of Birth/ Sex: 03-12-1955 , male      Admission Date: 04/23/2023  Attending Provider: Leeroy Bock, MD  Primary Diagnosis: Critical limb ischemia of both lower extremities [facility])    Brief WUJ:WJXBJ Charter is a 68 year old male hx of hypertension, PVD, dyslipidemia, CAD, four-vessel CABG, ESRD on HD via a right internal jugular Permacath T/TH/Sat. S/P LEFT SFA/Pop Stenting/tibial intervention January 2025; RIGHT SFA/Pop stenting and tibial intervention for limb salvage October 2024 who presented with Acute on Chronic critical RIGHT lower extremity ischemia. He was started on Heparin gtt and underwent angiogram on 04/24/23 and right internal jugular perm replacement. On 04/25/23 was noted with hyperkalemia >7 with EKG changes at 1800 refractory to shifting measures and increased oxygen requirement from 2-6L so HD was recommended. HD was initiated 3/3.  Cardiopulmonary Resuscitation Directed by: Webb Silversmith ICU NP   I personally directed ancillary staff and/or performed CPR in an effort to regain return of spontaneous circulation. Nursing staff, PCCM Webb Silversmith, and [facility] NP present in room.  Indication: Pt was one hour into HD session at approximately 2300, when he was noted to be hypoxic in the low 60's and hypotensive with systolic in the 50's. Aggrastat stopped. He appeared pale and dusky but able to verbalized that he could not breath. NRB was placed with no improvement therefore was manually bagged to improve sats. Monitor showed what appears to be peaked T waves with widened QRS complexes. Patient received 2 amps fo sodium bicarb and before he could receive gluconate he went unresponsive. He was immediately intubated for airway protection with positive color change and sats improvement to the upper 90's. A STAT EKG obtained which was concerning for STEMI and before code STEMI could be activated patient went into Vfib  arrest. Code blue was subsequently called. CPR/ACLS protocol was initiated and patient received 3 rounds of CPR, Defibrillation x 3 and correction of acidosis with multiple sodium bicarb, calcium gluconate, Epi, and magnesium without success. After 3 rounds of CPR with no organized rhythm or pulse, code was called off at 2335.    Technical Description:  - CPR performance duration:  20 minutes  - Was defibrillation or cardioversion used? Yes x3 @200J   - Was external pacer placed? No  - Was patient intubated pre/post CPR? Yes    Medications Administered: Y = Yes; Blank = No Amiodarone    Atropine    Calcium  yes  Epinephrine  yes  Lidocaine    Magnesium  yes  Norepinephrine  yes  Phenylephrine    Sodium bicarbonate  yes  Vasopressin   D50              Post CPR evaluation:  - Final Status - Was patient successfully resuscitated ? NO - What is current rhythm? Ventricular Fibrillation/PEA - What is current hemodynamic status? ROSC NOT ACHIEVED   Miscellaneous Information:  - Labs sent, including: no  - Primary team notified?  Yes (Notified vascular on call, TRH at bedside who will notify attending of the time of death  - Family Notified? Yes        ACLS H's and T's  -Hypovolemia  -Hypoxia -Hydrogen Ion excess (acidosis) -Hypoglycemia -Hypokalemia / Hyperkalemia  -Hypothermia  -Tension pneumothorax  -Toxins  -Thrombosis PE / MI    Webb Silversmith, DNP, CCRN, FNP-C, AGACNP-BC Acute Care & Family Nurse Practitioner  [facility]  See Amion for  personal pager PCCM on call pager [phone] until 7 am

## 2023-04-26 NOTE — Progress Notes (Signed)
   04/25/23 2245  Vitals  ECG Heart Rate (!) 106  Resp (!) 26  During Treatment Monitoring  Blood Flow Rate (mL/min) 349 mL/min  Arterial Pressure (mmHg) -226.45 mmHg  Venous Pressure (mmHg) 181.81 mmHg  TMP (mmHg) 11.11 mmHg  Ultrafiltration Rate (mL/min) 896 mL/min  Dialysate Flow Rate (mL/min) 299 ml/min  Dialysate Potassium Concentration 1  Dialysate Calcium Concentration 2.5  Duration of HD Treatment -hour(s) 1 hour(s)  Cumulative Fluid Removed (mL) per Treatment  411.04  Intra-Hemodialysis Comments See progress note (Blood return in preparation for intubation)  Post Treatment  Dialyzer Clearance Lightly streaked  Tolerated HD Treatment No (Comment)  Hemodialysis Catheter Right Internal jugular Double lumen Permanent (Tunneled)  Placement Date/Time: 04/24/23 1007   Serial / Lot #: [id number]  Expiration Date: 11/18/27  Time Out: Correct patient;Correct site;Correct procedure  Maximum sterile barrier precautions: Hand hygiene;Mask;Sterile gloves;Sterile gown;Large sterile shee...  Site Condition No complications  Blue Lumen Status Flushed;Dead end cap in place  Red Lumen Status Dead end cap in place   Due to dyspnea and low oxygen saturation,Pt blood return in preparation for intubation, Dialysis discontinue for intubation procedure.

## 2023-04-26 NOTE — Progress Notes (Signed)
       CROSS COVER NOTE  NAME: Vencil Basnett MRN: [id number] DOB : 08/30/1955    Date of Service   04/27/2023   HPI/Events of Note   Phone call received from critical care APP reporting that patient acutely decompensated requiring RSI.  About a minute later this NP arrived at bedside.  Patient was observed to be intubated and hypotensive.  Bedside ECG revealed ST elevation in inferior leads.  Prior to contacting on-call STEMI MD, nurse reported that patient lost pulses at that time.  ACLS protocols were commenced.  Resuscitation efforts were directed by the critical care APP.  After several rounds of CPR and ACLS implementation, patient was pronounced dead at 18-May-2333.  Interventions   -This NP called patient's significant other listed on the chart and informed him since the beginning of the code.  They arrived at bedside shortly after patient was pronounced.  Support was offered and they were allowed to remain at bedside.       Holliday Sheaffer Lamin Geradine Girt, MSN, APRN, AGACNP-BC [facility] [place] Pager: [phone]. Check Amion for Availability

## 2023-05-24 NOTE — Op Note (Signed)
 [facility]  Percutaneous Study/Intervention Procedural Note   Date of Surgery: 04/25/2023  Surgeon(s):Arno Cullers    Assistants:none  Pre-operative Diagnosis: PAD with rest pain RLE, s/p overnight thrombolytic therapy  Post-operative diagnosis:  Same  Procedure(s) Performed:             1.  Angiogram through existing catheter right lower extremity             2.  Mechanical thrombectomy to the right SFA and popliteal arteries as well as the right common femoral and profunda femoris artery with the Rota Rex device             3.  Mechanical thrombectomy of the right SFA, popliteal artery, tibioperoneal trunk, and posterior tibial artery with the penumbra CAT 6 device             4.  Percutaneous transluminal angioplasty of the right posterior tibial artery and tibioperoneal trunk with 2.5 mm diameter angioplasty balloon             5.  Percutaneous transluminal angioplasty of the right popliteal artery with 4 mm diameter Lutonix drug-coated angioplasty balloon  6.  Stent placement to the right tibioperoneal trunk and popliteal artery with 5 mm diameter by 15 cm length Viabahn stent  7.  Stent placement to the right SFA with 6 mm diameter by 25 cm length Viabahn stent             8.  StarClose closure device left femoral artery  EBL: 250 cc  Contrast: 70 cc  Fluoro Time: 20 point minutes  Moderate Conscious Sedation Time: approximately 102 minutes using 3.5 mg of Versed and 87.5 mcg of Fentanyl              Indications:  Patient is a 68 y.o.male with an ischemic right leg status post overnight thrombolytic therapy.  Unfortunately, the patient pulled out his thrombolytic catheter late last night and had not received any thrombolytic therapy for about 8 to 10 hours prior to coming in for angiography.  He had remained on heparin.  The patient is brought in for angiography for further evaluation and potential treatment.  Due to the limb threatening nature of the  situation, angiogram was performed for attempted limb salvage. The patient is aware that if the procedure fails, amputation would be expected.  The patient also understands that even with successful revascularization, amputation may still be required due to the severity of the situation.  Risks and benefits are discussed and informed consent is obtained.   Procedure:  The patient was identified and appropriate procedural time out was performed.  The patient was then placed supine on the table and prepped and draped in the usual sterile fashion. Moderate conscious sedation was administered during a face to face encounter with the patient throughout the procedure with my supervision of the RN administering medicines and monitoring the patient's vital signs, pulse oximetry, telemetry and mental status throughout from the start of the procedure until the patient was taken to the recovery room.  Selective right lower extremity angiogram was then performed. This demonstrated thrombus in the common femoral artery and proximal profunda femoris artery, the SFA and popliteal stents were all occluded.  There was extremely sluggish flow distally.  I advanced a Kumpe catheter down to the tibioperoneal trunk where thrombosis of the right peroneal artery and posterior tibial arteries remain thrombosed with no flow distally. It was felt that it was in the patient's best interest  to proceed with intervention after these images to avoid a second procedure and a larger amount of contrast and fluoroscopy based off of the findings from the initial angiogram. The patient was systemically heparinized. I then proceeded with mechanical thrombectomy using the Rota Rex device in the right common femoral artery, SFA, and popliteal arteries.  3 passes were made with the [place] Rex device but no significant improvement was seen.  A catheter was placed down into the posterior tibial artery where selective imaging showed thrombus throughout the  tibioperoneal trunk and the posterior tibial artery.  I then brought the penumbra CAT 6 device onto the field and performed mechanical thrombectomy throughout the SFA, popliteal artery, tibioperoneal trunk, and posterior tibial artery.  3 more passes were made.  Even though thrombus was removed, there remained minimal flow.  There was clearly a high-grade stenosis in the tibioperoneal trunk tracking down to the posterior tibial artery.  The remaining extensive thrombus throughout the stents and there was thrombus and stenosis in the popliteal artery below the stents.  I brought a 2.5 mm diameter by 30 cm length angioplasty balloon on the field to treat the tibioperoneal trunk and posterior tibial artery.  This was inflated to 10 atm for 1 minute.  A 4 mm diameter by 15 cm length Lutonix drug-coated angioplasty balloon was brought onto the field and used to treat the popliteal artery and proximal tibioperoneal trunk inflated to 12 atm 1 minute.  Completion imaging showed significant residual stenosis in the tibioperoneal trunk and sluggish flow in the posterior tibial artery.  I then used a 5 mm diameter by 15 cm length Viabahn stent in the tibioperoneal trunk and popliteal artery.  A 6 mm diameter by 25 cm length Viabahn stent was then deployed in the SFA bridging about 1 cm into the newly placed 5 mm stent in the popliteal artery.  These were postdilated with 4 mm balloon distally and 5 mm balloon proximally.  Completion imaging was then performed.  There is extremely sluggish flow and imaging proximally showed thrombus still in the common femoral artery and proximal profunda femoris artery.  The SFA stent still had minimal flow.  I then redirected the 0.018 advantage wire down the right profunda femoris artery.  4 passes were made with the [place] Rex device to perform mechanical thrombectomy in the right common femoral artery and profunda femoris artery.  This did result in restoration of flow to the profunda  femoris artery with no significant residual disease in the profunda femoris or common femoral arteries.  The SFA still had extremely sluggish flow and I did not feel there was going to be much more we can do to restore flow distally despite our best efforts. The sheath was removed and StarClose closure device was deployed in the left femoral artery with excellent hemostatic result. The patient was taken to the recovery room in stable condition having tolerated the procedure well.  Findings:                           Right Lower Extremity:  This demonstrated thrombus in the common femoral artery and proximal profunda femoris artery, the SFA and popliteal stents were all occluded.  There was extremely sluggish flow distally.  I advanced a Kumpe catheter down to the tibioperoneal trunk where thrombosis of the right peroneal artery and posterior tibial arteries remain thrombosed with no flow distally.   Disposition: Patient was taken to the recovery room  in stable condition having tolerated the procedure well.  Complications: None  [name] 04/25/2023 1:08 PM   This note was created with Dragon Medical transcription system. Any errors in dictation are purely unintentional.

## 2023-05-24 NOTE — Progress Notes (Signed)
 [facility]  ROUNDING NOTE   Subjective:  Anthony Hess is a 68 year old male with past medical conditions including hypertension, PVD, dyslipidemia, CAD, four-vessel CABG, and end-stage renal disease on hemodialysis.    Patient is known to our practice and receives outpatient dialysis at [facility] on a TTS schedule followed by Dr. Cherylann Ratel.  Update: Patient seen and evaluated bedside in the critical care unit. Still having some right lower extremity pain. Due for dialysis treatment again tomorrow.  Objective:  Vital signs in last 24 hours:  Temp:  [98.4 F (36.9 C)-99.7 F (37.6 C)] 98.6 F (37 C) (03/03 0400) Pulse Rate:  [0-97] 74 (03/03 0700) Resp:  [8-29] 14 (03/03 0700) BP: (114-200)/(82-162) 119/82 (03/03 0700) SpO2:  [82 %-100 %] 95 % (03/03 0700)  Weight change:  Filed Weights   04/23/23 0413 04/23/23 1032 04/23/23 1312  Weight: 53 kg 55.9 kg 55.4 kg    Intake/Output: I/O last 3 completed shifts: In: 845.1 [P.O.:240; I.V.:605.1] Out: -    Intake/Output this shift:  No intake/output data recorded.  Physical Exam: General: NAD  Head: Normocephalic, atraumatic. Moist oral mucosal membranes  Eyes: Anicteric  Neck: Supple, trachea midline  Lungs:  Clear to auscultation  Heart: Regular rate and rhythm  Abdomen:  Soft, nontender, bowel sounds present  Extremities: No peripheral edema.  Right lower extremity painful to touch  Neurologic: Nonfocal, moving all four extremities  Skin: No lesions  Access: Left chest permcath    Basic Metabolic Panel: Recent Labs  Lab 04/23/23 0420 04/23/23 1415 04/23/23 1712 04/24/23 0544  NA 139 135  --  136  K 5.1 3.8  --  5.1  CL 97* 95*  --  95*  CO2 15* 21*  --  23  GLUCOSE 53* 95  --  99  BUN 49* 29*  --  45*  CREATININE 7.28* 4.65*  --  7.15*  CALCIUM 7.9* 8.4*  --  8.3*  PHOS  --   --  7.3*  --     Liver Function Tests: Recent Labs  Lab 04/23/23 1415 04/24/23 0544  AST 38 40  ALT 22 21   ALKPHOS 76 66  BILITOT 0.7 0.4  PROT 8.1 8.1  ALBUMIN 3.9 3.7   No results for input(s): "LIPASE", "AMYLASE" in the last 168 hours. No results for input(s): "AMMONIA" in the last 168 hours.  CBC: Recent Labs  Lab 04/23/23 0420 04/23/23 1415 04/24/23 0544 04/24/23 1138 04/24/23 1626 04/24/23 2241 04/25/23 0455  WBC 14.3* 13.3* 12.1* 11.8* 12.0* 13.8* 12.0*  NEUTROABS 11.1* 10.1*  --   --   --   --   --   HGB 13.6 12.9* 13.6 13.3 12.7* 12.6* 12.6*  HCT 46.8 42.6 44.8 44.2 41.9 41.6 41.2  MCV 91.2 87.3 88.9 89.1 87.5 87.6 89.0  PLT 272 281 279 286 281 262 245    Cardiac Enzymes: No results for input(s): "CKTOTAL", "CKMB", "CKMBINDEX", "TROPONINI" in the last 168 hours.  BNP: Invalid input(s): "POCBNP"  CBG: Recent Labs  Lab 04/24/23 1128 04/24/23 1555 04/24/23 1933 04/24/23 2345 04/25/23 0724  GLUCAP 75 118* 130* 114* 88    Microbiology: Results for orders placed or performed during the hospital encounter of 04/23/23  MRSA Next Gen by PCR, Nasal     Status: None   Collection Time: 04/24/23 11:34 AM   Specimen: Nasal Mucosa; Nasal Swab  Result Value Ref Range Status   MRSA by PCR Next Gen NOT DETECTED NOT DETECTED Final  Comment: (NOTE) The GeneXpert MRSA Assay (FDA approved for NASAL specimens only), is one component of a comprehensive MRSA colonization surveillance program. It is not intended to diagnose MRSA infection nor to guide or monitor treatment for MRSA infections. Test performance is not FDA approved in patients less than 6 years old. Performed at [facility], [address] [place], [place] [address]     Coagulation Studies: Recent Labs    04/23/23 0510  LABPROT 14.4  INR 1.1    Urinalysis: No results for input(s): "COLORURINE", "LABSPEC", "PHURINE", "GLUCOSEU", "HGBUR", "BILIRUBINUR", "KETONESUR", "PROTEINUR", "UROBILINOGEN", "NITRITE", "LEUKOCYTESUR" in the last 72 hours.  Invalid input(s): "APPERANCEUR"     Imaging: PERIPHERAL VASCULAR CATHETERIZATION Result Date: 04/24/2023 See surgical note for result.    Medications:    sodium chloride Stopped (04/24/23 1307)   alteplase (LIMB ISCHEMIA) 10 mg in normal saline (0.02 mg/mL) infusion Stopped (04/25/23 0233)   esmolol     heparin 600 Units/hr (04/25/23 0700)    atorvastatin  20 mg Oral Daily   calcium acetate  2,001 mg Oral TID WC   Chlorhexidine Gluconate Cloth  6 each Topical Q0600   fentaNYL (SUBLIMAZE) injection  50 mcg Intravenous Once   nicotine  14 mg Transdermal Daily   pantoprazole  40 mg Oral BID AC   sodium chloride flush  3 mL Intravenous Q12H   sodium chloride, HYDROmorphone (DILAUDID) injection, ondansetron **OR** ondansetron (ZOFRAN) IV, oxyCODONE, sodium chloride flush  Assessment/ Plan:  [name] is a 68 y.o.  male [name] is a 68 y.o.  male with past medical conditions ESRD on HD T/T/S, hypertension, dyslipidemia, CAD, four-vessel CABG, COPD and peripheral vascular disease presenting with critical limb ischemia.     End stage renal disease on hemodialysis T/TH/S.  Patient did undergo shortening treatment on Saturday.  No acute indication for dialysis treatment today.  Will plan for hemodialysis treatment again tomorrow.  CBC exchange 04/24/2023.  2. Anemia of chronic kidney disease Recent Labs           Lab Results  Component Value Date    HGB 12.0 04/24/2023    No immediate need for Mircera.   3. Secondary Hyperparathyroidism:      Recent Labs           Lab Results  Component Value Date    PTH 170 (H) 06/25/2022    CALCIUM 8.3 (L) 04/24/2023    CAION 0.82 (LL) 10/13/2022     Phos  7.3 (H)  04/23/2023      Outpatient labs 2/13 phos 15, PTH 321 Will resume home Calcium acetate 3 tab PO TID with meals.  Monitor serum calcium closely.   4.  Peripheral vascular disease S/p angioplasty of right leg with stent placement on 12/23/2022. Recent thrombectomy and stent placement with  vascular 03/07/2023. Still smoking 1/2 PPD Patient status post mechanical thrombectomy of the right SFA and popliteal arteries.   LOS: 1 Anthony Hess 3/3/20258:12 AM

## 2023-05-24 NOTE — Progress Notes (Signed)
 PT Cancellation Note  Patient Details Name: Anthony Hess MRN: [id number] DOB: Nov 14, 1955   Cancelled Treatment:    Reason Eval/Treat Not Completed: Patient at procedure or test/unavailable (PT to follow up as appropriate)  Donna Bernard, PT, MPT  Ina Homes 04/25/2023, 12:55 PM

## 2023-05-24 NOTE — Plan of Care (Signed)

## 2023-05-24 NOTE — Death Summary Note (Signed)
   DEATH SUMMARY   Patient Details  Name: Anthony Hess MRN: [id number] DOB: 1955-09-14 [id number], [name], MD Admission/Discharge Information   Admit Date:  2023/05/18  Date of Death: Date of Death: 05-20-23  Time of Death: Time of Death: May 26, 2333  Length of Stay: 2   Principle Cause of death: ventricular fibrillation   Hospital Diagnoses: Principal Problem:   Critical limb ischemia of both lower extremities (HCC) Active Problems:   Limb ischemia   Critical limb ischemia of right lower extremity (HCC)   (HFpEF) heart failure with preserved ejection fraction (HCC)   COPD (chronic obstructive pulmonary disease) (HCC)   Essential hypertension   Atrial fibrillation, chronic (HCC)   Tobacco dependence   GERD without esophagitis   End stage renal disease (HCC)   Hyperlipidemia, mixed   Hospital Course: Johncarlo Maalouf was a 67 y.o. male with medical history significant of hypertension, atrial fibrillation on Eliquis, ESRD on hemodialysis TTS through right tunneled IJ catheter, COPD, peripheral vascular disease presenting with critical limb ischemia.  Patient noted had multiple admissions with similar issues in the past including admission October 2024 for limb ischemia status post angiogram angioplasty and stent placement.  Also with Recent thrombectomy and stent placement w/ vascular surgery 03/07/2023.   On Eliquis.  Reports compliance with medication.  Smoking 1/2 pack/day.     Presented to the ER afebrile, hemodynamically stable.  Satting well on room air.  White count 14.3, hemoglobin 13.6, platelets 272, lactate 1.8, creatinine 7.3. RLE with overt skin mottling and discoloration.    They were initially treated with heparin infusion. Vascular surgery was consulted. Angiogram 3/2.  Attempted angiogram again 3/3 but unable to proceed due to hyperkalemia.  He was treated with calcium gluconate several doses as well as albuterol, lokelma, and started on dextrose in effort to also  use insulin to lower intravascular potassium while awaiting emergent dialysis session. Unfortunately, his potassium continued to rise in the setting of his ischemic limb and ESRD. This ultimately led to cardiac instability.   A code was run overnight as documented in separate note from provider who ran the code. He did not survive.   Procedures: angiogram, CPR, intubation during CPR, HD  Consultations: CCM, vascular surgery, nephrology   The results of significant diagnostics from this hospitalization (including imaging, microbiology, ancillary and laboratory) are listed below for reference.   Significant Diagnostic Studies: PERIPHERAL VASCULAR CATHETERIZATION Result Date: 05/20/23 See surgical note for result.  PERIPHERAL VASCULAR CATHETERIZATION Result Date: 04/24/2023 See surgical note for result.   Microbiology: Recent Results (from the past 240 hours)  MRSA Next Gen by PCR, Nasal     Status: None   Collection Time: 04/24/23 11:34 AM   Specimen: Nasal Mucosa; Nasal Swab  Result Value Ref Range Status   MRSA by PCR Next Gen NOT DETECTED NOT DETECTED Final    Comment: (NOTE) The GeneXpert MRSA Assay (FDA approved for NASAL specimens only), is one component of a comprehensive MRSA colonization surveillance program. It is not intended to diagnose MRSA infection nor to guide or monitor treatment for MRSA infections. Test performance is not FDA approved in patients less than 32 years old. Performed at [facility], [address] [place], [place] [address]     Time spent: 35 minutes  Signed: Leeroy Bock, MD 04/30/2023

## 2023-05-24 NOTE — Procedures (Signed)
 INTUBATION PROCEDURE NOTE  Thaniel Coluccio  [id number]  01-12-1956  Date:04/25/23  Time:11:55 PM   Provider Performing:Maudy Yonan A Jordann Grime   Procedure: Intubation (31500)  Indication(s) Respiratory Failure  Consent Unable to obtain consent due to emergent nature of procedure.  Anesthesia Etomidate, Fentanyl, and Rocuronium  Time Out Verified patient identification, verified procedure, site/side was marked, verified correct patient position, special equipment/implants available, medications/allergies/relevant history reviewed, required imaging and test results available.  Sterile Technique Usual hand hygeine, masks, and gloves were used  Procedure Description Patient positioned in bed supine.  Sedation given as noted above.  Patient was intubated with endotracheal tube using Glidescope.  View was Grade 1 full glottis .  Number of attempts was 1.  Colorimetric CO2 detector was consistent with tracheal placement.  Complications/Tolerance None; patient tolerated the procedure well. Chest X-ray is ordered to verify placement.  EBL Minimal  Specimen(s) None   Webb Silversmith, DNP, CCRN, FNP-C, AGACNP-BC Acute Care & Family Nurse Practitioner  [facility]  See Amion for personal pager PCCM on call pager [phone] until 7 am

## 2023-05-24 NOTE — Anesthesia Preprocedure Evaluation (Signed)
 Anesthesia Evaluation  Patient identified by MRN, date of birth, ID band Patient awake  General Assessment Comment:  Patient with perforated viscus, free air on CT in abdomen. Potassium = 5.9. S/p calcium gluconate and sodium bicarbonate administration.  Reviewed: Allergy & Precautions, NPO status , Patient's Chart, lab work & pertinent test results, reviewed documented beta blocker date and time   History of Anesthesia Complications Negative for: history of anesthetic complications  Airway Mallampati: III  TM Distance: >3 FB Neck ROM: full    Dental  (+) Poor Dentition, Missing, Edentulous Upper, Chipped, Dental Advidsory Given   Pulmonary shortness of breath and with exertion, COPD, neg recent URI, Current Smoker and Patient abstained from smoking., former smoker, PE   Pulmonary exam normal breath sounds clear to auscultation       Cardiovascular Exercise Tolerance: Good hypertension, Pt. on medications and Pt. on home beta blockers (-) angina + CAD, + Past MI, + CABG, + Peripheral Vascular Disease and +CHF  (-) Cardiac Stents Normal cardiovascular exam+ dysrhythmias Atrial Fibrillation (-) Valvular Problems/Murmurs Rhythm:Regular Rate:Normal  Echo 11/24: 1. Left ventricular ejection fraction, by estimation, is 50 to 55%. The left ventricle has low normal function. The left ventricle has no regional wall motion abnormalities. There is moderate left ventricular hypertrophy. Left ventricular diastolic parameters were normal.   2. Right ventricular systolic function is normal. The right ventricular size is normal. There is normal pulmonary artery systolic pressure.   3. The mitral valve is normal in structure. Mild mitral valve regurgitation. No evidence of mitral stenosis.   4. The aortic valve is normal in structure. Aortic valve regurgitation is not visualized. Mild aortic valve stenosis.   5. The inferior vena cava is normal in size  with greater than 50% respiratory variability, suggesting right atrial pressure of 3 mmHg.    Neuro/Psych negative neurological ROS  negative psych ROS   GI/Hepatic Neg liver ROS,GERD  Controlled and Medicated,,  Endo/Other  negative endocrine ROS    Renal/GU ESRF and DialysisRenal diseaseLast dialyzed saturday     Musculoskeletal   Abdominal   Peds  Hematology  (+) Blood dyscrasia, anemia   Anesthesia Other Findings Pending K level, last K from 3/2 was borderline high at 5.1.  Will need to see prior to proceeding with GA secondary to increased risk of arrhythmia with hyperkalemia.  Past Medical History: No date: Aortic atherosclerosis (HCC) No date: Bilateral carotid artery disease (HCC) No date: Bladder cancer (HCC) 12/20/2018: Coronary artery disease     Comment:  a.) LHC 12/20/2018: 50% OM1, 40% OM2, 95% o-pLAD, 75%               o=pLCx, 40% mLM, 70% D1, 60% mRCA-1, 50% mRCA-2; refer to              CVTS. b.) 4v CABG at [facility] on 12/27/2018: LIMA-LAD,               RIMA-PDA, seg LRA-OM1-D1 12/05/2018: DCM (dilated cardiomyopathy) (HCC)     Comment:  a.) TTE 12/05/2018: EF 40-45%. b.) TTE 12/28/2019: EF               20-25%. No date: ESRD (end stage renal disease) (HCC)     Comment:  a.) T-Th-Sat 12/05/2018: HFrEF (heart failure with reduced ejection fraction) (HCC)     Comment:  a.) TTE 12/05/2018: EF 40-45%; mild LVH; ant/apical/sep               HK; mild TR . b.)  TTE 12/28/2019: EF 20-25%; mod LVH; mod              MR/AR; G1DD. 04/08/2021: History of 2019 novel coronavirus disease (COVID-19) No date: History of kidney stones No date: HLD (hyperlipidemia) No date: Hypertension 03/05/2021: Infrarenal abdominal aortic aneurysm (AAA) without  rupture (HCC)     Comment:  a.) CT abd/pelvis; measured 3.2 cm. No date: Myocardial infarction [facility]) No date: PVD (peripheral vascular disease) (HCC) 12/27/2018: S/P CABG x 4     Comment:  a.) LIMA-LAD, RIMA-PDA, sequential  LEFT radial artery to              OM1 and D1 03/14/2021: Sepsis (HCC) No date: Wears glasses  Past Surgical History: 07/30/2021: AV FISTULA PLACEMENT; Left     Comment:  Procedure: INSERTION OF ARTERIOVENOUS (AV) GORE-TEX               GRAFT ARM BRACHIAL ARTERY TO AXILLARY VEIN;  Surgeon:               Annice Needy, MD;  Location: [facility] ORS;  Service:               Vascular;  Laterality: Left; 12/31/2019: CAPD INSERTION; N/A     Comment:  Procedure: LAPAROSCOPIC INSERTION CONTINUOUS AMBULATORY               PERITONEAL DIALYSIS  (CAPD) CATHETER;  Surgeon: Leafy Ro, MD;  Location: [facility] ORS;  Service: General;                Laterality: N/A; 04/10/2020: CAPD REMOVAL; N/A     Comment:  Procedure: LAPAROSCOPIC REVISION OF CONTINUOUS               AMBULATORY PERITONEAL DIALYSIS  (CAPD) CATHETER;                Surgeon: Leafy Ro, MD;  Location: [facility] ORS;                Service: General;  Laterality: N/A; 12/27/2018: CORONARY ARTERY BYPASS GRAFT; N/A     Comment:  Procedure: CORONARY ARTERY BYPASS GRAFTING (CABG) X 4 ON              PUMP USING RIGHT & LEFT INTERNAL MAMMARY ARTERY LEFT               RADIAL ARTERY ENDOSCOPICALLY HARVESTED;  Surgeon: Linden Dolin, MD;  Location: [facility];  Service: Open Heart               Surgery;  Laterality: N/A; 05/15/2019: CYSTOSCOPY W/ RETROGRADES; Bilateral     Comment:  Procedure: CYSTOSCOPY WITH RETROGRADE PYELOGRAM;                Surgeon: Riki Altes, MD;  Location: [facility] ORS;                Service: Urology;  Laterality: Bilateral; 05/15/2019: CYSTOSCOPY WITH BIOPSY; N/A     Comment:  Procedure: CYSTOSCOPY WITH bladder BIOPSY;  Surgeon:               Riki Altes, MD;  Location: [facility] ORS;  Service:               Urology;  Laterality: N/A; 12/28/2019: DIALYSIS/PERMA CATHETER INSERTION; N/A     Comment:  Procedure: DIALYSIS/PERMA CATHETER INSERTION;  Surgeon:               Annice Needy, MD;  Location: [facility]  INVASIVE CV LAB;                Service: Cardiovascular;  Laterality: N/A; 03/18/2021: DIALYSIS/PERMA CATHETER INSERTION; N/A     Comment:  Procedure: DIALYSIS/PERMA CATHETER INSERTION;  Surgeon:               Annice Needy, MD;  Location: [facility] INVASIVE CV LAB;                Service: Cardiovascular;  Laterality: N/A; 06/02/2020: DIALYSIS/PERMA CATHETER REMOVAL; N/A     Comment:  Procedure: DIALYSIS/PERMA CATHETER REMOVAL;  Surgeon:               Annice Needy, MD;  Location: [facility] INVASIVE CV LAB;                Service: Cardiovascular;  Laterality: N/A; 04/10/2020: EXCHANGE OF A DIALYSIS CATHETER; Right     Comment:  Procedure: EXCHANGE OF A DIALYSIS CATHETER;  Surgeon:               Leafy Ro, MD;  Location: [facility] ORS;  Service:               General;  Laterality: Right; 01/20/2021: INCISIONAL HERNIA REPAIR     Comment:  Procedure: HERNIA REPAIR INCISIONAL;  Surgeon: Henrene Dodge, MD;  Location: [facility] ORS;  Service: General;; 04/07/2020: IR IMAGE GUIDED DRAINAGE PERCUT CATH  PERITONEAL RETROPERIT 04/16/2021: LAPAROSCOPY; N/A     Comment:  Procedure: LAPAROSCOPY DIAGNOSTIC;  Surgeon: Sung Amabile, DO;  Location: [facility] ORS;  Service: General;                Laterality: N/A; 12/20/2018: LEFT HEART CATH AND CORONARY ANGIOGRAPHY; Left     Comment:  Procedure: LEFT HEART CATH AND CORONARY ANGIOGRAPHY;                Surgeon: Marcina Millard, MD;  Location: [facility]               INVASIVE CV LAB;  Service: Cardiovascular;  Laterality:               Left; 12/27/2018: RADIAL ARTERY HARVEST; Left     Comment:  Procedure: ENDOSCOPIC RADIAL ARTERY HARVEST;  Surgeon:               Linden Dolin, MD;  Location: [facility];  Service: Open               Heart Surgery;  Laterality: Left; 03/20/2021: REMOVAL OF A DIALYSIS CATHETER; Left     Comment:  Procedure: REMOVAL OF A PD CATHETER;  Surgeon: Annice Needy, MD;  Location: [facility] ORS;  Service: Vascular;                 Laterality: Left; 12/27/2018: TEE WITHOUT CARDIOVERSION; N/A     Comment:  Procedure: TRANSESOPHAGEAL ECHOCARDIOGRAM (TEE);                Surgeon: Linden Dolin, MD;  Location: [facility];  Service: Open Heart Surgery;  Laterality: N/A; 2019: TUMOR REMOVAL     Comment:  Bladder  BMI    Body Mass Index: 22.50 kg/m      Reproductive/Obstetrics negative OB ROS                             Anesthesia Physical Anesthesia Plan  ASA: 4  Anesthesia Plan: General   Post-op Pain Management: Minimal or no pain anticipated   Induction: Intravenous  PONV Risk Score and Plan: 3 and Ondansetron, Dexamethasone and Treatment may vary due to age or medical condition  Airway Management Planned: LMA  Additional Equipment: None  Intra-op Plan:   Post-operative Plan:   Informed Consent: I have reviewed the patients History and Physical, chart, labs and discussed the procedure including the risks, benefits and alternatives for the proposed anesthesia with the patient or authorized representative who has indicated his/her understanding and acceptance.     Dental advisory given  Plan Discussed with: CRNA and Surgeon  Anesthesia Plan Comments: (Discussed risks of anesthesia with patient, including possibility of difficulty with spontaneous ventilation under anesthesia necessitating airway intervention, PONV, and rare risks such as cardiac or respiratory or neurological events, and allergic reactions. Discussed the role of CRNA in patient's perioperative care. Patient understands.)       Anesthesia Quick Evaluation

## 2023-05-24 NOTE — OR Nursing (Addendum)
 K 6.3. Dr Wyn Quaker notified

## 2023-05-24 NOTE — Progress Notes (Signed)
 OT Cancellation Note  Patient Details Name: Anthony Hess MRN: [id number] DOB: Nov 06, 1955   Cancelled Treatment:    Reason Eval/Treat Not Completed: Active bedrest order. Order received, chart reviewed. Noted bedrest orders with plan for vascular procedure today. Will hold and initiate services as pt readies.   Glenard Haring M.S. OTR/L  04/25/23, 8:49 AM

## 2023-05-24 NOTE — Progress Notes (Signed)
 PROGRESS NOTE  Anthony Hess    DOB: 07/12/1955, 68 y.o.  ZOX:[id number]    Code Status: Full Code   DOA: 04/23/2023   LOS: 1   Brief hospital course  Anthony Hess is a 68 y.o. male with medical history significant of hypertension, atrial fibrillation on Eliquis, ESRD on hemodialysis TTS through right tunneled IJ catheter, COPD, peripheral vascular disease presenting with critical limb ischemia.  Patient noted had multiple admissions with similar issues in the past including admission October 2024 for limb ischemia status post angiogram angioplasty and stent placement.  Also with Recent thrombectomy and stent placement w/ vascular surgery 03/07/2023.   On Eliquis.  Reports compliance with medication.  Smoking 1/2 pack/day.    Presented to the ER afebrile, hemodynamically stable.  Satting well on room air.  White count 14.3, hemoglobin 13.6, platelets 272, lactate 1.8, creatinine 7.3. RLE with overt skin mottling and discoloration.   They were initially treated with heparin infusion. Vascular surgery was consulted. Angiogram 3/2.   04/25/23 -attempted another angiogram today but hyperkalemic. Treated medically but K+ continued to worsen. Planning HD today as well.   Assessment & Plan  Principal Problem:   Critical limb ischemia of both lower extremities (HCC) Active Problems:   Limb ischemia   Critical limb ischemia of right lower extremity (HCC)   (HFpEF) heart failure with preserved ejection fraction (HCC)   COPD (chronic obstructive pulmonary disease) (HCC)   Essential hypertension   Atrial fibrillation, chronic (HCC)   Tobacco dependence   GERD without esophagitis   End stage renal disease (HCC)   Hyperlipidemia, mixed  Critical limb ischemia of right lower extremity (HCC) PAD w/ claudication  Noted eval for limb ischemia status post angiogram with angioplasty and stent placement 12/23/2022  Recent thrombectomy and stent placement w/ vascular surgery 03/07/2023  Still smoking 1/2  PPD- discussed contraindication and cessation  Continue heparin gtt Vascular surgery consulted - s/p angiogram 3/2, attempt again 3/3 but unable to proceed due to hyperkalemia. Unable to reduce with medical treatment. Will get HD today.  Will need to proceed with AKA, per vascular surgery. Probably 3/5 Follow up recommendations vascular surgery  On esmolol  - alteplase infusion discontinued early morning due to patient removal of femoral sheath  Hyperkalemia- K+ 5.3. given lokelma, albuterol, and given dextrose so insulin could be given. Two doses of calcium. No ecg changes. Despite these treatments, K+ continued to rise up to 7.4. nephrology notified and will do HD today.  - follow q4hr  End stage renal disease (HCC)- vascular surgery replaced tunneled cath 3/2 during procedure for LE.  On HD TTS  Nephrology following   COPD - on 2Lnc and stable - wean O2 as tolerated - continue resp traetments    HFpEF- 2D ECHO 12/2022 w/ EF 55%  Monitor volume status  Cont home regimen    Essential hypertension Cont home regimen    Tobacco dependence 1/2 PPD smoker  Discussed cessation at length  Nicotine patch     Atrial fibrillation, chronic (HCC) Rate controlled at present  Transitioned from eliquis to heparin gtt in setting of limb ischemia  Monitor   GERD without esophagitis PPI   HLD  CAD  PAD-  Baseline hx/o CAD s/p CABG  No active CP  Cont home regimen including asa and statin   Body mass index is 22.34 kg/m.  VTE ppx: heparin gtt   Diet:     Diet   Diet NPO time specified   Consultants: Nephrology  Vascular surgery   Subjective 04/25/23    Pt reports continued leg pain. No SOB.   Objective   Vitals:   04/25/23 0500 04/25/23 0512 04/25/23 0600 04/25/23 0700  BP: (!) 152/117 (!) 154/91 (!) 145/98 119/82  Pulse: 75 75 73 74  Resp: (!) 29 19 13 14   Temp:      TempSrc:      SpO2: 96% 96% 95% 95%  Weight:      Height:        Intake/Output Summary  (Last 24 hours) at 04/25/2023 0813 Last data filed at 04/25/2023 0700 Gross per 24 hour  Intake 488.72 ml  Output --  Net 488.72 ml   Filed Weights   04/23/23 0413 04/23/23 1032 04/23/23 1312  Weight: 53 kg 55.9 kg 55.4 kg     Physical Exam:  General: awake, alert, NAD HEENT: atraumatic, clear conjunctiva, anicteric sclera, MMM, hearing grossly normal Respiratory: normal respiratory effort. Cardiovascular: quick capillary refill, normal S1/S2, RRR, no JVD, murmurs Gastrointestinal: soft, NT, ND Nervous: A&O x3. no gross focal neurologic deficits, normal speech Extremities: RLE mottled. Tender to palpation and cold. Decreased cap refill Psychiatry: normal mood, congruent affect  Labs   I have personally reviewed the following labs and imaging studies CBC    Component Value Date/Time   WBC 12.0 (H) 04/25/2023 0455   RBC 4.63 04/25/2023 0455   HGB 12.6 (L) 04/25/2023 0455   HGB 9.1 (L) 01/04/2020 1540   HCT 41.2 04/25/2023 0455   HCT 29.6 (L) 01/04/2020 1540   PLT 245 04/25/2023 0455   PLT 191 01/04/2020 1540   MCV 89.0 04/25/2023 0455   MCV 94 01/04/2020 1540   MCH 27.2 04/25/2023 0455   MCHC 30.6 04/25/2023 0455   RDW 20.7 (H) 04/25/2023 0455   RDW 16.2 (H) 01/04/2020 1540   LYMPHSABS 1.2 04/23/2023 1415   LYMPHSABS 1.0 01/04/2020 1540   MONOABS 1.8 (H) 04/23/2023 1415   EOSABS 0.1 04/23/2023 1415   EOSABS 0.5 (H) 03/27/2018 1643   BASOSABS 0.1 04/23/2023 1415   BASOSABS 0.0 03/27/2018 1643      Latest Ref Rng & Units 04/24/2023    5:44 AM 04/23/2023    2:15 PM 04/23/2023    4:20 AM  BMP  Glucose 70 - 99 mg/dL 99  95  53   BUN 8 - 23 mg/dL 45  29  49   Creatinine 0.61 - 1.24 mg/dL 1.61  0.96  0.45   Sodium 135 - 145 mmol/L 136  135  139   Potassium 3.5 - 5.1 mmol/L 5.1  3.8  5.1   Chloride 98 - 111 mmol/L 95  95  97   CO2 22 - 32 mmol/L 23  21  15    Calcium 8.9 - 10.3 mg/dL 8.3  8.4  7.9     PERIPHERAL VASCULAR CATHETERIZATION Result Date: 04/24/2023 See surgical  note for result.   Disposition Plan & Communication  Patient status: Inpatient  Admitted From: Home Planned disposition location: Home Anticipated discharge date: 3/8 pending clinical improvement   Family Communication: none at bedside    Author: Leeroy Bock, DO [facility] 04/25/2023, 8:13 AM   Available by Epic secure chat 7AM-7PM. If 7PM-7AM, please contact night-coverage.  [facility] contact information found on [email or web address].

## 2023-05-24 NOTE — Interval H&P Note (Signed)
 History and Physical Interval Note:  04/25/2023 8:58 AM  Anthony Hess  has presented today for surgery, with the diagnosis of lower extremity intervention.  The various methods of treatment have been discussed with the patient and family. After consideration of risks, benefits and other options for treatment, the patient has consented to  Procedure(s): LOWER EXTREMITY INTERVENTION (N/A) as a surgical intervention.  The patient's history has been reviewed, patient examined, no change in status, stable for surgery.  I have reviewed the patient's chart and labs.  Questions were answered to the patient's satisfaction.     [name]

## 2023-05-24 NOTE — Progress Notes (Signed)
 Patient pulled out femoral sheath. Minimal bleeding from site. Dr. Wyn Quaker notified, site has been cleaned with CHG swabs; bio patch and dressing applied. Per Dr. Wyn Quaker, discontinue alteplase and keep heparin infusion running.

## 2023-05-24 NOTE — OR Nursing (Signed)
 Dr Wyn Quaker notified of CP lab run K+ of 6.5. stat Lab draw potassium order placed for verification and lab notified.

## 2023-05-24 NOTE — Progress Notes (Signed)
 Dialysis was started earlier this evening but had to be terminated early due to development of patient instability.   Will reassess for further HD if patient able to stabilize.   Overall prognosis remains guarded.

## 2023-05-24 DEATH — deceased

## 2023-08-03 IMAGING — CT CT ABD-PELV W/O CM
2 of 4 series · 15 of 46 positions shown, 17 images · non-contrast
Comparison: CT abdomen and pelvis dated April 03, 2018

CLINICAL DATA: Rule out hernia

EXAM:
CT ABDOMEN AND PELVIS WITHOUT CONTRAST
TECHNIQUE: Multidetector CT imaging of the abdomen and pelvis was performed
following the standard protocol without IV contrast.

[Series 2: routine abd/pel wo · axial · 0.74mm/px · z∈[-440,-45]mm · 12 of 87 slices shown, 14 images]
[im 4/87  soft-tissue]
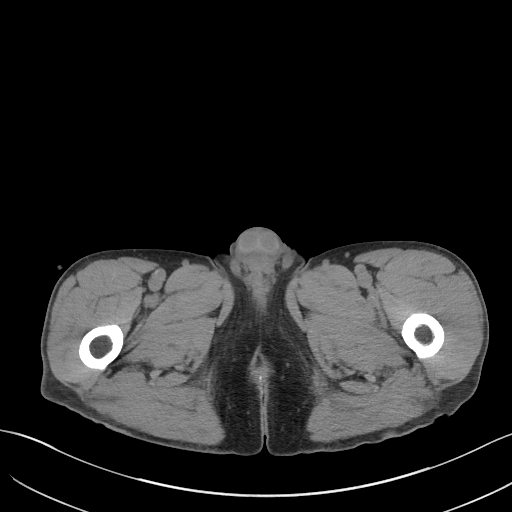
[im 4/87  bone]
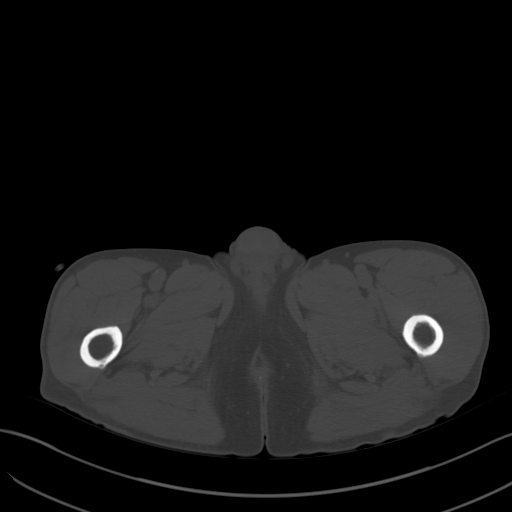
[im 11/87  soft-tissue]
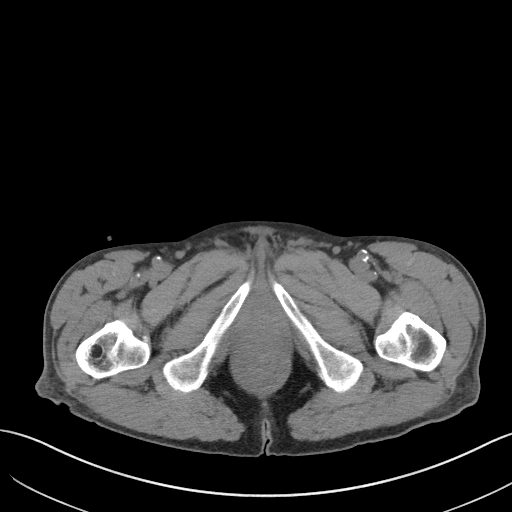
[im 18/87  soft-tissue]
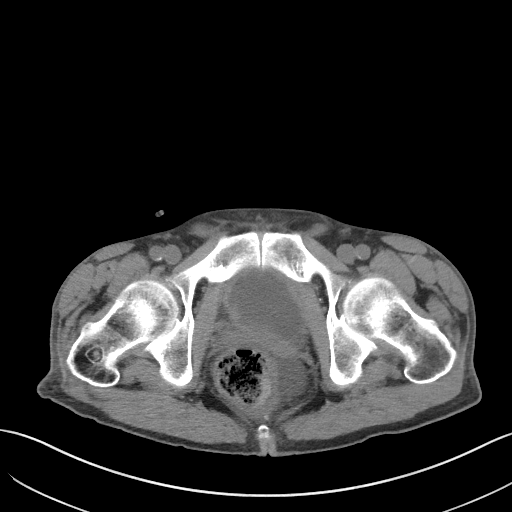
[im 26/87  soft-tissue]
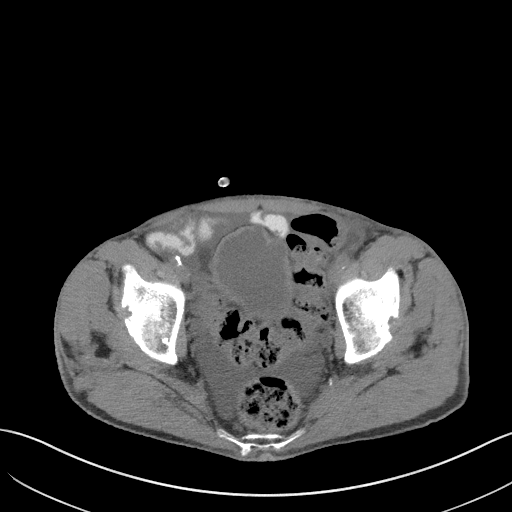
[im 33/87  soft-tissue]
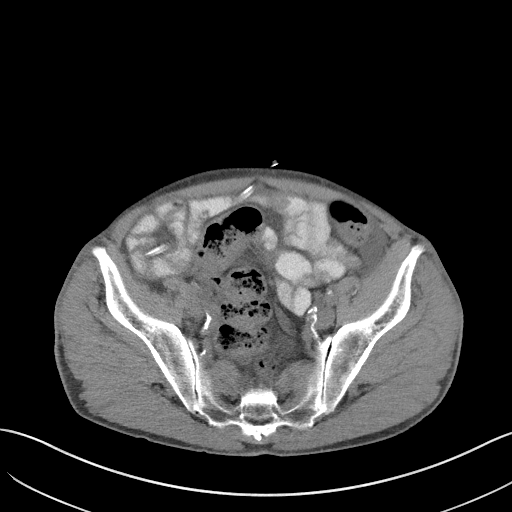
[im 40/87  soft-tissue]
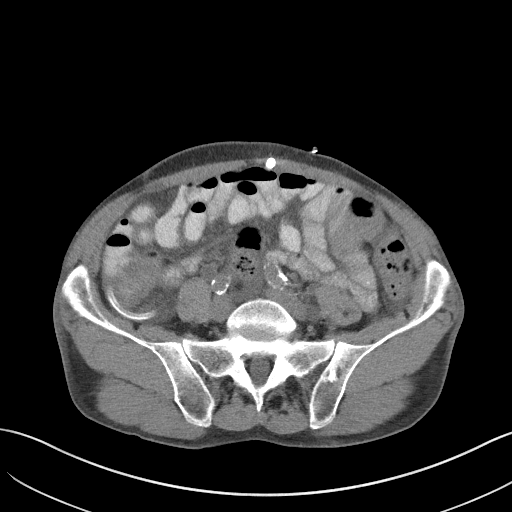
[im 47/87  soft-tissue]
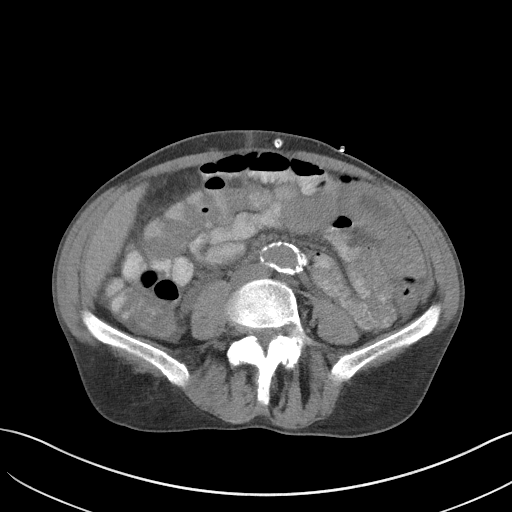
[im 54/87  soft-tissue]
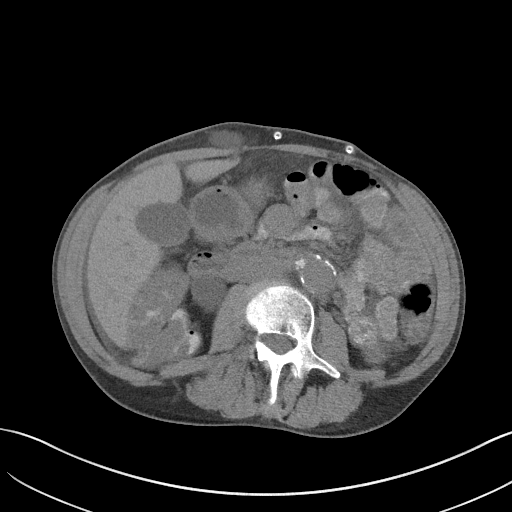
[im 61/87  soft-tissue]
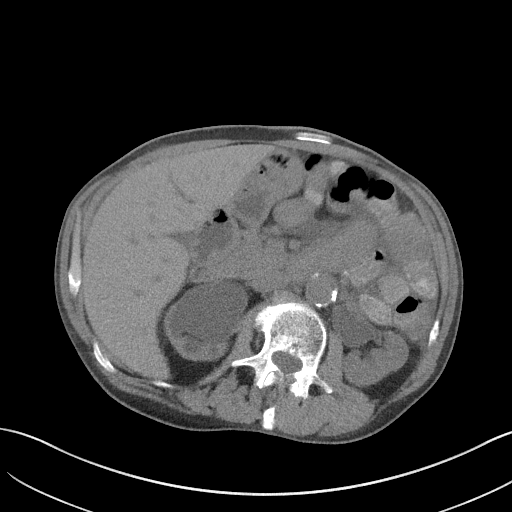
[im 61/87  bone]
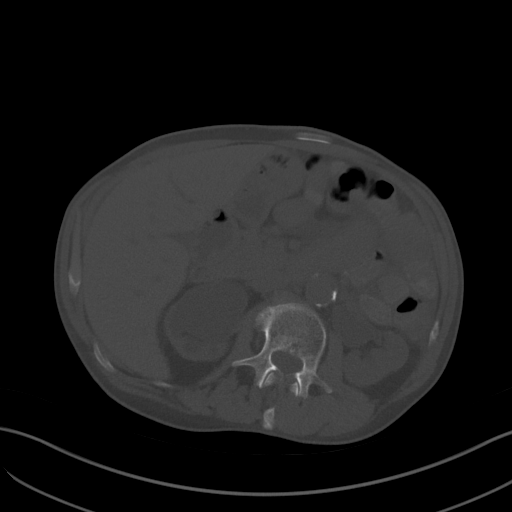
[im 69/87  soft-tissue]
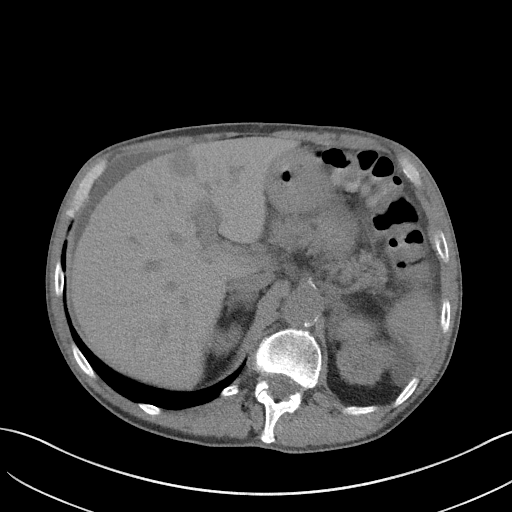
[im 76/87  soft-tissue]
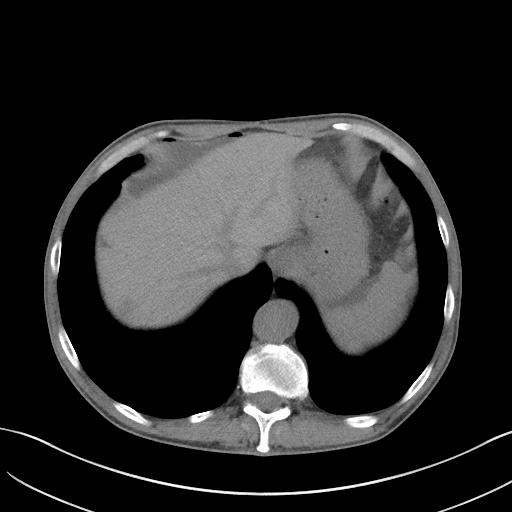
[im 83/87  soft-tissue]
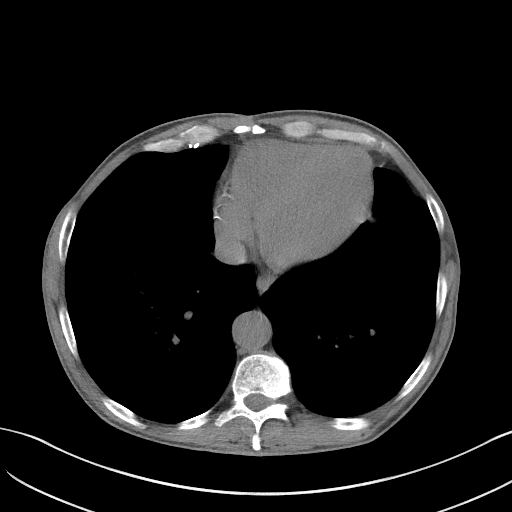

[Series 5: coronal st · coronal · 0.68mm/px · 3 of 88 slices shown]
[im 30/88  soft-tissue]
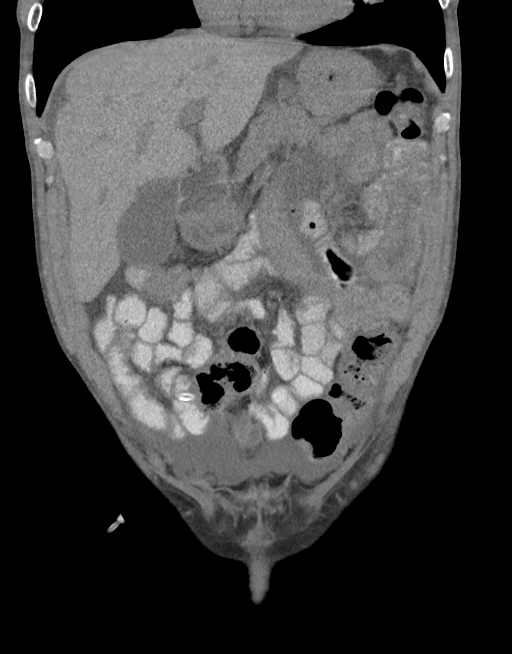
[im 39/88  soft-tissue]
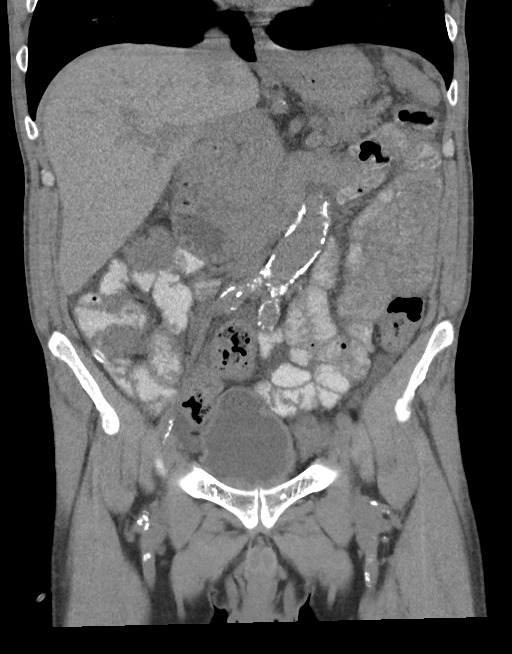
[im 49/88  soft-tissue]
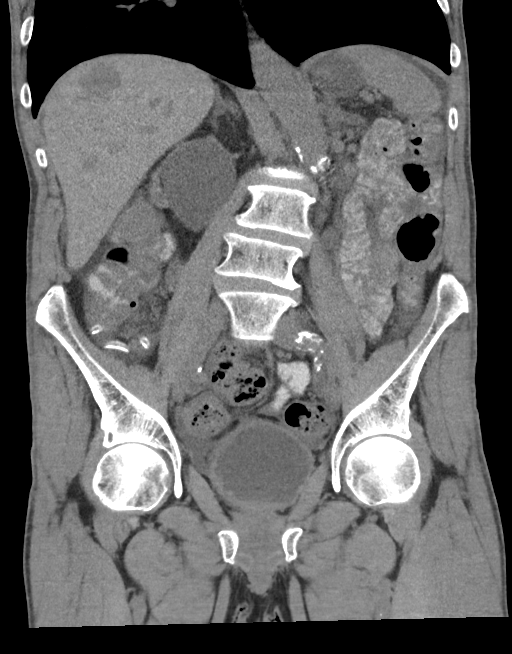

[15 of 46 positions shown; findings below may reference images not displayed]

FINDINGS: Lower chest: No acute abnormality.

Hepatobiliary: Scattered hypodense liver lesions are unchanged
compared to prior exam. No gallstones, gallbladder wall thickening,
or biliary dilatation.

Pancreas: Unremarkable. No pancreatic ductal dilatation or
surrounding inflammatory changes.

Spleen: Normal in size without focal abnormality.

Adrenals/Urinary Tract: Bilateral adrenal glands are unremarkable.
Atrophic native kidneys with right greater than left hydronephrosis
and hydroureter, decreased compared to prior exam. Thick-walled and
trabeculated urinary bladder.

Stomach/Bowel: Stomach is within normal limits. Appendix is not
visualized. No evidence of bowel wall thickening, distention, or
inflammatory changes.

Vascular/Lymphatic: No pathologically enlarged lymph nodes seen in
the abdomen or pelvis. Severe aortoiliac calcifications and tortuous
aorta with no aneurysmal dilation.

Reproductive: Mild prostatomegaly.

Other: Small volume abdominal ascites with few locules of free
intraperitoneal air. Peritoneal dialysis catheter in place which
terminates in the right lower quadrant.Small ventral abdominal wall
hernia, located just right of midline on series 2, image 36,
containing fluid and a small locule of air. Hernia neck measures
approximately 4 mm. Additional small fluid and air containing
ventral abdominal hernia in the more lateral right abdominal wall on
series 2, image 29 which small neck measuring 3 mm on series 2,
image 30.

Musculoskeletal: No acute or significant osseous findings.
IMPRESSION: Small right ventral abdominal wall hernias containing fluid and a
few locules of gas.

Small volume abdominal ascites. Scattered small locules of free
intraperitoneal air, likely related to peritoneal dialysis
technique.

Bilateral hydronephrosis, overall decreased compared to prior exam.
Thick-walled and trabeculated urinary bladder, findings are likely
due to chronic outlet obstruction.

Aortic Atherosclerosis (OLLVY-EL2.2).
